# Patient Record
Sex: Female | Born: 1945
Health system: Southern US, Community
[De-identification: ages and names within clinical notes are randomized; demographics above are authoritative.]

## PROBLEM LIST (undated history)

## (undated) DIAGNOSIS — Z923 Personal history of irradiation: Secondary | ICD-10-CM

## (undated) DIAGNOSIS — C73 Malignant neoplasm of thyroid gland: Secondary | ICD-10-CM

## (undated) DIAGNOSIS — B379 Candidiasis, unspecified: Secondary | ICD-10-CM

## (undated) DIAGNOSIS — Z79899 Other long term (current) drug therapy: Secondary | ICD-10-CM

## (undated) DIAGNOSIS — Z7969 Long term (current) use of other immunomodulators and immunosuppressants: Secondary | ICD-10-CM

## (undated) DIAGNOSIS — K603 Anal fistula, unspecified: Secondary | ICD-10-CM

## (undated) DIAGNOSIS — Z87898 Personal history of other specified conditions: Secondary | ICD-10-CM

## (undated) DIAGNOSIS — C3490 Malignant neoplasm of unspecified part of unspecified bronchus or lung: Secondary | ICD-10-CM

## (undated) DIAGNOSIS — Z8639 Personal history of other endocrine, nutritional and metabolic disease: Secondary | ICD-10-CM

## (undated) DIAGNOSIS — M810 Age-related osteoporosis without current pathological fracture: Secondary | ICD-10-CM

## (undated) DIAGNOSIS — C7931 Secondary malignant neoplasm of brain: Secondary | ICD-10-CM

## (undated) DIAGNOSIS — D34 Benign neoplasm of thyroid gland: Secondary | ICD-10-CM

## (undated) DIAGNOSIS — C3411 Malignant neoplasm of upper lobe, right bronchus or lung: Secondary | ICD-10-CM

## (undated) DIAGNOSIS — R51 Headache: Secondary | ICD-10-CM

## (undated) DIAGNOSIS — Z8619 Personal history of other infectious and parasitic diseases: Secondary | ICD-10-CM

## (undated) DIAGNOSIS — Z9221 Personal history of antineoplastic chemotherapy: Secondary | ICD-10-CM

## (undated) DIAGNOSIS — C779 Secondary and unspecified malignant neoplasm of lymph node, unspecified: Secondary | ICD-10-CM

## (undated) DIAGNOSIS — N952 Postmenopausal atrophic vaginitis: Secondary | ICD-10-CM

## (undated) DIAGNOSIS — R011 Cardiac murmur, unspecified: Secondary | ICD-10-CM

## (undated) DIAGNOSIS — R0602 Shortness of breath: Secondary | ICD-10-CM

## (undated) DIAGNOSIS — M1990 Unspecified osteoarthritis, unspecified site: Secondary | ICD-10-CM

## (undated) DIAGNOSIS — Z8739 Personal history of other diseases of the musculoskeletal system and connective tissue: Secondary | ICD-10-CM

## (undated) DIAGNOSIS — R911 Solitary pulmonary nodule: Secondary | ICD-10-CM

## (undated) DIAGNOSIS — IMO0002 Reserved for concepts with insufficient information to code with codable children: Secondary | ICD-10-CM

## (undated) DIAGNOSIS — C7800 Secondary malignant neoplasm of unspecified lung: Secondary | ICD-10-CM

## (undated) DIAGNOSIS — F419 Anxiety disorder, unspecified: Secondary | ICD-10-CM

## (undated) DIAGNOSIS — F0390 Unspecified dementia without behavioral disturbance: Secondary | ICD-10-CM

## (undated) DIAGNOSIS — H269 Unspecified cataract: Secondary | ICD-10-CM

## (undated) DIAGNOSIS — S82899A Other fracture of unspecified lower leg, initial encounter for closed fracture: Secondary | ICD-10-CM

## (undated) DIAGNOSIS — I10 Essential (primary) hypertension: Secondary | ICD-10-CM

## (undated) HISTORY — DX: Anal fistula, unspecified: K60.30

## (undated) HISTORY — PX: LUNG LOBECTOMY: SHX167

## (undated) HISTORY — DX: Personal history of other diseases of the musculoskeletal system and connective tissue: Z87.39

## (undated) HISTORY — DX: Headache: R51

## (undated) HISTORY — DX: Anal fistula: K60.3

## (undated) HISTORY — DX: Candidiasis, unspecified: B37.9

## (undated) HISTORY — DX: Malignant neoplasm of upper lobe, right bronchus or lung: C34.11

## (undated) HISTORY — PX: NECK SURGERY: SHX720

## (undated) HISTORY — DX: Reserved for concepts with insufficient information to code with codable children: IMO0002

## (undated) HISTORY — DX: Personal history of irradiation: Z92.3

## (undated) HISTORY — PX: BRAIN SURGERY: SHX531

## (undated) HISTORY — DX: Personal history of other specified conditions: Z87.898

## (undated) HISTORY — DX: Postmenopausal atrophic vaginitis: N95.2

## (undated) HISTORY — DX: Personal history of other infectious and parasitic diseases: Z86.19

## (undated) HISTORY — DX: Unspecified dementia without behavioral disturbance: F03.90

## (undated) HISTORY — DX: Personal history of other endocrine, nutritional and metabolic disease: Z86.39

## (undated) HISTORY — DX: Essential (primary) hypertension: I10

## (undated) HISTORY — DX: Age-related osteoporosis without current pathological fracture: M81.0

## (undated) SURGERY — OPEN REDUCTION INTERNAL FIXATION (ORIF) ANKLE FRACTURE
Anesthesia: Choice | Laterality: Left

---

## 1998-02-22 ENCOUNTER — Ambulatory Visit (HOSPITAL_COMMUNITY): Admission: RE | Admit: 1998-02-22 | Discharge: 1998-02-22 | Payer: Self-pay | Admitting: Family Medicine

## 1998-03-09 ENCOUNTER — Ambulatory Visit (HOSPITAL_COMMUNITY): Admission: RE | Admit: 1998-03-09 | Discharge: 1998-03-09 | Payer: Self-pay | Admitting: Family Medicine

## 2000-09-23 ENCOUNTER — Encounter: Admission: RE | Admit: 2000-09-23 | Discharge: 2000-09-23 | Payer: Self-pay | Admitting: General Practice

## 2000-09-23 ENCOUNTER — Encounter: Payer: Self-pay | Admitting: General Practice

## 2001-02-11 ENCOUNTER — Other Ambulatory Visit: Admission: RE | Admit: 2001-02-11 | Discharge: 2001-02-11 | Payer: Self-pay | Admitting: *Deleted

## 2001-02-11 ENCOUNTER — Encounter (INDEPENDENT_AMBULATORY_CARE_PROVIDER_SITE_OTHER): Payer: Self-pay | Admitting: *Deleted

## 2001-02-18 ENCOUNTER — Ambulatory Visit (HOSPITAL_COMMUNITY): Admission: RE | Admit: 2001-02-18 | Discharge: 2001-02-18 | Payer: Self-pay | Admitting: *Deleted

## 2001-02-18 ENCOUNTER — Encounter: Payer: Self-pay | Admitting: *Deleted

## 2001-05-08 ENCOUNTER — Ambulatory Visit (HOSPITAL_COMMUNITY): Admission: RE | Admit: 2001-05-08 | Discharge: 2001-05-08 | Payer: Self-pay | Admitting: Internal Medicine

## 2001-05-08 ENCOUNTER — Encounter: Payer: Self-pay | Admitting: Internal Medicine

## 2001-05-14 ENCOUNTER — Encounter: Payer: Self-pay | Admitting: Internal Medicine

## 2001-05-14 ENCOUNTER — Ambulatory Visit (HOSPITAL_COMMUNITY): Admission: RE | Admit: 2001-05-14 | Discharge: 2001-05-14 | Payer: Self-pay | Admitting: Internal Medicine

## 2001-05-14 ENCOUNTER — Encounter (INDEPENDENT_AMBULATORY_CARE_PROVIDER_SITE_OTHER): Payer: Self-pay | Admitting: Specialist

## 2001-05-20 HISTORY — PX: OTHER SURGICAL HISTORY: SHX169

## 2001-05-27 ENCOUNTER — Encounter: Payer: Self-pay | Admitting: Thoracic Surgery

## 2001-05-29 ENCOUNTER — Encounter (INDEPENDENT_AMBULATORY_CARE_PROVIDER_SITE_OTHER): Payer: Self-pay | Admitting: Specialist

## 2001-05-29 ENCOUNTER — Encounter: Payer: Self-pay | Admitting: Thoracic Surgery

## 2001-05-29 ENCOUNTER — Inpatient Hospital Stay (HOSPITAL_COMMUNITY): Admission: RE | Admit: 2001-05-29 | Discharge: 2001-06-04 | Payer: Self-pay | Admitting: Thoracic Surgery

## 2001-05-30 ENCOUNTER — Encounter: Payer: Self-pay | Admitting: Thoracic Surgery

## 2001-05-31 ENCOUNTER — Encounter: Payer: Self-pay | Admitting: Thoracic Surgery

## 2001-06-01 ENCOUNTER — Encounter: Payer: Self-pay | Admitting: Thoracic Surgery

## 2001-06-02 ENCOUNTER — Encounter: Payer: Self-pay | Admitting: Thoracic Surgery

## 2001-06-03 ENCOUNTER — Encounter: Payer: Self-pay | Admitting: Thoracic Surgery

## 2001-06-04 ENCOUNTER — Encounter: Payer: Self-pay | Admitting: Thoracic Surgery

## 2001-06-05 ENCOUNTER — Ambulatory Visit: Admission: RE | Admit: 2001-06-05 | Discharge: 2001-09-03 | Payer: Self-pay | Admitting: *Deleted

## 2001-06-10 ENCOUNTER — Encounter: Payer: Self-pay | Admitting: Thoracic Surgery

## 2001-06-10 ENCOUNTER — Encounter: Admission: RE | Admit: 2001-06-10 | Discharge: 2001-06-10 | Payer: Self-pay | Admitting: Thoracic Surgery

## 2001-07-01 ENCOUNTER — Encounter: Payer: Self-pay | Admitting: Thoracic Surgery

## 2001-07-01 ENCOUNTER — Encounter: Admission: RE | Admit: 2001-07-01 | Discharge: 2001-07-01 | Payer: Self-pay | Admitting: Thoracic Surgery

## 2001-07-29 ENCOUNTER — Encounter: Admission: RE | Admit: 2001-07-29 | Discharge: 2001-07-29 | Payer: Self-pay | Admitting: Thoracic Surgery

## 2001-07-29 ENCOUNTER — Encounter: Payer: Self-pay | Admitting: Thoracic Surgery

## 2001-08-20 DIAGNOSIS — Z923 Personal history of irradiation: Secondary | ICD-10-CM

## 2001-08-20 DIAGNOSIS — Z9221 Personal history of antineoplastic chemotherapy: Secondary | ICD-10-CM

## 2001-08-20 HISTORY — DX: Personal history of irradiation: Z92.3

## 2001-08-20 HISTORY — DX: Personal history of antineoplastic chemotherapy: Z92.21

## 2001-11-27 ENCOUNTER — Encounter: Payer: Self-pay | Admitting: Oncology

## 2001-11-27 ENCOUNTER — Ambulatory Visit (HOSPITAL_COMMUNITY): Admission: RE | Admit: 2001-11-27 | Discharge: 2001-11-27 | Payer: Self-pay | Admitting: Oncology

## 2002-01-15 ENCOUNTER — Ambulatory Visit (HOSPITAL_COMMUNITY): Admission: RE | Admit: 2002-01-15 | Discharge: 2002-01-15 | Payer: Self-pay | Admitting: Oncology

## 2002-01-15 ENCOUNTER — Encounter: Payer: Self-pay | Admitting: Oncology

## 2002-03-26 ENCOUNTER — Encounter: Payer: Self-pay | Admitting: Thoracic Surgery

## 2002-03-26 ENCOUNTER — Encounter: Admission: RE | Admit: 2002-03-26 | Discharge: 2002-03-26 | Payer: Self-pay | Admitting: Thoracic Surgery

## 2002-07-10 ENCOUNTER — Encounter: Admission: RE | Admit: 2002-07-10 | Discharge: 2002-07-10 | Payer: Self-pay | Admitting: Internal Medicine

## 2002-07-10 ENCOUNTER — Encounter: Payer: Self-pay | Admitting: Internal Medicine

## 2002-09-02 ENCOUNTER — Encounter: Payer: Self-pay | Admitting: Obstetrics and Gynecology

## 2002-09-02 ENCOUNTER — Ambulatory Visit (HOSPITAL_COMMUNITY): Admission: RE | Admit: 2002-09-02 | Discharge: 2002-09-02 | Payer: Self-pay | Admitting: Obstetrics and Gynecology

## 2002-09-28 ENCOUNTER — Other Ambulatory Visit: Admission: RE | Admit: 2002-09-28 | Discharge: 2002-09-28 | Payer: Self-pay | Admitting: Obstetrics and Gynecology

## 2002-12-04 ENCOUNTER — Encounter: Payer: Self-pay | Admitting: Thoracic Surgery

## 2002-12-04 ENCOUNTER — Encounter: Admission: RE | Admit: 2002-12-04 | Discharge: 2002-12-04 | Payer: Self-pay | Admitting: General Practice

## 2002-12-04 ENCOUNTER — Encounter: Admission: RE | Admit: 2002-12-04 | Discharge: 2002-12-04 | Payer: Self-pay | Admitting: Thoracic Surgery

## 2002-12-04 ENCOUNTER — Encounter: Payer: Self-pay | Admitting: General Practice

## 2004-02-02 ENCOUNTER — Other Ambulatory Visit: Admission: RE | Admit: 2004-02-02 | Discharge: 2004-02-02 | Payer: Self-pay | Admitting: Obstetrics and Gynecology

## 2004-02-04 ENCOUNTER — Ambulatory Visit (HOSPITAL_COMMUNITY): Admission: RE | Admit: 2004-02-04 | Discharge: 2004-02-04 | Payer: Self-pay | Admitting: Obstetrics and Gynecology

## 2004-07-05 IMAGING — CT CT CHEST W/ CM
1 of 2 series · 14 of 30 positions shown, 18 images · IV contrast (OMNIPAQUE [ID])
Comparison: none

FINDINGS
CLINICAL DATA: LUNG CANCER STATUS POST THORACOTOMY, CHEMOTHERAPY, AND RADIATION THERAPY.  ABNORMAL
CHEST RADIOGRAPH.  CON - V14.8.
CT OF THE CHEST WITH CONTRAST
MULTIDETECTOR HELICAL CT IMAGING WAS PERFORMED DURING THE IV BOLUS INJECTION OF 100 CC OMNIPAQUE
300.  CORRELATION IS MADE WITH THE PRIOR STUDY DONE 03/26/02.
THORACOTOMY CHANGES ON THE LEFT APPEAR STABLE STATUS POST LEFT LOWER LOBE RESECTION.  THERE IS
STABLE VOLUME LOSS IN THE LEFT HEMITHORAX AND A STABLE LOCULATED PLEURAL EFFUSION POSTEROMEDIALLY.
THERE IS NO EVIDENCE OF HILAR OR MEDIASTINAL ADENOPATHY.  THERE IS NO RIGHT PLEURAL EFFUSION OR
PERICARDIAL EFFUSION.  ON THE LUNG WINDOWS, THERE IS STABLE RADIATION CHANGE IN THE MEDIAL LEFT
LUNG.  NO PULMONARY NODULES OR ENDOBRONCHIAL LESIONS ARE DEMONSTRATED. THERE ARE NO SUSPICIOUS
OSSEOUS LESIONS.  IMAGES THROUGH THE UPPER ABDOMEN DEMONSTRATE STABLE ELEVATION OF THE LEFT
HEMIDIAPHRAGM.  THERE IS NO ADRENAL MASS OR ABNORMALITY OF THE VISUALIZED LIVER. THERE IS A SMALL
HIATAL HERNIA.
IMPRESSION
STABLE CT OF THE CHEST STATUS POST LEFT THORACOTOMY AND RADIATION THERAPY.   THERE IS STABLE VOLUME
LOSS AND PLEURAL THICKENING/LOCULATED PLEURAL EFFUSION ON THE LEFT.  THESE FINDINGS ARE LIKELY DUE
TO PREVIOUS RADIATION THERAPY, AND THERE IS NO EVIDENCE OF TUMOR RECURRENCE.

[Series 2: — · axial · 0.66mm/px · z∈[-291,-11]mm · 14 of 66 slices shown, 18 images]
[im 5/66  mediastinal]
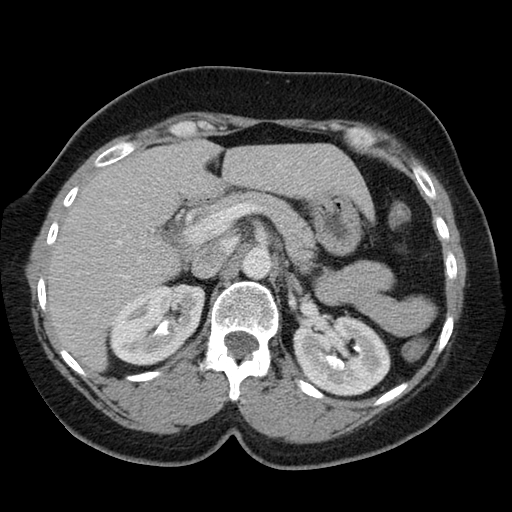
[im 5/66  lung]
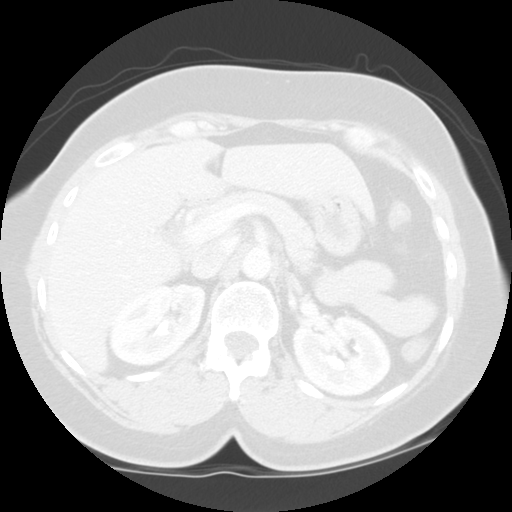
[im 10/66  lung]
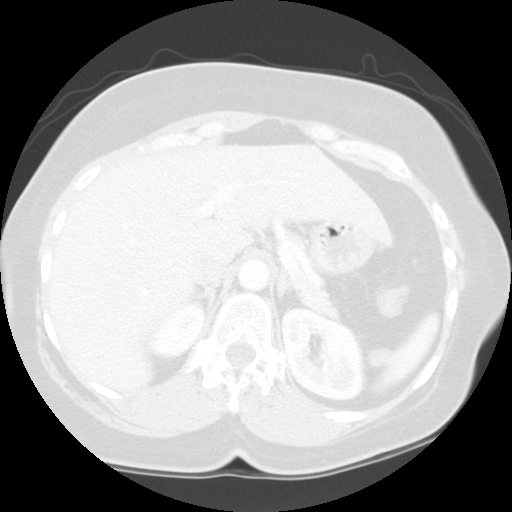
[im 14/66  lung]
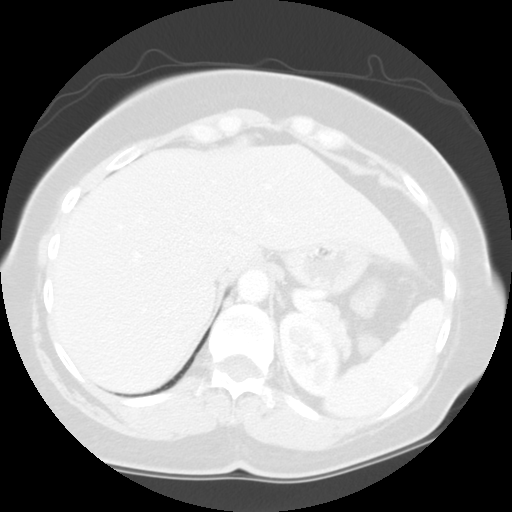
[im 19/66  lung]
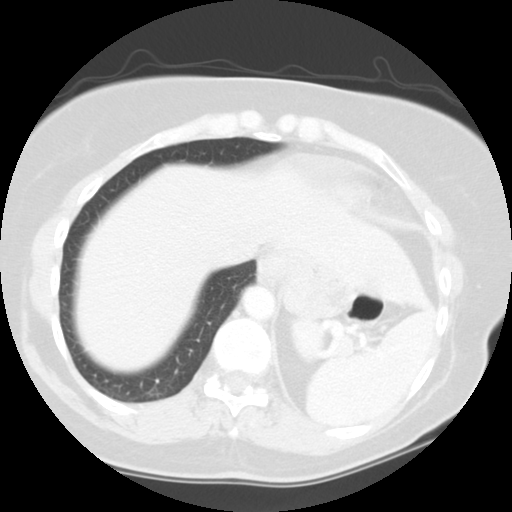
[im 24/66  mediastinal]
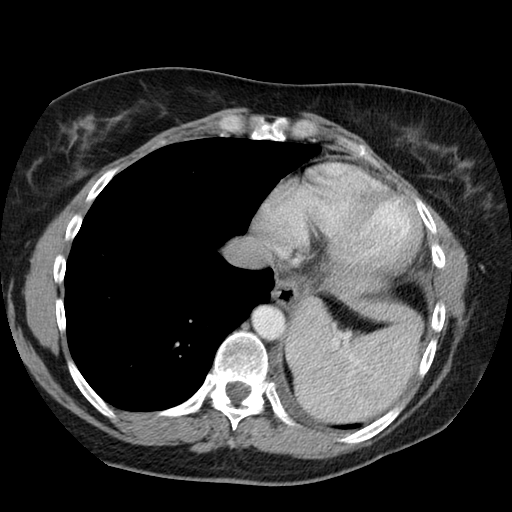
[im 24/66  lung]
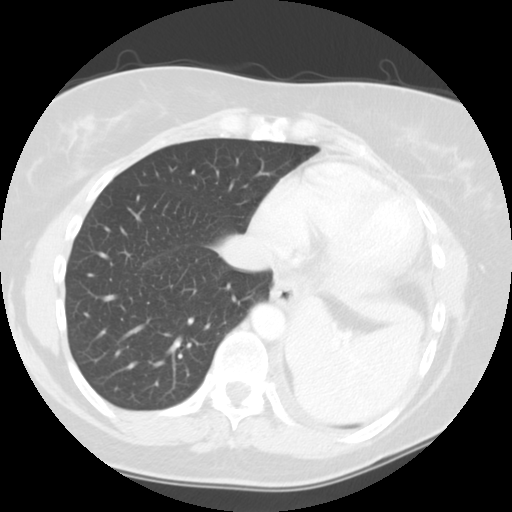
[im 28/66  lung]
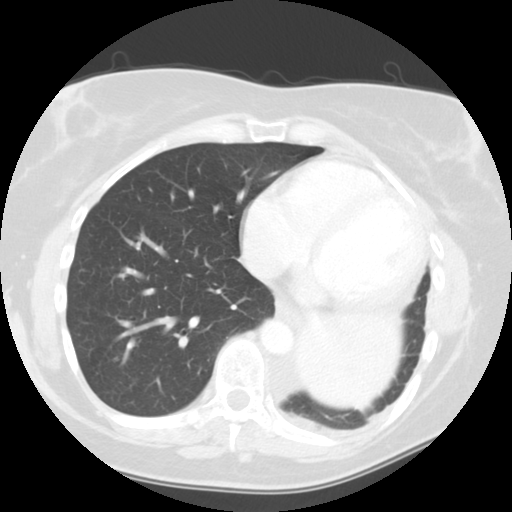
[im 32/66  lung]
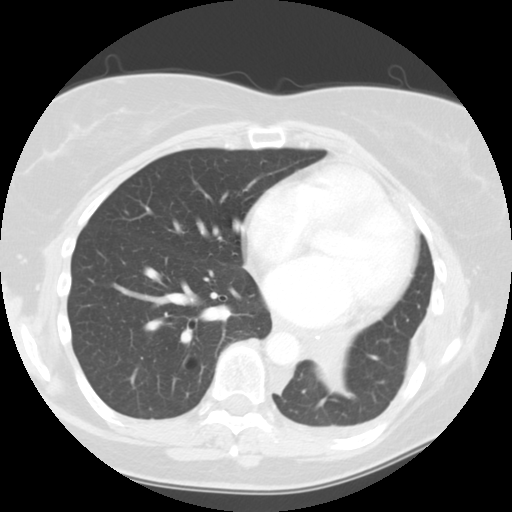
[im 33/66  lung]
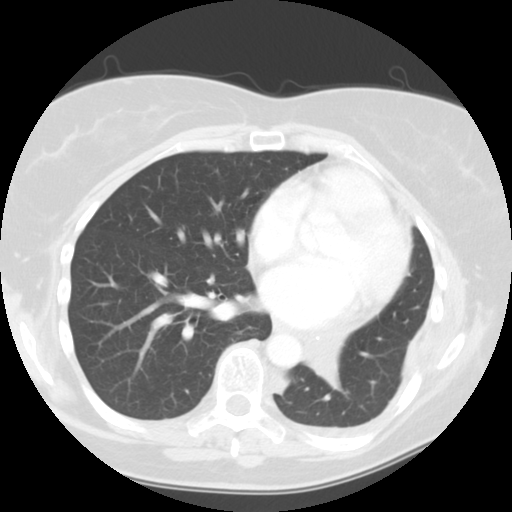
[im 38/66  mediastinal]
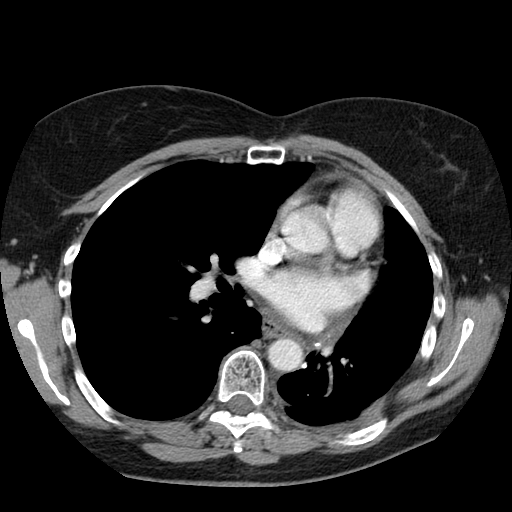
[im 38/66  lung]
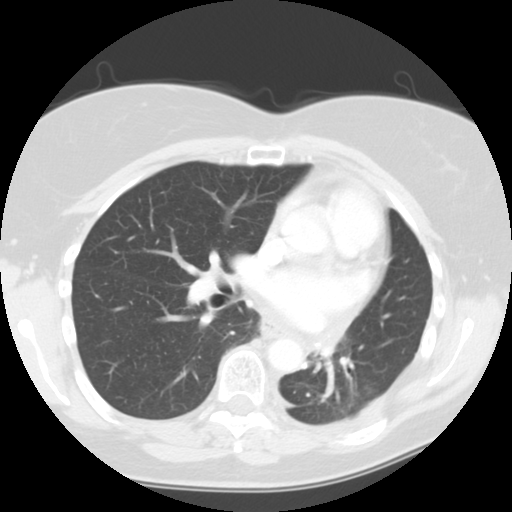
[im 42/66  lung]
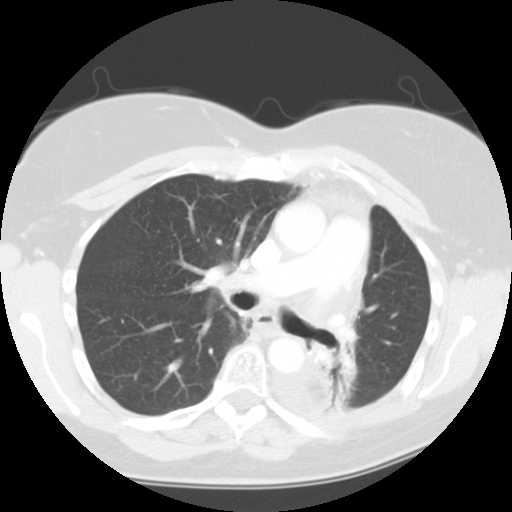
[im 47/66  lung]
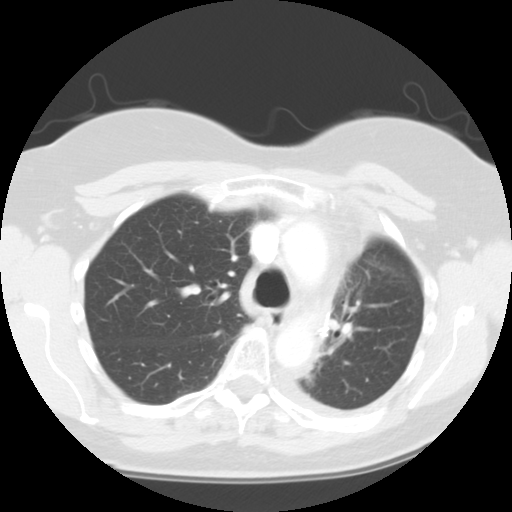
[im 52/66  lung]
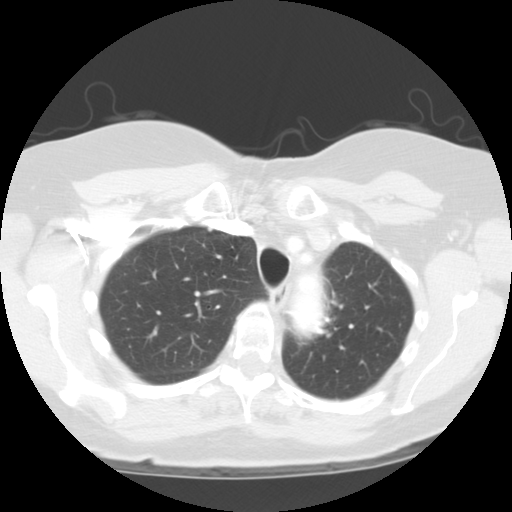
[im 56/66  mediastinal]
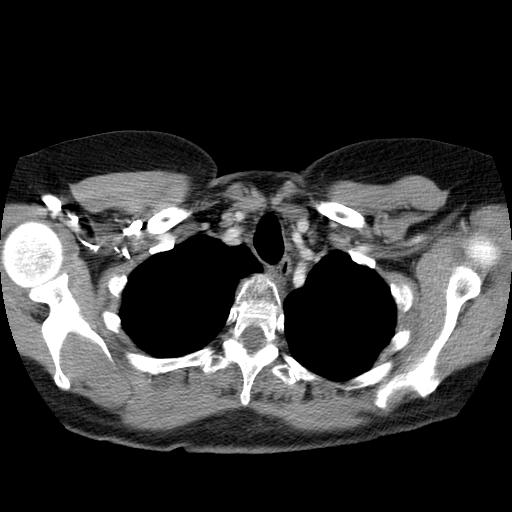
[im 56/66  lung]
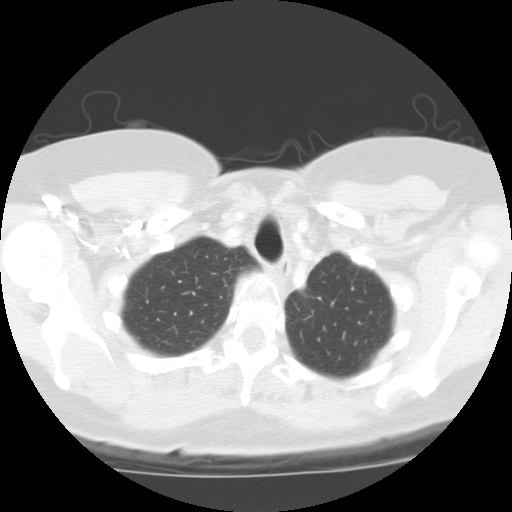
[im 61/66  lung]
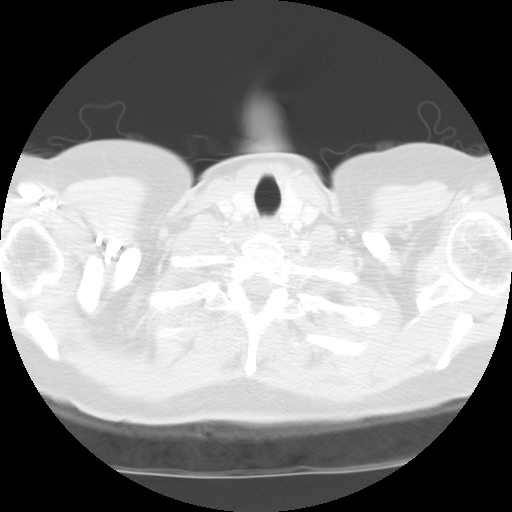

[14 of 30 positions shown; findings below may reference images not displayed]

## 2004-08-15 ENCOUNTER — Ambulatory Visit (HOSPITAL_BASED_OUTPATIENT_CLINIC_OR_DEPARTMENT_OTHER): Admission: RE | Admit: 2004-08-15 | Discharge: 2004-08-15 | Payer: Self-pay | Admitting: General Surgery

## 2004-08-15 ENCOUNTER — Encounter (INDEPENDENT_AMBULATORY_CARE_PROVIDER_SITE_OTHER): Payer: Self-pay | Admitting: *Deleted

## 2004-08-15 ENCOUNTER — Ambulatory Visit (HOSPITAL_COMMUNITY): Admission: RE | Admit: 2004-08-15 | Discharge: 2004-08-15 | Payer: Self-pay | Admitting: General Surgery

## 2004-08-20 DIAGNOSIS — IMO0002 Reserved for concepts with insufficient information to code with codable children: Secondary | ICD-10-CM

## 2004-08-20 DIAGNOSIS — R87619 Unspecified abnormal cytological findings in specimens from cervix uteri: Secondary | ICD-10-CM

## 2004-08-20 HISTORY — DX: Reserved for concepts with insufficient information to code with codable children: IMO0002

## 2004-08-20 HISTORY — DX: Unspecified abnormal cytological findings in specimens from cervix uteri: R87.619

## 2005-03-06 ENCOUNTER — Ambulatory Visit (HOSPITAL_COMMUNITY): Admission: RE | Admit: 2005-03-06 | Discharge: 2005-03-06 | Payer: Self-pay | Admitting: Obstetrics and Gynecology

## 2005-03-27 ENCOUNTER — Ambulatory Visit: Payer: Self-pay | Admitting: Internal Medicine

## 2005-04-03 ENCOUNTER — Ambulatory Visit: Payer: Self-pay | Admitting: Internal Medicine

## 2005-04-04 ENCOUNTER — Ambulatory Visit: Payer: Self-pay | Admitting: Gastroenterology

## 2005-04-25 ENCOUNTER — Ambulatory Visit: Payer: Self-pay | Admitting: Gastroenterology

## 2005-08-28 ENCOUNTER — Ambulatory Visit: Payer: Self-pay | Admitting: Internal Medicine

## 2005-08-30 ENCOUNTER — Other Ambulatory Visit: Admission: RE | Admit: 2005-08-30 | Discharge: 2005-08-30 | Payer: Self-pay | Admitting: Obstetrics and Gynecology

## 2005-09-17 ENCOUNTER — Ambulatory Visit (HOSPITAL_COMMUNITY): Admission: RE | Admit: 2005-09-17 | Discharge: 2005-09-17 | Payer: Self-pay | Admitting: Internal Medicine

## 2005-09-18 ENCOUNTER — Ambulatory Visit (HOSPITAL_COMMUNITY): Admission: RE | Admit: 2005-09-18 | Discharge: 2005-09-18 | Payer: Self-pay | Admitting: Internal Medicine

## 2005-09-21 ENCOUNTER — Encounter (INDEPENDENT_AMBULATORY_CARE_PROVIDER_SITE_OTHER): Payer: Self-pay | Admitting: Specialist

## 2005-09-21 ENCOUNTER — Inpatient Hospital Stay (HOSPITAL_COMMUNITY): Admission: RE | Admit: 2005-09-21 | Discharge: 2005-09-24 | Payer: Self-pay | Admitting: Neurosurgery

## 2005-09-21 HISTORY — PX: OTHER SURGICAL HISTORY: SHX169

## 2005-09-24 ENCOUNTER — Ambulatory Visit: Admission: RE | Admit: 2005-09-24 | Discharge: 2005-11-09 | Payer: Self-pay | Admitting: *Deleted

## 2005-10-16 ENCOUNTER — Ambulatory Visit: Payer: Self-pay | Admitting: Internal Medicine

## 2005-11-22 ENCOUNTER — Ambulatory Visit (HOSPITAL_COMMUNITY): Admission: RE | Admit: 2005-11-22 | Discharge: 2005-11-22 | Payer: Self-pay | Admitting: Internal Medicine

## 2005-11-29 ENCOUNTER — Ambulatory Visit: Payer: Self-pay | Admitting: Internal Medicine

## 2005-11-29 LAB — CBC WITH DIFFERENTIAL/PLATELET
BASO%: 0.3 % (ref 0.0–2.0)
Basophils Absolute: 0 10e3/uL (ref 0.0–0.1)
EOS%: 0 % (ref 0.0–7.0)
Eosinophils Absolute: 0 10e3/uL (ref 0.0–0.5)
HCT: 41.8 % (ref 34.8–46.6)
HGB: 14.2 g/dL (ref 11.6–15.9)
LYMPH%: 15.2 % (ref 14.0–48.0)
MCH: 30.4 pg (ref 26.0–34.0)
MCHC: 34 g/dL (ref 32.0–36.0)
MCV: 89.3 fL (ref 81.0–101.0)
MONO#: 0.4 10e3/uL (ref 0.1–0.9)
MONO%: 10.1 % (ref 0.0–13.0)
NEUT#: 3.3 10e3/uL (ref 1.5–6.5)
NEUT%: 74.4 % (ref 39.6–76.8)
Platelets: 340 10e3/uL (ref 145–400)
RBC: 4.68 10e6/uL (ref 3.70–5.32)
RDW: 14.1 % (ref 11.3–14.5)
WBC: 4.4 10e3/uL (ref 3.9–10.0)
lymph#: 0.7 10e3/uL — ABNORMAL LOW (ref 0.9–3.3)

## 2005-11-29 LAB — COMPREHENSIVE METABOLIC PANEL WITH GFR
ALT: 8 U/L (ref 0–40)
AST: 11 U/L (ref 0–37)
Albumin: 4.6 g/dL (ref 3.5–5.2)
Alkaline Phosphatase: 69 U/L (ref 39–117)
BUN: 8 mg/dL (ref 6–23)
CO2: 23 meq/L (ref 19–32)
Calcium: 9.8 mg/dL (ref 8.4–10.5)
Chloride: 100 meq/L (ref 96–112)
Creatinine, Ser: 0.8 mg/dL (ref 0.4–1.2)
Glucose, Bld: 121 mg/dL — ABNORMAL HIGH (ref 70–99)
Potassium: 3.5 meq/L (ref 3.5–5.3)
Sodium: 136 meq/L (ref 135–145)
Total Bilirubin: 0.8 mg/dL (ref 0.3–1.2)
Total Protein: 7.1 g/dL (ref 6.0–8.3)

## 2006-02-13 ENCOUNTER — Ambulatory Visit: Payer: Self-pay | Admitting: Internal Medicine

## 2006-02-22 ENCOUNTER — Ambulatory Visit (HOSPITAL_COMMUNITY): Admission: RE | Admit: 2006-02-22 | Discharge: 2006-02-22 | Payer: Self-pay | Admitting: General Surgery

## 2006-02-25 ENCOUNTER — Ambulatory Visit (HOSPITAL_COMMUNITY): Admission: RE | Admit: 2006-02-25 | Discharge: 2006-02-25 | Payer: Self-pay | Admitting: Internal Medicine

## 2006-02-28 LAB — COMPREHENSIVE METABOLIC PANEL
ALT: 8 U/L (ref 0–40)
AST: 12 U/L (ref 0–37)
Albumin: 4.4 g/dL (ref 3.5–5.2)
Alkaline Phosphatase: 103 U/L (ref 39–117)
BUN: 16 mg/dL (ref 6–23)
CO2: 26 mEq/L (ref 19–32)
Calcium: 9.4 mg/dL (ref 8.4–10.5)
Chloride: 103 mEq/L (ref 96–112)
Creatinine, Ser: 0.81 mg/dL (ref 0.40–1.20)
Glucose, Bld: 146 mg/dL — ABNORMAL HIGH (ref 70–99)
Potassium: 4.4 mEq/L (ref 3.5–5.3)
Sodium: 138 mEq/L (ref 135–145)
Total Bilirubin: 0.5 mg/dL (ref 0.3–1.2)
Total Protein: 6.6 g/dL (ref 6.0–8.3)

## 2006-02-28 LAB — CBC WITH DIFFERENTIAL/PLATELET
BASO%: 0.4 % (ref 0.0–2.0)
Basophils Absolute: 0 10*3/uL (ref 0.0–0.1)
EOS%: 0.7 % (ref 0.0–7.0)
Eosinophils Absolute: 0 10*3/uL (ref 0.0–0.5)
HCT: 38.2 % (ref 34.8–46.6)
HGB: 13 g/dL (ref 11.6–15.9)
LYMPH%: 12.7 % — ABNORMAL LOW (ref 14.0–48.0)
MCH: 30.4 pg (ref 26.0–34.0)
MCHC: 34.1 g/dL (ref 32.0–36.0)
MCV: 89.2 fL (ref 81.0–101.0)
MONO#: 0.4 10*3/uL (ref 0.1–0.9)
MONO%: 7.7 % (ref 0.0–13.0)
NEUT#: 4.5 10*3/uL (ref 1.5–6.5)
NEUT%: 78.5 % — ABNORMAL HIGH (ref 39.6–76.8)
Platelets: 253 10*3/uL (ref 145–400)
RBC: 4.28 10*6/uL (ref 3.70–5.32)
RDW: 13.8 % (ref 11.3–14.5)
WBC: 5.8 10*3/uL (ref 3.9–10.0)
lymph#: 0.7 10*3/uL — ABNORMAL LOW (ref 0.9–3.3)

## 2006-07-01 ENCOUNTER — Ambulatory Visit: Payer: Self-pay | Admitting: Internal Medicine

## 2006-07-22 LAB — CBC WITH DIFFERENTIAL/PLATELET
BASO%: 0.5 % (ref 0.0–2.0)
Basophils Absolute: 0 10*3/uL (ref 0.0–0.1)
EOS%: 1 % (ref 0.0–7.0)
Eosinophils Absolute: 0 10*3/uL (ref 0.0–0.5)
HCT: 39.3 % (ref 34.8–46.6)
HGB: 13.3 g/dL (ref 11.6–15.9)
LYMPH%: 21 % (ref 14.0–48.0)
MCH: 30 pg (ref 26.0–34.0)
MCHC: 33.9 g/dL (ref 32.0–36.0)
MCV: 88.7 fL (ref 81.0–101.0)
MONO#: 0.4 10*3/uL (ref 0.1–0.9)
MONO%: 8.6 % (ref 0.0–13.0)
NEUT#: 3.4 10*3/uL (ref 1.5–6.5)
NEUT%: 68.9 % (ref 39.6–76.8)
Platelets: 254 10*3/uL (ref 145–400)
RBC: 4.44 10*6/uL (ref 3.70–5.32)
RDW: 13.5 % (ref 11.3–14.5)
WBC: 4.9 10*3/uL (ref 3.9–10.0)
lymph#: 1 10*3/uL (ref 0.9–3.3)

## 2006-07-22 LAB — COMPREHENSIVE METABOLIC PANEL
ALT: 11 U/L (ref 0–35)
AST: 12 U/L (ref 0–37)
Albumin: 4.7 g/dL (ref 3.5–5.2)
Alkaline Phosphatase: 106 U/L (ref 39–117)
BUN: 14 mg/dL (ref 6–23)
CO2: 25 mEq/L (ref 19–32)
Calcium: 9.3 mg/dL (ref 8.4–10.5)
Chloride: 103 mEq/L (ref 96–112)
Creatinine, Ser: 0.76 mg/dL (ref 0.40–1.20)
Glucose, Bld: 88 mg/dL (ref 70–99)
Potassium: 3.9 mEq/L (ref 3.5–5.3)
Sodium: 139 mEq/L (ref 135–145)
Total Bilirubin: 0.6 mg/dL (ref 0.3–1.2)
Total Protein: 6.9 g/dL (ref 6.0–8.3)

## 2006-07-30 ENCOUNTER — Ambulatory Visit (HOSPITAL_COMMUNITY): Admission: RE | Admit: 2006-07-30 | Discharge: 2006-07-30 | Payer: Self-pay | Admitting: Internal Medicine

## 2006-08-20 DIAGNOSIS — N952 Postmenopausal atrophic vaginitis: Secondary | ICD-10-CM

## 2006-08-20 DIAGNOSIS — IMO0002 Reserved for concepts with insufficient information to code with codable children: Secondary | ICD-10-CM

## 2006-08-20 HISTORY — DX: Reserved for concepts with insufficient information to code with codable children: IMO0002

## 2006-08-20 HISTORY — DX: Postmenopausal atrophic vaginitis: N95.2

## 2006-10-24 ENCOUNTER — Ambulatory Visit: Payer: Self-pay | Admitting: Internal Medicine

## 2006-10-28 LAB — COMPREHENSIVE METABOLIC PANEL
ALT: 10 U/L (ref 0–35)
AST: 11 U/L (ref 0–37)
Albumin: 4.6 g/dL (ref 3.5–5.2)
Alkaline Phosphatase: 107 U/L (ref 39–117)
BUN: 16 mg/dL (ref 6–23)
CO2: 26 mEq/L (ref 19–32)
Calcium: 9.7 mg/dL (ref 8.4–10.5)
Chloride: 103 mEq/L (ref 96–112)
Creatinine, Ser: 0.85 mg/dL (ref 0.40–1.20)
Glucose, Bld: 127 mg/dL — ABNORMAL HIGH (ref 70–99)
Potassium: 4.2 mEq/L (ref 3.5–5.3)
Sodium: 139 mEq/L (ref 135–145)
Total Bilirubin: 0.4 mg/dL (ref 0.3–1.2)
Total Protein: 6.9 g/dL (ref 6.0–8.3)

## 2006-10-28 LAB — CBC WITH DIFFERENTIAL/PLATELET
BASO%: 0.3 % (ref 0.0–2.0)
Basophils Absolute: 0 10*3/uL (ref 0.0–0.1)
EOS%: 1.3 % (ref 0.0–7.0)
Eosinophils Absolute: 0.1 10*3/uL (ref 0.0–0.5)
HCT: 38.7 % (ref 34.8–46.6)
HGB: 13.4 g/dL (ref 11.6–15.9)
LYMPH%: 20.5 % (ref 14.0–48.0)
MCH: 30.3 pg (ref 26.0–34.0)
MCHC: 34.5 g/dL (ref 32.0–36.0)
MCV: 87.7 fL (ref 81.0–101.0)
MONO#: 0.3 10*3/uL (ref 0.1–0.9)
MONO%: 7.1 % (ref 0.0–13.0)
NEUT#: 3.2 10*3/uL (ref 1.5–6.5)
NEUT%: 70.8 % (ref 39.6–76.8)
Platelets: 230 10*3/uL (ref 145–400)
RBC: 4.42 10*6/uL (ref 3.70–5.32)
RDW: 13.4 % (ref 11.3–14.5)
WBC: 4.5 10*3/uL (ref 3.9–10.0)
lymph#: 0.9 10*3/uL (ref 0.9–3.3)

## 2006-10-30 ENCOUNTER — Ambulatory Visit (HOSPITAL_COMMUNITY): Admission: RE | Admit: 2006-10-30 | Discharge: 2006-10-30 | Payer: Self-pay | Admitting: Internal Medicine

## 2006-12-12 ENCOUNTER — Encounter: Admission: RE | Admit: 2006-12-12 | Discharge: 2006-12-12 | Payer: Self-pay | Admitting: Obstetrics and Gynecology

## 2007-02-20 ENCOUNTER — Ambulatory Visit: Payer: Self-pay | Admitting: Internal Medicine

## 2007-02-25 LAB — COMPREHENSIVE METABOLIC PANEL
ALT: 12 U/L (ref 0–35)
AST: 14 U/L (ref 0–37)
Albumin: 4.4 g/dL (ref 3.5–5.2)
Alkaline Phosphatase: 102 U/L (ref 39–117)
BUN: 12 mg/dL (ref 6–23)
CO2: 23 mEq/L (ref 19–32)
Calcium: 9.7 mg/dL (ref 8.4–10.5)
Chloride: 105 mEq/L (ref 96–112)
Creatinine, Ser: 0.83 mg/dL (ref 0.40–1.20)
Glucose, Bld: 105 mg/dL — ABNORMAL HIGH (ref 70–99)
Potassium: 4 mEq/L (ref 3.5–5.3)
Sodium: 140 mEq/L (ref 135–145)
Total Bilirubin: 0.5 mg/dL (ref 0.3–1.2)
Total Protein: 6.9 g/dL (ref 6.0–8.3)

## 2007-02-25 LAB — CBC WITH DIFFERENTIAL/PLATELET
BASO%: 0.2 % (ref 0.0–2.0)
Basophils Absolute: 0 10*3/uL (ref 0.0–0.1)
EOS%: 1.8 % (ref 0.0–7.0)
Eosinophils Absolute: 0.1 10*3/uL (ref 0.0–0.5)
HCT: 39.3 % (ref 34.8–46.6)
HGB: 13.7 g/dL (ref 11.6–15.9)
LYMPH%: 19.1 % (ref 14.0–48.0)
MCH: 30.7 pg (ref 26.0–34.0)
MCHC: 34.8 g/dL (ref 32.0–36.0)
MCV: 88.3 fL (ref 81.0–101.0)
MONO#: 0.3 10*3/uL (ref 0.1–0.9)
MONO%: 7 % (ref 0.0–13.0)
NEUT#: 3.4 10*3/uL (ref 1.5–6.5)
NEUT%: 71.9 % (ref 39.6–76.8)
Platelets: 216 10*3/uL (ref 145–400)
RBC: 4.45 10*6/uL (ref 3.70–5.32)
RDW: 13.3 % (ref 11.3–14.5)
WBC: 4.8 10*3/uL (ref 3.9–10.0)
lymph#: 0.9 10*3/uL (ref 0.9–3.3)

## 2007-02-26 ENCOUNTER — Encounter: Payer: Self-pay | Admitting: Internal Medicine

## 2007-02-27 ENCOUNTER — Ambulatory Visit (HOSPITAL_COMMUNITY): Admission: RE | Admit: 2007-02-27 | Discharge: 2007-02-27 | Payer: Self-pay | Admitting: Internal Medicine

## 2007-05-22 ENCOUNTER — Ambulatory Visit: Payer: Self-pay | Admitting: Internal Medicine

## 2007-05-26 ENCOUNTER — Ambulatory Visit (HOSPITAL_COMMUNITY): Admission: RE | Admit: 2007-05-26 | Discharge: 2007-05-26 | Payer: Self-pay | Admitting: Internal Medicine

## 2007-05-26 LAB — COMPREHENSIVE METABOLIC PANEL
ALT: 13 U/L (ref 0–35)
AST: 12 U/L (ref 0–37)
Albumin: 4.5 g/dL (ref 3.5–5.2)
Alkaline Phosphatase: 90 U/L (ref 39–117)
BUN: 15 mg/dL (ref 6–23)
CO2: 26 mEq/L (ref 19–32)
Calcium: 9.5 mg/dL (ref 8.4–10.5)
Chloride: 106 mEq/L (ref 96–112)
Creatinine, Ser: 0.91 mg/dL (ref 0.40–1.20)
Glucose, Bld: 125 mg/dL — ABNORMAL HIGH (ref 70–99)
Potassium: 4.3 mEq/L (ref 3.5–5.3)
Sodium: 141 mEq/L (ref 135–145)
Total Bilirubin: 0.6 mg/dL (ref 0.3–1.2)
Total Protein: 6.9 g/dL (ref 6.0–8.3)

## 2007-05-26 LAB — CBC WITH DIFFERENTIAL/PLATELET
BASO%: 0.4 % (ref 0.0–2.0)
Basophils Absolute: 0 10*3/uL (ref 0.0–0.1)
EOS%: 1 % (ref 0.0–7.0)
Eosinophils Absolute: 0.1 10*3/uL (ref 0.0–0.5)
HCT: 38.3 % (ref 34.8–46.6)
HGB: 13.3 g/dL (ref 11.6–15.9)
LYMPH%: 22 % (ref 14.0–48.0)
MCH: 30.7 pg (ref 26.0–34.0)
MCHC: 34.6 g/dL (ref 32.0–36.0)
MCV: 88.8 fL (ref 81.0–101.0)
MONO#: 0.5 10*3/uL (ref 0.1–0.9)
MONO%: 9.2 % (ref 0.0–13.0)
NEUT#: 3.6 10*3/uL (ref 1.5–6.5)
NEUT%: 67.4 % (ref 39.6–76.8)
Platelets: 228 10*3/uL (ref 145–400)
RBC: 4.32 10*6/uL (ref 3.70–5.32)
RDW: 13.6 % (ref 11.3–14.5)
WBC: 5.3 10*3/uL (ref 3.9–10.0)
lymph#: 1.2 10*3/uL (ref 0.9–3.3)

## 2007-06-06 ENCOUNTER — Encounter: Payer: Self-pay | Admitting: Internal Medicine

## 2007-06-12 LAB — COMPREHENSIVE METABOLIC PANEL
ALT: 12 U/L (ref 0–35)
AST: 12 U/L (ref 0–37)
Albumin: 4.6 g/dL (ref 3.5–5.2)
Alkaline Phosphatase: 91 U/L (ref 39–117)
BUN: 14 mg/dL (ref 6–23)
CO2: 23 mEq/L (ref 19–32)
Calcium: 9.9 mg/dL (ref 8.4–10.5)
Chloride: 103 mEq/L (ref 96–112)
Creatinine, Ser: 0.87 mg/dL (ref 0.40–1.20)
Glucose, Bld: 102 mg/dL — ABNORMAL HIGH (ref 70–99)
Potassium: 4 mEq/L (ref 3.5–5.3)
Sodium: 140 mEq/L (ref 135–145)
Total Bilirubin: 1.1 mg/dL (ref 0.3–1.2)
Total Protein: 6.9 g/dL (ref 6.0–8.3)

## 2007-06-12 LAB — CBC WITH DIFFERENTIAL/PLATELET
BASO%: 0.1 % (ref 0.0–2.0)
Basophils Absolute: 0 10*3/uL (ref 0.0–0.1)
EOS%: 0.9 % (ref 0.0–7.0)
Eosinophils Absolute: 0 10*3/uL (ref 0.0–0.5)
HCT: 40.6 % (ref 34.8–46.6)
HGB: 14 g/dL (ref 11.6–15.9)
LYMPH%: 15.9 % (ref 14.0–48.0)
MCH: 30.5 pg (ref 26.0–34.0)
MCHC: 34.5 g/dL (ref 32.0–36.0)
MCV: 88.3 fL (ref 81.0–101.0)
MONO#: 0.4 10*3/uL (ref 0.1–0.9)
MONO%: 7.5 % (ref 0.0–13.0)
NEUT#: 4.4 10*3/uL (ref 1.5–6.5)
NEUT%: 75.6 % (ref 39.6–76.8)
Platelets: 241 10*3/uL (ref 145–400)
RBC: 4.59 10*6/uL (ref 3.70–5.32)
RDW: 13.4 % (ref 11.3–14.5)
WBC: 5.8 10*3/uL (ref 3.9–10.0)
lymph#: 0.9 10*3/uL (ref 0.9–3.3)

## 2007-06-18 ENCOUNTER — Encounter: Admission: RE | Admit: 2007-06-18 | Discharge: 2007-06-18 | Payer: Self-pay | Admitting: General Surgery

## 2007-07-04 LAB — COMPREHENSIVE METABOLIC PANEL
ALT: 13 U/L (ref 0–35)
AST: 13 U/L (ref 0–37)
Albumin: 4.7 g/dL (ref 3.5–5.2)
Alkaline Phosphatase: 96 U/L (ref 39–117)
BUN: 11 mg/dL (ref 6–23)
CO2: 25 mEq/L (ref 19–32)
Calcium: 9.7 mg/dL (ref 8.4–10.5)
Chloride: 106 mEq/L (ref 96–112)
Creatinine, Ser: 0.88 mg/dL (ref 0.40–1.20)
Glucose, Bld: 101 mg/dL — ABNORMAL HIGH (ref 70–99)
Potassium: 4.6 mEq/L (ref 3.5–5.3)
Sodium: 142 mEq/L (ref 135–145)
Total Bilirubin: 1.3 mg/dL — ABNORMAL HIGH (ref 0.3–1.2)
Total Protein: 6.6 g/dL (ref 6.0–8.3)

## 2007-07-04 LAB — CBC WITH DIFFERENTIAL/PLATELET
BASO%: 0.3 % (ref 0.0–2.0)
Basophils Absolute: 0 10*3/uL (ref 0.0–0.1)
EOS%: 1.4 % (ref 0.0–7.0)
Eosinophils Absolute: 0.1 10*3/uL (ref 0.0–0.5)
HCT: 38.2 % (ref 34.8–46.6)
HGB: 13.1 g/dL (ref 11.6–15.9)
LYMPH%: 20.6 % (ref 14.0–48.0)
MCH: 30.5 pg (ref 26.0–34.0)
MCHC: 34.4 g/dL (ref 32.0–36.0)
MCV: 88.7 fL (ref 81.0–101.0)
MONO#: 0.3 10*3/uL (ref 0.1–0.9)
MONO%: 8.6 % (ref 0.0–13.0)
NEUT#: 2.5 10*3/uL (ref 1.5–6.5)
NEUT%: 69.1 % (ref 39.6–76.8)
Platelets: 220 10*3/uL (ref 145–400)
RBC: 4.31 10*6/uL (ref 3.70–5.32)
RDW: 13.7 % (ref 11.3–14.5)
WBC: 3.6 10*3/uL — ABNORMAL LOW (ref 3.9–10.0)
lymph#: 0.7 10*3/uL — ABNORMAL LOW (ref 0.9–3.3)

## 2007-07-31 ENCOUNTER — Ambulatory Visit: Payer: Self-pay | Admitting: Internal Medicine

## 2007-08-04 LAB — COMPREHENSIVE METABOLIC PANEL
ALT: 13 U/L (ref 0–35)
AST: 16 U/L (ref 0–37)
Albumin: 4.8 g/dL (ref 3.5–5.2)
Alkaline Phosphatase: 92 U/L (ref 39–117)
BUN: 10 mg/dL (ref 6–23)
CO2: 24 mEq/L (ref 19–32)
Calcium: 9.9 mg/dL (ref 8.4–10.5)
Chloride: 104 mEq/L (ref 96–112)
Creatinine, Ser: 0.82 mg/dL (ref 0.40–1.20)
Glucose, Bld: 96 mg/dL (ref 70–99)
Potassium: 3.8 mEq/L (ref 3.5–5.3)
Sodium: 141 mEq/L (ref 135–145)
Total Bilirubin: 1.1 mg/dL (ref 0.3–1.2)
Total Protein: 7.3 g/dL (ref 6.0–8.3)

## 2007-08-04 LAB — CBC WITH DIFFERENTIAL/PLATELET
BASO%: 0.6 % (ref 0.0–2.0)
Basophils Absolute: 0 10*3/uL (ref 0.0–0.1)
EOS%: 2.2 % (ref 0.0–7.0)
Eosinophils Absolute: 0.1 10*3/uL (ref 0.0–0.5)
HCT: 39.7 % (ref 34.8–46.6)
HGB: 13.8 g/dL (ref 11.6–15.9)
LYMPH%: 22.1 % (ref 14.0–48.0)
MCH: 30.9 pg (ref 26.0–34.0)
MCHC: 34.7 g/dL (ref 32.0–36.0)
MCV: 89.1 fL (ref 81.0–101.0)
MONO#: 0.3 10*3/uL (ref 0.1–0.9)
MONO%: 8.3 % (ref 0.0–13.0)
NEUT#: 2.5 10*3/uL (ref 1.5–6.5)
NEUT%: 66.8 % (ref 39.6–76.8)
Platelets: 243 10*3/uL (ref 145–400)
RBC: 4.45 10*6/uL (ref 3.70–5.32)
RDW: 13.1 % (ref 11.3–14.5)
WBC: 3.7 10*3/uL — ABNORMAL LOW (ref 3.9–10.0)
lymph#: 0.8 10*3/uL — ABNORMAL LOW (ref 0.9–3.3)

## 2007-08-21 DIAGNOSIS — F0390 Unspecified dementia without behavioral disturbance: Secondary | ICD-10-CM

## 2007-08-21 HISTORY — DX: Unspecified dementia, unspecified severity, without behavioral disturbance, psychotic disturbance, mood disturbance, and anxiety: F03.90

## 2007-08-29 ENCOUNTER — Ambulatory Visit (HOSPITAL_COMMUNITY): Admission: RE | Admit: 2007-08-29 | Discharge: 2007-08-29 | Payer: Self-pay | Admitting: Internal Medicine

## 2007-09-01 LAB — CANCER ANTIGEN 27.29: CA 27.29: 13 U/mL (ref 0–39)

## 2007-09-01 LAB — CBC WITH DIFFERENTIAL/PLATELET
BASO%: 0.5 % (ref 0.0–2.0)
Basophils Absolute: 0 10*3/uL (ref 0.0–0.1)
EOS%: 1.3 % (ref 0.0–7.0)
Eosinophils Absolute: 0.1 10*3/uL (ref 0.0–0.5)
HCT: 40.6 % (ref 34.8–46.6)
HGB: 13.8 g/dL (ref 11.6–15.9)
LYMPH%: 18.7 % (ref 14.0–48.0)
MCH: 30 pg (ref 26.0–34.0)
MCHC: 33.8 g/dL (ref 32.0–36.0)
MCV: 88.6 fL (ref 81.0–101.0)
MONO#: 0.2 10*3/uL (ref 0.1–0.9)
MONO%: 4.7 % (ref 0.0–13.0)
NEUT#: 3.5 10*3/uL (ref 1.5–6.5)
NEUT%: 74.8 % (ref 39.6–76.8)
Platelets: 250 10*3/uL (ref 145–400)
RBC: 4.58 10*6/uL (ref 3.70–5.32)
RDW: 13.2 % (ref 11.3–14.5)
WBC: 4.7 10*3/uL (ref 3.9–10.0)
lymph#: 0.9 10*3/uL (ref 0.9–3.3)

## 2007-09-01 LAB — COMPREHENSIVE METABOLIC PANEL
ALT: 17 U/L (ref 0–35)
AST: 14 U/L (ref 0–37)
Albumin: 4.6 g/dL (ref 3.5–5.2)
Alkaline Phosphatase: 87 U/L (ref 39–117)
BUN: 12 mg/dL (ref 6–23)
CO2: 23 mEq/L (ref 19–32)
Calcium: 10 mg/dL (ref 8.4–10.5)
Chloride: 106 mEq/L (ref 96–112)
Creatinine, Ser: 0.84 mg/dL (ref 0.40–1.20)
Glucose, Bld: 117 mg/dL — ABNORMAL HIGH (ref 70–99)
Potassium: 3.9 mEq/L (ref 3.5–5.3)
Sodium: 142 mEq/L (ref 135–145)
Total Bilirubin: 0.9 mg/dL (ref 0.3–1.2)
Total Protein: 7 g/dL (ref 6.0–8.3)

## 2007-09-01 LAB — LACTATE DEHYDROGENASE: LDH: 153 U/L (ref 94–250)

## 2007-09-13 IMAGING — CT CT HEAD WO/W CM
3 of 4 series · 15 of 30 positions shown, 18 images · IV contrast (omnipaque)
Comparison: None.
COMPARISON: 07/16/05 from [facility]

CLINICAL DATA: 59 year old female with previous left lung carcinoma, status post resection and left thoracotomy.  Recent worsening memory loss.  
HEAD CT WITHOUT AND WITH CONTRAST ? 09/17/05:
TECHNIQUE: Contiguous axial CT images were obtained from the base of the skull through the vertex according to standard protocol before and after administration of intravenous contrast.
Contrast:  100 cc Omnipaque 300 IV.
TECHNIQUE: Multidetector CT imaging of the chest was performed following the standard protocol during bolus administration of intravenous contrast.

[Series 2: head_seq 5.0 h45s · axial · 0.43mm/px · z∈[+1074,+1144]mm · 3 of 30 slices shown (1 of 2)]
[im 8/30  brain]
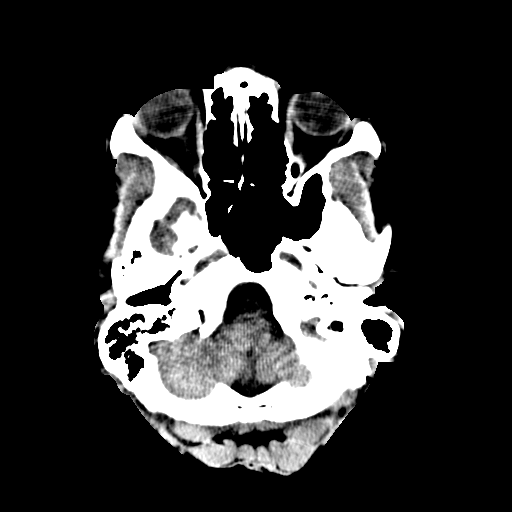
[im 15/30  brain]
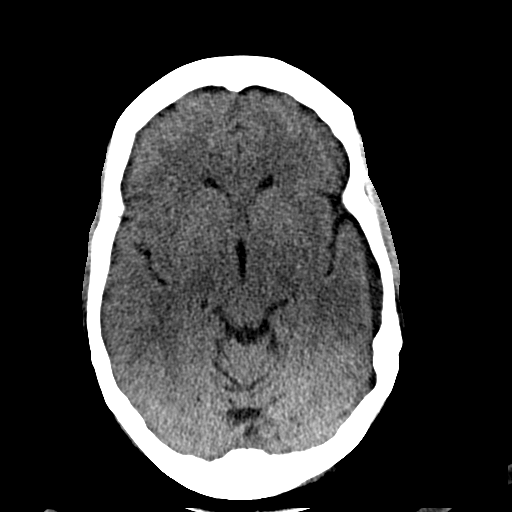
[im 22/30  brain]
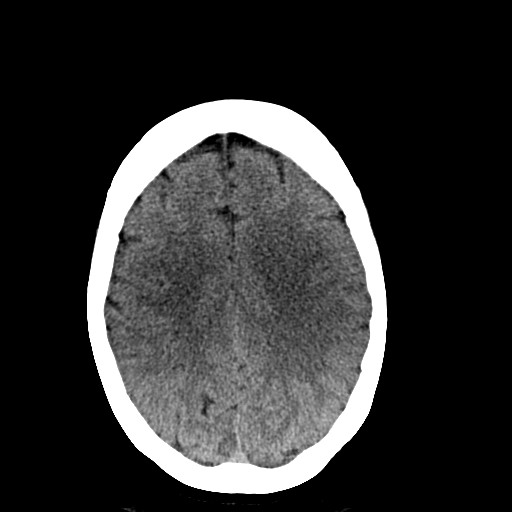

[Series 5: chest routine 5.0 b30f · axial · 0.60mm/px · z∈[+662,+928]mm · 9 of 67 slices shown, 12 images]
[im 7/67  brain]
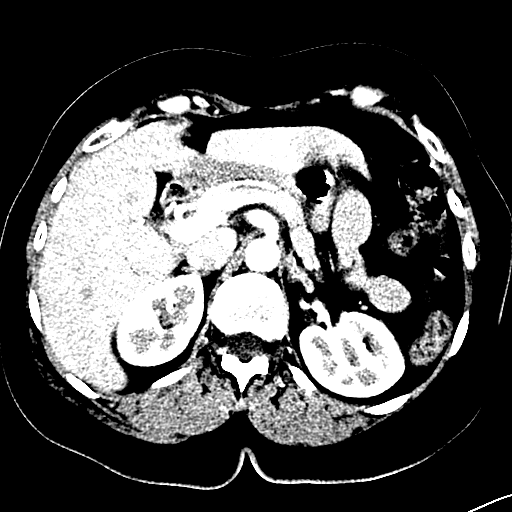
[im 7/67  bone]
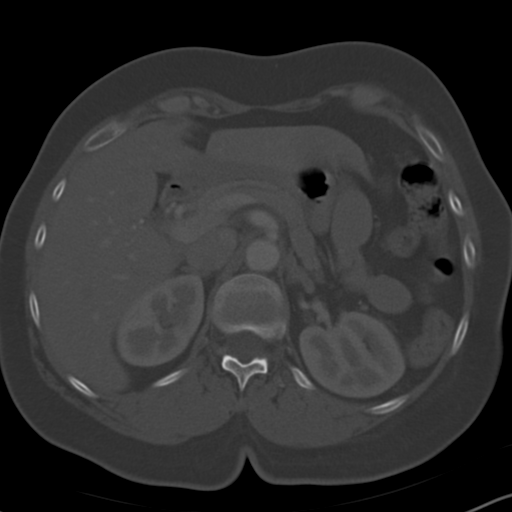
[im 14/67  brain]
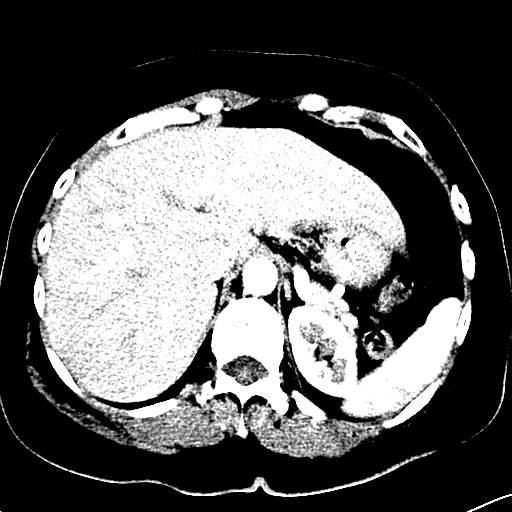
[im 20/67  brain]
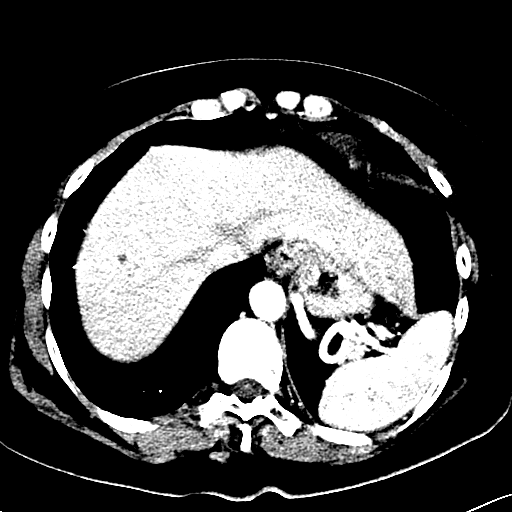
[im 27/67  brain]
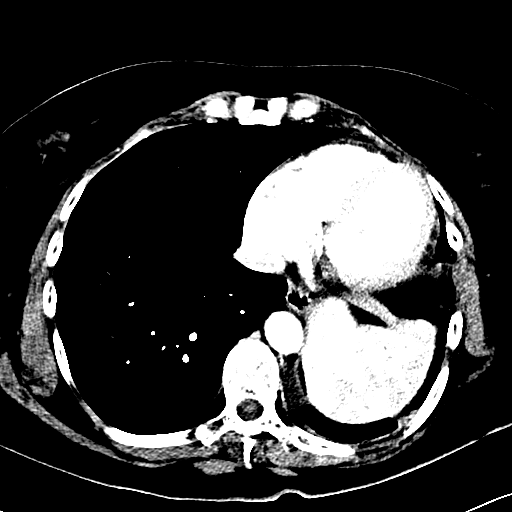
[im 34/67  brain]
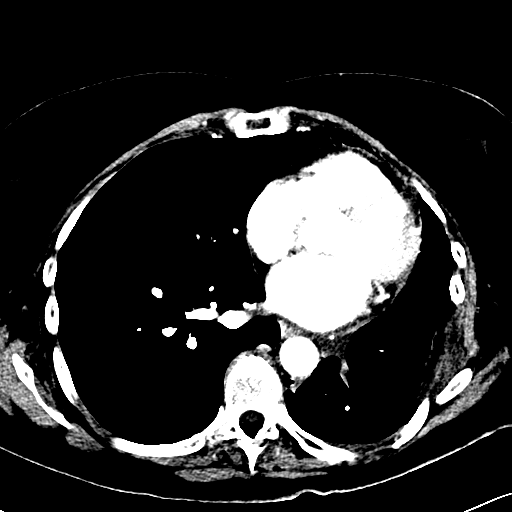
[im 34/67  bone]
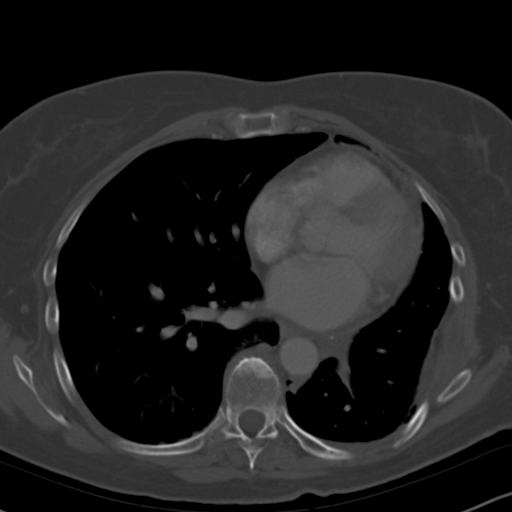
[im 40/67  brain]
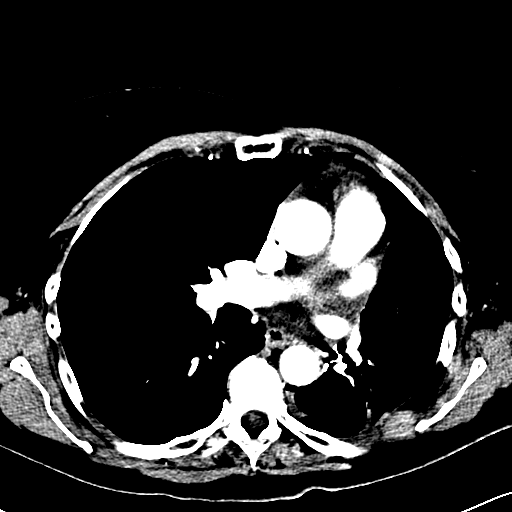
[im 47/67  brain]
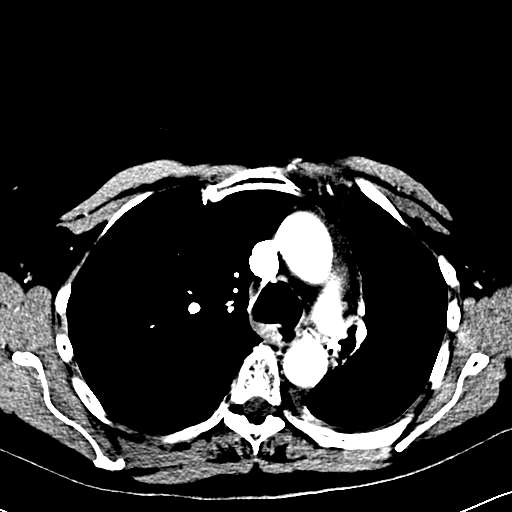
[im 53/67  brain]
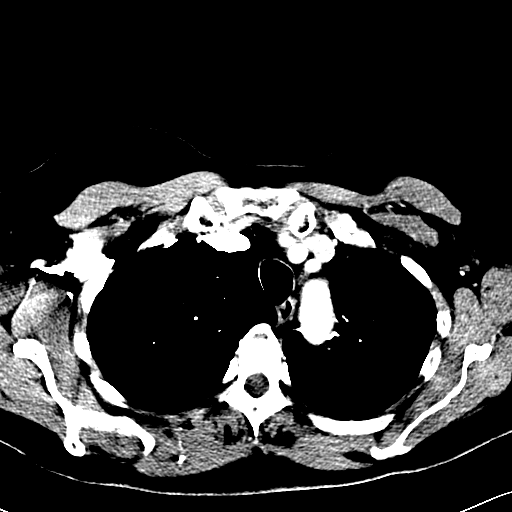
[im 60/67  brain]
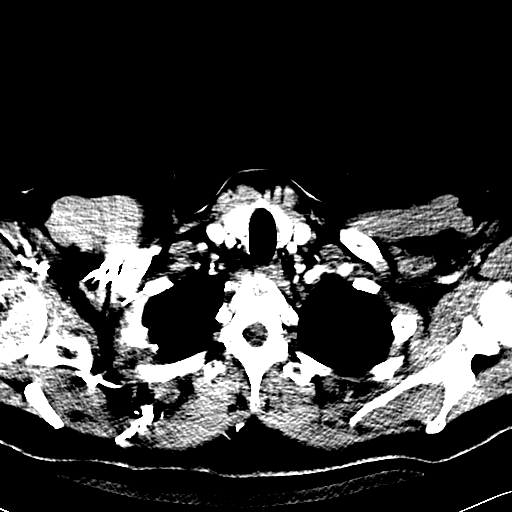
[im 60/67  bone]
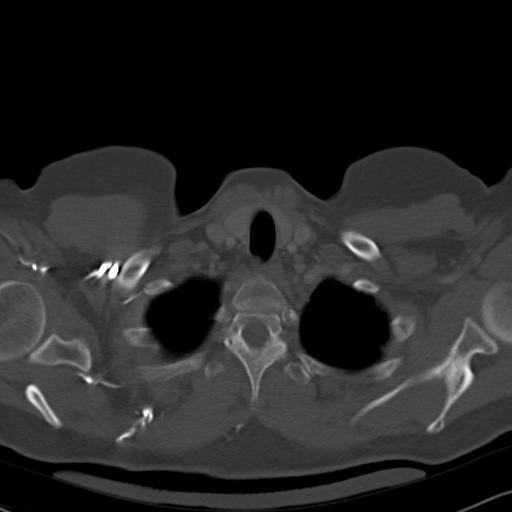

[Series 8: head_seq 5.0 h45s · axial · 0.43mm/px · z∈[+1081,+1151]mm · 3 of 30 slices shown (2 of 2)]
[im 8/30  brain]
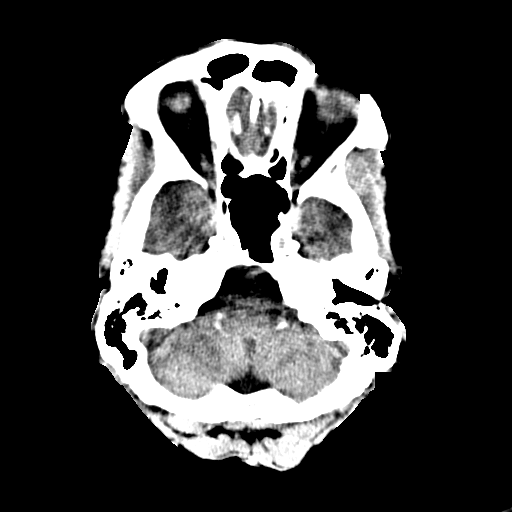
[im 15/30  brain]
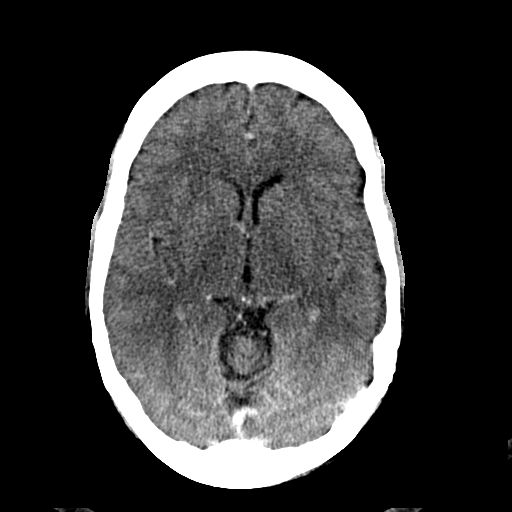
[im 22/30  brain]
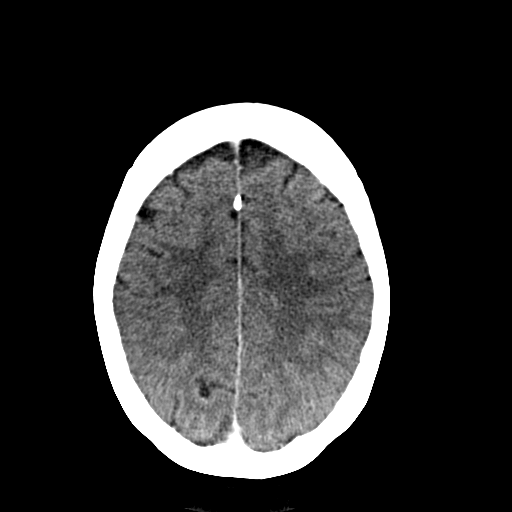

[15 of 30 positions shown; findings below may reference images not displayed]

FINDINGS: In the left parietal lobe, there is a 16 x 14 mm intra-axial hyperdense lesion positioned along the gray-white matter junction.  Postcontrast, there is enhancement.  There is no significant surrounding edema or mass effect.  The lesion is consistent with a metastasis.  No additional lesions are demonstrated.  There is normal vascular enhancement throughout the brain.  No hydrocephalus, midline shift, or extra-axial fluid collection.  The cisterns are patent.
IMPRESSION: Left posterior parietal intraaxial hyperdense and enhancing mass along the gray-white matter junction measuring 16 x 14 mm consistent with an intracranial metastasis given the history of lung carcinoma.  Recommend further evaluation with contrast enhanced MRI.
Findings were called to Dr. Nsc at the [REDACTED] on 09/17/05.
CHEST CT WITH CONTRAST ? 09/17/05:
FINDINGS: The patient is status post left thoracotomy and partial left lung resection.  There is volume loss in the left chest with shift of the mediastinum and heart to the left.  Postoperative clips are present in the left hilum.  Normal heart size.  No effusion.  The left hemidiaphragm remains elevated as before.  In the left hilum, there is stable mild soft tissue prominence about the left hilar vasculature and underlying radiation change in the left hilar lung parenchyma with pleural thickening.  
  The lung windows redemonstrate the postoperative change in the left lung.  There is a stable 6 mm nodule in the left midlung.  Also, there is a stable right upper lobe nodule at the level of the carina.  In the right lower lobe, superior segment, there is an area of focal subpleural nodularity related to scarring also stable.  Dependent bibasilar atelectasis is also evident.  There is also a stable partially calcified low density right thyroid lesion.
Imaging of the upper abdomen demonstrates a stable right hepatic dome low density lesion.
IMPRESSION: 1.  Stable subcentimeter noncalcified nodules in the left midlung and right upper lobe.  Recommend continued CT surveillance to document stability over a two year interval.  
2.  Status post left thoracotomy and left lung partial resection with chronic left hilar radiation fibrosis.  
3.  Stable low density right hepatic dome lesion too small to definitively characterize.  
4.  Stable partially calcified right thyroid lesion.

## 2007-09-16 IMAGING — CT CT HEAD W CONTRAST
1 series · 16 of 30 positions shown, 20 images · IV contrast (100 ML OMNI 300)
Comparison: 09/18/05 MRI and 09/17/05 CT head.

CLINICAL DATA: 59 year-old-female with prior history of lung carcinoma with a left parietooccipital intracranial metastasis.  
HEAD CT WITH CONTRAST:
TECHNIQUE: Contiguous axial images were obtained from the base of the skull through the vertex according to standard protocol following administration of intravenous contrast.
Contrast:  cc Omnipaque 300.

[Series 3: stealth · axial · 0.54mm/px · z∈[+8,+194]mm · 16 of 160 slices shown, 20 images]
[im 6/160  brain]
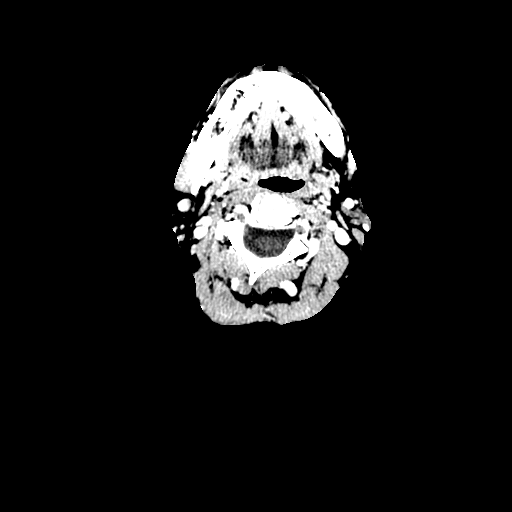
[im 6/160  bone]
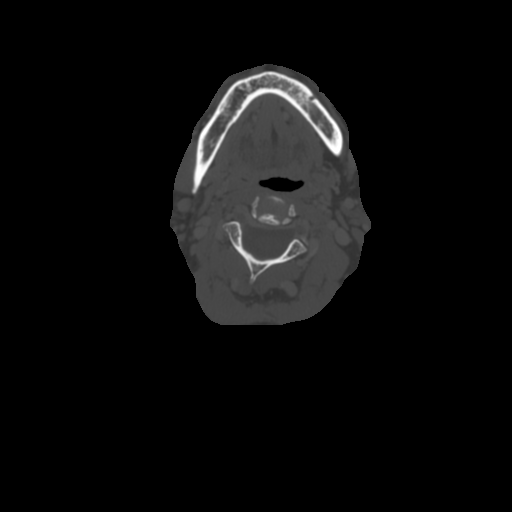
[im 17/160  brain]
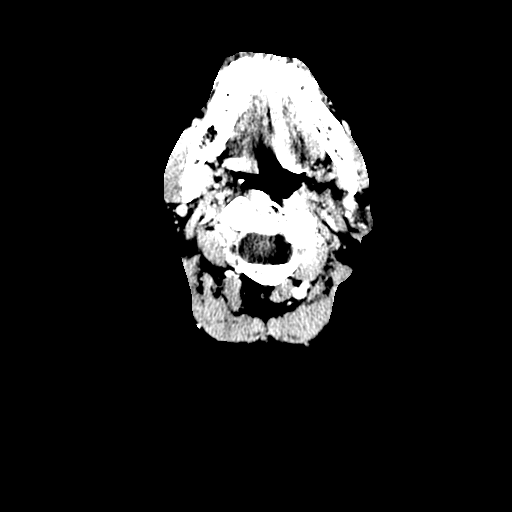
[im 28/160  brain]
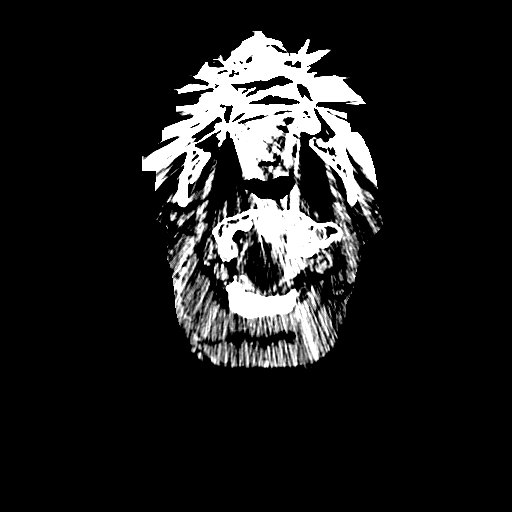
[im 39/160  brain]
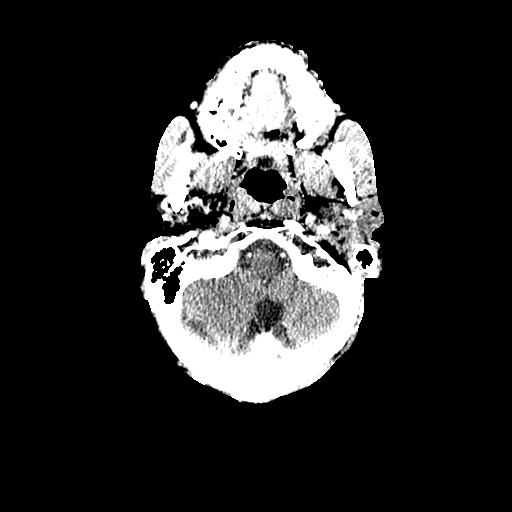
[im 44/160  brain]
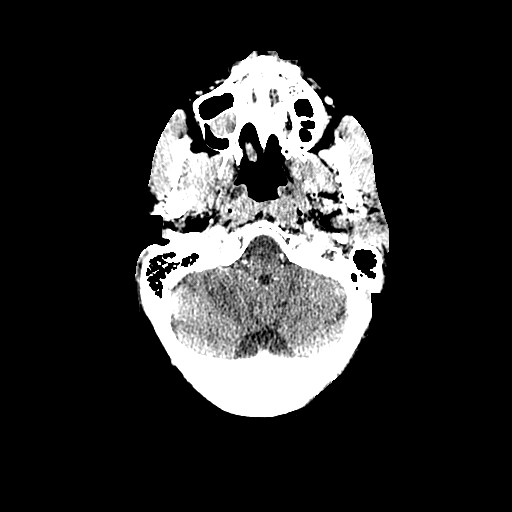
[im 44/160  bone]
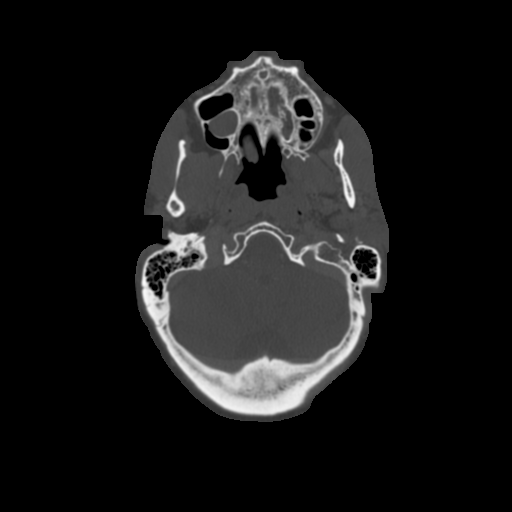
[im 55/160  brain]
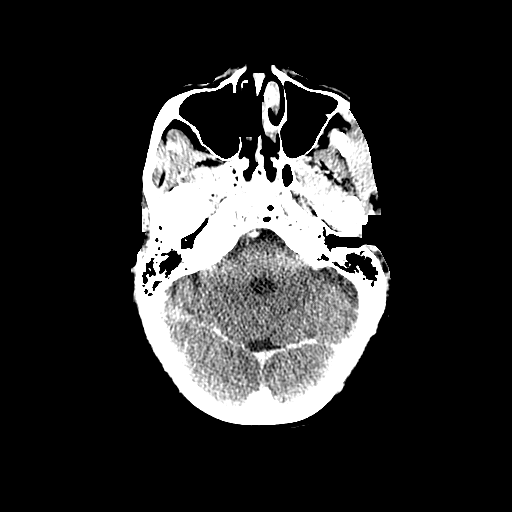
[im 66/160  brain]
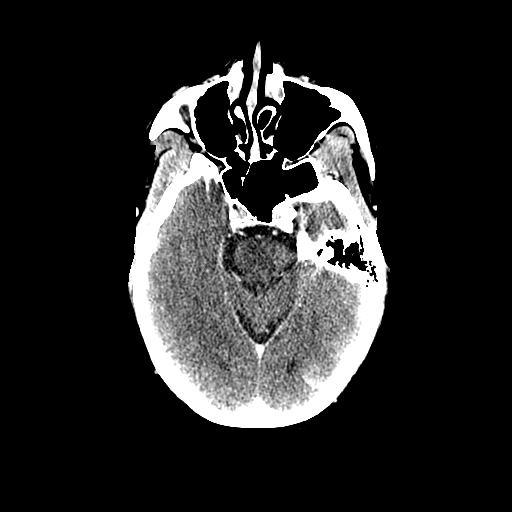
[im 77/160  brain]
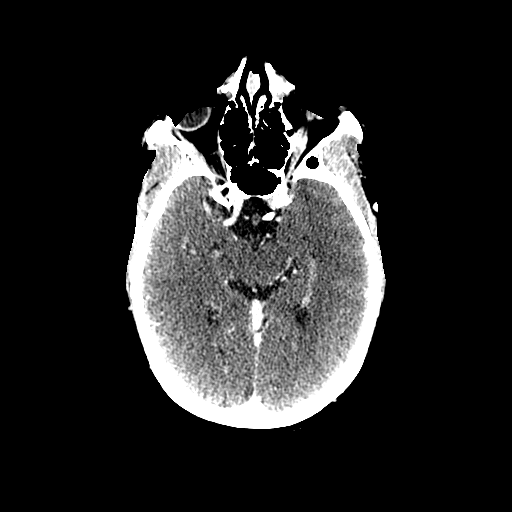
[im 83/160  brain]
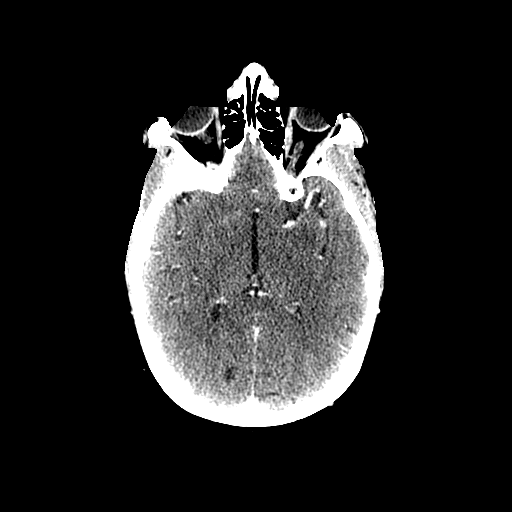
[im 83/160  bone]
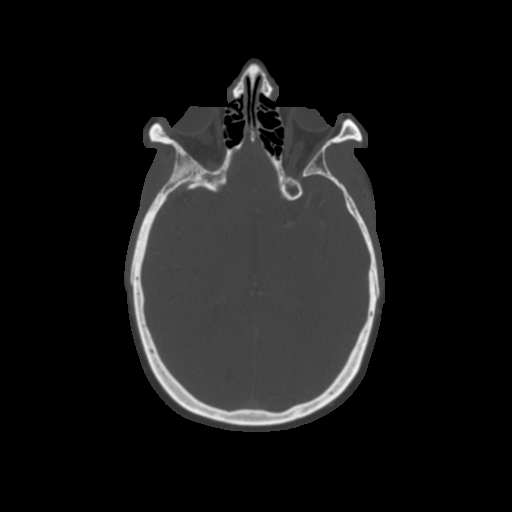
[im 94/160  brain]
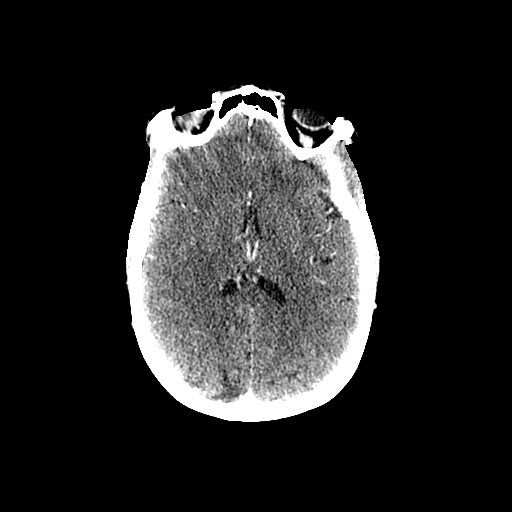
[im 105/160  brain]
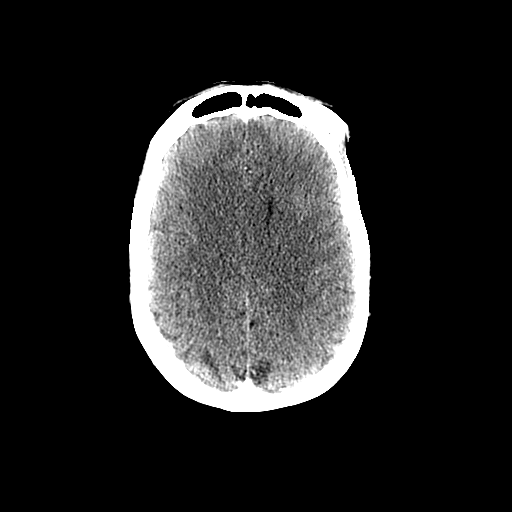
[im 116/160  brain]
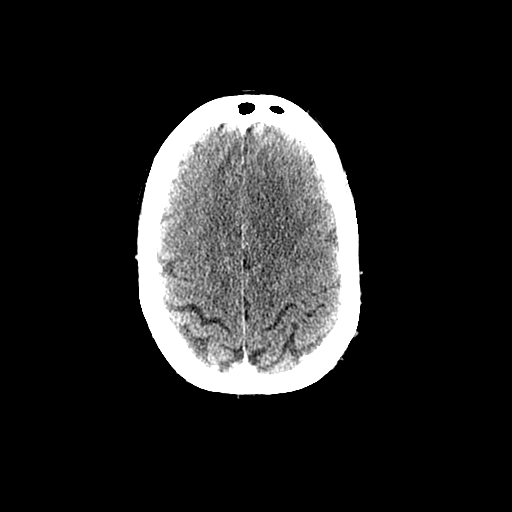
[im 121/160  brain]
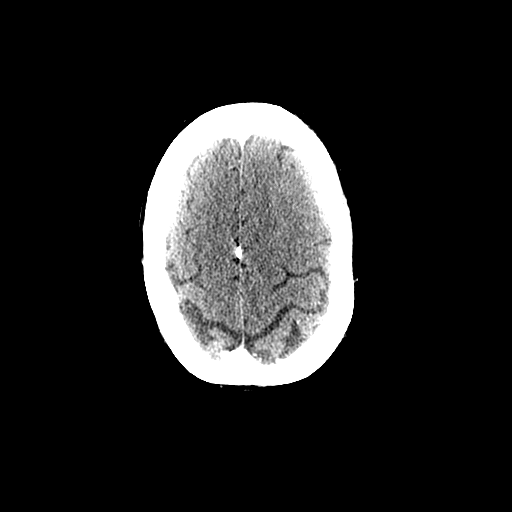
[im 121/160  bone]
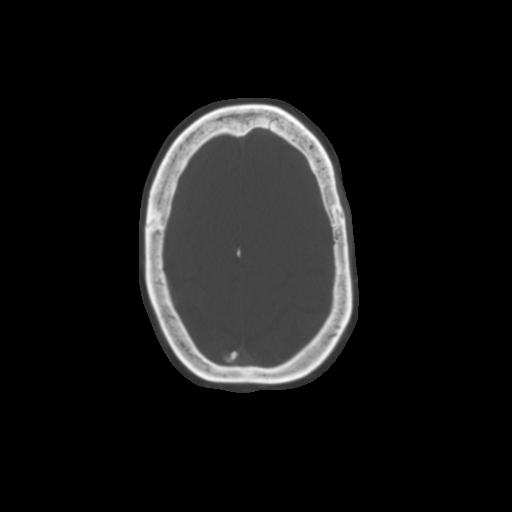
[im 132/160  brain]
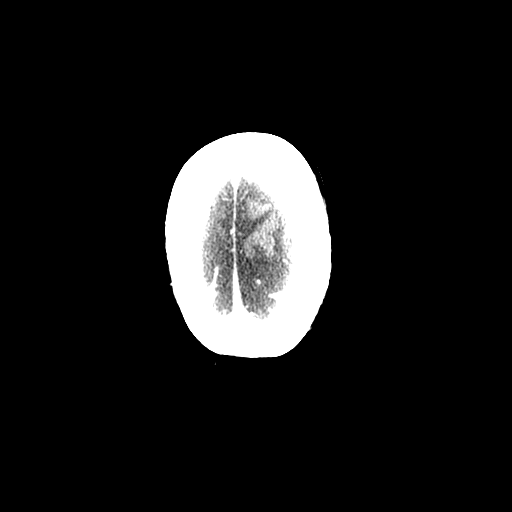
[im 143/160  brain]
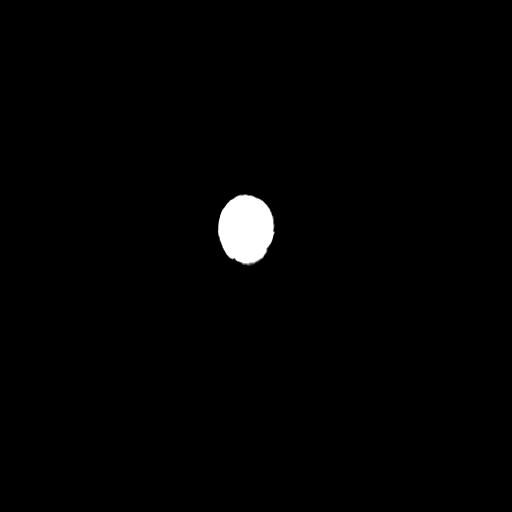
[im 154/160  brain]
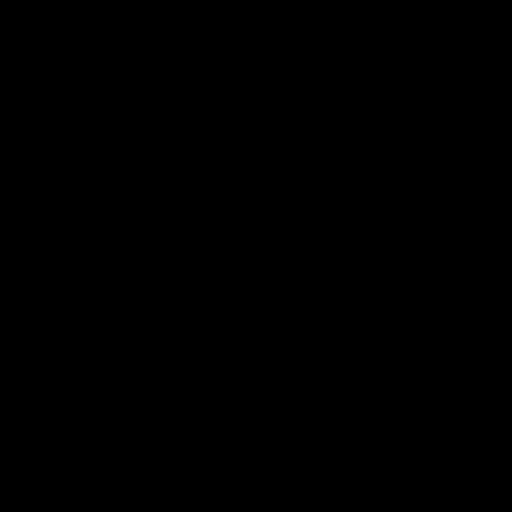

[16 of 30 positions shown; findings below may reference images not displayed]

FINDINGS: There is redemonstration of an intraaxial 17mm enhancing left parietooccipital mass posteriorly consistent with an intracranial metastasis.  No significant surrounding edema or hemorrhage.
IMPRESSION: Enhancing intraaxial left parietooccipital mass consistent with a solitary metastasis roughly measuring 17 mm.

## 2007-09-17 IMAGING — US US INTRAOPERATIVE - NO REPORT
1 series · 6 of 6 positions shown · non-contrast
Comparison: none

CLINICAL DATA: Brain tumor.

INTRAOPERATIVE ULTRASOUND  09/21/2005:
Intraoperative ultrasound was utilized by Dr. Wojtech and Dr. Sheka for the
purposes of brain tumor localization.

[Series 1: unknown · 0.19mm/px · 6 of 6 slices shown]
[im 1/6]
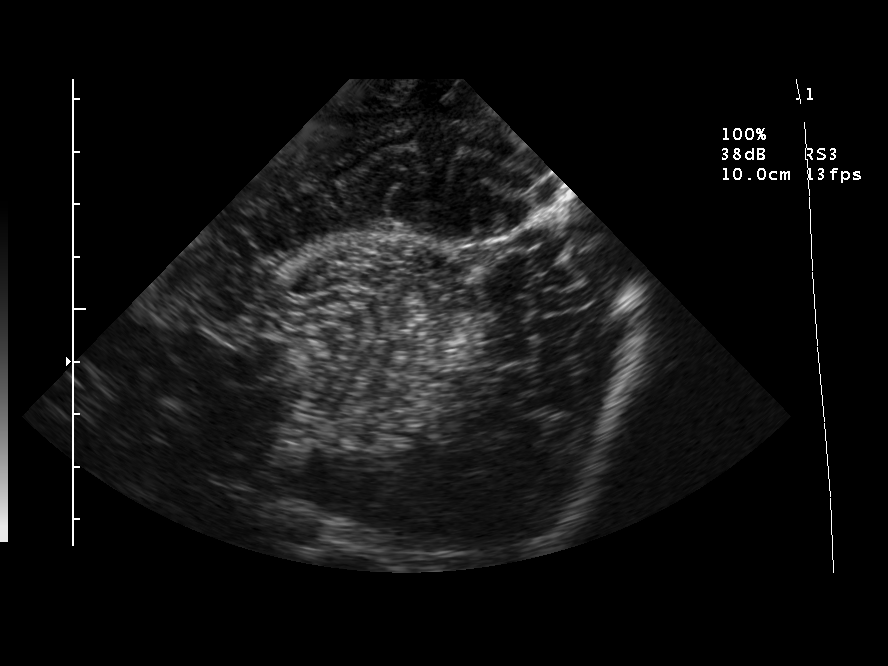
[im 2/6]
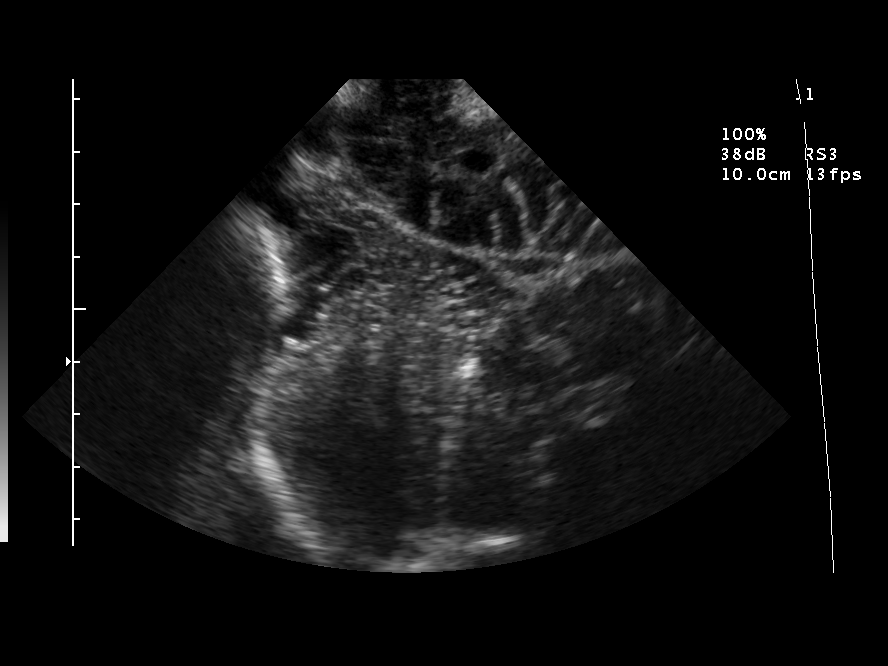
[im 3/6]
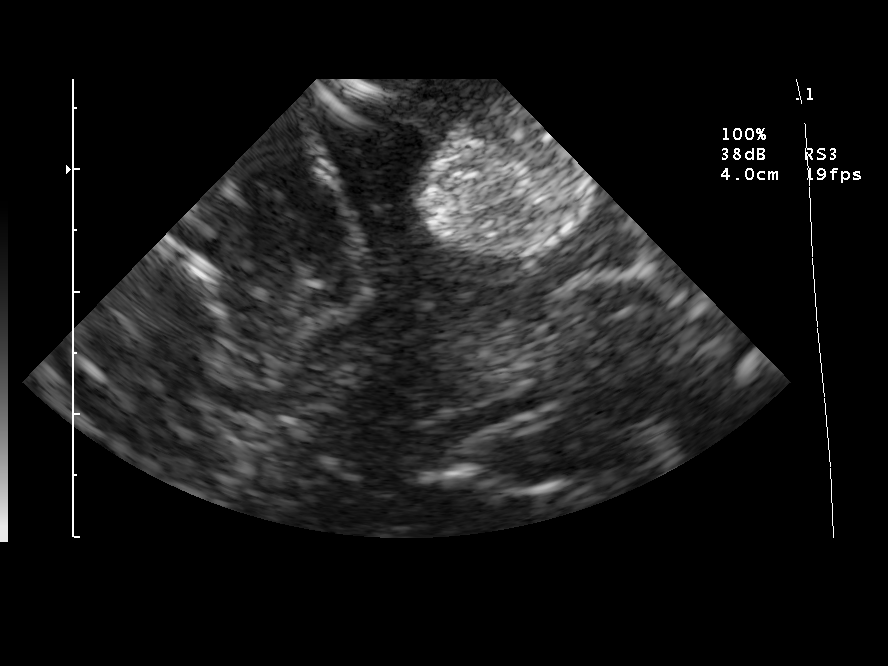
[im 4/6]
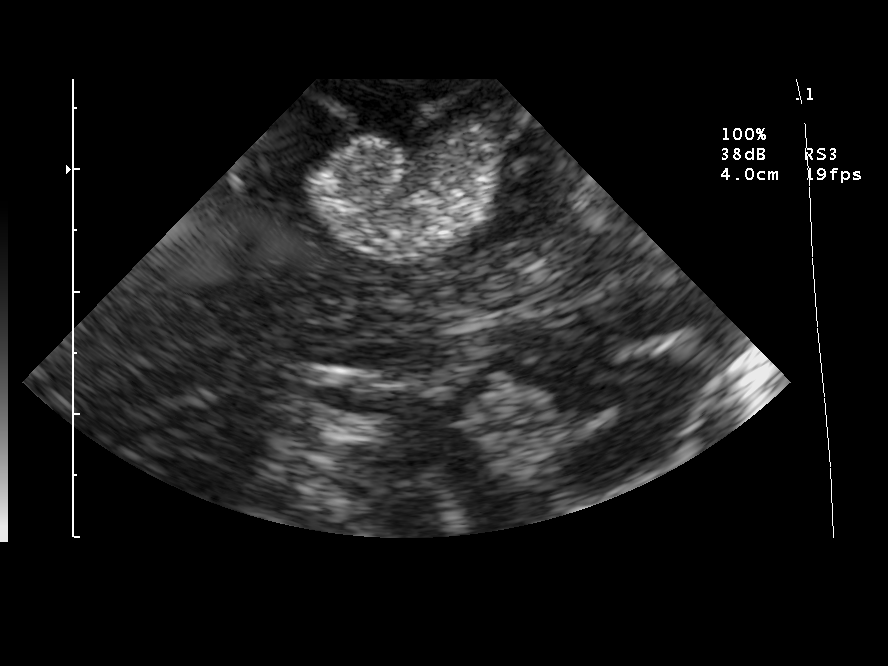
[im 5/6]
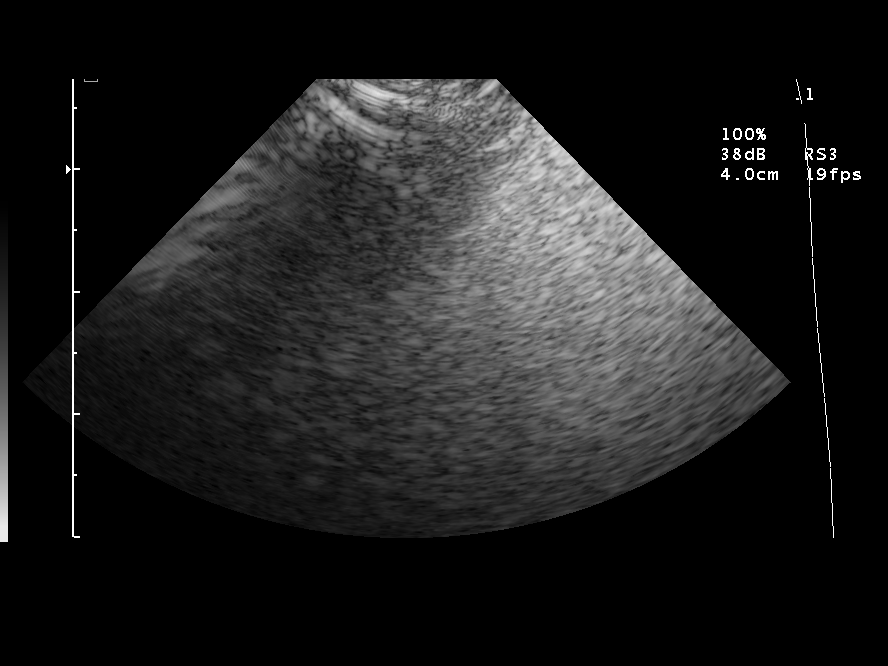
[im 6/6]
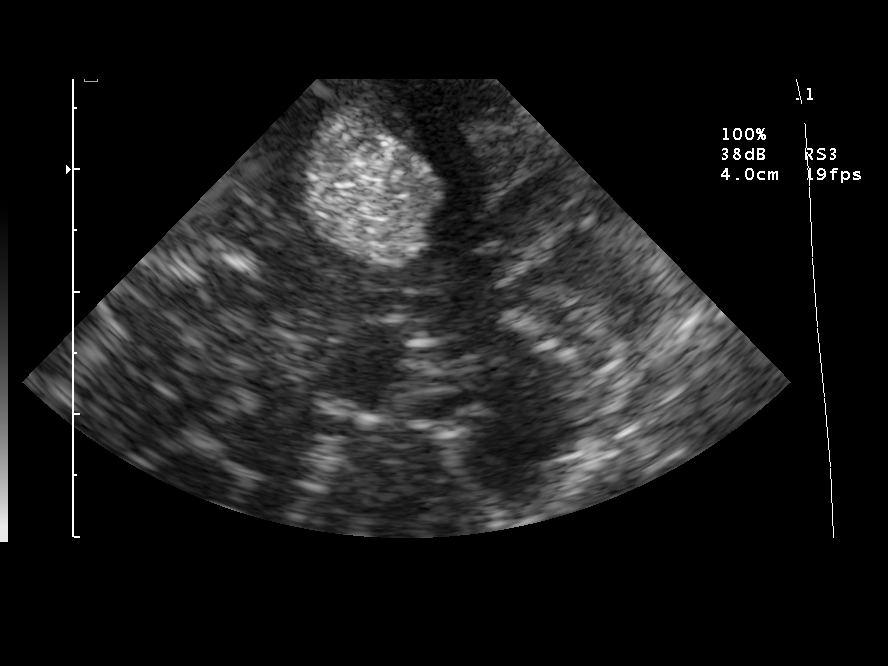

[6 of 6 positions shown; findings below may reference images not displayed]

IMPRESSION: Intraoperative ultrasound utilized by Dr. Wojtech and Dr. Sheka for brain tumor
localization.

## 2007-09-17 IMAGING — CR DG CHEST 2V
2 series · 2 of 2 positions shown · non-contrast
Comparison: Chest CT 09/17/05 and chest radiograph of 01/15/02.

CLINICAL DATA: Brain mass. History of lung cancer. Left lower lobectomy.
 CHEST- 2 VIEW:

[view not recorded (1 of 2)]
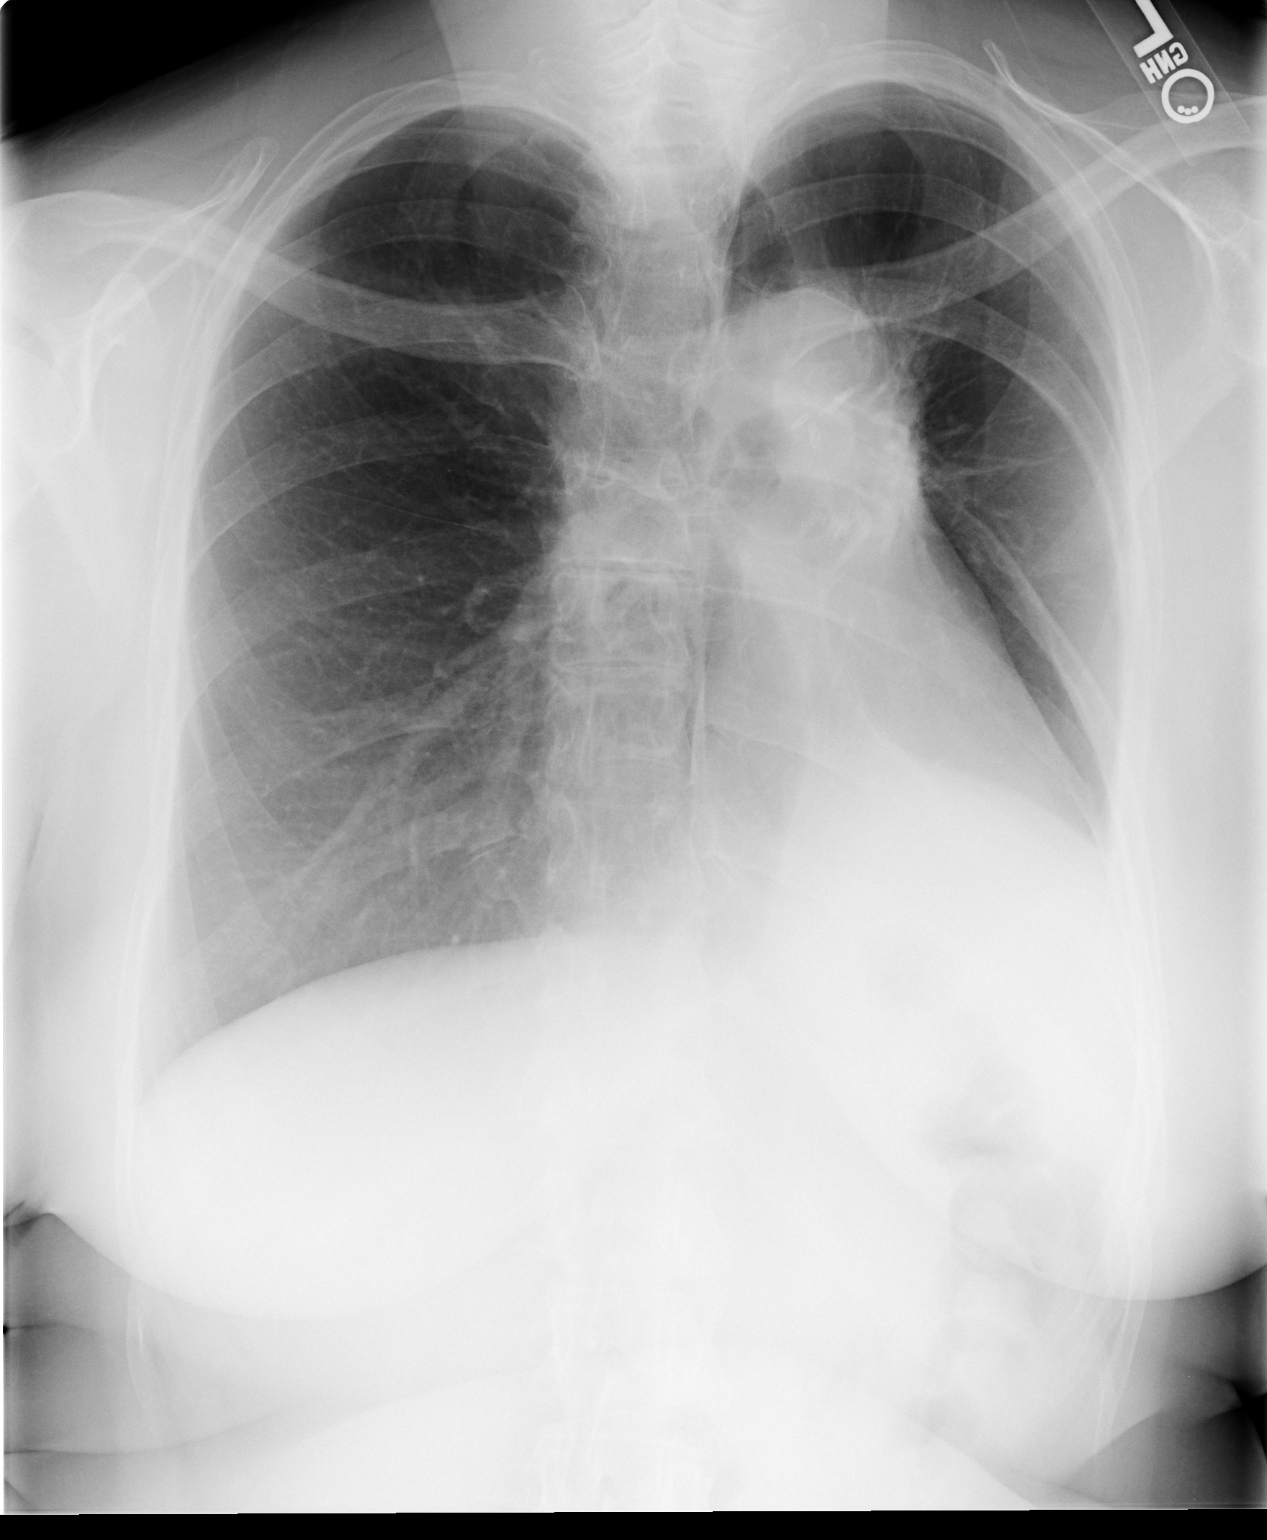

[view not recorded (2 of 2)]
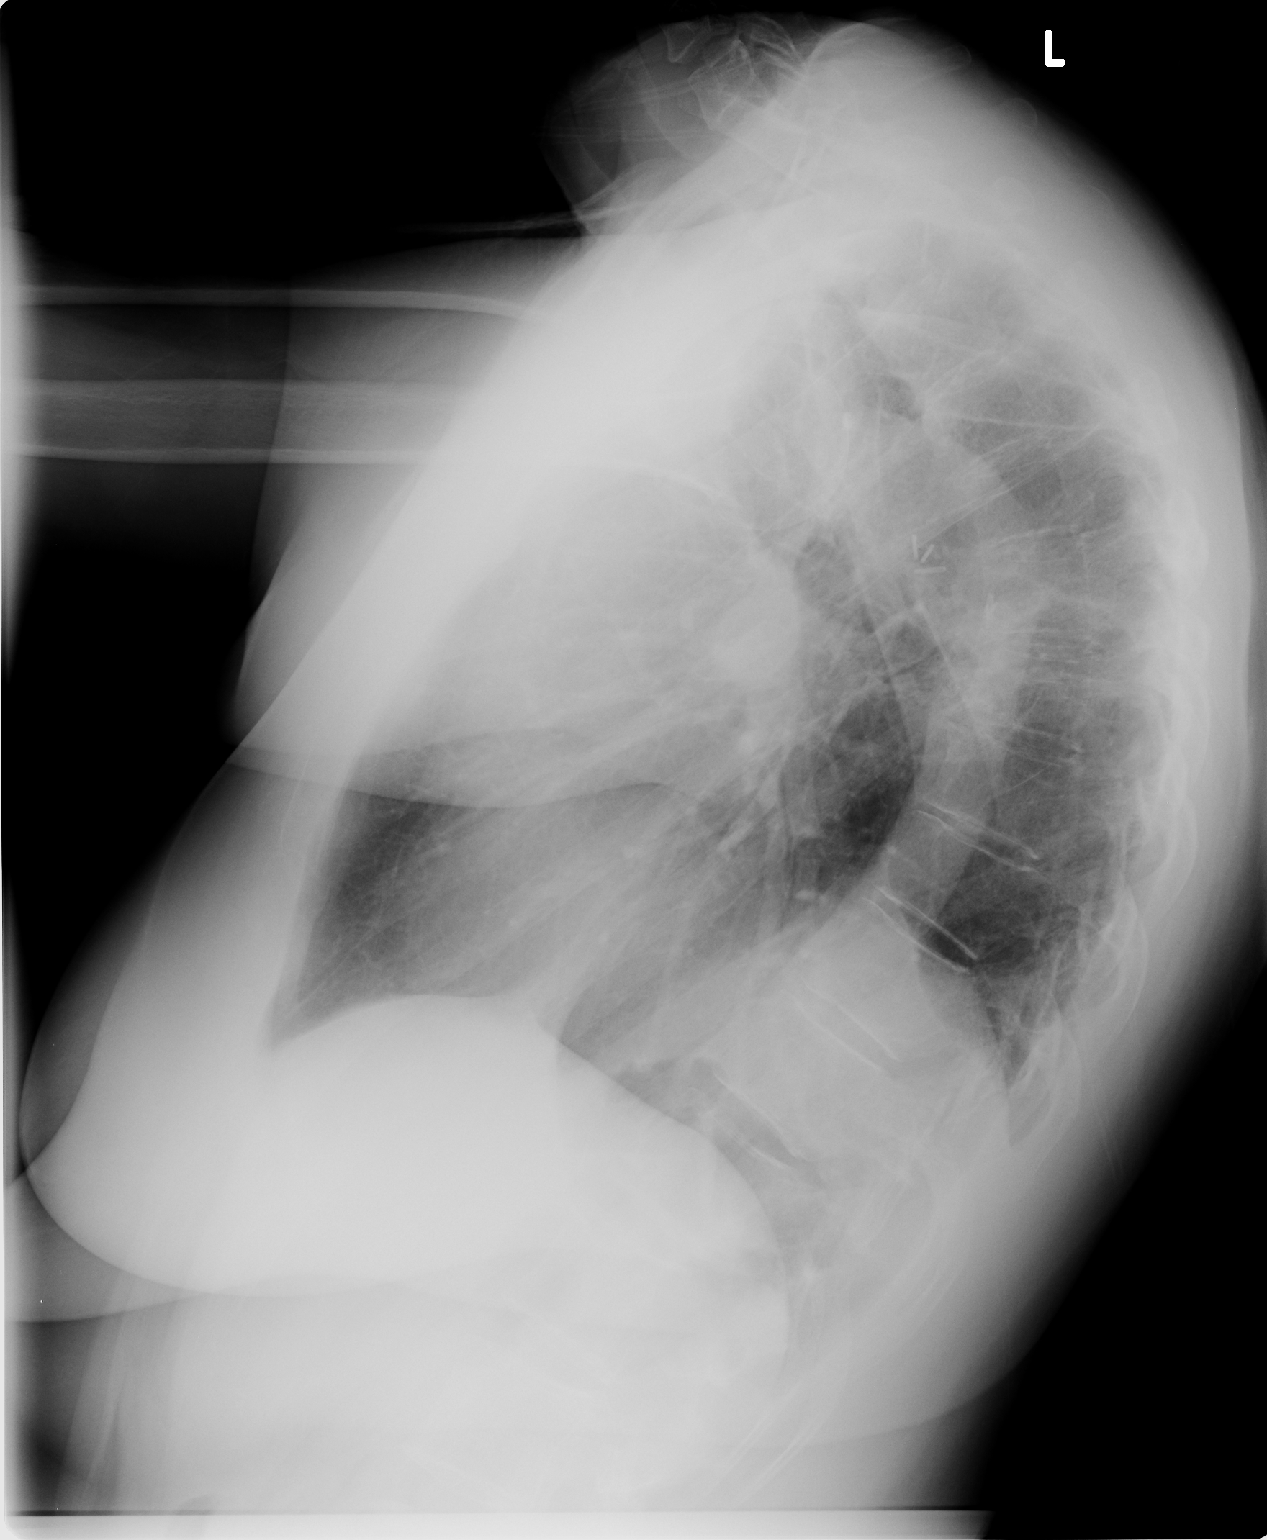

[2 of 2 positions shown; findings below may reference images not displayed]

FINDINGS: Left-sided volume loss with leftward shift of the heart and mediastinal structures noted.  This appears very similar to the prior exam from 1005.  The three tiny nodules shown on recent chest CT are not readily apparent on chest radiography.  There appears to be a band of scarring or atelectasis in the left retrocardiac region.
IMPRESSION: 1.  Left-sided volume loss from prior lobectomy.  No acute radiographic findings.
 2.  No scarring or atelectasis in the left retrocardiac region.

## 2007-09-22 ENCOUNTER — Encounter: Payer: Self-pay | Admitting: Internal Medicine

## 2007-09-29 ENCOUNTER — Ambulatory Visit: Payer: Self-pay | Admitting: Internal Medicine

## 2007-11-18 IMAGING — CT CT CHEST W/ CM
2 of 6 series · 13 of 36 positions shown, 16 images · IV contrast (omnipaque)
Comparison: CT chest 09/13/2005. No prior CT abdomen or pelvis.

CT CHEST

CLINICAL DATA: Lung cancer, metastatic to brain originally diagnosed in 6004 for
which the patient underwent left lower lobectomy, chemotherapy, and radiation
therapy. She currently presents with 20 pound weight loss in the past 3 months,
low-grade fevers, and lower abdominal pain.

CT OF THE CHEST, ABDOMEN, AND PELVIS WITH CONTRAST  11/22/2005:
TECHNIQUE: Multidetector CT imaging of the chest, abdomen, and pelvis was
performed during bolus administration of intravenous contrast. Oral contrast was
given. Delayed imaging through the kidneys and bladder was performed.
Contrast:  125 cc Omnipaque 300

[Series 2: cap 5.0 b40f st · axial · 0.68mm/px · z∈[-592,-32]mm · 10 of 132 slices shown, 13 images]
[im 10/132  mediastinal]
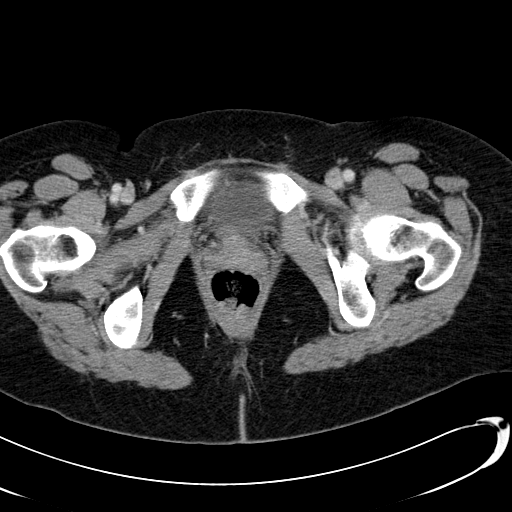
[im 10/132  lung]
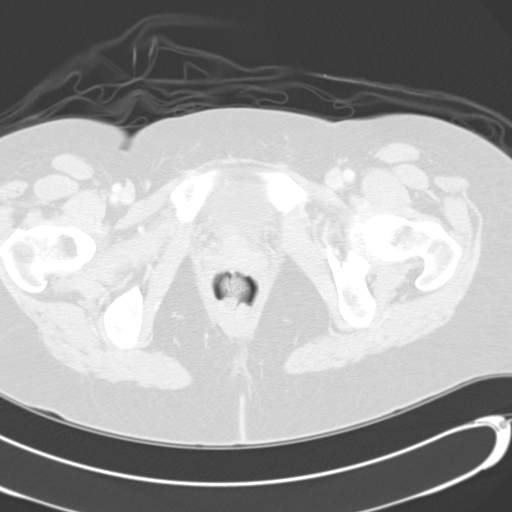
[im 19/132  lung]
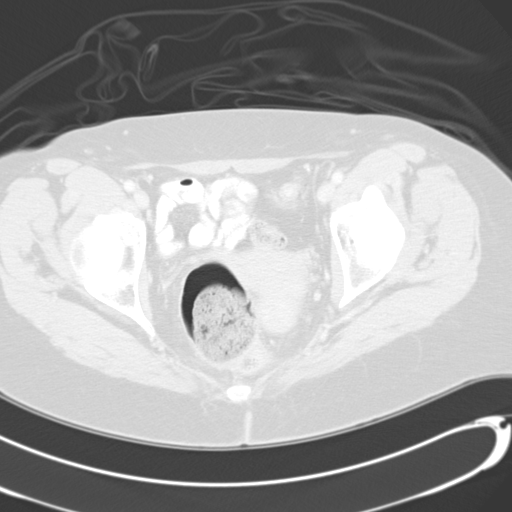
[im 38/132  lung]
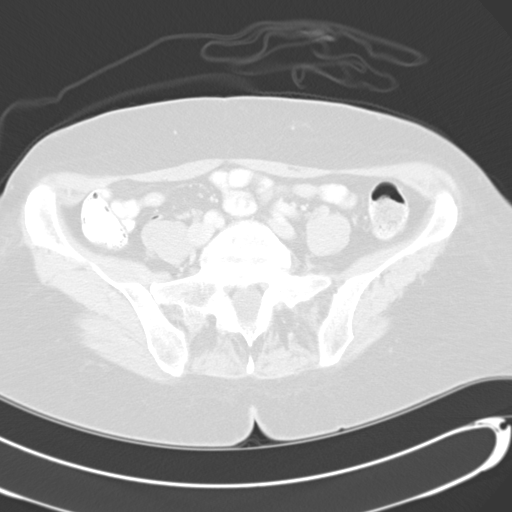
[im 47/132  lung]
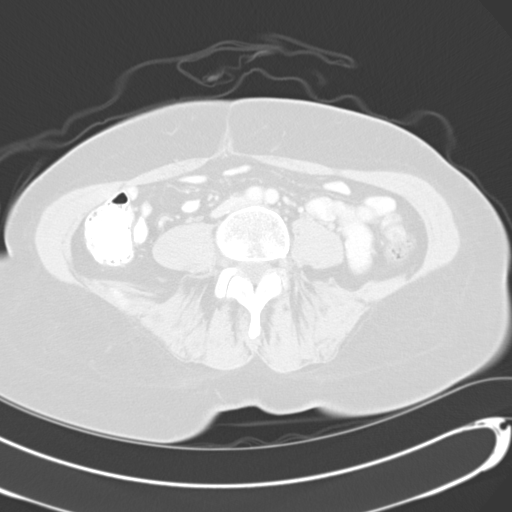
[im 57/132  mediastinal]
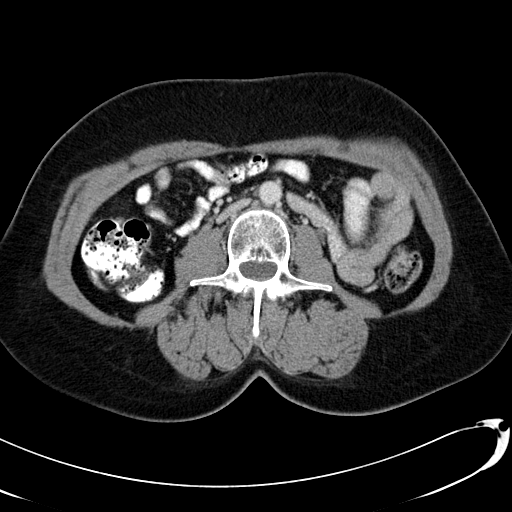
[im 57/132  lung]
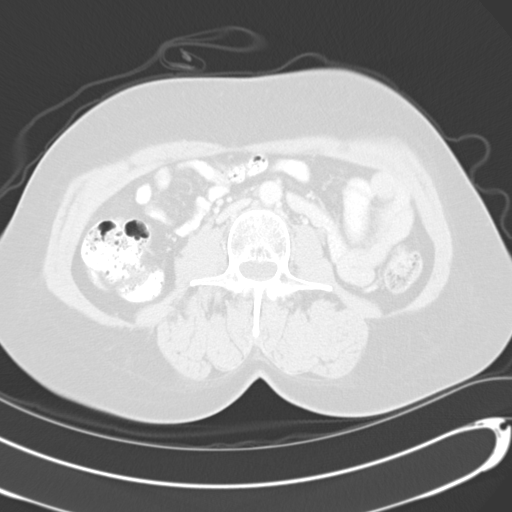
[im 75/132  lung]
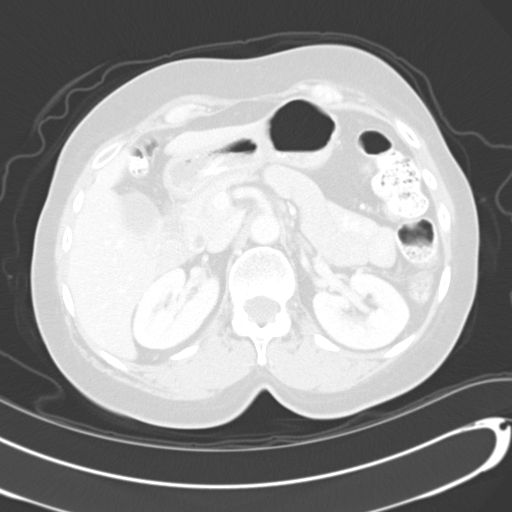
[im 85/132  lung]
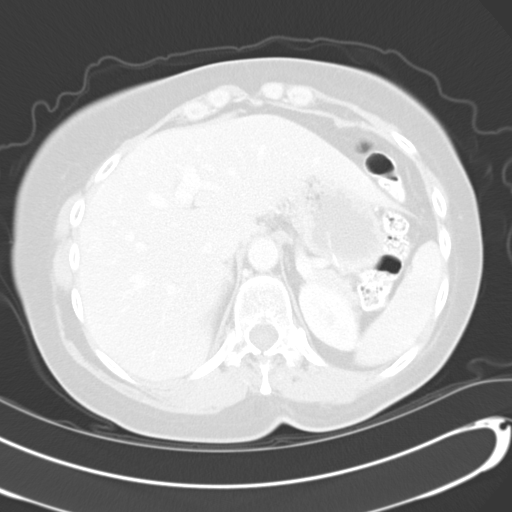
[im 94/132  lung]
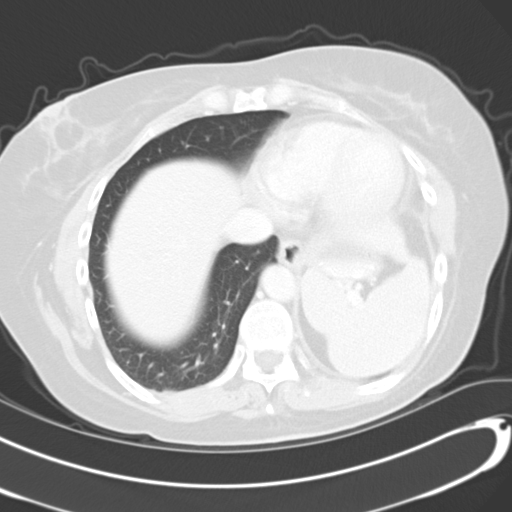
[im 113/132  mediastinal]
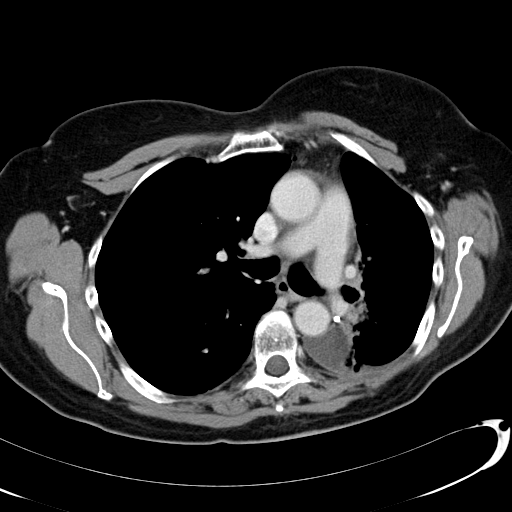
[im 113/132  lung]
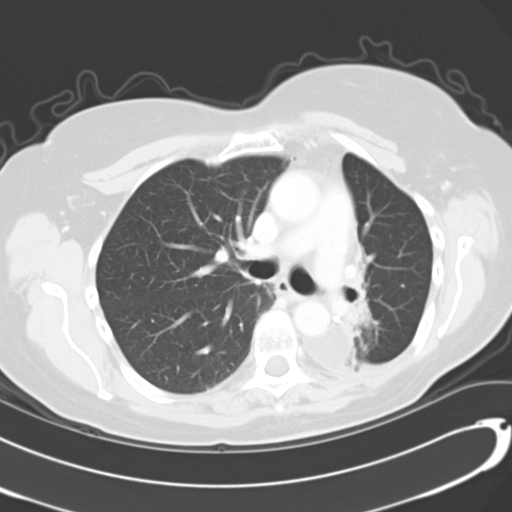
[im 122/132  lung]
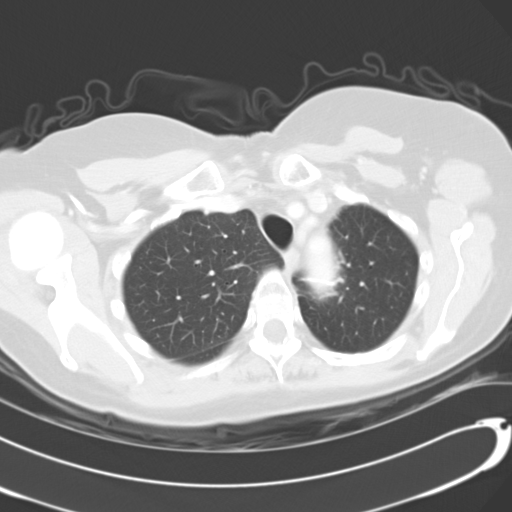

[Series 602: coronal · coronal · 1.32mm/px · 3 of 39 slices shown]
[im 8/39  lung]
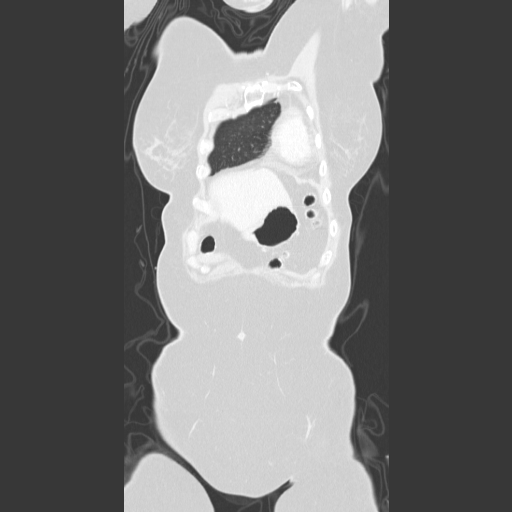
[im 16/39  lung]
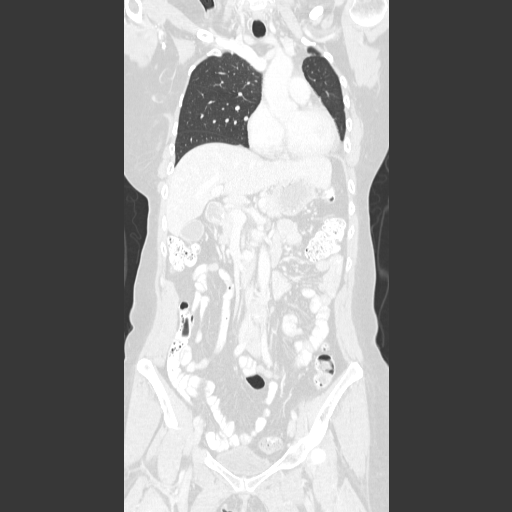
[im 23/39  lung]
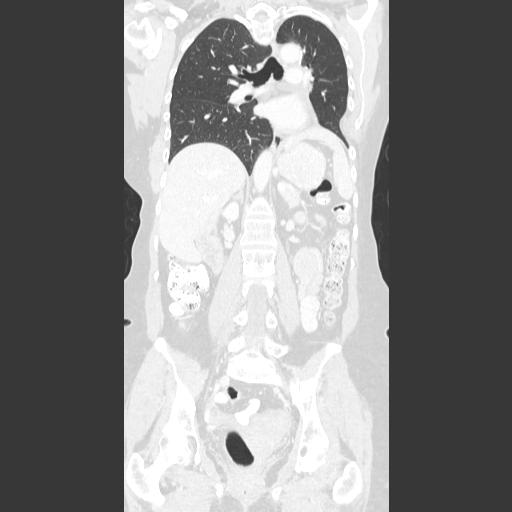

[13 of 36 positions shown; findings below may reference images not displayed]

FINDINGS: Post-surgical changes related to the left lower lobectomy and left
thoracotomy are again noted. There is loculated pleural fluid medially and
posteriorly in the left chest which is unchanged. There has been further
retraction of the post-radiation scarring medially in the remaining left upper
lobe, and there is associated bronchiectasis in this scarring. The nodule
anteriorly in the lingula identified on the previous examination has not
significantly changed, still measuring approximately 7 mm in size. Similarly,
the 4 mm nodule peripherally in the right upper lobe is unchanged. No new or
enlarging pulmonary nodules are identified. There are no focal areas of airspace
consolidation. There is no significant interstitial disease.

There are 2 adjacent low attenuation subcarinal lymph nodes, each on the order
of 1.5 cm, which do not appear significantly changed from the prior study. No
new or enlarging lymphadenopathy is identified in the chest. The heart size
remains normal. There is no pericardial effusion. There is no axillary
lymphadenopathy. Note is again made of the calcified nodule in the lower pole of
the right lobe of the thyroid gland which is unchanged. Review of the bone
window images demonstrate no definite osseous metastatic disease.
IMPRESSION: 1. Stable 7 mm lingular nodule and 4 mm right upper lobe nodule. No new or
enlarging pulmonary nodules are identified.
2. Stable 1.5 cm subcarinal lymph nodes. No new or enlarging lymphadenopathy.
3. Stable loculated pleural fluid posteromedially in the left chest.
4. Stable calcified nodule involving the lower pole of the right lobe of the
thyroid gland. Recent studies have shown that incidental thyroid nodules
identified on CT have a greater than 10% chance of being thyroid cancer. Should
further workup be indicated clinically, then ultrasound would be suggested.

CT ABDOMEN
FINDINGS: A small low attenuation focus in the anterior segment of the right
lobe of the liver is unchanged from the prior examination probably represents a
small cyst. A second tiny low attenuation focus in the medial segment left lobe
is also unchanged. No new hepatic lesions are identified to suggest metastasis.
The spleen and pancreas are unremarkable. The gallbladder is unremarkable by CT
and there is no biliary ductal dilation. Both adrenal glands are unremarkable.
Note is made of duplication of the left renal collecting system; no significant
abnormalities are identified involving either kidney. The stomach and the
visualized colon and small bowel are unremarkable in the upper abdomen. There is
no significant lymphadenopathy. There is no free fluid. The abdominal aorta is
normal in appearance. Review of the bone window images demonstrates
multifactorial spinal stenosis at the L3-L4 and L4-L5 levels but no definite
osseous metastatic disease.
IMPRESSION: 1. No evidence of metastatic disease in the abdomen.
2. No acute abnormalities in the abdomen and no significant change when compared
to the images of the upper abdomen from the prior CT chest 09/17/2005.
3. Duplication of the left renal collecting system.
4. Multifactorial spinal stenosis at L3-L4 and L4-L5.

CT PELVIS
FINDINGS: There is no significant lymphadenopathy in the pelvis. There is an
approximate. No adnexal masses or free fluid are identified. The colon and small
bowel are unremarkable. The appendix is identified in the right mid pelvis,
anterior to the right psoas muscle, and is normal. The urinary bladder is
decompressed and is unremarkable. Review of the bone window images demonstrates
no evidence of osseous metastatic disease.
IMPRESSION: 1. No evidence of metastatic disease in the pelvis.
2. Approximately 4 cm posterior fundal uterine fibroid.

## 2007-12-04 ENCOUNTER — Ambulatory Visit: Payer: Self-pay | Admitting: Internal Medicine

## 2007-12-16 LAB — COMPREHENSIVE METABOLIC PANEL
ALT: 15 U/L (ref 0–35)
AST: 13 U/L (ref 0–37)
Albumin: 4.6 g/dL (ref 3.5–5.2)
Alkaline Phosphatase: 87 U/L (ref 39–117)
BUN: 13 mg/dL (ref 6–23)
CO2: 23 mEq/L (ref 19–32)
Calcium: 9.7 mg/dL (ref 8.4–10.5)
Chloride: 106 mEq/L (ref 96–112)
Creatinine, Ser: 0.78 mg/dL (ref 0.40–1.20)
Glucose, Bld: 86 mg/dL (ref 70–99)
Potassium: 3.9 mEq/L (ref 3.5–5.3)
Sodium: 140 mEq/L (ref 135–145)
Total Bilirubin: 1 mg/dL (ref 0.3–1.2)
Total Protein: 6.8 g/dL (ref 6.0–8.3)

## 2007-12-16 LAB — CBC WITH DIFFERENTIAL/PLATELET
BASO%: 0.2 % (ref 0.0–2.0)
Basophils Absolute: 0 10*3/uL (ref 0.0–0.1)
EOS%: 1.9 % (ref 0.0–7.0)
Eosinophils Absolute: 0.1 10*3/uL (ref 0.0–0.5)
HCT: 38.7 % (ref 34.8–46.6)
HGB: 13.4 g/dL (ref 11.6–15.9)
LYMPH%: 26 % (ref 14.0–48.0)
MCH: 30.6 pg (ref 26.0–34.0)
MCHC: 34.6 g/dL (ref 32.0–36.0)
MCV: 88.4 fL (ref 81.0–101.0)
MONO#: 0.4 10*3/uL (ref 0.1–0.9)
MONO%: 9.4 % (ref 0.0–13.0)
NEUT#: 2.6 10*3/uL (ref 1.5–6.5)
NEUT%: 62.5 % (ref 39.6–76.8)
Platelets: 238 10*3/uL (ref 145–400)
RBC: 4.37 10*6/uL (ref 3.70–5.32)
RDW: 13.6 % (ref 11.3–14.5)
WBC: 4.2 10*3/uL (ref 3.9–10.0)
lymph#: 1.1 10*3/uL (ref 0.9–3.3)

## 2007-12-22 ENCOUNTER — Ambulatory Visit (HOSPITAL_COMMUNITY): Admission: RE | Admit: 2007-12-22 | Discharge: 2007-12-22 | Payer: Self-pay | Admitting: Internal Medicine

## 2008-01-26 ENCOUNTER — Encounter: Payer: Self-pay | Admitting: Internal Medicine

## 2008-01-26 ENCOUNTER — Ambulatory Visit: Payer: Self-pay | Admitting: Internal Medicine

## 2008-01-29 LAB — CBC WITH DIFFERENTIAL/PLATELET
BASO%: 0.8 % (ref 0.0–2.0)
Basophils Absolute: 0 10*3/uL (ref 0.0–0.1)
EOS%: 2.2 % (ref 0.0–7.0)
Eosinophils Absolute: 0.1 10*3/uL (ref 0.0–0.5)
HCT: 40.1 % (ref 34.8–46.6)
HGB: 13.9 g/dL (ref 11.6–15.9)
LYMPH%: 22.2 % (ref 14.0–48.0)
MCH: 30.8 pg (ref 26.0–34.0)
MCHC: 34.7 g/dL (ref 32.0–36.0)
MCV: 88.9 fL (ref 81.0–101.0)
MONO#: 0.4 10*3/uL (ref 0.1–0.9)
MONO%: 9.3 % (ref 0.0–13.0)
NEUT#: 2.9 10*3/uL (ref 1.5–6.5)
NEUT%: 65.5 % (ref 39.6–76.8)
Platelets: 216 10*3/uL (ref 145–400)
RBC: 4.51 10*6/uL (ref 3.70–5.32)
RDW: 12.2 % (ref 11.3–14.5)
WBC: 4.4 10*3/uL (ref 3.9–10.0)
lymph#: 1 10*3/uL (ref 0.9–3.3)

## 2008-01-29 LAB — COMPREHENSIVE METABOLIC PANEL
ALT: 15 U/L (ref 0–35)
AST: 13 U/L (ref 0–37)
Albumin: 4.5 g/dL (ref 3.5–5.2)
Alkaline Phosphatase: 84 U/L (ref 39–117)
BUN: 12 mg/dL (ref 6–23)
CO2: 23 mEq/L (ref 19–32)
Calcium: 9.6 mg/dL (ref 8.4–10.5)
Chloride: 107 mEq/L (ref 96–112)
Creatinine, Ser: 0.7 mg/dL (ref 0.40–1.20)
Glucose, Bld: 96 mg/dL (ref 70–99)
Potassium: 3.9 mEq/L (ref 3.5–5.3)
Sodium: 140 mEq/L (ref 135–145)
Total Bilirubin: 1 mg/dL (ref 0.3–1.2)
Total Protein: 6.8 g/dL (ref 6.0–8.3)

## 2008-02-21 IMAGING — CT CT PELVIS W/ CM
1 of 3 series · 13 of 32 positions shown, 18 images · IV contrast (omnipaque)
Comparison: 11/22/2005

CHEST CT WITH CONTRAST

CLINICAL DATA: Metastatic lung cancer
TECHNIQUE: Multidetector CT imaging of the chest, abdomen, and pelvis was
performed following the standard protocol during bolus administration of
intravenous contrast.

Contrast:  125 cc Omnipaque 300

[Series 2: cap 5.0 b30f · axial · 0.70mm/px · z∈[-585,+5]mm · 13 of 134 slices shown, 18 images]
[im 8/134  soft-tissue]
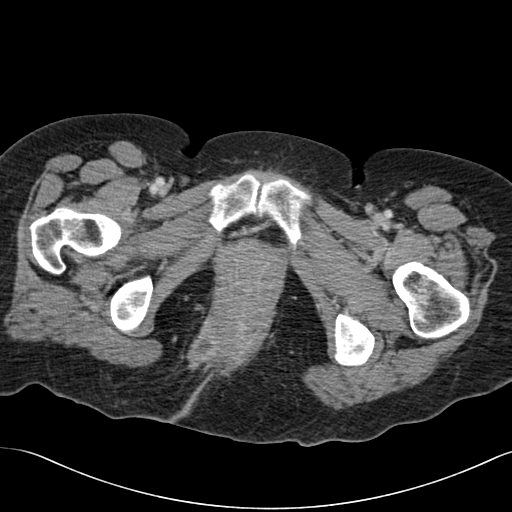
[im 8/134  bone]
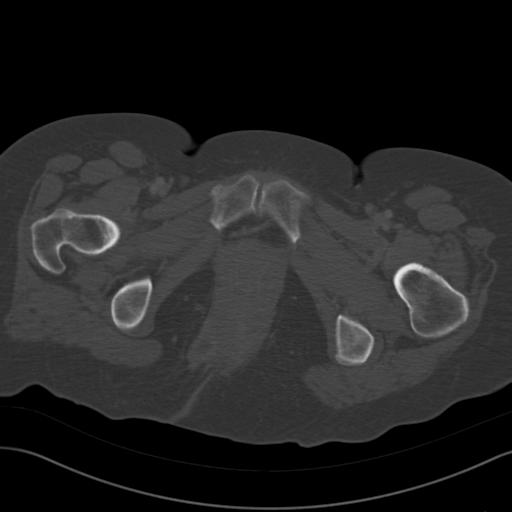
[im 23/134  soft-tissue]
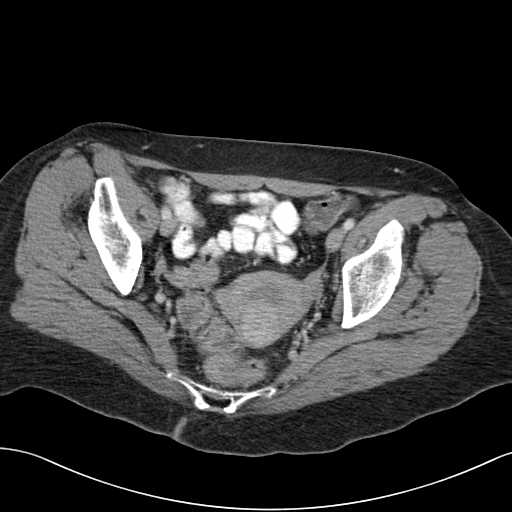
[im 30/134  soft-tissue]
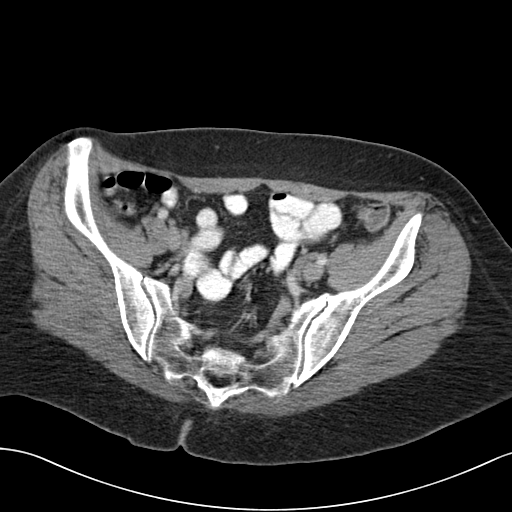
[im 37/134  soft-tissue]
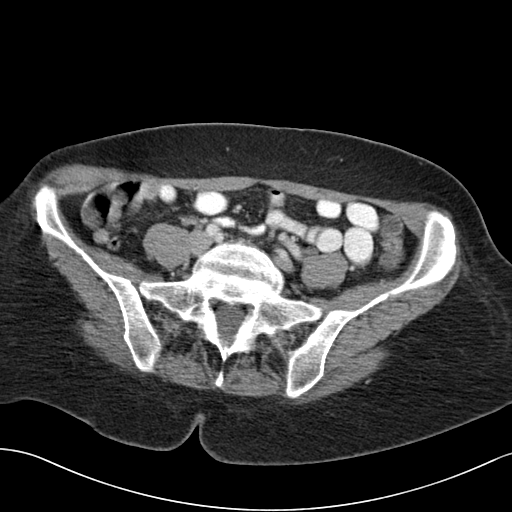
[im 52/134  soft-tissue]
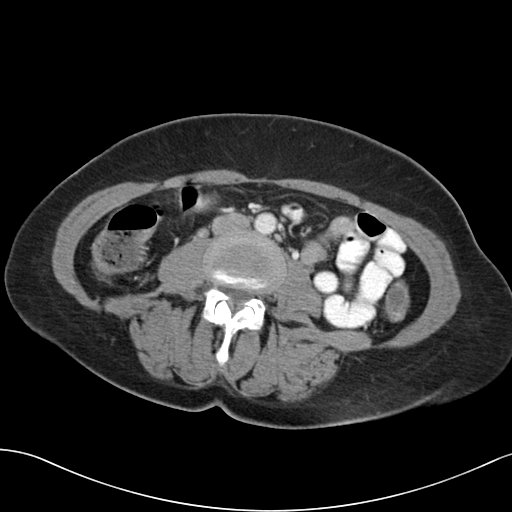
[im 60/134  soft-tissue]
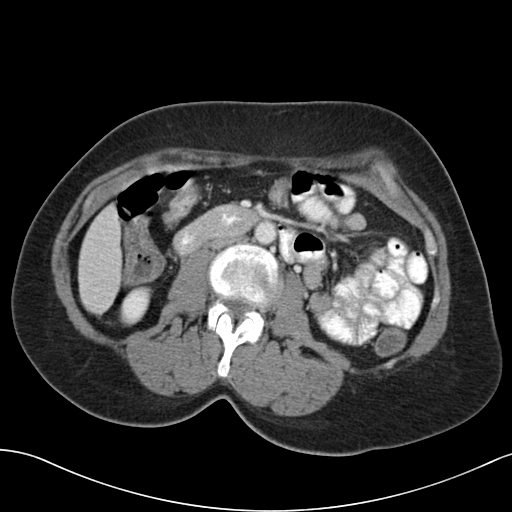
[im 74/134  soft-tissue]
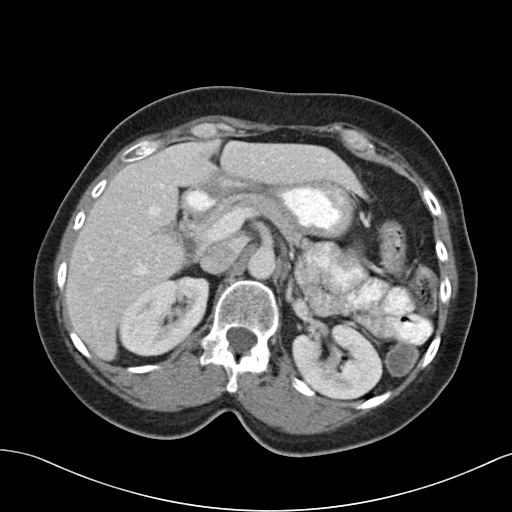
[im 82/134  soft-tissue]
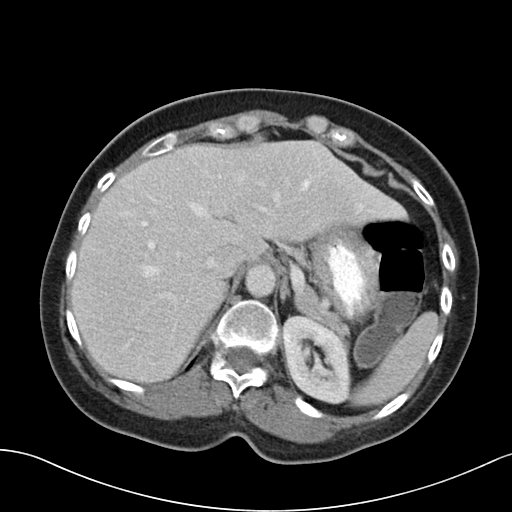
[im 97/134  soft-tissue]
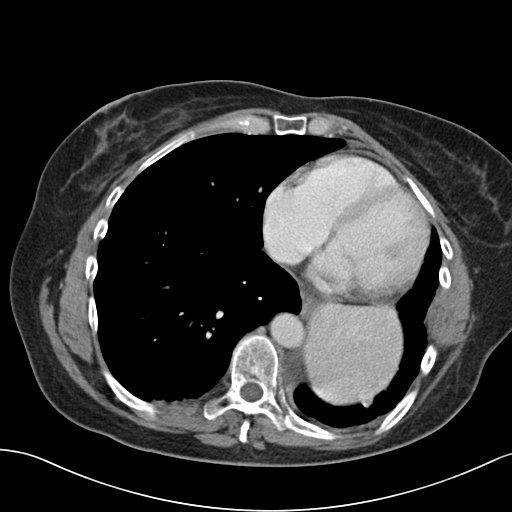
[im 97/134  bone]
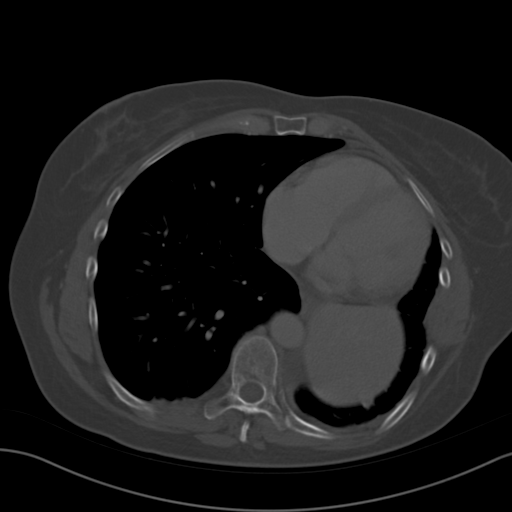
[im 104/134  soft-tissue]
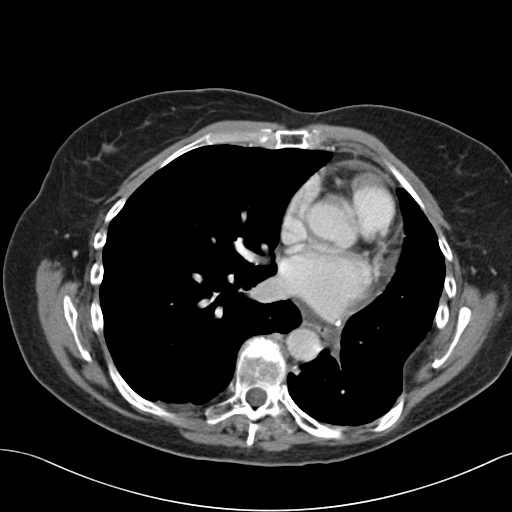
[im 104/134  lung]
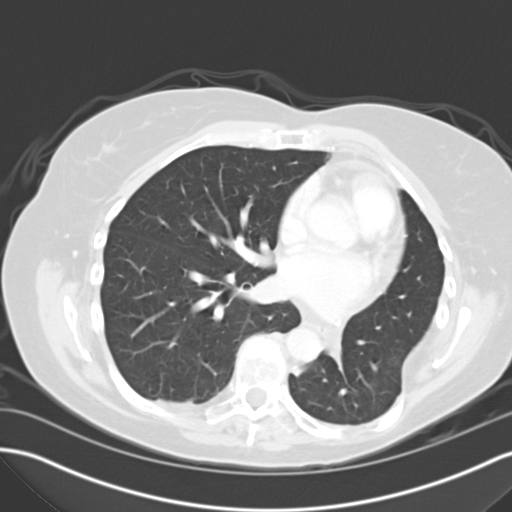
[im 111/134  soft-tissue]
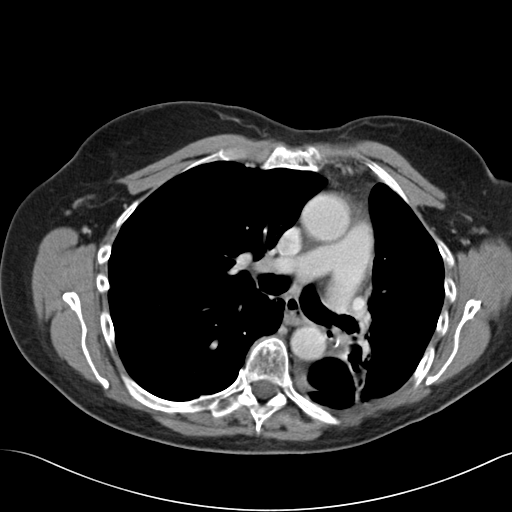
[im 111/134  lung]
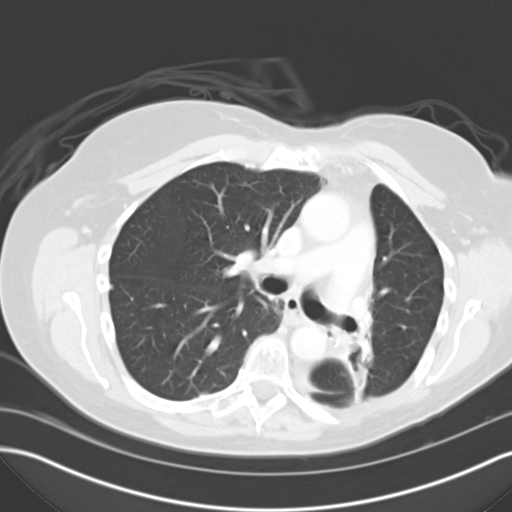
[im 119/134  lung]
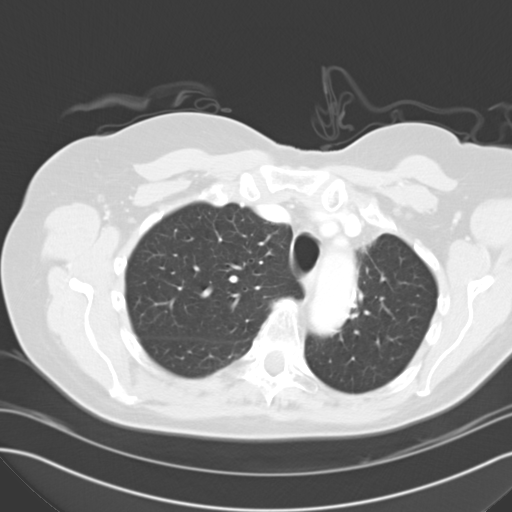
[im 126/134  soft-tissue]
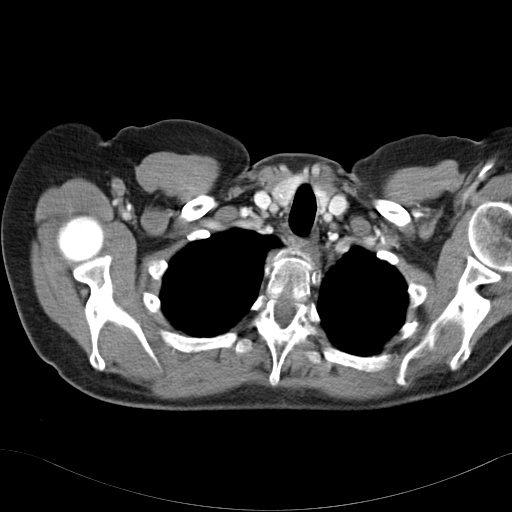
[im 126/134  lung]
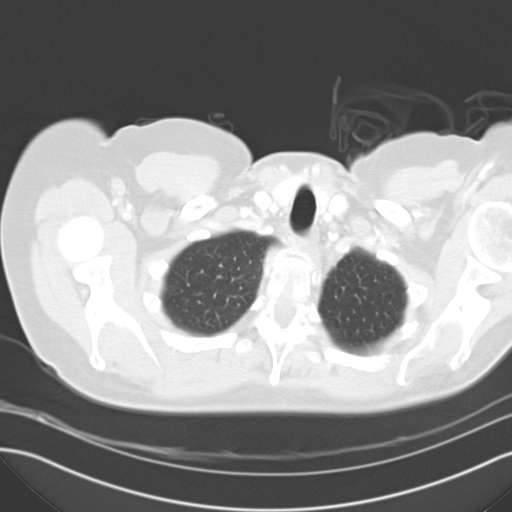

[13 of 32 positions shown; findings below may reference images not displayed]

FINDINGS: Postoperative changes noted within the left lung. There is a stable 7
mm nodule within the lingula of the left lung. Small subpleural nodule noted in
the right lower lobe on image 24, also stable. Nodule in the right upper lobe
also stable, measuring 4 mm on image 20.

Heart and great vessels are of normal size and anatomic configuration. No
mediastinal, hilar, or axillary adenopathy. There is a small loculated pleural
effusion on the left, unchanged. There is a small pericardial effusion, stable
since prior study.

Degenerative changes noted in the thoracic spine. No definite osseous metastatic
disease. Stable calcified nodule noted within the right thyroid lobe.

IMPRESSION

Stable small nodules within the lungs. No new or enlarging nodules. No
adenopathy.

Stable right thyroid nodule.

Postoperative changes on the left with volume loss.

ABDOMEN CT WITH CONTRAST
FINDINGS: Small low density lesion noted in the dome of the liver, less than 1
cm in size, unchanged. Spleen, pancreas, adrenals, kidneys unremarkable. No free
fluid, free air, or adenopathy. Gallbladder and bowel grossly unremarkable.

Review of bone windows shows no definite osseous metastatic disease.

IMPRESSION

No acute findings or evidence of metastatic disease.

PELVIS CT WITH CONTRAST
FINDINGS: Fibroids seen within the uterus. Adnexa unremarkable. Bowel grossly
unremarkable. No free fluid, free air, or adenopathy. No focal bony lesion to
suggest osseous metastatic disease.

Within the right perineum, there is a rim-enhancing soft tissue area with
central low density suggesting fluid. This measures 3.3 x 1.5 cm on image 131.
This was seen on prior study in retrospect and has not changed.

IMPRESSION

Uterine fibroids. 

3.3 cm fluid collection with rim enhancement within the right perineum. This is
unchanged since prior study. Recommend clinical correlation for possible prior
surgery in this area.

No evidence of metastatic disease.

## 2008-02-21 IMAGING — MR MR HEAD WO/W CM
6 of 10 series · 27 of 48 positions shown · IV contrast (magnevist)
Comparison: MRI 09/18/05.

CLINICAL DATA: Lung cancer.  History of craniotomy for resection of tumor. 
 MRI BRAIN WITHOUT AND WITH CONTRAST:
TECHNIQUE: Multiplanar and multiecho pulse sequences of the brain and surrounding structures were obtained according to standard protocol before and after administration of intravenous contrast.
 Contrast:  20 cc Magnevist.

[Series 2: DWI · axial · 5.0mm · 0.94mm/px · z∈[-36,+93]mm · 7 of 52 slices shown (1 of 2)]
[im 1/52]
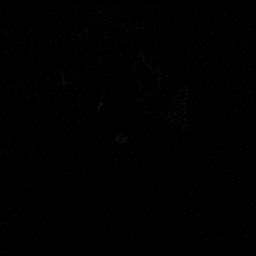
[im 9/52]
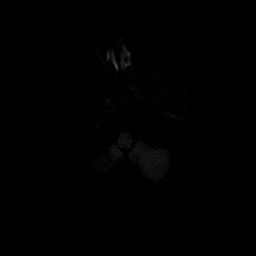
[im 18/52]
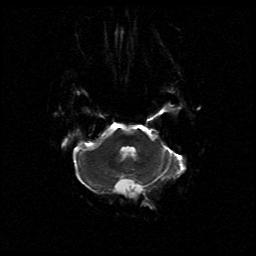
[im 26/52]
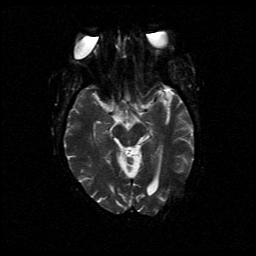
[im 35/52]
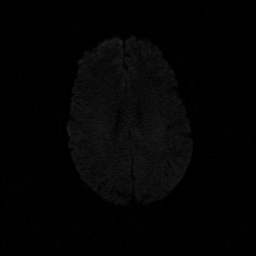
[im 43/52]
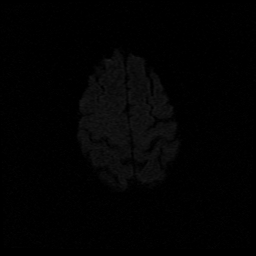
[im 52/52]
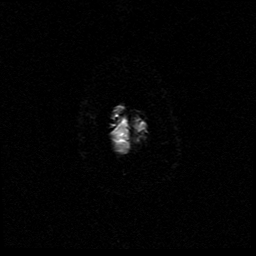

[Series 4: T2 · axial · 5.0mm · 0.47mm/px · z∈[-37,+89]mm · 4 of 24 slices shown (1 of 2)]
[im 1/24]
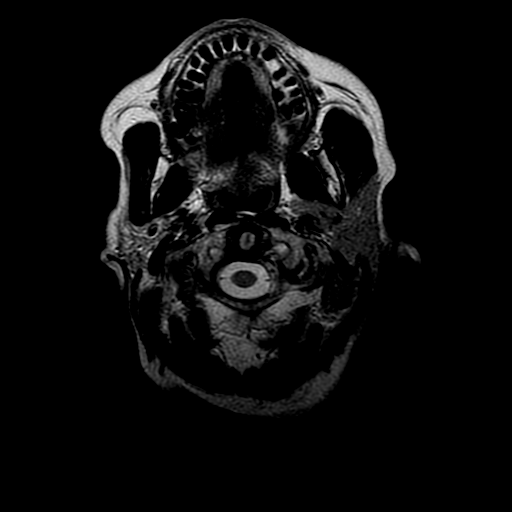
[im 8/24]
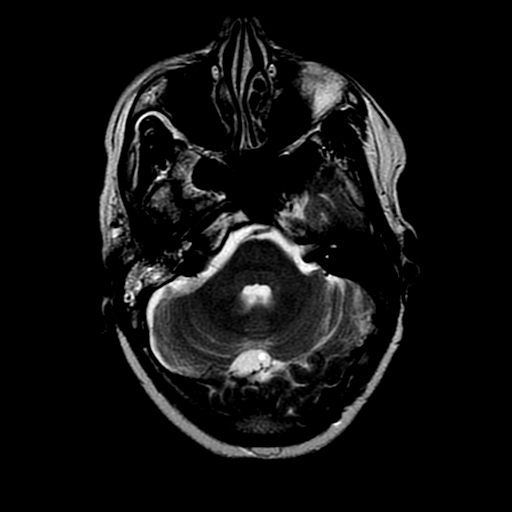
[im 16/24]
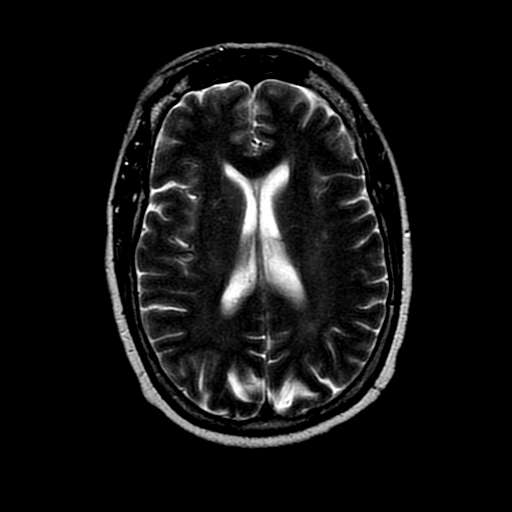
[im 24/24]
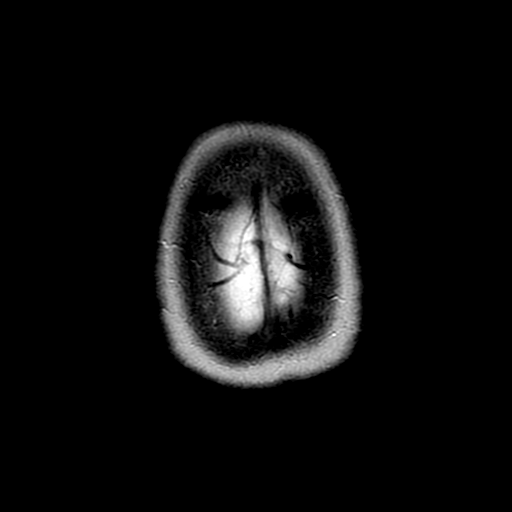

[Series 5: FLAIR · axial · 5.0mm · 0.45mm/px · z∈[-39,+87]mm · 4 of 24 slices shown]
[im 1/24]
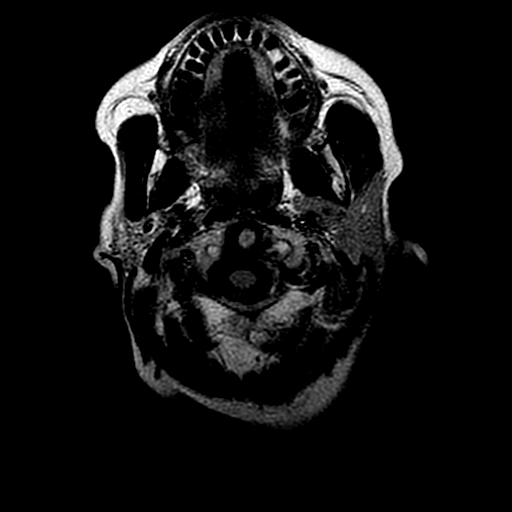
[im 8/24]
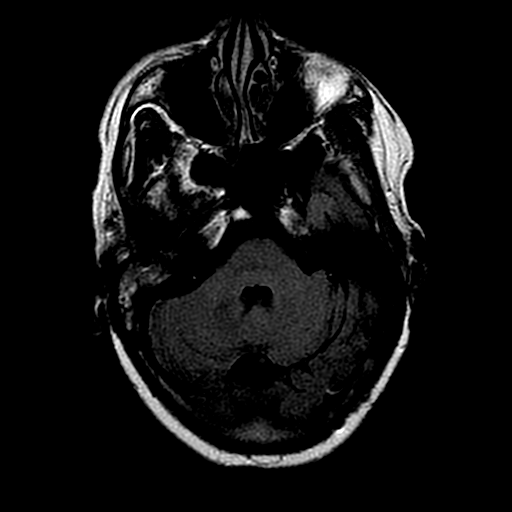
[im 16/24]
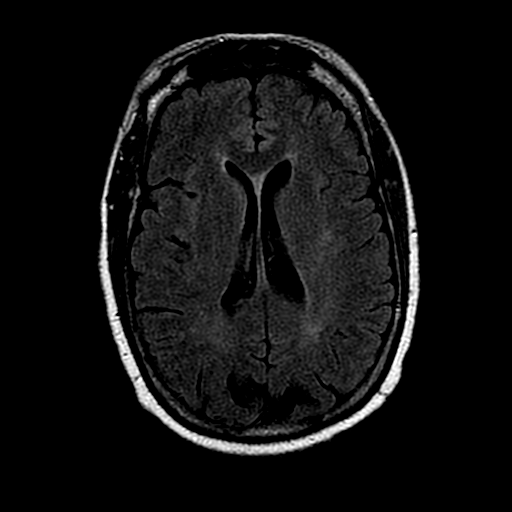
[im 24/24]
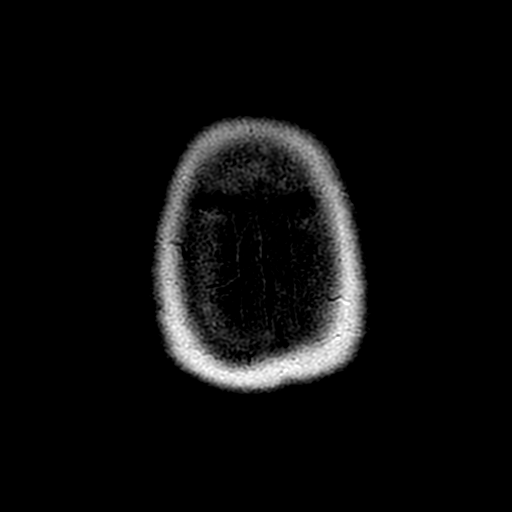

[Series 7: T2 · coronal · 5.0mm · 0.51mm/px · 4 of 24 slices shown (2 of 2)]
[im 1/24]
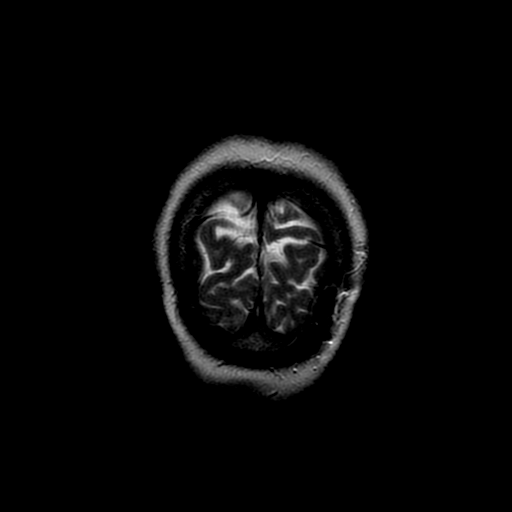
[im 8/24]
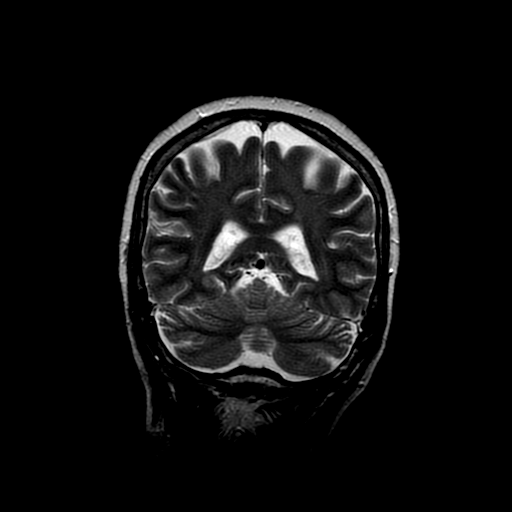
[im 16/24]
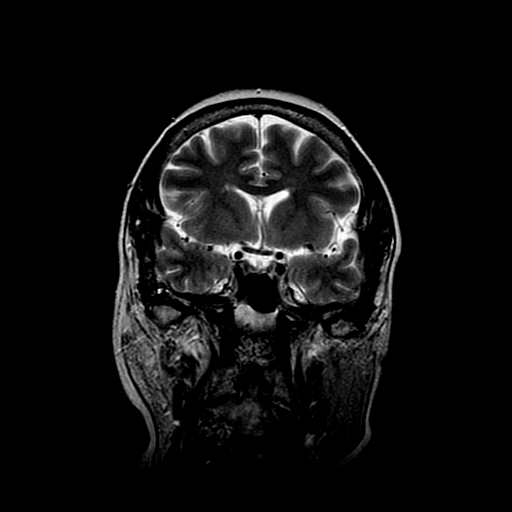
[im 24/24]
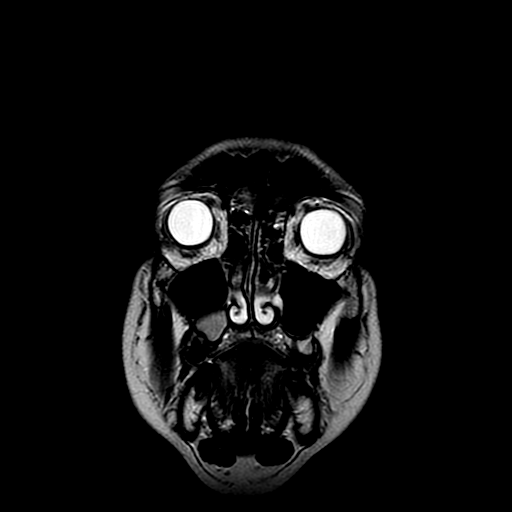

[Series 9: T1 post-contrast · coronal · 5.0mm · 0.43mm/px · 4 of 24 slices shown]
[im 1/24]
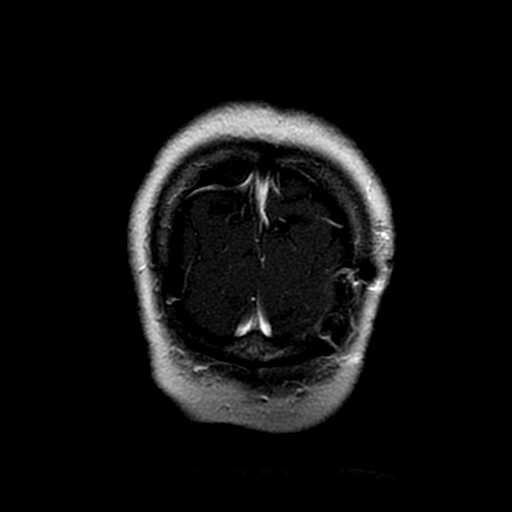
[im 8/24]
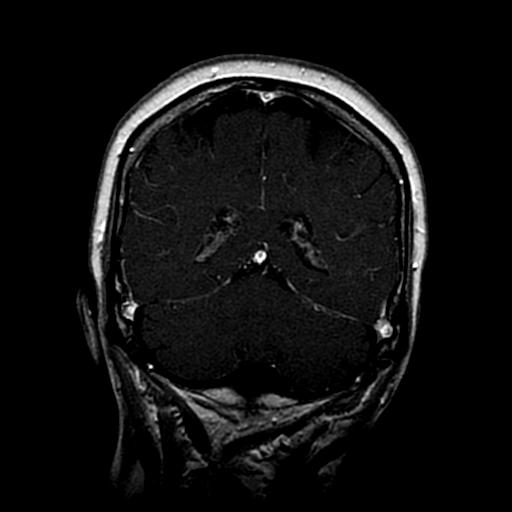
[im 16/24]
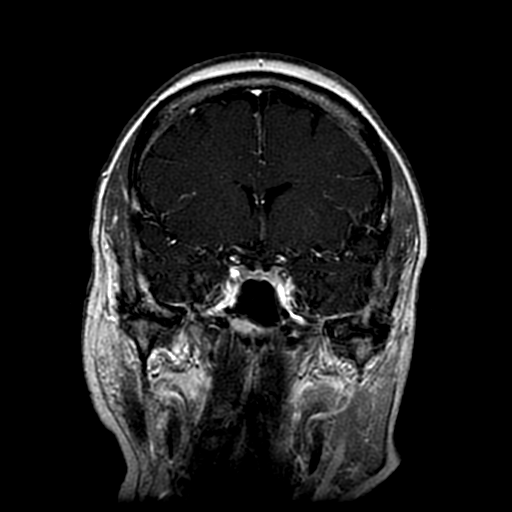
[im 24/24]
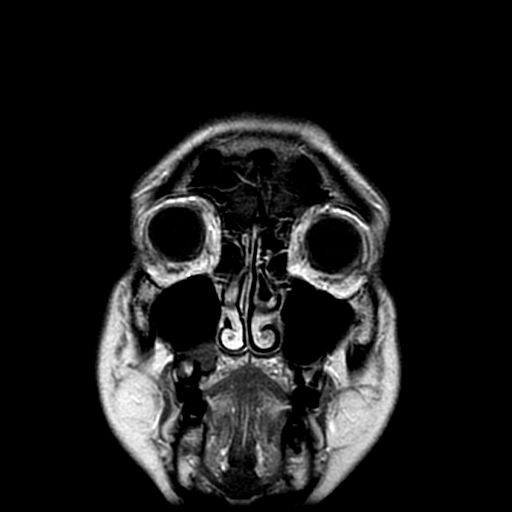

[Series 242: DWI · axial · 5.0mm · 0.94mm/px · z∈[-36,+93]mm · 4 of 25 slices shown (2 of 2)]
[im 1/25]
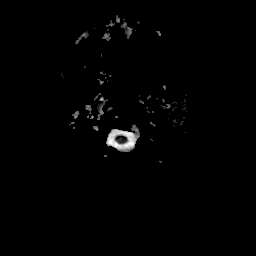
[im 9/25]
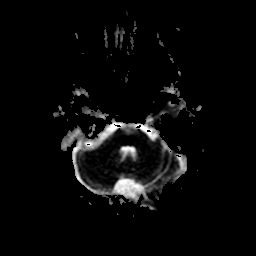
[im 17/25]
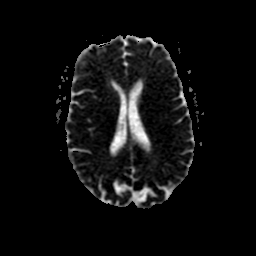
[im 25/25]
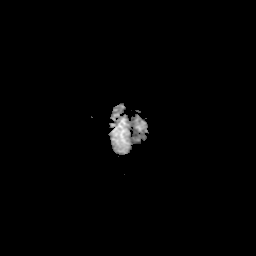

[27 of 48 positions shown; findings below may reference images not displayed]

FINDINGS: In the interval there has been left occipital craniotomy for resection of a mass.  On today?s study there is a 5 X 7 mm area of nodular enhancement in the craniotomy site, which is suspicious for some locally recurrently tumor.  It is possible this is postoperative enhancing granulation tissue.  No new metastatic deposits are identified in the brain.  
 The ventricles are not enlarged.  There is no edema or mass effect.  The patient had developed fluid throughout the right mastoid sinus.  Again noted is atrophy of the right parotid gland as noted previously.
IMPRESSION: 1.  5 X 7 mm area of nodular enhancement at the craniotomy site in the left occipital lobe, which is suspicious for some recurrent tumor.  No new lesions are identified elsewhere.  
 2.  Right mastoiditis, which is new and chronic atrophy of the right parotid gland.

## 2008-02-26 LAB — COMPREHENSIVE METABOLIC PANEL
ALT: 16 U/L (ref 0–35)
AST: 15 U/L (ref 0–37)
Albumin: 4.8 g/dL (ref 3.5–5.2)
Alkaline Phosphatase: 87 U/L (ref 39–117)
BUN: 15 mg/dL (ref 6–23)
CO2: 24 mEq/L (ref 19–32)
Calcium: 9.7 mg/dL (ref 8.4–10.5)
Chloride: 106 mEq/L (ref 96–112)
Creatinine, Ser: 0.87 mg/dL (ref 0.40–1.20)
Glucose, Bld: 101 mg/dL — ABNORMAL HIGH (ref 70–99)
Potassium: 3.8 mEq/L (ref 3.5–5.3)
Sodium: 140 mEq/L (ref 135–145)
Total Bilirubin: 1 mg/dL (ref 0.3–1.2)
Total Protein: 7 g/dL (ref 6.0–8.3)

## 2008-02-26 LAB — CBC WITH DIFFERENTIAL/PLATELET
BASO%: 0.5 % (ref 0.0–2.0)
Basophils Absolute: 0 10*3/uL (ref 0.0–0.1)
EOS%: 1.6 % (ref 0.0–7.0)
Eosinophils Absolute: 0.1 10*3/uL (ref 0.0–0.5)
HCT: 39.6 % (ref 34.8–46.6)
HGB: 13.8 g/dL (ref 11.6–15.9)
LYMPH%: 21.1 % (ref 14.0–48.0)
MCH: 31 pg (ref 26.0–34.0)
MCHC: 34.8 g/dL (ref 32.0–36.0)
MCV: 88.9 fL (ref 81.0–101.0)
MONO#: 0.2 10*3/uL (ref 0.1–0.9)
MONO%: 6 % (ref 0.0–13.0)
NEUT#: 2.9 10*3/uL (ref 1.5–6.5)
NEUT%: 70.8 % (ref 39.6–76.8)
Platelets: 211 10*3/uL (ref 145–400)
RBC: 4.46 10*6/uL (ref 3.70–5.32)
RDW: 13.3 % (ref 11.3–14.5)
WBC: 4.1 10*3/uL (ref 3.9–10.0)
lymph#: 0.9 10*3/uL (ref 0.9–3.3)

## 2008-03-02 ENCOUNTER — Ambulatory Visit (HOSPITAL_COMMUNITY): Admission: RE | Admit: 2008-03-02 | Discharge: 2008-03-02 | Payer: Self-pay | Admitting: Internal Medicine

## 2008-03-08 ENCOUNTER — Ambulatory Visit: Payer: Self-pay | Admitting: Internal Medicine

## 2008-04-08 LAB — CBC WITH DIFFERENTIAL/PLATELET
BASO%: 0.2 % (ref 0.0–2.0)
Basophils Absolute: 0 10*3/uL (ref 0.0–0.1)
EOS%: 2.4 % (ref 0.0–7.0)
Eosinophils Absolute: 0.1 10*3/uL (ref 0.0–0.5)
HCT: 39.3 % (ref 34.8–46.6)
HGB: 13.4 g/dL (ref 11.6–15.9)
LYMPH%: 22.2 % (ref 14.0–48.0)
MCH: 30.8 pg (ref 26.0–34.0)
MCHC: 34.1 g/dL (ref 32.0–36.0)
MCV: 90.5 fL (ref 81.0–101.0)
MONO#: 0.3 10*3/uL (ref 0.1–0.9)
MONO%: 7.9 % (ref 0.0–13.0)
NEUT#: 2.7 10*3/uL (ref 1.5–6.5)
NEUT%: 67.3 % (ref 39.6–76.8)
Platelets: 207 10*3/uL (ref 145–400)
RBC: 4.34 10*6/uL (ref 3.70–5.32)
RDW: 13.9 % (ref 11.3–14.5)
WBC: 4.1 10*3/uL (ref 3.9–10.0)
lymph#: 0.9 10*3/uL (ref 0.9–3.3)

## 2008-04-08 LAB — COMPREHENSIVE METABOLIC PANEL
ALT: 34 U/L (ref 0–35)
AST: 27 U/L (ref 0–37)
Albumin: 4.7 g/dL (ref 3.5–5.2)
Alkaline Phosphatase: 91 U/L (ref 39–117)
BUN: 16 mg/dL (ref 6–23)
CO2: 23 mEq/L (ref 19–32)
Calcium: 9.7 mg/dL (ref 8.4–10.5)
Chloride: 106 mEq/L (ref 96–112)
Creatinine, Ser: 1 mg/dL (ref 0.40–1.20)
Glucose, Bld: 95 mg/dL (ref 70–99)
Potassium: 4 mEq/L (ref 3.5–5.3)
Sodium: 139 mEq/L (ref 135–145)
Total Bilirubin: 0.9 mg/dL (ref 0.3–1.2)
Total Protein: 7 g/dL (ref 6.0–8.3)

## 2008-05-04 ENCOUNTER — Ambulatory Visit: Payer: Self-pay | Admitting: Internal Medicine

## 2008-05-06 LAB — COMPREHENSIVE METABOLIC PANEL
ALT: 28 U/L (ref 0–35)
AST: 18 U/L (ref 0–37)
Albumin: 4.6 g/dL (ref 3.5–5.2)
Alkaline Phosphatase: 83 U/L (ref 39–117)
BUN: 13 mg/dL (ref 6–23)
CO2: 22 mEq/L (ref 19–32)
Calcium: 9.7 mg/dL (ref 8.4–10.5)
Chloride: 105 mEq/L (ref 96–112)
Creatinine, Ser: 0.87 mg/dL (ref 0.40–1.20)
Glucose, Bld: 126 mg/dL — ABNORMAL HIGH (ref 70–99)
Potassium: 3.8 mEq/L (ref 3.5–5.3)
Sodium: 138 mEq/L (ref 135–145)
Total Bilirubin: 0.8 mg/dL (ref 0.3–1.2)
Total Protein: 6.8 g/dL (ref 6.0–8.3)

## 2008-05-06 LAB — CBC WITH DIFFERENTIAL/PLATELET
BASO%: 0.3 % (ref 0.0–2.0)
Basophils Absolute: 0 10*3/uL (ref 0.0–0.1)
EOS%: 1.2 % (ref 0.0–7.0)
Eosinophils Absolute: 0 10*3/uL (ref 0.0–0.5)
HCT: 39.1 % (ref 34.8–46.6)
HGB: 13.5 g/dL (ref 11.6–15.9)
LYMPH%: 23.5 % (ref 14.0–48.0)
MCH: 31.4 pg (ref 26.0–34.0)
MCHC: 34.5 g/dL (ref 32.0–36.0)
MCV: 90.9 fL (ref 81.0–101.0)
MONO#: 0.3 10*3/uL (ref 0.1–0.9)
MONO%: 8 % (ref 0.0–13.0)
NEUT#: 2.7 10*3/uL (ref 1.5–6.5)
NEUT%: 67 % (ref 39.6–76.8)
Platelets: 209 10*3/uL (ref 145–400)
RBC: 4.3 10*6/uL (ref 3.70–5.32)
RDW: 13.6 % (ref 11.3–14.5)
WBC: 4 10*3/uL (ref 3.9–10.0)
lymph#: 0.9 10*3/uL (ref 0.9–3.3)

## 2008-05-31 ENCOUNTER — Ambulatory Visit (HOSPITAL_COMMUNITY): Admission: RE | Admit: 2008-05-31 | Discharge: 2008-05-31 | Payer: Self-pay | Admitting: Internal Medicine

## 2008-06-03 LAB — CBC WITH DIFFERENTIAL/PLATELET
BASO%: 0.3 % (ref 0.0–2.0)
Basophils Absolute: 0 10*3/uL (ref 0.0–0.1)
EOS%: 1.2 % (ref 0.0–7.0)
Eosinophils Absolute: 0 10*3/uL (ref 0.0–0.5)
HCT: 42.3 % (ref 34.8–46.6)
HGB: 14.3 g/dL (ref 11.6–15.9)
LYMPH%: 16.8 % (ref 14.0–48.0)
MCH: 31 pg (ref 26.0–34.0)
MCHC: 33.9 g/dL (ref 32.0–36.0)
MCV: 91.4 fL (ref 81.0–101.0)
MONO#: 0.3 10*3/uL (ref 0.1–0.9)
MONO%: 6.7 % (ref 0.0–13.0)
NEUT#: 3 10*3/uL (ref 1.5–6.5)
NEUT%: 75 % (ref 39.6–76.8)
Platelets: 219 10*3/uL (ref 145–400)
RBC: 4.63 10*6/uL (ref 3.70–5.32)
RDW: 13.4 % (ref 11.3–14.5)
WBC: 4 10*3/uL (ref 3.9–10.0)
lymph#: 0.7 10*3/uL — ABNORMAL LOW (ref 0.9–3.3)

## 2008-06-03 LAB — COMPREHENSIVE METABOLIC PANEL
ALT: 27 U/L (ref 0–35)
AST: 19 U/L (ref 0–37)
Albumin: 4.8 g/dL (ref 3.5–5.2)
Alkaline Phosphatase: 93 U/L (ref 39–117)
BUN: 11 mg/dL (ref 6–23)
CO2: 26 mEq/L (ref 19–32)
Calcium: 9.8 mg/dL (ref 8.4–10.5)
Chloride: 107 mEq/L (ref 96–112)
Creatinine, Ser: 0.88 mg/dL (ref 0.40–1.20)
Glucose, Bld: 85 mg/dL (ref 70–99)
Potassium: 3.8 mEq/L (ref 3.5–5.3)
Sodium: 145 mEq/L (ref 135–145)
Total Bilirubin: 1.6 mg/dL — ABNORMAL HIGH (ref 0.3–1.2)
Total Protein: 7.1 g/dL (ref 6.0–8.3)

## 2008-06-28 ENCOUNTER — Ambulatory Visit: Payer: Self-pay | Admitting: Internal Medicine

## 2008-06-30 LAB — COMPREHENSIVE METABOLIC PANEL
ALT: 45 U/L — ABNORMAL HIGH (ref 0–35)
AST: 23 U/L (ref 0–37)
Albumin: 4.4 g/dL (ref 3.5–5.2)
Alkaline Phosphatase: 91 U/L (ref 39–117)
BUN: 15 mg/dL (ref 6–23)
CO2: 23 mEq/L (ref 19–32)
Calcium: 9.9 mg/dL (ref 8.4–10.5)
Chloride: 106 mEq/L (ref 96–112)
Creatinine, Ser: 0.86 mg/dL (ref 0.40–1.20)
Glucose, Bld: 99 mg/dL (ref 70–99)
Potassium: 3.6 mEq/L (ref 3.5–5.3)
Sodium: 140 mEq/L (ref 135–145)
Total Bilirubin: 0.9 mg/dL (ref 0.3–1.2)
Total Protein: 6.5 g/dL (ref 6.0–8.3)

## 2008-06-30 LAB — CBC WITH DIFFERENTIAL/PLATELET
BASO%: 0.2 % (ref 0.0–2.0)
Basophils Absolute: 0 10*3/uL (ref 0.0–0.1)
EOS%: 2.3 % (ref 0.0–7.0)
Eosinophils Absolute: 0.1 10*3/uL (ref 0.0–0.5)
HCT: 39.3 % (ref 34.8–46.6)
HGB: 13.1 g/dL (ref 11.6–15.9)
LYMPH%: 19 % (ref 14.0–48.0)
MCH: 31.1 pg (ref 26.0–34.0)
MCHC: 33.4 g/dL (ref 32.0–36.0)
MCV: 92.9 fL (ref 81.0–101.0)
MONO#: 0.4 10*3/uL (ref 0.1–0.9)
MONO%: 8.7 % (ref 0.0–13.0)
NEUT#: 3 10*3/uL (ref 1.5–6.5)
NEUT%: 69.8 % (ref 39.6–76.8)
Platelets: 208 10*3/uL (ref 145–400)
RBC: 4.23 10*6/uL (ref 3.70–5.32)
RDW: 13.4 % (ref 11.3–14.5)
WBC: 4.2 10*3/uL (ref 3.9–10.0)
lymph#: 0.8 10*3/uL — ABNORMAL LOW (ref 0.9–3.3)

## 2008-07-22 ENCOUNTER — Encounter: Payer: Self-pay | Admitting: Internal Medicine

## 2008-08-20 DIAGNOSIS — M810 Age-related osteoporosis without current pathological fracture: Secondary | ICD-10-CM

## 2008-08-20 HISTORY — DX: Age-related osteoporosis without current pathological fracture: M81.0

## 2008-10-26 ENCOUNTER — Ambulatory Visit: Payer: Self-pay | Admitting: Internal Medicine

## 2008-10-28 LAB — CBC WITH DIFFERENTIAL/PLATELET
BASO%: 0.4 % (ref 0.0–2.0)
Basophils Absolute: 0 10*3/uL (ref 0.0–0.1)
EOS%: 1.2 % (ref 0.0–7.0)
Eosinophils Absolute: 0 10*3/uL (ref 0.0–0.5)
HCT: 38.6 % (ref 34.8–46.6)
HGB: 13.1 g/dL (ref 11.6–15.9)
LYMPH%: 20.7 % (ref 14.0–49.7)
MCH: 31 pg (ref 25.1–34.0)
MCHC: 34 g/dL (ref 31.5–36.0)
MCV: 91 fL (ref 79.5–101.0)
MONO#: 0.3 10*3/uL (ref 0.1–0.9)
MONO%: 8.6 % (ref 0.0–14.0)
NEUT#: 2.8 10*3/uL (ref 1.5–6.5)
NEUT%: 69.1 % (ref 38.4–76.8)
Platelets: 221 10*3/uL (ref 145–400)
RBC: 4.25 10*6/uL (ref 3.70–5.45)
RDW: 13.5 % (ref 11.2–14.5)
WBC: 4.1 10*3/uL (ref 3.9–10.3)
lymph#: 0.8 10*3/uL — ABNORMAL LOW (ref 0.9–3.3)

## 2008-10-28 LAB — COMPREHENSIVE METABOLIC PANEL
ALT: 27 U/L (ref 0–35)
AST: 17 U/L (ref 0–37)
Albumin: 4.5 g/dL (ref 3.5–5.2)
Alkaline Phosphatase: 82 U/L (ref 39–117)
BUN: 12 mg/dL (ref 6–23)
CO2: 22 mEq/L (ref 19–32)
Calcium: 9.3 mg/dL (ref 8.4–10.5)
Chloride: 106 mEq/L (ref 96–112)
Creatinine, Ser: 0.89 mg/dL (ref 0.40–1.20)
Glucose, Bld: 91 mg/dL (ref 70–99)
Potassium: 4 mEq/L (ref 3.5–5.3)
Sodium: 141 mEq/L (ref 135–145)
Total Bilirubin: 1 mg/dL (ref 0.3–1.2)
Total Protein: 6.6 g/dL (ref 6.0–8.3)

## 2008-11-24 ENCOUNTER — Ambulatory Visit (HOSPITAL_COMMUNITY): Admission: RE | Admit: 2008-11-24 | Discharge: 2008-11-24 | Payer: Self-pay | Admitting: Internal Medicine

## 2008-12-27 ENCOUNTER — Ambulatory Visit: Payer: Self-pay | Admitting: Internal Medicine

## 2008-12-29 LAB — CBC WITH DIFFERENTIAL/PLATELET
BASO%: 0.5 % (ref 0.0–2.0)
Basophils Absolute: 0 10*3/uL (ref 0.0–0.1)
EOS%: 1.7 % (ref 0.0–7.0)
Eosinophils Absolute: 0.1 10*3/uL (ref 0.0–0.5)
HCT: 39.8 % (ref 34.8–46.6)
HGB: 13.3 g/dL (ref 11.6–15.9)
LYMPH%: 16.6 % (ref 14.0–49.7)
MCH: 30.6 pg (ref 25.1–34.0)
MCHC: 33.5 g/dL (ref 31.5–36.0)
MCV: 91.5 fL (ref 79.5–101.0)
MONO#: 0.3 10*3/uL (ref 0.1–0.9)
MONO%: 6.7 % (ref 0.0–14.0)
NEUT#: 3.4 10*3/uL (ref 1.5–6.5)
NEUT%: 74.5 % (ref 38.4–76.8)
Platelets: 211 10*3/uL (ref 145–400)
RBC: 4.35 10*6/uL (ref 3.70–5.45)
RDW: 13.4 % (ref 11.2–14.5)
WBC: 4.6 10*3/uL (ref 3.9–10.3)
lymph#: 0.8 10*3/uL — ABNORMAL LOW (ref 0.9–3.3)

## 2008-12-29 LAB — COMPREHENSIVE METABOLIC PANEL
ALT: 20 U/L (ref 0–35)
AST: 15 U/L (ref 0–37)
Albumin: 4.4 g/dL (ref 3.5–5.2)
Alkaline Phosphatase: 85 U/L (ref 39–117)
BUN: 13 mg/dL (ref 6–23)
CO2: 21 mEq/L (ref 19–32)
Calcium: 9.6 mg/dL (ref 8.4–10.5)
Chloride: 105 mEq/L (ref 96–112)
Creatinine, Ser: 0.71 mg/dL (ref 0.40–1.20)
Glucose, Bld: 95 mg/dL (ref 70–99)
Potassium: 3.9 mEq/L (ref 3.5–5.3)
Sodium: 137 mEq/L (ref 135–145)
Total Bilirubin: 1 mg/dL (ref 0.3–1.2)
Total Protein: 6.7 g/dL (ref 6.0–8.3)

## 2009-01-18 ENCOUNTER — Encounter: Admission: RE | Admit: 2009-01-18 | Discharge: 2009-01-18 | Payer: Self-pay | Admitting: Obstetrics and Gynecology

## 2009-02-22 ENCOUNTER — Ambulatory Visit: Payer: Self-pay | Admitting: Internal Medicine

## 2009-02-22 IMAGING — CT CT CHEST W/ CM
2 of 3 series · 15 of 36 positions shown, 18 images · IV contrast (omnipaque)
Comparison: Chest CTs 07/30/06 and 10/30/06.

CLINICAL DATA: Lung cancer diagnosed in 1775 post chemotherapy and radiation therapy.  
 CHEST CT WITH CONTRAST:
TECHNIQUE: Multidetector CT imaging of the chest was performed following the standard protocol during bolus administration of intravenous contrast.
 Contrast:  80 cc Omnipaque 300

[Series 2: chest routine 5.0 b40f · axial · 0.64mm/px · z∈[-83,+177]mm · 12 of 62 slices shown, 15 images]
[im 5/62  mediastinal]
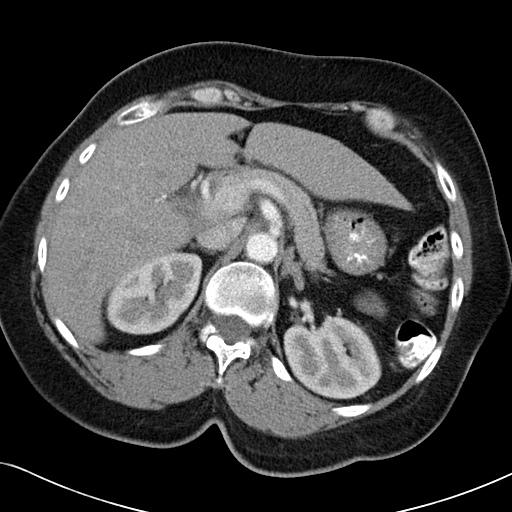
[im 5/62  lung]
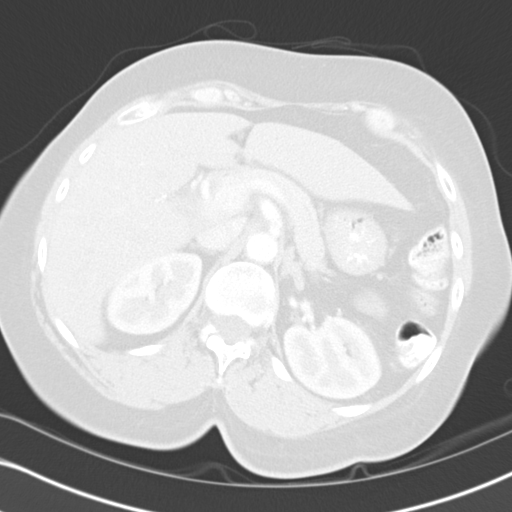
[im 10/62  lung]
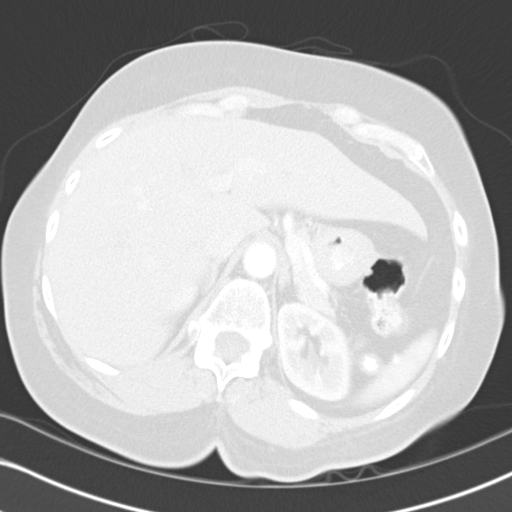
[im 14/62  lung]
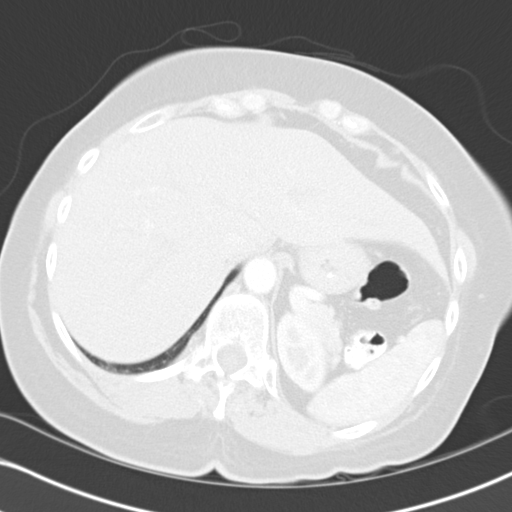
[im 19/62  lung]
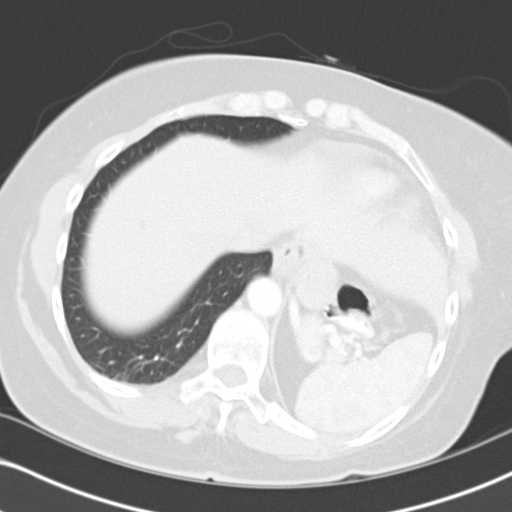
[im 23/62  mediastinal]
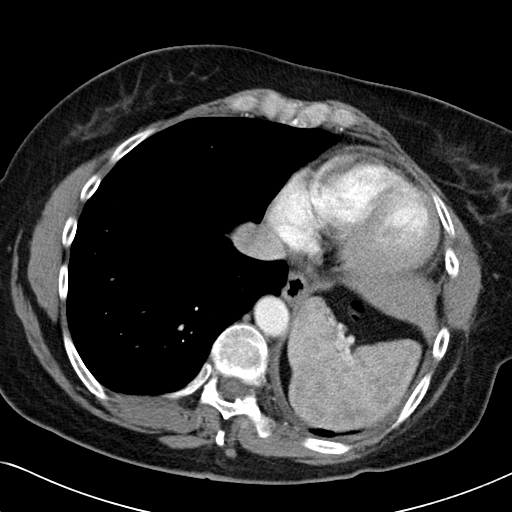
[im 23/62  lung]
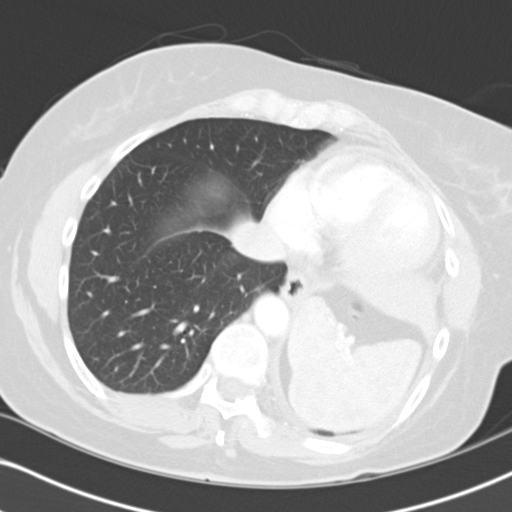
[im 28/62  lung]
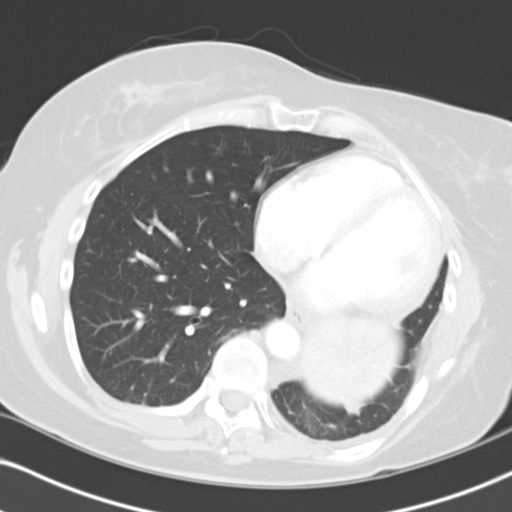
[im 34/62  lung]
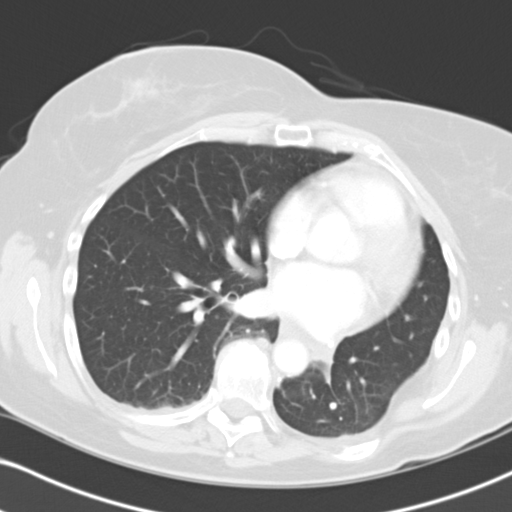
[im 39/62  lung]
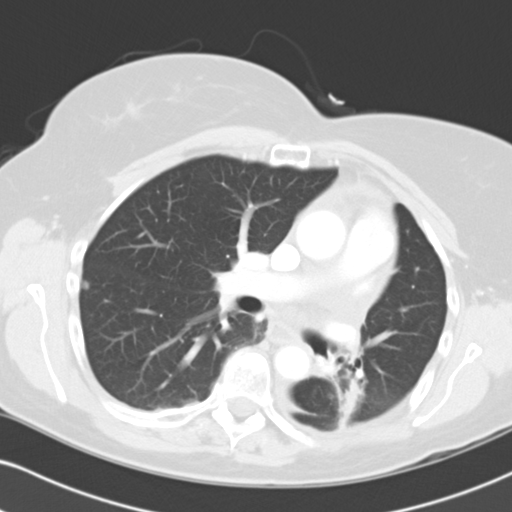
[im 43/62  mediastinal]
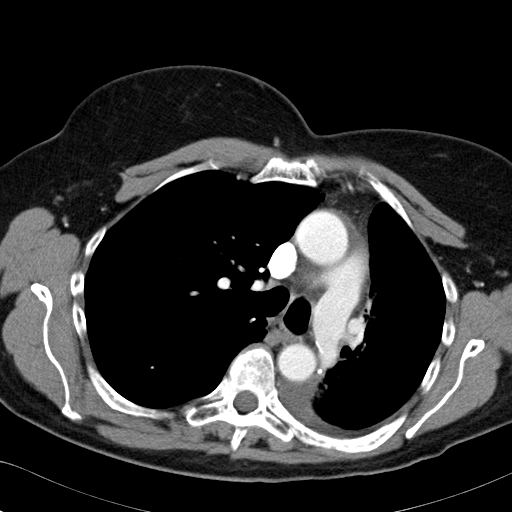
[im 43/62  lung]
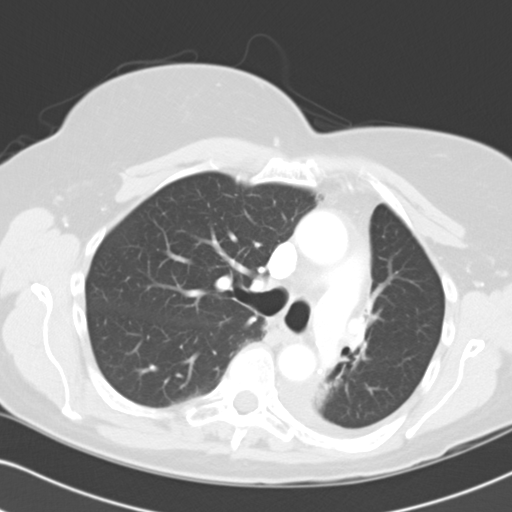
[im 48/62  lung]
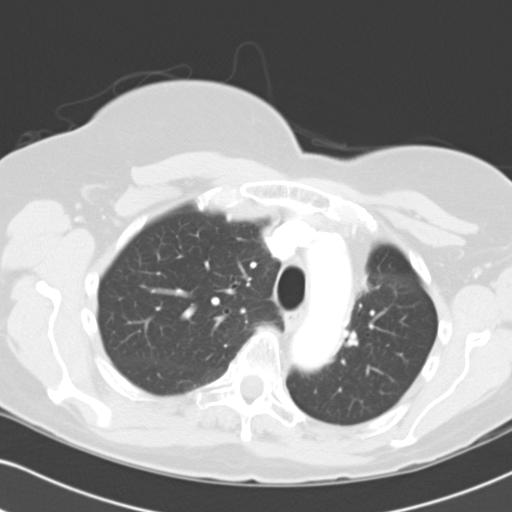
[im 52/62  lung]
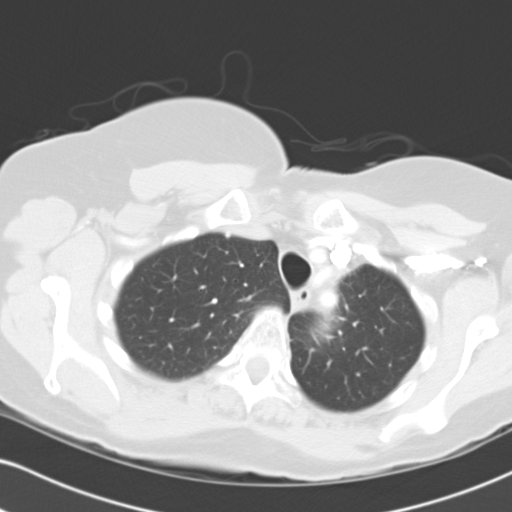
[im 57/62  lung]
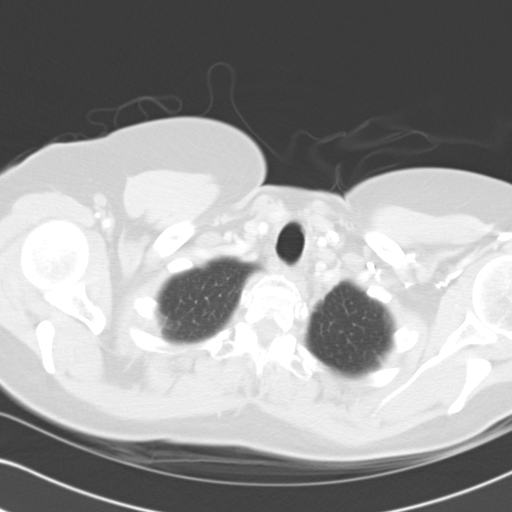

[Series 602: <mpr range> · coronal · 0.64mm/px · 3 of 113 slices shown]
[im 23/113  lung]
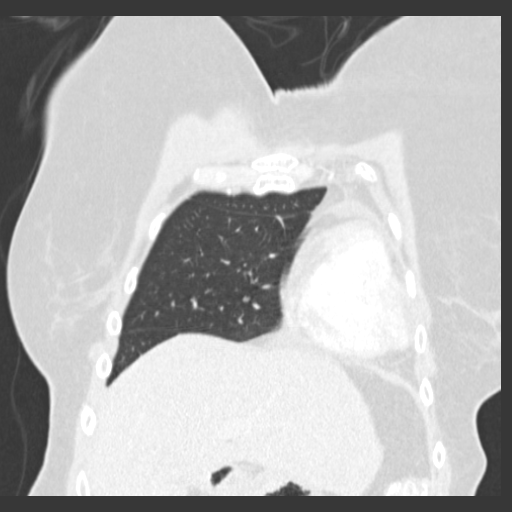
[im 45/113  lung]
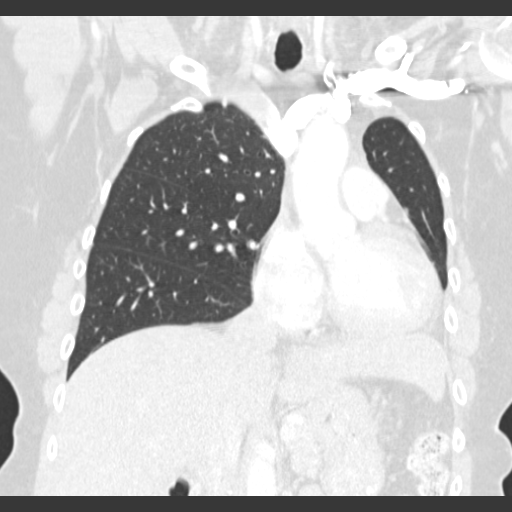
[im 68/113  lung]
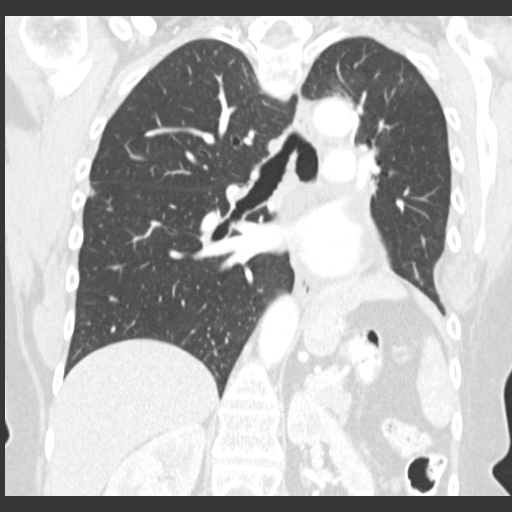

[15 of 36 positions shown; findings below may reference images not displayed]

FINDINGS: There is stable volume loss in the left hemithorax status post thoracotomy and lower lobe resection.  Areas of bronchiectasis and scarring posteromedially in the left lung appear stable.  There is no endobronchial lesion.  Adjacent loculated pleural fluid posteromedially in the left hemithorax appears stable.  There is a stable small amount of pericardial fluid.  No enlarged mediastinal or hilar lymph nodes are seen. 
 There is a stable, small partially calcified lesion inferiorly in the right lobe of the thyroid.  Several pulmonary nodules are again demonstrated bilaterally.  As correlated with the patient?s prior examinations, these appear to be slowly growing.  One medially in the right upper lobe measures 4 mm on image 14 compared with 2 mm on the most recent examination.  There is a 6 mm lesion more anteriorly in the right upper lobe on image 18 which measured 5 mm previously.  A subpleural nodule in the right lower lobe measures 6 mm on image 24 compared with 5 mm previously.  There is a 7 mm nodule in the left upper lobe on image 26.  This does not appear significantly changed from the most recent examinations, but does appear to be slowly growing over the last 18 months.  
 No suspicious osseous lesions are seen.  The imaged upper abdomen has a stable appearance with a stable 5 mm low density lesion in the dome of the right hepatic lobe on image 44.  There is no adrenal mass.
IMPRESSION: 1.  Slowly growing pulmonary nodules bilaterally most compatible with metastases. 
 2.  Otherwise, stable postoperative chest with stable loculated pleural fluid and scarring medially in the left hemithorax.
 3.  Stable low density lesion in the dome of the right hepatic lobe.

## 2009-02-24 ENCOUNTER — Ambulatory Visit (HOSPITAL_COMMUNITY): Admission: RE | Admit: 2009-02-24 | Discharge: 2009-02-24 | Payer: Self-pay | Admitting: Internal Medicine

## 2009-02-24 LAB — COMPREHENSIVE METABOLIC PANEL
ALT: 20 U/L (ref 0–35)
AST: 24 U/L (ref 0–37)
Albumin: 4 g/dL (ref 3.5–5.2)
Alkaline Phosphatase: 82 U/L (ref 39–117)
BUN: 10 mg/dL (ref 6–23)
CO2: 24 mEq/L (ref 19–32)
Calcium: 9.6 mg/dL (ref 8.4–10.5)
Chloride: 104 mEq/L (ref 96–112)
Creatinine, Ser: 0.92 mg/dL (ref 0.40–1.20)
Glucose, Bld: 118 mg/dL — ABNORMAL HIGH (ref 70–99)
Potassium: 3.3 mEq/L — ABNORMAL LOW (ref 3.5–5.3)
Sodium: 139 mEq/L (ref 135–145)
Total Bilirubin: 1.5 mg/dL — ABNORMAL HIGH (ref 0.3–1.2)
Total Protein: 6.5 g/dL (ref 6.0–8.3)

## 2009-02-24 LAB — CBC WITH DIFFERENTIAL/PLATELET
BASO%: 0.4 % (ref 0.0–2.0)
Basophils Absolute: 0 10*3/uL (ref 0.0–0.1)
EOS%: 8.1 % — ABNORMAL HIGH (ref 0.0–7.0)
Eosinophils Absolute: 0.3 10*3/uL (ref 0.0–0.5)
HCT: 39.9 % (ref 34.8–46.6)
HGB: 13.5 g/dL (ref 11.6–15.9)
LYMPH%: 18.4 % (ref 14.0–49.7)
MCH: 30.8 pg (ref 25.1–34.0)
MCHC: 33.9 g/dL (ref 31.5–36.0)
MCV: 90.9 fL (ref 79.5–101.0)
MONO#: 0.3 10*3/uL (ref 0.1–0.9)
MONO%: 7.2 % (ref 0.0–14.0)
NEUT#: 2.4 10*3/uL (ref 1.5–6.5)
NEUT%: 65.9 % (ref 38.4–76.8)
Platelets: 205 10*3/uL (ref 145–400)
RBC: 4.39 10*6/uL (ref 3.70–5.45)
RDW: 13.1 % (ref 11.2–14.5)
WBC: 3.6 10*3/uL — ABNORMAL LOW (ref 3.9–10.3)
lymph#: 0.7 10*3/uL — ABNORMAL LOW (ref 0.9–3.3)

## 2009-04-06 ENCOUNTER — Encounter: Admission: RE | Admit: 2009-04-06 | Discharge: 2009-04-06 | Payer: Self-pay | Admitting: Obstetrics and Gynecology

## 2009-04-13 ENCOUNTER — Telehealth: Payer: Self-pay | Admitting: Gastroenterology

## 2009-04-22 DIAGNOSIS — C3411 Malignant neoplasm of upper lobe, right bronchus or lung: Secondary | ICD-10-CM

## 2009-04-22 DIAGNOSIS — F0390 Unspecified dementia without behavioral disturbance: Secondary | ICD-10-CM | POA: Insufficient documentation

## 2009-04-22 DIAGNOSIS — C719 Malignant neoplasm of brain, unspecified: Secondary | ICD-10-CM | POA: Insufficient documentation

## 2009-04-22 DIAGNOSIS — E559 Vitamin D deficiency, unspecified: Secondary | ICD-10-CM | POA: Insufficient documentation

## 2009-04-22 DIAGNOSIS — M810 Age-related osteoporosis without current pathological fracture: Secondary | ICD-10-CM | POA: Insufficient documentation

## 2009-04-22 DIAGNOSIS — K603 Anal fistula: Secondary | ICD-10-CM | POA: Insufficient documentation

## 2009-04-22 HISTORY — DX: Malignant neoplasm of upper lobe, right bronchus or lung: C34.11

## 2009-04-26 ENCOUNTER — Ambulatory Visit: Payer: Self-pay | Admitting: Gastroenterology

## 2009-04-26 DIAGNOSIS — R197 Diarrhea, unspecified: Secondary | ICD-10-CM | POA: Insufficient documentation

## 2009-04-26 DIAGNOSIS — K6289 Other specified diseases of anus and rectum: Secondary | ICD-10-CM | POA: Insufficient documentation

## 2009-05-21 IMAGING — CT CT CHEST W/ CM
2 of 3 series · 15 of 36 positions shown, 18 images · non-contrast
Comparison: 02/27/2007

CLINICAL DATA: Lung cancer
TECHNIQUE: Multidetector CT imaging of the chest was performed following the
standard protocol during bolus administration of intravenous contrast.

[Series 2: chest routine 5.0 b40f · axial · 0.60mm/px · z∈[-294,-40]mm · 12 of 61 slices shown, 15 images]
[im 5/61  mediastinal]
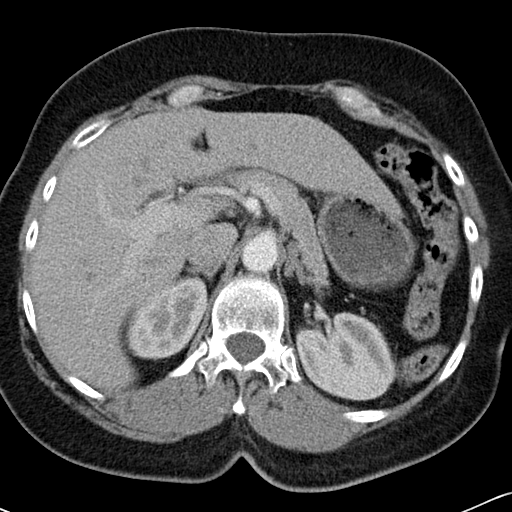
[im 5/61  lung]
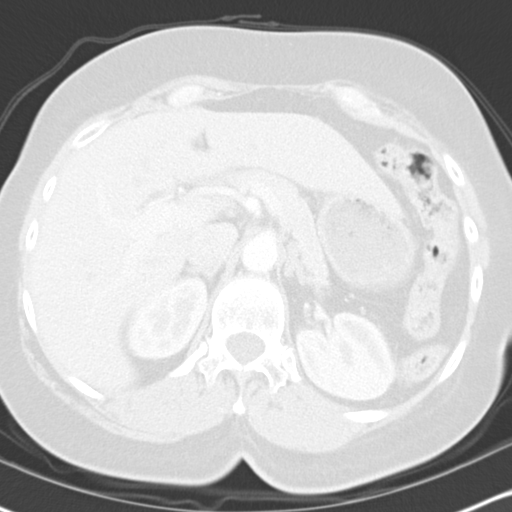
[im 9/61  lung]
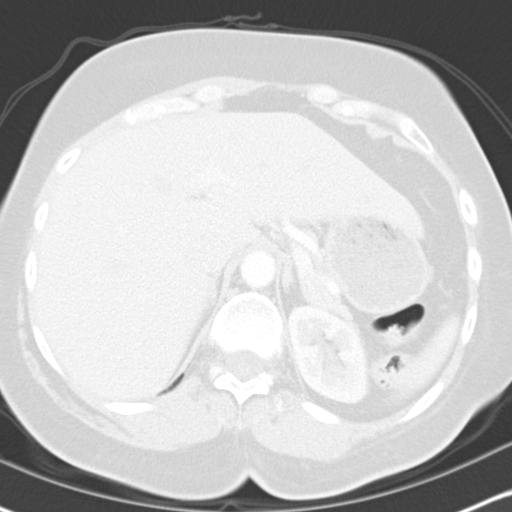
[im 14/61  lung]
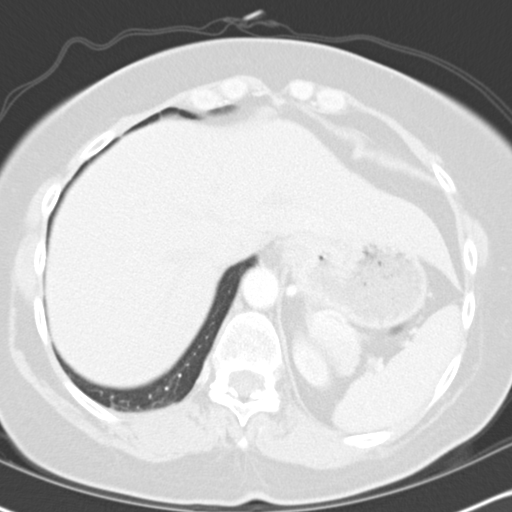
[im 18/61  lung]
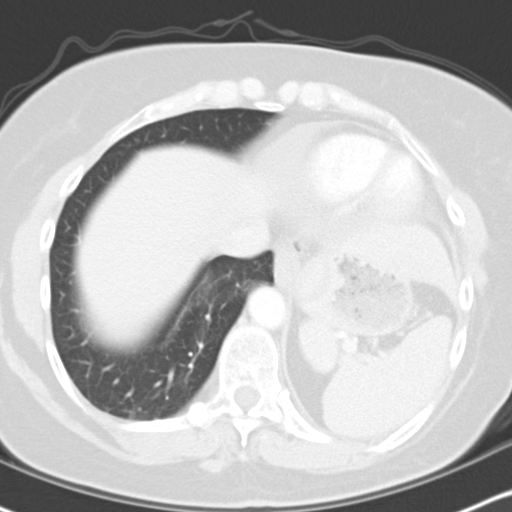
[im 23/61  mediastinal]
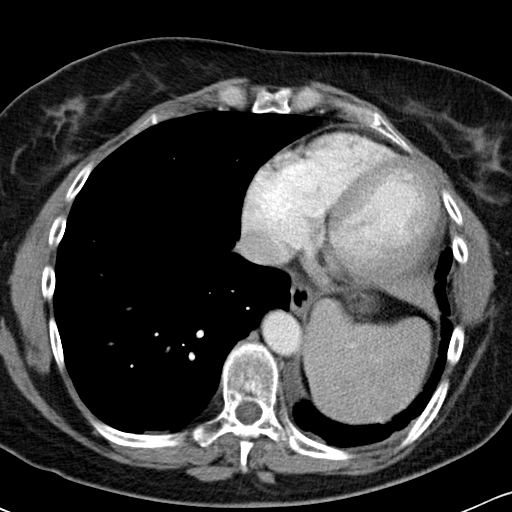
[im 23/61  lung]
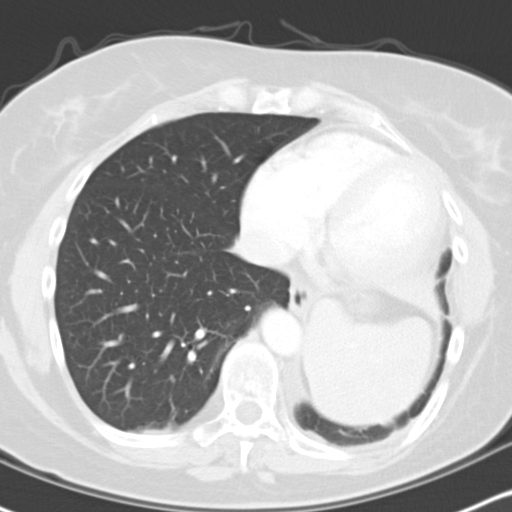
[im 27/61  lung]
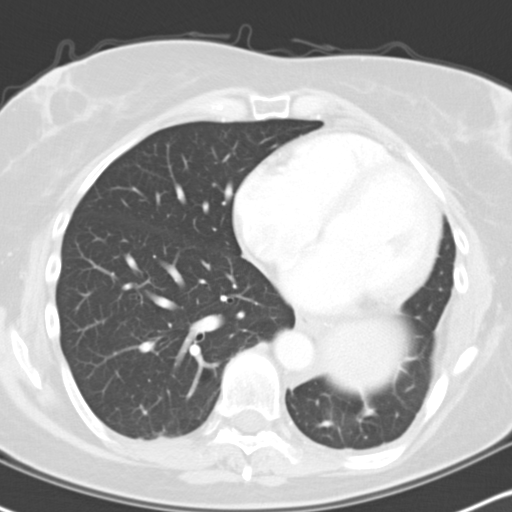
[im 34/61  lung]
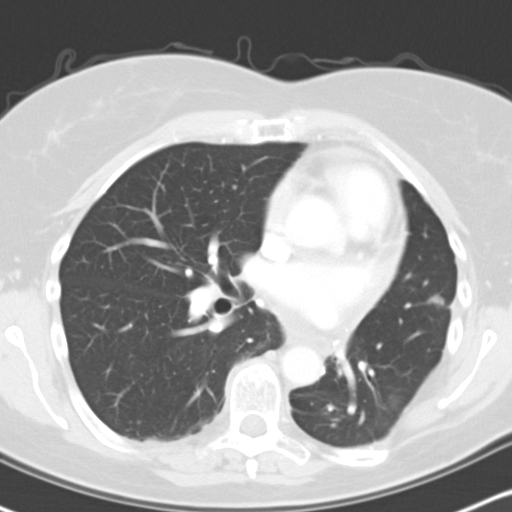
[im 38/61  lung]
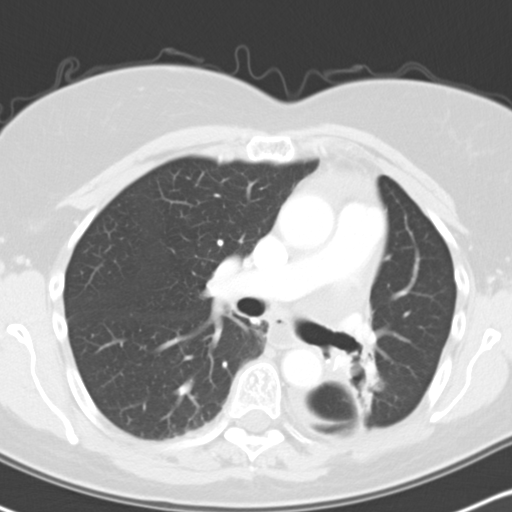
[im 43/61  mediastinal]
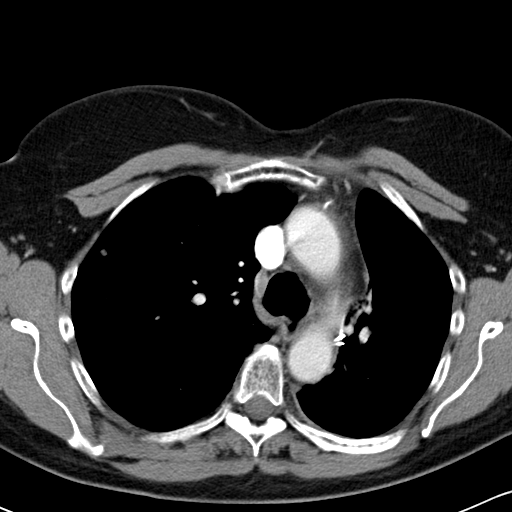
[im 43/61  lung]
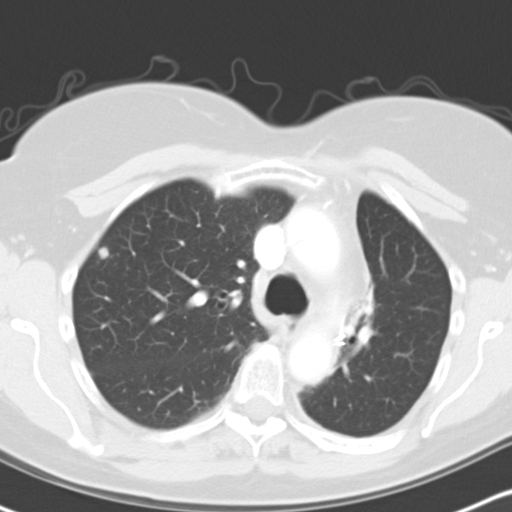
[im 47/61  lung]
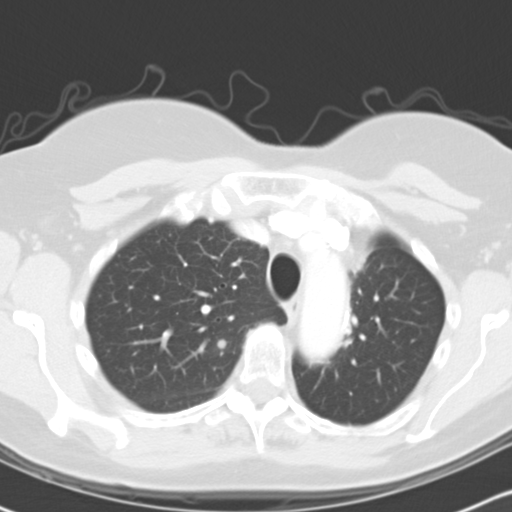
[im 52/61  lung]
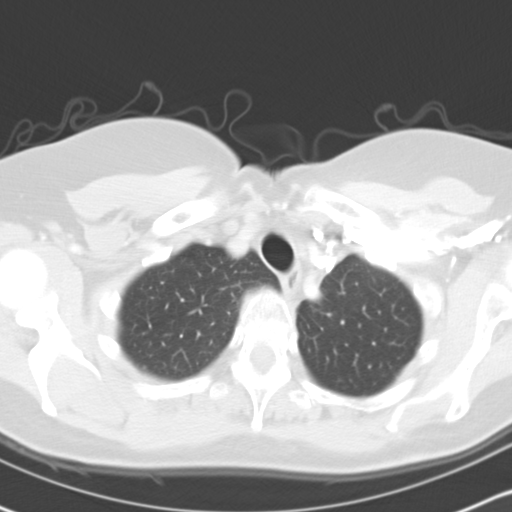
[im 56/61  lung]
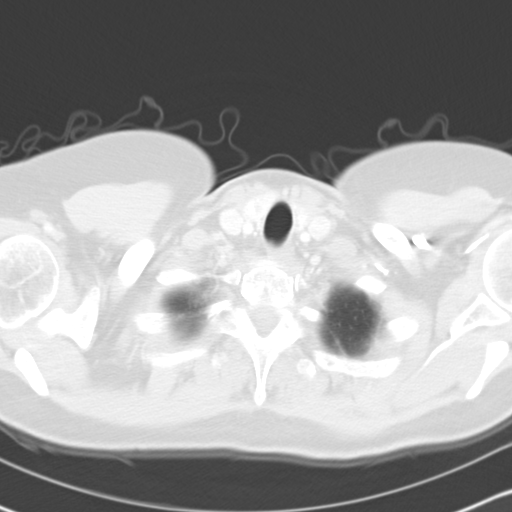

[Series 602: <mpr thick range> · coronal · 0.60mm/px · 3 of 110 slices shown]
[im 22/110  lung]
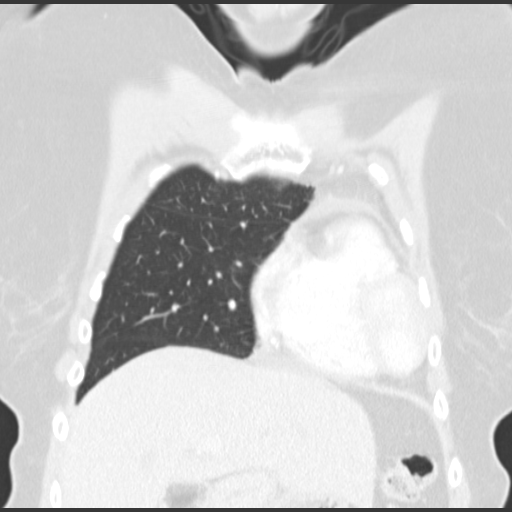
[im 44/110  lung]
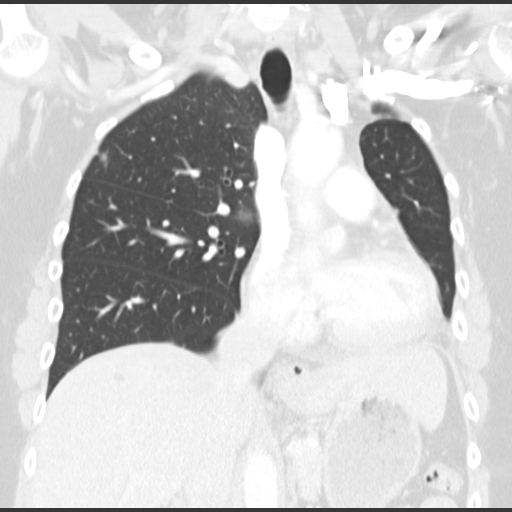
[im 66/110  lung]
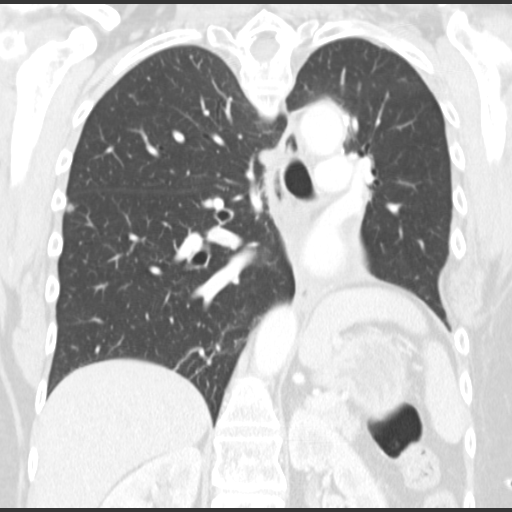

[15 of 36 positions shown; findings below may reference images not displayed]

Contrast:  80 cc Omnipaque 300

CHEST CT WITH CONTRAST:

There is no axillary adenopathy. Tiny left thyroid nodule is stable as is the
calcified right thyroid nodule. No right hilar adenopathy. There is no
pericardial effusion. No right pleural effusion. Mild left pleural thickening is
stable. Postsurgical change in the left hilum is again noted. Posttreatment
changes are seen in the medial left lung, stable. Fluid within the distal
esophagus suggests dysmotility. The scattered small pulmonary nodules have
continued to progress. An index nodule in the right upper lobe measured 6 mm
previously and measures 8 mm today. A left lung nodule on image 27 measures 9 mm
today compared to 7 mm previously. Other scattered smaller pulmonary nodules
seen bilaterally have also progressed.

Imaging through the upper abdomen reveals no change in the tiny hypodensity at
the liver dome. Diffuse thickening of the left adrenal gland is unchanged
without a discrete nodule.
IMPRESSION: Interval progression of small bilateral pulmonary nodules, compatible with
metastatic disease.

## 2009-05-26 ENCOUNTER — Ambulatory Visit: Payer: Self-pay | Admitting: Internal Medicine

## 2009-05-30 ENCOUNTER — Ambulatory Visit (HOSPITAL_COMMUNITY): Admission: RE | Admit: 2009-05-30 | Discharge: 2009-05-30 | Payer: Self-pay | Admitting: Internal Medicine

## 2009-05-30 LAB — CBC WITH DIFFERENTIAL/PLATELET
BASO%: 0.5 % (ref 0.0–2.0)
Basophils Absolute: 0 10*3/uL (ref 0.0–0.1)
EOS%: 7.4 % — ABNORMAL HIGH (ref 0.0–7.0)
Eosinophils Absolute: 0.3 10*3/uL (ref 0.0–0.5)
HCT: 38.6 % (ref 34.8–46.6)
HGB: 13 g/dL (ref 11.6–15.9)
LYMPH%: 19.6 % (ref 14.0–49.7)
MCH: 30.7 pg (ref 25.1–34.0)
MCHC: 33.7 g/dL (ref 31.5–36.0)
MCV: 91.2 fL (ref 79.5–101.0)
MONO#: 0.4 10*3/uL (ref 0.1–0.9)
MONO%: 9.4 % (ref 0.0–14.0)
NEUT#: 2.7 10*3/uL (ref 1.5–6.5)
NEUT%: 63.1 % (ref 38.4–76.8)
Platelets: 221 10*3/uL (ref 145–400)
RBC: 4.23 10*6/uL (ref 3.70–5.45)
RDW: 13.6 % (ref 11.2–14.5)
WBC: 4.2 10*3/uL (ref 3.9–10.3)
lymph#: 0.8 10*3/uL — ABNORMAL LOW (ref 0.9–3.3)

## 2009-05-30 LAB — COMPREHENSIVE METABOLIC PANEL
ALT: 25 U/L (ref 0–35)
AST: 19 U/L (ref 0–37)
Albumin: 3.9 g/dL (ref 3.5–5.2)
Alkaline Phosphatase: 67 U/L (ref 39–117)
BUN: 9 mg/dL (ref 6–23)
CO2: 27 mEq/L (ref 19–32)
Calcium: 9.5 mg/dL (ref 8.4–10.5)
Chloride: 105 mEq/L (ref 96–112)
Creatinine, Ser: 0.81 mg/dL (ref 0.40–1.20)
Glucose, Bld: 97 mg/dL (ref 70–99)
Potassium: 4 mEq/L (ref 3.5–5.3)
Sodium: 138 mEq/L (ref 135–145)
Total Bilirubin: 1.2 mg/dL (ref 0.3–1.2)
Total Protein: 6.5 g/dL (ref 6.0–8.3)

## 2009-08-18 ENCOUNTER — Ambulatory Visit: Payer: Self-pay | Admitting: Internal Medicine

## 2009-08-23 ENCOUNTER — Ambulatory Visit (HOSPITAL_COMMUNITY): Admission: RE | Admit: 2009-08-23 | Discharge: 2009-08-23 | Payer: Self-pay | Admitting: Internal Medicine

## 2009-08-24 IMAGING — CT CT ABDOMEN W/ CM
2 of 5 series · 16 of 46 positions shown, 18 images · IV contrast (omnipaque)
Comparison: 05/26/07 chest CT and 10/30/06 abdomen and pelvis CT.

CLINICAL DATA: 61-year-old with lung cancer.  Tarceva treatment in progress.  History of radiation therapy.  History of left lower lobectomy.
 CHEST CT WITH CONTRAST:
TECHNIQUE: Multidetector CT imaging of the chest was performed following the standard protocol during bolus administration of intravenous contrast.
 Contrast:  100 cc Omnipaque 300.
TECHNIQUE: Multidetector CT imaging of the abdomen was performed following the standard protocol during bolus administration of intravenous contrast.
TECHNIQUE: Multidetector CT imaging of the pelvis was performed following the standard protocol during bolus administration of intravenous contrast.

[Series 2: cap 5.0 b40f · axial · 0.68mm/px · z∈[-617,-52]mm · 13 of 129 slices shown, 15 images]
[im 8/129  soft-tissue]
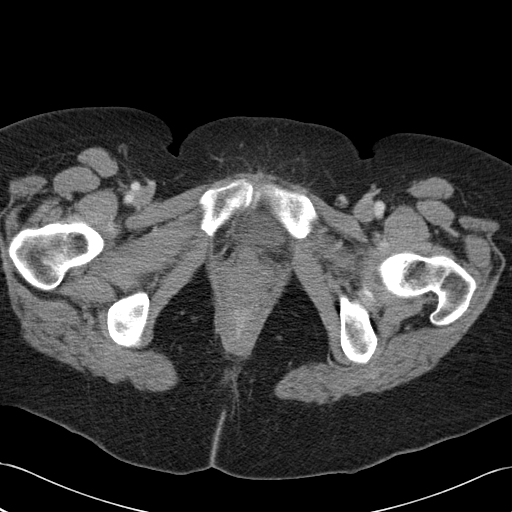
[im 8/129  bone]
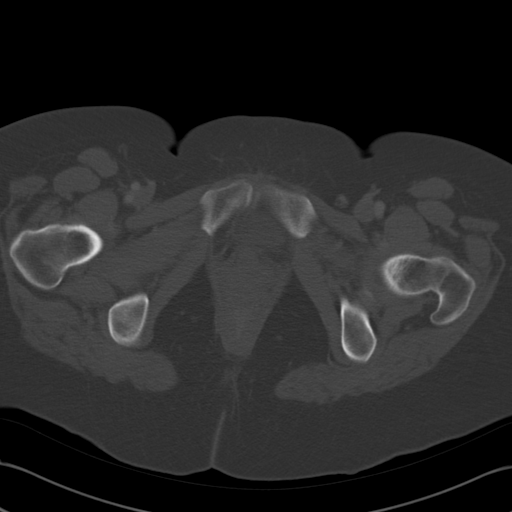
[im 15/129  soft-tissue]
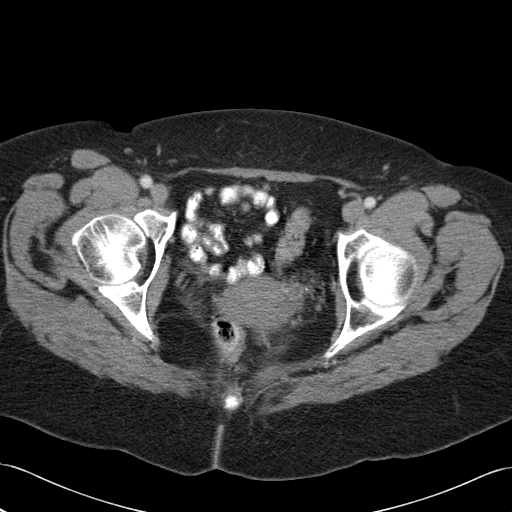
[im 29/129  soft-tissue]
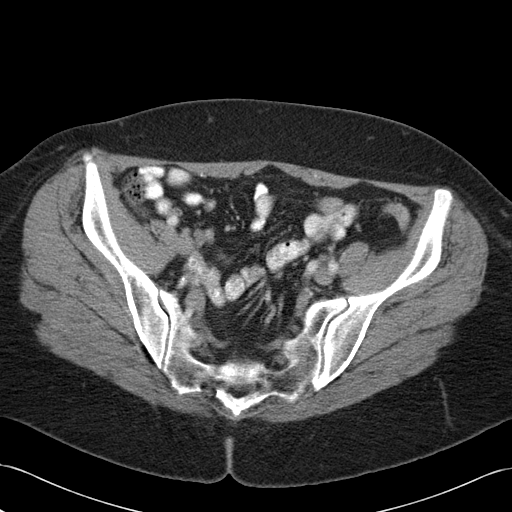
[im 36/129  soft-tissue]
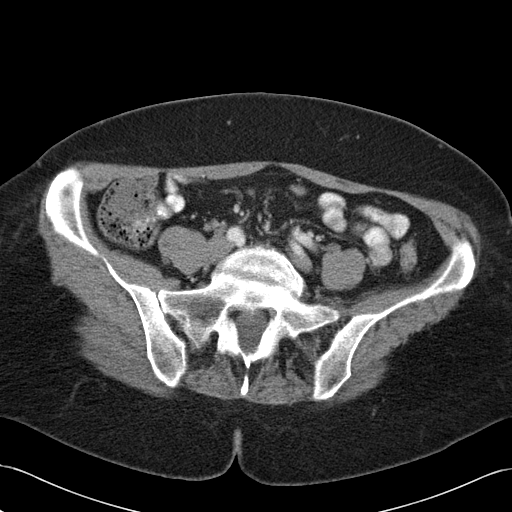
[im 43/129  soft-tissue]
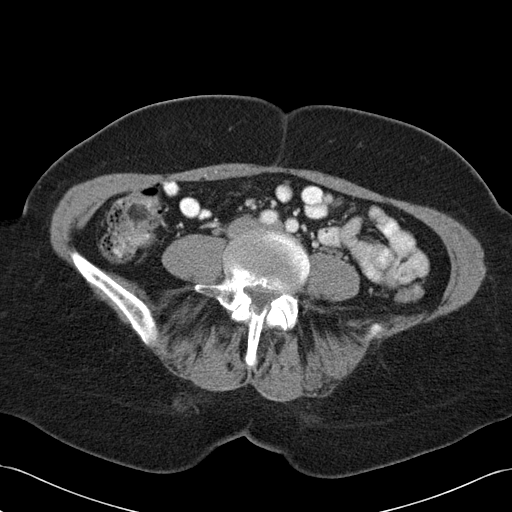
[im 57/129  soft-tissue]
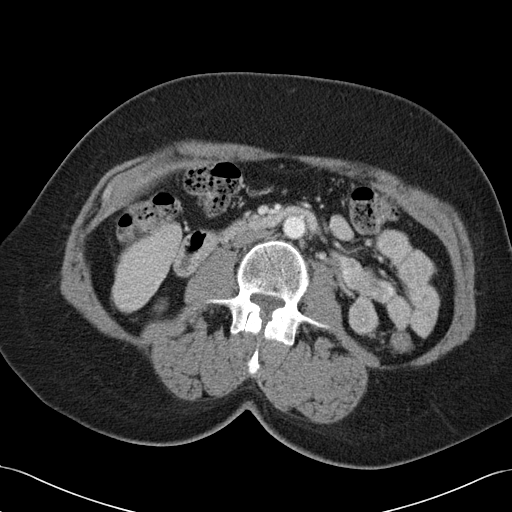
[im 65/129  soft-tissue]
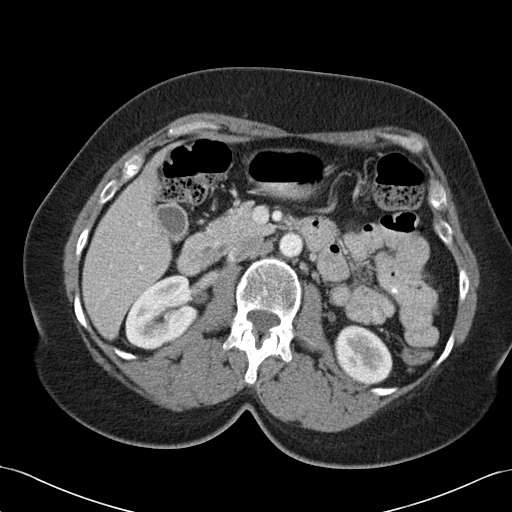
[im 72/129  soft-tissue]
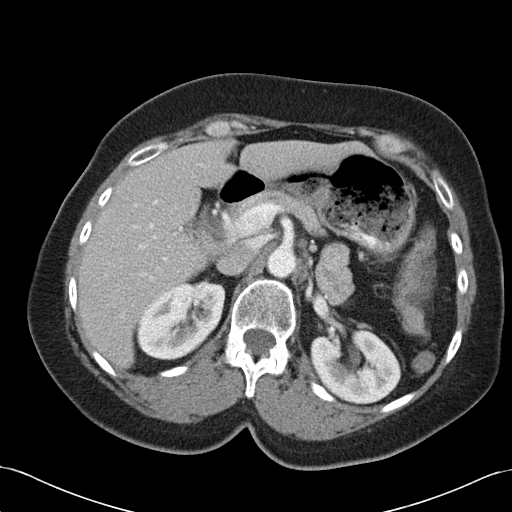
[im 86/129  soft-tissue]
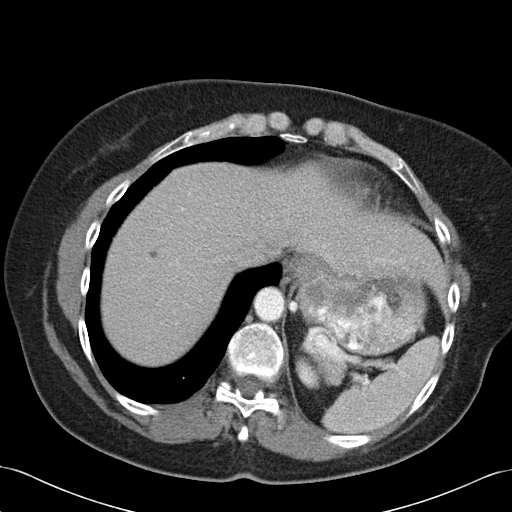
[im 86/129  bone]
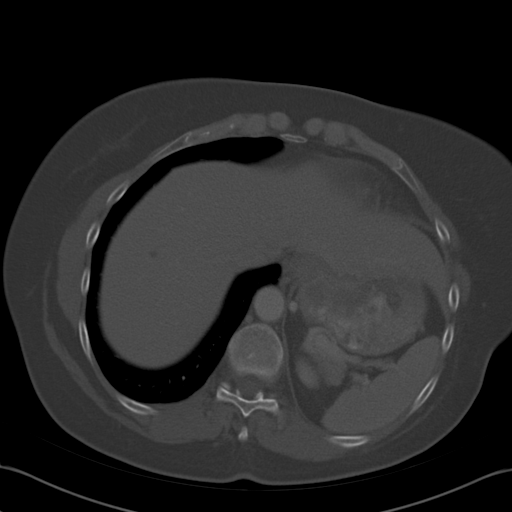
[im 93/129  soft-tissue]
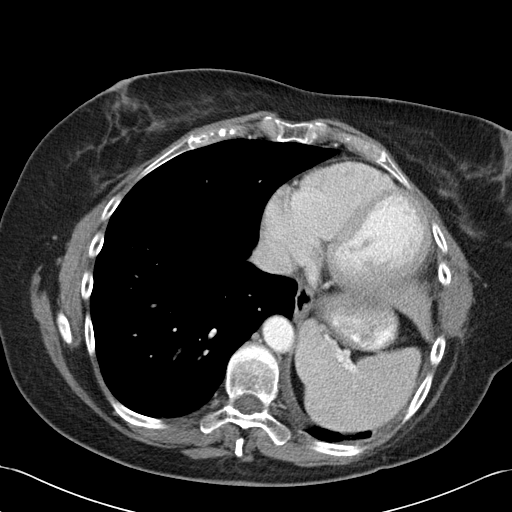
[im 100/129  soft-tissue]
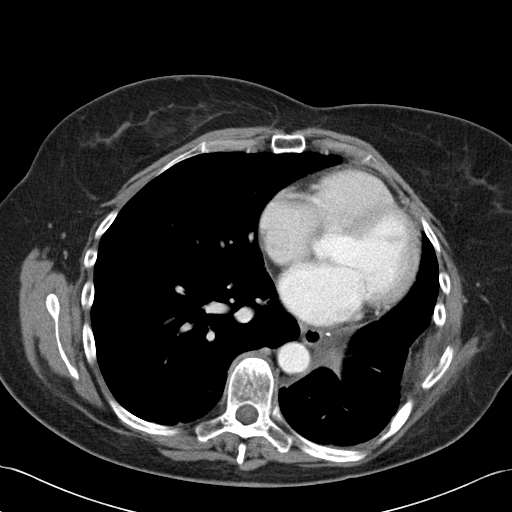
[im 114/129  soft-tissue]
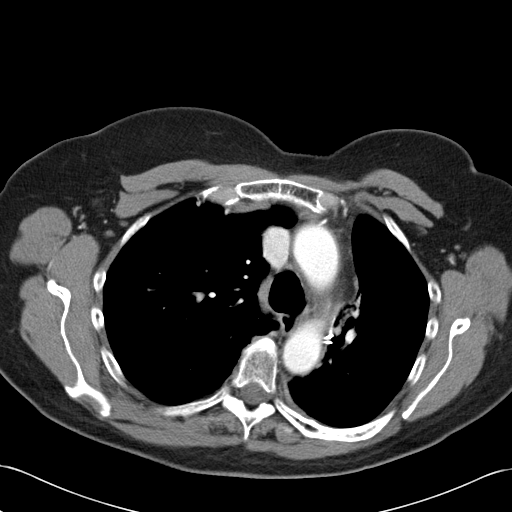
[im 121/129  soft-tissue]
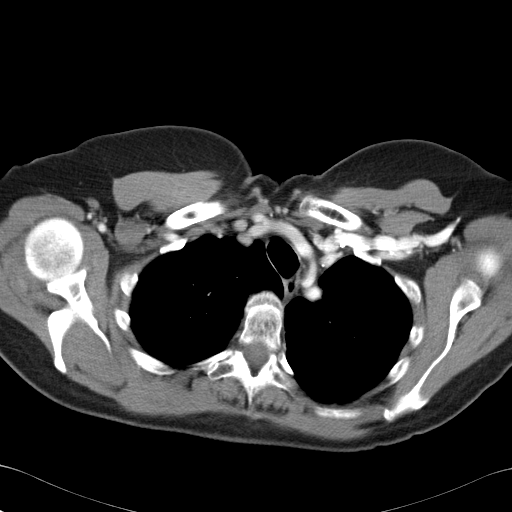

[Series 603: <mpr thick range(1)> · coronal · 1.26mm/px · 3 of 99 slices shown]
[im 33/99  soft-tissue]
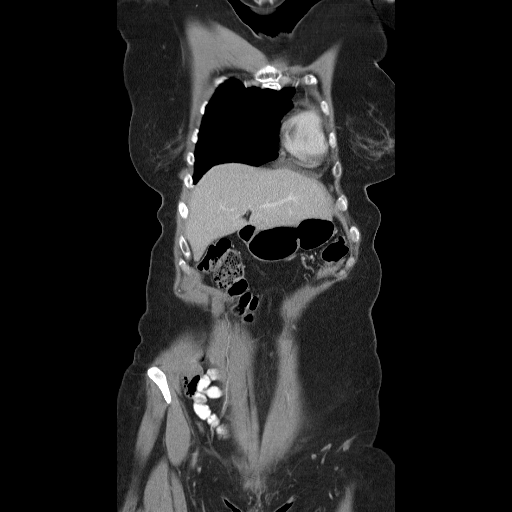
[im 44/99  soft-tissue]
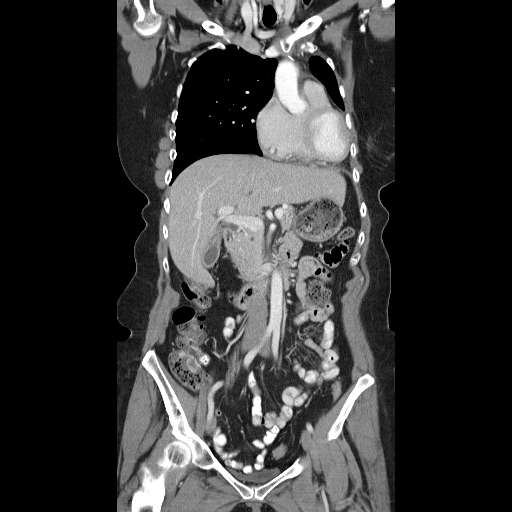
[im 55/99  soft-tissue]
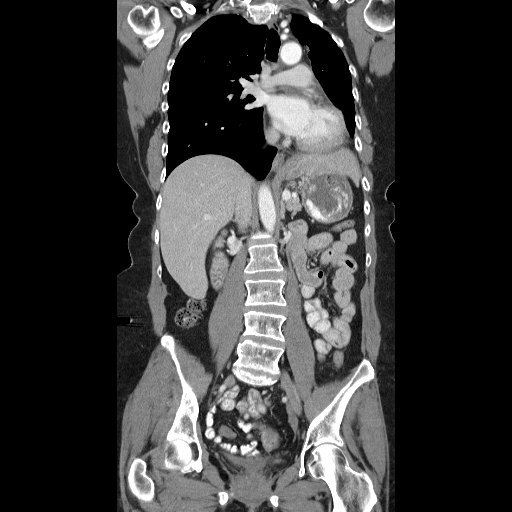

[16 of 46 positions shown; findings below may reference images not displayed]

FINDINGS: No supraclavicular or axillary adenopathy. Heart size is normal.  No pericardial effusion.  There is fluid in the pericardial recesses.  No mediastinal or hilar adenopathy.  The esophagus is grossly normal.  
 Surgical change related to a left lower lobectomy with a significant volume in the left lung.  Rib resections are noted.  I do not see any CT findings worrisome for residual or recurrent tumor, and the pulmonary nodules seen on the prior chest CT have decreased in size.  The 9 mm nodule in the left lung now measures a maximum of 6.2 mm.  The right upper lobe lesion measures a maximum of 7.5 mm.  It now measures a maximum of 4 mm.  The subpleural nodule in the superior segment of the right lower lobe is also smaller, as are other nodules.  No new nodules are seen.  No pleural effusions.  
 No destructive bony changes.
IMPRESSION: 1. Stable postoperative changes involving the left lung with loss of volume and residual pleural thickening, but I do not see any evidence for recurrent or residual tumor.
 2. Interval decrease in size of the bilateral pulmonary nodules suggesting a good response to therapy.
 ABDOMEN CT WITH CONTRAST:
FINDINGS: Stable tiny low attenuation lesion in the liver is likely a benign cyst.  
 No biliary dilatation.  The spleen is normal in size.  The pancreas is unremarkable.  There is mild wall thickening involving the gallbladder, which may be due to mild contraction.  The adrenal glands and kidneys demonstrate no significant findings.  The aorta is normal in caliber. No dissection.  
 The stomach, duodenum, small bowel and colon demonstrate no significant findings.  
 No destructive bony lesions are seen.
IMPRESSION: No acute abdominal findings, mass lesions or adenopathy.  I do not see any evidence for metastatic disease.  
 PELVIS CT WITH CONTRAST:
FINDINGS: The rectum, sigmoid colon and visualized bowel loops are unremarkable.  The bladder is decompressed.  Fibroid uterus is again noted.  The ovaries appear normal.  No pelvic masses or adenopathy.  No inguinal adenopathy.  The bony pelvis is intact.
IMPRESSION: 1. No acute pelvic findings, masses or adenopathy.  
 2. Stable fibroid uterus.

## 2009-09-02 ENCOUNTER — Ambulatory Visit (HOSPITAL_COMMUNITY): Admission: RE | Admit: 2009-09-02 | Discharge: 2009-09-02 | Payer: Self-pay | Admitting: Internal Medicine

## 2009-09-02 LAB — COMPREHENSIVE METABOLIC PANEL
ALT: 24 U/L (ref 0–35)
AST: 22 U/L (ref 0–37)
Albumin: 4.4 g/dL (ref 3.5–5.2)
Alkaline Phosphatase: 76 U/L (ref 39–117)
BUN: 12 mg/dL (ref 6–23)
CO2: 28 mEq/L (ref 19–32)
Calcium: 9.9 mg/dL (ref 8.4–10.5)
Chloride: 107 mEq/L (ref 96–112)
Creatinine, Ser: 0.89 mg/dL (ref 0.40–1.20)
Glucose, Bld: 93 mg/dL (ref 70–99)
Potassium: 3.5 mEq/L (ref 3.5–5.3)
Sodium: 141 mEq/L (ref 135–145)
Total Bilirubin: 1.6 mg/dL — ABNORMAL HIGH (ref 0.3–1.2)
Total Protein: 6.9 g/dL (ref 6.0–8.3)

## 2009-09-02 LAB — CBC WITH DIFFERENTIAL/PLATELET
BASO%: 0.5 % (ref 0.0–2.0)
Basophils Absolute: 0 10*3/uL (ref 0.0–0.1)
EOS%: 6.1 % (ref 0.0–7.0)
Eosinophils Absolute: 0.3 10*3/uL (ref 0.0–0.5)
HCT: 44.8 % (ref 34.8–46.6)
HGB: 14.5 g/dL (ref 11.6–15.9)
LYMPH%: 21.6 % (ref 14.0–49.7)
MCH: 30.2 pg (ref 25.1–34.0)
MCHC: 32.4 g/dL (ref 31.5–36.0)
MCV: 93.3 fL (ref 79.5–101.0)
MONO#: 0.4 10*3/uL (ref 0.1–0.9)
MONO%: 9.2 % (ref 0.0–14.0)
NEUT#: 2.7 10*3/uL (ref 1.5–6.5)
NEUT%: 62.6 % (ref 38.4–76.8)
Platelets: 209 10*3/uL (ref 145–400)
RBC: 4.8 10*6/uL (ref 3.70–5.45)
RDW: 13.4 % (ref 11.2–14.5)
WBC: 4.3 10*3/uL (ref 3.9–10.3)
lymph#: 0.9 10*3/uL (ref 0.9–3.3)

## 2009-11-30 ENCOUNTER — Ambulatory Visit (HOSPITAL_COMMUNITY): Admission: RE | Admit: 2009-11-30 | Discharge: 2009-11-30 | Payer: Self-pay | Admitting: Internal Medicine

## 2009-11-30 ENCOUNTER — Ambulatory Visit: Payer: Self-pay | Admitting: Internal Medicine

## 2009-11-30 LAB — CBC WITH DIFFERENTIAL/PLATELET
BASO%: 0.3 % (ref 0.0–2.0)
Basophils Absolute: 0 10*3/uL (ref 0.0–0.1)
EOS%: 6.1 % (ref 0.0–7.0)
Eosinophils Absolute: 0.2 10*3/uL (ref 0.0–0.5)
HCT: 42.2 % (ref 34.8–46.6)
HGB: 13.8 g/dL (ref 11.6–15.9)
LYMPH%: 26.5 % (ref 14.0–49.7)
MCH: 30.3 pg (ref 25.1–34.0)
MCHC: 32.7 g/dL (ref 31.5–36.0)
MCV: 92.5 fL (ref 79.5–101.0)
MONO#: 0.3 10*3/uL (ref 0.1–0.9)
MONO%: 8.6 % (ref 0.0–14.0)
NEUT#: 2.2 10*3/uL (ref 1.5–6.5)
NEUT%: 58.5 % (ref 38.4–76.8)
Platelets: 198 10*3/uL (ref 145–400)
RBC: 4.56 10*6/uL (ref 3.70–5.45)
RDW: 13.2 % (ref 11.2–14.5)
WBC: 3.7 10*3/uL — ABNORMAL LOW (ref 3.9–10.3)
lymph#: 1 10*3/uL (ref 0.9–3.3)

## 2009-11-30 LAB — COMPREHENSIVE METABOLIC PANEL
ALT: 21 U/L (ref 0–35)
AST: 18 U/L (ref 0–37)
Albumin: 4.2 g/dL (ref 3.5–5.2)
Alkaline Phosphatase: 76 U/L (ref 39–117)
BUN: 10 mg/dL (ref 6–23)
CO2: 26 mEq/L (ref 19–32)
Calcium: 9.3 mg/dL (ref 8.4–10.5)
Chloride: 106 mEq/L (ref 96–112)
Creatinine, Ser: 0.82 mg/dL (ref 0.40–1.20)
Glucose, Bld: 94 mg/dL (ref 70–99)
Potassium: 3.7 mEq/L (ref 3.5–5.3)
Sodium: 139 mEq/L (ref 135–145)
Total Bilirubin: 1.1 mg/dL (ref 0.3–1.2)
Total Protein: 6.9 g/dL (ref 6.0–8.3)

## 2009-12-17 IMAGING — CT CT ABDOMEN W/ CM
2 of 4 series · 16 of 46 positions shown, 18 images · IV contrast (agent unspecified)
Comparison: 08/29/2007

CT CHEST

CLINICAL DATA: Lung cancer.

CT CHEST, ABDOMEN AND PELVIS WITH CONTRAST
TECHNIQUE: Multidetector CT imaging of the chest, abdomen and
pelvis was performed following the standard protocol during bolus
administration of intravenous contrast.
Contrast: 100 ml omni 300

[Series 2: cap 5.0 b40s st · axial · 0.75mm/px · z∈[-635,-35]mm · 13 of 134 slices shown, 15 images]
[im 7/134  soft-tissue]
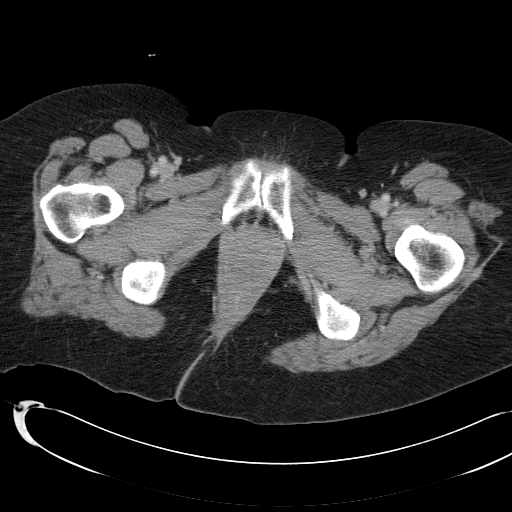
[im 7/134  bone]
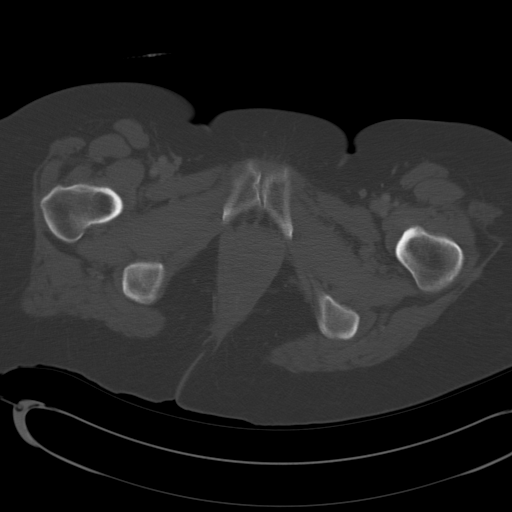
[im 19/134  soft-tissue]
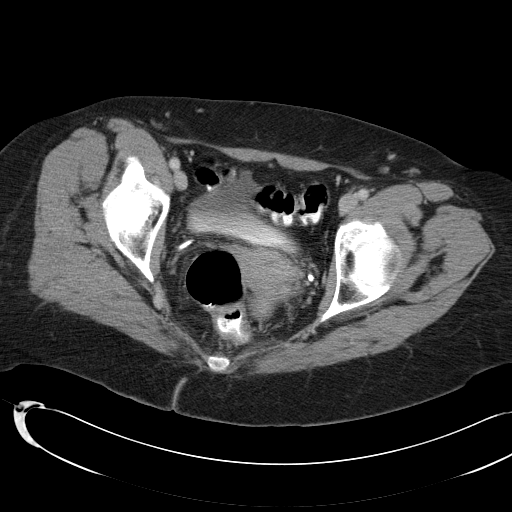
[im 31/134  soft-tissue]
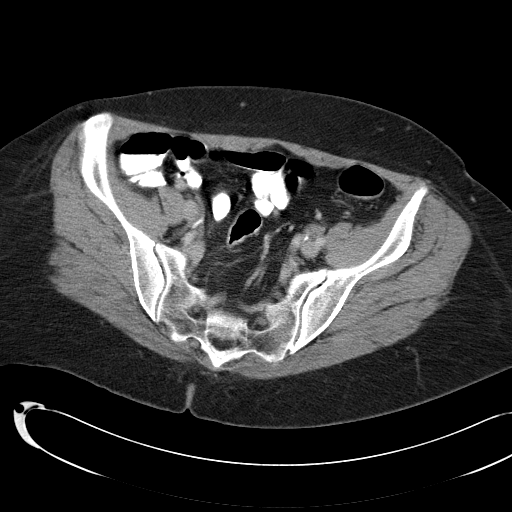
[im 37/134  soft-tissue]
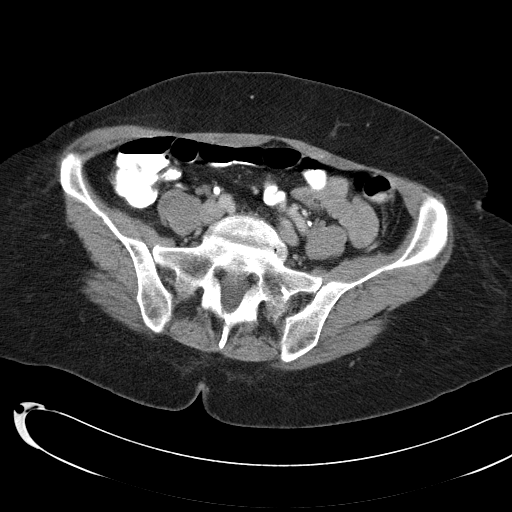
[im 49/134  soft-tissue]
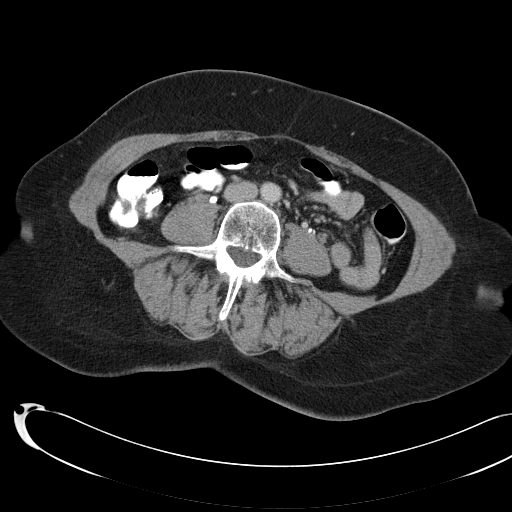
[im 55/134  soft-tissue]
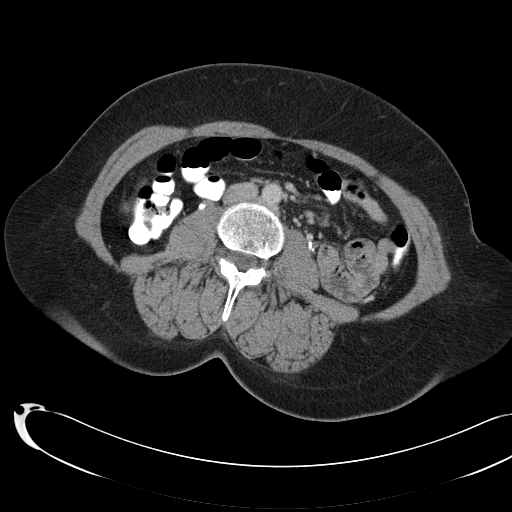
[im 67/134  soft-tissue]
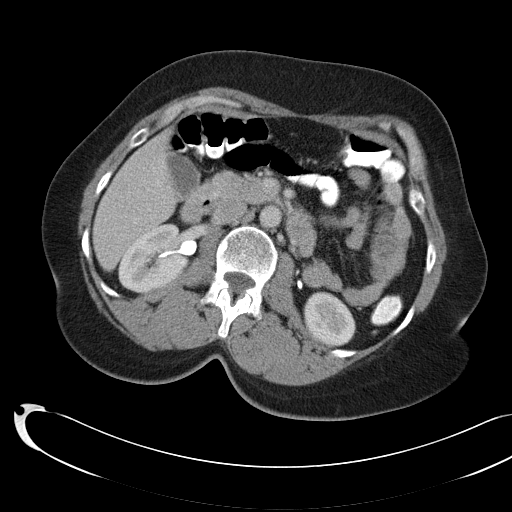
[im 79/134  soft-tissue]
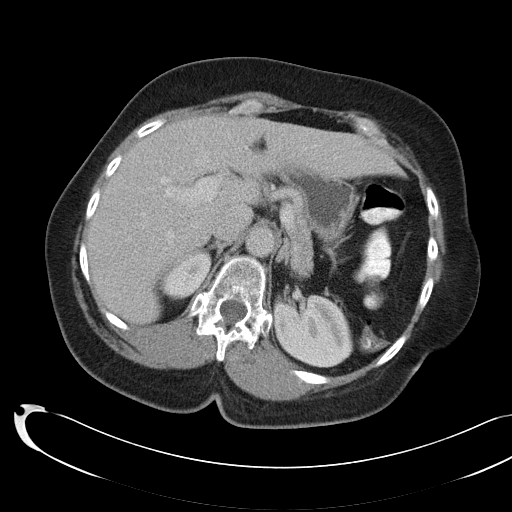
[im 85/134  soft-tissue]
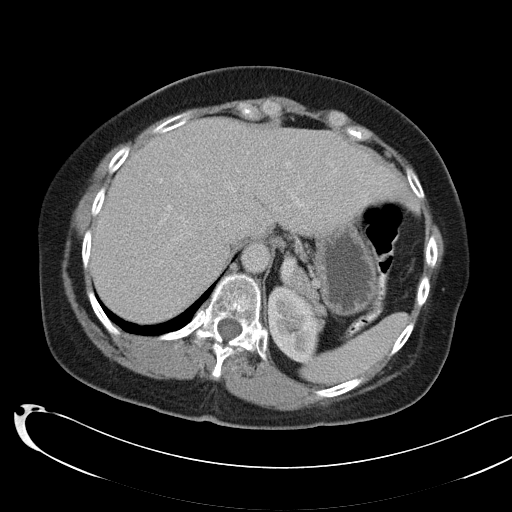
[im 85/134  bone]
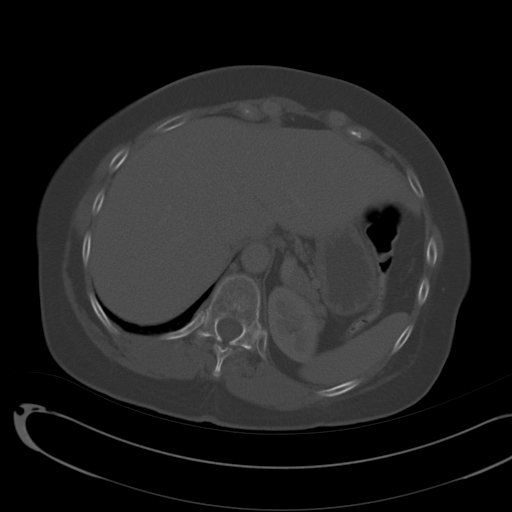
[im 97/134  soft-tissue]
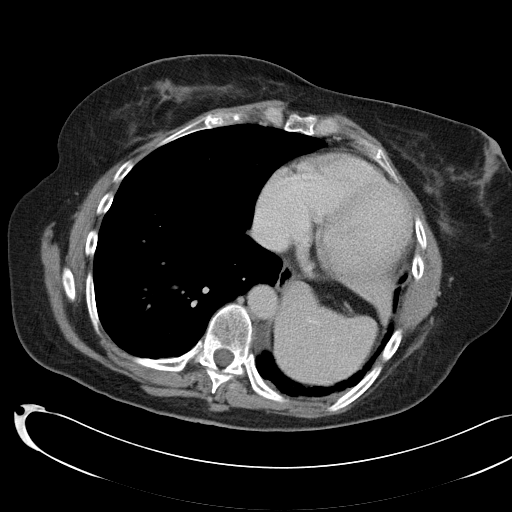
[im 103/134  soft-tissue]
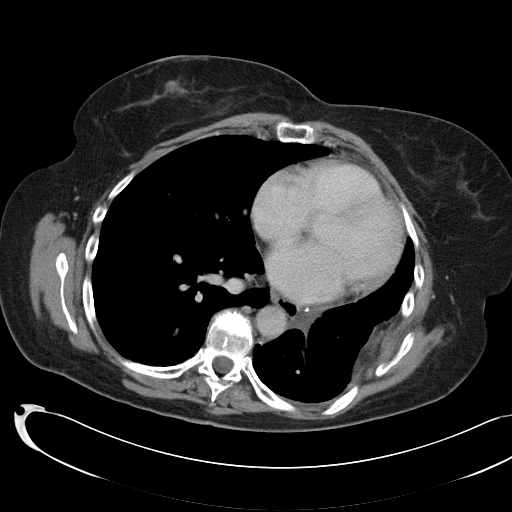
[im 115/134  soft-tissue]
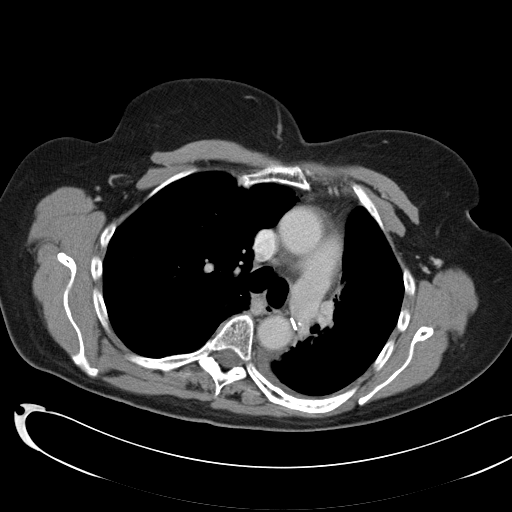
[im 127/134  soft-tissue]
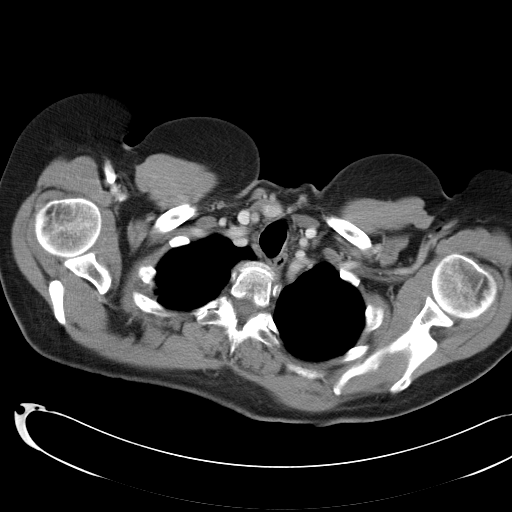

[Series 602: coronal images · coronal · 1.34mm/px · 3 of 95 slices shown]
[im 32/95  soft-tissue]
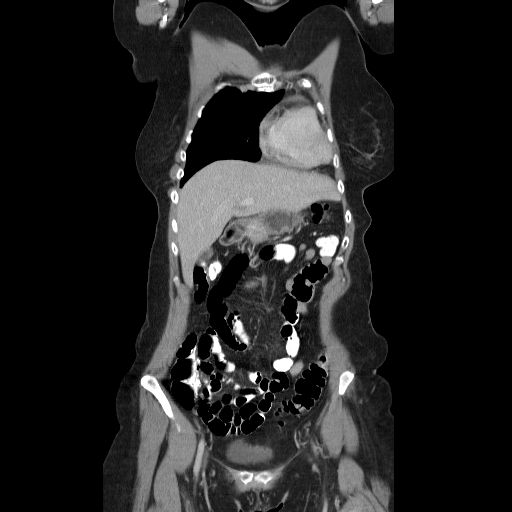
[im 42/95  soft-tissue]
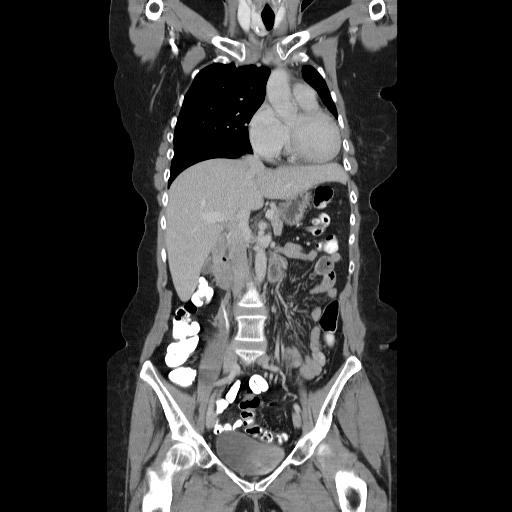
[im 53/95  soft-tissue]
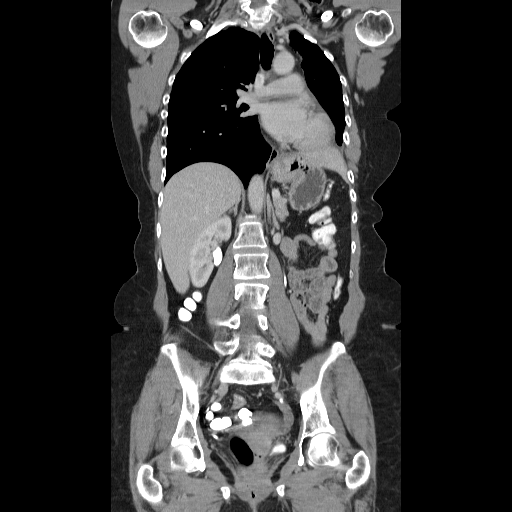

[16 of 46 positions shown; findings below may reference images not displayed]

FINDINGS: No enlarged axillary lymph nodes.

There is no enlarged supraclavicular lymph nodes.

Again noted are thyroid nodules.  These would be better evaluated
with thyroid sonography.

Volume loss involving the left lung is again noted.  There is shift
of the mediastinal structures left word.

There is no enlarged mediastinal, or hilar lymph nodes.

Paramediastinal radiation change within the left lung is similar to
the previous exam.

No specific features of residual or recurrent tumor within the left
hemithorax noted.

Nonspecific nodule within the right upper lobe measures 5.3 mm,
image 20.  This is compared with 4.8 mm previously.

Subpleural nodule within the superior segment of the right lower
lobe measures 3.8 mm, image 27.  This is unchanged from pre
previous exam.

No new or enlarging pulmonary nodules or masses noted.

Review of the visualized osseous structures shows no suspicious
lytic or sclerotic lesions.
IMPRESSION: 1.  Stable postoperative changes and prone or strain deviation
change within the left lung.  No specific features of residual or
current tumor noted.
2.  Stable nonspecific pulmonary nodules within the right lung.

CT ABDOMEN
FINDINGS: The spleen is normal.

The adrenal glands are normal.

Tiny hypodensity within the right hepatic lobe measures 4.5 mm,
image 46.  This is unchanged from previous exam.

The liver parenchyma is otherwise normal in attenuation and
morphology.

The gallbladder is negative.

The pancreas is negative.

Both kidneys are normal.

The spleen is normal.

No enlarged retroperitoneal, or small bowel mesenteric lymph nodes
identified.

There is no ascites.

The appendix is normal

Review of the visualized osseous structures is unremarkable.
IMPRESSION: 1.  No evidence for mass or adenopathy.
2.  Stable tiny hypodensity within right hepatic lobe.

CT PELVIS
FINDINGS: Urinary bladder is negative.

No enlarged lymph nodes are identified within the pelvis.

Fibroid uterus is again noted.

Pelvic bowel loops are unremarkable.

Review of the visualized osseous structures is unremarkable.
IMPRESSION: 1.  Negative for mass or adenopathy.

## 2010-02-02 ENCOUNTER — Ambulatory Visit: Payer: Self-pay | Admitting: Internal Medicine

## 2010-02-06 ENCOUNTER — Ambulatory Visit (HOSPITAL_COMMUNITY): Admission: RE | Admit: 2010-02-06 | Discharge: 2010-02-06 | Payer: Self-pay | Admitting: Internal Medicine

## 2010-02-06 LAB — COMPREHENSIVE METABOLIC PANEL
ALT: 18 U/L (ref 0–35)
AST: 17 U/L (ref 0–37)
Albumin: 4 g/dL (ref 3.5–5.2)
Alkaline Phosphatase: 63 U/L (ref 39–117)
BUN: 13 mg/dL (ref 6–23)
CO2: 28 mEq/L (ref 19–32)
Calcium: 9.2 mg/dL (ref 8.4–10.5)
Chloride: 107 mEq/L (ref 96–112)
Creatinine, Ser: 0.77 mg/dL (ref 0.40–1.20)
Glucose, Bld: 105 mg/dL — ABNORMAL HIGH (ref 70–99)
Potassium: 3.6 mEq/L (ref 3.5–5.3)
Sodium: 140 mEq/L (ref 135–145)
Total Bilirubin: 1.2 mg/dL (ref 0.3–1.2)
Total Protein: 6.5 g/dL (ref 6.0–8.3)

## 2010-02-06 LAB — CBC WITH DIFFERENTIAL/PLATELET
BASO%: 0.4 % (ref 0.0–2.0)
Basophils Absolute: 0 10*3/uL (ref 0.0–0.1)
EOS%: 1.8 % (ref 0.0–7.0)
Eosinophils Absolute: 0.1 10*3/uL (ref 0.0–0.5)
HCT: 38.6 % (ref 34.8–46.6)
HGB: 13.1 g/dL (ref 11.6–15.9)
LYMPH%: 19.2 % (ref 14.0–49.7)
MCH: 31 pg (ref 25.1–34.0)
MCHC: 34 g/dL (ref 31.5–36.0)
MCV: 91.4 fL (ref 79.5–101.0)
MONO#: 0.3 10*3/uL (ref 0.1–0.9)
MONO%: 9.2 % (ref 0.0–14.0)
NEUT#: 2.4 10*3/uL (ref 1.5–6.5)
NEUT%: 69.4 % (ref 38.4–76.8)
Platelets: 231 10*3/uL (ref 145–400)
RBC: 4.23 10*6/uL (ref 3.70–5.45)
RDW: 13.9 % (ref 11.2–14.5)
WBC: 3.5 10*3/uL — ABNORMAL LOW (ref 3.9–10.3)
lymph#: 0.7 10*3/uL — ABNORMAL LOW (ref 0.9–3.3)

## 2010-02-26 IMAGING — CT CT PELVIS W/ CM
1 of 3 series · 13 of 32 positions shown, 18 images · IV contrast (agent unspecified)
Comparison: CT scans 12/22/2007, 08/29/2007 and 05/26/2007.

CT CHEST

CLINICAL DATA: Lung cancer with known brain metastases.

CT CHEST, ABDOMEN AND PELVIS WITH CONTRAST
TECHNIQUE: Multidetector CT imaging of the chest, abdomen and
pelvis was performed following the standard protocol during bolus
administration of intravenous contrast.
Contrast: 100 ml Cmnipaque-L11

[Series 2: cap 5.0 b40f st · axial · 0.70mm/px · z∈[-606,-36]mm · 13 of 130 slices shown, 18 images]
[im 8/130  soft-tissue]
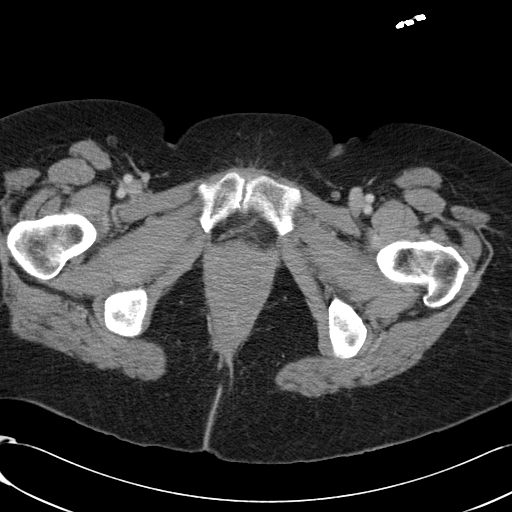
[im 8/130  bone]
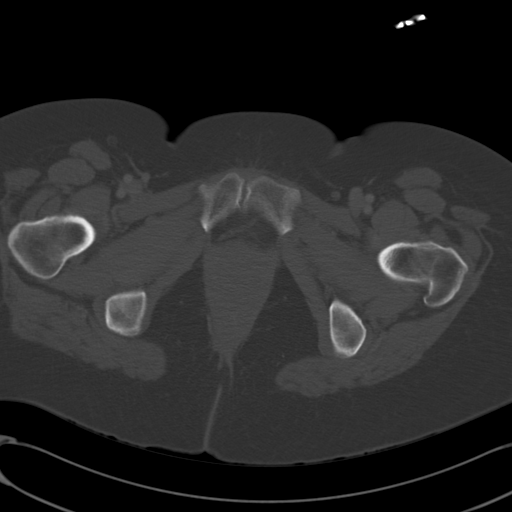
[im 22/130  soft-tissue]
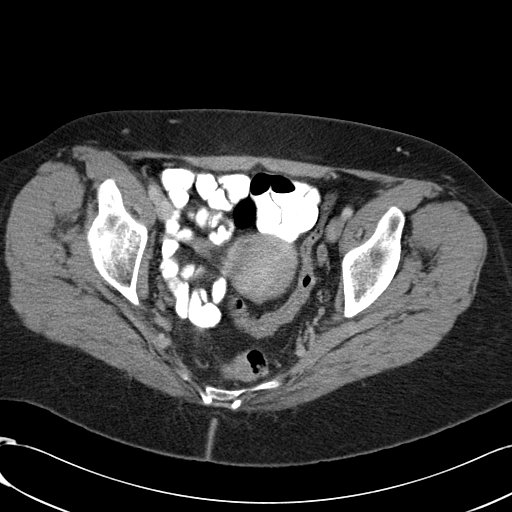
[im 29/130  soft-tissue]
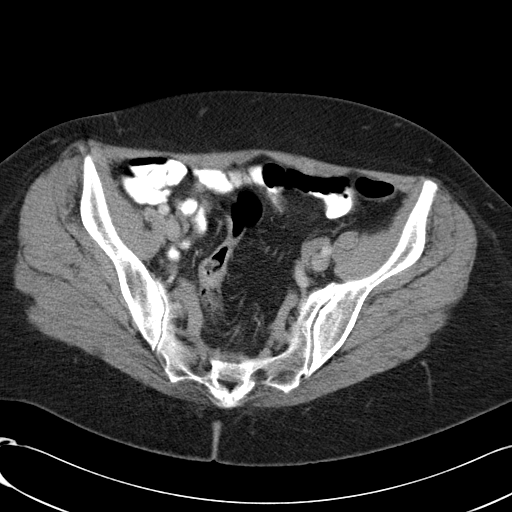
[im 36/130  soft-tissue]
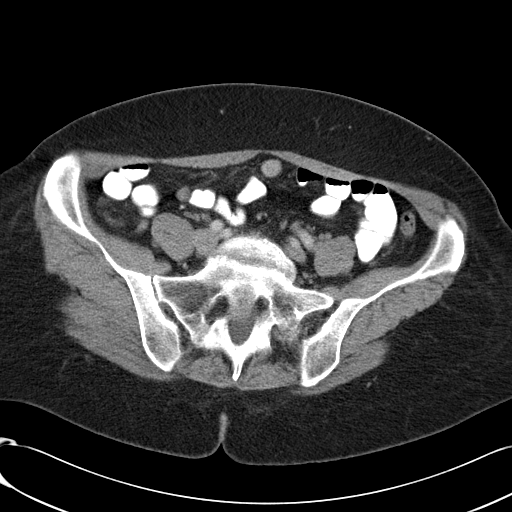
[im 51/130  soft-tissue]
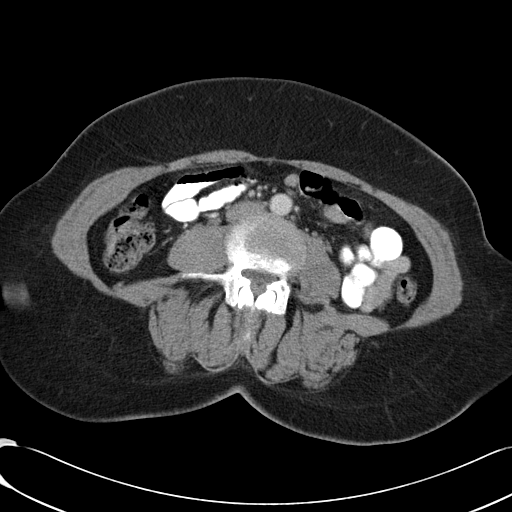
[im 58/130  soft-tissue]
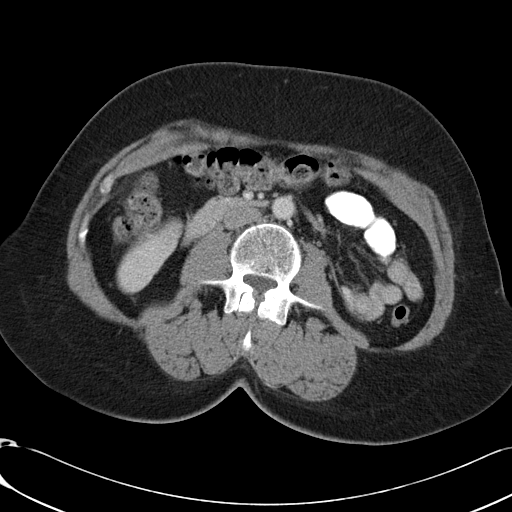
[im 72/130  soft-tissue]
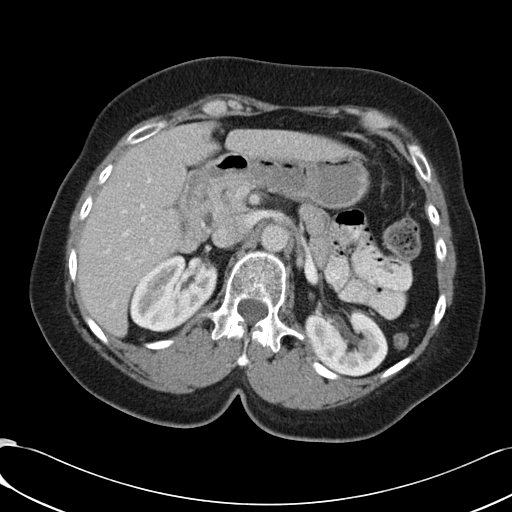
[im 79/130  soft-tissue]
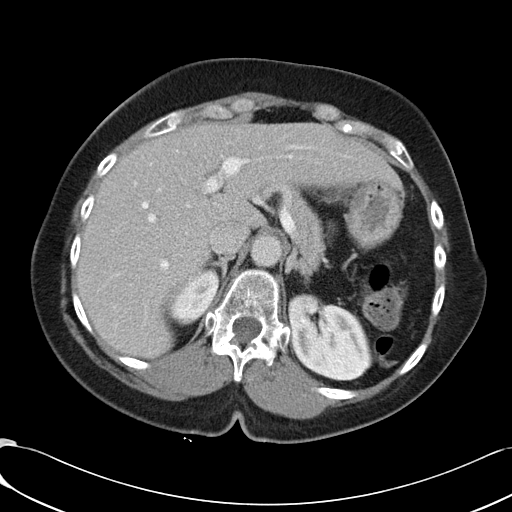
[im 94/130  soft-tissue]
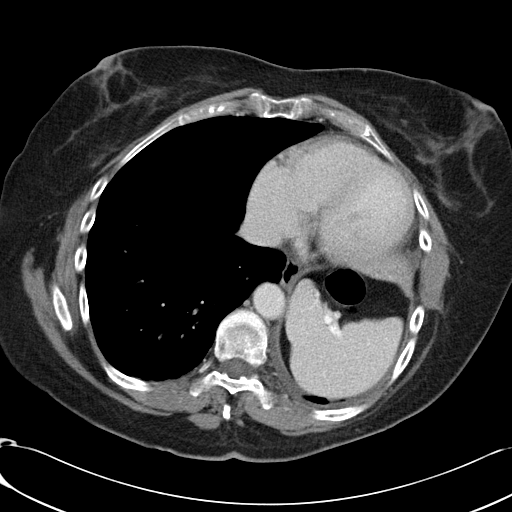
[im 94/130  bone]
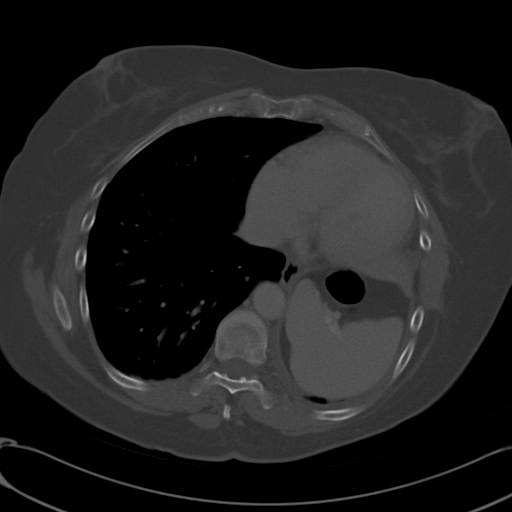
[im 101/130  soft-tissue]
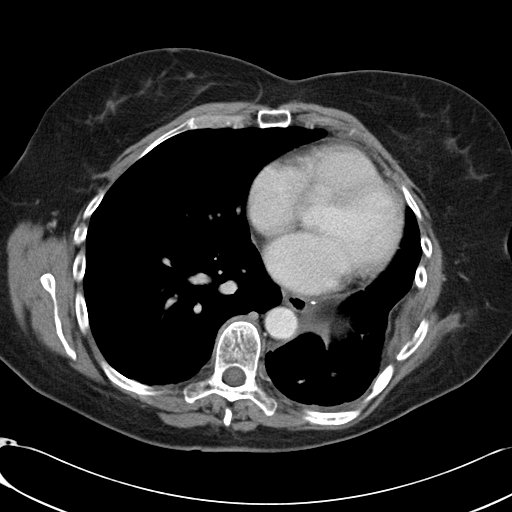
[im 101/130  lung]
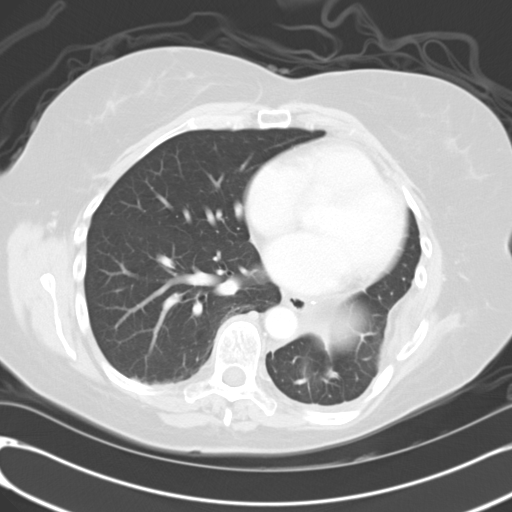
[im 108/130  soft-tissue]
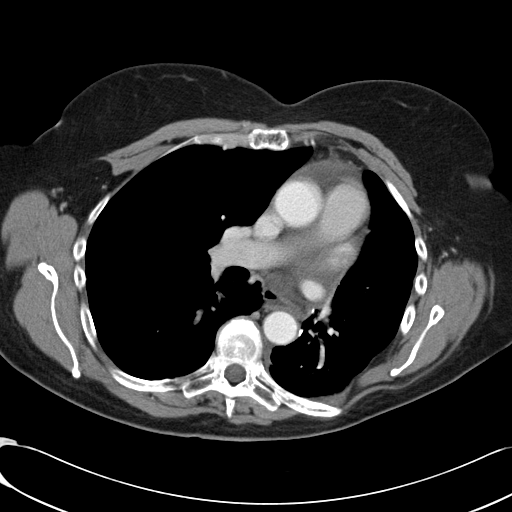
[im 108/130  lung]
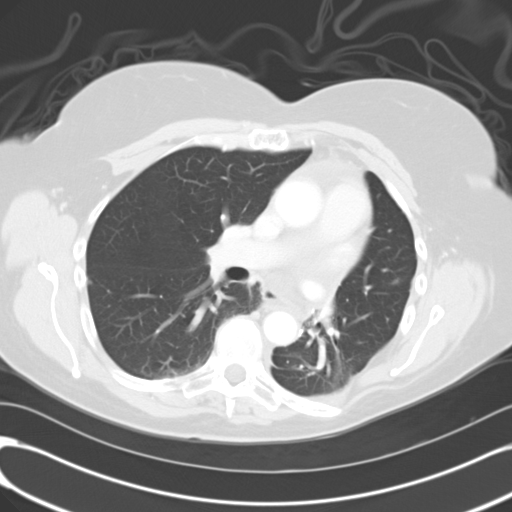
[im 115/130  lung]
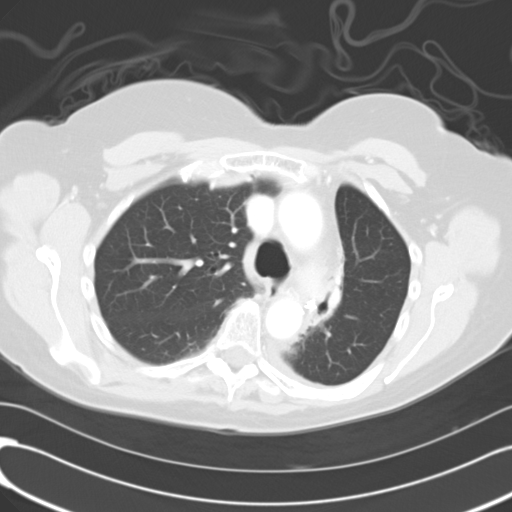
[im 122/130  soft-tissue]
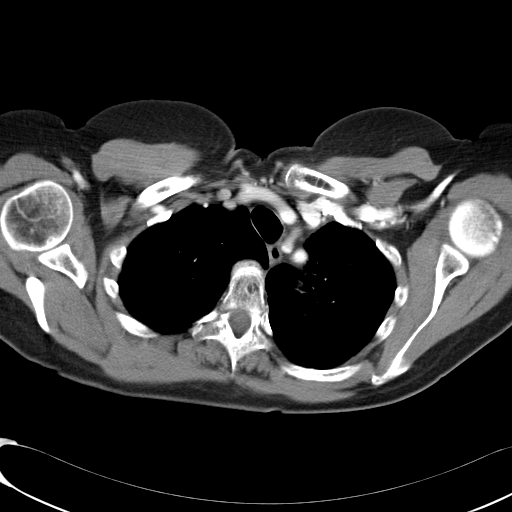
[im 122/130  lung]
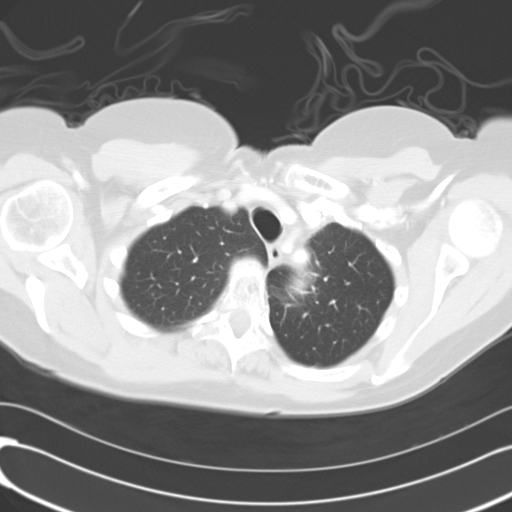

[13 of 32 positions shown; findings below may reference images not displayed]

FINDINGS: Again seen is volume loss in the left chest with clips
in the left hilum consistent with postoperative change of left
thoracotomy and lobectomy.  Small amount of left pleural fluid is
unchanged.  No axillary, mediastinal or hilar lymphadenopathy is
identified.  Heart size is normal.  Atherosclerotic vascular
disease again noted.

Lungs again demonstrate post-treatment change from radiation
therapy on the left, unchanged.  Right upper lobe nodule seen on
the prior study is unchanged at 0.5 cm on image 19.  Tiny
subpleural nodule in the right lower lobe described on the prior
study is again visualized on image 23 and is unchanged in size at
approximate 0.4 cm.  0.6 cm left lower lobe pulmonary nodule
described on the 08/29/2007 study is not visualized today.  No new
nodule or mass identified.  No evidence of bony metastatic disease.
IMPRESSION: 1.  Stable CT examination of the chest.  No evidence of residual or
recurrent disease.

CT ABDOMEN
FINDINGS: The liver, gallbladder, biliary tree, adrenal glands,
spleen, pancreas and kidneys all appear normal.  Previously seen
0.5 cm hypoattenuating focus in the right lobe the liver is not
visualized on today's examination possibly due to slice selection.
There is no abdominal lymphadenopathy or fluid collection.  Stomach
and small bowel appear normal.  No focal bony abnormality.
IMPRESSION: 1.  Negative for metastatic disease the abdomen.  No acute finding.

CT PELVIS
FINDINGS: There is no pelvic lymphadenopathy or fluid collection.
Fibroid uterus is again noted and unchanged appearance.  The adnexa
are unremarkable.  The colon and appendix appear normal.  There is
no focal bony abnormality.
IMPRESSION: Negative for metastatic disease in the pelvis.  Fibroid uterus
again noted.

## 2010-04-11 ENCOUNTER — Encounter: Admission: RE | Admit: 2010-04-11 | Discharge: 2010-04-11 | Payer: Self-pay | Admitting: Obstetrics and Gynecology

## 2010-05-26 ENCOUNTER — Ambulatory Visit: Payer: Self-pay | Admitting: Internal Medicine

## 2010-05-27 IMAGING — CT CT ABDOMEN W/ CM
2 of 5 series · 17 of 46 positions shown, 19 images · IV contrast (agent unspecified)
Comparison: 03/02/2008

CT CHEST

CLINICAL DATA: lung cancer

CT CHEST, ABDOMEN AND PELVIS WITH CONTRAST
TECHNIQUE: Multidetector CT imaging of the chest, abdomen and
pelvis was performed following the standard protocol during bolus
administration of intravenous contrast.
Contrast:
100 ml of omni 300

[Series 2: cap 5.0 b40f · axial · 0.69mm/px · z∈[-631,-71]mm · 14 of 130 slices shown, 16 images]
[im 9/130  soft-tissue]
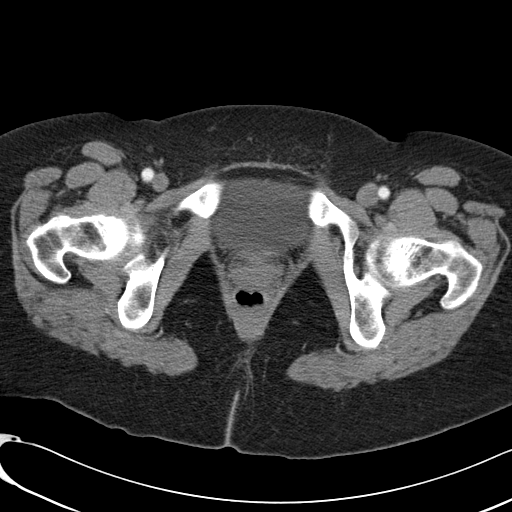
[im 9/130  bone]
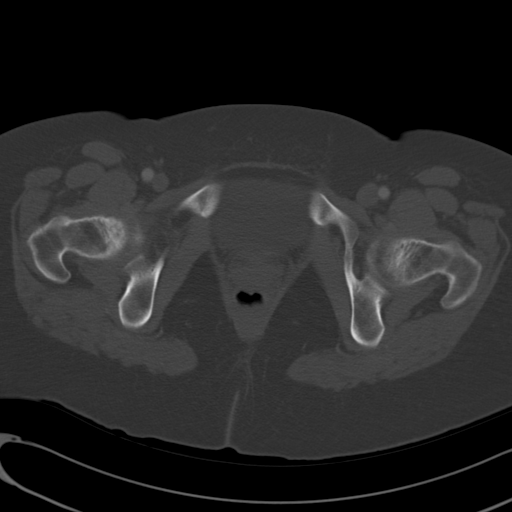
[im 17/130  soft-tissue]
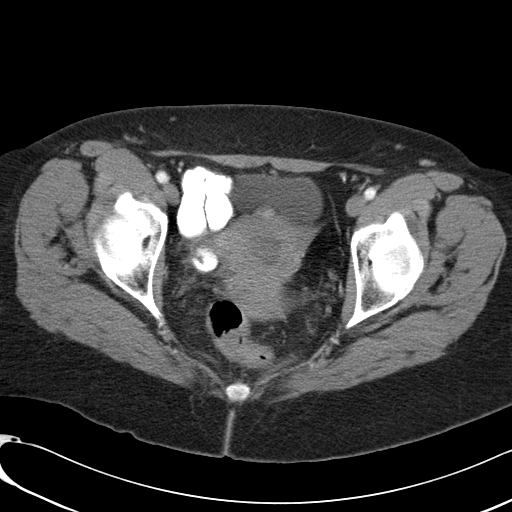
[im 25/130  soft-tissue]
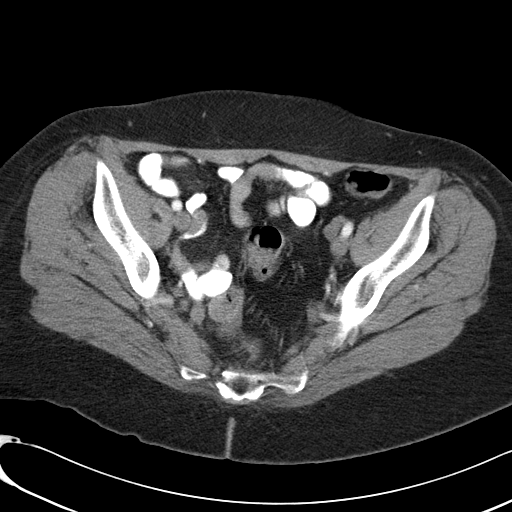
[im 33/130  soft-tissue]
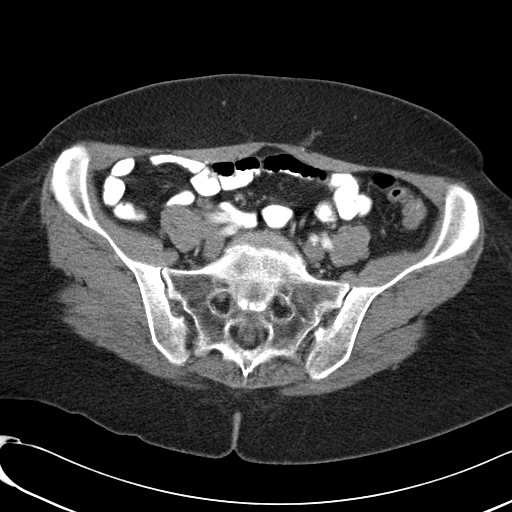
[im 41/130  soft-tissue]
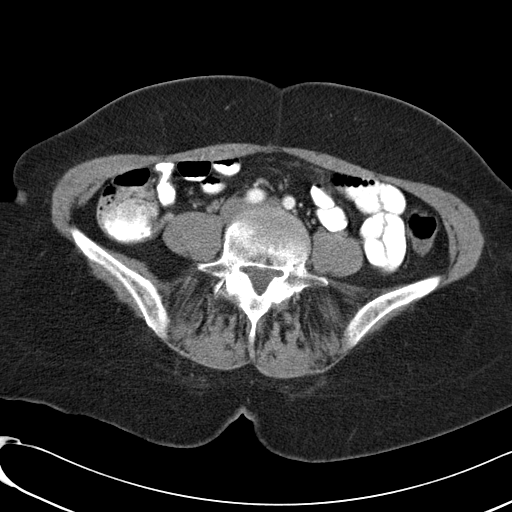
[im 49/130  soft-tissue]
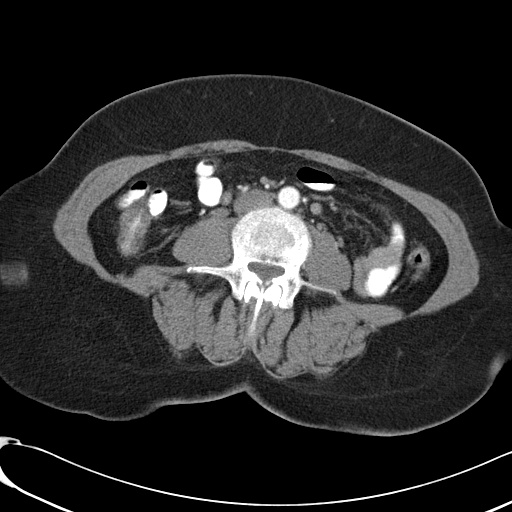
[im 57/130  soft-tissue]
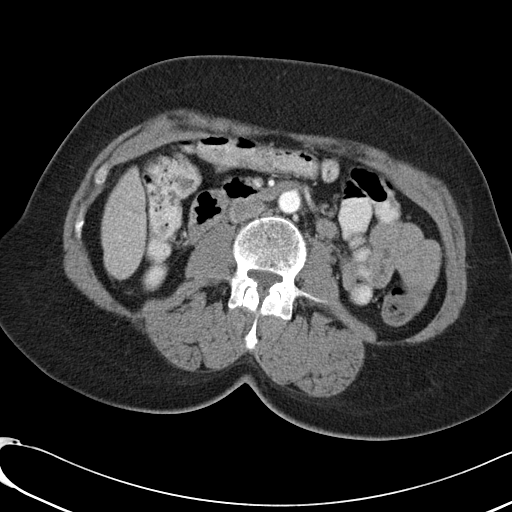
[im 73/130  soft-tissue]
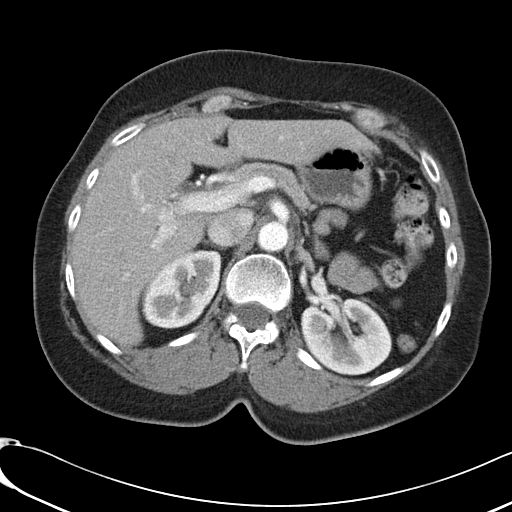
[im 81/130  soft-tissue]
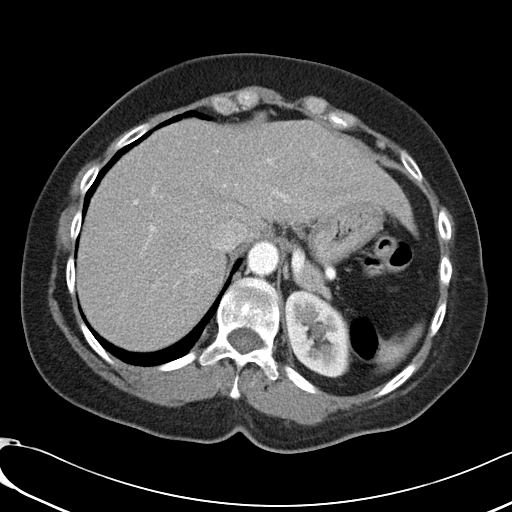
[im 81/130  bone]
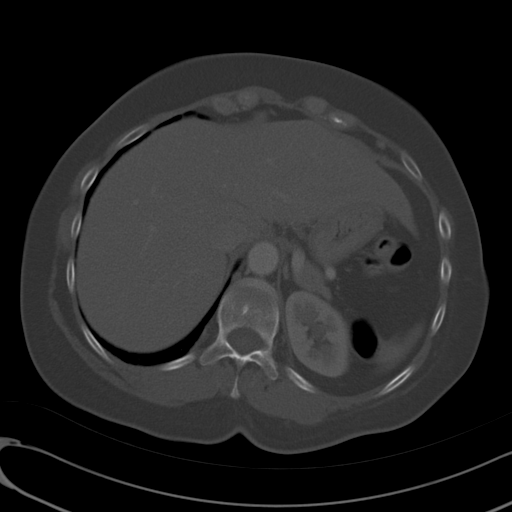
[im 89/130  soft-tissue]
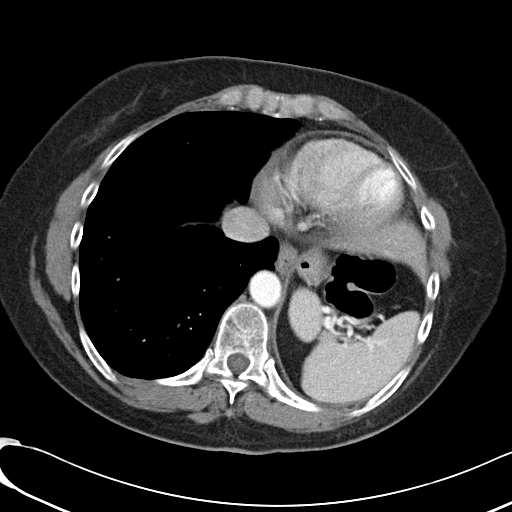
[im 97/130  soft-tissue]
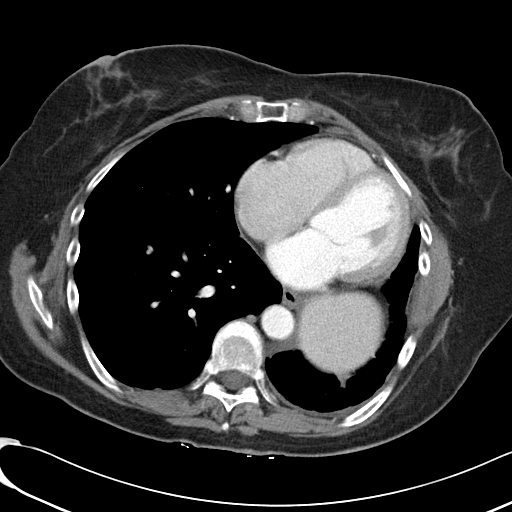
[im 105/130  soft-tissue]
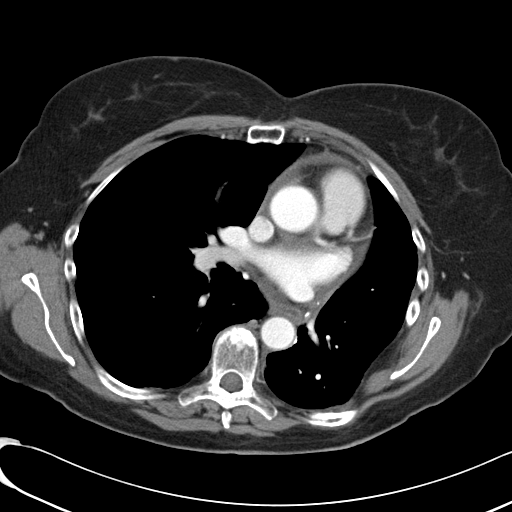
[im 113/130  soft-tissue]
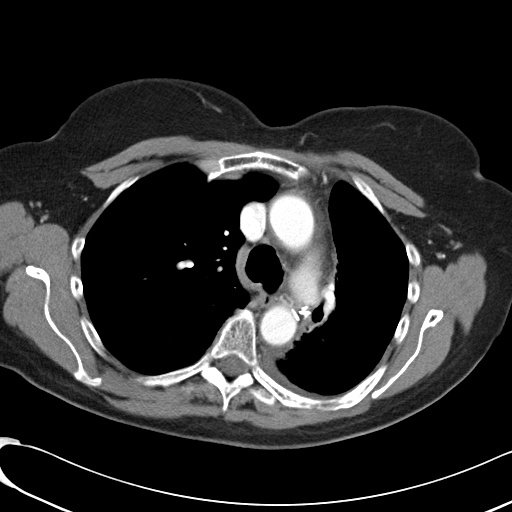
[im 121/130  soft-tissue]
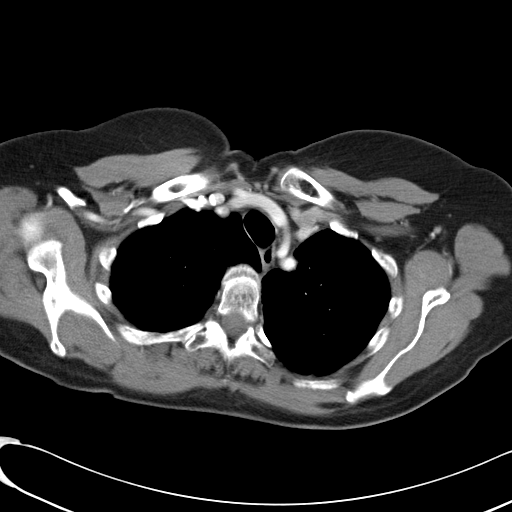

[Series 602: <mpr thick range> · coronal · 1.26mm/px · 3 of 91 slices shown]
[im 31/91  soft-tissue]
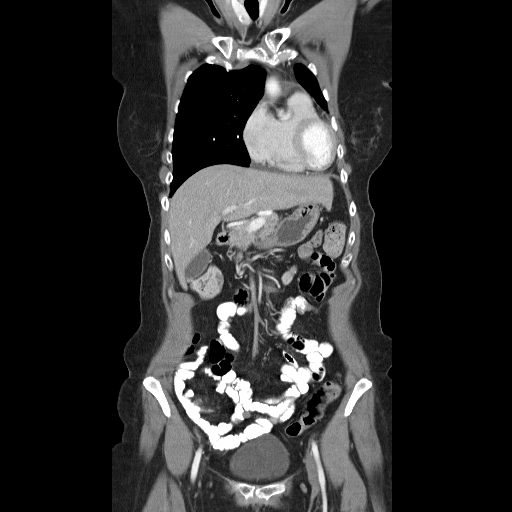
[im 41/91  soft-tissue]
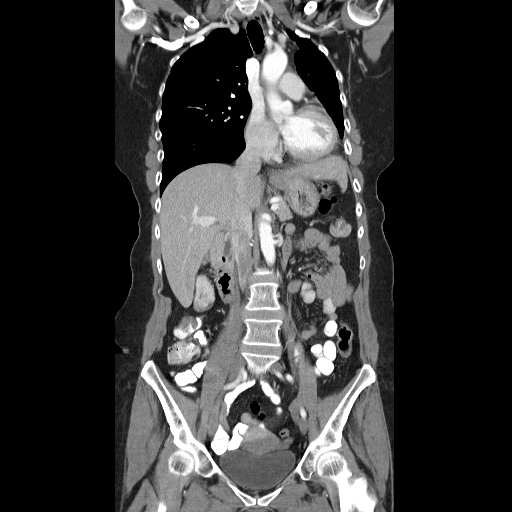
[im 51/91  soft-tissue]
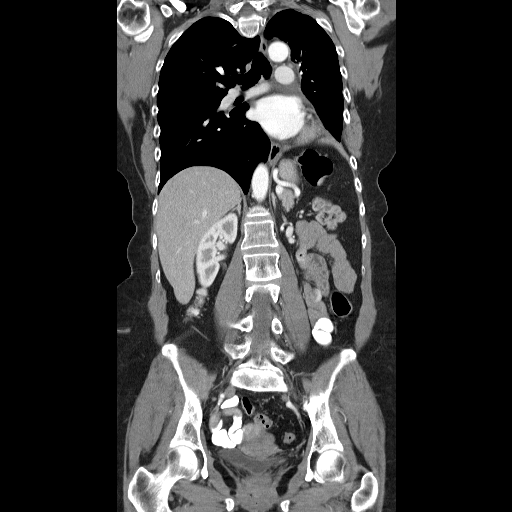

[17 of 46 positions shown; findings below may reference images not displayed]

FINDINGS: No enlarged axillary or supraclavicular lymph nodes.

There is no enlarged mediastinal or hilar lymph nodes.

A small left pleural effusion is present.

Pulmonary nodule within the right upper lobe measures 4.1 mm, image
20.  This is unchanged from the previous study.

3 mm nodule within the right lower lobe, image 26 is also stable.

No new or enlarging pulmonary nodules or masses noted.

Review of the visualized osseous structures shows no evidence for
metastatic disease.
IMPRESSION: 1.  Stable CT of the chest.  No evidence for residual or recurrent
tumor.

CT ABDOMEN
FINDINGS: The liver is of normal.

The spleen is normal.

The right kidney is normal.

The left kidney is normal.

Both of the adrenal glands are unremarkable.

The pancreas is normal.

Gallbladder is negative.

There are no enlarged retroperitoneal or small bowel mesenteric
lymph nodes.

The bowel loops of the upper abdomen are normal in their course and
caliber.

Review of the visualized osseous structures is unremarkable
IMPRESSION: 1.  Negative for mass or adenopathy.

CT PELVIS
FINDINGS: There is no free fluid.

No abnormal fluid collections are noted.

No enlarged lymph nodes are identified within the pelvis.

The urinary bladder appears normal.

There is a large fundal fibroid arising from the uterus which
measures 3.9 cm.
IMPRESSION: 1.  Stable CT of the pelvis without evidence for metastatic
disease.

## 2010-05-30 LAB — CBC WITH DIFFERENTIAL/PLATELET
BASO%: 0.3 % (ref 0.0–2.0)
Basophils Absolute: 0 10*3/uL (ref 0.0–0.1)
EOS%: 0.9 % (ref 0.0–7.0)
Eosinophils Absolute: 0 10*3/uL (ref 0.0–0.5)
HCT: 40.9 % (ref 34.8–46.6)
HGB: 13.9 g/dL (ref 11.6–15.9)
LYMPH%: 16.9 % (ref 14.0–49.7)
MCH: 31.2 pg (ref 25.1–34.0)
MCHC: 34.1 g/dL (ref 31.5–36.0)
MCV: 91.6 fL (ref 79.5–101.0)
MONO#: 0.3 10*3/uL (ref 0.1–0.9)
MONO%: 7.2 % (ref 0.0–14.0)
NEUT#: 3.3 10*3/uL (ref 1.5–6.5)
NEUT%: 74.7 % (ref 38.4–76.8)
Platelets: 228 10*3/uL (ref 145–400)
RBC: 4.46 10*6/uL (ref 3.70–5.45)
RDW: 13.5 % (ref 11.2–14.5)
WBC: 4.4 10*3/uL (ref 3.9–10.3)
lymph#: 0.7 10*3/uL — ABNORMAL LOW (ref 0.9–3.3)

## 2010-05-30 LAB — COMPREHENSIVE METABOLIC PANEL
ALT: 19 U/L (ref 0–35)
AST: 20 U/L (ref 0–37)
Albumin: 4.5 g/dL (ref 3.5–5.2)
Alkaline Phosphatase: 73 U/L (ref 39–117)
BUN: 11 mg/dL (ref 6–23)
CO2: 29 mEq/L (ref 19–32)
Calcium: 9.8 mg/dL (ref 8.4–10.5)
Chloride: 105 mEq/L (ref 96–112)
Creatinine, Ser: 0.87 mg/dL (ref 0.40–1.20)
Glucose, Bld: 107 mg/dL — ABNORMAL HIGH (ref 70–99)
Potassium: 3.8 mEq/L (ref 3.5–5.3)
Sodium: 141 mEq/L (ref 135–145)
Total Bilirubin: 1.6 mg/dL — ABNORMAL HIGH (ref 0.3–1.2)
Total Protein: 7.2 g/dL (ref 6.0–8.3)

## 2010-05-31 ENCOUNTER — Ambulatory Visit (HOSPITAL_COMMUNITY): Admission: RE | Admit: 2010-05-31 | Discharge: 2010-05-31 | Payer: Self-pay | Admitting: Internal Medicine

## 2010-06-13 ENCOUNTER — Ambulatory Visit (HOSPITAL_COMMUNITY): Admission: RE | Admit: 2010-06-13 | Discharge: 2010-06-13 | Payer: Self-pay | Admitting: Internal Medicine

## 2010-06-13 LAB — COMPREHENSIVE METABOLIC PANEL
ALT: 13 U/L (ref 0–35)
AST: 13 U/L (ref 0–37)
Albumin: 4.5 g/dL (ref 3.5–5.2)
Alkaline Phosphatase: 77 U/L (ref 39–117)
BUN: 11 mg/dL (ref 6–23)
CO2: 27 mEq/L (ref 19–32)
Calcium: 9.8 mg/dL (ref 8.4–10.5)
Chloride: 105 mEq/L (ref 96–112)
Creatinine, Ser: 0.81 mg/dL (ref 0.40–1.20)
Glucose, Bld: 70 mg/dL (ref 70–99)
Potassium: 3.5 mEq/L (ref 3.5–5.3)
Sodium: 141 mEq/L (ref 135–145)
Total Bilirubin: 1.3 mg/dL — ABNORMAL HIGH (ref 0.3–1.2)
Total Protein: 6.7 g/dL (ref 6.0–8.3)

## 2010-08-25 ENCOUNTER — Ambulatory Visit: Payer: Self-pay | Admitting: Internal Medicine

## 2010-09-06 LAB — CBC WITH DIFFERENTIAL/PLATELET
BASO%: 0.2 % (ref 0.0–2.0)
Basophils Absolute: 0 10*3/uL (ref 0.0–0.1)
EOS%: 2.3 % (ref 0.0–7.0)
Eosinophils Absolute: 0.1 10*3/uL (ref 0.0–0.5)
HCT: 41.2 % (ref 34.8–46.6)
HGB: 13.9 g/dL (ref 11.6–15.9)
LYMPH%: 20.9 % (ref 14.0–49.7)
MCH: 31 pg (ref 25.1–34.0)
MCHC: 33.8 g/dL (ref 31.5–36.0)
MCV: 91.7 fL (ref 79.5–101.0)
MONO#: 0.3 10*3/uL (ref 0.1–0.9)
MONO%: 8.5 % (ref 0.0–14.0)
NEUT#: 2.4 10*3/uL (ref 1.5–6.5)
NEUT%: 68.1 % (ref 38.4–76.8)
Platelets: 207 10*3/uL (ref 145–400)
RBC: 4.49 10*6/uL (ref 3.70–5.45)
RDW: 14 % (ref 11.2–14.5)
WBC: 3.5 10*3/uL — ABNORMAL LOW (ref 3.9–10.3)
lymph#: 0.7 10*3/uL — ABNORMAL LOW (ref 0.9–3.3)

## 2010-09-06 LAB — COMPREHENSIVE METABOLIC PANEL
ALT: 11 U/L (ref 0–35)
AST: 14 U/L (ref 0–37)
Albumin: 4.8 g/dL (ref 3.5–5.2)
Alkaline Phosphatase: 80 U/L (ref 39–117)
BUN: 11 mg/dL (ref 6–23)
CO2: 26 mEq/L (ref 19–32)
Calcium: 10.3 mg/dL (ref 8.4–10.5)
Chloride: 107 mEq/L (ref 96–112)
Creatinine, Ser: 1 mg/dL (ref 0.40–1.20)
Glucose, Bld: 82 mg/dL (ref 70–99)
Potassium: 3.4 mEq/L — ABNORMAL LOW (ref 3.5–5.3)
Sodium: 144 mEq/L (ref 135–145)
Total Bilirubin: 1.4 mg/dL — ABNORMAL HIGH (ref 0.3–1.2)
Total Protein: 7 g/dL (ref 6.0–8.3)

## 2010-09-08 ENCOUNTER — Other Ambulatory Visit: Payer: Self-pay | Admitting: Internal Medicine

## 2010-09-08 DIAGNOSIS — C3490 Malignant neoplasm of unspecified part of unspecified bronchus or lung: Secondary | ICD-10-CM

## 2010-09-09 ENCOUNTER — Encounter: Payer: Self-pay | Admitting: Internal Medicine

## 2010-09-10 ENCOUNTER — Encounter: Payer: Self-pay | Admitting: Internal Medicine

## 2010-09-19 NOTE — Assessment & Plan Note (Signed)
Summary: screening colon, brain and lung cancer cancer/lk   History of Present Illness Visit Type: Initial Consult Primary GI MD: Sheryn Bison MD FACP FAGA Primary Provider: Dierdre Forth, MD Requesting Provider: Dierdre Forth, MD Chief Complaint: screening colonoscopy pt. has brain and lung ca. History of Present Illness:   This 65 year old African American female is referred for possible repeat colonoscopy that was done in 2006 and was negative except for diverticulosis. 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 She is satisfied that her diarrhea is controlled with Imodium although she does have some fecal incontinency.She Denies abdominal pain, upper GI or hepatobiliary complaints. She denies any specific food intolerances or lactose intolerance. She is in remission in terms of her metastatic lung CA with recently negative CT scans of the abdomen and pelvis in July this year. She is followed by Dr. Marga Melnick, Dr. Dierdre Forth in neurosurgery. He's had previous repair of a fistula in ano several years ago by Dr. Leonie Man. She denies rectal pain or rectal bleeding at this time.   GI Review of Systems      Denies abdominal pain, acid reflux, belching, bloating, chest pain, dysphagia with liquids, dysphagia with solids, heartburn, loss of appetite, nausea, vomiting, vomiting blood, weight loss, and  weight gain.      Reports diarrhea and  fecal incontinence.     Denies anal fissure, black tarry stools, change in bowel habit, constipation, diverticulosis, heme positive stool, hemorrhoids, irritable bowel syndrome, jaundice, light color stool, liver problems, rectal bleeding, and  rectal pain.    Current Medications (verified): 1)  Tarceva 150 Mg Tabs (Erlotinib) .[name].. 1 Tablet By Mouth Once Daily  Allergies (verified): No Known Drug Allergies  Past  History:  Past medical, surgical, family and social histories (including risk factors) reviewed for relevance to current acute and chronic problems.  Past Medical History: Reviewed history from 04/22/2009 and no changes required. 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 repair of Anal fissure  Family History: Reviewed history and no changes required. No FH of Colon Cancer:  Social History: Reviewed history and no changes required. Married 2 daughters Retired Runner, broadcasting/film/video Patient has never smoked.  Alcohol Use - no Illicit Drug Use - no Daily Caffeine Use   1 per day Smoking Status:  never Drug Use:  no  Review of Systems       The patient complains of back pain.  The patient denies allergy/sinus, anemia, anxiety-new, arthritis/joint pain, blood in urine, breast changes/lumps, change in vision, confusion, cough, coughing up blood, depression-new, fainting, fatigue, fever, headaches-new, hearing problems, heart murmur, heart rhythm changes, itching, muscle pains/cramps, night sweats, nosebleeds, shortness of breath, skin rash, sleeping problems, sore throat, swelling of feet/legs, swollen lymph glands, thirst - excessive, urination - excessive, urination changes/pain, urine leakage, vision changes, and voice change.    Vital Signs:  Patient profile:   65 year old female Height:      69 inches Weight:      212.38 pounds BMI:     31.48 BSA:     2.12 Pulse rate:   100 / minute Pulse rhythm:   regular BP sitting:   112 / 68  (left arm)  Vitals Entered By: Milford Cage  NCMA (April 26, 2009 10:12 AM)  Physical Exam  General:  Well developed, well nourished, no acute distress.healthy appearing.   Head:  Normocephalic and atraumatic. Eyes:  PERRLA,  no icterus.exam deferred to patient's ophthalmologist.   Lungs:  Clear throughout to auscultation. Heart:  Regular rate and rhythm; no murmurs, rubs,  or bruits. Abdomen:  Soft, nontender and nondistended. No masses, hepatosplenomegaly or hernias noted. Normal bowel sounds. Rectal:  Normal exam.hemocult negative.  There is a cystic lesion on her right buttocks which is nontender, not erythematous, and I cannot appreciate any definite fistula. Extremities:  No clubbing, cyanosis, edema or deformities noted. Neurologic:  Alert and  oriented x4;  grossly normal neurologically. Psych:  Alert and cooperative. Normal mood and affect.   Impression & Recommendations:  Problem # 1:  DIARRHEA (ICD-787.91) Assessment Unchanged She Does not desire further workup of her diarrhea and colonoscopy for colon cancer screening is not indicated. As per above, her stool today is guaiac negative and has a soft pasty consistency. She should continue use Imodium as needed and I have offered her a trial of Questran 4 g a day with juice but she refused. I did review the PDR information on Tarceva and bad diarrhea is a recognized side effect. There certainly there is no need for colonoscopy except for diarrhea workup she does not want.  Problem # 2:  LESION, ANUS (ICD-569.49) Assessment: Improved  Problem # 3:  ADENOCARCINOMA, LUNG (ICD-162.9) Assessment: Improved Continue followup and medications per oncology Dr. Gwenyth Bouillon and she apparently received her radiation therapy at [facility].    Patient Instructions: 1)  Copy sent to : Dr. Marga Melnick Dr. Dierdre Forth and Dr. Shirline Frees in oncology 2)  Please continue current medications.

## 2010-09-19 NOTE — Procedures (Signed)
Summary: Colonoscopy   Colonoscopy  Procedure date:  04/25/2005  Findings:      Location:  [facility].    Colonoscopy  Procedure date:  04/25/2005  Findings:      Location:  [facility].   Patient Name: Valerie [name], Valerie [name] MRN:  Procedure Procedures: Colonoscopy CPT: [phone].  Personnel: Endoscopist: Vania Rea. Jarold Motto, MD.  Referred By: Titus Dubin. Alwyn Ren, MD.  Exam Location: Exam performed in Outpatient Clinic. Outpatient  Patient Consent: Procedure, Alternatives, Risks and Benefits discussed, consent obtained, from patient. Consent was obtained by the RN.  Indications  Average Risk Screening Routine.  Comments: Hx. of lung CA and chemoradiation and resection. History  Current Medications: Patient is not currently taking Coumadin.  Pre-Exam Physical: Performed Apr 25, 2005. Cardio-pulmonary exam, Rectal exam, Abdominal exam, Extremity exam, Mental status exam WNL.  Exam Exam: Extent of exam reached: Cecum, extent intended: Cecum.  The cecum was identified by appendiceal orifice and IC valve. Patient position: on left side. Duration of exam: 20 minutes. Colon retroflexion performed. Images taken. ASA Classification: II. Tolerance: excellent.  Monitoring: Pulse and BP monitoring, Oximetry used. Supplemental O2 given. at 2 Liters.  Colon Prep Used Golytely for colon prep. Prep results: excellent.  Sedation Meds: Patient assessed and found to be appropriate for moderate (conscious) sedation. Fentanyl 50 mcg. given IV. Versed 5 mg. given IV.  Instrument(s): CF 140L. Serial [id number].  Findings - DIVERTICULOSIS: Descending Colon to Sigmoid Colon. Not bleeding. ICD9: Diverticulosis, Colon: 562.10.  - NORMAL EXAM: Cecum to Rectum. Not Seen: Polyps. AVM's. Colitis. Tumors. Melanosis. Crohn's.  - OTHER FINDING: Rectum. Comments: Soft tissue cystic perirectal mass. .   Assessment  Diagnoses: 562.10: Diverticulosis, Colon.    Events  Unplanned Interventions: No intervention was required.  Plans Medication Plan: Referring provider to order medications.  Patient Education: Patient given standard instructions for: Diverticulosis.  Disposition: After procedure patient sent to recovery. After recovery patient sent home.  Scheduling/Referral: Follow-Up prn.   This report was created from the original endoscopy report, which was reviewed and signed by the above listed endoscopist.

## 2010-09-19 NOTE — Progress Notes (Signed)
Summary: ? re appt  Phone Note From Other Clinic Call back at [phone]   Caller: MELANIE FROM DR Dierdre Forth Call For: PATTERSON Reason for Call: Schedule Patient Appt Summary of Call: Melanie from Dr Mikey Bussing office wanted to schedule this patient for a direct colon to rule out col cancer but we need to check if she needs to have an office visit first since she has brain and lung cancer. Patient had a colon in 2006 with Dr Jarold Motto Initial call taken by: Tawni Levy,  April 13, 2009 2:48 PM  Follow-up for Phone Call        appt made for 04-26-2009 at 10 am. melanie will fax records Follow-up by: Harlow Mares CMA (AAMA),  April 14, 2009 10:11 AM

## 2010-09-19 NOTE — Consult Note (Signed)
Summary: [facility]  [facility]   Imported By: Lanelle Bal 03/18/2008 09:29:11  _____________________________________________________________________  External Attachment:    Type:   Image     Comment:   External Document

## 2010-09-19 NOTE — Letter (Signed)
Summary: [facility]  [facility]   Imported By: Freddy Jaksch 06/12/2007 12:11:42  _____________________________________________________________________  External Attachment:    Type:   Image     Comment:   External Document

## 2010-09-19 NOTE — Letter (Signed)
Summary: Valerie [name] Visit  Valerie [name] Visit   Imported By: Valerie [name] 10/09/2007 14:21:57  _____________________________________________________________________  External Attachment:    Type:   Image     Comment:   External Document

## 2010-09-19 NOTE — Letter (Signed)
Summary: External Correspondence--RETURN OFFICE VISIT  External Correspondence--RETURN OFFICE VISIT   Imported By: Freddy Jaksch 03/20/2007 15:16:57  _____________________________________________________________________  External Attachment:    Type:   Image     Comment:   External Document

## 2010-09-19 NOTE — Letter (Signed)
Summary: [facility]  [facility]   Imported By: Lanelle Bal 08/10/2008 11:11:53  _____________________________________________________________________  External Attachment:    Type:   Image     Comment:   External Document

## 2010-11-04 LAB — CREATININE, SERUM
Creatinine, Ser: 0.79 mg/dL (ref 0.4–1.2)
GFR calc Af Amer: >60 mL/min (ref >60)
GFR calc non Af Amer: >60 mL/min (ref >60)

## 2010-11-20 IMAGING — CT CT ABDOMEN W/ CM
2 of 5 series · 16 of 46 positions shown, 18 images · IV contrast (agent unspecified)
Comparison: 05/31/2008

CT CHEST

CLINICAL DATA: Lung cancer.

CT CHEST, ABDOMEN AND PELVIS WITH CONTRAST
TECHNIQUE: Multidetector CT imaging of the chest, abdomen and
pelvis was performed following the standard protocol during bolus
administration of intravenous contrast.
Contrast: 100 ml of Kmnipaque-LCC

[Series 2: cap with st · axial · 0.86mm/px · z∈[-648,-68]mm · 13 of 132 slices shown, 15 images]
[im 8/132  soft-tissue]
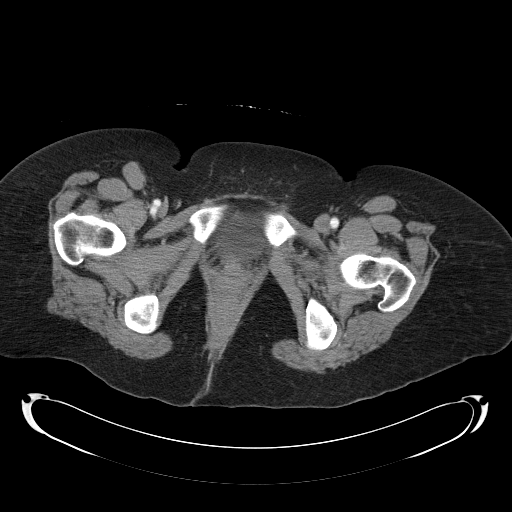
[im 8/132  bone]
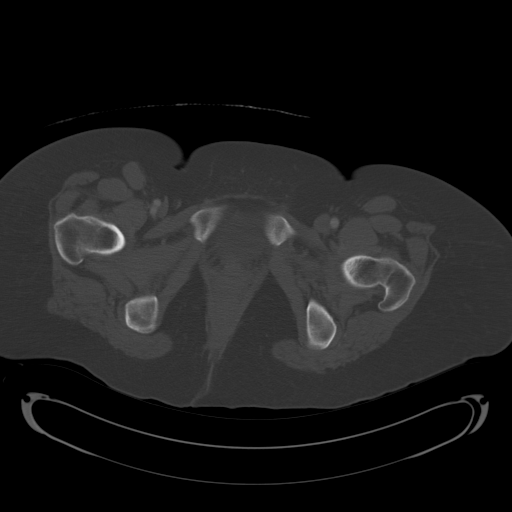
[im 15/132  soft-tissue]
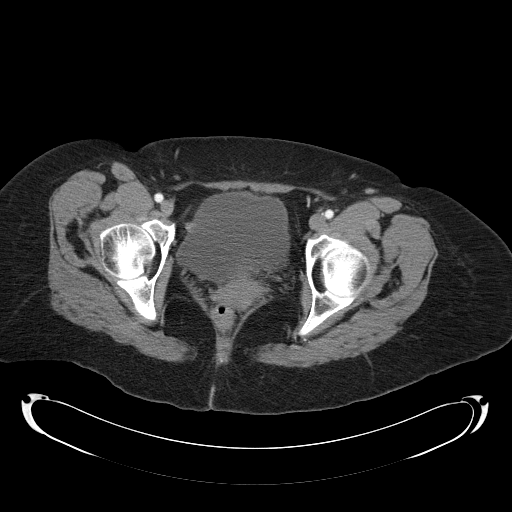
[im 30/132  soft-tissue]
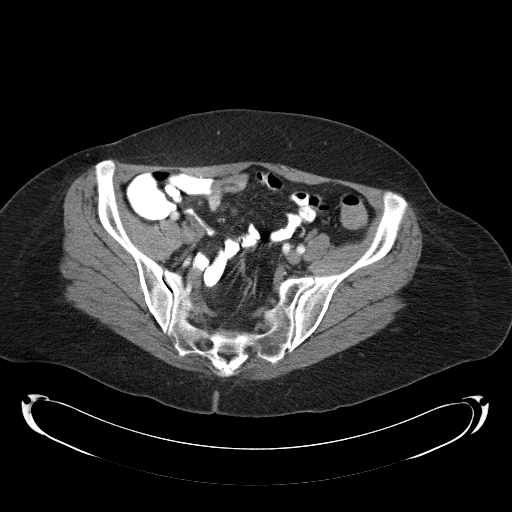
[im 37/132  soft-tissue]
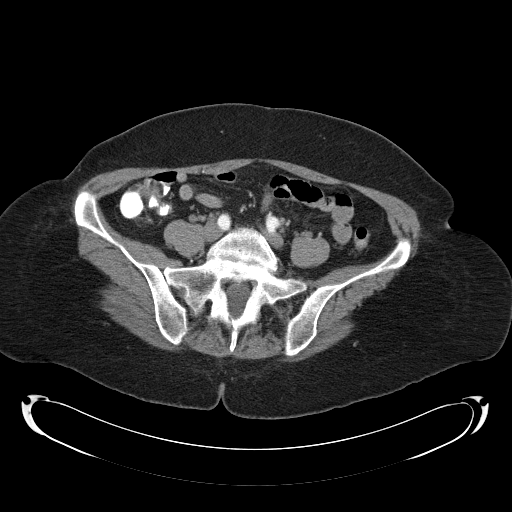
[im 44/132  soft-tissue]
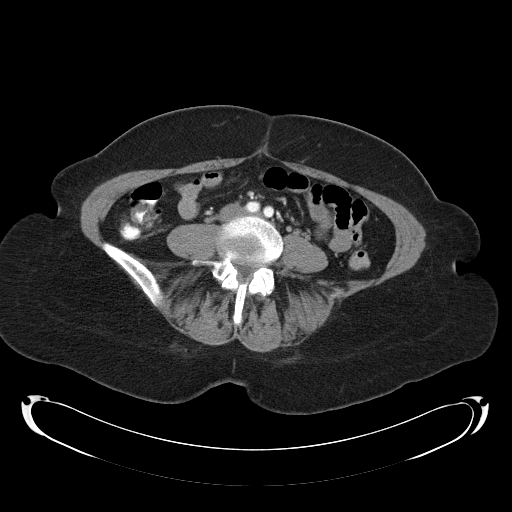
[im 59/132  soft-tissue]
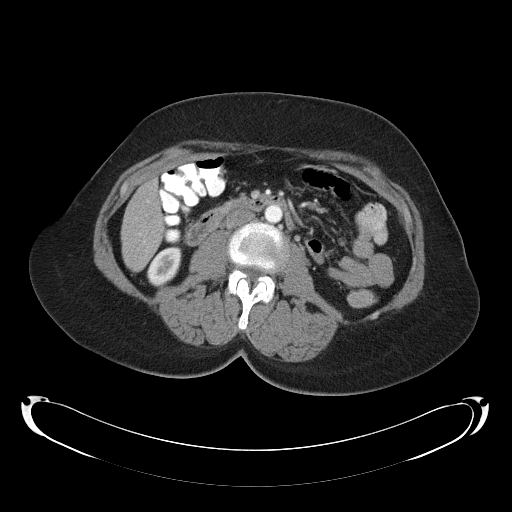
[im 66/132  soft-tissue]
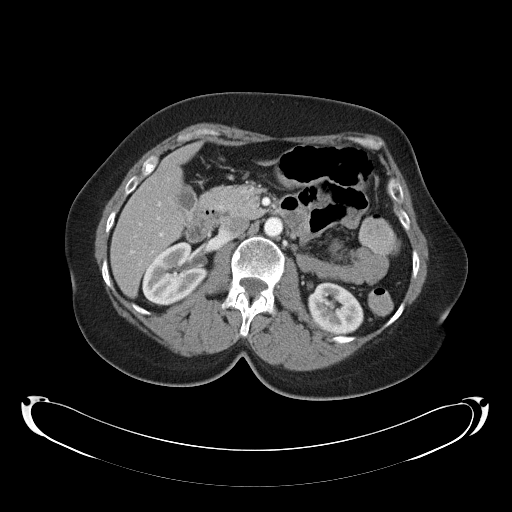
[im 73/132  soft-tissue]
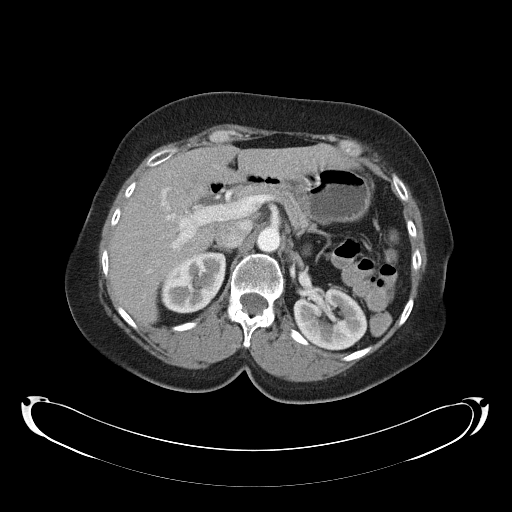
[im 88/132  soft-tissue]
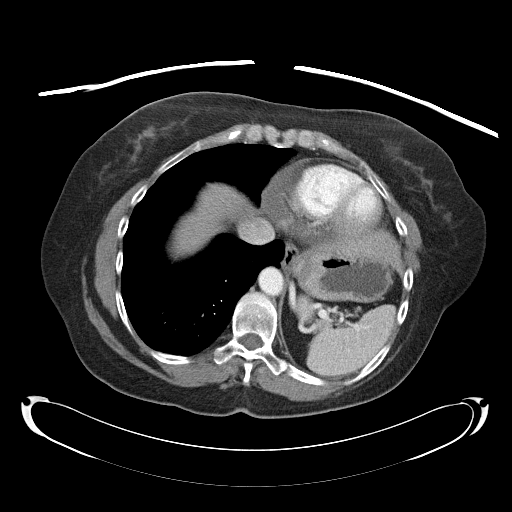
[im 88/132  bone]
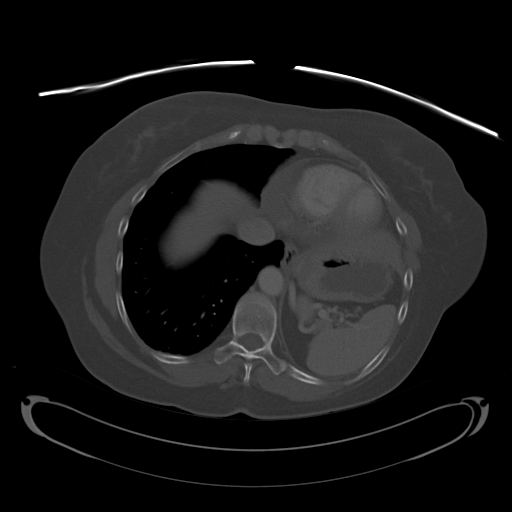
[im 95/132  soft-tissue]
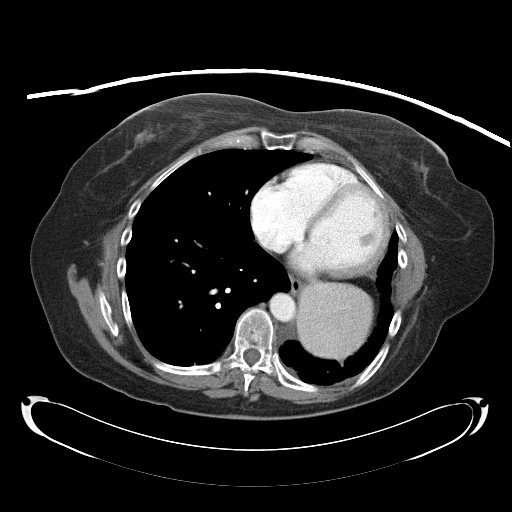
[im 102/132  soft-tissue]
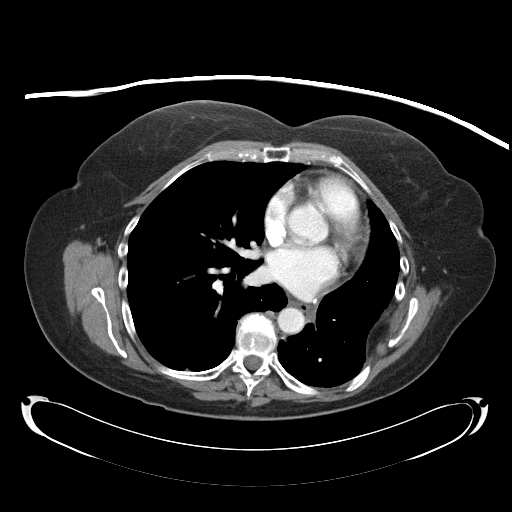
[im 117/132  soft-tissue]
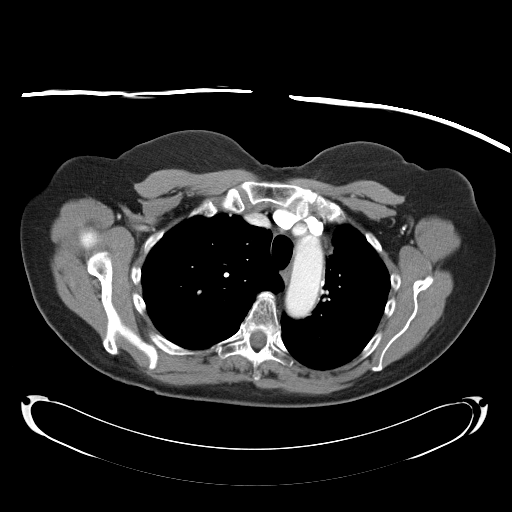
[im 124/132  soft-tissue]
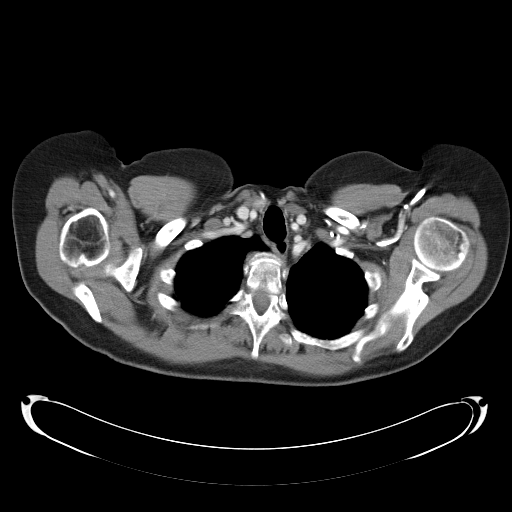

[Series 602: <mpr thick range> · coronal · 1.28mm/px · 3 of 92 slices shown]
[im 31/92  soft-tissue]
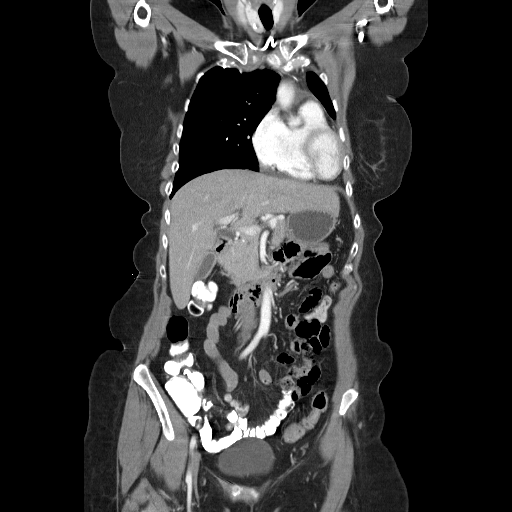
[im 41/92  soft-tissue]
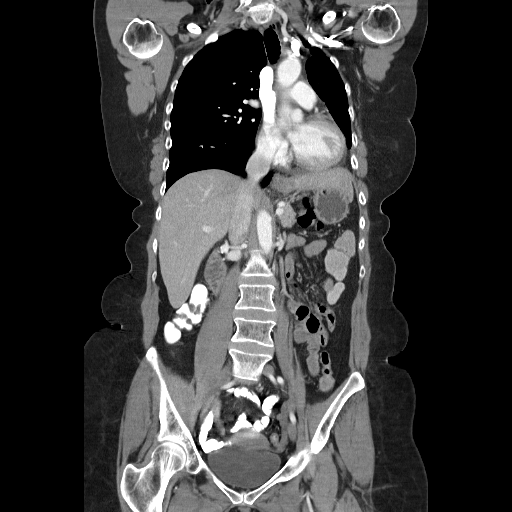
[im 51/92  soft-tissue]
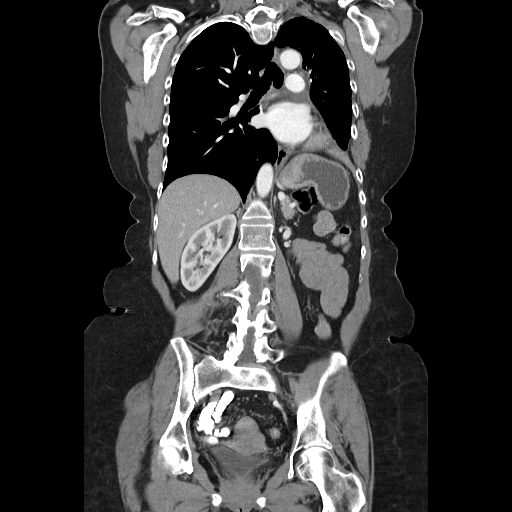

[16 of 46 positions shown; findings below may reference images not displayed]

FINDINGS: The chest wall is stable.  No breast masses,
supraclavicular or axillary adenopathy.  Small calcified right
thyroid nodule is again noted.  No significant bony findings.
Stable degenerative changes in the thoracic spine.

The heart is normal in size.  Persistent pericardial fluid.  No
overt effusion.  The aorta is normal in caliber.  No dissection.
The esophagus is grossly normal.  There is a small hiatal hernia.

Stable surgical changes from a left lower lobe lobectomy.  No
change and small, 4 mm nodule in the right upper lobe.  No new
pulmonary nodules.
IMPRESSION: 1.  Stable CT appearance of the chest.  No findings for recurrent
or residual disease status post left lower lobe lobectomy.
2.  Stable small, 4 mm right upper lobe pulmonary nodule.  No new
lesions are seen.
3.  Stable pericardial fluid.

CT ABDOMEN
FINDINGS: The liver, spleen, pancreas, adrenal glands and kidneys
are unremarkable and stable.  The gallbladder appears normal.  Mild
stable common bile duct dilatation.

The stomach, duodenum, small bowel and colon demonstrate no
significant abnormalities or interval change.  No mesenteric or
retroperitoneal masses or adenopathy.  The aorta is normal in
caliber.  No significant bony findings.
IMPRESSION: Unremarkable and stable CT appearance of the abdomen.  No findings
for metastatic disease.

CT PELVIS
FINDINGS: The rectum, sigmoid colon and visualized small bowel
loops are unremarkable.  The uterus and ovaries appear normal.  No
pelvic mass, adenopathy or free pelvic fluid collection.  The
bladder appears normal.  No inguinal mass or hernia.  The bony
pelvis is intact.
IMPRESSION: Unremarkable and stable CT appearance of the pelvis.  No findings
for metastatic disease.

## 2010-11-30 ENCOUNTER — Encounter (HOSPITAL_COMMUNITY): Payer: Self-pay

## 2010-11-30 ENCOUNTER — Encounter (HOSPITAL_BASED_OUTPATIENT_CLINIC_OR_DEPARTMENT_OTHER): Payer: Medicare Other | Admitting: Internal Medicine

## 2010-11-30 ENCOUNTER — Ambulatory Visit (HOSPITAL_COMMUNITY)
Admission: RE | Admit: 2010-11-30 | Discharge: 2010-11-30 | Disposition: A | Discharge status: Home / Self Care | Payer: Medicare Other | Source: Ambulatory Visit | Attending: Internal Medicine | Admitting: Internal Medicine

## 2010-11-30 ENCOUNTER — Other Ambulatory Visit: Payer: Self-pay | Admitting: Internal Medicine

## 2010-11-30 DIAGNOSIS — C3490 Malignant neoplasm of unspecified part of unspecified bronchus or lung: Secondary | ICD-10-CM | POA: Insufficient documentation

## 2010-11-30 DIAGNOSIS — C3430 Malignant neoplasm of lower lobe, unspecified bronchus or lung: Secondary | ICD-10-CM

## 2010-11-30 DIAGNOSIS — C7949 Secondary malignant neoplasm of other parts of nervous system: Secondary | ICD-10-CM | POA: Insufficient documentation

## 2010-11-30 DIAGNOSIS — C7931 Secondary malignant neoplasm of brain: Secondary | ICD-10-CM | POA: Insufficient documentation

## 2010-11-30 DIAGNOSIS — M899 Disorder of bone, unspecified: Secondary | ICD-10-CM | POA: Insufficient documentation

## 2010-11-30 DIAGNOSIS — Z902 Acquired absence of lung [part of]: Secondary | ICD-10-CM | POA: Insufficient documentation

## 2010-11-30 HISTORY — DX: Malignant neoplasm of unspecified part of unspecified bronchus or lung: C34.90

## 2010-11-30 LAB — CMP (CANCER CENTER ONLY)
ALT(SGPT): 20 U/L (ref 10–47)
AST: 18 U/L (ref 11–38)
Albumin: 3.5 g/dL (ref 3.3–5.5)
Alkaline Phosphatase: 66 U/L (ref 26–84)
BUN, Bld: 14 mg/dL (ref 7–22)
CO2: 29 mEq/L (ref 18–33)
Calcium: 9.4 mg/dL (ref 8.0–10.3)
Chloride: 106 mEq/L (ref 98–108)
Creat: 1.1 mg/dl (ref 0.6–1.2)
Glucose, Bld: 104 mg/dL (ref 73–118)
Potassium: 4.1 mEq/L (ref 3.3–4.7)
Sodium: 146 mEq/L — ABNORMAL HIGH (ref 128–145)
Total Bilirubin: 1.3 mg/dl (ref 0.20–1.60)
Total Protein: 6.6 g/dL (ref 6.4–8.1)

## 2010-11-30 LAB — CBC WITH DIFFERENTIAL/PLATELET
BASO%: 0.6 % (ref 0.0–2.0)
Basophils Absolute: 0 10*3/uL (ref 0.0–0.1)
EOS%: 5.5 % (ref 0.0–7.0)
Eosinophils Absolute: 0.2 10*3/uL (ref 0.0–0.5)
HCT: 39.7 % (ref 34.8–46.6)
HGB: 13.4 g/dL (ref 11.6–15.9)
LYMPH%: 20.7 % (ref 14.0–49.7)
MCH: 30.8 pg (ref 25.1–34.0)
MCHC: 33.8 g/dL (ref 31.5–36.0)
MCV: 91.3 fL (ref 79.5–101.0)
MONO#: 0.3 10*3/uL (ref 0.1–0.9)
MONO%: 9.1 % (ref 0.0–14.0)
NEUT#: 2.4 10*3/uL (ref 1.5–6.5)
NEUT%: 64.1 % (ref 38.4–76.8)
Platelets: 220 10*3/uL (ref 145–400)
RBC: 4.35 10*6/uL (ref 3.70–5.45)
RDW: 13.6 % (ref 11.2–14.5)
WBC: 3.8 10*3/uL — ABNORMAL LOW (ref 3.9–10.3)
lymph#: 0.8 10*3/uL — ABNORMAL LOW (ref 0.9–3.3)

## 2010-11-30 MED ORDER — IOHEXOL 300 MG/ML  SOLN
125.0000 mL | Freq: Once | INTRAMUSCULAR | Status: AC | PRN
  Administered 2010-11-30: 125 mL via INTRAVENOUS

## 2010-12-06 ENCOUNTER — Encounter (HOSPITAL_BASED_OUTPATIENT_CLINIC_OR_DEPARTMENT_OTHER): Payer: Medicare Other | Admitting: Internal Medicine

## 2010-12-06 DIAGNOSIS — C3430 Malignant neoplasm of lower lobe, unspecified bronchus or lung: Secondary | ICD-10-CM

## 2010-12-06 DIAGNOSIS — C7949 Secondary malignant neoplasm of other parts of nervous system: Secondary | ICD-10-CM

## 2010-12-06 DIAGNOSIS — C7931 Secondary malignant neoplasm of brain: Secondary | ICD-10-CM

## 2011-01-05 NOTE — Op Note (Signed)
Valerie [name], Valerie [name]           ACCOUNT NO.:  [id number]   MEDICAL RECORD NO.:  [id number]          PATIENT TYPE:  AMB   LOCATION:  SDS                          FACILITY:  [facility]   PHYSICIAN:  Leonie Man, M.D.   DATE OF BIRTH:  September 17, 1945   DATE OF PROCEDURE:  02/22/2006  DATE OF DISCHARGE:  02/22/2006                                 OPERATIVE REPORT   PREOPERATIVE DIAGNOSIS:  Fistula-in-ano.   POSTOPERATIVE DIAGNOSIS:  Fistula-in-ano.   PROCEDURE:  Anal fistulotomy.   SURGEON:  Dr. Leonie Man.   ASSISTANT:  OR tech.   ANESTHESIA:  General.   Ms. Purington is a 65 year old female with a recurrent and persistent anal  fistula who comes to the operating room now for fistulotomy. The patient has  undergone I&D of perianal abscesses in the past before.  Incidentally, the  patient is currently undergoing treatment for lung carcinoma.   PROCEDURE:  Following the induction of satisfactory general anesthesia with  the patient positioned in the lithotomy position, perianal tissues were  prepped and draped to be included in the sterile operative field. A blunt  needle is passed up into the fistula and hydrogen peroxide injected into the  fistula with an anoscope in place to ascertain the point of entrance into  the anus.  It is noted that the entrance at the region of the dentate line  midline posteriorly.  A probe is then introduced into the fistula and  brought out on the internal opening.  The sphincter muscle is palpated, this  lesion did not pass through the internal anal sphincter but did pass through  approximately one-third of the external sphincter.  Using electrocautery,  this fissure is dissected down upon until the fistula is opened up. The  fistulous granulation tissue curetted out and the fistula is then  marsupialized with a running suture of 2-0 chromic catgut.  This was then  packed with Xeroform gauze, sterile dressings applied to the wound, the  anesthetic  reversed and the patient removed from the operating room to the  recovery room in stable condition.  She tolerated the procedure well.      Leonie Man, M.D.  Electronically Signed     PB/MEDQ  D:  03/13/2006  T:  03/13/2006  Job:  [id number]

## 2011-01-05 NOTE — Op Note (Signed)
[place]. [facility]  Patient:    Valerie [name], Valerie [name] Visit Number: [id number] MRN: [id number]          Service Type: SUR Location: 2300 2303 01 Attending Physician:  Cameron Proud Dictated by:   D. Karle Plumber, M.D. Proc. Date: 05/29/01 Admit Date:  05/29/2001   CC:         Titus Dubin. Alwyn Ren, M.D. [facility]  [name], M.D.   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  OPERATION PERFORMED:  Left video-assisted thoracic surgery, thoracotomy, left lower lobe lobectomy.  SURGEON: D. Karle Plumber, M.D.  FIRST ASSISTANT:  Sherrie George, P.A.  DESCRIPTION OF PROCEDURE:  After percutaneous insertion of all monitoring lines, the patient was turned to the left lateral thoracotomy position.  She had somewhat depressed FVC; it was thought to be secondary to her sarcoid.  A dual-lumen tube was inserted and two trocar sites were made in the anterior and posterior axillary lines at the seventh intercostal space.  Two trocars were inserted.  Exploration was carried out and the cancer was seen in the superior segment of the left lower lobe.  For this reason, a vertical incision was made over the fifth intercostal space for approximately 6 to 7 cm.  The latissimus was reflected laterally, the serratus was split.  Two Tuffiers were placed at right angles.  Dissection was then started in the fissure.   First the inferior portion of the fissure was dissected down and there were some large nodes and these were dissected out off the distal portion of the pulmonary artery.  The fissure was divided with two applications of the Autosuture 60 stapler.  The inferior pulmonary ligament was taken down and the inferior pulmonary vein was dissected out and looped with a vascular tape. Attention  was then turned to the superior segment and this was dissected out and divided with two applications of the Autosuture 60 stapler.  Several more 11-L nodes were dissected free and 10-L nodes were dissected free.  There was some marked scarring, however, of some 10-L nodes posterior to the pulmonary artery and these were carefully dissected off and the superior segment to the lower lobe was doubly ligated with 2-0 silk, clipped and divided.  Then the basilar artery was carefully dissected out and stapled with an Autosuture stapler and the rest of the 11-L and 10-L nodes were dissected free.  The inferior pulmonary vein was divided with one application of the Autosuture 30 stapler and then the bronchus was dissected and stapled with the TL-30 and divided distally.  Frozen section revealed adenocarcinoma with negative margins.  The rest of the 10-L nodes were dissected free from around the bronchus.  There was no air leak seen.  FocalSeal was applied to the suture line using first the primer, then the sealant and finally the ultraviolet wand for ______ .  Two chest tubes were brought through the trocar sites and tied in place with 0 silk.  The chest was closed with two pericostals, #1 Vicryl in the muscle layer, 2-0 Vicryl in the subcutaneous tissue, 3-0 Vicryl as a subcuticular stitches and Steri-Strips were applied.  Patient returned to the recovery room in stable condition. Dictated by:   D. Karle Plumber, M.D. Attending Physician:  Cameron Proud DD:  05/30/01 TD:  05/30/01 Job: [id number]

## 2011-01-05 NOTE — Op Note (Signed)
NAMEBRIANNIE, GUTIERREZ           ACCOUNT NO.:  [id number]   MEDICAL RECORD NO.:  [id number]          PATIENT TYPE:  AMB   LOCATION:  DSC                          FACILITY:  [facility]   PHYSICIAN:  Leonie Man, M.D.   DATE OF BIRTH:  [birth year]   DATE OF PROCEDURE:  DATE OF DISCHARGE:                                 OPERATIVE REPORT   DATE OF OPERATION:  August 15, 2004.   PREOPERATIVE DIAGNOSES:  1.  Epidermoid cyst of left neck.  2.  Abscessed fistula en ano.  3.  Fibrolipoma of the left buttock.   POSTOPERATIVE DIAGNOSES:  1.  Epidermal cyst of left neck.  2.  Abscessed fistula en ano.  3.  Fibrolipoma of the left buttock.   NOTE:  Ms. Derenzo is a 65 year old female, who presented originally with  history of a left-sided neck mass and an enlarging mass over the left  buttock.  She has a history of adenocarcinoma of the lung in the past and is  concerned about these masses.  She is brought to the operating room after  the risks and potential benefits of surgery had been discussed and all  questions answered.  Her intention was to remove the epidermoid cyst of the  neck and to remove what I felt to be a fibrolipoma of the buttock.  However,  when she came to the operating room, she informed us that there was another  area near the buttock cheeks that appeared to be infected.  On inspection of  this area, there is a perianal abscess, which would need to be drained.  It  was on the left buttock cheek and very close to the area of the fibrolipoma.  It was therefore elected not to resect the fibrolipoma on this occasion in  that it would very likely get infected.  We did, however, go ahead and  planned to open the perianal abscess and drain it.   The patient understood this and gave her consent.   PROCEDURE:  Following the induction of satisfactory sedation with the  patient positioned supinely with the head and neck turned towards the right,  the left neck was prepped and  draped to be included in the sterile operative  field.  The area near the mass in the neck was then marked out so as to  effect an elliptical incision around the neck mass, which measured  approximately 2.3 cm.  This area was then infiltrated with 0.5% Marcaine  with epinephrine, and an elliptical incision carried down over the mass.  This was deepened down to the capsule of the mass, and the mass was  dissected free on all sides, removed, and forwarded for pathologic  evaluation.  This clinically appeared to be an epidermoid cyst.  Hemostasis  was obtained within the wound.  The wound was then closed after sponge  counts were corroborated with interrupted 3-0 Vicryl sutures in the deep  tissues and a 5-0 Monocryl suture on the skin.   Attention was then turned to the buttock lesion with the patient in the left  lateral recumbent position.  There was a darkened  perianal area just  adjacent to the area of the fibrolipoma.  Again, as noted above, it was  decided not to remove the fibrolipoma, but to leave that to another time  since it was in such close proximity to this abscess.  The area was then  prepped with Betadine and draped to be included  in the sterile operative  field.  I then infiltrated the region around the abscess with 0.5% Marcaine  with epinephrine.  I opened the abscess in a radial fashion extending from  just outside the sphincters out through the abscess and carried the  dissection down into the abscess cavity where it was cultured.  I probed the  abscess cavity where the fistula went directly towards the anus, however,  with a probe in place I could not directly find an anal or a rectal opening.  I used a curette to clean out the abscess cavity as far as the curette could  reach and then cleaned the wound in its entirety.  I left a one-inch gauze  pack within the depths of the wound to be removed later.  Sterile dressings  were placed on the wound, the anesthetic  reversed, and the patient removed  from the operating room to the recovery room in stable condition.  I did not  attempt to remove the fibrolipoma.      Patr   PB/MEDQ  D:  08/15/2004  T:  08/15/2004  Job:  [id number]   cc:   Erlinda Hong, Dr.

## 2011-01-05 NOTE — H&P (Signed)
[place]. [facility]  Patient:    Valerie [name], Valerie [name] Visit Number: [id number] MRN: [id number]          Service Type: Attending:  D. Karle Plumber, M.D. Dictated by:   [name], P.A.-C. Adm. Date:  05/29/01   CC:         Geraldo Pitter, M.D.  Charles A. Clearance Coots, M.D.   History and Physical  DATE OF BIRTH:  10-06-1945  CHIEF COMPLAINT:  Left lung lesion.  HISTORY OF PRESENT ILLNESS: Valerie [name] is a pleasant 65 year old black female referred by Dr. Clearance Coots for evaluation of left lower lung lesion.  She presented with increasing shortness of breath and night sweats.  A chest x-ray revealed a left lower lobe mass.  Follow-up CT showed this mass to be 2.6 x 3.4 cm in the superior segment of the left lower lobe.  No lymphadenopathy.  A CT guided biopsy demonstrated adenocarcinoma, bronchioalveolar type.  The PFT shows an FEV of 2.95, 80% predicted, and an FEV of 0.92, 34% predicted.  Of note, the patient traveled to [place] recently, although she took malaria pills while malaria there.  Dr. Edwyna Shell recommended to proceed with left lower lobectomy, scheduled for May 29, 2001, with bronchoscopy.  No cough, sputum production, fever, chills, or unexplained weight loss.  She experiences shortness of breath and dyspnea on exertion.  No PND.  No angina.  No arrhythmias.  No peripheral edema.  No hemoptysis, symptoms of TSH, CVA, amaurosis fugax, pneumonia, PE, or DVT.  No nausea, vomiting or diaphoresis.  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 Severe obstructive pulmonary disease.  PAST SURGICAL HISTORY:  Status post needle biopsy by Dr. Fredia Sorrow on May 14, 2001.  MEDICATIONS:  Multivitamin q.d.  ALLERGIES:  No known drug allergies.  REVIEW OF SYSTEMS:  See HPI and past medical history for significant positives.  No diabetes or kidney disease.  She complains of "nose stuffed up all of the  time."  FAMILY HISTORY:  Mother alive at 28 with a history of diabetes.  Otherwise, noncontributory.  SOCIAL HISTORY:  Married, two children.  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  She quit 20 years ago the use of cigarettes.  She smoked while in college.  She denies any alcohol intake.  PHYSICAL EXAMINATION: GENERAL:  Well-developed, well-nourished, 65 year old black female, alert and in no acute distress, alert and oriented x 3.  VITAL SIGNS:  Blood pressure 130/80, pulse 74, respirations 18.  HEENT:  Head normocephalic, atraumatic.  PERRLA, EOMI.  Funduscopic exam within normal limits.  NECK:  Supple; no JVD, no lymphadenopathy.  CHEST:  Symmetric on inspiration.  Lungs show no rhonchi, wheezes, rales or lymphadenopathy.  CARDIOVASCULAR:  Regular rate and rhythm with a short 1/6 systolic murmur and an S4 gallop.  No rubs.  ABDOMEN:  Soft and nontender.  Bowel sounds are normal.  No bruits.  GENITOURINARY:  Deferred.  EXTREMITIES:  No cyanosis, clubbing or edema.  No ulcerations.  Warm temperature.  Peripheral pulses, carotids, femoral, popliteal, dorsalis pedis, and posterior tibialis are 2+ bilaterally.  NEUROLOGICAL:  Nonfocal.  Gait is stable.  DTRs 2+ bilaterally.  Muscle strength is 5/5.  ASSESSMENT AND PLAN:  Left lower lobectomy with bronchoscopy at [facility] on May 29, 2001.  Dr. Edwyna Shell has seen and evaluated this patient prior to this admission and has explained the risks and benefits of the procedure, and the patient has  agreed to continue. Dictated by:   [name], P.A.-C. Attending:  D. Karle Plumber, M.D. DD:  05/27/01 TD:  05/27/01 Job: [id number]

## 2011-01-05 NOTE — Discharge Summary (Signed)
[place]. [facility]  Patient:    Valerie [name], Valerie [name] Visit Number: [id number] MRN: [id number]          Service Type: SUR Location: 2000 2016 01 Attending Physician:  Valerie [name] Dictated by:   Valerie [name], P.A.-C. Admit Date:  05/29/2001 Discharge Date: 06/04/2001   CC:         Valerie [name], M.D.  Valerie [name]. Valerie [name], M.D. 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  ADDITIONAL DIAGNOSES: 1. History of sarcoidosis. 2. Chronic obstructive pulmonary disease. 3. Fibrocystic breast disease.  PROCEDURES PERFORMED: 1. Left VATS. 2. Left thoracotomy with left lower lobectomy and lymph node dissection.  HISTORY:  The patient is a 65 year old female who presented with increasing shortness of breath and night sweats.  Chest x-ray revealed a left lower lobe mass which was confirmed by a followup CT scan.  A CT guided biopsy was performed which showed a bronchoalveolar adenocarcinoma.  She was referred by Dr. Coral [name] to Dr. Edwyna [name] for further evaluation and treatment and it was felt that she should proceed with left lower lobectomy at this time.  HOSPITAL COURSE:  She was admitted on May 29, 2001, and taken to the operating room where she underwent a left lower lobectomy with lymph node dissection.  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  She tolerated the procedure well and was transferred to the floor in stable condition.  Postoperative, she has done well.  Her chest tubes have been removed without difficulty.  Her followup chest x-rays have been within normal limits.  While in-house, an oncology consultation was obtained for the patient.  It was felt that she might be a candidate for adjuvant therapy.  A brain and bone scan were both performed and both revealed  no evidence of metastasis.  She was also seen by radiation oncology and it was recommended that she proceed with 6 to 6-1/2 weeks worth of radiation therapy.  At the time of discharge, she was still considering chemotherapy.  Followup appointments will be scheduled with both Dr. Truett [name] and radiation oncology to try to further delineate what adjuvant treatment will be arranged.  By postoperative day #6, she was maintaining O2 saturations of 96% on room air.  She was afebrile and all vital signs were stable.  Her chest x-ray revealed no pneumothorax or any acute process.  It was felt that she could be discharged home.  DISCHARGE INSTRUCTIONS:  She is to refrain from driving or heavy lifting.  She is asked to clean incisions daily with soap and water.  She should continue her same preoperative diet.  DISCHARGE MEDICATIONS: 1. She will resume multivitamin daily. 2. Given prescription for Darvocet-N 100 1-2 q.4h. p.r.n. pain.  DISCHARGE FOLLOWUP:  She will follow up with Dr. Edwyna [name] in the office in one week and will have a chest x-ray performed at the [facility] one hour prior to this appointment.  She also has followup appointments scheduled with Dr. Truett [name] and Dr. Dorna [name].  She should call our office if she experiences any problems in the interim. Dictated by:   Valerie [name], P.A.-C. Attending Physician:  Valerie [name] DD:  06/13/01 TD:  06/16/01 Job: [id number]

## 2011-01-05 NOTE — Discharge Summary (Signed)
NAMEMARYJANE, BENEDICT           ACCOUNT NO.:  [id number]   MEDICAL RECORD NO.:  [id number]          PATIENT TYPE:  INP   LOCATION:  3009                         FACILITY:  [facility]   PHYSICIAN:  Coletta Memos, M.D.     DATE OF BIRTH:  10-22-1945   DATE OF ADMISSION:  09/21/2005  DATE OF DISCHARGE:  09/24/2005                                 DISCHARGE SUMMARY   ADMITTING DIAGNOSES:  Left occipital tumor.   DISCHARGE DIAGNOSES:  Left occipital tumor.   PROCEDURES:  Left occipital craniectomy with self guidance for tumor  resection.   COMPLICATIONS:  None.   DISCHARGE STATUS:  Alive and well.   DISCHARGE MEDICATIONS:  Decadron taper.   DISCHARGE DESTINATION:  Home.   Wound cleaned and dried showing no signs of infection.  Neurological  examination at discharge is normal.  Full visual fields.  Full extraocular  movements.  No drifts.  Normal motor examination.  She will have return  appointment to see me in approximately one week for staple removal.           ______________________________  Coletta Memos, M.D.     KC/MEDQ  D:  09/24/2005  T:  09/24/2005  Job:  [id number]

## 2011-01-05 NOTE — Procedures (Signed)
[place]. [facility]  Patient:    Valerie [name], Valerie [name] Visit Number: [id number] MRN: [id number]          Service Type: SUR Location: 2300 2303 01 Attending Physician:  Cameron Proud Dictated by:   Cliffton Asters Ivin Booty, M.D. Proc. Date: 05/29/01 Admit Date:  05/29/2001   CC:         Anesthesia Department   Procedure Report  PROCEDURE:  Insertion of epidural catheter for postoperative analgesia.  DESCRIPTION OF PROCEDURE:  Ms. Calixte was brought to the operating room by Dr. Edwyna Shell today for a thoracoscopy procedure, which resulted in a lobectomy.  She had been spoken with by me prior to the operation concerning postoperative analgesia using an epidural route.  She understood the risks and benefits of this as explained to her and wished to have this performed if needed.  During the operation the thoracoscopy was converted to a thoracotomy, and it was felt that an epidural would be appropriate for her.  At conclusion of the operation she remained in the right lateral position. Her back was prepped in a sterile fashion over the thoracic area.  At the T10 interspace, a 17-gauge Tuohy needle was passed to a loss of resistance to 2 cc of normal saline.  There was a negative aspiration of blood or CSF.  An 18-gauge epidural catheter was passed through the needle to a depth of 4 cm below the end of the needle.  The needle was removed and the catheter was secured.  There was a negative aspiration of blood or CSF from the catheter, and a bolus dose of 14 cc of 0.5% lidocaine containing 100 mcg of fentanyl was then given through the catheter.  The patient was turned supine, suctioned, and extubated in the operating room uneventfully.  She was taken to the PACU in good condition.  She will be followed there while being run on a constant infusion of normal saline containing 5 mcg/cc of fentanyl. Dictated by:   Cliffton Asters Ivin Booty, M.D. Attending Physician:   Cameron Proud DD:  05/29/01 TD:  05/30/01 Job: [id number]

## 2011-01-05 NOTE — Op Note (Signed)
NAMEANGLES, TREVIZO           ACCOUNT NO.:  [id number]   MEDICAL RECORD NO.:  [id number]          PATIENT TYPE:  INP   LOCATION:  3110                         FACILITY:  [facility]   PHYSICIAN:  Coletta Memos, M.D.     DATE OF BIRTH:  1946-06-16   DATE OF PROCEDURE:  DATE OF DISCHARGE:                                 OPERATIVE REPORT   PREOPERATIVE DIAGNOSIS:  Left occipital brain tumor.   POSTOPERATIVE DIAGNOSIS:  Left occipital brain tumor.   PROCEDURE:  Left occipital craniotomy with stereotactic guidance for tumor  resection.   COMPLICATIONS:  None.   SURGEON:  Coletta Memos, M.D.   ASSISTANT:  Payton Doughty, M.D.   procedure was done with microdissection.   INDICATIONS:  Valerie [name] is a 65 year old with a known history of  lung cancer. She appears to have what is a metastatic lesion in the left  occipital region uniformly enhancing on both CT and MRI. I recommended; and  she agreed to undergo operative resection of the mass.   OPERATIVE NOTE:  [name] was brought to the operating room, intubated,  and placed under general anesthetic. Her head was prepped with a 10-minute  Hibiclens shampoo; and then alcohol rinse. She was then positioned in a  [place] with her head turned so that the lesion was accessible in the  surgical field. She had a Foley catheter and arterial line placed prior to  this, under sterile conditions. After the 310 head holder was placed,  stereotactic localization was then completed after points were put into the  stealth system and then localized to the patient. Once registration was  complete, the patient was then prepped and then draped. I infiltrated 10 mL  of 1/2% lidocaine, 1:200,000 strength epinephrine. I opened the skin with a  #15 blade; and use a cerebellar retractor to hold the scalp edges back. I  then turned a small craniotomy overlying where the tumor should be.   After taking out the bone flap, I then opened the dura basing it  inferiorly.  I then looked at what appeared to be normal brain. After using the stealth  and this showing that I had to localized it correctly; I then with Dr. Temple Pacini  assistance used an ultrasound probe. The ultrasound showed that I was over  the mass. It was such that the CT scan had initially given me the impression  that I would see an abnormality on the surface, but that was not to be the  case. The lesion was about a centimeter in from the surface. I was then able  remove that without difficulty with Dr. Temple Pacini assistance; then with the use  of micro instruments. It was grayish and firm in nature. After removing  that, and after removing what I considered abnormal tissue around it; I then  irrigated the wound. I then ensured hemostasis in the resection site.   I then placed a piece of Duragen over the craniotomy site. I placed the bone  flap back using both __________ flap and an ostium plate.  I replaced the  craniotomy flap. I then closed the wound  in layered fashion; using Vicryl  sutures in the subgaleal layer, staples for the skin; and I placed sterile  ointment on it and a piece of Telfa. The patient tolerated the procedure  well; awoke, moving all extremities, doing well.           ______________________________  Coletta Memos, M.D.     KC/MEDQ  D:  09/21/2005  T:  09/21/2005  Job:  [id number]

## 2011-01-05 NOTE — Op Note (Signed)
NAMECONSANDRA, Valerie [name]           ACCOUNT NO.:  [id number]   MEDICAL RECORD NO.:  [id number]          PATIENT TYPE:  AMB   LOCATION:  SDS                          FACILITY:  [facility]   PHYSICIAN:  Leonie Man, M.D.   DATE OF BIRTH:  Dec 19, 1945   DATE OF PROCEDURE:  02/22/2006  DATE OF DISCHARGE:                                 OPERATIVE REPORT   PREOPERATIVE DIAGNOSIS:  Anal fistula.   POSTOPERATIVE DIAGNOSIS:  Transsphincteric anal fistula.   PROCEDURE:  Anal fistulotomy with marsupialization   SURGEON:  Leonie Man, M.D.   ASSISTANT:  OR tech.   ANESTHESIA:  General.   [name] is a 65 year old female who underwent I&D of a perianal abscess  in the past.  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  She comes to the operating room  for a fistulotomy after the risks and potential benefits of surgery have  been discussed, including the risk of possible incontinence, possible need  for a seton suture.  She understands and gives her consent to same.   PROCEDURE:  Following induction of satisfactory general anesthesia, the  patient is in lithotomy position and the perianal tissues are prepped and  draped to be included in the sterile operative field.  The area of the  fistula was probed with a silver probe and then I injected peroxide into the  fistula to note the internal opening of the fistula with the anoscope in  place.  The probe was passed into the internal opening.  The probe about  passed approximately through the distal one-third of the sphincter muscle.  We then proceeded to make an incision down through the perineum down,  through the inferior one-third of this sphincter muscle into the fistulous  cavity.  The fistula was cleaned of all its debris and then hemostasis was  assured with electrocautery.  I marsupialized the fistula with a running  suture of 2-0 chromic catgut.  The tract was then packed with Xeroform  gauze, sponge and instrument counts verified, the anesthetic  reversed and  the patient removed from the operating room to the recovery room in stable  condition after sterile dressings had been placed over the wound.      Leonie Man, M.D.  Electronically Signed     PB/MEDQ  D:  02/22/2006  T:  02/22/2006  Job:  [id number]

## 2011-01-05 NOTE — Consult Note (Signed)
[place]  Patient:    Valerie [name], Valerie [name] Visit Number: [id number] MRN: [id number]          Service Type: SUR Location: 2300 [id number] Attending Physician:  Cameron Proud Dictated by:   Rosemarie Ax, N.P. Proc. Date: 05/29/01 Admit Date:  05/29/2001   CC:         Titus Dubin. Wyvonnia Lora, M.D.  [facility]  D. Karle Plumber, M.D.   Consultation Report  REFERRING PHYSICIAN:  D. Karle Plumber, M.D.  DATE OF BIRTH:  07-23-1946  REASON FOR CONSULTATION:  Left lung mass.  HISTORY OF PRESENT ILLNESS:  This is a 65 year old female with a history of sarcoidosis diagnosed 10 to 12 years ago.  Her last steroid use was in 1995. She presented with increased dyspnea over the past summer and was referred by Dr. Marga Melnick to Dr. Karle Plumber for bronchoscopy and VATs after a chest CT on May 08, 2001, revealed a 2.6 x 3.4 cm mass, superiorly in the left lower lobe.  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  On May 29, 2001, Valerie [name] underwent a bronchoscopy, VATS, and left lower lobectomy with node dissection.  Pathology report is still pending.  PAST MEDICAL HISTORY: 1. Sarcoidosis. 2. Fibrocystic breasts. 3. COPD.  PAST SURGICAL HISTORY: 1. Bronchoscopy 10 to 12 years ago with the diagnosis of sarcoidosis    being made. 2. Right breast biopsy. 3. Uterine biopsy June 2002, benign.  ALLERGIES:  No known drug allergies.  MEDICATIONS:  The patient has only been taking a multivitamin.  FAMILY HISTORY:  Mother died at 41 from diabetes mellitus and brain tumor. Father died at 58 of old age.  There is one sister alive and well.  SOCIAL HISTORY:  The patient lives in [place] and has been married to her husband, Everlean Alstrom, for 26 years.  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  She smoked a few years while in college but rarely more than a couple of cigarettes a day.  She denies any ethanol use.  REVIEW OF SYSTEMS:  Valerie [name] denies headaches.  She has had some facial changes which she attributes to just needing the glasses.  She has had increased dyspnea with exertion.  She denies any shortness of breath at rest. She denies any cough or hemoptysis.  She has noted increase in fatigue and weakness over the past few months.  She denies nausea, vomiting, or indigestion.   Valerie [name] does have some urinary frequency, but she denie urgency, dysuria, or incontinence.  She has had no edema of the extremities but has noted occasional night sweats.  PHYSICAL EXAMINATION:  GENERAL:  This is a 65 year old African-American female, somewhat drowsy when being interviewed in the recovery room.  HEENT:  Normocephalic.  PERRLA.   Sclerae anicteric.  Mucous membranes moist. Oropharynx without lesions or plaque.  LUNGS:  Left chest tube intact.  Breath sounds decreased both bases.  CARDIOVASCULAR:  Regular rate and rhythm.  No murmur, no gallop.  ABDOMEN:  Obese, nontender.  No hepatomegaly and no splenomegaly appreciated.  EXTREMITIES:  No clubbing, cyanosis, or edema.  NEUROLOGIC:  Alert and oriented x 3.  Cranial nerves II-XII grossly intact.  LABORATORY DATA:  WBC 4.4, hemoglobin 13.2, hematocrit 38.7, platelets 226,000.  PT 13.3, INR 1.0, PTT 30.  Sodium 136, potassium 3.9, chloride 108, CO2  23, glucose 109, BUN 9, creatinine 0.8.  Alkaline phosphatase 85, albumin 4.4, calcium 9.4.  ASSESSMENT/PLAN:  This is a 65 year old African-American female who is status post left lower lobectomy, pathology report pending.  Previous CT-guided biopsy on May 14, 2001, was positive for bronchoalveolar adenocarcinoma.  The patient was seen and examined by Dr. Rolm Baptise who had a brief discussion with the patient and the family and would prefer to  await final pathology to make treatment recommendations.  Thank you very much for the consultation, and Dr. Truett Perna will be glad to follow along with you. Dictated by:   Rosemarie Ax, N.P. Attending Physician:  Cameron Proud DD:  05/30/01 TD:  05/31/01 Job: [id number]

## 2011-01-14 IMAGING — CR DG THORACIC SPINE 3V
3 series · 3 of 3 positions shown · non-contrast
Comparison: [HOSPITAL] there are echoic abdominal pelvic
CT 11/24/2008.

CLINICAL DATA: Upper and lower back pain.  Osteoporosis.  Lung
cancer.  Assessment for possible compression fracture.

THORACIC SPINE - 2 VIEW + SWIMMERS

[t t-spine a.p.]
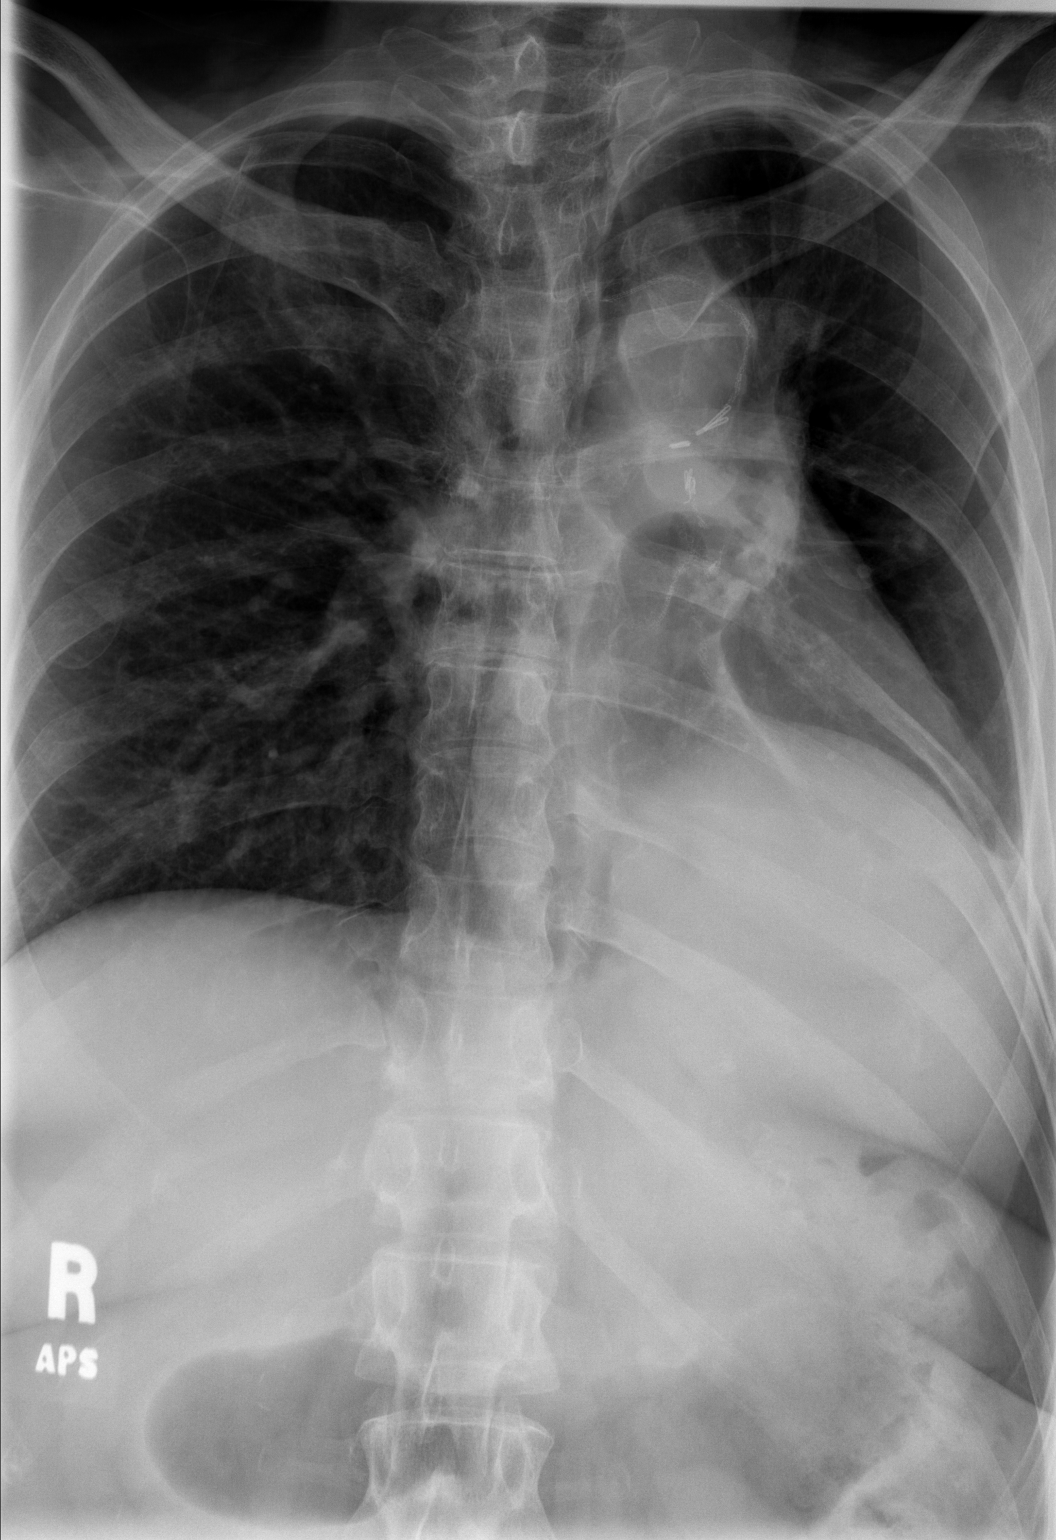

[t t-spine lat *]
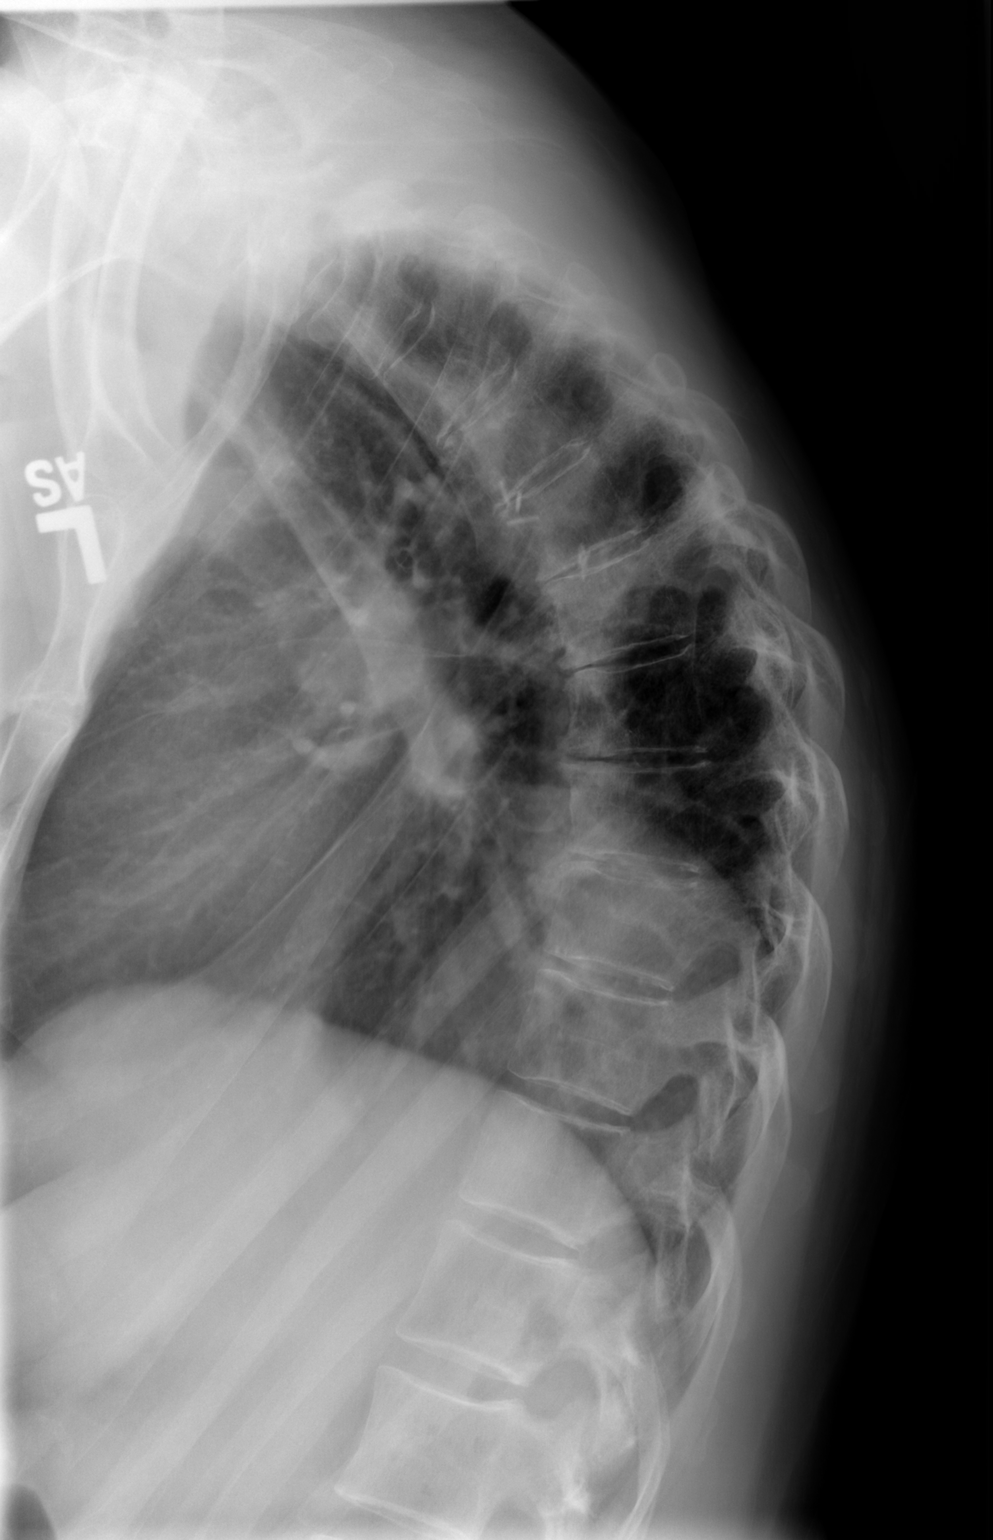

[t swimmers]
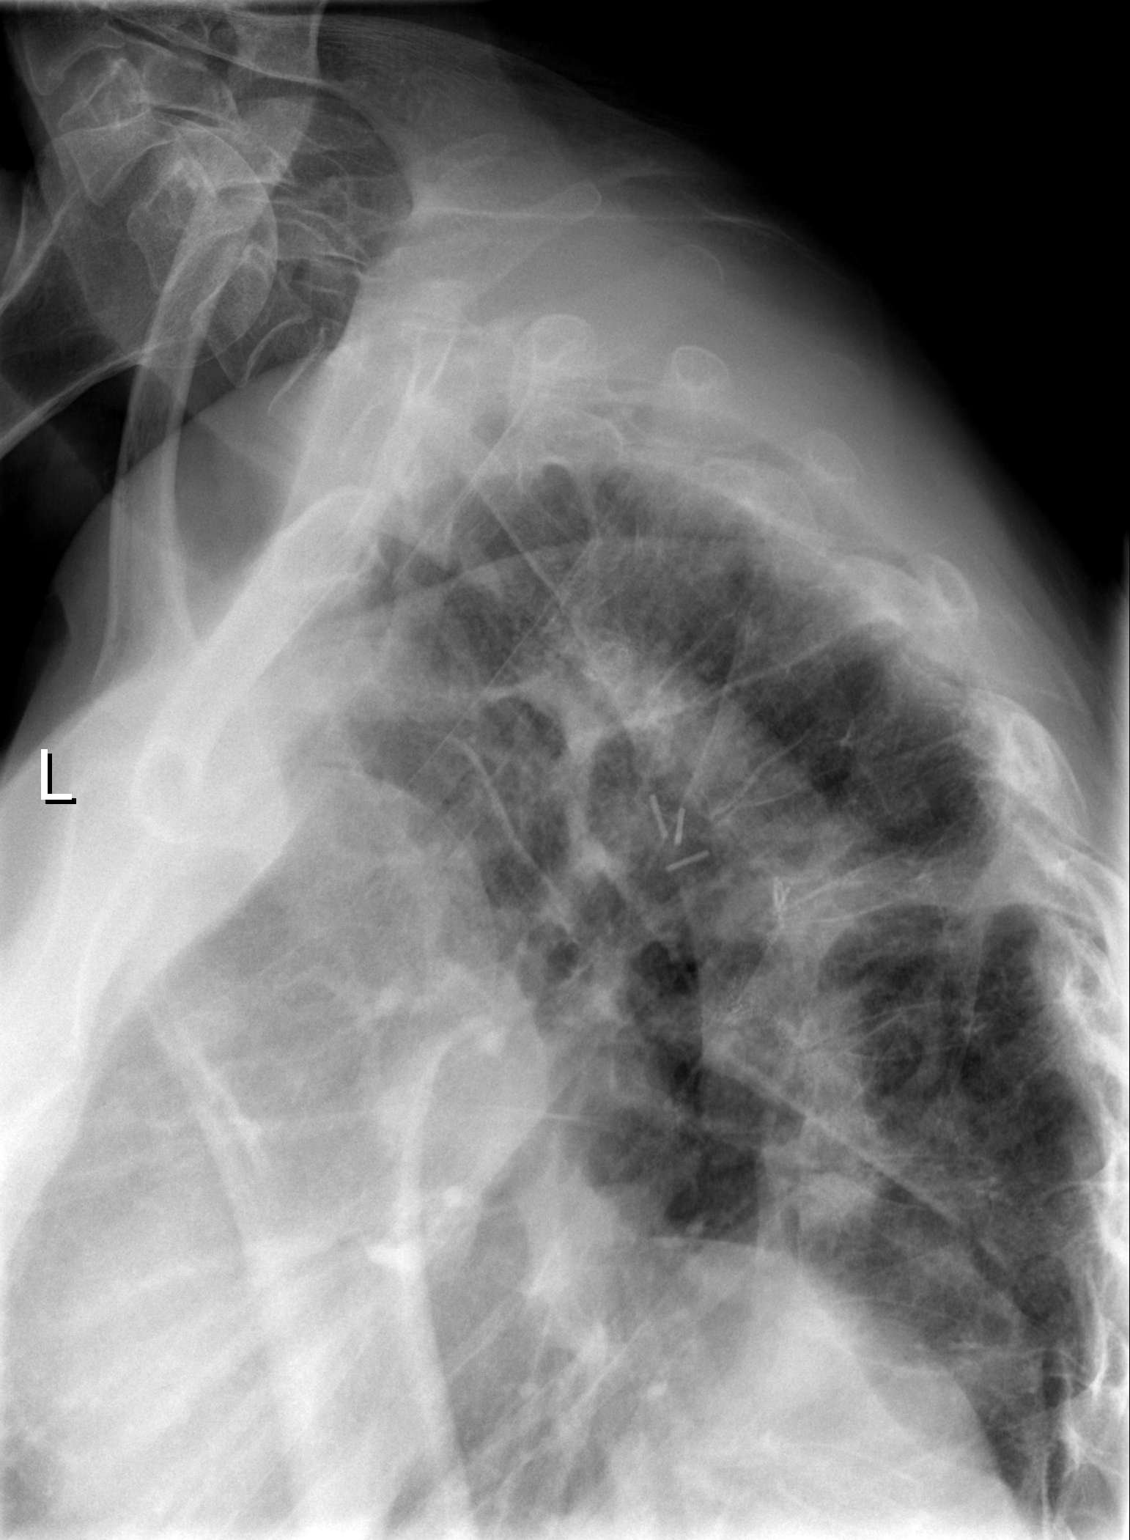

[3 of 3 positions shown; findings below may reference images not displayed]

FINDINGS: No change in mid dorsal degenerative disc disease and
minimal kyphosis.  No significant osseous lesion or compression
fractures seen with normal vertebral alignment.  Slight diffuse
osteopenia visualized.  Unchanged left thoracotomy postoperative
findings.
IMPRESSION: 1.  Diffuse osteopenia with slight mid dorsal kyphosis and
degenerative changes.
2.  No acute or metastatic disease visualized.

## 2011-01-14 IMAGING — CR DG LUMBAR SPINE 2-3V
3 series · 3 of 3 positions shown · non-contrast
Comparison: [HOSPITAL] the abdominal pelvic CT
11/24/2008.

CLINICAL DATA: Upper and low back pain with osteo perosis and lung
cancer.

LUMBAR SPINE - 2-3 VIEW

[t l-spine a.p.]
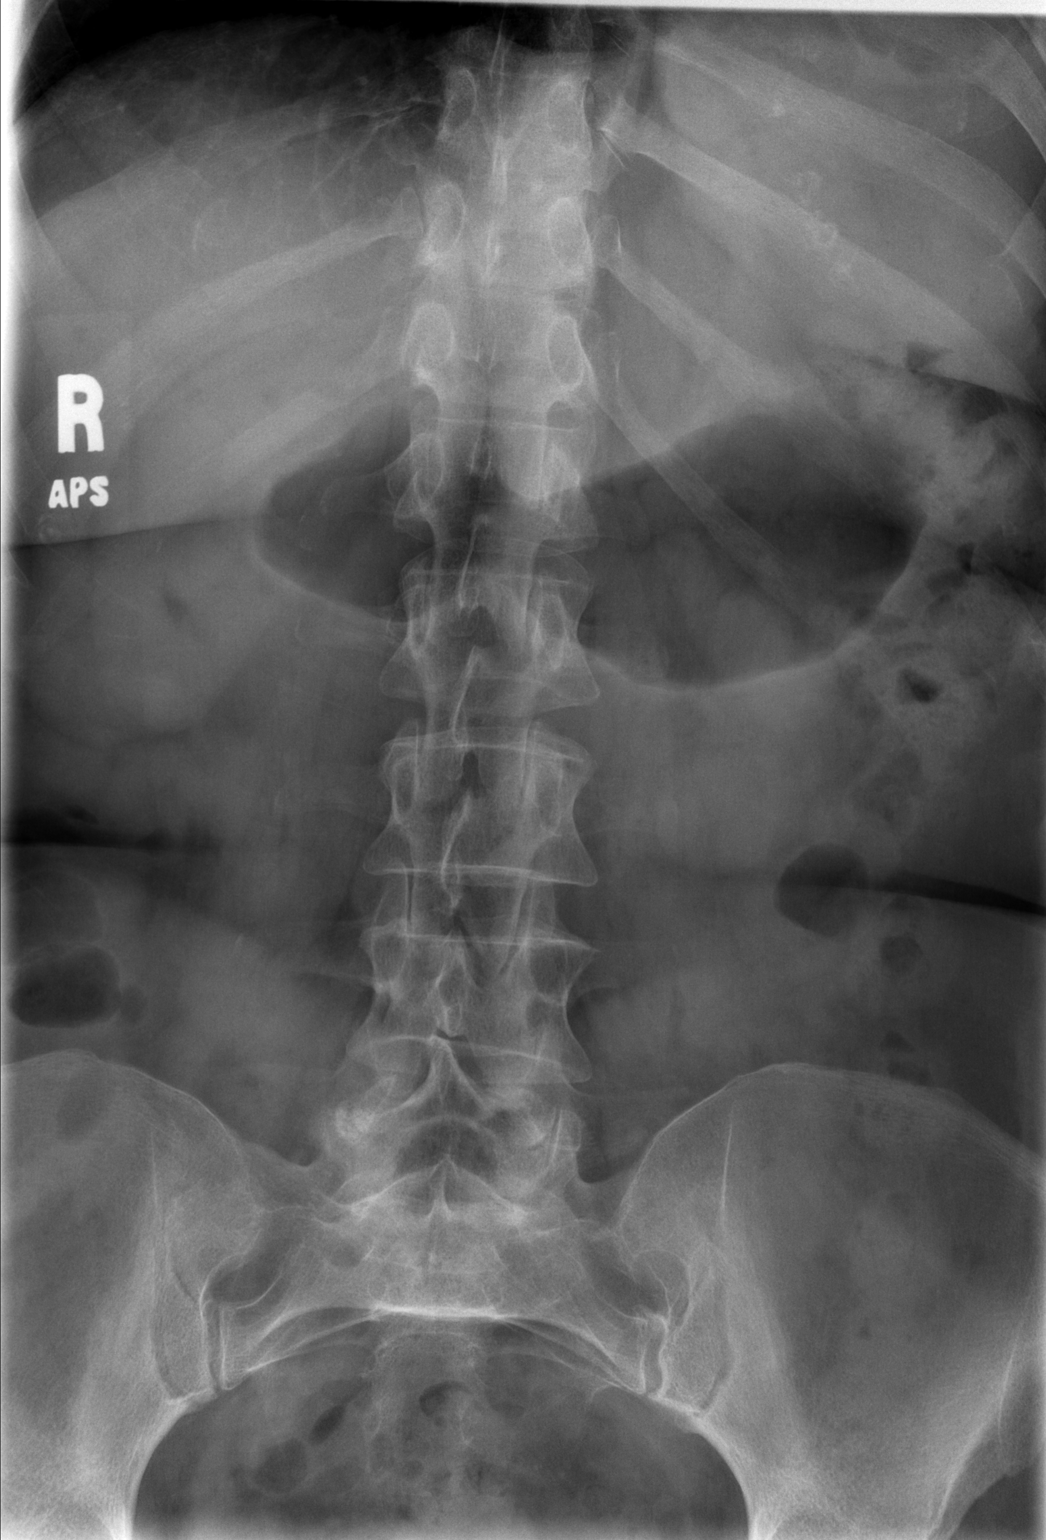

[t l-spine lat]
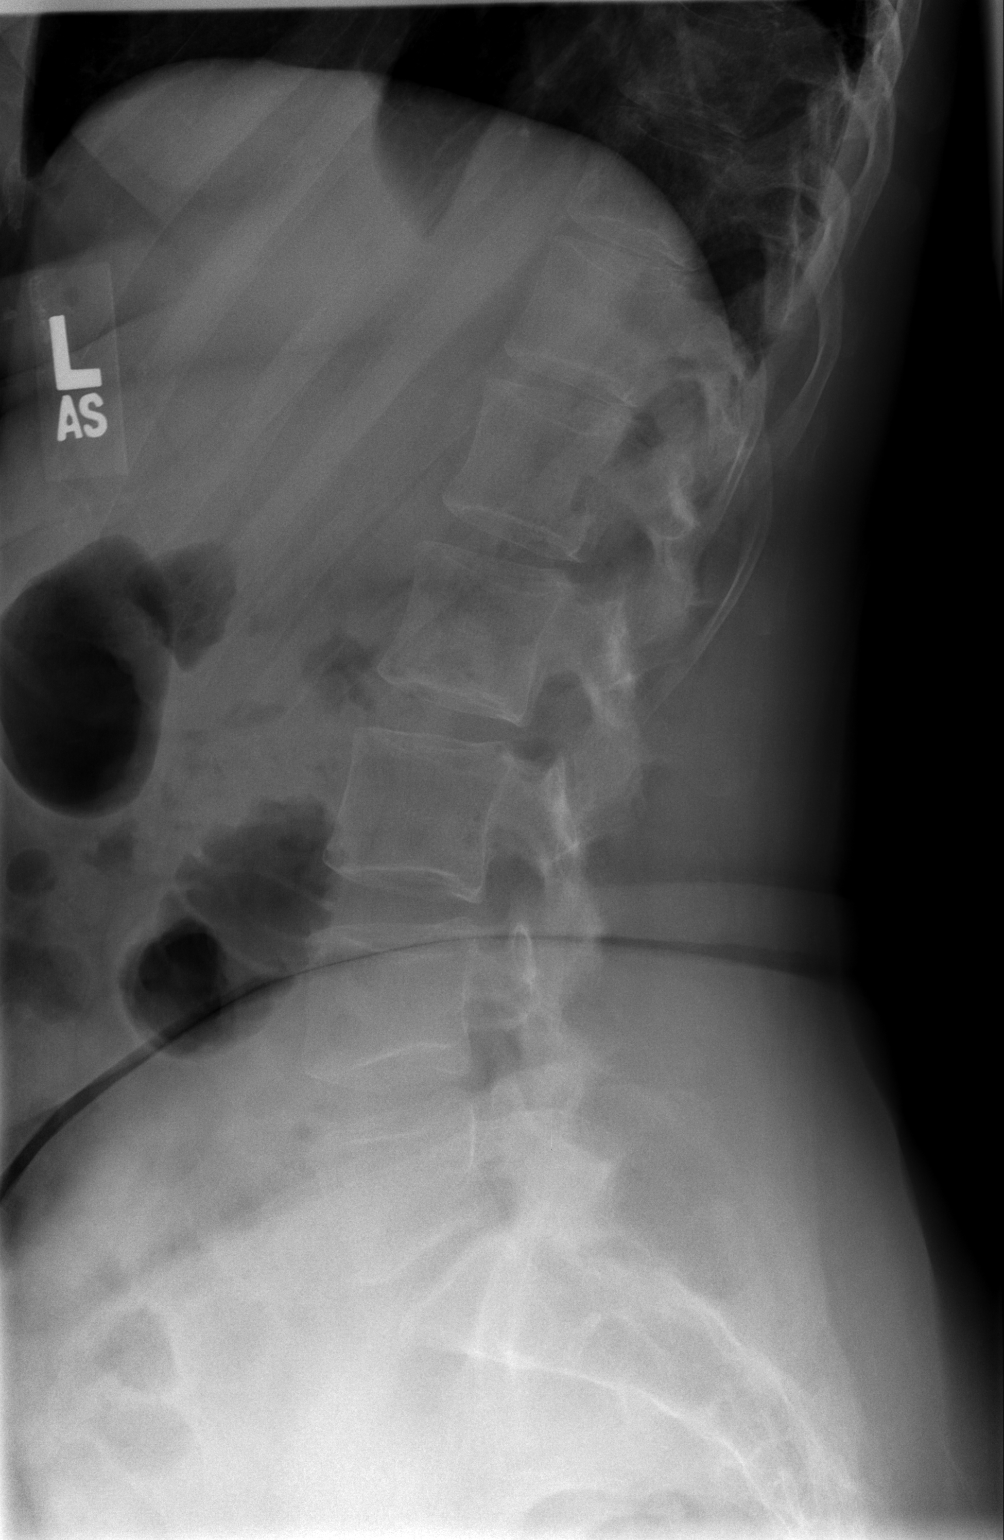

[t l-spine l5-s1 spot]
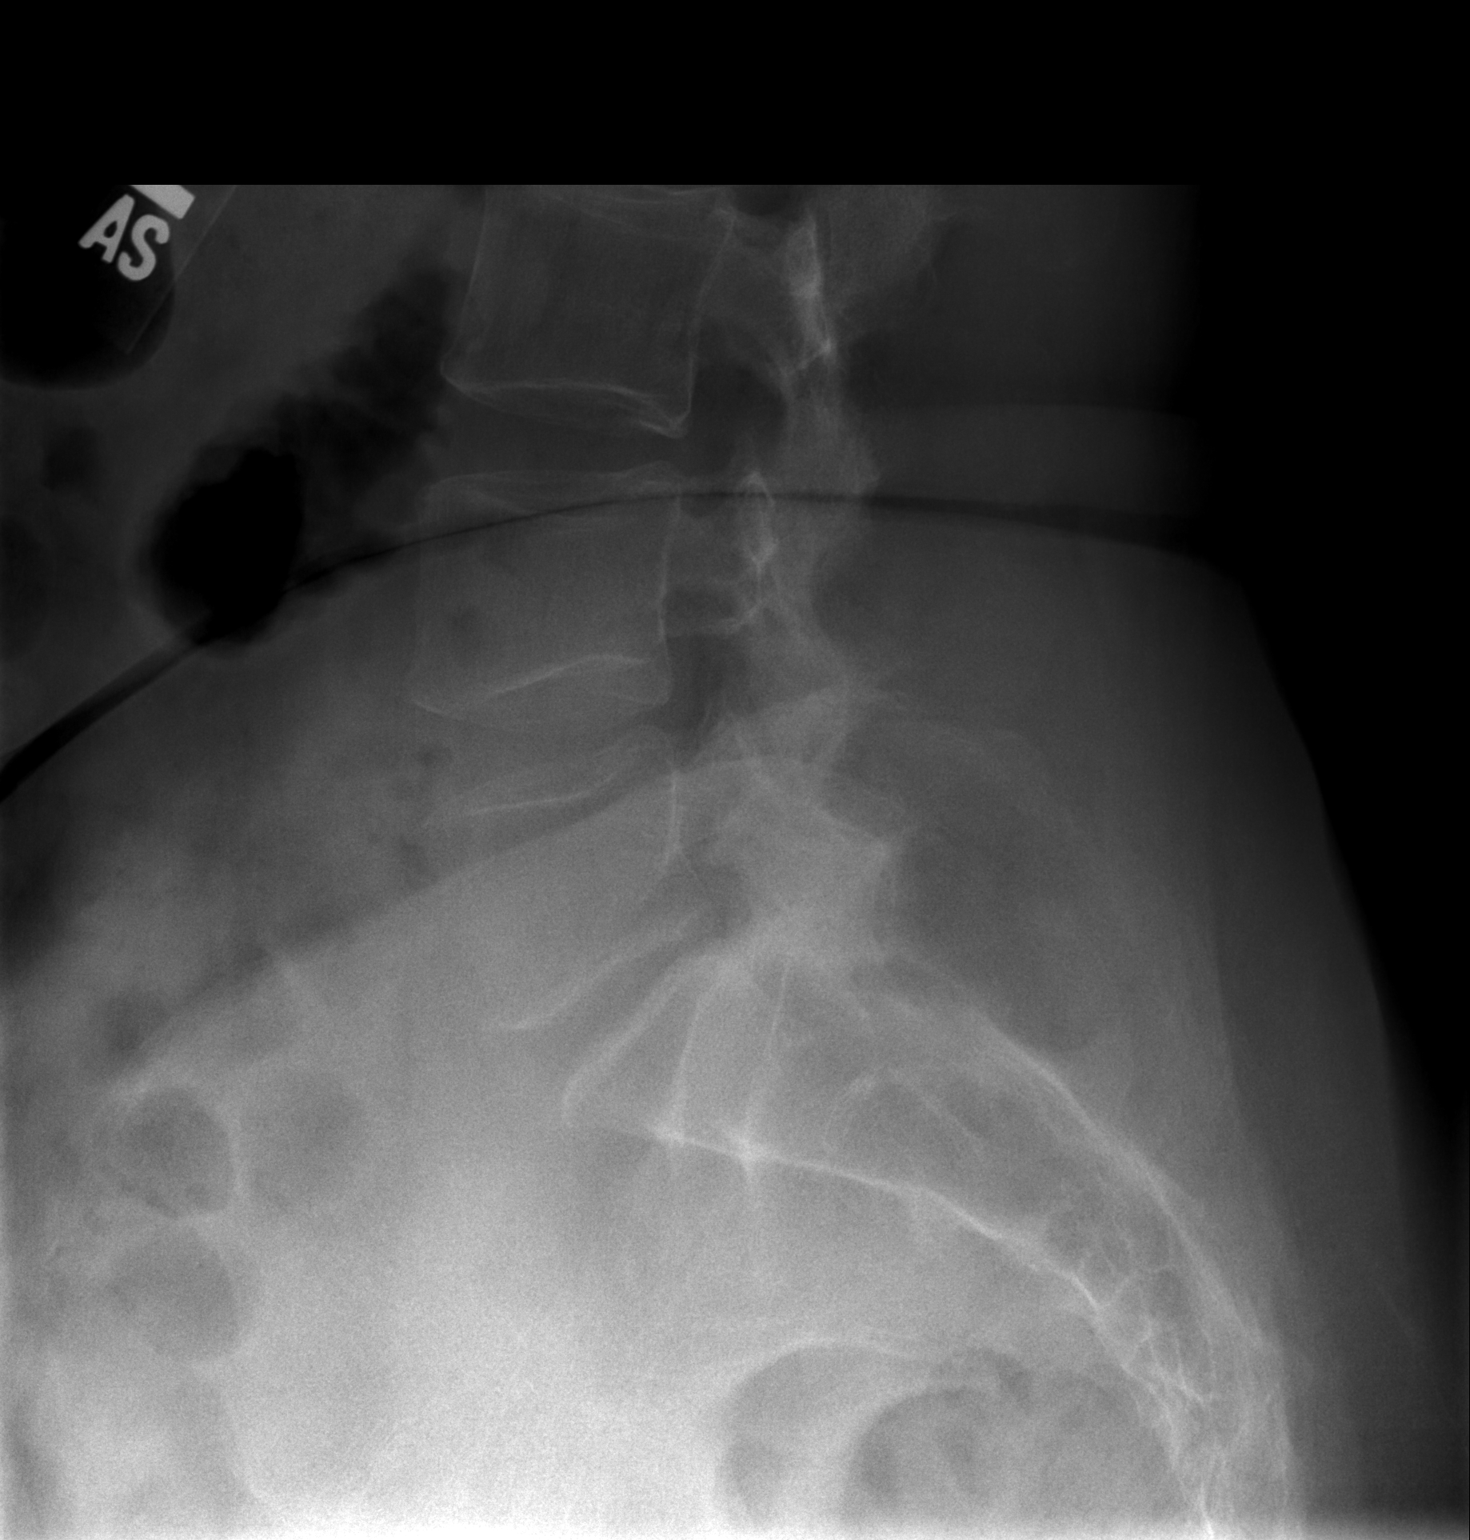

[3 of 3 positions shown; findings below may reference images not displayed]

FINDINGS: Stable slight degenerative disc space narrowing is seen
at L5-S1.  Remaining lumbar disc spaces are maintained with slight
diffuse osteopenia.  No osseous lesion or compression fractures
seen.  Remainder of study is unremarkable.
IMPRESSION: 1.  Slight degenerative disc disease L5-S1.
2.  No acute or metastatic disease.

## 2011-02-20 IMAGING — CT CT CHEST W/ CM
2 of 5 series · 17 of 46 positions shown, 19 images · IV contrast (agent unspecified)
Comparison: 11/24/2008 and 10/30/2006

CT CHEST

CLINICAL DATA: Follow-up lung cancer.  Chemotherapy in progress.
Patient with left lower lobectomy.

CT CHEST, ABDOMEN AND PELVIS WITH CONTRAST
TECHNIQUE: Multidetector CT imaging of the chest, abdomen and
pelvis was performed following the standard protocol during bolus
administration of intravenous contrast.
Contrast: 100 ml intravenous 2mnipaque-COO

[Series 2: cap with st · axial · 0.82mm/px · z∈[-604,-38]mm · 14 of 129 slices shown, 16 images]
[im 8/129  soft-tissue]
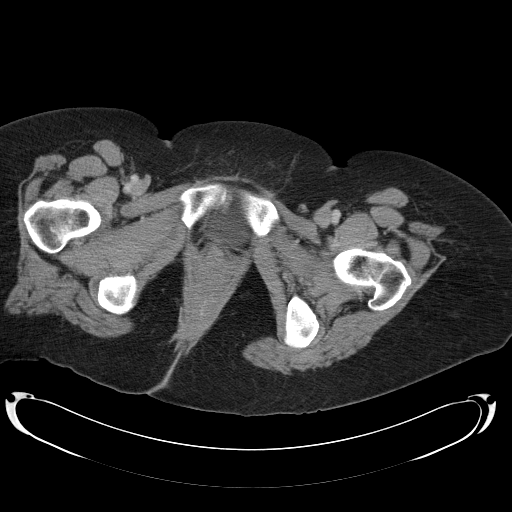
[im 8/129  bone]
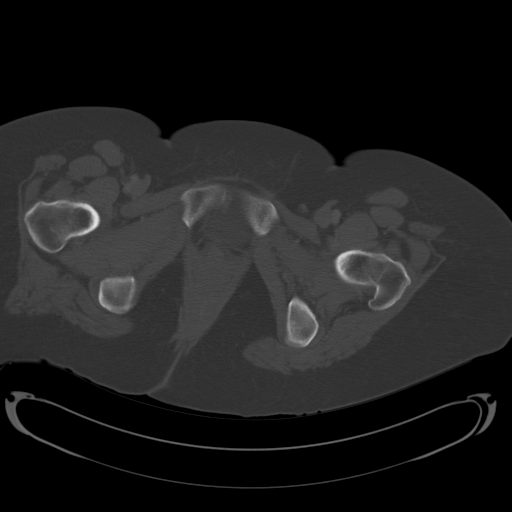
[im 15/129  soft-tissue]
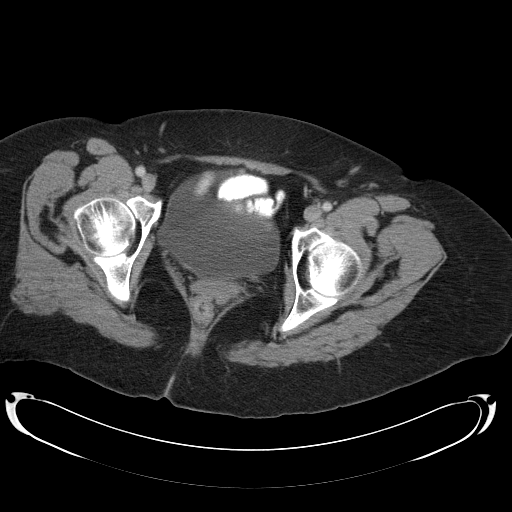
[im 29/129  soft-tissue]
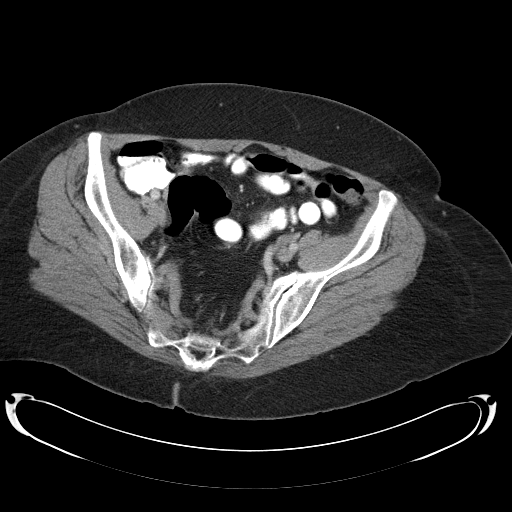
[im 36/129  soft-tissue]
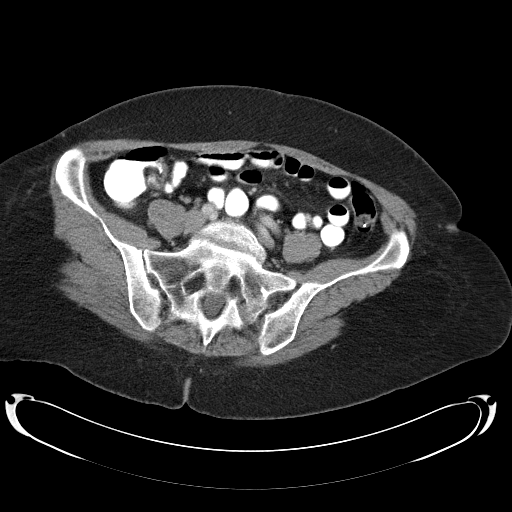
[im 43/129  soft-tissue]
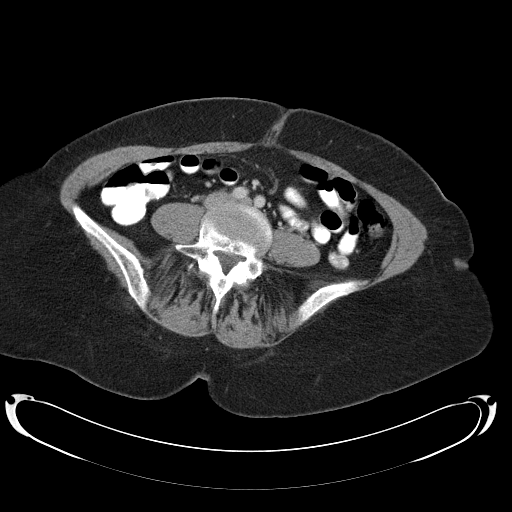
[im 50/129  soft-tissue]
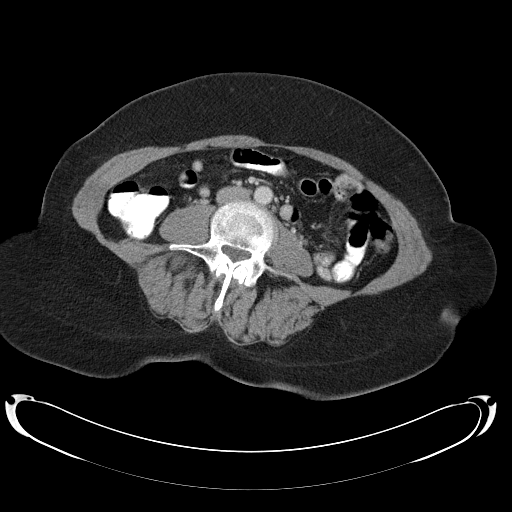
[im 57/129  soft-tissue]
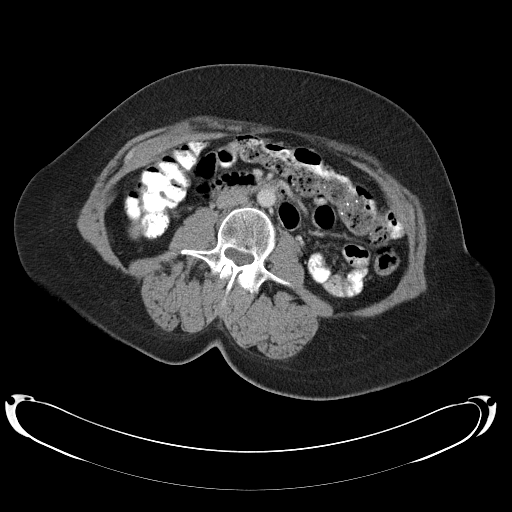
[im 72/129  soft-tissue]
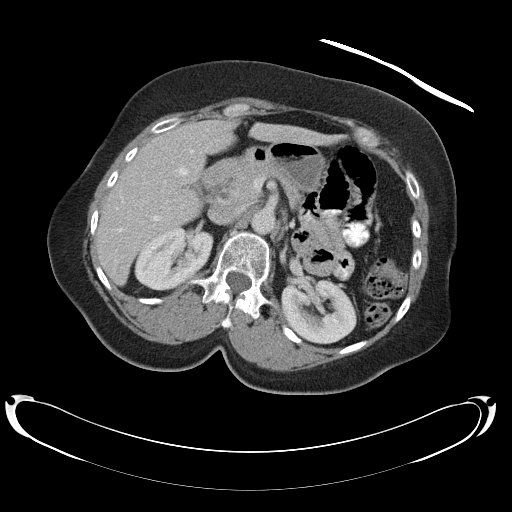
[im 79/129  soft-tissue]
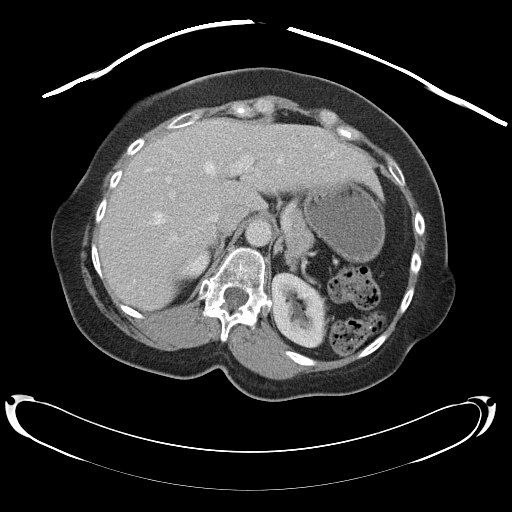
[im 79/129  bone]
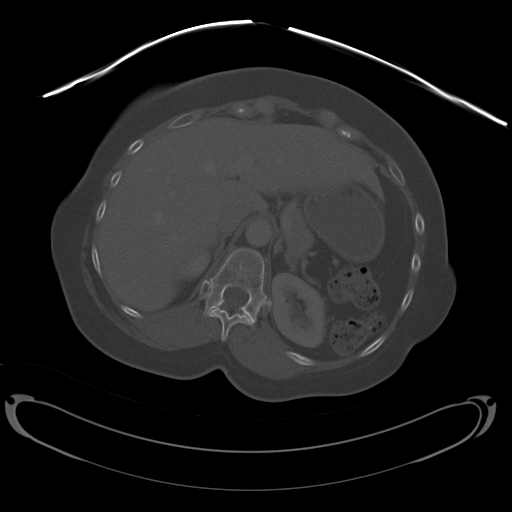
[im 86/129  soft-tissue]
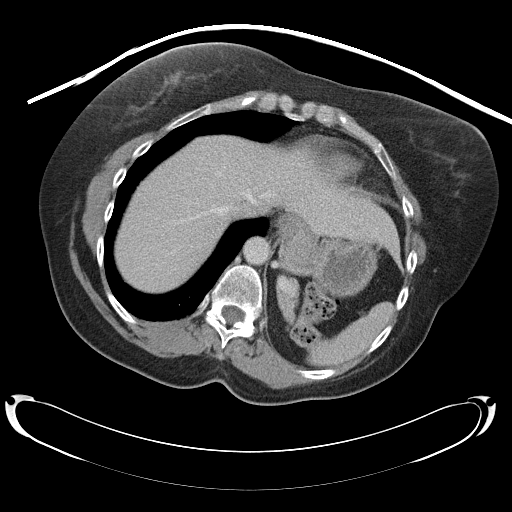
[im 93/129  soft-tissue]
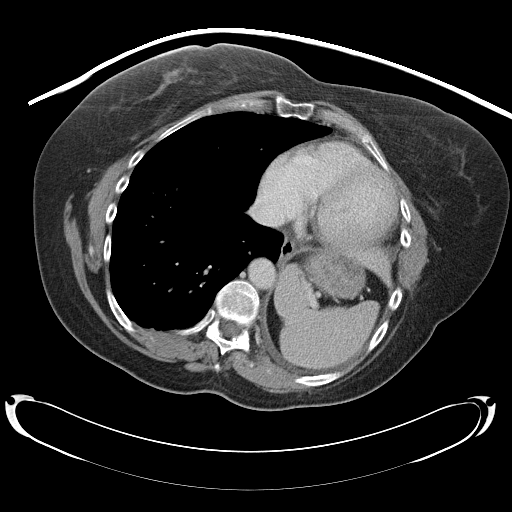
[im 100/129  soft-tissue]
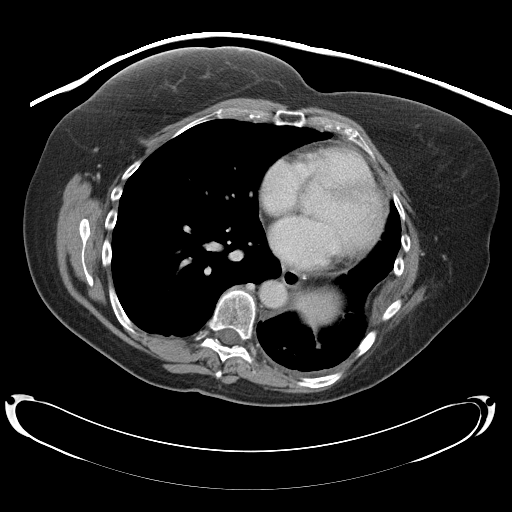
[im 114/129  soft-tissue]
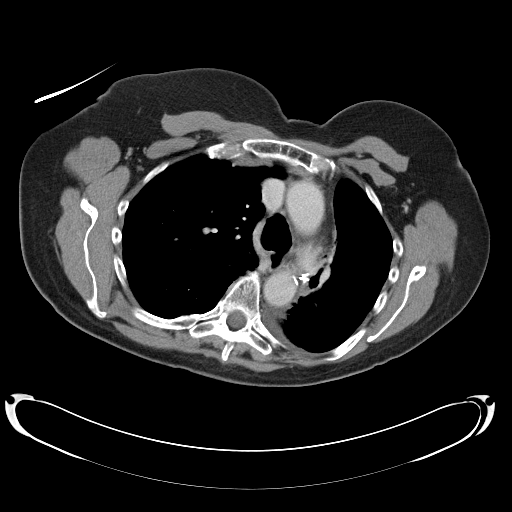
[im 121/129  soft-tissue]
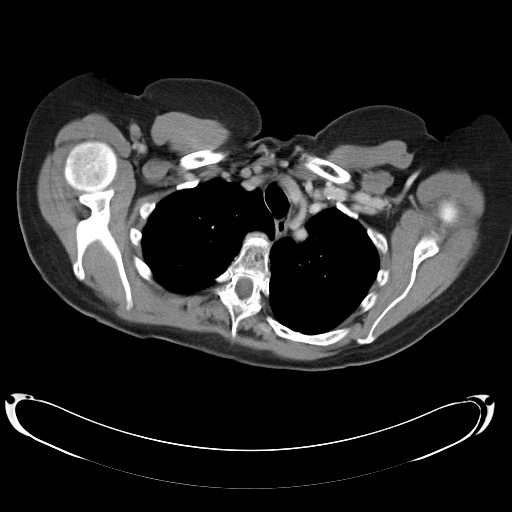

[Series 602: <mpr thick range> · coronal · 1.26mm/px · 3 of 78 slices shown]
[im 26/78  soft-tissue]
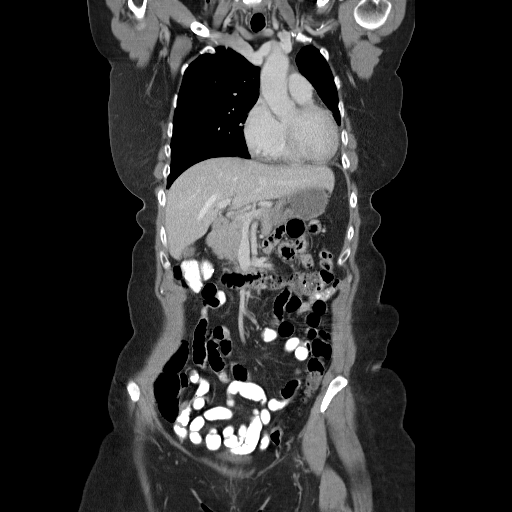
[im 35/78  soft-tissue]
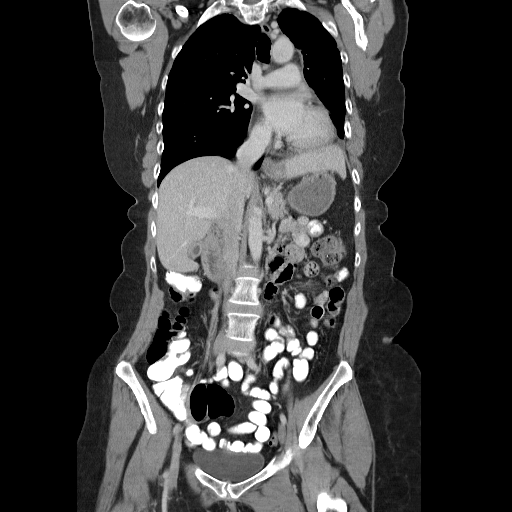
[im 43/78  soft-tissue]
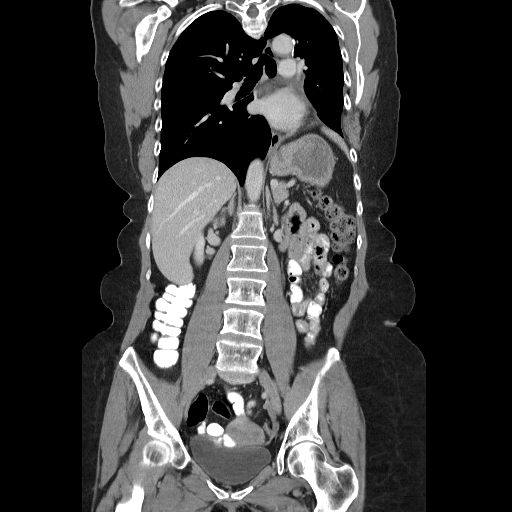

[17 of 46 positions shown; findings below may reference images not displayed]

FINDINGS: Left lower lobectomy again identified with postoperative
changes.
The heart and great vessels are within normal limits otherwise.
A tiny loculated medial left pleural effusion is unchanged.
There is no evidence of pericardial effusion or right pleural
effusion.
No enlarged lymph nodes are identified.

A stable 4 mm right upper lobe nodule (image 18) is unchanged from
7992 and benign.
No new pulmonary opacities are present.
Left lung atelectasis/scarring is stable.

No acute or suspicious bony lesions are identified. Degenerative
changes within the anterior mid thoracic spine again noted.
IMPRESSION: Stable chest CT without evidence of acute abnormality or metastatic
disease.

CT ABDOMEN
FINDINGS: The liver, gallbladder, spleen, adrenal glands,
pancreas, and kidneys are unremarkable.
No free fluid, enlarged lymph nodes, biliary dilation or abdominal
aortic aneurysm identified.
The visualized bowel is unremarkable.
No acute or suspicious bony abnormalities are identified.
IMPRESSION: Stable abdomen CT without evidence of acute abnormality or
metastatic disease.

CT PELVIS
FINDINGS: The bladder and bowel are unremarkable.
No evidence of free fluid, enlarged lymph nodes, or urinary calculi
noted within the pelvis.
No acute or suspicious bony abnormalities are identified.
IMPRESSION: Stable pelvic CT without evidence of acute abnormality or
metastatic disease.

## 2011-04-09 ENCOUNTER — Other Ambulatory Visit (HOSPITAL_COMMUNITY): Payer: Self-pay | Admitting: Obstetrics and Gynecology

## 2011-04-09 ENCOUNTER — Other Ambulatory Visit: Payer: Self-pay | Admitting: Obstetrics and Gynecology

## 2011-04-09 DIAGNOSIS — Z1231 Encounter for screening mammogram for malignant neoplasm of breast: Secondary | ICD-10-CM

## 2011-04-20 ENCOUNTER — Ambulatory Visit
Admission: RE | Admit: 2011-04-20 | Discharge: 2011-04-20 | Disposition: A | Discharge status: Home / Self Care | Payer: Medicare Other | Source: Ambulatory Visit | Attending: Obstetrics and Gynecology | Admitting: Obstetrics and Gynecology

## 2011-04-20 DIAGNOSIS — Z1231 Encounter for screening mammogram for malignant neoplasm of breast: Secondary | ICD-10-CM

## 2011-05-03 ENCOUNTER — Other Ambulatory Visit: Payer: Self-pay | Admitting: Internal Medicine

## 2011-05-03 DIAGNOSIS — C3490 Malignant neoplasm of unspecified part of unspecified bronchus or lung: Secondary | ICD-10-CM

## 2011-05-17 ENCOUNTER — Other Ambulatory Visit: Payer: Self-pay | Admitting: Internal Medicine

## 2011-05-17 ENCOUNTER — Encounter (HOSPITAL_COMMUNITY): Payer: Self-pay

## 2011-05-17 ENCOUNTER — Encounter (HOSPITAL_BASED_OUTPATIENT_CLINIC_OR_DEPARTMENT_OTHER): Payer: Medicare Other | Admitting: Internal Medicine

## 2011-05-17 ENCOUNTER — Ambulatory Visit (HOSPITAL_COMMUNITY)
Admission: RE | Admit: 2011-05-17 | Discharge: 2011-05-17 | Disposition: A | Discharge status: Home / Self Care | Payer: Medicare Other | Source: Ambulatory Visit | Attending: Internal Medicine | Admitting: Internal Medicine

## 2011-05-17 DIAGNOSIS — Z85118 Personal history of other malignant neoplasm of bronchus and lung: Secondary | ICD-10-CM | POA: Insufficient documentation

## 2011-05-17 DIAGNOSIS — C7949 Secondary malignant neoplasm of other parts of nervous system: Secondary | ICD-10-CM

## 2011-05-17 DIAGNOSIS — D259 Leiomyoma of uterus, unspecified: Secondary | ICD-10-CM | POA: Insufficient documentation

## 2011-05-17 DIAGNOSIS — C7931 Secondary malignant neoplasm of brain: Secondary | ICD-10-CM

## 2011-05-17 DIAGNOSIS — C3490 Malignant neoplasm of unspecified part of unspecified bronchus or lung: Secondary | ICD-10-CM

## 2011-05-17 DIAGNOSIS — Z09 Encounter for follow-up examination after completed treatment for conditions other than malignant neoplasm: Secondary | ICD-10-CM | POA: Insufficient documentation

## 2011-05-17 DIAGNOSIS — C3430 Malignant neoplasm of lower lobe, unspecified bronchus or lung: Secondary | ICD-10-CM

## 2011-05-17 DIAGNOSIS — Z85841 Personal history of malignant neoplasm of brain: Secondary | ICD-10-CM | POA: Insufficient documentation

## 2011-05-17 LAB — CBC WITH DIFFERENTIAL/PLATELET
BASO%: 0.5 % (ref 0.0–2.0)
Basophils Absolute: 0 10*3/uL (ref 0.0–0.1)
EOS%: 4.4 % (ref 0.0–7.0)
Eosinophils Absolute: 0.2 10*3/uL (ref 0.0–0.5)
HCT: 39.2 % (ref 34.8–46.6)
HGB: 13.3 g/dL (ref 11.6–15.9)
LYMPH%: 19.3 % (ref 14.0–49.7)
MCH: 31.2 pg (ref 25.1–34.0)
MCHC: 34 g/dL (ref 31.5–36.0)
MCV: 91.8 fL (ref 79.5–101.0)
MONO#: 0.4 10*3/uL (ref 0.1–0.9)
MONO%: 9.4 % (ref 0.0–14.0)
NEUT#: 3 10*3/uL (ref 1.5–6.5)
NEUT%: 66.4 % (ref 38.4–76.8)
Platelets: 221 10*3/uL (ref 145–400)
RBC: 4.27 10*6/uL (ref 3.70–5.45)
RDW: 13.9 % (ref 11.2–14.5)
WBC: 4.6 10*3/uL (ref 3.9–10.3)
lymph#: 0.9 10*3/uL (ref 0.9–3.3)

## 2011-05-17 LAB — CMP (CANCER CENTER ONLY)
ALT(SGPT): 28 U/L (ref 10–47)
AST: 25 U/L (ref 11–38)
Albumin: 3.8 g/dL (ref 3.3–5.5)
Alkaline Phosphatase: 64 U/L (ref 26–84)
BUN, Bld: 13 mg/dL (ref 7–22)
CO2: 26 mEq/L (ref 18–33)
Calcium: 9.1 mg/dL (ref 8.0–10.3)
Chloride: 101 mEq/L (ref 98–108)
Creat: 0.9 mg/dl (ref 0.6–1.2)
Glucose, Bld: 99 mg/dL (ref 73–118)
Potassium: 3.9 mEq/L (ref 3.3–4.7)
Sodium: 144 mEq/L (ref 128–145)
Total Bilirubin: 1.4 mg/dl (ref 0.20–1.60)
Total Protein: 7 g/dL (ref 6.4–8.1)

## 2011-05-17 MED ORDER — IOHEXOL 300 MG/ML  SOLN
125.0000 mL | Freq: Once | INTRAMUSCULAR | Status: AC | PRN
  Administered 2011-05-17: 125 mL via INTRAVENOUS

## 2011-05-21 ENCOUNTER — Encounter (HOSPITAL_BASED_OUTPATIENT_CLINIC_OR_DEPARTMENT_OTHER): Payer: Medicare Other | Admitting: Internal Medicine

## 2011-05-21 DIAGNOSIS — C7931 Secondary malignant neoplasm of brain: Secondary | ICD-10-CM

## 2011-05-21 DIAGNOSIS — C7949 Secondary malignant neoplasm of other parts of nervous system: Secondary | ICD-10-CM

## 2011-05-21 DIAGNOSIS — Z23 Encounter for immunization: Secondary | ICD-10-CM

## 2011-05-21 DIAGNOSIS — C3430 Malignant neoplasm of lower lobe, unspecified bronchus or lung: Secondary | ICD-10-CM

## 2011-05-26 IMAGING — CT CT PELVIS W/ CM
2 of 5 series · 16 of 46 positions shown, 18 images · IV contrast (agent unspecified)
Comparison: 02/24/2009 and earlier.

CT CHEST

CLINICAL DATA: 62-year-old female with history lung cancer status
post left lower lobectomy.  Chemotherapy ongoing. The patient
denies acute complaints.

CT CHEST, ABDOMEN AND PELVIS WITH CONTRAST
TECHNIQUE: Multidetector CT imaging of the chest, abdomen and
pelvis was performed following the standard protocol during bolus
administration of intravenous contrast.
Contrast: 100 ml 5mnipaque-7UU.

[Series 2: cap with st · axial · 0.86mm/px · z∈[-664,-39]mm · 13 of 141 slices shown, 15 images]
[im 8/141  soft-tissue]
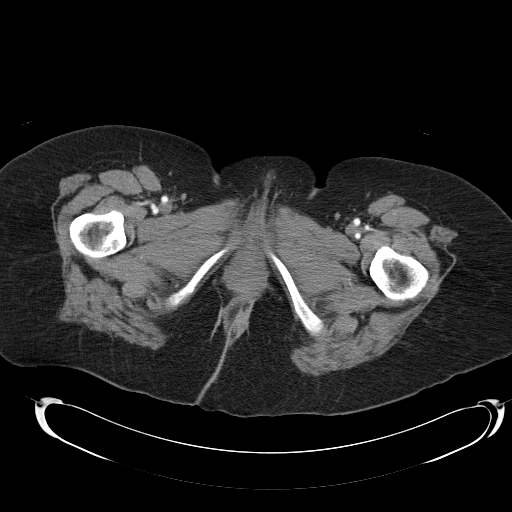
[im 8/141  bone]
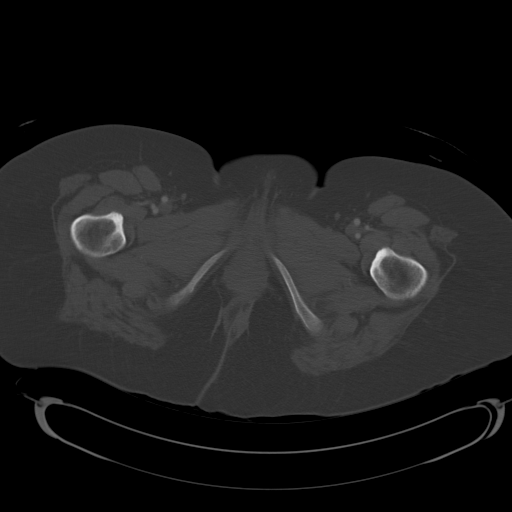
[im 16/141  soft-tissue]
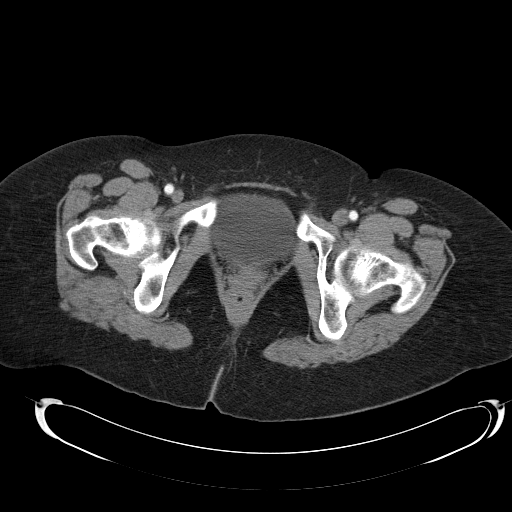
[im 32/141  soft-tissue]
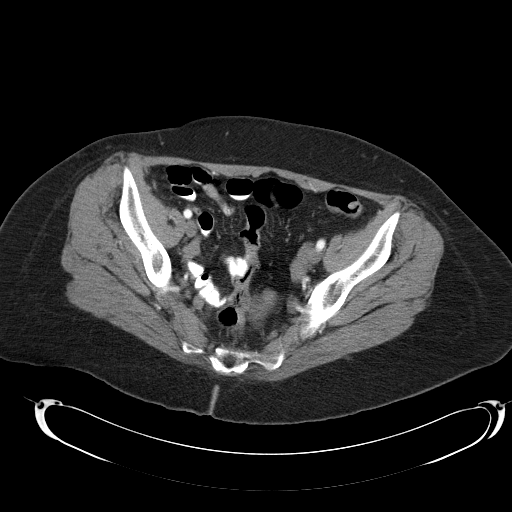
[im 39/141  soft-tissue]
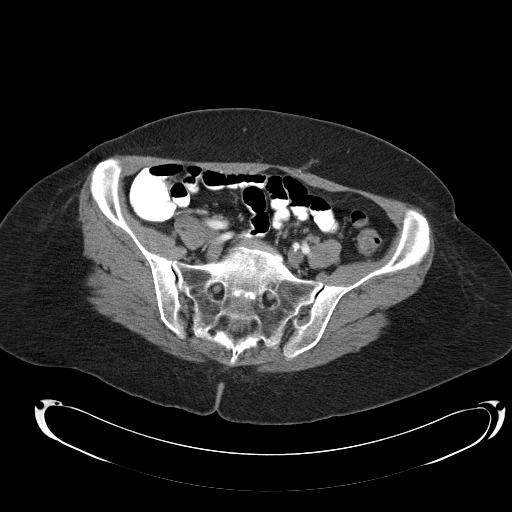
[im 47/141  soft-tissue]
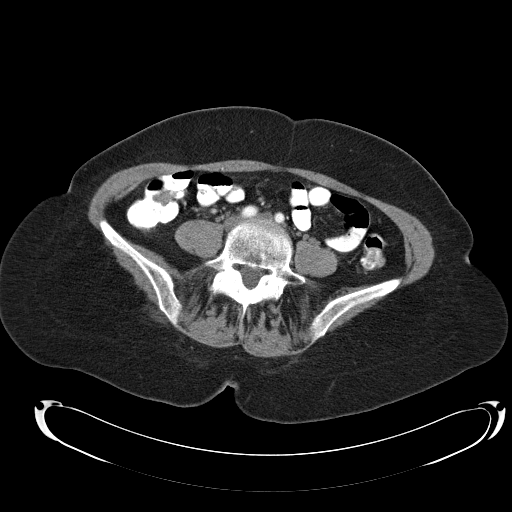
[im 63/141  soft-tissue]
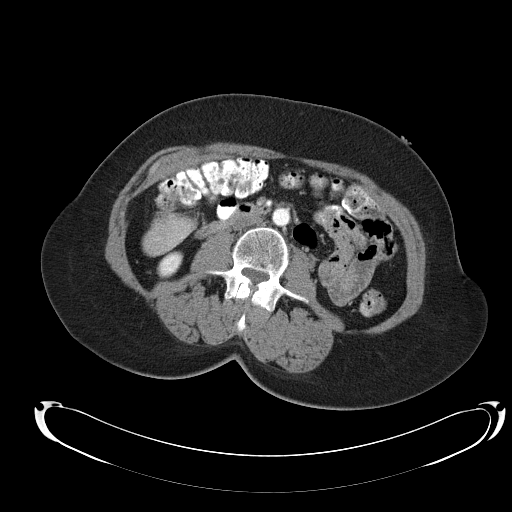
[im 71/141  soft-tissue]
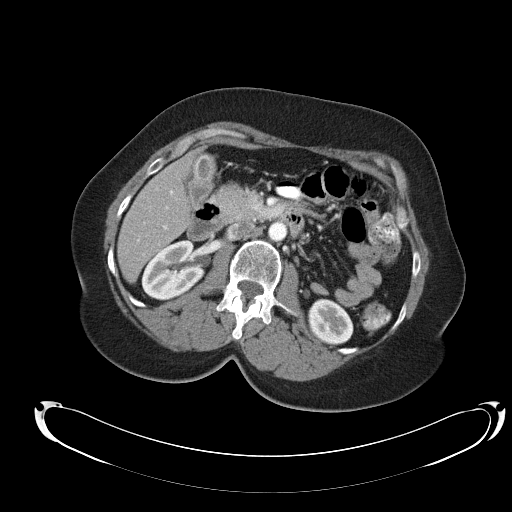
[im 78/141  soft-tissue]
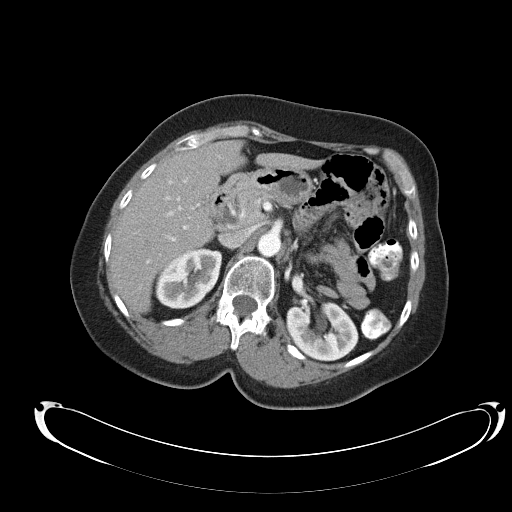
[im 94/141  soft-tissue]
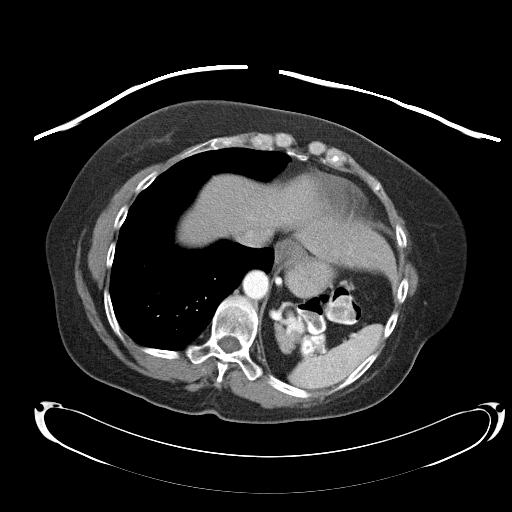
[im 94/141  bone]
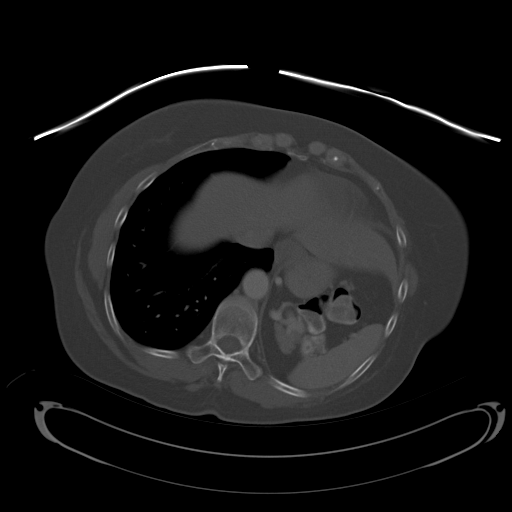
[im 102/141  soft-tissue]
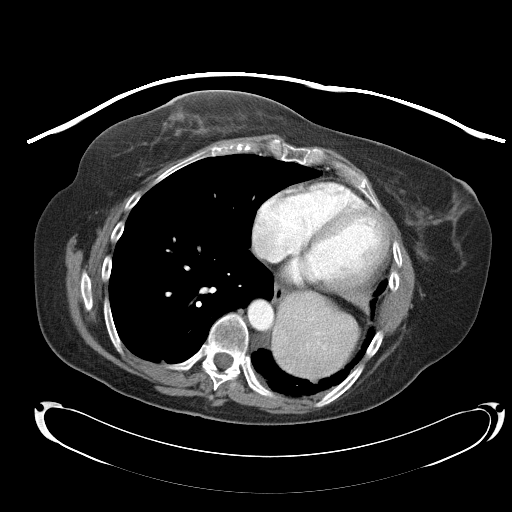
[im 109/141  soft-tissue]
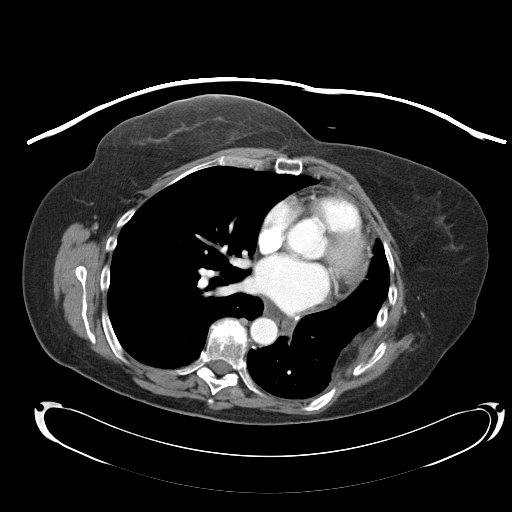
[im 125/141  soft-tissue]
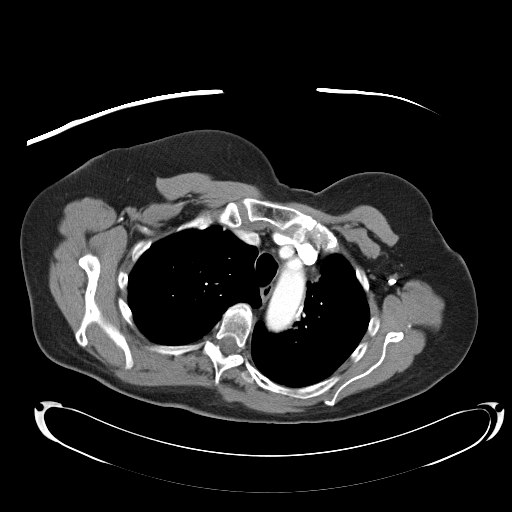
[im 133/141  soft-tissue]
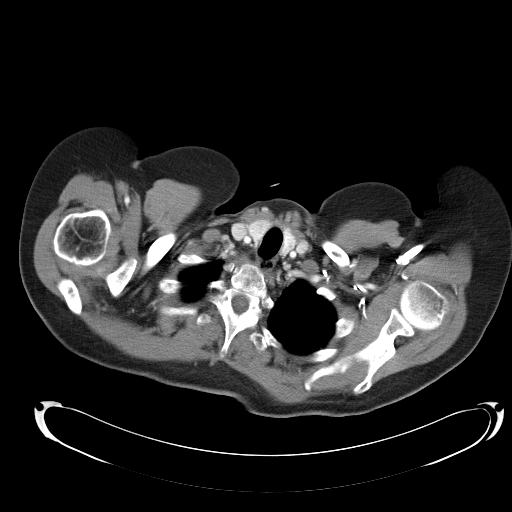

[Series 602: <mpr thick range> · coronal · 1.37mm/px · 3 of 104 slices shown]
[im 35/104  soft-tissue]
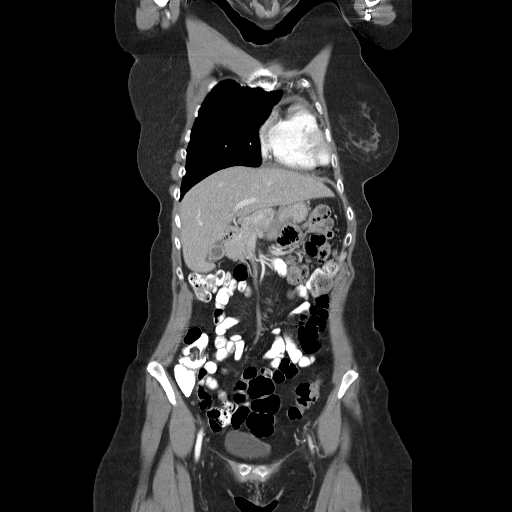
[im 46/104  soft-tissue]
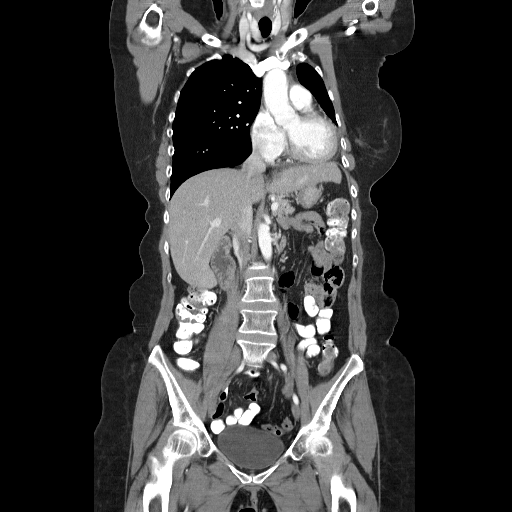
[im 58/104  soft-tissue]
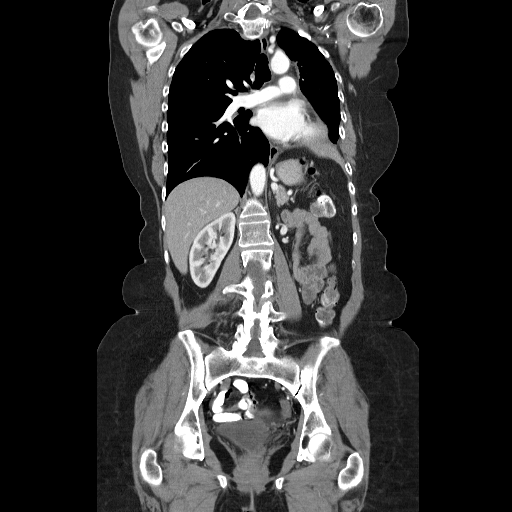

[16 of 46 positions shown; findings below may reference images not displayed]

FINDINGS: Stable volume reduction in the left hemithorax status
post  partial pneumonectomy.  Stable residual left lung parenchyma.
Mild changes of radiation pneumonitis are stable.  4 mm subpleural
nodule the right upper lobe is not significantly changed.  Small
areas of subpleural nodularity in the right lung are stable.  No
new or suspicious right lung mass. Stable visualized osseous
structures.  Stable trace left pleural effusion and trace left
pericardial leak that the stable trace left pleural effusion and
trace pericardial effusion.  No hilar or mediastinal
lymphadenopathy.  Partially calcified right thyroid lobe nodule is
stable since 5115 compatible with benign etiology. Stable
visualized osseous structures.
IMPRESSION: Stable postoperative appearance of the chest.  No acute or
metastatic findings.

CT ABDOMEN
FINDINGS: There is hyperenhancement of the gallbladder wall which
is new.  The gallbladder is slightly more decompressed, and there
is trace pericholecystic fluid suggested.  Liver parenchyma is
stable and within normal limits aside from steatosis.  The spleen,
pancreas, adrenal glands, and kidneys are within normal limits.
Superior mesenteric vein is not well opacified limiting its
evaluation.  Major abdominal arterial structures and other
components of the portal venous system appear within normal limits.
No free fluid or lymphadenopathy.  Stomach, visualized small bowel
loops, appendix, and colon are within normal limits.  Stable
visualized osseous structures.
IMPRESSION: 1.  Trace pericholecystic fluid and hyperenhancement of the
gallbladder wall. Perhaps this is the sequelae of chemotherapy
given the lack of patient abdominal complaints which makes acute
cholecystitis or other acute cholangiopathy unlikely. Clinical
correlation recommended. I discussed this with Dr. Anji at 8676
hours on 05/30/2009.
2.  Otherwise stable abdomen, without evidence of metastatic
disease.

CT PELVIS
FINDINGS: Major pelvic arterial structures are patent.  No pelvic
free fluid.  Bladder within normal limits.  Stable uterine fibroid
at the posterior fundus.  Adnexa within normal limits.  Visualized
distal large and small bowel loops are within normal limits aside
from redundancy of the sigmoid colon.  No lymphadenopathy. Stable
visualized osseous structures.
IMPRESSION: Stable pelvis, no acute or metastatic findings.

## 2011-06-26 NOTE — Progress Notes (Signed)
This encounter was created in error - please disregard.

## 2011-07-03 ENCOUNTER — Encounter: Payer: Self-pay | Admitting: *Deleted

## 2011-07-03 NOTE — Progress Notes (Signed)
RECEIVED A FAX FROM [facility] CONCERNING A CONFIRMATION OF PRESCRIPTION SHIPMENT. THIS NOTICE WAS GIVEN TO DR.MOHAMED'S NURSE, STEPHANIE JOHNSON,RN.

## 2011-08-03 ENCOUNTER — Encounter: Payer: Self-pay | Admitting: *Deleted

## 2011-08-03 NOTE — Progress Notes (Signed)
RECEIVED A FAX FROM [facility] CONCERNING A CONFIRMATION OF PRESCRIPTION SHIPMENT. 

## 2011-08-09 ENCOUNTER — Other Ambulatory Visit: Payer: Self-pay | Admitting: *Deleted

## 2011-08-09 DIAGNOSIS — C3490 Malignant neoplasm of unspecified part of unspecified bronchus or lung: Secondary | ICD-10-CM

## 2011-08-09 NOTE — Progress Notes (Signed)
Pt called stating that she has a new lump on the bottom on her neck that is tender to touch, she is unable to roll it around under her skin, and it is hard.  She wanted to see if she could be seen sooner to have it evaluated.  Per Dr Donnald Garre, okay to put pt on AJ's schedule next week to evaluate lump and will then determine if pt needs to have scans done early.  Onc tx schedule filled out, pt aware.  SLJ

## 2011-08-15 ENCOUNTER — Encounter: Payer: Self-pay | Admitting: Physician Assistant

## 2011-08-15 ENCOUNTER — Ambulatory Visit (HOSPITAL_BASED_OUTPATIENT_CLINIC_OR_DEPARTMENT_OTHER): Payer: Medicare Other | Admitting: Physician Assistant

## 2011-08-15 ENCOUNTER — Other Ambulatory Visit (HOSPITAL_BASED_OUTPATIENT_CLINIC_OR_DEPARTMENT_OTHER): Payer: Medicare Other | Admitting: Lab

## 2011-08-15 VITALS — BP 124/77 | HR 97 | Temp 96.6°F | Ht 69.0 in | Wt 210.9 lb

## 2011-08-15 DIAGNOSIS — C3490 Malignant neoplasm of unspecified part of unspecified bronchus or lung: Secondary | ICD-10-CM

## 2011-08-15 DIAGNOSIS — Z79899 Other long term (current) drug therapy: Secondary | ICD-10-CM

## 2011-08-15 DIAGNOSIS — R413 Other amnesia: Secondary | ICD-10-CM

## 2011-08-15 DIAGNOSIS — Z85841 Personal history of malignant neoplasm of brain: Secondary | ICD-10-CM

## 2011-08-15 LAB — CBC WITH DIFFERENTIAL/PLATELET
BASO%: 0.4 % (ref 0.0–2.0)
Basophils Absolute: 0 10*3/uL (ref 0.0–0.1)
EOS%: 2.6 % (ref 0.0–7.0)
Eosinophils Absolute: 0.1 10*3/uL (ref 0.0–0.5)
HCT: 38.3 % (ref 34.8–46.6)
HGB: 12.8 g/dL (ref 11.6–15.9)
LYMPH%: 16.6 % (ref 14.0–49.7)
MCH: 30.4 pg (ref 25.1–34.0)
MCHC: 33.5 g/dL (ref 31.5–36.0)
MCV: 90.9 fL (ref 79.5–101.0)
MONO#: 0.3 10*3/uL (ref 0.1–0.9)
MONO%: 7.9 % (ref 0.0–14.0)
NEUT#: 2.8 10*3/uL (ref 1.5–6.5)
NEUT%: 72.5 % (ref 38.4–76.8)
Platelets: 257 10*3/uL (ref 145–400)
RBC: 4.21 10*6/uL (ref 3.70–5.45)
RDW: 14.2 % (ref 11.2–14.5)
WBC: 3.9 10*3/uL (ref 3.9–10.3)
lymph#: 0.6 10*3/uL — ABNORMAL LOW (ref 0.9–3.3)

## 2011-08-15 LAB — COMPREHENSIVE METABOLIC PANEL
ALT: 18 U/L (ref 0–35)
AST: 17 U/L (ref 0–37)
Albumin: 4 g/dL (ref 3.5–5.2)
Alkaline Phosphatase: 61 U/L (ref 39–117)
BUN: 9 mg/dL (ref 6–23)
CO2: 25 mEq/L (ref 19–32)
Calcium: 9.5 mg/dL (ref 8.4–10.5)
Chloride: 107 mEq/L (ref 96–112)
Creatinine, Ser: 0.78 mg/dL (ref 0.50–1.10)
Glucose, Bld: 98 mg/dL (ref 70–99)
Potassium: 3.6 mEq/L (ref 3.5–5.3)
Sodium: 139 mEq/L (ref 135–145)
Total Bilirubin: 0.7 mg/dL (ref 0.3–1.2)
Total Protein: 6.3 g/dL (ref 6.0–8.3)

## 2011-08-15 NOTE — Progress Notes (Signed)
No images are attached to the encounter. No scans are attached to the encounter. No scans are attached to the encounter. [facility] OFFICE PROGRESS NOTE  Coletta Memos, M D. Candyce Churn. Allyne Gee, M.D.   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 Status post left lower lobectomy under the care of Dr Edwyna Shell in October 2002. 2. Status post course of concurrent chemoradiation with weekly carboplatin and paclitaxel under the care of Dr Truett Perna and Dr Dorna Bloom in early 2003. 3. Status post treatment with Celebrex and Iressa according to the clinical trial at [facility] for a total of 1 year. 4. Status post left occipital craniotomy for tumor resection on September 21, 2005, under the care of Dr Franky Macho. 5. Status post whole brain irradiation under the care of Dr Dorna Bloom, completed November 07, 2005. 6. Status post gamma knife treatment to the resected cavitary recurrence on June 02, 2008, at [facility].  CURRENT THERAPY: Tarceva 150 mg by mouth daily with therapy beginning 06/03/2007.  INTERVAL HISTORY:  Valerie [name] 65 y.o. female returns for a scheduled regular work in office visit for followup of a tender swollen area and the right side of her neck. She states the area "pop up" shortly after Thanksgiving. It was initially more tender than it is now although it remains slightly tender. She thinks that it has decreased slightly in size. She states that prior to Thanksgiving she had a "flu-like" illness not associated with any fever. She denies any other illnesses and any other masses.   MEDICAL HISTORY: Past Medical History  Diagnosis Date  . Lung cancer     lung ca dx 2002    ALLERGIES:   has no known allergies.  MEDICATIONS:  No current outpatient prescriptions on file.    SURGICAL HISTORY: History reviewed. No pertinent past surgical  history.  REVIEW OF SYSTEMS:  A comprehensive review of systems was negative except for: Ears, nose, mouth, throat, and face: positive for tender mass on the right side of the neck   PHYSICAL EXAMINATION: General appearance: alert, cooperative, appears stated age and no distress Head: Normocephalic, without obvious abnormality, atraumatic Neck: no carotid bruit, no JVD and supple, symmetrical, trachea midline Lymph nodes: Supraclavicular adenopathy: approximately 4-5 cm right supraclavicular lypmh node vs mass, slightly tender to touch, Resp: clear to auscultation bilaterally Cardio: regular rate and rhythm, S1, S2 normal, no murmur, click, rub or gallop GI: soft, non-tender; bowel sounds normal; no masses,  no organomegaly Extremities: extremities normal, atraumatic, no cyanosis or edema Neurologic: Alert and oriented X 3, normal strength and tone. Normal symmetric reflexes. Normal coordination and gait  ECOG PERFORMANCE STATUS: 1 - Symptomatic but completely ambulatory  Blood pressure 124/77, pulse 97, temperature 96.6 F (35.9 C), temperature source Oral, height 5\' 9"  (1.753 m), weight 210 lb 14.4 oz (95.664 kg).  LABORATORY DATA: Lab Results  Component Value Date   WBC 3.9 08/15/2011   HGB 12.8 08/15/2011   HCT 38.3 08/15/2011   MCV 90.9 08/15/2011   PLT 257 08/15/2011      Chemistry      Component Value Date/Time   NA 139 08/15/2011 1045   NA 144 05/17/2011 0728   K 3.6 08/15/2011 1045   K 3.9 05/17/2011 0728   CL 107 08/15/2011 1045   CL 101 05/17/2011 0728   CO2 25 08/15/2011 1045   CO2 26 05/17/2011 0728  BUN 9 08/15/2011 1045   BUN 13 05/17/2011 0728   CREATININE 0.78 08/15/2011 1045   CREATININE 0.9 05/17/2011 0728      Component Value Date/Time   CALCIUM 9.5 08/15/2011 1045   CALCIUM 9.1 05/17/2011 0728   ALKPHOS 61 08/15/2011 1045   ALKPHOS 64 05/17/2011 0728   AST 17 08/15/2011 1045   AST 25 05/17/2011 0728   ALT 18 08/15/2011 1045   BILITOT 0.7 08/15/2011  1045   BILITOT 1.40 05/17/2011 0728       RADIOGRAPHIC STUDIES:  No results found.   ASSESSMENT/PLAN: the patient is a very pleasant [age]-year-old African American female with metastatic non-small cell lung cancer adenocarcinoma with a history of brain metastasis. She has been on treatment with Tarceva at 150 mg by mouth daily since October of 2008. Patient was discussed with Dr. Arbutus Ped. We will arrange to have her completely rescan for restaging purposes, to include an MRI of the brain with and without contrast, CT of the neck, chest, abdomen, and pelvis with contrast to reevaluate her disease. The right supraclavicular lymph node/mass is concerning for metastatic disease. Depending on the outcome of the restaging scans a biopsy of this mass may be needed in the future. She'll followup with Dr. Arbutus Ped within a week to 10 days of her restaging scans to discuss the results and further treatment plans.     Conni Slipper, PA-C     All questions were answered. The patient knows to call the clinic with any problems, questions or concerns. We can certainly see the patient much sooner if necessary.

## 2011-08-19 IMAGING — MR MR HEAD WO/W CM
7 of 10 series · 27 of 48 positions shown · IV contrast (Yes)
Comparison: 02/25/2006

CLINICAL DATA: Carcinoma of the lung, history of brain mets.
History of recent visual changes.

MRI HEAD WITHOUT AND WITH CONTRAST
TECHNIQUE: Multiplanar, multiecho pulse sequences of the brain and
surrounding structures were obtained according to standard protocol
without and with intravenous contrast
Contrast: MultiHance 20 ml

[Series 3: T1 · sagittal · 5.0mm · 0.47mm/px · 1 of 24 slices shown]
[im 1/24]
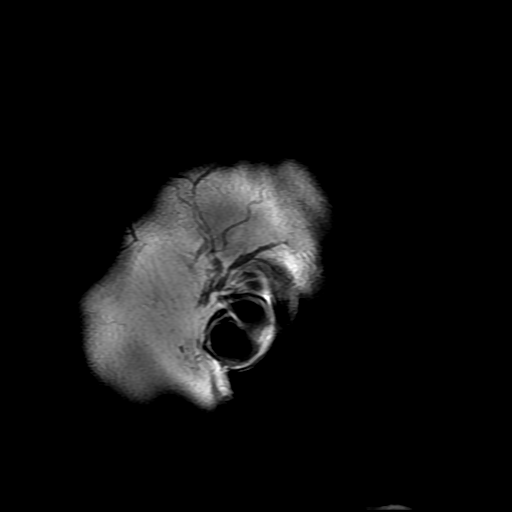

[Series 4: DWI · axial · 5.0mm · 1.09mm/px · z∈[-2,+134]mm · 8 of 60 slices shown (1 of 2)]
[im 1/60]
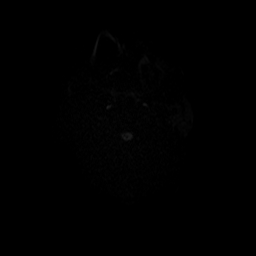
[im 9/60]
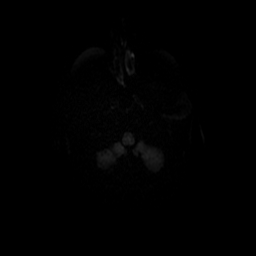
[im 17/60]
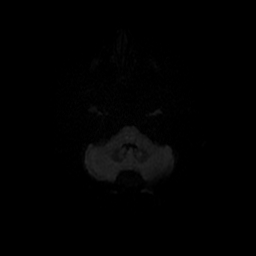
[im 26/60]
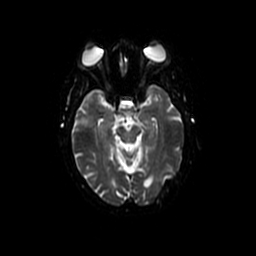
[im 34/60]
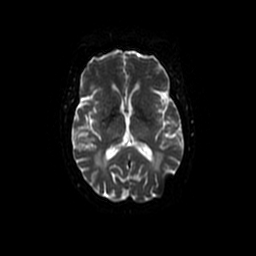
[im 43/60]
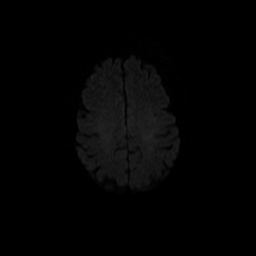
[im 51/60]
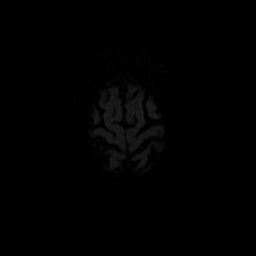
[im 60/60]
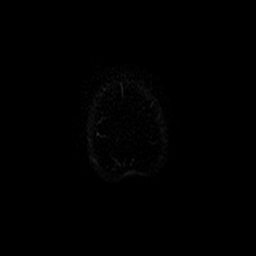

[Series 5: T2 · axial · 5.0mm · 0.43mm/px · z∈[-8,+121]mm · 3 of 22 slices shown]
[im 1/22]
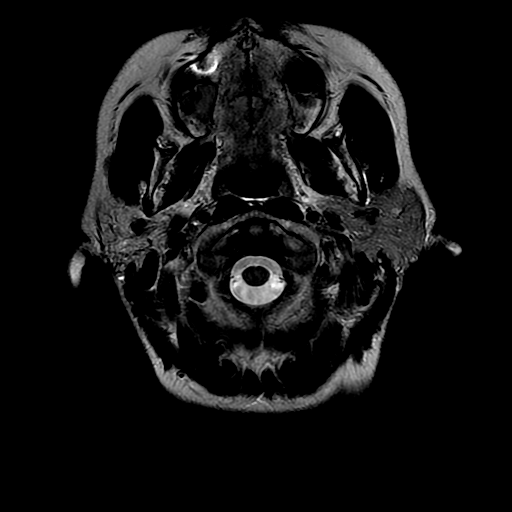
[im 11/22]
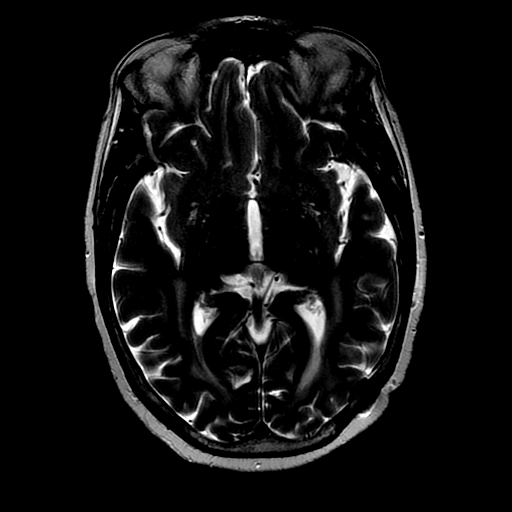
[im 22/22]
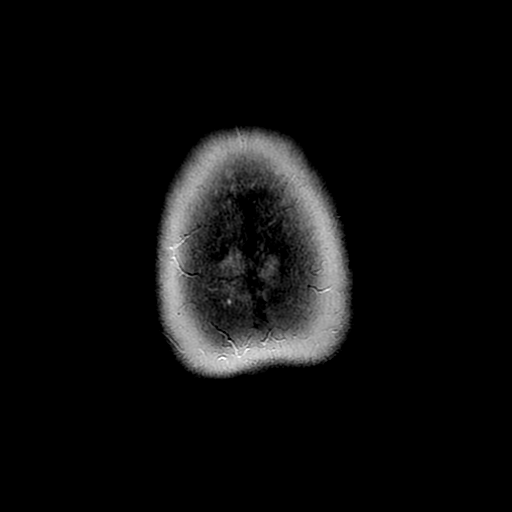

[Series 9: T2 post-contrast · coronal · 5.0mm · 0.45mm/px · 4 of 26 slices shown]
[im 1/26]
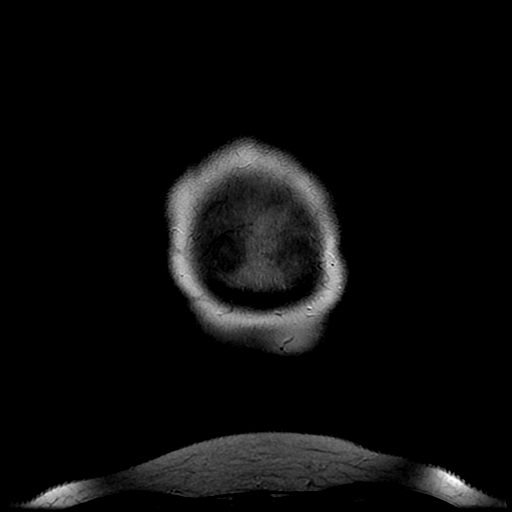
[im 9/26]
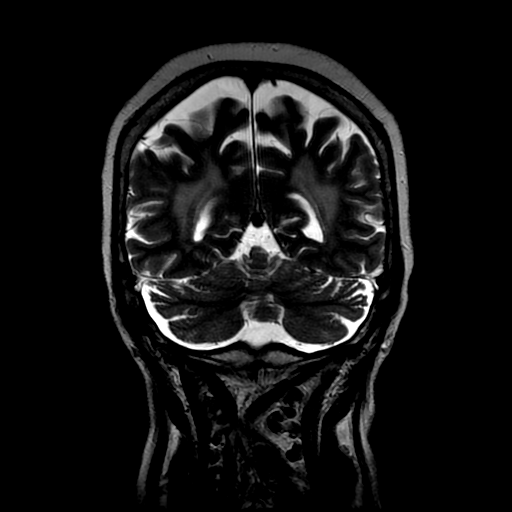
[im 17/26]
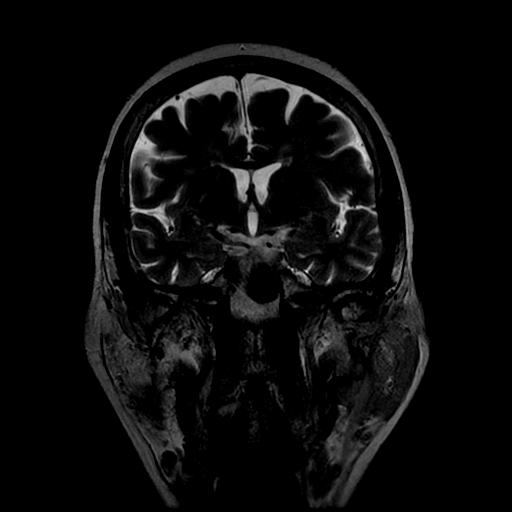
[im 26/26]
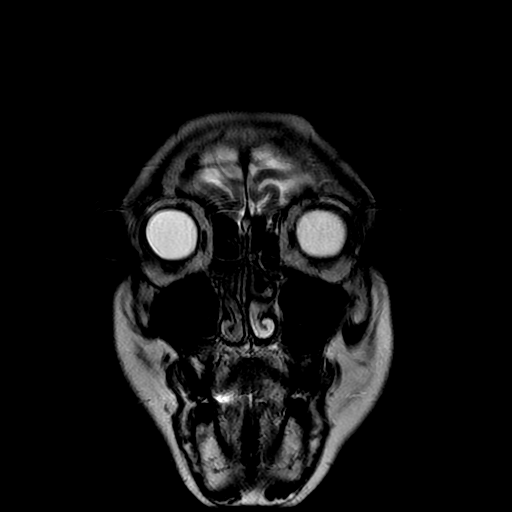

[Series 11: T1 post-contrast · coronal · 5.0mm · 0.45mm/px · 4 of 26 slices shown (1 of 2)]
[im 1/26]
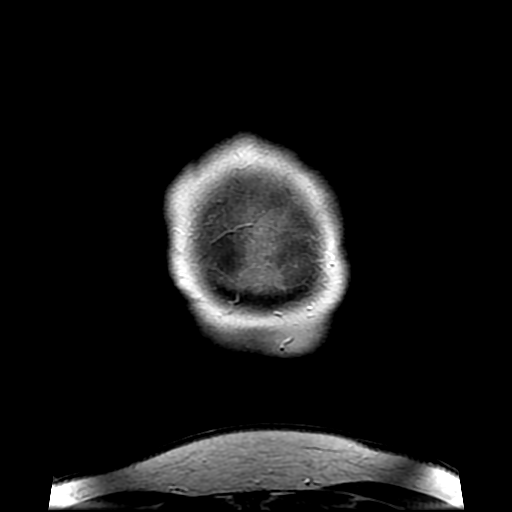
[im 9/26]
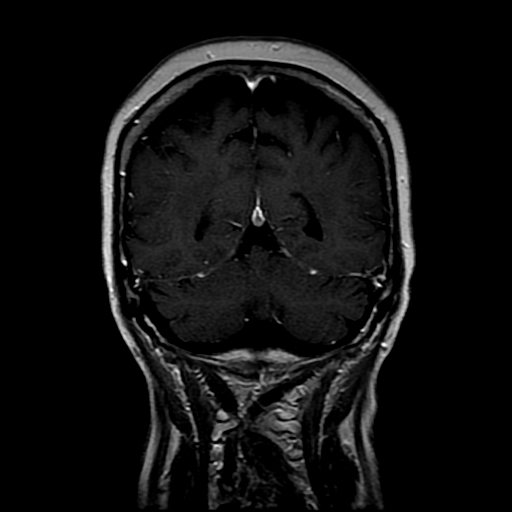
[im 17/26]
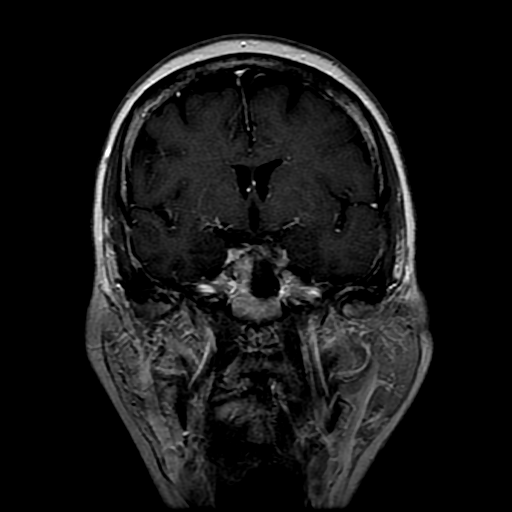
[im 26/26]
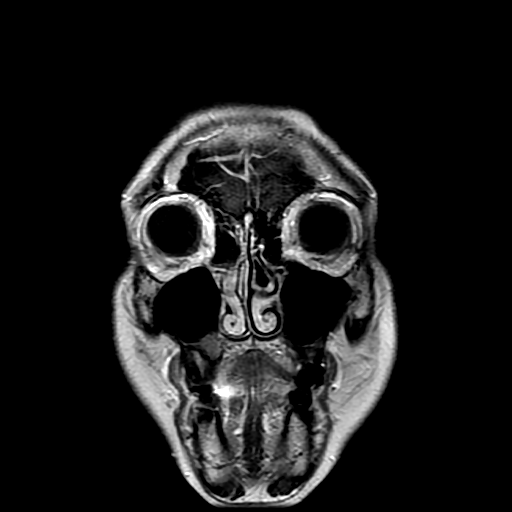

[Series 12: T1 post-contrast · sagittal · 5.0mm · 0.47mm/px · 3 of 24 slices shown (2 of 2)]
[im 1/24]
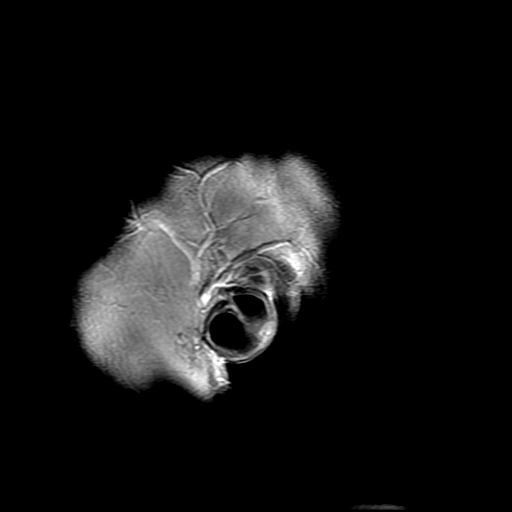
[im 12/24]
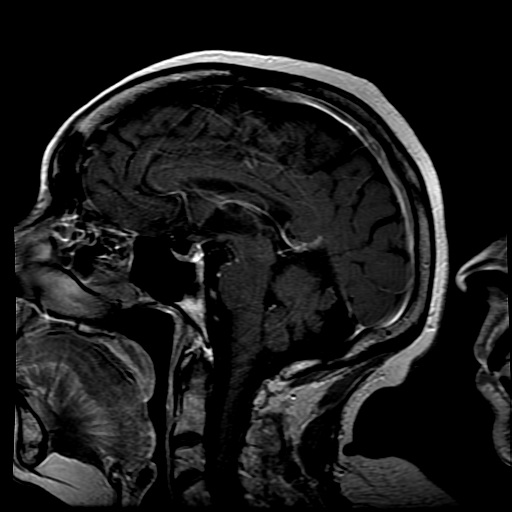
[im 24/24]
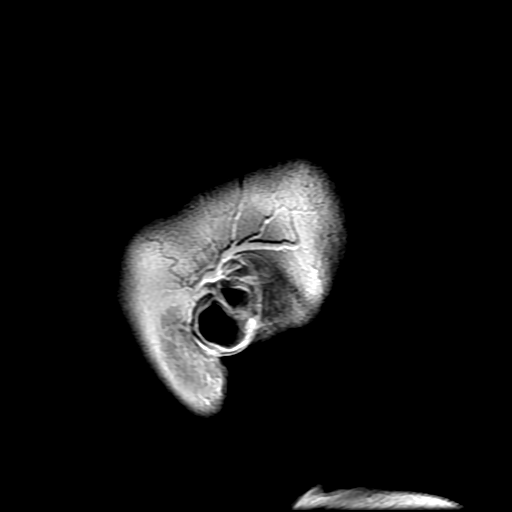

[Series 400: DWI · axial · 5.0mm · 1.09mm/px · z∈[-2,+134]mm · 4 of 30 slices shown (2 of 2)]
[im 1/30]
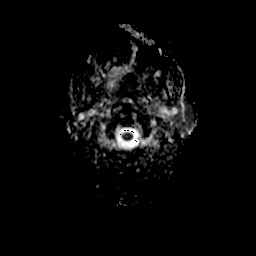
[im 10/30]
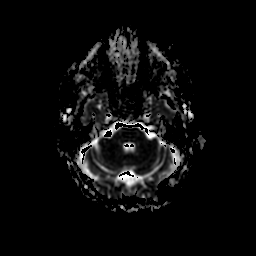
[im 20/30]
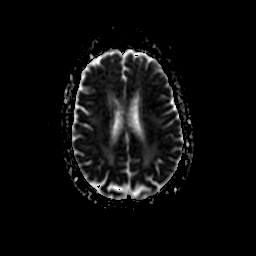
[im 30/30]
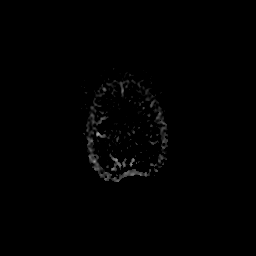

[27 of 48 positions shown; findings below may reference images not displayed]

FINDINGS: There is no evidence for acute infarction, intracranial
hemorrhage, mass lesion, hydrocephalus, or extra-axial fluid.
Moderate to severe atrophy is present.  Advanced chronic
microvascular ischemic change is present in the periventricular and
subcortical white matter.

Susceptibility artifact from previous craniotomy on the left is
noted.  The major intracranial vascular structures appear patent.

Post infusion, there is minimal nodular enhancement adjacent to the
previous surgical site.  This is actually diminished in comparison
to 3662, and likely represents scarring.Calvarium grossly intact.
Upper cervical canal unremarkable.
IMPRESSION: Atrophy and small vessel disease.  No acute intracranial findings.
Minimal enhancement adjacent to the previous surgical site is
diminishing compared with scan  of 3662 and likely represents
scarring.

## 2011-08-20 ENCOUNTER — Ambulatory Visit (HOSPITAL_COMMUNITY)
Admission: RE | Admit: 2011-08-20 | Discharge: 2011-08-20 | Disposition: A | Discharge status: Home / Self Care | Payer: Medicare Other | Source: Ambulatory Visit | Attending: Physician Assistant | Admitting: Physician Assistant

## 2011-08-20 DIAGNOSIS — R599 Enlarged lymph nodes, unspecified: Secondary | ICD-10-CM | POA: Insufficient documentation

## 2011-08-20 DIAGNOSIS — E0789 Other specified disorders of thyroid: Secondary | ICD-10-CM | POA: Insufficient documentation

## 2011-08-20 DIAGNOSIS — R220 Localized swelling, mass and lump, head: Secondary | ICD-10-CM | POA: Insufficient documentation

## 2011-08-20 DIAGNOSIS — I6529 Occlusion and stenosis of unspecified carotid artery: Secondary | ICD-10-CM | POA: Insufficient documentation

## 2011-08-20 DIAGNOSIS — C7931 Secondary malignant neoplasm of brain: Secondary | ICD-10-CM | POA: Insufficient documentation

## 2011-08-20 DIAGNOSIS — G319 Degenerative disease of nervous system, unspecified: Secondary | ICD-10-CM | POA: Insufficient documentation

## 2011-08-20 DIAGNOSIS — C3490 Malignant neoplasm of unspecified part of unspecified bronchus or lung: Secondary | ICD-10-CM

## 2011-08-20 DIAGNOSIS — C7949 Secondary malignant neoplasm of other parts of nervous system: Secondary | ICD-10-CM | POA: Insufficient documentation

## 2011-08-20 MED ORDER — GADOBENATE DIMEGLUMINE 529 MG/ML IV SOLN
20.0000 mL | Freq: Once | INTRAVENOUS | Status: AC | PRN
  Administered 2011-08-20: 20 mL via INTRAVENOUS

## 2011-08-20 MED ORDER — IOHEXOL 300 MG/ML  SOLN
100.0000 mL | Freq: Once | INTRAMUSCULAR | Status: AC | PRN
  Administered 2011-08-20: 100 mL via INTRAVENOUS

## 2011-08-23 ENCOUNTER — Other Ambulatory Visit: Payer: Self-pay | Admitting: Internal Medicine

## 2011-08-23 ENCOUNTER — Other Ambulatory Visit (HOSPITAL_BASED_OUTPATIENT_CLINIC_OR_DEPARTMENT_OTHER): Payer: Medicare Other | Admitting: Lab

## 2011-08-23 ENCOUNTER — Ambulatory Visit (HOSPITAL_BASED_OUTPATIENT_CLINIC_OR_DEPARTMENT_OTHER): Payer: Medicare Other | Admitting: Internal Medicine

## 2011-08-23 VITALS — BP 112/73 | HR 83 | Temp 97.0°F | Ht 69.0 in | Wt 221.5 lb

## 2011-08-23 DIAGNOSIS — C3430 Malignant neoplasm of lower lobe, unspecified bronchus or lung: Secondary | ICD-10-CM | POA: Diagnosis not present

## 2011-08-23 DIAGNOSIS — C7931 Secondary malignant neoplasm of brain: Secondary | ICD-10-CM

## 2011-08-23 DIAGNOSIS — C7949 Secondary malignant neoplasm of other parts of nervous system: Secondary | ICD-10-CM

## 2011-08-23 DIAGNOSIS — C3490 Malignant neoplasm of unspecified part of unspecified bronchus or lung: Secondary | ICD-10-CM

## 2011-08-23 LAB — CBC WITH DIFFERENTIAL/PLATELET
BASO%: 0.3 % (ref 0.0–2.0)
Basophils Absolute: 0 10*3/uL (ref 0.0–0.1)
EOS%: 2.8 % (ref 0.0–7.0)
Eosinophils Absolute: 0.1 10*3/uL (ref 0.0–0.5)
HCT: 41 % (ref 34.8–46.6)
HGB: 13.7 g/dL (ref 11.6–15.9)
LYMPH%: 16.5 % (ref 14.0–49.7)
MCH: 30.6 pg (ref 25.1–34.0)
MCHC: 33.5 g/dL (ref 31.5–36.0)
MCV: 91.3 fL (ref 79.5–101.0)
MONO#: 0.3 10*3/uL (ref 0.1–0.9)
MONO%: 6.5 % (ref 0.0–14.0)
NEUT#: 3.1 10*3/uL (ref 1.5–6.5)
NEUT%: 73.9 % (ref 38.4–76.8)
Platelets: 247 10*3/uL (ref 145–400)
RBC: 4.49 10*6/uL (ref 3.70–5.45)
RDW: 13.7 % (ref 11.2–14.5)
WBC: 4.2 10*3/uL (ref 3.9–10.3)
lymph#: 0.7 10*3/uL — ABNORMAL LOW (ref 0.9–3.3)

## 2011-08-23 LAB — COMPREHENSIVE METABOLIC PANEL
ALT: 17 U/L (ref 0–35)
AST: 17 U/L (ref 0–37)
Albumin: 4.6 g/dL (ref 3.5–5.2)
Alkaline Phosphatase: 63 U/L (ref 39–117)
BUN: 10 mg/dL (ref 6–23)
CO2: 27 mEq/L (ref 19–32)
Calcium: 9.7 mg/dL (ref 8.4–10.5)
Chloride: 104 mEq/L (ref 96–112)
Creatinine, Ser: 0.88 mg/dL (ref 0.50–1.10)
Glucose, Bld: 86 mg/dL (ref 70–99)
Potassium: 3.4 mEq/L — ABNORMAL LOW (ref 3.5–5.3)
Sodium: 140 mEq/L (ref 135–145)
Total Bilirubin: 1.2 mg/dL (ref 0.3–1.2)
Total Protein: 6.6 g/dL (ref 6.0–8.3)

## 2011-08-23 NOTE — Progress Notes (Signed)
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   PRIOR THERAPY:  1. Status post left lower lobectomy under the care of Dr Edwyna Shell in October 2002. 2. Status post course of concurrent chemoradiation with weekly carboplatin and paclitaxel under the care of Dr Truett Perna and Dr Dorna Bloom in early 2003. 3. Status post treatment with Celebrex and Iressa according to the clinical trial at [facility] for a total of 1 year. 4. Status post left occipital craniotomy for tumor resection on September 21, 2005, under the care of Dr Franky Macho. 5. Status post whole brain irradiation under the care of Dr Dorna Bloom, completed November 07, 2005. 6. Status post gamma knife treatment to the resected cavitary recurrence on June 02, 2008, at [facility].  CURRENT THERAPY: Tarceva 150 mg by mouth daily with therapy beginning 06/03/2007.   INTERVAL HISTORY: Valerie [name] 66 y.o. female returns to the clinic today for  followup visit. The patient was found recently to have swelling in the right cervical area of the neck. I ordered restaging scans including MRI of the brain with and without contrast in addition to a CT scan of the neck, chest, abdomen and pelvis. She is here today for evaluation and discussion of her scan results. She is otherwise feeling fine. She is tolerating her treatment with Tarceva fairly well except for a few episodes of diarrhea every now and then. No significant skin rash. No weight loss or night sweats. No chest pain or shortness of breath.  MEDICAL HISTORY: Past Medical History  Diagnosis Date  . Lung cancer     lung ca dx 2002    ALLERGIES:   has no known allergies.  MEDICATIONS:  Current Outpatient Prescriptions  Medication Sig Dispense Refill  . alendronate (FOSAMAX) 70 MG tablet Take 70 mg by mouth every 7 (seven) days. Take with a full  glass of water on an empty stomach.       [name] aspirin EC 81 MG tablet Take 81 mg by mouth daily.        . Cholecalciferol (VITAMIN D3) 2000 UNITS TABS Take 2,000 Int'l Units by mouth daily.        [name] donepezil (ARICEPT) 10 MG tablet Take 10 mg by mouth at bedtime as needed.        . erlotinib (TARCEVA) 150 MG tablet Take 150 mg by mouth daily. Take on an empty stomach 1 hour before meals or 2 hours after.       . Multiple Vitamin (MULTIVITAMIN) tablet Take 1 tablet by mouth daily.          REVIEW OF SYSTEMS:  A comprehensive review of systems was negative except for: Gastrointestinal: positive for diarrhea   PHYSICAL EXAMINATION: General appearance: alert, cooperative and no distress Head: Normocephalic, without obvious abnormality, atraumatic Neck: no adenopathy Lymph nodes: Cervical, supraclavicular, and axillary nodes normal. Resp: clear to auscultation bilaterally Cardio: regular rate and rhythm, S1, S2 normal, no murmur, click, rub or gallop GI: soft, non-tender; bowel sounds normal; no masses,  no organomegaly Extremities: extremities normal, atraumatic, no cyanosis or edema Neurologic: Alert and oriented X 3, normal strength and tone. Normal symmetric reflexes. Normal coordination and gait  ECOG PERFORMANCE STATUS: 1 - Symptomatic but completely ambulatory  Blood pressure 112/73, pulse 83, temperature 97 F (36.1 C), temperature source Oral, height 5\' 9"  (1.753 m), weight 221 lb 8 oz (100.472 kg).  LABORATORY DATA: Lab Results  Component Value Date   WBC 4.2 08/23/2011   HGB 13.7 08/23/2011   HCT 41.0 08/23/2011   MCV 91.3 08/23/2011   PLT 247 08/23/2011      Chemistry      Component Value Date/Time   NA 139 08/15/2011 1045   NA 144 05/17/2011 0728   K 3.6 08/15/2011 1045   K 3.9 05/17/2011 0728   CL 107 08/15/2011 1045   CL 101 05/17/2011 0728   CO2 25 08/15/2011 1045   CO2 26 05/17/2011 0728   BUN 9 08/15/2011 1045   BUN 13 05/17/2011 0728   CREATININE 0.78 08/15/2011 1045    CREATININE 0.9 05/17/2011 0728      Component Value Date/Time   CALCIUM 9.5 08/15/2011 1045   CALCIUM 9.1 05/17/2011 0728   ALKPHOS 61 08/15/2011 1045   ALKPHOS 64 05/17/2011 0728   AST 17 08/15/2011 1045   AST 25 05/17/2011 0728   ALT 18 08/15/2011 1045   BILITOT 0.7 08/15/2011 1045   BILITOT 1.40 05/17/2011 0728       RADIOGRAPHIC STUDIES:  Ct Soft Tissue Neck W Contrast  08/20/2011  *RADIOLOGY REPORT*  Clinical Data: Neck mass.  Lung cancer.   CT NECK WITH CONTRAST  Technique:  Multidetector CT imaging of the neck was performed with intravenous contrast.  Contrast: OMNIPAQUE IOHEXOL 300 MG/ML IV SOLN  Comparison: 05/17/2011 chest CT.  11/30/2010 chest CT.  06/13/2010 MR brain.  09/20/2005 neck CT.  Findings: Right supraclavicular 2.3 x 1.2 x 1.3 cm adenopathy suspicious for malignancy.  Asymmetry of the parotid glands unchanged.  Prominent dental artifact.  Mild asymmetry of the posterior-superior nasopharynx and lower pharynx without discrete mass identified.  Heterogeneous thyroid gland with calcified 1 cm right lobe of thyroid lesion unchanged.  Prior left occipital craniotomy.  At the resection site, there is a tiny nodular area of enhancement (series 5 image 16).  This was noted on the prior MR scan and may represent postoperative changes. MR imaging would be more sensitive for detection of intracranial metastatic disease.  Arising from the root of the posterior right upper molars is a nonspecific cystic lesion extending into the right maxillary sinus. This does not appear to be significantly different than the prior exams.  Carotid bifurcation calcifications without hemodynamically significant stenosis.  IMPRESSION: Right supraclavicular 2.3 x 1.2 x 1.3 cm adenopathy suspicious for malignancy.  Heterogeneous thyroid gland with calcified 1 cm right lobe of thyroid lesion unchanged.  Prior left occipital craniotomy.  At the resection site, there is a tiny nodular area of enhancement  (series 5 image 16).  This was noted on the prior MR scan and may represent postoperative changes. MR imaging would be more sensitive for detection of intracranial metastatic disease.  Original Report Authenticated By: Fuller Canada, M.D.   Ct Chest W Contrast  08/20/2011  *RADIOLOGY REPORT*  Clinical Data:  Lung cancer diagnosed 2002 status post left lower lobectomy, chemotherapy and XRT complete, tarceva in progress, right neck nodule  CT CHEST, ABDOMEN AND PELVIS WITH CONTRAST  Technique:  Multidetector CT imaging of the chest, abdomen and pelvis was performed following the standard protocol during bolus administration of intravenous contrast.  Contrast: OMNIPAQUE IOHEXOL 300 MG/ML IV SOLN  Comparison:  05/17/2011   CT CHEST  Findings:  Status post left lower lobectomy.  Associated volume loss with pleural thickening in the left hemithorax.  Left paramediastinal radiation changes.  No suspicious pulmonary nodules.  No pleural  effusion or pneumothorax.  Visualized thyroid is unremarkable.  The heart is normal in size.  No pericardial effusion.  No suspicious mediastinal, hilar, or axillary lymphadenopathy.  12 mm short axis right supraclavicular lymph node (series 3/image 8), previously 10 mm, possibly with central necrosis.  Degenerative changes of the thoracic spine.  IMPRESSION: Status post left lower lobectomy with left paramediastinal radiation changes.  12 mm right supraclavicular lymph node, mildly increased, nonspecific but worrisome for malignancy.  Consider biopsy or PET CT for further evaluation.  Otherwise, no evidence of recurrent/metastatic disease in the chest.   CT ABDOMEN AND PELVIS  Findings:  Liver, spleen, pancreas, and adrenal glands within normal limits.  Gallbladder is unremarkable.  No intrahepatic or extrahepatic ductal dilatation.  Kidneys within normal limits.  No hydronephrosis.  No evidence of bowel obstruction.  No evidence of abdominal aortic aneurysm.  No  abdominopelvic ascites.  No suspicious abdominopelvic lymphadenopathy.  Uterus is mildly heterogeneous, suggesting uterine fibroids.  No adnexal masses.  Bladder is unremarkable.  Mild degenerative changes of the lumbar spine.  IMPRESSION: No evidence of metastatic disease in the abdomen/pelvis.  Original Report Authenticated By: Charline Bills, M.D.   Mr Laqueta Jean Wo Contrast  08/20/2011  *RADIOLOGY REPORT*  Clinical Data: History of lung cancer with metastatic disease to the brain requiring prior surgery.  Presently no neurological symptoms.  MRI HEAD WITHOUT AND WITH CONTRAST  Technique:  Multiplanar, multiecho pulse sequences of the brain and surrounding structures were obtained according to standard protocol without and with intravenous contrast  Contrast: 20mL MULTIHANCE GADOBENATE DIMEGLUMINE 529 MG/ML IV SOLN  Comparison: 08/20/2011 neck CT.  06/13/2010 and 02/25/2006 brain MR.  Findings: Prior left posterior temporal - occipital craniotomy for resection of tumor. The area of nodular enhancement adjacent to the craniotomy site at the posterior left temporal - occipital lobe junction is unchanged more suggestive of postoperative changes than tumor.  No new area of enhancement or bony destruction to suggest progressive intracranial metastatic disease.  No acute infarct.  Partially empty sella incidentally noted.  Prominent white matter type changes may represent result of small vessel disease and / or treatment tumor.  Mild global atrophy without hydrocephalus.  Major intracranial vascular structures are patent.  Minimal partial opacification right mastoid air cells.  Polypoid lesion on the right maxillary sinus originates from the base of the upper molars and is unchanged.  Tiny area of blood breakdown products right opercular region may be related to episode of remote hemorrhagic ischemia otherwise no evidence of intracranial hemorrhage.  IMPRESSION: Prior left posterior temporal - occipital craniotomy  for resection of tumor. The area of nodular enhancement adjacent to the craniotomy site at the posterior left temporal - occipital lobe junction is unchanged more suggestive of postoperative changes than tumor.  No new area of enhancement or bony destruction to suggest progressive intracranial metastatic disease.  No acute infarct.  Prominent white matter type changes may represent result of small vessel disease and / or treatment tumor.  Original Report Authenticated By: Fuller Canada, M.D.     ASSESSMENT: This is a very pleasant 66 years old African American female was metastatic non-small cell lung cancer, adenocarcinoma. The patient has been on treatment with Tarceva for more than 4 years. She is tolerating it fairly well but now she has no suspicious evidence for disease progression especially in the right supraclavicular area. I discussed the scan results with the patient today.    PLAN: I recommended for her to have a  PET scan performed to evaluate the right cervical lymphadenopathy.If the PET scan is positive, I referred the patient to Dr. Edwyna Shell for consideration of a right supraclavicular lymph node biopsy deferred discussion of treatment options. She would come back for followup visit in December 2 weeks. The patient agreed to the current plan.  All questions were answered. The patient knows to call the clinic with any problems, questions or concerns. We can certainly see the patient much sooner if necessary.

## 2011-08-28 ENCOUNTER — Other Ambulatory Visit: Payer: Self-pay | Admitting: *Deleted

## 2011-08-28 NOTE — Telephone Encounter (Signed)
THIS REQUEST WAS GIVEN TO DR.MOHAMED'S NURSE, DIANE BELL,RN. 

## 2011-08-29 ENCOUNTER — Encounter (HOSPITAL_COMMUNITY)
Admission: RE | Admit: 2011-08-29 | Discharge: 2011-08-29 | Disposition: A | Discharge status: Home / Self Care | Payer: Medicare Other | Source: Ambulatory Visit | Attending: Internal Medicine | Admitting: Internal Medicine

## 2011-08-29 ENCOUNTER — Other Ambulatory Visit: Payer: Self-pay | Admitting: Internal Medicine

## 2011-08-29 DIAGNOSIS — C3490 Malignant neoplasm of unspecified part of unspecified bronchus or lung: Secondary | ICD-10-CM

## 2011-08-29 LAB — GLUCOSE, CAPILLARY: Glucose-Capillary: 99 mg/dL (ref 70–99)

## 2011-08-29 IMAGING — CT CT CHEST W/ CM
2 of 5 series · 17 of 46 positions shown, 19 images · IV contrast (agent unspecified)
Comparison: 05/30/2009

CT CHEST

CLINICAL DATA: Lung cancer with metastatic disease to the brain.

CT CHEST, ABDOMEN AND PELVIS WITH CONTRAST
TECHNIQUE: Multidetector CT imaging of the chest, abdomen and
pelvis was performed following the standard protocol during bolus
administration of intravenous contrast.
Contrast: 100 ml [id number]

[Series 2: cap with st · axial · 0.85mm/px · z∈[-662,-57]mm · 14 of 137 slices shown, 16 images]
[im 8/137  soft-tissue]
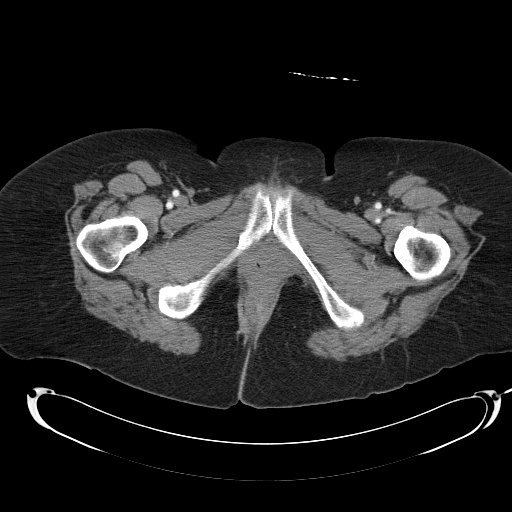
[im 8/137  bone]
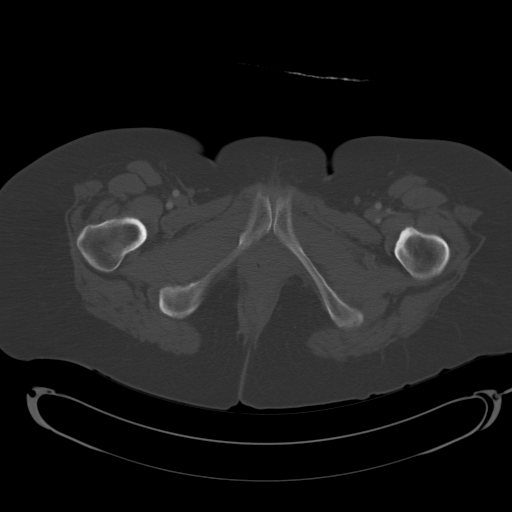
[im 15/137  soft-tissue]
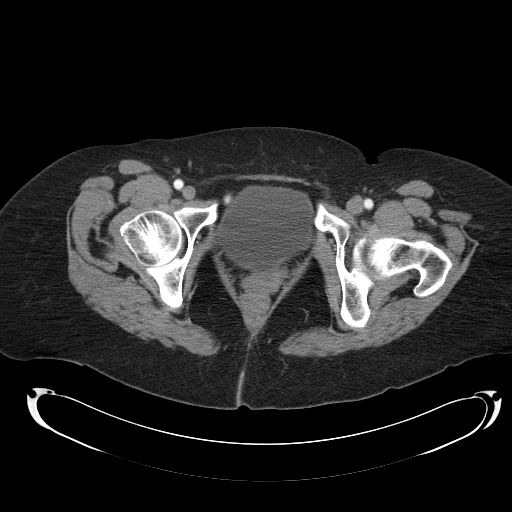
[im 29/137  soft-tissue]
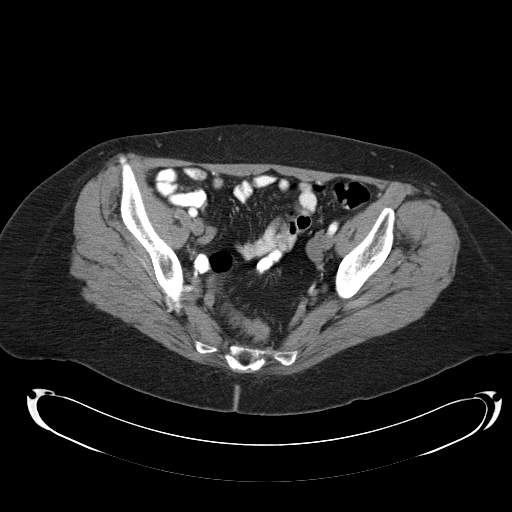
[im 36/137  soft-tissue]
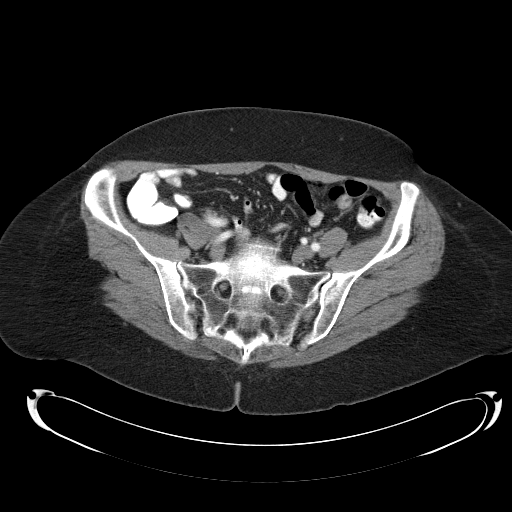
[im 43/137  soft-tissue]
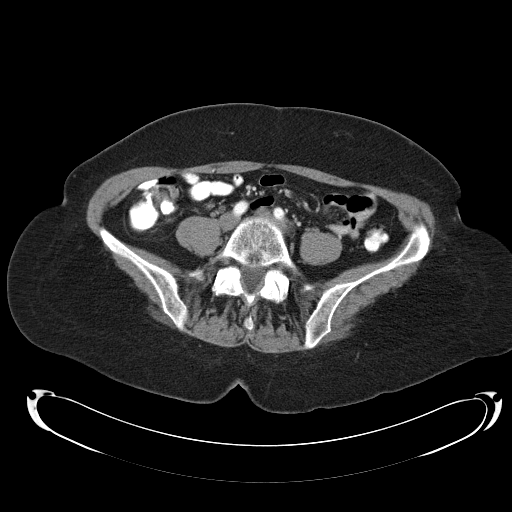
[im 58/137  soft-tissue]
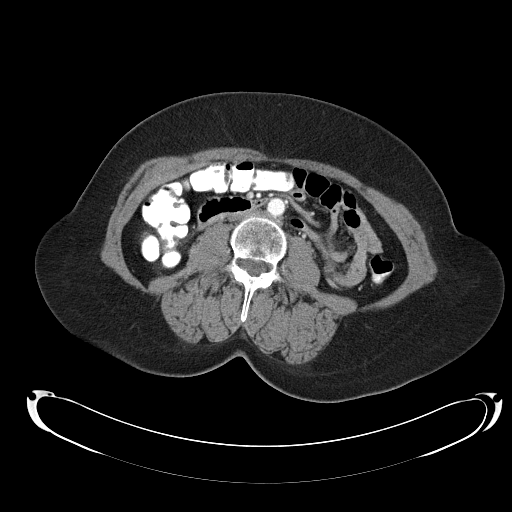
[im 65/137  soft-tissue]
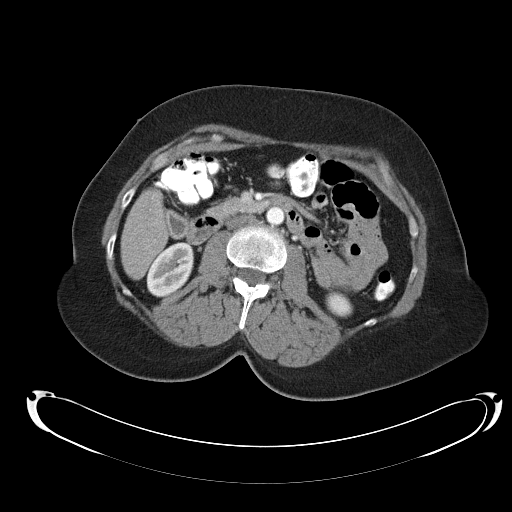
[im 72/137  soft-tissue]
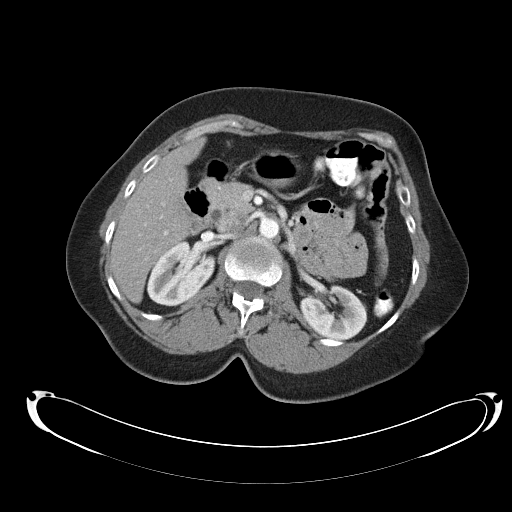
[im 79/137  soft-tissue]
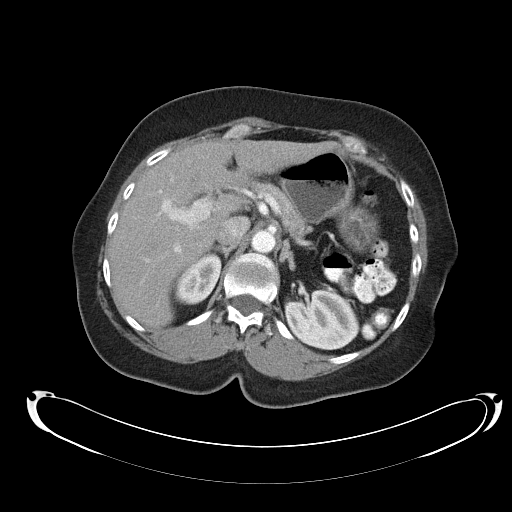
[im 79/137  bone]
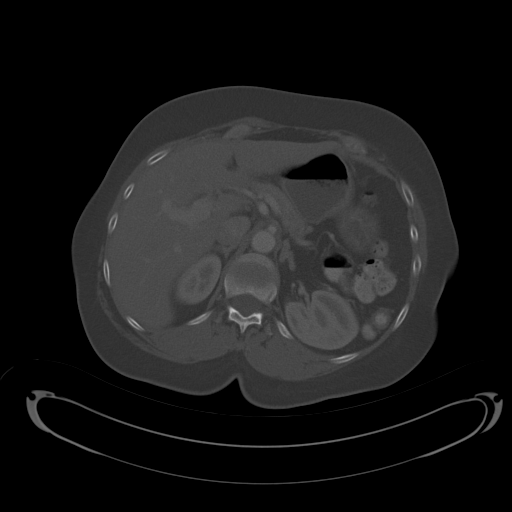
[im 94/137  soft-tissue]
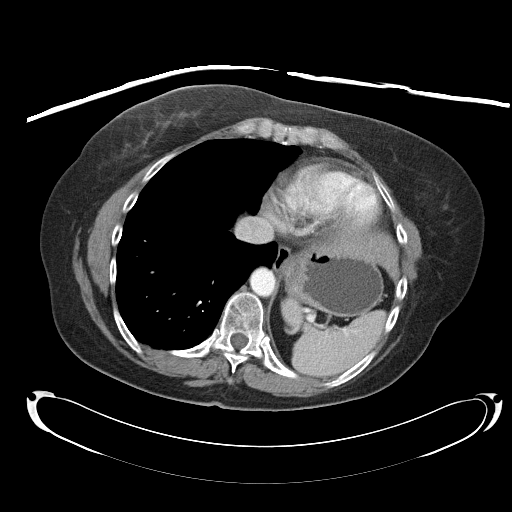
[im 101/137  soft-tissue]
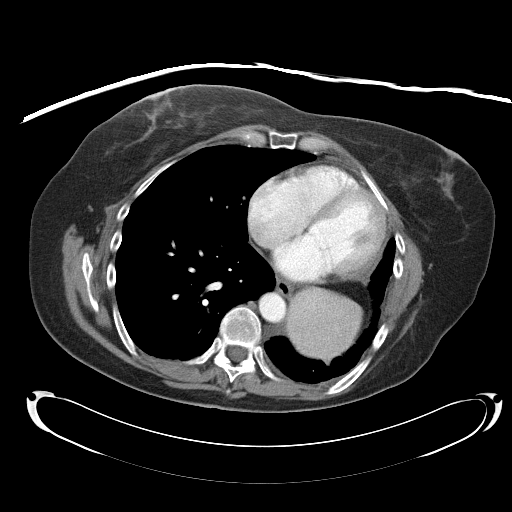
[im 108/137  soft-tissue]
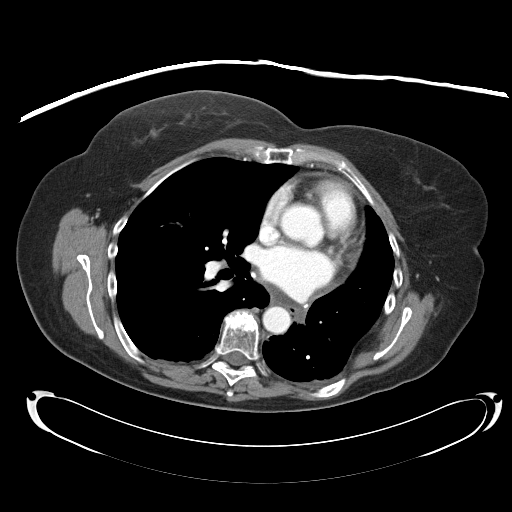
[im 122/137  soft-tissue]
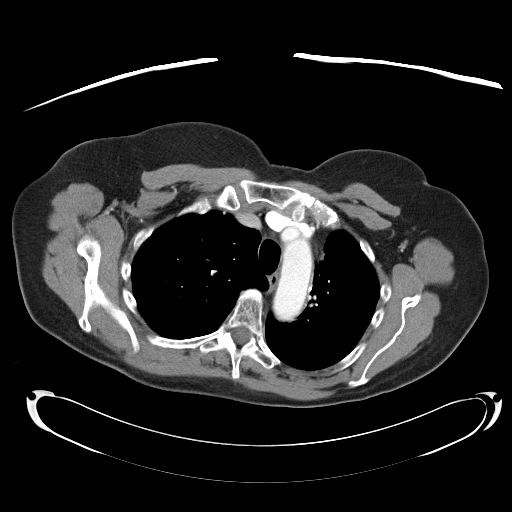
[im 129/137  soft-tissue]
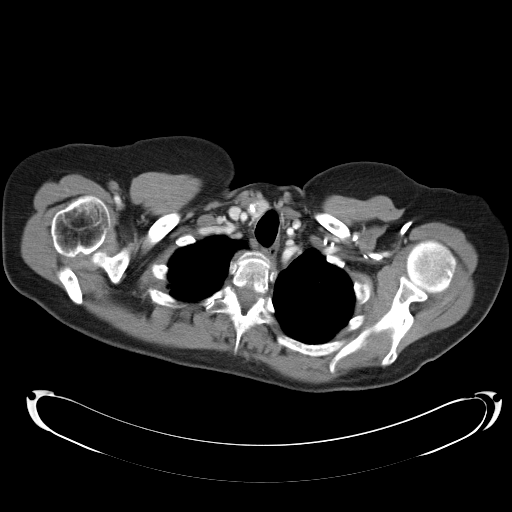

[Series 602: <mpr thick range> · coronal · 1.34mm/px · 3 of 81 slices shown]
[im 27/81  soft-tissue]
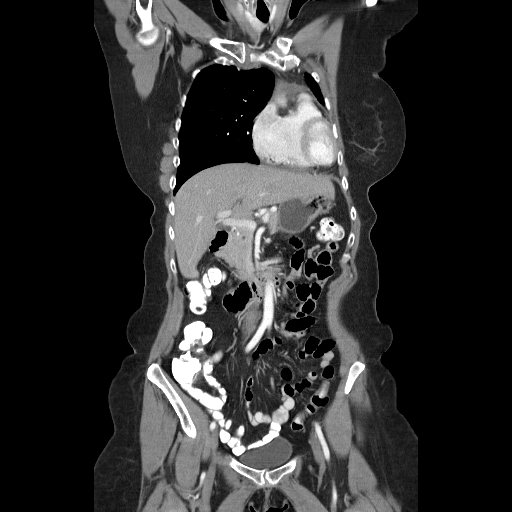
[im 36/81  soft-tissue]
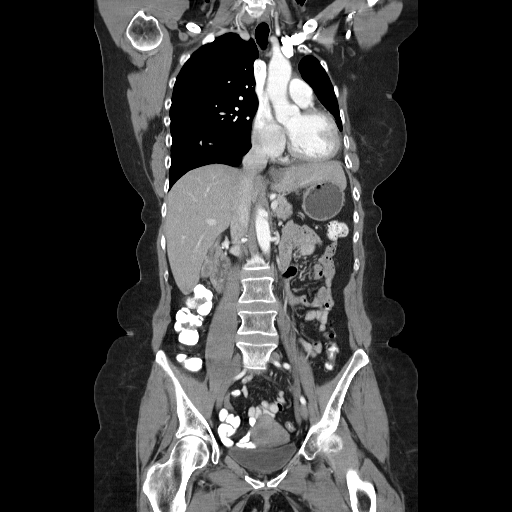
[im 45/81  soft-tissue]
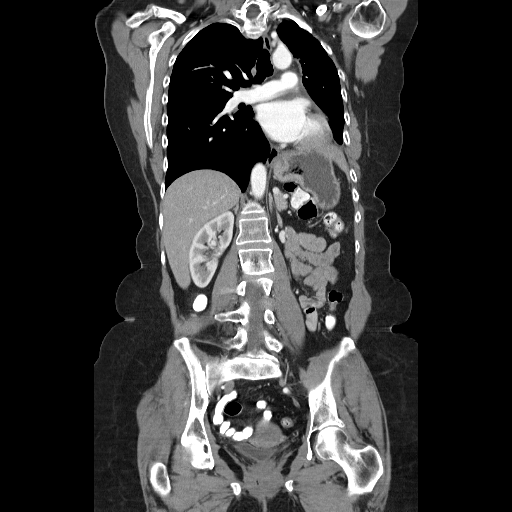

[17 of 46 positions shown; findings below may reference images not displayed]

FINDINGS: Stable calcified nodule inferiorly in the right thyroid
lobe noted.

Left hemithoracic postoperative findings noted.  There is a small
amount of loculated pleural fluid on the left, stable appearance.
Left-sided volume loss is present.  Several 3 mm subpleural nodules
in the right lung appear stable.

Thoracic kyphosis noted.
IMPRESSION: 1.  The small subpleural nodules in the right lung are stable at 3
mm.  No progressive findings.

CT ABDOMEN AND PELVIS
FINDINGS: The liver, spleen, pancreas, and adrenal glands appear
unremarkable.

Chronic wall thickening of the gallbladder noted; a component of
this may be from underdistension.

There is at least partial duplication of the left collecting
system.  Otherwise the kidneys appear unremarkable.

No pathologic retroperitoneal or porta hepatis adenopathy is
identified.

No dilated bowel or discrete bowel abnormality is identified.

The appendix appears normal.  The uterus and adnexa likewise appear
unremarkable.

Urinary bladder appears normal.  No free pelvic fluid identified.
IMPRESSION: 1.  No specific findings metastatic disease to the abdomen or
pelvis.

2.  There is at least partial duplication of the left renal
collecting system.
3.  Chronic wall thickening of the gallbladder.

## 2011-08-29 MED ORDER — FLUDEOXYGLUCOSE F - 18 (FDG) INJECTION
17.3000 | Freq: Once | INTRAVENOUS | Status: AC | PRN
  Administered 2011-08-29: 17.3 via INTRAVENOUS

## 2011-08-29 MED ORDER — ERLOTINIB HCL 150 MG PO TABS: 150.0000 mg | ORAL_TABLET | Freq: Every day | ORAL | Status: DC

## 2011-08-29 NOTE — Telephone Encounter (Signed)
tarceva refill faxed to [facility]

## 2011-09-05 ENCOUNTER — Encounter: Payer: Self-pay | Admitting: *Deleted

## 2011-09-05 NOTE — Progress Notes (Signed)
[facility] faxed confirmation of Tarceva prescription shipment.  Shipped 09-04-11 with next business day delivery.

## 2011-09-06 ENCOUNTER — Ambulatory Visit (HOSPITAL_BASED_OUTPATIENT_CLINIC_OR_DEPARTMENT_OTHER): Payer: Medicare Other | Admitting: Internal Medicine

## 2011-09-06 VITALS — BP 125/78 | HR 75 | Temp 97.0°F | Ht 69.0 in | Wt 213.5 lb

## 2011-09-06 DIAGNOSIS — Z923 Personal history of irradiation: Secondary | ICD-10-CM

## 2011-09-06 DIAGNOSIS — C3490 Malignant neoplasm of unspecified part of unspecified bronchus or lung: Secondary | ICD-10-CM | POA: Diagnosis not present

## 2011-09-06 NOTE — Progress Notes (Signed)
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   PRIOR THERAPY:  1. Status post left lower lobectomy under the care of Dr Edwyna Shell in October 2002. 2. Status post course of concurrent chemoradiation with weekly carboplatin and paclitaxel under the care of Dr Truett Perna and Dr Dorna Bloom in early 2003. 3. Status post treatment with Celebrex and Iressa according to the clinical trial at [facility] for a total of 1 year. 4. Status post left occipital craniotomy for tumor resection on September 21, 2005, under the care of Dr Franky Macho. 5. Status post whole brain irradiation under the care of Dr Dorna Bloom, completed November 07, 2005. 6. Status post gamma knife treatment to the resected cavitary recurrence on June 02, 2008, at [facility].  CURRENT THERAPY: Tarceva 150 mg by mouth daily with therapy beginning 06/03/2007.   INTERVAL HISTORY: Valerie [name] 66 y.o. female returns to the clinic today for followup visit. The patient has no complaints today except for the swelling in the right side of her neck. She denied having any swallowing problems or sore throat. She has no weight loss or night sweats. No cough or hemoptysis. She has no chest pain or shortness of breath. The patient is tolerating her treatment with Tarceva fairly well with no significant diarrhea or skin rash.  She had a PET scan performed recently and she is here today for evaluation and discussion of her scan results.  MEDICAL HISTORY: Past Medical History  Diagnosis Date  . Lung cancer     lung ca dx 2002    ALLERGIES:   has no known allergies.  MEDICATIONS:  Current Outpatient Prescriptions  Medication Sig Dispense Refill  . alendronate (FOSAMAX) 70 MG tablet Take 70 mg by mouth every 7 (seven) days. Take with a full glass of water on an empty stomach.       [name] aspirin EC 81 MG  tablet Take 81 mg by mouth daily.        . Cholecalciferol (VITAMIN D3) 2000 UNITS TABS Take 2,000 Int'l Units by mouth daily.        [name] donepezil (ARICEPT) 10 MG tablet Take 10 mg by mouth at bedtime as needed.        . erlotinib (TARCEVA) 150 MG tablet Take 1 tablet (150 mg total) by mouth daily. Take on an empty stomach 1 hour before meals or 2 hours after.  30 tablet  0  . Multiple Vitamin (MULTIVITAMIN) tablet Take 1 tablet by mouth daily.         REVIEW OF SYSTEMS:  A comprehensive review of systems was negative except for: Ears, nose, mouth, throat, and face: positive for no sore throat   PHYSICAL EXAMINATION:  Gen. awake alert no acute distress. Neck: Showed palpable lymphadenopathy in the right cervical area measuring around 2 CM in size. With a small right supraclavicular lymph node. Chest clear to auscultation bilaterally. Cardiovascular: Normal S1, S2, no murmur or gallop. Extremity: No edema.  ECOG PERFORMANCE STATUS: 0 - Asymptomatic  Blood pressure 125/78, pulse 75, temperature 97 F (36.1 C), temperature source Oral, height 5\' 9"  (1.753 m), weight 213 lb 8 oz (96.843 kg).  LABORATORY DATA: Lab Results  Component Value Date   WBC 4.2 08/23/2011   HGB 13.7 08/23/2011   HCT 41.0 08/23/2011   MCV 91.3 08/23/2011   PLT 247 08/23/2011      Chemistry  Component Value Date/Time   NA 140 08/23/2011 1016   NA 140 08/23/2011 1016   NA 144 05/17/2011 0728   K 3.4* 08/23/2011 1016   K 3.4* 08/23/2011 1016   K 3.9 05/17/2011 0728   CL 104 08/23/2011 1016   CL 104 08/23/2011 1016   CL 101 05/17/2011 0728   CO2 27 08/23/2011 1016   CO2 27 08/23/2011 1016   CO2 26 05/17/2011 0728   BUN 10 08/23/2011 1016   BUN 10 08/23/2011 1016   BUN 13 05/17/2011 0728   CREATININE 0.88 08/23/2011 1016   CREATININE 0.88 08/23/2011 1016   CREATININE 0.9 05/17/2011 0728      Component Value Date/Time   CALCIUM 9.7 08/23/2011 1016   CALCIUM 9.7 08/23/2011 1016   CALCIUM 9.1 05/17/2011 0728   ALKPHOS 63 08/23/2011 1016    ALKPHOS 63 08/23/2011 1016   ALKPHOS 64 05/17/2011 0728   AST 17 08/23/2011 1016   AST 17 08/23/2011 1016   AST 25 05/17/2011 0728   ALT 17 08/23/2011 1016   ALT 17 08/23/2011 1016   BILITOT 1.2 08/23/2011 1016   BILITOT 1.2 08/23/2011 1016   BILITOT 1.40 05/17/2011 0728       RADIOGRAPHIC STUDIES: Nm Pet Image Initial (pi) Skull Base To Thigh  08/29/2011  *RADIOLOGY REPORT*  Clinical Data:  Initial treatment strategy for lung cancer. Restaging.  NUCLEAR MEDICINE PET CT INITIAL (PI) SKULL BASE TO THIGH  Technique:  17.3 mCi F-18 FDG was injected intravenously via the right wrist.  Full-ring PET imaging was performed from the skull base through the mid-thighs 62  minutes after injection.  CT data was obtained and used for attenuation correction and anatomic localization only.  (This was not acquired as a diagnostic CT examination.)  Fasting Blood Glucose:  99  Patient Weight:  210 pounds.  Comparison:  CT neck chest abdomen pelvis 08/20/2011  Findings: Focal uptake is seen along the left base of tongue or glossotonsillar sulcus, with an S U V max of 7.7 (PET image 35). Similarly, focal hypermetabolism is seen in the right lateral oropharynx with an S U V max of 6.1. In retrospect, it is difficult to completely exclude subtle soft tissue thickening in these areas.  There is focal uptake posterior to the right submandibular gland, with an SUV max of 6.1 (image 39).  This may correspond to a 1.2 cm long axis lymph node on diagnostic examination performed 08/20/2011 (series 5, image 42).  Uptake is seen in the left parotid, which has been shown to be asymmetrically increased in attenuation on CT dating back to 09/20/2005.  Finally, a 9 mm right supraclavicular lymph node (CT image 65) has an S U V max of 3.8.  No additional areas of abnormal hypermetabolism in the neck, chest, abdomen or pelvis.  CT images were performed for attenuation correction. Interpretation of these images will be deferred to diagnostic examinations  of the neck, chest, abdomen and pelvis performed 08/20/2011.  No intervening pericardial or new pleural effusion.  IMPRESSION:  1.  Areas of hypermetabolism along the left base of tongue and right oropharynx, with hypermetabolic right level II and right supraclavicular lymph nodes.  A head/neck primary malignancy is considered. 2.  No areas of abnormal hypermetabolism in the chest, abdomen or pelvis. 3.  Uptake within the left parotid gland corresponds to asymmetric increased attenuation on CT, dating back to 09/20/2005, suggesting the possibility of chronic inflammation. 4.  Interpretation of CT images will be deferred to  diagnostic examination performed 08/20/2011.  Original Report Authenticated By: Reyes Ivan, M.D.    ASSESSMENT: This is a very pleasant 66 years old [place] American female with history of metastatic non-small cell lung cancer currently on treatment with oral Tarceva 150 mg by mouth daily. The patient is tolerating her treatment fairly well with no significant adverse effect and no significant evidence for lung cancer recurrence, but she presented with suspicious lesion in the left base of the tongue as well as hypermetabolic right cervical and supraclavicular lymph nodes suspicious for head and neck cancer. I discussed the scan results with the patient.  PLAN:  #1 I referred the patient to ENT for evaluation and biopsy of the suspicious lesion at the base of the tongue as well as the cervical lymphadenopathy.  #2 the patient will continue her treatment with Tarceva 150 mg by mouth daily. #3 I would see her back for followup visit in 3 weeks for reevaluation and discussion of her treatment options based on the biopsy results.  The patient was advised to call me immediately she has any concerning symptoms in the interval  All questions were answered. The patient knows to call the clinic with any problems, questions or concerns. We can certainly see the patient much sooner if  necessary.

## 2011-09-10 ENCOUNTER — Other Ambulatory Visit (HOSPITAL_COMMUNITY)
Admission: RE | Admit: 2011-09-10 | Discharge: 2011-09-10 | Disposition: A | Discharge status: Home / Self Care | Payer: Medicare Other | Source: Ambulatory Visit | Attending: Otolaryngology | Admitting: Otolaryngology

## 2011-09-10 ENCOUNTER — Other Ambulatory Visit: Payer: Self-pay | Admitting: Otolaryngology

## 2011-09-10 DIAGNOSIS — D497 Neoplasm of unspecified behavior of endocrine glands and other parts of nervous system: Secondary | ICD-10-CM | POA: Diagnosis not present

## 2011-09-10 DIAGNOSIS — C801 Malignant (primary) neoplasm, unspecified: Secondary | ICD-10-CM | POA: Insufficient documentation

## 2011-09-10 DIAGNOSIS — R221 Localized swelling, mass and lump, neck: Secondary | ICD-10-CM | POA: Diagnosis not present

## 2011-09-10 DIAGNOSIS — R220 Localized swelling, mass and lump, head: Secondary | ICD-10-CM | POA: Diagnosis not present

## 2011-09-10 DIAGNOSIS — C770 Secondary and unspecified malignant neoplasm of lymph nodes of head, face and neck: Secondary | ICD-10-CM | POA: Diagnosis not present

## 2011-09-10 DIAGNOSIS — R599 Enlarged lymph nodes, unspecified: Secondary | ICD-10-CM | POA: Diagnosis not present

## 2011-09-11 ENCOUNTER — Telehealth: Payer: Self-pay | Admitting: Internal Medicine

## 2011-09-11 NOTE — Telephone Encounter (Signed)
Pt asking if she has a brain lesion and how deep is it , what type of cancer and is it operable. [id number] told pt that Dr Donnald Garre sent her to an ENT for something on her tongue and lymph nodes in her neck -not the brain and that she misunderstood him. She saw Dr Pollyann Kennedy and he did  A biopsy.

## 2011-09-14 ENCOUNTER — Encounter (HOSPITAL_COMMUNITY): Payer: Self-pay | Admitting: Pharmacy Technician

## 2011-09-18 ENCOUNTER — Encounter (HOSPITAL_COMMUNITY): Payer: Self-pay | Admitting: *Deleted

## 2011-09-20 NOTE — H&P (Signed)
  Valerie [name] is an 65 y.o. female.   Chief Complaint: Neck mass HPI: History of lung adenocarcinoma. New neck mass adjacent to the right thyroid. Also abnormal findings in the pharynx on PET scan without any physical findings on exam.  Past Medical History  Diagnosis Date  . Lung cancer     lung ca dx 2002    No past surgical history on file.  No family history on file. Social History:  reports that she has never smoked. She does not have any smokeless tobacco history on file. Her alcohol and drug histories not on file.  Allergies: No Known Allergies  No current facility-administered medications on file as of .   Medications Prior to Admission  Medication Sig Dispense Refill  . alendronate (FOSAMAX) 70 MG tablet Take 70 mg by mouth every 7 (seven) days. Saturdays; Take with a full glass of water on an empty stomach.      . aspirin EC 81 MG tablet Take 81 mg by mouth daily.        . Cholecalciferol (VITAMIN D3) 2000 UNITS TABS Take 2,000 Int'l Units by mouth daily.        . donepezil (ARICEPT) 10 MG tablet Take 10 mg by mouth at bedtime.       . erlotinib (TARCEVA) 150 MG tablet Take 150 mg by mouth at bedtime. Take on an empty stomach 1 hour before meals or 2 hours after.      . Multiple Vitamin (MULTIVITAMIN) tablet Take 1 tablet by mouth daily.          No results found for this or any previous visit (from the past 48 hour(s)). No results found.  ROS: otherwise negative  There were no vitals taken for this visit.  PHYSICAL EXAM: Overall appearance:  Healthy appearing, in no distress Head:  Normocephalic, atraumatic. Ears: External auditory canals are clear; tympanic membranes are intact and the middle ears are free of any effusion. Nose: External nose is healthy in appearance. Internal nasal exam free of any lesions or obstruction. Oral Cavity:  There are no mucosal lesions or masses identified. Oral Pharynx/Hypopharynx/Larynx: no signs of any mucosal lesions or  masses identified. Vocal cords move normally. Neuro:  No identifiable neurologic deficits. Neck: right supraclavicular mass, also small mass in the right thyroid lobe.  Studies Reviewed: CT and PET scan    Assessment/Plan Recommend right lower neck dissection with possible thyroid lobectomy. Will also perform direct laryngoscopy to evaluate the abnormal signal on PET scan in the pharynx.  ROSEN,JEFRY H 09/20/2011, 8:01 PM    

## 2011-09-24 ENCOUNTER — Inpatient Hospital Stay (HOSPITAL_COMMUNITY): Admission: RE | Admit: 2011-09-24 | Payer: Medicare Other | Source: Ambulatory Visit

## 2011-09-26 ENCOUNTER — Ambulatory Visit: Payer: Medicare Other | Admitting: Internal Medicine

## 2011-09-26 ENCOUNTER — Other Ambulatory Visit: Payer: Medicare Other | Admitting: Lab

## 2011-09-27 ENCOUNTER — Encounter (HOSPITAL_COMMUNITY): Admission: RE | Payer: Self-pay | Source: Ambulatory Visit

## 2011-09-27 ENCOUNTER — Ambulatory Visit (HOSPITAL_COMMUNITY): Admission: RE | Admit: 2011-09-27 | Payer: Medicare Other | Source: Ambulatory Visit | Admitting: Otolaryngology

## 2011-09-27 SURGERY — DISSECTION, NECK, RADICAL
Anesthesia: General | Laterality: Right

## 2011-10-01 ENCOUNTER — Other Ambulatory Visit: Payer: Self-pay | Admitting: *Deleted

## 2011-10-01 DIAGNOSIS — C3430 Malignant neoplasm of lower lobe, unspecified bronchus or lung: Secondary | ICD-10-CM

## 2011-10-01 DIAGNOSIS — C714 Malignant neoplasm of occipital lobe: Secondary | ICD-10-CM

## 2011-10-01 NOTE — Telephone Encounter (Signed)
THIS REQUEST WAS GIVEN TO DR.MOHAMED'S NURSE, STEPHANIE JOHNSON,RN. 

## 2011-10-02 MED ORDER — ERLOTINIB HCL 150 MG PO TABS: 150.0000 mg | ORAL_TABLET | Freq: Every day | ORAL | Status: DC

## 2011-10-02 NOTE — Telephone Encounter (Signed)
Addended by: Arvilla Meres on: 10/02/2011 04:22 PM   Modules accepted: Orders

## 2011-10-04 ENCOUNTER — Encounter: Payer: Self-pay | Admitting: *Deleted

## 2011-10-04 NOTE — Progress Notes (Signed)
Received fax from [employer] that pt's rx was shipped 10/03/11.  SLJ

## 2011-10-08 ENCOUNTER — Encounter (HOSPITAL_COMMUNITY): Payer: Self-pay | Admitting: Pharmacy Technician

## 2011-10-08 ENCOUNTER — Other Ambulatory Visit: Payer: Self-pay | Admitting: *Deleted

## 2011-10-08 NOTE — Progress Notes (Signed)
Pt called stating that her biopsy is scheduled for 10/18/11.  Onc tx schedule filled out.  SLJ

## 2011-10-09 ENCOUNTER — Telehealth: Payer: Self-pay | Admitting: Internal Medicine

## 2011-10-09 NOTE — Telephone Encounter (Signed)
spoke with pt and she is aware of her 3/12 appt   aom

## 2011-10-16 ENCOUNTER — Encounter (HOSPITAL_COMMUNITY)
Admission: RE | Admit: 2011-10-16 | Discharge: 2011-10-16 | Disposition: A | Discharge status: Home / Self Care | Payer: Medicare Other | Source: Ambulatory Visit | Attending: Otolaryngology | Admitting: Otolaryngology

## 2011-10-16 ENCOUNTER — Encounter (HOSPITAL_COMMUNITY)
Admission: RE | Admit: 2011-10-16 | Discharge: 2011-10-16 | Disposition: A | Discharge status: Home / Self Care | Payer: Medicare Other | Source: Ambulatory Visit | Attending: Anesthesiology | Admitting: Anesthesiology

## 2011-10-16 ENCOUNTER — Encounter (HOSPITAL_COMMUNITY): Payer: Self-pay

## 2011-10-16 ENCOUNTER — Telehealth: Payer: Self-pay | Admitting: Internal Medicine

## 2011-10-16 DIAGNOSIS — Z01811 Encounter for preprocedural respiratory examination: Secondary | ICD-10-CM | POA: Diagnosis not present

## 2011-10-16 HISTORY — DX: Benign neoplasm of thyroid gland: D34

## 2011-10-16 HISTORY — DX: Shortness of breath: R06.02

## 2011-10-16 HISTORY — DX: Cardiac murmur, unspecified: R01.1

## 2011-10-16 HISTORY — DX: Unspecified osteoarthritis, unspecified site: M19.90

## 2011-10-16 LAB — SURGICAL PCR SCREEN
MRSA, PCR: NEGATIVE
Staphylococcus aureus: POSITIVE — AB

## 2011-10-16 LAB — CBC
HCT: 40.7 % (ref 36.0–46.0)
Hemoglobin: 13.7 g/dL (ref 12.0–15.0)
MCH: 30.8 pg (ref 26.0–34.0)
MCHC: 33.7 g/dL (ref 30.0–36.0)
MCV: 91.5 fL (ref 78.0–100.0)
Platelets: 206 10*3/uL (ref 150–400)
RBC: 4.45 MIL/uL (ref 3.87–5.11)
RDW: 13.9 % (ref 11.5–15.5)
WBC: 4.4 10*3/uL (ref 4.0–10.5)

## 2011-10-16 LAB — BASIC METABOLIC PANEL
BUN: 11 mg/dL (ref 6–23)
CO2: 27 mEq/L (ref 19–32)
Calcium: 9.9 mg/dL (ref 8.4–10.5)
Chloride: 104 mEq/L (ref 96–112)
Creatinine, Ser: 0.81 mg/dL (ref 0.50–1.10)
GFR calc Af Amer: 86 mL/min — ABNORMAL LOW (ref >90)
GFR calc non Af Amer: 74 mL/min — ABNORMAL LOW (ref >90)
Glucose, Bld: 89 mg/dL (ref 70–99)
Potassium: 3.4 mEq/L — ABNORMAL LOW (ref 3.5–5.1)
Sodium: 140 mEq/L (ref 135–145)

## 2011-10-16 NOTE — Telephone Encounter (Signed)
Wants to take her own Tarceva while in hospital due to cost. I told her to talk to admitting provider and let him/her know.Pt voices understanding.

## 2011-10-16 NOTE — Pre-Procedure Instructions (Addendum)
20 Valerie [name]  10/16/2011   Your procedure is scheduled on:  2.28.13*  Report to [facility] at730* AM.  Call this number if you have problems the morning of surgery: [phone]   Remember:   Do not eat food:After Midnight.  May have clear liquids: up to 4 Hours before arrival. 330 am  Clear liquids include soda, tea, black coffee, apple or grape juice, broth.  Take these medicines the morning of surgery with A SIP OF WATER Aricept. Tarceva, eye drops. STOP any aspirin, herbal meds, nsaids, blood thinners   Do not wear jewelry, make-up or nail polish.  Do not wear lotions, powders, or perfumes. You may wear deodorant.  Do not shave 48 hours prior to surgery.  Do not bring valuables to the hospital.  Contacts, dentures or bridgework may not be worn into surgery.  Leave suitcase in the car. After surgery it may be brought to your room.  For patients admitted to the hospital, checkout time is 11:00 AM the day of discharge.   Patients discharged the day of surgery will not be allowed to drive home.  Name and phone number of your driver: [phone]  Everlean Alstrom spouse  Special Instructions: CHG Shower Use Special Wash: 1/2 bottle night before surgery and 1/2 bottle morning of surgery.   Please read over the following fact sheets that you were given: Pain Booklet, Coughing and Deep Breathing, MRSA Information and Surgical Site Infection Prevention

## 2011-10-17 MED ORDER — CEFAZOLIN SODIUM 1-5 GM-% IV SOLN: 1.0000 g | INTRAVENOUS | Status: DC

## 2011-10-18 ENCOUNTER — Encounter (HOSPITAL_COMMUNITY): Payer: Self-pay | Admitting: *Deleted

## 2011-10-18 ENCOUNTER — Inpatient Hospital Stay (HOSPITAL_COMMUNITY)
Admission: RE | Admit: 2011-10-18 | Discharge: 2011-10-21 | DRG: 627 | Disposition: A | Discharge status: Home / Self Care | Payer: Medicare Other | Source: Ambulatory Visit | Attending: Otolaryngology | Admitting: Otolaryngology

## 2011-10-18 ENCOUNTER — Ambulatory Visit (HOSPITAL_COMMUNITY): Payer: Medicare Other | Admitting: Anesthesiology

## 2011-10-18 ENCOUNTER — Encounter (HOSPITAL_COMMUNITY)
Admission: RE | Disposition: A | Discharge status: Home / Self Care | Payer: Self-pay | Source: Ambulatory Visit | Attending: Otolaryngology

## 2011-10-18 ENCOUNTER — Encounter (HOSPITAL_COMMUNITY): Payer: Self-pay | Admitting: Anesthesiology

## 2011-10-18 DIAGNOSIS — E559 Vitamin D deficiency, unspecified: Secondary | ICD-10-CM | POA: Diagnosis not present

## 2011-10-18 DIAGNOSIS — R220 Localized swelling, mass and lump, head: Secondary | ICD-10-CM | POA: Diagnosis not present

## 2011-10-18 DIAGNOSIS — E041 Nontoxic single thyroid nodule: Secondary | ICD-10-CM | POA: Diagnosis not present

## 2011-10-18 DIAGNOSIS — M810 Age-related osteoporosis without current pathological fracture: Secondary | ICD-10-CM | POA: Diagnosis not present

## 2011-10-18 DIAGNOSIS — C770 Secondary and unspecified malignant neoplasm of lymph nodes of head, face and neck: Secondary | ICD-10-CM | POA: Diagnosis not present

## 2011-10-18 DIAGNOSIS — R062 Wheezing: Secondary | ICD-10-CM | POA: Diagnosis not present

## 2011-10-18 DIAGNOSIS — Z09 Encounter for follow-up examination after completed treatment for conditions other than malignant neoplasm: Secondary | ICD-10-CM | POA: Diagnosis not present

## 2011-10-18 DIAGNOSIS — Z7982 Long term (current) use of aspirin: Secondary | ICD-10-CM | POA: Diagnosis not present

## 2011-10-18 DIAGNOSIS — D497 Neoplasm of unspecified behavior of endocrine glands and other parts of nervous system: Secondary | ICD-10-CM | POA: Diagnosis not present

## 2011-10-18 DIAGNOSIS — F0390 Unspecified dementia without behavioral disturbance: Secondary | ICD-10-CM | POA: Diagnosis present

## 2011-10-18 DIAGNOSIS — Z85118 Personal history of other malignant neoplasm of bronchus and lung: Secondary | ICD-10-CM | POA: Diagnosis not present

## 2011-10-18 DIAGNOSIS — Z85841 Personal history of malignant neoplasm of brain: Secondary | ICD-10-CM | POA: Diagnosis not present

## 2011-10-18 DIAGNOSIS — R413 Other amnesia: Secondary | ICD-10-CM

## 2011-10-18 DIAGNOSIS — R0602 Shortness of breath: Secondary | ICD-10-CM | POA: Diagnosis not present

## 2011-10-18 DIAGNOSIS — C3490 Malignant neoplasm of unspecified part of unspecified bronchus or lung: Secondary | ICD-10-CM | POA: Diagnosis not present

## 2011-10-18 DIAGNOSIS — Z01812 Encounter for preprocedural laboratory examination: Secondary | ICD-10-CM

## 2011-10-18 DIAGNOSIS — C799 Secondary malignant neoplasm of unspecified site: Secondary | ICD-10-CM

## 2011-10-18 HISTORY — PX: RADICAL NECK DISSECTION: SHX2284

## 2011-10-18 HISTORY — PX: THYROIDECTOMY: SHX17

## 2011-10-18 SURGERY — DISSECTION, NECK, RADICAL
Anesthesia: General | Site: Neck | Laterality: Right | Wound class: Clean

## 2011-10-18 MED ORDER — LIDOCAINE HCL (CARDIAC) 20 MG/ML IV SOLN
INTRAVENOUS | Status: DC | PRN
  Administered 2011-10-18: 60 mg via INTRAVENOUS

## 2011-10-18 MED ORDER — ALENDRONATE SODIUM 70 MG PO TABS: 70.0000 mg | ORAL_TABLET | ORAL | Status: DC

## 2011-10-18 MED ORDER — BACITRACIN ZINC 500 UNIT/GM EX OINT
TOPICAL_OINTMENT | CUTANEOUS | Status: DC | PRN
  Administered 2011-10-18: 1 via TOPICAL

## 2011-10-18 MED ORDER — ONDANSETRON HCL 4 MG/2ML IJ SOLN
INTRAMUSCULAR | Status: DC | PRN
  Administered 2011-10-18: 4 mg via INTRAVENOUS

## 2011-10-18 MED ORDER — PROMETHAZINE HCL 25 MG RE SUPP: 25.0000 mg | Freq: Four times a day (QID) | RECTAL | Status: DC | PRN

## 2011-10-18 MED ORDER — ONDANSETRON HCL 4 MG/2ML IJ SOLN: 4.0000 mg | Freq: Once | INTRAMUSCULAR | Status: DC | PRN

## 2011-10-18 MED ORDER — FENTANYL CITRATE 0.05 MG/ML IJ SOLN
INTRAMUSCULAR | Status: DC | PRN
  Administered 2011-10-18 (×3): 50 ug via INTRAVENOUS
  Administered 2011-10-18: 100 ug via INTRAVENOUS

## 2011-10-18 MED ORDER — EPHEDRINE SULFATE 50 MG/ML IJ SOLN
INTRAMUSCULAR | Status: DC | PRN
  Administered 2011-10-18: 10 mg via INTRAVENOUS

## 2011-10-18 MED ORDER — DONEPEZIL HCL 10 MG PO TABS
10.0000 mg | ORAL_TABLET | Freq: Every day | ORAL | Status: DC
  Administered 2011-10-19 – 2011-10-20 (×2): 10 mg via ORAL
  Filled 2011-10-18 (×3): qty 1

## 2011-10-18 MED ORDER — ROCURONIUM BROMIDE 100 MG/10ML IV SOLN
INTRAVENOUS | Status: DC | PRN
  Administered 2011-10-18: 50 mg via INTRAVENOUS

## 2011-10-18 MED ORDER — LACTATED RINGERS IV SOLN
INTRAVENOUS | Status: DC | PRN
  Administered 2011-10-18 (×2): via INTRAVENOUS

## 2011-10-18 MED ORDER — ADULT MULTIVITAMIN W/MINERALS CH
1.0000 | ORAL_TABLET | Freq: Every day | ORAL | Status: DC
  Administered 2011-10-19 – 2011-10-20 (×2): 1 via ORAL
  Filled 2011-10-18 (×4): qty 1

## 2011-10-18 MED ORDER — ASPIRIN EC 81 MG PO TBEC
81.0000 mg | DELAYED_RELEASE_TABLET | ORAL | Status: DC
  Administered 2011-10-20: 81 mg via ORAL
  Filled 2011-10-18 (×2): qty 1

## 2011-10-18 MED ORDER — LACTATED RINGERS IV SOLN
INTRAVENOUS | Status: DC
  Administered 2011-10-18 – 2011-10-19 (×2): via INTRAVENOUS

## 2011-10-18 MED ORDER — SODIUM CHLORIDE 0.9 % IR SOLN
Status: DC | PRN
  Administered 2011-10-18: 1000 mL

## 2011-10-18 MED ORDER — NAPHAZOLINE-PHENIRAMINE 0.025-0.3 % OP SOLN
1.0000 [drp] | Freq: Four times a day (QID) | OPHTHALMIC | Status: DC | PRN
  Filled 2011-10-18: qty 15

## 2011-10-18 MED ORDER — NEOSTIGMINE METHYLSULFATE 1 MG/ML IJ SOLN
INTRAMUSCULAR | Status: DC | PRN
  Administered 2011-10-18: 4 mg via INTRAVENOUS

## 2011-10-18 MED ORDER — VITAMIN D3 25 MCG (1000 UNIT) PO TABS
2000.0000 [IU] | ORAL_TABLET | Freq: Every day | ORAL | Status: DC
  Administered 2011-10-19 – 2011-10-20 (×2): 2000 [IU] via ORAL
  Filled 2011-10-18 (×4): qty 2

## 2011-10-18 MED ORDER — CEFAZOLIN SODIUM-DEXTROSE 2-3 GM-% IV SOLR
INTRAVENOUS | Status: AC
  Filled 2011-10-18: qty 50

## 2011-10-18 MED ORDER — BACITRACIN ZINC 500 UNIT/GM EX OINT
1.0000 "application " | TOPICAL_OINTMENT | Freq: Three times a day (TID) | CUTANEOUS | Status: DC
  Administered 2011-10-18 – 2011-10-21 (×8): 1 via TOPICAL
  Filled 2011-10-18: qty 15

## 2011-10-18 MED ORDER — HYDROCODONE-ACETAMINOPHEN 5-325 MG PO TABS
1.0000 | ORAL_TABLET | ORAL | Status: DC | PRN
  Administered 2011-10-19 – 2011-10-20 (×2): 2 via ORAL
  Administered 2011-10-20 – 2011-10-21 (×2): 1 via ORAL
  Filled 2011-10-18: qty 1
  Filled 2011-10-18: qty 2
  Filled 2011-10-18: qty 1
  Filled 2011-10-18 (×3): qty 2

## 2011-10-18 MED ORDER — VECURONIUM BROMIDE 10 MG IV SOLR
INTRAVENOUS | Status: DC | PRN
  Administered 2011-10-18 (×2): 2 mg via INTRAVENOUS
  Administered 2011-10-18: 3 mg via INTRAVENOUS

## 2011-10-18 MED ORDER — VITAMIN C 500 MG PO TABS
500.0000 mg | ORAL_TABLET | Freq: Every day | ORAL | Status: DC
  Administered 2011-10-19 – 2011-10-20 (×2): 500 mg via ORAL
  Filled 2011-10-18 (×4): qty 1

## 2011-10-18 MED ORDER — CEFAZOLIN SODIUM-DEXTROSE 2-3 GM-% IV SOLR
2.0000 g | INTRAVENOUS | Status: AC
  Administered 2011-10-18: 2 g via INTRAVENOUS

## 2011-10-18 MED ORDER — IBUPROFEN 100 MG/5ML PO SUSP
400.0000 mg | Freq: Four times a day (QID) | ORAL | Status: DC | PRN
  Filled 2011-10-18: qty 20

## 2011-10-18 MED ORDER — PROMETHAZINE HCL 25 MG PO TABS
25.0000 mg | ORAL_TABLET | Freq: Four times a day (QID) | ORAL | Status: DC | PRN
  Administered 2011-10-20: 25 mg via ORAL
  Filled 2011-10-18: qty 1

## 2011-10-18 MED ORDER — ERLOTINIB HCL 150 MG PO TABS
150.0000 mg | ORAL_TABLET | Freq: Every day | ORAL | Status: DC
  Administered 2011-10-19 – 2011-10-20 (×2): 150 mg via ORAL
  Filled 2011-10-18 (×3): qty 1

## 2011-10-18 MED ORDER — LIDOCAINE HCL 4 % MT SOLN
OROMUCOSAL | Status: DC | PRN
  Administered 2011-10-18: 4 mL via TOPICAL

## 2011-10-18 MED ORDER — HYDROMORPHONE HCL PF 1 MG/ML IJ SOLN
0.2500 mg | INTRAMUSCULAR | Status: DC | PRN
  Administered 2011-10-18: 0.25 mg via INTRAVENOUS

## 2011-10-18 MED ORDER — VITAMIN D3 50 MCG (2000 UT) PO TABS: 2000.0000 [IU] | ORAL_TABLET | Freq: Every day | ORAL | Status: DC

## 2011-10-18 MED ORDER — GLYCOPYRROLATE 0.2 MG/ML IJ SOLN
INTRAMUSCULAR | Status: DC | PRN
  Administered 2011-10-18: .6 mg via INTRAVENOUS

## 2011-10-18 MED ORDER — PROPOFOL 10 MG/ML IV EMUL
INTRAVENOUS | Status: DC | PRN
  Administered 2011-10-18: 200 mg via INTRAVENOUS

## 2011-10-18 SURGICAL SUPPLY — 68 items
ADH SKN CLS APL DERMABOND .7 (GAUZE/BANDAGES/DRESSINGS)
APPLIER CLIP 9.375 SM OPEN (CLIP) ×2
APR CLP SM 9.3 20 MLT OPN (CLIP) ×1
ATTRACTOMAT 16X20 MAGNETIC DRP (DRAPES) ×1 IMPLANT
CANISTER SUCTION 2500CC (MISCELLANEOUS) ×2 IMPLANT
CLEANER TIP ELECTROSURG 2X2 (MISCELLANEOUS) ×2 IMPLANT
CLIP APPLIE 9.375 SM OPEN (CLIP) IMPLANT
CLOTH BEACON ORANGE TIMEOUT ST (SAFETY) ×1 IMPLANT
CONNECTOR UNIV Y (MISCELLANEOUS) IMPLANT
CONT SPEC 4OZ CLIKSEAL STRL BL (MISCELLANEOUS) ×2 IMPLANT
CORDS BIPOLAR (ELECTRODE) ×2 IMPLANT
COVER SURGICAL LIGHT HANDLE (MISCELLANEOUS) ×3 IMPLANT
DECANTER SPIKE VIAL GLASS SM (MISCELLANEOUS) ×2 IMPLANT
DERMABOND ADVANCED (GAUZE/BANDAGES/DRESSINGS)
DERMABOND ADVANCED .7 DNX12 (GAUZE/BANDAGES/DRESSINGS) IMPLANT
DRAIN CHANNEL 15F RND FF W/TCR (WOUND CARE) ×1 IMPLANT
DRAIN JACKSON RD 7FR 3/32 (WOUND CARE) IMPLANT
DRAIN SNY 10 ROU (WOUND CARE) IMPLANT
DRAPE INCISE 23X17 IOBAN STRL (DRAPES)
DRAPE INCISE 23X17 STRL (DRAPES) IMPLANT
DRAPE INCISE IOBAN 23X17 STRL (DRAPES) IMPLANT
ELECT COATED BLADE 2.86 ST (ELECTRODE) ×2 IMPLANT
ELECT REM PT RETURN 9FT ADLT (ELECTROSURGICAL) ×2
ELECTRODE REM PT RTRN 9FT ADLT (ELECTROSURGICAL) ×1 IMPLANT
EVACUATOR SILICONE 100CC (DRAIN) ×2 IMPLANT
GAUZE SPONGE 4X4 16PLY XRAY LF (GAUZE/BANDAGES/DRESSINGS) ×3 IMPLANT
GLOVE BIO SURGEON STRL SZ7 (GLOVE) ×1 IMPLANT
GLOVE BIOGEL PI IND STRL 6.5 (GLOVE) IMPLANT
GLOVE BIOGEL PI INDICATOR 6.5 (GLOVE) ×2
GLOVE ECLIPSE 7.5 STRL STRAW (GLOVE) ×2 IMPLANT
GLOVE ECLIPSE 8.0 STRL XLNG CF (GLOVE) ×1 IMPLANT
GLOVE SURG SS PI 7.5 STRL IVOR (GLOVE) ×2 IMPLANT
GOWN BRE IMP SLV AUR XL STRL (GOWN DISPOSABLE) ×1 IMPLANT
GOWN STRL NON-REIN LRG LVL3 (GOWN DISPOSABLE) ×6 IMPLANT
KIT BASIN OR (CUSTOM PROCEDURE TRAY) ×2 IMPLANT
KIT ROOM TURNOVER OR (KITS) ×2 IMPLANT
LOCATOR NERVE 3 VOLT (DISPOSABLE) IMPLANT
NDL HYPO 25GX1X1/2 BEV (NEEDLE) ×1 IMPLANT
NEEDLE 27GAX1X1/2 (NEEDLE) ×2 IMPLANT
NEEDLE HYPO 25GX1X1/2 BEV (NEEDLE) IMPLANT
NS IRRIG 1000ML POUR BTL (IV SOLUTION) ×2 IMPLANT
PAD ARMBOARD 7.5X6 YLW CONV (MISCELLANEOUS) ×4 IMPLANT
PENCIL FOOT CONTROL (ELECTRODE) ×2 IMPLANT
SPECIMEN JAR MEDIUM (MISCELLANEOUS) ×2 IMPLANT
SPONGE INTESTINAL PEANUT (DISPOSABLE) ×1 IMPLANT
SPONGE LAP 18X18 X RAY DECT (DISPOSABLE) ×1 IMPLANT
STAPLER VISISTAT 35W (STAPLE) ×3 IMPLANT
SUT CHROMIC 3 0 SH 27 (SUTURE) ×3 IMPLANT
SUT CHROMIC 4 0 PS 2 18 (SUTURE) ×4 IMPLANT
SUT CHROMIC 5 0 P 3 (SUTURE) IMPLANT
SUT ETHILON 3 0 PS 1 (SUTURE) ×1 IMPLANT
SUT ETHILON 5 0 P 3 18 (SUTURE) ×1
SUT ETHILON 5 0 PS 2 18 (SUTURE) IMPLANT
SUT NYLON ETHILON 5-0 P-3 1X18 (SUTURE) ×1 IMPLANT
SUT SILK 2 0 (SUTURE) ×2
SUT SILK 2 0 REEL (SUTURE) IMPLANT
SUT SILK 2-0 18XBRD TIE 12 (SUTURE) IMPLANT
SUT SILK 3 0 REEL (SUTURE) IMPLANT
SUT SILK 3 0 SH CR/8 (SUTURE) ×2 IMPLANT
SUT SILK 4 0 RB 1 (SUTURE) ×1 IMPLANT
SUT SILK 4 0 REEL (SUTURE) ×4 IMPLANT
SUT VIC AB 3-0 SH 18 (SUTURE) ×1 IMPLANT
TOWEL OR 17X24 6PK STRL BLUE (TOWEL DISPOSABLE) ×2 IMPLANT
TOWEL OR 17X26 10 PK STRL BLUE (TOWEL DISPOSABLE) ×2 IMPLANT
TRAY ENT MC OR (CUSTOM PROCEDURE TRAY) ×2 IMPLANT
TRAY FOLEY CATH 14FRSI W/METER (CATHETERS) ×1 IMPLANT
TUBE FEEDING 10FR FLEXIFLO (MISCELLANEOUS) IMPLANT
WATER STERILE IRR 1000ML POUR (IV SOLUTION) ×1 IMPLANT

## 2011-10-18 NOTE — Preoperative (Signed)
Beta Blockers   Reason not to administer Beta Blockers:Not Applicable 

## 2011-10-18 NOTE — Op Note (Signed)
OPERATIVE REPORT  DATE OF SURGERY: 10/18/2011  PATIENT:  Valerie [name],  66 y.o. female  PRE-OPERATIVE DIAGNOSIS:  neck mass thyroid nodgel  POST-OPERATIVE DIAGNOSIS:  neck mass thyroid nodule  PROCEDURE:  Procedure(s): RADICAL NECK DISSECTION THYROIDECTOMY  SURGEON:  Susy Frizzle, MD  ASSISTANTS: Dr. Lazarus Salines   ANESTHESIA:   general  EBL:  100 ml  DRAINS: (15 Fr) Jackson-Pratt drain(s) with closed bulb suction in the neck   LOCAL MEDICATIONS USED:  NONE  SPECIMEN:  Source of Specimen:  Right thyroid lobe, right neck dissection  COUNTS:  YES  PROCEDURE DETAILS: Patient was taken to the operating room and placed on the operating table in the supine position. Following induction of general endotracheal anesthesia the neck was prepped and draped in a standard fashion. A shoulder roll was placed beneath the shoulders to extend the neck. A transverse incision about 2 fingerbreadths above the clavicle was outlined with an extension up to the right side towards the mastoid tip. Electrocautery was used to incise the skin and subcutaneous tissue as well as through the platysma layer. A subplatysmal flap was elevated to the thyroid notch anteriorly and to the area about the submandibular gland laterally and inferiorly to the clavicle. Fascia and lymph node bearing tissue was then brought down from the level of the submandibular gland the inferior border down along the strap muscles and along the lateral border of the sternocleidomastoid muscle. Strap muscles were skeletonized anteriorly. The right thyroid lobe was exposed and the mass contained within this lobe was identified. The superior vasculature were separately identified ligated between clamps and divided. The middle thyroid vein was treated in a similar fashion. Inferior vasculature was also ligated and divided. A suspicious inferior parathyroid gland was identified and preserved. The recurrent nerve was not identified. Gland was  brought forward off of the trachea dissecting through the ligament of Berry. The isthmus was divided using electrocautery. The specimen was sent for frozen section analysis and was found to contain no evidence of papillary thyroid carcinoma or adenocarcinoma.  The neck dissection was then performed. The fascial contents were dissected down inferiorly along the carotid sheath. The internal jugular vein was ligated about the level of the carotid bifurcation and was brought inferiorly. The carotid and vagus were identified and preserved. The jugular stumps were ligated with 2-0 free ties and 3-0 silk suture  ligature. The fascial tissue medial to the sternocleidomastoid was then dissected off of the medial aspect of the muscle back to the level of the cutaneous branches of the cervical plexus. As this was brought forward the fascia overlying the phrenic was also identified. The phrenic nerve was it was preserved. Dissection was continued down inferiorly to expose the paracervical trunk. This was ligated between clamps and divided. Omohyoid was divided to facilitate exposure down low. The lymph node bearing tissue and fascial tissue around the clavicle was dissected off the clavicle and dissection continued down towards the lower part of the internal jugular vein. This was exposed as it entered into the subclavian vein. The jugular was ligated between clamps and divided. Several lymphatic appearing vessels were also ligated. There is no evidence of lymphatic leak. The innominate artery was identified a low but higher than the vein. The tumor mass was contained within the tissue in this area just above the subclavian vein. Once the vessels were ligated as described above the specimen was sent for pathologic evaluation. The wound was irrigated with saline. There is no evidence of bleeding  or chyle leak. The drain was placed through a separate stab incision and sutured in place with a suture. The platysma layer was  reapproximated with chromic suture and staples were used on the skin. Bacitracin was applied to the incision. The patient was then awakened, extubated and transferred to recovery in stable condition .   PATIENT DISPOSITION:  PACU - hemodynamically stable.

## 2011-10-18 NOTE — Progress Notes (Signed)
Report given to jan clance rn as caregiver 

## 2011-10-18 NOTE — Anesthesia Preprocedure Evaluation (Addendum)
Anesthesia Evaluation  Patient identified by MRN, date of birth, ID band Patient awake    Reviewed: Allergy & Precautions, H&P , NPO status , Patient's Chart, lab work & pertinent test results  Airway Mallampati: I TM Distance: >3 FB Neck ROM: full    Dental  (+) Teeth Intact   Pulmonary shortness of breath,          Cardiovascular + Valvular Problems/Murmurs regular Normal    Neuro/Psych PSYCHIATRIC DISORDERS    GI/Hepatic   Endo/Other    Renal/GU      Musculoskeletal   Abdominal   Peds  Hematology   Anesthesia Other Findings   Reproductive/Obstetrics                          Anesthesia Physical Anesthesia Plan  ASA: II  Anesthesia Plan: General ETT   Post-op Pain Management:    Induction: Intravenous  Airway Management Planned: Oral ETT  Additional Equipment:   Intra-op Plan:   Post-operative Plan: Extubation in OR  Informed Consent: I have reviewed the patients History and Physical, chart, labs and discussed the procedure including the risks, benefits and alternatives for the proposed anesthesia with the patient or authorized representative who has indicated his/her understanding and acceptance.     Plan Discussed with: Anesthesiologist, CRNA and Surgeon  Anesthesia Plan Comments:         Anesthesia Quick Evaluation

## 2011-10-18 NOTE — Anesthesia Postprocedure Evaluation (Signed)
  Anesthesia Post-op Note  Patient: Valerie [name]  Procedure(s) Performed: Procedure(s) (LRB): RADICAL NECK DISSECTION (Right) THYROIDECTOMY (Right)  Patient Location: PACU  Anesthesia Type: General  Level of Consciousness: awake, alert , sedated and patient cooperative  Airway and Oxygen Therapy: Patient Spontanous Breathing and Patient connected to nasal cannula oxygen  Post-op Pain: none  Post-op Assessment: Post-op Vital signs reviewed, Patient's Cardiovascular Status Stable, Respiratory Function Stable, Patent Airway, No signs of Nausea or vomiting and Pain level controlled  Post-op Vital Signs: stable  Complications: No apparent anesthesia complications

## 2011-10-18 NOTE — Progress Notes (Signed)
Day of Surgery  Subjective: Doing well after surgery.  No complaints.  Objective: Vital signs in last 24 hours: Temp:  [97.1 F (36.2 C)-98 F (36.7 C)] 97.4 F (36.3 C) (02/28 1724) Pulse Rate:  [58-122] 58  (02/28 1724) Resp:  [9-22] 17  (02/28 1724) BP: (114-165)/(62-90) 149/62 mmHg (02/28 1724) SpO2:  [95 %-100 %] 100 % (02/28 1724) Weight:  [95.7 kg (210 lb 15.7 oz)] 95.7 kg (210 lb 15.7 oz) (02/28 1442)    Intake/Output from previous day:   Intake/Output this shift: Total I/O In: 2215 [P.O.:240; I.V.:1975] Out: 175 [Urine:55; Drains:70; Blood:50]  General: no distress, cooperative, alert. Neck: Right neck incision clean and intact.  No fluid collection.  Drain functioning.  CNs VII and XI normal and symmetric.  Voice good.  Lab Results:   Basename 10/16/11 1045  WBC 4.4  HGB 13.7  HCT 40.7  PLT 206   BMET  Basename 10/16/11 1045  NA 140  K 3.4*  CL 104  CO2 27  GLUCOSE 89  BUN 11  CREATININE 0.81  CALCIUM 9.9   PT/INR No results found for this basename: LABPROT:2,INR:2 in the last 72 hours ABG No results found for this basename: PHART:2,PCO2:2,PO2:2,HCO3:2 in the last 72 hours  Studies/Results: No results found.  Anti-infectives: Anti-infectives     Start     Dose/Rate Route Frequency Ordered Stop   10/18/11 0736   ceFAZolin (ANCEF) 2-3 GM-% IVPB SOLR     Comments: Lahoma Rocker: cabinet override         10/18/11 0736 10/18/11 1944   10/18/11 0733   ceFAZolin (ANCEF) IVPB 2 g/50 mL premix     Comments: Per weight based protocol.      2 g 100 mL/hr over 30 Minutes Intravenous 60 min pre-op 10/18/11 0733 10/18/11 0935   10/17/11 1200   ceFAZolin (ANCEF) IVPB 1 g/50 mL premix  Status:  Discontinued        1 g 100 mL/hr over 30 Minutes Intravenous 60 min pre-op 10/17/11 1150 10/18/11 0733          Assessment/Plan: s/p Procedure(s) (LRB): RADICAL NECK DISSECTION (Right) THYROIDECTOMY (Right) Doing well.  Continue drain.  LOS: 0 days     Aliannah Holstrom 10/18/2011

## 2011-10-18 NOTE — Transfer of Care (Signed)
Immediate Anesthesia Transfer of Care Note  Patient: Valerie [name]  Procedure(s) Performed: Procedure(s) (LRB): RADICAL NECK DISSECTION (Right) THYROIDECTOMY (Right)  Patient Location: PACU  Anesthesia Type: General  Level of Consciousness: awake, alert  and oriented  Airway & Oxygen Therapy: Patient Spontanous Breathing and Patient connected to face mask oxygen  Post-op Assessment: Report given to PACU RN and Post -op Vital signs reviewed and stable  Post vital signs: Reviewed and stable  Complications: No apparent anesthesia complications

## 2011-10-18 NOTE — Interval H&P Note (Signed)
History and Physical Interval Note:  10/18/2011 8:42 AM  Valerie [name]  has presented today for surgery, with the diagnosis of neck mass thyroid nodgel  The various methods of treatment have been discussed with the patient and family. After consideration of risks, benefits and other options for treatment, the patient has consented to  Procedure(s) (LRB): RADICAL NECK DISSECTION (Right) THYROIDECTOMY (Right) as a surgical intervention .  The patients' history has been reviewed, patient examined, no change in status, stable for surgery.  I have reviewed the patients' chart and labs.  Questions were answered to the patient's satisfaction.     Valerie [name]

## 2011-10-18 NOTE — H&P (View-Only) (Signed)
  Valerie [name] is an 66 y.o. female.   Chief Complaint: Neck mass HPI: History of lung adenocarcinoma. New neck mass adjacent to the right thyroid. Also abnormal findings in the pharynx on PET scan without any physical findings on exam.  Past Medical History  Diagnosis Date  . Lung cancer     lung ca dx 2002    No past surgical history on file.  No family history on file. Social History:  reports that she has never smoked. She does not have any smokeless tobacco history on file. Her alcohol and drug histories not on file.  Allergies: No Known Allergies  No current facility-administered medications on file as of .   Medications Prior to Admission  Medication Sig Dispense Refill  . alendronate (FOSAMAX) 70 MG tablet Take 70 mg by mouth every 7 (seven) days. Saturdays; Take with a full glass of water on an empty stomach.      [name] aspirin EC 81 MG tablet Take 81 mg by mouth daily.        . Cholecalciferol (VITAMIN D3) 2000 UNITS TABS Take 2,000 Int'l Units by mouth daily.        [name] donepezil (ARICEPT) 10 MG tablet Take 10 mg by mouth at bedtime.       . erlotinib (TARCEVA) 150 MG tablet Take 150 mg by mouth at bedtime. Take on an empty stomach 1 hour before meals or 2 hours after.      . Multiple Vitamin (MULTIVITAMIN) tablet Take 1 tablet by mouth daily.          No results found for this or any previous visit (from the past 48 hour(s)). No results found.  ROS: otherwise negative  There were no vitals taken for this visit.  PHYSICAL EXAM: Overall appearance:  Healthy appearing, in no distress Head:  Normocephalic, atraumatic. Ears: External auditory canals are clear; tympanic membranes are intact and the middle ears are free of any effusion. Nose: External nose is healthy in appearance. Internal nasal exam free of any lesions or obstruction. Oral Cavity:  There are no mucosal lesions or masses identified. Oral Pharynx/Hypopharynx/Larynx: no signs of any mucosal lesions or  masses identified. Vocal cords move normally. Neuro:  No identifiable neurologic deficits. Neck: right supraclavicular mass, also small mass in the right thyroid lobe.  Studies Reviewed: CT and PET scan    Assessment/Plan Recommend right lower neck dissection with possible thyroid lobectomy. Will also perform direct laryngoscopy to evaluate the abnormal signal on PET scan in the pharynx.  Valerie [name] H 09/20/2011, 8:01 PM

## 2011-10-18 NOTE — Anesthesia Procedure Notes (Signed)
Procedure Name: Intubation Date/Time: 10/18/2011 9:33 AM Performed by: Caryn Bee Pre-anesthesia Checklist: Patient identified, Emergency Drugs available, Suction available, Patient being monitored and Timeout performed Patient Re-evaluated:Patient Re-evaluated prior to inductionOxygen Delivery Method: Circle system utilized Preoxygenation: Pre-oxygenation with 100% oxygen Intubation Type: IV induction Ventilation: Mask ventilation without difficulty Laryngoscope Size: Mac and 3 Grade View: Grade II Tube type: Oral Tube size: 7.5 mm Number of attempts: 1 Airway Equipment and Method: Stylet Placement Confirmation: positive ETCO2,  ETT inserted through vocal cords under direct vision and breath sounds checked- equal and bilateral Secured at: 22 cm Tube secured with: Tape Dental Injury: Teeth and Oropharynx as per pre-operative assessment

## 2011-10-19 NOTE — Progress Notes (Signed)
Patient ID: Valerie [name], female   DOB: Feb 26, 1946, 66 y.o.   MRN: [id number] Subjective: Doing well, minimal pain, swallowing well, just coughing a lot of mucous up.  Objective: Vital signs in last 24 hours: Temp:  [97.1 F (36.2 C)-99 F (37.2 C)] 98 F (36.7 C) (03/01 0956) Pulse Rate:  [58-122] 96  (03/01 0956) Resp:  [9-22] 18  (03/01 0956) BP: (117-165)/(61-90) 117/81 mmHg (03/01 0956) SpO2:  [96 %-100 %] 100 % (03/01 0956) Weight:  [95.7 kg (210 lb 15.7 oz)] 95.7 kg (210 lb 15.7 oz) (02/28 1442) Weight change:  Last BM Date: 10/17/11  Intake/Output from previous day: 02/28 0701 - 03/01 0700 In: 3433.8 [P.O.:240; I.V.:3193.8] Out: 1685 [Urine:1505; Drains:130; Blood:50] Intake/Output this shift:    PHYSICAL EXAM: Voice normal, cough strong. Neck looks excellent, JP in place with sero-sanguinous material.  Lab Results: No results found for this basename: WBC:2,HGB:2,HCT:2,PLT:2 in the last 72 hours BMET No results found for this basename: NA:2,K:2,CL:2,CO2:2,GLUCOSE:2,BUN:2,CREATININE:2,CALCIUM:2 in the last 72 hours  Studies/Results: No results found.  Medications: I have reviewed the patient's current medications.  Assessment/Plan: Stable post op. One more day with drain at least. Discharge when JP removed.  LOS: 1 day   ROSEN, JEFRY 10/19/2011, 10:55 AM

## 2011-10-20 ENCOUNTER — Inpatient Hospital Stay (HOSPITAL_COMMUNITY): Payer: Medicare Other

## 2011-10-20 ENCOUNTER — Encounter (HOSPITAL_COMMUNITY): Payer: Self-pay | Admitting: Otolaryngology

## 2011-10-20 DIAGNOSIS — Z09 Encounter for follow-up examination after completed treatment for conditions other than malignant neoplasm: Secondary | ICD-10-CM | POA: Diagnosis not present

## 2011-10-20 DIAGNOSIS — R062 Wheezing: Secondary | ICD-10-CM | POA: Diagnosis not present

## 2011-10-20 LAB — EXPECTORATED SPUTUM ASSESSMENT W GRAM STAIN, RFLX TO RESP C

## 2011-10-20 LAB — EXPECTORATED SPUTUM ASSESSMENT W REFEX TO RESP CULTURE

## 2011-10-20 NOTE — Progress Notes (Addendum)
Patient ID: Valerie [name], female   DOB: 1946/01/16, 66 y.o.   MRN: [id number] Subjective: Very weak, still coughing up large amounts of thick sputum.   Objective: Vital signs in last 24 hours: Temp:  [97.8 F (36.6 C)-99.3 F (37.4 C)] 97.8 F (36.6 C) (03/02 0600) Pulse Rate:  [73-110] 110  (03/02 0600) Resp:  [16-18] 16  (03/02 0600) BP: (115-135)/(67-82) 115/76 mmHg (03/02 0600) SpO2:  [96 %-100 %] 100 % (03/02 0600) Weight change:  Last BM Date: 10/17/11  Intake/Output from previous day: 03/01 0701 - 03/02 0700 In: 1360 [P.O.:960; I.V.:400] Out: 1650 [Urine:1600; Drains:50] Intake/Output this shift:    PHYSICAL EXAM: Voice is clear and strong. She is coughing up thick, slightly yellow sputum. Neck looks excellent. JP still with serous discharge, slowly decreasing.  Chest: Course rhonchi and mild wheezing bilaterally.  Lab Results: No results found for this basename: WBC:2,HGB:2,HCT:2,PLT:2 in the last 72 hours BMET No results found for this basename: NA:2,K:2,CL:2,CO2:2,GLUCOSE:2,BUN:2,CREATININE:2,CALCIUM:2 in the last 72 hours  Studies/Results: No results found.  Medications: I have reviewed the patient's current medications.  Assessment/Plan: Neck doing very well. Concerned about post op pneumonia. Will check a CXR today. Will keep drain in for another day. Chest PT today also.  LOS: 2 days   Nasif Bos 10/20/2011, 9:48 AM  CXR clear, no pneumonia or PTX. She is feeling much better. Will plan to reassess in the am and remove drain and  discharge home if doing better.

## 2011-10-21 MED ORDER — HYDROCODONE-ACETAMINOPHEN 5-325 MG PO TABS: 1.0000 | ORAL_TABLET | ORAL | Status: AC | PRN

## 2011-10-21 NOTE — Discharge Summary (Signed)
  Physician Discharge Summary  Patient ID: Valerie [name] MRN: [id number] DOB/AGE: [birth year] 66 y.o.  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 *    Discharged Condition: good  Hospital Course: Post op weakness and coughing up large amounts of mucous otherwise no complications.  Consults: None  Significant Diagnostic Studies: radiology: CXR: no changes   Treatments: surgery: neck dissection and thyroidectomy  Discharge Exam: Blood pressure 119/71, pulse 81, temperature 97.8 F (36.6 C), temperature source Oral, resp. rate 18, height 5\' 9"  (1.753 m), weight 95.7 kg (210 lb 15.7 oz), SpO2 96.00%. PHYSICAL EXAM: Incision intact without any swelling, voice normal, no trouble swallowing.  Disposition:   Discharge Orders    Future Appointments: Provider: Department: Dept Phone: Center:   10/30/2011 9:15 AM Krista Blue [facility] [phone] None   10/30/2011 9:45 AM Mohamed K. Arbutus Ped, MD [facility] [phone] None     Future Orders Please Complete By Expires   Diet - low sodium heart healthy      Increase activity slowly        Medication List  As of 10/21/2011  9:21 AM   TAKE these medications         alendronate 70 MG tablet   Commonly known as: FOSAMAX   Take 70 mg by mouth every 7 (seven) days. Saturdays; Take with a full glass of water on an empty stomach.      aspirin EC 81 MG tablet   Take 81 mg by mouth every other day.      donepezil 10 MG tablet   Commonly known as: ARICEPT   Take 10 mg by mouth at bedtime.      erlotinib 150 MG tablet   Commonly known as: TARCEVA   Take 150 mg by mouth at bedtime. Take on an empty stomach 1 hour before meals or 2 hours after.      HYDROcodone-acetaminophen 5-325 MG per tablet   Commonly known as: NORCO   Take 1-2 tablets by mouth every 4 (four) hours as needed.        mulitivitamin with minerals Tabs   Take 1 tablet by mouth daily.      naphazoline-pheniramine 0.025-0.3 % ophthalmic solution   Commonly known as: NAPHCON-A   Place 1 drop into both eyes 4 (four) times daily as needed. For dry eyes      VITAMIN C PO   Take 1 tablet by mouth daily.      Vitamin D3 2000 UNITS Tabs   Take 2,000 Int'l Units by mouth daily.           Follow-up Information    Follow up with Serena Colonel, MD. Call in 5 days.   Contact information:   [address], [address] [place] [address] [phone]          Signed: Serena Colonel 10/21/2011, 9:21 AM

## 2011-10-22 LAB — CULTURE, RESPIRATORY: Culture: NORMAL

## 2011-10-22 LAB — CULTURE, RESPIRATORY W GRAM STAIN

## 2011-10-30 ENCOUNTER — Ambulatory Visit: Payer: BC Managed Care – PPO | Admitting: Internal Medicine

## 2011-10-30 ENCOUNTER — Other Ambulatory Visit: Payer: BC Managed Care – PPO | Admitting: Lab

## 2011-11-01 ENCOUNTER — Telehealth: Payer: Self-pay | Admitting: Internal Medicine

## 2011-11-01 ENCOUNTER — Other Ambulatory Visit: Payer: Self-pay | Admitting: *Deleted

## 2011-11-01 NOTE — Telephone Encounter (Signed)
S/w pt re appt for 3/21 @ 11:45 am.

## 2011-11-07 ENCOUNTER — Other Ambulatory Visit: Payer: Self-pay | Admitting: *Deleted

## 2011-11-07 ENCOUNTER — Telehealth: Payer: Self-pay | Admitting: Internal Medicine

## 2011-11-07 DIAGNOSIS — C3490 Malignant neoplasm of unspecified part of unspecified bronchus or lung: Secondary | ICD-10-CM

## 2011-11-07 NOTE — Telephone Encounter (Signed)
called pt with lab appt for 3/21   aom

## 2011-11-08 ENCOUNTER — Ambulatory Visit (HOSPITAL_BASED_OUTPATIENT_CLINIC_OR_DEPARTMENT_OTHER): Payer: Medicare Other | Admitting: Internal Medicine

## 2011-11-08 ENCOUNTER — Encounter: Payer: Self-pay | Admitting: *Deleted

## 2011-11-08 ENCOUNTER — Other Ambulatory Visit (HOSPITAL_BASED_OUTPATIENT_CLINIC_OR_DEPARTMENT_OTHER): Payer: Medicare Other | Admitting: Lab

## 2011-11-08 ENCOUNTER — Telehealth: Payer: Self-pay | Admitting: Internal Medicine

## 2011-11-08 VITALS — BP 127/92 | HR 83 | Temp 97.0°F | Ht 69.0 in | Wt 207.2 lb

## 2011-11-08 DIAGNOSIS — C7949 Secondary malignant neoplasm of other parts of nervous system: Secondary | ICD-10-CM | POA: Diagnosis not present

## 2011-11-08 DIAGNOSIS — C7931 Secondary malignant neoplasm of brain: Secondary | ICD-10-CM | POA: Diagnosis not present

## 2011-11-08 DIAGNOSIS — C3490 Malignant neoplasm of unspecified part of unspecified bronchus or lung: Secondary | ICD-10-CM | POA: Diagnosis not present

## 2011-11-08 DIAGNOSIS — C3430 Malignant neoplasm of lower lobe, unspecified bronchus or lung: Secondary | ICD-10-CM | POA: Diagnosis not present

## 2011-11-08 DIAGNOSIS — C770 Secondary and unspecified malignant neoplasm of lymph nodes of head, face and neck: Secondary | ICD-10-CM

## 2011-11-08 LAB — CBC WITH DIFFERENTIAL/PLATELET
BASO%: 0.3 % (ref 0.0–2.0)
Basophils Absolute: 0 10*3/uL (ref 0.0–0.1)
EOS%: 2.5 % (ref 0.0–7.0)
Eosinophils Absolute: 0.1 10*3/uL (ref 0.0–0.5)
HCT: 41.8 % (ref 34.8–46.6)
HGB: 13.6 g/dL (ref 11.6–15.9)
LYMPH%: 24.3 % (ref 14.0–49.7)
MCH: 29.5 pg (ref 25.1–34.0)
MCHC: 32.5 g/dL (ref 31.5–36.0)
MCV: 90.7 fL (ref 79.5–101.0)
MONO#: 0.2 10*3/uL (ref 0.1–0.9)
MONO%: 6 % (ref 0.0–14.0)
NEUT#: 2.5 10*3/uL (ref 1.5–6.5)
NEUT%: 66.9 % (ref 38.4–76.8)
Platelets: 273 10*3/uL (ref 145–400)
RBC: 4.61 10*6/uL (ref 3.70–5.45)
RDW: 14 % (ref 11.2–14.5)
WBC: 3.7 10*3/uL — ABNORMAL LOW (ref 3.9–10.3)
lymph#: 0.9 10*3/uL (ref 0.9–3.3)
nRBC: 0 % (ref 0–0)

## 2011-11-08 LAB — COMPREHENSIVE METABOLIC PANEL
ALT: 14 U/L (ref 0–35)
AST: 16 U/L (ref 0–37)
Albumin: 4.6 g/dL (ref 3.5–5.2)
Alkaline Phosphatase: 76 U/L (ref 39–117)
BUN: 11 mg/dL (ref 6–23)
CO2: 26 mEq/L (ref 19–32)
Calcium: 9.9 mg/dL (ref 8.4–10.5)
Chloride: 103 mEq/L (ref 96–112)
Creatinine, Ser: 0.93 mg/dL (ref 0.50–1.10)
Glucose, Bld: 127 mg/dL — ABNORMAL HIGH (ref 70–99)
Potassium: 3.6 mEq/L (ref 3.5–5.3)
Sodium: 140 mEq/L (ref 135–145)
Total Bilirubin: 1.1 mg/dL (ref 0.3–1.2)
Total Protein: 6.8 g/dL (ref 6.0–8.3)

## 2011-11-08 NOTE — Progress Notes (Signed)
ON 11/02/11 RECEIVED A FAX FROM [facility] CONCERNING A CONFIRMATION OF PRESCRIPTION SHIPMENT FOR TARCEVA. 

## 2011-11-08 NOTE — Telephone Encounter (Signed)
appts made and printed for pt aom °

## 2011-11-10 NOTE — Progress Notes (Signed)
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   PRIOR THERAPY:  1. Status post left lower lobectomy under the care of Dr Edwyna Shell in October 2002. 2. Status post course of concurrent chemoradiation with weekly carboplatin and paclitaxel under the care of Dr Truett Perna and Dr Dorna Bloom in early 2003. 3. Status post treatment with Celebrex and Iressa according to the clinical trial at [facility] for a total of 1 year. 4. Status post left occipital craniotomy for tumor resection on September 21, 2005, under the care of Dr Franky Macho. 5. Status post whole brain irradiation under the care of Dr Dorna Bloom, completed November 07, 2005. 6. Status post gamma knife treatment to the resected cavitary recurrence on June 02, 2008, at [facility]. 7. Status post right thyroidectomy with radical neck dissection under the care of Dr. Pollyann Kennedy on 10/18/2011.  CURRENT THERAPY: Tarceva 150 mg by mouth daily with therapy beginning 06/03/2007.   INTERVAL HISTORY: Valerie [name] 66 y.o. female returns to the clinic today for followup visit. The patient underwent thyroidectomy with right radical neck dissection. 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 The right radical neck dissection showed one of 6 lymph nodes was positive for metastatic adenocarcinoma. The patient is recovering well from her surgery. She denied having any significant complaints today. She has no chest pain or shortness of breath. No significant weight loss or night sweats. She still on treatment with oral Tarceva and tolerating it fairly well with no significant skin rash or diarrhea.  MEDICAL HISTORY: Past Medical History  Diagnosis Date    . Heart murmur   . Shortness of breath     hx lung ca   . Thyroid adenoma     ?  . Lung cancer     lung ca dx 2002  . Arthritis     ALLERGIES:   has no known allergies.  MEDICATIONS:  Current Outpatient Prescriptions  Medication Sig Dispense Refill  . alendronate (FOSAMAX) 70 MG tablet Take 70 mg by mouth every 7 (seven) days. Saturdays; Take with a full glass of water on an empty stomach.      . Ascorbic Acid (VITAMIN C PO) Take 1 tablet by mouth daily.      [name] aspirin EC 81 MG tablet Take 81 mg by mouth every other day.       . Cholecalciferol (VITAMIN D3) 2000 UNITS TABS Take 2,000 Int'l Units by mouth daily.       [name] donepezil (ARICEPT) 10 MG tablet Take 10 mg by mouth at bedtime.       . erlotinib (TARCEVA) 150 MG tablet Take 150 mg by mouth at bedtime. Take on an empty stomach 1 hour before meals or 2 hours after.      . Multiple Vitamin (MULITIVITAMIN WITH MINERALS) TABS Take 1 tablet by mouth daily.      . naphazoline-pheniramine (NAPHCON-A) 0.025-0.3 % ophthalmic solution Place 1 drop into both eyes 4 (four) times daily as needed. For dry eyes        SURGICAL HISTORY:  Past Surgical History  Procedure Date  . Brain surgery     tumor  . Lung lobectomy     left ca  . Radical neck dissection 10/18/2011  Procedure: RADICAL NECK DISSECTION;  Surgeon: Serena Colonel, MD;  Location: [facility] OR;  Service: ENT;  Laterality: Right;  RIGHT MODIFIED NECK DISSECTION /POSSIBLE RIGHT THYROIDECTOM  . Thyroidectomy 10/18/2011    Procedure: THYROIDECTOMY;  Surgeon: Serena Colonel, MD;  Location: [facility] OR;  Service: ENT;  Laterality: Right;    REVIEW OF SYSTEMS:  A comprehensive review of systems was negative.   PHYSICAL EXAMINATION: General appearance: alert, cooperative and no distress Neck: no adenopathy Lymph nodes: Cervical, supraclavicular, and axillary nodes normal. Resp: clear to auscultation bilaterally Cardio: regular rate and rhythm, S1, S2 normal, no murmur, click, rub or gallop GI:  soft, non-tender; bowel sounds normal; no masses,  no organomegaly Extremities: extremities normal, atraumatic, no cyanosis or edema Neurologic: Alert and oriented X 3, normal strength and tone. Normal symmetric reflexes. Normal coordination and gait  ECOG PERFORMANCE STATUS: 0 - Asymptomatic  Blood pressure 127/92, pulse 83, temperature 97 F (36.1 C), temperature source Oral, height 5\' 9"  (1.753 m), weight 207 lb 3.2 oz (93.985 kg).  LABORATORY DATA: Lab Results  Component Value Date   WBC 3.7* 11/08/2011   HGB 13.6 11/08/2011   HCT 41.8 11/08/2011   MCV 90.7 11/08/2011   PLT 273 11/08/2011      Chemistry      Component Value Date/Time   NA 140 11/08/2011 1106   NA 144 05/17/2011 0728   K 3.6 11/08/2011 1106   K 3.9 05/17/2011 0728   CL 103 11/08/2011 1106   CL 101 05/17/2011 0728   CO2 26 11/08/2011 1106   CO2 26 05/17/2011 0728   BUN 11 11/08/2011 1106   BUN 13 05/17/2011 0728   CREATININE 0.93 11/08/2011 1106   CREATININE 0.9 05/17/2011 0728      Component Value Date/Time   CALCIUM 9.9 11/08/2011 1106   CALCIUM 9.1 05/17/2011 0728   ALKPHOS 76 11/08/2011 1106   ALKPHOS 64 05/17/2011 0728   AST 16 11/08/2011 1106   AST 25 05/17/2011 0728   ALT 14 11/08/2011 1106   BILITOT 1.1 11/08/2011 1106   BILITOT 1.40 05/17/2011 0728       RADIOGRAPHIC STUDIES: Dg Chest 2 View  10/20/2011  *RADIOLOGY REPORT*  Clinical Data: Wheezing, postop.  CHEST - 2 VIEW  Comparison: Chest radiograph 10/16/2011  Findings: There is tubing overlying the right hemithorax with tip projecting over the right lung apex.  Surgical skin staples project over the right lung apex.  No evidence of pneumothorax on the right. No evidence of pneumonia on the right.  There is postsurgical change in the left hemithorax which is unchanged.  IMPRESSION:  1.  No complication following right neck surgery. 2.  Stable postop change in the left hemithorax.  Original Report Authenticated By: Genevive Bi, M.D.   Dg Chest 2  View  10/16/2011  *RADIOLOGY REPORT*  Clinical Data: Preop  CHEST - 2 VIEW  Comparison: 08/20/2011  Findings: Cardiomediastinal silhouette is stable.  Again noted status post left lower lobectomy.  Stable surgical clips in the left hilum.  Stable left perihilar postradiation changes.  No acute infiltrate or pleural effusion.  No pulmonary edema. Mild degenerative changes mid thoracic spine.  IMPRESSION: No active disease.  Stable postsurgical and postradiation changes left lung.  Original Report Authenticated By: Natasha Mead, M.D.    ASSESSMENT: This is a very pleasant 66 years old African American female with recurrent non-small cell lung cancer recently with metastatic disease to the right cervical lymph nodes status post radical right lymph node dissection.  The patient is doing fine and recovering well from her surgery.   PLAN: The patient is currently asymptomatic. She will continue on her treatment with Tarceva at the current dose 150 mg by mouth daily. I would see her back for followup visit in one month with repeat CT scan of the neck, chest, abdomen and pelvis for restaging of her disease. The patient was advised to call me immediately if she has any concerning symptoms in the interval   All questions were answered. The patient knows to call the clinic with any problems, questions or concerns. We can certainly see the patient much sooner if necessary.  I spent 20 minutes counseling the patient face to face. The total time spent in the appointment was 30 minutes.

## 2011-11-26 IMAGING — CT CT ABD-PELV W/ CM
2 of 5 series · 16 of 46 positions shown, 18 images · IV contrast (agent unspecified)
Comparison: 09/02/2009.

CT CHEST

CLINICAL DATA: Lung cancer 4224 and 3664.  Chemotherapy and
radiation therapy completed.  History of left lower lobectomy.

CT CHEST, ABDOMEN AND PELVIS WITH CONTRAST
TECHNIQUE: Multidetector CT imaging of the chest, abdomen and
pelvis was performed following the standard protocol during bolus
administration of intravenous contrast.
Contrast: 100 ml Dmnipaque-HXX.

[Series 2: cap with st · axial · 0.73mm/px · z∈[-525,+70]mm · 13 of 135 slices shown, 15 images]
[im 8/135  soft-tissue]
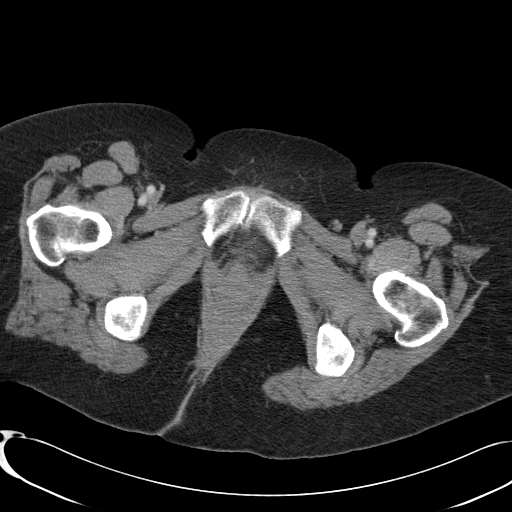
[im 8/135  bone]
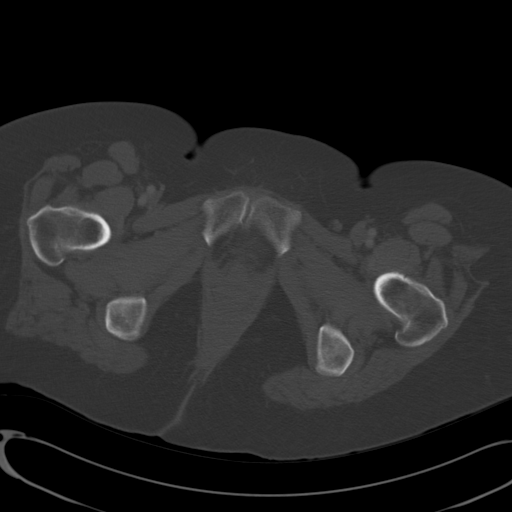
[im 15/135  soft-tissue]
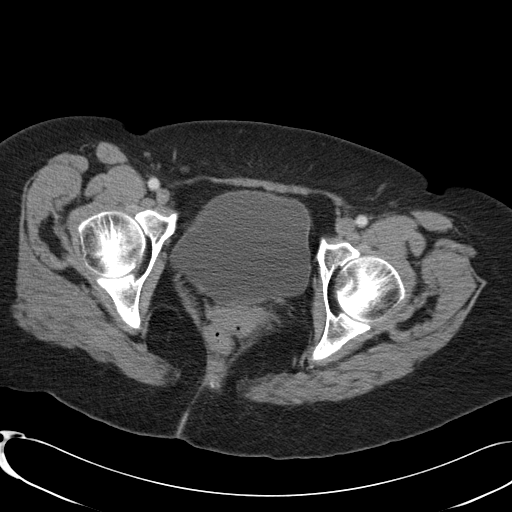
[im 30/135  soft-tissue]
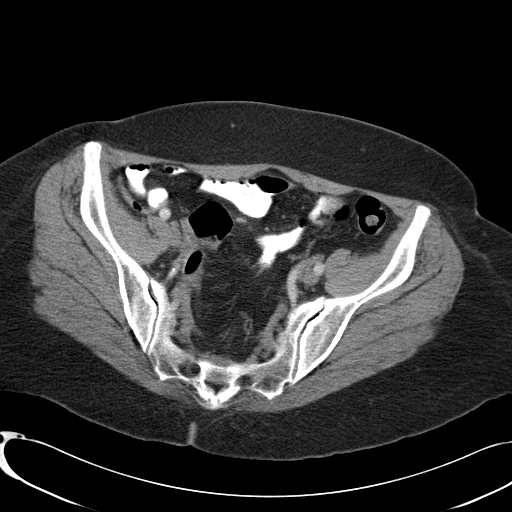
[im 38/135  soft-tissue]
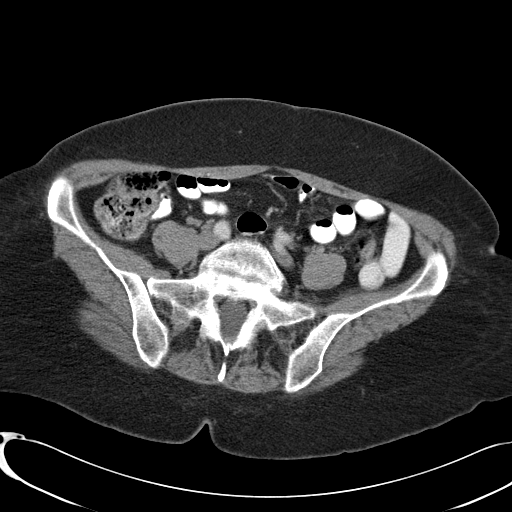
[im 45/135  soft-tissue]
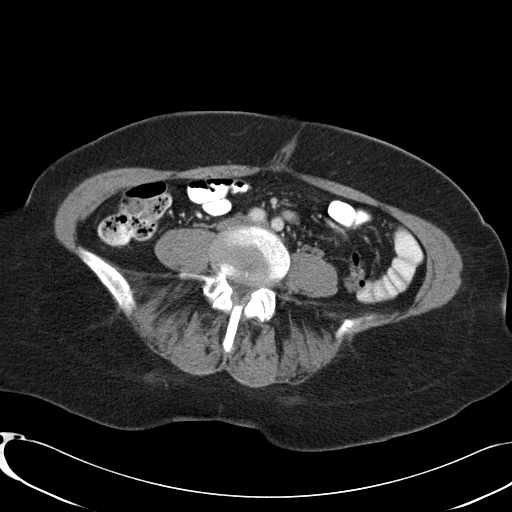
[im 60/135  soft-tissue]
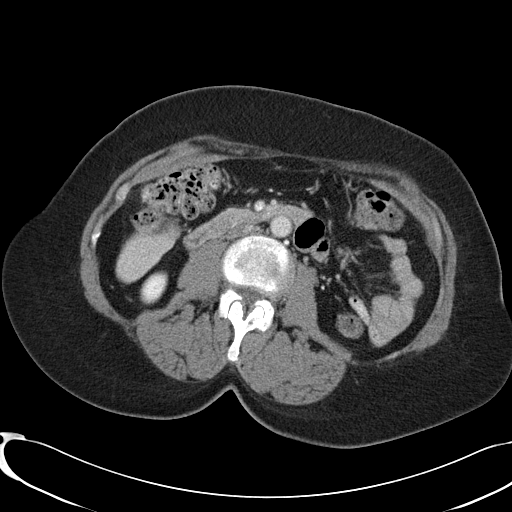
[im 68/135  soft-tissue]
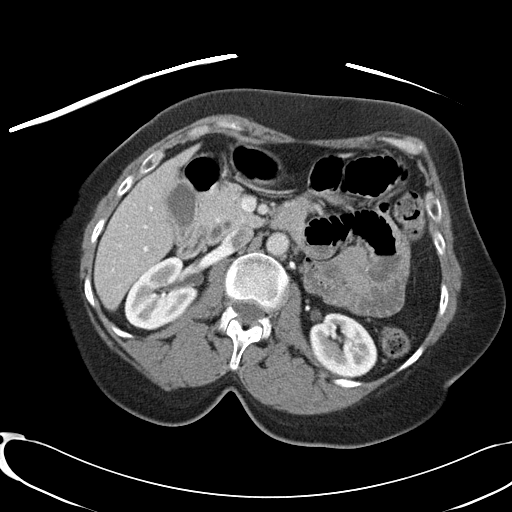
[im 75/135  soft-tissue]
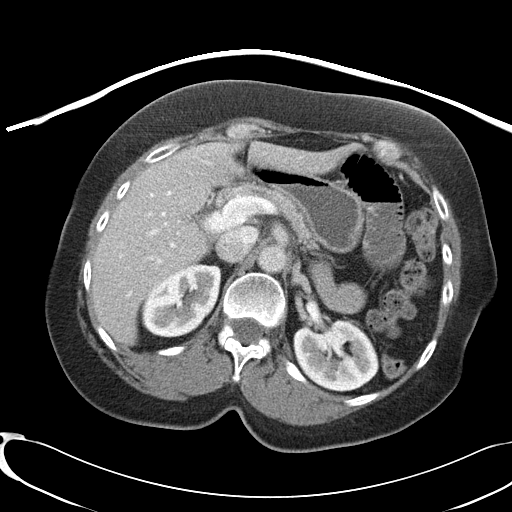
[im 90/135  soft-tissue]
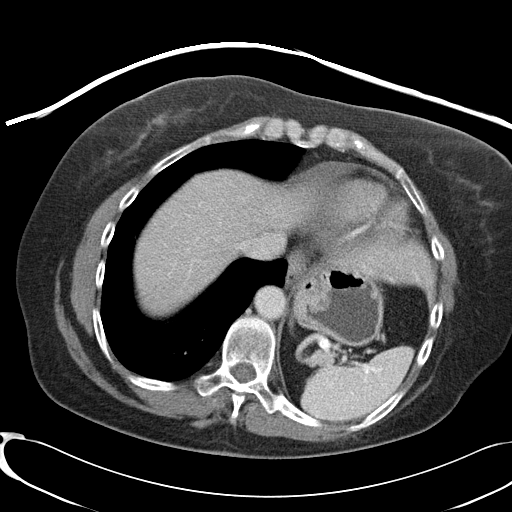
[im 90/135  bone]
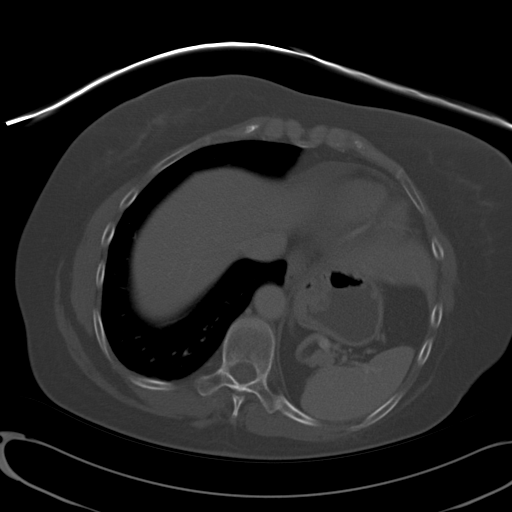
[im 97/135  soft-tissue]
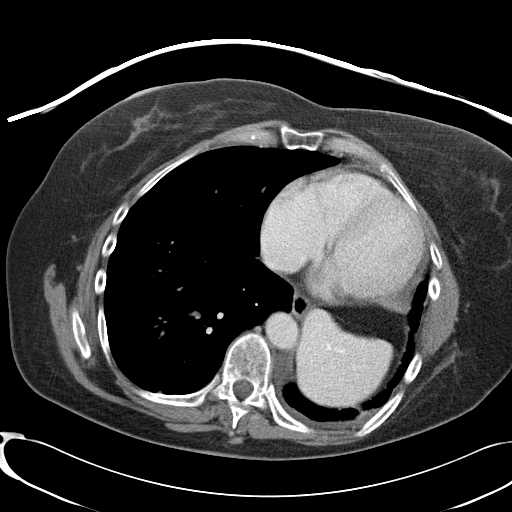
[im 105/135  soft-tissue]
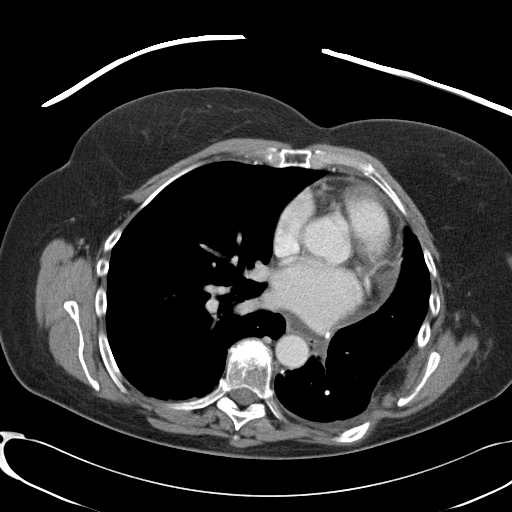
[im 120/135  soft-tissue]
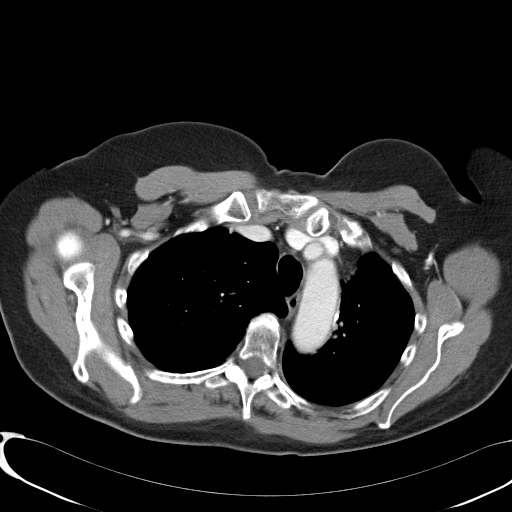
[im 127/135  soft-tissue]
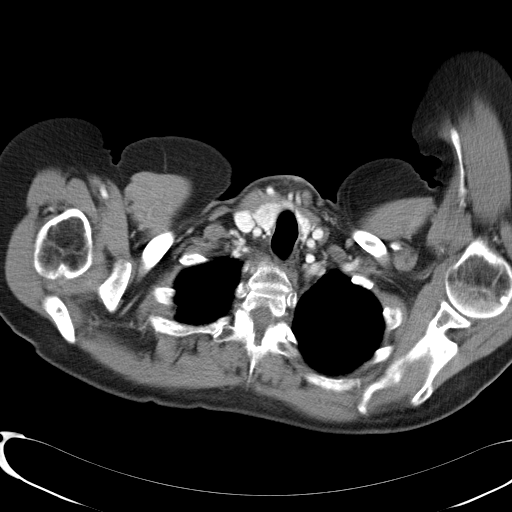

[Series 602: coronal images · coronal · 1.32mm/px · 3 of 87 slices shown]
[im 29/87  soft-tissue]
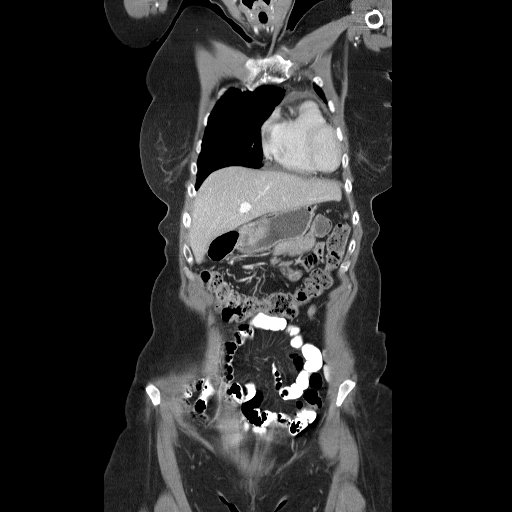
[im 39/87  soft-tissue]
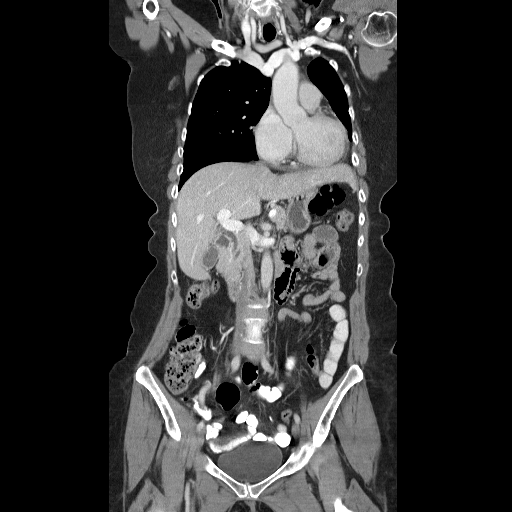
[im 48/87  soft-tissue]
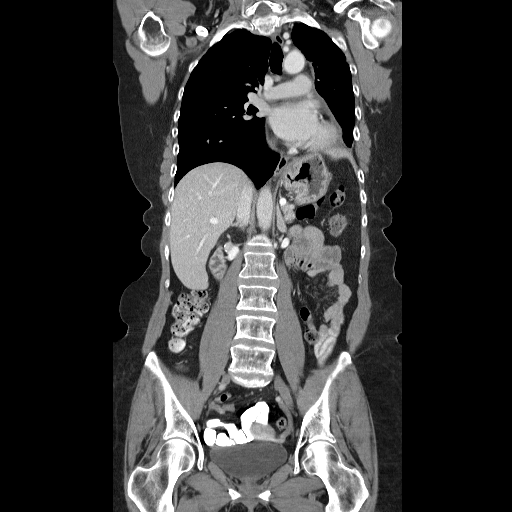

[16 of 46 positions shown; findings below may reference images not displayed]

FINDINGS: Heterogeneous appearance of thyroid gland with calcified
lesion right lobe unchanged.

Status post left lower lobectomy.  Mediastinal shift to the left.
Regions of postoperative scarring appear similar to that of prior
exam.

Left lobe 3.2 mm nodule (series 5 image 22) unchanged.  Peripheral
right lower lobe nodule measures 4.6 mm (series 5 image 28) versus
of prior 3.5 mm.   Pleural thickening posterior right lower lobe
appears slightly more prominent.  Attention to these areas on
follow-up.  No mediastinal, hilar or axillary adenopathy. Thoracic
kyphosis with degenerative changes.  No bony destructive lesion.
IMPRESSION: Right lower lobe peripheral based nodule appears minimally more
prominent as does right lower lobe posteriorly located pleural
thickening.  Attention to this region on follow-up.  Remainder of
findings without change.

CT ABDOMEN AND PELVIS
FINDINGS: 6.6 mm lesion within the anterior aspect left lobe liver
(series 2 image 56).  This was not seen on the 11/24/2008 exam and
is barely perceptible on the 09/02/2009 exam.  No focal renal,
splenic, pancreatic or adrenal lesion.

No calcified gallstone.  Mild prominent enhancement/slight
thickening gallbladder wall noted on several prior exams.

No abdominal aortic aneurysm.  No retroperitoneal or mesenteric
adenopathy.

Lobulated low density appearance of the uterine fundus suggestive
of fibroids.  Question slight prominence of the endometrial lining.
This can be evaluated with pelvic ultrasound.
IMPRESSION: 6.6 mm liver lesion.  This was not seen on the 11/24/2008 exam and
is barely perceptible in retrospect on the most recent CT and
therefore may represent a small metastatic focus.

Uterine fibroid and suggestion of slight prominence of the
endometrial lining.  Pelvic ultrasound can be obtained further
delineation.

## 2011-11-27 DIAGNOSIS — Z79899 Other long term (current) drug therapy: Secondary | ICD-10-CM | POA: Diagnosis not present

## 2011-11-27 DIAGNOSIS — K921 Melena: Secondary | ICD-10-CM | POA: Diagnosis not present

## 2011-11-27 DIAGNOSIS — Z85118 Personal history of other malignant neoplasm of bronchus and lung: Secondary | ICD-10-CM | POA: Diagnosis not present

## 2011-11-27 DIAGNOSIS — R413 Other amnesia: Secondary | ICD-10-CM | POA: Diagnosis not present

## 2011-11-30 ENCOUNTER — Other Ambulatory Visit: Payer: Self-pay | Admitting: *Deleted

## 2011-11-30 ENCOUNTER — Telehealth: Payer: Self-pay | Admitting: Medical Oncology

## 2011-11-30 DIAGNOSIS — C3430 Malignant neoplasm of lower lobe, unspecified bronchus or lung: Secondary | ICD-10-CM

## 2011-11-30 MED ORDER — ERLOTINIB HCL 150 MG PO TABS: 150.0000 mg | ORAL_TABLET | Freq: Every day | ORAL | Status: DC

## 2011-11-30 NOTE — Telephone Encounter (Signed)
Valerie [name] called pt to set up delivery and pt says she was going to have labs, ct and see dr Donnald Garre and he may change therapy. She is not sure if she should get another shipment . After our conversation she decided to go ahead and get more tarceva.SHe said she will call [facility] to set up delivery.

## 2011-11-30 NOTE — Telephone Encounter (Signed)
Addended by: Arvilla Meres on: 11/30/2011 09:53 AM   Modules accepted: Orders

## 2011-11-30 NOTE — Telephone Encounter (Signed)
THE REFILL REQUEST FOR TARCEVA WAS GIVEN TO DR.MOHAMED'S NURSE, DIANE BELL,RN.

## 2011-11-30 NOTE — Telephone Encounter (Signed)
Clarification of tarceva quantity which was written for #150. Insurance will only allow 30 day supply. I called back and clarified quantity  should be  #30 tablets. They received another rx with quantity of 30.

## 2011-12-03 NOTE — Telephone Encounter (Signed)
RECEIVED A FAX FROM [facility] CONCERNING A CONFIRMATION OF PRESCRIPTION SHIPMENT FOR TARCEVA. 

## 2011-12-10 ENCOUNTER — Encounter (HOSPITAL_COMMUNITY): Payer: Self-pay

## 2011-12-10 ENCOUNTER — Other Ambulatory Visit (HOSPITAL_COMMUNITY): Payer: BC Managed Care – PPO

## 2011-12-10 ENCOUNTER — Other Ambulatory Visit (HOSPITAL_BASED_OUTPATIENT_CLINIC_OR_DEPARTMENT_OTHER): Payer: Medicare Other | Admitting: Lab

## 2011-12-10 ENCOUNTER — Other Ambulatory Visit: Payer: Self-pay | Admitting: Internal Medicine

## 2011-12-10 ENCOUNTER — Ambulatory Visit (HOSPITAL_COMMUNITY)
Admission: RE | Admit: 2011-12-10 | Discharge: 2011-12-10 | Disposition: A | Discharge status: Home / Self Care | Payer: Medicare Other | Source: Ambulatory Visit | Attending: Internal Medicine | Admitting: Internal Medicine

## 2011-12-10 DIAGNOSIS — C3490 Malignant neoplasm of unspecified part of unspecified bronchus or lung: Secondary | ICD-10-CM | POA: Insufficient documentation

## 2011-12-10 DIAGNOSIS — R4701 Aphasia: Secondary | ICD-10-CM | POA: Insufficient documentation

## 2011-12-10 DIAGNOSIS — G319 Degenerative disease of nervous system, unspecified: Secondary | ICD-10-CM | POA: Diagnosis not present

## 2011-12-10 DIAGNOSIS — C7931 Secondary malignant neoplasm of brain: Secondary | ICD-10-CM | POA: Diagnosis not present

## 2011-12-10 DIAGNOSIS — Z79899 Other long term (current) drug therapy: Secondary | ICD-10-CM | POA: Insufficient documentation

## 2011-12-10 DIAGNOSIS — C770 Secondary and unspecified malignant neoplasm of lymph nodes of head, face and neck: Secondary | ICD-10-CM | POA: Diagnosis not present

## 2011-12-10 DIAGNOSIS — Z09 Encounter for follow-up examination after completed treatment for conditions other than malignant neoplasm: Secondary | ICD-10-CM | POA: Diagnosis not present

## 2011-12-10 DIAGNOSIS — Z902 Acquired absence of lung [part of]: Secondary | ICD-10-CM | POA: Diagnosis not present

## 2011-12-10 DIAGNOSIS — C7949 Secondary malignant neoplasm of other parts of nervous system: Secondary | ICD-10-CM | POA: Insufficient documentation

## 2011-12-10 DIAGNOSIS — Z923 Personal history of irradiation: Secondary | ICD-10-CM | POA: Insufficient documentation

## 2011-12-10 DIAGNOSIS — C3430 Malignant neoplasm of lower lobe, unspecified bronchus or lung: Secondary | ICD-10-CM | POA: Diagnosis not present

## 2011-12-10 DIAGNOSIS — D259 Leiomyoma of uterus, unspecified: Secondary | ICD-10-CM | POA: Insufficient documentation

## 2011-12-10 DIAGNOSIS — R911 Solitary pulmonary nodule: Secondary | ICD-10-CM | POA: Insufficient documentation

## 2011-12-10 HISTORY — DX: Secondary and unspecified malignant neoplasm of lymph node, unspecified: C77.9

## 2011-12-10 HISTORY — DX: Secondary malignant neoplasm of brain: C79.31

## 2011-12-10 LAB — CBC WITH DIFFERENTIAL/PLATELET
BASO%: 0.5 % (ref 0.0–2.0)
Basophils Absolute: 0 10*3/uL (ref 0.0–0.1)
EOS%: 3.7 % (ref 0.0–7.0)
Eosinophils Absolute: 0.1 10*3/uL (ref 0.0–0.5)
HCT: 40.7 % (ref 34.8–46.6)
HGB: 13.3 g/dL (ref 11.6–15.9)
LYMPH%: 19.1 % (ref 14.0–49.7)
MCH: 30.4 pg (ref 25.1–34.0)
MCHC: 32.6 g/dL (ref 31.5–36.0)
MCV: 93.4 fL (ref 79.5–101.0)
MONO#: 0.3 10*3/uL (ref 0.1–0.9)
MONO%: 8.4 % (ref 0.0–14.0)
NEUT#: 2.6 10*3/uL (ref 1.5–6.5)
NEUT%: 68.3 % (ref 38.4–76.8)
Platelets: 219 10*3/uL (ref 145–400)
RBC: 4.36 10*6/uL (ref 3.70–5.45)
RDW: 14.2 % (ref 11.2–14.5)
WBC: 3.8 10*3/uL — ABNORMAL LOW (ref 3.9–10.3)
lymph#: 0.7 10*3/uL — ABNORMAL LOW (ref 0.9–3.3)
nRBC: 0 % (ref 0–0)

## 2011-12-10 LAB — CMP (CANCER CENTER ONLY)
ALT(SGPT): 13 U/L (ref 10–47)
AST: 20 U/L (ref 11–38)
Albumin: 3.8 g/dL (ref 3.3–5.5)
Alkaline Phosphatase: 64 U/L (ref 26–84)
BUN, Bld: 10 mg/dL (ref 7–22)
CO2: 28 mEq/L (ref 18–33)
Calcium: 8.6 mg/dL (ref 8.0–10.3)
Chloride: 100 mEq/L (ref 98–108)
Creat: 1.1 mg/dl (ref 0.6–1.2)
Glucose, Bld: 99 mg/dL (ref 73–118)
Potassium: 3.7 mEq/L (ref 3.3–4.7)
Sodium: 140 mEq/L (ref 128–145)
Total Bilirubin: 1.2 mg/dl (ref 0.20–1.60)
Total Protein: 7.1 g/dL (ref 6.4–8.1)

## 2011-12-10 MED ORDER — IOHEXOL 300 MG/ML  SOLN
125.0000 mL | Freq: Once | INTRAMUSCULAR | Status: AC | PRN
  Administered 2011-12-10: 125 mL via INTRAVENOUS

## 2011-12-11 ENCOUNTER — Telehealth: Payer: Self-pay | Admitting: Obstetrics and Gynecology

## 2011-12-11 NOTE — Telephone Encounter (Signed)
Routed to chandra 

## 2011-12-11 NOTE — Telephone Encounter (Signed)
TC TO PT PER TELEPHONE CALL. TOLD PT COLONOSCOPY DONE 04/26/2009 BY DR.DAVID PATTERSON([facility]). PT AGREES.

## 2011-12-12 ENCOUNTER — Telehealth: Payer: Self-pay | Admitting: Internal Medicine

## 2011-12-12 ENCOUNTER — Ambulatory Visit (HOSPITAL_BASED_OUTPATIENT_CLINIC_OR_DEPARTMENT_OTHER): Payer: Medicare Other | Admitting: Internal Medicine

## 2011-12-12 VITALS — BP 115/80 | HR 95 | Temp 97.4°F | Ht 69.0 in | Wt 207.4 lb

## 2011-12-12 DIAGNOSIS — C3490 Malignant neoplasm of unspecified part of unspecified bronchus or lung: Secondary | ICD-10-CM

## 2011-12-12 NOTE — Telephone Encounter (Signed)
appts made and printed for pt aom °

## 2011-12-12 NOTE — Progress Notes (Signed)
[facility] Telephone:([phone]) [phone]   [phone]  OFFICE PROGRESS NOTE  [name], MD, MD 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   PRIOR THERAPY:  1. Status post left lower lobectomy under the care of Dr Edwyna Shell in October 2002. 2. Status post course of concurrent chemoradiation with weekly carboplatin and paclitaxel under the care of Dr Truett Perna and Dr Dorna Bloom in early 2003. 3. Status post treatment with Celebrex and Iressa according to the clinical trial at [facility] for a total of 1 year. 4. Status post left occipital craniotomy for tumor resection on September 21, 2005, under the care of Dr Franky Macho. 5. Status post whole brain irradiation under the care of Dr Dorna Bloom, completed November 07, 2005. 6. Status post gamma knife treatment to the resected cavitary recurrence on June 02, 2008, at [facility]. 7. Status post right thyroidectomy with radical neck dissection under the care of Dr. Pollyann Kennedy on 10/18/2011.  CURRENT THERAPY: Tarceva 150 mg by mouth daily with therapy beginning 06/03/2007.   INTERVAL HISTORY: Valerie [name] 66 y.o. female returns to the clinic today for followup visit accompanied by her husband. She is feeling fine today with no specific complaints except for a few episodes of diarrhea depending on the type of food she eats. She denied having any significant skin rash. The patient denied having any significant weight loss or night sweats. She has no chest pain or shortness of breath, no cough or hemoptysis. She is tolerating her treatment with Tarceva fairly well. She has repeat CT scan of the head, neck, chest, abdomen and pelvis performed recently and she is here today for evaluation and discussion of her scan results.  MEDICAL HISTORY: Past Medical History  Diagnosis Date    . Heart murmur   . Shortness of breath     hx lung ca   . Thyroid adenoma     ?  [name] Arthritis   . Lung cancer dx'd 2002  . Metastasis to brain dx'd 2008  . Metastasis to lymph nodes dx'd 09/2011    ALLERGIES:   has no known allergies.  MEDICATIONS:  Current Outpatient Prescriptions  Medication Sig Dispense Refill  . alendronate (FOSAMAX) 70 MG tablet Take 70 mg by mouth every 7 (seven) days. Saturdays; Take with a full glass of water on an empty stomach.      . Ascorbic Acid (VITAMIN C PO) Take 1 tablet by mouth daily.      [name] aspirin EC 81 MG tablet Take 81 mg by mouth every other day.       . Cholecalciferol (VITAMIN D3) 2000 UNITS TABS Take 2,000 Int'l Units by mouth daily.       [name] donepezil (ARICEPT) 10 MG tablet Take 10 mg by mouth at bedtime.       . erlotinib (TARCEVA) 150 MG tablet Take 1 tablet (150 mg total) by mouth at bedtime. Take on an empty stomach 1 hour before meals or 2 hours after.  150 tablet  0  . Multiple Vitamin (MULITIVITAMIN WITH MINERALS) TABS Take 1 tablet by mouth daily.      . naphazoline-pheniramine (NAPHCON-A) 0.025-0.3 % ophthalmic solution Place 1 drop into both eyes 4 (four) times daily as needed. For dry eyes        SURGICAL HISTORY:  Past Surgical History  Procedure Date  .  Brain surgery     tumor  . Lung lobectomy     left ca  . Radical neck dissection 10/18/2011    Procedure: RADICAL NECK DISSECTION;  Surgeon: Serena Colonel, MD;  Location: [facility];  Service: ENT;  Laterality: Right;  RIGHT MODIFIED NECK DISSECTION /POSSIBLE RIGHT THYROIDECTOM  . Thyroidectomy 10/18/2011    Procedure: THYROIDECTOMY;  Surgeon: Serena Colonel, MD;  Location: [facility] OR;  Service: ENT;  Laterality: Right;    REVIEW OF SYSTEMS:  A comprehensive review of systems was negative except for: Gastrointestinal: positive for diarrhea   PHYSICAL EXAMINATION: General appearance: alert, cooperative and no distress Head: Normocephalic, without obvious abnormality, atraumatic Neck: no  adenopathy Lymph nodes: Cervical, supraclavicular, and axillary nodes normal. Resp: clear to auscultation bilaterally Cardio: regular rate and rhythm, S1, S2 normal, no murmur, click, rub or gallop GI: soft, non-tender; bowel sounds normal; no masses,  no organomegaly Extremities: extremities normal, atraumatic, no cyanosis or edema Neurologic: Alert and oriented X 3, normal strength and tone. Normal symmetric reflexes. Normal coordination and gait  ECOG PERFORMANCE STATUS: 1 - Symptomatic but completely ambulatory  Blood pressure 115/80, pulse 95, temperature 97.4 F (36.3 C), temperature source Oral, height 5\' 9"  (1.753 m), weight 207 lb 6.4 oz (94.076 kg).  LABORATORY DATA: Lab Results  Component Value Date   WBC 3.8* 12/10/2011   HGB 13.3 12/10/2011   HCT 40.7 12/10/2011   MCV 93.4 12/10/2011   PLT 219 12/10/2011      Chemistry      Component Value Date/Time   NA 140 12/10/2011 0824   NA 140 11/08/2011 1106   K 3.7 12/10/2011 0824   K 3.6 11/08/2011 1106   CL 100 12/10/2011 0824   CL 103 11/08/2011 1106   CO2 28 12/10/2011 0824   CO2 26 11/08/2011 1106   BUN 10 12/10/2011 0824   BUN 11 11/08/2011 1106   CREATININE 1.1 12/10/2011 0824   CREATININE 0.93 11/08/2011 1106      Component Value Date/Time   CALCIUM 8.6 12/10/2011 0824   CALCIUM 9.9 11/08/2011 1106   ALKPHOS 64 12/10/2011 0824   ALKPHOS 76 11/08/2011 1106   AST 20 12/10/2011 0824   AST 16 11/08/2011 1106   ALT 14 11/08/2011 1106   BILITOT 1.20 12/10/2011 0824   BILITOT 1.1 11/08/2011 1106       RADIOGRAPHIC STUDIES: Ct Head W Wo Contrast  12/10/2011  *RADIOLOGY REPORT*  Clinical Data:  Metastatic lung cancer.  Ongoing chemotherapy. Radiation therapy complete.  Expressive aphasia. Recent right neck dissection and right thyroidectomy.  CT HEAD WITHOUT AND WITH CONTRAST CT NECK WITH CONTRAST  Technique:  Contiguous axial images were obtained from the base of the skull through the vertex without and with intravenous contrast.  Multidetector CT imaging of the neck was performed using the standard protocol following the bolus administration of intravenous contrast.  Contrast: OMNIPAQUE IOHEXOL 300 MG/ML  SOLN  Comparison:  08/29/2011 PET CT.  08/20/2011 and 06/13/2010 brain MR.  08/20/2011 neck CT.  05/31/2010 head CT.  CT HEAD  Findings:  Posterior left posterior temporal - occipital craniectomy.  Below the craniectomy site there is streak artifact from motion and metal.  At the resection site, small calcification and nodular enhancing area with surrounding the encephalomalacia appear similar to the prior exam (taking into account limitation by artifact).  Posterior left opercular region (series 11 and 17)  nodular enhancement which probably represents vessel rather metastatic disease.  Overall, no definitive findings of  new intracranial metastatic lesion is noted.  MR imaging would prove more sensitive for detection of intracranial metastatic disease.  Atrophy with prominent white matter type changes ( may represent result of treatment of tumor and / or small vessel disease).  No intracranial hemorrhage.   No CT evidence of large acute infarct.  IMPRESSION: Overall, no definitive findings of new intracranial metastatic lesion.  MR imaging would prove more sensitive for detection of intracranial metastatic disease.  Please see above.  CT NECK  Findings: Interval right neck dissection and right thyroidectomy. Postoperative changes are noted including loss of fat planes in the right supraclavicular region where a metastatic lymph node was resected.  The present exam will represent the patient's first post- therapy baseline exam to  which follow-up since can be compared. The largest residual lymph node presently noted is in the left supraclavicular region just lateral to the left internal jugular vein measuring 1.4 x 0.5 cm and without change.  Significant dental artifact.  Mild asymmetry of the palatine tonsil region without definitive  mass identified.  Asymmetry of the parotid glands unchanged.  Arising from the posterior upper right molars and extending into the inferior aspect of the right maxillary sinus is a bony expansion with opacification which may represent a radicular cyst. Radiopaque material within this structure has shifted position slightly otherwise stable.  Atherosclerotic type changes carotid bifurcation without hemodynamically significant stenosis.  IMPRESSION:  Interval right neck dissection and right thyroidectomy. Postoperative changes are noted including loss of fat planes in the right supraclavicular region where a metastatic lymph node was resected.  The present exam will represent the patient's first post- therapy baseline exam to  which follow-up since can be compared.  The largest residual lymph node presently noted is in the left supraclavicular region just lateral to the left internal jugular vein measuring 1.4 x 0.5 cm and without change.  Please see above.  Original Report Authenticated By: Fuller Canada, M.D.   Ct Soft Tissue Neck W Contrast  12/10/2011  *RADIOLOGY REPORT*  Clinical Data:  Metastatic lung cancer.  Ongoing chemotherapy. Radiation therapy complete.  Expressive aphasia. Recent right neck dissection and right thyroidectomy.  CT HEAD WITHOUT AND WITH CONTRAST CT NECK WITH CONTRAST  Technique:  Contiguous axial images were obtained from the base of the skull through the vertex without and with intravenous contrast. Multidetector CT imaging of the neck was performed using the standard protocol following the bolus administration of intravenous contrast.  Contrast: OMNIPAQUE IOHEXOL 300 MG/ML  SOLN  Comparison:  08/29/2011 PET CT.  08/20/2011 and 06/13/2010 brain MR.  08/20/2011 neck CT.  05/31/2010 head CT.  CT HEAD  Findings:  Posterior left posterior temporal - occipital craniectomy.  Below the craniectomy site there is streak artifact from motion and metal.  At the resection site, small  calcification and nodular enhancing area with surrounding the encephalomalacia appear similar to the prior exam (taking into account limitation by artifact).  Posterior left opercular region (series 11 and 17)  nodular enhancement which probably represents vessel rather metastatic disease.  Overall, no definitive findings of new intracranial metastatic lesion is noted.  MR imaging would prove more sensitive for detection of intracranial metastatic disease.  Atrophy with prominent white matter type changes ( may represent result of treatment of tumor and / or small vessel disease).  No intracranial hemorrhage.   No CT evidence of large acute infarct.  IMPRESSION: Overall, no definitive findings of new intracranial metastatic lesion.  MR imaging  would prove more sensitive for detection of intracranial metastatic disease.  Please see above.  CT NECK  Findings: Interval right neck dissection and right thyroidectomy. Postoperative changes are noted including loss of fat planes in the right supraclavicular region where a metastatic lymph node was resected.  The present exam will represent the patient's first post- therapy baseline exam to  which follow-up since can be compared. The largest residual lymph node presently noted is in the left supraclavicular region just lateral to the left internal jugular vein measuring 1.4 x 0.5 cm and without change.  Significant dental artifact.  Mild asymmetry of the palatine tonsil region without definitive mass identified.  Asymmetry of the parotid glands unchanged.  Arising from the posterior upper right molars and extending into the inferior aspect of the right maxillary sinus is a bony expansion with opacification which may represent a radicular cyst. Radiopaque material within this structure has shifted position slightly otherwise stable.  Atherosclerotic type changes carotid bifurcation without hemodynamically significant stenosis.  IMPRESSION:  Interval right neck dissection and  right thyroidectomy. Postoperative changes are noted including loss of fat planes in the right supraclavicular region where a metastatic lymph node was resected.  The present exam will represent the patient's first post- therapy baseline exam to  which follow-up since can be compared.  The largest residual lymph node presently noted is in the left supraclavicular region just lateral to the left internal jugular vein measuring 1.4 x 0.5 cm and without change.  Please see above.  Original Report Authenticated By: Fuller Canada, M.D.   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  Initial diagnosis of stage IIB in 2002.  Patient status post left lower lobectomy. Right supraclavicular nodal metastasis with recent surgery.  CT CHEST, ABDOMEN AND PELVIS WITH CONTRAST  Technique:  Multidetector CT imaging of the chest, abdomen and pelvis was performed following the standard protocol during bolus administration of intravenous contrast.  Contrast: OMNIPAQUE IOHEXOL 300 MG/ML  SOLN  Comparison:  PET CT scan 1/9 2013  CT CHEST  Findings:  No axillary lymphadenopathy.  Inflammation in the right supraclavicular location at site of interval surgery.  No supraclavicular adenopathy present.  No mediastinal lymphadenopathy.  Trace pericardial thickening is present. Esophagus is normal.  There is volume loss in the left hemithorax following left upper lobectomy.  There is no evidence of new nodularity.  There is a partially loculated pleural fluid on the left which is not changed. 5 mm right upper lobe pulmonary nodule (image 20) is unchanged from CT 05/17/2011.  No new pulmonary nodules.  IMPRESSION:  1.  No evidence of metastatic lymphadenopathy. 2.  Postsurgical change in the right neck and left hemithorax. 3.  Stable right upper lobe pulmonary nodule.  CT ABDOMEN AND PELVIS  Findings:  A tiny hypodensity in the left hepatic lobe which  is stable.  No new hepatic lesions.  The gallbladder, pancreas, spleen, adrenal glands are normal.  Kidneys are normal.  The stomach, small bowel, appendix, and cecum are normal.  Colon rectosigmoid colon are normal.  Abdominal aorta is normal caliber.  No retroperitoneal or periportal lymphadenopathy.  No peritoneal disease.  The uterus and bladder normal.  There is a fibroid within the uterine fundus.  No pelvic lymphadenopathy. Review of  bone windows demonstrates no aggressive osseous lesions.  IMPRESSION: No evidence of metastatic disease in the abdomen or pelvis.  Stable exam.  Original Report Authenticated By: Genevive Bi, M.D.   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  Initial diagnosis of stage IIB in 2002.  Patient status post left lower lobectomy. Right supraclavicular nodal metastasis with recent surgery.  CT CHEST, ABDOMEN AND PELVIS WITH CONTRAST  Technique:  Multidetector CT imaging of the chest, abdomen and pelvis was performed following the standard protocol during bolus administration of intravenous contrast.  Contrast: OMNIPAQUE IOHEXOL 300 MG/ML  SOLN  Comparison:  PET CT scan 1/9 2013  CT CHEST  Findings:  No axillary lymphadenopathy.  Inflammation in the right supraclavicular location at site of interval surgery.  No supraclavicular adenopathy present.  No mediastinal lymphadenopathy.  Trace pericardial thickening is present. Esophagus is normal.  There is volume loss in the left hemithorax following left upper lobectomy.  There is no evidence of new nodularity.  There is a partially loculated pleural fluid on the left which is not changed. 5 mm right upper lobe pulmonary nodule (image 20) is unchanged from CT 05/17/2011.  No new pulmonary nodules.  IMPRESSION:  1.  No evidence of metastatic lymphadenopathy. 2.  Postsurgical change in the right neck and left hemithorax. 3.  Stable  right upper lobe pulmonary nodule.  CT ABDOMEN AND PELVIS  Findings:  A tiny hypodensity in the left hepatic lobe which is stable.  No new hepatic lesions.  The gallbladder, pancreas, spleen, adrenal glands are normal.  Kidneys are normal.  The stomach, small bowel, appendix, and cecum are normal.  Colon rectosigmoid colon are normal.  Abdominal aorta is normal caliber.  No retroperitoneal or periportal lymphadenopathy.  No peritoneal disease.  The uterus and bladder normal.  There is a fibroid within the uterine fundus.  No pelvic lymphadenopathy. Review of  bone windows demonstrates no aggressive osseous lesions.  IMPRESSION: No evidence of metastatic disease in the abdomen or pelvis.  Stable exam.  Original Report Authenticated By: Genevive Bi, M.D.    ASSESSMENT: This is a very pleasant 66 years old African American female with metastatic non-small cell lung cancer currently on treatment with Tarceva 150 mg by mouth daily. The patient is tolerating her treatment fairly well and she has no significant evidence for disease recurrence. I discussed the scan results with the patient and her husband.  PLAN: I recommend for her to continue the same treatment was Tarceva 150 mg by mouth daily. I will continue to monitor the right supraclavicular lymph nodes closely on the upcoming scans. If there is any further enlargement in the future his scan I may consider referring the patient to radiation oncology for consideration of palliative radiotherapy to this area. She would come back for followup visit in 6 weeks for reevaluation. She would have a repeat CT scan of the neck, chest abdomen and pelvis in 3 months.  All questions were answered. The patient knows to call the clinic with any problems, questions or concerns. We can certainly see the patient much sooner if necessary.  I spent 20 minutes counseling the patient face to face. The total time spent in the appointment was 30 minutes.

## 2011-12-20 DIAGNOSIS — K625 Hemorrhage of anus and rectum: Secondary | ICD-10-CM | POA: Diagnosis not present

## 2011-12-20 DIAGNOSIS — Z1211 Encounter for screening for malignant neoplasm of colon: Secondary | ICD-10-CM | POA: Diagnosis not present

## 2011-12-20 DIAGNOSIS — Z8379 Family history of other diseases of the digestive system: Secondary | ICD-10-CM | POA: Diagnosis not present

## 2011-12-20 DIAGNOSIS — R198 Other specified symptoms and signs involving the digestive system and abdomen: Secondary | ICD-10-CM | POA: Diagnosis not present

## 2012-01-02 ENCOUNTER — Other Ambulatory Visit: Payer: Self-pay | Admitting: *Deleted

## 2012-01-02 DIAGNOSIS — C3430 Malignant neoplasm of lower lobe, unspecified bronchus or lung: Secondary | ICD-10-CM

## 2012-01-02 MED ORDER — ERLOTINIB HCL 150 MG PO TABS: 150.0000 mg | ORAL_TABLET | Freq: Every day | ORAL | Status: DC

## 2012-01-08 NOTE — Progress Notes (Signed)
Received fax confirmation from [employer] that pt's Tarceva has been shipped for delivery on 5/21.  dph

## 2012-01-23 ENCOUNTER — Other Ambulatory Visit (HOSPITAL_BASED_OUTPATIENT_CLINIC_OR_DEPARTMENT_OTHER): Payer: Medicare Other | Admitting: Lab

## 2012-01-23 ENCOUNTER — Telehealth: Payer: Self-pay | Admitting: Internal Medicine

## 2012-01-23 ENCOUNTER — Ambulatory Visit (HOSPITAL_BASED_OUTPATIENT_CLINIC_OR_DEPARTMENT_OTHER): Payer: Medicare Other | Admitting: Physician Assistant

## 2012-01-23 ENCOUNTER — Encounter: Payer: Self-pay | Admitting: Physician Assistant

## 2012-01-23 VITALS — BP 133/79 | HR 73 | Temp 97.1°F | Ht 69.0 in | Wt 208.0 lb

## 2012-01-23 DIAGNOSIS — C3430 Malignant neoplasm of lower lobe, unspecified bronchus or lung: Secondary | ICD-10-CM

## 2012-01-23 DIAGNOSIS — C3490 Malignant neoplasm of unspecified part of unspecified bronchus or lung: Secondary | ICD-10-CM

## 2012-01-23 DIAGNOSIS — C7931 Secondary malignant neoplasm of brain: Secondary | ICD-10-CM | POA: Diagnosis not present

## 2012-01-23 DIAGNOSIS — C7949 Secondary malignant neoplasm of other parts of nervous system: Secondary | ICD-10-CM | POA: Diagnosis not present

## 2012-01-23 LAB — COMPREHENSIVE METABOLIC PANEL
ALT: 17 U/L (ref 0–35)
AST: 17 U/L (ref 0–37)
Albumin: 4.4 g/dL (ref 3.5–5.2)
Alkaline Phosphatase: 72 U/L (ref 39–117)
BUN: 11 mg/dL (ref 6–23)
CO2: 26 mEq/L (ref 19–32)
Calcium: 9.5 mg/dL (ref 8.4–10.5)
Chloride: 105 mEq/L (ref 96–112)
Creatinine, Ser: 0.83 mg/dL (ref 0.50–1.10)
Glucose, Bld: 81 mg/dL (ref 70–99)
Potassium: 3.6 mEq/L (ref 3.5–5.3)
Sodium: 140 mEq/L (ref 135–145)
Total Bilirubin: 1.3 mg/dL — ABNORMAL HIGH (ref 0.3–1.2)
Total Protein: 6.4 g/dL (ref 6.0–8.3)

## 2012-01-23 LAB — CBC WITH DIFFERENTIAL/PLATELET
BASO%: 0.6 % (ref 0.0–2.0)
Basophils Absolute: 0 10*3/uL (ref 0.0–0.1)
EOS%: 4 % (ref 0.0–7.0)
Eosinophils Absolute: 0.1 10*3/uL (ref 0.0–0.5)
HCT: 40.7 % (ref 34.8–46.6)
HGB: 13.7 g/dL (ref 11.6–15.9)
LYMPH%: 19.6 % (ref 14.0–49.7)
MCH: 30.7 pg (ref 25.1–34.0)
MCHC: 33.6 g/dL (ref 31.5–36.0)
MCV: 91.4 fL (ref 79.5–101.0)
MONO#: 0.4 10*3/uL (ref 0.1–0.9)
MONO%: 9.8 % (ref 0.0–14.0)
NEUT#: 2.4 10*3/uL (ref 1.5–6.5)
NEUT%: 66 % (ref 38.4–76.8)
Platelets: 221 10*3/uL (ref 145–400)
RBC: 4.45 10*6/uL (ref 3.70–5.45)
RDW: 14 % (ref 11.2–14.5)
WBC: 3.7 10*3/uL — ABNORMAL LOW (ref 3.9–10.3)
lymph#: 0.7 10*3/uL — ABNORMAL LOW (ref 0.9–3.3)
nRBC: 0 % (ref 0–0)

## 2012-01-23 NOTE — Telephone Encounter (Signed)
appts made and printed for pt,pt to drink water based contrast and to arrive at 9:00

## 2012-01-25 NOTE — Progress Notes (Signed)
[facility] [phone]   [phone]  OFFICE PROGRESS NOTE  Valerie Aliment, MD, MD 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   PRIOR THERAPY:  1. Status post left lower lobectomy under the care of Dr Edwyna Shell in October 2002. 2. Status post course of concurrent chemoradiation with weekly carboplatin and paclitaxel under the care of Dr Truett Perna and Dr Dorna Bloom in early 2003. 3. Status post treatment with Celebrex and Iressa according to the clinical trial at [facility] for a total of 1 year. 4. Status post left occipital craniotomy for tumor resection on September 21, 2005, under the care of Dr Franky Macho. 5. Status post whole brain irradiation under the care of Dr Dorna Bloom, completed November 07, 2005. 6. Status post gamma knife treatment to the resected cavitary recurrence on June 02, 2008, at [facility]. 7. Status post right thyroidectomy with radical neck dissection under the care of Dr. Pollyann Kennedy on 10/18/2011.  CURRENT THERAPY: Tarceva 150 mg by mouth daily with therapy beginning 06/03/2007.   INTERVAL HISTORY: Valerie [name] 66 y.o. female returns to the clinic today for followup visit. Overall she's doing well. 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 Regarding the Tarceva she continues to have a dry itchy skin rash related to the Tarceva as well as some diarrhea that she manages well with Imodium. She reports that she is scheduled to have a colonoscopy to be done by Dr. Janora Norlander secondary to some recent blood in her stool. She voices no specific complaints today.  The patient denied having any significant weight loss or night sweats. She has no chest pain or shortness of breath, no cough or hemoptysis. She is tolerating her treatment with Tarceva fairly well.   MEDICAL  HISTORY: Past Medical History  Diagnosis Date  . Heart murmur   . Shortness of breath     hx lung ca   . Thyroid adenoma     ?  [name] Arthritis   . Lung cancer dx'd 2002  . Metastasis to brain dx'd 2008  . Metastasis to lymph nodes dx'd 09/2011    ALLERGIES:   has no known allergies.  MEDICATIONS:  Current Outpatient Prescriptions  Medication Sig Dispense Refill  . alendronate (FOSAMAX) 70 MG tablet Take 70 mg by mouth every 7 (seven) days. Saturdays; Take with a full glass of water on an empty stomach.      . Ascorbic Acid (VITAMIN C PO) Take 1 tablet by mouth daily.      [name] aspirin EC 81 MG tablet Take 81 mg by mouth every other day.       . Cholecalciferol (VITAMIN D3) 2000 UNITS TABS Take 2,000 Int'l Units by mouth daily.       [name] donepezil (ARICEPT) 10 MG tablet Take 10 mg by mouth at bedtime.       . erlotinib (TARCEVA) 150 MG tablet Take 1 tablet (150 mg total) by mouth at bedtime. Take on an empty stomach 1 hour before meals or 2 hours after.  30 tablet  1  . Multiple Vitamin (MULITIVITAMIN WITH MINERALS) TABS Take 1 tablet by mouth daily.      . naphazoline-pheniramine (NAPHCON-A) 0.025-0.3 % ophthalmic solution Place 1 drop into both eyes 4 (four) times daily as needed. For dry eyes  SURGICAL HISTORY:  Past Surgical History  Procedure Date  . Brain surgery     tumor  . Lung lobectomy     left ca  . Radical neck dissection 10/18/2011    Procedure: RADICAL NECK DISSECTION;  Surgeon: Serena Colonel, MD;  Location: [facility] OR;  Service: ENT;  Laterality: Right;  RIGHT MODIFIED NECK DISSECTION /POSSIBLE RIGHT THYROIDECTOM  . Thyroidectomy 10/18/2011    Procedure: THYROIDECTOMY;  Surgeon: Serena Colonel, MD;  Location: [facility] OR;  Service: ENT;  Laterality: Right;    REVIEW OF SYSTEMS:  A comprehensive review of systems was negative except for: Gastrointestinal: positive for diarrhea   PHYSICAL EXAMINATION: General appearance: alert, cooperative and no distress Head: Normocephalic,  without obvious abnormality, atraumatic Neck: no adenopathy Lymph nodes: Cervical, supraclavicular, and axillary nodes normal. Resp: clear to auscultation bilaterally Cardio: regular rate and rhythm, S1, S2 normal, no murmur, click, rub or gallop GI: soft, non-tender; bowel sounds normal; no masses,  no organomegaly Extremities: extremities normal, atraumatic, no cyanosis or edema Neurologic: Alert and oriented X 3, normal strength and tone. Normal symmetric reflexes. Normal coordination and gait  ECOG PERFORMANCE STATUS: 1 - Symptomatic but completely ambulatory  Blood pressure 133/79, pulse 73, temperature 97.1 F (36.2 C), temperature source Oral, height 5\' 9"  (1.753 m), weight 208 lb (94.348 kg).  LABORATORY DATA: Lab Results  Component Value Date   WBC 3.7* 01/23/2012   HGB 13.7 01/23/2012   HCT 40.7 01/23/2012   MCV 91.4 01/23/2012   PLT 221 01/23/2012      Chemistry      Component Value Date/Time   NA 140 01/23/2012 0941   NA 140 12/10/2011 0824   K 3.6 01/23/2012 0941   K 3.7 12/10/2011 0824   CL 105 01/23/2012 0941   CL 100 12/10/2011 0824   CO2 26 01/23/2012 0941   CO2 28 12/10/2011 0824   BUN 11 01/23/2012 0941   BUN 10 12/10/2011 0824   CREATININE 0.83 01/23/2012 0941   CREATININE 1.1 12/10/2011 0824      Component Value Date/Time   CALCIUM 9.5 01/23/2012 0941   CALCIUM 8.6 12/10/2011 0824   ALKPHOS 72 01/23/2012 0941   ALKPHOS 64 12/10/2011 0824   AST 17 01/23/2012 0941   AST 20 12/10/2011 0824   ALT 17 01/23/2012 0941   BILITOT 1.3* 01/23/2012 0941   BILITOT 1.20 12/10/2011 0824       RADIOGRAPHIC STUDIES: Ct Head W Wo Contrast  12/10/2011  *RADIOLOGY REPORT*  Clinical Data:  Metastatic lung cancer.  Ongoing chemotherapy. Radiation therapy complete.  Expressive aphasia. Recent right neck dissection and right thyroidectomy.  CT HEAD WITHOUT AND WITH CONTRAST CT NECK WITH CONTRAST  Technique:  Contiguous axial images were obtained from the base of the skull through the vertex without and  with intravenous contrast. Multidetector CT imaging of the neck was performed using the standard protocol following the bolus administration of intravenous contrast.  Contrast: OMNIPAQUE IOHEXOL 300 MG/ML  SOLN  Comparison:  08/29/2011 PET CT.  08/20/2011 and 06/13/2010 brain MR.  08/20/2011 neck CT.  05/31/2010 head CT.  CT HEAD  Findings:  Posterior left posterior temporal - occipital craniectomy.  Below the craniectomy site there is streak artifact from motion and metal.  At the resection site, small calcification and nodular enhancing area with surrounding the encephalomalacia appear similar to the prior exam (taking into account limitation by artifact).  Posterior left opercular region (series 11 and 17)  nodular enhancement which probably represents vessel  rather metastatic disease.  Overall, no definitive findings of new intracranial metastatic lesion is noted.  MR imaging would prove more sensitive for detection of intracranial metastatic disease.  Atrophy with prominent white matter type changes ( may represent result of treatment of tumor and / or small vessel disease).  No intracranial hemorrhage.   No CT evidence of large acute infarct.  IMPRESSION: Overall, no definitive findings of new intracranial metastatic lesion.  MR imaging would prove more sensitive for detection of intracranial metastatic disease.  Please see above.  CT NECK  Findings: Interval right neck dissection and right thyroidectomy. Postoperative changes are noted including loss of fat planes in the right supraclavicular region where a metastatic lymph node was resected.  The present exam will represent the patient's first post- therapy baseline exam to  which follow-up since can be compared. The largest residual lymph node presently noted is in the left supraclavicular region just lateral to the left internal jugular vein measuring 1.4 x 0.5 cm and without change.  Significant dental artifact.  Mild asymmetry of the palatine  tonsil region without definitive mass identified.  Asymmetry of the parotid glands unchanged.  Arising from the posterior upper right molars and extending into the inferior aspect of the right maxillary sinus is a bony expansion with opacification which may represent a radicular cyst. Radiopaque material within this structure has shifted position slightly otherwise stable.  Atherosclerotic type changes carotid bifurcation without hemodynamically significant stenosis.  IMPRESSION:  Interval right neck dissection and right thyroidectomy. Postoperative changes are noted including loss of fat planes in the right supraclavicular region where a metastatic lymph node was resected.  The present exam will represent the patient's first post- therapy baseline exam to  which follow-up since can be compared.  The largest residual lymph node presently noted is in the left supraclavicular region just lateral to the left internal jugular vein measuring 1.4 x 0.5 cm and without change.  Please see above.  Original Report Authenticated By: Fuller Canada, M.D.   Ct Soft Tissue Neck W Contrast  12/10/2011  *RADIOLOGY REPORT*  Clinical Data:  Metastatic lung cancer.  Ongoing chemotherapy. Radiation therapy complete.  Expressive aphasia. Recent right neck dissection and right thyroidectomy.  CT HEAD WITHOUT AND WITH CONTRAST CT NECK WITH CONTRAST  Technique:  Contiguous axial images were obtained from the base of the skull through the vertex without and with intravenous contrast. Multidetector CT imaging of the neck was performed using the standard protocol following the bolus administration of intravenous contrast.  Contrast: OMNIPAQUE IOHEXOL 300 MG/ML  SOLN  Comparison:  08/29/2011 PET CT.  08/20/2011 and 06/13/2010 brain MR.  08/20/2011 neck CT.  05/31/2010 head CT.  CT HEAD  Findings:  Posterior left posterior temporal - occipital craniectomy.  Below the craniectomy site there is streak artifact from motion and metal.  At  the resection site, small calcification and nodular enhancing area with surrounding the encephalomalacia appear similar to the prior exam (taking into account limitation by artifact).  Posterior left opercular region (series 11 and 17)  nodular enhancement which probably represents vessel rather metastatic disease.  Overall, no definitive findings of new intracranial metastatic lesion is noted.  MR imaging would prove more sensitive for detection of intracranial metastatic disease.  Atrophy with prominent white matter type changes ( may represent result of treatment of tumor and / or small vessel disease).  No intracranial hemorrhage.   No CT evidence of large acute infarct.  IMPRESSION: Overall, no definitive  findings of new intracranial metastatic lesion.  MR imaging would prove more sensitive for detection of intracranial metastatic disease.  Please see above.  CT NECK  Findings: Interval right neck dissection and right thyroidectomy. Postoperative changes are noted including loss of fat planes in the right supraclavicular region where a metastatic lymph node was resected.  The present exam will represent the patient's first post- therapy baseline exam to  which follow-up since can be compared. The largest residual lymph node presently noted is in the left supraclavicular region just lateral to the left internal jugular vein measuring 1.4 x 0.5 cm and without change.  Significant dental artifact.  Mild asymmetry of the palatine tonsil region without definitive mass identified.  Asymmetry of the parotid glands unchanged.  Arising from the posterior upper right molars and extending into the inferior aspect of the right maxillary sinus is a bony expansion with opacification which may represent a radicular cyst. Radiopaque material within this structure has shifted position slightly otherwise stable.  Atherosclerotic type changes carotid bifurcation without hemodynamically significant stenosis.  IMPRESSION:  Interval  right neck dissection and right thyroidectomy. Postoperative changes are noted including loss of fat planes in the right supraclavicular region where a metastatic lymph node was resected.  The present exam will represent the patient's first post- therapy baseline exam to  which follow-up since can be compared.  The largest residual lymph node presently noted is in the left supraclavicular region just lateral to the left internal jugular vein measuring 1.4 x 0.5 cm and without change.  Please see above.  Original Report Authenticated By: Fuller Canada, M.D.   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  Initial diagnosis of stage IIB in 2002.  Patient status post left lower lobectomy. Right supraclavicular nodal metastasis with recent surgery.  CT CHEST, ABDOMEN AND PELVIS WITH CONTRAST  Technique:  Multidetector CT imaging of the chest, abdomen and pelvis was performed following the standard protocol during bolus administration of intravenous contrast.  Contrast: OMNIPAQUE IOHEXOL 300 MG/ML  SOLN  Comparison:  PET CT scan 1/9 2013  CT CHEST  Findings:  No axillary lymphadenopathy.  Inflammation in the right supraclavicular location at site of interval surgery.  No supraclavicular adenopathy present.  No mediastinal lymphadenopathy.  Trace pericardial thickening is present. Esophagus is normal.  There is volume loss in the left hemithorax following left upper lobectomy.  There is no evidence of new nodularity.  There is a partially loculated pleural fluid on the left which is not changed. 5 mm right upper lobe pulmonary nodule (image 20) is unchanged from CT 05/17/2011.  No new pulmonary nodules.  IMPRESSION:  1.  No evidence of metastatic lymphadenopathy. 2.  Postsurgical change in the right neck and left hemithorax. 3.  Stable right upper lobe pulmonary nodule.  CT ABDOMEN AND PELVIS  Findings:  A tiny hypodensity in  the left hepatic lobe which is stable.  No new hepatic lesions.  The gallbladder, pancreas, spleen, adrenal glands are normal.  Kidneys are normal.  The stomach, small bowel, appendix, and cecum are normal.  Colon rectosigmoid colon are normal.  Abdominal aorta is normal caliber.  No retroperitoneal or periportal lymphadenopathy.  No peritoneal disease.  The uterus and bladder normal.  There is a fibroid within the uterine fundus.  No pelvic lymphadenopathy. Review of  bone windows demonstrates no aggressive osseous lesions.  IMPRESSION: No evidence of metastatic disease  in the abdomen or pelvis.  Stable exam.  Original Report Authenticated By: Genevive Bi, M.D.   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  Initial diagnosis of stage IIB in 2002.  Patient status post left lower lobectomy. Right supraclavicular nodal metastasis with recent surgery.  CT CHEST, ABDOMEN AND PELVIS WITH CONTRAST  Technique:  Multidetector CT imaging of the chest, abdomen and pelvis was performed following the standard protocol during bolus administration of intravenous contrast.  Contrast: OMNIPAQUE IOHEXOL 300 MG/ML  SOLN  Comparison:  PET CT scan 1/9 2013  CT CHEST  Findings:  No axillary lymphadenopathy.  Inflammation in the right supraclavicular location at site of interval surgery.  No supraclavicular adenopathy present.  No mediastinal lymphadenopathy.  Trace pericardial thickening is present. Esophagus is normal.  There is volume loss in the left hemithorax following left upper lobectomy.  There is no evidence of new nodularity.  There is a partially loculated pleural fluid on the left which is not changed. 5 mm right upper lobe pulmonary nodule (image 20) is unchanged from CT 05/17/2011.  No new pulmonary nodules.  IMPRESSION:  1.  No evidence of metastatic lymphadenopathy. 2.  Postsurgical change in the right neck and  left hemithorax. 3.  Stable right upper lobe pulmonary nodule.  CT ABDOMEN AND PELVIS  Findings:  A tiny hypodensity in the left hepatic lobe which is stable.  No new hepatic lesions.  The gallbladder, pancreas, spleen, adrenal glands are normal.  Kidneys are normal.  The stomach, small bowel, appendix, and cecum are normal.  Colon rectosigmoid colon are normal.  Abdominal aorta is normal caliber.  No retroperitoneal or periportal lymphadenopathy.  No peritoneal disease.  The uterus and bladder normal.  There is a fibroid within the uterine fundus.  No pelvic lymphadenopathy. Review of  bone windows demonstrates no aggressive osseous lesions.  IMPRESSION: No evidence of metastatic disease in the abdomen or pelvis.  Stable exam.  Original Report Authenticated By: Genevive Bi, M.D.    ASSESSMENT/PLAN: This is a very pleasant 66 years old African American female with metastatic non-small cell lung cancer currently on treatment with Tarceva 150 mg by mouth daily. The patient is tolerating her treatment fairly well and she has no significant evidence for disease recurrence on her last restaging CT scan. Patient was discussed with Dr. Arbutus Ped. She'll continue on Tarceva at 150 mg by mouth daily. She'll followup with Dr. Arbutus Ped in 6 weeks with repeat CT of the neck chest abdomen and pelvis with contrast to reevaluate her disease.  Laural Benes, Mizael Sagar E, PA-C   All questions were answered. The patient knows to call the clinic with any problems, questions or concerns. We can certainly see the patient much sooner if necessary.  I spent 20 minutes counseling the patient face to face. The total time spent in the appointment was 30 minutes.

## 2012-02-01 DIAGNOSIS — Z86010 Personal history of colonic polyps: Secondary | ICD-10-CM | POA: Diagnosis not present

## 2012-02-01 DIAGNOSIS — Z1211 Encounter for screening for malignant neoplasm of colon: Secondary | ICD-10-CM | POA: Diagnosis not present

## 2012-02-01 DIAGNOSIS — K625 Hemorrhage of anus and rectum: Secondary | ICD-10-CM | POA: Diagnosis not present

## 2012-02-01 DIAGNOSIS — R198 Other specified symptoms and signs involving the digestive system and abdomen: Secondary | ICD-10-CM | POA: Diagnosis not present

## 2012-02-02 IMAGING — CT CT CHEST W/ CM
2 of 4 series · 17 of 46 positions shown, 19 images · IV contrast (omniscan)
Comparison: 11/30/2009

CT CHEST

CLINICAL DATA: Lung cancer status post left lower lobectomy, brain
metastasis diagnosed 4994.  Completed radiation.  Currently on
Tarceva.

CT CHEST, ABDOMEN AND PELVIS WITH CONTRAST
TECHNIQUE: Multidetector CT imaging of the chest, abdomen and
pelvis was performed following the standard protocol during bolus
administration of intravenous contrast.
Contrast: 100 ml Omniscan 300 IV contrast

[Series 2: cap with st · axial · 0.73mm/px · z∈[-404,+201]mm · 14 of 133 slices shown, 16 images]
[im 6/133  soft-tissue]
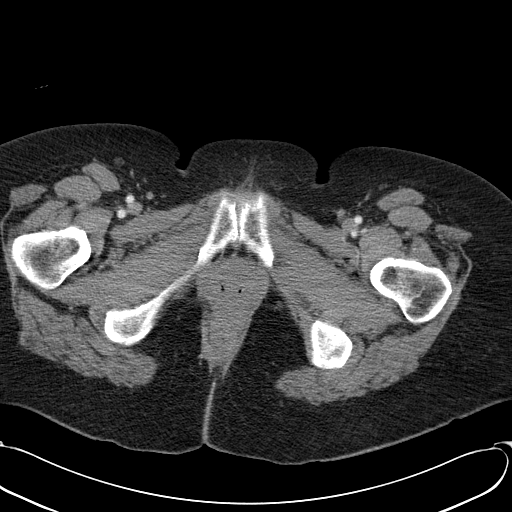
[im 6/133  bone]
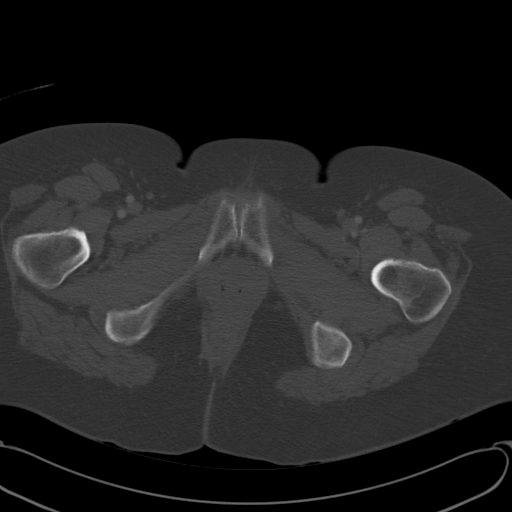
[im 16/133  soft-tissue]
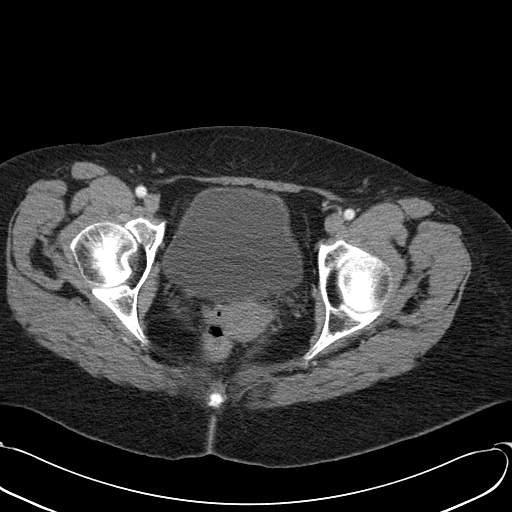
[im 27/133  soft-tissue]
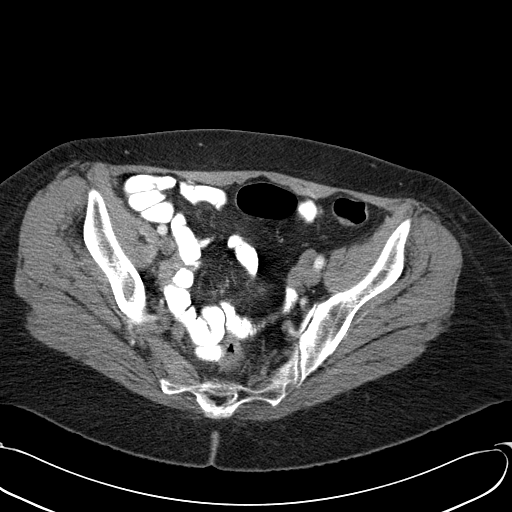
[im 37/133  soft-tissue]
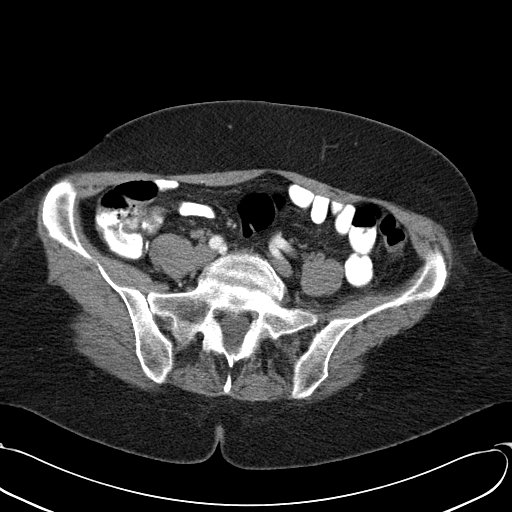
[im 43/133  soft-tissue]
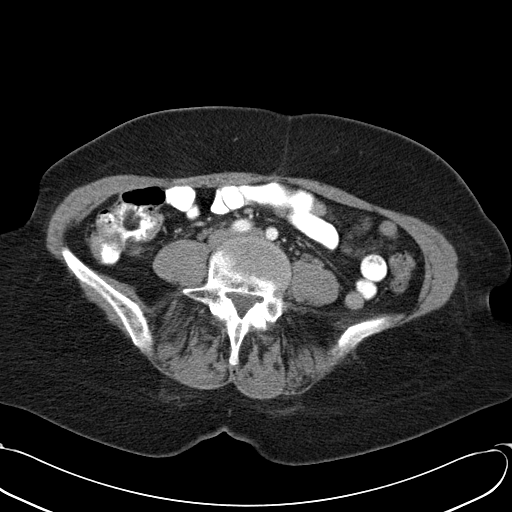
[im 53/133  soft-tissue]
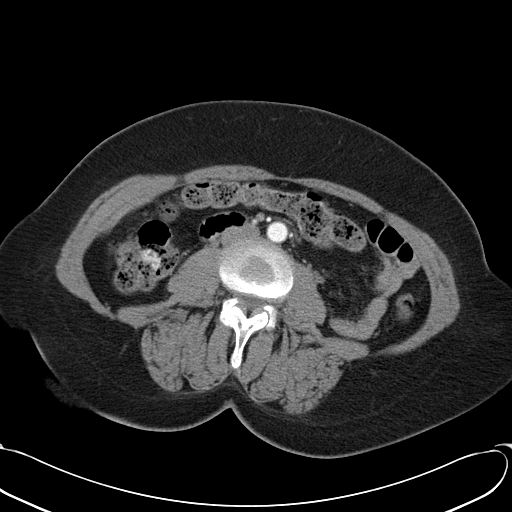
[im 64/133  soft-tissue]
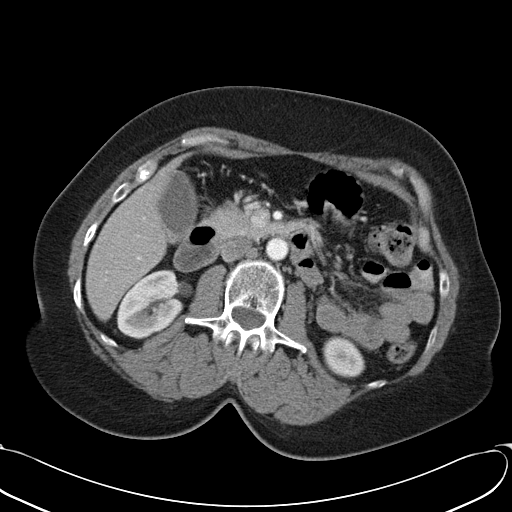
[im 69/133  soft-tissue]
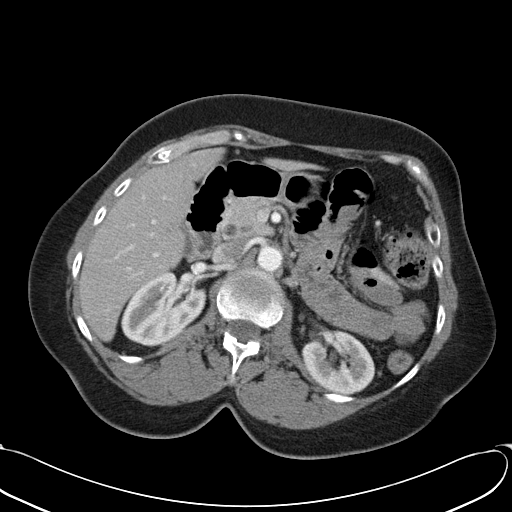
[im 80/133  soft-tissue]
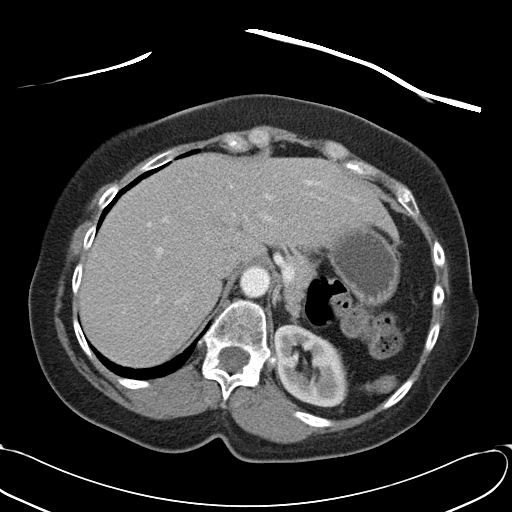
[im 80/133  bone]
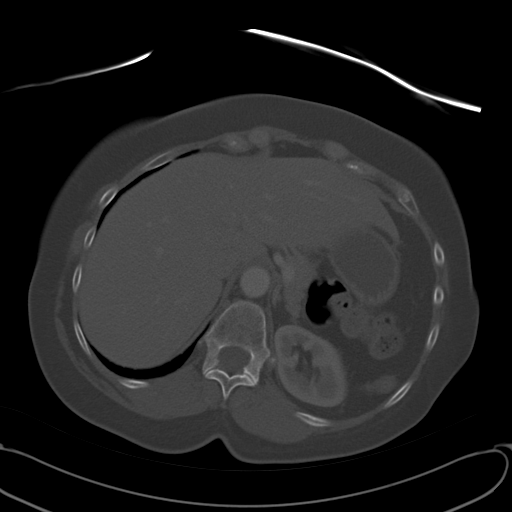
[im 90/133  soft-tissue]
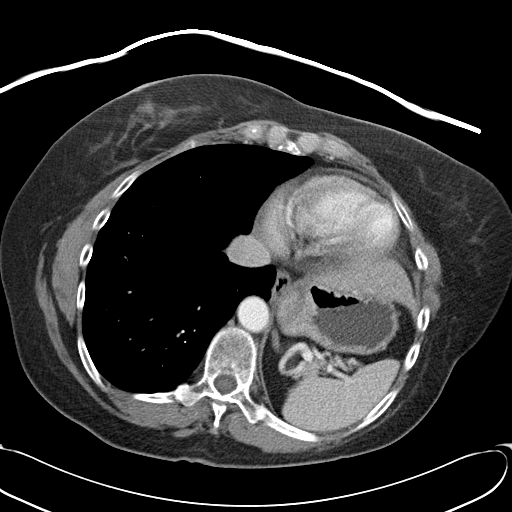
[im 101/133  soft-tissue]
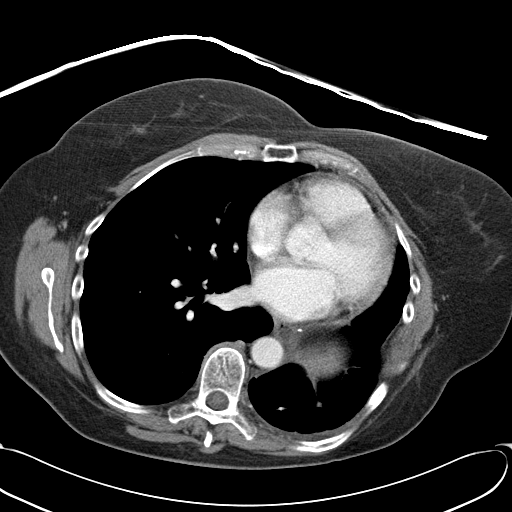
[im 106/133  soft-tissue]
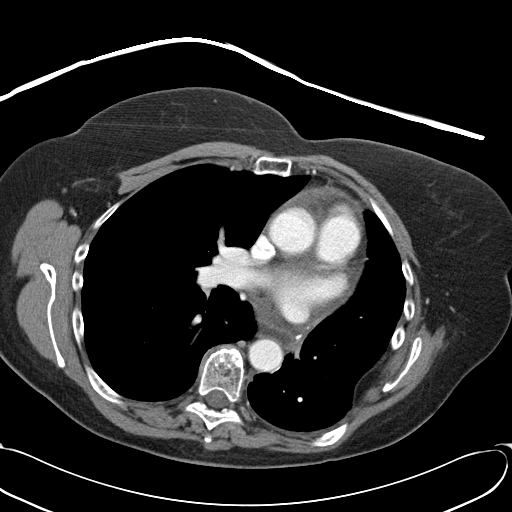
[im 117/133  soft-tissue]
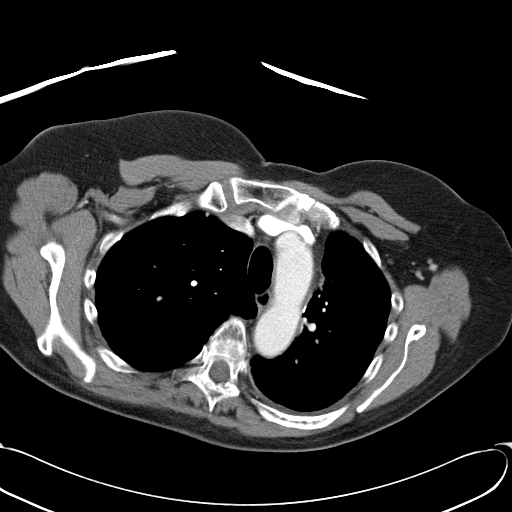
[im 127/133  soft-tissue]
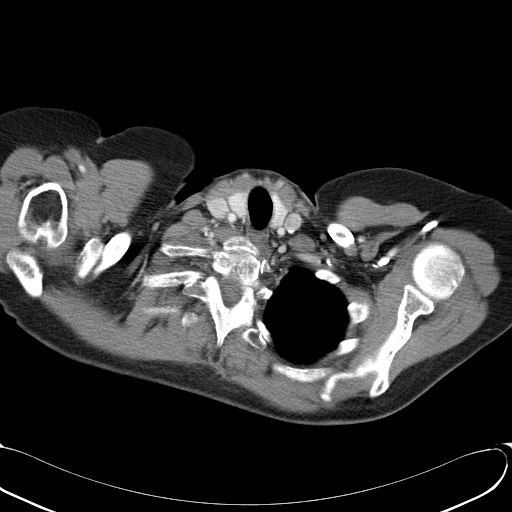

[Series 602: <mpr thick range> · coronal · 1.30mm/px · 3 of 102 slices shown]
[im 34/102  soft-tissue]
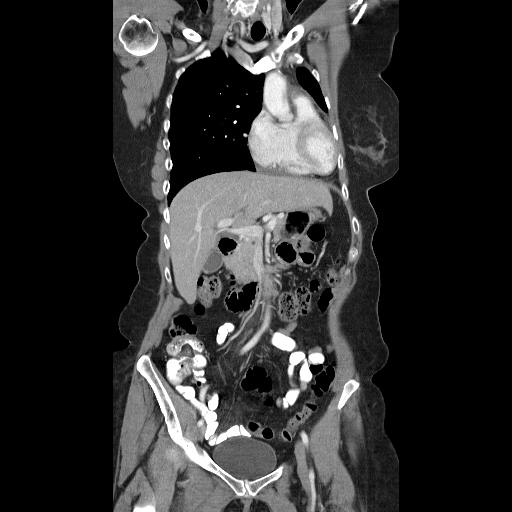
[im 45/102  soft-tissue]
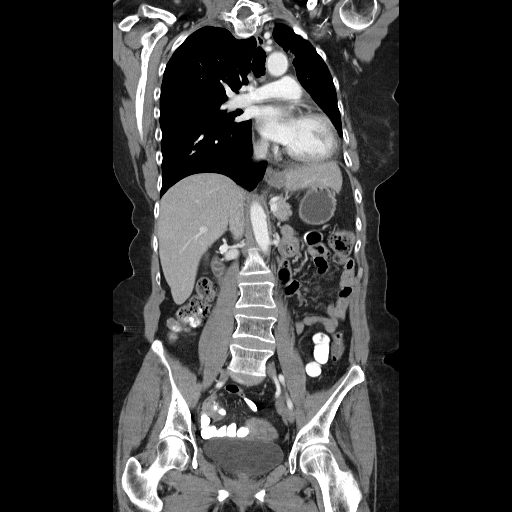
[im 57/102  soft-tissue]
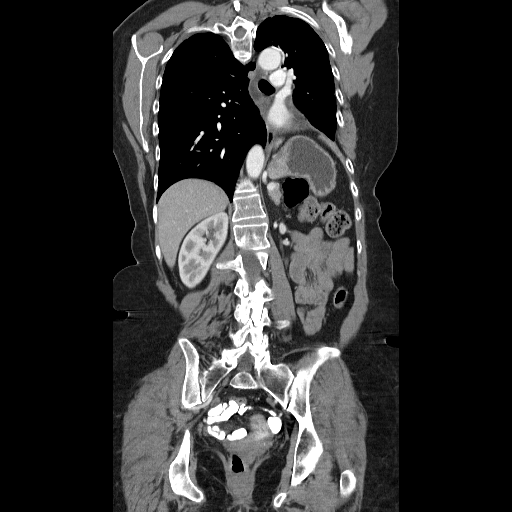

[17 of 46 positions shown; findings below may reference images not displayed]

FINDINGS: The coarse calcification in the right thyroid lobe is
stable.  Evidence of prior left lower lobectomy noted with leftward
deviation of the heart and mediastinum.  Trace fluid in the
superior pericardial recess noted.  Air fluid level of the
nondilated esophagus is present.  Evidence of left thoracotomy.  No
lymphadenopathy.  3 mm right upper lobe nodule on image 23 is
stable.  Left pleural thickening and left upper lobe linear
expected post radiation change stable. Right lower lobe pleural
parenchymal nodule on the prior exam is slightly smaller when
accounting for differences in technique.  (4 mm versus 7 mm
previously).
IMPRESSION: Stable chest with evidence of left lower lobectomy but no CT
evidence for recurrent/metastatic disease.

CT ABDOMEN AND PELVIS
FINDINGS: Left adrenal gland remains diffusely fall but without
focal mass.  Liver, right adrenal gland, kidneys, spleen,
gallbladder, and pancreas are unremarkable.  No lymphadenopathy or
free fluid.

The appendix, colon, and small bowel are unremarkable.  Uterus and
ovaries are normal.  No new lytic or sclerotic bony lesion to
suggest metastasis.
IMPRESSION: No acute intra-abdominal or pelvic pathology or evidence for
disease metastasis.

## 2012-02-08 ENCOUNTER — Encounter: Payer: Self-pay | Admitting: *Deleted

## 2012-02-08 NOTE — Progress Notes (Signed)
RECEIVED A FAX FROM [facility] CONCERNING A CONFIRMATION OF PRESCRIPTION SHIPMENT FOR TARCEVA. 

## 2012-02-28 ENCOUNTER — Ambulatory Visit (HOSPITAL_COMMUNITY)
Admission: RE | Admit: 2012-02-28 | Discharge: 2012-02-28 | Disposition: A | Discharge status: Home / Self Care | Payer: Medicare Other | Source: Ambulatory Visit | Attending: Physician Assistant | Admitting: Physician Assistant

## 2012-02-28 ENCOUNTER — Ambulatory Visit: Payer: Medicare Other | Admitting: Lab

## 2012-02-28 DIAGNOSIS — R141 Gas pain: Secondary | ICD-10-CM | POA: Diagnosis not present

## 2012-02-28 DIAGNOSIS — C3490 Malignant neoplasm of unspecified part of unspecified bronchus or lung: Secondary | ICD-10-CM | POA: Insufficient documentation

## 2012-02-28 DIAGNOSIS — E041 Nontoxic single thyroid nodule: Secondary | ICD-10-CM | POA: Insufficient documentation

## 2012-02-28 DIAGNOSIS — R918 Other nonspecific abnormal finding of lung field: Secondary | ICD-10-CM | POA: Diagnosis not present

## 2012-02-28 DIAGNOSIS — R599 Enlarged lymph nodes, unspecified: Secondary | ICD-10-CM | POA: Diagnosis not present

## 2012-02-28 DIAGNOSIS — J90 Pleural effusion, not elsewhere classified: Secondary | ICD-10-CM | POA: Diagnosis not present

## 2012-02-28 LAB — CBC WITH DIFFERENTIAL/PLATELET
BASO%: 0.5 % (ref 0.0–2.0)
Basophils Absolute: 0 10*3/uL (ref 0.0–0.1)
EOS%: 3.8 % (ref 0.0–7.0)
Eosinophils Absolute: 0.2 10*3/uL (ref 0.0–0.5)
HCT: 40.6 % (ref 34.8–46.6)
HGB: 13.5 g/dL (ref 11.6–15.9)
LYMPH%: 19.4 % (ref 14.0–49.7)
MCH: 30.4 pg (ref 25.1–34.0)
MCHC: 33.1 g/dL (ref 31.5–36.0)
MCV: 91.8 fL (ref 79.5–101.0)
MONO#: 0.4 10*3/uL (ref 0.1–0.9)
MONO%: 9.3 % (ref 0.0–14.0)
NEUT#: 2.6 10*3/uL (ref 1.5–6.5)
NEUT%: 67 % (ref 38.4–76.8)
Platelets: 224 10*3/uL (ref 145–400)
RBC: 4.42 10*6/uL (ref 3.70–5.45)
RDW: 13.7 % (ref 11.2–14.5)
WBC: 3.9 10*3/uL (ref 3.9–10.3)
lymph#: 0.8 10*3/uL — ABNORMAL LOW (ref 0.9–3.3)

## 2012-02-28 LAB — CMP (CANCER CENTER ONLY)
ALT(SGPT): 21 U/L (ref 10–47)
AST: 20 U/L (ref 11–38)
Albumin: 3.7 g/dL (ref 3.3–5.5)
Alkaline Phosphatase: 80 U/L (ref 26–84)
BUN, Bld: 12 mg/dL (ref 7–22)
CO2: 28 mEq/L (ref 18–33)
Calcium: 9.1 mg/dL (ref 8.0–10.3)
Chloride: 99 mEq/L (ref 98–108)
Creat: 1 mg/dl (ref 0.6–1.2)
Glucose, Bld: 104 mg/dL (ref 73–118)
Potassium: 4.6 mEq/L (ref 3.3–4.7)
Sodium: 138 mEq/L (ref 128–145)
Total Bilirubin: 1.3 mg/dl (ref 0.20–1.60)
Total Protein: 6.9 g/dL (ref 6.4–8.1)

## 2012-02-28 MED ORDER — IOHEXOL 300 MG/ML  SOLN
100.0000 mL | Freq: Once | INTRAMUSCULAR | Status: AC | PRN
  Administered 2012-02-28: 100 mL via INTRAVENOUS

## 2012-02-29 ENCOUNTER — Inpatient Hospital Stay (HOSPITAL_COMMUNITY)
Admission: RE | Admit: 2012-02-29 | Discharge: 2012-02-29 | Payer: Medicare Other | Source: Ambulatory Visit | Attending: Physician Assistant | Admitting: Physician Assistant

## 2012-02-29 ENCOUNTER — Other Ambulatory Visit (HOSPITAL_BASED_OUTPATIENT_CLINIC_OR_DEPARTMENT_OTHER): Payer: Medicare Other

## 2012-03-05 ENCOUNTER — Telehealth: Payer: Self-pay | Admitting: Internal Medicine

## 2012-03-05 ENCOUNTER — Ambulatory Visit (HOSPITAL_BASED_OUTPATIENT_CLINIC_OR_DEPARTMENT_OTHER): Payer: Medicare Other | Admitting: Internal Medicine

## 2012-03-05 VITALS — BP 123/80 | HR 86 | Temp 97.2°F | Ht 69.0 in | Wt 205.7 lb

## 2012-03-05 DIAGNOSIS — C3490 Malignant neoplasm of unspecified part of unspecified bronchus or lung: Secondary | ICD-10-CM | POA: Diagnosis not present

## 2012-03-05 NOTE — Progress Notes (Signed)
[facility] Telephone:([phone]) [phone]   [phone]  OFFICE PROGRESS NOTE  [name], MD 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   PRIOR THERAPY:  1. Status post left lower lobectomy under the care of Dr Edwyna Shell in October 2002. 2. Status post course of concurrent chemoradiation with weekly carboplatin and paclitaxel under the care of Dr Truett Perna and Dr Dorna Bloom in early 2003. 3. Status post treatment with Celebrex and Iressa according to the clinical trial at [facility] for a total of 1 year. 4. Status post left occipital craniotomy for tumor resection on September 21, 2005, under the care of Dr Franky Macho. 5. Status post whole brain irradiation under the care of Dr Dorna Bloom, completed November 07, 2005. 6. Status post gamma knife treatment to the resected cavitary recurrence on June 02, 2008, at [facility]. 7. Status post right thyroidectomy with radical neck dissection under the care of Dr. Pollyann Kennedy on 10/18/2011.  CURRENT THERAPY: Tarceva 150 mg by mouth daily with therapy beginning 06/03/2007.   INTERVAL HISTORY: Valerie [name] 66 y.o. female returns to the clinic today for followup visit accompanied by her husband. The patient is doing fine with no specific complaints today. She denied having any significant weight loss or night sweats. She denied having any significant chest pain, shortness breath, cough or hemoptysis.[name] she is tolerating her treatment with oral Tarceva fairly well with no significant adverse effects except for few episodes of diarrhea every now and then and she takes Imodium on as-needed basis. She has repeat CT scan of the neck, chest, abdomen and pelvis performed recently and she is here today for evaluation and discussion of her scan results.  MEDICAL HISTORY: Past Medical History    Diagnosis Date  . Heart murmur   . Shortness of breath     hx lung ca   . Thyroid adenoma     ?  [name] Arthritis   . Lung cancer dx'd 2002  . Metastasis to brain dx'd 2008  . Metastasis to lymph nodes dx'd 09/2011    ALLERGIES:   has no known allergies.  MEDICATIONS:  Current Outpatient Prescriptions  Medication Sig Dispense Refill  . alendronate (FOSAMAX) 70 MG tablet Take 70 mg by mouth every 7 (seven) days. Saturdays; Take with a full glass of water on an empty stomach.      . Ascorbic Acid (VITAMIN C PO) Take 1 tablet by mouth daily.      [name] aspirin EC 81 MG tablet Take 81 mg by mouth every other day.       . Cholecalciferol (VITAMIN D3) 2000 UNITS TABS Take 2,000 Int'l Units by mouth daily.       [name] donepezil (ARICEPT) 10 MG tablet Take 10 mg by mouth at bedtime.       . erlotinib (TARCEVA) 150 MG tablet Take 1 tablet (150 mg total) by mouth at bedtime. Take on an empty stomach 1 hour before meals or 2 hours after.  30 tablet  1  . Multiple Vitamin (MULITIVITAMIN WITH MINERALS) TABS Take 1 tablet by mouth daily.      . naphazoline-pheniramine (NAPHCON-A) 0.025-0.3 % ophthalmic solution Place 1 drop into both eyes 4 (four) times daily as needed. For dry eyes        SURGICAL HISTORY:  Past Surgical History  Procedure Date  . Brain  surgery     tumor  . Lung lobectomy     left ca  . Radical neck dissection 10/18/2011    Procedure: RADICAL NECK DISSECTION;  Surgeon: Serena Colonel, MD;  Location: [facility] OR;  Service: ENT;  Laterality: Right;  RIGHT MODIFIED NECK DISSECTION /POSSIBLE RIGHT THYROIDECTOM  . Thyroidectomy 10/18/2011    Procedure: THYROIDECTOMY;  Surgeon: Serena Colonel, MD;  Location: [facility] OR;  Service: ENT;  Laterality: Right;    REVIEW OF SYSTEMS:  A comprehensive review of systems was negative.   PHYSICAL EXAMINATION: General appearance: alert, cooperative and no distress Head: Normocephalic, without obvious abnormality, atraumatic Neck: no adenopathy Lymph nodes: Cervical,  supraclavicular, and axillary nodes normal. Resp: clear to auscultation bilaterally Cardio: regular rate and rhythm, S1, S2 normal, no murmur, click, rub or gallop GI: soft, non-tender; bowel sounds normal; no masses,  no organomegaly Extremities: extremities normal, atraumatic, no cyanosis or edema Neurologic: Alert and oriented X 3, normal strength and tone. Normal symmetric reflexes. Normal coordination and gait  ECOG PERFORMANCE STATUS: 0 - Asymptomatic  Blood pressure 123/80, pulse 86, temperature 97.2 F (36.2 C), temperature source Oral, height 5\' 9"  (1.753 m), weight 205 lb 11.2 oz (93.305 kg).  LABORATORY DATA: Lab Results  Component Value Date   WBC 3.9 02/28/2012   HGB 13.5 02/28/2012   HCT 40.6 02/28/2012   MCV 91.8 02/28/2012   PLT 224 02/28/2012      Chemistry      Component Value Date/Time   NA 138 02/28/2012 0939   NA 140 01/23/2012 0941   K 4.6 02/28/2012 0939   K 3.6 01/23/2012 0941   CL 99 02/28/2012 0939   CL 105 01/23/2012 0941   CO2 28 02/28/2012 0939   CO2 26 01/23/2012 0941   BUN 12 02/28/2012 0939   BUN 11 01/23/2012 0941   CREATININE 1.0 02/28/2012 0939   CREATININE 0.83 01/23/2012 0941      Component Value Date/Time   CALCIUM 9.1 02/28/2012 0939   CALCIUM 9.5 01/23/2012 0941   ALKPHOS 80 02/28/2012 0939   ALKPHOS 72 01/23/2012 0941   AST 20 02/28/2012 0939   AST 17 01/23/2012 0941   ALT 17 01/23/2012 0941   BILITOT 1.30 02/28/2012 0939   BILITOT 1.3* 01/23/2012 0941       RADIOGRAPHIC STUDIES: Ct Soft Tissue Neck W Contrast  02/28/2012  *RADIOLOGY REPORT*  Clinical Data: Metastatic lung cancer  CT NECK WITH CONTRAST  Technique:  Multidetector CT imaging of the neck was performed with intravenous contrast.  Contrast: OMNIPAQUE IOHEXOL 300 MG/ML  SOLN  Comparison: CT neck 12/10/2011  Findings: Prior surgical removal of a supraclavicular and lymph node on the right.  There is postoperative change in this area without recurrent mass.  The right thyroid has been removed.   Small nodules in the left thyroid lobe are present measuring under 5 mm. Nodules are  slightly larger than on the prior study.  Left supraclavicular lymph node lateral to the jugular vein is unchanged measuring 12 x 5 mm.  No new adenopathy is present in the neck.  Asymmetry of the parotid gland is unchanged.  There is fatty replacement of the parotid gland on the right which may be due to radiation change.  Submandibular glands are normal.  Larynx is symmetric and normal.  Oral cavity and tone are normal.  Para pharyngeal soft tissues are symmetric.  IMPRESSION: Postop changes in the right neck without recurrent adenopathy.  Stable left supraclavicular lymph node.  Original Report Authenticated By: Camelia Phenes, M.D.   Ct Chest W Contrast  02/28/2012  *RADIOLOGY REPORT*  Clinical Data:  Lung cancer diagnosed in 2002, 2008, and 2013. Radiation therapy complete.  Partial thyroidectomy.  Chemotherapy ongoing.  No current complaints.  CT CHEST, ABDOMEN AND PELVIS WITH CONTRAST  Technique: Contiguous axial images of the chest abdomen and pelvis were obtained after IV contrast administration.  Contrast: 100  ml Omnipaque-300  Comparison: 12/10/2011   CT CHEST  Findings: Lung windows demonstrate surgical changes within the left lower lobe.  Stable 4 mm right upper lobe lung nodule on image 21. A right lower lobe nodule is similar and subpleural in position, and 3 mm on image 29.  A left lung nodule anteriorly measures 3 mm on image 16. Minimal nodularity more inferiorly in the lateral left lung on image 25 is similar. A left lung base 5 mm nodule is similar on image 30.  A central right upper lobe nodule measures 7 mm on image 19 versus 6 mm on the prior.  Present 08/20/2011.  Similar configuration of left peri mediastinal volume loss and traction bronchiectasis, likely related to radiation fibrosis.  Soft tissue windows demonstrate status post right thyroidectomy. Tortuous descending thoracic aorta.  Mild  cardiomegaly, with trace pericardial fluid which is similar.  A left-sided pleural effusion is tiny and not significantly changed. No central pulmonary embolism, on this non-dedicated study.  No mediastinal or hilar adenopathy.  The esophagus is mildly dilated with a fluid level within.  Example image 24.  IMPRESSION:  1.  Surgical changes of left lower lobectomy.  Radiation changes throughout the left hemithorax, without evidence of locally recurrent disease. 2.  Nonspecific bilateral pulmonary nodules are primarily similar. A right upper lobe nodule measures similar to 1 mm larger.  This warrants followup attention. 3.  Similar small left-sided pleural effusion. 4. Esophageal air fluid level suggests dysmotility or gastroesophageal reflux.   CT ABDOMEN AND PELVIS  Findings:  A tiny left liver lobe lesion which is similar and likely a small cyst.  Normal spleen, stomach, pancreas, gallbladder, biliary tract, adrenal glands, kidneys.  The left renal collecting system is partially duplicated. No retroperitoneal or retrocrural adenopathy.  Normal colon, appendix, and terminal ileum.  Minimal motion degradation mid abdomen. Normal small bowel without abdominal ascites.    No pelvic adenopathy.  Normal urinary bladder, without adnexal masses significant free pelvic fluid.  Atypical contour to the uterine fundus is suspicious for a 3.3 cm fibroid.  Example sagittal image 55.  This is unchanged.  Mild osteopenia.  Left-sided rib surgical defects.  A disc bulge at L4-L5.  IMPRESSION:  1. No acute process or evidence of metastatic disease in the abdomen or pelvis. 2.  Suspect uterine fundal fibroid.  Original Report Authenticated By: Consuello Bossier, M.D.     ASSESSMENT: This is a very pleasant 66 years old [place] American female with metastatic non-small cell lung cancer currently on treatment with Tarceva 150 mg by mouth daily since October of 2008 with no evidence for disease progression.   PLAN: I discussed the  scan results with the patient and her husband. I recommended for her to continue treatment with oral Tarceva at the current dose.  She would have repeat CT scan of the neck, chest, abdomen and pelvis in 3 months. She would come back for followup visit at that time. The patient was advised to call me immediately if she has any concerning symptoms in the interval.  All  questions were answered. The patient knows to call the clinic with any problems, questions or concerns. We can certainly see the patient much sooner if necessary.  I spent 15 minutes counseling the patient face to face. The total time spent in the appointment was 25 minutes.

## 2012-03-05 NOTE — Telephone Encounter (Signed)
appts made and  Printed for pt    aom °

## 2012-03-07 ENCOUNTER — Other Ambulatory Visit: Payer: Self-pay | Admitting: *Deleted

## 2012-03-07 DIAGNOSIS — C3430 Malignant neoplasm of lower lobe, unspecified bronchus or lung: Secondary | ICD-10-CM

## 2012-03-07 MED ORDER — ERLOTINIB HCL 150 MG PO TABS: 150.0000 mg | ORAL_TABLET | Freq: Every day | ORAL | Status: DC

## 2012-03-07 NOTE — Telephone Encounter (Signed)
Fax request received from [facility] for tarceva.  Given to Kathlee Nations RN @ Dr. Lamonte Richer desk.

## 2012-03-11 ENCOUNTER — Telehealth: Payer: Self-pay | Admitting: Medical Oncology

## 2012-03-11 ENCOUNTER — Telehealth: Payer: Self-pay | Admitting: *Deleted

## 2012-03-11 MED ORDER — ERLOTINIB HCL 150 MG PO TABS: 150.0000 mg | ORAL_TABLET | Freq: Every day | ORAL | Status: DC

## 2012-03-11 NOTE — Telephone Encounter (Signed)
Faxed rx to [facility] 

## 2012-03-11 NOTE — Telephone Encounter (Signed)
Received confirmation of prescription shipment.  Tarceva was shipped 03-10-2012 with next business day delivery.

## 2012-03-31 ENCOUNTER — Other Ambulatory Visit: Payer: Self-pay | Admitting: Obstetrics and Gynecology

## 2012-03-31 DIAGNOSIS — Z1231 Encounter for screening mammogram for malignant neoplasm of breast: Secondary | ICD-10-CM

## 2012-04-02 ENCOUNTER — Ambulatory Visit (INDEPENDENT_AMBULATORY_CARE_PROVIDER_SITE_OTHER): Payer: Medicare Other | Admitting: Obstetrics and Gynecology

## 2012-04-02 ENCOUNTER — Encounter: Payer: Self-pay | Admitting: Obstetrics and Gynecology

## 2012-04-02 VITALS — BP 124/72 | Ht 70.0 in | Wt 205.0 lb

## 2012-04-02 DIAGNOSIS — Z01419 Encounter for gynecological examination (general) (routine) without abnormal findings: Secondary | ICD-10-CM

## 2012-04-02 DIAGNOSIS — Z124 Encounter for screening for malignant neoplasm of cervix: Secondary | ICD-10-CM

## 2012-04-02 DIAGNOSIS — Z1151 Encounter for screening for human papillomavirus (HPV): Secondary | ICD-10-CM | POA: Diagnosis not present

## 2012-04-02 NOTE — Progress Notes (Signed)
AEX  Last Pap: 12/2008 WNL: Yes Regular Periods:no Contraception: Post-Menopausal   Monthly Breast exam:no Tetanus<15yrs:no Nl.Bladder Function:yes Daily BMs:no diarrhea caused by medication  Healthy Diet:yes Calcium:yes Mammogram:yes Date of Mammogram: 03/2012 Exercise:yes Have often Exercise: occasional Seatbelt: yes Abuse at home: no Stressful work:no Sigmoid-colonoscopy: 2013 WNL Bone Density: Yes 03/07/2011 followed by Dr. Shirline Frees PCP: Allyne Gee Change in PMH: None Change in [facility]: None Subjective:    Valerie [name] is a 66 y.o. female G2P0 who presents for annual exam.  The patient has no complaints today. She continues with her oral therapy for her brain cancer and has had an interim procedure for removal of a super clavicular lymph node which was positive for cancer. She had a spot radiation for that in the spring of this year. She continues to have diarrhea with some loss of control of her bowels and wears depends on regular basis.  This is considered a side effect of Tarceva  The following portions of the patient's history were reviewed and updated as appropriate: allergies, current medications, past family history, past medical history, past social history, past surgical history and problem list.  Review of Systems Pertinent items are noted in HPI. Gastrointestinal:No change in bowel habits, no abdominal pain, no rectal bleeding Genitourinary:negative for dysuria, frequency, hematuria, nocturia and urinary incontinence    Objective:     BP 124/72  Ht 5\' 10"  (1.778 m)  Wt 205 lb (92.987 kg)  BMI 29.41 kg/m2  Weight:  Wt Readings from Last 1 Encounters:  04/02/12 205 lb (92.987 kg)     BMI: Body mass index is 29.41 kg/(m^2). General Appearance: Alert, appropriate appearance for age. No acute distress HEENT: Grossly normal Neck / Thyroid: Supple, no masses, nodes or enlargement Lungs: clear to auscultation bilaterally Back: No CVA tenderness Breast Exam:  No masses or nodes.No dimpling, nipple retraction or discharge. Cardiovascular: Regular rate and rhythm. S1, S2, no murmur Gastrointestinal: Soft, non-tender, no masses or organomegaly Pelvic Exam: External genitalia: fat pad loss Vaginal: atrophic mucosa Cervix: atrophic with exocervical contact bleeding Adnexa: non palpable Uterus: Does not feel enlarged Exam limited by body habitus Rectovaginal: normal rectal, no masses Lymphatic Exam: Non-palpable nodes in neck, clavicular, axillary, or inguinal regions Skin: no rash or abnormalities Neurologic: Normal gait and speech, no tremor  Psychiatric: Alert and oriented, appropriate affect.    Urinalysis:Not done      Assessment:    Menopause  Lung cancer Brain cancer as metastatic versus second primary Mild dementia   Plan:    Pap with high-risk HPV   Follow-up:  for annual exam

## 2012-04-04 LAB — PAP IG AND HPV HIGH-RISK: HPV DNA High Risk: NOT DETECTED

## 2012-04-09 ENCOUNTER — Telehealth: Payer: Self-pay

## 2012-04-09 ENCOUNTER — Other Ambulatory Visit: Payer: Self-pay

## 2012-04-09 DIAGNOSIS — M810 Age-related osteoporosis without current pathological fracture: Secondary | ICD-10-CM

## 2012-04-09 MED ORDER — ALENDRONATE SODIUM 70 MG PO TABS: 70.0000 mg | ORAL_TABLET | ORAL | Status: DC

## 2012-04-09 NOTE — Telephone Encounter (Signed)
[facility] @ [phone] to RF Alendronate 70 mg sig 1PO weekly #4 with 12 RF's per [facility].  Melody Comas A

## 2012-04-10 ENCOUNTER — Encounter: Payer: Self-pay | Admitting: *Deleted

## 2012-04-10 NOTE — Progress Notes (Signed)
RECEIVED A FAX FROM [facility] CONCERNING A CONFIRMATION OF PRESCRIPTION SHIPMENT FOR TARCEVA ON 04/09/12.

## 2012-04-22 ENCOUNTER — Ambulatory Visit: Payer: Medicare Other

## 2012-04-28 ENCOUNTER — Other Ambulatory Visit: Payer: Self-pay

## 2012-04-28 DIAGNOSIS — Z139 Encounter for screening, unspecified: Secondary | ICD-10-CM

## 2012-05-07 ENCOUNTER — Ambulatory Visit: Payer: Medicare Other

## 2012-05-08 ENCOUNTER — Ambulatory Visit
Admission: RE | Admit: 2012-05-08 | Discharge: 2012-05-08 | Disposition: A | Discharge status: Home / Self Care | Payer: Medicare Other | Source: Ambulatory Visit | Attending: Obstetrics and Gynecology | Admitting: Obstetrics and Gynecology

## 2012-05-08 ENCOUNTER — Other Ambulatory Visit: Payer: Self-pay | Admitting: *Deleted

## 2012-05-08 DIAGNOSIS — Z1231 Encounter for screening mammogram for malignant neoplasm of breast: Secondary | ICD-10-CM

## 2012-05-08 NOTE — Telephone Encounter (Signed)
Tarceva shipped 05/07/12

## 2012-05-26 IMAGING — CT CT CHEST W/ CM
2 of 5 series · 15 of 30 positions shown, 18 images · IV contrast (omnipaque)
Comparison: CT 02/06/2010

CT HEAD

CLINICAL DATA: Lung cancer.  Tarsi in progress.

CT HEAD WITHOUT AND WITH CONTRAST
CT CHEST, ABDOMEN AND PELVIS WITH CONTRAST
TECHNIQUE: Multidetector CT imaging of the head was performed
following the standard protocol before and after bolus injection of
intravenous contrast. Multidetector CT imaging of the chest,
abdomen and pelvis was performed following the standard protocol
during bolus administration of intravenous contrast.
Contrast: 100 ml Omnipaque 300

[Series 5: cap with st · axial · 0.79mm/px · z∈[+629,+1184]mm · 12 of 135 slices shown, 15 images]
[im 12/135  mediastinal]
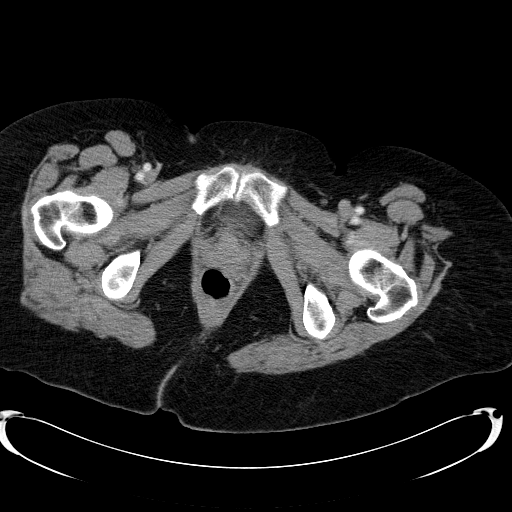
[im 12/135  lung]
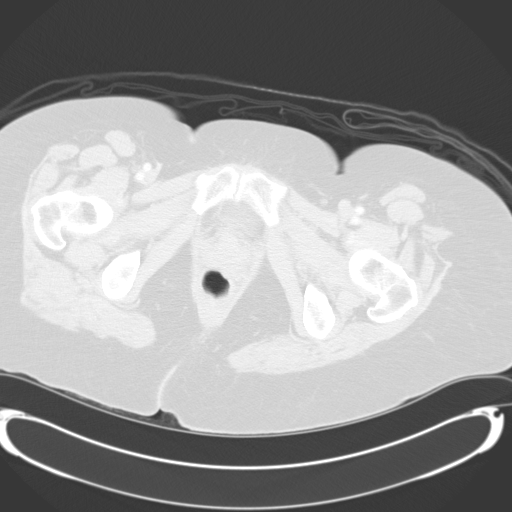
[im 23/135  lung]
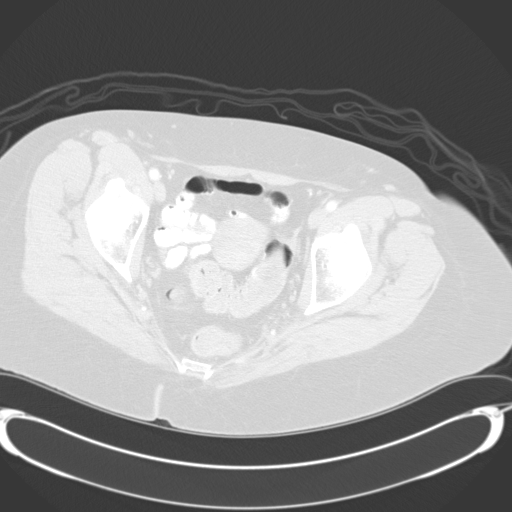
[im 34/135  lung]
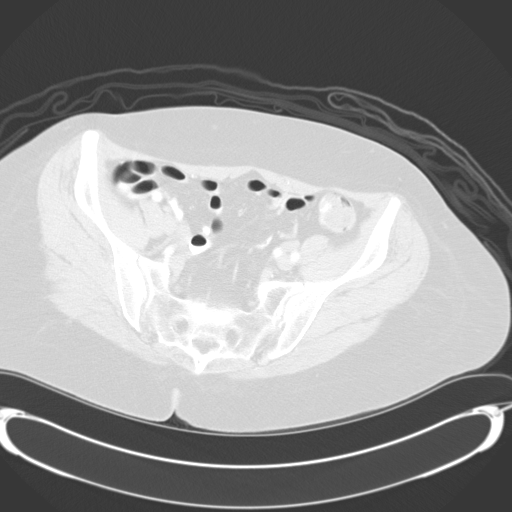
[im 45/135  lung]
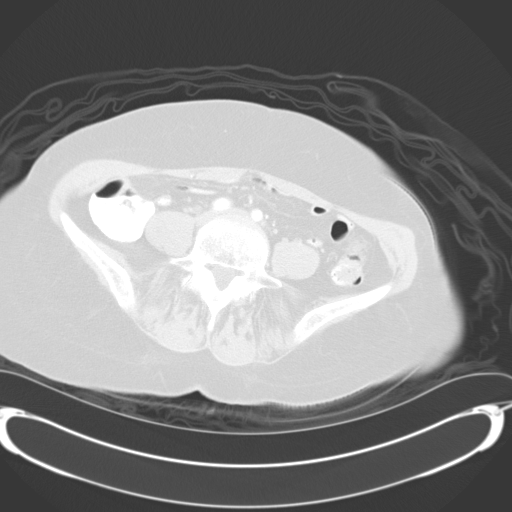
[im 56/135  mediastinal]
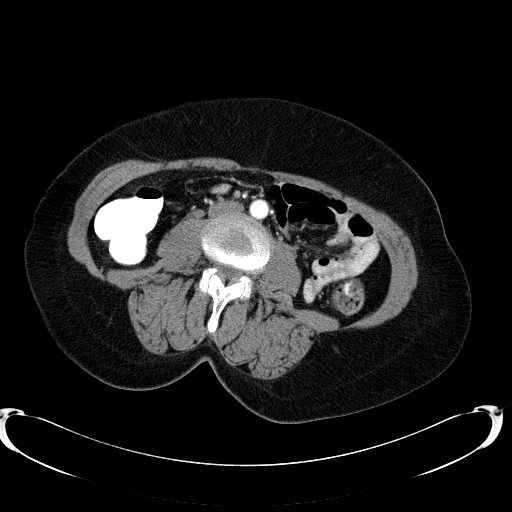
[im 56/135  lung]
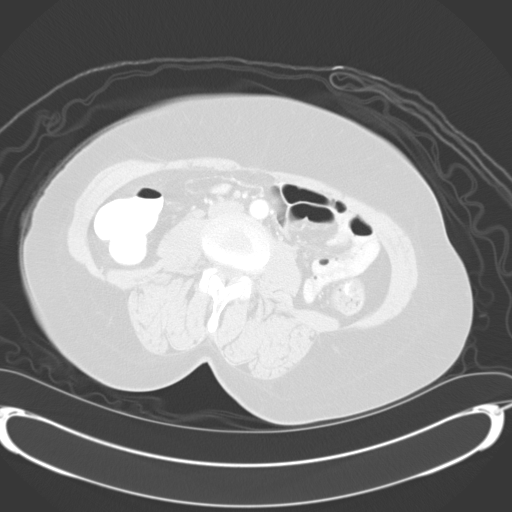
[im 68/135  lung]
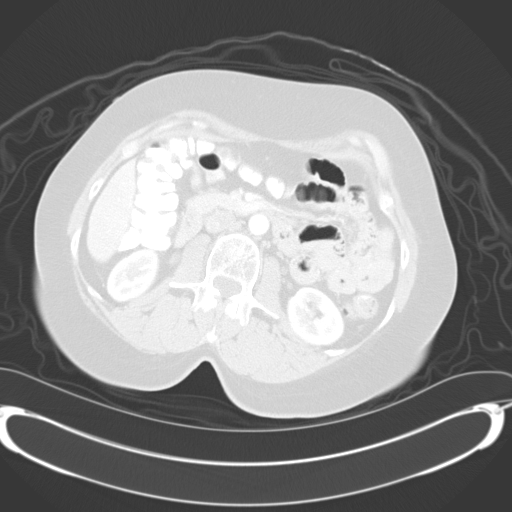
[im 79/135  lung]
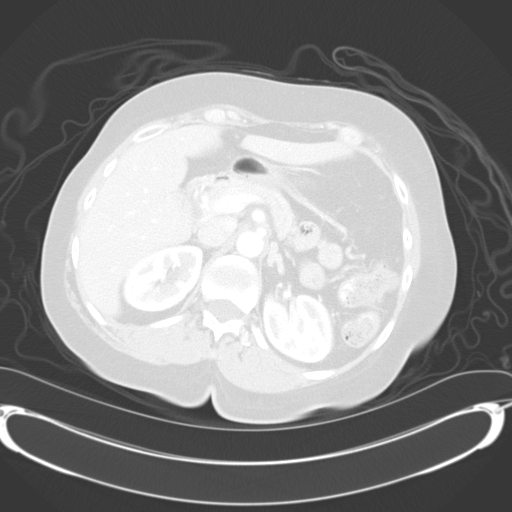
[im 90/135  lung]
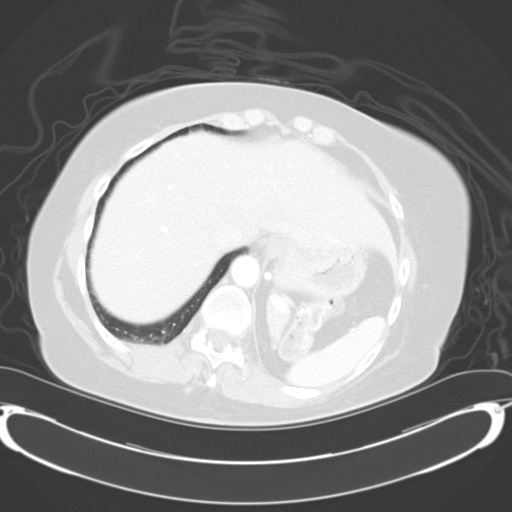
[im 93/135  mediastinal]
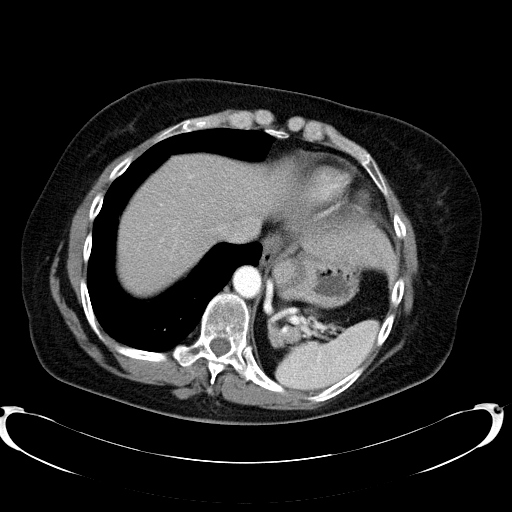
[im 93/135  lung]
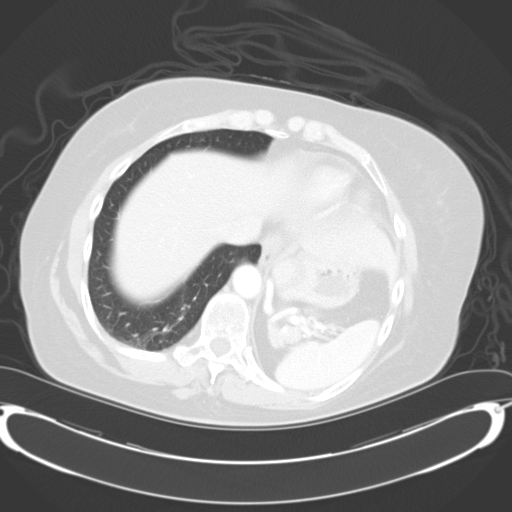
[im 101/135  lung]
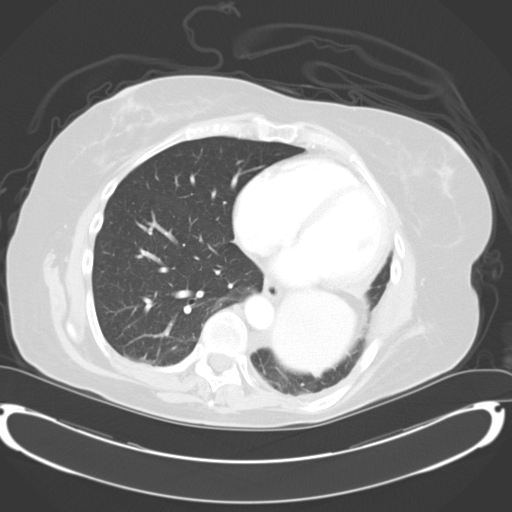
[im 112/135  lung]
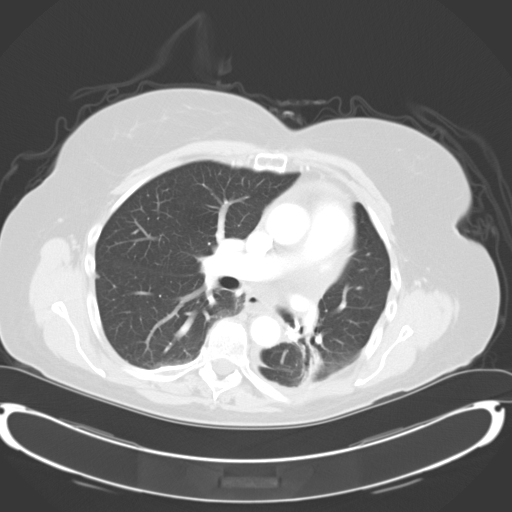
[im 123/135  lung]
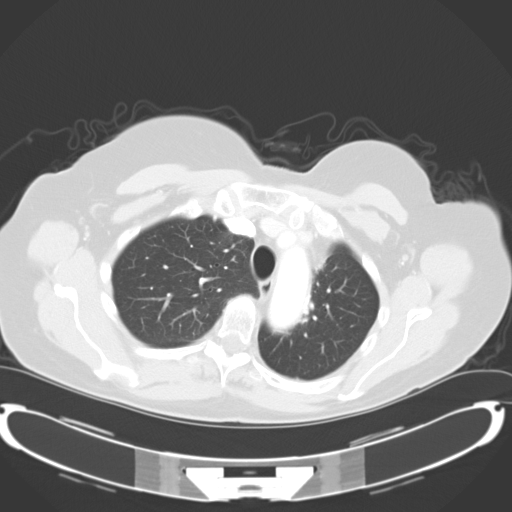

[Series 9: lung windows · axial · 0.79mm/px · z∈[+1034,+1109]mm · 3 of 54 slices shown]
[im 12/54  lung]
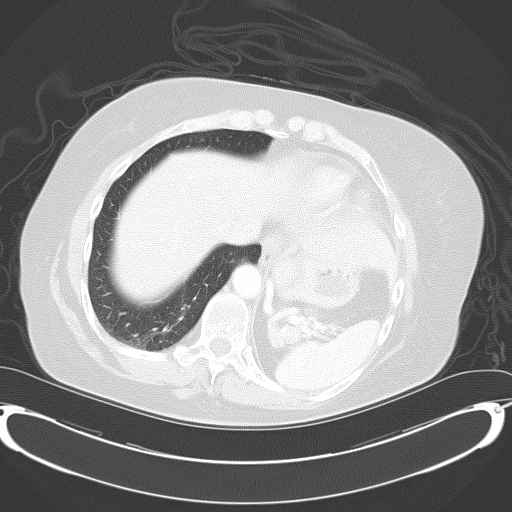
[im 14/54  lung]
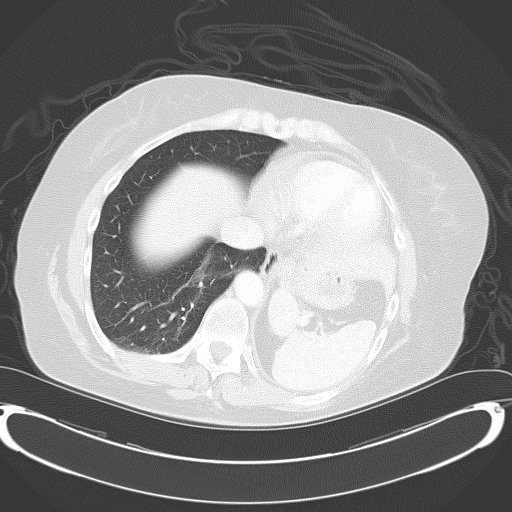
[im 27/54  lung]
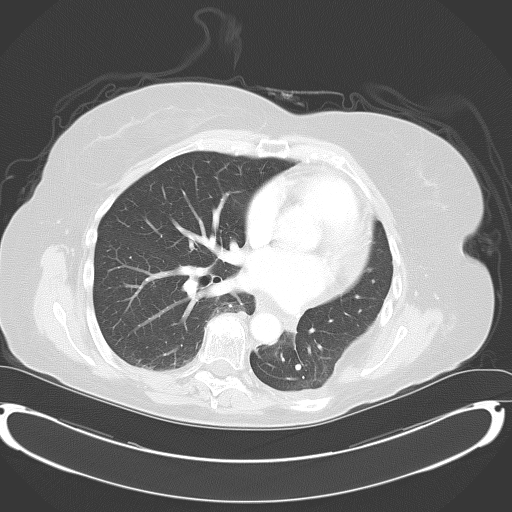

[15 of 30 positions shown; findings below may reference images not displayed]

FINDINGS: There is small focus of nodular dural enhancement
adjacent to the left parietal craniotomy site measuring 5 mm (image
17).  This corresponds to a small focus of enhancement on
comparison MRI from 08/23/2009.  There  may be slight enlargement
of this nodule   however difficult to compare between studies.  No
additional foci of enhancement within the brain parenchyma,
ventricles, or meningeal surfaces.

Review of the calvarium demonstrates no aggressive osseous lesions.
Orbits are normal.
IMPRESSION: Small enhancing nodule adjacent to the craniotomy site in the left
parietal lobe subjectively is slightly thicker compared to MRI.
Consider follow-up MRI with contrast to compared to MRI study of
08/23/2009.

CT CHEST
FINDINGS: No evidence of axillary or supraclavicular
lymphadenopathy.  No mediastinal or hilar lymphadenopathy.  There
is postsurgical change in the left hemithorax which is stable
compared to prior.  No evidence of nodularity along the surgical
clips in the left infrahilar location.  There is some linear
bronchiectasis and scarring likely relate to therapy.  There is
some pleural thickening in the left lower lobe which is also
unchanged.  This is adjacent to the thoracotomy site.  Within the
right upper lobe, small 4 mm pulmonary nodule (image 18) is
unchanged from prior.
IMPRESSION: 1.  No evidence of lung cancer recurrence within the thorax.
2.  Stable postoperative change in the left hemithorax.
3.  Stable small right pulmonary nodule.

CT ABDOMEN AND PELVIS
FINDINGS: No focal hepatic lesion.  The gallbladder, pancreas,
spleen, adrenal glands, and kidneys are normal.

The stomach, small bowel, and appendix, cecum are normal.  The
colon and rectosigmoid colon are normal.

Abdominal aorta is normal caliber.  No evidence retroperitoneal
periportal lymphadenopathy.

No free fluid the pelvis.  The bladder and uterus are normal.  No
evidence of pelvic lymphadenopathy. Review of  bone windows
demonstrates no aggressive osseous lesions.
IMPRESSION: 1.  No evidence of metastasis in the abdomen or  pelvis.
2.  No evidence skeletal metastasis.

## 2012-06-03 ENCOUNTER — Other Ambulatory Visit (HOSPITAL_BASED_OUTPATIENT_CLINIC_OR_DEPARTMENT_OTHER): Payer: Medicare Other | Admitting: Lab

## 2012-06-03 ENCOUNTER — Ambulatory Visit (HOSPITAL_COMMUNITY)
Admission: RE | Admit: 2012-06-03 | Discharge: 2012-06-03 | Disposition: A | Discharge status: Home / Self Care | Payer: Medicare Other | Source: Ambulatory Visit | Attending: Internal Medicine | Admitting: Internal Medicine

## 2012-06-03 DIAGNOSIS — R918 Other nonspecific abnormal finding of lung field: Secondary | ICD-10-CM | POA: Insufficient documentation

## 2012-06-03 DIAGNOSIS — Z923 Personal history of irradiation: Secondary | ICD-10-CM | POA: Insufficient documentation

## 2012-06-03 DIAGNOSIS — R911 Solitary pulmonary nodule: Secondary | ICD-10-CM

## 2012-06-03 DIAGNOSIS — Z79899 Other long term (current) drug therapy: Secondary | ICD-10-CM | POA: Insufficient documentation

## 2012-06-03 DIAGNOSIS — D259 Leiomyoma of uterus, unspecified: Secondary | ICD-10-CM | POA: Insufficient documentation

## 2012-06-03 DIAGNOSIS — C3490 Malignant neoplasm of unspecified part of unspecified bronchus or lung: Secondary | ICD-10-CM

## 2012-06-03 DIAGNOSIS — Z902 Acquired absence of lung [part of]: Secondary | ICD-10-CM | POA: Insufficient documentation

## 2012-06-03 DIAGNOSIS — K7689 Other specified diseases of liver: Secondary | ICD-10-CM | POA: Insufficient documentation

## 2012-06-03 DIAGNOSIS — C801 Malignant (primary) neoplasm, unspecified: Secondary | ICD-10-CM | POA: Diagnosis not present

## 2012-06-03 DIAGNOSIS — E041 Nontoxic single thyroid nodule: Secondary | ICD-10-CM | POA: Insufficient documentation

## 2012-06-03 HISTORY — DX: Solitary pulmonary nodule: R91.1

## 2012-06-03 LAB — CBC WITH DIFFERENTIAL/PLATELET
BASO%: 0.6 % (ref 0.0–2.0)
Basophils Absolute: 0 10*3/uL (ref 0.0–0.1)
EOS%: 3 % (ref 0.0–7.0)
Eosinophils Absolute: 0.1 10*3/uL (ref 0.0–0.5)
HCT: 38.6 % (ref 34.8–46.6)
HGB: 13.1 g/dL (ref 11.6–15.9)
LYMPH%: 17.8 % (ref 14.0–49.7)
MCH: 31.2 pg (ref 25.1–34.0)
MCHC: 33.9 g/dL (ref 31.5–36.0)
MCV: 92 fL (ref 79.5–101.0)
MONO#: 0.4 10*3/uL (ref 0.1–0.9)
MONO%: 9.6 % (ref 0.0–14.0)
NEUT#: 2.8 10*3/uL (ref 1.5–6.5)
NEUT%: 69 % (ref 38.4–76.8)
Platelets: 211 10*3/uL (ref 145–400)
RBC: 4.2 10*6/uL (ref 3.70–5.45)
RDW: 14 % (ref 11.2–14.5)
WBC: 4 10*3/uL (ref 3.9–10.3)
lymph#: 0.7 10*3/uL — ABNORMAL LOW (ref 0.9–3.3)

## 2012-06-03 LAB — COMPREHENSIVE METABOLIC PANEL (CC13)
ALT: 17 U/L (ref 0–55)
AST: 14 U/L (ref 5–34)
Albumin: 3.9 g/dL (ref 3.5–5.0)
Alkaline Phosphatase: 86 U/L (ref 40–150)
BUN: 10 mg/dL (ref 7.0–26.0)
CO2: 25 mEq/L (ref 22–29)
Calcium: 9.8 mg/dL (ref 8.4–10.4)
Chloride: 104 mEq/L (ref 98–107)
Creatinine: 0.9 mg/dL (ref 0.6–1.1)
Glucose: 97 mg/dl (ref 70–99)
Potassium: 3.5 mEq/L (ref 3.5–5.1)
Sodium: 139 mEq/L (ref 136–145)
Total Bilirubin: 1.6 mg/dL — ABNORMAL HIGH (ref 0.20–1.20)
Total Protein: 6.5 g/dL (ref 6.4–8.3)

## 2012-06-03 MED ORDER — IOHEXOL 300 MG/ML  SOLN
100.0000 mL | Freq: Once | INTRAMUSCULAR | Status: AC | PRN
  Administered 2012-06-03: 100 mL via INTRAVENOUS

## 2012-06-05 ENCOUNTER — Ambulatory Visit (HOSPITAL_BASED_OUTPATIENT_CLINIC_OR_DEPARTMENT_OTHER): Payer: Medicare Other | Admitting: Internal Medicine

## 2012-06-05 ENCOUNTER — Telehealth: Payer: Self-pay | Admitting: Internal Medicine

## 2012-06-05 ENCOUNTER — Other Ambulatory Visit: Payer: Self-pay | Admitting: *Deleted

## 2012-06-05 ENCOUNTER — Ambulatory Visit (HOSPITAL_BASED_OUTPATIENT_CLINIC_OR_DEPARTMENT_OTHER): Payer: Medicare Other

## 2012-06-05 VITALS — BP 128/77 | HR 89 | Temp 96.8°F | Resp 20 | Ht 70.0 in | Wt 203.4 lb

## 2012-06-05 DIAGNOSIS — C7931 Secondary malignant neoplasm of brain: Secondary | ICD-10-CM

## 2012-06-05 DIAGNOSIS — C3430 Malignant neoplasm of lower lobe, unspecified bronchus or lung: Secondary | ICD-10-CM | POA: Diagnosis not present

## 2012-06-05 DIAGNOSIS — C7949 Secondary malignant neoplasm of other parts of nervous system: Secondary | ICD-10-CM

## 2012-06-05 DIAGNOSIS — C719 Malignant neoplasm of brain, unspecified: Secondary | ICD-10-CM

## 2012-06-05 DIAGNOSIS — C3490 Malignant neoplasm of unspecified part of unspecified bronchus or lung: Secondary | ICD-10-CM

## 2012-06-05 DIAGNOSIS — Z23 Encounter for immunization: Secondary | ICD-10-CM

## 2012-06-05 MED ORDER — INFLUENZA VIRUS VACC SPLIT PF IM SUSP
0.5000 mL | Freq: Once | INTRAMUSCULAR | Status: AC
  Administered 2012-06-05: 0.5 mL via INTRAMUSCULAR
  Filled 2012-06-05: qty 0.5

## 2012-06-05 NOTE — Telephone Encounter (Signed)
THIS REFILL REQUEST FOR TARCEVA WAS GIVEN TO DR.MOHAMED. 

## 2012-06-05 NOTE — Telephone Encounter (Signed)
gv pt appt schedule for January 2014 including ct scan. Pt will arrive 2hrs early for ct to drink water based prep.

## 2012-06-05 NOTE — Progress Notes (Signed)
Valerie [name] [phone]   [phone]  OFFICE PROGRESS NOTE  [name], MD 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   PRIOR THERAPY:  1. Status post left lower lobectomy under the care of Dr Edwyna Shell in October 2002. 2. Status post course of concurrent chemoradiation with weekly carboplatin and paclitaxel under the care of Dr Truett Perna and Dr Dorna Bloom in early 2003. 3. Status post treatment with Celebrex and Iressa according to the clinical trial at [name] for a total of 1 year. 4. Status post left occipital craniotomy for tumor resection on September 21, 2005, under the care of Dr Franky Macho. 5. Status post whole brain irradiation under the care of Dr Dorna Bloom, completed November 07, 2005. 6. Status post gamma knife treatment to the resected cavitary recurrence on June 02, 2008, at [facility]. 7. Status post right thyroidectomy with radical neck dissection under the care of Dr. Pollyann Kennedy on 10/18/2011.  CURRENT THERAPY: Tarceva 150 mg by mouth daily with therapy beginning 06/03/2007.  INTERVAL HISTORY: Valerie [name] 66 y.o. female returns to the clinic today for three-month followup visit. The patient is tolerating her treatment with Tarceva fairly well with no significant adverse effects. She continues to have occasional episodes of diarrhea every now and then but not on daily basis. She denied having any significant skin rash. The patient denied having any significant weight loss or night sweats. She denied having any significant chest pain, shortness breath, cough or hemoptysis. She has repeat CT scan of the neck, chest, abdomen and pelvis performed recently and she is here for evaluation and discussion of her scan results.  MEDICAL HISTORY: Past Medical History  Diagnosis Date  . Heart  murmur   . Shortness of breath     hx lung ca   . Thyroid adenoma     ?  [name] Arthritis   . Lung cancer dx'd 2002  . Metastasis to brain dx'd 2008  . 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 Anal fistula   . H/O osteoporosis   . H/O vitamin D deficiency   . History of measles, mumps, or rubella   . H/O varicella   . Hypertension   . Yeast infection   . Abnormal Pap smear 2006  . Atrophic vaginitis 2008  . Dyspareunia 2008  . Dementia 2009  . Osteoporosis 2010    ALLERGIES:   has no known allergies.  MEDICATIONS:  Current Outpatient Prescriptions  Medication Sig Dispense Refill  . alendronate (FOSAMAX) 70 MG tablet Take 1 tablet (70 mg total) by mouth every 7 (seven) days. Saturdays; Take with a full glass of water on an empty stomach.  4 tablet  12  . alendronate (FOSAMAX) 70 MG tablet Take 1 tablet (70 mg total) by mouth every 7 (seven) days. Take with a full glass of water on an empty stomach.  4 tablet  12  . alendronate (FOSAMAX) 70 MG tablet Take 1 tablet (70 mg total) by mouth every 7 (seven) days. Saturdays; Take with a full glass of water on an empty stomach.  4 tablet  12  . Ascorbic Acid (VITAMIN C PO) Take 1 tablet by mouth daily.      [name] aspirin EC 81 MG tablet Take 81 mg by mouth every other day.       . Cholecalciferol (VITAMIN  D3) 2000 UNITS TABS Take 2,000 Int'l Units by mouth daily.       [name] donepezil (ARICEPT) 10 MG tablet Take 10 mg by mouth at bedtime.       . erlotinib (TARCEVA) 150 MG tablet Take 1 tablet (150 mg total) by mouth daily. Take on an empty stomach 1 hour before meals or 2 hours after.  30 tablet  2  . Multiple Vitamin (MULITIVITAMIN WITH MINERALS) TABS Take 1 tablet by mouth daily.      . naphazoline-pheniramine (NAPHCON-A) 0.025-0.3 % ophthalmic solution Place 1 drop into both eyes 4 (four) times daily as needed. For dry eyes        SURGICAL HISTORY:  Past Surgical History  Procedure Date  . Brain surgery     tumor  . Lung lobectomy      left ca  . Radical neck dissection 10/18/2011    Procedure: RADICAL NECK DISSECTION;  Surgeon: Serena Colonel, MD;  Location: [name] OR;  Service: ENT;  Laterality: Right;  RIGHT MODIFIED NECK DISSECTION /POSSIBLE RIGHT THYROIDECTOM  . Thyroidectomy 10/18/2011    Procedure: THYROIDECTOMY;  Surgeon: Serena Colonel, MD;  Location: [name];  Service: ENT;  Laterality: Right;    REVIEW OF SYSTEMS:  A comprehensive review of systems was negative.   PHYSICAL EXAMINATION: General appearance: alert, cooperative and no distress Head: Normocephalic, without obvious abnormality, atraumatic Neck: no adenopathy Lymph nodes: Cervical, supraclavicular, and axillary nodes normal. Resp: clear to auscultation bilaterally Cardio: regular rate and rhythm, S1, S2 normal, no murmur, click, rub or gallop GI: soft, non-tender; bowel sounds normal; no masses,  no organomegaly Extremities: extremities normal, atraumatic, no cyanosis or edema Neurologic: Alert and oriented X 3, normal strength and tone. Normal symmetric reflexes. Normal coordination and gait  ECOG PERFORMANCE STATUS: 0 - Asymptomatic  Blood pressure 128/77, pulse 89, temperature 96.8 F (36 C), temperature source Oral, resp. rate 20, height 5\' 10"  (1.778 m), weight 203 lb 6.4 oz (92.262 kg).  LABORATORY DATA: Lab Results  Component Value Date   WBC 4.0 06/03/2012   HGB 13.1 06/03/2012   HCT 38.6 06/03/2012   MCV 92.0 06/03/2012   PLT 211 06/03/2012      Chemistry      Component Value Date/Time   NA 139 06/03/2012 0836   NA 138 02/28/2012 0939   NA 140 01/23/2012 0941   K 3.5 06/03/2012 0836   K 4.6 02/28/2012 0939   K 3.6 01/23/2012 0941   CL 104 06/03/2012 0836   CL 99 02/28/2012 0939   CL 105 01/23/2012 0941   CO2 25 06/03/2012 0836   CO2 28 02/28/2012 0939   CO2 26 01/23/2012 0941   BUN 10.0 06/03/2012 0836   BUN 12 02/28/2012 0939   BUN 11 01/23/2012 0941   CREATININE 0.9 06/03/2012 0836   CREATININE 1.0 02/28/2012 0939   CREATININE 0.83 01/23/2012  0941      Component Value Date/Time   CALCIUM 9.8 06/03/2012 0836   CALCIUM 9.1 02/28/2012 0939   CALCIUM 9.5 01/23/2012 0941   ALKPHOS 86 06/03/2012 0836   ALKPHOS 80 02/28/2012 0939   ALKPHOS 72 01/23/2012 0941   AST 14 06/03/2012 0836   AST 20 02/28/2012 0939   AST 17 01/23/2012 0941   ALT 17 06/03/2012 0836   ALT 17 01/23/2012 0941   BILITOT 1.60* 06/03/2012 0836   BILITOT 1.30 02/28/2012 0939   BILITOT 1.3* 01/23/2012 0941       RADIOGRAPHIC STUDIES: Ct Soft Tissue Neck  W Contrast  06/03/2012  *RADIOLOGY REPORT*  Clinical Data: Metastatic lung cancer with right supraclavicular lymph node removal.  Previous right thyroidectomy.  CT NECK WITH CONTRAST  Technique:  Multidetector CT imaging of the neck was performed with intravenous contrast.  Contrast: OMNIPAQUE IOHEXOL 300 MG/ML  SOLN  Comparison: 02/28/2012.  12/10/2011.  Findings: Slight interval regression 11 x 5 mm left internal jugular noted (image 58 series 4). Loss of fat planes on the right related to right thyroidectomy and right supraclavicular node resection from metastatic lung cancer. Right thyroidectomy with stable appearing left thyroid gland containing a few tiny cysts. Airway midline.  Fatty replaced right parotid gland relates to previous XRT.  No pathologic adenopathy.  No neck masses or mucosal lesions. Slight asymmetry base of tongue  without definitive mass lesion.  Carotid arteries widely patent with only mild nonstenotic posterior wall calcific plaque bilaterally.  No worrisome osseous lesions.  Right maxillary sinus opacity likely radicular cyst related to right maxillary molar roots.  IMPRESSION: Stable CT neck.  No pathologic lymphadenopathy based on short axis measurements.   Original Report Authenticated By: Elsie Stain, M.D.    Ct Chest W Contrast  06/03/2012  *RADIOLOGY REPORT*  Clinical Data:  Metastatic lung cancer, tarceva ongoing, XRT complete  CT CHEST, ABDOMEN AND PELVIS WITH CONTRAST  Technique:   Multidetector CT imaging of the chest, abdomen and pelvis was performed following the standard protocol during bolus administration of intravenous contrast.  Contrast: OMNIPAQUE IOHEXOL 300 MG/ML  SOLN  Comparison:  02/28/2012  CT CHEST  Findings:  Status post left lower lobectomy.  Additional left paramediastinal radiation changes.  8 mm central right upper lobe pulmonary nodule (series 7/image 21), slowly growing across prior studies, suspicious.  Additional scattered bilateral pulmonary nodules measuring up to 5 mm (series 7/image 33), unchanged.  Trace left pleural effusion. No pneumothorax.  Status post right thyroidectomy.  Visualized left thyroid is mildly nodular.  The heart is top normal in size.  No pericardial effusion.  No suspicious mediastinal, hilar, or axillary lymphadenopathy.  Mild degenerative changes of the thoracic spine.  IMPRESSION: 8 mm central right upper lobe pulmonary nodule, slowly growing across prior studies, suspicious.  Continued attention on follow-up is suggested.  Status post left lower lobectomy with left paramediastinal radiation changes.  CT ABDOMEN AND PELVIS  Findings:  Tiny hypoenhancing lesion in the lateral segment left hepatic lobe (series 2/image 55), likely benign.  Spleen, pancreas, and adrenal glands are within normal limits.  Gallbladder is unremarkable.  No intrahepatic or extrahepatic ductal dilatation.  Kidneys are within normal limits.  No hydronephrosis.  No evidence of bowel obstruction.  Normal appendix.  Colon is decompressed.  No evidence of abdominal aortic aneurysm.  No abdominopelvic ascites.  No suspicious abdominopelvic lymphadenopathy.  Suspected posterior fundal uterine fibroid (series 2/image 115). Bilateral ovaries are unremarkable.  Bladder is within normal limits.  Visualized osseous structures are within normal limits.  IMPRESSION: No evidence of metastatic disease in the abdomen/pelvis.   Original Report Authenticated By: Charline Bills,  M.D.    Ct Abdomen Pelvis W Contrast  06/03/2012  *RADIOLOGY REPORT*  Clinical Data:  Metastatic lung cancer, tarceva ongoing, XRT complete  CT CHEST, ABDOMEN AND PELVIS WITH CONTRAST  Technique:  Multidetector CT imaging of the chest, abdomen and pelvis was performed following the standard protocol during bolus administration of intravenous contrast.  Contrast: OMNIPAQUE IOHEXOL 300 MG/ML  SOLN  Comparison:  02/28/2012  CT CHEST  Findings:  Status post left lower lobectomy.  Additional left paramediastinal radiation changes.  8 mm central right upper lobe pulmonary nodule (series 7/image 21), slowly growing across prior studies, suspicious.  Additional scattered bilateral pulmonary nodules measuring up to 5 mm (series 7/image 33), unchanged.  Trace left pleural effusion. No pneumothorax.  Status post right thyroidectomy.  Visualized left thyroid is mildly nodular.  The heart is top normal in size.  No pericardial effusion.  No suspicious mediastinal, hilar, or axillary lymphadenopathy.  Mild degenerative changes of the thoracic spine.  IMPRESSION: 8 mm central right upper lobe pulmonary nodule, slowly growing across prior studies, suspicious.  Continued attention on follow-up is suggested.  Status post left lower lobectomy with left paramediastinal radiation changes.  CT ABDOMEN AND PELVIS  Findings:  Tiny hypoenhancing lesion in the lateral segment left hepatic lobe (series 2/image 55), likely benign.  Spleen, pancreas, and adrenal glands are within normal limits.  Gallbladder is unremarkable.  No intrahepatic or extrahepatic ductal dilatation.  Kidneys are within normal limits.  No hydronephrosis.  No evidence of bowel obstruction.  Normal appendix.  Colon is decompressed.  No evidence of abdominal aortic aneurysm.  No abdominopelvic ascites.  No suspicious abdominopelvic lymphadenopathy.  Suspected posterior fundal uterine fibroid (series 2/image 115). Bilateral ovaries are unremarkable.  Bladder is  within normal limits.  Visualized osseous structures are within normal limits.  IMPRESSION: No evidence of metastatic disease in the abdomen/pelvis.   Original Report Authenticated By: Charline Bills, M.D.    Mm Digital Screening  05/09/2012  *RADIOLOGY REPORT*  Clinical Data: Screening.  DIGITAL BILATERAL SCREENING MAMMOGRAM WITH CAD  Comparison:  Previous exams.  Findings:  There are scattered fibroglandular densities. No suspicious masses, architectural distortion, or calcifications are present.  Images were processed with CAD.  IMPRESSION: No mammographic evidence of malignancy.  A result letter of this screening mammogram will be mailed directly to the patient.  RECOMMENDATION: Screening mammogram in one year. (Code:SM-B-01Y)  BI-RADS CATEGORY 1:  Negative.   Original Report Authenticated By: Otilio Carpen, M.D.     ASSESSMENT: This is a very pleasant 66 years old [place] American female with metastatic non-small cell lung cancer, adenocarcinoma. The patient is currently on treatment with Tarceva 150 mg by mouth daily.  PLAN: She is tolerating her treatment fairly well with no significant adverse effects. Her CT scan of the chest showed a slowly growing central right upper lobe lung nodule which is currently 8 mm compared to 7 mm on the previous scan. I discussed the scan results with the patient today. I recommended for her to continue on treatment with Tarceva with the same dose for now. I would see her back for followup visit in 3 months with repeat CT scan of the head, neck, chest abdomen and pelvis for restaging of her disease. The patient was advised to call me immediately if she has any concerning symptoms in the interval.  All questions were answered. The patient knows to call the clinic with any problems, questions or concerns. We can certainly see the patient much sooner if necessary.  I spent 15 minutes counseling the patient face to face. The total time spent in the appointment  was 25 minutes.

## 2012-06-08 NOTE — Patient Instructions (Signed)
Your CT scan of the chest, abdomen and pelvis as well as neck showed no significant evidence for disease progression. Continue treatment with Tarceva with the current dose. Followup in 3 months with repeat CT scan of the head, neck, chest abdomen and pelvis

## 2012-06-09 MED ORDER — ERLOTINIB HCL 150 MG PO TABS: 150.0000 mg | ORAL_TABLET | Freq: Every day | ORAL | Status: DC

## 2012-06-09 NOTE — Addendum Note (Signed)
Addended by: Arvilla Meres on: 06/09/2012 04:42 PM   Modules accepted: Orders

## 2012-06-12 NOTE — Telephone Encounter (Signed)
RECEIVED A FAX FROM [employer] CONCERNING A CONFIRMATION OF PRESCRIPTION SHIPMENT FOR TARCEVA ON 06/11/12. 

## 2012-06-16 ENCOUNTER — Telehealth: Payer: Self-pay | Admitting: Medical Oncology

## 2012-06-16 NOTE — Telephone Encounter (Signed)
Concerned that lung nodule has grown and she is apprehensive about waiting 3 months to do CT scan . Wanting to know if she should get radiation tx now. I told her i will notify Dr Donnald Garre of her concerns.

## 2012-06-17 ENCOUNTER — Telehealth: Payer: Self-pay | Admitting: Medical Oncology

## 2012-06-17 DIAGNOSIS — C3490 Malignant neoplasm of unspecified part of unspecified bronchus or lung: Secondary | ICD-10-CM

## 2012-06-17 NOTE — Telephone Encounter (Signed)
Spoke to pt re Dr Arbutus Ped recommendation to observe nodule for now or he will refer to radiation for a consult. Pt requests radiation referral. Referral made per dr Arbutus Ped

## 2012-06-19 ENCOUNTER — Telehealth: Payer: Self-pay | Admitting: *Deleted

## 2012-06-19 NOTE — Telephone Encounter (Signed)
RETURNED PATIENT'S PHONE CALL, LVM FOR A RETURN CALL 

## 2012-06-24 ENCOUNTER — Ambulatory Visit: Payer: Medicare Other

## 2012-06-24 ENCOUNTER — Encounter: Payer: Self-pay | Admitting: Radiation Oncology

## 2012-06-24 ENCOUNTER — Ambulatory Visit: Payer: Medicare Other | Admitting: Radiation Oncology

## 2012-06-27 ENCOUNTER — Encounter: Payer: Self-pay | Admitting: Radiation Oncology

## 2012-06-27 ENCOUNTER — Ambulatory Visit
Admission: RE | Admit: 2012-06-27 | Discharge: 2012-06-27 | Disposition: A | Discharge status: Home / Self Care | Payer: Medicare Other | Source: Ambulatory Visit | Attending: Radiation Oncology | Admitting: Radiation Oncology

## 2012-06-27 VITALS — BP 125/86 | HR 77 | Temp 97.9°F | Resp 20 | Ht 69.0 in | Wt 203.3 lb

## 2012-06-27 DIAGNOSIS — F411 Generalized anxiety disorder: Secondary | ICD-10-CM | POA: Diagnosis not present

## 2012-06-27 DIAGNOSIS — C3410 Malignant neoplasm of upper lobe, unspecified bronchus or lung: Secondary | ICD-10-CM | POA: Diagnosis not present

## 2012-06-27 DIAGNOSIS — C3490 Malignant neoplasm of unspecified part of unspecified bronchus or lung: Secondary | ICD-10-CM

## 2012-06-27 DIAGNOSIS — M810 Age-related osteoporosis without current pathological fracture: Secondary | ICD-10-CM | POA: Diagnosis not present

## 2012-06-27 DIAGNOSIS — C7931 Secondary malignant neoplasm of brain: Secondary | ICD-10-CM | POA: Insufficient documentation

## 2012-06-27 DIAGNOSIS — C7949 Secondary malignant neoplasm of other parts of nervous system: Secondary | ICD-10-CM | POA: Insufficient documentation

## 2012-06-27 DIAGNOSIS — Z79899 Other long term (current) drug therapy: Secondary | ICD-10-CM | POA: Insufficient documentation

## 2012-06-27 DIAGNOSIS — C779 Secondary and unspecified malignant neoplasm of lymph node, unspecified: Secondary | ICD-10-CM | POA: Insufficient documentation

## 2012-06-27 DIAGNOSIS — C50919 Malignant neoplasm of unspecified site of unspecified female breast: Secondary | ICD-10-CM | POA: Diagnosis not present

## 2012-06-27 DIAGNOSIS — C719 Malignant neoplasm of brain, unspecified: Secondary | ICD-10-CM

## 2012-06-27 HISTORY — DX: Malignant neoplasm of thyroid gland: C73

## 2012-06-27 HISTORY — DX: Anxiety disorder, unspecified: F41.9

## 2012-06-27 HISTORY — DX: Solitary pulmonary nodule: R91.1

## 2012-06-27 HISTORY — DX: Unspecified cataract: H26.9

## 2012-06-27 HISTORY — DX: Personal history of irradiation: Z92.3

## 2012-06-27 HISTORY — DX: Long term (current) use of other immunomodulators and immunosuppressants: Z79.69

## 2012-06-27 HISTORY — DX: Other long term (current) drug therapy: Z79.899

## 2012-06-27 HISTORY — DX: Personal history of antineoplastic chemotherapy: Z92.21

## 2012-06-27 NOTE — Progress Notes (Signed)
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 dx initally 2002,  Radiation therapy  4000cGy top the brain  10/08/2005-11/07/2005 with  Dr.Wu Radiation therapy 1left lower lung 06/26/01-08/15/01 Dr Dorna Bloom Now Right upper lobe lung 8mm increased from 7mm To discuss radiation therapy possible gamma knife, patient alert,oriented x3, no pain, no c/o shortness breath, does have had bloody nose past 2 days after blowing nose, on left nare, no clots seen, < teaspoon, patient taking Tarceva daily , appetite good, energy level up and down stated, has 14 steps to get in her house and she then gets short of breath and has to lay down and rest  Allergies: NKDA

## 2012-06-27 NOTE — Progress Notes (Signed)
Please see the Nurse Progress Note in the MD Initial Consult Encounter for this patient. 

## 2012-06-27 NOTE — Progress Notes (Signed)
Radiation Oncology         ([phone]) [phone] ________________________________  Initial outpatient Consultation  Name: Valerie [name] MRN: [id number]  Date: 06/27/2012  DOB: 07/31/1946  WU:[id number],NWGNF N, MD  Si Gaul, MD   REFERRING PHYSICIAN: Si Gaul, MD  DIAGNOSIS: Metastatic lung adenocarcinoma, right upper lobe nodule  HISTORY OF PRESENT ILLNESS::Valerie [name] is a 66 y.o. female who was diagnosed initially with stage  IIB adenocarcinoma in October 2002.  She underwent left lower lobectomy under the care of Dr. Edwyna Shell in October 2002. She then underwent concurrent chemoradiation with weekly carboplatin and paclitaxel under the care of Dr Truett Perna and Dr Dorna Bloom - she received a total dose of 61.2 gray to the chest .  She then received  treatment with Celebrex and Iressa according to the clinical trial at [facility] for a total of 1 year. Subsequently she underwent left occipital craniotomy for tumor resection on September 21, 2005, under the care of Dr Franky Macho. She received whole brain irradiation under the care of Dr Dorna Bloom, completed November 07, 2005, total dose 36 gray to the whole brain followed by 4 gray boost.  She is status post gamma knife treatment to a resected cavitary recurrence on June 02, 2008, at [facility] She underwent right thyroidectomy with radical neck dissection under the care of Dr. Pollyann Kennedy on 10/18/2011. The thyroid gland revealed an adenoma, follicular. One of 6 lymph nodes from the right neck dissection revealed metastatic adenocarcinoma consistent with lung primary. She is currently receiving Tarceva 150 mg by mouth daily with therapy beginning 06/03/2007.   She's been followed with serial CT scans. Most recently, she has been found to have a right upper lobe central lung nodule. This looks mildly progressive: It is currently 8 mm in greatest axial dimension. Her scan in July demonstrates the nodule to be about 7 mm in greatest  dimension.  Her scan from April demonstrates the same nodule at approximately 6 mm in greatest dimension. I went over the patient's scans with her, demonstrating the very slow interval growth.  She has at least 3 additional pulmonary nodules, none more than 5 mm in size, all of which appear essentially stable. She is asymptomatic from these nodules. She is active in [employer]. She does tire easily.  PREVIOUS RADIATION THERAPY: Yes - Whole Brain (36Gy with 4 Gy boost  In 10/2005); Gamma Knife to Brain; Left Lung (07/2001) to 61.2Gy  PAST MEDICAL HISTORY:  has a past medical history of Heart murmur; Shortness of breath; Arthritis; Lung cancer (dx'd 2002); Metastasis to brain (dx'd 2008); Metastasis to lymph nodes (dx'd 09/2011); Anal fistula; H/O osteoporosis; H/O vitamin D deficiency; History of measles, mumps, or rubella; H/O varicella; Hypertension; Yeast infection; Abnormal Pap smear (2006); Atrophic vaginitis (2008); Dyspareunia (2008); Dementia (2009); Osteoporosis (2010); Status post chemotherapy (2003); Status post radiation therapy (2003); Status post radiation therapy (11/07/2005); Status post radiation therapy (06/02/2008); On antineoplastic chemotherapy; Nodule of right lung (CT- 06/03/12); Thyroid adenoma; Thyroid cancer (10/18/11 bx); Anxiety; and Cataract.    PAST SURGICAL HISTORY: Past Surgical History  Procedure Date  . Left occipital craniotomy 09/21/2005    tumor  . Left lower lobectomy 05/2001  . Radical neck dissection 10/18/2011    Procedure: RADICAL NECK DISSECTION;  Surgeon: Serena Colonel, MD;  Location: [facility] OR;  Service: ENT;  Laterality: Right;  RIGHT MODIFIED NECK DISSECTION /POSSIBLE RIGHT THYROIDECTOM  . Thyroidectomy 10/18/2011    Procedure: THYROIDECTOMY WITH RADICAL NECK DISSECTION;  Surgeon: Enrigue Catena  Pollyann Kennedy, MD;  Location: [facility] OR;  Service: ENT;  Laterality: Right;    FAMILY HISTORY: family history includes Cancer in her father and mother.  SOCIAL HISTORY:   reports that she has never smoked. She has never used smokeless tobacco. She reports that she does not drink alcohol or use illicit drugs.  ALLERGIES: Review of patient's allergies indicates no known allergies.  MEDICATIONS:  Current Outpatient Prescriptions  Medication Sig Dispense Refill  . alendronate (FOSAMAX) 70 MG tablet Take 1 tablet (70 mg total) by mouth every 7 (seven) days. Saturdays; Take with a full glass of water on an empty stomach.  4 tablet  12  . Ascorbic Acid (VITAMIN C PO) Take 1 tablet by mouth daily. Takes twice a wek      . aspirin EC 81 MG tablet Take 81 mg by mouth every other day.       . Cholecalciferol (VITAMIN D3) 2000 UNITS TABS Take 2,000 Int'l Units by mouth every other day.       . donepezil (ARICEPT) 10 MG tablet Take 10 mg by mouth at bedtime.       . erlotinib (TARCEVA) 150 MG tablet Take 1 tablet (150 mg total) by mouth daily. Take on an empty stomach 1 hour before meals or 2 hours after.  30 tablet  2  . Multiple Vitamin (MULITIVITAMIN WITH MINERALS) TABS Take 1 tablet by mouth daily.      . naphazoline-pheniramine (NAPHCON-A) 0.025-0.3 % ophthalmic solution Place 1 drop into both eyes 4 (four) times daily as needed. For dry eyes        REVIEW OF SYSTEMS:  A complete review of systems was obtained. Notable for that above. Also notable for memory loss, easy fatiguing with exertion.  She denies any escalation of shortness of breath. She denies lower extremity edema. She denies headaches nausea visual changes or neurologic changes.  PHYSICAL EXAM:  height is 5\' 9"  (1.753 m) and weight is 203 lb 4.8 oz (92.216 kg). Her temperature is 97.9 F (36.6 C). Her blood pressure is 125/86 and her pulse is 77. Her respiration is 20 and oxygen saturation is 94%.   General: Alert and oriented, in no acute distress HEENT: Head is normocephalic. Pupils are equally round. Extraocular movements are intact. Oropharynx is clear. Neck: Neck is with postsurgical change, no  palpable cervical or supraclavicular lymphadenopathy. Heart: Regular in rate and rhythm with no murmurs, rubs, or gallops. Chest: Clear to auscultation bilaterally, with no rhonchi, wheezes, or rales. Abdomen: Soft, nontender, nondistended, with no rigidity or guarding. Extremities: No cyanosis or edema. Lymphatics: No concerning lymphadenopathy. Skin: No concerning lesions. Musculoskeletal: symmetric strength and muscle tone throughout. Neurologic: Cranial nerves II through XII are grossly intact. No obvious focalities. Speech is fluent. Coordination is intact. Psychiatric: Judgment and insight are intact. Affect is appropriate.    LABORATORY DATA:  Lab Results  Component Value Date   WBC 4.0 06/03/2012   HGB 13.1 06/03/2012   HCT 38.6 06/03/2012   MCV 92.0 06/03/2012   PLT 211 06/03/2012   CMP     Component Value Date/Time   NA 139 06/03/2012 0836   NA 138 02/28/2012 0939   NA 140 01/23/2012 0941   K 3.5 06/03/2012 0836   K 4.6 02/28/2012 0939   K 3.6 01/23/2012 0941   CL 104 06/03/2012 0836   CL 99 02/28/2012 0939   CL 105 01/23/2012 0941   CO2 25 06/03/2012 0836   CO2 28 02/28/2012 0939  CO2 26 01/23/2012 0941   GLUCOSE 97 06/03/2012 0836   GLUCOSE 104 02/28/2012 0939   GLUCOSE 81 01/23/2012 0941   BUN 10.0 06/03/2012 0836   BUN 12 02/28/2012 0939   BUN 11 01/23/2012 0941   CREATININE 0.9 06/03/2012 0836   CREATININE 1.0 02/28/2012 0939   CREATININE 0.83 01/23/2012 0941   CALCIUM 9.8 06/03/2012 0836   CALCIUM 9.1 02/28/2012 0939   CALCIUM 9.5 01/23/2012 0941   PROT 6.5 06/03/2012 0836   PROT 6.9 02/28/2012 0939   PROT 6.4 01/23/2012 0941   ALBUMIN 3.9 06/03/2012 0836   ALBUMIN 4.4 01/23/2012 0941   AST 14 06/03/2012 0836   AST 20 02/28/2012 0939   AST 17 01/23/2012 0941   ALT 17 06/03/2012 0836   ALT 17 01/23/2012 0941   ALKPHOS 86 06/03/2012 0836   ALKPHOS 80 02/28/2012 0939   ALKPHOS 72 01/23/2012 0941   BILITOT 1.60* 06/03/2012 0836   BILITOT 1.30 02/28/2012 0939   BILITOT 1.3*  01/23/2012 0941   GFRNONAA 74* 10/16/2011 1045   GFRAA 86* 10/16/2011 1045         RADIOGRAPHY: Ct Soft Tissue Neck W Contrast  06/03/2012  *RADIOLOGY REPORT*  Clinical Data: Metastatic lung cancer with right supraclavicular lymph node removal.  Previous right thyroidectomy.  CT NECK WITH CONTRAST  Technique:  Multidetector CT imaging of the neck was performed with intravenous contrast.  Contrast: OMNIPAQUE IOHEXOL 300 MG/ML  SOLN  Comparison: 02/28/2012.  12/10/2011.  Findings: Slight interval regression 11 x 5 mm left internal jugular noted (image 58 series 4). Loss of fat planes on the right related to right thyroidectomy and right supraclavicular node resection from metastatic lung cancer. Right thyroidectomy with stable appearing left thyroid gland containing a few tiny cysts. Airway midline.  Fatty replaced right parotid gland relates to previous XRT.  No pathologic adenopathy.  No neck masses or mucosal lesions. Slight asymmetry base of tongue  without definitive mass lesion.  Carotid arteries widely patent with only mild nonstenotic posterior wall calcific plaque bilaterally.  No worrisome osseous lesions.  Right maxillary sinus opacity likely radicular cyst related to right maxillary molar roots.  IMPRESSION: Stable CT neck.  No pathologic lymphadenopathy based on short axis measurements.   Original Report Authenticated By: Elsie Stain, M.D.    Ct Chest W Contrast  06/03/2012  *RADIOLOGY REPORT*  Clinical Data:  Metastatic lung cancer, tarceva ongoing, XRT complete  CT CHEST, ABDOMEN AND PELVIS WITH CONTRAST  Technique:  Multidetector CT imaging of the chest, abdomen and pelvis was performed following the standard protocol during bolus administration of intravenous contrast.  Contrast: OMNIPAQUE IOHEXOL 300 MG/ML  SOLN  Comparison:  02/28/2012  CT CHEST  Findings:  Status post left lower lobectomy.  Additional left paramediastinal radiation changes.  8 mm central right upper lobe  pulmonary nodule (series 7/image 21), slowly growing across prior studies, suspicious.  Additional scattered bilateral pulmonary nodules measuring up to 5 mm (series 7/image 33), unchanged.  Trace left pleural effusion. No pneumothorax.  Status post right thyroidectomy.  Visualized left thyroid is mildly nodular.  The heart is top normal in size.  No pericardial effusion.  No suspicious mediastinal, hilar, or axillary lymphadenopathy.  Mild degenerative changes of the thoracic spine.  IMPRESSION: 8 mm central right upper lobe pulmonary nodule, slowly growing across prior studies, suspicious.  Continued attention on follow-up is suggested.  Status post left lower lobectomy with left paramediastinal radiation changes.  CT ABDOMEN AND PELVIS  Findings:  Tiny hypoenhancing lesion in the lateral segment left hepatic lobe (series 2/image 55), likely benign.  Spleen, pancreas, and adrenal glands are within normal limits.  Gallbladder is unremarkable.  No intrahepatic or extrahepatic ductal dilatation.  Kidneys are within normal limits.  No hydronephrosis.  No evidence of bowel obstruction.  Normal appendix.  Colon is decompressed.  No evidence of abdominal aortic aneurysm.  No abdominopelvic ascites.  No suspicious abdominopelvic lymphadenopathy.  Suspected posterior fundal uterine fibroid (series 2/image 115). Bilateral ovaries are unremarkable.  Bladder is within normal limits.  Visualized osseous structures are within normal limits.  IMPRESSION: No evidence of metastatic disease in the abdomen/pelvis.   Original Report Authenticated By: Charline Bills, M.D.    Ct Abdomen Pelvis W Contrast  06/03/2012  *RADIOLOGY REPORT*  Clinical Data:  Metastatic lung cancer, tarceva ongoing, XRT complete  CT CHEST, ABDOMEN AND PELVIS WITH CONTRAST  Technique:  Multidetector CT imaging of the chest, abdomen and pelvis was performed following the standard protocol during bolus administration of intravenous contrast.  Contrast:  OMNIPAQUE IOHEXOL 300 MG/ML  SOLN  Comparison:  02/28/2012  CT CHEST  Findings:  Status post left lower lobectomy.  Additional left paramediastinal radiation changes.  8 mm central right upper lobe pulmonary nodule (series 7/image 21), slowly growing across prior studies, suspicious.  Additional scattered bilateral pulmonary nodules measuring up to 5 mm (series 7/image 33), unchanged.  Trace left pleural effusion. No pneumothorax.  Status post right thyroidectomy.  Visualized left thyroid is mildly nodular.  The heart is top normal in size.  No pericardial effusion.  No suspicious mediastinal, hilar, or axillary lymphadenopathy.  Mild degenerative changes of the thoracic spine.  IMPRESSION: 8 mm central right upper lobe pulmonary nodule, slowly growing across prior studies, suspicious.  Continued attention on follow-up is suggested.  Status post left lower lobectomy with left paramediastinal radiation changes.  CT ABDOMEN AND PELVIS  Findings:  Tiny hypoenhancing lesion in the lateral segment left hepatic lobe (series 2/image 55), likely benign.  Spleen, pancreas, and adrenal glands are within normal limits.  Gallbladder is unremarkable.  No intrahepatic or extrahepatic ductal dilatation.  Kidneys are within normal limits.  No hydronephrosis.  No evidence of bowel obstruction.  Normal appendix.  Colon is decompressed.  No evidence of abdominal aortic aneurysm.  No abdominopelvic ascites.  No suspicious abdominopelvic lymphadenopathy.  Suspected posterior fundal uterine fibroid (series 2/image 115). Bilateral ovaries are unremarkable.  Bladder is within normal limits.  Visualized osseous structures are within normal limits.  IMPRESSION: No evidence of metastatic disease in the abdomen/pelvis.   Original Report Authenticated By: Charline Bills, M.D.       IMPRESSION/PLAN: Very pleasant 66 year old woman with a mildly/slowly progressive subcentimeter right upper lobe lung nodule in the setting of metastatic  adenocarcinoma of the lung. She is asymptomatic. She has continued on Tarceva for the past 5 years.  I went over the patient's scans with her in detail. She is due for more CT imaging of her head neck chest abdomen and pelvis in January. I explained to her that while it is not unreasonable to eventually treat the nodule, there are some risks as this appears to be on the margin of her prior chest radiotherapy fields. Therefore, this would require some reirradiation of tissue. I think it is prudent for [place] to see if there is any further progression in her January CT scans. If this continues to progress, we can consider conformal, targeted radiotherapy to that site.  If  another nodule also shows significant progression, I may consider treating that, as well, simultaneously.  Will schedule followup in the second half of January, for her to review her scan results with me.  She is pleased with this plan.  Of note, due to easy fatiguing and history of partial thyroidectomy, she probably should undergo a TSH test. It appears this was ordered in March 2013 but I don't see any results. I'm not sure if the order is going to be executed in the near future. I will ask medical oncology about this. __________________________________________   Lonie Peak, MD

## 2012-07-10 ENCOUNTER — Encounter: Payer: Self-pay | Admitting: *Deleted

## 2012-07-10 NOTE — Progress Notes (Signed)
RECEIVED A FAX FROM [facility] CONCERNING A CONFIRMATION OF PRESCRIPTION SHIPMENT FOR TARCEVA ON 07/09/12.

## 2012-08-12 ENCOUNTER — Encounter: Payer: Self-pay | Admitting: *Deleted

## 2012-08-12 NOTE — Progress Notes (Signed)
RECEIVED A FAX FROM [facility] CONCERNING A CONFIRMATION OF PRESCRIPTION SHIPMENT FOR TARCEVA ON 08/08/12.

## 2012-09-04 ENCOUNTER — Ambulatory Visit (HOSPITAL_COMMUNITY): Payer: Medicare Other

## 2012-09-04 ENCOUNTER — Other Ambulatory Visit: Payer: Self-pay | Admitting: Internal Medicine

## 2012-09-04 ENCOUNTER — Encounter (HOSPITAL_COMMUNITY): Payer: Self-pay

## 2012-09-04 ENCOUNTER — Encounter: Payer: Self-pay | Admitting: Radiation Oncology

## 2012-09-04 ENCOUNTER — Ambulatory Visit (HOSPITAL_COMMUNITY)
Admission: RE | Admit: 2012-09-04 | Discharge: 2012-09-04 | Disposition: A | Discharge status: Home / Self Care | Payer: Medicare Other | Source: Ambulatory Visit | Attending: Internal Medicine | Admitting: Internal Medicine

## 2012-09-04 ENCOUNTER — Other Ambulatory Visit (HOSPITAL_BASED_OUTPATIENT_CLINIC_OR_DEPARTMENT_OTHER): Payer: Medicare Other | Admitting: Lab

## 2012-09-04 DIAGNOSIS — C3430 Malignant neoplasm of lower lobe, unspecified bronchus or lung: Secondary | ICD-10-CM | POA: Diagnosis not present

## 2012-09-04 DIAGNOSIS — C7931 Secondary malignant neoplasm of brain: Secondary | ICD-10-CM | POA: Diagnosis not present

## 2012-09-04 DIAGNOSIS — C3490 Malignant neoplasm of unspecified part of unspecified bronchus or lung: Secondary | ICD-10-CM

## 2012-09-04 DIAGNOSIS — C7949 Secondary malignant neoplasm of other parts of nervous system: Secondary | ICD-10-CM | POA: Insufficient documentation

## 2012-09-04 DIAGNOSIS — R911 Solitary pulmonary nodule: Secondary | ICD-10-CM | POA: Diagnosis not present

## 2012-09-04 LAB — COMPREHENSIVE METABOLIC PANEL (CC13)
ALT: 14 U/L (ref 0–55)
AST: 13 U/L (ref 5–34)
Albumin: 3.7 g/dL (ref 3.5–5.0)
Alkaline Phosphatase: 81 U/L (ref 40–150)
BUN: 11 mg/dL (ref 7.0–26.0)
CO2: 27 mEq/L (ref 22–29)
Calcium: 9.4 mg/dL (ref 8.4–10.4)
Chloride: 105 mEq/L (ref 98–107)
Creatinine: 0.9 mg/dL (ref 0.6–1.1)
Glucose: 94 mg/dl (ref 70–99)
Potassium: 3.8 mEq/L (ref 3.5–5.1)
Sodium: 141 mEq/L (ref 136–145)
Total Bilirubin: 1.16 mg/dL (ref 0.20–1.20)
Total Protein: 7 g/dL (ref 6.4–8.3)

## 2012-09-04 LAB — CBC WITH DIFFERENTIAL/PLATELET
BASO%: 0.5 % (ref 0.0–2.0)
Basophils Absolute: 0 10*3/uL (ref 0.0–0.1)
EOS%: 4 % (ref 0.0–7.0)
Eosinophils Absolute: 0.2 10*3/uL (ref 0.0–0.5)
HCT: 40.1 % (ref 34.8–46.6)
HGB: 13.3 g/dL (ref 11.6–15.9)
LYMPH%: 18 % (ref 14.0–49.7)
MCH: 30.2 pg (ref 25.1–34.0)
MCHC: 33.2 g/dL (ref 31.5–36.0)
MCV: 90.9 fL (ref 79.5–101.0)
MONO#: 0.3 10*3/uL (ref 0.1–0.9)
MONO%: 7 % (ref 0.0–14.0)
NEUT#: 2.9 10*3/uL (ref 1.5–6.5)
NEUT%: 70.5 % (ref 38.4–76.8)
Platelets: 204 10*3/uL (ref 145–400)
RBC: 4.41 10*6/uL (ref 3.70–5.45)
RDW: 13.9 % (ref 11.2–14.5)
WBC: 4.1 10*3/uL (ref 3.9–10.3)
lymph#: 0.7 10*3/uL — ABNORMAL LOW (ref 0.9–3.3)

## 2012-09-04 MED ORDER — IOHEXOL 300 MG/ML  SOLN
125.0000 mL | Freq: Once | INTRAMUSCULAR | Status: AC | PRN
  Administered 2012-09-04: 125 mL via INTRAVENOUS

## 2012-09-04 NOTE — Progress Notes (Signed)
Enlarging lung nodule right upper lobe as evidenced by her current CT of the Chest on 09/04/12.  To discuss treatment options with Dr. Basilio Cairo

## 2012-09-05 ENCOUNTER — Ambulatory Visit
Admission: RE | Admit: 2012-09-05 | Discharge: 2012-09-05 | Payer: Medicare Other | Source: Ambulatory Visit | Attending: Radiation Oncology | Admitting: Radiation Oncology

## 2012-09-08 ENCOUNTER — Ambulatory Visit (HOSPITAL_BASED_OUTPATIENT_CLINIC_OR_DEPARTMENT_OTHER): Payer: Medicare Other | Admitting: Internal Medicine

## 2012-09-08 ENCOUNTER — Telehealth: Payer: Self-pay | Admitting: Internal Medicine

## 2012-09-08 ENCOUNTER — Encounter: Payer: Self-pay | Admitting: Internal Medicine

## 2012-09-08 VITALS — BP 122/78 | HR 106 | Temp 98.3°F | Resp 18 | Ht 69.0 in | Wt 202.9 lb

## 2012-09-08 DIAGNOSIS — C3430 Malignant neoplasm of lower lobe, unspecified bronchus or lung: Secondary | ICD-10-CM | POA: Diagnosis not present

## 2012-09-08 DIAGNOSIS — C3490 Malignant neoplasm of unspecified part of unspecified bronchus or lung: Secondary | ICD-10-CM

## 2012-09-08 DIAGNOSIS — C7949 Secondary malignant neoplasm of other parts of nervous system: Secondary | ICD-10-CM

## 2012-09-08 DIAGNOSIS — C7931 Secondary malignant neoplasm of brain: Secondary | ICD-10-CM | POA: Diagnosis not present

## 2012-09-08 NOTE — Telephone Encounter (Signed)
Central scheduling called and needed to r/s lab.Marland KitchenMarland KitchenMarland KitchenDone...the patient has already been notified

## 2012-09-08 NOTE — Telephone Encounter (Signed)
gv and printed appt schedule for pt for April....the patient aware

## 2012-09-08 NOTE — Progress Notes (Signed)
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   PRIOR THERAPY:  1. Status post left lower lobectomy under the care of Dr Edwyna Shell in October 2002. 2. Status post course of concurrent chemoradiation with weekly carboplatin and paclitaxel under the care of Dr Truett Perna and Dr Dorna Bloom in early 2003. 3. Status post treatment with Celebrex and Iressa according to the clinical trial at [name] for a total of 1 year. 4. Status post left occipital craniotomy for tumor resection on September 21, 2005, under the care of Dr Franky Macho. 5. Status post whole brain irradiation under the care of Dr Dorna Bloom, completed November 07, 2005. 6. Status post gamma knife treatment to the resected cavitary recurrence on June 02, 2008, at [facility]. 7. Status post right thyroidectomy with radical neck dissection under the care of Dr. Pollyann Kennedy on 10/18/2011.  CURRENT THERAPY: Tarceva 150 mg by mouth daily with therapy beginning 06/03/2007.  INTERVAL HISTORY: Valerie [name] 67 y.o. female returns to the clinic today for routine three-month followup visit. The patient is feeling fine today with no specific complaints. She denied having any significant chest pain, shortness breath, cough or hemoptysis. She has no weight loss or night sweats. She continues to have mild pain in the right supraclavicular area secondary to her previous lymphadenopathy and radiotherapy to that area. The patient had repeat CT scan of the neck, chest, abdomen and pelvis performed recently and she is here for evaluation and discussion of her scan results.  MEDICAL HISTORY: Past Medical History  Diagnosis Date  . Heart murmur   . Shortness of breath     hx lung ca   .  Arthritis   . Lung cancer dx'd 2002  . Metastasis to brain dx'd 2008  . 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 Anal fistula   . H/O osteoporosis   . H/O vitamin D deficiency   . History of measles, mumps, or rubella   . H/O varicella   . Hypertension   . Yeast infection   . Abnormal Pap smear 2006  . Atrophic vaginitis 2008  . Dyspareunia 2008  . Dementia 2009  . Osteoporosis 2010  . Status post chemotherapy 2003    CARBOPLATIN/PACLITAXEL /STATUS POST CLINICAL TRAIL OF CELEBREX AND IRESSA AT [facility] FOR 1 YEAR  . Status post radiation therapy 2003    LEFT LUNG  . Status post radiation therapy 11/07/2005    WHOLE BRAIN: DR Jill Alexanders [facility]  . Status post radiation therapy 06/02/2008    GAMMA KNIFE OF RESECTED CAVITAY  . On antineoplastic chemotherapy     TARCEVA  . Nodule of right lung CT- 06/03/12    RIGHT UPPER LOBE  . Thyroid adenoma     ?  [name] Thyroid cancer 10/18/11 bx    adenoid nodules   . Anxiety   . Cataract   . Nodule of right lung 06/03/12    Upper Lobe    ALLERGIES:   has no known allergies.  MEDICATIONS:  Current Outpatient Prescriptions  Medication Sig Dispense Refill  . alendronate (FOSAMAX) 70 MG tablet Take 1 tablet (70 mg total) by mouth every 7 (seven) days. Saturdays; Take with a full glass of water on an empty stomach.  4 tablet  12  .  Ascorbic Acid (VITAMIN C PO) Take 1 tablet by mouth daily. Takes twice a wek      . aspirin EC 81 MG tablet Take 81 mg by mouth every other day.       . Cholecalciferol (VITAMIN D3) 2000 UNITS TABS Take 2,000 Int'l Units by mouth every other day.       . donepezil (ARICEPT) 10 MG tablet Take 10 mg by mouth at bedtime.       . erlotinib (TARCEVA) 150 MG tablet Take 1 tablet (150 mg total) by mouth daily. Take on an empty stomach 1 hour before meals or 2 hours after.  30 tablet  2  . Multiple Vitamin (MULITIVITAMIN WITH MINERALS) TABS Take 1 tablet by mouth daily.      . naphazoline-pheniramine (NAPHCON-A) 0.025-0.3 %  ophthalmic solution Place 1 drop into both eyes 4 (four) times daily as needed. For dry eyes        SURGICAL HISTORY:  Past Surgical History  Procedure Date  . Left occipital craniotomy 09/21/2005    tumor  . Left lower lobectomy 05/2001  . Radical neck dissection 10/18/2011    Procedure: RADICAL NECK DISSECTION;  Surgeon: Serena Colonel, MD;  Location: [name] OR;  Service: ENT;  Laterality: Right;  RIGHT MODIFIED NECK DISSECTION /POSSIBLE RIGHT THYROIDECTOM  . Thyroidectomy 10/18/2011    Procedure: THYROIDECTOMY WITH RADICAL NECK DISSECTION;  Surgeon: Serena Colonel, MD;  Location: [facility] OR;  Service: ENT;  Laterality: Right;    REVIEW OF SYSTEMS:  A comprehensive review of systems was negative.   PHYSICAL EXAMINATION: General appearance: alert, cooperative and no distress Head: Normocephalic, without obvious abnormality, atraumatic Neck: no adenopathy Lymph nodes: Cervical, supraclavicular, and axillary nodes normal. Resp: clear to auscultation bilaterally Cardio: regular rate and rhythm, S1, S2 normal, no murmur, click, rub or gallop GI: soft, non-tender; bowel sounds normal; no masses,  no organomegaly Extremities: extremities normal, atraumatic, no cyanosis or edema Neurologic: Alert and oriented X 3, normal strength and tone. Normal symmetric reflexes. Normal coordination and gait  ECOG PERFORMANCE STATUS: 1 - Symptomatic but completely ambulatory  Blood pressure 122/78, pulse 106, temperature 98.3 F (36.8 C), temperature source Oral, resp. rate 18, height 5\' 9"  (1.753 m), weight 202 lb 14.4 oz (92.035 kg).  LABORATORY DATA: Lab Results  Component Value Date   WBC 4.1 09/04/2012   HGB 13.3 09/04/2012   HCT 40.1 09/04/2012   MCV 90.9 09/04/2012   PLT 204 09/04/2012      Chemistry      Component Value Date/Time   NA 141 09/04/2012 0954   NA 138 02/28/2012 0939   NA 140 01/23/2012 0941   K 3.8 09/04/2012 0954   K 4.6 02/28/2012 0939   K 3.6 01/23/2012 0941   CL 105 09/04/2012 0954   CL 99  02/28/2012 0939   CL 105 01/23/2012 0941   CO2 27 09/04/2012 0954   CO2 28 02/28/2012 0939   CO2 26 01/23/2012 0941   BUN 11.0 09/04/2012 0954   BUN 12 02/28/2012 0939   BUN 11 01/23/2012 0941   CREATININE 0.9 09/04/2012 0954   CREATININE 1.0 02/28/2012 0939   CREATININE 0.83 01/23/2012 0941      Component Value Date/Time   CALCIUM 9.4 09/04/2012 0954   CALCIUM 9.1 02/28/2012 0939   CALCIUM 9.5 01/23/2012 0941   ALKPHOS 81 09/04/2012 0954   ALKPHOS 80 02/28/2012 0939   ALKPHOS 72 01/23/2012 0941   AST 13 09/04/2012 0954   AST 20 02/28/2012  0939   AST 17 01/23/2012 0941   ALT 14 09/04/2012 0954   ALT 17 01/23/2012 0941   BILITOT 1.16 09/04/2012 0954   BILITOT 1.30 02/28/2012 0939   BILITOT 1.3* 01/23/2012 0941       RADIOGRAPHIC STUDIES: Ct Head W Wo Contrast  09/04/2012  *RADIOLOGY REPORT*  Clinical Data: Lung cancer.  Gamma knife treatment.  No head complaints.  CT HEAD WITHOUT AND WITH CONTRAST  Technique:  Contiguous axial images were obtained from the base of the skull through the vertex without and with intravenous contrast.  Contrast: OMNIPAQUE IOHEXOL 300 MG/ML  SOLN  Comparison: Most recent 12/10/2011.  Findings: Redemonstrated is mild cerebral and cerebellar atrophy. Moderately extensive white matter hypodensity likely reflects post treatment effect.  No acute stroke or bleed. No hydrocephalus or extra-axial fluid.  Carotid atherosclerosis.  Remote left posterior temporal occipital craniotomy.  Underlying partial calcified lesion with slight encephalomalacia, along with minimal associated post contrast enhancement, has not increased over the 41-month interval (image 14, series 2 and series 11).  No other foci of abnormal intracranial enhancement or visible intracranial mass lesion.  No osseous destructive lesion.  Clear sinuses and mastoids.  IMPRESSION: Stable postsurgical and post radiotherapy intracranial changes. There is chronic calcification and slight enhancement associated with an old left  posterior temporo-occipital metastasis.   Original Report Authenticated By: Davonna Belling, M.D.    Ct Soft Tissue Neck W Contrast  09/04/2012  *RADIOLOGY REPORT*  Clinical Data: Lung cancer, staging  CT NECK WITH CONTRAST  Technique:  Multidetector CT imaging of the neck was performed with intravenous contrast.  Contrast: OMNIPAQUE IOHEXOL 300 MG/ML  SOLN  Comparison: Multiple priors, most recent 06/03/2012.  Findings: Prior right thyroidectomy.  Prior right neck dissection. Postoperative changes with loss of fat planes.  Left supraclavicular node continues to show regression, now 4 x 10 mm. Fatty replaced right parotid.  No mucosal lesion.  No pathologic adenopathy.  Vascular calcification.  No worrisome osseous lesions. No significant spondylosis. Radicular cyst suspected right maxillary sinus.  IMPRESSION: Stable exam.  No evidence for recurrent tumor in the neck or pathologic adenopathy.   Original Report Authenticated By: Davonna Belling, M.D.    Ct Chest W Contrast  09/04/2012  *RADIOLOGY REPORT*  Clinical Data:  67 year old female with initial stage IIb lung adenocarcinoma.  The patient developed the brain metastasis in 2007 and underwent tumor resection and radiation.  The patient subsequently developed metastatic adenopathy in the right neck and local recurrence in the right lung.  Potential enlarging right lung nodule.  CT CHEST, ABDOMEN AND PELVIS WITH CONTRAST  Technique:  Multidetector CT imaging of the chest, abdomen and pelvis was performed following the standard protocol during bolus administration of intravenous contrast.  Contrast: OMNIPAQUE IOHEXOL 300 MG/ML  SOLN  Comparison:  CT 10/15 1013, 02/27/2029  CT CHEST  Findings:  Within the right upper lobe, the central pulmonary nodule of concern measures 10 mm x 9 mm increased from 8 mm x 8 mm on most recent CT comparison from 06/03/2012 and from 7 mm x 7 mm on CT of 02/28/2012.  More peripheral small nodule in the right upper lobe is  stable measuring 5 mm (image 20) compared to 4 mm on prior.  There is postsurgical change in the left lung.  Lesion in the left upper lobe measures 3 mm compared to 3 mm on prior (image 17). Within the left lower lobe there is linear bronchiectasis and pleural thickening which is  stable.  There is no axillary or supraclavicular lymphadenopathy.  No mediastinal or hilar lymphadenopathy.  No pericardial fluid.  There is small amount of fluid in the esophagus which does not demonstrate wall thickening.  IMPRESSION:  1.  Slow interval growth of right upper lobe pulmonary nodule now measuring up to 10 mm is concerning for neoplasm. 2.  Stable smaller sub 5 mm pulmonary nodules. 3.  Stable postoperative change in the left lower lobe.   CT ABDOMEN AND PELVIS  Findings:  Tiny hypodensity in  the left hepatic lobe is unchanged. The gallbladder, pancreas, spleen, adrenal glands, and kidneys are normal.  The stomach, small bowel, cecum, appendix are normal.  The colon and rectosigmoid colon are normal.  Abdominal aorta is normal caliber.  No retroperitoneal periportal lymphadenopathy.  Within the pelvis, the uterus and bladder are normal.  No pelvic lymphadenopathy. Review of  bone windows demonstrates no aggressive osseous lesions.  IMPRESSION:  1.  No evidence of metastasis within the abdomen or pelvis.   Original Report Authenticated By: Genevive Bi, M.D.     ASSESSMENT: This is a very pleasant 67 years old African American female with metastatic non-small cell lung cancer currently on treatment with Tarceva since October of 2008 with no evidence for disease progression except for small nodule in the right upper lobe that increased in size from the previous scan.  PLAN: I discussed the scan results with the patient today and showed them the images. I recommended for her to continue treatment was Tarceva with the current dose for now. I would see her back for followup visit in 3 months with repeat CT scan of the  neck, chest, abdomen and pelvis for restaging of her disease. The patient was advised to call me immediately if she has any concerning symptoms in the interval.  All questions were answered. The patient knows to call the clinic with any problems, questions or concerns. We can certainly see the patient much sooner if necessary.  I spent 15 minutes counseling the patient face to face. The total time spent in the appointment was 25 minutes.

## 2012-09-08 NOTE — Patient Instructions (Signed)
No evidence for disease progression on his recent scan except for a slightly increased right upper lobe nodule. Continue treatment with Tarceva. Followup in 3 months with repeat CT scan of the chest, abdomen and pelvis as well as the neck

## 2012-09-11 ENCOUNTER — Other Ambulatory Visit: Payer: Self-pay | Admitting: Medical Oncology

## 2012-09-11 DIAGNOSIS — C3430 Malignant neoplasm of lower lobe, unspecified bronchus or lung: Secondary | ICD-10-CM

## 2012-09-11 MED ORDER — ERLOTINIB HCL 150 MG PO TABS: 150.0000 mg | ORAL_TABLET | Freq: Every day | ORAL | Status: DC

## 2012-09-11 NOTE — Telephone Encounter (Signed)
Faxed tarceva refill to [facility] 

## 2012-10-14 DIAGNOSIS — Z85118 Personal history of other malignant neoplasm of bronchus and lung: Secondary | ICD-10-CM | POA: Diagnosis not present

## 2012-11-25 IMAGING — CT CT CHEST W/ CM
2 of 5 series · 17 of 46 positions shown, 19 images · IV contrast (omnipaque)
Comparison: CT 02/06/2010

CT CHEST

CLINICAL DATA: Lung cancer, restaging.  Diagnosed 7007.
Chemotherapy in progress.  Prior brain metastasis with gamma knife
therapy.  Prior left lower lobectomy.

CT CHEST, ABDOMEN AND PELVIS WITH CONTRAST
TECHNIQUE: Multidetector CT imaging of the chest, abdomen and
pelvis was performed following the standard protocol during bolus
administration of intravenous contrast.
Contrast: 125 ml Omnipaque 300

[Series 2: cap with st · axial · 0.83mm/px · z∈[-643,-68]mm · 14 of 131 slices shown, 16 images]
[im 8/131  soft-tissue]
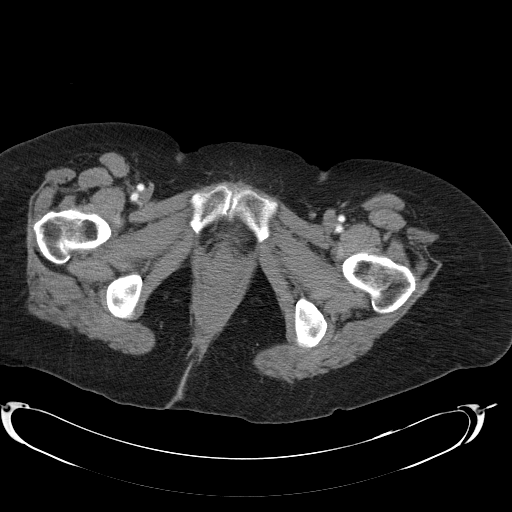
[im 8/131  bone]
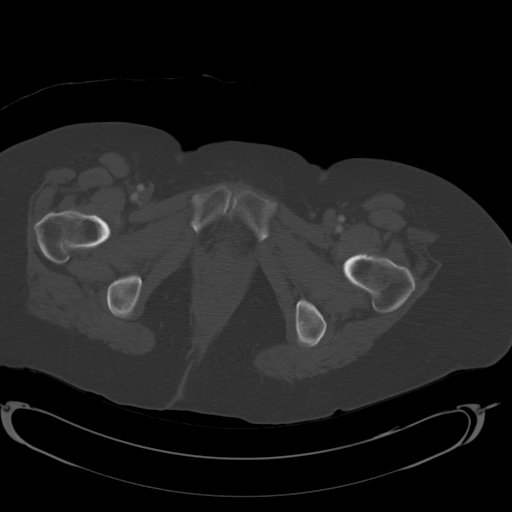
[im 15/131  soft-tissue]
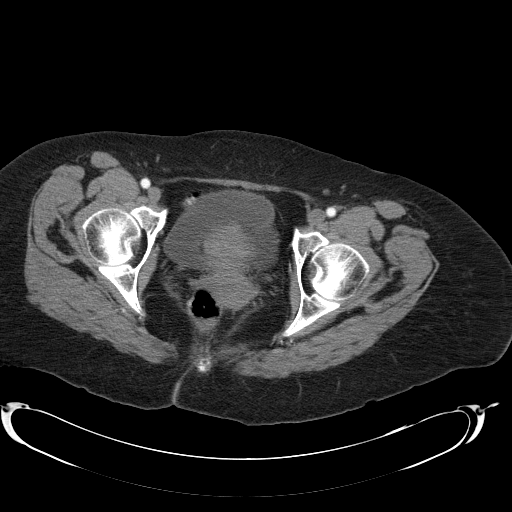
[im 29/131  soft-tissue]
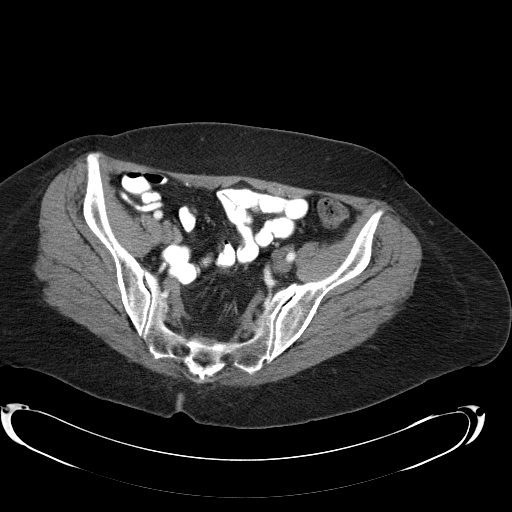
[im 37/131  soft-tissue]
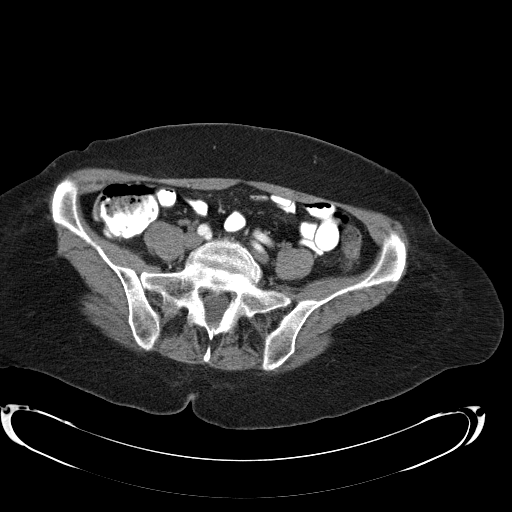
[im 44/131  soft-tissue]
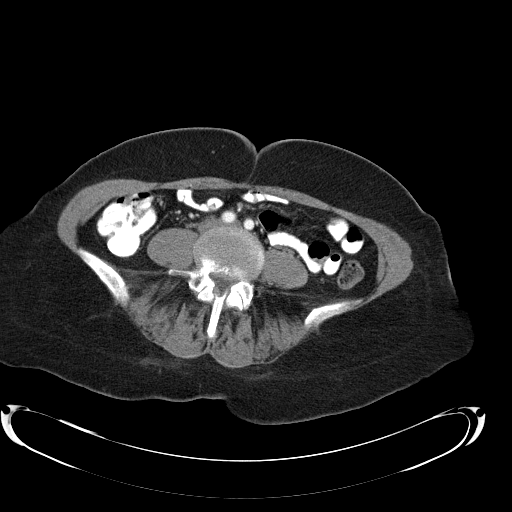
[im 51/131  soft-tissue]
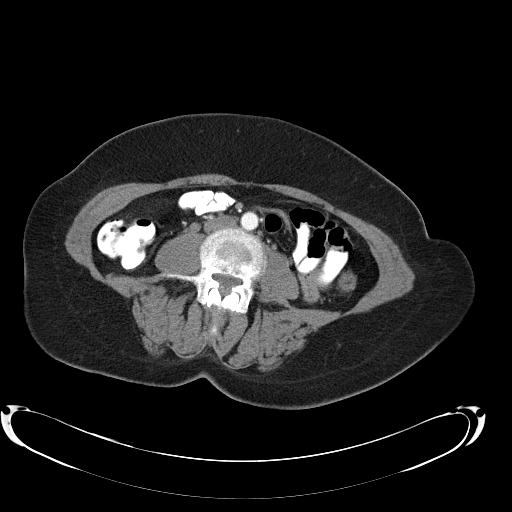
[im 58/131  soft-tissue]
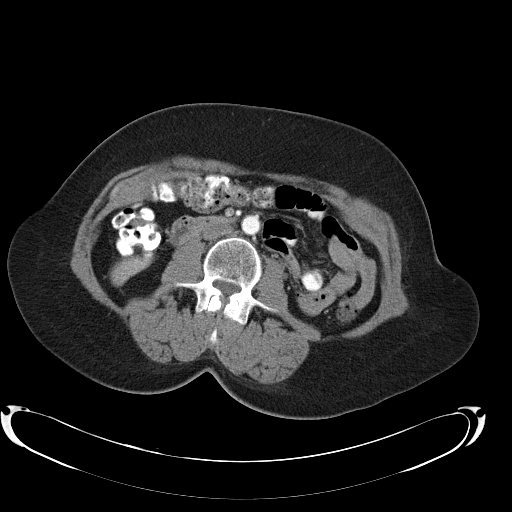
[im 73/131  soft-tissue]
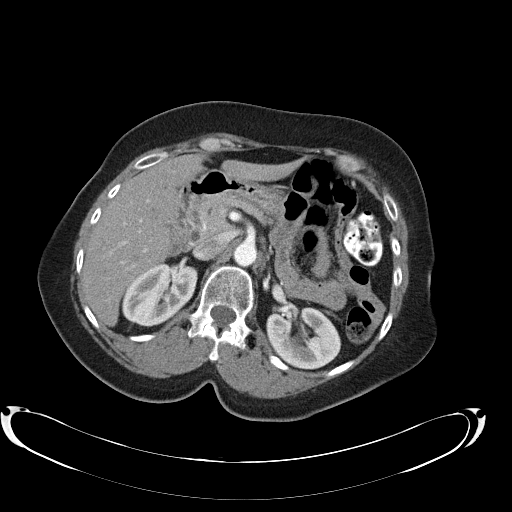
[im 80/131  soft-tissue]
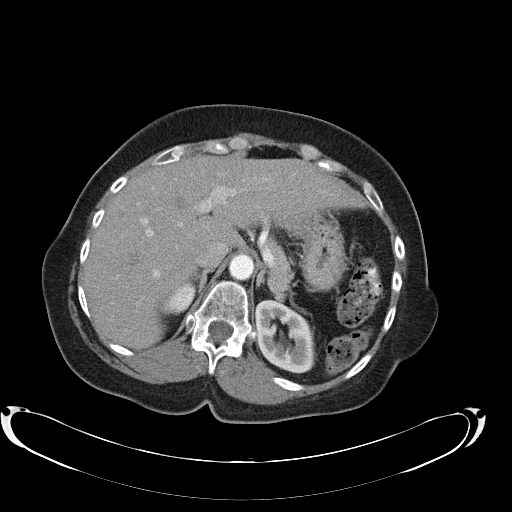
[im 80/131  bone]
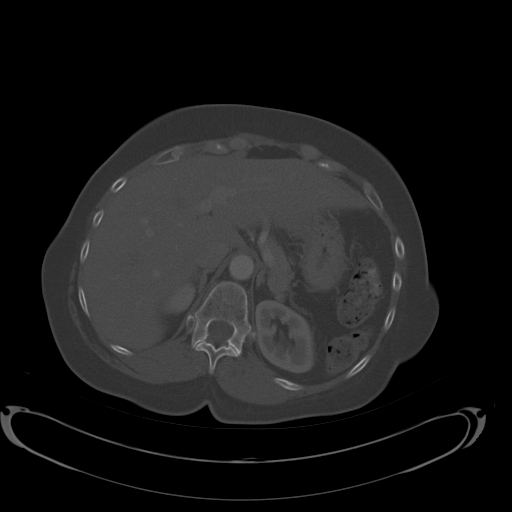
[im 87/131  soft-tissue]
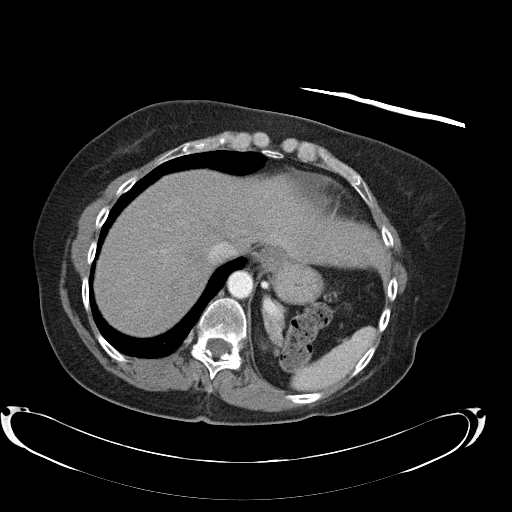
[im 94/131  soft-tissue]
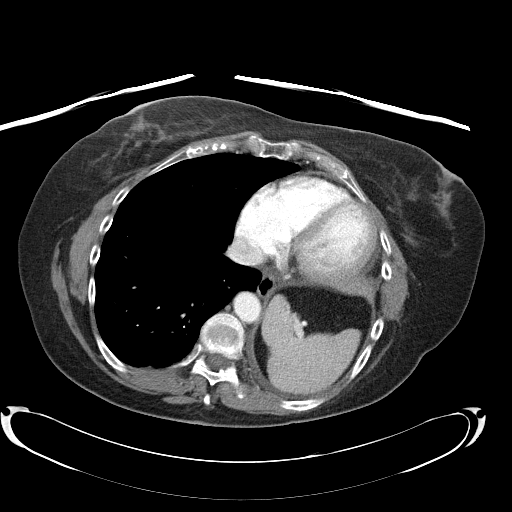
[im 102/131  soft-tissue]
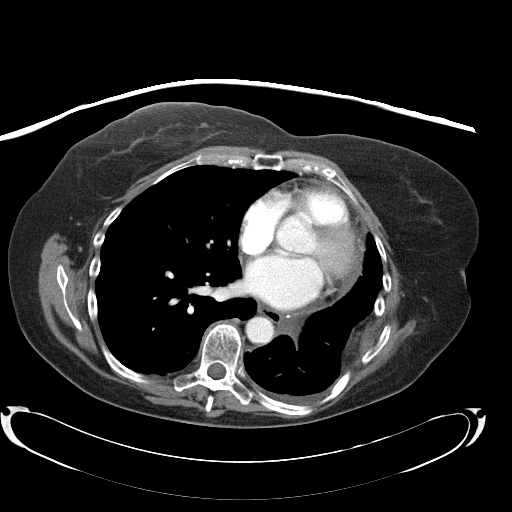
[im 116/131  soft-tissue]
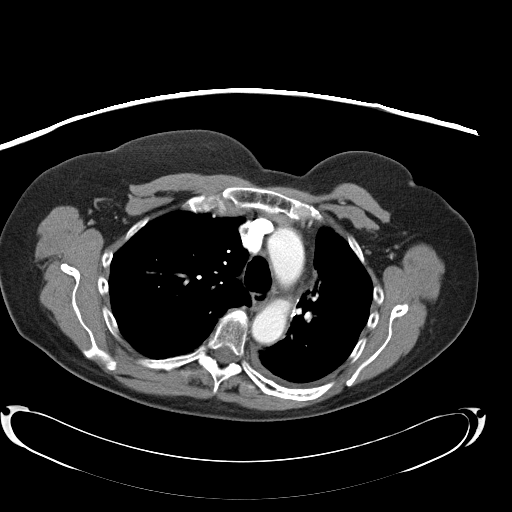
[im 123/131  soft-tissue]
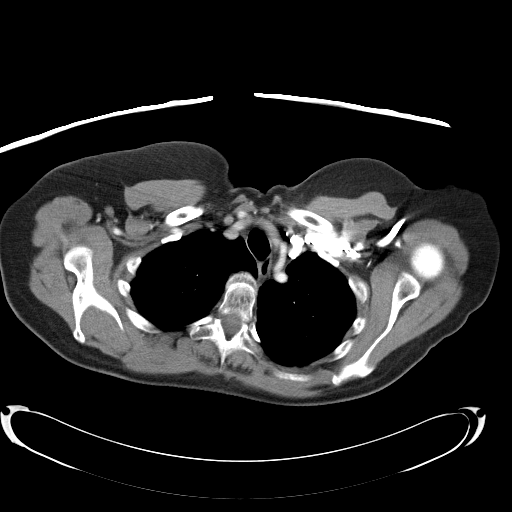

[Series 602: <mpr thick range> · coronal · 1.27mm/px · 3 of 95 slices shown]
[im 32/95  soft-tissue]
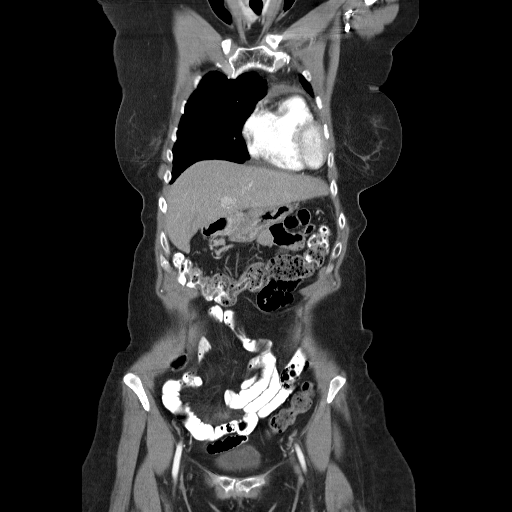
[im 42/95  soft-tissue]
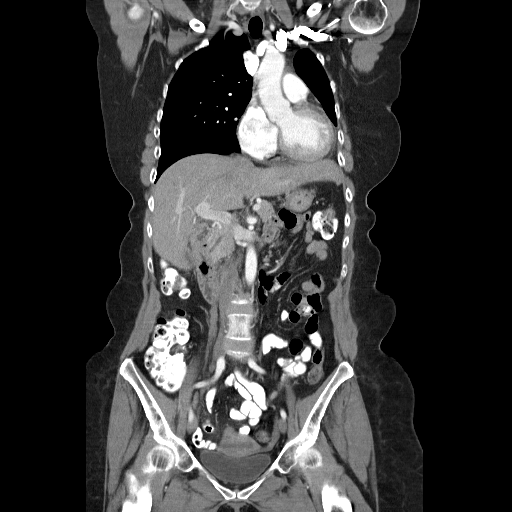
[im 53/95  soft-tissue]
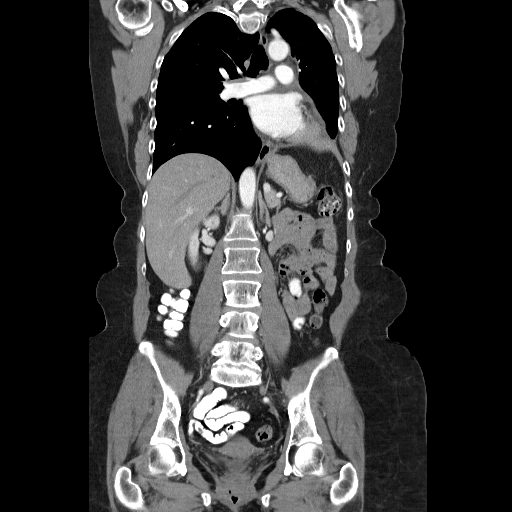

[17 of 46 positions shown; findings below may reference images not displayed]

FINDINGS: No evidence of axillary or supraclavicular
lymphadenopathy.  No mediastinal or hilar lymphadenopathy.  No
pericardial fluid.  There is postsurgical change in the left
hemithorax with volume loss and paramediastinal fibrosis and
bronchiectasis.  This is all stable compared to prior. Thoracotomy
defect on the lateral chest wall.  No  new or suspicious pulmonary
nodules.
IMPRESSION: 1.  Stable postoperative change in the left hemithorax.
2.  No evidence metastasis.

CT ABDOMEN AND PELVIS
FINDINGS: No focal hepatic lesion.  The gallbladder, spleen,
pancreas, adrenal glands, and kidneys are normal.

The stomach, small bowel, appendix, and cecum are normal.  Colon
and rectosigmoid colon are normal.

Abdominal aorta normal caliber.  No retroperitoneal
lymphadenopathy.

No free fluid the pelvis.  The bladder uterus and ovaries are
normal.  No pelvic lymphadenopathy. Review of  bone windows
demonstrates no aggressive osseous lesions. Stable  sclerotic
lesions within the anterior aspects of the T6 and T7 vertebral
bodies.
IMPRESSION: No evidence of metastasis within the abdomen or pelvis.

## 2012-12-03 ENCOUNTER — Encounter: Payer: Self-pay | Admitting: Neurology

## 2012-12-04 ENCOUNTER — Other Ambulatory Visit: Payer: Self-pay

## 2012-12-04 DIAGNOSIS — R413 Other amnesia: Secondary | ICD-10-CM

## 2012-12-04 MED ORDER — DONEPEZIL HCL 10 MG PO TABS: 10.0000 mg | ORAL_TABLET | Freq: Every day | ORAL | Status: DC

## 2012-12-04 NOTE — Telephone Encounter (Signed)
Patient requesting refill to last until appt.  She states [facility] has closed and been purchased by [facility] on [address].

## 2012-12-08 ENCOUNTER — Other Ambulatory Visit: Payer: Medicare Other | Admitting: Lab

## 2012-12-09 ENCOUNTER — Other Ambulatory Visit (HOSPITAL_COMMUNITY): Payer: Medicare Other

## 2012-12-09 ENCOUNTER — Other Ambulatory Visit (HOSPITAL_BASED_OUTPATIENT_CLINIC_OR_DEPARTMENT_OTHER): Payer: Medicare Other | Admitting: Lab

## 2012-12-09 ENCOUNTER — Ambulatory Visit (HOSPITAL_COMMUNITY)
Admission: RE | Admit: 2012-12-09 | Discharge: 2012-12-09 | Disposition: A | Discharge status: Home / Self Care | Payer: Medicare Other | Source: Ambulatory Visit | Attending: Internal Medicine | Admitting: Internal Medicine

## 2012-12-09 DIAGNOSIS — E0789 Other specified disorders of thyroid: Secondary | ICD-10-CM | POA: Diagnosis not present

## 2012-12-09 DIAGNOSIS — C3490 Malignant neoplasm of unspecified part of unspecified bronchus or lung: Secondary | ICD-10-CM | POA: Insufficient documentation

## 2012-12-09 DIAGNOSIS — Z923 Personal history of irradiation: Secondary | ICD-10-CM | POA: Diagnosis not present

## 2012-12-09 DIAGNOSIS — C7800 Secondary malignant neoplasm of unspecified lung: Secondary | ICD-10-CM | POA: Diagnosis not present

## 2012-12-09 DIAGNOSIS — Z902 Acquired absence of lung [part of]: Secondary | ICD-10-CM | POA: Diagnosis not present

## 2012-12-09 DIAGNOSIS — J90 Pleural effusion, not elsewhere classified: Secondary | ICD-10-CM | POA: Insufficient documentation

## 2012-12-09 DIAGNOSIS — E079 Disorder of thyroid, unspecified: Secondary | ICD-10-CM | POA: Diagnosis not present

## 2012-12-09 DIAGNOSIS — C3430 Malignant neoplasm of lower lobe, unspecified bronchus or lung: Secondary | ICD-10-CM | POA: Diagnosis not present

## 2012-12-09 DIAGNOSIS — R911 Solitary pulmonary nodule: Secondary | ICD-10-CM | POA: Diagnosis not present

## 2012-12-09 DIAGNOSIS — C801 Malignant (primary) neoplasm, unspecified: Secondary | ICD-10-CM | POA: Diagnosis not present

## 2012-12-09 LAB — CBC WITH DIFFERENTIAL/PLATELET
BASO%: 0.6 % (ref 0.0–2.0)
Basophils Absolute: 0 10*3/uL (ref 0.0–0.1)
EOS%: 5.6 % (ref 0.0–7.0)
Eosinophils Absolute: 0.2 10*3/uL (ref 0.0–0.5)
HCT: 40.2 % (ref 34.8–46.6)
HGB: 13.1 g/dL (ref 11.6–15.9)
LYMPH%: 20.1 % (ref 14.0–49.7)
MCH: 29.7 pg (ref 25.1–34.0)
MCHC: 32.6 g/dL (ref 31.5–36.0)
MCV: 91.1 fL (ref 79.5–101.0)
MONO#: 0.4 10*3/uL (ref 0.1–0.9)
MONO%: 8.7 % (ref 0.0–14.0)
NEUT#: 2.7 10*3/uL (ref 1.5–6.5)
NEUT%: 65 % (ref 38.4–76.8)
Platelets: 242 10*3/uL (ref 145–400)
RBC: 4.42 10*6/uL (ref 3.70–5.45)
RDW: 13.9 % (ref 11.2–14.5)
WBC: 4.2 10*3/uL (ref 3.9–10.3)
lymph#: 0.8 10*3/uL — ABNORMAL LOW (ref 0.9–3.3)

## 2012-12-09 LAB — COMPREHENSIVE METABOLIC PANEL (CC13)
ALT: 18 U/L (ref 0–55)
AST: 16 U/L (ref 5–34)
Albumin: 3.6 g/dL (ref 3.5–5.0)
Alkaline Phosphatase: 78 U/L (ref 40–150)
BUN: 11.1 mg/dL (ref 7.0–26.0)
CO2: 27 mEq/L (ref 22–29)
Calcium: 9.5 mg/dL (ref 8.4–10.4)
Chloride: 106 mEq/L (ref 98–107)
Creatinine: 1 mg/dL (ref 0.6–1.1)
Glucose: 106 mg/dl — ABNORMAL HIGH (ref 70–99)
Potassium: 3.9 mEq/L (ref 3.5–5.1)
Sodium: 142 mEq/L (ref 136–145)
Total Bilirubin: 0.95 mg/dL (ref 0.20–1.20)
Total Protein: 6.6 g/dL (ref 6.4–8.3)

## 2012-12-09 MED ORDER — IOHEXOL 300 MG/ML  SOLN
50.0000 mL | Freq: Once | INTRAMUSCULAR | Status: AC | PRN
  Administered 2012-12-09: 50 mL via ORAL

## 2012-12-09 MED ORDER — IOHEXOL 300 MG/ML  SOLN
100.0000 mL | Freq: Once | INTRAMUSCULAR | Status: AC | PRN
  Administered 2012-12-09: 100 mL via INTRAVENOUS

## 2012-12-10 ENCOUNTER — Telehealth: Payer: Self-pay | Admitting: Internal Medicine

## 2012-12-10 ENCOUNTER — Ambulatory Visit: Payer: Medicare Other | Admitting: Internal Medicine

## 2012-12-10 ENCOUNTER — Other Ambulatory Visit: Payer: Self-pay | Admitting: *Deleted

## 2012-12-10 NOTE — Telephone Encounter (Signed)
lvm for pt regarding to r/s missed appt to 5.7...also mailed pt appt sched and letter

## 2012-12-12 ENCOUNTER — Other Ambulatory Visit: Payer: Self-pay | Admitting: *Deleted

## 2012-12-12 MED ORDER — ERLOTINIB HCL 150 MG PO TABS: 150.0000 mg | ORAL_TABLET | Freq: Every day | ORAL | Status: DC

## 2012-12-24 ENCOUNTER — Other Ambulatory Visit: Payer: Self-pay | Admitting: *Deleted

## 2012-12-24 ENCOUNTER — Ambulatory Visit (HOSPITAL_BASED_OUTPATIENT_CLINIC_OR_DEPARTMENT_OTHER): Payer: Medicare Other | Admitting: Internal Medicine

## 2012-12-24 ENCOUNTER — Telehealth: Payer: Self-pay | Admitting: Internal Medicine

## 2012-12-24 ENCOUNTER — Encounter: Payer: Self-pay | Admitting: Internal Medicine

## 2012-12-24 VITALS — BP 125/71 | HR 94 | Temp 97.4°F | Resp 18 | Ht 69.0 in | Wt 203.7 lb

## 2012-12-24 DIAGNOSIS — R21 Rash and other nonspecific skin eruption: Secondary | ICD-10-CM

## 2012-12-24 DIAGNOSIS — C7949 Secondary malignant neoplasm of other parts of nervous system: Secondary | ICD-10-CM | POA: Diagnosis not present

## 2012-12-24 DIAGNOSIS — C3490 Malignant neoplasm of unspecified part of unspecified bronchus or lung: Secondary | ICD-10-CM

## 2012-12-24 DIAGNOSIS — C7931 Secondary malignant neoplasm of brain: Secondary | ICD-10-CM | POA: Diagnosis not present

## 2012-12-24 DIAGNOSIS — C3430 Malignant neoplasm of lower lobe, unspecified bronchus or lung: Secondary | ICD-10-CM | POA: Diagnosis not present

## 2012-12-24 DIAGNOSIS — R197 Diarrhea, unspecified: Secondary | ICD-10-CM

## 2012-12-24 MED ORDER — ERLOTINIB HCL 150 MG PO TABS: 150.0000 mg | ORAL_TABLET | Freq: Every day | ORAL | Status: DC

## 2012-12-24 NOTE — Telephone Encounter (Signed)
gv and printed appt sched and avs for pt...pt awrae cs will contact with d/t for ct.

## 2012-12-24 NOTE — Progress Notes (Signed)
[facility] [phone]   [phone]  OFFICE PROGRESS NOTE  [name], MD 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   PRIOR THERAPY:  1. Status post left lower lobectomy under the care of Dr Edwyna Shell in October 2002. 2. Status post course of concurrent chemoradiation with weekly carboplatin and paclitaxel under the care of Dr Truett Perna and Dr Dorna Bloom in early 2003. 3. Status post treatment with Celebrex and Iressa according to the clinical trial at [facility] for a total of 1 year. 4. Status post left occipital craniotomy for tumor resection on September 21, 2005, under the care of Dr Franky Macho. 5. Status post whole brain irradiation under the care of Dr Dorna Bloom, completed November 07, 2005. 6. Status post gamma knife treatment to the resected cavitary recurrence on June 02, 2008, at [facility]. 7. Status post right thyroidectomy with radical neck dissection under the care of Dr. Pollyann Kennedy on 10/18/2011.  CURRENT THERAPY: Tarceva 150 mg by mouth daily with therapy beginning 06/03/2007.   INTERVAL HISTORY: Valerie [name] 67 y.o. female returns to the clinic today for three-month followup visit. The patient is feeling fine today with no specific complaints except for mild skin rash mainly on the upper extremities in the sun exposed area as well as few episodes of diarrhea every now and then resolved with Imodium. The patient is tolerating her treatment was Tarceva fairly well except for the previous complaints. She has mild fatigue. She denied having any significant chest pain but continues to have shortness breath with exertion, no cough or hemoptysis. She denied having any significant weight loss or night sweats. She has repeat CT scan of the neck, chest, abdomen and pelvis performed recently and  she is here for evaluation and discussion of her scan results.  MEDICAL HISTORY: Past Medical History  Diagnosis Date  . Heart murmur   . Shortness of breath     hx lung ca   . Arthritis   . Lung cancer dx'd 2002  . Metastasis to brain dx'd 2008  . 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 Anal fistula   . H/O osteoporosis   . H/O vitamin D deficiency   . History of measles, mumps, or rubella   . H/O varicella   . Hypertension   . Yeast infection   . Abnormal Pap smear 2006  . Atrophic vaginitis 2008  . Dyspareunia 2008  . Dementia 2009  . Osteoporosis 2010  . Status post chemotherapy 2003    CARBOPLATIN/PACLITAXEL /STATUS POST CLINICAL TRAIL OF CELEBREX AND IRESSA AT [facility] FOR 1 YEAR  . Status post radiation therapy 2003    LEFT LUNG  . Status post radiation therapy 11/07/2005    WHOLE BRAIN: DR Jill Alexanders [facility]  . Status post radiation therapy 06/02/2008    GAMMA KNIFE OF RESECTED CAVITAY  . On antineoplastic chemotherapy     TARCEVA  . Nodule of right lung CT- 06/03/12    RIGHT UPPER LOBE  . Thyroid adenoma     ?  [name] Thyroid cancer 10/18/11 bx    adenoid nodules   . Anxiety   . Cataract   . Nodule of right lung 06/03/12    Upper Lobe    ALLERGIES:  has No Known Allergies.  MEDICATIONS:  Current Outpatient Prescriptions  Medication Sig Dispense Refill  .  alendronate (FOSAMAX) 70 MG tablet Take 1 tablet (70 mg total) by mouth every 7 (seven) days. Saturdays; Take with a full glass of water on an empty stomach.  4 tablet  12  . Ascorbic Acid (VITAMIN C PO) Take 1 tablet by mouth daily. Takes twice a wek      . aspirin EC 81 MG tablet Take 81 mg by mouth every other day.       . Cholecalciferol (VITAMIN D3) 2000 UNITS TABS Take 2,000 Int'l Units by mouth every other day.       . donepezil (ARICEPT) 10 MG tablet Take 1 tablet (10 mg total) by mouth daily.  90 tablet  0  . erlotinib (TARCEVA) 150 MG tablet Take 1 tablet (150 mg total) by mouth daily. Take on an empty  stomach 1 hour before meals or 2 hours after.  30 tablet  0  . Multiple Vitamin (MULITIVITAMIN WITH MINERALS) TABS Take 1 tablet by mouth daily.      . naphazoline-pheniramine (NAPHCON-A) 0.025-0.3 % ophthalmic solution Place 1 drop into both eyes 4 (four) times daily as needed. For dry eyes       No current facility-administered medications for this visit.    SURGICAL HISTORY:  Past Surgical History  Procedure Laterality Date  . Left occipital craniotomy  09/21/2005    tumor  . Left lower lobectomy  05/2001  . Radical neck dissection  10/18/2011    Procedure: RADICAL NECK DISSECTION;  Surgeon: Serena Colonel, MD;  Location: [facility] OR;  Service: ENT;  Laterality: Right;  RIGHT MODIFIED NECK DISSECTION /POSSIBLE RIGHT THYROIDECTOM  . Thyroidectomy  10/18/2011    Procedure: THYROIDECTOMY WITH RADICAL NECK DISSECTION;  Surgeon: Serena Colonel, MD;  Location: [facility] OR;  Service: ENT;  Laterality: Right;    REVIEW OF SYSTEMS:  A comprehensive review of systems was negative except for: Constitutional: positive for fatigue Respiratory: positive for dyspnea on exertion Gastrointestinal: positive for diarrhea   PHYSICAL EXAMINATION: General appearance: alert, cooperative and no distress Head: Normocephalic, without obvious abnormality, atraumatic Neck: no adenopathy Lymph nodes: Cervical, supraclavicular, and axillary nodes normal. Resp: clear to auscultation bilaterally Cardio: regular rate and rhythm, S1, S2 normal, no murmur, click, rub or gallop GI: soft, non-tender; bowel sounds normal; no masses,  no organomegaly Extremities: extremities normal, atraumatic, no cyanosis or edema Neurologic: Alert and oriented X 3, normal strength and tone. Normal symmetric reflexes. Normal coordination and gait  ECOG PERFORMANCE STATUS: 1 - Symptomatic but completely ambulatory  Blood pressure 125/71, pulse 94, temperature 97.4 F (36.3 C), temperature source Oral, resp. rate 18, height 5\' 9"  (1.753 m), weight 203 lb  11.2 oz (92.398 kg).  LABORATORY DATA: Lab Results  Component Value Date   WBC 4.2 12/09/2012   HGB 13.1 12/09/2012   HCT 40.2 12/09/2012   MCV 91.1 12/09/2012   PLT 242 12/09/2012      Chemistry      Component Value Date/Time   NA 142 12/09/2012 0806   NA 138 02/28/2012 0939   NA 140 01/23/2012 0941   K 3.9 12/09/2012 0806   K 4.6 02/28/2012 0939   K 3.6 01/23/2012 0941   CL 106 12/09/2012 0806   CL 99 02/28/2012 0939   CL 105 01/23/2012 0941   CO2 27 12/09/2012 0806   CO2 28 02/28/2012 0939   CO2 26 01/23/2012 0941   BUN 11.1 12/09/2012 0806   BUN 12 02/28/2012 0939   BUN 11 01/23/2012 0941   CREATININE 1.0 12/09/2012  0806   CREATININE 1.0 02/28/2012 0939   CREATININE 0.83 01/23/2012 0941      Component Value Date/Time   CALCIUM 9.5 12/09/2012 0806   CALCIUM 9.1 02/28/2012 0939   CALCIUM 9.5 01/23/2012 0941   ALKPHOS 78 12/09/2012 0806   ALKPHOS 80 02/28/2012 0939   ALKPHOS 72 01/23/2012 0941   AST 16 12/09/2012 0806   AST 20 02/28/2012 0939   AST 17 01/23/2012 0941   ALT 18 12/09/2012 0806   ALT 17 01/23/2012 0941   BILITOT 0.95 12/09/2012 0806   BILITOT 1.30 02/28/2012 0939   BILITOT 1.3* 01/23/2012 0941       RADIOGRAPHIC STUDIES: Ct Soft Tissue Neck W Contrast  12/09/2012  *RADIOLOGY REPORT*  Clinical Data: Metastatic lung cancer.  CT NECK WITH CONTRAST  Technique:  Multidetector CT imaging of the neck was performed with intravenous contrast.  Contrast: OMNIPAQUE IOHEXOL 300 MG/ML  SOLN  Comparison: CT neck 09/04/2012  Findings: Right thyroidectomy, unchanged from the prior study.  No mass lesion in the right thyroid bed.  Left thyroid is normal in size with a 6 mm hypodense lesion, unchanged from the  prior CT.  No pathologic adenopathy in the neck. Left supraclavicular lymph node is stable measuring 6 x 8 mm.  Fatty replacement of the right parotid gland is unchanged.  Left parotid is normal.  Submandibular gland is normal bilaterally.  No acute abnormality in the cervical spine.  Carotid  arteries  and vertebral arteries are patent bilaterally.  IMPRESSION: No change from the prior study.  No mass or adenopathy is present.   Original Report Authenticated By: Janeece Riggers, M.D.    Ct Chest W Contrast  12/09/2012  *RADIOLOGY REPORT*  Clinical Data:  Restaging.  Lung cancer.  CT CHEST, ABDOMEN AND PELVIS WITH CONTRAST  Technique:  Multidetector CT imaging of the chest, abdomen and pelvis was performed following the standard protocol during bolus administration of intravenous contrast.  Contrast: OMNIPAQUE IOHEXOL 300 MG/ML  SOLN  Comparison:  06/03/2012   CT CHEST  Findings:  Lungs/pleura: Small partially loculated left pleural effusion identified.  This is unchanged from previous exam.  Postsurgical changes from left lower lobectomy identified.  Nodule within the left apex measures 3 mm.  Unchanged from previous exam, image number 10/series 7.  4 mm left upper lobe nodule is identified, image 18/series 7.  This is compared with the same previously. Within the lingular portion of the left lung there is a 6 mm nodule, image 33/series 7.  This is compared with 5 mm previously. Right upper lobe nodule is identified measuring 4 mm, image 22/series 7.  This is compared with 5 mm previously. Centrally located right upper lobe nodule measures 11 mm, image 21/series 7. This is compared with 10 mm previously.  Heart/Mediastinum: The heart size is normal.  There is no pericardial effusion.  No enlarged or enlarging mediastinal or hilar lymph nodes identified.  Bones/Musculoskeletal:  No enlarged axillary or supraclavicular adenopathy.  Kyphosis deformity involves the upper thoracic spine. There are chronic left posterior lateral rib deformities.  No aggressive lytic or sclerotic bone lesions identified.  IMPRESSION:  1.  Right upper lobe nodule now measures 11 mm.  This is compared with 8 mm on 06/03/2012 and is concerning for neoplasm. 2.  Stable small nodules in the left lung.   CT ABDOMEN AND PELVIS   Findings:  Normal appearance of the liver.  The gallbladder is normal.  No biliary dilatation.  Normal appearance  of the pancreas. The spleen is normal.  The adrenal glands are both normal.  The right kidney is normal. The left kidney is normal.  Urinary bladder appears normal.  The uterus and the adnexal structures are unremarkable.  Normal caliber of the abdominal aorta.  No aneurysm.  There is no upper abdominal adenopathy noted.  No pelvic or inguinal adenopathy identified.  The stomach is normal.  The small bowel loops have a normal course and caliber.  The appendix is visualized and appears normal.  The colon is unremarkable.  No ascites or fluid collection identified within the upper abdomen.  Review of the visualized osseous structures is negative for aggressive lytic or sclerotic bone lesion.  IMPRESSION:  1.  No specific features to suggest metastatic disease to the abdomen or pelvis.   Original Report Authenticated By: Signa Kell, M.D.    ASSESSMENT: This is a very pleasant 67 years old African American female with metastatic non-small cell lung cancer currently on treatment with Tarceva 150 mg by mouth daily started more than 5 years ago. The patient is tolerating her treatment fairly well except for very mild skin rash and few episodes of diarrhea. She has no significant evidence for disease progression on his recent scan except for slightly enlarging right upper lobe nodule.   PLAN: I personally reviewed the scan and the images. I recommended for the patient to continue on Tarceva 150 mg by mouth daily. She was advised to continue on Imodium for the diarrhea. I would see her back for followup visit in 3 months with repeat CT scan of the neck and chest for reevaluation of her disease. She was advised to call me immediately if she has any concerning symptoms in the interval.  All questions were answered. The patient knows to call the clinic with any problems, questions or concerns. We can  certainly see the patient much sooner if necessary.  I spent 15 minutes counseling the patient face to face. The total time spent in the appointment was 25 minutes.

## 2012-12-24 NOTE — Patient Instructions (Signed)
No evidence for disease progression except for a slightly enlarging right upper lobe nodule. Continue Tarceva 150 mg by mouth daily. Followup visit in 3 months with repeat CT scan of the neck and chest.

## 2013-01-07 ENCOUNTER — Other Ambulatory Visit: Payer: Medicare Other | Admitting: Lab

## 2013-01-08 ENCOUNTER — Ambulatory Visit: Payer: Medicare Other | Admitting: Internal Medicine

## 2013-01-14 ENCOUNTER — Ambulatory Visit: Payer: Self-pay | Admitting: Neurology

## 2013-01-23 ENCOUNTER — Telehealth: Payer: Self-pay | Admitting: Medical Oncology

## 2013-01-23 NOTE — Telephone Encounter (Signed)
[employer] shipped Tarceva.

## 2013-03-03 DIAGNOSIS — H04129 Dry eye syndrome of unspecified lacrimal gland: Secondary | ICD-10-CM | POA: Diagnosis not present

## 2013-03-03 DIAGNOSIS — H2510 Age-related nuclear cataract, unspecified eye: Secondary | ICD-10-CM | POA: Diagnosis not present

## 2013-03-18 ENCOUNTER — Telehealth: Payer: Self-pay | Admitting: Internal Medicine

## 2013-03-18 NOTE — Telephone Encounter (Signed)
returned pt call and moved lab and est to day of ct.pt ok and aware

## 2013-03-19 ENCOUNTER — Other Ambulatory Visit: Payer: Self-pay | Admitting: Nurse Practitioner

## 2013-03-19 MED ORDER — ERLOTINIB HCL 150 MG PO TABS: 150.0000 mg | ORAL_TABLET | Freq: Every day | ORAL | Status: DC

## 2013-03-25 ENCOUNTER — Ambulatory Visit (HOSPITAL_COMMUNITY): Payer: Medicare Other

## 2013-03-25 ENCOUNTER — Other Ambulatory Visit: Payer: Medicare Other | Admitting: Lab

## 2013-03-26 ENCOUNTER — Ambulatory Visit: Payer: Medicare Other | Admitting: Internal Medicine

## 2013-03-30 ENCOUNTER — Other Ambulatory Visit: Payer: Self-pay

## 2013-03-30 DIAGNOSIS — Z1231 Encounter for screening mammogram for malignant neoplasm of breast: Secondary | ICD-10-CM

## 2013-04-01 ENCOUNTER — Ambulatory Visit (HOSPITAL_COMMUNITY)
Admission: RE | Admit: 2013-04-01 | Discharge: 2013-04-01 | Disposition: A | Discharge status: Home / Self Care | Payer: Medicare Other | Source: Ambulatory Visit | Attending: Internal Medicine | Admitting: Internal Medicine

## 2013-04-01 ENCOUNTER — Other Ambulatory Visit (HOSPITAL_BASED_OUTPATIENT_CLINIC_OR_DEPARTMENT_OTHER): Payer: Medicare Other

## 2013-04-01 ENCOUNTER — Encounter (HOSPITAL_COMMUNITY): Payer: Self-pay

## 2013-04-01 DIAGNOSIS — R911 Solitary pulmonary nodule: Secondary | ICD-10-CM | POA: Insufficient documentation

## 2013-04-01 DIAGNOSIS — Z79899 Other long term (current) drug therapy: Secondary | ICD-10-CM | POA: Diagnosis not present

## 2013-04-01 DIAGNOSIS — K110 Atrophy of salivary gland: Secondary | ICD-10-CM | POA: Diagnosis not present

## 2013-04-01 DIAGNOSIS — C73 Malignant neoplasm of thyroid gland: Secondary | ICD-10-CM | POA: Diagnosis not present

## 2013-04-01 DIAGNOSIS — C3490 Malignant neoplasm of unspecified part of unspecified bronchus or lung: Secondary | ICD-10-CM | POA: Insufficient documentation

## 2013-04-01 LAB — CBC WITH DIFFERENTIAL/PLATELET
BASO%: 0.7 % (ref 0.0–2.0)
Basophils Absolute: 0 10*3/uL (ref 0.0–0.1)
EOS%: 7.3 % — ABNORMAL HIGH (ref 0.0–7.0)
Eosinophils Absolute: 0.3 10*3/uL (ref 0.0–0.5)
HCT: 39.8 % (ref 34.8–46.6)
HGB: 13.4 g/dL (ref 11.6–15.9)
LYMPH%: 22 % (ref 14.0–49.7)
MCH: 30.7 pg (ref 25.1–34.0)
MCHC: 33.5 g/dL (ref 31.5–36.0)
MCV: 91.6 fL (ref 79.5–101.0)
MONO#: 0.4 10*3/uL (ref 0.1–0.9)
MONO%: 7.9 % (ref 0.0–14.0)
NEUT#: 2.8 10*3/uL (ref 1.5–6.5)
NEUT%: 62.1 % (ref 38.4–76.8)
Platelets: 221 10*3/uL (ref 145–400)
RBC: 4.35 10*6/uL (ref 3.70–5.45)
RDW: 14.4 % (ref 11.2–14.5)
WBC: 4.5 10*3/uL (ref 3.9–10.3)
lymph#: 1 10*3/uL (ref 0.9–3.3)

## 2013-04-01 LAB — COMPREHENSIVE METABOLIC PANEL (CC13)
ALT: 16 U/L (ref 0–55)
AST: 18 U/L (ref 5–34)
Albumin: 3.7 g/dL (ref 3.5–5.0)
Alkaline Phosphatase: 68 U/L (ref 40–150)
BUN: 8.4 mg/dL (ref 7.0–26.0)
CO2: 22 mEq/L (ref 22–29)
Calcium: 9.6 mg/dL (ref 8.4–10.4)
Chloride: 106 mEq/L (ref 98–109)
Creatinine: 0.9 mg/dL (ref 0.6–1.1)
Glucose: 97 mg/dl (ref 70–140)
Potassium: 4.1 mEq/L (ref 3.5–5.1)
Sodium: 142 mEq/L (ref 136–145)
Total Bilirubin: 1.21 mg/dL — ABNORMAL HIGH (ref 0.20–1.20)
Total Protein: 6.8 g/dL (ref 6.4–8.3)

## 2013-04-01 MED ORDER — IOHEXOL 300 MG/ML  SOLN: 50.0000 mL | Freq: Once | INTRAMUSCULAR | Status: DC | PRN

## 2013-04-01 MED ORDER — IOHEXOL 300 MG/ML  SOLN
100.0000 mL | Freq: Once | INTRAMUSCULAR | Status: AC | PRN
  Administered 2013-04-01: 100 mL via INTRAVENOUS

## 2013-04-02 ENCOUNTER — Ambulatory Visit (HOSPITAL_BASED_OUTPATIENT_CLINIC_OR_DEPARTMENT_OTHER): Payer: Medicare Other | Admitting: Internal Medicine

## 2013-04-02 ENCOUNTER — Encounter: Payer: Self-pay | Admitting: Internal Medicine

## 2013-04-02 ENCOUNTER — Telehealth: Payer: Self-pay | Admitting: Internal Medicine

## 2013-04-02 VITALS — BP 122/68 | HR 87 | Temp 98.7°F | Resp 18 | Ht 69.0 in | Wt 205.0 lb

## 2013-04-02 DIAGNOSIS — C7931 Secondary malignant neoplasm of brain: Secondary | ICD-10-CM | POA: Diagnosis not present

## 2013-04-02 DIAGNOSIS — C7949 Secondary malignant neoplasm of other parts of nervous system: Secondary | ICD-10-CM

## 2013-04-02 DIAGNOSIS — C3490 Malignant neoplasm of unspecified part of unspecified bronchus or lung: Secondary | ICD-10-CM

## 2013-04-02 DIAGNOSIS — C3430 Malignant neoplasm of lower lobe, unspecified bronchus or lung: Secondary | ICD-10-CM

## 2013-04-02 NOTE — Telephone Encounter (Signed)
Gave pt appt for lab and MD , pt will drink water based contrast for Ct on November 2014

## 2013-04-04 NOTE — Patient Instructions (Signed)
CURRENT THERAPY: Tarceva 150 mg by mouth daily with therapy beginning 06/03/2007.  CHEMOTHERAPY INTENT: palliative  CURRENT # OF CHEMOTHERAPY CYCLES: 82  CURRENT ANTIEMETICS: Compazine when necessary  CURRENT SMOKING STATUS: nonsmoker  ORAL CHEMOTHERAPY AND CONSENT: Tarceva  CURRENT BISPHOSPHONATES USE: None  PAIN MANAGEMENT: None  NARCOTICS INDUCED CONSTIPATION: None  LIVING WILL AND CODE STATUS: Full code

## 2013-04-04 NOTE — Progress Notes (Signed)
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   PRIOR THERAPY:  1. Status post left lower lobectomy under the care of Dr Edwyna Shell in October 2002. 2. Status post course of concurrent chemoradiation with weekly carboplatin and paclitaxel under the care of Dr Truett Perna and Dr Dorna Bloom in early 2003. 3. Status post treatment with Celebrex and Iressa according to the clinical trial at [facility] for a total of 1 year. 4. Status post left occipital craniotomy for tumor resection on September 21, 2005, under the care of Dr Franky Macho. 5. Status post whole brain irradiation under the care of Dr Dorna Bloom, completed November 07, 2005. 6. Status post gamma knife treatment to the resected cavitary recurrence on June 02, 2008, at [facility]. 7. Status post right thyroidectomy with radical neck dissection under the care of Dr. Pollyann Kennedy on 10/18/2011.  CURRENT THERAPY: Tarceva 150 mg by mouth daily with therapy beginning 06/03/2007.  CHEMOTHERAPY INTENT: palliative  CURRENT # OF CHEMOTHERAPY CYCLES: 82  CURRENT ANTIEMETICS: Compazine when necessary  CURRENT SMOKING STATUS: nonsmoker  ORAL CHEMOTHERAPY AND CONSENT: Tarceva  CURRENT BISPHOSPHONATES USE: None  PAIN MANAGEMENT: None  NARCOTICS INDUCED CONSTIPATION: None  LIVING WILL AND CODE STATUS: Full code   INTERVAL HISTORY: Valerie [name] 67 y.o. female returns to the clinic today for three-month follow up visit. The patient is feeling fine today with no specific complaints. She denied having any significant chest pain, shortness breath, cough or hemoptysis. The patient denied having any nausea or vomiting. She had no weight loss or night sweats. She is tolerating her  treatment was Tarceva fairly well with no significant skin rash but 1-2 episodes of diarrhea occasionally. The patient had repeat CT scan of the chest, abdomen and pelvis performed recently and she is here for evaluation and discussion of her scan results.  MEDICAL HISTORY: Past Medical History  Diagnosis Date  . Heart murmur   . Shortness of breath     hx lung ca   . Arthritis   . Lung cancer dx'd 2002  . Metastasis to brain dx'd 2008  . 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 Anal fistula   . H/O osteoporosis   . H/O vitamin D deficiency   . History of measles, mumps, or rubella   . H/O varicella   . Hypertension   . Yeast infection   . Abnormal Pap smear 2006  . Atrophic vaginitis 2008  . Dyspareunia 2008  . Dementia 2009  . Osteoporosis 2010  . Status post chemotherapy 2003    CARBOPLATIN/PACLITAXEL /STATUS POST CLINICAL TRAIL OF CELEBREX AND IRESSA AT [facility] FOR 1 YEAR  . Status post radiation therapy 2003    LEFT LUNG  . Status post radiation therapy 11/07/2005    WHOLE BRAIN: DR Jill Alexanders [facility]  . Status post radiation therapy 06/02/2008    GAMMA KNIFE OF RESECTED CAVITAY  . On antineoplastic chemotherapy     TARCEVA  . Nodule of right lung CT- 06/03/12    RIGHT UPPER LOBE  . Thyroid adenoma     ?  [name] Thyroid cancer 10/18/11 bx    adenoid nodules   . Anxiety   . Cataract   . Nodule of right lung 06/03/12    Upper Lobe  ALLERGIES:  has No Known Allergies.  MEDICATIONS:  Current Outpatient Prescriptions  Medication Sig Dispense Refill  . alendronate (FOSAMAX) 70 MG tablet Take 1 tablet (70 mg total) by mouth every 7 (seven) days. Saturdays; Take with a full glass of water on an empty stomach.  4 tablet  12  . Ascorbic Acid (VITAMIN C PO) Take 1 tablet by mouth daily. Takes twice a wek      . aspirin EC 81 MG tablet Take 81 mg by mouth every other day.       . Cholecalciferol (VITAMIN D3) 2000 UNITS TABS Take 2,000 Int'l Units by mouth every other day.       .  donepezil (ARICEPT) 10 MG tablet Take 1 tablet (10 mg total) by mouth daily.  90 tablet  0  . erlotinib (TARCEVA) 150 MG tablet Take 1 tablet (150 mg total) by mouth daily. Take on an empty stomach 1 hour before meals or 2 hours after.  30 tablet  2  . loperamide (IMODIUM) 2 MG capsule Take 2 mg by mouth 4 (four) times daily as needed for diarrhea or loose stools.      . Multiple Vitamin (MULITIVITAMIN WITH MINERALS) TABS Take 1 tablet by mouth daily.      . naphazoline-pheniramine (NAPHCON-A) 0.025-0.3 % ophthalmic solution Place 1 drop into both eyes 4 (four) times daily as needed. For dry eyes       No current facility-administered medications for this visit.    SURGICAL HISTORY:  Past Surgical History  Procedure Laterality Date  . Left occipital craniotomy  09/21/2005    tumor  . Left lower lobectomy  05/2001  . Radical neck dissection  10/18/2011    Procedure: RADICAL NECK DISSECTION;  Surgeon: Serena Colonel, MD;  Location: [facility] OR;  Service: ENT;  Laterality: Right;  RIGHT MODIFIED NECK DISSECTION /POSSIBLE RIGHT THYROIDECTOM  . Thyroidectomy  10/18/2011    Procedure: THYROIDECTOMY WITH RADICAL NECK DISSECTION;  Surgeon: Serena Colonel, MD;  Location: [facility] OR;  Service: ENT;  Laterality: Right;    REVIEW OF SYSTEMS:  A comprehensive review of systems was negative.   PHYSICAL EXAMINATION: General appearance: alert, cooperative, fatigued and no distress Head: Normocephalic, without obvious abnormality, atraumatic Neck: no adenopathy Lymph nodes: Cervical, supraclavicular, and axillary nodes normal. Resp: clear to auscultation bilaterally Cardio: regular rate and rhythm, S1, S2 normal, no murmur, click, rub or gallop GI: soft, non-tender; bowel sounds normal; no masses,  no organomegaly Extremities: extremities normal, atraumatic, no cyanosis or edema Neurologic: Alert and oriented X 3, normal strength and tone. Normal symmetric reflexes. Normal coordination and gait  ECOG PERFORMANCE STATUS:  1 - Symptomatic but completely ambulatory  Blood pressure 122/68, pulse 87, temperature 98.7 F (37.1 C), temperature source Oral, resp. rate 18, height 5\' 9"  (1.753 m), weight 205 lb (92.987 kg), SpO2 99.00%.  LABORATORY DATA: Lab Results  Component Value Date   WBC 4.5 04/01/2013   HGB 13.4 04/01/2013   HCT 39.8 04/01/2013   MCV 91.6 04/01/2013   PLT 221 04/01/2013      Chemistry      Component Value Date/Time   NA 142 04/01/2013 0848   NA 138 02/28/2012 0939   NA 140 01/23/2012 0941   K 4.1 04/01/2013 0848   K 4.6 02/28/2012 0939   K 3.6 01/23/2012 0941   CL 106 12/09/2012 0806   CL 99 02/28/2012 0939   CL 105 01/23/2012 0941   CO2 22 04/01/2013 0848   CO2 28  02/28/2012 0939   CO2 26 01/23/2012 0941   BUN 8.4 04/01/2013 0848   BUN 12 02/28/2012 0939   BUN 11 01/23/2012 0941   CREATININE 0.9 04/01/2013 0848   CREATININE 1.0 02/28/2012 0939   CREATININE 0.83 01/23/2012 0941      Component Value Date/Time   CALCIUM 9.6 04/01/2013 0848   CALCIUM 9.1 02/28/2012 0939   CALCIUM 9.5 01/23/2012 0941   ALKPHOS 68 04/01/2013 0848   ALKPHOS 80 02/28/2012 0939   ALKPHOS 72 01/23/2012 0941   AST 18 04/01/2013 0848   AST 20 02/28/2012 0939   AST 17 01/23/2012 0941   ALT 16 04/01/2013 0848   ALT 21 02/28/2012 0939   ALT 17 01/23/2012 0941   BILITOT 1.21* 04/01/2013 0848   BILITOT 1.30 02/28/2012 0939   BILITOT 1.3* 01/23/2012 0941       RADIOGRAPHIC STUDIES: Ct Soft Tissue Neck W Contrast  04/01/2013   *RADIOLOGY REPORT*  Clinical Data:  Lung cancer diagnosed 2002 and 2008, thyroid cancer diagnosed 2013, status post partial thyroidectomy, tarceva in progress  CT NECK AND CHEST WITH CONTRAST  Technique:  Multidetector CT imaging of the neck and chest was performed using the standard protocol after bolus administration of intravenous contrast.  Contrast: OMNIPAQUE IOHEXOL 300 MG/ML  SOLN  Comparison:  12/09/2012  CT NECK  Findings:  Status post right thyroidectomy.  Small right level Ib lymph nodes measuring up to  4 mm short axis (series 4/image 57), within normal limits.  No suspicious cervical lymphadenopathy.  Fatty atrophy of the right parotid gland.  Pharyngeal soft tissues are unremarkable.  The airway remains patent.  The visualized paranasal sinuses are essentially clear. The mastoid air cells are unopacified.  Visualized brain parenchyma is unremarkable.  Cervical spine is within normal limits.  IMPRESSION: Status post right thyroidectomy.  No evidence of recurrent or metastatic disease in the neck.  CT CHEST  Findings: Status post left lower lobectomy with associated volume loss in the left hemithorax.  Suspected wedge resection in the medial left lung apex (series 7/image 15).  Associated paramediastinal radiation changes.  Multiple bilateral pulmonary nodules, including: --3 mm nodule the left lung apex (series 7/image 11), unchanged --4 mm nodule in the anterior left upper lobe (series 7/image 20), unchanged --12 x 11 mm right upper lobe nodule (series 7/image 21), previously 12 x 10 mm --4 mm nodule in the lateral right upper lobe (series 7/image 22), unchanged --9 mm nodule in the central left lower lung (series 7/image 33), previously 8 mm --6 mm nodule in the left lower lung (series 7/image 34), unchanged  Trace left pleural effusion.  No pneumothorax.  The heart is normal in size.  Trace pericardial fluid.  No suspicious mediastinal, hilar, or axillary lymphadenopathy.  Visualized upper abdomen is unremarkable.  Degenerative changes of the thoracic spine.  IMPRESSION: Status post left lower lobectomy with radiation changes in the left hemithorax.  Multiple bilateral pulmonary nodules, as described above, suspicious for metastases but grossly unchanged.  Dominant right upper lobe nodule measures 12 x 11 mm, previously 12 x 10 mm.   Original Report Authenticated By: Charline Bills, M.D.   Ct Chest W Contrast  04/01/2013   *RADIOLOGY REPORT*  Clinical Data:  Lung cancer diagnosed 2002 and 2008, thyroid  cancer diagnosed 2013, status post partial thyroidectomy, tarceva in progress  CT NECK AND CHEST WITH CONTRAST  Technique:  Multidetector CT imaging of the neck and chest was performed using the standard protocol after bolus  administration of intravenous contrast.  Contrast: OMNIPAQUE IOHEXOL 300 MG/ML  SOLN  Comparison:  12/09/2012  CT NECK  Findings:  Status post right thyroidectomy.  Small right level Ib lymph nodes measuring up to 4 mm short axis (series 4/image 57), within normal limits.  No suspicious cervical lymphadenopathy.  Fatty atrophy of the right parotid gland.  Pharyngeal soft tissues are unremarkable.  The airway remains patent.  The visualized paranasal sinuses are essentially clear. The mastoid air cells are unopacified.  Visualized brain parenchyma is unremarkable.  Cervical spine is within normal limits.  IMPRESSION: Status post right thyroidectomy.  No evidence of recurrent or metastatic disease in the neck.  CT CHEST  Findings: Status post left lower lobectomy with associated volume loss in the left hemithorax.  Suspected wedge resection in the medial left lung apex (series 7/image 15).  Associated paramediastinal radiation changes.  Multiple bilateral pulmonary nodules, including: --3 mm nodule the left lung apex (series 7/image 11), unchanged --4 mm nodule in the anterior left upper lobe (series 7/image 20), unchanged --12 x 11 mm right upper lobe nodule (series 7/image 21), previously 12 x 10 mm --4 mm nodule in the lateral right upper lobe (series 7/image 22), unchanged --9 mm nodule in the central left lower lung (series 7/image 33), previously 8 mm --6 mm nodule in the left lower lung (series 7/image 34), unchanged  Trace left pleural effusion.  No pneumothorax.  The heart is normal in size.  Trace pericardial fluid.  No suspicious mediastinal, hilar, or axillary lymphadenopathy.  Visualized upper abdomen is unremarkable.  Degenerative changes of the thoracic spine.  IMPRESSION:  Status post left lower lobectomy with radiation changes in the left hemithorax.  Multiple bilateral pulmonary nodules, as described above, suspicious for metastases but grossly unchanged.  Dominant right upper lobe nodule measures 12 x 11 mm, previously 12 x 10 mm.   Original Report Authenticated By: Charline Bills, M.D.    ASSESSMENT AND PLAN: this is a very pleasant 67 years old African American female with metastatic non-small cell lung cancer adenocarcinoma and has been on treatment with oral Tarceva for almost 6 years with no evidence for disease progression. I discussed the scan results with the patient. I recommended for her to continue treatment with Tarceva with the same dose. The patient would come back for follow up visit in 3 months with repeat CT of the chest, abdomen and pelvis. She was advised to call immediately if she has any concerning symptoms in the interval.  The patient voices understanding of current disease status and treatment options and is in agreement with the current care plan.  All questions were answered. The patient knows to call the clinic with any problems, questions or concerns. We can certainly see the patient much sooner if necessary.  I spent 15 minutes counseling the patient face to face. The total time spent in the appointment was 25 minutes.

## 2013-04-09 DIAGNOSIS — C3490 Malignant neoplasm of unspecified part of unspecified bronchus or lung: Secondary | ICD-10-CM | POA: Diagnosis not present

## 2013-04-09 DIAGNOSIS — Z01419 Encounter for gynecological examination (general) (routine) without abnormal findings: Secondary | ICD-10-CM | POA: Diagnosis not present

## 2013-04-09 DIAGNOSIS — M810 Age-related osteoporosis without current pathological fracture: Secondary | ICD-10-CM | POA: Diagnosis not present

## 2013-04-23 ENCOUNTER — Other Ambulatory Visit: Payer: Self-pay | Admitting: Otolaryngology

## 2013-04-23 ENCOUNTER — Other Ambulatory Visit (HOSPITAL_COMMUNITY)
Admission: RE | Admit: 2013-04-23 | Discharge: 2013-04-23 | Disposition: A | Discharge status: Home / Self Care | Payer: Medicare Other | Source: Ambulatory Visit | Attending: Otolaryngology | Admitting: Otolaryngology

## 2013-04-23 DIAGNOSIS — R220 Localized swelling, mass and lump, head: Secondary | ICD-10-CM | POA: Diagnosis not present

## 2013-04-23 DIAGNOSIS — R599 Enlarged lymph nodes, unspecified: Secondary | ICD-10-CM | POA: Diagnosis not present

## 2013-04-23 DIAGNOSIS — C760 Malignant neoplasm of head, face and neck: Secondary | ICD-10-CM | POA: Diagnosis not present

## 2013-04-24 ENCOUNTER — Encounter: Payer: Self-pay | Admitting: *Deleted

## 2013-04-24 NOTE — Progress Notes (Signed)
RECEIVED A FAX FROM [facility] CONCERNING A CONFIRMATION FOR PRESCRIPTION SHIPMENT FOR TARCEVA ON 04/23/13.

## 2013-04-28 ENCOUNTER — Other Ambulatory Visit (HOSPITAL_COMMUNITY): Payer: Self-pay | Admitting: Otolaryngology

## 2013-04-28 DIAGNOSIS — C3490 Malignant neoplasm of unspecified part of unspecified bronchus or lung: Secondary | ICD-10-CM

## 2013-05-04 ENCOUNTER — Other Ambulatory Visit: Payer: Self-pay | Admitting: Medical Oncology

## 2013-05-04 DIAGNOSIS — C3490 Malignant neoplasm of unspecified part of unspecified bronchus or lung: Secondary | ICD-10-CM

## 2013-05-04 NOTE — Progress Notes (Signed)
Pt requests flu vaccine . She is on tarceva. I scheduled appt and sent note to Dr Arbutus Ped.

## 2013-05-05 ENCOUNTER — Ambulatory Visit (HOSPITAL_BASED_OUTPATIENT_CLINIC_OR_DEPARTMENT_OTHER): Payer: Medicare Other

## 2013-05-05 ENCOUNTER — Encounter (HOSPITAL_COMMUNITY): Payer: Self-pay

## 2013-05-05 ENCOUNTER — Encounter (HOSPITAL_COMMUNITY)
Admission: RE | Admit: 2013-05-05 | Discharge: 2013-05-05 | Disposition: A | Discharge status: Home / Self Care | Payer: Medicare Other | Source: Ambulatory Visit | Attending: Otolaryngology | Admitting: Otolaryngology

## 2013-05-05 VITALS — BP 140/74 | HR 76 | Temp 97.8°F

## 2013-05-05 DIAGNOSIS — Z23 Encounter for immunization: Secondary | ICD-10-CM

## 2013-05-05 DIAGNOSIS — C3490 Malignant neoplasm of unspecified part of unspecified bronchus or lung: Secondary | ICD-10-CM | POA: Insufficient documentation

## 2013-05-05 DIAGNOSIS — C801 Malignant (primary) neoplasm, unspecified: Secondary | ICD-10-CM | POA: Diagnosis not present

## 2013-05-05 LAB — GLUCOSE, CAPILLARY: Glucose-Capillary: 92 mg/dL (ref 70–99)

## 2013-05-05 MED ORDER — FLUDEOXYGLUCOSE F - 18 (FDG) INJECTION
18.3000 | Freq: Once | INTRAVENOUS | Status: AC | PRN
  Administered 2013-05-05: 18.3 via INTRAVENOUS

## 2013-05-05 MED ORDER — INFLUENZA VAC SPLIT QUAD 0.5 ML IM SUSP
0.5000 mL | Freq: Once | INTRAMUSCULAR | Status: AC
  Administered 2013-05-05: 0.5 mL via INTRAMUSCULAR
  Filled 2013-05-05: qty 0.5

## 2013-05-11 ENCOUNTER — Ambulatory Visit
Admission: RE | Admit: 2013-05-11 | Discharge: 2013-05-11 | Disposition: A | Discharge status: Home / Self Care | Payer: Medicare Other | Source: Ambulatory Visit

## 2013-05-11 DIAGNOSIS — Z1231 Encounter for screening mammogram for malignant neoplasm of breast: Secondary | ICD-10-CM

## 2013-05-12 IMAGING — CT CT CHEST W/ CM
1 of 3 series · 15 of 32 positions shown, 19 images · IV contrast ([ID] [id number])
Comparison: No CT 11/30/2010.

CT CHEST

CLINICAL DATA: Lung cancer with brain metastasis.  Chemotherapy
complete 4004.  Radiation gamma knife 1664.  Prior left lower
lobectomy.

CT CHEST, ABDOMEN AND PELVIS WITH CONTRAST
TECHNIQUE: Multidetector CT imaging of the chest, abdomen and
pelvis was performed following the standard protocol during bolus
administration of intravenous contrast.
Contrast: 125 ml Omnipaque 300

[Series 2: cap with st · axial · 0.84mm/px · z∈[-676,-76]mm · 15 of 138 slices shown, 19 images]
[im 9/138  mediastinal]
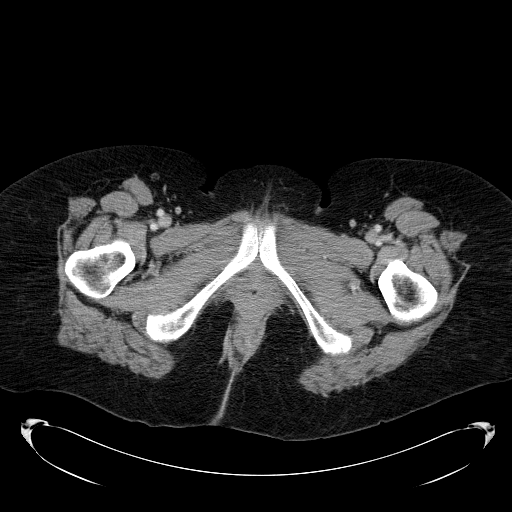
[im 9/138  lung]
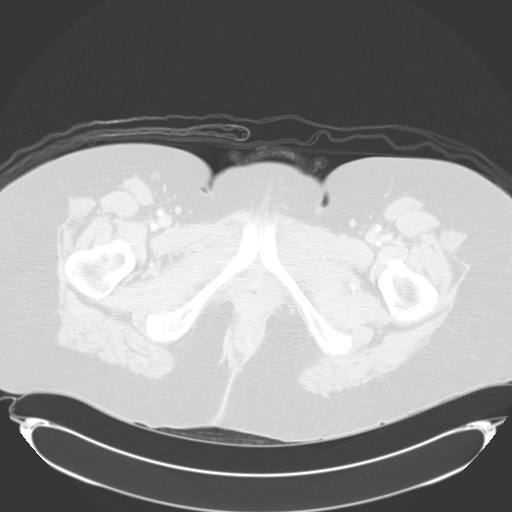
[im 18/138  lung]
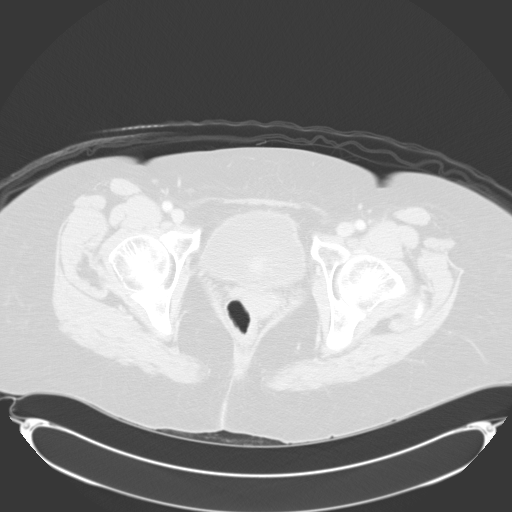
[im 35/138  lung]
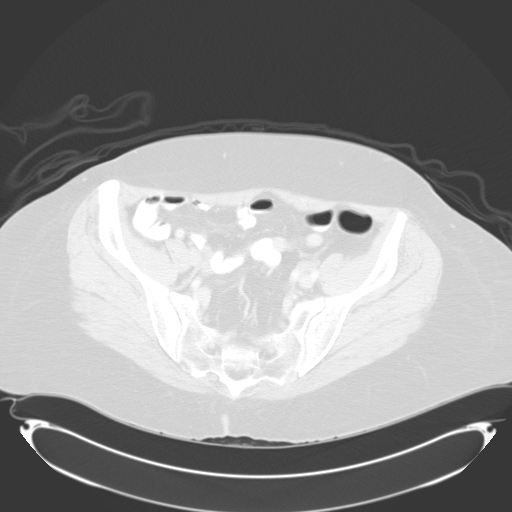
[im 43/138  lung]
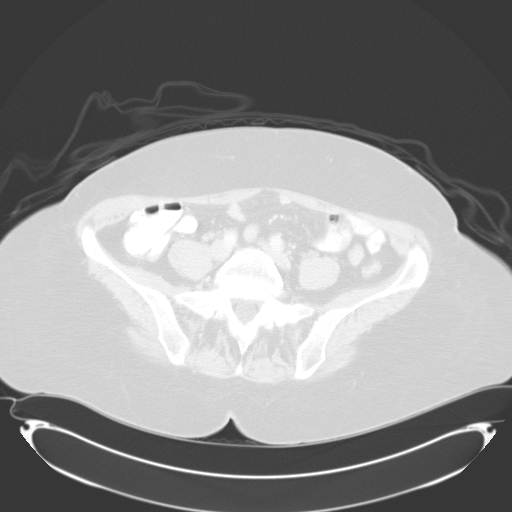
[im 46/138  mediastinal]
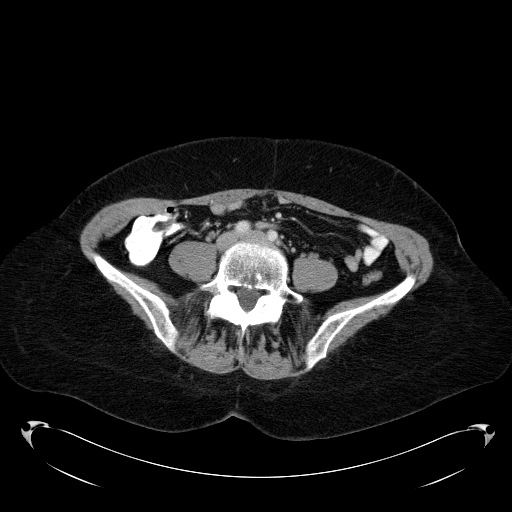
[im 46/138  lung]
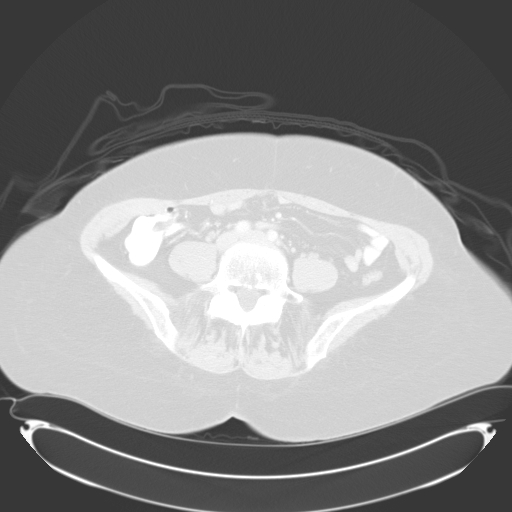
[im 52/138  lung]
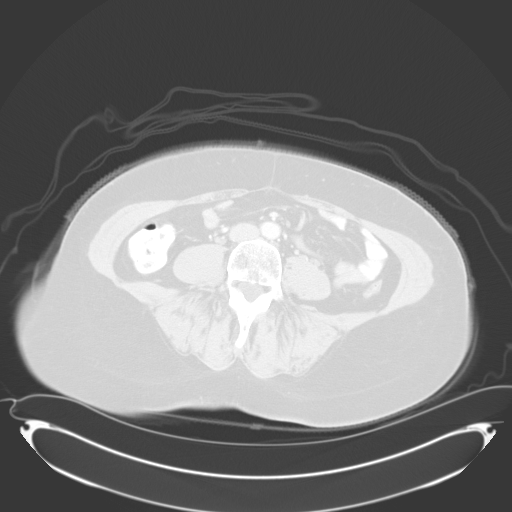
[im 60/138  lung]
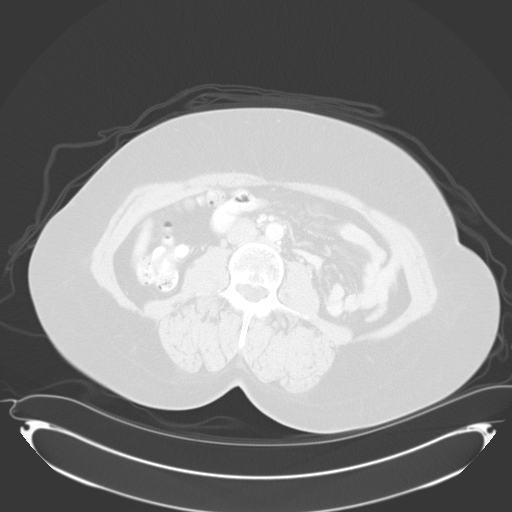
[im 69/138  lung]
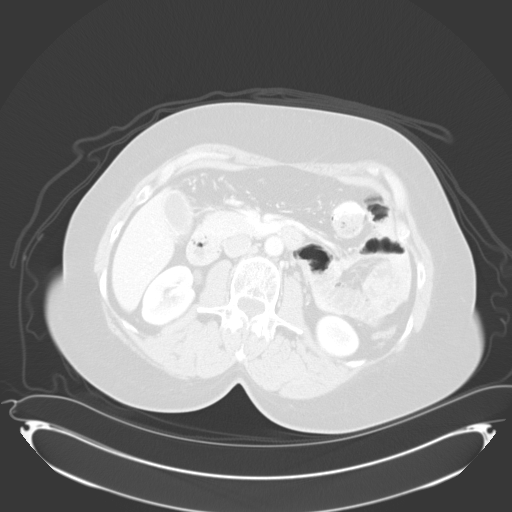
[im 78/138  mediastinal]
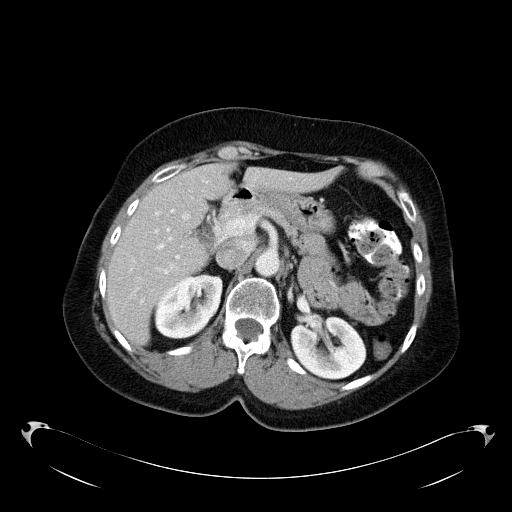
[im 78/138  lung]
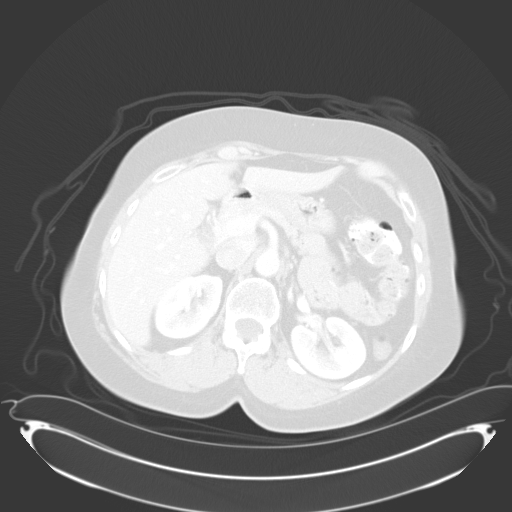
[im 86/138  lung]
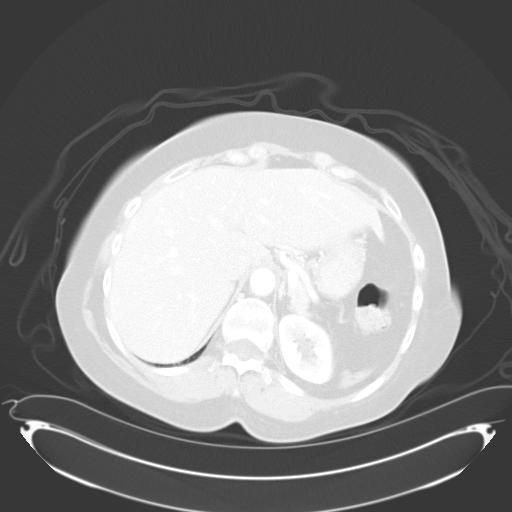
[im 92/138  lung]
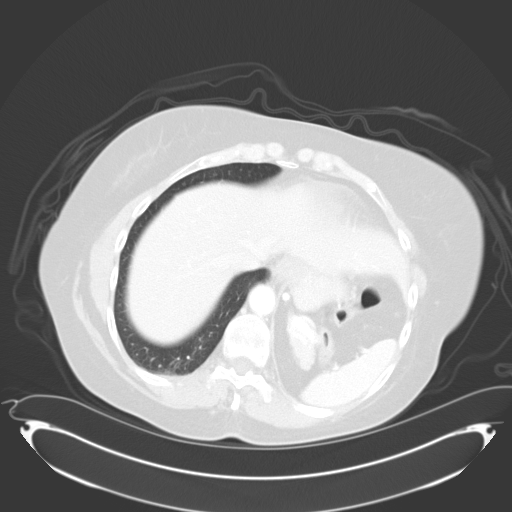
[im 95/138  lung]
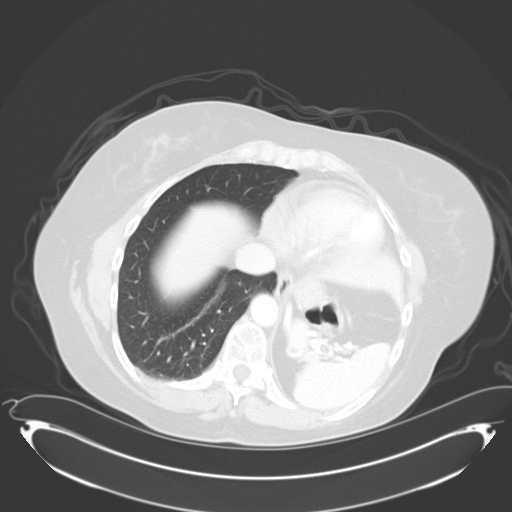
[im 112/138  mediastinal]
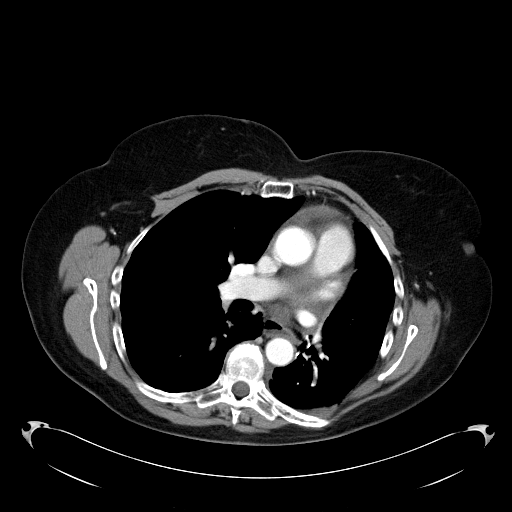
[im 112/138  lung]
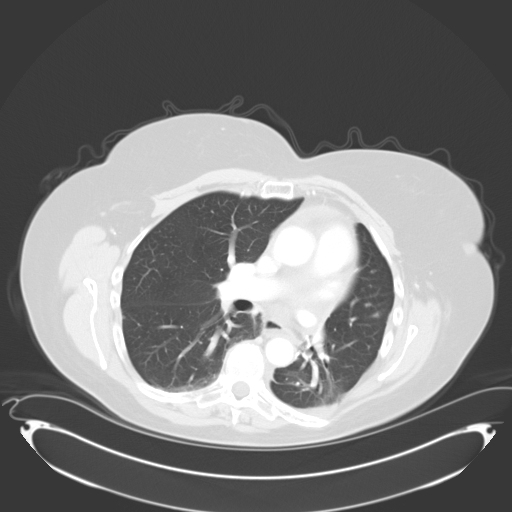
[im 120/138  lung]
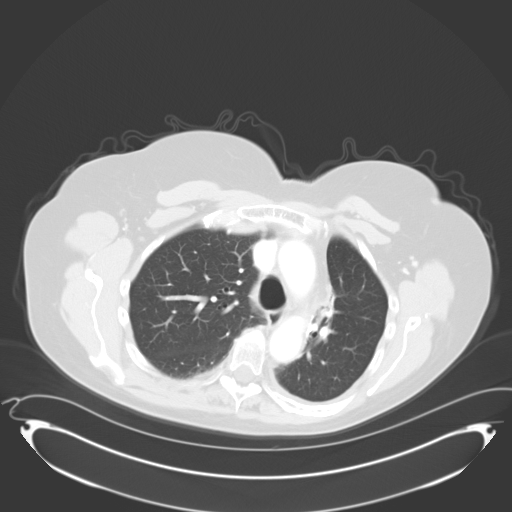
[im 129/138  lung]
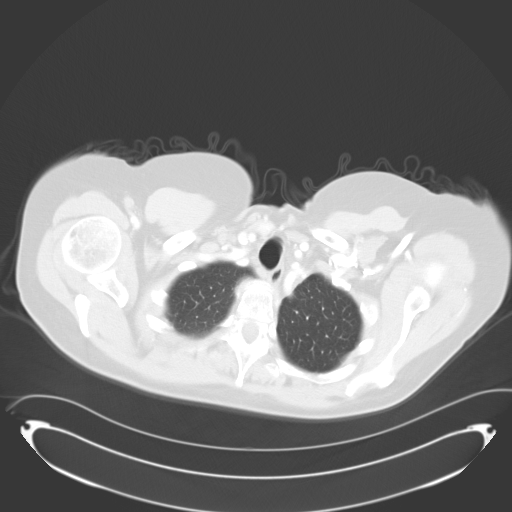

[15 of 32 positions shown; findings below may reference images not displayed]

FINDINGS: No axillary supraclavicular lymphadenopathy.  No
mediastinal or hilar lymphadenopathy.

There is postsurgical change in the left hemithorax with  left
upper lobectomy.  No evidence of new nodularity along the resection
site.  Thoracotomy defect in the left lateral chest wall.  No
suspicious nodules within the pulmonary parenchyma.
IMPRESSION: 1.  No evidence of lung cancer recurrence or metastasis.
2.  Stable postoperative change in the left hemithorax.

CT ABDOMEN AND PELVIS
FINDINGS: A tiny hypodensity in the left hepatic lobe is
unchanged.  No enhancing hepatic lesion.  The gallbladder,
pancreas, spleen, adrenal glands, and kidneys are normal.

The stomach, small bowel, and colon are normal.

Abdominal aorta normal caliber.  No retroperitoneal
lymphadenopathy.

No free fluid the pelvis.  The bladder is normal.  Leiomyoma within
the right uterine fundus is unchanged.  No pelvic lymphadenopathy.
Ovaries are small. Review of  bone windows demonstrates no
aggressive osseous lesions.
IMPRESSION: 1.  No evidence of metastasis in the abdomen or pelvis.
2.  Fibroid uterus.

## 2013-05-15 ENCOUNTER — Other Ambulatory Visit: Payer: Self-pay | Admitting: Otolaryngology

## 2013-05-15 DIAGNOSIS — C50919 Malignant neoplasm of unspecified site of unspecified female breast: Secondary | ICD-10-CM | POA: Diagnosis not present

## 2013-05-15 DIAGNOSIS — C770 Secondary and unspecified malignant neoplasm of lymph nodes of head, face and neck: Secondary | ICD-10-CM | POA: Diagnosis not present

## 2013-05-15 DIAGNOSIS — C3490 Malignant neoplasm of unspecified part of unspecified bronchus or lung: Secondary | ICD-10-CM | POA: Diagnosis not present

## 2013-05-25 ENCOUNTER — Encounter: Payer: Self-pay | Admitting: *Deleted

## 2013-05-25 NOTE — Progress Notes (Signed)
RECEIVED A FAX FROM [facility] CONCERNING A CONFIRMATION OF PRESCRIPTION SHIPMENT FOR TARCEVA ON 05/22/13.

## 2013-06-04 ENCOUNTER — Telehealth: Payer: Self-pay | Admitting: *Deleted

## 2013-06-04 NOTE — Telephone Encounter (Signed)
Pt called requesting a letter to be written for her to be excused from jury duty.  Letter written, signed by Dr Donnald Garre, and faxed to Attn: Jury Summons [phone], and a copy was also faxed to her husband per pt request [phone].  SLJ

## 2013-06-22 ENCOUNTER — Other Ambulatory Visit: Payer: Self-pay | Admitting: *Deleted

## 2013-06-22 ENCOUNTER — Telehealth: Payer: Self-pay | Admitting: *Deleted

## 2013-06-22 DIAGNOSIS — C3490 Malignant neoplasm of unspecified part of unspecified bronchus or lung: Secondary | ICD-10-CM

## 2013-06-22 NOTE — Telephone Encounter (Signed)
THIS REFILL REQUEST FOR TARCEVA WAS GIVEN TO DR.MOHAMED'S STEPHANIE JOHNSON,RN.

## 2013-06-22 NOTE — Telephone Encounter (Signed)
Pt called stating that she had a full body PET scan 9/16 and she wanted to know if she really needed a CT scan to be done this month for Dr Donnald Garre.  She is also requesting an MRI of the brain.  Per dr Donnald Garre, okay to cancel the CT scan and okay to schedule an MRI of the brain to be done before next f/u visit.  SLJ

## 2013-06-24 ENCOUNTER — Other Ambulatory Visit: Payer: Self-pay | Admitting: *Deleted

## 2013-06-24 ENCOUNTER — Telehealth: Payer: Self-pay | Admitting: *Deleted

## 2013-06-24 ENCOUNTER — Telehealth: Payer: Self-pay | Admitting: Internal Medicine

## 2013-06-24 NOTE — Telephone Encounter (Signed)
Lm informed the pt that on 07/01/13 her CT was cancel. gv appt for 11/12 @ 7:30pm for MRI. i also madwe the pt aware that her labs were moved to 07/02/13 @ 9:30am right before her ov....td

## 2013-06-24 NOTE — Telephone Encounter (Signed)
Received confirmation of receiving referral from [facility].

## 2013-06-24 NOTE — Telephone Encounter (Signed)
Received message from central scheduling that pt cx'd her ct scan appt but is still scheduled for mri. Message forwarded to MM ([facility]).

## 2013-06-30 NOTE — Telephone Encounter (Signed)
RECEIVED A FAX FROM [facility] CONCERNING A CONFIRMATION OF PRESCRIPTION SHIPMENT FOR TARCEVA ON 06/29/13.

## 2013-07-01 ENCOUNTER — Other Ambulatory Visit (HOSPITAL_COMMUNITY): Payer: Medicare Other

## 2013-07-01 ENCOUNTER — Other Ambulatory Visit: Payer: Medicare Other | Admitting: Lab

## 2013-07-01 ENCOUNTER — Ambulatory Visit (HOSPITAL_COMMUNITY): Payer: Medicare Other

## 2013-07-01 ENCOUNTER — Ambulatory Visit
Admission: RE | Admit: 2013-07-01 | Discharge: 2013-07-01 | Disposition: A | Discharge status: Home / Self Care | Payer: Medicare Other | Source: Ambulatory Visit | Attending: Internal Medicine | Admitting: Internal Medicine

## 2013-07-01 DIAGNOSIS — C3490 Malignant neoplasm of unspecified part of unspecified bronchus or lung: Secondary | ICD-10-CM

## 2013-07-01 DIAGNOSIS — G9389 Other specified disorders of brain: Secondary | ICD-10-CM | POA: Diagnosis not present

## 2013-07-01 MED ORDER — GADOBENATE DIMEGLUMINE 529 MG/ML IV SOLN
19.0000 mL | Freq: Once | INTRAVENOUS | Status: AC | PRN
  Administered 2013-07-01: 19 mL via INTRAVENOUS

## 2013-07-02 ENCOUNTER — Ambulatory Visit (HOSPITAL_BASED_OUTPATIENT_CLINIC_OR_DEPARTMENT_OTHER): Payer: Medicare Other | Admitting: Internal Medicine

## 2013-07-02 ENCOUNTER — Encounter: Payer: Self-pay | Admitting: *Deleted

## 2013-07-02 ENCOUNTER — Other Ambulatory Visit (HOSPITAL_BASED_OUTPATIENT_CLINIC_OR_DEPARTMENT_OTHER): Payer: Medicare Other | Admitting: Lab

## 2013-07-02 ENCOUNTER — Encounter: Payer: Self-pay | Admitting: Internal Medicine

## 2013-07-02 VITALS — BP 120/69 | HR 98 | Temp 97.2°F | Resp 18 | Ht 69.0 in | Wt 202.3 lb

## 2013-07-02 DIAGNOSIS — C3490 Malignant neoplasm of unspecified part of unspecified bronchus or lung: Secondary | ICD-10-CM

## 2013-07-02 DIAGNOSIS — C7931 Secondary malignant neoplasm of brain: Secondary | ICD-10-CM | POA: Diagnosis not present

## 2013-07-02 DIAGNOSIS — C3430 Malignant neoplasm of lower lobe, unspecified bronchus or lung: Secondary | ICD-10-CM | POA: Diagnosis not present

## 2013-07-02 DIAGNOSIS — R5381 Other malaise: Secondary | ICD-10-CM

## 2013-07-02 DIAGNOSIS — R918 Other nonspecific abnormal finding of lung field: Secondary | ICD-10-CM

## 2013-07-02 DIAGNOSIS — C7949 Secondary malignant neoplasm of other parts of nervous system: Secondary | ICD-10-CM

## 2013-07-02 LAB — CBC WITH DIFFERENTIAL/PLATELET
BASO%: 0.7 % (ref 0.0–2.0)
Basophils Absolute: 0 10*3/uL (ref 0.0–0.1)
EOS%: 5.6 % (ref 0.0–7.0)
Eosinophils Absolute: 0.3 10*3/uL (ref 0.0–0.5)
HCT: 41.2 % (ref 34.8–46.6)
HGB: 13.3 g/dL (ref 11.6–15.9)
LYMPH%: 14.3 % (ref 14.0–49.7)
MCH: 30.1 pg (ref 25.1–34.0)
MCHC: 32.4 g/dL (ref 31.5–36.0)
MCV: 93 fL (ref 79.5–101.0)
MONO#: 0.4 10*3/uL (ref 0.1–0.9)
MONO%: 7.2 % (ref 0.0–14.0)
NEUT#: 4 10*3/uL (ref 1.5–6.5)
NEUT%: 72.2 % (ref 38.4–76.8)
Platelets: 228 10*3/uL (ref 145–400)
RBC: 4.44 10*6/uL (ref 3.70–5.45)
RDW: 14.3 % (ref 11.2–14.5)
WBC: 5.6 10*3/uL (ref 3.9–10.3)
lymph#: 0.8 10*3/uL — ABNORMAL LOW (ref 0.9–3.3)

## 2013-07-02 LAB — COMPREHENSIVE METABOLIC PANEL (CC13)
ALT: 14 U/L (ref 0–55)
AST: 14 U/L (ref 5–34)
Albumin: 4 g/dL (ref 3.5–5.0)
Alkaline Phosphatase: 71 U/L (ref 40–150)
Anion Gap: 10 mEq/L (ref 3–11)
BUN: 11.3 mg/dL (ref 7.0–26.0)
CO2: 26 mEq/L (ref 22–29)
Calcium: 9.9 mg/dL (ref 8.4–10.4)
Chloride: 109 mEq/L (ref 98–109)
Creatinine: 0.8 mg/dL (ref 0.6–1.1)
Glucose: 66 mg/dl — ABNORMAL LOW (ref 70–140)
Potassium: 3.4 mEq/L — ABNORMAL LOW (ref 3.5–5.1)
Sodium: 146 mEq/L — ABNORMAL HIGH (ref 136–145)
Total Bilirubin: 1.17 mg/dL (ref 0.20–1.20)
Total Protein: 7 g/dL (ref 6.4–8.3)

## 2013-07-02 NOTE — Progress Notes (Signed)
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   PRIOR THERAPY:  1. Status post left lower lobectomy under the care of Dr Edwyna Shell in October 2002. 2. Status post course of concurrent chemoradiation with weekly carboplatin and paclitaxel under the care of Dr Truett Perna and Dr Dorna Bloom in early 2003. 3. Status post treatment with Celebrex and Iressa according to the clinical trial at [facility] for a total of 1 year. 4. Status post left occipital craniotomy for tumor resection on September 21, 2005, under the care of Dr Franky Macho. 5. Status post whole brain irradiation under the care of Dr Dorna Bloom, completed November 07, 2005. 6. Status post gamma knife treatment to the resected cavitary recurrence on June 02, 2008, at [facility]. 7. Status post right thyroidectomy with radical neck dissection under the care of Dr. Pollyann Kennedy on 10/18/2011. 8. Status post excisional biopsy of right cervical lymph node that was consistent with metastatic adenocarcinoma.  CURRENT THERAPY: Tarceva 150 mg by mouth daily with therapy beginning 06/03/2007.  CHEMOTHERAPY INTENT: palliative  CURRENT # OF CHEMOTHERAPY CYCLES: 73  CURRENT ANTIEMETICS: Compazine when necessary  CURRENT SMOKING STATUS: nonsmoker  ORAL CHEMOTHERAPY AND CONSENT: Tarceva  CURRENT BISPHOSPHONATES USE: None  PAIN MANAGEMENT: None  NARCOTICS INDUCED CONSTIPATION: None  LIVING WILL AND CODE STATUS: Full code  INTERVAL HISTORY: Valerie [name] 67 y.o. female returns to the clinic today for three-month follow up visit. The patient is feeling fine today with no specific complaints except for increasing fatigue. She denied having any significant chest pain, shortness of  breath, cough or hemoptysis. The patient denied having any nausea or vomiting. She had no weight loss or night sweats. She is tolerating her treatment with Tarceva fairly well with no significant skin rash but occasional episodes of diarrhea. She recently underwent excisional biopsy of right cervical lymph nodes under the care of Dr. Pollyann Kennedy and the final pathology was consistent with metastatic adenocarcinoma. The patient had repeat a PET scan performed and September of 2014 in addition to her recent MRI of the brain and she is here for evaluation and discussion of her imaging results.  MEDICAL HISTORY: Past Medical History  Diagnosis Date  . Heart murmur   . Shortness of breath     hx lung ca   . Arthritis   . Lung cancer dx'd 2002  . Metastasis to brain dx'd 2008  . 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 Anal fistula   . H/O osteoporosis   . H/O vitamin D deficiency   . History of measles, mumps, or rubella   . H/O varicella   . Hypertension   . Yeast infection   . Abnormal Pap smear 2006  . Atrophic vaginitis 2008  . Dyspareunia 2008  . Dementia 2009  . Osteoporosis 2010  . Status post chemotherapy 2003    CARBOPLATIN/PACLITAXEL /STATUS POST CLINICAL TRAIL OF CELEBREX AND IRESSA AT [facility] FOR 1 YEAR  . Status post radiation therapy 2003    LEFT LUNG  . Status post radiation therapy 11/07/2005    WHOLE BRAIN: DR Jill Alexanders [facility]  . Status post radiation therapy 06/02/2008    GAMMA KNIFE OF RESECTED CAVITAY  . On antineoplastic chemotherapy     TARCEVA  . Nodule of  right lung CT- 06/03/12    RIGHT UPPER LOBE  . Thyroid adenoma     ?  [name] Thyroid cancer 10/18/11 bx    adenoid nodules   . Anxiety   . Cataract   . Nodule of right lung 06/03/12    Upper Lobe    ALLERGIES:  has No Known Allergies.  MEDICATIONS:  Current Outpatient Prescriptions  Medication Sig Dispense Refill  . alendronate (FOSAMAX) 70 MG tablet Take 1 tablet (70 mg total) by mouth every 7 (seven) days.  Saturdays; Take with a full glass of water on an empty stomach.  4 tablet  12  . Ascorbic Acid (VITAMIN C PO) Take 1 tablet by mouth daily. Takes twice a wek      . aspirin EC 81 MG tablet Take 81 mg by mouth every other day.       . Cholecalciferol (VITAMIN D3) 2000 UNITS TABS Take 2,000 Int'l Units by mouth every other day.       . donepezil (ARICEPT) 10 MG tablet Take 1 tablet (10 mg total) by mouth daily.  90 tablet  0  . erlotinib (TARCEVA) 150 MG tablet Take 1 tablet (150 mg total) by mouth daily. Take on an empty stomach 1 hour before meals or 2 hours after.  30 tablet  2  . loperamide (IMODIUM) 2 MG capsule Take 2 mg by mouth 4 (four) times daily as needed for diarrhea or loose stools.      . Multiple Vitamin (MULITIVITAMIN WITH MINERALS) TABS Take 1 tablet by mouth daily.      . naphazoline-pheniramine (NAPHCON-A) 0.025-0.3 % ophthalmic solution Place 1 drop into both eyes 4 (four) times daily as needed. For dry eyes       No current facility-administered medications for this visit.    SURGICAL HISTORY:  Past Surgical History  Procedure Laterality Date  . Left occipital craniotomy  09/21/2005    tumor  . Left lower lobectomy  05/2001  . Radical neck dissection  10/18/2011    Procedure: RADICAL NECK DISSECTION;  Surgeon: Serena Colonel, MD;  Location: [facility] OR;  Service: ENT;  Laterality: Right;  RIGHT MODIFIED NECK DISSECTION /POSSIBLE RIGHT THYROIDECTOM  . Thyroidectomy  10/18/2011    Procedure: THYROIDECTOMY WITH RADICAL NECK DISSECTION;  Surgeon: Serena Colonel, MD;  Location: [facility] OR;  Service: ENT;  Laterality: Right;    REVIEW OF SYSTEMS:  Constitutional: negative Eyes: negative Ears, nose, mouth, throat, and face: negative Respiratory: negative Cardiovascular: negative Gastrointestinal: negative Genitourinary:negative Integument/breast: negative Hematologic/lymphatic: negative Musculoskeletal:negative Neurological: negative Behavioral/Psych: negative Endocrine:  negative Allergic/Immunologic: negative   PHYSICAL EXAMINATION: General appearance: alert, cooperative, fatigued and no distress Head: Normocephalic, without obvious abnormality, atraumatic Neck: no adenopathy Lymph nodes: Cervical, supraclavicular, and axillary nodes normal. Resp: clear to auscultation bilaterally Cardio: regular rate and rhythm, S1, S2 normal, no murmur, click, rub or gallop GI: soft, non-tender; bowel sounds normal; no masses,  no organomegaly Extremities: extremities normal, atraumatic, no cyanosis or edema Neurologic: Alert and oriented X 3, normal strength and tone. Normal symmetric reflexes. Normal coordination and gait  ECOG PERFORMANCE STATUS: 1 - Symptomatic but completely ambulatory  Blood pressure 120/69, pulse 98, temperature 97.2 F (36.2 C), temperature source Oral, resp. rate 18, height 5\' 9"  (1.753 m), weight 202 lb 4.8 oz (91.763 kg).  LABORATORY DATA: Lab Results  Component Value Date   WBC 5.6 07/02/2013   HGB 13.3 07/02/2013   HCT 41.2 07/02/2013   MCV 93.0 07/02/2013   PLT 228 07/02/2013  Chemistry      Component Value Date/Time   NA 146* 07/02/2013 0928   NA 138 02/28/2012 0939   NA 140 01/23/2012 0941   K 3.4* 07/02/2013 0928   K 4.6 02/28/2012 0939   K 3.6 01/23/2012 0941   CL 106 12/09/2012 0806   CL 99 02/28/2012 0939   CL 105 01/23/2012 0941   CO2 26 07/02/2013 0928   CO2 28 02/28/2012 0939   CO2 26 01/23/2012 0941   BUN 11.3 07/02/2013 0928   BUN 12 02/28/2012 0939   BUN 11 01/23/2012 0941   CREATININE 0.8 07/02/2013 0928   CREATININE 1.0 02/28/2012 0939   CREATININE 0.83 01/23/2012 0941      Component Value Date/Time   CALCIUM 9.9 07/02/2013 0928   CALCIUM 9.1 02/28/2012 0939   CALCIUM 9.5 01/23/2012 0941   ALKPHOS 71 07/02/2013 0928   ALKPHOS 80 02/28/2012 0939   ALKPHOS 72 01/23/2012 0941   AST 14 07/02/2013 0928   AST 20 02/28/2012 0939   AST 17 01/23/2012 0941   ALT 14 07/02/2013 0928   ALT 21 02/28/2012 0939   ALT 17 01/23/2012  0941   BILITOT 1.17 07/02/2013 0928   BILITOT 1.30 02/28/2012 0939   BILITOT 1.3* 01/23/2012 0941       RADIOGRAPHIC STUDIES: NUCLEAR MEDICINE PET SKULL BASE TO THIGH  FASTING BLOOD GLUCOSE: Value: 92 mg/dl  TECHNIQUE:  65.7 next healed mCi F-18 FDG was injected intravenously. CT data  was obtained and used for attenuation correction and anatomic  localization only. (This was not acquired as a diagnostic CT  examination.) Additional exam technical data entered on technologist  worksheet.  COMPARISON: CT thorax 04/01/2013, 12/09/2012 PET-CT scan 08/29/2011  FINDINGS:  NECK  Physiologic hypermetabolic activity associated with the  submandibular glands and left parotid gland. The right parotid gland  is been removed. Physiologic hypermetabolic activity associated the  glottis. There is a hypermetabolic activity associated with the left  lobe of thyroid gland following right hemithyroidectomy likely  related to thyroiditis.  There is a hypermetabolic lymph node within the right neck (level V)  superficial to the scalene muscle measuring 11 mm (image 52) with  SUV max = max 5.7.  CHEST  Hypermetabolic right upper lobe pulmonary nodule measuring 13 mm  (image 71) with SUV max = 4.3. Sub cm left upper lobe nodule  measures 4 mm (image g69). This is too small to characterize by PET.  9 mm nodule at the left lung base (image 89) does not have  associated metabolic activity. There are no hypermetabolic  mediastinal lymph nodes.  ABDOMEN/PELVIS  No abnormal hypermetabolic activity within the liver, pancreas,  adrenal glands, or spleen. No hypermetabolic lymph nodes in the  abdomen or pelvis.  SKELETON  No focal hypermetabolic activity to suggest skeletal metastasis.  IMPRESSION:  1. Hypermetabolic right upper lobe pulmonary nodule consistent with  pulmonary metastasis.  2. Smaller Left lung nodules are not hypermetabolic but remain  suspicious.  3. Hypermetabolic right level V lymph  node consistent with nodal  metastasis.  4. No evidence of distant metastasis.  5. Activity associated with the left lobe of thyroid gland is likely  physiologic or related to thyroiditis     Mr Laqueta Jean Wo Contrast  07/02/2013   CLINICAL DATA:  History of lung cancer and thyroid cancer, history of brain surgery for metastasis in 2007.  EXAM: MRI HEAD WITHOUT AND WITH CONTRAST  TECHNIQUE: Multiplanar, multiecho pulse sequences of the brain and surrounding  structures were obtained without and with intravenous contrast.  CONTRAST:  19mL MULTIHANCE GADOBENATE DIMEGLUMINE 529 MG/ML IV SOLN  COMPARISON:  CT of the head September 04, 2012 and MRI of the brain August 20, 2011  FINDINGS: Status post left occipital craniotomy as previously seen with mild underlying encephalomalacia within the left occipital lobe, mild ex vacuo dilatation of the left occipital horn, unchanged. 5 mm bilobed nodular enhancement along the posterior margin of the resection cavity is unchanged. Similar confluent supratentorial white matter T2 hyperintensities, without midline shift or mass effect. Subcentimeter patchy right cerebellar T2 hyperintensities are similar. The ventricles and sulci are normal for patient's age. No reduced diffusion to suggest acute ischemia. Tiny focus of susceptibility artifact in the right cerebellum now present. Similar right temporal focus of susceptibility artifact, new within the left posterior frontal lobe, with additional punctate foci of susceptibility artifact which are favored to reflect vessel flow voids. No new suspicious parenchymal enhancement, mass lesions nor mass effect.  No abnormal extra-axial fluid collections, leptomeningeal enhancement nor extra-axial masses. Normal major intracranial vascular flow voids observed at the skull base.  Minimal secretions layering in the nasopharynx. Mild mucosal thickening of ethmoid air cells without paranasal sinus air-fluid levels. Mastoid air cells appear  well-aerated. Severe temporomandibular osteoarthrosis. Ocular globes and orbital contents are nonsuspicious. No abnormal sellar expansion. Craniocervical junction maintained.  IMPRESSION: Remote left occipital craniotomy, with similar appearance of the subjacent resection cavity including 5 mm nodular enhancement favored to reflect postoperative change. No new evidence of disease progression/ new metastasis.  Similar severe white matter changes suggest post treatment related changes, in a background of small vessel ischemic disease.  A few scattered micro hemorrhages, increased from prior examination which may reflect sequelae of hypertension, and are unlikely to reflect treated metastasis.   Electronically Signed   By: Awilda Metro   On: 07/02/2013 02:42   ASSESSMENT AND PLAN: this is a very pleasant 67 years old African American female with metastatic non-small cell lung cancer adenocarcinoma and has been on treatment with oral Tarceva for more than 6 years with no evidence for disease progression. I discussed the scan results with the patient. Her recent PET scan showed evidence for increase in the size of the right upper lobe lung nodule in addition to the right cervical lymph node that was removed by Dr. Pollyann Kennedy. I recommended for the patient to see Dr. Mitzi Hansen for consideration of stereotactic radiotherapy to the right upper lobe lung nodule as the only site of disease progression in the chest. I recommended for her to continue treatment with Tarceva with the same dose. I will send the tissue from the right cervical lymph node biopsy to be tested for molecular biomarkers by [facility] if there is sufficient material for testing. She would come back for follow up visit in one month's for reevaluation and discussion of her treatment options based on the biomarkers results. She was advised to call immediately if she has any concerning symptoms in the interval.  The patient voices understanding of  current disease status and treatment options and is in agreement with the current care plan.  All questions were answered. The patient knows to call the clinic with any problems, questions or concerns. We can certainly see the patient much sooner if necessary.  I spent 15 minutes counseling the patient face to face. The total time spent in the appointment was 25 minutes.

## 2013-07-02 NOTE — Progress Notes (Signed)
[facility] request form sent per Dr. Arbutus Ped with confirmation fax received.

## 2013-07-03 ENCOUNTER — Telehealth: Payer: Self-pay | Admitting: Internal Medicine

## 2013-07-03 NOTE — Telephone Encounter (Signed)
S/w the pt and she is aware of her dec 2014 appts for lab and md@10 :15am.

## 2013-07-04 NOTE — Patient Instructions (Signed)
Referral to Dr. Mitzi Hansen for consideration of stereotactic radiotherapy to the enlarging right upper lobe lung nodule. Follow up visit in one month

## 2013-07-06 ENCOUNTER — Encounter: Payer: Self-pay | Admitting: *Deleted

## 2013-07-06 NOTE — Progress Notes (Signed)
Faxed request for [facility] to [facility] per Dr. Asa Lente request

## 2013-07-07 DIAGNOSIS — C3490 Malignant neoplasm of unspecified part of unspecified bronchus or lung: Secondary | ICD-10-CM | POA: Diagnosis not present

## 2013-07-08 ENCOUNTER — Encounter: Payer: Self-pay | Admitting: Radiation Oncology

## 2013-07-08 DIAGNOSIS — C7800 Secondary malignant neoplasm of unspecified lung: Secondary | ICD-10-CM | POA: Insufficient documentation

## 2013-07-08 NOTE — Progress Notes (Signed)
Thoracic Location of Tumor / Histology: RUL lung nodule per PET scan 05/05/13  Patient presented 1 months ago with symptoms of: RUL lung nodule per PET scan  Biopsies of right neck (if applicable) revealed:  05/15/13 Diagnosis Soft tissue mass, simple excision, right neck - POSITIVE FOR METASTATIC ADENOCARCINOMA CONSISTENT WITH LUNG PRIMARY SOURCE. - SEE COMMENT.  Tobacco/Marijuana/Snuff/ETOH use: smoked few years in college per note 06/19/2001  Past/Anticipated interventions by cardiothoracic surgery, if any: LLL lobectomy 05/2001  Past/Anticipated interventions by medical oncology, if any:  Status post course of concurrent chemoradiation with weekly carboplatin and paclitaxel under the care of Dr Truett Perna and Dr Dorna Bloom in early 2003. Status post treatment with Celebrex and Iressa according to the clinical trial at [facility] for a total of 1 year. ORAL CHEMOTHERAPY AND CONSENT: Tarceva   Signs/Symptoms  Weight changes, if any: none  Respiratory complaints, if any: SOB w/climbing stairs  Hemoptysis, if any: none  Pain issues, if any:  none  SAFETY ISSUES: Prior radiation? Yes, Status post course of concurrent chemoradiation with weekly carboplatin and paclitaxel under the care of Dr Truett Perna and Dr Dorna Bloom in early 2003. Status post left occipital craniotomy for tumor resection on September 21, 2005, under the care of Dr Franky Macho. Status post whole brain irradiation under the care of Dr Dorna Bloom, completed November 07, 2005. Status post gamma knife treatment to the resected cavitary recurrence on June 02, 2008, at [facility].    Pacemaker/ICD? no  Possible current pregnancy? no  Is the patient on methotrexate? no  Current Complaints / other details:  Retired Runner, broadcasting/film/video, 2 daughters, married. Pt denies pain, fatigue, loss of appetite, cough. SOB only w/climbing stairs. Seen by Dr Basilio Cairo 06/27/12 for metastatic lung cancer to RUL nodule. Pt was asymptomatic at that time.

## 2013-07-09 ENCOUNTER — Encounter: Payer: Self-pay | Admitting: *Deleted

## 2013-07-09 ENCOUNTER — Ambulatory Visit
Admission: RE | Admit: 2013-07-09 | Discharge: 2013-07-09 | Disposition: A | Discharge status: Home / Self Care | Payer: Medicare Other | Source: Ambulatory Visit | Attending: Radiation Oncology | Admitting: Radiation Oncology

## 2013-07-09 ENCOUNTER — Encounter: Payer: Self-pay | Admitting: Radiation Oncology

## 2013-07-09 VITALS — BP 113/64 | HR 98 | Temp 97.6°F | Resp 20 | Wt 200.0 lb

## 2013-07-09 DIAGNOSIS — C770 Secondary and unspecified malignant neoplasm of lymph nodes of head, face and neck: Secondary | ICD-10-CM | POA: Diagnosis not present

## 2013-07-09 DIAGNOSIS — C7801 Secondary malignant neoplasm of right lung: Secondary | ICD-10-CM

## 2013-07-09 DIAGNOSIS — C3490 Malignant neoplasm of unspecified part of unspecified bronchus or lung: Secondary | ICD-10-CM | POA: Insufficient documentation

## 2013-07-09 DIAGNOSIS — C7800 Secondary malignant neoplasm of unspecified lung: Secondary | ICD-10-CM | POA: Diagnosis not present

## 2013-07-09 HISTORY — DX: Secondary malignant neoplasm of unspecified lung: C78.00

## 2013-07-09 NOTE — Progress Notes (Signed)
Faxed received from [facility].  Expected results for pt on 07/26/13.  Placed fax on Dr. Asa Lente desk

## 2013-07-09 NOTE — Progress Notes (Signed)
[facility] Radiation Oncology Follow up Note  Name: Valerie [name]   Date:   07/09/2013 MRN:  [id number] DOB: [birth year]   CC:  [name], MD  Si Gaul, MD  DIAGNOSIS: Metastatic non-small cell carcinoma, solitary right lung metastasis    INTERVAL SINCE LAST RADIATION: 7 years and 8 months to the whole brain, and almost 12 years to the left chest/mediastinum. Also history of gamma knife radiosurgery at [facility] in October 2009    ALLERGIES: Review of patient's allergies indicates no known allergies.   MEDICATIONS:  Current Outpatient Prescriptions  Medication Sig Dispense Refill  . alendronate (FOSAMAX) 70 MG tablet Take 1 tablet (70 mg total) by mouth every 7 (seven) days. Saturdays; Take with a full glass of water on an empty stomach.  4 tablet  12  . Ascorbic Acid (VITAMIN C PO) Take 1 tablet by mouth daily. Takes twice a wek      . aspirin EC 81 MG tablet Take 81 mg by mouth every other day.       . Cholecalciferol (VITAMIN D3) 2000 UNITS TABS Take 2,000 Int'l Units by mouth every other day.       . donepezil (ARICEPT) 10 MG tablet Take 1 tablet (10 mg total) by mouth daily.  90 tablet  0  . erlotinib (TARCEVA) 150 MG tablet Take 1 tablet (150 mg total) by mouth daily. Take on an empty stomach 1 hour before meals or 2 hours after.  30 tablet  2  . loperamide (IMODIUM) 2 MG capsule Take 2 mg by mouth 4 (four) times daily as needed for diarrhea or loose stools.      . Multiple Vitamin (MULITIVITAMIN WITH MINERALS) TABS Take 1 tablet by mouth daily.      . naphazoline-pheniramine (NAPHCON-A) 0.025-0.3 % ophthalmic solution Place 1 drop into both eyes 4 (four) times daily as needed. For dry eyes       No current facility-administered medications for this encounter.     NARRATIVE: Ms. Valerie [name] is a most pleasant 67 year old female who is seen today to request Dr. Arbutus Ped for consideration of radiosurgery to her right lung in the  management of her metastatic non-small cell carcinoma of left lung with a solitary right lung metastasis. She was diagnosed initially with stage IIB adenocarcinoma in October 2002. She underwent left lower lobectomy under the care of Dr. Edwyna Shell in October 2002. She then underwent concurrent chemoradiation with weekly carboplatin and paclitaxel under the care of Dr Truett Perna and Dr Dorna Bloom - she received a total dose of 61.2 gray to the chest . She then received treatment with Celebrex and Iressa according to the clinical trial at [facility] for a total of 1 year. Subsequently she underwent left occipital craniotomy for tumor resection on September 21, 2005, under the care of Dr Franky Macho. She received whole brain irradiation under the care of Dr Dorna Bloom, completed November 07, 2005, total dose 36 gray to the whole brain followed by 4 gray boost. She is status post gamma knife treatment to a resected cavitary recurrence on June 02, 2008, at [facility]. She underwent right thyroidectomy with radical neck dissection under the care of Dr. Pollyann Kennedy on 10/18/2011. The thyroid gland revealed an adenoma, follicular. One of 6 lymph nodes from the right neck dissection revealed metastatic adenocarcinoma consistent with lung primary. She will onto receive Tarceva beginning in October of 2008. She was followed by serial CT scans was then had a right upper  lobe central nodule which is slowly progressed in size. Her most recent PET scan from 05/05/2013 showed a hypermetabolic 1.3 cm nodule within the right upper lobe consistent with a metastatic deposit. There were smaller left lower lobe nodules, not hypermetabolic but somewhat suspicious. She had a hypermetabolic right level V lymph node which was excised by Dr. Pollyann Kennedy and this was consistent with metastatic adenocarcinoma. Thus her only known active disease is a metastatic deposit to her right lung. She still doing well but she does have dyspnea on exertion.   PHYSICAL  EXAM:   weight is 200 lb (90.719 kg). Her oral temperature is 97.6 F (36.4 C). Her blood pressure is 113/64 and her pulse is 98. Her respiration is 20 and oxygen saturation is 100%.  Alert and oriented 67 year old [place] American female appearing younger than her stated age. Head and neck examination: Grossly unremarkable. Nodes: Without palpable cervical or supraclavicular lymphadenopathy. Chest: She is kyphotic. There is a thoracotomy scar along her left posterior lateral chest. Diminished breath sounds along left mid lower lung zone. Right lung is clear. Heart: Regular rate and rhythm. Abdomen: Without hepatomegaly. Extremities: Without edema.   LABORATORY DATA:  Lab Results  Component Value Date   WBC 5.6 07/02/2013   HGB 13.3 07/02/2013   HCT 41.2 07/02/2013   MCV 93.0 07/02/2013   PLT 228 07/02/2013   Lab Results  Component Value Date   NA 146* 07/02/2013   K 3.4* 07/02/2013   CL 106 12/09/2012   CO2 26 07/02/2013   Lab Results  Component Value Date   ALT 14 07/02/2013   AST 14 07/02/2013   ALKPHOS 71 07/02/2013   BILITOT 1.17 07/02/2013       IMPRESSION: Metastatic non-small cell carcinoma (adenocarcinoma) with a solitary metastasis to her right lung. This is her only site of active disease, even though she may have a few small metastases within the left lung. She has obviously done remarkably well since her diagnosis in October 2002. She is a candidate for radiosurgery to her right lung metastasis. In view of the proximal location she would require 5 treatments to deliver 5000 cGy. I discussed the potential acute and late toxicities of radiation therapy and she wishes to proceed as outlined. Consent is signed today.    PLAN: As discussed above. She will return next week for simulation/treatment planning.   I spent 45 minutes minutes face to face with the patient and more than 50% of that time was spent in counseling and/or coordination of care.

## 2013-07-09 NOTE — Progress Notes (Signed)
Please see the Nurse Progress Note in the MD Initial Consult Encounter for this patient. 

## 2013-07-14 ENCOUNTER — Ambulatory Visit: Payer: Medicare Other

## 2013-07-14 ENCOUNTER — Ambulatory Visit
Admission: RE | Admit: 2013-07-14 | Discharge: 2013-07-14 | Disposition: A | Discharge status: Home / Self Care | Payer: Medicare Other | Source: Ambulatory Visit | Attending: Radiation Oncology | Admitting: Radiation Oncology

## 2013-07-14 ENCOUNTER — Ambulatory Visit: Payer: Medicare Other | Admitting: Radiation Oncology

## 2013-07-14 DIAGNOSIS — C7801 Secondary malignant neoplasm of right lung: Secondary | ICD-10-CM

## 2013-07-14 DIAGNOSIS — C7931 Secondary malignant neoplasm of brain: Secondary | ICD-10-CM | POA: Diagnosis not present

## 2013-07-14 DIAGNOSIS — C3490 Malignant neoplasm of unspecified part of unspecified bronchus or lung: Secondary | ICD-10-CM | POA: Diagnosis not present

## 2013-07-14 DIAGNOSIS — C7800 Secondary malignant neoplasm of unspecified lung: Secondary | ICD-10-CM | POA: Diagnosis not present

## 2013-07-14 DIAGNOSIS — Z510 Encounter for antineoplastic radiation therapy: Secondary | ICD-10-CM | POA: Insufficient documentation

## 2013-07-14 DIAGNOSIS — C7949 Secondary malignant neoplasm of other parts of nervous system: Secondary | ICD-10-CM | POA: Insufficient documentation

## 2013-07-14 NOTE — Progress Notes (Addendum)
Complex simulation/treatment planning note: The patient was taken to the CT simulator. A body fix immobilization bag was constructed an abdominal compression was utilized. A 4D CT scan was performed. I contoured the ITV in the MIP image set. I then reviewed the contours in the 4D image set. The ITV was expanded uniformly by 0.5 cm to create PTV 50. I prescribing 5000 cGy in 5 sessions utilizing 6 MV photons. The patient now ready for treatment planning.

## 2013-07-20 ENCOUNTER — Encounter: Payer: Self-pay | Admitting: *Deleted

## 2013-07-20 NOTE — Progress Notes (Signed)
Checked [facility] secure website per Dr. Asa Lente request.  Pt's second gen results completed and printed.  Given to Dr. Arbutus Ped.

## 2013-07-22 ENCOUNTER — Encounter: Payer: Self-pay | Admitting: Radiation Oncology

## 2013-07-22 DIAGNOSIS — C3490 Malignant neoplasm of unspecified part of unspecified bronchus or lung: Secondary | ICD-10-CM | POA: Diagnosis not present

## 2013-07-22 DIAGNOSIS — C7931 Secondary malignant neoplasm of brain: Secondary | ICD-10-CM | POA: Diagnosis not present

## 2013-07-22 DIAGNOSIS — Z510 Encounter for antineoplastic radiation therapy: Secondary | ICD-10-CM | POA: Diagnosis not present

## 2013-07-22 DIAGNOSIS — C7800 Secondary malignant neoplasm of unspecified lung: Secondary | ICD-10-CM | POA: Diagnosis not present

## 2013-07-22 NOTE — Progress Notes (Signed)
  Radiation Oncology         [phone] ________________________________  Name: Valerie [name] MRN: [id number]  Date: 07/22/2013  DOB: [birth year]  RESPIRATORY MOTION MANAGEMENT SIMULATION  NARRATIVE:  In order to account for effect of respiratory motion on target structures and other organs in the planning and delivery of radiotherapy, this patient underwent respiratory motion management simulation.  To accomplish this, when the patient was brought to the CT simulation planning suite, 4D respiratoy motion management CT images were obtained.  The CT images were loaded into the planning software.  Then, using a variety of tools including Cine, MIP, and standard views, the target volume and planning target volumes (PTV) were delineated.  Avoidance structures were contoured.  Treatment planning then occurred.  Dose volume histograms were generated and reviewed for each of the requested structure.  The resulting plan was carefully reviewed and approved today.  3-D simulation note: The patient completed treatment planning for her scheduled SBRT to her right lung. He carefully reviewing her previous treatment from 2002, and the target GTV was clearly outside the previous radiation therapy field. Therefore received less than 50% of the prescribed dose. After contouring the tracheobronchial tree we noted that there would be overlap with the target PTV, and thus decided to proceed with 10 fractions of daily radiation therapy rather than 55 fractions of every other day as SBRT. We met our departmental goals, and I prescribing 5000 cGy in 10 sessions. I spoke with the patient this afternoon to explain to her that we are giving 10 fractions of hyperfractionated treatment instead of traditional SBRT. I explained her that there is some concern about the dose going to the proximal bronchi, and the risk for a bronchial complication is expected to be low but not negligible. There is probably some degree of repair of  sublethal damage over the past 12 years as well. She'll be treated with 2 modulated arcs, thus 2 complex treatment devices are employed. I requesting daily cone beam CT, setting up to her ITV and bronchial tree. I also explained to the patient that the alternative treatment would be to continue with her Tarceva. She understands and wishes to proceed with radiation therapy. We're planning to start early next week.

## 2013-07-22 NOTE — Progress Notes (Signed)
Special treatment procedure note: The patient required extensive planning including review her previous radiation therapy for her current SBRT planning. This was labor intensive and constitutes a special treatment procedure.

## 2013-07-27 ENCOUNTER — Ambulatory Visit: Payer: Medicare Other | Admitting: Radiation Oncology

## 2013-07-28 ENCOUNTER — Ambulatory Visit
Admission: RE | Admit: 2013-07-28 | Discharge: 2013-07-28 | Disposition: A | Discharge status: Home / Self Care | Payer: Medicare Other | Source: Ambulatory Visit | Attending: Radiation Oncology | Admitting: Radiation Oncology

## 2013-07-28 ENCOUNTER — Ambulatory Visit: Payer: Medicare Other | Admitting: Radiation Oncology

## 2013-07-28 DIAGNOSIS — C7800 Secondary malignant neoplasm of unspecified lung: Secondary | ICD-10-CM | POA: Diagnosis not present

## 2013-07-28 DIAGNOSIS — Z510 Encounter for antineoplastic radiation therapy: Secondary | ICD-10-CM | POA: Diagnosis not present

## 2013-07-28 DIAGNOSIS — C7931 Secondary malignant neoplasm of brain: Secondary | ICD-10-CM | POA: Diagnosis not present

## 2013-07-28 DIAGNOSIS — C3490 Malignant neoplasm of unspecified part of unspecified bronchus or lung: Secondary | ICD-10-CM | POA: Diagnosis not present

## 2013-07-29 ENCOUNTER — Encounter (HOSPITAL_COMMUNITY): Payer: Self-pay

## 2013-07-29 ENCOUNTER — Ambulatory Visit
Admission: RE | Admit: 2013-07-29 | Discharge: 2013-07-29 | Disposition: A | Discharge status: Home / Self Care | Payer: Medicare Other | Source: Ambulatory Visit | Attending: Radiation Oncology | Admitting: Radiation Oncology

## 2013-07-29 ENCOUNTER — Ambulatory Visit: Payer: Medicare Other | Admitting: Radiation Oncology

## 2013-07-29 DIAGNOSIS — C3490 Malignant neoplasm of unspecified part of unspecified bronchus or lung: Secondary | ICD-10-CM | POA: Diagnosis not present

## 2013-07-29 DIAGNOSIS — Z510 Encounter for antineoplastic radiation therapy: Secondary | ICD-10-CM | POA: Diagnosis not present

## 2013-07-29 DIAGNOSIS — C7931 Secondary malignant neoplasm of brain: Secondary | ICD-10-CM | POA: Diagnosis not present

## 2013-07-29 DIAGNOSIS — C7800 Secondary malignant neoplasm of unspecified lung: Secondary | ICD-10-CM | POA: Diagnosis not present

## 2013-07-30 ENCOUNTER — Ambulatory Visit: Payer: Medicare Other | Admitting: Radiation Oncology

## 2013-07-30 ENCOUNTER — Ambulatory Visit
Admission: RE | Admit: 2013-07-30 | Discharge: 2013-07-30 | Disposition: A | Discharge status: Home / Self Care | Payer: Medicare Other | Source: Ambulatory Visit | Attending: Radiation Oncology | Admitting: Radiation Oncology

## 2013-07-30 DIAGNOSIS — C7931 Secondary malignant neoplasm of brain: Secondary | ICD-10-CM | POA: Diagnosis not present

## 2013-07-30 DIAGNOSIS — C7800 Secondary malignant neoplasm of unspecified lung: Secondary | ICD-10-CM | POA: Diagnosis not present

## 2013-07-30 DIAGNOSIS — C3490 Malignant neoplasm of unspecified part of unspecified bronchus or lung: Secondary | ICD-10-CM | POA: Diagnosis not present

## 2013-07-30 DIAGNOSIS — Z510 Encounter for antineoplastic radiation therapy: Secondary | ICD-10-CM | POA: Diagnosis not present

## 2013-07-31 ENCOUNTER — Ambulatory Visit
Admission: RE | Admit: 2013-07-31 | Discharge: 2013-07-31 | Disposition: A | Discharge status: Home / Self Care | Payer: Medicare Other | Source: Ambulatory Visit | Attending: Radiation Oncology | Admitting: Radiation Oncology

## 2013-07-31 ENCOUNTER — Ambulatory Visit: Payer: Medicare Other | Admitting: Radiation Oncology

## 2013-07-31 DIAGNOSIS — C7800 Secondary malignant neoplasm of unspecified lung: Secondary | ICD-10-CM | POA: Diagnosis not present

## 2013-07-31 DIAGNOSIS — Z510 Encounter for antineoplastic radiation therapy: Secondary | ICD-10-CM | POA: Diagnosis not present

## 2013-07-31 DIAGNOSIS — C7931 Secondary malignant neoplasm of brain: Secondary | ICD-10-CM | POA: Diagnosis not present

## 2013-07-31 DIAGNOSIS — C3490 Malignant neoplasm of unspecified part of unspecified bronchus or lung: Secondary | ICD-10-CM | POA: Diagnosis not present

## 2013-08-03 ENCOUNTER — Ambulatory Visit: Payer: Medicare Other | Admitting: Internal Medicine

## 2013-08-03 ENCOUNTER — Ambulatory Visit
Admission: RE | Admit: 2013-08-03 | Discharge: 2013-08-03 | Disposition: A | Discharge status: Home / Self Care | Payer: Medicare Other | Source: Ambulatory Visit | Attending: Radiation Oncology | Admitting: Radiation Oncology

## 2013-08-03 DIAGNOSIS — Z510 Encounter for antineoplastic radiation therapy: Secondary | ICD-10-CM | POA: Diagnosis not present

## 2013-08-03 DIAGNOSIS — C7800 Secondary malignant neoplasm of unspecified lung: Secondary | ICD-10-CM | POA: Diagnosis not present

## 2013-08-03 DIAGNOSIS — C3490 Malignant neoplasm of unspecified part of unspecified bronchus or lung: Secondary | ICD-10-CM | POA: Diagnosis not present

## 2013-08-03 DIAGNOSIS — C7931 Secondary malignant neoplasm of brain: Secondary | ICD-10-CM | POA: Diagnosis not present

## 2013-08-03 NOTE — Progress Notes (Signed)
Weekly Management Note:  Site: Right lung Current Dose:  2500  cGy Projected Dose: 5000  cGy  Narrative: The patient is seen today for routine under treatment assessment. CBCT/MVCT images/port films were reviewed. The chart was reviewed.   She is without complaints today. Her setup is excellent.  Physical Examination: There were no vitals filed for this visit..  Weight:  . No change.  Impression: Tolerating radiation therapy well. She has one more week of radiation therapy.  Plan: Continue radiation therapy as planned.

## 2013-08-04 ENCOUNTER — Other Ambulatory Visit: Payer: Self-pay | Admitting: *Deleted

## 2013-08-04 ENCOUNTER — Ambulatory Visit: Payer: Medicare Other | Admitting: Radiation Oncology

## 2013-08-04 ENCOUNTER — Other Ambulatory Visit (HOSPITAL_BASED_OUTPATIENT_CLINIC_OR_DEPARTMENT_OTHER): Payer: Medicare Other

## 2013-08-04 ENCOUNTER — Encounter: Payer: Self-pay | Admitting: Internal Medicine

## 2013-08-04 ENCOUNTER — Ambulatory Visit
Admission: RE | Admit: 2013-08-04 | Discharge: 2013-08-04 | Disposition: A | Discharge status: Home / Self Care | Payer: Medicare Other | Source: Ambulatory Visit | Attending: Radiation Oncology | Admitting: Radiation Oncology

## 2013-08-04 ENCOUNTER — Ambulatory Visit (HOSPITAL_BASED_OUTPATIENT_CLINIC_OR_DEPARTMENT_OTHER): Payer: Medicare Other | Admitting: Internal Medicine

## 2013-08-04 VITALS — BP 124/79 | HR 93 | Temp 97.6°F | Resp 18 | Ht 69.0 in | Wt 200.4 lb

## 2013-08-04 DIAGNOSIS — C3490 Malignant neoplasm of unspecified part of unspecified bronchus or lung: Secondary | ICD-10-CM | POA: Diagnosis not present

## 2013-08-04 DIAGNOSIS — C3430 Malignant neoplasm of lower lobe, unspecified bronchus or lung: Secondary | ICD-10-CM | POA: Diagnosis not present

## 2013-08-04 DIAGNOSIS — M810 Age-related osteoporosis without current pathological fracture: Secondary | ICD-10-CM

## 2013-08-04 DIAGNOSIS — Z510 Encounter for antineoplastic radiation therapy: Secondary | ICD-10-CM | POA: Diagnosis not present

## 2013-08-04 DIAGNOSIS — C7949 Secondary malignant neoplasm of other parts of nervous system: Secondary | ICD-10-CM

## 2013-08-04 DIAGNOSIS — I10 Essential (primary) hypertension: Secondary | ICD-10-CM

## 2013-08-04 DIAGNOSIS — C7931 Secondary malignant neoplasm of brain: Secondary | ICD-10-CM | POA: Diagnosis not present

## 2013-08-04 DIAGNOSIS — C7800 Secondary malignant neoplasm of unspecified lung: Secondary | ICD-10-CM | POA: Diagnosis not present

## 2013-08-04 LAB — COMPREHENSIVE METABOLIC PANEL (CC13)
ALT: 15 U/L (ref 0–55)
AST: 14 U/L (ref 5–34)
Albumin: 4 g/dL (ref 3.5–5.0)
Alkaline Phosphatase: 74 U/L (ref 40–150)
Anion Gap: 9 mEq/L (ref 3–11)
BUN: 13 mg/dL (ref 7.0–26.0)
CO2: 27 mEq/L (ref 22–29)
Calcium: 10 mg/dL (ref 8.4–10.4)
Chloride: 108 mEq/L (ref 98–109)
Creatinine: 0.8 mg/dL (ref 0.6–1.1)
Glucose: 77 mg/dl (ref 70–140)
Potassium: 4 mEq/L (ref 3.5–5.1)
Sodium: 144 mEq/L (ref 136–145)
Total Bilirubin: 1.16 mg/dL (ref 0.20–1.20)
Total Protein: 7 g/dL (ref 6.4–8.3)

## 2013-08-04 LAB — CBC WITH DIFFERENTIAL/PLATELET
BASO%: 0.5 % (ref 0.0–2.0)
Basophils Absolute: 0 10*3/uL (ref 0.0–0.1)
EOS%: 5.3 % (ref 0.0–7.0)
Eosinophils Absolute: 0.2 10*3/uL (ref 0.0–0.5)
HCT: 42.4 % (ref 34.8–46.6)
HGB: 13.8 g/dL (ref 11.6–15.9)
LYMPH%: 14.3 % (ref 14.0–49.7)
MCH: 30.4 pg (ref 25.1–34.0)
MCHC: 32.6 g/dL (ref 31.5–36.0)
MCV: 93.3 fL (ref 79.5–101.0)
MONO#: 0.3 10*3/uL (ref 0.1–0.9)
MONO%: 7.7 % (ref 0.0–14.0)
NEUT#: 3.3 10*3/uL (ref 1.5–6.5)
NEUT%: 72.2 % (ref 38.4–76.8)
Platelets: 233 10*3/uL (ref 145–400)
RBC: 4.55 10*6/uL (ref 3.70–5.45)
RDW: 14.2 % (ref 11.2–14.5)
WBC: 4.5 10*3/uL (ref 3.9–10.3)
lymph#: 0.6 10*3/uL — ABNORMAL LOW (ref 0.9–3.3)

## 2013-08-04 MED ORDER — ERLOTINIB HCL 150 MG PO TABS: 150.0000 mg | ORAL_TABLET | Freq: Every day | ORAL | Status: DC

## 2013-08-04 NOTE — Progress Notes (Signed)
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  BIOMARKERS TESTING ([facility]): Positive for: EGFR E746-A750 del, T790M, CTNNB1 D32G, SMAD4 T139fs*10 Negative for: RET, ALK, BRAF, KRAS, ERBB2 and MET  PRIOR THERAPY:  1. Status post left lower lobectomy under the care of Dr Edwyna Shell in October 2002. 2. Status post course of concurrent chemoradiation with weekly carboplatin and paclitaxel under the care of Dr Truett Perna and Dr Dorna Bloom in early 2003. 3. Status post treatment with Celebrex and Iressa according to the clinical trial at [facility] for a total of 1 year. 4. Status post left occipital craniotomy for tumor resection on September 21, 2005, under the care of Dr Franky Macho. 5. Status post whole brain irradiation under the care of Dr Dorna Bloom, completed November 07, 2005. 6. Status post gamma knife treatment to the resected cavitary recurrence on June 02, 2008, at [facility]. 7. Status post right thyroidectomy with radical neck dissection under the care of Dr. Pollyann Kennedy on 10/18/2011. 8. Status post excisional biopsy of right cervical lymph node that was consistent with metastatic adenocarcinoma.  CURRENT THERAPY:  1) Tarceva 150 mg by mouth daily with therapy beginning 06/03/2007. 2) palliative radiotherapy to the enlarging right upper lobe lung nodule under the care of Dr. Dayton Scrape.  CHEMOTHERAPY INTENT: palliative  CURRENT # OF CHEMOTHERAPY CYCLES: 74  CURRENT ANTIEMETICS: Compazine when necessary  CURRENT SMOKING STATUS: nonsmoker  ORAL CHEMOTHERAPY AND CONSENT: Tarceva  CURRENT BISPHOSPHONATES USE: None  PAIN MANAGEMENT: None  NARCOTICS INDUCED CONSTIPATION: None  LIVING WILL AND CODE STATUS: Full  code  INTERVAL HISTORY: Valerie [name] 67 y.o. female returns to the clinic today for three-month follow up visit. The patient is feeling fine today with no specific complaints except for increasing fatigue. She is tolerating her current treatment with palliative radiotherapy to the right upper lobe lung nodule fairly well. She denied having any significant chest pain, shortness of breath, cough or hemoptysis. The patient denied having any nausea or vomiting. She had no weight loss or night sweats. She is tolerating her treatment with Tarceva fairly well with no significant skin rash but occasional episodes of diarrhea. The tissue block from the excisional biopsy of the right cervical lymph node was sent to [facility] for [facility] testing and it showed positive EGFR mutation in exon 19 in addition to resistant mutation T790M. the patient is here today for evaluation and discussion of her treatment options.   MEDICAL HISTORY: Past Medical History  Diagnosis Date  . Heart murmur   . Shortness of breath     hx lung ca   . Arthritis   . Lung cancer dx'd 2002  . Metastasis to brain dx'd 2008  . 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 Anal fistula   . H/O osteoporosis   . H/O vitamin D deficiency   . History of measles, mumps, or rubella   . H/O varicella   . Hypertension   . Yeast infection   . Abnormal Pap smear 2006  . Atrophic vaginitis 2008  . Dyspareunia 2008  . Dementia 2009  . Osteoporosis 2010  . Status post chemotherapy 2003    CARBOPLATIN/PACLITAXEL /STATUS POST CLINICAL TRAIL OF CELEBREX AND IRESSA AT [facility] FOR 1 YEAR  . Status post  radiation therapy 2003    LEFT LUNG  . Status post radiation therapy 11/07/2005    WHOLE BRAIN: DR Jill Alexanders [facility]  . Status post radiation therapy 06/02/2008    GAMMA KNIFE OF RESECTED CAVITAY  . On antineoplastic chemotherapy     TARCEVA  . Nodule of right lung CT- 06/03/12    RIGHT UPPER LOBE  . Thyroid adenoma     ?  [name] Thyroid  cancer 10/18/11 bx    adenoid nodules   . Anxiety   . Cataract   . Nodule of right lung 06/03/12    Upper Lobe  . Lung metastasis     PET scan 05/05/13, RUL lung nodule    ALLERGIES:  has No Known Allergies.  MEDICATIONS:  Current Outpatient Prescriptions  Medication Sig Dispense Refill  . alendronate (FOSAMAX) 70 MG tablet Take 1 tablet (70 mg total) by mouth every 7 (seven) days. Saturdays; Take with a full glass of water on an empty stomach.  4 tablet  12  . Ascorbic Acid (VITAMIN C PO) Take 1 tablet by mouth daily. Takes twice a wek      . aspirin EC 81 MG tablet Take 81 mg by mouth every other day.       . Cholecalciferol (VITAMIN D3) 2000 UNITS TABS Take 2,000 Int'l Units by mouth every other day.       . erlotinib (TARCEVA) 150 MG tablet Take 1 tablet (150 mg total) by mouth daily. Take on an empty stomach 1 hour before meals or 2 hours after.  30 tablet  2  . loperamide (IMODIUM) 2 MG capsule Take 2 mg by mouth 4 (four) times daily as needed for diarrhea or loose stools.      . Multiple Vitamin (MULITIVITAMIN WITH MINERALS) TABS Take 1 tablet by mouth daily.      . naphazoline-pheniramine (NAPHCON-A) 0.025-0.3 % ophthalmic solution Place 1 drop into both eyes 4 (four) times daily as needed. For dry eyes      . donepezil (ARICEPT) 10 MG tablet Take 1 tablet (10 mg total) by mouth daily.  90 tablet  0   No current facility-administered medications for this visit.    SURGICAL HISTORY:  Past Surgical History  Procedure Laterality Date  . Left occipital craniotomy  09/21/2005    tumor  . Left lower lobectomy  05/2001  . Radical neck dissection  10/18/2011    Procedure: RADICAL NECK DISSECTION;  Surgeon: Serena Colonel, MD;  Location: [facility] OR;  Service: ENT;  Laterality: Right;  RIGHT MODIFIED NECK DISSECTION /POSSIBLE RIGHT THYROIDECTOM  . Thyroidectomy  10/18/2011    Procedure: THYROIDECTOMY WITH RADICAL NECK DISSECTION;  Surgeon: Serena Colonel, MD;  Location: [facility] OR;  Service: ENT;   Laterality: Right;    REVIEW OF SYSTEMS:  Constitutional: negative Eyes: negative Ears, nose, mouth, throat, and face: negative Respiratory: negative Cardiovascular: negative Gastrointestinal: negative Genitourinary:negative Integument/breast: negative Hematologic/lymphatic: negative Musculoskeletal:negative Neurological: negative Behavioral/Psych: negative Endocrine: negative Allergic/Immunologic: negative   PHYSICAL EXAMINATION: General appearance: alert, cooperative, fatigued and no distress Head: Normocephalic, without obvious abnormality, atraumatic Neck: no adenopathy Lymph nodes: Cervical, supraclavicular, and axillary nodes normal. Resp: clear to auscultation bilaterally Cardio: regular rate and rhythm, S1, S2 normal, no murmur, click, rub or gallop GI: soft, non-tender; bowel sounds normal; no masses,  no organomegaly Extremities: extremities normal, atraumatic, no cyanosis or edema Neurologic: Alert and oriented X 3, normal strength and tone. Normal symmetric reflexes. Normal coordination and gait  ECOG PERFORMANCE STATUS: 1 - Symptomatic  but completely ambulatory  Blood pressure 124/79, pulse 93, temperature 97.6 F (36.4 C), temperature source Oral, resp. rate 18, height 5\' 9"  (1.753 m), weight 200 lb 6.4 oz (90.901 kg).  LABORATORY DATA: Lab Results  Component Value Date   WBC 4.5 08/04/2013   HGB 13.8 08/04/2013   HCT 42.4 08/04/2013   MCV 93.3 08/04/2013   PLT 233 08/04/2013      Chemistry      Component Value Date/Time   NA 144 08/04/2013 1004   NA 138 02/28/2012 0939   NA 140 01/23/2012 0941   K 4.0 08/04/2013 1004   K 4.6 02/28/2012 0939   K 3.6 01/23/2012 0941   CL 106 12/09/2012 0806   CL 99 02/28/2012 0939   CL 105 01/23/2012 0941   CO2 27 08/04/2013 1004   CO2 28 02/28/2012 0939   CO2 26 01/23/2012 0941   BUN 13.0 08/04/2013 1004   BUN 12 02/28/2012 0939   BUN 11 01/23/2012 0941   CREATININE 0.8 08/04/2013 1004   CREATININE 1.0 02/28/2012 0939    CREATININE 0.83 01/23/2012 0941      Component Value Date/Time   CALCIUM 10.0 08/04/2013 1004   CALCIUM 9.1 02/28/2012 0939   CALCIUM 9.5 01/23/2012 0941   ALKPHOS 74 08/04/2013 1004   ALKPHOS 80 02/28/2012 0939   ALKPHOS 72 01/23/2012 0941   AST 14 08/04/2013 1004   AST 20 02/28/2012 0939   AST 17 01/23/2012 0941   ALT 15 08/04/2013 1004   ALT 21 02/28/2012 0939   ALT 17 01/23/2012 0941   BILITOT 1.16 08/04/2013 1004   BILITOT 1.30 02/28/2012 0939   BILITOT 1.3* 01/23/2012 0941       RADIOGRAPHIC STUDIES:  ASSESSMENT AND PLAN: this is a very pleasant 67 years old African American female with metastatic non-small cell lung cancer adenocarcinoma, with positive EGFR mutation but also resistant mutation T790M. She has been on treatment with oral Tarceva for more than 6 years with no evidence for disease progression. She is currently undergoing palliative radiotherapy to the enlarging right upper lobe lung nodule which was the only site of disease progression on his recent scan. I recommended for her to continue treatment with Tarceva with the same dose, but if she has any other evidence for disease progression on the upcoming scan, I may refer the patient for a clinical trial verified with the intent his inhibitor that targets the resistant mutation T790M, likely [id number] or [id number]. She would come back for follow up visit in 6 weeks for reevaluation.  She was advised to call immediately if she has any concerning symptoms in the interval.  The patient voices understanding of current disease status and treatment options and is in agreement with the current care plan.  All questions were answered. The patient knows to call the clinic with any problems, questions or concerns. We can certainly see the patient much sooner if necessary.  I spent 15 minutes counseling the patient face to face. The total time spent in the appointment was 25 minutes.

## 2013-08-04 NOTE — Patient Instructions (Signed)
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  BIOMARKERS TESTING ([facility]): Positive for: EGFR E746-A750 del, T790M, CTNNB1 D32G, SMAD4 T153fs*10  Negative for: RET, ALK, BRAF, KRAS, ERBB2 and MET   CURRENT THERAPY:  1) Tarceva 150 mg by mouth daily with therapy beginning 06/03/2007.  2) palliative radiotherapy to the enlarging right upper lobe lung nodule under the care of Dr. Dayton Scrape.  CHEMOTHERAPY INTENT: palliative  CURRENT # OF CHEMOTHERAPY CYCLES: 74  CURRENT ANTIEMETICS: Compazine when necessary  CURRENT SMOKING STATUS: nonsmoker  ORAL CHEMOTHERAPY AND CONSENT: Tarceva  CURRENT BISPHOSPHONATES USE: None  PAIN MANAGEMENT: None  NARCOTICS INDUCED CONSTIPATION: None  LIVING WILL AND CODE STATUS: Full code

## 2013-08-04 NOTE — Telephone Encounter (Signed)
RECEIVED A FAX FROM [facility] CONCERNING A CONFIRMATION OF FACSIMILE RECEIPT FOR PT. REFERRAL. 

## 2013-08-05 ENCOUNTER — Ambulatory Visit
Admission: RE | Admit: 2013-08-05 | Discharge: 2013-08-05 | Disposition: A | Discharge status: Home / Self Care | Payer: Medicare Other | Source: Ambulatory Visit | Attending: Radiation Oncology | Admitting: Radiation Oncology

## 2013-08-05 DIAGNOSIS — C3490 Malignant neoplasm of unspecified part of unspecified bronchus or lung: Secondary | ICD-10-CM | POA: Diagnosis not present

## 2013-08-05 DIAGNOSIS — C7800 Secondary malignant neoplasm of unspecified lung: Secondary | ICD-10-CM | POA: Diagnosis not present

## 2013-08-05 DIAGNOSIS — C7931 Secondary malignant neoplasm of brain: Secondary | ICD-10-CM | POA: Diagnosis not present

## 2013-08-05 DIAGNOSIS — Z510 Encounter for antineoplastic radiation therapy: Secondary | ICD-10-CM | POA: Diagnosis not present

## 2013-08-06 ENCOUNTER — Telehealth: Payer: Self-pay | Admitting: Internal Medicine

## 2013-08-06 ENCOUNTER — Ambulatory Visit
Admission: RE | Admit: 2013-08-06 | Discharge: 2013-08-06 | Disposition: A | Discharge status: Home / Self Care | Payer: Medicare Other | Source: Ambulatory Visit | Attending: Radiation Oncology | Admitting: Radiation Oncology

## 2013-08-06 ENCOUNTER — Ambulatory Visit: Payer: Medicare Other | Admitting: Radiation Oncology

## 2013-08-06 DIAGNOSIS — C3490 Malignant neoplasm of unspecified part of unspecified bronchus or lung: Secondary | ICD-10-CM | POA: Diagnosis not present

## 2013-08-06 DIAGNOSIS — C7800 Secondary malignant neoplasm of unspecified lung: Secondary | ICD-10-CM | POA: Diagnosis not present

## 2013-08-06 DIAGNOSIS — Z510 Encounter for antineoplastic radiation therapy: Secondary | ICD-10-CM | POA: Diagnosis not present

## 2013-08-06 DIAGNOSIS — C7931 Secondary malignant neoplasm of brain: Secondary | ICD-10-CM | POA: Diagnosis not present

## 2013-08-06 NOTE — Telephone Encounter (Signed)
worked 12/17 POF sw pt adv of 09/17/13 appts  AVS and CAL mailed shh

## 2013-08-07 ENCOUNTER — Ambulatory Visit: Payer: Medicare Other | Admitting: Radiation Oncology

## 2013-08-07 ENCOUNTER — Ambulatory Visit
Admission: RE | Admit: 2013-08-07 | Discharge: 2013-08-07 | Disposition: A | Discharge status: Home / Self Care | Payer: Medicare Other | Source: Ambulatory Visit | Attending: Radiation Oncology | Admitting: Radiation Oncology

## 2013-08-07 DIAGNOSIS — Z510 Encounter for antineoplastic radiation therapy: Secondary | ICD-10-CM | POA: Diagnosis not present

## 2013-08-07 DIAGNOSIS — C7931 Secondary malignant neoplasm of brain: Secondary | ICD-10-CM | POA: Diagnosis not present

## 2013-08-07 DIAGNOSIS — C3490 Malignant neoplasm of unspecified part of unspecified bronchus or lung: Secondary | ICD-10-CM

## 2013-08-07 DIAGNOSIS — C7800 Secondary malignant neoplasm of unspecified lung: Secondary | ICD-10-CM | POA: Diagnosis not present

## 2013-08-07 NOTE — Progress Notes (Signed)
  Radiation Oncology         [phone] ________________________________  Name: Valerie [name] MRN: [id number]  Date: 08/07/2013  DOB: 1945/12/26   Simulation verification note  The patient underwent film verification for the patient's set-up in preparation for stereotactic body radiosurgery. The patient was placed on the treatment unit and a CT scan was performed. These images were then fused with the patient's planning CT scan. The fusion was carefully reviewed in terms of the patient's anatomy as it related to the planning CT scan. The target structures as well as the organs at risk were evaluated on the patient's treatment CT scan. The target and the normal structures were appropriately aligned for treatment. Therefore the patient proceeded with the fraction of stereotactic body radiosurgery.  Fraction: 9  Dose:  45 Gy   ________________________________  Radene Gunning, MD, PhD

## 2013-08-10 ENCOUNTER — Encounter: Payer: Self-pay | Admitting: Radiation Oncology

## 2013-08-10 ENCOUNTER — Ambulatory Visit
Admission: RE | Admit: 2013-08-10 | Discharge: 2013-08-10 | Disposition: A | Discharge status: Home / Self Care | Payer: Medicare Other | Source: Ambulatory Visit | Attending: Radiation Oncology | Admitting: Radiation Oncology

## 2013-08-10 VITALS — BP 112/76 | HR 99 | Temp 98.3°F | Resp 20 | Wt 201.8 lb

## 2013-08-10 DIAGNOSIS — C7801 Secondary malignant neoplasm of right lung: Secondary | ICD-10-CM

## 2013-08-10 DIAGNOSIS — C3490 Malignant neoplasm of unspecified part of unspecified bronchus or lung: Secondary | ICD-10-CM | POA: Diagnosis not present

## 2013-08-10 DIAGNOSIS — Z510 Encounter for antineoplastic radiation therapy: Secondary | ICD-10-CM | POA: Diagnosis not present

## 2013-08-10 DIAGNOSIS — C7800 Secondary malignant neoplasm of unspecified lung: Secondary | ICD-10-CM | POA: Diagnosis not present

## 2013-08-10 DIAGNOSIS — C7931 Secondary malignant neoplasm of brain: Secondary | ICD-10-CM | POA: Diagnosis not present

## 2013-08-10 NOTE — Progress Notes (Signed)
Pt states she was put back on Tarceva, denies diarrhea today. She has Imodium to use prn. Pt denies pain, cough, SOB, loss of appetite. She is fatigued. Pt completed treatment today, gave her 1 month FU card.

## 2013-08-10 NOTE — Progress Notes (Signed)
Please see dictated weekly management note from earlier today.

## 2013-08-10 NOTE — Progress Notes (Signed)
Weekly Management Note:  Site: Right lung Current Dose:  5000  cGy Projected Dose: 5000  cGy  Narrative: The patient is seen today for routine under treatment assessment. CBCT/MVCT images/port films were reviewed. The chart was reviewed.   She is without complaints today. No respiratory difficulties.  Physical Examination:  Filed Vitals:   08/10/13 1138  BP: 112/76  Pulse: 99  Temp: 98.3 F (36.8 C)  Resp: 20  .  Weight: 201 lb 12.8 oz (91.536 kg). No change. No significant skin changes.  Impression: Tolerating radiation therapy well. Radiation therapy is completed today. Plan: Continue radiation therapy as planned. Followup visit in one month. She will see Dr. Shirline Frees on December 29.

## 2013-08-10 NOTE — Progress Notes (Signed)
[facility] Radiation Oncology End of Treatment Note  Name:Valerie [name]  Date: 08/10/2013 ZOX:[id number] DOB:18-Nov-1945   Status:outpatient    CC: [name], MD  Dr. Si Gaul  REFERRING PHYSICIAN: Dr. Si Gaul     DIAGNOSIS: Metastatic non-small cell carcinoma to the right lung   INDICATION FOR TREATMENT: Curative   TREATMENT DATES: 07/28/2013 through 08/10/2013                          SITE/DOSE:     Solitary right lung metastasis 5000 cGy in 10 sessions                       BEAMS/ENERGY:  6 MV photons, dynamic conformal ARC therapy (radiosurgery). Daily cone beam CT for image guidance.               NARRATIVE: [name] tolerated her treatment well with no toxicity during her course of therapy. She underwent fractionated radiosurgery in view of her previous treatment which include the proximal right bronchi.                           PLAN: Routine followup in one month. Patient instructed to call if questions or worsening complaints in interim.

## 2013-08-15 IMAGING — CT CT CHEST W/ CM
3 of 9 series · 11 of 46 positions shown, 18 images · IV contrast ([ID] OMNI 300)
Comparison: 05/17/2011

CT CHEST

CLINICAL DATA: Lung cancer diagnosed 6446 status post left lower
lobectomy, chemotherapy and XRT complete, tarceva in progress,
right neck nodule

CT CHEST, ABDOMEN AND PELVIS WITH CONTRAST
TECHNIQUE: Multidetector CT imaging of the chest, abdomen and
pelvis was performed following the standard protocol during bolus
administration of intravenous contrast.
Contrast: 100mL OMNIPAQUE IOHEXOL 300 MG/ML IV SOLN

[Series 3: cap st · axial · 0.72mm/px · z∈[-624,-104]mm · 8 of 134 slices shown, 13 images]
[im 15/134  soft-tissue]
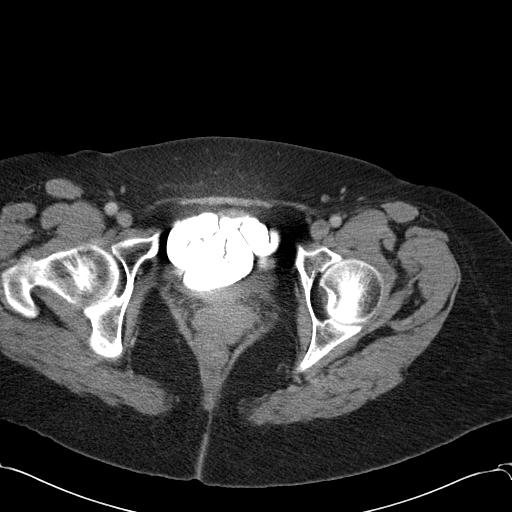
[im 15/134  bone]
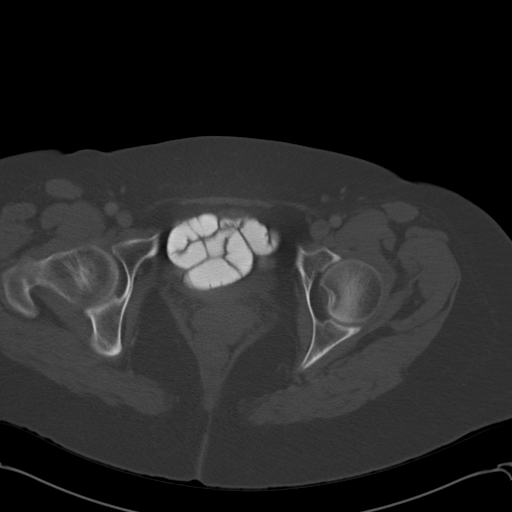
[im 30/134  soft-tissue]
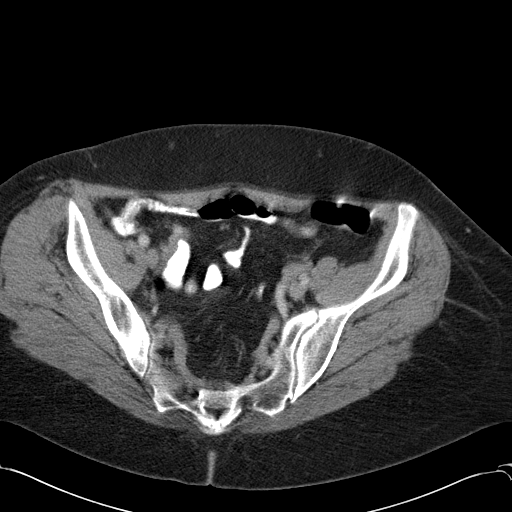
[im 45/134  soft-tissue]
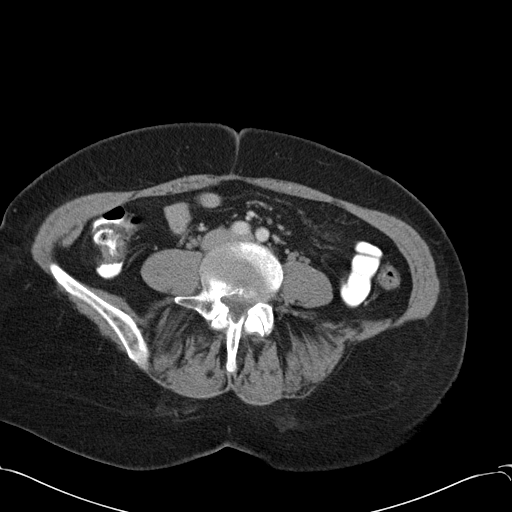
[im 60/134  soft-tissue]
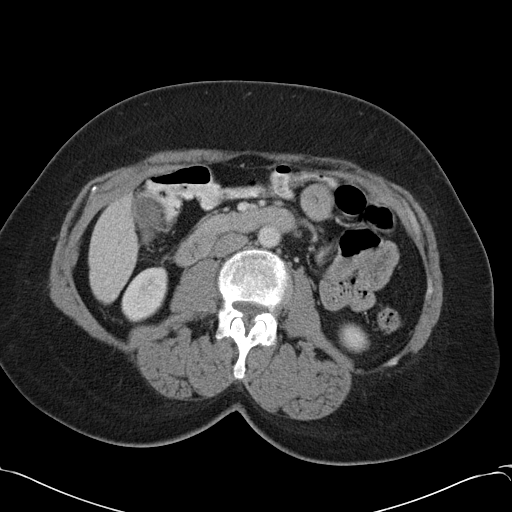
[im 74/134  soft-tissue]
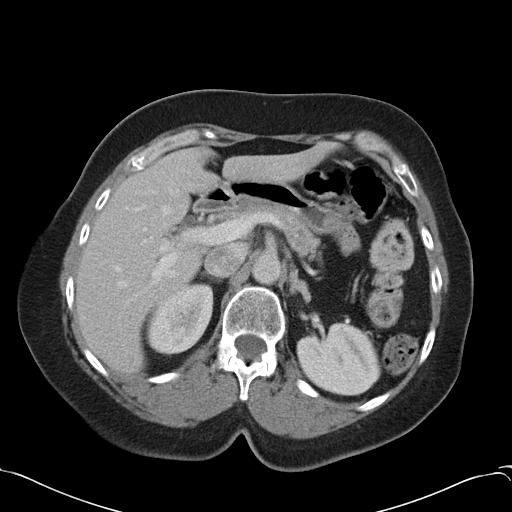
[im 74/134  lung]
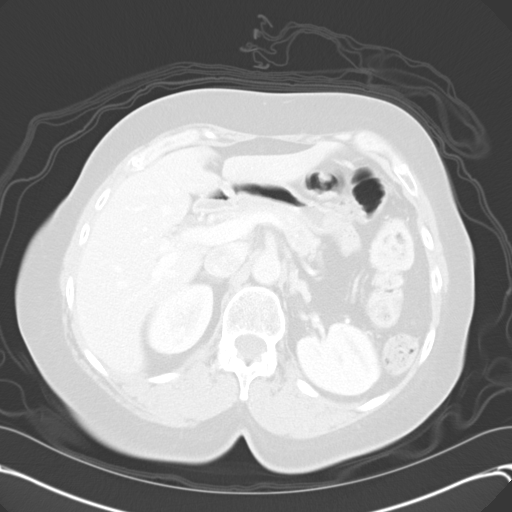
[im 89/134  soft-tissue]
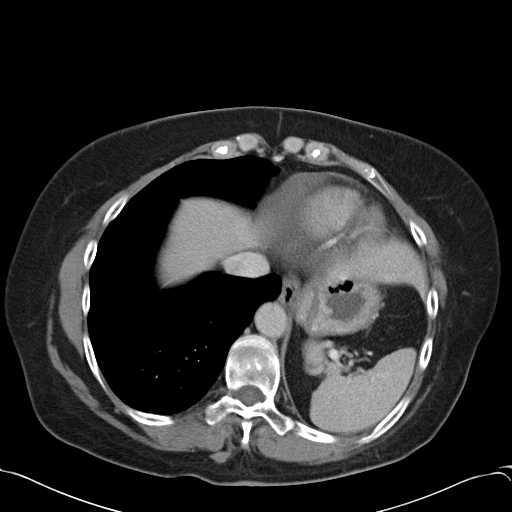
[im 89/134  lung]
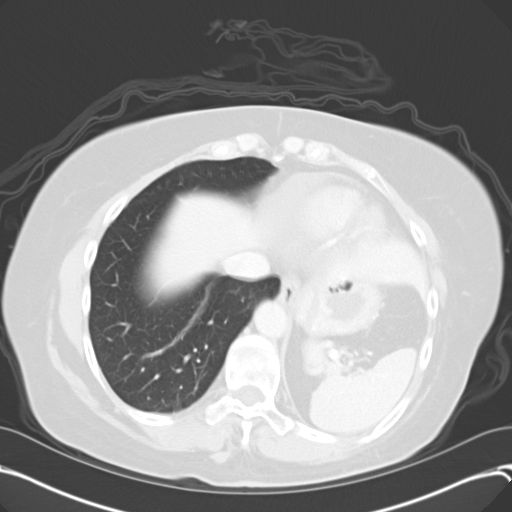
[im 104/134  soft-tissue]
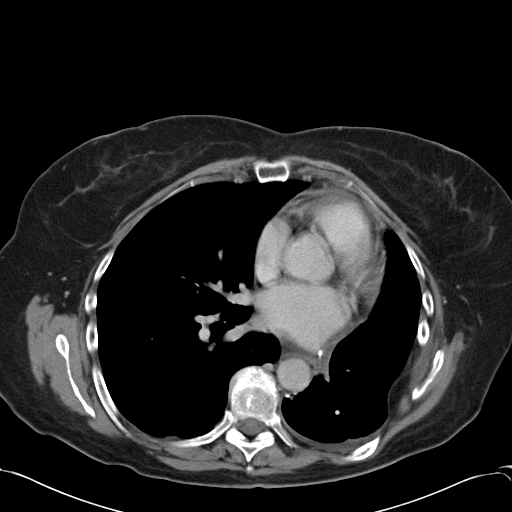
[im 104/134  lung]
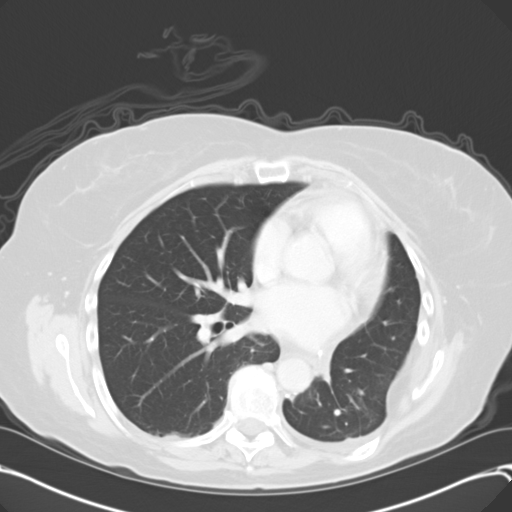
[im 119/134  soft-tissue]
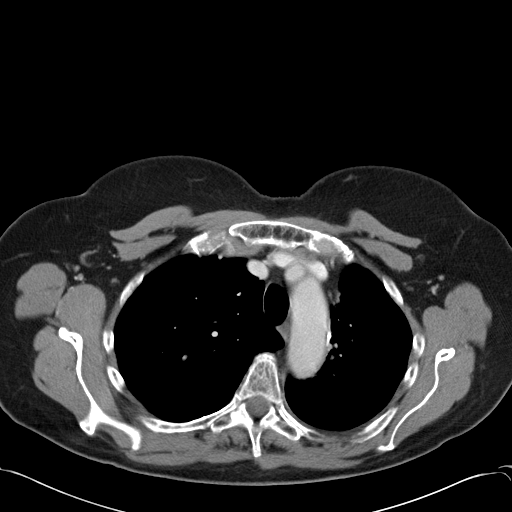
[im 119/134  lung]
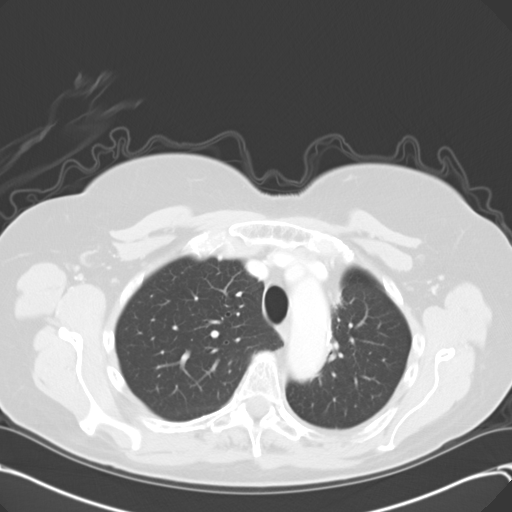

[Series 603: <mpr thick range(1)> · sagittal · 1.30mm/px · 1 of 120 slices shown, 2 images]
[im 60/120  soft-tissue]
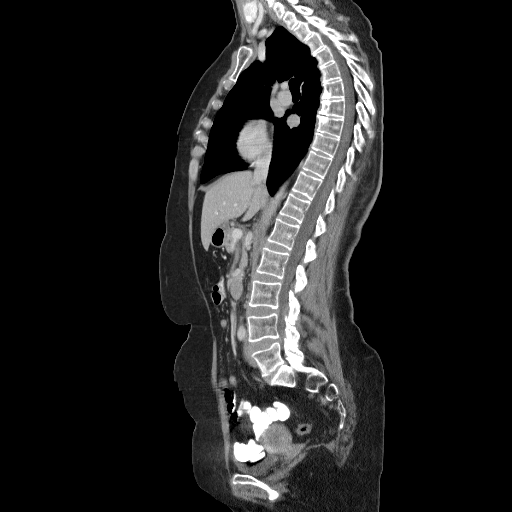
[im 60/120  bone]
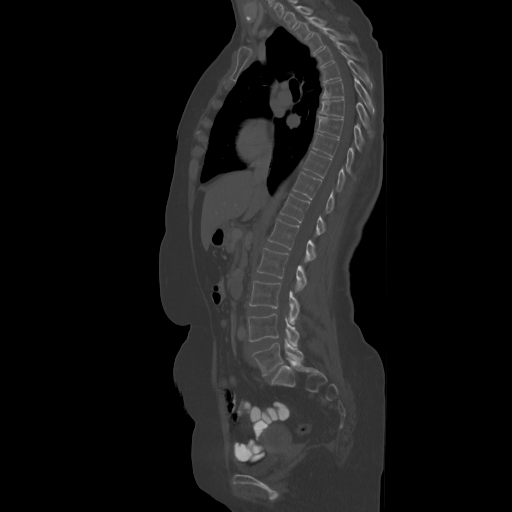

[Series 604: <mpr thick range(2)> · coronal · 0.50mm/px · 2 of 63 slices shown, 3 images]
[im 21/63  soft-tissue]
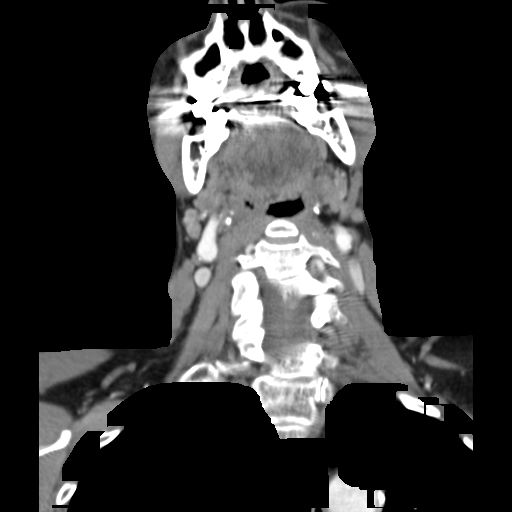
[im 21/63  bone]
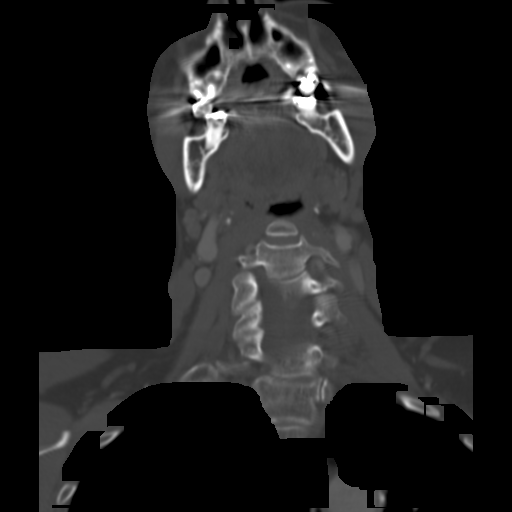
[im 42/63  soft-tissue]
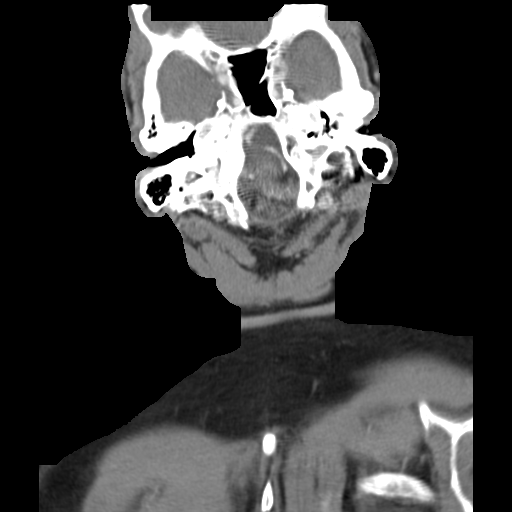

[11 of 46 positions shown; findings below may reference images not displayed]

FINDINGS: Status post left lower lobectomy.  Associated volume
loss with pleural thickening in the left hemithorax.  Left
paramediastinal radiation changes.  No suspicious pulmonary
nodules.  No pleural effusion or pneumothorax.

Visualized thyroid is unremarkable.

The heart is normal in size.  No pericardial effusion.

No suspicious mediastinal, hilar, or axillary lymphadenopathy.  12
mm short axis right supraclavicular lymph node (series 3/image 8),
previously 10 mm, possibly with central necrosis.

Degenerative changes of the thoracic spine.
IMPRESSION: Status post left lower lobectomy with left paramediastinal
radiation changes.

12 mm right supraclavicular lymph node, mildly increased,
nonspecific but worrisome for malignancy.  Consider biopsy or PET
CT for further evaluation.

Otherwise, no evidence of recurrent/metastatic disease in the
chest.

CT ABDOMEN AND PELVIS
FINDINGS: Liver, spleen, pancreas, and adrenal glands within
normal limits.

Gallbladder is unremarkable.  No intrahepatic or extrahepatic
ductal dilatation.

Kidneys within normal limits.  No hydronephrosis.

No evidence of bowel obstruction.

No evidence of abdominal aortic aneurysm.

No abdominopelvic ascites.

No suspicious abdominopelvic lymphadenopathy.

Uterus is mildly heterogeneous, suggesting uterine fibroids.  No
adnexal masses.

Bladder is unremarkable.

Mild degenerative changes of the lumbar spine.
IMPRESSION: No evidence of metastatic disease in the abdomen/pelvis.

## 2013-08-15 IMAGING — MR MR HEAD WO/W CM
7 of 12 series · 26 of 48 positions shown · IV contrast (multihance)
Comparison: 08/20/2011 neck CT.  06/13/2010 and 02/25/2006 brain
MR.

CLINICAL DATA: History of lung cancer with metastatic disease to
the brain requiring prior surgery.  Presently no neurological
symptoms.

MRI HEAD WITHOUT AND WITH CONTRAST
TECHNIQUE: Multiplanar, multiecho pulse sequences of the brain and
surrounding structures were obtained according to standard protocol
without and with intravenous contrast
Contrast: 20mL MULTIHANCE GADOBENATE DIMEGLUMINE 529 MG/ML IV SOLN

[Series 4: DWI · axial · 5.0mm · 1.09mm/px · z∈[-82,+62]mm · 7 of 60 slices shown (1 of 2)]
[im 1/60]
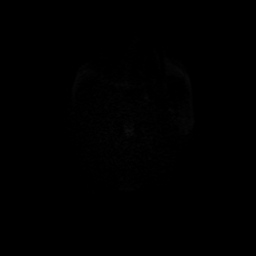
[im 10/60]
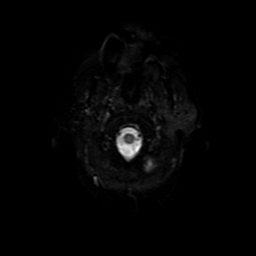
[im 20/60]
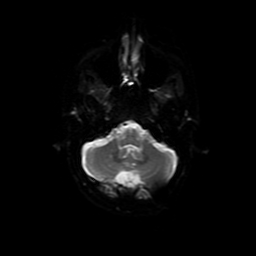
[im 30/60]
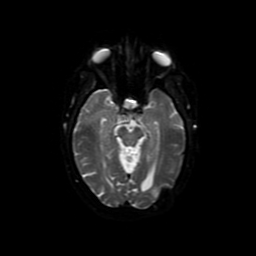
[im 40/60]
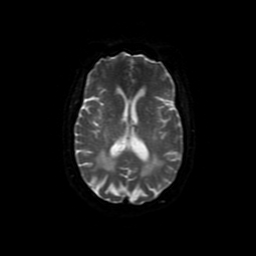
[im 50/60]
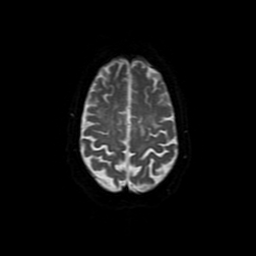
[im 60/60]
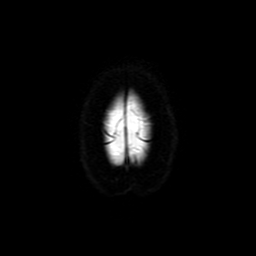

[Series 5: T2 · axial · 5.0mm · 0.43mm/px · z∈[-74,+72]mm · 3 of 24 slices shown]
[im 1/24]
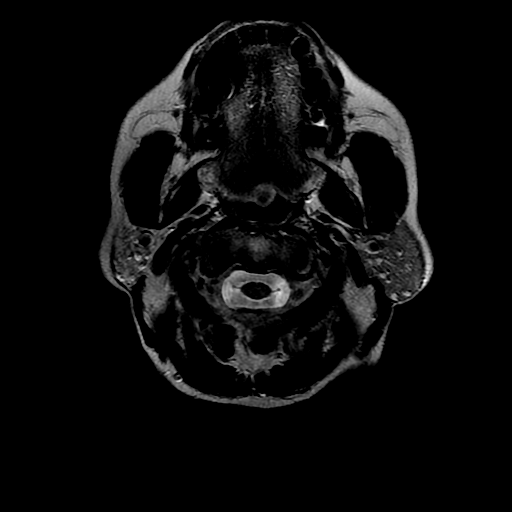
[im 12/24]
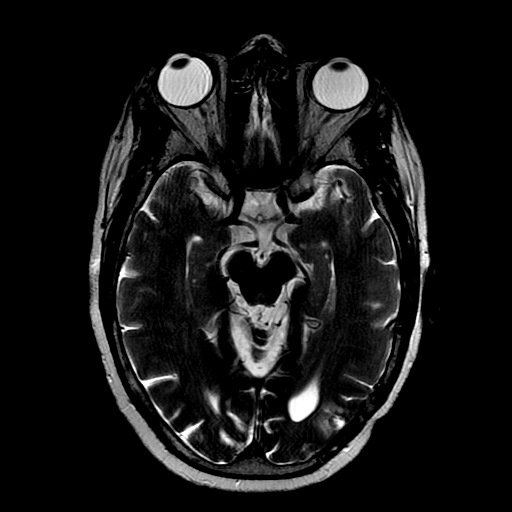
[im 24/24]
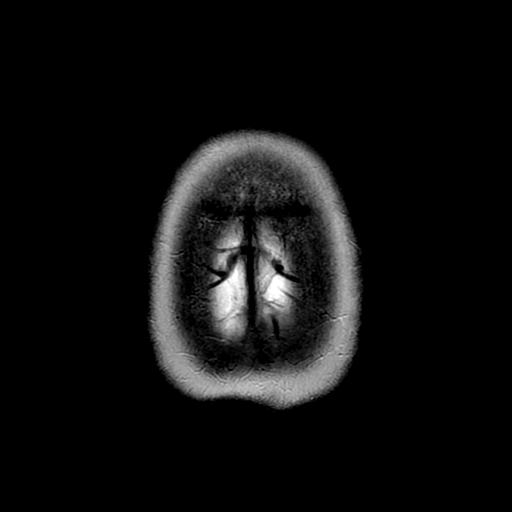

[Series 6: FLAIR · axial · 5.0mm · 0.43mm/px · z∈[-80,+78]mm · 3 of 24 slices shown (1 of 2)]
[im 1/24]
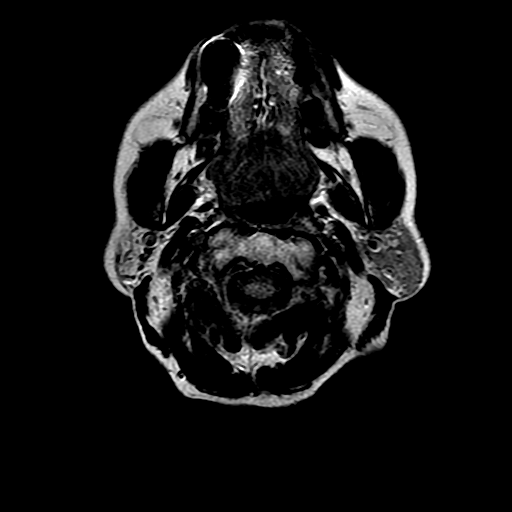
[im 12/24]
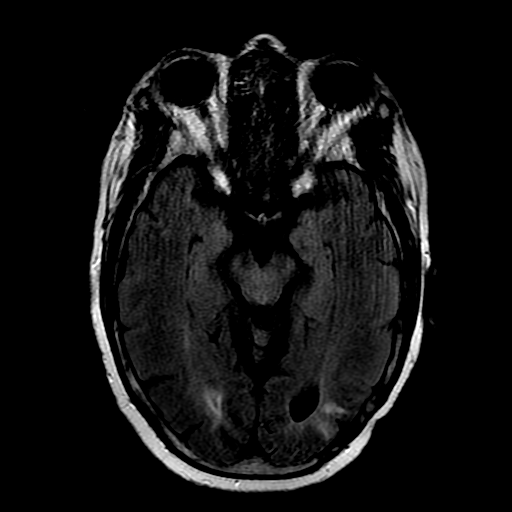
[im 24/24]
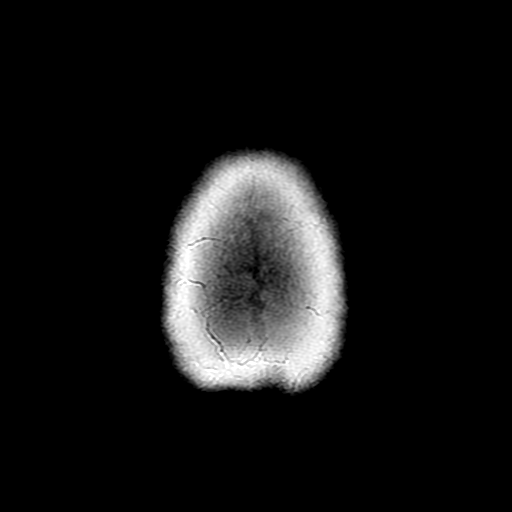

[Series 9: FLAIR · axial · 5.0mm · 0.43mm/px · z∈[-80,+78]mm · 3 of 28 slices shown (2 of 2)]
[im 1/28]
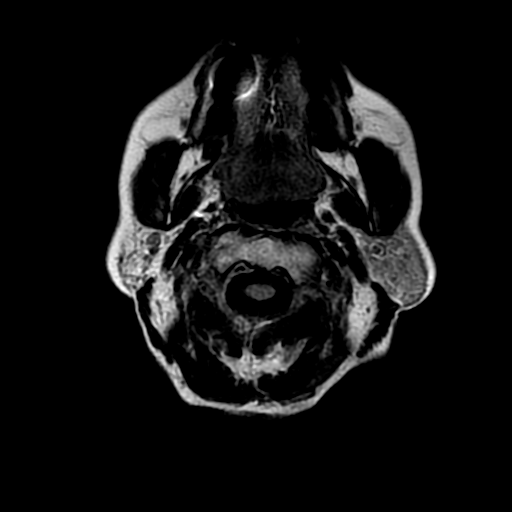
[im 14/28]
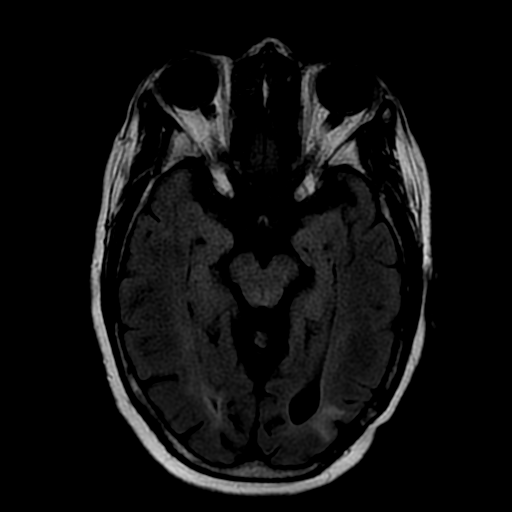
[im 28/28]
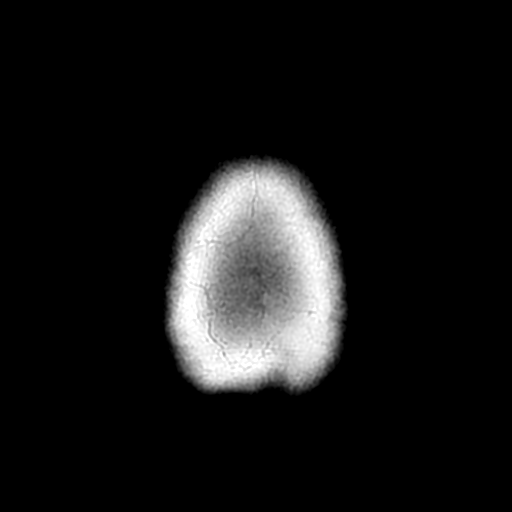

[Series 13: T1 post-contrast · coronal · 5.0mm · 0.45mm/px · 3 of 24 slices shown (1 of 2)]
[im 1/24]
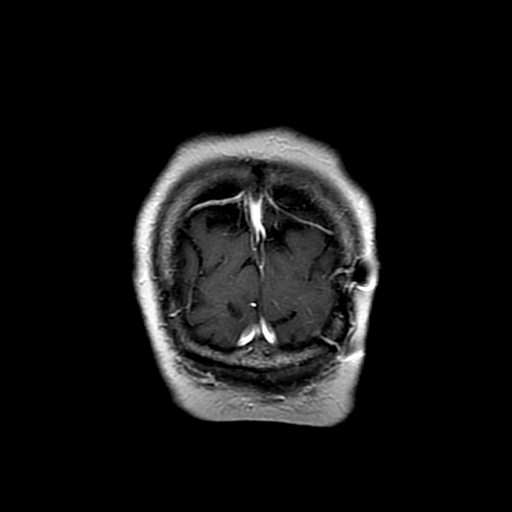
[im 12/24]
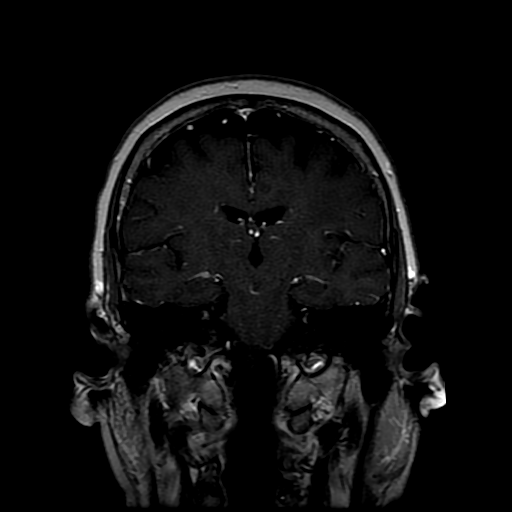
[im 24/24]
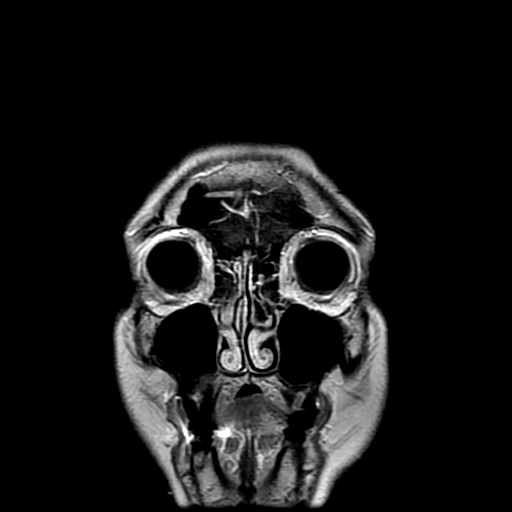

[Series 14: T1 post-contrast · sagittal · 5.0mm · 0.47mm/px · 3 of 24 slices shown (2 of 2)]
[im 1/24]
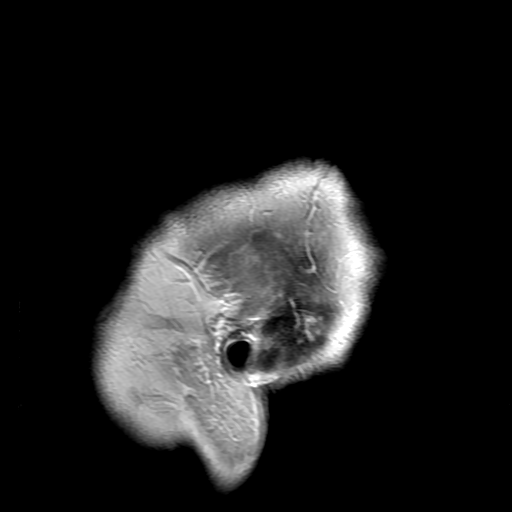
[im 12/24]
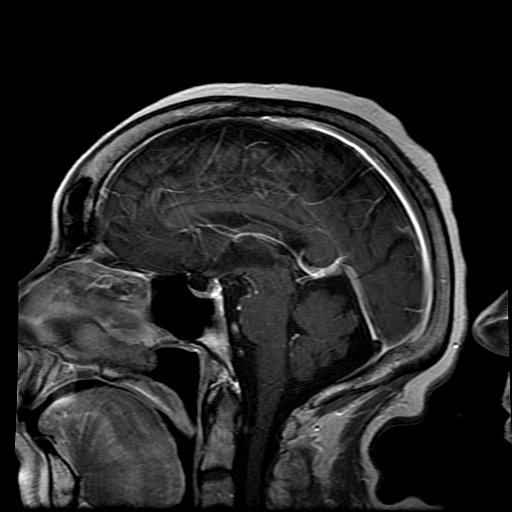
[im 24/24]
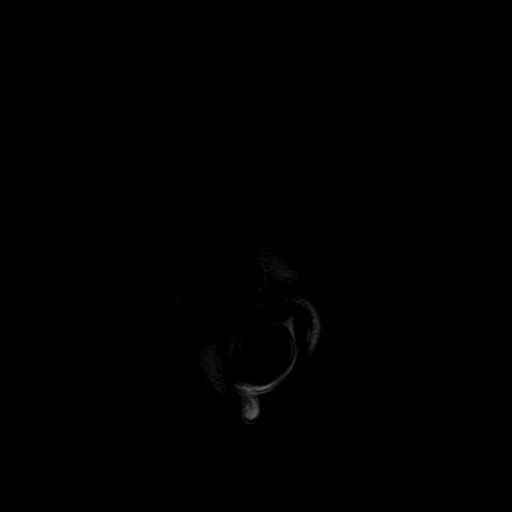

[Series 400: DWI · axial · 5.0mm · 1.09mm/px · z∈[-82,+62]mm · 4 of 30 slices shown (2 of 2)]
[im 1/30]
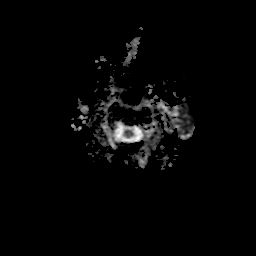
[im 10/30]
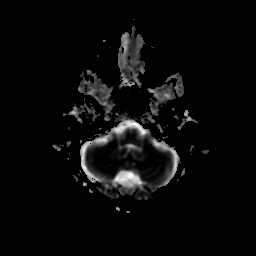
[im 20/30]
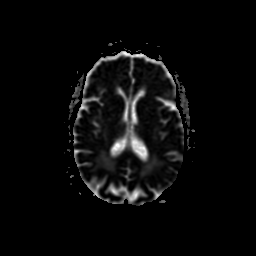
[im 30/30]
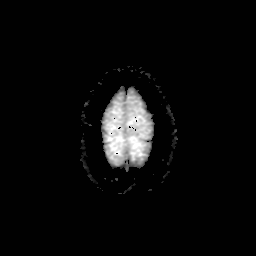

[26 of 48 positions shown; findings below may reference images not displayed]

FINDINGS: Prior left posterior temporal - occipital craniotomy for
resection of tumor. The area of nodular enhancement adjacent to the
craniotomy site at the posterior left temporal - occipital lobe
junction is unchanged more suggestive of postoperative changes than
tumor.

No new area of enhancement or bony destruction to suggest
progressive intracranial metastatic disease.

No acute infarct.

Partially empty sella incidentally noted.

Prominent white matter type changes may represent result of small
vessel disease and / or treatment tumor.

Mild global atrophy without hydrocephalus.

Major intracranial vascular structures are patent.

Minimal partial opacification right mastoid air cells.

Polypoid lesion on the right maxillary sinus originates from the
base of the upper molars and is unchanged.

Tiny area of blood breakdown products right opercular region may be
related to episode of remote hemorrhagic ischemia otherwise no
evidence of intracranial hemorrhage.
IMPRESSION: Prior left posterior temporal - occipital craniotomy for resection
of tumor. The area of nodular enhancement adjacent to the
craniotomy site at the posterior left temporal - occipital lobe
junction is unchanged more suggestive of postoperative changes than
tumor.

No new area of enhancement or bony destruction to suggest
progressive intracranial metastatic disease.

No acute infarct.

Prominent white matter type changes may represent result of small
vessel disease and / or treatment tumor.

## 2013-08-24 DIAGNOSIS — R197 Diarrhea, unspecified: Secondary | ICD-10-CM | POA: Diagnosis not present

## 2013-08-24 DIAGNOSIS — G3184 Mild cognitive impairment, so stated: Secondary | ICD-10-CM | POA: Diagnosis not present

## 2013-08-24 IMAGING — PT NM PET TUM IMG INITIAL (PI) SKULL BASE T - THIGH
6 series · 25 of 25 positions shown · non-contrast
Comparison: CT neck chest abdomen pelvis 08/20/2011
COMPARISON: CT neck chest abdomen pelvis 08/20/2011

<!--  IDXRADR:ADDEND:BEGIN -->Addendum Begins
<!--  IDXRADR:ADDEND:INNER_BEGIN -->***ADDENDUM*** CREATED: 09/28/2011 [DATE]

Initial dictation by Dr. Mompremier.  Addendum by Dr. Garman
There is significant misregistration in the head between the PET
images and the CT images due to patient changing head positions
between the two scans.  The hypermetabolic activity localized to
the left base of tongue and right level II  lymph node described
below are actually metabolic activity within the submandibular
glands.  Physiologic uptake noted within the vallecula,  glottis
and longus coli muscles.    There is no evidence of head and  neck
cancer primary neoplasm on the PET/CT scan.
The abnormal activity within the right supraclavicular node is
registered properly within the chest.
Revised findings discussed with Dr. Broderick.
***END ADDENDUM*** SIGNED BY: Abed Rahmen Reve, M.D.
CLINICAL DATA: Initial treatment strategy for lung cancer.
Restaging.
NUCLEAR MEDICINE PET CT INITIAL (PI) SKULL BASE TO THIGH
TECHNIQUE: 17.3 mCi F-18 FDG was injected intravenously via the
right wrist.  Full-ring PET imaging was performed from the skull
base through the mid-thighs 62  minutes after injection.  CT data
was obtained and used for attenuation correction and anatomic
localization only.  (This was not acquired as a diagnostic CT
examination.)
Fasting Blood Glucose:  99

[Series 1: pet ac · axial · 3.3mm · 4.69mm/px · z∈[-870,+0]mm · 5 of 267 slices shown]
[im 1/267]
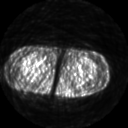
[im 67/267]
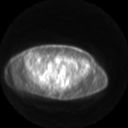
[im 134/267]
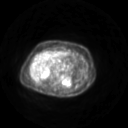
[im 200/267]
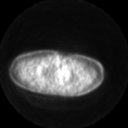
[im 267/267]
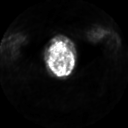

[Series 2: ct images · axial · 3.8mm · 0.98mm/px · z∈[-870,+0]mm · 5 of 267 slices shown]
[im 1/267]
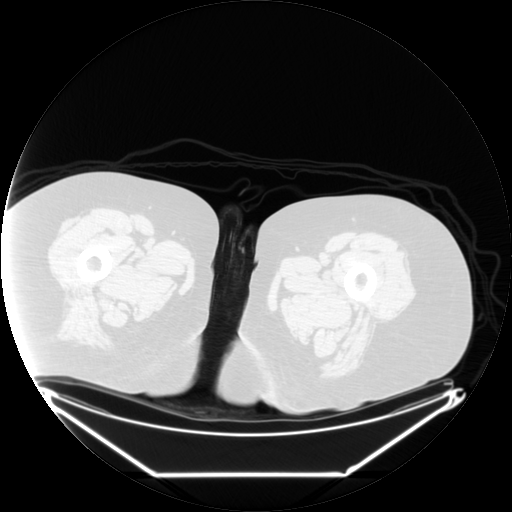
[im 67/267]
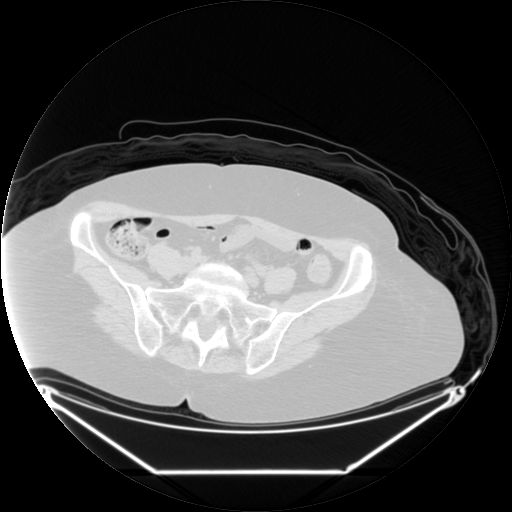
[im 134/267]
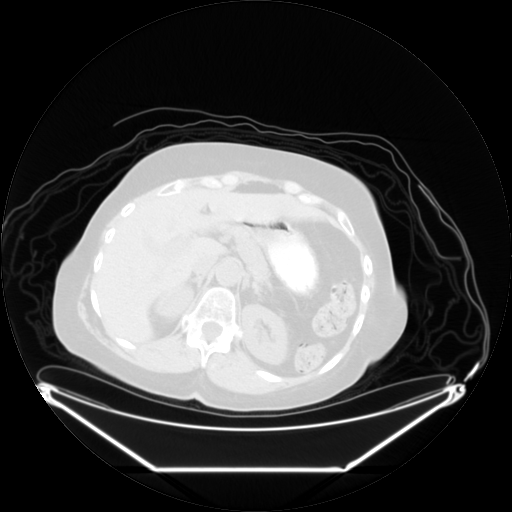
[im 200/267]
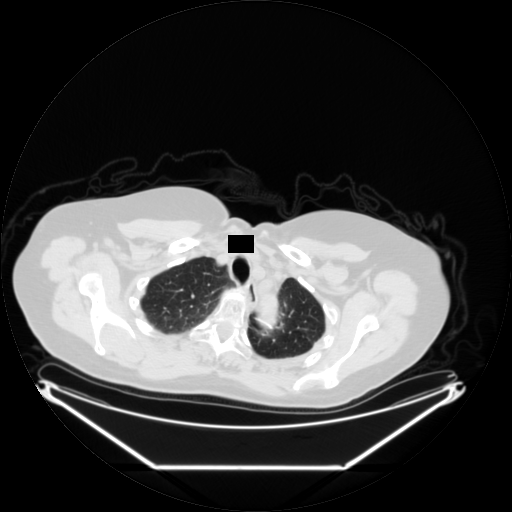
[im 267/267  brain]
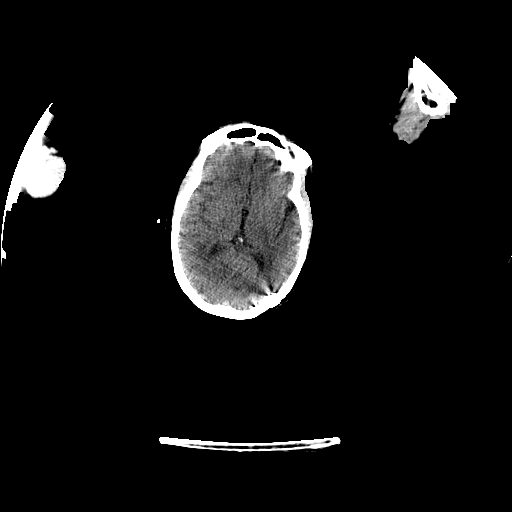

[Series 2: pet nac · axial · 3.3mm · 4.69mm/px · z∈[-870,+0]mm · 6 of 267 slices shown]
[im 1/267]
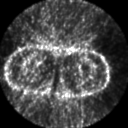
[im 54/267]
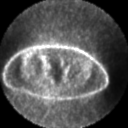
[im 107/267]
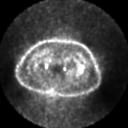
[im 160/267]
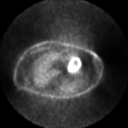
[im 213/267]
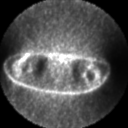
[im 267/267]
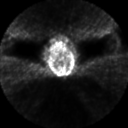

[Series 123: mip · coronal · 3.3mm · 4.69mm/px · 1 of 30 slices shown]
[im 1/30]
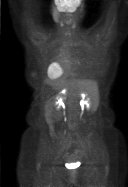

[Series 151: reformatted · axial · 3.3mm · 3.91mm/px · z∈[-870,+0]mm · 6 of 265 slices shown (1 of 2)]
[im 1/265]
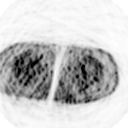
[im 53/265]
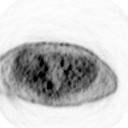
[im 106/265]
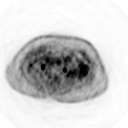
[im 159/265]
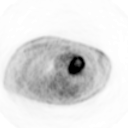
[im 212/265]
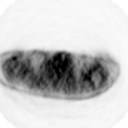
[im 265/265]
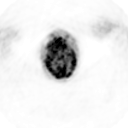

[Series 153: reformatted · coronal · 4.7mm · 6.98mm/px · 2 of 75 slices shown (2 of 2)]
[im 1/75]
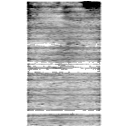
[im 75/75]
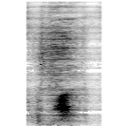

[25 of 25 positions shown; findings below may reference images not displayed]

FINDINGS: Focal uptake is seen along the left base of tongue or
glossotonsillar sulcus, with an S U V max of 7.7 (PET image 35).
Similarly, focal hypermetabolism is seen in the right lateral
oropharynx with an S U V max of 6.1. In retrospect, it is difficult
to completely exclude subtle soft tissue thickening in these areas.

There is focal uptake posterior to the right submandibular gland,
with an SUV max of 6.1 (image 39).  This may correspond to a 1.2 cm
long axis lymph node on diagnostic examination performed 08/20/2011
(series 5, image 42).  Uptake is seen in the left parotid, which
has been shown to be asymmetrically increased in attenuation on CT
dating back to 09/20/2005.

Finally, a 9 mm right supraclavicular lymph node (CT image 65) has
an S U V max of 3.8.  No additional areas of abnormal
hypermetabolism in the neck, chest, abdomen or pelvis.

CT images were performed for attenuation correction.
Interpretation of these images will be deferred to diagnostic
examinations of the neck, chest, abdomen and pelvis performed
08/20/2011.  No intervening pericardial or new pleural effusion.
IMPRESSION: 1.  Areas of hypermetabolism along the left base of tongue and
right oropharynx, with hypermetabolic right level II and right
supraclavicular lymph nodes.  A head/neck primary malignancy is
considered.
2.  No areas of abnormal hypermetabolism in the chest, abdomen or
pelvis.
3.  Uptake within the left parotid gland corresponds to asymmetric
increased attenuation on CT, dating back to 09/20/2005, suggesting
the possibility of chronic inflammation.
4.  Interpretation of CT images will be deferred to diagnostic
examination performed 08/20/2011.

<!--  IDXRADR:ADDEND:INNER_END -->Addendum Ends
<!--  IDXRADR:ADDEND:END -->*RADIOLOGY REPORT*
FINDINGS: Focal uptake is seen along the left base of tongue or
glossotonsillar sulcus, with an S U V max of 7.7 (PET image 35).
Similarly, focal hypermetabolism is seen in the right lateral
oropharynx with an S U V max of 6.1. In retrospect, it is difficult
to completely exclude subtle soft tissue thickening in these areas.

There is focal uptake posterior to the right submandibular gland,
with an SUV max of 6.1 (image 39).  This may correspond to a 1.2 cm
long axis lymph node on diagnostic examination performed 08/20/2011
(series 5, image 42).  Uptake is seen in the left parotid, which
has been shown to be asymmetrically increased in attenuation on CT
dating back to 09/20/2005.

Finally, a 9 mm right supraclavicular lymph node (CT image 65) has
an S U V max of 3.8.  No additional areas of abnormal
hypermetabolism in the neck, chest, abdomen or pelvis.

CT images were performed for attenuation correction.
Interpretation of these images will be deferred to diagnostic
examinations of the neck, chest, abdomen and pelvis performed
08/20/2011.  No intervening pericardial or new pleural effusion.
IMPRESSION: 1.  Areas of hypermetabolism along the left base of tongue and
right oropharynx, with hypermetabolic right level II and right
supraclavicular lymph nodes.  A head/neck primary malignancy is
considered.
2.  No areas of abnormal hypermetabolism in the chest, abdomen or
pelvis.
3.  Uptake within the left parotid gland corresponds to asymmetric
increased attenuation on CT, dating back to 09/20/2005, suggesting
the possibility of chronic inflammation.
4.  Interpretation of CT images will be deferred to diagnostic
examination performed 08/20/2011.

## 2013-09-09 ENCOUNTER — Encounter: Payer: Self-pay | Admitting: *Deleted

## 2013-09-15 ENCOUNTER — Ambulatory Visit
Admission: RE | Admit: 2013-09-15 | Discharge: 2013-09-15 | Disposition: A | Discharge status: Home / Self Care | Payer: Medicare Other | Source: Ambulatory Visit | Attending: Radiation Oncology | Admitting: Radiation Oncology

## 2013-09-15 ENCOUNTER — Encounter: Payer: Self-pay | Admitting: Radiation Oncology

## 2013-09-15 VITALS — BP 124/72 | HR 98 | Temp 97.7°F | Resp 20 | Wt 195.6 lb

## 2013-09-15 DIAGNOSIS — C7800 Secondary malignant neoplasm of unspecified lung: Secondary | ICD-10-CM

## 2013-09-15 NOTE — Progress Notes (Signed)
CC: Awilda Metro, PA  Followup note: Ms. Coletti visits today approximately 1 month following completion of hypo-fractionated SBRT the management of her metastatic non-small cell carcinoma involving the right lung. She was treated with hypofractionated treatment rather than classic 3-5 fraction SBRT because of the proximity of her metastasis to the proximal right bronchi. She is without complaints today. She denies respiratory difficulties. O2 sat on room air is 100%. She sees Awilda Metro, [place]  this Thursday.  Physical examination: Alert and oriented, in no respiratory distress. Filed Vitals:   09/15/13 1020  BP: 124/72  Pulse: 98  Temp: 97.7 F (36.5 C)  Resp: 20   Nodes: Without palpable cervical, or supraclavicular lymphadenopathy. Chest: Lungs clear.  Impression: Satisfactory progress. I told the patient that we typically obtained a three-month followup CT scan to mark her progress, and to also serve as a baseline study. I'm asking Awilda Metro PA  to schedule a chest CT in 2 months.  Plan: Followup visit here in 3 months, after a CT scan in 2 months.

## 2013-09-15 NOTE — Progress Notes (Signed)
Follow up lung rad txs: 12/9/122/14, room air sats =100%, no c/o sob excepts going up 14 steps, no c/o pain, nausea, has sniffles no coughing Appetite good energy level pretty good 10:24 AM

## 2013-09-17 ENCOUNTER — Encounter: Payer: Self-pay | Admitting: Physician Assistant

## 2013-09-17 ENCOUNTER — Other Ambulatory Visit (HOSPITAL_BASED_OUTPATIENT_CLINIC_OR_DEPARTMENT_OTHER): Payer: Medicare Other

## 2013-09-17 ENCOUNTER — Other Ambulatory Visit: Payer: Medicare Other

## 2013-09-17 ENCOUNTER — Ambulatory Visit (HOSPITAL_BASED_OUTPATIENT_CLINIC_OR_DEPARTMENT_OTHER): Payer: Medicare Other | Admitting: Physician Assistant

## 2013-09-17 ENCOUNTER — Ambulatory Visit: Payer: Medicare Other | Admitting: Physician Assistant

## 2013-09-17 ENCOUNTER — Telehealth: Payer: Self-pay | Admitting: *Deleted

## 2013-09-17 VITALS — BP 115/60 | HR 86 | Temp 97.4°F | Resp 20 | Ht 69.0 in | Wt 199.8 lb

## 2013-09-17 DIAGNOSIS — C3430 Malignant neoplasm of lower lobe, unspecified bronchus or lung: Secondary | ICD-10-CM | POA: Diagnosis not present

## 2013-09-17 DIAGNOSIS — C7931 Secondary malignant neoplasm of brain: Secondary | ICD-10-CM | POA: Diagnosis not present

## 2013-09-17 DIAGNOSIS — C7949 Secondary malignant neoplasm of other parts of nervous system: Secondary | ICD-10-CM

## 2013-09-17 DIAGNOSIS — C3490 Malignant neoplasm of unspecified part of unspecified bronchus or lung: Secondary | ICD-10-CM

## 2013-09-17 DIAGNOSIS — C770 Secondary and unspecified malignant neoplasm of lymph nodes of head, face and neck: Secondary | ICD-10-CM

## 2013-09-17 LAB — CBC WITH DIFFERENTIAL/PLATELET
BASO%: 0.5 % (ref 0.0–2.0)
Basophils Absolute: 0 10*3/uL (ref 0.0–0.1)
EOS%: 3.9 % (ref 0.0–7.0)
Eosinophils Absolute: 0.2 10*3/uL (ref 0.0–0.5)
HCT: 40.9 % (ref 34.8–46.6)
HGB: 13.5 g/dL (ref 11.6–15.9)
LYMPH%: 10.8 % — ABNORMAL LOW (ref 14.0–49.7)
MCH: 30.6 pg (ref 25.1–34.0)
MCHC: 33 g/dL (ref 31.5–36.0)
MCV: 92.6 fL (ref 79.5–101.0)
MONO#: 0.6 10*3/uL (ref 0.1–0.9)
MONO%: 9.3 % (ref 0.0–14.0)
NEUT#: 4.6 10*3/uL (ref 1.5–6.5)
NEUT%: 75.5 % (ref 38.4–76.8)
Platelets: 261 10*3/uL (ref 145–400)
RBC: 4.41 10*6/uL (ref 3.70–5.45)
RDW: 13.8 % (ref 11.2–14.5)
WBC: 6.1 10*3/uL (ref 3.9–10.3)
lymph#: 0.7 10*3/uL — ABNORMAL LOW (ref 0.9–3.3)

## 2013-09-17 LAB — COMPREHENSIVE METABOLIC PANEL (CC13)
ALT: 16 U/L (ref 0–55)
AST: 13 U/L (ref 5–34)
Albumin: 4 g/dL (ref 3.5–5.0)
Alkaline Phosphatase: 76 U/L (ref 40–150)
Anion Gap: 9 mEq/L (ref 3–11)
BUN: 15 mg/dL (ref 7.0–26.0)
CO2: 27 mEq/L (ref 22–29)
Calcium: 9.8 mg/dL (ref 8.4–10.4)
Chloride: 106 mEq/L (ref 98–109)
Creatinine: 0.9 mg/dL (ref 0.6–1.1)
Glucose: 79 mg/dl (ref 70–140)
Potassium: 3.6 mEq/L (ref 3.5–5.1)
Sodium: 143 mEq/L (ref 136–145)
Total Bilirubin: 1.11 mg/dL (ref 0.20–1.20)
Total Protein: 6.9 g/dL (ref 6.4–8.3)

## 2013-09-17 NOTE — Telephone Encounter (Signed)
appts made and printed. Pt is aware that cs will call w/ appts for CT abd/pelvis/chest...td

## 2013-09-17 NOTE — Progress Notes (Addendum)
Valerie [name] [phone]   [phone]  SHARED VISIT PROGRESS NOTE  [name], MD 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  BIOMARKERS TESTING ([facility]): Positive for: EGFR E746-A750 del, T790M, CTNNB1 D32G, SMAD4 T173fs*10 Negative for: RET, ALK, BRAF, KRAS, ERBB2 and MET  PRIOR THERAPY:  1. Status post left lower lobectomy under the care of Dr Arlyce Dice in October 2002. 2. Status post course of concurrent chemoradiation with weekly carboplatin and paclitaxel under the care of Dr Benay Spice and Dr Elba Barman in early 2003. 3. Status post treatment with Celebrex and Iressa according to the clinical trial at [facility] for a total of 1 year. 4. Status post left occipital craniotomy for tumor resection on September 21, 2005, under the care of Dr Christella Noa. 5. Status post whole brain irradiation under the care of Dr Elba Barman, completed November 07, 2005. 6. Status post gamma knife treatment to the resected cavitary recurrence on June 02, 2008, at [facility]. 7. Status post right thyroidectomy with radical neck dissection under the care of Dr. Constance Holster on 10/18/2011. 8. Status post excisional biopsy of right cervical lymph node that was consistent with metastatic adenocarcinoma.  9. Status post palliative radiotherapy to the enlarging right upper lobe lung nodule under the care of Dr. Valere Dross.   CURRENT THERAPY:  1) Tarceva 150 mg by mouth daily with therapy beginning 06/03/2007.  CHEMOTHERAPY INTENT: palliative  CURRENT # OF CHEMOTHERAPY CYCLES: 75  CURRENT ANTIEMETICS: Compazine when necessary  CURRENT SMOKING STATUS: nonsmoker  ORAL CHEMOTHERAPY AND CONSENT: Tarceva  CURRENT BISPHOSPHONATES USE: None  PAIN MANAGEMENT: None  NARCOTICS INDUCED CONSTIPATION: None  LIVING WILL AND CODE  STATUS: Full code  INTERVAL HISTORY: Valerie [name] 68 y.o. female returns to the clinic today for a follow up visit. The patient is feeling fine today with no specific complaints except forsome fatigue. She tolerated her treatment with palliative radiotherapy to the right upper lobe lung nodule fairly well. Dr. Valere Dross would like restaging CT scan of the chest in 2 months to reevaluate her after completion of palliative radiotherapy. She denied having any significant chest pain, shortness of breath, cough or hemoptysis. The patient denied having any nausea or vomiting. She denied weight loss or night sweats. She is tolerating her treatment with Tarceva fairly well with no significant skin rash but occasional episodes of diarrhea. The tissue block from the excisional biopsy of the right cervical lymph node was sent to [facility] for Biomarkers testing and it showed positive EGFR mutation in exon 19 in addition to resistant mutation T790M. [name]   MEDICAL HISTORY: Past Medical History  Diagnosis Date  . Heart murmur   . Shortness of breath     hx lung ca   . Arthritis   . Lung cancer dx'd 2002  . Metastasis to brain dx'd 2008  . 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 Anal fistula   . H/O osteoporosis   . H/O vitamin D deficiency   . History of measles, mumps, or rubella   . H/O varicella   . Hypertension   . Yeast infection   . Abnormal Pap smear 2006  . Atrophic vaginitis 2008  . Dyspareunia 2008  . Dementia 2009  . Osteoporosis 2010  . Status post chemotherapy 2003    CARBOPLATIN/PACLITAXEL /STATUS POST CLINICAL TRAIL OF CELEBREX AND  IRESSA AT [facility] FOR 1 YEAR  . Status post radiation therapy 2003    LEFT LUNG  . Status post radiation therapy 11/07/2005    WHOLE BRAIN: DR Jill Alexanders [facility]  . Status post radiation therapy 06/02/2008    GAMMA KNIFE OF RESECTED CAVITAY  . On antineoplastic chemotherapy     TARCEVA  . Nodule of right lung CT- 06/03/12    RIGHT UPPER LOBE  . Thyroid  adenoma     ?  [name] Thyroid cancer 10/18/11 bx    adenoid nodules   . Anxiety   . Cataract   . Nodule of right lung 06/03/12    Upper Lobe  . Lung metastasis     PET scan 05/05/13, RUL lung nodule  . History of radiation therapy 07/28/13- 08/10/13    right lung metastasis 5000 cGy 10 sessions    ALLERGIES:  has No Known Allergies.  MEDICATIONS:  Current Outpatient Prescriptions  Medication Sig Dispense Refill  . alendronate (FOSAMAX) 70 MG tablet Take 1 tablet (70 mg total) by mouth every 7 (seven) days. Saturdays; Take with a full glass of water on an empty stomach.  4 tablet  12  . Ascorbic Acid (VITAMIN C PO) Take 1 tablet by mouth daily. Takes twice a wek      . aspirin EC 81 MG tablet Take 81 mg by mouth every other day.       . Cholecalciferol (VITAMIN D3) 2000 UNITS TABS Take 2,000 Int'l Units by mouth every other day.       . donepezil (ARICEPT) 10 MG tablet Take 1 tablet (10 mg total) by mouth daily.  90 tablet  0  . erlotinib (TARCEVA) 150 MG tablet Take 1 tablet (150 mg total) by mouth daily. Take on an empty stomach 1 hour before meals or 2 hours after.  30 tablet  2  . loperamide (IMODIUM) 2 MG capsule Take 2 mg by mouth 4 (four) times daily as needed for diarrhea or loose stools.      . Multiple Vitamin (MULITIVITAMIN WITH MINERALS) TABS Take 1 tablet by mouth daily.      . naphazoline-pheniramine (NAPHCON-A) 0.025-0.3 % ophthalmic solution Place 1 drop into both eyes 4 (four) times daily as needed. For dry eyes      . penicillin v potassium (VEETID) 500 MG tablet Take 500 mg by mouth daily. Pt only taking daily      . [DISCONTINUED] erlotinib (TARCEVA) 150 MG tablet Take 1 tablet (150 mg total) by mouth daily. Take on an empty stomach 1 hour before meals or 2 hours after.  30 tablet  2   No current facility-administered medications for this visit.    SURGICAL HISTORY:  Past Surgical History  Procedure Laterality Date  . Left occipital craniotomy  09/21/2005    tumor  .  Left lower lobectomy  05/2001  . Radical neck dissection  10/18/2011    Procedure: RADICAL NECK DISSECTION;  Surgeon: Serena Colonel, MD;  Location: [facility] OR;  Service: ENT;  Laterality: Right;  RIGHT MODIFIED NECK DISSECTION /POSSIBLE RIGHT THYROIDECTOM  . Thyroidectomy  10/18/2011    Procedure: THYROIDECTOMY WITH RADICAL NECK DISSECTION;  Surgeon: Serena Colonel, MD;  Location: [facility] OR;  Service: ENT;  Laterality: Right;    REVIEW OF SYSTEMS:  Constitutional: positive for fatigue Eyes: negative Ears, nose, mouth, throat, and face: negative Respiratory: negative Cardiovascular: negative Gastrointestinal: negative Genitourinary:negative Integument/breast: negative Hematologic/lymphatic: negative Musculoskeletal:negative Neurological: negative Behavioral/Psych: negative Endocrine: negative Allergic/Immunologic: negative   PHYSICAL  EXAMINATION: General appearance: alert, cooperative, fatigued and no distress Head: Normocephalic, without obvious abnormality, atraumatic Neck: no adenopathy Lymph nodes: Cervical, supraclavicular, and axillary nodes normal. Resp: clear to auscultation bilaterally Cardio: regular rate and rhythm, S1, S2 normal, no murmur, click, rub or gallop GI: soft, non-tender; bowel sounds normal; no masses,  no organomegaly Extremities: extremities normal, atraumatic, no cyanosis or edema Neurologic: Alert and oriented X 3, normal strength and tone. Normal symmetric reflexes. Normal coordination and gait  ECOG PERFORMANCE STATUS: 1 - Symptomatic but completely ambulatory  Blood pressure 115/60, pulse 86, temperature 97.4 F (36.3 C), temperature source Oral, resp. rate 20, height $RemoveBe'5\' 9"'yYpuvgzNz$  (1.753 m), weight 199 lb 12.8 oz (90.629 kg).  LABORATORY DATA: Lab Results  Component Value Date   WBC 6.1 09/17/2013   HGB 13.5 09/17/2013   HCT 40.9 09/17/2013   MCV 92.6 09/17/2013   PLT 261 09/17/2013      Chemistry      Component Value Date/Time   NA 143 09/17/2013 1016   NA 138  02/28/2012 0939   NA 140 01/23/2012 0941   K 3.6 09/17/2013 1016   K 4.6 02/28/2012 0939   K 3.6 01/23/2012 0941   CL 106 12/09/2012 0806   CL 99 02/28/2012 0939   CL 105 01/23/2012 0941   CO2 27 09/17/2013 1016   CO2 28 02/28/2012 0939   CO2 26 01/23/2012 0941   BUN 15.0 09/17/2013 1016   BUN 12 02/28/2012 0939   BUN 11 01/23/2012 0941   CREATININE 0.9 09/17/2013 1016   CREATININE 1.0 02/28/2012 0939   CREATININE 0.83 01/23/2012 0941      Component Value Date/Time   CALCIUM 9.8 09/17/2013 1016   CALCIUM 9.1 02/28/2012 0939   CALCIUM 9.5 01/23/2012 0941   ALKPHOS 76 09/17/2013 1016   ALKPHOS 80 02/28/2012 0939   ALKPHOS 72 01/23/2012 0941   AST 13 09/17/2013 1016   AST 20 02/28/2012 0939   AST 17 01/23/2012 0941   ALT 16 09/17/2013 1016   ALT 21 02/28/2012 0939   ALT 17 01/23/2012 0941   BILITOT 1.11 09/17/2013 1016   BILITOT 1.30 02/28/2012 0939   BILITOT 1.3* 01/23/2012 0941       RADIOGRAPHIC STUDIES: No results found.   ASSESSMENT AND PLAN: this is a very pleasant 68 years old African American female with metastatic non-small cell lung cancer adenocarcinoma, with positive EGFR mutation but also resistant mutation T790M. She has been on treatment with oral Tarceva for more than 6 years with no evidence for disease progression. She is currently undergoing palliative radiotherapy to the enlarging right upper lobe lung nodule which was the only site of disease progression on her recent scan. Patient was discussed with him also seen by Dr. Julien Nordmann. As she does have this resistant mutation, we will plan to do complete restaging CT scans in one month to include the neck, chest, abdomen and pelvis with contrast to reevaluate her disease. If the CT scans reveal evidence of further disease progression Dr. Julien Nordmann may refer the patient for a clinical trial verified with the intent his inhibitor that targets the resistant mutation T790M, likely AZD [id number] or [id number]. She will continue on Tarceva at 150 mg by mouth daily and  return in one month with a restaging CT scans as well as a repeat CBC differential and C. met.   She was advised to call immediately if she has any concerning symptoms in the interval.  The patient voices understanding of current disease  status and treatment options and is in agreement with the current care plan.  All questions were answered. The patient knows to call the clinic with any problems, questions or concerns. We can certainly see the patient much sooner if necessary.  Carlton Adam PA-C  ADDENDUM:  Hematology/Oncology Attending:  I had face to face encounter with the patient. I recommended her care plan. This is a very pleasant 68 years old African American female with metastatic non-small cell lung cancer with positive EGFR mutation in exon 19 in addition to both of resistant medication T790M. She is currently on Tarceva 150 mg by mouth daily status post 75 months of treatment  And tolerating her treatment fairly well. The patient and recently underwent palliative radiotherapy to the enlarging right upper lobe lung nodule under the care of Dr. Valere Dross.  I recommended for her to continue her current treatment with Tarceva. I would see her back for follow up visit in one month with repeat CT scan of the neck, chest, abdomen and pelvis for restaging of her disease. If she develop any further disease progression, I would consider referring the patient for clinical trial with AZD [id number] or [id number].  The patient agreed to the current plan. She was advised to call immediately if she has any concerning symptoms in the interval.  Disclaimer: This note was dictated with voice recognition software. Similar sounding words can inadvertently be transcribed and may not be corrected upon review. Eilleen Kempf., MD 09/19/2013

## 2013-09-18 NOTE — Patient Instructions (Signed)
Continue taking Tarceva 150 mg by mouth daily Followup with Dr. Julien Nordmann in one month with a restaging CT scan of your neck, chest, abdomen and pelvis to reevaluate your disease

## 2013-10-11 IMAGING — CR DG CHEST 2V
2 series · 2 of 2 positions shown · non-contrast
Comparison: 08/20/2011

CLINICAL DATA: Preop

CHEST - 2 VIEW

[view not recorded (1 of 2)]
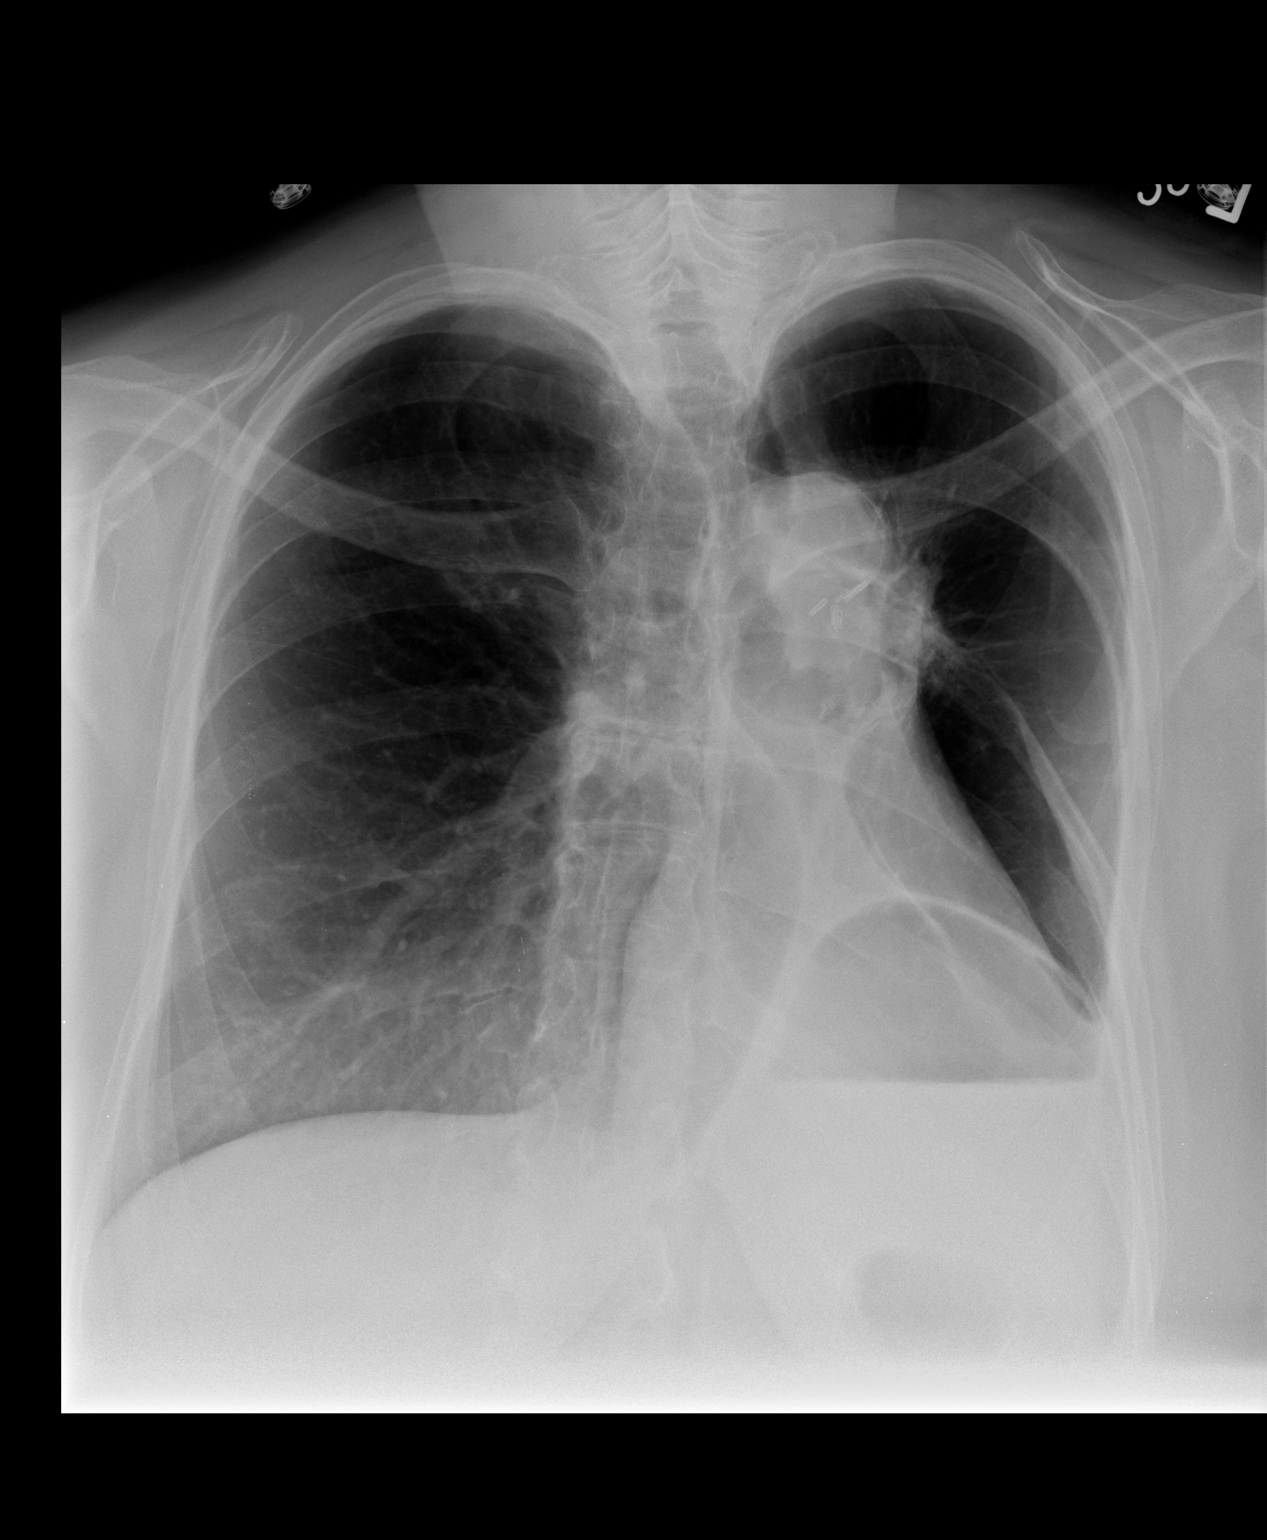

[view not recorded (2 of 2)]
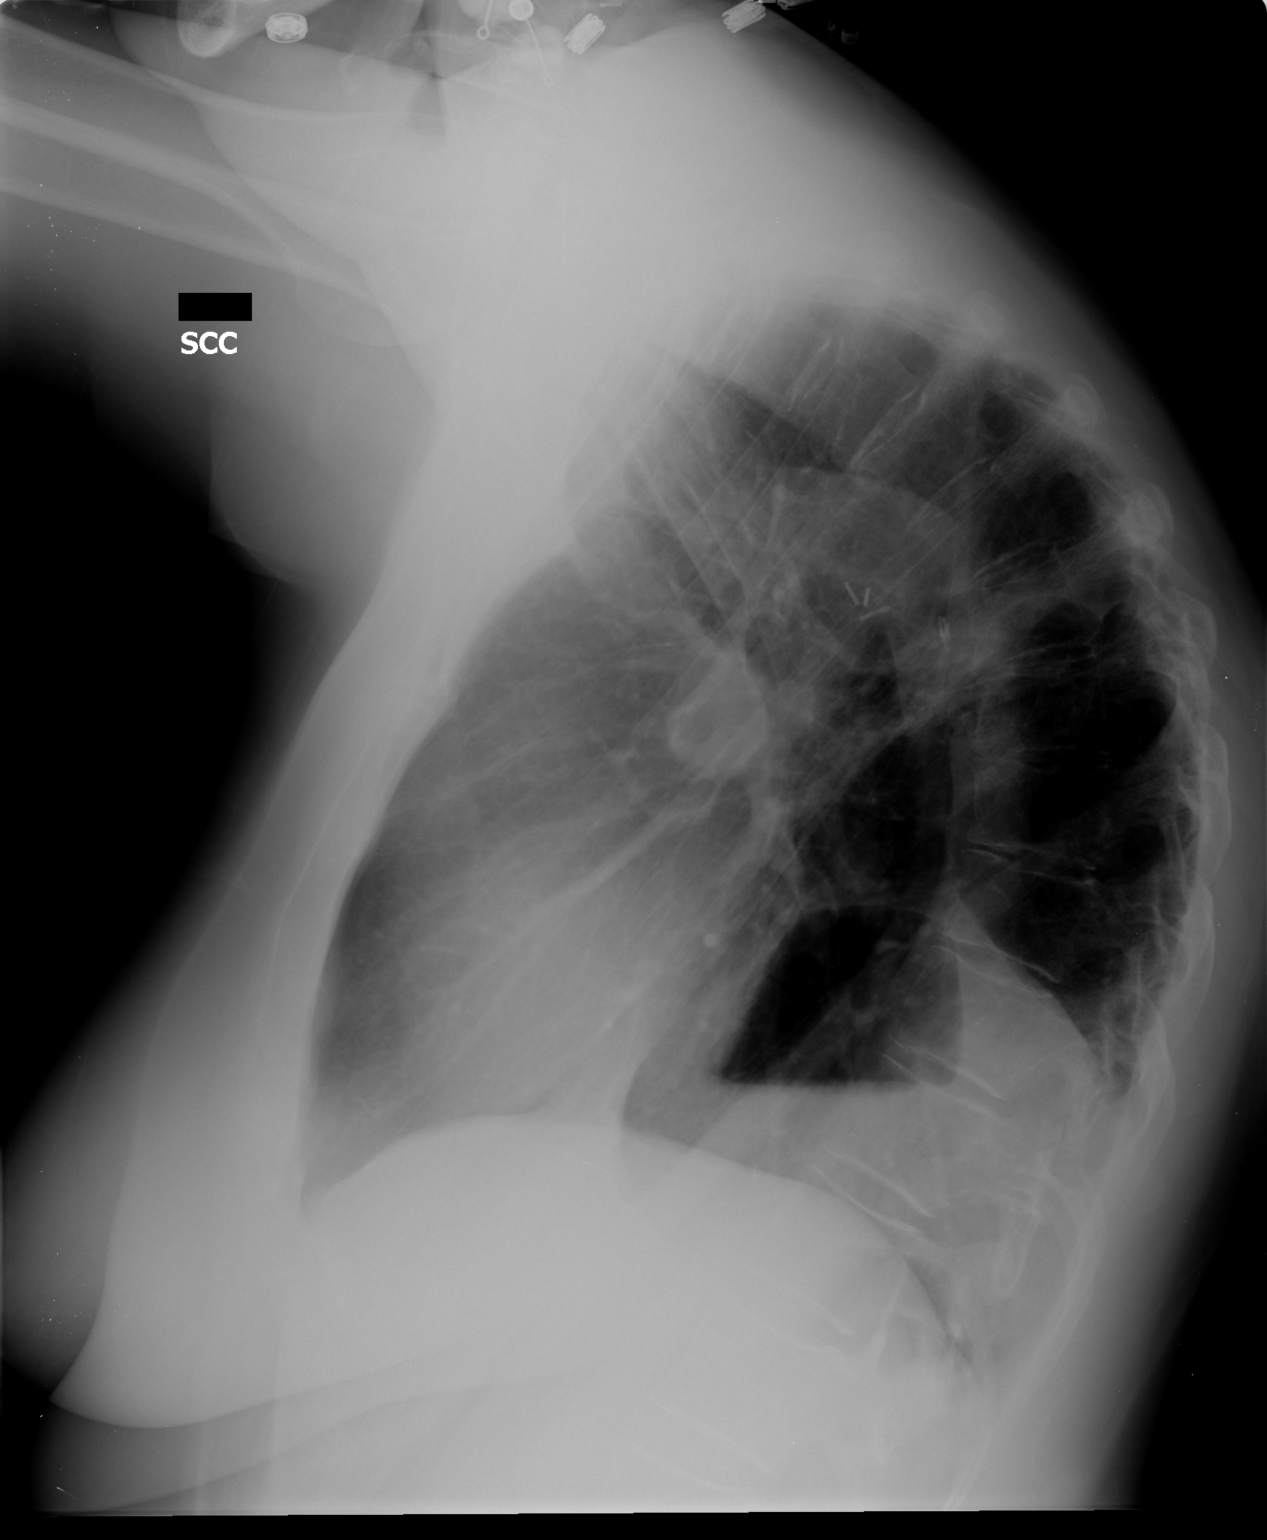

[2 of 2 positions shown; findings below may reference images not displayed]

FINDINGS: Cardiomediastinal silhouette is stable.  Again noted
status post left lower lobectomy.  Stable surgical clips in the
left hilum.  Stable left perihilar postradiation changes.  No acute
infiltrate or pleural effusion.  No pulmonary edema. Mild
degenerative changes mid thoracic spine.
IMPRESSION: No active disease.  Stable postsurgical and postradiation changes
left lung.

## 2013-10-15 IMAGING — CR DG CHEST 2V
2 series · 2 of 2 positions shown · non-contrast
Comparison: Chest radiograph 10/16/2011

CLINICAL DATA: Wheezing, postop.

CHEST - 2 VIEW

[w chest pa]
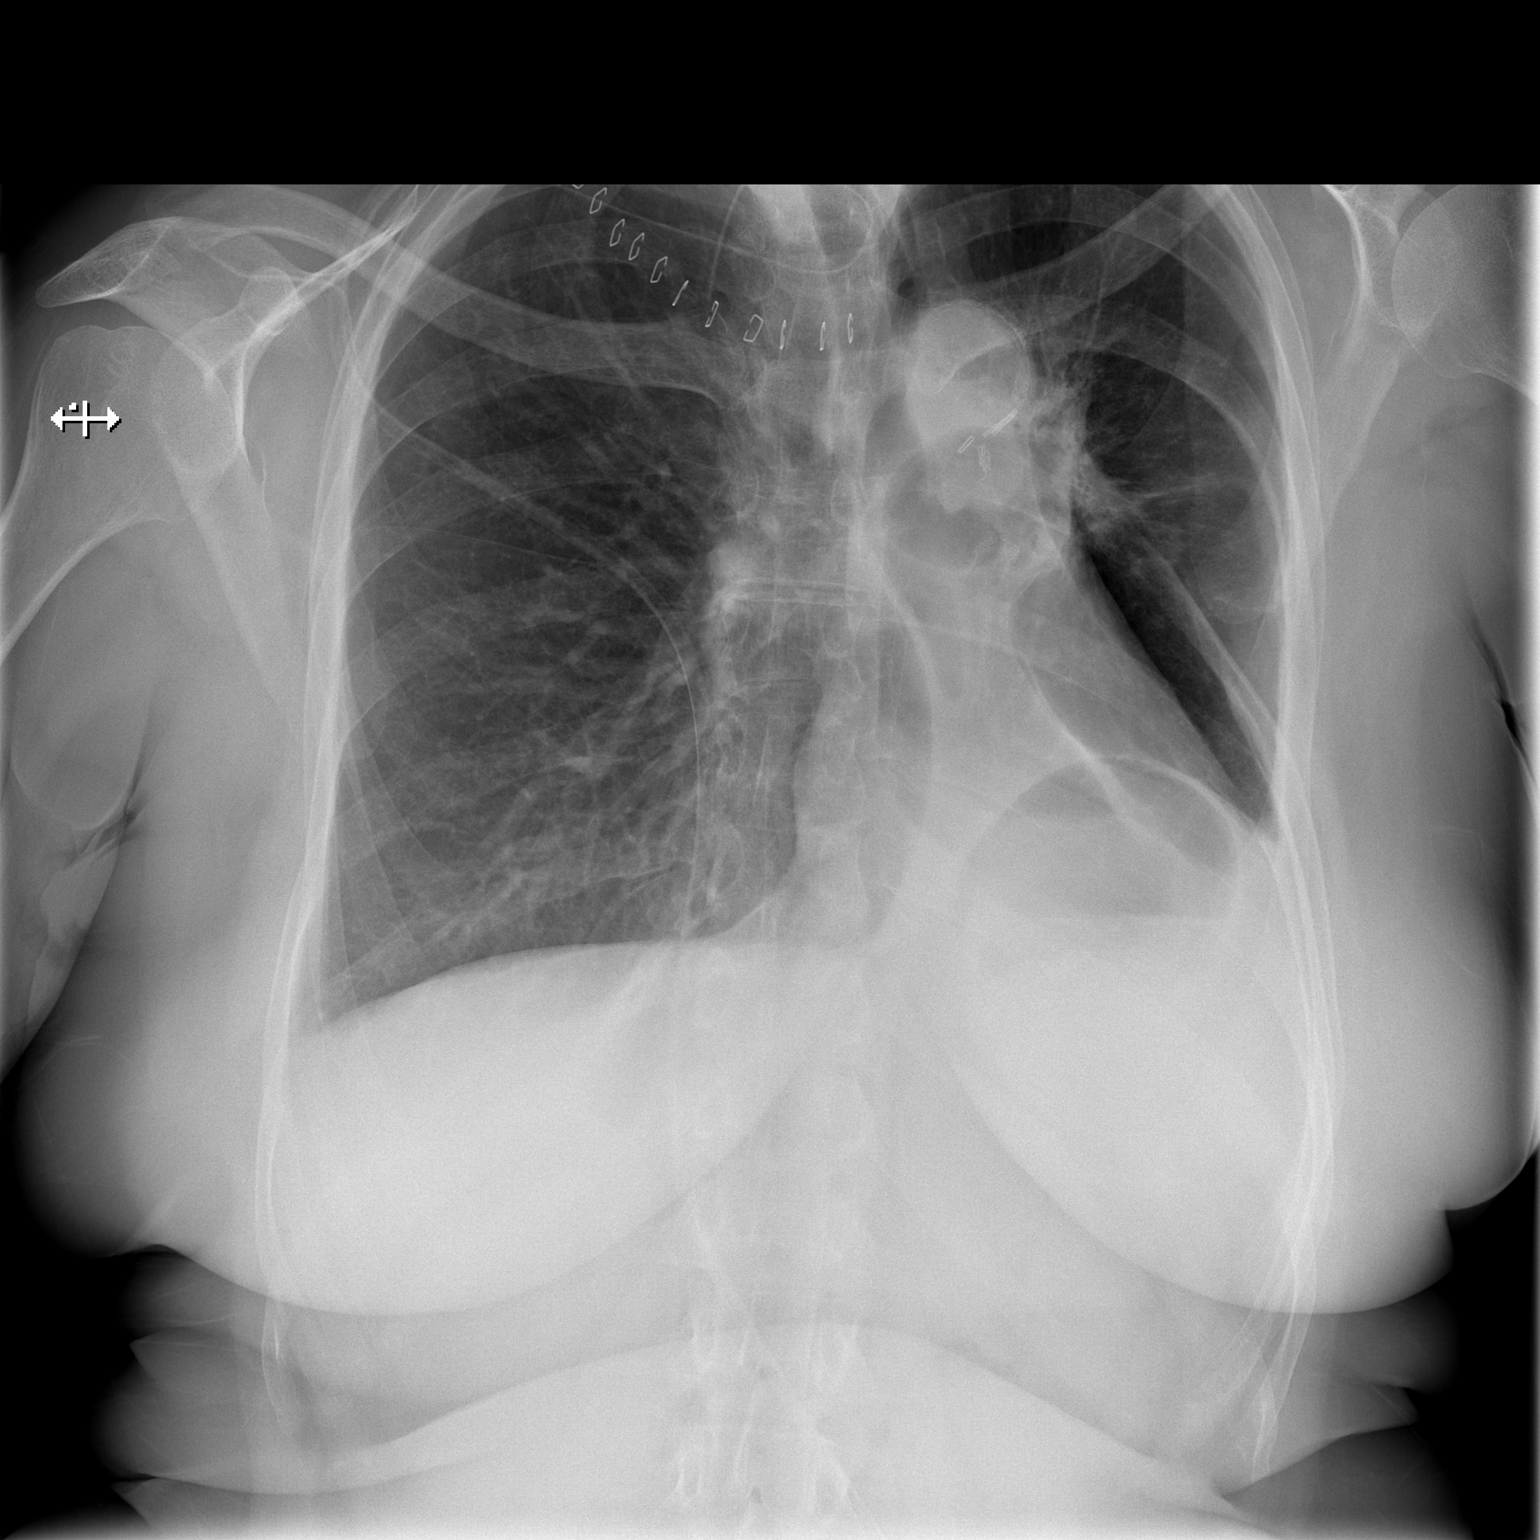

[w chest lat]
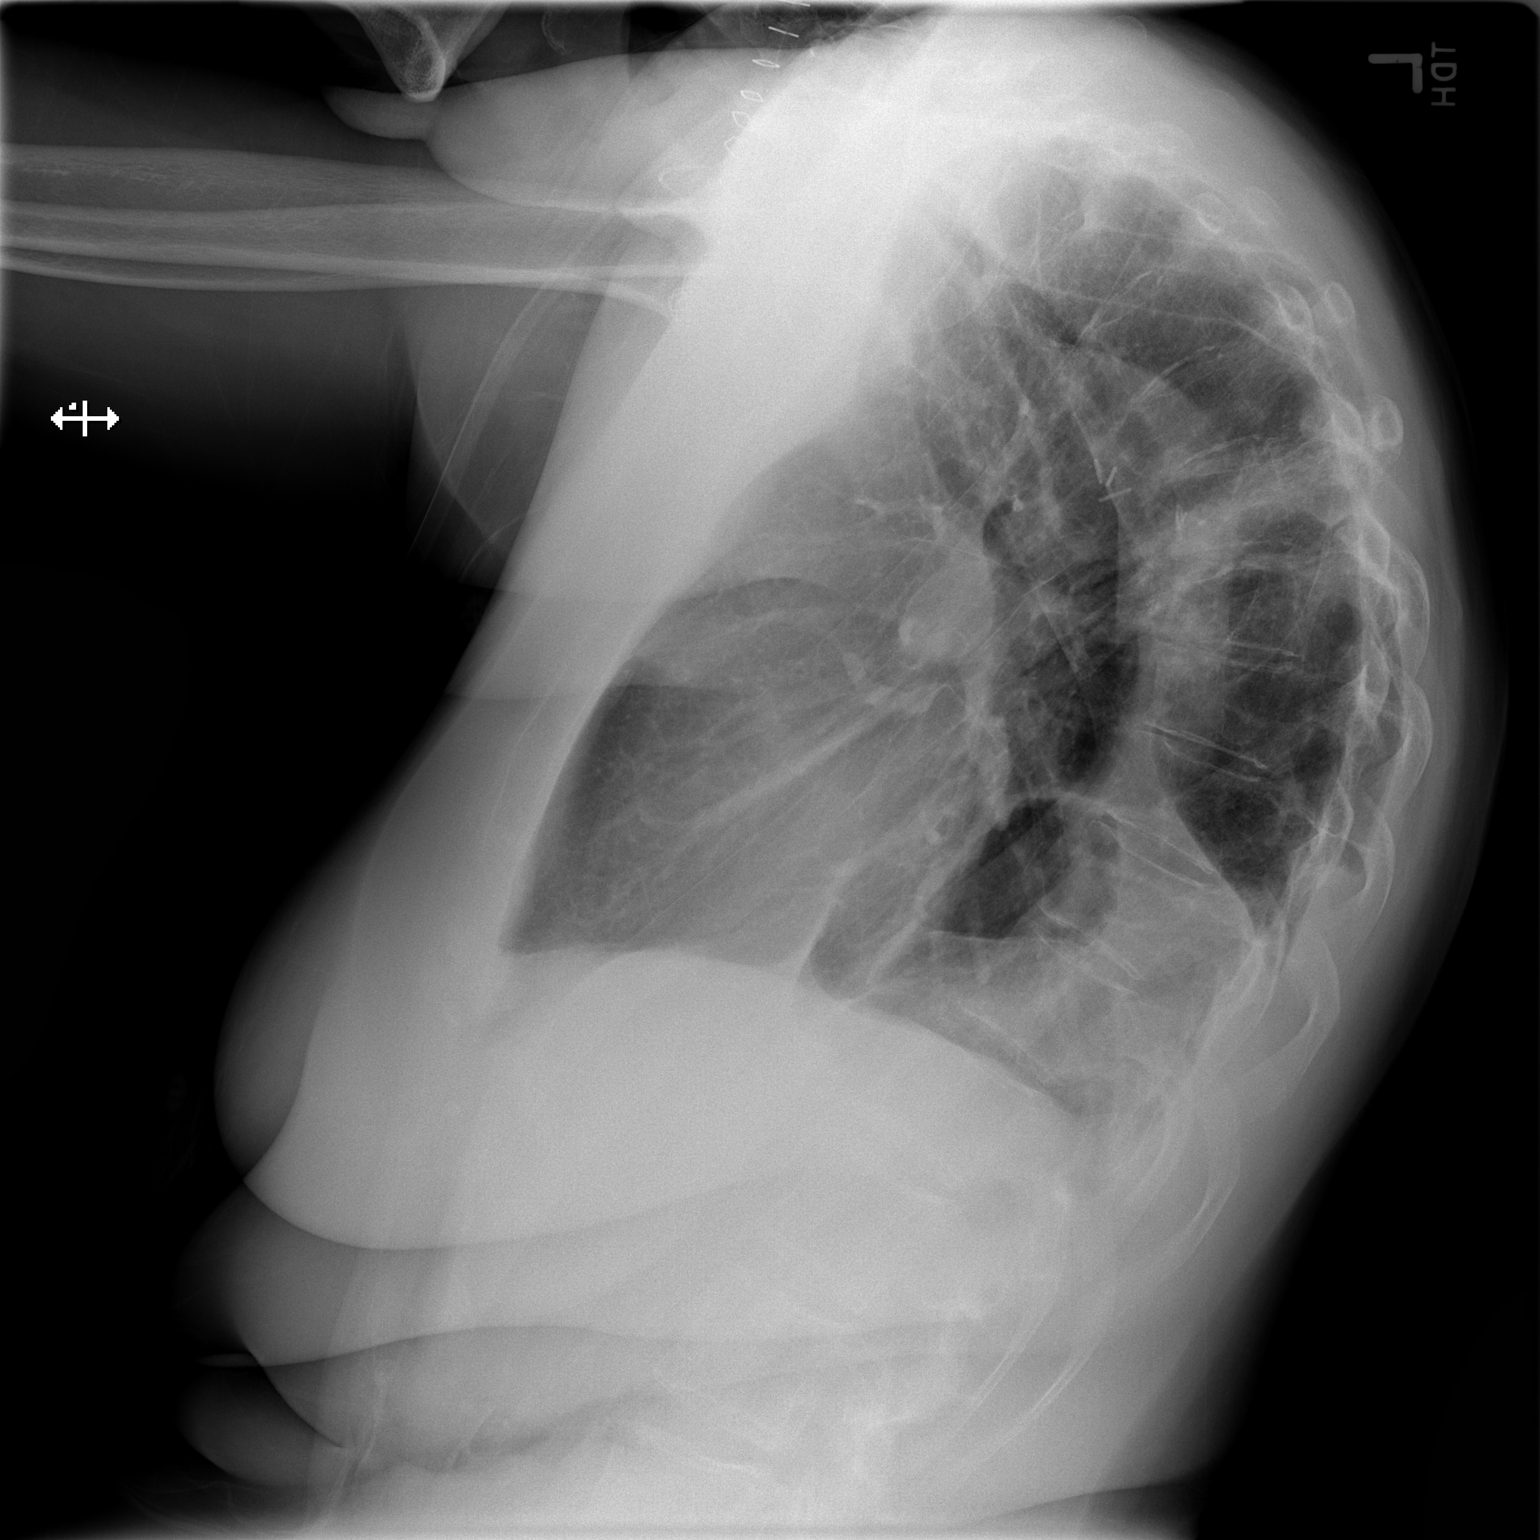

[2 of 2 positions shown; findings below may reference images not displayed]

FINDINGS: There is tubing overlying the right hemithorax with tip
projecting over the right lung apex.  Surgical skin staples project
over the right lung apex.  No evidence of pneumothorax on the
right. No evidence of pneumonia on the right.

There is postsurgical change in the left hemithorax which is
unchanged.
IMPRESSION: 1..  No complication following right neck surgery.
2.  Stable postop change in the left hemithorax.

## 2013-10-16 ENCOUNTER — Ambulatory Visit (HOSPITAL_COMMUNITY): Payer: Medicare Other

## 2013-10-16 ENCOUNTER — Telehealth: Payer: Self-pay | Admitting: Medical Oncology

## 2013-10-16 NOTE — Telephone Encounter (Addendum)
Pt r/s Ct to Monday . Thinks she needs to r/s f/u. i told her to keep all appts Monday  . I also told her CT results should be available for Dr Julien Nordmann at 1000.

## 2013-10-19 ENCOUNTER — Telehealth: Payer: Self-pay | Admitting: Internal Medicine

## 2013-10-19 ENCOUNTER — Ambulatory Visit (HOSPITAL_COMMUNITY)
Admission: RE | Admit: 2013-10-19 | Discharge: 2013-10-19 | Disposition: A | Discharge status: Home / Self Care | Payer: Medicare Other | Source: Ambulatory Visit | Attending: Physician Assistant | Admitting: Physician Assistant

## 2013-10-19 ENCOUNTER — Encounter: Payer: Self-pay | Admitting: Internal Medicine

## 2013-10-19 ENCOUNTER — Ambulatory Visit (HOSPITAL_BASED_OUTPATIENT_CLINIC_OR_DEPARTMENT_OTHER): Payer: Medicare Other | Admitting: Internal Medicine

## 2013-10-19 ENCOUNTER — Other Ambulatory Visit: Payer: Medicare Other

## 2013-10-19 ENCOUNTER — Other Ambulatory Visit (HOSPITAL_BASED_OUTPATIENT_CLINIC_OR_DEPARTMENT_OTHER): Payer: Medicare Other

## 2013-10-19 ENCOUNTER — Encounter (HOSPITAL_COMMUNITY): Payer: Self-pay

## 2013-10-19 VITALS — BP 129/71 | HR 94 | Temp 98.4°F | Resp 20 | Ht 69.0 in | Wt 199.2 lb

## 2013-10-19 DIAGNOSIS — C770 Secondary and unspecified malignant neoplasm of lymph nodes of head, face and neck: Secondary | ICD-10-CM

## 2013-10-19 DIAGNOSIS — C3430 Malignant neoplasm of lower lobe, unspecified bronchus or lung: Secondary | ICD-10-CM

## 2013-10-19 DIAGNOSIS — C7931 Secondary malignant neoplasm of brain: Secondary | ICD-10-CM

## 2013-10-19 DIAGNOSIS — C3490 Malignant neoplasm of unspecified part of unspecified bronchus or lung: Secondary | ICD-10-CM

## 2013-10-19 DIAGNOSIS — C7949 Secondary malignant neoplasm of other parts of nervous system: Secondary | ICD-10-CM | POA: Diagnosis not present

## 2013-10-19 DIAGNOSIS — C7951 Secondary malignant neoplasm of bone: Secondary | ICD-10-CM | POA: Diagnosis not present

## 2013-10-19 LAB — CBC WITH DIFFERENTIAL/PLATELET
BASO%: 0.7 % (ref 0.0–2.0)
Basophils Absolute: 0 10*3/uL (ref 0.0–0.1)
EOS%: 7.9 % — ABNORMAL HIGH (ref 0.0–7.0)
Eosinophils Absolute: 0.4 10*3/uL (ref 0.0–0.5)
HCT: 38.5 % (ref 34.8–46.6)
HGB: 12.6 g/dL (ref 11.6–15.9)
LYMPH%: 15.6 % (ref 14.0–49.7)
MCH: 30 pg (ref 25.1–34.0)
MCHC: 32.7 g/dL (ref 31.5–36.0)
MCV: 91.7 fL (ref 79.5–101.0)
MONO#: 0.5 10*3/uL (ref 0.1–0.9)
MONO%: 10 % (ref 0.0–14.0)
NEUT#: 3 10*3/uL (ref 1.5–6.5)
NEUT%: 65.8 % (ref 38.4–76.8)
Platelets: 234 10*3/uL (ref 145–400)
RBC: 4.2 10*6/uL (ref 3.70–5.45)
RDW: 14.4 % (ref 11.2–14.5)
WBC: 4.6 10*3/uL (ref 3.9–10.3)
lymph#: 0.7 10*3/uL — ABNORMAL LOW (ref 0.9–3.3)

## 2013-10-19 LAB — COMPREHENSIVE METABOLIC PANEL (CC13)
ALT: 16 U/L (ref 0–55)
AST: 17 U/L (ref 5–34)
Albumin: 3.8 g/dL (ref 3.5–5.0)
Alkaline Phosphatase: 66 U/L (ref 40–150)
Anion Gap: 8 mEq/L (ref 3–11)
BUN: 11.8 mg/dL (ref 7.0–26.0)
CO2: 27 mEq/L (ref 22–29)
Calcium: 9.4 mg/dL (ref 8.4–10.4)
Chloride: 104 mEq/L (ref 98–109)
Creatinine: 0.8 mg/dL (ref 0.6–1.1)
Glucose: 102 mg/dl (ref 70–140)
Potassium: 3.8 mEq/L (ref 3.5–5.1)
Sodium: 139 mEq/L (ref 136–145)
Total Bilirubin: 1.05 mg/dL (ref 0.20–1.20)
Total Protein: 6.7 g/dL (ref 6.4–8.3)

## 2013-10-19 MED ORDER — IOHEXOL 300 MG/ML  SOLN
50.0000 mL | Freq: Once | INTRAMUSCULAR | Status: AC | PRN
  Administered 2013-10-19: 50 mL via ORAL

## 2013-10-19 MED ORDER — IOHEXOL 300 MG/ML  SOLN
125.0000 mL | Freq: Once | INTRAMUSCULAR | Status: AC | PRN
  Administered 2013-10-19: 125 mL via INTRAVENOUS

## 2013-10-19 NOTE — Progress Notes (Signed)
[place] Telephone:([phone]) [phone]   Fax:(336) [phone]  OFFICE PROGRESS NOTE  [name], MD 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  BI[facility]ARK[facility] TESTING ([facility]): Positive for: EGFR E746-A750 del, T790M, CTNNB1 D32G, SMAD4 T147f*10 Negative for: RET, ALK, BRAF, KRAS, ERBB2 and MET  PRIOR THERAPY:  1. Status post left lower lobectomy under the care of Dr BArlyce Dicein October 2002. 2. Status post course of concurrent chemoradiation with weekly carboplatin and paclitaxel under the care of Dr SBenay Spiceand Dr WElba Barmanin early 2003. 3. Status post treatment [facility] 4. Status post left occipital craniotomy for tumor resection on September 21, 2005, under the care of Dr CChristella Noa 5. Status post whole brain irradiation under the care of Dr WElba Barman completed November 07, 2005. 6. Status post gamma knife treatment to the resected cavitary [facility] 7. Status post right thyroidectomy with radical neck dissection under the care of Dr. RConstance Holsteron 10/18/2011. 8. Status post excisional biopsy of right cervical lymph node that was consistent with metastatic adenocarcinoma.  CURRENT THERAPY:  1) Tarceva 150 mg by mouth daily with therapy beginning 06/03/2007. 2) palliative radiotherapy to the enlarging right upper lobe lung nodule under the care of Dr. MValere Dross  CHEMOTHERAPY INTENT: palliative  CURRENT # OF CHEMOTHERAPY CYCLES: 76  CURRENT ANTIEMETICS: Compazine when necessary  CURRENT SMOKING STATUS: nonsmoker  ORAL CHEMOTHERAPY AND CONSENT: Tarceva  CURRENT BISPHOSPHONATES USE: None  PAIN MANAGEMENT: None  NARCOTICS INDUCED CONSTIPATION: None  LIVING WILL AND CODE STATUS: Full  code  INTERVAL HISTORY: Valerie RUBERG89y.o. female returns to the clinic today for three-month follow up visit. The patient is feeling fine today with no specific complaints except for increasing fatigue. She denied having any significant chest pain, shortness of breath, cough or hemoptysis. The patient denied having any nausea or vomiting. She had no weight loss or night sweats. She is tolerating her treatment with Tarceva fairly well with no significant skin rash but occasional episodes of diarrhea. The patient is here today for evaluation and discussion of her treatment options. She had repeat CT scan of the neck, chest, abdomen and pelvis performed recently and she is here for evaluation and discussion of her scan results.   MEDICAL HISTORY: Past Medical History  Diagnosis Date  . Heart murmur   . Shortness of breath     hx lung ca   . Arthritis   . Lung cancer dx'd 2002  . Metastasis to brain dx'd 2008  . 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 Anal fistula   . H/O osteoporosis   . H/O vitamin D deficiency   . History of measles, mumps, or rubella   . H/O varicella   . Hypertension   . Yeast infection   . Abnormal Pap smear 2006  . Atrophic vaginitis 2008  . Dyspareunia 2008  . Dementia 2009  . Osteoporosis 2010  . Status post chemotherapy 2003    CARBOPLATIN/PACLITAXEL /STATUS POST CLINICAL TRAIL OF CELEBREX AND IRESSA AT [facility] FOR 1 YEAR  . Status post radiation therapy 2003    LEFT LUNG  . Status post radiation therapy 11/07/2005    WHOLE BRAIN: DR [facility]Larkin [facility]  . Status post radiation  therapy 06/02/2008    GAMMA KNIFE OF RESECTED CAVITAY  . On antineoplastic chemotherapy     TARCEVA  . Nodule of right lung CT- 06/03/12    RIGHT UPPER LOBE  . Thyroid adenoma     ?  [name] Thyroid cancer 10/18/11 bx    adenoid nodules   . Anxiety   . Cataract   . Nodule of right lung 06/03/12    Upper Lobe  . Lung metastasis     PET scan 05/05/13, RUL lung nodule  . History of  radiation therapy 07/28/13- 08/10/13    right lung metastasis 5000 cGy 10 sessions    ALLERGIES:  has No Known Allergies.  MEDICATIONS:  Current Outpatient Prescriptions  Medication Sig Dispense Refill  . alendronate (FOSAMAX) 70 MG tablet Take 1 tablet (70 mg total) by mouth every 7 (seven) days. Saturdays; Take with a full glass of water on an empty stomach.  4 tablet  12  . Ascorbic Acid (VITAMIN C PO) Take 1 tablet by mouth daily. Takes twice a wek      . aspirin EC 81 MG tablet Take 81 mg by mouth every other day.       . Cholecalciferol (VITAMIN D3) 2000 UNITS TABS Take 2,000 Int'l Units by mouth every other day.       . donepezil (ARICEPT) 10 MG tablet Take 1 tablet (10 mg total) by mouth daily.  90 tablet  0  . erlotinib (TARCEVA) 150 MG tablet Take 1 tablet (150 mg total) by mouth daily. Take on an empty stomach 1 hour before meals or 2 hours after.  30 tablet  2  . loperamide (IMODIUM) 2 MG capsule Take 2 mg by mouth 4 (four) times daily as needed for diarrhea or loose stools.      . Multiple Vitamin (MULITIVITAMIN WITH MINERALS) TABS Take 1 tablet by mouth daily.      . naphazoline-pheniramine (NAPHCON-A) 0.025-0.3 % ophthalmic solution Place 1 drop into both eyes 4 (four) times daily as needed. For dry eyes      . [DISCONTINUED] erlotinib (TARCEVA) 150 MG tablet Take 1 tablet (150 mg total) by mouth daily. Take on an empty stomach 1 hour before meals or 2 hours after.  30 tablet  2   No current facility-administered medications for this visit.    SURGICAL HISTORY:  Past Surgical History  Procedure Laterality Date  . Left occipital craniotomy  09/21/2005    tumor  . Left lower lobectomy  05/2001  . Radical neck dissection  10/18/2011    Procedure: RADICAL NECK DISSECTION;  Surgeon: Izora Gala, MD;  Location: [facility];  Service: ENT;  Laterality: Right;  RIGHT MODIFIED NECK DISSECTION /POSSIBLE RIGHT THYROIDECTOM  . Thyroidectomy  10/18/2011    Procedure: THYROIDECTOMY WITH RADICAL  NECK DISSECTION;  Surgeon: Izora Gala, MD;  Location: [facility];  Service: ENT;  Laterality: Right;    REVIEW OF SYSTEMS:  Constitutional: negative Eyes: negative Ears, nose, mouth, throat, and face: negative Respiratory: negative Cardiovascular: negative Gastrointestinal: negative Genitourinary:negative Integument/breast: negative Hematologic/lymphatic: negative Musculoskeletal:negative Neurological: negative Behavioral/Psych: negative Endocrine: negative Allergic/Immunologic: negative   PHYSICAL EXAMINATION: General appearance: alert, cooperative, fatigued and no distress Head: Normocephalic, without obvious abnormality, atraumatic Neck: no adenopathy Lymph nodes: Cervical, supraclavicular, and axillary nodes normal. Resp: clear to auscultation bilaterally Cardio: regular rate and rhythm, S1, S2 normal, no murmur, click, rub or gallop GI: soft, non-tender; bowel sounds normal; no masses,  no organomegaly Extremities: extremities normal, atraumatic, no cyanosis or  edema Neurologic: Alert and oriented X 3, normal strength and tone. Normal symmetric reflexes. Normal coordination and gait  ECOG PERFORMANCE STATUS: 1 - Symptomatic but completely ambulatory  Blood pressure 129/71, pulse 94, temperature 98.4 F (36.9 C), temperature source Oral, resp. rate 20, height _0  (1.753 m), weight 199 lb 3.2 oz (90.357 kg), SpO2 99.00%.  LABORATORY DATA: Lab Results  Component Value Date   WBC 4.6 10/19/2013   HGB 12.6 10/19/2013   HCT 38.5 10/19/2013   MCV 91.7 10/19/2013   PLT 234 10/19/2013      Chemistry      Component Value Date/Time   NA 139 10/19/2013 0759   NA 138 02/28/2012 0939   NA 140 01/23/2012 0941   K 3.8 10/19/2013 0759   K 4.6 02/28/2012 0939   K 3.6 01/23/2012 0941   CL 106 12/09/2012 0806   CL 99 02/28/2012 0939   CL 105 01/23/2012 0941   CO2 27 10/19/2013 0759   CO2 28 02/28/2012 0939   CO2 26 01/23/2012 0941   BUN 11.8 10/19/2013 0759   BUN 12 02/28/2012 0939   BUN 11 01/23/2012 0941     CREATININE 0.8 10/19/2013 0759   CREATININE 1.0 02/28/2012 0939   CREATININE 0.83 01/23/2012 0941      Component Value Date/Time   CALCIUM 9.4 10/19/2013 0759   CALCIUM 9.1 02/28/2012 0939   CALCIUM 9.5 01/23/2012 0941   ALKPHOS 66 10/19/2013 0759   ALKPHOS 80 02/28/2012 0939   ALKPHOS 72 01/23/2012 0941   AST 17 10/19/2013 0759   AST 20 02/28/2012 0939   AST 17 01/23/2012 0941   ALT 16 10/19/2013 0759   ALT 21 02/28/2012 0939   ALT 17 01/23/2012 0941   BILITOT 1.05 10/19/2013 0759   BILITOT 1.30 02/28/2012 0939   BILITOT 1.3* 01/23/2012 0941       RADIOGRAPHIC STUDIES: Ct Soft Tissue Neck W Contrast  10/19/2013   CLINICAL DATA:  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  CONTRAST:  174m OMNIPAQUE IOHEXOL 300 MG/ML  SOLN  COMPARISON:  PET-CT from 05/05/2013. Neck and chest CT from 04/01/2013. Neck, chest, abdomen and pelvis CT from 12/09/2012.  FINDINGS:   CT NECK FINDINGS  The patient had a hypermetabolic level V right cervical lymph node on the previous PET-CT. Given the differential arm positions between the previous 04/01/2013 exam, the subsequent PET-CT, and today's CT, this area projects in a different location of all 3 exams. On today's study there is no lymphadenopathy in the lower right neck. The previous lymph node of concern is not discretely identified, but is probably on image 102 of series 7 Pain confluent with venous and arterial anatomy. It is difficult to  discretely visualize secondary to bolus timing and incomplete opacification of the venous blood pool.  The right thyroid lobe is surgically absent. Scattered tiny lymph nodes are seen in the neck bilaterally, but are not pathologically enlarged.  Bone windows reveal no worrisome lytic or sclerotic osseous lesions. Craniotomy defect is identified in the left posterior skull.  CT CHEST FINDINGS  There is no axillary lymphadenopathy. No mediastinal lymphadenopathy. Abnormal a amorphous soft tissue attenuation in the left inferior hilar region is stable. There is a tiny left pleural effusion as before.  Pulmonary venous anatomy is compatible with previous left lower lobectomy. The a 4 mm right upper lobe nodule on image 21 is unchanged. Heart size is normal. There is trace pericardial fluid, stable.  The previous identified hypermetabolic right upper lobe pulmonary nodule has decreased in size, measuring 7 mm today compared to 13 mm on the PET-CT. Previously measured 4 mm left upper lobe nodule is seen on image 17 today of series 5 and is unchanged at 4 mm. A 7 mm nodule in the lower left lung on image 30 was 9 mm previously. There a 7 mm nodule at the left base on image 32 which was 6 mm previously.  Bone windows reveal no worrisome lytic or sclerotic osseous lesions.    CT ABDOMEN AND PELVIS FINDINGS  No focal abnormality is seen in the liver or spleen. The stomach, duodenum, pancreas, gallbladder, and right adrenal gland are unremarkable. Thickening of the left adrenal gland is unchanged without adrenal nodule evident. Right kidney is normal. 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  No abdominal aortic aneurysm. No free fluid or lymphadenopathy in the abdomen.  Imaging through the pelvis shows no free intraperitoneal fluid. There is no pelvic sidewall lymphadenopathy. Bladder is  decompressed. Uterus is unremarkable. No evidence for adnexal mass. The terminal ileum is normal. The appendix is normal.  Bone windows reveal no worrisome lytic or sclerotic osseous lesions.    IMPRESSION: 1. The right cervical level 5 lymph node is not discretely identified on today's study. I think it is blending in with a venous confluence in the lower right neck. This lymph node is likely not substantially changed in the interval. 2. The 13 mm right upper lobe hypermetabolic pulmonary nodule has decreased in size, now measuring 7 mm. 3. Scattered other tiny bilateral pulmonary parenchymal nodules are unchanged. 4. No new or progressive disease in the neck, chest, abdomen, or pelvis.   Electronically Signed   By: Misty Stanley M.D.   On: 10/19/2013 10:37    ASSESSMENT AND PLAN: this is a very pleasant 68 years old [place] American female with metastatic non-small cell lung cancer adenocarcinoma, with positive EGFR mutation but also resistant mutation T790M. She has been on treatment with oral Tarceva for more than 6 years with no evidence for disease progression.  Her recent scan showed no significant evidence for disease progression and right cervical level V lymph node was not discretely identified on the today's study. There was also decrease in the size of the right upper lobe pulmonary nodule. I discussed the scan results with the patient today. In the absence of any disease progression, I recommended for the patient to continue her current treatment with Tarceva 150 mg by mouth daily. It looks like the palliative radiotherapy did a positive effect on the progressive nodules with the EGFR resistant mutation. I will see the patient back for followup visit in 6 weeks with repeat CBC and comprehensive metabolic panel. She would have repeat CT scan again in 3 months for further evaluation of her disease.  Definitely if she has any other evidence for disease progression on the upcoming scan, I may  refer the patient for a clinical trial  verified with the intent his inhibitor that targets the resistant mutation T790M, likely [id number] or [id number].  She was advised to call immediately if she has any concerning symptoms in the interval.  The patient voices understanding of current disease status and treatment options and is in agreement with the current care plan.  All questions were answered. The patient knows to call the clinic with any problems, questions or concerns. We can certainly see the patient much sooner if necessary.  I spent 15 minutes counseling the patient face to face. The total time spent in the appointment was 25 minutes.  Disclaimer: This note was dictated with voice recognition software. Similar sounding words can inadvertently be transcribed and may not be corrected upon review.

## 2013-10-19 NOTE — Telephone Encounter (Signed)
, °

## 2013-10-22 DIAGNOSIS — G3184 Mild cognitive impairment, so stated: Secondary | ICD-10-CM | POA: Diagnosis not present

## 2013-11-06 ENCOUNTER — Other Ambulatory Visit: Payer: Self-pay | Admitting: Medical Oncology

## 2013-11-06 DIAGNOSIS — C3490 Malignant neoplasm of unspecified part of unspecified bronchus or lung: Secondary | ICD-10-CM

## 2013-11-06 MED ORDER — ERLOTINIB HCL 150 MG PO TABS: 150.0000 mg | ORAL_TABLET | Freq: Every day | ORAL | Status: DC

## 2013-11-06 NOTE — Telephone Encounter (Signed)
tarceva refilled faxed to [facility].

## 2013-11-06 NOTE — Telephone Encounter (Signed)
RECEIVED A FAX FROM [facility] CONCERNING A CONFIRMATION OF FACSIMILE RECEIPT FOR PT. REFERRAL. 

## 2013-11-12 DIAGNOSIS — K602 Anal fissure, unspecified: Secondary | ICD-10-CM | POA: Diagnosis not present

## 2013-11-12 DIAGNOSIS — K625 Hemorrhage of anus and rectum: Secondary | ICD-10-CM | POA: Diagnosis not present

## 2013-11-12 DIAGNOSIS — K5900 Constipation, unspecified: Secondary | ICD-10-CM | POA: Diagnosis not present

## 2013-11-12 DIAGNOSIS — K5730 Diverticulosis of large intestine without perforation or abscess without bleeding: Secondary | ICD-10-CM | POA: Diagnosis not present

## 2013-11-24 ENCOUNTER — Ambulatory Visit: Payer: Medicare Other | Admitting: Radiation Oncology

## 2013-12-01 ENCOUNTER — Encounter: Payer: Self-pay | Admitting: Radiation Oncology

## 2013-12-01 ENCOUNTER — Other Ambulatory Visit: Payer: Medicare Other

## 2013-12-01 ENCOUNTER — Ambulatory Visit: Payer: Medicare Other | Admitting: Internal Medicine

## 2013-12-01 ENCOUNTER — Telehealth: Payer: Self-pay | Admitting: Internal Medicine

## 2013-12-01 ENCOUNTER — Ambulatory Visit
Admission: RE | Admit: 2013-12-01 | Discharge: 2013-12-01 | Disposition: A | Discharge status: Home / Self Care | Payer: Medicare Other | Source: Ambulatory Visit | Attending: Radiation Oncology | Admitting: Radiation Oncology

## 2013-12-01 VITALS — BP 126/81 | HR 97 | Temp 97.5°F | Resp 20 | Wt 195.4 lb

## 2013-12-01 DIAGNOSIS — C3490 Malignant neoplasm of unspecified part of unspecified bronchus or lung: Secondary | ICD-10-CM

## 2013-12-01 NOTE — Progress Notes (Signed)
Followup note:  Valerie [name] visits today almost 4 months following completion of radiosurgery in the management of her metastatic non-small cell carcinoma to the right lung. She was treated with hypofractionated treatment rather than classic 3-5 fraction SBRT because of the proximity of her metastasis to the proximal right bronchi. She was seen by Dr. Julien Nordmann in early March at which time she had restaging CT scans. Her CT scan of the chest showed that her radiosurgery treated right upper lobe pulmonary nodule decreased in size from 1.3 cm back in September 2014 down to 0.7 cm (a volume reduction of 80%). All other nodules have regressed are unchanged. His plan is to continue with Tarceva as prescribed. She is without complaints today. She seen today with the [facility].  Physical examination: She looks well. She is in no respiratory distress. Filed Vitals:   12/01/13 1119  BP: 126/81  Pulse: 97  Temp: 97.5 F (36.4 C)  Resp: 20   Head and neck examination: Grossly unremarkable. Nodes: Without palpable cervical or supraclavicular lymphadenopathy. Chest: Lungs clear.  Laboratory data: Lab Results  Component Value Date   WBC 4.6 10/19/2013   HGB 12.6 10/19/2013   HCT 38.5 10/19/2013   MCV 91.7 10/19/2013   PLT 234 10/19/2013   Impression: Satisfactory progress with favorable tumor regression (80% by volume) of her right lung metastasis. I expect further regression over time.  Plan: Followup through Dr. Earlie Server. I asked that Dr. Earlie Server keep me posted on her progress.

## 2013-12-01 NOTE — Progress Notes (Signed)
Pt denies pain today, has occasional dry cough, SOB w/exertion. Pt denies fatigue, loss of appetite. Pt to review 10/19/13 ct chest results w/Dr Valere Dross today.

## 2013-12-01 NOTE — Telephone Encounter (Signed)
Pt came by and r/s app to monday 2015 MD and lab

## 2013-12-05 IMAGING — CT CT ABD-PELV W/ CM
3 of 11 series · 16 of 46 positions shown, 18 images · IV contrast ([ID] [id number])
Comparison: PET CT scan [DATE] 4827

CT CHEST

CLINICAL DATA: Metastatic non-small cell lung cancer with brain
metastasis.  Initial diagnosis of stage IIB in 7117.  Patient
status post left lower lobectomy. Right supraclavicular nodal
metastasis with recent surgery.

CT CHEST, ABDOMEN AND PELVIS WITH CONTRAST
TECHNIQUE: Multidetector CT imaging of the chest, abdomen and
pelvis was performed following the standard protocol during bolus
administration of intravenous contrast.
Contrast: 125mL OMNIPAQUE IOHEXOL 300 MG/ML  SOLN

[Series 5: cap st · axial · 0.78mm/px · z∈[-813,-308]mm · 7 of 135 slices shown, 9 images]
[im 17/135  soft-tissue]
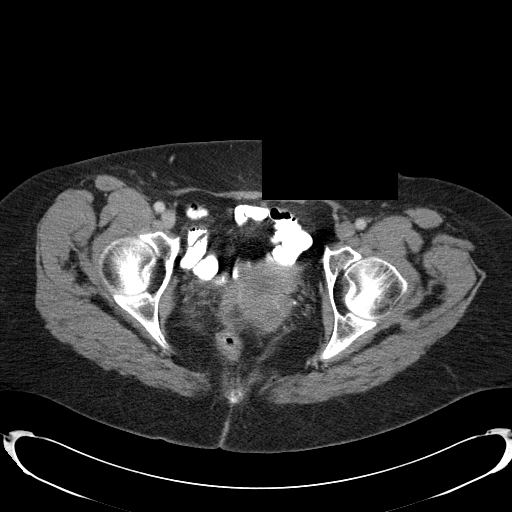
[im 17/135  bone]
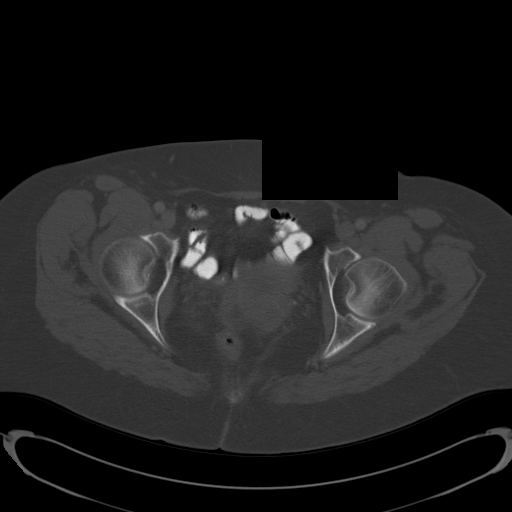
[im 34/135  bone]
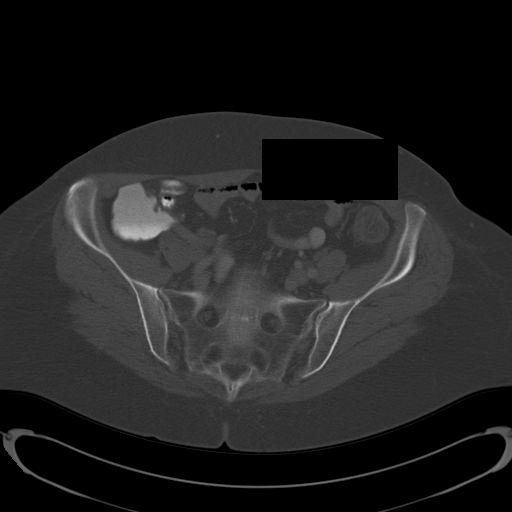
[im 51/135  bone]
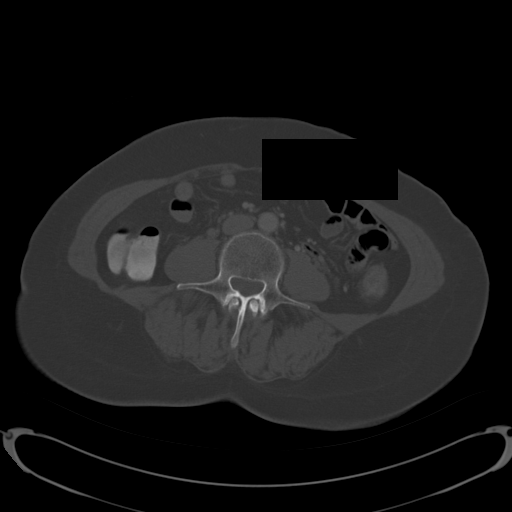
[im 68/135  bone]
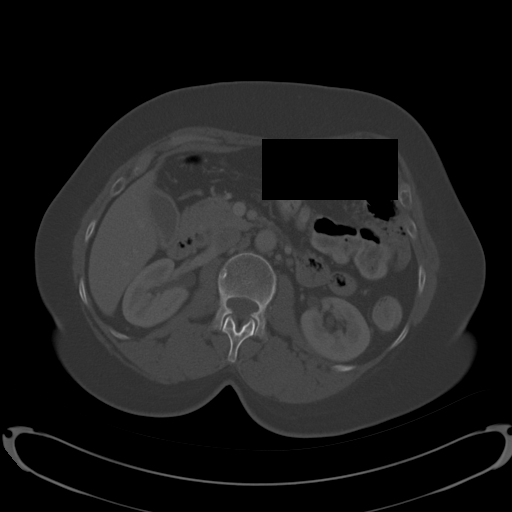
[im 84/135  soft-tissue]
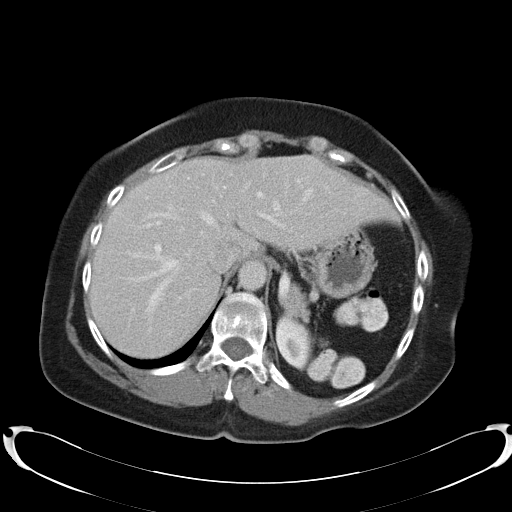
[im 84/135  bone]
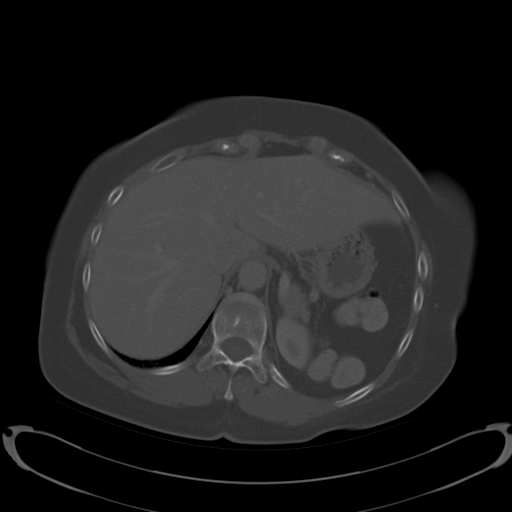
[im 101/135  bone]
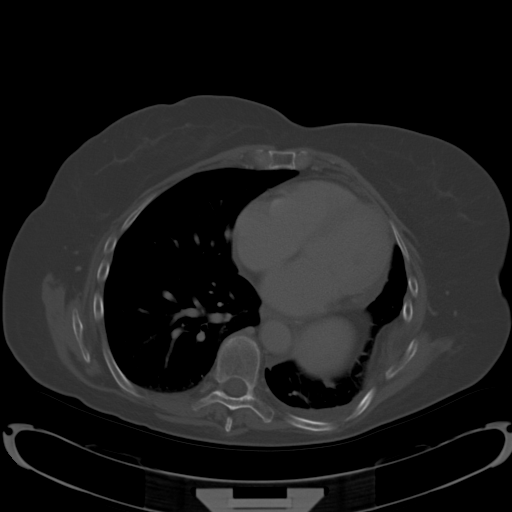
[im 118/135  bone]
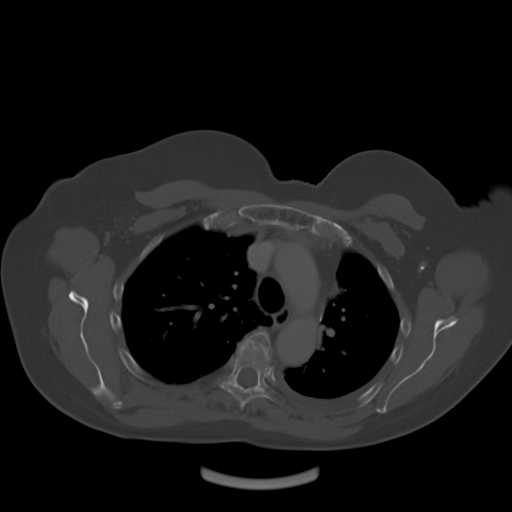

[Series 602: axial reformats · axial · 0.39mm/px · z∈[-350,-177]mm · 7 of 130 slices shown]
[im 17/130  bone]
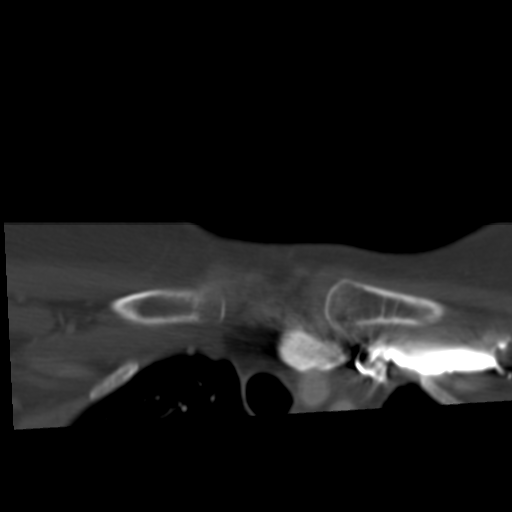
[im 33/130  bone]
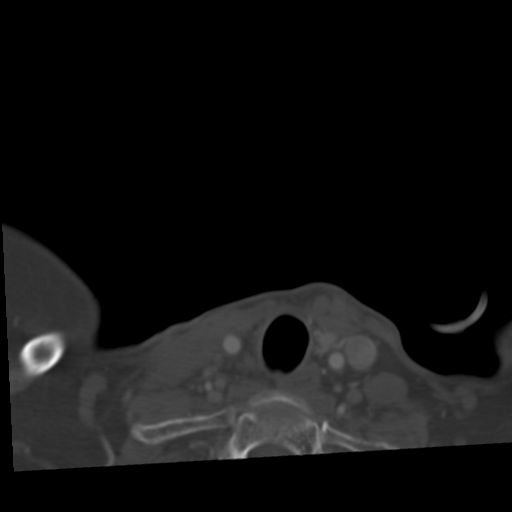
[im 49/130  bone]
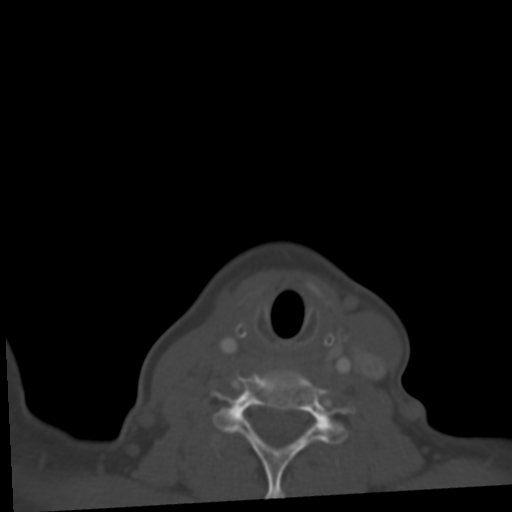
[im 65/130  bone]
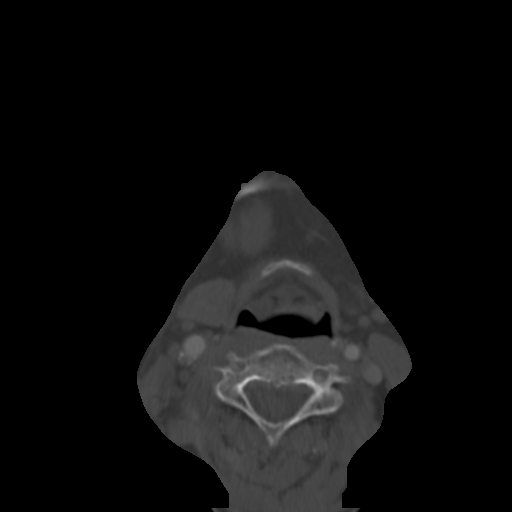
[im 81/130  bone]
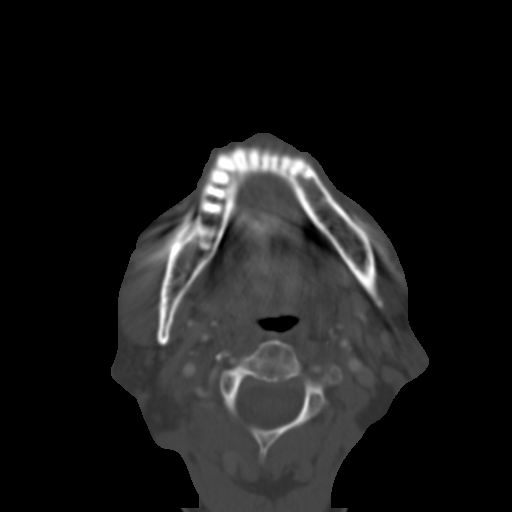
[im 97/130  bone]
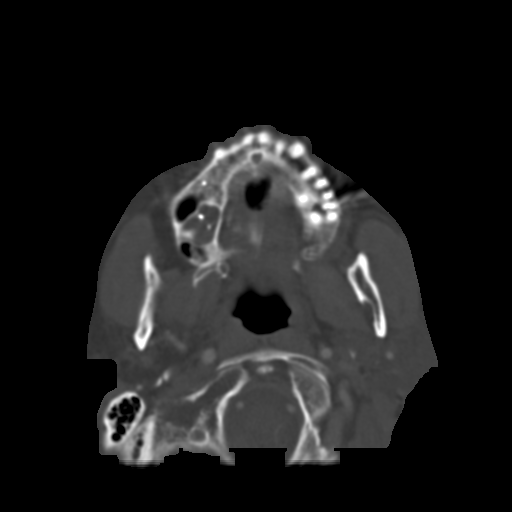
[im 113/130  bone]
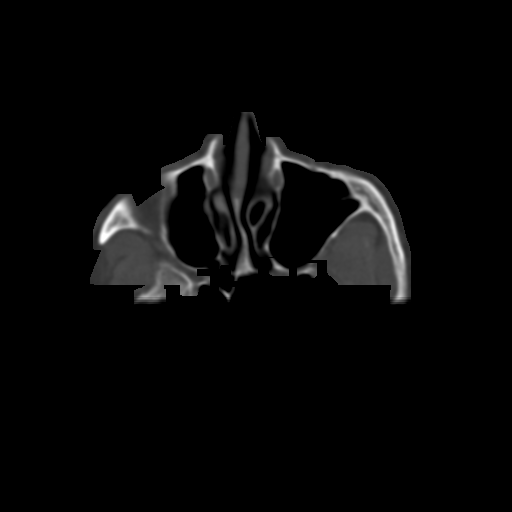

[Series 603: coronal images · coronal · 0.39mm/px · 2 of 84 slices shown]
[im 28/84  bone]
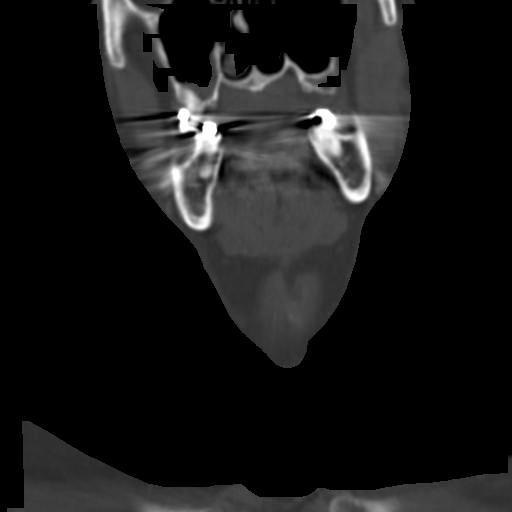
[im 56/84  bone]
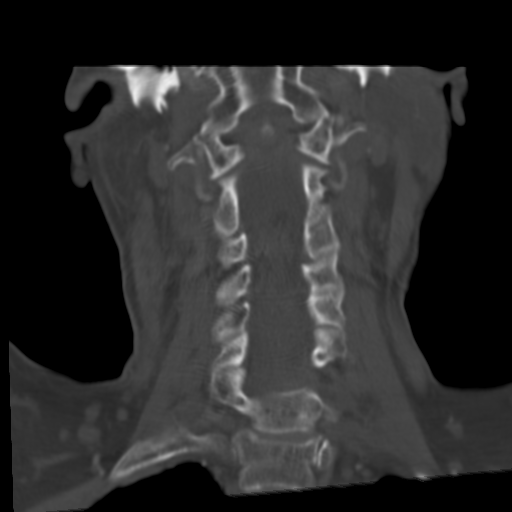

[16 of 46 positions shown; findings below may reference images not displayed]

FINDINGS: No axillary lymphadenopathy.  Inflammation in the right
supraclavicular location at site of interval surgery.  No
supraclavicular adenopathy present.  No mediastinal
lymphadenopathy.  Trace pericardial thickening is present.
Esophagus is normal.

There is volume loss in the left hemithorax following left upper
lobectomy.  There is no evidence of new nodularity.  There is a
partially loculated pleural fluid on the left which is not changed.
5 mm right upper lobe pulmonary nodule (image 20) is unchanged from
CT 05/17/2011.  No new pulmonary nodules.
IMPRESSION: 1..  No evidence of metastatic lymphadenopathy.
2.  Postsurgical change in the right neck and left hemithorax.
3.  Stable right upper lobe pulmonary nodule.

CT ABDOMEN AND PELVIS
FINDINGS: A tiny hypodensity in the left hepatic lobe which is
stable.  No new hepatic lesions.  The gallbladder, pancreas,
spleen, adrenal glands are normal.  Kidneys are normal.

The stomach, small bowel, appendix, and cecum are normal.  Colon
rectosigmoid colon are normal.

Abdominal aorta is normal caliber.  No retroperitoneal or
periportal lymphadenopathy.  No peritoneal disease.

The uterus and bladder normal.  There is a fibroid within the
uterine fundus.  No pelvic lymphadenopathy. Review of  bone windows
demonstrates no aggressive osseous lesions.
IMPRESSION: No evidence of metastatic disease in the abdomen or pelvis.  Stable
exam.

## 2013-12-07 ENCOUNTER — Telehealth: Payer: Self-pay | Admitting: Internal Medicine

## 2013-12-07 ENCOUNTER — Other Ambulatory Visit (HOSPITAL_BASED_OUTPATIENT_CLINIC_OR_DEPARTMENT_OTHER): Payer: Medicare Other

## 2013-12-07 ENCOUNTER — Ambulatory Visit (HOSPITAL_BASED_OUTPATIENT_CLINIC_OR_DEPARTMENT_OTHER): Payer: Medicare Other | Admitting: Internal Medicine

## 2013-12-07 ENCOUNTER — Encounter: Payer: Self-pay | Admitting: Internal Medicine

## 2013-12-07 VITALS — BP 119/67 | HR 87 | Temp 98.5°F | Resp 20 | Ht 69.0 in | Wt 196.1 lb

## 2013-12-07 DIAGNOSIS — C3410 Malignant neoplasm of upper lobe, unspecified bronchus or lung: Secondary | ICD-10-CM | POA: Diagnosis not present

## 2013-12-07 DIAGNOSIS — C7931 Secondary malignant neoplasm of brain: Secondary | ICD-10-CM

## 2013-12-07 DIAGNOSIS — C3490 Malignant neoplasm of unspecified part of unspecified bronchus or lung: Secondary | ICD-10-CM

## 2013-12-07 DIAGNOSIS — C7949 Secondary malignant neoplasm of other parts of nervous system: Secondary | ICD-10-CM

## 2013-12-07 DIAGNOSIS — R5383 Other fatigue: Secondary | ICD-10-CM

## 2013-12-07 DIAGNOSIS — C770 Secondary and unspecified malignant neoplasm of lymph nodes of head, face and neck: Secondary | ICD-10-CM

## 2013-12-07 DIAGNOSIS — R5381 Other malaise: Secondary | ICD-10-CM | POA: Diagnosis not present

## 2013-12-07 DIAGNOSIS — C3430 Malignant neoplasm of lower lobe, unspecified bronchus or lung: Secondary | ICD-10-CM | POA: Diagnosis not present

## 2013-12-07 LAB — CBC WITH DIFFERENTIAL/PLATELET
BASO%: 0.8 % (ref 0.0–2.0)
Basophils Absolute: 0 10*3/uL (ref 0.0–0.1)
EOS%: 5.5 % (ref 0.0–7.0)
Eosinophils Absolute: 0.2 10*3/uL (ref 0.0–0.5)
HCT: 39.3 % (ref 34.8–46.6)
HGB: 12.7 g/dL (ref 11.6–15.9)
LYMPH%: 14 % (ref 14.0–49.7)
MCH: 29.7 pg (ref 25.1–34.0)
MCHC: 32.4 g/dL (ref 31.5–36.0)
MCV: 91.6 fL (ref 79.5–101.0)
MONO#: 0.3 10*3/uL (ref 0.1–0.9)
MONO%: 8.1 % (ref 0.0–14.0)
NEUT#: 2.6 10*3/uL (ref 1.5–6.5)
NEUT%: 71.6 % (ref 38.4–76.8)
Platelets: 265 10*3/uL (ref 145–400)
RBC: 4.29 10*6/uL (ref 3.70–5.45)
RDW: 14.7 % — ABNORMAL HIGH (ref 11.2–14.5)
WBC: 3.7 10*3/uL — ABNORMAL LOW (ref 3.9–10.3)
lymph#: 0.5 10*3/uL — ABNORMAL LOW (ref 0.9–3.3)

## 2013-12-07 LAB — COMPREHENSIVE METABOLIC PANEL (CC13)
ALT: 13 U/L (ref 0–55)
AST: 16 U/L (ref 5–34)
Albumin: 3.8 g/dL (ref 3.5–5.0)
Alkaline Phosphatase: 70 U/L (ref 40–150)
Anion Gap: 9 mEq/L (ref 3–11)
BUN: 12.1 mg/dL (ref 7.0–26.0)
CO2: 27 mEq/L (ref 22–29)
Calcium: 9.7 mg/dL (ref 8.4–10.4)
Chloride: 110 mEq/L — ABNORMAL HIGH (ref 98–109)
Creatinine: 0.9 mg/dL (ref 0.6–1.1)
Glucose: 85 mg/dl (ref 70–140)
Potassium: 3.5 mEq/L (ref 3.5–5.1)
Sodium: 146 mEq/L — ABNORMAL HIGH (ref 136–145)
Total Bilirubin: 0.99 mg/dL (ref 0.20–1.20)
Total Protein: 6.7 g/dL (ref 6.4–8.3)

## 2013-12-07 NOTE — Progress Notes (Signed)
[place] [phone]   [phone]  OFFICE PROGRESS NOTE  [name], MD 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  BIOMARKERS TESTING ([facility]): Positive for: EGFR E746-A750 del, T790M, CTNNB1 D32G, SMAD4 T149fs*10 Negative for: RET, ALK, BRAF, KRAS, ERBB2 and MET  PRIOR THERAPY:  1. Status post left lower lobectomy under the care of Dr Arlyce Dice in October 2002. 2. Status post course of concurrent chemoradiation with weekly carboplatin and paclitaxel under the care of Dr Benay Spice and Dr Elba Barman in early 2003. 3. Status post treatment with Celebrex and Iressa according to the clinical trial at [facility] for a total of 1 year. 4. Status post left occipital craniotomy for tumor resection on September 21, 2005, under the care of Dr Christella Noa. 5. Status post whole brain irradiation under the care of Dr Elba Barman, completed November 07, 2005. 6. Status post gamma knife treatment to the resected cavitary recurrence on June 02, 2008, at [facility]. 7. Status post right thyroidectomy with radical neck dissection under the care of Dr. Constance Holster on 10/18/2011. 8. Status post excisional biopsy of right cervical lymph node that was consistent with metastatic adenocarcinoma.  CURRENT THERAPY:  1) Tarceva 150 mg by mouth daily with therapy beginning 06/03/2007. 2) palliative radiotherapy to the enlarging right upper lobe lung nodule under the care of Dr. Valere Dross.  CHEMOTHERAPY INTENT: palliative  CURRENT # OF CHEMOTHERAPY CYCLES: 78  CURRENT ANTIEMETICS: Compazine when necessary  CURRENT SMOKING STATUS: nonsmoker  ORAL CHEMOTHERAPY AND CONSENT: Tarceva  CURRENT BISPHOSPHONATES USE: None  PAIN MANAGEMENT: None  NARCOTICS INDUCED CONSTIPATION: None  LIVING WILL AND CODE STATUS: Full  code  INTERVAL HISTORY: Valerie [name] 68 y.o. female returns to the clinic today for three-month follow up visit. The patient is feeling fine today with no specific complaints except for increasing fatigue. She denied having any significant chest pain, shortness of breath, cough or hemoptysis. The patient denied having any nausea or vomiting. She had no weight loss or night sweats. She is tolerating her treatment with Tarceva fairly well with no significant skin rash but occasional episodes of diarrhea. The patient is here today for evaluation and discussion of her treatment options.    MEDICAL HISTORY: Past Medical History  Diagnosis Date  . Heart murmur   . Shortness of breath     hx lung ca   . Arthritis   . Lung cancer dx'd 2002  . Metastasis to brain dx'd 2008  . 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 Anal fistula   . H/O osteoporosis   . H/O vitamin D deficiency   . History of measles, mumps, or rubella   . H/O varicella   . Hypertension   . Yeast infection   . Abnormal Pap smear 2006  . Atrophic vaginitis 2008  . Dyspareunia 2008  . Dementia 2009  . Osteoporosis 2010  . Status post chemotherapy 2003    CARBOPLATIN/PACLITAXEL /STATUS POST CLINICAL TRAIL OF CELEBREX AND IRESSA AT [facility] FOR 1 YEAR  . Status post radiation therapy 2003    LEFT LUNG  . Status post radiation therapy 11/07/2005    WHOLE BRAIN: DR Larkin [facility]  . Status post radiation therapy 06/02/2008    GAMMA KNIFE OF RESECTED CAVITAY  . On antineoplastic chemotherapy     TARCEVA  . Nodule of right  lung CT- 06/03/12    RIGHT UPPER LOBE  . Thyroid adenoma     ?  [name] Thyroid cancer 10/18/11 bx    adenoid nodules   . Anxiety   . Cataract   . Nodule of right lung 06/03/12    Upper Lobe  . Lung metastasis     PET scan 05/05/13, RUL lung nodule  . History of radiation therapy 07/28/13- 08/10/13    right lung metastasis 5000 cGy 10 sessions    ALLERGIES:  has No Known Allergies.  MEDICATIONS:   Current Outpatient Prescriptions  Medication Sig Dispense Refill  . alendronate (FOSAMAX) 70 MG tablet Take 1 tablet (70 mg total) by mouth every 7 (seven) days. Saturdays; Take with a full glass of water on an empty stomach.  4 tablet  12  . Ascorbic Acid (VITAMIN C PO) Take 1 tablet by mouth daily. Takes twice a wek      . aspirin EC 81 MG tablet Take 81 mg by mouth every other day.       . Cholecalciferol (VITAMIN D3) 2000 UNITS TABS Take 2,000 Int'l Units by mouth every other day.       . donepezil (ARICEPT) 10 MG tablet Take 1 tablet (10 mg total) by mouth daily.  90 tablet  0  . erlotinib (TARCEVA) 150 MG tablet Take 1 tablet (150 mg total) by mouth daily. Take on an empty stomach 1 hour before meals or 2 hours after.  30 tablet  1  . loperamide (IMODIUM) 2 MG capsule Take 2 mg by mouth 4 (four) times daily as needed for diarrhea or loose stools.      . Multiple Vitamin (MULITIVITAMIN WITH MINERALS) TABS Take 1 tablet by mouth daily.      . naphazoline-pheniramine (NAPHCON-A) 0.025-0.3 % ophthalmic solution Place 1 drop into both eyes 4 (four) times daily as needed. For dry eyes      . [DISCONTINUED] erlotinib (TARCEVA) 150 MG tablet Take 1 tablet (150 mg total) by mouth daily. Take on an empty stomach 1 hour before meals or 2 hours after.  30 tablet  2   No current facility-administered medications for this visit.    SURGICAL HISTORY:  Past Surgical History  Procedure Laterality Date  . Left occipital craniotomy  09/21/2005    tumor  . Left lower lobectomy  05/2001  . Radical neck dissection  10/18/2011    Procedure: RADICAL NECK DISSECTION;  Surgeon: Izora Gala, MD;  Location: [place];  Service: ENT;  Laterality: Right;  RIGHT MODIFIED NECK DISSECTION /POSSIBLE RIGHT THYROIDECTOM  . Thyroidectomy  10/18/2011    Procedure: THYROIDECTOMY WITH RADICAL NECK DISSECTION;  Surgeon: Izora Gala, MD;  Location: [place];  Service: ENT;  Laterality: Right;    REVIEW OF SYSTEMS:  Constitutional:  negative Eyes: negative Ears, nose, mouth, throat, and face: negative Respiratory: negative Cardiovascular: negative Gastrointestinal: negative Genitourinary:negative Integument/breast: negative Hematologic/lymphatic: negative Musculoskeletal:negative Neurological: negative Behavioral/Psych: negative Endocrine: negative Allergic/Immunologic: negative   PHYSICAL EXAMINATION: General appearance: alert, cooperative, fatigued and no distress Head: Normocephalic, without obvious abnormality, atraumatic Neck: no adenopathy Lymph nodes: Cervical, supraclavicular, and axillary nodes normal. Resp: clear to auscultation bilaterally Cardio: regular rate and rhythm, S1, S2 normal, no murmur, click, rub or gallop GI: soft, non-tender; bowel sounds normal; no masses,  no organomegaly Extremities: extremities normal, atraumatic, no cyanosis or edema Neurologic: Alert and oriented X 3, normal strength and tone. Normal symmetric reflexes. Normal coordination and gait  ECOG PERFORMANCE STATUS: 1 - Symptomatic  but completely ambulatory  There were no vitals taken for this visit.  LABORATORY DATA: Lab Results  Component Value Date   WBC 4.6 10/19/2013   HGB 12.6 10/19/2013   HCT 38.5 10/19/2013   MCV 91.7 10/19/2013   PLT 234 10/19/2013      Chemistry      Component Value Date/Time   NA 139 10/19/2013 0759   NA 138 02/28/2012 0939   NA 140 01/23/2012 0941   K 3.8 10/19/2013 0759   K 4.6 02/28/2012 0939   K 3.6 01/23/2012 0941   CL 106 12/09/2012 0806   CL 99 02/28/2012 0939   CL 105 01/23/2012 0941   CO2 27 10/19/2013 0759   CO2 28 02/28/2012 0939   CO2 26 01/23/2012 0941   BUN 11.8 10/19/2013 0759   BUN 12 02/28/2012 0939   BUN 11 01/23/2012 0941   CREATININE 0.8 10/19/2013 0759   CREATININE 1.0 02/28/2012 0939   CREATININE 0.83 01/23/2012 0941      Component Value Date/Time   CALCIUM 9.4 10/19/2013 0759   CALCIUM 9.1 02/28/2012 0939   CALCIUM 9.5 01/23/2012 0941   ALKPHOS 66 10/19/2013 0759   ALKPHOS 80 02/28/2012  0939   ALKPHOS 72 01/23/2012 0941   AST 17 10/19/2013 0759   AST 20 02/28/2012 0939   AST 17 01/23/2012 0941   ALT 16 10/19/2013 0759   ALT 21 02/28/2012 0939   ALT 17 01/23/2012 0941   BILITOT 1.05 10/19/2013 0759   BILITOT 1.30 02/28/2012 0939   BILITOT 1.3* 01/23/2012 0941       RADIOGRAPHIC STUDIES:  ASSESSMENT AND PLAN: this is a very pleasant 68 years old African American female with metastatic non-small cell lung cancer adenocarcinoma, with positive EGFR mutation but also resistant mutation T790M. She has been on treatment with oral Tarceva for more than 6 years with no evidence for disease progression.  I recommended for the patient to continue her current treatment with Tarceva 150 mg by mouth daily. It looks like the palliative radiotherapy did a positive effect on the progressive nodules with the EGFR resistant mutation. I will see the patient back for followup visit in 6 weeks with repeat CBC and comprehensive metabolic panel, as well as CT scan of the neck, chest, abdomen and pelvis. Definitely if she has any other evidence for disease progression on the upcoming scan, I may refer the patient for a clinical trial verified with the intent his inhibitor that targets the resistant mutation T790M, likely [id number] or [id number].  She was advised to call immediately if she has any concerning symptoms in the interval.  The patient voices understanding of current disease status and treatment options and is in agreement with the current care plan.  All questions were answered. The patient knows to call the clinic with any problems, questions or concerns. We can certainly see the patient much sooner if necessary.  Disclaimer: This note was dictated with voice recognition software. Similar sounding words can inadvertently be transcribed and may not be corrected upon review.

## 2013-12-07 NOTE — Telephone Encounter (Signed)
gv adn printed appt sched and avs for ptf ro June....pt wants water based

## 2013-12-07 NOTE — Telephone Encounter (Signed)
cs did not have ct available on 6.2.14 changed to 6.3.15...mailed pt new appt sched/avs and letter

## 2013-12-09 ENCOUNTER — Telehealth: Payer: Self-pay | Admitting: Internal Medicine

## 2013-12-09 NOTE — Telephone Encounter (Signed)
Faxed pt medical records to [facility]

## 2014-01-08 ENCOUNTER — Other Ambulatory Visit: Payer: Self-pay | Admitting: *Deleted

## 2014-01-08 NOTE — Telephone Encounter (Signed)
THIS REFILL REQUEST FOR TARCEVA WAS PLACED ON DR.MOHAMED'S DESK.

## 2014-01-12 ENCOUNTER — Telehealth: Payer: Self-pay | Admitting: Medical Oncology

## 2014-01-12 DIAGNOSIS — C3490 Malignant neoplasm of unspecified part of unspecified bronchus or lung: Secondary | ICD-10-CM

## 2014-01-12 MED ORDER — ERLOTINIB HCL 150 MG PO TABS: 150.0000 mg | ORAL_TABLET | Freq: Every day | ORAL | Status: DC

## 2014-01-12 NOTE — Telephone Encounter (Signed)
tarceva refilled

## 2014-01-12 NOTE — Telephone Encounter (Signed)
I returned pts call about tarceva. I left message that Tarceva refill faxed today.

## 2014-01-19 ENCOUNTER — Other Ambulatory Visit: Payer: Medicare Other

## 2014-01-20 ENCOUNTER — Ambulatory Visit (HOSPITAL_COMMUNITY)
Admission: RE | Admit: 2014-01-20 | Discharge: 2014-01-20 | Disposition: A | Discharge status: Home / Self Care | Payer: Medicare Other | Source: Ambulatory Visit | Attending: Internal Medicine | Admitting: Internal Medicine

## 2014-01-20 ENCOUNTER — Encounter (HOSPITAL_COMMUNITY): Payer: Self-pay

## 2014-01-20 ENCOUNTER — Other Ambulatory Visit (HOSPITAL_BASED_OUTPATIENT_CLINIC_OR_DEPARTMENT_OTHER): Payer: Medicare Other

## 2014-01-20 DIAGNOSIS — C3430 Malignant neoplasm of lower lobe, unspecified bronchus or lung: Secondary | ICD-10-CM

## 2014-01-20 DIAGNOSIS — C779 Secondary and unspecified malignant neoplasm of lymph node, unspecified: Secondary | ICD-10-CM | POA: Diagnosis not present

## 2014-01-20 DIAGNOSIS — C7931 Secondary malignant neoplasm of brain: Secondary | ICD-10-CM

## 2014-01-20 DIAGNOSIS — E0789 Other specified disorders of thyroid: Secondary | ICD-10-CM | POA: Insufficient documentation

## 2014-01-20 DIAGNOSIS — J90 Pleural effusion, not elsewhere classified: Secondary | ICD-10-CM | POA: Insufficient documentation

## 2014-01-20 DIAGNOSIS — Z902 Acquired absence of lung [part of]: Secondary | ICD-10-CM | POA: Insufficient documentation

## 2014-01-20 DIAGNOSIS — C7949 Secondary malignant neoplasm of other parts of nervous system: Secondary | ICD-10-CM | POA: Insufficient documentation

## 2014-01-20 DIAGNOSIS — R599 Enlarged lymph nodes, unspecified: Secondary | ICD-10-CM | POA: Diagnosis not present

## 2014-01-20 DIAGNOSIS — C3490 Malignant neoplasm of unspecified part of unspecified bronchus or lung: Secondary | ICD-10-CM | POA: Insufficient documentation

## 2014-01-20 DIAGNOSIS — C770 Secondary and unspecified malignant neoplasm of lymph nodes of head, face and neck: Secondary | ICD-10-CM

## 2014-01-20 DIAGNOSIS — R911 Solitary pulmonary nodule: Secondary | ICD-10-CM | POA: Insufficient documentation

## 2014-01-20 LAB — CBC WITH DIFFERENTIAL/PLATELET
BASO%: 0.8 % (ref 0.0–2.0)
Basophils Absolute: 0 10*3/uL (ref 0.0–0.1)
EOS%: 6.7 % (ref 0.0–7.0)
Eosinophils Absolute: 0.3 10*3/uL (ref 0.0–0.5)
HCT: 38.9 % (ref 34.8–46.6)
HGB: 12.7 g/dL (ref 11.6–15.9)
LYMPH%: 16 % (ref 14.0–49.7)
MCH: 29.8 pg (ref 25.1–34.0)
MCHC: 32.5 g/dL (ref 31.5–36.0)
MCV: 91.5 fL (ref 79.5–101.0)
MONO#: 0.4 10*3/uL (ref 0.1–0.9)
MONO%: 9.4 % (ref 0.0–14.0)
NEUT#: 3.1 10*3/uL (ref 1.5–6.5)
NEUT%: 67.1 % (ref 38.4–76.8)
Platelets: 254 10*3/uL (ref 145–400)
RBC: 4.25 10*6/uL (ref 3.70–5.45)
RDW: 14.3 % (ref 11.2–14.5)
WBC: 4.6 10*3/uL (ref 3.9–10.3)
lymph#: 0.7 10*3/uL — ABNORMAL LOW (ref 0.9–3.3)

## 2014-01-20 LAB — COMPREHENSIVE METABOLIC PANEL (CC13)
ALT: 16 U/L (ref 0–55)
AST: 14 U/L (ref 5–34)
Albumin: 3.8 g/dL (ref 3.5–5.0)
Alkaline Phosphatase: 68 U/L (ref 40–150)
Anion Gap: 14 mEq/L — ABNORMAL HIGH (ref 3–11)
BUN: 14.8 mg/dL (ref 7.0–26.0)
CO2: 23 mEq/L (ref 22–29)
Calcium: 9.4 mg/dL (ref 8.4–10.4)
Chloride: 104 mEq/L (ref 98–109)
Creatinine: 0.8 mg/dL (ref 0.6–1.1)
Glucose: 97 mg/dl (ref 70–140)
Potassium: 3.7 mEq/L (ref 3.5–5.1)
Sodium: 141 mEq/L (ref 136–145)
Total Bilirubin: 0.94 mg/dL (ref 0.20–1.20)
Total Protein: 6.7 g/dL (ref 6.4–8.3)

## 2014-01-20 MED ORDER — IOHEXOL 300 MG/ML  SOLN
50.0000 mL | Freq: Once | INTRAMUSCULAR | Status: AC | PRN
  Administered 2014-01-20: 50 mL via ORAL

## 2014-01-20 MED ORDER — IOHEXOL 300 MG/ML  SOLN
125.0000 mL | Freq: Once | INTRAMUSCULAR | Status: AC | PRN
  Administered 2014-01-20: 125 mL via INTRAVENOUS

## 2014-01-26 ENCOUNTER — Telehealth: Payer: Self-pay | Admitting: Internal Medicine

## 2014-01-26 ENCOUNTER — Ambulatory Visit (HOSPITAL_BASED_OUTPATIENT_CLINIC_OR_DEPARTMENT_OTHER): Payer: Medicare Other | Admitting: Internal Medicine

## 2014-01-26 ENCOUNTER — Encounter: Payer: Self-pay | Admitting: Internal Medicine

## 2014-01-26 VITALS — BP 102/81 | HR 105 | Temp 97.4°F | Resp 18 | Ht 69.0 in | Wt 195.4 lb

## 2014-01-26 DIAGNOSIS — C3430 Malignant neoplasm of lower lobe, unspecified bronchus or lung: Secondary | ICD-10-CM | POA: Diagnosis not present

## 2014-01-26 DIAGNOSIS — R5383 Other fatigue: Secondary | ICD-10-CM

## 2014-01-26 DIAGNOSIS — C7931 Secondary malignant neoplasm of brain: Secondary | ICD-10-CM | POA: Diagnosis not present

## 2014-01-26 DIAGNOSIS — R5381 Other malaise: Secondary | ICD-10-CM | POA: Diagnosis not present

## 2014-01-26 DIAGNOSIS — C3490 Malignant neoplasm of unspecified part of unspecified bronchus or lung: Secondary | ICD-10-CM

## 2014-01-26 DIAGNOSIS — R599 Enlarged lymph nodes, unspecified: Secondary | ICD-10-CM

## 2014-01-26 DIAGNOSIS — C7949 Secondary malignant neoplasm of other parts of nervous system: Secondary | ICD-10-CM

## 2014-01-26 NOTE — Progress Notes (Signed)
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  BIOMARKERS TESTING ([facility]): Positive for: EGFR E746-A750 del, T790M, CTNNB1 D32G, SMAD4 T14f*10 Negative for: RET, ALK, BRAF, KRAS, ERBB2 and MET  PRIOR THERAPY:  1. Status post left lower lobectomy under the care of Dr BArlyce Dicein October 2002. 2. Status post course of concurrent chemoradiation with weekly carboplatin and paclitaxel under the care of Dr SBenay Spiceand Dr WElba Barmanin early 2003. 3. Status post [facility] 4. Status post left occipital craniotomy for tumor resection on September 21, 2005, under the care of Dr CChristella Noa 5. Status post whole brain irradiation under the care of Dr WElba Barman completed November 07, 2005. 6. Status post gamma knife [facility]. Status post right thyroidectomy with radical neck dissection under the care of Dr. RConstance Holsteron 10/18/2011. 8. Status post excisional biopsy of right cervical lymph node that was consistent with metastatic adenocarcinoma.  CURRENT THERAPY:  1) Tarceva 150 mg by mouth daily with therapy beginning 06/03/2007. 2) palliative radiotherapy to the enlarging right upper lobe lung nodule under the care of Dr. MValere Dross  CHEMOTHERAPY INTENT: palliative  CURRENT # OF CHEMOTHERAPY CYCLES: 78  CURRENT ANTIEMETICS: Compazine when necessary  CURRENT SMOKING STATUS: nonsmoker  ORAL CHEMOTHERAPY AND CONSENT: Tarceva  CURRENT BISPHOSPHONATES USE: None  PAIN MANAGEMENT: None  NARCOTICS INDUCED CONSTIPATION: None  LIVING WILL AND CODE STATUS: Full  code  INTERVAL HISTORY: Valerie RENDEROS68y.o. female returns to the clinic today for three-month follow up visit. The patient is feeling fine today with no specific complaints except for mild fatigue. She denied having any significant chest pain, shortness of breath, cough or hemoptysis. The patient denied having any nausea or vomiting. She had no weight loss or night sweats. She is tolerating her treatment with Tarceva fairly well with no significant skin rash but occasional episodes of diarrhea.  She has repeat CT scan of the chest, abdomen and pelvis performed recently and she is here for evaluation and discussion of her scan results.  MEDICAL HISTORY: Past Medical History  Diagnosis Date  . Heart murmur   . Shortness of breath     hx lung ca   . Arthritis   . Lung cancer dx'd 2002  . Metastasis to brain dx'd 2008  . 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 Anal fistula   . H/O osteoporosis   . H/O vitamin D deficiency   . History of measles, mumps, or rubella   . H/O varicella   . Hypertension   . Yeast infection   . Abnormal Pap smear 2006  . Atrophic vaginitis 2008  . Dyspareunia 2008  . Dementia 2009  . Osteoporosis 2010  . Status post chemotherapy 2003    CARBOPLATIN/PACLITAXEL /STATUS POST CLINICAL TRAIL OF CELEBREX AND IRESSA AT [facility] FOR 1 YEAR  . Status post radiation therapy 2003    LEFT LUNG  . Status post radiation therapy 11/07/2005    WHOLE BRAIN: DR JLarkin [facility]  . Status post radiation therapy 06/02/2008    GAMMA KNIFE OF RESECTED CAVITAY  . On antineoplastic  chemotherapy     TARCEVA  . Nodule of right lung CT- 06/03/12    RIGHT UPPER LOBE  . Thyroid adenoma     ?  [name] Thyroid cancer 10/18/11 bx    adenoid nodules   . Anxiety   . Cataract   . Nodule of right lung 06/03/12    Upper Lobe  . Lung metastasis     PET scan 05/05/13, RUL lung nodule  . History of radiation therapy 07/28/13- 08/10/13    right lung metastasis 5000 cGy 10 sessions     ALLERGIES:  has No Known Allergies.  MEDICATIONS:  Current Outpatient Prescriptions  Medication Sig Dispense Refill  . alendronate (FOSAMAX) 70 MG tablet Take 1 tablet (70 mg total) by mouth every 7 (seven) days. Saturdays; Take with a full glass of water on an empty stomach.  4 tablet  12  . Ascorbic Acid (VITAMIN C PO) Take 1 tablet by mouth daily. Takes twice a wek      . aspirin EC 81 MG tablet Take 81 mg by mouth every other day.       . Cholecalciferol (VITAMIN D3) 2000 UNITS TABS Take 2,000 Int'l Units by mouth every other day.       . donepezil (ARICEPT) 10 MG tablet Take 1 tablet (10 mg total) by mouth daily.  90 tablet  0  . erlotinib (TARCEVA) 150 MG tablet Take 1 tablet (150 mg total) by mouth daily. Take on an empty stomach 1 hour before meals or 2 hours after.  30 tablet  1  . loperamide (IMODIUM) 2 MG capsule Take 2 mg by mouth 4 (four) times daily as needed for diarrhea or loose stools.      . Multiple Vitamin (MULITIVITAMIN WITH MINERALS) TABS Take 1 tablet by mouth daily.      . naphazoline-pheniramine (NAPHCON-A) 0.025-0.3 % ophthalmic solution Place 1 drop into both eyes 4 (four) times daily as needed. For dry eyes      . [DISCONTINUED] erlotinib (TARCEVA) 150 MG tablet Take 1 tablet (150 mg total) by mouth daily. Take on an empty stomach 1 hour before meals or 2 hours after.  30 tablet  2   No current facility-administered medications for this visit.    SURGICAL HISTORY:  Past Surgical History  Procedure Laterality Date  . Left occipital craniotomy  09/21/2005    tumor  . Left lower lobectomy  05/2001  . Radical neck dissection  10/18/2011    Procedure: RADICAL NECK DISSECTION;  Surgeon: Izora Gala, MD;  Location: [place];  Service: ENT;  Laterality: Right;  RIGHT MODIFIED NECK DISSECTION /POSSIBLE RIGHT THYROIDECTOM  . Thyroidectomy  10/18/2011    Procedure: THYROIDECTOMY WITH RADICAL NECK DISSECTION;  Surgeon: Izora Gala, MD;  Location: [place];  Service: ENT;   Laterality: Right;    REVIEW OF SYSTEMS:  Constitutional: positive for fatigue Eyes: negative Ears, nose, mouth, throat, and face: negative Respiratory: negative Cardiovascular: negative Gastrointestinal: positive for diarrhea Genitourinary:negative Integument/breast: negative Hematologic/lymphatic: negative Musculoskeletal:negative Neurological: negative Behavioral/Psych: negative Endocrine: negative Allergic/Immunologic: negative   PHYSICAL EXAMINATION: General appearance: alert, cooperative, fatigued and no distress Head: Normocephalic, without obvious abnormality, atraumatic Neck: no adenopathy Lymph nodes: Cervical, supraclavicular, and axillary nodes normal. Resp: clear to auscultation bilaterally Cardio: regular rate and rhythm, S1, S2 normal, no murmur, click, rub or gallop GI: soft, non-tender; bowel sounds normal; no masses,  no organomegaly Extremities: extremities normal, atraumatic, no cyanosis or edema Neurologic: Alert and oriented X 3, normal strength and  tone. Normal symmetric reflexes. Normal coordination and gait  ECOG PERFORMANCE STATUS: 1 - Symptomatic but completely ambulatory  Blood pressure 102/81, pulse 105, temperature 97.4 F (36.3 C), temperature source Oral, resp. rate 18, height _0  (1.753 m), weight 195 lb 6.4 oz (88.633 kg), SpO2 99.00%.  LABORATORY DATA: Lab Results  Component Value Date   WBC 4.6 01/20/2014   HGB 12.7 01/20/2014   HCT 38.9 01/20/2014   MCV 91.5 01/20/2014   PLT 254 01/20/2014      Chemistry      Component Value Date/Time   NA 141 01/20/2014 0941   NA 138 02/28/2012 0939   NA 140 01/23/2012 0941   K 3.7 01/20/2014 0941   K 4.6 02/28/2012 0939   K 3.6 01/23/2012 0941   CL 106 12/09/2012 0806   CL 99 02/28/2012 0939   CL 105 01/23/2012 0941   CO2 23 01/20/2014 0941   CO2 28 02/28/2012 0939   CO2 26 01/23/2012 0941   BUN 14.8 01/20/2014 0941   BUN 12 02/28/2012 0939   BUN 11 01/23/2012 0941   CREATININE 0.8 01/20/2014 0941   CREATININE 1.0  02/28/2012 0939   CREATININE 0.83 01/23/2012 0941      Component Value Date/Time   CALCIUM 9.4 01/20/2014 0941   CALCIUM 9.1 02/28/2012 0939   CALCIUM 9.5 01/23/2012 0941   ALKPHOS 68 01/20/2014 0941   ALKPHOS 80 02/28/2012 0939   ALKPHOS 72 01/23/2012 0941   AST 14 01/20/2014 0941   AST 20 02/28/2012 0939   AST 17 01/23/2012 0941   ALT 16 01/20/2014 0941   ALT 21 02/28/2012 0939   ALT 17 01/23/2012 0941   BILITOT 0.94 01/20/2014 0941   BILITOT 1.30 02/28/2012 0939   BILITOT 1.3* 01/23/2012 0941       RADIOGRAPHIC STUDIES: Ct Soft Tissue Neck W Contrast  01/20/2014   CLINICAL DATA:  Non-small-cell lung cancer metastatic to the brain and lymph nodes.  EXAM: CT NECK WITH CONTRAST  TECHNIQUE: Multidetector CT imaging of the neck was performed using the standard protocol following the bolus administration of intravenous contrast.  CONTRAST:  176m OMNIPAQUE IOHEXOL 300 MG/ML  SOLN  COMPARISON:  10/19/2013  FINDINGS: Visualized intracranial contents are unremarkable except for a 4 mm focus of hyperdensity in the left temporal lobe underlying a previous left occipital craniotomy which is a chronic finding.  There is chronic fatty change of the right parotid. No evidence of parotid mass. Submandibular glands are normal. There has been previous right thyroidectomy. The left thyroid lobe appears unremarkable. There are no pathologically enlarged or changing lymph nodes in the neck. No mucosal or submucosal lesion. Both carotid arteries are patent. The right jugular vein is absent. Left jugular vein is patent. No osseous lesion.  IMPRESSION: No evidence of new or recurrent metastatic disease in the neck.   Electronically Signed   By: MNelson ChimesM.D.   On: 01/20/2014 11:43   Ct Chest W Contrast  01/20/2014   CLINICAL DATA:  Metastatic non-small-cell lung cancer with brain metastasis. Stage IIB adenocarcinoma initially diagnosed in 2002. Status post left lower lobectomy. Right-sided thyroidectomy in 2013.  EXAM: CT CHEST,  ABDOMEN, AND PELVIS WITH CONTRAST  TECHNIQUE: Multidetector CT imaging of the chest, abdomen and pelvis was performed following the standard protocol during bolus administration of intravenous contrast.  CONTRAST:  1220mOMNIPAQUE IOHEXOL 300 MG/ML  SOLN  COMPARISON:  10/19/2013.  FINDINGS:   CT CHEST FINDINGS  Lungs/Pleura: Surgical changes of left lower  lobectomy. Airspace and ground-glass opacities surrounds the previously described 7 mm right upper lobe lung nodule. No well-defined residual nodule identified.  3 mm more lateral right upper lobe lung nodule on image 19 which is unchanged.  2 mm right lower lobe lung nodule which is similar on image 34. Similar 3 mm right lower lobe lung nodule on image 27 and subpleural right lower lobe lung nodule also on image 27.  4 mm left apical lung nodule on image 12 is not significantly changed. More lateral 3 mm left upper lobe nodule on image 16 is similar.  A 4 mm right upper lobe nodule on image 21 is not significantly changed.  Left lower lobe 7 mm nodule on image 35, unchanged.  More central subpleural left lower lobe nodule measures 9 mm on image 32 and is similar (when remeasured).  Small left-sided pleural effusion with mild loculation medially. This is unchanged.  Heart/Mediastinum: Right-sided thyroidectomy. 8 mm left subpectoral node on image 17 which is enlarged from 6 mm on the prior.  7 mm left axillary node is new since the prior but not pathologic by size criteria.  Tortuous descending thoracic aorta. Normal heart size without pericardial effusion. No central pulmonary embolism, on this non-dedicated study. No mediastinal or hilar adenopathy. Fluid level in the thoracic esophagus on image 25.    CT ABDOMEN AND PELVIS FINDINGS  Abdomen/Pelvis: Similar too small to characterize lateral segment left liver lobe lesion. Normal spleen, stomach, pancreas, gallbladder, biliary tract, right adrenal gland. Mild left adrenal thickening is chronic. 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 Normal right kidney.  No retroperitoneal or retrocrural adenopathy. Normal colon, appendix, and terminal ileum. Normal small bowel without abdominal ascites. No pelvic adenopathy. Normal urinary bladder, without adnexal mass or significant free pelvic fluid in. Uterine fundal soft tissue fullness, suggesting an underlying 2.7 cm fibroid.  Moderate left hemidiaphragm elevation.  Bones/Musculoskeletal: Postsurgical left-sided rib defects.    IMPRESSION: CT CHEST IMPRESSION  1. New airspace and ground-glass opacity at the site of previously described right upper lobe pulmonary nodule. This is likely related to interval radiation therapy. If no such history, superimposed infection would be possible. 2. Other  bilateral pulmonary nodules which are similar. 3. Enlargement of left subpectoral and axillary lymph nodes. Suspicious for nodal metastasis. These could be re-evaluated at followup or further characterized with PET. 4. Similar small left pleural effusion with minimal loculation medially. 5. Esophageal air fluid level suggests dysmotility or gastroesophageal reflux.  CT ABDOMEN AND PELVIS IMPRESSION  1. No acute process or evidence of metastatic disease in the abdomen or pelvis. 2. Probable uterine fibroid.   Electronically Signed   By: Abigail Miyamoto M.D.   On: 01/20/2014 12:15   ASSESSMENT AND PLAN: this is a very pleasant 68 years old [place] American female with metastatic non-small cell lung cancer adenocarcinoma, with positive EGFR mutation but also resistant mutation T790M. She has been on treatment with oral Tarceva for more than 6 years.  Her recent scan showed enlargement of left subpectoral and axillary lymph node suspicious for nodal metastases. 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 I discussed the scan results with the patient today.  I recommended for the patient to continue her current treatment with Tarceva 150 mg by mouth daily for  now but I will look at the tertiary center in the area to see if there is any clinical trial for EGFR resistant mutation for referring her for this studies. I will see the  patient back for followup visit in 4 weeks with repeat CBC and comprehensive metabolic panel.  She was advised to call immediately if she has any concerning symptoms in the interval.  The patient voices understanding of current disease status and treatment options and is in agreement with the current care plan.  All questions were answered. The patient knows to call the clinic with any problems, questions or concerns. We can certainly see the patient much sooner if necessary.  Disclaimer: This note was dictated with voice recognition software. Similar sounding words can inadvertently be transcribed and may not be corrected upon review.

## 2014-01-26 NOTE — Telephone Encounter (Signed)
gv and printed appt sched and avs fo rpt for July °

## 2014-02-08 ENCOUNTER — Telehealth: Payer: Self-pay | Admitting: *Deleted

## 2014-02-08 NOTE — Telephone Encounter (Signed)
Pt called left a message stating she has been having some pain in her left chest that is new.  She wants to talk to Dr Vista Mink about what the next steps are in her treatment with a trial.  Per Dr Vista Mink, he is going to make a referral to Dr Stormy Card and she can come in to see AJ this week to assess her pain.  Called pt back and left her a message with the information.

## 2014-02-09 ENCOUNTER — Other Ambulatory Visit: Payer: Self-pay | Admitting: Internal Medicine

## 2014-02-09 DIAGNOSIS — C3490 Malignant neoplasm of unspecified part of unspecified bronchus or lung: Secondary | ICD-10-CM

## 2014-02-09 NOTE — Telephone Encounter (Signed)
Spoke with patient today, she states that her pain is better today and when she takes ibuprofen her pain completely resolves.  She thinks she may have pulled a muscle.  Per Dr Vista Mink, referral made to Dr Aniceto Boss at [facility] for the genetic mutation trial.  He also does not feel that the pain in her left side is r/t her disease but she is to call back if her pain gets worse or changes.  Pt verbalized understanding.  SLJ

## 2014-02-11 ENCOUNTER — Telehealth: Payer: Self-pay | Admitting: Internal Medicine

## 2014-02-11 NOTE — Telephone Encounter (Signed)
, °

## 2014-02-15 ENCOUNTER — Telehealth: Payer: Self-pay | Admitting: Internal Medicine

## 2014-02-15 NOTE — Telephone Encounter (Signed)
Pt appt. To see Dr. Aniceto Boss is 02/25/14@1 :58. Faxed medical records. Jocelyn Lamer from Dr. Vertell Limber office will request slides and scans to be fedex'ed. Pt is aware

## 2014-02-18 DIAGNOSIS — C3490 Malignant neoplasm of unspecified part of unspecified bronchus or lung: Secondary | ICD-10-CM | POA: Diagnosis not present

## 2014-02-23 ENCOUNTER — Ambulatory Visit (HOSPITAL_BASED_OUTPATIENT_CLINIC_OR_DEPARTMENT_OTHER): Payer: Medicare Other | Admitting: Internal Medicine

## 2014-02-23 ENCOUNTER — Encounter: Payer: Self-pay | Admitting: Internal Medicine

## 2014-02-23 ENCOUNTER — Other Ambulatory Visit (HOSPITAL_BASED_OUTPATIENT_CLINIC_OR_DEPARTMENT_OTHER): Payer: Medicare Other

## 2014-02-23 VITALS — BP 138/79 | HR 94 | Temp 98.6°F | Resp 18 | Ht 69.0 in | Wt 194.7 lb

## 2014-02-23 DIAGNOSIS — C7952 Secondary malignant neoplasm of bone marrow: Secondary | ICD-10-CM

## 2014-02-23 DIAGNOSIS — C3430 Malignant neoplasm of lower lobe, unspecified bronchus or lung: Secondary | ICD-10-CM

## 2014-02-23 DIAGNOSIS — C7951 Secondary malignant neoplasm of bone: Secondary | ICD-10-CM

## 2014-02-23 DIAGNOSIS — C3490 Malignant neoplasm of unspecified part of unspecified bronchus or lung: Secondary | ICD-10-CM

## 2014-02-23 DIAGNOSIS — R599 Enlarged lymph nodes, unspecified: Secondary | ICD-10-CM | POA: Diagnosis not present

## 2014-02-23 LAB — COMPREHENSIVE METABOLIC PANEL (CC13)
ALT: 19 U/L (ref 0–55)
AST: 17 U/L (ref 5–34)
Albumin: 4 g/dL (ref 3.5–5.0)
Alkaline Phosphatase: 71 U/L (ref 40–150)
Anion Gap: 9 mEq/L (ref 3–11)
BUN: 13.1 mg/dL (ref 7.0–26.0)
CO2: 27 mEq/L (ref 22–29)
Calcium: 10 mg/dL (ref 8.4–10.4)
Chloride: 107 mEq/L (ref 98–109)
Creatinine: 0.9 mg/dL (ref 0.6–1.1)
Glucose: 83 mg/dl (ref 70–140)
Potassium: 3.6 mEq/L (ref 3.5–5.1)
Sodium: 142 mEq/L (ref 136–145)
Total Bilirubin: 1.33 mg/dL — ABNORMAL HIGH (ref 0.20–1.20)
Total Protein: 7 g/dL (ref 6.4–8.3)

## 2014-02-23 LAB — CBC WITH DIFFERENTIAL/PLATELET
BASO%: 0.6 % (ref 0.0–2.0)
Basophils Absolute: 0 10*3/uL (ref 0.0–0.1)
EOS%: 5.6 % (ref 0.0–7.0)
Eosinophils Absolute: 0.2 10*3/uL (ref 0.0–0.5)
HCT: 42.8 % (ref 34.8–46.6)
HGB: 13.8 g/dL (ref 11.6–15.9)
LYMPH%: 14.4 % (ref 14.0–49.7)
MCH: 30 pg (ref 25.1–34.0)
MCHC: 32.3 g/dL (ref 31.5–36.0)
MCV: 92.9 fL (ref 79.5–101.0)
MONO#: 0.3 10*3/uL (ref 0.1–0.9)
MONO%: 6.8 % (ref 0.0–14.0)
NEUT#: 2.8 10*3/uL (ref 1.5–6.5)
NEUT%: 72.6 % (ref 38.4–76.8)
Platelets: 252 10*3/uL (ref 145–400)
RBC: 4.61 10*6/uL (ref 3.70–5.45)
RDW: 14.5 % (ref 11.2–14.5)
WBC: 3.8 10*3/uL — ABNORMAL LOW (ref 3.9–10.3)
lymph#: 0.6 10*3/uL — ABNORMAL LOW (ref 0.9–3.3)

## 2014-02-23 IMAGING — CT CT ABD-PELV W/ CM
4 of 9 series · 6 of 33 positions shown, 7 images · IV contrast (OMNIPAQUE)
Comparison: 12/10/2011

CT CHEST

CLINICAL DATA: Lung cancer diagnosed in 3223, 5333, and 9744.
Radiation therapy complete.  Partial thyroidectomy.  Chemotherapy
ongoing.  No current complaints.

CT CHEST, ABDOMEN AND PELVIS WITH CONTRAST
TECHNIQUE: Contiguous axial images of the chest abdomen and pelvis
were obtained after IV contrast administration.
Contrast: 100  ml [id number]

[Series 2: cap st · axial · 0.71mm/px · z∈[-511,-296]mm · 2 of 129 slices shown, 3 images]
[im 43/129  soft-tissue]
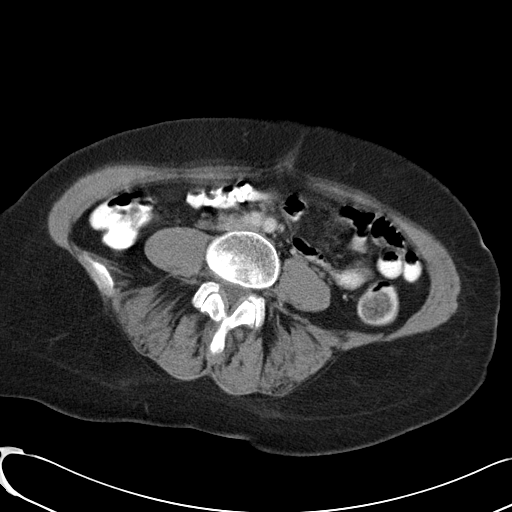
[im 43/129  bone]
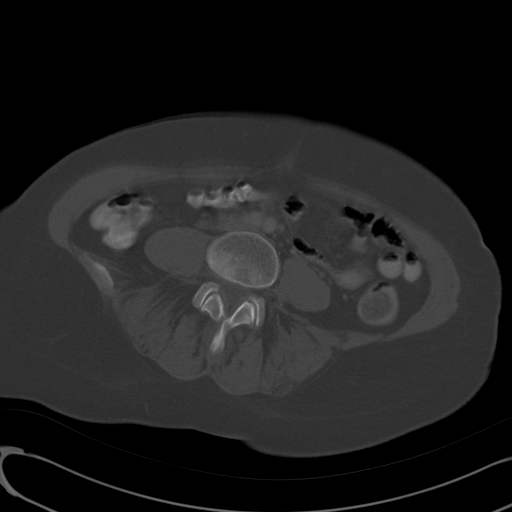
[im 86/129  soft-tissue]
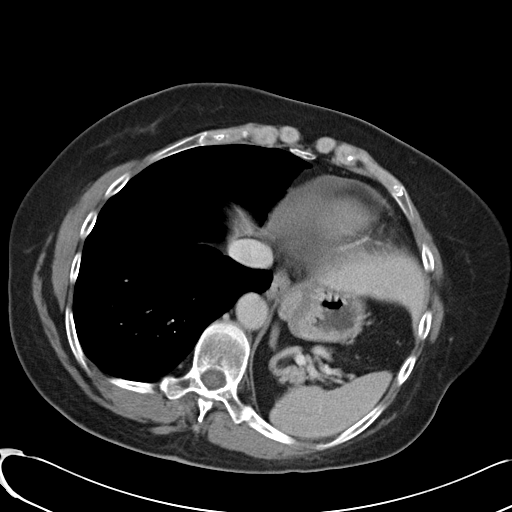

[Series 603: <mpr thick range(1)> · sagittal · 1.26mm/px · 1 of 114 slices shown]
[im 49/114  bone]
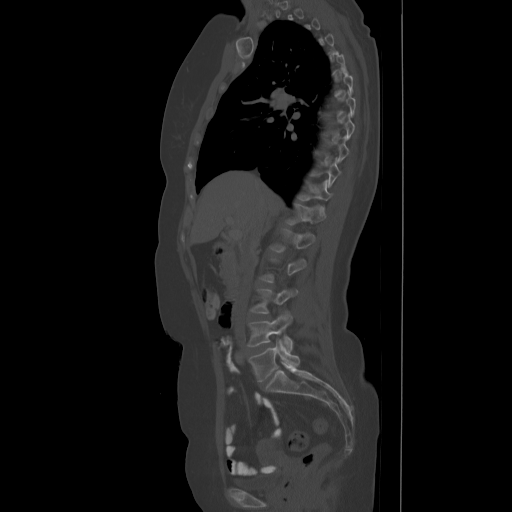

[Series 604: <mpr thick range(2)> · coronal · 0.51mm/px · 1 of 102 slices shown]
[im 51/102  bone]
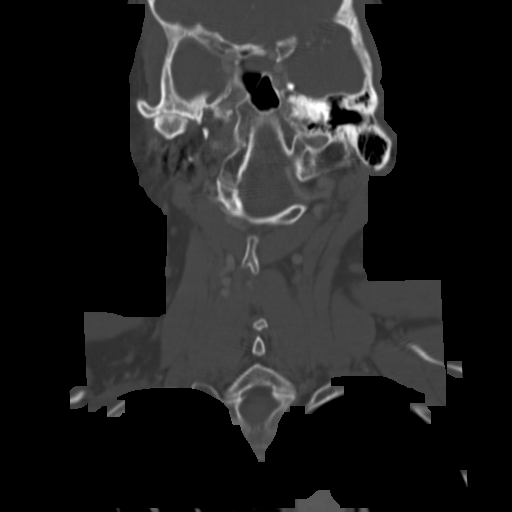

[Series 605: <mpr thick range(3)> · axial · 0.51mm/px · z∈[-83,+7]mm · 2 of 137 slices shown]
[im 46/137  soft-tissue]
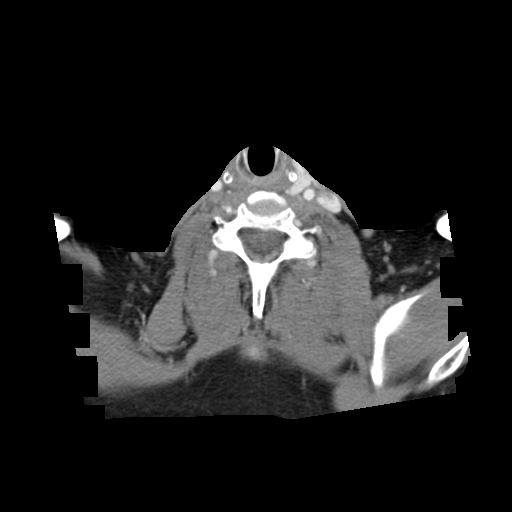
[im 91/137  soft-tissue]
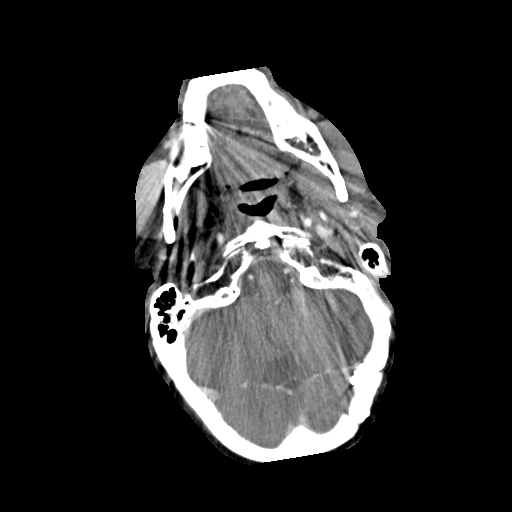

[6 of 33 positions shown; findings below may reference images not displayed]

FINDINGS: Lung windows demonstrate surgical changes within the left
lower lobe.  Stable 4 mm right upper lobe lung nodule on image 21.
A right lower lobe nodule is similar and subpleural in position,
and 3 mm on image 29.

A left lung nodule anteriorly measures 3 mm on image 16.
Minimal nodularity more inferiorly in the lateral left lung on
image 25 is similar.
A left lung base 5 mm nodule is similar on image 30.

A central right upper lobe nodule measures 7 mm on image 19 versus
6 mm on the prior.  Present 08/20/2011.

Similar configuration of left peri mediastinal volume loss and
traction bronchiectasis, likely related to radiation fibrosis.

Soft tissue windows demonstrate status post right thyroidectomy.
Tortuous descending thoracic aorta.  Mild cardiomegaly, with trace
pericardial fluid which is similar.  A left-sided pleural effusion
is tiny and not significantly changed.. No central pulmonary
embolism, on this non-dedicated study.

No mediastinal or hilar adenopathy.  The esophagus is mildly
dilated with a fluid level within.  Example image 24.
IMPRESSION: 1.  Surgical changes of left lower lobectomy.  Radiation changes
throughout the left hemithorax, without evidence of locally
recurrent disease.
2.  Nonspecific bilateral pulmonary nodules are primarily similar.
A right upper lobe nodule measures similar to 1 mm larger.  This
warrants followup attention.
3.  Similar small left-sided pleural effusion.
4. Esophageal air fluid level suggests dysmotility or
gastroesophageal reflux.

CT ABDOMEN AND PELVIS
FINDINGS: A tiny left liver lobe lesion which is similar and
likely a small cyst.  Normal spleen, stomach, pancreas,
gallbladder, biliary tract, adrenal glands, kidneys.  The left
renal collecting system is partially duplicated. No retroperitoneal
or retrocrural adenopathy.

Normal colon, appendix, and terminal ileum.  Minimal motion
degradation mid abdomen. Normal small bowel without abdominal
ascites.

  No pelvic adenopathy.  Normal urinary bladder, without adnexal
masses significant free pelvic fluid.  Atypical contour to the
uterine fundus is suspicious for a 3.3 cm fibroid.  Example
sagittal image 55.  This is unchanged.

Mild osteopenia.  Left-sided rib surgical defects.  A disc bulge at
L4-L5.
IMPRESSION: 1. No acute process or evidence of metastatic disease in the
abdomen or pelvis.
2.  Suspect uterine fundal fibroid.

## 2014-02-23 NOTE — Progress Notes (Signed)
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  BIOMARKERS TESTING ([facility]): Positive for: EGFR [id number] del, T790M, CTNNB1 D32G, SMAD4 T134f*10 Negative for: RET, ALK, BRAF, KRAS, ERBB2 and MET  PRIOR THERAPY:  1. Status post left lower lobectomy under the care of Dr BArlyce Dicein October 2002. 2. Status post course of concurrent chemoradiation with weekly carboplatin and paclitaxel under the care of Dr SBenay Spiceand Dr WElba Barmanin early 2003. 3. Status post [facility] 4. Status post left occipital craniotomy for tumor resection on September 21, 2005, under the care of Dr CChristella Noa 5. Status post whole brain irradiation under the care of Dr WElba Barman completed November 07, 2005. 6. Status post gamma knife [facility] 7. Status post right thyroidectomy with radical neck dissection under the care of Dr. RConstance Holsteron 10/18/2011. 8. Status post excisional biopsy of right cervical lymph node that was consistent with metastatic adenocarcinoma.  CURRENT THERAPY:  1) Tarceva 150 mg by mouth daily with therapy beginning 06/03/2007. 2) palliative radiotherapy to the enlarging right upper lobe lung nodule under the care of Dr. MValere Dross  CHEMOTHERAPY INTENT: palliative  CURRENT # OF CHEMOTHERAPY CYCLES: 79  CURRENT ANTIEMETICS: Compazine when necessary  CURRENT SMOKING STATUS: nonsmoker  ORAL CHEMOTHERAPY AND CONSENT: Tarceva  CURRENT BISPHOSPHONATES USE: None  PAIN MANAGEMENT: None  NARCOTICS INDUCED CONSTIPATION: None  LIVING WILL AND CODE STATUS: Full  code  INTERVAL HISTORY: Valerie STYRON68y.o. female returns to the clinic today for three-month follow up visit. She is still on Tarceva and tolerating it fairly well except for a few episodes of diarrhea. The patient is feeling fine today with no specific complaints except for mild fatigue. She denied having any significant chest pain, shortness of breath, cough or hemoptysis. The patient denied having any nausea or vomiting. She had no weight loss or night sweats. I [facility] in a clinical trial for the EGFR resistant mutation. She is expected to see him on 02/25/2014.  MEDICAL HISTORY: Past Medical History  Diagnosis Date  . Heart murmur   . Shortness of breath     hx lung ca   . Arthritis   . Lung cancer dx'd 2002  . Metastasis to brain dx'd 2008  . 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 Anal fistula   . H/O osteoporosis   . H/O vitamin D deficiency   . History of measles, mumps, or rubella   . H/O varicella   . Hypertension   . Yeast infection   . Abnormal Pap smear 2006  . Atrophic vaginitis 2008  . Dyspareunia 2008  . Dementia 2009  . Osteoporosis 2010  . Status post chemotherapy 2003    CARBOPLATIN/PACLITAXEL /STATUS POST CLINICAL TRAIL OF CELEBREX AND IRESSA AT [facility] FOR 1 YEAR  . Status post radiation therapy 2003    LEFT LUNG  . Status post radiation therapy 11/07/2005    WHOLE BRAIN: DR JLarkin [facility]  . Status post radiation therapy 06/02/2008    GAMMA  KNIFE OF RESECTED CAVITAY  . On antineoplastic chemotherapy     TARCEVA  . Nodule of right lung CT- 06/03/12    RIGHT UPPER LOBE  . Thyroid adenoma     ?  [name] Thyroid cancer 10/18/11 bx    adenoid nodules   . Anxiety   . Cataract   . Nodule of right lung 06/03/12    Upper Lobe  . Lung metastasis     PET scan 05/05/13, RUL lung nodule  . History of radiation therapy 07/28/13- 08/10/13    right lung  metastasis 5000 cGy 10 sessions    ALLERGIES:  has No Known Allergies.  MEDICATIONS:  Current Outpatient Prescriptions  Medication Sig Dispense Refill  . alendronate (FOSAMAX) 70 MG tablet Take 1 tablet (70 mg total) by mouth every 7 (seven) days. Saturdays; Take with a full glass of water on an empty stomach.  4 tablet  12  . Ascorbic Acid (VITAMIN C PO) Take 1 tablet by mouth daily. Takes twice a wek      . aspirin EC 81 MG tablet Take 81 mg by mouth every other day.       . Cholecalciferol (VITAMIN D3) 2000 UNITS TABS Take 2,000 Int'l Units by mouth every other day.       . donepezil (ARICEPT) 10 MG tablet Take 1 tablet (10 mg total) by mouth daily.  90 tablet  0  . erlotinib (TARCEVA) 150 MG tablet Take 1 tablet (150 mg total) by mouth daily. Take on an empty stomach 1 hour before meals or 2 hours after.  30 tablet  1  . loperamide (IMODIUM) 2 MG capsule Take 2 mg by mouth 4 (four) times daily as needed for diarrhea or loose stools.      . Multiple Vitamin (MULITIVITAMIN WITH MINERALS) TABS Take 1 tablet by mouth daily.      . naphazoline-pheniramine (NAPHCON-A) 0.025-0.3 % ophthalmic solution Place 1 drop into both eyes 4 (four) times daily as needed. For dry eyes      . [DISCONTINUED] erlotinib (TARCEVA) 150 MG tablet Take 1 tablet (150 mg total) by mouth daily. Take on an empty stomach 1 hour before meals or 2 hours after.  30 tablet  2   No current facility-administered medications for this visit.    SURGICAL HISTORY:  Past Surgical History  Procedure Laterality Date  . Left occipital craniotomy  09/21/2005    tumor  . Left lower lobectomy  05/2001  . Radical neck dissection  10/18/2011    Procedure: RADICAL NECK DISSECTION;  Surgeon: Izora Gala, MD;  Location: [place];  Service: ENT;  Laterality: Right;  RIGHT MODIFIED NECK DISSECTION /POSSIBLE RIGHT THYROIDECTOM  . Thyroidectomy  10/18/2011    Procedure: THYROIDECTOMY WITH RADICAL NECK DISSECTION;  Surgeon: Izora Gala, MD;   Location: [place];  Service: ENT;  Laterality: Right;    REVIEW OF SYSTEMS:  Constitutional: positive for fatigue Eyes: negative Ears, nose, mouth, throat, and face: negative Respiratory: negative Cardiovascular: negative Gastrointestinal: positive for diarrhea Genitourinary:negative Integument/breast: negative Hematologic/lymphatic: negative Musculoskeletal:negative Neurological: negative Behavioral/Psych: negative Endocrine: negative Allergic/Immunologic: negative   PHYSICAL EXAMINATION: General appearance: alert, cooperative, fatigued and no distress Head: Normocephalic, without obvious abnormality, atraumatic Neck: no adenopathy Lymph nodes: Cervical, supraclavicular, and axillary nodes normal. Resp: clear to auscultation bilaterally Cardio: regular rate and rhythm, S1, S2 normal, no murmur, click, rub or gallop GI: soft, non-tender; bowel sounds normal; no masses,  no organomegaly Extremities: extremities normal, atraumatic, no cyanosis or edema Neurologic:  Alert and oriented X 3, normal strength and tone. Normal symmetric reflexes. Normal coordination and gait  ECOG PERFORMANCE STATUS: 1 - Symptomatic but completely ambulatory  Blood pressure 138/79, pulse 94, temperature 98.6 F (37 C), temperature source Oral, resp. rate 18, height 5' 9" (1.753 m), weight 194 lb 11.2 oz (88.315 kg).  LABORATORY DATA: Lab Results  Component Value Date   WBC 3.8* 02/23/2014   HGB 13.8 02/23/2014   HCT 42.8 02/23/2014   MCV 92.9 02/23/2014   PLT 252 02/23/2014      Chemistry      Component Value Date/Time   NA 141 01/20/2014 0941   NA 138 02/28/2012 0939   NA 140 01/23/2012 0941   K 3.7 01/20/2014 0941   K 4.6 02/28/2012 0939   K 3.6 01/23/2012 0941   CL 106 12/09/2012 0806   CL 99 02/28/2012 0939   CL 105 01/23/2012 0941   CO2 23 01/20/2014 0941   CO2 28 02/28/2012 0939   CO2 26 01/23/2012 0941   BUN 14.8 01/20/2014 0941   BUN 12 02/28/2012 0939   BUN 11 01/23/2012 0941   CREATININE 0.8 01/20/2014 0941    CREATININE 1.0 02/28/2012 0939   CREATININE 0.83 01/23/2012 0941      Component Value Date/Time   CALCIUM 9.4 01/20/2014 0941   CALCIUM 9.1 02/28/2012 0939   CALCIUM 9.5 01/23/2012 0941   ALKPHOS 68 01/20/2014 0941   ALKPHOS 80 02/28/2012 0939   ALKPHOS 72 01/23/2012 0941   AST 14 01/20/2014 0941   AST 20 02/28/2012 0939   AST 17 01/23/2012 0941   ALT 16 01/20/2014 0941   ALT 21 02/28/2012 0939   ALT 17 01/23/2012 0941   BILITOT 0.94 01/20/2014 0941   BILITOT 1.30 02/28/2012 0939   BILITOT 1.3* 01/23/2012 0941       RADIOGRAPHIC STUDIES:  ASSESSMENT AND PLAN: this is a very pleasant 68 years old African American female with metastatic non-small cell lung cancer adenocarcinoma, with positive EGFR mutation but also resistant mutation T790M. She has been on treatment with oral Tarceva for more than 6 years.  Her recent scan showed enlargement of left subpectoral and axillary lymph node suspicious for nodal metastases. 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 She still on Tarceva until she gets enrolled on clinical trial for the resistant EGFR. She is expected to see Dr. Aniceto Boss in 2 days for evaluation of this option. I asked her to have a copy of her CT scan images available on CD from the [facility] radiology department before she goes to [facility]. I did not give him a followup appointment until after evaluation in [facility]. She was advised to call immediately if she has any concerning symptoms in the interval.  The patient voices understanding of current disease status and treatment options and is in agreement with the current care plan.  All questions were answered. The patient knows to call the clinic with any problems, questions or concerns. We can certainly see the patient much sooner if necessary.  Disclaimer: This note was dictated with voice recognition software. Similar sounding words can inadvertently be transcribed and may not be corrected upon review.

## 2014-02-25 DIAGNOSIS — C3490 Malignant neoplasm of unspecified part of unspecified bronchus or lung: Secondary | ICD-10-CM | POA: Diagnosis not present

## 2014-03-08 DIAGNOSIS — C3490 Malignant neoplasm of unspecified part of unspecified bronchus or lung: Secondary | ICD-10-CM | POA: Diagnosis not present

## 2014-03-08 DIAGNOSIS — R918 Other nonspecific abnormal finding of lung field: Secondary | ICD-10-CM | POA: Diagnosis not present

## 2014-03-15 DIAGNOSIS — C3490 Malignant neoplasm of unspecified part of unspecified bronchus or lung: Secondary | ICD-10-CM | POA: Diagnosis not present

## 2014-03-15 DIAGNOSIS — Z006 Encounter for examination for normal comparison and control in clinical research program: Secondary | ICD-10-CM | POA: Diagnosis not present

## 2014-03-25 DIAGNOSIS — C3490 Malignant neoplasm of unspecified part of unspecified bronchus or lung: Secondary | ICD-10-CM | POA: Diagnosis not present

## 2014-03-25 DIAGNOSIS — Z5111 Encounter for antineoplastic chemotherapy: Secondary | ICD-10-CM | POA: Diagnosis not present

## 2014-04-08 DIAGNOSIS — M810 Age-related osteoporosis without current pathological fracture: Secondary | ICD-10-CM | POA: Diagnosis not present

## 2014-04-08 DIAGNOSIS — Z800 Family history of malignant neoplasm of digestive organs: Secondary | ICD-10-CM | POA: Diagnosis not present

## 2014-04-08 DIAGNOSIS — C3490 Malignant neoplasm of unspecified part of unspecified bronchus or lung: Secondary | ICD-10-CM | POA: Diagnosis not present

## 2014-04-08 DIAGNOSIS — D869 Sarcoidosis, unspecified: Secondary | ICD-10-CM | POA: Diagnosis not present

## 2014-04-08 DIAGNOSIS — Z006 Encounter for examination for normal comparison and control in clinical research program: Secondary | ICD-10-CM | POA: Diagnosis not present

## 2014-04-08 DIAGNOSIS — E559 Vitamin D deficiency, unspecified: Secondary | ICD-10-CM | POA: Diagnosis not present

## 2014-04-08 DIAGNOSIS — Z8589 Personal history of malignant neoplasm of other organs and systems: Secondary | ICD-10-CM | POA: Diagnosis not present

## 2014-04-08 DIAGNOSIS — Z79899 Other long term (current) drug therapy: Secondary | ICD-10-CM | POA: Diagnosis not present

## 2014-04-08 DIAGNOSIS — F0390 Unspecified dementia without behavioral disturbance: Secondary | ICD-10-CM | POA: Diagnosis not present

## 2014-04-09 ENCOUNTER — Encounter (HOSPITAL_COMMUNITY): Payer: Self-pay

## 2014-04-13 DIAGNOSIS — W19XXXA Unspecified fall, initial encounter: Secondary | ICD-10-CM | POA: Diagnosis not present

## 2014-04-13 DIAGNOSIS — R488 Other symbolic dysfunctions: Secondary | ICD-10-CM | POA: Diagnosis not present

## 2014-04-13 DIAGNOSIS — M545 Low back pain, unspecified: Secondary | ICD-10-CM | POA: Diagnosis not present

## 2014-04-13 DIAGNOSIS — S8010XA Contusion of unspecified lower leg, initial encounter: Secondary | ICD-10-CM | POA: Diagnosis not present

## 2014-04-20 DIAGNOSIS — M545 Low back pain, unspecified: Secondary | ICD-10-CM | POA: Diagnosis not present

## 2014-04-20 DIAGNOSIS — Z859 Personal history of malignant neoplasm, unspecified: Secondary | ICD-10-CM | POA: Diagnosis not present

## 2014-04-20 DIAGNOSIS — IMO0001 Reserved for inherently not codable concepts without codable children: Secondary | ICD-10-CM | POA: Diagnosis not present

## 2014-04-20 DIAGNOSIS — Z9181 History of falling: Secondary | ICD-10-CM | POA: Diagnosis not present

## 2014-04-20 DIAGNOSIS — G3184 Mild cognitive impairment, so stated: Secondary | ICD-10-CM | POA: Diagnosis not present

## 2014-04-20 DIAGNOSIS — Z6830 Body mass index (BMI) 30.0-30.9, adult: Secondary | ICD-10-CM | POA: Diagnosis not present

## 2014-04-20 DIAGNOSIS — M6281 Muscle weakness (generalized): Secondary | ICD-10-CM | POA: Diagnosis not present

## 2014-04-23 DIAGNOSIS — Z859 Personal history of malignant neoplasm, unspecified: Secondary | ICD-10-CM | POA: Diagnosis not present

## 2014-04-23 DIAGNOSIS — G3184 Mild cognitive impairment, so stated: Secondary | ICD-10-CM | POA: Diagnosis not present

## 2014-04-23 DIAGNOSIS — M545 Low back pain, unspecified: Secondary | ICD-10-CM | POA: Diagnosis not present

## 2014-04-23 DIAGNOSIS — Z9181 History of falling: Secondary | ICD-10-CM | POA: Diagnosis not present

## 2014-04-23 DIAGNOSIS — IMO0001 Reserved for inherently not codable concepts without codable children: Secondary | ICD-10-CM | POA: Diagnosis not present

## 2014-04-23 DIAGNOSIS — M6281 Muscle weakness (generalized): Secondary | ICD-10-CM | POA: Diagnosis not present

## 2014-04-27 DIAGNOSIS — M6281 Muscle weakness (generalized): Secondary | ICD-10-CM | POA: Diagnosis not present

## 2014-04-27 DIAGNOSIS — Z859 Personal history of malignant neoplasm, unspecified: Secondary | ICD-10-CM | POA: Diagnosis not present

## 2014-04-27 DIAGNOSIS — G3184 Mild cognitive impairment, so stated: Secondary | ICD-10-CM | POA: Diagnosis not present

## 2014-04-27 DIAGNOSIS — IMO0001 Reserved for inherently not codable concepts without codable children: Secondary | ICD-10-CM | POA: Diagnosis not present

## 2014-04-27 DIAGNOSIS — M545 Low back pain, unspecified: Secondary | ICD-10-CM | POA: Diagnosis not present

## 2014-04-27 DIAGNOSIS — Z9181 History of falling: Secondary | ICD-10-CM | POA: Diagnosis not present

## 2014-04-28 DIAGNOSIS — Z859 Personal history of malignant neoplasm, unspecified: Secondary | ICD-10-CM | POA: Diagnosis not present

## 2014-04-28 DIAGNOSIS — Z9181 History of falling: Secondary | ICD-10-CM | POA: Diagnosis not present

## 2014-04-28 DIAGNOSIS — IMO0001 Reserved for inherently not codable concepts without codable children: Secondary | ICD-10-CM | POA: Diagnosis not present

## 2014-04-28 DIAGNOSIS — M545 Low back pain, unspecified: Secondary | ICD-10-CM | POA: Diagnosis not present

## 2014-04-28 DIAGNOSIS — G3184 Mild cognitive impairment, so stated: Secondary | ICD-10-CM | POA: Diagnosis not present

## 2014-04-28 DIAGNOSIS — M6281 Muscle weakness (generalized): Secondary | ICD-10-CM | POA: Diagnosis not present

## 2014-04-30 DIAGNOSIS — C7931 Secondary malignant neoplasm of brain: Secondary | ICD-10-CM | POA: Diagnosis not present

## 2014-04-30 DIAGNOSIS — C3490 Malignant neoplasm of unspecified part of unspecified bronchus or lung: Secondary | ICD-10-CM | POA: Diagnosis not present

## 2014-04-30 DIAGNOSIS — Z79899 Other long term (current) drug therapy: Secondary | ICD-10-CM | POA: Diagnosis not present

## 2014-04-30 DIAGNOSIS — C7949 Secondary malignant neoplasm of other parts of nervous system: Secondary | ICD-10-CM | POA: Diagnosis not present

## 2014-04-30 DIAGNOSIS — J90 Pleural effusion, not elsewhere classified: Secondary | ICD-10-CM | POA: Diagnosis not present

## 2014-04-30 DIAGNOSIS — Z006 Encounter for examination for normal comparison and control in clinical research program: Secondary | ICD-10-CM | POA: Diagnosis not present

## 2014-05-04 DIAGNOSIS — Z9181 History of falling: Secondary | ICD-10-CM | POA: Diagnosis not present

## 2014-05-04 DIAGNOSIS — Z859 Personal history of malignant neoplasm, unspecified: Secondary | ICD-10-CM | POA: Diagnosis not present

## 2014-05-04 DIAGNOSIS — M545 Low back pain, unspecified: Secondary | ICD-10-CM | POA: Diagnosis not present

## 2014-05-04 DIAGNOSIS — M6281 Muscle weakness (generalized): Secondary | ICD-10-CM | POA: Diagnosis not present

## 2014-05-04 DIAGNOSIS — G3184 Mild cognitive impairment, so stated: Secondary | ICD-10-CM | POA: Diagnosis not present

## 2014-05-04 DIAGNOSIS — IMO0001 Reserved for inherently not codable concepts without codable children: Secondary | ICD-10-CM | POA: Diagnosis not present

## 2014-05-07 DIAGNOSIS — M545 Low back pain, unspecified: Secondary | ICD-10-CM | POA: Diagnosis not present

## 2014-05-07 DIAGNOSIS — M6281 Muscle weakness (generalized): Secondary | ICD-10-CM | POA: Diagnosis not present

## 2014-05-07 DIAGNOSIS — IMO0001 Reserved for inherently not codable concepts without codable children: Secondary | ICD-10-CM | POA: Diagnosis not present

## 2014-05-07 DIAGNOSIS — Z859 Personal history of malignant neoplasm, unspecified: Secondary | ICD-10-CM | POA: Diagnosis not present

## 2014-05-07 DIAGNOSIS — G3184 Mild cognitive impairment, so stated: Secondary | ICD-10-CM | POA: Diagnosis not present

## 2014-05-07 DIAGNOSIS — Z9181 History of falling: Secondary | ICD-10-CM | POA: Diagnosis not present

## 2014-05-10 DIAGNOSIS — M545 Low back pain, unspecified: Secondary | ICD-10-CM | POA: Diagnosis not present

## 2014-05-10 DIAGNOSIS — Z9181 History of falling: Secondary | ICD-10-CM | POA: Diagnosis not present

## 2014-05-10 DIAGNOSIS — IMO0001 Reserved for inherently not codable concepts without codable children: Secondary | ICD-10-CM | POA: Diagnosis not present

## 2014-05-10 DIAGNOSIS — Z859 Personal history of malignant neoplasm, unspecified: Secondary | ICD-10-CM | POA: Diagnosis not present

## 2014-05-10 DIAGNOSIS — M6281 Muscle weakness (generalized): Secondary | ICD-10-CM | POA: Diagnosis not present

## 2014-05-10 DIAGNOSIS — G3184 Mild cognitive impairment, so stated: Secondary | ICD-10-CM | POA: Diagnosis not present

## 2014-05-20 DIAGNOSIS — Z23 Encounter for immunization: Secondary | ICD-10-CM | POA: Diagnosis not present

## 2014-05-20 DIAGNOSIS — C7931 Secondary malignant neoplasm of brain: Secondary | ICD-10-CM | POA: Diagnosis not present

## 2014-05-20 DIAGNOSIS — Z79899 Other long term (current) drug therapy: Secondary | ICD-10-CM | POA: Diagnosis not present

## 2014-05-20 DIAGNOSIS — F0390 Unspecified dementia without behavioral disturbance: Secondary | ICD-10-CM | POA: Diagnosis not present

## 2014-05-20 DIAGNOSIS — Z902 Acquired absence of lung [part of]: Secondary | ICD-10-CM | POA: Diagnosis not present

## 2014-05-20 DIAGNOSIS — R221 Localized swelling, mass and lump, neck: Secondary | ICD-10-CM | POA: Diagnosis not present

## 2014-05-20 DIAGNOSIS — Z7982 Long term (current) use of aspirin: Secondary | ICD-10-CM | POA: Diagnosis not present

## 2014-05-20 DIAGNOSIS — M810 Age-related osteoporosis without current pathological fracture: Secondary | ICD-10-CM | POA: Diagnosis not present

## 2014-05-20 DIAGNOSIS — C3490 Malignant neoplasm of unspecified part of unspecified bronchus or lung: Secondary | ICD-10-CM | POA: Diagnosis not present

## 2014-05-30 IMAGING — CT CT NECK W/ CM
4 of 9 series · 13 of 33 positions shown, 14 images · IV contrast (OMNIPAQUE)
Comparison: 02/28/2012.  12/10/2011.

CLINICAL DATA: Metastatic lung cancer with right supraclavicular
lymph node removal.  Previous right thyroidectomy.

CT NECK WITH CONTRAST
TECHNIQUE: Multidetector CT imaging of the neck was performed with
intravenous contrast.
Contrast: 100mL OMNIPAQUE IOHEXOL 300 MG/ML  SOLN

[Series 2: cap st · axial · 0.90mm/px · z∈[-630,-295]mm · 3 of 135 slices shown, 4 images]
[im 34/135  soft-tissue]
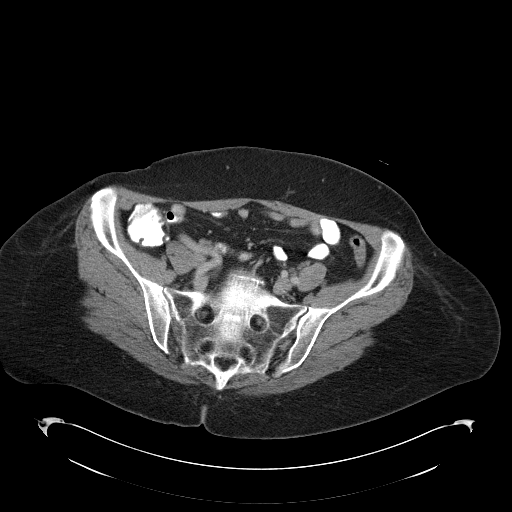
[im 34/135  bone]
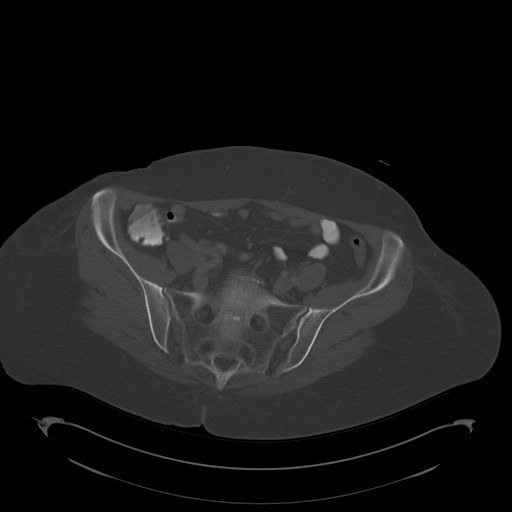
[im 68/135  bone]
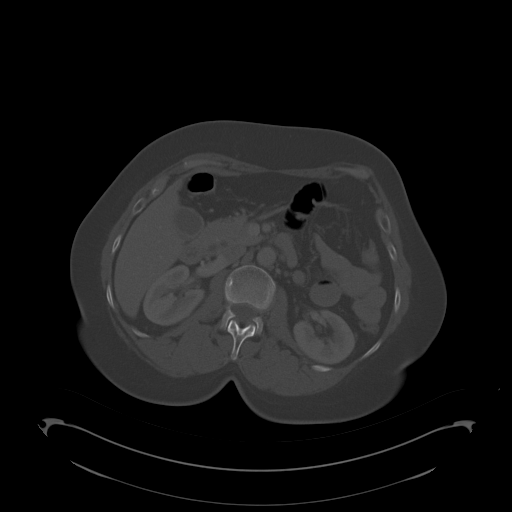
[im 101/135  bone]
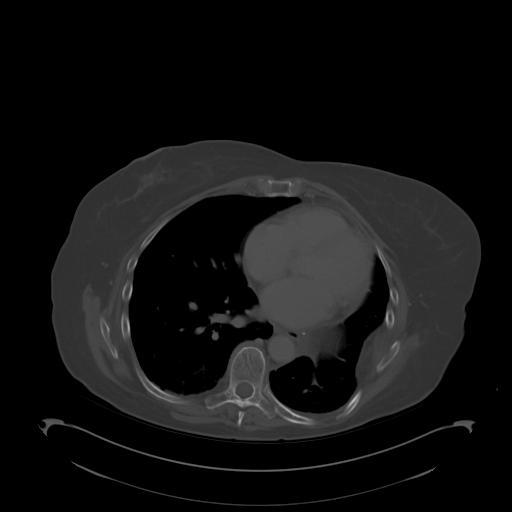

[Series 602: <mpr thick range> · axial · 0.40mm/px · z∈[-185,-104]mm · 2 of 129 slices shown]
[im 43/129  bone]
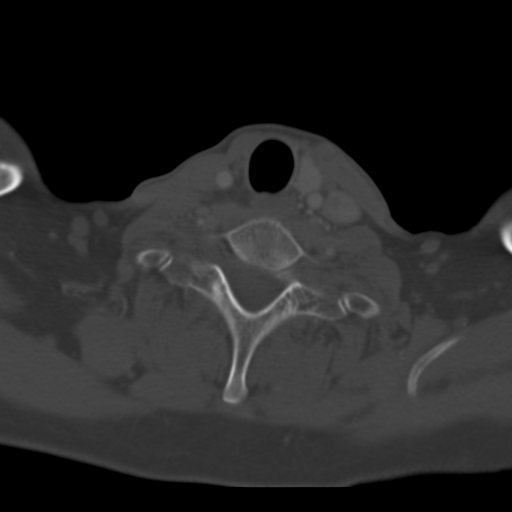
[im 86/129  bone]
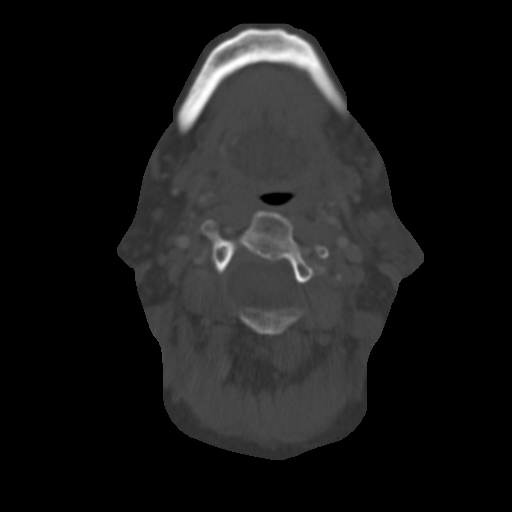

[Series 605: <mpr thick range(3)> · coronal · 1.32mm/px · 3 of 87 slices shown]
[im 22/87  bone]
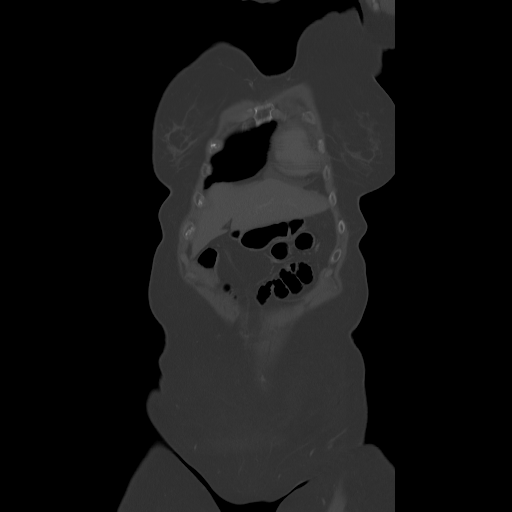
[im 44/87  bone]
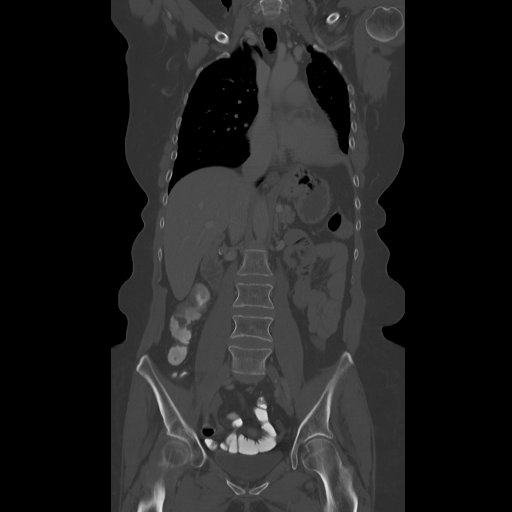
[im 65/87  bone]
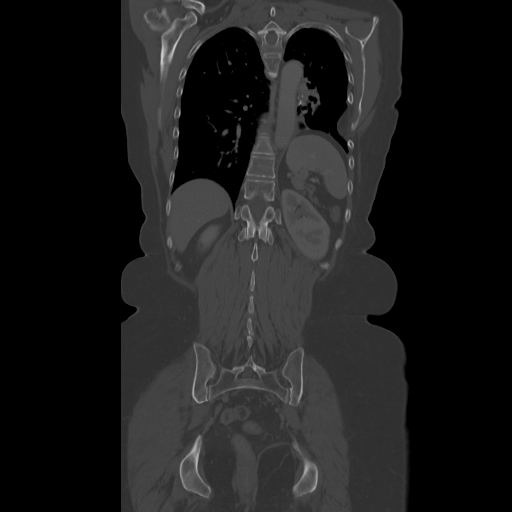

[Series 606: <mpr thick range(4)> · sagittal · 1.32mm/px · 5 of 113 slices shown]
[im 17/113  bone]
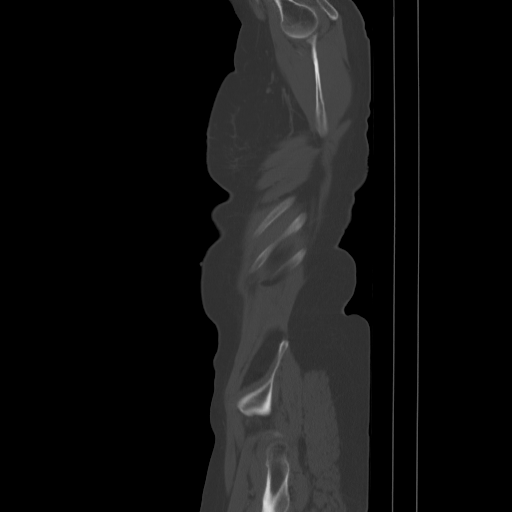
[im 33/113  bone]
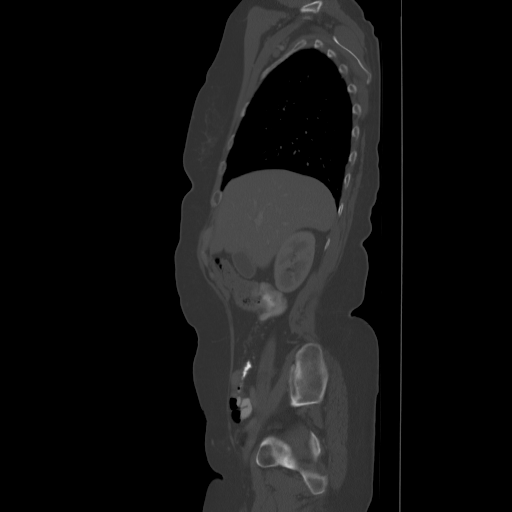
[im 49/113  bone]
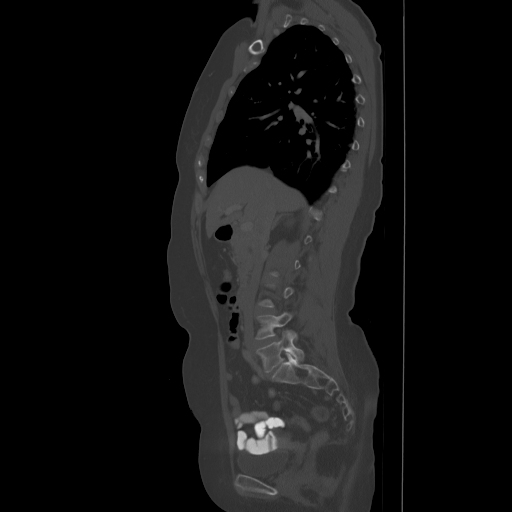
[im 65/113  bone]
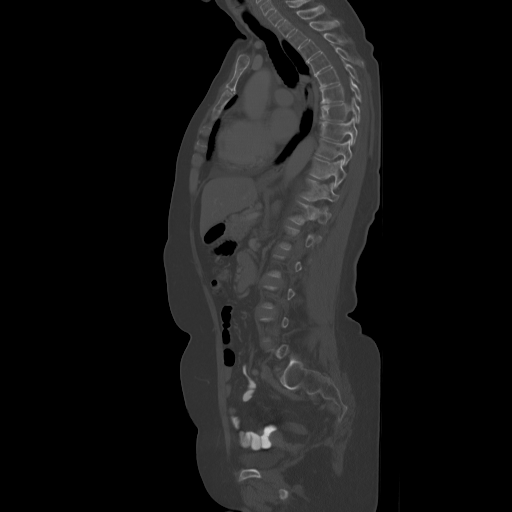
[im 81/113  bone]
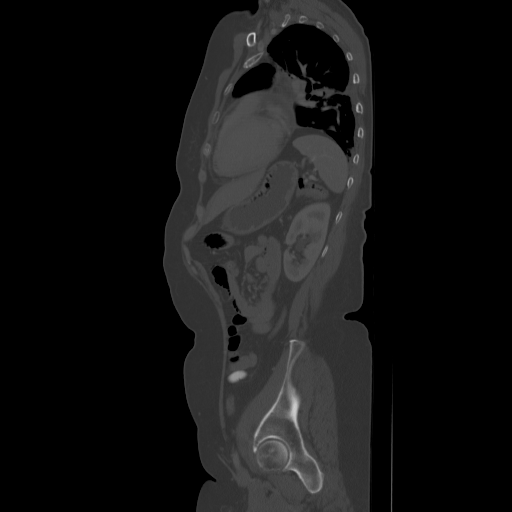

[13 of 33 positions shown; findings below may reference images not displayed]

FINDINGS: Slight interval regression 11 x 5 mm left internal
jugular noted (image 58 series 4). Loss of fat planes on the right
related to right thyroidectomy and right supraclavicular node
resection from metastatic lung cancer. Right thyroidectomy with
stable appearing left thyroid gland containing a few tiny cysts.
Airway midline.  Fatty replaced right parotid gland relates to
previous XRT.  No pathologic adenopathy.  No neck masses or mucosal
lesions. Slight asymmetry base of tongue  without definitive mass
lesion.  Carotid arteries widely patent with only mild nonstenotic
posterior wall calcific plaque bilaterally.  No worrisome osseous
lesions.  Right maxillary sinus opacity likely radicular cyst
related to right maxillary molar roots.
IMPRESSION: Stable CT neck.  No pathologic lymphadenopathy based on short axis
measurements.

## 2014-06-10 DIAGNOSIS — F0390 Unspecified dementia without behavioral disturbance: Secondary | ICD-10-CM | POA: Diagnosis not present

## 2014-06-10 DIAGNOSIS — Z006 Encounter for examination for normal comparison and control in clinical research program: Secondary | ICD-10-CM | POA: Diagnosis not present

## 2014-06-10 DIAGNOSIS — C3490 Malignant neoplasm of unspecified part of unspecified bronchus or lung: Secondary | ICD-10-CM | POA: Diagnosis not present

## 2014-06-10 DIAGNOSIS — J90 Pleural effusion, not elsewhere classified: Secondary | ICD-10-CM | POA: Diagnosis not present

## 2014-06-10 DIAGNOSIS — S2220XS Unspecified fracture of sternum, sequela: Secondary | ICD-10-CM | POA: Diagnosis not present

## 2014-06-10 DIAGNOSIS — R932 Abnormal findings on diagnostic imaging of liver and biliary tract: Secondary | ICD-10-CM | POA: Diagnosis not present

## 2014-06-10 DIAGNOSIS — C3400 Malignant neoplasm of unspecified main bronchus: Secondary | ICD-10-CM | POA: Diagnosis not present

## 2014-06-10 DIAGNOSIS — Z902 Acquired absence of lung [part of]: Secondary | ICD-10-CM | POA: Diagnosis not present

## 2014-06-10 DIAGNOSIS — M810 Age-related osteoporosis without current pathological fracture: Secondary | ICD-10-CM | POA: Diagnosis not present

## 2014-06-21 ENCOUNTER — Encounter: Payer: Self-pay | Admitting: Internal Medicine

## 2014-06-24 ENCOUNTER — Other Ambulatory Visit: Payer: Self-pay

## 2014-06-24 DIAGNOSIS — Z1231 Encounter for screening mammogram for malignant neoplasm of breast: Secondary | ICD-10-CM

## 2014-07-20 DIAGNOSIS — Z01419 Encounter for gynecological examination (general) (routine) without abnormal findings: Secondary | ICD-10-CM | POA: Diagnosis not present

## 2014-07-20 DIAGNOSIS — M818 Other osteoporosis without current pathological fracture: Secondary | ICD-10-CM | POA: Diagnosis not present

## 2014-07-27 ENCOUNTER — Ambulatory Visit
Admission: RE | Admit: 2014-07-27 | Discharge: 2014-07-27 | Disposition: A | Discharge status: Home / Self Care | Payer: Medicare Other | Source: Ambulatory Visit

## 2014-07-27 ENCOUNTER — Other Ambulatory Visit: Payer: Self-pay

## 2014-07-27 DIAGNOSIS — Z1231 Encounter for screening mammogram for malignant neoplasm of breast: Secondary | ICD-10-CM

## 2014-07-29 ENCOUNTER — Other Ambulatory Visit: Payer: Self-pay | Admitting: Obstetrics and Gynecology

## 2014-07-29 DIAGNOSIS — R928 Other abnormal and inconclusive findings on diagnostic imaging of breast: Secondary | ICD-10-CM

## 2014-08-16 ENCOUNTER — Ambulatory Visit
Admission: RE | Admit: 2014-08-16 | Discharge: 2014-08-16 | Disposition: A | Discharge status: Home / Self Care | Payer: Medicare Other | Source: Ambulatory Visit | Attending: Obstetrics and Gynecology | Admitting: Obstetrics and Gynecology

## 2014-08-16 DIAGNOSIS — R928 Other abnormal and inconclusive findings on diagnostic imaging of breast: Secondary | ICD-10-CM

## 2014-08-16 DIAGNOSIS — N6459 Other signs and symptoms in breast: Secondary | ICD-10-CM | POA: Diagnosis not present

## 2014-08-31 IMAGING — CT CT CHEST W/ CM
4 of 11 series · 15 of 36 positions shown, 17 images · IV contrast (OMNIPAQUE)
Comparison: CT [DATE] 6562, 02/27/2029

CT CHEST

CLINICAL DATA: 66-year-old female with initial stage IIb lung
adenocarcinoma.  The patient developed the brain metastasis in 9551
and underwent tumor resection and radiation.  The patient
subsequently developed metastatic adenopathy in the right neck and
local recurrence in the right lung.  Potential enlarging right lung
nodule.

CT CHEST, ABDOMEN AND PELVIS WITH CONTRAST
TECHNIQUE: Multidetector CT imaging of the chest, abdomen and
pelvis was performed following the standard protocol during bolus
administration of intravenous contrast.
Contrast: 125mL OMNIPAQUE IOHEXOL 300 MG/ML  SOLN

[Series 5: cap st · axial · 0.77mm/px · z∈[-808,-338]mm · 6 of 132 slices shown, 8 images]
[im 19/132  mediastinal]
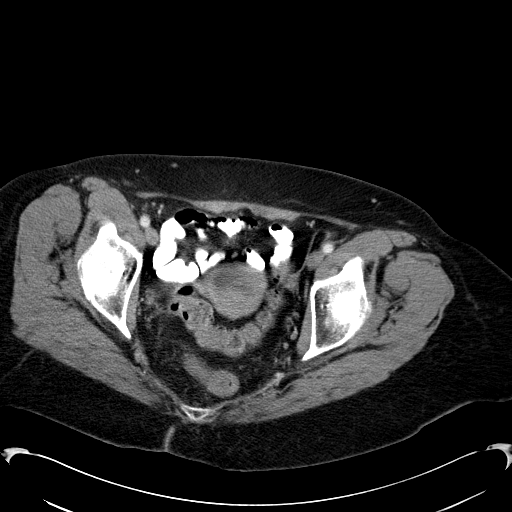
[im 19/132  lung]
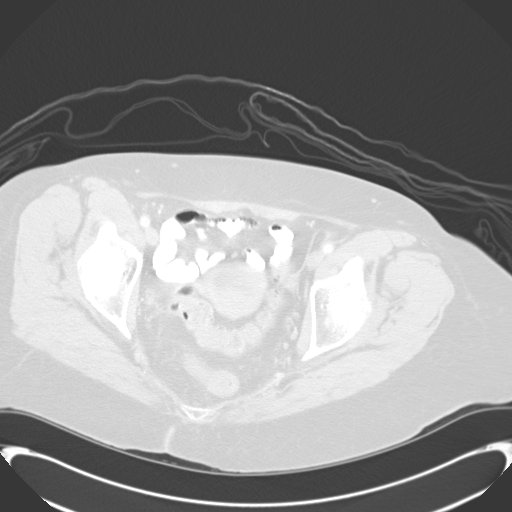
[im 38/132  lung]
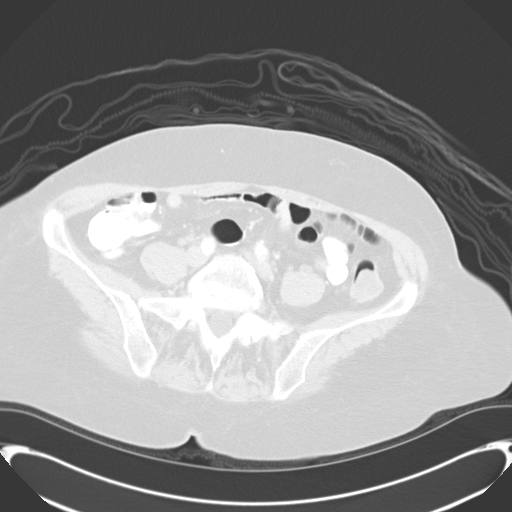
[im 57/132  lung]
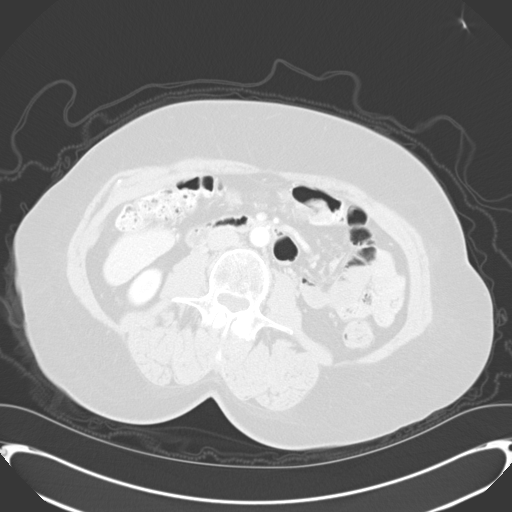
[im 75/132  lung]
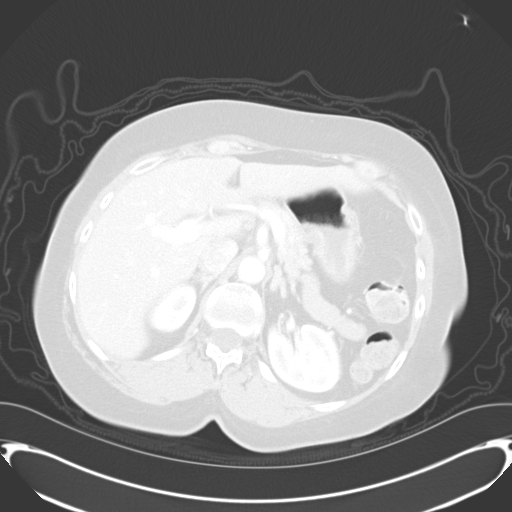
[im 94/132  mediastinal]
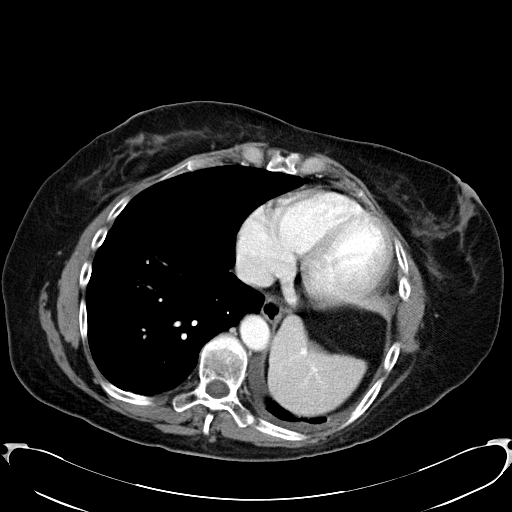
[im 94/132  lung]
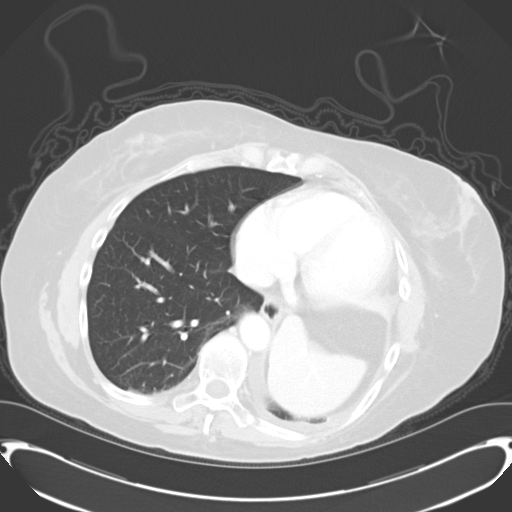
[im 113/132  lung]
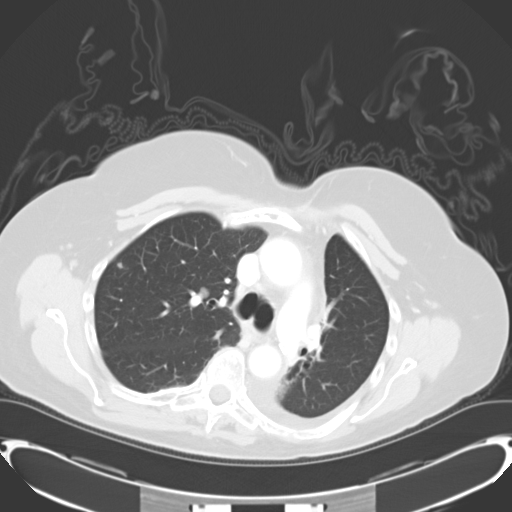

[Series 8: neck · axial · 0.46mm/px · z∈[-273,-153]mm · 3 of 80 slices shown]
[im 20/80  lung]
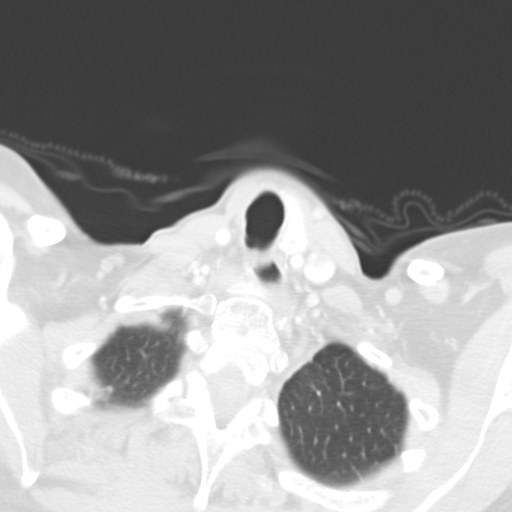
[im 40/80  lung]
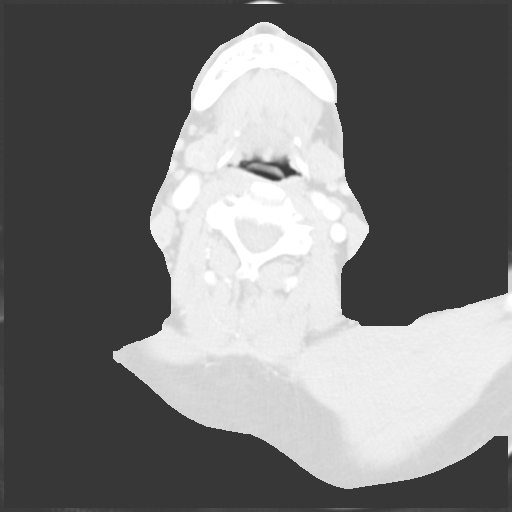
[im 60/80  lung]
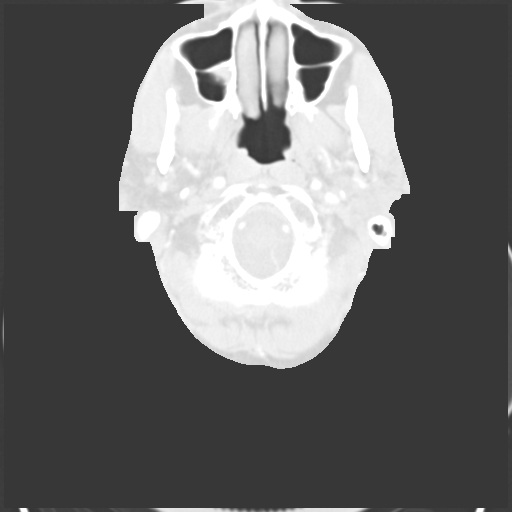

[Series 604: <mpr thick range(2)> · coronal · 0.47mm/px · 1 of 102 slices shown]
[im 51/102  lung]
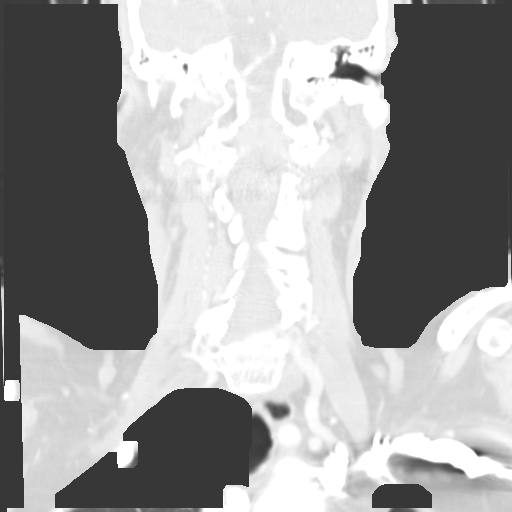

[Series 605: <mpr thick range(3)> · axial · 0.47mm/px · z∈[-304,-140]mm · 5 of 124 slices shown]
[im 21/124  lung]
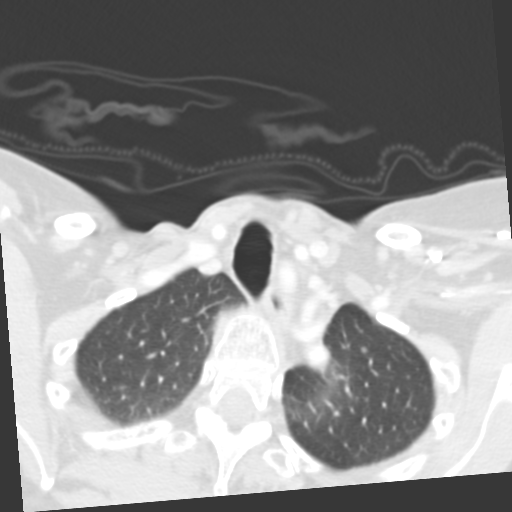
[im 42/124  lung]
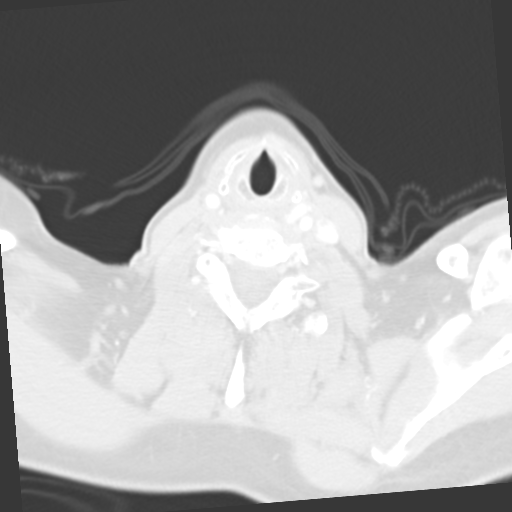
[im 62/124  lung]
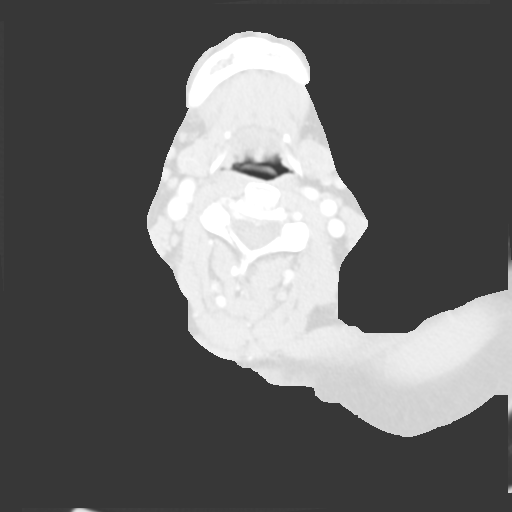
[im 83/124  lung]
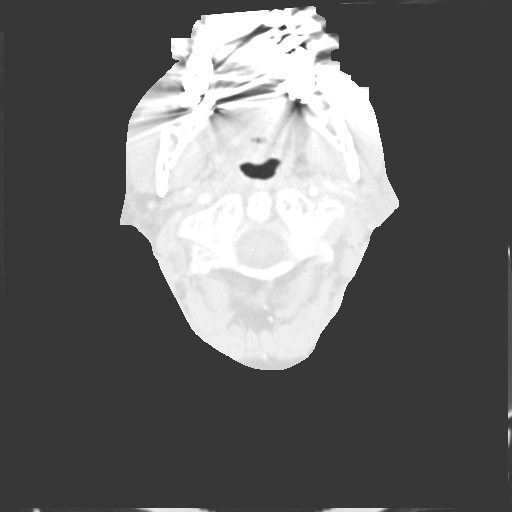
[im 103/124  lung]
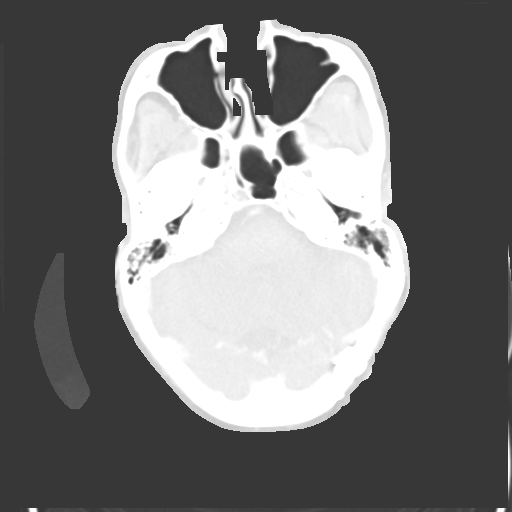

[15 of 36 positions shown; findings below may reference images not displayed]

FINDINGS: Within the right upper lobe, the central pulmonary
nodule of concern measures 10 mm x 9 mm increased from 8 mm x 8 mm
on most recent CT comparison from 06/03/2012 and from 7 mm x 7 mm
on CT of 02/28/2012.  More peripheral small nodule in the right
upper lobe is stable measuring 5 mm (image 20) compared to 4 mm on
prior.

There is postsurgical change in the left lung.  Lesion in the left
upper lobe measures 3 mm compared to 3 mm on prior (image 17).
Within the left lower lobe there is linear bronchiectasis and
pleural thickening which is stable.

There is no axillary or supraclavicular lymphadenopathy.  No
mediastinal or hilar lymphadenopathy.  No pericardial fluid.  There
is small amount of fluid in the esophagus which does not
demonstrate wall thickening.
IMPRESSION: 1..  Slow interval growth of right upper lobe pulmonary nodule now
measuring up to 10 mm is concerning for neoplasm.
2.  Stable smaller sub 5 mm pulmonary nodules.
3.  Stable postoperative change in the left lower lobe.

CT ABDOMEN AND PELVIS
FINDINGS: Tiny hypodensity in  the left hepatic lobe is unchanged.
The gallbladder, pancreas, spleen, adrenal glands, and kidneys are
normal.

The stomach, small bowel, cecum, appendix are normal.  The colon
and rectosigmoid colon are normal.

Abdominal aorta is normal caliber.  No retroperitoneal periportal
lymphadenopathy.

Within the pelvis, the uterus and bladder are normal.  No pelvic
lymphadenopathy. Review of  bone windows demonstrates no aggressive
osseous lesions.
IMPRESSION: 1..  No evidence of metastasis within the abdomen or pelvis.

## 2014-09-02 DIAGNOSIS — K868 Other specified diseases of pancreas: Secondary | ICD-10-CM | POA: Diagnosis not present

## 2014-09-02 DIAGNOSIS — K839 Disease of biliary tract, unspecified: Secondary | ICD-10-CM | POA: Diagnosis not present

## 2014-09-02 DIAGNOSIS — C3492 Malignant neoplasm of unspecified part of left bronchus or lung: Secondary | ICD-10-CM | POA: Diagnosis not present

## 2014-09-02 DIAGNOSIS — Z006 Encounter for examination for normal comparison and control in clinical research program: Secondary | ICD-10-CM | POA: Diagnosis not present

## 2014-09-02 DIAGNOSIS — M439 Deforming dorsopathy, unspecified: Secondary | ICD-10-CM | POA: Diagnosis not present

## 2014-09-02 DIAGNOSIS — Z79899 Other long term (current) drug therapy: Secondary | ICD-10-CM | POA: Diagnosis not present

## 2014-09-02 DIAGNOSIS — C3491 Malignant neoplasm of unspecified part of right bronchus or lung: Secondary | ICD-10-CM | POA: Diagnosis not present

## 2014-09-02 DIAGNOSIS — R911 Solitary pulmonary nodule: Secondary | ICD-10-CM | POA: Diagnosis not present

## 2014-09-02 DIAGNOSIS — C3400 Malignant neoplasm of unspecified main bronchus: Secondary | ICD-10-CM | POA: Diagnosis not present

## 2014-09-02 DIAGNOSIS — Z8781 Personal history of (healed) traumatic fracture: Secondary | ICD-10-CM | POA: Diagnosis not present

## 2014-09-02 DIAGNOSIS — I313 Pericardial effusion (noninflammatory): Secondary | ICD-10-CM | POA: Diagnosis not present

## 2014-09-23 DIAGNOSIS — C7989 Secondary malignant neoplasm of other specified sites: Secondary | ICD-10-CM | POA: Diagnosis not present

## 2014-09-23 DIAGNOSIS — C3400 Malignant neoplasm of unspecified main bronchus: Secondary | ICD-10-CM | POA: Diagnosis not present

## 2014-09-23 DIAGNOSIS — Z79899 Other long term (current) drug therapy: Secondary | ICD-10-CM | POA: Diagnosis not present

## 2014-09-23 DIAGNOSIS — F0390 Unspecified dementia without behavioral disturbance: Secondary | ICD-10-CM | POA: Diagnosis not present

## 2014-09-23 DIAGNOSIS — M810 Age-related osteoporosis without current pathological fracture: Secondary | ICD-10-CM | POA: Diagnosis not present

## 2014-09-23 DIAGNOSIS — C7931 Secondary malignant neoplasm of brain: Secondary | ICD-10-CM | POA: Diagnosis not present

## 2014-09-23 DIAGNOSIS — H269 Unspecified cataract: Secondary | ICD-10-CM | POA: Diagnosis not present

## 2014-09-27 DIAGNOSIS — C799 Secondary malignant neoplasm of unspecified site: Secondary | ICD-10-CM | POA: Diagnosis not present

## 2014-09-27 DIAGNOSIS — C3490 Malignant neoplasm of unspecified part of unspecified bronchus or lung: Secondary | ICD-10-CM | POA: Diagnosis not present

## 2014-09-27 DIAGNOSIS — G3184 Mild cognitive impairment, so stated: Secondary | ICD-10-CM | POA: Diagnosis not present

## 2014-09-27 DIAGNOSIS — E559 Vitamin D deficiency, unspecified: Secondary | ICD-10-CM | POA: Diagnosis not present

## 2014-09-27 DIAGNOSIS — Z79899 Other long term (current) drug therapy: Secondary | ICD-10-CM | POA: Diagnosis not present

## 2014-09-29 ENCOUNTER — Telehealth: Payer: Self-pay | Admitting: Internal Medicine

## 2014-09-29 NOTE — Telephone Encounter (Signed)
FAXED MEDICAL RECORDS TO [facility] ON 09/29/14 FAX NUMBER: [phone]

## 2014-09-30 ENCOUNTER — Emergency Department (HOSPITAL_COMMUNITY): Payer: Medicare Other

## 2014-09-30 ENCOUNTER — Emergency Department (HOSPITAL_COMMUNITY)
Admission: EM | Admit: 2014-09-30 | Discharge: 2014-09-30 | Disposition: A | Discharge status: Home / Self Care | Payer: Medicare Other | Source: Home / Self Care | Attending: Emergency Medicine | Admitting: Emergency Medicine

## 2014-09-30 DIAGNOSIS — F419 Anxiety disorder, unspecified: Secondary | ICD-10-CM

## 2014-09-30 DIAGNOSIS — M1990 Unspecified osteoarthritis, unspecified site: Secondary | ICD-10-CM

## 2014-09-30 DIAGNOSIS — Z7982 Long term (current) use of aspirin: Secondary | ICD-10-CM

## 2014-09-30 DIAGNOSIS — S99911A Unspecified injury of right ankle, initial encounter: Secondary | ICD-10-CM | POA: Diagnosis not present

## 2014-09-30 DIAGNOSIS — S82892A Other fracture of left lower leg, initial encounter for closed fracture: Principal | ICD-10-CM | POA: Diagnosis present

## 2014-09-30 DIAGNOSIS — Z85118 Personal history of other malignant neoplasm of bronchus and lung: Secondary | ICD-10-CM

## 2014-09-30 DIAGNOSIS — S99912A Unspecified injury of left ankle, initial encounter: Secondary | ICD-10-CM | POA: Diagnosis not present

## 2014-09-30 DIAGNOSIS — S82852A Displaced trimalleolar fracture of left lower leg, initial encounter for closed fracture: Secondary | ICD-10-CM | POA: Diagnosis not present

## 2014-09-30 DIAGNOSIS — Z79899 Other long term (current) drug therapy: Secondary | ICD-10-CM

## 2014-09-30 DIAGNOSIS — Z8589 Personal history of malignant neoplasm of other organs and systems: Secondary | ICD-10-CM

## 2014-09-30 DIAGNOSIS — C3490 Malignant neoplasm of unspecified part of unspecified bronchus or lung: Secondary | ICD-10-CM | POA: Diagnosis not present

## 2014-09-30 DIAGNOSIS — F0390 Unspecified dementia without behavioral disturbance: Secondary | ICD-10-CM

## 2014-09-30 DIAGNOSIS — S82831A Other fracture of upper and lower end of right fibula, initial encounter for closed fracture: Secondary | ICD-10-CM | POA: Diagnosis not present

## 2014-09-30 DIAGNOSIS — Z85841 Personal history of malignant neoplasm of brain: Secondary | ICD-10-CM

## 2014-09-30 DIAGNOSIS — R259 Unspecified abnormal involuntary movements: Secondary | ICD-10-CM | POA: Diagnosis not present

## 2014-09-30 DIAGNOSIS — N390 Urinary tract infection, site not specified: Secondary | ICD-10-CM

## 2014-09-30 DIAGNOSIS — R42 Dizziness and giddiness: Secondary | ICD-10-CM | POA: Diagnosis not present

## 2014-09-30 DIAGNOSIS — B9689 Other specified bacterial agents as the cause of diseases classified elsewhere: Secondary | ICD-10-CM | POA: Diagnosis not present

## 2014-09-30 DIAGNOSIS — Y92481 Parking lot as the place of occurrence of the external cause: Secondary | ICD-10-CM

## 2014-09-30 DIAGNOSIS — S99921A Unspecified injury of right foot, initial encounter: Secondary | ICD-10-CM | POA: Diagnosis not present

## 2014-09-30 DIAGNOSIS — S9305XA Dislocation of left ankle joint, initial encounter: Secondary | ICD-10-CM

## 2014-09-30 DIAGNOSIS — M79661 Pain in right lower leg: Secondary | ICD-10-CM | POA: Diagnosis not present

## 2014-09-30 DIAGNOSIS — Z923 Personal history of irradiation: Secondary | ICD-10-CM

## 2014-09-30 DIAGNOSIS — M810 Age-related osteoporosis without current pathological fracture: Secondary | ICD-10-CM

## 2014-09-30 DIAGNOSIS — I10 Essential (primary) hypertension: Secondary | ICD-10-CM | POA: Diagnosis present

## 2014-09-30 DIAGNOSIS — Z9221 Personal history of antineoplastic chemotherapy: Secondary | ICD-10-CM

## 2014-09-30 DIAGNOSIS — M25572 Pain in left ankle and joints of left foot: Secondary | ICD-10-CM | POA: Diagnosis not present

## 2014-09-30 DIAGNOSIS — W010XXA Fall on same level from slipping, tripping and stumbling without subsequent striking against object, initial encounter: Secondary | ICD-10-CM | POA: Diagnosis present

## 2014-09-30 DIAGNOSIS — R9431 Abnormal electrocardiogram [ECG] [EKG]: Secondary | ICD-10-CM | POA: Diagnosis not present

## 2014-09-30 DIAGNOSIS — S89291A Other physeal fracture of upper end of right fibula, initial encounter for closed fracture: Secondary | ICD-10-CM | POA: Diagnosis not present

## 2014-09-30 DIAGNOSIS — M79672 Pain in left foot: Secondary | ICD-10-CM | POA: Diagnosis not present

## 2014-09-30 LAB — URINALYSIS, ROUTINE W REFLEX MICROSCOPIC
Bilirubin Urine: NEGATIVE
Glucose, UA: NEGATIVE mg/dL
Ketones, ur: NEGATIVE mg/dL
Nitrite: POSITIVE — AB
Protein, ur: NEGATIVE mg/dL
Specific Gravity, Urine: 1.009 (ref 1.005–1.030)
Urobilinogen, UA: 0.2 mg/dL (ref 0.0–1.0)
pH: 5.5 (ref 5.0–8.0)

## 2014-09-30 LAB — CBC WITH DIFFERENTIAL/PLATELET
Basophils Absolute: 0 10*3/uL (ref 0.0–0.1)
Basophils Relative: 0 % (ref 0–1)
Eosinophils Absolute: 0 10*3/uL (ref 0.0–0.7)
Eosinophils Relative: 1 % (ref 0–5)
HCT: 40.4 % (ref 36.0–46.0)
Hemoglobin: 13.1 g/dL (ref 12.0–15.0)
Lymphocytes Relative: 9 % — ABNORMAL LOW (ref 12–46)
Lymphs Abs: 0.5 10*3/uL — ABNORMAL LOW (ref 0.7–4.0)
MCH: 29.6 pg (ref 26.0–34.0)
MCHC: 32.4 g/dL (ref 30.0–36.0)
MCV: 91.2 fL (ref 78.0–100.0)
Monocytes Absolute: 0.4 10*3/uL (ref 0.1–1.0)
Monocytes Relative: 8 % (ref 3–12)
Neutro Abs: 4.4 10*3/uL (ref 1.7–7.7)
Neutrophils Relative %: 82 % — ABNORMAL HIGH (ref 43–77)
Platelets: 155 10*3/uL (ref 150–400)
RBC: 4.43 MIL/uL (ref 3.87–5.11)
RDW: 13.8 % (ref 11.5–15.5)
WBC: 5.4 10*3/uL (ref 4.0–10.5)

## 2014-09-30 LAB — COMPREHENSIVE METABOLIC PANEL
ALT: 19 U/L (ref 0–35)
AST: 22 U/L (ref 0–37)
Albumin: 4 g/dL (ref 3.5–5.2)
Alkaline Phosphatase: 62 U/L (ref 39–117)
Anion gap: 9 (ref 5–15)
BUN: 11 mg/dL (ref 6–23)
CO2: 24 mmol/L (ref 19–32)
Calcium: 9.3 mg/dL (ref 8.4–10.5)
Chloride: 105 mmol/L (ref 96–112)
Creatinine, Ser: 0.83 mg/dL (ref 0.50–1.10)
GFR calc Af Amer: 82 mL/min — ABNORMAL LOW (ref >90)
GFR calc non Af Amer: 70 mL/min — ABNORMAL LOW (ref >90)
Glucose, Bld: 93 mg/dL (ref 70–99)
Potassium: 3.8 mmol/L (ref 3.5–5.1)
Sodium: 138 mmol/L (ref 135–145)
Total Bilirubin: 0.6 mg/dL (ref 0.3–1.2)
Total Protein: 6.6 g/dL (ref 6.0–8.3)

## 2014-09-30 LAB — URINE MICROSCOPIC-ADD ON

## 2014-09-30 MED ORDER — ONDANSETRON HCL 4 MG/2ML IJ SOLN
4.0000 mg | Freq: Once | INTRAMUSCULAR | Status: AC
  Administered 2014-09-30: 4 mg via INTRAVENOUS
  Filled 2014-09-30: qty 2

## 2014-09-30 MED ORDER — CEPHALEXIN 500 MG PO CAPS: 500.0000 mg | ORAL_CAPSULE | Freq: Four times a day (QID) | ORAL | Status: DC

## 2014-09-30 MED ORDER — HYDROMORPHONE HCL 1 MG/ML IJ SOLN
1.0000 mg | Freq: Once | INTRAMUSCULAR | Status: AC
Start: 2014-09-30 — End: 2014-09-30
  Administered 2014-09-30: 1 mg via INTRAVENOUS
  Filled 2014-09-30: qty 1

## 2014-09-30 MED ORDER — MIDAZOLAM HCL 2 MG/2ML IJ SOLN
2.0000 mg | Freq: Once | INTRAMUSCULAR | Status: AC
  Administered 2014-09-30: 2 mg via INTRAVENOUS
  Filled 2014-09-30: qty 2

## 2014-09-30 MED ORDER — CEPHALEXIN 500 MG PO CAPS
500.0000 mg | ORAL_CAPSULE | Freq: Once | ORAL | Status: AC
Start: 2014-09-30 — End: 2014-09-30
  Administered 2014-09-30: 500 mg via ORAL
  Filled 2014-09-30: qty 1

## 2014-09-30 MED ORDER — PROPOFOL 10 MG/ML IV BOLUS
INTRAVENOUS | Status: AC | PRN
  Administered 2014-09-30 (×2): 5 mg via INTRAVENOUS

## 2014-09-30 MED ORDER — PROPOFOL 10 MG/ML IV BOLUS
10.0000 mg | Freq: Once | INTRAVENOUS | Status: AC
  Administered 2014-09-30: 5 mg via INTRAVENOUS
  Filled 2014-09-30: qty 1

## 2014-09-30 MED ORDER — MORPHINE SULFATE 4 MG/ML IJ SOLN
4.0000 mg | Freq: Once | INTRAMUSCULAR | Status: AC
  Administered 2014-09-30: 4 mg via INTRAVENOUS
  Filled 2014-09-30: qty 1

## 2014-09-30 MED ORDER — SODIUM CHLORIDE 0.9 % IV SOLN
INTRAVENOUS | Status: DC
  Administered 2014-09-30: 14:00:00 via INTRAVENOUS

## 2014-09-30 MED ORDER — HYDROCODONE-ACETAMINOPHEN 5-325 MG PO TABS: 1.0000 | ORAL_TABLET | Freq: Four times a day (QID) | ORAL | Status: DC | PRN

## 2014-09-30 NOTE — ED Notes (Signed)
Per ems pt was picked from [facility], pt fell and injured left ankle. Per ems obvious ankle deformity. Pt  Denies loc of head injury. No anticoagulant intake. Hx lung Ca.

## 2014-09-30 NOTE — Discharge Instructions (Signed)
Follow-up with Dr. Mayer Camel from orthopedics, the number to call is [phone]. Keep the splint in place and dry keep leg elevated as much as possible. Did not put any weight on the left leg. Use the wheelchair and your walker at home as needed. Call orthopedic office for follow-up on Wednesday. They want to see on Wednesday. Take pain medicine as needed. Lab workup here today raised concerns for urinary tract infection take the Keflex as directed. Return for any new or worse symptoms.

## 2014-09-30 NOTE — ED Provider Notes (Addendum)
CSN: [id number]     Arrival date & time 09/30/14  1204 History   First MD Initiated Contact with Patient 09/30/14 1247     Chief Complaint  Patient presents with  . Ankle Injury     (Consider location/radiation/quality/duration/timing/severity/associated sxs/prior Treatment) Patient is a 69 y.o. female presenting with lower extremity injury. The history is provided by the patient, the EMS personnel and the spouse.  Ankle Injury Pertinent negatives include no chest pain, no abdominal pain, no headaches and no shortness of breath.   patient status post fall at [employer] parking lot. Injuring her left ankle. Brought in by EMS. Patient with obvious deformity to the left ankle. No other injuries did not pass out did not hit her head. Patient has some mild back discomfort but that preceded the fall.  Patient has a history of long-term lung cancer that's under special protocol treatment program from [facility].  Past Medical History  Diagnosis Date  . Heart murmur   . Shortness of breath     hx lung ca   . Arthritis   . Lung cancer dx'd 2002  . Metastasis to brain dx'd 2008  . 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 Anal fistula   . H/O osteoporosis   . H/O vitamin D deficiency   . History of measles, mumps, or rubella   . H/O varicella   . Hypertension   . Yeast infection   . Abnormal Pap smear 2006  . Atrophic vaginitis 2008  . Dyspareunia 2008  . Dementia 2009  . Osteoporosis 2010  . Status post chemotherapy 2003    CARBOPLATIN/PACLITAXEL /STATUS POST CLINICAL TRAIL OF CELEBREX AND IRESSA AT [facility] FOR 1 YEAR  . Status post radiation therapy 2003    LEFT LUNG  . Status post radiation therapy 11/07/2005    WHOLE BRAIN: DR Larkin [facility]  . Status post radiation therapy 06/02/2008    GAMMA KNIFE OF RESECTED CAVITAY  . On antineoplastic chemotherapy     TARCEVA  . Nodule of right lung CT- 06/03/12    RIGHT UPPER LOBE  . Thyroid adenoma     ?  [name] Thyroid cancer 10/18/11 bx    adenoid  nodules   . Anxiety   . Cataract   . Nodule of right lung 06/03/12    Upper Lobe  . Lung metastasis     PET scan 05/05/13, RUL lung nodule  . History of radiation therapy 07/28/13- 08/10/13    right lung metastasis 5000 cGy 10 sessions   Past Surgical History  Procedure Laterality Date  . Left occipital craniotomy  09/21/2005    tumor  . Left lower lobectomy  05/2001  . Radical neck dissection  10/18/2011    Procedure: RADICAL NECK DISSECTION;  Surgeon: Izora Gala, MD;  Location: [place];  Service: ENT;  Laterality: Right;  RIGHT MODIFIED NECK DISSECTION /POSSIBLE RIGHT THYROIDECTOM  . Thyroidectomy  10/18/2011    Procedure: THYROIDECTOMY WITH RADICAL NECK DISSECTION;  Surgeon: Izora Gala, MD;  Location: [facility] OR;  Service: ENT;  Laterality: Right;   Family History  Problem Relation Age of Onset  . Cancer Mother     Meningioma  . Gait disorder Mother   . Cancer Father     Pancreatic  . Cancer Maternal Aunt     menigeoma   History  Substance Use Topics  . Smoking status: Never Smoker   . Smokeless tobacco: Never Used     Comment: smoked few years in college  .  Alcohol Use: No   OB History    Gravida Para Term Preterm AB TAB SAB Ectopic Multiple Living   2         2     Review of Systems  Constitutional: Negative for fever.  HENT: Negative for congestion.   Eyes: Negative for visual disturbance.  Respiratory: Negative for shortness of breath.   Cardiovascular: Negative for chest pain.  Gastrointestinal: Negative for abdominal pain.  Genitourinary: Negative for dysuria.  Musculoskeletal: Positive for back pain. Negative for neck pain.  Skin: Positive for wound. Negative for rash.  Neurological: Negative for syncope and headaches.  Hematological: Does not bruise/bleed easily.  Psychiatric/Behavioral: Negative for confusion.      Allergies  Review of patient's allergies indicates no known allergies.  Home Medications   Prior to Admission medications   Medication Sig  Start Date End Date Taking? Authorizing Provider  alendronate (FOSAMAX) 70 MG tablet Take 1 tablet (70 mg total) by mouth every 7 (seven) days. Saturdays; Take with a full glass of water on an empty stomach. Patient taking differently: Take 70 mg by mouth at bedtime.  04/09/12  Yes Eldred Manges, MD  Ascorbic Acid (VITAMIN C PO) Take 1 tablet by mouth at bedtime.    Yes Historical Provider, MD  aspirin EC 81 MG tablet Take 81 mg by mouth at bedtime.    Yes Historical Provider, MD  Cholecalciferol (VITAMIN D3) 2000 UNITS TABS Take 2,000 Int'l Units by mouth at bedtime.    Yes Historical Provider, MD  donepezil (ARICEPT) 10 MG tablet Take 1 tablet (10 mg total) by mouth daily. 12/04/12  Yes Carmen Dohmeier, MD  ibuprofen (ADVIL,MOTRIN) 200 MG tablet Take 400 mg by mouth every 6 (six) hours as needed for moderate pain (pain).   Yes Historical Provider, MD  loperamide (IMODIUM) 2 MG capsule Take 2 mg by mouth 4 (four) times daily as needed for diarrhea or loose stools (diarrhea).    Yes Historical Provider, MD  Multiple Vitamin (MULITIVITAMIN WITH MINERALS) TABS Take 1 tablet by mouth at bedtime.    Yes Historical Provider, MD  STUDY MEDICATION Take 3 tablets by mouth at bedtime. Lung Cancer Medication. Take 3 Tablets (300 mg) By Mouth at Bedtime.   Yes Historical Provider, MD  cephALEXin (KEFLEX) 500 MG capsule Take 1 capsule (500 mg total) by mouth 4 (four) times daily. 09/30/14   Fredia Sorrow, MD  erlotinib (TARCEVA) 150 MG tablet Take 1 tablet (150 mg total) by mouth daily. Take on an empty stomach 1 hour before meals or 2 hours after. Patient not taking: Reported on 09/30/2014 01/12/14   Curt Bears, MD  HYDROcodone-acetaminophen (NORCO/VICODIN) 5-325 MG per tablet Take 1-2 tablets by mouth every 6 (six) hours as needed for moderate pain. 09/30/14   Fredia Sorrow, MD  naphazoline-pheniramine (NAPHCON-A) 0.025-0.3 % ophthalmic solution Place 1 drop into both eyes 4 (four) times daily as needed.  For dry eyes    Historical Provider, MD   BP 130/75 mmHg  Pulse 84  Temp(Src) 97.5 F (36.4 C) (Oral)  Resp 15  SpO2 100% Physical Exam  Constitutional: She is oriented to person, place, and time. She appears well-developed and well-nourished.  HENT:  Head: Normocephalic and atraumatic.  Eyes: Conjunctivae and EOM are normal. Pupils are equal, round, and reactive to light.  Neck: Normal range of motion.  Cardiovascular: Normal rate, regular rhythm and normal heart sounds.   Pulmonary/Chest: Effort normal and breath sounds normal. No respiratory distress.  Abdominal:  Bowel sounds are normal. There is no tenderness.  Musculoskeletal: Normal range of motion. She exhibits edema and tenderness.  Left ankle with obvious deformity abrasion on the medial malleolus but no evidence of an open fracture. Tenderness and swelling. Cap refill of the toes is 2 seconds. Sensation intact.  Neurological: She is alert and oriented to person, place, and time. No cranial nerve deficit. She exhibits normal muscle tone. Coordination normal.  Skin: Skin is warm. No rash noted.  Nursing note and vitals reviewed.   ED Course  Procedures (including critical care time) Labs Review Labs Reviewed  CBC WITH DIFFERENTIAL/PLATELET - Abnormal; Notable for the following:    Neutrophils Relative % 82 (*)    Lymphocytes Relative 9 (*)    Lymphs Abs 0.5 (*)    All other components within normal limits  COMPREHENSIVE METABOLIC PANEL - Abnormal; Notable for the following:    GFR calc non Af Amer 70 (*)    GFR calc Af Amer 82 (*)    All other components within normal limits  URINALYSIS, ROUTINE W REFLEX MICROSCOPIC - Abnormal; Notable for the following:    APPearance CLOUDY (*)    Hgb urine dipstick MODERATE (*)    Nitrite POSITIVE (*)    Leukocytes, UA MODERATE (*)    All other components within normal limits  URINE MICROSCOPIC-ADD ON - Abnormal; Notable for the following:    Bacteria, UA MANY (*)    All other  components within normal limits  URINE CULTURE    Imaging Review Dg Ankle 2 Views Left  09/30/2014   CLINICAL DATA:  Left ankle fracture dislocation. Followup getting into a car today. Postreduction images.  EXAM: LEFT ANKLE - 2 VIEW  COMPARISON:  09/30/2014  FINDINGS: Restored tibiotalar alignment. Posterior malleolar fracture with fractures involving the medial malleolus and segmental fracture of the distal fibular metadiaphysis. Mortise width uniform. Fiberglass splint in place and obscures bony detail.  IMPRESSION: 1. Restored tibiotalar alignment. Trimalleolar fracture. Mortise distance uniform currently.   Electronically Signed   By: Van Clines M.D.   On: 09/30/2014 15:44   Dg Ankle Complete Left  09/30/2014   CLINICAL DATA:  Fall with ankle deformity.  Initial encounter  EXAM: LEFT ANKLE COMPLETE - 3+ VIEW  COMPARISON:  None.  FINDINGS: Ankle fracture dislocation:  *There is a comminuted, segmental medially and anteriorly angulated distal fibular diaphysis fracture. *Posterior malleolus fracture involving 11 mm of the posterior articular surface, with wide distraction. *Comminuted medial malleolus fracturing which continues to the central plafond. *The ankle is dislocated posteriorly.  No hindfoot fracture or malalignment.  IMPRESSION: Ankle fracture/dislocation, as described above.   Electronically Signed   By: Monte Fantasia M.D.   On: 09/30/2014 13:56   Dg Chest Port 1 View  09/30/2014   CLINICAL DATA:  Recent ankle fracture dislocation with known history of lung carcinoma  EXAM: PORTABLE CHEST - 1 VIEW  COMPARISON:  01/20/2014  FINDINGS: Cardiac shadow is mildly enlarged but stable from the prior exam. Postsurgical changes are again seen in the left hilum. Central post radiation changes are again noted bilaterally. Elevation of left hemidiaphragm is again seen. No focal infiltrate or sizable effusion is noted.  IMPRESSION: Chronic changes related to the patient's known history of lung  carcinoma. No acute abnormality is seen.   Electronically Signed   By: Inez Catalina M.D.   On: 09/30/2014 15:43     EKG Interpretation   Date/Time:  Thursday September 30 2014 12:09:44 EST Ventricular  Rate:  90 PR Interval:  118 QRS Duration: 86 QT Interval:  383 QTC Calculation: 469 R Axis:   53 Text Interpretation:  Sinus rhythm Borderline short PR interval Minimal ST  depression, lateral leads Baseline wander in lead(s) V2 V6 Confirmed by  Rogene Houston  MD, SCOTT ([id number]) on 09/30/2014 2:15:46 PM      Procedural sedation Performed by: Fredia Sorrow Consent: Verbal consent obtained. Risks and benefits: risks, benefits and alternatives were discussed Required items: required blood products, implants, devices, and special equipment available Patient identity confirmed: arm band and provided demographic data Time out: Immediately prior to procedure a "time out" was called to verify the correct patient, procedure, equipment, support staff and site/side marked as required.  Sedation type: moderate (conscious) sedation NPO time confirmed and considedered  Sedatives: PROPOFOL  Physician Time at Bedside: 20  Vitals: Vital signs were monitored during sedation. Cardiac Monitor, pulse oximeter Patient tolerance: Patient tolerated the procedure well with no immediate complications. Comments: Pt with uneventful recovered. Returned to pre-procedural sedation baseline  in addition to the propofol patient was premedicated with 1 mg of hydromorphone. And 2 mg of Versed. Patient tolerated the procedure well. Blood pressure remained stable oxygen remained stable. Patient was placed on 2 L nasal cannula oxygen prior to the procedure. Timeout was performed.  Reduction of dislocation Date/Time: 4:09 PM Performed by: Fredia Sorrow Authorized by: Fredia Sorrow Consent: Verbal consent obtained. Risks and benefits: risks, benefits and alternatives were discussed Consent given by:  patient Required items: required blood products, implants, devices, and special equipment available Time out: Immediately prior to procedure a "time out" was called to verify the correct patient, procedure, equipment, support staff and site/side marked as required.  Patient sedated: With propofol  Vitals: Vital signs were monitored during sedation. Patient tolerance: Patient tolerated the procedure well with no immediate complications. Joint: Left ankle Reduction technique: Manual reduction     MDM   Final diagnoses:  Lung cancer  Ankle fracture, left, closed, initial encounter  Ankle dislocation, left, initial encounter  UTI (lower urinary tract infection)    Patient status post fall in parking lot at [employer]. Injuring left ankle. Brought in by EMS. Obvious closed dislocation fracture. No other injuries.  Patient underwent conscious sedation with propofol and the fracture dislocation was reduced and placed in a posterior stirrup splint. Assisted by me.  Dr. Mayer Camel from orthopedics contacted. Patient cleared to go home patient would prefer to go home. They will follow her up in the office on Wednesday. Patient will most likely require surgical repair. Patient has availability of a wheelchair and walker at home.  Patient's lab workup shows evidence of possible urinary tract infection urine culture has been sent due to the pending surgery patient started on the antibiotic Keflex.    Fredia Sorrow, MD 09/30/14 1608  Fredia Sorrow, MD 09/30/14 [phone]

## 2014-09-30 NOTE — ED Notes (Signed)
Bed: MH68 Expected date:  Expected time:  Means of arrival:  Comments: EMS FALL ANKLE INJURY

## 2014-10-01 ENCOUNTER — Inpatient Hospital Stay (HOSPITAL_COMMUNITY)
Admission: EM | Admit: 2014-10-01 | Discharge: 2014-10-01 | Disposition: A | Discharge status: Home / Self Care | Payer: Medicare Other | Source: Home / Self Care | Attending: Orthopedic Surgery | Admitting: Orthopedic Surgery

## 2014-10-01 ENCOUNTER — Emergency Department (HOSPITAL_COMMUNITY): Payer: Medicare Other

## 2014-10-01 ENCOUNTER — Encounter (HOSPITAL_COMMUNITY): Payer: Self-pay

## 2014-10-01 ENCOUNTER — Inpatient Hospital Stay (HOSPITAL_COMMUNITY)
Admission: EM | Admit: 2014-10-01 | Discharge: 2014-10-05 | DRG: 563 | Disposition: A | Discharge status: Home / Self Care | Payer: Medicare Other | Attending: Internal Medicine | Admitting: Internal Medicine

## 2014-10-01 ENCOUNTER — Encounter (HOSPITAL_COMMUNITY): Payer: Self-pay | Admitting: Internal Medicine

## 2014-10-01 DIAGNOSIS — M6281 Muscle weakness (generalized): Secondary | ICD-10-CM | POA: Diagnosis not present

## 2014-10-01 DIAGNOSIS — C3490 Malignant neoplasm of unspecified part of unspecified bronchus or lung: Secondary | ICD-10-CM | POA: Diagnosis present

## 2014-10-01 DIAGNOSIS — Z79899 Other long term (current) drug therapy: Secondary | ICD-10-CM

## 2014-10-01 DIAGNOSIS — C801 Malignant (primary) neoplasm, unspecified: Secondary | ICD-10-CM | POA: Diagnosis not present

## 2014-10-01 DIAGNOSIS — E876 Hypokalemia: Secondary | ICD-10-CM

## 2014-10-01 DIAGNOSIS — Z85850 Personal history of malignant neoplasm of thyroid: Secondary | ICD-10-CM

## 2014-10-01 DIAGNOSIS — S82892A Other fracture of left lower leg, initial encounter for closed fracture: Secondary | ICD-10-CM | POA: Diagnosis present

## 2014-10-01 DIAGNOSIS — S82852A Displaced trimalleolar fracture of left lower leg, initial encounter for closed fracture: Secondary | ICD-10-CM | POA: Diagnosis present

## 2014-10-01 DIAGNOSIS — W19XXXD Unspecified fall, subsequent encounter: Secondary | ICD-10-CM | POA: Diagnosis not present

## 2014-10-01 DIAGNOSIS — Z85118 Personal history of other malignant neoplasm of bronchus and lung: Secondary | ICD-10-CM | POA: Diagnosis not present

## 2014-10-01 DIAGNOSIS — Z7982 Long term (current) use of aspirin: Secondary | ICD-10-CM

## 2014-10-01 DIAGNOSIS — F419 Anxiety disorder, unspecified: Secondary | ICD-10-CM | POA: Diagnosis present

## 2014-10-01 DIAGNOSIS — G4700 Insomnia, unspecified: Secondary | ICD-10-CM | POA: Diagnosis not present

## 2014-10-01 DIAGNOSIS — W19XXXA Unspecified fall, initial encounter: Secondary | ICD-10-CM | POA: Diagnosis present

## 2014-10-01 DIAGNOSIS — S82892D Other fracture of left lower leg, subsequent encounter for closed fracture with routine healing: Secondary | ICD-10-CM

## 2014-10-01 DIAGNOSIS — F0390 Unspecified dementia without behavioral disturbance: Secondary | ICD-10-CM | POA: Diagnosis present

## 2014-10-01 DIAGNOSIS — M84572D Pathological fracture in neoplastic disease, left ankle, subsequent encounter for fracture with routine healing: Secondary | ICD-10-CM | POA: Diagnosis not present

## 2014-10-01 DIAGNOSIS — S82899A Other fracture of unspecified lower leg, initial encounter for closed fracture: Secondary | ICD-10-CM | POA: Diagnosis present

## 2014-10-01 DIAGNOSIS — S82401A Unspecified fracture of shaft of right fibula, initial encounter for closed fracture: Secondary | ICD-10-CM

## 2014-10-01 DIAGNOSIS — Z7983 Long term (current) use of bisphosphonates: Secondary | ICD-10-CM

## 2014-10-01 DIAGNOSIS — Z85841 Personal history of malignant neoplasm of brain: Secondary | ICD-10-CM | POA: Diagnosis not present

## 2014-10-01 DIAGNOSIS — R0602 Shortness of breath: Secondary | ICD-10-CM | POA: Diagnosis not present

## 2014-10-01 DIAGNOSIS — R0902 Hypoxemia: Secondary | ICD-10-CM

## 2014-10-01 DIAGNOSIS — C3411 Malignant neoplasm of upper lobe, right bronchus or lung: Secondary | ICD-10-CM | POA: Diagnosis present

## 2014-10-01 DIAGNOSIS — J90 Pleural effusion, not elsewhere classified: Secondary | ICD-10-CM | POA: Diagnosis not present

## 2014-10-01 DIAGNOSIS — S82409A Unspecified fracture of shaft of unspecified fibula, initial encounter for closed fracture: Secondary | ICD-10-CM | POA: Diagnosis present

## 2014-10-01 DIAGNOSIS — S82831A Other fracture of upper and lower end of right fibula, initial encounter for closed fracture: Secondary | ICD-10-CM | POA: Diagnosis present

## 2014-10-01 DIAGNOSIS — S82852B Displaced trimalleolar fracture of left lower leg, initial encounter for open fracture type I or II: Secondary | ICD-10-CM | POA: Diagnosis not present

## 2014-10-01 DIAGNOSIS — Z902 Acquired absence of lung [part of]: Secondary | ICD-10-CM | POA: Diagnosis present

## 2014-10-01 DIAGNOSIS — I10 Essential (primary) hypertension: Secondary | ICD-10-CM

## 2014-10-01 DIAGNOSIS — Z9221 Personal history of antineoplastic chemotherapy: Secondary | ICD-10-CM

## 2014-10-01 DIAGNOSIS — M25572 Pain in left ankle and joints of left foot: Secondary | ICD-10-CM | POA: Diagnosis not present

## 2014-10-01 DIAGNOSIS — C73 Malignant neoplasm of thyroid gland: Secondary | ICD-10-CM | POA: Diagnosis not present

## 2014-10-01 DIAGNOSIS — Z9181 History of falling: Secondary | ICD-10-CM | POA: Diagnosis not present

## 2014-10-01 DIAGNOSIS — N390 Urinary tract infection, site not specified: Secondary | ICD-10-CM | POA: Diagnosis present

## 2014-10-01 DIAGNOSIS — J4520 Mild intermittent asthma, uncomplicated: Secondary | ICD-10-CM | POA: Diagnosis not present

## 2014-10-01 DIAGNOSIS — T148 Other injury of unspecified body region: Secondary | ICD-10-CM | POA: Diagnosis not present

## 2014-10-01 DIAGNOSIS — M79661 Pain in right lower leg: Secondary | ICD-10-CM | POA: Diagnosis not present

## 2014-10-01 DIAGNOSIS — S99921A Unspecified injury of right foot, initial encounter: Secondary | ICD-10-CM | POA: Diagnosis not present

## 2014-10-01 DIAGNOSIS — M79672 Pain in left foot: Secondary | ICD-10-CM | POA: Diagnosis not present

## 2014-10-01 DIAGNOSIS — S99911A Unspecified injury of right ankle, initial encounter: Secondary | ICD-10-CM | POA: Diagnosis not present

## 2014-10-01 DIAGNOSIS — W010XXA Fall on same level from slipping, tripping and stumbling without subsequent striking against object, initial encounter: Secondary | ICD-10-CM | POA: Diagnosis present

## 2014-10-01 DIAGNOSIS — Z8589 Personal history of malignant neoplasm of other organs and systems: Secondary | ICD-10-CM | POA: Diagnosis not present

## 2014-10-01 DIAGNOSIS — J45909 Unspecified asthma, uncomplicated: Secondary | ICD-10-CM | POA: Diagnosis present

## 2014-10-01 DIAGNOSIS — Z809 Family history of malignant neoplasm, unspecified: Secondary | ICD-10-CM

## 2014-10-01 DIAGNOSIS — S82402A Unspecified fracture of shaft of left fibula, initial encounter for closed fracture: Secondary | ICD-10-CM | POA: Diagnosis not present

## 2014-10-01 DIAGNOSIS — R42 Dizziness and giddiness: Secondary | ICD-10-CM | POA: Diagnosis not present

## 2014-10-01 DIAGNOSIS — Y92481 Parking lot as the place of occurrence of the external cause: Secondary | ICD-10-CM | POA: Diagnosis not present

## 2014-10-01 DIAGNOSIS — M21272 Flexion deformity, left ankle and toes: Secondary | ICD-10-CM | POA: Diagnosis not present

## 2014-10-01 DIAGNOSIS — S82401D Unspecified fracture of shaft of right fibula, subsequent encounter for closed fracture with routine healing: Secondary | ICD-10-CM | POA: Diagnosis not present

## 2014-10-01 DIAGNOSIS — S99912A Unspecified injury of left ankle, initial encounter: Secondary | ICD-10-CM | POA: Diagnosis not present

## 2014-10-01 DIAGNOSIS — M810 Age-related osteoporosis without current pathological fracture: Secondary | ICD-10-CM | POA: Diagnosis present

## 2014-10-01 DIAGNOSIS — M1990 Unspecified osteoarthritis, unspecified site: Secondary | ICD-10-CM | POA: Diagnosis present

## 2014-10-01 DIAGNOSIS — Z923 Personal history of irradiation: Secondary | ICD-10-CM

## 2014-10-01 DIAGNOSIS — S89291A Other physeal fracture of upper end of right fibula, initial encounter for closed fracture: Secondary | ICD-10-CM | POA: Diagnosis not present

## 2014-10-01 HISTORY — DX: Other fracture of unspecified lower leg, initial encounter for closed fracture: S82.899A

## 2014-10-01 LAB — CBC WITH DIFFERENTIAL/PLATELET
Basophils Absolute: 0 10*3/uL (ref 0.0–0.1)
Basophils Relative: 0 % (ref 0–1)
Eosinophils Absolute: 0.1 10*3/uL (ref 0.0–0.7)
Eosinophils Relative: 1 % (ref 0–5)
HCT: 39.2 % (ref 36.0–46.0)
Hemoglobin: 12.5 g/dL (ref 12.0–15.0)
Lymphocytes Relative: 6 % — ABNORMAL LOW (ref 12–46)
Lymphs Abs: 0.5 10*3/uL — ABNORMAL LOW (ref 0.7–4.0)
MCH: 29.6 pg (ref 26.0–34.0)
MCHC: 31.9 g/dL (ref 30.0–36.0)
MCV: 92.7 fL (ref 78.0–100.0)
Monocytes Absolute: 0.6 10*3/uL (ref 0.1–1.0)
Monocytes Relative: 6 % (ref 3–12)
Neutro Abs: 8.1 10*3/uL — ABNORMAL HIGH (ref 1.7–7.7)
Neutrophils Relative %: 87 % — ABNORMAL HIGH (ref 43–77)
Platelets: 181 10*3/uL (ref 150–400)
RBC: 4.23 MIL/uL (ref 3.87–5.11)
RDW: 14 % (ref 11.5–15.5)
WBC: 9.3 10*3/uL (ref 4.0–10.5)

## 2014-10-01 LAB — BASIC METABOLIC PANEL
Anion gap: 10 (ref 5–15)
BUN: 11 mg/dL (ref 6–23)
CO2: 23 mmol/L (ref 19–32)
Calcium: 9.1 mg/dL (ref 8.4–10.5)
Chloride: 105 mmol/L (ref 96–112)
Creatinine, Ser: 0.89 mg/dL (ref 0.50–1.10)
GFR calc Af Amer: 75 mL/min — ABNORMAL LOW (ref >90)
GFR calc non Af Amer: 65 mL/min — ABNORMAL LOW (ref >90)
Glucose, Bld: 168 mg/dL — ABNORMAL HIGH (ref 70–99)
Potassium: 3.7 mmol/L (ref 3.5–5.1)
Sodium: 138 mmol/L (ref 135–145)

## 2014-10-01 MED ORDER — HYDROCODONE-ACETAMINOPHEN 10-325 MG PO TABS: 1.0000 | ORAL_TABLET | Freq: Once | ORAL | Status: DC

## 2014-10-01 MED ORDER — OXYCODONE-ACETAMINOPHEN 5-325 MG PO TABS
1.0000 | ORAL_TABLET | ORAL | Status: AC
  Administered 2014-10-01: 1 via ORAL
  Filled 2014-10-01: qty 1

## 2014-10-01 MED ORDER — HYDROCODONE-ACETAMINOPHEN 5-325 MG PO TABS
1.0000 | ORAL_TABLET | Freq: Once | ORAL | Status: AC
Start: 2014-10-01 — End: 2014-10-01
  Administered 2014-10-01: 1 via ORAL
  Filled 2014-10-01: qty 1

## 2014-10-01 MED ORDER — OXYCODONE HCL 5 MG PO TABS: 5.0000 mg | ORAL_TABLET | ORAL | Status: DC

## 2014-10-01 NOTE — ED Provider Notes (Signed)
CSN: [id number]     Arrival date & time 10/01/14  1636 History  This chart was scribed for non-physician practitioner,Josh Rondel Oh, PA-C working with Ephraim Hamburger, MD by Judithann Sauger, ED Scribe. The patient was seen in room WTR7/WTR7 and the patient's care was started at 6:33 PM      Chief Complaint  Patient presents with  . Ankle Pain   The history is provided by the patient. No language interpreter was used.   HPI Comments: Valerie [name] is a 69 y.o. female who presents to the Emergency Department status post fall that occurred yesterday. She was seen in the ED yesterday for her fall and had a left ankle fracture/dislocation that was reduced but is here today for a gradually worsening right ankle pain.The pt reports that she can move her toes on her right foot but she has not been able to ambulate since yesterday. She reports a bruise on the lateral aspect off her right foot and her mid thigh. She reports that she was prescribed Hydrocodone and has been compliant, having 3 doses in the last 24 hours with slight relief. The pt reports associated lightheadedness as if she was "seeing stars" about 2 hours after taking the medication and loss of appetite. She denies acute SOB and CP. She states that her lung cancer care is at [facility] and she is currently undergoing an experimental drug for about 6 months now with evaluation. Her husband states that she was referred to see Dr. Marcelino Scot for orthopedic trauma and was given an appointment on 10/06/14.  Past Medical History  Diagnosis Date  . Lung cancer   . Lung cancer   . Ankle fracture    Past Surgical History  Procedure Laterality Date  . Lung lobectomy    . Neck surgery    . Brain surgery     Family History  Problem Relation Age of Onset  . Cancer Mother   . Cancer Father   . Heart failure Sister    History  Substance Use Topics  . Smoking status: Never Smoker   . Smokeless tobacco: Never Used  . Alcohol Use: No   OB  History    No data available     Review of Systems  Constitutional: Positive for appetite change. Negative for fever and activity change.  HENT: Negative for rhinorrhea and sore throat.   Eyes: Negative for redness.  Respiratory: Negative for cough and shortness of breath.   Cardiovascular: Negative for chest pain.  Gastrointestinal: Negative for nausea, vomiting, abdominal pain and diarrhea.  Genitourinary: Negative for dysuria.  Musculoskeletal: Positive for myalgias, arthralgias (right ankle) and gait problem. Negative for back pain, joint swelling and neck pain.  Skin: Negative for rash and wound.  Neurological: Positive for light-headedness. Negative for weakness, numbness and headaches.      Allergies  Review of patient's allergies indicates not on file.  Home Medications   Prior to Admission medications   Not on File   BP 97/61 mmHg  Pulse 85  Temp(Src) 97.4 F (36.3 C) (Oral)  Resp 16  SpO2 96% Physical Exam  Constitutional: She is oriented to person, place, and time. She appears well-developed and well-nourished. No distress.  HENT:  Head: Normocephalic and atraumatic.  Eyes: Conjunctivae and EOM are normal. Pupils are equal, round, and reactive to light. Right eye exhibits no discharge. Left eye exhibits no discharge.  Neck: Normal range of motion. Neck supple. No tracheal deviation present.  Cardiovascular: Normal rate, regular rhythm  and normal heart sounds.  Exam reveals no decreased pulses.   Pulmonary/Chest: Effort normal and breath sounds normal. No respiratory distress.  Abdominal: Soft. There is no tenderness.  Musculoskeletal: She exhibits edema and tenderness.       Right hip: Normal.       Right knee: She exhibits effusion (mild). She exhibits normal range of motion. Tenderness (R proximal fibula) found. Lateral joint line tenderness noted.       Right ankle: She exhibits ecchymosis (medial). She exhibits normal range of motion and no swelling.  Tenderness. Medial malleolus and proximal fibula tenderness found. No lateral malleolus tenderness found. Achilles tendon normal.       Lumbar back: Normal.       Right upper leg: She exhibits tenderness. She exhibits no bony tenderness and no swelling (no ecchymosis seen laterally).       Right lower leg: Normal.       Right foot: There is tenderness. There is normal range of motion, no bony tenderness, no swelling and normal capillary refill.  L LE in short leg splint.   Neurological: She is alert and oriented to person, place, and time. No sensory deficit.  Motor, sensation, and vascular distal to the injury is fully intact.   Skin: Skin is warm and dry.  Psychiatric: She has a normal mood and affect. Her behavior is normal.  Nursing note and vitals reviewed.   ED Course  Procedures (including critical care time) DIAGNOSTIC STUDIES: Oxygen Saturation is 96% on RA, adequate by my interpretation.    COORDINATION OF CARE: 6:47 PM- Pt advised of plan for treatment and pt agrees.    Labs Review Labs Reviewed  CBC WITH DIFFERENTIAL/PLATELET - Abnormal; Notable for the following:    Neutrophils Relative % 87 (*)    Neutro Abs 8.1 (*)    Lymphocytes Relative 6 (*)    Lymphs Abs 0.5 (*)    All other components within normal limits  BASIC METABOLIC PANEL - Abnormal; Notable for the following:    Glucose, Bld 168 (*)    GFR calc non Af Amer 65 (*)    GFR calc Af Amer 75 (*)    All other components within normal limits  URINALYSIS, ROUTINE W REFLEX MICROSCOPIC    Imaging Review Dg Ankle Complete Right  10/01/2014   CLINICAL DATA:  69 year old female who fell yesterday ray with casted left ankle fracture. Right lower extremity pain. Initial encounter.  EXAM: RIGHT ANKLE - COMPLETE 3+ VIEW  COMPARISON:  None.  FINDINGS: Bone mineralization is within normal limits. In each calcaneus intact. Mortise joint alignment preserved. Talar dome intact. No definite joint effusion. No acute fracture  identified.  IMPRESSION: No acute fracture or dislocation identified about the right ankle.   Electronically Signed   By: Genevie Ann M.D.   On: 10/01/2014 19:27   Dg Knee Complete 4 Views Right  10/01/2014   CLINICAL DATA:  Fall yesterday. Right knee pain. Initial encounter.  EXAM: RIGHT KNEE - COMPLETE 4+ VIEW  COMPARISON:  None.  FINDINGS: Nondisplaced proximal fibular fracture is demonstrated. No other fractures are identified. No evidence of dislocation. No evidence of knee joint effusion. Mild degenerative spurring seen involving the patellofemoral compartment.  IMPRESSION: Nondisplaced proximal fibular fracture.   Electronically Signed   By: Earle Gell M.D.   On: 10/01/2014 19:28   Dg Foot Complete Right  10/01/2014   CLINICAL DATA:  Fall yesterday. Right lateral foot pain. Initial encounter.  EXAM: RIGHT FOOT  COMPLETE - 3+ VIEW  COMPARISON:  None.  FINDINGS: There is no evidence of fracture or dislocation. There is no evidence of arthropathy or other focal bone abnormality. Soft tissues are unremarkable.  IMPRESSION: Negative.   Electronically Signed   By: Earle Gell M.D.   On: 10/01/2014 19:27     EKG Interpretation   Date/Time:  Friday October 01 2014 19:58:07 EST Ventricular Rate:  91 PR Interval:  112 QRS Duration: 87 QT Interval:  393 QTC Calculation: 483 R Axis:   59 Text Interpretation:  Normal sinus rhythm Borderline short PR interval  Baseline wander in lead(s) I II III aVR aVF V1 V3 Otherwise normal ECG No  old tracing to compare Confirmed by GOLDSTON  MD, SCOTT ([id number]) on  10/01/2014 8:07:10 PM       Pt discussed with and seen by Dr. Regenia Skeeter.   Fracture identified as above.   Will need admit.   [facility] orth[facility] page pending.  8:31 PM Handoff to Dr. Regenia Skeeter who will admit.   MDM   Final diagnoses:  Fracture of right proximal fibula, closed, initial encounter  Fracture dislocation of left ankle joint, closed, with routine healing, subsequent encounter   Admit.    I personally performed the services described in this documentation, which was scribed in my presence. The recorded information has been reviewed and is accurate.    Carlisle Cater, PA-C 10/01/14 2031  Ephraim Hamburger, MD 10/04/14 [phone]

## 2014-10-01 NOTE — Progress Notes (Signed)
CSW attempted to speak with pt. However, nurse was present at bedside with curtain closed for privacy.  Willette Brace [phone] ED CSW 10/01/2014 11:50 PM

## 2014-10-01 NOTE — ED Notes (Signed)
Per EMS-fall yesterday with left ankle fracture, and tib fib dislocation-was seen and treated here yesterday-now here because right ankle discomfort and husband wants it evaluated

## 2014-10-01 NOTE — Consult Note (Signed)
Reason for Consult:status post left ankle fracture dislocation with inability to stand or walk at home. Referring Physician: hospitalist  Valerie [name] is an 68 y.o. female.  HPI: the patient is a 68-year-old female who fell suffering a fracture dislocation of her left ankle.  She underwent manipulative close reduction and compressive splinting and was discharged home.  She was having increased difficulty at home and I was consult by Dr. Michael handy to help out with treating the patient.  I told him I be happy to help out and the plan was devised was to have her come to my office today to be scheduled for surgery later today.  She unfortunately was unable to take care of herself at home and presented to the emergency room last night.  X-rays suggested that she had an opposite side proximal fibula fracture and she was admitted for inability to 2 take care of herself and for surgery on the left ankle.  Past Medical History  Diagnosis Date  . Lung cancer   . Lung cancer   . Ankle fracture     Past Surgical History  Procedure Laterality Date  . Lung lobectomy    . Neck surgery    . Brain surgery      Family History  Problem Relation Age of Onset  . Cancer Mother   . Cancer Father   . Heart failure Sister     Social History:  reports that she has never smoked. She has never used smokeless tobacco. She reports that she does not drink alcohol or use illicit drugs.  Allergies: No Known Allergies  Medications: I have reviewed the patient's current medications.  Results for orders placed or performed during the hospital encounter of 10/01/14 (from the past 48 hour(s))  CBC with Differential/Platelet     Status: Abnormal   Collection Time: 10/01/14  7:18 PM  Result Value Ref Range   WBC 9.3 4.0 - 10.5 K/uL   RBC 4.23 3.87 - 5.11 MIL/uL   Hemoglobin 12.5 12.0 - 15.0 g/dL   HCT 39.2 36.0 - 46.0 %   MCV 92.7 78.0 - 100.0 fL   MCH 29.6 26.0 - 34.0 pg   MCHC 31.9 30.0 - 36.0 g/dL   RDW 14.0 11.5 - 15.5 %   Platelets 181 150 - 400 K/uL   Neutrophils Relative % 87 (H) 43 - 77 %   Neutro Abs 8.1 (H) 1.7 - 7.7 K/uL   Lymphocytes Relative 6 (L) 12 - 46 %   Lymphs Abs 0.5 (L) 0.7 - 4.0 K/uL   Monocytes Relative 6 3 - 12 %   Monocytes Absolute 0.6 0.1 - 1.0 K/uL   Eosinophils Relative 1 0 - 5 %   Eosinophils Absolute 0.1 0.0 - 0.7 K/uL   Basophils Relative 0 0 - 1 %   Basophils Absolute 0.0 0.0 - 0.1 K/uL  Basic metabolic panel     Status: Abnormal   Collection Time: 10/01/14  7:18 PM  Result Value Ref Range   Sodium 138 135 - 145 mmol/L   Potassium 3.7 3.5 - 5.1 mmol/L   Chloride 105 96 - 112 mmol/L   CO2 23 19 - 32 mmol/L   Glucose, Bld 168 (H) 70 - 99 mg/dL   BUN 11 6 - 23 mg/dL   Creatinine, Ser 0.89 0.50 - 1.10 mg/dL   Calcium 9.1 8.4 - 10.5 mg/dL   GFR calc non Af Amer 65 (L) >90 mL/min   GFR calc Af Amer 75 (  L) >90 mL/min    Comment: (NOTE) The eGFR has been calculated using the CKD EPI equation. This calculation has not been validated in all clinical situations. eGFR's persistently <90 mL/min signify possible Chronic Kidney Disease.    Anion gap 10 5 - 15    Dg Ankle Complete Right  10/01/2014   CLINICAL DATA:  68-year-old female who fell yesterday ray with casted left ankle fracture. Right lower extremity pain. Initial encounter.  EXAM: RIGHT ANKLE - COMPLETE 3+ VIEW  COMPARISON:  None.  FINDINGS: Bone mineralization is within normal limits. In each calcaneus intact. Mortise joint alignment preserved. Talar dome intact. No definite joint effusion. No acute fracture identified.  IMPRESSION: No acute fracture or dislocation identified about the right ankle.   Electronically Signed   By: H  Hall M.D.   On: 10/01/2014 19:27   Dg Knee Complete 4 Views Right  10/01/2014   CLINICAL DATA:  Fall yesterday. Right knee pain. Initial encounter.  EXAM: RIGHT KNEE - COMPLETE 4+ VIEW  COMPARISON:  None.  FINDINGS: Nondisplaced proximal fibular fracture is demonstrated.  No other fractures are identified. No evidence of dislocation. No evidence of knee joint effusion. Mild degenerative spurring seen involving the patellofemoral compartment.  IMPRESSION: Nondisplaced proximal fibular fracture.   Electronically Signed   By: John  Stahl M.D.   On: 10/01/2014 19:28   Dg Foot Complete Right  10/01/2014   CLINICAL DATA:  Fall yesterday. Right lateral foot pain. Initial encounter.  EXAM: RIGHT FOOT COMPLETE - 3+ VIEW  COMPARISON:  None.  FINDINGS: There is no evidence of fracture or dislocation. There is no evidence of arthropathy or other focal bone abnormality. Soft tissues are unremarkable.  IMPRESSION: Negative.   Electronically Signed   By: John  Stahl M.D.   On: 10/01/2014 19:27    ROS  ROS: I have reviewed the patient's review of systems thoroughly and there are no positive responses as relates to the HPI. Exam: Blood pressure 97/61, pulse 85, temperature 97.4 F (36.3 C), temperature source Oral, resp. rate 16, SpO2 96 %. Physical Exam Well-developed well-nourished patient in no acute distress. Alert and oriented x3 HEENT:within normal limits Cardiac: Regular rate and rhythm Pulmonary: Lungs clear to auscultation Abdomen: Soft and nontender.  Normal active bowel sounds  Musculoskeletal: (left ankle in a compressive splint.  She is neurovascularly intact distally.  Right knee is tender palpation of the lateral aspect.  There is no instability.  There's mild pain with range of motion. Assessment/Plan: 68-year-old female with a displaced trimalleolar ankle fracture dislocation left side who is treated initially with manipulative close reduction compressive splinting.  She will need open reduction internal fixation of her left ankle fracture.  The patient also has a closed proximal fibula fracture on the right side.  This will need no surgical treatment and we will use a knee immobilizer for comfort.  She'll be taken operating room around 12:30 tomorrow for open  reduction internal fixation of her ankle fracture.  The patient should be n.p.o. After midnight.  She will need medical clearance. I have had a prolonged discussion with the patient regarding the risk and benefits of the surgical procedure.  The patient understands the risks include but are not limited to bleeding, infection and failure of the surgery to cure the problem and need for further surgery.  The patient understands there is a slight risk of death at the time of surgery.  The patient understands these risks along with the potential benefits and wishes   to proceed with surgical intervention.  The patient will be followed in the office in the postoperative period.  GRAVES,JOHN L 10/01/2014, 10:16 PM

## 2014-10-01 NOTE — ED Notes (Signed)
Patient reports that she was seen in the ED yesterday and had a left ankle fracture. Patient states she continues to have pain and swelling and was told she could not have surgery until resolved. Patient is coming into day due to pain in the right ankle which started after her fall yesterday.

## 2014-10-02 ENCOUNTER — Encounter (HOSPITAL_COMMUNITY): Payer: Self-pay | Admitting: Anesthesiology

## 2014-10-02 ENCOUNTER — Inpatient Hospital Stay: Admission: EM | Admit: 2014-10-02 | Payer: Self-pay | Admitting: Orthopedic Surgery

## 2014-10-02 ENCOUNTER — Encounter (HOSPITAL_COMMUNITY): Payer: Self-pay | Admitting: Internal Medicine

## 2014-10-02 ENCOUNTER — Inpatient Hospital Stay (HOSPITAL_COMMUNITY): Payer: Medicare Other

## 2014-10-02 ENCOUNTER — Inpatient Hospital Stay (HOSPITAL_COMMUNITY): Payer: Medicare Other | Admitting: Anesthesiology

## 2014-10-02 ENCOUNTER — Encounter (HOSPITAL_COMMUNITY)
Admission: EM | Disposition: A | Discharge status: Home / Self Care | Payer: Self-pay | Source: Home / Self Care | Attending: Orthopedic Surgery

## 2014-10-02 ENCOUNTER — Encounter (HOSPITAL_COMMUNITY)
Admission: EM | Disposition: A | Discharge status: Home / Self Care | Payer: Self-pay | Source: Home / Self Care | Attending: Internal Medicine

## 2014-10-02 ENCOUNTER — Encounter: Admission: EM | Payer: Self-pay

## 2014-10-02 DIAGNOSIS — F0390 Unspecified dementia without behavioral disturbance: Secondary | ICD-10-CM

## 2014-10-02 DIAGNOSIS — S82892D Other fracture of left lower leg, subsequent encounter for closed fracture with routine healing: Secondary | ICD-10-CM

## 2014-10-02 DIAGNOSIS — C3490 Malignant neoplasm of unspecified part of unspecified bronchus or lung: Secondary | ICD-10-CM

## 2014-10-02 DIAGNOSIS — S82401D Unspecified fracture of shaft of right fibula, subsequent encounter for closed fracture with routine healing: Secondary | ICD-10-CM

## 2014-10-02 DIAGNOSIS — J4520 Mild intermittent asthma, uncomplicated: Secondary | ICD-10-CM

## 2014-10-02 DIAGNOSIS — W19XXXA Unspecified fall, initial encounter: Secondary | ICD-10-CM | POA: Diagnosis present

## 2014-10-02 DIAGNOSIS — S82899A Other fracture of unspecified lower leg, initial encounter for closed fracture: Secondary | ICD-10-CM | POA: Diagnosis present

## 2014-10-02 DIAGNOSIS — W19XXXD Unspecified fall, subsequent encounter: Secondary | ICD-10-CM

## 2014-10-02 DIAGNOSIS — S82409A Unspecified fracture of shaft of unspecified fibula, initial encounter for closed fracture: Secondary | ICD-10-CM | POA: Diagnosis present

## 2014-10-02 DIAGNOSIS — J45909 Unspecified asthma, uncomplicated: Secondary | ICD-10-CM | POA: Diagnosis present

## 2014-10-02 DIAGNOSIS — N390 Urinary tract infection, site not specified: Secondary | ICD-10-CM | POA: Diagnosis present

## 2014-10-02 HISTORY — PX: ORIF ANKLE FRACTURE: SHX5408

## 2014-10-02 LAB — BASIC METABOLIC PANEL
Anion gap: 7 (ref 5–15)
BUN: 12 mg/dL (ref 6–23)
CO2: 27 mmol/L (ref 19–32)
Calcium: 8.8 mg/dL (ref 8.4–10.5)
Chloride: 105 mmol/L (ref 96–112)
Creatinine, Ser: 0.81 mg/dL (ref 0.50–1.10)
GFR calc Af Amer: 84 mL/min — ABNORMAL LOW (ref >90)
GFR calc non Af Amer: 72 mL/min — ABNORMAL LOW (ref >90)
Glucose, Bld: 98 mg/dL (ref 70–99)
Potassium: 3.3 mmol/L — ABNORMAL LOW (ref 3.5–5.1)
Sodium: 139 mmol/L (ref 135–145)

## 2014-10-02 LAB — CBC
HCT: 37 % (ref 36.0–46.0)
Hemoglobin: 11.7 g/dL — ABNORMAL LOW (ref 12.0–15.0)
MCH: 29.2 pg (ref 26.0–34.0)
MCHC: 31.6 g/dL (ref 30.0–36.0)
MCV: 92.3 fL (ref 78.0–100.0)
Platelets: 174 10*3/uL (ref 150–400)
RBC: 4.01 MIL/uL (ref 3.87–5.11)
RDW: 14 % (ref 11.5–15.5)
WBC: 5.4 10*3/uL (ref 4.0–10.5)

## 2014-10-02 LAB — URINALYSIS, ROUTINE W REFLEX MICROSCOPIC
Bilirubin Urine: NEGATIVE
Glucose, UA: NEGATIVE mg/dL
Ketones, ur: NEGATIVE mg/dL
Nitrite: POSITIVE — AB
Protein, ur: NEGATIVE mg/dL
Specific Gravity, Urine: 1.019 (ref 1.005–1.030)
Urobilinogen, UA: 0.2 mg/dL (ref 0.0–1.0)
pH: 6 (ref 5.0–8.0)

## 2014-10-02 LAB — TYPE AND SCREEN
ABO/RH(D): A NEG
Antibody Screen: NEGATIVE

## 2014-10-02 LAB — ABO/RH: ABO/RH(D): A NEG

## 2014-10-02 LAB — URINE MICROSCOPIC-ADD ON

## 2014-10-02 LAB — SURGICAL PCR SCREEN
MRSA, PCR: NEGATIVE
Staphylococcus aureus: POSITIVE — AB

## 2014-10-02 SURGERY — OPEN REDUCTION INTERNAL FIXATION (ORIF) ANKLE FRACTURE
Anesthesia: General

## 2014-10-02 SURGERY — OPEN REDUCTION INTERNAL FIXATION (ORIF) ANKLE FRACTURE
Anesthesia: General | Laterality: Left

## 2014-10-02 SURGERY — OPEN REDUCTION INTERNAL FIXATION (ORIF) ANKLE FRACTURE
Anesthesia: General | Site: Ankle | Laterality: Left

## 2014-10-02 MED ORDER — DOCUSATE SODIUM 100 MG PO CAPS
100.0000 mg | ORAL_CAPSULE | Freq: Two times a day (BID) | ORAL | Status: DC
  Administered 2014-10-02 – 2014-10-05 (×7): 100 mg via ORAL
  Filled 2014-10-02 (×9): qty 1

## 2014-10-02 MED ORDER — PHENYLEPHRINE 40 MCG/ML (10ML) SYRINGE FOR IV PUSH (FOR BLOOD PRESSURE SUPPORT)
PREFILLED_SYRINGE | INTRAVENOUS | Status: AC
  Filled 2014-10-02: qty 10

## 2014-10-02 MED ORDER — DEXTROSE 5 % IV SOLN
500.0000 mg | Freq: Four times a day (QID) | INTRAVENOUS | Status: DC | PRN
  Administered 2014-10-02: 500 mg via INTRAVENOUS
  Filled 2014-10-02 (×4): qty 5

## 2014-10-02 MED ORDER — FENTANYL CITRATE 0.05 MG/ML IJ SOLN
25.0000 ug | INTRAMUSCULAR | Status: DC | PRN
  Administered 2014-10-02 (×2): 50 ug via INTRAVENOUS

## 2014-10-02 MED ORDER — HYDROMORPHONE HCL 1 MG/ML IJ SOLN
INTRAMUSCULAR | Status: AC
  Filled 2014-10-02: qty 1

## 2014-10-02 MED ORDER — ONDANSETRON HCL 4 MG/2ML IJ SOLN
INTRAMUSCULAR | Status: AC
  Filled 2014-10-02: qty 2

## 2014-10-02 MED ORDER — DEXTROSE 5 % IV SOLN
INTRAVENOUS | Status: AC
  Filled 2014-10-02: qty 2

## 2014-10-02 MED ORDER — LACTATED RINGERS IV SOLN
INTRAVENOUS | Status: DC | PRN
  Administered 2014-10-02: 12:00:00 via INTRAVENOUS

## 2014-10-02 MED ORDER — HYDROCODONE-ACETAMINOPHEN 5-325 MG PO TABS
1.0000 | ORAL_TABLET | Freq: Four times a day (QID) | ORAL | Status: DC | PRN
  Administered 2014-10-02 – 2014-10-03 (×4): 2 via ORAL
  Administered 2014-10-04 (×2): 1 via ORAL
  Administered 2014-10-05 (×3): 2 via ORAL
  Filled 2014-10-02 (×4): qty 2
  Filled 2014-10-02: qty 1
  Filled 2014-10-02 (×2): qty 2
  Filled 2014-10-02: qty 1
  Filled 2014-10-02: qty 2

## 2014-10-02 MED ORDER — FENTANYL CITRATE 0.05 MG/ML IJ SOLN
INTRAMUSCULAR | Status: AC
  Filled 2014-10-02: qty 5

## 2014-10-02 MED ORDER — BUPIVACAINE HCL (PF) 0.5 % IJ SOLN
INTRAMUSCULAR | Status: DC | PRN
  Administered 2014-10-02: 20 mL

## 2014-10-02 MED ORDER — ALBUTEROL SULFATE (2.5 MG/3ML) 0.083% IN NEBU
2.5000 mg | INHALATION_SOLUTION | RESPIRATORY_TRACT | Status: DC | PRN
  Administered 2014-10-02: 2.5 mg via RESPIRATORY_TRACT
  Filled 2014-10-02: qty 3

## 2014-10-02 MED ORDER — GLYCOPYRROLATE 0.2 MG/ML IJ SOLN
INTRAMUSCULAR | Status: AC
  Filled 2014-10-02: qty 2

## 2014-10-02 MED ORDER — NEOSTIGMINE METHYLSULFATE 10 MG/10ML IV SOLN
INTRAVENOUS | Status: AC
  Filled 2014-10-02: qty 1

## 2014-10-02 MED ORDER — GLYCOPYRROLATE 0.2 MG/ML IJ SOLN
INTRAMUSCULAR | Status: DC | PRN
  Administered 2014-10-02: 0.1 mg via INTRAVENOUS

## 2014-10-02 MED ORDER — MORPHINE SULFATE 2 MG/ML IJ SOLN
0.5000 mg | INTRAMUSCULAR | Status: DC | PRN
  Administered 2014-10-02 – 2014-10-04 (×6): 0.5 mg via INTRAVENOUS
  Filled 2014-10-02 (×6): qty 1

## 2014-10-02 MED ORDER — CHLORHEXIDINE GLUCONATE CLOTH 2 % EX PADS
6.0000 | MEDICATED_PAD | Freq: Every day | CUTANEOUS | Status: DC
Start: 2014-10-02 — End: 2014-10-05
  Administered 2014-10-02 – 2014-10-05 (×4): 6 via TOPICAL

## 2014-10-02 MED ORDER — LIDOCAINE HCL (CARDIAC) 20 MG/ML IV SOLN
INTRAVENOUS | Status: DC | PRN
  Administered 2014-10-02: 50 mg via INTRAVENOUS

## 2014-10-02 MED ORDER — BUPIVACAINE HCL (PF) 0.5 % IJ SOLN
INTRAMUSCULAR | Status: AC
  Filled 2014-10-02: qty 30

## 2014-10-02 MED ORDER — FENTANYL CITRATE 0.05 MG/ML IJ SOLN
INTRAMUSCULAR | Status: DC | PRN
  Administered 2014-10-02: 50 ug via INTRAVENOUS
  Administered 2014-10-02 (×4): 25 ug via INTRAVENOUS

## 2014-10-02 MED ORDER — FENTANYL CITRATE 0.05 MG/ML IJ SOLN
INTRAMUSCULAR | Status: AC
  Filled 2014-10-02: qty 2

## 2014-10-02 MED ORDER — PROMETHAZINE HCL 25 MG/ML IJ SOLN: 6.2500 mg | Freq: Four times a day (QID) | INTRAMUSCULAR | Status: DC | PRN

## 2014-10-02 MED ORDER — METHOCARBAMOL 500 MG PO TABS
500.0000 mg | ORAL_TABLET | Freq: Four times a day (QID) | ORAL | Status: DC | PRN
  Administered 2014-10-02 – 2014-10-04 (×3): 500 mg via ORAL
  Filled 2014-10-02 (×4): qty 1

## 2014-10-02 MED ORDER — ENOXAPARIN SODIUM 40 MG/0.4ML ~~LOC~~ SOLN
40.0000 mg | Freq: Every day | SUBCUTANEOUS | Status: DC
  Administered 2014-10-02 – 2014-10-04 (×3): 40 mg via SUBCUTANEOUS
  Filled 2014-10-02 (×4): qty 0.4

## 2014-10-02 MED ORDER — LIP MEDEX EX OINT
TOPICAL_OINTMENT | CUTANEOUS | Status: AC
  Administered 2014-10-02: 15:00:00
  Filled 2014-10-02: qty 7

## 2014-10-02 MED ORDER — DEXTROSE-NACL 5-0.9 % IV SOLN
INTRAVENOUS | Status: DC
  Administered 2014-10-02 – 2014-10-04 (×6): via INTRAVENOUS

## 2014-10-02 MED ORDER — MUPIROCIN 2 % EX OINT
1.0000 | TOPICAL_OINTMENT | Freq: Two times a day (BID) | CUTANEOUS | Status: DC
Start: 2014-10-02 — End: 2014-10-05
  Administered 2014-10-02 – 2014-10-05 (×7): 1 via NASAL
  Filled 2014-10-02 (×3): qty 22

## 2014-10-02 MED ORDER — HYDROCODONE-ACETAMINOPHEN 5-325 MG PO TABS: 1.0000 | ORAL_TABLET | Freq: Four times a day (QID) | ORAL | Status: DC | PRN

## 2014-10-02 MED ORDER — CEFAZOLIN SODIUM 1-5 GM-% IV SOLN
1.0000 g | Freq: Three times a day (TID) | INTRAVENOUS | Status: AC
  Administered 2014-10-02 – 2014-10-03 (×3): 1 g via INTRAVENOUS
  Filled 2014-10-02 (×4): qty 50

## 2014-10-02 MED ORDER — 0.9 % SODIUM CHLORIDE (POUR BTL) OPTIME
TOPICAL | Status: DC | PRN
  Administered 2014-10-02: 1000 mL

## 2014-10-02 MED ORDER — DEXTROSE 5 % AND 0.9 % NACL IV BOLUS
1000.0000 mL | Freq: Once | INTRAVENOUS | Status: AC
  Administered 2014-10-02: 1000 mL via INTRAVENOUS

## 2014-10-02 MED ORDER — DEXTROSE 5 % IV SOLN
1.0000 g | INTRAVENOUS | Status: AC
  Administered 2014-10-02: 2 g via INTRAVENOUS
  Administered 2014-10-02 – 2014-10-05 (×4): 1 g via INTRAVENOUS
  Filled 2014-10-02 (×4): qty 10

## 2014-10-02 MED ORDER — PHENYLEPHRINE HCL 10 MG/ML IJ SOLN
INTRAMUSCULAR | Status: DC | PRN
  Administered 2014-10-02 (×4): 80 ug via INTRAVENOUS

## 2014-10-02 MED ORDER — ONDANSETRON HCL 4 MG/2ML IJ SOLN: 4.0000 mg | Freq: Once | INTRAMUSCULAR | Status: DC | PRN

## 2014-10-02 MED ORDER — ONDANSETRON HCL 4 MG/2ML IJ SOLN
INTRAMUSCULAR | Status: DC | PRN
  Administered 2014-10-02: 4 mg via INTRAVENOUS

## 2014-10-02 MED ORDER — PROPOFOL 10 MG/ML IV BOLUS
INTRAVENOUS | Status: DC | PRN
  Administered 2014-10-02: 130 mg via INTRAVENOUS

## 2014-10-02 MED ORDER — PROPOFOL 10 MG/ML IV BOLUS
INTRAVENOUS | Status: AC
  Filled 2014-10-02: qty 20

## 2014-10-02 MED ORDER — DONEPEZIL HCL 10 MG PO TABS
10.0000 mg | ORAL_TABLET | Freq: Every day | ORAL | Status: DC
  Administered 2014-10-02 – 2014-10-04 (×4): 10 mg via ORAL
  Filled 2014-10-02 (×5): qty 1

## 2014-10-02 SURGICAL SUPPLY — 49 items
BAG SPEC THK2 15X12 ZIP CLS (MISCELLANEOUS) ×1
BAG ZIPLOCK 12X15 (MISCELLANEOUS) ×2 IMPLANT
BANDAGE ELASTIC 4 VELCRO ST LF (GAUZE/BANDAGES/DRESSINGS) ×1 IMPLANT
BANDAGE ELASTIC 6 VELCRO ST LF (GAUZE/BANDAGES/DRESSINGS) ×1 IMPLANT
BIT DRILL 2.5X2.75 QC CALB (BIT) ×1 IMPLANT
COVER SURGICAL LIGHT HANDLE (MISCELLANEOUS) ×1 IMPLANT
CUFF TOURN SGL QUICK 34 (TOURNIQUET CUFF) ×2
CUFF TRNQT CYL 34X4X40X1 (TOURNIQUET CUFF) ×1 IMPLANT
DECANTER SPIKE VIAL GLASS SM (MISCELLANEOUS) ×2 IMPLANT
DRAPE C-ARM 42X120 X-RAY (DRAPES) ×2 IMPLANT
DRAPE U-SHAPE 47X51 STRL (DRAPES) ×2 IMPLANT
DRSG ADAPTIC 3X8 NADH LF (GAUZE/BANDAGES/DRESSINGS) ×2 IMPLANT
DRSG PAD ABDOMINAL 8X10 ST (GAUZE/BANDAGES/DRESSINGS) ×2 IMPLANT
ELECT REM PT RETURN 9FT ADLT (ELECTROSURGICAL) ×2
ELECTRODE REM PT RTRN 9FT ADLT (ELECTROSURGICAL) ×1 IMPLANT
GAUZE SPONGE 4X4 12PLY STRL (GAUZE/BANDAGES/DRESSINGS) ×2 IMPLANT
GAUZE XEROFORM 5X9 LF (GAUZE/BANDAGES/DRESSINGS) ×1 IMPLANT
GLOVE BIO SURGEON STRL SZ8 (GLOVE) ×2 IMPLANT
GLOVE ECLIPSE 7.5 STRL STRAW (GLOVE) ×2 IMPLANT
K-WIRE ACE 1.6X6 (WIRE) ×4
KWIRE ACE 1.6X6 (WIRE) IMPLANT
MANIFOLD NEPTUNE II (INSTRUMENTS) ×2 IMPLANT
NEEDLE HYPO 22GX1.5 SAFETY (NEEDLE) ×2 IMPLANT
PACK TOTAL JOINT (CUSTOM PROCEDURE TRAY) ×2 IMPLANT
PAD ABD 8X10 STRL (GAUZE/BANDAGES/DRESSINGS) ×1 IMPLANT
PAD CAST 4YDX4 CTTN HI CHSV (CAST SUPPLIES) ×2 IMPLANT
PADDING CAST COTTON 4X4 STRL (CAST SUPPLIES) ×4
PADDING CAST COTTON 6X4 STRL (CAST SUPPLIES) ×2 IMPLANT
PADDING CAST SYN 6 (CAST SUPPLIES) ×1
PADDING CAST SYNTHETIC 4 (CAST SUPPLIES) ×1
PADDING CAST SYNTHETIC 4X4 STR (CAST SUPPLIES) IMPLANT
PADDING CAST SYNTHETIC 6X4 NS (CAST SUPPLIES) IMPLANT
PLATE ACE 100DEG 8HOLE (Plate) ×1 IMPLANT
POSITIONER SURGICAL ARM (MISCELLANEOUS) ×2 IMPLANT
SCREW ACE CAN 4.0 40M (Screw) ×1 IMPLANT
SCREW ACE CAN 4.0 50M (Screw) ×1 IMPLANT
SCREW CORTICAL 3.5MM  12MM (Screw) ×3 IMPLANT
SCREW CORTICAL 3.5MM 12MM (Screw) IMPLANT
SCREW NLOCK CANC HEX 4X16 (Screw) ×1 IMPLANT
SCREW NLOCK CANC HEX 4X18 (Screw) ×4 IMPLANT
SCREW NLOCK CANC HEX 4X18 FIB (Screw) IMPLANT
SPLINT FIBERGLASS 4X30 (CAST SUPPLIES) ×2 IMPLANT
SUT ETHILON 3 0 PS 1 (SUTURE) ×1 IMPLANT
SUT ETHILON 4 0 PS 2 18 (SUTURE) ×4 IMPLANT
SUT FIBERWIRE 2-0 18 17.9 3/8 (SUTURE) ×2
SUT VIC AB 2-0 CT1 27 (SUTURE) ×2
SUT VIC AB 2-0 CT1 TAPERPNT 27 (SUTURE) ×1 IMPLANT
SUTURE FIBERWR 2-0 18 17.9 3/8 (SUTURE) IMPLANT
SYR CONTROL 10ML LL (SYRINGE) ×2 IMPLANT

## 2014-10-02 NOTE — Brief Op Note (Signed)
10/01/2014 - 10/02/2014  1:20 PM  PATIENT:  Jearld Adjutant  69 y.o. female  PRE-OPERATIVE DIAGNOSIS:  left ankle fracture  POST-OPERATIVE DIAGNOSIS:  left ankle fracture  PROCEDURE:  Procedure(s): OPEN REDUCTION INTERNAL FIXATION (ORIF) ANKLE FRACTURE (Left)  SURGEON:  Surgeon(s) and Role:    * Alta Corning, MD - Primary  PHYSICIAN ASSISTANT:   ASSISTANTS: bethune   ANESTHESIA:   general  EBL:  Total I/O In: 500 [I.V.:500] Out: 10 [Blood:10]  BLOOD ADMINISTERED:none  DRAINS: none   LOCAL MEDICATIONS USED:  MARCAINE     SPECIMEN:  No Specimen  DISPOSITION OF SPECIMEN:  N/A  COUNTS:  YES  TOURNIQUET:  * Missing tourniquet times found for documented tourniquets in log:  [id number] *  DICTATION: .Other Dictation: Dictation Number [id number]  PLAN OF CARE: Admit to inpatient   PATIENT DISPOSITION:  PACU - hemodynamically stable.   Delay start of Pharmacological VTE agent (>24hrs) due to surgical blood loss or risk of bleeding: no

## 2014-10-02 NOTE — Anesthesia Postprocedure Evaluation (Signed)
  Anesthesia Post-op Note  Patient: Valerie [name]  Procedure(s) Performed: Procedure(s) (LRB): OPEN REDUCTION INTERNAL FIXATION (ORIF) ANKLE FRACTURE (Left)  Patient Location: PACU  Anesthesia Type: General  Level of Consciousness: awake and alert   Airway and Oxygen Therapy: Patient Spontanous Breathing  Post-op Pain: mild  Post-op Assessment: Post-op Vital signs reviewed, Patient's Cardiovascular Status Stable, Respiratory Function Stable, Patent Airway and No signs of Nausea or vomiting  Last Vitals:  Filed Vitals:   10/02/14 1405  BP:   Pulse:   Temp: 36.6 C  Resp:     Post-op Vital Signs: stable   Complications: No apparent anesthesia complications

## 2014-10-02 NOTE — Anesthesia Preprocedure Evaluation (Addendum)
Anesthesia Evaluation  Patient identified by MRN, date of birth, ID band Patient awake  General Assessment Comment: Heart murmur  . Shortness of breath    hx lung ca  . Arthritis  . Lung cancer dx'd 2002 . Metastasis to brain dx'd 2008 . Metastasis to lymph nodes dx'd 09/2011 . Anal fistula  . H/O osteoporosis  . H/O vitamin D deficiency  . History of measles, mumps, or rubella  . H/O varicella  . Hypertension  . Yeast infection  . Abnormal Pap smear 2006 . Atrophic vaginitis 2008 . Dyspareunia 2008 . Dementia 2009 . Osteoporosis 2010 . Status post chemotherapy 2003   CARBOPLATIN/PACLITAXEL /STATUS POST CLINICAL TRAIL OF CELEBREX AND IRESSA AT [facility] FOR 1 YEAR . Status post radiation therapy 2003   LEFT LUNG . Status post radiation therapy 11/07/2005   WHOLE BRAIN: DR Larkin [facility] . Status post radiation therapy 06/02/2008   GAMMA KNIFE OF RESECTED CAVITAY . On antineoplastic chemotherapy    TARCEVA . Nodule of right lung CT- 06/03/12   RIGHT UPPER LOBE . Thyroid adenoma    ? [name] Thyroid cancer 10/18/11 bx   adenoid nodules  . Anxiety  . Cataract  . Nodule of right lung 06/03/12   Upper Lobe . Lung metastasis    PET scan 05/05/13, RUL lung nodule . History of radiation therapy 07/28/13- 08/10/13   right lung metastasis 5000 cGy 10 sessions      Reviewed: Allergy & Precautions, NPO status , Patient's Chart, lab work & pertinent test results  History of Anesthesia Complications Negative for: history of anesthetic complications  Airway Mallampati: III  TM Distance: >3 FB Neck ROM: Full   Comment: Recessed chin, hx of radical neck dissection, no history of radiation to neck Dental no notable dental hx. (+) Dental Advisory Given, Poor Dentition   Pulmonary  Hx of lung cancer s/p chemo/radiation/  lobectomy, metastasis to brain s/p craniotomy, thyroid cancer s/p radical neck dissection breath sounds clear to auscultation  Pulmonary exam normal       Cardiovascular hypertension, Rhythm:Regular Rate:Normal     Neuro/Psych dementia negative psych ROS   GI/Hepatic negative GI ROS, Neg liver ROS,   Endo/Other  negative endocrine ROS  Renal/GU negative Renal ROS   UTI, been on several days of antibiotics (rocephin), afebrile and white count 5.4 negative genitourinary   Musculoskeletal  (+) Arthritis -, Osteoarthritis,    Abdominal   Peds negative pediatric ROS (+)  Hematology negative hematology ROS (+)   Anesthesia Other Findings   Reproductive/Obstetrics negative OB ROS                            Anesthesia Physical Anesthesia Plan  ASA: III  Anesthesia Plan: General   Post-op Pain Management:    Induction: Intravenous  Airway Management Planned: LMA  Additional Equipment:   Intra-op Plan:   Post-operative Plan: Extubation in OR  Informed Consent: I have reviewed the patients History and Physical, chart, labs and discussed the procedure including the risks, benefits and alternatives for the proposed anesthesia with the patient or authorized representative who has indicated his/her understanding and acceptance.   Dental advisory given  Plan Discussed with: CRNA  Anesthesia Plan Comments: (Discussed and offered peripheral nerve block to patient but she refused)       Anesthesia Quick Evaluation

## 2014-10-02 NOTE — Clinical Social Work Psychosocial (Signed)
Clinical Social Work Department BRIEF PSYCHOSOCIAL ASSESSMENT 10/02/2014  Patient:  Valerie [name], Valerie [name]     Account Number:  [id number]     Admit date:  10/01/2014  Clinical Social Worker:  Dede Query, CLINICAL SOCIAL WORKER  Date/Time:  10/02/2014 03:10 PM  Referred by:  Physician  Date Referred:  10/02/2014 Referred for  SNF Placement   Other Referral:   Interview type:  Family Other interview type:   Pt was in surgery so CSW met with pt's husband at bedside    PSYCHOSOCIAL DATA Living Status:  HUSBAND Admitted from facility:   Level of care:   Primary support name:  [name] Primary support relationship to patient:  SPOUSE Degree of support available:   High    CURRENT CONCERNS  Other Concerns:    SOCIAL WORK ASSESSMENT / PLAN CSW met with pt's husband at bedside to discuss services. CSW explained role of CSW and provided information about SNF/rehab facilities.  CSW encouraged pt's husband to discuss history and need for services.  CSW provided pt's husband with list of facilities that may accommodate pt for her rehab needs. CSW provided active listening for pt's husband   Assessment/plan status:  Psychosocial Support/Ongoing Assessment of Needs Other assessment/ plan:   Information/referral to community resources:   Fax pt information out to SNF facilities    PATIENT'S/FAMILY'S RESPONSE TO PLAN OF CARE: Pt was in surgery and pt's husband was in her room at bedside.  Pt's husband stated that he thought pt was in recovery and he was waiting for her at her bedside.  Pt's husband showed CSW a picture of the rod that was going to be placed in pt's leg today.  Pt stated that it is only he and pt living in the home and he "definately needed a SNF bed for pt at discharge.  pt's husband stated that he wanted pt information sent out to SNF's in [facility] and that he "wanted a good one".  Pt's husband hopeful that pt will be able to get her strength back at a SNF and then  return back home with him.   Dede Query, LCSW [place] Worker - Weekend Coverage cell #: [phone]

## 2014-10-02 NOTE — Transfer of Care (Signed)
Immediate Anesthesia Transfer of Care Note  Patient: Valerie [name]  Procedure(s) Performed: Procedure(s): OPEN REDUCTION INTERNAL FIXATION (ORIF) ANKLE FRACTURE (Left)  Patient Location: PACU  Anesthesia Type:General  Level of Consciousness: awake, alert  and oriented  Airway & Oxygen Therapy: Patient Spontanous Breathing and Patient connected to face mask oxygen  Post-op Assessment: Report given to RN  Post vital signs: Reviewed and stable  Last Vitals:  Filed Vitals:   10/02/14 1124  BP: 116/75  Pulse: 74  Temp:   Resp:     Complications: No apparent anesthesia complications

## 2014-10-02 NOTE — Progress Notes (Signed)
Progress Note   Valerie [name] PYK:[id number] DOB: [birth year] DOA: 10/01/2014 PCP: [name], MD   Brief Narrative:   Valerie [name] is an 69 y.o. female with a PMH of lung cancer with brain metastasis, dementia, hypertension and thyroid cancer who was admitted 10/01/14 after suffering from a mechanical fall resulting in a left trimalleolar ankle fracture/dislocation as well as proximal right fibular fracture with inability to bear weight. Patient has been seen and evaluated by orthopedics with plan for an open reduction internal fixation of her left ankle fracture  Assessment/Plan:   Principal Problem:   Fall resulting in a left trimalleolar fracture and proximal right fibular fracture with inability to bear weight  ORIF of left ankle fracture scheduled for today. No surgical treatment recommended for right closed proximal fibular fracture. Continue knee immobilizer for comfort.  Will need physical therapy postoperatively as well as likely SNF placement.  Active Problems:   Lung cancer  Has been on an experimental study medication [phone] through [facility].  Admitting physician notified them of the patient's admission, and this medication is currently being administered by her family.    Dementia  Continue Aricept.    UTI (lower urinary tract infection)  Admission UA showed nitrites with moderate leukocytes. Microscopy with many bacteria.  Continue empiric Rocephin for now.  Follow-up urine culture.    Reactive airway disease  Albuterol ordered as needed.    DVT Prophylaxis  Start Lovenox.  Code Status: Full. Family Communication: Multiple family updated at bedside. Disposition Plan: Home versus SNF when stable.   IV Access:    Peripheral IV   Procedures and diagnostic studies:   Dg Ankle Complete Right 10/01/2014: No acute fracture or dislocation identified about the right ankle.     Dg Knee Complete 4 Views Right 10/01/2014:  Nondisplaced proximal fibular fracture.     Dg Foot Complete Right 10/01/2014: Negative.     Medical Consultants:    None.  Anti-Infectives:    Rocephin 10/01/14--->  Subjective:    [name] is post-op, feeling okay except for some chills (no fever).  No nausea.  Sore.    Objective:    Filed Vitals:   10/02/14 0323 10/02/14 0400 10/02/14 0649 10/02/14 0800  BP:   69/47   Pulse:   80   Temp:   97.5 F (36.4 C)   TempSrc:   Oral   Resp:  16 18 20   Height:      Weight:      SpO2: 97% 93% 99% 100%    Intake/Output Summary (Last 24 hours) at 10/02/14 0934 Last data filed at 10/02/14 0500  Gross per 24 hour  Intake      0 ml  Output      0 ml  Net      0 ml    Exam: Gen:  NAD Cardiovascular:  RRR, No M/R/G Respiratory:  Lungs CTAB Gastrointestinal:  Abdomen soft, NT/ND, + BS Extremities:  Has extensive LLE soft cast in place.   Data Reviewed:    Labs: Basic Metabolic Panel:  Recent Labs Lab 10/01/14 1918 10/02/14 0315  NA 138 139  K 3.7 3.3*  CL 105 105  CO2 23 27  GLUCOSE 168* 98  BUN 11 12  CREATININE 0.89 0.81  CALCIUM 9.1 8.8   GFR Estimated Creatinine Clearance: 78.3 mL/min (by C-G formula based on Cr of 0.81).  CBC:  Recent Labs Lab 10/01/14 1918 10/02/14 0315  WBC 9.3 5.4  NEUTROABS  8.1*  --   HGB 12.5 11.7*  HCT 39.2 37.0  MCV 92.7 92.3  PLT 181 174   Microbiology No results found for this or any previous visit (from the past 240 hour(s)).   Medications:   . cefTRIAXone (ROCEPHIN) IVPB 1 gram/50 mL D5W  1 g Intravenous Q24H  . docusate sodium  100 mg Oral BID  . donepezil  10 mg Oral QHS   Continuous Infusions: . dextrose 5 % and 0.9% NaCl 75 mL/hr at 10/02/14 8453    Time spent: 35 minutes with > 50% of time discussing current diagnostic test results, clinical impression and plan of care.    LOS: 0 days   RAMA,CHRISTINA  [facility] Pager [phone]. If unable to reach me by pager, please call my  cell phone at [phone].  *Please refer to [email or web address], password [email or web address] to get updated schedule on who will round on this patient, as hospitalists switch teams weekly. If 7PM-7AM, please contact night-coverage at [email or web address], password [email or web address] for any overnight needs.  10/02/2014, 9:34 AM

## 2014-10-02 NOTE — Clinical Social Work Placement (Signed)
Clinical Social Work Department CLINICAL SOCIAL WORK PLACEMENT NOTE 10/02/2014  Patient:  AQUILA, MENZIE  Account Number:  [id number] Admit date:  10/01/2014  Clinical Social Worker:  Dede Query, CLINICAL SOCIAL WORKER  Date/time:  10/02/2014 03:19 PM  Clinical Social Work is seeking post-discharge placement for this patient at the following level of care:   SKILLED NURSING   (*CSW will update this form in Epic as items are completed)   10/02/2014  Patient/family provided with [place] Department of Clinical Social Work's list of facilities offering this level of care within the geographic area requested by the patient (or if unable, by the patient's family).  10/02/2014  Patient/family informed of their freedom to choose among providers that offer the needed level of care, that participate in Medicare, Medicaid or managed care program needed by the patient, have an available bed and are willing to accept the patient.  10/02/2014  Patient/family informed of [facility]' ownership interest in [facility], as well as of the fact that they are under no obligation to receive care at this facility.  PASARR submitted to EDS on  PASARR number received on   FL2 transmitted to all facilities in geographic area requested by pt/family on  10/02/2014 FL2 transmitted to all facilities within larger geographic area on   Patient informed that his/her managed care company has contracts with or will negotiate with  certain facilities, including the following:     Patient/family informed of bed offers received:   Patient chooses bed at  Physician recommends and patient chooses bed at    Patient to be transferred to  on   Patient to be transferred to facility by  Patient and family notified of transfer on  Name of family member notified:    The following physician request were entered in Epic:    Additional Comments:   .Dede Query, [place] Social Worker - Weekend Coverage cell #: [phone]

## 2014-10-02 NOTE — H&P (Signed)
PCP:  [name], MD  Oncology [facility]  Chief Complaint:  Cannot walk  HPI: Valerie [name] is a 69 y.o. female   has a past medical history of Heart murmur; Shortness of breath; Arthritis; Lung cancer (dx'd 2002); Metastasis to brain (dx'd 2008); Metastasis to lymph nodes (dx'd 09/2011); Anal fistula; H/O osteoporosis; H/O vitamin D deficiency; History of measles, mumps, or rubella; H/O varicella; Hypertension; Yeast infection; Abnormal Pap smear (2006); Atrophic vaginitis (2008); Dyspareunia (2008); Dementia (2009); Osteoporosis (2010); Status post chemotherapy (2003); Status post radiation therapy (2003); Status post radiation therapy (11/07/2005); Status post radiation therapy (06/02/2008); On antineoplastic chemotherapy; Nodule of right lung (CT- 06/03/12); Thyroid adenoma; Thyroid cancer (10/18/11 bx); Anxiety; Cataract; Nodule of right lung (06/03/12); Lung metastasis; and History of radiation therapy (07/28/13- 08/10/13).   Presented with  On Thursday patient had a fall while trying to get up into the car states her legs just gave out because she had overexerted herself. She endorses worsening fatigue lately and feeling unwell. In emergency department UA showed evidence of UTI. Patient is afebrile given mild dementia unclear if symptomatic. Patient denies hitting her head.  Patient presented to [facility] ER and was found to have Right ankle fracture which was treated with a cast she continued to have pain on the left and was not able to tolerate any weight. She presented back to ER and was found to have right fibular fracture. Patient has known hx of Brain metastasis from lung CA followed by [facility] currently on experimental treatmet. ER spoke to orthopedics was scheduled to operate and the patient at 1 pm   Hospitalist was called for admission for left ankle fracture and right fibular fracture  Review of Systems:    Pertinent positives include:  Leg pain fatigue,   Constitutional:  No  weight loss, night sweats, Fevers, chills,weight loss  HEENT:  No headaches, Difficulty swallowing,Tooth/dental problems,Sore throat,  No sneezing, itching, ear ache, nasal congestion, post nasal drip,  Cardio-vascular:  No chest pain, Orthopnea, PND, anasarca, dizziness, palpitations.no Bilateral lower extremity swelling  GI:  No heartburn, indigestion, abdominal pain, nausea, vomiting, diarrhea, change in bowel habits, loss of appetite, melena, blood in stool, hematemesis Resp:  no shortness of breath at rest. No dyspnea on exertion, No excess mucus, no productive cough, No non-productive cough, No coughing up of blood.No change in color of mucus.No wheezing. Skin:  no rash or lesions. No jaundice GU:  no dysuria, change in color of urine, no urgency or frequency. No straining to urinate.  No flank pain.  Musculoskeletal:  No joint pain or no joint swelling. No decreased range of motion. No back pain.  Psych:  No change in mood or affect. No depression or anxiety. No memory loss.  Neuro: no localizing neurological complaints, no tingling, no weakness, no double vision, no gait abnormality, no slurred speech, no confusion  Otherwise ROS are negative except for above, 10 systems were reviewed  Past Medical History: Past Medical History  Diagnosis Date  . Heart murmur   . Shortness of breath     hx lung ca   . Arthritis   . Lung cancer dx'd 2002  . Metastasis to brain dx'd 2008  . 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 Anal fistula   . H/O osteoporosis   . H/O vitamin D deficiency   . History of measles, mumps, or rubella   . H/O varicella   . Hypertension   . Yeast infection   . Abnormal  Pap smear 2006  . Atrophic vaginitis 2008  . Dyspareunia 2008  . Dementia 2009  . Osteoporosis 2010  . Status post chemotherapy 2003    CARBOPLATIN/PACLITAXEL /STATUS POST CLINICAL TRAIL OF CELEBREX AND IRESSA AT [facility] FOR 1 YEAR  . Status post radiation therapy 2003    LEFT LUNG    . Status post radiation therapy 11/07/2005    WHOLE BRAIN: DR Larkin [facility]  . Status post radiation therapy 06/02/2008    GAMMA KNIFE OF RESECTED CAVITAY  . On antineoplastic chemotherapy     TARCEVA  . Nodule of right lung CT- 06/03/12    RIGHT UPPER LOBE  . Thyroid adenoma     ?  [name] Thyroid cancer 10/18/11 bx    adenoid nodules   . Anxiety   . Cataract   . Nodule of right lung 06/03/12    Upper Lobe  . Lung metastasis     PET scan 05/05/13, RUL lung nodule  . History of radiation therapy 07/28/13- 08/10/13    right lung metastasis 5000 cGy 10 sessions   Past Surgical History  Procedure Laterality Date  . Left occipital craniotomy  09/21/2005    tumor  . Left lower lobectomy  05/2001  . Radical neck dissection  10/18/2011    Procedure: RADICAL NECK DISSECTION;  Surgeon: Izora Gala, MD;  Location: [place];  Service: ENT;  Laterality: Right;  RIGHT MODIFIED NECK DISSECTION /POSSIBLE RIGHT THYROIDECTOM  . Thyroidectomy  10/18/2011    Procedure: THYROIDECTOMY WITH RADICAL NECK DISSECTION;  Surgeon: Izora Gala, MD;  Location: [place];  Service: ENT;  Laterality: Right;     Medications: Prior to Admission medications   Medication Sig Start Date End Date Taking? Authorizing Provider  alendronate (FOSAMAX) 70 MG tablet Take 1 tablet (70 mg total) by mouth every 7 (seven) days. Saturdays; Take with a full glass of water on an empty stomach. Patient taking differently: Take 70 mg by mouth at bedtime.  04/09/12   Eldred Manges, MD  Ascorbic Acid (VITAMIN C PO) Take 1 tablet by mouth at bedtime.     Historical Provider, MD  aspirin EC 81 MG tablet Take 81 mg by mouth at bedtime.     Historical Provider, MD  cephALEXin (KEFLEX) 500 MG capsule Take 1 capsule (500 mg total) by mouth 4 (four) times daily. 09/30/14   Fredia Sorrow, MD  Cholecalciferol (VITAMIN D3) 2000 UNITS TABS Take 2,000 Int'l Units by mouth at bedtime.     Historical Provider, MD  donepezil (ARICEPT) 10 MG tablet Take 1 tablet  (10 mg total) by mouth daily. 12/04/12   Asencion Partridge Dohmeier, MD  HYDROcodone-acetaminophen (NORCO/VICODIN) 5-325 MG per tablet Take 1-2 tablets by mouth every 6 (six) hours as needed for moderate pain. 09/30/14   Fredia Sorrow, MD  ibuprofen (ADVIL,MOTRIN) 200 MG tablet Take 400 mg by mouth every 6 (six) hours as needed for moderate pain (pain).    Historical Provider, MD  loperamide (IMODIUM) 2 MG capsule Take 2 mg by mouth 4 (four) times daily as needed for diarrhea or loose stools (diarrhea).     Historical Provider, MD  Multiple Vitamin (MULITIVITAMIN WITH MINERALS) TABS Take 1 tablet by mouth at bedtime.     Historical Provider, MD  naphazoline-pheniramine (NAPHCON-A) 0.025-0.3 % ophthalmic solution Place 1 drop into both eyes 4 (four) times daily as needed. For dry eyes    Historical Provider, MD  STUDY MEDICATION Take 3 tablets by mouth at bedtime. Lung Cancer Medication. Take  3 Tablets (300 mg) By Mouth at Bedtime.    Historical Provider, MD    Allergies:  No Known Allergies  Social History:  Ambulatory   Independently Lives at home With family     reports that she has never smoked. She has never used smokeless tobacco. She reports that she does not drink alcohol or use illicit drugs.    Family History: family history includes Cancer in her father, maternal aunt, and mother; Gait disorder in her mother.    Physical Exam: No data found.   1. General:  in No Acute distress 2. Psychological: Alert and  Oriented 3. Head/ENT:     Dry Mucous Membranes                          Head Non traumatic, neck supple                          Normal   Dentition 4. SKIN:   decreased Skin turgor,  Skin clean Dry and intact no rash 5. Heart: Regular rate and rhythm no Murmur, Rub or gallop 6. Lungs: occasional wheezes or crackles   7. Abdomen: Soft, non-tender, Non distended 8. Lower extremities: no clubbing, cyanosis, left ankle in a cast right leg in the knee splint 9. Neurologically Grossly  intact, moving all 4 extremities equally 10. MSK: Normal range of motion  body mass index is unknown because there is no weight on file.   Labs on Admission:   No results found for this or any previous visit (from the past 24 hour(s)).  UA not obtained  No results found for: HGBA1C  CrCl cannot be calculated (Unknown ideal weight.).  BNP (last 3 results) No results for input(s): PROBNP in the last 8760 hours.  Other results:  I have pearsonaly reviewed this: ECG REPORT  Rate: 90   Rhythm: NSR ST&T Change: no ischemic changes   There were no vitals filed for this visit.   Cultures:    Component Value Date/Time   SDES SPUTUM 10/20/2011 1315   SDES SPUTUM 10/20/2011 1315   SPECREQUEST NONE 10/20/2011 1315   SPECREQUEST NONE 10/20/2011 1315   CULT NORMAL OROPHARYNGEAL FLORA 10/20/2011 1315   REPTSTATUS 10/20/2011 FINAL 10/20/2011 1315   REPTSTATUS 10/22/2011 FINAL 10/20/2011 1315     Radiological Exams on Admission: Dg Ankle 2 Views Left  09/30/2014   CLINICAL DATA:  Left ankle fracture dislocation. Followup getting into a car today. Postreduction images.  EXAM: LEFT ANKLE - 2 VIEW  COMPARISON:  09/30/2014  FINDINGS: Restored tibiotalar alignment. Posterior malleolar fracture with fractures involving the medial malleolus and segmental fracture of the distal fibular metadiaphysis. Mortise width uniform. Fiberglass splint in place and obscures bony detail.  IMPRESSION: 1. Restored tibiotalar alignment. Trimalleolar fracture. Mortise distance uniform currently.   Electronically Signed   By: Van Clines M.D.   On: 09/30/2014 15:44   Dg Ankle Complete Left  09/30/2014   CLINICAL DATA:  Fall with ankle deformity.  Initial encounter  EXAM: LEFT ANKLE COMPLETE - 3+ VIEW  COMPARISON:  None.  FINDINGS: Ankle fracture dislocation:  *There is a comminuted, segmental medially and anteriorly angulated distal fibular diaphysis fracture. *Posterior malleolus fracture involving 11  mm of the posterior articular surface, with wide distraction. *Comminuted medial malleolus fracturing which continues to the central plafond. *The ankle is dislocated posteriorly.  No hindfoot fracture or malalignment.  IMPRESSION: Ankle fracture/dislocation, as described  above.   Electronically Signed   By: Monte Fantasia M.D.   On: 09/30/2014 13:56   Dg Chest Port 1 View  09/30/2014   CLINICAL DATA:  Recent ankle fracture dislocation with known history of lung carcinoma  EXAM: PORTABLE CHEST - 1 VIEW  COMPARISON:  01/20/2014  FINDINGS: Cardiac shadow is mildly enlarged but stable from the prior exam. Postsurgical changes are again seen in the left hilum. Central post radiation changes are again noted bilaterally. Elevation of left hemidiaphragm is again seen. No focal infiltrate or sizable effusion is noted.  IMPRESSION: Chronic changes related to the patient's known history of lung carcinoma. No acute abnormality is seen.   Electronically Signed   By: Inez Catalina M.D.   On: 09/30/2014 15:43    Chart has been reviewed  Assessment/Plan  69 year old female with history of lung cancer with past history of metastatic lung CA  spread to brain sp chemo, radiation and resection currently on experimental therapy by mouth at [facility]. Presents with a fall resulting in left ankle and right fibular fracture in the setting of UTI  Present on Admission:  . Lung cancer - patient is currently being followed by [facility] shipwright and left him know the patient is here she is on experimental drug unsure what that is.  . Dementia - stable we need to anticipate sundowning  . Closed left ankle fracture as per orthopedics, has known history of lung cancer followed by [facility] denies any coronary history denies any localized neurological complaints. Generalized fatigue leak explained to UTI  . Right fibular fracture - as per orthopedics.  UTI - presented with generalized fatigue await results of urine culture for now treat  with Rocephin if no evidence of growth can be discontinued Reactive airway disease component with some wheezing on lung exam. Patient states she had had this in the past. Will give albuterol as needed  Prophylaxis: SCD , Protonix  CODE STATUS:  FULL CODE   Other plan as per orders.  I have spent a total of 55 min on this admission  DOUTOVA,ANASTASSIA 10/01/2014, 10:34 PM  [facility]  Pager [phone]   after 2 AM please page floor coverage PA If 7AM-7PM, please contact the day team taking care of the patient  [email or web address]  Password [email or web address]

## 2014-10-03 LAB — URINE CULTURE: Colony Count: >=100000

## 2014-10-03 MED ORDER — POTASSIUM CHLORIDE CRYS ER 20 MEQ PO TBCR
20.0000 meq | EXTENDED_RELEASE_TABLET | Freq: Two times a day (BID) | ORAL | Status: AC
  Administered 2014-10-03 (×2): 20 meq via ORAL
  Filled 2014-10-03 (×2): qty 1

## 2014-10-03 NOTE — Progress Notes (Signed)
Subjective: 1 Day Post-Op Procedure(s) (LRB): OPEN REDUCTION INTERNAL FIXATION (ORIF) ANKLE FRACTURE (Left)  Patient seen at 2:45 PM Patient reports pain as moderate.no complaints.  Objective: Vital signs in last 24 hours: Temp:  [98.1 F (36.7 C)-98.8 F (37.1 C)] 98.1 F (36.7 C) (02/14 0643) Pulse Rate:  [84-98] 84 (02/14 0643) Resp:  [18-20] 18 (02/14 0830) BP: (98-115)/(58-72) 115/72 mmHg (02/14 0643) SpO2:  [97 %-100 %] 97 % (02/14 0830)  Intake/Output from previous day: 02/13 0701 - 02/14 0700 In: 3115 [I.V.:2765; IV Piggyback:350] Out: 975 [Urine:950; Blood:25] Intake/Output this shift: Total I/O In: 600 [I.V.:600] Out: -    Recent Labs  10/01/14 1918 10/02/14 0315  HGB 12.5 11.7*    Recent Labs  10/01/14 1918 10/02/14 0315  WBC 9.3 5.4  RBC 4.23 4.01  HCT 39.2 37.0  PLT 181 174    Recent Labs  10/01/14 1918 10/02/14 0315  NA 138 139  K 3.7 3.3*  CL 105 105  CO2 23 27  BUN 11 12  CREATININE 0.89 0.81  GLUCOSE 168* 98  CALCIUM 9.1 8.8   No results for input(s): LABPT, INR in the last 72 hours. Left lower extremity exam: Posterior/U-splint  Intact.  Moves toes actively.  Normal sensation in toes. Right knee exam: Tender over her proximal fibula.  No ligamentous instability.   Assessment/Plan: 1 Day Post-Op Procedure(s) (LRB): OPEN REDUCTION INTERNAL FIXATION (ORIF) ANKLE FRACTURE (Left)  2.  Right proxmal fibula fracture. Plan: Up with physical therapy nonweightbearing on left lower extremity.  May weight-bear as tolerated on right lowerity with knee immobilizer. Lovenox subcutaneous for DVT prophylaxis. Will need skilled nursing facility.  BETHUNE,JAMES G 10/03/2014, 3:56 PM

## 2014-10-03 NOTE — Progress Notes (Signed)
Progress Note   Valerie [name] HYQ:[id number] DOB: 1945/08/28 DOA: 10/01/2014 PCP: [name], MD   Brief Narrative:   Valerie [name] is an 69 y.o. female with a PMH of lung cancer with brain metastasis, dementia, hypertension and thyroid cancer who was admitted 10/01/14 after suffering from a mechanical fall resulting in a left trimalleolar ankle fracture/dislocation as well as proximal right fibular fracture with inability to bear weight. Patient has been seen and evaluated by orthopedics with plan for an open reduction internal fixation of her left ankle fracture  Assessment/Plan:   Principal Problem:   Fall resulting in a left trimalleolar fracture and proximal right fibular fracture with inability to bear weight  S/P ORIF of left ankle fracture 10/02/14. No surgical treatment recommended for right closed proximal fibular fracture. Continue knee immobilizer for comfort.  Will need physical therapy postoperatively as well as likely SNF placement.  Active Problems:   Hypokalemia  Replete.    Lung cancer  Has been on an experimental study medication [phone] through [facility].  Admitting physician notified them of the patient's admission, and this medication is currently being administered by her family.    Dementia  Continue Aricept.    UTI (lower urinary tract infection)  Admission UA showed nitrites with moderate leukocytes. Microscopy with many bacteria.  Continue empiric Rocephin for now.  Follow-up urine culture.    Reactive airway disease  Albuterol ordered as needed.    DVT Prophylaxis  Start Lovenox.  Code Status: Full. Family Communication: Multiple family updated at bedside 10/02/14. Disposition Plan: SNF when stable.   IV Access:    Peripheral IV   Procedures and diagnostic studies:   Dg Ankle Complete Right 10/01/2014: No acute fracture or dislocation identified about the right ankle.     Dg Knee Complete 4 Views Right  10/01/2014: Nondisplaced proximal fibular fracture.     Dg Foot Complete Right 10/01/2014: Negative.     Medical Consultants:    Dr. Dorna Leitz, Orthopedics.  Anti-Infectives:    Rocephin 10/01/14--->  Subjective:   Valerie [name] is having post-op soreness.  No nausea, vomiting, dyspnea or chest pain.    Objective:    Filed Vitals:   10/02/14 1730 10/02/14 2210 10/03/14 0212 10/03/14 0643  BP: 103/60 106/58 108/68 115/72  Pulse: 90 98 96 84  Temp:  98.8 F (37.1 C) 98.5 F (36.9 C) 98.1 F (36.7 C)  TempSrc:      Resp: 18 20 18 18   Height:      Weight:      SpO2: 100% 100% 100% 100%    Intake/Output Summary (Last 24 hours) at 10/03/14 0805 Last data filed at 10/03/14 0600  Gross per 24 hour  Intake   3115 ml  Output    975 ml  Net   2140 ml    Exam: Gen:  NAD Cardiovascular:  Tachy/reg, II/VI systolic murmur Respiratory:  Lungs CTAB Gastrointestinal:  Abdomen soft, NT/ND, + BS Extremities:  Has extensive LLE soft cast in place.   Data Reviewed:    Labs: Basic Metabolic Panel:  Recent Labs Lab 10/01/14 1918 10/02/14 0315  NA 138 139  K 3.7 3.3*  CL 105 105  CO2 23 27  GLUCOSE 168* 98  BUN 11 12  CREATININE 0.89 0.81  CALCIUM 9.1 8.8   GFR Estimated Creatinine Clearance: 78.3 mL/min (by C-G formula based on Cr of 0.81).  CBC:  Recent Labs Lab 10/01/14 1918 10/02/14 0315  WBC 9.3 5.4  NEUTROABS 8.1*  --   HGB 12.5 11.7*  HCT 39.2 37.0  MCV 92.7 92.3  PLT 181 174   Microbiology Recent Results (from the past 240 hour(s))  Surgical pcr screen     Status: Abnormal   Collection Time: 10/02/14  6:30 AM  Result Value Ref Range Status   MRSA, PCR NEGATIVE NEGATIVE Final   Staphylococcus aureus POSITIVE (A) NEGATIVE Final    Comment:        The Xpert SA Assay (FDA approved for NASAL specimens in patients over 83 years of age), is one component of a comprehensive surveillance program.  Test performance has been validated by  [facility] for patients greater than or equal to 52 year old. It is not intended to diagnose infection nor to guide or monitor treatment.      Medications:   .  ceFAZolin (ANCEF) IV  1 g Intravenous Q8H  . cefTRIAXone (ROCEPHIN) IVPB 1 gram/50 mL D5W  1 g Intravenous Q24H  . Chlorhexidine Gluconate Cloth  6 each Topical Daily  . docusate sodium  100 mg Oral BID  . donepezil  10 mg Oral QHS  . enoxaparin (LOVENOX) injection  40 mg Subcutaneous QHS  . mupirocin ointment  1 application Nasal BID   Continuous Infusions: . dextrose 5 % and 0.9% NaCl 75 mL/hr at 10/03/14 0146    Time spent: 25 minutes.    LOS: 1 day   RAMA,CHRISTINA  [facility] Pager [phone]. If unable to reach me by pager, please call my cell phone at [phone].  *Please refer to [email or web address], password [email or web address] to get updated schedule on who will round on this patient, as hospitalists switch teams weekly. If 7PM-7AM, please contact night-coverage at [email or web address], password [email or web address] for any overnight needs.  10/03/2014, 8:05 AM

## 2014-10-03 NOTE — Progress Notes (Signed)
INITIAL NUTRITION ASSESSMENT  DOCUMENTATION CODES Per approved criteria  -Not Applicable   INTERVENTION: Encouraged PO intake RD to continue to monitor  NUTRITION DIAGNOSIS: Increased nutrient needs related to healing of fractures as evidenced by estimated nutritional needs.   Goal: Pt to meet >/= 90% of their estimated nutrition needs   Monitor:  PO intake, weight, labs, I/O's  Reason for Assessment: Pt identified as at nutrition risk on the Malnutrition Screen Tool  Admitting Dx: Fall  ASSESSMENT: 69 y.o. female with a PMH of lung cancer with brain metastasis, dementia, hypertension and thyroid cancer who was admitted 10/01/14 after suffering from a mechanical fall resulting in a left trimalleolar ankle fracture/dislocation as well as proximal right fibular fracture with inability to bear weight.   Pt in room with husband at bedside. Pt received lunch tray during visit. Pt states she has little appetite but is making herself eat small frequent meals at home. Pt states that she would eat more but she does not like the food she has been provided.  Based on UBW, pt with minimal weight loss. Pt does not like ensure/Boost drinks. RD suggested supplements if she is not able to eat meals. Encouraged pt consume adequate protein to aid in healing of fracture and to maintain strength.  Nutrition focused physical exam shows no sign of depletion of muscle mass or body fat.  Labs reviewed: Low K  Height: Ht Readings from Last 1 Encounters:  10/02/14 5\' 10"  (1.778 m)    Weight: Wt Readings from Last 1 Encounters:  10/02/14 191 lb (86.637 kg)    Ideal Body Weight: 150 lb  % Ideal Body Weight: 127%  Wt Readings from Last 10 Encounters:  10/02/14 191 lb (86.637 kg)    Usual Body Weight: 193-194 lb -per pt  % Usual Body Weight: 99%  BMI:  Body mass index is 27.41 kg/(m^2).  Estimated Nutritional Needs: Kcal: 2000-2200 Protein: 110-120g Fluid: 2L/day  Skin: surgical  incision on leg  Diet Order: Diet regular  EDUCATION NEEDS: -No education needs identified at this time   Intake/Output Summary (Last 24 hours) at 10/03/14 1258 Last data filed at 10/03/14 0600  Gross per 24 hour  Intake   2615 ml  Output    975 ml  Net   1640 ml    Last BM: 2/13  Labs:   Recent Labs Lab 10/01/14 1918 10/02/14 0315  NA 138 139  K 3.7 3.3*  CL 105 105  CO2 23 27  BUN 11 12  CREATININE 0.89 0.81  CALCIUM 9.1 8.8  GLUCOSE 168* 98    CBG (last 3)  No results for input(s): GLUCAP in the last 72 hours.  Scheduled Meds: . cefTRIAXone (ROCEPHIN) IVPB 1 gram/50 mL D5W  1 g Intravenous Q24H  . Chlorhexidine Gluconate Cloth  6 each Topical Daily  . docusate sodium  100 mg Oral BID  . donepezil  10 mg Oral QHS  . enoxaparin (LOVENOX) injection  40 mg Subcutaneous QHS  . mupirocin ointment  1 application Nasal BID  . potassium chloride  20 mEq Oral BID    Continuous Infusions: . dextrose 5 % and 0.9% NaCl 75 mL/hr at 10/03/14 0146    Past Medical History  Diagnosis Date  . Lung cancer   . Lung cancer   . Ankle fracture     Past Surgical History  Procedure Laterality Date  . Lung lobectomy    . Neck surgery    . Brain surgery  Clayton Bibles, MS, RD, LDN Pager: [phone] After Hours Pager: [phone]

## 2014-10-03 NOTE — Evaluation (Signed)
Physical Therapy Evaluation Patient Details Name: Valerie [name] MRN: [id number] DOB: 1946/07/06 Today's Date: 10/03/2014   History of Present Illness  On 2/12/16y patient had a fall while trying to get up into the car. In emergency department UA showed evidence of UTI. [name] Patient presented to [facility] ER and was found to have Right trimalleolar ankle fracture which was treated with a cast.. She presented back to ER on 10/01/14 and was found to have right fibular fracture. Patient has known hx of Brain metastasis from lung CA .S/P ORIF ankle fx 10/02/14., R Fib fx treated with KI.  Clinical Impression  Patient tolerated   Mobility much better  Than expected to day after medication. Patient will benefit from PT to address problems listed in note below.    Follow Up Recommendations SNF;Supervision/Assistance - 24 hour    Equipment Recommendations    WC  Recommendations for Other Services   OT   Precautions / Restrictions Precautions Precautions: Fall Required Braces or Orthoses: Knee Immobilizer - Right Knee Immobilizer - Right: On when out of bed or walking Restrictions Weight Bearing Restrictions: Yes RLE Weight Bearing: Weight bearing as tolerated (with KI) LLE Weight Bearing: Non weight bearing      Mobility  Bed Mobility Overal bed mobility: Needs Assistance;+2 for physical assistance;+ 2 for safety/equipment Bed Mobility: Supine to Sit;Sit to Supine     Supine to sit: Total assist;+2 for physical assistance;+2 for safety/equipment;HOB elevated Sit to supine: Total assist;+2 for physical assistance;+2 for safety/equipment   General bed mobility comments: use of pillow under L ankle and  bed pad to slide   buttocks around to sitting and back into bed. both legs supported throughout.  Transfers                    Ambulation/Gait                Stairs            Wheelchair Mobility    Modified Rankin (Stroke Patients Only)       Balance Overall  balance assessment: Needs assistance;History of Falls Sitting-balance support: Bilateral upper extremity supported;Feet supported Sitting balance-Leahy Scale: Poor                                       Pertinent Vitals/Pain Pain Assessment: 0-10 Pain Score: 2  Pain Location: 2-R knee, 5 L ankle Pain Descriptors / Indicators: Aching Pain Intervention(s): Limited activity within patient's tolerance;Premedicated before session;Repositioned    Home Living Family/patient expects to be discharged to:: Skilled nursing facility Living Arrangements: Spouse/significant other                    Prior Function                 Hand Dominance        Extremity/Trunk Assessment   Upper Extremity Assessment: Generalized weakness           Lower Extremity Assessment: RLE deficits/detail;LLE deficits/detail RLE Deficits / Details: leg supported through ou in KI to lower to floor and back onto bed LLE Deficits / Details: Use of pillow tto support LLE  to edge and to floor and back onto bed.     Communication      Cognition Arousal/Alertness: Awake/alert Behavior During Therapy: WFL for tasks assessed/performed Overall Cognitive Status: Within Functional Limits for tasks assessed  General Comments      Exercises        Assessment/Plan    PT Assessment Patient needs continued PT services  PT Diagnosis Generalized weakness;Acute pain   PT Problem List Decreased strength;Decreased range of motion;Decreased activity tolerance;Decreased mobility;Decreased balance;Decreased knowledge of precautions;Decreased safety awareness;Decreased knowledge of use of DME;Pain  PT Treatment Interventions Functional mobility training;Therapeutic activities;Therapeutic exercise;Patient/family education   PT Goals (Current goals can be found in the Care Plan section) Acute Rehab PT Goals Patient Stated Goal: to  not be in pain, get back to  walking PT Goal Formulation: With patient/family Time For Goal Achievement: 10/17/14 Potential to Achieve Goals: Fair    Frequency Min 3X/week   Barriers to discharge Inaccessible home environment      Co-evaluation               End of Session   Activity Tolerance: Patient tolerated treatment well Patient left: in bed;with call bell/phone within reach;with family/visitor present Nurse Communication: Mobility status         Time: 9244-6286 PT Time Calculation (min) (ACUTE ONLY): 27 min   Charges:   PT Evaluation $Initial PT Evaluation Tier I: 1 Procedure PT Treatments $Therapeutic Activity: 8-22 mins   PT G Codes:        Claretha Cooper 10/03/2014, 4:40 PM Tresa Endo PT [phone]

## 2014-10-03 NOTE — Op Note (Signed)
Valerie [name], Valerie [name]           ACCOUNT NO.:  [id number]  MEDICAL RECORD NO.:  [id number]  LOCATION:  3329                         FACILITY:  [facility]  PHYSICIAN:  Alta Corning, M.D.   DATE OF BIRTH:  19-Aug-1946  DATE OF PROCEDURE:  10/02/2014 DATE OF DISCHARGE:                              OPERATIVE REPORT   PREOPERATIVE DIAGNOSES: 1. Trimalleolar ankle fracture dislocation. 2. Proximal fibula fracture, right.  POSTOPERATIVE DIAGNOSES: 1. Trimalleolar ankle fracture dislocation. 2. Proximal fibula fracture, right.  PROCEDURE: 1. Open reduction and internal fixation of trimalleolar ankle fracture     dislocation with fixation of the fibula as well as medial     malleolus. 2. Exam under anesthesia of right knee and closed treatment of r. prox fibula fracture.  SURGEON:  Alta Corning, M.D.  ASSISTANTS: 1. Gary Fleet, PA-C. 2. [place], OPA.  BRIEF HISTORY:  Valerie [name] is a 69 year old female with a history of significant complaints of left ankle pain after a fall.  X-ray showed a fracture  dislocation of the ankle.  She underwent a manipulative closed reduction and had reasonable result, so they put a compressive dressing on her and sent her out.  She was really struggling at home, and we made plans to see her in the office and take her to the operating room for fixation.  She on the night prior to meeting [place] in the morning was still struggling, and she came back to the emergency room.  At that time, she was noted to have a fracture in the opposite leg and after failure of conservative care, she was taken to the operating room for open reduction and fixation.  DESCRIPTION OF PROCEDURE:  The patient was taken to the operating room. After adequate anesthesia was obtained with general anesthetic, the patient was placed supine on the operating table.  The left leg was then prepped and draped in sterile fashion.  Following this, the leg was exsanguinated.  Blood pressure  and  tourniquet inflated 350 mmHg. Following this, an incision was made over the lateral ankle subcutaneous tissue down the level of the ankle and manipulative closed reduction was undertaken and length was stabilized and 8-hole plate was then used with 3 screws proximal and 3 screws distally, and we got a length control of the fracture at that time.  We then used 2-0 FiberWire, and we put a cerclage around the central portion of the fracture to help hold it in place.  Attention was turned into a radiographic imaging.  The medial side really looked like it was there and I stressed it to make sure it was not a syndesmotic injury.  There was a fracture on the medial side, but it was tough to see, and we could see on the original injury film. I felt the percutaneous fixation probably made the most sense here, and we did two 45 cannulated screws in the medial malleolus.  I took a look down and the posterior malleolus was anatomically reduced.  At this point, we irrigated the wounds, suctioned dry, and closed them in layers, and at that time turned to the right side, where we did a stress of the right knee under anesthesia, and noted  to not have any significant instability.  At this point, she was taken to recovery and she was noted to be in satisfactory condition.  Sterile compressive dressing was applied.  She was taken to recovery, and she was noted to be in satisfactory condition.  Estimated blood loss for the procedure was minimal.     Alta Corning, M.D.     Corliss Skains  D:  10/02/2014  T:  10/03/2014  Job:  [id number]

## 2014-10-04 ENCOUNTER — Inpatient Hospital Stay (HOSPITAL_COMMUNITY): Payer: Medicare Other

## 2014-10-04 LAB — CBC
HCT: 34.3 % — ABNORMAL LOW (ref 36.0–46.0)
Hemoglobin: 11 g/dL — ABNORMAL LOW (ref 12.0–15.0)
MCH: 29.4 pg (ref 26.0–34.0)
MCHC: 32.1 g/dL (ref 30.0–36.0)
MCV: 91.7 fL (ref 78.0–100.0)
Platelets: 166 10*3/uL (ref 150–400)
RBC: 3.74 MIL/uL — ABNORMAL LOW (ref 3.87–5.11)
RDW: 14 % (ref 11.5–15.5)
WBC: 7.8 10*3/uL (ref 4.0–10.5)

## 2014-10-04 LAB — BASIC METABOLIC PANEL
Anion gap: 5 (ref 5–15)
BUN: 10 mg/dL (ref 6–23)
CO2: 26 mmol/L (ref 19–32)
Calcium: 8.5 mg/dL (ref 8.4–10.5)
Chloride: 104 mmol/L (ref 96–112)
Creatinine, Ser: 0.63 mg/dL (ref 0.50–1.10)
GFR calc Af Amer: >90 mL/min (ref >90)
GFR calc non Af Amer: 89 mL/min — ABNORMAL LOW (ref >90)
Glucose, Bld: 114 mg/dL — ABNORMAL HIGH (ref 70–99)
Potassium: 4 mmol/L (ref 3.5–5.1)
Sodium: 135 mmol/L (ref 135–145)

## 2014-10-04 LAB — VITAMIN D 25 HYDROXY (VIT D DEFICIENCY, FRACTURES): Vit D, 25-Hydroxy: 48.1 ng/mL (ref 30.0–100.0)

## 2014-10-04 MED ORDER — NON FORMULARY: 300.0000 mg | Status: DC

## 2014-10-04 MED ORDER — PHENOL 1.4 % MT LIQD
1.0000 | OROMUCOSAL | Status: DC | PRN
  Administered 2014-10-04: 1 via OROMUCOSAL
  Filled 2014-10-04: qty 177

## 2014-10-04 MED ORDER — POLYETHYLENE GLYCOL 3350 17 G PO PACK
17.0000 g | PACK | Freq: Every day | ORAL | Status: DC | PRN
Start: 2014-10-04 — End: 2014-10-05

## 2014-10-04 MED ORDER — STUDY - INVESTIGATIONAL DRUG SIMPLE RECORD
300.0000 mg | Status: DC
  Administered 2014-10-04: 300 mg via ORAL

## 2014-10-04 MED ORDER — MENTHOL 3 MG MT LOZG
1.0000 | LOZENGE | OROMUCOSAL | Status: DC | PRN
  Administered 2014-10-04: 3 mg via ORAL
  Filled 2014-10-04: qty 9

## 2014-10-04 NOTE — Progress Notes (Signed)
Pt O2 sat on RA 89%. Pulse 110. Respirations unlabored and shallow. Both lower lobes diminished. IS encouraged. Pt not in distress. Notified MD. CXR ordered. Will continue to monitor.

## 2014-10-04 NOTE — Progress Notes (Signed)
PHARMACY NOTE:  INVESTIGATIONAL MEDICATION  Spoke with Dr. Lissa Morales, medical oncologist at [facility] and [place] [phone] trial.  Updated him with information on patient's status including the orthopedic surgery, the UTI, and the QTc of 494 msec on EKG this admission.   He indicated it is safe for the patient to continue on the investigational medication as taken prior to admission.  Clayburn Pert, PharmD, BCPS Pager: [phone] 10/04/2014  2:37 PM

## 2014-10-04 NOTE — Progress Notes (Signed)
Physical Therapy Treatment Patient Details Name: Zadaya Cuadra MRN: [id number] DOB: August 12, 1946 Today's Date: 10/04/2014    History of Present Illness On 2/12/16y patient had a fall while trying to get up into the car. In emergency department UA showed evidence of UTI. [name] Patient presented to [facility] ER and was found to have Right trimalleolar ankle fracture which was treated with a cast.. She presented back to ER on 10/01/14 and was found to have right fibular fracture. Patient has known hx of Brain metastasis from lung CA .S/P ORIF ankle fx 10/02/14., R Fib fx treated with KI.    PT Comments    Pt declined any attempt to get OOB but did agree to LE TE's.  L casted LE elevated.  Performed AAROM B LE's while supine in bed then assisted NT with rolling to apply bed pan.    Follow Up Recommendations  SNF     Equipment Recommendations       Recommendations for Other Services       Precautions / Restrictions Precautions Precautions: Fall Required Braces or Orthoses: Knee Immobilizer - Right Knee Immobilizer - Right: On when out of bed or walking Restrictions Weight Bearing Restrictions: Yes RLE Weight Bearing: Weight bearing as tolerated LLE Weight Bearing: Non weight bearing    Mobility  Bed Mobility Overal bed mobility: Needs Assistance Bed Mobility: Rolling Rolling: Max assist         General bed mobility comments: max assist rolling to L with NT to apply bed pan and assist with self care.    Transfers                 General transfer comment: pt declined any attempt to transfer  Ambulation/Gait                 Stairs            Wheelchair Mobility    Modified Rankin (Stroke Patients Only)       Balance                                    Cognition Arousal/Alertness: Awake/alert Behavior During Therapy: WFL for tasks assessed/performed Overall Cognitive Status: Within Functional Limits for tasks assessed                      Exercises  R LE AP, knee presses, SAQ's, ABD, SLR and heel slides 15 reps  L LE toes scrunches, knee presses, heel slides, SAQ's, SLR and ABD AAROM    General Comments        Pertinent Vitals/Pain Pain Assessment: No/denies pain    Home Living                      Prior Function            PT Goals (current goals can now be found in the care plan section) Progress towards PT goals: Progressing toward goals    Frequency  Min 3X/week    PT Plan      Co-evaluation             End of Session   Activity Tolerance: Patient tolerated treatment well Patient left: in bed;with call bell/phone within reach;with family/visitor present     Time: 1400-1420 PT Time Calculation (min) (ACUTE ONLY): 20 min  Charges:  $Therapeutic Exercise: 8-22 mins  G Codes:      Rica Koyanagi  PTA [facility]  Acute  Rehab Pager      [phone]

## 2014-10-04 NOTE — Progress Notes (Signed)
Attempted x 2 to contact [place] at [phone] to confirm that patient's current clinical status allows continued safe use of her study drug and to obtain information about the study drug for monitoring of potential ADEs.   Reached an answering machine both times.  Left a message requesting callback.  Clayburn Pert, PharmD, BCPS Pager: [phone] 10/04/2014  10:34 AM

## 2014-10-04 NOTE — Progress Notes (Signed)
Subjective: 2 Days Post-Op Procedure(s) (LRB): OPEN REDUCTION INTERNAL FIXATION (ORIF) ANKLE FRACTURE (Left) Patient reports pain as mild.    Objective: Vital signs in last 24 hours: Temp:  [97.4 F (36.3 C)-98.5 F (36.9 C)] 97.4 F (36.3 C) (02/15 0550) Pulse Rate:  [87-93] 87 (02/15 0550) Resp:  [16-20] 16 (02/15 0734) BP: (99-133)/(55-74) 99/55 mmHg (02/15 0550) SpO2:  [96 %-100 %] 96 % (02/15 0734)  Intake/Output from previous day: 02/14 0701 - 02/15 0700 In: 1850 [I.V.:1800; IV Piggyback:50] Out: 325 [Urine:325] Intake/Output this shift:     Recent Labs  10/01/14 1918 10/02/14 0315 10/04/14 0540  HGB 12.5 11.7* 11.0*    Recent Labs  10/02/14 0315 10/04/14 0540  WBC 5.4 7.8  RBC 4.01 3.74*  HCT 37.0 34.3*  PLT 174 166    Recent Labs  10/02/14 0315 10/04/14 0540  NA 139 135  K 3.3* 4.0  CL 105 104  CO2 27 26  BUN 12 10  CREATININE 0.81 0.63  GLUCOSE 98 114*  CALCIUM 8.8 8.5   No results for input(s): LABPT, INR in the last 72 hours.  Neurologically intact ABD soft Neurovascular intact Sensation intact distally Intact pulses distally No cellulitis present Compartment soft  Assessment/Plan: 2 Days Post-Op Procedure(s) (LRB): OPEN REDUCTION INTERNAL FIXATION (ORIF) ANKLE FRACTURE (Left) Advance diet Up with therapy Discharge to SNF  Pt may be WBAT r with knee immobilizer in place and non- wt bearing l side F/U [place] 2 weeks  GRAVES,JOHN L 10/04/2014, 8:43 AM

## 2014-10-04 NOTE — Progress Notes (Signed)
Progress Note   Valerie [name] IBB:[id number] DOB: 06-07-1946 DOA: 10/01/2014 PCP: [name], MD   Brief Narrative:   Valerie [name] is an 69 y.o. female with a PMH of lung cancer with brain metastasis, dementia, hypertension and thyroid cancer who was admitted 10/01/14 after suffering from a mechanical fall resulting in a left trimalleolar ankle fracture/dislocation as well as proximal right fibular fracture with inability to bear weight. Patient has been seen and evaluated by orthopedics with plan for an open reduction internal fixation of her left ankle fracture  Assessment/Plan:   Principal Problem:   Fall resulting in a left trimalleolar fracture and proximal right fibular fracture with inability to bear weight  S/P ORIF of left ankle fracture 10/02/14. No surgical treatment recommended for right closed proximal fibular fracture. Continue knee immobilizer for comfort.  Up with PT, non-weightbearing LLE, weight bear as tolerated RLE with knee immobilizer.  SNF placement.  Active Problems:   Hypokalemia  Repleted.    Lung cancer  Has been on an experimental study medication [id number] through [facility].  Admitting physician notified them of the patient's admission, and this medication is currently being administered by her family.    Dementia  Continue Aricept.    UTI (lower urinary tract infection)  Admission UA showed nitrites with moderate leukocytes. Microscopy with many bacteria.  D/C empiric Rocephin after 3 days of therapy.  Cultures not sent.    Reactive airway disease  Albuterol ordered as needed.    DVT Prophylaxis  Start Lovenox.  Code Status: Full. Family Communication: Multiple family updated at bedside 10/02/14. Disposition Plan: SNF 10/05/14.   IV Access:    Peripheral IV   Procedures and diagnostic studies:   Dg Ankle Complete Right 10/01/2014: No acute fracture or dislocation identified about the right ankle.      Dg Knee Complete 4 Views Right 10/01/2014: Nondisplaced proximal fibular fracture.     Dg Foot Complete Right 10/01/2014: Negative.     Medical Consultants:    Dr. Dorna Leitz, Orthopedics.  Anti-Infectives:    Rocephin 10/01/14--->10/04/14  Subjective:   Valerie [name] is having post-op soreness.  No nausea, vomiting, dyspnea or chest pain.  Last BM was yesterday.  Complains of a sore throat.  Objective:    Filed Vitals:   10/03/14 1600 10/03/14 2120 10/04/14 0550 10/04/14 0734  BP:  133/74 99/55   Pulse:  93 87   Temp:  98.5 F (36.9 C) 97.4 F (36.3 C)   TempSrc:  Oral Oral   Resp: 20 18 16 16   Height:      Weight:      SpO2: 98% 98% 96% 96%    Intake/Output Summary (Last 24 hours) at 10/04/14 0825 Last data filed at 10/04/14 0600  Gross per 24 hour  Intake   1850 ml  Output    325 ml  Net   1525 ml    Exam: Gen:  NAD Cardiovascular:  Tachy/reg, II/VI systolic murmur Respiratory:  Lungs CTAB Gastrointestinal:  Abdomen soft, NT/ND, + BS Extremities:  Has extensive LLE soft cast in place.   Data Reviewed:    Labs: Basic Metabolic Panel:  Recent Labs Lab 10/01/14 1918 10/02/14 0315 10/04/14 0540  NA 138 139 135  K 3.7 3.3* 4.0  CL 105 105 104  CO2 23 27 26   GLUCOSE 168* 98 114*  BUN 11 12 10   CREATININE 0.89 0.81 0.63  CALCIUM 9.1 8.8 8.5   GFR Estimated Creatinine Clearance: 79.3  mL/min (by C-G formula based on Cr of 0.63).  CBC:  Recent Labs Lab 10/01/14 1918 10/02/14 0315 10/04/14 0540  WBC 9.3 5.4 7.8  NEUTROABS 8.1*  --   --   HGB 12.5 11.7* 11.0*  HCT 39.2 37.0 34.3*  MCV 92.7 92.3 91.7  PLT 181 174 166   Microbiology Recent Results (from the past 240 hour(s))  Surgical pcr screen     Status: Abnormal   Collection Time: 10/02/14  6:30 AM  Result Value Ref Range Status   MRSA, PCR NEGATIVE NEGATIVE Final   Staphylococcus aureus POSITIVE (A) NEGATIVE Final    Comment:        The Xpert SA Assay (FDA approved for  NASAL specimens in patients over 48 years of age), is one component of a comprehensive surveillance program.  Test performance has been validated by [facility] for patients greater than or equal to 51 year old. It is not intended to diagnose infection nor to guide or monitor treatment.      Medications:   . cefTRIAXone (ROCEPHIN) IVPB 1 gram/50 mL D5W  1 g Intravenous Q24H  . Chlorhexidine Gluconate Cloth  6 each Topical Daily  . docusate sodium  100 mg Oral BID  . donepezil  10 mg Oral QHS  . enoxaparin (LOVENOX) injection  40 mg Subcutaneous QHS  . mupirocin ointment  1 application Nasal BID   Continuous Infusions: . dextrose 5 % and 0.9% NaCl 75 mL/hr at 10/03/14 1736    Time spent: 25 minutes.    LOS: 2 days   RAMA,CHRISTINA  [facility] Pager [phone]. If unable to reach me by pager, please call my cell phone at [phone].  *Please refer to [email or web address], password [email or web address] to get updated schedule on who will round on this patient, as hospitalists switch teams weekly. If 7PM-7AM, please contact night-coverage at [email or web address], password [email or web address] for any overnight needs.  10/04/2014, 8:25 AM

## 2014-10-05 ENCOUNTER — Telehealth (HOSPITAL_COMMUNITY): Payer: Self-pay

## 2014-10-05 ENCOUNTER — Encounter (HOSPITAL_COMMUNITY): Payer: Self-pay | Admitting: Orthopedic Surgery

## 2014-10-05 DIAGNOSIS — S82831A Other fracture of upper and lower end of right fibula, initial encounter for closed fracture: Secondary | ICD-10-CM | POA: Diagnosis not present

## 2014-10-05 DIAGNOSIS — J4520 Mild intermittent asthma, uncomplicated: Secondary | ICD-10-CM | POA: Diagnosis not present

## 2014-10-05 DIAGNOSIS — M84472D Pathological fracture, left ankle, subsequent encounter for fracture with routine healing: Secondary | ICD-10-CM | POA: Diagnosis not present

## 2014-10-05 DIAGNOSIS — W19XXXD Unspecified fall, subsequent encounter: Secondary | ICD-10-CM | POA: Diagnosis not present

## 2014-10-05 DIAGNOSIS — R112 Nausea with vomiting, unspecified: Secondary | ICD-10-CM | POA: Diagnosis present

## 2014-10-05 DIAGNOSIS — C3490 Malignant neoplasm of unspecified part of unspecified bronchus or lung: Secondary | ICD-10-CM | POA: Diagnosis not present

## 2014-10-05 DIAGNOSIS — Z9181 History of falling: Secondary | ICD-10-CM | POA: Diagnosis not present

## 2014-10-05 DIAGNOSIS — M84572D Pathological fracture in neoplastic disease, left ankle, subsequent encounter for fracture with routine healing: Secondary | ICD-10-CM | POA: Diagnosis not present

## 2014-10-05 DIAGNOSIS — K5900 Constipation, unspecified: Secondary | ICD-10-CM | POA: Diagnosis not present

## 2014-10-05 DIAGNOSIS — I82403 Acute embolism and thrombosis of unspecified deep veins of lower extremity, bilateral: Secondary | ICD-10-CM | POA: Diagnosis present

## 2014-10-05 DIAGNOSIS — R339 Retention of urine, unspecified: Secondary | ICD-10-CM | POA: Diagnosis present

## 2014-10-05 DIAGNOSIS — S82401D Unspecified fracture of shaft of right fibula, subsequent encounter for closed fracture with routine healing: Secondary | ICD-10-CM | POA: Diagnosis not present

## 2014-10-05 DIAGNOSIS — Z9221 Personal history of antineoplastic chemotherapy: Secondary | ICD-10-CM | POA: Diagnosis not present

## 2014-10-05 DIAGNOSIS — M21272 Flexion deformity, left ankle and toes: Secondary | ICD-10-CM | POA: Diagnosis not present

## 2014-10-05 DIAGNOSIS — M47816 Spondylosis without myelopathy or radiculopathy, lumbar region: Secondary | ICD-10-CM | POA: Diagnosis not present

## 2014-10-05 DIAGNOSIS — K828 Other specified diseases of gallbladder: Secondary | ICD-10-CM | POA: Diagnosis not present

## 2014-10-05 DIAGNOSIS — N1330 Unspecified hydronephrosis: Secondary | ICD-10-CM | POA: Diagnosis not present

## 2014-10-05 DIAGNOSIS — C73 Malignant neoplasm of thyroid gland: Secondary | ICD-10-CM | POA: Diagnosis not present

## 2014-10-05 DIAGNOSIS — R600 Localized edema: Secondary | ICD-10-CM | POA: Diagnosis not present

## 2014-10-05 DIAGNOSIS — R5381 Other malaise: Secondary | ICD-10-CM | POA: Diagnosis not present

## 2014-10-05 DIAGNOSIS — S82433S Displaced oblique fracture of shaft of unspecified fibula, sequela: Secondary | ICD-10-CM | POA: Diagnosis not present

## 2014-10-05 DIAGNOSIS — Z79899 Other long term (current) drug therapy: Secondary | ICD-10-CM | POA: Diagnosis not present

## 2014-10-05 DIAGNOSIS — Z9889 Other specified postprocedural states: Secondary | ICD-10-CM | POA: Diagnosis not present

## 2014-10-05 DIAGNOSIS — R109 Unspecified abdominal pain: Secondary | ICD-10-CM | POA: Diagnosis not present

## 2014-10-05 DIAGNOSIS — C779 Secondary and unspecified malignant neoplasm of lymph node, unspecified: Secondary | ICD-10-CM | POA: Diagnosis present

## 2014-10-05 DIAGNOSIS — S82892D Other fracture of left lower leg, subsequent encounter for closed fracture with routine healing: Secondary | ICD-10-CM | POA: Diagnosis not present

## 2014-10-05 DIAGNOSIS — K2970 Gastritis, unspecified, without bleeding: Secondary | ICD-10-CM | POA: Diagnosis not present

## 2014-10-05 DIAGNOSIS — I82413 Acute embolism and thrombosis of femoral vein, bilateral: Secondary | ICD-10-CM | POA: Diagnosis not present

## 2014-10-05 DIAGNOSIS — F0390 Unspecified dementia without behavioral disturbance: Secondary | ICD-10-CM | POA: Diagnosis not present

## 2014-10-05 DIAGNOSIS — L03115 Cellulitis of right lower limb: Secondary | ICD-10-CM | POA: Diagnosis not present

## 2014-10-05 DIAGNOSIS — S99912A Unspecified injury of left ankle, initial encounter: Secondary | ICD-10-CM | POA: Diagnosis not present

## 2014-10-05 DIAGNOSIS — I10 Essential (primary) hypertension: Secondary | ICD-10-CM | POA: Diagnosis not present

## 2014-10-05 DIAGNOSIS — R140 Abdominal distension (gaseous): Secondary | ICD-10-CM | POA: Diagnosis not present

## 2014-10-05 DIAGNOSIS — M6281 Muscle weakness (generalized): Secondary | ICD-10-CM | POA: Diagnosis not present

## 2014-10-05 DIAGNOSIS — Z7982 Long term (current) use of aspirin: Secondary | ICD-10-CM | POA: Diagnosis not present

## 2014-10-05 DIAGNOSIS — C801 Malignant (primary) neoplasm, unspecified: Secondary | ICD-10-CM | POA: Diagnosis not present

## 2014-10-05 DIAGNOSIS — Z923 Personal history of irradiation: Secondary | ICD-10-CM | POA: Diagnosis not present

## 2014-10-05 DIAGNOSIS — G4700 Insomnia, unspecified: Secondary | ICD-10-CM | POA: Diagnosis not present

## 2014-10-05 DIAGNOSIS — K5641 Fecal impaction: Secondary | ICD-10-CM | POA: Diagnosis present

## 2014-10-05 DIAGNOSIS — K567 Ileus, unspecified: Secondary | ICD-10-CM | POA: Diagnosis present

## 2014-10-05 DIAGNOSIS — R1900 Intra-abdominal and pelvic swelling, mass and lump, unspecified site: Secondary | ICD-10-CM | POA: Diagnosis not present

## 2014-10-05 DIAGNOSIS — R262 Difficulty in walking, not elsewhere classified: Secondary | ICD-10-CM | POA: Diagnosis not present

## 2014-10-05 DIAGNOSIS — I82433 Acute embolism and thrombosis of popliteal vein, bilateral: Secondary | ICD-10-CM | POA: Diagnosis not present

## 2014-10-05 DIAGNOSIS — N390 Urinary tract infection, site not specified: Secondary | ICD-10-CM | POA: Diagnosis not present

## 2014-10-05 MED ORDER — DOCUSATE SODIUM 100 MG PO CAPS: 100.0000 mg | ORAL_CAPSULE | Freq: Two times a day (BID) | ORAL | Status: DC

## 2014-10-05 MED ORDER — POLYETHYLENE GLYCOL 3350 17 G PO PACK: 17.0000 g | PACK | Freq: Every day | ORAL | Status: DC | PRN

## 2014-10-05 MED ORDER — STUDY - INVESTIGATIONAL DRUG SIMPLE RECORD: Status: DC

## 2014-10-05 MED ORDER — ASPIRIN EC 81 MG PO TBEC: 81.0000 mg | DELAYED_RELEASE_TABLET | Freq: Every day | ORAL | Status: DC

## 2014-10-05 NOTE — Progress Notes (Signed)
Pt / spouse are aware and in agreement with plan to d/c to [facility] today.[facility] transport required. Pt is aware out of pocket costs may be associated with [facility] transport. NSG reviewed d/c summary, scripts, avs. Scripts included in d/c packet. D/C summary sent to SNF for review prior to d/c.   Roselyn Reef haidinger LCSW [phone]

## 2014-10-05 NOTE — Discharge Summary (Signed)
Physician Discharge Summary  Valerie [name] QMG:[id number] DOB: 1946-03-19 DOA: 10/01/2014  PCP: [name], MD  Admit date: 10/01/2014 Discharge date: 10/05/2014   Recommendations for Outpatient Follow-Up:   1. The patient will need to continue her study medication.  She can take her own supply and self-administer it.   Discharge Diagnosis:   Principal Problem:    Fall resulting in a left trimalleolar ankle fracture and right fibular fracture Active Problems:    Ankle fracture    Lung cancer    Closed fibular fracture    Dementia    UTI (lower urinary tract infection)    Reactive airway disease   Discharge Condition: Improved.  Diet recommendation: Low sodium, heart healthy.     History of Present Illness:   Valerie [name] is an 69 y.o. female with a PMH of lung cancer with brain metastasis, dementia, hypertension and thyroid cancer who was admitted 10/01/14 after suffering from a mechanical fall resulting in a left trimalleolar ankle fracture/dislocation as well as proximal right fibular fracture with inability to bear weight. Patient has been seen and evaluated by orthopedics with plan for an open reduction internal fixation of her left ankle fracture.   Hospital Course by Problem:   Principal Problem:  Fall resulting in a left trimalleolar fracture and proximal right fibular fracture with inability to bear weight  S/P ORIF of left ankle fracture 10/02/14. No surgical treatment recommended for right closed proximal fibular fracture. Continue knee immobilizer for comfort.  Up with PT, non-weightbearing LLE, weight bear as tolerated RLE with knee immobilizer.  D/C to SNF for rehab.  Active Problems:  Hypokalemia  Repleted.   Lung cancer  Has been on an experimental study medication [phone] through [facility].  Admitting physician notified them of the patient's admission, and this medication is currently being administered by her  family.  Safe to continue.   Dementia  Continue Aricept.   UTI (lower urinary tract infection)  Admission UA showed nitrites with moderate leukocytes. Microscopy with many bacteria.  D/C'd empiric Rocephin after 3 days of therapy.  Cultures not sent.   Reactive airway disease  Albuterol ordered as needed.    Medical Consultants:    Dr. Dorna Leitz, Orthopedics.   Discharge Exam:   Filed Vitals:   10/05/14 0728  BP:   Pulse:   Temp:   Resp: 18   Filed Vitals:   10/05/14 0000 10/05/14 0400 10/05/14 0508 10/05/14 0728  BP:   107/66   Pulse:   89   Temp:   99 F (37.2 C)   TempSrc:   Oral   Resp: 18 16 18 18   Height:      Weight:      SpO2: 96% 99% 95% 95%    Gen: NAD Cardiovascular: Tachy/reg, II/VI systolic murmur Respiratory: Lungs CTAB Gastrointestinal: Abdomen soft, NT/ND, + BS Extremities: Has LLE soft cast in place.   The results of significant diagnostics from this hospitalization (including imaging, microbiology, ancillary and laboratory) are listed below for reference.     Procedures and Diagnostic Studies:   Dg Ankle Complete Right 10/01/2014: No acute fracture or dislocation identified about the right ankle.   Dg Knee Complete 4 Views Right 10/01/2014: Nondisplaced proximal fibular fracture.   Dg Foot Complete Right 10/01/2014: Negative.    Labs:   Basic Metabolic Panel:  Recent Labs Lab 10/01/14 1918 10/02/14 0315 10/04/14 0540  NA 138 139 135  K 3.7 3.3* 4.0  CL 105 105 104  CO2 23 27 26   GLUCOSE 168* 98 114*  BUN 11 12 10   CREATININE 0.89 0.81 0.63  CALCIUM 9.1 8.8 8.5   GFR Estimated Creatinine Clearance: 79.3 mL/min (by C-G formula based on Cr of 0.63).  CBC:  Recent Labs Lab 10/01/14 1918 10/02/14 0315 10/04/14 0540  WBC 9.3 5.4 7.8  NEUTROABS 8.1*  --   --   HGB 12.5 11.7* 11.0*  HCT 39.2 37.0 34.3*  MCV 92.7 92.3 91.7  PLT 181 174 166   Microbiology Recent Results (from the past 240  hour(s))  Surgical pcr screen     Status: Abnormal   Collection Time: 10/02/14  6:30 AM  Result Value Ref Range Status   MRSA, PCR NEGATIVE NEGATIVE Final   Staphylococcus aureus POSITIVE (A) NEGATIVE Final    Comment:        The Xpert SA Assay (FDA approved for NASAL specimens in patients over 81 years of age), is one component of a comprehensive surveillance program.  Test performance has been validated by [facility] for patients greater than or equal to 90 year old. It is not intended to diagnose infection nor to guide or monitor treatment.      Discharge Instructions:   Discharge Instructions    Call MD for:  redness, tenderness, or signs of infection (pain, swelling, redness, odor or green/yellow discharge around incision site)    Complete by:  As directed      Call MD for:  severe uncontrolled pain    Complete by:  As directed      Call MD for:  temperature >100.4    Complete by:  As directed      Diet - low sodium heart healthy    Complete by:  As directed      Discharge instructions    Complete by:  As directed   You were cared for by Dr. Jacquelynn Cree  (a hospitalist) during your hospital stay. If you have any questions about your discharge medications or the care you received while you were in the hospital after you are discharged, you can call the unit and ask to speak with the hospitalist on call if the hospitalist that took care of you is not available. Once you are discharged, your primary care physician will handle any further medical issues. Please note that NO REFILLS for any discharge medications will be authorized once you are discharged, as it is imperative that you return to your primary care physician (or establish a relationship with a primary care physician if you do not have one) for your aftercare needs so that they can reassess your need for medications and monitor your lab values.  Any outstanding tests can be reviewed by your PCP at your follow up  visit.  It is also important to review any medicine changes with your PCP.  Please bring these d/c instructions with you to your next visit so your physician can review these changes with you.  If you do not have a primary care physician, you can call [phone] for a physician referral.  It is highly recommended that you obtain a PCP for hospital follow up.     Increase activity slowly    Complete by:  As directed      Non weight bearing    Complete by:  As directed   Laterality:  left  Extremity:  Lower     Walk with assistance    Complete by:  As directed  Weight bearing as tolerated    Complete by:  As directed   Laterality:  right  Extremity:  Lower  With knee immobilizer            Medication List    TAKE these medications        aspirin EC 81 MG tablet  Take 1 tablet (81 mg total) by mouth daily.     cholecalciferol 1000 UNITS tablet  Commonly known as:  VITAMIN D  Take 1,000 Units by mouth at bedtime.     docusate sodium 100 MG capsule  Commonly known as:  COLACE  Take 1 capsule (100 mg total) by mouth 2 (two) times daily.     donepezil 10 MG tablet  Commonly known as:  ARICEPT  Take 10 mg by mouth at bedtime.     HYDROcodone-acetaminophen 5-325 MG per tablet  Commonly known as:  NORCO  Take 1-2 tablets by mouth every 6 (six) hours as needed for severe pain.     MULTIVITAMIN & MINERAL PO  Take 1 tablet by mouth at bedtime.     NON FORMULARY  Take 300 mg by mouth at bedtime. Take 3 Tablets (100 mg) By Mouth Every Evening. Study Medication for Lung Cancer called [id number].     polyethylene glycol packet  Commonly known as:  MIRALAX / GLYCOLAX  Take 17 g by mouth daily as needed for mild constipation.     research study medication  Take home supply.     vitamin C 500 MG tablet  Commonly known as:  ASCORBIC ACID  Take 500 mg by mouth at bedtime.           Follow-up Information    Follow up with GRAVES,JOHN L, MD. Schedule an appointment as soon as  possible for a visit in 2 weeks.   Specialty:  Orthopedic Surgery   Contact information:   [place] [address] [phone]       Follow up with [name], MD. Schedule an appointment as soon as possible for a visit in 4 weeks.   Specialty:  Internal Medicine   Why:  Hospital follow up.   Contact information:   [address] [place] [address] [phone]        Time coordinating discharge: 35 minutes.  Signed:  RAMA,CHRISTINA  Pager [phone] [facility] 10/05/2014, 11:43 AM

## 2014-10-05 NOTE — Progress Notes (Signed)
CSW assisting with d/c planning. Pt has a SNF bed at [facility] today if stable for d/c.  Werner Lean LCSW [phone]

## 2014-10-05 NOTE — Discharge Instructions (Signed)
Ankle Fracture  A fracture is a break in a bone. The ankle joint is made up of three bones. These include the lower (distal)sections of your lower leg bones, called the tibia and fibula, along with a bone in your foot, called the talus. Depending on how bad the break is and if more than one ankle joint bone is broken, a cast or splint is used to protect and keep your injured bone from moving while it heals. Sometimes, surgery is required to help the fracture heal properly.   There are two general types of fractures:   Stable fracture. This includes a single fracture line through one bone, with no injury to ankle ligaments. A fracture of the talus that does not have any displacement (movement of the bone on either side of the fracture line) is also stable.   Unstable fracture. This includes more than one fracture line through one or more bones in the ankle joint. It also includes fractures that have displacement of the bone on either side of the fracture line.  CAUSES   A direct blow to the ankle.    Quickly and severely twisting your ankle.   Trauma, such as a car accident or falling from a significant height.  RISK FACTORS  You may be at a higher risk of ankle fracture if:   You have certain medical conditions.   You are involved in high-impact sports.   You are involved in a high-impact car accident.  SIGNS AND SYMPTOMS    Tender and swollen ankle.   Bruising around the injured ankle.   Pain on movement of the ankle.   Difficulty walking or putting weight on the ankle.   A cold foot below the site of the ankle injury. This can occur if the blood vessels passing through your injured ankle were also damaged.   Numbness in the foot below the site of the ankle injury.  DIAGNOSIS   An ankle fracture is usually diagnosed with a physical exam and X-rays. A CT scan may also be required for complex fractures.  TREATMENT   Stable fractures are treated with a cast or splint and using crutches to avoid putting  weight on your injured ankle. This is followed by an ankle strengthening program. Some patients require a special type of cast, depending on other medical problems they may have. Unstable fractures require surgery to ensure the bones heal properly. Your health care provider will tell you what type of fracture you have and the best treatment for your condition.  HOME CARE INSTRUCTIONS    Review correct crutch use with your health care provider and use your crutches as directed. Safe use of crutches is extremely important. Misuse of crutches can cause you to fall or cause injury to nerves in your hands or armpits.   Do not put weight or pressure on the injured ankle until directed by your health care provider.   To lessen the swelling, keep the injured leg elevated while sitting or lying down.   Apply ice to the injured area:   Put ice in a plastic bag.   Place a towel between your cast and the bag.   Leave the ice on for 20 minutes, 2-3 times a day.   If you have a plaster or fiberglass cast:   Do not try to scratch the skin under the cast with any objects. This can increase your risk of skin infection.   Check the skin around the cast every day. You   may put lotion on any red or sore areas.   Keep your cast dry and clean.   If you have a plaster splint:   Wear the splint as directed.   You may loosen the elastic around the splint if your toes become numb, tingle, or turn cold or blue.   Do not put pressure on any part of your cast or splint; it may break. Rest your cast only on a pillow the first 24 hours until it is fully hardened.   Your cast or splint can be protected during bathing with a plastic bag sealed to your skin with medical tape. Do not lower the cast or splint into water.   Take medicines as directed by your health care provider. Only take over-the-counter or prescription medicines for pain, discomfort, or fever as directed by your health care provider.   Do not drive a vehicle until  your health care provider specifically tells you it is safe to do so.   If your health care provider has given you a follow-up appointment, it is very important to keep that appointment. Not keeping the appointment could result in a chronic or permanent injury, pain, and disability. If you have any problem keeping the appointment, call the facility for assistance.  SEEK MEDICAL CARE IF:  You develop increased swelling or discomfort.  SEEK IMMEDIATE MEDICAL CARE IF:    Your cast gets damaged or breaks.   You have continued severe pain.   You develop new pain or swelling after the cast was put on.   Your skin or toenails below the injury turn blue or gray.   Your skin or toenails below the injury feel cold, numb, or have loss of sensitivity to touch.   There is a bad smell or pus draining from under the cast.  MAKE SURE YOU:    Understand these instructions.   Will watch your condition.   Will get help right away if you are not doing well or get worse.  Document Released: 08/03/2000 Document Revised: 08/11/2013 Document Reviewed: 03/05/2013  ExitCare Patient Information 2015 [employer]. This information is not intended to replace advice given to you by your health care provider. Make sure you discuss any questions you have with your health care provider.

## 2014-10-05 NOTE — Telephone Encounter (Signed)
Post ED Visit - Positive Culture Follow-up  Culture report reviewed by antimicrobial stewardship pharmacist: []  Wes Dulaney, Pharm.D., BCPS []  Heide Guile, Pharm.D., BCPS [x]  Alycia Rossetti, Pharm.D., BCPS []  [place], Pharm.D., BCPS, AAHIVP []  Legrand Como, Pharm.D., BCPS, AAHIVP []  Isac Sarna, Pharm.D., BCPS  Positive URNC culture, >/= 100,000 colonies -> E Coli Treated with Cephalexin, organism sensitive to the same and no further patient follow-up is required at this time.  Dortha Kern 10/05/2014, 5:13 AM

## 2014-10-05 NOTE — Progress Notes (Signed)
Report called and given to nurse Colletta Maryland at [facility].  All questions answer, pt dressed, IV out, ready for transport.  Husband present at bedside.

## 2014-10-05 NOTE — Progress Notes (Signed)
Clinical Social Work Department 10/05/14 CLINICAL SOCIAL WORK PLACEMENT NOTEPatientINDYA, Valerie [name] Account Number: [id number] Admit date: 10/01/2014  Clinical Social Worker: Werner Lean CLINICAL SOCIAL WORKER Date/time: 10/05/14 15:24 Clinical Social Work is seeking post-discharge placement for this patient at the following level of care: SKILLED NURSING (*CSW will update this form in Epic as items are completed)   10/02/2014 Patient/family provided with [place] Department of Clinical Social Work's list of facilities offering this level of care within the geographic area requested by the patient (or if unable, by the patient's family).  10/02/2014 Patient/family informed of their freedom to choose among providers that offer the needed level of care, that participate in Medicare, Medicaid or managed care program needed by the patient, have an available bed and are willing to accept the patient.  10/02/2014 Patient/family informed of [facility]' ownership interest in [facility], as well as of the fact that they are under no obligation to receive care at this facility.  PASARR submitted to EDS on  PASARR number received on   FL2 transmitted to all facilities in geographic area requested by pt/family on 10/02/2014 FL2 transmitted to all facilities within larger geographic area on   Patient informed that his/her managed care company has contracts with or will negotiate with certain facilities, including the following:    Patient/family informed of bed offers received: 10/04/14 Patient chooses bed at [place] Physician recommends and patient chooses bed at   Patient to be transferred to [place] on  Patient to be transferred to facility by [facility] Patient and family notified of transfer on 10/05/14 Name of family member notified: Spouse  The following physician request were entered in Epic:    Additional Comments:

## 2014-10-06 ENCOUNTER — Telehealth (HOSPITAL_BASED_OUTPATIENT_CLINIC_OR_DEPARTMENT_OTHER): Payer: Self-pay | Admitting: Emergency Medicine

## 2014-10-06 NOTE — Telephone Encounter (Signed)
Post ED Visit - Positive Culture Follow-up  Culture report reviewed by antimicrobial stewardship pharmacist: []  Wes Dulaney, Pharm.D., BCPS []  Heide Guile, Pharm.D., BCPS []  Alycia Rossetti, Pharm.D., BCPS []  [place], Pharm.D., BCPS, AAHIVP []  Legrand Como, Pharm.D., BCPS, AAHIVP [x]  Isac Sarna, Pharm.D., BCPS  Positive urine culture E. coli Treated with cephalexin, organism sensitive to the same and no further patient follow-up is required at this time.  Hazle Nordmann 10/06/2014, 10:50 AM

## 2014-10-07 DIAGNOSIS — I10 Essential (primary) hypertension: Secondary | ICD-10-CM | POA: Diagnosis not present

## 2014-10-07 DIAGNOSIS — S82831A Other fracture of upper and lower end of right fibula, initial encounter for closed fracture: Secondary | ICD-10-CM | POA: Insufficient documentation

## 2014-10-07 DIAGNOSIS — M84472D Pathological fracture, left ankle, subsequent encounter for fracture with routine healing: Secondary | ICD-10-CM | POA: Diagnosis not present

## 2014-10-07 DIAGNOSIS — G4700 Insomnia, unspecified: Secondary | ICD-10-CM | POA: Diagnosis not present

## 2014-10-07 DIAGNOSIS — F0390 Unspecified dementia without behavioral disturbance: Secondary | ICD-10-CM | POA: Diagnosis not present

## 2014-10-08 ENCOUNTER — Encounter (HOSPITAL_COMMUNITY): Payer: Self-pay | Admitting: Internal Medicine

## 2014-10-12 DIAGNOSIS — R262 Difficulty in walking, not elsewhere classified: Secondary | ICD-10-CM | POA: Diagnosis not present

## 2014-10-12 DIAGNOSIS — R5381 Other malaise: Secondary | ICD-10-CM | POA: Diagnosis not present

## 2014-10-14 DIAGNOSIS — R5381 Other malaise: Secondary | ICD-10-CM | POA: Diagnosis not present

## 2014-10-14 DIAGNOSIS — R262 Difficulty in walking, not elsewhere classified: Secondary | ICD-10-CM | POA: Diagnosis not present

## 2014-10-15 DIAGNOSIS — K5900 Constipation, unspecified: Secondary | ICD-10-CM | POA: Diagnosis not present

## 2014-10-17 DIAGNOSIS — S82433S Displaced oblique fracture of shaft of unspecified fibula, sequela: Secondary | ICD-10-CM | POA: Diagnosis not present

## 2014-10-17 DIAGNOSIS — L03115 Cellulitis of right lower limb: Secondary | ICD-10-CM | POA: Diagnosis not present

## 2014-10-17 DIAGNOSIS — R600 Localized edema: Secondary | ICD-10-CM | POA: Diagnosis not present

## 2014-10-18 DIAGNOSIS — R5381 Other malaise: Secondary | ICD-10-CM | POA: Diagnosis not present

## 2014-10-18 DIAGNOSIS — R262 Difficulty in walking, not elsewhere classified: Secondary | ICD-10-CM | POA: Diagnosis not present

## 2014-10-19 DIAGNOSIS — I82403 Acute embolism and thrombosis of unspecified deep veins of lower extremity, bilateral: Secondary | ICD-10-CM | POA: Diagnosis present

## 2014-10-19 DIAGNOSIS — S82831A Other fracture of upper and lower end of right fibula, initial encounter for closed fracture: Secondary | ICD-10-CM | POA: Diagnosis not present

## 2014-10-19 DIAGNOSIS — R112 Nausea with vomiting, unspecified: Secondary | ICD-10-CM | POA: Diagnosis present

## 2014-10-19 DIAGNOSIS — R262 Difficulty in walking, not elsewhere classified: Secondary | ICD-10-CM | POA: Diagnosis not present

## 2014-10-19 DIAGNOSIS — N1330 Unspecified hydronephrosis: Secondary | ICD-10-CM | POA: Diagnosis not present

## 2014-10-19 DIAGNOSIS — Z9221 Personal history of antineoplastic chemotherapy: Secondary | ICD-10-CM | POA: Diagnosis not present

## 2014-10-19 DIAGNOSIS — C3490 Malignant neoplasm of unspecified part of unspecified bronchus or lung: Secondary | ICD-10-CM | POA: Diagnosis present

## 2014-10-19 DIAGNOSIS — M47816 Spondylosis without myelopathy or radiculopathy, lumbar region: Secondary | ICD-10-CM | POA: Diagnosis not present

## 2014-10-19 DIAGNOSIS — K828 Other specified diseases of gallbladder: Secondary | ICD-10-CM | POA: Diagnosis not present

## 2014-10-19 DIAGNOSIS — K567 Ileus, unspecified: Secondary | ICD-10-CM | POA: Diagnosis present

## 2014-10-19 DIAGNOSIS — M84572D Pathological fracture in neoplastic disease, left ankle, subsequent encounter for fracture with routine healing: Secondary | ICD-10-CM | POA: Diagnosis not present

## 2014-10-19 DIAGNOSIS — Z9889 Other specified postprocedural states: Secondary | ICD-10-CM | POA: Diagnosis not present

## 2014-10-19 DIAGNOSIS — N390 Urinary tract infection, site not specified: Secondary | ICD-10-CM | POA: Diagnosis not present

## 2014-10-19 DIAGNOSIS — R1900 Intra-abdominal and pelvic swelling, mass and lump, unspecified site: Secondary | ICD-10-CM | POA: Diagnosis not present

## 2014-10-19 DIAGNOSIS — R109 Unspecified abdominal pain: Secondary | ICD-10-CM | POA: Diagnosis not present

## 2014-10-19 DIAGNOSIS — C779 Secondary and unspecified malignant neoplasm of lymph node, unspecified: Secondary | ICD-10-CM | POA: Diagnosis present

## 2014-10-19 DIAGNOSIS — Z79899 Other long term (current) drug therapy: Secondary | ICD-10-CM | POA: Diagnosis not present

## 2014-10-19 DIAGNOSIS — G4700 Insomnia, unspecified: Secondary | ICD-10-CM | POA: Diagnosis not present

## 2014-10-19 DIAGNOSIS — M6281 Muscle weakness (generalized): Secondary | ICD-10-CM | POA: Diagnosis not present

## 2014-10-19 DIAGNOSIS — K2970 Gastritis, unspecified, without bleeding: Secondary | ICD-10-CM | POA: Diagnosis not present

## 2014-10-19 DIAGNOSIS — C73 Malignant neoplasm of thyroid gland: Secondary | ICD-10-CM | POA: Diagnosis not present

## 2014-10-19 DIAGNOSIS — I82433 Acute embolism and thrombosis of popliteal vein, bilateral: Secondary | ICD-10-CM | POA: Diagnosis not present

## 2014-10-19 DIAGNOSIS — F0390 Unspecified dementia without behavioral disturbance: Secondary | ICD-10-CM | POA: Diagnosis present

## 2014-10-19 DIAGNOSIS — R339 Retention of urine, unspecified: Secondary | ICD-10-CM | POA: Diagnosis present

## 2014-10-19 DIAGNOSIS — Z9181 History of falling: Secondary | ICD-10-CM | POA: Diagnosis not present

## 2014-10-19 DIAGNOSIS — I10 Essential (primary) hypertension: Secondary | ICD-10-CM | POA: Diagnosis present

## 2014-10-19 DIAGNOSIS — R140 Abdominal distension (gaseous): Secondary | ICD-10-CM | POA: Diagnosis not present

## 2014-10-19 DIAGNOSIS — L03115 Cellulitis of right lower limb: Secondary | ICD-10-CM | POA: Diagnosis present

## 2014-10-19 DIAGNOSIS — R5381 Other malaise: Secondary | ICD-10-CM | POA: Diagnosis not present

## 2014-10-19 DIAGNOSIS — C801 Malignant (primary) neoplasm, unspecified: Secondary | ICD-10-CM | POA: Diagnosis not present

## 2014-10-19 DIAGNOSIS — K5641 Fecal impaction: Secondary | ICD-10-CM | POA: Diagnosis present

## 2014-10-19 DIAGNOSIS — Z7982 Long term (current) use of aspirin: Secondary | ICD-10-CM | POA: Diagnosis not present

## 2014-10-19 DIAGNOSIS — Z923 Personal history of irradiation: Secondary | ICD-10-CM | POA: Diagnosis not present

## 2014-10-20 ENCOUNTER — Encounter (HOSPITAL_COMMUNITY): Payer: Self-pay | Admitting: Emergency Medicine

## 2014-10-20 ENCOUNTER — Emergency Department (HOSPITAL_COMMUNITY): Payer: Medicare Other

## 2014-10-20 ENCOUNTER — Inpatient Hospital Stay (HOSPITAL_COMMUNITY): Payer: Medicare Other

## 2014-10-20 ENCOUNTER — Inpatient Hospital Stay (HOSPITAL_COMMUNITY)
Admission: EM | Admit: 2014-10-20 | Discharge: 2014-10-22 | DRG: 389 | Disposition: A | Discharge status: Skilled Nursing Facility | Payer: Medicare Other | Attending: Family Medicine | Admitting: Family Medicine

## 2014-10-20 DIAGNOSIS — Z923 Personal history of irradiation: Secondary | ICD-10-CM | POA: Diagnosis not present

## 2014-10-20 DIAGNOSIS — R1084 Generalized abdominal pain: Secondary | ICD-10-CM

## 2014-10-20 DIAGNOSIS — R339 Retention of urine, unspecified: Secondary | ICD-10-CM | POA: Diagnosis not present

## 2014-10-20 DIAGNOSIS — M84572D Pathological fracture in neoplastic disease, left ankle, subsequent encounter for fracture with routine healing: Secondary | ICD-10-CM | POA: Diagnosis not present

## 2014-10-20 DIAGNOSIS — Z9221 Personal history of antineoplastic chemotherapy: Secondary | ICD-10-CM | POA: Diagnosis not present

## 2014-10-20 DIAGNOSIS — R109 Unspecified abdominal pain: Secondary | ICD-10-CM | POA: Diagnosis not present

## 2014-10-20 DIAGNOSIS — Z79899 Other long term (current) drug therapy: Secondary | ICD-10-CM

## 2014-10-20 DIAGNOSIS — C801 Malignant (primary) neoplasm, unspecified: Secondary | ICD-10-CM | POA: Diagnosis not present

## 2014-10-20 DIAGNOSIS — G4700 Insomnia, unspecified: Secondary | ICD-10-CM | POA: Diagnosis not present

## 2014-10-20 DIAGNOSIS — I82409 Acute embolism and thrombosis of unspecified deep veins of unspecified lower extremity: Secondary | ICD-10-CM | POA: Diagnosis present

## 2014-10-20 DIAGNOSIS — K5641 Fecal impaction: Secondary | ICD-10-CM | POA: Diagnosis present

## 2014-10-20 DIAGNOSIS — M6281 Muscle weakness (generalized): Secondary | ICD-10-CM | POA: Diagnosis not present

## 2014-10-20 DIAGNOSIS — C73 Malignant neoplasm of thyroid gland: Secondary | ICD-10-CM | POA: Diagnosis not present

## 2014-10-20 DIAGNOSIS — C3490 Malignant neoplasm of unspecified part of unspecified bronchus or lung: Secondary | ICD-10-CM | POA: Diagnosis present

## 2014-10-20 DIAGNOSIS — I82403 Acute embolism and thrombosis of unspecified deep veins of lower extremity, bilateral: Secondary | ICD-10-CM | POA: Diagnosis not present

## 2014-10-20 DIAGNOSIS — Z7982 Long term (current) use of aspirin: Secondary | ICD-10-CM | POA: Diagnosis not present

## 2014-10-20 DIAGNOSIS — R600 Localized edema: Secondary | ICD-10-CM | POA: Diagnosis not present

## 2014-10-20 DIAGNOSIS — R112 Nausea with vomiting, unspecified: Secondary | ICD-10-CM | POA: Diagnosis not present

## 2014-10-20 DIAGNOSIS — L0390 Cellulitis, unspecified: Secondary | ICD-10-CM

## 2014-10-20 DIAGNOSIS — L03115 Cellulitis of right lower limb: Secondary | ICD-10-CM | POA: Diagnosis present

## 2014-10-20 DIAGNOSIS — K828 Other specified diseases of gallbladder: Secondary | ICD-10-CM | POA: Diagnosis not present

## 2014-10-20 DIAGNOSIS — C779 Secondary and unspecified malignant neoplasm of lymph node, unspecified: Secondary | ICD-10-CM | POA: Diagnosis present

## 2014-10-20 DIAGNOSIS — K567 Ileus, unspecified: Principal | ICD-10-CM

## 2014-10-20 DIAGNOSIS — I82503 Chronic embolism and thrombosis of unspecified deep veins of lower extremity, bilateral: Secondary | ICD-10-CM | POA: Diagnosis not present

## 2014-10-20 DIAGNOSIS — K2970 Gastritis, unspecified, without bleeding: Secondary | ICD-10-CM | POA: Diagnosis not present

## 2014-10-20 DIAGNOSIS — R1900 Intra-abdominal and pelvic swelling, mass and lump, unspecified site: Secondary | ICD-10-CM | POA: Diagnosis not present

## 2014-10-20 DIAGNOSIS — I10 Essential (primary) hypertension: Secondary | ICD-10-CM | POA: Diagnosis present

## 2014-10-20 DIAGNOSIS — Z9181 History of falling: Secondary | ICD-10-CM | POA: Diagnosis not present

## 2014-10-20 DIAGNOSIS — R140 Abdominal distension (gaseous): Secondary | ICD-10-CM | POA: Diagnosis not present

## 2014-10-20 DIAGNOSIS — M7989 Other specified soft tissue disorders: Secondary | ICD-10-CM | POA: Diagnosis not present

## 2014-10-20 DIAGNOSIS — K5660 Unspecified intestinal obstruction: Secondary | ICD-10-CM | POA: Diagnosis not present

## 2014-10-20 DIAGNOSIS — W19XXXA Unspecified fall, initial encounter: Secondary | ICD-10-CM

## 2014-10-20 DIAGNOSIS — F0390 Unspecified dementia without behavioral disturbance: Secondary | ICD-10-CM | POA: Diagnosis present

## 2014-10-20 DIAGNOSIS — M47816 Spondylosis without myelopathy or radiculopathy, lumbar region: Secondary | ICD-10-CM | POA: Diagnosis not present

## 2014-10-20 DIAGNOSIS — K5900 Constipation, unspecified: Secondary | ICD-10-CM | POA: Diagnosis not present

## 2014-10-20 DIAGNOSIS — N1330 Unspecified hydronephrosis: Secondary | ICD-10-CM | POA: Diagnosis not present

## 2014-10-20 LAB — CBC WITH DIFFERENTIAL/PLATELET
Basophils Absolute: 0 10*3/uL (ref 0.0–0.1)
Basophils Relative: 0 % (ref 0–1)
Eosinophils Absolute: 0 10*3/uL (ref 0.0–0.7)
Eosinophils Relative: 0 % (ref 0–5)
HCT: 37.4 % (ref 36.0–46.0)
Hemoglobin: 12.2 g/dL (ref 12.0–15.0)
Lymphocytes Relative: 3 % — ABNORMAL LOW (ref 12–46)
Lymphs Abs: 0.5 10*3/uL — ABNORMAL LOW (ref 0.7–4.0)
MCH: 29 pg (ref 26.0–34.0)
MCHC: 32.6 g/dL (ref 30.0–36.0)
MCV: 89 fL (ref 78.0–100.0)
Monocytes Absolute: 0.9 10*3/uL (ref 0.1–1.0)
Monocytes Relative: 6 % (ref 3–12)
Neutro Abs: 14.2 10*3/uL — ABNORMAL HIGH (ref 1.7–7.7)
Neutrophils Relative %: 91 % — ABNORMAL HIGH (ref 43–77)
Platelets: 399 10*3/uL (ref 150–400)
RBC: 4.2 MIL/uL (ref 3.87–5.11)
RDW: 14.2 % (ref 11.5–15.5)
WBC: 15.6 10*3/uL — ABNORMAL HIGH (ref 4.0–10.5)

## 2014-10-20 LAB — URINALYSIS, ROUTINE W REFLEX MICROSCOPIC
Bilirubin Urine: NEGATIVE
Glucose, UA: NEGATIVE mg/dL
Ketones, ur: NEGATIVE mg/dL
Leukocytes, UA: NEGATIVE
Nitrite: NEGATIVE
Protein, ur: NEGATIVE mg/dL
Specific Gravity, Urine: 1.025 (ref 1.005–1.030)
Urobilinogen, UA: 0.2 mg/dL (ref 0.0–1.0)
pH: 5.5 (ref 5.0–8.0)

## 2014-10-20 LAB — URINE MICROSCOPIC-ADD ON

## 2014-10-20 LAB — BASIC METABOLIC PANEL
Anion gap: 10 (ref 5–15)
BUN: 26 mg/dL — ABNORMAL HIGH (ref 6–23)
CO2: 24 mmol/L (ref 19–32)
Calcium: 9.4 mg/dL (ref 8.4–10.5)
Chloride: 96 mmol/L (ref 96–112)
Creatinine, Ser: 0.71 mg/dL (ref 0.50–1.10)
GFR calc Af Amer: >90 mL/min (ref >90)
GFR calc non Af Amer: 86 mL/min — ABNORMAL LOW (ref >90)
Glucose, Bld: 137 mg/dL — ABNORMAL HIGH (ref 70–99)
Potassium: 4 mmol/L (ref 3.5–5.1)
Sodium: 130 mmol/L — ABNORMAL LOW (ref 135–145)

## 2014-10-20 MED ORDER — SODIUM CHLORIDE 0.9 % IV SOLN: INTRAVENOUS | Status: DC

## 2014-10-20 MED ORDER — VANCOMYCIN HCL 10 G IV SOLR
1250.0000 mg | Freq: Two times a day (BID) | INTRAVENOUS | Status: DC
  Administered 2014-10-20 – 2014-10-21 (×2): 1250 mg via INTRAVENOUS
  Filled 2014-10-20 (×2): qty 1250

## 2014-10-20 MED ORDER — ONDANSETRON HCL 4 MG/2ML IJ SOLN
4.0000 mg | Freq: Once | INTRAMUSCULAR | Status: AC
  Administered 2014-10-20: 4 mg via INTRAVENOUS
  Filled 2014-10-20: qty 2

## 2014-10-20 MED ORDER — BISACODYL 10 MG RE SUPP: 10.0000 mg | Freq: Every day | RECTAL | Status: DC | PRN

## 2014-10-20 MED ORDER — NALOXEGOL OXALATE 25 MG PO TABS
1.0000 | ORAL_TABLET | Freq: Every day | ORAL | Status: DC
  Administered 2014-10-21 – 2014-10-22 (×2): 25 mg via ORAL
  Filled 2014-10-20 (×2): qty 1

## 2014-10-20 MED ORDER — IOHEXOL 300 MG/ML  SOLN
50.0000 mL | Freq: Once | INTRAMUSCULAR | Status: AC | PRN
  Administered 2014-10-20: 50 mL via ORAL

## 2014-10-20 MED ORDER — POLYETHYLENE GLYCOL 3350 17 G PO PACK: 17.0000 g | PACK | Freq: Every day | ORAL | Status: DC | PRN

## 2014-10-20 MED ORDER — MIRTAZAPINE 15 MG PO TABS
15.0000 mg | ORAL_TABLET | Freq: Every day | ORAL | Status: DC
  Administered 2014-10-20 – 2014-10-21 (×2): 15 mg via ORAL
  Filled 2014-10-20 (×3): qty 1

## 2014-10-20 MED ORDER — SODIUM CHLORIDE 0.9 % IV BOLUS (SEPSIS)
1000.0000 mL | Freq: Once | INTRAVENOUS | Status: AC
Start: 2014-10-20 — End: 2014-10-20
  Administered 2014-10-20: 1000 mL via INTRAVENOUS

## 2014-10-20 MED ORDER — ACETAMINOPHEN 325 MG PO TABS: 650.0000 mg | ORAL_TABLET | Freq: Four times a day (QID) | ORAL | Status: DC | PRN

## 2014-10-20 MED ORDER — SENNOSIDES 8.8 MG/5ML PO SYRP
30.0000 mL | ORAL_SOLUTION | Freq: Every day | ORAL | Status: DC
Start: 2014-10-20 — End: 2014-10-22
  Filled 2014-10-20 (×2): qty 30

## 2014-10-20 MED ORDER — HYDROCODONE-ACETAMINOPHEN 5-325 MG PO TABS
1.0000 | ORAL_TABLET | Freq: Four times a day (QID) | ORAL | Status: DC | PRN
  Administered 2014-10-21: 1 via ORAL
  Administered 2014-10-21: 2 via ORAL
  Filled 2014-10-20: qty 2
  Filled 2014-10-20: qty 1

## 2014-10-20 MED ORDER — SODIUM CHLORIDE 0.9 % IV SOLN
INTRAVENOUS | Status: DC
Start: 2014-10-20 — End: 2014-10-21
  Administered 2014-10-20: 23:00:00 via INTRAVENOUS

## 2014-10-20 MED ORDER — SODIUM CHLORIDE 0.9 % IV SOLN: INTRAVENOUS | Status: AC

## 2014-10-20 MED ORDER — ASPIRIN EC 81 MG PO TBEC
81.0000 mg | DELAYED_RELEASE_TABLET | Freq: Every day | ORAL | Status: DC
  Administered 2014-10-21 – 2014-10-22 (×2): 81 mg via ORAL
  Filled 2014-10-20 (×2): qty 1

## 2014-10-20 MED ORDER — ONDANSETRON HCL 4 MG PO TABS: 4.0000 mg | ORAL_TABLET | Freq: Four times a day (QID) | ORAL | Status: DC | PRN

## 2014-10-20 MED ORDER — DONEPEZIL HCL 10 MG PO TABS
10.0000 mg | ORAL_TABLET | Freq: Every day | ORAL | Status: DC
  Administered 2014-10-20 – 2014-10-21 (×2): 10 mg via ORAL
  Filled 2014-10-20 (×3): qty 1

## 2014-10-20 MED ORDER — LACTULOSE 10 GM/15ML PO SOLN
20.0000 g | Freq: Every day | ORAL | Status: DC | PRN
  Filled 2014-10-20: qty 30

## 2014-10-20 MED ORDER — ONDANSETRON HCL 4 MG/2ML IJ SOLN: 4.0000 mg | Freq: Four times a day (QID) | INTRAMUSCULAR | Status: DC | PRN

## 2014-10-20 MED ORDER — SODIUM CHLORIDE 0.9 % IV SOLN
INTRAVENOUS | Status: DC
  Administered 2014-10-20: 17:00:00 via INTRAVENOUS

## 2014-10-20 MED ORDER — ACETAMINOPHEN 650 MG RE SUPP: 650.0000 mg | Freq: Four times a day (QID) | RECTAL | Status: DC | PRN

## 2014-10-20 MED ORDER — RIVAROXABAN 15 MG PO TABS
15.0000 mg | ORAL_TABLET | Freq: Two times a day (BID) | ORAL | Status: DC
  Administered 2014-10-21 – 2014-10-22 (×3): 15 mg via ORAL
  Filled 2014-10-20 (×5): qty 1

## 2014-10-20 MED ORDER — IOHEXOL 300 MG/ML  SOLN
100.0000 mL | Freq: Once | INTRAMUSCULAR | Status: AC | PRN
  Administered 2014-10-20: 100 mL via INTRAVENOUS

## 2014-10-20 NOTE — Progress Notes (Signed)
Pt's right dosalis pedis and popliteal pulse heard via doppler.

## 2014-10-20 NOTE — ED Notes (Signed)
Per ems pt from [facility] health care center ( rehab), per ems per staff pt co constipation x 1 week, abdominal xray done and found soft tissue mass in lower abdomen. Pt is in ED for further evaluation . Hx lung cancer, Bilateral DVT. Per ems per staff pt is not taking meds as prescribed, pt is taking more per staff. Pt is alert and not ambulatory. BP 142/100, HR 111, O2 97 % RA

## 2014-10-20 NOTE — ED Provider Notes (Signed)
CSN: [id number]     Arrival date & time 10/20/14  1321 History   First MD Initiated Contact with Patient 10/20/14 1347     Chief Complaint  Patient presents with  . constipation/ soft tissue mass lower abdomen      (Consider location/radiation/quality/duration/timing/severity/associated sxs/prior Treatment) HPI Comments: Patient here complaining of abdominal discomfort with emesis 2 today as well as constipation. She is currently taking hydrocodone for a left ankle fracture dislocation. No fever reported. At the nursing home they have tried enemas as well as stool softeners without relief. Patient has had some abdominal distention. Her emesis has been nonbilious. No blood noted in stools. Symptoms persistent and nothing makes them better.  The history is provided by the patient and the spouse.    Past Medical History  Diagnosis Date  . Heart murmur   . Shortness of breath     hx lung ca   . Arthritis   . Lung cancer dx'd 2002  . Metastasis to brain dx'd 2008  . 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 Anal fistula   . H/O osteoporosis   . H/O vitamin D deficiency   . History of measles, mumps, or rubella   . H/O varicella   . Hypertension   . Yeast infection   . Abnormal Pap smear 2006  . Atrophic vaginitis 2008  . Dyspareunia 2008  . Dementia 2009  . Osteoporosis 2010  . Status post chemotherapy 2003    CARBOPLATIN/PACLITAXEL /STATUS POST CLINICAL TRAIL OF CELEBREX AND IRESSA AT [facility] FOR 1 YEAR  . Status post radiation therapy 2003    LEFT LUNG  . Status post radiation therapy 11/07/2005    WHOLE BRAIN: DR Larkin [facility]  . Status post radiation therapy 06/02/2008    GAMMA KNIFE OF RESECTED CAVITAY  . On antineoplastic chemotherapy     TARCEVA  . Nodule of right lung CT- 06/03/12    RIGHT UPPER LOBE  . Thyroid adenoma     ?  [name] Thyroid cancer 10/18/11 bx    adenoid nodules   . Anxiety   . Cataract   . Nodule of right lung 06/03/12    Upper Lobe  . Lung metastasis      PET scan 05/05/13, RUL lung nodule  . History of radiation therapy 07/28/13- 08/10/13    right lung metastasis 5000 cGy 10 sessions  . Lung cancer   . Lung cancer   . Ankle fracture    Past Surgical History  Procedure Laterality Date  . Left occipital craniotomy  09/21/2005    tumor  . Left lower lobectomy  05/2001  . Radical neck dissection  10/18/2011    Procedure: RADICAL NECK DISSECTION;  Surgeon: Izora Gala, MD;  Location: [facility];  Service: ENT;  Laterality: Right;  RIGHT MODIFIED NECK DISSECTION /POSSIBLE RIGHT THYROIDECTOM  . Thyroidectomy  10/18/2011    Procedure: THYROIDECTOMY WITH RADICAL NECK DISSECTION;  Surgeon: Izora Gala, MD;  Location: [facility];  Service: ENT;  Laterality: Right;  . Lung lobectomy    . Neck surgery    . Brain surgery    . Orif ankle fracture Left 10/02/2014    Procedure: OPEN REDUCTION INTERNAL FIXATION (ORIF) ANKLE FRACTURE;  Surgeon: Alta Corning, MD;  Location: [facility];  Service: Orthopedics;  Laterality: Left;   Family History  Problem Relation Age of Onset  . Gait disorder Mother   . Cancer Maternal Aunt     menigeoma  . Cancer Mother  Meningioma  . Cancer Father     Pancreatic  . Heart failure Sister    History  Substance Use Topics  . Smoking status: Never Smoker   . Smokeless tobacco: Never Used     Comment: smoked few years in college  . Alcohol Use: No   OB History    Gravida Para Term Preterm AB TAB SAB Ectopic Multiple Living   2 0 0 0 0 0 0 0  2     Review of Systems  All other systems reviewed and are negative.     Allergies  Review of patient's allergies indicates no known allergies.  Home Medications   Prior to Admission medications   Medication Sig Start Date End Date Taking? Authorizing Provider  aspirin EC 81 MG tablet Take 1 tablet (81 mg total) by mouth daily. 10/05/14  Yes Venetia Maxon Rama, MD  cholecalciferol (VITAMIN D) 1000 UNITS tablet Take 1,000 Units by mouth at bedtime.   Yes Historical Provider, MD   donepezil (ARICEPT) 10 MG tablet Take 10 mg by mouth at bedtime.   Yes Historical Provider, MD  Lactulose SOLN Take 30 mg by mouth daily.   Yes Historical Provider, MD  mirtazapine (REMERON) 15 MG tablet Take 15 mg by mouth at bedtime.   Yes Historical Provider, MD  Multiple Vitamin (MULITIVITAMIN WITH MINERALS) TABS Take 1 tablet by mouth daily.    Yes Historical Provider, MD  Naloxegol Oxalate 25 MG TABS Take 1 tablet by mouth daily.   Yes Historical Provider, MD  sennosides (SENOKOT) 8.8 MG/5ML syrup Take 30 mLs by mouth at bedtime.   Yes Historical Provider, MD  vitamin C (ASCORBIC ACID) 500 MG tablet Take 500 mg by mouth daily.    Yes Historical Provider, MD  alendronate (FOSAMAX) 70 MG tablet Take 1 tablet (70 mg total) by mouth every 7 (seven) days. Saturdays; Take with a full glass of water on an empty stomach. Patient taking differently: Take 70 mg by mouth at bedtime.  04/09/12   Eldred Manges, MD  Ascorbic Acid (VITAMIN C PO) Take 1 tablet by mouth at bedtime.     Historical Provider, MD  aspirin EC 81 MG tablet Take 81 mg by mouth at bedtime.     Historical Provider, MD  cephALEXin (KEFLEX) 500 MG capsule Take 1 capsule (500 mg total) by mouth 4 (four) times daily. 09/30/14   Fredia Sorrow, MD  Cholecalciferol (VITAMIN D3) 2000 UNITS TABS Take 2,000 Int'l Units by mouth at bedtime.     Historical Provider, MD  docusate sodium (COLACE) 100 MG capsule Take 1 capsule (100 mg total) by mouth 2 (two) times daily. 10/05/14   Christina P Rama, MD  donepezil (ARICEPT) 10 MG tablet Take 1 tablet (10 mg total) by mouth daily. 12/04/12   Asencion Partridge Dohmeier, MD  HYDROcodone-acetaminophen (NORCO) 5-325 MG per tablet Take 1-2 tablets by mouth every 6 (six) hours as needed for severe pain. 10/02/14   Erlene Senters, PA-C  HYDROcodone-acetaminophen (NORCO/VICODIN) 5-325 MG per tablet Take 1-2 tablets by mouth every 6 (six) hours as needed for moderate pain. 09/30/14   Fredia Sorrow, MD  ibuprofen  (ADVIL,MOTRIN) 200 MG tablet Take 400 mg by mouth every 6 (six) hours as needed for moderate pain (pain).    Historical Provider, MD  loperamide (IMODIUM) 2 MG capsule Take 2 mg by mouth 4 (four) times daily as needed for diarrhea or loose stools (diarrhea).     Historical Provider, MD  Multiple Vitamins-Minerals (  MULTIVITAMIN & MINERAL PO) Take 1 tablet by mouth at bedtime.    Historical Provider, MD  naphazoline-pheniramine (NAPHCON-A) 0.025-0.3 % ophthalmic solution Place 1 drop into both eyes 4 (four) times daily as needed. For dry eyes    Historical Provider, MD  NON FORMULARY Take 300 mg by mouth at bedtime. Take 3 Tablets (100 mg) By Mouth Every Evening. Study Medication for Lung Cancer called [id number].    Historical Provider, MD  polyethylene glycol (MIRALAX / GLYCOLAX) packet Take 17 g by mouth daily as needed for mild constipation. 10/05/14   Venetia Maxon Rama, MD  research study medication Take home supply. 10/05/14   Venetia Maxon Rama, MD  STUDY MEDICATION Take 3 tablets by mouth at bedtime. Lung Cancer Medication. Take 3 Tablets (300 mg) By Mouth at Bedtime.    Historical Provider, MD   BP 118/68 mmHg  Pulse 117  Temp(Src) 98.2 F (36.8 C) (Oral)  Resp 20  SpO2 95% Physical Exam  Constitutional: She is oriented to person, place, and time. She appears well-developed and well-nourished.  Non-toxic appearance. No distress.  HENT:  Head: Normocephalic and atraumatic.  Eyes: Conjunctivae, EOM and lids are normal. Pupils are equal, round, and reactive to light.  Neck: Normal range of motion. Neck supple. No tracheal deviation present. No thyroid mass present.  Cardiovascular: Normal rate, regular rhythm and normal heart sounds.  Exam reveals no gallop.   No murmur heard. Pulmonary/Chest: Effort normal and breath sounds normal. No stridor. No respiratory distress. She has no decreased breath sounds. She has no wheezes. She has no rhonchi. She has no rales.  Abdominal: Soft. Normal appearance  and bowel sounds are normal. She exhibits no distension. There is no tenderness. There is no rebound and no CVA tenderness.  Genitourinary:  Hard and soft stool noted in rectal vault with fecal impaction.  Musculoskeletal: Normal range of motion. She exhibits no edema or tenderness.  Neurological: She is alert and oriented to person, place, and time. No cranial nerve deficit. GCS eye subscore is 4. GCS verbal subscore is 5. GCS motor subscore is 6.  Patient at neurological baseline according to husband  Skin: Skin is warm and dry. No abrasion and no rash noted.  Psychiatric: She has a normal mood and affect. Her speech is normal and behavior is normal.  Nursing note and vitals reviewed.   ED Course  Procedures (including critical care time) Labs Review Labs Reviewed  CBC WITH DIFFERENTIAL/PLATELET  BASIC METABOLIC PANEL    Imaging Review No results found.   EKG Interpretation None      MDM   Final diagnoses:  None    Patiently manually disimpacted with copious amounts of stool.  Patient given IV fluids here heart rate is improved. No bilious emesis here. Results of acute abdominal series noted and patient will have abdominal CT to rule out obstruction. Care signed out to Dr. Les Pou, MD 10/20/14 [phone]

## 2014-10-20 NOTE — ED Provider Notes (Signed)
  Physical Exam  BP 134/86 mmHg  Pulse 105  Temp(Src) 97.9 F (36.6 C) (Oral)  Resp 23  SpO2 96%  Physical Exam  ED Course  Procedures  MDM Care assumed at sign out. 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 She is on pain meds and recently diagnosed with R foot cellulitis on keflex. Dr. Zenia Resides disimpacted patient and she had bowel movement. Xray showed possible SBO. Sign out pending CT. CT showed ileus. Also has urinary retention and L3, L4 fracture. Bladder scan showed >1000 cc. Foley placed by staff. Bilateral leg weakness on exam, no obvious saddle anesthesia. Retention likely from stool impaction, however, given L3,L4 fracture, I am concerned for neurogenic bladder. Will admit for ileus. Ordered MRI lumbar and hospitalist will f/u results.   Wandra Arthurs, MD 10/20/14 [phone]

## 2014-10-20 NOTE — Progress Notes (Signed)
Clinical Social Work Department BRIEF PSYCHOSOCIAL ASSESSMENT 10/20/2014  Patient:  Valerie [name], Valerie [name]     Account Number:  [id number]     Admit date:  10/20/2014  Clinical Social Worker:  Tilda Burrow, CLINICAL SOCIAL WORKER  Date/Time:  10/20/2014 11:12 AM  Referred by:  CSW  Date Referred:  10/20/2014 Referred for  Other - See comment   Other Referral:   Interview type:  Family Other interview type:    PSYCHOSOCIAL DATA Living Status:  FACILITY Admitted from facility:  [facility] Level of care:  [place] Primary support name:  [name] Primary support relationship to patient:  CHILD, ADULT Degree of support available:   Daughter appears to be supportive. Also, daughter states that when pt is not at facility she lives at home with her husband.    CURRENT CONCERNS Current Concerns  Adjustment to Illness   Other Concerns:    SOCIAL WORK ASSESSMENT / PLAN CSW attempted to meet with pt at bedside. However, nurse informed CSW that pt was in the process of being admitted to another floor.      CSW met with daughter in [place] conference room. Daughter confirms that pt presents to ED due to constipation. Daughter informed CSW that pt had ankle surgery on February 13 and has not had a bowel movement since.However, pt has been able to urinate.       Also, daughter states that pt has not been able to walk since the surgery.      Daughter appears to be a great support for pt. Daughter states that when pt is not at facility she lives at home with her husband.      Daughter states receives PT x2 a day at [facility].      Daughter states that she does not have any questions at this time. Daughter states that the pt currently has a bed at a facility/[facility].      CSW asked daughter if she had any questions. However, daughter states that she does not have any questions.      Daughter/[name] Williams [phone]   Husband/Maurice [phone]      Willette Brace [phone] ED CSW 10/20/2014 9:58 PM                               Assessment/plan status:  No Further Intervention Required Other assessment/ plan:   Information/referral to community resources:   None. Daughter states that the pt is currently at a facility and is welcomed back. Daughter infomred CSW that pt's belongings are still currently at the facility.    PATIENT'S/FAMILY'S RESPONSE TO PLAN OF CARE: CSW was not able to speak with pt. Nurse states pt was in route to be admited onto a medical floor. However, CSW with daughter who states that the pt is currently at a facility for rehab.

## 2014-10-20 NOTE — ED Notes (Signed)
Bed: WA08 Expected date:  Expected time:  Means of arrival:  Comments: ems 

## 2014-10-20 NOTE — Progress Notes (Signed)
CSW attempted to meet with pt at bedside. However, nurse informed CSW that pt was in the process of being admitted to another floor.  CSW met with daughter in [place] conference room. Daughter confirms that pt presents to ED due to constipation. Daughter informed CSW that pt had ankle surgery on February 13 and has not had a bowel movement since.   Daughter appears to be a great support for pt. Daughter states that when pt is not at facility she lives at home with her husband.  Daughter states receives PT x2 a day at [facility].  Daughter states that she does not have any questions at this time.  Daughter/[name] Williams [phone] Husband/Maurice [phone]  Willette Brace [phone] ED CSW 10/20/2014 9:58 PM

## 2014-10-20 NOTE — ED Notes (Signed)
Patient still in CT.

## 2014-10-20 NOTE — H&P (Signed)
[facility] History and Physical  Valerie [name]:[id number] DOB: May 23, 1946 DOA: 10/20/2014  Referring physician: ER physician. PCP: [name], MD   Chief Complaint: Not moving bowels.  HPI: Valerie [name] is a 69 y.o. female with history of lung cancer who was recently admitted for fracture of the left ankle after a mechanical fall and had ORIF was brought from the rehabilitation after patient was having difficulty moving bowels over the last few days. Patient also had one episode of nausea vomiting. Abdominal x-ray showed which is concerning for ileus and CT abdomen was done which also shows features concerning for ileus. Patient had stool disimpaction done in the ER. Prior to arrival as per the patient's daughter patient had received an enema at the nursing home. In addition patient is found to have a right ankle erythema for which patient has been receiving oral antibiotics recently at the rehabilitation. Patient was just recently started on xarelto for bilateral lower extremity DVT 3 days ago. Patient has been admitted for further management.   Review of Systems: As presented in the history of presenting illness, rest negative.  Past Medical History  Diagnosis Date  . Heart murmur   . Shortness of breath     hx lung ca   . Arthritis   . Lung cancer dx'd 2002  . Metastasis to brain dx'd 2008  . 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 Anal fistula   . H/O osteoporosis   . H/O vitamin D deficiency   . History of measles, mumps, or rubella   . H/O varicella   . Hypertension   . Yeast infection   . Abnormal Pap smear 2006  . Atrophic vaginitis 2008  . Dyspareunia 2008  . Dementia 2009  . Osteoporosis 2010  . Status post chemotherapy 2003    CARBOPLATIN/PACLITAXEL /STATUS POST CLINICAL TRAIL OF CELEBREX AND IRESSA AT [facility] FOR 1 YEAR  . Status post radiation therapy 2003    LEFT LUNG  . Status post radiation therapy 11/07/2005    WHOLE BRAIN: DR  Larkin [facility]  . Status post radiation therapy 06/02/2008    GAMMA KNIFE OF RESECTED CAVITAY  . On antineoplastic chemotherapy     TARCEVA  . Nodule of right lung CT- 06/03/12    RIGHT UPPER LOBE  . Thyroid adenoma     ?  [name] Thyroid cancer 10/18/11 bx    adenoid nodules   . Anxiety   . Cataract   . Nodule of right lung 06/03/12    Upper Lobe  . Lung metastasis     PET scan 05/05/13, RUL lung nodule  . History of radiation therapy 07/28/13- 08/10/13    right lung metastasis 5000 cGy 10 sessions  . Lung cancer   . Lung cancer   . Ankle fracture    Past Surgical History  Procedure Laterality Date  . Left occipital craniotomy  09/21/2005    tumor  . Left lower lobectomy  05/2001  . Radical neck dissection  10/18/2011    Procedure: RADICAL NECK DISSECTION;  Surgeon: Izora Gala, MD;  Location: [place];  Service: ENT;  Laterality: Right;  RIGHT MODIFIED NECK DISSECTION /POSSIBLE RIGHT THYROIDECTOM  . Thyroidectomy  10/18/2011    Procedure: THYROIDECTOMY WITH RADICAL NECK DISSECTION;  Surgeon: Izora Gala, MD;  Location: [place];  Service: ENT;  Laterality: Right;  . Lung lobectomy    . Neck surgery    . Brain surgery    . Orif ankle fracture Left  10/02/2014    Procedure: OPEN REDUCTION INTERNAL FIXATION (ORIF) ANKLE FRACTURE;  Surgeon: Alta Corning, MD;  Location: [facility];  Service: Orthopedics;  Laterality: Left;   Social History:  reports that she has never smoked. She has never used smokeless tobacco. She reports that she does not drink alcohol or use illicit drugs. Where does patient live rehabilitation. Can patient participate in ADLs? No.  No Known Allergies  Family History:  Family History  Problem Relation Age of Onset  . Gait disorder Mother   . Cancer Maternal Aunt     menigeoma  . Cancer Mother     Meningioma  . Cancer Father     Pancreatic  . Heart failure Sister       Prior to Admission medications   Medication Sig Start Date End Date Taking? Authorizing Provider   aspirin EC 81 MG tablet Take 81 mg by mouth at bedtime.    Yes Historical Provider, MD  aspirin EC 81 MG tablet Take 1 tablet (81 mg total) by mouth daily. 10/05/14  Yes Christina P Rama, MD  bisacodyl (DULCOLAX) 10 MG suppository Place 10 mg rectally daily as needed for moderate constipation.   Yes Historical Provider, MD  cholecalciferol (VITAMIN D) 1000 UNITS tablet Take 1,000 Units by mouth at bedtime.   Yes Historical Provider, MD  docusate sodium (COLACE) 100 MG capsule Take 1 capsule (100 mg total) by mouth 2 (two) times daily. 10/05/14  Yes Christina P Rama, MD  donepezil (ARICEPT) 10 MG tablet Take 1 tablet (10 mg total) by mouth daily. Patient taking differently: Take 10 mg by mouth at bedtime.  12/04/12  Yes Carmen Dohmeier, MD  donepezil (ARICEPT) 10 MG tablet Take 10 mg by mouth at bedtime.   Yes Historical Provider, MD  HYDROcodone-acetaminophen (NORCO) 5-325 MG per tablet Take 1-2 tablets by mouth every 6 (six) hours as needed for severe pain. 10/02/14  Yes Erlene Senters, PA-C  indomethacin (INDOCIN) 50 MG capsule Take 50 mg by mouth every 8 (eight) hours as needed for mild pain or moderate pain.   Yes Historical Provider, MD  lactulose (CEPHULAC) 20 G packet Take 20 g by mouth daily as needed (constipation).   Yes Historical Provider, MD  Lactulose SOLN Take 30 mg by mouth daily.   Yes Historical Provider, MD  mirtazapine (REMERON) 15 MG tablet Take 15 mg by mouth at bedtime.   Yes Historical Provider, MD  Multiple Vitamin (MULITIVITAMIN WITH MINERALS) TABS Take 1 tablet by mouth daily.    Yes Historical Provider, MD  Naloxegol Oxalate 25 MG TABS Take 1 tablet by mouth daily.   Yes Historical Provider, MD  Promethazine HCl (PHENERGAN IJ) Inject 12.5 mg into the muscle every 6 (six) hours as needed (nausea and vomiting). 10/20/14 10/20/14 Yes Historical Provider, MD  Rivaroxaban (XARELTO) 15 MG TABS tablet Take 15 mg by mouth 2 (two) times daily with a meal.   Yes Historical Provider, MD   sennosides (SENOKOT) 8.8 MG/5ML syrup Take 30 mLs by mouth at bedtime.   Yes Historical Provider, MD  vitamin C (ASCORBIC ACID) 500 MG tablet Take 500 mg by mouth daily.    Yes Historical Provider, MD  alendronate (FOSAMAX) 70 MG tablet Take 1 tablet (70 mg total) by mouth every 7 (seven) days. Saturdays; Take with a full glass of water on an empty stomach. Patient not taking: Reported on 10/20/2014 04/09/12   Eldred Manges, MD  cephALEXin (KEFLEX) 500 MG capsule Take 1 capsule (  500 mg total) by mouth 4 (four) times daily. Patient not taking: Reported on 10/20/2014 09/30/14   Fredia Sorrow, MD  cephALEXin (KEFLEX) 500 MG capsule Take 500 mg by mouth 3 (three) times daily.    Historical Provider, MD  HYDROcodone-acetaminophen (NORCO/VICODIN) 5-325 MG per tablet Take 1-2 tablets by mouth every 6 (six) hours as needed for moderate pain. Patient not taking: Reported on 10/20/2014 09/30/14   Fredia Sorrow, MD  polyethylene glycol [facility] / Floria Raveling) packet Take 17 g by mouth daily as needed for mild constipation. 10/05/14   Venetia Maxon Rama, MD  research study medication Take home supply. Patient not taking: Reported on 10/20/2014 10/05/14   Venetia Maxon Rama, MD    Physical Exam: Filed Vitals:   10/20/14 1555 10/20/14 1930 10/20/14 2020 10/20/14 2120  BP: 134/86 129/86 128/66 122/63  Pulse: 105  98 104  Temp: 97.9 F (36.6 C)   98 F (36.7 C)  TempSrc: Oral   Oral  Resp: 23 24 18 20   Height:    5\' 8"  (1.727 m)  Weight:    96.934 kg (213 lb 11.2 oz)  SpO2: 96%  98% 99%     General:  Moderately built and nourished.  Eyes: Anicteric no pallor.  ENT: No discharge from the ears eyes nose or mouth.  Neck: No mass felt.  Cardiovascular: S1-S2 heard.  Respiratory: No rhonchi or crepitations.  Abdomen: Soft nontender bowel sounds present.  Skin: Erythema in the right ankle area.  Musculoskeletal: Edema in the right lower extremity. Left lower extremity has dressing.  Psychiatric:  Appears normal.  Neurologic: Alert awake oriented to name and place. Moves all extremities.  Labs on Admission:  Basic Metabolic Panel:  Recent Labs Lab 10/20/14 1434  NA 130*  K 4.0  CL 96  CO2 24  GLUCOSE 137*  BUN 26*  CREATININE 0.71  CALCIUM 9.4   Liver Function Tests: No results for input(s): AST, ALT, ALKPHOS, BILITOT, PROT, ALBUMIN in the last 168 hours. No results for input(s): LIPASE, AMYLASE in the last 168 hours. No results for input(s): AMMONIA in the last 168 hours. CBC:  Recent Labs Lab 10/20/14 1434  WBC 15.6*  NEUTROABS 14.2*  HGB 12.2  HCT 37.4  MCV 89.0  PLT 399   Cardiac Enzymes: No results for input(s): CKTOTAL, CKMB, CKMBINDEX, TROPONINI in the last 168 hours.  BNP (last 3 results) No results for input(s): BNP in the last 8760 hours.  ProBNP (last 3 results) No results for input(s): PROBNP in the last 8760 hours.  CBG: No results for input(s): GLUCAP in the last 168 hours.  Radiological Exams on Admission: Mr Lumbar Spine Wo Contrast  10/20/2014   CLINICAL DATA:  [address] in [employer] parking lot October 08, 2014 injuring lower back. History of L3 and L4 fracture. History of metastatic lung cancer. Recent bowel impaction, urinary bladder retention, lower extremity weakness, and clinical concern for neurogenic bladder.  EXAM: MRI LUMBAR SPINE WITHOUT CONTRAST  TECHNIQUE: Multiplanar, multisequence MR imaging of the lumbar spine was performed. No intravenous contrast was administered.  COMPARISON:  CT of the abdomen and pelvis October 20, 2014 at 1826 hours and CT of the abdomen and pelvis January 20, 2014  FINDINGS: Using the reference level of the last well-formed intervertebral disc as L5-S1, moderate L4 vertebral body compression fracture with approximately 50% height loss, mild to moderate L5 compression fracture with approximately 20% height loss. These are new from June 2015 though, no STIR signal abnormality to  suggest acute fracture. No  malalignment. Remaining lumbar vertebral bodies intact. Moderate to severe L5-S1 disc height loss, remaining disc morphology is maintained, decreased T2 signal within the L2-3 through L5-S1 disc consistent with mild desiccation. Mild chronic discogenic endplate changes O6-7 and L4-5, mild subacute to chronic discogenic endplate change at T2-W5.  Conus medullaris terminates at T12-L1 and appears normal in morphology and signal characteristics, partially imaged. Cauda equina is unremarkable. Moderate to severe paraspinal muscle atrophy, with bright interstitial STIR signal suggesting a component denervation. Large amount of stool in the large bowel partially imaged, with mild hydronephrosis bilaterally, incompletely assessed.  Level by level evaluation:  T12-L1, L1-2: No disc bulge, canal stenosis nor neural foraminal narrowing.  L2-3: Small LEFT extra foraminal disc protrusion encroaches upon the exited LEFT L2 nerve. Mild facet arthropathy and ligamentum flavum redundancy. Mild canal stenosis. Mild LEFT neural foraminal narrowing. Tiny LEFT facet synovial cyst extending into the paraspinal soft tissues.  L3-4: 3 mm broad-based disc bulge, moderate facet arthropathy and ligamentum flavum redundancy with trace facet effusions which are likely reactive. Moderate to severe canal stenosis and trefoil configuration with partial effacement lateral recesses which may affect the traversing L4 nerve. Mild LEFT neural foraminal narrowing. Bilateral LEFT greater than RIGHT subcentimeter synovial facet cysts extend into the paraspinal soft tissues.  L4-5: 3 mm broad-based disc bulge, central annular tear. Mild facet arthropathy and ligamentum flavum redundancy. Mild canal stenosis. Mild bilateral neural foraminal narrowing.  L5-S1: 2 mm annular bulging, mild facet arthropathy and ligamentum flavum redundancy without canal stenosis. Mild RIGHT, mild to moderate LEFT neural foraminal narrowing.  IMPRESSION: Remote moderate L4 and  mild to moderate L5 compression fractures. No acute fracture or malalignment.  Degenerative change of the lumbar spine resulting in moderate to severe canal stenosis at L3-4, mild at L2-3 and L3-4.  Neural foraminal narrowing L2-3 through L5-S1: Mild to moderate on the LEFT at L5-S1.  Moderate to severe paraspinal muscle atrophy with a component of suspected denervation.   Electronically Signed   By: Elon Alas   On: 10/20/2014 22:16   Ct Abdomen Pelvis W Contrast  10/20/2014   CLINICAL DATA:  Abdominal pain and distention L1-2 weeks. History of lung cancer.  EXAM: CT ABDOMEN AND PELVIS WITH CONTRAST  TECHNIQUE: Multidetector CT imaging of the abdomen and pelvis was performed using the standard protocol following bolus administration of intravenous contrast.  CONTRAST:  100 mL OMNIPAQUE IOHEXOL 300 MG/ML  SOLN  COMPARISON:  CT chest, abdomen and pelvis 01/20/2014.  FINDINGS: The patient has small bilateral pleural effusions, larger on the left. Small focus of airspace opacity is seen in the left lung base. 0.9 cm nodule in the left lower lobe seen on the comparison examination is not visible on this study and may be obscured. No pleural effusion is identified.  There is massive distention of the urinary bladder and moderate bilateral hydronephrosis. These findings are new since the comparison CT scan. Question bladder outlet obstruction or neurogenic bladder. Cause for this abnormality is not identified.  The ascending and transverse colon are markedly distended. Liquid stool is seen in the descending colon. In the sigmoid colon, the wall appears somewhat thickened although this is likely due to fluid surrounding stool rather than true wall thickening.  The gallbladder, liver, spleen, adrenal glands and pancreas appear normal. Small bowel loops are normal in appearance. Subcutaneous edema is present about the pelvis and upper legs bilaterally. No lymphadenopathy is identified.  Since the prior CT scan, the  patient has suffered L4 and L5 superior endplate compression fractures. No lytic or sclerotic lesion association with the fractures is identified. Defect in left lower ribs consistent with postoperative change is noted.  IMPRESSION: Massive distention of the urinary bladder and associated moderate bilateral hydronephrosis. Cause for this abnormality is not identified. Question bladder outlet obstruction or neurogenic bladder. This finding is new since the comparison CT scan.  Dilatation of the colon with liquid stool in the descending and sigmoid. No obstruction is identified and dilatation is likely due to colonic ileus.  Small bilateral pleural effusions, larger on the left. Incomplete visualization of airspace opacity in left lung base may reflect atelectasis. Left lower lobe pulmonary nodule seen on the prior chest CT is not visible on this exam and may be obscured.  L4 and L5 superior endplate compression fractures are new since the prior CT. No lytic or sclerotic lesion associated show with the fractures is identified.   Electronically Signed   By: Inge Rise M.D.   On: 10/20/2014 18:59   Dg Abd Acute W/chest  10/20/2014   CLINICAL DATA:  Soft tissue mass in the lower abdomen on physical exam. Initial encounter.  EXAM: ACUTE ABDOMEN SERIES (ABDOMEN 2 VIEW & CHEST 1 VIEW)  COMPARISON:  CT abdomen 01/20/2014.  FINDINGS: There is no out acute cardiopulmonary disease. Marked gaseous distension of bowel is present with elevation of the LEFT hemidiaphragm. Although elevation of the LEFT hemidiaphragm has been a chronic finding, this is more pronounced on today's exam. Lung volumes are also lower than on previous studies. Monitoring leads project over the chest. Postoperative changes are present in the LEFT upper lobe and some of this accounts for volume loss and elevation of the LEFT hemidiaphragm.  There is no free air under the hemidiaphragms on semi upright imaging. There is gaseous distension of: , with  multiple air-fluid levels. Decubitus images were obtained and there is no intra-abdominal free air. Stool is present in the rectosigmoid. Mild dilation of small bowel loops measuring up to 3.3 cm. The cecum measures 10.7 cm and is distended with gas. Cecal position appears within normal limits.  IMPRESSION: 1. Postsurgical changes in the LEFT upper lobe with elevation of the LEFT hemidiaphragm. 2. Small and large bowel dilation with marked gaseous distension and air-fluid levels. The appearance is nonspecific and with both large in small bowel dilation, the findings suggest ileus. Colonic obstruction is considered less likely. 3. No intra-abdominal free air.   Electronically Signed   By: Dereck Ligas M.D.   On: 10/20/2014 15:37     Assessment/Plan Principal Problem:   Ileus Active Problems:   Urinary retention   Cellulitis of leg, right   DVT, lower extremity   1. Ileus - may be related to patient's pain medications. Closely follow metabolic panel. In the ER patient has stool disimpaction for which patient has had a bowel movement. Patient also did receive an enema in the rehabilitation. Closely observe. 2. Right ankle erythema suspecting cellulitis - patient be placed on vancomycin at this time. Check sedimentation rate and also MRI of the ankle to rule out osteomyelitis. 3. Bilateral lower extremity DVT recently diagnosed - on xarelto. 4. Urinary retention - patient was found to have urinary retention in the ER and was placed a Foley catheter following which patient has had good urine output. Follow metabolic panel. 5. Lung cancer on trial drug - continue present medication. 6. History of dementia - continue present medications.  Patient's CT scan of the  abdomen or showing L4-L5 endplate fracture for which an MRI has been ordered and is pending.   DVT Prophylaxis on xarelto.  Code Status: Full code.  Family Communication: Patient's daughter at the bedside.  Disposition Plan: Admit to  inpatient.    KAKRAKANDY,ARSHAD N. [facility] Pager [phone].  If 7PM-7AM, please contact night-coverage [email or web address] Password [email or web address] 10/20/2014, 10:40 PM

## 2014-10-21 ENCOUNTER — Inpatient Hospital Stay (HOSPITAL_COMMUNITY): Payer: Medicare Other

## 2014-10-21 LAB — CBC WITH DIFFERENTIAL/PLATELET
Basophils Absolute: 0 10*3/uL (ref 0.0–0.1)
Basophils Relative: 0 % (ref 0–1)
Eosinophils Absolute: 0.2 10*3/uL (ref 0.0–0.7)
Eosinophils Relative: 2 % (ref 0–5)
HCT: 30.3 % — ABNORMAL LOW (ref 36.0–46.0)
Hemoglobin: 9.7 g/dL — ABNORMAL LOW (ref 12.0–15.0)
Lymphocytes Relative: 11 % — ABNORMAL LOW (ref 12–46)
Lymphs Abs: 1 10*3/uL (ref 0.7–4.0)
MCH: 28.8 pg (ref 26.0–34.0)
MCHC: 32 g/dL (ref 30.0–36.0)
MCV: 89.9 fL (ref 78.0–100.0)
Monocytes Absolute: 0.8 10*3/uL (ref 0.1–1.0)
Monocytes Relative: 9 % (ref 3–12)
Neutro Abs: 7 10*3/uL (ref 1.7–7.7)
Neutrophils Relative %: 79 % — ABNORMAL HIGH (ref 43–77)
Platelets: 275 10*3/uL (ref 150–400)
RBC: 3.37 MIL/uL — ABNORMAL LOW (ref 3.87–5.11)
RDW: 14 % (ref 11.5–15.5)
WBC: 8.9 10*3/uL (ref 4.0–10.5)

## 2014-10-21 LAB — COMPREHENSIVE METABOLIC PANEL
ALT: 24 U/L (ref 0–35)
AST: 22 U/L (ref 0–37)
Albumin: 2.5 g/dL — ABNORMAL LOW (ref 3.5–5.2)
Alkaline Phosphatase: 72 U/L (ref 39–117)
Anion gap: 5 (ref 5–15)
BUN: 18 mg/dL (ref 6–23)
CO2: 28 mmol/L (ref 19–32)
Calcium: 8.7 mg/dL (ref 8.4–10.5)
Chloride: 102 mmol/L (ref 96–112)
Creatinine, Ser: 0.55 mg/dL (ref 0.50–1.10)
GFR calc Af Amer: >90 mL/min (ref >90)
GFR calc non Af Amer: >90 mL/min (ref >90)
Glucose, Bld: 92 mg/dL (ref 70–99)
Potassium: 3.3 mmol/L — ABNORMAL LOW (ref 3.5–5.1)
Sodium: 135 mmol/L (ref 135–145)
Total Bilirubin: 0.5 mg/dL (ref 0.3–1.2)
Total Protein: 5.2 g/dL — ABNORMAL LOW (ref 6.0–8.3)

## 2014-10-21 LAB — SEDIMENTATION RATE: Sed Rate: 20 mm/hr (ref 0–22)

## 2014-10-21 LAB — URIC ACID: Uric Acid, Serum: 4.3 mg/dL (ref 2.4–7.0)

## 2014-10-21 MED ORDER — DOXYCYCLINE HYCLATE 100 MG PO TABS
100.0000 mg | ORAL_TABLET | Freq: Two times a day (BID) | ORAL | Status: DC
  Administered 2014-10-21 – 2014-10-22 (×3): 100 mg via ORAL
  Filled 2014-10-21 (×4): qty 1

## 2014-10-21 MED ORDER — STUDY - INVESTIGATIONAL MEDICATION
3.0000 | Freq: Every day | Status: DC
  Administered 2014-10-21: 3 via ORAL

## 2014-10-21 NOTE — Progress Notes (Signed)
Valerie [name] JGG:[id number] DOB: June 21, 1946 DOA: 10/20/2014 PCP: [name], MD  Brief narrative: 69 y/o ? h/o Met NSCLC + Brain Lung Ca  05/2001 [s/p lobectomy] with progression while onTarceva [EGFR + T790M mutations] on Exp [id number] via [facility] Dr. Aniceto Boss, recent Fall c Trimalleolar fracture status post ORIF 10/02/58, dementia?, Reactive airway disease admitted from home with significant obstipation constipation-disimpaction occurred in the emergency room with regular stools being passed subsequently  Past medical history-As per Problem list Chart reviewed as below- Reviewed  Consultants:  None  Procedures:  None  Antibiotics:  Vancomycin 3/2  Doxycycline 3/3--   Subjective  Feels fair  passing regular stools No nausea no vomiting Tolerating diet No significant pain in lower extremities have right lower extremity still red his nonweightbearing on left side   Objective    Interim History:   Telemetry: None telemetry    Objective: Filed Vitals:   10/20/14 1555 10/20/14 1930 10/20/14 2020 10/20/14 2120  BP: 134/86 129/86 128/66 122/63  Pulse: 105  98 104  Temp: 97.9 F (36.6 C)   98 F (36.7 C)  TempSrc: Oral   Oral  Resp: $Remo'23 24 18 20  'ltTKF$ Height:    '5\' 8"'$  (1.727 m)  Weight:    96.934 kg (213 lb 11.2 oz)  SpO2: 96%  98% 99%    Intake/Output Summary (Last 24 hours) at 10/21/14 1323 Last data filed at 10/21/14 0600  Gross per 24 hour  Intake  362.5 ml  Output   5550 ml  Net -5187.5 ml    Exam:  General: Third pleasant oriented no apparent distress  Cardiovascular: S1-S2 no murmur rub or gallop  Respiratory: Clinically clear no added sound  Abdomen: Soft non-tender nondistended  Skinno lower extremity edema on the left however right side appears to erythema over the lateral malleolus which has decreased  Neurointact  Data Reviewed: Basic Metabolic Panel:  Recent Labs Lab 10/20/14 1434 10/21/14 0610  NA 130* 135  K 4.0 3.3*  CL  96 102  CO2 24 28  GLUCOSE 137* 92  BUN 26* 18  CREATININE 0.71 0.55  CALCIUM 9.4 8.7   Liver Function Tests:  Recent Labs Lab 10/21/14 0610  AST 22  ALT 24  ALKPHOS 72  BILITOT 0.5  PROT 5.2*  ALBUMIN 2.5*   No results for input(s): LIPASE, AMYLASE in the last 168 hours. No results for input(s): AMMONIA in the last 168 hours. CBC:  Recent Labs Lab 10/20/14 1434 10/21/14 0610  WBC 15.6* 8.9  NEUTROABS 14.2* 7.0  HGB 12.2 9.7*  HCT 37.4 30.3*  MCV 89.0 89.9  PLT 399 275   Cardiac Enzymes: No results for input(s): CKTOTAL, CKMB, CKMBINDEX, TROPONINI in the last 168 hours. BNP: Invalid input(s): POCBNP CBG: No results for input(s): GLUCAP in the last 168 hours.  No results found for this or any previous visit (from the past 240 hour(s)).   Studies:              All Imaging reviewed and is as per above notation   Scheduled Meds: . aspirin EC  81 mg Oral Daily  . donepezil  10 mg Oral QHS  . doxycycline  100 mg Oral Q12H  . mirtazapine  15 mg Oral QHS  . Naloxegol Oxalate  1 tablet Oral Daily  . Rivaroxaban  15 mg Oral BID WC  . sennosides  30 mL Oral QHS  . STUDY MEDICATION -  [place] [id number]  3 capsule Oral QHS  Continuous Infusions: . sodium chloride 50 mL/hr at 10/20/14 2245     Assessment/Plan: 1. Ileus/obstipation-resolved after enema. Continue scheduled Dulcolax if needed, continue lactulose 20 grams when necessary if needed 2. Metastatic non-small cell lung cancer status post lobectomy + progression now on study medication by [facility]-we will alert Dr. Earlie Server to patient's presence here.  Patient is to continue the study medication while here 3. Recent trimalleolar fracture 09/2014-PT OT to assess patient with recommendations 4. Bilateral DVT diagnosis 10/18/14-started on Zaroxolyn-needs to be tapered to regular dosing 20 mg in about 15-20 days. 5. Cellulitis RLE- Vanc-->doxycycline 100 bid.  Clinically revealed in the morning  Code Status: Full   Family Communication: Discussed with husband who is a physician and updated fully  Disposition Plan: Likely discharge a.m. 0  Verneita Griffes, MD  [facility] Pager [phone] 10/21/2014, 1:23 PM    LOS: 1 day

## 2014-10-21 NOTE — Clinical Documentation Improvement (Signed)
Sodium as below.  0.9% solium chloride 1000cc bolus ordered. Please identify any clinical conditions associated with the abnormal sodium level and document in your progress note and discharge summary.    Component Sodium  Latest Ref Rng 135 - 145 mmol/L  10/20/2014 130 (L)  10/21/2014 135   Possible Clinical Conditions: -Hyponatremia -Other condition (please specify) -Unable to determine  Thank you, Mateo Flow, RN [phone] Clinical Documentation Specialist

## 2014-10-21 NOTE — Progress Notes (Signed)
CSW met with Pt to discuss family support and placement at [facility]. Pt confirms that she is a resident there and that her family is involved and would be visiting this afternoon. Pt declines current family contact.  CSW spoke with [facility] who confirmed that Pt is a resident there and can return upon discharge.   CSW text paged MD to request PT/OT orders for insurance authorization.  Peri Maris, LCSWA 10/21/2014 1:25 PM

## 2014-10-21 NOTE — Progress Notes (Signed)
ANTIBIOTIC CONSULT NOTE - INITIAL  Pharmacy Consult for Vancomycin Indication: Cellulitis  No Known Allergies  Patient Measurements: Height: 5\' 8"  (172.7 cm) Weight: 213 lb 11.2 oz (96.934 kg) IBW/kg (Calculated) : 63.9 Adjusted Body Weight:   Vital Signs: Temp: 98 F (36.7 C) (03/02 2120) Temp Source: Oral (03/02 2120) BP: 122/63 mmHg (03/02 2120) Pulse Rate: 104 (03/02 2120) Intake/Output from previous day: 03/02 0701 - 03/03 0700 In: 162.5 [I.V.:162.5] Out: 4100 [Urine:4100] Intake/Output from this shift: Total I/O In: 162.5 [I.V.:162.5] Out: 3400 [Urine:3400]  Labs:  Recent Labs  10/20/14 1434  WBC 15.6*  HGB 12.2  PLT 399  CREATININE 0.71   Estimated Creatinine Clearance: 80.8 mL/min (by C-G formula based on Cr of 0.71). No results for input(s): VANCOTROUGH, VANCOPEAK, VANCORANDOM, GENTTROUGH, GENTPEAK, GENTRANDOM, TOBRATROUGH, TOBRAPEAK, TOBRARND, AMIKACINPEAK, AMIKACINTROU, AMIKACIN in the last 72 hours.   Microbiology: Recent Results (from the past 720 hour(s))  Urine culture     Status: None   Collection Time: 09/30/14  4:12 PM  Result Value Ref Range Status   Specimen Description URINE, CLEAN CATCH  Final   Special Requests NONE  Final   Colony Count   Final    >=100,000 COLONIES/ML Performed at [facility]    Culture   Final    ESCHERICHIA COLI Performed at [facility]    Report Status 10/03/2014 FINAL  Final   Organism ID, Bacteria ESCHERICHIA COLI  Final      Susceptibility   Escherichia coli - MIC*    AMPICILLIN 8 SENSITIVE Sensitive     CEFAZOLIN <=4 SENSITIVE Sensitive     CEFTRIAXONE <=1 SENSITIVE Sensitive     CIPROFLOXACIN <=0.25 SENSITIVE Sensitive     GENTAMICIN <=1 SENSITIVE Sensitive     LEVOFLOXACIN <=0.12 SENSITIVE Sensitive     NITROFURANTOIN <=16 SENSITIVE Sensitive     TOBRAMYCIN <=1 SENSITIVE Sensitive     TRIMETH/SULFA <=20 SENSITIVE Sensitive     PIP/TAZO <=4 SENSITIVE Sensitive     * ESCHERICHIA  COLI  Surgical pcr screen     Status: Abnormal   Collection Time: 10/02/14  6:30 AM  Result Value Ref Range Status   MRSA, PCR NEGATIVE NEGATIVE Final   Staphylococcus aureus POSITIVE (A) NEGATIVE Final    Comment:        The Xpert SA Assay (FDA approved for NASAL specimens in patients over 5 years of age), is one component of a comprehensive surveillance program.  Test performance has been validated by [facility] for patients greater than or equal to 38 year old. It is not intended to diagnose infection nor to guide or monitor treatment.     Medical History: Past Medical History  Diagnosis Date  . Heart murmur   . Shortness of breath     hx lung ca   . Arthritis   . Lung cancer dx'd 2002  . Metastasis to brain dx'd 2008  . 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 Anal fistula   . H/O osteoporosis   . H/O vitamin D deficiency   . History of measles, mumps, or rubella   . H/O varicella   . Hypertension   . Yeast infection   . Abnormal Pap smear 2006  . Atrophic vaginitis 2008  . Dyspareunia 2008  . Dementia 2009  . Osteoporosis 2010  . Status post chemotherapy 2003    CARBOPLATIN/PACLITAXEL /STATUS POST CLINICAL TRAIL OF CELEBREX AND IRESSA AT [facility] FOR 1 YEAR  . Status post radiation therapy  2003    LEFT LUNG  . Status post radiation therapy 11/07/2005    WHOLE BRAIN: DR Larkin [facility]  . Status post radiation therapy 06/02/2008    GAMMA KNIFE OF RESECTED CAVITAY  . On antineoplastic chemotherapy     TARCEVA  . Nodule of right lung CT- 06/03/12    RIGHT UPPER LOBE  . Thyroid adenoma     ?  [name] Thyroid cancer 10/18/11 bx    adenoid nodules   . Anxiety   . Cataract   . Nodule of right lung 06/03/12    Upper Lobe  . Lung metastasis     PET scan 05/05/13, RUL lung nodule  . History of radiation therapy 07/28/13- 08/10/13    right lung metastasis 5000 cGy 10 sessions  . Lung cancer   . Lung cancer   . Ankle fracture     Medications:  Anti-infectives     Start     Dose/Rate Route Frequency Ordered Stop   10/20/14 2300  vancomycin (VANCOCIN) 1,250 mg in sodium chloride 0.9 % 250 mL IVPB     1,250 mg 166.7 mL/hr over 90 Minutes Intravenous 2 times daily 10/20/14 2246       Assessment: Patient admit for ileus and MD wishes pharmacy to dose vancomycin for cellulitis/ r/o osteomyelitis.    Goal of Therapy:  Vancomycin trough level 10-15 mcg/ml   Plan:  Measure antibiotic drug levels at steady state Follow up culture results Vancomycin 1250mg  iv q12hr  Nani Skillern Crowford 10/21/2014,5:44 AM

## 2014-10-22 DIAGNOSIS — R338 Other retention of urine: Secondary | ICD-10-CM | POA: Diagnosis not present

## 2014-10-22 DIAGNOSIS — R319 Hematuria, unspecified: Secondary | ICD-10-CM | POA: Diagnosis not present

## 2014-10-22 DIAGNOSIS — M79604 Pain in right leg: Secondary | ICD-10-CM | POA: Diagnosis not present

## 2014-10-22 DIAGNOSIS — M810 Age-related osteoporosis without current pathological fracture: Secondary | ICD-10-CM | POA: Diagnosis not present

## 2014-10-22 DIAGNOSIS — R339 Retention of urine, unspecified: Secondary | ICD-10-CM | POA: Diagnosis not present

## 2014-10-22 DIAGNOSIS — C3490 Malignant neoplasm of unspecified part of unspecified bronchus or lung: Secondary | ICD-10-CM | POA: Diagnosis not present

## 2014-10-22 DIAGNOSIS — L03115 Cellulitis of right lower limb: Secondary | ICD-10-CM | POA: Diagnosis not present

## 2014-10-22 DIAGNOSIS — N390 Urinary tract infection, site not specified: Secondary | ICD-10-CM | POA: Diagnosis not present

## 2014-10-22 DIAGNOSIS — C7931 Secondary malignant neoplasm of brain: Secondary | ICD-10-CM | POA: Diagnosis not present

## 2014-10-22 DIAGNOSIS — R262 Difficulty in walking, not elsewhere classified: Secondary | ICD-10-CM | POA: Diagnosis not present

## 2014-10-22 DIAGNOSIS — C7989 Secondary malignant neoplasm of other specified sites: Secondary | ICD-10-CM | POA: Diagnosis not present

## 2014-10-22 DIAGNOSIS — F0390 Unspecified dementia without behavioral disturbance: Secondary | ICD-10-CM | POA: Diagnosis not present

## 2014-10-22 DIAGNOSIS — N319 Neuromuscular dysfunction of bladder, unspecified: Secondary | ICD-10-CM | POA: Diagnosis not present

## 2014-10-22 DIAGNOSIS — I82503 Chronic embolism and thrombosis of unspecified deep veins of lower extremity, bilateral: Secondary | ICD-10-CM | POA: Diagnosis not present

## 2014-10-22 DIAGNOSIS — Z7901 Long term (current) use of anticoagulants: Secondary | ICD-10-CM | POA: Diagnosis not present

## 2014-10-22 DIAGNOSIS — M25571 Pain in right ankle and joints of right foot: Secondary | ICD-10-CM | POA: Diagnosis not present

## 2014-10-22 DIAGNOSIS — M6281 Muscle weakness (generalized): Secondary | ICD-10-CM | POA: Diagnosis not present

## 2014-10-22 DIAGNOSIS — Z9181 History of falling: Secondary | ICD-10-CM | POA: Diagnosis not present

## 2014-10-22 DIAGNOSIS — K5660 Unspecified intestinal obstruction: Secondary | ICD-10-CM | POA: Diagnosis not present

## 2014-10-22 DIAGNOSIS — G4700 Insomnia, unspecified: Secondary | ICD-10-CM | POA: Diagnosis not present

## 2014-10-22 DIAGNOSIS — E559 Vitamin D deficiency, unspecified: Secondary | ICD-10-CM | POA: Diagnosis not present

## 2014-10-22 DIAGNOSIS — K5900 Constipation, unspecified: Secondary | ICD-10-CM | POA: Diagnosis not present

## 2014-10-22 DIAGNOSIS — C801 Malignant (primary) neoplasm, unspecified: Secondary | ICD-10-CM | POA: Diagnosis not present

## 2014-10-22 DIAGNOSIS — K567 Ileus, unspecified: Secondary | ICD-10-CM | POA: Diagnosis not present

## 2014-10-22 DIAGNOSIS — I10 Essential (primary) hypertension: Secondary | ICD-10-CM | POA: Diagnosis not present

## 2014-10-22 DIAGNOSIS — C3492 Malignant neoplasm of unspecified part of left bronchus or lung: Secondary | ICD-10-CM | POA: Diagnosis not present

## 2014-10-22 DIAGNOSIS — M84572D Pathological fracture in neoplastic disease, left ankle, subsequent encounter for fracture with routine healing: Secondary | ICD-10-CM | POA: Diagnosis not present

## 2014-10-22 DIAGNOSIS — C3491 Malignant neoplasm of unspecified part of right bronchus or lung: Secondary | ICD-10-CM | POA: Diagnosis not present

## 2014-10-22 DIAGNOSIS — Z9889 Other specified postprocedural states: Secondary | ICD-10-CM | POA: Diagnosis not present

## 2014-10-22 DIAGNOSIS — C73 Malignant neoplasm of thyroid gland: Secondary | ICD-10-CM | POA: Diagnosis not present

## 2014-10-22 DIAGNOSIS — R918 Other nonspecific abnormal finding of lung field: Secondary | ICD-10-CM | POA: Diagnosis not present

## 2014-10-22 DIAGNOSIS — R5381 Other malaise: Secondary | ICD-10-CM | POA: Diagnosis not present

## 2014-10-22 LAB — BASIC METABOLIC PANEL
Anion gap: 6 (ref 5–15)
BUN: 17 mg/dL (ref 6–23)
CO2: 27 mmol/L (ref 19–32)
Calcium: 8.1 mg/dL — ABNORMAL LOW (ref 8.4–10.5)
Chloride: 102 mmol/L (ref 96–112)
Creatinine, Ser: 0.64 mg/dL (ref 0.50–1.10)
GFR calc Af Amer: >90 mL/min (ref >90)
GFR calc non Af Amer: 89 mL/min — ABNORMAL LOW (ref >90)
Glucose, Bld: 94 mg/dL (ref 70–99)
Potassium: 3.5 mmol/L (ref 3.5–5.1)
Sodium: 135 mmol/L (ref 135–145)

## 2014-10-22 MED ORDER — HYDROCODONE-ACETAMINOPHEN 5-325 MG PO TABS: 1.0000 | ORAL_TABLET | Freq: Three times a day (TID) | ORAL | Status: DC | PRN

## 2014-10-22 MED ORDER — DOXYCYCLINE HYCLATE 100 MG PO TABS: 100.0000 mg | ORAL_TABLET | Freq: Two times a day (BID) | ORAL | Status: DC

## 2014-10-22 NOTE — Discharge Summary (Signed)
Physician Discharge Summary  Valerie [name] JOA:[id number] DOB: [birth year] DOA: 10/20/2014  PCP: [name], MD  Admit date: 10/20/2014 Discharge date: 10/22/2014  Time spent: 25 minutes  Recommendations for Outpatient Follow-up:  1. Recommend basic metabolic panel + CBC 1 week 2. Recommend outpatient follow-up oncologist at [facility] 3. Recommend downward titration of opiate medications as below 4. Recommend outpatient follow-up with orthopedics 5. Medication changes as below  Doxycycline 3/3-->10/27/14 for completion of 7 days of oral antibiotics for right lower extremity cellulitis which was improved  Changed dosing of Percocet from every 6 to every 8 hours ER and, changed 1-2 tablets-->1 tablet   She should continue her study medication  Please ensure that patient's Xarelto is transitioned to the 20 mg dose after a 21 day period of time  Discharge Diagnoses:  Principal Problem:   Ileus Active Problems:   Urinary retention   Cellulitis of leg, right   DVT, lower extremity   Discharge Condition: Good  Diet recommendation: Regular  Filed Weights   10/20/14 2120  Weight: 96.934 kg (213 lb 11.2 oz)   69 y/o ? h/o Met NSCLC + Brain Lung Ca  05/2001 [s/p lobectomy] with progression while onTarceva [EGFR + T790M mutations] on Exp [id number] via [facility] Dr. Aniceto Boss, recent Fall c Trimalleolar fracture status post ORIF 10/02/58, dementia?, Reactive airway disease admitted from home with significant obstipation constipation-disimpaction occurred in the emergency room with regular stools being passed subsequently    Ileus/obstipation-resolved after enema. Continue scheduled Dulcolax if needed, continue lactulose 20 grams when necessary if needed--going forward she will need a proper bowel regimen on discharge  Metastatic non-small cell lung cancer status post lobectomy + progression now on study medication by [facility]-Patient is to continue the study medication while here  Recent  trimalleolar fracture 09/2014-PT OT to assess patient with recommendations--patient will need close outpatient follow-up with orthopedics on discharge  Bilateral DVT diagnosis 10/18/14-started on Zaroxolyn-needs to be tapered to regular dosing 20 mg in about 15-20 days.  Cellulitis RLE- Vanc-->doxycycline 100 bid.  afebrile on discharge and decreased swelling   Consultants:  None  Procedures:  None  Antibiotics:  Vancomycin 3/2  Doxycycline 3/3--10/27/14  Discharge Exam: Filed Vitals:   10/22/14 0549  BP: 96/55  Pulse: 86  Temp: 97.9 F (36.6 C)  Resp: 18   alert pleasant oriented no apparent distress  General: EOMI NCAT Cardiovascular: S1-S2 no murmur rub or gallop Respiratory: Clinically clear Right lower extremity is soft nontender with decreased swelling and erythema  Discharge Instructions   Discharge Instructions    Diet - low sodium heart healthy    Complete by:  As directed      Increase activity slowly    Complete by:  As directed           Current Discharge Medication List    START taking these medications   Details  doxycycline (VIBRA-TABS) 100 MG tablet Take 1 tablet (100 mg total) by mouth every 12 (twelve) hours. Qty: 12 tablet, Refills: 0      CONTINUE these medications which have CHANGED   Details  HYDROcodone-acetaminophen (NORCO) 5-325 MG per tablet Take 1 tablet by mouth every 8 (eight) hours as needed for severe pain. Qty: 40 tablet, Refills: 0      CONTINUE these medications which have NOT CHANGED   Details  aspirin EC 81 MG tablet Take 81 mg by mouth at bedtime.    Associated Diagnoses: Memory impairment    bisacodyl (DULCOLAX) 10 MG suppository Place  10 mg rectally daily as needed for moderate constipation.    cholecalciferol (VITAMIN D) 1000 UNITS tablet Take 1,000 Units by mouth at bedtime.    docusate sodium (COLACE) 100 MG capsule Take 1 capsule (100 mg total) by mouth 2 (two) times daily. Qty: 10 capsule, Refills: 0      donepezil (ARICEPT) 10 MG tablet Take 1 tablet (10 mg total) by mouth daily. Qty: 90 tablet, Refills: 0   Associated Diagnoses: Memory impairment    indomethacin (INDOCIN) 50 MG capsule Take 50 mg by mouth every 8 (eight) hours as needed for mild pain or moderate pain.    lactulose (CEPHULAC) 20 G packet Take 20 g by mouth daily as needed (constipation).    mirtazapine (REMERON) 15 MG tablet Take 15 mg by mouth at bedtime.    Naloxegol Oxalate 25 MG TABS Take 1 tablet by mouth daily.    research study medication Take home supply.    Rivaroxaban (XARELTO) 15 MG TABS tablet Take 15 mg by mouth 2 (two) times daily with a meal.    sennosides (SENOKOT) 8.8 MG/5ML syrup Take 30 mLs by mouth at bedtime.    vitamin C (ASCORBIC ACID) 500 MG tablet Take 500 mg by mouth daily.     alendronate (FOSAMAX) 70 MG tablet Take 1 tablet (70 mg total) by mouth every 7 (seven) days. Saturdays; Take with a full glass of water on an empty stomach. Qty: 4 tablet, Refills: 12   Associated Diagnoses: Osteoporosis      STOP taking these medications     Lactulose SOLN      Multiple Vitamin (MULITIVITAMIN WITH MINERALS) TABS      Promethazine HCl (PHENERGAN IJ)      cephALEXin (KEFLEX) 500 MG capsule      cephALEXin (KEFLEX) 500 MG capsule      polyethylene glycol (MIRALAX / GLYCOLAX) packet        No Known Allergies    The results of significant diagnostics from this hospitalization (including imaging, microbiology, ancillary and laboratory) are listed below for reference.    Significant Diagnostic Studies: Dg Ankle 2 Views Left  09/30/2014   CLINICAL DATA:  Left ankle fracture dislocation. Followup getting into a car today. Postreduction images.  EXAM: LEFT ANKLE - 2 VIEW  COMPARISON:  09/30/2014  FINDINGS: Restored tibiotalar alignment. Posterior malleolar fracture with fractures involving the medial malleolus and segmental fracture of the distal fibular metadiaphysis. Mortise width uniform.  Fiberglass splint in place and obscures bony detail.  IMPRESSION: 1. Restored tibiotalar alignment. Trimalleolar fracture. Mortise distance uniform currently.   Electronically Signed   By: Van Clines M.D.   On: 09/30/2014 15:44   Dg Ankle Complete Left  10/02/2014   CLINICAL DATA:  open reduction internal fixation for left ankle fracture  EXAM: DG C-ARM 1-60 MIN - [facility]; LEFT ANKLE COMPLETE - 3+ VIEW  COMPARISON:  None.  FLUOROSCOPY TIME:  Fluoroscopy Time:  0 minutes 33 seconds  Number of Acquired Images:  3  FINDINGS: Frontal, oblique, and lateral views were obtained. There is screw and plate affixed station in the distal fibula with alignment in this area essentially anatomic. There are 2 screws transfixing the medial malleolus. Alignment in this area is anatomic. Ankle mortise appears intact.  IMPRESSION: Screw fixation to the medial malleolus. Screw and plate fixation distal fibula. Alignment essentially anatomic in these areas. Ankle mortise intact.   Electronically Signed   By: Lowella Grip III M.D.   On: 10/02/2014 14:15  Dg Ankle Complete Left  09/30/2014   CLINICAL DATA:  Fall with ankle deformity.  Initial encounter  EXAM: LEFT ANKLE COMPLETE - 3+ VIEW  COMPARISON:  None.  FINDINGS: Ankle fracture dislocation:  *There is a comminuted, segmental medially and anteriorly angulated distal fibular diaphysis fracture. *Posterior malleolus fracture involving 11 mm of the posterior articular surface, with wide distraction. *Comminuted medial malleolus fracturing which continues to the central plafond. *The ankle is dislocated posteriorly.  No hindfoot fracture or malalignment.  IMPRESSION: Ankle fracture/dislocation, as described above.   Electronically Signed   By: Monte Fantasia M.D.   On: 09/30/2014 13:56   Dg Ankle Complete Right  10/01/2014   CLINICAL DATA:  69 year old female who fell yesterday ray with casted left ankle fracture. Right lower extremity pain. Initial encounter.   EXAM: RIGHT ANKLE - COMPLETE 3+ VIEW  COMPARISON:  None.  FINDINGS: Bone mineralization is within normal limits. In each calcaneus intact. Mortise joint alignment preserved. Talar dome intact. No definite joint effusion. No acute fracture identified.  IMPRESSION: No acute fracture or dislocation identified about the right ankle.   Electronically Signed   By: Genevie Ann M.D.   On: 10/01/2014 19:27   Mr Lumbar Spine Wo Contrast  10/20/2014   CLINICAL DATA:  [place] [address] in [employer] parking lot October 08, 2014 injuring lower back. History of L3 and L4 fracture. History of metastatic lung cancer. Recent bowel impaction, urinary bladder retention, lower extremity weakness, and clinical concern for neurogenic bladder.  EXAM: MRI LUMBAR SPINE WITHOUT CONTRAST  TECHNIQUE: Multiplanar, multisequence MR imaging of the lumbar spine was performed. No intravenous contrast was administered.  COMPARISON:  CT of the abdomen and pelvis October 20, 2014 at 1826 hours and CT of the abdomen and pelvis January 20, 2014  FINDINGS: Using the reference level of the last well-formed intervertebral disc as L5-S1, moderate L4 vertebral body compression fracture with approximately 50% height loss, mild to moderate L5 compression fracture with approximately 20% height loss. These are new from June 2015 though, no STIR signal abnormality to suggest acute fracture. No malalignment. Remaining lumbar vertebral bodies intact. Moderate to severe L5-S1 disc height loss, remaining disc morphology is maintained, decreased T2 signal within the L2-3 through L5-S1 disc consistent with mild desiccation. Mild chronic discogenic endplate changes N5-6 and L4-5, mild subacute to chronic discogenic endplate change at O1-H0.  Conus medullaris terminates at T12-L1 and appears normal in morphology and signal characteristics, partially imaged. Cauda equina is unremarkable. Moderate to severe paraspinal muscle atrophy, with bright interstitial STIR signal suggesting a component  denervation. Large amount of stool in the large bowel partially imaged, with mild hydronephrosis bilaterally, incompletely assessed.  Level by level evaluation:  T12-L1, L1-2: No disc bulge, canal stenosis nor neural foraminal narrowing.  L2-3: Small LEFT extra foraminal disc protrusion encroaches upon the exited LEFT L2 nerve. Mild facet arthropathy and ligamentum flavum redundancy. Mild canal stenosis. Mild LEFT neural foraminal narrowing. Tiny LEFT facet synovial cyst extending into the paraspinal soft tissues.  L3-4: 3 mm broad-based disc bulge, moderate facet arthropathy and ligamentum flavum redundancy with trace facet effusions which are likely reactive. Moderate to severe canal stenosis and trefoil configuration with partial effacement lateral recesses which may affect the traversing L4 nerve. Mild LEFT neural foraminal narrowing. Bilateral LEFT greater than RIGHT subcentimeter synovial facet cysts extend into the paraspinal soft tissues.  L4-5: 3 mm broad-based disc bulge, central annular tear. Mild facet arthropathy and ligamentum flavum redundancy. Mild canal stenosis. Mild bilateral  neural foraminal narrowing.  L5-S1: 2 mm annular bulging, mild facet arthropathy and ligamentum flavum redundancy without canal stenosis. Mild RIGHT, mild to moderate LEFT neural foraminal narrowing.  IMPRESSION: Remote moderate L4 and mild to moderate L5 compression fractures. No acute fracture or malalignment.  Degenerative change of the lumbar spine resulting in moderate to severe canal stenosis at L3-4, mild at L2-3 and L3-4.  Neural foraminal narrowing L2-3 through L5-S1: Mild to moderate on the LEFT at L5-S1.  Moderate to severe paraspinal muscle atrophy with a component of suspected denervation.   Electronically Signed   By: Elon Alas   On: 10/20/2014 22:16   Ct Abdomen Pelvis W Contrast  10/20/2014   CLINICAL DATA:  Abdominal pain and distention L1-2 weeks. History of lung cancer.  EXAM: CT ABDOMEN AND  PELVIS WITH CONTRAST  TECHNIQUE: Multidetector CT imaging of the abdomen and pelvis was performed using the standard protocol following bolus administration of intravenous contrast.  CONTRAST:  100 mL OMNIPAQUE IOHEXOL 300 MG/ML  SOLN  COMPARISON:  CT chest, abdomen and pelvis 01/20/2014.  FINDINGS: The patient has small bilateral pleural effusions, larger on the left. Small focus of airspace opacity is seen in the left lung base. 0.9 cm nodule in the left lower lobe seen on the comparison examination is not visible on this study and may be obscured. No pleural effusion is identified.  There is massive distention of the urinary bladder and moderate bilateral hydronephrosis. These findings are new since the comparison CT scan. Question bladder outlet obstruction or neurogenic bladder. Cause for this abnormality is not identified.  The ascending and transverse colon are markedly distended. Liquid stool is seen in the descending colon. In the sigmoid colon, the wall appears somewhat thickened although this is likely due to fluid surrounding stool rather than true wall thickening.  The gallbladder, liver, spleen, adrenal glands and pancreas appear normal. Small bowel loops are normal in appearance. Subcutaneous edema is present about the pelvis and upper legs bilaterally. No lymphadenopathy is identified.  Since the prior CT scan, the patient has suffered L4 and L5 superior endplate compression fractures. No lytic or sclerotic lesion association with the fractures is identified. Defect in left lower ribs consistent with postoperative change is noted.  IMPRESSION: Massive distention of the urinary bladder and associated moderate bilateral hydronephrosis. Cause for this abnormality is not identified. Question bladder outlet obstruction or neurogenic bladder. This finding is new since the comparison CT scan.  Dilatation of the colon with liquid stool in the descending and sigmoid. No obstruction is identified and  dilatation is likely due to colonic ileus.  Small bilateral pleural effusions, larger on the left. Incomplete visualization of airspace opacity in left lung base may reflect atelectasis. Left lower lobe pulmonary nodule seen on the prior chest CT is not visible on this exam and may be obscured.  L4 and L5 superior endplate compression fractures are new since the prior CT. No lytic or sclerotic lesion associated show with the fractures is identified.   Electronically Signed   By: Inge Rise M.D.   On: 10/20/2014 18:59   Mr Ankle Right  Wo Contrast  10/21/2014   CLINICAL DATA:  History of fall 09/30/2014. Right ankle swelling with a scabbed area on the medial aspect of the ankle. Recent diagnosis of cellulitis.  EXAM: MRI OF THE RIGHT ANKLE WITHOUT CONTRAST  TECHNIQUE: Multiplanar, multisequence MR imaging of the ankle was performed. No intravenous contrast was administered.  COMPARISON:  None.  FINDINGS: Extensive  subcutaneous edema is seen about the ankle without focal fluid collection. Edema appears diffuse.  TENDONS  Peroneal: Intact.  Posteromedial: Intact.  Anterior: Intact.  Achilles: Intact.  Plantar Fascia: Unremarkable.  LIGAMENTS  Lateral: Intact.  Medial: Intact.  CARTILAGE  Ankle Joint: Unremarkable.  Subtalar Joints/Sinus Tarsi: Unremarkable.  Bones: There is marrow edema in the tibia eccentric on the lateral side and posteriorly. A small focus of milder marrow edema is seen in the anterior aspect of the lateral, distal tibia. The appearance is most suggestive of contusion rather than osteomyelitis.  IMPRESSION: Extensive subcutaneous edema about the ankle without focal fluid collection is consistent with cellulitis.  Marrow edema in the distal tibia eccentric on the lateral side and posterior is likely secondary to bone contusion given history of fall and reported scab on the medial aspect of the ankle. Although thought much less likely, osteomyelitis cannot be completely excluded.    Electronically Signed   By: Inge Rise M.D.   On: 10/21/2014 08:42   Dg Chest Port 1 View  10/04/2014   CLINICAL DATA:  Shortness of breath, recent lower extremity fracture. The patient has a history of lung cancer with prior lobectomy but further details are not available and there is no prior imaging.  EXAM: PORTABLE CHEST - 1 VIEW  COMPARISON:  None.  FINDINGS: Lungs are markedly hypoaerated. The device overlies the right lung base. Lucency over the heart likely represents hiatal hernia or elevated left hemidiaphragm. Moderate enlargement of the cardiomediastinal silhouette is noted. There is any irregular masslike opacity in the left suprahilar region with left upper hilar retraction. Moderate pleural effusions are noted obscuring the lung bases. Curvilinear atelectasis or scarring noted over the right midlung zone versus fluid tracking in the minor fissure. No acute osseous finding.  IMPRESSION: Moderate pleural effusions obscuring the lung bases.  Left upward hilar retraction with masslike opacity in the left suprahilar region. Although this may represent fibrosis, amass could have a similar appearance. Posttreatment change after left upper lobectomy could appear similar. Comparison with presumed outside previous imaging would be helpful for comparison. If symptoms persist, consider PA and lateral chest radiographs obtained at full inspiration when the patient is clinically able.   Electronically Signed   By: Conchita Paris M.D.   On: 10/04/2014 17:18   Dg Chest Port 1 View  09/30/2014   CLINICAL DATA:  Recent ankle fracture dislocation with known history of lung carcinoma  EXAM: PORTABLE CHEST - 1 VIEW  COMPARISON:  01/20/2014  FINDINGS: Cardiac shadow is mildly enlarged but stable from the prior exam. Postsurgical changes are again seen in the left hilum. Central post radiation changes are again noted bilaterally. Elevation of left hemidiaphragm is again seen. No focal infiltrate or sizable  effusion is noted.  IMPRESSION: Chronic changes related to the patient's known history of lung carcinoma. No acute abnormality is seen.   Electronically Signed   By: Inez Catalina M.D.   On: 09/30/2014 15:43   Dg Knee Complete 4 Views Right  10/01/2014   CLINICAL DATA:  Fall yesterday. Right knee pain. Initial encounter.  EXAM: RIGHT KNEE - COMPLETE 4+ VIEW  COMPARISON:  None.  FINDINGS: Nondisplaced proximal fibular fracture is demonstrated. No other fractures are identified. No evidence of dislocation. No evidence of knee joint effusion. Mild degenerative spurring seen involving the patellofemoral compartment.  IMPRESSION: Nondisplaced proximal fibular fracture.   Electronically Signed   By: Earle Gell M.D.   On: 10/01/2014 19:28   Dg Abd  Acute W/chest  10/20/2014   CLINICAL DATA:  Soft tissue mass in the lower abdomen on physical exam. Initial encounter.  EXAM: ACUTE ABDOMEN SERIES (ABDOMEN 2 VIEW & CHEST 1 VIEW)  COMPARISON:  CT abdomen 01/20/2014.  FINDINGS: There is no out acute cardiopulmonary disease. Marked gaseous distension of bowel is present with elevation of the LEFT hemidiaphragm. Although elevation of the LEFT hemidiaphragm has been a chronic finding, this is more pronounced on today's exam. Lung volumes are also lower than on previous studies. Monitoring leads project over the chest. Postoperative changes are present in the LEFT upper lobe and some of this accounts for volume loss and elevation of the LEFT hemidiaphragm.  There is no free air under the hemidiaphragms on semi upright imaging. There is gaseous distension of: , with multiple air-fluid levels. Decubitus images were obtained and there is no intra-abdominal free air. Stool is present in the rectosigmoid. Mild dilation of small bowel loops measuring up to 3.3 cm. The cecum measures 10.7 cm and is distended with gas. Cecal position appears within normal limits.  IMPRESSION: 1. Postsurgical changes in the LEFT upper lobe with elevation  of the LEFT hemidiaphragm. 2. Small and large bowel dilation with marked gaseous distension and air-fluid levels. The appearance is nonspecific and with both large in small bowel dilation, the findings suggest ileus. Colonic obstruction is considered less likely. 3. No intra-abdominal free air.   Electronically Signed   By: Dereck Ligas M.D.   On: 10/20/2014 15:37   Dg Foot Complete Right  10/01/2014   CLINICAL DATA:  Fall yesterday. Right lateral foot pain. Initial encounter.  EXAM: RIGHT FOOT COMPLETE - 3+ VIEW  COMPARISON:  None.  FINDINGS: There is no evidence of fracture or dislocation. There is no evidence of arthropathy or other focal bone abnormality. Soft tissues are unremarkable.  IMPRESSION: Negative.   Electronically Signed   By: Earle Gell M.D.   On: 10/01/2014 19:27   Dg C-arm 1-60 Min-no Report  10/02/2014   CLINICAL DATA:  open reduction internal fixation for left ankle fracture  EXAM: DG C-ARM 1-60 MIN - [facility]; LEFT ANKLE COMPLETE - 3+ VIEW  COMPARISON:  None.  FLUOROSCOPY TIME:  Fluoroscopy Time:  0 minutes 33 seconds  Number of Acquired Images:  3  FINDINGS: Frontal, oblique, and lateral views were obtained. There is screw and plate affixed station in the distal fibula with alignment in this area essentially anatomic. There are 2 screws transfixing the medial malleolus. Alignment in this area is anatomic. Ankle mortise appears intact.  IMPRESSION: Screw fixation to the medial malleolus. Screw and plate fixation distal fibula. Alignment essentially anatomic in these areas. Ankle mortise intact.   Electronically Signed   By: Lowella Grip III M.D.   On: 10/02/2014 14:15    Microbiology: No results found for this or any previous visit (from the past 240 hour(s)).   Labs: Basic Metabolic Panel:  Recent Labs Lab 10/20/14 1434 10/21/14 0610 10/22/14 0527  NA 130* 135 135  K 4.0 3.3* 3.5  CL 96 102 102  CO2 $Re'24 28 27  'bBr$ GLUCOSE 137* 92 94  BUN 26* 18 17  CREATININE 0.71  0.55 0.64  CALCIUM 9.4 8.7 8.1*   Liver Function Tests:  Recent Labs Lab 10/21/14 0610  AST 22  ALT 24  ALKPHOS 72  BILITOT 0.5  PROT 5.2*  ALBUMIN 2.5*   No results for input(s): LIPASE, AMYLASE in the last 168 hours. No results for input(s): AMMONIA  in the last 168 hours. CBC:  Recent Labs Lab 10/20/14 1434 10/21/14 0610  WBC 15.6* 8.9  NEUTROABS 14.2* 7.0  HGB 12.2 9.7*  HCT 37.4 30.3*  MCV 89.0 89.9  PLT 399 275   Cardiac Enzymes: No results for input(s): CKTOTAL, CKMB, CKMBINDEX, TROPONINI in the last 168 hours. BNP: BNP (last 3 results) No results for input(s): BNP in the last 8760 hours.  ProBNP (last 3 results) No results for input(s): PROBNP in the last 8760 hours.  CBG: No results for input(s): GLUCAP in the last 168 hours.     SignedNita Sells  [facility] 10/22/2014, 10:04 AM

## 2014-10-22 NOTE — Progress Notes (Signed)
OT Cancellation Note  Patient Details Name: Valerie [name] MRN: [id number] DOB: [birth year]   Cancelled Treatment:    Reason Eval/Treat Not Completed: Other (comment).  Noted plan is for SNF today; will defer to SNF unless pt remains beyond today.  Then, we will check back.   SPENCER,MARYELLEN 10/22/2014, 11:54 AM  Lesle Chris, OTR/L [phone] 10/22/2014

## 2014-10-22 NOTE — Progress Notes (Signed)
PT Cancellation Note  Patient Details Name: Valerie [name] MRN: [id number] DOB: 04-21-1946   Cancelled Treatment:    Reason Eval/Treat Not Completed: Other (comment) (pt declined PT at this time, stated she wants to eat and wash up, then go back to SNF. )   [name] 10/22/2014, 12:22 PM  [phone]

## 2014-10-22 NOTE — Progress Notes (Signed)
CSW met with Pt to inform her of discharge today. Pt reported that she was aware and requested to know when transportation was scheduled so she could inform her husband.   CSW scheduled [facility] transportation for 1:00pm.  Discharge packet is completed and placed in chart. RN will call report to [facility]  CSW signing off but available if needed.  Peri Maris, LCSWA 10/22/2014 10:59 AM

## 2014-10-26 DIAGNOSIS — R262 Difficulty in walking, not elsewhere classified: Secondary | ICD-10-CM | POA: Diagnosis not present

## 2014-10-26 DIAGNOSIS — R5381 Other malaise: Secondary | ICD-10-CM | POA: Diagnosis not present

## 2014-10-27 DIAGNOSIS — I10 Essential (primary) hypertension: Secondary | ICD-10-CM | POA: Diagnosis not present

## 2014-10-27 DIAGNOSIS — M84572D Pathological fracture in neoplastic disease, left ankle, subsequent encounter for fracture with routine healing: Secondary | ICD-10-CM | POA: Diagnosis not present

## 2014-10-27 DIAGNOSIS — I82503 Chronic embolism and thrombosis of unspecified deep veins of lower extremity, bilateral: Secondary | ICD-10-CM | POA: Diagnosis not present

## 2014-10-27 DIAGNOSIS — R5381 Other malaise: Secondary | ICD-10-CM | POA: Diagnosis not present

## 2014-10-27 DIAGNOSIS — L03115 Cellulitis of right lower limb: Secondary | ICD-10-CM | POA: Diagnosis not present

## 2014-10-27 DIAGNOSIS — R262 Difficulty in walking, not elsewhere classified: Secondary | ICD-10-CM | POA: Diagnosis not present

## 2014-10-28 DIAGNOSIS — Z7901 Long term (current) use of anticoagulants: Secondary | ICD-10-CM | POA: Diagnosis not present

## 2014-10-28 DIAGNOSIS — M79604 Pain in right leg: Secondary | ICD-10-CM | POA: Diagnosis not present

## 2014-10-28 DIAGNOSIS — C3490 Malignant neoplasm of unspecified part of unspecified bronchus or lung: Secondary | ICD-10-CM | POA: Diagnosis not present

## 2014-10-28 DIAGNOSIS — I10 Essential (primary) hypertension: Secondary | ICD-10-CM | POA: Diagnosis not present

## 2014-11-01 DIAGNOSIS — M84572D Pathological fracture in neoplastic disease, left ankle, subsequent encounter for fracture with routine healing: Secondary | ICD-10-CM | POA: Diagnosis not present

## 2014-11-01 DIAGNOSIS — L03115 Cellulitis of right lower limb: Secondary | ICD-10-CM | POA: Diagnosis not present

## 2014-11-01 DIAGNOSIS — C801 Malignant (primary) neoplasm, unspecified: Secondary | ICD-10-CM | POA: Diagnosis not present

## 2014-11-01 DIAGNOSIS — C73 Malignant neoplasm of thyroid gland: Secondary | ICD-10-CM | POA: Diagnosis not present

## 2014-11-02 DIAGNOSIS — R262 Difficulty in walking, not elsewhere classified: Secondary | ICD-10-CM | POA: Diagnosis not present

## 2014-11-02 DIAGNOSIS — R5381 Other malaise: Secondary | ICD-10-CM | POA: Diagnosis not present

## 2014-11-03 ENCOUNTER — Other Ambulatory Visit: Payer: Self-pay | Admitting: Medical Oncology

## 2014-11-03 DIAGNOSIS — C3490 Malignant neoplasm of unspecified part of unspecified bronchus or lung: Secondary | ICD-10-CM

## 2014-11-04 ENCOUNTER — Other Ambulatory Visit: Payer: Self-pay | Admitting: Medical Oncology

## 2014-11-04 ENCOUNTER — Telehealth: Payer: Self-pay | Admitting: *Deleted

## 2014-11-04 ENCOUNTER — Telehealth: Payer: Self-pay | Admitting: Internal Medicine

## 2014-11-04 DIAGNOSIS — C3490 Malignant neoplasm of unspecified part of unspecified bronchus or lung: Secondary | ICD-10-CM

## 2014-11-04 DIAGNOSIS — M25571 Pain in right ankle and joints of right foot: Secondary | ICD-10-CM | POA: Diagnosis not present

## 2014-11-04 DIAGNOSIS — Z9889 Other specified postprocedural states: Secondary | ICD-10-CM | POA: Diagnosis not present

## 2014-11-04 NOTE — Telephone Encounter (Signed)
s.w. pt husband to give 4.1 appt....pt has another appt and wants different day and he requested 10am time.Marland KitchenMarland KitchenMarland KitchenI gv him first available

## 2014-11-04 NOTE — Telephone Encounter (Signed)
PT. IS UNABLE TO WALK. SHE STATES DR.STINCHCOMBE AT [facility] NEEDS A CT SCAN ON HER SO SHE CAN STAY ON  THE CLINICAL TRIAL. THIS NOTE ROUTED TO DR.MOHAMED AND DIANE BELL,RN.

## 2014-11-05 NOTE — Addendum Note (Signed)
Addended by: Ardeen Garland on: 11/05/2014 04:10 PM   Modules accepted: Orders

## 2014-11-05 NOTE — Telephone Encounter (Signed)
Onc tx request sent for Ct chest.

## 2014-11-09 ENCOUNTER — Telehealth: Payer: Self-pay | Admitting: *Deleted

## 2014-11-09 NOTE — Telephone Encounter (Signed)
Patient called because she might be removed from the clinical trial in which she is participating through [facility].  She said the study doctor is supposed to call her tonight.  Will let Dr. Julien Nordmann know.

## 2014-11-10 ENCOUNTER — Other Ambulatory Visit: Payer: Self-pay | Admitting: Medical Oncology

## 2014-11-10 ENCOUNTER — Telehealth: Payer: Self-pay | Admitting: Internal Medicine

## 2014-11-10 NOTE — Telephone Encounter (Signed)
Onc tx request sent to move up appt

## 2014-11-10 NOTE — Telephone Encounter (Signed)
Spoke with patient and she is aware of her appt on /28,ok per diane/verbal to use md pnly spot     annep

## 2014-11-15 ENCOUNTER — Encounter: Payer: Self-pay | Admitting: Medical Oncology

## 2014-11-15 ENCOUNTER — Encounter (HOSPITAL_COMMUNITY): Payer: Self-pay

## 2014-11-15 ENCOUNTER — Ambulatory Visit (HOSPITAL_BASED_OUTPATIENT_CLINIC_OR_DEPARTMENT_OTHER): Payer: Medicare Other | Admitting: Internal Medicine

## 2014-11-15 ENCOUNTER — Encounter: Payer: Self-pay | Admitting: Internal Medicine

## 2014-11-15 ENCOUNTER — Ambulatory Visit (HOSPITAL_COMMUNITY)
Admission: RE | Admit: 2014-11-15 | Discharge: 2014-11-15 | Disposition: A | Discharge status: Home / Self Care | Payer: Medicare Other | Source: Ambulatory Visit | Attending: Internal Medicine | Admitting: Internal Medicine

## 2014-11-15 ENCOUNTER — Telehealth: Payer: Self-pay | Admitting: Medical Oncology

## 2014-11-15 VITALS — BP 102/57 | HR 101 | Temp 98.6°F | Resp 18 | Ht 68.0 in | Wt 193.0 lb

## 2014-11-15 DIAGNOSIS — C7931 Secondary malignant neoplasm of brain: Secondary | ICD-10-CM

## 2014-11-15 DIAGNOSIS — C3490 Malignant neoplasm of unspecified part of unspecified bronchus or lung: Secondary | ICD-10-CM

## 2014-11-15 DIAGNOSIS — C3491 Malignant neoplasm of unspecified part of right bronchus or lung: Secondary | ICD-10-CM | POA: Diagnosis not present

## 2014-11-15 DIAGNOSIS — M810 Age-related osteoporosis without current pathological fracture: Secondary | ICD-10-CM

## 2014-11-15 DIAGNOSIS — C3492 Malignant neoplasm of unspecified part of left bronchus or lung: Secondary | ICD-10-CM | POA: Diagnosis not present

## 2014-11-15 DIAGNOSIS — R918 Other nonspecific abnormal finding of lung field: Secondary | ICD-10-CM | POA: Diagnosis not present

## 2014-11-15 DIAGNOSIS — C7989 Secondary malignant neoplasm of other specified sites: Secondary | ICD-10-CM | POA: Insufficient documentation

## 2014-11-15 MED ORDER — IOHEXOL 300 MG/ML  SOLN
80.0000 mL | Freq: Once | INTRAMUSCULAR | Status: AC | PRN
  Administered 2014-11-15: 80 mL via INTRAVENOUS

## 2014-11-15 MED ORDER — OSIMERTINIB MESYLATE 80 MG PO TABS: 80.0000 mg | ORAL_TABLET | Freq: Every day | ORAL | Status: DC

## 2014-11-15 NOTE — Progress Notes (Signed)
Unable to reach pts nurse at [facility] to reconcile meds.

## 2014-11-15 NOTE — Progress Notes (Signed)
[place] [phone]   [phone]  OFFICE PROGRESS NOTE  Valerie Greenland, MD [address] [place] [address]  DIAGNOSIS: Metastatic non-small cell lung cancer with brain metastasis, diagnosed initially a stage IIB adenocarcinoma in October 2002.  BIOMARKERS TESTING ([facility]): Positive for: EGFR [id number] del, T790M, CTNNB1 D32G, SMAD4 T187fs*10 Negative for: RET, ALK, BRAF, KRAS, ERBB2 and MET  PRIOR THERAPY:  1. Status post left lower lobectomy under the care of Dr Arlyce Dice in October 2002. 2. Status post course of concurrent chemoradiation with weekly carboplatin and paclitaxel under the care of Dr Benay Spice and Dr Elba Barman in early 2003. 3. Status post treatment with Celebrex and Iressa according to the clinical trial at [facility] for a total of 1 year. 4. Status post left occipital craniotomy for tumor resection on September 21, 2005, under the care of Dr Christella Noa. 5. Status post whole brain irradiation under the care of Dr Elba Barman, completed November 07, 2005. 6. Status post gamma knife treatment to the resected cavitary recurrence on June 02, 2008, at [facility]. 7. Status post right thyroidectomy with radical neck dissection under the care of Dr. Constance Holster on 10/18/2011. 8. Status post excisional biopsy of right cervical lymph node that was consistent with metastatic adenocarcinoma. 9. Tarceva 150 mg by mouth daily with therapy beginning 06/03/2007, discontinued in July 2015 secondary to disease progression. Status post 79 cycles. 10. palliative radiotherapy to the enlarging right upper lobe lung nodule under the care of Dr. Valere Dross. 11. Treatment at [facility] on STUDY [id number]  300 mg by mouth daily for 21 days discontinued secondary to study deviation and inability for the patient to keep her appointment at [facility] secondary to recent hip fractures. 12. Open reduction and internal  fixation of trimalleolar ankle fracture dislocation with fixation of the fibula as well as medial          malleolus.   CURRENT THERAPY:  1) Tagrisso 80 mg by mouth daily, expected to start in the next few days.  CHEMOTHERAPY INTENT: palliative  CURRENT # OF CHEMOTHERAPY CYCLES: 1  CURRENT ANTIEMETICS: Compazine when necessary  CURRENT SMOKING STATUS: nonsmoker  ORAL CHEMOTHERAPY AND CONSENT: Tarceva  CURRENT BISPHOSPHONATES USE: None  PAIN MANAGEMENT: None  NARCOTICS INDUCED CONSTIPATION: None  LIVING WILL AND CODE STATUS: Full code  INTERVAL HISTORY: Valerie [name] 69 y.o. female returns to the clinic today for three-month follow up visit accompanied by her husband. The patient was last seen in July 2015. She was referred to Dr. Aniceto Boss at [facility] for enrollment in a clinical trial with [id number] for the last 8 months. The patient was tolerating this treatment well. Recently she had fracture of the left ankle status post surgical fixation. The patient was unable to keep her appointment at [facility] because of immobility and this created a lot of deviation on the study. Dr. Aniceto Boss referred the patient back to me for evaluation and consideration of treatment with third generation EGFR tyrosine kinase inhibitor. The patient is here today for evaluation and discussion of her treatment options. She is feeling fine with no specific complaints except for inability to bear weight on her left foot. She denied having any significant chest pain, shortness breath, cough or hemoptysis. She denied having any significant nausea or vomiting. She had repeat CT scan of the chest performed earlier today for restaging of her disease.  MEDICAL HISTORY: Past Medical  History  Diagnosis Date  . Heart murmur   . Shortness of breath     hx lung ca   . Arthritis   . Lung cancer dx'd 2002  . Metastasis to brain dx'd 2008  . 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 Anal  fistula   . H/O osteoporosis   . H/O vitamin D deficiency   . History of measles, mumps, or rubella   . H/O varicella   . Hypertension   . Yeast infection   . Abnormal Pap smear 2006  . Atrophic vaginitis 2008  . Dyspareunia 2008  . Dementia 2009  . Osteoporosis 2010  . Status post chemotherapy 2003    CARBOPLATIN/PACLITAXEL /STATUS POST CLINICAL TRAIL OF CELEBREX AND IRESSA AT [facility] FOR 1 YEAR  . Status post radiation therapy 2003    LEFT LUNG  . Status post radiation therapy 11/07/2005    WHOLE BRAIN: DR Larkin [facility]  . Status post radiation therapy 06/02/2008    GAMMA KNIFE OF RESECTED CAVITAY  . On antineoplastic chemotherapy     TARCEVA  . Nodule of right lung CT- 06/03/12    RIGHT UPPER LOBE  . Thyroid adenoma     ?  [name] Thyroid cancer 10/18/11 bx    adenoid nodules   . Anxiety   . Cataract   . Nodule of right lung 06/03/12    Upper Lobe  . Lung metastasis     PET scan 05/05/13, RUL lung nodule  . History of radiation therapy 07/28/13- 08/10/13    right lung metastasis 5000 cGy 10 sessions  . Lung cancer   . Lung cancer   . Ankle fracture     ALLERGIES:  has No Known Allergies.  MEDICATIONS:  Current Outpatient Prescriptions  Medication Sig Dispense Refill  . alendronate (FOSAMAX) 70 MG tablet Take 1 tablet (70 mg total) by mouth every 7 (seven) days. Saturdays; Take with a full glass of water on an empty stomach. (Patient not taking: Reported on 10/20/2014) 4 tablet 12  . aspirin EC 81 MG tablet Take 81 mg by mouth at bedtime.     . bisacodyl (DULCOLAX) 10 MG suppository Place 10 mg rectally daily as needed for moderate constipation.    . cholecalciferol (VITAMIN D) 1000 UNITS tablet Take 1,000 Units by mouth at bedtime.    . docusate sodium (COLACE) 100 MG capsule Take 1 capsule (100 mg total) by mouth 2 (two) times daily. 10 capsule 0  . donepezil (ARICEPT) 10 MG tablet Take 1 tablet (10 mg total) by mouth daily. (Patient taking differently: Take 10 mg by mouth at  bedtime. ) 90 tablet 0  . doxycycline (VIBRA-TABS) 100 MG tablet Take 1 tablet (100 mg total) by mouth every 12 (twelve) hours. 12 tablet 0  . HYDROcodone-acetaminophen (NORCO) 5-325 MG per tablet Take 1 tablet by mouth every 8 (eight) hours as needed for severe pain. 30 tablet 0  . indomethacin (INDOCIN) 50 MG capsule Take 50 mg by mouth every 8 (eight) hours as needed for mild pain or moderate pain.    [name] lactulose (CEPHULAC) 20 G packet Take 20 g by mouth daily as needed (constipation).    . mirtazapine (REMERON) 15 MG tablet Take 15 mg by mouth at bedtime.    . Naloxegol Oxalate 25 MG TABS Take 1 tablet by mouth daily.    . research study medication Take home supply.    . Rivaroxaban (XARELTO) 15 MG TABS tablet Take 15 mg by mouth  2 (two) times daily with a meal.    . sennosides (SENOKOT) 8.8 MG/5ML syrup Take 30 mLs by mouth at bedtime.    . vitamin C (ASCORBIC ACID) 500 MG tablet Take 500 mg by mouth daily.      No current facility-administered medications for this visit.    SURGICAL HISTORY:  Past Surgical History  Procedure Laterality Date  . Left occipital craniotomy  09/21/2005    tumor  . Left lower lobectomy  05/2001  . Radical neck dissection  10/18/2011    Procedure: RADICAL NECK DISSECTION;  Surgeon: Izora Gala, MD;  Location: [place];  Service: ENT;  Laterality: Right;  RIGHT MODIFIED NECK DISSECTION /POSSIBLE RIGHT THYROIDECTOM  . Thyroidectomy  10/18/2011    Procedure: THYROIDECTOMY WITH RADICAL NECK DISSECTION;  Surgeon: Izora Gala, MD;  Location: [place];  Service: ENT;  Laterality: Right;  . Lung lobectomy    . Neck surgery    . Brain surgery    . Orif ankle fracture Left 10/02/2014    Procedure: OPEN REDUCTION INTERNAL FIXATION (ORIF) ANKLE FRACTURE;  Surgeon: Alta Corning, MD;  Location: [facility];  Service: Orthopedics;  Laterality: Left;    REVIEW OF SYSTEMS:  Constitutional: positive for fatigue Eyes: negative Ears, nose, mouth, throat, and face:  negative Respiratory: negative Cardiovascular: negative Gastrointestinal: negative Genitourinary:negative Integument/breast: negative Hematologic/lymphatic: negative Musculoskeletal:positive for muscle weakness Neurological: negative Behavioral/Psych: negative Endocrine: negative Allergic/Immunologic: negative   PHYSICAL EXAMINATION: General appearance: alert, cooperative, fatigued and no distress Head: Normocephalic, without obvious abnormality, atraumatic Neck: no adenopathy Lymph nodes: Cervical, supraclavicular, and axillary nodes normal. Resp: clear to auscultation bilaterally Cardio: regular rate and rhythm, S1, S2 normal, no murmur, click, rub or gallop GI: soft, non-tender; bowel sounds normal; no masses,  no organomegaly Extremities: extremities normal, atraumatic, no cyanosis or edema Neurologic: Alert and oriented X 3, normal strength and tone. Normal symmetric reflexes. Normal coordination and gait  ECOG PERFORMANCE STATUS: 1 - Symptomatic but completely ambulatory  Blood pressure 102/57, pulse 101, temperature 98.6 F (37 C), temperature source Oral, resp. rate 18, height $RemoveBe'5\' 8"'LmZdkrbRZ$  (1.727 m), weight 193 lb (87.544 kg), SpO2 99 %.  LABORATORY DATA: Lab Results  Component Value Date   WBC 8.9 10/21/2014   HGB 9.7* 10/21/2014   HCT 30.3* 10/21/2014   MCV 89.9 10/21/2014   PLT 275 10/21/2014      Chemistry      Component Value Date/Time   NA 135 10/22/2014 0527   NA 142 02/23/2014 1010   NA 138 02/28/2012 0939   K 3.5 10/22/2014 0527   K 3.6 02/23/2014 1010   K 4.6 02/28/2012 0939   CL 102 10/22/2014 0527   CL 106 12/09/2012 0806   CL 99 02/28/2012 0939   CO2 27 10/22/2014 0527   CO2 27 02/23/2014 1010   CO2 28 02/28/2012 0939   BUN 17 10/22/2014 0527   BUN 13.1 02/23/2014 1010   BUN 12 02/28/2012 0939   CREATININE 0.64 10/22/2014 0527   CREATININE 0.9 02/23/2014 1010   CREATININE 1.0 02/28/2012 0939      Component Value Date/Time   CALCIUM 8.1*  10/22/2014 0527   CALCIUM 10.0 02/23/2014 1010   CALCIUM 9.1 02/28/2012 0939   ALKPHOS 72 10/21/2014 0610   ALKPHOS 71 02/23/2014 1010   ALKPHOS 80 02/28/2012 0939   AST 22 10/21/2014 0610   AST 17 02/23/2014 1010   AST 20 02/28/2012 0939   ALT 24 10/21/2014 0610   ALT 19 02/23/2014 1010  ALT 21 02/28/2012 0939   BILITOT 0.5 10/21/2014 0610   BILITOT 1.33* 02/23/2014 1010   BILITOT 1.30 02/28/2012 0939       RADIOGRAPHIC STUDIES: Ct Chest W Contrast  11/15/2014   CLINICAL DATA:  Metastatic small cell lung cancer to the brain diagnosed multiple times, including 9/14. Chemotherapy in progress. Gamma knife therapy complete in 2008. Radiation therapy complete in 09/2013. Thyroid cancer diagnosed 2/13. No current complaints.  EXAM: CT CHEST WITH CONTRAST  TECHNIQUE: Multidetector CT imaging of the chest was performed during intravenous contrast administration.  CONTRAST:  56mL OMNIPAQUE IOHEXOL 300 MG/ML  SOLN  COMPARISON:  Acute abdomen series of 10/20/2014. Most recent chest CT of 02/16/2014. Abdominal pelvic CT of 10/20/2014 also reviewed.  FINDINGS: Mediastinum/Nodes: No supraclavicular adenopathy. Resolution of previously described enlarging left subpectoral and axillary nodes. Tortuous descending thoracic aorta. Mild cardiomegaly with small pericardial effusion. Likely physiologic. No central pulmonary embolism, on this non-dedicated study. No mediastinal or hilar adenopathy. Esophageal air-fluid level, including on image 24.  Lungs/Pleura: Small left pleural effusion which is similar to on 10/20/2014.  Surgical changes of left lower lobectomy.  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 Similar on the left and slightly improved on the right.  5 mm right upper lobe pulmonary nodule on image 22 is similar.  New dependent atelectasis in the right lower lobe.  A subpleural 3 mm superior segment right lower lobe nodule on image 27 is unchanged.  3 mm left apical pulmonary  nodule on image 9 is similar to slightly decreased in size.  Left upper lobe 5 mm nodule on image 17 measured 6 mm on the prior exam.  Decreased left lower lobe lung volume. Areas of secondary atelectasis identified. The previously described left lower lobe pulmonary nodules are not readily apparent.  No new or enlarging nodules.  Upper abdomen: Normal imaged portions of the liver, spleen, stomach, pancreas, right adrenal gland. Mild left adrenal thickening is unchanged. An incompletely imaged left-sided upper pole caliectasis is similar to on 10/20/2014. Image 56.  Musculoskeletal: Left-sided thoracotomy changes. Displaced sternal body fracture which is new on sagittal image 54. Suspicion of underlying lucent lesion on sagittal image 53. No metastasis in this region on the prior exam.  Sclerosis within the sternal manubrium is similar and favored to be related to a bone island.  Accentuation of expected thoracic kyphosis.  IMPRESSION: 1. Resolution of previously described enlarging left axillary and subpectoral nodes. 2. Similar to decreased size of pulmonary nodules. No new or progressive disease identified within the extraosseous chest. 3. A small left pleural effusion is unchanged. 4. Left lower lobectomy with similar to slight improvement in upper lobe predominant radiation changes. 5. Left upper pole caliectasis is similar to on the 10/20/2014 abdominal study. Incompletely imaged. 6. Since the 01/20/2014, sternal body displaced fracture. Correlate with trauma in this area. Cannot exclude underlying metastasis with pathologic fracture. 7. Esophageal air fluid level suggests dysmotility or gastroesophageal reflux.   Electronically Signed   By: Jeronimo Greaves M.D.   On: 11/15/2014 09:29   Mr Lumbar Spine Wo Contrast  10/20/2014   CLINICAL DATA:  Larey Seat in [employer] parking lot October 08, 2014 injuring lower back. History of L3 and L4 fracture. History of metastatic lung cancer. Recent bowel impaction, urinary  bladder retention, lower extremity weakness, and clinical concern for neurogenic bladder.  EXAM: MRI LUMBAR SPINE WITHOUT CONTRAST  TECHNIQUE: Multiplanar, multisequence MR imaging of the lumbar spine was performed. No intravenous contrast was administered.  COMPARISON:  CT of the abdomen and pelvis October 20, 2014 at 1826 hours and CT of the abdomen and pelvis January 20, 2014  FINDINGS: Using the reference level of the last well-formed intervertebral disc as L5-S1, moderate L4 vertebral body compression fracture with approximately 50% height loss, mild to moderate L5 compression fracture with approximately 20% height loss. These are new from June 2015 though, no STIR signal abnormality to suggest acute fracture. No malalignment. Remaining lumbar vertebral bodies intact. Moderate to severe L5-S1 disc height loss, remaining disc morphology is maintained, decreased T2 signal within the L2-3 through L5-S1 disc consistent with mild desiccation. Mild chronic discogenic endplate changes H8-2 and L4-5, mild subacute to chronic discogenic endplate change at X9-B7.  Conus medullaris terminates at T12-L1 and appears normal in morphology and signal characteristics, partially imaged. Cauda equina is unremarkable. Moderate to severe paraspinal muscle atrophy, with bright interstitial STIR signal suggesting a component denervation. Large amount of stool in the large bowel partially imaged, with mild hydronephrosis bilaterally, incompletely assessed.  Level by level evaluation:  T12-L1, L1-2: No disc bulge, canal stenosis nor neural foraminal narrowing.  L2-3: Small LEFT extra foraminal disc protrusion encroaches upon the exited LEFT L2 nerve. Mild facet arthropathy and ligamentum flavum redundancy. Mild canal stenosis. Mild LEFT neural foraminal narrowing. Tiny LEFT facet synovial cyst extending into the paraspinal soft tissues.  L3-4: 3 mm broad-based disc bulge, moderate facet arthropathy and ligamentum flavum redundancy with trace  facet effusions which are likely reactive. Moderate to severe canal stenosis and trefoil configuration with partial effacement lateral recesses which may affect the traversing L4 nerve. Mild LEFT neural foraminal narrowing. Bilateral LEFT greater than RIGHT subcentimeter synovial facet cysts extend into the paraspinal soft tissues.  L4-5: 3 mm broad-based disc bulge, central annular tear. Mild facet arthropathy and ligamentum flavum redundancy. Mild canal stenosis. Mild bilateral neural foraminal narrowing.  L5-S1: 2 mm annular bulging, mild facet arthropathy and ligamentum flavum redundancy without canal stenosis. Mild RIGHT, mild to moderate LEFT neural foraminal narrowing.  IMPRESSION: Remote moderate L4 and mild to moderate L5 compression fractures. No acute fracture or malalignment.  Degenerative change of the lumbar spine resulting in moderate to severe canal stenosis at L3-4, mild at L2-3 and L3-4.  Neural foraminal narrowing L2-3 through L5-S1: Mild to moderate on the LEFT at L5-S1.  Moderate to severe paraspinal muscle atrophy with a component of suspected denervation.   Electronically Signed   By: Elon Alas   On: 10/20/2014 22:16   Ct Abdomen Pelvis W Contrast  10/20/2014   CLINICAL DATA:  Abdominal pain and distention L1-2 weeks. History of lung cancer.  EXAM: CT ABDOMEN AND PELVIS WITH CONTRAST  TECHNIQUE: Multidetector CT imaging of the abdomen and pelvis was performed using the standard protocol following bolus administration of intravenous contrast.  CONTRAST:  100 mL OMNIPAQUE IOHEXOL 300 MG/ML  SOLN  COMPARISON:  CT chest, abdomen and pelvis 01/20/2014.  FINDINGS: The patient has small bilateral pleural effusions, larger on the left. Small focus of airspace opacity is seen in the left lung base. 0.9 cm nodule in the left lower lobe seen on the comparison examination is not visible on this study and may be obscured. No pleural effusion is identified.  There is massive distention of the  urinary bladder and moderate bilateral hydronephrosis. These findings are new since the comparison CT scan. Question bladder outlet obstruction or neurogenic bladder. Cause for this abnormality is not identified.  The ascending and transverse colon are markedly distended.  Liquid stool is seen in the descending colon. In the sigmoid colon, the wall appears somewhat thickened although this is likely due to fluid surrounding stool rather than true wall thickening.  The gallbladder, liver, spleen, adrenal glands and pancreas appear normal. Small bowel loops are normal in appearance. Subcutaneous edema is present about the pelvis and upper legs bilaterally. No lymphadenopathy is identified.  Since the prior CT scan, the patient has suffered L4 and L5 superior endplate compression fractures. No lytic or sclerotic lesion association with the fractures is identified. Defect in left lower ribs consistent with postoperative change is noted.  IMPRESSION: Massive distention of the urinary bladder and associated moderate bilateral hydronephrosis. Cause for this abnormality is not identified. Question bladder outlet obstruction or neurogenic bladder. This finding is new since the comparison CT scan.  Dilatation of the colon with liquid stool in the descending and sigmoid. No obstruction is identified and dilatation is likely due to colonic ileus.  Small bilateral pleural effusions, larger on the left. Incomplete visualization of airspace opacity in left lung base may reflect atelectasis. Left lower lobe pulmonary nodule seen on the prior chest CT is not visible on this exam and may be obscured.  L4 and L5 superior endplate compression fractures are new since the prior CT. No lytic or sclerotic lesion associated show with the fractures is identified.   Electronically Signed   By: Inge Rise M.D.   On: 10/20/2014 18:59   Mr Ankle Right  Wo Contrast  10/21/2014   CLINICAL DATA:  History of fall 09/30/2014. Right ankle  swelling with a scabbed area on the medial aspect of the ankle. Recent diagnosis of cellulitis.  EXAM: MRI OF THE RIGHT ANKLE WITHOUT CONTRAST  TECHNIQUE: Multiplanar, multisequence MR imaging of the ankle was performed. No intravenous contrast was administered.  COMPARISON:  None.  FINDINGS: Extensive subcutaneous edema is seen about the ankle without focal fluid collection. Edema appears diffuse.  TENDONS  Peroneal: Intact.  Posteromedial: Intact.  Anterior: Intact.  Achilles: Intact.  Plantar Fascia: Unremarkable.  LIGAMENTS  Lateral: Intact.  Medial: Intact.  CARTILAGE  Ankle Joint: Unremarkable.  Subtalar Joints/Sinus Tarsi: Unremarkable.  Bones: There is marrow edema in the tibia eccentric on the lateral side and posteriorly. A small focus of milder marrow edema is seen in the anterior aspect of the lateral, distal tibia. The appearance is most suggestive of contusion rather than osteomyelitis.  IMPRESSION: Extensive subcutaneous edema about the ankle without focal fluid collection is consistent with cellulitis.  Marrow edema in the distal tibia eccentric on the lateral side and posterior is likely secondary to bone contusion given history of fall and reported scab on the medial aspect of the ankle. Although thought much less likely, osteomyelitis cannot be completely excluded.   Electronically Signed   By: Inge Rise M.D.   On: 10/21/2014 08:42   Dg Abd Acute W/chest  10/20/2014   CLINICAL DATA:  Soft tissue mass in the lower abdomen on physical exam. Initial encounter.  EXAM: ACUTE ABDOMEN SERIES (ABDOMEN 2 VIEW & CHEST 1 VIEW)  COMPARISON:  CT abdomen 01/20/2014.  FINDINGS: There is no out acute cardiopulmonary disease. Marked gaseous distension of bowel is present with elevation of the LEFT hemidiaphragm. Although elevation of the LEFT hemidiaphragm has been a chronic finding, this is more pronounced on today's exam. Lung volumes are also lower than on previous studies. Monitoring leads project  over the chest. Postoperative changes are present in the LEFT upper lobe and some of  this accounts for volume loss and elevation of the LEFT hemidiaphragm.  There is no free air under the hemidiaphragms on semi upright imaging. There is gaseous distension of: , with multiple air-fluid levels. Decubitus images were obtained and there is no intra-abdominal free air. Stool is present in the rectosigmoid. Mild dilation of small bowel loops measuring up to 3.3 cm. The cecum measures 10.7 cm and is distended with gas. Cecal position appears within normal limits.  IMPRESSION: 1. Postsurgical changes in the LEFT upper lobe with elevation of the LEFT hemidiaphragm. 2. Small and large bowel dilation with marked gaseous distension and air-fluid levels. The appearance is nonspecific and with both large in small bowel dilation, the findings suggest ileus. Colonic obstruction is considered less likely. 3. No intra-abdominal free air.   Electronically Signed   By: Dereck Ligas M.D.   On: 10/20/2014 15:37   ASSESSMENT AND PLAN: this is a very pleasant 69 years old [place] American female with metastatic non-small cell lung cancer adenocarcinoma, with positive EGFR mutation but also resistant mutation T790M. She has been on treatment with oral Tarceva for more than 6 years.  The patient has EGFR resistant mutation T790M and was treated for the last 8 months with [id number] on a clinical trial at [facility] but this was discontinued secondary to inability to follow-up with the protocol requirement. Her recent CT scan of the chest, abdomen and pelvis showed no significant evidence for disease progression. I recommended for the patient treatment with Tagrisso 80 mg by mouth daily. I discussed with the patient and her husband the adverse effect of this treatment including but not limited to skin rash, diarrhea, interstitial lung disease, liver or renal dysfunction. She is expected to start this treatment in the next few days.  I sent her a prescription to [facility] outpatient pharmacy. I will see her back for follow-up visit in 2 weeks for reevaluation and management of any adverse effect of her treatment. She was advised to call immediately if she has any concerning symptoms in the interval.  The patient voices understanding of current disease status and treatment options and is in agreement with the current care plan.  All questions were answered. The patient knows to call the clinic with any problems, questions or concerns. We can certainly see the patient much sooner if necessary.  Disclaimer: This note was dictated with voice recognition software. Similar sounding words can inadvertently be transcribed and may not be corrected upon review.

## 2014-11-15 NOTE — Progress Notes (Signed)
I faxed prior auth form to [employer] for Tagrisso 80 mg tablet

## 2014-11-15 NOTE — Progress Notes (Unsigned)
tagrisso Prior auth put in [employer].

## 2014-11-15 NOTE — Telephone Encounter (Signed)
I was unable to contact RN at [facility] to go over her med list.

## 2014-11-15 NOTE — Progress Notes (Signed)
I placed form for scrip for Tagrisso on desk of nurse for Dr. Julien Nordmann. It came back needing signature.

## 2014-11-16 ENCOUNTER — Telehealth: Payer: Self-pay | Admitting: Medical Oncology

## 2014-11-16 DIAGNOSIS — I10 Essential (primary) hypertension: Secondary | ICD-10-CM | POA: Diagnosis not present

## 2014-11-16 DIAGNOSIS — M6281 Muscle weakness (generalized): Secondary | ICD-10-CM | POA: Diagnosis not present

## 2014-11-16 DIAGNOSIS — G4700 Insomnia, unspecified: Secondary | ICD-10-CM | POA: Diagnosis not present

## 2014-11-16 DIAGNOSIS — E559 Vitamin D deficiency, unspecified: Secondary | ICD-10-CM | POA: Diagnosis not present

## 2014-11-16 NOTE — Telephone Encounter (Signed)
Fax receipt confirmed.

## 2014-11-17 ENCOUNTER — Encounter: Payer: Self-pay | Admitting: Internal Medicine

## 2014-11-17 ENCOUNTER — Telehealth: Payer: Self-pay | Admitting: Medical Oncology

## 2014-11-17 DIAGNOSIS — R5381 Other malaise: Secondary | ICD-10-CM | POA: Diagnosis not present

## 2014-11-17 DIAGNOSIS — R262 Difficulty in walking, not elsewhere classified: Secondary | ICD-10-CM | POA: Diagnosis not present

## 2014-11-17 NOTE — Progress Notes (Signed)
Placing form for dr. Julien Nordmann on nurse's desk again. prio auth req for [employer].

## 2014-11-17 NOTE — Telephone Encounter (Signed)
Done

## 2014-11-18 ENCOUNTER — Telehealth: Payer: Self-pay | Admitting: Medical Oncology

## 2014-11-18 ENCOUNTER — Encounter: Payer: Self-pay | Admitting: Internal Medicine

## 2014-11-18 NOTE — Progress Notes (Signed)
I placed form from [employer] for Tagrisso on desk of nurse for Dr. Julien Nordmann

## 2014-11-18 NOTE — Telephone Encounter (Signed)
I returned pt call and told her Dr Julien Nordmann had to sign for prior auth and it was faxed back to [facility] yesterday.

## 2014-11-18 NOTE — Telephone Encounter (Signed)
Had to refax [employer] auth and did with prev therapy.

## 2014-11-23 ENCOUNTER — Telehealth: Payer: Self-pay | Admitting: *Deleted

## 2014-11-23 NOTE — Telephone Encounter (Signed)
Patient about Tagrisso pending authorization. Message sent to RN.

## 2014-11-24 ENCOUNTER — Telehealth: Payer: Self-pay | Admitting: *Deleted

## 2014-11-24 DIAGNOSIS — N319 Neuromuscular dysfunction of bladder, unspecified: Secondary | ICD-10-CM | POA: Diagnosis not present

## 2014-11-24 DIAGNOSIS — R338 Other retention of urine: Secondary | ICD-10-CM | POA: Diagnosis not present

## 2014-11-24 NOTE — Telephone Encounter (Signed)
Spoke with representative at [facility]  Who advised Tagrisso 80mg  tablet has been authorized from 10/25/14-11/23/17 Authorization # [id number]  Call placed to [facility] with above information. Called pt advised pharmacy is working on filling medication. Gave pt [facility] #, pt advised she had a recent fall and is in a nursing home.

## 2014-11-25 ENCOUNTER — Encounter: Payer: Self-pay | Admitting: Internal Medicine

## 2014-11-25 NOTE — Progress Notes (Signed)
Per [facility] [place] Nip was approved 10/25/14-11/23/17  CASE# [id number]. I will send to medical records.

## 2014-12-02 ENCOUNTER — Telehealth: Payer: Self-pay | Admitting: Internal Medicine

## 2014-12-02 ENCOUNTER — Ambulatory Visit (HOSPITAL_BASED_OUTPATIENT_CLINIC_OR_DEPARTMENT_OTHER): Payer: Medicare Other | Admitting: Internal Medicine

## 2014-12-02 ENCOUNTER — Encounter: Payer: Self-pay | Admitting: Internal Medicine

## 2014-12-02 ENCOUNTER — Other Ambulatory Visit (HOSPITAL_BASED_OUTPATIENT_CLINIC_OR_DEPARTMENT_OTHER): Payer: Medicare Other

## 2014-12-02 VITALS — BP 119/73 | HR 87 | Temp 98.2°F | Resp 18 | Ht 68.0 in

## 2014-12-02 DIAGNOSIS — C3491 Malignant neoplasm of unspecified part of right bronchus or lung: Secondary | ICD-10-CM | POA: Diagnosis not present

## 2014-12-02 DIAGNOSIS — C7931 Secondary malignant neoplasm of brain: Secondary | ICD-10-CM | POA: Diagnosis not present

## 2014-12-02 LAB — CBC WITH DIFFERENTIAL/PLATELET
BASO%: 0.9 % (ref 0.0–2.0)
Basophils Absolute: 0 10*3/uL (ref 0.0–0.1)
EOS%: 4.6 % (ref 0.0–7.0)
Eosinophils Absolute: 0.2 10*3/uL (ref 0.0–0.5)
HCT: 34.3 % — ABNORMAL LOW (ref 34.8–46.6)
HGB: 10.5 g/dL — ABNORMAL LOW (ref 11.6–15.9)
LYMPH%: 22.4 % (ref 14.0–49.7)
MCH: 26.2 pg (ref 25.1–34.0)
MCHC: 30.6 g/dL — ABNORMAL LOW (ref 31.5–36.0)
MCV: 85.6 fL (ref 79.5–101.0)
MONO#: 0.4 10*3/uL (ref 0.1–0.9)
MONO%: 8.7 % (ref 0.0–14.0)
NEUT#: 3 10*3/uL (ref 1.5–6.5)
NEUT%: 63.4 % (ref 38.4–76.8)
Platelets: 282 10*3/uL (ref 145–400)
RBC: 4 10*6/uL (ref 3.70–5.45)
RDW: 17.7 % — ABNORMAL HIGH (ref 11.2–14.5)
WBC: 4.7 10*3/uL (ref 3.9–10.3)
lymph#: 1.1 10*3/uL (ref 0.9–3.3)

## 2014-12-02 LAB — COMPREHENSIVE METABOLIC PANEL (CC13)
ALT: 16 U/L (ref 0–55)
AST: 17 U/L (ref 5–34)
Albumin: 3.2 g/dL — ABNORMAL LOW (ref 3.5–5.0)
Alkaline Phosphatase: 80 U/L (ref 40–150)
Anion Gap: 8 mEq/L (ref 3–11)
BUN: 10.8 mg/dL (ref 7.0–26.0)
CO2: 28 mEq/L (ref 22–29)
Calcium: 8.7 mg/dL (ref 8.4–10.4)
Chloride: 107 mEq/L (ref 98–109)
Creatinine: 0.7 mg/dL (ref 0.6–1.1)
EGFR: >90 mL/min/{1.73_m2} (ref >90)
Glucose: 81 mg/dl (ref 70–140)
Potassium: 3.9 mEq/L (ref 3.5–5.1)
Sodium: 142 mEq/L (ref 136–145)
Total Bilirubin: 0.27 mg/dL (ref 0.20–1.20)
Total Protein: 6.1 g/dL — ABNORMAL LOW (ref 6.4–8.3)

## 2014-12-02 NOTE — Progress Notes (Signed)
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  BIOMARKERS TESTING ([facility]): Positive for: EGFR E746-A750 del, T790M, CTNNB1 D32G, SMAD4 T158f*10 Negative for: RET, ALK, BRAF, KRAS, ERBB2 and MET  PRIOR THERAPY:  1. Status post left lower lobectomy under the care of Dr BArlyce Dicein October 2002. 2. Status post course of concurrent chemoradiation with weekly carboplatin and paclitaxel under the care of Dr SBenay Spiceand Dr WElba Barmanin early 2003. 3. Status post treatment with Celebrex and [facility] 4. Status post left occipital craniotomy for tumor resection on September 21, 2005, under the care of Dr CChristella Noa 5. Status post whole brain irradiation under the care of Dr WElba Barman completed November 07, 2005. 6. Status post gamma knife [facility] the [facility]cted [facility] 7. Status post right thyroidectomy with radical neck dissection under the care of Dr. RConstance Holsteron 10/18/2011. 8. Status post excisional biopsy of right cervical lymph node that was consistent with metastatic adenocarcinoma. 9. Tarceva 150 mg by mouth daily with therapy beginning 06/03/2007, discontinued in July 2015 secondary to disease progression. Status post 79 cycles. 10. palliative radiotherapy to the enlarging right upper lobe lung nodule under the care of Dr. MValere Dross 11. [id number] 300 mg by mouth daily for 21 days discontinued secondary to study deviation and [facility]. 12. Open reduction and internal  fixation of trimalleolar ankle fracture dislocation with fixation of the fibula as well as medial          malleolus.   CURRENT THERAPY:  1) Tagrisso 80 mg by mouth daily, status post 1 week of treatment.  CHEMOTHERAPY INTENT: palliative  CURRENT # OF CHEMOTHERAPY CYCLES: 1  CURRENT ANTIEMETICS: Compazine when necessary  CURRENT SMOKING STATUS: nonsmoker  ORAL CHEMOTHERAPY AND CONSENT: Tarceva  CURRENT BISPHOSPHONATES USE: None  PAIN MANAGEMENT: None  NARCOTICS INDUCED CONSTIPATION: None  LIVING WILL AND CODE STATUS: Full code  INTERVAL HISTORY: Valerie MEINERS89y.o. female returns to the clinic today for three-month follow up visit accompanied by her husband. The patient is started treatment with Tagrisso 80 mg by mouth daily 1 week ago and she is tolerating her treatment fairly well with no significant adverse effects. She is still a resident of a skilled nursing facility for rehabilitation after her foot fractures. She is feeling fine with no specific complaints except for inability to bear weight on her left foot. She denied having any significant chest pain, shortness of breath, cough or hemoptysis. She denied having any significant nausea or vomiting. She is here today for evaluation and repeat blood work.  MEDICAL HISTORY: Past Medical History  Diagnosis Date  . Heart murmur   . Shortness of breath     hx lung ca   . Arthritis   . Lung cancer dx'd 2002  . Metastasis to brain dx'd 2008  . 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 Anal fistula   . H/O osteoporosis   . H/O vitamin D deficiency   . History of measles, mumps, or rubella   .  H/O varicella   . Hypertension   . Yeast infection   . Abnormal Pap smear 2006  . Atrophic vaginitis 2008  . Dyspareunia 2008  . Dementia 2009  . Osteoporosis 2010  . Status post chemotherapy 2003    CARBOPLATIN/PACLITAXEL /STATUS POST CLINICAL TRAIL OF CELEBREX AND IRESSA AT [facility] FOR 1 YEAR  . Status post radiation  therapy 2003    LEFT LUNG  . Status post radiation therapy 11/07/2005    WHOLE BRAIN: DR Larkin [facility]  . Status post radiation therapy 06/02/2008    GAMMA KNIFE OF RESECTED CAVITAY  . On antineoplastic chemotherapy     TARCEVA  . Nodule of right lung CT- 06/03/12    RIGHT UPPER LOBE  . Thyroid adenoma     ?  [name] Thyroid cancer 10/18/11 bx    adenoid nodules   . Anxiety   . Cataract   . Nodule of right lung 06/03/12    Upper Lobe  . Lung metastasis     PET scan 05/05/13, RUL lung nodule  . History of radiation therapy 07/28/13- 08/10/13    right lung metastasis 5000 cGy 10 sessions  . Lung cancer   . Lung cancer   . Ankle fracture     ALLERGIES:  has No Known Allergies.  MEDICATIONS:  Current Outpatient Prescriptions  Medication Sig Dispense Refill  . alendronate (FOSAMAX) 70 MG tablet Take 1 tablet (70 mg total) by mouth every 7 (seven) days. Saturdays; Take with a full glass of water on an empty stomach. (Patient not taking: Reported on 10/20/2014) 4 tablet 12  . aspirin EC 81 MG tablet Take 81 mg by mouth at bedtime.     . bisacodyl (DULCOLAX) 10 MG suppository Place 10 mg rectally daily as needed for moderate constipation.    . cholecalciferol (VITAMIN D) 1000 UNITS tablet Take 1,000 Units by mouth at bedtime.    . docusate sodium (COLACE) 100 MG capsule Take 1 capsule (100 mg total) by mouth 2 (two) times daily. 10 capsule 0  . donepezil (ARICEPT) 10 MG tablet Take 1 tablet (10 mg total) by mouth daily. (Patient taking differently: Take 10 mg by mouth at bedtime. ) 90 tablet 0  . doxycycline (VIBRA-TABS) 100 MG tablet Take 1 tablet (100 mg total) by mouth every 12 (twelve) hours. 12 tablet 0  . HYDROcodone-acetaminophen (NORCO) 5-325 MG per tablet Take 1 tablet by mouth every 8 (eight) hours as needed for severe pain. 30 tablet 0  . indomethacin (INDOCIN) 50 MG capsule Take 50 mg by mouth every 8 (eight) hours as needed for mild pain or moderate pain.    [name] lactulose (CEPHULAC) 20 G  packet Take 20 g by mouth daily as needed (constipation).    [name] loperamide (IMODIUM) 2 MG capsule Take 2 mg by mouth.    . mirtazapine (REMERON) 15 MG tablet Take 15 mg by mouth at bedtime.    . Multiple Vitamin (MULTI-VITAMINS) TABS Take by mouth.    . Naloxegol Oxalate 25 MG TABS Take 1 tablet by mouth daily.    . naphazoline-pheniramine (NAPHCON-A) 0.025-0.3 % ophthalmic solution Administer 1 drop to both eyes continuous as needed.    [name] osimertinib mesylate (TAGRISSO) 80 MG tablet Take 1 tablet (80 mg total) by mouth daily. 30 tablet 2  . research study medication Take home supply.    . Rivaroxaban (XARELTO) 15 MG TABS tablet Take 15 mg by mouth 2 (two) times daily with a meal.    .  sennosides (SENOKOT) 8.8 MG/5ML syrup Take 30 mLs by mouth at bedtime.    . vitamin B-12 (CYANOCOBALAMIN) 500 MCG tablet Take by mouth.    . vitamin C (ASCORBIC ACID) 500 MG tablet Take 500 mg by mouth daily.     . vitamin E 400 UNIT capsule Take by mouth.     No current facility-administered medications for this visit.    SURGICAL HISTORY:  Past Surgical History  Procedure Laterality Date  . Left occipital craniotomy  09/21/2005    tumor  . Left lower lobectomy  05/2001  . Radical neck dissection  10/18/2011    Procedure: RADICAL NECK DISSECTION;  Surgeon: Izora Gala, MD;  Location: [facility];  Service: ENT;  Laterality: Right;  RIGHT MODIFIED NECK DISSECTION /POSSIBLE RIGHT THYROIDECTOM  . Thyroidectomy  10/18/2011    Procedure: THYROIDECTOMY WITH RADICAL NECK DISSECTION;  Surgeon: Izora Gala, MD;  Location: [facility] [place];  Service: ENT;  Laterality: Right;  . Lung lobectomy    . Neck surgery    . Brain surgery    . Orif ankle fracture Left 10/02/2014    Procedure: OPEN REDUCTION INTERNAL FIXATION (ORIF) ANKLE FRACTURE;  Surgeon: Alta Corning, MD;  Location: [facility];  Service: Orthopedics;  Laterality: Left;    REVIEW OF SYSTEMS:  Constitutional: positive for fatigue Eyes: negative Ears, nose, mouth, throat, and  face: negative Respiratory: negative Cardiovascular: negative Gastrointestinal: negative Genitourinary:negative Integument/breast: negative Hematologic/lymphatic: negative Musculoskeletal:positive for muscle weakness Neurological: negative Behavioral/Psych: negative Endocrine: negative Allergic/Immunologic: negative   PHYSICAL EXAMINATION: General appearance: alert, cooperative, fatigued and no distress Head: Normocephalic, without obvious abnormality, atraumatic Neck: no adenopathy Lymph nodes: Cervical, supraclavicular, and axillary nodes normal. Resp: clear to auscultation bilaterally Cardio: regular rate and rhythm, S1, S2 normal, no murmur, click, rub or gallop GI: soft, non-tender; bowel sounds normal; no masses,  no organomegaly Extremities: extremities normal, atraumatic, no cyanosis or edema Neurologic: Alert and oriented X 3, normal strength and tone. Normal symmetric reflexes. Normal coordination and gait  ECOG PERFORMANCE STATUS: 1 - Symptomatic but completely ambulatory  There were no vitals taken for this visit.  LABORATORY DATA: Lab Results  Component Value Date   WBC 4.7 12/02/2014   HGB 10.5* 12/02/2014   HCT 34.3* 12/02/2014   MCV 85.6 12/02/2014   PLT 282 12/02/2014      Chemistry      Component Value Date/Time   NA 135 10/22/2014 0527   NA 142 02/23/2014 1010   NA 138 02/28/2012 0939   K 3.5 10/22/2014 0527   K 3.6 02/23/2014 1010   K 4.6 02/28/2012 0939   CL 102 10/22/2014 0527   CL 106 12/09/2012 0806   CL 99 02/28/2012 0939   CO2 27 10/22/2014 0527   CO2 27 02/23/2014 1010   CO2 28 02/28/2012 0939   BUN 17 10/22/2014 0527   BUN 13.1 02/23/2014 1010   BUN 12 02/28/2012 0939   CREATININE 0.64 10/22/2014 0527   CREATININE 0.9 02/23/2014 1010   CREATININE 1.0 02/28/2012 0939      Component Value Date/Time   CALCIUM 8.1* 10/22/2014 0527   CALCIUM 10.0 02/23/2014 1010   CALCIUM 9.1 02/28/2012 0939   ALKPHOS 72 10/21/2014 0610   ALKPHOS  71 02/23/2014 1010   ALKPHOS 80 02/28/2012 0939   AST 22 10/21/2014 0610   AST 17 02/23/2014 1010   AST 20 02/28/2012 0939   ALT 24 10/21/2014 0610   ALT 19 02/23/2014 1010   ALT 21 02/28/2012 0939  BILITOT 0.5 10/21/2014 0610   BILITOT 1.33* 02/23/2014 1010   BILITOT 1.30 02/28/2012 0939       RADIOGRAPHIC STUDIES: Ct Chest W Contrast  11/15/2014   CLINICAL DATA:  Metastatic small cell lung cancer to the brain diagnosed multiple times, including 9/14. Chemotherapy in progress. Gamma knife therapy complete in 2008. Radiation therapy complete in 09/2013. Thyroid cancer diagnosed 2/13. No current complaints.  EXAM: CT CHEST WITH CONTRAST  TECHNIQUE: Multidetector CT imaging of the chest was performed during intravenous contrast administration.  CONTRAST:  6m OMNIPAQUE IOHEXOL 300 MG/ML  SOLN  COMPARISON:  Acute abdomen series of 10/20/2014. Most recent chest CT of 02/16/2014. Abdominal pelvic CT of 10/20/2014 also reviewed.  FINDINGS: Mediastinum/Nodes: No supraclavicular adenopathy. Resolution of previously described enlarging left subpectoral and axillary nodes. Tortuous descending thoracic aorta. Mild cardiomegaly with small pericardial effusion. Likely physiologic. No central pulmonary embolism, on this non-dedicated study. No mediastinal or hilar adenopathy. Esophageal air-fluid level, including on image 24.  Lungs/Pleura: Small left pleural effusion which is similar to on 10/20/2014.  Surgical changes of left lower lobectomy.  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 Similar on the left and slightly improved on the right.  5 mm right upper lobe pulmonary nodule on image 22 is similar.  New dependent atelectasis in the right lower lobe.  A subpleural 3 mm superior segment right lower lobe nodule on image 27 is unchanged.  3 mm left apical pulmonary nodule on image 9 is similar to slightly decreased in size.  Left upper lobe 5 mm nodule on image 17 measured 6 mm on  the prior exam.  Decreased left lower lobe lung volume. Areas of secondary atelectasis identified. The previously described left lower lobe pulmonary nodules are not readily apparent.  No new or enlarging nodules.  Upper abdomen: Normal imaged portions of the liver, spleen, stomach, pancreas, right adrenal gland. Mild left adrenal thickening is unchanged. An incompletely imaged left-sided upper pole caliectasis is similar to on 10/20/2014. Image 56.  Musculoskeletal: Left-sided thoracotomy changes. Displaced sternal body fracture which is new on sagittal image 54. Suspicion of underlying lucent lesion on sagittal image 53. No metastasis in this region on the prior exam.  Sclerosis within the sternal manubrium is similar and favored to be related to a bone island.  Accentuation of expected thoracic kyphosis.  IMPRESSION: 1. Resolution of previously described enlarging left axillary and subpectoral nodes. 2. Similar to decreased size of pulmonary nodules. No new or progressive disease identified within the extraosseous chest. 3. A small left pleural effusion is unchanged. 4. Left lower lobectomy with similar to slight improvement in upper lobe predominant radiation changes. 5. Left upper pole caliectasis is similar to on the 10/20/2014 abdominal study. Incompletely imaged. 6. Since the 01/20/2014, sternal body displaced fracture. Correlate with trauma in this area. Cannot exclude underlying metastasis with pathologic fracture. 7. Esophageal air fluid level suggests dysmotility or gastroesophageal reflux.   Electronically Signed   By: KAbigail MiyamotoM.D.   On: 11/15/2014 09:29   ASSESSMENT AND PLAN: this is a very pleasant 69years old [place] female with metastatic non-small cell lung cancer adenocarcinoma, with positive EGFR mutation but also resistant mutation T790M. She has been on treatment with oral Tarceva for more than 6 years.  The patient has EGFR resistant mutation T790M and was treated for the  last 8 months [id number] secondary to inability to follow-up with the protocol  requirement. Her recent CT scan of the chest, abdomen and pelvis showed no significant evidence for disease progression. The patient is currently on Tagrisso 80 mg by mouth daily and tolerating it fairly well with no significant adverse effects. I recommended for her to continue her current treatment with the same dose. I will see her back for follow-up visit in 3 weeks for reevaluation and management of any adverse effects of her treatment. She was advised to call immediately if she has any concerning symptoms in the interval.  The patient voices understanding of current disease status and treatment options and is in agreement with the current care plan.  All questions were answered. The patient knows to call the clinic with any problems, questions or concerns. We can certainly see the patient much sooner if necessary.  Disclaimer: This note was dictated with voice recognition software. Similar sounding words can inadvertently be transcribed and may not be corrected upon review.

## 2014-12-02 NOTE — Telephone Encounter (Signed)
Appointments made and avs printed for patient °

## 2014-12-06 ENCOUNTER — Telehealth: Payer: Self-pay | Admitting: Internal Medicine

## 2014-12-06 NOTE — Telephone Encounter (Signed)
returned call and s.w. pt and confirmed all appts on same day

## 2014-12-07 DIAGNOSIS — R262 Difficulty in walking, not elsewhere classified: Secondary | ICD-10-CM | POA: Diagnosis not present

## 2014-12-07 DIAGNOSIS — Z9889 Other specified postprocedural states: Secondary | ICD-10-CM | POA: Diagnosis not present

## 2014-12-07 DIAGNOSIS — R5381 Other malaise: Secondary | ICD-10-CM | POA: Diagnosis not present

## 2014-12-08 DIAGNOSIS — I10 Essential (primary) hypertension: Secondary | ICD-10-CM | POA: Diagnosis not present

## 2014-12-08 DIAGNOSIS — E559 Vitamin D deficiency, unspecified: Secondary | ICD-10-CM | POA: Diagnosis not present

## 2014-12-08 DIAGNOSIS — I82503 Chronic embolism and thrombosis of unspecified deep veins of lower extremity, bilateral: Secondary | ICD-10-CM | POA: Diagnosis not present

## 2014-12-08 DIAGNOSIS — M84572D Pathological fracture in neoplastic disease, left ankle, subsequent encounter for fracture with routine healing: Secondary | ICD-10-CM | POA: Diagnosis not present

## 2014-12-10 DIAGNOSIS — R319 Hematuria, unspecified: Secondary | ICD-10-CM | POA: Diagnosis not present

## 2014-12-15 DIAGNOSIS — R262 Difficulty in walking, not elsewhere classified: Secondary | ICD-10-CM | POA: Diagnosis not present

## 2014-12-15 DIAGNOSIS — R5381 Other malaise: Secondary | ICD-10-CM | POA: Diagnosis not present

## 2014-12-28 DIAGNOSIS — M84572D Pathological fracture in neoplastic disease, left ankle, subsequent encounter for fracture with routine healing: Secondary | ICD-10-CM | POA: Diagnosis not present

## 2014-12-28 DIAGNOSIS — G4700 Insomnia, unspecified: Secondary | ICD-10-CM | POA: Diagnosis not present

## 2014-12-28 DIAGNOSIS — I10 Essential (primary) hypertension: Secondary | ICD-10-CM | POA: Diagnosis not present

## 2014-12-28 DIAGNOSIS — I82503 Chronic embolism and thrombosis of unspecified deep veins of lower extremity, bilateral: Secondary | ICD-10-CM | POA: Diagnosis not present

## 2015-01-03 ENCOUNTER — Encounter: Payer: Self-pay | Admitting: Internal Medicine

## 2015-01-03 ENCOUNTER — Other Ambulatory Visit (HOSPITAL_BASED_OUTPATIENT_CLINIC_OR_DEPARTMENT_OTHER): Payer: Medicare Other

## 2015-01-03 ENCOUNTER — Telehealth: Payer: Self-pay | Admitting: Internal Medicine

## 2015-01-03 ENCOUNTER — Ambulatory Visit (HOSPITAL_BASED_OUTPATIENT_CLINIC_OR_DEPARTMENT_OTHER): Payer: Medicare Other | Admitting: Internal Medicine

## 2015-01-03 VITALS — BP 135/75 | HR 104 | Temp 97.7°F | Resp 18 | Ht 68.0 in | Wt 197.7 lb

## 2015-01-03 DIAGNOSIS — S82402D Unspecified fracture of shaft of left fibula, subsequent encounter for closed fracture with routine healing: Secondary | ICD-10-CM | POA: Diagnosis not present

## 2015-01-03 DIAGNOSIS — C3432 Malignant neoplasm of lower lobe, left bronchus or lung: Secondary | ICD-10-CM

## 2015-01-03 DIAGNOSIS — C7931 Secondary malignant neoplasm of brain: Secondary | ICD-10-CM | POA: Diagnosis not present

## 2015-01-03 DIAGNOSIS — C3492 Malignant neoplasm of unspecified part of left bronchus or lung: Secondary | ICD-10-CM | POA: Diagnosis not present

## 2015-01-03 DIAGNOSIS — S82852D Displaced trimalleolar fracture of left lower leg, subsequent encounter for closed fracture with routine healing: Secondary | ICD-10-CM | POA: Diagnosis not present

## 2015-01-03 DIAGNOSIS — S82102D Unspecified fracture of upper end of left tibia, subsequent encounter for closed fracture with routine healing: Secondary | ICD-10-CM | POA: Diagnosis not present

## 2015-01-03 DIAGNOSIS — C3491 Malignant neoplasm of unspecified part of right bronchus or lung: Secondary | ICD-10-CM

## 2015-01-03 DIAGNOSIS — C7989 Secondary malignant neoplasm of other specified sites: Secondary | ICD-10-CM | POA: Diagnosis not present

## 2015-01-03 LAB — CBC WITH DIFFERENTIAL/PLATELET
BASO%: 0.3 % (ref 0.0–2.0)
Basophils Absolute: 0 10*3/uL (ref 0.0–0.1)
EOS%: 5.4 % (ref 0.0–7.0)
Eosinophils Absolute: 0.3 10*3/uL (ref 0.0–0.5)
HCT: 37.8 % (ref 34.8–46.6)
HGB: 11.6 g/dL (ref 11.6–15.9)
LYMPH%: 16.3 % (ref 14.0–49.7)
MCH: 26.7 pg (ref 25.1–34.0)
MCHC: 30.7 g/dL — ABNORMAL LOW (ref 31.5–36.0)
MCV: 86.9 fL (ref 79.5–101.0)
MONO#: 0.5 10*3/uL (ref 0.1–0.9)
MONO%: 9.1 % (ref 0.0–14.0)
NEUT#: 4.1 10*3/uL (ref 1.5–6.5)
NEUT%: 68.9 % (ref 38.4–76.8)
Platelets: 207 10*3/uL (ref 145–400)
RBC: 4.35 10*6/uL (ref 3.70–5.45)
RDW: 16.6 % — ABNORMAL HIGH (ref 11.2–14.5)
WBC: 5.9 10*3/uL (ref 3.9–10.3)
lymph#: 1 10*3/uL (ref 0.9–3.3)

## 2015-01-03 LAB — COMPREHENSIVE METABOLIC PANEL (CC13)
ALT: 12 U/L (ref 0–55)
AST: 16 U/L (ref 5–34)
Albumin: 3.6 g/dL (ref 3.5–5.0)
Alkaline Phosphatase: 88 U/L (ref 40–150)
Anion Gap: 11 mEq/L (ref 3–11)
BUN: 8.1 mg/dL (ref 7.0–26.0)
CO2: 25 mEq/L (ref 22–29)
Calcium: 9.6 mg/dL (ref 8.4–10.4)
Chloride: 107 mEq/L (ref 98–109)
Creatinine: 0.9 mg/dL (ref 0.6–1.1)
EGFR: 81 mL/min/{1.73_m2} — ABNORMAL LOW (ref >90)
Glucose: 93 mg/dl (ref 70–140)
Potassium: 3.9 mEq/L (ref 3.5–5.1)
Sodium: 143 mEq/L (ref 136–145)
Total Bilirubin: 0.4 mg/dL (ref 0.20–1.20)
Total Protein: 6.7 g/dL (ref 6.4–8.3)

## 2015-01-03 MED ORDER — XARELTO 20 MG PO TABS: 20.0000 mg | ORAL_TABLET | Freq: Every day | ORAL | Status: DC

## 2015-01-03 NOTE — Progress Notes (Signed)
[place] [phone]   [phone]  OFFICE PROGRESS NOTE  [name], MD [address] [place] [address]  DIAGNOSIS: Metastatic non-small cell lung cancer with brain metastasis, diagnosed initially a stage IIB adenocarcinoma in October 2002.  BIOMARKERS TESTING (Foundation One): Positive for: EGFR [id number] del, T790M, CTNNB1 D32G, SMAD4 T140fs*10 Negative for: RET, ALK, BRAF, KRAS, ERBB2 and MET  PRIOR THERAPY:  1. Status post left lower lobectomy under the care of Dr Arlyce Dice in October 2002. 2. Status post course of concurrent chemoradiation with weekly carboplatin and paclitaxel under the care of Dr Benay Spice and Dr Elba Barman in early 2003. 3. Status post treatment with Celebrex and Iressa according to the clinical trial at [facility] for a total of 1 year. 4. Status post left occipital craniotomy for tumor resection on September 21, 2005, under the care of Dr Christella Noa. 5. Status post whole brain irradiation under the care of Dr Elba Barman, completed November 07, 2005. 6. Status post gamma knife treatment to the resected cavitary recurrence on June 02, 2008, at [facility]. 7. Status post right thyroidectomy with radical neck dissection under the care of Dr. Constance Holster on 10/18/2011. 8. Status post excisional biopsy of right cervical lymph node that was consistent with metastatic adenocarcinoma. 9. Tarceva 150 mg by mouth daily with therapy beginning 06/03/2007, discontinued in July 2015 secondary to disease progression. Status post 79 cycles. 10. palliative radiotherapy to the enlarging right upper lobe lung nodule under the care of Dr. Valere Dross. 11. Treatment at [facility] on STUDY [id number]  300 mg by mouth daily for 21 days discontinued secondary to study deviation and inability for the patient to keep her appointment at [facility] secondary to recent hip fractures. 12. Open reduction and internal  fixation of trimalleolar ankle fracture dislocation with fixation of the fibula as well as medial          malleolus.   CURRENT THERAPY:  1) Tagrisso 80 mg by mouth daily, started 11/25/2014, status post 1 month of treatment.  CHEMOTHERAPY INTENT: palliative  CURRENT # OF CHEMOTHERAPY CYCLES: 2  CURRENT ANTIEMETICS: Compazine when necessary  CURRENT SMOKING STATUS: nonsmoker  ORAL CHEMOTHERAPY AND CONSENT: Tarceva  CURRENT BISPHOSPHONATES USE: None  PAIN MANAGEMENT: None  NARCOTICS INDUCED CONSTIPATION: None  LIVING WILL AND CODE STATUS: Full code  INTERVAL HISTORY: Valerie [name] 69 y.o. female returns to the clinic today for follow up visit accompanied by her husband. The patient is started treatment with Tagrisso 80 mg by mouth daily 5 weeks ago and she is tolerating her treatment fairly well with no significant adverse effects. She is now home and recovering slowly but improving every day. She uses a walker to help with the stability. She is feeling fine with no specific complaints except for inability to bear weight on her left foot. She denied having any significant chest pain, shortness of breath, cough or hemoptysis. She denied having any significant nausea or vomiting. She is here today for evaluation and repeat blood work.  MEDICAL HISTORY: Past Medical History  Diagnosis Date  . Heart murmur   . Shortness of breath     hx lung ca   . Arthritis   . Lung cancer dx'd 2002  . Metastasis to brain dx'd 2008  . 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 Anal fistula   . H/O osteoporosis   . H/O vitamin D deficiency   . History  of measles, mumps, or rubella   . H/O varicella   . Hypertension   . Yeast infection   . Abnormal Pap smear 2006  . Atrophic vaginitis 2008  . Dyspareunia 2008  . Dementia 2009  . Osteoporosis 2010  . Status post chemotherapy 2003    CARBOPLATIN/PACLITAXEL /STATUS POST CLINICAL TRAIL OF CELEBREX AND IRESSA AT [facility] FOR 1 YEAR  .  Status post radiation therapy 2003    LEFT LUNG  . Status post radiation therapy 11/07/2005    WHOLE BRAIN: DR Larkin [facility]  . Status post radiation therapy 06/02/2008    GAMMA KNIFE OF RESECTED CAVITAY  . On antineoplastic chemotherapy     TARCEVA  . Nodule of right lung CT- 06/03/12    RIGHT UPPER LOBE  . Thyroid adenoma     ?  [name] Thyroid cancer 10/18/11 bx    adenoid nodules   . Anxiety   . Cataract   . Nodule of right lung 06/03/12    Upper Lobe  . Lung metastasis     PET scan 05/05/13, RUL lung nodule  . History of radiation therapy 07/28/13- 08/10/13    right lung metastasis 5000 cGy 10 sessions  . Lung cancer   . Lung cancer   . Ankle fracture     ALLERGIES:  has No Known Allergies.  MEDICATIONS:  Current Outpatient Prescriptions  Medication Sig Dispense Refill  . alendronate (FOSAMAX) 70 MG tablet Take 1 tablet (70 mg total) by mouth every 7 (seven) days. Saturdays; Take with a full glass of water on an empty stomach. 4 tablet 12  . aspirin EC 81 MG tablet Take 81 mg by mouth at bedtime.     . bisacodyl (DULCOLAX) 10 MG suppository Place 10 mg rectally daily as needed for moderate constipation.    . cholecalciferol (VITAMIN D) 1000 UNITS tablet Take 1,000 Units by mouth at bedtime.    . docusate sodium (COLACE) 100 MG capsule Take 1 capsule (100 mg total) by mouth 2 (two) times daily. 10 capsule 0  . loperamide (IMODIUM) 2 MG capsule Take 2 mg by mouth.    . mirtazapine (REMERON) 15 MG tablet Take 15 mg by mouth at bedtime.    . Multiple Vitamin (MULTI-VITAMINS) TABS Take by mouth.    . Naloxegol Oxalate 25 MG TABS Take 1 tablet by mouth daily.    . naphazoline-pheniramine (NAPHCON-A) 0.025-0.3 % ophthalmic solution Administer 1 drop to both eyes continuous as needed.    [name] osimertinib mesylate (TAGRISSO) 80 MG tablet Take 1 tablet (80 mg total) by mouth daily. 30 tablet 2  . research study medication Take home supply.    . Rivaroxaban (XARELTO) 15 MG TABS tablet Take 15 mg  by mouth 2 (two) times daily with a meal.    . sennosides (SENOKOT) 8.8 MG/5ML syrup Take 30 mLs by mouth at bedtime.    . tamsulosin (FLOMAX) 0.4 MG CAPS capsule Take 0.4 mg by mouth.    . vitamin B-12 (CYANOCOBALAMIN) 500 MCG tablet Take by mouth.    . vitamin C (ASCORBIC ACID) 500 MG tablet Take 500 mg by mouth daily.     . vitamin E 400 UNIT capsule Take by mouth.    [name] 20 MG TABS tablet Take 20 mg by mouth daily.  0   No current facility-administered medications for this visit.    SURGICAL HISTORY:  Past Surgical History  Procedure Laterality Date  . Left occipital craniotomy  09/21/2005  tumor  . Left lower lobectomy  05/2001  . Radical neck dissection  10/18/2011    Procedure: RADICAL NECK DISSECTION;  Surgeon: Izora Gala, MD;  Location: [place];  Service: ENT;  Laterality: Right;  RIGHT MODIFIED NECK DISSECTION /POSSIBLE RIGHT THYROIDECTOM  . Thyroidectomy  10/18/2011    Procedure: THYROIDECTOMY WITH RADICAL NECK DISSECTION;  Surgeon: Izora Gala, MD;  Location: [place];  Service: ENT;  Laterality: Right;  . Lung lobectomy    . Neck surgery    . Brain surgery    . Orif ankle fracture Left 10/02/2014    Procedure: OPEN REDUCTION INTERNAL FIXATION (ORIF) ANKLE FRACTURE;  Surgeon: Alta [place], MD;  Location: [facility];  Service: Orthopedics;  Laterality: Left;    REVIEW OF SYSTEMS:  Constitutional: positive for fatigue Eyes: negative Ears, nose, mouth, throat, and face: negative Respiratory: negative Cardiovascular: negative Gastrointestinal: negative Genitourinary:negative Integument/breast: negative Hematologic/lymphatic: negative Musculoskeletal:positive for muscle weakness Neurological: negative Behavioral/Psych: negative Endocrine: negative Allergic/Immunologic: negative   PHYSICAL EXAMINATION: General appearance: alert, cooperative, fatigued and no distress Head: Normocephalic, without obvious abnormality, atraumatic Neck: no adenopathy Lymph nodes: Cervical,  supraclavicular, and axillary nodes normal. Resp: clear to auscultation bilaterally Cardio: regular rate and rhythm, S1, S2 normal, no murmur, click, rub or gallop GI: soft, non-tender; bowel sounds normal; no masses,  no organomegaly Extremities: extremities normal, atraumatic, no cyanosis or edema Neurologic: Alert and oriented X 3, normal strength and tone. Normal symmetric reflexes. Normal coordination and gait  ECOG PERFORMANCE STATUS: 1 - Symptomatic but completely ambulatory  Blood pressure 135/75, pulse 104, temperature 97.7 F (36.5 C), temperature source Axillary, resp. rate 18, height $RemoveBe'5\' 8"'xtiCnEevD$  (1.727 m), weight 197 lb 11.2 oz (89.676 kg), SpO2 100 %.  LABORATORY DATA: Lab Results  Component Value Date   WBC 5.9 01/03/2015   HGB 11.6 01/03/2015   HCT 37.8 01/03/2015   MCV 86.9 01/03/2015   PLT 207 01/03/2015      Chemistry      Component Value Date/Time   NA 142 12/02/2014 0951   NA 135 10/22/2014 0527   NA 138 02/28/2012 0939   K 3.9 12/02/2014 0951   K 3.5 10/22/2014 0527   K 4.6 02/28/2012 0939   CL 102 10/22/2014 0527   CL 106 12/09/2012 0806   CL 99 02/28/2012 0939   CO2 28 12/02/2014 0951   CO2 27 10/22/2014 0527   CO2 28 02/28/2012 0939   BUN 10.8 12/02/2014 0951   BUN 17 10/22/2014 0527   BUN 12 02/28/2012 0939   CREATININE 0.7 12/02/2014 0951   CREATININE 0.64 10/22/2014 0527   CREATININE 1.0 02/28/2012 0939      Component Value Date/Time   CALCIUM 8.7 12/02/2014 0951   CALCIUM 8.1* 10/22/2014 0527   CALCIUM 9.1 02/28/2012 0939   ALKPHOS 80 12/02/2014 0951   ALKPHOS 72 10/21/2014 0610   ALKPHOS 80 02/28/2012 0939   AST 17 12/02/2014 0951   AST 22 10/21/2014 0610   AST 20 02/28/2012 0939   ALT 16 12/02/2014 0951   ALT 24 10/21/2014 0610   ALT 21 02/28/2012 0939   BILITOT 0.27 12/02/2014 0951   BILITOT 0.5 10/21/2014 0610   BILITOT 1.30 02/28/2012 0939       RADIOGRAPHIC STUDIES: No results found. ASSESSMENT AND PLAN: this is a very  pleasant 69 years old African American female with metastatic non-small cell lung cancer adenocarcinoma, with positive EGFR mutation but also resistant mutation T790M. She has been on treatment with oral Tarceva  for more than 6 years.  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 but this was discontinued secondary to inability to follow-up with the protocol requirement. Her recent CT scan of the chest, abdomen and pelvis showed no significant evidence for disease progression. The patient is currently on Tagrisso 80 mg by mouth daily and tolerating it fairly well with no significant adverse effects. I recommended for her to continue her current treatment with the same dose. She requested a refill of Xarelto. I will see her back for follow-up visit in 4 weeks for reevaluation and management of any adverse effects of her treatment. She was advised to call immediately if she has any concerning symptoms in the interval.  The patient voices understanding of current disease status and treatment options and is in agreement with the current care plan.  All questions were answered. The patient knows to call the clinic with any problems, questions or concerns. We can certainly see the patient much sooner if necessary.  Disclaimer: This note was dictated with voice recognition software. Similar sounding words can inadvertently be transcribed and may not be corrected upon review.

## 2015-01-03 NOTE — Telephone Encounter (Signed)
Gave and printed appt sched and avs for pt for JUNE °

## 2015-01-04 DIAGNOSIS — S82852D Displaced trimalleolar fracture of left lower leg, subsequent encounter for closed fracture with routine healing: Secondary | ICD-10-CM | POA: Diagnosis not present

## 2015-01-04 DIAGNOSIS — C3492 Malignant neoplasm of unspecified part of left bronchus or lung: Secondary | ICD-10-CM | POA: Diagnosis not present

## 2015-01-04 DIAGNOSIS — C7989 Secondary malignant neoplasm of other specified sites: Secondary | ICD-10-CM | POA: Diagnosis not present

## 2015-01-04 DIAGNOSIS — M79671 Pain in right foot: Secondary | ICD-10-CM | POA: Diagnosis not present

## 2015-01-04 DIAGNOSIS — S82402D Unspecified fracture of shaft of left fibula, subsequent encounter for closed fracture with routine healing: Secondary | ICD-10-CM | POA: Diagnosis not present

## 2015-01-04 DIAGNOSIS — C7931 Secondary malignant neoplasm of brain: Secondary | ICD-10-CM | POA: Diagnosis not present

## 2015-01-04 DIAGNOSIS — M25572 Pain in left ankle and joints of left foot: Secondary | ICD-10-CM | POA: Diagnosis not present

## 2015-01-04 DIAGNOSIS — Z9889 Other specified postprocedural states: Secondary | ICD-10-CM | POA: Diagnosis not present

## 2015-01-04 DIAGNOSIS — M25571 Pain in right ankle and joints of right foot: Secondary | ICD-10-CM | POA: Diagnosis not present

## 2015-01-04 DIAGNOSIS — S82102D Unspecified fracture of upper end of left tibia, subsequent encounter for closed fracture with routine healing: Secondary | ICD-10-CM | POA: Diagnosis not present

## 2015-01-07 DIAGNOSIS — C3492 Malignant neoplasm of unspecified part of left bronchus or lung: Secondary | ICD-10-CM | POA: Diagnosis not present

## 2015-01-07 DIAGNOSIS — C7989 Secondary malignant neoplasm of other specified sites: Secondary | ICD-10-CM | POA: Diagnosis not present

## 2015-01-07 DIAGNOSIS — S82102D Unspecified fracture of upper end of left tibia, subsequent encounter for closed fracture with routine healing: Secondary | ICD-10-CM | POA: Diagnosis not present

## 2015-01-07 DIAGNOSIS — S82852D Displaced trimalleolar fracture of left lower leg, subsequent encounter for closed fracture with routine healing: Secondary | ICD-10-CM | POA: Diagnosis not present

## 2015-01-07 DIAGNOSIS — S82402D Unspecified fracture of shaft of left fibula, subsequent encounter for closed fracture with routine healing: Secondary | ICD-10-CM | POA: Diagnosis not present

## 2015-01-07 DIAGNOSIS — C7931 Secondary malignant neoplasm of brain: Secondary | ICD-10-CM | POA: Diagnosis not present

## 2015-01-10 DIAGNOSIS — S82402D Unspecified fracture of shaft of left fibula, subsequent encounter for closed fracture with routine healing: Secondary | ICD-10-CM | POA: Diagnosis not present

## 2015-01-10 DIAGNOSIS — C7989 Secondary malignant neoplasm of other specified sites: Secondary | ICD-10-CM | POA: Diagnosis not present

## 2015-01-10 DIAGNOSIS — C7931 Secondary malignant neoplasm of brain: Secondary | ICD-10-CM | POA: Diagnosis not present

## 2015-01-10 DIAGNOSIS — C3492 Malignant neoplasm of unspecified part of left bronchus or lung: Secondary | ICD-10-CM | POA: Diagnosis not present

## 2015-01-10 DIAGNOSIS — S82852D Displaced trimalleolar fracture of left lower leg, subsequent encounter for closed fracture with routine healing: Secondary | ICD-10-CM | POA: Diagnosis not present

## 2015-01-10 DIAGNOSIS — S82102D Unspecified fracture of upper end of left tibia, subsequent encounter for closed fracture with routine healing: Secondary | ICD-10-CM | POA: Diagnosis not present

## 2015-01-11 DIAGNOSIS — C7931 Secondary malignant neoplasm of brain: Secondary | ICD-10-CM | POA: Diagnosis not present

## 2015-01-11 DIAGNOSIS — N390 Urinary tract infection, site not specified: Secondary | ICD-10-CM | POA: Diagnosis not present

## 2015-01-11 DIAGNOSIS — S82852D Displaced trimalleolar fracture of left lower leg, subsequent encounter for closed fracture with routine healing: Secondary | ICD-10-CM | POA: Diagnosis not present

## 2015-01-11 DIAGNOSIS — S82102D Unspecified fracture of upper end of left tibia, subsequent encounter for closed fracture with routine healing: Secondary | ICD-10-CM | POA: Diagnosis not present

## 2015-01-11 DIAGNOSIS — C7989 Secondary malignant neoplasm of other specified sites: Secondary | ICD-10-CM | POA: Diagnosis not present

## 2015-01-11 DIAGNOSIS — C3492 Malignant neoplasm of unspecified part of left bronchus or lung: Secondary | ICD-10-CM | POA: Diagnosis not present

## 2015-01-11 DIAGNOSIS — S82402D Unspecified fracture of shaft of left fibula, subsequent encounter for closed fracture with routine healing: Secondary | ICD-10-CM | POA: Diagnosis not present

## 2015-01-12 DIAGNOSIS — S82102D Unspecified fracture of upper end of left tibia, subsequent encounter for closed fracture with routine healing: Secondary | ICD-10-CM | POA: Diagnosis not present

## 2015-01-12 DIAGNOSIS — S82402D Unspecified fracture of shaft of left fibula, subsequent encounter for closed fracture with routine healing: Secondary | ICD-10-CM | POA: Diagnosis not present

## 2015-01-12 DIAGNOSIS — C7931 Secondary malignant neoplasm of brain: Secondary | ICD-10-CM | POA: Diagnosis not present

## 2015-01-12 DIAGNOSIS — C3492 Malignant neoplasm of unspecified part of left bronchus or lung: Secondary | ICD-10-CM | POA: Diagnosis not present

## 2015-01-12 DIAGNOSIS — C7989 Secondary malignant neoplasm of other specified sites: Secondary | ICD-10-CM | POA: Diagnosis not present

## 2015-01-12 DIAGNOSIS — S82852D Displaced trimalleolar fracture of left lower leg, subsequent encounter for closed fracture with routine healing: Secondary | ICD-10-CM | POA: Diagnosis not present

## 2015-01-13 DIAGNOSIS — C7989 Secondary malignant neoplasm of other specified sites: Secondary | ICD-10-CM | POA: Diagnosis not present

## 2015-01-13 DIAGNOSIS — S82402D Unspecified fracture of shaft of left fibula, subsequent encounter for closed fracture with routine healing: Secondary | ICD-10-CM | POA: Diagnosis not present

## 2015-01-13 DIAGNOSIS — C7931 Secondary malignant neoplasm of brain: Secondary | ICD-10-CM | POA: Diagnosis not present

## 2015-01-13 DIAGNOSIS — S82102D Unspecified fracture of upper end of left tibia, subsequent encounter for closed fracture with routine healing: Secondary | ICD-10-CM | POA: Diagnosis not present

## 2015-01-13 DIAGNOSIS — S82852D Displaced trimalleolar fracture of left lower leg, subsequent encounter for closed fracture with routine healing: Secondary | ICD-10-CM | POA: Diagnosis not present

## 2015-01-13 DIAGNOSIS — C3492 Malignant neoplasm of unspecified part of left bronchus or lung: Secondary | ICD-10-CM | POA: Diagnosis not present

## 2015-01-14 DIAGNOSIS — S82102D Unspecified fracture of upper end of left tibia, subsequent encounter for closed fracture with routine healing: Secondary | ICD-10-CM | POA: Diagnosis not present

## 2015-01-14 DIAGNOSIS — C7931 Secondary malignant neoplasm of brain: Secondary | ICD-10-CM | POA: Diagnosis not present

## 2015-01-14 DIAGNOSIS — C7989 Secondary malignant neoplasm of other specified sites: Secondary | ICD-10-CM | POA: Diagnosis not present

## 2015-01-14 DIAGNOSIS — C3492 Malignant neoplasm of unspecified part of left bronchus or lung: Secondary | ICD-10-CM | POA: Diagnosis not present

## 2015-01-14 DIAGNOSIS — S82852D Displaced trimalleolar fracture of left lower leg, subsequent encounter for closed fracture with routine healing: Secondary | ICD-10-CM | POA: Diagnosis not present

## 2015-01-14 DIAGNOSIS — S82402D Unspecified fracture of shaft of left fibula, subsequent encounter for closed fracture with routine healing: Secondary | ICD-10-CM | POA: Diagnosis not present

## 2015-01-18 DIAGNOSIS — S82852D Displaced trimalleolar fracture of left lower leg, subsequent encounter for closed fracture with routine healing: Secondary | ICD-10-CM | POA: Diagnosis not present

## 2015-01-18 DIAGNOSIS — S82102D Unspecified fracture of upper end of left tibia, subsequent encounter for closed fracture with routine healing: Secondary | ICD-10-CM | POA: Diagnosis not present

## 2015-01-18 DIAGNOSIS — S82402D Unspecified fracture of shaft of left fibula, subsequent encounter for closed fracture with routine healing: Secondary | ICD-10-CM | POA: Diagnosis not present

## 2015-01-18 DIAGNOSIS — C7931 Secondary malignant neoplasm of brain: Secondary | ICD-10-CM | POA: Diagnosis not present

## 2015-01-18 DIAGNOSIS — C7989 Secondary malignant neoplasm of other specified sites: Secondary | ICD-10-CM | POA: Diagnosis not present

## 2015-01-18 DIAGNOSIS — C3492 Malignant neoplasm of unspecified part of left bronchus or lung: Secondary | ICD-10-CM | POA: Diagnosis not present

## 2015-01-19 DIAGNOSIS — S82402D Unspecified fracture of shaft of left fibula, subsequent encounter for closed fracture with routine healing: Secondary | ICD-10-CM | POA: Diagnosis not present

## 2015-01-19 DIAGNOSIS — S82852D Displaced trimalleolar fracture of left lower leg, subsequent encounter for closed fracture with routine healing: Secondary | ICD-10-CM | POA: Diagnosis not present

## 2015-01-19 DIAGNOSIS — S82102D Unspecified fracture of upper end of left tibia, subsequent encounter for closed fracture with routine healing: Secondary | ICD-10-CM | POA: Diagnosis not present

## 2015-01-19 DIAGNOSIS — C7931 Secondary malignant neoplasm of brain: Secondary | ICD-10-CM | POA: Diagnosis not present

## 2015-01-19 DIAGNOSIS — C3492 Malignant neoplasm of unspecified part of left bronchus or lung: Secondary | ICD-10-CM | POA: Diagnosis not present

## 2015-01-19 DIAGNOSIS — C7989 Secondary malignant neoplasm of other specified sites: Secondary | ICD-10-CM | POA: Diagnosis not present

## 2015-01-20 DIAGNOSIS — S82402D Unspecified fracture of shaft of left fibula, subsequent encounter for closed fracture with routine healing: Secondary | ICD-10-CM | POA: Diagnosis not present

## 2015-01-20 DIAGNOSIS — S82852D Displaced trimalleolar fracture of left lower leg, subsequent encounter for closed fracture with routine healing: Secondary | ICD-10-CM | POA: Diagnosis not present

## 2015-01-20 DIAGNOSIS — C7931 Secondary malignant neoplasm of brain: Secondary | ICD-10-CM | POA: Diagnosis not present

## 2015-01-20 DIAGNOSIS — N390 Urinary tract infection, site not specified: Secondary | ICD-10-CM | POA: Diagnosis not present

## 2015-01-20 DIAGNOSIS — C7989 Secondary malignant neoplasm of other specified sites: Secondary | ICD-10-CM | POA: Diagnosis not present

## 2015-01-20 DIAGNOSIS — C3492 Malignant neoplasm of unspecified part of left bronchus or lung: Secondary | ICD-10-CM | POA: Diagnosis not present

## 2015-01-20 DIAGNOSIS — S82102D Unspecified fracture of upper end of left tibia, subsequent encounter for closed fracture with routine healing: Secondary | ICD-10-CM | POA: Diagnosis not present

## 2015-01-21 DIAGNOSIS — S82102D Unspecified fracture of upper end of left tibia, subsequent encounter for closed fracture with routine healing: Secondary | ICD-10-CM | POA: Diagnosis not present

## 2015-01-21 DIAGNOSIS — C3492 Malignant neoplasm of unspecified part of left bronchus or lung: Secondary | ICD-10-CM | POA: Diagnosis not present

## 2015-01-21 DIAGNOSIS — S82402D Unspecified fracture of shaft of left fibula, subsequent encounter for closed fracture with routine healing: Secondary | ICD-10-CM | POA: Diagnosis not present

## 2015-01-21 DIAGNOSIS — C7989 Secondary malignant neoplasm of other specified sites: Secondary | ICD-10-CM | POA: Diagnosis not present

## 2015-01-21 DIAGNOSIS — S82852D Displaced trimalleolar fracture of left lower leg, subsequent encounter for closed fracture with routine healing: Secondary | ICD-10-CM | POA: Diagnosis not present

## 2015-01-21 DIAGNOSIS — C7931 Secondary malignant neoplasm of brain: Secondary | ICD-10-CM | POA: Diagnosis not present

## 2015-01-24 DIAGNOSIS — S82402D Unspecified fracture of shaft of left fibula, subsequent encounter for closed fracture with routine healing: Secondary | ICD-10-CM | POA: Diagnosis not present

## 2015-01-24 DIAGNOSIS — C3492 Malignant neoplasm of unspecified part of left bronchus or lung: Secondary | ICD-10-CM | POA: Diagnosis not present

## 2015-01-24 DIAGNOSIS — S82102D Unspecified fracture of upper end of left tibia, subsequent encounter for closed fracture with routine healing: Secondary | ICD-10-CM | POA: Diagnosis not present

## 2015-01-24 DIAGNOSIS — S82852D Displaced trimalleolar fracture of left lower leg, subsequent encounter for closed fracture with routine healing: Secondary | ICD-10-CM | POA: Diagnosis not present

## 2015-01-24 DIAGNOSIS — C7931 Secondary malignant neoplasm of brain: Secondary | ICD-10-CM | POA: Diagnosis not present

## 2015-01-24 DIAGNOSIS — C7989 Secondary malignant neoplasm of other specified sites: Secondary | ICD-10-CM | POA: Diagnosis not present

## 2015-01-25 DIAGNOSIS — C7989 Secondary malignant neoplasm of other specified sites: Secondary | ICD-10-CM | POA: Diagnosis not present

## 2015-01-25 DIAGNOSIS — S82402D Unspecified fracture of shaft of left fibula, subsequent encounter for closed fracture with routine healing: Secondary | ICD-10-CM | POA: Diagnosis not present

## 2015-01-25 DIAGNOSIS — C3492 Malignant neoplasm of unspecified part of left bronchus or lung: Secondary | ICD-10-CM | POA: Diagnosis not present

## 2015-01-25 DIAGNOSIS — C7931 Secondary malignant neoplasm of brain: Secondary | ICD-10-CM | POA: Diagnosis not present

## 2015-01-25 DIAGNOSIS — S82852D Displaced trimalleolar fracture of left lower leg, subsequent encounter for closed fracture with routine healing: Secondary | ICD-10-CM | POA: Diagnosis not present

## 2015-01-25 DIAGNOSIS — S82102D Unspecified fracture of upper end of left tibia, subsequent encounter for closed fracture with routine healing: Secondary | ICD-10-CM | POA: Diagnosis not present

## 2015-01-26 DIAGNOSIS — R197 Diarrhea, unspecified: Secondary | ICD-10-CM | POA: Diagnosis not present

## 2015-01-26 DIAGNOSIS — S82102D Unspecified fracture of upper end of left tibia, subsequent encounter for closed fracture with routine healing: Secondary | ICD-10-CM | POA: Diagnosis not present

## 2015-01-26 DIAGNOSIS — Z79899 Other long term (current) drug therapy: Secondary | ICD-10-CM | POA: Diagnosis not present

## 2015-01-26 DIAGNOSIS — C7989 Secondary malignant neoplasm of other specified sites: Secondary | ICD-10-CM | POA: Diagnosis not present

## 2015-01-26 DIAGNOSIS — S82402D Unspecified fracture of shaft of left fibula, subsequent encounter for closed fracture with routine healing: Secondary | ICD-10-CM | POA: Diagnosis not present

## 2015-01-26 DIAGNOSIS — R350 Frequency of micturition: Secondary | ICD-10-CM | POA: Diagnosis not present

## 2015-01-26 DIAGNOSIS — C7931 Secondary malignant neoplasm of brain: Secondary | ICD-10-CM | POA: Diagnosis not present

## 2015-01-26 DIAGNOSIS — C3492 Malignant neoplasm of unspecified part of left bronchus or lung: Secondary | ICD-10-CM | POA: Diagnosis not present

## 2015-01-26 DIAGNOSIS — S82852D Displaced trimalleolar fracture of left lower leg, subsequent encounter for closed fracture with routine healing: Secondary | ICD-10-CM | POA: Diagnosis not present

## 2015-01-27 DIAGNOSIS — R197 Diarrhea, unspecified: Secondary | ICD-10-CM | POA: Diagnosis not present

## 2015-01-27 DIAGNOSIS — K625 Hemorrhage of anus and rectum: Secondary | ICD-10-CM | POA: Diagnosis not present

## 2015-01-27 DIAGNOSIS — Z1211 Encounter for screening for malignant neoplasm of colon: Secondary | ICD-10-CM | POA: Diagnosis not present

## 2015-01-28 DIAGNOSIS — S82402D Unspecified fracture of shaft of left fibula, subsequent encounter for closed fracture with routine healing: Secondary | ICD-10-CM | POA: Diagnosis not present

## 2015-01-28 DIAGNOSIS — C7989 Secondary malignant neoplasm of other specified sites: Secondary | ICD-10-CM | POA: Diagnosis not present

## 2015-01-28 DIAGNOSIS — C3492 Malignant neoplasm of unspecified part of left bronchus or lung: Secondary | ICD-10-CM | POA: Diagnosis not present

## 2015-01-28 DIAGNOSIS — S82852D Displaced trimalleolar fracture of left lower leg, subsequent encounter for closed fracture with routine healing: Secondary | ICD-10-CM | POA: Diagnosis not present

## 2015-01-28 DIAGNOSIS — C7931 Secondary malignant neoplasm of brain: Secondary | ICD-10-CM | POA: Diagnosis not present

## 2015-01-28 DIAGNOSIS — S82102D Unspecified fracture of upper end of left tibia, subsequent encounter for closed fracture with routine healing: Secondary | ICD-10-CM | POA: Diagnosis not present

## 2015-01-31 ENCOUNTER — Telehealth: Payer: Self-pay | Admitting: Internal Medicine

## 2015-01-31 ENCOUNTER — Encounter: Payer: Self-pay | Admitting: Internal Medicine

## 2015-01-31 ENCOUNTER — Other Ambulatory Visit (HOSPITAL_BASED_OUTPATIENT_CLINIC_OR_DEPARTMENT_OTHER): Payer: Medicare Other

## 2015-01-31 ENCOUNTER — Ambulatory Visit (HOSPITAL_BASED_OUTPATIENT_CLINIC_OR_DEPARTMENT_OTHER): Payer: Medicare Other | Admitting: Internal Medicine

## 2015-01-31 VITALS — BP 136/80 | HR 89 | Temp 97.5°F | Resp 18 | Ht 68.0 in | Wt 192.1 lb

## 2015-01-31 DIAGNOSIS — C7931 Secondary malignant neoplasm of brain: Secondary | ICD-10-CM | POA: Diagnosis not present

## 2015-01-31 DIAGNOSIS — C3492 Malignant neoplasm of unspecified part of left bronchus or lung: Secondary | ICD-10-CM | POA: Diagnosis not present

## 2015-01-31 DIAGNOSIS — S82852D Displaced trimalleolar fracture of left lower leg, subsequent encounter for closed fracture with routine healing: Secondary | ICD-10-CM | POA: Diagnosis not present

## 2015-01-31 DIAGNOSIS — C3491 Malignant neoplasm of unspecified part of right bronchus or lung: Secondary | ICD-10-CM

## 2015-01-31 DIAGNOSIS — C7989 Secondary malignant neoplasm of other specified sites: Secondary | ICD-10-CM | POA: Diagnosis not present

## 2015-01-31 DIAGNOSIS — S82402D Unspecified fracture of shaft of left fibula, subsequent encounter for closed fracture with routine healing: Secondary | ICD-10-CM | POA: Diagnosis not present

## 2015-01-31 DIAGNOSIS — S82102D Unspecified fracture of upper end of left tibia, subsequent encounter for closed fracture with routine healing: Secondary | ICD-10-CM | POA: Diagnosis not present

## 2015-01-31 DIAGNOSIS — C3432 Malignant neoplasm of lower lobe, left bronchus or lung: Secondary | ICD-10-CM

## 2015-01-31 DIAGNOSIS — Z5111 Encounter for antineoplastic chemotherapy: Secondary | ICD-10-CM

## 2015-01-31 DIAGNOSIS — C3490 Malignant neoplasm of unspecified part of unspecified bronchus or lung: Secondary | ICD-10-CM

## 2015-01-31 LAB — CBC WITH DIFFERENTIAL/PLATELET
BASO%: 0.4 % (ref 0.0–2.0)
Basophils Absolute: 0 10*3/uL (ref 0.0–0.1)
EOS%: 6.8 % (ref 0.0–7.0)
Eosinophils Absolute: 0.4 10*3/uL (ref 0.0–0.5)
HCT: 38.9 % (ref 34.8–46.6)
HGB: 12 g/dL (ref 11.6–15.9)
LYMPH%: 17.4 % (ref 14.0–49.7)
MCH: 26.5 pg (ref 25.1–34.0)
MCHC: 30.8 g/dL — ABNORMAL LOW (ref 31.5–36.0)
MCV: 86.1 fL (ref 79.5–101.0)
MONO#: 0.5 10*3/uL (ref 0.1–0.9)
MONO%: 8.3 % (ref 0.0–14.0)
NEUT#: 3.7 10*3/uL (ref 1.5–6.5)
NEUT%: 67.1 % (ref 38.4–76.8)
Platelets: 226 10*3/uL (ref 145–400)
RBC: 4.52 10*6/uL (ref 3.70–5.45)
RDW: 16.5 % — ABNORMAL HIGH (ref 11.2–14.5)
WBC: 5.5 10*3/uL (ref 3.9–10.3)
lymph#: 1 10*3/uL (ref 0.9–3.3)

## 2015-01-31 LAB — COMPREHENSIVE METABOLIC PANEL (CC13)
ALT: 10 U/L (ref 0–55)
AST: 15 U/L (ref 5–34)
Albumin: 3.8 g/dL (ref 3.5–5.0)
Alkaline Phosphatase: 69 U/L (ref 40–150)
Anion Gap: 9 mEq/L (ref 3–11)
BUN: 8.3 mg/dL (ref 7.0–26.0)
CO2: 23 mEq/L (ref 22–29)
Calcium: 9.6 mg/dL (ref 8.4–10.4)
Chloride: 106 mEq/L (ref 98–109)
Creatinine: 0.9 mg/dL (ref 0.6–1.1)
EGFR: 75 mL/min/{1.73_m2} — ABNORMAL LOW (ref >90)
Glucose: 103 mg/dl (ref 70–140)
Potassium: 4.1 mEq/L (ref 3.5–5.1)
Sodium: 139 mEq/L (ref 136–145)
Total Bilirubin: 0.49 mg/dL (ref 0.20–1.20)
Total Protein: 6.8 g/dL (ref 6.4–8.3)

## 2015-01-31 NOTE — Progress Notes (Signed)
[facility] [phone]   Fax:(336) [phone]  OFFICE PROGRESS NOTE  [name], MD [address] [place] [address]  DIAGNOSIS: Metastatic non-small cell lung cancer with brain metastasis, diagnosed initially a stage IIB adenocarcinoma in October 2002.  BIOMARKERS TESTING (Foundation One): Positive for: EGFR E746-A750 del, T790M, CTNNB1 D32G, SMAD4 T125fs*10 Negative for: RET, ALK, BRAF, KRAS, ERBB2 and MET  PRIOR THERAPY:  1. Status post left lower lobectomy under the care of Dr Edwyna Shell in October 2002. 2. Status post course of concurrent chemoradiation with weekly carboplatin and paclitaxel under the care of Dr Truett Perna and Dr Dorna Bloom in early 2003. 3. Status post treatment with Celebrex and Iressa according to the clinical trial at [facility] for a total of 1 year. 4. Status post left occipital craniotomy for tumor resection on September 21, 2005, under the care of Dr Franky Macho. 5. Status post whole brain irradiation under the care of Dr Dorna Bloom, completed November 07, 2005. 6. Status post gamma knife treatment to the resected cavitary recurrence on June 02, 2008, at [facility]. 7. Status post right thyroidectomy with radical neck dissection under the care of Dr. Pollyann Kennedy on 10/18/2011. 8. Status post excisional biopsy of right cervical lymph node that was consistent with metastatic adenocarcinoma. 9. Tarceva 150 mg by mouth daily with therapy beginning 06/03/2007, discontinued in July 2015 secondary to disease progression. Status post 79 cycles. 10. palliative radiotherapy to the enlarging right upper lobe lung nodule under the care of Dr. Dayton Scrape. 11. Treatment at [facility] on STUDY [id number]  300 mg by mouth daily for 21 days discontinued secondary to study deviation and inability for the patient to keep her appointment at [facility] secondary to recent hip fractures. 12. Open reduction and internal  fixation of trimalleolar ankle fracture dislocation with fixation of the fibula as well as medial          malleolus.   CURRENT THERAPY:  1) Tagrisso 80 mg by mouth daily, started 11/25/2014, status post 2 months of treatment.  CHEMOTHERAPY INTENT: palliative  CURRENT # OF CHEMOTHERAPY CYCLES: 3  CURRENT ANTIEMETICS: Compazine when necessary  CURRENT SMOKING STATUS: nonsmoker  ORAL CHEMOTHERAPY AND CONSENT: Tarceva  CURRENT BISPHOSPHONATES USE: None  PAIN MANAGEMENT: None  NARCOTICS INDUCED CONSTIPATION: None  LIVING WILL AND CODE STATUS: Full code  INTERVAL HISTORY: Valerie [name] 69 y.o. female returns to the clinic today for follow up visit accompanied by her daughter. The patient is started treatment with Tagrisso 80 mg by mouth daily 2 month ago and she is tolerating her treatment fairly well with no significant adverse effects. She denied having any significant skin rash or diarrhea. She is not using the wheelchair anymore and she came walking to the clinic. She is feeling fine with no specific complaints except for inability to bear weight on her left foot. She denied having any significant chest pain, shortness of breath, cough or hemoptysis. She denied having any significant nausea or vomiting. She is here today for evaluation and repeat blood work.  MEDICAL HISTORY: Past Medical History  Diagnosis Date  . Heart murmur   . Shortness of breath     hx lung ca   . Arthritis   . Lung cancer dx'd 2002  . Metastasis to brain dx'd 2008  . 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 Anal fistula   . H/O osteoporosis   . H/O vitamin D deficiency   .  History of measles, mumps, or rubella   . H/O varicella   . Hypertension   . Yeast infection   . Abnormal Pap smear 2006  . Atrophic vaginitis 2008  . Dyspareunia 2008  . Dementia 2009  . Osteoporosis 2010  . Status post chemotherapy 2003    CARBOPLATIN/PACLITAXEL /STATUS POST CLINICAL TRAIL OF CELEBREX AND IRESSA  AT [facility] FOR 1 YEAR  . Status post radiation therapy 2003    LEFT LUNG  . Status post radiation therapy 11/07/2005    WHOLE BRAIN: DR Larkin [facility]  . Status post radiation therapy 06/02/2008    GAMMA KNIFE OF RESECTED CAVITAY  . On antineoplastic chemotherapy     TARCEVA  . Nodule of right lung CT- 06/03/12    RIGHT UPPER LOBE  . Thyroid adenoma     ?  [name] Thyroid cancer 10/18/11 bx    adenoid nodules   . Anxiety   . Cataract   . Nodule of right lung 06/03/12    Upper Lobe  . Lung metastasis     PET scan 05/05/13, RUL lung nodule  . History of radiation therapy 07/28/13- 08/10/13    right lung metastasis 5000 cGy 10 sessions  . Lung cancer   . Lung cancer   . Ankle fracture     ALLERGIES:  has No Known Allergies.  MEDICATIONS:  Current Outpatient Prescriptions  Medication Sig Dispense Refill  . alendronate (FOSAMAX) 70 MG tablet Take 1 tablet (70 mg total) by mouth every 7 (seven) days. Saturdays; Take with a full glass of water on an empty stomach. 4 tablet 12  . aspirin EC 81 MG tablet Take 81 mg by mouth at bedtime.     . bisacodyl (DULCOLAX) 10 MG suppository Place 10 mg rectally daily as needed for moderate constipation.    . cholecalciferol (VITAMIN D) 1000 UNITS tablet Take 1,000 Units by mouth at bedtime.    . docusate sodium (COLACE) 100 MG capsule Take 1 capsule (100 mg total) by mouth 2 (two) times daily. 10 capsule 0  . donepezil (ARICEPT) 10 MG tablet Take 10 mg by mouth daily.  0  . loperamide (IMODIUM) 2 MG capsule Take 2 mg by mouth.    . Multiple Vitamin (MULTI-VITAMINS) TABS Take by mouth.    . naphazoline-pheniramine (NAPHCON-A) 0.025-0.3 % ophthalmic solution Administer 1 drop to both eyes continuous as needed.    [name] osimertinib mesylate (TAGRISSO) 80 MG tablet Take 1 tablet (80 mg total) by mouth daily. 30 tablet 2  . research study medication Take home supply.    . tamsulosin (FLOMAX) 0.4 MG CAPS capsule Take 0.4 mg by mouth.    [name] 20 MG TABS tablet  Take 1 tablet (20 mg total) by mouth daily. 30 tablet 0   No current facility-administered medications for this visit.    SURGICAL HISTORY:  Past Surgical History  Procedure Laterality Date  . Left occipital craniotomy  09/21/2005    tumor  . Left lower lobectomy  05/2001  . Radical neck dissection  10/18/2011    Procedure: RADICAL NECK DISSECTION;  Surgeon: Izora Gala, MD;  Location: [place];  Service: ENT;  Laterality: Right;  RIGHT MODIFIED NECK DISSECTION /POSSIBLE RIGHT THYROIDECTOM  . Thyroidectomy  10/18/2011    Procedure: THYROIDECTOMY WITH RADICAL NECK DISSECTION;  Surgeon: Izora Gala, MD;  Location: [place];  Service: ENT;  Laterality: Right;  . Lung lobectomy    . Neck surgery    . Brain surgery    .  Orif ankle fracture Left 10/02/2014    Procedure: OPEN REDUCTION INTERNAL FIXATION (ORIF) ANKLE FRACTURE;  Surgeon: Alta [place], MD;  Location: [facility];  Service: Orthopedics;  Laterality: Left;    REVIEW OF SYSTEMS:  Constitutional: positive for fatigue Eyes: negative Ears, nose, mouth, throat, and face: negative Respiratory: negative Cardiovascular: negative Gastrointestinal: negative Genitourinary:negative Integument/breast: negative Hematologic/lymphatic: negative Musculoskeletal:positive for muscle weakness Neurological: negative Behavioral/Psych: negative Endocrine: negative Allergic/Immunologic: negative   PHYSICAL EXAMINATION: General appearance: alert, cooperative, fatigued and no distress Head: Normocephalic, without obvious abnormality, atraumatic Neck: no adenopathy Lymph nodes: Cervical, supraclavicular, and axillary nodes normal. Resp: clear to auscultation bilaterally Cardio: regular rate and rhythm, S1, S2 normal, no murmur, click, rub or gallop GI: soft, non-tender; bowel sounds normal; no masses,  no organomegaly Extremities: extremities normal, atraumatic, no cyanosis or edema Neurologic: Alert and oriented X 3, normal strength and tone. Normal  symmetric reflexes. Normal coordination and gait  ECOG PERFORMANCE STATUS: 1 - Symptomatic but completely ambulatory  Blood pressure 136/80, pulse 89, temperature 97.5 F (36.4 C), temperature source Oral, resp. rate 18, height $RemoveBe'5\' 8"'WQwyroyLq$  (1.727 m), weight 192 lb 1.6 oz (87.136 kg), SpO2 99 %.  LABORATORY DATA: Lab Results  Component Value Date   WBC 5.5 01/31/2015   HGB 12.0 01/31/2015   HCT 38.9 01/31/2015   MCV 86.1 01/31/2015   PLT 226 01/31/2015      Chemistry      Component Value Date/Time   NA 139 01/31/2015 1345   NA 135 10/22/2014 0527   NA 138 02/28/2012 0939   K 4.1 01/31/2015 1345   K 3.5 10/22/2014 0527   K 4.6 02/28/2012 0939   CL 102 10/22/2014 0527   CL 106 12/09/2012 0806   CL 99 02/28/2012 0939   CO2 23 01/31/2015 1345   CO2 27 10/22/2014 0527   CO2 28 02/28/2012 0939   BUN 8.3 01/31/2015 1345   BUN 17 10/22/2014 0527   BUN 12 02/28/2012 0939   CREATININE 0.9 01/31/2015 1345   CREATININE 0.64 10/22/2014 0527   CREATININE 1.0 02/28/2012 0939      Component Value Date/Time   CALCIUM 9.6 01/03/2015 0944   CALCIUM 8.1* 10/22/2014 0527   CALCIUM 9.1 02/28/2012 0939   ALKPHOS 88 01/03/2015 0944   ALKPHOS 72 10/21/2014 0610   ALKPHOS 80 02/28/2012 0939   AST 16 01/03/2015 0944   AST 22 10/21/2014 0610   AST 20 02/28/2012 0939   ALT 12 01/03/2015 0944   ALT 24 10/21/2014 0610   ALT 21 02/28/2012 0939   BILITOT 0.40 01/03/2015 0944   BILITOT 0.5 10/21/2014 0610   BILITOT 1.30 02/28/2012 0939       RADIOGRAPHIC STUDIES: No results found. ASSESSMENT AND PLAN: this is a very pleasant 69 years old African American female with metastatic non-small cell lung cancer adenocarcinoma, with positive EGFR mutation but also resistant mutation T790M. She has been on treatment with oral Tarceva for more than 6 years.  The patient has EGFR resistant mutation T790M and was treated for the last 8 months with [id number] on a clinical trial at [facility] but this was  discontinued secondary to inability to follow-up with the protocol requirement. She had no evidence for disease progression on that treatment. The patient is currently on Tagrisso 80 mg by mouth daily status post 2 months and tolerating it fairly well with no significant adverse effects. I recommended for her to continue her current treatment with the same dose. I will  see her back for follow-up visit in 4 weeks for reevaluation after repeating CT scan of the chest, abdomen and pelvis for restaging of her disease. She was advised to call immediately if she has any concerning symptoms in the interval.  The patient voices understanding of current disease status and treatment options and is in agreement with the current care plan.  All questions were answered. The patient knows to call the clinic with any problems, questions or concerns. We can certainly see the patient much sooner if necessary.  Disclaimer: This note was dictated with voice recognition software. Similar sounding words can inadvertently be transcribed and may not be corrected upon review.

## 2015-01-31 NOTE — Telephone Encounter (Signed)
per pof to sch pt appt-gave pt copy of avs-pt req to come 2 weeks b4 pof req due to leaving on cruise

## 2015-02-01 DIAGNOSIS — C7989 Secondary malignant neoplasm of other specified sites: Secondary | ICD-10-CM | POA: Diagnosis not present

## 2015-02-01 DIAGNOSIS — S82402D Unspecified fracture of shaft of left fibula, subsequent encounter for closed fracture with routine healing: Secondary | ICD-10-CM | POA: Diagnosis not present

## 2015-02-01 DIAGNOSIS — C7931 Secondary malignant neoplasm of brain: Secondary | ICD-10-CM | POA: Diagnosis not present

## 2015-02-01 DIAGNOSIS — C3492 Malignant neoplasm of unspecified part of left bronchus or lung: Secondary | ICD-10-CM | POA: Diagnosis not present

## 2015-02-01 DIAGNOSIS — S82852D Displaced trimalleolar fracture of left lower leg, subsequent encounter for closed fracture with routine healing: Secondary | ICD-10-CM | POA: Diagnosis not present

## 2015-02-01 DIAGNOSIS — S82102D Unspecified fracture of upper end of left tibia, subsequent encounter for closed fracture with routine healing: Secondary | ICD-10-CM | POA: Diagnosis not present

## 2015-02-02 DIAGNOSIS — C7989 Secondary malignant neoplasm of other specified sites: Secondary | ICD-10-CM | POA: Diagnosis not present

## 2015-02-02 DIAGNOSIS — S82402D Unspecified fracture of shaft of left fibula, subsequent encounter for closed fracture with routine healing: Secondary | ICD-10-CM | POA: Diagnosis not present

## 2015-02-02 DIAGNOSIS — S82852D Displaced trimalleolar fracture of left lower leg, subsequent encounter for closed fracture with routine healing: Secondary | ICD-10-CM | POA: Diagnosis not present

## 2015-02-02 DIAGNOSIS — C3492 Malignant neoplasm of unspecified part of left bronchus or lung: Secondary | ICD-10-CM | POA: Diagnosis not present

## 2015-02-02 DIAGNOSIS — C7931 Secondary malignant neoplasm of brain: Secondary | ICD-10-CM | POA: Diagnosis not present

## 2015-02-02 DIAGNOSIS — S82102D Unspecified fracture of upper end of left tibia, subsequent encounter for closed fracture with routine healing: Secondary | ICD-10-CM | POA: Diagnosis not present

## 2015-02-04 DIAGNOSIS — C3492 Malignant neoplasm of unspecified part of left bronchus or lung: Secondary | ICD-10-CM | POA: Diagnosis not present

## 2015-02-04 DIAGNOSIS — S82402D Unspecified fracture of shaft of left fibula, subsequent encounter for closed fracture with routine healing: Secondary | ICD-10-CM | POA: Diagnosis not present

## 2015-02-04 DIAGNOSIS — C7931 Secondary malignant neoplasm of brain: Secondary | ICD-10-CM | POA: Diagnosis not present

## 2015-02-04 DIAGNOSIS — C7989 Secondary malignant neoplasm of other specified sites: Secondary | ICD-10-CM | POA: Diagnosis not present

## 2015-02-04 DIAGNOSIS — S82102D Unspecified fracture of upper end of left tibia, subsequent encounter for closed fracture with routine healing: Secondary | ICD-10-CM | POA: Diagnosis not present

## 2015-02-04 DIAGNOSIS — S82852D Displaced trimalleolar fracture of left lower leg, subsequent encounter for closed fracture with routine healing: Secondary | ICD-10-CM | POA: Diagnosis not present

## 2015-02-07 ENCOUNTER — Telehealth: Payer: Self-pay | Admitting: *Deleted

## 2015-02-07 NOTE — Telephone Encounter (Signed)
TC from [facility] clarifying date of expected CT scans of chest, abd, pelvis. Per Dr. Worthy Flank not-he wants scan prior to her next appt with him which is scheduled for 03/02/15. She has lab appt on 02/15/15. Vickii Chafe stated she will work on scheduling the scans.

## 2015-02-08 DIAGNOSIS — S82402D Unspecified fracture of shaft of left fibula, subsequent encounter for closed fracture with routine healing: Secondary | ICD-10-CM | POA: Diagnosis not present

## 2015-02-08 DIAGNOSIS — S82852D Displaced trimalleolar fracture of left lower leg, subsequent encounter for closed fracture with routine healing: Secondary | ICD-10-CM | POA: Diagnosis not present

## 2015-02-08 DIAGNOSIS — C7989 Secondary malignant neoplasm of other specified sites: Secondary | ICD-10-CM | POA: Diagnosis not present

## 2015-02-08 DIAGNOSIS — S82102D Unspecified fracture of upper end of left tibia, subsequent encounter for closed fracture with routine healing: Secondary | ICD-10-CM | POA: Diagnosis not present

## 2015-02-08 DIAGNOSIS — C3492 Malignant neoplasm of unspecified part of left bronchus or lung: Secondary | ICD-10-CM | POA: Diagnosis not present

## 2015-02-08 DIAGNOSIS — C7931 Secondary malignant neoplasm of brain: Secondary | ICD-10-CM | POA: Diagnosis not present

## 2015-02-10 DIAGNOSIS — C7931 Secondary malignant neoplasm of brain: Secondary | ICD-10-CM | POA: Diagnosis not present

## 2015-02-10 DIAGNOSIS — S82402D Unspecified fracture of shaft of left fibula, subsequent encounter for closed fracture with routine healing: Secondary | ICD-10-CM | POA: Diagnosis not present

## 2015-02-10 DIAGNOSIS — C3492 Malignant neoplasm of unspecified part of left bronchus or lung: Secondary | ICD-10-CM | POA: Diagnosis not present

## 2015-02-10 DIAGNOSIS — S82852D Displaced trimalleolar fracture of left lower leg, subsequent encounter for closed fracture with routine healing: Secondary | ICD-10-CM | POA: Diagnosis not present

## 2015-02-10 DIAGNOSIS — S82102D Unspecified fracture of upper end of left tibia, subsequent encounter for closed fracture with routine healing: Secondary | ICD-10-CM | POA: Diagnosis not present

## 2015-02-10 DIAGNOSIS — C7989 Secondary malignant neoplasm of other specified sites: Secondary | ICD-10-CM | POA: Diagnosis not present

## 2015-02-11 ENCOUNTER — Telehealth: Payer: Self-pay | Admitting: *Deleted

## 2015-02-11 ENCOUNTER — Other Ambulatory Visit: Payer: Self-pay | Admitting: Internal Medicine

## 2015-02-11 DIAGNOSIS — S82402D Unspecified fracture of shaft of left fibula, subsequent encounter for closed fracture with routine healing: Secondary | ICD-10-CM | POA: Diagnosis not present

## 2015-02-11 DIAGNOSIS — C7931 Secondary malignant neoplasm of brain: Secondary | ICD-10-CM | POA: Diagnosis not present

## 2015-02-11 DIAGNOSIS — S82102D Unspecified fracture of upper end of left tibia, subsequent encounter for closed fracture with routine healing: Secondary | ICD-10-CM | POA: Diagnosis not present

## 2015-02-11 DIAGNOSIS — C3492 Malignant neoplasm of unspecified part of left bronchus or lung: Secondary | ICD-10-CM | POA: Diagnosis not present

## 2015-02-11 DIAGNOSIS — S82852D Displaced trimalleolar fracture of left lower leg, subsequent encounter for closed fracture with routine healing: Secondary | ICD-10-CM | POA: Diagnosis not present

## 2015-02-11 DIAGNOSIS — C7989 Secondary malignant neoplasm of other specified sites: Secondary | ICD-10-CM | POA: Diagnosis not present

## 2015-02-11 NOTE — Telephone Encounter (Signed)
TC from patient requesting refill on Tagrisso from [facility]. She intends to pick up on 02/15/15 when she is here for scan/labs

## 2015-02-12 ENCOUNTER — Other Ambulatory Visit: Payer: Self-pay | Admitting: Internal Medicine

## 2015-02-12 DIAGNOSIS — C3491 Malignant neoplasm of unspecified part of right bronchus or lung: Secondary | ICD-10-CM

## 2015-02-12 MED ORDER — OSIMERTINIB MESYLATE 80 MG PO TABS
80.0000 mg | ORAL_TABLET | Freq: Every day | ORAL | Status: DC
Start: 2015-02-12 — End: 2015-05-30

## 2015-02-14 DIAGNOSIS — C3492 Malignant neoplasm of unspecified part of left bronchus or lung: Secondary | ICD-10-CM | POA: Diagnosis not present

## 2015-02-14 DIAGNOSIS — S82102D Unspecified fracture of upper end of left tibia, subsequent encounter for closed fracture with routine healing: Secondary | ICD-10-CM | POA: Diagnosis not present

## 2015-02-14 DIAGNOSIS — S82402D Unspecified fracture of shaft of left fibula, subsequent encounter for closed fracture with routine healing: Secondary | ICD-10-CM | POA: Diagnosis not present

## 2015-02-14 DIAGNOSIS — C7989 Secondary malignant neoplasm of other specified sites: Secondary | ICD-10-CM | POA: Diagnosis not present

## 2015-02-14 DIAGNOSIS — S82852D Displaced trimalleolar fracture of left lower leg, subsequent encounter for closed fracture with routine healing: Secondary | ICD-10-CM | POA: Diagnosis not present

## 2015-02-14 DIAGNOSIS — C7931 Secondary malignant neoplasm of brain: Secondary | ICD-10-CM | POA: Diagnosis not present

## 2015-02-15 ENCOUNTER — Other Ambulatory Visit (HOSPITAL_BASED_OUTPATIENT_CLINIC_OR_DEPARTMENT_OTHER): Payer: Medicare Other

## 2015-02-15 ENCOUNTER — Ambulatory Visit (HOSPITAL_COMMUNITY)
Admission: RE | Admit: 2015-02-15 | Discharge: 2015-02-15 | Disposition: A | Discharge status: Home / Self Care | Payer: Medicare Other | Source: Ambulatory Visit | Attending: Internal Medicine | Admitting: Internal Medicine

## 2015-02-15 ENCOUNTER — Encounter (HOSPITAL_COMMUNITY): Payer: Self-pay

## 2015-02-15 DIAGNOSIS — Z923 Personal history of irradiation: Secondary | ICD-10-CM | POA: Diagnosis not present

## 2015-02-15 DIAGNOSIS — Z85850 Personal history of malignant neoplasm of thyroid: Secondary | ICD-10-CM | POA: Insufficient documentation

## 2015-02-15 DIAGNOSIS — C3432 Malignant neoplasm of lower lobe, left bronchus or lung: Secondary | ICD-10-CM | POA: Diagnosis present

## 2015-02-15 DIAGNOSIS — C3491 Malignant neoplasm of unspecified part of right bronchus or lung: Secondary | ICD-10-CM

## 2015-02-15 DIAGNOSIS — C3490 Malignant neoplasm of unspecified part of unspecified bronchus or lung: Secondary | ICD-10-CM | POA: Diagnosis not present

## 2015-02-15 DIAGNOSIS — J90 Pleural effusion, not elsewhere classified: Secondary | ICD-10-CM | POA: Diagnosis not present

## 2015-02-15 DIAGNOSIS — I722 Aneurysm of renal artery: Secondary | ICD-10-CM | POA: Diagnosis not present

## 2015-02-15 LAB — CBC WITH DIFFERENTIAL/PLATELET
BASO%: 0.7 % (ref 0.0–2.0)
Basophils Absolute: 0 10*3/uL (ref 0.0–0.1)
EOS%: 4 % (ref 0.0–7.0)
Eosinophils Absolute: 0.2 10*3/uL (ref 0.0–0.5)
HCT: 40.1 % (ref 34.8–46.6)
HGB: 12.4 g/dL (ref 11.6–15.9)
LYMPH%: 16.2 % (ref 14.0–49.7)
MCH: 26 pg (ref 25.1–34.0)
MCHC: 31 g/dL — ABNORMAL LOW (ref 31.5–36.0)
MCV: 83.8 fL (ref 79.5–101.0)
MONO#: 0.5 10*3/uL (ref 0.1–0.9)
MONO%: 9.4 % (ref 0.0–14.0)
NEUT#: 3.9 10*3/uL (ref 1.5–6.5)
NEUT%: 69.7 % (ref 38.4–76.8)
Platelets: 232 10*3/uL (ref 145–400)
RBC: 4.78 10*6/uL (ref 3.70–5.45)
RDW: 17.5 % — ABNORMAL HIGH (ref 11.2–14.5)
WBC: 5.5 10*3/uL (ref 3.9–10.3)
lymph#: 0.9 10*3/uL (ref 0.9–3.3)

## 2015-02-15 LAB — COMPREHENSIVE METABOLIC PANEL (CC13)
ALT: 10 U/L (ref 0–55)
AST: 14 U/L (ref 5–34)
Albumin: 4 g/dL (ref 3.5–5.0)
Alkaline Phosphatase: 66 U/L (ref 40–150)
Anion Gap: 7 mEq/L (ref 3–11)
BUN: 13.1 mg/dL (ref 7.0–26.0)
CO2: 27 mEq/L (ref 22–29)
Calcium: 9.3 mg/dL (ref 8.4–10.4)
Chloride: 104 mEq/L (ref 98–109)
Creatinine: 0.8 mg/dL (ref 0.6–1.1)
EGFR: 85 mL/min/{1.73_m2} — ABNORMAL LOW (ref >90)
Glucose: 80 mg/dl (ref 70–140)
Potassium: 3.9 mEq/L (ref 3.5–5.1)
Sodium: 138 mEq/L (ref 136–145)
Total Bilirubin: 0.53 mg/dL (ref 0.20–1.20)
Total Protein: 6.9 g/dL (ref 6.4–8.3)

## 2015-02-15 MED ORDER — IOHEXOL 300 MG/ML  SOLN
50.0000 mL | Freq: Once | INTRAMUSCULAR | Status: AC | PRN
  Administered 2015-02-15: 50 mL via ORAL

## 2015-02-15 MED ORDER — IOHEXOL 300 MG/ML  SOLN
100.0000 mL | Freq: Once | INTRAMUSCULAR | Status: AC | PRN
  Administered 2015-02-15: 100 mL via INTRAVENOUS

## 2015-02-16 ENCOUNTER — Other Ambulatory Visit: Payer: Self-pay

## 2015-03-02 ENCOUNTER — Ambulatory Visit (HOSPITAL_BASED_OUTPATIENT_CLINIC_OR_DEPARTMENT_OTHER): Payer: Medicare Other | Admitting: Internal Medicine

## 2015-03-02 ENCOUNTER — Encounter: Payer: Self-pay | Admitting: Internal Medicine

## 2015-03-02 ENCOUNTER — Telehealth: Payer: Self-pay | Admitting: Internal Medicine

## 2015-03-02 VITALS — BP 128/79 | HR 81 | Temp 98.0°F | Resp 18 | Ht 68.0 in | Wt 193.9 lb

## 2015-03-02 DIAGNOSIS — C3491 Malignant neoplasm of unspecified part of right bronchus or lung: Secondary | ICD-10-CM

## 2015-03-02 DIAGNOSIS — C3432 Malignant neoplasm of lower lobe, left bronchus or lung: Secondary | ICD-10-CM | POA: Diagnosis not present

## 2015-03-02 DIAGNOSIS — C7931 Secondary malignant neoplasm of brain: Secondary | ICD-10-CM

## 2015-03-02 NOTE — Telephone Encounter (Signed)
Gave adn printed appt sched and avs for pt for Aug

## 2015-03-02 NOTE — Progress Notes (Signed)
[place] [phone]   [phone]  OFFICE PROGRESS NOTE  [name], MD [address] [address]  DIAGNOSIS: Metastatic non-small cell lung cancer with brain metastasis, diagnosed initially a stage IIB adenocarcinoma in October 2002.  BIOMARKERS TESTING ([facility]): Positive for: EGFR [id number] del, T790M, CTNNB1 D32G, SMAD4 T153fs*10 Negative for: RET, ALK, BRAF, KRAS, ERBB2 and MET  PRIOR THERAPY:  1. Status post left lower lobectomy under the care of Dr Arlyce Dice in October 2002. 2. Status post course of concurrent chemoradiation with weekly carboplatin and paclitaxel under the care of Dr Benay Spice and Dr Elba Barman in early 2003. 3. Status post treatment with Celebrex and Iressa according to the clinical trial at [facility] for a total of 1 year. 4. Status post left occipital craniotomy for tumor resection on September 21, 2005, under the care of Dr Christella Noa. 5. Status post whole brain irradiation under the care of Dr Elba Barman, completed November 07, 2005. 6. Status post gamma knife treatment to the resected cavitary recurrence on June 02, 2008, at [facility]. 7. Status post right thyroidectomy with radical neck dissection under the care of Dr. Constance Holster on 10/18/2011. 8. Status post excisional biopsy of right cervical lymph node that was consistent with metastatic adenocarcinoma. 9. Tarceva 150 mg by mouth daily with therapy beginning 06/03/2007, discontinued in July 2015 secondary to disease progression. Status post 79 cycles. 10. palliative radiotherapy to the enlarging right upper lobe lung nodule under the care of Dr. Valere Dross. 11. Treatment at [facility] [address] on STUDY [id number]  300 mg by mouth daily for 21 days discontinued secondary to study deviation and inability for the patient to keep her appointment at [facility] secondary to recent hip fractures. 12. Open reduction and internal  fixation of trimalleolar ankle fracture dislocation with fixation of the fibula as well as medial          malleolus.   CURRENT THERAPY:  1) Tagrisso 80 mg by mouth daily, started 11/25/2014, status post 3 months of treatment.  CHEMOTHERAPY INTENT: palliative  CURRENT # OF CHEMOTHERAPY CYCLES: 4  CURRENT ANTIEMETICS: Compazine when necessary  CURRENT SMOKING STATUS: nonsmoker  ORAL CHEMOTHERAPY AND CONSENT: Tarceva  CURRENT BISPHOSPHONATES USE: None  PAIN MANAGEMENT: None  NARCOTICS INDUCED CONSTIPATION: None  LIVING WILL AND CODE STATUS: Full code  INTERVAL HISTORY: Valerie [name] 69 y.o. female returns to the clinic today for follow up visit. The patient is currently on treatment with Tagrisso 80 mg by mouth daily for the last 3 months and she is tolerating her treatment fairly well with no significant adverse effects. She denied having any significant skin rash or diarrhea. She is feeling fine with no specific complaints. She denied having any significant chest pain, shortness of breath, cough or hemoptysis. She denied having any significant nausea or vomiting. She had repeat CT scan of the chest, abdomen and pelvis performed recently and she is here for evaluation and discussion of her scan results.  MEDICAL HISTORY: Past Medical History  Diagnosis Date  . Heart murmur   . Shortness of breath     hx lung ca   . Arthritis   . Anal fistula   . H/O osteoporosis   . H/O vitamin D deficiency   . History of measles, mumps, or rubella   . H/O varicella   . Hypertension   . Yeast infection   . Abnormal Pap smear 2006  .  Atrophic vaginitis 2008  . Dyspareunia 2008  . Dementia 2009  . Osteoporosis 2010  . Status post chemotherapy 2003    CARBOPLATIN/PACLITAXEL /STATUS POST CLINICAL TRAIL OF CELEBREX AND IRESSA AT [facility] FOR 1 YEAR  . Status post radiation therapy 2003    LEFT LUNG  . Status post radiation therapy 11/07/2005    WHOLE BRAIN: DR Larkin [facility]  . Status  post radiation therapy 06/02/2008    GAMMA KNIFE OF RESECTED CAVITAY  . On antineoplastic chemotherapy     TARCEVA  . Nodule of right lung CT- 06/03/12    RIGHT UPPER LOBE  . Thyroid adenoma     ?  [name] Anxiety   . Cataract   . Nodule of right lung 06/03/12    Upper Lobe  . History of radiation therapy 07/28/13- 08/10/13    right lung metastasis 5000 cGy 10 sessions  . Ankle fracture   . Lung cancer dx'd 2002  . Metastasis to brain dx'd 2008  . 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 Thyroid cancer 10/18/11 bx    adenoid nodules   . Lung metastasis     PET scan 05/05/13, RUL lung nodule  . Lung cancer   . Lung cancer     ALLERGIES:  has No Known Allergies.  MEDICATIONS:  Current Outpatient Prescriptions  Medication Sig Dispense Refill  . alendronate (FOSAMAX) 70 MG tablet Take 1 tablet (70 mg total) by mouth every 7 (seven) days. Saturdays; Take with a full glass of water on an empty stomach. 4 tablet 12  . cholecalciferol (VITAMIN D) 1000 UNITS tablet Take 1,000 Units by mouth at bedtime.    . donepezil (ARICEPT) 10 MG tablet Take 10 mg by mouth daily.  0  . Multiple Vitamin (MULTI-VITAMINS) TABS Take by mouth.    . naphazoline-pheniramine (NAPHCON-A) 0.025-0.3 % ophthalmic solution Administer 1 drop to both eyes continuous as needed.    [name] osimertinib mesylate (TAGRISSO) 80 MG tablet Take 1 tablet (80 mg total) by mouth daily. 30 tablet 2  . VESICARE 5 MG tablet Take 5 mg by mouth daily.    [name] 20 MG TABS tablet Take 1 tablet (20 mg total) by mouth daily. 30 tablet 0  . loperamide (IMODIUM) 2 MG capsule Take 2 mg by mouth.    . research study medication Take home supply.     No current facility-administered medications for this visit.    SURGICAL HISTORY:  Past Surgical History  Procedure Laterality Date  . Left occipital craniotomy  09/21/2005    tumor  . Left lower lobectomy  05/2001  . Radical neck dissection  10/18/2011    Procedure: RADICAL NECK DISSECTION;   Surgeon: Izora Gala, MD;  Location: [place];  Service: ENT;  Laterality: Right;  RIGHT MODIFIED NECK DISSECTION /POSSIBLE RIGHT THYROIDECTOM  . Thyroidectomy  10/18/2011    Procedure: THYROIDECTOMY WITH RADICAL NECK DISSECTION;  Surgeon: Izora Gala, MD;  Location: [place];  Service: ENT;  Laterality: Right;  . Lung lobectomy    . Neck surgery    . Brain surgery    . Orif ankle fracture Left 10/02/2014    Procedure: OPEN REDUCTION INTERNAL FIXATION (ORIF) ANKLE FRACTURE;  Surgeon: Alta Corning, MD;  Location: [facility];  Service: Orthopedics;  Laterality: Left;    REVIEW OF SYSTEMS:  Constitutional: positive for fatigue Eyes: negative Ears, nose, mouth, throat, and face: negative Respiratory: negative Cardiovascular: negative Gastrointestinal: negative Genitourinary:negative Integument/breast: negative Hematologic/lymphatic: negative Musculoskeletal:negative Neurological: negative  Behavioral/Psych: negative Endocrine: negative Allergic/Immunologic: negative   PHYSICAL EXAMINATION: General appearance: alert, cooperative, fatigued and no distress Head: Normocephalic, without obvious abnormality, atraumatic Neck: no adenopathy Lymph nodes: Cervical, supraclavicular, and axillary nodes normal. Resp: clear to auscultation bilaterally Cardio: regular rate and rhythm, S1, S2 normal, no murmur, click, rub or gallop GI: soft, non-tender; bowel sounds normal; no masses,  no organomegaly Extremities: extremities normal, atraumatic, no cyanosis or edema Neurologic: Alert and oriented X 3, normal strength and tone. Normal symmetric reflexes. Normal coordination and gait  ECOG PERFORMANCE STATUS: 1 - Symptomatic but completely ambulatory  Blood pressure 128/79, pulse 81, temperature 98 F (36.7 C), temperature source Oral, resp. rate 18, height $RemoveBe'5\' 8"'gVPmfCHGz$  (1.727 m), weight 193 lb 14.4 oz (87.952 kg), SpO2 99 %.  LABORATORY DATA: Lab Results  Component Value Date   WBC 5.5 02/15/2015   HGB 12.4  02/15/2015   HCT 40.1 02/15/2015   MCV 83.8 02/15/2015   PLT 232 02/15/2015      Chemistry      Component Value Date/Time   NA 138 02/15/2015 1223   NA 135 10/22/2014 0527   NA 138 02/28/2012 0939   K 3.9 02/15/2015 1223   K 3.5 10/22/2014 0527   K 4.6 02/28/2012 0939   CL 102 10/22/2014 0527   CL 106 12/09/2012 0806   CL 99 02/28/2012 0939   CO2 27 02/15/2015 1223   CO2 27 10/22/2014 0527   CO2 28 02/28/2012 0939   BUN 13.1 02/15/2015 1223   BUN 17 10/22/2014 0527   BUN 12 02/28/2012 0939   CREATININE 0.8 02/15/2015 1223   CREATININE 0.64 10/22/2014 0527   CREATININE 1.0 02/28/2012 0939      Component Value Date/Time   CALCIUM 9.3 02/15/2015 1223   CALCIUM 8.1* 10/22/2014 0527   CALCIUM 9.1 02/28/2012 0939   ALKPHOS 66 02/15/2015 1223   ALKPHOS 72 10/21/2014 0610   ALKPHOS 80 02/28/2012 0939   AST 14 02/15/2015 1223   AST 22 10/21/2014 0610   AST 20 02/28/2012 0939   ALT 10 02/15/2015 1223   ALT 24 10/21/2014 0610   ALT 21 02/28/2012 0939   BILITOT 0.53 02/15/2015 1223   BILITOT 0.5 10/21/2014 0610   BILITOT 1.30 02/28/2012 0939       RADIOGRAPHIC STUDIES: Ct Chest W Contrast  02/15/2015   CLINICAL DATA:  Metastatic non-small-cell lung cancer diagnosed most recently in 2014. Radiation therapy 2/15. Thyroid cancer in 2013. No current complaints. Right lung primary.  EXAM: CT CHEST, ABDOMEN, AND PELVIS WITH CONTRAST  TECHNIQUE: Multidetector CT imaging of the chest, abdomen and pelvis was performed following the standard protocol during bolus administration of intravenous contrast.  CONTRAST:  39mL OMNIPAQUE IOHEXOL 300 MG/ML SOLN, 149mL OMNIPAQUE IOHEXOL 300 MG/ML SOLN  COMPARISON:  Chest CT 11/15/2014.  Abdominal pelvic CT 10/20/2014.  FINDINGS: CT CHEST FINDINGS  Mediastinum/Nodes: No supraclavicular adenopathy. No axillary adenopathy. Left subpectoral nodes measure up to 5 mm on image 16 and are similar. Not pathologic by size criteria. Tortuous descending  thoracic aorta. Mild cardiomegaly with trace pericardial fluid or thickening, likely physiologic. No central pulmonary embolism, on this non-dedicated study. No mediastinal or hilar adenopathy.  Esophageal fluid level again identified.  Lungs/Pleura: Similar left-sided pleural fluid with minimal loculation superiorly. Surgical changes of left lower lobectomy.  Right upper lobe peribronchovascular presumed radiation change is similar with no evidence of locally recurrent disease. There is also stone left upper lobe radiation fibrosis.  4 mm right upper lobe  pulmonary nodule on image 22 is unchanged.  Subpleural right lower lobe pulmonary nodule on image 27 is similar.  3 mm left apical pulmonary nodule is similar on image 10.  Vague left anterior 5 mm lung nodule is similar.  Musculoskeletal: No acute osseous abnormality.  CT ABDOMEN AND PELVIS FINDINGS  Hepatobiliary: Too small to characterize lateral segment left liver lobe low-density lesion is unchanged. No suspicious liver lesion. Normal gallbladder, without biliary ductal dilatation.  Pancreas: Normal, without mass or ductal dilatation.  Spleen: Normal  Adrenals/Urinary Tract: Normal adrenal glands. Partially duplicated left renal collecting system. Improvement in now minimal left-sided lower pole caliectasis. No obstructive cause identified. Resolved bladder distension.  Stomach/Bowel: Normal stomach, without wall thickening. Normal colon, appendix, and terminal ileum. Normal small bowel.  Vascular/Lymphatic: Normal caliber of the aorta. 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 No abdominopelvic adenopathy.  Reproductive: Suspect a uterine fundal fibroid, with vague soft tissue fullness in hypoattenuation on image 111. This has been present back to 01/20/2014. No adnexal mass.  Other: No significant free fluid. No evidence of omental or peritoneal disease.  Musculoskeletal: Superior endplate compression deformity at  L4 is eccentric right and not significantly changed.  IMPRESSION: CT CHEST IMPRESSION  1. Surgical changes of left lower lobectomy. Radiation changes in the right upper lobe. No locally recurrent or progressive disease. 2. Similar small left pleural effusion with mild loculation. 3. Similar scattered pulmonary nodules. 4. Esophageal air fluid level suggests dysmotility or gastroesophageal reflux.  CT ABDOMEN AND PELVIS IMPRESSION  1. No acute process or evidence of metastatic disease in the abdomen or pelvis. 2. 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 3. Uterine soft tissue fullness which is favored to be related to an underlying fibroid. If there are symptoms of postmenopausal bleeding, consider ultrasound. 4. 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   Electronically Signed   By: Abigail Miyamoto M.D.   On: 02/15/2015 16:16   Ct Abdomen Pelvis W Contrast  02/15/2015   CLINICAL DATA:  Metastatic non-small-cell lung cancer diagnosed most recently in 2014. Radiation therapy 2/15. Thyroid cancer in 2013. No current complaints. Right lung primary.  EXAM: CT CHEST, ABDOMEN, AND PELVIS WITH CONTRAST  TECHNIQUE: Multidetector CT imaging of the chest, abdomen and pelvis was performed following the standard protocol during bolus administration of intravenous contrast.  CONTRAST:  50mL OMNIPAQUE IOHEXOL 300 MG/ML SOLN, 121mL OMNIPAQUE IOHEXOL 300 MG/ML SOLN  COMPARISON:  Chest CT 11/15/2014.  Abdominal pelvic CT 10/20/2014.  FINDINGS: CT CHEST FINDINGS  Mediastinum/Nodes: No supraclavicular adenopathy. No axillary adenopathy. Left subpectoral nodes measure up to 5 mm on image 16 and are similar. Not pathologic by size criteria. Tortuous descending thoracic aorta. Mild cardiomegaly with trace pericardial fluid or thickening, likely physiologic. No central pulmonary embolism, on this non-dedicated study. No mediastinal  or hilar adenopathy.  Esophageal fluid level again identified.  Lungs/Pleura: Similar left-sided pleural fluid with minimal loculation superiorly. Surgical changes of left lower lobectomy.  Right upper lobe peribronchovascular presumed radiation change is similar with no evidence of locally recurrent disease. There is also stone left upper lobe radiation fibrosis.  4 mm right upper lobe pulmonary nodule on image 22 is unchanged.  Subpleural right lower lobe pulmonary nodule on image 27 is similar.  3 mm left apical pulmonary nodule is similar on image 10.  Vague left anterior 5 mm lung nodule is  similar.  Musculoskeletal: No acute osseous abnormality.  CT ABDOMEN AND PELVIS FINDINGS  Hepatobiliary: Too small to characterize lateral segment left liver lobe low-density lesion is unchanged. No suspicious liver lesion. Normal gallbladder, without biliary ductal dilatation.  Pancreas: Normal, without mass or ductal dilatation.  Spleen: Normal  Adrenals/Urinary Tract: Normal adrenal glands. Partially duplicated left renal collecting system. Improvement in now minimal left-sided lower pole caliectasis. No obstructive cause identified. Resolved bladder distension.  Stomach/Bowel: Normal stomach, without wall thickening. Normal colon, appendix, and terminal ileum. Normal small bowel.  Vascular/Lymphatic: Normal caliber of the aorta. 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 No abdominopelvic adenopathy.  Reproductive: Suspect a uterine fundal fibroid, with vague soft tissue fullness in hypoattenuation on image 111. This has been present back to 01/20/2014. No adnexal mass.  Other: No significant free fluid. No evidence of omental or peritoneal disease.  Musculoskeletal: Superior endplate compression deformity at L4 is eccentric right and not significantly changed.  IMPRESSION: CT CHEST IMPRESSION  1. Surgical changes of left lower lobectomy. Radiation changes in the right upper  lobe. No locally recurrent or progressive disease. 2. Similar small left pleural effusion with mild loculation. 3. Similar scattered pulmonary nodules. 4. Esophageal air fluid level suggests dysmotility or gastroesophageal reflux.  CT ABDOMEN AND PELVIS IMPRESSION  1. No acute process or evidence of metastatic disease in the abdomen or pelvis. 2. 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 3. Uterine soft tissue fullness which is favored to be related to an underlying fibroid. If there are symptoms of postmenopausal bleeding, consider ultrasound. 4. 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   Electronically Signed   By: Abigail Miyamoto M.D.   On: 02/15/2015 16:16   ASSESSMENT AND PLAN: this is a very pleasant 69 years old [place] American female with metastatic non-small cell lung cancer adenocarcinoma, with positive EGFR mutation but also resistant mutation T790M. She has been on treatment with oral Tarceva for more than 6 years.  The patient has EGFR resistant mutation T790M and was treated for the last 8 months with [id number] on a clinical trial at [facility] but this was discontinued secondary to inability to follow-up with the protocol requirement. She had no evidence for disease progression on that treatment. The patient is currently on Tagrisso 80 mg by mouth daily status post 3 months and tolerating it fairly well with no significant adverse effects.  The recent CT scan of the chest, abdomen and pelvis showed no evidence for disease progression. I discussed the scan results with the patient today. I recommended for her to continue treatment with Tagrisso 80 mg by mouth daily. I will see her back for follow-up visit in one month for reevaluation after repeating CBC and comprehensive metabolic panel. She was advised to call immediately if she has any concerning symptoms in the interval.  The  patient voices understanding of current disease status and treatment options and is in agreement with the current care plan.  All questions were answered. The patient knows to call the clinic with any problems, questions or concerns. We can certainly see the patient much sooner if necessary.  Disclaimer: This note was dictated with voice recognition software. Similar sounding words can inadvertently be transcribed and may not be corrected upon review.

## 2015-03-10 DIAGNOSIS — M25572 Pain in left ankle and joints of left foot: Secondary | ICD-10-CM | POA: Diagnosis not present

## 2015-03-28 IMAGING — CT CT CHEST W/ CM
4 of 7 series · 17 of 36 positions shown, 18 images · IV contrast (OMNIPAQUE)
Comparison: 12/09/2012

CT NECK

CLINICAL DATA: Lung cancer diagnosed 7777 and 7886, thyroid cancer
diagnosed 4987, status post partial thyroidectomy, tarceva in
progress

CT NECK AND CHEST WITH CONTRAST
TECHNIQUE: Multidetector CT imaging of the neck and chest was
performed using the standard protocol after bolus administration of
intravenous contrast.
Contrast: 100mL OMNIPAQUE IOHEXOL 300 MG/ML  SOLN

[Series 2: chest st · axial · 0.73mm/px · z∈[-393,-213]mm · 4 of 61 slices shown, 5 images]
[im 13/61  mediastinal]
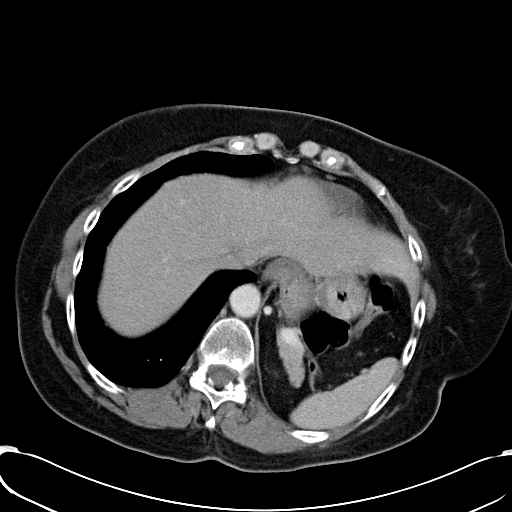
[im 13/61  lung]
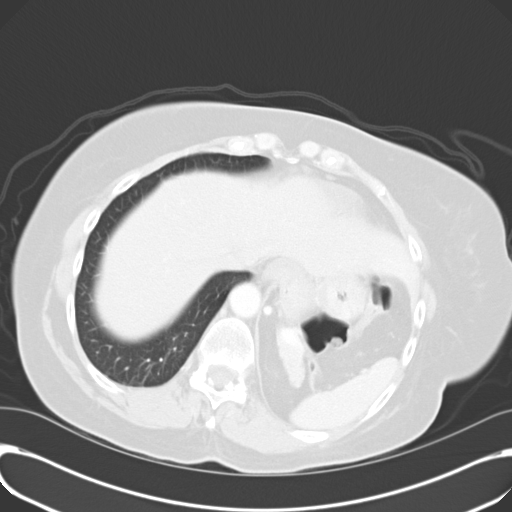
[im 25/61  lung]
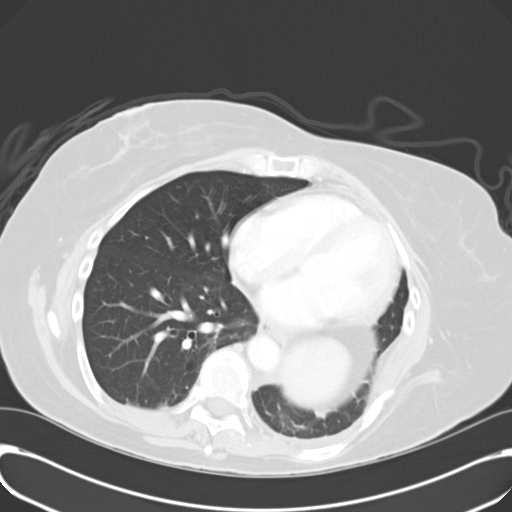
[im 37/61  lung]
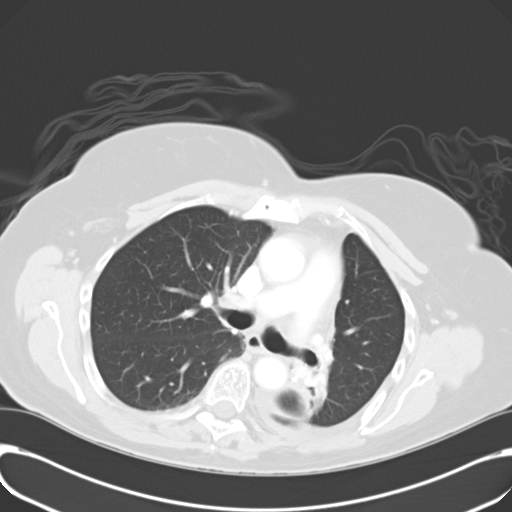
[im 49/61  lung]
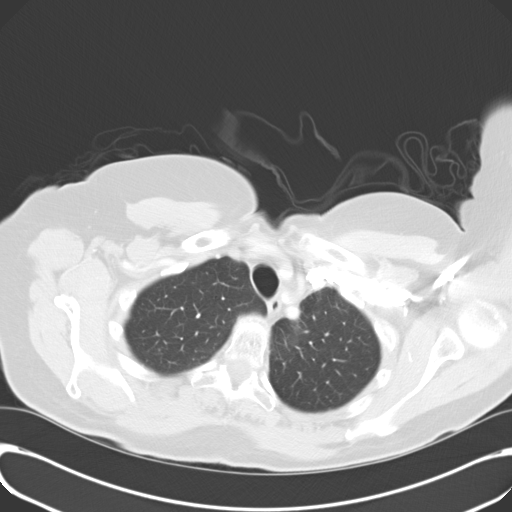

[Series 4: neck st · axial · 0.46mm/px · z∈[-212,-32]mm · 8 of 114 slices shown]
[im 12/114  lung]
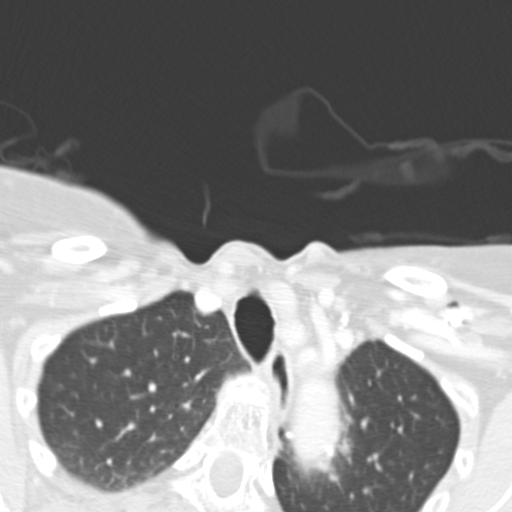
[im 23/114  lung]
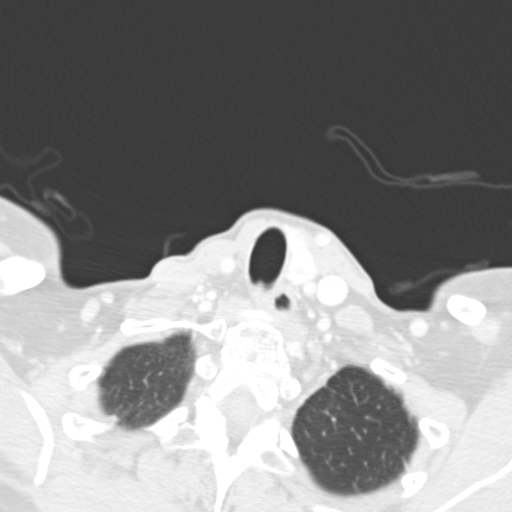
[im 34/114  lung]
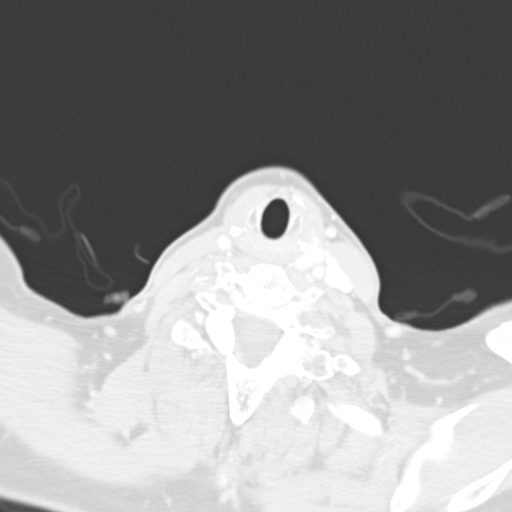
[im 46/114  lung]
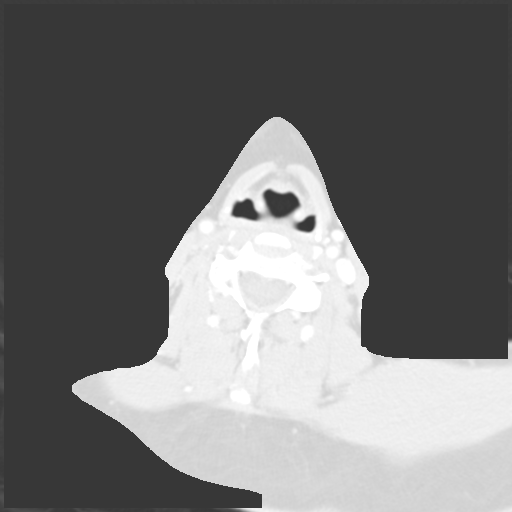
[im 68/114  lung]
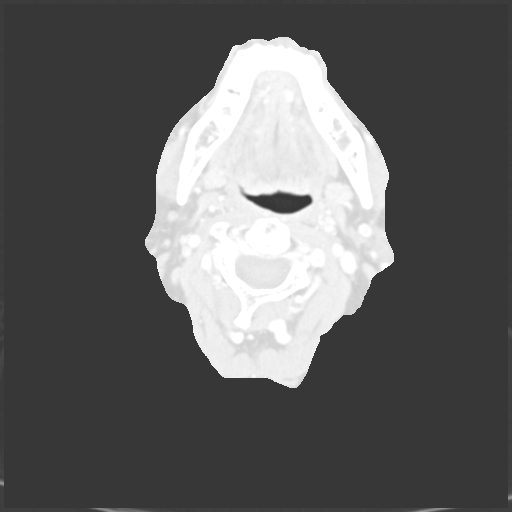
[im 80/114  lung]
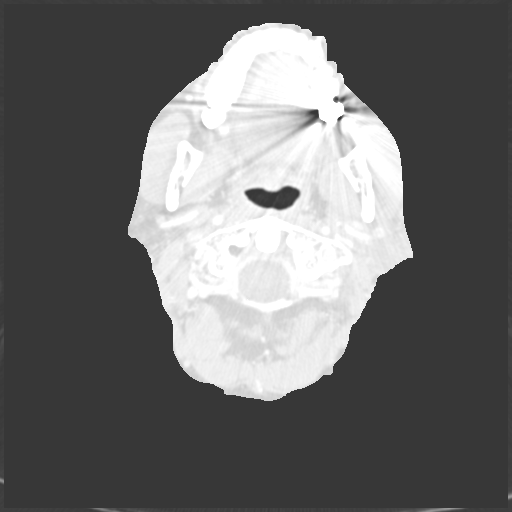
[im 91/114  lung]
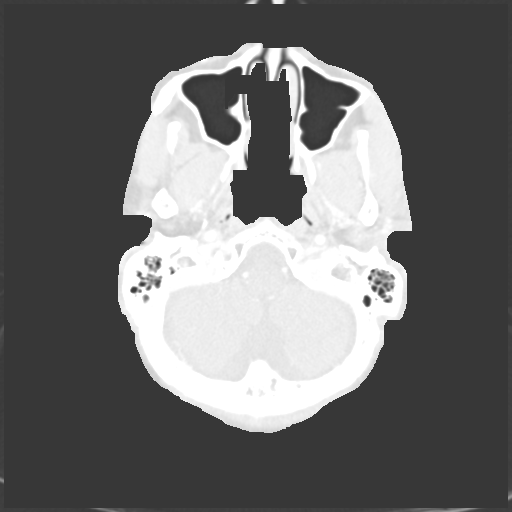
[im 102/114  lung]
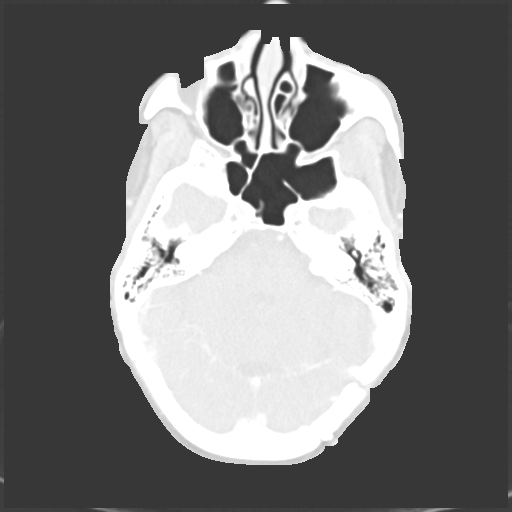

[Series 602: <mpr thick range> · coronal · 0.46mm/px · 1 of 110 slices shown]
[im 55/110  lung]
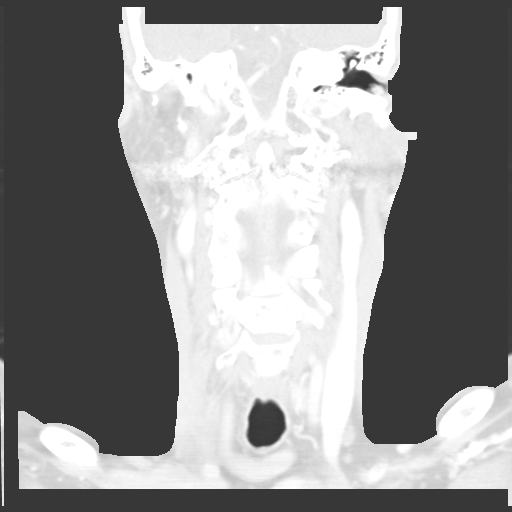

[Series 603: <mpr thick range(1)> · axial · 0.46mm/px · z∈[-222,-152]mm · 4 of 116 slices shown]
[im 12/116  lung]
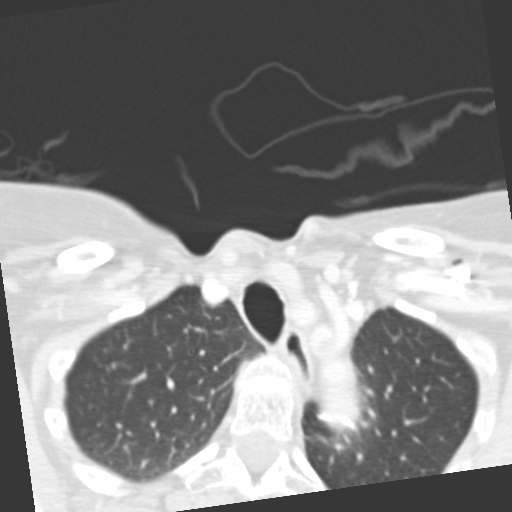
[im 24/116  lung]
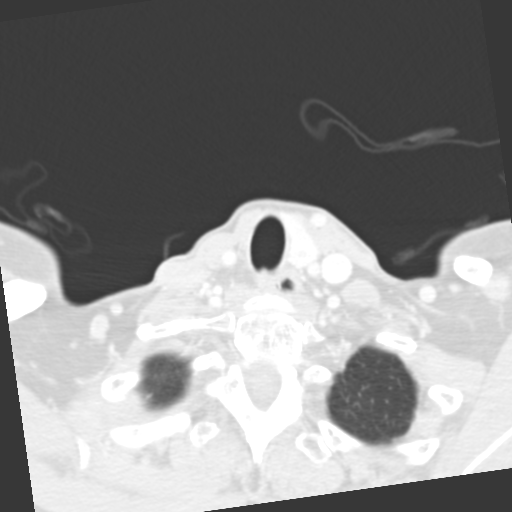
[im 35/116  lung]
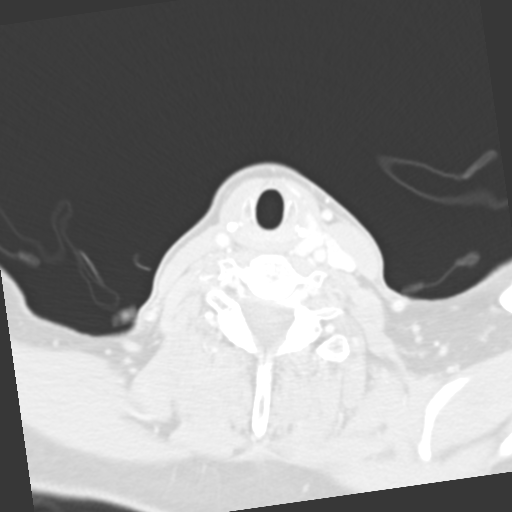
[im 47/116  lung]
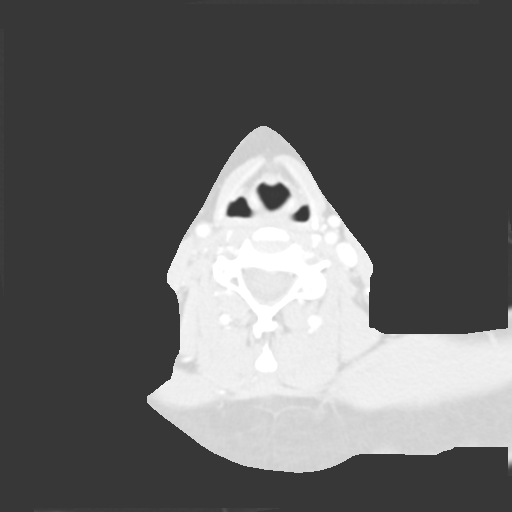

[17 of 36 positions shown; findings below may reference images not displayed]

FINDINGS: Status post right thyroidectomy.

Small right level Ib lymph nodes measuring up to 4 mm short axis
(series 4/image 57), within normal limits.  No suspicious cervical
lymphadenopathy.

Fatty atrophy of the right parotid gland.

Pharyngeal soft tissues are unremarkable.  The airway remains
patent.

The visualized paranasal sinuses are essentially clear. The mastoid
air cells are unopacified.

Visualized brain parenchyma is unremarkable.

Cervical spine is within normal limits.
IMPRESSION: Status post right thyroidectomy.

No evidence of recurrent or metastatic disease in the neck.

CT CHEST
FINDINGS: Status post left lower lobectomy with associated volume
loss in the left hemithorax.  Suspected wedge resection in the
medial left lung apex (series 7/image 15).  Associated
paramediastinal radiation changes.

Multiple bilateral pulmonary nodules, including:
--3 mm nodule the left lung apex (series 7/image 11), unchanged
--4 mm nodule in the anterior left upper lobe (series 7/image 20),
unchanged
--12 x 11 mm right upper lobe nodule (series 7/image 21),
previously 12 x 10 mm
--4 mm nodule in the lateral right upper lobe (series 7/image 22),
unchanged
--9 mm nodule in the central left lower lung (series 7/image 33),
previously 8 mm
--6 mm nodule in the left lower lung (series 7/image 34), unchanged

Trace left pleural effusion.  No pneumothorax.

The heart is normal in size.  Trace pericardial fluid.

No suspicious mediastinal, hilar, or axillary lymphadenopathy.

Visualized upper abdomen is unremarkable.

Degenerative changes of the thoracic spine.
IMPRESSION: Status post left lower lobectomy with radiation changes in the left
hemithorax.

Multiple bilateral pulmonary nodules, as described above,
suspicious for metastases but grossly unchanged.

Dominant right upper lobe nodule measures 12 x 11 mm, previously 12
x 10 mm.

## 2015-03-30 ENCOUNTER — Telehealth: Payer: Self-pay | Admitting: Internal Medicine

## 2015-03-30 ENCOUNTER — Other Ambulatory Visit (HOSPITAL_BASED_OUTPATIENT_CLINIC_OR_DEPARTMENT_OTHER): Payer: Medicare Other

## 2015-03-30 ENCOUNTER — Ambulatory Visit (HOSPITAL_BASED_OUTPATIENT_CLINIC_OR_DEPARTMENT_OTHER): Payer: Medicare Other | Admitting: Internal Medicine

## 2015-03-30 ENCOUNTER — Encounter: Payer: Self-pay | Admitting: Internal Medicine

## 2015-03-30 VITALS — BP 121/67 | HR 90 | Temp 97.9°F | Resp 17 | Ht 68.0 in | Wt 189.0 lb

## 2015-03-30 DIAGNOSIS — C3491 Malignant neoplasm of unspecified part of right bronchus or lung: Secondary | ICD-10-CM

## 2015-03-30 DIAGNOSIS — C3432 Malignant neoplasm of lower lobe, left bronchus or lung: Secondary | ICD-10-CM

## 2015-03-30 DIAGNOSIS — C7931 Secondary malignant neoplasm of brain: Secondary | ICD-10-CM

## 2015-03-30 LAB — COMPREHENSIVE METABOLIC PANEL (CC13)
ALT: 16 U/L (ref 0–55)
AST: 14 U/L (ref 5–34)
Albumin: 3.9 g/dL (ref 3.5–5.0)
Alkaline Phosphatase: 65 U/L (ref 40–150)
Anion Gap: 7 mEq/L (ref 3–11)
BUN: 13.1 mg/dL (ref 7.0–26.0)
CO2: 22 mEq/L (ref 22–29)
Calcium: 9.6 mg/dL (ref 8.4–10.4)
Chloride: 109 mEq/L (ref 98–109)
Creatinine: 0.8 mg/dL (ref 0.6–1.1)
EGFR: 83 mL/min/{1.73_m2} — ABNORMAL LOW (ref >90)
Glucose: 118 mg/dl (ref 70–140)
Potassium: 3.8 mEq/L (ref 3.5–5.1)
Sodium: 138 mEq/L (ref 136–145)
Total Bilirubin: 0.43 mg/dL (ref 0.20–1.20)
Total Protein: 6.7 g/dL (ref 6.4–8.3)

## 2015-03-30 LAB — CBC WITH DIFFERENTIAL/PLATELET
BASO%: 0.4 % (ref 0.0–2.0)
Basophils Absolute: 0 10*3/uL (ref 0.0–0.1)
EOS%: 2.3 % (ref 0.0–7.0)
Eosinophils Absolute: 0.1 10*3/uL (ref 0.0–0.5)
HCT: 39.8 % (ref 34.8–46.6)
HGB: 12.7 g/dL (ref 11.6–15.9)
LYMPH%: 14.8 % (ref 14.0–49.7)
MCH: 27.2 pg (ref 25.1–34.0)
MCHC: 31.9 g/dL (ref 31.5–36.0)
MCV: 85.4 fL (ref 79.5–101.0)
MONO#: 0.4 10*3/uL (ref 0.1–0.9)
MONO%: 8.6 % (ref 0.0–14.0)
NEUT#: 3.3 10*3/uL (ref 1.5–6.5)
NEUT%: 73.9 % (ref 38.4–76.8)
Platelets: 221 10*3/uL (ref 145–400)
RBC: 4.66 10*6/uL (ref 3.70–5.45)
RDW: 16.8 % — ABNORMAL HIGH (ref 11.2–14.5)
WBC: 4.5 10*3/uL (ref 3.9–10.3)
lymph#: 0.7 10*3/uL — ABNORMAL LOW (ref 0.9–3.3)

## 2015-03-30 NOTE — Telephone Encounter (Signed)
per pof to sch pt-gave pt copy of avs

## 2015-03-30 NOTE — Progress Notes (Signed)
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  BIOMARKERS TESTING ([facility]): Positive for: EGFR [id number] del, T790M, CTNNB1 D32G, SMAD4 T169f*10 Negative for: RET, ALK, BRAF, KRAS, ERBB2 and MET  PRIOR THERAPY:  1. Status post left lower lobectomy under the care of Dr BArlyce Dicein October 2002. 2. Status post course of concurrent chemoradiation with weekly carboplatin and paclitaxel under the care of Dr SBenay Spiceand Dr WElba Barmanin early 2003. 3. Status post [facility] 4. Status post left occipital craniotomy for tumor resection on September 21, 2005, under the care of Dr CChristella Noa 5. Status post whole brain irradiation under the care of Dr WElba Barman completed November 07, 2005. 6. Status post gamma knife [facility]. Status post right thyroidectomy with radical neck dissection under the care of Dr. RConstance Holsteron 10/18/2011. 8. Status post excisional biopsy of right cervical lymph node that was consistent with metastatic adenocarcinoma. 9. Tarceva 150 mg by mouth daily with therapy beginning 06/03/2007, discontinued in July 2015 secondary to disease progression. Status post 79 cycles. 10. palliative radiotherapy to the enlarging right upper lobe lung nodule under the care of Dr. MValere Dross [facility]. [id number] 300 mg by mouth daily for 21 days discontinued secondary to study deviation [facility]. 12. Open reduction and internal  fixation of trimalleolar ankle fracture dislocation with fixation of the fibula as well as medial          malleolus.   CURRENT THERAPY:  1) Tagrisso 80 mg by mouth daily, started 11/25/2014, status post 4 months of treatment.  INTERVAL HISTORY: JCALLAWAY HARDIGREE69y.o. female returns to the clinic today for follow up visit. The patient is currently on treatment with Tagrisso 80 mg by mouth daily for the last 4 months and she is tolerating her treatment fairly well with no significant adverse effects. She denied having any significant skin rash but has intermittent diarrhea. She is feeling fine with no specific complaints. She denied having any significant chest pain, shortness of breath, cough or hemoptysis. She denied having any significant nausea or vomiting. She had repeat CBC and comprehensive metabolic panel performed earlier today and she is here for evaluation and discussion of her lab results.  MEDICAL HISTORY: Past Medical History  Diagnosis Date  . Heart murmur   . Shortness of breath     hx lung ca   . Arthritis   . Anal fistula   . H/O osteoporosis   . H/O vitamin D deficiency   . History of measles, mumps, or rubella   . H/O varicella   . Hypertension   . Yeast infection   . Abnormal Pap smear 2006  . Atrophic vaginitis 2008  . Dyspareunia 2008  . Dementia 2009  . Osteoporosis 2010  . Status post chemotherapy 2003    CARBOPLATIN/PACLITAXEL /STATUS POST CLINICAL TRAIL OF CELEBREX AND IRESSA AT [facility] FOR 1 YEAR  . Status post radiation therapy 2003    LEFT LUNG  .  Status post radiation therapy 11/07/2005    WHOLE BRAIN: DR [facility]  . Status post radiation therapy 06/02/2008    GAMMA KNIFE OF RESECTED CAVITAY  . On antineoplastic chemotherapy     TARCEVA  . Nodule of right lung CT- 06/03/12    RIGHT UPPER LOBE  . Thyroid adenoma     ?  . Anxiety   . Cataract   . Nodule of right lung 06/03/12    Upper Lobe  . History of radiation therapy 07/28/13-  08/10/13    right lung metastasis 5000 cGy 10 sessions  . Ankle fracture   . Lung cancer dx'd 2002  . Metastasis to brain dx'd 2008  . 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 Thyroid cancer 10/18/11 bx    adenoid nodules   . Lung metastasis     PET scan 05/05/13, RUL lung nodule  . Lung cancer   . Lung cancer     ALLERGIES:  has No Known Allergies.  MEDICATIONS:  Current Outpatient Prescriptions  Medication Sig Dispense Refill  . alendronate (FOSAMAX) 70 MG tablet Take 1 tablet (70 mg total) by mouth every 7 (seven) days. Saturdays; Take with a full glass of water on an empty stomach. 4 tablet 12  . cholecalciferol (VITAMIN D) 1000 UNITS tablet Take 1,000 Units by mouth at bedtime.    . donepezil (ARICEPT) 10 MG tablet Take 10 mg by mouth daily.  0  . loperamide (IMODIUM) 2 MG capsule Take 2 mg by mouth.    . Multiple Vitamin (MULTI-VITAMINS) TABS Take by mouth.    . osimertinib mesylate (TAGRISSO) 80 MG tablet Take 1 tablet (80 mg total) by mouth daily. 30 tablet 2  . research study medication Take home supply.    . XARELTO 20 MG TABS tablet Take 1 tablet (20 mg total) by mouth daily. 30 tablet 0   No current facility-administered medications for this visit.    SURGICAL HISTORY:  Past Surgical History  Procedure Laterality Date  . Left occipital craniotomy  09/21/2005    tumor  . Left lower lobectomy  05/2001  . Radical neck dissection  10/18/2011    Procedure: RADICAL NECK DISSECTION;  Surgeon: Jefry Rosen, MD;  Location: [facility] OR;  Service: ENT;  Laterality: Right;  RIGHT MODIFIED NECK DISSECTION /POSSIBLE RIGHT THYROIDECTOM  . Thyroidectomy  10/18/2011    Procedure: THYROIDECTOMY WITH RADICAL NECK DISSECTION;  Surgeon: Jefry Rosen, MD;  Location: [facility] OR;  Service: ENT;  Laterality: Right;  . Lung lobectomy    . Neck surgery    . Brain surgery    . Orif ankle fracture Left 10/02/2014    Procedure: OPEN REDUCTION INTERNAL FIXATION (ORIF) ANKLE FRACTURE;  Surgeon: John L Graves,  MD;  Location: [facility];  Service: Orthopedics;  Laterality: Left;    REVIEW OF SYSTEMS:  A comprehensive review of systems was negative except for: Constitutional: positive for fatigue Gastrointestinal: positive for diarrhea   PHYSICAL EXAMINATION: General appearance: alert, cooperative, fatigued and no distress Head: Normocephalic, without obvious abnormality, atraumatic Neck: no adenopathy Lymph nodes: Cervical, supraclavicular, and axillary nodes normal. Resp: clear to auscultation bilaterally Cardio: regular rate and rhythm, S1, S2 normal, no murmur, click, rub or gallop GI: soft, non-tender; bowel sounds normal; no masses,  no organomegaly Extremities: extremities normal, atraumatic, no cyanosis or edema Neurologic: Alert and oriented X 3, normal strength and tone. Normal symmetric reflexes. Normal coordination and gait  ECOG PERFORMANCE STATUS: 1 - Symptomatic but completely ambulatory    Blood pressure 121/67, pulse 90, temperature 97.9 F (36.6 C), temperature source Oral, resp. rate 17, height 5' 8" (1.727 m), weight 189 lb (85.73 kg), SpO2 99 %.  LABORATORY DATA: Lab Results  Component Value Date   WBC 4.5 03/30/2015   HGB 12.7 03/30/2015   HCT 39.8 03/30/2015   MCV 85.4 03/30/2015   PLT 221 03/30/2015      Chemistry      Component Value Date/Time   NA 138 02/15/2015 1223   NA 135 10/22/2014 0527   NA 138 02/28/2012 0939   K 3.9 02/15/2015 1223   K 3.5 10/22/2014 0527   K 4.6 02/28/2012 0939   CL 102 10/22/2014 0527   CL 106 12/09/2012 0806   CL 99 02/28/2012 0939   CO2 27 02/15/2015 1223   CO2 27 10/22/2014 0527   CO2 28 02/28/2012 0939   BUN 13.1 02/15/2015 1223   BUN 17 10/22/2014 0527   BUN 12 02/28/2012 0939   CREATININE 0.8 02/15/2015 1223   CREATININE 0.64 10/22/2014 0527   CREATININE 1.0 02/28/2012 0939      Component Value Date/Time   CALCIUM 9.3 02/15/2015 1223   CALCIUM 8.1* 10/22/2014 0527   CALCIUM 9.1 02/28/2012 0939   ALKPHOS 66  02/15/2015 1223   ALKPHOS 72 10/21/2014 0610   ALKPHOS 80 02/28/2012 0939   AST 14 02/15/2015 1223   AST 22 10/21/2014 0610   AST 20 02/28/2012 0939   ALT 10 02/15/2015 1223   ALT 24 10/21/2014 0610   ALT 21 02/28/2012 0939   BILITOT 0.53 02/15/2015 1223   BILITOT 0.5 10/21/2014 0610   BILITOT 1.30 02/28/2012 0939       RADIOGRAPHIC STUDIES: No results found. ASSESSMENT AND PLAN: this is a very pleasant 69 years old African American female with metastatic non-small cell lung cancer adenocarcinoma, with positive EGFR mutation but also resistant mutation T790M. She has been on treatment with oral Tarceva for more than 6 years.  The patient has EGFR resistant mutation T790M and was treated for the last 8 months with [id number] on a clinical trial at [facility] but this was discontinued secondary to inability to follow-up with the protocol requirement. She had no evidence for disease progression on that treatment. The patient is currently on Tagrisso 80 mg by mouth daily status post 4 months and tolerating it fairly well with no significant adverse effects except for occasional diarrhea.  I recommended for her to continue treatment with Tagrisso 80 mg by mouth daily. I will see her back for follow-up visit in one month for reevaluation after repeating CBC and comprehensive metabolic panel. She was advised to call immediately if she has any concerning symptoms in the interval.  The patient voices understanding of current disease status and treatment options and is in agreement with the current care plan.  All questions were answered. The patient knows to call the clinic with any problems, questions or concerns. We can certainly see the patient much sooner if necessary.  Disclaimer: This note was dictated with voice recognition software. Similar sounding words can inadvertently be transcribed and may not be corrected upon review.

## 2015-03-31 DIAGNOSIS — Z1212 Encounter for screening for malignant neoplasm of rectum: Secondary | ICD-10-CM | POA: Diagnosis not present

## 2015-03-31 DIAGNOSIS — Z1211 Encounter for screening for malignant neoplasm of colon: Secondary | ICD-10-CM | POA: Diagnosis not present

## 2015-04-11 DIAGNOSIS — Z86718 Personal history of other venous thrombosis and embolism: Secondary | ICD-10-CM | POA: Diagnosis not present

## 2015-04-11 DIAGNOSIS — G3184 Mild cognitive impairment, so stated: Secondary | ICD-10-CM | POA: Diagnosis not present

## 2015-04-11 DIAGNOSIS — C3490 Malignant neoplasm of unspecified part of unspecified bronchus or lung: Secondary | ICD-10-CM | POA: Diagnosis not present

## 2015-04-12 ENCOUNTER — Telehealth: Payer: Self-pay | Admitting: Internal Medicine

## 2015-04-12 NOTE — Telephone Encounter (Signed)
Faxed pt medical records to [place]

## 2015-04-27 ENCOUNTER — Telehealth: Payer: Self-pay | Admitting: Internal Medicine

## 2015-04-27 ENCOUNTER — Encounter: Payer: Self-pay | Admitting: Internal Medicine

## 2015-04-27 ENCOUNTER — Other Ambulatory Visit (HOSPITAL_BASED_OUTPATIENT_CLINIC_OR_DEPARTMENT_OTHER): Payer: Medicare Other

## 2015-04-27 ENCOUNTER — Ambulatory Visit (HOSPITAL_BASED_OUTPATIENT_CLINIC_OR_DEPARTMENT_OTHER): Payer: Medicare Other | Admitting: Internal Medicine

## 2015-04-27 DIAGNOSIS — C3432 Malignant neoplasm of lower lobe, left bronchus or lung: Secondary | ICD-10-CM | POA: Diagnosis not present

## 2015-04-27 DIAGNOSIS — C7931 Secondary malignant neoplasm of brain: Secondary | ICD-10-CM

## 2015-04-27 DIAGNOSIS — Z5111 Encounter for antineoplastic chemotherapy: Secondary | ICD-10-CM

## 2015-04-27 DIAGNOSIS — C3411 Malignant neoplasm of upper lobe, right bronchus or lung: Secondary | ICD-10-CM

## 2015-04-27 DIAGNOSIS — M810 Age-related osteoporosis without current pathological fracture: Secondary | ICD-10-CM

## 2015-04-27 DIAGNOSIS — C3491 Malignant neoplasm of unspecified part of right bronchus or lung: Secondary | ICD-10-CM

## 2015-04-27 DIAGNOSIS — C7801 Secondary malignant neoplasm of right lung: Secondary | ICD-10-CM

## 2015-04-27 LAB — CBC WITH DIFFERENTIAL/PLATELET
BASO%: 0.7 % (ref 0.0–2.0)
Basophils Absolute: 0 10*3/uL (ref 0.0–0.1)
EOS%: 4.3 % (ref 0.0–7.0)
Eosinophils Absolute: 0.1 10*3/uL (ref 0.0–0.5)
HCT: 42.3 % (ref 34.8–46.6)
HGB: 13.7 g/dL (ref 11.6–15.9)
LYMPH%: 18.3 % (ref 14.0–49.7)
MCH: 28.2 pg (ref 25.1–34.0)
MCHC: 32.3 g/dL (ref 31.5–36.0)
MCV: 87.3 fL (ref 79.5–101.0)
MONO#: 0.3 10*3/uL (ref 0.1–0.9)
MONO%: 9.9 % (ref 0.0–14.0)
NEUT#: 2.1 10*3/uL (ref 1.5–6.5)
NEUT%: 66.8 % (ref 38.4–76.8)
Platelets: 202 10*3/uL (ref 145–400)
RBC: 4.84 10*6/uL (ref 3.70–5.45)
RDW: 15.9 % — ABNORMAL HIGH (ref 11.2–14.5)
WBC: 3.2 10*3/uL — ABNORMAL LOW (ref 3.9–10.3)
lymph#: 0.6 10*3/uL — ABNORMAL LOW (ref 0.9–3.3)

## 2015-04-27 LAB — COMPREHENSIVE METABOLIC PANEL (CC13)
ALT: 15 U/L (ref 0–55)
AST: 17 U/L (ref 5–34)
Albumin: 4.1 g/dL (ref 3.5–5.0)
Alkaline Phosphatase: 64 U/L (ref 40–150)
Anion Gap: 9 mEq/L (ref 3–11)
BUN: 10.2 mg/dL (ref 7.0–26.0)
CO2: 26 mEq/L (ref 22–29)
Calcium: 10.1 mg/dL (ref 8.4–10.4)
Chloride: 107 mEq/L (ref 98–109)
Creatinine: 1 mg/dL (ref 0.6–1.1)
EGFR: 70 mL/min/{1.73_m2} — ABNORMAL LOW (ref >90)
Glucose: 99 mg/dl (ref 70–140)
Potassium: 3.9 mEq/L (ref 3.5–5.1)
Sodium: 142 mEq/L (ref 136–145)
Total Bilirubin: 0.62 mg/dL (ref 0.20–1.20)
Total Protein: 7.1 g/dL (ref 6.4–8.3)

## 2015-04-27 NOTE — Progress Notes (Signed)
[place] [phone]) [phone]   Fax:([phone]  OFFICE PROGRESS NOTE  [name], MD [address] [place] [address]  DIAGNOSIS: Metastatic non-small cell lung cancer with brain metastasis, diagnosed initially a stage IIB adenocarcinoma in October 2002.  BIOMARKERS TESTING ([facility] One): Positive for: EGFR E746-A750 del, T790M, CTNNB1 D32G, SMAD4 T116fs*10 Negative for: RET, ALK, BRAF, KRAS, ERBB2 and MET  PRIOR THERAPY:  1. Status post left lower lobectomy under the care of Dr Arlyce Dice in October 2002. 2. Status post course of concurrent chemoradiation with weekly carboplatin and paclitaxel under the care of Dr Benay Spice and Dr Elba Barman in early 2003. 3. Status post treatment with Celebrex and Iressa according to the clinical trial at [facility] for a total of 1 year. 4. Status post left occipital craniotomy for tumor resection on September 21, 2005, under the care of Dr Christella Noa. 5. Status post whole brain irradiation under the care of Dr Elba Barman, completed November 07, 2005. 6. Status post gamma knife treatment to the resected cavitary recurrence on June 02, 2008, at [facility]. 7. Status post right thyroidectomy with radical neck dissection under the care of Dr. Constance Holster on 10/18/2011. 8. Status post excisional biopsy of right cervical lymph node that was consistent with metastatic adenocarcinoma. 9. Tarceva 150 mg by mouth daily with therapy beginning 06/03/2007, discontinued in July 2015 secondary to disease progression. Status post 79 cycles. 10. palliative radiotherapy to the enlarging right upper lobe lung nodule under the care of Dr. Valere Dross. 11. Treatment at [facility] STUDY [id number]  300 mg by mouth daily for 21 days discontinued secondary to study deviation and inability for the patient to keep her appointment at [facility] secondary to recent hip fractures. 12. Open reduction and internal  fixation of trimalleolar ankle fracture dislocation with fixation of the fibula as well as medial          malleolus.   CURRENT THERAPY:  1) Tagrisso 80 mg by mouth daily, started 11/25/2014, status post 5 months of treatment.  INTERVAL HISTORY: Valerie [name] 69 y.o. female returns to the clinic today for follow up visit. The patient is currently on treatment with Tagrisso 80 mg by mouth daily for the last 5 months and she is tolerating her treatment fairly well with no significant adverse effects. She denied having any significant skin rash but has intermittent diarrhea and she takes Imodium on as-needed basis. She is feeling fine with no specific complaints. She denied having any significant chest pain, shortness of breath, cough or hemoptysis. She denied having any significant nausea or vomiting. She had repeat CBC and comprehensive metabolic panel performed earlier today and she is here for evaluation and discussion of her lab results.  MEDICAL HISTORY: Past Medical History  Diagnosis Date  . Heart murmur   . Shortness of breath     hx lung ca   . Arthritis   . Anal fistula   . H/O osteoporosis   . H/O vitamin D deficiency   . History of measles, mumps, or rubella   . H/O varicella   . Hypertension   . Yeast infection   . Abnormal Pap smear 2006  . Atrophic vaginitis 2008  . Dyspareunia 2008  . Dementia 2009  . Osteoporosis 2010  . Status post chemotherapy 2003    CARBOPLATIN/PACLITAXEL /STATUS POST CLINICAL TRAIL OF CELEBREX AND IRESSA AT [facility] FOR 1 YEAR  . Status post radiation  therapy 2003    LEFT LUNG  . Status post radiation therapy 11/07/2005    WHOLE BRAIN: DR Larkin [facility]  . Status post radiation therapy 06/02/2008    GAMMA KNIFE OF RESECTED CAVITAY  . On antineoplastic chemotherapy     TARCEVA  . Nodule of right lung CT- 06/03/12    RIGHT UPPER LOBE  . Thyroid adenoma     ?  [name] Anxiety   . Cataract   . Nodule of right lung 06/03/12    Upper Lobe  .  History of radiation therapy 07/28/13- 08/10/13    right lung metastasis 5000 cGy 10 sessions  . Ankle fracture   . Lung cancer dx'd 2002  . Metastasis to brain dx'd 2008  . 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 Thyroid cancer 10/18/11 bx    adenoid nodules   . Lung metastasis     PET scan 05/05/13, RUL lung nodule  . Lung cancer   . Lung cancer     ALLERGIES:  has No Known Allergies.  MEDICATIONS:  Current Outpatient Prescriptions  Medication Sig Dispense Refill  . alendronate (FOSAMAX) 70 MG tablet Take 1 tablet (70 mg total) by mouth every 7 (seven) days. Saturdays; Take with a full glass of water on an empty stomach. 4 tablet 12  . cholecalciferol (VITAMIN D) 1000 UNITS tablet Take 1,000 Units by mouth at bedtime.    . donepezil (ARICEPT) 10 MG tablet Take 10 mg by mouth daily.  0  . loperamide (IMODIUM) 2 MG capsule Take 2 mg by mouth.    . Multiple Vitamin (MULTI-VITAMINS) TABS Take by mouth.    [name] osimertinib mesylate (TAGRISSO) 80 MG tablet Take 1 tablet (80 mg total) by mouth daily. 30 tablet 2  . research study medication Take home supply.    [name] 20 MG TABS tablet Take 1 tablet (20 mg total) by mouth daily. 30 tablet 0   No current facility-administered medications for this visit.    SURGICAL HISTORY:  Past Surgical History  Procedure Laterality Date  . Left occipital craniotomy  09/21/2005    tumor  . Left lower lobectomy  05/2001  . Radical neck dissection  10/18/2011    Procedure: RADICAL NECK DISSECTION;  Surgeon: Izora Gala, MD;  Location: [place];  Service: ENT;  Laterality: Right;  RIGHT MODIFIED NECK DISSECTION /POSSIBLE RIGHT THYROIDECTOM  . Thyroidectomy  10/18/2011    Procedure: THYROIDECTOMY WITH RADICAL NECK DISSECTION;  Surgeon: Izora Gala, MD;  Location: [place];  Service: ENT;  Laterality: Right;  . Lung lobectomy    . Neck surgery    . Brain surgery    . Orif ankle fracture Left 10/02/2014    Procedure: OPEN REDUCTION INTERNAL FIXATION (ORIF)  ANKLE FRACTURE;  Surgeon: Alta Corning, MD;  Location: [facility];  Service: Orthopedics;  Laterality: Left;    REVIEW OF SYSTEMS:  A comprehensive review of systems was negative except for: Constitutional: positive for fatigue Gastrointestinal: positive for diarrhea   PHYSICAL EXAMINATION: General appearance: alert, cooperative, fatigued and no distress Head: Normocephalic, without obvious abnormality, atraumatic Neck: no adenopathy Lymph nodes: Cervical, supraclavicular, and axillary nodes normal. Resp: clear to auscultation bilaterally Cardio: regular rate and rhythm, S1, S2 normal, no murmur, click, rub or gallop GI: soft, non-tender; bowel sounds normal; no masses,  no organomegaly Extremities: extremities normal, atraumatic, no cyanosis or edema Neurologic: Alert and oriented X 3, normal strength and tone. Normal symmetric reflexes. Normal coordination and gait  ECOG  PERFORMANCE STATUS: 1 - Symptomatic but completely ambulatory  There were no vitals taken for this visit.  LABORATORY DATA: Lab Results  Component Value Date   WBC 3.2* 04/27/2015   HGB 13.7 04/27/2015   HCT 42.3 04/27/2015   MCV 87.3 04/27/2015   PLT 202 04/27/2015      Chemistry      Component Value Date/Time   NA 138 03/30/2015 1519   NA 135 10/22/2014 0527   NA 138 02/28/2012 0939   K 3.8 03/30/2015 1519   K 3.5 10/22/2014 0527   K 4.6 02/28/2012 0939   CL 102 10/22/2014 0527   CL 106 12/09/2012 0806   CL 99 02/28/2012 0939   CO2 22 03/30/2015 1519   CO2 27 10/22/2014 0527   CO2 28 02/28/2012 0939   BUN 13.1 03/30/2015 1519   BUN 17 10/22/2014 0527   BUN 12 02/28/2012 0939   CREATININE 0.8 03/30/2015 1519   CREATININE 0.64 10/22/2014 0527   CREATININE 1.0 02/28/2012 0939      Component Value Date/Time   CALCIUM 9.6 03/30/2015 1519   CALCIUM 8.1* 10/22/2014 0527   CALCIUM 9.1 02/28/2012 0939   ALKPHOS 65 03/30/2015 1519   ALKPHOS 72 10/21/2014 0610   ALKPHOS 80 02/28/2012 0939   AST 14  03/30/2015 1519   AST 22 10/21/2014 0610   AST 20 02/28/2012 0939   ALT 16 03/30/2015 1519   ALT 24 10/21/2014 0610   ALT 21 02/28/2012 0939   BILITOT 0.43 03/30/2015 1519   BILITOT 0.5 10/21/2014 0610   BILITOT 1.30 02/28/2012 0939       RADIOGRAPHIC STUDIES: No results found. ASSESSMENT AND PLAN: this is a very pleasant 69 years old African American female with metastatic non-small cell lung cancer adenocarcinoma, with positive EGFR mutation but also resistant mutation T790M. She has been on treatment with oral Tarceva for more than 6 years.  The patient has EGFR resistant mutation T790M and was treated for the last 8 months with [id number] on a clinical trial at [facility] but this was discontinued secondary to inability to follow-up with the protocol requirement. She had no evidence for disease progression on that treatment. The patient is currently on Tagrisso 80 mg by mouth daily status post 5 months and tolerating it fairly well with no significant adverse effects except for occasional diarrhea.  I recommended for her to continue treatment with Tagrisso 80 mg by mouth daily. I will see her back for follow-up visit in one month for reevaluation after repeating CBC and comprehensive metabolic panel in addition to CT scan of the neck, chest, abdomen and pelvis for restaging of her disease. She was advised to call immediately if she has any concerning symptoms in the interval.  The patient voices understanding of current disease status and treatment options and is in agreement with the current care plan.  All questions were answered. The patient knows to call the clinic with any problems, questions or concerns. We can certainly see the patient much sooner if necessary.  Disclaimer: This note was dictated with voice recognition software. Similar sounding words can inadvertently be transcribed and may not be corrected upon review.

## 2015-04-27 NOTE — Telephone Encounter (Signed)
Gave adn printed appt sched and avs fo rpt for OCT...the patient preferred water based

## 2015-05-01 IMAGING — PT NM PET TUM IMG RESTAG (PS) SKULL BASE T - THIGH
1 of 5 series · 1 of 25 positions shown · non-contrast
Comparison: CT thorax 04/01/2013, 12/09/2012 PET-CT scan 08/29/2011

CLINICAL DATA: Initial treatment strategy for lung cancer.
Metastatic lung cancer. Personal history of brain metastasis.. Right
hemi thyroidectomy in 8229.

EXAM:
NUCLEAR MEDICINE PET SKULL BASE TO THIGH
FASTING BLOOD GLUCOSE:  Value:  92 mg/dl
TECHNIQUE: 18.3 next healed mCi F-18 FDG was injected intravenously. CT data
was obtained and used for attenuation correction and anatomic
localization only. (This was not acquired as a diagnostic CT
examination.) Additional exam technical data entered on technologist
worksheet.

[Series 2: ct images · axial · 3.8mm · 0.98mm/px · 1 of 267 slices shown]
[im 267/267  brain]
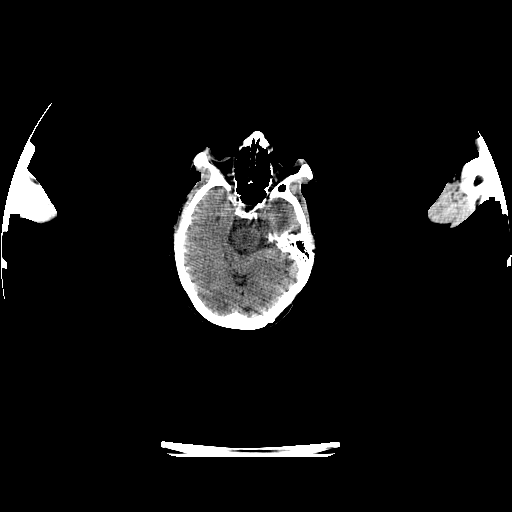

[1 of 25 positions shown; findings below may reference images not displayed]

FINDINGS: NECK

Physiologic hypermetabolic activity associated with the
submandibular glands and left parotid gland. The right parotid gland
is been removed. Physiologic hypermetabolic activity associated the
glottis. There is a hypermetabolic activity associated with the left
lobe of thyroid gland following right hemithyroidectomy likely
related to thyroiditis.

There is a hypermetabolic lymph node within the right neck (level V)
superficial to the scalene muscle measuring 11 mm (image 52) with
SUV max = max 5.7.

CHEST

Hypermetabolic right upper lobe pulmonary nodule measuring 13 mm
(image 71) with SUV max = 4.3. Sub cm left upper lobe nodule
measures 4 mm (image [id number]). This is too small to characterize by PET.
9 mm nodule at the left lung base (image 89) does not have
associated metabolic activity. There are no hypermetabolic
mediastinal lymph nodes.

ABDOMEN/PELVIS

No abnormal hypermetabolic activity within the liver, pancreas,
adrenal glands, or spleen. No hypermetabolic lymph nodes in the
abdomen or pelvis.

SKELETON

No focal hypermetabolic activity to suggest skeletal metastasis.
IMPRESSION: 1. Hypermetabolic right upper lobe pulmonary nodule consistent with
pulmonary metastasis.

2. Smaller Left lung nodules are not hypermetabolic but remain
suspicious.

3. Hypermetabolic right level V lymph node consistent with nodal
metastasis.

4.  No evidence of distant metastasis.

5. Activity associated with the left lobe of thyroid gland is likely
physiologic or related to thyroiditis

## 2015-05-23 ENCOUNTER — Ambulatory Visit (HOSPITAL_COMMUNITY): Payer: Medicare Other

## 2015-05-25 ENCOUNTER — Encounter (HOSPITAL_COMMUNITY): Payer: Self-pay

## 2015-05-25 ENCOUNTER — Other Ambulatory Visit (HOSPITAL_BASED_OUTPATIENT_CLINIC_OR_DEPARTMENT_OTHER): Payer: Medicare Other

## 2015-05-25 ENCOUNTER — Ambulatory Visit (HOSPITAL_COMMUNITY)
Admission: RE | Admit: 2015-05-25 | Discharge: 2015-05-25 | Disposition: A | Discharge status: Home / Self Care | Payer: Medicare Other | Source: Ambulatory Visit | Attending: Internal Medicine | Admitting: Internal Medicine

## 2015-05-25 DIAGNOSIS — G9389 Other specified disorders of brain: Secondary | ICD-10-CM | POA: Diagnosis not present

## 2015-05-25 DIAGNOSIS — C7801 Secondary malignant neoplasm of right lung: Secondary | ICD-10-CM | POA: Diagnosis not present

## 2015-05-25 DIAGNOSIS — C3432 Malignant neoplasm of lower lobe, left bronchus or lung: Secondary | ICD-10-CM

## 2015-05-25 DIAGNOSIS — I722 Aneurysm of renal artery: Secondary | ICD-10-CM | POA: Insufficient documentation

## 2015-05-25 DIAGNOSIS — D259 Leiomyoma of uterus, unspecified: Secondary | ICD-10-CM | POA: Insufficient documentation

## 2015-05-25 DIAGNOSIS — J90 Pleural effusion, not elsewhere classified: Secondary | ICD-10-CM | POA: Diagnosis not present

## 2015-05-25 DIAGNOSIS — R918 Other nonspecific abnormal finding of lung field: Secondary | ICD-10-CM | POA: Insufficient documentation

## 2015-05-25 DIAGNOSIS — C3411 Malignant neoplasm of upper lobe, right bronchus or lung: Secondary | ICD-10-CM | POA: Insufficient documentation

## 2015-05-25 DIAGNOSIS — C7931 Secondary malignant neoplasm of brain: Secondary | ICD-10-CM | POA: Diagnosis not present

## 2015-05-25 DIAGNOSIS — Q630 Accessory kidney: Secondary | ICD-10-CM | POA: Insufficient documentation

## 2015-05-25 DIAGNOSIS — I7090 Unspecified atherosclerosis: Secondary | ICD-10-CM | POA: Diagnosis not present

## 2015-05-25 DIAGNOSIS — Z5111 Encounter for antineoplastic chemotherapy: Secondary | ICD-10-CM

## 2015-05-25 DIAGNOSIS — Z85850 Personal history of malignant neoplasm of thyroid: Secondary | ICD-10-CM | POA: Insufficient documentation

## 2015-05-25 DIAGNOSIS — C719 Malignant neoplasm of brain, unspecified: Secondary | ICD-10-CM | POA: Diagnosis not present

## 2015-05-25 DIAGNOSIS — M40204 Unspecified kyphosis, thoracic region: Secondary | ICD-10-CM | POA: Diagnosis not present

## 2015-05-25 DIAGNOSIS — C3491 Malignant neoplasm of unspecified part of right bronchus or lung: Secondary | ICD-10-CM

## 2015-05-25 DIAGNOSIS — C3490 Malignant neoplasm of unspecified part of unspecified bronchus or lung: Secondary | ICD-10-CM | POA: Diagnosis not present

## 2015-05-25 LAB — CBC WITH DIFFERENTIAL/PLATELET
BASO%: 0.6 % (ref 0.0–2.0)
Basophils Absolute: 0 10*3/uL (ref 0.0–0.1)
EOS%: 7.8 % — ABNORMAL HIGH (ref 0.0–7.0)
Eosinophils Absolute: 0.3 10*3/uL (ref 0.0–0.5)
HCT: 41.2 % (ref 34.8–46.6)
HGB: 13.1 g/dL (ref 11.6–15.9)
LYMPH%: 21.8 % (ref 14.0–49.7)
MCH: 29.2 pg (ref 25.1–34.0)
MCHC: 31.8 g/dL (ref 31.5–36.0)
MCV: 91.8 fL (ref 79.5–101.0)
MONO#: 0.5 10*3/uL (ref 0.1–0.9)
MONO%: 12.9 % (ref 0.0–14.0)
NEUT#: 2 10*3/uL (ref 1.5–6.5)
NEUT%: 56.9 % (ref 38.4–76.8)
Platelets: 188 10*3/uL (ref 145–400)
RBC: 4.49 10*6/uL (ref 3.70–5.45)
RDW: 14.8 % — ABNORMAL HIGH (ref 11.2–14.5)
WBC: 3.6 10*3/uL — ABNORMAL LOW (ref 3.9–10.3)
lymph#: 0.8 10*3/uL — ABNORMAL LOW (ref 0.9–3.3)

## 2015-05-25 LAB — COMPREHENSIVE METABOLIC PANEL (CC13)
ALT: 13 U/L (ref 0–55)
AST: 13 U/L (ref 5–34)
Albumin: 4.1 g/dL (ref 3.5–5.0)
Alkaline Phosphatase: 62 U/L (ref 40–150)
Anion Gap: 8 mEq/L (ref 3–11)
BUN: 12.5 mg/dL (ref 7.0–26.0)
CO2: 26 mEq/L (ref 22–29)
Calcium: 9.6 mg/dL (ref 8.4–10.4)
Chloride: 105 mEq/L (ref 98–109)
Creatinine: 0.9 mg/dL (ref 0.6–1.1)
EGFR: 79 mL/min/{1.73_m2} — ABNORMAL LOW (ref >90)
Glucose: 87 mg/dl (ref 70–140)
Potassium: 3.8 mEq/L (ref 3.5–5.1)
Sodium: 139 mEq/L (ref 136–145)
Total Bilirubin: 0.62 mg/dL (ref 0.20–1.20)
Total Protein: 7 g/dL (ref 6.4–8.3)

## 2015-05-25 MED ORDER — IOHEXOL 300 MG/ML  SOLN
100.0000 mL | Freq: Once | INTRAMUSCULAR | Status: AC | PRN
  Administered 2015-05-25: 100 mL via INTRAVENOUS

## 2015-05-25 MED ORDER — IOHEXOL 300 MG/ML  SOLN
50.0000 mL | Freq: Once | INTRAMUSCULAR | Status: DC | PRN
  Administered 2015-05-25: 50 mL via ORAL
  Filled 2015-05-25: qty 50

## 2015-05-30 ENCOUNTER — Other Ambulatory Visit: Payer: Self-pay | Admitting: Internal Medicine

## 2015-05-31 ENCOUNTER — Ambulatory Visit (HOSPITAL_BASED_OUTPATIENT_CLINIC_OR_DEPARTMENT_OTHER): Payer: Medicare Other | Admitting: Internal Medicine

## 2015-05-31 ENCOUNTER — Ambulatory Visit (HOSPITAL_BASED_OUTPATIENT_CLINIC_OR_DEPARTMENT_OTHER): Payer: Medicare Other

## 2015-05-31 ENCOUNTER — Encounter: Payer: Self-pay | Admitting: Internal Medicine

## 2015-05-31 ENCOUNTER — Telehealth: Payer: Self-pay | Admitting: Internal Medicine

## 2015-05-31 ENCOUNTER — Other Ambulatory Visit: Payer: Self-pay | Admitting: Pharmacist

## 2015-05-31 VITALS — BP 128/60 | HR 97 | Temp 97.8°F | Resp 18 | Ht 68.0 in | Wt 194.6 lb

## 2015-05-31 DIAGNOSIS — C3432 Malignant neoplasm of lower lobe, left bronchus or lung: Secondary | ICD-10-CM

## 2015-05-31 DIAGNOSIS — C3411 Malignant neoplasm of upper lobe, right bronchus or lung: Secondary | ICD-10-CM

## 2015-05-31 DIAGNOSIS — C7931 Secondary malignant neoplasm of brain: Secondary | ICD-10-CM | POA: Diagnosis not present

## 2015-05-31 DIAGNOSIS — R42 Dizziness and giddiness: Secondary | ICD-10-CM | POA: Diagnosis not present

## 2015-05-31 DIAGNOSIS — Z23 Encounter for immunization: Secondary | ICD-10-CM | POA: Diagnosis present

## 2015-05-31 DIAGNOSIS — Z5111 Encounter for antineoplastic chemotherapy: Secondary | ICD-10-CM

## 2015-05-31 DIAGNOSIS — C7800 Secondary malignant neoplasm of unspecified lung: Secondary | ICD-10-CM

## 2015-05-31 MED ORDER — INFLUENZA VAC SPLIT QUAD 0.5 ML IM SUSY
0.5000 mL | PREFILLED_SYRINGE | Freq: Once | INTRAMUSCULAR | Status: AC
  Administered 2015-05-31: 0.5 mL via INTRAMUSCULAR
  Filled 2015-05-31: qty 0.5

## 2015-05-31 MED ORDER — PNEUMOCOCCAL 13-VAL CONJ VACC IM SUSP
0.5000 mL | Freq: Once | INTRAMUSCULAR | Status: AC
  Administered 2015-05-31: 0.5 mL via INTRAMUSCULAR
  Filled 2015-05-31: qty 0.5

## 2015-05-31 NOTE — Progress Notes (Signed)
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  BIOMARKERS TESTING ([facility]): Positive for: EGFR [id number] del, T790M, CTNNB1 D32G, SMAD4 T141f*10 Negative for: RET, ALK, BRAF, KRAS, ERBB2 and MET  PRIOR THERAPY:  1. Status post left lower lobectomy under the care of Dr BArlyce Dicein October 2002. 2. Status post course of concurrent chemoradiation with weekly carboplatin and paclitaxel under the care of Dr SBenay Spiceand Dr WElba Barmanin early 2003. 3. Status post treatment [facility] 4. Status post left occipital craniotomy for tumor resection on September 21, 2005, under the care of Dr CChristella Noa 5. Status post whole brain irradiation under the care of Dr WElba Barman completed November 07, 2005. 6. Status post gamma knife [facility] the [facility]. Status post right thyroidectomy with radical neck dissection under the care of Dr. RConstance Holsteron 10/18/2011. 8. Status post excisional biopsy of right cervical lymph node that was consistent with metastatic adenocarcinoma. 9. Tarceva 150 mg by mouth daily with therapy beginning 06/03/2007, discontinued in July 2015 secondary to disease progression. Status post 79 cycles. 10. palliative radiotherapy to the enlarging right upper lobe lung nodule under the care of Dr. MValere Dross [facility]. [id number] 300 mg by mouth daily for 21 days discontinued secondary to study deviation and [facility]. 12. Open reduction and internal  fixation of trimalleolar ankle fracture dislocation with fixation of the fibula as well as medial          malleolus.   CURRENT THERAPY:  1) Tagrisso 80 mg by mouth daily, started 11/25/2014, status post 6 months of treatment.  INTERVAL HISTORY: JDAYLAN BOGGESS69y.[id number]. female returns to the clinic today for follow up visit. The patient is currently on treatment with Tagrisso 80 mg by mouth daily for the last 6 months and she is tolerating her treatment fairly well with no significant adverse effects. She denied having any significant skin rash or diarrhea. She is feeling fine with no specific complaints except for occasional dizzy spells and some visual changes recently. She denied having any significant chest pain, shortness of breath, cough or hemoptysis. She denied having any significant nausea or vomiting. The patient has repeat CT scan of the chest, abdomen and pelvis performed recently and she is here for evaluation and discussion of her scan results.  MEDICAL HISTORY: Past Medical History  Diagnosis Date  . Heart murmur   . Shortness of breath     hx lung ca   . Arthritis   . Anal fistula   . H/O osteoporosis   . H/O vitamin D deficiency   . History of measles, mumps, or rubella   . H/O varicella   . Hypertension   . Yeast infection   . Abnormal Pap smear 2006  . Atrophic vaginitis 2008  . Dyspareunia 2008  . Dementia 2009  . Osteoporosis 2010  . Status post chemotherapy 2003    CARBOPLATIN/PACLITAXEL /STATUS POST CLINICAL TRAIL OF CELEBREX AND IRESSA AT [facility] FOR 1 YEAR  .  Status post radiation therapy 2003    LEFT LUNG  . Status post radiation therapy 11/07/2005    WHOLE BRAIN: DR Larkin [facility]  . Status post radiation therapy 06/02/2008    GAMMA KNIFE OF RESECTED CAVITAY  . On antineoplastic chemotherapy     TARCEVA  . Nodule of right lung CT- 06/03/12    RIGHT UPPER LOBE  . Thyroid adenoma     ?  [name] Anxiety   . Cataract   . Nodule of right lung 06/03/12     Upper Lobe  . History of radiation therapy 07/28/13- 08/10/13    right lung metastasis 5000 cGy 10 sessions  . Ankle fracture   . Lung cancer ([place]) dx'd 2002  . Metastasis to brain [facility]) dx'd 2008  . 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 Thyroid cancer ([place]) 10/18/11 bx    adenoid nodules   . Lung metastasis ([place])     PET scan 05/05/13, RUL lung nodule  . Lung cancer ([place])   . Lung cancer ([place])     ALLERGIES:  has No Known Allergies.  MEDICATIONS:  Current Outpatient Prescriptions  Medication Sig Dispense Refill  . alendronate (FOSAMAX) 70 MG tablet Take 1 tablet (70 mg total) by mouth every 7 (seven) days. Saturdays; Take with a full glass of water on an empty stomach. 4 tablet 12  . cholecalciferol (VITAMIN D) 1000 UNITS tablet Take 1,000 Units by mouth at bedtime.    . donepezil (ARICEPT) 10 MG tablet Take 10 mg by mouth daily.  0  . loperamide (IMODIUM) 2 MG capsule Take 2 mg by mouth.    . Multiple Vitamin (MULTI-VITAMINS) TABS Take by mouth.    . research study medication Take home supply.    [name] TAGRISSO 80 MG tablet TAKE 1 TABLET BY MOUTH ONCE DAILY 30 tablet 2  . XARELTO 20 MG TABS tablet Take 1 tablet (20 mg total) by mouth daily. 30 tablet 0   No current facility-administered medications for this visit.    SURGICAL HISTORY:  Past Surgical History  Procedure Laterality Date  . Left occipital craniotomy  09/21/2005    tumor  . Left lower lobectomy  05/2001  . Radical neck dissection  10/18/2011    Procedure: RADICAL NECK DISSECTION;  Surgeon: Izora Gala, MD;  Location: [place];  Service: ENT;  Laterality: Right;  RIGHT MODIFIED NECK DISSECTION /POSSIBLE RIGHT THYROIDECTOM  . Thyroidectomy  10/18/2011    Procedure: THYROIDECTOMY WITH RADICAL NECK DISSECTION;  Surgeon: Izora Gala, MD;  Location: [place];  Service: ENT;  Laterality: Right;  . Lung lobectomy    . Neck surgery    . Brain surgery    . Orif ankle fracture Left 10/02/2014    Procedure: OPEN REDUCTION  INTERNAL FIXATION (ORIF) ANKLE FRACTURE;  Surgeon: Alta Corning, MD;  Location: [facility];  Service: Orthopedics;  Laterality: Left;    REVIEW OF SYSTEMS:  Constitutional: positive for fatigue Eyes: positive for visual disturbance Ears, nose, mouth, throat, and face: negative Respiratory: negative Cardiovascular: negative Gastrointestinal: negative Genitourinary:negative Integument/breast: negative Hematologic/lymphatic: negative Musculoskeletal:negative Neurological: positive for dizziness Behavioral/Psych: negative Endocrine: negative Allergic/Immunologic: negative   PHYSICAL EXAMINATION: General appearance: alert, cooperative, fatigued and no distress Head: Normocephalic, without obvious abnormality, atraumatic Neck: no adenopathy Lymph nodes: Cervical, supraclavicular, and axillary nodes normal. Resp: clear to auscultation bilaterally Cardio: regular rate and rhythm, S1, S2 normal, no murmur, click, rub or gallop GI: soft, non-tender; bowel sounds normal; no masses,  no organomegaly  Extremities: extremities normal, atraumatic, no cyanosis or edema Neurologic: Alert and oriented X 3, normal strength and tone. Normal symmetric reflexes. Normal coordination and gait  ECOG PERFORMANCE STATUS: 1 - Symptomatic but completely ambulatory  Blood pressure 128/60, pulse 97, temperature 97.8 F (36.6 C), temperature source Oral, resp. rate 18, height $RemoveBe'5\' 8"'vVHaKlUrd$  (1.727 m), weight 194 lb 9.6 oz (88.27 kg), SpO2 99 %.  LABORATORY DATA: Lab Results  Component Value Date   WBC 3.6* 05/25/2015   HGB 13.1 05/25/2015   HCT 41.2 05/25/2015   MCV 91.8 05/25/2015   PLT 188 05/25/2015      Chemistry      Component Value Date/Time   NA 139 05/25/2015 0905   NA 135 10/22/2014 0527   NA 138 02/28/2012 0939   K 3.8 05/25/2015 0905   K 3.5 10/22/2014 0527   K 4.6 02/28/2012 0939   CL 102 10/22/2014 0527   CL 106 12/09/2012 0806   CL 99 02/28/2012 0939   CO2 26 05/25/2015 0905   CO2 27  10/22/2014 0527   CO2 28 02/28/2012 0939   BUN 12.5 05/25/2015 0905   BUN 17 10/22/2014 0527   BUN 12 02/28/2012 0939   CREATININE 0.9 05/25/2015 0905   CREATININE 0.64 10/22/2014 0527   CREATININE 1.0 02/28/2012 0939      Component Value Date/Time   CALCIUM 9.6 05/25/2015 0905   CALCIUM 8.1* 10/22/2014 0527   CALCIUM 9.1 02/28/2012 0939   ALKPHOS 62 05/25/2015 0905   ALKPHOS 72 10/21/2014 0610   ALKPHOS 80 02/28/2012 0939   AST 13 05/25/2015 0905   AST 22 10/21/2014 0610   AST 20 02/28/2012 0939   ALT 13 05/25/2015 0905   ALT 24 10/21/2014 0610   ALT 21 02/28/2012 0939   BILITOT 0.62 05/25/2015 0905   BILITOT 0.5 10/21/2014 0610   BILITOT 1.30 02/28/2012 0939       RADIOGRAPHIC STUDIES: Ct Soft Tissue Neck W Contrast  05/25/2015   CLINICAL DATA:  Metastatic non-small-cell lung cancer to brain and lymph nodes history of thyroid cancer. Subsequent encounter.  EXAM: CT NECK WITH CONTRAST  TECHNIQUE: Multidetector CT imaging of the neck was performed using the standard protocol following the bolus administration of intravenous contrast.  CONTRAST:  153mL OMNIPAQUE IOHEXOL 300 MG/ML  SOLN  COMPARISON:  01/20/2014.  FINDINGS: Pharynx and larynx: No mucosal lesion or focal cord abnormality.  Salivary glands: There is atrophy and fatty replacement of the RIGHT parotid gland. The LEFT parotid appears normal. Unremarkable submandibular glands.  Thyroid: RIGHT hemithyroidectomy.  No LEFT-sided abnormalities.  Lymph nodes: No pathologic enlargement.  Vascular: Patency of the carotid arteries is established. There is moderate atheromatous change at the carotid bifurcations, worse on the RIGHT. Absent RIGHT internal jugular vein.  Limited intracranial: Small calcifications LEFT occipital lobe is stable. Prior LEFT suboccipital craniotomy.  Visualized orbits: Not included.  Mastoids and visualized paranasal sinuses: No air-fluid level.  Skeleton: No worrisome osseous lesions. Intervertebral disc  spaces are preserved.  IMPRESSION: No evidence for recurrent non-small-cell lung cancer to the neck.   Electronically Signed   By: Staci Righter M.D.   On: 05/25/2015 10:36   Ct Chest W Contrast  05/25/2015   CLINICAL DATA:  Non-small-cell lung cancer metastatic to the brain. First diagnosed in 2002. Thyroid cancer diagnosed in 2013. Oral chemotherapy in progress. Subsequent encounter.  EXAM: CT CHEST, ABDOMEN, AND PELVIS WITH CONTRAST  TECHNIQUE: Multidetector CT imaging of the chest, abdomen and pelvis was performed following  the standard protocol during bolus administration of intravenous contrast.  CONTRAST:  170mL OMNIPAQUE IOHEXOL 300 MG/ML  SOLN  COMPARISON:  CT 02/15/2015 and 11/15/2014.  FINDINGS: CT CHEST FINDINGS  Mediastinum/Nodes: There are no enlarged mediastinal, hilar or axillary lymph nodes. The right thyroid lobe appears to be surgically absent. The left thyroid lobe and esophagus demonstrate no significant findings. The heart size is normal. There is no pericardial effusion. There are no significant vascular findings.  Lungs/Pleura: Small dependent left pleural effusion appears unchanged.There is stable volume loss and paramediastinal radiation change in the left hemithorax status post left lower lobe resection. Paramediastinal radiation changes in the right upper lobe are also stable with hilar distortion and narrowing of the bronchus. There are stable scattered tiny pulmonary nodules bilaterally. No new or enlarging nodules identified.  Musculoskeletal/Chest wall: No chest wall mass or suspicious osseous findings. Thoracic kyphosis and degenerative changes are noted. There is a stable deformity of the mid sternum from previous fracture.  CT ABDOMEN AND PELVIS FINDINGS  Hepatobiliary: No suspicious hepatic findings. There is stable tiny low-density lesion in the left lobe on image 53. No evidence of gallstones, gallbladder wall thickening or biliary dilatation.  Pancreas: Unremarkable. No  pancreatic ductal dilatation or surrounding inflammatory changes.  Spleen: Normal in size without focal abnormality.  Adrenals/Urinary Tract: Both adrenal glands appear normal. There is no evidence of renal mass, hydronephrosis or urinary tract calculus. 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 The bladder appears unchanged.  Stomach/Bowel: No evidence of bowel wall thickening, distention or surrounding inflammatory change. The appendix appears normal.  Vascular/Lymphatic: There are no enlarged abdominal or pelvic lymph nodes. 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 No other significant vascular findings.  Reproductive: Stable posterior uterine fibroid, best seen on the sagittal images. No adnexal mass.  Other: No evidence of abdominal wall mass or hernia.  Musculoskeletal: No acute or significant osseous findings. Stable chronic superior endplate compression deformity at L4.  IMPRESSION: 1. Stable postsurgical and post radiation changes in the chest. No evidence of local recurrence or metastatic disease. 2. Stable small left pleural effusion and scattered small pulmonary nodules. 3. No acute or suspicious findings in the abdomen or pelvis. 4. Resolved left-sided caliectasis. 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 5. Stable incidental findings including atherosclerosis, uterine fibroid and chronic fractures.   Electronically Signed   By: Richardean Sale M.D.   On: 05/25/2015 12:02   Ct Abdomen Pelvis W Contrast  05/25/2015   CLINICAL DATA:  Non-small-cell lung cancer metastatic to the brain. First diagnosed in 2002. Thyroid cancer diagnosed in 2013. Oral chemotherapy in progress. Subsequent encounter.  EXAM: CT CHEST, ABDOMEN, AND PELVIS WITH CONTRAST  TECHNIQUE: Multidetector CT imaging of the chest, abdomen and pelvis was performed following the standard protocol during bolus administration of intravenous contrast.  CONTRAST:  133mL OMNIPAQUE  IOHEXOL 300 MG/ML  SOLN  COMPARISON:  CT 02/15/2015 and 11/15/2014.  FINDINGS: CT CHEST FINDINGS  Mediastinum/Nodes: There are no enlarged mediastinal, hilar or axillary lymph nodes. The right thyroid lobe appears to be surgically absent. The left thyroid lobe and esophagus demonstrate no significant findings. The heart size is normal. There is no pericardial effusion. There are no significant vascular findings.  Lungs/Pleura: Small dependent left pleural effusion appears unchanged.There is stable volume loss and paramediastinal radiation change in the left hemithorax status post left lower lobe resection. Paramediastinal radiation changes in the right upper lobe are also stable with hilar  distortion and narrowing of the bronchus. There are stable scattered tiny pulmonary nodules bilaterally. No new or enlarging nodules identified.  Musculoskeletal/Chest wall: No chest wall mass or suspicious osseous findings. Thoracic kyphosis and degenerative changes are noted. There is a stable deformity of the mid sternum from previous fracture.  CT ABDOMEN AND PELVIS FINDINGS  Hepatobiliary: No suspicious hepatic findings. There is stable tiny low-density lesion in the left lobe on image 53. No evidence of gallstones, gallbladder wall thickening or biliary dilatation.  Pancreas: Unremarkable. No pancreatic ductal dilatation or surrounding inflammatory changes.  Spleen: Normal in size without focal abnormality.  Adrenals/Urinary Tract: Both adrenal glands appear normal. There is no evidence of renal mass, hydronephrosis or urinary tract calculus. 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 The bladder appears unchanged.  Stomach/Bowel: No evidence of bowel wall thickening, distention or surrounding inflammatory change. The appendix appears normal.  Vascular/Lymphatic: There are no enlarged abdominal or pelvic lymph nodes. 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 No other significant vascular findings.   Reproductive: Stable posterior uterine fibroid, best seen on the sagittal images. No adnexal mass.  Other: No evidence of abdominal wall mass or hernia.  Musculoskeletal: No acute or significant osseous findings. Stable chronic superior endplate compression deformity at L4.  IMPRESSION: 1. Stable postsurgical and post radiation changes in the chest. No evidence of local recurrence or metastatic disease. 2. Stable small left pleural effusion and scattered small pulmonary nodules. 3. No acute or suspicious findings in the abdomen or pelvis. 4. Resolved left-sided caliectasis. 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 5. Stable incidental findings including atherosclerosis, uterine fibroid and chronic fractures.   Electronically Signed   By: Richardean Sale M.D.   On: 05/25/2015 12:02   ASSESSMENT AND PLAN: this is a very pleasant 69 years old [place] American female with metastatic non-small cell lung cancer adenocarcinoma, with positive EGFR mutation but also resistant mutation T790M. She has been on treatment with oral Tarceva for more than 6 years.  The patient has EGFR resistant mutation T790M and was treated for the last 8 months with [id number] on a clinical trial at [facility] but this was discontinued secondary to inability to follow-up with the protocol requirement. She had no evidence for disease progression on that treatment. The patient is currently on Tagrisso 80 mg by mouth daily status post 6 months and tolerating it fairly well with no significant adverse effects.  The recent CT scan of the chest, abdomen and pelvis showed no evidence for disease progression. I discussed the scan results with the patient today and give her a copy of her reports. I recommended for her to continue treatment with Tagrisso 80 mg by mouth daily. For the visual changes and dizziness, I will order MRI of the brain to rule out recurrent brain metastasis. The patient requested flu shot  and pneumonia vaccine today. These would be given to her in the clinic today. I will see her back for follow-up visit in one month for reevaluation after repeating CBC and comprehensive metabolic panel. She was advised to call immediately if she has any concerning symptoms in the interval.  The patient voices understanding of current disease status and treatment options and is in agreement with the current care plan.  All questions were answered. The patient knows to call the clinic with any problems, questions or concerns. We can certainly see the patient much sooner if necessary.  Disclaimer: This note was dictated  with voice recognition software. Similar sounding words can inadvertently be transcribed and may not be corrected upon review.

## 2015-05-31 NOTE — Patient Instructions (Signed)
Pneumococcal Vaccine, Polyvalent suspension for injection  What is this medicine?  PNEUMOCOCCAL VACCINE (NEU mo KOK al vak SEEN) is a vaccine used to prevent pneumococcus bacterial infections. These bacteria can cause serious infections like pneumonia, meningitis, and blood infections. This vaccine will lower your chance of getting pneumonia. If you do get pneumonia, it can make your symptoms milder and your illness shorter. This vaccine will not treat an infection and will not cause infection. This vaccine is recommended for infants and young children, adults with certain medical conditions, and adults 65 years or older.  This medicine may be used for other purposes; ask your health care provider or pharmacist if you have questions.  What should I tell my health care provider before I take this medicine?  They need to know if you have any of these conditions:  -bleeding problems  -fever  -immune system problems  -an unusual or allergic reaction to pneumococcal vaccine, diphtheria toxoid, other vaccines, latex, other medicines, foods, dyes, or preservatives  -pregnant or trying to get pregnant  -breast-feeding  How should I use this medicine?  This vaccine is for injection into a muscle. It is given by a health care professional.  A copy of Vaccine Information Statements will be given before each vaccination. Read this sheet carefully each time. The sheet may change frequently.  Talk to your pediatrician regarding the use of this medicine in children. While this drug may be prescribed for children as young as 6 weeks old for selected conditions, precautions do apply.  Overdosage: If you think you have taken too much of this medicine contact a poison control center or emergency room at once.  NOTE: This medicine is only for you. Do not share this medicine with others.  What if I miss a dose?  It is important not to miss your dose. Call your doctor or health care professional if you are unable to keep an  appointment.  What may interact with this medicine?  -medicines for cancer chemotherapy  -medicines that suppress your immune function  -steroid medicines like prednisone or cortisone  This list may not describe all possible interactions. Give your health care provider a list of all the medicines, herbs, non-prescription drugs, or dietary supplements you use. Also tell them if you smoke, drink alcohol, or use illegal drugs. Some items may interact with your medicine.  What should I watch for while using this medicine?  Mild fever and pain should go away in 3 days or less. Report any unusual symptoms to your doctor or health care professional.  What side effects may I notice from receiving this medicine?  Side effects that you should report to your doctor or health care professional as soon as possible:  -allergic reactions like skin rash, itching or hives, swelling of the face, lips, or tongue  -breathing problems  -confused  -fast or irregular heartbeat  -fever over 102 degrees F  -seizures  -unusual bleeding or bruising  -unusual muscle weakness  Side effects that usually do not require medical attention (report to your doctor or health care professional if they continue or are bothersome):  -aches and pains  -diarrhea  -fever of 102 degrees F or less  -headache  -irritable  -loss of appetite  -pain, tender at site where injected  -trouble sleeping  This list may not describe all possible side effects. Call your doctor for medical advice about side effects. You may report side effects to FDA at 1-800-FDA-1088.  Where should I keep   my medicine?  This does not apply. This vaccine is given in a clinic, pharmacy, doctor's office, or other health care setting and will not be stored at home.  NOTE: This sheet is a summary. It may not cover all possible information. If you have questions about this medicine, talk to your doctor, pharmacist, or health care provider.     © 2016, [employer]. (2014-05-13  10:27:27)

## 2015-05-31 NOTE — Addendum Note (Signed)
Addended by: Carolynne Edouard B on: 05/31/2015 10:39 AM   Modules accepted: Orders

## 2015-05-31 NOTE — Telephone Encounter (Signed)
Gave patient avs report and appointments for November. [facility] will call with mri appointment - patient.

## 2015-06-08 ENCOUNTER — Ambulatory Visit (HOSPITAL_COMMUNITY)
Admission: RE | Admit: 2015-06-08 | Discharge: 2015-06-08 | Disposition: A | Discharge status: Home / Self Care | Payer: Medicare Other | Source: Ambulatory Visit | Attending: Internal Medicine | Admitting: Internal Medicine

## 2015-06-08 DIAGNOSIS — C3411 Malignant neoplasm of upper lobe, right bronchus or lung: Secondary | ICD-10-CM | POA: Diagnosis not present

## 2015-06-08 DIAGNOSIS — G9389 Other specified disorders of brain: Secondary | ICD-10-CM | POA: Diagnosis not present

## 2015-06-08 DIAGNOSIS — C3490 Malignant neoplasm of unspecified part of unspecified bronchus or lung: Secondary | ICD-10-CM | POA: Diagnosis not present

## 2015-06-08 DIAGNOSIS — Z9221 Personal history of antineoplastic chemotherapy: Secondary | ICD-10-CM | POA: Insufficient documentation

## 2015-06-08 DIAGNOSIS — Z5111 Encounter for antineoplastic chemotherapy: Secondary | ICD-10-CM

## 2015-06-08 DIAGNOSIS — R42 Dizziness and giddiness: Secondary | ICD-10-CM | POA: Diagnosis present

## 2015-06-08 DIAGNOSIS — C7931 Secondary malignant neoplasm of brain: Secondary | ICD-10-CM | POA: Diagnosis not present

## 2015-06-08 DIAGNOSIS — H539 Unspecified visual disturbance: Secondary | ICD-10-CM | POA: Diagnosis present

## 2015-06-08 MED ORDER — GADOBENATE DIMEGLUMINE 529 MG/ML IV SOLN
20.0000 mL | Freq: Once | INTRAVENOUS | Status: AC | PRN
Start: 2015-06-08 — End: 2015-06-08
  Administered 2015-06-08: 18 mL via INTRAVENOUS

## 2015-06-27 IMAGING — MR MR HEAD WO/W CM
9 of 11 series · 34 of 48 positions shown · IV contrast (19ml Multihance)
Comparison: CT of the head September 04, 2012 and MRI of the brain
August 20, 2011

CLINICAL DATA: History of lung cancer and thyroid cancer, history
of brain surgery for metastasis in 4003.

EXAM:
MRI HEAD WITHOUT AND WITH CONTRAST
TECHNIQUE: Multiplanar, multiecho pulse sequences of the brain and surrounding
structures were obtained without and with intravenous contrast.
CONTRAST:  19mL MULTIHANCE GADOBENATE DIMEGLUMINE 529 MG/ML IV SOLN

[Series 2: t1_se_sag · sagittal · 5.0mm · 0.45mm/px · 3 of 21 slices shown]
[im 1/21]
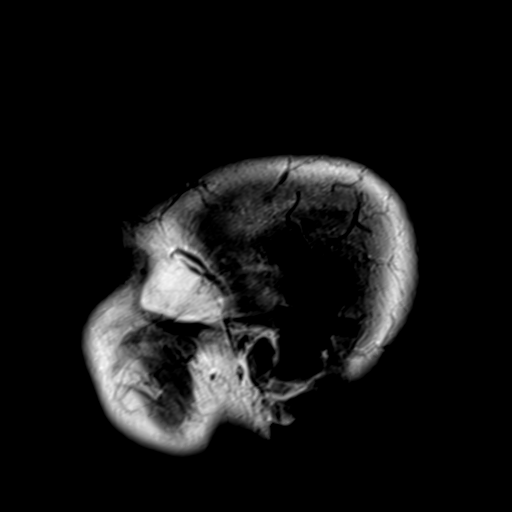
[im 11/21]
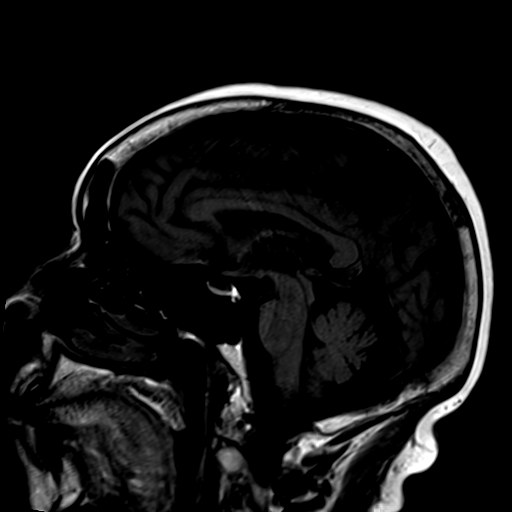
[im 21/21]
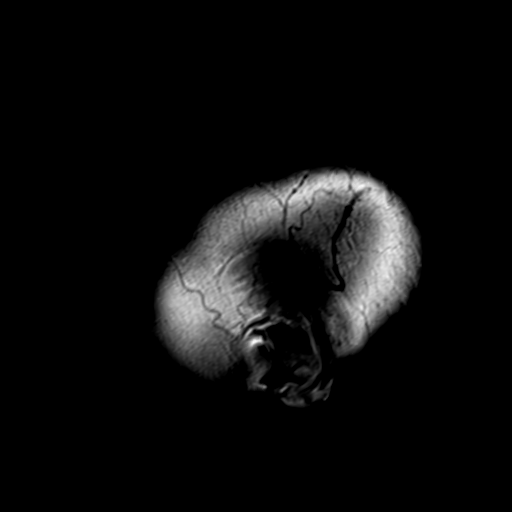

[Series 3: ep2d_diff_(id)_trace · axial · 5.0mm · 1.80mm/px · z∈[-90,+51]mm · 6 of 43 slices shown]
[im 1/43]
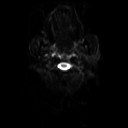
[im 9/43]
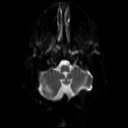
[im 17/43]
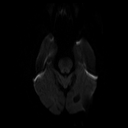
[im 26/43]
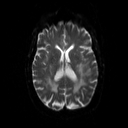
[im 34/43]
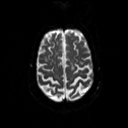
[im 43/43]
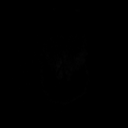

[Series 4: ep2d_diff_(id)_trace_adc · axial · 5.0mm · 1.80mm/px · z∈[-90,+51]mm · 2 of 22 slices shown]
[im 1/22]
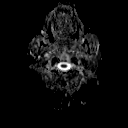
[im 22/22]
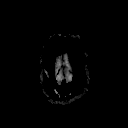

[Series 6: swi_images · axial · 2.0mm · 0.90mm/px · z∈[-98,+59]mm · 8 of 80 slices shown]
[im 1/80]
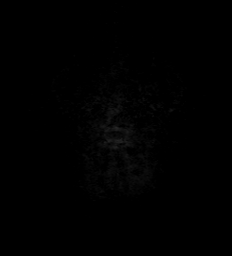
[im 10/80]
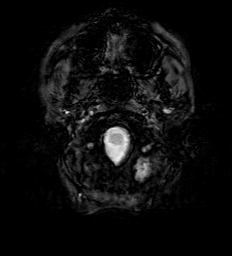
[im 20/80]
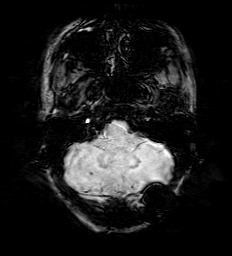
[im 30/80]
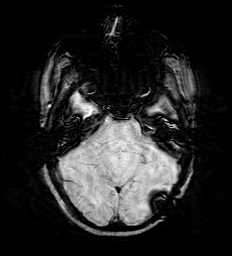
[im 50/80]
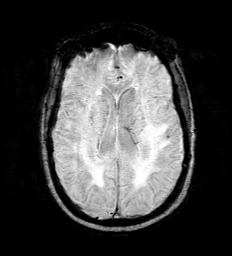
[im 60/80]
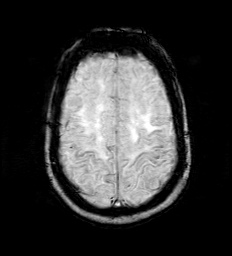
[im 70/80]
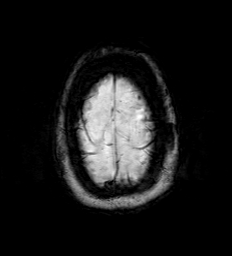
[im 80/80]
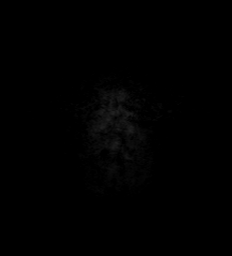

[Series 7: FLAIR · axial · 5.0mm · 0.45mm/px · z∈[-90,+51]mm · 2 of 22 slices shown]
[im 1/22]
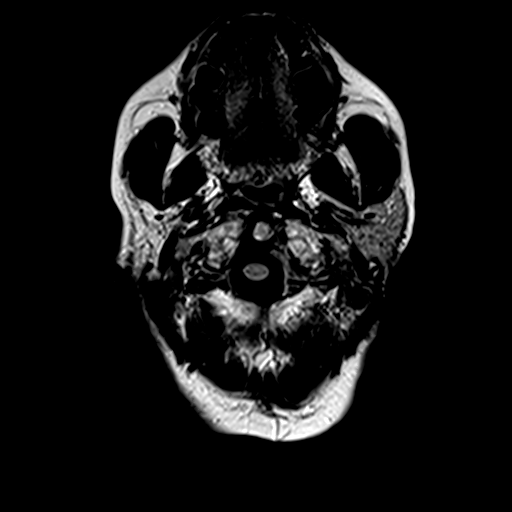
[im 22/22]
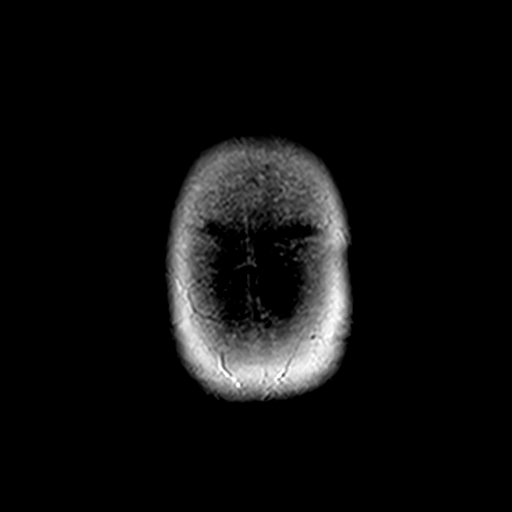

[Series 8: t2_tse_tra_512 · axial · 5.0mm · 0.60mm/px · z∈[-90,+51]mm · 2 of 22 slices shown]
[im 1/22]
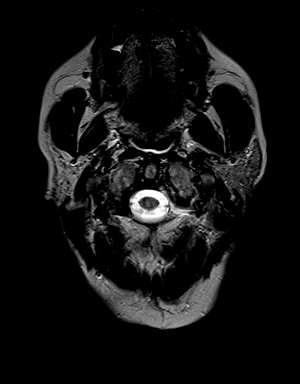
[im 22/22]
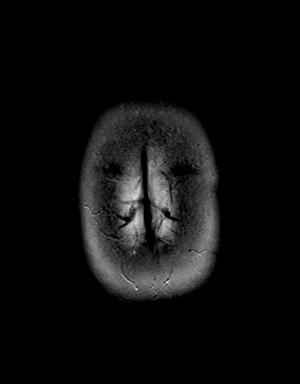

[Series 9: t1_mpr_tra · axial · 2.0mm · 0.45mm/px · z∈[-98,+59]mm · 8 of 80 slices shown]
[im 1/80]
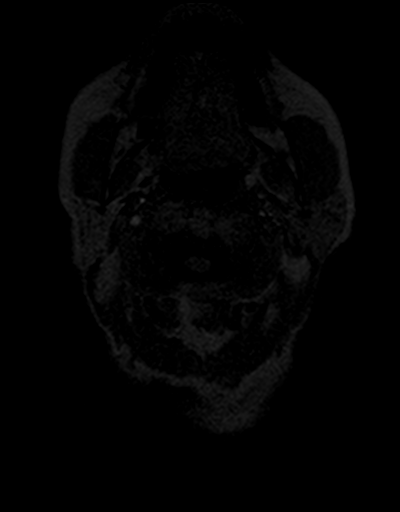
[im 10/80]
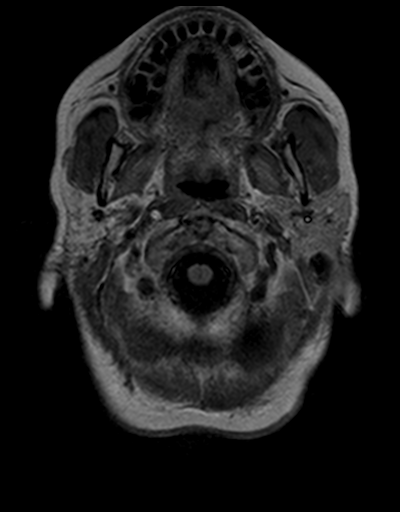
[im 20/80]
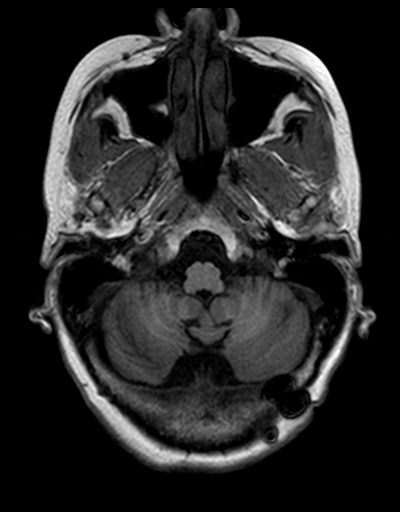
[im 30/80]
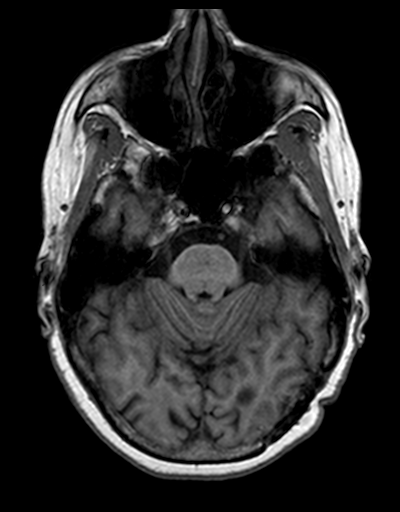
[im 50/80]
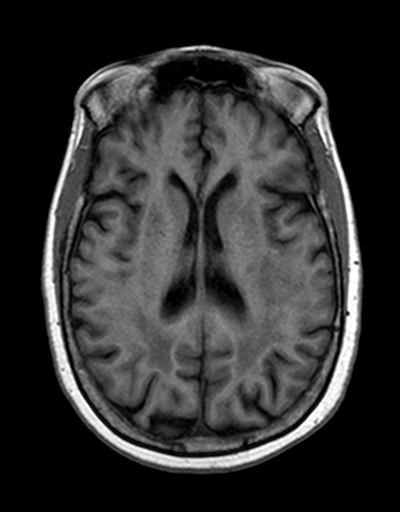
[im 60/80]
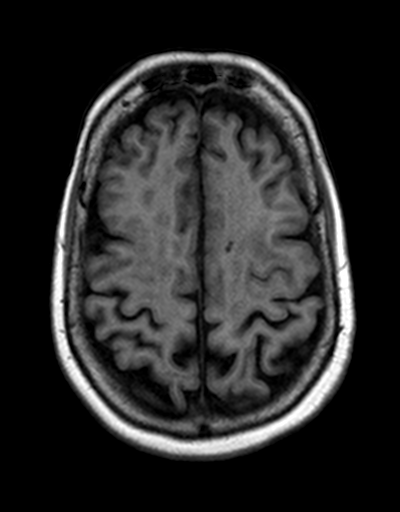
[im 70/80]
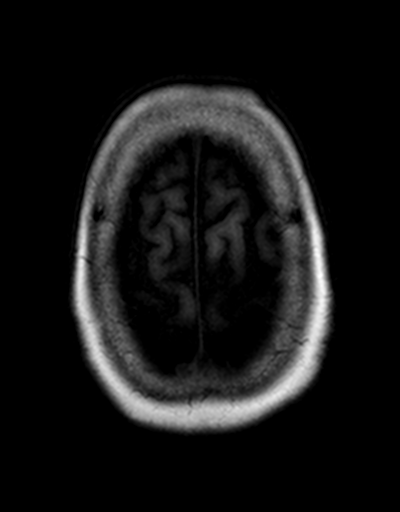
[im 80/80]
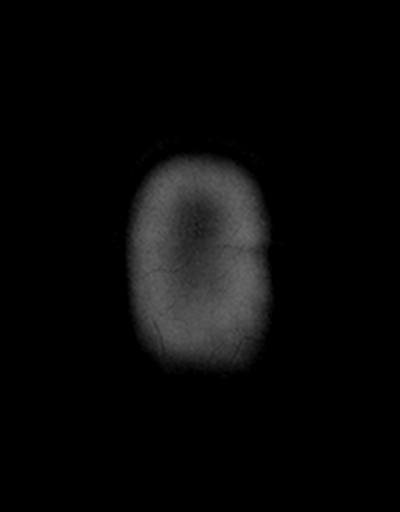

[Series 10: T2 · coronal · 5.0mm · 0.45mm/px · 2 of 22 slices shown]
[im 1/22]
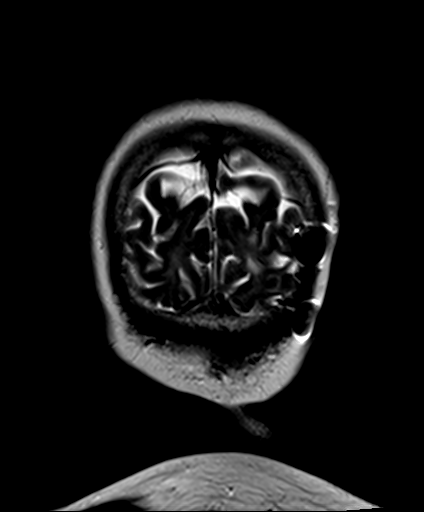
[im 22/22]
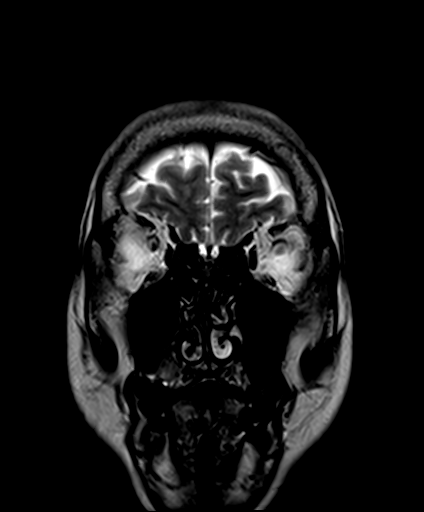

[Series 11: post cor · coronal · 5.0mm · 0.45mm/px · 1 of 22 slices shown]
[im 1/22]
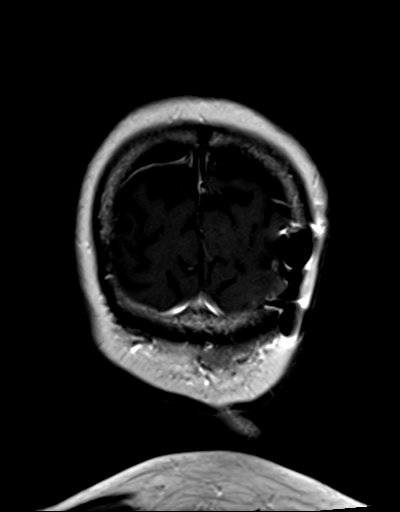

[34 of 48 positions shown; findings below may reference images not displayed]

FINDINGS: Status post left occipital craniotomy as previously seen with mild
underlying encephalomalacia within the left occipital lobe, mild ex
vacuo dilatation of the left occipital horn, unchanged. 5 mm bilobed
nodular enhancement along the posterior margin of the resection
cavity is unchanged. Similar confluent supratentorial white matter
T2 hyperintensities, without midline shift or mass effect.
Subcentimeter patchy right cerebellar T2 hyperintensities are
similar. The ventricles and sulci are normal for patient's age. No
reduced diffusion to suggest acute ischemia. Tiny focus of
susceptibility artifact in the right cerebellum now present. Similar
right temporal focus of susceptibility artifact, new within the left
posterior frontal lobe, with additional punctate foci of
susceptibility artifact which are favored to reflect vessel flow
voids. No new suspicious parenchymal enhancement, mass lesions nor
mass effect.

No abnormal extra-axial fluid collections, leptomeningeal
enhancement nor extra-axial masses. Normal major intracranial
vascular flow voids observed at the skull base.

Minimal secretions layering in the nasopharynx. Mild mucosal
thickening of ethmoid air cells without paranasal sinus air-fluid
levels. Mastoid air cells appear well-aerated. Severe
temporomandibular osteoarthrosis. Ocular globes and orbital contents
are nonsuspicious. No abnormal sellar expansion. Craniocervical
junction maintained.
IMPRESSION: Remote left occipital craniotomy, with similar appearance of the
subjacent resection cavity including 5 mm nodular enhancement
favored to reflect postoperative change. No new evidence of disease
progression/ new metastasis.

Similar severe white matter changes suggest post treatment related
changes, in a background of small vessel ischemic disease.

A few scattered micro hemorrhages, increased from prior examination
which may reflect sequelae of hypertension, and are unlikely to
reflect treated metastasis.

  By: Zeinab Tiger

## 2015-06-30 ENCOUNTER — Encounter: Payer: Self-pay | Admitting: Internal Medicine

## 2015-06-30 ENCOUNTER — Other Ambulatory Visit (HOSPITAL_BASED_OUTPATIENT_CLINIC_OR_DEPARTMENT_OTHER): Payer: Medicare Other

## 2015-06-30 ENCOUNTER — Ambulatory Visit (HOSPITAL_BASED_OUTPATIENT_CLINIC_OR_DEPARTMENT_OTHER): Payer: Medicare Other | Admitting: Internal Medicine

## 2015-06-30 VITALS — BP 118/58 | HR 74 | Temp 97.9°F | Resp 18 | Ht 67.0 in | Wt 192.6 lb

## 2015-06-30 DIAGNOSIS — C3432 Malignant neoplasm of lower lobe, left bronchus or lung: Secondary | ICD-10-CM

## 2015-06-30 DIAGNOSIS — C7931 Secondary malignant neoplasm of brain: Secondary | ICD-10-CM

## 2015-06-30 DIAGNOSIS — Z5111 Encounter for antineoplastic chemotherapy: Secondary | ICD-10-CM

## 2015-06-30 DIAGNOSIS — C3411 Malignant neoplasm of upper lobe, right bronchus or lung: Secondary | ICD-10-CM

## 2015-06-30 LAB — CBC WITH DIFFERENTIAL/PLATELET
BASO%: 0.2 % (ref 0.0–2.0)
Basophils Absolute: 0 10*3/uL (ref 0.0–0.1)
EOS%: 3.7 % (ref 0.0–7.0)
Eosinophils Absolute: 0.2 10*3/uL (ref 0.0–0.5)
HCT: 40.6 % (ref 34.8–46.6)
HGB: 13.1 g/dL (ref 11.6–15.9)
LYMPH%: 13 % — ABNORMAL LOW (ref 14.0–49.7)
MCH: 29.8 pg (ref 25.1–34.0)
MCHC: 32.3 g/dL (ref 31.5–36.0)
MCV: 92.5 fL (ref 79.5–101.0)
MONO#: 0.4 10*3/uL (ref 0.1–0.9)
MONO%: 8.5 % (ref 0.0–14.0)
NEUT#: 3.6 10*3/uL (ref 1.5–6.5)
NEUT%: 74.6 % (ref 38.4–76.8)
Platelets: 183 10*3/uL (ref 145–400)
RBC: 4.39 10*6/uL (ref 3.70–5.45)
RDW: 14.3 % (ref 11.2–14.5)
WBC: 4.8 10*3/uL (ref 3.9–10.3)
lymph#: 0.6 10*3/uL — ABNORMAL LOW (ref 0.9–3.3)

## 2015-06-30 LAB — COMPREHENSIVE METABOLIC PANEL (CC13)
ALT: 11 U/L (ref 0–55)
AST: 12 U/L (ref 5–34)
Albumin: 3.9 g/dL (ref 3.5–5.0)
Alkaline Phosphatase: 68 U/L (ref 40–150)
Anion Gap: 9 mEq/L (ref 3–11)
BUN: 13.6 mg/dL (ref 7.0–26.0)
CO2: 24 mEq/L (ref 22–29)
Calcium: 9.8 mg/dL (ref 8.4–10.4)
Chloride: 108 mEq/L (ref 98–109)
Creatinine: 0.9 mg/dL (ref 0.6–1.1)
EGFR: 80 mL/min/{1.73_m2} — ABNORMAL LOW (ref >90)
Glucose: 83 mg/dl (ref 70–140)
Potassium: 3.8 mEq/L (ref 3.5–5.1)
Sodium: 142 mEq/L (ref 136–145)
Total Bilirubin: 0.58 mg/dL (ref 0.20–1.20)
Total Protein: 6.6 g/dL (ref 6.4–8.3)

## 2015-06-30 NOTE — Progress Notes (Signed)
[place] [phone]   [phone]  OFFICE PROGRESS NOTE  [name], MD 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  BIOMARKERS TESTING ([facility]): Positive for: EGFR [id number] del, T790M, CTNNB1 D32G, SMAD4 T148f*10 Negative for: RET, ALK, BRAF, KRAS, ERBB2 and MET  PRIOR THERAPY:  1. Status post left lower lobectomy under the care of Dr BArlyce Dicein October 2002. 2. Status post course of concurrent chemoradiation with weekly carboplatin and paclitaxel under the care of Dr SBenay Spiceand Dr WElba Barmanin early 2003. 3. Status post [facility] 4. Status post left occipital craniotomy for tumor resection on September 21, 2005, under the care of Dr CChristella Noa 5. Status post whole brain irradiation under the care of Dr WElba Barman completed November 07, 2005. 6. Status post gamma knife [facility] the [facility] 7. Status post right thyroidectomy with radical neck dissection under the care of Dr. RConstance Holsteron 10/18/2011. 8. Status post excisional biopsy of right cervical lymph node that was consistent with metastatic adenocarcinoma. 9. Tarceva 150 mg by mouth daily with therapy beginning 06/03/2007, discontinued in July 2015 secondary to disease progression. Status post 79 cycles. 10. palliative radiotherapy to the enlarging right upper lobe lung nodule under the care of Dr. MValere Dross [facility]. [id number] 300 mg by mouth daily for 21 days discontinued secondary to study deviation and [facility] recent [facility]. 12. Open reduction and internal  fixation of trimalleolar ankle fracture dislocation with fixation of the fibula as well as medial          malleolus.   CURRENT THERAPY:  1) Tagrisso 80 mg by mouth daily, started 11/25/2014, status post 7 months of treatment.  INTERVAL HISTORY: Valerie [id number].[id number]. female returns to the clinic today for follow up visit. The patient is currently on treatment with Tagrisso 80 mg by mouth daily for the last 7 months and she is tolerating her treatment fairly well with no significant adverse effects. She denied having any significant skin rash or diarrhea. She is feeling fine with no specific complaints except for occasional dizzy spells. An MRI of the brain performed recently showed no evidence for progressive disease in the brain. She denied having any significant chest pain, shortness of breath, cough or hemoptysis. She denied having any significant nausea or vomiting. She had repeat CBC and complaints metabolic panel performed recently and she is here for evaluation and discussion of her lab and MRI results.  MEDICAL HISTORY: Past Medical History  Diagnosis Date  . Heart murmur   . Shortness of breath     hx lung ca   . Arthritis   . Anal fistula   . H/O osteoporosis   . H/O vitamin D deficiency   . History of measles, mumps, or rubella   . H/O varicella   . Hypertension   . Yeast infection   . Abnormal Pap smear 2006  . Atrophic vaginitis 2008  . Dyspareunia 2008  . Dementia 2009  . Osteoporosis 2010  . Status post chemotherapy 2003    CARBOPLATIN/PACLITAXEL /STATUS POST CLINICAL TRAIL OF  CELEBREX AND IRESSA AT [facility] FOR 1 YEAR  . Status post radiation therapy 2003    LEFT LUNG  . Status post radiation therapy 11/07/2005    WHOLE BRAIN: DR Larkin [facility]  . Status post radiation therapy 06/02/2008    GAMMA KNIFE OF RESECTED CAVITAY  . On antineoplastic chemotherapy     TARCEVA  . Nodule of right lung CT- 06/03/12    RIGHT UPPER LOBE  . Thyroid adenoma     ?  [name]  Anxiety   . Cataract   . Nodule of right lung 06/03/12    Upper Lobe  . History of radiation therapy 07/28/13- 08/10/13    right lung metastasis 5000 cGy 10 sessions  . Ankle fracture   . Lung cancer ([place]) dx'd 2002  . Metastasis to brain [facility]) dx'd 2008  . 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 Thyroid cancer ([place]) 10/18/11 bx    adenoid nodules   . Lung metastasis ([facility])     PET scan 05/05/13, RUL lung nodule  . Lung cancer ([place])   . Lung cancer ([place])     ALLERGIES:  has No Known Allergies.  MEDICATIONS:  Current Outpatient Prescriptions  Medication Sig Dispense Refill  . alendronate (FOSAMAX) 70 MG tablet Take 1 tablet (70 mg total) by mouth every 7 (seven) days. Saturdays; Take with a full glass of water on an empty stomach. 4 tablet 12  . cholecalciferol (VITAMIN D) 1000 UNITS tablet Take 1,000 Units by mouth at bedtime.    . donepezil (ARICEPT) 10 MG tablet Take 10 mg by mouth daily.  0  . loperamide (IMODIUM) 2 MG capsule Take 2 mg by mouth.    . Multiple Vitamin (MULTI-VITAMINS) TABS Take by mouth.    . research study medication Take home supply.    [name] TAGRISSO 80 MG tablet TAKE 1 TABLET BY MOUTH ONCE DAILY 30 tablet 2  . XARELTO 20 MG TABS tablet Take 1 tablet (20 mg total) by mouth daily. 30 tablet 0   No current facility-administered medications for this visit.    SURGICAL HISTORY:  Past Surgical History  Procedure Laterality Date  . Left occipital craniotomy  09/21/2005    tumor  . Left lower lobectomy  05/2001  . Radical neck dissection  10/18/2011    Procedure: RADICAL NECK DISSECTION;  Surgeon: Izora Gala, MD;  Location: [facility];  Service: ENT;  Laterality: Right;  RIGHT MODIFIED NECK DISSECTION /POSSIBLE RIGHT THYROIDECTOM  . Thyroidectomy  10/18/2011    Procedure: THYROIDECTOMY WITH RADICAL NECK DISSECTION;  Surgeon: Izora Gala, MD;  Location: [facility];  Service: ENT;  Laterality: Right;  . Lung lobectomy    . Neck surgery    . Brain surgery    . Orif  ankle fracture Left 10/02/2014    Procedure: OPEN REDUCTION INTERNAL FIXATION (ORIF) ANKLE FRACTURE;  Surgeon: Alta Corning, MD;  Location: [facility];  Service: Orthopedics;  Laterality: Left;    REVIEW OF SYSTEMS:  A comprehensive review of systems was negative except for: Constitutional: positive for fatigue Neurological: positive for dizziness   PHYSICAL EXAMINATION: General appearance: alert, cooperative, fatigued and no distress Head: Normocephalic, without obvious abnormality, atraumatic Neck: no adenopathy Lymph nodes: Cervical, supraclavicular, and axillary nodes normal. Resp: clear to auscultation bilaterally Cardio: regular rate and rhythm, S1, S2 normal, no murmur, click, rub or gallop GI: soft, non-tender; bowel sounds normal; no masses,  no organomegaly Extremities: extremities normal, atraumatic, no cyanosis or edema Neurologic: Alert and  oriented X 3, normal strength and tone. Normal symmetric reflexes. Normal coordination and gait  ECOG PERFORMANCE STATUS: 1 - Symptomatic but completely ambulatory  Blood pressure 118/58, pulse 74, temperature 97.9 F (36.6 C), temperature source Oral, resp. rate 18, height _0  (1.702 m), weight 192 lb 9.6 oz (87.363 kg), SpO2 99 %.  LABORATORY DATA: Lab Results  Component Value Date   WBC 4.8 06/30/2015   HGB 13.1 06/30/2015   HCT 40.6 06/30/2015   MCV 92.5 06/30/2015   PLT 183 06/30/2015      Chemistry      Component Value Date/Time   NA 142 06/30/2015 1120   NA 135 10/22/2014 0527   NA 138 02/28/2012 0939   K 3.8 06/30/2015 1120   K 3.5 10/22/2014 0527   K 4.6 02/28/2012 0939   CL 102 10/22/2014 0527   CL 106 12/09/2012 0806   CL 99 02/28/2012 0939   CO2 24 06/30/2015 1120   CO2 27 10/22/2014 0527   CO2 28 02/28/2012 0939   BUN 13.6 06/30/2015 1120   BUN 17 10/22/2014 0527   BUN 12 02/28/2012 0939   CREATININE 0.9 06/30/2015 1120   CREATININE 0.64 10/22/2014 0527   CREATININE 1.0 02/28/2012 0939      Component  Value Date/Time   CALCIUM 9.8 06/30/2015 1120   CALCIUM 8.1* 10/22/2014 0527   CALCIUM 9.1 02/28/2012 0939   ALKPHOS 68 06/30/2015 1120   ALKPHOS 72 10/21/2014 0610   ALKPHOS 80 02/28/2012 0939   AST 12 06/30/2015 1120   AST 22 10/21/2014 0610   AST 20 02/28/2012 0939   ALT 11 06/30/2015 1120   ALT 24 10/21/2014 0610   ALT 21 02/28/2012 0939   BILITOT 0.58 06/30/2015 1120   BILITOT 0.5 10/21/2014 0610   BILITOT 1.30 02/28/2012 0939       RADIOGRAPHIC STUDIES: Mr Jeri Cos Wo Contrast  2015/07/03  CLINICAL DATA:  Metastatic non-small cell lung cancer with personal history of brain metastases. EXAM: MRI HEAD WITHOUT AND WITH CONTRAST TECHNIQUE: Multiplanar, multiecho pulse sequences of the brain and surrounding structures were obtained without and with intravenous contrast. CONTRAST:  68m MULTIHANCE GADOBENATE DIMEGLUMINE 529 MG/ML IV SOLN COMPARISON:  MRI brain 07/01/2013. FINDINGS: A left occipital craniotomy in is again noted. A 6 mm focus of nodular enhancement subjacent to the craniotomy site is stable. No new focal enhancing lesions are evident to suggest metastatic disease of the brain or meninges. No acute infarct, hemorrhage, or mass lesion is present. The ventricles are of normal size. Diffuse periventricular and subcortical T2 changes have progressed since prior exam. No significant extra-axial fluid collection is present. The internal auditory canals are within normal limits. The brainstem is unremarkable. Flow is present in the major intracranial arteries. The globes and orbits are intact. The paranasal sinuses are clear. There is minimal fluid posteriorly in the right maxillary sinuses, a chronic finding. No obstructing nasopharyngeal lesion is present. There is some fluid in the nasopharynx. IMPRESSION: 1. Stable 6 mm enhancing nodule subjacent to the left occipital craniotomy site. 2. No new enhancing lesions to suggest metastatic disease to the brain or meninges. 3. Progressive  diffuse periventricular and subcortical T2 changes, likely reflecting the sequela of prior therapy. Electronically Signed   By: CSan MorelleM.D.   On: 12016/06/1320:14   ASSESSMENT AND PLAN: this is a very pleasant 69years old [place] female with metastatic non-small cell lung cancer adenocarcinoma, with positive EGFR mutation but also resistant mutation T790M.  She has been on treatment with oral Tarceva for more than 6 years.  The patient has EGFR resistant mutation T790M and was treated for the last 8 months with [id number] on a clinical trial at [facility] but this was discontinued secondary to inability to follow-up with the protocol requirement. She had no evidence for disease progression on that treatment. The patient is currently on Tagrisso 80 mg by mouth daily status post 7 months and tolerating it fairly well with no significant adverse effects.  The recent MRI of the brain showed no evidence for progression of the brain metastasis. I recommended for the patient to continue her current treatment with Tagrisso. I will see her back for follow-up visit in one month for reevaluation after repeating CBC and comprehensive metabolic panel. She was advised to call immediately if she has any concerning symptoms in the interval. The patient voices understanding of current disease status and treatment options and is in agreement with the current care plan.  All questions were answered. The patient knows to call the clinic with any problems, questions or concerns. We can certainly see the patient much sooner if necessary.  Disclaimer: This note was dictated with voice recognition software. Similar sounding words can inadvertently be transcribed and may not be corrected upon review.

## 2015-07-01 ENCOUNTER — Telehealth: Payer: Self-pay | Admitting: Internal Medicine

## 2015-07-01 NOTE — Telephone Encounter (Signed)
Spoke with relative re appointments for December.

## 2015-07-22 DIAGNOSIS — Z6828 Body mass index (BMI) 28.0-28.9, adult: Secondary | ICD-10-CM | POA: Diagnosis not present

## 2015-07-22 DIAGNOSIS — Z1231 Encounter for screening mammogram for malignant neoplasm of breast: Secondary | ICD-10-CM | POA: Diagnosis not present

## 2015-07-22 DIAGNOSIS — Z01419 Encounter for gynecological examination (general) (routine) without abnormal findings: Secondary | ICD-10-CM | POA: Diagnosis not present

## 2015-07-22 DIAGNOSIS — R350 Frequency of micturition: Secondary | ICD-10-CM | POA: Diagnosis not present

## 2015-07-22 DIAGNOSIS — C73 Malignant neoplasm of thyroid gland: Secondary | ICD-10-CM | POA: Diagnosis not present

## 2015-07-22 DIAGNOSIS — C3490 Malignant neoplasm of unspecified part of unspecified bronchus or lung: Secondary | ICD-10-CM | POA: Diagnosis not present

## 2015-07-22 DIAGNOSIS — M818 Other osteoporosis without current pathological fracture: Secondary | ICD-10-CM | POA: Diagnosis not present

## 2015-07-22 DIAGNOSIS — N390 Urinary tract infection, site not specified: Secondary | ICD-10-CM | POA: Diagnosis not present

## 2015-08-09 ENCOUNTER — Other Ambulatory Visit (HOSPITAL_BASED_OUTPATIENT_CLINIC_OR_DEPARTMENT_OTHER): Payer: Medicare Other

## 2015-08-09 ENCOUNTER — Telehealth: Payer: Self-pay | Admitting: Internal Medicine

## 2015-08-09 ENCOUNTER — Encounter: Payer: Self-pay | Admitting: Internal Medicine

## 2015-08-09 ENCOUNTER — Ambulatory Visit (HOSPITAL_BASED_OUTPATIENT_CLINIC_OR_DEPARTMENT_OTHER): Payer: Medicare Other | Admitting: Internal Medicine

## 2015-08-09 VITALS — BP 118/59 | HR 74 | Temp 97.8°F | Resp 17 | Ht 67.0 in | Wt 187.5 lb

## 2015-08-09 DIAGNOSIS — C3411 Malignant neoplasm of upper lobe, right bronchus or lung: Secondary | ICD-10-CM

## 2015-08-09 DIAGNOSIS — C7931 Secondary malignant neoplasm of brain: Secondary | ICD-10-CM | POA: Diagnosis not present

## 2015-08-09 DIAGNOSIS — Z5111 Encounter for antineoplastic chemotherapy: Secondary | ICD-10-CM

## 2015-08-09 LAB — CBC WITH DIFFERENTIAL/PLATELET
BASO%: 0.5 % (ref 0.0–2.0)
Basophils Absolute: 0 10*3/uL (ref 0.0–0.1)
EOS%: 2.6 % (ref 0.0–7.0)
Eosinophils Absolute: 0.1 10*3/uL (ref 0.0–0.5)
HCT: 41.3 % (ref 34.8–46.6)
HGB: 13.2 g/dL (ref 11.6–15.9)
LYMPH%: 16.9 % (ref 14.0–49.7)
MCH: 29.5 pg (ref 25.1–34.0)
MCHC: 31.9 g/dL (ref 31.5–36.0)
MCV: 92.4 fL (ref 79.5–101.0)
MONO#: 0.4 10*3/uL (ref 0.1–0.9)
MONO%: 9.5 % (ref 0.0–14.0)
NEUT#: 2.9 10*3/uL (ref 1.5–6.5)
NEUT%: 70.5 % (ref 38.4–76.8)
Platelets: 196 10*3/uL (ref 145–400)
RBC: 4.47 10*6/uL (ref 3.70–5.45)
RDW: 14.8 % — ABNORMAL HIGH (ref 11.2–14.5)
WBC: 4.1 10*3/uL (ref 3.9–10.3)
lymph#: 0.7 10*3/uL — ABNORMAL LOW (ref 0.9–3.3)

## 2015-08-09 LAB — COMPREHENSIVE METABOLIC PANEL
ALT: 14 U/L (ref 0–55)
AST: 14 U/L (ref 5–34)
Albumin: 4.2 g/dL (ref 3.5–5.0)
Alkaline Phosphatase: 59 U/L (ref 40–150)
Anion Gap: 8 mEq/L (ref 3–11)
BUN: 14.6 mg/dL (ref 7.0–26.0)
CO2: 24 mEq/L (ref 22–29)
Calcium: 9.6 mg/dL (ref 8.4–10.4)
Chloride: 107 mEq/L (ref 98–109)
Creatinine: 1.1 mg/dL (ref 0.6–1.1)
EGFR: 61 mL/min/{1.73_m2} — ABNORMAL LOW (ref >90)
Glucose: 85 mg/dl (ref 70–140)
Potassium: 4.2 mEq/L (ref 3.5–5.1)
Sodium: 138 mEq/L (ref 136–145)
Total Bilirubin: 0.66 mg/dL (ref 0.20–1.20)
Total Protein: 7.2 g/dL (ref 6.4–8.3)

## 2015-08-09 MED ORDER — UNABLE TO FIND: Status: DC

## 2015-08-09 NOTE — Telephone Encounter (Signed)
Scheduled appt with patient while he was here in person at [place] office.       [facility].

## 2015-08-09 NOTE — Addendum Note (Signed)
Addended by: Lucile Crater on: 08/09/2015 11:18 AM   Modules accepted: Orders

## 2015-08-09 NOTE — Progress Notes (Signed)
[place] [phone]   [phone]  OFFICE PROGRESS NOTE  [name], MD 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  BIOMARKERS TESTING ([facility]): Positive for: EGFR [id number] del, T790M, CTNNB1 D32G, SMAD4 T165f*10 Negative for: RET, ALK, BRAF, KRAS, ERBB2 and MET  PRIOR THERAPY:  1. Status post left lower lobectomy under the care of Dr BArlyce Dicein October 2002. 2. Status post course of concurrent chemoradiation with weekly carboplatin and paclitaxel under the care of Dr SBenay Spiceand Dr WElba Barmanin early 2003. 3. Status post [facility] 4. Status post left occipital craniotomy for tumor resection on September 21, 2005, under the care of Dr CChristella Noa 5. Status post whole brain irradiation under the care of Dr WElba Barman completed November 07, 2005. 6. Status post gamma knife treatment to the resected [facility]. Status post right thyroidectomy with radical neck dissection under the care of Dr. RConstance Holsteron 10/18/2011. 8. Status post excisional biopsy of right cervical lymph node that was consistent with metastatic adenocarcinoma. 9. Tarceva 150 mg by mouth daily with therapy beginning 06/03/2007, discontinued in July 2015 secondary to disease progression. Status post 79 cycles. 10. palliative radiotherapy to the enlarging right upper lobe lung nodule under the care of Dr. MValere Dross [facility]. [id number] 300 mg by mouth daily for 21 days discontinued secondary to study deviation and [facility]. 12. Open reduction and internal  fixation of trimalleolar ankle fracture dislocation with fixation of the fibula as well as medial          malleolus.   CURRENT THERAPY:  1) Tagrisso 80 mg by mouth daily, started 11/25/2014, status post 8 months of treatment.  INTERVAL HISTORY: JPATCHES MCDONNELL69y.o. female returns to the clinic today for follow up visit. The patient is currently on treatment with Tagrisso 80 mg by mouth daily for the last 8 months and she is tolerating her treatment fairly well with no significant adverse effects except for few episodes of diarrhea that has been going on before starting the treatment. She takes Imodium as needed. She denied having any significant skin rash. She is feeling fine with no specific complaints except for occasional dizzy spells. She denied having any significant chest pain, shortness of breath, cough or hemoptysis. She denied having any significant nausea or vomiting. She had repeat CBC and comprehensive metabolic panel performed earlier today and she is here for evaluation and discussion of her lab results.  MEDICAL HISTORY: Past Medical History  Diagnosis Date  . Heart murmur   . Shortness of breath     hx lung ca   . Arthritis   . Anal fistula   . H/O osteoporosis   . H/O vitamin D deficiency   . History of measles, mumps, or rubella   . H/O varicella   . Hypertension   . Yeast infection   . Abnormal Pap smear 2006  . Atrophic vaginitis 2008  . Dyspareunia 2008  . Dementia 2009  . Osteoporosis 2010  . Status post chemotherapy 2003    CARBOPLATIN/PACLITAXEL /STATUS POST CLINICAL TRAIL  OF CELEBREX AND IRESSA AT [facility] FOR 1 YEAR  . Status post radiation therapy 2003    LEFT LUNG  . Status post radiation therapy 11/07/2005    WHOLE BRAIN: DR Larkin [facility]  . Status post radiation therapy 06/02/2008    GAMMA KNIFE OF RESECTED CAVITAY  . On antineoplastic chemotherapy     TARCEVA  . Nodule of right lung CT- 06/03/12    RIGHT UPPER LOBE  . Thyroid adenoma     ?   [name] Anxiety   . Cataract   . Nodule of right lung 06/03/12    Upper Lobe  . History of radiation therapy 07/28/13- 08/10/13    right lung metastasis 5000 cGy 10 sessions  . Ankle fracture   . Lung cancer ([place]) dx'd 2002  . Metastasis to brain [facility]) dx'd 2008  . 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 Thyroid cancer ([place]) 10/18/11 bx    adenoid nodules   . Lung metastasis ([place])     PET scan 05/05/13, RUL lung nodule  . Lung cancer ([place])   . Lung cancer ([place])     ALLERGIES:  has No Known Allergies.  MEDICATIONS:  Current Outpatient Prescriptions  Medication Sig Dispense Refill  . alendronate (FOSAMAX) 70 MG tablet Take 1 tablet (70 mg total) by mouth every 7 (seven) days. Saturdays; Take with a full glass of water on an empty stomach. 4 tablet 12  . cholecalciferol (VITAMIN D) 1000 UNITS tablet Take 1,000 Units by mouth at bedtime.    . donepezil (ARICEPT) 10 MG tablet Take 10 mg by mouth daily.  0  . loperamide (IMODIUM) 2 MG capsule Take 2 mg by mouth.    . Multiple Vitamin (MULTI-VITAMINS) TABS Take by mouth.    . research study medication Take home supply.    . sulfamethoxazole-trimethoprim (BACTRIM DS,SEPTRA DS) 800-160 MG tablet     . TAGRISSO 80 MG tablet   2  . XARELTO 20 MG TABS tablet Take 1 tablet (20 mg total) by mouth daily. 30 tablet 0   No current facility-administered medications for this visit.    SURGICAL HISTORY:  Past Surgical History  Procedure Laterality Date  . Left occipital craniotomy  09/21/2005    tumor  . Left lower lobectomy  05/2001  . Radical neck dissection  10/18/2011    Procedure: RADICAL NECK DISSECTION;  Surgeon: Izora Gala, MD;  Location: [facility];  Service: ENT;  Laterality: Right;  RIGHT MODIFIED NECK DISSECTION /POSSIBLE RIGHT THYROIDECTOM  . Thyroidectomy  10/18/2011    Procedure: THYROIDECTOMY WITH RADICAL NECK DISSECTION;  Surgeon: Izora Gala, MD;  Location: [facility];  Service: ENT;  Laterality: Right;  . Lung lobectomy    . Neck  surgery    . Brain surgery    . Orif ankle fracture Left 10/02/2014    Procedure: OPEN REDUCTION INTERNAL FIXATION (ORIF) ANKLE FRACTURE;  Surgeon: Alta Corning, MD;  Location: [facility];  Service: Orthopedics;  Laterality: Left;    REVIEW OF SYSTEMS:  A comprehensive review of systems was negative except for: Constitutional: positive for fatigue Gastrointestinal: positive for diarrhea   PHYSICAL EXAMINATION: General appearance: alert, cooperative, fatigued and no distress Head: Normocephalic, without obvious abnormality, atraumatic Neck: no adenopathy Lymph nodes: Cervical, supraclavicular, and axillary nodes normal. Resp: clear to auscultation bilaterally Cardio: regular rate and rhythm, S1, S2 normal, no murmur, click, rub or gallop GI: soft, non-tender; bowel sounds normal; no masses,  no organomegaly Extremities: extremities normal, atraumatic, no  cyanosis or edema Neurologic: Alert and oriented X 3, normal strength and tone. Normal symmetric reflexes. Normal coordination and gait  ECOG PERFORMANCE STATUS: 1 - Symptomatic but completely ambulatory  Blood pressure 118/59, pulse 74, temperature 97.8 F (36.6 C), temperature source Oral, resp. rate 17, height 5' 7" (1.702 m), weight 187 lb 8 oz (85.049 kg), SpO2 100 %.  LABORATORY DATA: Lab Results  Component Value Date   WBC 4.1 08/09/2015   HGB 13.2 08/09/2015   HCT 41.3 08/09/2015   MCV 92.4 08/09/2015   PLT 196 08/09/2015      Chemistry      Component Value Date/Time   NA 138 08/09/2015 1010   NA 135 10/22/2014 0527   NA 138 02/28/2012 0939   K 4.2 08/09/2015 1010   K 3.5 10/22/2014 0527   K 4.6 02/28/2012 0939   CL 102 10/22/2014 0527   CL 106 12/09/2012 0806   CL 99 02/28/2012 0939   CO2 24 08/09/2015 1010   CO2 27 10/22/2014 0527   CO2 28 02/28/2012 0939   BUN 14.6 08/09/2015 1010   BUN 17 10/22/2014 0527   BUN 12 02/28/2012 0939   CREATININE 1.1 08/09/2015 1010   CREATININE 0.64 10/22/2014 0527    CREATININE 1.0 02/28/2012 0939      Component Value Date/Time   CALCIUM 9.6 08/09/2015 1010   CALCIUM 8.1* 10/22/2014 0527   CALCIUM 9.1 02/28/2012 0939   ALKPHOS 59 08/09/2015 1010   ALKPHOS 72 10/21/2014 0610   ALKPHOS 80 02/28/2012 0939   AST 14 08/09/2015 1010   AST 22 10/21/2014 0610   AST 20 02/28/2012 0939   ALT 14 08/09/2015 1010   ALT 24 10/21/2014 0610   ALT 21 02/28/2012 0939   BILITOT 0.66 08/09/2015 1010   BILITOT 0.5 10/21/2014 0610   BILITOT 1.30 02/28/2012 0939       RADIOGRAPHIC STUDIES: No results found. ASSESSMENT AND PLAN: this is a very pleasant 69 years old African American female with metastatic non-small cell lung cancer adenocarcinoma, with positive EGFR mutation but also resistant mutation T790M. She has been on treatment with oral Tarceva for more than 6 years.  The patient has EGFR resistant mutation T790M and was treated for the last 8 months with [id number] on a clinical trial at [facility] but this was discontinued secondary to inability to follow-up with the protocol requirement. She had no evidence for disease progression on that treatment. The patient is currently on Tagrisso 80 mg by mouth daily status post 8 months and tolerating it fairly well with no significant adverse effects.  I recommended for the patient to continue her current treatment with Tagrisso. I will see her back for follow-up visit in one month for reevaluation after repeating CBC and comprehensive metabolic panel as well as repeat CT scan of the chest, abdomen and pelvis for restaging of her disease. The patient was given prescription for cranial prosthesis today. She was advised to call immediately if she has any concerning symptoms in the interval. The patient voices understanding of current disease status and treatment options and is in agreement with the current care plan.  All questions were answered. The patient knows to call the clinic with any problems, questions or  concerns. We can certainly see the patient much sooner if necessary.  Disclaimer: This note was dictated with voice recognition software. Similar sounding words can inadvertently be transcribed and may not be corrected upon review.

## 2015-08-17 DIAGNOSIS — N390 Urinary tract infection, site not specified: Secondary | ICD-10-CM | POA: Diagnosis not present

## 2015-08-19 ENCOUNTER — Telehealth: Payer: Self-pay | Admitting: Internal Medicine

## 2015-08-19 NOTE — Telephone Encounter (Signed)
January appointments completed by AR. Due to ct scheduled for 1/16 lab moved from 1/17 to 1/16 - f/u remains 1/24. Spoke with patient she is aware. New schedule mailed.

## 2015-08-24 ENCOUNTER — Emergency Department (HOSPITAL_COMMUNITY): Payer: Medicare Other

## 2015-08-24 ENCOUNTER — Inpatient Hospital Stay (HOSPITAL_COMMUNITY)
Admission: EM | Admit: 2015-08-24 | Discharge: 2015-08-30 | DRG: 871 | Disposition: A | Discharge status: Home Health | Payer: Medicare Other | Attending: Internal Medicine | Admitting: Internal Medicine

## 2015-08-24 ENCOUNTER — Encounter (HOSPITAL_COMMUNITY): Payer: Self-pay | Admitting: Nurse Practitioner

## 2015-08-24 DIAGNOSIS — R0902 Hypoxemia: Secondary | ICD-10-CM | POA: Diagnosis present

## 2015-08-24 DIAGNOSIS — C779 Secondary and unspecified malignant neoplasm of lymph node, unspecified: Secondary | ICD-10-CM | POA: Diagnosis present

## 2015-08-24 DIAGNOSIS — R0602 Shortness of breath: Secondary | ICD-10-CM | POA: Diagnosis not present

## 2015-08-24 DIAGNOSIS — F0390 Unspecified dementia without behavioral disturbance: Secondary | ICD-10-CM | POA: Diagnosis not present

## 2015-08-24 DIAGNOSIS — R6510 Systemic inflammatory response syndrome (SIRS) of non-infectious origin without acute organ dysfunction: Secondary | ICD-10-CM | POA: Diagnosis present

## 2015-08-24 DIAGNOSIS — Z8249 Family history of ischemic heart disease and other diseases of the circulatory system: Secondary | ICD-10-CM

## 2015-08-24 DIAGNOSIS — Z923 Personal history of irradiation: Secondary | ICD-10-CM

## 2015-08-24 DIAGNOSIS — K521 Toxic gastroenteritis and colitis: Secondary | ICD-10-CM | POA: Diagnosis present

## 2015-08-24 DIAGNOSIS — I10 Essential (primary) hypertension: Secondary | ICD-10-CM | POA: Diagnosis present

## 2015-08-24 DIAGNOSIS — Z8619 Personal history of other infectious and parasitic diseases: Secondary | ICD-10-CM

## 2015-08-24 DIAGNOSIS — C3490 Malignant neoplasm of unspecified part of unspecified bronchus or lung: Secondary | ICD-10-CM | POA: Diagnosis not present

## 2015-08-24 DIAGNOSIS — Z808 Family history of malignant neoplasm of other organs or systems: Secondary | ICD-10-CM | POA: Diagnosis not present

## 2015-08-24 DIAGNOSIS — Z7901 Long term (current) use of anticoagulants: Secondary | ICD-10-CM | POA: Diagnosis not present

## 2015-08-24 DIAGNOSIS — C7931 Secondary malignant neoplasm of brain: Secondary | ICD-10-CM | POA: Diagnosis present

## 2015-08-24 DIAGNOSIS — R05 Cough: Secondary | ICD-10-CM | POA: Diagnosis not present

## 2015-08-24 DIAGNOSIS — Z800 Family history of malignant neoplasm of digestive organs: Secondary | ICD-10-CM

## 2015-08-24 DIAGNOSIS — J189 Pneumonia, unspecified organism: Secondary | ICD-10-CM | POA: Diagnosis present

## 2015-08-24 DIAGNOSIS — M810 Age-related osteoporosis without current pathological fracture: Secondary | ICD-10-CM | POA: Diagnosis present

## 2015-08-24 DIAGNOSIS — C3492 Malignant neoplasm of unspecified part of left bronchus or lung: Secondary | ICD-10-CM | POA: Diagnosis present

## 2015-08-24 DIAGNOSIS — R509 Fever, unspecified: Secondary | ICD-10-CM

## 2015-08-24 DIAGNOSIS — R059 Cough, unspecified: Secondary | ICD-10-CM | POA: Diagnosis present

## 2015-08-24 DIAGNOSIS — R069 Unspecified abnormalities of breathing: Secondary | ICD-10-CM | POA: Diagnosis not present

## 2015-08-24 DIAGNOSIS — A419 Sepsis, unspecified organism: Principal | ICD-10-CM | POA: Diagnosis present

## 2015-08-24 DIAGNOSIS — M1990 Unspecified osteoarthritis, unspecified site: Secondary | ICD-10-CM | POA: Diagnosis present

## 2015-08-24 DIAGNOSIS — J101 Influenza due to other identified influenza virus with other respiratory manifestations: Secondary | ICD-10-CM | POA: Diagnosis present

## 2015-08-24 DIAGNOSIS — T451X5A Adverse effect of antineoplastic and immunosuppressive drugs, initial encounter: Secondary | ICD-10-CM | POA: Diagnosis present

## 2015-08-24 DIAGNOSIS — C719 Malignant neoplasm of brain, unspecified: Secondary | ICD-10-CM

## 2015-08-24 DIAGNOSIS — J09X1 Influenza due to identified novel influenza A virus with pneumonia: Secondary | ICD-10-CM | POA: Diagnosis present

## 2015-08-24 DIAGNOSIS — K529 Noninfective gastroenteritis and colitis, unspecified: Secondary | ICD-10-CM | POA: Diagnosis present

## 2015-08-24 DIAGNOSIS — Y95 Nosocomial condition: Secondary | ICD-10-CM | POA: Diagnosis present

## 2015-08-24 DIAGNOSIS — Z9221 Personal history of antineoplastic chemotherapy: Secondary | ICD-10-CM | POA: Diagnosis not present

## 2015-08-24 DIAGNOSIS — Z85850 Personal history of malignant neoplasm of thyroid: Secondary | ICD-10-CM

## 2015-08-24 DIAGNOSIS — J90 Pleural effusion, not elsewhere classified: Secondary | ICD-10-CM | POA: Diagnosis not present

## 2015-08-24 DIAGNOSIS — Z79899 Other long term (current) drug therapy: Secondary | ICD-10-CM | POA: Diagnosis not present

## 2015-08-24 DIAGNOSIS — Z902 Acquired absence of lung [part of]: Secondary | ICD-10-CM

## 2015-08-24 LAB — BRAIN NATRIURETIC PEPTIDE: B Natriuretic Peptide: 254.3 pg/mL — ABNORMAL HIGH (ref 0.0–100.0)

## 2015-08-24 LAB — CBC WITH DIFFERENTIAL/PLATELET
Basophils Absolute: 0 10*3/uL (ref 0.0–0.1)
Basophils Relative: 0 %
Eosinophils Absolute: 0 10*3/uL (ref 0.0–0.7)
Eosinophils Relative: 0 %
HCT: 38.6 % (ref 36.0–46.0)
Hemoglobin: 12.2 g/dL (ref 12.0–15.0)
Lymphocytes Relative: 3 %
Lymphs Abs: 0.2 10*3/uL — ABNORMAL LOW (ref 0.7–4.0)
MCH: 29.3 pg (ref 26.0–34.0)
MCHC: 31.6 g/dL (ref 30.0–36.0)
MCV: 92.8 fL (ref 78.0–100.0)
Monocytes Absolute: 0.4 10*3/uL (ref 0.1–1.0)
Monocytes Relative: 5 %
Neutro Abs: 6.9 10*3/uL (ref 1.7–7.7)
Neutrophils Relative %: 92 %
Platelets: 171 10*3/uL (ref 150–400)
RBC: 4.16 MIL/uL (ref 3.87–5.11)
RDW: 13.6 % (ref 11.5–15.5)
WBC: 7.5 10*3/uL (ref 4.0–10.5)

## 2015-08-24 LAB — COMPREHENSIVE METABOLIC PANEL
ALT: 16 U/L (ref 14–54)
AST: 19 U/L (ref 15–41)
Albumin: 4.5 g/dL (ref 3.5–5.0)
Alkaline Phosphatase: 58 U/L (ref 38–126)
Anion gap: 10 (ref 5–15)
BUN: 15 mg/dL (ref 6–20)
CO2: 26 mmol/L (ref 22–32)
Calcium: 9.5 mg/dL (ref 8.9–10.3)
Chloride: 103 mmol/L (ref 101–111)
Creatinine, Ser: 0.8 mg/dL (ref 0.44–1.00)
GFR calc Af Amer: >60 mL/min (ref >60)
GFR calc non Af Amer: >60 mL/min (ref >60)
Glucose, Bld: 148 mg/dL — ABNORMAL HIGH (ref 65–99)
Potassium: 3.9 mmol/L (ref 3.5–5.1)
Sodium: 139 mmol/L (ref 135–145)
Total Bilirubin: 0.7 mg/dL (ref 0.3–1.2)
Total Protein: 7.5 g/dL (ref 6.5–8.1)

## 2015-08-24 LAB — I-STAT CG4 LACTIC ACID, ED: Lactic Acid, Venous: 1.54 mmol/L (ref 0.5–2.0)

## 2015-08-24 LAB — PROTIME-INR
INR: 1.26 (ref 0.00–1.49)
Prothrombin Time: 16 seconds — ABNORMAL HIGH (ref 11.6–15.2)

## 2015-08-24 LAB — APTT: aPTT: 35 seconds (ref 24–37)

## 2015-08-24 LAB — LACTIC ACID, PLASMA
Lactic Acid, Venous: 1.3 mmol/L (ref 0.5–2.0)
Lactic Acid, Venous: 1.3 mmol/L (ref 0.5–2.0)

## 2015-08-24 LAB — TROPONIN I: Troponin I: <0.03 ng/mL (ref <0.031)

## 2015-08-24 LAB — PROCALCITONIN: Procalcitonin: 0.42 ng/mL

## 2015-08-24 MED ORDER — IPRATROPIUM-ALBUTEROL 0.5-2.5 (3) MG/3ML IN SOLN
3.0000 mL | Freq: Once | RESPIRATORY_TRACT | Status: AC
  Administered 2015-08-24: 3 mL via RESPIRATORY_TRACT
  Filled 2015-08-24: qty 3

## 2015-08-24 MED ORDER — SODIUM CHLORIDE 0.9 % IV SOLN
INTRAVENOUS | Status: DC
  Administered 2015-08-24: 20:00:00 via INTRAVENOUS

## 2015-08-24 MED ORDER — VANCOMYCIN HCL IN DEXTROSE 1-5 GM/200ML-% IV SOLN
1000.0000 mg | Freq: Once | INTRAVENOUS | Status: AC
  Administered 2015-08-24: 1000 mg via INTRAVENOUS
  Filled 2015-08-24: qty 200

## 2015-08-24 MED ORDER — PIPERACILLIN-TAZOBACTAM 3.375 G IVPB 30 MIN
3.3750 g | Freq: Once | INTRAVENOUS | Status: AC
  Administered 2015-08-24: 3.375 g via INTRAVENOUS
  Filled 2015-08-24: qty 50

## 2015-08-24 MED ORDER — IOHEXOL 300 MG/ML  SOLN
100.0000 mL | Freq: Once | INTRAMUSCULAR | Status: AC | PRN
  Administered 2015-08-24: 100 mL via INTRAVENOUS

## 2015-08-24 MED ORDER — ACETAMINOPHEN 500 MG PO TABS
1000.0000 mg | ORAL_TABLET | Freq: Once | ORAL | Status: AC
  Administered 2015-08-24: 1000 mg via ORAL
  Filled 2015-08-24: qty 2

## 2015-08-24 MED ORDER — VANCOMYCIN HCL IN DEXTROSE 1-5 GM/200ML-% IV SOLN
1000.0000 mg | Freq: Two times a day (BID) | INTRAVENOUS | Status: DC
  Administered 2015-08-25: 1000 mg via INTRAVENOUS
  Filled 2015-08-24: qty 200

## 2015-08-24 MED ORDER — IOHEXOL 300 MG/ML  SOLN
25.0000 mL | Freq: Once | INTRAMUSCULAR | Status: AC | PRN
  Administered 2015-08-24: 25 mL via ORAL

## 2015-08-24 MED ORDER — PIPERACILLIN-TAZOBACTAM 3.375 G IVPB
3.3750 g | Freq: Three times a day (TID) | INTRAVENOUS | Status: DC
  Administered 2015-08-25 – 2015-08-27 (×8): 3.375 g via INTRAVENOUS
  Filled 2015-08-24 (×8): qty 50

## 2015-08-24 NOTE — ED Notes (Signed)
Nurse drawing labs. 

## 2015-08-24 NOTE — H&P (Addendum)
[facility] Admission History and Physical       [name]:[id number] DOB: [birth year] DOA: 08/24/2015  Referring physician: EDP PCP: [name], MD  Specialists:   Chief Complaint: SOB and Cough and Fever  HPI: Valerie [name] is a 70 y.o. female with a history of Brain Cancer dx in 2007 S/P Radiation Rx and on Experimental Chemo RX who presents to the ED with complaints of Increased SOB and Cough and chest congestion x 3 days.  She had a fever to 102 today.   When EMS arrived to her home she was hypoxic and required 3 liters of NCO2.   She reports that her symptoms were getting worse over the past 24 hours.     Review of Systems:  Constitutional: No Weight Loss, No Weight Gain, Night Sweats, +Fevers, +Chills, Dizziness, Light Headedness, Fatigue, +Generalized Weakness HEENT: No Headaches, Difficulty Swallowing,Tooth/Dental Problems,Sore Throat,  No Sneezing, Rhinitis, Ear Ache, Nasal Congestion, or Post Nasal Drip,  Cardio-vascular:  No Chest pain, Orthopnea, PND, Edema in Lower Extremities, Anasarca, Dizziness, Palpitations  Resp: +Dyspnea, No DOE, No +Productive Cough, No Non-Productive Cough, No Hemoptysis, No Wheezing.    GI: No Heartburn, Indigestion, Abdominal Pain, Nausea, Vomiting, Diarrhea, Constipation, Hematemesis, Hematochezia, Melena, Change in Bowel Habits,  +Loss of Appetite  GU: No Dysuria, No Change in Color of Urine, No Urgency or Urinary Frequency, No Flank pain.  Musculoskeletal: No Joint Pain or Swelling, No Decreased Range of Motion, No Back Pain.  Neurologic: No Syncope, No Seizures, Muscle Weakness, Paresthesia, Vision Disturbance or Loss, No Diplopia, No Vertigo, No Difficulty Walking,  Skin: No Rash or Lesions. Psych: No Change in Mood or Affect, No Depression or Anxiety, No Memory loss, No Confusion, or Hallucinations   Past Medical History  Diagnosis Date  . Heart murmur   . Shortness of breath     hx lung ca   . Arthritis    . Anal fistula   . H/O osteoporosis   . H/O vitamin D deficiency   . History of measles, mumps, or rubella   . H/O varicella   . Hypertension   . Yeast infection   . Abnormal Pap smear 2006  . Atrophic vaginitis 2008  . Dyspareunia 2008  . Dementia 2009  . Osteoporosis 2010  . Status post chemotherapy 2003    CARBOPLATIN/PACLITAXEL /STATUS POST CLINICAL TRAIL OF CELEBREX AND IRESSA AT [facility] FOR 1 YEAR  . Status post radiation therapy 2003    LEFT LUNG  . Status post radiation therapy 11/07/2005    WHOLE BRAIN: DR Larkin [facility]  . Status post radiation therapy 06/02/2008    GAMMA KNIFE OF RESECTED CAVITAY  . On antineoplastic chemotherapy     TARCEVA  . Nodule of right lung CT- 06/03/12    RIGHT UPPER LOBE  . Thyroid adenoma     ?  [name] Anxiety   . Cataract   . Nodule of right lung 06/03/12    Upper Lobe  . History of radiation therapy 07/28/13- 08/10/13    right lung metastasis 5000 cGy 10 sessions  . Ankle fracture   . Lung cancer ([place]) dx'd 2002  . Metastasis to brain [facility]) dx'd 2008  . 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 Thyroid cancer ([place]) 10/18/11 bx    adenoid nodules   . Lung metastasis ([place])     PET scan 05/05/13, RUL lung nodule  . Lung cancer ([place])   . Lung cancer ([place])   .  Primary cancer of right upper lobe of lung ([place]) 04/22/2009    Qualifier: Diagnosis of  By: Nils Pyle CMA Deborra Medina), Mearl Latin       Past Surgical History  Procedure Laterality Date  . Left occipital craniotomy  09/21/2005    tumor  . Left lower lobectomy  05/2001  . Radical neck dissection  10/18/2011    Procedure: RADICAL NECK DISSECTION;  Surgeon: Izora Gala, MD;  Location: [facility];  Service: ENT;  Laterality: Right;  RIGHT MODIFIED NECK DISSECTION /POSSIBLE RIGHT THYROIDECTOM  . Thyroidectomy  10/18/2011    Procedure: THYROIDECTOMY WITH RADICAL NECK DISSECTION;  Surgeon: Izora Gala, MD;  Location: [facility];  Service: ENT;  Laterality: Right;  . Lung lobectomy    . Neck surgery    . Brain  surgery    . Orif ankle fracture Left 10/02/2014    Procedure: OPEN REDUCTION INTERNAL FIXATION (ORIF) ANKLE FRACTURE;  Surgeon: Alta Corning, MD;  Location: [facility];  Service: Orthopedics;  Laterality: Left;      Prior to Admission medications   Medication Sig Start Date End Date Taking? Authorizing Provider  alendronate (FOSAMAX) 70 MG tablet Take 1 tablet (70 mg total) by mouth every 7 (seven) days. Saturdays; Take with a full glass of water on an empty stomach. 04/09/12  Yes Eldred Manges, MD  Ascorbic Acid (VITAMIN C) 100 MG tablet Take 100 mg by mouth daily.   Yes Historical Provider, MD  cholecalciferol (VITAMIN D) 1000 UNITS tablet Take 1,000 Units by mouth at bedtime.   Yes Historical Provider, MD  donepezil (ARICEPT) 10 MG tablet Take 10 mg by mouth daily. 12/31/14  Yes Historical Provider, MD  loperamide (IMODIUM) 2 MG capsule Take 2 mg by mouth as needed for diarrhea or loose stools.    Yes Historical Provider, MD  Multiple Vitamin (MULTI-VITAMINS) TABS Take 1 tablet by mouth daily.    Yes Historical Provider, MD  naproxen sodium (ANAPROX) 220 MG tablet Take 220 mg by mouth 2 (two) times daily as needed (pain).   Yes Historical Provider, MD  research study medication Take home supply. Patient taking differently: Take 80 mg by mouth at bedtime. Take home supply. 10/05/14  Yes Christina P Rama, MD  XARELTO 20 MG TABS tablet Take 1 tablet (20 mg total) by mouth daily. 01/03/15  Yes Curt Bears, MD  UNABLE TO FIND Med Name: Wig Dx primary Cancer of right upper lobe of lung Patient not taking: Reported on 08/24/2015 08/09/15   Curt Bears, MD     No Known Allergies  Social History:  reports that she has never smoked. She has never used smokeless tobacco. She reports that she does not drink alcohol or use illicit drugs.    Family History  Problem Relation Age of Onset  . Gait disorder Mother   . Cancer Maternal Aunt     menigeoma  . Cancer Mother     Meningioma  . Cancer  Father     Pancreatic  . Heart failure Sister        Physical Exam:  GEN:  Pleasant Elderly  70 y.o. African American female examined and in no acute distress; cooperative with exam Filed Vitals:   08/24/15 1524 08/24/15 1910  BP: 130/60   Pulse: 97   Temp: 98.6 F (37 C) 102.5 F (39.2 C)  TempSrc: Oral Rectal  Resp: 18   SpO2: 92%    Blood pressure 130/60, pulse 97, temperature 102.5 F (39.2 C), temperature source Rectal, resp.  rate 18, SpO2 92 %. PSYCH: She is alert and oriented x4; does not appear anxious does not appear depressed; affect is normal HEENT: Normocephalic and Atraumatic, Mucous membranes pink; PERRLA; EOM intact; Fundi:  Benign;  No scleral icterus, Nares: Patent, Oropharynx: Clear,  Fair Dentition,    Neck:  FROM, No Cervical Lymphadenopathy nor Thyromegaly or Carotid Bruit; No JVD; Breasts:: Not examined CHEST WALL: No tenderness CHEST: Normal respiration, clear to auscultation bilaterally HEART: Regular rate and rhythm; no murmurs rubs or gallops BACK: No kyphosis or scoliosis; No CVA tenderness ABDOMEN: Positive Bowel Sounds,  Obese, Soft Non-Tender, No Rebound or Guarding; No Masses, No Organomegaly. Rectal Exam: Not done EXTREMITIES: No Cyanosis, Clubbing, or Edema; No Ulcerations. Genitalia: not examined PULSES: 2+ and symmetric SKIN: Normal hydration no rash or ulceration CNS:  Alert and Oriented x 4, No Focal Deficits Vascular: pulses palpable throughout    Labs on Admission:  Basic Metabolic Panel:  Recent Labs Lab 08/24/15 1641  NA 139  K 3.9  CL 103  CO2 26  GLUCOSE 148*  BUN 15  CREATININE 0.80  CALCIUM 9.5   Liver Function Tests:  Recent Labs Lab 08/24/15 1641  AST 19  ALT 16  ALKPHOS 58  BILITOT 0.7  PROT 7.5  ALBUMIN 4.5   No results for input(s): LIPASE, AMYLASE in the last 168 hours. No results for input(s): AMMONIA in the last 168 hours. CBC:  Recent Labs Lab 08/24/15 1641  WBC 7.5  NEUTROABS 6.9  HGB  12.2  HCT 38.6  MCV 92.8  PLT 171   Cardiac Enzymes:  Recent Labs Lab 08/24/15 1641  TROPONINI <0.03    BNP (last 3 results) No results for input(s): BNP in the last 8760 hours.  ProBNP (last 3 results) No results for input(s): PROBNP in the last 8760 hours.  CBG: No results for input(s): GLUCAP in the last 168 hours.  Radiological Exams on Admission: Dg Chest 2 View  08/24/2015  CLINICAL DATA:  Shortness of breath, productive cough. EXAM: CHEST  2 VIEW COMPARISON:  September 30, 2014. FINDINGS: Stable cardiomegaly is noted. Postsurgical changes are again identified in the left hilum. No pneumothorax is noted. There is again noted elevated left hemidiaphragm posteriorly. Patient is rotated to the left. No acute pulmonary disease is noted. No definite pleural effusion is noted. IMPRESSION: Stable postsurgical changes as described above. No acute abnormality seen. Electronically Signed   By: Marijo Conception, M.D.   On: 08/24/2015 17:00   Ct Chest W Contrast  08/24/2015  CLINICAL DATA:  Shortness of breath for 2 days with productive cough. Lung cancer patient currently on chemotherapy. EXAM: CT CHEST, ABDOMEN, AND PELVIS WITH CONTRAST TECHNIQUE: Multidetector CT imaging of the chest, abdomen and pelvis was performed following the standard protocol during bolus administration of intravenous contrast. CONTRAST:  68m OMNIPAQUE IOHEXOL 300 MG/ML SOLN, 1068mOMNIPAQUE IOHEXOL 300 MG/ML SOLN COMPARISON:  Chest radiographs earlier this day. Chest abdomen pelvis CT 05/25/2015 FINDINGS: CT CHEST Volume loss in the left hemithorax post left upper lobe resection. Paramediastinal radiation changes again seen. New progressive ill-defined left lower lobe perihilar opacity with adjacent ground-glass opacities. Question of intraluminal debris in the left mainstem bronchus, new. Increased dependent atelectasis in the left lower lobe. Small left pleural effusion with equivocal increase in thickness.  Paramediastinal radiation change in the right upper lobe is stable. Scattered small pulmonary nodules in both lungs again seen, unchanged. Heart is unchanged in size. Tortuous thoracic aorta without aneurysm. No pericardial  effusion. No mediastinal or hilar adenopathy. 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 Minimal fluid in the esophagus without definite wall thickening. There are no acute or suspicious osseous abnormalities. Exaggerated thoracic kyphosis and degenerative change in the thoracic spine and sternal deformity are unchanged. CT ABDOMEN AND PELVIS Tiny hypodensity in the left lobe of the liver, image 56, unchanged from prior allowing for differences in technique. No new hepatic abnormality. Gallbladder physiologically distended without calcified stone. No biliary dilatation. No adrenal nodule. No pancreatic ductal dilatation or surrounding inflammation. Spleen is normal. The stomach is decompressed. There are no dilated or thickened bowel loops. No bowel obstruction, oral contrast throughout the colon. Minimal diverticulosis of the distal colon without diverticulitis. Kidneys demonstrate symmetric enhancement and excretion. 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 No hydronephrosis. No retroperitoneal adenopathy. Abdominal aorta is normal in caliber. Right renal artery aneurysm, small, unchanged. No free air, free fluid, or intra-abdominal fluid collection. Unchanged appearance of the uterus with uterine fibroid. Bladder is physiologically distended. No adnexal mass. There are no acute or suspicious osseous abnormalities. Compression deformity of L4 is unchanged. IMPRESSION: 1. Post left upper lobectomy with postsurgical and post treatment related change in the left lung. Adjacent to the left hilum is new ill-defined and ground-glass opacities. There is questionable intraluminal debris in the left bronchus. Aspiration is considered versus infectious pneumonia. Recurent disease is  not entirely excluded. 2. Small left pleural effusion with minimal increase from prior. 3. No acute abnormality in the abdomen/pelvis. Stable chronic findings as described. Electronically Signed   By: Jeb Levering M.D.   On: 08/24/2015 21:26   Ct Abdomen Pelvis W Contrast  08/24/2015  CLINICAL DATA:  Shortness of breath for 2 days with productive cough. Lung cancer patient currently on chemotherapy. EXAM: CT CHEST, ABDOMEN, AND PELVIS WITH CONTRAST TECHNIQUE: Multidetector CT imaging of the chest, abdomen and pelvis was performed following the standard protocol during bolus administration of intravenous contrast. CONTRAST:  27m OMNIPAQUE IOHEXOL 300 MG/ML SOLN, 1046mOMNIPAQUE IOHEXOL 300 MG/ML SOLN COMPARISON:  Chest radiographs earlier this day. Chest abdomen pelvis CT 05/25/2015 FINDINGS: CT CHEST Volume loss in the left hemithorax post left upper lobe resection. Paramediastinal radiation changes again seen. New progressive ill-defined left lower lobe perihilar opacity with adjacent ground-glass opacities. Question of intraluminal debris in the left mainstem bronchus, new. Increased dependent atelectasis in the left lower lobe. Small left pleural effusion with equivocal increase in thickness. Paramediastinal radiation change in the right upper lobe is stable. Scattered small pulmonary nodules in both lungs again seen, unchanged. Heart is unchanged in size. Tortuous thoracic aorta without aneurysm. No pericardial effusion. No mediastinal or hilar adenopathy. 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 Minimal fluid in the esophagus without definite wall thickening. There are no acute or suspicious osseous abnormalities. Exaggerated thoracic kyphosis and degenerative change in the thoracic spine and sternal deformity are unchanged. CT ABDOMEN AND PELVIS Tiny hypodensity in the left lobe of the liver, image 56, unchanged from prior allowing for differences in technique. No new hepatic abnormality.  Gallbladder physiologically distended without calcified stone. No biliary dilatation. No adrenal nodule. No pancreatic ductal dilatation or surrounding inflammation. Spleen is normal. The stomach is decompressed. There are no dilated or thickened bowel loops. No bowel obstruction, oral contrast throughout the colon. Minimal diverticulosis of the distal colon without diverticulitis. Kidneys demonstrate symmetric enhancement and excretion. 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 No hydronephrosis. No retroperitoneal adenopathy. Abdominal aorta is normal in  caliber. Right renal artery aneurysm, small, unchanged. No free air, free fluid, or intra-abdominal fluid collection. Unchanged appearance of the uterus with uterine fibroid. Bladder is physiologically distended. No adnexal mass. There are no acute or suspicious osseous abnormalities. Compression deformity of L4 is unchanged. IMPRESSION: 1. Post left upper lobectomy with postsurgical and post treatment related change in the left lung. Adjacent to the left hilum is new ill-defined and ground-glass opacities. There is questionable intraluminal debris in the left bronchus. Aspiration is considered versus infectious pneumonia. Recurent disease is not entirely excluded. 2. Small left pleural effusion with minimal increase from prior. 3. No acute abnormality in the abdomen/pelvis. Stable chronic findings as described. Electronically Signed   By: Jeb Levering M.D.   On: 08/24/2015 21:26     EKG: Independently reviewed.    Assessment/Plan:   70 y.o. female with  Principal Problem:   1.      SIRS (systemic inflammatory response syndrome) (HCC)   Sepsis Protocol    IV Vancomyicn and Zosyn   Active Problems:   2.     Malignant neoplasm of brain ([place])   On Experimental Oral Chemo Rx daily   Notify Oncology in AM        3.    HCAP (healthcare-associated pneumonia)/Cough   IV Vancomycin and Zosyn   IVFs   O2 PRN     4.    Chronic  diarrhea   Due to her Oral Chemo Rx     5.   Dementia   Continue Aricept Rx        6.    DVT Prophylaxis   Loveniox   Code Status:     FULL CODE      Family Communication:   Husband and 2 Daughters at  Bedside, Her Husband is Dr. Amadeo Garnet ( a Physician in [place]) Disposition Plan:    Inpatient Status        Time spent:  [place] [facility] Pager [phone]   If [place] Please Contact the Day Rounding Team MD for [facility]  If 7PM-7AM, Please Contact Night-Floor Coverage  [email or web address] Password [email or web address] 08/24/2015, 9:52 PM     ADDENDUM:   Patient was seen and examined on 08/24/2015

## 2015-08-24 NOTE — ED Notes (Signed)
Patient has complained of shortness of breath for 2 days with a productive cough. She is a lung cancer patient taking daily chemo. 100% on 3L with EMS.

## 2015-08-24 NOTE — ED Notes (Signed)
Patient transported to X-ray 

## 2015-08-24 NOTE — Progress Notes (Signed)
ANTIBIOTIC CONSULT NOTE - INITIAL  Pharmacy Consult for Vancomycin, Zosyn Indication: rule out sepsis  No Known Allergies  Patient Measurements:   Last documented weight 80kg (08/09/15)  Vital Signs: Temp: 102.5 F (39.2 C) (01/04 1910) Temp Source: Rectal (01/04 1910) BP: 130/60 mmHg (01/04 1524) Pulse Rate: 97 (01/04 1524) Intake/Output from previous day:   Intake/Output from this shift:    Labs:  Recent Labs  08/24/15 1641  WBC 7.5  HGB 12.2  PLT 171  CREATININE 0.80   CrCl cannot be calculated (Unknown ideal weight.). No results for input(s): VANCOTROUGH, VANCOPEAK, VANCORANDOM, GENTTROUGH, GENTPEAK, GENTRANDOM, TOBRATROUGH, TOBRAPEAK, TOBRARND, AMIKACINPEAK, AMIKACINTROU, AMIKACIN in the last 72 hours.   Microbiology: No results found for this or any previous visit (from the past 720 hour(s)).  Medical History: Past Medical History  Diagnosis Date  . Heart murmur   . Shortness of breath     hx lung ca   . Arthritis   . Anal fistula   . H/O osteoporosis   . H/O vitamin D deficiency   . History of measles, mumps, or rubella   . H/O varicella   . Hypertension   . Yeast infection   . Abnormal Pap smear 2006  . Atrophic vaginitis 2008  . Dyspareunia 2008  . Dementia 2009  . Osteoporosis 2010  . Status post chemotherapy 2003    CARBOPLATIN/PACLITAXEL /STATUS POST CLINICAL TRAIL OF CELEBREX AND IRESSA AT [facility] FOR 1 YEAR  . Status post radiation therapy 2003    LEFT LUNG  . Status post radiation therapy 11/07/2005    WHOLE BRAIN: DR Larkin [facility]  . Status post radiation therapy 06/02/2008    GAMMA KNIFE OF RESECTED CAVITAY  . On antineoplastic chemotherapy     TARCEVA  . Nodule of right lung CT- 06/03/12    RIGHT UPPER LOBE  . Thyroid adenoma     ?  [name] Anxiety   . Cataract   . Nodule of right lung 06/03/12    Upper Lobe  . History of radiation therapy 07/28/13- 08/10/13    right lung metastasis 5000 cGy 10 sessions  . Ankle fracture   . Lung  cancer ([place]) dx'd 2002  . Metastasis to brain [facility]) dx'd 2008  . 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 Thyroid cancer ([place]) 10/18/11 bx    adenoid nodules   . Lung metastasis ([place])     PET scan 05/05/13, RUL lung nodule  . Lung cancer ([place])   . Lung cancer ([place])   . Primary cancer of right upper lobe of lung ([place]) 04/22/2009    Qualifier: Diagnosis of  By: Nils Pyle CMA (AAMA), Mearl Latin      Medications:  Anti-infectives    Start     Dose/Rate Route Frequency Ordered Stop   08/24/15 1915  piperacillin-tazobactam (ZOSYN) IVPB 3.375 g     3.375 g 100 mL/hr over 30 Minutes Intravenous  Once 08/24/15 1905 08/24/15 2019   08/24/15 1915  vancomycin (VANCOCIN) IVPB 1000 mg/200 mL premix     1,000 mg 200 mL/hr over 60 Minutes Intravenous  Once 08/24/15 1905 08/24/15 2140     Assessment: 58 yoF presented to ED on 1/4 with SOB, productive cough, and fatigue.  PMH significant for metastatic non-small cell lung cancer with brain metastasis.  Pharmacy is consulted to dose vancomycin and Zosyn for Sepsis.  Today, 08/24/2015: Tmax 102.5 WBC 7.5 SCr 0.8 with CrCl ~ 70 ml/min (using rounded SCr 1 for age)  Antimicrobials this  admission: 1/4 >> Vanc >> 1/4 >> Zosyn >>  Levels/dose changes this admission: None  Microbiology Results: 1/4 BCx: ordered   Goal of Therapy:  Vancomycin trough level 15-20 mcg/ml Appropriate abx dosing, eradication of infection.   Plan:   Zosyn 3.375g IV Q8H infused over 4hrs.   Vancomycin 1g IV q12h.  Measure Vanc trough at steady state.  Follow up renal fxn, culture results, and clinical course.  Gretta Arab PharmD, BCPS Pager [phone] 08/24/2015 10:02 PM

## 2015-08-24 NOTE — ED Provider Notes (Signed)
CSN: [id number]     Arrival date & time 08/24/15  1516 History   First MD Initiated Contact with Patient 08/24/15 1523     Chief Complaint  Patient presents with  . Shortness of Breath  . Fatigue     (Consider location/radiation/quality/duration/timing/severity/associated sxs/prior Treatment) HPI Patient has medical history of lung cancer on chemotherapy. Patient developed cold symptoms over the weekend. She had nasal drainage, discharge and congestion. She did not have a documented fever. She does however report chills. She reports yesterday she started getting a lot of chest congestion and very wet cough. Today she reports the cough continues and she is feeling short of breath. Patient also feels very fatigued. She has history of lower extremity DVT and a prescription and is taking Xarelto regularly. She denies any new or change swelling or pain in her legs. Patient reports that she hasn't had much of an appetite and hasn't had much to eat or drink over the past 2 days. She endorses nausea but denies vomiting or diarrhea. Past Medical History  Diagnosis Date  . Heart murmur   . Shortness of breath     hx lung ca   . Arthritis   . Anal fistula   . H/O osteoporosis   . H/O vitamin D deficiency   . History of measles, mumps, or rubella   . H/O varicella   . Hypertension   . Yeast infection   . Abnormal Pap smear 2006  . Atrophic vaginitis 2008  . Dyspareunia 2008  . Dementia 2009  . Osteoporosis 2010  . Status post chemotherapy 2003    CARBOPLATIN/PACLITAXEL /STATUS POST CLINICAL TRAIL OF CELEBREX AND IRESSA AT [facility] FOR 1 YEAR  . Status post radiation therapy 2003    LEFT LUNG  . Status post radiation therapy 11/07/2005    WHOLE BRAIN: DR Larkin [facility]  . Status post radiation therapy 06/02/2008    GAMMA KNIFE OF RESECTED CAVITAY  . On antineoplastic chemotherapy     TARCEVA  . Nodule of right lung CT- 06/03/12    RIGHT UPPER LOBE  . Thyroid adenoma     ?  [name] Anxiety   .  Cataract   . Nodule of right lung 06/03/12    Upper Lobe  . History of radiation therapy 07/28/13- 08/10/13    right lung metastasis 5000 cGy 10 sessions  . Ankle fracture   . Lung cancer ([place]) dx'd 2002  . 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 Thyroid cancer ([place]) 10/18/11 bx    adenoid nodules   . Lung metastasis ([place])     PET scan 05/05/13, RUL lung nodule  . Lung cancer ([place])   . Lung cancer ([place])   . Primary cancer of right upper lobe of lung ([place]) 04/22/2009    Qualifier: Diagnosis of  By: Nils Pyle CMA Deborra Medina), Mearl Latin     Past Surgical History  Procedure Laterality Date  . Left occipital craniotomy  09/21/2005    tumor  . Left lower lobectomy  05/2001  . Radical neck dissection  10/18/2011    Procedure: RADICAL NECK DISSECTION;  Surgeon: Izora Gala, MD;  Location: [place];  Service: ENT;  Laterality: Right;  RIGHT MODIFIED NECK DISSECTION /POSSIBLE RIGHT THYROIDECTOM  . Thyroidectomy  10/18/2011    Procedure: THYROIDECTOMY WITH RADICAL NECK DISSECTION;  Surgeon: Izora Gala, MD;  Location: [place];  Service: ENT;  Laterality: Right;  . Lung lobectomy    .  Neck surgery    . Brain surgery    . Orif ankle fracture Left 10/02/2014    Procedure: OPEN REDUCTION INTERNAL FIXATION (ORIF) ANKLE FRACTURE;  Surgeon: Alta Corning, MD;  Location: [facility];  Service: Orthopedics;  Laterality: Left;   Family History  Problem Relation Age of Onset  . Gait disorder Mother   . Cancer Maternal Aunt     menigeoma  . Cancer Mother     Meningioma  . Cancer Father     Pancreatic  . Heart failure Sister    Social History  Substance Use Topics  . Smoking status: Never Smoker   . Smokeless tobacco: Never Used     Comment: smoked few years in college  . Alcohol Use: No   OB History    Gravida Para Term Preterm AB TAB SAB Ectopic Multiple Living   2 0 0 0 0 0 0 0  2     Review of Systems 10 Systems reviewed and are negative for acute change except as  noted in the HPI.    Allergies  Review of patient's allergies indicates no known allergies.  Home Medications   Prior to Admission medications   Medication Sig Start Date End Date Taking? Authorizing Provider  alendronate (FOSAMAX) 70 MG tablet Take 1 tablet (70 mg total) by mouth every 7 (seven) days. Saturdays; Take with a full glass of water on an empty stomach. 04/09/12  Yes Eldred Manges, MD  Ascorbic Acid (VITAMIN C) 100 MG tablet Take 100 mg by mouth daily.   Yes Historical Provider, MD  cholecalciferol (VITAMIN D) 1000 UNITS tablet Take 1,000 Units by mouth at bedtime.   Yes Historical Provider, MD  donepezil (ARICEPT) 10 MG tablet Take 10 mg by mouth daily. 12/31/14  Yes Historical Provider, MD  loperamide (IMODIUM) 2 MG capsule Take 2 mg by mouth as needed for diarrhea or loose stools.    Yes Historical Provider, MD  Multiple Vitamin (MULTI-VITAMINS) TABS Take 1 tablet by mouth daily.    Yes Historical Provider, MD  naproxen sodium (ANAPROX) 220 MG tablet Take 220 mg by mouth 2 (two) times daily as needed (pain).   Yes Historical Provider, MD  research study medication Take home supply. Patient taking differently: Take 80 mg by mouth at bedtime. Take home supply. 10/05/14  Yes Christina P Rama, MD  XARELTO 20 MG TABS tablet Take 1 tablet (20 mg total) by mouth daily. 01/03/15  Yes Curt Bears, MD  UNABLE TO FIND Med Name: Rayburn Felt Dx primary Cancer of right upper lobe of lung Patient not taking: Reported on 08/24/2015 08/09/15   Curt Bears, MD   BP 130/60 mmHg  Pulse 97  Temp(Src) 102.5 F (39.2 C) (Rectal)  Resp 18  SpO2 92% Physical Exam  Constitutional: She is oriented to person, place, and time.  Patient is deconditioned in appearance. She is alert and nontoxic. No acute respiratory distress at rest.  HENT:  Head: Normocephalic and atraumatic.  Nose: Nose normal.  Mouth/Throat: Oropharynx is clear and moist.  Eyes: EOM are normal. Pupils are equal, round, and  reactive to light.  Neck: Neck supple.  Cardiovascular: Normal rate, regular rhythm, normal heart sounds and intact distal pulses.   Pulmonary/Chest: Effort normal.  Patient has occasional rhonchi and very wet cough. Coarse breath sounds mid right lung field.  Abdominal: Soft. Bowel sounds are normal. She exhibits no distension. There is no tenderness.  Musculoskeletal: Normal range of motion. She exhibits no edema or  tenderness.  Neurological: She is alert and oriented to person, place, and time. She has normal strength. Coordination normal. GCS eye subscore is 4. GCS verbal subscore is 5. GCS motor subscore is 6.  Skin: Skin is warm, dry and intact.  Psychiatric: She has a normal mood and affect.    ED Course  Procedures (including critical care time) Labs Review Labs Reviewed  COMPREHENSIVE METABOLIC PANEL - Abnormal; Notable for the following:    Glucose, Bld 148 (*)    All other components within normal limits  CBC WITH DIFFERENTIAL/PLATELET - Abnormal; Notable for the following:    Lymphs Abs 0.2 (*)    All other components within normal limits  CULTURE, BLOOD (ROUTINE X 2)  CULTURE, BLOOD (ROUTINE X 2)  TROPONIN I  BRAIN NATRIURETIC PEPTIDE  URINALYSIS, ROUTINE W REFLEX MICROSCOPIC (NOT AT [facility])  INFLUENZA PANEL BY PCR (TYPE A & B, H1N1)  I-STAT CG4 LACTIC ACID, ED    Imaging Review Dg Chest 2 View  08/24/2015  CLINICAL DATA:  Shortness of breath, productive cough. EXAM: CHEST  2 VIEW COMPARISON:  September 30, 2014. FINDINGS: Stable cardiomegaly is noted. Postsurgical changes are again identified in the left hilum. No pneumothorax is noted. There is again noted elevated left hemidiaphragm posteriorly. Patient is rotated to the left. No acute pulmonary disease is noted. No definite pleural effusion is noted. IMPRESSION: Stable postsurgical changes as described above. No acute abnormality seen. Electronically Signed   By: Marijo Conception, M.D.   On: 08/24/2015 17:00   I have  personally reviewed and evaluated these images and lab results as part of my medical decision-making.   EKG Interpretation None     Consult: Patient's case was reviewed with oncology covering for Dr. Earlie Server. Plan will be for empiric treatment with Zosyn and vancomycin with cultures pending. Surveillance CT chest and abdomen had been planned by Dr. Earlie Server within the next 2 weeks. We will Obtain those as well. Consult: Patient's case was reviewed with Dr. Arnoldo Morale hospitalist for admission. MDM   Final diagnoses:  Malignant neoplasm of left lung, unspecified part of lung ([place])  Other specified fever  Cough   Patient has known metastatic lung cancer. She developed URI symptoms approximately 3 days ago. In the past 2 days she has developed a productive cough and shortness of breath. Patient is febrile to 102. At this point she will be empirically started on broad-spectrum antibiotics and cultures obtained. cHest x-ray does not show focal pneumonia, CT scan and abdominal CT are pending at this time. Patient will be admitted for ongoing treatment. At this time her mental status is clear and she is not in acute respiratory distress.    Charlesetta Shanks, MD 08/24/15 2109

## 2015-08-24 NOTE — ED Notes (Signed)
Bed: WA06 Expected date:  Expected time:  Means of arrival:  Comments: ems

## 2015-08-24 NOTE — ED Notes (Signed)
Pt needed to use restroom, however, didn't get to her in time and pt soiled pants. Changed pt and placed pt in depends.

## 2015-08-25 DIAGNOSIS — J101 Influenza due to other identified influenza virus with other respiratory manifestations: Secondary | ICD-10-CM | POA: Diagnosis present

## 2015-08-25 DIAGNOSIS — R6510 Systemic inflammatory response syndrome (SIRS) of non-infectious origin without acute organ dysfunction: Secondary | ICD-10-CM

## 2015-08-25 LAB — BASIC METABOLIC PANEL
Anion gap: 8 (ref 5–15)
BUN: 14 mg/dL (ref 6–20)
CO2: 25 mmol/L (ref 22–32)
Calcium: 8.9 mg/dL (ref 8.9–10.3)
Chloride: 104 mmol/L (ref 101–111)
Creatinine, Ser: 0.69 mg/dL (ref 0.44–1.00)
GFR calc Af Amer: >60 mL/min (ref >60)
GFR calc non Af Amer: >60 mL/min (ref >60)
Glucose, Bld: 101 mg/dL — ABNORMAL HIGH (ref 65–99)
Potassium: 3.7 mmol/L (ref 3.5–5.1)
Sodium: 137 mmol/L (ref 135–145)

## 2015-08-25 LAB — INFLUENZA PANEL BY PCR (TYPE A & B)
H1N1 flu by pcr: NOT DETECTED
Influenza A By PCR: POSITIVE — AB
Influenza B By PCR: NEGATIVE

## 2015-08-25 LAB — URINALYSIS, ROUTINE W REFLEX MICROSCOPIC
Bilirubin Urine: NEGATIVE
Glucose, UA: NEGATIVE mg/dL
Ketones, ur: NEGATIVE mg/dL
Leukocytes, UA: NEGATIVE
Nitrite: NEGATIVE
Protein, ur: NEGATIVE mg/dL
Specific Gravity, Urine: >1.046 — ABNORMAL HIGH (ref 1.005–1.030)
pH: 5 (ref 5.0–8.0)

## 2015-08-25 LAB — CBC
HCT: 36.6 % (ref 36.0–46.0)
Hemoglobin: 11.8 g/dL — ABNORMAL LOW (ref 12.0–15.0)
MCH: 30.1 pg (ref 26.0–34.0)
MCHC: 32.2 g/dL (ref 30.0–36.0)
MCV: 93.4 fL (ref 78.0–100.0)
Platelets: 135 10*3/uL — ABNORMAL LOW (ref 150–400)
RBC: 3.92 MIL/uL (ref 3.87–5.11)
RDW: 13.9 % (ref 11.5–15.5)
WBC: 6.2 10*3/uL (ref 4.0–10.5)

## 2015-08-25 LAB — URINE MICROSCOPIC-ADD ON

## 2015-08-25 MED ORDER — CHLORHEXIDINE GLUCONATE 0.12 % MT SOLN
15.0000 mL | Freq: Two times a day (BID) | OROMUCOSAL | Status: DC
  Administered 2015-08-25 – 2015-08-30 (×10): 15 mL via OROMUCOSAL
  Filled 2015-08-25 (×10): qty 15

## 2015-08-25 MED ORDER — DM-GUAIFENESIN ER 30-600 MG PO TB12
2.0000 | ORAL_TABLET | Freq: Two times a day (BID) | ORAL | Status: DC
  Administered 2015-08-25 – 2015-08-27 (×4): 2 via ORAL
  Filled 2015-08-25 (×4): qty 2

## 2015-08-25 MED ORDER — SODIUM CHLORIDE 0.9 % IV SOLN
INTRAVENOUS | Status: DC
  Administered 2015-08-25 – 2015-08-27 (×4): via INTRAVENOUS

## 2015-08-25 MED ORDER — ACETAMINOPHEN 325 MG PO TABS
650.0000 mg | ORAL_TABLET | Freq: Four times a day (QID) | ORAL | Status: DC | PRN
Start: 2015-08-25 — End: 2015-08-30

## 2015-08-25 MED ORDER — CETYLPYRIDINIUM CHLORIDE 0.05 % MT LIQD
7.0000 mL | Freq: Two times a day (BID) | OROMUCOSAL | Status: DC
  Administered 2015-08-25 – 2015-08-29 (×3): 7 mL via OROMUCOSAL

## 2015-08-25 MED ORDER — ONDANSETRON HCL 4 MG/2ML IJ SOLN
4.0000 mg | Freq: Four times a day (QID) | INTRAMUSCULAR | Status: DC | PRN
  Administered 2015-08-25 – 2015-08-29 (×2): 4 mg via INTRAVENOUS
  Filled 2015-08-25 (×2): qty 2

## 2015-08-25 MED ORDER — ONDANSETRON HCL 4 MG PO TABS: 4.0000 mg | ORAL_TABLET | Freq: Four times a day (QID) | ORAL | Status: DC | PRN

## 2015-08-25 MED ORDER — ACETAMINOPHEN 650 MG RE SUPP: 650.0000 mg | Freq: Four times a day (QID) | RECTAL | Status: DC | PRN

## 2015-08-25 MED ORDER — HYDROMORPHONE HCL 1 MG/ML IJ SOLN
0.5000 mg | INTRAMUSCULAR | Status: DC | PRN
  Administered 2015-08-25: 1 mg via INTRAVENOUS
  Filled 2015-08-25: qty 1

## 2015-08-25 MED ORDER — ADULT MULTIVITAMIN W/MINERALS CH
1.0000 | ORAL_TABLET | Freq: Every day | ORAL | Status: DC
  Administered 2015-08-25 – 2015-08-30 (×6): 1 via ORAL
  Filled 2015-08-25 (×6): qty 1

## 2015-08-25 MED ORDER — DONEPEZIL HCL 5 MG PO TABS
10.0000 mg | ORAL_TABLET | Freq: Every day | ORAL | Status: DC
  Administered 2015-08-25 – 2015-08-30 (×6): 10 mg via ORAL
  Filled 2015-08-25 (×6): qty 2

## 2015-08-25 MED ORDER — OXYCODONE HCL 5 MG PO TABS
5.0000 mg | ORAL_TABLET | ORAL | Status: DC | PRN
  Filled 2015-08-25: qty 1

## 2015-08-25 MED ORDER — VITAMIN D 1000 UNITS PO TABS
1000.0000 [IU] | ORAL_TABLET | Freq: Every day | ORAL | Status: DC
  Administered 2015-08-25 – 2015-08-29 (×6): 1000 [IU] via ORAL
  Filled 2015-08-25 (×6): qty 1

## 2015-08-25 MED ORDER — ALENDRONATE SODIUM 70 MG PO TABS: 70.0000 mg | ORAL_TABLET | ORAL | Status: DC

## 2015-08-25 MED ORDER — PANTOPRAZOLE SODIUM 40 MG PO TBEC
40.0000 mg | DELAYED_RELEASE_TABLET | Freq: Every day | ORAL | Status: DC
  Administered 2015-08-25 – 2015-08-29 (×5): 40 mg via ORAL
  Filled 2015-08-25 (×5): qty 1

## 2015-08-25 MED ORDER — SODIUM CHLORIDE 0.9 % IJ SOLN
3.0000 mL | Freq: Two times a day (BID) | INTRAMUSCULAR | Status: DC
  Administered 2015-08-25 – 2015-08-30 (×8): 3 mL via INTRAVENOUS

## 2015-08-25 MED ORDER — OSELTAMIVIR PHOSPHATE 75 MG PO CAPS
75.0000 mg | ORAL_CAPSULE | Freq: Two times a day (BID) | ORAL | Status: AC
  Administered 2015-08-25 – 2015-08-29 (×10): 75 mg via ORAL
  Filled 2015-08-25 (×11): qty 1

## 2015-08-25 MED ORDER — ALUM & MAG HYDROXIDE-SIMETH 200-200-20 MG/5ML PO SUSP: 30.0000 mL | Freq: Four times a day (QID) | ORAL | Status: DC | PRN

## 2015-08-25 MED ORDER — RIVAROXABAN 20 MG PO TABS
20.0000 mg | ORAL_TABLET | Freq: Every day | ORAL | Status: DC
  Administered 2015-08-25 – 2015-08-29 (×5): 20 mg via ORAL
  Filled 2015-08-25 (×6): qty 1

## 2015-08-25 MED ORDER — SODIUM CHLORIDE 0.9 % IV SOLN: INTRAVENOUS | Status: AC

## 2015-08-25 NOTE — ED Notes (Signed)
Pt had blood cultures x 2 ordered at 1545, this RN called down to lab to verify that culture had been drawn by previous nurse. RN told only one set of cultures had been drawn, second set was not down there. Pt had already received 2 antibiotics by this time.

## 2015-08-25 NOTE — Progress Notes (Signed)
[facility] PROGRESS NOTE  Valerie [name] XNT:[id number] DOB: 06-21-1946 DOA: 08/24/2015 PCP: [name], MD  Summary 08/25/15: I have seen and examined Valerie [name] at bedside in the presence of her husband, reviewed her chart and discussed with Dr. Earlie Server over the phone. Valerie [name] is a pleasant 70 y.o. female with a history of Brain Cancer dx in 2007 S/P Radiation Rx and on Experimental Chemo RX who presented to the ED with complaints of Increased SOB and Cough and chest congestion x for 3 days associated with fever and she was found to have Influenza Type A and possibly healthcare associated pneumonia/aspiration pneumonitis with CT chest abdomen and pelvis showing "1. Post left upper lobectomy with postsurgical and post treatment related change in the left lung. Adjacent to the left hilum is new ill-defined and ground-glass opacities. There is questionable intraluminal debris in the left bronchus. Aspiration is considered versus infectious pneumonia. Recurent disease is not entirely excluded. 2. Small left pleural effusion with minimal increase from prior. 3. No acute abnormality in the abdomen/pelvis. Stable chronic findings as described". Blood culture shows no growth to date. Will therefore continue Zosyn, discontinue vancomycin, give Mucinex and Tamiflu. Will hold chemotherapy for now until patient gets discharged. Plan SIRS (systemic inflammatory response syndrome) (HCC)/HCAP (healthcare-associated pneumonia)/Influenza A/Cough  Follow blood cultures  Day 2 Zosyn  Discontinue vancomycin  Tamiflu  Mucinex  Consult physical therapy to ambulate Malignant neoplasm of brain (HCC)/Dementia/Chronic diarrhea  Normal acute changes  Hold chemotherapy in-house  Code Status: Full Code Family Communication: Discussed with husband at bedside Disposition Plan: Home Eventually   Consultants:  Dr. Earlie Server over the  phone  Procedures:  None  Antibiotics:  Vancomycin 08/24/2015>08/25/2015  Zosyn 08/24/2015>  Tamiflu 08/25/2015>  HPI/Subjective: Complains of cough.  Objective: Filed Vitals:   08/25/15 0947 08/25/15 1415  BP: 134/64 118/56  Pulse: 88 85  Temp: 98.7 F (37.1 C) 98.2 F (36.8 C)  Resp: 16 20    Intake/Output Summary (Last 24 hours) at 08/25/15 1729 Last data filed at 08/25/15 1455  Gross per 24 hour  Intake 1905.42 ml  Output    175 ml  Net 1730.42 ml   Filed Weights   08/24/15 2206 08/25/15 0051  Weight: 84.823 kg (187 lb) 84.5 kg (186 lb 4.6 oz)    Exam:   General:  Lethargic, coughing.  Cardiovascular: S1-S2 normal. No murmurs. Pulse regular.  Respiratory: Wet cough.  Abdomen: Soft and nontender. Normal bowel sounds. No organomegaly.  Musculoskeletal: No pedal edema   Neurological: Intact  Data Reviewed: Basic Metabolic Panel:  Recent Labs Lab 08/24/15 1641 08/25/15 0428  NA 139 137  K 3.9 3.7  CL 103 104  CO2 26 25  GLUCOSE 148* 101*  BUN 15 14  CREATININE 0.80 0.69  CALCIUM 9.5 8.9   Liver Function Tests:  Recent Labs Lab 08/24/15 1641  AST 19  ALT 16  ALKPHOS 58  BILITOT 0.7  PROT 7.5  ALBUMIN 4.5   No results for input(s): LIPASE, AMYLASE in the last 168 hours. No results for input(s): AMMONIA in the last 168 hours. CBC:  Recent Labs Lab 08/24/15 1641 08/25/15 0428  WBC 7.5 6.2  NEUTROABS 6.9  --   HGB 12.2 11.8*  HCT 38.6 36.6  MCV 92.8 93.4  PLT 171 135*   Cardiac Enzymes:  Recent Labs Lab 08/24/15 1641  TROPONINI <0.03   BNP (last 3 results)  Recent Labs  08/24/15 1641  BNP 254.3*    ProBNP (  last 3 results) No results for input(s): PROBNP in the last 8760 hours.  CBG: No results for input(s): GLUCAP in the last 168 hours.  Recent Results (from the past 240 hour(s))  Culture, blood (routine x 2)     Status: None (Preliminary result)   Collection Time: 08/24/15  4:41 PM  Result Value Ref  Range Status   Specimen Description BLOOD RIGHT ANTECUBITAL  Final   Special Requests BOTTLES DRAWN AEROBIC AND ANAEROBIC 5CC EACH  Final   Culture   Final    NO GROWTH < 24 HOURS Performed at [facility]    Report Status PENDING  Incomplete     Studies: Dg Chest 2 View  08/24/2015  CLINICAL DATA:  Shortness of breath, productive cough. EXAM: CHEST  2 VIEW COMPARISON:  September 30, 2014. FINDINGS: Stable cardiomegaly is noted. Postsurgical changes are again identified in the left hilum. No pneumothorax is noted. There is again noted elevated left hemidiaphragm posteriorly. Patient is rotated to the left. No acute pulmonary disease is noted. No definite pleural effusion is noted. IMPRESSION: Stable postsurgical changes as described above. No acute abnormality seen. Electronically Signed   By: Marijo Conception, M.D.   On: 08/24/2015 17:00   Ct Chest W Contrast  08/24/2015  CLINICAL DATA:  Shortness of breath for 2 days with productive cough. Lung cancer patient currently on chemotherapy. EXAM: CT CHEST, ABDOMEN, AND PELVIS WITH CONTRAST TECHNIQUE: Multidetector CT imaging of the chest, abdomen and pelvis was performed following the standard protocol during bolus administration of intravenous contrast. CONTRAST:  77m OMNIPAQUE IOHEXOL 300 MG/ML SOLN, 1041mOMNIPAQUE IOHEXOL 300 MG/ML SOLN COMPARISON:  Chest radiographs earlier this day. Chest abdomen pelvis CT 05/25/2015 FINDINGS: CT CHEST Volume loss in the left hemithorax post left upper lobe resection. Paramediastinal radiation changes again seen. New progressive ill-defined left lower lobe perihilar opacity with adjacent ground-glass opacities. Question of intraluminal debris in the left mainstem bronchus, new. Increased dependent atelectasis in the left lower lobe. Small left pleural effusion with equivocal increase in thickness. Paramediastinal radiation change in the right upper lobe is stable. Scattered small pulmonary nodules in both lungs  again seen, unchanged. Heart is unchanged in size. Tortuous thoracic aorta without aneurysm. No pericardial effusion. No mediastinal or hilar adenopathy. 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 Minimal fluid in the esophagus without definite wall thickening. There are no acute or suspicious osseous abnormalities. Exaggerated thoracic kyphosis and degenerative change in the thoracic spine and sternal deformity are unchanged. CT ABDOMEN AND PELVIS Tiny hypodensity in the left lobe of the liver, image 56, unchanged from prior allowing for differences in technique. No new hepatic abnormality. Gallbladder physiologically distended without calcified stone. No biliary dilatation. No adrenal nodule. No pancreatic ductal dilatation or surrounding inflammation. Spleen is normal. The stomach is decompressed. There are no dilated or thickened bowel loops. No bowel obstruction, oral contrast throughout the colon. Minimal diverticulosis of the distal colon without diverticulitis. Kidneys demonstrate symmetric enhancement and excretion. 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 No hydronephrosis. No retroperitoneal adenopathy. Abdominal aorta is normal in caliber. Right renal artery aneurysm, small, unchanged. No free air, free fluid, or intra-abdominal fluid collection. Unchanged appearance of the uterus with uterine fibroid. Bladder is physiologically distended. No adnexal mass. There are no acute or suspicious osseous abnormalities. Compression deformity of L4 is unchanged. IMPRESSION: 1. Post left upper lobectomy with postsurgical and post treatment related change in the left lung. Adjacent to the left hilum is  new ill-defined and ground-glass opacities. There is questionable intraluminal debris in the left bronchus. Aspiration is considered versus infectious pneumonia. Recurent disease is not entirely excluded. 2. Small left pleural effusion with minimal increase from prior. 3. No acute abnormality in the  abdomen/pelvis. Stable chronic findings as described. Electronically Signed   By: Jeb Levering M.D.   On: 08/24/2015 21:26   Ct Abdomen Pelvis W Contrast  08/24/2015  CLINICAL DATA:  Shortness of breath for 2 days with productive cough. Lung cancer patient currently on chemotherapy. EXAM: CT CHEST, ABDOMEN, AND PELVIS WITH CONTRAST TECHNIQUE: Multidetector CT imaging of the chest, abdomen and pelvis was performed following the standard protocol during bolus administration of intravenous contrast. CONTRAST:  39m OMNIPAQUE IOHEXOL 300 MG/ML SOLN, 1083mOMNIPAQUE IOHEXOL 300 MG/ML SOLN COMPARISON:  Chest radiographs earlier this day. Chest abdomen pelvis CT 05/25/2015 FINDINGS: CT CHEST Volume loss in the left hemithorax post left upper lobe resection. Paramediastinal radiation changes again seen. New progressive ill-defined left lower lobe perihilar opacity with adjacent ground-glass opacities. Question of intraluminal debris in the left mainstem bronchus, new. Increased dependent atelectasis in the left lower lobe. Small left pleural effusion with equivocal increase in thickness. Paramediastinal radiation change in the right upper lobe is stable. Scattered small pulmonary nodules in both lungs again seen, unchanged. Heart is unchanged in size. Tortuous thoracic aorta without aneurysm. No pericardial effusion. No mediastinal or hilar adenopathy. 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 Minimal fluid in the esophagus without definite wall thickening. There are no acute or suspicious osseous abnormalities. Exaggerated thoracic kyphosis and degenerative change in the thoracic spine and sternal deformity are unchanged. CT ABDOMEN AND PELVIS Tiny hypodensity in the left lobe of the liver, image 56, unchanged from prior allowing for differences in technique. No new hepatic abnormality. Gallbladder physiologically distended without calcified stone. No biliary dilatation. No adrenal nodule. No pancreatic  ductal dilatation or surrounding inflammation. Spleen is normal. The stomach is decompressed. There are no dilated or thickened bowel loops. No bowel obstruction, oral contrast throughout the colon. Minimal diverticulosis of the distal colon without diverticulitis. Kidneys demonstrate symmetric enhancement and excretion. 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 No hydronephrosis. No retroperitoneal adenopathy. Abdominal aorta is normal in caliber. Right renal artery aneurysm, small, unchanged. No free air, free fluid, or intra-abdominal fluid collection. Unchanged appearance of the uterus with uterine fibroid. Bladder is physiologically distended. No adnexal mass. There are no acute or suspicious osseous abnormalities. Compression deformity of L4 is unchanged. IMPRESSION: 1. Post left upper lobectomy with postsurgical and post treatment related change in the left lung. Adjacent to the left hilum is new ill-defined and ground-glass opacities. There is questionable intraluminal debris in the left bronchus. Aspiration is considered versus infectious pneumonia. Recurent disease is not entirely excluded. 2. Small left pleural effusion with minimal increase from prior. 3. No acute abnormality in the abdomen/pelvis. Stable chronic findings as described. Electronically Signed   By: MeJeb Levering.D.   On: 08/24/2015 21:26    Scheduled Meds: . sodium chloride   Intravenous STAT  . antiseptic oral rinse  7 mL Mouth Rinse q12n4p  . chlorhexidine  15 mL Mouth Rinse BID  . cholecalciferol  1,000 Units Oral QHS  . dextromethorphan-guaiFENesin  2 tablet Oral BID  . donepezil  10 mg Oral Daily  . multivitamin with minerals  1 tablet Oral Daily  . oseltamivir  75 mg Oral BID  . pantoprazole  40 mg Oral Q1200  . piperacillin-tazobactam (  ZOSYN)  IV  3.375 g Intravenous Q8H  . rivaroxaban  20 mg Oral QHS  . sodium chloride  3 mL Intravenous Q12H   Continuous Infusions: . sodium chloride 125 mL/hr at  08/24/15 1949  . sodium chloride 75 mL/hr at 08/25/15 0222     Time spent: 25 minutes    RANGA,[email or web address]  [facility] Pager [phone]. If 7PM-7AM, please contact night-coverage at [email or web address], password [facility] 08/25/2015, 5:29 PM  LOS: 1 day

## 2015-08-26 LAB — C DIFFICILE QUICK SCREEN W PCR REFLEX
C Diff antigen: NEGATIVE
C Diff interpretation: NEGATIVE
C Diff toxin: NEGATIVE

## 2015-08-26 LAB — BASIC METABOLIC PANEL
Anion gap: 9 (ref 5–15)
BUN: 14 mg/dL (ref 6–20)
CO2: 26 mmol/L (ref 22–32)
Calcium: 8.4 mg/dL — ABNORMAL LOW (ref 8.9–10.3)
Chloride: 103 mmol/L (ref 101–111)
Creatinine, Ser: 0.65 mg/dL (ref 0.44–1.00)
GFR calc Af Amer: >60 mL/min (ref >60)
GFR calc non Af Amer: >60 mL/min (ref >60)
Glucose, Bld: 114 mg/dL — ABNORMAL HIGH (ref 65–99)
Potassium: 3.7 mmol/L (ref 3.5–5.1)
Sodium: 138 mmol/L (ref 135–145)

## 2015-08-26 LAB — CBC
HCT: 35.1 % — ABNORMAL LOW (ref 36.0–46.0)
Hemoglobin: 11.1 g/dL — ABNORMAL LOW (ref 12.0–15.0)
MCH: 30.2 pg (ref 26.0–34.0)
MCHC: 31.6 g/dL (ref 30.0–36.0)
MCV: 95.6 fL (ref 78.0–100.0)
Platelets: 146 10*3/uL — ABNORMAL LOW (ref 150–400)
RBC: 3.67 MIL/uL — ABNORMAL LOW (ref 3.87–5.11)
RDW: 14.1 % (ref 11.5–15.5)
WBC: 4.6 10*3/uL (ref 4.0–10.5)

## 2015-08-26 NOTE — Progress Notes (Signed)
Pharmacy Antibiotic Follow-up Note  Valerie [name] is a 70 y.o. year-old female admitted on 08/24/2015.  The patient is currently on day 3 of Zosyn 3.375gm IV Q8h to be infused over 4hrs for PNA (aspiration vs HCA).  PMH significant for Brain mets and she is currently on oral chemo ([place]).    Assessment/Plan: This patient's current antibiotics will be continued without adjustments.  No further renal dose adjustments anticipated.  Pharmacy will sign off.   Duration per MD- change to oral therapy/ de-escalate once clinically warranted.  Temp (24hrs), Avg:98.1 F (36.7 C), Min:97.9 F (36.6 C), Max:98.5 F (36.9 C)   Recent Labs Lab 08/24/15 1641 08/25/15 0428 08/26/15 0405  WBC 7.5 6.2 4.6    Recent Labs Lab 08/24/15 1641 08/25/15 0428 08/26/15 0405  CREATININE 0.80 0.69 0.65   Estimated Creatinine Clearance: 73.1 mL/min (by C-G formula based on Cr of 0.65).    No Known Allergies  Antimicrobials this admission: 1/4 >> Vanc >>1/5 1/4 >> Zosyn >> 1/5>>Tamiflu>>  Levels/dose changes this admission: None  Microbiology results: 1/4 BCx: NGTD 1/5 Influenza panel +Flu A  Thank you for allowing pharmacy to be a part of this patient's care.  Biagio Borg PharmD 08/26/2015 1:28 PM

## 2015-08-26 NOTE — Progress Notes (Signed)
[facility] PROGRESS NOTE  Valerie [name] TTS:[id number] DOB: 03-30-1946 DOA: 08/24/2015 PCP: [name], MD  Summary 08/25/15: I have seen and examined Valerie [name] at bedside in the presence of her husband, reviewed her chart and discussed with Dr. Earlie Server over the phone. Valerie [name] is a pleasant 70 y.o. female with a history of Brain Cancer dx in 2007 S/P Radiation Rx and on Experimental Chemo RX who presented to the ED with complaints of Increased SOB and Cough and chest congestion x for 3 days associated with fever and she was found to have Influenza Type A and possibly healthcare associated pneumonia/aspiration pneumonitis with CT chest abdomen and pelvis showing "1. Post left upper lobectomy with postsurgical and post treatment related change in the left lung. Adjacent to the left hilum is new ill-defined and ground-glass opacities. There is questionable intraluminal debris in the left bronchus. Aspiration is considered versus infectious pneumonia. Recurent disease is not entirely excluded. 2. Small left pleural effusion with minimal increase from prior. 3. No acute abnormality in the abdomen/pelvis. Stable chronic findings as described". Blood culture shows no growth to date. Will therefore continue Zosyn, discontinue vancomycin, give Mucinex and Tamiflu. Will hold chemotherapy for now until patient gets discharged. 08/26/15: Blood cultures remain negative. Had diarrhea today? Antibiotic induced-?C diff. Will check C diff pcr and continue current management, give antispasmodics if C.diff negative and diarrhea persists. Plan SIRS (systemic inflammatory response syndrome) (HCC)/HCAP (healthcare-associated pneumonia)/Influenza A/Cough  Follow blood cultures  Day 3 Zosyn  Day 2 Tamiflu  Continue Mucinex Malignant neoplasm of brain (HCC)/Dementia/Chronic diarrhea  No acute changes  Hold chemotherapy in-house  Code Status: Full Code Family Communication: Discussed with  husband at bedside on 08/25/15 Disposition Plan: Home Eventually   Consultants:  Dr. Earlie Server over the phone  Procedures:  None  Antibiotics:  Vancomycin 08/24/2015>08/25/2015  Zosyn 08/24/2015>  Tamiflu 08/25/2015>  HPI/Subjective: Diarrhea since last night, less cough.  Objective: Filed Vitals:   08/26/15 1330 08/26/15 2026  BP: 134/61 131/62  Pulse: 80 79  Temp: 98.4 F (36.9 C) 99.2 F (37.3 C)  Resp: 18 18    Intake/Output Summary (Last 24 hours) at 08/26/15 2315 Last data filed at 08/26/15 1855  Gross per 24 hour  Intake   2205 ml  Output    125 ml  Net   2080 ml   Filed Weights   08/24/15 2206 08/25/15 0051  Weight: 84.823 kg (187 lb) 84.5 kg (186 lb 4.6 oz)    Exam:   General:  Comfortable at rest.  Cardiovascular: S1-S2 normal. No murmurs. Pulse regular.  Respiratory: Good air entry bilaterally. No rhonchi or rales.  Abdomen: Soft and nontender. Normal bowel sounds. No organomegaly.  Musculoskeletal: No pedal edema   Neurological: Intact  Data Reviewed: Basic Metabolic Panel:  Recent Labs Lab 08/24/15 1641 08/25/15 0428 08/26/15 0405  NA 139 137 138  K 3.9 3.7 3.7  CL 103 104 103  CO2 '26 25 26  '$ GLUCOSE 148* 101* 114*  BUN '15 14 14  '$ CREATININE 0.80 0.69 0.65  CALCIUM 9.5 8.9 8.4*   Liver Function Tests:  Recent Labs Lab 08/24/15 1641  AST 19  ALT 16  ALKPHOS 58  BILITOT 0.7  PROT 7.5  ALBUMIN 4.5   No results for input(s): LIPASE, AMYLASE in the last 168 hours. No results for input(s): AMMONIA in the last 168 hours. CBC:  Recent Labs Lab 08/24/15 1641 08/25/15 0428 08/26/15 0405  WBC 7.5 6.2 4.6  NEUTROABS 6.9  --   --  HGB 12.2 11.8* 11.1*  HCT 38.6 36.6 35.1*  MCV 92.8 93.4 95.6  PLT 171 135* 146*   Cardiac Enzymes:  Recent Labs Lab 08/24/15 1641  TROPONINI <0.03   BNP (last 3 results)  Recent Labs  08/24/15 1641  BNP 254.3*    ProBNP (last 3 results) No results for input(s): PROBNP in  the last 8760 hours.  CBG: No results for input(s): GLUCAP in the last 168 hours.  Recent Results (from the past 240 hour(s))  Culture, blood (routine x 2)     Status: None (Preliminary result)   Collection Time: 08/24/15  4:41 PM  Result Value Ref Range Status   Specimen Description BLOOD RIGHT ANTECUBITAL  Final   Special Requests BOTTLES DRAWN AEROBIC AND ANAEROBIC 5CC EACH  Final   Culture   Final    NO GROWTH 2 DAYS Performed at [facility]    Report Status PENDING  Incomplete  Culture, blood (routine x 2)     Status: None (Preliminary result)   Collection Time: 08/25/15  4:28 AM  Result Value Ref Range Status   Specimen Description BLOOD LEFT ARM  Final   Special Requests IN PEDIATRIC BOTTLE [place]  Final   Culture   Final    NO GROWTH 1 DAY Performed at [facility]    Report Status PENDING  Incomplete     Studies: No results found.  Scheduled Meds: . antiseptic oral rinse  7 mL Mouth Rinse q12n4p  . chlorhexidine  15 mL Mouth Rinse BID  . cholecalciferol  1,000 Units Oral QHS  . dextromethorphan-guaiFENesin  2 tablet Oral BID  . donepezil  10 mg Oral Daily  . multivitamin with minerals  1 tablet Oral Daily  . oseltamivir  75 mg Oral BID  . pantoprazole  40 mg Oral Q1200  . piperacillin-tazobactam (ZOSYN)  IV  3.375 g Intravenous Q8H  . rivaroxaban  20 mg Oral QHS  . sodium chloride  3 mL Intravenous Q12H   Continuous Infusions: . sodium chloride 125 mL/hr at 08/24/15 1949  . sodium chloride 75 mL/hr at 08/26/15 0826     Time spent: 25 minutes    RANGA,SIMBISO  [facility] Pager [phone]. If 7PM-7AM, please contact night-coverage at [email or web address], password [facility] 08/26/2015, 11:15 PM  LOS: 2 days

## 2015-08-26 NOTE — Care Management Note (Signed)
Case Management Note  Patient Details  Name: ADELI FROST MRN: [id number] Date of Birth: 11-13-1945  Subjective/Objective:   Admitted with Flu/PNA                Action/Plan: plan to dc home with no needs   Expected Discharge Date:   (unknown)               Expected Discharge Plan:  Home/Self Care  In-House Referral:     Discharge planning Services  CM Consult  Post Acute Care Choice:    Choice offered to:     DME Arranged:    DME Agency:     HH Arranged:    [place] Agency:     Status of Service:     Medicare Important Message Given:    Date Medicare IM Given:    Medicare IM give by:    Date Additional Medicare IM Given:    Additional Medicare Important Message give by:     If discussed at [place] of Stay Meetings, dates discussed:    Additional CommentsPurcell Mouton, RN 08/26/2015, 3:34 PM

## 2015-08-27 LAB — CBC WITH DIFFERENTIAL/PLATELET
Basophils Absolute: 0 10*3/uL (ref 0.0–0.1)
Basophils Relative: 0 %
Eosinophils Absolute: 0 10*3/uL (ref 0.0–0.7)
Eosinophils Relative: 0 %
HCT: 34.5 % — ABNORMAL LOW (ref 36.0–46.0)
Hemoglobin: 10.9 g/dL — ABNORMAL LOW (ref 12.0–15.0)
Lymphocytes Relative: 11 %
Lymphs Abs: 0.4 10*3/uL — ABNORMAL LOW (ref 0.7–4.0)
MCH: 30 pg (ref 26.0–34.0)
MCHC: 31.6 g/dL (ref 30.0–36.0)
MCV: 95 fL (ref 78.0–100.0)
Monocytes Absolute: 0.5 10*3/uL (ref 0.1–1.0)
Monocytes Relative: 12 %
Neutro Abs: 3 10*3/uL (ref 1.7–7.7)
Neutrophils Relative %: 77 %
Platelets: 153 10*3/uL (ref 150–400)
RBC: 3.63 MIL/uL — ABNORMAL LOW (ref 3.87–5.11)
RDW: 13.8 % (ref 11.5–15.5)
WBC: 3.9 10*3/uL — ABNORMAL LOW (ref 4.0–10.5)

## 2015-08-27 LAB — COMPREHENSIVE METABOLIC PANEL
ALT: 21 U/L (ref 14–54)
AST: 23 U/L (ref 15–41)
Albumin: 2.7 g/dL — ABNORMAL LOW (ref 3.5–5.0)
Alkaline Phosphatase: 50 U/L (ref 38–126)
Anion gap: 9 (ref 5–15)
BUN: 11 mg/dL (ref 6–20)
CO2: 26 mmol/L (ref 22–32)
Calcium: 8.2 mg/dL — ABNORMAL LOW (ref 8.9–10.3)
Chloride: 102 mmol/L (ref 101–111)
Creatinine, Ser: 0.6 mg/dL (ref 0.44–1.00)
GFR calc Af Amer: >60 mL/min (ref >60)
GFR calc non Af Amer: >60 mL/min (ref >60)
Glucose, Bld: 99 mg/dL (ref 65–99)
Potassium: 3.7 mmol/L (ref 3.5–5.1)
Sodium: 137 mmol/L (ref 135–145)
Total Bilirubin: 1.1 mg/dL (ref 0.3–1.2)
Total Protein: 5.6 g/dL — ABNORMAL LOW (ref 6.5–8.1)

## 2015-08-27 LAB — GLUCOSE, CAPILLARY: Glucose-Capillary: 102 mg/dL — ABNORMAL HIGH (ref 65–99)

## 2015-08-27 MED ORDER — BENZONATATE 100 MG PO CAPS: 200.0000 mg | ORAL_CAPSULE | Freq: Three times a day (TID) | ORAL | Status: DC | PRN

## 2015-08-27 MED ORDER — LOPERAMIDE HCL 2 MG PO CAPS
2.0000 mg | ORAL_CAPSULE | ORAL | Status: DC | PRN
  Administered 2015-08-28: 2 mg via ORAL
  Filled 2015-08-27: qty 1

## 2015-08-27 MED ORDER — LOPERAMIDE HCL 2 MG PO CAPS
4.0000 mg | ORAL_CAPSULE | Freq: Once | ORAL | Status: AC
  Administered 2015-08-27: 4 mg via ORAL
  Filled 2015-08-27: qty 2

## 2015-08-27 MED ORDER — HYDROCOD POLST-CPM POLST ER 10-8 MG/5ML PO SUER: 5.0000 mL | Freq: Two times a day (BID) | ORAL | Status: DC | PRN

## 2015-08-27 NOTE — Progress Notes (Signed)
[facility] PROGRESS NOTE  GAL SMOLINSKI VOZ:[id number] DOB: March 23, 1946 DOA: 08/24/2015 PCP: [name], MD  Summary 08/25/15: I have seen and examined Ms Valerie [name] at bedside in the presence of her husband, reviewed her chart and discussed with Dr. Earlie Server over the phone. Valerie [name] is a pleasant 70 y.o. female with a history of Brain Cancer dx in 2007 S/P Radiation Rx and on Experimental Chemo RX who presented to the ED with complaints of Increased SOB and Cough and chest congestion x for 3 days associated with fever and she was found to have Influenza Type A and possibly healthcare associated pneumonia/aspiration pneumonitis with CT chest abdomen and pelvis showing "1. Post left upper lobectomy with postsurgical and post treatment related change in the left lung. Adjacent to the left hilum is new ill-defined and ground-glass opacities. There is questionable intraluminal debris in the left bronchus. Aspiration is considered versus infectious pneumonia. Recurent disease is not entirely excluded. 2. Small left pleural effusion with minimal increase from prior. 3. No acute abnormality in the abdomen/pelvis. Stable chronic findings as described". Blood culture shows no growth to date. Will therefore continue Zosyn, discontinue vancomycin, give Mucinex and Tamiflu. Will hold chemotherapy for now until patient gets discharged. 08/26/15: Blood cultures remain negative. Had diarrhea today? Antibiotic induced-?C diff. Will check C diff pcr and continue current management, give antispasmodics if C.diff negative and diarrhea persists. 08/27/15: C. difficile PCR negative. Still coughing. Doubt that patient has sepsis. Her symptoms may all be related to influenza. We'll therefore discontinue Zosyn and continue Tamiflu to complete course. Meanwhile, will add Tussionex/Imodium. Plan SIRS (systemic inflammatory response syndrome) (HCC)/HCAP (healthcare-associated pneumonia)/Influenza  A/Cough  Discontinue Zosyn  Day 3 Tamiflu  Tussionex/Tessalon Perles Malignant neoplasm of brain (HCC)/Dementia/Chronic diarrhea  Imodium  Resume chemotherapy after discharge  Code Status: Full Code Family Communication: Discussed with husband at bedside on 08/25/15 Disposition Plan: Home Eventually   Consultants:  Dr. Earlie Server over the phone  Procedures:  None  Antibiotics:  Vancomycin 08/24/2015>08/25/2015  Zosyn 08/24/2015>  Tamiflu 08/25/2015>  HPI/Subjective: Feels about the same.  Objective: Filed Vitals:   08/27/15 0440 08/27/15 0948  BP:  117/60  Pulse:  78  Temp: 98.4 F (36.9 C) 98.4 F (36.9 C)  Resp: 18 20    Intake/Output Summary (Last 24 hours) at 08/27/15 1623 Last data filed at 08/27/15 1500  Gross per 24 hour  Intake   2630 ml  Output      0 ml  Net   2630 ml   Filed Weights   08/24/15 2206 08/25/15 0051  Weight: 84.823 kg (187 lb) 84.5 kg (186 lb 4.6 oz)    Exam:   General:  Coughing.  Cardiovascular: S1-S2 normal. No murmurs. Pulse regular.  Respiratory: Good air entry bilaterally. No rhonchi or rales.  Abdomen: Soft and nontender. Normal bowel sounds. No organomegaly.  Musculoskeletal: No pedal edema   Neurological: Intact  Data Reviewed: Basic Metabolic Panel:  Recent Labs Lab 08/24/15 1641 08/25/15 0428 08/26/15 0405 08/27/15 0553  NA 139 137 138 137  K 3.9 3.7 3.7 3.7  CL 103 104 103 102  CO2 '26 25 26 26  '$ GLUCOSE 148* 101* 114* 99  BUN '15 14 14 11  '$ CREATININE 0.80 0.69 0.65 0.60  CALCIUM 9.5 8.9 8.4* 8.2*   Liver Function Tests:  Recent Labs Lab 08/24/15 1641 08/27/15 0553  AST 19 23  ALT 16 21  ALKPHOS 58 50  BILITOT 0.7 1.1  PROT 7.5 5.6*  ALBUMIN  4.5 2.7*   No results for input(s): LIPASE, AMYLASE in the last 168 hours. No results for input(s): AMMONIA in the last 168 hours. CBC:  Recent Labs Lab 08/24/15 1641 08/25/15 0428 08/26/15 0405 08/27/15 0553  WBC 7.5 6.2 4.6 3.9*   NEUTROABS 6.9  --   --  3.0  HGB 12.2 11.8* 11.1* 10.9*  HCT 38.6 36.6 35.1* 34.5*  MCV 92.8 93.4 95.6 95.0  PLT 171 135* 146* 153   Cardiac Enzymes:  Recent Labs Lab 08/24/15 1641  TROPONINI <0.03   BNP (last 3 results)  Recent Labs  08/24/15 1641  BNP 254.3*    ProBNP (last 3 results) No results for input(s): PROBNP in the last 8760 hours.  CBG:  Recent Labs Lab 08/26/15 2140  GLUCAP 102*    Recent Results (from the past 240 hour(s))  Culture, blood (routine x 2)     Status: None (Preliminary result)   Collection Time: 08/24/15  4:41 PM  Result Value Ref Range Status   Specimen Description BLOOD RIGHT ANTECUBITAL  Final   Special Requests BOTTLES DRAWN AEROBIC AND ANAEROBIC 5CC EACH  Final   Culture   Final    NO GROWTH 3 DAYS Performed at [facility]    Report Status PENDING  Incomplete  Culture, blood (routine x 2)     Status: None (Preliminary result)   Collection Time: 08/25/15  4:28 AM  Result Value Ref Range Status   Specimen Description BLOOD LEFT ARM  Final   Special Requests IN PEDIATRIC BOTTLE [place]  Final   Culture   Final    NO GROWTH 2 DAYS Performed at [facility]    Report Status PENDING  Incomplete  C difficile quick scan w PCR reflex     Status: None   Collection Time: 08/26/15 10:37 PM  Result Value Ref Range Status   C Diff antigen NEGATIVE NEGATIVE Final   C Diff toxin NEGATIVE NEGATIVE Final   C Diff interpretation Negative for toxigenic C. difficile  Final     Studies: No results found.  Scheduled Meds: . antiseptic oral rinse  7 mL Mouth Rinse q12n4p  . chlorhexidine  15 mL Mouth Rinse BID  . cholecalciferol  1,000 Units Oral QHS  . donepezil  10 mg Oral Daily  . multivitamin with minerals  1 tablet Oral Daily  . oseltamivir  75 mg Oral BID  . pantoprazole  40 mg Oral Q1200  . piperacillin-tazobactam (ZOSYN)  IV  3.375 g Intravenous Q8H  . rivaroxaban  20 mg Oral QHS  . sodium chloride  3 mL Intravenous  Q12H   Continuous Infusions: . sodium chloride 125 mL/hr at 08/24/15 1949  . sodium chloride 75 mL/hr at 08/27/15 1333     Time spent: 15 minutes    RANGA,SIMBISO  [facility] Pager [phone]. If 7PM-7AM, please contact night-coverage at [email or web address], password [facility] 08/27/2015, 4:23 PM  LOS: 3 days

## 2015-08-28 LAB — BASIC METABOLIC PANEL
Anion gap: 11 (ref 5–15)
BUN: 8 mg/dL (ref 6–20)
CO2: 25 mmol/L (ref 22–32)
Calcium: 8.6 mg/dL — ABNORMAL LOW (ref 8.9–10.3)
Chloride: 103 mmol/L (ref 101–111)
Creatinine, Ser: 0.55 mg/dL (ref 0.44–1.00)
GFR calc Af Amer: >60 mL/min (ref >60)
GFR calc non Af Amer: >60 mL/min (ref >60)
Glucose, Bld: 100 mg/dL — ABNORMAL HIGH (ref 65–99)
Potassium: 3.1 mmol/L — ABNORMAL LOW (ref 3.5–5.1)
Sodium: 139 mmol/L (ref 135–145)

## 2015-08-28 LAB — CBC
HCT: 34.5 % — ABNORMAL LOW (ref 36.0–46.0)
Hemoglobin: 10.9 g/dL — ABNORMAL LOW (ref 12.0–15.0)
MCH: 29.5 pg (ref 26.0–34.0)
MCHC: 31.6 g/dL (ref 30.0–36.0)
MCV: 93.5 fL (ref 78.0–100.0)
Platelets: 170 10*3/uL (ref 150–400)
RBC: 3.69 MIL/uL — ABNORMAL LOW (ref 3.87–5.11)
RDW: 13.9 % (ref 11.5–15.5)
WBC: 3.7 10*3/uL — ABNORMAL LOW (ref 4.0–10.5)

## 2015-08-28 MED ORDER — POTASSIUM CHLORIDE 20 MEQ/15ML (10%) PO SOLN
40.0000 meq | ORAL | Status: AC
  Administered 2015-08-28 (×2): 40 meq via ORAL
  Filled 2015-08-28 (×2): qty 30

## 2015-08-28 NOTE — Evaluation (Signed)
Physical Therapy Evaluation Patient Details Name: Valerie [name] MRN: [id number] DOB: 17-Oct-1945 Today's Date: 08/28/2015   History of Present Illness  70 yo female admitted with SIRS, Pna, + flu. hx of brain cancer, lung cancer, DVT, dementia?, fall-R proximal fibula fx, L5 compression fx, ankle fx (L), osteoporosis, HTN.   Clinical Impression  On eval, pt required Mod assist for bed mobility, Min assist to stand, ambulate ~10 feet with RW. Pt is weak and at risk for falls. Will require RW use. Highly recommend 24 hour supervision/assist. Discussed d/c plan-pt states her husband can provide care at home.     Follow Up Recommendations Home health PT;Supervision/Assistance - 24 hour    Equipment Recommendations  None recommended by PT    Recommendations for Other Services       Precautions / Restrictions Precautions Precautions: Fall Restrictions Weight Bearing Restrictions: No      Mobility  Bed Mobility Overal bed mobility: Needs Assistance Bed Mobility: Supine to Sit     Supine to sit: Mod assist;HOB elevated     General bed mobility comments: Assist for trunk to upright. Increased time. [place] on bedrail  Transfers Overall transfer level: Needs assistance Equipment used: Rolling walker (2 wheeled) Transfers: Sit to/from [facility] Sit to Stand: Min assist;From elevated surface Stand pivot transfers: Min assist       General transfer comment: Assist to rise, stabilize, control descent. VCs safety, hand placement. Stand pivot, bed to recliner, with RW. Increased time.   Ambulation/Gait Ambulation/Gait assistance: Min assist Ambulation Distance (Feet): 10 Feet Assistive device: Rolling walker (2 wheeled) Gait Pattern/deviations: Step-through pattern;Trunk flexed;Decreased stride length     General Gait Details: Assist to stabilize and maneuver with RW. Pt fatigues quickly/easily.   Stairs            Wheelchair Mobility     Modified Rankin (Stroke Patients Only)       Balance Overall balance assessment: Needs assistance         Standing balance support: Bilateral upper extremity supported;During functional activity Standing balance-Leahy Scale: Poor Standing balance comment: requiring RW                             Pertinent Vitals/Pain Pain Assessment: No/denies pain    Home Living Family/patient expects to be discharged to:: Private residence Living Arrangements: Spouse/significant other Available Help at Discharge: Family Type of Home: House       Home Layout: Two level;Able to live on main level with bedroom/bathroom Home Equipment: Walker - 4 wheels;Toilet riser      Prior Function                 Hand Dominance        Extremity/Trunk Assessment   Upper Extremity Assessment: Generalized weakness           Lower Extremity Assessment: Generalized weakness      Cervical / Trunk Assessment: Kyphotic  Communication   Communication: No difficulties  Cognition Arousal/Alertness: Awake/alert Behavior During Therapy: WFL for tasks assessed/performed Overall Cognitive Status: Within Functional Limits for tasks assessed                      General Comments      Exercises        Assessment/Plan    PT Assessment Patient needs continued PT services  PT Diagnosis Difficulty walking;Generalized weakness   PT Problem List Decreased strength;Decreased activity tolerance;Decreased  balance;Decreased mobility;Decreased knowledge of use of DME  PT Treatment Interventions DME instruction;Functional mobility training;Therapeutic activities;Patient/family education;Gait training;Therapeutic exercise;Balance training   PT Goals (Current goals can be found in the Care Plan section) Acute Rehab PT Goals Patient Stated Goal: to get home PT Goal Formulation: With patient Time For Goal Achievement: 09/11/15 Potential to Achieve Goals: Good    Frequency  Min 3X/week   Barriers to discharge        Co-evaluation               End of Session   Activity Tolerance: Patient limited by fatigue Patient left: in chair;with call bell/phone within reach;with chair alarm set           Time: 1458-1511 PT Time Calculation (min) (ACUTE ONLY): 13 min   Charges:   PT Evaluation $PT Eval Moderate Complexity: 1 Procedure     PT G Codes:        Weston Anna, MPT Pager: [phone]

## 2015-08-28 NOTE — Progress Notes (Signed)
[facility] PROGRESS NOTE  Valerie [name] WOE:[id number] DOB: [birth year] DOA: 08/24/2015 PCP: [name], MD  Summary 08/25/15: I have seen and examined Valerie [name] at bedside in the presence of her husband, reviewed her chart and discussed with Dr. Earlie Server over the phone. Valerie [name] is a pleasant 70 y.o. female with a history of Brain Cancer dx in 2007 S/P Radiation Rx and on Experimental Chemo RX who presented to the ED with complaints of Increased SOB and Cough and chest congestion x for 3 days associated with fever and she was found to have Influenza Type A and possibly healthcare associated pneumonia/aspiration pneumonitis with CT chest abdomen and pelvis showing "1. Post left upper lobectomy with postsurgical and post treatment related change in the left lung. Adjacent to the left hilum is new ill-defined and ground-glass opacities. There is questionable intraluminal debris in the left bronchus. Aspiration is considered versus infectious pneumonia. Recurent disease is not entirely excluded. 2. Small left pleural effusion with minimal increase from prior. 3. No acute abnormality in the abdomen/pelvis. Stable chronic findings as described". Blood culture shows no growth to date. Will therefore continue Zosyn, discontinue vancomycin, give Mucinex and Tamiflu. Will hold chemotherapy for now until patient gets discharged. 08/26/15: Blood cultures remain negative. Had diarrhea today? Antibiotic induced-?C diff. Will check C diff pcr and continue current management, give antispasmodics if C.diff negative and diarrhea persists. 08/27/15: C. difficile PCR negative. Still coughing. Doubt that patient has sepsis. Her symptoms may all be related to influenza. We'll therefore discontinue Zosyn and continue Tamiflu to complete course. Meanwhile, will add Tussionex/Imodium. 08/28/15: Diarrhea and cough improved. Worked with physical therapy. Appreciate physical therapy help. Continue current  management. Plan SIRS (systemic inflammatory response syndrome) (HCC)/HCAP (healthcare-associated pneumonia)/Influenza A/Cough  Day 4 Tamiflu  Tussionex/Tessalon Perles Malignant neoplasm of brain (HCC)/Dementia/Chronic diarrhea  Imodium  Resume chemotherapy after discharge  Code Status: Full Code Family Communication: Discussed with Dr. at bedside Disposition Plan: Home Eventually   Consultants:  Dr. Earlie Server over the phone  Procedures:  None  Antibiotics:  Vancomycin 08/24/2015>08/25/2015  Zosyn 08/24/2015> 08/27/2015  Tamiflu 08/25/2015>  HPI/Subjective: Feels better. Wants to ambulate more.  Objective: Filed Vitals:   08/28/15 0342 08/28/15 1344  BP: 137/58 131/57  Pulse: 83 79  Temp: 98.5 F (36.9 C) 98.7 F (37.1 C)  Resp: 20 20    Intake/Output Summary (Last 24 hours) at 08/28/15 2019 Last data filed at 08/28/15 0900  Gross per 24 hour  Intake    240 ml  Output      0 ml  Net    240 ml   Filed Weights   08/24/15 2206 08/25/15 0051  Weight: 84.823 kg (187 lb) 84.5 kg (186 lb 4.6 oz)    Exam:   General:  Comfortable at rest.  Cardiovascular: S1-S2 normal. No murmurs. Pulse regular.  Respiratory: Good air entry bilaterally. No rhonchi or rales.  Abdomen: Soft and nontender. Normal bowel sounds. No organomegaly.  Musculoskeletal: No pedal edema   Neurological: Intact  Data Reviewed: Basic Metabolic Panel:  Recent Labs Lab 08/24/15 1641 08/25/15 0428 08/26/15 0405 08/27/15 0553 08/28/15 0518  NA 139 137 138 137 139  K 3.9 3.7 3.7 3.7 3.1*  CL 103 104 103 102 103  CO2 '26 25 26 26 25  '$ GLUCOSE 148* 101* 114* 99 100*  BUN '15 14 14 11 8  '$ CREATININE 0.80 0.69 0.65 0.60 0.55  CALCIUM 9.5 8.9 8.4* 8.2* 8.6*   Liver Function Tests:  Recent  Labs Lab 08/24/15 1641 08/27/15 0553  AST 19 23  ALT 16 21  ALKPHOS 58 50  BILITOT 0.7 1.1  PROT 7.5 5.6*  ALBUMIN 4.5 2.7*   No results for input(s): LIPASE, AMYLASE in the last  168 hours. No results for input(s): AMMONIA in the last 168 hours. CBC:  Recent Labs Lab 08/24/15 1641 08/25/15 0428 08/26/15 0405 08/27/15 0553 08/28/15 0518  WBC 7.5 6.2 4.6 3.9* 3.7*  NEUTROABS 6.9  --   --  3.0  --   HGB 12.2 11.8* 11.1* 10.9* 10.9*  HCT 38.6 36.6 35.1* 34.5* 34.5*  MCV 92.8 93.4 95.6 95.0 93.5  PLT 171 135* 146* 153 170   Cardiac Enzymes:  Recent Labs Lab 08/24/15 1641  TROPONINI <0.03   BNP (last 3 results)  Recent Labs  08/24/15 1641  BNP 254.3*    ProBNP (last 3 results) No results for input(s): PROBNP in the last 8760 hours.  CBG:  Recent Labs Lab 08/26/15 2140  GLUCAP 102*    Recent Results (from the past 240 hour(s))  Culture, blood (routine x 2)     Status: None (Preliminary result)   Collection Time: 08/24/15  4:41 PM  Result Value Ref Range Status   Specimen Description BLOOD RIGHT ANTECUBITAL  Final   Special Requests BOTTLES DRAWN AEROBIC AND ANAEROBIC 5CC EACH  Final   Culture   Final    NO GROWTH 4 DAYS Performed at [facility]    Report Status PENDING  Incomplete  Culture, blood (routine x 2)     Status: None (Preliminary result)   Collection Time: 08/25/15  4:28 AM  Result Value Ref Range Status   Specimen Description BLOOD LEFT ARM  Final   Special Requests IN PEDIATRIC BOTTLE 1CC  Final   Culture   Final    NO GROWTH 3 DAYS Performed at [facility]    Report Status PENDING  Incomplete  C difficile quick scan w PCR reflex     Status: None   Collection Time: 08/26/15 10:37 PM  Result Value Ref Range Status   C Diff antigen NEGATIVE NEGATIVE Final   C Diff toxin NEGATIVE NEGATIVE Final   C Diff interpretation Negative for toxigenic C. difficile  Final     Studies: No results found.  Scheduled Meds: . antiseptic oral rinse  7 mL Mouth Rinse q12n4p  . chlorhexidine  15 mL Mouth Rinse BID  . cholecalciferol  1,000 Units Oral QHS  . donepezil  10 mg Oral Daily  . multivitamin with minerals   1 tablet Oral Daily  . oseltamivir  75 mg Oral BID  . pantoprazole  40 mg Oral Q1200  . potassium chloride  40 mEq Oral Q4H  . rivaroxaban  20 mg Oral QHS  . sodium chloride  3 mL Intravenous Q12H   Continuous Infusions:    Time spent: 15 minutes    RANGA,SIMBISO  [facility] Pager [phone]. If 7PM-7AM, please contact night-coverage at [email or web address], password [facility] 08/28/2015, 8:19 PM  LOS: 4 days

## 2015-08-29 LAB — BASIC METABOLIC PANEL
Anion gap: 7 (ref 5–15)
BUN: 9 mg/dL (ref 6–20)
CO2: 28 mmol/L (ref 22–32)
Calcium: 8.8 mg/dL — ABNORMAL LOW (ref 8.9–10.3)
Chloride: 102 mmol/L (ref 101–111)
Creatinine, Ser: 0.49 mg/dL (ref 0.44–1.00)
GFR calc Af Amer: >60 mL/min (ref >60)
GFR calc non Af Amer: >60 mL/min (ref >60)
Glucose, Bld: 112 mg/dL — ABNORMAL HIGH (ref 65–99)
Potassium: 3.6 mmol/L (ref 3.5–5.1)
Sodium: 137 mmol/L (ref 135–145)

## 2015-08-29 LAB — CBC
HCT: 34 % — ABNORMAL LOW (ref 36.0–46.0)
Hemoglobin: 11.1 g/dL — ABNORMAL LOW (ref 12.0–15.0)
MCH: 30.3 pg (ref 26.0–34.0)
MCHC: 32.6 g/dL (ref 30.0–36.0)
MCV: 92.9 fL (ref 78.0–100.0)
Platelets: 183 10*3/uL (ref 150–400)
RBC: 3.66 MIL/uL — ABNORMAL LOW (ref 3.87–5.11)
RDW: 14.1 % (ref 11.5–15.5)
WBC: 4.2 10*3/uL (ref 4.0–10.5)

## 2015-08-29 LAB — MAGNESIUM: Magnesium: 2.1 mg/dL (ref 1.7–2.4)

## 2015-08-29 LAB — CULTURE, BLOOD (ROUTINE X 2): Culture: NO GROWTH

## 2015-08-29 NOTE — Progress Notes (Signed)
[facility] PROGRESS NOTE  Valerie [name] HCW:[id number] DOB: [birth year] DOA: 08/24/2015 PCP: [name], MD  Summary 08/25/15: I have seen and examined Valerie [name] at bedside in the presence of her husband, reviewed her chart and discussed with Dr. Earlie Server over the phone. Valerie [name] is a pleasant 70 y.o. female with a history of Brain Cancer dx in 2007 S/P Radiation Rx and on Experimental Chemo RX who presented to the ED with complaints of Increased SOB and Cough and chest congestion x for 3 days associated with fever and she was found to have Influenza Type A and possibly healthcare associated pneumonia/aspiration pneumonitis with CT chest abdomen and pelvis showing "1. Post left upper lobectomy with postsurgical and post treatment related change in the left lung. Adjacent to the left hilum is new ill-defined and ground-glass opacities. There is questionable intraluminal debris in the left bronchus. Aspiration is considered versus infectious pneumonia. Recurent disease is not entirely excluded. 2. Small left pleural effusion with minimal increase from prior. 3. No acute abnormality in the abdomen/pelvis. Stable chronic findings as described". Blood culture shows no growth to date. Will therefore continue Zosyn, discontinue vancomycin, give Mucinex and Tamiflu. Will hold chemotherapy for now until patient gets discharged. 08/26/15: Blood cultures remain negative. Had diarrhea today? Antibiotic induced-?C diff. Will check C diff pcr and continue current management, give antispasmodics if C.diff negative and diarrhea persists. 08/27/15: C. difficile PCR negative. Still coughing. Doubt that patient has sepsis. Her symptoms may all be related to influenza. We'll therefore discontinue Zosyn and continue Tamiflu to complete course. Meanwhile, will add Tussionex/Imodium. 08/28/15: Diarrhea and cough improved. Worked with physical therapy. Appreciate physical therapy help. Continue current  management. 08/29/15: More mobile, less cough, diarrhea gone. Likely d/c in am. Plan SIRS (systemic inflammatory response syndrome) (HCC)/HCAP (healthcare-associated pneumonia)/Influenza A/Cough  Day 5 Tamiflu  Tussionex/Tessalon Perles Malignant neoplasm of brain (HCC)/Dementia/Chronic diarrhea  Imodium  Resume chemotherapy after discharge  Code Status: Full Code Family Communication: Discussed with Dr. at bedside Disposition Plan: Home Eventually   Consultants:  Dr. Earlie Server over the phone  Procedures:  None  Antibiotics:  Vancomycin 08/24/2015>08/25/2015  Zosyn 08/24/2015> 08/27/2015  Tamiflu 08/25/2015>  HPI/Subjective: Feels great. Ambulated to bathroom without difficulty.  Objective: Filed Vitals:   08/29/15 1434 08/29/15 2119  BP: 125/73 110/59  Pulse: 77 91  Temp: 98 F (36.7 C) 98.8 F (37.1 C)  Resp: 18 18    Intake/Output Summary (Last 24 hours) at 08/29/15 2353 Last data filed at 08/29/15 1938  Gross per 24 hour  Intake    840 ml  Output      0 ml  Net    840 ml   Filed Weights   08/24/15 2206 08/25/15 0051  Weight: 84.823 kg (187 lb) 84.5 kg (186 lb 4.6 oz)    Exam:   General:  Comfortable at rest.  Cardiovascular: S1-S2 normal. No murmurs. Pulse regular.  Respiratory: Good air entry bilaterally. No rhonchi or rales.  Abdomen: Soft and nontender. Normal bowel sounds. No organomegaly.  Musculoskeletal: No pedal edema   Neurological: Intact  Data Reviewed: Basic Metabolic Panel:  Recent Labs Lab 08/25/15 0428 08/26/15 0405 08/27/15 0553 08/28/15 0518 08/29/15 0505  NA 137 138 137 139 137  K 3.7 3.7 3.7 3.1* 3.6  CL 104 103 102 103 102  CO2 '25 26 26 25 28  '$ GLUCOSE 101* 114* 99 100* 112*  BUN '14 14 11 8 9  '$ CREATININE 0.69 0.65 0.60 0.55 0.49  CALCIUM  8.9 8.4* 8.2* 8.6* 8.8*  MG  --   --   --   --  2.1   Liver Function Tests:  Recent Labs Lab 08/24/15 1641 08/27/15 0553  AST 19 23  ALT 16 21  ALKPHOS 58 50   BILITOT 0.7 1.1  PROT 7.5 5.6*  ALBUMIN 4.5 2.7*   No results for input(s): LIPASE, AMYLASE in the last 168 hours. No results for input(s): AMMONIA in the last 168 hours. CBC:  Recent Labs Lab 08/24/15 1641 08/25/15 0428 08/26/15 0405 08/27/15 0553 08/28/15 0518 08/29/15 0505  WBC 7.5 6.2 4.6 3.9* 3.7* 4.2  NEUTROABS 6.9  --   --  3.0  --   --   HGB 12.2 11.8* 11.1* 10.9* 10.9* 11.1*  HCT 38.6 36.6 35.1* 34.5* 34.5* 34.0*  MCV 92.8 93.4 95.6 95.0 93.5 92.9  PLT 171 135* 146* 153 170 183   Cardiac Enzymes:  Recent Labs Lab 08/24/15 1641  TROPONINI <0.03   BNP (last 3 results)  Recent Labs  08/24/15 1641  BNP 254.3*    ProBNP (last 3 results) No results for input(s): PROBNP in the last 8760 hours.  CBG:  Recent Labs Lab 08/26/15 2140  GLUCAP 102*    Recent Results (from the past 240 hour(s))  Culture, blood (routine x 2)     Status: None   Collection Time: 08/24/15  4:41 PM  Result Value Ref Range Status   Specimen Description BLOOD RIGHT ANTECUBITAL  Final   Special Requests BOTTLES DRAWN AEROBIC AND ANAEROBIC 5CC EACH  Final   Culture   Final    NO GROWTH 5 DAYS Performed at [facility]    Report Status 08/29/2015 FINAL  Final  Culture, blood (routine x 2)     Status: None (Preliminary result)   Collection Time: 08/25/15  4:28 AM  Result Value Ref Range Status   Specimen Description BLOOD LEFT ARM  Final   Special Requests IN PEDIATRIC BOTTLE [place]  Final   Culture   Final    NO GROWTH 4 DAYS Performed at [facility]    Report Status PENDING  Incomplete  C difficile quick scan w PCR reflex     Status: None   Collection Time: 08/26/15 10:37 PM  Result Value Ref Range Status   C Diff antigen NEGATIVE NEGATIVE Final   C Diff toxin NEGATIVE NEGATIVE Final   C Diff interpretation Negative for toxigenic C. difficile  Final     Studies: No results found.  Scheduled Meds: . antiseptic oral rinse  7 mL Mouth Rinse q12n4p  .  chlorhexidine  15 mL Mouth Rinse BID  . cholecalciferol  1,000 Units Oral QHS  . donepezil  10 mg Oral Daily  . multivitamin with minerals  1 tablet Oral Daily  . pantoprazole  40 mg Oral Q1200  . rivaroxaban  20 mg Oral QHS  . sodium chloride  3 mL Intravenous Q12H   Continuous Infusions:    Time spent: 10 minutes    RANGA,SIMBISO  [facility] Pager [phone]. If 7PM-7AM, please contact night-coverage at [email or web address], password [facility] 08/29/2015, 11:53 PM  LOS: 5 days

## 2015-08-30 LAB — CULTURE, BLOOD (ROUTINE X 2): Culture: NO GROWTH

## 2015-08-30 MED ORDER — PANTOPRAZOLE SODIUM 40 MG PO TBEC: 40.0000 mg | DELAYED_RELEASE_TABLET | Freq: Every day | ORAL | Status: DC

## 2015-08-30 MED ORDER — HYDROCOD POLST-CPM POLST ER 10-8 MG/5ML PO SUER: 5.0000 mL | Freq: Two times a day (BID) | ORAL | Status: DC | PRN

## 2015-08-30 NOTE — Progress Notes (Signed)
PT Cancellation Note  Patient Details Name: Valerie [name] MRN: [id number] DOB: June 22, 1946   Cancelled Treatment:    Reason Eval/Treat Not Completed: Other (comment) Pt to d/c home today.  Pt declines any PT needs at this time.   LEMYRE,KATHrine E 08/30/2015, 10:52 AM Carmelia Bake, PT, DPT 08/30/2015 Pager: [phone]

## 2015-08-30 NOTE — Care Management Important Message (Signed)
Important Message  Patient Details IM Letter given to Cookie/Case Manager to present to Patient Name: Valerie [name] MRN: [id number] Date of Birth: 05/09/1946   Medicare Important Message Given:  Yes    Camillo Flaming 08/30/2015, 10:54 AM

## 2015-08-30 NOTE — Discharge Summary (Signed)
Valerie [name], is a 70 y.o. female  DOB [birth year]  MRN [id number].  Admission date:  08/24/2015  Admitting Physician  Theressa Millard, MD  Discharge Date:  08/30/2015   Primary MD  [name], MD  Recommendations for primary care physician for things to follow:  Follow complete resolution of influenza, ?empiric antibiotics if respiratory symptoms persistent.   Admission Diagnosis   Cough [R05] Sepsis ([place]) [A41.9] Other specified fever [R50.9] Malignant neoplasm of left lung, unspecified part of lung ([place]) [C34.92]   Discharge Diagnosis  Cough [R05] Sepsis ([place]) [A41.9] Other specified fever [R50.9] Malignant neoplasm of left lung, unspecified part of lung ([place]) [C34.92]   Active Problems:   Malignant neoplasm of brain ([place])   Dementia   Chronic diarrhea  Summary Valerie [name] is a pleasant 70 year old female admitted with increasing shortness of breath and cough and she was found to have influenza type A with concern for healthcare associated pneumonia. Her blood cultures were normal. She was initially placed on broad-spectrum antibiotics which were discontinued after influenza swab results. She has completed 5 day course of Tamiflu and feels close to her baseline and will therefore discharge home to follow with her primary care providers, including oncology for brain cancer management. She should resume her home medications and follow with oncology as scheduled. Please refer to daily progress notes below for details of patient's hospital stay, including studies.     Hospital Course  08/25/15: I have seen and examined Valerie [name] at bedside in the presence of her husband, reviewed her chart and discussed with Dr. Earlie Server over the phone. Valerie [name] is a pleasant 70 y.o. female with a  history of Brain Cancer dx in 2007 S/P Radiation Rx and on Experimental Chemo RX who presented to the ED with complaints of Increased SOB and Cough and chest congestion x for 3 days associated with fever and she was found to have Influenza Type A and possibly healthcare associated pneumonia/aspiration pneumonitis with CT chest abdomen and pelvis showing "1. Post left upper lobectomy with postsurgical and post treatment related change in the left lung. Adjacent to the left hilum is new ill-defined and ground-glass opacities. There is questionable intraluminal debris in the left bronchus. Aspiration is considered versus infectious pneumonia. Recurent disease is not entirely excluded. 2. Small left pleural effusion with minimal increase from prior. 3. No acute abnormality in the abdomen/pelvis. Stable chronic findings as described". Blood culture shows no growth to date. Will therefore continue Zosyn, discontinue vancomycin, give Mucinex and Tamiflu. Will hold chemotherapy for now until patient gets discharged. 08/26/15: Blood cultures remain negative. Had diarrhea today? Antibiotic induced-?C diff. Will check C diff pcr and continue current management, give antispasmodics if C.diff negative and diarrhea persists. 08/27/15: C. difficile PCR negative. Still coughing. Doubt that patient has sepsis. Her symptoms may all be related to influenza. We'll therefore discontinue Zosyn and continue Tamiflu to complete course. Meanwhile, will add Tussionex/Imodium. 08/28/15: Diarrhea and cough improved. Worked with physical therapy. Appreciate physical therapy help. Continue  current management. 08/29/15: More mobile, less cough, diarrhea gone. Likely d/c in am. 08/30/15: Feels great. No complaints.   Discharge Condition Stable.  Consults obtained  Dr. Earlie Server over the phone  Follow [facility] On[facility]   Discharge Instructions  and  Discharge Medications  Discharge Instructions    Diet - low sodium heart healthy    Complete by:   As directed      Increase activity slowly    Complete by:  As directed             Medication List    TAKE these medications        alendronate 70 MG tablet  Commonly known as:  FOSAMAX  Take 1 tablet (70 mg total) by mouth every 7 (seven) days. Saturdays; Take with a full glass of water on an empty stomach.     chlorpheniramine-HYDROcodone 10-8 MG/5ML [place]  Commonly known as:  TUSSIONEX  Take 5 mLs by mouth every 12 (twelve) hours as needed for cough.     cholecalciferol 1000 units tablet  Commonly known as:  VITAMIN D  Take 1,000 Units by mouth at bedtime.     donepezil 10 MG tablet  Commonly known as:  ARICEPT  Take 10 mg by mouth daily.     loperamide 2 MG capsule  Commonly known as:  IMODIUM  Take 2 mg by mouth as needed for diarrhea or loose stools.     MULTI-VITAMINS Tabs  Take 1 tablet by mouth daily.     naproxen sodium 220 MG tablet  Commonly known as:  ANAPROX  Take 220 mg by mouth 2 (two) times daily as needed (pain).     pantoprazole 40 MG tablet  Commonly known as:  PROTONIX  Take 1 tablet (40 mg total) by mouth daily at 12 noon.     research study medication  Take home supply.     UNABLE TO FIND  Med Name: Wig Dx primary Cancer of right upper lobe of lung     vitamin C 100 MG tablet  Take 100 mg by mouth daily.     XARELTO 20 MG Tabs tablet  Generic drug:  rivaroxaban  Take 1 tablet (20 mg total) by mouth daily.        Diet and Activity recommendation: See Discharge Instructions above  Major procedures and Radiology Reports - PLEASE review detailed and final reports for all details, in brief -    Dg Chest 2 View  08/24/2015  CLINICAL DATA:  Shortness of breath, productive cough. EXAM: CHEST  2 VIEW COMPARISON:  September 30, 2014. FINDINGS: Stable cardiomegaly is noted. Postsurgical changes are again identified in the left hilum. No pneumothorax is noted. There is again noted elevated left hemidiaphragm posteriorly. Patient is rotated to  the left. No acute pulmonary disease is noted. No definite pleural effusion is noted. IMPRESSION: Stable postsurgical changes as described above. No acute abnormality seen. Electronically Signed   By: Marijo Conception, M.D.   On: 08/24/2015 17:00   Ct Chest W Contrast  08/24/2015  CLINICAL DATA:  Shortness of breath for 2 days with productive cough. Lung cancer patient currently on chemotherapy. EXAM: CT CHEST, ABDOMEN, AND PELVIS WITH CONTRAST TECHNIQUE: Multidetector CT imaging of the chest, abdomen and pelvis was performed following the standard protocol during bolus administration of intravenous contrast. CONTRAST:  37m OMNIPAQUE IOHEXOL 300 MG/ML SOLN, 1053mOMNIPAQUE IOHEXOL 300 MG/ML SOLN COMPARISON:  Chest radiographs earlier this day. Chest abdomen pelvis CT 05/25/2015 FINDINGS: CT  CHEST Volume loss in the left hemithorax post left upper lobe resection. Paramediastinal radiation changes again seen. New progressive ill-defined left lower lobe perihilar opacity with adjacent ground-glass opacities. Question of intraluminal debris in the left mainstem bronchus, new. Increased dependent atelectasis in the left lower lobe. Small left pleural effusion with equivocal increase in thickness. Paramediastinal radiation change in the right upper lobe is stable. Scattered small pulmonary nodules in both lungs again seen, unchanged. Heart is unchanged in size. Tortuous thoracic aorta without aneurysm. No pericardial effusion. No mediastinal or hilar adenopathy. 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 Minimal fluid in the esophagus without definite wall thickening. There are no acute or suspicious osseous abnormalities. Exaggerated thoracic kyphosis and degenerative change in the thoracic spine and sternal deformity are unchanged. CT ABDOMEN AND PELVIS Tiny hypodensity in the left lobe of the liver, image 56, unchanged from prior allowing for differences in technique. No new hepatic abnormality. Gallbladder  physiologically distended without calcified stone. No biliary dilatation. No adrenal nodule. No pancreatic ductal dilatation or surrounding inflammation. Spleen is normal. The stomach is decompressed. There are no dilated or thickened bowel loops. No bowel obstruction, oral contrast throughout the colon. Minimal diverticulosis of the distal colon without diverticulitis. Kidneys demonstrate symmetric enhancement and excretion. 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 No hydronephrosis. No retroperitoneal adenopathy. Abdominal aorta is normal in caliber. Right renal artery aneurysm, small, unchanged. No free air, free fluid, or intra-abdominal fluid collection. Unchanged appearance of the uterus with uterine fibroid. Bladder is physiologically distended. No adnexal mass. There are no acute or suspicious osseous abnormalities. Compression deformity of L4 is unchanged. IMPRESSION: 1. Post left upper lobectomy with postsurgical and post treatment related change in the left lung. Adjacent to the left hilum is new ill-defined and ground-glass opacities. There is questionable intraluminal debris in the left bronchus. Aspiration is considered versus infectious pneumonia. Recurent disease is not entirely excluded. 2. Small left pleural effusion with minimal increase from prior. 3. No acute abnormality in the abdomen/pelvis. Stable chronic findings as described. Electronically Signed   By: Jeb Levering M.D.   On: 08/24/2015 21:26   Ct Abdomen Pelvis W Contrast  08/24/2015  CLINICAL DATA:  Shortness of breath for 2 days with productive cough. Lung cancer patient currently on chemotherapy. EXAM: CT CHEST, ABDOMEN, AND PELVIS WITH CONTRAST TECHNIQUE: Multidetector CT imaging of the chest, abdomen and pelvis was performed following the standard protocol during bolus administration of intravenous contrast. CONTRAST:  74m OMNIPAQUE IOHEXOL 300 MG/ML SOLN, 103mOMNIPAQUE IOHEXOL 300 MG/ML SOLN COMPARISON:  Chest  radiographs earlier this day. Chest abdomen pelvis CT 05/25/2015 FINDINGS: CT CHEST Volume loss in the left hemithorax post left upper lobe resection. Paramediastinal radiation changes again seen. New progressive ill-defined left lower lobe perihilar opacity with adjacent ground-glass opacities. Question of intraluminal debris in the left mainstem bronchus, new. Increased dependent atelectasis in the left lower lobe. Small left pleural effusion with equivocal increase in thickness. Paramediastinal radiation change in the right upper lobe is stable. Scattered small pulmonary nodules in both lungs again seen, unchanged. Heart is unchanged in size. Tortuous thoracic aorta without aneurysm. No pericardial effusion. No mediastinal or hilar adenopathy. 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 Minimal fluid in the esophagus without definite wall thickening. There are no acute or suspicious osseous abnormalities. Exaggerated thoracic kyphosis and degenerative change in the thoracic spine and sternal deformity are unchanged. CT ABDOMEN AND PELVIS Tiny hypodensity in the left lobe of the  liver, image 56, unchanged from prior allowing for differences in technique. No new hepatic abnormality. Gallbladder physiologically distended without calcified stone. No biliary dilatation. No adrenal nodule. No pancreatic ductal dilatation or surrounding inflammation. Spleen is normal. The stomach is decompressed. There are no dilated or thickened bowel loops. No bowel obstruction, oral contrast throughout the colon. Minimal diverticulosis of the distal colon without diverticulitis. Kidneys demonstrate symmetric enhancement and excretion. 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 No hydronephrosis. No retroperitoneal adenopathy. Abdominal aorta is normal in caliber. Right renal artery aneurysm, small, unchanged. No free air, free fluid, or intra-abdominal fluid collection. Unchanged appearance of the uterus with uterine  fibroid. Bladder is physiologically distended. No adnexal mass. There are no acute or suspicious osseous abnormalities. Compression deformity of L4 is unchanged. IMPRESSION: 1. Post left upper lobectomy with postsurgical and post treatment related change in the left lung. Adjacent to the left hilum is new ill-defined and ground-glass opacities. There is questionable intraluminal debris in the left bronchus. Aspiration is considered versus infectious pneumonia. Recurent disease is not entirely excluded. 2. Small left pleural effusion with minimal increase from prior. 3. No acute abnormality in the abdomen/pelvis. Stable chronic findings as described. Electronically Signed   By: Jeb Levering M.D.   On: 08/24/2015 21:26    Micro Results   Recent Results (from the past 240 hour(s))  Culture, blood (routine x 2)     Status: None   Collection Time: 08/24/15  4:41 PM  Result Value Ref Range Status   Specimen Description BLOOD RIGHT ANTECUBITAL  Final   Special Requests BOTTLES DRAWN AEROBIC AND ANAEROBIC 5CC EACH  Final   Culture   Final    NO GROWTH 5 DAYS Performed at [facility]    Report Status 08/29/2015 FINAL  Final  Culture, blood (routine x 2)     Status: None (Preliminary result)   Collection Time: 08/25/15  4:28 AM  Result Value Ref Range Status   Specimen Description BLOOD LEFT ARM  Final   Special Requests IN PEDIATRIC BOTTLE [place]  Final   Culture   Final    NO GROWTH 4 DAYS Performed at [facility]    Report Status PENDING  Incomplete  C difficile quick scan w PCR reflex     Status: None   Collection Time: 08/26/15 10:37 PM  Result Value Ref Range Status   C Diff antigen NEGATIVE NEGATIVE Final   C Diff toxin NEGATIVE NEGATIVE Final   C Diff interpretation Negative for toxigenic C. difficile  Final       Today   Subjective:   Valerie [name] today has no headache,no chest abdominal pain,no new weakness tingling or numbness, feels much better wants  to go home today.   Objective:   Blood pressure 111/53, pulse 70, temperature 97.8 F (36.6 C), temperature source Oral, resp. rate 18, height '5\' 7"'$  (1.702 m), weight 84.5 kg (186 lb 4.6 oz), SpO2 93 %.   Intake/Output Summary (Last 24 hours) at 08/30/15 1117 Last data filed at 08/30/15 0839  Gross per 24 hour  Intake    960 ml  Output      0 ml  Net    960 ml    Exam Awake Alert, Oriented x 3, No new F.N deficits, Normal affect [place].AT,PERRAL Supple Neck,No JVD, No cervical lymphadenopathy appriciated.  Symmetrical Chest wall movement, Good air movement bilaterally, CTAB RRR,No Gallops,Rubs or new Murmurs, No Parasternal Heave +ve B.Sounds, Abd Soft, Non tender, No organomegaly  appriciated, No rebound -guarding or rigidity. No Cyanosis, Clubbing or edema, No new Rash or bruise  Data Review   CBC w Diff: Lab Results  Component Value Date   WBC 4.2 08/29/2015   WBC 4.1 08/09/2015   HGB 11.1* 08/29/2015   HGB 13.2 08/09/2015   HCT 34.0* 08/29/2015   HCT 41.3 08/09/2015   PLT 183 08/29/2015   PLT 196 08/09/2015   LYMPHOPCT 11 08/27/2015   LYMPHOPCT 16.9 08/09/2015   MONOPCT 12 08/27/2015   MONOPCT 9.5 08/09/2015   EOSPCT 0 08/27/2015   EOSPCT 2.6 08/09/2015   BASOPCT 0 08/27/2015   BASOPCT 0.5 08/09/2015    CMP: Lab Results  Component Value Date   NA 137 08/29/2015   NA 138 08/09/2015   NA 138 02/28/2012   K 3.6 08/29/2015   K 4.2 08/09/2015   K 4.6 02/28/2012   CL 102 08/29/2015   CL 106 12/09/2012   CL 99 02/28/2012   CO2 28 08/29/2015   CO2 24 08/09/2015   CO2 28 02/28/2012   BUN 9 08/29/2015   BUN 14.6 08/09/2015   BUN 12 02/28/2012   CREATININE 0.49 08/29/2015   CREATININE 1.1 08/09/2015   CREATININE 1.0 02/28/2012   PROT 5.6* 08/27/2015   PROT 7.2 08/09/2015   PROT 6.9 02/28/2012   ALBUMIN 2.7* 08/27/2015   ALBUMIN 4.2 08/09/2015   ALBUMIN 3.7 02/28/2012   BILITOT 1.1 08/27/2015   BILITOT 0.66 08/09/2015   BILITOT 1.30 02/28/2012   ALKPHOS  50 08/27/2015   ALKPHOS 59 08/09/2015   ALKPHOS 80 02/28/2012   AST 23 08/27/2015   AST 14 08/09/2015   AST 20 02/28/2012   ALT 21 08/27/2015   ALT 14 08/09/2015   ALT 21 02/28/2012  .   Total Time in preparing paper work, data evaluation and todays exam - 35 minutes  RANGA,SIMBISO M.D on 08/30/2015 at 11:17 AM  [place]  [phone]

## 2015-09-05 ENCOUNTER — Other Ambulatory Visit (HOSPITAL_BASED_OUTPATIENT_CLINIC_OR_DEPARTMENT_OTHER): Payer: Medicare Other

## 2015-09-05 ENCOUNTER — Ambulatory Visit (HOSPITAL_COMMUNITY): Admission: RE | Admit: 2015-09-05 | Payer: Medicare Other | Source: Ambulatory Visit

## 2015-09-05 DIAGNOSIS — C3411 Malignant neoplasm of upper lobe, right bronchus or lung: Secondary | ICD-10-CM | POA: Diagnosis not present

## 2015-09-05 DIAGNOSIS — C7801 Secondary malignant neoplasm of right lung: Secondary | ICD-10-CM

## 2015-09-05 DIAGNOSIS — Z5111 Encounter for antineoplastic chemotherapy: Secondary | ICD-10-CM

## 2015-09-05 LAB — CBC WITH DIFFERENTIAL/PLATELET
BASO%: 0.2 % (ref 0.0–2.0)
Basophils Absolute: 0 10*3/uL (ref 0.0–0.1)
EOS%: 1.7 % (ref 0.0–7.0)
Eosinophils Absolute: 0.1 10*3/uL (ref 0.0–0.5)
HCT: 40.4 % (ref 34.8–46.6)
HGB: 13 g/dL (ref 11.6–15.9)
LYMPH%: 13.6 % — ABNORMAL LOW (ref 14.0–49.7)
MCH: 29.7 pg (ref 25.1–34.0)
MCHC: 32.2 g/dL (ref 31.5–36.0)
MCV: 92.4 fL (ref 79.5–101.0)
MONO#: 0.5 10*3/uL (ref 0.1–0.9)
MONO%: 8 % (ref 0.0–14.0)
NEUT#: 4.5 10*3/uL (ref 1.5–6.5)
NEUT%: 76.5 % (ref 38.4–76.8)
Platelets: 341 10*3/uL (ref 145–400)
RBC: 4.37 10*6/uL (ref 3.70–5.45)
RDW: 14.5 % (ref 11.2–14.5)
WBC: 5.9 10*3/uL (ref 3.9–10.3)
lymph#: 0.8 10*3/uL — ABNORMAL LOW (ref 0.9–3.3)
nRBC: 0 % (ref 0–0)

## 2015-09-05 LAB — COMPREHENSIVE METABOLIC PANEL
ALT: 15 U/L (ref 0–55)
AST: 12 U/L (ref 5–34)
Albumin: 3.7 g/dL (ref 3.5–5.0)
Alkaline Phosphatase: 58 U/L (ref 40–150)
Anion Gap: 12 mEq/L — ABNORMAL HIGH (ref 3–11)
BUN: 17.4 mg/dL (ref 7.0–26.0)
CO2: 24 mEq/L (ref 22–29)
Calcium: 10 mg/dL (ref 8.4–10.4)
Chloride: 104 mEq/L (ref 98–109)
Creatinine: 0.9 mg/dL (ref 0.6–1.1)
EGFR: 77 mL/min/{1.73_m2} — ABNORMAL LOW (ref >90)
Glucose: 96 mg/dl (ref 70–140)
Potassium: 4.2 mEq/L (ref 3.5–5.1)
Sodium: 140 mEq/L (ref 136–145)
Total Bilirubin: 0.57 mg/dL (ref 0.20–1.20)
Total Protein: 7.4 g/dL (ref 6.4–8.3)

## 2015-09-06 ENCOUNTER — Other Ambulatory Visit: Payer: Self-pay

## 2015-09-08 ENCOUNTER — Other Ambulatory Visit: Payer: Self-pay | Admitting: Medical Oncology

## 2015-09-08 ENCOUNTER — Telehealth: Payer: Self-pay | Admitting: Medical Oncology

## 2015-09-08 NOTE — Telephone Encounter (Signed)
Had scan 08/24/15-does [facility] still want her to have scan. I called Tedra Coupe to cancel CT

## 2015-09-09 ENCOUNTER — Ambulatory Visit (HOSPITAL_COMMUNITY): Payer: Medicare Other

## 2015-09-12 DIAGNOSIS — H43813 Vitreous degeneration, bilateral: Secondary | ICD-10-CM | POA: Diagnosis not present

## 2015-09-12 DIAGNOSIS — H2513 Age-related nuclear cataract, bilateral: Secondary | ICD-10-CM | POA: Diagnosis not present

## 2015-09-13 ENCOUNTER — Other Ambulatory Visit: Payer: Self-pay | Admitting: Medical Oncology

## 2015-09-13 ENCOUNTER — Encounter: Payer: Self-pay | Admitting: Internal Medicine

## 2015-09-13 ENCOUNTER — Telehealth: Payer: Self-pay | Admitting: Internal Medicine

## 2015-09-13 ENCOUNTER — Ambulatory Visit (HOSPITAL_BASED_OUTPATIENT_CLINIC_OR_DEPARTMENT_OTHER): Payer: Medicare Other | Admitting: Internal Medicine

## 2015-09-13 VITALS — BP 120/71 | HR 111 | Temp 97.8°F | Resp 17 | Ht 67.0 in | Wt 187.8 lb

## 2015-09-13 DIAGNOSIS — C7931 Secondary malignant neoplasm of brain: Secondary | ICD-10-CM | POA: Diagnosis not present

## 2015-09-13 DIAGNOSIS — C3411 Malignant neoplasm of upper lobe, right bronchus or lung: Secondary | ICD-10-CM

## 2015-09-13 NOTE — Progress Notes (Signed)
[place] Telephone:([phone]) [phone]   Fax:(336) [phone]  OFFICE PROGRESS NOTE  [name], MD [address] [place] [address]  DIAGNOSIS: Metastatic non-small cell lung cancer with brain metastasis, diagnosed initially a stage IIB adenocarcinoma in October 2002.  BIOMARKERS TESTING ([facility]): Positive for: EGFR [id number] del, T790M, CTNNB1 D32G, SMAD4 T164f*10 Negative for: RET, ALK, BRAF, KRAS, ERBB2 and MET  PRIOR THERAPY:  1. Status post left lower lobectomy under the care of Dr BArlyce Dicein October 2002. 2. Status post course of concurrent chemoradiation with weekly carboplatin and paclitaxel under the care of Dr SBenay Spiceand Dr WElba Barmanin early 2003. 3. Status post treatment with Celebrex and [facility] 4. Status post left occipital craniotomy for tumor resection on September 21, 2005, under the care of Dr CChristella Noa 5. Status post whole brain irradiation under the care of Dr WElba Barman completed November 07, 2005. 6. Status post gamma knife treatment to the resected [facility] 7. Status post right thyroidectomy with radical neck dissection under the care of Dr. RConstance Holsteron 10/18/2011. 8. Status post excisional biopsy of right cervical lymph node that was consistent with metastatic adenocarcinoma. 9. Tarceva 150 mg by mouth daily with therapy beginning 06/03/2007, discontinued in July 2015 secondary to disease progression. Status post 79 cycles. 10. palliative radiotherapy to the enlarging right upper lobe lung nodule under the care of Dr. MValere Dross [facility]. [id number] 300 mg by mouth daily for 21 days discontinued secondary to study deviation and [facility]. 12. Open reduction and internal  fixation of trimalleolar ankle fracture dislocation with fixation of the fibula as well as medial          malleolus.   CURRENT THERAPY:  1) Tagrisso 80 mg by mouth daily, started 11/25/2014, status post 9 months of treatment.  INTERVAL HISTORY: Valerie BEASTON70y.o. female returns to the clinic today for follow up visit. The patient is feeling fine today was no specific complaints. [facility] with fever or chills as well as productive cough and she was diagnosed with pneumonia. CT scan of the chest, abdomen and pelvis at that time showed no evidence for disease progression. The patient is currently on treatment with Tagrisso 80 mg by mouth daily for the last 9 months and she is tolerating her treatment fairly well with no significant adverse effects. She denied having any significant skin rash. She is feeling fine with no specific complaints except for occasional dizzy spells. She denied having any significant chest pain, shortness of breath, cough or hemoptysis. She denied having any significant nausea or vomiting. She had repeat CBC and comprehensive metabolic panel performed earlier today and she is here for evaluation and discussion of her lab results.  MEDICAL HISTORY: Past Medical History  Diagnosis Date  . Heart murmur   . Shortness of breath     hx lung ca   . Arthritis   . Anal fistula   . H/O osteoporosis   . H/O vitamin D deficiency   . History of measles, mumps, or rubella   . H/O varicella   . Hypertension   . Yeast infection   . Abnormal Pap smear 2006  .  Atrophic vaginitis 2008  . Dyspareunia 2008  . Dementia 2009  . Osteoporosis 2010  . Status post chemotherapy 2003    CARBOPLATIN/PACLITAXEL /STATUS POST CLINICAL TRAIL OF CELEBREX AND IRESSA AT [facility] FOR 1 YEAR  . Status post radiation therapy 2003    LEFT LUNG  . Status post radiation therapy 11/07/2005    WHOLE BRAIN: DR Larkin In[facility]  . Status post radiation  therapy 06/02/2008    GAMMA KNIFE OF RESECTED CAVITAY  . On antineoplastic chemotherapy     TARCEVA  . Nodule of right lung CT- 06/03/12    RIGHT UPPER LOBE  . Thyroid adenoma     ?  [name] Anxiety   . Cataract   . Nodule of right lung 06/03/12    Upper Lobe  . History of radiation therapy 07/28/13- 08/10/13    right lung metastasis 5000 cGy 10 sessions  . Ankle fracture   . Lung cancer ([place]) dx'd 2002  . Metastasis to brain [facility]) dx'd 2008  . 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 Thyroid cancer ([place]) 10/18/11 bx    adenoid nodules   . Lung metastasis ([place])     PET scan 05/05/13, RUL lung nodule  . Lung cancer ([place])   . Lung cancer ([place])   . Primary cancer of right upper lobe of lung ([place]) 04/22/2009    Qualifier: Diagnosis of  By: Nils Pyle CMA (AAMA), Mearl Latin      ALLERGIES:  has No Known Allergies.  MEDICATIONS:  Current Outpatient Prescriptions  Medication Sig Dispense Refill  . alendronate (FOSAMAX) 70 MG tablet Take 1 tablet (70 mg total) by mouth every 7 (seven) days. Saturdays; Take with a full glass of water on an empty stomach. 4 tablet 12  . Ascorbic Acid (VITAMIN C) 100 MG tablet Take 100 mg by mouth daily.    . chlorpheniramine-HYDROcodone (TUSSIONEX) 10-8 MG/5ML SUER Take 5 mLs by mouth every 12 (twelve) hours as needed for cough. 140 mL 0  . cholecalciferol (VITAMIN D) 1000 UNITS tablet Take 1,000 Units by mouth at bedtime.    . donepezil (ARICEPT) 10 MG tablet Take 10 mg by mouth daily.  0  . loperamide (IMODIUM) 2 MG capsule Take 2 mg by mouth as needed for diarrhea or loose stools.     . Multiple Vitamin (MULTI-VITAMINS) TABS Take 1 tablet by mouth daily.     . naproxen sodium (ANAPROX) 220 MG tablet Take 220 mg by mouth 2 (two) times daily as needed (pain).    . pantoprazole (PROTONIX) 40 MG tablet Take 1 tablet (40 mg total) by mouth daily at 12 noon. 10 tablet 0  . research study medication Take home supply. (Patient taking differently: Take 80 mg by mouth at  bedtime. Take home supply.)    . UNABLE TO FIND Med Name: Wig Dx primary Cancer of right upper lobe of lung (Patient not taking: Reported on 08/24/2015) 1 each 0  . XARELTO 20 MG TABS tablet Take 1 tablet (20 mg total) by mouth daily. 30 tablet 0   No current facility-administered medications for this visit.    SURGICAL HISTORY:  Past Surgical History  Procedure Laterality Date  . Left occipital craniotomy  09/21/2005    tumor  . Left lower lobectomy  05/2001  . Radical neck dissection  10/18/2011    Procedure: RADICAL NECK DISSECTION;  Surgeon: Izora Gala, MD;  Location: [place];  Service: ENT;  Laterality: Right;  RIGHT MODIFIED NECK DISSECTION /POSSIBLE RIGHT THYROIDECTOM  .  Thyroidectomy  10/18/2011    Procedure: THYROIDECTOMY WITH RADICAL NECK DISSECTION;  Surgeon: Izora Gala, MD;  Location: [place];  Service: ENT;  Laterality: Right;  . Lung lobectomy    . Neck surgery    . Brain surgery    . Orif ankle fracture Left 10/02/2014    Procedure: OPEN REDUCTION INTERNAL FIXATION (ORIF) ANKLE FRACTURE;  Surgeon: Alta Corning, MD;  Location: [facility];  Service: Orthopedics;  Laterality: Left;    REVIEW OF SYSTEMS:  Constitutional: positive for fatigue Eyes: negative Ears, nose, mouth, throat, and face: negative Respiratory: negative Cardiovascular: negative Gastrointestinal: negative Genitourinary:negative Integument/breast: negative Hematologic/lymphatic: negative Musculoskeletal:positive for muscle weakness Neurological: negative Behavioral/Psych: negative Endocrine: negative Allergic/Immunologic: negative   PHYSICAL EXAMINATION: General appearance: alert, cooperative, fatigued and no distress Head: Normocephalic, without obvious abnormality, atraumatic Neck: no adenopathy Lymph nodes: Cervical, supraclavicular, and axillary nodes normal. Resp: clear to auscultation bilaterally Cardio: regular rate and rhythm, S1, S2 normal, no murmur, click, rub or gallop GI: soft, non-tender;  bowel sounds normal; no masses,  no organomegaly Extremities: extremities normal, atraumatic, no cyanosis or edema Neurologic: Alert and oriented X 3, normal strength and tone. Normal symmetric reflexes. Normal coordination and gait  ECOG PERFORMANCE STATUS: 1 - Symptomatic but completely ambulatory  Blood pressure 120/71, pulse 111, temperature 97.8 F (36.6 C), temperature source Oral, resp. rate 17, height '5\' 7"'$  (1.702 m), weight 187 lb 12.8 oz (85.186 kg), SpO2 98 %.  LABORATORY DATA: Lab Results  Component Value Date   WBC 5.9 09/05/2015   HGB 13.0 09/05/2015   HCT 40.4 09/05/2015   MCV 92.4 09/05/2015   PLT 341 09/05/2015      Chemistry      Component Value Date/Time   NA 140 09/05/2015 1109   NA 137 08/29/2015 0505   NA 138 02/28/2012 0939   K 4.2 09/05/2015 1109   K 3.6 08/29/2015 0505   K 4.6 02/28/2012 0939   CL 102 08/29/2015 0505   CL 106 12/09/2012 0806   CL 99 02/28/2012 0939   CO2 24 09/05/2015 1109   CO2 28 08/29/2015 0505   CO2 28 02/28/2012 0939   BUN 17.4 09/05/2015 1109   BUN 9 08/29/2015 0505   BUN 12 02/28/2012 0939   CREATININE 0.9 09/05/2015 1109   CREATININE 0.49 08/29/2015 0505   CREATININE 1.0 02/28/2012 0939      Component Value Date/Time   CALCIUM 10.0 09/05/2015 1109   CALCIUM 8.8* 08/29/2015 0505   CALCIUM 9.1 02/28/2012 0939   ALKPHOS 58 09/05/2015 1109   ALKPHOS 50 08/27/2015 0553   ALKPHOS 80 02/28/2012 0939   AST 12 09/05/2015 1109   AST 23 08/27/2015 0553   AST 20 02/28/2012 0939   ALT 15 09/05/2015 1109   ALT 21 08/27/2015 0553   ALT 21 02/28/2012 0939   BILITOT 0.57 09/05/2015 1109   BILITOT 1.1 08/27/2015 0553   BILITOT 1.30 02/28/2012 0939       RADIOGRAPHIC STUDIES: Dg Chest 2 View  08/24/2015  CLINICAL DATA:  Shortness of breath, productive cough. EXAM: CHEST  2 VIEW COMPARISON:  September 30, 2014. FINDINGS: Stable cardiomegaly is noted. Postsurgical changes are again identified in the left hilum. No pneumothorax  is noted. There is again noted elevated left hemidiaphragm posteriorly. Patient is rotated to the left. No acute pulmonary disease is noted. No definite pleural effusion is noted. IMPRESSION: Stable postsurgical changes as described above. No acute abnormality seen. Electronically Signed   By: Sabino Dick  Valerie [name], M.D.   On: 08/24/2015 17:00   Ct Chest W Contrast  08/24/2015  CLINICAL DATA:  Shortness of breath for 2 days with productive cough. Lung cancer patient currently on chemotherapy. EXAM: CT CHEST, ABDOMEN, AND PELVIS WITH CONTRAST TECHNIQUE: Multidetector CT imaging of the chest, abdomen and pelvis was performed following the standard protocol during bolus administration of intravenous contrast. CONTRAST:  47m OMNIPAQUE IOHEXOL 300 MG/ML SOLN, 1073mOMNIPAQUE IOHEXOL 300 MG/ML SOLN COMPARISON:  Chest radiographs earlier this day. Chest abdomen pelvis CT 05/25/2015 FINDINGS: CT CHEST Volume loss in the left hemithorax post left upper lobe resection. Paramediastinal radiation changes again seen. New progressive ill-defined left lower lobe perihilar opacity with adjacent ground-glass opacities. Question of intraluminal debris in the left mainstem bronchus, new. Increased dependent atelectasis in the left lower lobe. Small left pleural effusion with equivocal increase in thickness. Paramediastinal radiation change in the right upper lobe is stable. Scattered small pulmonary nodules in both lungs again seen, unchanged. Heart is unchanged in size. Tortuous thoracic aorta without aneurysm. No pericardial effusion. No mediastinal or hilar adenopathy. 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 Minimal fluid in the esophagus without definite wall thickening. There are no acute or suspicious osseous abnormalities. Exaggerated thoracic kyphosis and degenerative change in the thoracic spine and sternal deformity are unchanged. CT ABDOMEN AND PELVIS Tiny hypodensity in the left lobe of the liver, image 56,  unchanged from prior allowing for differences in technique. No new hepatic abnormality. Gallbladder physiologically distended without calcified stone. No biliary dilatation. No adrenal nodule. No pancreatic ductal dilatation or surrounding inflammation. Spleen is normal. The stomach is decompressed. There are no dilated or thickened bowel loops. No bowel obstruction, oral contrast throughout the colon. Minimal diverticulosis of the distal colon without diverticulitis. Kidneys demonstrate symmetric enhancement and excretion. 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 No hydronephrosis. No retroperitoneal adenopathy. Abdominal aorta is normal in caliber. Right renal artery aneurysm, small, unchanged. No free air, free fluid, or intra-abdominal fluid collection. Unchanged appearance of the uterus with uterine fibroid. Bladder is physiologically distended. No adnexal mass. There are no acute or suspicious osseous abnormalities. Compression deformity of L4 is unchanged. IMPRESSION: 1. Post left upper lobectomy with postsurgical and post treatment related change in the left lung. Adjacent to the left hilum is new ill-defined and ground-glass opacities. There is questionable intraluminal debris in the left bronchus. Aspiration is considered versus infectious pneumonia. Recurent disease is not entirely excluded. 2. Small left pleural effusion with minimal increase from prior. 3. No acute abnormality in the abdomen/pelvis. Stable chronic findings as described. Electronically Signed   By: Valerie Levering.D.   On: 08/24/2015 21:26   Ct Abdomen Pelvis W Contrast  08/24/2015  CLINICAL DATA:  Shortness of breath for 2 days with productive cough. Lung cancer patient currently on chemotherapy. EXAM: CT CHEST, ABDOMEN, AND PELVIS WITH CONTRAST TECHNIQUE: Multidetector CT imaging of the chest, abdomen and pelvis was performed following the standard protocol during bolus administration of intravenous contrast. CONTRAST:   2558mMNIPAQUE IOHEXOL 300 MG/ML SOLN, 100m25mNIPAQUE IOHEXOL 300 MG/ML SOLN COMPARISON:  Chest radiographs earlier this day. Chest abdomen pelvis CT 05/25/2015 FINDINGS: CT CHEST Volume loss in the left hemithorax post left upper lobe resection. Paramediastinal radiation changes again seen. New progressive ill-defined left lower lobe perihilar opacity with adjacent ground-glass opacities. Question of intraluminal debris in the left mainstem bronchus, new. Increased dependent atelectasis in the left lower lobe. Small left pleural effusion with equivocal increase in  thickness. Paramediastinal radiation change in the right upper lobe is stable. Scattered small pulmonary nodules in both lungs again seen, unchanged. Heart is unchanged in size. Tortuous thoracic aorta without aneurysm. No pericardial effusion. No mediastinal or hilar adenopathy. 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 Minimal fluid in the esophagus without definite wall thickening. There are no acute or suspicious osseous abnormalities. Exaggerated thoracic kyphosis and degenerative change in the thoracic spine and sternal deformity are unchanged. CT ABDOMEN AND PELVIS Tiny hypodensity in the left lobe of the liver, image 56, unchanged from prior allowing for differences in technique. No new hepatic abnormality. Gallbladder physiologically distended without calcified stone. No biliary dilatation. No adrenal nodule. No pancreatic ductal dilatation or surrounding inflammation. Spleen is normal. The stomach is decompressed. There are no dilated or thickened bowel loops. No bowel obstruction, oral contrast throughout the colon. Minimal diverticulosis of the distal colon without diverticulitis. Kidneys demonstrate symmetric enhancement and excretion. 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 No hydronephrosis. No retroperitoneal adenopathy. Abdominal aorta is normal in caliber. Right renal artery aneurysm, small, unchanged. No free air,  free fluid, or intra-abdominal fluid collection. Unchanged appearance of the uterus with uterine fibroid. Bladder is physiologically distended. No adnexal mass. There are no acute or suspicious osseous abnormalities. Compression deformity of L4 is unchanged. IMPRESSION: 1. Post left upper lobectomy with postsurgical and post treatment related change in the left lung. Adjacent to the left hilum is new ill-defined and ground-glass opacities. There is questionable intraluminal debris in the left bronchus. Aspiration is considered versus infectious pneumonia. Recurent disease is not entirely excluded. 2. Small left pleural effusion with minimal increase from prior. 3. No acute abnormality in the abdomen/pelvis. Stable chronic findings as described. Electronically Signed   By: Valerie Levering M.D.   On: 08/24/2015 21:26   ASSESSMENT AND PLAN: this is a very pleasant 70 years old [place] American female with metastatic non-small cell lung cancer adenocarcinoma, with positive EGFR mutation but also resistant mutation T790M. She has been on treatment with oral Tarceva for more than 6 years.  The patient has EGFR resistant mutation T790M and was treated for the last 8 months with [id number] on a clinical trial at [facility] but this was discontinued secondary to inability to follow-up with the protocol requirement. She had no evidence for disease progression on that treatment. The patient is currently on Tagrisso 80 mg by mouth daily status post 9 months and tolerating it fairly well with no significant adverse effects.  The recent CT scan of the chest, abdomen and pelvis showed no evidence for disease progression. She was treated for the questionable pneumonia on the previous scan. I discussed the scan results with the patient today. I recommended for the patient to continue her current treatment with Tagrisso. I will see her back for follow-up visit in 6 weeks for reevaluation after repeating CBC and  comprehensive metabolic panel. The patient was given prescription for cranial prosthesis today. She was advised to call immediately if she has any concerning symptoms in the interval. The patient voices understanding of current disease status and treatment options and is in agreement with the current care plan.  All questions were answered. The patient knows to call the clinic with any problems, questions or concerns. We can certainly see the patient much sooner if necessary.  Disclaimer: This note was dictated with voice recognition software. Similar sounding words can inadvertently be transcribed and may not be corrected upon review.

## 2015-09-13 NOTE — Telephone Encounter (Signed)
Pt confirmed labs/ov per 01/24 POF, gave pt AVS and Calendar... KJ

## 2015-09-27 ENCOUNTER — Other Ambulatory Visit: Payer: Self-pay | Admitting: Internal Medicine

## 2015-09-27 MED FILL — *TAGRISSO 80MG TABLET: 80 | 30 days supply | Qty: 30 | Fill #0

## 2015-10-15 IMAGING — CT CT NECK W/ CM
3 of 9 series · 11 of 33 positions shown, 12 images · IV contrast (OMNIPAQUE)
Comparison: PET-CT from 05/05/2013.

CLINICAL DATA: Metastatic non-small cell lung cancer with brain
metastasis, diagnosed initially a stage IIB adenocarcinoma in
May 2001

EXAM:
CT ABDOMEN AND PELVIS WITH CONTRAST; CT CHEST WITH CONTRAST; CT NECK
WITH CONTRAST
TECHNIQUE: Multidetector CT imaging of the neck was performed with intravenous
contrast.; Multidetector CT imaging of the abdomen and pelvis was
performed following the standard protocol during bolus
administration of intravenous contrast.; Multidetector CT imaging of
the chest was performed following the standard protocol during bolus
administration of intravenous contrast.
CONTRAST:  125mL OMNIPAQUE IOHEXOL 300 MG/ML  SOLN

[Series 2: cap st · axial · 0.84mm/px · z∈[+746,+1076]mm · 3 of 133 slices shown, 4 images]
[im 34/133  soft-tissue]
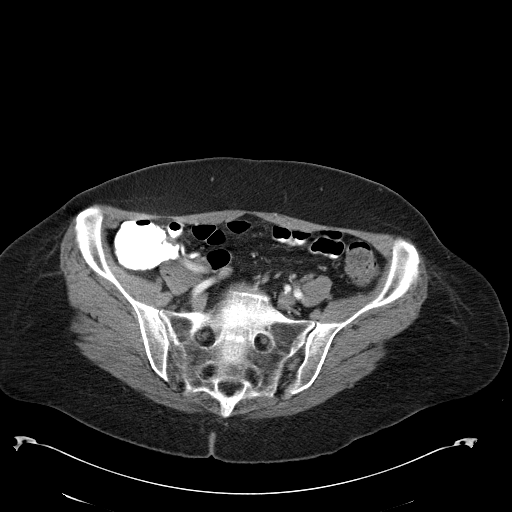
[im 34/133  bone]
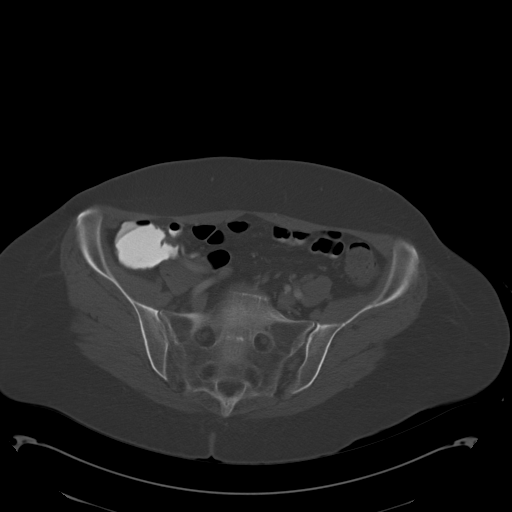
[im 67/133  bone]
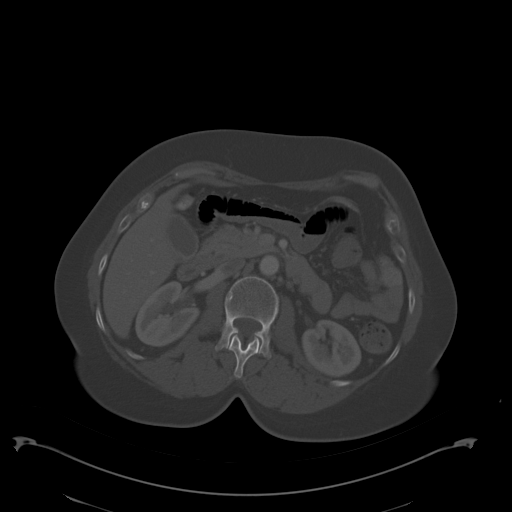
[im 100/133  bone]
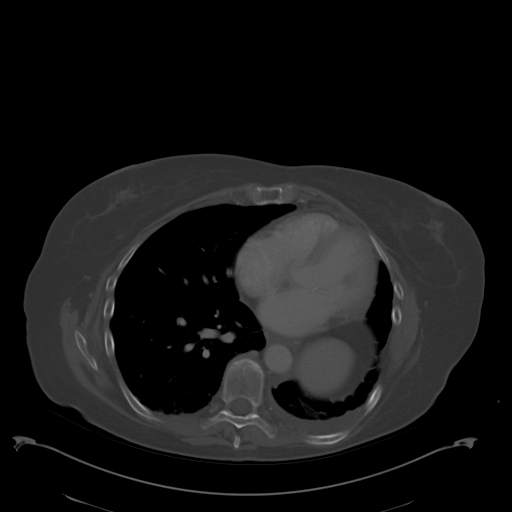

[Series 602: <mpr thick range> · coronal · 1.29mm/px · 3 of 83 slices shown]
[im 21/83  bone]
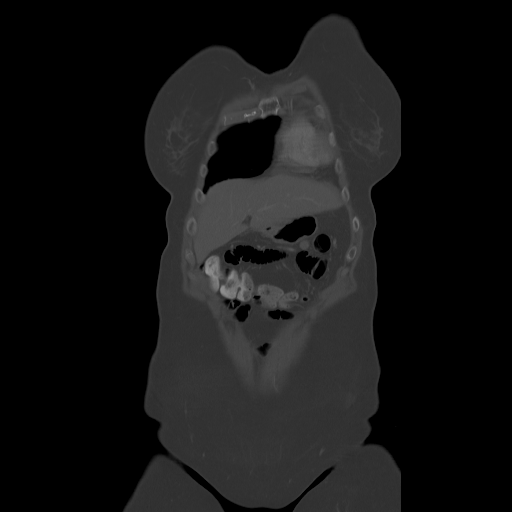
[im 42/83  bone]
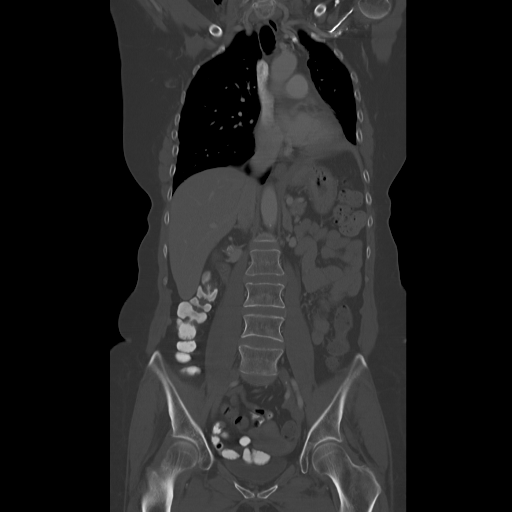
[im 62/83  bone]
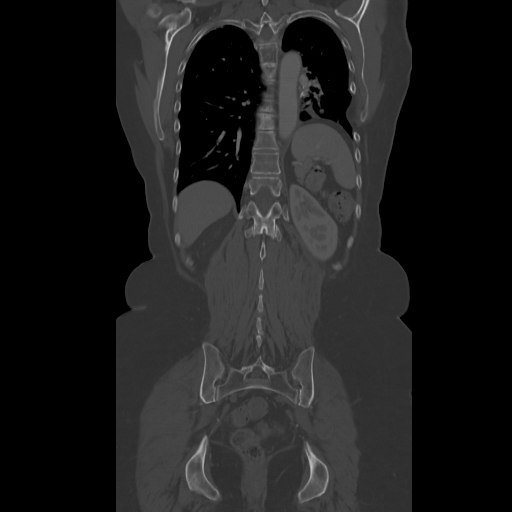

[Series 603: <mpr thick range(1)> · sagittal · 1.29mm/px · 5 of 113 slices shown]
[im 19/113  bone]
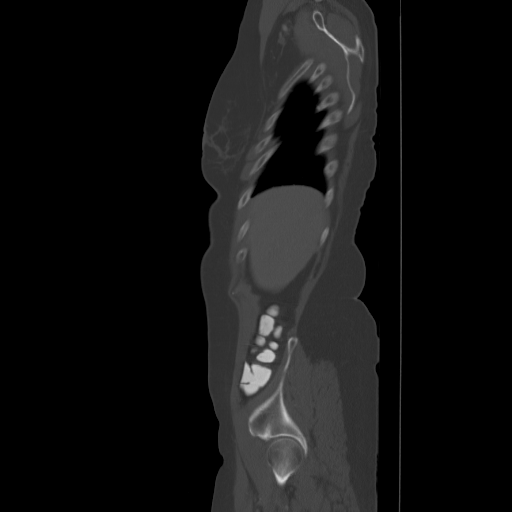
[im 38/113  bone]
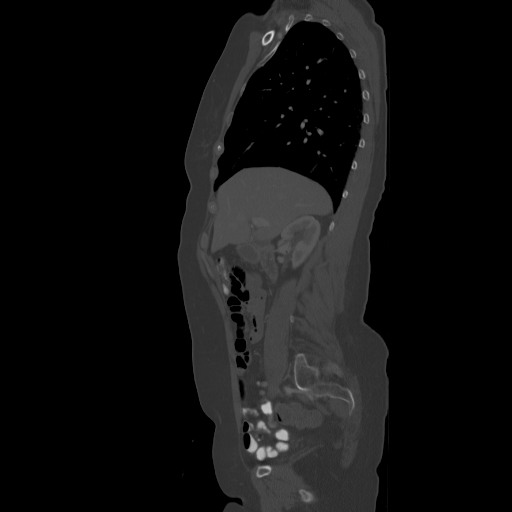
[im 57/113  bone]
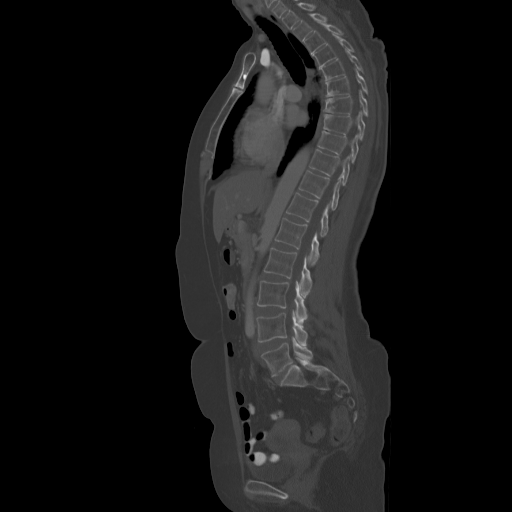
[im 75/113  bone]
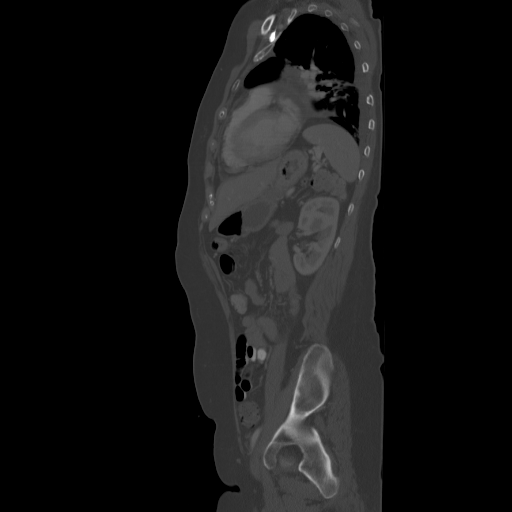
[im 94/113  bone]
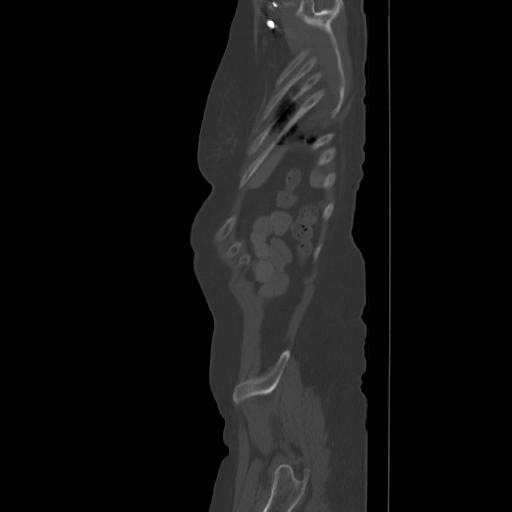

[11 of 33 positions shown; findings below may reference images not displayed]

Neck and chest CT from
04/01/2013. Neck, chest, abdomen and pelvis CT from 12/09/2012.
FINDINGS: CT NECK FINDINGS

The patient had a hypermetabolic level V right cervical lymph node
on the previous PET-CT. Given the differential arm positions between
the previous 04/01/2013 exam, the subsequent PET-CT, and today's CT,
this area projects in a different location of all 3 exams. On
today's study there is no lymphadenopathy in the lower right neck.
The previous lymph node of concern is not discretely identified, but
is probably on image 102 of series 7 Pain confluent with venous and
arterial anatomy. It is difficult to discretely visualize secondary
to bolus timing and incomplete opacification of the venous blood
pool.

The right thyroid lobe is surgically absent. Scattered tiny lymph
nodes are seen in the neck bilaterally, but are not pathologically
enlarged.

Bone windows reveal no worrisome lytic or sclerotic osseous lesions.
Craniotomy defect is identified in the left posterior skull.

CT CHEST FINDINGS

There is no axillary lymphadenopathy. No mediastinal
lymphadenopathy. Abnormal a amorphous soft tissue attenuation in the
left inferior hilar region is stable. There is a tiny left pleural
effusion as before.

Pulmonary venous anatomy is compatible with previous left lower
lobectomy. The a 4 mm right upper lobe nodule on image 21 is
unchanged. Heart size is normal. There is trace pericardial fluid,
stable.

The previous identified hypermetabolic right upper lobe pulmonary
nodule has decreased in size, measuring 7 mm today compared to 13 mm
on the PET-CT. Previously measured 4 mm left upper lobe nodule is
seen on image 17 today of series 5 and is unchanged at 4 mm. A 7 mm
nodule in the lower left lung on image 30 was 9 mm previously. There
a 7 mm nodule at the left base on image 32 which was 6 mm
previously.

Bone windows reveal no worrisome lytic or sclerotic osseous lesions.

CT ABDOMEN AND PELVIS FINDINGS

No focal abnormality is seen in the liver or spleen. The stomach,
duodenum, pancreas, gallbladder, and right adrenal gland are
unremarkable. Thickening of the left adrenal gland is unchanged
without adrenal nodule evident. Right kidney is normal. 9 mm
saccular aneurysm noted in the right renal artery. There is
duplicated left intrarenal collecting system with at least partial
duplication of the left ureter.

No abdominal aortic aneurysm. No free fluid or lymphadenopathy in
the abdomen.

Imaging through the pelvis shows no free intraperitoneal fluid.
There is no pelvic sidewall lymphadenopathy. Bladder is
decompressed. Uterus is unremarkable. No evidence for adnexal mass.
The terminal ileum is normal. The appendix is normal.

Bone windows reveal no worrisome lytic or sclerotic osseous lesions.
IMPRESSION: 1. The right cervical level 5 lymph node is not discretely
identified on today's study. I think it is blending in with a venous
confluence in the lower right neck. This lymph node is likely not
substantially changed in the interval.
2. The 13 mm right upper lobe hypermetabolic pulmonary nodule has
decreased in size, now measuring 7 mm.
3. Scattered other tiny bilateral pulmonary parenchymal nodules are
unchanged.
4. No new or progressive disease in the neck, chest, abdomen, or
pelvis.

## 2015-10-25 ENCOUNTER — Ambulatory Visit (HOSPITAL_BASED_OUTPATIENT_CLINIC_OR_DEPARTMENT_OTHER): Payer: Medicare Other | Admitting: Internal Medicine

## 2015-10-25 ENCOUNTER — Telehealth: Payer: Self-pay | Admitting: Internal Medicine

## 2015-10-25 ENCOUNTER — Encounter: Payer: Self-pay | Admitting: Internal Medicine

## 2015-10-25 ENCOUNTER — Other Ambulatory Visit (HOSPITAL_BASED_OUTPATIENT_CLINIC_OR_DEPARTMENT_OTHER): Payer: Medicare Other

## 2015-10-25 VITALS — BP 117/64 | HR 101 | Temp 97.5°F | Resp 17 | Ht 67.0 in | Wt 188.6 lb

## 2015-10-25 DIAGNOSIS — C7801 Secondary malignant neoplasm of right lung: Secondary | ICD-10-CM | POA: Diagnosis not present

## 2015-10-25 DIAGNOSIS — Z5111 Encounter for antineoplastic chemotherapy: Secondary | ICD-10-CM

## 2015-10-25 DIAGNOSIS — C3411 Malignant neoplasm of upper lobe, right bronchus or lung: Secondary | ICD-10-CM | POA: Diagnosis not present

## 2015-10-25 DIAGNOSIS — C7931 Secondary malignant neoplasm of brain: Secondary | ICD-10-CM

## 2015-10-25 LAB — COMPREHENSIVE METABOLIC PANEL
ALT: 11 U/L (ref 0–55)
AST: 12 U/L (ref 5–34)
Albumin: 4.1 g/dL (ref 3.5–5.0)
Alkaline Phosphatase: 58 U/L (ref 40–150)
Anion Gap: 9 mEq/L (ref 3–11)
BUN: 11.7 mg/dL (ref 7.0–26.0)
CO2: 24 mEq/L (ref 22–29)
Calcium: 9.3 mg/dL (ref 8.4–10.4)
Chloride: 106 mEq/L (ref 98–109)
Creatinine: 0.9 mg/dL (ref 0.6–1.1)
EGFR: 76 mL/min/{1.73_m2} — ABNORMAL LOW (ref >90)
Glucose: 92 mg/dl (ref 70–140)
Potassium: 3.9 mEq/L (ref 3.5–5.1)
Sodium: 139 mEq/L (ref 136–145)
Total Bilirubin: 0.59 mg/dL (ref 0.20–1.20)
Total Protein: 7 g/dL (ref 6.4–8.3)

## 2015-10-25 LAB — CBC WITH DIFFERENTIAL/PLATELET
BASO%: 0.5 % (ref 0.0–2.0)
Basophils Absolute: 0 10*3/uL (ref 0.0–0.1)
EOS%: 1.5 % (ref 0.0–7.0)
Eosinophils Absolute: 0.1 10*3/uL (ref 0.0–0.5)
HCT: 40 % (ref 34.8–46.6)
HGB: 13 g/dL (ref 11.6–15.9)
LYMPH%: 15.7 % (ref 14.0–49.7)
MCH: 29.6 pg (ref 25.1–34.0)
MCHC: 32.4 g/dL (ref 31.5–36.0)
MCV: 91.4 fL (ref 79.5–101.0)
MONO#: 0.4 10*3/uL (ref 0.1–0.9)
MONO%: 9 % (ref 0.0–14.0)
NEUT#: 3 10*3/uL (ref 1.5–6.5)
NEUT%: 73.3 % (ref 38.4–76.8)
Platelets: 201 10*3/uL (ref 145–400)
RBC: 4.38 10*6/uL (ref 3.70–5.45)
RDW: 14.3 % (ref 11.2–14.5)
WBC: 4.1 10*3/uL (ref 3.9–10.3)
lymph#: 0.6 10*3/uL — ABNORMAL LOW (ref 0.9–3.3)

## 2015-10-25 NOTE — Telephone Encounter (Signed)
Gave patient avs report and appointments for April  °

## 2015-10-25 NOTE — Progress Notes (Signed)
[place] [phone]   Fax:(336) [phone]  OFFICE PROGRESS NOTE  [name], MD [address] [place] [address]  DIAGNOSIS: Metastatic non-small cell lung cancer with brain metastasis, diagnosed initially a stage IIB adenocarcinoma in October 2002.  BIOMARKERS TESTING ([facility]): Positive for: EGFR E746-A750 del, T790M, CTNNB1 D32G, SMAD4 T14f*10 Negative for: RET, ALK, BRAF, KRAS, ERBB2 and MET  PRIOR THERAPY:  1. Status post left lower lobectomy under the care of Dr BArlyce Dicein October 2002. 2. Status post course of concurrent chemoradiation with weekly carboplatin and paclitaxel under the care of Dr SBenay Spiceand Dr WElba Barmanin early 2003. 3. Status post [facility] 4. Status post left occipital craniotomy for tumor resection on September 21, 2005, under the care of Dr CChristella Noa 5. Status post whole brain irradiation under the care of Dr WElba Barman completed November 07, 2005. 6. Status post gamma knife [facility]. Status post right thyroidectomy with radical neck dissection under the care of Dr. RConstance Holsteron 10/18/2011. 8. Status post excisional biopsy of right cervical lymph node that was consistent with metastatic adenocarcinoma. 9. Tarceva 150 mg by mouth daily with therapy beginning 06/03/2007, discontinued in July 2015 secondary to disease progression. Status post 79 cycles. 10. palliative radiotherapy to the enlarging right upper lobe lung nodule under the care of Dr. MValere Dross [facility]. [id number] 300 mg by mouth daily for 21 days discontinued secondary to study deviation and [facility]. 12. Open reduction and internal  fixation of trimalleolar ankle fracture dislocation with fixation of the fibula as well as medial          malleolus.   CURRENT THERAPY:  1) Tagrisso 80 mg by mouth daily, started 11/25/2014, status post 10 months of treatment.  INTERVAL HISTORY: Valerie FRAY70y.o. female returns to the clinic today for follow up visit. The patient is feeling fine today was no specific complaints. She is currently on treatment with Tagrisso 80 mg by mouth daily for the last 10 months and she is tolerating her treatment fairly well with no significant adverse effects except for intermittent episodes of diarrhea depending on her meals. She denied having any significant skin rash. She denied having any significant chest pain, shortness of breath, cough or hemoptysis. She denied having any significant nausea or vomiting. She had repeat CBC and comprehensive metabolic panel performed earlier today and she is here for evaluation and discussion of her lab results.  MEDICAL HISTORY: Past Medical History  Diagnosis Date  . Heart murmur   . Shortness of breath     hx lung ca   . Arthritis   . Anal fistula   . H/O osteoporosis   . H/O vitamin D deficiency   . History of measles, mumps, or rubella   . H/O varicella   . Hypertension   . Yeast infection   . Abnormal Pap smear 2006  . Atrophic vaginitis 2008  . Dyspareunia 2008  . Dementia 2009  . Osteoporosis 2010  . Status post chemotherapy 2003    CARBOPLATIN/PACLITAXEL /STATUS POST CLINICAL TRAIL OF CELEBREX AND IRESSA AT [facility] FOR 1 YEAR  . Status post radiation  therapy 2003    LEFT LUNG  . Status post radiation therapy 11/07/2005    WHOLE BRAIN: DR Larkin [facility]  . Status post radiation therapy 06/02/2008    GAMMA KNIFE OF RESECTED CAVITAY  . On antineoplastic chemotherapy     TARCEVA  . Nodule of right lung CT- 06/03/12    RIGHT UPPER LOBE  . Thyroid adenoma     ?  [name] Anxiety   . Cataract   . Nodule of right lung 06/03/12    Upper Lobe    . History of radiation therapy 07/28/13- 08/10/13    right lung metastasis 5000 cGy 10 sessions  . Ankle fracture   . Lung cancer ([place]) dx'd 2002  . Metastasis to brain [facility]) dx'd 2008  . 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 Thyroid cancer ([place]) 10/18/11 bx    adenoid nodules   . Lung metastasis ([place])     PET scan 05/05/13, RUL lung nodule  . Lung cancer ([place])   . Lung cancer ([place])   . Primary cancer of right upper lobe of lung ([place]) 04/22/2009    Qualifier: Diagnosis of  By: Nils Pyle CMA (AAMA), Mearl Latin      ALLERGIES:  has No Known Allergies.  MEDICATIONS:  Current Outpatient Prescriptions  Medication Sig Dispense Refill  . alendronate (FOSAMAX) 70 MG tablet Take 1 tablet (70 mg total) by mouth every 7 (seven) days. Saturdays; Take with a full glass of water on an empty stomach. 4 tablet 12  . Ascorbic Acid (VITAMIN C) 100 MG tablet Take 100 mg by mouth daily.    . chlorpheniramine-HYDROcodone (TUSSIONEX) 10-8 MG/5ML SUER Take 5 mLs by mouth every 12 (twelve) hours as needed for cough. 140 mL 0  . cholecalciferol (VITAMIN D) 1000 UNITS tablet Take 1,000 Units by mouth at bedtime.    . donepezil (ARICEPT) 10 MG tablet Take 10 mg by mouth daily.  0  . loperamide (IMODIUM) 2 MG capsule Take 2 mg by mouth as needed for diarrhea or loose stools.     . Multiple Vitamin (MULTI-VITAMINS) TABS Take 1 tablet by mouth daily.     . naproxen sodium (ANAPROX) 220 MG tablet Take 220 mg by mouth 2 (two) times daily as needed (pain). Reported on 09/13/2015    . Osimertinib Mesylate (TAGRISSO PO)   2  . osimertinib mesylate (TAGRISSO) 80 MG tablet Take 80 mg by mouth daily.    . pantoprazole (PROTONIX) 40 MG tablet Take 1 tablet (40 mg total) by mouth daily at 12 noon. (Patient not taking: Reported on 09/13/2015) 10 tablet 0  . TAGRISSO 80 MG tablet TAKE 1 TABLET BY MOUTH ONCE DAILY 30 tablet 2  . XARELTO 20 MG TABS tablet Take 1 tablet (20 mg total) by mouth daily. 30 tablet 0   No current  facility-administered medications for this visit.    SURGICAL HISTORY:  Past Surgical History  Procedure Laterality Date  . Left occipital craniotomy  09/21/2005    tumor  . Left lower lobectomy  05/2001  . Radical neck dissection  10/18/2011    Procedure: RADICAL NECK DISSECTION;  Surgeon: Izora Gala, MD;  Location: [place];  Service: ENT;  Laterality: Right;  RIGHT MODIFIED NECK DISSECTION /POSSIBLE RIGHT THYROIDECTOM  . Thyroidectomy  10/18/2011    Procedure: THYROIDECTOMY WITH RADICAL NECK DISSECTION;  Surgeon: Izora Gala, MD;  Location: [place];  Service: ENT;  Laterality: Right;  . Lung lobectomy    . Neck surgery    .  Brain surgery    . Orif ankle fracture Left 10/02/2014    Procedure: OPEN REDUCTION INTERNAL FIXATION (ORIF) ANKLE FRACTURE;  Surgeon: Alta Corning, MD;  [facility];  Service: Orthopedics;  Laterality: Left;    REVIEW OF SYSTEMS:  A comprehensive review of systems was negative except for: Constitutional: positive for fatigue Gastrointestinal: positive for diarrhea   PHYSICAL EXAMINATION: General appearance: alert, cooperative, fatigued and no distress Head: Normocephalic, without obvious abnormality, atraumatic Neck: no adenopathy Lymph nodes: Cervical, supraclavicular, and axillary nodes normal. Resp: clear to auscultation bilaterally Cardio: regular rate and rhythm, S1, S2 normal, no murmur, click, rub or gallop GI: soft, non-tender; bowel sounds normal; no masses,  no organomegaly Extremities: extremities normal, atraumatic, no cyanosis or edema Neurologic: Alert and oriented X 3, normal strength and tone. Normal symmetric reflexes. Normal coordination and gait  ECOG PERFORMANCE STATUS: 1 - Symptomatic but completely ambulatory  Blood pressure 117/64, pulse 101, temperature 97.5 F (36.4 C), temperature source Oral, resp. rate 17, height '5\' 7"'$  (1.702 m), weight 188 lb 9.6 oz (85.548 kg), SpO2 99 %.  LABORATORY DATA: Lab Results  Component Value Date    WBC 4.1 10/25/2015   HGB 13.0 10/25/2015   HCT 40.0 10/25/2015   MCV 91.4 10/25/2015   PLT 201 10/25/2015      Chemistry      Component Value Date/Time   NA 140 09/05/2015 1109   NA 137 08/29/2015 0505   NA 138 02/28/2012 0939   K 4.2 09/05/2015 1109   K 3.6 08/29/2015 0505   K 4.6 02/28/2012 0939   CL 102 08/29/2015 0505   CL 106 12/09/2012 0806   CL 99 02/28/2012 0939   CO2 24 09/05/2015 1109   CO2 28 08/29/2015 0505   CO2 28 02/28/2012 0939   BUN 17.4 09/05/2015 1109   BUN 9 08/29/2015 0505   BUN 12 02/28/2012 0939   CREATININE 0.9 09/05/2015 1109   CREATININE 0.49 08/29/2015 0505   CREATININE 1.0 02/28/2012 0939      Component Value Date/Time   CALCIUM 10.0 09/05/2015 1109   CALCIUM 8.8* 08/29/2015 0505   CALCIUM 9.1 02/28/2012 0939   ALKPHOS 58 09/05/2015 1109   ALKPHOS 50 08/27/2015 0553   ALKPHOS 80 02/28/2012 0939   AST 12 09/05/2015 1109   AST 23 08/27/2015 0553   AST 20 02/28/2012 0939   ALT 15 09/05/2015 1109   ALT 21 08/27/2015 0553   ALT 21 02/28/2012 0939   BILITOT 0.57 09/05/2015 1109   BILITOT 1.1 08/27/2015 0553   BILITOT 1.30 02/28/2012 0939       RADIOGRAPHIC STUDIES: No results found. ASSESSMENT AND PLAN: this is a very pleasant 70 years old African American female with metastatic non-small cell lung cancer adenocarcinoma, with positive EGFR mutation but also resistant mutation T790M. She has been on treatment with oral Tarceva for more than 6 years.  The patient has EGFR resistant mutation T790M and was treated for the last 8 months with [id number] on a clinical trial at [facility] but this was discontinued secondary to inability to follow-up with the protocol requirement. She had no evidence for disease progression on that treatment. The patient is currently on Tagrisso 80 mg by mouth daily status post 10 months and tolerating it fairly well with no significant adverse effects.  Her recent CBC showed no concerning findings. Comprehensive  metabolic panel is still pending. I recommended for the patient to continue her current treatment with Tagrisso. I will  see her back for follow-up visit in 4 weeks for reevaluation after repeating CBC and comprehensive metabolic panel. She was advised to call immediately if she has any concerning symptoms in the interval. The patient voices understanding of current disease status and treatment options and is in agreement with the current care plan.  All questions were answered. The patient knows to call the clinic with any problems, questions or concerns. We can certainly see the patient much sooner if necessary.  Disclaimer: This note was dictated with voice recognition software. Similar sounding words can inadvertently be transcribed and may not be corrected upon review.

## 2015-11-03 DIAGNOSIS — E559 Vitamin D deficiency, unspecified: Secondary | ICD-10-CM | POA: Diagnosis not present

## 2015-11-03 DIAGNOSIS — G3184 Mild cognitive impairment, so stated: Secondary | ICD-10-CM | POA: Diagnosis not present

## 2015-11-03 DIAGNOSIS — R7309 Other abnormal glucose: Secondary | ICD-10-CM | POA: Diagnosis not present

## 2015-11-03 DIAGNOSIS — Z79899 Other long term (current) drug therapy: Secondary | ICD-10-CM | POA: Diagnosis not present

## 2015-11-07 MED FILL — *TAGRISSO 80MG TABLET: 80 | 30 days supply | Qty: 30 | Fill #1

## 2015-11-16 DIAGNOSIS — Z0000 Encounter for general adult medical examination without abnormal findings: Secondary | ICD-10-CM | POA: Diagnosis not present

## 2015-11-16 DIAGNOSIS — N390 Urinary tract infection, site not specified: Secondary | ICD-10-CM | POA: Diagnosis not present

## 2015-11-16 DIAGNOSIS — B961 Klebsiella pneumoniae [K. pneumoniae] as the cause of diseases classified elsewhere: Secondary | ICD-10-CM | POA: Diagnosis not present

## 2015-11-16 DIAGNOSIS — R350 Frequency of micturition: Secondary | ICD-10-CM | POA: Diagnosis not present

## 2015-11-17 ENCOUNTER — Telehealth: Payer: Self-pay

## 2015-11-17 NOTE — Telephone Encounter (Signed)
Patient called today regarding going off her xarelto.  She has called her PCP who has not returned her call.  Patient would like to know what Dr. Julien Nordmann thinks and would appreciate a call back today from his nurse today.

## 2015-11-18 ENCOUNTER — Telehealth: Payer: Self-pay | Admitting: Medical Oncology

## 2015-11-18 NOTE — Telephone Encounter (Signed)
-----   Message from Curt Bears, MD sent at 11/17/2015  5:39 PM EDT ----- Regarding: RE: xarelto Continue Xarelto. High risk for DVT and PE. ----- Message -----    From: Ardeen Garland, RN    Sent: 11/17/2015   2:24 PM      To: Curt Bears, MD Subject: xarelto                                        Patient called today regarding going off her xarelto. She has called her PCP who has not returned her call. Patient would like to know what Dr. Julien Nordmann thinks and would appreciate a call back today from his nurse today.  i am not sure what she is on it for. Can she stop it?

## 2015-11-18 NOTE — Telephone Encounter (Signed)
I left a message for pt to stay on xarelto per Dr. Julien Nordmann.

## 2015-11-22 ENCOUNTER — Ambulatory Visit (HOSPITAL_BASED_OUTPATIENT_CLINIC_OR_DEPARTMENT_OTHER): Payer: Medicare Other | Admitting: Internal Medicine

## 2015-11-22 ENCOUNTER — Telehealth: Payer: Self-pay | Admitting: Internal Medicine

## 2015-11-22 ENCOUNTER — Other Ambulatory Visit (HOSPITAL_BASED_OUTPATIENT_CLINIC_OR_DEPARTMENT_OTHER): Payer: Medicare Other

## 2015-11-22 ENCOUNTER — Encounter: Payer: Self-pay | Admitting: Internal Medicine

## 2015-11-22 VITALS — BP 132/65 | HR 83 | Temp 98.6°F | Resp 18 | Ht 67.0 in | Wt 183.9 lb

## 2015-11-22 DIAGNOSIS — C7931 Secondary malignant neoplasm of brain: Secondary | ICD-10-CM | POA: Diagnosis not present

## 2015-11-22 DIAGNOSIS — C3411 Malignant neoplasm of upper lobe, right bronchus or lung: Secondary | ICD-10-CM

## 2015-11-22 DIAGNOSIS — C7801 Secondary malignant neoplasm of right lung: Secondary | ICD-10-CM

## 2015-11-22 DIAGNOSIS — N390 Urinary tract infection, site not specified: Secondary | ICD-10-CM

## 2015-11-22 DIAGNOSIS — Z5111 Encounter for antineoplastic chemotherapy: Secondary | ICD-10-CM

## 2015-11-22 LAB — CBC WITH DIFFERENTIAL/PLATELET
BASO%: 0.6 % (ref 0.0–2.0)
Basophils Absolute: 0 10*3/uL (ref 0.0–0.1)
EOS%: 1.4 % (ref 0.0–7.0)
Eosinophils Absolute: 0.1 10*3/uL (ref 0.0–0.5)
HCT: 41.5 % (ref 34.8–46.6)
HGB: 13.5 g/dL (ref 11.6–15.9)
LYMPH%: 14.6 % (ref 14.0–49.7)
MCH: 29.4 pg (ref 25.1–34.0)
MCHC: 32.5 g/dL (ref 31.5–36.0)
MCV: 90.4 fL (ref 79.5–101.0)
MONO#: 0.3 10*3/uL (ref 0.1–0.9)
MONO%: 8.3 % (ref 0.0–14.0)
NEUT#: 3.1 10*3/uL (ref 1.5–6.5)
NEUT%: 75.1 % (ref 38.4–76.8)
Platelets: 207 10*3/uL (ref 145–400)
RBC: 4.59 10*6/uL (ref 3.70–5.45)
RDW: 13.9 % (ref 11.2–14.5)
WBC: 4.2 10*3/uL (ref 3.9–10.3)
lymph#: 0.6 10*3/uL — ABNORMAL LOW (ref 0.9–3.3)

## 2015-11-22 LAB — COMPREHENSIVE METABOLIC PANEL
ALT: <9 U/L (ref 0–55)
AST: 12 U/L (ref 5–34)
Albumin: 4 g/dL (ref 3.5–5.0)
Alkaline Phosphatase: 60 U/L (ref 40–150)
Anion Gap: 9 mEq/L (ref 3–11)
BUN: 11.8 mg/dL (ref 7.0–26.0)
CO2: 24 mEq/L (ref 22–29)
Calcium: 9.6 mg/dL (ref 8.4–10.4)
Chloride: 108 mEq/L (ref 98–109)
Creatinine: 0.9 mg/dL (ref 0.6–1.1)
EGFR: 71 mL/min/{1.73_m2} — ABNORMAL LOW (ref >90)
Glucose: 84 mg/dl (ref 70–140)
Potassium: 4.2 mEq/L (ref 3.5–5.1)
Sodium: 140 mEq/L (ref 136–145)
Total Bilirubin: 0.61 mg/dL (ref 0.20–1.20)
Total Protein: 7.1 g/dL (ref 6.4–8.3)

## 2015-11-22 NOTE — Telephone Encounter (Signed)
Gave and printed appt sched and avs for pt for April and May °

## 2015-11-22 NOTE — Progress Notes (Signed)
[place] Telephone:([phone]   Fax:(336) [phone]  OFFICE PROGRESS NOTE  [name], MD [address] [place] [address]  DIAGNOSIS: Metastatic non-small cell lung cancer with brain metastasis, diagnosed initially a stage IIB adenocarcinoma in October 2002.  BIOMARKERS TESTING ([facility] One): Positive for: EGFR E746-A750 del, T790M, CTNNB1 D32G, SMAD4 T117f*10 Negative for: RET, ALK, BRAF, KRAS, ERBB2 and MET  PRIOR THERAPY:  1. Status post left lower lobectomy under the care of Dr BArlyce Dicein October 2002. 2. Status post course of concurrent chemoradiation with weekly carboplatin and paclitaxel under the care of Dr SBenay Spiceand Dr WElba Barmanin early 2003. 3. Status post [facility] 4. Status post left occipital craniotomy for tumor resection on September 21, 2005, under the care of Dr CChristella Noa 5. Status post whole brain irradiation under the care of Dr WElba Barman completed November 07, 2005. 6. Status post gamma knife treatment to the rese[facility]. Status post right thyroidectomy with radical neck dissection under the care of Dr. RConstance Holsteron 10/18/2011. 8. Status post excisional biopsy of right cervical lymph node that was consistent with metastatic adenocarcinoma. 9. Tarceva 150 mg by mouth daily with therapy beginning 06/03/2007, discontinued in July 2015 secondary to disease progression. Status post 79 cycles. 10. palliative radiotherapy to the enlarging right upper lobe lung nodule under the care of Dr. MValere Dross 11. [id number] 300 mg by mouth daily for 21 days discontinued secondary to study deviation [facility]. 12. Open reduction and internal  fixation of trimalleolar ankle fracture dislocation with fixation of the fibula as well as medial          malleolus.   CURRENT THERAPY:  1) Tagrisso 80 mg by mouth daily, started 11/25/2014, status post 11 months of treatment.  INTERVAL HISTORY: Valerie LEONHART70y.o. female returns to the clinic today for follow up visit. The patient is feeling fine today with no specific complaints. She is currently on treatment with Tagrisso 80 mg by mouth daily for the last 71monthand she is tolerating her treatment fairly well with no significant adverse effects. She was seen by urology recently and treated for urinary tract infection. She denied having any significant skin rash. She denied having any significant chest pain, shortness of breath, cough or hemoptysis. She denied having any significant nausea or vomiting. She had repeat CBC and comprehensive metabolic panel performed earlier today and she is here for evaluation and discussion of her lab results.  MEDICAL HISTORY: Past Medical History  Diagnosis Date  . Heart murmur   . Shortness of breath     hx lung ca   . Arthritis   . Anal fistula   . H/O osteoporosis   . H/O vitamin D deficiency   . History of measles, mumps, or rubella   . H/O varicella   . Hypertension   . Yeast infection   . Abnormal Pap smear 2006  . Atrophic vaginitis 2008  . Dyspareunia 2008  . Dementia 2009  . Osteoporosis 2010  . Status post chemotherapy 2003    CARBOPLATIN/PACLITAXEL /STATUS POST CLINICAL TRAIL OF CELEBREX AND IRESSA AT [facility] FOR 1 YEAR  . Status post  radiation therapy 2003    LEFT LUNG  . Status post radiation therapy 11/07/2005    WHOLE BRAIN: DR Larkin [facility]  . Status post radiation therapy 06/02/2008    GAMMA KNIFE OF RESECTED CAVITAY  . On antineoplastic chemotherapy     TARCEVA  . Nodule of right lung CT- 06/03/12    RIGHT UPPER LOBE  . Thyroid adenoma     ?  [name] Anxiety   . Cataract   . Nodule of right lung 06/03/12    Upper  Lobe  . History of radiation therapy 07/28/13- 08/10/13    right lung metastasis 5000 cGy 10 sessions  . Ankle fracture   . Lung cancer ([place]) dx'd 2002  . Metastasis to brain [facility]) dx'd 2008  . 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 Thyroid cancer ([place]) 10/18/11 bx    adenoid nodules   . Lung metastasis ([place])     PET scan 05/05/13, RUL lung nodule  . Lung cancer ([place])   . Lung cancer ([place])   . Primary cancer of right upper lobe of lung ([place]) 04/22/2009    Qualifier: Diagnosis of  By: Nils Pyle CMA (AAMA), Mearl Latin      ALLERGIES:  has No Known Allergies.  MEDICATIONS:  Current Outpatient Prescriptions  Medication Sig Dispense Refill  . alendronate (FOSAMAX) 70 MG tablet Take 1 tablet (70 mg total) by mouth every 7 (seven) days. Saturdays; Take with a full glass of water on an empty stomach. 4 tablet 12  . Ascorbic Acid (VITAMIN C) 100 MG tablet Take 100 mg by mouth daily.    . cephALEXin (KEFLEX) 500 MG capsule     . cholecalciferol (VITAMIN D) 1000 UNITS tablet Take 1,000 Units by mouth at bedtime.    . donepezil (ARICEPT) 10 MG tablet Take 10 mg by mouth daily.  0  . loperamide (IMODIUM) 2 MG capsule Take 2 mg by mouth as needed for diarrhea or loose stools.     . Multiple Vitamin (MULTI-VITAMINS) TABS Take 1 tablet by mouth daily.     . Osimertinib Mesylate (TAGRISSO PO)   2  . osimertinib mesylate (TAGRISSO) 80 MG tablet Take 80 mg by mouth daily.    . naproxen sodium (ANAPROX) 220 MG tablet Take 220 mg by mouth 2 (two) times daily as needed (pain). Reported on 11/22/2015     No current facility-administered medications for this visit.    SURGICAL HISTORY:  Past Surgical History  Procedure Laterality Date  . Left occipital craniotomy  09/21/2005    tumor  . Left lower lobectomy  05/2001  . Radical neck dissection  10/18/2011    Procedure: RADICAL NECK DISSECTION;  Surgeon: Izora Gala, MD;  [facility];  Service: ENT;  Laterality: Right;  RIGHT MODIFIED NECK DISSECTION  /POSSIBLE RIGHT THYROIDECTOM  . Thyroidectomy  10/18/2011    Procedure: THYROIDECTOMY WITH RADICAL NECK DISSECTION;  Surgeon: Izora Gala, MD;  Location: [place];  Service: ENT;  Laterality: Right;  . Lung lobectomy    . Neck surgery    . Brain surgery    . Orif ankle fracture Left 10/02/2014    Procedure: OPEN REDUCTION INTERNAL FIXATION (ORIF) ANKLE FRACTURE;  Surgeon: Alta Corning, MD;  [facility];  Service: Orthopedics;  Laterality: Left;    REVIEW OF SYSTEMS:  A comprehensive review of systems was negative except for: Constitutional: positive for fatigue   PHYSICAL EXAMINATION: General appearance: alert, cooperative, fatigued and no distress Head: Normocephalic, without obvious  abnormality, atraumatic Neck: no adenopathy Lymph nodes: Cervical, supraclavicular, and axillary nodes normal. Resp: clear to auscultation bilaterally Cardio: regular rate and rhythm, S1, S2 normal, no murmur, click, rub or gallop GI: soft, non-tender; bowel sounds normal; no masses,  no organomegaly Extremities: extremities normal, atraumatic, no cyanosis or edema Neurologic: Alert and oriented X 3, normal strength and tone. Normal symmetric reflexes. Normal coordination and gait  ECOG PERFORMANCE STATUS: 1 - Symptomatic but completely ambulatory  Blood pressure 132/65, pulse 83, temperature 98.6 F (37 C), temperature source Oral, resp. rate 18, height '5\' 7"'$  (1.702 m), weight 183 lb 14.4 oz (83.416 kg), SpO2 99 %.  LABORATORY DATA: Lab Results  Component Value Date   WBC 4.2 11/22/2015   HGB 13.5 11/22/2015   HCT 41.5 11/22/2015   MCV 90.4 11/22/2015   PLT 207 11/22/2015      Chemistry      Component Value Date/Time   NA 139 10/25/2015 0958   NA 137 08/29/2015 0505   NA 138 02/28/2012 0939   K 3.9 10/25/2015 0958   K 3.6 08/29/2015 0505   K 4.6 02/28/2012 0939   CL 102 08/29/2015 0505   CL 106 12/09/2012 0806   CL 99 02/28/2012 0939   CO2 24 10/25/2015 0958   CO2 28 08/29/2015 0505     CO2 28 02/28/2012 0939   BUN 11.7 10/25/2015 0958   BUN 9 08/29/2015 0505   BUN 12 02/28/2012 0939   CREATININE 0.9 10/25/2015 0958   CREATININE 0.49 08/29/2015 0505   CREATININE 1.0 02/28/2012 0939      Component Value Date/Time   CALCIUM 9.3 10/25/2015 0958   CALCIUM 8.8* 08/29/2015 0505   CALCIUM 9.1 02/28/2012 0939   ALKPHOS 58 10/25/2015 0958   ALKPHOS 50 08/27/2015 0553   ALKPHOS 80 02/28/2012 0939   AST 12 10/25/2015 0958   AST 23 08/27/2015 0553   AST 20 02/28/2012 0939   ALT 11 10/25/2015 0958   ALT 21 08/27/2015 0553   ALT 21 02/28/2012 0939   BILITOT 0.59 10/25/2015 0958   BILITOT 1.1 08/27/2015 0553   BILITOT 1.30 02/28/2012 0939       RADIOGRAPHIC STUDIES: No results found. ASSESSMENT AND PLAN: this is a very pleasant 70 years old African American female with metastatic non-small cell lung cancer adenocarcinoma, with positive EGFR mutation but also resistant mutation T790M. She has been on treatment with oral Tarceva for more than 6 years.  The patient has EGFR resistant mutation T790M and was treated for the last 8 months with [id number] on a clinical trial at [facility] but this was discontinued secondary to inability to follow-up with the protocol requirement. She had no evidence for disease progression on that treatment. The patient is currently on Tagrisso 80 mg by mouth daily status post 11 months and tolerating it fairly well with no significant adverse effects.  Her recent CBC showed no concerning findings. Comprehensive metabolic panel is still pending. I recommended for the patient to continue her current treatment with Tagrisso. I will see her back for follow-up visit in 4 weeks for reevaluation after repeating CBC and comprehensive metabolic panel as well as CT scan of the chest, abdomen and pelvis for restaging of her disease. For the urinary tract infection, the patient will continue her treatment as prescribed by radiology. She was advised to call  immediately if she has any concerning symptoms in the interval. The patient voices understanding of current disease status and treatment options and is  in agreement with the current care plan.  All questions were answered. The patient knows to call the clinic with any problems, questions or concerns. We can certainly see the patient much sooner if necessary.  Disclaimer: This note was dictated with voice recognition software. Similar sounding words can inadvertently be transcribed and may not be corrected upon review.

## 2015-11-23 ENCOUNTER — Other Ambulatory Visit: Payer: Self-pay | Admitting: Medical Oncology

## 2015-11-23 DIAGNOSIS — C3411 Malignant neoplasm of upper lobe, right bronchus or lung: Secondary | ICD-10-CM

## 2015-11-23 DIAGNOSIS — C7801 Secondary malignant neoplasm of right lung: Secondary | ICD-10-CM

## 2015-11-24 ENCOUNTER — Telehealth: Payer: Self-pay | Admitting: Internal Medicine

## 2015-11-24 NOTE — Telephone Encounter (Signed)
returned call and confirmed 4.25 lab appt cx and moved to 4.28

## 2015-11-25 ENCOUNTER — Telehealth: Payer: Self-pay | Admitting: Internal Medicine

## 2015-11-25 NOTE — Telephone Encounter (Signed)
returned call no answer

## 2015-12-06 DIAGNOSIS — Z0000 Encounter for general adult medical examination without abnormal findings: Secondary | ICD-10-CM | POA: Diagnosis not present

## 2015-12-06 DIAGNOSIS — B9620 Unspecified Escherichia coli [E. coli] as the cause of diseases classified elsewhere: Secondary | ICD-10-CM | POA: Diagnosis not present

## 2015-12-06 DIAGNOSIS — R350 Frequency of micturition: Secondary | ICD-10-CM | POA: Diagnosis not present

## 2015-12-06 DIAGNOSIS — N390 Urinary tract infection, site not specified: Secondary | ICD-10-CM | POA: Diagnosis not present

## 2015-12-13 ENCOUNTER — Other Ambulatory Visit: Payer: Self-pay

## 2015-12-16 ENCOUNTER — Other Ambulatory Visit (HOSPITAL_BASED_OUTPATIENT_CLINIC_OR_DEPARTMENT_OTHER): Payer: Medicare Other

## 2015-12-16 ENCOUNTER — Ambulatory Visit (HOSPITAL_COMMUNITY)
Admission: RE | Admit: 2015-12-16 | Discharge: 2015-12-16 | Disposition: A | Discharge status: Home / Self Care | Payer: Medicare Other | Source: Ambulatory Visit | Attending: Internal Medicine | Admitting: Internal Medicine

## 2015-12-16 ENCOUNTER — Encounter (HOSPITAL_COMMUNITY): Payer: Self-pay

## 2015-12-16 DIAGNOSIS — R911 Solitary pulmonary nodule: Secondary | ICD-10-CM | POA: Insufficient documentation

## 2015-12-16 DIAGNOSIS — C7801 Secondary malignant neoplasm of right lung: Secondary | ICD-10-CM | POA: Insufficient documentation

## 2015-12-16 DIAGNOSIS — N390 Urinary tract infection, site not specified: Secondary | ICD-10-CM | POA: Diagnosis not present

## 2015-12-16 DIAGNOSIS — E890 Postprocedural hypothyroidism: Secondary | ICD-10-CM | POA: Diagnosis not present

## 2015-12-16 DIAGNOSIS — Z902 Acquired absence of lung [part of]: Secondary | ICD-10-CM | POA: Diagnosis not present

## 2015-12-16 DIAGNOSIS — C3411 Malignant neoplasm of upper lobe, right bronchus or lung: Secondary | ICD-10-CM | POA: Diagnosis not present

## 2015-12-16 DIAGNOSIS — Z5111 Encounter for antineoplastic chemotherapy: Secondary | ICD-10-CM

## 2015-12-16 DIAGNOSIS — J90 Pleural effusion, not elsewhere classified: Secondary | ICD-10-CM | POA: Insufficient documentation

## 2015-12-16 DIAGNOSIS — Z9221 Personal history of antineoplastic chemotherapy: Secondary | ICD-10-CM | POA: Diagnosis not present

## 2015-12-16 DIAGNOSIS — C3492 Malignant neoplasm of unspecified part of left bronchus or lung: Secondary | ICD-10-CM | POA: Diagnosis not present

## 2015-12-16 DIAGNOSIS — C3432 Malignant neoplasm of lower lobe, left bronchus or lung: Secondary | ICD-10-CM | POA: Diagnosis not present

## 2015-12-16 DIAGNOSIS — J910 Malignant pleural effusion: Secondary | ICD-10-CM | POA: Diagnosis not present

## 2015-12-16 DIAGNOSIS — K7689 Other specified diseases of liver: Secondary | ICD-10-CM | POA: Diagnosis not present

## 2015-12-16 LAB — CBC WITH DIFFERENTIAL/PLATELET
BASO%: 0.3 % (ref 0.0–2.0)
Basophils Absolute: 0 10*3/uL (ref 0.0–0.1)
EOS%: 2.2 % (ref 0.0–7.0)
Eosinophils Absolute: 0.1 10*3/uL (ref 0.0–0.5)
HCT: 37.9 % (ref 34.8–46.6)
HGB: 12.3 g/dL (ref 11.6–15.9)
LYMPH%: 20.1 % (ref 14.0–49.7)
MCH: 29.4 pg (ref 25.1–34.0)
MCHC: 32.5 g/dL (ref 31.5–36.0)
MCV: 90.7 fL (ref 79.5–101.0)
MONO#: 0.5 10*3/uL (ref 0.1–0.9)
MONO%: 13.4 % (ref 0.0–14.0)
NEUT#: 2.3 10*3/uL (ref 1.5–6.5)
NEUT%: 64 % (ref 38.4–76.8)
Platelets: 195 10*3/uL (ref 145–400)
RBC: 4.18 10*6/uL (ref 3.70–5.45)
RDW: 13.6 % (ref 11.2–14.5)
WBC: 3.6 10*3/uL — ABNORMAL LOW (ref 3.9–10.3)
lymph#: 0.7 10*3/uL — ABNORMAL LOW (ref 0.9–3.3)

## 2015-12-16 LAB — COMPREHENSIVE METABOLIC PANEL
ALT: 12 U/L (ref 0–55)
AST: 12 U/L (ref 5–34)
Albumin: 3.8 g/dL (ref 3.5–5.0)
Alkaline Phosphatase: 61 U/L (ref 40–150)
Anion Gap: 9 mEq/L (ref 3–11)
BUN: 17.1 mg/dL (ref 7.0–26.0)
CO2: 23 mEq/L (ref 22–29)
Calcium: 9.7 mg/dL (ref 8.4–10.4)
Chloride: 106 mEq/L (ref 98–109)
Creatinine: 1 mg/dL (ref 0.6–1.1)
EGFR: 65 mL/min/{1.73_m2} — ABNORMAL LOW (ref >90)
Glucose: 92 mg/dl (ref 70–140)
Potassium: 3.9 mEq/L (ref 3.5–5.1)
Sodium: 138 mEq/L (ref 136–145)
Total Bilirubin: 0.65 mg/dL (ref 0.20–1.20)
Total Protein: 6.8 g/dL (ref 6.4–8.3)

## 2015-12-16 MED ORDER — DIATRIZOATE MEGLUMINE & SODIUM 66-10 % PO SOLN
30.0000 mL | Freq: Once | ORAL | Status: AC
  Administered 2015-12-16: 30 mL via ORAL

## 2015-12-16 MED ORDER — IOPAMIDOL (ISOVUE-300) INJECTION 61%
100.0000 mL | Freq: Once | INTRAVENOUS | Status: AC | PRN
  Administered 2015-12-16: 100 mL via INTRAVENOUS

## 2015-12-16 MED FILL — *TAGRISSO 80MG TABLET: 80 | 30 days supply | Qty: 30 | Fill #2

## 2015-12-20 ENCOUNTER — Telehealth: Payer: Self-pay | Admitting: Internal Medicine

## 2015-12-20 ENCOUNTER — Encounter: Payer: Self-pay | Admitting: Internal Medicine

## 2015-12-20 ENCOUNTER — Ambulatory Visit (HOSPITAL_BASED_OUTPATIENT_CLINIC_OR_DEPARTMENT_OTHER): Payer: Medicare Other | Admitting: Internal Medicine

## 2015-12-20 VITALS — BP 141/70 | HR 95 | Temp 97.6°F | Resp 18 | Ht 67.0 in | Wt 182.4 lb

## 2015-12-20 DIAGNOSIS — C7931 Secondary malignant neoplasm of brain: Secondary | ICD-10-CM

## 2015-12-20 DIAGNOSIS — C3411 Malignant neoplasm of upper lobe, right bronchus or lung: Secondary | ICD-10-CM

## 2015-12-20 DIAGNOSIS — Z5111 Encounter for antineoplastic chemotherapy: Secondary | ICD-10-CM

## 2015-12-20 DIAGNOSIS — N390 Urinary tract infection, site not specified: Secondary | ICD-10-CM | POA: Diagnosis not present

## 2015-12-20 NOTE — Telephone Encounter (Signed)
Gave and printed appt sched and avs for pt for July  °

## 2015-12-20 NOTE — Progress Notes (Signed)
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  BIOMARKERS TESTING ([facility]): Positive for: EGFR [id number] del, T790M, CTNNB1 D32G, SMAD4 T119f*10 Negative for: RET, ALK, BRAF, KRAS, ERBB2 and MET  PRIOR THERAPY:  1. Status post left lower lobectomy under the care of Dr BArlyce Dicein October 2002. 2. Status post course of concurrent chemoradiation with weekly carboplatin and paclitaxel under the care of Dr SBenay Spiceand Dr WElba Barmanin early 2003. 3. Status post treatment with Celebrex and [facility]. 4. Status post left occipital craniotomy for tumor resection on September 21, 2005, under the care of Dr CChristella Noa 5. Status post whole brain irradiation under the care of Dr WElba Barman completed November 07, 2005. 6. Status post gamma knife treatment [facility] the [facility]cted [facility]. Status post right thyroidectomy with radical neck dissection under the care of Dr. RConstance Holsteron 10/18/2011. 8. Status post excisional biopsy of right cervical lymph node that was consistent with metastatic adenocarcinoma. 9. Tarceva 150 mg by mouth daily with therapy beginning 06/03/2007, discontinued in July 2015 secondary to disease progression. Status post 79 cycles. 10. palliative radiotherapy to the enlarging right upper lobe lung nodule under the care of Dr. MValere Dross [facility]. [id number] 300 mg by mouth daily for 21 days discontinued secondary to study deviation and [facility]. 12. Open reduction and internal  fixation of trimalleolar ankle fracture dislocation with fixation of the fibula as well as medial          malleolus.   CURRENT THERAPY:  1) Tagrisso 80 mg by mouth daily, started 11/25/2014, status post 12 months of treatment.  INTERVAL HISTORY: Valerie ROLLAND70y.o. female returns to the clinic today for follow up visit. The patient is feeling fine today with no specific complaints. She is currently under treatment for urinary tract infection by Dr. TGaynelle Arabianand treated with Bactrim. She has some nausea and diarrhea with the recent Bactrim treatment. She is currently on treatment with Tagrisso 80 mg by mouth daily for the last 71monthand she is tolerating her treatment fairly well with no significant adverse effects. She denied having any significant skin rash. She denied having any significant chest pain, shortness of breath, cough or hemoptysis. She denied having any significant nausea or vomiting. She had repeat CBC and comprehensive metabolic panel as well as CT scan of the neck, chest, abdomen and pelvis performed recently and she is here for evaluation and discussion of her scan results.  MEDICAL HISTORY: Past Medical History  Diagnosis Date  . Heart murmur   . Shortness of breath     hx lung ca   . Arthritis   . Anal fistula   . H/O osteoporosis   . H/O vitamin D deficiency   . History of measles, mumps, or rubella   . H/O varicella   . Hypertension   . Yeast infection   . Abnormal Pap smear 2006  . Atrophic vaginitis 2008  . Dyspareunia 2008  . Dementia 2009  . Osteoporosis 2010  .  Status post chemotherapy 2003    CARBOPLATIN/PACLITAXEL /STATUS POST CLINICAL TRAIL OF CELEBREX AND IRESSA AT [facility] FOR 1 YEAR  . Status post radiation therapy 2003    LEFT LUNG  . Status post radiation therapy 11/07/2005    WHOLE BRAIN: DR Larkin In[facility]  . Status post radiation therapy 06/02/2008    GAMMA KNIFE OF RESECTED CAVITAY  . On antineoplastic chemotherapy     TARCEVA  .  Nodule of right lung CT- 06/03/12    RIGHT UPPER LOBE  . Thyroid adenoma     ?  [name] Anxiety   . Cataract   . Nodule of right lung 06/03/12    Upper Lobe  . History of radiation therapy 07/28/13- 08/10/13    right lung metastasis 5000 cGy 10 sessions  . Ankle fracture   . Lung cancer ([place]) dx'd 2002  . Metastasis to brain [facility]) dx'd 2008  . 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 Thyroid cancer ([place]) 10/18/11 bx    adenoid nodules   . Lung metastasis ([place])     PET scan 05/05/13, RUL lung nodule  . Lung cancer ([place])   . Lung cancer ([place])   . Primary cancer of right upper lobe of lung ([place]) 04/22/2009    Qualifier: Diagnosis of  By: Nils Pyle CMA (AAMA), Mearl Latin      ALLERGIES:  has No Known Allergies.  MEDICATIONS:  Current Outpatient Prescriptions  Medication Sig Dispense Refill  . alendronate (FOSAMAX) 70 MG tablet Take 1 tablet (70 mg total) by mouth every 7 (seven) days. Saturdays; Take with a full glass of water on an empty stomach. 4 tablet 12  . Ascorbic Acid (VITAMIN C) 100 MG tablet Take 100 mg by mouth daily.    . cholecalciferol (VITAMIN D) 1000 UNITS tablet Take 1,000 Units by mouth at bedtime.    . donepezil (ARICEPT) 10 MG tablet Take 10 mg by mouth daily.  0  . loperamide (IMODIUM) 2 MG capsule Take 2 mg by mouth as needed for diarrhea or loose stools.     . Multiple Vitamin (MULTI-VITAMINS) TABS Take 1 tablet by mouth daily.     . naproxen sodium (ANAPROX) 220 MG tablet Take 220 mg by mouth 2 (two) times daily as needed (pain). Reported on 11/22/2015    . Osimertinib Mesylate (TAGRISSO PO)   2  . osimertinib mesylate (TAGRISSO) 80 MG tablet Take 80 mg by mouth daily.    [name] sulfamethoxazole-trimethoprim (BACTRIM DS,SEPTRA DS) 800-160 MG tablet     . trimethoprim (TRIMPEX) 100 MG tablet     . XARELTO 20 MG TABS tablet     . cephALEXin (KEFLEX) 500 MG capsule      No current facility-administered medications for this visit.    SURGICAL HISTORY:  Past Surgical  History  Procedure Laterality Date  . Left occipital craniotomy  09/21/2005    tumor  . Left lower lobectomy  05/2001  . Radical neck dissection  10/18/2011    Procedure: RADICAL NECK DISSECTION;  Surgeon: Izora Gala, MD;  Location: [place];  Service: ENT;  Laterality: Right;  RIGHT MODIFIED NECK DISSECTION /POSSIBLE RIGHT THYROIDECTOM  . Thyroidectomy  10/18/2011    Procedure: THYROIDECTOMY WITH RADICAL NECK DISSECTION;  Surgeon: Izora Gala, MD;  Location: [facility];  Service: ENT;  Laterality: Right;  . Lung lobectomy    . Neck surgery    . Brain surgery    . Orif ankle fracture Left 10/02/2014    Procedure: OPEN REDUCTION  INTERNAL FIXATION (ORIF) ANKLE FRACTURE;  Surgeon: Alta Corning, MD;  Location: [facility];  Service: Orthopedics;  Laterality: Left;    REVIEW OF SYSTEMS:  Constitutional: positive for fatigue Eyes: negative Ears, nose, mouth, throat, and face: negative Respiratory: negative Cardiovascular: negative Gastrointestinal: positive for diarrhea and nausea Genitourinary:positive for dysuria Integument/breast: negative Hematologic/lymphatic: negative Musculoskeletal:negative Neurological: negative Behavioral/Psych: negative Endocrine: negative Allergic/Immunologic: negative   PHYSICAL EXAMINATION: General appearance: alert, cooperative, fatigued and no distress Head: Normocephalic, without obvious abnormality, atraumatic Neck: no adenopathy Lymph nodes: Cervical, supraclavicular, and axillary nodes normal. Resp: clear to auscultation bilaterally Back: symmetric, no curvature. ROM normal. No CVA tenderness. Cardio: regular rate and rhythm, S1, S2 normal, no murmur, click, rub or gallop GI: soft, non-tender; bowel sounds normal; no masses,  no organomegaly Extremities: extremities normal, atraumatic, no cyanosis or edema Neurologic: Alert and oriented X 3, normal strength and tone. Normal symmetric reflexes. Normal coordination and gait  ECOG PERFORMANCE STATUS: 1 -  Symptomatic but completely ambulatory  There were no vitals taken for this visit.  LABORATORY DATA: Lab Results  Component Value Date   WBC 3.6* 12/16/2015   HGB 12.3 12/16/2015   HCT 37.9 12/16/2015   MCV 90.7 12/16/2015   PLT 195 12/16/2015      Chemistry      Component Value Date/Time   NA 138 12/16/2015 0906   NA 137 08/29/2015 0505   NA 138 02/28/2012 0939   K 3.9 12/16/2015 0906   K 3.6 08/29/2015 0505   K 4.6 02/28/2012 0939   CL 102 08/29/2015 0505   CL 106 12/09/2012 0806   CL 99 02/28/2012 0939   CO2 23 12/16/2015 0906   CO2 28 08/29/2015 0505   CO2 28 02/28/2012 0939   BUN 17.1 12/16/2015 0906   BUN 9 08/29/2015 0505   BUN 12 02/28/2012 0939   CREATININE 1.0 12/16/2015 0906   CREATININE 0.49 08/29/2015 0505   CREATININE 1.0 02/28/2012 0939      Component Value Date/Time   CALCIUM 9.7 12/16/2015 0906   CALCIUM 8.8* 08/29/2015 0505   CALCIUM 9.1 02/28/2012 0939   ALKPHOS 61 12/16/2015 0906   ALKPHOS 50 08/27/2015 0553   ALKPHOS 80 02/28/2012 0939   AST 12 12/16/2015 0906   AST 23 08/27/2015 0553   AST 20 02/28/2012 0939   ALT 12 12/16/2015 0906   ALT 21 08/27/2015 0553   ALT 21 02/28/2012 0939   BILITOT 0.65 12/16/2015 0906   BILITOT 1.1 08/27/2015 0553   BILITOT 1.30 02/28/2012 0939       RADIOGRAPHIC STUDIES: Ct Soft Tissue Neck W Contrast  12/16/2015  CLINICAL DATA:  Metastatic non-small-cell lung cancer. Prior radiation therapy. Ongoing medical therapy. History of thyroid cancer status post surgery. EXAM: CT NECK WITH CONTRAST TECHNIQUE: Multidetector CT imaging of the neck was performed using the standard protocol following the bolus administration of intravenous contrast. CONTRAST:  139m ISOVUE-300 IOPAMIDOL (ISOVUE-300) INJECTION 61% COMPARISON:  05/25/2015 FINDINGS: Pharynx and larynx: No pharyngeal lesion identified within limitations of metallic dental streak artifact. Larynx is unremarkable. Salivary glands: Unremarkable appearance of the  submandibular glands and left parotid gland. Right parotid fatty atrophy is unchanged. Thyroid: Prior right hemithyroidectomy again noted. Sub 5 mm hypodense nodules in the left lobe are grossly unchanged. Lymph nodes: No enlarged lymph nodes are identified in the neck. Vascular: Patent carotid artery wrists with mild non stenotic plaque. Limited venous assessment due to contrast timing. Right internal jugular vein is absent. Limited intracranial: Prior left  occipital craniotomy. Visualized orbits: Not imaged. Mastoids and visualized paranasal sinuses: No acute abnormality. Skeleton: No suspicious lytic or blastic osseous lesion identified. Upper chest: Evaluated on concurrent dedicated chest CT. IMPRESSION: No evidence of metastatic disease in the neck. Electronically Signed   By: Logan Bores M.D.   On: 12/16/2015 13:10   Ct Chest W Contrast  12/16/2015  CLINICAL DATA:  Metastatic non-small-cell lung cancer, Tarceva ongoing, status post gamma knife in 2008 and XRT in 2015. Thyroid cancer in 2013, status post surgery. Pneumonia 1 month ago, diarrhea, overactive bladder. EXAM: CT CHEST, ABDOMEN, AND PELVIS WITH CONTRAST TECHNIQUE: Multidetector CT imaging of the chest, abdomen and pelvis was performed following the standard protocol during bolus administration of intravenous contrast. CONTRAST:  143m ISOVUE-300 IOPAMIDOL (ISOVUE-300) INJECTION 61% COMPARISON:  08/24/2015 FINDINGS: CT CHEST FINDINGS Mediastinum/Nodes: The heart is top-normal in size. No pericardial effusion. No evidence of thoracic aortic aneurysm. No suspicious mediastinal lymphadenopathy. Status post right thyroidectomy. Lungs/Pleura: Status post left lower lobectomy with associated volume loss. Radiation changes in the left paramediastinal region. Small left pleural effusion. Focal radiation changes/opacity along the medial right upper lobe (series 9/ image 54). While this is mildly more conspicuous on axial imaging when compared to the recent  prior, on coronal imaging (series 605/ image 85) this is entirely unchanged. 4 mm triangular subpleural nodule in the lateral right upper lobe (series 9/image 53), grossly unchanged. 5 mm nodule in the left lower lobe (series 9/ image 39), obscured by prior patchy left lower lobe opacity, possibly post infectious. Underlying mild emphysematous changes. No pneumothorax. Musculoskeletal: Mild degenerative changes of the mid thoracic spine. Stable sternal deformity with buckling (series 606/ image 103). CT ABDOMEN PELVIS FINDINGS Hepatobiliary: Liver is notable for a 4 mm cyst in the left hepatic lobe (series 2/ image 57). No suspicious/enhancing hepatic lesions. Gallbladder is grossly unremarkable, noting motion degradation. Pancreas: Grossly unremarkable. Spleen: Within normal limits Adrenals/Urinary Tract: Mild thickening of the left adrenal gland, without discrete mass. Right adrenal gland is within normal limits. Kidneys are within normal limits, noting motion degradation. No hydronephrosis. Bladder is mildly thick-walled although underdistended. Stomach/Bowel: Stomach is grossly unremarkable. Visualized bowel is within normal limits. Normal appendix (series 2/ image 105). Vascular/Lymphatic: No evidence of abdominal aortic aneurysm. No suspicious abdominopelvic lymphadenopathy. Reproductive: Stable heterogeneous appearance of the retroverted uterus (series 606/ image 89), suggesting uterine fibroids. Bilateral ovaries are within normal limits. Other: No abdominopelvic ascites. Musculoskeletal: Mild to moderate superior endplate compression fracture deformity at L2. Mild degenerative changes of the lower lumbar spine. IMPRESSION: Status post left lower lobectomy. Stable postradiation changes in the left paramediastinal region. Small left pleural effusion. Prior patchy left lower lobe opacity has resolved, with a residual 5 mm left lower lobe nodule, attention on follow-up suggested. Stable focal radiation changes  in the right upper lobe. No findings suspicious for metastatic disease. Status post right thyroidectomy. Additional stable ancillary findings as above. Electronically Signed   By: SJulian HyM.D.   On: 12/16/2015 12:11   Ct Abdomen Pelvis W Contrast  12/16/2015  CLINICAL DATA:  Metastatic non-small-cell lung cancer, Tarceva ongoing, status post gamma knife in 2008 and XRT in 2015. Thyroid cancer in 2013, status post surgery. Pneumonia 1 month ago, diarrhea, overactive bladder. EXAM: CT CHEST, ABDOMEN, AND PELVIS WITH CONTRAST TECHNIQUE: Multidetector CT imaging of the chest, abdomen and pelvis was performed following the standard protocol during bolus administration of intravenous contrast. CONTRAST:  1038mISOVUE-300 IOPAMIDOL (ISOVUE-300) INJECTION 61% COMPARISON:  08/24/2015 FINDINGS:  CT CHEST FINDINGS Mediastinum/Nodes: The heart is top-normal in size. No pericardial effusion. No evidence of thoracic aortic aneurysm. No suspicious mediastinal lymphadenopathy. Status post right thyroidectomy. Lungs/Pleura: Status post left lower lobectomy with associated volume loss. Radiation changes in the left paramediastinal region. Small left pleural effusion. Focal radiation changes/opacity along the medial right upper lobe (series 9/ image 54). While this is mildly more conspicuous on axial imaging when compared to the recent prior, on coronal imaging (series 605/ image 85) this is entirely unchanged. 4 mm triangular subpleural nodule in the lateral right upper lobe (series 9/image 53), grossly unchanged. 5 mm nodule in the left lower lobe (series 9/ image 39), obscured by prior patchy left lower lobe opacity, possibly post infectious. Underlying mild emphysematous changes. No pneumothorax. Musculoskeletal: Mild degenerative changes of the mid thoracic spine. Stable sternal deformity with buckling (series 606/ image 103). CT ABDOMEN PELVIS FINDINGS Hepatobiliary: Liver is notable for a 4 mm cyst in the left  hepatic lobe (series 2/ image 57). No suspicious/enhancing hepatic lesions. Gallbladder is grossly unremarkable, noting motion degradation. Pancreas: Grossly unremarkable. Spleen: Within normal limits Adrenals/Urinary Tract: Mild thickening of the left adrenal gland, without discrete mass. Right adrenal gland is within normal limits. Kidneys are within normal limits, noting motion degradation. No hydronephrosis. Bladder is mildly thick-walled although underdistended. Stomach/Bowel: Stomach is grossly unremarkable. Visualized bowel is within normal limits. Normal appendix (series 2/ image 105). Vascular/Lymphatic: No evidence of abdominal aortic aneurysm. No suspicious abdominopelvic lymphadenopathy. Reproductive: Stable heterogeneous appearance of the retroverted uterus (series 606/ image 89), suggesting uterine fibroids. Bilateral ovaries are within normal limits. Other: No abdominopelvic ascites. Musculoskeletal: Mild to moderate superior endplate compression fracture deformity at L2. Mild degenerative changes of the lower lumbar spine. IMPRESSION: Status post left lower lobectomy. Stable postradiation changes in the left paramediastinal region. Small left pleural effusion. Prior patchy left lower lobe opacity has resolved, with a residual 5 mm left lower lobe nodule, attention on follow-up suggested. Stable focal radiation changes in the right upper lobe. No findings suspicious for metastatic disease. Status post right thyroidectomy. Additional stable ancillary findings as above. Electronically Signed   By: Julian Hy M.D.   On: 12/16/2015 12:11   ASSESSMENT AND PLAN: this is a very pleasant 70 years old [place] American female with metastatic non-small cell lung cancer adenocarcinoma, with positive EGFR mutation but also resistant mutation T790M. She has been on treatment with oral Tarceva for more than 6 years.  The patient has EGFR resistant mutation T790M and was treated for the last 8 months with  [id number] on a clinical trial at [facility] but this was discontinued secondary to inability to follow-up with the protocol requirement. She had no evidence for disease progression on that treatment. The patient is currently on Tagrisso 80 mg by mouth daily status post 12 months and tolerating it fairly well with no significant adverse effects.  The recent CT scan of the neck, chest, abdomen and pelvis showed no evidence for disease progression. I discussed the scan results with the patient today. I recommended for her to continue her treatment with Tagrisso with the same dose. I will see her back for follow-up visit in 2 months for reevaluation after repeating CBC and comprehensive metabolic panel. For the urinary tract infection, the patient will continue her treatment with Bactrim DS as prescribed by Dr. Gaynelle Arabian. She was advised to call immediately if she has any concerning symptoms in the interval. The patient voices understanding of current disease status and  treatment options and is in agreement with the current care plan.  All questions were answered. The patient knows to call the clinic with any problems, questions or concerns. We can certainly see the patient much sooner if necessary.  Disclaimer: This note was dictated with voice recognition software. Similar sounding words can inadvertently be transcribed and may not be corrected upon review.

## 2016-01-04 DIAGNOSIS — Z0000 Encounter for general adult medical examination without abnormal findings: Secondary | ICD-10-CM | POA: Diagnosis not present

## 2016-01-04 DIAGNOSIS — R350 Frequency of micturition: Secondary | ICD-10-CM | POA: Diagnosis not present

## 2016-01-04 DIAGNOSIS — R351 Nocturia: Secondary | ICD-10-CM | POA: Diagnosis not present

## 2016-01-16 IMAGING — CT CT CHEST W/ CM
3 of 9 series · 15 of 46 positions shown, 17 images · IV contrast (OMNIPAQUE)
Comparison: 10/19/2013.

CLINICAL DATA: Metastatic non-small-cell lung cancer with brain
metastasis. Stage IIB adenocarcinoma initially diagnosed in 6776.
Status post left lower lobectomy. Right-sided thyroidectomy in 0994.

EXAM:
CT CHEST, ABDOMEN, AND PELVIS WITH CONTRAST
TECHNIQUE: Multidetector CT imaging of the chest, abdomen and pelvis was
performed following the standard protocol during bolus
administration of intravenous contrast.
CONTRAST:  125mL OMNIPAQUE IOHEXOL 300 MG/ML  SOLN

[Series 2: cap st · axial · 0.89mm/px · z∈[-759,-259]mm · 7 of 134 slices shown, 9 images]
[im 17/134  soft-tissue]
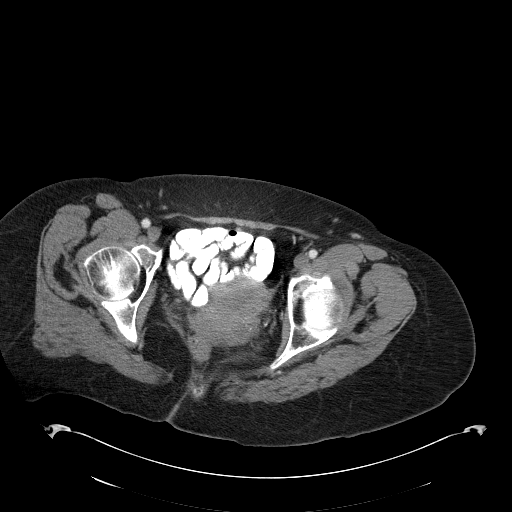
[im 17/134  lung]
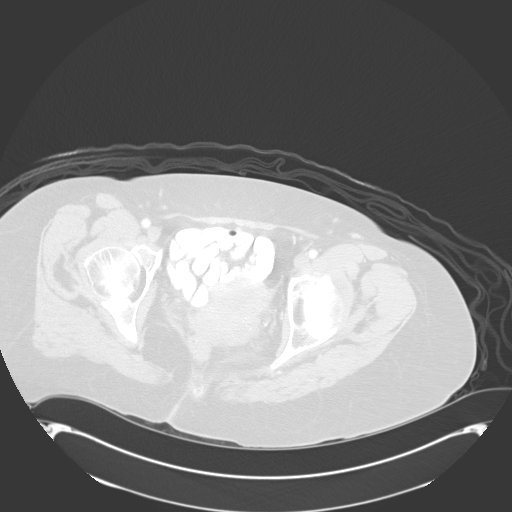
[im 34/134  lung]
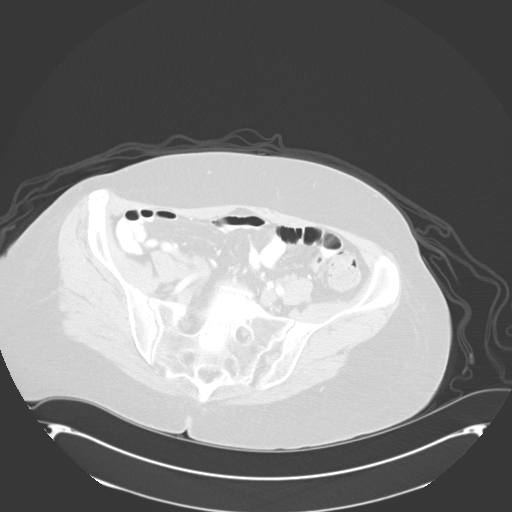
[im 50/134  lung]
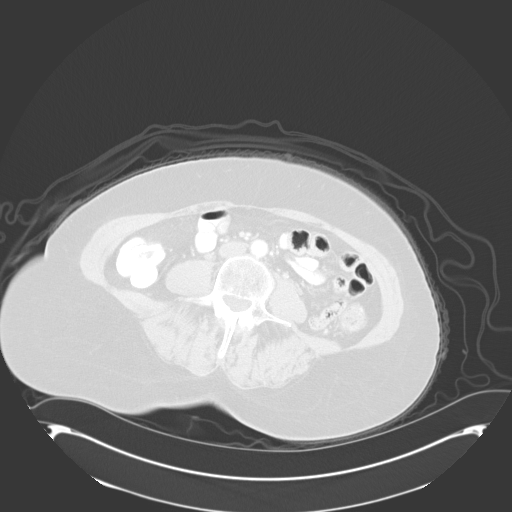
[im 67/134  lung]
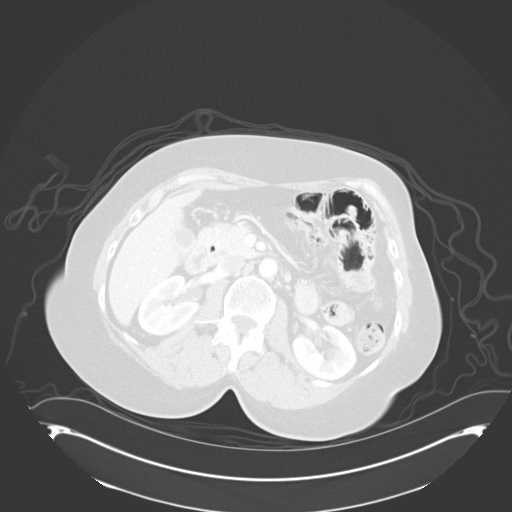
[im 84/134  soft-tissue]
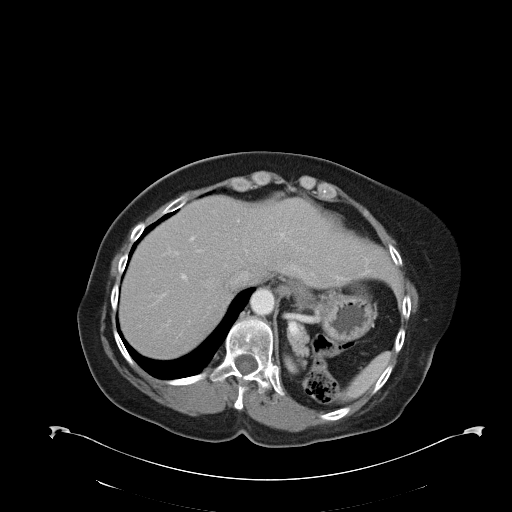
[im 84/134  lung]
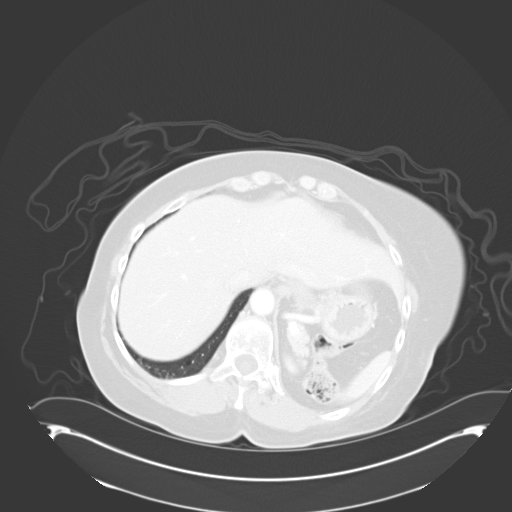
[im 100/134  lung]
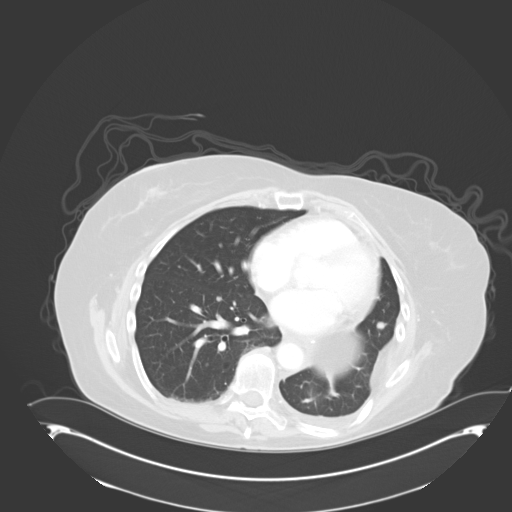
[im 117/134  lung]
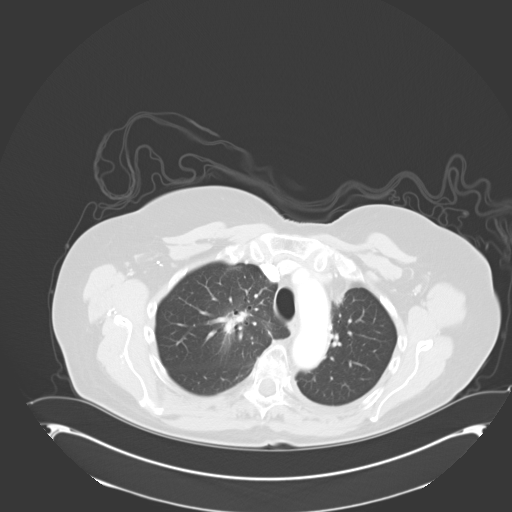

[Series 5: neck st · axial · 0.42mm/px · z∈[-231,-111]mm · 5 of 106 slices shown]
[im 16/106  lung]
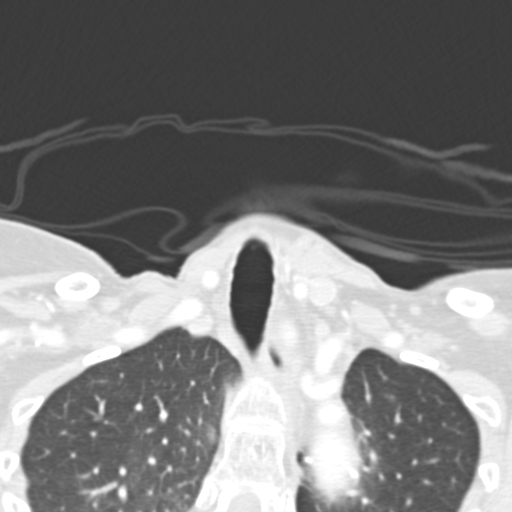
[im 31/106  lung]
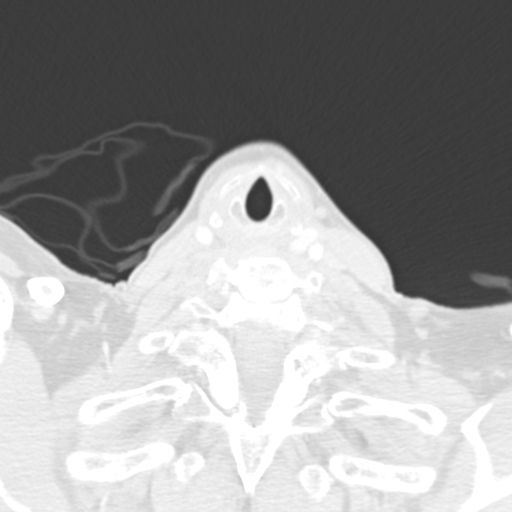
[im 46/106  lung]
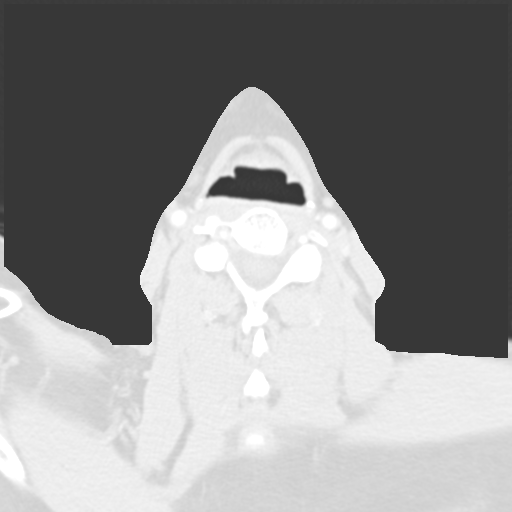
[im 61/106  lung]
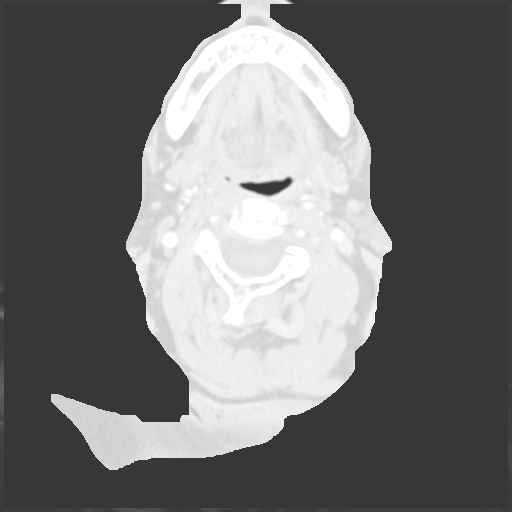
[im 76/106  lung]
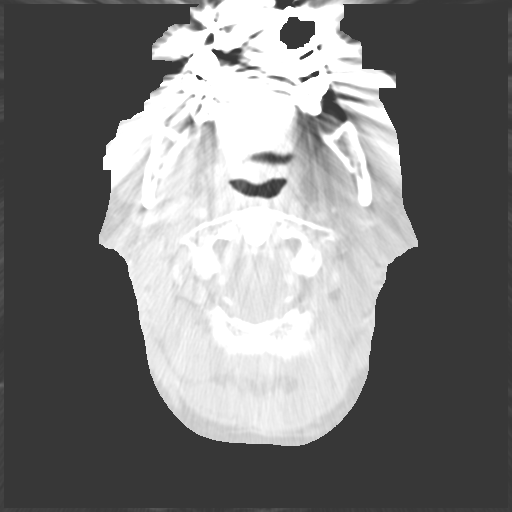

[Series 7: lung windows · axial · 0.89mm/px · z∈[-424,-259]mm · 3 of 67 slices shown]
[im 17/67  lung]
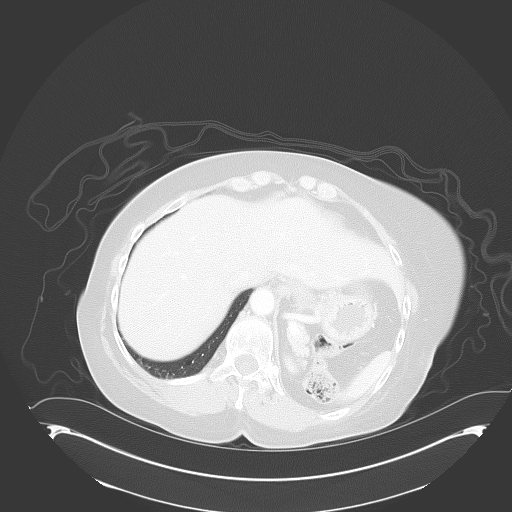
[im 34/67  lung]
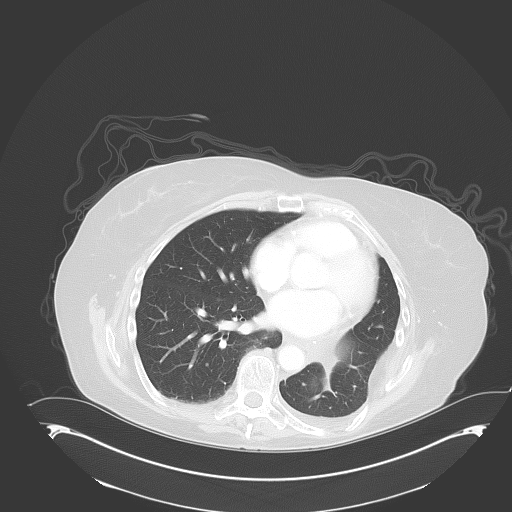
[im 50/67  lung]
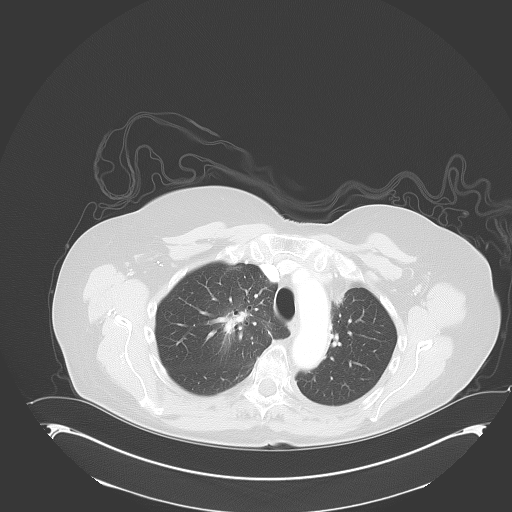

[15 of 46 positions shown; findings below may reference images not displayed]

FINDINGS: CT CHEST FINDINGS

Lungs/Pleura: Surgical changes of left lower lobectomy. Airspace and
ground-glass opacities surrounds the previously described 7 mm right
upper lobe lung nodule. No well-defined residual nodule identified.

3 mm more lateral right upper lobe lung nodule on image 19 which is
unchanged.

2 mm right lower lobe lung nodule which is similar on image 34.
Similar 3 mm right lower lobe lung nodule on image 27 and subpleural
right lower lobe lung nodule also on image 27.

4 mm left apical lung nodule on image 12 is not significantly
changed. More lateral 3 mm left upper lobe nodule on image 16 is
similar.

A 4 mm right upper lobe nodule on image 21 is not significantly
changed.

Left lower lobe 7 mm nodule on image 35, unchanged.

More central subpleural left lower lobe nodule measures 9 mm on
image 32 and is similar (when remeasured).

Small left-sided pleural effusion with mild loculation medially.
This is unchanged.

Heart/Mediastinum: Right-sided thyroidectomy. 8 mm left subpectoral
node on image 17 which is enlarged from 6 mm on the prior.

7 mm left axillary node is new since the prior but not pathologic by
size criteria.

Tortuous descending thoracic aorta. Normal heart size without
pericardial effusion. No central pulmonary embolism, on this
non-dedicated study. No mediastinal or hilar adenopathy. Fluid level
in the thoracic esophagus on image 25.

CT ABDOMEN AND PELVIS FINDINGS

Abdomen/Pelvis: Similar too small to characterize lateral segment
left liver lobe lesion. Normal spleen, stomach, pancreas,
gallbladder, biliary tract, right adrenal gland. Mild left adrenal
thickening is chronic. At least partially duplicated left renal
collecting system. Normal right kidney.

No retroperitoneal or retrocrural adenopathy. Normal colon,
appendix, and terminal ileum. Normal small bowel without abdominal
ascites. No pelvic adenopathy. Normal urinary bladder, without
adnexal mass or significant free pelvic fluid in. Uterine fundal
soft tissue fullness, suggesting an underlying 2.7 cm fibroid.

Moderate left hemidiaphragm elevation.

Bones/Musculoskeletal: Postsurgical left-sided rib defects.
IMPRESSION: CT CHEST IMPRESSION

1. New airspace and ground-glass opacity at the site of previously
described right upper lobe pulmonary nodule. This is likely related
to interval radiation therapy. If no such history, superimposed
infection would be possible.
2. Other  bilateral pulmonary nodules which are similar.
3. Enlargement of left subpectoral and axillary lymph nodes.
Suspicious for nodal metastasis. These could be re-evaluated at
followup or further characterized with PET.
4. Similar small left pleural effusion with minimal loculation
medially.
5. Esophageal air fluid level suggests dysmotility or
gastroesophageal reflux.

CT ABDOMEN AND PELVIS IMPRESSION

1. No acute process or evidence of metastatic disease in the abdomen
or pelvis.
2. Probable uterine fibroid.

## 2016-01-23 ENCOUNTER — Other Ambulatory Visit: Payer: Self-pay | Admitting: Medical Oncology

## 2016-01-23 ENCOUNTER — Telehealth: Payer: Self-pay | Admitting: *Deleted

## 2016-01-23 ENCOUNTER — Other Ambulatory Visit: Payer: Self-pay | Admitting: Internal Medicine

## 2016-01-23 DIAGNOSIS — C3411 Malignant neoplasm of upper lobe, right bronchus or lung: Secondary | ICD-10-CM

## 2016-01-23 MED ORDER — OSIMERTINIB MESYLATE 80 MG PO TABS: 80.0000 mg | ORAL_TABLET | Freq: Every day | ORAL | Status: DC

## 2016-01-23 MED FILL — *TAGRISSO 80MG TABLET: 80 | 30 days supply | Qty: 30 | Fill #0

## 2016-01-23 NOTE — Telephone Encounter (Signed)
"  I need a refill on the Tagrisso Medication through [facility].  I called them but want to make sure you all know.  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

## 2016-02-07 DIAGNOSIS — R3121 Asymptomatic microscopic hematuria: Secondary | ICD-10-CM | POA: Diagnosis not present

## 2016-02-07 DIAGNOSIS — R350 Frequency of micturition: Secondary | ICD-10-CM | POA: Diagnosis not present

## 2016-02-07 DIAGNOSIS — N3020 Other chronic cystitis without hematuria: Secondary | ICD-10-CM | POA: Diagnosis not present

## 2016-03-06 ENCOUNTER — Other Ambulatory Visit (HOSPITAL_BASED_OUTPATIENT_CLINIC_OR_DEPARTMENT_OTHER): Payer: Medicare Other

## 2016-03-06 ENCOUNTER — Ambulatory Visit (HOSPITAL_BASED_OUTPATIENT_CLINIC_OR_DEPARTMENT_OTHER): Payer: Medicare Other | Admitting: Internal Medicine

## 2016-03-06 ENCOUNTER — Telehealth: Payer: Self-pay | Admitting: Internal Medicine

## 2016-03-06 ENCOUNTER — Encounter: Payer: Self-pay | Admitting: Internal Medicine

## 2016-03-06 VITALS — BP 140/79 | HR 80 | Temp 98.3°F | Resp 18 | Ht 67.0 in | Wt 180.6 lb

## 2016-03-06 DIAGNOSIS — Z5111 Encounter for antineoplastic chemotherapy: Secondary | ICD-10-CM

## 2016-03-06 DIAGNOSIS — C7931 Secondary malignant neoplasm of brain: Secondary | ICD-10-CM

## 2016-03-06 DIAGNOSIS — C3411 Malignant neoplasm of upper lobe, right bronchus or lung: Secondary | ICD-10-CM | POA: Diagnosis not present

## 2016-03-06 DIAGNOSIS — N390 Urinary tract infection, site not specified: Secondary | ICD-10-CM

## 2016-03-06 LAB — CBC WITH DIFFERENTIAL/PLATELET
BASO%: 0.3 % (ref 0.0–2.0)
Basophils Absolute: 0 10*3/uL (ref 0.0–0.1)
EOS%: 2.3 % (ref 0.0–7.0)
Eosinophils Absolute: 0.1 10*3/uL (ref 0.0–0.5)
HCT: 38 % (ref 34.8–46.6)
HGB: 12.4 g/dL (ref 11.6–15.9)
LYMPH%: 19.6 % (ref 14.0–49.7)
MCH: 29.7 pg (ref 25.1–34.0)
MCHC: 32.6 g/dL (ref 31.5–36.0)
MCV: 91.1 fL (ref 79.5–101.0)
MONO#: 0.3 10*3/uL (ref 0.1–0.9)
MONO%: 9.5 % (ref 0.0–14.0)
NEUT#: 2.4 10*3/uL (ref 1.5–6.5)
NEUT%: 68.3 % (ref 38.4–76.8)
Platelets: 157 10*3/uL (ref 145–400)
RBC: 4.17 10*6/uL (ref 3.70–5.45)
RDW: 13.9 % (ref 11.2–14.5)
WBC: 3.5 10*3/uL — ABNORMAL LOW (ref 3.9–10.3)
lymph#: 0.7 10*3/uL — ABNORMAL LOW (ref 0.9–3.3)

## 2016-03-06 LAB — COMPREHENSIVE METABOLIC PANEL
ALT: 10 U/L (ref 0–55)
AST: 12 U/L (ref 5–34)
Albumin: 3.9 g/dL (ref 3.5–5.0)
Alkaline Phosphatase: 62 U/L (ref 40–150)
Anion Gap: 8 mEq/L (ref 3–11)
BUN: 12.2 mg/dL (ref 7.0–26.0)
CO2: 26 mEq/L (ref 22–29)
Calcium: 9.4 mg/dL (ref 8.4–10.4)
Chloride: 106 mEq/L (ref 98–109)
Creatinine: 0.9 mg/dL (ref 0.6–1.1)
EGFR: 77 mL/min/{1.73_m2} — ABNORMAL LOW (ref >90)
Glucose: 82 mg/dl (ref 70–140)
Potassium: 3.8 mEq/L (ref 3.5–5.1)
Sodium: 140 mEq/L (ref 136–145)
Total Bilirubin: 0.57 mg/dL (ref 0.20–1.20)
Total Protein: 6.7 g/dL (ref 6.4–8.3)

## 2016-03-06 MED FILL — *TAGRISSO 80MG TABLET: 80 | 30 days supply | Qty: 30 | Fill #1

## 2016-03-06 NOTE — Progress Notes (Signed)
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  BIOMARKERS TESTING ([facility]): Positive for: EGFR [id number] del, T790M, CTNNB1 D32G, SMAD4 T120f*10 Negative for: RET, ALK, BRAF, KRAS, ERBB2 and MET  PRIOR THERAPY:  1. Status post left lower lobectomy under the care of Dr BArlyce Dicein October 2002. 2. Status post course of concurrent chemoradiation with weekly carboplatin and paclitaxel under the care of Dr SBenay Spiceand Dr WElba Barmanin early 2003. 3. Status post [facility] 4. Status post left occipital craniotomy for tumor resection on September 21, 2005, under the care of Dr CChristella Noa 5. Status post whole brain irradiation under the care of Dr WElba Barman completed November 07, 2005. 6. Status post gamma knife [facility]. Status post right thyroidectomy with radical neck dissection under the care of Dr. RConstance Holsteron 10/18/2011. 8. Status post excisional biopsy of right cervical lymph node that was consistent with metastatic adenocarcinoma. 9. Tarceva 150 mg by mouth daily with therapy beginning 06/03/2007, discontinued in July 2015 secondary to disease progression. Status post 79 cycles. 10. palliative radiotherapy to the enlarging right upper lobe lung nodule under the care of Dr. MValere Dross [facility]. [id number] 300 mg by mouth daily for 21 days discontinued secondary to study deviation and [facility]. 12. Open reduction and internal  fixation of trimalleolar ankle fracture dislocation with fixation of the fibula as well as medial          malleolus.   CURRENT THERAPY:  1) Tagrisso 80 mg by mouth daily, started 11/25/2014, status post 14 months of treatment.  INTERVAL HISTORY: Valerie LOOKINGBILL70y.o. female returns to the clinic today for follow up visit. The patient is feeling fine today with no specific complaints. She is currently on treatment with Tagrisso 80 mg by mouth daily for the last 14 months and she is tolerating her treatment fairly well with no significant adverse effects. She denied having any significant skin rash or diarrhea. She denied having any significant chest pain, shortness of breath, cough or hemoptysis. She denied having any significant nausea or vomiting. She had repeat CBC and comprehensive metabolic panel and she is here today for evaluation and discussion of her lab results.  MEDICAL HISTORY: Past Medical History  Diagnosis Date  . Heart murmur   . Shortness of breath     hx lung ca   . Arthritis   . Anal fistula   . H/O osteoporosis   . H/O vitamin D deficiency   . History of measles, mumps, or rubella   . H/O varicella   . Hypertension   . Yeast infection   . Abnormal Pap smear 2006  . Atrophic vaginitis 2008  . Dyspareunia 2008  . Dementia 2009  . Osteoporosis 2010  . Status post chemotherapy 2003    CARBOPLATIN/PACLITAXEL /STATUS POST CLINICAL TRAIL OF CELEBREX AND IRESSA AT [facility] FOR 1 YEAR  . Status post radiation therapy 2003    LEFT LUNG  . Status  post radiation therapy 11/07/2005    WHOLE BRAIN: DR Larkin [facility]  . Status post radiation therapy 06/02/2008    GAMMA KNIFE OF RESECTED CAVITAY  . On antineoplastic chemotherapy     TARCEVA  . Nodule of right lung CT- 06/03/12    RIGHT UPPER LOBE  . Thyroid adenoma     ?  [name] Anxiety   . Cataract   . Nodule of right lung 06/03/12    Upper Lobe  . History of radiation therapy 07/28/13- 08/10/13    right lung  metastasis 5000 cGy 10 sessions  . Ankle fracture   . Lung cancer ([place]) dx'd 2002  . Metastasis to brain [facility]) dx'd 2008  . 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 Thyroid cancer ([place]) 10/18/11 bx    adenoid nodules   . Lung metastasis ([place])     PET scan 05/05/13, RUL lung nodule  . Lung cancer ([place])   . Lung cancer ([place])   . Primary cancer of right upper lobe of lung ([place]) 04/22/2009    Qualifier: Diagnosis of  By: Nils Pyle CMA (AAMA), Mearl Latin      ALLERGIES:  has No Known Allergies.  MEDICATIONS:  Current Outpatient Prescriptions  Medication Sig Dispense Refill  . alendronate (FOSAMAX) 70 MG tablet Take 1 tablet (70 mg total) by mouth every 7 (seven) days. Saturdays; Take with a full glass of water on an empty stomach. 4 tablet 12  . Ascorbic Acid (VITAMIN C) 100 MG tablet Take 100 mg by mouth daily.    . cephALEXin (KEFLEX) 500 MG capsule     . cholecalciferol (VITAMIN D) 1000 UNITS tablet Take 1,000 Units by mouth at bedtime.    . donepezil (ARICEPT) 10 MG tablet Take 10 mg by mouth daily.  0  . loperamide (IMODIUM) 2 MG capsule Take 2 mg by mouth as needed for diarrhea or loose stools.     . Multiple Vitamin (MULTI-VITAMINS) TABS Take 1 tablet by mouth daily.     . naproxen sodium (ANAPROX) 220 MG tablet Take 220 mg by mouth 2 (two) times daily as needed (pain). Reported on 11/22/2015    . Osimertinib Mesylate (TAGRISSO PO)   2  . osimertinib mesylate (TAGRISSO) 80 MG tablet Take 1 tablet (80 mg total) by mouth daily. 30 tablet 2  . sulfamethoxazole-trimethoprim (BACTRIM DS,SEPTRA DS) 800-160 MG tablet     . trimethoprim (TRIMPEX) 100 MG tablet     . XARELTO 20 MG TABS tablet      No current facility-administered medications for this visit.    SURGICAL HISTORY:  Past Surgical History  Procedure Laterality Date  . Left occipital craniotomy  09/21/2005    tumor  . Left lower lobectomy  05/2001  . Radical neck dissection  10/18/2011    Procedure: RADICAL NECK DISSECTION;   Surgeon: Izora Gala, MD;  Location: [place];  Service: ENT;  Laterality: Right;  RIGHT MODIFIED NECK DISSECTION /POSSIBLE RIGHT THYROIDECTOM  . Thyroidectomy  10/18/2011    Procedure: THYROIDECTOMY WITH RADICAL NECK DISSECTION;  Surgeon: Izora Gala, MD;  Location: [place];  Service: ENT;  Laterality: Right;  . Lung lobectomy    . Neck surgery    . Brain surgery    . Orif ankle fracture Left 10/02/2014    Procedure: OPEN REDUCTION INTERNAL FIXATION (ORIF) ANKLE FRACTURE;  Surgeon: Alta Corning, MD;  Location: [facility];  Service: Orthopedics;  Laterality: Left;    REVIEW OF SYSTEMS:  A comprehensive review of  systems was negative except for: Constitutional: positive for fatigue   PHYSICAL EXAMINATION: General appearance: alert, cooperative, fatigued and no distress Head: Normocephalic, without obvious abnormality, atraumatic Neck: no adenopathy Lymph nodes: Cervical, supraclavicular, and axillary nodes normal. Resp: clear to auscultation bilaterally Back: symmetric, no curvature. ROM normal. No CVA tenderness. Cardio: regular rate and rhythm, S1, S2 normal, no murmur, click, rub or gallop GI: soft, non-tender; bowel sounds normal; no masses,  no organomegaly Extremities: extremities normal, atraumatic, no cyanosis or edema Neurologic: Alert and oriented X 3, normal strength and tone. Normal symmetric reflexes. Normal coordination and gait  ECOG PERFORMANCE STATUS: 1 - Symptomatic but completely ambulatory  Blood pressure 140/79, pulse 80, temperature 98.3 F (36.8 C), temperature source Oral, resp. rate 18, height _0  (1.702 m), weight 180 lb 9.6 oz (81.92 kg), SpO2 99 %.  LABORATORY DATA: Lab Results  Component Value Date   WBC 3.5* 03/06/2016   HGB 12.4 03/06/2016   HCT 38.0 03/06/2016   MCV 91.1 03/06/2016   PLT 157 03/06/2016      Chemistry      Component Value Date/Time   NA 140 03/06/2016 0941   NA 137 08/29/2015 0505   NA 138 02/28/2012 0939   K 3.8 03/06/2016 0941   K  3.6 08/29/2015 0505   K 4.6 02/28/2012 0939   CL 102 08/29/2015 0505   CL 106 12/09/2012 0806   CL 99 02/28/2012 0939   CO2 26 03/06/2016 0941   CO2 28 08/29/2015 0505   CO2 28 02/28/2012 0939   BUN 12.2 03/06/2016 0941   BUN 9 08/29/2015 0505   BUN 12 02/28/2012 0939   CREATININE 0.9 03/06/2016 0941   CREATININE 0.49 08/29/2015 0505   CREATININE 1.0 02/28/2012 0939      Component Value Date/Time   CALCIUM 9.4 03/06/2016 0941   CALCIUM 8.8* 08/29/2015 0505   CALCIUM 9.1 02/28/2012 0939   ALKPHOS 62 03/06/2016 0941   ALKPHOS 50 08/27/2015 0553   ALKPHOS 80 02/28/2012 0939   AST 12 03/06/2016 0941   AST 23 08/27/2015 0553   AST 20 02/28/2012 0939   ALT 10 03/06/2016 0941   ALT 21 08/27/2015 0553   ALT 21 02/28/2012 0939   BILITOT 0.57 03/06/2016 0941   BILITOT 1.1 08/27/2015 0553   BILITOT 1.30 02/28/2012 0939       RADIOGRAPHIC STUDIES: No results found. ASSESSMENT AND PLAN: this is a very pleasant 70 years old African American female with metastatic non-small cell lung cancer adenocarcinoma, with positive EGFR mutation but also resistant mutation T790M. She has been on treatment with oral Tarceva for more than 6 years.  The patient has EGFR resistant mutation T790M and was treated for the last 8 months with [id number] on a clinical trial at [facility] but this was discontinued secondary to inability to follow-up with the protocol requirement. She had no evidence for disease progression on that treatment. The patient is currently on Tagrisso 80 mg by mouth daily status post 14 months and tolerating it fairly well with no significant adverse effects. She is doing fine today with no specific complaints. I recommended for her to continue her treatment with Tagrisso with the same dose. I will see her back for follow-up visit in one month for reevaluation with repeat blood work as well as CT scan of the neck, chest, abdomen and pelvis for restaging of her disease.  She was  advised to call immediately if she has any concerning symptoms in the  interval. The patient voices understanding of current disease status and treatment options and is in agreement with the current care plan.  All questions were answered. The patient knows to call the clinic with any problems, questions or concerns. We can certainly see the patient much sooner if necessary.  Disclaimer: This note was dictated with voice recognition software. Similar sounding words can inadvertently be transcribed and may not be corrected upon review.

## 2016-03-06 NOTE — Telephone Encounter (Signed)
Gave patient avs report and appointments for October. Radiology scheduling will call patient re ct - patient aware.

## 2016-03-06 NOTE — Telephone Encounter (Signed)
Correction - appointment information given for August

## 2016-03-22 ENCOUNTER — Telehealth: Payer: Self-pay | Admitting: Internal Medicine

## 2016-03-23 NOTE — Telephone Encounter (Signed)
Error

## 2016-03-26 ENCOUNTER — Telehealth: Payer: Self-pay | Admitting: *Deleted

## 2016-03-26 ENCOUNTER — Telehealth: Payer: Self-pay | Admitting: Internal Medicine

## 2016-03-26 NOTE — Telephone Encounter (Signed)
FYI Patient called expressing "not happy with scheduling for scans.  Has been with [facility] for [facility] years and recalls receiving all scheduled information for lab, MD, scans, treatments before leaving the center.  I love Dr. Julien Nordmann is why I stay."  Now, she has to call different places.  Listened to her verbalize explaining central scheduling began in 2007 and [facility] schedulers have reorganized our mode of operation.  Apologized for any inconviences but assured her we're working hard to ensure appointments ar scheduled correctly.

## 2016-03-26 NOTE — Telephone Encounter (Signed)
per email from [facility] to move labs to 8/10'@11'$ :30

## 2016-03-29 ENCOUNTER — Other Ambulatory Visit (HOSPITAL_BASED_OUTPATIENT_CLINIC_OR_DEPARTMENT_OTHER): Payer: Medicare Other

## 2016-03-29 ENCOUNTER — Encounter (HOSPITAL_COMMUNITY): Payer: Self-pay

## 2016-03-29 ENCOUNTER — Ambulatory Visit (HOSPITAL_COMMUNITY)
Admission: RE | Admit: 2016-03-29 | Discharge: 2016-03-29 | Disposition: A | Discharge status: Home / Self Care | Payer: Medicare Other | Source: Ambulatory Visit | Attending: Internal Medicine | Admitting: Internal Medicine

## 2016-03-29 DIAGNOSIS — Z9089 Acquired absence of other organs: Secondary | ICD-10-CM | POA: Insufficient documentation

## 2016-03-29 DIAGNOSIS — Z5111 Encounter for antineoplastic chemotherapy: Secondary | ICD-10-CM

## 2016-03-29 DIAGNOSIS — Z85850 Personal history of malignant neoplasm of thyroid: Secondary | ICD-10-CM | POA: Diagnosis not present

## 2016-03-29 DIAGNOSIS — C3411 Malignant neoplasm of upper lobe, right bronchus or lung: Secondary | ICD-10-CM | POA: Insufficient documentation

## 2016-03-29 DIAGNOSIS — C3490 Malignant neoplasm of unspecified part of unspecified bronchus or lung: Secondary | ICD-10-CM | POA: Diagnosis not present

## 2016-03-29 LAB — CBC WITH DIFFERENTIAL/PLATELET
BASO%: 0.4 % (ref 0.0–2.0)
Basophils Absolute: 0 10*3/uL (ref 0.0–0.1)
EOS%: 1.1 % (ref 0.0–7.0)
Eosinophils Absolute: 0.1 10*3/uL (ref 0.0–0.5)
HCT: 41 % (ref 34.8–46.6)
HGB: 13 g/dL (ref 11.6–15.9)
LYMPH%: 11.5 % — ABNORMAL LOW (ref 14.0–49.7)
MCH: 29.2 pg (ref 25.1–34.0)
MCHC: 31.8 g/dL (ref 31.5–36.0)
MCV: 91.9 fL (ref 79.5–101.0)
MONO#: 0.4 10*3/uL (ref 0.1–0.9)
MONO%: 7.3 % (ref 0.0–14.0)
NEUT#: 4 10*3/uL (ref 1.5–6.5)
NEUT%: 79.7 % — ABNORMAL HIGH (ref 38.4–76.8)
Platelets: 187 10*3/uL (ref 145–400)
RBC: 4.46 10*6/uL (ref 3.70–5.45)
RDW: 14.5 % (ref 11.2–14.5)
WBC: 5 10*3/uL (ref 3.9–10.3)
lymph#: 0.6 10*3/uL — ABNORMAL LOW (ref 0.9–3.3)

## 2016-03-29 LAB — COMPREHENSIVE METABOLIC PANEL
ALT: 12 U/L (ref 0–55)
AST: 13 U/L (ref 5–34)
Albumin: 4.1 g/dL (ref 3.5–5.0)
Alkaline Phosphatase: 55 U/L (ref 40–150)
Anion Gap: 8 mEq/L (ref 3–11)
BUN: 19.1 mg/dL (ref 7.0–26.0)
CO2: 25 mEq/L (ref 22–29)
Calcium: 9.8 mg/dL (ref 8.4–10.4)
Chloride: 108 mEq/L (ref 98–109)
Creatinine: 1 mg/dL (ref 0.6–1.1)
EGFR: 64 mL/min/{1.73_m2} — ABNORMAL LOW (ref >90)
Glucose: 95 mg/dl (ref 70–140)
Potassium: 4.2 mEq/L (ref 3.5–5.1)
Sodium: 141 mEq/L (ref 136–145)
Total Bilirubin: 0.8 mg/dL (ref 0.20–1.20)
Total Protein: 7 g/dL (ref 6.4–8.3)

## 2016-03-29 MED ORDER — DIATRIZOATE MEGLUMINE & SODIUM 66-10 % PO SOLN
30.0000 mL | Freq: Once | ORAL | Status: AC
  Administered 2016-03-29: 30 mL via ORAL

## 2016-03-29 MED ORDER — IOPAMIDOL (ISOVUE-300) INJECTION 61%
100.0000 mL | Freq: Once | INTRAVENOUS | Status: AC | PRN
  Administered 2016-03-29: 100 mL via INTRAVENOUS

## 2016-03-30 ENCOUNTER — Other Ambulatory Visit: Payer: Self-pay

## 2016-04-02 ENCOUNTER — Telehealth: Payer: Self-pay | Admitting: Internal Medicine

## 2016-04-02 ENCOUNTER — Encounter: Payer: Self-pay | Admitting: Internal Medicine

## 2016-04-02 ENCOUNTER — Ambulatory Visit (HOSPITAL_BASED_OUTPATIENT_CLINIC_OR_DEPARTMENT_OTHER): Payer: Medicare Other | Admitting: Internal Medicine

## 2016-04-02 VITALS — BP 142/66 | HR 90 | Temp 97.8°F | Resp 18 | Ht 67.0 in | Wt 180.1 lb

## 2016-04-02 DIAGNOSIS — C719 Malignant neoplasm of brain, unspecified: Secondary | ICD-10-CM

## 2016-04-02 DIAGNOSIS — C3411 Malignant neoplasm of upper lobe, right bronchus or lung: Secondary | ICD-10-CM | POA: Diagnosis not present

## 2016-04-02 DIAGNOSIS — C7931 Secondary malignant neoplasm of brain: Secondary | ICD-10-CM

## 2016-04-02 DIAGNOSIS — Z5111 Encounter for antineoplastic chemotherapy: Secondary | ICD-10-CM

## 2016-04-02 NOTE — Telephone Encounter (Signed)
GAVE PATIENT AVS REPORT AND APPOINTMENTS FOR OCTOBER.

## 2016-04-02 NOTE — Progress Notes (Signed)
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  BIOMARKERS TESTING ([facility]): Positive for: EGFR [id number] del, T790M, CTNNB1 D32G, SMAD4 T174f*10 Negative for: RET, ALK, BRAF, KRAS, ERBB2 and MET  PRIOR THERAPY:  1. Status post left lower lobectomy under the care of Dr BArlyce Dicein October 2002. 2. Status post course of concurrent chemoradiation with weekly carboplatin and paclitaxel under the care of Dr SBenay Spiceand Dr WElba Barmanin early 2003. 3. Status post [facility] 4. Status post left occipital craniotomy for tumor resection on September 21, 2005, under the care of Dr CChristella Noa 5. Status post whole brain irradiation under the care of Dr WElba Barman completed November 07, 2005. 6. Status post gamma knife [facility]. Status post right thyroidectomy with radical neck dissection under the care of Dr. RConstance Holsteron 10/18/2011. 8. Status post excisional biopsy of right cervical lymph node that was consistent with metastatic adenocarcinoma. 9. Tarceva 150 mg by mouth daily with therapy beginning 06/03/2007, discontinued in July 2015 secondary to disease progression. Status post 79 cycles. 10. palliative radiotherapy to the enlarging right upper lobe lung nodule under the care of Dr. MValere Dross [facility]. [id number] 300 mg by mouth daily for 21 days discontinued secondary to study deviation and [facility]. 12. Open reduction and internal  fixation of trimalleolar ankle fracture dislocation with fixation of the fibula as well as medial          malleolus.   CURRENT THERAPY:  1) Tagrisso 80 mg by mouth daily, started 11/25/2014, status post 15 months of treatment.  INTERVAL HISTORY: Valerie SEABERG70y.o. female returns to the clinic today for follow up visit. The patient is feeling fine today with no specific complaints. She is currently on treatment with Tagrisso 80 mg by mouth daily status post 15 months and she is tolerating her treatment fairly well with no significant adverse effects. She denied having any significant skin rash or diarrhea. She denied having any significant chest pain, shortness of breath, cough or hemoptysis. She denied having any significant nausea, vomiting, abdominal pain, diarrhea or constipation. She had repeat CBC and comprehensive metabolic panel as well as CT scan of the neck, chest, abdomen and pelvis performed recently and she is here today for evaluation and discussion of her lab and his scan results.  MEDICAL HISTORY: Past Medical History:  Diagnosis Date  . Abnormal Pap smear 2006  . Anal fistula   . Ankle fracture   . Anxiety   . Arthritis   . Atrophic vaginitis 2008  . Cataract   . Dementia 2009  . Dyspareunia 2008  . H/O osteoporosis   . H/O varicella   . H/O vitamin D deficiency   . Heart murmur   . History of measles, mumps, or rubella   . History of radiation therapy 07/28/13- 08/10/13   right lung metastasis 5000 cGy 10 sessions  . Hypertension   . [place]  . Lung  cancer ([place])   . Lung cancer ([place])   . Lung metastasis ([place])    PET scan 05/05/13, RUL lung nodule  . Metastasis to brain [facility]) dx'd 2008  . 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 Nodule of right lung CT- 06/03/12   RIGHT UPPER LOBE  . Nodule of right lung 06/03/12   Upper Lobe  . On antineoplastic chemotherapy    TARCEVA  . Osteoporosis 2010  . Primary cancer of right  upper lobe of lung ([place]) 04/22/2009   Qualifier: Diagnosis of  By: Nils Pyle CMA ([place]), Mearl Latin    . Shortness of breath    hx lung ca   . Status post chemotherapy 2003   CARBOPLATIN/PACLITAXEL /STATUS POST CLINICAL TRAIL OF CELEBREX AND IRESSA AT [facility] FOR 1 YEAR  . Status post radiation therapy 2003   LEFT LUNG  . Status post radiation therapy 11/07/2005   WHOLE BRAIN: DR Larkin In[facility]  . Status post radiation therapy 06/02/2008   GAMMA KNIFE OF RESECTED CAVITAY  . Thyroid adenoma    ?  [name] Thyroid cancer ([place]) 10/18/11 bx   adenoid nodules   . Yeast infection     ALLERGIES:  has No Known Allergies.  MEDICATIONS:  Current Outpatient Prescriptions  Medication Sig Dispense Refill  . alendronate (FOSAMAX) 70 MG tablet Take 1 tablet (70 mg total) by mouth every 7 (seven) days. Saturdays; Take with a full glass of water on an empty stomach. 4 tablet 12  . Ascorbic Acid (VITAMIN C) 100 MG tablet Take 100 mg by mouth daily.    . cephALEXin (KEFLEX) 500 MG capsule     . cholecalciferol (VITAMIN D) 1000 UNITS tablet Take 1,000 Units by mouth at bedtime.    . donepezil (ARICEPT) 10 MG tablet Take 10 mg by mouth daily.  0  . loperamide (IMODIUM) 2 MG capsule Take 2 mg by mouth as needed for diarrhea or loose stools.     . Multiple Vitamin (MULTI-VITAMINS) TABS Take 1 tablet by mouth daily.     . naproxen sodium (ANAPROX) 220 MG tablet Take 220 mg by mouth 2 (two) times daily as needed (pain). Reported on 11/22/2015    . Osimertinib Mesylate (TAGRISSO PO)   2  . osimertinib mesylate (TAGRISSO) 80 MG tablet Take 1 tablet (80 mg total) by mouth daily. 30 tablet 2  . sulfamethoxazole-trimethoprim (BACTRIM DS,SEPTRA DS) 800-160 MG tablet     . trimethoprim (TRIMPEX) 100 MG tablet     . XARELTO 20 MG TABS tablet      No current facility-administered medications for this visit.     SURGICAL HISTORY:  Past Surgical History:  Procedure Laterality Date  . BRAIN SURGERY    . LEFT LOWER LOBECTOMY  05/2001   . LEFT OCCIPITAL CRANIOTOMY  09/21/2005   tumor  . LUNG LOBECTOMY    . NECK SURGERY    . ORIF ANKLE FRACTURE Left 10/02/2014   Procedure: OPEN REDUCTION INTERNAL FIXATION (ORIF) ANKLE FRACTURE;  Surgeon: Alta Corning, MD;  Location: [facility];  Service: Orthopedics;  Laterality: Left;  . RADICAL NECK DISSECTION  10/18/2011   Procedure: RADICAL NECK DISSECTION;  Surgeon: Izora Gala, MD;  Location: [place];  Service: ENT;  Laterality: Right;  RIGHT MODIFIED NECK DISSECTION /POSSIBLE RIGHT THYROIDECTOM  . THYROIDECTOMY  10/18/2011   Procedure: THYROIDECTOMY WITH RADICAL NECK DISSECTION;  Surgeon: Izora Gala, MD;  Location: [place];  Service: ENT;  Laterality: Right;    REVIEW OF SYSTEMS:  Constitutional: positive for fatigue Eyes: negative Ears, nose, mouth, throat, and face: negative Respiratory: negative Cardiovascular: negative Gastrointestinal: negative Genitourinary:negative Integument/breast: negative Hematologic/lymphatic: negative Musculoskeletal:negative Neurological: negative Behavioral/Psych: negative Endocrine: negative Allergic/Immunologic: negative   PHYSICAL EXAMINATION: General appearance: alert, cooperative, fatigued and no distress Head: Normocephalic, without obvious abnormality, atraumatic Neck: no adenopathy Lymph nodes: Cervical, supraclavicular, and axillary nodes normal. Resp: clear to auscultation bilaterally Back: symmetric, no curvature. ROM normal. No CVA tenderness. Cardio: regular rate and rhythm, S1, S2 normal, no murmur, click, rub or gallop GI: soft, non-tender; bowel sounds normal; no masses,  no organomegaly Extremities: extremities normal, atraumatic, no cyanosis or edema Neurologic: Alert and oriented X 3, normal strength and tone. Normal symmetric reflexes. Normal coordination and gait  ECOG PERFORMANCE STATUS: 1 - Symptomatic but completely ambulatory  Blood pressure (!) 142/66, pulse 90, temperature 97.8 F (36.6 C), temperature source Oral,  resp. rate 18, height _0  (1.702 m), weight 180 lb 1.6 oz (81.7 kg), SpO2 100 %.  LABORATORY DATA: Lab Results  Component Value Date   WBC 5.0 03/29/2016   HGB 13.0 03/29/2016   HCT 41.0 03/29/2016   MCV 91.9 03/29/2016   PLT 187 03/29/2016      Chemistry      Component Value Date/Time   NA 141 03/29/2016 1146   K 4.2 03/29/2016 1146   CL 102 08/29/2015 0505   CL 106 12/09/2012 0806   CO2 25 03/29/2016 1146   BUN 19.1 03/29/2016 1146   CREATININE 1.0 03/29/2016 1146      Component Value Date/Time   CALCIUM 9.8 03/29/2016 1146   ALKPHOS 55 03/29/2016 1146   AST 13 03/29/2016 1146   ALT 12 03/29/2016 1146   BILITOT 0.80 03/29/2016 1146       RADIOGRAPHIC STUDIES: Ct Soft Tissue Neck W Contrast  Result Date: 03/29/2016 CLINICAL DATA:  Metastatic non-small-cell lung cancer diagnosed in 2002 status post radiotherapy. Multiple recurrences. Thyroid cancer February 2013. EXAM: CT NECK WITH CONTRAST TECHNIQUE: Multidetector CT imaging of the neck was performed using the standard protocol following the bolus administration of intravenous contrast. CONTRAST:  161m ISOVUE-300 IOPAMIDOL (ISOVUE-300) INJECTION 61% COMPARISON:  11/26/2015 FINDINGS: Pharynx and larynx: Streak artifact obscures much of the oral cavity. No suspicious asymmetry or enhancement. Salivary glands: Fatty atrophy of the right parotid. Negative for mass or acute inflammation. Thyroid: Right hemithyroidectomy. No suspicious findings in the thyroid bed. 2 left-sided nodules are stable from prior and measure up to 5 mm. Lymph nodes: None enlarged or abnormal density. Vascular: Major cervical vessels are patent. Mild atherosclerotic calcification at the cervical carotid bifurcations. Limited intracranial: Negative Visualized orbits: Negative Mastoids and visualized paranasal sinuses: Clear Skeleton: No acute or aggressive process Upper chest: Reported separately IMPRESSION: Partial thyroidectomy for cancer; no evidence of  recurrence. No evidence of lung cancer metastasis to the neck. Electronically Signed   By: JMonte FantasiaM.D.   On: 03/29/2016 14:56   Ct Chest W Contrast  Result Date: 03/29/2016 CLINICAL DATA:  Metastatic non-small-cell lung cancer diagnosed in 2002 with multiple recurrences. Restaging post radiation therapy. History of thyroid cancer in 2013. EXAM: CT CHEST, ABDOMEN, AND PELVIS WITH CONTRAST TECHNIQUE: Multidetector CT imaging of the chest, abdomen and pelvis was performed following the standard protocol during bolus administration of intravenous contrast. CONTRAST:  1054mISOVUE-300 IOPAMIDOL (ISOVUE-300) INJECTION 61% COMPARISON:  Prior CTs dated 08/24/2015 and 12/16/2015) FINDINGS: CT CHEST FINDINGS The neck CT findings have been dictated separately. Cardiovascular: There are no significant vascular findings. Stable  ductus bump. The heart size is normal. There is no pericardial effusion. Mediastinum/Nodes: There are no enlarged mediastinal, hilar, axillary or internal mammary lymph nodes. There is stable distortion of the left hilum. The right thyroid lobe is surgically absent. The left lobe appears unremarkable. Small hiatal hernia. Lungs/Pleura: Stable volume loss in the left hemithorax with a small amount of loculated pleural fluid posteromedially status post left lower lobectomy. 4 mm left upper lobe nodule on image 43 is unchanged. There are stable radiation changes medially in the left hemithorax. Focal radiation changes are also present in the right suprahilar region, stable. 4 mm right upper lobe nodule on image 44 is unchanged. No new or enlarging nodules are seen. Musculoskeletal/Chest wall: No chest wall mass or suspicious osseous findings. Stable thoracotomy defects on the left and chronic sternal deformity. CT ABDOMEN AND PELVIS FINDINGS Hepatobiliary: Stable tiny cyst in the left hepatic lobe (image 53). No suspicious hepatic findings. The gallbladder is better distended without wall  thickening or stones. There is mild extrahepatic biliary prominence with common bile duct measuring up to 7 mm in diameter. There is an apparent periampullary duodenal diverticulum containing air. Pancreas: No evidence of pancreatic mass, surrounding inflammation or fluid collection. There is mild prominence of the pancreatic duct which measures up to 6 mm in diameter. This appears new. Spleen: Normal in size without focal abnormality. Adrenals/Urinary Tract: Both adrenal glands appear stable without suspicious findings. The kidneys appear normal without evidence of urinary tract calculus, suspicious lesion or hydronephrosis. No bladder abnormalities are seen. Stomach/Bowel: No evidence of bowel wall thickening, distention or surrounding inflammatory change. Vascular/Lymphatic: There are no enlarged abdominal or pelvic lymph nodes. No significant vascular findings are present. Reproductive: Stable lobularity of the uterus, probably secondary to fibroids. There is potentially some fluid within the endometrial canal, although the appearance is unchanged. No evidence of adnexal mass. Other: No ascites or peritoneal nodularity. Musculoskeletal: No acute or significant osseous findings. Stable chronic compression deformities at L4 and L5. IMPRESSION: 1. Stable chest CT with postsurgical and post radiation changes bilaterally. Small pulmonary nodules bilaterally are unchanged. 2. No evidence of metastatic disease within the abdomen or pelvis. 3. New mild dilatation of common bile duct and pancreatic duct, potentially related to adjacent duodenal diverticulum. No pancreatic or ampullary mass identified. Correlation with liver function studies recommended. If abnormal, further evaluation with endoscopy should be considered. Electronically Signed   By: Richardean Sale M.D.   On: 03/29/2016 16:49   Ct Abdomen Pelvis W Contrast  Result Date: 03/29/2016 CLINICAL DATA:  Metastatic non-small-cell lung cancer diagnosed in 2002  with multiple recurrences. Restaging post radiation therapy. History of thyroid cancer in 2013. EXAM: CT CHEST, ABDOMEN, AND PELVIS WITH CONTRAST TECHNIQUE: Multidetector CT imaging of the chest, abdomen and pelvis was performed following the standard protocol during bolus administration of intravenous contrast. CONTRAST:  129m ISOVUE-300 IOPAMIDOL (ISOVUE-300) INJECTION 61% COMPARISON:  Prior CTs dated 08/24/2015 and 12/16/2015) FINDINGS: CT CHEST FINDINGS The neck CT findings have been dictated separately. Cardiovascular: There are no significant vascular findings. Stable ductus bump. The heart size is normal. There is no pericardial effusion. Mediastinum/Nodes: There are no enlarged mediastinal, hilar, axillary or internal mammary lymph nodes. There is stable distortion of the left hilum. The right thyroid lobe is surgically absent. The left lobe appears unremarkable. Small hiatal hernia. Lungs/Pleura: Stable volume loss in the left hemithorax with a small amount of loculated pleural fluid posteromedially status post left lower lobectomy. 4 mm left upper lobe nodule  on image 43 is unchanged. There are stable radiation changes medially in the left hemithorax. Focal radiation changes are also present in the right suprahilar region, stable. 4 mm right upper lobe nodule on image 44 is unchanged. No new or enlarging nodules are seen. Musculoskeletal/Chest wall: No chest wall mass or suspicious osseous findings. Stable thoracotomy defects on the left and chronic sternal deformity. CT ABDOMEN AND PELVIS FINDINGS Hepatobiliary: Stable tiny cyst in the left hepatic lobe (image 53). No suspicious hepatic findings. The gallbladder is better distended without wall thickening or stones. There is mild extrahepatic biliary prominence with common bile duct measuring up to 7 mm in diameter. There is an apparent periampullary duodenal diverticulum containing air. Pancreas: No evidence of pancreatic mass, surrounding inflammation  or fluid collection. There is mild prominence of the pancreatic duct which measures up to 6 mm in diameter. This appears new. Spleen: Normal in size without focal abnormality. Adrenals/Urinary Tract: Both adrenal glands appear stable without suspicious findings. The kidneys appear normal without evidence of urinary tract calculus, suspicious lesion or hydronephrosis. No bladder abnormalities are seen. Stomach/Bowel: No evidence of bowel wall thickening, distention or surrounding inflammatory change. Vascular/Lymphatic: There are no enlarged abdominal or pelvic lymph nodes. No significant vascular findings are present. Reproductive: Stable lobularity of the uterus, probably secondary to fibroids. There is potentially some fluid within the endometrial canal, although the appearance is unchanged. No evidence of adnexal mass. Other: No ascites or peritoneal nodularity. Musculoskeletal: No acute or significant osseous findings. Stable chronic compression deformities at L4 and L5. IMPRESSION: 1. Stable chest CT with postsurgical and post radiation changes bilaterally. Small pulmonary nodules bilaterally are unchanged. 2. No evidence of metastatic disease within the abdomen or pelvis. 3. New mild dilatation of common bile duct and pancreatic duct, potentially related to adjacent duodenal diverticulum. No pancreatic or ampullary mass identified. Correlation with liver function studies recommended. If abnormal, further evaluation with endoscopy should be considered. Electronically Signed   By: Richardean Sale M.D.   On: 03/29/2016 16:49   ASSESSMENT AND PLAN: this is a very pleasant 70 years old [place] American female with metastatic non-small cell lung cancer adenocarcinoma, with positive EGFR mutation but also resistant mutation T790M. She has been on treatment with oral Tarceva for more than 6 years.  The patient has EGFR resistant mutation T790M and was treated for the last 8 months with [id number] on a clinical trial  at [facility] but this was discontinued secondary to inability to follow-up with the protocol requirement. She had no evidence for disease progression on that treatment. The patient is currently on Tagrisso 80 mg by mouth daily status post 15 months and tolerating it fairly well with no significant adverse effects. She is doing fine today with no specific complaints. The recent CT scan of the neck, chest, abdomen and pelvis showed no evidence for disease progression. The scan also showed dilation of the common bile duct but the patient is asymptomatic and denied having any abdominal issues. I recommended for her to continue her treatment with Tagrisso with the same dose. I will see her back for follow-up visit in 2 months for reevaluation with repeat blood work.  She was advised to call immediately if she has any concerning symptoms in the interval. The patient voices understanding of current disease status and treatment options and is in agreement with the current care plan.  All questions were answered. The patient knows to call the clinic with any problems, questions or concerns. We  can certainly see the patient much sooner if necessary.  Disclaimer: This note was dictated with voice recognition software. Similar sounding words can inadvertently be transcribed and may not be corrected upon review.

## 2016-04-03 ENCOUNTER — Ambulatory Visit: Payer: Self-pay | Admitting: Internal Medicine

## 2016-04-10 MED FILL — *TAGRISSO 80MG TABLET: 80 | 30 days supply | Qty: 30 | Fill #2

## 2016-05-15 ENCOUNTER — Other Ambulatory Visit: Payer: Self-pay | Admitting: Internal Medicine

## 2016-05-15 DIAGNOSIS — C3411 Malignant neoplasm of upper lobe, right bronchus or lung: Secondary | ICD-10-CM

## 2016-05-15 MED FILL — *TAGRISSO 80MG TABLET: 80 | 30 days supply | Qty: 30 | Fill #0

## 2016-05-28 ENCOUNTER — Telehealth: Payer: Self-pay | Admitting: Internal Medicine

## 2016-05-28 ENCOUNTER — Other Ambulatory Visit (HOSPITAL_BASED_OUTPATIENT_CLINIC_OR_DEPARTMENT_OTHER): Payer: Medicare Other

## 2016-05-28 ENCOUNTER — Ambulatory Visit (HOSPITAL_BASED_OUTPATIENT_CLINIC_OR_DEPARTMENT_OTHER): Payer: Medicare Other | Admitting: Internal Medicine

## 2016-05-28 ENCOUNTER — Encounter: Payer: Self-pay | Admitting: Internal Medicine

## 2016-05-28 VITALS — BP 142/79 | HR 89 | Temp 97.8°F | Resp 18 | Ht 67.0 in | Wt 179.2 lb

## 2016-05-28 DIAGNOSIS — G4700 Insomnia, unspecified: Secondary | ICD-10-CM

## 2016-05-28 DIAGNOSIS — R5383 Other fatigue: Secondary | ICD-10-CM

## 2016-05-28 DIAGNOSIS — C719 Malignant neoplasm of brain, unspecified: Secondary | ICD-10-CM

## 2016-05-28 DIAGNOSIS — C7931 Secondary malignant neoplasm of brain: Secondary | ICD-10-CM | POA: Diagnosis not present

## 2016-05-28 DIAGNOSIS — Z5111 Encounter for antineoplastic chemotherapy: Secondary | ICD-10-CM

## 2016-05-28 DIAGNOSIS — C3411 Malignant neoplasm of upper lobe, right bronchus or lung: Secondary | ICD-10-CM

## 2016-05-28 LAB — COMPREHENSIVE METABOLIC PANEL
ALT: 13 U/L (ref 0–55)
AST: 14 U/L (ref 5–34)
Albumin: 4.2 g/dL (ref 3.5–5.0)
Alkaline Phosphatase: 67 U/L (ref 40–150)
Anion Gap: 10 mEq/L (ref 3–11)
BUN: 13.3 mg/dL (ref 7.0–26.0)
CO2: 25 mEq/L (ref 22–29)
Calcium: 10.1 mg/dL (ref 8.4–10.4)
Chloride: 105 mEq/L (ref 98–109)
Creatinine: 1 mg/dL (ref 0.6–1.1)
EGFR: 66 mL/min/{1.73_m2} — ABNORMAL LOW (ref >90)
Glucose: 88 mg/dl (ref 70–140)
Potassium: 4.2 mEq/L (ref 3.5–5.1)
Sodium: 140 mEq/L (ref 136–145)
Total Bilirubin: 0.61 mg/dL (ref 0.20–1.20)
Total Protein: 7.2 g/dL (ref 6.4–8.3)

## 2016-05-28 LAB — CBC WITH DIFFERENTIAL/PLATELET
BASO%: 0.5 % (ref 0.0–2.0)
Basophils Absolute: 0 10*3/uL (ref 0.0–0.1)
EOS%: 1.2 % (ref 0.0–7.0)
Eosinophils Absolute: 0.1 10*3/uL (ref 0.0–0.5)
HCT: 40.8 % (ref 34.8–46.6)
HGB: 13.3 g/dL (ref 11.6–15.9)
LYMPH%: 9.6 % — ABNORMAL LOW (ref 14.0–49.7)
MCH: 29.9 pg (ref 25.1–34.0)
MCHC: 32.7 g/dL (ref 31.5–36.0)
MCV: 91.3 fL (ref 79.5–101.0)
MONO#: 0.4 10*3/uL (ref 0.1–0.9)
MONO%: 6.8 % (ref 0.0–14.0)
NEUT#: 4.8 10*3/uL (ref 1.5–6.5)
NEUT%: 81.9 % — ABNORMAL HIGH (ref 38.4–76.8)
Platelets: 192 10*3/uL (ref 145–400)
RBC: 4.47 10*6/uL (ref 3.70–5.45)
RDW: 14.4 % (ref 11.2–14.5)
WBC: 5.8 10*3/uL (ref 3.9–10.3)
lymph#: 0.6 10*3/uL — ABNORMAL LOW (ref 0.9–3.3)

## 2016-05-28 NOTE — Progress Notes (Signed)
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  BIOMARKERS TESTING ([facility]): Positive for: EGFR [id number] del, T790M, CTNNB1 D32G, SMAD4 T159f*10 Negative for: RET, ALK, BRAF, KRAS, ERBB2 and MET  PRIOR THERAPY:  1. Status post left lower lobectomy under the care of Dr BArlyce Dicein October 2002. 2. Status post course of concurrent chemoradiation with weekly carboplatin and paclitaxel under the care of Dr SBenay Spiceand Dr WElba Barmanin early 2003. 3. Status post [facility] 4. Status post left occipital craniotomy for tumor resection on September 21, 2005, under the care of Dr CChristella Noa 5. Status post whole brain irradiation under the care of Dr WElba Barman completed November 07, 2005. 6. Status post gamma knife treatment to the resected cavitary [facility] 7. Status post right thyroidectomy with radical neck dissection under the care of Dr. RConstance Holsteron 10/18/2011. 8. Status post excisional biopsy of right cervical lymph node that was consistent with metastatic adenocarcinoma. 9. Tarceva 150 mg by mouth daily with therapy beginning 06/03/2007, discontinued in July 2015 secondary to disease progression. Status post 79 cycles. 10. palliative radiotherapy to the enlarging right upper lobe lung nodule under the care of Dr. MValere Dross [facility]. [id number] 300 mg by mouth daily for 21 days discontinued secondary to study deviation and [facility]. 12. Open reduction and internal  fixation of trimalleolar ankle fracture dislocation with fixation of the fibula as well as medial          malleolus.   CURRENT THERAPY:  1) Tagrisso 80 mg by mouth daily, started 11/25/2014, status post 17 months of treatment.  INTERVAL HISTORY: Valerie SCULLEY70y.o. female returns to the clinic today for follow up visit. The patient is feeling fine today with no specific complaints. She is currently on treatment with Tagrisso 80 mg by mouth daily status post 17 months and she is tolerating her treatment fairly well with no significant adverse effects except for mild fatigue and insomnia. She denied having any significant skin rash or diarrhea. She denied having any significant chest pain, shortness of breath, cough or hemoptysis. She denied having any significant nausea, vomiting, abdominal pain, diarrhea or constipation. She is here today for evaluation with repeat blood work.  MEDICAL HISTORY: Past Medical History:  Diagnosis Date  . Abnormal Pap smear 2006  . Anal fistula   . Ankle fracture   . Anxiety   . Arthritis   . Atrophic vaginitis 2008  . Cataract   . Dementia 2009  . Dyspareunia 2008  . H/O osteoporosis   . H/O varicella   . H/O vitamin D deficiency   . Heart murmur   . History of measles, mumps, or rubella   . History of radiation therapy 07/28/13- 08/10/13   right lung metastasis 5000 cGy 10 sessions  . Hypertension   . [place]  . [place]   . [place]   . Lung metastasis ([facility]    PET scan 05/05/13, RUL lung  nodule  . Metastasis to brain [facility]) dx'd 2008  . 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 Nodule of right lung CT- 06/03/12   RIGHT UPPER LOBE  . Nodule of right lung 06/03/12   Upper Lobe  . On antineoplastic chemotherapy    TARCEVA  . Osteoporosis 2010  . Primary cancer of right upper lobe of lung ([place]) 04/22/2009   Qualifier: Diagnosis of  By: Nils Pyle CMA ([place]), Mearl Latin    . Shortness of breath    hx  lung ca   . Status post chemotherapy 2003   CARBOPLATIN/PACLITAXEL /STATUS POST CLINICAL TRAIL OF CELEBREX AND IRESSA AT [facility] FOR 1 YEAR  . Status post radiation therapy 2003   LEFT LUNG  . Status post radiation therapy 11/07/2005   WHOLE BRAIN: DR Larkin In[facility]  . Status post radiation therapy 06/02/2008   GAMMA KNIFE OF RESECTED CAVITAY  . Thyroid adenoma    ?  [name] Thyroid cancer ([place]) 10/18/11 bx   adenoid nodules   . Yeast infection     ALLERGIES:  has No Known Allergies.  MEDICATIONS:  Current Outpatient Prescriptions  Medication Sig Dispense Refill  . alendronate (FOSAMAX) 70 MG tablet Take 1 tablet (70 mg total) by mouth every 7 (seven) days. Saturdays; Take with a full glass of water on an empty stomach. 4 tablet 12  . Ascorbic Acid (VITAMIN C) 100 MG tablet Take 100 mg by mouth daily.    . cephALEXin (KEFLEX) 500 MG capsule     . cholecalciferol (VITAMIN D) 1000 UNITS tablet Take 1,000 Units by mouth at bedtime.    . donepezil (ARICEPT) 10 MG tablet Take 10 mg by mouth daily.  0  . loperamide (IMODIUM) 2 MG capsule Take 2 mg by mouth as needed for diarrhea or loose stools.     . Multiple Vitamin (MULTI-VITAMINS) TABS Take 1 tablet by mouth daily.     . naproxen sodium (ANAPROX) 220 MG tablet Take 220 mg by mouth 2 (two) times daily as needed (pain). Reported on 11/22/2015    . Osimertinib Mesylate (TAGRISSO PO)   2  . sulfamethoxazole-trimethoprim (BACTRIM DS,SEPTRA DS) 800-160 MG tablet     . TAGRISSO 80 MG tablet TAKE 1 TABLET BY MOUTH ONCE DAILY 30 tablet 2  . trimethoprim (TRIMPEX) 100 MG tablet     . XARELTO 20 MG TABS tablet      No current facility-administered medications for this visit.     SURGICAL HISTORY:  Past Surgical History:  Procedure Laterality Date  . BRAIN SURGERY    . LEFT LOWER LOBECTOMY  05/2001  . LEFT OCCIPITAL CRANIOTOMY  09/21/2005   tumor  . LUNG LOBECTOMY    . NECK SURGERY    . ORIF ANKLE FRACTURE Left 10/02/2014   Procedure: OPEN REDUCTION  INTERNAL FIXATION (ORIF) ANKLE FRACTURE;  Surgeon: Alta Corning, MD;  Location: [facility];  Service: Orthopedics;  Laterality: Left;  . RADICAL NECK DISSECTION  10/18/2011   Procedure: RADICAL NECK DISSECTION;  Surgeon: Izora Gala, MD;  Location: [place];  Service: ENT;  Laterality: Right;  RIGHT MODIFIED NECK DISSECTION /POSSIBLE RIGHT THYROIDECTOM  . THYROIDECTOMY  10/18/2011   Procedure: THYROIDECTOMY WITH RADICAL NECK DISSECTION;  Surgeon: Izora Gala, MD;  Location: [place];  Service: ENT;  Laterality: Right;    REVIEW OF SYSTEMS:  A comprehensive review of systems was negative except for: Constitutional: positive for fatigue Musculoskeletal: positive for muscle weakness Behavioral/Psych: positive for sleep disturbance   PHYSICAL  EXAMINATION: General appearance: alert, cooperative, fatigued and no distress Head: Normocephalic, without obvious abnormality, atraumatic Neck: no adenopathy Lymph nodes: Cervical, supraclavicular, and axillary nodes normal. Resp: clear to auscultation bilaterally Back: symmetric, no curvature. ROM normal. No CVA tenderness. Cardio: regular rate and rhythm, S1, S2 normal, no murmur, click, rub or gallop GI: soft, non-tender; bowel sounds normal; no masses,  no organomegaly Extremities: extremities normal, atraumatic, no cyanosis or edema Neurologic: Alert and oriented X 3, normal strength and tone. Normal symmetric reflexes. Normal coordination and gait  ECOG PERFORMANCE STATUS: 1 - Symptomatic but completely ambulatory  Blood pressure (!) 142/79, pulse 89, temperature 97.8 F (36.6 C), temperature source Oral, resp. rate 18, height 5' 7" (1.702 m), weight 179 lb 3.2 oz (81.3 kg), SpO2 100 %.  LABORATORY DATA: Lab Results  Component Value Date   WBC 5.8 05/28/2016   HGB 13.3 05/28/2016   HCT 40.8 05/28/2016   MCV 91.3 05/28/2016   PLT 192 05/28/2016      Chemistry      Component Value Date/Time   NA 141 03/29/2016 1146   K 4.2 03/29/2016 1146   CL  102 08/29/2015 0505   CL 106 12/09/2012 0806   CO2 25 03/29/2016 1146   BUN 19.1 03/29/2016 1146   CREATININE 1.0 03/29/2016 1146      Component Value Date/Time   CALCIUM 9.8 03/29/2016 1146   ALKPHOS 55 03/29/2016 1146   AST 13 03/29/2016 1146   ALT 12 03/29/2016 1146   BILITOT 0.80 03/29/2016 1146       RADIOGRAPHIC STUDIES: No results found. ASSESSMENT AND PLAN: this is a very pleasant 70 years old African American female with metastatic non-small cell lung cancer adenocarcinoma, with positive EGFR mutation but also resistant mutation T790M. She has been on treatment with oral Tarceva for more than 6 years.  The patient has EGFR resistant mutation T790M and was treated for the last 8 months with [id number] on a clinical trial at [facility] but this was discontinued secondary to inability to follow-up with the protocol requirement. She had no evidence for disease progression on that treatment. The patient is currently on Tagrisso 80 mg by mouth daily status post 17 months and tolerating it fairly well with no significant adverse effects. She is doing fine today with no specific complaints except for fatigue. I recommended for her to continue her treatment with Tagrisso with the same dose. I will see her back for follow-up visit in 2 months for reevaluation with repeat blood work as well as CT scan of the chest, abdomen and pelvis for restaging of her disease.  She was advised to call immediately if she has any concerning symptoms in the interval. The patient voices understanding of current disease status and treatment options and is in agreement with the current care plan.  All questions were answered. The patient knows to call the clinic with any problems, questions or concerns. We can certainly see the patient much sooner if necessary.  Disclaimer: This note was dictated with voice recognition software. Similar sounding words can inadvertently be transcribed and may not be corrected  upon review.

## 2016-05-28 NOTE — Telephone Encounter (Signed)
Patient refused contrast, stated that she will get the water based contrast on the day of her CT appointment. Avs report and appointment schedule given to patient, per 05/28/16 los.

## 2016-06-19 MED FILL — TAGRISSO 80 MG TABLET: 80 | 30 days supply | Qty: 30 | Fill #1

## 2016-07-09 ENCOUNTER — Telehealth: Payer: Self-pay | Admitting: *Deleted

## 2016-07-09 NOTE — Telephone Encounter (Signed)
I need to be connected to Dr. Worthy Flank nurse Diane.  I have not heard if the scans have been authorized and they are not yet scheduled."  Provided Radiology Centralized Scheduling Team number.

## 2016-07-19 MED FILL — TAGRISSO 80 MG TABLET: 80 | 30 days supply | Qty: 30 | Fill #2

## 2016-07-20 ENCOUNTER — Other Ambulatory Visit (HOSPITAL_BASED_OUTPATIENT_CLINIC_OR_DEPARTMENT_OTHER): Payer: Medicare Other

## 2016-07-20 ENCOUNTER — Ambulatory Visit (HOSPITAL_COMMUNITY)
Admission: RE | Admit: 2016-07-20 | Discharge: 2016-07-20 | Disposition: A | Discharge status: Home / Self Care | Payer: Medicare Other | Source: Ambulatory Visit | Attending: Internal Medicine | Admitting: Internal Medicine

## 2016-07-20 DIAGNOSIS — Z902 Acquired absence of lung [part of]: Secondary | ICD-10-CM | POA: Insufficient documentation

## 2016-07-20 DIAGNOSIS — M47896 Other spondylosis, lumbar region: Secondary | ICD-10-CM | POA: Insufficient documentation

## 2016-07-20 DIAGNOSIS — I517 Cardiomegaly: Secondary | ICD-10-CM | POA: Insufficient documentation

## 2016-07-20 DIAGNOSIS — D259 Leiomyoma of uterus, unspecified: Secondary | ICD-10-CM | POA: Insufficient documentation

## 2016-07-20 DIAGNOSIS — J90 Pleural effusion, not elsewhere classified: Secondary | ICD-10-CM | POA: Diagnosis not present

## 2016-07-20 DIAGNOSIS — I722 Aneurysm of renal artery: Secondary | ICD-10-CM | POA: Diagnosis not present

## 2016-07-20 DIAGNOSIS — C3411 Malignant neoplasm of upper lobe, right bronchus or lung: Secondary | ICD-10-CM | POA: Diagnosis not present

## 2016-07-20 DIAGNOSIS — C3490 Malignant neoplasm of unspecified part of unspecified bronchus or lung: Secondary | ICD-10-CM | POA: Diagnosis not present

## 2016-07-20 DIAGNOSIS — R918 Other nonspecific abnormal finding of lung field: Secondary | ICD-10-CM | POA: Diagnosis not present

## 2016-07-20 DIAGNOSIS — I700 Atherosclerosis of aorta: Secondary | ICD-10-CM | POA: Insufficient documentation

## 2016-07-20 DIAGNOSIS — Z5111 Encounter for antineoplastic chemotherapy: Secondary | ICD-10-CM

## 2016-07-20 DIAGNOSIS — Z85850 Personal history of malignant neoplasm of thyroid: Secondary | ICD-10-CM | POA: Diagnosis not present

## 2016-07-20 DIAGNOSIS — M4856XA Collapsed vertebra, not elsewhere classified, lumbar region, initial encounter for fracture: Secondary | ICD-10-CM | POA: Insufficient documentation

## 2016-07-20 DIAGNOSIS — M5136 Other intervertebral disc degeneration, lumbar region: Secondary | ICD-10-CM | POA: Insufficient documentation

## 2016-07-20 LAB — CBC WITH DIFFERENTIAL/PLATELET
BASO%: 0.4 % (ref 0.0–2.0)
Basophils Absolute: 0 10*3/uL (ref 0.0–0.1)
EOS%: 2.9 % (ref 0.0–7.0)
Eosinophils Absolute: 0.1 10*3/uL (ref 0.0–0.5)
HCT: 40.8 % (ref 34.8–46.6)
HGB: 13.1 g/dL (ref 11.6–15.9)
LYMPH%: 12.2 % — ABNORMAL LOW (ref 14.0–49.7)
MCH: 29.3 pg (ref 25.1–34.0)
MCHC: 32.1 g/dL (ref 31.5–36.0)
MCV: 91.3 fL (ref 79.5–101.0)
MONO#: 0.4 10*3/uL (ref 0.1–0.9)
MONO%: 9 % (ref 0.0–14.0)
NEUT#: 3.6 10*3/uL (ref 1.5–6.5)
NEUT%: 75.5 % (ref 38.4–76.8)
Platelets: 190 10*3/uL (ref 145–400)
RBC: 4.47 10*6/uL (ref 3.70–5.45)
RDW: 14.8 % — ABNORMAL HIGH (ref 11.2–14.5)
WBC: 4.8 10*3/uL (ref 3.9–10.3)
lymph#: 0.6 10*3/uL — ABNORMAL LOW (ref 0.9–3.3)

## 2016-07-20 LAB — COMPREHENSIVE METABOLIC PANEL
ALT: 12 U/L (ref 0–55)
AST: 12 U/L (ref 5–34)
Albumin: 4 g/dL (ref 3.5–5.0)
Alkaline Phosphatase: 63 U/L (ref 40–150)
Anion Gap: 10 mEq/L (ref 3–11)
BUN: 14.3 mg/dL (ref 7.0–26.0)
CO2: 23 mEq/L (ref 22–29)
Calcium: 9.9 mg/dL (ref 8.4–10.4)
Chloride: 105 mEq/L (ref 98–109)
Creatinine: 1 mg/dL (ref 0.6–1.1)
EGFR: 64 mL/min/{1.73_m2} — ABNORMAL LOW (ref >90)
Glucose: 91 mg/dl (ref 70–140)
Potassium: 4 mEq/L (ref 3.5–5.1)
Sodium: 139 mEq/L (ref 136–145)
Total Bilirubin: 0.67 mg/dL (ref 0.20–1.20)
Total Protein: 7.1 g/dL (ref 6.4–8.3)

## 2016-07-20 MED ORDER — SODIUM CHLORIDE 0.9 % IJ SOLN
INTRAMUSCULAR | Status: AC
  Filled 2016-07-20: qty 50

## 2016-07-20 MED ORDER — IOPAMIDOL (ISOVUE-300) INJECTION 61%
INTRAVENOUS | Status: AC
  Filled 2016-07-20: qty 100

## 2016-07-20 MED ORDER — IOPAMIDOL (ISOVUE-300) INJECTION 61%
30.0000 mL | Freq: Once | INTRAVENOUS | Status: AC | PRN
  Administered 2016-07-20: 30 mL via ORAL

## 2016-07-20 MED ORDER — IOPAMIDOL (ISOVUE-300) INJECTION 61%
INTRAVENOUS | Status: AC
  Administered 2016-07-20: 30 mL via ORAL
  Filled 2016-07-20: qty 30

## 2016-07-20 MED ORDER — IOPAMIDOL (ISOVUE-300) INJECTION 61%
100.0000 mL | Freq: Once | INTRAVENOUS | Status: AC | PRN
  Administered 2016-07-20: 100 mL via INTRAVENOUS

## 2016-07-23 ENCOUNTER — Ambulatory Visit: Payer: Self-pay | Admitting: Internal Medicine

## 2016-07-24 ENCOUNTER — Telehealth: Payer: Self-pay | Admitting: Internal Medicine

## 2016-07-24 ENCOUNTER — Ambulatory Visit (HOSPITAL_BASED_OUTPATIENT_CLINIC_OR_DEPARTMENT_OTHER): Payer: Medicare Other | Admitting: Internal Medicine

## 2016-07-24 ENCOUNTER — Encounter: Payer: Self-pay | Admitting: Internal Medicine

## 2016-07-24 VITALS — BP 132/68 | HR 97 | Temp 97.7°F | Resp 18 | Ht 67.0 in | Wt 178.2 lb

## 2016-07-24 DIAGNOSIS — Z5111 Encounter for antineoplastic chemotherapy: Secondary | ICD-10-CM

## 2016-07-24 DIAGNOSIS — C7931 Secondary malignant neoplasm of brain: Secondary | ICD-10-CM | POA: Diagnosis not present

## 2016-07-24 DIAGNOSIS — R519 Headache, unspecified: Secondary | ICD-10-CM

## 2016-07-24 DIAGNOSIS — R51 Headache: Secondary | ICD-10-CM | POA: Diagnosis not present

## 2016-07-24 DIAGNOSIS — C3411 Malignant neoplasm of upper lobe, right bronchus or lung: Secondary | ICD-10-CM | POA: Diagnosis not present

## 2016-07-24 DIAGNOSIS — R531 Weakness: Secondary | ICD-10-CM

## 2016-07-24 DIAGNOSIS — C719 Malignant neoplasm of brain, unspecified: Secondary | ICD-10-CM

## 2016-07-24 HISTORY — DX: Headache, unspecified: R51.9

## 2016-07-24 NOTE — Progress Notes (Signed)
[facility] [phone]   [phone]  OFFICE PROGRESS NOTE  [name], MD [address] [place] [address]  PRINCIPAL DIAGNOSIS:  metastatic non-small cell lung cancer, adenocarcinoma with brain metastases initially diagnosed as a stage IIb in October 2002.   BIOMARKERS TESTING ([facility]): Positive for: EGFR [id number] del, T790M, CTNNB1 D32G, SMAD4 T128f*10 Negative for: RET, ALK, BRAF, KRAS, ERBB2 and MET  PRIOR THERAPY:  1. Status post left lower lobectomy under the care of Dr BArlyce Dicein October 2002. 2. Status post course of concurrent chemoradiation with weekly carboplatin and paclitaxel under the care of Dr SBenay Spiceand Dr WElba Barmanin early 2003. 3. Status post treatment [facility] 4. Status post left occipital craniotomy for tumor resection on September 21, 2005, under the care of Dr CChristella Noa 5. Status post whole brain irradiation under the care of Dr WElba Barman completed November 07, 2005. 6. Status post gamma knife treatment to the resected [facility] 7. Status post right thyroidectomy with radical neck dissection under the care of Dr. RConstance Holsteron 10/18/2011. 8. Status post excisional biopsy of right cervical lymph node that was consistent with metastatic adenocarcinoma. 9. Tarceva 150 mg by mouth daily with therapy beginning 06/03/2007, discontinued in July 2015 secondary to disease progression. Status post 79 cycles. 10. palliative radiotherapy to the enlarging right upper lobe lung nodule under the care of Dr. MValere Dross [facility]. [id number] 300 mg by mouth daily for 21 days discontinued secondary to study deviation [facility]. 12. Open reduction and  internal fixation of trimalleolar ankle fracture dislocation with fixation of the fibula as well as medial          malleolus.   CURRENT THERAPY: Tagrisso 80 mg by mouth daily started 11/25/2014, status post 19 months of treatment.   INTERVAL HISTORY: Valerie [id number]. female returns to the clinic today for two-month follow-up visit. The patient complains today of new intermittent headache on the right side of her head. She also has a wobbly gait today. She is feeling fine except for generalized fatigue and weakness. She denied having any chest pain, shortness of breath, cough or hemoptysis. She has no nausea or vomiting. She denied having any significant skin rash or diarrhea. She is tolerating her treatment with Tagrisso fairly well. She has repeat CT scan of the chest, abdomen and pelvis performed recently and she is here for evaluation and discussion of her scan results.  MEDICAL HISTORY: Past Medical History:  Diagnosis Date  . Abnormal Pap smear 2006  . Anal fistula   . Ankle fracture   . Anxiety   . Arthritis   . Atrophic vaginitis 2008  . Cataract   . Dementia 2009  . Dyspareunia 2008  . H/O osteoporosis   . H/O varicella   . H/O vitamin D deficiency   . Heart murmur   . History of measles, mumps, or rubella   . History of radiation therapy 07/28/13- 08/10/13   right lung metastasis 5000 cGy 10 sessions  . Hypertension   . [place] [place]  . [place]   . [place]   . [place] [place]  PET scan 05/05/13, RUL lung nodule  . Metastasis to brain [facility]) dx'd 2008  . 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 Nodule of right lung CT- 06/03/12   RIGHT UPPER LOBE  . Nodule of right lung 06/03/12   Upper Lobe  . On antineoplastic chemotherapy    TARCEVA  . Osteoporosis 2010  . Primary cancer of right upper lobe of lung ([place]) 04/22/2009   Qualifier: Diagnosis of  By: Nils Pyle CMA ([place]), Mearl Latin    . Shortness of breath     hx lung ca   . Status post chemotherapy 2003   CARBOPLATIN/PACLITAXEL /STATUS POST CLINICAL TRAIL OF CELEBREX AND IRESSA AT [facility] FOR 1 YEAR  . Status post radiation therapy 2003   LEFT LUNG  . Status post radiation therapy 11/07/2005   WHOLE BRAIN: DR Larkin [facility]  . Status post radiation therapy 06/02/2008   GAMMA KNIFE OF RESECTED CAVITAY  . Thyroid adenoma    ?  [name] Thyroid cancer ([place]) 10/18/11 bx   adenoid nodules   . Yeast infection     ALLERGIES:  has No Known Allergies.  MEDICATIONS:  Current Outpatient Prescriptions  Medication Sig Dispense Refill  . alendronate (FOSAMAX) 70 MG tablet Take 1 tablet (70 mg total) by mouth every 7 (seven) days. Saturdays; Take with a full glass of water on an empty stomach. 4 tablet 12  . Ascorbic Acid (VITAMIN C) 100 MG tablet Take 100 mg by mouth daily.    . cephALEXin (KEFLEX) 500 MG capsule     . cholecalciferol (VITAMIN D) 1000 UNITS tablet Take 1,000 Units by mouth at bedtime.    . donepezil (ARICEPT) 10 MG tablet Take 10 mg by mouth daily.  0  . loperamide (IMODIUM) 2 MG capsule Take 2 mg by mouth as needed for diarrhea or loose stools.     . Multiple Vitamin (MULTI-VITAMINS) TABS Take 1 tablet by mouth daily.     . naproxen sodium (ANAPROX) 220 MG tablet Take 220 mg by mouth 2 (two) times daily as needed (pain). Reported on 11/22/2015    . Osimertinib Mesylate (TAGRISSO PO)   2  . sulfamethoxazole-trimethoprim (BACTRIM DS,SEPTRA DS) 800-160 MG tablet     . TAGRISSO 80 MG tablet TAKE 1 TABLET BY MOUTH ONCE DAILY 30 tablet 2  . trimethoprim (TRIMPEX) 100 MG tablet     . XARELTO 20 MG TABS tablet      No current facility-administered medications for this visit.     SURGICAL HISTORY:  Past Surgical History:  Procedure Laterality Date  . BRAIN SURGERY    . LEFT LOWER LOBECTOMY  05/2001  . LEFT OCCIPITAL CRANIOTOMY  09/21/2005   tumor  . LUNG LOBECTOMY    . NECK SURGERY    . ORIF ANKLE FRACTURE Left 10/02/2014   Procedure: OPEN  REDUCTION INTERNAL FIXATION (ORIF) ANKLE FRACTURE;  Surgeon: Alta Corning, MD;  Location: [facility];  Service: Orthopedics;  Laterality: Left;  . RADICAL NECK DISSECTION  10/18/2011   Procedure: RADICAL NECK DISSECTION;  Surgeon: Izora Gala, MD;  Location: [facility];  Service: ENT;  Laterality: Right;  RIGHT MODIFIED NECK DISSECTION /POSSIBLE RIGHT THYROIDECTOM  . THYROIDECTOMY  10/18/2011   Procedure: THYROIDECTOMY WITH RADICAL NECK DISSECTION;  Surgeon: Izora Gala, MD;  Location: [facility];  Service: ENT;  Laterality: Right;    REVIEW OF SYSTEMS:  Constitutional: positive for fatigue Eyes: negative Ears, nose, mouth, throat, and face: negative Respiratory: negative Cardiovascular: negative Gastrointestinal: negative Genitourinary:negative Integument/breast:  negative Hematologic/lymphatic: negative Musculoskeletal:positive for muscle weakness Neurological: positive for headaches Behavioral/Psych: negative Endocrine: negative Allergic/Immunologic: negative   PHYSICAL EXAMINATION: General appearance: alert, cooperative, fatigued and no distress Head: Normocephalic, without obvious abnormality, atraumatic Neck: no adenopathy, supple, symmetrical, trachea midline and thyroid not enlarged, symmetric, no tenderness/mass/nodules Lymph nodes: Cervical, supraclavicular, and axillary nodes normal. Resp: clear to auscultation bilaterally Back: symmetric, no curvature. ROM normal. No CVA tenderness. Cardio: regular rate and rhythm, S1, S2 normal, no murmur, click, rub or gallop GI: soft, non-tender; bowel sounds normal; no masses,  no organomegaly Extremities: extremities normal, atraumatic, no cyanosis or edema Neurologic: Alert and oriented X 3, normal strength and tone. Normal symmetric reflexes. Normal coordination and gait  ECOG PERFORMANCE STATUS: 1 - Symptomatic but completely ambulatory  Blood pressure 132/68, pulse 97, temperature 97.7 F (36.5 C), temperature source Oral, resp. rate 18,  height _0  (1.702 m), weight 178 lb 3.2 oz (80.8 kg), SpO2 100 %.  LABORATORY DATA: Lab Results  Component Value Date   WBC 4.8 07/20/2016   HGB 13.1 07/20/2016   HCT 40.8 07/20/2016   MCV 91.3 07/20/2016   PLT 190 07/20/2016      Chemistry      Component Value Date/Time   NA 139 07/20/2016 1046   K 4.0 07/20/2016 1046   CL 102 08/29/2015 0505   CL 106 12/09/2012 0806   CO2 23 07/20/2016 1046   BUN 14.3 07/20/2016 1046   CREATININE 1.0 07/20/2016 1046      Component Value Date/Time   CALCIUM 9.9 07/20/2016 1046   ALKPHOS 63 07/20/2016 1046   AST 12 07/20/2016 1046   ALT 12 07/20/2016 1046   BILITOT 0.67 07/20/2016 1046       RADIOGRAPHIC STUDIES: Ct Chest W Contrast  Result Date: 07/20/2016 CLINICAL DATA:  Metastatic non-small cell lung cancer with multiple recurrences. Restaging assessment. Thyroid cancer in 2013. EXAM: CT CHEST, ABDOMEN, AND PELVIS WITH CONTRAST TECHNIQUE: Multidetector CT imaging of the chest, abdomen and pelvis was performed following the standard protocol during bolus administration of intravenous contrast. CONTRAST:  14m ISOVUE-300 IOPAMIDOL (ISOVUE-300) INJECTION 61% COMPARISON:  Multiple exams, including 03/29/2016 FINDINGS: CT CHEST FINDINGS Cardiovascular: Coronary, aortic arch, and branch vessel atherosclerotic vascular disease. Mild cardiomegaly. Mediastinum/Nodes: No pathologic adenopathy in the chest. Fluid in the esophagus without esophageal dilatation or definite esophageal wall thickening. Lungs/Pleura: Small to moderate left pleural effusion. Left lower lobectomy. Mild biapical pleuroparenchymal scarring. 5 by 4 mm right upper lobe pulmonary nodule, image 44/3, stable back through 10/19/2013. 3 mm right lower lobe subpleural nodule, image 60/3, no change from 2015, and stable from the prior exam. 4 mm left upper lobe pulmonary nodule on image 43/3, stable from 10/19/2013. Volume loss and radiation therapy related findings posteriorly in the  left mid hemithorax, image 58/3. Right suprahilar density and volume loss compatible with radiation therapy, fairly similar back through 12/16/2015. Musculoskeletal: Widened left 6th intercostal space as before. Thoracic kyphosis. Stable sclerosis and deformity of the upper sternal body. CT ABDOMEN PELVIS FINDINGS Hepatobiliary: Common bile duct 6 mm in diameter. Gallbladder unremarkable. No distinct hepatic abnormality. Pancreas: Dorsal pancreatic duct in the pancreatic head is 6 mm in diameter, mildly dilated but unchanged from prior. I do not see a definite pancreatic parenchymal lesion. Spleen: Unremarkable Adrenals/Urinary Tract: Slightly thickened left adrenal gland without mass. Small amount of gas in the urinary bladder possibly from recent catheterization. Partial duplication of the left collecting system. Stomach/Bowel: Appendix normal. 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 Not appreciably changed from prior. No pathologic adenopathy. Reproductive: Several myometrial masses, the largest 2.6 cm posteriorly in the uterus on image 1 and 12/2, likely fibroids. No significant adnexal abnormality. Other: No supplemental non-categorized findings. Musculoskeletal: Stable appearance of the superior endplate compression fracture at the L4 level. Subtle superior endplate compression fracture at L5 is chronic. Lumbar spondylosis and mild degenerative disc disease. IMPRESSION: 1. Stable small pulmonary nodules back through at least 10/19/2013. Densely stable radiation therapy related findings in the right suprahilar region and left mid lung. Prior left lower lobectomy. 2. Overall no compelling findings of currently active malignancy in the chest, abdomen, or pelvis. 3. Other imaging findings of potential clinical significance: Coronary, aortic arch, and branch vessel atherosclerotic vascular disease. Mild cardiomegaly. Small to moderate left pleural effusion. Stable  dilation of the dorsal pancreatic duct in the pancreatic head. 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 Uterine fibroids. Chronic endplate compression and L4. Electronically Signed   By: Van Clines M.D.   On: 07/20/2016 16:47   Ct Abdomen Pelvis W Contrast  Result Date: 07/20/2016 CLINICAL DATA:  Metastatic non-small cell lung cancer with multiple recurrences. Restaging assessment. Thyroid cancer in 2013. EXAM: CT CHEST, ABDOMEN, AND PELVIS WITH CONTRAST TECHNIQUE: Multidetector CT imaging of the chest, abdomen and pelvis was performed following the standard protocol during bolus administration of intravenous contrast. CONTRAST:  194m ISOVUE-300 IOPAMIDOL (ISOVUE-300) INJECTION 61% COMPARISON:  Multiple exams, including 03/29/2016 FINDINGS: CT CHEST FINDINGS Cardiovascular: Coronary, aortic arch, and branch vessel atherosclerotic vascular disease. Mild cardiomegaly. Mediastinum/Nodes: No pathologic adenopathy in the chest. Fluid in the esophagus without esophageal dilatation or definite esophageal wall thickening. Lungs/Pleura: Small to moderate left pleural effusion. Left lower lobectomy. Mild biapical pleuroparenchymal scarring. 5 by 4 mm right upper lobe pulmonary nodule, image 44/3, stable back through 10/19/2013. 3 mm right lower lobe subpleural nodule, image 60/3, no change from 2015, and stable from the prior exam. 4 mm left upper lobe pulmonary nodule on image 43/3, stable from 10/19/2013. Volume loss and radiation therapy related findings posteriorly in the left mid hemithorax, image 58/3. Right suprahilar density and volume loss compatible with radiation therapy, fairly similar back through 12/16/2015. Musculoskeletal: Widened left 6th intercostal space as before. Thoracic kyphosis. Stable sclerosis and deformity of the upper sternal body. CT ABDOMEN PELVIS FINDINGS Hepatobiliary: Common bile duct 6 mm in diameter. Gallbladder unremarkable. No distinct hepatic abnormality.  Pancreas: Dorsal pancreatic duct in the pancreatic head is 6 mm in diameter, mildly dilated but unchanged from prior. I do not see a definite pancreatic parenchymal lesion. Spleen: Unremarkable Adrenals/Urinary Tract: Slightly thickened left adrenal gland without mass. Small amount of gas in the urinary bladder possibly from recent catheterization. Partial duplication of the left collecting system. Stomach/Bowel: Appendix normal. 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 Not appreciably changed from prior. No pathologic adenopathy. Reproductive: Several myometrial masses, the largest 2.6 cm posteriorly in the uterus on image 1 and 12/2, likely fibroids. No significant adnexal abnormality. Other: No supplemental non-categorized findings. Musculoskeletal: Stable appearance of the superior endplate compression fracture at the L4 level. Subtle superior endplate compression fracture at L5 is chronic. Lumbar spondylosis and mild degenerative disc disease. IMPRESSION: 1. Stable small pulmonary nodules back through at least 10/19/2013. Densely stable radiation therapy related findings in the right suprahilar region and left mid lung. Prior left lower lobectomy. 2. Overall no compelling findings of currently active malignancy in the chest, abdomen,  or pelvis. 3. Other imaging findings of potential clinical significance: Coronary, aortic arch, and branch vessel atherosclerotic vascular disease. Mild cardiomegaly. Small to moderate left pleural effusion. Stable dilation of the dorsal pancreatic duct in the pancreatic head. 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 Uterine fibroids. Chronic endplate compression and L4. Electronically Signed   By: Van Clines M.D.   On: 07/20/2016 16:47    ASSESSMENT AND PLAN: This is a very pleasant 70 years old African-American female with metastatic non-small cell lung cancer, adenocarcinoma with positive EGFR mutation status  post several years of treatment with Tarceva and the patient developed positive resistant mutation, T790M. She was started on treatment with Tagrisso status post 19 months. She is tolerating her treatment fairly well. She had repeat CT scan of the chest, abdomen and pelvis performed recently. I personally and independently reviewed the scan. It showed no evidence for disease progression. I discussed the scan and lab results with the patient today. I recommended for her to continue her current treatment with Tagrisso. I will see her back for follow-up visit in 2 months for evaluation with repeat blood work. For the new headache and weakness, I will order MRI of the brain to rule out brain metastasis. She was advised to call immediately if she has any concerning symptoms in the interval.  The patient voices understanding of current disease status and treatment options and is in agreement with the current care plan.  All questions were answered. The patient knows to call the clinic with any problems, questions or concerns. We can certainly see the patient much sooner if necessary.  I spent 15 minutes counseling the patient face to face. The total time spent in the appointment was 25 minutes.  Disclaimer: This note was dictated with voice recognition software. Similar sounding words can inadvertently be transcribed and may not be corrected upon review.

## 2016-07-24 NOTE — Telephone Encounter (Signed)
Appointments scheduled per 12/5 LOS. Patient given AVS report and calendars with future scheduled appointments. Patient aware of MRI scan appointment to be scheduled.

## 2016-08-06 ENCOUNTER — Ambulatory Visit (HOSPITAL_COMMUNITY)
Admission: RE | Admit: 2016-08-06 | Discharge: 2016-08-06 | Disposition: A | Discharge status: Home / Self Care | Payer: Medicare Other | Source: Ambulatory Visit | Attending: Internal Medicine | Admitting: Internal Medicine

## 2016-08-06 DIAGNOSIS — Z85841 Personal history of malignant neoplasm of brain: Secondary | ICD-10-CM | POA: Insufficient documentation

## 2016-08-06 DIAGNOSIS — C3411 Malignant neoplasm of upper lobe, right bronchus or lung: Secondary | ICD-10-CM

## 2016-08-06 DIAGNOSIS — C719 Malignant neoplasm of brain, unspecified: Secondary | ICD-10-CM

## 2016-08-06 DIAGNOSIS — Z85118 Personal history of other malignant neoplasm of bronchus and lung: Secondary | ICD-10-CM | POA: Insufficient documentation

## 2016-08-06 DIAGNOSIS — R519 Headache, unspecified: Secondary | ICD-10-CM

## 2016-08-06 DIAGNOSIS — R51 Headache: Secondary | ICD-10-CM | POA: Insufficient documentation

## 2016-08-06 DIAGNOSIS — Z5111 Encounter for antineoplastic chemotherapy: Secondary | ICD-10-CM

## 2016-08-06 MED ORDER — GADOBENATE DIMEGLUMINE 529 MG/ML IV SOLN
20.0000 mL | Freq: Once | INTRAVENOUS | Status: AC | PRN
  Administered 2016-08-06: 17 mL via INTRAVENOUS

## 2016-08-22 ENCOUNTER — Telehealth: Payer: Self-pay | Admitting: Medical Oncology

## 2016-08-22 NOTE — Telephone Encounter (Signed)
MRI is OK.

## 2016-08-22 NOTE — Telephone Encounter (Signed)
requests results-appt in feb

## 2016-08-23 NOTE — Telephone Encounter (Signed)
Husband notified  

## 2016-08-28 ENCOUNTER — Other Ambulatory Visit: Payer: Self-pay | Admitting: Internal Medicine

## 2016-08-28 ENCOUNTER — Other Ambulatory Visit: Payer: Self-pay | Admitting: Medical Oncology

## 2016-08-28 DIAGNOSIS — C7801 Secondary malignant neoplasm of right lung: Secondary | ICD-10-CM

## 2016-08-28 DIAGNOSIS — C3411 Malignant neoplasm of upper lobe, right bronchus or lung: Secondary | ICD-10-CM

## 2016-08-28 DIAGNOSIS — Z5111 Encounter for antineoplastic chemotherapy: Secondary | ICD-10-CM

## 2016-08-28 MED ORDER — OSIMERTINIB MESYLATE 80 MG PO TABS: 80.0000 mg | ORAL_TABLET | Freq: Every day | ORAL | 2 refills | Status: DC

## 2016-08-28 MED FILL — TAGRISSO 80 MG TABLET: 80 | 30 days supply | Qty: 30 | Fill #0

## 2016-09-19 ENCOUNTER — Telehealth: Payer: Self-pay | Admitting: Medical Oncology

## 2016-09-19 NOTE — Telephone Encounter (Signed)
Pt said to cancel her appt . I cancelled appt per pt .scheudle request sent.

## 2016-09-24 MED FILL — TAGRISSO 80 MG TABLET: 80 | 30 days supply | Qty: 30 | Fill #1

## 2016-09-25 ENCOUNTER — Ambulatory Visit: Payer: Self-pay | Admitting: Internal Medicine

## 2016-09-25 ENCOUNTER — Other Ambulatory Visit: Payer: Self-pay

## 2016-09-25 IMAGING — CR DG ANKLE COMPLETE 3+V*L*
3 series · 3 of 3 positions shown · non-contrast
Comparison: None.

CLINICAL DATA: Fall with ankle deformity.  Initial encounter

EXAM:
LEFT ANKLE COMPLETE - 3+ VIEW

[x ankle ap left]
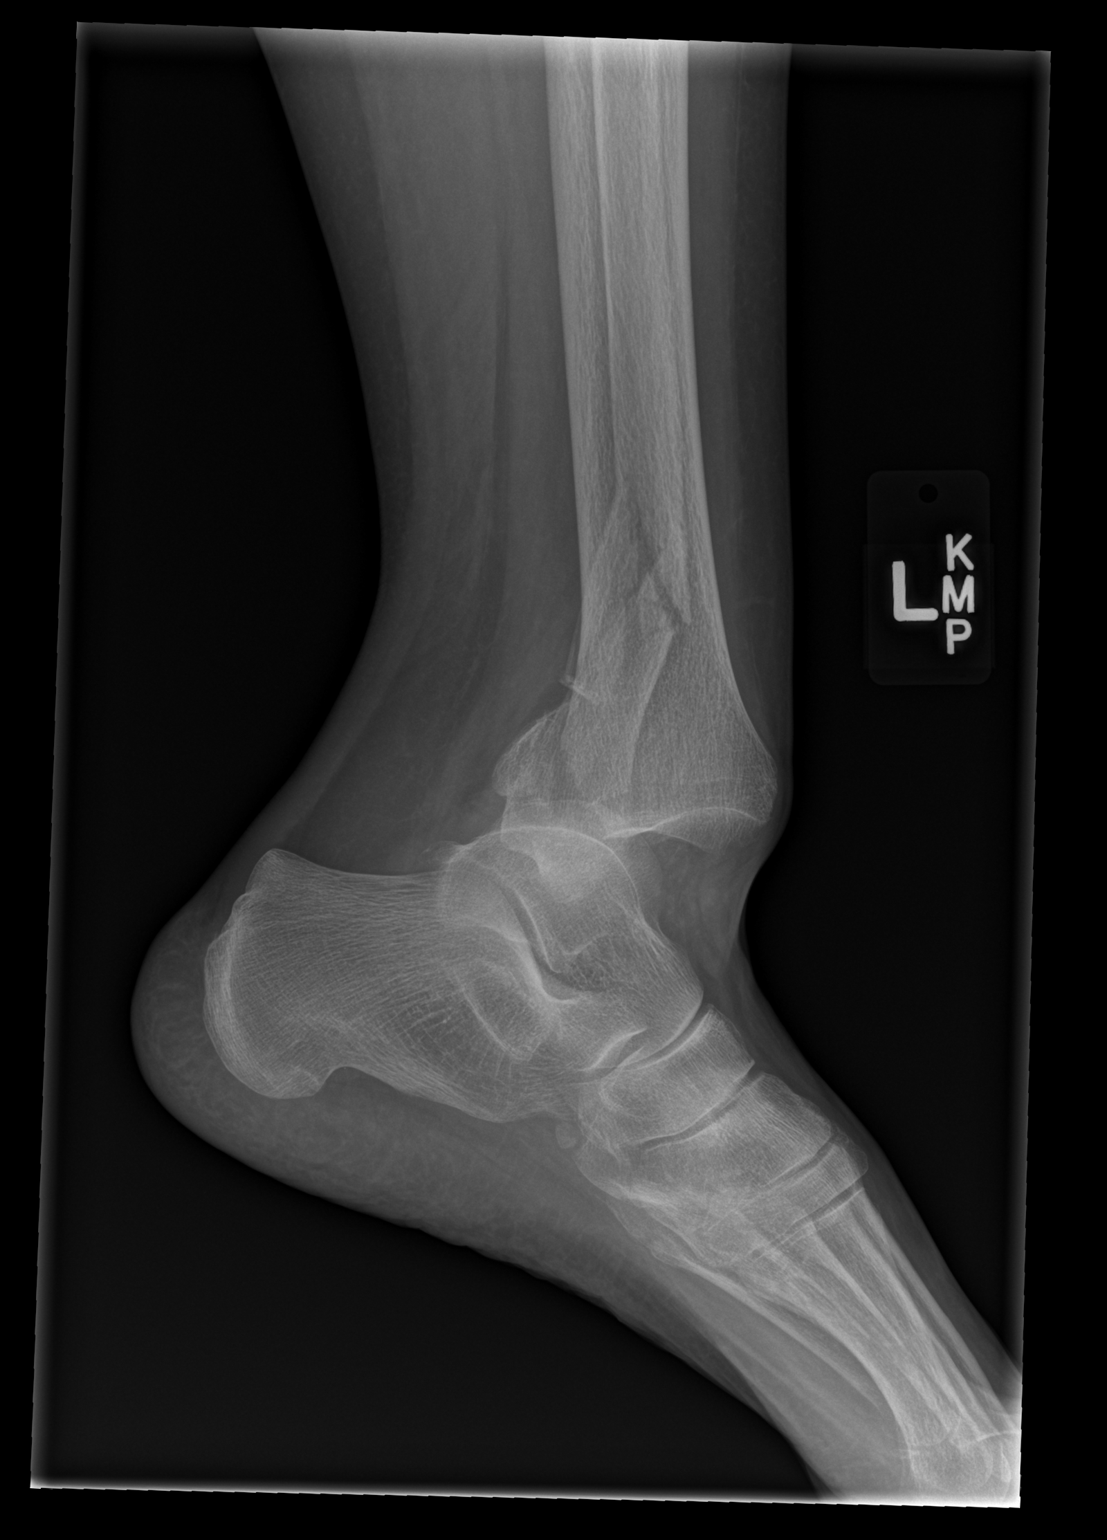

[x ankle obl left]
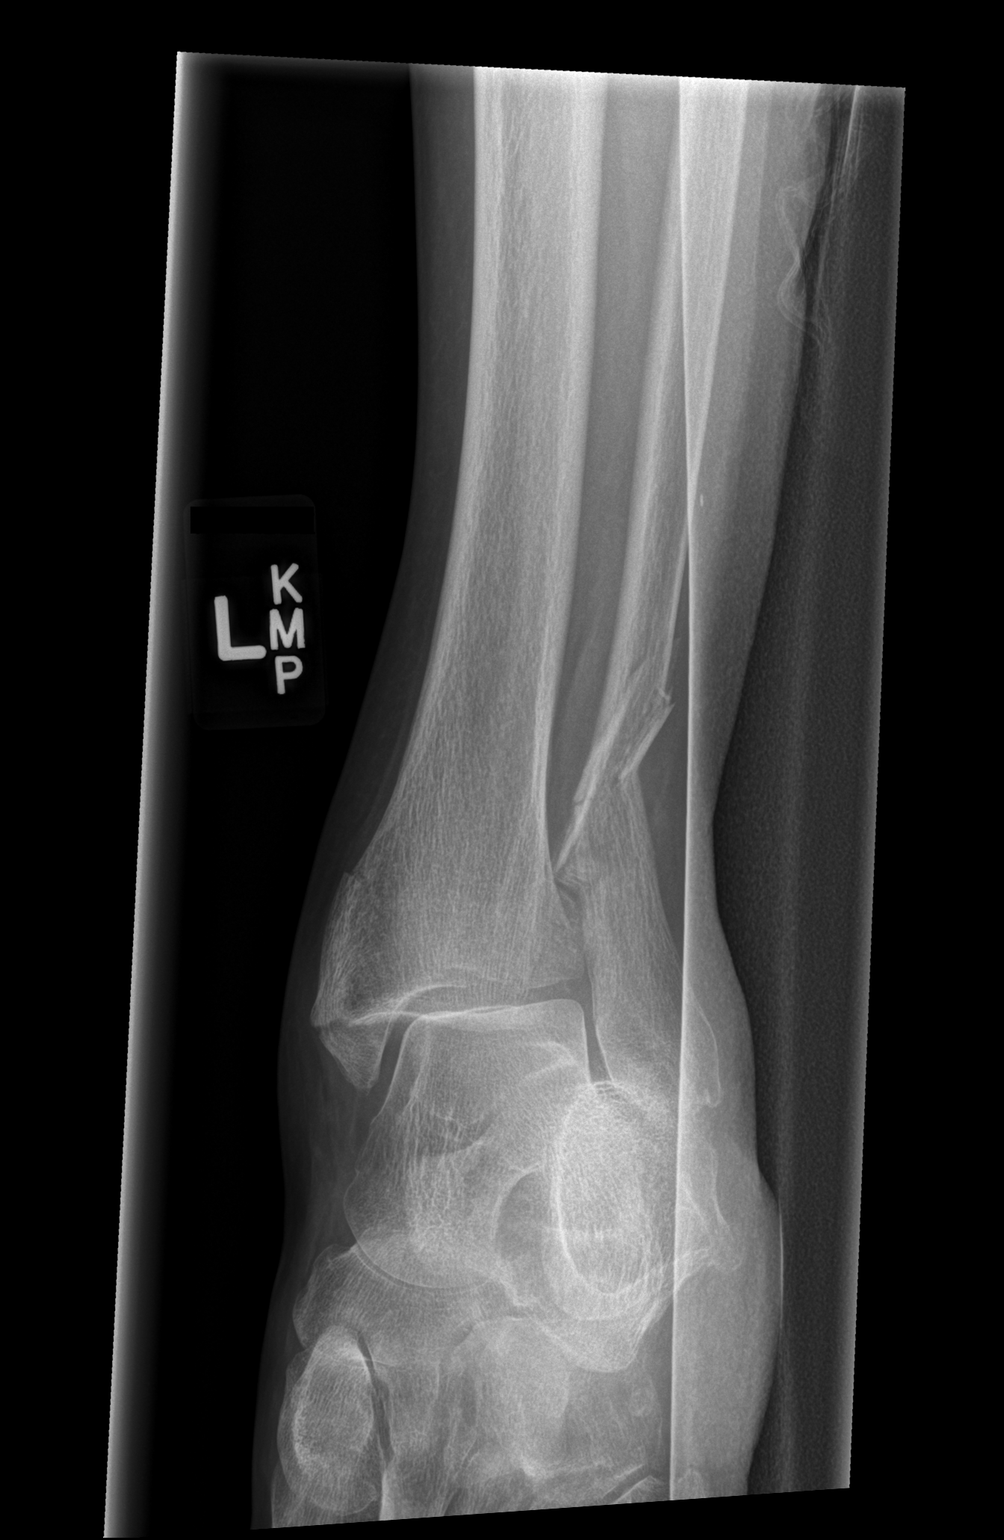

[x ankle lat left]
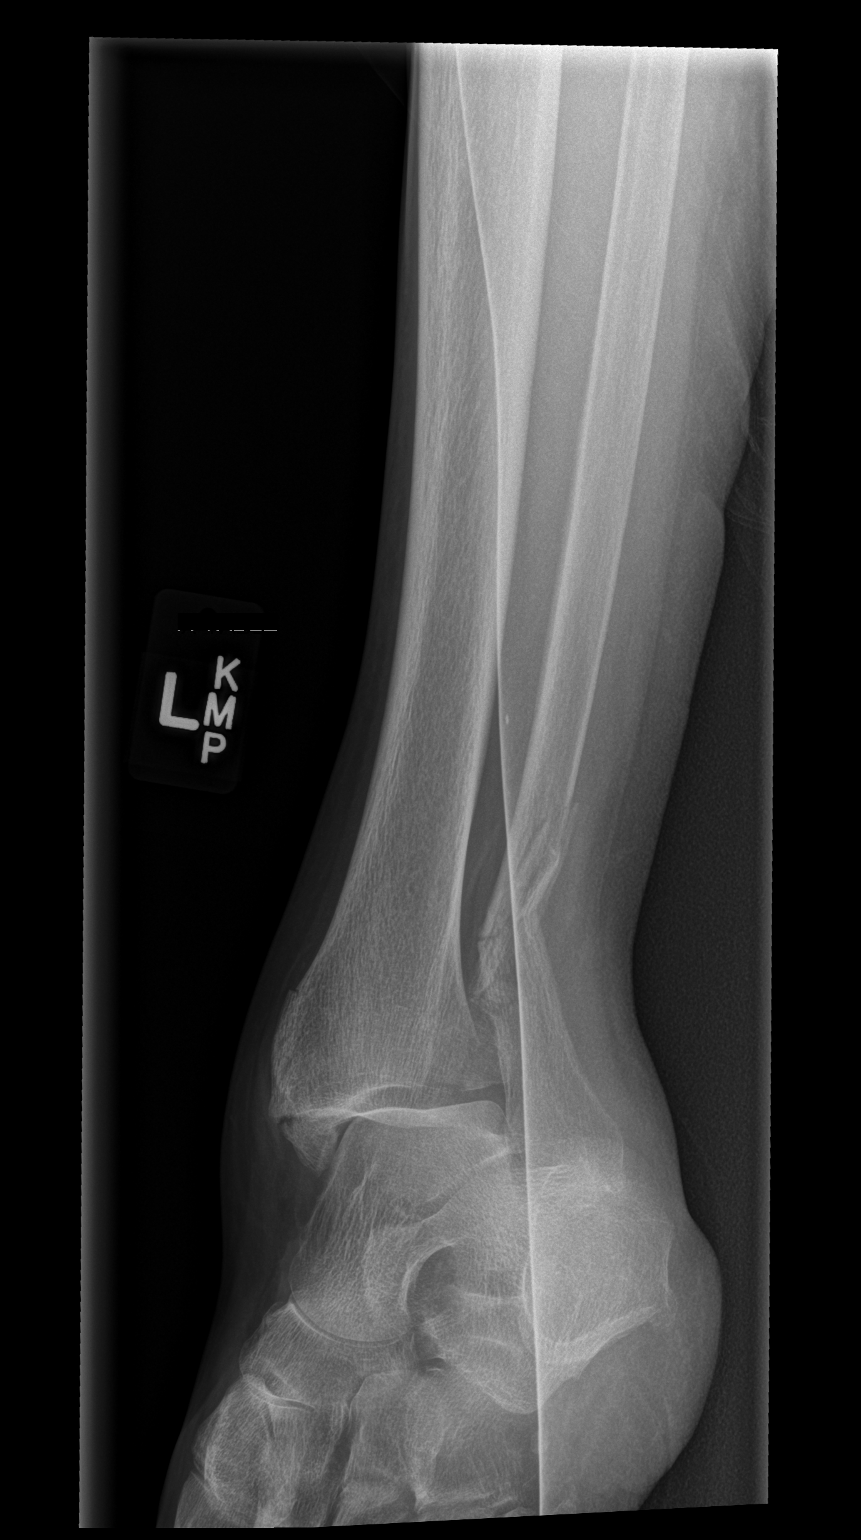

[3 of 3 positions shown; findings below may reference images not displayed]

FINDINGS: Ankle fracture dislocation:

*There is a comminuted, segmental medially and anteriorly angulated
distal fibular diaphysis fracture.
*Posterior malleolus fracture involving 11 mm of the posterior
articular surface, with wide distraction.
*Comminuted medial malleolus fracturing which continues to the
central plafond.
*The ankle is dislocated posteriorly.

No hindfoot fracture or malalignment.
IMPRESSION: Ankle fracture/dislocation, as described above.

## 2016-09-25 IMAGING — CR DG ANKLE 2V *L*
1 series · 2 of 2 positions shown · non-contrast
Comparison: 09/30/2014

CLINICAL DATA: Left ankle fracture dislocation. Followup getting
into a car today. Postreduction images.

EXAM:
LEFT ANKLE - 2 VIEW

[Series 1: AP · left · 2 of 2 slices shown]
[im 1/2]
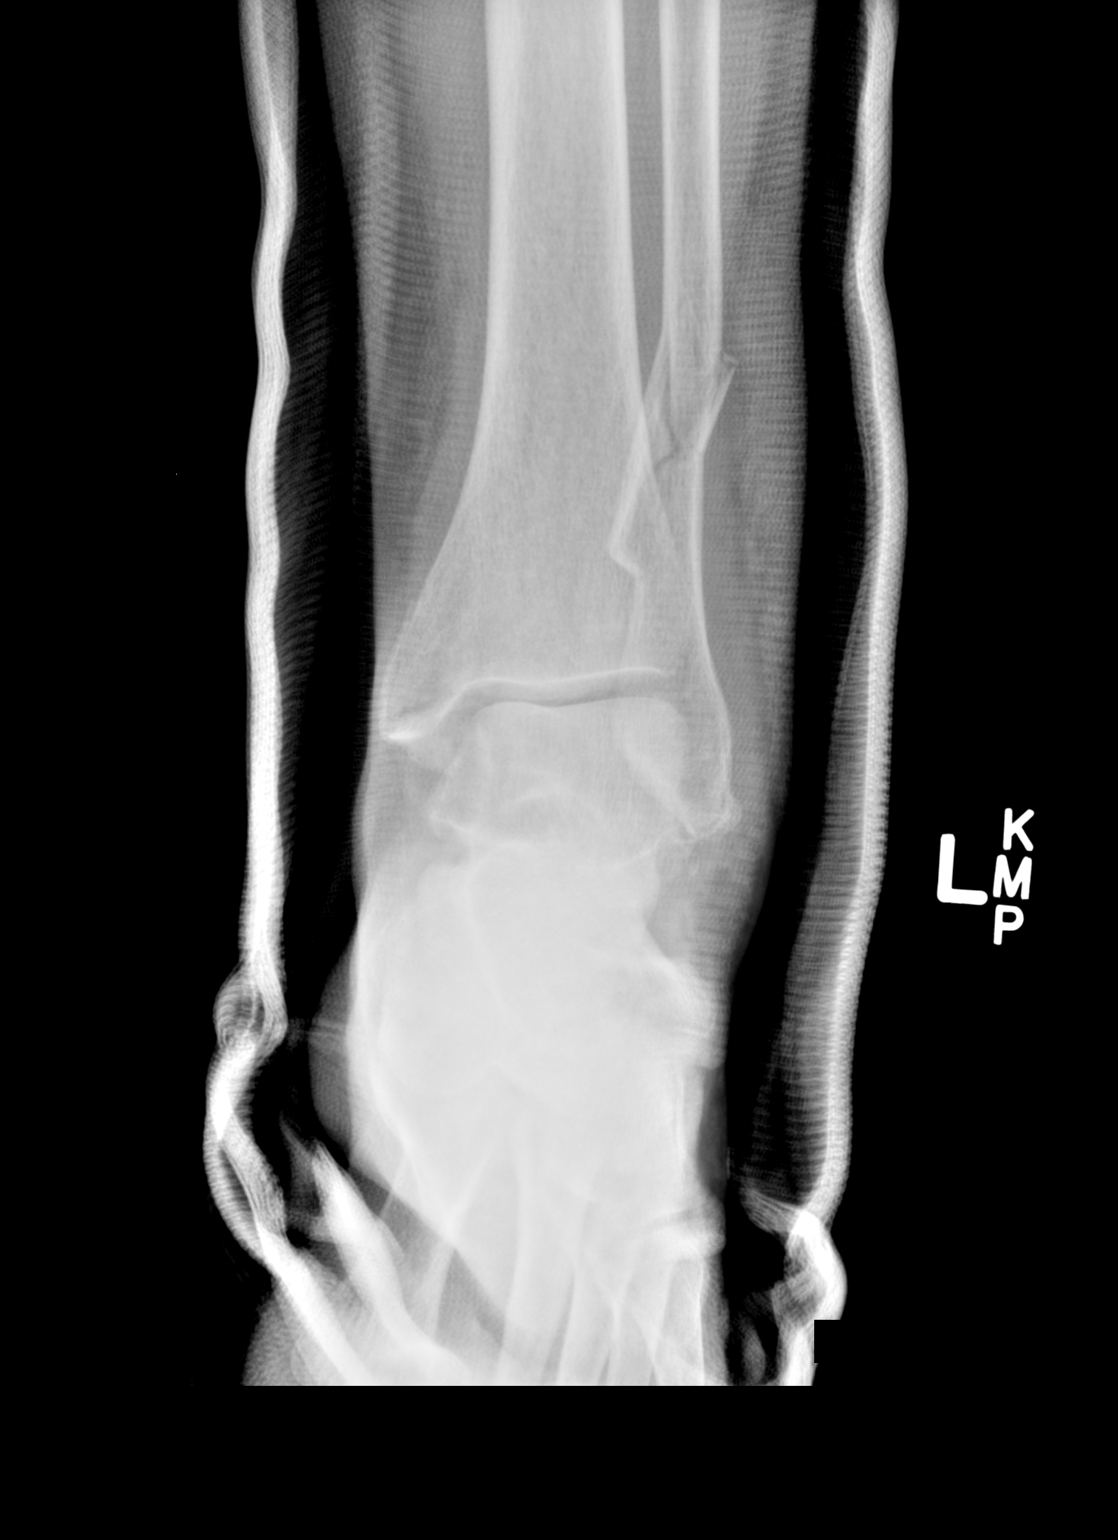
[im 2/2]
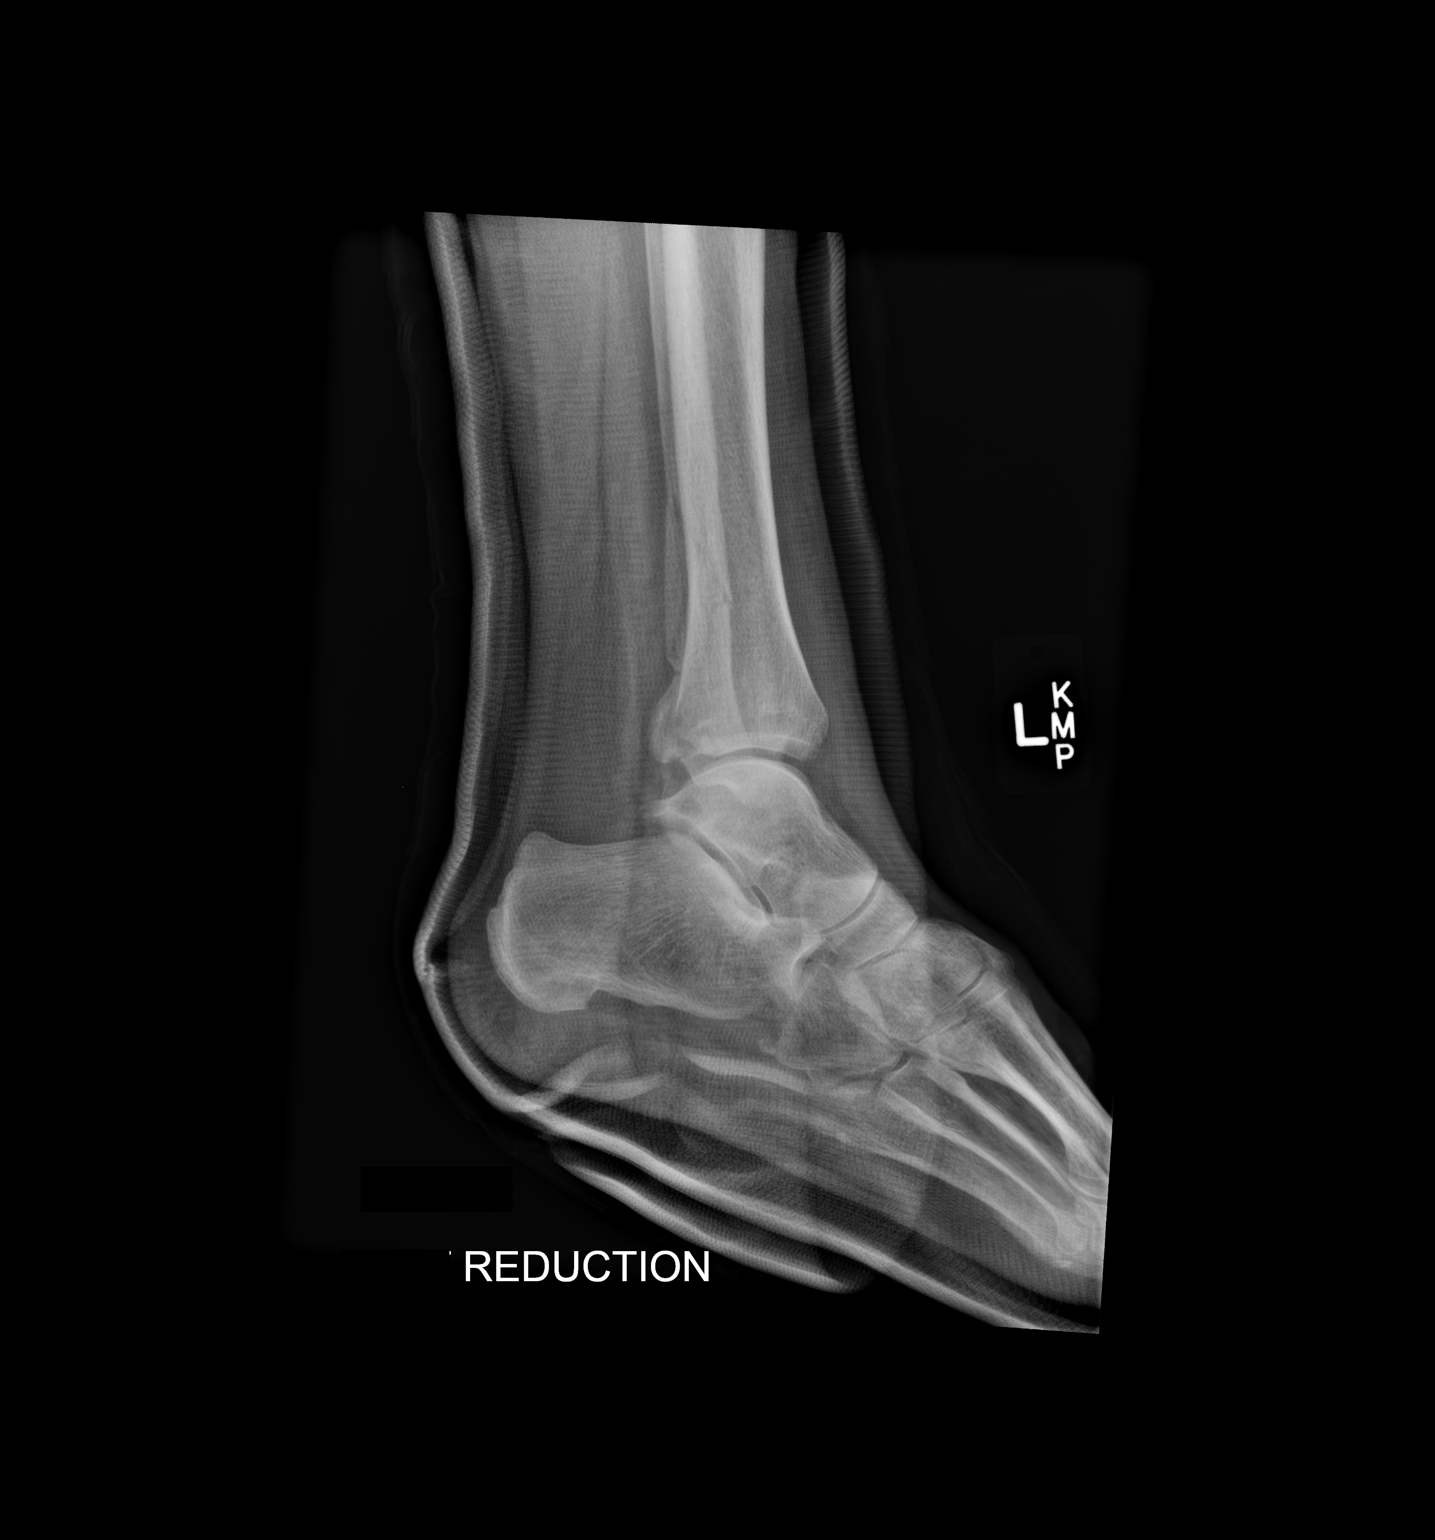

[2 of 2 positions shown; findings below may reference images not displayed]

FINDINGS: Restored tibiotalar alignment. Posterior malleolar fracture with
fractures involving the medial malleolus and segmental fracture of
the distal fibular metadiaphysis. Mortise width uniform. Fiberglass
splint in place and obscures bony detail.
IMPRESSION: 1. Restored tibiotalar alignment. Trimalleolar fracture. Mortise
distance uniform currently.

## 2016-09-25 IMAGING — CR DG CHEST 1V PORT
1 series · 1 of 1 positions shown · non-contrast
Comparison: 01/20/2014

CLINICAL DATA: Recent ankle fracture dislocation with known history
of lung carcinoma

EXAM:
PORTABLE CHEST - 1 VIEW

[AP]
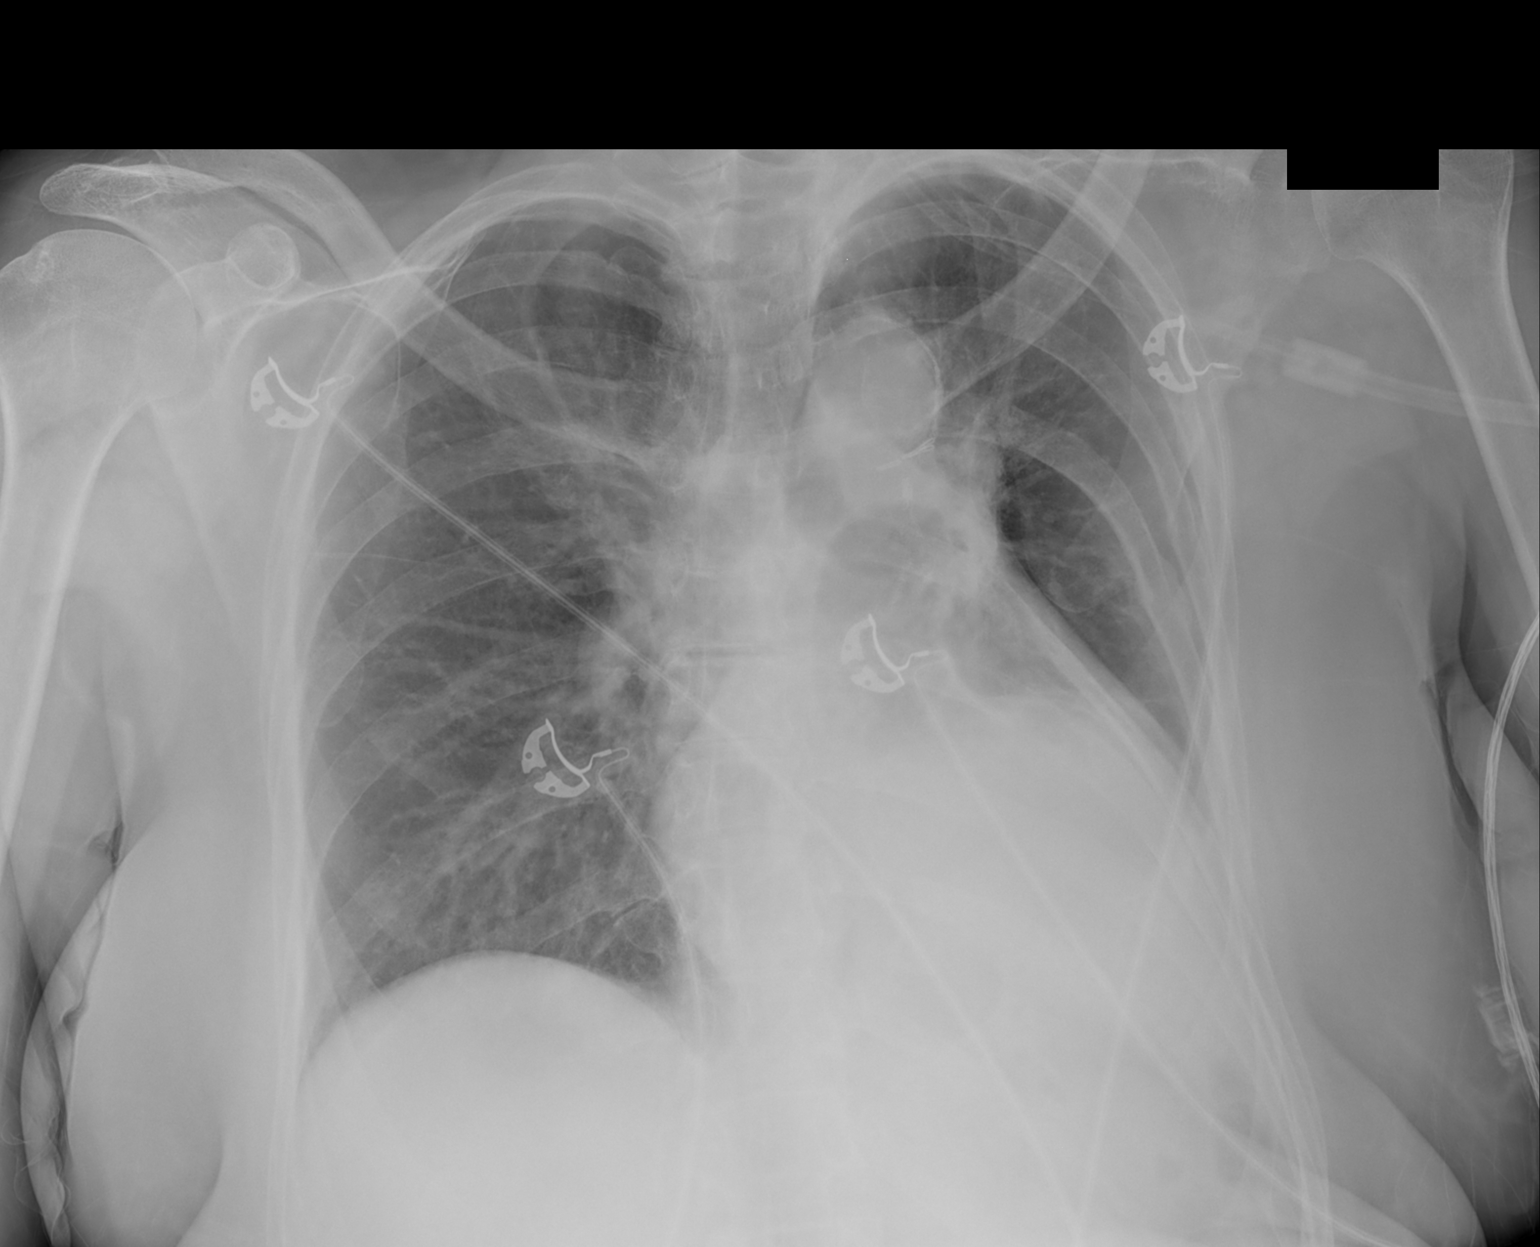

[1 of 1 positions shown; findings below may reference images not displayed]

FINDINGS: Cardiac shadow is mildly enlarged but stable from the prior exam.
Postsurgical changes are again seen in the left hilum. Central post
radiation changes are again noted bilaterally. Elevation of left
hemidiaphragm is again seen. No focal infiltrate or sizable effusion
is noted.
IMPRESSION: Chronic changes related to the patient's known history of lung
carcinoma. No acute abnormality is seen.

## 2016-09-26 IMAGING — CR DG ANKLE COMPLETE 3+V*R*
4 series · 4 of 4 positions shown · non-contrast
Comparison: None.

CLINICAL DATA: 68-year-old female who fell yesterday ray with
casted left ankle fracture. Right lower extremity pain. Initial
encounter.

EXAM:
RIGHT ANKLE - COMPLETE 3+ VIEW

[x ankle ap right]
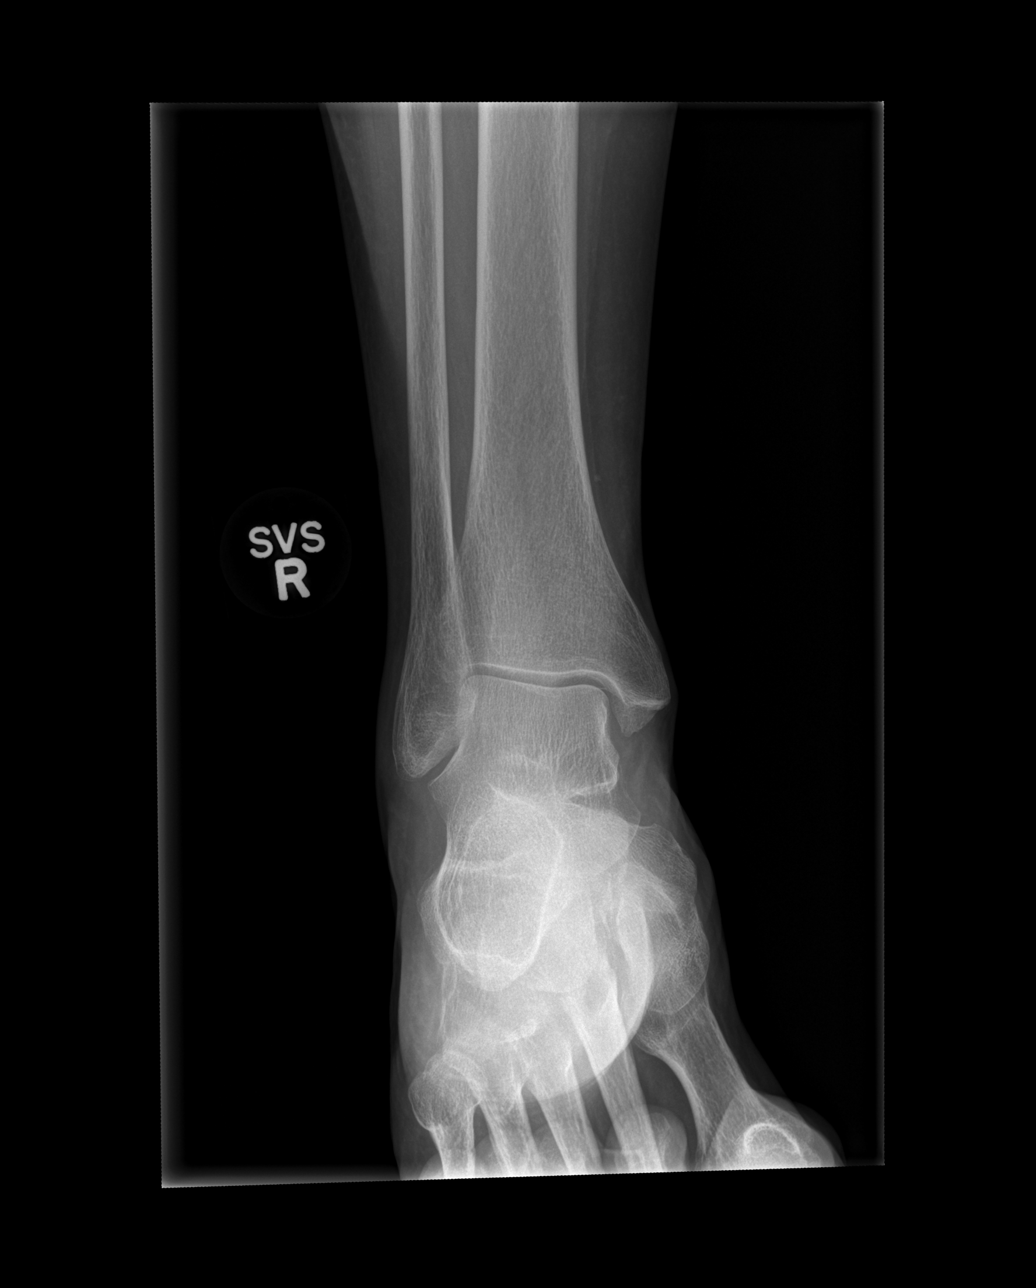

[x ankle obl right]
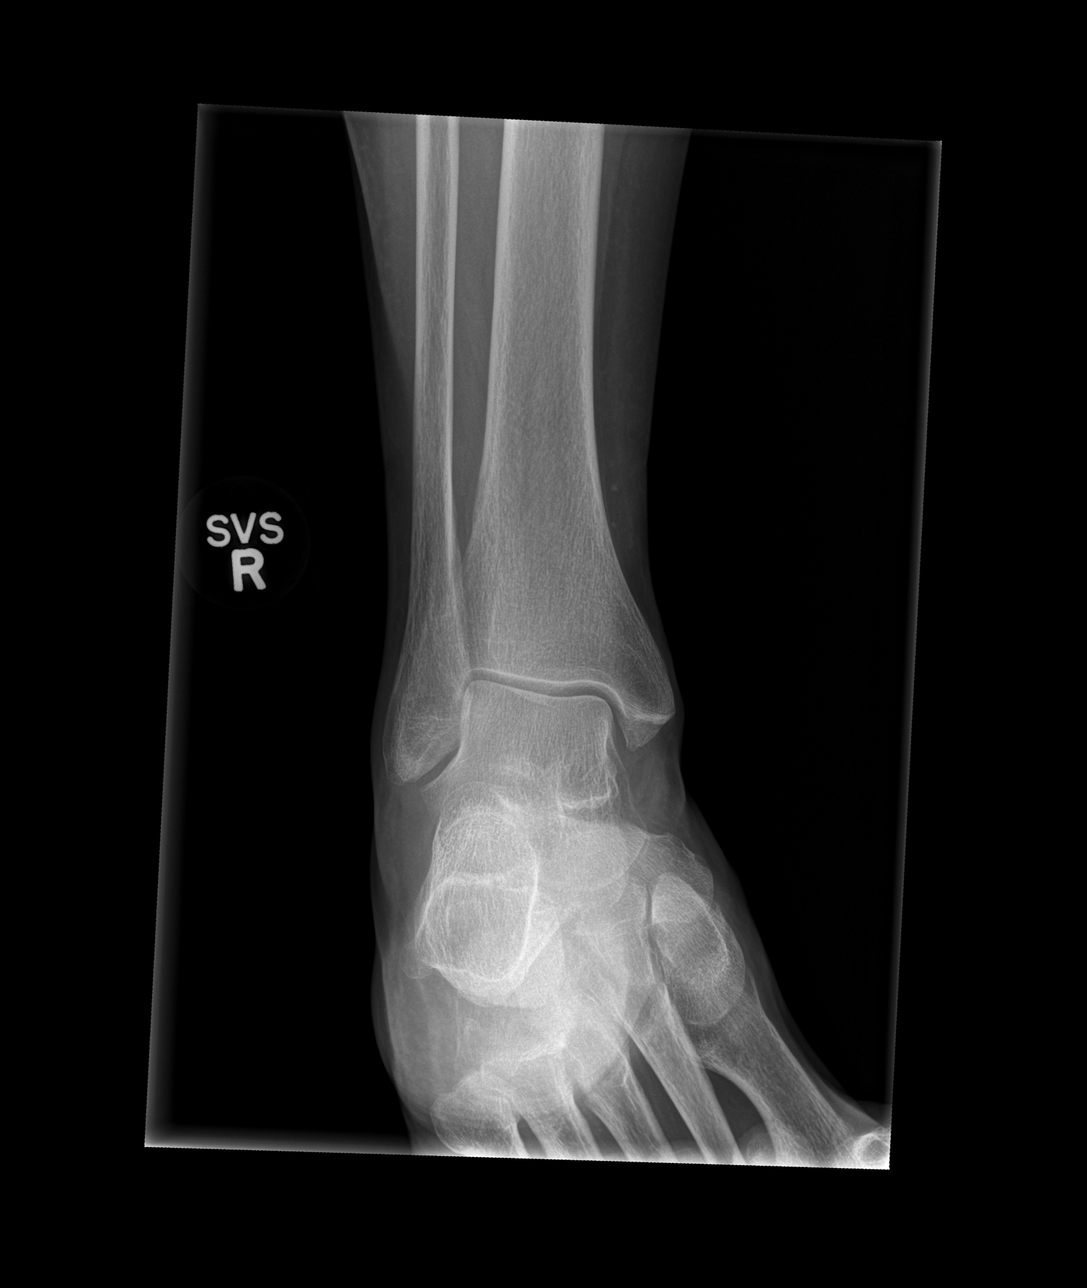

[x ankle lat right (1 of 2)]
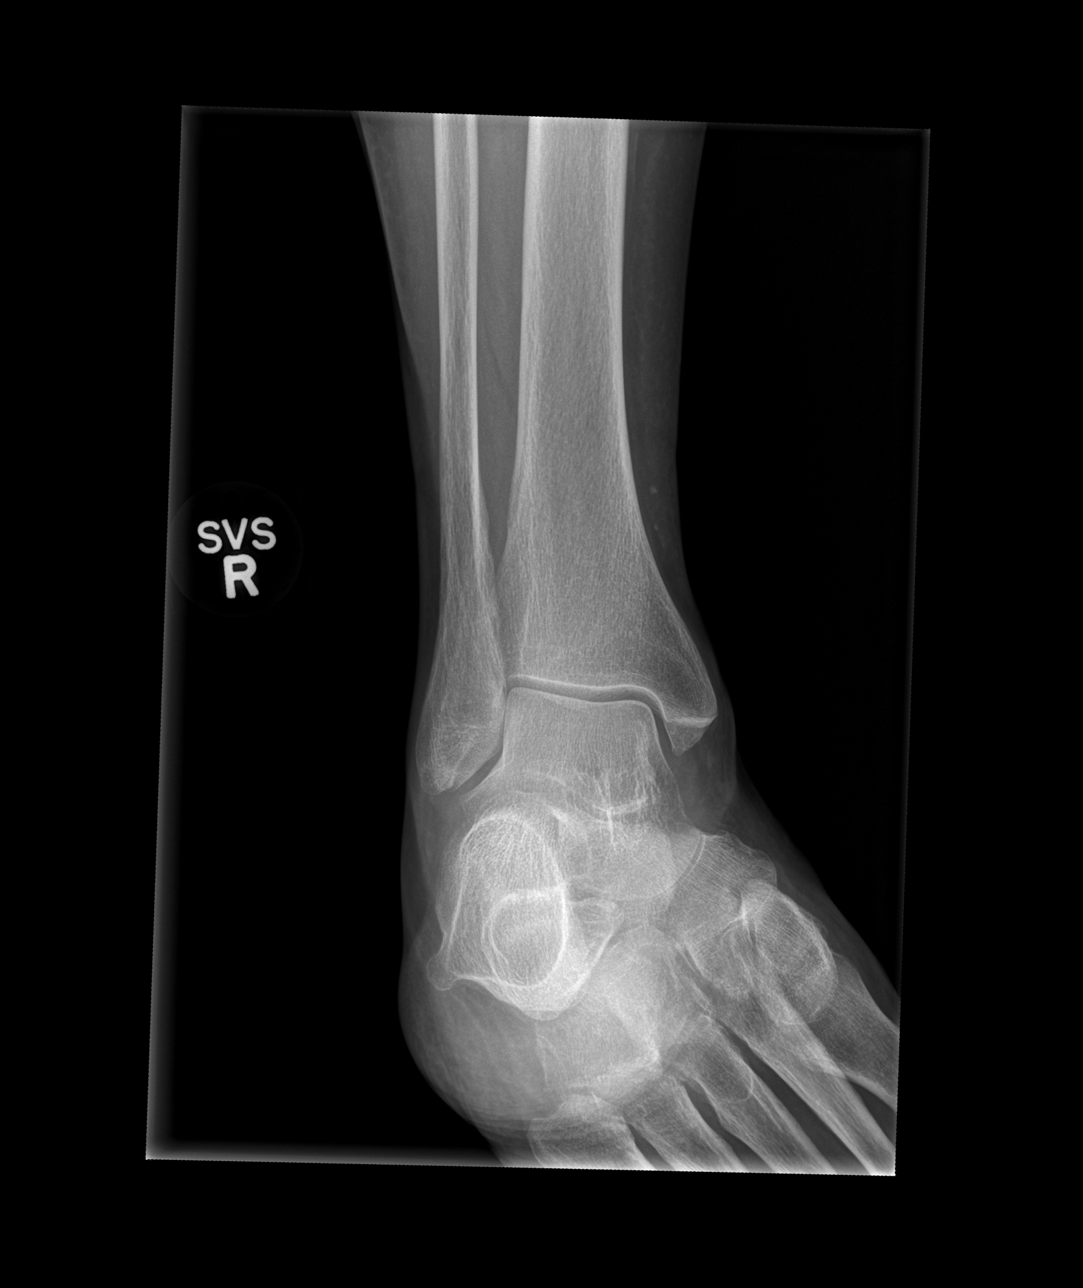

[x ankle lat right (2 of 2)]
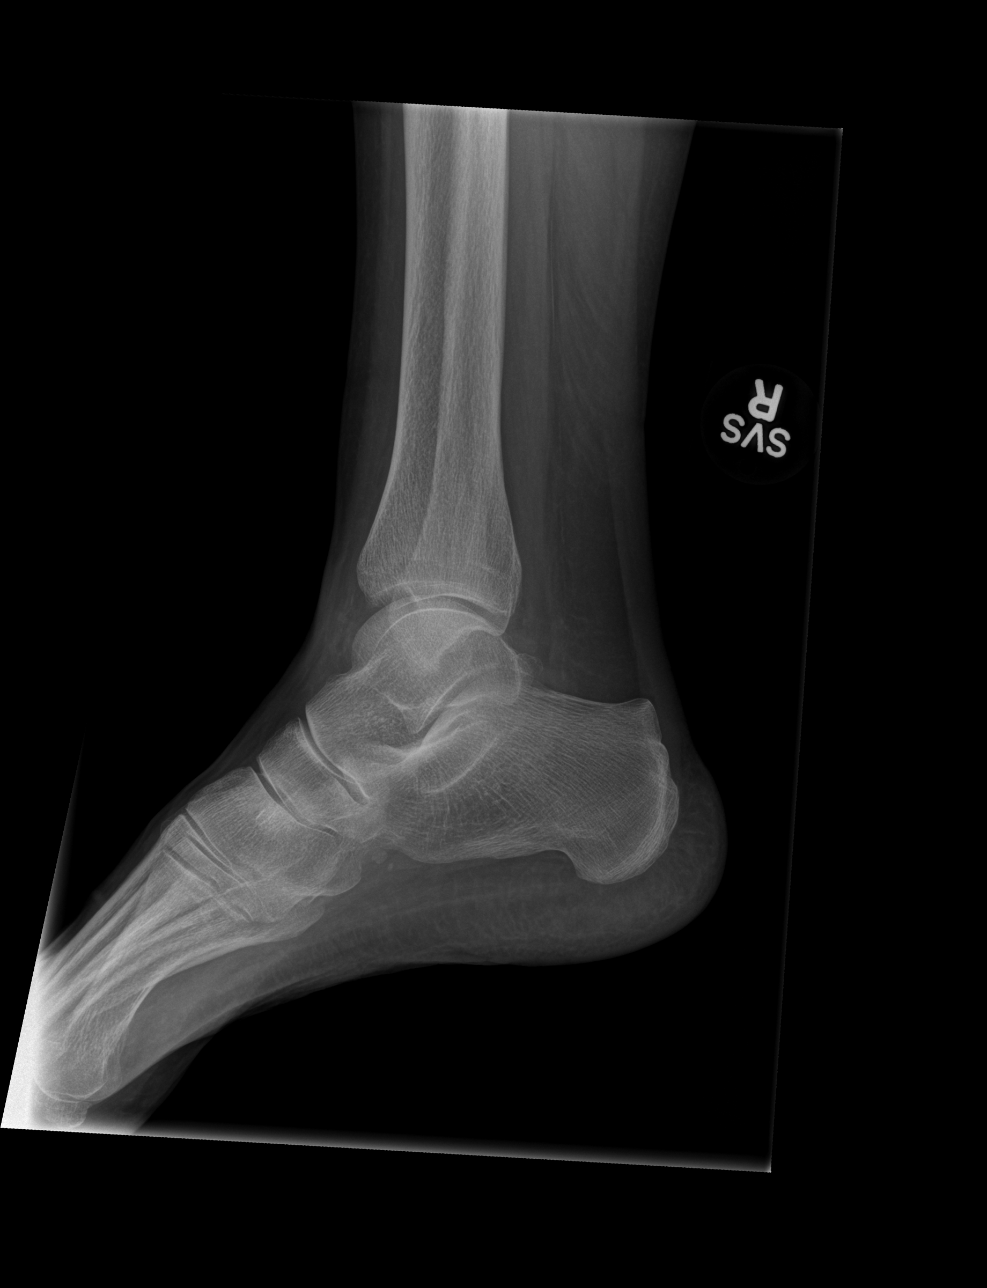

[4 of 4 positions shown; findings below may reference images not displayed]

FINDINGS: Bone mineralization is within normal limits. In each calcaneus
intact. Mortise joint alignment preserved. Talar dome intact. No
definite joint effusion. No acute fracture identified.
IMPRESSION: No acute fracture or dislocation identified about the right ankle.

## 2016-09-26 IMAGING — CR DG FOOT COMPLETE 3+V*R*
3 series · 3 of 3 positions shown · non-contrast
Comparison: None.

CLINICAL DATA: Fall yesterday. Right lateral foot pain. Initial
encounter.

EXAM:
RIGHT FOOT COMPLETE - 3+ VIEW

[x foot ap right]
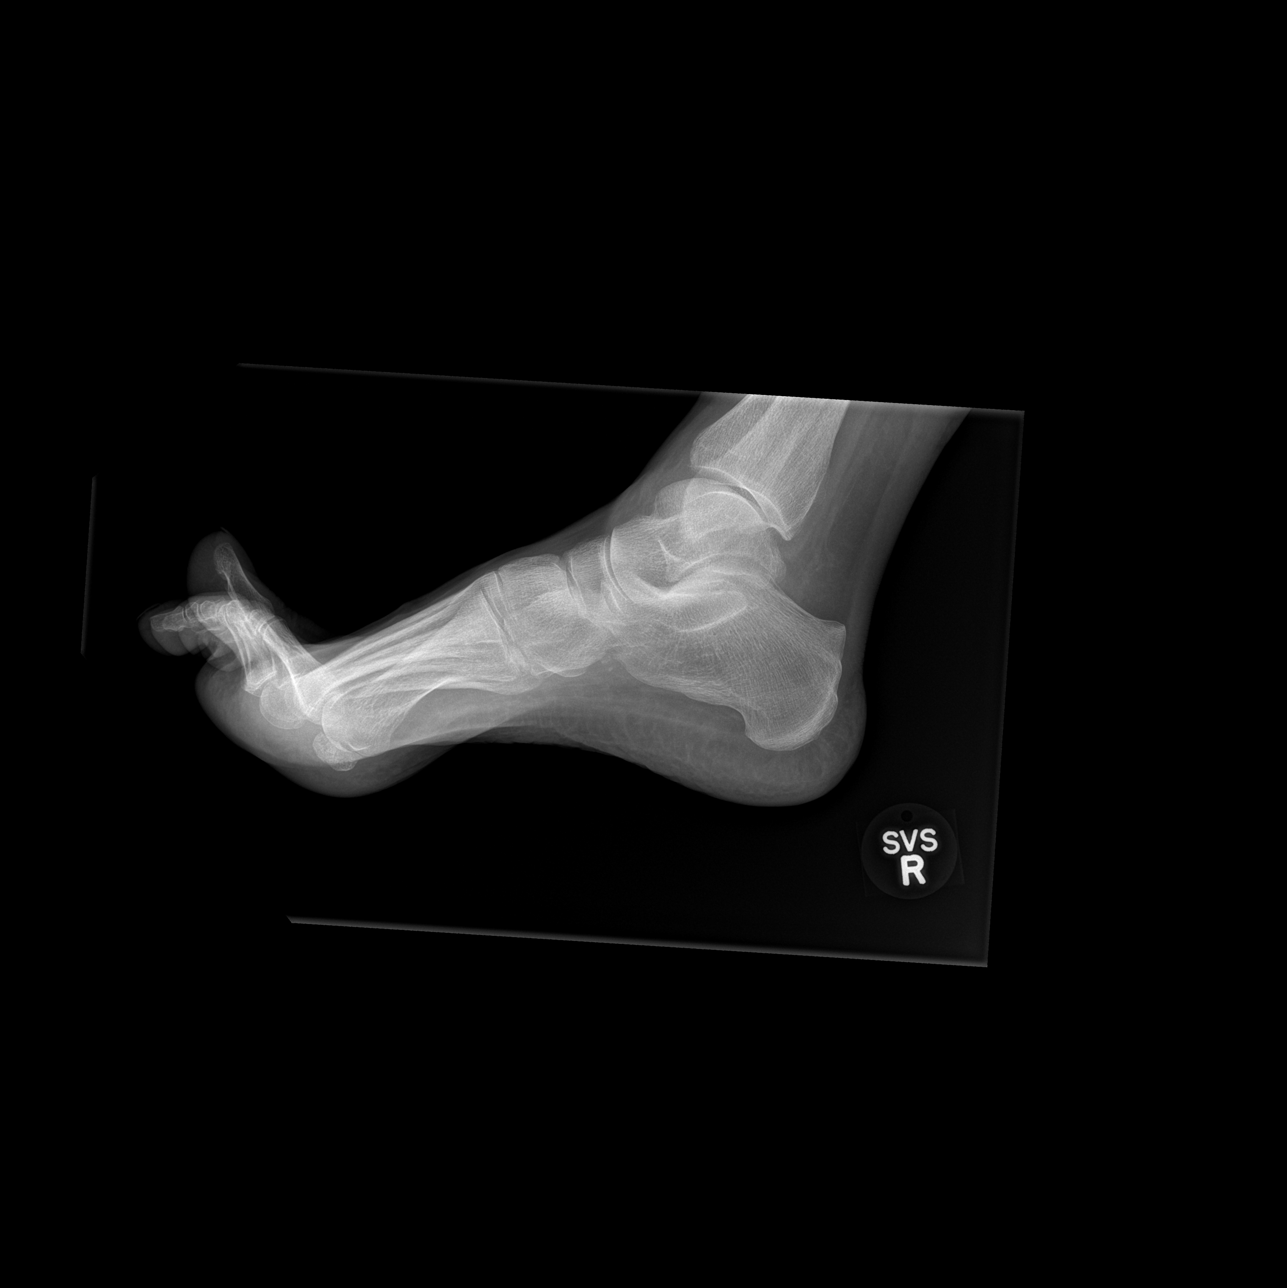

[x foot obl right]
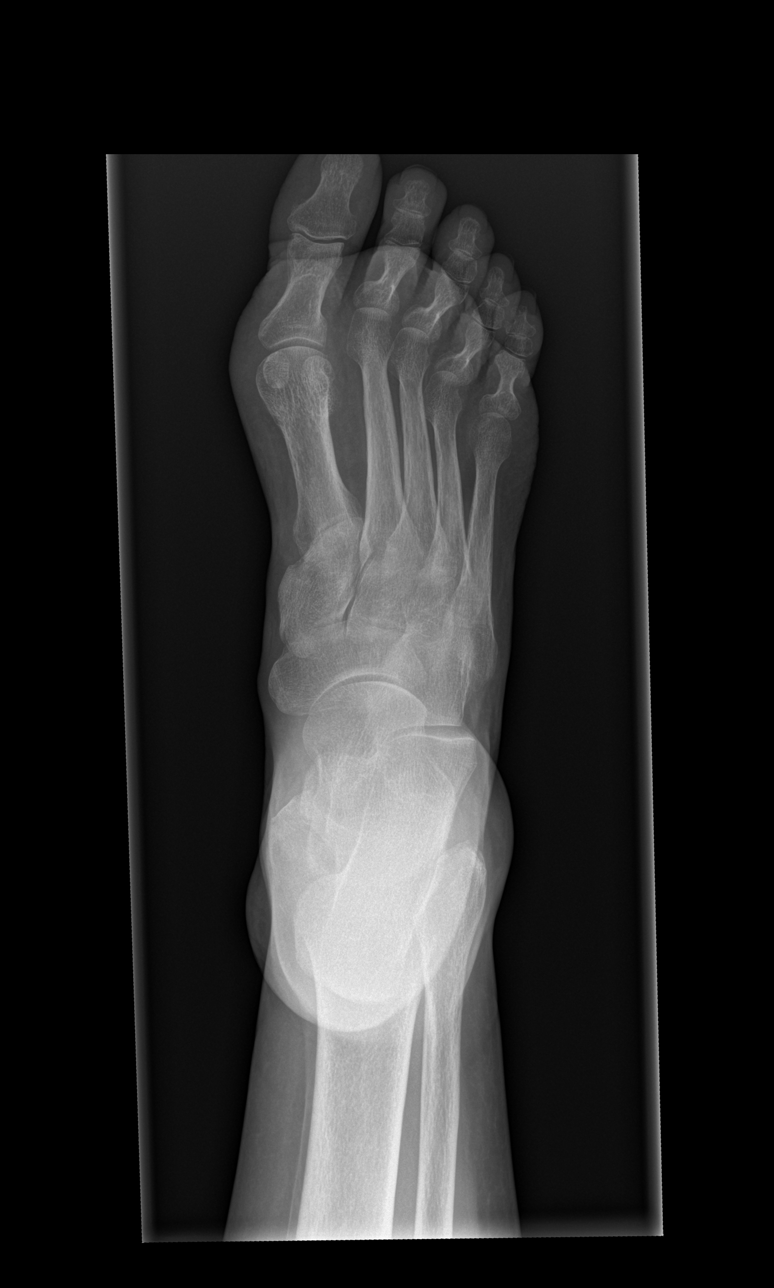

[x foot lat right]
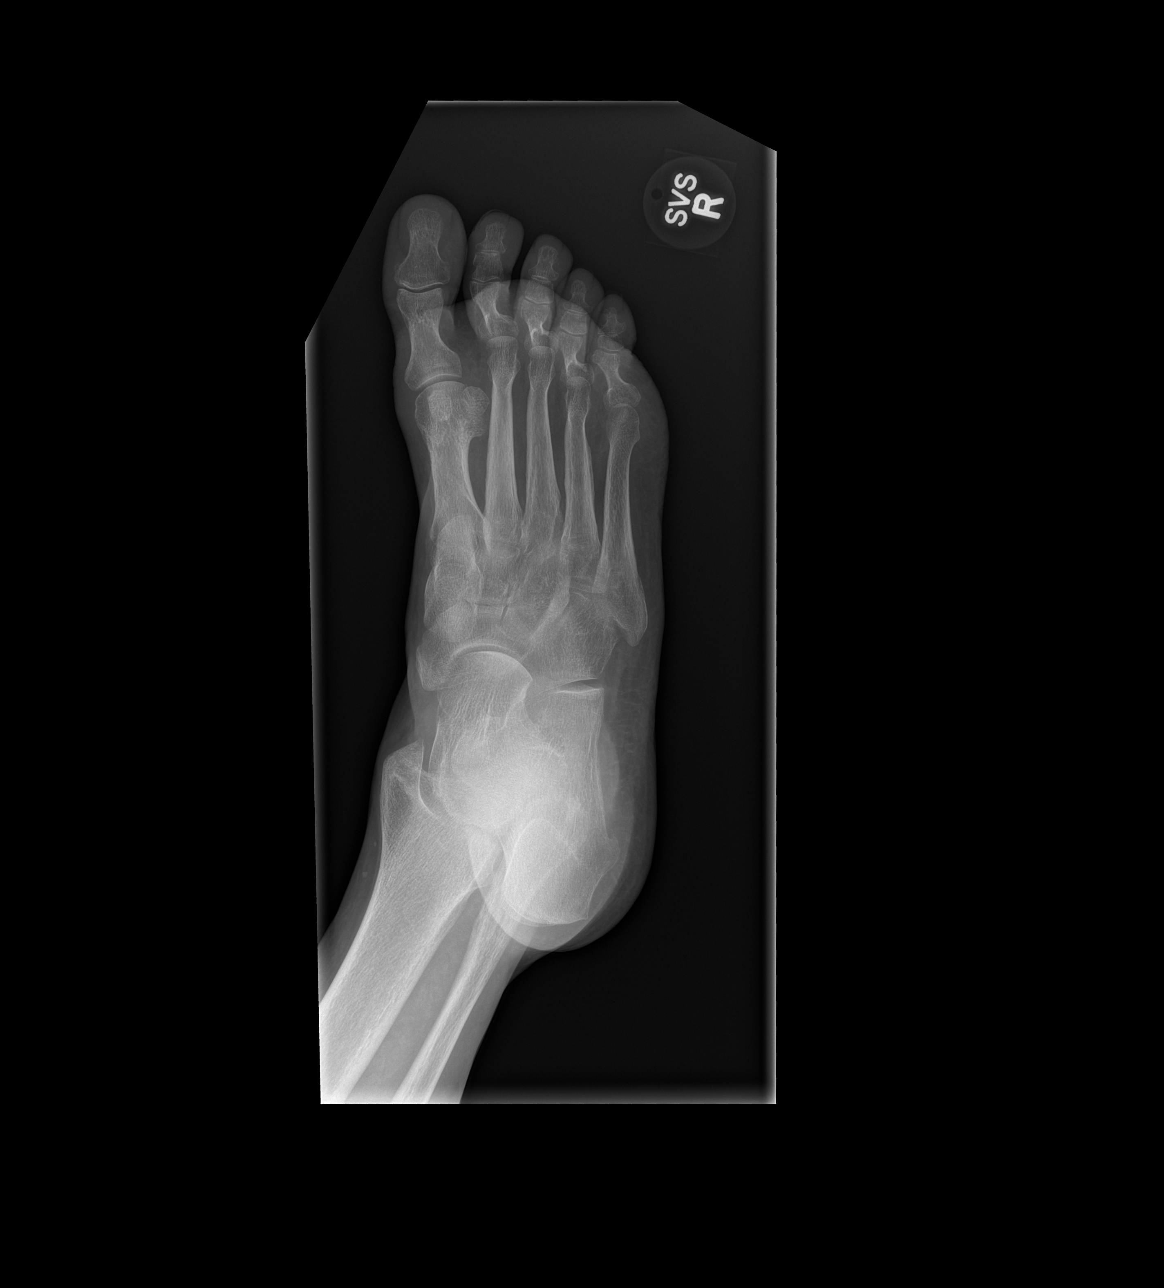

[3 of 3 positions shown; findings below may reference images not displayed]

FINDINGS: There is no evidence of fracture or dislocation. There is no
evidence of arthropathy or other focal bone abnormality. Soft
tissues are unremarkable.
IMPRESSION: Negative.

## 2016-09-26 IMAGING — CR DG KNEE COMPLETE 4+V*R*
4 series · 4 of 4 positions shown · non-contrast
Comparison: None.

CLINICAL DATA: Fall yesterday. Right knee pain. Initial encounter.

EXAM:
RIGHT KNEE - COMPLETE 4+ VIEW

[x knee ap right (1 of 3)]
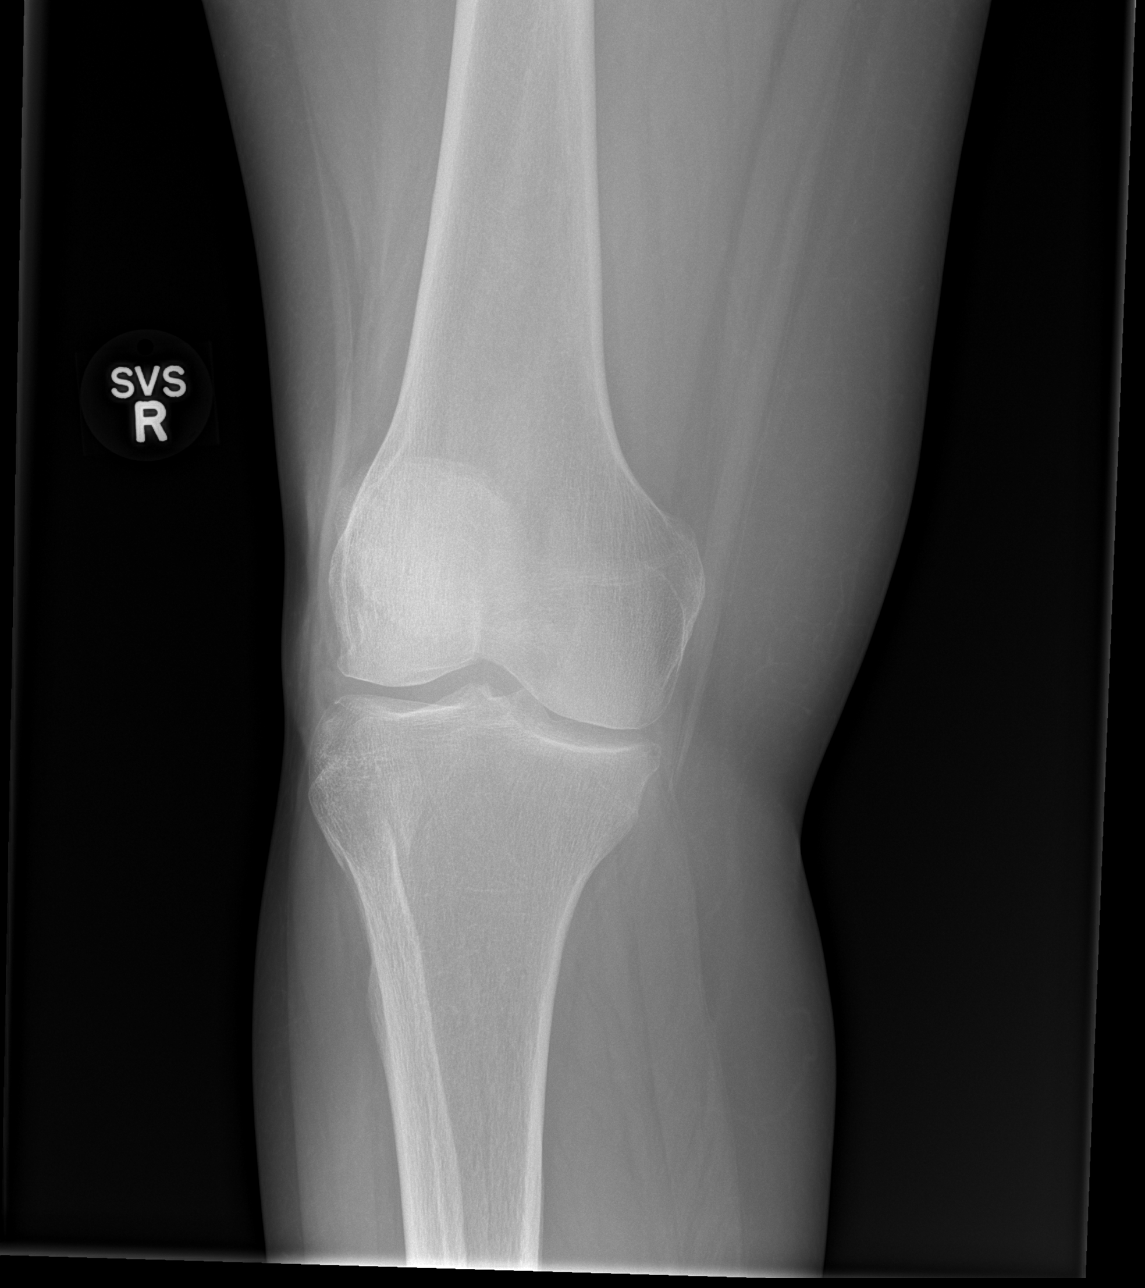

[x knee ap right (2 of 3)]
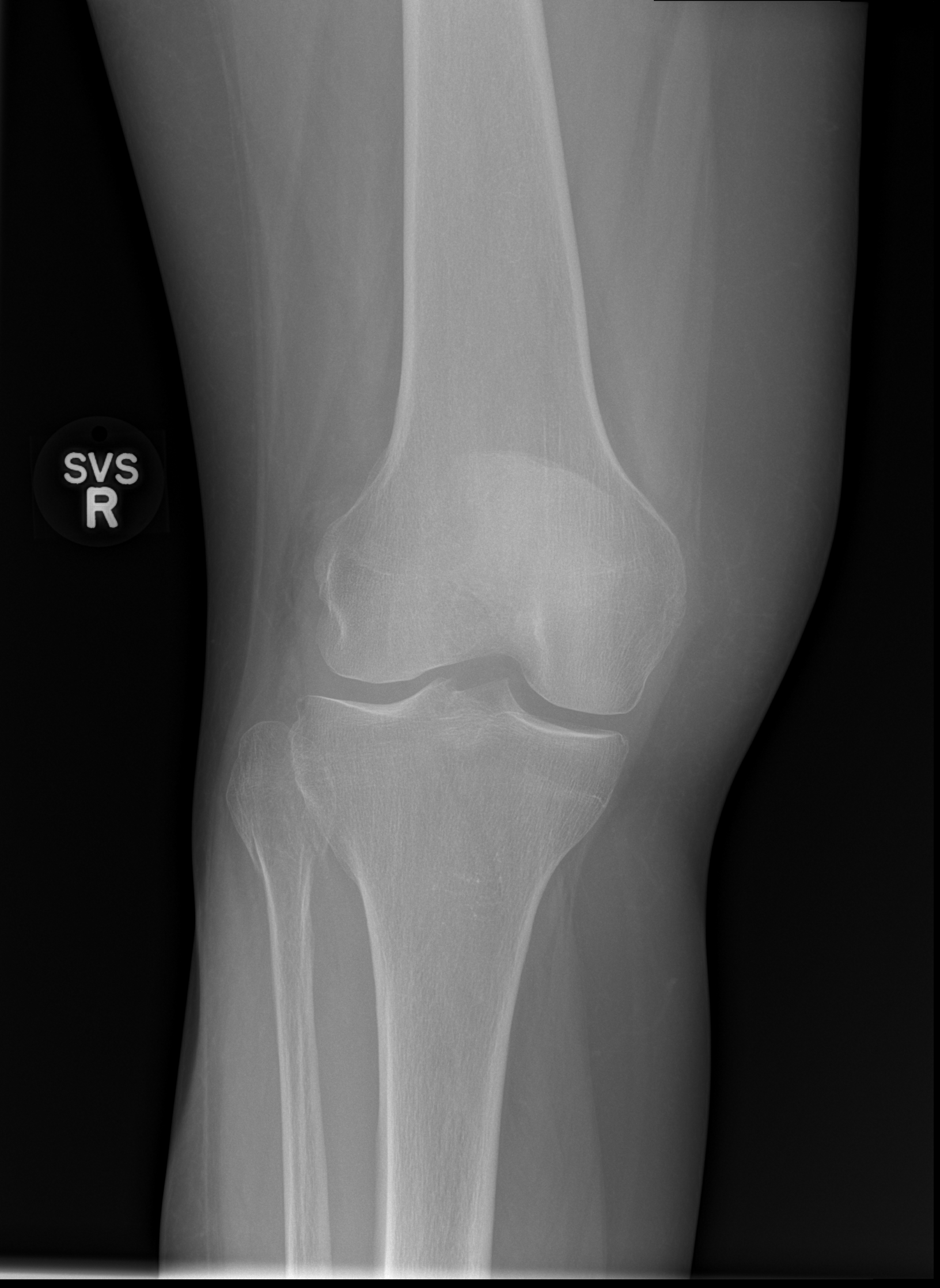

[x knee ap right (3 of 3)]
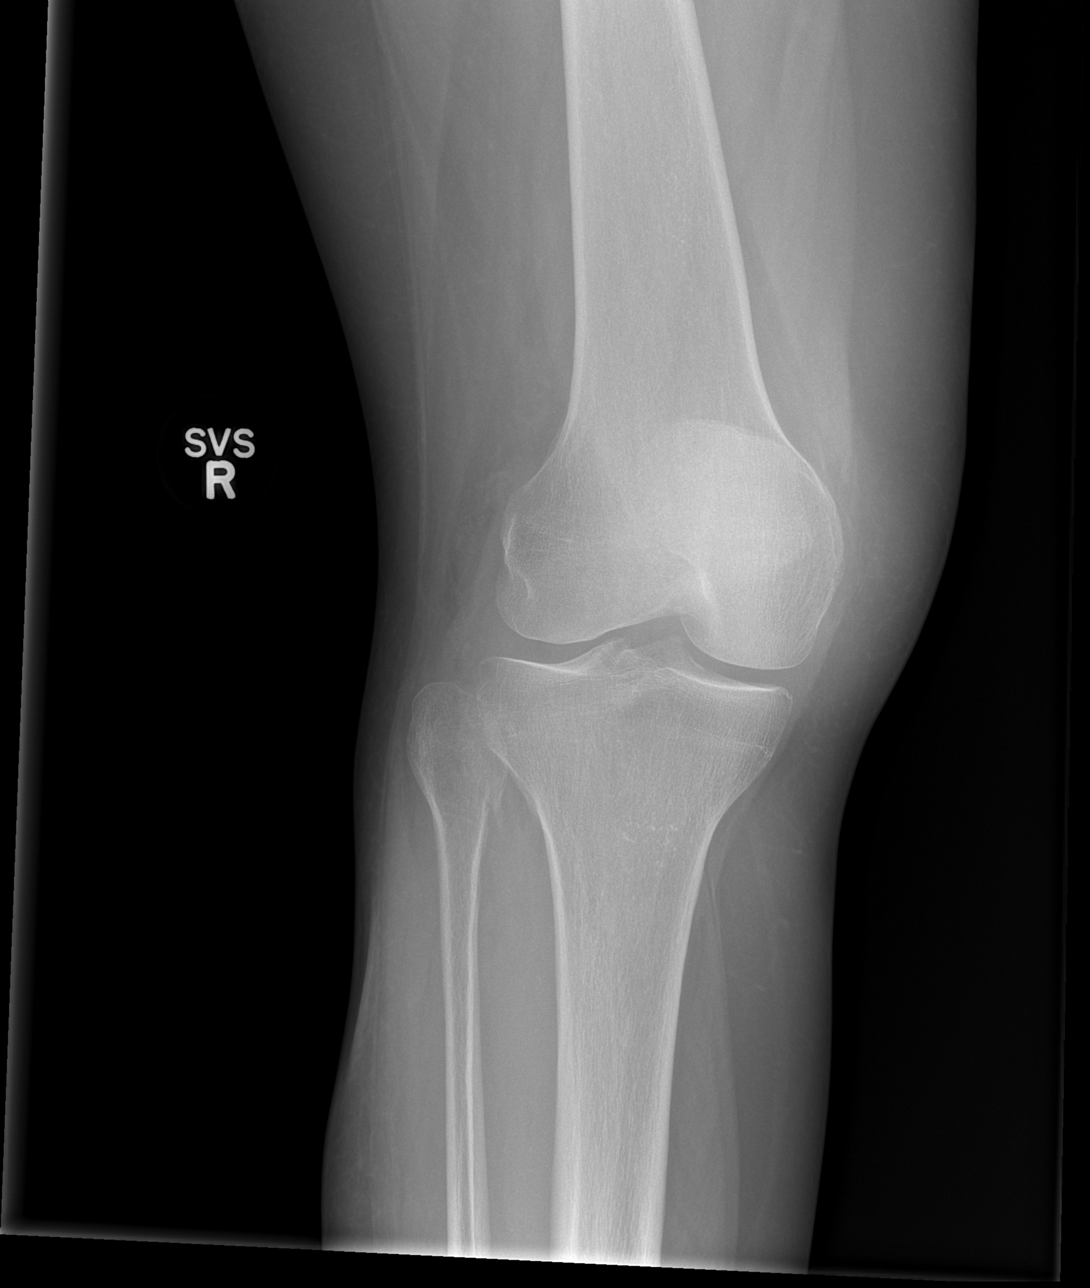

[x knee lat right]
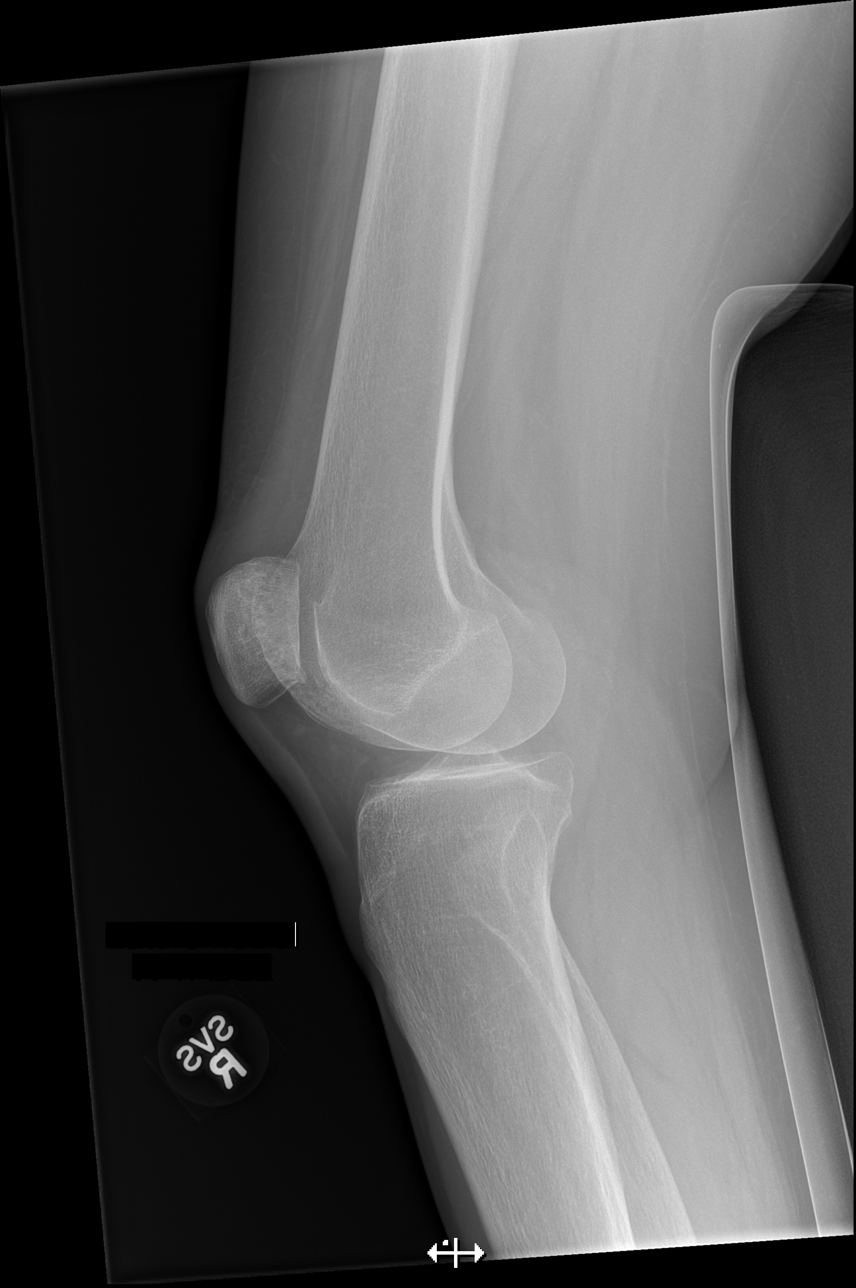

[4 of 4 positions shown; findings below may reference images not displayed]

FINDINGS: Nondisplaced proximal fibular fracture is demonstrated. No other
fractures are identified. No evidence of dislocation. No evidence of
knee joint effusion. Mild degenerative spurring seen involving the
patellofemoral compartment.
IMPRESSION: Nondisplaced proximal fibular fracture.

## 2016-09-27 IMAGING — RF DG ANKLE COMPLETE 3+V*L*
1 series · 3 of 3 positions shown · non-contrast
Comparison: None.

CLINICAL DATA: open reduction internal fixation for left ankle
fracture

EXAM:
DG C-ARM 1-60 MIN - NRPT [facility]; LEFT ANKLE COMPLETE - 3+ VIEW

[Series 1: run · 3 of 3 slices shown]
[im 1/3]
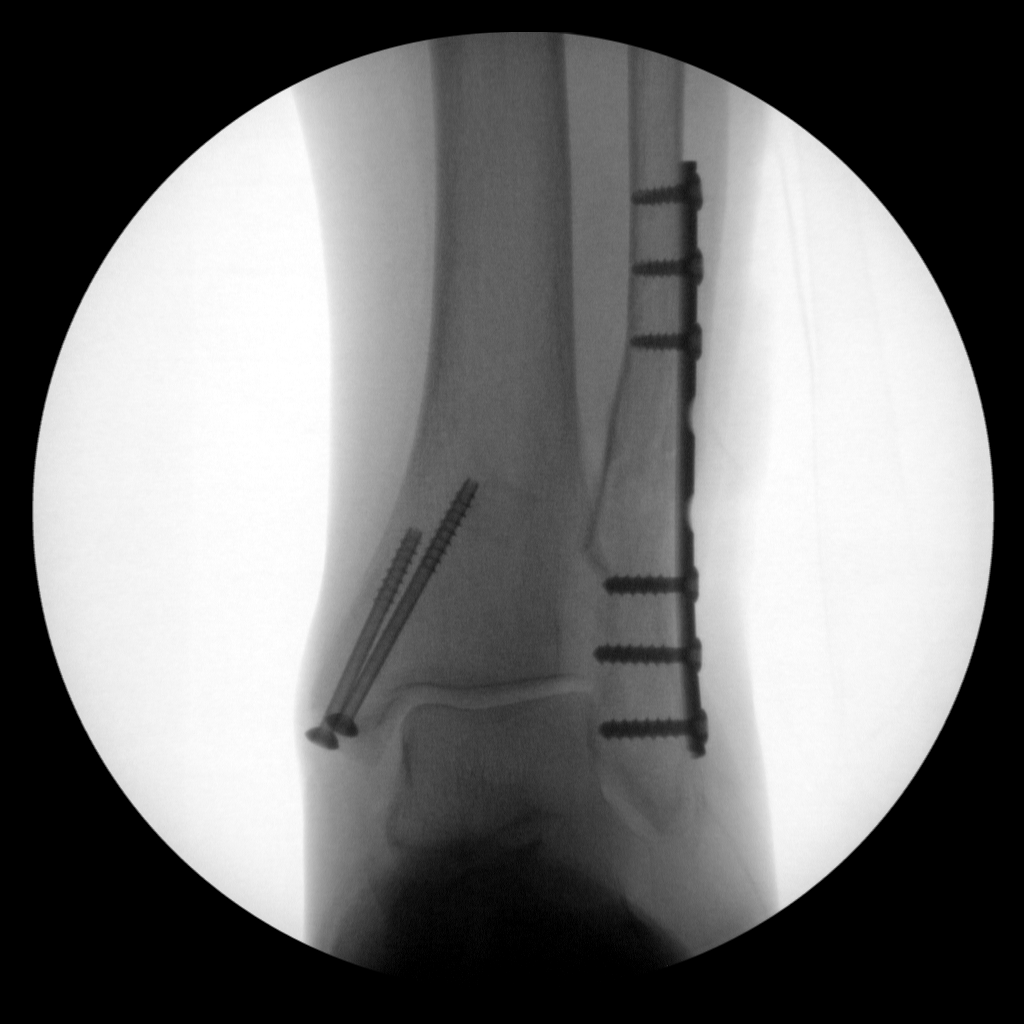
[im 2/3]
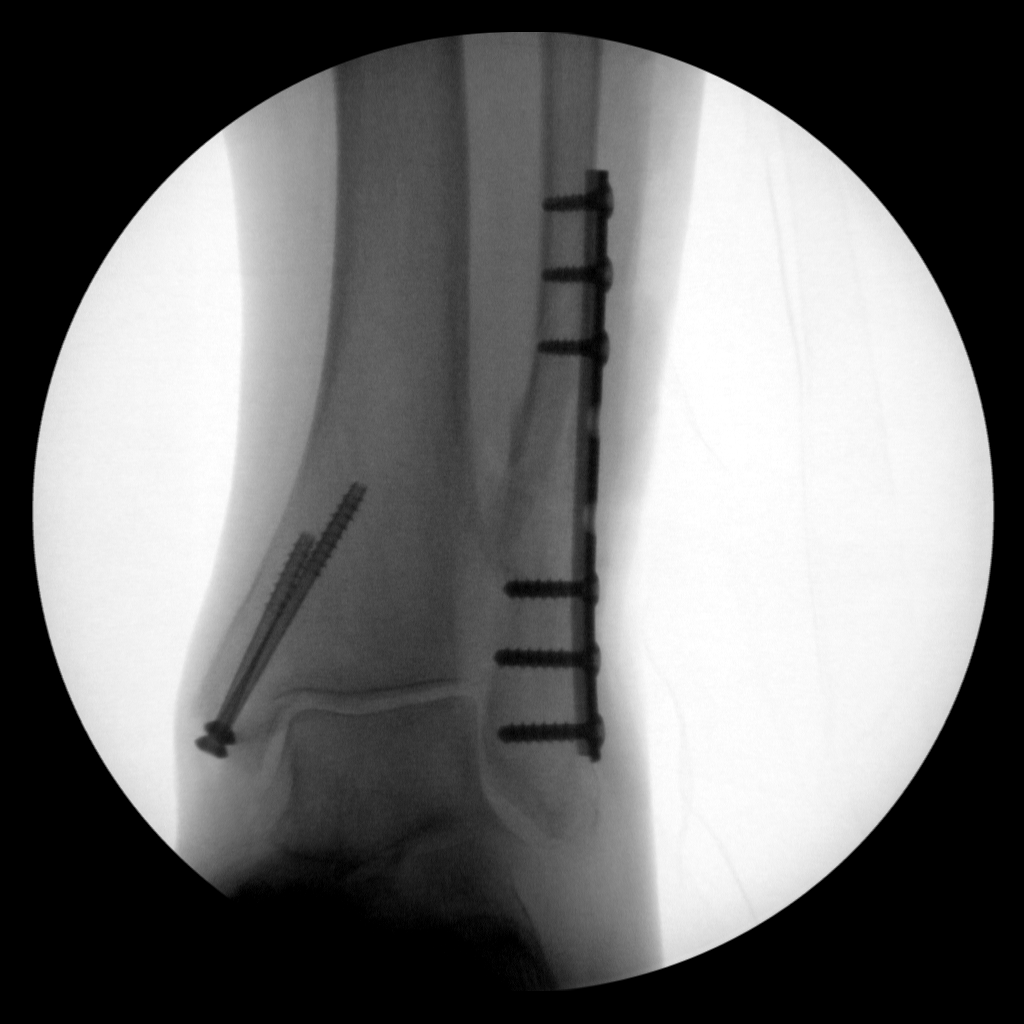
[im 3/3]
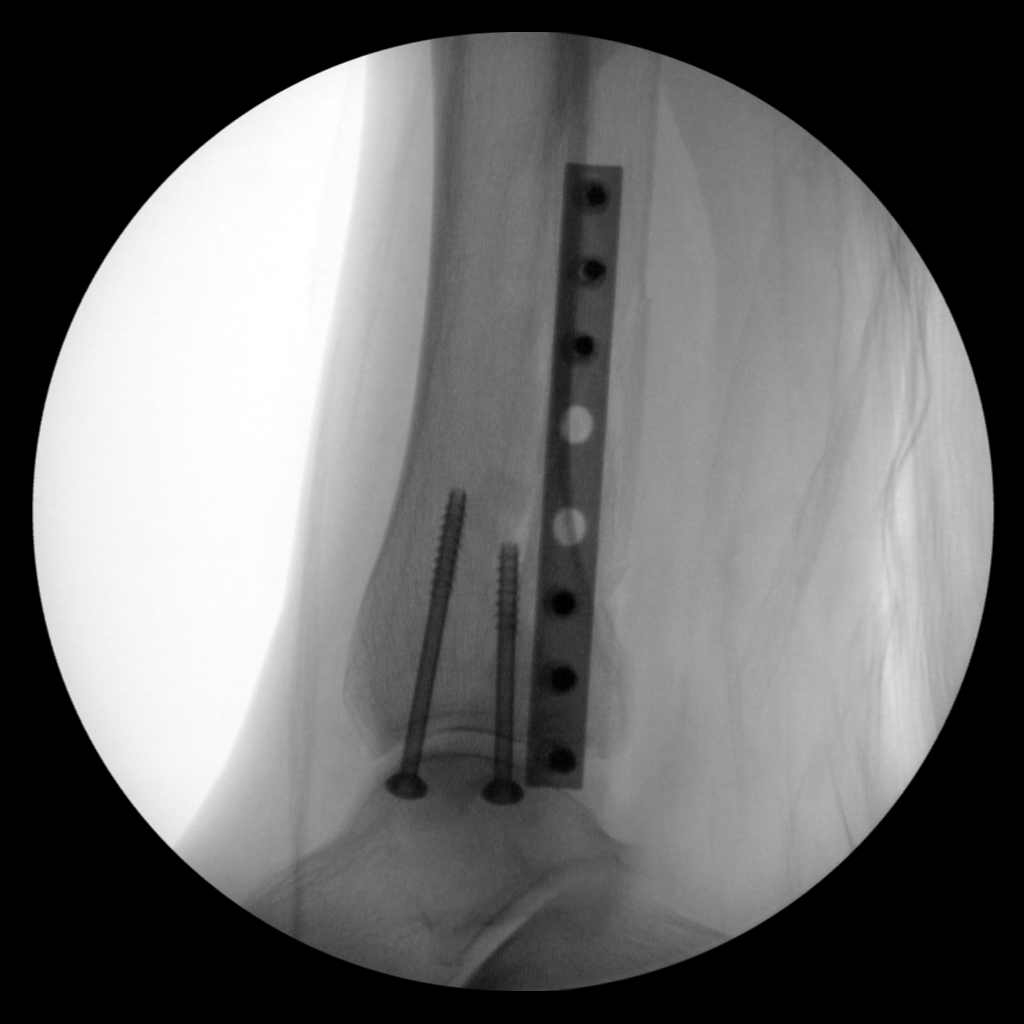

[3 of 3 positions shown; findings below may reference images not displayed]

FLUOROSCOPY TIME:  Fluoroscopy Time:  0 minutes 33 seconds

Number of Acquired Images:  3
FINDINGS: Frontal, oblique, and lateral views were obtained. There is screw
and plate affixed station in the distal fibula with alignment in
this area essentially anatomic. There are 2 screws transfixing the
medial malleolus. Alignment in this area is anatomic. Ankle mortise
appears intact.
IMPRESSION: Screw fixation to the medial malleolus. Screw and plate fixation
distal fibula. Alignment essentially anatomic in these areas. Ankle
mortise intact.

## 2016-09-29 IMAGING — CR DG CHEST 1V PORT
1 series · 1 of 1 positions shown · non-contrast
Comparison: None.

CLINICAL DATA: Shortness of breath, recent lower extremity
fracture. The patient has a history of lung cancer with prior
lobectomy but further details are not available and there is no
prior imaging.

EXAM:
PORTABLE CHEST - 1 VIEW

[AP]
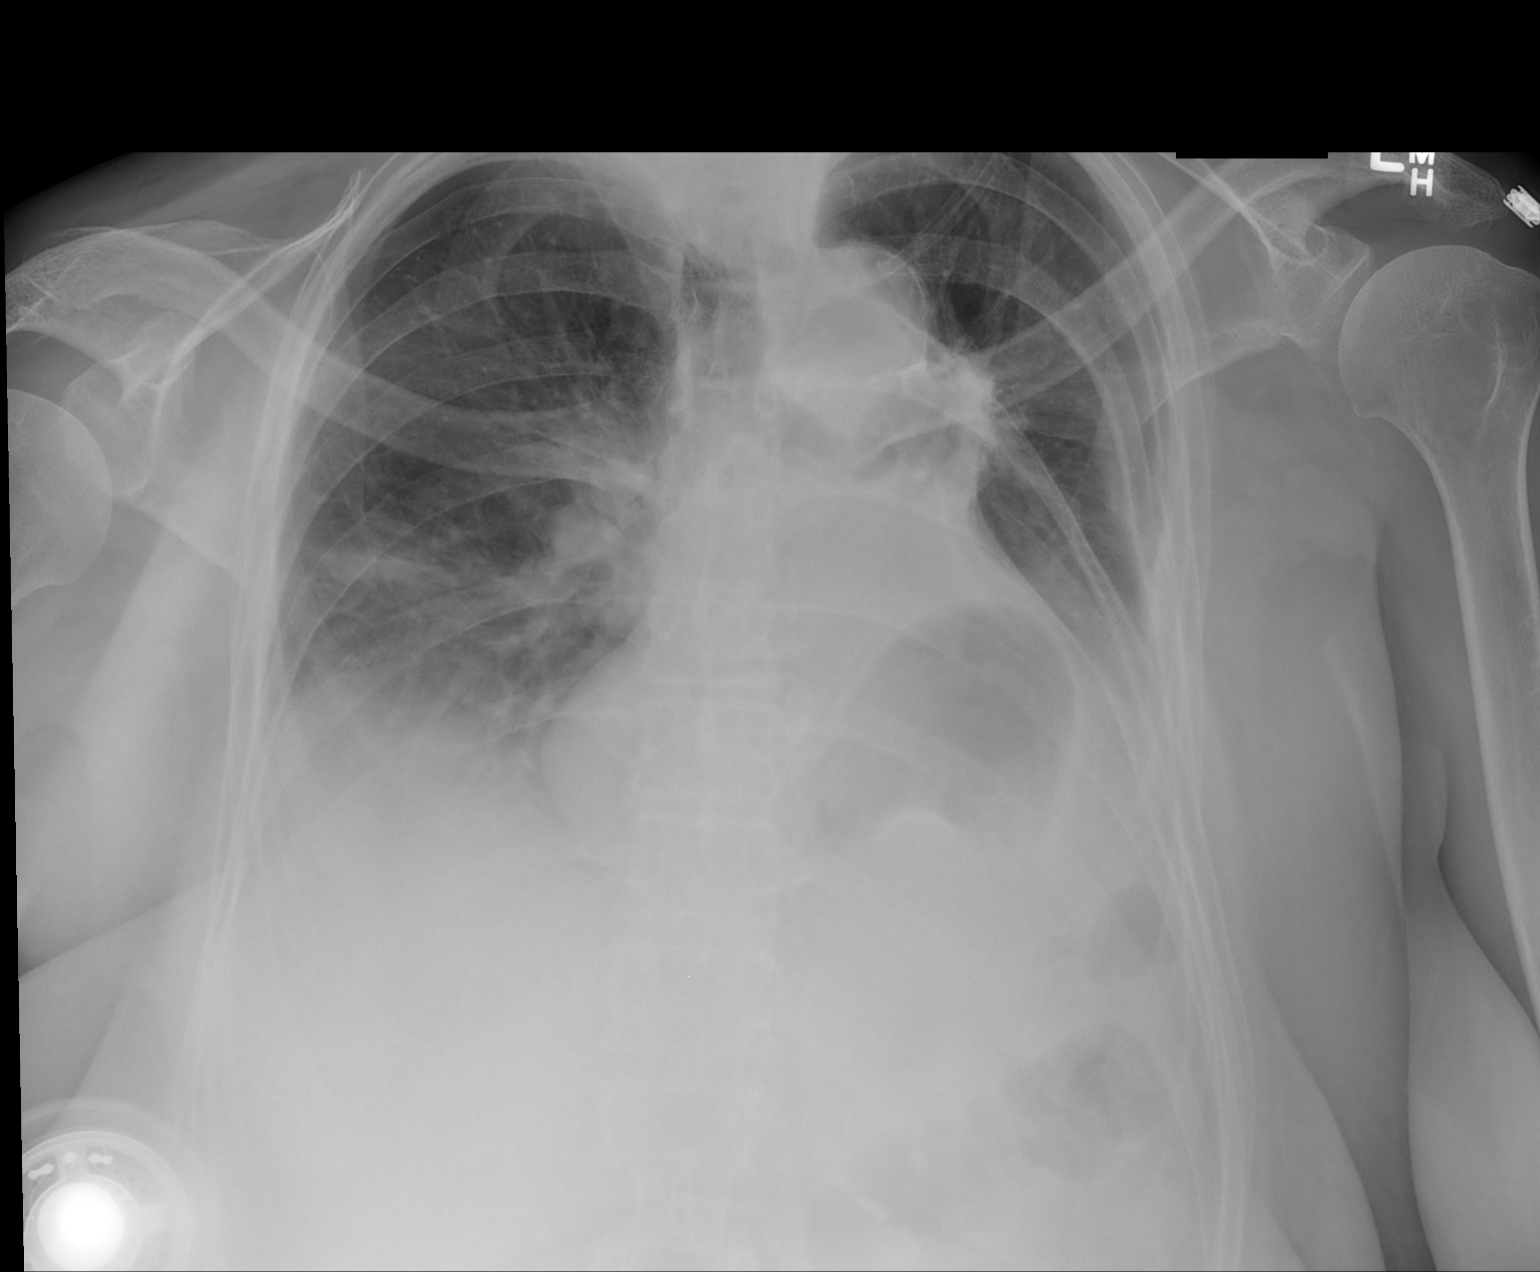

[1 of 1 positions shown; findings below may reference images not displayed]

FINDINGS: Lungs are markedly hypoaerated. The device overlies the right lung
base. Lucency over the heart likely represents hiatal hernia or
elevated left hemidiaphragm. Moderate enlargement of the
cardiomediastinal silhouette is noted. There is any irregular
masslike opacity in the left suprahilar region with left upper hilar
retraction. Moderate pleural effusions are noted obscuring the lung
bases. Curvilinear atelectasis or scarring noted over the right
midlung zone versus fluid tracking in the minor fissure. No acute
osseous finding.
IMPRESSION: Moderate pleural effusions obscuring the lung bases.

Left upward hilar retraction with masslike opacity in the left
suprahilar region. Although this may represent fibrosis, amass could
have a similar appearance. Posttreatment change after left upper
lobectomy could appear similar. Comparison with presumed outside
previous imaging would be helpful for comparison. If symptoms
persist, consider PA and lateral chest radiographs obtained at full
inspiration when the patient is clinically able.

## 2016-10-15 IMAGING — MR MR LUMBAR SPINE W/O CM
4 of 5 series · 17 of 48 positions shown · non-contrast
Comparison: CT of the abdomen and pelvis October 20, 2014 at 7430
hours and CT of the abdomen and pelvis January 20, 2014

CLINICAL DATA: Fell in [employer] parking [place] October 08, 2014
injuring lower back. History of L3 and L4 fracture. History of
metastatic lung cancer. Recent bowel impaction, urinary bladder
retention, lower extremity weakness, and clinical concern for
neurogenic bladder.

EXAM:
MRI LUMBAR SPINE WITHOUT CONTRAST
TECHNIQUE: Multiplanar, multisequence MR imaging of the lumbar spine was
performed. No intravenous contrast was administered.

[Series 4: T2 post-contrast · sagittal · 5.0mm · 0.55mm/px · 6 of 14 slices shown]
[im 1/14]
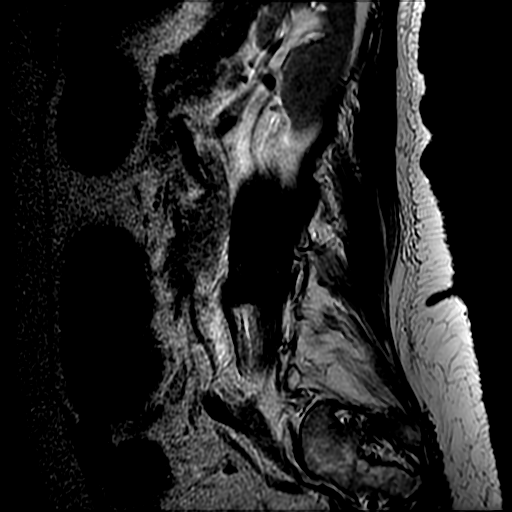
[im 3/14]
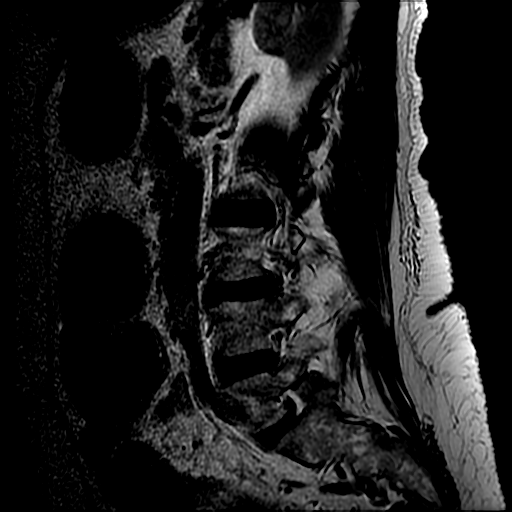
[im 6/14]
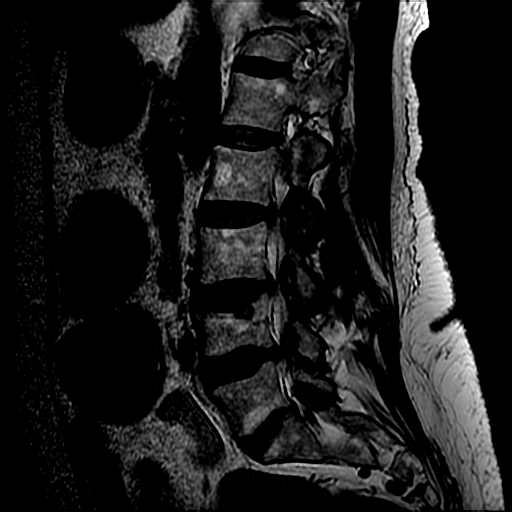
[im 8/14]
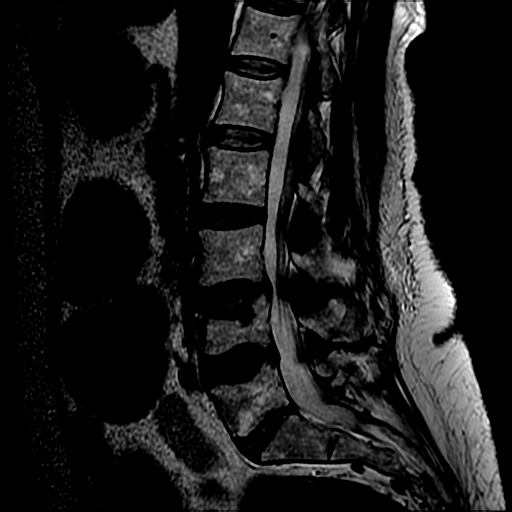
[im 11/14]
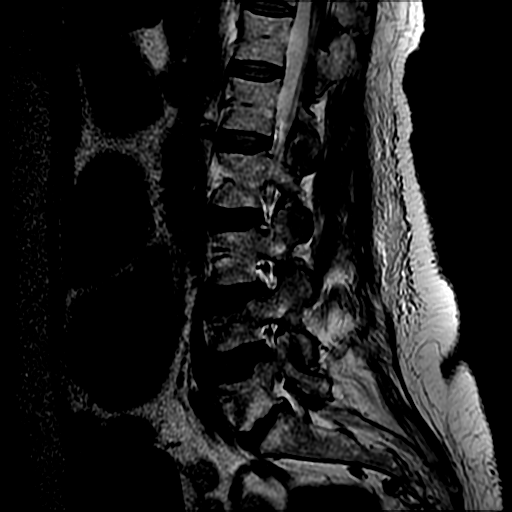
[im 14/14]
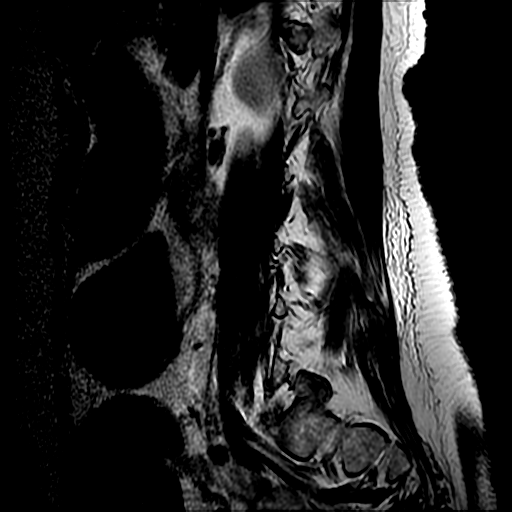

[Series 5: T1 · sagittal · 5.0mm · 0.55mm/px · 3 of 14 slices shown (1 of 2)]
[im 1/14]
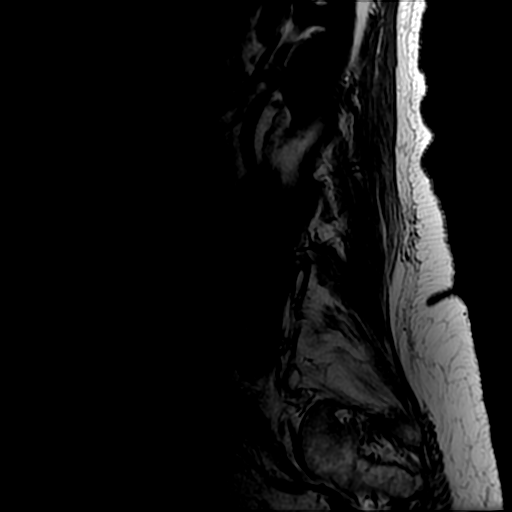
[im 7/14]
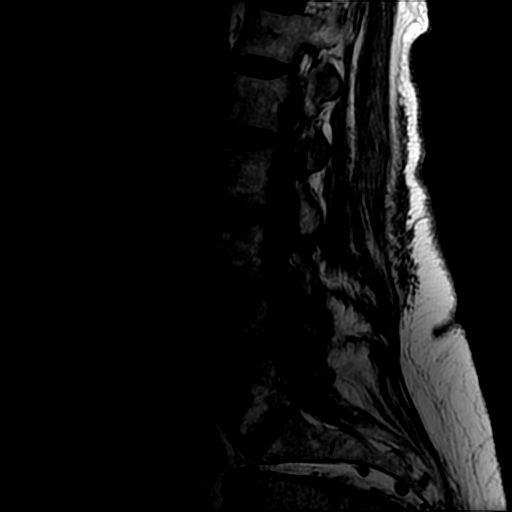
[im 14/14]
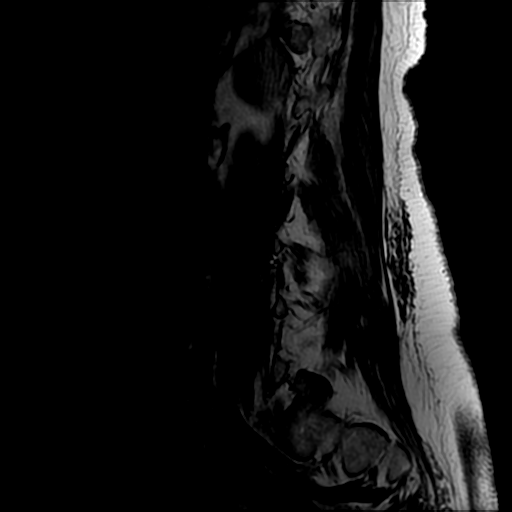

[Series 7: T2 · axial · 5.0mm · 0.39mm/px · z∈[-32,+174]mm · 5 of 42 slices shown]
[im 3/42]
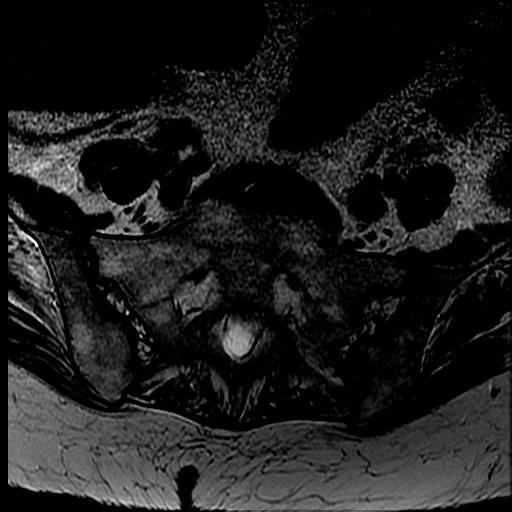
[im 6/42]
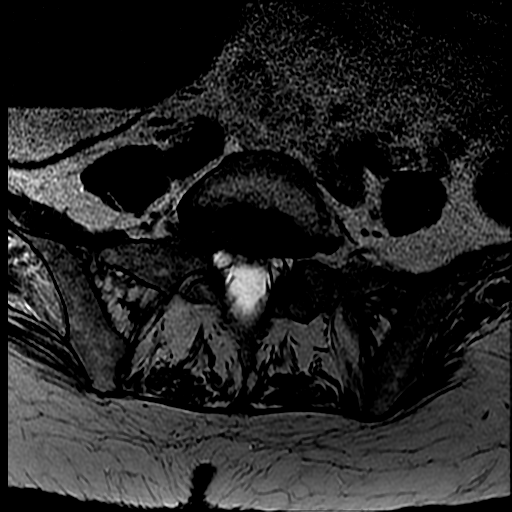
[im 9/42]
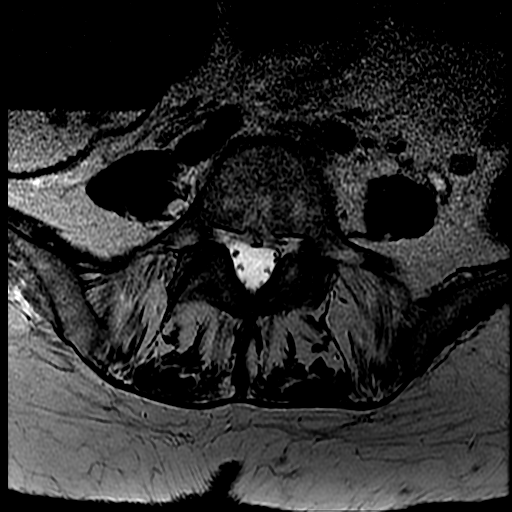
[im 22/42]
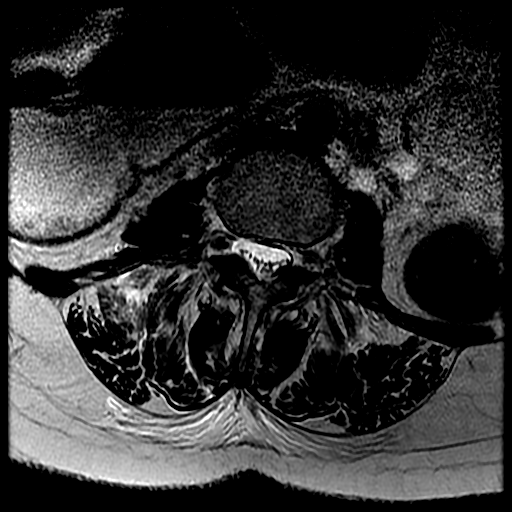
[im 36/42]
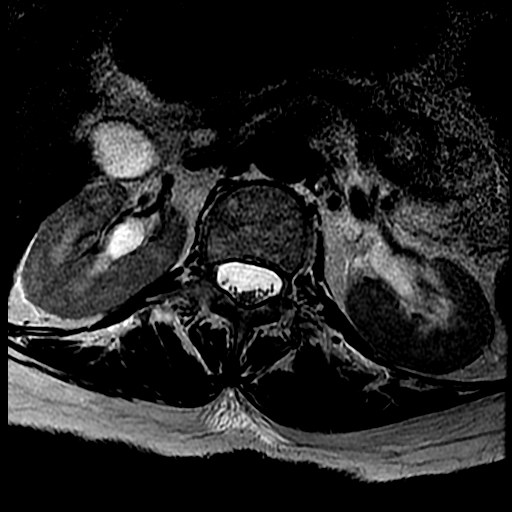

[Series 8: T1 · axial · 5.0mm · 0.39mm/px · z∈[-18,+174]mm · 3 of 42 slices shown (2 of 2)]
[im 6/42]
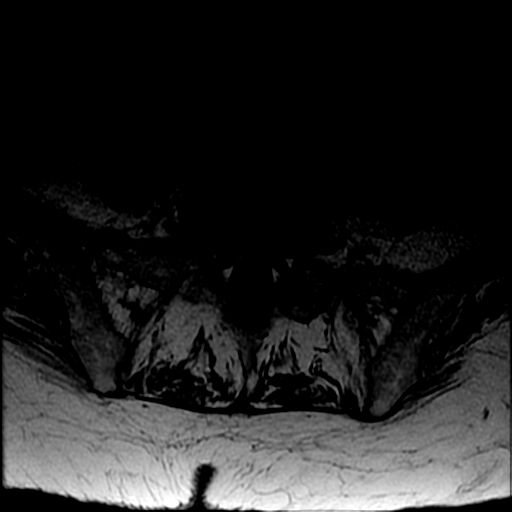
[im 22/42]
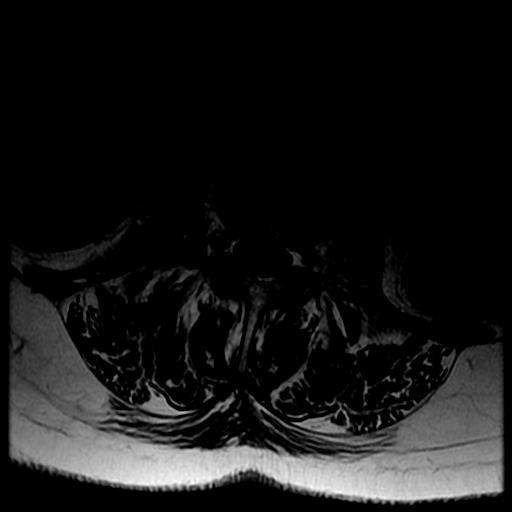
[im 36/42]
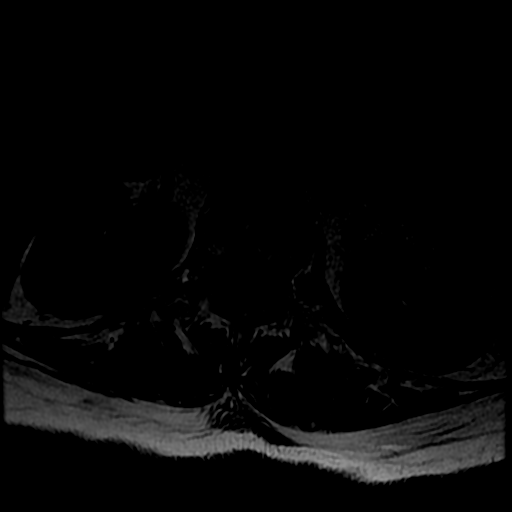

[17 of 48 positions shown; findings below may reference images not displayed]

FINDINGS: Using the reference level of the last well-formed intervertebral
disc as L5-S1, moderate L4 vertebral body compression fracture with
approximately 50% height loss, mild to moderate L5 compression
fracture with approximately 20% height loss. These are new from January 2014 though, no STIR signal abnormality to suggest acute fracture.
No malalignment. Remaining lumbar vertebral bodies intact. Moderate
to severe L5-S1 disc height loss, remaining disc morphology is
maintained, decreased T2 signal within the L2-3 through L5-S1 disc
consistent with mild desiccation. Mild chronic discogenic endplate
changes L3-4 and L4-5, mild subacute to chronic discogenic endplate
change at L5-S1.

Conus medullaris terminates at T12-L1 and appears normal in
morphology and signal characteristics, partially imaged. Cauda
equina is unremarkable. Moderate to severe paraspinal muscle
atrophy, with bright interstitial STIR signal suggesting a component
denervation. Large amount of stool in the large bowel partially
imaged, with mild hydronephrosis bilaterally, incompletely assessed.

Level by level evaluation:

T12-L1, L1-2: No disc bulge, canal stenosis nor neural foraminal
narrowing.

L2-3: Small LEFT extra foraminal disc protrusion encroaches upon the
exited LEFT L2 nerve. Mild facet arthropathy and ligamentum flavum
redundancy. Mild canal stenosis. Mild LEFT neural foraminal
narrowing. Tiny LEFT facet synovial cyst extending into the
paraspinal soft tissues.

L3-4: 3 mm broad-based disc bulge, moderate facet arthropathy and
ligamentum flavum redundancy with trace facet effusions which are
likely reactive. Moderate to severe canal stenosis and trefoil
configuration with partial effacement lateral recesses which may
affect the traversing L4 nerve. Mild LEFT neural foraminal
narrowing. Bilateral LEFT greater than RIGHT subcentimeter synovial
facet cysts extend into the paraspinal soft tissues.

L4-5: 3 mm broad-based disc bulge, central annular tear. Mild facet
arthropathy and ligamentum flavum redundancy. Mild canal stenosis.
Mild bilateral neural foraminal narrowing.

L5-S1: 2 mm annular bulging, mild facet arthropathy and ligamentum
flavum redundancy without canal stenosis. Mild RIGHT, mild to
moderate LEFT neural foraminal narrowing.
IMPRESSION: Remote moderate L4 and mild to moderate L5 compression fractures. No
acute fracture or malalignment.

Degenerative change of the lumbar spine resulting in moderate to
severe canal stenosis at L3-4, mild at L2-3 and L3-4.

Neural foraminal narrowing L2-3 through L5-S1: Mild to moderate on
the LEFT at L5-S1.

Moderate to severe paraspinal muscle atrophy with a component of
suspected denervation.

  By: Vaheed Tiger

## 2016-10-15 IMAGING — CR DG ABDOMEN ACUTE W/ 1V CHEST
6 series · 6 of 6 positions shown · non-contrast
Comparison: CT abdomen 01/20/2014.

CLINICAL DATA: Soft tissue mass in the lower abdomen on physical
exam. Initial encounter.

EXAM:
ACUTE ABDOMEN SERIES (ABDOMEN 2 VIEW & CHEST 1 VIEW)

[w abdomen decub (1 of 2)]
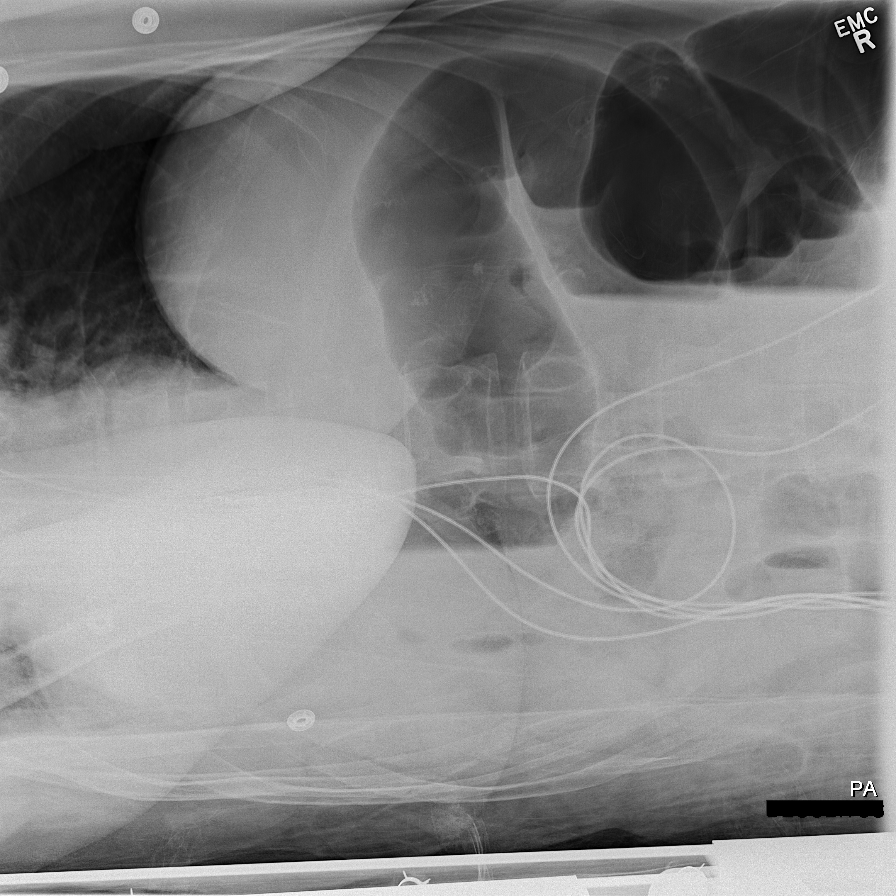

[w abdomen decub (2 of 2)]
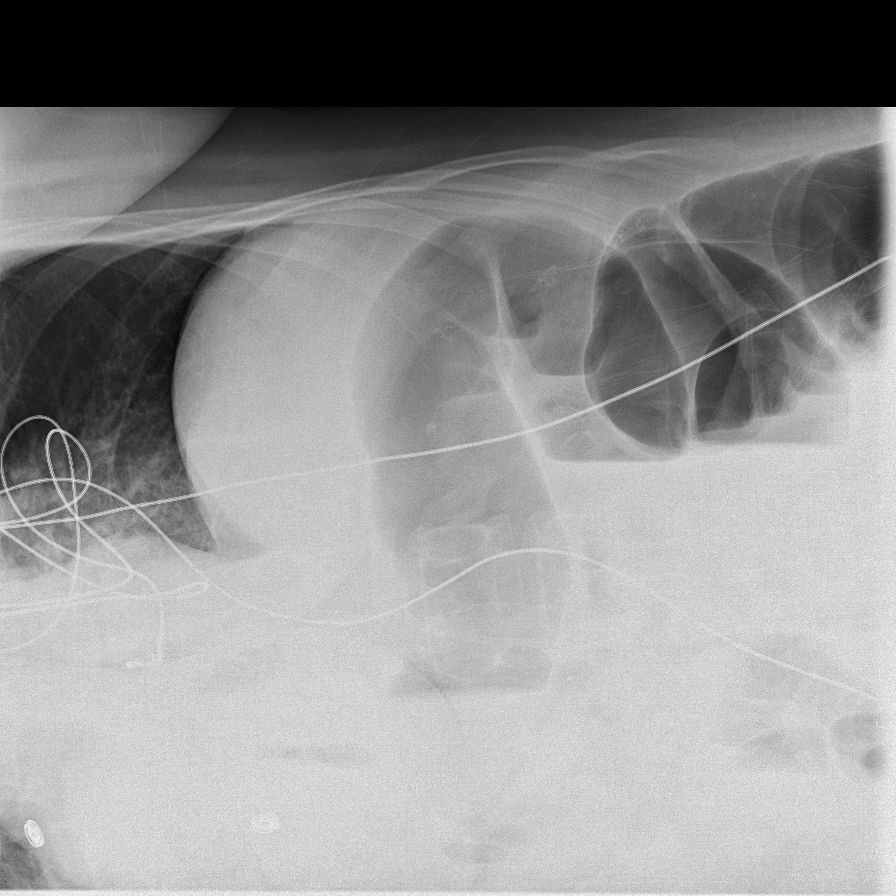

[x abdomen supine (1 of 2)]
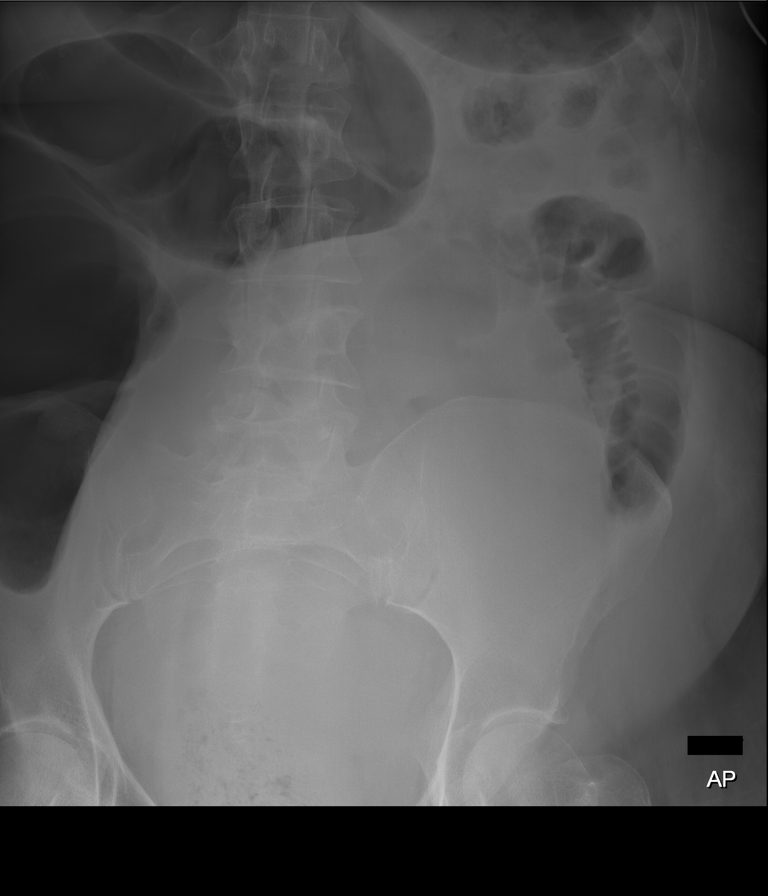

[x abdomen supine (2 of 2)]
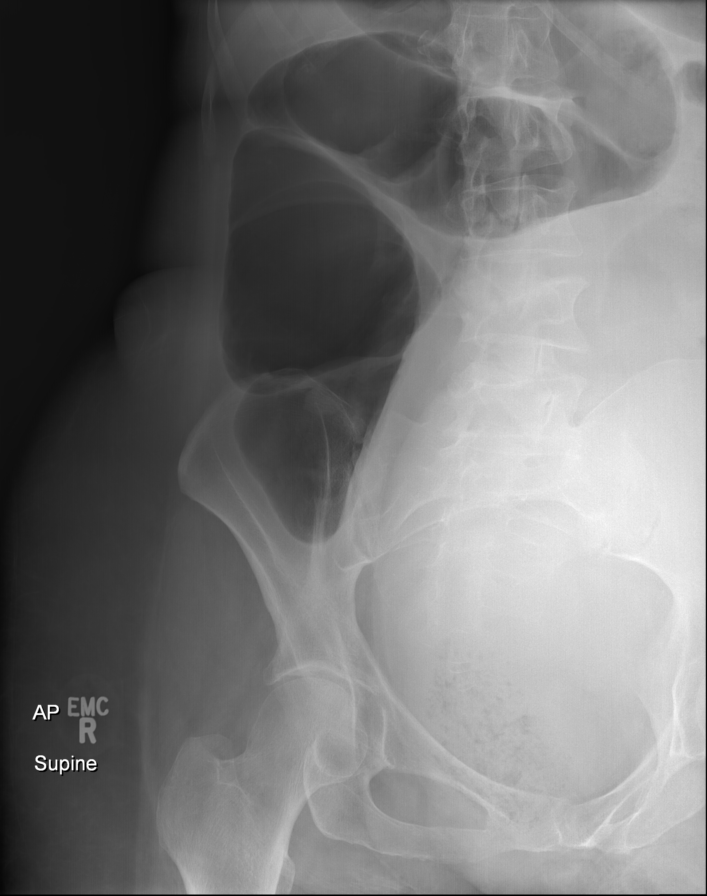

[x chest ap (1 of 2)]
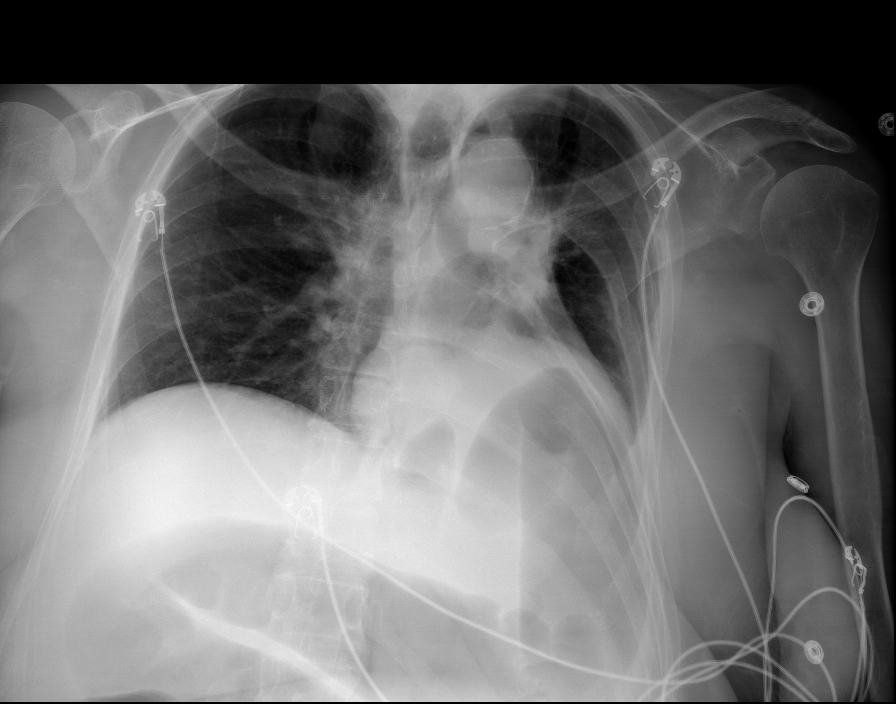

[x chest ap (2 of 2)]
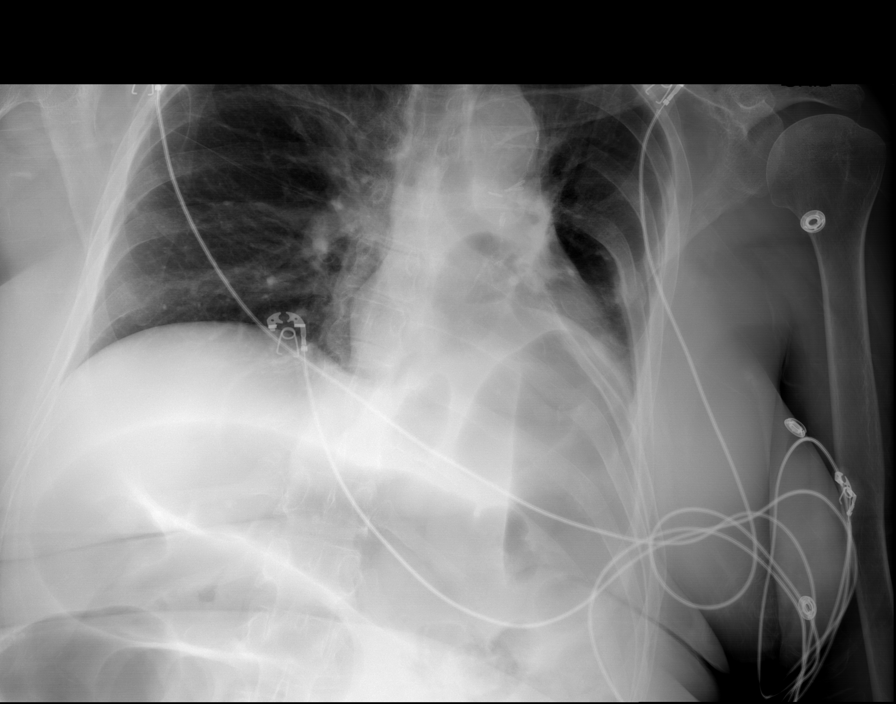

[6 of 6 positions shown; findings below may reference images not displayed]

FINDINGS: There is no out acute cardiopulmonary disease. Marked gaseous
distension of bowel is present with elevation of the LEFT
hemidiaphragm. Although elevation of the LEFT hemidiaphragm has been
a chronic finding, this is more pronounced on today's exam. Lung
volumes are also lower than on previous studies. Monitoring leads
project over the chest. Postoperative changes are present in the
LEFT upper lobe and some of this accounts for volume loss and
elevation of the LEFT hemidiaphragm.

There is no free air under the hemidiaphragms on semi upright
imaging. There is gaseous distension of: , with multiple air-fluid
levels. Decubitus images were obtained and there is no
intra-abdominal free air. Stool is present in the rectosigmoid. Mild
dilation of small bowel loops measuring up to 3.3 cm. The cecum
measures 10.7 cm and is distended with gas. Cecal position appears
within normal limits.
IMPRESSION: 1. Postsurgical changes in the LEFT upper lobe with elevation of the
LEFT hemidiaphragm.
2. Small and large bowel dilation with marked gaseous distension and
air-fluid levels. The appearance is nonspecific and with both large
in small bowel dilation, the findings suggest ileus. Colonic
obstruction is considered less likely.
3. No intra-abdominal free air.

## 2016-10-16 IMAGING — MR MR ANKLE*R* W/O CM
4 of 5 series · 19 of 40 positions shown · non-contrast
Comparison: None.

CLINICAL DATA: History of fall 09/30/2014. Right ankle swelling
with a scabbed area on the medial aspect of the ankle. Recent
diagnosis of cellulitis.

EXAM:
MRI OF THE RIGHT ANKLE WITHOUT CONTRAST
TECHNIQUE: Multiplanar, multisequence MR imaging of the ankle was performed. No
intravenous contrast was administered.

[Series 2: T1 · axial · 4.0mm · 0.29mm/px · z∈[-45,+139]mm · 8 of 38 slices shown (1 of 2)]
[im 1/38]
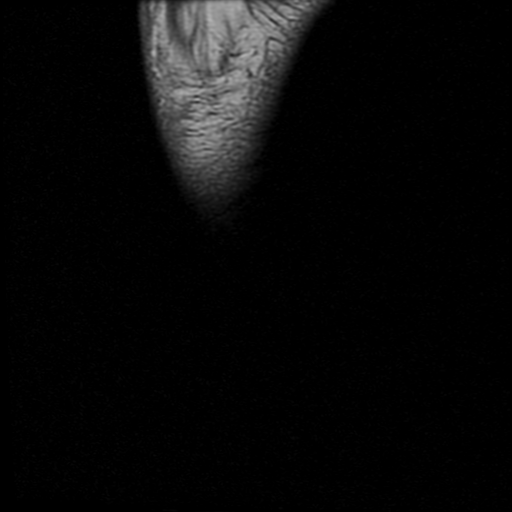
[im 5/38]
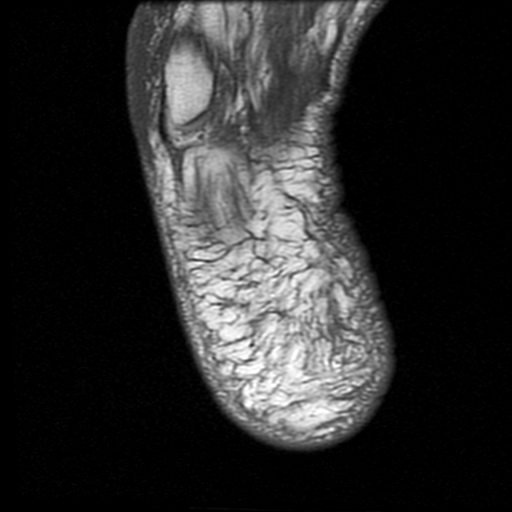
[im 13/38]
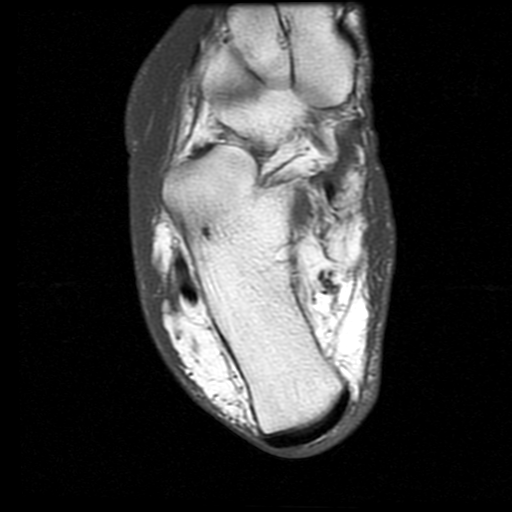
[im 17/38]
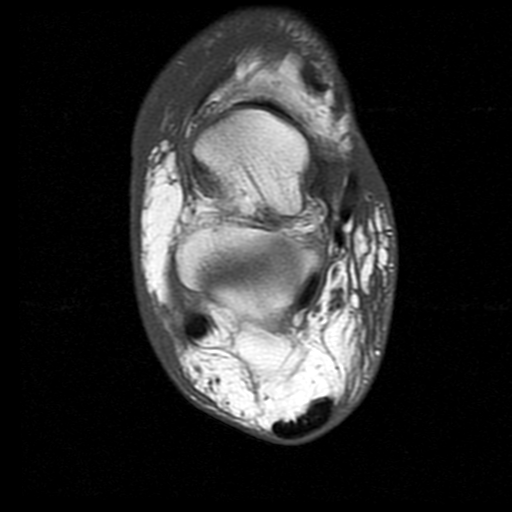
[im 21/38]
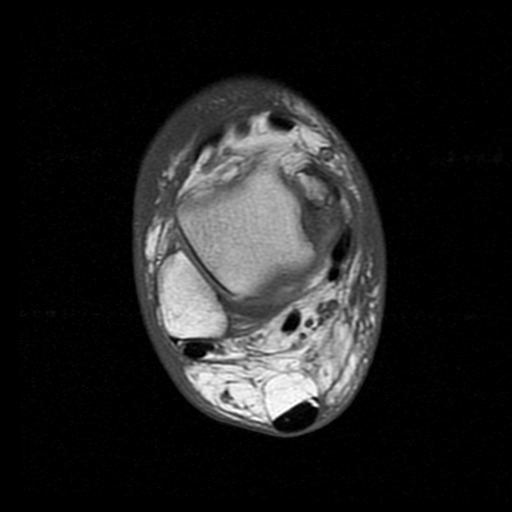
[im 25/38]
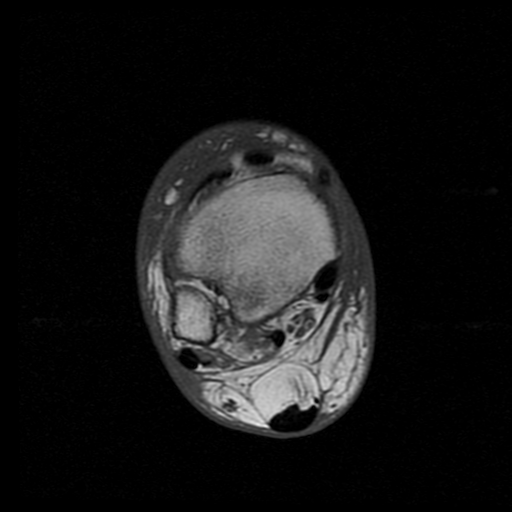
[im 33/38]
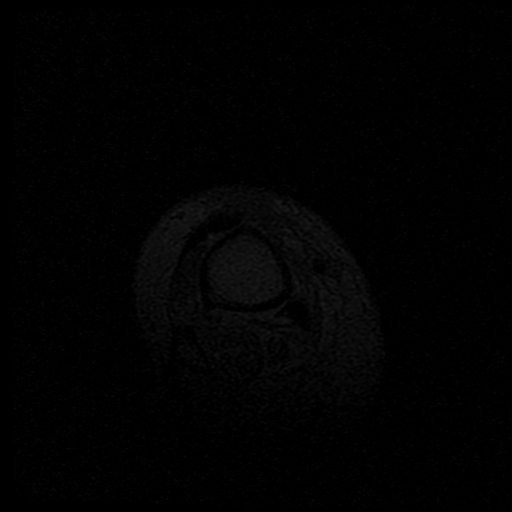
[im 38/38]
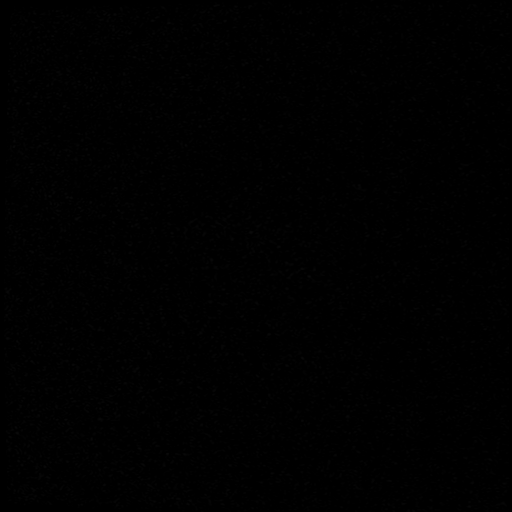

[Series 3: T2 · axial · 4.0mm · 0.29mm/px · z∈[-25,+114]mm · 3 of 38 slices shown]
[im 5/38]
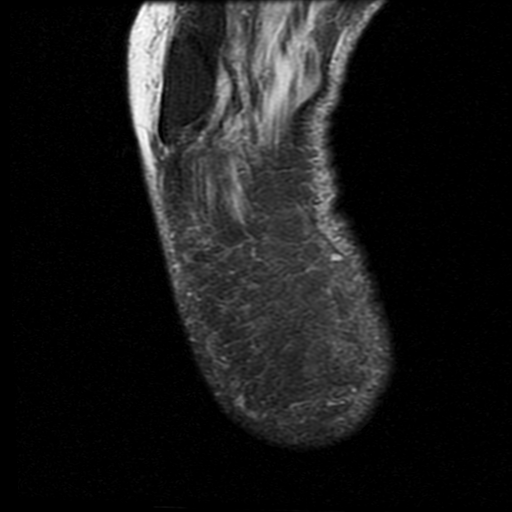
[im 21/38]
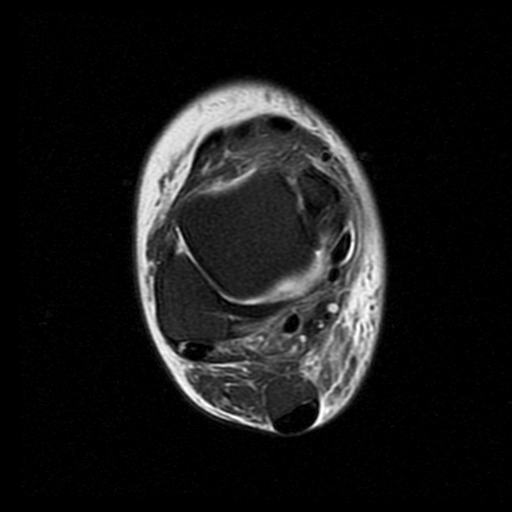
[im 33/38]
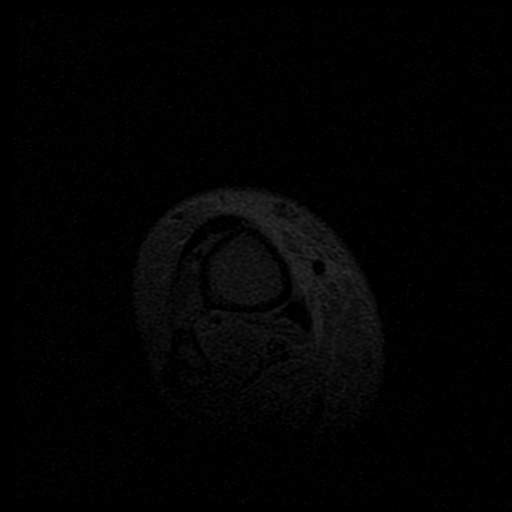

[Series 4: T1 · coronal · 4.0mm · 0.31mm/px · 5 of 24 slices shown (2 of 2)]
[im 1/24]
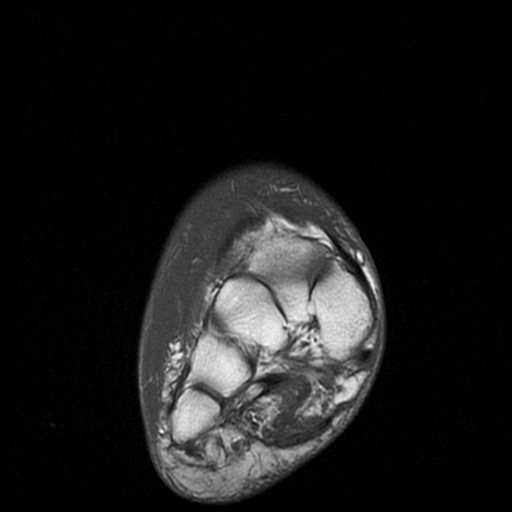
[im 4/24]
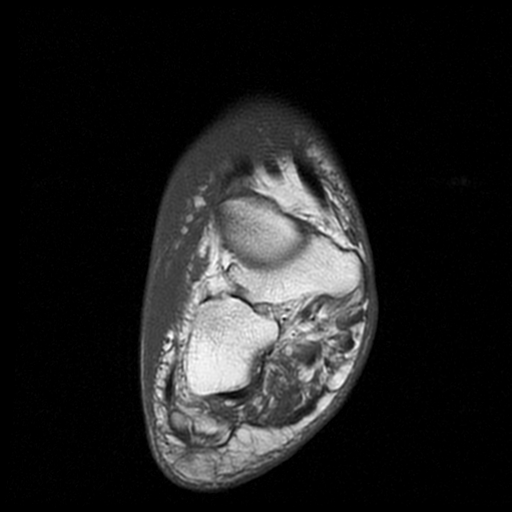
[im 8/24]
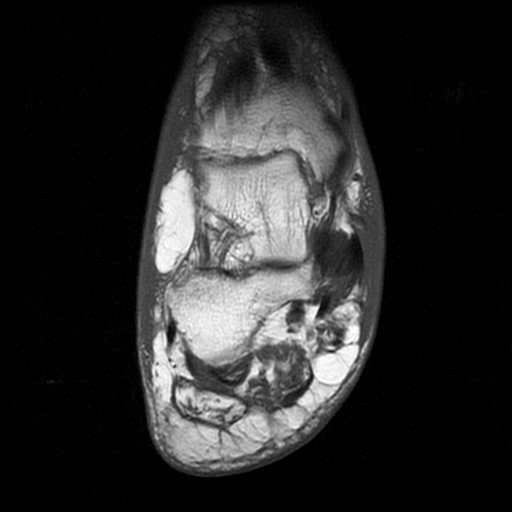
[im 12/24]
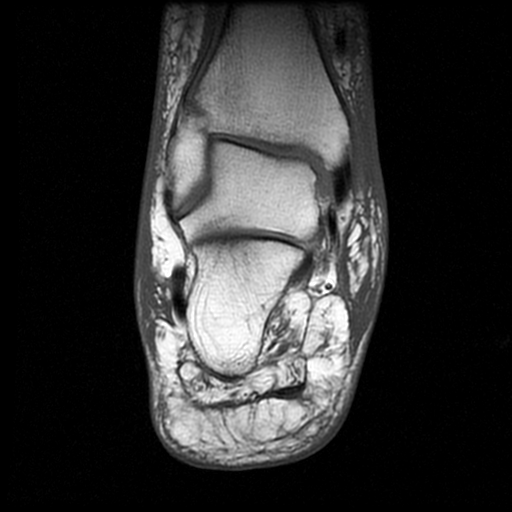
[im 20/24]
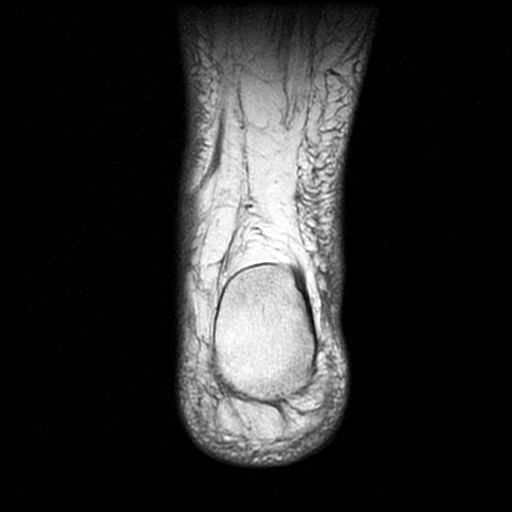

[Series 5: STIR · coronal · 4.0mm · 0.31mm/px · 3 of 24 slices shown]
[im 4/24]
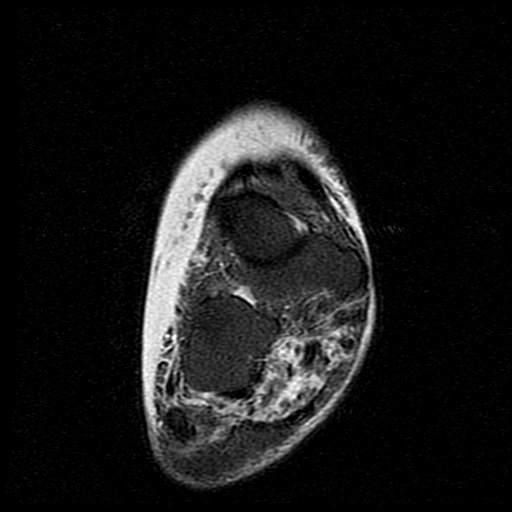
[im 12/24]
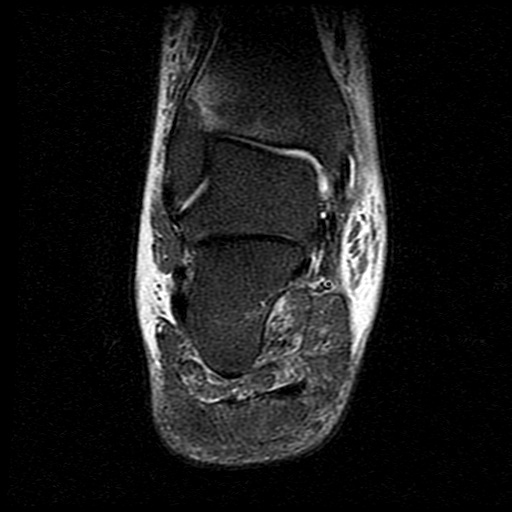
[im 20/24]
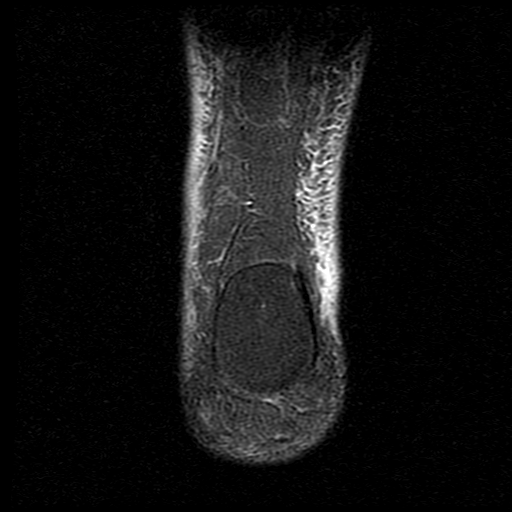

[19 of 40 positions shown; findings below may reference images not displayed]

FINDINGS: Extensive subcutaneous edema is seen about the ankle without focal
fluid collection. Edema appears diffuse.

TENDONS

Peroneal: Intact.

Posteromedial: Intact.

Anterior: Intact.

Achilles: Intact.

Plantar Fascia: Unremarkable.

LIGAMENTS

Lateral: Intact.

Medial: Intact.

CARTILAGE

Ankle Joint: Unremarkable.

Subtalar Joints/Sinus Tarsi: Unremarkable.

Bones: There is marrow edema in the tibia eccentric on the lateral
side and posteriorly. A small focus of milder marrow edema is seen
in the anterior aspect of the lateral, distal tibia. The appearance
is most suggestive of contusion rather than osteomyelitis.
IMPRESSION: Extensive subcutaneous edema about the ankle without focal fluid
collection is consistent with cellulitis.

Marrow edema in the distal tibia eccentric on the lateral side and
posterior is likely secondary to bone contusion given history of
fall and reported scab on the medial aspect of the ankle. Although
thought much less likely, osteomyelitis cannot be completely
excluded.

## 2016-11-01 MED FILL — TAGRISSO 80 MG TABLET: 80 | 30 days supply | Qty: 30 | Fill #2

## 2016-11-10 IMAGING — CT CT CHEST W/ CM
2 of 3 series · 15 of 36 positions shown, 18 images · IV contrast (omnipaque)
Comparison: Acute abdomen series of 10/20/2014.

CLINICAL DATA: Metastatic small cell lung cancer to the brain
diagnosed multiple times, including [DATE]. Chemotherapy in progress.
Gamma knife therapy complete in 8338. Radiation therapy complete in
[DATE]. Thyroid cancer diagnosed [DATE]. No current complaints.

EXAM:
CT CHEST WITH CONTRAST
TECHNIQUE: Multidetector CT imaging of the chest was performed during
intravenous contrast administration.
CONTRAST:  80mL OMNIPAQUE IOHEXOL 300 MG/ML  SOLN

[Series 2: chest with st · axial · 0.69mm/px · z∈[-248,-4]mm · 12 of 59 slices shown, 15 images]
[im 5/59  mediastinal]
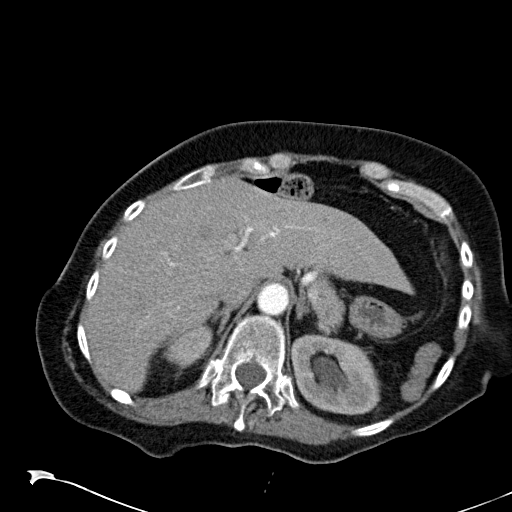
[im 5/59  lung]
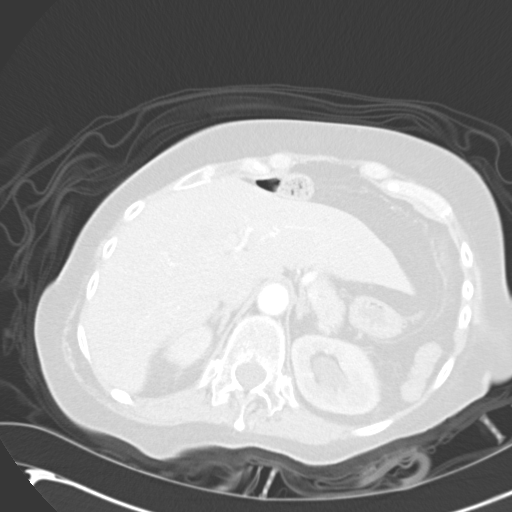
[im 9/59  lung]
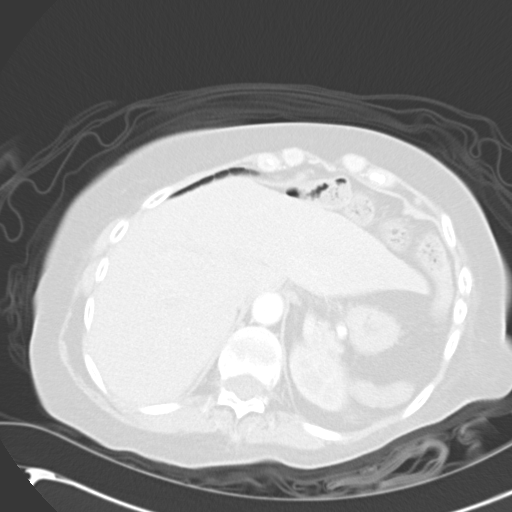
[im 13/59  lung]
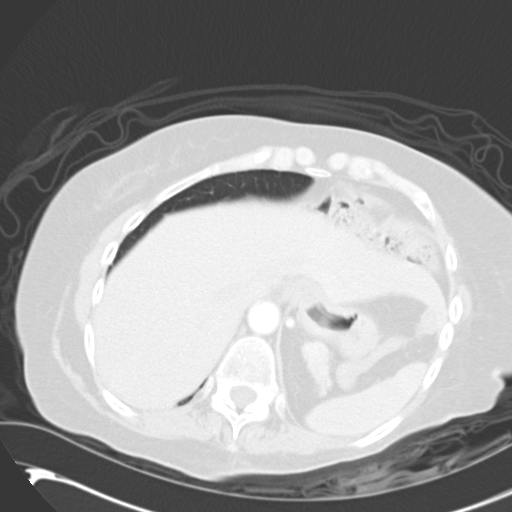
[im 18/59  lung]
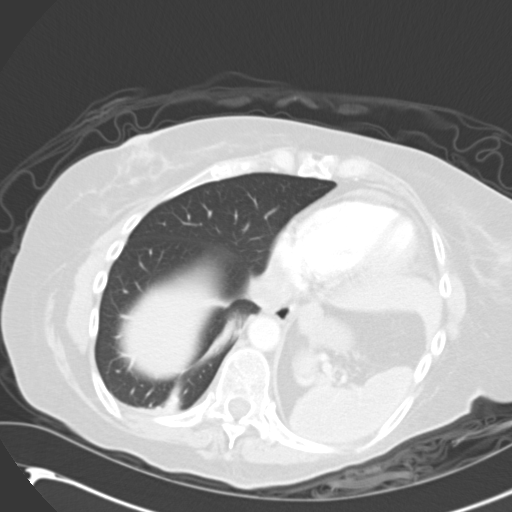
[im 22/59  mediastinal]
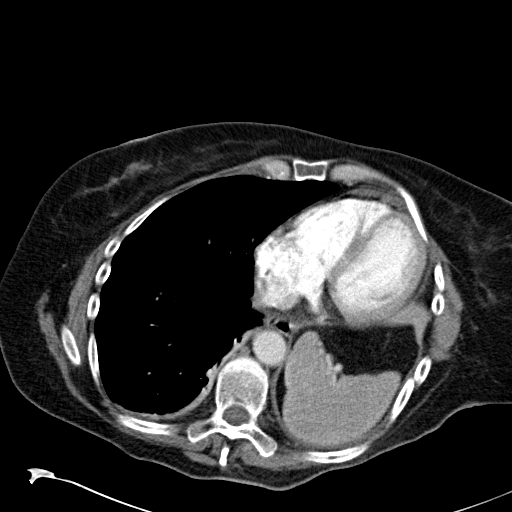
[im 22/59  lung]
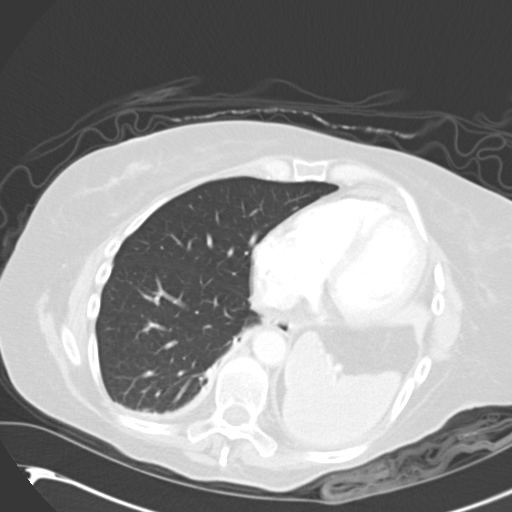
[im 26/59  lung]
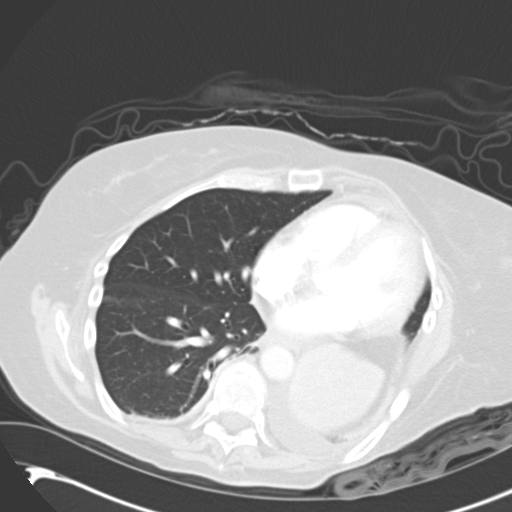
[im 33/59  lung]
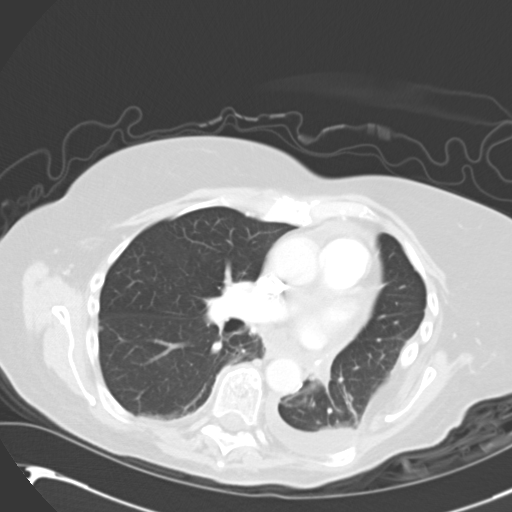
[im 37/59  lung]
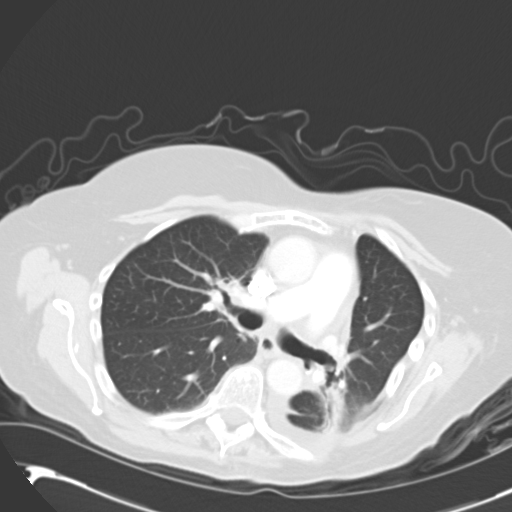
[im 41/59  mediastinal]
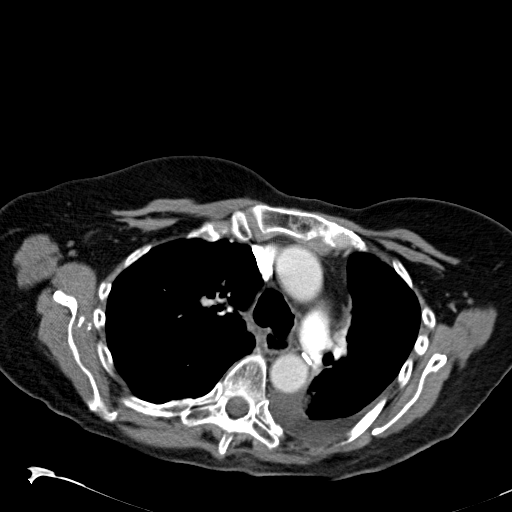
[im 41/59  lung]
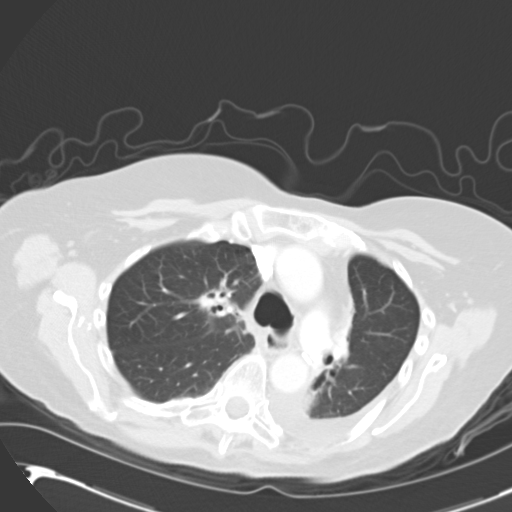
[im 46/59  lung]
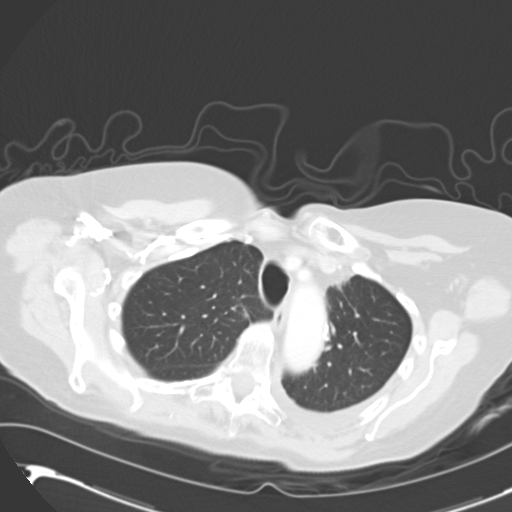
[im 50/59  lung]
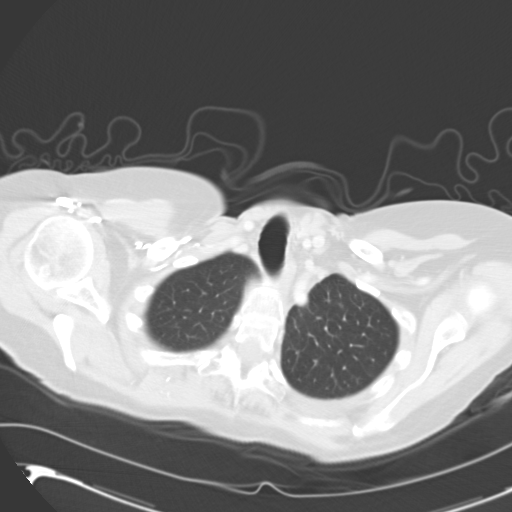
[im 54/59  lung]
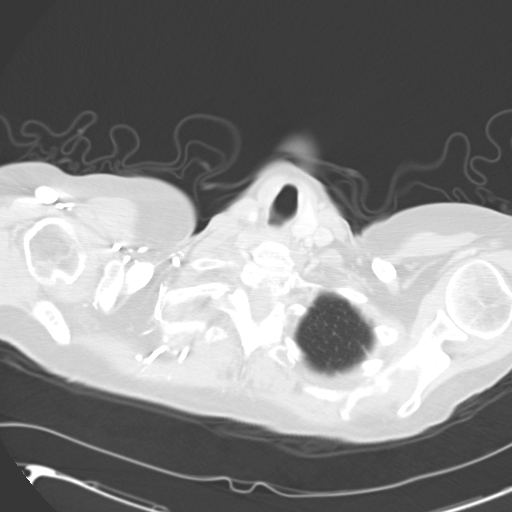

[Series 602: <mpr thick range> · coronal · 0.69mm/px · 3 of 78 slices shown]
[im 16/78  lung]
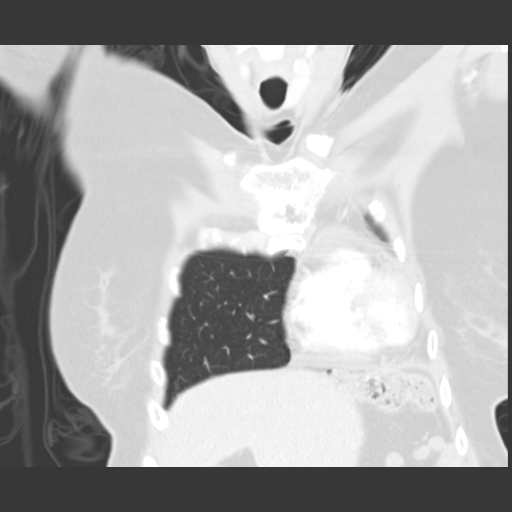
[im 31/78  lung]
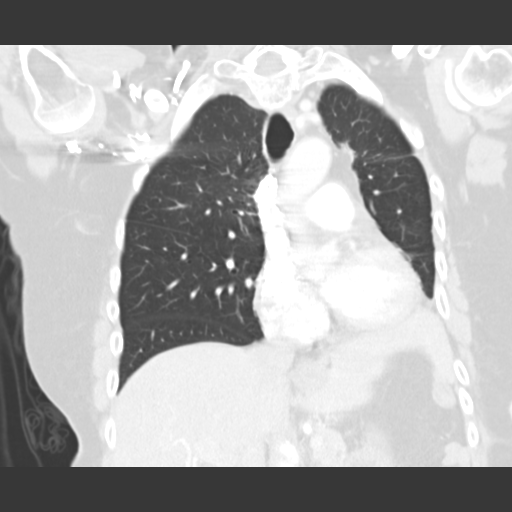
[im 47/78  lung]
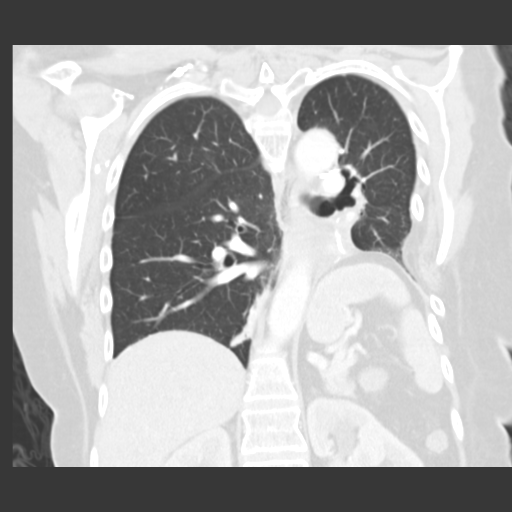

[15 of 36 positions shown; findings below may reference images not displayed]

Most recent chest
CT of 02/16/2014. Abdominal pelvic CT of 10/20/2014 also reviewed.
FINDINGS: Mediastinum/Nodes: No supraclavicular adenopathy. Resolution of
previously described enlarging left subpectoral and axillary nodes.
Tortuous descending thoracic aorta. Mild cardiomegaly with small
pericardial effusion. Likely physiologic. No central pulmonary
embolism, on this non-dedicated study. No mediastinal or hilar
adenopathy. Esophageal air-fluid level, including on image 24.

Lungs/Pleura: Small left pleural effusion which is similar to on
10/20/2014.

Surgical changes of left lower lobectomy.

Bilateral upper lobe predominant paramediastinal radiation fibrosis.
Similar on the left and slightly improved on the right.

5 mm right upper lobe pulmonary nodule on image 22 is similar.

New dependent atelectasis in the right lower lobe.

A subpleural 3 mm superior segment right lower lobe nodule on image
27 is unchanged.

3 mm left apical pulmonary nodule on image 9 is similar to slightly
decreased in size.

Left upper lobe 5 mm nodule on image 17 measured 6 mm on the prior
exam.

Decreased left lower lobe lung volume. Areas of secondary
atelectasis identified. The previously described left lower lobe
pulmonary nodules are not readily apparent.

No new or enlarging nodules.

Upper abdomen: Normal imaged portions of the liver, spleen, stomach,
pancreas, right adrenal gland. Mild left adrenal thickening is
unchanged. An incompletely imaged left-sided upper pole caliectasis
is similar to on 10/20/2014. Image 56.

Musculoskeletal: Left-sided thoracotomy changes. Displaced sternal
body fracture which is new on sagittal image 54. Suspicion of
underlying lucent lesion on sagittal image 53. No metastasis in this
region on the prior exam.

Sclerosis within the sternal manubrium is similar and favored to be
related to a bone island.

Accentuation of expected thoracic kyphosis.
IMPRESSION: 1. Resolution of previously described enlarging left axillary and
subpectoral nodes.
2. Similar to decreased size of pulmonary nodules. No new or
progressive disease identified within the extraosseous chest.
3. A small left pleural effusion is unchanged.
4. Left lower lobectomy with similar to slight improvement in upper
lobe predominant radiation changes.
5. Left upper pole caliectasis is similar to on the 10/20/2014
abdominal study. Incompletely imaged.
6. Since the 01/20/2014, sternal body displaced fracture. Correlate
with trauma in this area. Cannot exclude underlying metastasis with
pathologic fracture.
7. Esophageal air fluid level suggests dysmotility or
gastroesophageal reflux.

## 2016-12-04 ENCOUNTER — Other Ambulatory Visit: Payer: Self-pay | Admitting: Internal Medicine

## 2016-12-04 DIAGNOSIS — C3411 Malignant neoplasm of upper lobe, right bronchus or lung: Secondary | ICD-10-CM

## 2016-12-05 MED FILL — TAGRISSO 80 MG TABLET: 80 | 30 days supply | Qty: 30 | Fill #0

## 2017-01-02 MED FILL — TAGRISSO 80 MG TABLET: 80 | 30 days supply | Qty: 30 | Fill #1

## 2017-01-28 MED FILL — TAGRISSO 80 MG TABLET: 80 | 30 days supply | Qty: 30 | Fill #2

## 2017-02-10 IMAGING — CT CT CHEST W/ CM
2 of 5 series · 15 of 46 positions shown, 17 images · IV contrast (OMNIPAQUE)
Comparison: Chest CT 11/15/2014.  Abdominal pelvic CT 10/20/2014.

CLINICAL DATA: Metastatic non-small-cell lung cancer diagnosed most
current complaints. Right lung primary.

EXAM:
CT CHEST, ABDOMEN, AND PELVIS WITH CONTRAST
TECHNIQUE: Multidetector CT imaging of the chest, abdomen and pelvis was
performed following the standard protocol during bolus
administration of intravenous contrast.
CONTRAST:  50mL OMNIPAQUE IOHEXOL 300 MG/ML SOLN, 100mL OMNIPAQUE
IOHEXOL 300 MG/ML SOLN

[Series 2: cap with st · axial · 0.77mm/px · z∈[-625,-40]mm · 12 of 133 slices shown, 14 images]
[im 8/133  soft-tissue]
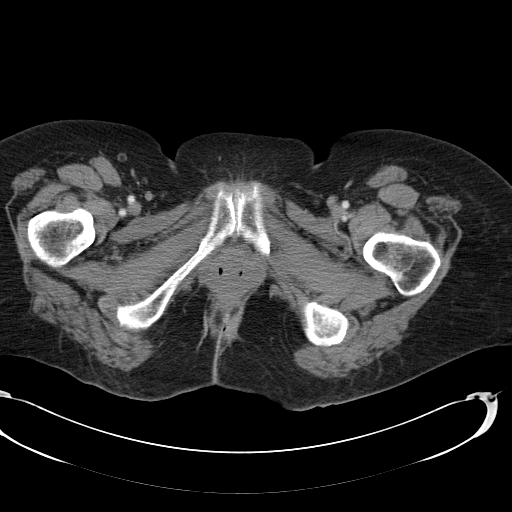
[im 8/133  bone]
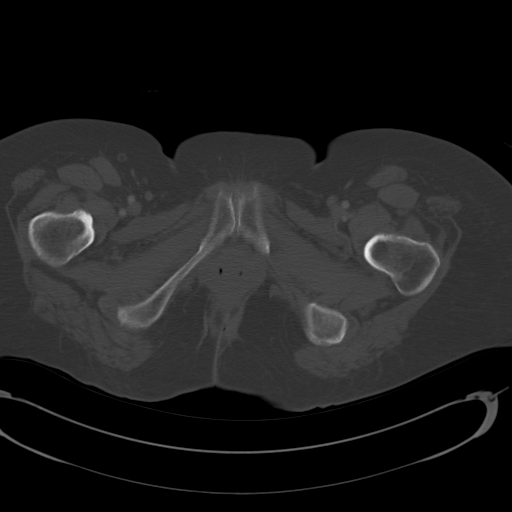
[im 23/133  soft-tissue]
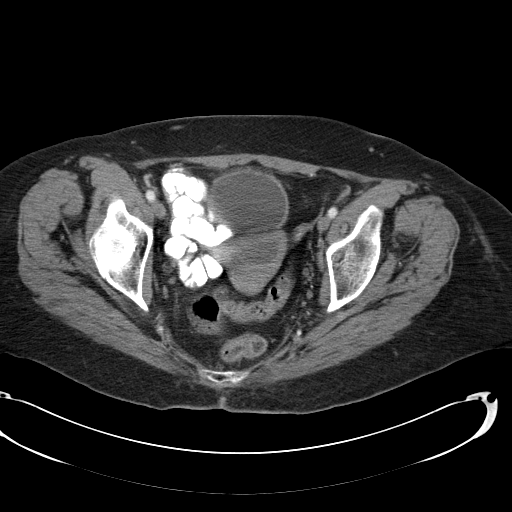
[im 30/133  soft-tissue]
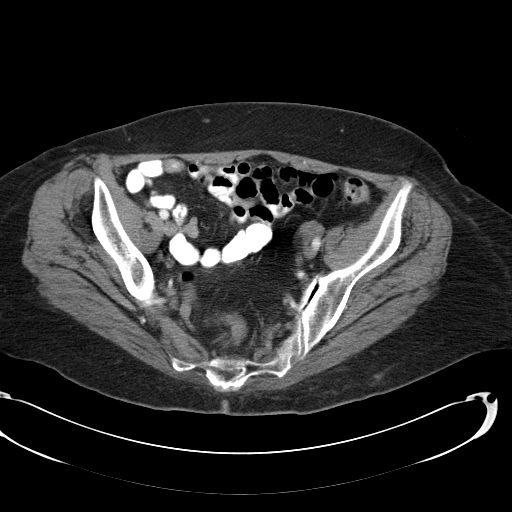
[im 37/133  soft-tissue]
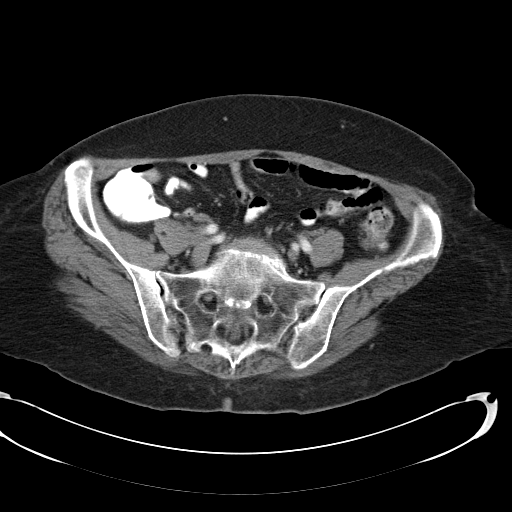
[im 52/133  soft-tissue]
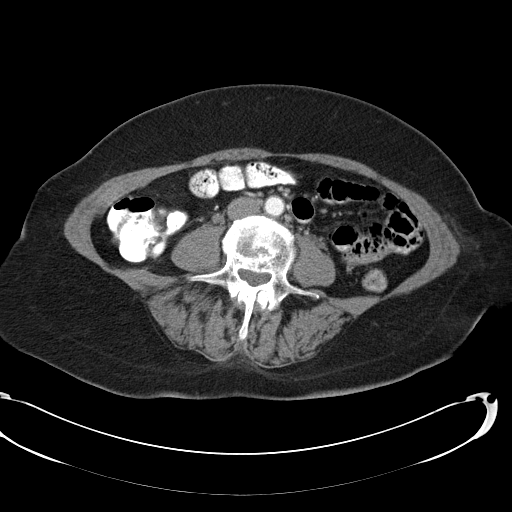
[im 59/133  soft-tissue]
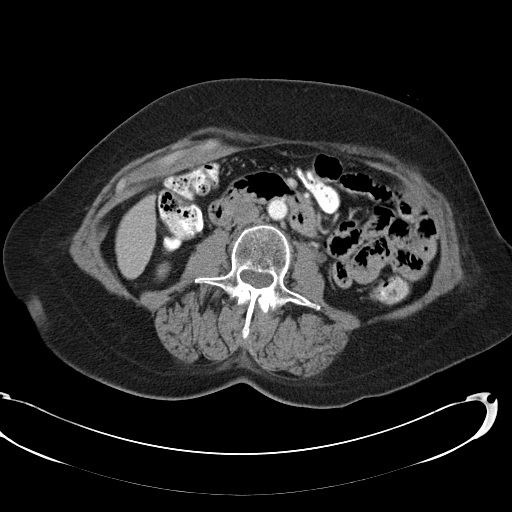
[im 74/133  soft-tissue]
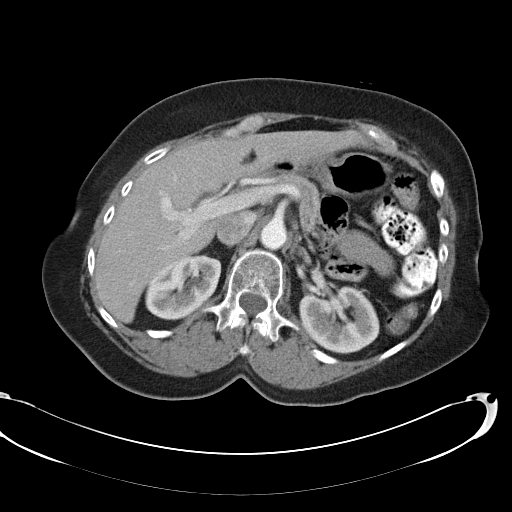
[im 81/133  soft-tissue]
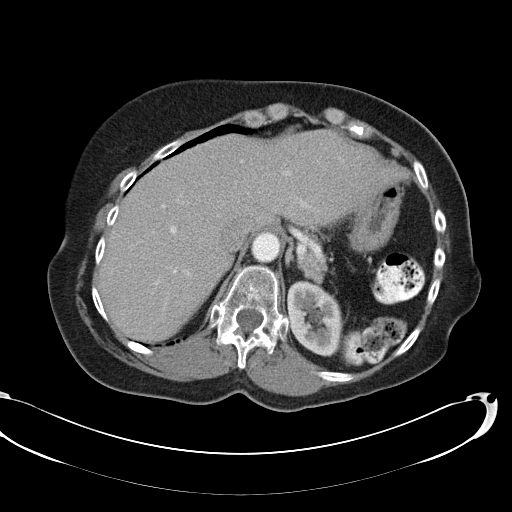
[im 96/133  soft-tissue]
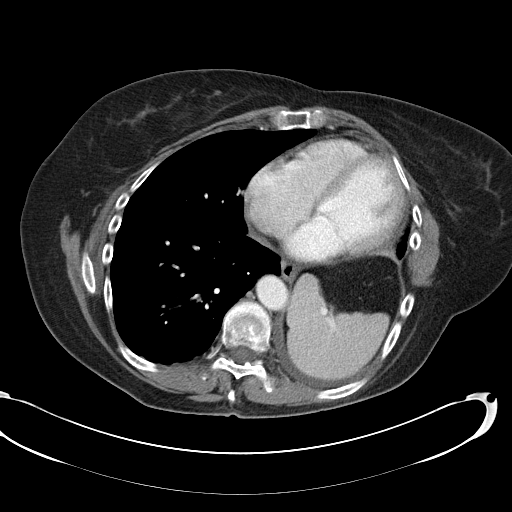
[im 96/133  bone]
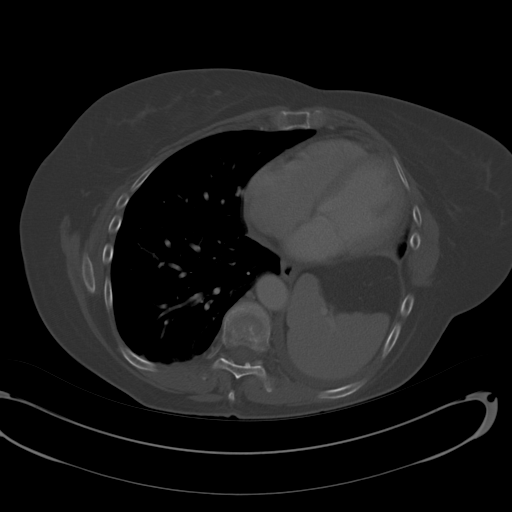
[im 103/133  soft-tissue]
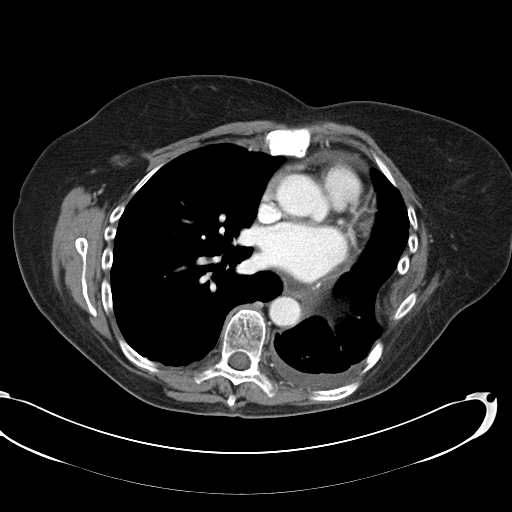
[im 111/133  soft-tissue]
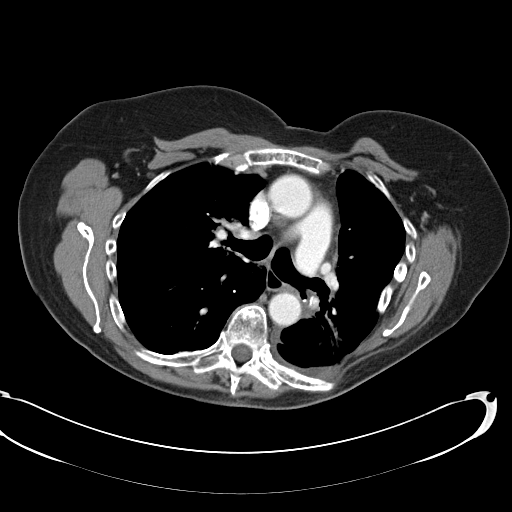
[im 125/133  soft-tissue]
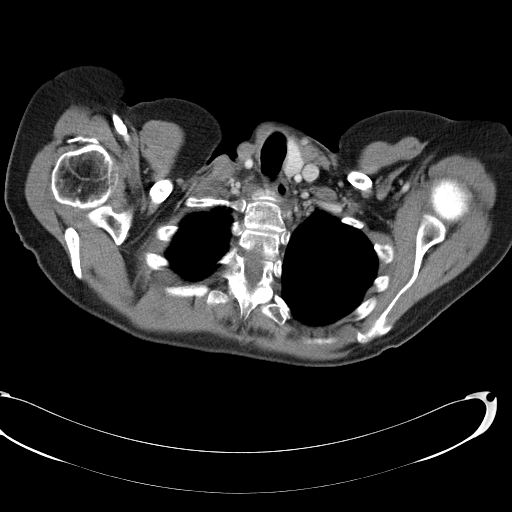

[Series 602: <mpr thick range> · coronal · 1.29mm/px · 3 of 89 slices shown]
[im 30/89  soft-tissue]
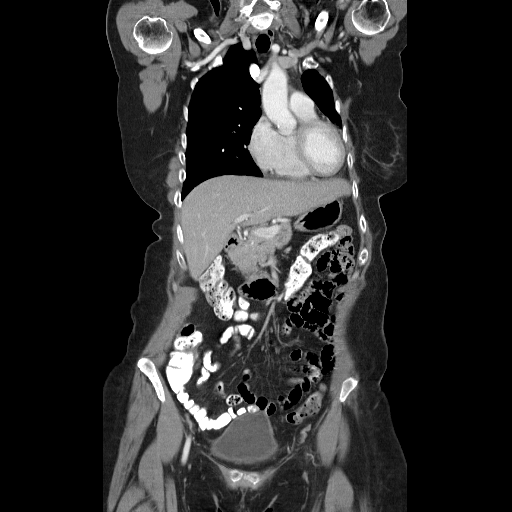
[im 40/89  soft-tissue]
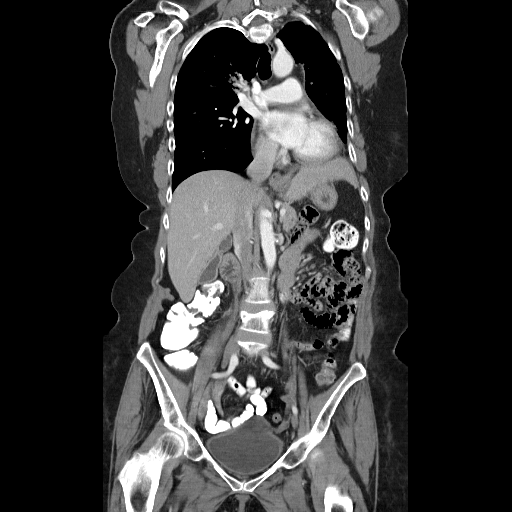
[im 49/89  soft-tissue]
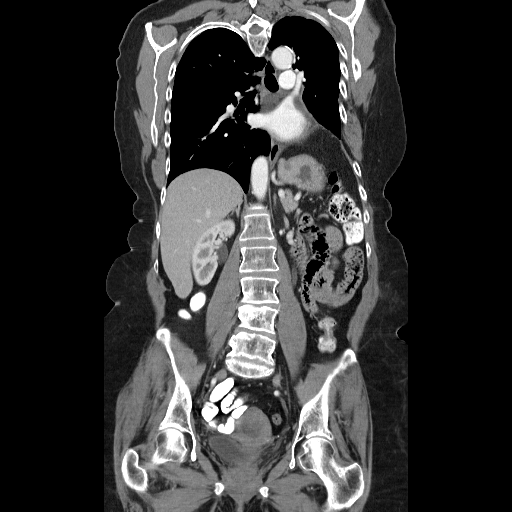

[15 of 46 positions shown; findings below may reference images not displayed]

FINDINGS: CT CHEST FINDINGS

Mediastinum/Nodes: No supraclavicular adenopathy. No axillary
adenopathy. Left subpectoral nodes measure up to 5 mm on image 16
and are similar. Not pathologic by size criteria. Tortuous
descending thoracic aorta. Mild cardiomegaly with trace pericardial
fluid or thickening, likely physiologic. No central pulmonary
embolism, on this non-dedicated study. No mediastinal or hilar
adenopathy.

Esophageal fluid level again identified.

Lungs/Pleura: Similar left-sided pleural fluid with minimal
loculation superiorly. Surgical changes of left lower lobectomy.

Right upper lobe peribronchovascular presumed radiation change is
similar with no evidence of locally recurrent disease. There is also
stone left upper lobe radiation fibrosis.

4 mm right upper lobe pulmonary nodule on image 22 is unchanged.

Subpleural right lower lobe pulmonary nodule on image 27 is similar.

3 mm left apical pulmonary nodule is similar on image 10.

Vague left anterior 5 mm lung nodule is similar.

Musculoskeletal: No acute osseous abnormality.

CT ABDOMEN AND PELVIS FINDINGS

Hepatobiliary: Too small to characterize lateral segment left liver
lobe low-density lesion is unchanged. No suspicious liver lesion.
Normal gallbladder, without biliary ductal dilatation.

Pancreas: Normal, without mass or ductal dilatation.

Spleen: Normal

Adrenals/Urinary Tract: Normal adrenal glands. Partially duplicated
left renal collecting system. Improvement in now minimal left-sided
lower pole caliectasis. No obstructive cause identified. Resolved
bladder distension.

Stomach/Bowel: Normal stomach, without wall thickening. Normal
colon, appendix, and terminal ileum. Normal small bowel.

Vascular/Lymphatic: Normal caliber of the aorta. Right renal artery
aneurysm measures 1.0 cm on image 64 and is similar to 10/20/2014.
No abdominopelvic adenopathy.

Reproductive: Suspect a uterine fundal fibroid, with vague soft
tissue fullness in hypoattenuation on image 111. This has been
present back to 01/20/2014. No adnexal mass.

Other: No significant free fluid. No evidence of omental or
peritoneal disease.

Musculoskeletal: Superior endplate compression deformity at L4 is
eccentric right and not significantly changed.
IMPRESSION: CT CHEST IMPRESSION

1. Surgical changes of left lower lobectomy. Radiation changes in
the right upper lobe. No locally recurrent or progressive disease.
2. Similar small left pleural effusion with mild loculation.
3. Similar scattered pulmonary nodules.
4. Esophageal air fluid level suggests dysmotility or
gastroesophageal reflux.

CT ABDOMEN AND PELVIS IMPRESSION

1. No acute process or evidence of metastatic disease in the abdomen
or pelvis.
2. Similar 1.0 cm right renal artery aneurysm.
3. Uterine soft tissue fullness which is favored to be related to an
underlying fibroid. If there are symptoms of postmenopausal
bleeding, consider ultrasound.
4. Resolution of bladder distension and improvement in lower pole
left-sided caliectasis; at least partially duplicated left renal
collecting system.

## 2017-02-18 ENCOUNTER — Other Ambulatory Visit: Payer: Self-pay | Admitting: Internal Medicine

## 2017-02-18 DIAGNOSIS — C3411 Malignant neoplasm of upper lobe, right bronchus or lung: Secondary | ICD-10-CM

## 2017-02-21 ENCOUNTER — Telehealth: Payer: Self-pay | Admitting: Internal Medicine

## 2017-02-21 NOTE — Telephone Encounter (Signed)
Scheduled appt per sch  Message from [facility] 7/3 - patient is aware of appt time and date.

## 2017-02-22 ENCOUNTER — Other Ambulatory Visit: Payer: Self-pay | Admitting: Internal Medicine

## 2017-02-22 DIAGNOSIS — C3411 Malignant neoplasm of upper lobe, right bronchus or lung: Secondary | ICD-10-CM

## 2017-02-22 MED FILL — TAGRISSO 80 MG TABLET: 80 | 30 days supply | Qty: 30 | Fill #0

## 2017-02-22 NOTE — Telephone Encounter (Signed)
The patient was not seen for a while and she does not have a follow-up appointment. She will need follow-up appointment for reevaluation and repeating blood work for her to continue on treatment with Tagrisso. I will not order any further refills until she comes for evaluation.

## 2017-03-18 MED FILL — TAGRISSO 80 MG TABLET: 80 | 30 days supply | Qty: 30 | Fill #1

## 2017-03-26 ENCOUNTER — Encounter: Payer: Self-pay | Admitting: Internal Medicine

## 2017-03-26 ENCOUNTER — Ambulatory Visit (HOSPITAL_BASED_OUTPATIENT_CLINIC_OR_DEPARTMENT_OTHER): Payer: Medicare Other | Admitting: Internal Medicine

## 2017-03-26 ENCOUNTER — Other Ambulatory Visit (HOSPITAL_BASED_OUTPATIENT_CLINIC_OR_DEPARTMENT_OTHER): Payer: Medicare Other

## 2017-03-26 ENCOUNTER — Telehealth: Payer: Self-pay | Admitting: Internal Medicine

## 2017-03-26 VITALS — BP 116/70 | HR 94 | Temp 97.5°F | Resp 19 | Ht 67.0 in | Wt 177.0 lb

## 2017-03-26 DIAGNOSIS — C719 Malignant neoplasm of brain, unspecified: Secondary | ICD-10-CM

## 2017-03-26 DIAGNOSIS — Z5111 Encounter for antineoplastic chemotherapy: Secondary | ICD-10-CM

## 2017-03-26 DIAGNOSIS — C7931 Secondary malignant neoplasm of brain: Secondary | ICD-10-CM | POA: Diagnosis not present

## 2017-03-26 DIAGNOSIS — C3411 Malignant neoplasm of upper lobe, right bronchus or lung: Secondary | ICD-10-CM | POA: Diagnosis not present

## 2017-03-26 DIAGNOSIS — R519 Headache, unspecified: Secondary | ICD-10-CM

## 2017-03-26 DIAGNOSIS — R51 Headache: Secondary | ICD-10-CM

## 2017-03-26 LAB — COMPREHENSIVE METABOLIC PANEL
ALT: 11 U/L (ref 0–55)
AST: 13 U/L (ref 5–34)
Albumin: 4.1 g/dL (ref 3.5–5.0)
Alkaline Phosphatase: 61 U/L (ref 40–150)
Anion Gap: 9 mEq/L (ref 3–11)
BUN: 15.6 mg/dL (ref 7.0–26.0)
CO2: 26 mEq/L (ref 22–29)
Calcium: 10 mg/dL (ref 8.4–10.4)
Chloride: 106 mEq/L (ref 98–109)
Creatinine: 1 mg/dL (ref 0.6–1.1)
EGFR: 63 mL/min/{1.73_m2} — ABNORMAL LOW (ref >90)
Glucose: 94 mg/dl (ref 70–140)
Potassium: 3.8 mEq/L (ref 3.5–5.1)
Sodium: 141 mEq/L (ref 136–145)
Total Bilirubin: 0.63 mg/dL (ref 0.20–1.20)
Total Protein: 6.9 g/dL (ref 6.4–8.3)

## 2017-03-26 LAB — CBC WITH DIFFERENTIAL/PLATELET
BASO%: 0.3 % (ref 0.0–2.0)
Basophils Absolute: 0 10*3/uL (ref 0.0–0.1)
EOS%: 1 % (ref 0.0–7.0)
Eosinophils Absolute: 0.1 10*3/uL (ref 0.0–0.5)
HCT: 41 % (ref 34.8–46.6)
HGB: 13.5 g/dL (ref 11.6–15.9)
LYMPH%: 12 % — ABNORMAL LOW (ref 14.0–49.7)
MCH: 30.4 pg (ref 25.1–34.0)
MCHC: 33 g/dL (ref 31.5–36.0)
MCV: 92.2 fL (ref 79.5–101.0)
MONO#: 0.5 10*3/uL (ref 0.1–0.9)
MONO%: 8.6 % (ref 0.0–14.0)
NEUT#: 4.8 10*3/uL (ref 1.5–6.5)
NEUT%: 78.1 % — ABNORMAL HIGH (ref 38.4–76.8)
Platelets: 183 10*3/uL (ref 145–400)
RBC: 4.45 10*6/uL (ref 3.70–5.45)
RDW: 14.1 % (ref 11.2–14.5)
WBC: 6.1 10*3/uL (ref 3.9–10.3)
lymph#: 0.7 10*3/uL — ABNORMAL LOW (ref 0.9–3.3)

## 2017-03-26 NOTE — Progress Notes (Signed)
[place] Telephone:([phone]) [phone]   Fax:([phone]) [phone]  OFFICE PROGRESS NOTE  [name], [address] [place] [address]  PRINCIPAL DIAGNOSIS:  metastatic non-small cell lung cancer, adenocarcinoma with brain metastases initially diagnosed as a stage IIb in October 2002.   BIOMARKERS TESTING ([facility]): Positive for: EGFR [id number] del, T790M, CTNNB1 D32G, SMAD4 T135f*10 Negative for: RET, ALK, BRAF, KRAS, ERBB2 and MET  PRIOR THERAPY:  1. Status post left lower lobectomy under the care of Dr BArlyce Dicein October 2002. 2. Status post course of concurrent chemoradiation with weekly carboplatin and paclitaxel under the care of Dr SBenay Spiceand Dr WElba Barmanin early 2003. 3. Status post treatment [facility] Celebrex [facility] 4. Status post left occipital craniotomy for tumor resection on September 21, 2005, under the care of Dr CChristella Noa 5. Status post whole brain irradiation under the care of Dr WElba Barman completed November 07, 2005. 6. Status post gamma knife [facility] 7. Status post right thyroidectomy with radical neck dissection under the care of Dr. RConstance Holsteron 10/18/2011. 8. Status post excisional biopsy of right cervical lymph node that was consistent with metastatic adenocarcinoma. 9. Tarceva 150 mg by mouth daily with therapy beginning 06/03/2007, discontinued in July 2015 secondary to disease progression. Status post 79 cycles. 10. palliative radiotherapy to the enlarging right upper lobe lung nodule under the care of Dr. MValere Dross [facility]. [id number] 300 mg by mouth daily for 21 days discontinued secondary to study deviation and [facility] the [facility] recent [facility]. 12. Open reduction and  internal fixation of trimalleolar ankle fracture dislocation with fixation of the fibula as well as medial          malleolus.   CURRENT THERAPY: Tagrisso 80 mg by mouth daily started 11/25/2014, status post 27 months of treatment.   INTERVAL HISTORY: Valerie NEWHALL71y.o. female returns to the clinic today for follow-up visit. The patient was lost to follow-up since December 2017. She missed and cancel several of her appointment. She does not remember that she canceled her appointment. She denied having any current complaints. She denied having any chest pain, shortness of breath, cough or hemoptysis. She has no significant weight loss or night sweats. She has no nausea, vomiting, diarrhea or constipation. She has been tolerating her treatment with Tagrisso fairly well. She has no significant skin rash or diarrhea. She is here today for evaluation and repeat blood work.   MEDICAL HISTORY: Past Medical History:  Diagnosis Date  . Abnormal Pap smear 2006  . Anal fistula   . Ankle fracture   . Anxiety   . Arthritis   . Atrophic vaginitis 2008  . Cataract   . Dementia 2009  . Dyspareunia 2008  . H/O osteoporosis   . H/O varicella   . H/O vitamin D deficiency   . Headache 07/24/2016  . Heart murmur   . History of measles, mumps, or rubella   . History of radiation therapy 07/28/13- 08/10/13   right lung metastasis 5000 cGy 10 sessions  . Hypertension   . [facility] [place] [place]  . [facility] [place]   . [facility]   . [place] ([facility]  PET scan 05/05/13, RUL lung nodule  . 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 Nodule of right lung CT- 06/03/12   RIGHT UPPER LOBE  . Nodule of right lung 06/03/12   Upper Lobe  . On antineoplastic chemotherapy    TARCEVA  . Osteoporosis 2010  . Primary cancer of right upper lobe of lung ([place]) 04/22/2009   Qualifier: Diagnosis of  By: Nils Pyle CMA ([place]), Mearl Latin    .  Shortness of breath    hx lung ca   . Status post chemotherapy 2003   CARBOPLATIN/PACLITAXEL /STATUS POST CLINICAL TRAIL OF CELEBREX AND IRESSA AT [facility] FOR 1 YEAR  . Status post radiation therapy 2003   LEFT LUNG  . Status post radiation therapy 11/07/2005   WHOLE BRAIN: DR Larkin [facility]  . Status post radiation therapy 06/02/2008   GAMMA KNIFE OF RESECTED CAVITAY  . Thyroid adenoma    ?  [name] Thyroid cancer ([place]) 10/18/11 bx   adenoid nodules   . Yeast infection     ALLERGIES:  has No Known Allergies.  MEDICATIONS:  Current Outpatient Prescriptions  Medication Sig Dispense Refill  . alendronate (FOSAMAX) 70 MG tablet Take 1 tablet (70 mg total) by mouth every 7 (seven) days. Saturdays; Take with a full glass of water on an empty stomach. 4 tablet 12  . Ascorbic Acid (VITAMIN C) 100 MG tablet Take 100 mg by mouth daily.    . cephALEXin (KEFLEX) 500 MG capsule     . cholecalciferol (VITAMIN D) 1000 UNITS tablet Take 1,000 Units by mouth at bedtime.    . donepezil (ARICEPT) 10 MG tablet Take 10 mg by mouth daily.  0  . loperamide (IMODIUM) 2 MG capsule Take 2 mg by mouth as needed for diarrhea or loose stools.     . Multiple Vitamin (MULTI-VITAMINS) TABS Take 1 tablet by mouth daily.     . naproxen sodium (ANAPROX) 220 MG tablet Take 220 mg by mouth 2 (two) times daily as needed (pain). Reported on 11/22/2015    . Osimertinib Mesylate (TAGRISSO PO)   2  . sulfamethoxazole-trimethoprim (BACTRIM DS,SEPTRA DS) 800-160 MG tablet     . TAGRISSO 80 MG tablet TAKE 1 TABLET BY MOUTH DAILY. 30 tablet 2  . trimethoprim (TRIMPEX) 100 MG tablet     . XARELTO 20 MG TABS tablet      No current facility-administered medications for this visit.     SURGICAL HISTORY:  Past Surgical History:  Procedure Laterality Date  . BRAIN SURGERY    . LEFT LOWER LOBECTOMY  05/2001  . LEFT OCCIPITAL CRANIOTOMY  09/21/2005   tumor  . LUNG LOBECTOMY    . NECK SURGERY    . ORIF ANKLE FRACTURE Left 10/02/2014    Procedure: OPEN REDUCTION INTERNAL FIXATION (ORIF) ANKLE FRACTURE;  Surgeon: Alta Corning, MD;  Location: [facility];  Service: Orthopedics;  Laterality: Left;  . RADICAL NECK DISSECTION  10/18/2011   Procedure: RADICAL NECK DISSECTION;  Surgeon: Izora Gala, MD;  Location: [facility];  Service: ENT;  Laterality: Right;  RIGHT MODIFIED NECK DISSECTION /POSSIBLE RIGHT THYROIDECTOM  . THYROIDECTOMY  10/18/2011   Procedure: THYROIDECTOMY WITH RADICAL NECK DISSECTION;  Surgeon: Izora Gala, MD;  Location: [facility];  Service: ENT;  Laterality: Right;    REVIEW OF SYSTEMS:  A comprehensive review of systems was negative except for: Constitutional: positive for fatigue Musculoskeletal: positive for muscle weakness   PHYSICAL EXAMINATION:  General appearance: alert, cooperative, fatigued and no distress Head: Normocephalic, without obvious abnormality, atraumatic Neck: no adenopathy, supple, symmetrical, trachea midline and thyroid not enlarged, symmetric, no tenderness/mass/nodules Lymph nodes: Cervical, supraclavicular, and axillary nodes normal. Resp: clear to auscultation bilaterally Back: symmetric, no curvature. ROM normal. No CVA tenderness. Cardio: regular rate and rhythm, S1, S2 normal, no murmur, click, rub or gallop GI: soft, non-tender; bowel sounds normal; no masses,  no organomegaly Extremities: extremities normal, atraumatic, no cyanosis or edema  ECOG PERFORMANCE STATUS: 1 - Symptomatic but completely ambulatory  Blood pressure 116/70, pulse 94, temperature (!) 97.5 F (36.4 C), temperature source Oral, resp. rate 19, height _0  (1.702 m), weight 177 lb (80.3 kg), SpO2 100 %.  LABORATORY DATA: Lab Results  Component Value Date   WBC 6.1 03/26/2017   HGB 13.5 03/26/2017   HCT 41.0 03/26/2017   MCV 92.2 03/26/2017   PLT 183 03/26/2017      Chemistry      Component Value Date/Time   NA 139 07/20/2016 1046   K 4.0 07/20/2016 1046   CL 102 08/29/2015 0505   CL 106 12/09/2012 0806    CO2 23 07/20/2016 1046   BUN 14.3 07/20/2016 1046   CREATININE 1.0 07/20/2016 1046      Component Value Date/Time   CALCIUM 9.9 07/20/2016 1046   ALKPHOS 63 07/20/2016 1046   AST 12 07/20/2016 1046   ALT 12 07/20/2016 1046   BILITOT 0.67 07/20/2016 1046       RADIOGRAPHIC STUDIES: No results found.  ASSESSMENT AND PLAN: This is a very pleasant 71 years old African-American female with metastatic non-small cell lung cancer, adenocarcinoma with positive EGFR mutation status post several years of treatment with Tarceva and the patient developed positive resistant mutation, T790M. The patient is currently on treatment with Tagrisso status post 27 months. She has been tolerating the treatment well with no significant adverse effects. I recommended for her to have repeat CT scan of the neck, chest, abdomen and pelvis performed next week and I would see the patient back for follow-up visit at that time for discussion of her scan results and recommendation regarding further management of her condition. She was strongly advised to keep her appointment and to call if she has any concerning complains in the interval. The patient voices understanding of current disease status and treatment options and is in agreement with the current care plan. All questions were answered. The patient knows to call the clinic with any problems, questions or concerns. We can certainly see the patient much sooner if necessary. I spent 10 minutes counseling the patient face to face. The total time spent in the appointment was 15 minutes. Disclaimer: This note was dictated with voice recognition software. Similar sounding words can inadvertently be transcribed and may not be corrected upon review.

## 2017-03-26 NOTE — Telephone Encounter (Signed)
Scheduled appt per 8/7 los - Gave patient AVS and calender per los. - Ct to be scheduled by central radiology .

## 2017-03-28 ENCOUNTER — Encounter: Payer: Self-pay | Admitting: *Deleted

## 2017-03-28 NOTE — Progress Notes (Signed)
Oncology Nurse Navigator Documentation  Oncology Nurse Navigator Flowsheets 03/28/2017  Navigator Location [facility]  Navigator Encounter Type Other/I followed up on Valerie [name]'s scan authorization.  They have not been authorized yet.  I contacted authorization coordinator to help expedite.   Treatment Phase Treatment  Barriers/Navigation Needs Coordination of Care  Interventions Coordination of Care  Coordination of Care Other  Acuity Level 1  Time Spent with Patient 30

## 2017-04-02 ENCOUNTER — Ambulatory Visit (HOSPITAL_COMMUNITY)
Admission: RE | Admit: 2017-04-02 | Discharge: 2017-04-02 | Disposition: A | Discharge status: Home / Self Care | Payer: Medicare Other | Source: Ambulatory Visit | Attending: Internal Medicine | Admitting: Internal Medicine

## 2017-04-02 DIAGNOSIS — Z85850 Personal history of malignant neoplasm of thyroid: Secondary | ICD-10-CM | POA: Insufficient documentation

## 2017-04-02 DIAGNOSIS — Z5111 Encounter for antineoplastic chemotherapy: Secondary | ICD-10-CM | POA: Insufficient documentation

## 2017-04-02 DIAGNOSIS — C3411 Malignant neoplasm of upper lobe, right bronchus or lung: Secondary | ICD-10-CM | POA: Diagnosis present

## 2017-04-02 DIAGNOSIS — J90 Pleural effusion, not elsewhere classified: Secondary | ICD-10-CM | POA: Diagnosis not present

## 2017-04-02 MED ORDER — IOPAMIDOL (ISOVUE-300) INJECTION 61%
30.0000 mL | Freq: Once | INTRAVENOUS | Status: DC | PRN
  Administered 2017-04-02: 30 mL via ORAL
  Filled 2017-04-02: qty 30

## 2017-04-02 MED ORDER — IOPAMIDOL (ISOVUE-300) INJECTION 61%
INTRAVENOUS | Status: AC
  Filled 2017-04-02: qty 30

## 2017-04-02 MED ORDER — IOPAMIDOL (ISOVUE-300) INJECTION 61%
INTRAVENOUS | Status: AC
  Administered 2017-04-02: 100 mL
  Filled 2017-04-02: qty 100

## 2017-04-03 ENCOUNTER — Telehealth: Payer: Self-pay

## 2017-04-03 ENCOUNTER — Encounter: Payer: Self-pay | Admitting: Internal Medicine

## 2017-04-03 ENCOUNTER — Ambulatory Visit (HOSPITAL_BASED_OUTPATIENT_CLINIC_OR_DEPARTMENT_OTHER): Payer: Medicare Other | Admitting: Internal Medicine

## 2017-04-03 VITALS — BP 125/65 | HR 94 | Temp 98.6°F | Resp 18 | Ht 67.0 in | Wt 176.8 lb

## 2017-04-03 DIAGNOSIS — C7931 Secondary malignant neoplasm of brain: Secondary | ICD-10-CM | POA: Diagnosis not present

## 2017-04-03 DIAGNOSIS — C3411 Malignant neoplasm of upper lobe, right bronchus or lung: Secondary | ICD-10-CM | POA: Diagnosis not present

## 2017-04-03 DIAGNOSIS — Z5111 Encounter for antineoplastic chemotherapy: Secondary | ICD-10-CM

## 2017-04-03 DIAGNOSIS — K529 Noninfective gastroenteritis and colitis, unspecified: Secondary | ICD-10-CM | POA: Diagnosis not present

## 2017-04-03 DIAGNOSIS — C719 Malignant neoplasm of brain, unspecified: Secondary | ICD-10-CM

## 2017-04-03 NOTE — Telephone Encounter (Signed)
appts made and avs printed for patient per 8/15 los  ann

## 2017-04-03 NOTE — Progress Notes (Signed)
[place] [phone]   [phone]  OFFICE PROGRESS NOTE  Valerie [name], [address] [place] [address]  PRINCIPAL DIAGNOSIS:  metastatic non-small cell lung cancer, adenocarcinoma with brain metastases initially diagnosed as a stage IIb in October 2002.   BIOMARKERS TESTING ([facility]): Positive for: EGFR [id number] del, T790M, CTNNB1 D32G, SMAD4 T117f*10 Negative for: RET, ALK, BRAF, KRAS, ERBB2 and MET  PRIOR THERAPY:  1. Status post left lower lobectomy under the care of Dr BArlyce Dicein October 2002. 2. Status post course of concurrent chemoradiation with weekly carboplatin and paclitaxel under the care of Dr SBenay Spiceand Dr WElba Barmanin early 2003. 3. Status post treatment [facility] 4. Status post left occipital craniotomy for tumor resection on September 21, 2005, under the care of Dr CChristella Noa 5. Status post whole brain irradiation under the care of Dr WElba Barman completed November 07, 2005. 6. Status post gamma knife [facility] the [facility]. Status post right thyroidectomy with radical neck dissection under the care of Dr. RConstance Holsteron 10/18/2011. 8. Status post excisional biopsy of right cervical lymph node that was consistent with metastatic adenocarcinoma. 9. Tarceva 150 mg by mouth daily with therapy beginning 06/03/2007, discontinued in July 2015 secondary to disease progression. Status post 79 cycles. 10. palliative radiotherapy to the enlarging right upper lobe lung nodule under the care of Dr. MValere Dross [facility]. [id number] 300 mg by mouth daily for 21 days discontinued secondary to study deviation and [facility]. 12. Open reduction and  internal fixation of trimalleolar ankle fracture dislocation with fixation of the fibula as well as medial          malleolus.   CURRENT THERAPY: Tagrisso 80 mg by mouth daily started 11/25/2014, status post 27 months of treatment.  INTERVAL HISTORY: [name]71y.o. female returns to the clinic today for follow-up visit accompanied by her husband. The patient is feeling fine today with no specific complaints except for diarrhea after drinking the oral contrast for the CT scan today. She denied having any chest pain, shortness of breath, cough or hemoptysis. She denied having any recent weight loss or night sweats. She has no skin rash. She denied having any fever or chills. She has no nausea or vomiting. The patient had repeat CT scan of the neck, chest, abdomen and pelvis performed recently and she is here for evaluation and discussion of her scan results.  MEDICAL HISTORY: Past Medical History:  Diagnosis Date  . Abnormal Pap smear 2006  . Anal fistula   . Ankle fracture   . Anxiety   . Arthritis   . Atrophic vaginitis 2008  . Cataract   . Dementia 2009  . Dyspareunia 2008  . H/O osteoporosis   . H/O varicella   . H/O vitamin D deficiency   . Headache 07/24/2016  . Heart murmur   . History of measles, mumps, or rubella   . History of radiation therapy 07/28/13- 08/10/13   right lung metastasis 5000 cGy 10 sessions  . Hypertension   . [place] [place]  . [place]   . [place]   . Lung  metastasis ([place])    PET scan 05/05/13, RUL lung nodule  . Metastasis to brain [facility]) dx'd 2008  . 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 Nodule of right lung CT- 06/03/12   RIGHT UPPER LOBE  . Nodule of right lung 06/03/12   Upper Lobe  . On antineoplastic chemotherapy    TARCEVA  . Osteoporosis 2010  . Primary cancer of right upper lobe of lung ([place]) 04/22/2009   Qualifier: Diagnosis of  By: Nils Pyle CMA ([place]), Mearl Latin    . Shortness of breath     hx lung ca   . Status post chemotherapy 2003   CARBOPLATIN/PACLITAXEL /STATUS POST CLINICAL TRAIL OF CELEBREX AND IRESSA AT [facility] FOR 1 YEAR  . Status post radiation therapy 2003   LEFT LUNG  . Status post radiation therapy 11/07/2005   WHOLE BRAIN: DR Larkin In[facility]  . Status post radiation therapy 06/02/2008   GAMMA KNIFE OF RESECTED CAVITAY  . Thyroid adenoma    ?  [name] Thyroid cancer ([place]) 10/18/11 bx   adenoid nodules   . Yeast infection     ALLERGIES:  has No Known Allergies.  MEDICATIONS:  Current Outpatient Prescriptions  Medication Sig Dispense Refill  . alendronate (FOSAMAX) 70 MG tablet Take 1 tablet (70 mg total) by mouth every 7 (seven) days. Saturdays; Take with a full glass of water on an empty stomach. (Patient not taking: Reported on 03/26/2017) 4 tablet 12  . Ascorbic Acid (VITAMIN C) 100 MG tablet Take 100 mg by mouth daily.    . cholecalciferol (VITAMIN D) 1000 UNITS tablet Take 1,000 Units by mouth at bedtime.    . donepezil (ARICEPT) 10 MG tablet Take 10 mg by mouth daily.  0  . loperamide (IMODIUM) 2 MG capsule Take 2 mg by mouth as needed for diarrhea or loose stools.     . Multiple Vitamin (MULTI-VITAMINS) TABS Take 1 tablet by mouth daily.     . Osimertinib Mesylate (TAGRISSO PO)   2  . TAGRISSO 80 MG tablet TAKE 1 TABLET BY MOUTH DAILY. 30 tablet 2  . XARELTO 20 MG TABS tablet      No current facility-administered medications for this visit.     SURGICAL HISTORY:  Past Surgical History:  Procedure Laterality Date  . BRAIN SURGERY    . LEFT LOWER LOBECTOMY  05/2001  . LEFT OCCIPITAL CRANIOTOMY  09/21/2005   tumor  . LUNG LOBECTOMY    . NECK SURGERY    . ORIF ANKLE FRACTURE Left 10/02/2014   Procedure: OPEN REDUCTION INTERNAL FIXATION (ORIF) ANKLE FRACTURE;  Surgeon: Alta Corning, MD;  Location: [facility];  Service: Orthopedics;  Laterality: Left;  . RADICAL NECK DISSECTION  10/18/2011   Procedure: RADICAL NECK DISSECTION;  Surgeon: Izora Gala, MD;   Location: [place];  Service: ENT;  Laterality: Right;  RIGHT MODIFIED NECK DISSECTION /POSSIBLE RIGHT THYROIDECTOM  . THYROIDECTOMY  10/18/2011   Procedure: THYROIDECTOMY WITH RADICAL NECK DISSECTION;  Surgeon: Izora Gala, MD;  Location: [place];  Service: ENT;  Laterality: Right;    REVIEW OF SYSTEMS:  Constitutional: positive for fatigue Eyes: negative Ears, nose, mouth, throat, and face: negative Respiratory: negative Cardiovascular: negative Gastrointestinal: positive for diarrhea Genitourinary:negative Integument/breast: negative Hematologic/lymphatic: negative Musculoskeletal:negative Neurological: negative Behavioral/Psych: negative Endocrine: negative Allergic/Immunologic: negative   PHYSICAL EXAMINATION: General appearance: alert, cooperative, fatigued and no distress Head: Normocephalic, without obvious abnormality, atraumatic Neck: no adenopathy, supple, symmetrical, trachea midline and thyroid not enlarged, symmetric, no tenderness/mass/nodules Lymph  nodes: Cervical, supraclavicular, and axillary nodes normal. Resp: clear to auscultation bilaterally Back: symmetric, no curvature. ROM normal. No CVA tenderness. Cardio: regular rate and rhythm, S1, S2 normal, no murmur, click, rub or gallop GI: soft, non-tender; bowel sounds normal; no masses,  no organomegaly Extremities: extremities normal, atraumatic, no cyanosis or edema Neurologic: Alert and oriented X 3, normal strength and tone. Normal symmetric reflexes. Normal coordination and gait  ECOG PERFORMANCE STATUS: 1 - Symptomatic but completely ambulatory  Blood pressure 125/65, pulse 94, temperature 98.6 F (37 C), temperature source Axillary, resp. rate 18, height '5\' 7"'$  (1.702 m), weight 176 lb 12.8 oz (80.2 kg), SpO2 100 %.  LABORATORY DATA: Lab Results  Component Value Date   WBC 6.1 03/26/2017   HGB 13.5 03/26/2017   HCT 41.0 03/26/2017   MCV 92.2 03/26/2017   PLT 183 03/26/2017      Chemistry        Component Value Date/Time   NA 141 03/26/2017 1013   K 3.8 03/26/2017 1013   CL 102 08/29/2015 0505   CL 106 12/09/2012 0806   CO2 26 03/26/2017 1013   BUN 15.6 03/26/2017 1013   CREATININE 1.0 03/26/2017 1013      Component Value Date/Time   CALCIUM 10.0 03/26/2017 1013   ALKPHOS 61 03/26/2017 1013   AST 13 03/26/2017 1013   ALT 11 03/26/2017 1013   BILITOT 0.63 03/26/2017 1013       RADIOGRAPHIC STUDIES: Ct Soft Tissue Neck W Contrast  Result Date: 04/03/2017 CLINICAL DATA:  Primary cancer right upper lobe lung. History of thyroid cancer, brain cancer EXAM: CT NECK WITH CONTRAST TECHNIQUE: Multidetector CT imaging of the neck was performed using the standard protocol following the bolus administration of intravenous contrast. CONTRAST:  112m ISOVUE-300 IOPAMIDOL (ISOVUE-300) INJECTION 61% COMPARISON:  CT neck 03/29/2016 FINDINGS: Pharynx and larynx: Normal. No mass or swelling. Salivary glands: Atrophic changes right parotid gland similar to the prior study. Left parotid gland normal. Submandibular glands normal Thyroid: Right hemithyroidectomy. Small 5 mm nodules left thyroid unchanged. Lymph nodes: No enlarged or pathologic lymph nodes in the neck. Vascular: Mild atherosclerotic calcification carotid bifurcation bilaterally. Limited intracranial: Left occipital craniotomy. Negative for mass lesion. Visualized orbits: Not imaged Mastoids and visualized paranasal sinuses: Mucosal edema base of right maxillary sinus. Remaining sinuses clear Skeleton: Negative Upper chest: CT chest findings reported separately from today Other: None IMPRESSION: Negative for metastatic disease to the neck. Status post right hemithyroidectomy for thyroid cancer. Status post left occipital craniotomy. Electronically Signed   By: CFranchot GalloM.D.   On: 04/03/2017 07:19   Ct Chest W Contrast  Result Date: 04/03/2017 CLINICAL DATA:  Right lung cancer in 2002 with brain cancer in 2007. Thyroid cancer in  2007. Chemotherapy and radiation therapy completed. Thyroidectomy. Asymptomatic. Right upper lobe lung primary. Stage IV non-small-cell lung cancer. EXAM: CT CHEST, ABDOMEN, AND PELVIS WITH CONTRAST TECHNIQUE: Multidetector CT imaging of the chest, abdomen and pelvis was performed following the standard protocol during bolus administration of intravenous contrast. CONTRAST:  1032mISOVUE-300 IOPAMIDOL (ISOVUE-300) INJECTION 61% COMPARISON:  07/20/2016. FINDINGS: CT CHEST FINDINGS Cardiovascular: Aortic atherosclerosis. Tortuous thoracic aorta. Mild cardiomegaly, without pericardial effusion. No central pulmonary embolism, on this non-dedicated study. Mediastinum/Nodes: Right-sided thyroidectomy. No mediastinal or hilar adenopathy. Dilated esophagus with fluid level within. Lungs/Pleura: Small left pleural effusion is not significantly changed. Status post left lower lobectomy. Peribronchovascular interstitial thickening in the central right upper lobe is similar, including on image 46/series 5 4 mm  right upper lobe pulmonary nodule is unchanged on image 43/ series 5. A 3 mm right lower lobe pulmonary nodule on image 91/series 5 is likely similar to on the prior. Central left upper lobe 5 mm nodule on image 69/series 5 is likely present on the prior. Left paramediastinal radiation fibrosis is again identified 6 mm left upper lobe pulmonary nodule on image 40/ series 5 is unchanged. Left hemidiaphragm elevation. Musculoskeletal: Left-sided thoracotomy defect. Irregularity involving the superior aspect of the sternal body is favored to be posttraumatic. This is unchanged. CT ABDOMEN PELVIS FINDINGS Hepatobiliary: Normal liver. Normal gallbladder, without biliary ductal dilatation. Pancreas: Mild pancreatic atrophy with borderline duct dilatation within the head. This is similar and no cause is identified. Spleen: Normal in size, without focal abnormality. Adrenals/Urinary Tract: Normal adrenal glands. Mild renal  cortical thinning bilaterally. 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 No hydronephrosis. Trace air in the nondependent bladder. No bladder wall thickening. Stomach/Bowel: Normal stomach, without wall thickening. Scattered colonic diverticula. Normal terminal ileum and appendix. Normal small bowel. Vascular/Lymphatic: Abdominal aortic atherosclerosis. No abdominopelvic adenopathy. Reproductive: Heterogeneous appearance of the uterus is similar. No adnexal mass. Other: No significant free fluid. No evidence of omental or peritoneal disease. Musculoskeletal: Mild osteopenia. Similar mild to moderate L4 compression deformity. IMPRESSION: 1. Surgical changes of left lower lobectomy. 2. Similar bilateral pulmonary nodules with radiation fibrosis. No findings to suggest Progressive disease. 3. Air within the urinary bladder could be iatrogenic. Correlate with instrumentation. 4. Heterogeneous uterus, likely related to a small underlying fibroids. 5. Similar small left pleural effusion. 6. Esophageal air fluid level suggests dysmotility or gastroesophageal reflux. Electronically Signed   By: Abigail Miyamoto M.D.   On: 04/03/2017 09:26   Ct Abdomen Pelvis W Contrast  Result Date: 04/03/2017 CLINICAL DATA:  Right lung cancer in 2002 with brain cancer in 2007. Thyroid cancer in 2007. Chemotherapy and radiation therapy completed. Thyroidectomy. Asymptomatic. Right upper lobe lung primary. Stage IV non-small-cell lung cancer. EXAM: CT CHEST, ABDOMEN, AND PELVIS WITH CONTRAST TECHNIQUE: Multidetector CT imaging of the chest, abdomen and pelvis was performed following the standard protocol during bolus administration of intravenous contrast. CONTRAST:  17m ISOVUE-300 IOPAMIDOL (ISOVUE-300) INJECTION 61% COMPARISON:  07/20/2016. FINDINGS: CT CHEST FINDINGS Cardiovascular: Aortic atherosclerosis. Tortuous thoracic aorta. Mild cardiomegaly, without pericardial effusion. No central pulmonary embolism, on this  non-dedicated study. Mediastinum/Nodes: Right-sided thyroidectomy. No mediastinal or hilar adenopathy. Dilated esophagus with fluid level within. Lungs/Pleura: Small left pleural effusion is not significantly changed. Status post left lower lobectomy. Peribronchovascular interstitial thickening in the central right upper lobe is similar, including on image 46/series 5 4 mm right upper lobe pulmonary nodule is unchanged on image 43/ series 5. A 3 mm right lower lobe pulmonary nodule on image 91/series 5 is likely similar to on the prior. Central left upper lobe 5 mm nodule on image 69/series 5 is likely present on the prior. Left paramediastinal radiation fibrosis is again identified 6 mm left upper lobe pulmonary nodule on image 40/ series 5 is unchanged. Left hemidiaphragm elevation. Musculoskeletal: Left-sided thoracotomy defect. Irregularity involving the superior aspect of the sternal body is favored to be posttraumatic. This is unchanged. CT ABDOMEN PELVIS FINDINGS Hepatobiliary: Normal liver. Normal gallbladder, without biliary ductal dilatation. Pancreas: Mild pancreatic atrophy with borderline duct dilatation within the head. This is similar and no cause is identified. Spleen: Normal in size, without focal abnormality. Adrenals/Urinary Tract: Normal adrenal glands. Mild renal cortical thinning bilaterally. 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  No hydronephrosis. Trace air in the nondependent bladder. No bladder wall thickening. Stomach/Bowel: Normal stomach, without wall thickening. Scattered colonic diverticula. Normal terminal ileum and appendix. Normal small bowel. Vascular/Lymphatic: Abdominal aortic atherosclerosis. No abdominopelvic adenopathy. Reproductive: Heterogeneous appearance of the uterus is similar. No adnexal mass. Other: No significant free fluid. No evidence of omental or peritoneal disease. Musculoskeletal: Mild osteopenia. Similar mild to moderate L4 compression  deformity. IMPRESSION: 1. Surgical changes of left lower lobectomy. 2. Similar bilateral pulmonary nodules with radiation fibrosis. No findings to suggest Progressive disease. 3. Air within the urinary bladder could be iatrogenic. Correlate with instrumentation. 4. Heterogeneous uterus, likely related to a small underlying fibroids. 5. Similar small left pleural effusion. 6. Esophageal air fluid level suggests dysmotility or gastroesophageal reflux. Electronically Signed   By: Abigail Miyamoto M.D.   On: 04/03/2017 09:26    ASSESSMENT AND PLAN: This is a very pleasant 71 years old African-American female with metastatic non-small cell lung cancer, adenocarcinoma with positive EGFR mutation status post several years of treatment with Tarceva and the patient developed positive resistant mutation, T790M. The patient is currently on treatment with Tagrisso status post 27 months. She has been tolerating the treatment well with no significant adverse effects. The patient had repeat CT scan of the neck, chest, abdomen and pelvis performed earlier today. I personally and independently reviewed the scans and discussing results with the patient and her husband. Fortunately her scan showed no evidence for disease progression. I recommended for her to continue on her current treatment with Tagrisso 80 mg by mouth daily. I will see her back for follow-up visit in 2 months for reevaluation and repeat blood work. For the diarrhea, I recommended for the patient to continue on Imodium an as-needed basis. She was advised to call immediately if she has any concerning symptoms in the interval. The patient voices understanding of current disease status and treatment options and is in agreement with the current care plan. All questions were answered. The patient knows to call the clinic with any problems, questions or concerns. We can certainly see the patient much sooner if necessary.  Disclaimer: This note was dictated with  voice recognition software. Similar sounding words can inadvertently be transcribed and may not be corrected upon review.

## 2017-04-10 MED FILL — TAGRISSO 80 MG TABLET: 80 | 30 days supply | Qty: 30 | Fill #2

## 2017-05-01 ENCOUNTER — Other Ambulatory Visit: Payer: Self-pay | Admitting: Internal Medicine

## 2017-05-01 DIAGNOSIS — C3411 Malignant neoplasm of upper lobe, right bronchus or lung: Secondary | ICD-10-CM

## 2017-05-02 ENCOUNTER — Other Ambulatory Visit: Payer: Self-pay | Admitting: Internal Medicine

## 2017-05-02 DIAGNOSIS — C3411 Malignant neoplasm of upper lobe, right bronchus or lung: Secondary | ICD-10-CM

## 2017-05-03 ENCOUNTER — Other Ambulatory Visit: Payer: Self-pay | Admitting: Internal Medicine

## 2017-05-03 DIAGNOSIS — C3411 Malignant neoplasm of upper lobe, right bronchus or lung: Secondary | ICD-10-CM

## 2017-05-03 MED FILL — TAGRISSO 80 MG TABLET: 80 | 30 days supply | Qty: 30 | Fill #0

## 2017-05-15 ENCOUNTER — Ambulatory Visit
Admission: RE | Admit: 2017-05-15 | Discharge: 2017-05-15 | Disposition: A | Discharge status: Home / Self Care | Payer: Medicare Other | Source: Ambulatory Visit | Attending: Nurse Practitioner | Admitting: Nurse Practitioner

## 2017-05-15 ENCOUNTER — Other Ambulatory Visit: Payer: Self-pay | Admitting: Nurse Practitioner

## 2017-05-15 DIAGNOSIS — M545 Low back pain, unspecified: Secondary | ICD-10-CM

## 2017-05-20 IMAGING — CT CT CHEST W/ CM
3 of 10 series · 10 of 46 positions shown, 17 images · IV contrast (omnipaque)
Comparison: CT 02/15/2015 and 11/15/2014.

CLINICAL DATA: Non-small-cell lung cancer metastatic to the brain.
First diagnosed in 4334. Thyroid cancer diagnosed in 7327. Oral
chemotherapy in progress. Subsequent encounter.

EXAM:
CT CHEST, ABDOMEN, AND PELVIS WITH CONTRAST
TECHNIQUE: Multidetector CT imaging of the chest, abdomen and pelvis was
performed following the standard protocol during bolus
administration of intravenous contrast.
CONTRAST:  100mL OMNIPAQUE IOHEXOL 300 MG/ML  SOLN

[Series 2: cap with st · axial · 0.79mm/px · z∈[-761,-286]mm · 6 of 133 slices shown, 11 images]
[im 19/133  soft-tissue]
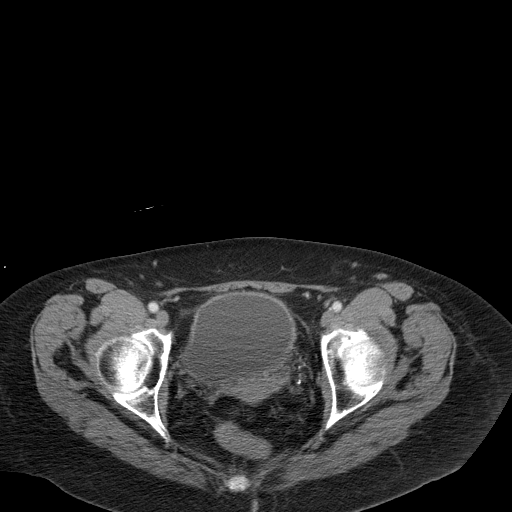
[im 19/133  bone]
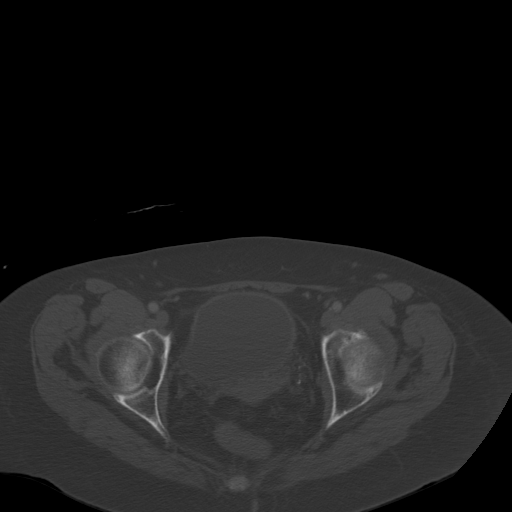
[im 38/133  soft-tissue]
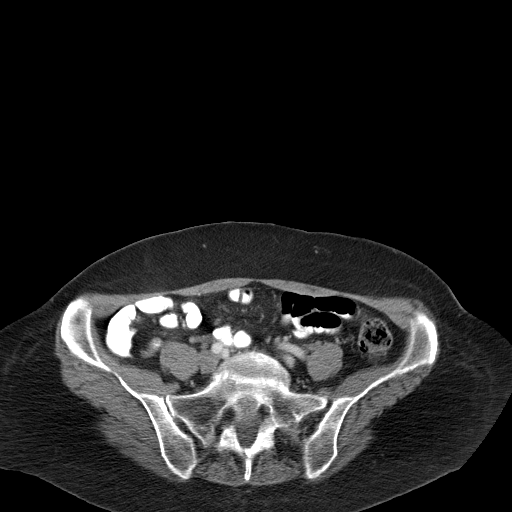
[im 57/133  soft-tissue]
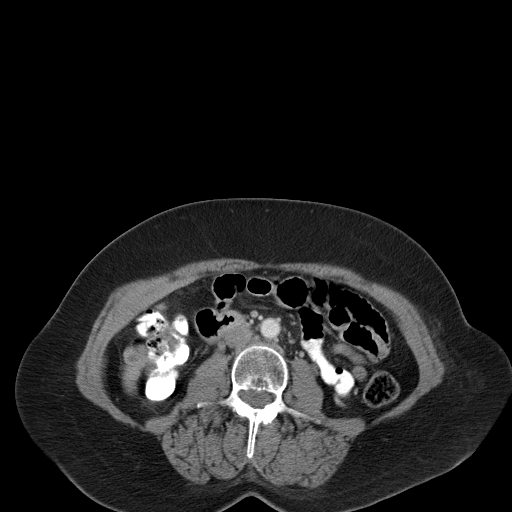
[im 57/133  lung]
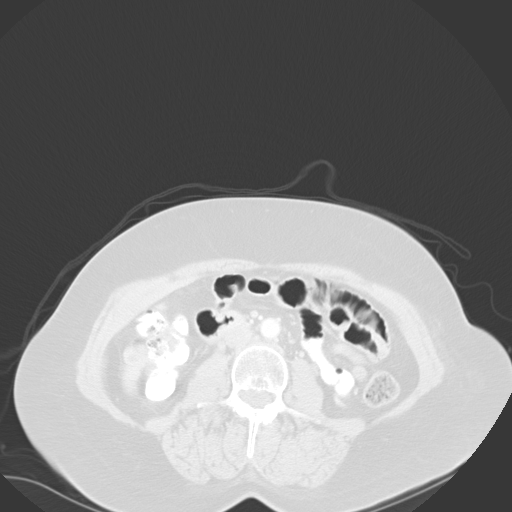
[im 76/133  soft-tissue]
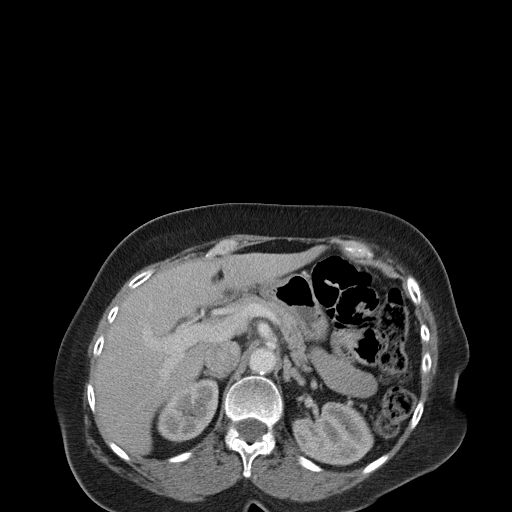
[im 76/133  lung]
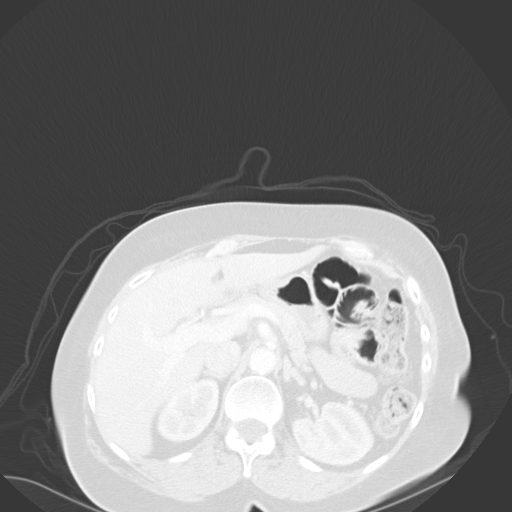
[im 95/133  soft-tissue]
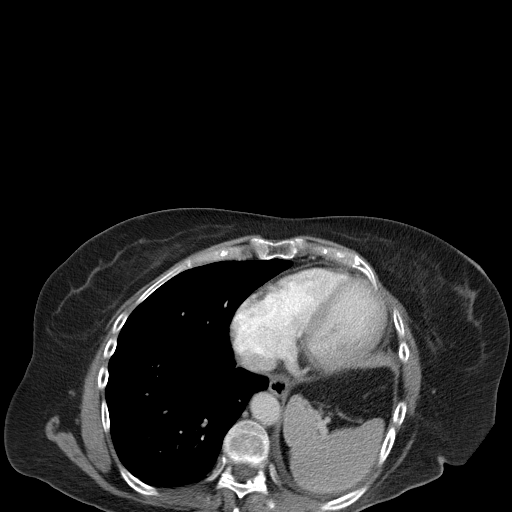
[im 95/133  lung]
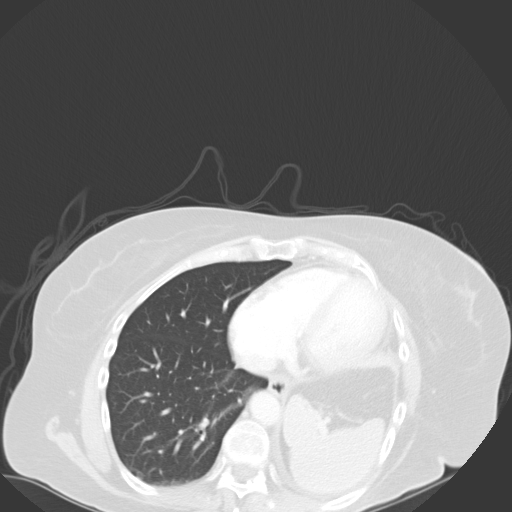
[im 114/133  soft-tissue]
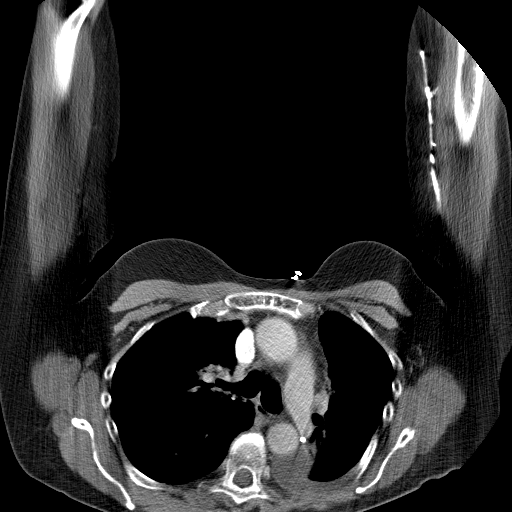
[im 114/133  lung]
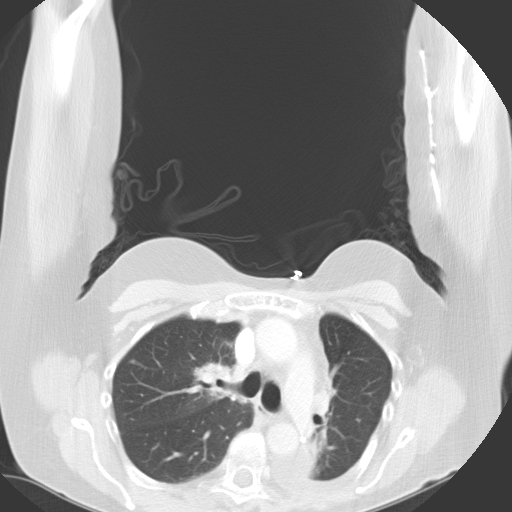

[Series 603: cap sag · sagittal · 1.29mm/px · 1 of 110 slices shown, 2 images]
[im 55/110  soft-tissue]
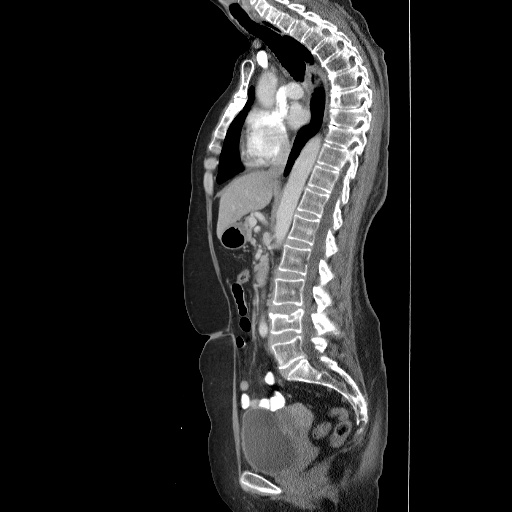
[im 55/110  bone]
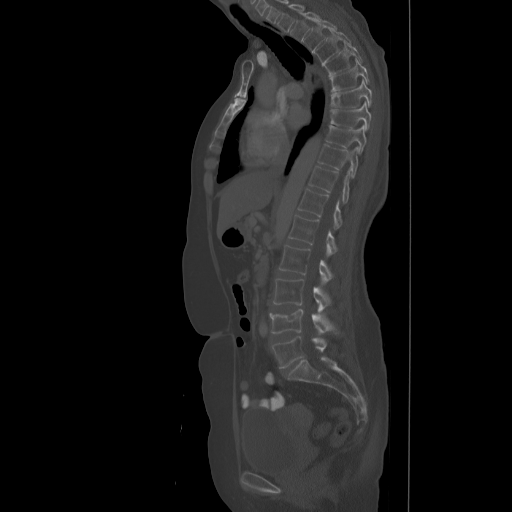

[Series 605: cor · coronal · 0.45mm/px · 3 of 72 slices shown, 4 images]
[im 18/72  soft-tissue]
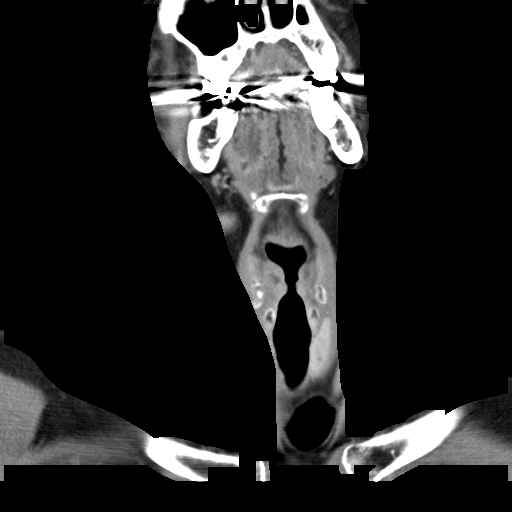
[im 36/72  soft-tissue]
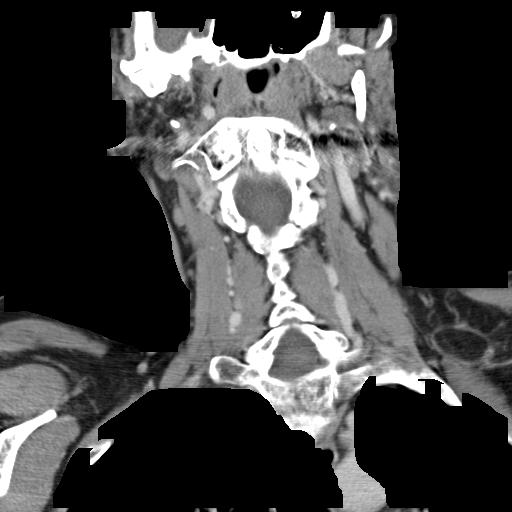
[im 36/72  bone]
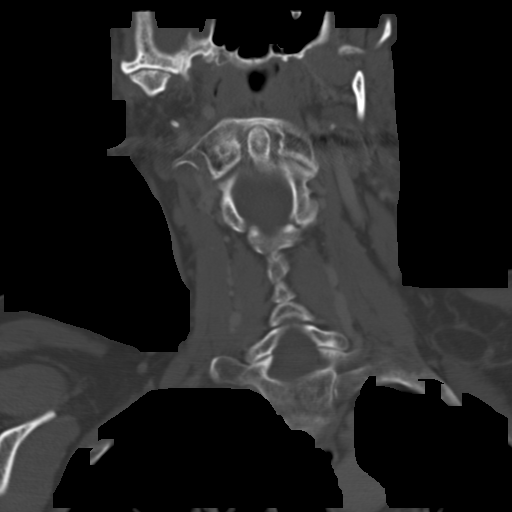
[im 54/72  soft-tissue]
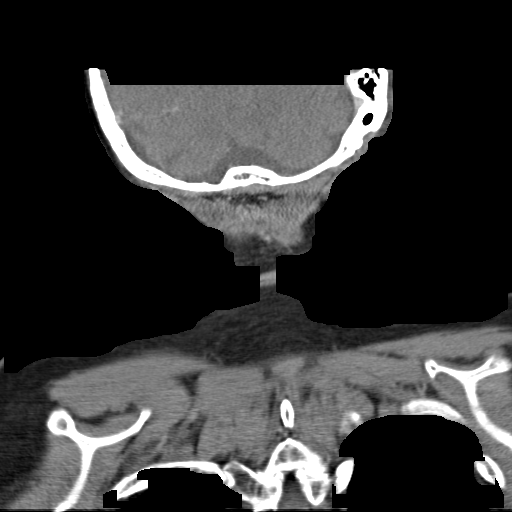

[10 of 46 positions shown; findings below may reference images not displayed]

FINDINGS: CT CHEST FINDINGS

Mediastinum/Nodes: There are no enlarged mediastinal, hilar or
axillary lymph nodes. The right thyroid lobe appears to be
surgically absent. The left thyroid lobe and esophagus demonstrate
no significant findings. The heart size is normal. There is no
pericardial effusion. There are no significant vascular findings.

Lungs/Pleura: Small dependent left pleural effusion appears
unchanged.There is stable volume loss and paramediastinal radiation
change in the left hemithorax status post left lower lobe resection.
Paramediastinal radiation changes in the right upper lobe are also
stable with hilar distortion and narrowing of the bronchus. There
are stable scattered tiny pulmonary nodules bilaterally. No new or
enlarging nodules identified.

Musculoskeletal/Chest wall: No chest wall mass or suspicious osseous
findings. Thoracic kyphosis and degenerative changes are noted.
There is a stable deformity of the mid sternum from previous
fracture.

CT ABDOMEN AND PELVIS FINDINGS

Hepatobiliary: No suspicious hepatic findings. There is stable tiny
low-density lesion in the left lobe on image 53. No evidence of
gallstones, gallbladder wall thickening or biliary dilatation.

Pancreas: Unremarkable. No pancreatic ductal dilatation or
surrounding inflammatory changes.

Spleen: Normal in size without focal abnormality.

Adrenals/Urinary Tract: Both adrenal glands appear normal. There is
no evidence of renal mass, hydronephrosis or urinary tract calculus.
Duplication of the left collecting system is again noted. The
bladder appears unchanged.

Stomach/Bowel: No evidence of bowel wall thickening, distention or
surrounding inflammatory change. The appendix appears normal.

Vascular/Lymphatic: There are no enlarged abdominal or pelvic lymph
nodes. There is a stable small right renal artery aneurysm. No other
significant vascular findings.

Reproductive: Stable posterior uterine fibroid, best seen on the
sagittal images. No adnexal mass.

Other: No evidence of abdominal wall mass or hernia.

Musculoskeletal: No acute or significant osseous findings. Stable
chronic superior endplate compression deformity at L4.
IMPRESSION: 1. Stable postsurgical and post radiation changes in the chest. No
evidence of local recurrence or metastatic disease.
2. Stable small left pleural effusion and scattered small pulmonary
nodules.
3. No acute or suspicious findings in the abdomen or pelvis.
4. Resolved left-sided caliectasis. Underlying duplication of the
left renal collecting system again noted.
5. Stable incidental findings including atherosclerosis, uterine
fibroid and chronic fractures.

## 2017-06-04 ENCOUNTER — Telehealth: Payer: Self-pay | Admitting: Internal Medicine

## 2017-06-04 ENCOUNTER — Other Ambulatory Visit (HOSPITAL_BASED_OUTPATIENT_CLINIC_OR_DEPARTMENT_OTHER): Payer: Medicare Other

## 2017-06-04 ENCOUNTER — Ambulatory Visit (HOSPITAL_BASED_OUTPATIENT_CLINIC_OR_DEPARTMENT_OTHER): Payer: Medicare Other | Admitting: Internal Medicine

## 2017-06-04 ENCOUNTER — Encounter: Payer: Self-pay | Admitting: Internal Medicine

## 2017-06-04 VITALS — BP 121/70 | HR 80 | Temp 98.0°F | Resp 18 | Ht 67.0 in | Wt 178.5 lb

## 2017-06-04 DIAGNOSIS — C3411 Malignant neoplasm of upper lobe, right bronchus or lung: Secondary | ICD-10-CM | POA: Diagnosis not present

## 2017-06-04 DIAGNOSIS — C7931 Secondary malignant neoplasm of brain: Secondary | ICD-10-CM | POA: Diagnosis not present

## 2017-06-04 DIAGNOSIS — R5383 Other fatigue: Secondary | ICD-10-CM

## 2017-06-04 DIAGNOSIS — K529 Noninfective gastroenteritis and colitis, unspecified: Secondary | ICD-10-CM

## 2017-06-04 DIAGNOSIS — Z5111 Encounter for antineoplastic chemotherapy: Secondary | ICD-10-CM

## 2017-06-04 DIAGNOSIS — C719 Malignant neoplasm of brain, unspecified: Secondary | ICD-10-CM

## 2017-06-04 LAB — CBC WITH DIFFERENTIAL/PLATELET
BASO%: 0.2 % (ref 0.0–2.0)
Basophils Absolute: 0 10*3/uL (ref 0.0–0.1)
EOS%: 1.1 % (ref 0.0–7.0)
Eosinophils Absolute: 0.1 10*3/uL (ref 0.0–0.5)
HCT: 38.8 % (ref 34.8–46.6)
HGB: 12.2 g/dL (ref 11.6–15.9)
LYMPH%: 11.6 % — ABNORMAL LOW (ref 14.0–49.7)
MCH: 29.5 pg (ref 25.1–34.0)
MCHC: 31.4 g/dL — ABNORMAL LOW (ref 31.5–36.0)
MCV: 93.7 fL (ref 79.5–101.0)
MONO#: 0.5 10*3/uL (ref 0.1–0.9)
MONO%: 10.1 % (ref 0.0–14.0)
NEUT#: 3.5 10*3/uL (ref 1.5–6.5)
NEUT%: 77 % — ABNORMAL HIGH (ref 38.4–76.8)
Platelets: 201 10*3/uL (ref 145–400)
RBC: 4.14 10*6/uL (ref 3.70–5.45)
RDW: 13.8 % (ref 11.2–14.5)
WBC: 4.6 10*3/uL (ref 3.9–10.3)
lymph#: 0.5 10*3/uL — ABNORMAL LOW (ref 0.9–3.3)

## 2017-06-04 LAB — COMPREHENSIVE METABOLIC PANEL
ALT: 11 U/L (ref 0–55)
AST: 12 U/L (ref 5–34)
Albumin: 3.9 g/dL (ref 3.5–5.0)
Alkaline Phosphatase: 107 U/L (ref 40–150)
Anion Gap: 9 mEq/L (ref 3–11)
BUN: 14.9 mg/dL (ref 7.0–26.0)
CO2: 25 mEq/L (ref 22–29)
Calcium: 9.8 mg/dL (ref 8.4–10.4)
Chloride: 107 mEq/L (ref 98–109)
Creatinine: 1 mg/dL (ref 0.6–1.1)
EGFR: >60 mL/min/{1.73_m2} (ref >60)
Glucose: 96 mg/dl (ref 70–140)
Potassium: 4.3 mEq/L (ref 3.5–5.1)
Sodium: 141 mEq/L (ref 136–145)
Total Bilirubin: 0.51 mg/dL (ref 0.20–1.20)
Total Protein: 6.7 g/dL (ref 6.4–8.3)

## 2017-06-04 NOTE — Telephone Encounter (Signed)
Scheduled appt per 10/16 los - Gave patient AVS and calender per los.  

## 2017-06-04 NOTE — Progress Notes (Signed)
[place] [phone]   Fax:(336) [phone]  OFFICE PROGRESS NOTE  [name], [address] [place] [address]  PRINCIPAL DIAGNOSIS:  metastatic non-small cell lung cancer, adenocarcinoma with brain metastases initially diagnosed as a stage IIb in October 2002.   BIOMARKERS TESTING ([facility] One): Positive for: EGFR E746-A750 del, T790M, CTNNB1 D32G, SMAD4 T135f*10 Negative for: RET, ALK, BRAF, KRAS, ERBB2 and MET  PRIOR THERAPY:  1. Status post left lower lobectomy under the care of Dr BArlyce Dicein October 2002. 2. Status post course of concurrent chemoradiation with weekly carboplatin and paclitaxel under the care of Dr SBenay Spiceand Dr WElba Barmanin early 2003. 3. Status post [facility] 4. Status post left occipital craniotomy for tumor resection on September 21, 2005, under the care of Dr CChristella Noa 5. Status post whole brain irradiation under the care of Dr WElba Barman completed November 07, 2005. 6. Status post gamma knife treatment [facility] the [facility]. Status post right thyroidectomy with radical neck dissection under the care of Dr. RConstance Holsteron 10/18/2011. 8. Status post excisional biopsy of right cervical lymph node that was consistent with metastatic adenocarcinoma. 9. Tarceva 150 mg by mouth daily with therapy beginning 06/03/2007, discontinued in July 2015 secondary to disease progression. Status post 79 cycles. 10. palliative radiotherapy to the enlarging right upper lobe lung nodule under the care of Dr. MValere Dross [facility]. [id number] 300 mg by mouth daily for 21 days discontinued secondary to study deviation and [facility]. 12. Open reduction and  internal fixation of trimalleolar ankle fracture dislocation with fixation of the fibula as well as medial          malleolus.   CURRENT THERAPY: Tagrisso 80 mg by mouth daily started 11/25/2014, status post 30 months of treatment.  INTERVAL HISTORY: Valerie LEVANDOSKI71y.o. female returns to the clinic today for follow-up visit accompanied by her husband. The patient has no complaints today except for fatigue. She has a recent fall when she was trying to pick up Cat food. She had x-ray of the lumbar spines that showed compression fracture of L1 and L4 of uncertain age. The patient denied having any current chest pain, shortness breath, cough or hemoptysis. She denied having any fever or chills. She has no nausea, vomiting, diarrhea or constipation. She has no significant skin rash. She is here today for evaluation and repeat blood work.   MEDICAL HISTORY: Past Medical History:  Diagnosis Date  . Abnormal Pap smear 2006  . Anal fistula   . Ankle fracture   . Anxiety   . Arthritis   . Atrophic vaginitis 2008  . Cataract   . Dementia 2009  . Dyspareunia 2008  . H/O osteoporosis   . H/O varicella   . H/O vitamin D deficiency   . Headache 07/24/2016  . Heart murmur   . History of measles, mumps, or rubella   . History of radiation therapy 07/28/13- 08/10/13   right lung metastasis 5000 cGy 10 sessions  . Hypertension   . [place]  . [place]   . [place] [place]   . [place] [place]  PET scan 05/05/13, RUL lung nodule  . Metastasis to brain [facility]) dx'd 2008  . Metastasis to lymph nodes (HCC) dx'd 09/2011  . Nodule of right lung CT- 06/03/12   RIGHT UPPER LOBE  . Nodule of right lung 06/03/12   Upper Lobe  . On antineoplastic chemotherapy    TARCEVA  . Osteoporosis 2010  . Primary cancer of right upper lobe of lung (HCC) 04/22/2009   Qualifier: Diagnosis of  By: Koleen Distance CMA (AAMA), Hulan Saas    . Shortness of breath    hx lung ca   . Status  post chemotherapy 2003   CARBOPLATIN/PACLITAXEL /STATUS POST CLINICAL TRAIL OF CELEBREX AND IRESSA AT [facility] FOR 1 YEAR  . Status post radiation therapy 2003   LEFT LUNG  . Status post radiation therapy 11/07/2005   WHOLE BRAIN: DR [facility]  . Status post radiation therapy 06/02/2008   GAMMA KNIFE OF RESECTED CAVITAY  . Thyroid adenoma    ?  [name] Thyroid cancer (HCC) 10/18/11 bx   adenoid nodules   . Yeast infection     ALLERGIES:  has No Known Allergies.  MEDICATIONS:  Current Outpatient Prescriptions  Medication Sig Dispense Refill  . acetaminophen-codeine (TYLENOL #3) 300-30 MG tablet     . alendronate (FOSAMAX) 70 MG tablet Take 1 tablet (70 mg total) by mouth every 7 (seven) days. Saturdays; Take with a full glass of water on an empty stomach. 4 tablet 12  . Ascorbic Acid (VITAMIN C) 100 MG tablet Take 100 mg by mouth daily.    . cholecalciferol (VITAMIN D) 1000 UNITS tablet Take 1,000 Units by mouth at bedtime.    . donepezil (ARICEPT) 10 MG tablet Take 10 mg by mouth daily.  0  . lidocaine (XYLOCAINE) 2 % jelly     . loperamide (IMODIUM) 2 MG capsule Take 2 mg by mouth as needed for diarrhea or loose stools.     . Multiple Vitamin (MULTI-VITAMINS) TABS Take 1 tablet by mouth daily.     [name] TAGRISSO 80 MG tablet TAKE 1 TABLET BY MOUTH DAILY. 30 tablet 2  . XARELTO 20 MG TABS tablet      No current facility-administered medications for this visit.     SURGICAL HISTORY:  Past Surgical History:  Procedure Laterality Date  . BRAIN SURGERY    . LEFT LOWER LOBECTOMY  05/2001  . LEFT OCCIPITAL CRANIOTOMY  09/21/2005   tumor  . LUNG LOBECTOMY    . NECK SURGERY    . ORIF ANKLE FRACTURE Left 10/02/2014   Procedure: OPEN REDUCTION INTERNAL FIXATION (ORIF) ANKLE FRACTURE;  Surgeon: Harvie Junior, MD;  Location: [facility];  Service: Orthopedics;  Laterality: Left;  . RADICAL NECK DISSECTION  10/18/2011   Procedure: RADICAL NECK DISSECTION;  Surgeon: Serena Colonel, MD;  Location[facility];   Service: ENT;  Laterality: Right;  RIGHT MODIFIED NECK DISSECTION /POSSIBLE RIGHT THYROIDECTOM  . THYROIDECTOMY  10/18/2011   Procedure: THYROIDECTOMY WITH RADICAL NECK DISSECTION;  Surgeon: Serena Colonel, MD;  Location: [facility] OR;  Service: ENT;  Laterality: Right;    REVIEW OF SYSTEMS:  A comprehensive review of systems was negative except for: Constitutional: positive for fatigue   PHYSICAL EXAMINATION: General appearance: alert, cooperative, fatigued and no distress Head: Normocephalic, without obvious abnormality, atraumatic Neck: no adenopathy, supple, symmetrical, trachea midline and thyroid not enlarged, symmetric, no tenderness/mass/nodules Lymph nodes: Cervical, supraclavicular, and axillary nodes normal. Resp: clear to auscultation bilaterally Back: symmetric, no curvature. ROM normal. No CVA tenderness.  Cardio: regular rate and rhythm, S1, S2 normal, no murmur, click, rub or gallop GI: soft, non-tender; bowel sounds normal; no masses,  no organomegaly Extremities: extremities normal, atraumatic, no cyanosis or edema  ECOG PERFORMANCE STATUS: 1 - Symptomatic but completely ambulatory  Blood pressure 121/70, pulse 80, temperature 98 F (36.7 C), resp. rate 18, height '5\' 7"'$  (1.702 m), weight 178 lb 8 oz (81 kg), SpO2 100 %.  LABORATORY DATA: Lab Results  Component Value Date   WBC 4.6 06/04/2017   HGB 12.2 06/04/2017   HCT 38.8 06/04/2017   MCV 93.7 06/04/2017   PLT 201 06/04/2017      Chemistry      Component Value Date/Time   NA 141 06/04/2017 1123   K 4.3 06/04/2017 1123   CL 102 08/29/2015 0505   CL 106 12/09/2012 0806   CO2 25 06/04/2017 1123   BUN 14.9 06/04/2017 1123   CREATININE 1.0 06/04/2017 1123      Component Value Date/Time   CALCIUM 9.8 06/04/2017 1123   ALKPHOS 107 06/04/2017 1123   AST 12 06/04/2017 1123   ALT 11 06/04/2017 1123   BILITOT 0.51 06/04/2017 1123       RADIOGRAPHIC STUDIES: Dg Lumbar Spine Complete  Result Date:  05/15/2017 CLINICAL DATA:  Status post fall 10 days ago with acute onset of back pain since then without radicular symptoms. History of lung malignancy. EXAM: LUMBAR SPINE - COMPLETE 4+ VIEW COMPARISON:  None in PACs FINDINGS: There is compression of the body of L1 amounting to approximately 20% anteriorly and 10% posteriorly. There is 10% anterior compression of L4 with prominent superior and inferior endplate depression. There is no spondylolisthesis. The disc space heights are well maintained. There is degenerative facet joint change at L4-5 and L5-S1. There is no spondylolisthesis. There is facet joint hypertrophy at L4-5 and L5-S1. IMPRESSION: Compression fractures of L1 and L4 that are of uncertain age given the lack of any previous studies. These could certainly be responsible for the patient's symptoms. Electronically Signed   By: David  Martinique M.D.   On: 05/15/2017 16:50    ASSESSMENT AND PLAN: This is a very pleasant 71 years old African-American female with metastatic non-small cell lung cancer, adenocarcinoma with positive EGFR mutation status post several years of treatment with Tarceva and the patient developed positive resistant mutation, T790M. The patient is currently on treatment with Tagrisso status post 30 months. She has been tolerating the treatment well with no significant adverse effects. She continues to have generalized fatigue. Her lab work today is unremarkable. I recommended for the patient to continue her current treatment with Tagrisso 80 mg by mouth daily. I would see her back for follow-up visit in 2 months for evaluation and repeat CBC and comprehensive metabolic panel. The patient was advised to call immediately if she has any concerning symptoms in the interval. The patient voices understanding of current disease status and treatment options and is in agreement with the current care plan. All questions were answered. The patient knows to call the clinic with any  problems, questions or concerns. We can certainly see the patient much sooner if necessary.  Disclaimer: This note was dictated with voice recognition software. Similar sounding words can inadvertently be transcribed and may not be corrected upon review.

## 2017-07-09 MED FILL — TAGRISSO 80 MG TABLET: 80 | 30 days supply | Qty: 30 | Fill #1

## 2017-08-06 ENCOUNTER — Encounter: Payer: Self-pay | Admitting: Internal Medicine

## 2017-08-06 ENCOUNTER — Ambulatory Visit: Payer: Medicare Other

## 2017-08-06 ENCOUNTER — Other Ambulatory Visit (HOSPITAL_BASED_OUTPATIENT_CLINIC_OR_DEPARTMENT_OTHER): Payer: Medicare Other

## 2017-08-06 ENCOUNTER — Telehealth: Payer: Self-pay | Admitting: Internal Medicine

## 2017-08-06 ENCOUNTER — Ambulatory Visit (HOSPITAL_BASED_OUTPATIENT_CLINIC_OR_DEPARTMENT_OTHER): Payer: Medicare Other | Admitting: Internal Medicine

## 2017-08-06 DIAGNOSIS — C7931 Secondary malignant neoplasm of brain: Secondary | ICD-10-CM | POA: Diagnosis not present

## 2017-08-06 DIAGNOSIS — C3411 Malignant neoplasm of upper lobe, right bronchus or lung: Secondary | ICD-10-CM

## 2017-08-06 DIAGNOSIS — Z23 Encounter for immunization: Secondary | ICD-10-CM

## 2017-08-06 DIAGNOSIS — C3490 Malignant neoplasm of unspecified part of unspecified bronchus or lung: Secondary | ICD-10-CM

## 2017-08-06 DIAGNOSIS — Z5111 Encounter for antineoplastic chemotherapy: Secondary | ICD-10-CM

## 2017-08-06 LAB — CBC WITH DIFFERENTIAL/PLATELET
BASO%: 0.6 % (ref 0.0–2.0)
Basophils Absolute: 0 10*3/uL (ref 0.0–0.1)
EOS%: 1.4 % (ref 0.0–7.0)
Eosinophils Absolute: 0.1 10*3/uL (ref 0.0–0.5)
HCT: 40.9 % (ref 34.8–46.6)
HGB: 13.1 g/dL (ref 11.6–15.9)
LYMPH%: 16.3 % (ref 14.0–49.7)
MCH: 29.3 pg (ref 25.1–34.0)
MCHC: 31.9 g/dL (ref 31.5–36.0)
MCV: 91.9 fL (ref 79.5–101.0)
MONO#: 0.3 10*3/uL (ref 0.1–0.9)
MONO%: 7.6 % (ref 0.0–14.0)
NEUT#: 3.1 10*3/uL (ref 1.5–6.5)
NEUT%: 74.1 % (ref 38.4–76.8)
Platelets: 207 10*3/uL (ref 145–400)
RBC: 4.45 10*6/uL (ref 3.70–5.45)
RDW: 14.7 % — ABNORMAL HIGH (ref 11.2–14.5)
WBC: 4.2 10*3/uL (ref 3.9–10.3)
lymph#: 0.7 10*3/uL — ABNORMAL LOW (ref 0.9–3.3)

## 2017-08-06 LAB — COMPREHENSIVE METABOLIC PANEL
ALT: 9 U/L (ref 0–55)
AST: 11 U/L (ref 5–34)
Albumin: 4.3 g/dL (ref 3.5–5.0)
Alkaline Phosphatase: 65 U/L (ref 40–150)
Anion Gap: 10 mEq/L (ref 3–11)
BUN: 15.4 mg/dL (ref 7.0–26.0)
CO2: 24 mEq/L (ref 22–29)
Calcium: 9.8 mg/dL (ref 8.4–10.4)
Chloride: 107 mEq/L (ref 98–109)
Creatinine: 1 mg/dL (ref 0.6–1.1)
EGFR: >60 mL/min/{1.73_m2} (ref >60)
Glucose: 99 mg/dl (ref 70–140)
Potassium: 3.9 mEq/L (ref 3.5–5.1)
Sodium: 141 mEq/L (ref 136–145)
Total Bilirubin: 0.66 mg/dL (ref 0.20–1.20)
Total Protein: 7.1 g/dL (ref 6.4–8.3)

## 2017-08-06 MED ORDER — INFLUENZA VAC SPLIT QUAD 0.5 ML IM SUSY
0.5000 mL | PREFILLED_SYRINGE | Freq: Once | INTRAMUSCULAR | Status: AC
  Administered 2017-08-06: 0.5 mL via INTRAMUSCULAR
  Filled 2017-08-06: qty 0.5

## 2017-08-06 NOTE — Progress Notes (Signed)
[place] Telephone:(336) [phone]   Fax:(336) [phone]  OFFICE PROGRESS NOTE  [name], [address] [place] [address]  PRINCIPAL DIAGNOSIS:  metastatic non-small cell lung cancer, adenocarcinoma with brain metastases initially diagnosed as a stage IIb in October 2002.   BIOMARKERS TESTING ([facility] One): Positive for: EGFR E746-A750 del, T790M, CTNNB1 D32G, SMAD4 T135f*10 Negative for: RET, ALK, BRAF, KRAS, ERBB2 and MET  PRIOR THERAPY:  1. Status post left lower lobectomy under the care of Dr BArlyce Dicein October 2002. 2. Status post course of concurrent chemoradiation with weekly carboplatin and paclitaxel under the care of Dr SBenay Spiceand Dr WElba Barmanin early 2003. 3. Status post treatment with Celebrex [facility] 4. Status post left occipital craniotomy for tumor resection on September 21, 2005, under the care of Dr CChristella Noa 5. Status post whole brain irradiation under the care of Dr WElba Barman completed November 07, 2005. 6. Status post gamma knife treatment to the [facility] 7. Status post right thyroidectomy with radical neck dissection under the care of Dr. RConstance Holsteron 10/18/2011. 8. Status post excisional biopsy of right cervical lymph node that was consistent with metastatic adenocarcinoma. 9. Tarceva 150 mg by mouth daily with therapy beginning 06/03/2007, discontinued in July 2015 secondary to disease progression. Status post 79 cycles. 10. palliative radiotherapy to the enlarging right upper lobe lung nodule under the care of Dr. MValere Dross [facility]. [id number] 300 mg by mouth daily for 21 days discontinued secondary to study deviation and [facility]. 12. Open reduction and  internal fixation of trimalleolar ankle fracture dislocation with fixation of the fibula as well as medial          malleolus.   CURRENT THERAPY: Tagrisso 80 mg by mouth daily started 11/25/2014, status post 32 months of treatment.  INTERVAL HISTORY: Valerie PODOLL71y.o. female returns to the clinic today for follow-up visit accompanied by her husband.  The patient is feeling fine today with no specific complaints except for mild fatigue and low back pain.  She denied having any chest pain, shortness of breath, cough or hemoptysis.  She denied having any fever or chills.  She has no nausea, vomiting, diarrhea or constipation.  She continues to tolerate her treatment with Tagrisso fairly well except for a few episodes of diarrhea improved with Imodium.  The patient is here today for evaluation and repeat blood work.   MEDICAL HISTORY: Past Medical History:  Diagnosis Date  . Abnormal Pap smear 2006  . Anal fistula   . Ankle fracture   . Anxiety   . Arthritis   . Atrophic vaginitis 2008  . Cataract   . Dementia 2009  . Dyspareunia 2008  . H/O osteoporosis   . H/O varicella   . H/O vitamin D deficiency   . Headache 07/24/2016  . Heart murmur   . History of measles, mumps, or rubella   . History of radiation therapy 07/28/13- 08/10/13   right lung metastasis 5000 cGy 10 sessions  . Hypertension   . Lung cancer ([facility]  . [place] ([place]   . [place] cancer ([facility] [place]   . Lung metastasis ([facility]    PET scan  05/05/13, RUL lung nodule  . Metastasis to brain [facility]) dx'd 2008  . Metastasis to lymph nodes ([place]) dx'd 09/2011  . Nodule of right lung CT- 06/03/12   RIGHT UPPER LOBE  . Nodule of right lung 06/03/12   Upper Lobe  . On antineoplastic chemotherapy    TARCEVA  . Osteoporosis 2010  . Primary cancer of right upper lobe of lung ([place]) 04/22/2009   Qualifier: Diagnosis of  By: Nils Pyle CMA ([place]), Mearl Latin    . Shortness of breath    hx lung ca   . Status post chemotherapy  2003   CARBOPLATIN/PACLITAXEL /STATUS POST CLINICAL TRAIL OF CELEBREX AND IRESSA AT [facility] FOR 1 YEAR  . Status post radiation therapy 2003   LEFT LUNG  . Status post radiation therapy 11/07/2005   WHOLE BRAIN: DR Larkin In[facility]  . Status post radiation therapy 06/02/2008   GAMMA KNIFE OF RESECTED CAVITAY  . Thyroid adenoma    ?  [name] Thyroid cancer ([place]) 10/18/11 bx   adenoid nodules   . Yeast infection     ALLERGIES:  has No Known Allergies.  MEDICATIONS:  Current Outpatient Medications  Medication Sig Dispense Refill  . acetaminophen-codeine (TYLENOL #3) 300-30 MG tablet     . alendronate (FOSAMAX) 70 MG tablet Take 1 tablet (70 mg total) by mouth every 7 (seven) days. Saturdays; Take with a full glass of water on an empty stomach. 4 tablet 12  . Ascorbic Acid (VITAMIN C) 100 MG tablet Take 100 mg by mouth daily.    . cholecalciferol (VITAMIN D) 1000 UNITS tablet Take 1,000 Units by mouth at bedtime.    . donepezil (ARICEPT) 10 MG tablet Take 10 mg by mouth daily.  0  . lidocaine (XYLOCAINE) 2 % jelly     . loperamide (IMODIUM) 2 MG capsule Take 2 mg by mouth as needed for diarrhea or loose stools.     . Multiple Vitamin (MULTI-VITAMINS) TABS Take 1 tablet by mouth daily.     [name] TAGRISSO 80 MG tablet TAKE 1 TABLET BY MOUTH DAILY. 30 tablet 2  . XARELTO 20 MG TABS tablet      No current facility-administered medications for this visit.     SURGICAL HISTORY:  Past Surgical History:  Procedure Laterality Date  . BRAIN SURGERY    . LEFT LOWER LOBECTOMY  05/2001  . LEFT OCCIPITAL CRANIOTOMY  09/21/2005   tumor  . LUNG LOBECTOMY    . NECK SURGERY    . ORIF ANKLE FRACTURE Left 10/02/2014   Procedure: OPEN REDUCTION INTERNAL FIXATION (ORIF) ANKLE FRACTURE;  Surgeon: Alta Corning, MD;  Location: [facility];  Service: Orthopedics;  Laterality: Left;  . RADICAL NECK DISSECTION  10/18/2011   Procedure: RADICAL NECK DISSECTION;  Surgeon: Izora Gala, MD;  Location: [place];  Service: ENT;   Laterality: Right;  RIGHT MODIFIED NECK DISSECTION /POSSIBLE RIGHT THYROIDECTOM  . THYROIDECTOMY  10/18/2011   Procedure: THYROIDECTOMY WITH RADICAL NECK DISSECTION;  Surgeon: Izora Gala, MD;  Location: [place];  Service: ENT;  Laterality: Right;    REVIEW OF SYSTEMS:  A comprehensive review of systems was negative except for: Constitutional: positive for fatigue   PHYSICAL EXAMINATION: General appearance: alert, cooperative, fatigued and no distress Head: Normocephalic, without obvious abnormality, atraumatic Neck: no adenopathy, supple, symmetrical, trachea midline and thyroid not enlarged, symmetric, no tenderness/mass/nodules Lymph nodes: Cervical, supraclavicular, and axillary nodes normal. Resp: clear to auscultation bilaterally Back: symmetric, no curvature. ROM normal. No CVA tenderness. Cardio: regular  rate and rhythm, S1, S2 normal, no murmur, click, rub or gallop GI: soft, non-tender; bowel sounds normal; no masses,  no organomegaly Extremities: extremities normal, atraumatic, no cyanosis or edema  ECOG PERFORMANCE STATUS: 1 - Symptomatic but completely ambulatory  Blood pressure 116/71, pulse 96, temperature 97.7 F (36.5 C), temperature source Oral, resp. rate (!) 24, height _0  (1.702 m), weight 180 lb 4.8 oz (81.8 kg), SpO2 100 %.  LABORATORY DATA: Lab Results  Component Value Date   WBC 4.2 08/06/2017   HGB 13.1 08/06/2017   HCT 40.9 08/06/2017   MCV 91.9 08/06/2017   PLT 207 08/06/2017      Chemistry      Component Value Date/Time   NA 141 06/04/2017 1123   K 4.3 06/04/2017 1123   CL 102 08/29/2015 0505   CL 106 12/09/2012 0806   CO2 25 06/04/2017 1123   BUN 14.9 06/04/2017 1123   CREATININE 1.0 06/04/2017 1123      Component Value Date/Time   CALCIUM 9.8 06/04/2017 1123   ALKPHOS 107 06/04/2017 1123   AST 12 06/04/2017 1123   ALT 11 06/04/2017 1123   BILITOT 0.51 06/04/2017 1123       RADIOGRAPHIC STUDIES: No results found.  ASSESSMENT AND  PLAN: This is a very pleasant 71 years old African-American female with metastatic non-small cell lung cancer, adenocarcinoma with positive EGFR mutation status post several years of treatment with Tarceva and the patient developed positive resistant mutation, T790M. The patient is currently on treatment with Tagrisso status post 32 months.  She continues to tolerate Tagrisso fairly well with no significant adverse effect except for mild diarrhea. CBC today is unremarkable.  I recommended for the patient to continue her current treatment with Tagrisso with the same dose. I would see the patient back for follow-up visit in 2 months for evaluation after repeating CT scan of the neck, chest, abdomen and pelvis as well as MRI of the brain for restaging of her disease. She was advised to call immediately if she has any concerning symptoms in the interval. The patient voices understanding of current disease status and treatment options and is in agreement with the current care plan. All questions were answered. The patient knows to call the clinic with any problems, questions or concerns. We can certainly see the patient much sooner if necessary.  Disclaimer: This note was dictated with voice recognition software. Similar sounding words can inadvertently be transcribed and may not be corrected upon review.

## 2017-08-06 NOTE — Telephone Encounter (Signed)
Gave avs and calendar for February 2019. Patient declined contrast

## 2017-08-19 IMAGING — CT CT CHEST W/ CM
2 of 5 series · 12 of 36 positions shown, 15 images · IV contrast (omnipaque)
Comparison: Chest radiographs earlier this day. Chest abdomen
pelvis CT 05/25/2015

CLINICAL DATA: Shortness of breath for 2 days with productive
cough. Lung cancer patient currently on chemotherapy.

EXAM:
CT CHEST, ABDOMEN, AND PELVIS WITH CONTRAST
TECHNIQUE: Multidetector CT imaging of the chest, abdomen and pelvis was
performed following the standard protocol during bolus
administration of intravenous contrast.
CONTRAST:  25mL OMNIPAQUE IOHEXOL 300 MG/ML SOLN, 100mL OMNIPAQUE
IOHEXOL 300 MG/ML SOLN

[Series 2: c/a/p with · axial · 0.75mm/px · z∈[+1016,+1620]mm · 9 of 139 slices shown, 12 images]
[im 9/139  mediastinal]
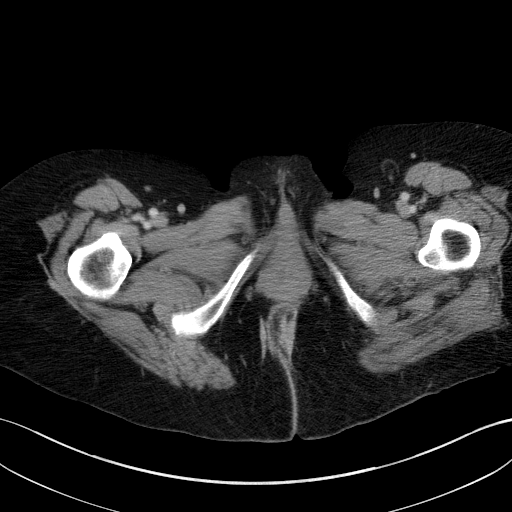
[im 9/139  lung]
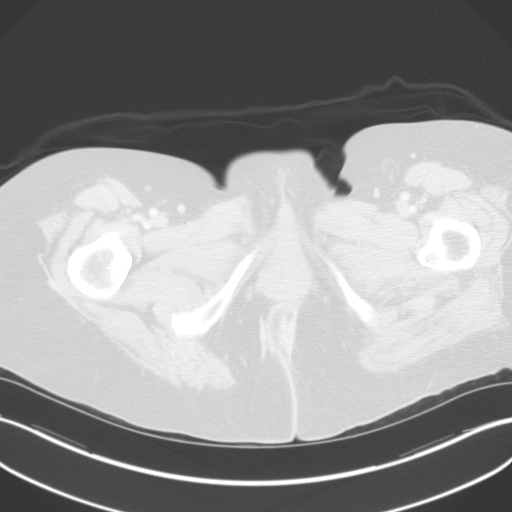
[im 26/139  lung]
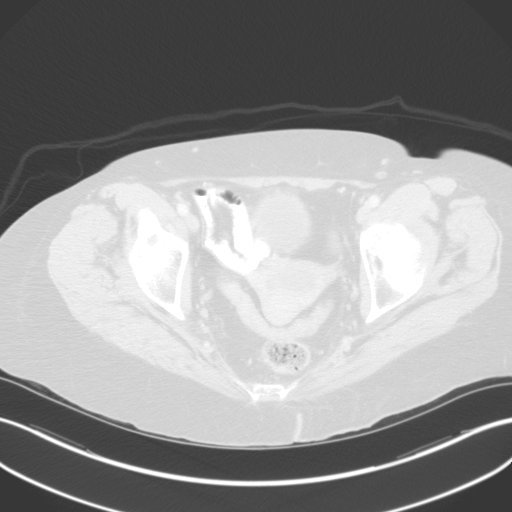
[im 44/139  lung]
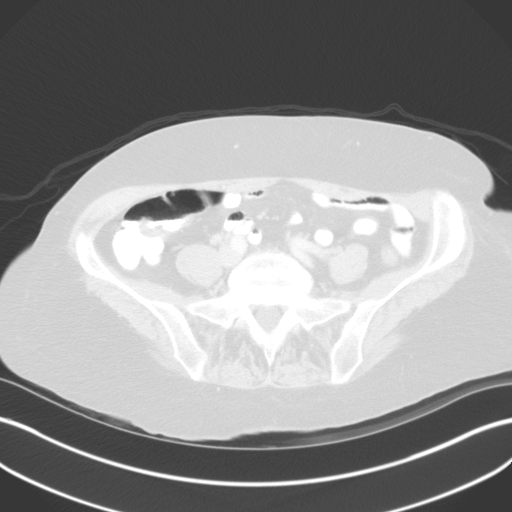
[im 52/139  lung]
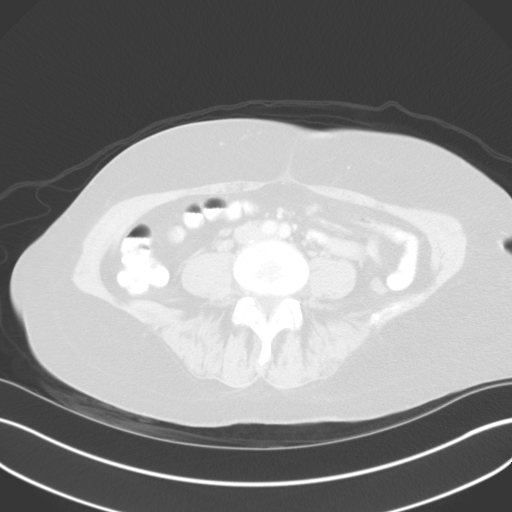
[im 70/139  mediastinal]
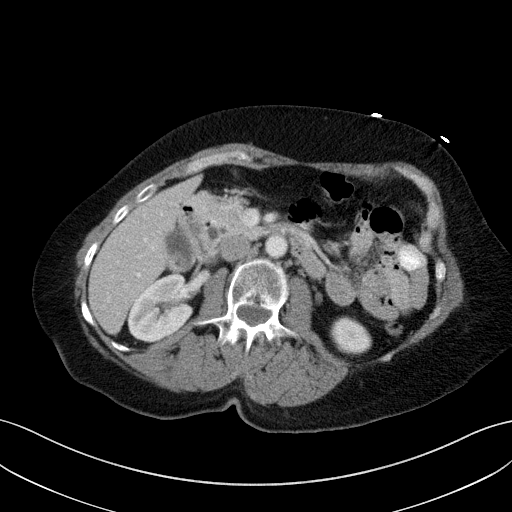
[im 70/139  lung]
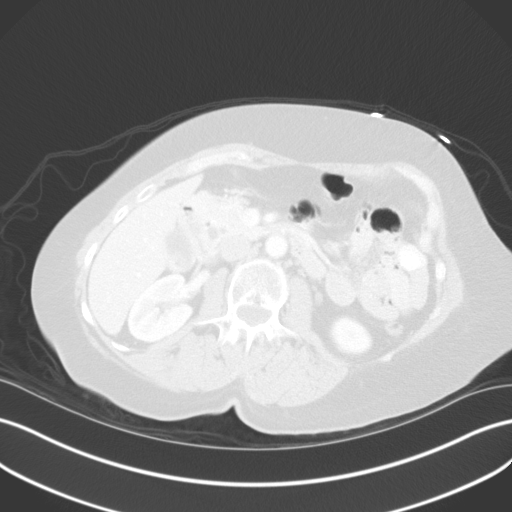
[im 87/139  lung]
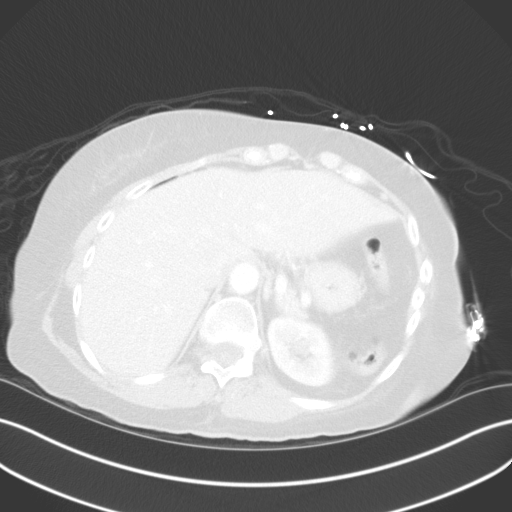
[im 95/139  lung]
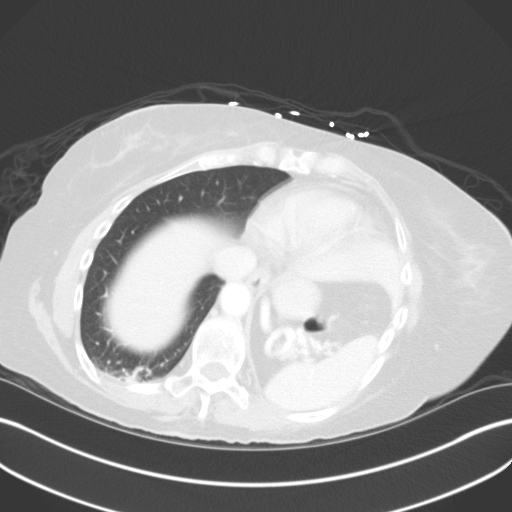
[im 113/139  lung]
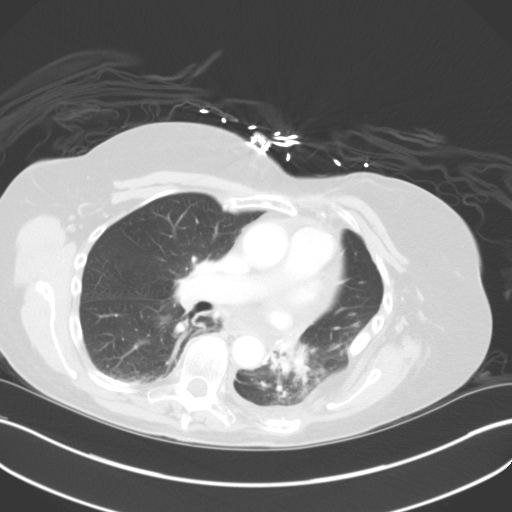
[im 130/139  mediastinal]
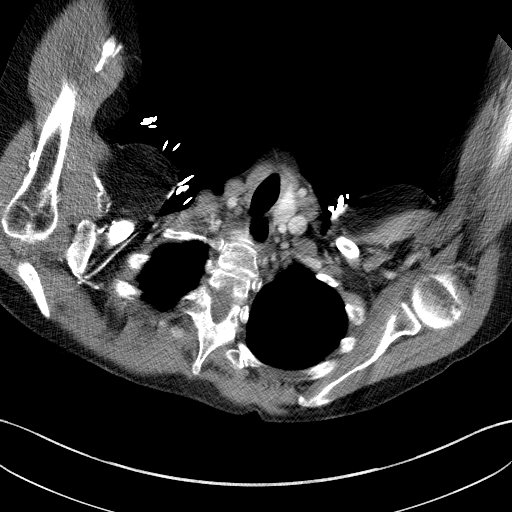
[im 130/139  lung]
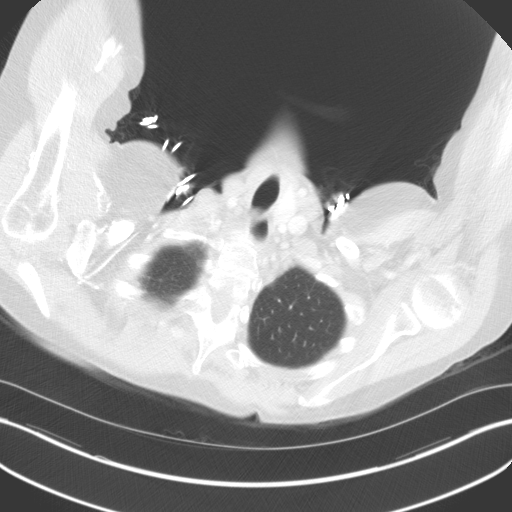

[Series 6: coronal · coronal · 0.76mm/px · 3 of 87 slices shown]
[im 18/87  lung]
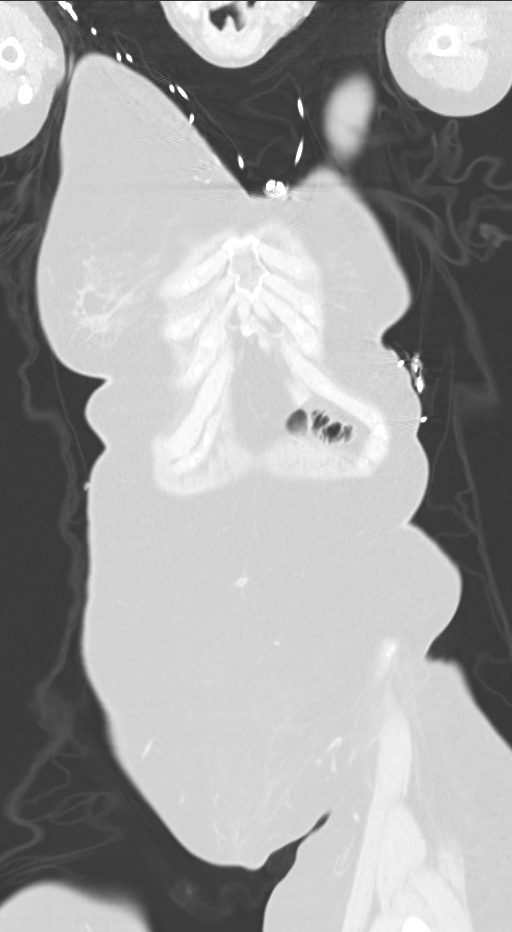
[im 35/87  lung]
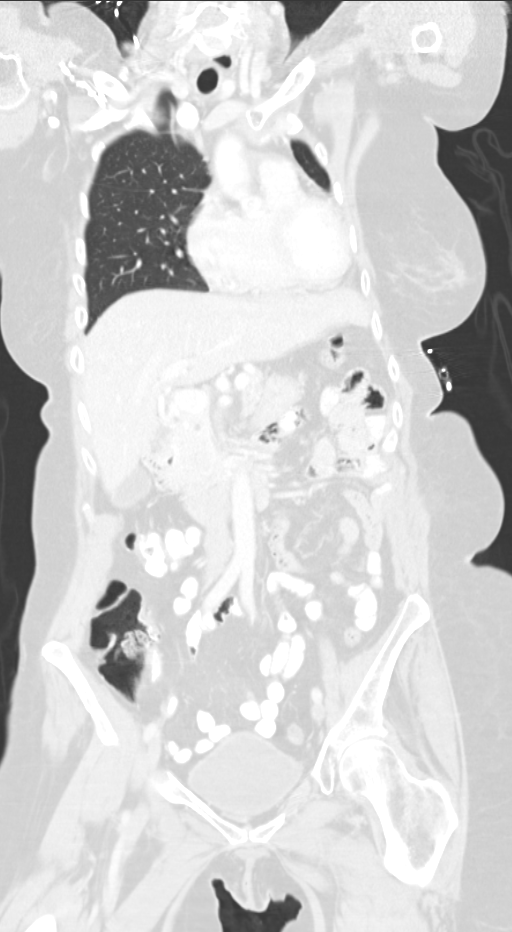
[im 52/87  lung]
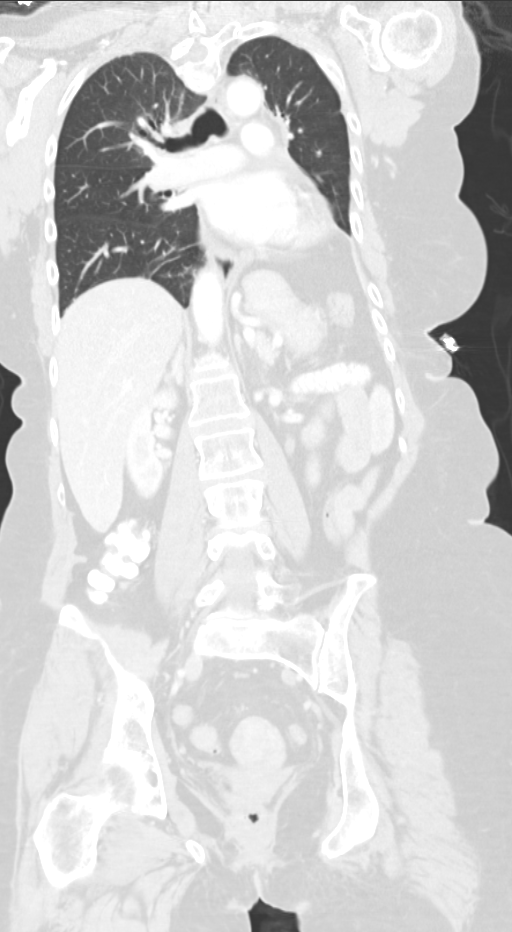

[12 of 36 positions shown; findings below may reference images not displayed]

FINDINGS: CT CHEST

Volume loss in the left hemithorax post left upper lobe resection.
Paramediastinal radiation changes again seen. New progressive
ill-defined left lower lobe perihilar opacity with adjacent
ground-glass opacities. Question of intraluminal debris in the left
mainstem bronchus, new. Increased dependent atelectasis in the left
lower lobe. Small left pleural effusion with equivocal increase in
thickness. Paramediastinal radiation change in the right upper lobe
is stable. Scattered small pulmonary nodules in both lungs again
seen, unchanged.

Heart is unchanged in size. Tortuous thoracic aorta without
aneurysm. No pericardial effusion. No mediastinal or hilar
adenopathy. Absent right lobe of the thyroid gland again seen.
Minimal fluid in the esophagus without definite wall thickening.

There are no acute or suspicious osseous abnormalities. Exaggerated
thoracic kyphosis and degenerative change in the thoracic spine and
sternal deformity are unchanged.

CT ABDOMEN AND PELVIS

Tiny hypodensity in the left lobe of the liver, image 56, unchanged
from prior allowing for differences in technique. No new hepatic
abnormality. Gallbladder physiologically distended without calcified
stone. No biliary dilatation. No adrenal nodule. No pancreatic
ductal dilatation or surrounding inflammation. Spleen is normal.

The stomach is decompressed. There are no dilated or thickened bowel
loops. No bowel obstruction, oral contrast throughout the colon.
Minimal diverticulosis of the distal colon without diverticulitis.

Kidneys demonstrate symmetric enhancement and excretion. Duplicated
left renal collecting system again seen. No hydronephrosis.

No retroperitoneal adenopathy. Abdominal aorta is normal in caliber.
Right renal artery aneurysm, small, unchanged.

No free air, free fluid, or intra-abdominal fluid collection.

Unchanged appearance of the uterus with uterine fibroid. Bladder is
physiologically distended. No adnexal mass.

There are no acute or suspicious osseous abnormalities. Compression
deformity of L4 is unchanged.
IMPRESSION: 1. Post left upper lobectomy with postsurgical and post treatment
related change in the left lung. Adjacent to the left hilum is new
ill-defined and ground-glass opacities. There is questionable
intraluminal debris in the left bronchus. Aspiration is considered
versus infectious pneumonia. Recurent disease is not entirely
excluded.
2. Small left pleural effusion with minimal increase from prior.
3. No acute abnormality in the abdomen/pelvis. Stable chronic
findings as described.

## 2017-08-19 IMAGING — CR DG CHEST 2V
2 series · 2 of 2 positions shown · non-contrast
Comparison: September 30, 2014.

CLINICAL DATA: Shortness of breath, productive cough.

EXAM:
CHEST  2 VIEW

[w chest lat]
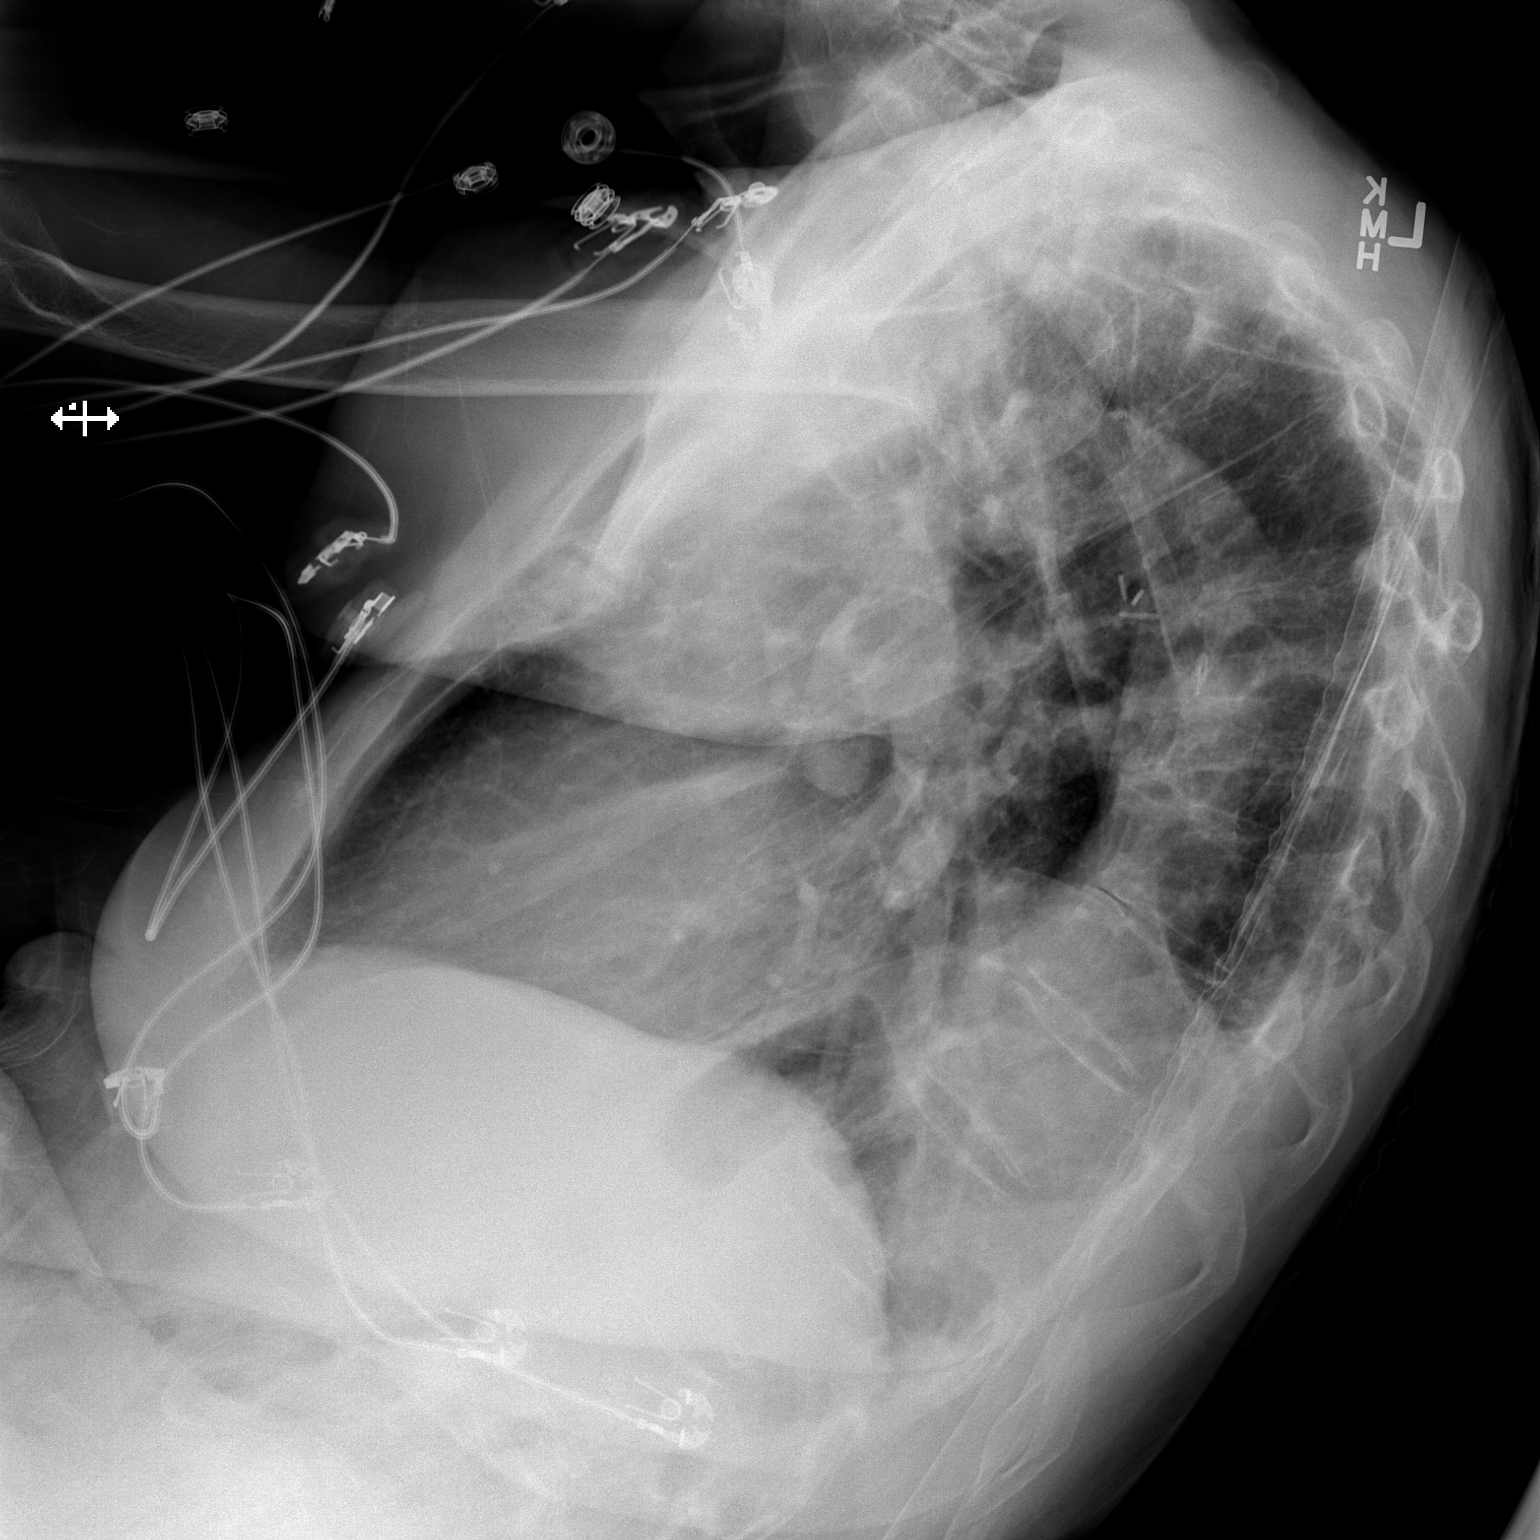

[x chest ap]
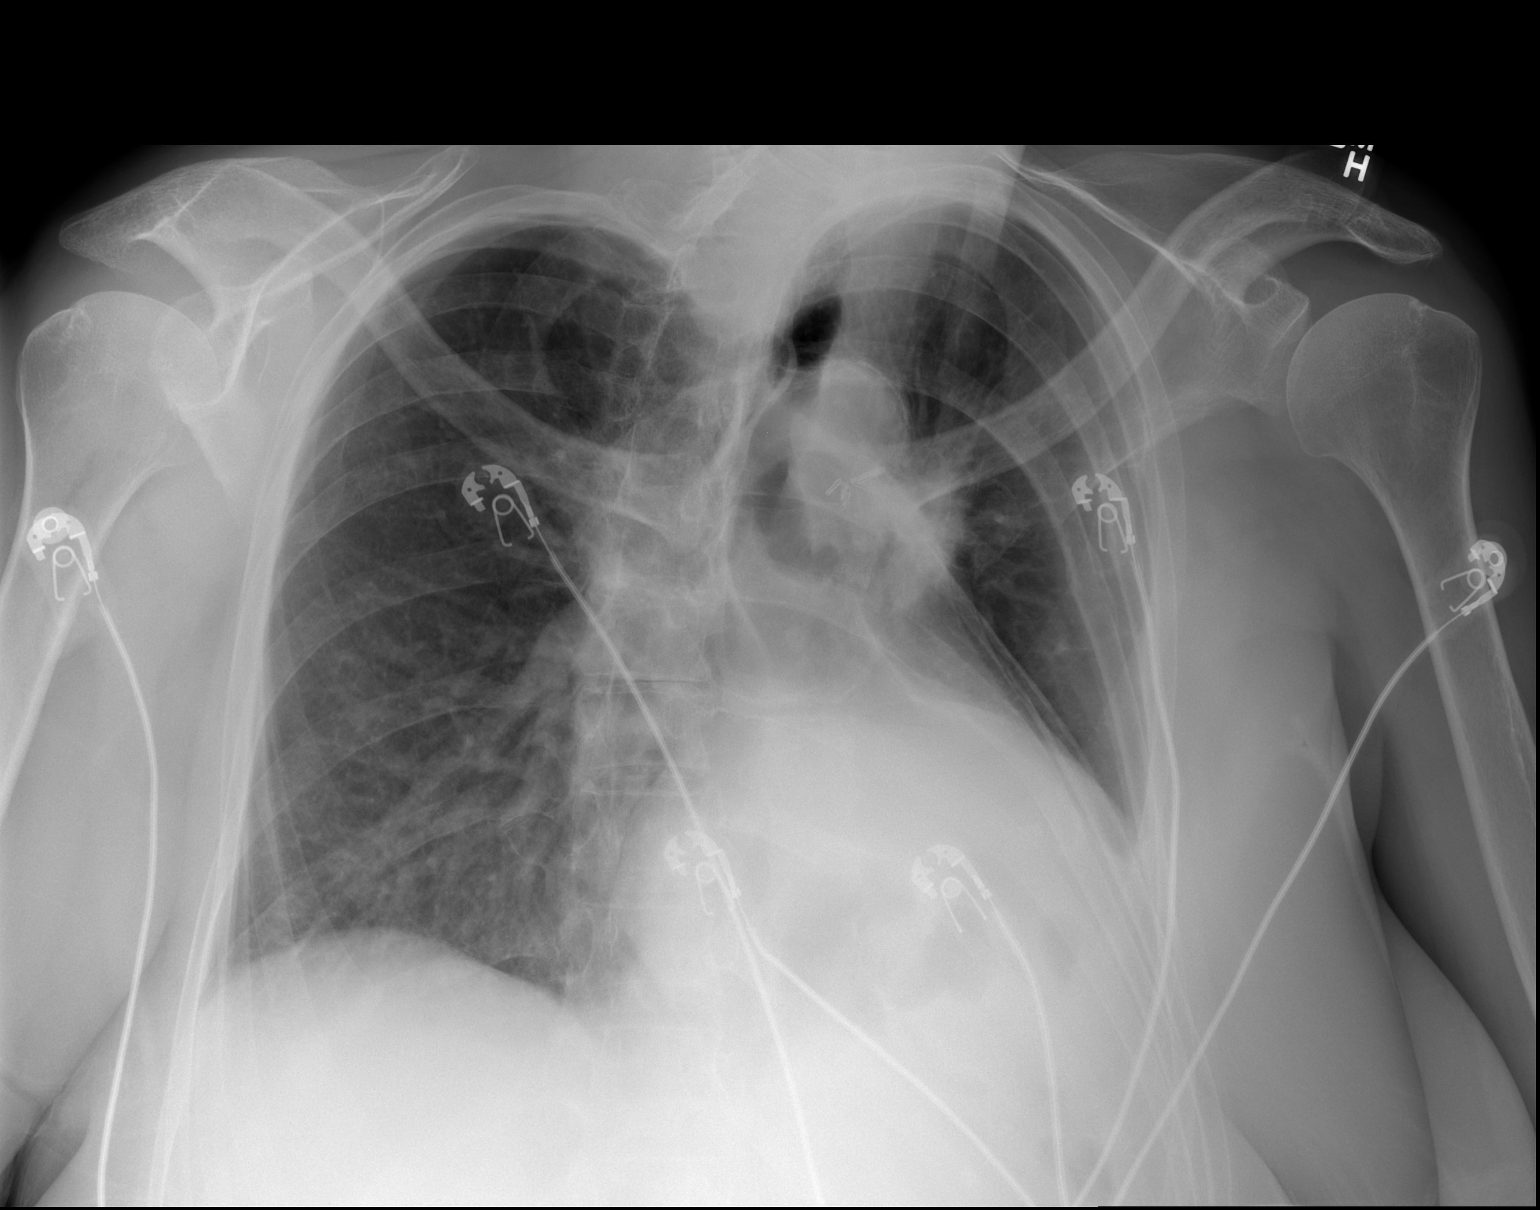

[2 of 2 positions shown; findings below may reference images not displayed]

FINDINGS: Stable cardiomegaly is noted. Postsurgical changes are again
identified in the left hilum. No pneumothorax is noted. There is
again noted elevated left hemidiaphragm posteriorly. Patient is
rotated to the left. No acute pulmonary disease is noted. No
definite pleural effusion is noted.
IMPRESSION: Stable postsurgical changes as described above. No acute abnormality
seen.

## 2017-08-30 MED FILL — TAGRISSO 80 MG TABLET: 80 | 30 days supply | Qty: 30 | Fill #2

## 2017-09-27 ENCOUNTER — Other Ambulatory Visit: Payer: Self-pay | Admitting: Internal Medicine

## 2017-09-27 DIAGNOSIS — C3411 Malignant neoplasm of upper lobe, right bronchus or lung: Secondary | ICD-10-CM

## 2017-10-01 ENCOUNTER — Ambulatory Visit (HOSPITAL_COMMUNITY)
Admission: RE | Admit: 2017-10-01 | Discharge: 2017-10-01 | Disposition: A | Discharge status: Home / Self Care | Payer: Medicare Other | Source: Ambulatory Visit | Attending: Internal Medicine | Admitting: Internal Medicine

## 2017-10-01 DIAGNOSIS — C3490 Malignant neoplasm of unspecified part of unspecified bronchus or lung: Secondary | ICD-10-CM

## 2017-10-01 DIAGNOSIS — G319 Degenerative disease of nervous system, unspecified: Secondary | ICD-10-CM | POA: Diagnosis not present

## 2017-10-01 MED ORDER — GADOBENATE DIMEGLUMINE 529 MG/ML IV SOLN
20.0000 mL | Freq: Once | INTRAVENOUS | Status: AC | PRN
  Administered 2017-10-01: 17 mL via INTRAVENOUS

## 2017-10-02 ENCOUNTER — Telehealth: Payer: Self-pay | Admitting: Internal Medicine

## 2017-10-02 MED FILL — TAGRISSO 80 MG TABLET: 80 | 30 days supply | Qty: 30 | Fill #0

## 2017-10-02 NOTE — Telephone Encounter (Signed)
Scheduled appt per 2/13 sch message - unable to reach patient or leave vmail.

## 2017-10-04 ENCOUNTER — Ambulatory Visit (HOSPITAL_COMMUNITY)
Admission: RE | Admit: 2017-10-04 | Discharge: 2017-10-04 | Disposition: A | Discharge status: Home / Self Care | Payer: Medicare Other | Source: Ambulatory Visit | Attending: Internal Medicine | Admitting: Internal Medicine

## 2017-10-04 ENCOUNTER — Ambulatory Visit (HOSPITAL_COMMUNITY): Admission: RE | Admit: 2017-10-04 | Payer: Medicare Other | Source: Ambulatory Visit

## 2017-10-04 ENCOUNTER — Encounter (HOSPITAL_COMMUNITY): Payer: Self-pay

## 2017-10-04 ENCOUNTER — Inpatient Hospital Stay: Payer: Medicare Other | Attending: Internal Medicine

## 2017-10-04 DIAGNOSIS — C7931 Secondary malignant neoplasm of brain: Secondary | ICD-10-CM | POA: Diagnosis not present

## 2017-10-04 DIAGNOSIS — J90 Pleural effusion, not elsewhere classified: Secondary | ICD-10-CM | POA: Insufficient documentation

## 2017-10-04 DIAGNOSIS — C3490 Malignant neoplasm of unspecified part of unspecified bronchus or lung: Secondary | ICD-10-CM | POA: Insufficient documentation

## 2017-10-04 DIAGNOSIS — Z85850 Personal history of malignant neoplasm of thyroid: Secondary | ICD-10-CM | POA: Insufficient documentation

## 2017-10-04 DIAGNOSIS — R197 Diarrhea, unspecified: Secondary | ICD-10-CM | POA: Insufficient documentation

## 2017-10-04 DIAGNOSIS — M818 Other osteoporosis without current pathological fracture: Secondary | ICD-10-CM | POA: Insufficient documentation

## 2017-10-04 DIAGNOSIS — M4856XA Collapsed vertebra, not elsewhere classified, lumbar region, initial encounter for fracture: Secondary | ICD-10-CM | POA: Diagnosis not present

## 2017-10-04 LAB — CBC WITH DIFFERENTIAL/PLATELET
Basophils Absolute: 0 10*3/uL (ref 0.0–0.1)
Basophils Relative: 0 %
Eosinophils Absolute: 0 10*3/uL (ref 0.0–0.5)
Eosinophils Relative: 1 %
HCT: 38.4 % (ref 34.8–46.6)
Hemoglobin: 12.4 g/dL (ref 11.6–15.9)
Lymphocytes Relative: 19 %
Lymphs Abs: 0.7 10*3/uL — ABNORMAL LOW (ref 0.9–3.3)
MCH: 29.3 pg (ref 25.1–34.0)
MCHC: 32.3 g/dL (ref 31.5–36.0)
MCV: 90.6 fL (ref 79.5–101.0)
Monocytes Absolute: 0.3 10*3/uL (ref 0.1–0.9)
Monocytes Relative: 8 %
Neutro Abs: 2.8 10*3/uL (ref 1.5–6.5)
Neutrophils Relative %: 72 %
Platelets: 192 10*3/uL (ref 145–400)
RBC: 4.23 MIL/uL (ref 3.70–5.45)
RDW: 14.6 % — ABNORMAL HIGH (ref 11.2–14.5)
WBC: 3.8 10*3/uL — ABNORMAL LOW (ref 3.9–10.3)

## 2017-10-04 LAB — COMPREHENSIVE METABOLIC PANEL
ALT: 8 U/L (ref 0–55)
AST: 12 U/L (ref 5–34)
Albumin: 4.4 g/dL (ref 3.5–5.0)
Alkaline Phosphatase: 62 U/L (ref 40–150)
Anion gap: 10 (ref 3–11)
BUN: 13 mg/dL (ref 7–26)
CO2: 26 mmol/L (ref 22–29)
Calcium: 10 mg/dL (ref 8.4–10.4)
Chloride: 103 mmol/L (ref 98–109)
Creatinine, Ser: 0.92 mg/dL (ref 0.60–1.10)
GFR calc Af Amer: >60 mL/min (ref >60)
GFR calc non Af Amer: >60 mL/min (ref >60)
Glucose, Bld: 96 mg/dL (ref 70–140)
Potassium: 3.6 mmol/L (ref 3.5–5.1)
Sodium: 139 mmol/L (ref 136–145)
Total Bilirubin: 0.8 mg/dL (ref 0.2–1.2)
Total Protein: 7.1 g/dL (ref 6.4–8.3)

## 2017-10-04 MED ORDER — IOPAMIDOL (ISOVUE-300) INJECTION 61%
INTRAVENOUS | Status: AC
  Administered 2017-10-04: 100 mL via INTRAVENOUS
  Filled 2017-10-04: qty 100

## 2017-10-04 MED ORDER — IOPAMIDOL (ISOVUE-300) INJECTION 61%
30.0000 mL | Freq: Once | INTRAVENOUS | Status: AC | PRN
  Administered 2017-10-04: 30 mL via ORAL

## 2017-10-04 MED ORDER — IOPAMIDOL (ISOVUE-300) INJECTION 61%
INTRAVENOUS | Status: AC
  Filled 2017-10-04: qty 30

## 2017-10-04 MED ORDER — IOPAMIDOL (ISOVUE-300) INJECTION 61%
100.0000 mL | Freq: Once | INTRAVENOUS | Status: AC | PRN
  Administered 2017-10-04: 100 mL via INTRAVENOUS

## 2017-10-08 ENCOUNTER — Other Ambulatory Visit: Payer: Medicare Other

## 2017-10-15 ENCOUNTER — Encounter: Payer: Self-pay | Admitting: *Deleted

## 2017-10-15 ENCOUNTER — Inpatient Hospital Stay (HOSPITAL_BASED_OUTPATIENT_CLINIC_OR_DEPARTMENT_OTHER): Payer: Medicare Other | Admitting: Internal Medicine

## 2017-10-15 ENCOUNTER — Encounter: Payer: Self-pay | Admitting: Internal Medicine

## 2017-10-15 ENCOUNTER — Telehealth: Payer: Self-pay

## 2017-10-15 VITALS — BP 139/75 | HR 87 | Temp 97.7°F | Resp 16 | Ht 67.0 in | Wt 175.7 lb

## 2017-10-15 DIAGNOSIS — C3411 Malignant neoplasm of upper lobe, right bronchus or lung: Secondary | ICD-10-CM

## 2017-10-15 DIAGNOSIS — Z79899 Other long term (current) drug therapy: Secondary | ICD-10-CM

## 2017-10-15 DIAGNOSIS — R197 Diarrhea, unspecified: Secondary | ICD-10-CM | POA: Diagnosis not present

## 2017-10-15 DIAGNOSIS — C3490 Malignant neoplasm of unspecified part of unspecified bronchus or lung: Secondary | ICD-10-CM | POA: Diagnosis not present

## 2017-10-15 NOTE — Progress Notes (Signed)
Oncology Nurse Navigator Documentation  Oncology Nurse Navigator Flowsheets 10/15/2017  Navigator Location [facility]  Navigator Encounter Type Clinic/MDC/spoke with patient and husband today at clinic. She is doing well.  No barriers identified at this time.   Patient Visit Type MedOnc  Treatment Phase Treatment  Barriers/Navigation Needs No barriers at this time  Acuity Level 1  Time Spent with Patient 15

## 2017-10-15 NOTE — Progress Notes (Signed)
[place] [phone]   Fax:([phone]  OFFICE PROGRESS NOTE  [name], [address] [place] [address]  PRINCIPAL DIAGNOSIS:  metastatic non-small cell lung cancer, adenocarcinoma with brain metastases initially diagnosed as a stage IIb in October 2002.   BIOMARKERS TESTING ([facility]): Positive for: EGFR [id number] del, T790M, CTNNB1 D32G, SMAD4 T163f*10 Negative for: RET, ALK, BRAF, KRAS, ERBB2 and MET  PRIOR THERAPY:  1. Status post left lower lobectomy under the care of Dr BArlyce Dicein October 2002. 2. Status post course of concurrent chemoradiation with weekly carboplatin and paclitaxel under the care of Dr SBenay Spiceand Dr WElba Barmanin early 2003. 3. Status post treatment [facility] 4. Status post left occipital craniotomy for tumor resection on September 21, 2005, under the care of Dr CChristella Noa 5. Status post whole brain irradiation under the care of Dr WElba Barman completed November 07, 2005. 6. Status post gamma knife [facility]. Status post right thyroidectomy with radical neck dissection under the care of Dr. RConstance Holsteron 10/18/2011. 8. Status post excisional biopsy of right cervical lymph node that was consistent with metastatic adenocarcinoma. 9. Tarceva 150 mg by mouth daily with therapy beginning 06/03/2007, discontinued in July 2015 secondary to disease progression. Status post 79 cycles. 10. palliative radiotherapy to the enlarging right upper lobe lung nodule under the care of Dr. MValere Dross [facility]. [id number] 300 mg by mouth daily for 21 days discontinued secondary to study deviation and [facility]. 12. Open reduction and  internal fixation of trimalleolar ankle fracture dislocation with fixation of the fibula as well as medial          malleolus.   CURRENT THERAPY: Tagrisso 80 mg by mouth daily started 11/25/2014, status post 34 months of treatment.  INTERVAL HISTORY: JKLAIR LEISING72y.o. female returns to the clinic today for follow-up visit accompanied by her husband.  The patient is feeling fine today with no specific complaints except for the generalized fatigue.  She denied having any chest pain, shortness breath, cough or hemoptysis.  She denied having any nausea or vomiting but continues to have a few episodes of diarrhea and no constipation.  She denied having any recent weight loss or night sweats.  She has no headache or visual changes.  The patient continues to tolerate her treatment with Tagrisso fairly well.  The patient had repeat CT scan of the neck, chest, abdomen and pelvis as well as MRI of the brain performed recently and she is here for evaluation and discussion of her imaging studies.  MEDICAL HISTORY: Past Medical History:  Diagnosis Date  . Abnormal Pap smear 2006  . Anal fistula   . Ankle fracture   . Anxiety   . Arthritis   . Atrophic vaginitis 2008  . Cataract   . Dementia 2009  . Dyspareunia 2008  . H/O osteoporosis   . H/O varicella   . H/O vitamin D deficiency   . Headache 07/24/2016  . Heart murmur   . History of measles, mumps, or rubella   . History of radiation therapy 07/28/13- 08/10/13   right lung metastasis 5000 cGy 10 sessions  .  Hypertension   . Lung cancer ([place]) dx'd 2002  . Lung cancer ([place])   . Lung cancer ([place])   . Lung metastasis ([place])    PET scan 05/05/13, RUL lung nodule  . Metastasis to brain [facility]) dx'd 2008  . 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 Nodule of right lung CT- 06/03/12   RIGHT UPPER LOBE  . Nodule of right lung 06/03/12   Upper Lobe  . On antineoplastic chemotherapy    TARCEVA  . Osteoporosis 2010  . Primary  cancer of right upper lobe of lung ([place]) 04/22/2009   Qualifier: Diagnosis of  By: Nils Pyle CMA ([place]), Mearl Latin    . Shortness of breath    hx lung ca   . Status post chemotherapy 2003   CARBOPLATIN/PACLITAXEL /STATUS POST CLINICAL TRAIL OF CELEBREX AND IRESSA AT [facility] FOR 1 YEAR  . Status post radiation therapy 2003   LEFT LUNG  . Status post radiation therapy 11/07/2005   WHOLE BRAIN: DR Larkin [facility]  . Status post radiation therapy 06/02/2008   GAMMA KNIFE OF RESECTED CAVITAY  . Thyroid adenoma    ?  [name] Thyroid cancer ([place]) 10/18/11 bx   adenoid nodules   . Yeast infection     ALLERGIES:  has No Known Allergies.  MEDICATIONS:  Current Outpatient Medications  Medication Sig Dispense Refill  . Ascorbic Acid (VITAMIN C) 100 MG tablet Take 100 mg by mouth daily.    . cholecalciferol (VITAMIN D) 1000 UNITS tablet Take 1,000 Units by mouth at bedtime.    . donepezil (ARICEPT) 10 MG tablet Take 10 mg by mouth daily.  0  . loperamide (IMODIUM) 2 MG capsule Take 2 mg by mouth as needed for diarrhea or loose stools.     . Multiple Vitamin (MULTI-VITAMINS) TABS Take 1 tablet by mouth daily.     . nitrofurantoin, macrocrystal-monohydrate, (MACROBID) 100 MG capsule Take 1 capsule by mouth 2 (two) times daily.    [name] TAGRISSO 80 MG tablet TAKE 1 TABLET BY MOUTH DAILY. 30 tablet 2  . XARELTO 20 MG TABS tablet      No current facility-administered medications for this visit.     SURGICAL HISTORY:  Past Surgical History:  Procedure Laterality Date  . BRAIN SURGERY    . LEFT LOWER LOBECTOMY  05/2001  . LEFT OCCIPITAL CRANIOTOMY  09/21/2005   tumor  . LUNG LOBECTOMY    . NECK SURGERY    . ORIF ANKLE FRACTURE Left 10/02/2014   Procedure: OPEN REDUCTION INTERNAL FIXATION (ORIF) ANKLE FRACTURE;  Surgeon: Alta Corning, MD;  Location: [facility];  Service: Orthopedics;  Laterality: Left;  . RADICAL NECK DISSECTION  10/18/2011   Procedure: RADICAL NECK DISSECTION;  Surgeon: Izora Gala, MD;  Location: [facility];   Service: ENT;  Laterality: Right;  RIGHT MODIFIED NECK DISSECTION /POSSIBLE RIGHT THYROIDECTOM  . THYROIDECTOMY  10/18/2011   Procedure: THYROIDECTOMY WITH RADICAL NECK DISSECTION;  Surgeon: Izora Gala, MD;  Location: [place] [facility];  Service: ENT;  Laterality: Right;    REVIEW OF SYSTEMS:  Constitutional: positive for fatigue Eyes: negative Ears, nose, mouth, throat, and face: negative Respiratory: negative Cardiovascular: negative Gastrointestinal: positive for diarrhea Genitourinary:negative Integument/breast: negative Hematologic/lymphatic: negative Musculoskeletal:negative Neurological: negative Behavioral/Psych: negative Endocrine: negative Allergic/Immunologic: negative   PHYSICAL EXAMINATION: General appearance: alert, cooperative, fatigued and no distress Head: Normocephalic, without obvious abnormality, atraumatic Neck: no adenopathy, supple, symmetrical, trachea midline and thyroid not enlarged, symmetric, no tenderness/mass/nodules Lymph nodes: Cervical, supraclavicular, and axillary nodes  normal. Resp: clear to auscultation bilaterally Back: symmetric, no curvature. ROM normal. No CVA tenderness. Cardio: regular rate and rhythm, S1, S2 normal, no murmur, click, rub or gallop GI: soft, non-tender; bowel sounds normal; no masses,  no organomegaly Extremities: extremities normal, atraumatic, no cyanosis or edema Neurologic: Alert and oriented X 3, normal strength and tone. Normal symmetric reflexes. Normal coordination and gait  ECOG PERFORMANCE STATUS: 1 - Symptomatic but completely ambulatory  Blood pressure 139/75, pulse 87, temperature 97.7 F (36.5 C), temperature source Oral, resp. rate 16, height _0  (1.702 m), weight 175 lb 11.2 oz (79.7 kg), SpO2 99 %.  LABORATORY DATA: Lab Results  Component Value Date   WBC 3.8 (L) 10/04/2017   HGB 12.4 10/04/2017   HCT 38.4 10/04/2017   MCV 90.6 10/04/2017   PLT 192 10/04/2017      Chemistry      Component Value  Date/Time   NA 139 10/04/2017 1342   NA 141 08/06/2017 1047   K 3.6 10/04/2017 1342   K 3.9 08/06/2017 1047   CL 103 10/04/2017 1342   CL 106 12/09/2012 0806   CO2 26 10/04/2017 1342   CO2 24 08/06/2017 1047   BUN 13 10/04/2017 1342   BUN 15.4 08/06/2017 1047   CREATININE 0.92 10/04/2017 1342   CREATININE 1.0 08/06/2017 1047      Component Value Date/Time   CALCIUM 10.0 10/04/2017 1342   CALCIUM 9.8 08/06/2017 1047   ALKPHOS 62 10/04/2017 1342   ALKPHOS 65 08/06/2017 1047   AST 12 10/04/2017 1342   AST 11 08/06/2017 1047   ALT 8 10/04/2017 1342   ALT 9 08/06/2017 1047   BILITOT 0.8 10/04/2017 1342   BILITOT 0.66 08/06/2017 1047       RADIOGRAPHIC STUDIES: Ct Soft Tissue Neck W Contrast  Result Date: 10/04/2017 CLINICAL DATA:  Malignant neoplasm of unspecified part of unspecified bronchus or lung. Non-small cell lung cancer. Adenocarcinoma with brain metastases. EXAM: CT NECK WITH CONTRAST TECHNIQUE: Multidetector CT imaging of the neck was performed using the standard protocol following the bolus administration of intravenous contrast. CONTRAST:  100 mL Isovue 300 COMPARISON:  CT neck with contrast 04/02/2017. FINDINGS: Pharynx and larynx: No focal mucosal or submucosal lesions are present. The nasopharynx and oropharynx are clear. Palatine and lingual tonsils are within normal limits. The epiglottis is normal. The hypopharynx is normal. Vocal cords are midline and symmetric. The visualized trachea is within normal limits. Salivary glands: The submandibular and parotid glands are normal bilaterally. Thyroid: Right thyroidectomy is present. Multiple small nodules are present in the left thyroid. No dominant lesion is present. The findings are stable. Lymph nodes: No significant cervical adenopathy is present. Vascular: Minimal atherosclerotic changes are present at the carotid bifurcations without significant stenosis. Limited intracranial: Within normal limits. Visualized orbits:  Unremarkable. Mastoids and visualized paranasal sinuses: Clear Skeleton: Vertebral body heights alignment are maintained. Exaggerated cervical lordosis compensates for thoracic kyphosis. Upper chest: The lung apices are clear. Please see dedicated CT of the chest from the same day. IMPRESSION: 1. No evidence for metastatic disease to the neck. 2. Right thyroidectomy. 3. Exaggerated cervical lordosis without focal bone disease. Electronically Signed   By: San Morelle M.D.   On: 10/04/2017 16:16   Ct Chest W Contrast  Result Date: 10/04/2017 CLINICAL DATA:  Metastatic lung cancer with brain metastases EXAM: CT CHEST, ABDOMEN, AND PELVIS WITH CONTRAST TECHNIQUE: Multidetector CT imaging of the chest, abdomen and pelvis was performed following the standard protocol  during bolus administration of intravenous contrast. CONTRAST:  100 cc Isovue 300 COMPARISON:  04/02/2017. FINDINGS: CT CHEST FINDINGS Cardiovascular: Heart is enlarged. Coronary artery calcification is evident. Atherosclerotic calcification is noted in the wall of the thoracic aorta. Mediastinum/Nodes: No mediastinal lymphadenopathy. Patulous esophagus again noted. There is no hilar lymphadenopathy. Surgical changes evident in the left hilum. There is no axillary lymphadenopathy. Lungs/Pleura: Soft tissue attenuation in the right suprahilar region with airway impaction is similar to prior. Peripheral right upper lobe nodule on image 46 is stable. 3 mm right lower lobe nodule (image 90) is unchanged. Stable 5 mm left lung nodule on image 69 anterior left lung nodule seen on image 44 measures about 6 mm, stable. Scarring and radiation change noted left parahilar lung, as before tiny chronic left pleural effusions similar to prior. Musculoskeletal: Bone windows reveal no worrisome lytic or sclerotic osseous lesions. CT ABDOMEN PELVIS FINDINGS Hepatobiliary: No focal abnormality within the liver parenchyma. There is no evidence for gallstones,  gallbladder wall thickening, or pericholecystic fluid. No intrahepatic or extrahepatic biliary dilation. Pancreas: No focal mass lesion. No dilatation of the main duct. No intraparenchymal cyst. No peripancreatic edema. Spleen: No splenomegaly. No focal mass lesion. Adrenals/Urinary Tract: No adrenal nodule or mass. Kidneys unremarkable. 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 The urinary bladder appears normal for the degree of distention. Stomach/Bowel: Stomach is nondistended. No gastric wall thickening. No evidence of outlet obstruction. Duodenum is normally positioned as is the ligament of Treitz. No small bowel wall thickening. No small bowel dilatation. The terminal ileum is normal. The appendix is normal. No gross colonic mass. No colonic wall thickening. No substantial diverticular change. Vascular/Lymphatic: No abdominal aortic aneurysm. There is no gastrohepatic or hepatoduodenal ligament lymphadenopathy. No intraperitoneal or retroperitoneal lymphadenopathy. No pelvic sidewall lymphadenopathy. Reproductive: The globular uterine configuration with heterogeneous attenuation. Endometrial fluid not excluded. There is no adnexal mass. Other: No intraperitoneal free fluid. Musculoskeletal: Bone windows reveal no worrisome lytic or sclerotic osseous lesions. Stable appearance mild superior endplate compression deformity at L4. New L1 compression fracture evident. IMPRESSION: 1. Stable exam of the chest. Postsurgical/post treatment changes in the left lung, stable. Small chronic left pleural effusion similar to prior. 2. Bilateral pulmonary nodules are similar to prior. No new or progressive pulmonary nodularity. 3. No evidence for metastatic disease in the abdomen or pelvis. 4. Interval development of L1 compression fracture. Stable mild compression deformity at L4. 5. Similar appearance of globular uterine configuration with heterogeneous  attenuation. Fluid in the endometrial canal not excluded. Electronically Signed   By: Misty Stanley M.D.   On: 10/04/2017 19:49   Mr Jeri Cos PY Contrast  Result Date: 10/01/2017 CLINICAL DATA:  Small cell lung cancer, staging. EXAM: MRI HEAD WITHOUT AND WITH CONTRAST TECHNIQUE: Multiplanar, multiecho pulse sequences of the brain and surrounding structures were obtained without and with intravenous contrast. CONTRAST:  32m MULTIHANCE GADOBENATE DIMEGLUMINE 529 MG/ML IV SOLN COMPARISON:  08/06/2016. 06/08/2015. FINDINGS: Study was performed on a 1.5 tesla scanner. Brain: No evidence for acute infarction, hemorrhage, mass lesion, hydrocephalus, or extra-axial fluid. Generalized cerebral and cerebellar atrophy. Extensive T2 and FLAIR hyperintensities throughout the white matter, likely combination of small vessel disease and post treatment effect. Status post LEFT occipital craniotomy for brain tumor removal. Previous enhancing nodule at the surgical site is redemonstrated, 4 mm in longest dimension, decreased from priors. No new areas of concern. Dural enhancement, expected postoperative finding. Vascular: Normal flow voids.  Skull and upper cervical spine: Normal marrow signal. Sinuses/Orbits: Negative. Other: Trace mastoid effusions. IMPRESSION: Postsurgical changes, LEFT occipital lobe. Enhancing nodular density measures 4 mm, involuted from most recent priors, consistent with granulation tissue/scarring. No new areas of concern to suggest interval metastatic disease. Advanced atrophy with extensive white matter signal abnormality likely combination of small vessel disease and post treatment effect. Electronically Signed   By: Staci Righter M.D.   On: 10/01/2017 11:33   Ct Abdomen Pelvis W Contrast  Result Date: 10/04/2017 CLINICAL DATA:  Metastatic lung cancer with brain metastases EXAM: CT CHEST, ABDOMEN, AND PELVIS WITH CONTRAST TECHNIQUE: Multidetector CT imaging of the chest, abdomen and pelvis was  performed following the standard protocol during bolus administration of intravenous contrast. CONTRAST:  100 cc Isovue 300 COMPARISON:  04/02/2017. FINDINGS: CT CHEST FINDINGS Cardiovascular: Heart is enlarged. Coronary artery calcification is evident. Atherosclerotic calcification is noted in the wall of the thoracic aorta. Mediastinum/Nodes: No mediastinal lymphadenopathy. Patulous esophagus again noted. There is no hilar lymphadenopathy. Surgical changes evident in the left hilum. There is no axillary lymphadenopathy. Lungs/Pleura: Soft tissue attenuation in the right suprahilar region with airway impaction is similar to prior. Peripheral right upper lobe nodule on image 46 is stable. 3 mm right lower lobe nodule (image 90) is unchanged. Stable 5 mm left lung nodule on image 69 anterior left lung nodule seen on image 44 measures about 6 mm, stable. Scarring and radiation change noted left parahilar lung, as before tiny chronic left pleural effusions similar to prior. Musculoskeletal: Bone windows reveal no worrisome lytic or sclerotic osseous lesions. CT ABDOMEN PELVIS FINDINGS Hepatobiliary: No focal abnormality within the liver parenchyma. There is no evidence for gallstones, gallbladder wall thickening, or pericholecystic fluid. No intrahepatic or extrahepatic biliary dilation. Pancreas: No focal mass lesion. No dilatation of the main duct. No intraparenchymal cyst. No peripancreatic edema. Spleen: No splenomegaly. No focal mass lesion. Adrenals/Urinary Tract: No adrenal nodule or mass. Kidneys unremarkable. 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 The urinary bladder appears normal for the degree of distention. Stomach/Bowel: Stomach is nondistended. No gastric wall thickening. No evidence of outlet obstruction. Duodenum is normally positioned as is the ligament of Treitz. No small bowel wall thickening. No small bowel dilatation. The  terminal ileum is normal. The appendix is normal. No gross colonic mass. No colonic wall thickening. No substantial diverticular change. Vascular/Lymphatic: No abdominal aortic aneurysm. There is no gastrohepatic or hepatoduodenal ligament lymphadenopathy. No intraperitoneal or retroperitoneal lymphadenopathy. No pelvic sidewall lymphadenopathy. Reproductive: The globular uterine configuration with heterogeneous attenuation. Endometrial fluid not excluded. There is no adnexal mass. Other: No intraperitoneal free fluid. Musculoskeletal: Bone windows reveal no worrisome lytic or sclerotic osseous lesions. Stable appearance mild superior endplate compression deformity at L4. New L1 compression fracture evident. IMPRESSION: 1. Stable exam of the chest. Postsurgical/post treatment changes in the left lung, stable. Small chronic left pleural effusion similar to prior. 2. Bilateral pulmonary nodules are similar to prior. No new or progressive pulmonary nodularity. 3. No evidence for metastatic disease in the abdomen or pelvis. 4. Interval development of L1 compression fracture. Stable mild compression deformity at L4. 5. Similar appearance of globular uterine configuration with heterogeneous attenuation. Fluid in the endometrial canal not excluded. Electronically Signed   By: Misty Stanley M.D.   On: 10/04/2017 19:49    ASSESSMENT AND PLAN: This is a very pleasant 72 years old African-American female with metastatic non-small cell lung cancer, adenocarcinoma  with positive EGFR mutation status post several years of treatment with Tarceva and the patient developed positive resistant mutation, T790M. The patient is currently on treatment with Tagrisso status post 34 months.  The patient continues to tolerate this treatment fairly well except for a few episodes of diarrhea managed with Imodium. She had repeat CT scan of the neck, chest, abdomen and pelvis as well as MRI of the brain performed recently.  I personally and  independently reviewed the scans and discussed the results with the patient and her husband. Her scan showed no evidence for disease progression. I recommended for the patient to continue her current treatment with Tagrisso with the same dose. I will see her back for follow-up visit in 2 months for evaluation with repeat CBC and comprehensive metabolic panel. The patient was advised to call immediately if she has any concerning symptoms in the interval. The patient voices understanding of current disease status and treatment options and is in agreement with the current care plan. All questions were answered. The patient knows to call the clinic with any problems, questions or concerns. We can certainly see the patient much sooner if necessary.  Disclaimer: This note was dictated with voice recognition software. Similar sounding words can inadvertently be transcribed and may not be corrected upon review.

## 2017-10-15 NOTE — Telephone Encounter (Signed)
Printed avs and calender of upcoming appointment. Per 2/26 los

## 2017-11-01 MED FILL — TAGRISSO 80 MG TABLET: 80 | 30 days supply | Qty: 30 | Fill #1

## 2017-11-21 ENCOUNTER — Telehealth: Payer: Self-pay | Admitting: Internal Medicine

## 2017-11-21 NOTE — Telephone Encounter (Signed)
MM call day switch. Moved 4/23 f/u to 5/2 and added lab (see order). Not able to reach patient by phone or leave message. Updated scheduled mailed.

## 2017-11-28 ENCOUNTER — Telehealth: Payer: Self-pay | Admitting: Internal Medicine

## 2017-11-28 MED FILL — TAGRISSO 80 MG TABLET: 80 | 30 days supply | Qty: 30 | Fill #2

## 2017-11-28 NOTE — Telephone Encounter (Signed)
Tried to call patient regarding voicemail

## 2017-12-10 ENCOUNTER — Ambulatory Visit: Payer: Medicare Other | Admitting: Internal Medicine

## 2017-12-11 IMAGING — CT CT CHEST W/ CM
4 of 9 series · 10 of 46 positions shown, 17 images · IV contrast (ISOVUE)
Comparison: 08/24/2015

CLINICAL DATA: Metastatic non-small-cell lung cancer, Tarceva
ongoing, status post gamma knife in 4889 and XRT in 4515. Thyroid
cancer in 6204, status post surgery. Pneumonia 1 month ago,
diarrhea, overactive bladder.

EXAM:
CT CHEST, ABDOMEN, AND PELVIS WITH CONTRAST
TECHNIQUE: Multidetector CT imaging of the chest, abdomen and pelvis was
performed following the standard protocol during bolus
administration of intravenous contrast.
CONTRAST:  100mL [id number] IOPAMIDOL ([id number]) INJECTION 61%

[Series 2: cap st · axial · 0.75mm/px · z∈[-722,-262]mm · 5 of 138 slices shown, 10 images]
[im 23/138  soft-tissue]
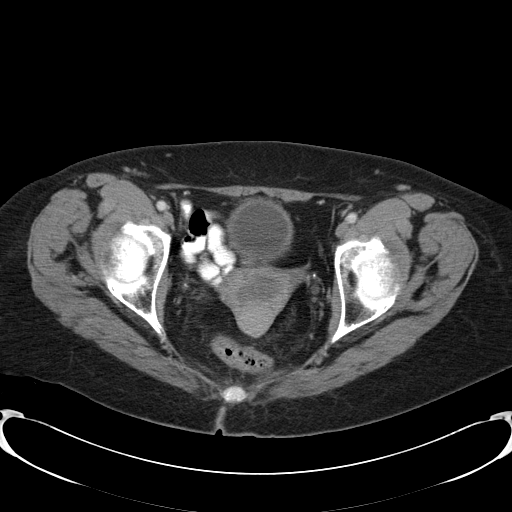
[im 23/138  bone]
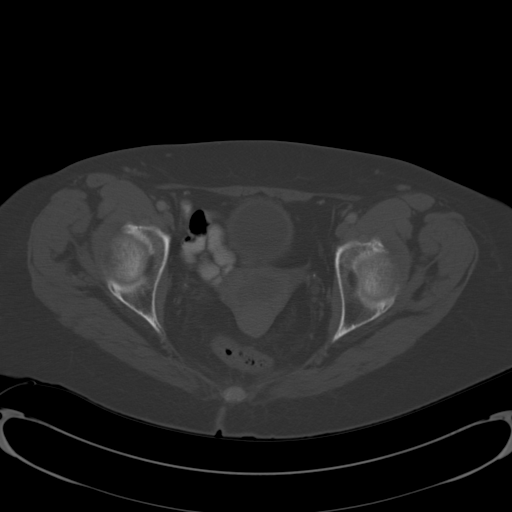
[im 46/138  soft-tissue]
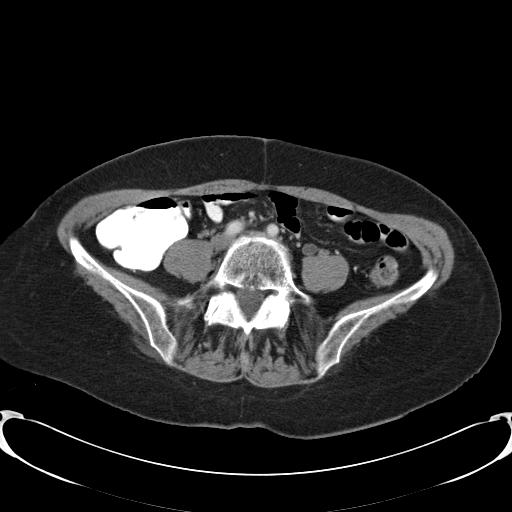
[im 46/138  lung]
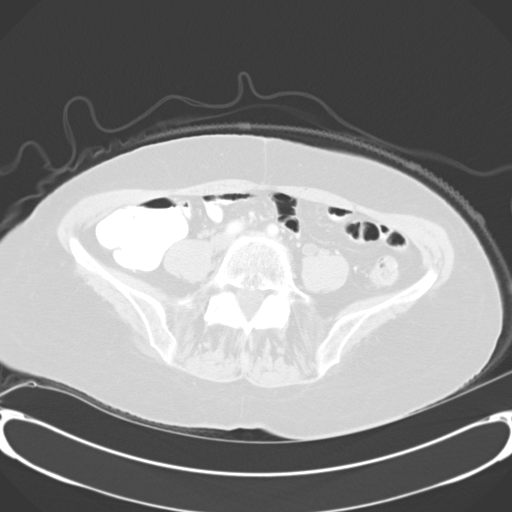
[im 69/138  soft-tissue]
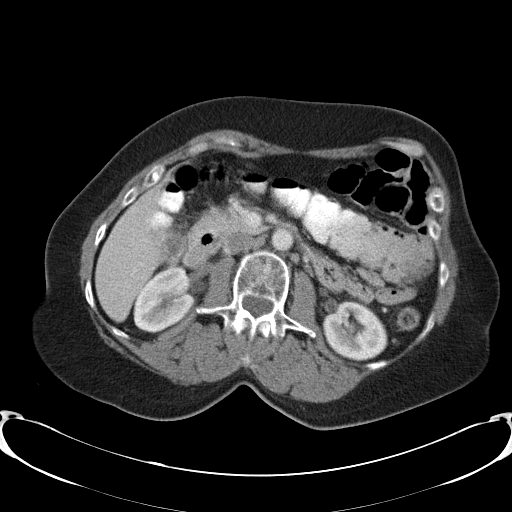
[im 69/138  lung]
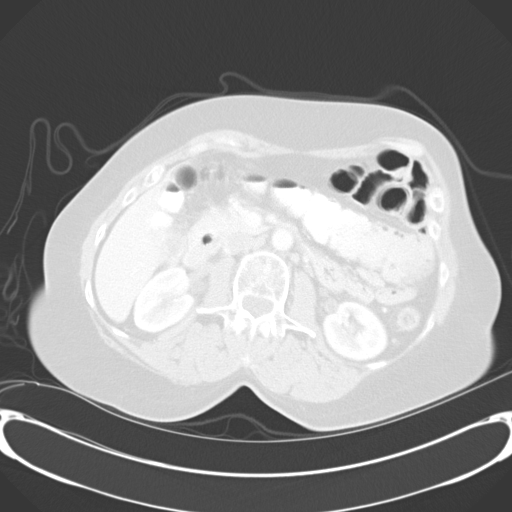
[im 92/138  soft-tissue]
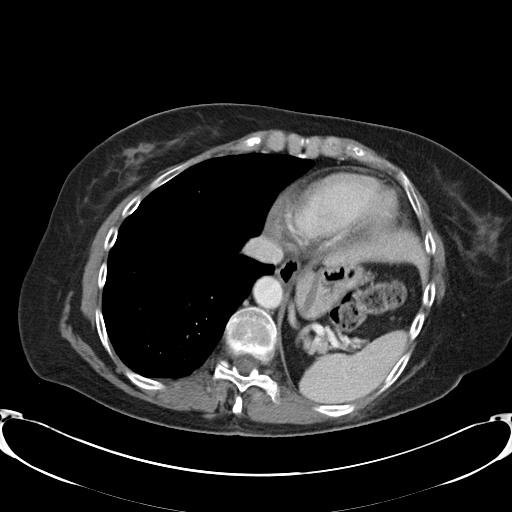
[im 92/138  lung]
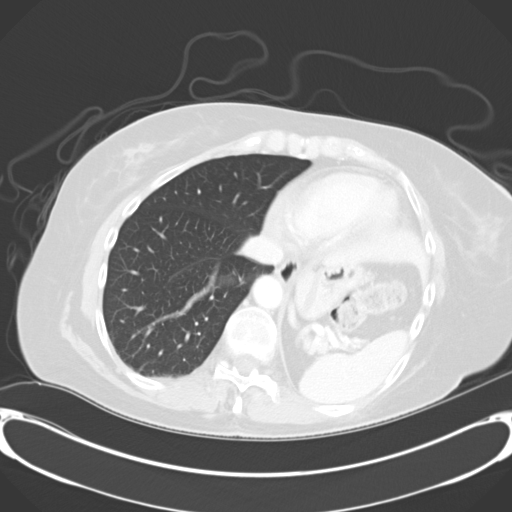
[im 115/138  soft-tissue]
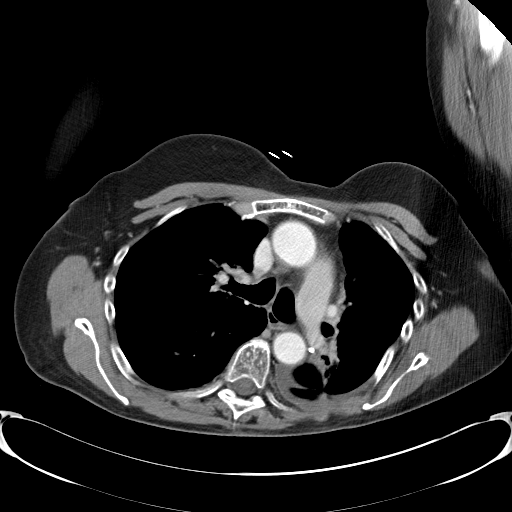
[im 115/138  lung]
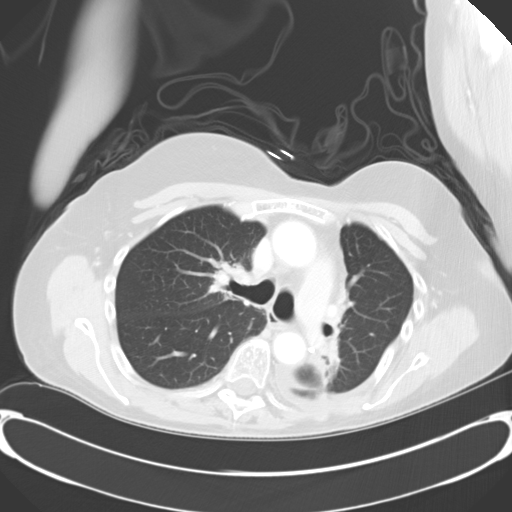

[Series 603: <mpr thick range(1)> · coronal · 0.53mm/px · 2 of 98 slices shown, 3 images]
[im 33/98  soft-tissue]
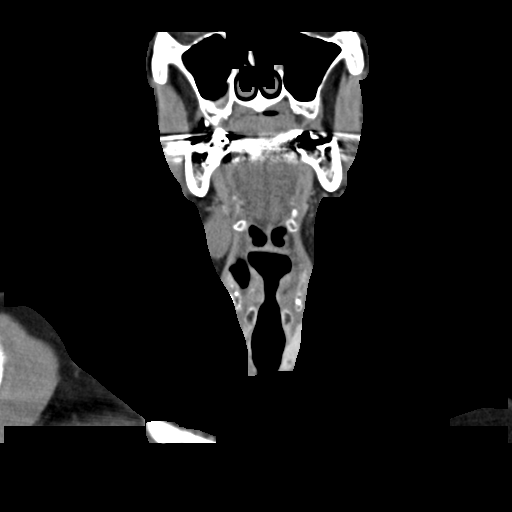
[im 33/98  bone]
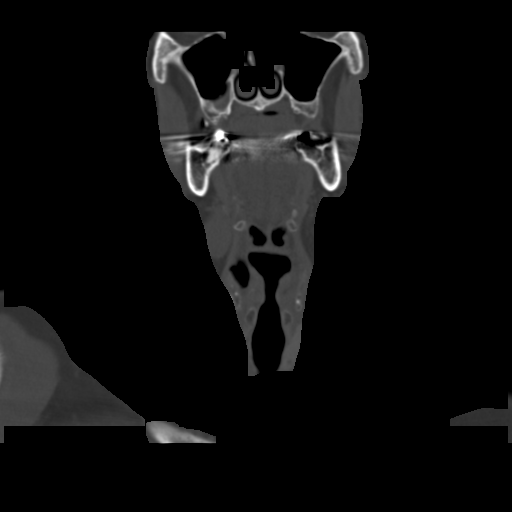
[im 65/98  soft-tissue]
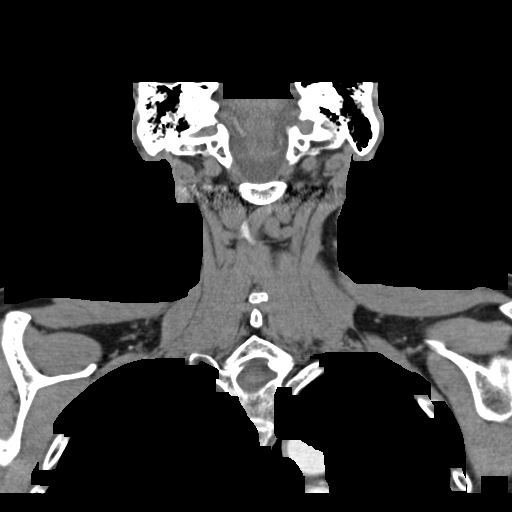

[Series 604: <mpr thick range(2)> · axial · 0.53mm/px · z∈[-264,-228]mm · 2 of 134 slices shown]
[im 20/134  soft-tissue]
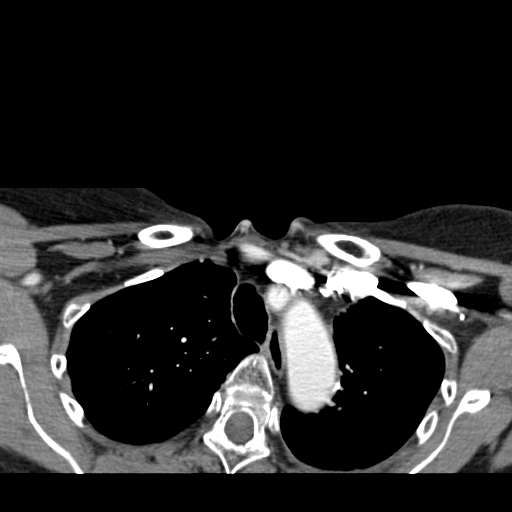
[im 39/134  soft-tissue]
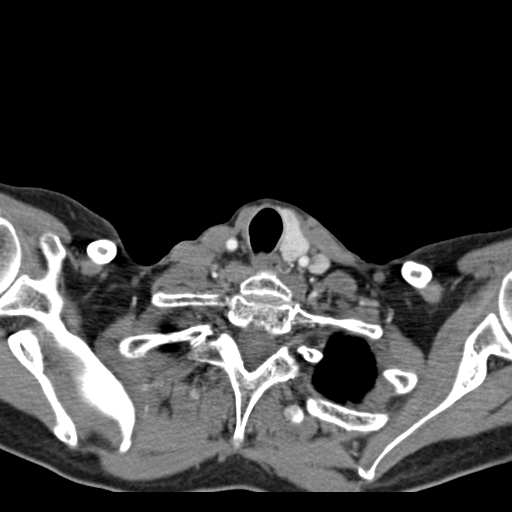

[Series 606: <mpr thick range(4)> · sagittal · 1.35mm/px · 1 of 183 slices shown, 2 images]
[im 92/183  soft-tissue]
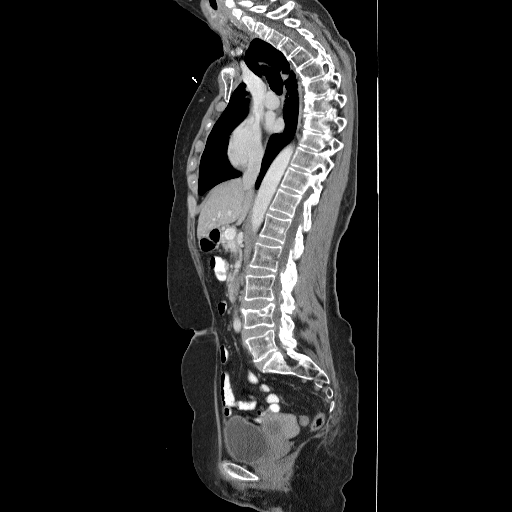
[im 92/183  bone]
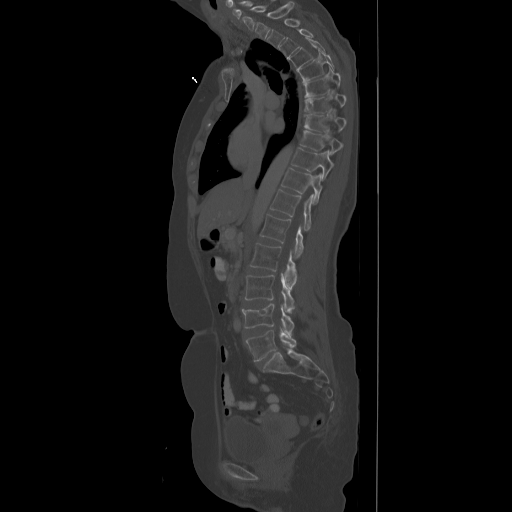

[10 of 46 positions shown; findings below may reference images not displayed]

FINDINGS: CT CHEST FINDINGS

Mediastinum/Nodes: The heart is top-normal in size. No pericardial
effusion.

No evidence of thoracic aortic aneurysm.

No suspicious mediastinal lymphadenopathy.

Status post right thyroidectomy.

Lungs/Pleura: Status post left lower lobectomy with associated
volume loss. Radiation changes in the left paramediastinal region.
Small left pleural effusion.

Focal radiation changes/opacity along the medial right upper lobe
(series 9/ image 54). While this is mildly more conspicuous on axial
imaging when compared to the recent prior, on coronal imaging
(series 605/ image 85) this is entirely unchanged.

4 mm triangular subpleural nodule in the lateral right upper lobe
(series 9/image 53), grossly unchanged. 5 mm nodule in the left
lower lobe (series 9/ image 39), obscured by prior patchy left lower
lobe opacity, possibly post infectious.

Underlying mild emphysematous changes.

No pneumothorax.

Musculoskeletal: Mild degenerative changes of the mid thoracic
spine.

Stable sternal deformity with buckling (series 606/ image 103).

CT ABDOMEN PELVIS FINDINGS

Hepatobiliary: Liver is notable for a 4 mm cyst in the left hepatic
lobe (series 2/ image 57). No suspicious/enhancing hepatic lesions.

Gallbladder is grossly unremarkable, noting motion degradation.

Pancreas: Grossly unremarkable.

Spleen: Within normal limits

Adrenals/Urinary Tract: Mild thickening of the left adrenal gland,
without discrete mass.

Right adrenal gland is within normal limits.

Kidneys are within normal limits, noting motion degradation. No
hydronephrosis.

Bladder is mildly thick-walled although underdistended.

Stomach/Bowel: Stomach is grossly unremarkable.

Visualized bowel is within normal limits.

Normal appendix (series 2/ image 105).

Vascular/Lymphatic: No evidence of abdominal aortic aneurysm.

No suspicious abdominopelvic lymphadenopathy.

Reproductive: Stable heterogeneous appearance of the retroverted
uterus (series 606/ image 89), suggesting uterine fibroids.

Bilateral ovaries are within normal limits.

Other: No abdominopelvic ascites.

Musculoskeletal: Mild to moderate superior endplate compression
fracture deformity at L2.

Mild degenerative changes of the lower lumbar spine.
IMPRESSION: Status post left lower lobectomy. Stable postradiation changes in
the left paramediastinal region. Small left pleural effusion.

Prior patchy left lower lobe opacity has resolved, with a residual 5
mm left lower lobe nodule, attention on follow-up suggested.

Stable focal radiation changes in the right upper lobe.

No findings suspicious for metastatic disease.

Status post right thyroidectomy.

Additional stable ancillary findings as above.

## 2017-12-19 ENCOUNTER — Inpatient Hospital Stay: Payer: Medicare Other

## 2017-12-19 ENCOUNTER — Inpatient Hospital Stay: Payer: Medicare Other | Attending: Internal Medicine | Admitting: Internal Medicine

## 2017-12-19 ENCOUNTER — Encounter: Payer: Self-pay | Admitting: Internal Medicine

## 2017-12-19 ENCOUNTER — Telehealth: Payer: Self-pay | Admitting: Internal Medicine

## 2017-12-19 VITALS — BP 126/67 | HR 76 | Temp 98.6°F | Resp 17 | Ht 67.0 in | Wt 171.6 lb

## 2017-12-19 DIAGNOSIS — C3411 Malignant neoplasm of upper lobe, right bronchus or lung: Secondary | ICD-10-CM

## 2017-12-19 DIAGNOSIS — Z7901 Long term (current) use of anticoagulants: Secondary | ICD-10-CM | POA: Diagnosis not present

## 2017-12-19 DIAGNOSIS — Z79899 Other long term (current) drug therapy: Secondary | ICD-10-CM | POA: Diagnosis not present

## 2017-12-19 DIAGNOSIS — R531 Weakness: Secondary | ICD-10-CM

## 2017-12-19 DIAGNOSIS — C7931 Secondary malignant neoplasm of brain: Secondary | ICD-10-CM | POA: Insufficient documentation

## 2017-12-19 LAB — CMP (CANCER CENTER ONLY)
ALT: 10 U/L (ref 0–55)
AST: 12 U/L (ref 5–34)
Albumin: 4.5 g/dL (ref 3.5–5.0)
Alkaline Phosphatase: 66 U/L (ref 40–150)
Anion gap: 10 (ref 3–11)
BUN: 16 mg/dL (ref 7–26)
CO2: 24 mmol/L (ref 22–29)
Calcium: 10.3 mg/dL (ref 8.4–10.4)
Chloride: 106 mmol/L (ref 98–109)
Creatinine: 1 mg/dL (ref 0.60–1.10)
GFR, Est AFR Am: >60 mL/min (ref >60)
GFR, Estimated: 55 mL/min — ABNORMAL LOW (ref >60)
Glucose, Bld: 92 mg/dL (ref 70–140)
Potassium: 3.9 mmol/L (ref 3.5–5.1)
Sodium: 140 mmol/L (ref 136–145)
Total Bilirubin: 0.5 mg/dL (ref 0.2–1.2)
Total Protein: 7.1 g/dL (ref 6.4–8.3)

## 2017-12-19 LAB — CBC WITH DIFFERENTIAL (CANCER CENTER ONLY)
Basophils Absolute: 0 10*3/uL (ref 0.0–0.1)
Basophils Relative: 1 %
Eosinophils Absolute: 0 10*3/uL (ref 0.0–0.5)
Eosinophils Relative: 1 %
HCT: 40 % (ref 34.8–46.6)
Hemoglobin: 13.2 g/dL (ref 11.6–15.9)
Lymphocytes Relative: 16 %
Lymphs Abs: 0.7 10*3/uL — ABNORMAL LOW (ref 0.9–3.3)
MCH: 30 pg (ref 25.1–34.0)
MCHC: 33 g/dL (ref 31.5–36.0)
MCV: 90.9 fL (ref 79.5–101.0)
Monocytes Absolute: 0.4 10*3/uL (ref 0.1–0.9)
Monocytes Relative: 8 %
Neutro Abs: 3.5 10*3/uL (ref 1.5–6.5)
Neutrophils Relative %: 74 %
Platelet Count: 206 10*3/uL (ref 145–400)
RBC: 4.4 MIL/uL (ref 3.70–5.45)
RDW: 14.4 % (ref 11.2–14.5)
WBC Count: 4.7 10*3/uL (ref 3.9–10.3)

## 2017-12-19 NOTE — Telephone Encounter (Signed)
Appt scheduled AVS/Calendar printed per 5/2 los

## 2017-12-19 NOTE — Progress Notes (Signed)
[facility] Telephone:(336) [phone]   Fax:([phone]  OFFICE PROGRESS NOTE  [name], [address] [place] [address]  PRINCIPAL DIAGNOSIS:  metastatic non-small cell lung cancer, adenocarcinoma with brain metastases initially diagnosed as a stage IIb in October 2002.   BIOMARKERS TESTING ([facility] One): Positive for: EGFR E746-A750 del, T790M, CTNNB1 D32G, SMAD4 T15f*10 Negative for: RET, ALK, BRAF, KRAS, ERBB2 and MET  PRIOR THERAPY:  1. Status post left lower lobectomy under the care of Dr BArlyce Dicein October 2002. 2. Status post course of concurrent chemoradiation with weekly carboplatin and paclitaxel under the care of Dr SBenay Spiceand Dr WElba Barmanin early 2003. 3. Status post treatment with Celebrex and Iressa [facility] 4. Status post left occipital craniotomy for tumor resection on September 21, 2005, under the care of Dr CChristella Noa 5. Status post whole brain irradiation under the care of Dr WElba Barman completed November 07, 2005. 6. Status post gamma knife [facility]. Status post right thyroidectomy with radical neck dissection under the care of Dr. RConstance Holsteron 10/18/2011. 8. Status post excisional biopsy of right cervical lymph node that was consistent with metastatic adenocarcinoma. 9. Tarceva 150 mg by mouth daily with therapy beginning 06/03/2007, discontinued in July 2015 secondary to disease progression. Status post 79 cycles. 10. palliative radiotherapy to the enlarging right upper lobe lung nodule under the care of Dr. MValere Dross [facility]. [id number] 300 mg by mouth daily for 21 days discontinued secondary to study deviation and [facility]. 12. Open reduction and  internal fixation of trimalleolar ankle fracture dislocation with fixation of the fibula as well as medial malleolus.   CURRENT THERAPY: Tagrisso 80 mg by mouth daily started 11/25/2014, status post 36 months of treatment.  INTERVAL HISTORY: Valerie FELBER89y.o. female returns to the clinic today for follow-up visit accompanied by her husband.  The patient is feeling fine today with no concerning complaints except for the generalized weakness.  She denied having any chest pain, shortness of breath, cough or hemoptysis.  She denied having any fever or chills.  She has no nausea, vomiting, diarrhea or constipation.  She continues to tolerate her treatment with Tagrisso fairly well.  She is here today for evaluation and repeat blood work.   MEDICAL HISTORY: Past Medical History:  Diagnosis Date  . Abnormal Pap smear 2006  . Anal fistula   . Ankle fracture   . Anxiety   . Arthritis   . Atrophic vaginitis 2008  . Cataract   . Dementia 2009  . Dyspareunia 2008  . H/O osteoporosis   . H/O varicella   . H/O vitamin D deficiency   . Headache 07/24/2016  . Heart murmur   . History of measles, mumps, or rubella   . History of radiation therapy 07/28/13- 08/10/13   right lung metastasis 5000 cGy 10 sessions  . Hypertension   . [place]  . Lung cancer ([facility]   . Lung cancer ([facility]   . Lung metastasis ([facility]    PET scan 05/05/13, RUL lung nodule  . [facility]astasis [facility] dx'd 2008  . Metastasis [place] lymph nodes ([place] dx'd 09/2011  .  Nodule of right lung CT- 06/03/12   RIGHT UPPER LOBE  . Nodule of right lung 06/03/12   Upper Lobe  . On antineoplastic chemotherapy    TARCEVA  . Osteoporosis 2010  . Primary cancer of right upper lobe of lung ([place]) 04/22/2009   Qualifier: Diagnosis of  By: Nils Pyle CMA ([place]), Mearl Latin    . Shortness of breath    hx lung ca   . Status post chemotherapy 2003   CARBOPLATIN/PACLITAXEL /STATUS POST CLINICAL TRAIL OF CELEBREX AND IRESSA AT  [facility] FOR [facility] YEAR  . Status post radiation therapy 2003   LEFT LUNG  . Status post radiation therapy 11/07/2005   WHOLE BRAIN: DR Larkin In[facility]  . Status post radiation therapy 06/02/2008   GAMMA KNIFE OF RESECTED CAVITAY  . Thyroid adenoma    ?  [name] Thyroid cancer ([place]) 10/18/11 bx   adenoid nodules   . Yeast infection     ALLERGIES:  has No Known Allergies.  MEDICATIONS:  Current Outpatient Medications  Medication Sig Dispense Refill  . Ascorbic Acid (VITAMIN C) 100 MG tablet Take 100 mg by mouth daily.    . cholecalciferol (VITAMIN D) 1000 UNITS tablet Take 1,000 Units by mouth at bedtime.    . donepezil (ARICEPT) 10 MG tablet Take 10 mg by mouth daily.  0  . loperamide (IMODIUM) 2 MG capsule Take 2 mg by mouth as needed for diarrhea or loose stools.     . Multiple Vitamin (MULTI-VITAMINS) TABS Take 1 tablet by mouth daily.     . nitrofurantoin, macrocrystal-monohydrate, (MACROBID) 100 MG capsule Take 1 capsule by mouth 2 (two) times daily.    [name] TAGRISSO 80 MG tablet TAKE 1 TABLET BY MOUTH DAILY. 30 tablet 2  . XARELTO 20 MG TABS tablet      No current facility-administered medications for this visit.     SURGICAL HISTORY:  Past Surgical History:  Procedure Laterality Date  . BRAIN SURGERY    . LEFT LOWER LOBECTOMY  05/2001  . LEFT OCCIPITAL CRANIOTOMY  09/21/2005   tumor  . LUNG LOBECTOMY    . NECK SURGERY    . ORIF ANKLE FRACTURE Left 10/02/2014   Procedure: OPEN REDUCTION INTERNAL FIXATION (ORIF) ANKLE FRACTURE;  Surgeon: Alta Corning, MD;  Location[facility];  Service: Orthopedics;  Laterality: Left;  . RADICAL NECK DISSECTION  10/18/2011   Procedure: RADICAL NECK DISSECTION;  Surgeon: Izora Gala, MD;  Location: [facility];  Service: ENT;  Laterality: Right;  RIGHT MODIFIED NECK DISSECTION /POSSIBLE RIGHT THYROIDECTOM  . THYROIDECTOMY  10/18/2011   Procedure: THYROIDECTOMY WITH RADICAL NECK DISSECTION;  Surgeon: Izora Gala, MD;  Location: [facility];  Service: ENT;  Laterality: Right;      REVIEW OF SYSTEMS:  A comprehensive review of systems was negative except for: Constitutional: positive for fatigue   PHYSICAL EXAMINATION: General appearance: alert, cooperative, fatigued and no distress Head: Normocephalic, without obvious abnormality, atraumatic Neck: no adenopathy, supple, symmetrical, trachea midline and thyroid not enlarged, symmetric, no tenderness/mass/nodules Lymph nodes: Cervical, supraclavicular, and axillary nodes normal. Resp: clear to auscultation bilaterally Back: symmetric, no curvature. ROM normal. No CVA tenderness. Cardio: regular rate and rhythm, S1, S2 normal, no murmur, click, rub or gallop GI: soft, non-tender; bowel sounds normal; no masses,  no organomegaly Extremities: extremities normal, atraumatic, no cyanosis or edema  ECOG PERFORMANCE STATUS: 1 - Symptomatic but completely ambulatory  Blood pressure 126/67, pulse 76, temperature 98.6 F (37 C), temperature source Oral, resp. rate 17,  height _0  (1.702 m), weight 171 lb 9.6 oz (77.8 kg), SpO2 100 %.  LABORATORY DATA: Lab Results  Component Value Date   WBC 4.7 12/19/2017   HGB 13.2 12/19/2017   HCT 40.0 12/19/2017   MCV 90.9 12/19/2017   PLT 206 12/19/2017      Chemistry      Component Value Date/Time   NA 139 10/04/2017 1342   NA 141 08/06/2017 1047   K 3.6 10/04/2017 1342   K 3.9 08/06/2017 1047   CL 103 10/04/2017 1342   CL 106 12/09/2012 0806   CO2 26 10/04/2017 1342   CO2 24 08/06/2017 1047   BUN 13 10/04/2017 1342   BUN 15.4 08/06/2017 1047   CREATININE 0.92 10/04/2017 1342   CREATININE 1.0 08/06/2017 1047      Component Value Date/Time   CALCIUM 10.0 10/04/2017 1342   CALCIUM 9.8 08/06/2017 1047   ALKPHOS 62 10/04/2017 1342   ALKPHOS 65 08/06/2017 1047   AST 12 10/04/2017 1342   AST 11 08/06/2017 1047   ALT 8 10/04/2017 1342   ALT 9 08/06/2017 1047   BILITOT 0.8 10/04/2017 1342   BILITOT 0.66 08/06/2017 1047       RADIOGRAPHIC STUDIES: No results  found.  ASSESSMENT AND PLAN: This is a very pleasant 72 years old African-American female with metastatic non-small cell lung cancer, adenocarcinoma with positive EGFR mutation status post several years of treatment with Tarceva and the patient developed positive resistant mutation, T790M. The patient is currently on treatment with Tagrisso status post 36 months.  She has been tolerating this treatment well with no concerning complaints. I recommended for her to continue her current treatment with Tagrisso with the same dose 80 mg p.o. Daily. I will see her back for follow-up visit in 2 months for evaluation with repeat blood work.  We will continue to do her imaging studies on every 6 months basis. She was advised to call immediately if she has any concerning symptoms in the interval. The patient voices understanding of current disease status and treatment options and is in agreement with the current care plan. All questions were answered. The patient knows to call the clinic with any problems, questions or concerns. We can certainly see the patient much sooner if necessary.  Disclaimer: This note was dictated with voice recognition software. Similar sounding words can inadvertently be transcribed and may not be corrected upon review.

## 2017-12-23 ENCOUNTER — Other Ambulatory Visit: Payer: Self-pay | Admitting: Internal Medicine

## 2017-12-23 DIAGNOSIS — C3411 Malignant neoplasm of upper lobe, right bronchus or lung: Secondary | ICD-10-CM

## 2017-12-26 MED FILL — TAGRISSO 80 MG TABLET: 80 | 30 days supply | Qty: 30 | Fill #0

## 2018-01-29 MED FILL — TAGRISSO 80 MG TABLET: 80 | 30 days supply | Qty: 30 | Fill #1

## 2018-02-24 ENCOUNTER — Encounter: Payer: Self-pay | Admitting: Internal Medicine

## 2018-02-24 ENCOUNTER — Inpatient Hospital Stay (HOSPITAL_BASED_OUTPATIENT_CLINIC_OR_DEPARTMENT_OTHER): Payer: Medicare Other | Admitting: Internal Medicine

## 2018-02-24 ENCOUNTER — Inpatient Hospital Stay: Payer: Medicare Other | Attending: Internal Medicine

## 2018-02-24 ENCOUNTER — Telehealth: Payer: Self-pay

## 2018-02-24 VITALS — BP 118/69 | HR 89 | Temp 98.4°F | Resp 18 | Ht 67.0 in | Wt 173.8 lb

## 2018-02-24 DIAGNOSIS — C3490 Malignant neoplasm of unspecified part of unspecified bronchus or lung: Secondary | ICD-10-CM

## 2018-02-24 DIAGNOSIS — K529 Noninfective gastroenteritis and colitis, unspecified: Secondary | ICD-10-CM | POA: Diagnosis not present

## 2018-02-24 DIAGNOSIS — C3411 Malignant neoplasm of upper lobe, right bronchus or lung: Secondary | ICD-10-CM | POA: Insufficient documentation

## 2018-02-24 DIAGNOSIS — C7931 Secondary malignant neoplasm of brain: Secondary | ICD-10-CM | POA: Diagnosis not present

## 2018-02-24 DIAGNOSIS — M818 Other osteoporosis without current pathological fracture: Secondary | ICD-10-CM

## 2018-02-24 DIAGNOSIS — Z5111 Encounter for antineoplastic chemotherapy: Secondary | ICD-10-CM

## 2018-02-24 DIAGNOSIS — C719 Malignant neoplasm of brain, unspecified: Secondary | ICD-10-CM

## 2018-02-24 LAB — CMP (CANCER CENTER ONLY)
ALT: 12 U/L (ref 0–44)
AST: 13 U/L — ABNORMAL LOW (ref 15–41)
Albumin: 4.3 g/dL (ref 3.5–5.0)
Alkaline Phosphatase: 63 U/L (ref 38–126)
Anion gap: 7 (ref 5–15)
BUN: 16 mg/dL (ref 8–23)
CO2: 29 mmol/L (ref 22–32)
Calcium: 10.1 mg/dL (ref 8.9–10.3)
Chloride: 107 mmol/L (ref 98–111)
Creatinine: 1.12 mg/dL — ABNORMAL HIGH (ref 0.44–1.00)
GFR, Est AFR Am: 55 mL/min — ABNORMAL LOW (ref >60)
GFR, Estimated: 48 mL/min — ABNORMAL LOW (ref >60)
Glucose, Bld: 104 mg/dL — ABNORMAL HIGH (ref 70–99)
Potassium: 3.8 mmol/L (ref 3.5–5.1)
Sodium: 143 mmol/L (ref 135–145)
Total Bilirubin: 0.4 mg/dL (ref 0.3–1.2)
Total Protein: 6.8 g/dL (ref 6.5–8.1)

## 2018-02-24 LAB — CBC WITH DIFFERENTIAL (CANCER CENTER ONLY)
Basophils Absolute: 0 10*3/uL (ref 0.0–0.1)
Basophils Relative: 0 %
Eosinophils Absolute: 0.1 10*3/uL (ref 0.0–0.5)
Eosinophils Relative: 2 %
HCT: 39.4 % (ref 34.8–46.6)
Hemoglobin: 12.7 g/dL (ref 11.6–15.9)
Lymphocytes Relative: 18 %
Lymphs Abs: 0.8 10*3/uL — ABNORMAL LOW (ref 0.9–3.3)
MCH: 30 pg (ref 25.1–34.0)
MCHC: 32.2 g/dL (ref 31.5–36.0)
MCV: 92.9 fL (ref 79.5–101.0)
Monocytes Absolute: 0.5 10*3/uL (ref 0.1–0.9)
Monocytes Relative: 11 %
Neutro Abs: 3.1 10*3/uL (ref 1.5–6.5)
Neutrophils Relative %: 69 %
Platelet Count: 197 10*3/uL (ref 145–400)
RBC: 4.24 MIL/uL (ref 3.70–5.45)
RDW: 14.1 % (ref 11.2–14.5)
WBC Count: 4.5 10*3/uL (ref 3.9–10.3)

## 2018-02-24 NOTE — Telephone Encounter (Signed)
Printed avs and calender of upcoming appointment. Per 7/8 los. CT is already scheduled and patient will do water based contrast provider by Radiology Dept.

## 2018-02-24 NOTE — Progress Notes (Signed)
[facility] Telephone:(336) [phone]   Fax:(336) [phone]  OFFICE PROGRESS NOTE  [name], [address] [place] [address]  PRINCIPAL DIAGNOSIS:  metastatic non-small cell lung cancer, adenocarcinoma with brain metastases initially diagnosed as a stage IIb in October 2002.   BIOMARKERS TESTING ([facility] One): Positive for: EGFR E746-A750 del, T790M, CTNNB1 D32G, SMAD4 T159f*10 Negative for: RET, ALK, BRAF, KRAS, ERBB2 and MET  PRIOR THERAPY:  1. Status post left lower lobectomy under the care of Dr BArlyce Dicein October 2002. 2. Status post course of concurrent chemoradiation with weekly carboplatin and paclitaxel under the care of Dr SBenay Spiceand Dr WElba Barmanin early 2003. 3. Status post treatment with Celebrex and [facility]a [facility] 4. Status post left occipital craniotomy for tumor resection on September 21, 2005, under the care of Dr CChristella Noa 5. Status post whole brain irradiation under the care of Dr WElba Barman completed November 07, 2005. 6. Status post gamma knife treatment [facility] the [facility]. Status post right thyroidectomy with radical neck dissection under the care of Dr. RConstance Holsteron 10/18/2011. 8. Status post excisional biopsy of right cervical lymph node that was consistent with metastatic adenocarcinoma. 9. Tarceva 150 mg by mouth daily with therapy beginning 06/03/2007, discontinued in July 2015 secondary to disease progression. Status post 79 cycles. 10. palliative radiotherapy to the enlarging right upper lobe lung nodule under the care of Dr. MValere Dross 11. [id number] 300 mg by mouth daily for 21 days discontinued secondary to study deviation and [facility]. 12. Open reduction and  internal fixation of trimalleolar ankle fracture dislocation with fixation of the fibula as well as medial malleolus.   CURRENT THERAPY: Tagrisso 80 mg by mouth daily started 11/25/2014, status post 38 months of treatment.  INTERVAL HISTORY: Valerie QUACKENBUSH72y.o. female returns to the clinic today for 2 months follow-up visit accompanied by her husband.  The patient is feeling fine today with no concerning complaints.  She continues to have few episodes of diarrhea with her current treatment with Tagrisso.  She denied having any skin rash.  She has no chest pain, shortness of breath, cough or hemoptysis.  She has no nausea or vomiting.  She is currently undergoing physical therapy for the weakness of her lower extremities.  The patient is here today for evaluation with repeat blood work.   MEDICAL HISTORY: Past Medical History:  Diagnosis Date  . Abnormal Pap smear 2006  . Anal fistula   . Ankle fracture   . Anxiety   . Arthritis   . Atrophic vaginitis 2008  . Cataract   . Dementia 2009  . Dyspareunia 2008  . H/O osteoporosis   . H/O varicella   . H/O vitamin D deficiency   . Headache 07/24/2016  . Heart murmur   . History of measles, mumps, or rubella   . History of radiation therapy 07/28/13- 08/10/13   right lung metastasis 5000 cGy 10 sessions  . Hypertension   . [place] ([facility] [place]  . Lung cancer ([facility]   . Lung cancer ([facility]   . Lung metastasis ([facility]    PET scan 05/05/13, RUL lung nodule  . Metastasis [facility] ([facility] dx'd  2008  . 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 Nodule of right lung CT- 06/03/12   RIGHT UPPER LOBE  . Nodule of right lung 06/03/12   Upper Lobe  . On antineoplastic chemotherapy    TARCEVA  . Osteoporosis 2010  . Primary cancer of right upper lobe of lung ([place]) 04/22/2009   Qualifier: Diagnosis of  By: Nils Pyle CMA ([place]), Mearl Latin    . Shortness of breath    hx lung ca   . Status post chemotherapy 2003   CARBOPLATIN/PACLITAXEL  /STATUS POST CLINICAL TRAIL OF CELEBREX AND IRESSA AT [facility] FOR 1 YEAR  . Status post radiation therapy 2003   LEFT LUNG  . Status post radiation therapy 11/07/2005   WHOLE BRAIN: DR Larkin In[facility]  . Status post radiation therapy 06/02/2008   GAMMA KNIFE OF RESECTED CAVITAY  . Thyroid adenoma    ?  [name] Thyroid cancer ([place]) 10/18/11 bx   adenoid nodules   . Yeast infection     ALLERGIES:  has No Known Allergies.  MEDICATIONS:  Current Outpatient Medications  Medication Sig Dispense Refill  . Ascorbic Acid (VITAMIN C) 100 MG tablet Take 100 mg by mouth daily.    . cholecalciferol (VITAMIN D) 1000 UNITS tablet Take 1,000 Units by mouth at bedtime.    . donepezil (ARICEPT) 10 MG tablet Take 10 mg by mouth daily.  0  . loperamide (IMODIUM) 2 MG capsule Take 2 mg by mouth as needed for diarrhea or loose stools.     . Multiple Vitamin (MULTI-VITAMINS) TABS Take 1 tablet by mouth daily.     . nitrofurantoin, macrocrystal-monohydrate, (MACROBID) 100 MG capsule Take 1 capsule by mouth 2 (two) times daily.    [name] TAGRISSO 80 MG tablet TAKE 1 TABLET BY MOUTH DAILY. 30 tablet 2  . XARELTO 20 MG TABS tablet      No current facility-administered medications for this visit.     SURGICAL HISTORY:  Past Surgical History:  Procedure Laterality Date  . BRAIN SURGERY    . LEFT LOWER LOBECTOMY  05/2001  . LEFT OCCIPITAL CRANIOTOMY  09/21/2005   tumor  . LUNG LOBECTOMY    . NECK SURGERY    . ORIF ANKLE FRACTURE Left 10/02/2014   Procedure: OPEN REDUCTION INTERNAL FIXATION (ORIF) ANKLE FRACTURE;  Surgeon: Alta Corning, MD;  [facility];  Service: Orthopedics;  Laterality: Left;  . RADICAL NECK DISSECTION  10/18/2011   Procedure: RADICAL NECK DISSECTION;  Surgeon: Izora Gala, MD;  Location: [place];  Service: ENT;  Laterality: Right;  RIGHT MODIFIED NECK DISSECTION /POSSIBLE RIGHT THYROIDECTOM  . THYROIDECTOMY  10/18/2011   Procedure: THYROIDECTOMY WITH RADICAL NECK DISSECTION;  Surgeon: Izora Gala, MD;   Location: [place];  Service: ENT;  Laterality: Right;    REVIEW OF SYSTEMS:  A comprehensive review of systems was negative except for: Gastrointestinal: positive for diarrhea Musculoskeletal: positive for muscle weakness   PHYSICAL EXAMINATION: General appearance: alert, cooperative and no distress Head: Normocephalic, without obvious abnormality, atraumatic Neck: no adenopathy, supple, symmetrical, trachea midline and thyroid not enlarged, symmetric, no tenderness/mass/nodules Lymph nodes: Cervical, supraclavicular, and axillary nodes normal. Resp: clear to auscultation bilaterally Back: symmetric, no curvature. ROM normal. No CVA tenderness. Cardio: regular rate and rhythm, S1, S2 normal, no murmur, click, rub or gallop GI: soft, non-tender; bowel sounds normal; no masses,  no organomegaly Extremities: extremities normal, atraumatic, no cyanosis or edema  ECOG PERFORMANCE STATUS: 1 - Symptomatic but completely ambulatory  Blood  pressure 118/69, pulse 89, temperature 98.4 F (36.9 C), temperature source Oral, resp. rate 18, height _0  (1.702 m), weight 173 lb 12.8 oz (78.8 kg), SpO2 100 %.  LABORATORY DATA: Lab Results  Component Value Date   WBC 4.5 02/24/2018   HGB 12.7 02/24/2018   HCT 39.4 02/24/2018   MCV 92.9 02/24/2018   PLT 197 02/24/2018      Chemistry      Component Value Date/Time   NA 140 12/19/2017 1103   NA 141 08/06/2017 1047   K 3.9 12/19/2017 1103   K 3.9 08/06/2017 1047   CL 106 12/19/2017 1103   CL 106 12/09/2012 0806   CO2 24 12/19/2017 1103   CO2 24 08/06/2017 1047   BUN 16 12/19/2017 1103   BUN 15.4 08/06/2017 1047   CREATININE 1.00 12/19/2017 1103   CREATININE 1.0 08/06/2017 1047      Component Value Date/Time   CALCIUM 10.3 12/19/2017 1103   CALCIUM 9.8 08/06/2017 1047   ALKPHOS 66 12/19/2017 1103   ALKPHOS 65 08/06/2017 1047   AST 12 12/19/2017 1103   AST 11 08/06/2017 1047   ALT 10 12/19/2017 1103   ALT 9 08/06/2017 1047   BILITOT  0.5 12/19/2017 1103   BILITOT 0.66 08/06/2017 1047       RADIOGRAPHIC STUDIES: No results found.  ASSESSMENT AND PLAN: This is a very pleasant 72 years old African-American female with metastatic non-small cell lung cancer, adenocarcinoma with positive EGFR mutation status post several years of treatment with Tarceva and the patient developed positive resistant mutation, T790M. The patient is currently on treatment with Tagrisso status post 38 months.  The patient has been doing very well with this medication with no concerning complaints except for few episodes of diarrhea.  This improved with Imodium. I recommended for her to continue her current treatment with Tagrisso 80 mg p.o. daily. I will see her back for follow-up visit in 2 months for evaluation with repeat CT scan of the chest, abdomen and pelvis for restaging of her disease. She was advised to call immediately if she has any concerning symptoms in the interval. The patient voices understanding of current disease status and treatment options and is in agreement with the current care plan. All questions were answered. The patient knows to call the clinic with any problems, questions or concerns. We can certainly see the patient much sooner if necessary.  Disclaimer: This note was dictated with voice recognition software. Similar sounding words can inadvertently be transcribed and may not be corrected upon review.

## 2018-03-20 MED FILL — TAGRISSO 80 MG TABLET: 80 | 30 days supply | Qty: 30 | Fill #2

## 2018-03-25 IMAGING — CT CT CHEST W/ CM
3 of 8 series · 10 of 46 positions shown, 17 images · IV contrast (iopamidol)
Comparison: Prior CTs dated 08/24/2015 and 12/16/2015)

CLINICAL DATA: Metastatic non-small-cell lung cancer diagnosed in
4004 with multiple recurrences. Restaging post radiation therapy.
History of thyroid cancer in 3118.

EXAM:
CT CHEST, ABDOMEN, AND PELVIS WITH CONTRAST
TECHNIQUE: Multidetector CT imaging of the chest, abdomen and pelvis was
performed following the standard protocol during bolus
administration of intravenous contrast.
CONTRAST:  100mL CV13GG-LVV IOPAMIDOL (CV13GG-LVV) INJECTION 61%

[Series 2: cap st · axial · 0.92mm/px · z∈[-701,-236]mm · 6 of 131 slices shown, 11 images]
[im 19/131  soft-tissue]
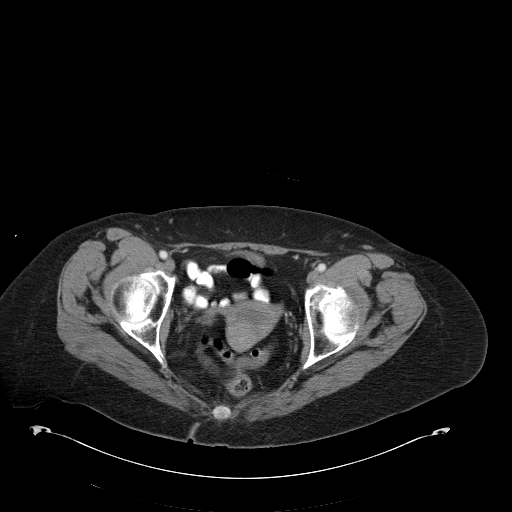
[im 19/131  bone]
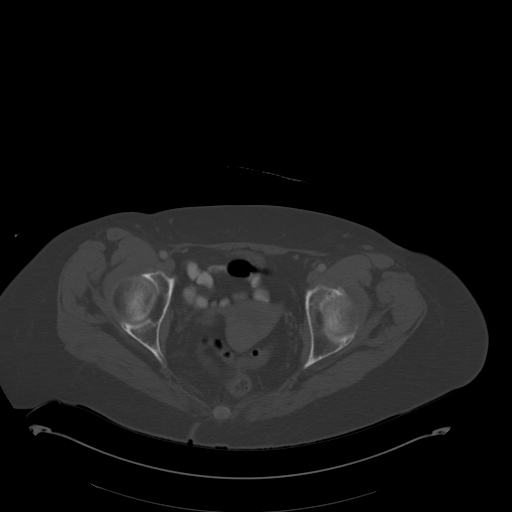
[im 38/131  soft-tissue]
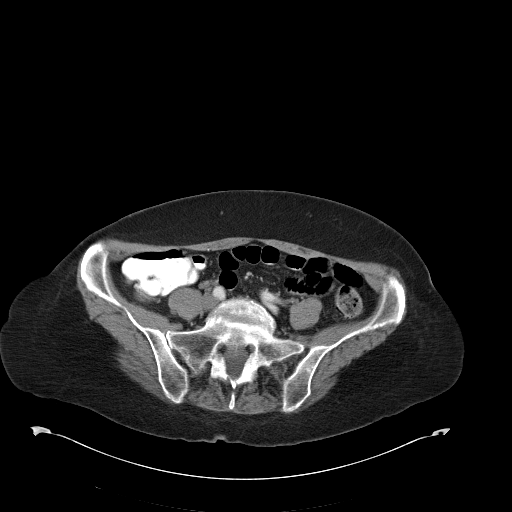
[im 56/131  soft-tissue]
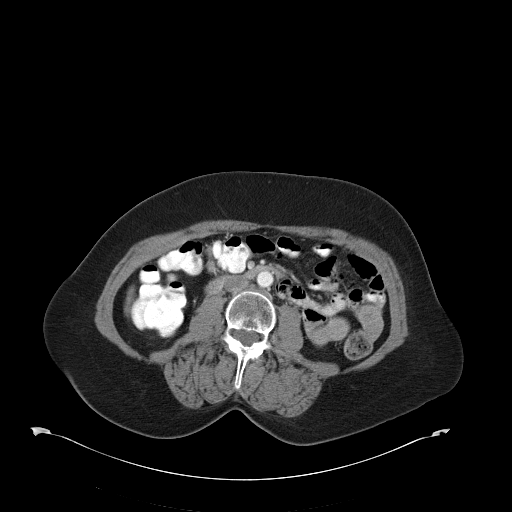
[im 56/131  lung]
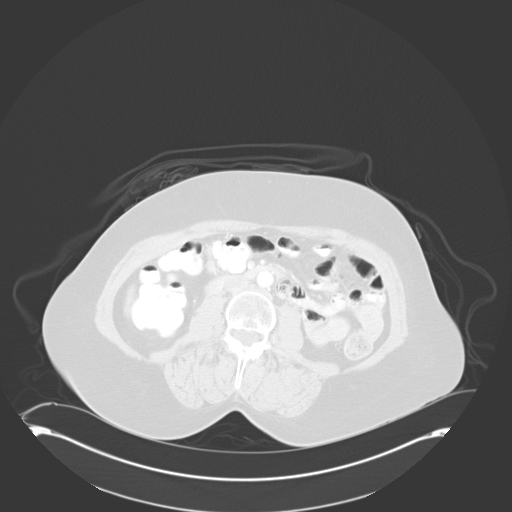
[im 75/131  soft-tissue]
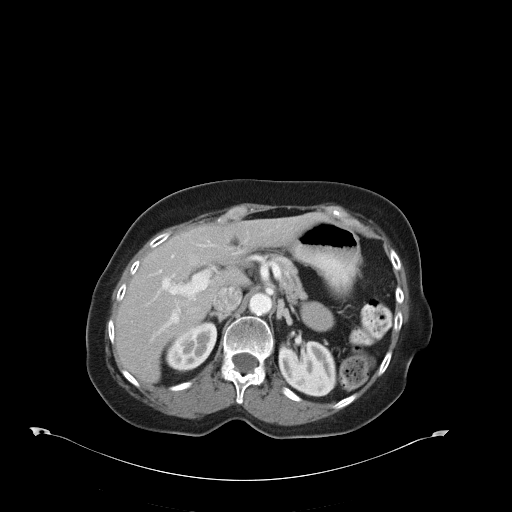
[im 75/131  lung]
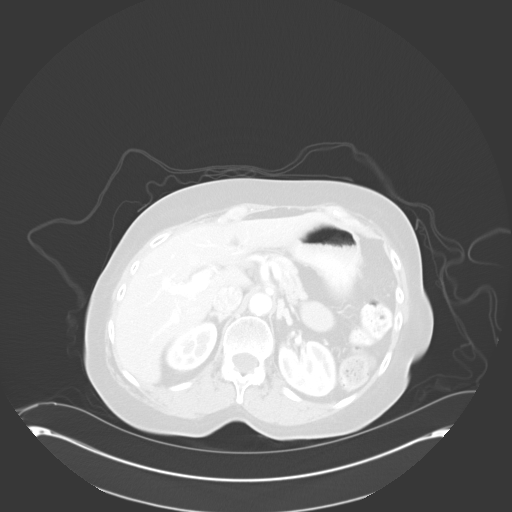
[im 93/131  soft-tissue]
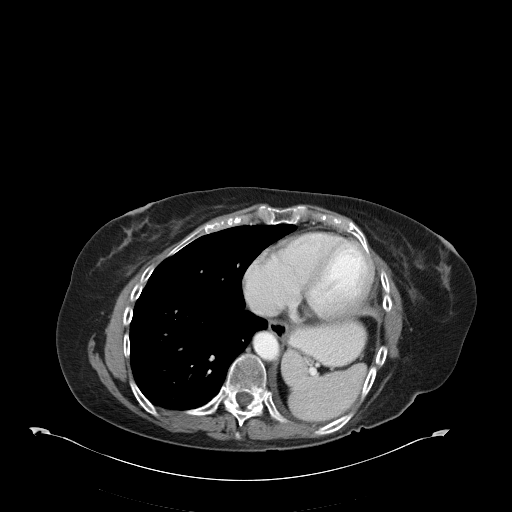
[im 93/131  lung]
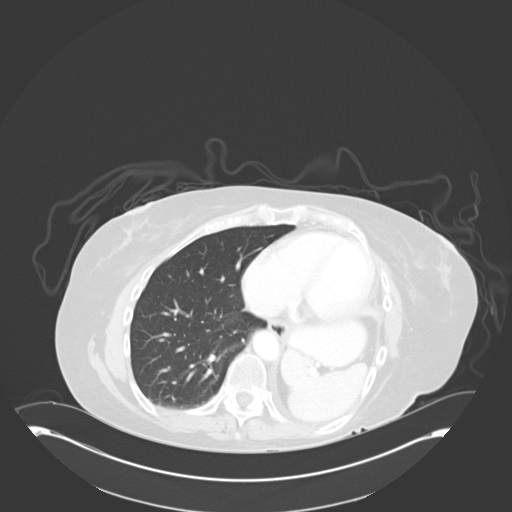
[im 112/131  soft-tissue]
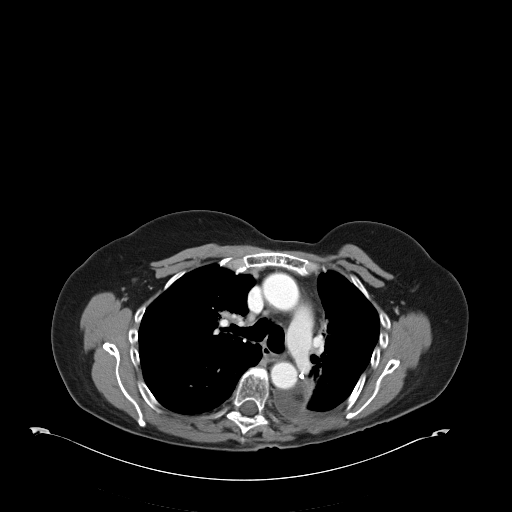
[im 112/131  lung]
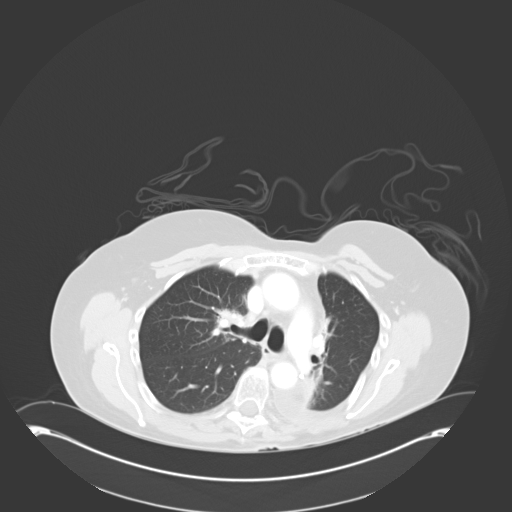

[Series 602: <mpr thick range> · coronal · 1.27mm/px · 3 of 128 slices shown, 4 images]
[im 43/128  soft-tissue]
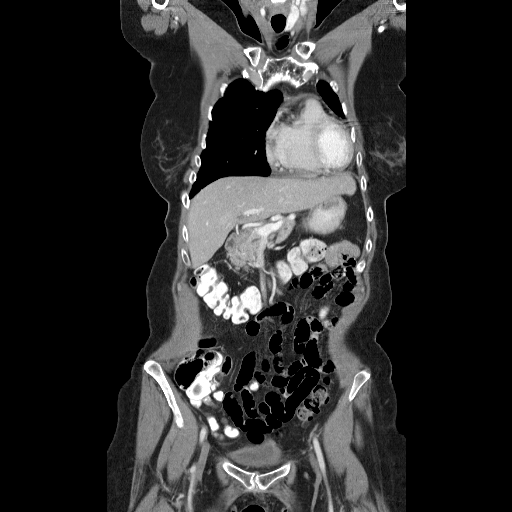
[im 57/128  soft-tissue]
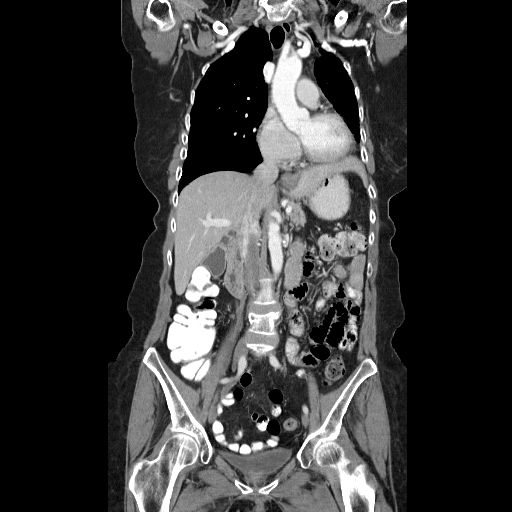
[im 57/128  bone]
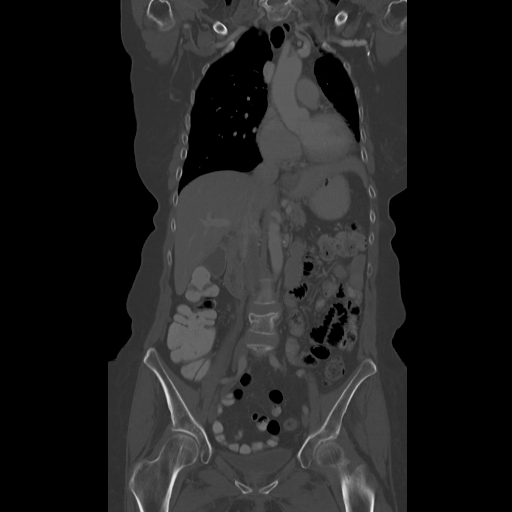
[im 71/128  soft-tissue]
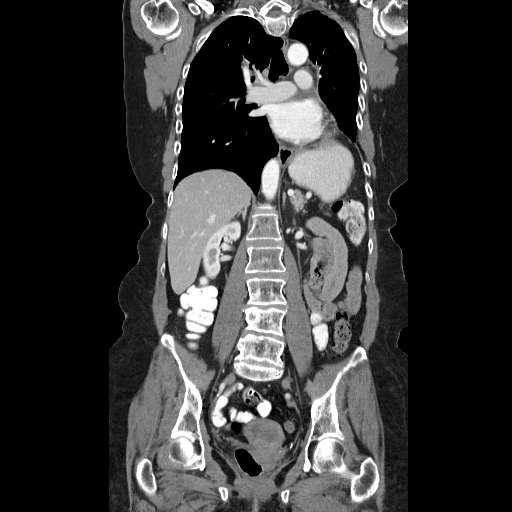

[Series 603: <mpr thick range(1)> · sagittal · 1.27mm/px · 1 of 172 slices shown, 2 images]
[im 86/172  soft-tissue]
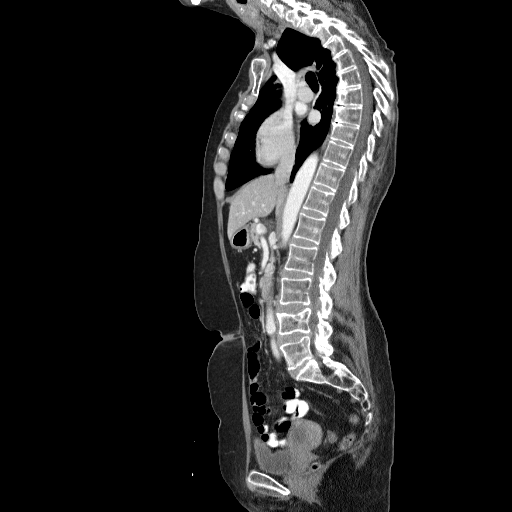
[im 86/172  bone]
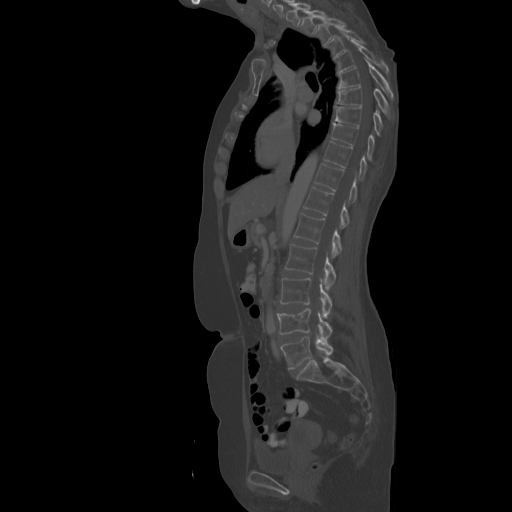

[10 of 46 positions shown; findings below may reference images not displayed]

FINDINGS: CT CHEST FINDINGS

The neck CT findings have been dictated separately.

Cardiovascular: There are no significant vascular findings. Stable
ductus bump. The heart size is normal. There is no pericardial
effusion.

Mediastinum/Nodes: There are no enlarged mediastinal, hilar,
axillary or internal mammary lymph nodes. There is stable distortion
of the left hilum. The right thyroid lobe is surgically absent. The
left lobe appears unremarkable. Small hiatal hernia.

Lungs/Pleura: Stable volume loss in the left hemithorax with a small
amount of loculated pleural fluid posteromedially status post left
lower lobectomy. 4 mm left upper lobe nodule on image 43 is
unchanged. There are stable radiation changes medially in the left
hemithorax. Focal radiation changes are also present in the right
suprahilar region, stable. 4 mm right upper lobe nodule on image 44
is unchanged. No new or enlarging nodules are seen.

Musculoskeletal/Chest wall: No chest wall mass or suspicious osseous
findings. Stable thoracotomy defects on the left and chronic sternal
deformity.

CT ABDOMEN AND PELVIS FINDINGS

Hepatobiliary: Stable tiny cyst in the left hepatic lobe (image 53).
No suspicious hepatic findings. The gallbladder is better distended
without wall thickening or stones. There is mild extrahepatic
biliary prominence with common bile duct measuring up to 7 mm in
diameter. There is an apparent periampullary duodenal diverticulum
containing air.

Pancreas: No evidence of pancreatic mass, surrounding inflammation
or fluid collection. There is mild prominence of the pancreatic duct
which measures up to 6 mm in diameter. This appears new.

Spleen: Normal in size without focal abnormality.

Adrenals/Urinary Tract: Both adrenal glands appear stable without
suspicious findings. The kidneys appear normal without evidence of
urinary tract calculus, suspicious lesion or hydronephrosis. No
bladder abnormalities are seen.

Stomach/Bowel: No evidence of bowel wall thickening, distention or
surrounding inflammatory change.

Vascular/Lymphatic: There are no enlarged abdominal or pelvic lymph
nodes. No significant vascular findings are present.

Reproductive: Stable lobularity of the uterus, probably secondary to
fibroids. There is potentially some fluid within the endometrial
canal, although the appearance is unchanged. No evidence of adnexal
mass.

Other: No ascites or peritoneal nodularity.

Musculoskeletal: No acute or significant osseous findings. Stable
chronic compression deformities at L4 and L5.
IMPRESSION: 1. Stable chest CT with postsurgical and post radiation changes
bilaterally. Small pulmonary nodules bilaterally are unchanged.
2. No evidence of metastatic disease within the abdomen or pelvis.
3. New mild dilatation of common bile duct and pancreatic duct,
potentially related to adjacent duodenal diverticulum. No pancreatic
or ampullary mass identified. Correlation with liver function
studies recommended. If abnormal, further evaluation with endoscopy
should be considered.

## 2018-04-15 ENCOUNTER — Telehealth: Payer: Self-pay | Admitting: Medical Oncology

## 2018-04-15 NOTE — Telephone Encounter (Signed)
Wants flu vaccine and pneumonia vaccine. I tried to call back -no answer.She needs to see PCP for these vaccines.

## 2018-04-17 ENCOUNTER — Other Ambulatory Visit: Payer: Self-pay | Admitting: Internal Medicine

## 2018-04-17 DIAGNOSIS — C3411 Malignant neoplasm of upper lobe, right bronchus or lung: Secondary | ICD-10-CM

## 2018-04-22 ENCOUNTER — Inpatient Hospital Stay: Payer: Medicare Other | Attending: Internal Medicine

## 2018-04-22 ENCOUNTER — Encounter (HOSPITAL_COMMUNITY): Payer: Self-pay

## 2018-04-22 ENCOUNTER — Ambulatory Visit (HOSPITAL_COMMUNITY)
Admission: RE | Admit: 2018-04-22 | Discharge: 2018-04-22 | Disposition: A | Discharge status: Home / Self Care | Payer: Medicare Other | Source: Ambulatory Visit | Attending: Internal Medicine | Admitting: Internal Medicine

## 2018-04-22 ENCOUNTER — Telehealth: Payer: Self-pay | Admitting: Medical Oncology

## 2018-04-22 DIAGNOSIS — C7931 Secondary malignant neoplasm of brain: Secondary | ICD-10-CM | POA: Diagnosis not present

## 2018-04-22 DIAGNOSIS — F419 Anxiety disorder, unspecified: Secondary | ICD-10-CM | POA: Diagnosis not present

## 2018-04-22 DIAGNOSIS — C3490 Malignant neoplasm of unspecified part of unspecified bronchus or lung: Secondary | ICD-10-CM

## 2018-04-22 DIAGNOSIS — Z7901 Long term (current) use of anticoagulants: Secondary | ICD-10-CM | POA: Diagnosis not present

## 2018-04-22 DIAGNOSIS — I517 Cardiomegaly: Secondary | ICD-10-CM | POA: Diagnosis not present

## 2018-04-22 DIAGNOSIS — C3411 Malignant neoplasm of upper lobe, right bronchus or lung: Secondary | ICD-10-CM | POA: Insufficient documentation

## 2018-04-22 DIAGNOSIS — D259 Leiomyoma of uterus, unspecified: Secondary | ICD-10-CM | POA: Diagnosis not present

## 2018-04-22 DIAGNOSIS — K228 Other specified diseases of esophagus: Secondary | ICD-10-CM | POA: Insufficient documentation

## 2018-04-22 DIAGNOSIS — J90 Pleural effusion, not elsewhere classified: Secondary | ICD-10-CM | POA: Insufficient documentation

## 2018-04-22 DIAGNOSIS — I700 Atherosclerosis of aorta: Secondary | ICD-10-CM | POA: Insufficient documentation

## 2018-04-22 DIAGNOSIS — C779 Secondary and unspecified malignant neoplasm of lymph node, unspecified: Secondary | ICD-10-CM | POA: Insufficient documentation

## 2018-04-22 DIAGNOSIS — M818 Other osteoporosis without current pathological fracture: Secondary | ICD-10-CM | POA: Insufficient documentation

## 2018-04-22 DIAGNOSIS — R197 Diarrhea, unspecified: Secondary | ICD-10-CM | POA: Diagnosis not present

## 2018-04-22 DIAGNOSIS — M4856XA Collapsed vertebra, not elsewhere classified, lumbar region, initial encounter for fracture: Secondary | ICD-10-CM | POA: Insufficient documentation

## 2018-04-22 LAB — CMP (CANCER CENTER ONLY)
ALT: 7 U/L (ref 0–44)
AST: 11 U/L — ABNORMAL LOW (ref 15–41)
Albumin: 4.4 g/dL (ref 3.5–5.0)
Alkaline Phosphatase: 66 U/L (ref 38–126)
Anion gap: 9 (ref 5–15)
BUN: 13 mg/dL (ref 8–23)
CO2: 27 mmol/L (ref 22–32)
Calcium: 10 mg/dL (ref 8.9–10.3)
Chloride: 102 mmol/L (ref 98–111)
Creatinine: 0.92 mg/dL (ref 0.44–1.00)
GFR, Est AFR Am: >60 mL/min (ref >60)
GFR, Estimated: >60 mL/min (ref >60)
Glucose, Bld: 97 mg/dL (ref 70–99)
Potassium: 4.1 mmol/L (ref 3.5–5.1)
Sodium: 138 mmol/L (ref 135–145)
Total Bilirubin: 0.7 mg/dL (ref 0.3–1.2)
Total Protein: 7.1 g/dL (ref 6.5–8.1)

## 2018-04-22 LAB — CBC WITH DIFFERENTIAL (CANCER CENTER ONLY)
Basophils Absolute: 0 10*3/uL (ref 0.0–0.1)
Basophils Relative: 0 %
Eosinophils Absolute: 0 10*3/uL (ref 0.0–0.5)
Eosinophils Relative: 1 %
HCT: 40.4 % (ref 34.8–46.6)
Hemoglobin: 12.8 g/dL (ref 11.6–15.9)
Lymphocytes Relative: 16 %
Lymphs Abs: 0.7 10*3/uL — ABNORMAL LOW (ref 0.9–3.3)
MCH: 29.5 pg (ref 25.1–34.0)
MCHC: 31.7 g/dL (ref 31.5–36.0)
MCV: 93.1 fL (ref 79.5–101.0)
Monocytes Absolute: 0.4 10*3/uL (ref 0.1–0.9)
Monocytes Relative: 8 %
Neutro Abs: 3.4 10*3/uL (ref 1.5–6.5)
Neutrophils Relative %: 75 %
Platelet Count: 190 10*3/uL (ref 145–400)
RBC: 4.34 MIL/uL (ref 3.70–5.45)
RDW: 13.9 % (ref 11.2–14.5)
WBC Count: 4.6 10*3/uL (ref 3.9–10.3)

## 2018-04-22 MED ORDER — IOHEXOL 300 MG/ML  SOLN
100.0000 mL | Freq: Once | INTRAMUSCULAR | Status: AC | PRN
  Administered 2018-04-22: 100 mL via INTRAVENOUS

## 2018-04-22 MED ORDER — IOPAMIDOL (ISOVUE-300) INJECTION 61%
30.0000 mL | Freq: Once | INTRAVENOUS | Status: AC | PRN
  Administered 2018-04-22: 30 mL via ORAL

## 2018-04-22 MED ORDER — IOPAMIDOL (ISOVUE-300) INJECTION 61%: 30.0000 mL | Freq: Once | INTRAVENOUS | Status: DC | PRN

## 2018-04-22 MED ORDER — IOPAMIDOL (ISOVUE-300) INJECTION 61%
INTRAVENOUS | Status: AC
  Filled 2018-04-22: qty 30

## 2018-04-22 MED FILL — TAGRISSO 80 MG TABLET: 80 | 30 days supply | Qty: 30 | Fill #0

## 2018-04-22 NOTE — Telephone Encounter (Signed)
Requests flu shot at next visit and when was last pneumonia vaccine? I left message to contact PCP for both -we do not have flu vaccine and PCP should have Pneumovax record.

## 2018-04-24 ENCOUNTER — Encounter: Payer: Self-pay | Admitting: Internal Medicine

## 2018-04-24 ENCOUNTER — Telehealth: Payer: Self-pay

## 2018-04-24 ENCOUNTER — Inpatient Hospital Stay (HOSPITAL_BASED_OUTPATIENT_CLINIC_OR_DEPARTMENT_OTHER): Payer: Medicare Other | Admitting: Internal Medicine

## 2018-04-24 VITALS — BP 130/66 | HR 79 | Temp 98.2°F | Resp 18 | Ht 67.0 in | Wt 171.8 lb

## 2018-04-24 DIAGNOSIS — M818 Other osteoporosis without current pathological fracture: Secondary | ICD-10-CM

## 2018-04-24 DIAGNOSIS — C779 Secondary and unspecified malignant neoplasm of lymph node, unspecified: Secondary | ICD-10-CM

## 2018-04-24 DIAGNOSIS — R197 Diarrhea, unspecified: Secondary | ICD-10-CM

## 2018-04-24 DIAGNOSIS — C3411 Malignant neoplasm of upper lobe, right bronchus or lung: Secondary | ICD-10-CM

## 2018-04-24 DIAGNOSIS — C7931 Secondary malignant neoplasm of brain: Secondary | ICD-10-CM

## 2018-04-24 NOTE — Telephone Encounter (Signed)
Printed avs and calender of upcoming appointment. Per 9/5 los

## 2018-04-24 NOTE — Progress Notes (Signed)
[facility] [phone]   [phone]  OFFICE PROGRESS NOTE  [name], [address] [place] [address]  PRINCIPAL DIAGNOSIS:  metastatic non-small cell lung cancer, adenocarcinoma with brain metastases initially diagnosed as a stage IIb in October 2002.   BIOMARKERS TESTING ([facility]): Positive for: EGFR E746-A750 del, T790M, CTNNB1 D32G, SMAD4 T142f*10 Negative for: RET, ALK, BRAF, KRAS, ERBB2 and MET  PRIOR THERAPY:  1. Status post left lower lobectomy under the care of Dr BArlyce Dicein October 2002. 2. Status post course of concurrent chemoradiation with weekly carboplatin and paclitaxel under the care of Dr SBenay Spiceand Dr WElba Barmanin early 2003. 3. Status post treatment [facility] 4. Status post left occipital craniotomy for tumor resection on September 21, 2005, under the care of Dr CChristella Noa 5. Status post whole brain irradiation under the care of Dr WElba Barman completed November 07, 2005. 6. Status post gamma knife treatment [facility] the resected [facility] 7. Status post right thyroidectomy with radical neck dissection under the care of Dr. RConstance Holsteron 10/18/2011. 8. Status post excisional biopsy of right cervical lymph node that was consistent with metastatic adenocarcinoma. 9. Tarceva 150 mg by mouth daily with therapy beginning 06/03/2007, discontinued in July 2015 secondary to disease progression. Status post 79 cycles. 10. palliative radiotherapy to the enlarging right upper lobe lung nodule under the care of Dr. MValere Dross 11. [id number] 300 mg by mouth daily for 21 days discontinued secondary to study deviation and [facility]. 12. Open reduction and  internal fixation of trimalleolar ankle fracture dislocation with fixation of the fibula as well as medial malleolus.   CURRENT THERAPY: Tagrisso 80 mg by mouth daily started 11/25/2014, status post 40 months of treatment.  INTERVAL HISTORY: Valerie KERIN89y.o. female returns to the clinic today for follow-up visit accompanied by her husband.  The patient is feeling fine today with no concerning complaints except for few episodes of diarrhea earlier today.  The patient denied having any nausea, vomiting or constipation.  She denied having any chest pain, shortness of breath, cough or hemoptysis.  She continues to have low back pain from compression fractures and osteoporosis.  She has no recent weight loss or night sweats.  She has been tolerating her treatment with Tagrisso fairly well.  The patient had repeat CT scan of the chest, abdomen and pelvis performed recently and she is here for evaluation and discussion of her risk her results.  MEDICAL HISTORY: Past Medical History:  Diagnosis Date  . Abnormal Pap smear 2006  . Anal fistula   . Ankle fracture   . Anxiety   . Arthritis   . Atrophic vaginitis 2008  . Cataract   . Dementia 2009  . Dyspareunia 2008  . H/O osteoporosis   . H/O varicella   . H/O vitamin D deficiency   . Headache 07/24/2016  . Heart murmur   . History of measles, mumps, or rubella   . History of radiation therapy 07/28/13- 08/10/13   right lung metastasis 5000 cGy 10 sessions  . Hypertension   . [place]  . [place]   . Lung cancer (  [place])   . Lung metastasis ([place])    PET scan 05/05/13, RUL lung nodule  . Metastasis to brain [facility]) dx'd 2008  . 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 Nodule of right lung CT- 06/03/12   RIGHT UPPER LOBE  . Nodule of right lung 06/03/12   Upper Lobe  . On antineoplastic chemotherapy    TARCEVA  . Osteoporosis 2010  . Primary cancer of right upper lobe of lung ([place]) 04/22/2009    Qualifier: Diagnosis of  By: Nils Pyle CMA ([place]), Mearl Latin    . Shortness of breath    hx lung ca   . Status post chemotherapy 2003   CARBOPLATIN/PACLITAXEL /STATUS POST CLINICAL TRAIL OF CELEBREX AND IRESSA AT [facility] FOR 1 YEAR  . Status post radiation therapy 2003   LEFT LUNG  . Status post radiation therapy 11/07/2005   WHOLE BRAIN: DR Larkin [facility]  . Status post radiation therapy 06/02/2008   GAMMA KNIFE OF RESECTED CAVITAY  . Thyroid adenoma    ?  [name] Thyroid cancer ([place]) 10/18/11 bx   adenoid nodules   . Yeast infection     ALLERGIES:  has No Known Allergies.  MEDICATIONS:  Current Outpatient Medications  Medication Sig Dispense Refill  . Ascorbic Acid (VITAMIN C) 100 MG tablet Take 100 mg by mouth daily.    . cholecalciferol (VITAMIN D) 1000 UNITS tablet Take 1,000 Units by mouth at bedtime.    . donepezil (ARICEPT) 10 MG tablet Take 10 mg by mouth daily.  0  . loperamide (IMODIUM) 2 MG capsule Take 2 mg by mouth as needed for diarrhea or loose stools.     . Multiple Vitamin (MULTI-VITAMINS) TABS Take 1 tablet by mouth daily.     . nitrofurantoin, macrocrystal-monohydrate, (MACROBID) 100 MG capsule Take 1 capsule by mouth 2 (two) times daily.    [name] TAGRISSO 80 MG tablet TAKE 1 TABLET (80MG) BY MOUTH DAILY. 30 tablet 2  . XARELTO 20 MG TABS tablet      No current facility-administered medications for this visit.     SURGICAL HISTORY:  Past Surgical History:  Procedure Laterality Date  . BRAIN SURGERY    . LEFT LOWER LOBECTOMY  05/2001  . LEFT OCCIPITAL CRANIOTOMY  09/21/2005   tumor  . LUNG LOBECTOMY    . NECK SURGERY    . ORIF ANKLE FRACTURE Left 10/02/2014   Procedure: OPEN REDUCTION INTERNAL FIXATION (ORIF) ANKLE FRACTURE;  Surgeon: Alta Corning, MD;  Location: [facility];  Service: Orthopedics;  Laterality: Left;  . RADICAL NECK DISSECTION  10/18/2011   Procedure: RADICAL NECK DISSECTION;  Surgeon: Izora Gala, MD;  Location: [facility];  Service: ENT;  Laterality: Right;  RIGHT  MODIFIED NECK DISSECTION /POSSIBLE RIGHT THYROIDECTOM  . THYROIDECTOMY  10/18/2011   Procedure: THYROIDECTOMY WITH RADICAL NECK DISSECTION;  Surgeon: Izora Gala, MD;  Location: [facility];  Service: ENT;  Laterality: Right;    REVIEW OF SYSTEMS:  Constitutional: positive for fatigue Eyes: negative Ears, nose, mouth, throat, and face: negative Respiratory: negative Cardiovascular: negative Gastrointestinal: positive for diarrhea Genitourinary:negative Integument/breast: negative Hematologic/lymphatic: negative Musculoskeletal:negative Neurological: negative Behavioral/Psych: negative Endocrine: negative Allergic/Immunologic: negative   PHYSICAL EXAMINATION: General appearance: alert, cooperative, fatigued and no distress Head: Normocephalic, without obvious abnormality, atraumatic Neck: no adenopathy, supple, symmetrical, trachea midline and thyroid not enlarged, symmetric, no tenderness/mass/nodules Lymph nodes: Cervical, supraclavicular, and axillary nodes normal. Resp: clear to auscultation bilaterally Back: symmetric, no curvature. ROM normal. No CVA tenderness. Cardio: regular rate  and rhythm, S1, S2 normal, no murmur, click, rub or gallop GI: soft, non-tender; bowel sounds normal; no masses,  no organomegaly Extremities: extremities normal, atraumatic, no cyanosis or edema Neurologic: Alert and oriented X 3, normal strength and tone. Normal symmetric reflexes. Normal coordination and gait  ECOG PERFORMANCE STATUS: 1 - Symptomatic but completely ambulatory  Blood pressure 130/66, pulse 79, temperature 98.2 F (36.8 C), temperature source Oral, resp. rate 18, height _0  (1.702 m), weight 171 lb 12.8 oz (77.9 kg), SpO2 100 %.  LABORATORY DATA: Lab Results  Component Value Date   WBC 4.6 04/22/2018   HGB 12.8 04/22/2018   HCT 40.4 04/22/2018   MCV 93.1 04/22/2018   PLT 190 04/22/2018      Chemistry      Component Value Date/Time   NA 138 04/22/2018 1319   NA 141  08/06/2017 1047   K 4.1 04/22/2018 1319   K 3.9 08/06/2017 1047   CL 102 04/22/2018 1319   CL 106 12/09/2012 0806   CO2 27 04/22/2018 1319   CO2 24 08/06/2017 1047   BUN 13 04/22/2018 1319   BUN 15.4 08/06/2017 1047   CREATININE 0.92 04/22/2018 1319   CREATININE 1.0 08/06/2017 1047      Component Value Date/Time   CALCIUM 10.0 04/22/2018 1319   CALCIUM 9.8 08/06/2017 1047   ALKPHOS 66 04/22/2018 1319   ALKPHOS 65 08/06/2017 1047   AST 11 (L) 04/22/2018 1319   AST 11 08/06/2017 1047   ALT 7 04/22/2018 1319   ALT 9 08/06/2017 1047   BILITOT 0.7 04/22/2018 1319   BILITOT 0.66 08/06/2017 1047       RADIOGRAPHIC STUDIES: Ct Chest W Contrast  Result Date: 04/23/2018 CLINICAL DATA:  Metastatic non-small cell lung cancer restaging EXAM: CT CHEST, ABDOMEN, AND PELVIS WITH CONTRAST TECHNIQUE: Multidetector CT imaging of the chest, abdomen and pelvis was performed following the standard protocol during bolus administration of intravenous contrast. CONTRAST:  138m OMNIPAQUE IOHEXOL 300 MG/ML SOLN, 386mISOVUE-300 IOPAMIDOL (ISOVUE-300) INJECTION 61% COMPARISON:  Multiple exams, including 10/04/2017 and 04/02/2017 FINDINGS: CT CHEST FINDINGS Cardiovascular: Atherosclerotic calcification of the aortic arch. Mild calcification of the mitral valve. Mild cardiomegaly. Trace pericardial effusion. Mediastinum/Nodes: Asymmetric parotid glands as on prior CT of the neck. Absent right lobe of the thyroid. Mildly dilated esophagus. No pathologic adenopathy observed. Lungs/Pleura: Left lower lobectomy with bandlike scarring posteriorly in the left upper lobe and associated left hemithoracic volume loss. Right upper lobe suprahilar volume loss and peribronchovascular density measuring about 3.0 by 1.7 cm on image 41/7, roughly stable from 10/04/2017. Stable peripheral 6 by 3 mm right upper lobe nodule on image 38/7. Small left pleural effusion. Musculoskeletal: Stable separation between the left sixth and  seventh ribs, with part of the serratus anterior and inferior scapula bulging into the resulting thoracic defect. Prominent thoracic kyphosis. Chronic deformity of the sternum compatible with an old fracture. Thoracic spondylosis. CT ABDOMEN PELVIS FINDINGS Hepatobiliary: A small hypodensity along the dome of the liver near the diaphragm along segment 7 of the liver, images 50-51 of series 2, is noted. This is stable and may represent a small subcapsular cyst, measuring approximately 1.1 by 0.5 by 1.4 cm. Mild wall thickening of the gallbladder with slight irregularity which may be from hyperplastic cholecystosis. Pancreas: Mild chronic prominence of the dorsal pancreatic duct in the pancreatic head. No discrete pancreatic parenchymal lesion is observed. Spleen: Unremarkable Adrenals/Urinary Tract: There is at least partial duplication of the left collecting system. No  scarring or hydronephrosis. Adrenal glands normal. Stomach/Bowel: Unremarkable Vascular/Lymphatic: Aortoiliac atherosclerotic vascular disease. Reproductive: Posterior uterine body fibroid 3.4 by 2.9 by 2.6 cm. Adnexa unremarkable. Other: No supplemental non-categorized findings. Musculoskeletal: Bony demineralization. Stable compression fractures at L1 and L4. IMPRESSION: 1. Stable appearance of the chest, abdomen, and pelvis compared to 10/04/2017, without findings of recurrence or metastatic disease. 2. Small pulmonary nodules are unchanged from prior. 3. Mild cardiomegaly, with trace pericardial effusion trace left pleural effusion. 4.  Aortic Atherosclerosis (ICD10-I70.0). 5. Mild chronic prominence of the dorsal pancreatic duct in the pancreatic head, without a discrete mass. 6. 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 7. Stable posterior uterine body fibroid. 8. Bony demineralization with chronic compression fractures at L1 and L4. 9. Mild gallbladder wall thickening with slight irregularity which  may be from hyperplastic cholecystosis. 10. Small hypodense lesion posteriorly along segment 7 of the liver, most likely benign and not changed. 11. Mildly dilated esophagus, query dysmotility. Electronically Signed   By: Van Clines M.D.   On: 04/23/2018 07:22   Ct Abdomen Pelvis W Contrast  Result Date: 04/23/2018 CLINICAL DATA:  Metastatic non-small cell lung cancer restaging EXAM: CT CHEST, ABDOMEN, AND PELVIS WITH CONTRAST TECHNIQUE: Multidetector CT imaging of the chest, abdomen and pelvis was performed following the standard protocol during bolus administration of intravenous contrast. CONTRAST:  192m OMNIPAQUE IOHEXOL 300 MG/ML SOLN, 31mISOVUE-300 IOPAMIDOL (ISOVUE-300) INJECTION 61% COMPARISON:  Multiple exams, including 10/04/2017 and 04/02/2017 FINDINGS: CT CHEST FINDINGS Cardiovascular: Atherosclerotic calcification of the aortic arch. Mild calcification of the mitral valve. Mild cardiomegaly. Trace pericardial effusion. Mediastinum/Nodes: Asymmetric parotid glands as on prior CT of the neck. Absent right lobe of the thyroid. Mildly dilated esophagus. No pathologic adenopathy observed. Lungs/Pleura: Left lower lobectomy with bandlike scarring posteriorly in the left upper lobe and associated left hemithoracic volume loss. Right upper lobe suprahilar volume loss and peribronchovascular density measuring about 3.0 by 1.7 cm on image 41/7, roughly stable from 10/04/2017. Stable peripheral 6 by 3 mm right upper lobe nodule on image 38/7. Small left pleural effusion. Musculoskeletal: Stable separation between the left sixth and seventh ribs, with part of the serratus anterior and inferior scapula bulging into the resulting thoracic defect. Prominent thoracic kyphosis. Chronic deformity of the sternum compatible with an old fracture. Thoracic spondylosis. CT ABDOMEN PELVIS FINDINGS Hepatobiliary: A small hypodensity along the dome of the liver near the diaphragm along segment 7 of the liver,  images 50-51 of series 2, is noted. This is stable and may represent a small subcapsular cyst, measuring approximately 1.1 by 0.5 by 1.4 cm. Mild wall thickening of the gallbladder with slight irregularity which may be from hyperplastic cholecystosis. Pancreas: Mild chronic prominence of the dorsal pancreatic duct in the pancreatic head. No discrete pancreatic parenchymal lesion is observed. Spleen: Unremarkable Adrenals/Urinary Tract: There is at least partial duplication of the left collecting system. No scarring or hydronephrosis. Adrenal glands normal. Stomach/Bowel: Unremarkable Vascular/Lymphatic: Aortoiliac atherosclerotic vascular disease. Reproductive: Posterior uterine body fibroid 3.4 by 2.9 by 2.6 cm. Adnexa unremarkable. Other: No supplemental non-categorized findings. Musculoskeletal: Bony demineralization. Stable compression fractures at L1 and L4. IMPRESSION: 1. Stable appearance of the chest, abdomen, and pelvis compared to 10/04/2017, without findings of recurrence or metastatic disease. 2. Small pulmonary nodules are unchanged from prior. 3. Mild cardiomegaly, with trace pericardial effusion trace left pleural effusion. 4.  Aortic Atherosclerosis (ICD10-I70.0). 5. Mild chronic prominence of the dorsal pancreatic duct in the pancreatic head, without a discrete  mass. 6. 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 7. Stable posterior uterine body fibroid. 8. Bony demineralization with chronic compression fractures at L1 and L4. 9. Mild gallbladder wall thickening with slight irregularity which may be from hyperplastic cholecystosis. 10. Small hypodense lesion posteriorly along segment 7 of the liver, most likely benign and not changed. 11. Mildly dilated esophagus, query dysmotility. Electronically Signed   By: Van Clines M.D.   On: 04/23/2018 07:22    ASSESSMENT AND PLAN: This is a very pleasant 72 years old African-American female with metastatic  non-small cell lung cancer, adenocarcinoma with positive EGFR mutation status post several years of treatment with Tarceva and the patient developed positive resistant mutation, T790M. The patient is currently on treatment with Tagrisso status post 40 months.  She is tolerating her treatment with Tagrisso fairly well. She had repeat CT scan of the chest, abdomen and pelvis performed recently.  I personally and independently reviewed the scans and discussed the results with the patient and her husband.  Her scan showed no concerning findings for disease progression. I recommended for the patient to continue her current treatment with Tagrisso with the same dose. For the diarrhea, she will continue on Imodium on as-needed basis. I will see her back for follow-up visit in 2 months for evaluation after repeating CBC and comprehensive metabolic panel. She was advised to call immediately if she has any concerning symptoms in the interval. The patient voices understanding of current disease status and treatment options and is in agreement with the current care plan. All questions were answered. The patient knows to call the clinic with any problems, questions or concerns. We can certainly see the patient much sooner if necessary.  Disclaimer: This note was dictated with voice recognition software. Similar sounding words can inadvertently be transcribed and may not be corrected upon review.

## 2018-05-19 MED FILL — TAGRISSO 80 MG TABLET: 80 | 30 days supply | Qty: 30 | Fill #1

## 2018-06-11 ENCOUNTER — Encounter: Payer: Self-pay | Admitting: Internal Medicine

## 2018-06-11 ENCOUNTER — Ambulatory Visit: Payer: Medicare Other | Admitting: Internal Medicine

## 2018-06-11 VITALS — BP 112/70 | HR 98 | Temp 98.0°F | Ht 67.0 in | Wt 174.8 lb

## 2018-06-11 DIAGNOSIS — E039 Hypothyroidism, unspecified: Secondary | ICD-10-CM

## 2018-06-11 DIAGNOSIS — C73 Malignant neoplasm of thyroid gland: Secondary | ICD-10-CM | POA: Diagnosis not present

## 2018-06-11 NOTE — Progress Notes (Signed)
Subjective:     Patient ID: Valerie [name] , female    DOB: 1945-11-06 , 72 y.o.   MRN: [id number]   Pt is here for hypothyroid FU. Has been feeling more fatigued than usual and could be her cancer med, but wants to make sure her thyroid function is OK.     Past Medical History:  Diagnosis Date  . Abnormal Pap smear 2006  . Anal fistula   . Ankle fracture   . Anxiety   . Arthritis   . Atrophic vaginitis 2008  . Cataract   . Dementia ([place]) 2009  . Dyspareunia 2008  . H/O osteoporosis   . H/O varicella   . H/O vitamin D deficiency   . Headache 07/24/2016  . Heart murmur   . History of measles, mumps, or rubella   . History of radiation therapy 07/28/13- 08/10/13   right lung metastasis 5000 cGy 10 sessions  . Hypertension   . Lung cancer ([place]) dx'd 2002  . Lung cancer ([place])   . Lung cancer ([place])   . Lung metastasis ([place])    PET scan 05/05/13, RUL lung nodule  . 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 Nodule of right lung CT- 06/03/12   RIGHT UPPER LOBE  . Nodule of right lung 06/03/12   Upper Lobe  . On antineoplastic chemotherapy    TARCEVA  . Osteoporosis 2010  . Primary cancer of right upper lobe of lung ([place]) 04/22/2009   Qualifier: Diagnosis of  By: Nils Pyle CMA ([place]), Mearl Latin    . Shortness of breath    hx lung ca   . Status post chemotherapy 2003   CARBOPLATIN/PACLITAXEL /STATUS POST CLINICAL TRAIL OF CELEBREX AND IRESSA AT [facility] FOR 1 YEAR  . Status post radiation therapy 2003   LEFT LUNG  . Status post radiation therapy 11/07/2005   WHOLE BRAIN: DR Larkin [facility]  . Status post radiation therapy 06/02/2008   GAMMA KNIFE OF RESECTED CAVITAY  . Thyroid adenoma    ?  [name] Thyroid cancer ([place]) 10/18/11 bx   adenoid nodules   . Yeast infection       Current Outpatient Medications:  .  Ascorbic Acid (VITAMIN C) 100 MG tablet, Take 100 mg by mouth daily., Disp: , Rfl:  .  cholecalciferol (VITAMIN D) 1000 UNITS  tablet, Take 1,000 Units by mouth at bedtime., Disp: , Rfl:  .  donepezil (ARICEPT) 10 MG tablet, Take 10 mg by mouth daily., Disp: , Rfl: 0 .  loperamide (IMODIUM) 2 MG capsule, Take 2 mg by mouth as needed for diarrhea or loose stools. , Disp: , Rfl:  .  Multiple Vitamin (MULTI-VITAMINS) TABS, Take 1 tablet by mouth daily. , Disp: , Rfl:  .  TAGRISSO 80 MG tablet, TAKE 1 TABLET (80MG ) BY MOUTH DAILY., Disp: 30 tablet, Rfl: 2 .  XARELTO 20 MG TABS tablet, , Disp: , Rfl:    No Known Allergies   Review of Systems  Constitutional: Positive for appetite change and fatigue. Negative for chills, diaphoresis, fever and unexpected weight change.  HENT: Negative.   Respiratory: Negative for cough, chest tightness and shortness of breath.   Cardiovascular: Negative for chest pain, palpitations and leg swelling.  Gastrointestinal: Positive for diarrhea. Negative for vomiting.       2-3 per day sometimes and is side effect if the cancer meicaiton  Musculoskeletal: Positive for arthralgias, gait problem and neck pain. Negative for  neck stiffness.       Due to cancer  Skin: Negative for rash.       Hyperpigmentation on chin, has dry skin     Today's Vitals   06/11/18 1135  BP: 112/70  Pulse: 98  Temp: 98 F (36.7 C)  TempSrc: Oral  SpO2: 96%  Weight: 174 lb 12.8 oz (79.3 kg)  Height: 5\' 7"  (1.702 m)   Body mass index is 27.38 kg/m.   Objective:  Physical Exam   Constitutional: She is oriented to person, place, and time. She appears well-developed and well-nourished. No distress. She is sitting on a wheelchair. Her husband is with her.  HENT:  Head: Normocephalic and atraumatic.  Right Ear: External ear normal.  Left Ear: External ear normal.  Nose: Nose normal.  Eyes: Conjunctivae are normal. Right eye exhibits no discharge. Left eye exhibits no discharge. No scleral icterus.  Neck: Neck supple. No thyromegaly present.  No carotid bruits bilaterally  Cardiovascular: Normal rate and  regular rhythm.  No murmur heard. Pulmonary/Chest: Effort normal and breath sounds normal. No respiratory distress.  Musculoskeletal: Normal range of motion. She exhibits no edema.  Lymphadenopathy:    She has no cervical adenopathy.  Neurological: She is alert and oriented to person, place, and time.  Skin: Skin is warm and dry. Capillary refill takes less than 2 seconds. Has hyperpigmented dry skin on her chin. She is not diaphoretic.  Psychiatric: She has a normal mood and affect. Her behavior is normal. Judgment and thought content normal.  Nursing note reviewed.    Assessment And Plan:     1. Hypothyroidism, unspecified type- chronic. Thyroid studies ordered. We will inform her when the results are back. Will stay on current dose for now   [facility], PA-C

## 2018-06-12 ENCOUNTER — Encounter: Payer: Self-pay | Admitting: Internal Medicine

## 2018-06-12 LAB — TSH: TSH: 2.6 u[IU]/mL (ref 0.450–4.500)

## 2018-06-12 LAB — T4, FREE: Free T4: 1.27 ng/dL (ref 0.82–1.77)

## 2018-06-12 LAB — T3, FREE: T3, Free: 2.7 pg/mL (ref 2.0–4.4)

## 2018-06-19 MED FILL — TAGRISSO 80 MG TABLET: 80 | 30 days supply | Qty: 30 | Fill #2

## 2018-06-24 ENCOUNTER — Encounter: Payer: Self-pay | Admitting: Internal Medicine

## 2018-06-24 ENCOUNTER — Inpatient Hospital Stay: Payer: Medicare Other | Attending: Internal Medicine

## 2018-06-24 ENCOUNTER — Inpatient Hospital Stay (HOSPITAL_BASED_OUTPATIENT_CLINIC_OR_DEPARTMENT_OTHER): Payer: Medicare Other | Admitting: Internal Medicine

## 2018-06-24 ENCOUNTER — Telehealth: Payer: Self-pay | Admitting: Internal Medicine

## 2018-06-24 VITALS — BP 103/54 | HR 85 | Temp 98.2°F | Resp 14 | Ht 67.0 in | Wt 175.2 lb

## 2018-06-24 DIAGNOSIS — C3411 Malignant neoplasm of upper lobe, right bronchus or lung: Secondary | ICD-10-CM | POA: Diagnosis present

## 2018-06-24 DIAGNOSIS — K529 Noninfective gastroenteritis and colitis, unspecified: Secondary | ICD-10-CM

## 2018-06-24 DIAGNOSIS — Z79899 Other long term (current) drug therapy: Secondary | ICD-10-CM

## 2018-06-24 DIAGNOSIS — I10 Essential (primary) hypertension: Secondary | ICD-10-CM

## 2018-06-24 DIAGNOSIS — Z923 Personal history of irradiation: Secondary | ICD-10-CM | POA: Insufficient documentation

## 2018-06-24 DIAGNOSIS — Z9221 Personal history of antineoplastic chemotherapy: Secondary | ICD-10-CM | POA: Diagnosis not present

## 2018-06-24 DIAGNOSIS — C7931 Secondary malignant neoplasm of brain: Secondary | ICD-10-CM | POA: Insufficient documentation

## 2018-06-24 DIAGNOSIS — C719 Malignant neoplasm of brain, unspecified: Secondary | ICD-10-CM

## 2018-06-24 LAB — CBC WITH DIFFERENTIAL (CANCER CENTER ONLY)
Abs Immature Granulocytes: 0.01 10*3/uL (ref 0.00–0.07)
Basophils Absolute: 0 10*3/uL (ref 0.0–0.1)
Basophils Relative: 0 %
Eosinophils Absolute: 0.1 10*3/uL (ref 0.0–0.5)
Eosinophils Relative: 2 %
HCT: 39.8 % (ref 36.0–46.0)
Hemoglobin: 12.4 g/dL (ref 12.0–15.0)
Immature Granulocytes: 0 %
Lymphocytes Relative: 17 %
Lymphs Abs: 0.7 10*3/uL (ref 0.7–4.0)
MCH: 29.5 pg (ref 26.0–34.0)
MCHC: 31.2 g/dL (ref 30.0–36.0)
MCV: 94.5 fL (ref 80.0–100.0)
Monocytes Absolute: 0.4 10*3/uL (ref 0.1–1.0)
Monocytes Relative: 10 %
Neutro Abs: 3 10*3/uL (ref 1.7–7.7)
Neutrophils Relative %: 71 %
Platelet Count: 200 10*3/uL (ref 150–400)
RBC: 4.21 MIL/uL (ref 3.87–5.11)
RDW: 13.6 % (ref 11.5–15.5)
WBC Count: 4.2 10*3/uL (ref 4.0–10.5)
nRBC: 0 % (ref 0.0–0.2)

## 2018-06-24 LAB — CMP (CANCER CENTER ONLY)
ALT: 7 U/L (ref 0–44)
AST: 10 U/L — ABNORMAL LOW (ref 15–41)
Albumin: 4.2 g/dL (ref 3.5–5.0)
Alkaline Phosphatase: 70 U/L (ref 38–126)
Anion gap: 9 (ref 5–15)
BUN: 18 mg/dL (ref 8–23)
CO2: 23 mmol/L (ref 22–32)
Calcium: 9.8 mg/dL (ref 8.9–10.3)
Chloride: 109 mmol/L (ref 98–111)
Creatinine: 0.97 mg/dL (ref 0.44–1.00)
GFR, Est AFR Am: >60 mL/min (ref >60)
GFR, Estimated: 57 mL/min — ABNORMAL LOW (ref >60)
Glucose, Bld: 84 mg/dL (ref 70–99)
Potassium: 3.8 mmol/L (ref 3.5–5.1)
Sodium: 141 mmol/L (ref 135–145)
Total Bilirubin: 0.5 mg/dL (ref 0.3–1.2)
Total Protein: 7.1 g/dL (ref 6.5–8.1)

## 2018-06-24 NOTE — Telephone Encounter (Signed)
Appts scheduled avs/calendar printed per 11/5 los

## 2018-06-24 NOTE — Progress Notes (Signed)
[place] [phone]   [phone]  OFFICE PROGRESS NOTE  [name], [address] [place] [address]  PRINCIPAL DIAGNOSIS:  metastatic non-small cell lung cancer, adenocarcinoma with brain metastases initially diagnosed as a stage IIb in October 2002.   BIOMARKERS TESTING ([facility]): Positive for: EGFR E746-A750 del, T790M, CTNNB1 D32G, SMAD4 T145f*10 Negative for: RET, ALK, BRAF, KRAS, ERBB2 and MET  PRIOR THERAPY:  1. Status post left lower lobectomy under the care of Dr BArlyce Dicein October 2002. 2. Status post course of concurrent chemoradiation with weekly carboplatin and paclitaxel under the care of Dr SBenay Spiceand Dr WElba Barmanin early 2003. 3. Status post treatment [facility] 4. Status post left occipital craniotomy for tumor resection on September 21, 2005, under the care of Dr CChristella Noa 5. Status post whole brain irradiation under the care of Dr WElba Barman completed November 07, 2005. 6. Status post gamma knife [facility] the [facility] 7. Status post right thyroidectomy with radical neck dissection under the care of Dr. RConstance Holsteron 10/18/2011. 8. Status post excisional biopsy of right cervical lymph node that was consistent with metastatic adenocarcinoma. 9. Tarceva 150 mg by mouth daily with therapy beginning 06/03/2007, discontinued in July 2015 secondary to disease progression. Status post 79 cycles. 10. palliative radiotherapy to the enlarging right upper lobe lung nodule under the care of Dr. MValere Dross 11. [id number] 300 mg by mouth daily for 21 days discontinued secondary to study deviation and [facility]. 12. Open reduction and  internal fixation of trimalleolar ankle fracture dislocation with fixation of the fibula as well as medial malleolus.   CURRENT THERAPY: Tagrisso 80 mg by mouth daily started 11/25/2014, status post 42 months of treatment.  INTERVAL HISTORY: Valerie SCHWAKE72y.o. female returns to the clinic today for follow-up visit accompanied by her husband.  The patient is feeling fine today with no concerning complaints except for fatigue.  She denied having any current chest pain, shortness of breath, cough or hemoptysis.  She denied having any fever or chills.  She has no nausea, vomiting or constipation but has few episodes of diarrhea improved with Imodium.  She has been tolerating her treatment with Tagrisso fairly well.  She is here today for evaluation and repeat blood work.  MEDICAL HISTORY: Past Medical History:  Diagnosis Date  . Abnormal Pap smear 2006  . Anal fistula   . Ankle fracture   . Anxiety   . Arthritis   . Atrophic vaginitis 2008  . Cataract   . Dementia ([place] 2009  . Dyspareunia 2008  . H/O osteoporosis   . H/O varicella   . H/O vitamin D deficiency   . Headache 07/24/2016  . Heart murmur   . History of measles, mumps, or rubella   . History of radiation therapy 07/28/13- 08/10/13   right lung metastasis 5000 cGy 10 sessions  . Hypertension   . Lung cancer [place] dx'd 2002  . Lung cancer ([place]   . Lung cancer ([place]   . Lung metastasis ([place]    PET scan 05/05/13, RUL lung nodule  . Metastasis to brain ([facility] dx'd 2008  . Metastasis to  lymph nodes ([place]) dx'd 09/2011  . Nodule of right lung CT- 06/03/12   RIGHT UPPER LOBE  . Nodule of right lung 06/03/12   Upper Lobe  . On antineoplastic chemotherapy    TARCEVA  . Osteoporosis 2010  . Primary cancer of right upper lobe of lung ([place]) 04/22/2009   Qualifier: Diagnosis of  By: Nils Pyle CMA ([place]), Mearl Latin    . Shortness of breath    hx lung ca   . Status post chemotherapy 2003   CARBOPLATIN/PACLITAXEL /STATUS POST  CLINICAL TRAIL OF CELEBREX AND IRESSA AT [facility] FOR 1 YEAR  . Status post radiation therapy 2003   LEFT LUNG  . Status post radiation therapy 11/07/2005   WHOLE BRAIN: DR Larkin [facility]  . Status post radiation therapy 06/02/2008   GAMMA KNIFE OF RESECTED CAVITAY  . Thyroid adenoma    ?  [name] Thyroid cancer ([place]) 10/18/11 bx   adenoid nodules   . Yeast infection     ALLERGIES:  has No Known Allergies.  MEDICATIONS:  Current Outpatient Medications  Medication Sig Dispense Refill  . Ascorbic Acid (VITAMIN C) 100 MG tablet Take 100 mg by mouth daily.    . cholecalciferol (VITAMIN D) 1000 UNITS tablet Take 1,000 Units by mouth at bedtime.    . donepezil (ARICEPT) 10 MG tablet Take 10 mg by mouth daily.  0  . FLUAD 0.5 ML SUSY ADM 0.5ML IM UTD  0  . loperamide (IMODIUM) 2 MG capsule Take 2 mg by mouth as needed for diarrhea or loose stools.     . Multiple Vitamin (MULTI-VITAMINS) TABS Take 1 tablet by mouth daily.     [name] TAGRISSO 80 MG tablet TAKE 1 TABLET ('80MG'$ ) BY MOUTH DAILY. 30 tablet 2  . XARELTO 20 MG TABS tablet      No current facility-administered medications for this visit.     SURGICAL HISTORY:  Past Surgical History:  Procedure Laterality Date  . BRAIN SURGERY    . LEFT LOWER LOBECTOMY  05/2001  . LEFT OCCIPITAL CRANIOTOMY  09/21/2005   tumor  . LUNG LOBECTOMY    . NECK SURGERY    . ORIF ANKLE FRACTURE Left 10/02/2014   Procedure: OPEN REDUCTION INTERNAL FIXATION (ORIF) ANKLE FRACTURE;  Surgeon: Alta Corning, MD;  Location: [facility];  Service: Orthopedics;  Laterality: Left;  . RADICAL NECK DISSECTION  10/18/2011   Procedure: RADICAL NECK DISSECTION;  Surgeon: Izora Gala, MD;  Location: [facility];  Service: ENT;  Laterality: Right;  RIGHT MODIFIED NECK DISSECTION /POSSIBLE RIGHT THYROIDECTOM  . THYROIDECTOMY  10/18/2011   Procedure: THYROIDECTOMY WITH RADICAL NECK DISSECTION;  Surgeon: Izora Gala, MD;  Location: [facility];  Service: ENT;  Laterality: Right;    REVIEW OF SYSTEMS:  A  comprehensive review of systems was negative except for: Constitutional: positive for fatigue Gastrointestinal: positive for diarrhea   PHYSICAL EXAMINATION: General appearance: alert, cooperative, fatigued and no distress Head: Normocephalic, without obvious abnormality, atraumatic Neck: no adenopathy, supple, symmetrical, trachea midline and thyroid not enlarged, symmetric, no tenderness/mass/nodules Lymph nodes: Cervical, supraclavicular, and axillary nodes normal. Resp: clear to auscultation bilaterally Back: symmetric, no curvature. ROM normal. No CVA tenderness. Cardio: regular rate and rhythm, S1, S2 normal, no murmur, click, rub or gallop GI: soft, non-tender; bowel sounds normal; no masses,  no organomegaly Extremities: extremities normal, atraumatic, no cyanosis or edema  ECOG PERFORMANCE STATUS: 1 - Symptomatic but completely ambulatory  Blood pressure (!) 103/54, pulse 85, temperature 98.2 F (36.8 C), temperature  source Oral, resp. rate 14, height '5\' 7"'$  (1.702 m), weight 175 lb 3.2 oz (79.5 kg), SpO2 100 %.  LABORATORY DATA: Lab Results  Component Value Date   WBC 4.2 06/24/2018   HGB 12.4 06/24/2018   HCT 39.8 06/24/2018   MCV 94.5 06/24/2018   PLT 200 06/24/2018      Chemistry      Component Value Date/Time   NA 138 04/22/2018 1319   NA 141 08/06/2017 1047   K 4.1 04/22/2018 1319   K 3.9 08/06/2017 1047   CL 102 04/22/2018 1319   CL 106 12/09/2012 0806   CO2 27 04/22/2018 1319   CO2 24 08/06/2017 1047   BUN 13 04/22/2018 1319   BUN 15.4 08/06/2017 1047   CREATININE 0.92 04/22/2018 1319   CREATININE 1.0 08/06/2017 1047      Component Value Date/Time   CALCIUM 10.0 04/22/2018 1319   CALCIUM 9.8 08/06/2017 1047   ALKPHOS 66 04/22/2018 1319   ALKPHOS 65 08/06/2017 1047   AST 11 (L) 04/22/2018 1319   AST 11 08/06/2017 1047   ALT 7 04/22/2018 1319   ALT 9 08/06/2017 1047   BILITOT 0.7 04/22/2018 1319   BILITOT 0.66 08/06/2017 1047       RADIOGRAPHIC  STUDIES: No results found.  ASSESSMENT AND PLAN: This is a very pleasant 72 years old African-American female with metastatic non-small cell lung cancer, adenocarcinoma with positive EGFR mutation status post several years of treatment with Tarceva and the patient developed positive resistant mutation, T790M. The patient is currently on treatment with Tagrisso status post 42 months.  She has been tolerating this treatment fairly well with no concerning complaints except for few episodes of diarrhea. I recommended for the patient to continue her current treatment with Tagrisso with the same dose. I will see her back for follow-up visit in 2 months for evaluation and repeat blood work. She was advised to call immediately if she has any concerning symptoms in the interval. The patient voices understanding of current disease status and treatment options and is in agreement with the current care plan. All questions were answered. The patient knows to call the clinic with any problems, questions or concerns. We can certainly see the patient much sooner if necessary.  Disclaimer: This note was dictated with voice recognition software. Similar sounding words can inadvertently be transcribed and may not be corrected upon review.

## 2018-06-25 ENCOUNTER — Encounter: Payer: Self-pay | Admitting: Internal Medicine

## 2018-07-15 ENCOUNTER — Telehealth: Payer: Self-pay | Admitting: Pharmacist

## 2018-07-15 ENCOUNTER — Other Ambulatory Visit: Payer: Self-pay | Admitting: Internal Medicine

## 2018-07-15 DIAGNOSIS — C3411 Malignant neoplasm of upper lobe, right bronchus or lung: Secondary | ICD-10-CM

## 2018-07-15 NOTE — Telephone Encounter (Signed)
Oral Chemotherapy Pharmacist Encounter   Attempted to reach patient for follow up on oral mediation: Tagrisso. No answer. Left VM for patient to call back with any questions or issues.   Thank you,  Leron Croak, PharmD PGY1 Pharmacy Resident [facility] [phone] 07/15/2018   1:02 PM

## 2018-07-16 IMAGING — CT CT CHEST W/ CM
2 of 5 series · 13 of 46 positions shown, 15 images · IV contrast (ISOVUE)
Comparison: Multiple exams, including 03/29/2016

CLINICAL DATA: Metastatic non-small cell lung cancer with multiple
recurrences. Restaging assessment. Thyroid cancer in 7691.

EXAM:
CT CHEST, ABDOMEN, AND PELVIS WITH CONTRAST
TECHNIQUE: Multidetector CT imaging of the chest, abdomen and pelvis was
performed following the standard protocol during bolus
administration of intravenous contrast.
CONTRAST:  100mL MG1AYN-ZYY IOPAMIDOL (MG1AYN-ZYY) INJECTION 61%

[Series 2: abd/pel with · axial · 0.87mm/px · z∈[-616,-56]mm · 10 of 134 slices shown, 12 images]
[im 11/134  soft-tissue]
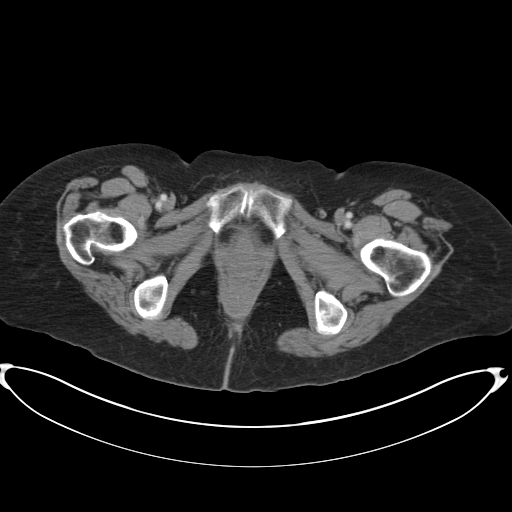
[im 11/134  bone]
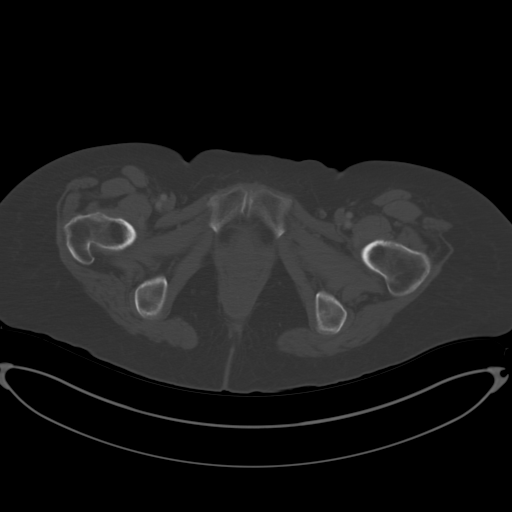
[im 21/134  soft-tissue]
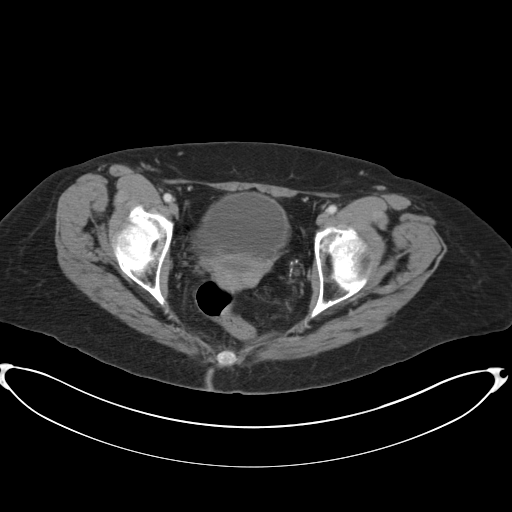
[im 41/134  soft-tissue]
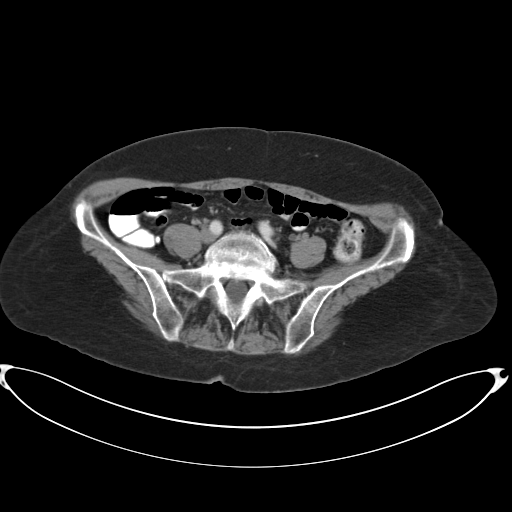
[im 52/134  soft-tissue]
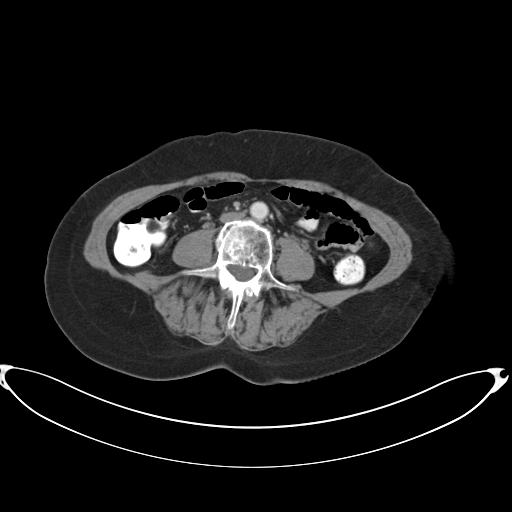
[im 62/134  soft-tissue]
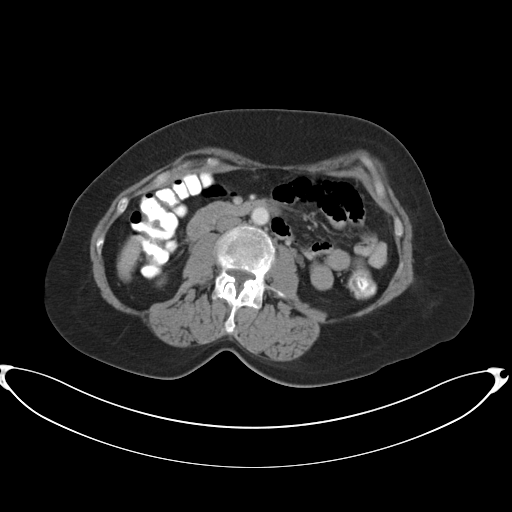
[im 72/134  soft-tissue]
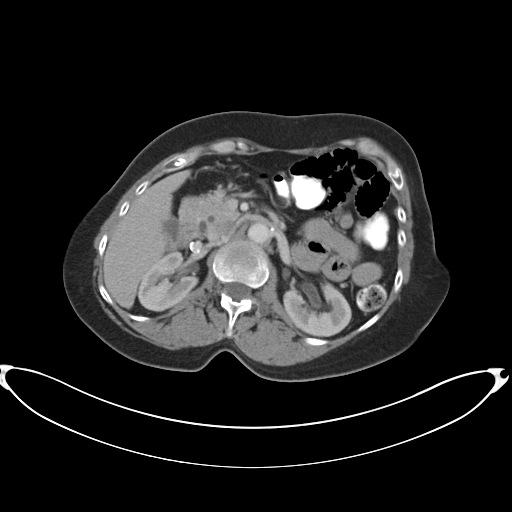
[im 82/134  soft-tissue]
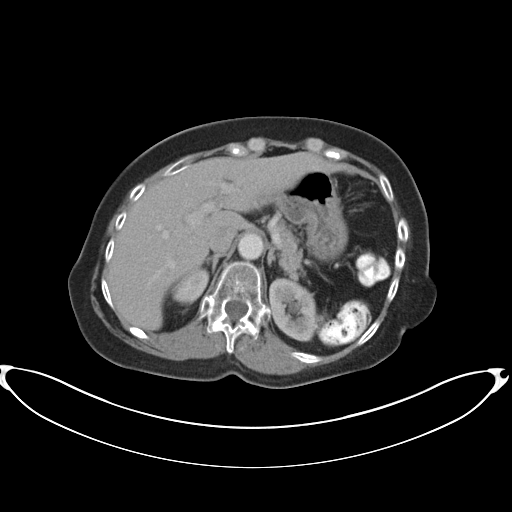
[im 103/134  soft-tissue]
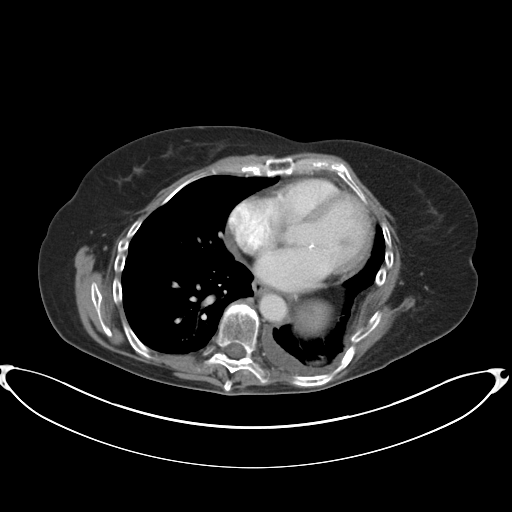
[im 113/134  soft-tissue]
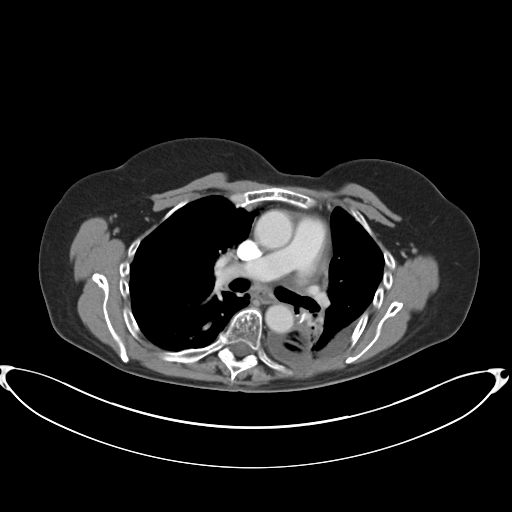
[im 113/134  bone]
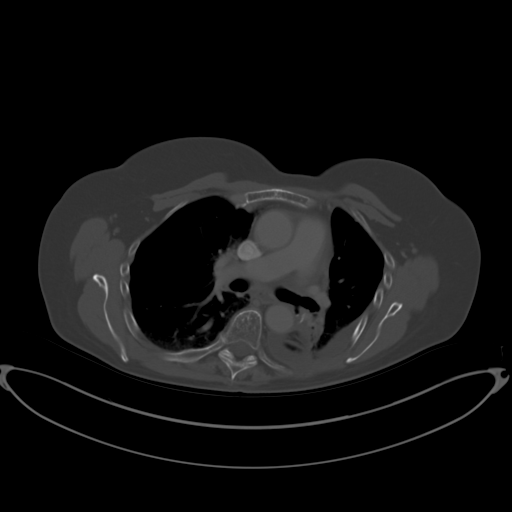
[im 123/134  soft-tissue]
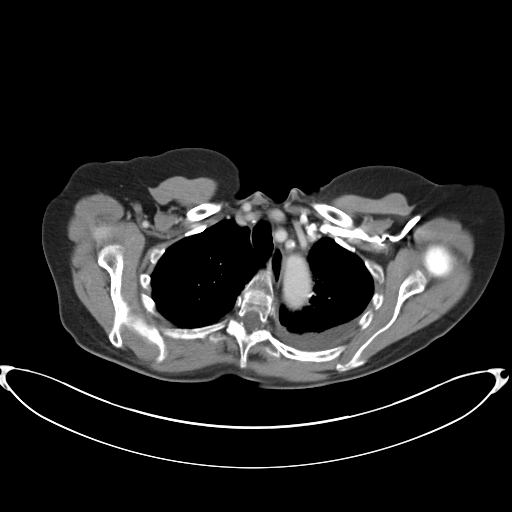

[Series 4: coronal a/|p · coronal · 0.76mm/px · 3 of 132 slices shown]
[im 44/132  soft-tissue]
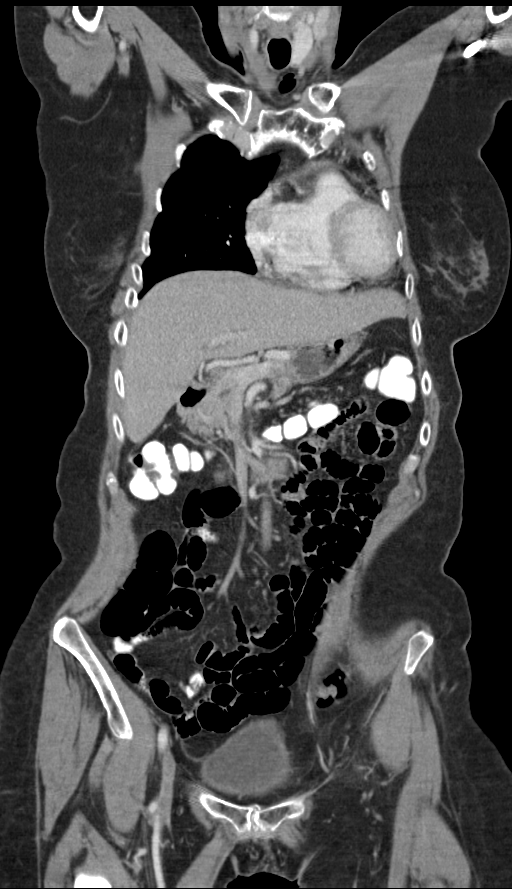
[im 59/132  soft-tissue]
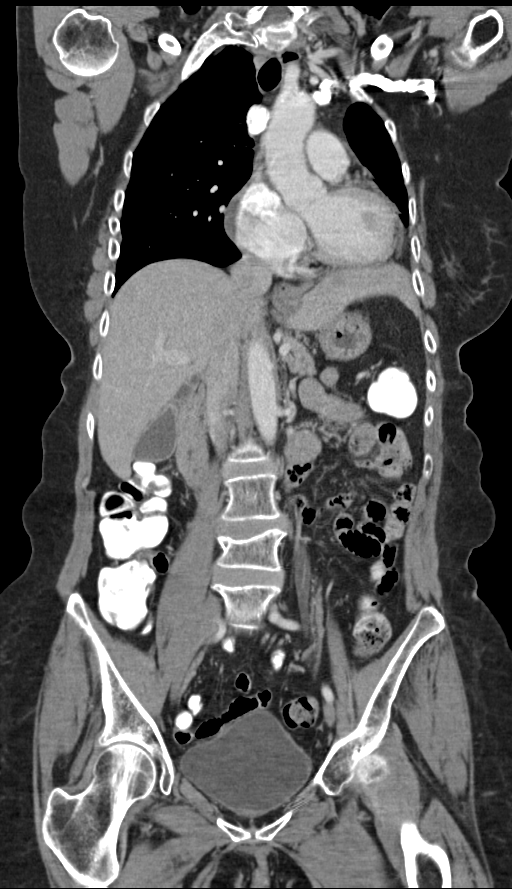
[im 73/132  soft-tissue]
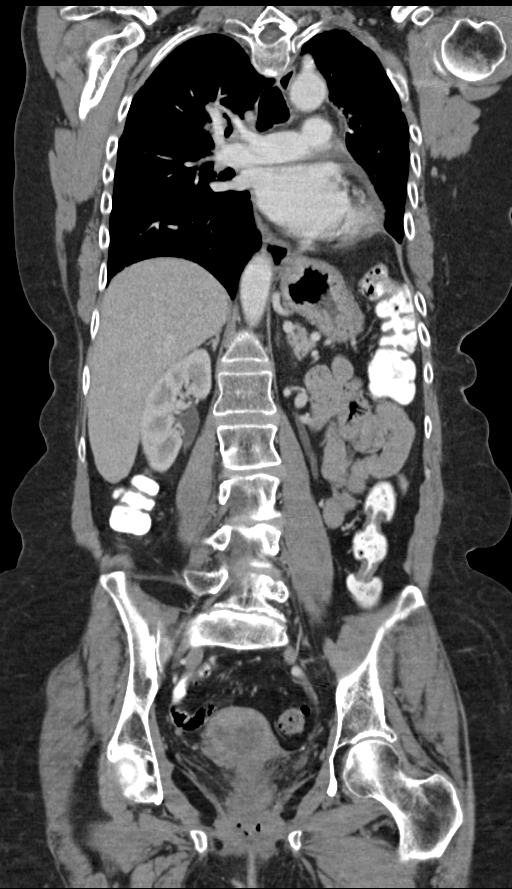

[13 of 46 positions shown; findings below may reference images not displayed]

FINDINGS: CT CHEST FINDINGS

Cardiovascular: Coronary, aortic arch, and branch vessel
atherosclerotic vascular disease. Mild cardiomegaly.

Mediastinum/Nodes: No pathologic adenopathy in the chest. Fluid in
the esophagus without esophageal dilatation or definite esophageal
wall thickening.

Lungs/Pleura: Small to moderate left pleural effusion. Left lower
lobectomy. Mild biapical pleuroparenchymal scarring.

5 by 4 mm right upper lobe pulmonary nodule, image 44/3, stable back
through 10/19/2013.

3 mm right lower lobe subpleural nodule, image 60/3, no change from
5133, and stable from the prior exam.

4 mm left upper lobe pulmonary nodule on image 43/3, stable from
10/19/2013.

Volume loss and radiation therapy related findings posteriorly in
the left mid hemithorax, image 58/3. Right suprahilar density and
volume loss compatible with radiation therapy, fairly similar back
through 12/16/2015.

Musculoskeletal: Widened left 6th intercostal space as before.
Thoracic kyphosis. Stable sclerosis and deformity of the upper
sternal body.

CT ABDOMEN PELVIS FINDINGS

Hepatobiliary: Common bile duct 6 mm in diameter. Gallbladder
unremarkable. No distinct hepatic abnormality.

Pancreas: Dorsal pancreatic duct in the pancreatic head is 6 mm in
diameter, mildly dilated but unchanged from prior. I do not see a
definite pancreatic parenchymal lesion.

Spleen: Unremarkable

Adrenals/Urinary Tract: Slightly thickened left adrenal gland
without mass. Small amount of gas in the urinary bladder possibly
from recent catheterization. Partial duplication of the left
collecting system.

Stomach/Bowel: Appendix normal.

Vascular/Lymphatic: Rim calcified 1.1 cm right renal artery aneurysm
on image 63/2. Not appreciably changed from prior. No pathologic
adenopathy.

Reproductive: Several myometrial masses, the largest 2.6 cm
posteriorly in the uterus on image [REDACTED], likely fibroids. No
significant adnexal abnormality.

Other: No supplemental non-categorized findings.

Musculoskeletal: Stable appearance of the superior endplate
compression fracture at the L4 level. Subtle superior endplate
compression fracture at L5 is chronic. Lumbar spondylosis and mild
degenerative disc disease.
IMPRESSION: 1. Stable small pulmonary nodules back through at least [DATE].
Densely stable radiation therapy related findings in the right
suprahilar region and left mid lung. Prior left lower lobectomy.
2. Overall no compelling findings of currently active malignancy in
the chest, abdomen, or pelvis.
3. Other imaging findings of potential clinical significance:
Coronary, aortic arch, and branch vessel atherosclerotic vascular
disease. Mild cardiomegaly. Small to moderate left pleural effusion.
Stable dilation of the dorsal pancreatic duct in the pancreatic
head. Rim calcified 1.1 cm right renal artery aneurysm, stable.
Uterine fibroids. Chronic endplate compression and L4.

## 2018-07-23 MED FILL — TAGRISSO 80 MG TABLET: 80 | 30 days supply | Qty: 30 | Fill #0

## 2018-08-02 IMAGING — MR MR HEAD WO/W CM
10 of 15 series · 33 of 48 positions shown · IV contrast (multihance)
Comparison: Several prior exams, most recent 06/08/2015, 07/01/2013
and 06/13/2010.

CLINICAL DATA: 70-year-old hypertensive female with history of lung
cancer 5001 and intracranial metastatic disease. Post left occipital
craniotomy for resection of intracranial metastatic disease.
Headaches off and on for 2 months. Subsequent encounter.

EXAM:
MRI HEAD WITHOUT AND WITH CONTRAST
TECHNIQUE: Multiplanar, multiecho pulse sequences of the brain and surrounding
structures were obtained without and with intravenous contrast.
CONTRAST:  17mL MULTIHANCE GADOBENATE DIMEGLUMINE 529 MG/ML IV SOLN

[Series 4: DWI · axial · 3.0mm · 1.09mm/px · z∈[-48,+93]mm · 8 of 98 slices shown (1 of 4)]
[im 1/98]
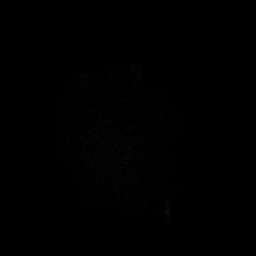
[im 14/98]
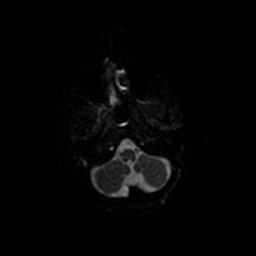
[im 28/98]
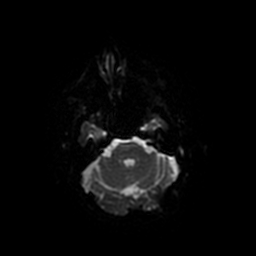
[im 42/98]
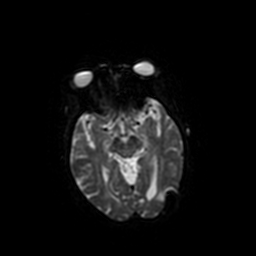
[im 56/98]
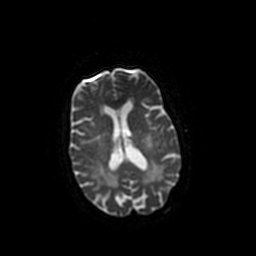
[im 70/98]
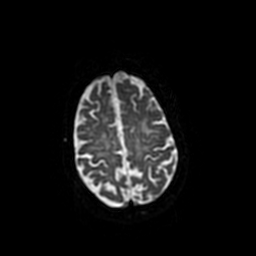
[im 84/98]
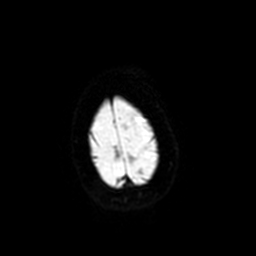
[im 98/98]
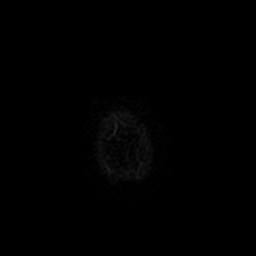

[Series 5: DWI · coronal · 5.0mm · 1.09mm/px · 6 of 70 slices shown (2 of 4)]
[im 1/70]
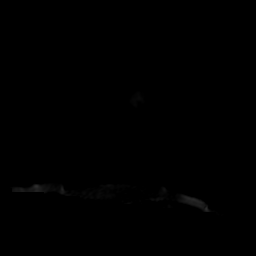
[im 14/70]
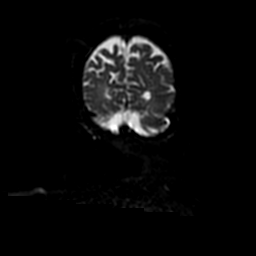
[im 28/70]
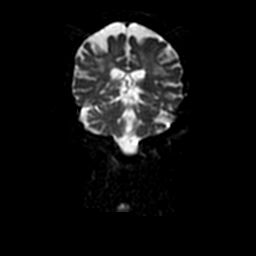
[im 42/70]
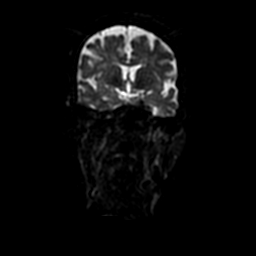
[im 56/70]
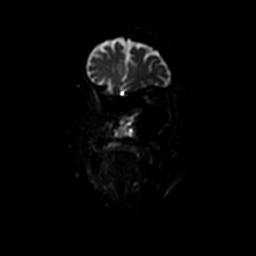
[im 70/70]
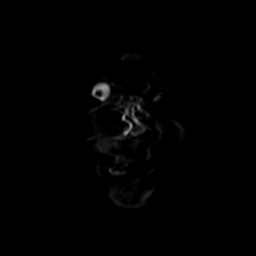

[Series 6: T2 · axial · 5.0mm · 0.43mm/px · z∈[-47,+95]mm · 2 of 23 slices shown (1 of 2)]
[im 1/23]
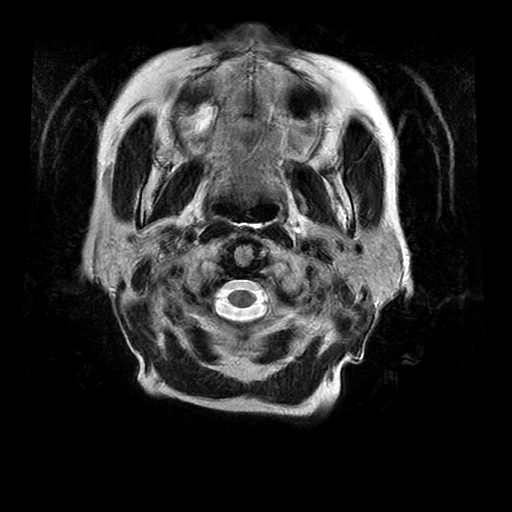
[im 23/23]
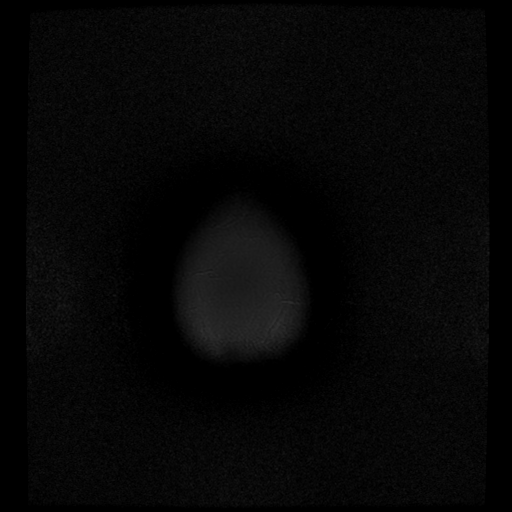

[Series 7: FLAIR · axial · 5.0mm · 0.43mm/px · z∈[-52,+101]mm · 2 of 24 slices shown (1 of 2)]
[im 1/24]
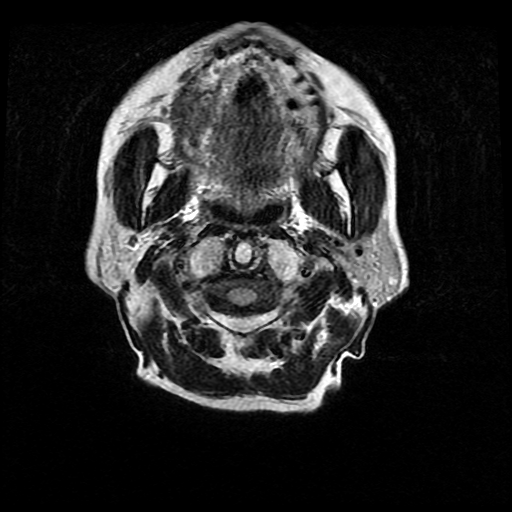
[im 24/24]
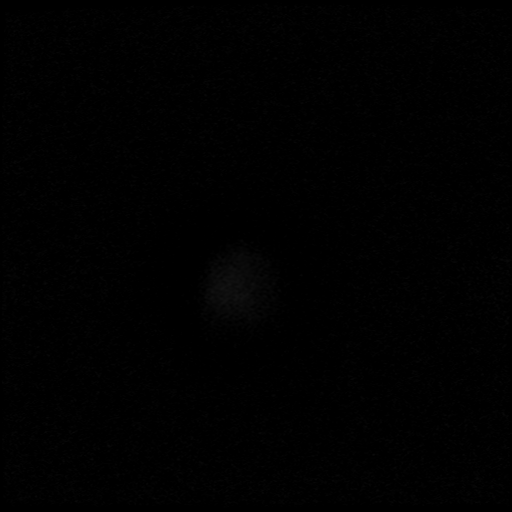

[Series 9: T2 · axial · 5.0mm · 0.43mm/px · z∈[-47,+96]mm · 2 of 26 slices shown (2 of 2)]
[im 1/26]
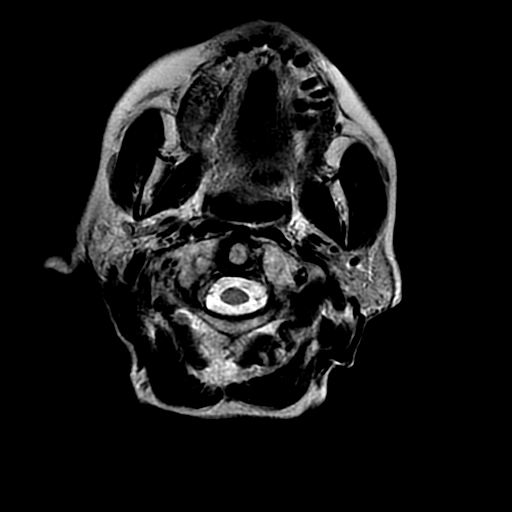
[im 26/26]
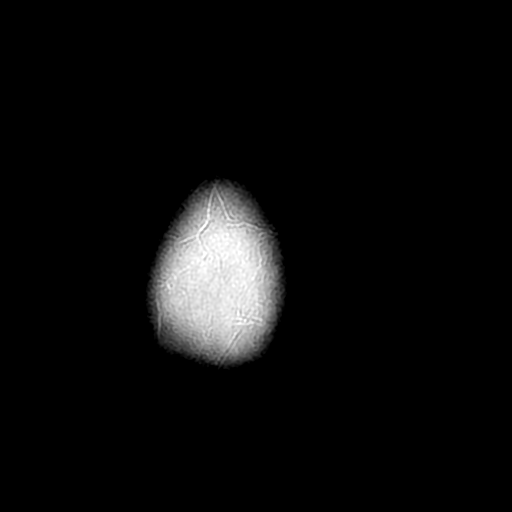

[Series 10: FLAIR · axial · 5.0mm · 0.43mm/px · z∈[-47,+96]mm · 2 of 26 slices shown (2 of 2)]
[im 1/26]
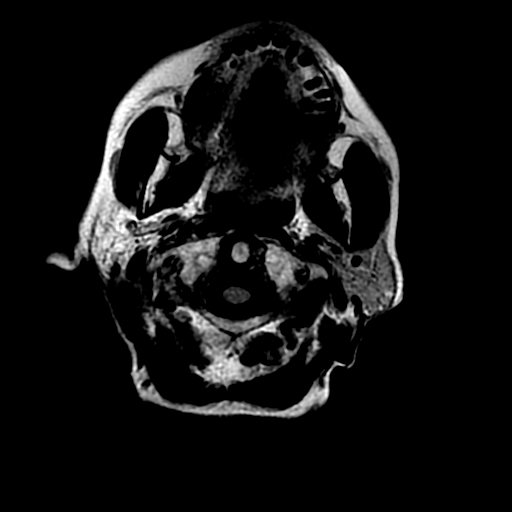
[im 26/26]
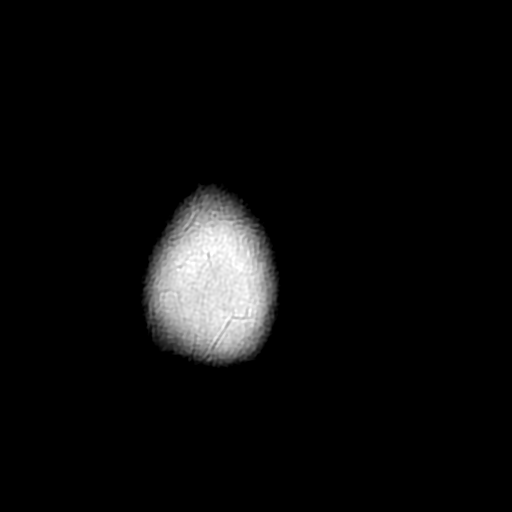

[Series 14: T1 post-contrast · coronal · 5.0mm · 0.45mm/px · 2 of 28 slices shown (1 of 2)]
[im 1/28]
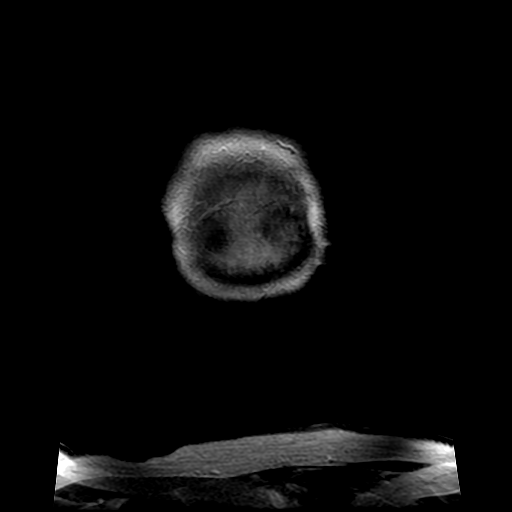
[im 28/28]
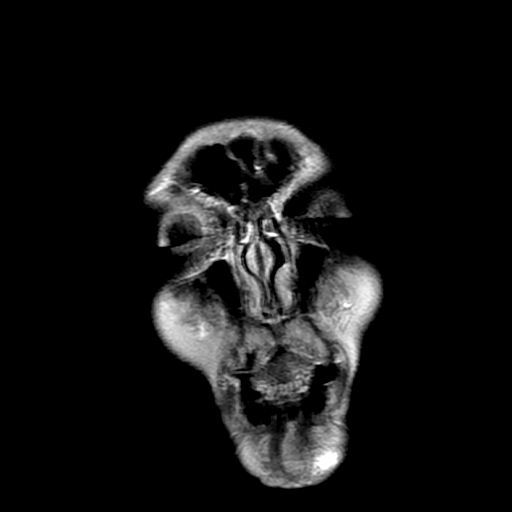

[Series 15: T1 post-contrast · sagittal · 5.0mm · 0.47mm/px · 2 of 24 slices shown (2 of 2)]
[im 1/24]
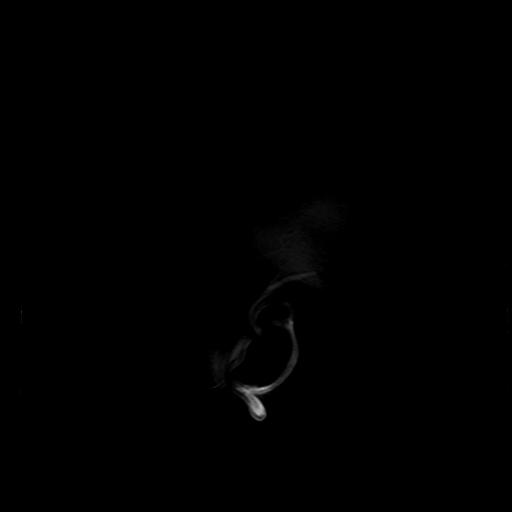
[im 24/24]
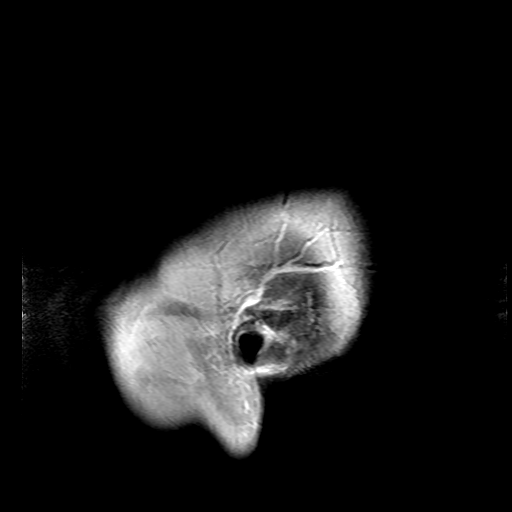

[Series 400: DWI · axial · 3.0mm · 1.09mm/px · z∈[-48,+93]mm · 4 of 44 slices shown (3 of 4)]
[im 1/44]
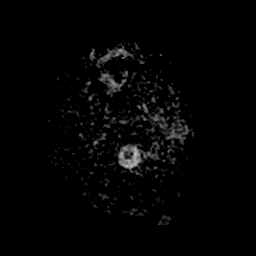
[im 15/44]
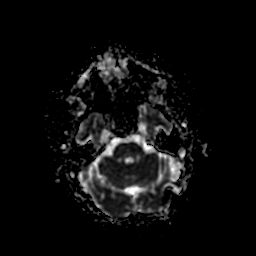
[im 29/44]
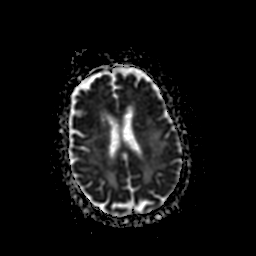
[im 44/44]
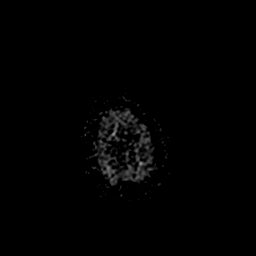

[Series 500: DWI · coronal · 5.0mm · 1.09mm/px · 3 of 35 slices shown (4 of 4)]
[im 1/35]
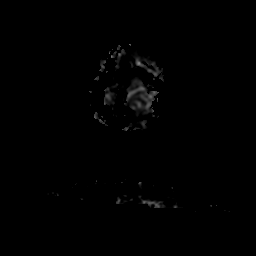
[im 18/35]
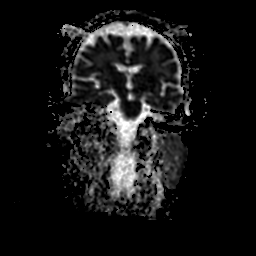
[im 35/35]
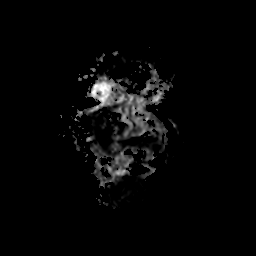

[33 of 48 positions shown; findings below may reference images not displayed]

FINDINGS: Exam is motion degraded.

Brain: Post left occipital craniotomy with minimal nodular
enhancement along the resection cavity similar to prior exams
suggestive of postoperative granulation tissue.

No new enhancing lesion to suggest intracranial metastatic disease.

Marked confluent white matter changes supratentorial and
infratentorial region may represent result of treatment of tumor.

No acute infarct.

Tiny blood breakdown products right temporal lobe pain right
cerebellum unchanged probably related to prior episode hemorrhagic
ischemia/treatment tumor.

Moderate global atrophy without hydrocephalus.

Vascular: Major intracranial vascular structures are patent.

Skull and upper cervical spine: No destructive lesion.

Sinuses/Orbits: No acute orbital abnormality.

Opacification anterior left ethmoid sinus air cell.

Other: Negative.
IMPRESSION: Exam is motion degraded.

Post left occipital craniotomy with minimal nodular enhancement
along the resection cavity similar to prior exams suggestive of
postoperative granulation tissue.

No new enhancing lesion to suggest intracranial metastatic disease.

Marked confluent white matter changes supratentorial and
infratentorial region may represent result of treatment of tumor.

No acute infarct.

## 2018-08-21 MED FILL — TAGRISSO 80 MG TABLET: 80 | 30 days supply | Qty: 30 | Fill #1

## 2018-08-26 ENCOUNTER — Inpatient Hospital Stay: Payer: Medicare Other | Admitting: Internal Medicine

## 2018-08-26 ENCOUNTER — Inpatient Hospital Stay: Payer: Medicare Other | Attending: Internal Medicine

## 2018-08-27 ENCOUNTER — Telehealth: Payer: Self-pay | Admitting: *Deleted

## 2018-08-27 NOTE — Telephone Encounter (Signed)
Received TC from patient inquiring about her appts. She was not sure when her next appt was. Informed pt that her appt was scheduled for yesterday.  Informed pt that I would send message to scheduling dept. to reschedule her lab and MD appts. Pt voiced understanding.

## 2018-08-28 ENCOUNTER — Telehealth: Payer: Self-pay | Admitting: Internal Medicine

## 2018-08-28 NOTE — Telephone Encounter (Signed)
called patient per 1/8 sch message - called both numbers - no answer - left message on mobile for patient to call back and r/s

## 2018-09-13 ENCOUNTER — Emergency Department (HOSPITAL_COMMUNITY)
Admission: EM | Admit: 2018-09-13 | Discharge: 2018-09-13 | Disposition: A | Discharge status: Home / Self Care | Payer: Medicare Other | Attending: Emergency Medicine | Admitting: Emergency Medicine

## 2018-09-13 ENCOUNTER — Emergency Department (HOSPITAL_COMMUNITY): Payer: Medicare Other

## 2018-09-13 ENCOUNTER — Other Ambulatory Visit: Payer: Self-pay

## 2018-09-13 ENCOUNTER — Encounter (HOSPITAL_COMMUNITY): Payer: Self-pay | Admitting: Emergency Medicine

## 2018-09-13 DIAGNOSIS — Z85841 Personal history of malignant neoplasm of brain: Secondary | ICD-10-CM | POA: Diagnosis not present

## 2018-09-13 DIAGNOSIS — S9002XA Contusion of left ankle, initial encounter: Secondary | ICD-10-CM | POA: Insufficient documentation

## 2018-09-13 DIAGNOSIS — I10 Essential (primary) hypertension: Secondary | ICD-10-CM | POA: Insufficient documentation

## 2018-09-13 DIAGNOSIS — F419 Anxiety disorder, unspecified: Secondary | ICD-10-CM | POA: Insufficient documentation

## 2018-09-13 DIAGNOSIS — J069 Acute upper respiratory infection, unspecified: Secondary | ICD-10-CM | POA: Diagnosis not present

## 2018-09-13 DIAGNOSIS — Z8579 Personal history of other malignant neoplasms of lymphoid, hematopoietic and related tissues: Secondary | ICD-10-CM | POA: Diagnosis not present

## 2018-09-13 DIAGNOSIS — F0390 Unspecified dementia without behavioral disturbance: Secondary | ICD-10-CM | POA: Insufficient documentation

## 2018-09-13 DIAGNOSIS — Y998 Other external cause status: Secondary | ICD-10-CM | POA: Diagnosis not present

## 2018-09-13 DIAGNOSIS — Z79899 Other long term (current) drug therapy: Secondary | ICD-10-CM | POA: Insufficient documentation

## 2018-09-13 DIAGNOSIS — Z85118 Personal history of other malignant neoplasm of bronchus and lung: Secondary | ICD-10-CM | POA: Diagnosis not present

## 2018-09-13 DIAGNOSIS — S99912A Unspecified injury of left ankle, initial encounter: Secondary | ICD-10-CM | POA: Diagnosis present

## 2018-09-13 DIAGNOSIS — M25552 Pain in left hip: Secondary | ICD-10-CM | POA: Insufficient documentation

## 2018-09-13 DIAGNOSIS — Y9389 Activity, other specified: Secondary | ICD-10-CM | POA: Insufficient documentation

## 2018-09-13 DIAGNOSIS — Z7901 Long term (current) use of anticoagulants: Secondary | ICD-10-CM | POA: Diagnosis not present

## 2018-09-13 DIAGNOSIS — Z85850 Personal history of malignant neoplasm of thyroid: Secondary | ICD-10-CM | POA: Diagnosis not present

## 2018-09-13 DIAGNOSIS — M25512 Pain in left shoulder: Secondary | ICD-10-CM | POA: Diagnosis not present

## 2018-09-13 DIAGNOSIS — Y92003 Bedroom of unspecified non-institutional (private) residence as the place of occurrence of the external cause: Secondary | ICD-10-CM | POA: Insufficient documentation

## 2018-09-13 DIAGNOSIS — E860 Dehydration: Secondary | ICD-10-CM | POA: Diagnosis not present

## 2018-09-13 DIAGNOSIS — N3000 Acute cystitis without hematuria: Secondary | ICD-10-CM | POA: Diagnosis not present

## 2018-09-13 DIAGNOSIS — W19XXXA Unspecified fall, initial encounter: Secondary | ICD-10-CM

## 2018-09-13 DIAGNOSIS — W06XXXA Fall from bed, initial encounter: Secondary | ICD-10-CM | POA: Insufficient documentation

## 2018-09-13 LAB — COMPREHENSIVE METABOLIC PANEL
ALT: 14 U/L (ref 0–44)
AST: 23 U/L (ref 15–41)
Albumin: 4.4 g/dL (ref 3.5–5.0)
Alkaline Phosphatase: 57 U/L (ref 38–126)
Anion gap: 9 (ref 5–15)
BUN: 19 mg/dL (ref 8–23)
CO2: 25 mmol/L (ref 22–32)
Calcium: 9.5 mg/dL (ref 8.9–10.3)
Chloride: 104 mmol/L (ref 98–111)
Creatinine, Ser: 0.9 mg/dL (ref 0.44–1.00)
GFR calc Af Amer: >60 mL/min (ref >60)
GFR calc non Af Amer: >60 mL/min (ref >60)
Glucose, Bld: 132 mg/dL — ABNORMAL HIGH (ref 70–99)
Potassium: 3.9 mmol/L (ref 3.5–5.1)
Sodium: 138 mmol/L (ref 135–145)
Total Bilirubin: 0.7 mg/dL (ref 0.3–1.2)
Total Protein: 7 g/dL (ref 6.5–8.1)

## 2018-09-13 LAB — CBC WITH DIFFERENTIAL/PLATELET
Abs Immature Granulocytes: 0.11 10*3/uL — ABNORMAL HIGH (ref 0.00–0.07)
Basophils Absolute: 0 10*3/uL (ref 0.0–0.1)
Basophils Relative: 0 %
Eosinophils Absolute: 0 10*3/uL (ref 0.0–0.5)
Eosinophils Relative: 0 %
HCT: 39.6 % (ref 36.0–46.0)
Hemoglobin: 12.6 g/dL (ref 12.0–15.0)
Immature Granulocytes: 1 %
Lymphocytes Relative: 3 %
Lymphs Abs: 0.4 10*3/uL — ABNORMAL LOW (ref 0.7–4.0)
MCH: 29.8 pg (ref 26.0–34.0)
MCHC: 31.8 g/dL (ref 30.0–36.0)
MCV: 93.6 fL (ref 80.0–100.0)
Monocytes Absolute: 0.9 10*3/uL (ref 0.1–1.0)
Monocytes Relative: 9 %
Neutro Abs: 9.5 10*3/uL — ABNORMAL HIGH (ref 1.7–7.7)
Neutrophils Relative %: 87 %
Platelets: 145 10*3/uL — ABNORMAL LOW (ref 150–400)
RBC: 4.23 MIL/uL (ref 3.87–5.11)
RDW: 13.4 % (ref 11.5–15.5)
WBC: 10.9 10*3/uL — ABNORMAL HIGH (ref 4.0–10.5)
nRBC: 0 % (ref 0.0–0.2)

## 2018-09-13 LAB — URINALYSIS, ROUTINE W REFLEX MICROSCOPIC
Bilirubin Urine: NEGATIVE
Glucose, UA: NEGATIVE mg/dL
Ketones, ur: 20 mg/dL — AB
Leukocytes, UA: NEGATIVE
Nitrite: NEGATIVE
Protein, ur: 30 mg/dL — AB
Specific Gravity, Urine: 1.026 (ref 1.005–1.030)
pH: 5 (ref 5.0–8.0)

## 2018-09-13 LAB — TROPONIN I: Troponin I: <0.03 ng/mL (ref <0.03)

## 2018-09-13 MED ORDER — CEPHALEXIN 500 MG PO CAPS: 500.0000 mg | ORAL_CAPSULE | Freq: Three times a day (TID) | ORAL | 0 refills | Status: DC

## 2018-09-13 MED ORDER — SODIUM CHLORIDE 0.9 % IV SOLN
2.0000 g | Freq: Once | INTRAVENOUS | Status: AC
  Administered 2018-09-13: 2 g via INTRAVENOUS
  Filled 2018-09-13: qty 20

## 2018-09-13 MED ORDER — SODIUM CHLORIDE 0.9 % IV BOLUS
1000.0000 mL | Freq: Once | INTRAVENOUS | Status: AC
  Administered 2018-09-13: 1000 mL via INTRAVENOUS

## 2018-09-13 MED ORDER — DOXYCYCLINE HYCLATE 100 MG PO CAPS: 100.0000 mg | ORAL_CAPSULE | Freq: Two times a day (BID) | ORAL | 0 refills | Status: AC

## 2018-09-13 NOTE — ED Notes (Signed)
Patient stated that she will only stand if she has her walker. She refused help ambulating because, "I have fallen too many times." She is attempting to call her family member to bring her walker to the ED.

## 2018-09-13 NOTE — ED Notes (Signed)
Called main lab to make them aware that new blood specimens for re-collect were sent on patient. Also informed lab that printer in patient's room not working and IT made aware, so lab specimens were sent with white patient labels.

## 2018-09-13 NOTE — ED Notes (Signed)
Bed: LS93 Expected date:  Expected time:  Means of arrival:  Comments: 73 yo fall, cough, gen pain, Lung CA

## 2018-09-13 NOTE — ED Provider Notes (Signed)
  Physical Exam  BP 117/66   Pulse 81   Temp 98 F (36.7 C) (Oral)   Resp 19   SpO2 95%   Physical Exam  ED Course/Procedures     Procedures  MDM  Received patient in signout.  Has walker and will try at home.  Able to stand here.  Has UTI and dehydration is gotten fluids and IV antibiotics.  Culture sent on the urine.  Discharge home.       Davonna Belling, MD 09/13/18 1755

## 2018-09-13 NOTE — ED Notes (Signed)
Pt hard stick with no veins due to years of cancer treatments. [name] RN aware to do an ultrasound for blood work Teacher, English as a foreign language.

## 2018-09-13 NOTE — ED Notes (Signed)
Lab reports they need recollect on labs. Primary RN made aware

## 2018-09-13 NOTE — ED Notes (Signed)
ED Provider at bedside. 

## 2018-09-13 NOTE — ED Provider Notes (Signed)
[facility] DEPT Provider Note   CSN: [id number] Arrival date & time: 09/13/18  1132     History   Chief Complaint Chief Complaint  Patient presents with  . Fall  . Foot Pain    HPI Valerie [name] is a 73 y.o. female.  HPI very pleasant 73 year old female with history of metastatic lung cancer here with multiple complaints.  The patient states that over the last week, she has been coughing more than usual.  She is producing copious amounts of yellow-green sputum.  She has not had any fevers.  Denies any recent changes in her medications.  Over the last 24 hours, she has felt generally weak, although she has not had any chest pain or other pain.  This morning, she felt generally weak.  She was wearing a slick dress and reportedly slid out of her bed while trying to get out of it.  She landed on her left side.  She has associated aching, throbbing, left shoulder and left hip pain.  She has not been able to ambulate since the fall.  She was down for approximately 1 hour.  No abdominal pain.  Her shortness of breath is not worsened after the fall.  She is adamant she did not hit her head or lose consciousness.  No neck pain.  Past Medical History:  Diagnosis Date  . Abnormal Pap smear 2006  . Anal fistula   . Ankle fracture   . Anxiety   . Arthritis   . Atrophic vaginitis 2008  . Cataract   . Dementia ([place]) 2009  . Dyspareunia 2008  . H/O osteoporosis   . H/O varicella   . H/O vitamin D deficiency   . Headache 07/24/2016  . Heart murmur   . History of measles, mumps, or rubella   . History of radiation therapy 07/28/13- 08/10/13   right lung metastasis 5000 cGy 10 sessions  . Hypertension   . Lung cancer ([place]) dx'd 2002  . Lung cancer ([place])   . Lung cancer ([place])   . Lung metastasis ([place])    PET scan 05/05/13, RUL lung nodule  . 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 Nodule of right lung CT-  06/03/12   RIGHT UPPER LOBE  . Nodule of right lung 06/03/12   Upper Lobe  . On antineoplastic chemotherapy    TARCEVA  . Osteoporosis 2010  . Primary cancer of right upper lobe of lung ([place]) 04/22/2009   Qualifier: Diagnosis of  By: Nils Pyle CMA ([place]), Mearl Latin    . Shortness of breath    hx lung ca   . Status post chemotherapy 2003   CARBOPLATIN/PACLITAXEL /STATUS POST CLINICAL TRAIL OF CELEBREX AND IRESSA AT [facility] FOR 1 YEAR  . Status post radiation therapy 2003   LEFT LUNG  . Status post radiation therapy 11/07/2005   WHOLE BRAIN: DR Larkin [facility]  . Status post radiation therapy 06/02/2008   GAMMA KNIFE OF RESECTED CAVITAY  . Thyroid adenoma    ?  [name] Thyroid cancer ([place]) 10/18/11 bx   adenoid nodules   . Yeast infection     Patient Active Problem List   Diagnosis Date Noted  . Headache 07/24/2016  . Chronic diarrhea 08/24/2015  . Sepsis ([place])   . Encounter for antineoplastic chemotherapy 01/31/2015  . Urinary retention 10/20/2014  . Ileus ([place]) 10/20/2014  . Cellulitis of leg, right 10/20/2014  . DVT, lower extremity ([place])  10/20/2014  . Fracture of right proximal fibula   . Ankle fracture 10/02/2014  . Ankle fracture 10/02/2014  . Lung cancer ([place]) 10/02/2014  . Closed fibular fracture 10/02/2014  . Dementia ([place]) 10/02/2014  . UTI (lower urinary tract infection) 10/02/2014  . Reactive airway disease 10/02/2014  . Fall 10/02/2014  . Closed left ankle fracture 10/01/2014  . Right fibular fracture 10/01/2014  . Lung metastasis ([place])   . LESION, ANUS 04/26/2009  . DIARRHEA 04/26/2009  . Primary cancer of right upper lobe of lung ([place]) 04/22/2009  . Malignant neoplasm of brain ([place]) 04/22/2009  . VITAMIN D DEFICIENCY 04/22/2009  . Dementia ([place]) 04/22/2009  . ANAL FISTULA 04/22/2009  . OSTEOPOROSIS 04/22/2009    Past Surgical History:  Procedure Laterality Date  . BRAIN SURGERY    . LEFT LOWER LOBECTOMY  05/2001  . LEFT OCCIPITAL CRANIOTOMY  09/21/2005   tumor  .  LUNG LOBECTOMY    . NECK SURGERY    . ORIF ANKLE FRACTURE Left 10/02/2014   Procedure: OPEN REDUCTION INTERNAL FIXATION (ORIF) ANKLE FRACTURE;  Surgeon: Alta Corning, MD;  Location: [facility];  Service: Orthopedics;  Laterality: Left;  . RADICAL NECK DISSECTION  10/18/2011   Procedure: RADICAL NECK DISSECTION;  Surgeon: Izora Gala, MD;  Location: [facility];  Service: ENT;  Laterality: Right;  RIGHT MODIFIED NECK DISSECTION /POSSIBLE RIGHT THYROIDECTOM  . THYROIDECTOMY  10/18/2011   Procedure: THYROIDECTOMY WITH RADICAL NECK DISSECTION;  Surgeon: Izora Gala, MD;  Location: [facility];  Service: ENT;  Laterality: Right;     OB History    Gravida  2   Para  0   Term  0   Preterm  0   AB  0   Living  2     SAB  0   TAB  0   Ectopic  0   Multiple      Live Births  2            Home Medications    Prior to Admission medications   Medication Sig Start Date End Date Taking? Authorizing Provider  Ascorbic Acid (VITAMIN C) 100 MG tablet Take 100 mg by mouth every evening.    Yes [provider]  cholecalciferol (VITAMIN D) 1000 UNITS tablet Take 1,000 Units by mouth every evening.    Yes [provider]  donepezil (ARICEPT) 10 MG tablet Take 10 mg by mouth every evening.  12/31/14  Yes [provider]  loperamide (IMODIUM) 2 MG capsule Take 2 mg by mouth as needed for diarrhea or loose stools.    Yes [provider]  Multiple Vitamin (MULTI-VITAMINS) TABS Take 1 tablet by mouth every evening.    Yes [provider]  TAGRISSO 80 MG tablet TAKE 1 TABLET (80MG ) BY MOUTH DAILY. Patient taking differently: Take 80 mg by mouth every evening.  07/15/18  Yes Curt Bears, MD  vitamin B-12 (CYANOCOBALAMIN) 1000 MCG tablet Take 1,000 mcg by mouth every evening.   Yes [provider]  XARELTO 20 MG TABS tablet Take 20 mg by mouth every evening.  11/29/15  Yes [provider]  cephALEXin (KEFLEX) 500 MG capsule Take 1 capsule (500 mg  total) by mouth 3 (three) times daily for 7 days. 09/13/18 09/20/18  Duffy Bruce, MD  doxycycline (VIBRAMYCIN) 100 MG capsule Take 1 capsule (100 mg total) by mouth 2 (two) times daily for 7 days. 09/13/18 09/20/18  Duffy Bruce, MD    Family History Family History  Problem Relation Age of Onset  . Gait disorder Mother   . Cancer Mother        Meningioma  . Cancer Father        Pancreatic  . Heart failure Sister   . Cancer Maternal Aunt        menigeoma    Social History Social History   Tobacco Use  . Smoking status: Never Smoker  . Smokeless tobacco: Never Used  . Tobacco comment: smoked few years in college  Substance Use Topics  . Alcohol use: No  . Drug use: No     Allergies   Patient has no known allergies.   Review of Systems Review of Systems  Constitutional: Positive for fatigue. Negative for chills and fever.  HENT: Negative for congestion and rhinorrhea.   Eyes: Negative for visual disturbance.  Respiratory: Positive for cough and shortness of breath. Negative for wheezing.   Cardiovascular: Negative for chest pain and leg swelling.  Gastrointestinal: Negative for abdominal pain, diarrhea, nausea and vomiting.  Genitourinary: Negative for dysuria and flank pain.  Musculoskeletal: Positive for arthralgias and back pain. Negative for neck pain and neck stiffness.  Skin: Negative for rash and wound.  Allergic/Immunologic: Negative for immunocompromised state.  Neurological: Positive for weakness. Negative for syncope and headaches.  All other systems reviewed and are negative.    Physical Exam Updated Vital Signs BP 130/86 (BP Location: Left Arm)   Pulse 86   Temp 98 F (36.7 C) (Oral)   Resp 14   SpO2 100%   Physical Exam Vitals signs and nursing note reviewed.  Constitutional:      General: She is not in acute distress.    Appearance: She is well-developed.  HENT:     Head: Normocephalic and atraumatic.  Eyes:     Conjunctiva/sclera:  Conjunctivae normal.  Neck:     Musculoskeletal: Neck supple.  Cardiovascular:     Rate and Rhythm: Normal rate and regular rhythm.     Heart sounds: Normal heart sounds. No murmur. No friction rub.  Pulmonary:     Effort: Pulmonary effort is normal. No respiratory distress.     Breath sounds: Examination of the left-lower field reveals decreased breath sounds. Decreased breath sounds and rales present. No wheezing.  Abdominal:     General: There is no distension.     Palpations: Abdomen is soft.     Tenderness: There is no abdominal tenderness.  Skin:    General: Skin is warm.     Capillary Refill: Capillary refill takes less than 2 seconds.  Neurological:     Mental Status: She is alert and oriented to person, place, and time.     Motor: No abnormal muscle tone.      ED Treatments / Results  Labs (all labs ordered are listed, but only abnormal results are displayed) Labs Reviewed  CBC WITH DIFFERENTIAL/PLATELET - Abnormal; Notable for the following components:      Result Value   WBC 10.9 (*)    Platelets 145 (*)    Neutro Abs 9.5 (*)    Lymphs Abs 0.4 (*)    Abs Immature Granulocytes 0.11 (*)    All other components within normal limits  URINALYSIS, ROUTINE W REFLEX MICROSCOPIC - Abnormal; Notable for the following components:   APPearance HAZY (*)    Hgb urine dipstick LARGE (*)    Ketones, ur 20 (*)    Protein, ur 30 (*)    Bacteria, UA FEW (*)  All other components within normal limits  COMPREHENSIVE METABOLIC PANEL - Abnormal; Notable for the following components:   Glucose, Bld 132 (*)    All other components within normal limits  URINE CULTURE  TROPONIN I    EKG EKG Interpretation  Date/Time:  Saturday September 13 2018 13:11:01 EST Ventricular Rate:  83 PR Interval:    QRS Duration: 91 QT Interval:  425 QTC Calculation: 500 R Axis:   50 Text Interpretation:  Sinus rhythm Borderline short PR interval Borderline prolonged QT interval No significant  change since last tracing Confirmed by Duffy Bruce [phone]) on 09/13/2018 3:20:03 PM   Radiology Dg Chest 2 View  Result Date: 09/13/2018 CLINICAL DATA:  Fall in bedroom today. Lung carcinoma. EXAM: CHEST - 2 VIEW COMPARISON:  08/24/2015 FINDINGS: Heart size is stable. Marked elevation of left hemidiaphragm is again seen. Stable postsurgical changes in the left hilar region with superior retraction. No evidence of acute infiltrate or pleural effusion. Is IMPRESSION: Stable postsurgical changes in left hemithorax.  No acute findings. Electronically Signed   By: Earle Gell M.D.   On: 09/13/2018 13:15   Dg Tibia/fibula Left  Result Date: 09/13/2018 CLINICAL DATA:  Pain post fall. EXAM: LEFT TIBIA AND FIBULA - 2 VIEW COMPARISON:  None. FINDINGS: There is no evidence of fracture or other focal bone lesions. Prior bimalleolar fixation without evidence of hardware failure. Soft tissues are unremarkable. IMPRESSION: No acute fracture or dislocation identified about the left tibia and fibula. Electronically Signed   By: Fidela Salisbury M.D.   On: 09/13/2018 16:10   Dg Ankle Complete Left  Result Date: 09/13/2018 CLINICAL DATA:  Pain and bruising to the lateral left ankle post fall. EXAM: LEFT ANKLE COMPLETE - 3+ VIEW COMPARISON:  None. FINDINGS: There is no evidence of fracture, dislocation, or joint effusion. Intact sideplate and screw fixation of distal fibula and 2 cancellous screw fixation of the medial malleolus. Mild soft tissues swelling about the medial malleolus. IMPRESSION: No acute fracture or dislocation identified about the left ankle. Electronically Signed   By: Fidela Salisbury M.D.   On: 09/13/2018 16:08   Ct Head Wo Contrast  Result Date: 09/13/2018 CLINICAL DATA:  Generalized weakness. Fell today. Found on the ground. EXAM: CT HEAD WITHOUT CONTRAST TECHNIQUE: Contiguous axial images were obtained from the base of the skull through the vertex without intravenous contrast.  COMPARISON:  MRI 10/01/2017 and multiple previous neck CT studies and brain MR 06/08/2015 FINDINGS: Brain: Generalized atrophy. Chronic small-vessel ischemic changes throughout the cerebral hemispheric white matter. There is mineralization along the gyri of the occipital lobes more extensive on the left than the right, consistent with post radiation mineralization. There is no sign of acute infarction, mass lesion, hemorrhage, hydrocephalus or extra-axial collection. Vascular: There is atherosclerotic calcification of the major vessels at the base of the brain. Skull: Previous left occipital craniotomy. Sinuses/Orbits: Clear/normal Other: None IMPRESSION: No acute finding by CT. Extensive chronic atrophy and chronic small-vessel ischemic changes throughout the cerebral hemispheric white matter. Old left occipital craniotomy. Mineralization of the occipital lobe gyri, more extensive on the left than the right, likely secondary to previous radiation. Electronically Signed   By: Nelson Chimes M.D.   On: 09/13/2018 13:23   Dg Shoulder Left  Result Date: 09/13/2018 CLINICAL DATA:  Is fall in bedroom today. Left shoulder pain. Initial encounter. EXAM: LEFT SHOULDER - 2+ VIEW COMPARISON:  None. FINDINGS: There is no evidence of fracture or dislocation. There is no evidence of arthropathy  or other focal bone abnormality. Generalized osteopenia noted. Multiple old left rib fracture deformities noted. IMPRESSION: No acute findings. Electronically Signed   By: Earle Gell M.D.   On: 09/13/2018 13:16   Dg Hip Unilat W Or Wo Pelvis 2-3 Views Left  Result Date: 09/13/2018 CLINICAL DATA:  Fall in bedroom today. Left hip pain. Initial encounter. EXAM: DG HIP (WITH OR WITHOUT PELVIS) 2-3V LEFT COMPARISON:  None. FINDINGS: There is no evidence of hip fracture or dislocation. Mild degenerative spurring of left acetabulum seen without joint space narrowing. IMPRESSION: No acute findings. Electronically Signed   By: Earle Gell  M.D.   On: 09/13/2018 13:19    Procedures Procedures (including critical care time)  Medications Ordered in ED Medications  sodium chloride 0.9 % bolus 1,000 mL (0 mLs Intravenous Stopped 09/13/18 1730)  cefTRIAXone (ROCEPHIN) 2 g in sodium chloride 0.9 % 100 mL IVPB (0 g Intravenous Stopped 09/13/18 1700)     Initial Impression / Assessment and Plan / ED Course  I have reviewed the triage vital signs and the nursing notes.  Pertinent labs & imaging results that were available during my care of the patient were reviewed by me and considered in my medical decision making (see chart for details).     73 yo F here with fall. No signs of traumatic injury from the fall. She does have a mild leukocytosis and h/o recurrent UTIs, with UA concerning for UTI. Will give rocephin, fluids. She is otherwise well appearing and in NAD. She reports she's been fighting "a cold" for several days, which I suspect is contributing to some mild fatigue contributing to her fall. She has no HA, vision changes, or focal numbness or weakness to suggest CVA. CXR is clear and breath sounds are symmetric, with normal WOB and no evidence of PNA. Her abdomen is soft, NT, ND. No flank pain or sx or pyelo. She does have a h/o lung CA but is not neutropenic, or febrile.  Will plan to treat for mild dehydration, possible UTI vs bronchitis contributing to generalized weakness. Plan to ambulate after fluids and reassess. Family is very reasonable and would like to take pt home (husband is retired MD), which I think is reasonable.    Final Clinical Impressions(s) / ED Diagnoses   Final diagnoses:  Acute cystitis without hematuria  Dehydration  Fall, initial encounter  Upper respiratory tract infection, unspecified type    ED Discharge Orders         Ordered    cephALEXin (KEFLEX) 500 MG capsule  3 times daily     09/13/18 1621    doxycycline (VIBRAMYCIN) 100 MG capsule  2 times daily     09/13/18 1625             Duffy Bruce, MD 09/13/18 1952

## 2018-09-13 NOTE — ED Triage Notes (Signed)
Pt reports she went back to bed from bathroom and her slick gown slid on bed sheets and patient fell on floor. Reports her husband was at the gym so was on the floor for over hour. Pt c/o left foot pain.

## 2018-09-13 NOTE — ED Triage Notes (Signed)
Patient has had general weakness for a while now and fell today after laying down for 2 hours. She has had pain and a cough for 2 days now and believes she may have pneumonia. She has a history of lung and brain cancer.  EMS vitals: 110/62 BP 100 HR 92% O2 sats on room air 16 Resp rate

## 2018-09-13 NOTE — ED Notes (Signed)
Pt in imaging at the moment, unable to obtain ekg

## 2018-09-16 LAB — URINE CULTURE
Culture: 80000 — AB
Special Requests: NORMAL

## 2018-09-17 ENCOUNTER — Telehealth: Payer: Self-pay | Admitting: *Deleted

## 2018-09-17 NOTE — Telephone Encounter (Signed)
Post ED Visit - Positive Culture Follow-up  Culture report reviewed by antimicrobial stewardship pharmacist:  []  Elenor Quinones, Pharm.D. []  Heide Guile, Pharm.D., BCPS AQ-ID []  Parks Neptune, Pharm.D., BCPS []  Alycia Rossetti, Pharm.D., BCPS []  [place], Pharm.D., BCPS, AAHIVP []  Legrand Como, Pharm.D., BCPS, AAHIVP [x]  Salome Arnt, PharmD, BCPS []  Johnnette Gourd, PharmD, BCPS []  Hughes Better, PharmD, BCPS []  Leeroy Cha, PharmD  Positive urine culture Treated with Cephalexin, organism sensitive to the same and no further patient follow-up is required at this time.  Harlon Flor [facility] 09/17/2018, 10:56 AM

## 2018-10-01 ENCOUNTER — Telehealth: Payer: Self-pay | Admitting: Internal Medicine

## 2018-10-01 NOTE — Telephone Encounter (Signed)
MM PAL 2/25 - moved lab/fu to 3/9. Not able to reach patient at home phone (not in service). Left message on cell. Schedule mailed.

## 2018-10-02 MED FILL — TAGRISSO 80 MG TABLET: 80 | 30 days supply | Qty: 30 | Fill #2

## 2018-10-10 ENCOUNTER — Telehealth: Payer: Self-pay | Admitting: Internal Medicine

## 2018-10-10 NOTE — Telephone Encounter (Signed)
Called patient to reschedule appt.  Patient did not answer and was not able to leave a VM.

## 2018-10-14 ENCOUNTER — Telehealth: Payer: Self-pay | Admitting: Internal Medicine

## 2018-10-14 ENCOUNTER — Ambulatory Visit: Payer: Medicare Other | Admitting: Internal Medicine

## 2018-10-14 ENCOUNTER — Other Ambulatory Visit: Payer: Medicare Other

## 2018-10-14 ENCOUNTER — Telehealth: Payer: Self-pay | Admitting: Medical Oncology

## 2018-10-14 NOTE — Telephone Encounter (Signed)
Scheduled appt per 2/25 sch message - left message for patient on mobile phone with appt date and time

## 2018-10-14 NOTE — Telephone Encounter (Signed)
Requests to move appt to April . Message sent to scheduler.

## 2018-10-16 ENCOUNTER — Encounter: Payer: Self-pay | Admitting: Internal Medicine

## 2018-10-16 ENCOUNTER — Ambulatory Visit: Payer: Medicare Other

## 2018-10-16 ENCOUNTER — Ambulatory Visit (INDEPENDENT_AMBULATORY_CARE_PROVIDER_SITE_OTHER): Payer: Medicare Other | Admitting: Internal Medicine

## 2018-10-16 VITALS — BP 110/78 | HR 89 | Temp 97.4°F | Ht 67.0 in | Wt 173.6 lb

## 2018-10-16 DIAGNOSIS — Z0000 Encounter for general adult medical examination without abnormal findings: Secondary | ICD-10-CM

## 2018-10-16 DIAGNOSIS — E039 Hypothyroidism, unspecified: Secondary | ICD-10-CM

## 2018-10-16 DIAGNOSIS — C73 Malignant neoplasm of thyroid gland: Secondary | ICD-10-CM

## 2018-10-16 DIAGNOSIS — R3129 Other microscopic hematuria: Secondary | ICD-10-CM

## 2018-10-16 DIAGNOSIS — R7309 Other abnormal glucose: Secondary | ICD-10-CM | POA: Diagnosis not present

## 2018-10-16 LAB — POCT URINALYSIS DIPSTICK
Bilirubin, UA: NEGATIVE
Glucose, UA: NEGATIVE
Ketones, UA: NEGATIVE
Leukocytes, UA: NEGATIVE
Nitrite, UA: NEGATIVE
Protein, UA: NEGATIVE
Spec Grav, UA: >=1.03 — AB (ref 1.010–1.025)
Urobilinogen, UA: 0.2 E.U./dL
pH, UA: 5.5 (ref 5.0–8.0)

## 2018-10-16 MED ORDER — CIPROFLOXACIN HCL 250 MG PO TABS: 250.0000 mg | ORAL_TABLET | Freq: Two times a day (BID) | ORAL | 0 refills | Status: AC

## 2018-10-16 NOTE — Progress Notes (Signed)
Subjective:     Patient ID: Valerie [name] , female    DOB: March 20, 1946 , 73 y.o.   MRN: [id number]   Chief Complaint  Patient presents with  . Annual Exam    HPI Pt is here for Medicare wellness done by LPN, and was scheduled for a physical but her husband states she cent get up and undress and she just wants her urine checked. She ate a salad yesterday pm, and this am had one episode of diarrhea. She is prone to UTI's so she would like to have her UA checked. She denies dysuria or blood in urine. Always has frequency and incontinence.  Does not want a physical. Just a check up. She had diarrhea this am and since she wears depends is prone to getting UTI's. There has been times her urine shows blood only and her cultures have grown Ecolili and once Klebsiella   Past Medical History:  Diagnosis Date  . Abnormal Pap smear 2006  . Anal fistula   . Ankle fracture   . Anxiety   . Arthritis   . Atrophic vaginitis 2008  . Cataract   . Dementia ([place]) 2009  . Dyspareunia 2008  . H/O osteoporosis   . H/O varicella   . H/O vitamin D deficiency   . Headache 07/24/2016  . Heart murmur   . History of measles, mumps, or rubella   . History of radiation therapy 07/28/13- 08/10/13   right lung metastasis 5000 cGy 10 sessions  . Hypertension   . Lung cancer ([place]) dx'd 2002  . Lung cancer ([place])   . Lung cancer ([place])   . Lung metastasis ([place])    PET scan 05/05/13, RUL lung nodule  . 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 Nodule of right lung CT- 06/03/12   RIGHT UPPER LOBE  . Nodule of right lung 06/03/12   Upper Lobe  . On antineoplastic chemotherapy    TARCEVA  . Osteoporosis 2010  . Primary cancer of right upper lobe of lung ([place]) 04/22/2009   Qualifier: Diagnosis of  By: Nils Pyle CMA ([place]), Mearl Latin    . Shortness of breath    hx lung ca   . Status post chemotherapy 2003   CARBOPLATIN/PACLITAXEL /STATUS POST CLINICAL TRAIL OF CELEBREX  AND IRESSA AT [facility] FOR 1 YEAR  . Status post radiation therapy 2003   LEFT LUNG  . Status post radiation therapy 11/07/2005   WHOLE BRAIN: DR Larkin [facility]  . Status post radiation therapy 06/02/2008   GAMMA KNIFE OF RESECTED CAVITAY  . Thyroid adenoma    ?  [name] Thyroid cancer ([place]) 10/18/11 bx   adenoid nodules   . Yeast infection      Family History  Problem Relation Age of Onset  . Gait disorder Mother   . Cancer Mother        Meningioma  . Cancer Father        Pancreatic  . Heart failure Sister   . Cancer Maternal Aunt        menigeoma     Current Outpatient Medications:  .  Ascorbic Acid (VITAMIN C) 100 MG tablet, Take 100 mg by mouth every evening. , Disp: , Rfl:  .  cholecalciferol (VITAMIN D) 1000 UNITS tablet, Take 1,000 Units by mouth every evening. , Disp: , Rfl:  .  donepezil (ARICEPT) 10 MG tablet, Take 10 mg by mouth every evening. , Disp: , Rfl:  0 .  loperamide (IMODIUM) 2 MG capsule, Take 2 mg by mouth as needed for diarrhea or loose stools. , Disp: , Rfl:  .  Multiple Vitamin (MULTI-VITAMINS) TABS, Take 1 tablet by mouth every evening. , Disp: , Rfl:  .  TAGRISSO 80 MG tablet, TAKE 1 TABLET (80MG ) BY MOUTH DAILY. (Patient taking differently: Take 80 mg by mouth every evening. ), Disp: 30 tablet, Rfl: 2 .  vitamin B-12 (CYANOCOBALAMIN) 1000 MCG tablet, Take 1,000 mcg by mouth every evening., Disp: , Rfl:  .  XARELTO 20 MG TABS tablet, Take 20 mg by mouth every evening. , Disp: , Rfl:    No Known Allergies   Review of Systems  Constitutional: Positive for fatigue. Negative for fever.  HENT: Negative for congestion.   Respiratory: Negative for cough.   Gastrointestinal: Negative for nausea.  Genitourinary: Positive for frequency. Negative for difficulty urinating, dysuria and urgency.  Musculoskeletal: Positive for gait problem.  Skin: Negative for rash.    Today's Vitals   10/16/18 1147  BP: 110/78  Pulse: 89  Temp: (!) 97.4 F (36.3 C)  TempSrc: Oral   SpO2: 97%  Weight: 173 lb 9.6 oz (78.7 kg)  Height: 5\' 7"  (1.702 m)   Body mass index is 27.19 kg/m.   Objective:  Physical Exam Vitals signs and nursing note reviewed.  HENT:     Right Ear: External ear normal.     Left Ear: External ear normal.     Nose: Nose normal.  Eyes:     General: No scleral icterus.    Conjunctiva/sclera: Conjunctivae normal.  Neck:     Musculoskeletal: Neck supple. No neck rigidity.  Cardiovascular:     Rate and Rhythm: Normal rate and regular rhythm.     Pulses: Normal pulses.     Heart sounds: No murmur.  Pulmonary:     Effort: Pulmonary effort is normal.     Breath sounds: Normal breath sounds.  Abdominal:     General: Bowel sounds are normal. There is no distension.     Palpations: Abdomen is soft. There is no mass.     Tenderness: There is no abdominal tenderness. There is no guarding or rebound.     Hernia: No hernia is present.  Musculoskeletal: Normal range of motion.        General: No deformity.     Right lower leg: No edema.     Left lower leg: No edema.  Lymphadenopathy:     Cervical: No cervical adenopathy.  Skin:    General: Skin is warm and dry.     Findings: No erythema or rash.  Neurological:     Mental Status: She is alert and oriented to person, place, and time.     Comments: Uses a walker to ambulate  Psychiatric:        Mood and Affect: Mood normal.        Behavior: Behavior normal.        Thought Content: Thought content normal.        Judgment: Judgment normal.    UA- shows neg leuks and large blood.  Assessment And Plan:   1. Hypothyroidism, unspecified type- chronic - TSH - T3, free - T4, Free - CMP14 + Anion Gap - CBC no Diff - Lipid Profile - POCT Urinalysis Dipstick (81002)  2. Other microscopic hematuria- I offered to wither wait for the culture before treatment or if she wanted to go ahead and be treated and she opted to  start treatment, so I placed her on cipro 250 mg bid x 7 days.  - Culture,  Urine  3- Abnormal glucose- I ordered a HGBA1c.  FUf with provider in 3 months.  SYLVIA RODRIGUEZ-SOUTHWORTH, PA-C

## 2018-10-16 NOTE — Patient Instructions (Signed)
Valerie [name] , Thank you for taking time to come for your Medicare Wellness Visit. I appreciate your ongoing commitment to your health goals. Please review the following plan we discussed and let me know if I can assist you in the future.   Screening recommendations/referrals: Colonoscopy: 08/2013 Mammogram: GYN orders Bone Density: 08/2017 Recommended yearly ophthalmology/optometry visit for glaucoma screening and checkup Recommended yearly dental visit for hygiene and checkup  Vaccinations: Influenza vaccine: 07/2018 Pneumococcal vaccine: 05/2015 Tdap vaccine: 10/2013 Shingles vaccine:  discussed    Advanced directives: Please bring a copy of your POA (Power of [place]) and/or Living Will to your next appointment.    Conditions/risks identified: Overweight  Next appointment: 12/18/2018 at 10:00   Preventive Care 17 Years and Older, Female Preventive care refers to lifestyle choices and visits with your health care provider that can promote health and wellness. What does preventive care include?  A yearly physical exam. This is also called an annual well check.  Dental exams once or twice a year.  Routine eye exams. Ask your health care provider how often you should have your eyes checked.  Personal lifestyle choices, including:  Daily care of your teeth and gums.  Regular physical activity.  Eating a healthy diet.  Avoiding tobacco and drug use.  Limiting alcohol use.  Practicing safe sex.  Taking low-dose aspirin every day.  Taking vitamin and mineral supplements as recommended by your health care provider. What happens during an annual well check? The services and screenings done by your health care provider during your annual well check will depend on your age, overall health, lifestyle risk factors, and family history of disease. Counseling  Your health care provider may ask you questions about your:  Alcohol use.  Tobacco use.  Drug use.  Emotional  well-being.  Home and relationship well-being.  Sexual activity.  Eating habits.  History of falls.  Memory and ability to understand (cognition).  Work and work Statistician.  Reproductive health. Screening  You may have the following tests or measurements:  Height, weight, and BMI.  Blood pressure.  Lipid and cholesterol levels. These may be checked every 5 years, or more frequently if you are over 64 years old.  Skin check.  Lung cancer screening. You may have this screening every year starting at age 51 if you have a 30-pack-year history of smoking and currently smoke or have quit within the past 15 years.  Fecal occult blood test (FOBT) of the stool. You may have this test every year starting at age 28.  Flexible sigmoidoscopy or colonoscopy. You may have a sigmoidoscopy every 5 years or a colonoscopy every 10 years starting at age 15.  Hepatitis C blood test.  Hepatitis B blood test.  Sexually transmitted disease (STD) testing.  Diabetes screening. This is done by checking your blood sugar (glucose) after you have not eaten for a while (fasting). You may have this done every 1-3 years.  Bone density scan. This is done to screen for osteoporosis. You may have this done starting at age 12.  Mammogram. This may be done every 1-2 years. Talk to your health care provider about how often you should have regular mammograms. Talk with your health care provider about your test results, treatment options, and if necessary, the need for more tests. Vaccines  Your health care provider may recommend certain vaccines, such as:  Influenza vaccine. This is recommended every year.  Tetanus, diphtheria, and acellular pertussis (Tdap, Td) vaccine. You may need a  Td booster every 10 years.  Zoster vaccine. You may need this after age 75.  Pneumococcal 13-valent conjugate (PCV13) vaccine. One dose is recommended after age 65.  Pneumococcal polysaccharide (PPSV23) vaccine. One  dose is recommended after age 47. Talk to your health care provider about which screenings and vaccines you need and how often you need them. This information is not intended to replace advice given to you by your health care provider. Make sure you discuss any questions you have with your health care provider. Document Released: 09/02/2015 Document Revised: 04/25/2016 Document Reviewed: 06/07/2015 Elsevier Interactive Patient Education  2017 [place] Prevention in the Home Falls can cause injuries. They can happen to people of all ages. There are many things you can do to make your home safe and to help prevent falls. What can I do on the outside of my home?  Regularly fix the edges of walkways and driveways and fix any cracks.  Remove anything that might make you trip as you walk through a door, such as a raised step or threshold.  Trim any bushes or trees on the path to your home.  Use bright outdoor lighting.  Clear any walking paths of anything that might make someone trip, such as rocks or tools.  Regularly check to see if handrails are loose or broken. Make sure that both sides of any steps have handrails.  Any raised decks and porches should have guardrails on the edges.  Have any leaves, snow, or ice cleared regularly.  Use sand or salt on walking paths during winter.  Clean up any spills in your garage right away. This includes oil or grease spills. What can I do in the bathroom?  Use night lights.  Install grab bars by the toilet and in the tub and shower. Do not use towel bars as grab bars.  Use non-skid mats or decals in the tub or shower.  If you need to sit down in the shower, use a plastic, non-slip stool.  Keep the floor dry. Clean up any water that spills on the floor as soon as it happens.  Remove soap buildup in the tub or shower regularly.  Attach bath mats securely with double-sided non-slip rug tape.  Do not have throw rugs and other  things on the floor that can make you trip. What can I do in the bedroom?  Use night lights.  Make sure that you have a light by your bed that is easy to reach.  Do not use any sheets or blankets that are too big for your bed. They should not hang down onto the floor.  Have a firm chair that has side arms. You can use this for support while you get dressed.  Do not have throw rugs and other things on the floor that can make you trip. What can I do in the kitchen?  Clean up any spills right away.  Avoid walking on wet floors.  Keep items that you use a lot in easy-to-reach places.  If you need to reach something above you, use a strong step stool that has a grab bar.  Keep electrical cords out of the way.  Do not use floor polish or wax that makes floors slippery. If you must use wax, use non-skid floor wax.  Do not have throw rugs and other things on the floor that can make you trip. What can I do with my stairs?  Do not leave any items on the stairs.  Make sure that there are handrails on both sides of the stairs and use them. Fix handrails that are broken or loose. Make sure that handrails are as long as the stairways.  Check any carpeting to make sure that it is firmly attached to the stairs. Fix any carpet that is loose or worn.  Avoid having throw rugs at the top or bottom of the stairs. If you do have throw rugs, attach them to the floor with carpet tape.  Make sure that you have a light switch at the top of the stairs and the bottom of the stairs. If you do not have them, ask someone to add them for you. What else can I do to help prevent falls?  Wear shoes that:  Do not have high heels.  Have rubber bottoms.  Are comfortable and fit you well.  Are closed at the toe. Do not wear sandals.  If you use a stepladder:  Make sure that it is fully opened. Do not climb a closed stepladder.  Make sure that both sides of the stepladder are locked into place.  Ask  someone to hold it for you, if possible.  Clearly mark and make sure that you can see:  Any grab bars or handrails.  First and last steps.  Where the edge of each step is.  Use tools that help you move around (mobility aids) if they are needed. These include:  Canes.  Walkers.  Scooters.  Crutches.  Turn on the lights when you go into a dark area. Replace any light bulbs as soon as they burn out.  Set up your furniture so you have a clear path. Avoid moving your furniture around.  If any of your floors are uneven, fix them.  If there are any pets around you, be aware of where they are.  Review your medicines with your doctor. Some medicines can make you feel dizzy. This can increase your chance of falling. Ask your doctor what other things that you can do to help prevent falls. This information is not intended to replace advice given to you by your health care provider. Make sure you discuss any questions you have with your health care provider. Document Released: 06/02/2009 Document Revised: 01/12/2016 Document Reviewed: 09/10/2014 Elsevier Interactive Patient Education  2017 [employer].

## 2018-10-16 NOTE — Progress Notes (Signed)
Subjective:   Valerie [name] is a 73 y.o. female who presents for Medicare Annual (Subsequent) preventive examination.  Review of Systems:  N/a  Cardiac Risk Factors include: advanced age (>22men, >45 women);dyslipidemia     Objective:     Vitals: BP 110/78 (BP Location: Left Arm, Patient Position: Sitting)   Pulse 89   Temp (!) 97.4 F (36.3 C) (Oral)   Ht 5\' 7"  (1.702 m)   Wt 173 lb 9.6 oz (78.7 kg)   SpO2 97%   BMI 27.19 kg/m   Body mass index is 27.19 kg/m.  Advanced Directives 10/16/2018 06/24/2018 04/24/2018 10/15/2017 06/04/2017 06/04/2017 03/26/2017  Does Patient Have a Medical Advance Directive? Yes No No No No No Yes  Type of Paramedic of [place];Living will - - - - - -  Does patient want to make changes to medical advance directive? No - Patient declined - - - - - -  Copy of [place] in Chart? No - copy requested - - - - - -  Would patient like information on creating a medical advance directive? - No - Patient declined - - - - -  Pre-existing out of facility DNR order (yellow form or pink MOST form) - - - - - - -    Tobacco Social History   Tobacco Use  Smoking Status Never Smoker  Smokeless Tobacco Never Used  Tobacco Comment   smoked few years in college     Counseling given: Not Answered Comment: smoked few years in college   Clinical Intake:  Pre-visit preparation completed: Yes  Pain : No/denies pain Pain Score: 0-No pain     Nutritional Status: BMI 25 -29 Overweight Nutritional Risks: Nausea/ vomitting/ diarrhea(side effect of medication) Diabetes: No  How often do you need to have someone help you when you read instructions, pamphlets, or other written materials from your doctor or pharmacy?: 1 - Never What is the last grade level you completed in school?: masters degree  Interpreter Needed?: No  Information entered by :: NAllen LPN  Past Medical History:  Diagnosis Date  . Abnormal  Pap smear 2006  . Anal fistula   . Ankle fracture   . Anxiety   . Arthritis   . Atrophic vaginitis 2008  . Cataract   . Dementia ([place]) 2009  . Dyspareunia 2008  . H/O osteoporosis   . H/O varicella   . H/O vitamin D deficiency   . Headache 07/24/2016  . Heart murmur   . History of measles, mumps, or rubella   . History of radiation therapy 07/28/13- 08/10/13   right lung metastasis 5000 cGy 10 sessions  . Hypertension   . Lung cancer ([place]) dx'd 2002  . Lung cancer ([place])   . Lung cancer ([place])   . Lung metastasis ([place])    PET scan 05/05/13, RUL lung nodule  . 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 Nodule of right lung CT- 06/03/12   RIGHT UPPER LOBE  . Nodule of right lung 06/03/12   Upper Lobe  . On antineoplastic chemotherapy    TARCEVA  . Osteoporosis 2010  . Primary cancer of right upper lobe of lung ([place]) 04/22/2009   Qualifier: Diagnosis of  By: Nils Pyle CMA ([place]), Mearl Latin    . Shortness of breath    hx lung ca   . Status post chemotherapy 2003   CARBOPLATIN/PACLITAXEL /STATUS POST CLINICAL TRAIL OF  CELEBREX AND IRESSA AT [facility] FOR 1 YEAR  . Status post radiation therapy 2003   LEFT LUNG  . Status post radiation therapy 11/07/2005   WHOLE BRAIN: DR Larkin [facility]  . Status post radiation therapy 06/02/2008   GAMMA KNIFE OF RESECTED CAVITAY  . Thyroid adenoma    ?  [name] Thyroid cancer ([place]) 10/18/11 bx   adenoid nodules   . Yeast infection    Past Surgical History:  Procedure Laterality Date  . BRAIN SURGERY    . LEFT LOWER LOBECTOMY  05/2001  . LEFT OCCIPITAL CRANIOTOMY  09/21/2005   tumor  . LUNG LOBECTOMY    . NECK SURGERY    . ORIF ANKLE FRACTURE Left 10/02/2014   Procedure: OPEN REDUCTION INTERNAL FIXATION (ORIF) ANKLE FRACTURE;  Surgeon: Alta Corning, MD;  Location: [facility];  Service: Orthopedics;  Laterality: Left;  . RADICAL NECK DISSECTION  10/18/2011   Procedure: RADICAL NECK DISSECTION;  Surgeon: Izora Gala, MD;   Location: [place];  Service: ENT;  Laterality: Right;  RIGHT MODIFIED NECK DISSECTION /POSSIBLE RIGHT THYROIDECTOM  . THYROIDECTOMY  10/18/2011   Procedure: THYROIDECTOMY WITH RADICAL NECK DISSECTION;  Surgeon: Izora Gala, MD;  Location: [facility] OR;  Service: ENT;  Laterality: Right;   Family History  Problem Relation Age of Onset  . Gait disorder Mother   . Cancer Mother        Meningioma  . Cancer Father        Pancreatic  . Heart failure Sister   . Cancer Maternal Aunt        menigeoma   Social History   Socioeconomic History  . Marital status: Married    Spouse name: Not on file  . Number of children: 2  . Years of education: Not on file  . Highest education level: Not on file  Occupational History  . Occupation: Pharmacist, hospital    Comment: retired after [employer]  . Financial resource strain: Not hard at all  . Food insecurity:    Worry: Never true    Inability: Never true  . Transportation needs:    Medical: No    Non-medical: No  Tobacco Use  . Smoking status: Never Smoker  . Smokeless tobacco: Never Used  . Tobacco comment: smoked few years in college  Substance and Sexual Activity  . Alcohol use: No  . Drug use: No  . Sexual activity: Yes    Birth control/protection: Post-menopausal  Lifestyle  . Physical activity:    Days per week: 7 days    Minutes per session: 10 min  . Stress: Not at all  Relationships  . Social connections:    Talks on phone: Not on file    Gets together: Not on file    Attends religious service: Not on file    Active member of club or organization: Not on file    Attends meetings of clubs or organizations: Not on file    Relationship status: Not on file  Other Topics Concern  . Not on file  Social History Narrative   ** Merged History Encounter **        Outpatient Encounter Medications as of 10/16/2018  Medication Sig  . Ascorbic Acid (VITAMIN C) 100 MG tablet Take 100 mg by mouth every evening.   .  cholecalciferol (VITAMIN D) 1000 UNITS tablet Take 1,000 Units by mouth every evening.   . donepezil (ARICEPT) 10 MG tablet Take 10 mg by mouth every evening.   [name]  loperamide (IMODIUM) 2 MG capsule Take 2 mg by mouth as needed for diarrhea or loose stools.   . Multiple Vitamin (MULTI-VITAMINS) TABS Take 1 tablet by mouth every evening.   [name] TAGRISSO 80 MG tablet TAKE 1 TABLET (80MG ) BY MOUTH DAILY. (Patient taking differently: Take 80 mg by mouth every evening. )  . vitamin B-12 (CYANOCOBALAMIN) 1000 MCG tablet Take 1,000 mcg by mouth every evening.  [name] 20 MG TABS tablet Take 20 mg by mouth every evening.    No facility-administered encounter medications on file as of 10/16/2018.     Activities of Daily Living In your present state of health, do you have any difficulty performing the following activities: 10/16/2018  Hearing? N  Vision? N  Difficulty concentrating or making decisions? N  Walking or climbing stairs? Y  Comment goes slow  Dressing or bathing? N  Doing errands, shopping? Y  Comment always has someone with her  Preparing Food and eating ? N  Using the Toilet? N  In the past six months, have you accidently leaked urine? N  Do you have problems with loss of bowel control? Y  Comment on occasion  Managing your Medications? N  Managing your Finances? N  Housekeeping or managing your Housekeeping? N  Some recent data might be hidden    Patient Care Team: [name], MD as PCP - General (Internal Medicine) [name], MD (Internal Medicine) Marylynn Pearson, MD as Consulting Physician (Ophthalmology)    Assessment:   This is a routine wellness examination for [place].  Exercise Activities and Dietary recommendations Current Exercise Habits: Home exercise routine, Type of exercise: walking;stretching, Time (Minutes): 10, Frequency (Times/Week): 7, Weekly Exercise (Minutes/Week): 70, Intensity: Mild, Exercise limited by: None identified  Goals    . Patient  Stated (pt-stated)     Wants to walk without walker       Fall Risk Fall Risk  10/16/2018 06/11/2018 01/26/2014  Falls in the past year? 1 No No  Number falls in past yr: 0 - -  Comment slid off the bed - -  Injury with Fall? 0 - -  Risk for fall due to : Impaired balance/gait;Impaired mobility;History of fall(s) - -  Follow up Education provided;Falls prevention discussed - -   Is the patient's home free of loose throw rugs in walkways, pet beds, electrical cords, etc?   yes      Grab bars in the bathroom? yes      Handrails on the stairs?   yes      Adequate lighting?   yes  Timed Get Up and Go performed: n/a  Depression Screen PHQ 2/9 Scores 10/16/2018 06/11/2018  PHQ - 2 Score 0 0  PHQ- 9 Score 6 -     Cognitive Function     6CIT Screen 10/16/2018  What Year? 0 points  What time? 3 points  Count back from 20 0 points  Months in reverse 4 points  Repeat phrase 2 points    Immunization History  Administered Date(s) Administered  . Influenza Split 06/05/2012  . Influenza,inj,Quad PF,6+ Mos 05/05/2013, 05/31/2015, 08/06/2017  . Influenza-Unspecified 05/21/2018  . Pneumococcal Conjugate-13 05/31/2015    Qualifies for Shingles Vaccine? yes  Screening Tests Health Maintenance  Topic Date Due  . Hepatitis C Screening  Jul 06, 1946  . MAMMOGRAM  08/16/2016  . COLONOSCOPY  01/31/2022  . TETANUS/TDAP  10/23/2023  . INFLUENZA VACCINE  Completed  . DEXA SCAN  Completed  . PNA vac Low Risk Adult  Completed    Cancer Screenings: Lung: Low Dose CT Chest recommended if Age 62-80 years, 30 pack-year currently smoking OR have quit w/in 15years. Patient does not qualify. Breast:  Up to date on Mammogram? No   Up to date of Bone Density/Dexa? Yes Colorectal: up to date  Additional Screenings: : Hepatitis C Screening: due     Plan:    States Gyn orders mammogram. States had cologuard last year with Dr. Collene Mares.   I have personally reviewed and noted the following in the  patient's chart:   . Medical and social history . Use of alcohol, tobacco or illicit drugs  . Current medications and supplements . Functional ability and status . Nutritional status . Physical activity . Advanced directives . List of other physicians . Hospitalizations, surgeries, and ER visits in previous 12 months . Vitals . Screenings to include cognitive, depression, and falls . Referrals and appointments  In addition, I have reviewed and discussed with patient certain preventive protocols, quality metrics, and best practice recommendations. A written personalized care plan for preventive services as well as general preventive health recommendations were provided to patient.     Kellie Simmering, LPN  [id number]

## 2018-10-17 LAB — CMP14 + ANION GAP
ALT: 10 IU/L (ref 0–32)
AST: 15 IU/L (ref 0–40)
Albumin/Globulin Ratio: 2.2 (ref 1.2–2.2)
Albumin: 5 g/dL — ABNORMAL HIGH (ref 3.7–4.7)
Alkaline Phosphatase: 73 IU/L (ref 39–117)
Anion Gap: 24 mmol/L — ABNORMAL HIGH (ref 10.0–18.0)
BUN/Creatinine Ratio: 16 (ref 12–28)
BUN: 16 mg/dL (ref 8–27)
Bilirubin Total: 0.3 mg/dL (ref 0.0–1.2)
CO2: 16 mmol/L — ABNORMAL LOW (ref 20–29)
Calcium: 10.2 mg/dL (ref 8.7–10.3)
Chloride: 108 mmol/L — ABNORMAL HIGH (ref 96–106)
Creatinine, Ser: 0.97 mg/dL (ref 0.57–1.00)
GFR calc Af Amer: 67 mL/min/{1.73_m2} (ref >59)
GFR calc non Af Amer: 58 mL/min/{1.73_m2} — ABNORMAL LOW (ref >59)
Globulin, Total: 2.3 g/dL (ref 1.5–4.5)
Glucose: 90 mg/dL (ref 65–99)
Potassium: 4.2 mmol/L (ref 3.5–5.2)
Sodium: 148 mmol/L — ABNORMAL HIGH (ref 134–144)
Total Protein: 7.3 g/dL (ref 6.0–8.5)

## 2018-10-17 LAB — LIPID PANEL
Chol/HDL Ratio: 2.9 ratio (ref 0.0–4.4)
Cholesterol, Total: 263 mg/dL — ABNORMAL HIGH (ref 100–199)
HDL: 90 mg/dL (ref >39)
LDL Calculated: 143 mg/dL — ABNORMAL HIGH (ref 0–99)
Triglycerides: 149 mg/dL (ref 0–149)
VLDL Cholesterol Cal: 30 mg/dL (ref 5–40)

## 2018-10-17 LAB — CBC
Hematocrit: 38.4 % (ref 34.0–46.6)
Hemoglobin: 12.9 g/dL (ref 11.1–15.9)
MCH: 29.7 pg (ref 26.6–33.0)
MCHC: 33.6 g/dL (ref 31.5–35.7)
MCV: 88 fL (ref 79–97)
Platelets: 227 10*3/uL (ref 150–450)
RBC: 4.35 x10E6/uL (ref 3.77–5.28)
RDW: 13.2 % (ref 11.7–15.4)
WBC: 5.2 10*3/uL (ref 3.4–10.8)

## 2018-10-17 LAB — T4, FREE: Free T4: 1.33 ng/dL (ref 0.82–1.77)

## 2018-10-17 LAB — TSH: TSH: 2.84 u[IU]/mL (ref 0.450–4.500)

## 2018-10-17 LAB — T3, FREE: T3, Free: 2.9 pg/mL (ref 2.0–4.4)

## 2018-10-18 LAB — URINE CULTURE

## 2018-10-23 LAB — SPECIMEN STATUS REPORT

## 2018-10-23 LAB — HEMOGLOBIN A1C
Est. average glucose Bld gHb Est-mCnc: 100 mg/dL
Hgb A1c MFr Bld: 5.1 % (ref 4.8–5.6)

## 2018-10-24 ENCOUNTER — Telehealth: Payer: Self-pay

## 2018-10-24 ENCOUNTER — Other Ambulatory Visit: Payer: Self-pay | Admitting: Internal Medicine

## 2018-10-24 DIAGNOSIS — C3411 Malignant neoplasm of upper lobe, right bronchus or lung: Secondary | ICD-10-CM

## 2018-10-24 NOTE — Telephone Encounter (Signed)
-----   Message from Shelby Mattocks, [place] sent at 10/23/2018  2:23 PM EST ----- Please inform pt that her urine culture was positive and the cipro she was placed on was sensitive to that particular bacteria. Also tell her her cholesterol levels, but please also look back at [facility] and tell her what they were before. Liver and kidney function are normal. Her sodium is a little high and needs to cut down on salting and processed foods and canned foods. Her DM and thyroid tests are  normal.

## 2018-10-24 NOTE — Telephone Encounter (Signed)
Attempt to give lab results

## 2018-10-27 ENCOUNTER — Ambulatory Visit: Payer: Medicare Other | Admitting: Internal Medicine

## 2018-10-27 ENCOUNTER — Other Ambulatory Visit: Payer: Medicare Other

## 2018-10-29 ENCOUNTER — Telehealth: Payer: Self-pay

## 2018-10-29 NOTE — Telephone Encounter (Signed)
Attempted to give lab results

## 2018-10-30 MED FILL — TAGRISSO 80 MG TABLET: 80 | 30 days supply | Qty: 30 | Fill #0

## 2018-11-20 ENCOUNTER — Telehealth: Payer: Self-pay | Admitting: Medical Oncology

## 2018-11-20 NOTE — Telephone Encounter (Signed)
Appt next week -office or virtual?  Labs?

## 2018-11-20 NOTE — Telephone Encounter (Signed)
She can do her lab in outside facility and I will be happy to do a telephone or Webex visit for her.

## 2018-11-21 ENCOUNTER — Telehealth: Payer: Self-pay | Admitting: Medical Oncology

## 2018-11-21 NOTE — Telephone Encounter (Signed)
LVM to get labs done at [facility] lab Monday and virtual visit on wed.

## 2018-11-25 MED FILL — TAGRISSO 80 MG TABLET: 80 | 30 days supply | Qty: 30 | Fill #1

## 2018-11-26 ENCOUNTER — Inpatient Hospital Stay: Payer: Medicare Other | Admitting: Internal Medicine

## 2018-11-26 ENCOUNTER — Inpatient Hospital Stay: Payer: Medicare Other | Attending: Internal Medicine

## 2018-12-02 ENCOUNTER — Other Ambulatory Visit: Payer: Self-pay | Admitting: Medical Oncology

## 2018-12-03 ENCOUNTER — Telehealth: Payer: Self-pay | Admitting: Internal Medicine

## 2018-12-03 NOTE — Telephone Encounter (Signed)
Scheduled appt per sch msg. Called and spoke with patient. Confirmed date and time °

## 2018-12-09 ENCOUNTER — Other Ambulatory Visit: Payer: Self-pay | Admitting: Internal Medicine

## 2018-12-09 ENCOUNTER — Telehealth: Payer: Self-pay | Admitting: Medical Oncology

## 2018-12-09 DIAGNOSIS — C3490 Malignant neoplasm of unspecified part of unspecified bronchus or lung: Secondary | ICD-10-CM

## 2018-12-09 NOTE — Telephone Encounter (Signed)
I will order CT of the neck but if she needs further evaluation of her blurry vision, she may need to see her ophthalmologist and if no concerning findings by him, I will order MRI of her brain.

## 2018-12-09 NOTE — Telephone Encounter (Addendum)
Received on call note-pt having blurred vision "and pain on her neck". Asking if she can get scan of head, neck to abdomen. I called back and no answer. She was very vague. Her  appointment  Is 5/19 with [facility].

## 2018-12-10 ENCOUNTER — Telehealth: Payer: Self-pay | Admitting: Medical Oncology

## 2018-12-10 ENCOUNTER — Telehealth: Payer: Self-pay | Admitting: Internal Medicine

## 2018-12-10 NOTE — Telephone Encounter (Signed)
Per Dr Michail Sermon will order CT of the neck but if she needs further evaluation of her blurry vision, she may need to see her ophthalmologist and if no concerning findings by him, I will order MRI of her brain." Husband notified of Dr Ellan Lambert instructions.

## 2018-12-10 NOTE — Telephone Encounter (Signed)
Scheduled appt per 4/21 sch message - left message and sent reminder letter in the mail.

## 2018-12-12 ENCOUNTER — Other Ambulatory Visit: Payer: Self-pay | Admitting: *Deleted

## 2018-12-15 ENCOUNTER — Other Ambulatory Visit: Payer: Self-pay

## 2018-12-15 DIAGNOSIS — C3491 Malignant neoplasm of unspecified part of right bronchus or lung: Secondary | ICD-10-CM

## 2018-12-15 MED ORDER — DONEPEZIL HCL 10 MG PO TABS: 10.0000 mg | ORAL_TABLET | Freq: Every evening | ORAL | 0 refills | Status: DC

## 2018-12-18 ENCOUNTER — Other Ambulatory Visit: Payer: Self-pay

## 2018-12-18 ENCOUNTER — Encounter: Payer: Self-pay | Admitting: Internal Medicine

## 2018-12-18 ENCOUNTER — Ambulatory Visit: Payer: Medicare Other | Admitting: Internal Medicine

## 2018-12-18 ENCOUNTER — Ambulatory Visit (INDEPENDENT_AMBULATORY_CARE_PROVIDER_SITE_OTHER): Payer: Medicare Other | Admitting: Internal Medicine

## 2018-12-18 DIAGNOSIS — C3491 Malignant neoplasm of unspecified part of right bronchus or lung: Secondary | ICD-10-CM

## 2018-12-18 DIAGNOSIS — C7931 Secondary malignant neoplasm of brain: Secondary | ICD-10-CM | POA: Diagnosis not present

## 2018-12-18 DIAGNOSIS — F0390 Unspecified dementia without behavioral disturbance: Secondary | ICD-10-CM | POA: Diagnosis not present

## 2018-12-18 DIAGNOSIS — Z86718 Personal history of other venous thrombosis and embolism: Secondary | ICD-10-CM

## 2018-12-18 DIAGNOSIS — R350 Frequency of micturition: Secondary | ICD-10-CM

## 2018-12-18 DIAGNOSIS — C3411 Malignant neoplasm of upper lobe, right bronchus or lung: Secondary | ICD-10-CM | POA: Diagnosis not present

## 2018-12-18 MED ORDER — DONEPEZIL HCL 10 MG PO TABS: 10.0000 mg | ORAL_TABLET | Freq: Every evening | ORAL | 2 refills | Status: DC

## 2018-12-18 MED ORDER — XARELTO 20 MG PO TABS: 20.0000 mg | ORAL_TABLET | Freq: Every evening | ORAL | 1 refills | Status: DC

## 2018-12-18 NOTE — Patient Instructions (Signed)
Urinary Frequency, Adult  Urinary frequency means urinating more often than usual. You may urinate every 1-2 hours even though you drink a normal amount of fluid and do not have a bladder infection or condition. Although you urinate more often than normal, the total amount of urine produced in a day is normal.  With urinary frequency, you may have an urgent need to urinate often. The stress and anxiety of needing to find a bathroom quickly can make this urge worse. This condition may go away on its own or you may need treatment at home. Home treatment may include bladder training, exercises, taking medicines, or making changes to your diet.  Follow these instructions at home:  Bladder health     Keep a bladder diary if told by your health care provider. Keep track of:  ? What you eat and drink.  ? How often you urinate.  ? How much you urinate.   Follow a bladder training program if told by your health care provider. This may include:  ? Learning to delay going to the bathroom.  ? Double urinating (voiding). This helps if you are not completely emptying your bladder.  ? Scheduled voiding.   Do Kegel exercises as told by your health care provider. Kegel exercises strengthen the muscles that help control urination, which may help the condition.  Eating and drinking   If told by your health care provider, make diet changes, such as:  ? Avoiding caffeine.  ? Drinking fewer fluids, especially alcohol.  ? Not drinking in the evening.  ? Avoiding foods or drinks that may irritate the bladder. These include coffee, tea, soda, artificial sweeteners, citrus, tomato-based foods, and chocolate.  ? Eating foods that help prevent or ease constipation. Constipation can make this condition worse. Your health care provider may recommend that you:   Drink enough fluid to keep your urine pale yellow.   Take over-the-counter or prescription medicines.   Eat foods that are high in fiber, such as beans, whole grains, and fresh  fruits and vegetables.   Limit foods that are high in fat and processed sugars, such as fried or sweet foods.  General instructions   Take over-the-counter and prescription medicines only as told by your health care provider.   Keep all follow-up visits as told by your health care provider. This is important.  Contact a health care provider if:   You start urinating more often.   You feel pain or irritation when you urinate.   You notice blood in your urine.   Your urine looks cloudy.   You develop a fever.   You begin vomiting.  Get help right away if:   You are unable to urinate.  Summary   Urinary frequency means urinating more often than usual. With urinary frequency, you may urinate every 1-2 hours even though you drink a normal amount of fluid and do not have a bladder infection or other bladder condition.   Your health care provider may recommend that you keep a bladder diary, follow a bladder training program, or make dietary changes.   If told by your health care provider, do Kegel exercises to strengthen the muscles that help control urination.   Take over-the-counter and prescription medicines only as told by your health care provider.   Contact a health care provider if your symptoms do not improve or get worse.  This information is not intended to replace advice given to you by your health care provider. Make sure you   discuss any questions you have with your health care provider.  Document Released: 06/02/2009 Document Revised: 02/13/2018 Document Reviewed: 02/13/2018  Elsevier Interactive Patient Education  2019 [employer]

## 2018-12-18 NOTE — Progress Notes (Signed)
Virtual Visit via Phone   This visit type was conducted due to national recommendations for restrictions regarding the COVID-19 Pandemic (e.g. social distancing) in an effort to limit this patient's exposure and mitigate transmission in our community.  Due to her co-morbid illnesses, this patient is at least at moderate risk for complications without adequate follow up.  This format is felt to be most appropriate for this patient at this time.  All issues noted in this document were discussed and addressed.  A limited physical exam was performed with this format.    This visit type was conducted due to national recommendations for restrictions regarding the COVID-19 Pandemic (e.g. social distancing) in an effort to limit this patient's exposure and mitigate transmission in our community.  Patients identity confirmed using two different identifiers.  This format is felt to be most appropriate for this patient at this time.  All issues noted in this document were discussed and addressed.  No physical exam was performed (except for noted visual exam findings with Video Visits).    Date:  12/30/2018   ID:  Valerie, [name] 1945/08/21, MRN [id number]  Patient Location:  Home, her husband is also present  Provider location:   Office   Chief Complaint:  Dementia f/u  History of Present Illness:    Valerie [name] is a 73 y.o. female who presents via phone conferencing for a telehealth visit today.    The patient does not have symptoms concerning for COVID-19 infection (fever, chills, cough, or new shortness of breath).   She presents today for virtual visit. She prefers this method of contact due to COVID-19 pandemic. She needs refill on her meds. She has been taking donepezil without any issues. Her husband has accompanied her for this visit. He does not have any concerns.     Past Medical History:  Diagnosis Date  . Abnormal Pap smear 2006  . Anal fistula   . Ankle fracture    . Anxiety   . Arthritis   . Atrophic vaginitis 2008  . Cataract   . Dementia ([place]) 2009  . Dyspareunia 2008  . H/O osteoporosis   . H/O varicella   . H/O vitamin D deficiency   . Headache 07/24/2016  . Heart murmur   . History of measles, mumps, or rubella   . History of radiation therapy 07/28/13- 08/10/13   right lung metastasis 5000 cGy 10 sessions  . Hypertension   . Lung cancer ([place]) dx'd 2002  . Lung cancer ([place])   . Lung cancer ([place])   . Lung metastasis ([place])    PET scan 05/05/13, RUL lung nodule  . 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 Nodule of right lung CT- 06/03/12   RIGHT UPPER LOBE  . Nodule of right lung 06/03/12   Upper Lobe  . On antineoplastic chemotherapy    TARCEVA  . Osteoporosis 2010  . Primary cancer of right upper lobe of lung ([place]) 04/22/2009   Qualifier: Diagnosis of  By: Nils Pyle CMA ([place]), Mearl Latin    . Shortness of breath    hx lung ca   . Status post chemotherapy 2003   CARBOPLATIN/PACLITAXEL /STATUS POST CLINICAL TRAIL OF CELEBREX AND IRESSA AT [facility] FOR 1 YEAR  . Status post radiation therapy 2003   LEFT LUNG  . Status post radiation therapy 11/07/2005   WHOLE BRAIN: DR Larkin [facility]  . Status post radiation therapy 06/02/2008  GAMMA KNIFE OF RESECTED CAVITAY  . Thyroid adenoma    ?  [name] Thyroid cancer ([place]) 10/18/11 bx   adenoid nodules   . Yeast infection    Past Surgical History:  Procedure Laterality Date  . BRAIN SURGERY    . LEFT LOWER LOBECTOMY  05/2001  . LEFT OCCIPITAL CRANIOTOMY  09/21/2005   tumor  . LUNG LOBECTOMY    . NECK SURGERY    . ORIF ANKLE FRACTURE Left 10/02/2014   Procedure: OPEN REDUCTION INTERNAL FIXATION (ORIF) ANKLE FRACTURE;  Surgeon: Alta Corning, MD;  Location: [facility];  Service: Orthopedics;  Laterality: Left;  . RADICAL NECK DISSECTION  10/18/2011   Procedure: RADICAL NECK DISSECTION;  Surgeon: Izora Gala, MD;  Location: [place];  Service: ENT;  Laterality: Right;   RIGHT MODIFIED NECK DISSECTION /POSSIBLE RIGHT THYROIDECTOM  . THYROIDECTOMY  10/18/2011   Procedure: THYROIDECTOMY WITH RADICAL NECK DISSECTION;  Surgeon: Izora Gala, MD;  Location: [place];  Service: ENT;  Laterality: Right;     Current Meds  Medication Sig  . Ascorbic Acid (VITAMIN C) 100 MG tablet Take 100 mg by mouth every evening.   . cholecalciferol (VITAMIN D) 1000 UNITS tablet Take 1,000 Units by mouth every evening.   . donepezil (ARICEPT) 10 MG tablet Take 1 tablet (10 mg total) by mouth every evening.  . loperamide (IMODIUM) 2 MG capsule Take 2 mg by mouth as needed for diarrhea or loose stools.   . Multiple Vitamin (MULTI-VITAMINS) TABS Take 1 tablet by mouth every evening.   [name] TAGRISSO 80 MG tablet TAKE 1 TABLET (80MG ) BY MOUTH DAILY.  . vitamin B-12 (CYANOCOBALAMIN) 1000 MCG tablet Take 1,000 mcg by mouth every evening.  [name] 20 MG TABS tablet Take 1 tablet (20 mg total) by mouth every evening.  . [DISCONTINUED] donepezil (ARICEPT) 10 MG tablet Take 1 tablet (10 mg total) by mouth every evening.  . [DISCONTINUED] XARELTO 20 MG TABS tablet Take 20 mg by mouth every evening.      Allergies:   Patient has no known allergies.   Social History   Tobacco Use  . Smoking status: Never Smoker  . Smokeless tobacco: Never Used  . Tobacco comment: smoked few years in college  Substance Use Topics  . Alcohol use: No  . Drug use: No     Family Hx: The patient's family history includes Cancer in her father, maternal aunt, and mother; Gait disorder in her mother; Heart failure in her sister.  ROS:   Please see the history of present illness.    Review of Systems  Constitutional: Negative.   Respiratory: Negative.   Cardiovascular: Negative.   Gastrointestinal: Negative.   Genitourinary: Positive for frequency.  Neurological: Negative.   Psychiatric/Behavioral: Negative.     All other systems reviewed and are negative.   Labs/Other Tests and Data Reviewed:     Recent Labs: 10/16/2018: TSH 2.840 12/23/2018: ALT 9; BUN 12; Creatinine 0.98; Hemoglobin 12.8; Platelet Count 210; Potassium 4.0; Sodium 142   Recent Lipid Panel Lab Results  Component Value Date/Time   CHOL 263 (H) 10/16/2018 01:00 PM   TRIG 149 10/16/2018 01:00 PM   HDL 90 10/16/2018 01:00 PM   CHOLHDL 2.9 10/16/2018 01:00 PM   LDLCALC 143 (H) 10/16/2018 01:00 PM    Wt Readings from Last 3 Encounters:  10/16/18 173 lb 9.6 oz (78.7 kg)  10/16/18 173 lb 9.6 oz (78.7 kg)  06/24/18 175 lb 3.2 oz (79.5 kg)  Exam:    Vital Signs:  There were no vitals taken for this visit.   PE not performed.    ASSESSMENT & PLAN:     1. Dementia without behavioral disturbance, unspecified dementia type (HCC)  Chronic, yet stable. She will continue with current meds. She was given a refill on donepezil. She will rto in August 2020 for her next AWV/physical examination.   - donepezil (ARICEPT) 10 MG tablet; Take 1 tablet (10 mg total) by mouth every evening.  Dispense: 90 tablet; Refill: 2  2. Urinary frequency  Her husband agrees to come to office to pick up sterile cup. He will bring clean specimen into the office tomorrow. I will check urinalysis at that time.   3. Primary cancer of right upper lobe of lung (HCC)  Chronic. Also followed by Oncology.   4. 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   5. History of DVT in adulthood She was given refill of Xarelto. She is encouraged to take medication as directed.   - XARELTO 20 MG TABS tablet; Take 1 tablet (20 mg total) by mouth every evening.  Dispense: 90 tablet; Refill: 1    COVID-19 Education: The signs and symptoms of COVID-19 were discussed with the patient and how to seek care for testing (follow up with PCP or arrange E-visit).  The importance of social distancing was discussed today.  Patient Risk:   After full review of this patients clinical status, I feel that they are at least moderate risk at this time.   Time:   Today, I have spent 11 minutes/ 30 seconds with the patient with telehealth technology discussing above diagnoses.     Medication Adjustments/Labs and Tests Ordered: Current medicines are reviewed at length with the patient today.  Concerns regarding medicines are outlined above.   Tests Ordered: No orders of the defined types were placed in this encounter.   Medication Changes: Meds ordered this encounter  Medications  . XARELTO 20 MG TABS tablet    Sig: Take 1 tablet (20 mg total) by mouth every evening.    Dispense:  90 tablet    Refill:  1  . donepezil (ARICEPT) 10 MG tablet    Sig: Take 1 tablet (10 mg total) by mouth every evening.    Dispense:  90 tablet    Refill:  2    Disposition:  Follow up in 4 month(s)  Signed, [name], MD

## 2018-12-23 ENCOUNTER — Ambulatory Visit (HOSPITAL_COMMUNITY)
Admission: RE | Admit: 2018-12-23 | Discharge: 2018-12-23 | Disposition: A | Discharge status: Home / Self Care | Payer: Medicare Other | Source: Ambulatory Visit | Attending: Internal Medicine | Admitting: Internal Medicine

## 2018-12-23 ENCOUNTER — Inpatient Hospital Stay: Payer: Medicare Other | Attending: Internal Medicine

## 2018-12-23 ENCOUNTER — Other Ambulatory Visit: Payer: Self-pay

## 2018-12-23 ENCOUNTER — Encounter (HOSPITAL_COMMUNITY): Payer: Self-pay

## 2018-12-23 DIAGNOSIS — C3411 Malignant neoplasm of upper lobe, right bronchus or lung: Secondary | ICD-10-CM | POA: Insufficient documentation

## 2018-12-23 DIAGNOSIS — C3490 Malignant neoplasm of unspecified part of unspecified bronchus or lung: Secondary | ICD-10-CM | POA: Insufficient documentation

## 2018-12-23 LAB — CMP (CANCER CENTER ONLY)
ALT: 9 U/L (ref 0–44)
AST: 12 U/L — ABNORMAL LOW (ref 15–41)
Albumin: 4.3 g/dL (ref 3.5–5.0)
Alkaline Phosphatase: 75 U/L (ref 38–126)
Anion gap: 10 (ref 5–15)
BUN: 12 mg/dL (ref 8–23)
CO2: 24 mmol/L (ref 22–32)
Calcium: 9.6 mg/dL (ref 8.9–10.3)
Chloride: 108 mmol/L (ref 98–111)
Creatinine: 0.98 mg/dL (ref 0.44–1.00)
GFR, Est AFR Am: >60 mL/min (ref >60)
GFR, Estimated: 57 mL/min — ABNORMAL LOW (ref >60)
Glucose, Bld: 97 mg/dL (ref 70–99)
Potassium: 4 mmol/L (ref 3.5–5.1)
Sodium: 142 mmol/L (ref 135–145)
Total Bilirubin: 0.6 mg/dL (ref 0.3–1.2)
Total Protein: 7.1 g/dL (ref 6.5–8.1)

## 2018-12-23 LAB — CBC WITH DIFFERENTIAL (CANCER CENTER ONLY)
Abs Immature Granulocytes: 0.01 10*3/uL (ref 0.00–0.07)
Basophils Absolute: 0 10*3/uL (ref 0.0–0.1)
Basophils Relative: 1 %
Eosinophils Absolute: 0.1 10*3/uL (ref 0.0–0.5)
Eosinophils Relative: 1 %
HCT: 41.1 % (ref 36.0–46.0)
Hemoglobin: 12.8 g/dL (ref 12.0–15.0)
Immature Granulocytes: 0 %
Lymphocytes Relative: 16 %
Lymphs Abs: 0.7 10*3/uL (ref 0.7–4.0)
MCH: 29.3 pg (ref 26.0–34.0)
MCHC: 31.1 g/dL (ref 30.0–36.0)
MCV: 94.1 fL (ref 80.0–100.0)
Monocytes Absolute: 0.4 10*3/uL (ref 0.1–1.0)
Monocytes Relative: 8 %
Neutro Abs: 3.2 10*3/uL (ref 1.7–7.7)
Neutrophils Relative %: 74 %
Platelet Count: 210 10*3/uL (ref 150–400)
RBC: 4.37 MIL/uL (ref 3.87–5.11)
RDW: 13.6 % (ref 11.5–15.5)
WBC Count: 4.4 10*3/uL (ref 4.0–10.5)
nRBC: 0 % (ref 0.0–0.2)

## 2018-12-23 MED ORDER — IOHEXOL 300 MG/ML  SOLN
100.0000 mL | Freq: Once | INTRAMUSCULAR | Status: AC | PRN
  Administered 2018-12-23: 15:00:00 100 mL via INTRAVENOUS

## 2018-12-23 MED ORDER — IOHEXOL 300 MG/ML  SOLN
30.0000 mL | Freq: Once | INTRAMUSCULAR | Status: AC | PRN
  Administered 2018-12-23: 30 mL via ORAL

## 2018-12-23 MED ORDER — SODIUM CHLORIDE (PF) 0.9 % IJ SOLN
INTRAMUSCULAR | Status: AC
  Filled 2018-12-23: qty 50

## 2018-12-25 MED FILL — TAGRISSO 80 MG TABLET: 80 | 30 days supply | Qty: 30 | Fill #2

## 2018-12-26 ENCOUNTER — Telehealth: Payer: Self-pay | Admitting: Physician Assistant

## 2018-12-26 NOTE — Telephone Encounter (Signed)
Changed 5/19 to phone visit per sch  Msg. Called patients husband, confirmed change to appt

## 2019-01-01 ENCOUNTER — Other Ambulatory Visit (INDEPENDENT_AMBULATORY_CARE_PROVIDER_SITE_OTHER): Payer: Medicare Other

## 2019-01-01 ENCOUNTER — Other Ambulatory Visit: Payer: Self-pay | Admitting: Internal Medicine

## 2019-01-01 ENCOUNTER — Telehealth: Payer: Self-pay

## 2019-01-01 DIAGNOSIS — N390 Urinary tract infection, site not specified: Secondary | ICD-10-CM

## 2019-01-01 LAB — POCT URINALYSIS DIPSTICK
Bilirubin, UA: NEGATIVE
Glucose, UA: NEGATIVE
Ketones, UA: NEGATIVE
Nitrite, UA: POSITIVE
Protein, UA: NEGATIVE
Spec Grav, UA: >=1.03 — AB (ref 1.010–1.025)
Urobilinogen, UA: NEGATIVE E.U./dL — AB
pH, UA: 5 (ref 5.0–8.0)

## 2019-01-01 MED ORDER — CEPHALEXIN 500 MG PO CAPS: 500.0000 mg | ORAL_CAPSULE | Freq: Three times a day (TID) | ORAL | 0 refills | Status: AC

## 2019-01-01 NOTE — Telephone Encounter (Signed)
The pt was notified that Dr. Baird Cancer sent a prescription to the pharmacy for the pt's UTI.

## 2019-01-03 LAB — URINE CULTURE

## 2019-01-06 ENCOUNTER — Inpatient Hospital Stay (HOSPITAL_BASED_OUTPATIENT_CLINIC_OR_DEPARTMENT_OTHER): Payer: Medicare Other | Admitting: Physician Assistant

## 2019-01-06 ENCOUNTER — Other Ambulatory Visit: Payer: Medicare Other

## 2019-01-06 ENCOUNTER — Encounter: Payer: Self-pay | Admitting: Physician Assistant

## 2019-01-06 DIAGNOSIS — H538 Other visual disturbances: Secondary | ICD-10-CM | POA: Diagnosis not present

## 2019-01-06 DIAGNOSIS — Z79899 Other long term (current) drug therapy: Secondary | ICD-10-CM | POA: Diagnosis not present

## 2019-01-06 DIAGNOSIS — Z5111 Encounter for antineoplastic chemotherapy: Secondary | ICD-10-CM | POA: Diagnosis not present

## 2019-01-06 DIAGNOSIS — C3411 Malignant neoplasm of upper lobe, right bronchus or lung: Secondary | ICD-10-CM

## 2019-01-06 NOTE — Progress Notes (Signed)
[facility] HEMATOLOGY-ONCOLOGY TeleHEALTH VISIT PROGRESS NOTE   Dr. Julien Nordmann and I connected with Valerie [name] on 01/06/19 at  2:30 PM EDT by telephone and verified that I am speaking with the correct person using two identifiers.  I discussed the limitations, risks, security and privacy concerns of performing an evaluation and management service by telemedicine and the availability of in-person appointments. I also discussed with the patient that there may be a patient responsible charge related to this service. The patient expressed understanding and agreed to proceed.  Other persons participating in the visit and their role in the encounter: The patient's husband Patient's location: Home Provider's location: [facility], [place], [place] [address]  DIAGNOSIS: metastatic non-small cell lung cancer, adenocarcinoma with brain metastases initially diagnosed as a stage IIb in October 2002.   BIOMARKERS TESTING ([facility]): Positive for: EGFR [id number] del, T790M, CTNNB1 D32G, SMAD4 T173f*10 Negative for: RET, ALK, BRAF, KRAS, ERBB2 and MET  PRIOR THERAPY:  1. Status post left lower lobectomy under the care of Dr BArlyce Dicein October 2002. 2. Status post course of concurrent chemoradiation with weekly carboplatin and paclitaxel under the care of Dr SBenay Spiceand Dr WElba Barmanin early 2003. 3. Status post [facility] 4. Status post left occipital craniotomy for tumor resection on September 21, 2005, under the care of Dr CChristella Noa 5. Status post whole brain irradiation under the care of Dr WElba Barman completed November 07, 2005. 6. Status post gamma knife [facility] the [facility] 7. Status post right thyroidectomy with radical neck dissection under the care of Dr. RConstance Holsteron  10/18/2011. 8. Status post excisional biopsy of right cervical lymph node that was consistent with metastatic adenocarcinoma. 9. Tarceva 150 mg by mouth daily with therapy beginning 06/03/2007, discontinued in July 2015 secondary to disease progression. Status post 79 cycles. 10. palliative radiotherapy to the enlarging right upper lobe lung nodule under the care of Dr. MValere Dross [facility]. [id number] mg by mouth daily for 21 days discontinued secondary to study deviation and [facility]. 12. Open reduction and internal fixation of trimalleolar ankle fracture dislocation with fixation of the fibula as well as medial malleolus.  CURRENT THERAPY: Tagrisso 80 mg by mouth daily started 11/25/2014, status post 49 months of treatment.  INTERVAL HISTORY: Valerie SPADACCINI73y.o. female connected with Dr. MJulien Nordmannand I via a telephone visit today. The patient is feeling fairly well today without any concerning complaints except for some visual blurring. The patient has a history of cataracts and is unsure if this is similar. She has been unable to see her ophthalmologist due to the COVID-19 pandemic. She also notes an occasional left temporal headache. The pain is described as "sharp", lasting a few minutes before resolving on its own. The patient does not take any medication to alleviate her symptoms. She states the pain is aggravated by moving her neck to look over her shoulder. She denies any other associated symptoms such as extremity weakness, seizures, or other visual changes. She states she has had two falls in the last few months. One of which she slipped after mopping the floor and the other losing her balance while  transferring out of a chair.   Otherwise, she is feeling fairly well today. She recently saw her PCP for a UTI. Per chart review, it appears she was sent  a prescription for antibiotics. She denies any recent fevers, chills, night sweats, or unexpected weight loss. She denies any chest pain, shortness of breath, cough, or hemoptysis. She denies any nausea, vomiting, diarrhea, or constipation. She recently had a restaging CT scan. Her telephone visit today is for evaluation and to discuss her scan results.   MEDICAL HISTORY: Past Medical History:  Diagnosis Date  . Abnormal Pap smear 2006  . Anal fistula   . Ankle fracture   . Anxiety   . Arthritis   . Atrophic vaginitis 2008  . Cataract   . Dementia ([place]) 2009  . Dyspareunia 2008  . H/O osteoporosis   . H/O varicella   . H/O vitamin D deficiency   . Headache 07/24/2016  . Heart murmur   . History of measles, mumps, or rubella   . History of radiation therapy 07/28/13- 08/10/13   right lung metastasis 5000 cGy 10 sessions  . Hypertension   . Lung cancer ([place]) dx'd 2002  . Lung cancer ([place])   . Lung cancer ([place])   . Lung metastasis ([place])    PET scan 05/05/13, RUL lung nodule  . Metastasis to brain [facility]) dx'd 2008  . 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 Nodule of right lung CT- 06/03/12   RIGHT UPPER LOBE  . Nodule of right lung 06/03/12   Upper Lobe  . On antineoplastic chemotherapy    TARCEVA  . Osteoporosis 2010  . Primary cancer of right upper lobe of lung ([place]) 04/22/2009   Qualifier: Diagnosis of  By: Nils Pyle CMA ([place]), Mearl Latin    . Shortness of breath    hx lung ca   . Status post chemotherapy 2003   CARBOPLATIN/PACLITAXEL /STATUS POST CLINICAL TRAIL OF CELEBREX AND IRESSA AT [facility] FOR 1 YEAR  . Status post radiation therapy 2003   LEFT LUNG  . Status post radiation therapy 11/07/2005   WHOLE BRAIN: DR Larkin [facility]  . Status post radiation therapy 06/02/2008   GAMMA KNIFE OF RESECTED CAVITAY  . Thyroid adenoma    ?  [name] Thyroid cancer ([place]) 10/18/11 bx   adenoid nodules   . Yeast infection     ALLERGIES:  has No Known Allergies.  MEDICATIONS:  Current  Outpatient Medications  Medication Sig Dispense Refill  . Ascorbic Acid (VITAMIN C) 100 MG tablet Take 100 mg by mouth every evening.     . cephALEXin (KEFLEX) 500 MG capsule Take 1 capsule (500 mg total) by mouth 3 (three) times daily for 7 days. 21 capsule 0  . cholecalciferol (VITAMIN D) 1000 UNITS tablet Take 1,000 Units by mouth every evening.     . donepezil (ARICEPT) 10 MG tablet Take 1 tablet (10 mg total) by mouth every evening. 90 tablet 2  . loperamide (IMODIUM) 2 MG capsule Take 2 mg by mouth as needed for diarrhea or loose stools.     . Multiple Vitamin (MULTI-VITAMINS) TABS Take 1 tablet by mouth every evening.     [name] TAGRISSO 80 MG tablet TAKE 1 TABLET ('80MG'$ ) BY MOUTH DAILY. 30 tablet 2  . vitamin B-12 (CYANOCOBALAMIN) 1000 MCG tablet Take 1,000 mcg by mouth every evening.    [name] 20 MG TABS tablet Take 1 tablet (20 mg total) by mouth every evening. 90 tablet 1  No current facility-administered medications for this visit.     SURGICAL HISTORY:  Past Surgical History:  Procedure Laterality Date  . BRAIN SURGERY    . LEFT LOWER LOBECTOMY  05/2001  . LEFT OCCIPITAL CRANIOTOMY  09/21/2005   tumor  . LUNG LOBECTOMY    . NECK SURGERY    . ORIF ANKLE FRACTURE Left 10/02/2014   Procedure: OPEN REDUCTION INTERNAL FIXATION (ORIF) ANKLE FRACTURE;  Surgeon: Alta Corning, MD;  Location: [facility];  Service: Orthopedics;  Laterality: Left;  . RADICAL NECK DISSECTION  10/18/2011   Procedure: RADICAL NECK DISSECTION;  Surgeon: Izora Gala, MD;  Location: [facility];  Service: ENT;  Laterality: Right;  RIGHT MODIFIED NECK DISSECTION /POSSIBLE RIGHT THYROIDECTOM  . THYROIDECTOMY  10/18/2011   Procedure: THYROIDECTOMY WITH RADICAL NECK DISSECTION;  Surgeon: Izora Gala, MD;  Location: [facility];  Service: ENT;  Laterality: Right;    REVIEW OF SYSTEMS:   Review of Systems  Constitutional: Positive for fatigue. Negative for appetite change, chills, fever and unexpected weight change.  HENT:    Negative for mouth sores, nosebleeds, sore throat and trouble swallowing.   Eyes: Positive for visual blurring. Negative for icterus, double vision, photophobia, or pain with EOM.  Respiratory: Negative for cough, hemoptysis, shortness of breath and wheezing.   Cardiovascular: Negative for chest pain and leg swelling.  Gastrointestinal: Negative for abdominal pain, constipation, diarrhea, nausea and vomiting.  Genitourinary: Negative for bladder incontinence, difficulty urinating, dysuria, frequency and hematuria.   Musculoskeletal: Negative for back pain, neck pain and neck stiffness.  Skin: Negative for itching and rash.  Neurological: Positive for occasional headache. Negative for dizziness, extremity weakness, gait problem, light-headedness and seizures.  Hematological: Negative for adenopathy. Does not bruise/bleed easily.  Psychiatric/Behavioral: Negative for confusion, depression and sleep disturbance. The patient is not nervous/anxious.     PHYSICAL EXAMINATION:  There were no vitals taken for this visit.  LABORATORY DATA: Lab Results  Component Value Date   WBC 4.4 12/23/2018   HGB 12.8 12/23/2018   HCT 41.1 12/23/2018   MCV 94.1 12/23/2018   PLT 210 12/23/2018      Chemistry      Component Value Date/Time   NA 142 12/23/2018 1143   NA 148 (H) 10/16/2018 1300   NA 141 08/06/2017 1047   K 4.0 12/23/2018 1143   K 3.9 08/06/2017 1047   CL 108 12/23/2018 1143   CL 106 12/09/2012 0806   CO2 24 12/23/2018 1143   CO2 24 08/06/2017 1047   BUN 12 12/23/2018 1143   BUN 16 10/16/2018 1300   BUN 15.4 08/06/2017 1047   CREATININE 0.98 12/23/2018 1143   CREATININE 1.0 08/06/2017 1047      Component Value Date/Time   CALCIUM 9.6 12/23/2018 1143   CALCIUM 9.8 08/06/2017 1047   ALKPHOS 75 12/23/2018 1143   ALKPHOS 65 08/06/2017 1047   AST 12 (L) 12/23/2018 1143   AST 11 08/06/2017 1047   ALT 9 12/23/2018 1143   ALT 9 08/06/2017 1047   BILITOT 0.6 12/23/2018 1143    BILITOT 0.66 08/06/2017 1047       RADIOGRAPHIC STUDIES:  Ct Soft Tissue Neck W Contrast  Result Date: 12/23/2018 CLINICAL DATA:  Metastatic lung cancer, neck pain, fatigue, and diarrhea. EXAM: CT NECK WITH CONTRAST TECHNIQUE: Multidetector CT imaging of the neck was performed using the standard protocol following the bolus administration of intravenous contrast. CONTRAST:  154m OMNIPAQUE IOHEXOL 300 MG/ML  SOLN COMPARISON:  CT neck 10/04/2017.  FINDINGS: Pharynx and larynx: Normal. No mass or swelling. Salivary glands: No inflammation, mass, or stone. Thyroid: RIGHT hemithyroidectomy. Lymph nodes: None enlarged or abnormal density. Vascular: Negative. Limited intracranial: Negative. Visualized orbits: Negative. Mastoids and visualized paranasal sinuses: No fluid. Skeleton: Scoliotic deformity. No aggressive lesions. Upper chest: Reported separately. Other: None. IMPRESSION: No evidence of metastatic disease in the neck. Similar favorable appearance when compared with previous exam from 2019. Electronically Signed   By: Staci Righter M.D.   On: 12/23/2018 16:44   Ct Chest W Contrast  Result Date: 12/23/2018 CLINICAL DATA:  Lung cancer staging EXAM: CT CHEST, ABDOMEN, AND PELVIS WITH CONTRAST TECHNIQUE: Multidetector CT imaging of the chest, abdomen and pelvis was performed following the standard protocol during bolus administration of intravenous contrast. CONTRAST:  142m OMNIPAQUE IOHEXOL 300 MG/ML SOLN additional oral enteric contrast COMPARISON:  04/22/2018, 10/04/2017, 05/25/2015 FINDINGS: CT CHEST FINDINGS Cardiovascular: Scattered coronary artery calcifications. Normal heart size. No pericardial effusion. Mediastinum/Nodes: No enlarged mediastinal, hilar, or axillary lymph nodes. Status post right lobe thyroidectomy. Trachea, and esophagus demonstrate no significant findings. Lungs/Pleura: Redemonstrated postoperative findings of left lower lobectomy with small left pleural effusion and post  treatment scarring of the left lower lobe. Unchanged spiculated soft tissue of the suprahilar right upper lobe (series 4, image 52). Stable small pulmonary nodules bilaterally, for example a 4 mm nodule of the right upper lobe (series 4, image 53). Musculoskeletal: No chest wall mass or suspicious bone lesions identified. CT ABDOMEN PELVIS FINDINGS Hepatobiliary: No focal liver abnormality is seen. No gallstones, gallbladder wall thickening, or biliary dilatation. Pancreas: Unremarkable. No pancreatic ductal dilatation or surrounding inflammatory changes. Spleen: Normal in size without focal abnormality. Adrenals/Urinary Tract: Adrenal glands are unremarkable. Kidneys are normal, without renal calculi, focal lesion, or hydronephrosis. Bladder is unremarkable. Stomach/Bowel: Stomach is within normal limits. Appendix appears normal. No evidence of bowel wall thickening, distention, or inflammatory changes. Occasional sigmoid diverticula. Vascular/Lymphatic: No significant vascular findings are present. No enlarged abdominal or pelvic lymph nodes. Reproductive: Uterine fibroids. Probable fluid in the endometrial cavity, abnormal in the postmenopausal setting although unchanged in comparison to multiple prior examinations. Other: No abdominal wall hernia or abnormality. No abdominopelvic ascites. Musculoskeletal: Unchanged vertebral body height loss deformities of L1 and L4. Osteopenia. IMPRESSION: 1. Unchanged post treatment appearance of the chest. Redemonstrated postoperative findings of left lower lobectomy with small left pleural effusion and post treatment scarring of the left lower lobe. Unchanged spiculated soft tissue of the suprahilar right upper lobe (series 4, image 52). 2. Stable small pulmonary nodules bilaterally, likely benign sequelae of infection or inflammation. Attention on follow-up. 3.  No evidence of metastatic disease in the abdomen or pelvis. 4.  Chronic and incidental findings as detailed  above. Electronically Signed   By: AEddie CandleM.D.   On: 12/23/2018 16:58   Ct Abdomen Pelvis W Contrast  Result Date: 12/23/2018 CLINICAL DATA:  Lung cancer staging EXAM: CT CHEST, ABDOMEN, AND PELVIS WITH CONTRAST TECHNIQUE: Multidetector CT imaging of the chest, abdomen and pelvis was performed following the standard protocol during bolus administration of intravenous contrast. CONTRAST:  1042mOMNIPAQUE IOHEXOL 300 MG/ML SOLN additional oral enteric contrast COMPARISON:  04/22/2018, 10/04/2017, 05/25/2015 FINDINGS: CT CHEST FINDINGS Cardiovascular: Scattered coronary artery calcifications. Normal heart size. No pericardial effusion. Mediastinum/Nodes: No enlarged mediastinal, hilar, or axillary lymph nodes. Status post right lobe thyroidectomy. Trachea, and esophagus demonstrate no significant findings. Lungs/Pleura: Redemonstrated postoperative findings of left lower lobectomy with small left pleural effusion and post treatment scarring  of the left lower lobe. Unchanged spiculated soft tissue of the suprahilar right upper lobe (series 4, image 52). Stable small pulmonary nodules bilaterally, for example a 4 mm nodule of the right upper lobe (series 4, image 53). Musculoskeletal: No chest wall mass or suspicious bone lesions identified. CT ABDOMEN PELVIS FINDINGS Hepatobiliary: No focal liver abnormality is seen. No gallstones, gallbladder wall thickening, or biliary dilatation. Pancreas: Unremarkable. No pancreatic ductal dilatation or surrounding inflammatory changes. Spleen: Normal in size without focal abnormality. Adrenals/Urinary Tract: Adrenal glands are unremarkable. Kidneys are normal, without renal calculi, focal lesion, or hydronephrosis. Bladder is unremarkable. Stomach/Bowel: Stomach is within normal limits. Appendix appears normal. No evidence of bowel wall thickening, distention, or inflammatory changes. Occasional sigmoid diverticula. Vascular/Lymphatic: No significant vascular findings are  present. No enlarged abdominal or pelvic lymph nodes. Reproductive: Uterine fibroids. Probable fluid in the endometrial cavity, abnormal in the postmenopausal setting although unchanged in comparison to multiple prior examinations. Other: No abdominal wall hernia or abnormality. No abdominopelvic ascites. Musculoskeletal: Unchanged vertebral body height loss deformities of L1 and L4. Osteopenia. IMPRESSION: 1. Unchanged post treatment appearance of the chest. Redemonstrated postoperative findings of left lower lobectomy with small left pleural effusion and post treatment scarring of the left lower lobe. Unchanged spiculated soft tissue of the suprahilar right upper lobe (series 4, image 52). 2. Stable small pulmonary nodules bilaterally, likely benign sequelae of infection or inflammation. Attention on follow-up. 3.  No evidence of metastatic disease in the abdomen or pelvis. 4.  Chronic and incidental findings as detailed above. Electronically Signed   By: Eddie Candle M.D.   On: 12/23/2018 16:58     ASSESSMENT/PLAN:  This is a very pleasant 73 year old African-American female with metastatic non-small cell lung cancer, adenocarcinoma.  She was initially diagnosed in 2002.  She has a positive EGFR mutation.  She is status post several years of treatment with Tarceva.  The patient developed a resistant mutation in T790M.   The patient is currently on treatment with Tagrisso.  She is status post 49 months of treatment.  She has been tolerating her treatment well without any adverse effects.  The patiently recently had a restaging CT scan performed. Dr. Julien Nordmann personally and independently reviewed the scan and discussed the results with the patient and her husband today.  The scan showed no evidence of disease progression.  Dr. Julien Nordmann recommends that she continue on her current treatment with Tagrisso with the same dose.  We will see the patient back for follow-up visit in 2 months for evaluation and  routine lab work.  For the headache and visual changes, we will arrange for a brain MRI to be performed to assess for any evidence of new metastatic disease responsible for the headaches and visual changes.   The patient was advised to call immediately if she has any concerning symptoms in the interval. The patient voices understanding of current disease status and treatment options and is in agreement with the current care plan. All questions were answered. The patient knows to call the clinic with any problems, questions or concerns. We can certainly see the patient much sooner if necessary  I provided 15 minutes of non face-to-face telephone visit time during this encounter, and > 50% was spent counseling as documented under my assessment & plan.  Cassandra L Heilingoetter, PA-C 01/06/2019 4:15 PM  Orders Placed This Encounter  Procedures  . MR Brain W Wo Contrast    Standing Status:   Future    Standing  Expiration Date:   01/06/2020    Order Specific Question:   ** REASON FOR EXAM (FREE TEXT)    Answer:   Lung Cancer with new onset visual changes    Order Specific Question:   If indicated for the ordered procedure, I authorize the administration of contrast media per Radiology protocol    Answer:   Yes    Order Specific Question:   What is the patient's sedation requirement?    Answer:   No Sedation    Order Specific Question:   Does the patient have a pacemaker or implanted devices?    Answer:   No    Order Specific Question:   Use SRS Protocol?    Answer:   No    Order Specific Question:   Radiology Contrast Protocol - do NOT remove file path    Answer:   [email or web address]    Order Specific Question:   Preferred imaging location?    Answer:   [facility] (table limit-350 lbs)  . CMP ([place] only)    Standing Status:   Future    Standing Expiration Date:   01/06/2020  . CBC with Differential (Cancer Center Only)    Standing Status:    Future    Standing Expiration Date:   01/06/2020     Tobe Sos Heilingoetter, PA-C 01/06/19  ADDENDUM: Hematology/Oncology Attending: I contributed to the telephone virtual visit today.  I recommended the care plan for this patient.  She is a very pleasant 73 years old African-American female with metastatic non-small cell lung cancer, adenocarcinoma with positive EGFR mutation diagnosed more than 10 years ago and she has been on several targeted therapy and currently on Tagrisso 80 mg p.o. daily.  The patient has been tolerating this treatment well with no concerning adverse effects.  She is complaining of some visual disturbance recently. She had repeat CT scan of the neck, chest, abdomen pelvis performed recently.  I personally and independently reviewed the scans and discussed the results with the patient and her husband today. HIDA scan showed no concerning findings for disease progression. I recommended for the patient to continue her current treatment with Tagrisso with the same dose. For the visual disturbance, will arrange for the patient to have MRI of the brain to rule out any brain metastasis. We will see the patient back for follow-up visit in 2 months for reevaluation and repeat blood work. She was advised to call immediately if she has any concerning symptoms in the interval.  Disclaimer: This note was dictated with voice recognition software. Similar sounding words can inadvertently be transcribed and may be missed upon review. Eilleen Kempf, MD 01/06/19

## 2019-01-07 ENCOUNTER — Telehealth: Payer: Self-pay | Admitting: Physician Assistant

## 2019-01-07 NOTE — Telephone Encounter (Signed)
Scheduled appt per 5/19 los - no answer - mailed letter with appt date and time

## 2019-01-13 ENCOUNTER — Other Ambulatory Visit: Payer: Self-pay

## 2019-01-13 DIAGNOSIS — F0390 Unspecified dementia without behavioral disturbance: Secondary | ICD-10-CM

## 2019-01-13 MED ORDER — DONEPEZIL HCL 10 MG PO TABS: 10.0000 mg | ORAL_TABLET | Freq: Every evening | ORAL | 2 refills | Status: DC

## 2019-01-15 ENCOUNTER — Other Ambulatory Visit: Payer: Self-pay | Admitting: Internal Medicine

## 2019-01-15 DIAGNOSIS — C3411 Malignant neoplasm of upper lobe, right bronchus or lung: Secondary | ICD-10-CM

## 2019-01-22 MED FILL — TAGRISSO 80 MG TABLET: 80 | 30 days supply | Qty: 30 | Fill #0

## 2019-01-27 ENCOUNTER — Ambulatory Visit (HOSPITAL_COMMUNITY)
Admission: RE | Admit: 2019-01-27 | Discharge: 2019-01-27 | Disposition: A | Discharge status: Home / Self Care | Payer: Medicare Other | Source: Ambulatory Visit | Attending: Physician Assistant | Admitting: Physician Assistant

## 2019-01-27 ENCOUNTER — Other Ambulatory Visit: Payer: Self-pay

## 2019-01-27 DIAGNOSIS — C3411 Malignant neoplasm of upper lobe, right bronchus or lung: Secondary | ICD-10-CM | POA: Insufficient documentation

## 2019-01-27 MED ORDER — GADOBUTROL 1 MMOL/ML IV SOLN
8.0000 mL | Freq: Once | INTRAVENOUS | Status: AC | PRN
  Administered 2019-01-27: 7 mL via INTRAVENOUS

## 2019-02-17 MED FILL — TAGRISSO 80 MG TABLET: 80 | 30 days supply | Qty: 30 | Fill #1

## 2019-03-10 ENCOUNTER — Encounter: Payer: Self-pay | Admitting: Internal Medicine

## 2019-03-10 ENCOUNTER — Telehealth: Payer: Self-pay | Admitting: Internal Medicine

## 2019-03-10 ENCOUNTER — Inpatient Hospital Stay: Payer: Medicare Other | Attending: Internal Medicine | Admitting: Internal Medicine

## 2019-03-10 ENCOUNTER — Other Ambulatory Visit: Payer: Self-pay

## 2019-03-10 ENCOUNTER — Inpatient Hospital Stay: Payer: Medicare Other

## 2019-03-10 VITALS — BP 128/76 | HR 98 | Temp 99.1°F | Resp 18 | Ht 67.0 in | Wt 170.3 lb

## 2019-03-10 DIAGNOSIS — C7931 Secondary malignant neoplasm of brain: Secondary | ICD-10-CM | POA: Diagnosis not present

## 2019-03-10 DIAGNOSIS — C3411 Malignant neoplasm of upper lobe, right bronchus or lung: Secondary | ICD-10-CM | POA: Insufficient documentation

## 2019-03-10 DIAGNOSIS — R197 Diarrhea, unspecified: Secondary | ICD-10-CM

## 2019-03-10 DIAGNOSIS — Z7901 Long term (current) use of anticoagulants: Secondary | ICD-10-CM | POA: Diagnosis not present

## 2019-03-10 DIAGNOSIS — I10 Essential (primary) hypertension: Secondary | ICD-10-CM | POA: Diagnosis not present

## 2019-03-10 DIAGNOSIS — Z5111 Encounter for antineoplastic chemotherapy: Secondary | ICD-10-CM

## 2019-03-10 LAB — CBC WITH DIFFERENTIAL (CANCER CENTER ONLY)
Abs Immature Granulocytes: 0.01 10*3/uL (ref 0.00–0.07)
Basophils Absolute: 0 10*3/uL (ref 0.0–0.1)
Basophils Relative: 0 %
Eosinophils Absolute: 0.1 10*3/uL (ref 0.0–0.5)
Eosinophils Relative: 1 %
HCT: 41.8 % (ref 36.0–46.0)
Hemoglobin: 13.1 g/dL (ref 12.0–15.0)
Immature Granulocytes: 0 %
Lymphocytes Relative: 15 %
Lymphs Abs: 0.8 10*3/uL (ref 0.7–4.0)
MCH: 29.5 pg (ref 26.0–34.0)
MCHC: 31.3 g/dL (ref 30.0–36.0)
MCV: 94.1 fL (ref 80.0–100.0)
Monocytes Absolute: 0.5 10*3/uL (ref 0.1–1.0)
Monocytes Relative: 9 %
Neutro Abs: 3.9 10*3/uL (ref 1.7–7.7)
Neutrophils Relative %: 75 %
Platelet Count: 214 10*3/uL (ref 150–400)
RBC: 4.44 MIL/uL (ref 3.87–5.11)
RDW: 13.6 % (ref 11.5–15.5)
WBC Count: 5.2 10*3/uL (ref 4.0–10.5)
nRBC: 0 % (ref 0.0–0.2)

## 2019-03-10 LAB — CMP (CANCER CENTER ONLY)
ALT: 8 U/L (ref 0–44)
AST: 12 U/L — ABNORMAL LOW (ref 15–41)
Albumin: 4.3 g/dL (ref 3.5–5.0)
Alkaline Phosphatase: 70 U/L (ref 38–126)
Anion gap: 13 (ref 5–15)
BUN: 14 mg/dL (ref 8–23)
CO2: 23 mmol/L (ref 22–32)
Calcium: 9.9 mg/dL (ref 8.9–10.3)
Chloride: 106 mmol/L (ref 98–111)
Creatinine: 1.06 mg/dL — ABNORMAL HIGH (ref 0.44–1.00)
GFR, Est AFR Am: >60 mL/min (ref >60)
GFR, Estimated: 52 mL/min — ABNORMAL LOW (ref >60)
Glucose, Bld: 100 mg/dL — ABNORMAL HIGH (ref 70–99)
Potassium: 3.7 mmol/L (ref 3.5–5.1)
Sodium: 142 mmol/L (ref 135–145)
Total Bilirubin: 0.7 mg/dL (ref 0.3–1.2)
Total Protein: 7.3 g/dL (ref 6.5–8.1)

## 2019-03-10 NOTE — Telephone Encounter (Signed)
Scheduled appt per 7/21 LOS - pt aware of appt date and time

## 2019-03-10 NOTE — Progress Notes (Signed)
[place] [phone]   [phone]  OFFICE PROGRESS NOTE  [name], [address] [place] [address]  PRINCIPAL DIAGNOSIS:  metastatic non-small cell lung cancer, adenocarcinoma with brain metastases initially diagnosed as a stage IIb in October 2002.   BIOMARKERS TESTING ([facility]): Positive for: EGFR [id number] del, T790M, CTNNB1 D32G, SMAD4 T120f*10 Negative for: RET, ALK, BRAF, KRAS, ERBB2 and MET  PRIOR THERAPY:  1. Status post left lower lobectomy under the care of Dr BArlyce Dicein October 2002. 2. Status post course of concurrent chemoradiation with weekly carboplatin and paclitaxel under the care of Dr SBenay Spiceand Dr WElba Barmanin early 2003. 3. Status post treatment [facility]bre[facility] 4. Status post left occipital craniotomy for tumor resection on September 21, 2005, under the care of Dr CChristella Noa 5. Status post whole brain irradiation under the care of Dr WElba Barman completed November 07, 2005. 6. Status post gamma knife [facility] the [facility]. Status post right thyroidectomy with radical neck dissection under the care of Dr. RConstance Holsteron 10/18/2011. 8. Status post excisional biopsy of right cervical lymph node that was consistent with metastatic adenocarcinoma. 9. Tarceva 150 mg by mouth daily with therapy beginning 06/03/2007, discontinued in July 2015 secondary to disease progression. Status post 79 cycles. 10. palliative radiotherapy to the enlarging right upper lobe lung nodule under the care of Dr. MValere Dross [facility]. [id number] 300 mg by mouth daily for 21 days discontinued secondary to study deviation and [facility]. 12. Open reduction and  internal fixation of trimalleolar ankle fracture dislocation with fixation of the fibula as well as medial malleolus.   CURRENT THERAPY: Tagrisso 80 mg by mouth daily started 11/25/2014, status post 44 months of treatment.  INTERVAL HISTORY: Valerie NESSEL89y.o. female returns to the clinic today for follow-up visit.  The patient is feeling fine today with no concerning complaints except for the generalized fatigue and few episodes of diarrhea.  She takes Imodium twice daily.  She denied having any skin rash.  She denied having any chest pain, shortness of breath, cough or hemoptysis.  She has no nausea, vomiting, or constipation.  The patient denied having any headache or visual changes.  She had MRI of the brain performed last month that was unremarkable for any disease progression in the brain.  She is here today for evaluation and repeat blood work.   MEDICAL HISTORY: Past Medical History:  Diagnosis Date  . Abnormal Pap smear 2006  . Anal fistula   . Ankle fracture   . Anxiety   . Arthritis   . Atrophic vaginitis 2008  . Cataract   . Dementia ([facility] 2009  . Dyspareunia 2008  . H/O osteoporosis   . H/O varicella   . H/O vitamin D deficiency   . Headache 07/24/2016  . Heart murmur   . History of measles, mumps, or rubella   . History of radiation therapy 07/28/13- 08/10/13   right lung metastasis 5000 cGy 10 sessions  . Hypertension   . [place] [place]  . [facility] cancer ([facility]   . [place] cancer ([facility]   . [place] [place]  PET scan 05/05/13, RUL lung nodule  . Metastasis to brain [facility]) dx'd 2008  . 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 Nodule of right lung CT- 06/03/12   RIGHT UPPER LOBE  . Nodule of right lung 06/03/12   Upper Lobe  . On antineoplastic chemotherapy    TARCEVA  . Osteoporosis 2010  . Primary cancer of right upper lobe of lung ([place]) 04/22/2009   Qualifier: Diagnosis of  By: Nils Pyle CMA ([place]), Mearl Latin    . Shortness of breath     hx lung ca   . Status post chemotherapy 2003   CARBOPLATIN/PACLITAXEL /STATUS POST CLINICAL TRAIL OF CELEBREX AND IRESSA AT [facility] FOR 1 YEAR  . Status post radiation therapy 2003   LEFT LUNG  . Status post radiation therapy 11/07/2005   WHOLE BRAIN: [facility]  . Status post radiation therapy 06/02/2008   GAMMA KNIFE OF RESECTED CAVITAY  . Thyroid adenoma    ?  [name] Thyroid cancer ([place]) 10/18/11 bx   adenoid nodules   . Yeast infection     ALLERGIES:  has No Known Allergies.  MEDICATIONS:  Current Outpatient Medications  Medication Sig Dispense Refill  . Ascorbic Acid (VITAMIN C) 100 MG tablet Take 100 mg by mouth every evening.     . cholecalciferol (VITAMIN D) 1000 UNITS tablet Take 1,000 Units by mouth every evening.     . donepezil (ARICEPT) 10 MG tablet Take 1 tablet (10 mg total) by mouth every evening. 90 tablet 2  . loperamide (IMODIUM) 2 MG capsule Take 2 mg by mouth as needed for diarrhea or loose stools.     . Multiple Vitamin (MULTI-VITAMINS) TABS Take 1 tablet by mouth every evening.     [name] TAGRISSO 80 MG tablet TAKE 1 TABLET ('80MG'$ ) BY MOUTH DAILY. 30 tablet 2  . vitamin B-12 (CYANOCOBALAMIN) 1000 MCG tablet Take 1,000 mcg by mouth every evening.    [name] 20 MG TABS tablet Take 1 tablet (20 mg total) by mouth every evening. 90 tablet 1   No current facility-administered medications for this visit.     SURGICAL HISTORY:  Past Surgical History:  Procedure Laterality Date  . BRAIN SURGERY    . LEFT LOWER LOBECTOMY  05/2001  . LEFT OCCIPITAL CRANIOTOMY  09/21/2005   tumor  . LUNG LOBECTOMY    . NECK SURGERY    . ORIF ANKLE FRACTURE Left 10/02/2014   Procedure: OPEN REDUCTION INTERNAL FIXATION (ORIF) ANKLE FRACTURE;  Surgeon: Alta Corning, MD;  Location[facility];  Service: Orthopedics;  Laterality: Left;  . RADICAL NECK DISSECTION  10/18/2011   Procedure: RADICAL NECK DISSECTION;  Surgeon: Izora Gala, MD;  Location: [place];  Service: ENT;  Laterality: Right;  RIGHT  MODIFIED NECK DISSECTION /POSSIBLE RIGHT THYROIDECTOM  . THYROIDECTOMY  10/18/2011   Procedure: THYROIDECTOMY WITH RADICAL NECK DISSECTION;  Surgeon: Izora Gala, MD;  Location: [place];  Service: ENT;  Laterality: Right;    REVIEW OF SYSTEMS:  A comprehensive review of systems was negative except for: Constitutional: positive for fatigue Gastrointestinal: positive for diarrhea   PHYSICAL EXAMINATION: General appearance: alert, cooperative, fatigued and no distress Head: Normocephalic, without obvious abnormality, atraumatic Neck: no adenopathy, supple, symmetrical, trachea midline and thyroid not enlarged, symmetric, no tenderness/mass/nodules Lymph nodes: Cervical, supraclavicular, and axillary nodes normal. Resp: clear to auscultation bilaterally Back: symmetric, no curvature. ROM normal. No CVA tenderness. Cardio: regular rate and rhythm, S1, S2 normal, no murmur, click, rub or gallop  GI: soft, non-tender; bowel sounds normal; no masses,  no organomegaly Extremities: extremities normal, atraumatic, no cyanosis or edema  ECOG PERFORMANCE STATUS: 1 - Symptomatic but completely ambulatory  Blood pressure 128/76, pulse 98, temperature 99.1 F (37.3 C), temperature source Oral, resp. rate 18, height '5\' 7"'$  (1.702 m), weight 170 lb 4.8 oz (77.2 kg), SpO2 100 %.  LABORATORY DATA: Lab Results  Component Value Date   WBC 5.2 03/10/2019   HGB 13.1 03/10/2019   HCT 41.8 03/10/2019   MCV 94.1 03/10/2019   PLT 214 03/10/2019      Chemistry      Component Value Date/Time   NA 142 12/23/2018 1143   NA 148 (H) 10/16/2018 1300   NA 141 08/06/2017 1047   K 4.0 12/23/2018 1143   K 3.9 08/06/2017 1047   CL 108 12/23/2018 1143   CL 106 12/09/2012 0806   CO2 24 12/23/2018 1143   CO2 24 08/06/2017 1047   BUN 12 12/23/2018 1143   BUN 16 10/16/2018 1300   BUN 15.4 08/06/2017 1047   CREATININE 0.98 12/23/2018 1143   CREATININE 1.0 08/06/2017 1047      Component Value Date/Time   CALCIUM 9.6  12/23/2018 1143   CALCIUM 9.8 08/06/2017 1047   ALKPHOS 75 12/23/2018 1143   ALKPHOS 65 08/06/2017 1047   AST 12 (L) 12/23/2018 1143   AST 11 08/06/2017 1047   ALT 9 12/23/2018 1143   ALT 9 08/06/2017 1047   BILITOT 0.6 12/23/2018 1143   BILITOT 0.66 08/06/2017 1047       RADIOGRAPHIC STUDIES: No results found.  ASSESSMENT AND PLAN: This is a very pleasant 73 years old African-American female with metastatic non-small cell lung cancer, adenocarcinoma with positive EGFR mutation status post several years of treatment with Tarceva and the patient developed positive resistant mutation, T790M. The patient is currently on treatment with Tagrisso status post 44 months.  The patient has been tolerating this treatment well with no concerning adverse effect except for occasional diarrhea improved with Imodium. Her recent MRI of the brain showed no concerning findings for brain metastasis. I recommended for the patient to continue her treatment with Tagrisso with the same dose. We will see her back for follow-up visit in 2 months for evaluation and repeat blood work. She was advised to call immediately if she has any concerning symptoms in the interval. The patient voices understanding of current disease status and treatment options and is in agreement with the current care plan. All questions were answered. The patient knows to call the clinic with any problems, questions or concerns. We can certainly see the patient much sooner if necessary.  Disclaimer: This note was dictated with voice recognition software. Similar sounding words can inadvertently be transcribed and may not be corrected upon review.

## 2019-03-11 ENCOUNTER — Other Ambulatory Visit: Payer: Self-pay | Admitting: Pharmacist

## 2019-03-16 ENCOUNTER — Telehealth: Payer: Self-pay

## 2019-03-16 NOTE — Telephone Encounter (Signed)
The pt agreed to have a virtual appointment for evaluation of UTI, her husband Dr. Amadeo Garnet will be picking up the urine cup and wipe int he morning and dropping the specimen off to be ran in the lab.

## 2019-03-17 ENCOUNTER — Ambulatory Visit (INDEPENDENT_AMBULATORY_CARE_PROVIDER_SITE_OTHER): Payer: Medicare Other | Admitting: Internal Medicine

## 2019-03-17 ENCOUNTER — Other Ambulatory Visit: Payer: Self-pay

## 2019-03-17 ENCOUNTER — Encounter: Payer: Self-pay | Admitting: Internal Medicine

## 2019-03-17 DIAGNOSIS — R197 Diarrhea, unspecified: Secondary | ICD-10-CM | POA: Diagnosis not present

## 2019-03-17 DIAGNOSIS — R350 Frequency of micturition: Secondary | ICD-10-CM

## 2019-03-17 LAB — POCT URINALYSIS DIPSTICK
Bilirubin, UA: NEGATIVE
Glucose, UA: NEGATIVE
Ketones, UA: NEGATIVE
Nitrite, UA: POSITIVE
Protein, UA: NEGATIVE
Spec Grav, UA: >=1.03 — AB (ref 1.010–1.025)
Urobilinogen, UA: 0.2 E.U./dL
pH, UA: 5 (ref 5.0–8.0)

## 2019-03-17 MED ORDER — CEPHALEXIN 500 MG PO CAPS: 500.0000 mg | ORAL_CAPSULE | Freq: Two times a day (BID) | ORAL | 0 refills | Status: AC

## 2019-03-17 NOTE — Progress Notes (Signed)
Virtual Visit via Phone   This visit type was conducted due to national recommendations for restrictions regarding the COVID-19 Pandemic (e.g. social distancing) in an effort to limit this patient's exposure and mitigate transmission in our community.  Due to her co-morbid illnesses, this patient is at least at moderate risk for complications without adequate follow up.  This format is felt to be most appropriate for this patient at this time.  All issues noted in this document were discussed and addressed.  A limited physical exam was performed with this format.    This visit type was conducted due to national recommendations for restrictions regarding the COVID-19 Pandemic (e.g. social distancing) in an effort to limit this patient's exposure and mitigate transmission in our community.  Patients identity confirmed using two different identifiers.  This format is felt to be most appropriate for this patient at this time.  All issues noted in this document were discussed and addressed.  No physical exam was performed (except for noted visual exam findings with Video Visits).    Date:  03/17/2019   ID:  Valerie [name], Valerie [name] 1946/01/16, MRN [id number]  Patient Location:  Home  Provider location:   Office    Chief Complaint:  "I think I have a UTI"  History of Present Illness:    Valerie [name] is a 73 y.o. female who presents via video conferencing for a telehealth visit today.    The patient does not have symptoms concerning for COVID-19 infection (fever, chills, cough, or new shortness of breath).   She presents today for virtual visit. She prefers this method of contact due to COVID-19 pandemic.  She reports having urinary frequency and urgency. Symptoms started about 5 days ago. She has also been experiencing diarrhea. She denies fever/chills. Reports it is not unusual for her to have diarrhea due to meds from oncology. She reports the frequency and urgency have worsened over  the past 3-4 days.     Past Medical History:  Diagnosis Date  . Abnormal Pap smear 2006  . Anal fistula   . Ankle fracture   . Anxiety   . Arthritis   . Atrophic vaginitis 2008  . Cataract   . Dementia ([place]) 2009  . Dyspareunia 2008  . H/O osteoporosis   . H/O varicella   . H/O vitamin D deficiency   . Headache 07/24/2016  . Heart murmur   . History of measles, mumps, or rubella   . History of radiation therapy 07/28/13- 08/10/13   right lung metastasis 5000 cGy 10 sessions  . Hypertension   . Lung cancer ([place]) dx'd 2002  . Lung cancer ([place])   . Lung cancer ([place])   . Lung metastasis ([place])    PET scan 05/05/13, RUL lung nodule  . Metastasis to brain [facility]) dx'd 2008  . 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 Nodule of right lung CT- 06/03/12   RIGHT UPPER LOBE  . Nodule of right lung 06/03/12   Upper Lobe  . On antineoplastic chemotherapy    TARCEVA  . Osteoporosis 2010  . Primary cancer of right upper lobe of lung ([place]) 04/22/2009   Qualifier: Diagnosis of  By: Nils Pyle CMA ([place]), Mearl Latin    . Shortness of breath    hx lung ca   . Status post chemotherapy 2003   CARBOPLATIN/PACLITAXEL /STATUS POST CLINICAL TRAIL OF CELEBREX AND IRESSA AT [facility] FOR 1 YEAR  . Status post radiation therapy 2003   LEFT  LUNG  . Status post radiation therapy 11/07/2005   WHOLE BRAIN: DR [facility]arkin [facility]  . Status post radiation therapy 06/02/2008   GAMMA KNIFE OF RESECTED CAVITAY  . Thyroid adenoma    ?  [name] Thyroid cancer ([place]) 10/18/11 bx   adenoid nodules   . Yeast infection    Past Surgical History:  Procedure Laterality Date  . BRAIN SURGERY    . LEFT LOWER LOBECTOMY  05/2001  . LEFT OCCIPITAL CRANIOTOMY  09/21/2005   tumor  . LUNG LOBECTOMY    . NECK SURGERY    . ORIF ANKLE FRACTURE Left 10/02/2014   Procedure: OPEN REDUCTION INTERNAL FIXATION (ORIF) ANKLE FRACTURE;  Surgeon: Alta Corning, MD;  [facility];  Service: Orthopedics;  Laterality: Left;  . RADICAL NECK DISSECTION   10/18/2011   Procedure: RADICAL NECK DISSECTION;  Surgeon: Izora Gala, MD;  Location: [place];  Service: ENT;  Laterality: Right;  RIGHT MODIFIED NECK DISSECTION /POSSIBLE RIGHT THYROIDECTOM  . THYROIDECTOMY  10/18/2011   Procedure: THYROIDECTOMY WITH RADICAL NECK DISSECTION;  Surgeon: Izora Gala, MD;  Location: [place];  Service: ENT;  Laterality: Right;     Current Meds  Medication Sig  . Ascorbic Acid (VITAMIN C) 100 MG tablet Take 100 mg by mouth every evening.   . cholecalciferol (VITAMIN D) 1000 UNITS tablet Take 1,000 Units by mouth every evening.   . donepezil (ARICEPT) 10 MG tablet Take 1 tablet (10 mg total) by mouth every evening.  . loperamide (IMODIUM) 2 MG capsule Take 2 mg by mouth as needed for diarrhea or loose stools.   . Multiple Vitamin (MULTI-VITAMINS) TABS Take 1 tablet by mouth every evening.   [name] TAGRISSO 80 MG tablet TAKE 1 TABLET (80MG ) BY MOUTH DAILY.  . vitamin B-12 (CYANOCOBALAMIN) 1000 MCG tablet Take 1,000 mcg by mouth every evening.  [name] 20 MG TABS tablet Take 1 tablet (20 mg total) by mouth every evening.     Allergies:   Patient has no known allergies.   Social History   Tobacco Use  . Smoking status: Never Smoker  . Smokeless tobacco: Never Used  . Tobacco comment: smoked few years in college  Substance Use Topics  . Alcohol use: No  . Drug use: No     Family Hx: The patient's family history includes Cancer in her father, maternal aunt, and mother; Gait disorder in her mother; Heart failure in her sister.  ROS:   Please see the history of present illness.    Review of Systems  Constitutional: Negative.   Respiratory: Negative.   Cardiovascular: Negative.   Gastrointestinal: Positive for diarrhea.  Genitourinary: Positive for frequency and urgency.  Neurological: Negative.   Psychiatric/Behavioral: Negative.     All other systems reviewed and are negative.   Labs/Other Tests and Data Reviewed:    Recent Labs: 10/16/2018: TSH 2.840  03/10/2019: ALT 8; BUN 14; Creatinine 1.06; Hemoglobin 13.1; Platelet Count 214; Potassium 3.7; Sodium 142   Recent Lipid Panel Lab Results  Component Value Date/Time   CHOL 263 (H) 10/16/2018 01:00 PM   TRIG 149 10/16/2018 01:00 PM   HDL 90 10/16/2018 01:00 PM   CHOLHDL 2.9 10/16/2018 01:00 PM   LDLCALC 143 (H) 10/16/2018 01:00 PM    Wt Readings from Last 3 Encounters:  03/10/19 170 lb 4.8 oz (77.2 kg)  10/16/18 173 lb 9.6 oz (78.7 kg)  10/16/18 173 lb 9.6 oz (78.7 kg)     Exam:    Vital Signs:  There were no vitals taken for this visit.    Physical Exam PE not performed, this is a phone visit.  ASSESSMENT & PLAN:    1. Urinary frequency  Her husband will drop off a urine sample for her. She has frequent UTIs, therefore, I will send off a culture. Rx sent for cephalexin 500mg  twice daily and she is encouraged to complete full abx course.   2. Diarrhea, unspecified type  Intermittent. Unfortunately, this is a side effect from her oral chemotherapy. She is encouraged to limit her intake of fruits/veggies which seem to aggravate her sx. Also advised to eat baked potatoes during flares. Also stressed importance of increasing fluids during flares to ensure that she does not become dehydrated. This is the likely cause of her recent flare of UTI.    COVID-19 Education: The signs and symptoms of COVID-19 were discussed with the patient and how to seek care for testing (follow up with PCP or arrange E-visit).  The importance of social distancing was discussed today.  Patient Risk:   After full review of this patients clinical status, I feel that they are at least moderate risk at this time.  Time:   Today, I have spent 10 minutes with the patient with telehealth technology discussing above diagnoses.     Medication Adjustments/Labs and Tests Ordered: Current medicines are reviewed at length with the patient today.  Concerns regarding medicines are outlined above.   Tests  Ordered: No orders of the defined types were placed in this encounter.   Medication Changes: No orders of the defined types were placed in this encounter.   Disposition:  Follow up prn  Signed, [name], MD

## 2019-03-17 NOTE — Patient Instructions (Signed)
Urinary Tract Infection, Adult A urinary tract infection (UTI) is an infection of any part of the urinary tract. The urinary tract includes:  The kidneys.  The ureters.  The bladder.  The urethra. These organs make, store, and get rid of pee (urine) in the body. What are the causes? This is caused by germs (bacteria) in your genital area. These germs grow and cause swelling (inflammation) of your urinary tract. What increases the risk? You are more likely to develop this condition if:  You have a small, thin tube (catheter) to drain pee.  You cannot control when you pee or poop (incontinence).  You are female, and: ? You use these methods to prevent pregnancy: ? A medicine that kills sperm (spermicide). ? A device that blocks sperm (diaphragm). ? You have low levels of a female hormone (estrogen). ? You are pregnant.  You have genes that add to your risk.  You are sexually active.  You take antibiotic medicines.  You have trouble peeing because of: ? A prostate that is bigger than normal, if you are female. ? A blockage in the part of your body that drains pee from the bladder (urethra). ? A kidney stone. ? A nerve condition that affects your bladder (neurogenic bladder). ? Not getting enough to drink. ? Not peeing often enough.  You have other conditions, such as: ? Diabetes. ? A weak disease-fighting system (immune system). ? Sickle cell disease. ? Gout. ? Injury of the spine. What are the signs or symptoms? Symptoms of this condition include:  Needing to pee right away (urgently).  Peeing often.  Peeing small amounts often.  Pain or burning when peeing.  Blood in the pee.  Pee that smells bad or not like normal.  Trouble peeing.  Pee that is cloudy.  Fluid coming from the vagina, if you are female.  Pain in the belly or lower back. Other symptoms include:  Throwing up (vomiting).  No urge to eat.  Feeling mixed up (confused).  Being tired  and grouchy (irritable).  A fever.  Watery poop (diarrhea). How is this treated? This condition may be treated with:  Antibiotic medicine.  Other medicines.  Drinking enough water. Follow these instructions at home:  Medicines  Take over-the-counter and prescription medicines only as told by your doctor.  If you were prescribed an antibiotic medicine, take it as told by your doctor. Do not stop taking it even if you start to feel better. General instructions  Make sure you: ? Pee until your bladder is empty. ? Do not hold pee for a long time. ? Empty your bladder after sex. ? Wipe from front to back after pooping if you are a female. Use each tissue one time when you wipe.  Drink enough fluid to keep your pee pale yellow.  Keep all follow-up visits as told by your doctor. This is important. Contact a doctor if:  You do not get better after 1-2 days.  Your symptoms go away and then come back. Get help right away if:  You have very bad back pain.  You have very bad pain in your lower belly.  You have a fever.  You are sick to your stomach (nauseous).  You are throwing up. Summary  A urinary tract infection (UTI) is an infection of any part of the urinary tract.  This condition is caused by germs in your genital area.  There are many risk factors for a UTI. These include having a small, thin   tube to drain pee and not being able to control when you pee or poop.  Treatment includes antibiotic medicines for germs.  Drink enough fluid to keep your pee pale yellow. This information is not intended to replace advice given to you by your health care provider. Make sure you discuss any questions you have with your health care provider. Document Released: 01/23/2008 Document Revised: 07/24/2018 Document Reviewed: 02/13/2018 Elsevier Patient Education  2020 [employer]  

## 2019-03-19 MED FILL — TAGRISSO 80 MG TABLET: 80 | 30 days supply | Qty: 30 | Fill #2

## 2019-03-20 LAB — URINE CULTURE

## 2019-03-23 ENCOUNTER — Other Ambulatory Visit: Payer: Self-pay | Admitting: Internal Medicine

## 2019-03-23 MED ORDER — DOXYCYCLINE HYCLATE 100 MG PO CAPS: 100.0000 mg | ORAL_CAPSULE | Freq: Two times a day (BID) | ORAL | 0 refills | Status: DC

## 2019-03-29 IMAGING — CT CT CHEST W/ CM
3 of 9 series · 13 of 36 positions shown, 15 images · IV contrast (iopamidol)
Comparison: 07/20/2016.

CLINICAL DATA: Right lung cancer in 8228 with brain cancer in 6559.
Thyroid cancer in 6559. Chemotherapy and radiation therapy
completed. Thyroidectomy. Asymptomatic. Right upper lobe lung
primary. Stage IV non-small-cell lung cancer.

EXAM:
CT CHEST, ABDOMEN, AND PELVIS WITH CONTRAST
TECHNIQUE: Multidetector CT imaging of the chest, abdomen and pelvis was
performed following the standard protocol during bolus
administration of intravenous contrast.
CONTRAST:  100mL RCOIB8-O88 IOPAMIDOL (RCOIB8-O88) INJECTION 61%

[Series 2: cap with · axial · 0.90mm/px · z∈[-145,+275]mm · 5 of 128 slices shown, 7 images]
[im 22/128  mediastinal]
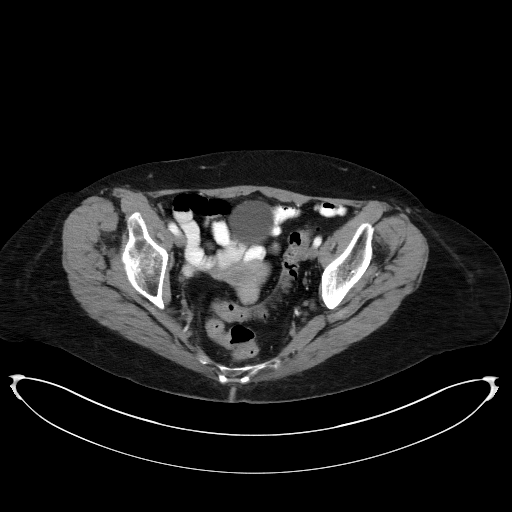
[im 22/128  lung]
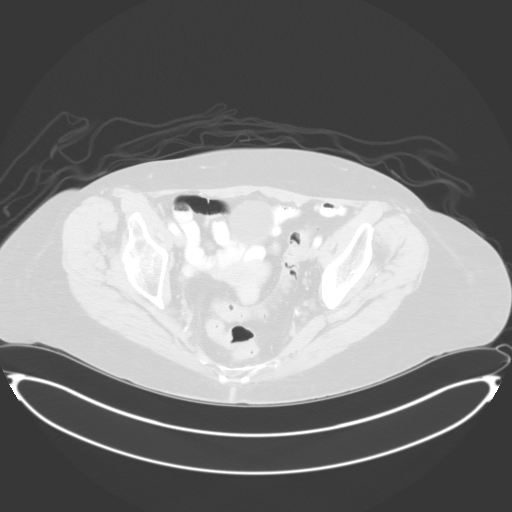
[im 43/128  lung]
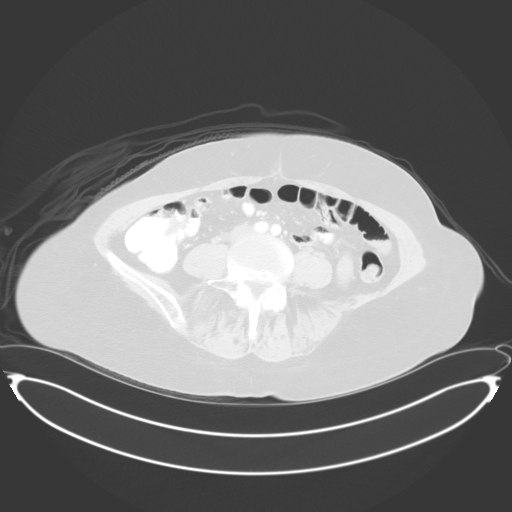
[im 64/128  lung]
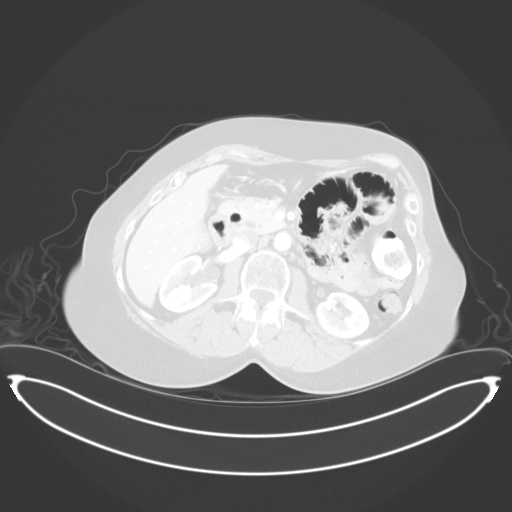
[im 85/128  lung]
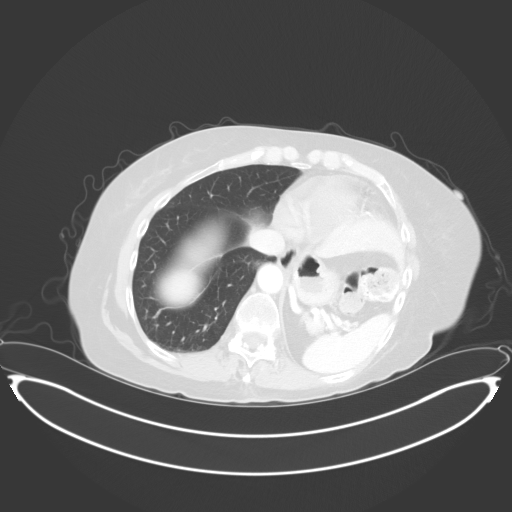
[im 106/128  mediastinal]
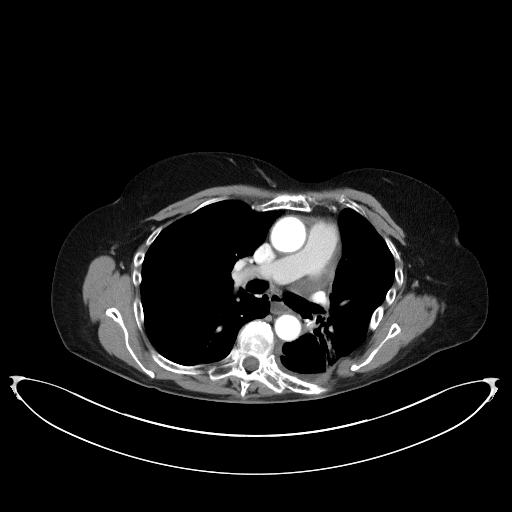
[im 106/128  lung]
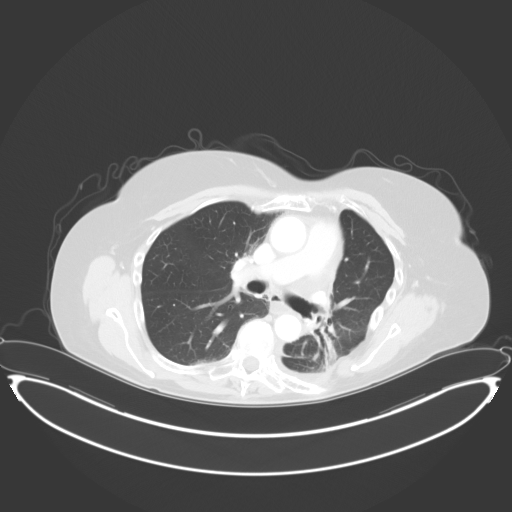

[Series 5: lung · axial · 0.90mm/px · z∈[+155,+339]mm · 5 of 138 slices shown]
[im 23/138  lung]
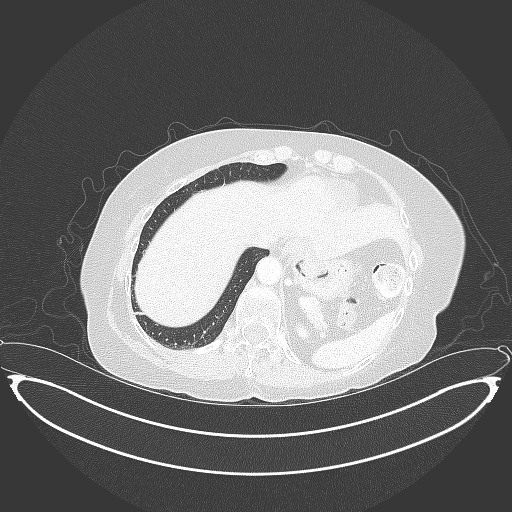
[im 46/138  lung]
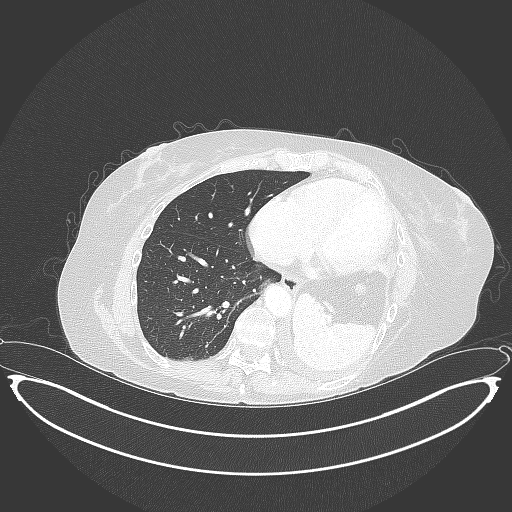
[im 69/138  lung]
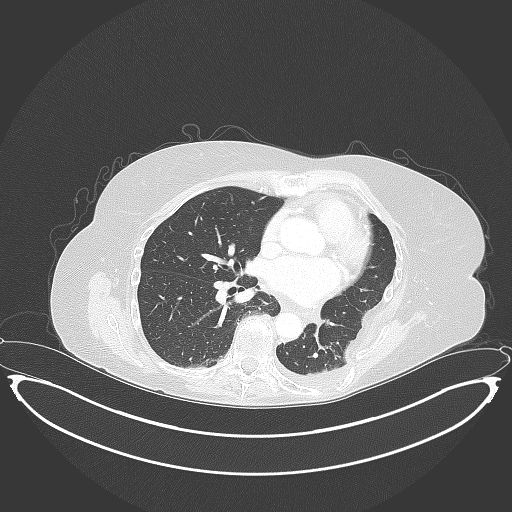
[im 92/138  lung]
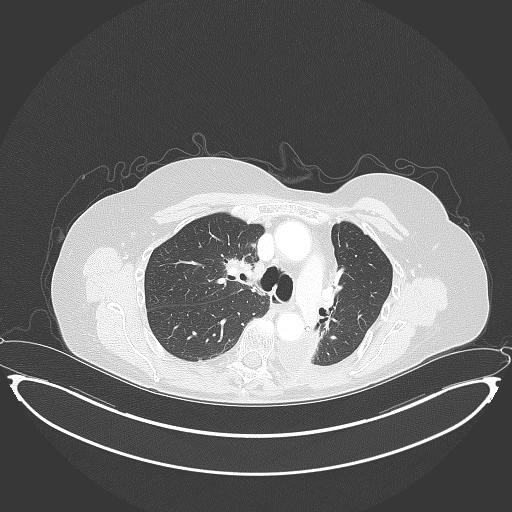
[im 115/138  lung]
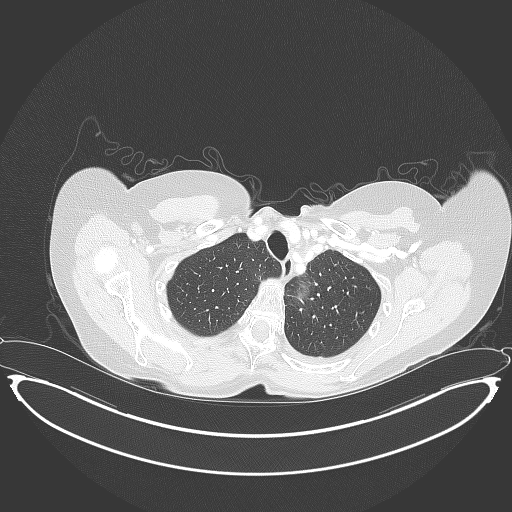

[Series 6: coronals · coronal · 0.77mm/px · 3 of 413 slices shown]
[im 104/413  lung]
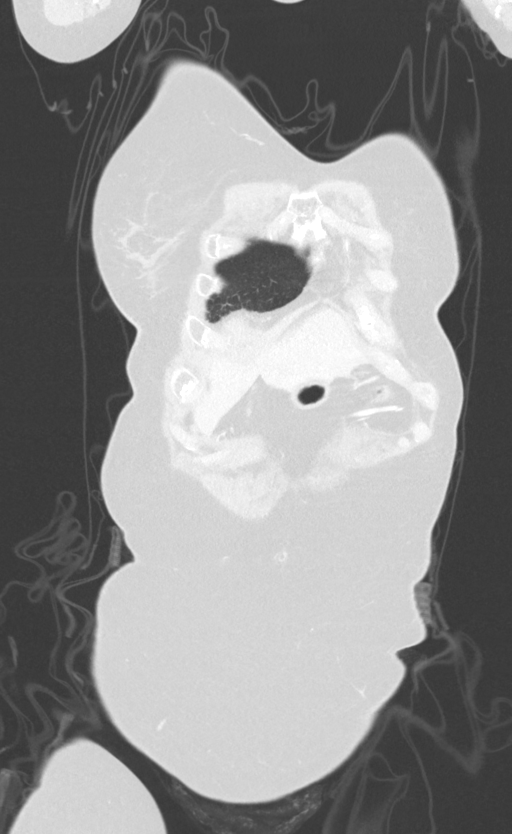
[im 207/413  lung]
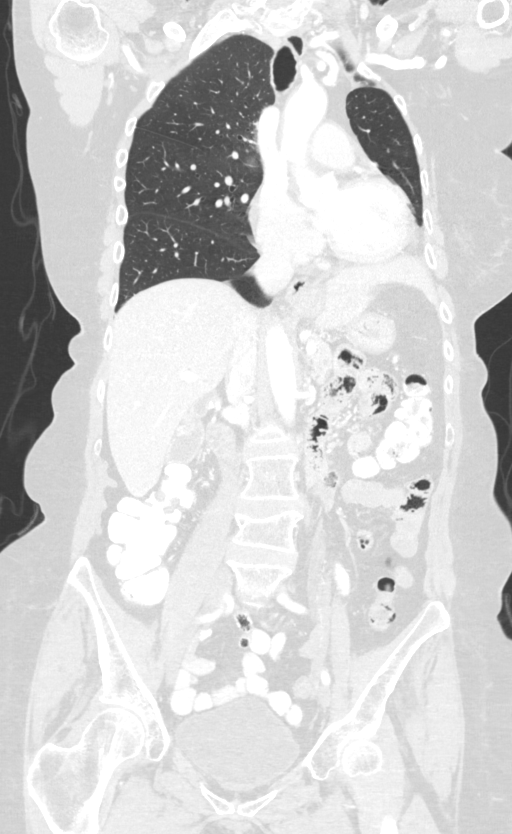
[im 310/413  lung]
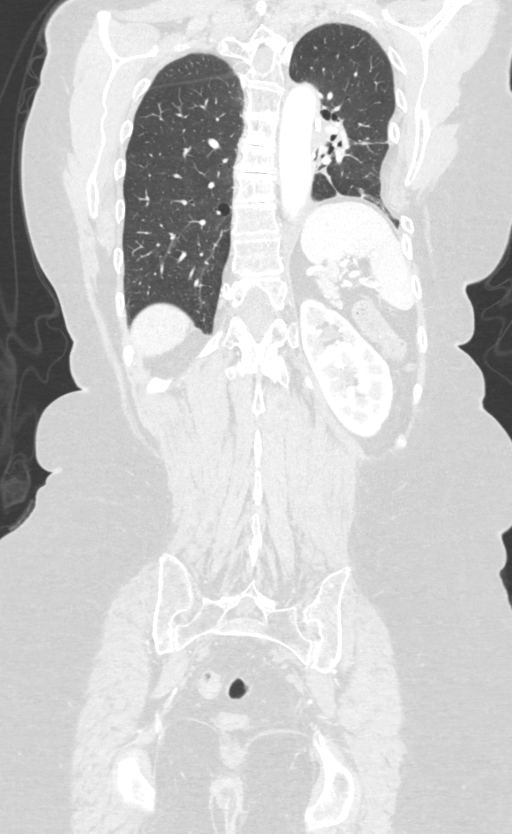

[13 of 36 positions shown; findings below may reference images not displayed]

FINDINGS: CT CHEST FINDINGS

Cardiovascular: Aortic atherosclerosis. Tortuous thoracic aorta.
Mild cardiomegaly, without pericardial effusion. No central
pulmonary embolism, on this non-dedicated study.

Mediastinum/Nodes: Right-sided thyroidectomy. No mediastinal or
hilar adenopathy. Dilated esophagus with fluid level within.

Lungs/Pleura: Small left pleural effusion is not significantly
changed. Status post left lower lobectomy. Peribronchovascular
interstitial thickening in the central right upper lobe is similar,
including on image 46/series 5

4 mm right upper lobe pulmonary nodule is unchanged on image 43/
series 5.

A 3 mm right lower lobe pulmonary nodule on image 91/series 5 is
likely similar to on the prior.

Central left upper lobe 5 mm nodule on image 69/series 5 is likely
present on the prior.

Left paramediastinal radiation fibrosis is again identified

6 mm left upper lobe pulmonary nodule on image 40/ series 5 is
unchanged. Left hemidiaphragm elevation.

Musculoskeletal: Left-sided thoracotomy defect. Irregularity
involving the superior aspect of the sternal body is favored to be
posttraumatic. This is unchanged.

CT ABDOMEN PELVIS FINDINGS

Hepatobiliary: Normal liver. Normal gallbladder, without biliary
ductal dilatation.

Pancreas: Mild pancreatic atrophy with borderline duct dilatation
within the head. This is similar and no cause is identified.

Spleen: Normal in size, without focal abnormality.

Adrenals/Urinary Tract: Normal adrenal glands. Mild renal cortical
thinning bilaterally. At least partially duplicated left renal
collecting system. No hydronephrosis. Trace air in the nondependent
bladder. No bladder wall thickening.

Stomach/Bowel: Normal stomach, without wall thickening. Scattered
colonic diverticula. Normal terminal ileum and appendix. Normal
small bowel.

Vascular/Lymphatic: Abdominal aortic atherosclerosis. No
abdominopelvic adenopathy.

Reproductive: Heterogeneous appearance of the uterus is similar. No
adnexal mass.

Other: No significant free fluid. No evidence of omental or
peritoneal disease.

Musculoskeletal: Mild osteopenia. Similar mild to moderate L4
compression deformity.
IMPRESSION: 1. Surgical changes of left lower lobectomy.
2. Similar bilateral pulmonary nodules with radiation fibrosis. No
findings to suggest Progressive disease.
3. Air within the urinary bladder could be iatrogenic. Correlate
with instrumentation.
4. Heterogeneous uterus, likely related to a small underlying
fibroids.
5. Similar small left pleural effusion.
6. Esophageal air fluid level suggests dysmotility or
gastroesophageal reflux.

## 2019-04-16 ENCOUNTER — Ambulatory Visit: Payer: Medicare Other | Admitting: Internal Medicine

## 2019-04-30 ENCOUNTER — Other Ambulatory Visit: Payer: Self-pay | Admitting: Internal Medicine

## 2019-04-30 DIAGNOSIS — C3411 Malignant neoplasm of upper lobe, right bronchus or lung: Secondary | ICD-10-CM

## 2019-05-04 MED FILL — TAGRISSO 80 MG TABLET: 80 | 30 days supply | Qty: 30 | Fill #0

## 2019-05-07 ENCOUNTER — Other Ambulatory Visit: Payer: Self-pay

## 2019-05-07 ENCOUNTER — Telehealth (INDEPENDENT_AMBULATORY_CARE_PROVIDER_SITE_OTHER): Payer: Medicare Other | Admitting: Internal Medicine

## 2019-05-07 ENCOUNTER — Encounter: Payer: Self-pay | Admitting: Internal Medicine

## 2019-05-07 DIAGNOSIS — F0390 Unspecified dementia without behavioral disturbance: Secondary | ICD-10-CM

## 2019-05-07 DIAGNOSIS — R269 Unspecified abnormalities of gait and mobility: Secondary | ICD-10-CM

## 2019-05-07 DIAGNOSIS — N390 Urinary tract infection, site not specified: Secondary | ICD-10-CM | POA: Diagnosis not present

## 2019-05-07 NOTE — Patient Instructions (Signed)
Deconditioning Deconditioning refers to the changes in your body that occur during a period of inactivity. The changes happen in your heart, lungs, and muscles. They decrease your ability to be active, and they make you feel tired and weak. There are three stages of deconditioning:  Mild deconditioning. At this stage, you will notice a change in your ability to do your usual exercise activities, such as running, biking, or swimming.  Moderate deconditioning. At this stage, you will notice a change in your ability to do normal everyday activities, such as walking, grocery shopping, and doing chores.  Severe deconditioning. At this stage, you will notice a change in your ability to do minimal activity or normal self-care. Deconditioning can occur after only a few days of inactivity. The longer the period of inactivity, the more severe the deconditioning will be, and the longer it will take to return to your previous level of functioning. What are the causes? Deconditioning is often caused by inactivity due to:  Illnesses, such as cancer, stroke, heart attack, fibromyalgia, and chronic fatigue syndrome.  Injuries, especially back injuries, broken bones, and ligament and tendon injuries.  A long stay in the hospital.  Pregnancy, especially if long periods of bed rest are needed. What increases the risk? This condition is more likely to develop in:  People who are hospitalized.  People on bed rest.  People who are obese.  People with poor nutrition.  Elderly adults.  People with injuries or illnesses that interfere with movement and activity. What are the signs or symptoms? Symptoms of deconditioning include:  Weakness.  Tiredness.  Shortness of breath with minor exertion.  A faster-than-normal heartbeat. You may not notice this without taking your pulse.  Pain or discomfort with activity.  Decreased strength.  Decreased sense of balance.  Decreased endurance.   Difficulty doing your usual forms of exercise.  Difficulty doing activities of daily living, such as grocery shopping or chores.  Difficulty walking around the house and doing basic self-care, such as getting to the bathroom, preparing meals, or doing laundry. How is this diagnosed? Deconditioning is diagnosed based on your medical history and a physical exam. During the physical exam, your health care provider will check for signs of deconditioning, such as:  Decreased size of muscles.  Decreased strength.  Trouble with balance.  Shortness of breath or abnormally increased heart rate after minor exertion. How is this treated? Treatment for deconditioning usually involves following a structured exercise program in which activity is increased gradually. Your health care provider will determine which exercises are right for you. The exercise program will likely include aerobic exercise and strength training:  Aerobic exercise helps improve the functioning of the heart and lungs as well as the muscles.  Strength training helps improve muscle size and strength. Both of these types of exercise will improve your endurance. You may be referred to a physical therapist who can create a safe strengthening program for you to follow. Follow these instructions at home:  Follow the exercise program that is recommended by your health care provider or physical therapist.  Do not increase your exercise any faster than directed.  Eat a healthy diet.  Do not use any products that contain nicotine or tobacco, such as cigarettes and e-cigarettes. If you need help quitting, ask your health care provider.  Take over-the-counter and prescription medicines only as told by your health care provider.  Keep all follow-up visits as told by your health care provider. This is important. Contact a  health care provider if:  You are not able to carry out the prescribed exercise program.  You are becoming more  and more fatigued and weak.  You become light-headed when rising to a sitting or standing position.  Your level of endurance decreases after it has improved. Get help right away if:  You have chest pain.  You are very short of breath.  You have any episodes of passing out. This information is not intended to replace advice given to you by your health care provider. Make sure you discuss any questions you have with your health care provider. Document Released: 12/21/2013 Document Revised: 07/19/2017 Document Reviewed: 11/05/2015 Elsevier Patient Education  2020 [employer].

## 2019-05-11 IMAGING — CR DG LUMBAR SPINE COMPLETE 4+V
5 series · 5 of 5 positions shown · non-contrast
Comparison: None in PACs

CLINICAL DATA: Status post fall 10 days ago with acute onset of
back pain since then without radicular symptoms. History of lung
malignancy.

EXAM:
LUMBAR SPINE - COMPLETE 4+ VIEW

[w lumbar spine ap]
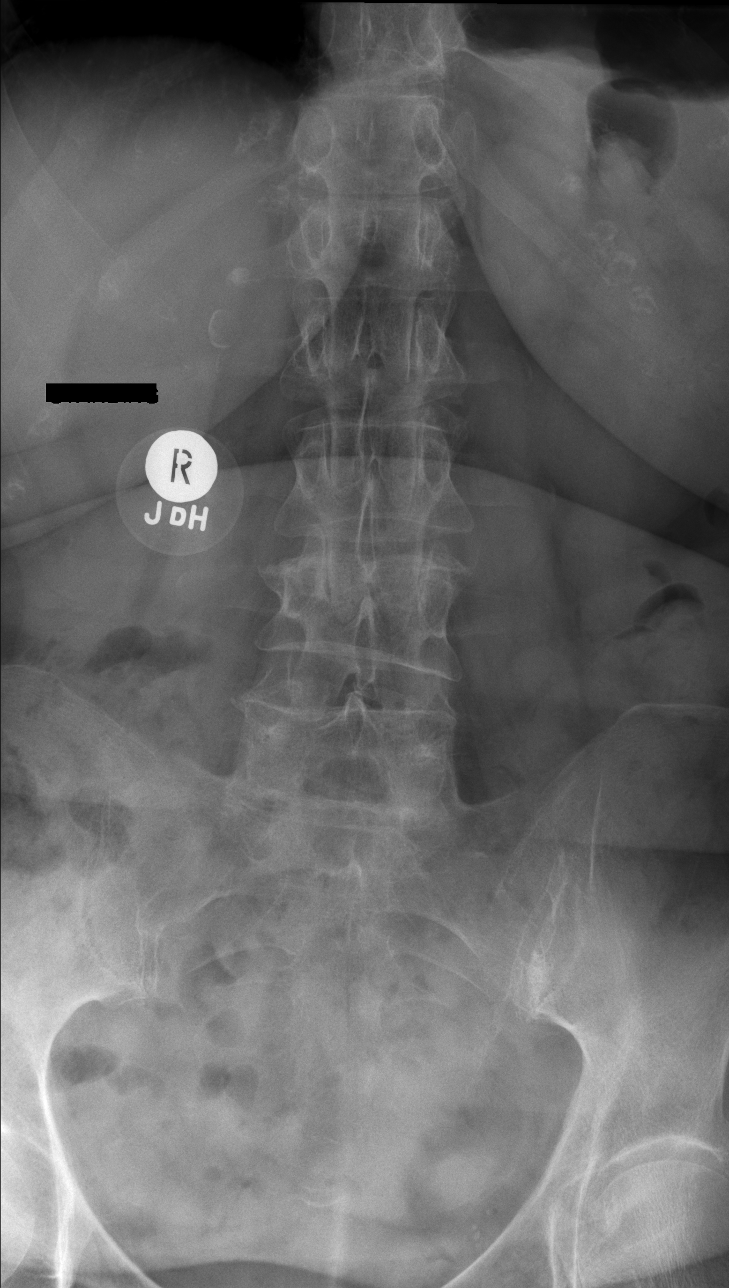

[w lumbar spine obl (1 of 2)]
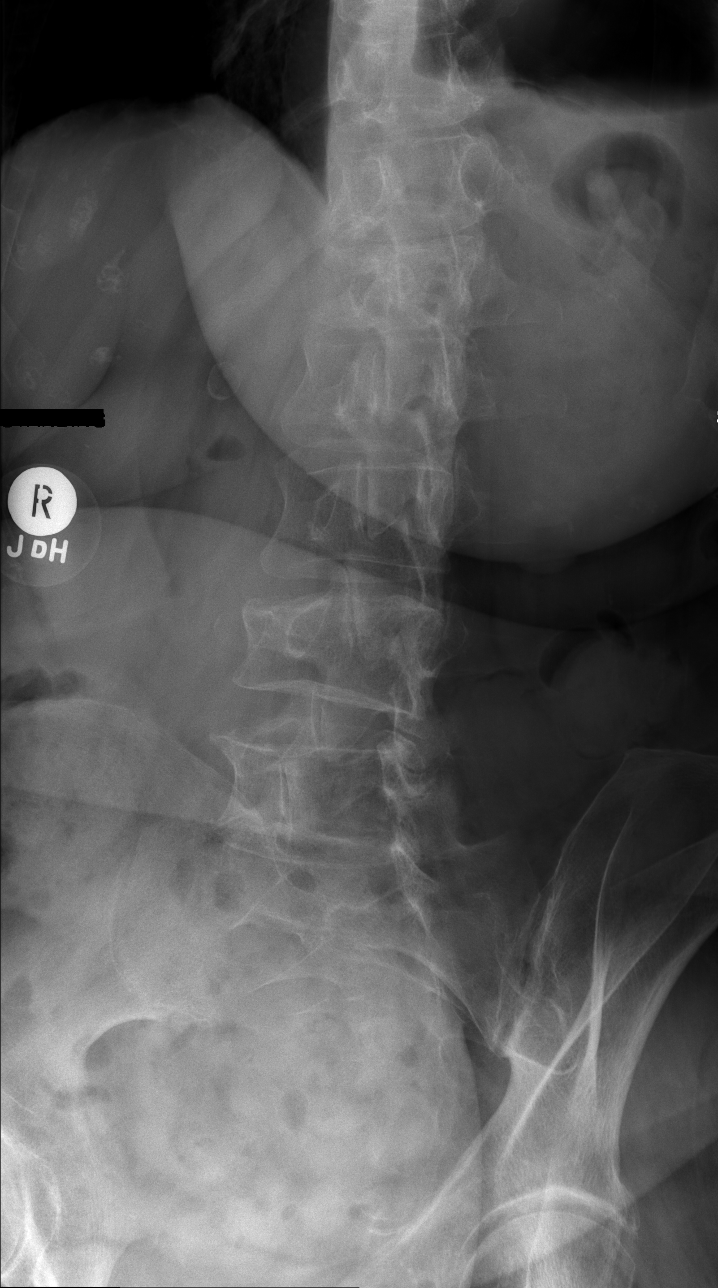

[w lumbar spine obl (2 of 2)]
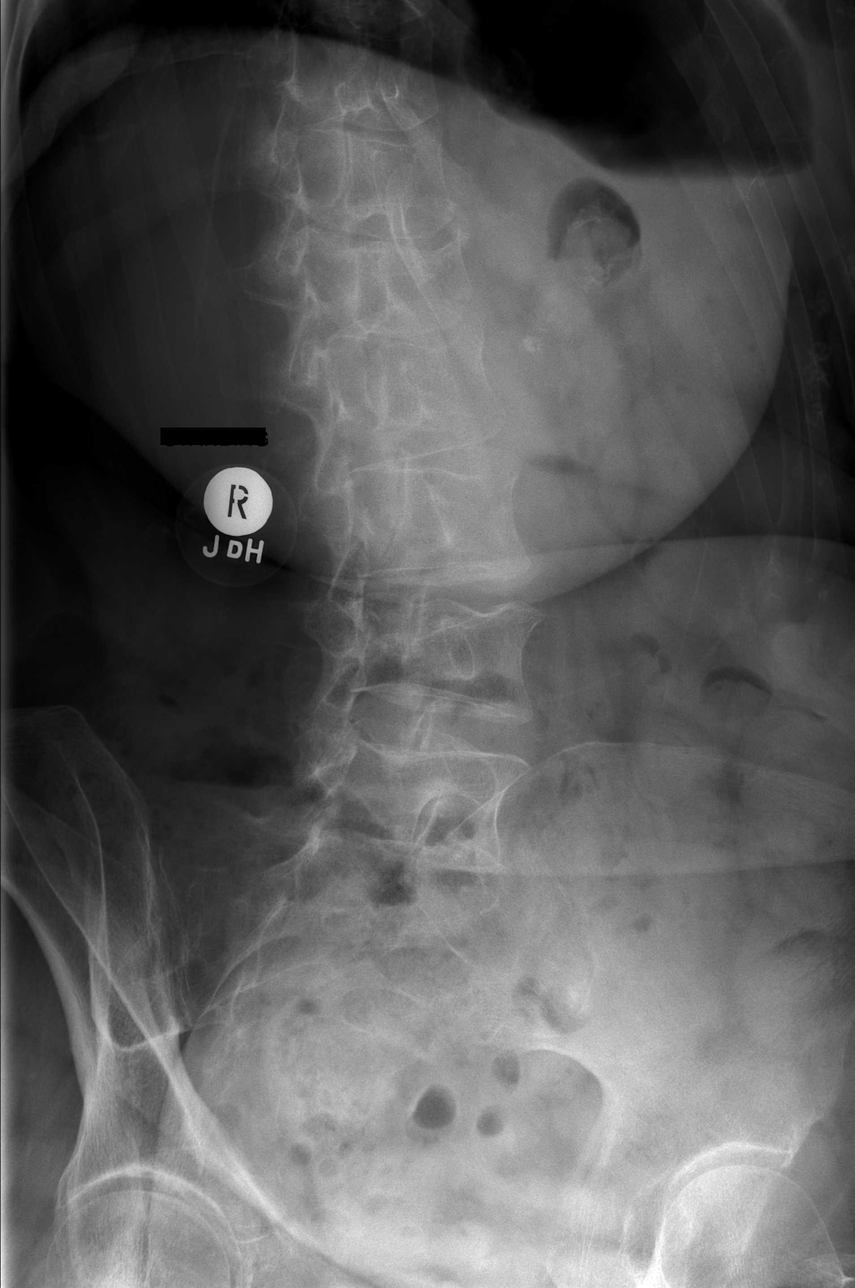

[w lumbar spine lat]
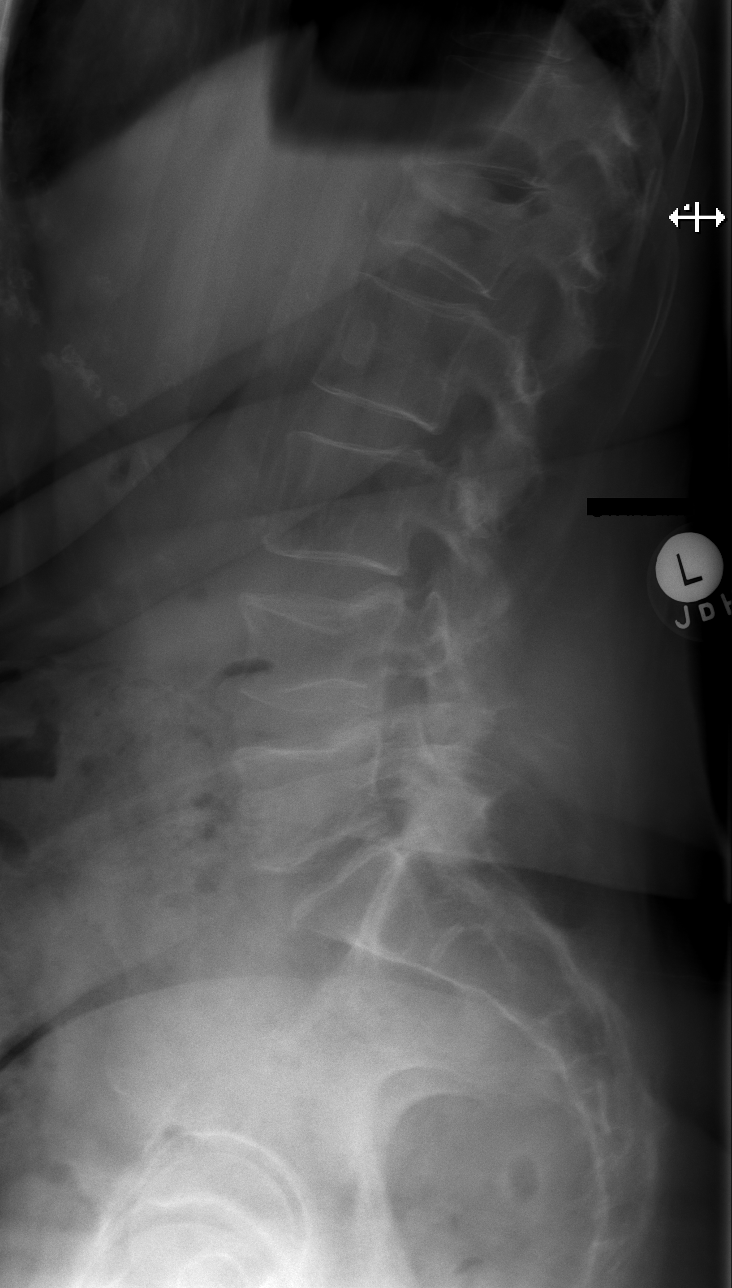

[w lumbar l-5 s-1 spot]
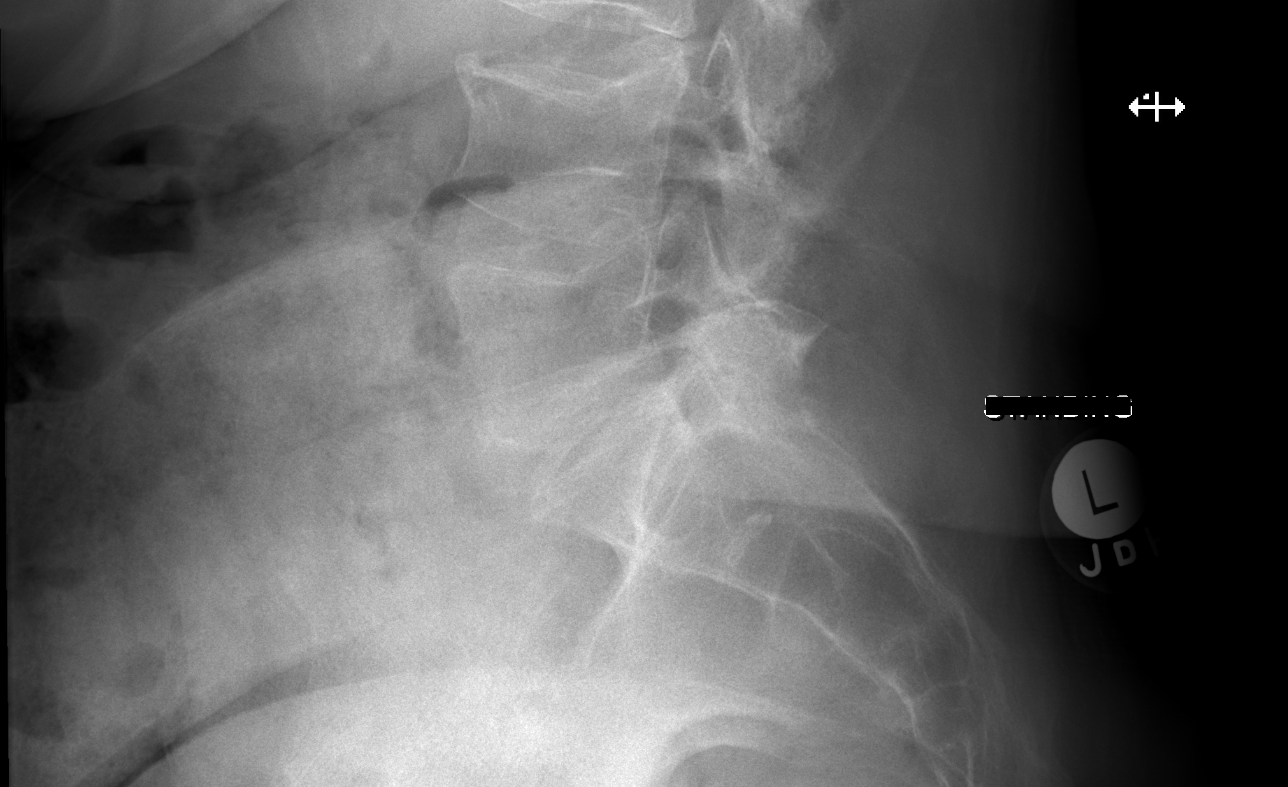

[5 of 5 positions shown; findings below may reference images not displayed]

FINDINGS: There is compression of the body of L1 amounting to approximately
20% anteriorly and 10% posteriorly. There is 10% anterior
compression of L4 with prominent superior and inferior endplate
depression. There is no spondylolisthesis. The disc space heights
are well maintained. There is degenerative facet joint change at
L4-5 and L5-S1. There is no spondylolisthesis. There is facet joint
hypertrophy at L4-5 and L5-S1.
IMPRESSION: Compression fractures of L1 and L4 that are of uncertain age given
the lack of any previous studies. These could certainly be
responsible for the patient's symptoms.

## 2019-05-12 ENCOUNTER — Other Ambulatory Visit: Payer: Medicare Other

## 2019-05-12 ENCOUNTER — Inpatient Hospital Stay: Payer: Medicare Other | Attending: Internal Medicine

## 2019-05-12 ENCOUNTER — Telehealth: Payer: Self-pay | Admitting: Internal Medicine

## 2019-05-12 ENCOUNTER — Encounter: Payer: Self-pay | Admitting: Internal Medicine

## 2019-05-12 ENCOUNTER — Inpatient Hospital Stay (HOSPITAL_BASED_OUTPATIENT_CLINIC_OR_DEPARTMENT_OTHER): Payer: Medicare Other | Admitting: Internal Medicine

## 2019-05-12 ENCOUNTER — Ambulatory Visit: Payer: Medicare Other | Admitting: Internal Medicine

## 2019-05-12 ENCOUNTER — Other Ambulatory Visit: Payer: Self-pay

## 2019-05-12 VITALS — BP 117/74 | HR 122 | Temp 98.7°F | Resp 18 | Ht 67.0 in | Wt 171.8 lb

## 2019-05-12 DIAGNOSIS — C3411 Malignant neoplasm of upper lobe, right bronchus or lung: Secondary | ICD-10-CM

## 2019-05-12 DIAGNOSIS — Z5111 Encounter for antineoplastic chemotherapy: Secondary | ICD-10-CM | POA: Diagnosis not present

## 2019-05-12 DIAGNOSIS — Z85850 Personal history of malignant neoplasm of thyroid: Secondary | ICD-10-CM | POA: Insufficient documentation

## 2019-05-12 DIAGNOSIS — C719 Malignant neoplasm of brain, unspecified: Secondary | ICD-10-CM | POA: Diagnosis not present

## 2019-05-12 DIAGNOSIS — Z7901 Long term (current) use of anticoagulants: Secondary | ICD-10-CM | POA: Diagnosis not present

## 2019-05-12 DIAGNOSIS — C7931 Secondary malignant neoplasm of brain: Secondary | ICD-10-CM | POA: Insufficient documentation

## 2019-05-12 DIAGNOSIS — I10 Essential (primary) hypertension: Secondary | ICD-10-CM | POA: Diagnosis not present

## 2019-05-12 DIAGNOSIS — C3490 Malignant neoplasm of unspecified part of unspecified bronchus or lung: Secondary | ICD-10-CM | POA: Diagnosis not present

## 2019-05-12 DIAGNOSIS — R000 Tachycardia, unspecified: Secondary | ICD-10-CM | POA: Diagnosis not present

## 2019-05-12 LAB — CMP (CANCER CENTER ONLY)
ALT: 7 U/L (ref 0–44)
AST: 10 U/L — ABNORMAL LOW (ref 15–41)
Albumin: 4.5 g/dL (ref 3.5–5.0)
Alkaline Phosphatase: 76 U/L (ref 38–126)
Anion gap: 11 (ref 5–15)
BUN: 14 mg/dL (ref 8–23)
CO2: 27 mmol/L (ref 22–32)
Calcium: 9.8 mg/dL (ref 8.9–10.3)
Chloride: 105 mmol/L (ref 98–111)
Creatinine: 1.07 mg/dL — ABNORMAL HIGH (ref 0.44–1.00)
GFR, Est AFR Am: 60 mL/min — ABNORMAL LOW (ref >60)
GFR, Estimated: 51 mL/min — ABNORMAL LOW (ref >60)
Glucose, Bld: 96 mg/dL (ref 70–99)
Potassium: 3.9 mmol/L (ref 3.5–5.1)
Sodium: 143 mmol/L (ref 135–145)
Total Bilirubin: 0.7 mg/dL (ref 0.3–1.2)
Total Protein: 7.1 g/dL (ref 6.5–8.1)

## 2019-05-12 LAB — CBC WITH DIFFERENTIAL (CANCER CENTER ONLY)
Abs Immature Granulocytes: 0.01 10*3/uL (ref 0.00–0.07)
Basophils Absolute: 0 10*3/uL (ref 0.0–0.1)
Basophils Relative: 1 %
Eosinophils Absolute: 0.1 10*3/uL (ref 0.0–0.5)
Eosinophils Relative: 2 %
HCT: 42.1 % (ref 36.0–46.0)
Hemoglobin: 13.3 g/dL (ref 12.0–15.0)
Immature Granulocytes: 0 %
Lymphocytes Relative: 18 %
Lymphs Abs: 1.1 10*3/uL (ref 0.7–4.0)
MCH: 30 pg (ref 26.0–34.0)
MCHC: 31.6 g/dL (ref 30.0–36.0)
MCV: 94.8 fL (ref 80.0–100.0)
Monocytes Absolute: 0.5 10*3/uL (ref 0.1–1.0)
Monocytes Relative: 8 %
Neutro Abs: 4.3 10*3/uL (ref 1.7–7.7)
Neutrophils Relative %: 71 %
Platelet Count: 221 10*3/uL (ref 150–400)
RBC: 4.44 MIL/uL (ref 3.87–5.11)
RDW: 13.4 % (ref 11.5–15.5)
WBC Count: 6 10*3/uL (ref 4.0–10.5)
nRBC: 0 % (ref 0.0–0.2)

## 2019-05-12 NOTE — Telephone Encounter (Signed)
Scheduled per 09/22 los, patient received after visit summary and calender.

## 2019-05-12 NOTE — Progress Notes (Signed)
[place] [phone]   Fax:([phone]  OFFICE PROGRESS NOTE  [name], [address] [place] [address]  PRINCIPAL DIAGNOSIS:  metastatic non-small cell lung cancer, adenocarcinoma with brain metastases initially diagnosed as a stage IIb in October 2002.   BIOMARKERS TESTING ([facility]): Positive for: EGFR [id number] del, T790M, CTNNB1 D32G, SMAD4 T164f*10 Negative for: RET, ALK, BRAF, KRAS, ERBB2 and MET  PRIOR THERAPY:  1. Status post left lower lobectomy under the care of Dr BArlyce Dicein October 2002. 2. Status post course of concurrent chemoradiation with weekly carboplatin and paclitaxel under the care of Dr SBenay Spiceand Dr WElba Barmanin early 2003. 3. Status post treatment with Celebrex and [facility] 4. Status post left occipital craniotomy for tumor resection on September 21, 2005, under the care of Dr CChristella Noa 5. Status post whole brain irradiation under the care of Dr WElba Barman completed November 07, 2005. 6. Status post gamma knife treatment [facility] the resected [facility]. Status post right thyroidectomy with radical neck dissection under the care of Dr. RConstance Holsteron 10/18/2011. 8. Status post excisional biopsy of right cervical lymph node that was consistent with metastatic adenocarcinoma. 9. Tarceva 150 mg by mouth daily with therapy beginning 06/03/2007, discontinued in July 2015 secondary to disease progression. Status post 79 cycles. 10. palliative radiotherapy to the enlarging right upper lobe lung nodule under the care of Dr. MValere Dross [facility]. [id number] 300 mg by mouth daily for 21 days discontinued secondary to study deviation and [facility]. 12. Open reduction and  internal fixation of trimalleolar ankle fracture dislocation with fixation of the fibula as well as medial malleolus.   CURRENT THERAPY: Tagrisso 80 mg by mouth daily started 11/25/2014, status post 53 months of treatment.  INTERVAL HISTORY: Valerie MARSTON73y.o. female returns to the clinic today for follow-up visit.  The patient is feeling fine today with no concerning complaints except for tachycardia.  She denied having any current chest pain, shortness of breath, cough or hemoptysis.  She denied having any fever or chills.  She has no nausea, vomiting, diarrhea or constipation.  She has no headache or visual changes.  She had MRI of the brain few months ago that showed no concerning findings for brain metastasis.  The patient continues to tolerate her treatment with Tagrisso fairly well.  She is here for evaluation and repeat blood work.   MEDICAL HISTORY: Past Medical History:  Diagnosis Date  . Abnormal Pap smear 2006  . Anal fistula   . Ankle fracture   . Anxiety   . Arthritis   . Atrophic vaginitis 2008  . Cataract   . Dementia ([facility] 2009  . Dyspareunia 2008  . H/O osteoporosis   . H/O varicella   . H/O vitamin D deficiency   . Headache 07/24/2016  . Heart murmur   . History of measles, mumps, or rubella   . History of radiation therapy 07/28/13- 08/10/13   right lung metastasis 5000 cGy 10 sessions  . Hypertension   . [facility] [place] [place]  . [place] ([facility]   . [place] cancer ([facility]   . Lung metastasis ([facility] [place]    PET scan 05/05/13,  RUL lung nodule  . Metastasis to brain [facility]) dx'd 2008  . 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 Nodule of right lung CT- 06/03/12   RIGHT UPPER LOBE  . Nodule of right lung 06/03/12   Upper Lobe  . On antineoplastic chemotherapy    TARCEVA  . Osteoporosis 2010  . Primary cancer of right upper lobe of lung ([place]) 04/22/2009   Qualifier: Diagnosis of  By: Nils Pyle CMA ([place]), Mearl Latin    . Shortness of breath    hx lung ca    . Status post chemotherapy 2003   CARBOPLATIN/PACLITAXEL /STATUS POST CLINICAL TRAIL OF CELEBREX AND IRESSA AT [facility] FOR 1 YEAR  . Status post radiation therapy 2003   LEFT LUNG  . Status post radiation therapy 11/07/2005   WHOLE BRAIN: DR Larkin [facility]  . Status post radiation therapy 06/02/2008   GAMMA KNIFE OF RESECTED CAVITAY  . Thyroid adenoma    ?  [name] Thyroid cancer ([place]) 10/18/11 bx   adenoid nodules   . Yeast infection     ALLERGIES:  has No Known Allergies.  MEDICATIONS:  Current Outpatient Medications  Medication Sig Dispense Refill  . Ascorbic Acid (VITAMIN C) 100 MG tablet Take 100 mg by mouth every evening.     . cholecalciferol (VITAMIN D) 1000 UNITS tablet Take 1,000 Units by mouth every evening.     . donepezil (ARICEPT) 10 MG tablet Take 1 tablet (10 mg total) by mouth every evening. 90 tablet 2  . doxycycline (VIBRAMYCIN) 100 MG capsule Take 1 capsule (100 mg total) by mouth 2 (two) times daily. 14 capsule 0  . loperamide (IMODIUM) 2 MG capsule Take 2 mg by mouth as needed for diarrhea or loose stools.     . Multiple Vitamin (MULTI-VITAMINS) TABS Take 1 tablet by mouth every evening.     [name] TAGRISSO 80 MG tablet TAKE 1 TABLET ('80MG'$ ) BY MOUTH DAILY. 30 tablet 2  . vitamin B-12 (CYANOCOBALAMIN) 1000 MCG tablet Take 1,000 mcg by mouth every evening.    [name] 20 MG TABS tablet Take 1 tablet (20 mg total) by mouth every evening. 90 tablet 1   No current facility-administered medications for this visit.     SURGICAL HISTORY:  Past Surgical History:  Procedure Laterality Date  . BRAIN SURGERY    . LEFT LOWER LOBECTOMY  05/2001  . LEFT OCCIPITAL CRANIOTOMY  09/21/2005   tumor  . LUNG LOBECTOMY    . NECK SURGERY    . ORIF ANKLE FRACTURE Left 10/02/2014   Procedure: OPEN REDUCTION INTERNAL FIXATION (ORIF) ANKLE FRACTURE;  Surgeon: Alta Corning, MD;  Location: [facility];  Service: Orthopedics;  Laterality: Left;  . RADICAL NECK DISSECTION  10/18/2011   Procedure: RADICAL  NECK DISSECTION;  Surgeon: Izora Gala, MD;  Location: [facility];  Service: ENT;  Laterality: Right;  RIGHT MODIFIED NECK DISSECTION /POSSIBLE RIGHT THYROIDECTOM  . THYROIDECTOMY  10/18/2011   Procedure: THYROIDECTOMY WITH RADICAL NECK DISSECTION;  Surgeon: Izora Gala, MD;  Location: [facility];  Service: ENT;  Laterality: Right;    REVIEW OF SYSTEMS:  A comprehensive review of systems was negative except for: Constitutional: positive for fatigue Cardiovascular: positive for Tachycardia   PHYSICAL EXAMINATION: General appearance: alert, cooperative, fatigued and no distress Head: Normocephalic, without obvious abnormality, atraumatic Neck: no adenopathy, supple, symmetrical, trachea midline and thyroid not enlarged, symmetric, no tenderness/mass/nodules Lymph nodes: Cervical, supraclavicular, and axillary nodes normal. Resp: clear to auscultation bilaterally Back: symmetric, no curvature.  ROM normal. No CVA tenderness. Cardio: Tachycardia GI: soft, non-tender; bowel sounds normal; no masses,  no organomegaly Extremities: extremities normal, atraumatic, no cyanosis or edema  ECOG PERFORMANCE STATUS: 1 - Symptomatic but completely ambulatory  Blood pressure 117/74, pulse (!) 122, temperature 98.7 F (37.1 C), temperature source Temporal, resp. rate 18, height '5\' 7"'$  (1.702 m), weight 171 lb 12.8 oz (77.9 kg), SpO2 99 %.  LABORATORY DATA: Lab Results  Component Value Date   WBC 6.0 05/12/2019   HGB 13.3 05/12/2019   HCT 42.1 05/12/2019   MCV 94.8 05/12/2019   PLT 221 05/12/2019      Chemistry      Component Value Date/Time   NA 143 05/12/2019 1051   NA 148 (H) 10/16/2018 1300   NA 141 08/06/2017 1047   K 3.9 05/12/2019 1051   K 3.9 08/06/2017 1047   CL 105 05/12/2019 1051   CL 106 12/09/2012 0806   CO2 27 05/12/2019 1051   CO2 24 08/06/2017 1047   BUN 14 05/12/2019 1051   BUN 16 10/16/2018 1300   BUN 15.4 08/06/2017 1047   CREATININE 1.07 (H) 05/12/2019 1051   CREATININE 1.0  08/06/2017 1047      Component Value Date/Time   CALCIUM 9.8 05/12/2019 1051   CALCIUM 9.8 08/06/2017 1047   ALKPHOS 76 05/12/2019 1051   ALKPHOS 65 08/06/2017 1047   AST 10 (L) 05/12/2019 1051   AST 11 08/06/2017 1047   ALT 7 05/12/2019 1051   ALT 9 08/06/2017 1047   BILITOT 0.7 05/12/2019 1051   BILITOT 0.66 08/06/2017 1047       RADIOGRAPHIC STUDIES: No results found.  ASSESSMENT AND PLAN: This is a very pleasant 73 years old African-American female with metastatic non-small cell lung cancer, adenocarcinoma with positive EGFR mutation status post several years of treatment with Tarceva and the patient developed positive resistant mutation, T790M. The patient is currently on treatment with Tagrisso status post 53 months.  The patient continues to tolerate this treatment well with no concerning adverse effects. I recommended for her to continue her current treatment with Tagrisso with the same dose. I will see her back for follow-up visit in 6 weeks with repeat CT scan of the neck, chest, abdomen and pelvis for restaging of her disease. Her recent MRI of the brain showed no concerning findings for brain metastasis. For the tachycardia, we arranged for the patient to have EKG today that showed sinus tachycardia with heart rate of 108.  She was advised to increase her oral intake. The patient was advised to call immediately if she has any other concerning symptoms in the interval. The patient voices understanding of current disease status and treatment options and is in agreement with the current care plan. All questions were answered. The patient knows to call the clinic with any problems, questions or concerns. We can certainly see the patient much sooner if necessary. I spent 20 minutes counseling the patient face to face. The total time spent in the appointment was 25 minutes. Disclaimer: This note was dictated with voice recognition software. Similar sounding words can  inadvertently be transcribed and may not be corrected upon review.

## 2019-05-15 ENCOUNTER — Telehealth: Payer: Self-pay

## 2019-05-15 NOTE — Telephone Encounter (Signed)
Called pt to inform her of dr note.  pleae contact pt - I read over her oncology note- I understand she has tachycardia. she can consider magnesium 125mg -250mg  at night. This can help with palpitations. Also good for bp and bones.

## 2019-05-17 NOTE — Progress Notes (Signed)
Virtual Visit via Phone   This visit type was conducted due to national recommendations for restrictions regarding the COVID-19 Pandemic (e.g. social distancing) in an effort to limit this patient's exposure and mitigate transmission in our community.  Due to her co-morbid illnesses, this patient is at least at moderate risk for complications without adequate follow up.  This format is felt to be most appropriate for this patient at this time.  All issues noted in this document were discussed and addressed.  A limited physical exam was performed with this format.    This visit type was conducted due to national recommendations for restrictions regarding the COVID-19 Pandemic (e.g. social distancing) in an effort to limit this patient's exposure and mitigate transmission in our community.  Patients identity confirmed using two different identifiers.  This format is felt to be most appropriate for this patient at this time.  All issues noted in this document were discussed and addressed.  No physical exam was performed (except for noted visual exam findings with Video Visits).    Date:  05/17/2019   ID:  Valerie [name], Valerie [name] 1946/01/11, MRN [id number]  Patient Location:  Home, accompanied by her husband  Provider location:   Office    Chief Complaint:  "I need f/u. I do not want to come into the office"  History of Present Illness:    Valerie [name] is a 73 y.o. female who presents via video conferencing for a telehealth visit today.    The patient does not have symptoms concerning for COVID-19 infection (fever, chills, cough, or new shortness of breath).   She presents today for virtual visit. She prefers this method of contact due to COVID-19 pandemic.  She is a cancer survivor and currently on oral chemo, she does not wish to expose herself to other patients. She has been taking donepezil for cognitive decline. She feels well on meds. Her husband adds that he has no concerns.      Past Medical History:  Diagnosis Date  . Abnormal Pap smear 2006  . Anal fistula   . Ankle fracture   . Anxiety   . Arthritis   . Atrophic vaginitis 2008  . Cataract   . Dementia ([place]) 2009  . Dyspareunia 2008  . H/O osteoporosis   . H/O varicella   . H/O vitamin D deficiency   . Headache 07/24/2016  . Heart murmur   . History of measles, mumps, or rubella   . History of radiation therapy 07/28/13- 08/10/13   right lung metastasis 5000 cGy 10 sessions  . Hypertension   . Lung cancer ([place]) dx'd 2002  . Lung cancer ([place])   . Lung cancer ([place])   . Lung metastasis ([place])    PET scan 05/05/13, RUL lung nodule  . 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 Nodule of right lung CT- 06/03/12   RIGHT UPPER LOBE  . Nodule of right lung 06/03/12   Upper Lobe  . On antineoplastic chemotherapy    TARCEVA  . Osteoporosis 2010  . Primary cancer of right upper lobe of lung ([place]) 04/22/2009   Qualifier: Diagnosis of  By: Nils Pyle CMA ([place]), Mearl Latin    . Shortness of breath    hx lung ca   . Status post chemotherapy 2003   CARBOPLATIN/PACLITAXEL /STATUS POST CLINICAL TRAIL OF CELEBREX AND IRESSA AT [facility] FOR 1 YEAR  . Status post radiation therapy 2003  LEFT LUNG  . Status post radiation therapy 11/07/2005   WHOLE BRAIN: [facility]  . Status post radiation therapy 06/02/2008   GAMMA KNIFE OF RESECTED CAVITAY  . Thyroid adenoma    ?  [name] Thyroid cancer ([place]) 10/18/11 bx   adenoid nodules   . Yeast infection    Past Surgical History:  Procedure Laterality Date  . BRAIN SURGERY    . LEFT LOWER LOBECTOMY  05/2001  . LEFT OCCIPITAL CRANIOTOMY  09/21/2005   tumor  . LUNG LOBECTOMY    . NECK SURGERY    . ORIF ANKLE FRACTURE Left 10/02/2014   Procedure: OPEN REDUCTION INTERNAL FIXATION (ORIF) ANKLE FRACTURE;  Surgeon: Alta Corning, MD;  Location: [facility];  Service: Orthopedics;  Laterality: Left;  . RADICAL NECK DISSECTION  10/18/2011    Procedure: RADICAL NECK DISSECTION;  Surgeon: Izora Gala, MD;  Location: [place];  Service: ENT;  Laterality: Right;  RIGHT MODIFIED NECK DISSECTION /POSSIBLE RIGHT THYROIDECTOM  . THYROIDECTOMY  10/18/2011   Procedure: THYROIDECTOMY WITH RADICAL NECK DISSECTION;  Surgeon: Izora Gala, MD;  Location: [place];  Service: ENT;  Laterality: Right;     Current Meds  Medication Sig  . Ascorbic Acid (VITAMIN C) 100 MG tablet Take 100 mg by mouth every evening.   . cholecalciferol (VITAMIN D) 1000 UNITS tablet Take 1,000 Units by mouth every evening.   . donepezil (ARICEPT) 10 MG tablet Take 1 tablet (10 mg total) by mouth every evening.  [name] doxycycline (VIBRAMYCIN) 100 MG capsule Take 1 capsule (100 mg total) by mouth 2 (two) times daily.  [name] loperamide (IMODIUM) 2 MG capsule Take 2 mg by mouth as needed for diarrhea or loose stools.   . Multiple Vitamin (MULTI-VITAMINS) TABS Take 1 tablet by mouth every evening.   [name] TAGRISSO 80 MG tablet TAKE 1 TABLET (80MG ) BY MOUTH DAILY.  . vitamin B-12 (CYANOCOBALAMIN) 1000 MCG tablet Take 1,000 mcg by mouth every evening.  [name] 20 MG TABS tablet Take 1 tablet (20 mg total) by mouth every evening.     Allergies:   Patient has no known allergies.   Social History   Tobacco Use  . Smoking status: Never Smoker  . Smokeless tobacco: Never Used  . Tobacco comment: smoked few years in college  Substance Use Topics  . Alcohol use: No  . Drug use: No     Family Hx: The patient's family history includes Cancer in her father, maternal aunt, and mother; Gait disorder in her mother; Heart failure in her sister.  ROS:   Please see the history of present illness.    Review of Systems  Constitutional: Negative.   Respiratory: Negative.   Cardiovascular: Negative.   Gastrointestinal: Negative.   Musculoskeletal:       Feels weak with ambulation. Does not feel steady.   Neurological: Positive for weakness.  Psychiatric/Behavioral: Negative.     All other  systems reviewed and are negative.   Labs/Other Tests and Data Reviewed:    Recent Labs: 10/16/2018: TSH 2.840 05/12/2019: ALT 7; BUN 14; Creatinine 1.07; Hemoglobin 13.3; Platelet Count 221; Potassium 3.9; Sodium 143   Recent Lipid Panel Lab Results  Component Value Date/Time   CHOL 263 (H) 10/16/2018 01:00 PM   TRIG 149 10/16/2018 01:00 PM   HDL 90 10/16/2018 01:00 PM   CHOLHDL 2.9 10/16/2018 01:00 PM   LDLCALC 143 (H) 10/16/2018 01:00 PM    Wt Readings from Last 3 Encounters:  05/12/19 171 lb  12.8 oz (77.9 kg)  03/10/19 170 lb 4.8 oz (77.2 kg)  10/16/18 173 lb 9.6 oz (78.7 kg)     Exam:    Vital Signs:  There were no vitals taken for this visit.    Physical Exam PE not performed, this is a phone visit. She is able to talk in full sentences without labored breathing.   ASSESSMENT & PLAN:     1. Dementia without behavioral disturbance, unspecified dementia type (HCC)  Chronic, yet stable. She will continue with current meds. She will rto in six months for re-evaluation.   2. Gait abnormality  She agrees to home health evaluation. I will also request HSE.   - Ambulatory referral to [facility]  3. Recurrent UTI  Her husband plans to come drop off a urine sample. She reports having urinary frequency at this time.   - POCT Urinalysis Dipstick (74163)       COVID-19 Education: The signs and symptoms of COVID-19 were discussed with the patient and how to seek care for testing (follow up with PCP or arrange E-visit).  The importance of social distancing was discussed today.  Patient Risk:   After full review of this patients clinical status, I feel that they are at least moderate risk at this time.  Time:   Today, I have spent 14 minutes/ seconds with the patient with telehealth technology discussing above diagnoses.     Medication Adjustments/Labs and Tests Ordered: Current medicines are reviewed at length with the patient today.  Concerns regarding  medicines are outlined above.   Tests Ordered: Orders Placed This Encounter  Procedures  . Ambulatory referral to Home Health  . POCT Urinalysis Dipstick (81002)    Medication Changes: No orders of the defined types were placed in this encounter.   Disposition:  Follow up prn  Signed, [name], MD

## 2019-05-19 ENCOUNTER — Telehealth: Payer: Self-pay

## 2019-05-19 NOTE — Telephone Encounter (Signed)
Left vm for pt to return call  

## 2019-05-25 ENCOUNTER — Other Ambulatory Visit (INDEPENDENT_AMBULATORY_CARE_PROVIDER_SITE_OTHER): Payer: Medicare Other

## 2019-05-25 ENCOUNTER — Telehealth: Payer: Self-pay

## 2019-05-25 ENCOUNTER — Other Ambulatory Visit: Payer: Self-pay | Admitting: Internal Medicine

## 2019-05-25 ENCOUNTER — Other Ambulatory Visit: Payer: Self-pay

## 2019-05-25 DIAGNOSIS — R269 Unspecified abnormalities of gait and mobility: Secondary | ICD-10-CM | POA: Diagnosis not present

## 2019-05-25 DIAGNOSIS — R350 Frequency of micturition: Secondary | ICD-10-CM | POA: Diagnosis not present

## 2019-05-25 DIAGNOSIS — M810 Age-related osteoporosis without current pathological fracture: Secondary | ICD-10-CM | POA: Diagnosis not present

## 2019-05-25 DIAGNOSIS — K529 Noninfective gastroenteritis and colitis, unspecified: Secondary | ICD-10-CM

## 2019-05-25 DIAGNOSIS — I10 Essential (primary) hypertension: Secondary | ICD-10-CM

## 2019-05-25 DIAGNOSIS — M6281 Muscle weakness (generalized): Secondary | ICD-10-CM

## 2019-05-25 DIAGNOSIS — F0390 Unspecified dementia without behavioral disturbance: Secondary | ICD-10-CM | POA: Diagnosis not present

## 2019-05-25 LAB — POCT URINALYSIS DIPSTICK
Bilirubin, UA: NEGATIVE
Glucose, UA: NEGATIVE
Ketones, UA: NEGATIVE
Nitrite, UA: NEGATIVE
Protein, UA: NEGATIVE
Spec Grav, UA: >=1.03 — AB (ref 1.010–1.025)
Urobilinogen, UA: 0.2 E.U./dL
pH, UA: 5 (ref 5.0–8.0)

## 2019-05-25 LAB — POCT UA - MICROALBUMIN
Albumin/Creatinine Ratio, Urine, POC: 30
Creatinine, POC: 300 mg/dL
Microalbumin Ur, POC: 30 mg/L

## 2019-05-25 NOTE — Progress Notes (Signed)
Error

## 2019-05-25 NOTE — Telephone Encounter (Signed)
Patient's husband came by the office asking if she is due for a penumonia shot. Do you mind checking to see if she is due? YRL,RMA

## 2019-05-28 LAB — URINE CULTURE

## 2019-05-28 MED FILL — TAGRISSO 80 MG TABLET: 80 | 30 days supply | Qty: 30 | Fill #1

## 2019-06-04 ENCOUNTER — Other Ambulatory Visit: Payer: Self-pay

## 2019-06-04 MED ORDER — CEPHALEXIN 500 MG PO CAPS: 500.0000 mg | ORAL_CAPSULE | Freq: Three times a day (TID) | ORAL | 0 refills | Status: DC

## 2019-06-09 NOTE — Telephone Encounter (Signed)
pls check [employer] for pneumonia vaccine history.

## 2019-06-10 ENCOUNTER — Emergency Department (HOSPITAL_COMMUNITY): Payer: Medicare Other

## 2019-06-10 ENCOUNTER — Inpatient Hospital Stay (HOSPITAL_COMMUNITY)
Admission: EM | Admit: 2019-06-10 | Discharge: 2019-06-18 | DRG: 481 | Disposition: A | Discharge status: Skilled Nursing Facility | Payer: Medicare Other | Attending: Internal Medicine | Admitting: Internal Medicine

## 2019-06-10 ENCOUNTER — Encounter (HOSPITAL_COMMUNITY): Payer: Self-pay

## 2019-06-10 ENCOUNTER — Other Ambulatory Visit: Payer: Self-pay

## 2019-06-10 DIAGNOSIS — C3411 Malignant neoplasm of upper lobe, right bronchus or lung: Secondary | ICD-10-CM | POA: Diagnosis present

## 2019-06-10 DIAGNOSIS — E559 Vitamin D deficiency, unspecified: Secondary | ICD-10-CM | POA: Diagnosis present

## 2019-06-10 DIAGNOSIS — E890 Postprocedural hypothyroidism: Secondary | ICD-10-CM | POA: Diagnosis present

## 2019-06-10 DIAGNOSIS — Z419 Encounter for procedure for purposes other than remedying health state, unspecified: Secondary | ICD-10-CM

## 2019-06-10 DIAGNOSIS — F0390 Unspecified dementia without behavioral disturbance: Secondary | ICD-10-CM | POA: Diagnosis present

## 2019-06-10 DIAGNOSIS — Z9221 Personal history of antineoplastic chemotherapy: Secondary | ICD-10-CM

## 2019-06-10 DIAGNOSIS — Z85118 Personal history of other malignant neoplasm of bronchus and lung: Secondary | ICD-10-CM

## 2019-06-10 DIAGNOSIS — Z923 Personal history of irradiation: Secondary | ICD-10-CM

## 2019-06-10 DIAGNOSIS — Y93E1 Activity, personal bathing and showering: Secondary | ICD-10-CM

## 2019-06-10 DIAGNOSIS — R059 Cough, unspecified: Secondary | ICD-10-CM

## 2019-06-10 DIAGNOSIS — W19XXXA Unspecified fall, initial encounter: Secondary | ICD-10-CM

## 2019-06-10 DIAGNOSIS — Y92002 Bathroom of unspecified non-institutional (private) residence single-family (private) house as the place of occurrence of the external cause: Secondary | ICD-10-CM

## 2019-06-10 DIAGNOSIS — K529 Noninfective gastroenteritis and colitis, unspecified: Secondary | ICD-10-CM | POA: Diagnosis present

## 2019-06-10 DIAGNOSIS — W182XXA Fall in (into) shower or empty bathtub, initial encounter: Secondary | ICD-10-CM | POA: Diagnosis present

## 2019-06-10 DIAGNOSIS — B952 Enterococcus as the cause of diseases classified elsewhere: Secondary | ICD-10-CM | POA: Diagnosis present

## 2019-06-10 DIAGNOSIS — S82121A Displaced fracture of lateral condyle of right tibia, initial encounter for closed fracture: Secondary | ICD-10-CM | POA: Diagnosis present

## 2019-06-10 DIAGNOSIS — M810 Age-related osteoporosis without current pathological fracture: Secondary | ICD-10-CM | POA: Diagnosis present

## 2019-06-10 DIAGNOSIS — N390 Urinary tract infection, site not specified: Secondary | ICD-10-CM | POA: Diagnosis present

## 2019-06-10 DIAGNOSIS — S72461A Displaced supracondylar fracture with intracondylar extension of lower end of right femur, initial encounter for closed fracture: Principal | ICD-10-CM | POA: Diagnosis present

## 2019-06-10 DIAGNOSIS — R05 Cough: Secondary | ICD-10-CM

## 2019-06-10 DIAGNOSIS — Z87440 Personal history of urinary (tract) infections: Secondary | ICD-10-CM

## 2019-06-10 DIAGNOSIS — Z902 Acquired absence of lung [part of]: Secondary | ICD-10-CM

## 2019-06-10 DIAGNOSIS — Z20828 Contact with and (suspected) exposure to other viral communicable diseases: Secondary | ICD-10-CM | POA: Diagnosis present

## 2019-06-10 DIAGNOSIS — B9620 Unspecified Escherichia coli [E. coli] as the cause of diseases classified elsewhere: Secondary | ICD-10-CM | POA: Diagnosis present

## 2019-06-10 DIAGNOSIS — Z86718 Personal history of other venous thrombosis and embolism: Secondary | ICD-10-CM

## 2019-06-10 DIAGNOSIS — S72414A Nondisplaced unspecified condyle fracture of lower end of right femur, initial encounter for closed fracture: Secondary | ICD-10-CM

## 2019-06-10 DIAGNOSIS — Z8249 Family history of ischemic heart disease and other diseases of the circulatory system: Secondary | ICD-10-CM

## 2019-06-10 DIAGNOSIS — S72401A Unspecified fracture of lower end of right femur, initial encounter for closed fracture: Secondary | ICD-10-CM | POA: Diagnosis present

## 2019-06-10 DIAGNOSIS — Z01818 Encounter for other preprocedural examination: Secondary | ICD-10-CM

## 2019-06-10 DIAGNOSIS — Z7901 Long term (current) use of anticoagulants: Secondary | ICD-10-CM

## 2019-06-10 DIAGNOSIS — D62 Acute posthemorrhagic anemia: Secondary | ICD-10-CM

## 2019-06-10 DIAGNOSIS — S82141A Displaced bicondylar fracture of right tibia, initial encounter for closed fracture: Secondary | ICD-10-CM

## 2019-06-10 DIAGNOSIS — M1990 Unspecified osteoarthritis, unspecified site: Secondary | ICD-10-CM | POA: Diagnosis present

## 2019-06-10 DIAGNOSIS — C7931 Secondary malignant neoplasm of brain: Secondary | ICD-10-CM | POA: Diagnosis present

## 2019-06-10 DIAGNOSIS — C779 Secondary and unspecified malignant neoplasm of lymph node, unspecified: Secondary | ICD-10-CM | POA: Diagnosis present

## 2019-06-10 NOTE — ED Provider Notes (Signed)
[facility] DEPT Provider Note   CSN: [id number] Arrival date & time: 06/10/19  2256     History   Chief Complaint Chief Complaint  Patient presents with  . Fall    HPI Valerie [name] is a 73 y.o. female.     The history is provided by the patient.  Fall This is a new problem. The current episode started less than 1 hour ago. The problem occurs constantly. The problem has not changed since onset.Pertinent negatives include no chest pain, no abdominal pain, no headaches and no shortness of breath. Nothing aggravates the symptoms. Nothing relieves the symptoms. She has tried nothing for the symptoms. The treatment provided no relief.    Past Medical History:  Diagnosis Date  . Abnormal Pap smear 2006  . Anal fistula   . Ankle fracture   . Anxiety   . Arthritis   . Atrophic vaginitis 2008  . Cataract   . Dementia ([place]) 2009  . Dyspareunia 2008  . H/O osteoporosis   . H/O varicella   . H/O vitamin D deficiency   . Headache 07/24/2016  . Heart murmur   . History of measles, mumps, or rubella   . History of radiation therapy 07/28/13- 08/10/13   right lung metastasis 5000 cGy 10 sessions  . Hypertension   . Lung cancer ([place]) dx'd 2002  . Lung cancer ([place])   . Lung cancer ([place])   . Lung metastasis ([place])    PET scan 05/05/13, RUL lung nodule  . 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 Nodule of right lung CT- 06/03/12   RIGHT UPPER LOBE  . Nodule of right lung 06/03/12   Upper Lobe  . On antineoplastic chemotherapy    TARCEVA  . Osteoporosis 2010  . Primary cancer of right upper lobe of lung ([place]) 04/22/2009   Qualifier: Diagnosis of  By: Nils Pyle CMA ([place]), Mearl Latin    . Shortness of breath    hx lung ca   . Status post chemotherapy 2003   CARBOPLATIN/PACLITAXEL /STATUS POST CLINICAL TRAIL OF CELEBREX AND IRESSA AT [facility] FOR 1 YEAR  . Status post radiation therapy 2003   LEFT LUNG  .  Status post radiation therapy 11/07/2005   WHOLE BRAIN: [facility]  . Status post radiation therapy 06/02/2008   GAMMA KNIFE OF RESECTED CAVITAY  . Thyroid adenoma    ?  [name] Thyroid cancer ([place]) 10/18/11 bx   adenoid nodules   . Yeast infection     Patient Active Problem List   Diagnosis Date Noted  . Blurry vision 01/06/2019  . Headache 07/24/2016  . Chronic diarrhea 08/24/2015  . Sepsis ([place])   . Encounter for antineoplastic chemotherapy 01/31/2015  . Urinary retention 10/20/2014  . Ileus ([place]) 10/20/2014  . Cellulitis of leg, right 10/20/2014  . Fracture of right proximal fibula   . Ankle fracture 10/02/2014  . Ankle fracture 10/02/2014  . Lung cancer ([place]) 10/02/2014  . Closed fibular fracture 10/02/2014  . Dementia ([place]) 10/02/2014  . UTI (lower urinary tract infection) 10/02/2014  . Reactive airway disease 10/02/2014  . Fall 10/02/2014  . Closed left ankle fracture 10/01/2014  . Right fibular fracture 10/01/2014  . Lung metastasis ([place])   . LESION, ANUS 04/26/2009  . DIARRHEA 04/26/2009  . Primary cancer of right upper lobe of lung ([place]) 04/22/2009  . Malignant neoplasm of brain ([place]) 04/22/2009  . VITAMIN D DEFICIENCY  04/22/2009  . Dementia ([place]) 04/22/2009  . ANAL FISTULA 04/22/2009  . OSTEOPOROSIS 04/22/2009    Past Surgical History:  Procedure Laterality Date  . BRAIN SURGERY    . LEFT LOWER LOBECTOMY  05/2001  . LEFT OCCIPITAL CRANIOTOMY  09/21/2005   tumor  . LUNG LOBECTOMY    . NECK SURGERY    . ORIF ANKLE FRACTURE Left 10/02/2014   Procedure: OPEN REDUCTION INTERNAL FIXATION (ORIF) ANKLE FRACTURE;  Surgeon: Alta Corning, MD;  [facility];  Service: Orthopedics;  Laterality: Left;  . RADICAL NECK DISSECTION  10/18/2011   Procedure: RADICAL NECK DISSECTION;  Surgeon: Izora Gala, MD;  Location: [facility];  Service: ENT;  Laterality: Right;  RIGHT MODIFIED NECK DISSECTION /POSSIBLE RIGHT THYROIDECTOM  . THYROIDECTOMY  10/18/2011   Procedure: THYROIDECTOMY  WITH RADICAL NECK DISSECTION;  Surgeon: Izora Gala, MD;  Location[facility];  Service: ENT;  Laterality: Right;     OB History    Gravida  2   Para  0   Term  0   Preterm  0   AB  0   Living  2     SAB  0   TAB  0   Ectopic  0   Multiple      Live Births  2            Home Medications    Prior to Admission medications   Medication Sig Start Date End Date Taking? Authorizing Provider  Ascorbic Acid (VITAMIN C) 100 MG tablet Take 100 mg by mouth every evening.     [provider]  cephALEXin (KEFLEX) 500 MG capsule Take 1 capsule (500 mg total) by mouth 3 (three) times daily. 06/04/19   [name], MD  cholecalciferol (VITAMIN D) 1000 UNITS tablet Take 1,000 Units by mouth every evening.     [provider]  donepezil (ARICEPT) 10 MG tablet Take 1 tablet (10 mg total) by mouth every evening. 01/13/19   [name], MD  doxycycline (VIBRAMYCIN) 100 MG capsule Take 1 capsule (100 mg total) by mouth 2 (two) times daily. 03/23/19   [name], MD  loperamide (IMODIUM) 2 MG capsule Take 2 mg by mouth as needed for diarrhea or loose stools.     [provider]  Multiple Vitamin (MULTI-VITAMINS) TABS Take 1 tablet by mouth every evening.     [provider]  TAGRISSO 80 MG tablet TAKE 1 TABLET (80MG ) BY MOUTH DAILY. 04/30/19   Curt Bears, MD  vitamin B-12 (CYANOCOBALAMIN) 1000 MCG tablet Take 1,000 mcg by mouth every evening.    [provider]  XARELTO 20 MG TABS tablet Take 1 tablet (20 mg total) by mouth every evening. 12/18/18   [name], MD    Family History Family History  Problem Relation Age of Onset  . Gait disorder Mother   . Cancer Mother        Meningioma  . Cancer Father        Pancreatic  . Heart failure Sister   . Cancer Maternal Aunt        menigeoma    Social History Social History   Tobacco Use  . Smoking status: Never Smoker  . Smokeless tobacco: Never Used  . Tobacco comment:  smoked few years in college  Substance Use Topics  . Alcohol use: No  . Drug use: No     Allergies   Patient has no known allergies.   Review of Systems Review of  Systems  Constitutional: Negative for fever.  HENT: Negative for congestion.   Eyes: Negative for visual disturbance.  Respiratory: Negative for shortness of breath.   Cardiovascular: Negative for chest pain.  Gastrointestinal: Negative for abdominal pain.  Genitourinary: Negative for difficulty urinating.  Musculoskeletal: Positive for arthralgias.  Skin: Negative for wound.  Neurological: Negative for headaches.  Psychiatric/Behavioral: Negative for agitation.  All other systems reviewed and are negative.    Physical Exam Updated Vital Signs BP 126/65 (BP Location: Right Arm)   Pulse 78   Temp 98.3 F (36.8 C) (Oral)   Resp 19   Ht 5\' 8"  (1.727 m)   Wt 78.5 kg   SpO2 97%   BMI 26.30 kg/m   Physical Exam Vitals signs and nursing note reviewed.  Constitutional:      General: She is not in acute distress.    Appearance: She is normal weight.  HENT:     Head: Normocephalic and atraumatic.     Nose: Nose normal.  Eyes:     Conjunctiva/sclera: Conjunctivae normal.     Pupils: Pupils are equal, round, and reactive to light.  Neck:     Musculoskeletal: Normal range of motion.  Cardiovascular:     Rate and Rhythm: Normal rate and regular rhythm.     Pulses: Normal pulses.     Heart sounds: Normal heart sounds.  Pulmonary:     Effort: Pulmonary effort is normal.  Abdominal:     General: Abdomen is flat. Bowel sounds are normal.     Tenderness: There is no abdominal tenderness. There is no guarding or rebound.  Musculoskeletal:     Right wrist: Normal.     Right knee: Normal.     Right ankle: Normal. Achilles tendon normal.     Cervical back: Normal.     Thoracic back: Normal.     Lumbar back: Normal.     Right hand: Normal. She exhibits normal capillary refill. Normal sensation noted. Normal  strength noted.     Right foot: Normal.  Skin:    General: Skin is warm and dry.     Capillary Refill: Capillary refill takes less than 2 seconds.  Neurological:     General: No focal deficit present.     Mental Status: She is alert.  Psychiatric:        Mood and Affect: Mood normal.        Behavior: Behavior normal.      ED Treatments / Results  Labs (all labs ordered are listed, but only abnormal results are displayed) Results for orders placed or performed during the hospital encounter of 06/10/19  CBC with Differential/Platelet  Result Value Ref Range   WBC 7.2 4.0 - 10.5 K/uL   RBC 3.82 (L) 3.87 - 5.11 MIL/uL   Hemoglobin 11.3 (L) 12.0 - 15.0 g/dL   HCT 37.1 36.0 - 46.0 %   MCV 97.1 80.0 - 100.0 fL   MCH 29.6 26.0 - 34.0 pg   MCHC 30.5 30.0 - 36.0 g/dL   RDW 13.5 11.5 - 15.5 %   Platelets 193 150 - 400 K/uL   nRBC 0.0 0.0 - 0.2 %   Neutrophils Relative % 83 %   Neutro Abs 5.9 1.7 - 7.7 K/uL   Lymphocytes Relative 8 %   Lymphs Abs 0.6 (L) 0.7 - 4.0 K/uL   Monocytes Relative 8 %   Monocytes Absolute 0.6 0.1 - 1.0 K/uL   Eosinophils Relative 1 %   Eosinophils Absolute 0.1  0.0 - 0.5 K/uL   Basophils Relative 0 %   Basophils Absolute 0.0 0.0 - 0.1 K/uL   Immature Granulocytes 0 %   Abs Immature Granulocytes 0.03 0.00 - 0.07 K/uL  I-stat chem 8, ED (not at [facility] or [facility])  Result Value Ref Range   Sodium 141 135 - 145 mmol/L   Potassium 3.6 3.5 - 5.1 mmol/L   Chloride 107 98 - 111 mmol/L   BUN 20 8 - 23 mg/dL   Creatinine, Ser 1.00 0.44 - 1.00 mg/dL   Glucose, Bld 127 (H) 70 - 99 mg/dL   Calcium, Ion 1.29 1.15 - 1.40 mmol/L   TCO2 26 22 - 32 mmol/L   Hemoglobin 11.9 (L) 12.0 - 15.0 g/dL   HCT 35.0 (L) 36.0 - 46.0 %   Dg Tibia/fibula Right  Result Date: 06/11/2019 CLINICAL DATA:  Pain after fall EXAM: RIGHT TIBIA AND FIBULA - 2 VIEW COMPARISON:  None. FINDINGS: Comminuted impacted intra-articular fracture of the intracondylar notch and medial femoral condyle.  There is also comminuted mildly impacted intra-articular fracture of the lateral tibial plateau. There is diffuse osteopenia. Prepatellar subcutaneous edema. IMPRESSION: Comminuted fractures of the distal femur and lateral tibial plateau. Prepatellar subcutaneous edema. Diffuse osteopenia. Electronically Signed   By: Prudencio Pair M.D.   On: 06/11/2019 00:18   Dg Knee Complete 4 Views Right  Result Date: 06/11/2019 CLINICAL DATA:  Knee pain EXAM: RIGHT KNEE - COMPLETE 4+ VIEW COMPARISON:  None. FINDINGS: There is comminuted slightly impacted fracture seen through the intracondylar notch extending through the medial femoral condyle and medial metadiaphysis. There is also intra-articular mildly comminuted impacted fracture seen through the lateral tibial plateau. A small lipohemarthrosis is seen. Prepatellar subcutaneous edema is noted. IMPRESSION: 1. Comminuted impacted intra-articular fracture through the intracondylar notch extending through the medial femoral condyle and femoral shaft. 2. Comminuted mildly impacted intra-articular fracture of the lateral tibial plateau. 3. Small lipohemarthrosis Electronically Signed   By: Prudencio Pair M.D.   On: 06/11/2019 00:13   Dg Hip Unilat W Or Wo Pelvis 2-3 Views Right  Result Date: 06/11/2019 CLINICAL DATA:  Hip pain after fall EXAM: DG HIP (WITH OR WITHOUT PELVIS) 2-3V RIGHT COMPARISON:  None. FINDINGS: There is no evidence of hip fracture or dislocation. 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 IMPRESSION: No acute osseous abnormality. Electronically Signed   By: Prudencio Pair M.D.   On: 06/11/2019 00:12    Procedures Procedures (including critical care time)  Medications Ordered in ED Medications  fentaNYL (SUBLIMAZE) injection 100 mcg (100 mcg Intravenous Given 06/11/19 0047)    Per Dr. Percell Miller, admit at [facility].  Please keep NPO and get CT knee  Final Clinical Impressions(s) / ED Diagnoses   NPO  admit to medicine with orthopedics consult.     Palumbo, April, MD 06/11/19 0120

## 2019-06-10 NOTE — ED Triage Notes (Addendum)
Per ems: Pt coming from home c/o right knee pain with slight deformity after falling tonight. No LOC. Pt is on blood thinner and requested this facility due to hx of cancer. No neck, head or back pain.   12mcg fent given by ems   20 L wrist   126/80 90 hr 18 rr 97 % room air

## 2019-06-10 NOTE — ED Notes (Signed)
Patient transported to X-ray 

## 2019-06-11 ENCOUNTER — Encounter (HOSPITAL_COMMUNITY): Payer: Self-pay | Admitting: Family Medicine

## 2019-06-11 ENCOUNTER — Inpatient Hospital Stay (HOSPITAL_COMMUNITY): Payer: Medicare Other

## 2019-06-11 ENCOUNTER — Encounter (HOSPITAL_COMMUNITY)
Admission: EM | Disposition: A | Discharge status: Skilled Nursing Facility | Payer: Self-pay | Source: Home / Self Care | Attending: Internal Medicine

## 2019-06-11 ENCOUNTER — Inpatient Hospital Stay (HOSPITAL_COMMUNITY): Payer: Medicare Other | Admitting: Certified Registered"

## 2019-06-11 DIAGNOSIS — M1990 Unspecified osteoarthritis, unspecified site: Secondary | ICD-10-CM | POA: Diagnosis present

## 2019-06-11 DIAGNOSIS — S82121A Displaced fracture of lateral condyle of right tibia, initial encounter for closed fracture: Secondary | ICD-10-CM | POA: Diagnosis not present

## 2019-06-11 DIAGNOSIS — S72461A Displaced supracondylar fracture with intracondylar extension of lower end of right femur, initial encounter for closed fracture: Secondary | ICD-10-CM | POA: Diagnosis present

## 2019-06-11 DIAGNOSIS — C7931 Secondary malignant neoplasm of brain: Secondary | ICD-10-CM | POA: Diagnosis present

## 2019-06-11 DIAGNOSIS — D62 Acute posthemorrhagic anemia: Secondary | ICD-10-CM | POA: Diagnosis not present

## 2019-06-11 DIAGNOSIS — M810 Age-related osteoporosis without current pathological fracture: Secondary | ICD-10-CM | POA: Diagnosis present

## 2019-06-11 DIAGNOSIS — Z8249 Family history of ischemic heart disease and other diseases of the circulatory system: Secondary | ICD-10-CM | POA: Diagnosis not present

## 2019-06-11 DIAGNOSIS — Z86718 Personal history of other venous thrombosis and embolism: Secondary | ICD-10-CM | POA: Diagnosis not present

## 2019-06-11 DIAGNOSIS — Z87440 Personal history of urinary (tract) infections: Secondary | ICD-10-CM | POA: Diagnosis not present

## 2019-06-11 DIAGNOSIS — Z7901 Long term (current) use of anticoagulants: Secondary | ICD-10-CM | POA: Diagnosis not present

## 2019-06-11 DIAGNOSIS — B9620 Unspecified Escherichia coli [E. coli] as the cause of diseases classified elsewhere: Secondary | ICD-10-CM | POA: Diagnosis present

## 2019-06-11 DIAGNOSIS — C3411 Malignant neoplasm of upper lobe, right bronchus or lung: Secondary | ICD-10-CM | POA: Diagnosis not present

## 2019-06-11 DIAGNOSIS — C779 Secondary and unspecified malignant neoplasm of lymph node, unspecified: Secondary | ICD-10-CM | POA: Diagnosis present

## 2019-06-11 DIAGNOSIS — F0390 Unspecified dementia without behavioral disturbance: Secondary | ICD-10-CM

## 2019-06-11 DIAGNOSIS — Z20828 Contact with and (suspected) exposure to other viral communicable diseases: Secondary | ICD-10-CM | POA: Diagnosis present

## 2019-06-11 DIAGNOSIS — S82141A Displaced bicondylar fracture of right tibia, initial encounter for closed fracture: Secondary | ICD-10-CM | POA: Diagnosis present

## 2019-06-11 DIAGNOSIS — Z85118 Personal history of other malignant neoplasm of bronchus and lung: Secondary | ICD-10-CM | POA: Diagnosis not present

## 2019-06-11 DIAGNOSIS — W182XXA Fall in (into) shower or empty bathtub, initial encounter: Secondary | ICD-10-CM | POA: Diagnosis present

## 2019-06-11 DIAGNOSIS — Y93E1 Activity, personal bathing and showering: Secondary | ICD-10-CM | POA: Diagnosis not present

## 2019-06-11 DIAGNOSIS — S72401A Unspecified fracture of lower end of right femur, initial encounter for closed fracture: Secondary | ICD-10-CM | POA: Diagnosis not present

## 2019-06-11 DIAGNOSIS — Z902 Acquired absence of lung [part of]: Secondary | ICD-10-CM | POA: Diagnosis not present

## 2019-06-11 DIAGNOSIS — E890 Postprocedural hypothyroidism: Secondary | ICD-10-CM | POA: Diagnosis present

## 2019-06-11 DIAGNOSIS — B952 Enterococcus as the cause of diseases classified elsewhere: Secondary | ICD-10-CM | POA: Diagnosis present

## 2019-06-11 DIAGNOSIS — S82121D Displaced fracture of lateral condyle of right tibia, subsequent encounter for closed fracture with routine healing: Secondary | ICD-10-CM | POA: Diagnosis not present

## 2019-06-11 DIAGNOSIS — N390 Urinary tract infection, site not specified: Secondary | ICD-10-CM | POA: Diagnosis present

## 2019-06-11 DIAGNOSIS — R05 Cough: Secondary | ICD-10-CM | POA: Diagnosis not present

## 2019-06-11 DIAGNOSIS — Y92002 Bathroom of unspecified non-institutional (private) residence single-family (private) house as the place of occurrence of the external cause: Secondary | ICD-10-CM | POA: Diagnosis not present

## 2019-06-11 DIAGNOSIS — Z9221 Personal history of antineoplastic chemotherapy: Secondary | ICD-10-CM | POA: Diagnosis not present

## 2019-06-11 DIAGNOSIS — Z923 Personal history of irradiation: Secondary | ICD-10-CM | POA: Diagnosis not present

## 2019-06-11 DIAGNOSIS — K529 Noninfective gastroenteritis and colitis, unspecified: Secondary | ICD-10-CM | POA: Diagnosis present

## 2019-06-11 DIAGNOSIS — E559 Vitamin D deficiency, unspecified: Secondary | ICD-10-CM | POA: Diagnosis present

## 2019-06-11 HISTORY — PX: ORIF TIBIA PLATEAU: SHX2132

## 2019-06-11 HISTORY — PX: ORIF FEMUR FRACTURE: SHX2119

## 2019-06-11 LAB — TYPE AND SCREEN
ABO/RH(D): A NEG
ABO/RH(D): A NEG
Antibody Screen: NEGATIVE
Antibody Screen: NEGATIVE

## 2019-06-11 LAB — CBC WITH DIFFERENTIAL/PLATELET
Abs Immature Granulocytes: 0.03 10*3/uL (ref 0.00–0.07)
Basophils Absolute: 0 10*3/uL (ref 0.0–0.1)
Basophils Relative: 0 %
Eosinophils Absolute: 0.1 10*3/uL (ref 0.0–0.5)
Eosinophils Relative: 1 %
HCT: 37.1 % (ref 36.0–46.0)
Hemoglobin: 11.3 g/dL — ABNORMAL LOW (ref 12.0–15.0)
Immature Granulocytes: 0 %
Lymphocytes Relative: 8 %
Lymphs Abs: 0.6 10*3/uL — ABNORMAL LOW (ref 0.7–4.0)
MCH: 29.6 pg (ref 26.0–34.0)
MCHC: 30.5 g/dL (ref 30.0–36.0)
MCV: 97.1 fL (ref 80.0–100.0)
Monocytes Absolute: 0.6 10*3/uL (ref 0.1–1.0)
Monocytes Relative: 8 %
Neutro Abs: 5.9 10*3/uL (ref 1.7–7.7)
Neutrophils Relative %: 83 %
Platelets: 193 10*3/uL (ref 150–400)
RBC: 3.82 MIL/uL — ABNORMAL LOW (ref 3.87–5.11)
RDW: 13.5 % (ref 11.5–15.5)
WBC: 7.2 10*3/uL (ref 4.0–10.5)
nRBC: 0 % (ref 0.0–0.2)

## 2019-06-11 LAB — CBC
HCT: 33.1 % — ABNORMAL LOW (ref 36.0–46.0)
Hemoglobin: 10.4 g/dL — ABNORMAL LOW (ref 12.0–15.0)
MCH: 30.5 pg (ref 26.0–34.0)
MCHC: 31.4 g/dL (ref 30.0–36.0)
MCV: 97.1 fL (ref 80.0–100.0)
Platelets: 163 10*3/uL (ref 150–400)
RBC: 3.41 MIL/uL — ABNORMAL LOW (ref 3.87–5.11)
RDW: 13.6 % (ref 11.5–15.5)
WBC: 8.3 10*3/uL (ref 4.0–10.5)
nRBC: 0 % (ref 0.0–0.2)

## 2019-06-11 LAB — I-STAT CHEM 8, ED
BUN: 20 mg/dL (ref 8–23)
Calcium, Ion: 1.29 mmol/L (ref 1.15–1.40)
Chloride: 107 mmol/L (ref 98–111)
Creatinine, Ser: 1 mg/dL (ref 0.44–1.00)
Glucose, Bld: 127 mg/dL — ABNORMAL HIGH (ref 70–99)
HCT: 35 % — ABNORMAL LOW (ref 36.0–46.0)
Hemoglobin: 11.9 g/dL — ABNORMAL LOW (ref 12.0–15.0)
Potassium: 3.6 mmol/L (ref 3.5–5.1)
Sodium: 141 mmol/L (ref 135–145)
TCO2: 26 mmol/L (ref 22–32)

## 2019-06-11 LAB — BASIC METABOLIC PANEL
Anion gap: 8 (ref 5–15)
BUN: 19 mg/dL (ref 8–23)
CO2: 24 mmol/L (ref 22–32)
Calcium: 9.4 mg/dL (ref 8.9–10.3)
Chloride: 107 mmol/L (ref 98–111)
Creatinine, Ser: 0.96 mg/dL (ref 0.44–1.00)
GFR calc Af Amer: >60 mL/min (ref >60)
GFR calc non Af Amer: 59 mL/min — ABNORMAL LOW (ref >60)
Glucose, Bld: 137 mg/dL — ABNORMAL HIGH (ref 70–99)
Potassium: 4.2 mmol/L (ref 3.5–5.1)
Sodium: 139 mmol/L (ref 135–145)

## 2019-06-11 LAB — ABO/RH: ABO/RH(D): A NEG

## 2019-06-11 LAB — SARS CORONAVIRUS 2 BY RT PCR (HOSPITAL ORDER, PERFORMED IN ~~LOC~~ HOSPITAL LAB): SARS Coronavirus 2: NEGATIVE

## 2019-06-11 SURGERY — OPEN REDUCTION INTERNAL FIXATION (ORIF) DISTAL FEMUR FRACTURE
Anesthesia: General | Site: Leg Upper | Laterality: Right

## 2019-06-11 MED ORDER — POLYETHYLENE GLYCOL 3350 17 G PO PACK: 17.0000 g | PACK | Freq: Every day | ORAL | Status: DC | PRN

## 2019-06-11 MED ORDER — CEFAZOLIN SODIUM-DEXTROSE 2-4 GM/100ML-% IV SOLN
2.0000 g | INTRAVENOUS | Status: AC
  Administered 2019-06-11: 11:00:00 2 g via INTRAVENOUS

## 2019-06-11 MED ORDER — PHENYLEPHRINE 40 MCG/ML (10ML) SYRINGE FOR IV PUSH (FOR BLOOD PRESSURE SUPPORT)
PREFILLED_SYRINGE | INTRAVENOUS | Status: DC | PRN
  Administered 2019-06-11 (×5): 80 ug via INTRAVENOUS

## 2019-06-11 MED ORDER — SUGAMMADEX SODIUM 200 MG/2ML IV SOLN
INTRAVENOUS | Status: DC | PRN
  Administered 2019-06-11: 160 mg via INTRAVENOUS

## 2019-06-11 MED ORDER — FENTANYL CITRATE (PF) 250 MCG/5ML IJ SOLN
INTRAMUSCULAR | Status: AC
  Filled 2019-06-11: qty 5

## 2019-06-11 MED ORDER — SODIUM CHLORIDE 0.9 % IR SOLN
Status: DC | PRN
  Administered 2019-06-11 (×2): 1000 mL

## 2019-06-11 MED ORDER — METHOCARBAMOL 500 MG PO TABS
500.0000 mg | ORAL_TABLET | Freq: Four times a day (QID) | ORAL | Status: DC | PRN
  Administered 2019-06-16 (×2): 500 mg via ORAL
  Filled 2019-06-11 (×2): qty 1

## 2019-06-11 MED ORDER — ONDANSETRON HCL 4 MG/2ML IJ SOLN
4.0000 mg | Freq: Once | INTRAMUSCULAR | Status: AC | PRN
  Administered 2019-06-11: 15:00:00 4 mg via INTRAVENOUS

## 2019-06-11 MED ORDER — SODIUM CHLORIDE 0.9 % IV SOLN
INTRAVENOUS | Status: AC
  Administered 2019-06-11: 07:00:00 via INTRAVENOUS

## 2019-06-11 MED ORDER — FENTANYL CITRATE (PF) 100 MCG/2ML IJ SOLN: 25.0000 ug | INTRAMUSCULAR | Status: DC | PRN

## 2019-06-11 MED ORDER — ONDANSETRON HCL 4 MG/2ML IJ SOLN
INTRAMUSCULAR | Status: AC
  Administered 2019-06-11: 4 mg via INTRAVENOUS
  Filled 2019-06-11: qty 2

## 2019-06-11 MED ORDER — OSIMERTINIB MESYLATE 80 MG PO TABS: 80.0000 mg | ORAL_TABLET | Freq: Every day | ORAL | Status: DC

## 2019-06-11 MED ORDER — FENTANYL CITRATE (PF) 100 MCG/2ML IJ SOLN
25.0000 ug | INTRAMUSCULAR | Status: DC | PRN
  Administered 2019-06-11 (×2): 25 ug via INTRAVENOUS

## 2019-06-11 MED ORDER — DONEPEZIL HCL 10 MG PO TABS
10.0000 mg | ORAL_TABLET | Freq: Every evening | ORAL | Status: DC
  Administered 2019-06-11 – 2019-06-17 (×7): 10 mg via ORAL
  Filled 2019-06-11 (×7): qty 1

## 2019-06-11 MED ORDER — ONDANSETRON HCL 4 MG PO TABS
4.0000 mg | ORAL_TABLET | Freq: Four times a day (QID) | ORAL | Status: DC | PRN
  Administered 2019-06-14: 4 mg via ORAL
  Filled 2019-06-11: qty 1

## 2019-06-11 MED ORDER — ROCURONIUM BROMIDE 10 MG/ML (PF) SYRINGE
PREFILLED_SYRINGE | INTRAVENOUS | Status: AC
  Filled 2019-06-11: qty 10

## 2019-06-11 MED ORDER — METHOCARBAMOL 1000 MG/10ML IJ SOLN
500.0000 mg | Freq: Four times a day (QID) | INTRAVENOUS | Status: DC | PRN
  Filled 2019-06-11: qty 5

## 2019-06-11 MED ORDER — FENTANYL CITRATE (PF) 100 MCG/2ML IJ SOLN
100.0000 ug | Freq: Once | INTRAMUSCULAR | Status: AC
  Administered 2019-06-11: 100 ug via INTRAVENOUS
  Filled 2019-06-11: qty 2

## 2019-06-11 MED ORDER — ONDANSETRON HCL 4 MG/2ML IJ SOLN
4.0000 mg | Freq: Four times a day (QID) | INTRAMUSCULAR | Status: DC | PRN
  Administered 2019-06-12: 4 mg via INTRAVENOUS
  Filled 2019-06-11 (×2): qty 2

## 2019-06-11 MED ORDER — OXYCODONE HCL 5 MG PO TABS: 5.0000 mg | ORAL_TABLET | Freq: Once | ORAL | Status: DC | PRN

## 2019-06-11 MED ORDER — LACTATED RINGERS IV SOLN
INTRAVENOUS | Status: DC
  Administered 2019-06-11 (×2): via INTRAVENOUS

## 2019-06-11 MED ORDER — MORPHINE SULFATE (PF) 2 MG/ML IV SOLN: 0.5000 mg | INTRAVENOUS | Status: DC | PRN

## 2019-06-11 MED ORDER — SENNOSIDES-DOCUSATE SODIUM 8.6-50 MG PO TABS: 1.0000 | ORAL_TABLET | Freq: Every evening | ORAL | Status: DC | PRN

## 2019-06-11 MED ORDER — TRANEXAMIC ACID-NACL 1000-0.7 MG/100ML-% IV SOLN
INTRAVENOUS | Status: AC
  Filled 2019-06-11: qty 100

## 2019-06-11 MED ORDER — LIDOCAINE 2% (20 MG/ML) 5 ML SYRINGE
INTRAMUSCULAR | Status: AC
  Filled 2019-06-11: qty 5

## 2019-06-11 MED ORDER — HYDROCODONE-ACETAMINOPHEN 5-325 MG PO TABS: 1.0000 | ORAL_TABLET | Freq: Four times a day (QID) | ORAL | Status: DC | PRN

## 2019-06-11 MED ORDER — ONDANSETRON HCL 4 MG/2ML IJ SOLN
INTRAMUSCULAR | Status: AC
  Filled 2019-06-11: qty 2

## 2019-06-11 MED ORDER — METOCLOPRAMIDE HCL 5 MG/ML IJ SOLN
5.0000 mg | Freq: Three times a day (TID) | INTRAMUSCULAR | Status: DC | PRN
  Administered 2019-06-12: 10 mg via INTRAVENOUS
  Filled 2019-06-11: qty 2

## 2019-06-11 MED ORDER — ACETAMINOPHEN 325 MG PO TABS
325.0000 mg | ORAL_TABLET | Freq: Four times a day (QID) | ORAL | Status: DC | PRN
  Administered 2019-06-12 – 2019-06-14 (×2): 650 mg via ORAL
  Filled 2019-06-11 (×2): qty 2

## 2019-06-11 MED ORDER — FENTANYL CITRATE (PF) 250 MCG/5ML IJ SOLN
INTRAMUSCULAR | Status: DC | PRN
  Administered 2019-06-11 (×7): 50 ug via INTRAVENOUS

## 2019-06-11 MED ORDER — LIDOCAINE 2% (20 MG/ML) 5 ML SYRINGE
INTRAMUSCULAR | Status: DC | PRN
  Administered 2019-06-11: 60 mg via INTRAVENOUS

## 2019-06-11 MED ORDER — OSIMERTINIB MESYLATE 80 MG PO TABS
80.0000 mg | ORAL_TABLET | Freq: Every day | ORAL | Status: DC
  Administered 2019-06-11 – 2019-06-18 (×8): 80 mg via ORAL
  Filled 2019-06-11 (×8): qty 1

## 2019-06-11 MED ORDER — ONDANSETRON HCL 4 MG/2ML IJ SOLN
INTRAMUSCULAR | Status: DC | PRN
  Administered 2019-06-11: 4 mg via INTRAVENOUS

## 2019-06-11 MED ORDER — BISACODYL 10 MG RE SUPP: 10.0000 mg | Freq: Every day | RECTAL | Status: DC | PRN

## 2019-06-11 MED ORDER — POVIDONE-IODINE 10 % EX SWAB: 2.0000 "application " | Freq: Once | CUTANEOUS | Status: DC

## 2019-06-11 MED ORDER — PROPOFOL 10 MG/ML IV BOLUS
INTRAVENOUS | Status: DC | PRN
  Administered 2019-06-11: 90 mg via INTRAVENOUS

## 2019-06-11 MED ORDER — SODIUM CHLORIDE 0.9 % IV SOLN
INTRAVENOUS | Status: DC | PRN
  Administered 2019-06-11: 40 ug/min via INTRAVENOUS

## 2019-06-11 MED ORDER — RIVAROXABAN 20 MG PO TABS
20.0000 mg | ORAL_TABLET | Freq: Every evening | ORAL | Status: DC
  Administered 2019-06-12 – 2019-06-17 (×6): 20 mg via ORAL
  Filled 2019-06-11 (×6): qty 1

## 2019-06-11 MED ORDER — CEPHALEXIN 500 MG PO CAPS
500.0000 mg | ORAL_CAPSULE | Freq: Three times a day (TID) | ORAL | Status: DC
  Administered 2019-06-11 – 2019-06-16 (×15): 500 mg via ORAL
  Filled 2019-06-11 (×15): qty 1

## 2019-06-11 MED ORDER — MEPERIDINE HCL 25 MG/ML IJ SOLN
6.2500 mg | INTRAMUSCULAR | Status: DC | PRN
  Administered 2019-06-11: 6.25 mg via INTRAVENOUS

## 2019-06-11 MED ORDER — METOCLOPRAMIDE HCL 5 MG PO TABS: 5.0000 mg | ORAL_TABLET | Freq: Three times a day (TID) | ORAL | Status: DC | PRN

## 2019-06-11 MED ORDER — LACTATED RINGERS IV SOLN
INTRAVENOUS | Status: DC | PRN
  Administered 2019-06-11: 11:00:00 via INTRAVENOUS

## 2019-06-11 MED ORDER — MEPERIDINE HCL 25 MG/ML IJ SOLN
INTRAMUSCULAR | Status: AC
  Filled 2019-06-11: qty 1

## 2019-06-11 MED ORDER — DOCUSATE SODIUM 100 MG PO CAPS
100.0000 mg | ORAL_CAPSULE | Freq: Two times a day (BID) | ORAL | Status: DC
  Administered 2019-06-11 – 2019-06-15 (×5): 100 mg via ORAL
  Filled 2019-06-11 (×7): qty 1

## 2019-06-11 MED ORDER — MORPHINE SULFATE (PF) 2 MG/ML IV SOLN: 1.0000 mg | INTRAVENOUS | Status: DC | PRN

## 2019-06-11 MED ORDER — TRANEXAMIC ACID 1000 MG/10ML IV SOLN
INTRAVENOUS | Status: DC | PRN
  Administered 2019-06-11: 1000 mg via INTRAVENOUS

## 2019-06-11 MED ORDER — OXYCODONE HCL 5 MG/5ML PO SOLN: 5.0000 mg | Freq: Once | ORAL | Status: DC | PRN

## 2019-06-11 MED ORDER — ACETAMINOPHEN 160 MG/5ML PO SOLN: 325.0000 mg | ORAL | Status: DC | PRN

## 2019-06-11 MED ORDER — HYDROCODONE-ACETAMINOPHEN 5-325 MG PO TABS: 1.0000 | ORAL_TABLET | Freq: Four times a day (QID) | ORAL | 0 refills | Status: AC | PRN

## 2019-06-11 MED ORDER — SUCCINYLCHOLINE CHLORIDE 20 MG/ML IJ SOLN
INTRAMUSCULAR | Status: DC | PRN
  Administered 2019-06-11: 120 mg via INTRAVENOUS

## 2019-06-11 MED ORDER — ROCURONIUM BROMIDE 10 MG/ML (PF) SYRINGE
PREFILLED_SYRINGE | INTRAVENOUS | Status: DC | PRN
  Administered 2019-06-11: 45 mg via INTRAVENOUS
  Administered 2019-06-11: 20 mg via INTRAVENOUS
  Administered 2019-06-11: 5 mg via INTRAVENOUS

## 2019-06-11 MED ORDER — FLEET ENEMA 7-19 GM/118ML RE ENEM: 1.0000 | ENEMA | Freq: Once | RECTAL | Status: DC | PRN

## 2019-06-11 MED ORDER — HOME MED STORE IN PYXIS: 1.0000 | Freq: Every day | Status: DC

## 2019-06-11 MED ORDER — BUPIVACAINE HCL (PF) 0.25 % IJ SOLN
INTRAMUSCULAR | Status: AC
  Filled 2019-06-11: qty 30

## 2019-06-11 MED ORDER — ACETAMINOPHEN 325 MG PO TABS: 325.0000 mg | ORAL_TABLET | ORAL | Status: DC | PRN

## 2019-06-11 MED ORDER — CEFAZOLIN SODIUM-DEXTROSE 2-4 GM/100ML-% IV SOLN
INTRAVENOUS | Status: AC
  Filled 2019-06-11: qty 100

## 2019-06-11 MED ORDER — BUPIVACAINE HCL (PF) 0.25 % IJ SOLN
INTRAMUSCULAR | Status: DC | PRN
  Administered 2019-06-11: 30 mL

## 2019-06-11 MED ORDER — ACETAMINOPHEN 500 MG PO TABS
500.0000 mg | ORAL_TABLET | Freq: Four times a day (QID) | ORAL | Status: AC
  Administered 2019-06-11 – 2019-06-12 (×2): 500 mg via ORAL
  Filled 2019-06-11 (×3): qty 1

## 2019-06-11 MED ORDER — CHLORHEXIDINE GLUCONATE 4 % EX LIQD: 60.0000 mL | Freq: Once | CUTANEOUS | Status: DC

## 2019-06-11 MED ORDER — FENTANYL CITRATE (PF) 100 MCG/2ML IJ SOLN
INTRAMUSCULAR | Status: AC
  Administered 2019-06-11: 25 ug via INTRAVENOUS
  Filled 2019-06-11: qty 2

## 2019-06-11 SURGICAL SUPPLY — 93 items
BIT DRILL 2.5 (BIT) ×3
BIT DRILL 3.1 (BIT) ×1 IMPLANT
BIT DRILL CANN 2.7 (BIT) ×3
BIT DRILL LOCK 4.3 (BIT) ×1 IMPLANT
BIT DRILL NLOCK SHRT 3.2X216 (BIT) ×1 IMPLANT
BIT DRILL SHRT 216X2.5XNS LF (BIT) IMPLANT
BIT DRILL SRG 2.7XCANN AO CPLG (BIT) IMPLANT
BIT DRL SHRT 216X2.5XNS LF (BIT) ×2
BIT DRL SRG 2.7XCANN AO CPLNG (BIT) ×2
BLADE CLIPPER SURG (BLADE) IMPLANT
BNDG CMPR MED 10X6 ELC LF (GAUZE/BANDAGES/DRESSINGS) ×2
BNDG ELASTIC 4X5.8 VLCR STR LF (GAUZE/BANDAGES/DRESSINGS) ×3 IMPLANT
BNDG ELASTIC 6X10 VLCR STRL LF (GAUZE/BANDAGES/DRESSINGS) ×1 IMPLANT
BNDG ELASTIC 6X5.8 VLCR STR LF (GAUZE/BANDAGES/DRESSINGS) ×3 IMPLANT
BNDG GAUZE ELAST 4 BULKY (GAUZE/BANDAGES/DRESSINGS) ×3 IMPLANT
COVER WAND RF STERILE (DRAPES) ×3 IMPLANT
CUFF TOURN SGL QUICK 42 (TOURNIQUET CUFF) ×1 IMPLANT
DRAPE C-ARM 42X72 X-RAY (DRAPES) ×3 IMPLANT
DRAPE C-ARMOR (DRAPES) ×3 IMPLANT
DRAPE IMP U-DRAPE 54X76 (DRAPES) ×3 IMPLANT
DRAPE ORTHO SPLIT 77X108 STRL (DRAPES) ×6
DRAPE SURG ORHT 6 SPLT 77X108 (DRAPES) ×4 IMPLANT
DRAPE U-SHAPE 47X51 STRL (DRAPES) ×3 IMPLANT
DRSG ADAPTIC 3X8 NADH LF (GAUZE/BANDAGES/DRESSINGS) ×4 IMPLANT
DRSG PAD ABDOMINAL 8X10 ST (GAUZE/BANDAGES/DRESSINGS) ×12 IMPLANT
ELECT REM PT RETURN 9FT ADLT (ELECTROSURGICAL) ×3
ELECTRODE REM PT RTRN 9FT ADLT (ELECTROSURGICAL) ×2 IMPLANT
GAUZE SPONGE 4X4 12PLY STRL (GAUZE/BANDAGES/DRESSINGS) ×3 IMPLANT
GAUZE SPONGE 4X4 12PLY STRL LF (GAUZE/BANDAGES/DRESSINGS) ×1 IMPLANT
GLOVE BIO SURGEON STRL SZ7.5 (GLOVE) ×6 IMPLANT
GLOVE BIOGEL PI IND STRL 8 (GLOVE) ×4 IMPLANT
GLOVE BIOGEL PI INDICATOR 8 (GLOVE) ×2
GOWN STRL REUS W/ TWL LRG LVL3 (GOWN DISPOSABLE) ×4 IMPLANT
GOWN STRL REUS W/ TWL XL LVL3 (GOWN DISPOSABLE) ×2 IMPLANT
GOWN STRL REUS W/TWL LRG LVL3 (GOWN DISPOSABLE) ×6
GOWN STRL REUS W/TWL XL LVL3 (GOWN DISPOSABLE) ×3
GUIDE WIRE UNTHRD 1.4X150 (Wire) ×3 IMPLANT
GUIDEWIRE UNTHRD 1.4X150 (Wire) IMPLANT
K-WIRE 2.0 (WIRE) ×15
K-WIRE FX234X2XDRL TIP NS (WIRE) ×10
KIT BASIN OR (CUSTOM PROCEDURE TRAY) ×3 IMPLANT
KIT TURNOVER KIT B (KITS) ×3 IMPLANT
KWIRE FX234X2XDRL TIP NS (WIRE) IMPLANT
MANIFOLD NEPTUNE II (INSTRUMENTS) ×3 IMPLANT
NS IRRIG 1000ML POUR BTL (IV SOLUTION) ×3 IMPLANT
PACK TOTAL JOINT (CUSTOM PROCEDURE TRAY) ×3 IMPLANT
PACK UNIVERSAL I (CUSTOM PROCEDURE TRAY) ×3 IMPLANT
PAD ABD 8X10 STRL (GAUZE/BANDAGES/DRESSINGS) ×3 IMPLANT
PAD ARMBOARD 7.5X6 YLW CONV (MISCELLANEOUS) ×6 IMPLANT
PAD CAST 4YDX4 CTTN HI CHSV (CAST SUPPLIES) ×2 IMPLANT
PADDING CAST COTTON 4X4 STRL (CAST SUPPLIES) ×6
PADDING CAST COTTON 6X4 STRL (CAST SUPPLIES) ×4 IMPLANT
PLATE FEM DL 238 10H RT (Plate) ×1 IMPLANT
PLATE PROXIMAL TIB RT 2H (Plate) ×1 IMPLANT
SCREW 70X4.0MM (Screw) ×1 IMPLANT
SCREW CANC 4.0X70MM (Screw) ×1 IMPLANT
SCREW CANC FT TI 4X36 (Screw) ×1 IMPLANT
SCREW CANCELLOUS 4.0X50MM FT (Screw) ×1 IMPLANT
SCREW CORT 34X3.5XST LCK NS (Screw) IMPLANT
SCREW CORT ST TI 4.5X50 (Screw) ×1 IMPLANT
SCREW CORTICAL 3.5X34MM (Screw) ×3 IMPLANT
SCREW CORTICAL 4.5X40MM (Screw) ×1 IMPLANT
SCREW CORTICAL 4.5X42MM (Screw) ×1 IMPLANT
SCREW CORTICAL 4.5X44MM (Screw) ×1 IMPLANT
SCREW CORTICAL 4.5X55 TI (Screw) ×1 IMPLANT
SCREW LOCK 85X5XSTNS TI (Screw) IMPLANT
SCREW LOCKING 38MMX5MM (Screw) ×1 IMPLANT
SCREW LOCKING 4.0X70MM (Screw) ×1 IMPLANT
SCREW LOCKING 40X5.0MM (Screw) ×2 IMPLANT
SCREW LOCKING 5.0MMX34MM (Screw) ×1 IMPLANT
SCREW LOCKING 5.0X85MM (Screw) ×9 IMPLANT
SCREW LOCKING 75X4.0MM (Screw) ×2 IMPLANT
SCREW PARTIAL THREAD 4X70MM (Screw) ×1 IMPLANT
STAPLER VISISTAT 35W (STAPLE) IMPLANT
STRIP CLOSURE SKIN 1/2X4 (GAUZE/BANDAGES/DRESSINGS) ×1 IMPLANT
SUT ETHILON 3 0 FSL (SUTURE) ×3 IMPLANT
SUT FIBERWIRE 2-0 18 17.9 3/8 (SUTURE) ×6
SUT MNCRL AB 4-0 PS2 18 (SUTURE) ×3 IMPLANT
SUT MON AB 2-0 CT1 27 (SUTURE) ×3 IMPLANT
SUT VIC AB 0 CT1 27 (SUTURE) ×9
SUT VIC AB 0 CT1 27XBRD ANBCTR (SUTURE) ×4 IMPLANT
SUT VIC AB 1 CT1 27 (SUTURE) ×3
SUT VIC AB 1 CT1 27XBRD ANBCTR (SUTURE) IMPLANT
SUT VIC AB 2-0 CT1 (SUTURE) ×1 IMPLANT
SUT VIC AB 2-0 CT1 27 (SUTURE) ×3
SUT VIC AB 2-0 CT1 TAPERPNT 27 (SUTURE) IMPLANT
SUTURE FIBERWR 2-0 18 17.9 3/8 (SUTURE) IMPLANT
TOWEL GREEN STERILE (TOWEL DISPOSABLE) ×6 IMPLANT
TOWEL GREEN STERILE FF (TOWEL DISPOSABLE) ×3 IMPLANT
TOWEL OR NON WOVEN STRL DISP B (DISPOSABLE) ×3 IMPLANT
TRAY FOLEY MTR SLVR 16FR STAT (SET/KITS/TRAYS/PACK) IMPLANT
WASHER ASNIS III 4.0 SCR (Washer) ×2 IMPLANT
WATER STERILE IRR 1000ML POUR (IV SOLUTION) ×6 IMPLANT

## 2019-06-11 NOTE — ED Notes (Signed)
Called Ortho Tech for long leg splint@[phone] 

## 2019-06-11 NOTE — Anesthesia Preprocedure Evaluation (Signed)
Anesthesia Evaluation  Patient identified by MRN, date of birth, ID band Patient awake  General Assessment Comment: . Shortness of breath    hx lung ca  . Arthritis  . Lung cancer dx'd 2002 . Metastasis to brain dx'd 2008 . 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 H/O osteoporosis  . H/O vitamin D deficiency   . Hypertension   . Dementia 2008   . Status post radiation therapy 2003   LEFT LUNG . Status post radiation therapy 11/07/2005   WHOLE BRAIN: DR [facility] . Status post radiation therapy 06/02/2008   GAMMA KNIFE OF RESECTED CAVITAY . On antineoplastic chemotherapy    TARCEVA . Nodule of right lung CT- 06/03/12   RIGHT UPPER LOBE . Thyroid adenoma  . Thyroid cancer 10/18/11 bx   adenoid nodules  . Anxiety   . Nodule of right lung 06/03/12   Upper Lobe . Lung metastasis    PET scan 05/05/13, RUL lung nodule . History of radiation therapy 07/28/13- 08/10/13   right lung metastasis 5000 cGy 10 sessions      Reviewed: Allergy & Precautions, NPO status , Patient's Chart, lab work & pertinent test results  History of Anesthesia Complications Negative for: history of anesthetic complications  Airway Mallampati: III  TM Distance: >3 FB Neck ROM: Full   Comment: Recessed chin, hx of radical neck dissection, no history of radiation to neck Dental no notable dental hx. (+) Dental Advisory Given, Poor Dentition   Pulmonary  Hx of lung cancer s/p chemo/radiation/ lobectomy, metastasis to brain s/p craniotomy, thyroid cancer s/p radical neck dissection   Pulmonary exam normal breath sounds clear to auscultation       Cardiovascular hypertension, Normal cardiovascular exam Rhythm:Regular Rate:Normal     Neuro/Psych dementia negative psych ROS   GI/Hepatic negative GI ROS, Neg liver ROS,   Endo/Other  negative endocrine  ROS  Renal/GU negative Renal ROS   UTI, been on several days of antibiotics (rocephin), afebrile and white count 5.4 negative genitourinary   Musculoskeletal  (+) Arthritis , Osteoarthritis,    Abdominal   Peds negative pediatric ROS (+)  Hematology negative hematology ROS (+)   Anesthesia Other Findings   Reproductive/Obstetrics negative OB ROS                             Anesthesia Physical  Anesthesia Plan  ASA: IV  Anesthesia Plan: General   Post-op Pain Management:    Induction: Intravenous  PONV Risk Score and Plan: 3 and Ondansetron and Treatment may vary due to age or medical condition  Airway Management Planned: LMA and Oral ETT  Additional Equipment:   Intra-op Plan:   Post-operative Plan: Extubation in OR  Informed Consent: I have reviewed the patients History and Physical, chart, labs and discussed the procedure including the risks, benefits and alternatives for the proposed anesthesia with the patient or authorized representative who has indicated his/her understanding and acceptance.     Dental advisory given  Plan Discussed with: CRNA, Anesthesiologist and Surgeon  Anesthesia Plan Comments:         Anesthesia Quick Evaluation

## 2019-06-11 NOTE — ED Notes (Signed)
Due to high census, pt holding in the ED. Pt placed on hospital bed for comfort. Bed in locked and lowest position and call bell within reach.

## 2019-06-11 NOTE — Anesthesia Procedure Notes (Signed)
Procedure Name: Intubation Date/Time: 06/11/2019 10:42 AM Performed by: Amadeo Garnet, CRNA Pre-anesthesia Checklist: Patient identified, Emergency Drugs available, Suction available, Patient being monitored and Timeout performed Patient Re-evaluated:Patient Re-evaluated prior to induction Oxygen Delivery Method: Circle system utilized Preoxygenation: Pre-oxygenation with 100% oxygen Induction Type: IV induction and Rapid sequence Laryngoscope Size: Mac and 3 Grade View: Grade I Tube type: Oral Tube size: 7.0 mm Number of attempts: 1 Airway Equipment and Method: Stylet Placement Confirmation: ETT inserted through vocal cords under direct vision,  positive ETCO2 and breath sounds checked- equal and bilateral Secured at: 22 cm Tube secured with: Tape Dental Injury: Teeth and Oropharynx as per pre-operative assessment

## 2019-06-11 NOTE — ED Notes (Signed)
Admitting MD at bedside.

## 2019-06-11 NOTE — H&P (View-Only) (Signed)
Reason for Consult:Right distal femur fx Referring Physician: A Hongalgi  Valerie [name] is an 74 y.o. female.  HPI: Valerie [name] was getting out of the shower when she slipped and fell. She had immediate right knee pain and could not get up. She activated her LifeAlert and was brought to the ED. X-rays showed a distal femur fx and tibia plateau fx and orthopedic surgery was consulted. She lives at home with her husband and usually ambulates with a RW.  Past Medical History:  Diagnosis Date  . Abnormal Pap smear 2006  . Anal fistula   . Ankle fracture   . Anxiety   . Arthritis   . Atrophic vaginitis 2008  . Cataract   . Dementia ([place]) 2009  . Dyspareunia 2008  . H/O osteoporosis   . H/O varicella   . H/O vitamin D deficiency   . Headache 07/24/2016  . Heart murmur   . History of measles, mumps, or rubella   . History of radiation therapy 07/28/13- 08/10/13   right lung metastasis 5000 cGy 10 sessions  . Hypertension   . Lung cancer ([place]) dx'd 2002  . Lung cancer ([place])   . Lung cancer ([place])   . Lung metastasis ([place])    PET scan 05/05/13, RUL lung nodule  . 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 Nodule of right lung CT- 06/03/12   RIGHT UPPER LOBE  . Nodule of right lung 06/03/12   Upper Lobe  . On antineoplastic chemotherapy    TARCEVA  . Osteoporosis 2010  . Primary cancer of right upper lobe of lung ([place]) 04/22/2009   Qualifier: Diagnosis of  By: Nils Pyle CMA ([place]), Mearl Latin    . Shortness of breath    hx lung ca   . Status post chemotherapy 2003   CARBOPLATIN/PACLITAXEL /STATUS POST CLINICAL TRAIL OF CELEBREX AND IRESSA AT [facility] FOR 1 YEAR  . Status post radiation therapy 2003   LEFT LUNG  . Status post radiation therapy 11/07/2005   WHOLE BRAIN: DR Larkin [facility]  . Status post radiation therapy 06/02/2008   GAMMA KNIFE OF RESECTED CAVITAY  . Thyroid adenoma    ?  [name] Thyroid cancer ([place]) 10/18/11 bx   adenoid nodules   .  Yeast infection     Past Surgical History:  Procedure Laterality Date  . BRAIN SURGERY    . LEFT LOWER LOBECTOMY  05/2001  . LEFT OCCIPITAL CRANIOTOMY  09/21/2005   tumor  . LUNG LOBECTOMY    . NECK SURGERY    . ORIF ANKLE FRACTURE Left 10/02/2014   Procedure: OPEN REDUCTION INTERNAL FIXATION (ORIF) ANKLE FRACTURE;  Surgeon: Alta [place], MD;  Location: [facility];  Service: Orthopedics;  Laterality: Left;  . RADICAL NECK DISSECTION  10/18/2011   Procedure: RADICAL NECK DISSECTION;  Surgeon: Izora Gala, MD;  Location: [place];  Service: ENT;  Laterality: Right;  RIGHT MODIFIED NECK DISSECTION /POSSIBLE RIGHT THYROIDECTOM  . THYROIDECTOMY  10/18/2011   Procedure: THYROIDECTOMY WITH RADICAL NECK DISSECTION;  Surgeon: Izora Gala, MD;  Location: [facility] OR;  Service: ENT;  Laterality: Right;    Family History  Problem Relation Age of Onset  . Gait disorder Mother   . Cancer Mother        Meningioma  . Cancer Father        Pancreatic  . Heart failure Sister   . Cancer Maternal Aunt        menigeoma  Social History:  reports that she has never smoked. She has never used smokeless tobacco. She reports that she does not drink alcohol or use drugs.  Allergies: No Known Allergies  Medications: I have reviewed the patient's current medications.  Results for orders placed or performed during the hospital encounter of 06/10/19 (from the past 48 hour(s))  CBC with Differential/Platelet     Status: Abnormal   Collection Time: 06/10/19 11:17 PM  Result Value Ref Range   WBC 7.2 4.0 - 10.5 K/uL   RBC 3.82 (L) 3.87 - 5.11 MIL/uL   Hemoglobin 11.3 (L) 12.0 - 15.0 g/dL   HCT 37.1 36.0 - 46.0 %   MCV 97.1 80.0 - 100.0 fL   MCH 29.6 26.0 - 34.0 pg   MCHC 30.5 30.0 - 36.0 g/dL   RDW 13.5 11.5 - 15.5 %   Platelets 193 150 - 400 K/uL   nRBC 0.0 0.0 - 0.2 %   Neutrophils Relative % 83 %   Neutro Abs 5.9 1.7 - 7.7 K/uL   Lymphocytes Relative 8 %   Lymphs Abs 0.6 (L) 0.7 - 4.0 K/uL   Monocytes  Relative 8 %   Monocytes Absolute 0.6 0.1 - 1.0 K/uL   Eosinophils Relative 1 %   Eosinophils Absolute 0.1 0.0 - 0.5 K/uL   Basophils Relative 0 %   Basophils Absolute 0.0 0.0 - 0.1 K/uL   Immature Granulocytes 0 %   Abs Immature Granulocytes 0.03 0.00 - 0.07 K/uL    Comment: Performed at [facility], [place] [address] [place], [place] [address]  I-stat chem 8, ED (not at [facility] or [facility])     Status: Abnormal   Collection Time: 06/11/19 12:16 AM  Result Value Ref Range   Sodium 141 135 - 145 mmol/L   Potassium 3.6 3.5 - 5.1 mmol/L   Chloride 107 98 - 111 mmol/L   BUN 20 8 - 23 mg/dL   Creatinine, Ser 1.00 0.44 - 1.00 mg/dL   Glucose, Bld 127 (H) 70 - 99 mg/dL   Calcium, Ion 1.29 1.15 - 1.40 mmol/L   TCO2 26 22 - 32 mmol/L   Hemoglobin 11.9 (L) 12.0 - 15.0 g/dL   HCT 35.0 (L) 36.0 - 46.0 %  SARS Coronavirus 2 by RT PCR (hospital order, performed in [place] hospital lab) Nasopharyngeal Nasopharyngeal Swab     Status: None   Collection Time: 06/11/19 12:31 AM   Specimen: Nasopharyngeal Swab  Result Value Ref Range   SARS Coronavirus 2 NEGATIVE NEGATIVE    Comment: (NOTE) If result is NEGATIVE SARS-CoV-2 target nucleic acids are NOT DETECTED. The SARS-CoV-2 RNA is generally detectable in upper and lower  respiratory specimens during the acute phase of infection. The lowest  concentration of SARS-CoV-2 viral copies this assay can detect is 250  copies / mL. A negative result does not preclude SARS-CoV-2 infection  and should not be used as the sole basis for treatment or other  patient management decisions.  A negative result may occur with  improper specimen collection / handling, submission of specimen other  than nasopharyngeal swab, presence of viral mutation(s) within the  areas targeted by this assay, and inadequate number of viral copies  (<250 copies / mL). A negative result must be combined with clinical  observations, patient history, and epidemiological  information. If result is POSITIVE SARS-CoV-2 target nucleic acids are DETECTED. The SARS-CoV-2 RNA is generally detectable in upper and lower  respiratory specimens dur ing the  acute phase of infection.  Positive  results are indicative of active infection with SARS-CoV-2.  Clinical  correlation with patient history and other diagnostic information is  necessary to determine patient infection status.  Positive results do  not rule out bacterial infection or co-infection with other viruses. If result is PRESUMPTIVE POSTIVE SARS-CoV-2 nucleic acids MAY BE PRESENT.   A presumptive positive result was obtained on the submitted specimen  and confirmed on repeat testing.  While 2019 novel coronavirus  (SARS-CoV-2) nucleic acids may be present in the submitted sample  additional confirmatory testing may be necessary for epidemiological  and / or clinical management purposes  to differentiate between  SARS-CoV-2 and other Sarbecovirus currently known to infect humans.  If clinically indicated additional testing with an alternate test  methodology [phone]) is advised. The SARS-CoV-2 RNA is generally  detectable in upper and lower respiratory sp ecimens during the acute  phase of infection. The expected result is Negative. Fact Sheet for Patients:  StrictlyIdeas.no Fact Sheet for Healthcare Providers: BankingDealers.co.za This test is not yet approved or cleared by the [place] FDA and has been authorized for detection and/or diagnosis of SARS-CoV-2 by FDA under an Emergency Use Authorization (EUA).  This EUA will remain in effect (meaning this test can be used) for the duration of the COVID-19 declaration under Section 564(b)(1) of the Act, 21 U.S.C. section 360bbb-3(b)(1), unless the authorization is terminated or revoked sooner. Performed at [facility], [place] [address] [place], [place] [address]   CBC     Status:  Abnormal   Collection Time: 06/11/19  5:42 AM  Result Value Ref Range   WBC 8.3 4.0 - 10.5 K/uL   RBC 3.41 (L) 3.87 - 5.11 MIL/uL   Hemoglobin 10.4 (L) 12.0 - 15.0 g/dL   HCT 33.1 (L) 36.0 - 46.0 %   MCV 97.1 80.0 - 100.0 fL   MCH 30.5 26.0 - 34.0 pg   MCHC 31.4 30.0 - 36.0 g/dL   RDW 13.6 11.5 - 15.5 %   Platelets 163 150 - 400 K/uL   nRBC 0.0 0.0 - 0.2 %    Comment: Performed at [facility], [place] [address], [place], [place] [address]  Basic metabolic panel     Status: Abnormal   Collection Time: 06/11/19  5:42 AM  Result Value Ref Range   Sodium 139 135 - 145 mmol/L   Potassium 4.2 3.5 - 5.1 mmol/L   Chloride 107 98 - 111 mmol/L   CO2 24 22 - 32 mmol/L   Glucose, Bld 137 (H) 70 - 99 mg/dL   BUN 19 8 - 23 mg/dL   Creatinine, Ser 0.96 0.44 - 1.00 mg/dL   Calcium 9.4 8.9 - 10.3 mg/dL   GFR calc non Af Amer 59 (L) >60 mL/min   GFR calc Af Amer >60 >60 mL/min   Anion gap 8 5 - 15    Comment: Performed at [facility], [place] [address], [place], [place] [address]  Type and screen [place]     Status: None   Collection Time: 06/11/19  5:42 AM  Result Value Ref Range   ABO/RH(D) A NEG    Antibody Screen NEG    Sample Expiration      06/14/2019,2359 Performed at [facility], [place] [address] [place], [place] [address]   ABO/Rh     Status: None   Collection Time: 06/11/19  5:44 AM  Result Value Ref  Range   ABO/RH(D)      A NEG Performed at [place] [address] [place], [place] [address]     Dg Tibia/fibula Right  Result Date: 06/11/2019 CLINICAL DATA:  Pain after fall EXAM: RIGHT TIBIA AND FIBULA - 2 VIEW COMPARISON:  None. FINDINGS: Comminuted impacted intra-articular fracture of the intracondylar notch and medial femoral condyle. There is also comminuted mildly impacted intra-articular fracture of the lateral tibial plateau. There is diffuse osteopenia. Prepatellar  subcutaneous edema. IMPRESSION: Comminuted fractures of the distal femur and lateral tibial plateau. Prepatellar subcutaneous edema. Diffuse osteopenia. Electronically Signed   By: Prudencio Pair M.D.   On: 06/11/2019 00:18   Ct Head Wo Contrast  Result Date: 06/11/2019 CLINICAL DATA:  Fall, on blood thinner EXAM: CT HEAD WITHOUT CONTRAST TECHNIQUE: Contiguous axial images were obtained from the base of the skull through the vertex without intravenous contrast. COMPARISON:  September 13, 2018 FINDINGS: The study is limited due to patient motion. Brain: No evidence of acute territorial infarction, hemorrhage, hydrocephalus,extra-axial collection or mass lesion/mass effect. There is dilatation the ventricles and sulci consistent with age-related atrophy. Low-attenuation changes in the deep white matter consistent with small vessel ischemia. Vascular: No hyperdense vessel or unexpected calcification. Skull: Postsurgical changes from a prior left occipital craniotomy are again noted. Sinuses/Orbits: The visualized paranasal sinuses and mastoid air cells are clear. The orbits and globes intact. Other: None Cervical spine: Alignment: Physiologic Skull base and vertebrae: Visualized skull base is intact. No atlanto-occipital dissociation. The vertebral body heights are well maintained. No fracture or pathologic osseous lesion seen. Soft tissues and spinal canal: The visualized paraspinal soft tissues are unremarkable. No prevertebral soft tissue swelling is seen. The spinal canal is grossly unremarkable, no large epidural collection or significant canal narrowing. Disc levels: Mild disc height loss with uncovertebral osteophytes and disc osteophyte complex is seen most notable at C4-C5. Upper chest: The lung apices are clear. Thoracic inlet is within normal limits. Other: None IMPRESSION: No acute intracranial abnormality, somewhat limited due to patient motion. Findings consistent with age related atrophy and chronic  small vessel ischemia No acute fracture or malalignment of the spine. Electronically Signed   By: Prudencio Pair M.D.   On: 06/11/2019 00:38   Ct Cervical Spine Wo Contrast  Result Date: 06/11/2019 CLINICAL DATA:  Fall, on blood thinner EXAM: CT HEAD WITHOUT CONTRAST TECHNIQUE: Contiguous axial images were obtained from the base of the skull through the vertex without intravenous contrast. COMPARISON:  September 13, 2018 FINDINGS: The study is limited due to patient motion. Brain: No evidence of acute territorial infarction, hemorrhage, hydrocephalus,extra-axial collection or mass lesion/mass effect. There is dilatation the ventricles and sulci consistent with age-related atrophy. Low-attenuation changes in the deep white matter consistent with small vessel ischemia. Vascular: No hyperdense vessel or unexpected calcification. Skull: Postsurgical changes from a prior left occipital craniotomy are again noted. Sinuses/Orbits: The visualized paranasal sinuses and mastoid air cells are clear. The orbits and globes intact. Other: None Cervical spine: Alignment: Physiologic Skull base and vertebrae: Visualized skull base is intact. No atlanto-occipital dissociation. The vertebral body heights are well maintained. No fracture or pathologic osseous lesion seen. Soft tissues and spinal canal: The visualized paraspinal soft tissues are unremarkable. No prevertebral soft tissue swelling is seen. The spinal canal is grossly unremarkable, no large epidural collection or significant canal narrowing. Disc levels: Mild disc height loss with uncovertebral osteophytes and disc osteophyte complex is seen most notable at C4-C5. Upper chest: The  lung apices are clear. Thoracic inlet is within normal limits. Other: None IMPRESSION: No acute intracranial abnormality, somewhat limited due to patient motion. Findings consistent with age related atrophy and chronic small vessel ischemia No acute fracture or malalignment of the spine.  Electronically Signed   By: Prudencio Pair M.D.   On: 06/11/2019 00:38   Ct Knee Right Wo Contrast  Result Date: 06/11/2019 CLINICAL DATA:  Complex knee fractures. EXAM: CT OF THE right KNEE WITHOUT CONTRAST TECHNIQUE: Multidetector CT imaging of the right knee was performed according to the standard protocol. Multiplanar CT image reconstructions were also generated. COMPARISON:  Right knee radiographs 06/10/2019 FINDINGS: Oblique coursing mildly displaced fracture extending from the medial supracondylar region through the condylar notch. Maximum displacement along the articular surface is approximately 6 mm. Associated die punch type fracture involving the lateral tibial plateau with approximately 5 mm of depression and mild comminution. The fracture extends out through the lateral cortex of the tibia near the metadiaphyseal junction. Associated moderate to large lipohemarthrosis is noted. Fairly significant underlying osteoporosis. The patella and fibula are intact. Grossly by CT the cruciate and collateral ligaments appear intact. The quadriceps and patellar tendons are intact. IMPRESSION: 1. Oblique coursing intra-articular medial femoral condylar fracture as detailed above. 2. Mildly depressed die punch type fracture involving the lateral tibial plateau. Electronically Signed   By: Marijo Sanes M.D.   On: 06/11/2019 06:55   Dg Chest Port 1 View  Result Date: 06/11/2019 CLINICAL DATA:  Preop, lung cancer EXAM: PORTABLE CHEST 1 VIEW COMPARISON:  September 13, 2018 FINDINGS: The cardiomediastinal silhouette is unchanged from prior exam. Aortic knob calcifications. Again noted is left hilar surgical clips and postsurgical changes. Elevation of the left hemidiaphragm. The right lung is clear. IMPRESSION: Stable postsurgical changes.  No acute cardiopulmonary process. Electronically Signed   By: Prudencio Pair M.D.   On: 06/11/2019 02:13   Dg Knee Complete 4 Views Right  Result Date: 06/11/2019 CLINICAL  DATA:  Knee pain EXAM: RIGHT KNEE - COMPLETE 4+ VIEW COMPARISON:  None. FINDINGS: There is comminuted slightly impacted fracture seen through the intracondylar notch extending through the medial femoral condyle and medial metadiaphysis. There is also intra-articular mildly comminuted impacted fracture seen through the lateral tibial plateau. A small lipohemarthrosis is seen. Prepatellar subcutaneous edema is noted. IMPRESSION: 1. Comminuted impacted intra-articular fracture through the intracondylar notch extending through the medial femoral condyle and femoral shaft. 2. Comminuted mildly impacted intra-articular fracture of the lateral tibial plateau. 3. Small lipohemarthrosis Electronically Signed   By: Prudencio Pair M.D.   On: 06/11/2019 00:13   Dg Hip Unilat W Or Wo Pelvis 2-3 Views Right  Result Date: 06/11/2019 CLINICAL DATA:  Hip pain after fall EXAM: DG HIP (WITH OR WITHOUT PELVIS) 2-3V RIGHT COMPARISON:  None. FINDINGS: There is no evidence of hip fracture or dislocation. 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 IMPRESSION: No acute osseous abnormality. Electronically Signed   By: Prudencio Pair M.D.   On: 06/11/2019 00:12    Review of Systems  Constitutional: Negative for weight loss.  HENT: Negative for ear discharge, ear pain, hearing loss and tinnitus.   Eyes: Negative for blurred vision, double vision, photophobia and pain.  Respiratory: Negative for cough, sputum production and shortness of breath.   Cardiovascular: Negative for chest pain.  Gastrointestinal: Negative for abdominal pain, nausea and vomiting.  Genitourinary: Negative for dysuria, flank pain, frequency and urgency.  Musculoskeletal: Positive for joint pain (  Right knee). Negative for back pain, falls, myalgias and neck pain.  Neurological: Negative for dizziness, tingling, sensory change, focal weakness, loss of consciousness and headaches.  Endo/Heme/Allergies: Does not  bruise/bleed easily.  Psychiatric/Behavioral: Negative for depression, memory loss and substance abuse. The patient is not nervous/anxious.    Blood pressure (!) 108/54, pulse 69, temperature 98.3 F (36.8 C), temperature source Oral, resp. rate 20, height 5\' 8"  (1.727 m), weight 78.5 kg, SpO2 98 %. Physical Exam  Constitutional: She appears well-developed and well-nourished. No distress.  HENT:  Head: Normocephalic and atraumatic.  Eyes: Conjunctivae are normal. Right eye exhibits no discharge. Left eye exhibits no discharge. No scleral icterus.  Neck: Normal range of motion.  Cardiovascular: Normal rate and regular rhythm.  Respiratory: Effort normal. No respiratory distress.  Musculoskeletal:     Comments: RLE No traumatic wounds, ecchymosis, or rash  KI in place  No ankle effusion  Sens DPN, SPN, TN intact  Motor EHL, ext, flex, evers 5/5  DP 1+, PT 0, No significant edema  Neurological: She is alert.  Skin: Skin is warm and dry. She is not diaphoretic.  Psychiatric: She has a normal mood and affect. Her behavior is normal.    Assessment/Plan: Right distal femur fx -- Plan ORIF today by Dr. Percell Miller. NPO until then. Right tibia plateau fx -- Plan non-operative management Multiple medical problems including metastatic non-small cell lung cancer status post lobectomy, resection of brain tumor, history of DVT on Xarelto, dementia, and recurrent UTIs -- per primary service    Lisette Abu, PA-C Orthopedic Surgery [phone] 06/11/2019, 10:12 AM

## 2019-06-11 NOTE — Progress Notes (Signed)
Orthopedic Tech Progress Note Patient Details:  Valerie [name] 10-22-1945 [id number]  Ortho Devices Type of Ortho Device: Long leg splint Ortho Device/Splint Interventions: Application, Ordered   Post Interventions Patient Tolerated: Well Instructions Provided: Care of device, Adjustment of device   Melony Overly T 06/11/2019, 12:48 AM

## 2019-06-11 NOTE — Progress Notes (Signed)
Patient seen on 5N post-operatively from ORIF of distal right femur with Dr. Percell Miller.  Tibial plateau fracture non-operative.  She is stable and appears in no distress.  Complains of some ongoing pain and being cold.    H&P and progress note of earlier today reviewed in detail and I agree with the plan as outlined.  Husband at bedside, updated.  - will follow up with Ortho regarding when okay to resume Xarelto - order placed for patient to take her home chemo medication, Tagrisso - pain management - bowel regimen - PT evaluation - weight bearing per ortho  Dispo: expect to SNF/rehab once cleared by ortho for d/c

## 2019-06-11 NOTE — ED Notes (Signed)
Report given to [facility]. [facility] present to transport patient to [facility] Short Stay.

## 2019-06-11 NOTE — Transfer of Care (Signed)
Immediate Anesthesia Transfer of Care Note  Patient: Valerie [name]  Procedure(s) Performed: OPEN REDUCTION INTERNAL FIXATION (ORIF) DISTAL FEMUR FRACTURE (Right Leg Upper) Open Reduction Internal Fixation (Orif) Tibial Plateau (Right )  Patient Location: PACU  Anesthesia Type:General  Level of Consciousness: awake, alert  and oriented  Airway & Oxygen Therapy: Patient Spontanous Breathing and Patient connected to nasal cannula oxygen  Post-op Assessment: Report given to RN, Post -op Vital signs reviewed and stable and Patient moving all extremities  Post vital signs: Reviewed and stable  Last Vitals:  Vitals Value Taken Time  BP 131/61 06/11/19 1339  Temp    Pulse 80 06/11/19 1341  Resp 13 06/11/19 1341  SpO2 100 % 06/11/19 1341  Vitals shown include unvalidated device data.  Last Pain:  Vitals:   06/11/19 0310  TempSrc:   PainSc: Asleep         Complications: No apparent anesthesia complications

## 2019-06-11 NOTE — ED Notes (Signed)
XR at bedside

## 2019-06-11 NOTE — Discharge Instructions (Signed)
° ° °  Right Leg Femur and Tibia Fractures: Keep knee immobilizer on when ambulating. Non weight bearing right leg.  It is ok to bend your knee as tolerated. Elevate to reduce pain / swelling. Ice as needed. Keep dressings on and dry until follow up in 10-14 days in the office with Dr. Percell Miller. Resume your Xarelto for DVT prophylaxis.

## 2019-06-11 NOTE — Progress Notes (Signed)
PROGRESS NOTE   Valerie [name]  SWF:[id number]    DOB: December 05, 1945    DOA: 06/10/2019  PCP: [name], MD   I have briefly reviewed patients previous medical records in [facility].  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 coli UTI, presented initially to the [facility] ED due to acute onset of right knee pain after she slipped at home while getting out of the shower and fell without hitting her head.  She was admitted for right distal femur fracture and right tibial plateau fracture.  Orthopedics was consulted and advised transferring to [facility] for surgery.  Last dose of Xarelto per patient was on 10/21 at approximately 8 PM.   Assessment & Plan:   Principal Problem:   Closed fracture of distal end of right femur, initial encounter [facility]) Active Problems:   Primary cancer of right upper lobe of lung ([place])   Dementia ([place])   Closed fracture of lateral portion of right tibial plateau   History of DVT of lower extremity   Right distal femur fracture and right tibial plateau fracture  Sustained status post mechanical fall at home.  Orthopedics consulted, advised transferring to [facility] for surgery.  Patient is moderate risk for perioperative CV complications but may proceed with surgery with close perioperative monitoring.  Last dose of Xarelto was 10/21 at approximately 8 PM.  Updated orthopedics and defer decision regarding timing of surgery to orthopedics.  Postop may require SNF for STR.  CT head and C-spine without acute abnormalities.  Acute blood loss anemia  Hemoglobin in September was 13.3, presented with hemoglobin of 11.3 which is gradually dropped to 10.4.  Likely to drop further perioperatively.  Follow CBC daily  and transfuse for hemoglobin 7 g or less.  Metastatic non-small cell lung cancer/adenocarcinoma  S/p lobectomy, resection of brain tumor and chemoradiation.  Currently managed with Michaela Corner with Dr. Curt Bears, oncology whom she last saw 05/12/2019 with plans to follow-up in 6 weeks with repeat imaging for restaging of her disease.  Her recent MRI showed no concerning findings for brain metastasis  Chest x-ray shows stable postsurgical changes and no acute cardiopulmonary process.  History of DVT  No evidence of acute VTE  Holding Xarelto for surgery.  Resume Xarelto postop when cleared by orthopedics.  Dementia  Suspect mild.  Continue Aricept.  E. coli/Enterococcus faecalis UTI  Appears to have improved with no systemic signs or symptoms of infection.  Keflex would cover E. coli but not Enterococcus, resistant to ampicillin.  If has recurrent symptoms, may need to consider coverage for Enterococcus.   DVT prophylaxis: SCDs.  Postop resume Xarelto when okay with orthopedics. Code Status: Full Family Communication: None at bedside Disposition: Seen this morning at [facility] ED.  Plans at that time were to transfer to [facility] for surgery and then admission to [facility].   Consultants:  Ortho  Procedures:  Right lower extremity immobilizing splint  Antimicrobials:  Keflex   Subjective: Patient seen this morning in ED.  No significant pain if lying still but severe pain on moving right lower extremity.  Denies dysuria, urinary frequency, fever or chills.  No chest pain, dyspnea or palpitations.  Denies any other complaints.  As per RN, no acute issues  noted  Objective:  Vitals:   06/11/19 0730 06/11/19 0800 06/11/19 0830 06/11/19 0900  BP: 114/63 99/68 (!) 108/57 (!) 108/54  Pulse:  86 79 69  Resp: (!) 21 18 (!) 24 20  Temp:      TempSrc:      SpO2:  98% 96% 98%  Weight:      Height:        Examination:  General exam: Pleasant elderly  female, moderately built and nourished lying comfortably propped up in bed without distress.  Oral mucosa moist. Respiratory system: Clear to auscultation. Respiratory effort normal. Cardiovascular system: S1 & S2 heard, RRR. No JVD, murmurs, rubs, gallops or clicks. No pedal edema. Gastrointestinal system: Abdomen is nondistended, soft and nontender. No organomegaly or masses felt. Normal bowel sounds heard. Central nervous system: Alert and oriented. No focal neurological deficits. Extremities: Symmetric 5 x 5 power.  Right lower extremity/knee in immobilizing splint.  Trace right ankle edema.  Able to wiggle right toes well.  Neurovascular bundle intact. Skin: No rashes, lesions or ulcers Psychiatry: Judgement and insight appear normal. Mood & affect appropriate.     Data Reviewed: I have personally reviewed following labs and imaging studies  CBC: Recent Labs  Lab 06/10/19 2317 06/11/19 0016 06/11/19 0542  WBC 7.2  --  8.3  NEUTROABS 5.9  --   --   HGB 11.3* 11.9* 10.4*  HCT 37.1 35.0* 33.1*  MCV 97.1  --  97.1  PLT 193  --  546   Basic Metabolic Panel: Recent Labs  Lab 06/11/19 0016 06/11/19 0542  NA 141 139  K 3.6 4.2  CL 107 107  CO2  --  24  GLUCOSE 127* 137*  BUN 20 19  CREATININE 1.00 0.96  CALCIUM  --  9.4   Liver Function Tests: No results for input(s): AST, ALT, ALKPHOS, BILITOT, PROT, ALBUMIN in the last 168 hours.  Cardiac Enzymes: No results for input(s): CKTOTAL, CKMB, CKMBINDEX, TROPONINI in the last 168 hours.  CBG: No results for input(s): GLUCAP in the last 168 hours.  Recent Results (from the past 240 hour(s))  SARS Coronavirus 2 by RT PCR (hospital order, performed in [facility] hospital lab) Nasopharyngeal Nasopharyngeal Swab     Status: None   Collection Time: 06/11/19 12:31 AM   Specimen: Nasopharyngeal Swab  Result Value Ref Range Status   SARS Coronavirus 2 NEGATIVE NEGATIVE Final    Comment: (NOTE) If result is NEGATIVE SARS-CoV-2  target nucleic acids are NOT DETECTED. The SARS-CoV-2 RNA is generally detectable in upper and lower  respiratory specimens during the acute phase of infection. The lowest  concentration of SARS-CoV-2 viral copies this assay can detect is 250  copies / mL. A negative result does not preclude SARS-CoV-2 infection  and should not be used as the sole basis for treatment or other  patient management decisions.  A negative result may occur with  improper specimen collection / handling, submission of specimen other  than nasopharyngeal swab, presence of viral mutation(s) within the  areas targeted by this assay, and inadequate number of viral copies  (<250 copies / mL). A negative result must be combined with clinical  observations, patient history, and epidemiological information. If result is POSITIVE SARS-CoV-2 target nucleic acids are DETECTED. The SARS-CoV-2 RNA is generally detectable in upper and lower  respiratory specimens dur ing the acute phase of infection.  Positive  results are indicative of active infection with SARS-CoV-2.  Clinical  correlation with patient history  and other diagnostic information is  necessary to determine patient infection status.  Positive results do  not rule out bacterial infection or co-infection with other viruses. If result is PRESUMPTIVE POSTIVE SARS-CoV-2 nucleic acids MAY BE PRESENT.   A presumptive positive result was obtained on the submitted specimen  and confirmed on repeat testing.  While 2019 novel coronavirus  (SARS-CoV-2) nucleic acids may be present in the submitted sample  additional confirmatory testing may be necessary for epidemiological  and / or clinical management purposes  to differentiate between  SARS-CoV-2 and other Sarbecovirus currently known to infect humans.  If clinically indicated additional testing with an alternate test  methodology [id number]) is advised. The SARS-CoV-2 RNA is generally  detectable in upper and lower  respiratory sp ecimens during the acute  phase of infection. The expected result is Negative. Fact Sheet for Patients:  StrictlyIdeas.no Fact Sheet for Healthcare Providers: BankingDealers.co.za This test is not yet approved or cleared by the [place] FDA and has been authorized for detection and/or diagnosis of SARS-CoV-2 by FDA under an Emergency Use Authorization (EUA).  This EUA will remain in effect (meaning this test can be used) for the duration of the COVID-19 declaration under Section 564(b)(1) of the Act, 21 U.S.C. section 360bbb-3(b)(1), unless the authorization is terminated or revoked sooner. Performed at [facility], [place] [address] [place], [place] [address]          Radiology Studies: Dg Tibia/fibula Right  Result Date: 06/11/2019 CLINICAL DATA:  Pain after fall EXAM: RIGHT TIBIA AND FIBULA - 2 VIEW COMPARISON:  None. FINDINGS: Comminuted impacted intra-articular fracture of the intracondylar notch and medial femoral condyle. There is also comminuted mildly impacted intra-articular fracture of the lateral tibial plateau. There is diffuse osteopenia. Prepatellar subcutaneous edema. IMPRESSION: Comminuted fractures of the distal femur and lateral tibial plateau. Prepatellar subcutaneous edema. Diffuse osteopenia. Electronically Signed   By: Prudencio Pair M.D.   On: 06/11/2019 00:18   Ct Head Wo Contrast  Result Date: 06/11/2019 CLINICAL DATA:  Fall, on blood thinner EXAM: CT HEAD WITHOUT CONTRAST TECHNIQUE: Contiguous axial images were obtained from the base of the skull through the vertex without intravenous contrast. COMPARISON:  September 13, 2018 FINDINGS: The study is limited due to patient motion. Brain: No evidence of acute territorial infarction, hemorrhage, hydrocephalus,extra-axial collection or mass lesion/mass effect. There is dilatation the ventricles and sulci consistent with age-related  atrophy. Low-attenuation changes in the deep white matter consistent with small vessel ischemia. Vascular: No hyperdense vessel or unexpected calcification. Skull: Postsurgical changes from a prior left occipital craniotomy are again noted. Sinuses/Orbits: The visualized paranasal sinuses and mastoid air cells are clear. The orbits and globes intact. Other: None Cervical spine: Alignment: Physiologic Skull base and vertebrae: Visualized skull base is intact. No atlanto-occipital dissociation. The vertebral body heights are well maintained. No fracture or pathologic osseous lesion seen. Soft tissues and spinal canal: The visualized paraspinal soft tissues are unremarkable. No prevertebral soft tissue swelling is seen. The spinal canal is grossly unremarkable, no large epidural collection or significant canal narrowing. Disc levels: Mild disc height loss with uncovertebral osteophytes and disc osteophyte complex is seen most notable at C4-C5. Upper chest: The lung apices are clear. Thoracic inlet is within normal limits. Other: None IMPRESSION: No acute intracranial abnormality, somewhat limited due to patient motion. Findings consistent with age related atrophy and chronic small vessel ischemia No acute fracture or malalignment of the spine. Electronically Signed   By: Prudencio Pair  M.D.   On: 06/11/2019 00:38   Ct Cervical Spine Wo Contrast  Result Date: 06/11/2019 CLINICAL DATA:  Fall, on blood thinner EXAM: CT HEAD WITHOUT CONTRAST TECHNIQUE: Contiguous axial images were obtained from the base of the skull through the vertex without intravenous contrast. COMPARISON:  September 13, 2018 FINDINGS: The study is limited due to patient motion. Brain: No evidence of acute territorial infarction, hemorrhage, hydrocephalus,extra-axial collection or mass lesion/mass effect. There is dilatation the ventricles and sulci consistent with age-related atrophy. Low-attenuation changes in the deep white matter consistent with  small vessel ischemia. Vascular: No hyperdense vessel or unexpected calcification. Skull: Postsurgical changes from a prior left occipital craniotomy are again noted. Sinuses/Orbits: The visualized paranasal sinuses and mastoid air cells are clear. The orbits and globes intact. Other: None Cervical spine: Alignment: Physiologic Skull base and vertebrae: Visualized skull base is intact. No atlanto-occipital dissociation. The vertebral body heights are well maintained. No fracture or pathologic osseous lesion seen. Soft tissues and spinal canal: The visualized paraspinal soft tissues are unremarkable. No prevertebral soft tissue swelling is seen. The spinal canal is grossly unremarkable, no large epidural collection or significant canal narrowing. Disc levels: Mild disc height loss with uncovertebral osteophytes and disc osteophyte complex is seen most notable at C4-C5. Upper chest: The lung apices are clear. Thoracic inlet is within normal limits. Other: None IMPRESSION: No acute intracranial abnormality, somewhat limited due to patient motion. Findings consistent with age related atrophy and chronic small vessel ischemia No acute fracture or malalignment of the spine. Electronically Signed   By: Prudencio Pair M.D.   On: 06/11/2019 00:38   Ct Knee Right Wo Contrast  Result Date: 06/11/2019 CLINICAL DATA:  Complex knee fractures. EXAM: CT OF THE right KNEE WITHOUT CONTRAST TECHNIQUE: Multidetector CT imaging of the right knee was performed according to the standard protocol. Multiplanar CT image reconstructions were also generated. COMPARISON:  Right knee radiographs 06/10/2019 FINDINGS: Oblique coursing mildly displaced fracture extending from the medial supracondylar region through the condylar notch. Maximum displacement along the articular surface is approximately 6 mm. Associated die punch type fracture involving the lateral tibial plateau with approximately 5 mm of depression and mild comminution. The  fracture extends out through the lateral cortex of the tibia near the metadiaphyseal junction. Associated moderate to large lipohemarthrosis is noted. Fairly significant underlying osteoporosis. The patella and fibula are intact. Grossly by CT the cruciate and collateral ligaments appear intact. The quadriceps and patellar tendons are intact. IMPRESSION: 1. Oblique coursing intra-articular medial femoral condylar fracture as detailed above. 2. Mildly depressed die punch type fracture involving the lateral tibial plateau. Electronically Signed   By: Marijo Sanes M.D.   On: 06/11/2019 06:55   Dg Chest Port 1 View  Result Date: 06/11/2019 CLINICAL DATA:  Preop, lung cancer EXAM: PORTABLE CHEST 1 VIEW COMPARISON:  September 13, 2018 FINDINGS: The cardiomediastinal silhouette is unchanged from prior exam. Aortic knob calcifications. Again noted is left hilar surgical clips and postsurgical changes. Elevation of the left hemidiaphragm. The right lung is clear. IMPRESSION: Stable postsurgical changes.  No acute cardiopulmonary process. Electronically Signed   By: Prudencio Pair M.D.   On: 06/11/2019 02:13   Dg Knee Complete 4 Views Right  Result Date: 06/11/2019 CLINICAL DATA:  Knee pain EXAM: RIGHT KNEE - COMPLETE 4+ VIEW COMPARISON:  None. FINDINGS: There is comminuted slightly impacted fracture seen through the intracondylar notch extending through the medial femoral condyle and medial metadiaphysis. There is also intra-articular mildly comminuted impacted  fracture seen through the lateral tibial plateau. A small lipohemarthrosis is seen. Prepatellar subcutaneous edema is noted. IMPRESSION: 1. Comminuted impacted intra-articular fracture through the intracondylar notch extending through the medial femoral condyle and femoral shaft. 2. Comminuted mildly impacted intra-articular fracture of the lateral tibial plateau. 3. Small lipohemarthrosis Electronically Signed   By: Prudencio Pair M.D.   On: 06/11/2019 00:13    Dg Hip Unilat W Or Wo Pelvis 2-3 Views Right  Result Date: 06/11/2019 CLINICAL DATA:  Hip pain after fall EXAM: DG HIP (WITH OR WITHOUT PELVIS) 2-3V RIGHT COMPARISON:  None. FINDINGS: There is no evidence of hip fracture or dislocation. 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 IMPRESSION: No acute osseous abnormality. Electronically Signed   By: Prudencio Pair M.D.   On: 06/11/2019 00:12        Scheduled Meds:  [MAR Hold] cephALEXin  500 mg Oral TID   chlorhexidine  60 mL Topical Once   [MAR Hold] donepezil  10 mg Oral QPM   [MAR Hold] osimertinib mesylate  80 mg Oral Daily   povidone-iodine  2 application Topical Once   Continuous Infusions:  sodium chloride 85 mL/hr at 06/11/19 0635   lactated ringers 10 mL/hr at 06/11/19 1028   [MAR Hold] methocarbamol (ROBAXIN) IV     tranexamic acid       LOS: 0 days     Vernell Leep, MD, FACP, [facility]. [facility]  To contact the attending provider between 7A-7P or the covering provider during after hours 7P-7A, please log into the web site [email or web address] and access using universal [place] password for that web site. If you do not have the password, please call the hospital operator.  06/11/2019, 11:58 AM

## 2019-06-11 NOTE — H&P (Signed)
History and Physical    Valerie [name] RCB:[id number] DOB: 25-Feb-1946 DOA: 06/10/2019  PCP: [name], MD   Patient coming from: Home   Chief Complaint: Fall with right knee pain   HPI: Valerie [name] is a 73 y.o. female with medical history significant for metastatic non-small cell lung cancer status post lobectomy, resection of brain tumor, history of DVT on Xarelto, dementia, and recurrent UTIs, now presenting to the emergency department with right knee pain after fall at home. She was having urinary urgency and frequency recently, was found to have UTI secondary to E. coli, and is completing a course of Keflex.  She has not had any fevers with this, feels that symptoms are improving, and had otherwise been in her usual state of health when she slipped while getting out of the shower this evening, fell without hitting her head, and experienced immediate and severe pain at the right knee.  She cannot bear weight.  She was treated with fentanyl by EMS and brought into the ED.  At her baseline, patient uses a walker to ambulate, is able to ascend 17 steps with some shortness of breath, but quickly recovers and never experiences chest pain.  ED Course: Upon arrival to the ED, patient is found to be afebrile, saturating well on room air, and with stable blood pressure.  Chemistry panel and CBC are unremarkable.  Noncontrast head CT is negative for acute intracranial abnormality and cervical spine CT is negative for acute fracture or malalignment.  Radiographs of the right leg demonstrate extra-articular distal femur fracture and lateral tibial plateau fracture.  Patient was treated with fentanyl in the ED.  COVID-19 testing is in process.  Orthopedic surgery was consulted by the ED physician and recommended medical admission to [facility] with the patient to be n.p.o.  Review of Systems:  All other systems reviewed and apart from HPI, are negative.  Past Medical History:    Diagnosis Date   Abnormal Pap smear 2006   Anal fistula    Ankle fracture    Anxiety    Arthritis    Atrophic vaginitis 2008   Cataract    Dementia ([place]) 2009   Dyspareunia 2008   H/O osteoporosis    H/O varicella    H/O vitamin D deficiency    Headache 07/24/2016   Heart murmur    History of measles, mumps, or rubella    History of radiation therapy 07/28/13- 08/10/13   right lung metastasis 5000 cGy 10 sessions   Hypertension    Lung cancer ([place]) dx'd 2002   Lung cancer ([place])    Lung cancer ([place])    Lung metastasis ([place])    PET scan 05/05/13, RUL lung nodule   Metastasis to brain [facility]) dx'd 2008   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   Nodule of right lung CT- 06/03/12   RIGHT UPPER LOBE   Nodule of right lung 06/03/12   Upper Lobe   On antineoplastic chemotherapy    TARCEVA   Osteoporosis 2010   Primary cancer of right upper lobe of lung ([place]) 04/22/2009   Qualifier: Diagnosis of  By: Nils Pyle CMA (AAMA), Leisha     Shortness of breath    hx lung ca    Status post chemotherapy 2003   CARBOPLATIN/PACLITAXEL /STATUS POST CLINICAL TRAIL OF CELEBREX AND IRESSA AT [facility] FOR 1 YEAR   Status post radiation therapy 2003   LEFT LUNG   Status post radiation therapy 11/07/2005  WHOLE BRAIN: DR Larkin In[facility]   Status post radiation therapy 06/02/2008   GAMMA KNIFE OF RESECTED CAVITAY   Thyroid adenoma    ?   Thyroid cancer ([place]) 10/18/11 bx   adenoid nodules    Yeast infection     Past Surgical History:  Procedure Laterality Date   BRAIN SURGERY     LEFT LOWER LOBECTOMY  05/2001   LEFT OCCIPITAL CRANIOTOMY  09/21/2005   tumor   LUNG LOBECTOMY     NECK SURGERY     ORIF ANKLE FRACTURE Left 10/02/2014   Procedure: OPEN REDUCTION INTERNAL FIXATION (ORIF) ANKLE FRACTURE;  Surgeon: Alta Corning, MD;  Location: [facility];  Service: Orthopedics;  Laterality: Left;   RADICAL NECK DISSECTION  10/18/2011   Procedure: RADICAL NECK  DISSECTION;  Surgeon: Izora Gala, MD;  Location: [place];  Service: ENT;  Laterality: Right;  RIGHT MODIFIED NECK DISSECTION /POSSIBLE RIGHT THYROIDECTOM   THYROIDECTOMY  10/18/2011   Procedure: THYROIDECTOMY WITH RADICAL NECK DISSECTION;  Surgeon: Izora Gala, MD;  Location: [place];  Service: ENT;  Laterality: Right;     reports that she has never smoked. She has never used smokeless tobacco. She reports that she does not drink alcohol or use drugs.  No Known Allergies  Family History  Problem Relation Age of Onset   Gait disorder Mother    Cancer Mother        Meningioma   Cancer Father        Pancreatic   Heart failure Sister    Cancer Maternal Aunt        menigeoma     Prior to Admission medications   Medication Sig Start Date End Date Taking? Authorizing Provider  Ascorbic Acid (VITAMIN C) 100 MG tablet Take 100 mg by mouth every evening.    Yes [provider]  cephALEXin (KEFLEX) 500 MG capsule Take 1 capsule (500 mg total) by mouth 3 (three) times daily. 06/04/19  Yes [name], MD  cholecalciferol (VITAMIN D) 1000 UNITS tablet Take 1,000 Units by mouth every evening.    Yes [provider]  donepezil (ARICEPT) 10 MG tablet Take 1 tablet (10 mg total) by mouth every evening. 01/13/19  Yes [name], MD  ibuprofen (ADVIL) 200 MG tablet Take 400 mg by mouth every 6 (six) hours as needed for moderate pain.   Yes [provider]  loperamide (IMODIUM) 2 MG capsule Take 2 mg by mouth as needed for diarrhea or loose stools.    Yes [provider]  MAGNESIUM PO Take 1 tablet by mouth daily.   Yes [provider]  Multiple Vitamin (MULTI-VITAMINS) TABS Take 1 tablet by mouth every evening.    Yes [provider]  TAGRISSO 80 MG tablet TAKE 1 TABLET (80MG ) BY MOUTH DAILY. Patient taking differently: Take 80 mg by mouth daily.  04/30/19  Yes Curt Bears, MD  vitamin B-12 (CYANOCOBALAMIN) 1000 MCG tablet Take 1,000  mcg by mouth every evening.   Yes [provider]  XARELTO 20 MG TABS tablet Take 1 tablet (20 mg total) by mouth every evening. 12/18/18  Yes [name], MD    Physical Exam: Vitals:   06/10/19 2309 06/10/19 2311 06/11/19 0007 06/11/19 0100  BP:  126/65 118/65 132/73  Pulse:  78 79 82  Resp:  19 19 15   Temp:  98.3 F (36.8 C)    TempSrc:  Oral    SpO2: 97% 97% 98% 98%  Weight:  78.5 kg  Height:  5\' 8"  (1.727 m)       Constitutional: NAD, calm  Eyes: PERTLA, lids and conjunctivae normal ENMT: Mucous membranes are moist. Posterior pharynx clear of any exudate or lesions.   Neck: normal, supple, no masses, no thyromegaly Respiratory: no wheezing, no crackles. Normal respiratory effort. No accessory muscle use.  Cardiovascular: S1 & S2 heard, regular rate and rhythm. No extremity edema.   Abdomen: No distension, no tenderness, soft. Bowel sounds active.  Musculoskeletal: no clubbing / cyanosis. No joint deformity upper and lower extremities.    Skin: no significant rashes, lesions, ulcers. Warm, dry, well-perfused. Neurologic: no gross facial asymmetry. Sensation intact. Moving all extremities.  Psychiatric: Alert and oriented to person, place, and situation. Pleasant, cooperative.    Labs on Admission: I have personally reviewed following labs and imaging studies  CBC: Recent Labs  Lab 06/10/19 2317 06/11/19 0016  WBC 7.2  --   NEUTROABS 5.9  --   HGB 11.3* 11.9*  HCT 37.1 35.0*  MCV 97.1  --   PLT 193  --    Basic Metabolic Panel: Recent Labs  Lab 06/11/19 0016  NA 141  K 3.6  CL 107  GLUCOSE 127*  BUN 20  CREATININE 1.00   GFR: Estimated Creatinine Clearance: 55.1 mL/min (by C-G formula based on SCr of 1 mg/dL). Liver Function Tests: No results for input(s): AST, ALT, ALKPHOS, BILITOT, PROT, ALBUMIN in the last 168 hours. No results for input(s): LIPASE, AMYLASE in the last 168 hours. No results for input(s): AMMONIA in the last 168  hours. Coagulation Profile: No results for input(s): INR, PROTIME in the last 168 hours. Cardiac Enzymes: No results for input(s): CKTOTAL, CKMB, CKMBINDEX, TROPONINI in the last 168 hours. BNP (last 3 results) No results for input(s): PROBNP in the last 8760 hours. HbA1C: No results for input(s): HGBA1C in the last 72 hours. CBG: No results for input(s): GLUCAP in the last 168 hours. Lipid Profile: No results for input(s): CHOL, HDL, LDLCALC, TRIG, CHOLHDL, LDLDIRECT in the last 72 hours. Thyroid Function Tests: No results for input(s): TSH, T4TOTAL, FREET4, T3FREE, THYROIDAB in the last 72 hours. Anemia Panel: No results for input(s): VITAMINB12, FOLATE, FERRITIN, TIBC, IRON, RETICCTPCT in the last 72 hours. Urine analysis:    Component Value Date/Time   COLORURINE YELLOW 09/13/2018 1503   APPEARANCEUR HAZY (A) 09/13/2018 1503   LABSPEC 1.026 09/13/2018 1503   PHURINE 5.0 09/13/2018 1503   GLUCOSEU NEGATIVE 09/13/2018 1503   HGBUR LARGE (A) 09/13/2018 1503   BILIRUBINUR negative 05/25/2019 1653   KETONESUR 20 (A) 09/13/2018 1503   PROTEINUR Negative 05/25/2019 1653   PROTEINUR 30 (A) 09/13/2018 1503   UROBILINOGEN 0.2 05/25/2019 1653   UROBILINOGEN 0.2 10/20/2014 1636   NITRITE negative 05/25/2019 1653   NITRITE NEGATIVE 09/13/2018 1503   LEUKOCYTESUR Trace (A) 05/25/2019 1653   Sepsis Labs: @LABRCNTIP (procalcitonin:4,lacticidven:4) )No results found for this or any previous visit (from the past 240 hour(s)).   Radiological Exams on Admission: Dg Tibia/fibula Right  Result Date: 06/11/2019 CLINICAL DATA:  Pain after fall EXAM: RIGHT TIBIA AND FIBULA - 2 VIEW COMPARISON:  None. FINDINGS: Comminuted impacted intra-articular fracture of the intracondylar notch and medial femoral condyle. There is also comminuted mildly impacted intra-articular fracture of the lateral tibial plateau. There is diffuse osteopenia. Prepatellar subcutaneous edema. IMPRESSION: Comminuted  fractures of the distal femur and lateral tibial plateau. Prepatellar subcutaneous edema. Diffuse osteopenia. Electronically Signed   By: Prudencio Pair M.D.   On: 06/11/2019  00:18   Ct Head Wo Contrast  Result Date: 06/11/2019 CLINICAL DATA:  Fall, on blood thinner EXAM: CT HEAD WITHOUT CONTRAST TECHNIQUE: Contiguous axial images were obtained from the base of the skull through the vertex without intravenous contrast. COMPARISON:  September 13, 2018 FINDINGS: The study is limited due to patient motion. Brain: No evidence of acute territorial infarction, hemorrhage, hydrocephalus,extra-axial collection or mass lesion/mass effect. There is dilatation the ventricles and sulci consistent with age-related atrophy. Low-attenuation changes in the deep white matter consistent with small vessel ischemia. Vascular: No hyperdense vessel or unexpected calcification. Skull: Postsurgical changes from a prior left occipital craniotomy are again noted. Sinuses/Orbits: The visualized paranasal sinuses and mastoid air cells are clear. The orbits and globes intact. Other: None Cervical spine: Alignment: Physiologic Skull base and vertebrae: Visualized skull base is intact. No atlanto-occipital dissociation. The vertebral body heights are well maintained. No fracture or pathologic osseous lesion seen. Soft tissues and spinal canal: The visualized paraspinal soft tissues are unremarkable. No prevertebral soft tissue swelling is seen. The spinal canal is grossly unremarkable, no large epidural collection or significant canal narrowing. Disc levels: Mild disc height loss with uncovertebral osteophytes and disc osteophyte complex is seen most notable at C4-C5. Upper chest: The lung apices are clear. Thoracic inlet is within normal limits. Other: None IMPRESSION: No acute intracranial abnormality, somewhat limited due to patient motion. Findings consistent with age related atrophy and chronic small vessel ischemia No acute fracture or  malalignment of the spine. Electronically Signed   By: Prudencio Pair M.D.   On: 06/11/2019 00:38   Ct Cervical Spine Wo Contrast  Result Date: 06/11/2019 CLINICAL DATA:  Fall, on blood thinner EXAM: CT HEAD WITHOUT CONTRAST TECHNIQUE: Contiguous axial images were obtained from the base of the skull through the vertex without intravenous contrast. COMPARISON:  September 13, 2018 FINDINGS: The study is limited due to patient motion. Brain: No evidence of acute territorial infarction, hemorrhage, hydrocephalus,extra-axial collection or mass lesion/mass effect. There is dilatation the ventricles and sulci consistent with age-related atrophy. Low-attenuation changes in the deep white matter consistent with small vessel ischemia. Vascular: No hyperdense vessel or unexpected calcification. Skull: Postsurgical changes from a prior left occipital craniotomy are again noted. Sinuses/Orbits: The visualized paranasal sinuses and mastoid air cells are clear. The orbits and globes intact. Other: None Cervical spine: Alignment: Physiologic Skull base and vertebrae: Visualized skull base is intact. No atlanto-occipital dissociation. The vertebral body heights are well maintained. No fracture or pathologic osseous lesion seen. Soft tissues and spinal canal: The visualized paraspinal soft tissues are unremarkable. No prevertebral soft tissue swelling is seen. The spinal canal is grossly unremarkable, no large epidural collection or significant canal narrowing. Disc levels: Mild disc height loss with uncovertebral osteophytes and disc osteophyte complex is seen most notable at C4-C5. Upper chest: The lung apices are clear. Thoracic inlet is within normal limits. Other: None IMPRESSION: No acute intracranial abnormality, somewhat limited due to patient motion. Findings consistent with age related atrophy and chronic small vessel ischemia No acute fracture or malalignment of the spine. Electronically Signed   By: Prudencio Pair M.D.    On: 06/11/2019 00:38   Dg Knee Complete 4 Views Right  Result Date: 06/11/2019 CLINICAL DATA:  Knee pain EXAM: RIGHT KNEE - COMPLETE 4+ VIEW COMPARISON:  None. FINDINGS: There is comminuted slightly impacted fracture seen through the intracondylar notch extending through the medial femoral condyle and medial metadiaphysis. There is also intra-articular mildly comminuted impacted fracture seen through  the lateral tibial plateau. A small lipohemarthrosis is seen. Prepatellar subcutaneous edema is noted. IMPRESSION: 1. Comminuted impacted intra-articular fracture through the intracondylar notch extending through the medial femoral condyle and femoral shaft. 2. Comminuted mildly impacted intra-articular fracture of the lateral tibial plateau. 3. Small lipohemarthrosis Electronically Signed   By: Prudencio Pair M.D.   On: 06/11/2019 00:13   Dg Hip Unilat W Or Wo Pelvis 2-3 Views Right  Result Date: 06/11/2019 CLINICAL DATA:  Hip pain after fall EXAM: DG HIP (WITH OR WITHOUT PELVIS) 2-3V RIGHT COMPARISON:  None. FINDINGS: There is no evidence of hip fracture or dislocation. 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 IMPRESSION: No acute osseous abnormality. Electronically Signed   By: Prudencio Pair M.D.   On: 06/11/2019 00:12    EKG: Independently reviewed. Sinus rhythm, non-specific inferolateral ST-abnormality appears similar to priors.   Assessment/Plan   1. Right distal femur and lateral tibial plateau fractures  - Presents with severe right knee pain after slipping and falling at home and is found to have fractures involving distal right femur and lateral tibial plateau  - Orthopedic surgery is consulting and much appreciated  - Based on the available data, Mrs. Bauer presents an estimated 1.4% risk of perioperative MI or cardiac arrest  - Ideally, she would skip 2 doses of Xarelto prior to surgery; alternatively, could consider treatment with  andexanet alpha  - Hold Xarelto, continue pain-control, CMS checks, NPO, gentle IVF hydration    2. Metastatic lung cancer  - Status-post lobectomy, resection of brain tumor, chemoradiation  - Currently managed with Tagrisso  - She will continue oncology follow-up   3. History of DVT  - No evidence for acute VTE  - Hold Xarelto, last dose was evening of 10/21   4. Dementia  - Continue Aricept    5. UTI secondary to E coli  - No systemic s/s of infection  - Complete course of Keflex    PPE: Mask, face shield  DVT prophylaxis: Xarelto pta, SCD's  Code Status: Full  Family Communication: Patient's husband, a physician, was updated at bedside   Consults called: Orthopedic surgery  Admission status: Inpatient     Vianne Bulls, MD [facility] Pager [phone]  If 7PM-7AM, please contact night-coverage [email or web address] Password [email or web address]  06/11/2019, 1:24 AM

## 2019-06-11 NOTE — Telephone Encounter (Signed)
No there isn't a pneumonia vaccination in [place]

## 2019-06-11 NOTE — ED Notes (Signed)
Ortho tech at bedside 

## 2019-06-11 NOTE — Consult Note (Signed)
Reason for Consult:Right distal femur fx Referring Physician: A Hongalgi  Valerie [name] is an 73 y.o. female.  HPI: Valerie [name] was getting out of the shower when she slipped and fell. She had immediate right knee pain and could not get up. She activated her LifeAlert and was brought to the ED. X-rays showed a distal femur fx and tibia plateau fx and orthopedic surgery was consulted. She lives at home with her husband and usually ambulates with a RW.  Past Medical History:  Diagnosis Date  . Abnormal Pap smear 2006  . Anal fistula   . Ankle fracture   . Anxiety   . Arthritis   . Atrophic vaginitis 2008  . Cataract   . Dementia ([place]) 2009  . Dyspareunia 2008  . H/O osteoporosis   . H/O varicella   . H/O vitamin D deficiency   . Headache 07/24/2016  . Heart murmur   . History of measles, mumps, or rubella   . History of radiation therapy 07/28/13- 08/10/13   right lung metastasis 5000 cGy 10 sessions  . Hypertension   . Lung cancer ([place]) dx'd 2002  . Lung cancer ([place])   . Lung cancer ([place])   . Lung metastasis ([place])    PET scan 05/05/13, RUL lung nodule  . 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 Nodule of right lung CT- 06/03/12   RIGHT UPPER LOBE  . Nodule of right lung 06/03/12   Upper Lobe  . On antineoplastic chemotherapy    TARCEVA  . Osteoporosis 2010  . Primary cancer of right upper lobe of lung ([place]) 04/22/2009   Qualifier: Diagnosis of  By: Nils Pyle CMA ([place]), Mearl Latin    . Shortness of breath    hx lung ca   . Status post chemotherapy 2003   CARBOPLATIN/PACLITAXEL /STATUS POST CLINICAL TRAIL OF CELEBREX AND IRESSA AT [facility] FOR 1 YEAR  . Status post radiation therapy 2003   LEFT LUNG  . Status post radiation therapy 11/07/2005   WHOLE BRAIN: DR Larkin [facility]  . Status post radiation therapy 06/02/2008   GAMMA KNIFE OF RESECTED CAVITAY  . Thyroid adenoma    ?  [name] Thyroid cancer ([place]) 10/18/11 bx   adenoid nodules   .  Yeast infection     Past Surgical History:  Procedure Laterality Date  . BRAIN SURGERY    . LEFT LOWER LOBECTOMY  05/2001  . LEFT OCCIPITAL CRANIOTOMY  09/21/2005   tumor  . LUNG LOBECTOMY    . NECK SURGERY    . ORIF ANKLE FRACTURE Left 10/02/2014   Procedure: OPEN REDUCTION INTERNAL FIXATION (ORIF) ANKLE FRACTURE;  Surgeon: Alta Corning, MD;  Location: [facility];  Service: Orthopedics;  Laterality: Left;  . RADICAL NECK DISSECTION  10/18/2011   Procedure: RADICAL NECK DISSECTION;  Surgeon: Izora Gala, MD;  Location: [facility];  Service: ENT;  Laterality: Right;  RIGHT MODIFIED NECK DISSECTION /POSSIBLE RIGHT THYROIDECTOM  . THYROIDECTOMY  10/18/2011   Procedure: THYROIDECTOMY WITH RADICAL NECK DISSECTION;  Surgeon: Izora Gala, MD;  Location[facility];  Service: ENT;  Laterality: Right;    Family History  Problem Relation Age of Onset  . Gait disorder Mother   . Cancer Mother        Meningioma  . Cancer Father        Pancreatic  . Heart failure Sister   . Cancer Maternal Aunt        menigeoma  Social History:  reports that she has never smoked. She has never used smokeless tobacco. She reports that she does not drink alcohol or use drugs.  Allergies: No Known Allergies  Medications: I have reviewed the patient's current medications.  Results for orders placed or performed during the hospital encounter of 06/10/19 (from the past 48 hour(s))  CBC with Differential/Platelet     Status: Abnormal   Collection Time: 06/10/19 11:17 PM  Result Value Ref Range   WBC 7.2 4.0 - 10.5 K/uL   RBC 3.82 (L) 3.87 - 5.11 MIL/uL   Hemoglobin 11.3 (L) 12.0 - 15.0 g/dL   HCT 37.1 36.0 - 46.0 %   MCV 97.1 80.0 - 100.0 fL   MCH 29.6 26.0 - 34.0 pg   MCHC 30.5 30.0 - 36.0 g/dL   RDW 13.5 11.5 - 15.5 %   Platelets 193 150 - 400 K/uL   nRBC 0.0 0.0 - 0.2 %   Neutrophils Relative % 83 %   Neutro Abs 5.9 1.7 - 7.7 K/uL   Lymphocytes Relative 8 %   Lymphs Abs 0.6 (L) 0.7 - 4.0 K/uL   Monocytes  Relative 8 %   Monocytes Absolute 0.6 0.1 - 1.0 K/uL   Eosinophils Relative 1 %   Eosinophils Absolute 0.1 0.0 - 0.5 K/uL   Basophils Relative 0 %   Basophils Absolute 0.0 0.0 - 0.1 K/uL   Immature Granulocytes 0 %   Abs Immature Granulocytes 0.03 0.00 - 0.07 K/uL    Comment: Performed at [facility], [place] [address] [place], [place] [address]  I-stat chem 8, ED (not at [facility] or [facility])     Status: Abnormal   Collection Time: 06/11/19 12:16 AM  Result Value Ref Range   Sodium 141 135 - 145 mmol/L   Potassium 3.6 3.5 - 5.1 mmol/L   Chloride 107 98 - 111 mmol/L   BUN 20 8 - 23 mg/dL   Creatinine, Ser 1.00 0.44 - 1.00 mg/dL   Glucose, Bld 127 (H) 70 - 99 mg/dL   Calcium, Ion 1.29 1.15 - 1.40 mmol/L   TCO2 26 22 - 32 mmol/L   Hemoglobin 11.9 (L) 12.0 - 15.0 g/dL   HCT 35.0 (L) 36.0 - 46.0 %  SARS Coronavirus 2 by RT PCR (hospital order, performed in [place] hospital lab) Nasopharyngeal Nasopharyngeal Swab     Status: None   Collection Time: 06/11/19 12:31 AM   Specimen: Nasopharyngeal Swab  Result Value Ref Range   SARS Coronavirus 2 NEGATIVE NEGATIVE    Comment: (NOTE) If result is NEGATIVE SARS-CoV-2 target nucleic acids are NOT DETECTED. The SARS-CoV-2 RNA is generally detectable in upper and lower  respiratory specimens during the acute phase of infection. The lowest  concentration of SARS-CoV-2 viral copies this assay can detect is 250  copies / mL. A negative result does not preclude SARS-CoV-2 infection  and should not be used as the sole basis for treatment or other  patient management decisions.  A negative result may occur with  improper specimen collection / handling, submission of specimen other  than nasopharyngeal swab, presence of viral mutation(s) within the  areas targeted by this assay, and inadequate number of viral copies  (<250 copies / mL). A negative result must be combined with clinical  observations, patient history, and epidemiological  information. If result is POSITIVE SARS-CoV-2 target nucleic acids are DETECTED. The SARS-CoV-2 RNA is generally detectable in upper and lower  respiratory specimens dur ing the  acute phase of infection.  Positive  results are indicative of active infection with SARS-CoV-2.  Clinical  correlation with patient history and other diagnostic information is  necessary to determine patient infection status.  Positive results do  not rule out bacterial infection or co-infection with other viruses. If result is PRESUMPTIVE POSTIVE SARS-CoV-2 nucleic acids MAY BE PRESENT.   A presumptive positive result was obtained on the submitted specimen  and confirmed on repeat testing.  While 2019 novel coronavirus  (SARS-CoV-2) nucleic acids may be present in the submitted sample  additional confirmatory testing may be necessary for epidemiological  and / or clinical management purposes  to differentiate between  SARS-CoV-2 and other Sarbecovirus currently known to infect humans.  If clinically indicated additional testing with an alternate test  methodology [phone]) is advised. The SARS-CoV-2 RNA is generally  detectable in upper and lower respiratory sp ecimens during the acute  phase of infection. The expected result is Negative. Fact Sheet for Patients:  StrictlyIdeas.no Fact Sheet for Healthcare Providers: BankingDealers.co.za This test is not yet approved or cleared by the [place] FDA and has been authorized for detection and/or diagnosis of SARS-CoV-2 by FDA under an Emergency Use Authorization (EUA).  This EUA will remain in effect (meaning this test can be used) for the duration of the COVID-19 declaration under Section 564(b)(1) of the Act, 21 U.S.C. section 360bbb-3(b)(1), unless the authorization is terminated or revoked sooner. Performed at [facility], [place] [address], [place], [place] [address]   CBC     Status:  Abnormal   Collection Time: 06/11/19  5:42 AM  Result Value Ref Range   WBC 8.3 4.0 - 10.5 K/uL   RBC 3.41 (L) 3.87 - 5.11 MIL/uL   Hemoglobin 10.4 (L) 12.0 - 15.0 g/dL   HCT 33.1 (L) 36.0 - 46.0 %   MCV 97.1 80.0 - 100.0 fL   MCH 30.5 26.0 - 34.0 pg   MCHC 31.4 30.0 - 36.0 g/dL   RDW 13.6 11.5 - 15.5 %   Platelets 163 150 - 400 K/uL   nRBC 0.0 0.0 - 0.2 %    Comment: Performed at [facility], [place] [address], [place], [place] [address]  Basic metabolic panel     Status: Abnormal   Collection Time: 06/11/19  5:42 AM  Result Value Ref Range   Sodium 139 135 - 145 mmol/L   Potassium 4.2 3.5 - 5.1 mmol/L   Chloride 107 98 - 111 mmol/L   CO2 24 22 - 32 mmol/L   Glucose, Bld 137 (H) 70 - 99 mg/dL   BUN 19 8 - 23 mg/dL   Creatinine, Ser 0.96 0.44 - 1.00 mg/dL   Calcium 9.4 8.9 - 10.3 mg/dL   GFR calc non Af Amer 59 (L) >60 mL/min   GFR calc Af Amer >60 >60 mL/min   Anion gap 8 5 - 15    Comment: Performed at [facility], [place] [address], [place], [place] [address]  Type and screen [place]     Status: None   Collection Time: 06/11/19  5:42 AM  Result Value Ref Range   ABO/RH(D) A NEG    Antibody Screen NEG    Sample Expiration      06/14/2019,2359 Performed at [facility], [place] [address] [place], [place] [address]   ABO/Rh     Status: None   Collection Time: 06/11/19  5:44 AM  Result Value Ref  Range   ABO/RH(D)      A NEG Performed at [place] [address], [place], [place] [address]     Dg Tibia/fibula Right  Result Date: 06/11/2019 CLINICAL DATA:  Pain after fall EXAM: RIGHT TIBIA AND FIBULA - 2 VIEW COMPARISON:  None. FINDINGS: Comminuted impacted intra-articular fracture of the intracondylar notch and medial femoral condyle. There is also comminuted mildly impacted intra-articular fracture of the lateral tibial plateau. There is diffuse osteopenia. Prepatellar  subcutaneous edema. IMPRESSION: Comminuted fractures of the distal femur and lateral tibial plateau. Prepatellar subcutaneous edema. Diffuse osteopenia. Electronically Signed   By: Prudencio Pair M.D.   On: 06/11/2019 00:18   Ct Head Wo Contrast  Result Date: 06/11/2019 CLINICAL DATA:  Fall, on blood thinner EXAM: CT HEAD WITHOUT CONTRAST TECHNIQUE: Contiguous axial images were obtained from the base of the skull through the vertex without intravenous contrast. COMPARISON:  September 13, 2018 FINDINGS: The study is limited due to patient motion. Brain: No evidence of acute territorial infarction, hemorrhage, hydrocephalus,extra-axial collection or mass lesion/mass effect. There is dilatation the ventricles and sulci consistent with age-related atrophy. Low-attenuation changes in the deep white matter consistent with small vessel ischemia. Vascular: No hyperdense vessel or unexpected calcification. Skull: Postsurgical changes from a prior left occipital craniotomy are again noted. Sinuses/Orbits: The visualized paranasal sinuses and mastoid air cells are clear. The orbits and globes intact. Other: None Cervical spine: Alignment: Physiologic Skull base and vertebrae: Visualized skull base is intact. No atlanto-occipital dissociation. The vertebral body heights are well maintained. No fracture or pathologic osseous lesion seen. Soft tissues and spinal canal: The visualized paraspinal soft tissues are unremarkable. No prevertebral soft tissue swelling is seen. The spinal canal is grossly unremarkable, no large epidural collection or significant canal narrowing. Disc levels: Mild disc height loss with uncovertebral osteophytes and disc osteophyte complex is seen most notable at C4-C5. Upper chest: The lung apices are clear. Thoracic inlet is within normal limits. Other: None IMPRESSION: No acute intracranial abnormality, somewhat limited due to patient motion. Findings consistent with age related atrophy and chronic  small vessel ischemia No acute fracture or malalignment of the spine. Electronically Signed   By: Prudencio Pair M.D.   On: 06/11/2019 00:38   Ct Cervical Spine Wo Contrast  Result Date: 06/11/2019 CLINICAL DATA:  Fall, on blood thinner EXAM: CT HEAD WITHOUT CONTRAST TECHNIQUE: Contiguous axial images were obtained from the base of the skull through the vertex without intravenous contrast. COMPARISON:  September 13, 2018 FINDINGS: The study is limited due to patient motion. Brain: No evidence of acute territorial infarction, hemorrhage, hydrocephalus,extra-axial collection or mass lesion/mass effect. There is dilatation the ventricles and sulci consistent with age-related atrophy. Low-attenuation changes in the deep white matter consistent with small vessel ischemia. Vascular: No hyperdense vessel or unexpected calcification. Skull: Postsurgical changes from a prior left occipital craniotomy are again noted. Sinuses/Orbits: The visualized paranasal sinuses and mastoid air cells are clear. The orbits and globes intact. Other: None Cervical spine: Alignment: Physiologic Skull base and vertebrae: Visualized skull base is intact. No atlanto-occipital dissociation. The vertebral body heights are well maintained. No fracture or pathologic osseous lesion seen. Soft tissues and spinal canal: The visualized paraspinal soft tissues are unremarkable. No prevertebral soft tissue swelling is seen. The spinal canal is grossly unremarkable, no large epidural collection or significant canal narrowing. Disc levels: Mild disc height loss with uncovertebral osteophytes and disc osteophyte complex is seen most notable at C4-C5. Upper chest: The  lung apices are clear. Thoracic inlet is within normal limits. Other: None IMPRESSION: No acute intracranial abnormality, somewhat limited due to patient motion. Findings consistent with age related atrophy and chronic small vessel ischemia No acute fracture or malalignment of the spine.  Electronically Signed   By: Prudencio Pair M.D.   On: 06/11/2019 00:38   Ct Knee Right Wo Contrast  Result Date: 06/11/2019 CLINICAL DATA:  Complex knee fractures. EXAM: CT OF THE right KNEE WITHOUT CONTRAST TECHNIQUE: Multidetector CT imaging of the right knee was performed according to the standard protocol. Multiplanar CT image reconstructions were also generated. COMPARISON:  Right knee radiographs 06/10/2019 FINDINGS: Oblique coursing mildly displaced fracture extending from the medial supracondylar region through the condylar notch. Maximum displacement along the articular surface is approximately 6 mm. Associated die punch type fracture involving the lateral tibial plateau with approximately 5 mm of depression and mild comminution. The fracture extends out through the lateral cortex of the tibia near the metadiaphyseal junction. Associated moderate to large lipohemarthrosis is noted. Fairly significant underlying osteoporosis. The patella and fibula are intact. Grossly by CT the cruciate and collateral ligaments appear intact. The quadriceps and patellar tendons are intact. IMPRESSION: 1. Oblique coursing intra-articular medial femoral condylar fracture as detailed above. 2. Mildly depressed die punch type fracture involving the lateral tibial plateau. Electronically Signed   By: Marijo Sanes M.D.   On: 06/11/2019 06:55   Dg Chest Port 1 View  Result Date: 06/11/2019 CLINICAL DATA:  Preop, lung cancer EXAM: PORTABLE CHEST 1 VIEW COMPARISON:  September 13, 2018 FINDINGS: The cardiomediastinal silhouette is unchanged from prior exam. Aortic knob calcifications. Again noted is left hilar surgical clips and postsurgical changes. Elevation of the left hemidiaphragm. The right lung is clear. IMPRESSION: Stable postsurgical changes.  No acute cardiopulmonary process. Electronically Signed   By: Prudencio Pair M.D.   On: 06/11/2019 02:13   Dg Knee Complete 4 Views Right  Result Date: 06/11/2019 CLINICAL  DATA:  Knee pain EXAM: RIGHT KNEE - COMPLETE 4+ VIEW COMPARISON:  None. FINDINGS: There is comminuted slightly impacted fracture seen through the intracondylar notch extending through the medial femoral condyle and medial metadiaphysis. There is also intra-articular mildly comminuted impacted fracture seen through the lateral tibial plateau. A small lipohemarthrosis is seen. Prepatellar subcutaneous edema is noted. IMPRESSION: 1. Comminuted impacted intra-articular fracture through the intracondylar notch extending through the medial femoral condyle and femoral shaft. 2. Comminuted mildly impacted intra-articular fracture of the lateral tibial plateau. 3. Small lipohemarthrosis Electronically Signed   By: Prudencio Pair M.D.   On: 06/11/2019 00:13   Dg Hip Unilat W Or Wo Pelvis 2-3 Views Right  Result Date: 06/11/2019 CLINICAL DATA:  Hip pain after fall EXAM: DG HIP (WITH OR WITHOUT PELVIS) 2-3V RIGHT COMPARISON:  None. FINDINGS: There is no evidence of hip fracture or dislocation. 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 IMPRESSION: No acute osseous abnormality. Electronically Signed   By: Prudencio Pair M.D.   On: 06/11/2019 00:12    Review of Systems  Constitutional: Negative for weight loss.  HENT: Negative for ear discharge, ear pain, hearing loss and tinnitus.   Eyes: Negative for blurred vision, double vision, photophobia and pain.  Respiratory: Negative for cough, sputum production and shortness of breath.   Cardiovascular: Negative for chest pain.  Gastrointestinal: Negative for abdominal pain, nausea and vomiting.  Genitourinary: Negative for dysuria, flank pain, frequency and urgency.  Musculoskeletal: Positive for joint pain (  Right knee). Negative for back pain, falls, myalgias and neck pain.  Neurological: Negative for dizziness, tingling, sensory change, focal weakness, loss of consciousness and headaches.  Endo/Heme/Allergies: Does not  bruise/bleed easily.  Psychiatric/Behavioral: Negative for depression, memory loss and substance abuse. The patient is not nervous/anxious.    Blood pressure (!) 108/54, pulse 69, temperature 98.3 F (36.8 C), temperature source Oral, resp. rate 20, height 5\' 8"  (1.727 m), weight 78.5 kg, SpO2 98 %. Physical Exam  Constitutional: She appears well-developed and well-nourished. No distress.  HENT:  Head: Normocephalic and atraumatic.  Eyes: Conjunctivae are normal. Right eye exhibits no discharge. Left eye exhibits no discharge. No scleral icterus.  Neck: Normal range of motion.  Cardiovascular: Normal rate and regular rhythm.  Respiratory: Effort normal. No respiratory distress.  Musculoskeletal:     Comments: RLE No traumatic wounds, ecchymosis, or rash  KI in place  No ankle effusion  Sens DPN, SPN, TN intact  Motor EHL, ext, flex, evers 5/5  DP 1+, PT 0, No significant edema  Neurological: She is alert.  Skin: Skin is warm and dry. She is not diaphoretic.  Psychiatric: She has a normal mood and affect. Her behavior is normal.    Assessment/Plan: Right distal femur fx -- Plan ORIF today by Dr. Percell Miller. NPO until then. Right tibia plateau fx -- Plan non-operative management Multiple medical problems including metastatic non-small cell lung cancer status post lobectomy, resection of brain tumor, history of DVT on Xarelto, dementia, and recurrent UTIs -- per primary service    Lisette Abu, PA-C Orthopedic Surgery [phone] 06/11/2019, 10:12 AM

## 2019-06-11 NOTE — ED Notes (Signed)
Pt transported to CT ?

## 2019-06-11 NOTE — Interval H&P Note (Signed)
I participated in the care of this patient and agree with the above history, physical and evaluation. I performed a review of the history and a physical exam as detailed   Timothy Daniel Murphy MD  

## 2019-06-12 DIAGNOSIS — D62 Acute posthemorrhagic anemia: Secondary | ICD-10-CM

## 2019-06-12 DIAGNOSIS — S82121D Displaced fracture of lateral condyle of right tibia, subsequent encounter for closed fracture with routine healing: Secondary | ICD-10-CM

## 2019-06-12 DIAGNOSIS — N390 Urinary tract infection, site not specified: Secondary | ICD-10-CM

## 2019-06-12 LAB — CBC
HCT: 30.2 % — ABNORMAL LOW (ref 36.0–46.0)
Hemoglobin: 9.2 g/dL — ABNORMAL LOW (ref 12.0–15.0)
MCH: 29.8 pg (ref 26.0–34.0)
MCHC: 30.5 g/dL (ref 30.0–36.0)
MCV: 97.7 fL (ref 80.0–100.0)
Platelets: 166 10*3/uL (ref 150–400)
RBC: 3.09 MIL/uL — ABNORMAL LOW (ref 3.87–5.11)
RDW: 13.8 % (ref 11.5–15.5)
WBC: 7.5 10*3/uL (ref 4.0–10.5)
nRBC: 0 % (ref 0.0–0.2)

## 2019-06-12 LAB — BASIC METABOLIC PANEL
Anion gap: 8 (ref 5–15)
BUN: 10 mg/dL (ref 8–23)
CO2: 23 mmol/L (ref 22–32)
Calcium: 8.6 mg/dL — ABNORMAL LOW (ref 8.9–10.3)
Chloride: 106 mmol/L (ref 98–111)
Creatinine, Ser: 0.71 mg/dL (ref 0.44–1.00)
GFR calc Af Amer: >60 mL/min (ref >60)
GFR calc non Af Amer: >60 mL/min (ref >60)
Glucose, Bld: 144 mg/dL — ABNORMAL HIGH (ref 70–99)
Potassium: 3.8 mmol/L (ref 3.5–5.1)
Sodium: 137 mmol/L (ref 135–145)

## 2019-06-12 MED ORDER — ADULT MULTIVITAMIN W/MINERALS CH
1.0000 | ORAL_TABLET | Freq: Every day | ORAL | Status: DC
  Administered 2019-06-12 – 2019-06-17 (×6): 1 via ORAL
  Filled 2019-06-12 (×7): qty 1

## 2019-06-12 MED ORDER — ENSURE ENLIVE PO LIQD
237.0000 mL | Freq: Two times a day (BID) | ORAL | Status: DC
  Administered 2019-06-12 – 2019-06-16 (×9): 237 mL via ORAL

## 2019-06-12 NOTE — Progress Notes (Signed)
Occupational Therapy Evaluation Patient Details Name: Valerie [name] MRN: [id number] DOB: 1946/04/19 Today's Date: 06/12/2019    History of Present Illness Pt is a 73 y/o female admitted after falling at home in which she sustained a R distal femur fx and R tibial plateau fx. Pt is now s/p ORIF R LE and NWB R LE. PMh including but not limited to dementia, metastatic NSCL cancer and hx of resection of a brain tumor.   Clinical Impression   Pt with decline in function and safety with ADLs and ADL mobility with impaired strength, balance, endurance and cognition. Pt reports that PTA, she lived at home with her husband and was independent with ADLs/selfcare and used a RW for mobility. Pt currently requires max - total A +2 for bed mobility and for sit to stand to RW and 2 person HHA, total A with LB ADLs, mod A with UB ADLs and had Poor sitting balance at EOB requiring mod A for balance/support. Pt would benefit from acute OT services to address impairments to maximize level of function and safety    Follow Up Recommendations  SNF    Equipment Recommendations  Other (comment)(TBD at next venue of care)    Recommendations for Other Services       Precautions / Restrictions Precautions Precautions: Fall Restrictions Weight Bearing Restrictions: Yes RLE Weight Bearing: Non weight bearing      Mobility Bed Mobility Overal bed mobility: Needs Assistance Bed Mobility: Supine to Sit;Sit to Supine     Supine to sit: Max assist;+2 for physical assistance;HOB elevated Sit to supine: Total assist;+2 for physical assistance   General bed mobility comments: increased time and effort, pt able to use R UE on bed rail and initiate movement of L LE; heavy physical assistance of two needed with use of bed pads to position pt's hips at EOB  Transfers Overall transfer level: Needs assistance Equipment used: Rolling walker (2 wheeled);2 person hand held assist Transfers: Sit to/from  Stand Sit to Stand: Max assist;Total assist;+2 physical assistance;From elevated surface         General transfer comment: initially attempted to stand from EOB with RW and needed total A x2 with minimal clearance of buttocks from bed; second attempt [facility] was utilized with max A x2 and with a bit more clearance of buttocks from bed; however, unable to achieve full upright standing position    Balance Overall balance assessment: Needs assistance;History of Falls Sitting-balance support: Feet supported;Bilateral upper extremity supported Sitting balance-Leahy Scale: Poor Sitting balance - Comments: min-max A needed to maintain upright sitting position at EOB Postural control: Posterior lean Standing balance support: Bilateral upper extremity supported Standing balance-Leahy Scale: Zero Standing balance comment: total A                           ADL either performed or assessed with clinical judgement   ADL Overall ADL's : Needs assistance/impaired Eating/Feeding: Independent;Sitting   Grooming: Wash/dry hands;Wash/dry face;Sitting;Minimal assistance Grooming Details (indicate cue type and reason): Poor sitting balance Upper Body Bathing: Moderate assistance   Lower Body Bathing: Total assistance   Upper Body Dressing : Moderate assistance   Lower Body Dressing: Total assistance     Toilet Transfer Details (indicate cue type and reason): attempted sit - stand x 2 from bed for [facility] transfer, however pt unable Toileting- Clothing Manipulation and Hygiene: Total assistance;Bed level       Functional mobility during ADLs: Maximal assistance;Total assistance;+2  for physical assistance General ADL Comments: max - total A +2 to sit EOB and for sit - stand     Vision Baseline Vision/History: Wears glasses Wears Glasses: At all times Patient Visual Report: No change from baseline       Perception     Praxis      Pertinent Vitals/Pain Pain Assessment: 0-10 Pain  Score: 8  Pain Location: R LE Pain Descriptors / Indicators: Guarding;Grimacing Pain Intervention(s): Limited activity within patient's tolerance;Monitored during session;Repositioned     Hand Dominance Right   Extremity/Trunk Assessment Upper Extremity Assessment Upper Extremity Assessment: Generalized weakness   Lower Extremity Assessment Lower Extremity Assessment: Defer to PT evaluation RLE Deficits / Details: pt with decreased strength and ROM limitations secondary to post-op pain and weakness. RLE: Unable to fully assess due to pain   Cervical / Trunk Assessment Cervical / Trunk Assessment: Kyphotic   Communication Communication Communication: No difficulties   Cognition Arousal/Alertness: Awake/alert Behavior During Therapy: Flat affect Overall Cognitive Status: Impaired/Different from baseline Area of Impairment: Memory;Following commands;Safety/judgement;Problem solving                     Memory: Decreased recall of precautions Following Commands: Follows one step commands with increased time;Follows one step commands inconsistently Safety/Judgement: Decreased awareness of deficits;Decreased awareness of safety   Problem Solving: Slow processing;Decreased initiation;Difficulty sequencing;Requires verbal cues;Requires tactile cues     General Comments       Exercises     Shoulder Instructions      Home Living Family/patient expects to be discharged to:: Private residence Living Arrangements: Spouse/significant other Available Help at Discharge: Family Type of Home: House Home Access: Stairs to enter [employer] of Steps: 3   Home Layout: Two level;Able to live on main level with bedroom/bathroom     Bathroom Shower/Tub: Occupational psychologist: Standard     Home Equipment: Walker - 4 wheels;Grab bars - tub/shower;Bedside commode          Prior Functioning/Environment Level of Independence: Independent with assistive  device(s)        Comments: ambulated with use of rollator when outside of her home; no AD needed to ambulate in the home, independent with ADLs/selfcare        OT Problem List: Decreased strength;Impaired balance (sitting and/or standing);Decreased cognition;Pain;Decreased activity tolerance;Decreased knowledge of use of DME or AE      OT Treatment/Interventions: Self-care/ADL training;DME and/or AE instruction;Therapeutic activities;Patient/family education    OT Goals(Current goals can be found in the care plan section) Acute Rehab OT Goals Patient Stated Goal: decrease pain OT Goal Formulation: With patient Time For Goal Achievement: 06/26/19 Potential to Achieve Goals: Good ADL Goals Pt Will Perform Grooming: with min guard assist;with supervision;with set-up;sitting Pt Will Perform Upper Body Bathing: with min assist;sitting Pt Will Perform Lower Body Bathing: with max assist;with mod assist;sitting/lateral leans Pt Will Perform Upper Body Dressing: with min assist;sitting Pt Will Transfer to Toilet: with max assist;with +2 assist;stand pivot transfer;bedside commode Additional ADL Goal #1: pt will complete bed mobility with max A to sit EOB for ADLs/selfcare with min A for balance/support  OT Frequency: Min 2X/week   Barriers to D/C: Decreased caregiver support          Co-evaluation PT/OT/SLP Co-Evaluation/Treatment: Yes Reason for Co-Treatment: For patient/therapist safety;To address functional/ADL transfers PT goals addressed during session: Mobility/safety with mobility;Balance;Proper use of DME;Strengthening/ROM        AM-PAC OT "6 Clicks" Daily Activity  Outcome Measure Help from another person eating meals?: None Help from another person taking care of personal grooming?: A Little Help from another person toileting, which includes using toliet, bedpan, or urinal?: Total Help from another person bathing (including washing, rinsing, drying)?: Total Help  from another person to put on and taking off regular upper body clothing?: A Lot Help from another person to put on and taking off regular lower body clothing?: Total 6 Click Score: 12   End of Session Equipment Utilized During Treatment: Gait belt;Rolling walker  Activity Tolerance: No increased pain;Patient limited by fatigue Patient left: in bed;with call bell/phone within reach;with nursing/sitter in room  OT Visit Diagnosis: Unsteadiness on feet (R26.81);Other abnormalities of gait and mobility (R26.89);Muscle weakness (generalized) (M62.81);History of falling (Z91.81);Other symptoms and signs involving cognitive function;Pain Pain - Right/Left: Right Pain - part of body: Hip;Leg                Time: 1025-1050 OT Time Calculation (min): 25 min Charges:  OT General Charges $OT Visit: 1 Visit OT Evaluation $OT Eval Moderate Complexity: 1 Mod    Britt Bottom 06/12/2019, 12:07 PM

## 2019-06-12 NOTE — Care Management (Signed)
CM consult acknowledged to assist with any HH/DME needs. Awaiting PT/OT eval for DCP recommendations and will continue to follow.  Natalie Gay RN, BSN, NCM-BC, ACM-RN [phone] 

## 2019-06-12 NOTE — Plan of Care (Signed)
  Problem: Pain Managment: Goal: General experience of comfort will improve Outcome: Progressing   

## 2019-06-12 NOTE — Op Note (Signed)
06/11/2019  9:33 AM  PATIENT:  Valerie [name]    PRE-OPERATIVE DIAGNOSIS:  right distal femur fracture  POST-OPERATIVE DIAGNOSIS:  Same  PROCEDURE:  OPEN REDUCTION INTERNAL FIXATION (ORIF) DISTAL FEMUR FRACTURE, Open Reduction Internal Fixation (Orif) Tibial Plateau  SURGEON:  Renette Butters, MD  ASSISTANT: Roxan Hockey, PA-C, he was present and scrubbed throughout the case, critical for completion in a timely fashion, and for retraction, instrumentation, and closure.   ANESTHESIA:   gen  PREOPERATIVE INDICATIONS:  Valerie [name] is a  73 y.o. female with a diagnosis of right distal femur fracture who failed conservative measures and elected for surgical management.    The risks benefits and alternatives were discussed with the patient preoperatively including but not limited to the risks of infection, bleeding, nerve injury, cardiopulmonary complications, the need for revision surgery, among others, and the patient was willing to proceed.  OPERATIVE IMPLANTS: [employer] distal femur locking plate  OPERATIVE FINDINGS: supracondylar fracture  BLOOD LOSS: 989  COMPLICATIONS: none  TOURNIQUET TIME: 153min  OPERATIVE PROCEDURE:  Patient was identified in the preoperative holding area and site was marked by me She was transported to the operating theater and placed on the table in supine position taking care to pad all bony prominences. After a preincinduction time out anesthesia was induced. The right lower extremity was prepped and draped in normal sterile fashion and a pre-incision timeout was performed. She received ancef for preoperative antibiotics.   Made a lateral incision in a curvilinear fashion over her distal femur and proximal tibia I dissected down to her IT band I incised this longitudinally and then elevated the anterior aspect of the tibia and performed an anterior compartment fasciotomy.  Next I performed a submeniscal arthrotomy she had capsular avulsion  of her lateral meniscus I placed a stitch across this repairing that.  Next I used a Cobb to reduce her fracture fragment and pinned this in the place  Next I placed a lateral locking plate and placed multiple screws across this fracture securing it is very happy with final x-rays of this.  Next I turned my attention to to her medial femoral condyle fracture I made a medial incision and dissected down to her medial femoral condyle I was able to reduce this and pinned it into place and then placed 2 cannulated screws with washers securing this I was able to examine the articular reduction through her lateral incision.   X-rays I examined and identified that she did have an extension this was not just transcondylar but supracondylar fracture as well I elected to place a lateral locking plate at this point.  I carried the lateral incision more proximally and continued the split of the IPT band proximally. I then used a Cobb to elevate the muscle off of the lateral femur and the fracture was identified immediately.  I then debrided the fracture site and a performed a reduction maneuver I selected the appropriate length locking plate for the distal femur and pinned this into place with a K wire holding the fracture reduced as well.  I then placed a combination of locking and nonlocking screws to help with fracture reduction and stabilization in the proximal aspect of the plate as well as the distal aspect of the plate. I uses many distal locking screws as possible. Care was taken to not penetrate the distal end of the bone.   Had good bites of my locking screws proximal bicortical screws had good bites the bone was  soft distally.  I then took multiple fluoroscopic views and was happy with the reduction and placement of all hardware. I then thoroughly irrigated the wound and closed the IT band followed by the skin in layers with absorbable stitch.  POST OPERATIVE PLAN: Knee immobilizer full-time,  nonweightbearing. dvt px: scd's/TED's, and mobilization and chemical px as indicated

## 2019-06-12 NOTE — Plan of Care (Addendum)
Pt's [place] was counted with both pt and pt's husband then taken to pharmacy.   Pt's husband stated their daughters would like to visit pt while here. Visitor policy explained. Husband stated one daughter would come tomorrow & that he was told visitor policy was same visitor per day. Explained that if daughter came tomorrow she would have to stay the designated visitor for remainder of pt's stay.   Problem: Education: Goal: Knowledge of General Education information will improve Description: Including pain rating scale, medication(s)/side effects and non-pharmacologic comfort measures Outcome: Progressing   Problem: Coping: Goal: Level of anxiety will decrease Outcome: Progressing   Problem: Pain Managment: Goal: General experience of comfort will improve Outcome: Progressing   Problem: Safety: Goal: Ability to remain free from injury will improve Outcome: Progressing

## 2019-06-12 NOTE — Progress Notes (Addendum)
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 coli UTI.  She initially presented to [facility] ED due to acute onset of right knee pain after she slipped at home while getting out of the shower and fell without hitting her head.  She was admitted for right distal femur fracture and right tibial plateau fracture.  Orthopedics was consulted and advised transferring to [facility] for surgery. Last dose of Xarelto per patient was on 10/21 at approximately 8 PM.  Xarelto held on admission.  Patient underwent surgical repair of the right distal femur fracture afternoon of 10/22.  Tibial plateau fracture nonoperative.  Postoperatively has had significant pain.  Patient is to remain nonweightbearing with brace on at all times.  At baseline patient ambulates with a rolling walker as needed and is independent in ADLs.  PT/OT evaluated patient today (10/23) and recommend SNF on discharge as patient is max assist x2 for bed mobility and transfers.  Social work spoke with patient she declined SNF and wishes to be discharged home due to [place].  Will have needs for [facility] lift, hospital bed, wheelchair.  Patient advised may require more assistance than husband can handle.  Case management working on home health services.  Follow-up with Dr. Percell Miller in clinic in 10 to 14 days.  Of note, in the ED reported she had been having urinary urgency and frequency recently, was found to have UTI secondary to E. coli, and is completing a course of Keflex.  On chart review most recent UA and culture from 10/5, showing pansensitive E. coli as well as Enterococcus.  Patient currently denies symptoms, but will cover for  Enterococcus if symptoms recur.  Subjective: Patient seen and examined, asleep sitting up in bed, awoke easily.  She reports 8 out of 10 pain in her left leg.  States Tylenol not very helpful.  Slept intermittently due to pain.  Denies fevers or chills, chest pain, shortness of breath, nausea vomiting or diarrhea.  Assessment & Plan:   Principal Problem:   Closed fracture of distal end of right femur, initial encounter [facility]) Active Problems:   Closed fracture of lateral portion of right tibial plateau   Primary cancer of right upper lobe of lung ([place])   Lower urinary tract infectious disease   History of DVT of lower extremity   Acute blood loss anemia   Dementia (HCC)   Right distal femur fracture  Right tibial plateau fracture due to Mechanical fall - status post ORIF distal femur fracture on 10/22 by Dr. Percell Miller.  CTs of head and C-spine without acute abnormalities.  Evaluated by PT 10/23, noted to be max assist x2 with all bed mobility and transfers.  SNF recommended on discharge, however patient declines and wants to return home with home health services.   -Pain management:    Tylenol as needed for mild pain   Norco as needed for moderate pain   Morphine 10 mg IV as needed for severe pain -Bowel regimen -Discharge planning ongoing  Acute blood loss anemia -hemoglobin 13.3 last month, 11.3 on presentation, and 9.2 this morning postop.  No sign of active ongoing bleeding at this time. -Follow-up CBC in the morning -We will transfuse  if hemoglobin less than 7  E. coli/Enterococcus faecalis UTI -improved, patient asymptomatic at this time.  Continue Keflex for E. coli coverage.  If symptoms recur, would cover for Enterococcus  Metastatic non-small cell lung cancer/adenocarcinoma -status post lobectomy, resection of brain met and chemoradiation.  Currently on oral chemotherapy, with Tagrisso.  Followed by oncologist, Dr. Earlie Server.  Recent MRI showed no new brain mets.  Chest x-ray  this encounter showed postsurgical changes that are stable and no other acute findings. -Continue home Tagrisso  History of DVT -on Xarelto.  Last dose 10/21 8 PM.  Xarelto held perioperatively and resumed.  No evidence to suggest acute DVT  Dementia -appears mild and stable, without behavioral disturbance -Continue home Aricept   DVT prophylaxis: On Xarelto Code Status:   Code Status: Full Code Family Communication: None at bedside Disposition Plan: Patient declined SNF, to be determined if home with home health and proper equipment is feasible.  Medically stable for discharge.   Consultants:   Orthopedics  Procedures:   ORIF of right distal femur fracture, 10/22  Antimicrobials:   Keflex, start 10/22, stop 10/26   Objective: Vitals:   06/11/19 2350 06/12/19 0510 06/12/19 0816 06/12/19 1326  BP:  139/71 (!) 134/57 138/62  Pulse:  97 89 85  Resp:  _0 Temp:  98.2 F (36.8 C) 99.5 F (37.5 C) 99 F (37.2 C)  TempSrc:  Oral Oral Oral  SpO2: 96% 92% 94% 95%  Weight:      Height:        Intake/Output Summary (Last 24 hours) at 06/12/2019 1654 Last data filed at 06/12/2019 0900 Gross per 24 hour  Intake 460 ml  Output 2550 ml  Net -2090 ml   Filed Weights   06/10/19 2311  Weight: 78.5 kg    Examination:  General exam: Sleeping, awoke easily, no acute distress, appears uncomfortable, frail and chronically ill-appearing HEENT: clear conjunctiva, dry mucus membranes, hearing grossly normal  Respiratory system: Decreased breath sounds but clear anteriorly, no wheezes, rales or rhonchi, normal respiratory effort. Cardiovascular system: normal S1/S2, RRR, no JVD, murmurs, rubs, gallops, heartbeat palpable at left sternal border Gastrointestinal system: soft, non-tender, non-distended abdomen, normal bowel sounds. Central nervous system: no gross focal neurologic deficits, normal speech Extremities: Right lower extremity in brace  Skin: dry, intact, normal  temperature    Data Reviewed: I have personally reviewed following labs and imaging studies  CBC: Recent Labs  Lab 06/10/19 2317 06/11/19 0016 06/11/19 0542 06/12/19 0259  WBC 7.2  --  8.3 7.5  NEUTROABS 5.9  --   --   --   HGB 11.3* 11.9* 10.4* 9.2*  HCT 37.1 35.0* 33.1* 30.2*  MCV 97.1  --  97.1 97.7  PLT 193  --  163 290   Basic Metabolic Panel: Recent Labs  Lab 06/11/19 0016 06/11/19 0542 06/12/19 0259  NA 141 139 137  K 3.6 4.2 3.8  CL 107 107 106  CO2  --  24 23  GLUCOSE 127* 137* 144*  BUN _1 CREATININE 1.00 0.96 0.71  CALCIUM  --  9.4 8.6*   GFR: Estimated Creatinine Clearance: 68.9 mL/min (by C-G formula based on SCr of 0.71 mg/dL). Liver Function Tests: No results for input(s): AST, ALT, ALKPHOS, BILITOT, PROT, ALBUMIN in the last 168 hours. No results for input(s): LIPASE, AMYLASE in the last 168 hours. No results for input(s): AMMONIA in the last 168 hours. Coagulation Profile: No results for input(s): INR,  PROTIME in the last 168 hours. Cardiac Enzymes: No results for input(s): CKTOTAL, CKMB, CKMBINDEX, TROPONINI in the last 168 hours. BNP (last 3 results) No results for input(s): PROBNP in the last 8760 hours. HbA1C: No results for input(s): HGBA1C in the last 72 hours. CBG: No results for input(s): GLUCAP in the last 168 hours. Lipid Profile: No results for input(s): CHOL, HDL, LDLCALC, TRIG, CHOLHDL, LDLDIRECT in the last 72 hours. Thyroid Function Tests: No results for input(s): TSH, T4TOTAL, FREET4, T3FREE, THYROIDAB in the last 72 hours. Anemia Panel: No results for input(s): VITAMINB12, FOLATE, FERRITIN, TIBC, IRON, RETICCTPCT in the last 72 hours. Sepsis Labs: No results for input(s): PROCALCITON, LATICACIDVEN in the last 168 hours.  Recent Results (from the past 240 hour(s))  SARS Coronavirus 2 by RT PCR (hospital order, performed in [facility] hospital lab) Nasopharyngeal Nasopharyngeal Swab     Status: None   Collection  Time: 06/11/19 12:31 AM   Specimen: Nasopharyngeal Swab  Result Value Ref Range Status   SARS Coronavirus 2 NEGATIVE NEGATIVE Final    Comment: (NOTE) If result is NEGATIVE SARS-CoV-2 target nucleic acids are NOT DETECTED. The SARS-CoV-2 RNA is generally detectable in upper and lower  respiratory specimens during the acute phase of infection. The lowest  concentration of SARS-CoV-2 viral copies this assay can detect is 250  copies / mL. A negative result does not preclude SARS-CoV-2 infection  and should not be used as the sole basis for treatment or other  patient management decisions.  A negative result may occur with  improper specimen collection / handling, submission of specimen other  than nasopharyngeal swab, presence of viral mutation(s) within the  areas targeted by this assay, and inadequate number of viral copies  (<250 copies / mL). A negative result must be combined with clinical  observations, patient history, and epidemiological information. If result is POSITIVE SARS-CoV-2 target nucleic acids are DETECTED. The SARS-CoV-2 RNA is generally detectable in upper and lower  respiratory specimens dur ing the acute phase of infection.  Positive  results are indicative of active infection with SARS-CoV-2.  Clinical  correlation with patient history and other diagnostic information is  necessary to determine patient infection status.  Positive results do  not rule out bacterial infection or co-infection with other viruses. If result is PRESUMPTIVE POSTIVE SARS-CoV-2 nucleic acids MAY BE PRESENT.   A presumptive positive result was obtained on the submitted specimen  and confirmed on repeat testing.  While 2019 novel coronavirus  (SARS-CoV-2) nucleic acids may be present in the submitted sample  additional confirmatory testing may be necessary for epidemiological  and / or clinical management purposes  to differentiate between  SARS-CoV-2 and other Sarbecovirus currently known  to infect humans.  If clinically indicated additional testing with an alternate test  methodology [phone]) is advised. The SARS-CoV-2 RNA is generally  detectable in upper and lower respiratory sp ecimens during the acute  phase of infection. The expected result is Negative. Fact Sheet for Patients:  StrictlyIdeas.no Fact Sheet for Healthcare Providers: BankingDealers.co.za This test is not yet approved or cleared by the [place] FDA and has been authorized for detection and/or diagnosis of SARS-CoV-2 by FDA under an Emergency Use Authorization (EUA).  This EUA will remain in effect (meaning this test can be used) for the duration of the COVID-19 declaration under Section 564(b)(1) of the Act, 21 U.S.C. section 360bbb-3(b)(1), unless the authorization is terminated or revoked sooner. Performed at [facility], [address] [place], [place] [address]          Radiology Studies: Dg Knee 1-2 Views Right  Result Date: 06/11/2019 CLINICAL DATA:  Distal femoral and proximal tibial fractures EXAM: RIGHT KNEE - 1-2 VIEW; DG C-ARM 1-60 MIN COMPARISON:  None. FLUOROSCOPY TIME:  Fluoroscopy Time:  1 minutes 31 seconds Radiation Exposure Index (if provided by the fluoroscopic device): Not available Number of Acquired Spot Images: 6 FINDINGS: Distal femoral fracture is again identified. Fixation sideplate is subsequently placed with multiple fixation screws along the lateral aspect. Fixation sideplate along the lateral aspect of the tibia is noted as well. Fracture fragments are in near anatomic alignment. IMPRESSION: ORIF of distal femoral and proximal tibial fractures. Electronically Signed   By: Inez Catalina M.D.   On: 06/11/2019 12:50   Dg Tibia/fibula Right  Result Date: 06/11/2019 CLINICAL DATA:  Pain after fall EXAM: RIGHT TIBIA AND FIBULA - 2 VIEW COMPARISON:  None. FINDINGS: Comminuted impacted intra-articular  fracture of the intracondylar notch and medial femoral condyle. There is also comminuted mildly impacted intra-articular fracture of the lateral tibial plateau. There is diffuse osteopenia. Prepatellar subcutaneous edema. IMPRESSION: Comminuted fractures of the distal femur and lateral tibial plateau. Prepatellar subcutaneous edema. Diffuse osteopenia. Electronically Signed   By: Prudencio Pair M.D.   On: 06/11/2019 00:18   Ct Head Wo Contrast  Result Date: 06/11/2019 CLINICAL DATA:  Fall, on blood thinner EXAM: CT HEAD WITHOUT CONTRAST TECHNIQUE: Contiguous axial images were obtained from the base of the skull through the vertex without intravenous contrast. COMPARISON:  September 13, 2018 FINDINGS: The study is limited due to patient motion. Brain: No evidence of acute territorial infarction, hemorrhage, hydrocephalus,extra-axial collection or mass lesion/mass effect. There is dilatation the ventricles and sulci consistent with age-related atrophy. Low-attenuation changes in the deep white matter consistent with small vessel ischemia. Vascular: No hyperdense vessel or unexpected calcification. Skull: Postsurgical changes from a prior left occipital craniotomy are again noted. Sinuses/Orbits: The visualized paranasal sinuses and mastoid air cells are clear. The orbits and globes intact. Other: None Cervical spine: Alignment: Physiologic Skull base and vertebrae: Visualized skull base is intact. No atlanto-occipital dissociation. The vertebral body heights are well maintained. No fracture or pathologic osseous lesion seen. Soft tissues and spinal canal: The visualized paraspinal soft tissues are unremarkable. No prevertebral soft tissue swelling is seen. The spinal canal is grossly unremarkable, no large epidural collection or significant canal narrowing. Disc levels: Mild disc height loss with uncovertebral osteophytes and disc osteophyte complex is seen most notable at C4-C5. Upper chest: The lung apices are  clear. Thoracic inlet is within normal limits. Other: None IMPRESSION: No acute intracranial abnormality, somewhat limited due to patient motion. Findings consistent with age related atrophy and chronic small vessel ischemia No acute fracture or malalignment of the spine. Electronically Signed   By: Prudencio Pair M.D.   On: 06/11/2019 00:38   Ct Cervical Spine Wo Contrast  Result Date: 06/11/2019 CLINICAL DATA:  Fall, on blood thinner EXAM: CT HEAD WITHOUT CONTRAST TECHNIQUE: Contiguous axial images were obtained from the base of the skull through the vertex without intravenous contrast. COMPARISON:  September 13, 2018 FINDINGS: The study is limited due to patient motion. Brain: No evidence of acute territorial infarction, hemorrhage, hydrocephalus,extra-axial collection or mass lesion/mass effect. There is dilatation the ventricles and sulci consistent with age-related atrophy. Low-attenuation changes in the deep white matter consistent with small vessel ischemia. Vascular: No hyperdense vessel or unexpected calcification. Skull: Postsurgical changes from a  prior left occipital craniotomy are again noted. Sinuses/Orbits: The visualized paranasal sinuses and mastoid air cells are clear. The orbits and globes intact. Other: None Cervical spine: Alignment: Physiologic Skull base and vertebrae: Visualized skull base is intact. No atlanto-occipital dissociation. The vertebral body heights are well maintained. No fracture or pathologic osseous lesion seen. Soft tissues and spinal canal: The visualized paraspinal soft tissues are unremarkable. No prevertebral soft tissue swelling is seen. The spinal canal is grossly unremarkable, no large epidural collection or significant canal narrowing. Disc levels: Mild disc height loss with uncovertebral osteophytes and disc osteophyte complex is seen most notable at C4-C5. Upper chest: The lung apices are clear. Thoracic inlet is within normal limits. Other: None IMPRESSION: No  acute intracranial abnormality, somewhat limited due to patient motion. Findings consistent with age related atrophy and chronic small vessel ischemia No acute fracture or malalignment of the spine. Electronically Signed   By: Prudencio Pair M.D.   On: 06/11/2019 00:38   Ct Knee Right Wo Contrast  Result Date: 06/11/2019 CLINICAL DATA:  Complex knee fractures. EXAM: CT OF THE right KNEE WITHOUT CONTRAST TECHNIQUE: Multidetector CT imaging of the right knee was performed according to the standard protocol. Multiplanar CT image reconstructions were also generated. COMPARISON:  Right knee radiographs 06/10/2019 FINDINGS: Oblique coursing mildly displaced fracture extending from the medial supracondylar region through the condylar notch. Maximum displacement along the articular surface is approximately 6 mm. Associated die punch type fracture involving the lateral tibial plateau with approximately 5 mm of depression and mild comminution. The fracture extends out through the lateral cortex of the tibia near the metadiaphyseal junction. Associated moderate to large lipohemarthrosis is noted. Fairly significant underlying osteoporosis. The patella and fibula are intact. Grossly by CT the cruciate and collateral ligaments appear intact. The quadriceps and patellar tendons are intact. IMPRESSION: 1. Oblique coursing intra-articular medial femoral condylar fracture as detailed above. 2. Mildly depressed die punch type fracture involving the lateral tibial plateau. Electronically Signed   By: Marijo Sanes M.D.   On: 06/11/2019 06:55   Dg Chest Port 1 View  Result Date: 06/11/2019 CLINICAL DATA:  Preop, lung cancer EXAM: PORTABLE CHEST 1 VIEW COMPARISON:  September 13, 2018 FINDINGS: The cardiomediastinal silhouette is unchanged from prior exam. Aortic knob calcifications. Again noted is left hilar surgical clips and postsurgical changes. Elevation of the left hemidiaphragm. The right lung is clear. IMPRESSION: Stable  postsurgical changes.  No acute cardiopulmonary process. Electronically Signed   By: Prudencio Pair M.D.   On: 06/11/2019 02:13   Dg Knee Complete 4 Views Right  Result Date: 06/11/2019 CLINICAL DATA:  Knee pain EXAM: RIGHT KNEE - COMPLETE 4+ VIEW COMPARISON:  None. FINDINGS: There is comminuted slightly impacted fracture seen through the intracondylar notch extending through the medial femoral condyle and medial metadiaphysis. There is also intra-articular mildly comminuted impacted fracture seen through the lateral tibial plateau. A small lipohemarthrosis is seen. Prepatellar subcutaneous edema is noted. IMPRESSION: 1. Comminuted impacted intra-articular fracture through the intracondylar notch extending through the medial femoral condyle and femoral shaft. 2. Comminuted mildly impacted intra-articular fracture of the lateral tibial plateau. 3. Small lipohemarthrosis Electronically Signed   By: Prudencio Pair M.D.   On: 06/11/2019 00:13   Dg Knee Right Port  Result Date: 06/11/2019 CLINICAL DATA:  Status post ORIF of distal femoral and proximal tibial fractures EXAM: PORTABLE RIGHT KNEE - 1-2 VIEW COMPARISON:  Intraop films from earlier in the same day. FINDINGS: Lateral fixation side plates are  noted along the distal femur and proximal tibia with multiple fixation screws. Fracture fragments are in near anatomic alignment. IMPRESSION: ORIF of distal femoral and proximal tibial fractures. Electronically Signed   By: Inez Catalina M.D.   On: 06/11/2019 14:49   Dg C-arm 1-60 Min  Result Date: 06/11/2019 CLINICAL DATA:  Distal femoral and proximal tibial fractures EXAM: RIGHT KNEE - 1-2 VIEW; DG C-ARM 1-60 MIN COMPARISON:  None. FLUOROSCOPY TIME:  Fluoroscopy Time:  1 minutes 31 seconds Radiation Exposure Index (if provided by the fluoroscopic device): Not available Number of Acquired Spot Images: 6 FINDINGS: Distal femoral fracture is again identified. Fixation sideplate is subsequently placed with multiple  fixation screws along the lateral aspect. Fixation sideplate along the lateral aspect of the tibia is noted as well. Fracture fragments are in near anatomic alignment. IMPRESSION: ORIF of distal femoral and proximal tibial fractures. Electronically Signed   By: Inez Catalina M.D.   On: 06/11/2019 12:50   Dg Hip Unilat W Or Wo Pelvis 2-3 Views Right  Result Date: 06/11/2019 CLINICAL DATA:  Hip pain after fall EXAM: DG HIP (WITH OR WITHOUT PELVIS) 2-3V RIGHT COMPARISON:  None. FINDINGS: There is no evidence of hip fracture or dislocation. 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 IMPRESSION: No acute osseous abnormality. Electronically Signed   By: Prudencio Pair M.D.   On: 06/11/2019 00:12   Dg Femur Port, [place] 2 Views Right  Result Date: 06/11/2019 CLINICAL DATA:  Status post ORIF of right femoral fracture EXAM: RIGHT FEMUR PORTABLE 2 VIEW COMPARISON:  None. FINDINGS: Lateral fixation sideplate is noted with multiple fixation screws. Fracture fragments are in near anatomic alignment. No other focal abnormality is noted. IMPRESSION: Status post ORIF of right femoral fracture. Electronically Signed   By: Inez Catalina M.D.   On: 06/11/2019 14:50        Scheduled Meds:  acetaminophen  500 mg Oral Q6H   cephALEXin  500 mg Oral TID   docusate sodium  100 mg Oral BID   donepezil  10 mg Oral QPM   feeding supplement (ENSURE ENLIVE)  237 mL Oral BID BM   multivitamin with minerals  1 tablet Oral Daily   osimertinib mesylate  80 mg Oral Daily   rivaroxaban  20 mg Oral QPM   Continuous Infusions:  lactated ringers 10 mL/hr at 06/11/19 1028   methocarbamol (ROBAXIN) IV       LOS: 1 day    Time spent: 30 to 35 minutes    Ezekiel Slocumb, DO [facility] Pager: [phone]  If 7PM-7AM, please contact night-coverage [email or web address] Password [facility] 06/12/2019, 4:54 PM

## 2019-06-12 NOTE — Evaluation (Signed)
Physical Therapy Evaluation Patient Details Name: Valerie [name] MRN: [id number] DOB: 06-13-1946 Today's Date: 06/12/2019   History of Present Illness  Pt is a 73 y/o female admitted after falling at home in which she sustained a R distal femur fx and R tibial plateau fx. Pt is now s/p ORIF R LE and NWB R LE. PMh including but not limited to dementia, metastatic NSCL cancer and hx of resection of a brain tumor.    Clinical Impression  Pt presented supine in bed with HOB elevated, awake and willing to participate in therapy session. Prior to admission, pt reported that she ambulates with use of a rollator PRN and was independent with ADLs. Pt lives with her husband in a two level home with a few steps to enter. At the time of evaluation, pt greatly limited secondary to pain and weakness. She required max-total A x2 for bed mobility and transfers. Pt would continue to benefit from skilled physical therapy services at this time while admitted and after d/c to address the below listed limitations in order to improve overall safety and independence with functional mobility.     Follow Up Recommendations SNF    Equipment Recommendations  None recommended by PT    Recommendations for Other Services       Precautions / Restrictions Precautions Precautions: Fall Restrictions Weight Bearing Restrictions: Yes RLE Weight Bearing: Non weight bearing      Mobility  Bed Mobility Overal bed mobility: Needs Assistance Bed Mobility: Supine to Sit;Sit to Supine     Supine to sit: Max assist;+2 for physical assistance;HOB elevated Sit to supine: Total assist;+2 for physical assistance   General bed mobility comments: increased time and effort, pt able to use R UE on bed rail and initiate movement of L LE; heavy physical assistance of two needed with use of bed pads to position pt's hips at EOB  Transfers Overall transfer level: Needs assistance Equipment used: Rolling walker (2  wheeled);2 person hand held assist Transfers: Sit to/from Stand Sit to Stand: Max assist;Total assist;+2 physical assistance;From elevated surface         General transfer comment: initially attempted to stand from EOB with RW and needed total A x2 with minimal clearance of buttocks from bed; second attempt [facility] was utilized with max A x2 and with a bit more clearance of buttocks from bed; however, unable to achieve full upright standing position  Ambulation/Gait                Stairs            Wheelchair Mobility    Modified Rankin (Stroke Patients Only)       Balance Overall balance assessment: Needs assistance;History of Falls Sitting-balance support: Feet supported;Bilateral upper extremity supported Sitting balance-Leahy Scale: Poor Sitting balance - Comments: min-max A needed to maintain upright sitting position at EOB Postural control: Posterior lean Standing balance support: Bilateral upper extremity supported Standing balance-Leahy Scale: Zero Standing balance comment: total A                             Pertinent Vitals/Pain Pain Assessment: 0-10 Pain Score: 8  Pain Location: R LE Pain Descriptors / Indicators: Guarding;Grimacing Pain Intervention(s): Monitored during session;Repositioned    Home Living Family/patient expects to be discharged to:: Private residence Living Arrangements: Spouse/significant other Available Help at Discharge: Family Type of Home: House Home Access: Stairs to enter   [employer] of Steps:  3 Home Layout: Two level;Able to live on main level with bedroom/bathroom Home Equipment: Walker - 4 wheels;Grab bars - tub/shower      Prior Function Level of Independence: Independent with assistive device(s)         Comments: ambulated with use of rollator when outside of her home; no AD needed to ambulate in the home     Hand Dominance   Dominant Hand: Right    Extremity/Trunk Assessment    Upper Extremity Assessment Upper Extremity Assessment: Defer to OT evaluation;Generalized weakness    Lower Extremity Assessment Lower Extremity Assessment: RLE deficits/detail RLE Deficits / Details: pt with decreased strength and ROM limitations secondary to post-op pain and weakness. RLE: Unable to fully assess due to pain    Cervical / Trunk Assessment Cervical / Trunk Assessment: Kyphotic  Communication   Communication: No difficulties  Cognition Arousal/Alertness: Awake/alert Behavior During Therapy: Flat affect Overall Cognitive Status: Impaired/Different from baseline Area of Impairment: Memory;Following commands;Safety/judgement;Problem solving                     Memory: Decreased recall of precautions Following Commands: Follows one step commands with increased time;Follows one step commands inconsistently Safety/Judgement: Decreased awareness of deficits;Decreased awareness of safety   Problem Solving: Slow processing;Decreased initiation;Difficulty sequencing;Requires verbal cues;Requires tactile cues        General Comments      Exercises     Assessment/Plan    PT Assessment Patient needs continued PT services  PT Problem List Decreased strength;Decreased range of motion;Decreased activity tolerance;Decreased balance;Decreased coordination;Decreased mobility;Decreased cognition;Decreased knowledge of use of DME;Decreased safety awareness;Decreased knowledge of precautions;Pain       PT Treatment Interventions DME instruction;Gait training;Functional mobility training;Therapeutic activities;Therapeutic exercise;Balance training;Neuromuscular re-education;Cognitive remediation;Patient/family education    PT Goals (Current goals can be found in the Care Plan section)  Acute Rehab PT Goals Patient Stated Goal: decrease pain PT Goal Formulation: With patient Time For Goal Achievement: 06/26/19 Potential to Achieve Goals: Fair    Frequency Min  2X/week   Barriers to discharge        Co-evaluation PT/OT/SLP Co-Evaluation/Treatment: Yes Reason for Co-Treatment: For patient/therapist safety;To address functional/ADL transfers PT goals addressed during session: Mobility/safety with mobility;Balance;Proper use of DME;Strengthening/ROM         AM-PAC PT "6 Clicks" Mobility  Outcome Measure Help needed turning from your back to your side while in a flat bed without using bedrails?: Total Help needed moving from lying on your back to sitting on the side of a flat bed without using bedrails?: Total Help needed moving to and from a bed to a chair (including a wheelchair)?: Total Help needed standing up from a chair using your arms (e.g., wheelchair or bedside chair)?: Total Help needed to walk in hospital room?: Total Help needed climbing 3-5 steps with a railing? : Total 6 Click Score: 6    End of Session Equipment Utilized During Treatment: Gait belt Activity Tolerance: Patient limited by pain Patient left: in bed;with call bell/phone within reach;Other (comment)(nurse tech present to reapply purewick) Nurse Communication: Mobility status;Need for lift equipment PT Visit Diagnosis: Other abnormalities of gait and mobility (R26.89);Pain Pain - Right/Left: Right Pain - part of body: Leg    Time: 6301-6010 PT Time Calculation (min) (ACUTE ONLY): 25 min   Charges:   PT Evaluation $PT Eval Moderate Complexity: 1 Mod          Sherie Don, PT, DPT  Acute Rehabilitation Services Pager [phone] Office [phone]  [place] 06/12/2019, 11:44 AM

## 2019-06-12 NOTE — Anesthesia Postprocedure Evaluation (Signed)
Anesthesia Post Note  Patient: Valerie [name]  Procedure(s) Performed: OPEN REDUCTION INTERNAL FIXATION (ORIF) DISTAL FEMUR FRACTURE (Right Leg Upper) Open Reduction Internal Fixation (Orif) Tibial Plateau (Right )     Patient location during evaluation: PACU Anesthesia Type: General Level of consciousness: awake and alert Pain management: pain level controlled Vital Signs Assessment: post-procedure vital signs reviewed and stable Respiratory status: spontaneous breathing, nonlabored ventilation, respiratory function stable and patient connected to nasal cannula oxygen Cardiovascular status: blood pressure returned to baseline and stable Postop Assessment: no apparent nausea or vomiting Anesthetic complications: no    Last Vitals:  Vitals:   06/11/19 2350 06/12/19 0510  BP:  139/71  Pulse:  97  Resp:  18  Temp:  36.8 C  SpO2: 96% 92%    Last Pain:  Vitals:   06/12/19 0510  TempSrc: Oral  PainSc:                  Valerie [name],Valerie [name]

## 2019-06-12 NOTE — Progress Notes (Signed)
Patient ID: Valerie [name], female   DOB: [birth year], 73 y.o.   MRN: [id number]   LOS: 1 day   Subjective: Fairly sore this morning, not feeling great.   Objective: Vital signs in last 24 hours: Temp:  [97.2 F (36.2 C)-99.5 F (37.5 C)] 99.5 F (37.5 C) (10/23 0816) Pulse Rate:  [74-98] 89 (10/23 0816) Resp:  [12-27] 16 (10/23 0816) BP: (114-151)/(54-78) 134/57 (10/23 0816) SpO2:  [77 %-100 %] 94 % (10/23 0816) Last BM Date: 06/11/19   Laboratory  CBC Recent Labs    06/11/19 0542 06/12/19 0259  WBC 8.3 7.5  HGB 10.4* 9.2*  HCT 33.1* 30.2*  PLT 163 166   BMET Recent Labs    06/11/19 0542 06/12/19 0259  NA 139 137  K 4.2 3.8  CL 107 106  CO2 24 23  GLUCOSE 137* 144*  BUN 19 10  CREATININE 0.96 0.71  CALCIUM 9.4 8.6*     Physical Exam General appearance: alert and no distress  RLE: DP 2+, EHL intact, KI in place   Assessment/Plan: POD#1 s/p ORIF distal femur -- KI to stay on at all times. NWB. F/u with Dr. Percell Miller in office in 10-14d.    Lisette Abu, PA-C Orthopedic Surgery [phone] 06/12/2019

## 2019-06-12 NOTE — TOC Initial Note (Addendum)
Transition of Care [facility]) - Initial/Assessment Note    Patient Details  Name: Valerie [name] MRN: [id number] Date of Birth: 22-Aug-1945  [facility] CM/SW Contact:    Midge Minium RN, BSN, NCM-BC, ACM-RN [phone] Phone Number: 06/12/2019, 4:25 PM  Clinical Narrative:                 CM following for dispositional needs. CM spoke to the patient via phone to discuss the POC; patient is a 73 y/o female admitted after falling at home in which she sustained a R distal femur fx and R tibial plateau fx. Pt is now s/p ORIF R LE and NWB R LE. PT/OT eval completed with SNF recommended; discussed the recommendations with the patient, with the patient opting to transition home and not to a SNF d/t COVID. CM discussed the need for 24/7 assistance, with the patient stating her spouse is retired and will be available. CM discussed the request with Sherie Don, PT; Patient is currently Max assistance and will need a hoyer lift, hospital bed, wheelchair and may be too much to handle for the patients spouse. Awaiting OT recommendations for home. CMS HH compare and DME list provided with no preference. CM team will continue to follow to coordinate DCP needs.   Expected Discharge Plan: [place] Barriers to Discharge: Continued Medical Work up   Patient Goals and CMS Choice Patient states their goals for this hospitalization and ongoing recovery are:: "to return home" CMS Medicare.gov Compare Post Acute Care list provided to:: Patient Choice offered to / list presented to : Patient  Expected Discharge Plan and Services Expected Discharge Plan: [place] In-house Referral: NA Discharge Planning Services: CM Consult Post Acute Care Choice: Durable Medical Equipment, Home Health Living arrangements for the past 2 months: Single Family Home                  Prior Living Arrangements/Services Living arrangements for the past 2 months: Single Family  Home Lives with:: Self, Spouse Patient language and need for interpreter reviewed:: Yes Do you feel safe going back to the place where you live?: Yes      Need for Family Participation in Patient Care: Yes (Comment) Care giver support system in place?: Yes (comment) Current home services: DME Criminal Activity/Legal Involvement Pertinent to Current Situation/Hospitalization: No - Comment as needed  Activities of Daily Living Home Assistive Devices/Equipment: Wheelchair, Environmental consultant (specify type), Eyeglasses, Dentures (specify type)(upper partial plate. 4 wheeled walker with seat, handicapped toilet seats) ADL Screening (condition at time of admission) Patient's cognitive ability adequate to safely complete daily activities?: Yes Is the patient deaf or have difficulty hearing?: No Does the patient have difficulty seeing, even when wearing glasses/contacts?: No Does the patient have difficulty concentrating, remembering, or making decisions?: Yes Patient able to express need for assistance with ADLs?: Yes Does the patient have difficulty dressing or bathing?: Yes Independently performs ADLs?: No Communication: Independent Dressing (OT): Needs assistance Is this a change from baseline?: Pre-admission baseline Grooming: Needs assistance Is this a change from baseline?: Pre-admission baseline Feeding: Needs assistance Is this a change from baseline?: Pre-admission baseline Bathing: Needs assistance Is this a change from baseline?: Pre-admission baseline Toileting: Dependent Is this a change from baseline?: Change from baseline, expected to last >3days In/Out Bed: Dependent Is this a change from baseline?: Change from baseline, expected to last >3 days Walks in Home: Dependent Is this a change from baseline?: Change from baseline,  expected to last >3 days Does the patient have difficulty walking or climbing stairs?: Yes Weakness of Legs: Right Weakness of Arms/Hands: None  Permission  Sought/Granted Permission sought to share information with : Case Manager, Chartered certified accountant granted to share information with : Yes, Verbal Permission Granted     Permission granted to share info w AGENCY: HH/DME agencies        Emotional Assessment       Orientation: : Oriented to Self, Oriented to Place, Oriented to  Time, Oriented to Situation Alcohol / Substance Use: Not Applicable Psych Involvement: No (comment)  Admission diagnosis:  Preoperative testing [Z01.818] Fall, initial encounter [W19.XXXA] Closed nondisplaced fracture of condyle of right femur, initial encounter ([place]) [S72.414A] Closed fracture of right tibial plateau, initial encounter [S82.141A] Closed fracture of distal end of right femur, initial encounter [facility]) [S72.401A] Patient Active Problem List   Diagnosis Date Noted  . Closed fracture of distal end of right femur, initial encounter ([place]) 06/11/2019  . Closed fracture of lateral portion of right tibial plateau 06/11/2019  . History of DVT of lower extremity 06/11/2019  . Blurry vision 01/06/2019  . Headache 07/24/2016  . Chronic diarrhea 08/24/2015  . Sepsis ([place])   . Encounter for antineoplastic chemotherapy 01/31/2015  . Urinary retention 10/20/2014  . Ileus ([place]) 10/20/2014  . Cellulitis of leg, right 10/20/2014  . Fracture of right proximal fibula   . Ankle fracture 10/02/2014  . Ankle fracture 10/02/2014  . Lung cancer ([place]) 10/02/2014  . Closed fibular fracture 10/02/2014  . Dementia ([place]) 10/02/2014  . UTI (lower urinary tract infection) 10/02/2014  . Reactive airway disease 10/02/2014  . Fall 10/02/2014  . Closed left ankle fracture 10/01/2014  . Right fibular fracture 10/01/2014  . Lung metastasis ([place])   . LESION, ANUS 04/26/2009  . DIARRHEA 04/26/2009  . Primary cancer of right upper lobe of lung ([place]) 04/22/2009  . Malignant neoplasm of brain ([place]) 04/22/2009  . VITAMIN D DEFICIENCY 04/22/2009  .  Dementia ([place]) 04/22/2009  . ANAL FISTULA 04/22/2009  . OSTEOPOROSIS 04/22/2009   PCP:  [name], MD Pharmacy:   [place], [place] - [address] [place] [address] Phone: [phone] Fax: [phone]  [place], [place] - [address] [place] [address] Phone: [phone] Fax: [phone]     Social Determinants of Health (SDOH) Interventions    Readmission Risk Interventions No flowsheet data found.

## 2019-06-12 NOTE — Progress Notes (Signed)
Initial Nutrition Assessment  DOCUMENTATION CODES:   Not applicable  INTERVENTION:   -Downgrade diet to dysphagia 3 (advanced mechanical soft) for ease of intake -MVI with minerals daily -Ensure Enlive po BID, each supplement provides 350 kcal and 20 grams of protein -Magic cup TID with meals, each supplement provides 290 kcal and 9 grams of protein  NUTRITION DIAGNOSIS:   Increased nutrient needs related to post-op healing as evidenced by estimated needs.  GOAL:   Patient will meet greater than or equal to 90% of their needs  MONITOR:   PO intake, Supplement acceptance, Labs, Weight trends, Skin, I & O's  REASON FOR ASSESSMENT:   Consult Assessment of nutrition requirement/status  ASSESSMENT:   Valerie [name] was getting out of the shower when she slipped and fell. She had immediate right knee pain and could not get up. She activated her LifeAlert and was brought to the ED. X-rays showed a distal femur fx and tibia plateau fx and orthopedic surgery was consulted. She lives at home with her husband and usually ambulates with a RW.  Pt admitted with rt distal femur fx and rt tibia plateau fx.  10/23- s/p PROCEDURE:  OPEN REDUCTION INTERNAL FIXATION (ORIF) DISTAL FEMUR FRACTURE, Open Reduction Internal Fixation (Orif) Tibial Plateau  Reviewed I/O's: -1.7 L x 24 hours   UOP: 2.6 L x 24 hours  Per orthopedics notes, plan non-operative management for rt tibial plateau fx.   Pt very drowsy at time of visit. She reports feeling well today, but did not wake up long enough to have conversation with this RD. No family present to provide further nutrition-related history at this time.   Per meal completion records, PO 50%. Pt with increased nutritional needs due to post-operative healing and would benefit from addition of nutritional supplements. Noted pt is on a soft diet with no pertinent GI history; suspect that pt would benefit more form a mechanical soft diet. Soft diet is a GI  soft diet (low fiber, low residue) that is designed for pts suffering from GI disease/exacerbations and GI surgeries.   Reviewed wt hx; wt has been stable over the past year.   Medications reviewed and include lactated ringers infusion @ 10 ml/hr @ 10 ml/hr.   Labs reviewed.   NUTRITION - FOCUSED PHYSICAL EXAM:    Most Recent Value  Orbital Region  Mild depletion  Upper Arm Region  No depletion  Thoracic and Lumbar Region  No depletion  Buccal Region  No depletion  Temple Region  Moderate depletion  Clavicle Bone Region  No depletion  Clavicle and Acromion Bone Region  No depletion  Scapular Bone Region  No depletion  Dorsal Hand  No depletion  Patellar Region  No depletion  Anterior Thigh Region  No depletion  Posterior Calf Region  No depletion  Edema (RD Assessment)  Mild  Hair  Reviewed  Eyes  Reviewed  Mouth  Reviewed  Skin  Reviewed  Nails  Reviewed       Diet Order:   Diet Order            DIET SOFT Room service appropriate? Yes; Fluid consistency: Thin  Diet effective now              EDUCATION NEEDS:   No education needs have been identified at this time  Skin:  Skin Assessment: Skin Integrity Issues: Skin Integrity Issues:: Incisions Incisions: rt leg, bilateral neck  Last BM:  06/12/19  Height:   Ht Readings from Last 1  Encounters:  06/10/19 5\' 8"  (1.727 m)    Weight:   Wt Readings from Last 1 Encounters:  06/10/19 78.5 kg    Ideal Body Weight:  63.6 kg  BMI:  Body mass index is 26.3 kg/m.  Estimated Nutritional Needs:   Kcal:  1700-1900  Protein:  90-105 grams  Fluid:  > 1.7 L    Jenifer A. Jimmye Norman, RD, LDN, [place] Registered Dietitian II Certified Diabetes Care and Education Specialist Pager: [phone] After hours Pager: [phone]

## 2019-06-13 ENCOUNTER — Other Ambulatory Visit: Payer: Self-pay

## 2019-06-13 LAB — CBC WITH DIFFERENTIAL/PLATELET
Abs Immature Granulocytes: 0.02 10*3/uL (ref 0.00–0.07)
Basophils Absolute: 0 10*3/uL (ref 0.0–0.1)
Basophils Relative: 0 %
Eosinophils Absolute: 0 10*3/uL (ref 0.0–0.5)
Eosinophils Relative: 0 %
HCT: 28.6 % — ABNORMAL LOW (ref 36.0–46.0)
Hemoglobin: 9.1 g/dL — ABNORMAL LOW (ref 12.0–15.0)
Immature Granulocytes: 0 %
Lymphocytes Relative: 6 %
Lymphs Abs: 0.4 10*3/uL — ABNORMAL LOW (ref 0.7–4.0)
MCH: 29.5 pg (ref 26.0–34.0)
MCHC: 31.8 g/dL (ref 30.0–36.0)
MCV: 92.9 fL (ref 80.0–100.0)
Monocytes Absolute: 0.7 10*3/uL (ref 0.1–1.0)
Monocytes Relative: 10 %
Neutro Abs: 6.1 10*3/uL (ref 1.7–7.7)
Neutrophils Relative %: 84 %
Platelets: 165 10*3/uL (ref 150–400)
RBC: 3.08 MIL/uL — ABNORMAL LOW (ref 3.87–5.11)
RDW: 13.6 % (ref 11.5–15.5)
WBC: 7.3 10*3/uL (ref 4.0–10.5)
nRBC: 0 % (ref 0.0–0.2)

## 2019-06-13 LAB — BASIC METABOLIC PANEL
Anion gap: 10 (ref 5–15)
BUN: 12 mg/dL (ref 8–23)
CO2: 24 mmol/L (ref 22–32)
Calcium: 8.8 mg/dL — ABNORMAL LOW (ref 8.9–10.3)
Chloride: 103 mmol/L (ref 98–111)
Creatinine, Ser: 0.81 mg/dL (ref 0.44–1.00)
GFR calc Af Amer: >60 mL/min (ref >60)
GFR calc non Af Amer: >60 mL/min (ref >60)
Glucose, Bld: 137 mg/dL — ABNORMAL HIGH (ref 70–99)
Potassium: 3.6 mmol/L (ref 3.5–5.1)
Sodium: 137 mmol/L (ref 135–145)

## 2019-06-13 LAB — MAGNESIUM: Magnesium: 2.1 mg/dL (ref 1.7–2.4)

## 2019-06-13 NOTE — Plan of Care (Signed)
  Problem: Nutrition: Goal: Adequate nutrition will be maintained Outcome: Progressing   

## 2019-06-13 NOTE — Progress Notes (Signed)
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 coli UTI.  She initially presented to [facility] ED due to acute onset of right knee pain after she slipped at home while getting out of the shower and fell without hitting her head. She was admitted for right distal femur fracture and right tibial plateau fracture. Orthopedics was consulted and advised transferring to [facility] for surgery. Last dose of Xarelto per patient was on 10/21at approximately 8 PM.  Xarelto held on admission.  Patient underwent surgical repair of the right distal femur fracture afternoon of 10/22.  Tibial plateau fracture nonoperative.  Postoperatively has had significant pain.  Patient is to remain nonweightbearing with brace on at all times.  At baseline patient ambulates with a rolling walker as needed and is independent in ADLs.  PT/OT evaluated patient today (10/23) and recommend SNF on discharge as patient is max assist x2 for bed mobility and transfers.  Social work spoke with patient she declined SNF and wishes to be discharged home due to [place] [facility].  Will have needs for [facility] lift, hospital bed, wheelchair.  Patient advised may require more assistance than husband can handle.  Case management working on home health services.  Follow-up with Dr. Percell Miller in clinic in 10 to 14 days.  Of note, in the ED reported she had been having urinary urgency and frequency recently, was found to have UTI secondary to E. coli, and is completing a course of Keflex.  On chart review most recent UA and culture from 10/5, showing pansensitive E. coli as well as Enterococcus.  Patient currently denies symptoms, but  will cover for Enterococcus if symptoms recur.  Subjective '@TODAY'$ @: Patient seen this morning upright but asleep in bed.  Awoke easily and reported 8 out of 10 pain in her right leg.  Denies any other acute complaints including fevers, chills, abdominal pain, chest pain or shortness of breath.  Assessment & Plan:   Principal Problem:   Closed fracture of distal end of right femur, initial encounter [facility]) Active Problems:   Closed fracture of lateral portion of right tibial plateau   Primary cancer of right upper lobe of lung ([place])   Lower urinary tract infectious disease   History of DVT of lower extremity   Acute blood loss anemia   Dementia (HCC)  Right distal femur fracture  Right tibial plateau fracture due to Mechanical fall - status post ORIF distal femur fracture on 10/22 by Dr. Percell Miller.  CTs of head and C-spine without acute abnormalities.  Evaluated by PT 10/23, noted to be max assist x2 with all bed mobility and transfers.  SNF recommended on discharge, however patient declines and wants to return home with home health services.   -Pain management:    Tylenol as needed for mild pain   Norco as needed for moderate pain   Morphine 10 mg IV as needed for severe pain -Bowel regimen -Discharge planning ongoing  Acute blood loss anemia -hemoglobin 13.3 last month, 11.3 on presentation, and 9.2 this morning postop.  No sign of active ongoing bleeding at this time. -Follow-up CBC in the morning -We will transfuse if hemoglobin less than 7  E. coli/Enterococcus faecalis UTI -improved, patient asymptomatic at this time.  Continue Keflex for E. coli coverage.  If symptoms recur, would cover for Enterococcus  Metastatic non-small cell lung cancer/adenocarcinoma -status post lobectomy, resection of brain met and chemoradiation.  Currently on oral chemotherapy, with Tagrisso.  Followed by oncologist, Dr. Earlie Server.  Recent MRI showed no new brain mets.  Chest x-ray this encounter showed  postsurgical changes that are stable and no other acute findings. -Continue home Tagrisso  History of DVT -on Xarelto.  Last dose 10/21 8 PM.  Xarelto held perioperatively and resumed.  No evidence to suggest acute DVT  Dementia -appears mild and stable, without behavioral disturbance -Continue home Aricept   DVT prophylaxis: On Xarelto Code Status:   Full Code Family Communication: None at bedside Disposition Plan: Patient declined SNF, to be determined if home with home health and proper equipment is feasible.  Medically stable for discharge.   Consultants:   Orthopedics  Procedures:   ORIF of right distal femur fracture, 10/22  Antimicrobials:   Keflex, start 10/22, stop 10/26   Objective: Vitals:   06/13/19 0345 06/13/19 0400 06/13/19 0836 06/13/19 1256  BP: 135/69  137/68 133/68  Pulse: (!) 111 86 96 96  Resp:   18 18  Temp: 98.7 F (37.1 C)  99 F (37.2 C) 99.2 F (37.3 C)  TempSrc: Oral  Oral Oral  SpO2: 96%  95% 100%  Weight:      Height:        Intake/Output Summary (Last 24 hours) at 06/13/2019 1626 Last data filed at 06/13/2019 1359 Gross per 24 hour  Intake 720 ml  Output 700 ml  Net 20 ml   Filed Weights   06/10/19 2311  Weight: 78.5 kg    Examination:  General exam:  Appears uncomfortable, frail and chronically ill-appearing, no apparent acute distress Respiratory system: Decreased breath sounds but clear anteriorly, normal respiratory effort. Cardiovascular system: normal S1/S2, RRR, bounding palpable heartbeat at left sternal border Gastrointestinal system: soft, non-tender, non-distended abdomen Central nervous system: no gross focal neurologic deficits, normal speech Extremities: Right lower extremity in brace, distal sensation intact  Skin: dry, intact, normal temperature   Data Reviewed: I have personally reviewed following labs and imaging studies  CBC: Recent Labs  Lab 06/10/19 2317 06/11/19 0016 06/11/19 0542  06/12/19 0259 06/13/19 0632  WBC 7.2  --  8.3 7.5 7.3  NEUTROABS 5.9  --   --   --  6.1  HGB 11.3* 11.9* 10.4* 9.2* 9.1*  HCT 37.1 35.0* 33.1* 30.2* 28.6*  MCV 97.1  --  97.1 97.7 92.9  PLT 193  --  163 166 858   Basic Metabolic Panel: Recent Labs  Lab 06/11/19 0016 06/11/19 0542 06/12/19 0259 06/13/19 0632  NA 141 139 137 137  K 3.6 4.2 3.8 3.6  CL 107 107 106 103  CO2  --  '24 23 24  '$ GLUCOSE 127* 137* 144* 137*  BUN '20 19 10 12  '$ CREATININE 1.00 0.96 0.71 0.81  CALCIUM  --  9.4 8.6* 8.8*  MG  --   --   --  2.1   GFR: Estimated Creatinine Clearance: 68.1 mL/min (by C-G formula based on SCr of 0.81 mg/dL). Liver Function Tests: No results for input(s): AST, ALT, ALKPHOS, BILITOT, PROT, ALBUMIN in the last 168 hours. No results for input(s): LIPASE, AMYLASE in the last 168 hours. No results for input(s): AMMONIA in the last 168 hours. Coagulation Profile: No results for input(s):  INR, PROTIME in the last 168 hours. Cardiac Enzymes: No results for input(s): CKTOTAL, CKMB, CKMBINDEX, TROPONINI in the last 168 hours. BNP (last 3 results) No results for input(s): PROBNP in the last 8760 hours. HbA1C: No results for input(s): HGBA1C in the last 72 hours. CBG: No results for input(s): GLUCAP in the last 168 hours. Lipid Profile: No results for input(s): CHOL, HDL, LDLCALC, TRIG, CHOLHDL, LDLDIRECT in the last 72 hours. Thyroid Function Tests: No results for input(s): TSH, T4TOTAL, FREET4, T3FREE, THYROIDAB in the last 72 hours. Anemia Panel: No results for input(s): VITAMINB12, FOLATE, FERRITIN, TIBC, IRON, RETICCTPCT in the last 72 hours. Sepsis Labs: No results for input(s): PROCALCITON, LATICACIDVEN in the last 168 hours.  Recent Results (from the past 240 hour(s))  SARS Coronavirus 2 by RT PCR (hospital order, performed in [facility] hospital lab) Nasopharyngeal Nasopharyngeal Swab     Status: None   Collection Time: 06/11/19 12:31 AM   Specimen: Nasopharyngeal Swab   Result Value Ref Range Status   SARS Coronavirus 2 NEGATIVE NEGATIVE Final    Comment: (NOTE) If result is NEGATIVE SARS-CoV-2 target nucleic acids are NOT DETECTED. The SARS-CoV-2 RNA is generally detectable in upper and lower  respiratory specimens during the acute phase of infection. The lowest  concentration of SARS-CoV-2 viral copies this assay can detect is 250  copies / mL. A negative result does not preclude SARS-CoV-2 infection  and should not be used as the sole basis for treatment or other  patient management decisions.  A negative result may occur with  improper specimen collection / handling, submission of specimen other  than nasopharyngeal swab, presence of viral mutation(s) within the  areas targeted by this assay, and inadequate number of viral copies  (<250 copies / mL). A negative result must be combined with clinical  observations, patient history, and epidemiological information. If result is POSITIVE SARS-CoV-2 target nucleic acids are DETECTED. The SARS-CoV-2 RNA is generally detectable in upper and lower  respiratory specimens dur ing the acute phase of infection.  Positive  results are indicative of active infection with SARS-CoV-2.  Clinical  correlation with patient history and other diagnostic information is  necessary to determine patient infection status.  Positive results do  not rule out bacterial infection or co-infection with other viruses. If result is PRESUMPTIVE POSTIVE SARS-CoV-2 nucleic acids MAY BE PRESENT.   A presumptive positive result was obtained on the submitted specimen  and confirmed on repeat testing.  While 2019 novel coronavirus  (SARS-CoV-2) nucleic acids may be present in the submitted sample  additional confirmatory testing may be necessary for epidemiological  and / or clinical management purposes  to differentiate between  SARS-CoV-2 and other Sarbecovirus currently known to infect humans.  If clinically indicated additional  testing with an alternate test  methodology [phone]) is advised. The SARS-CoV-2 RNA is generally  detectable in upper and lower respiratory sp ecimens during the acute  phase of infection. The expected result is Negative. Fact Sheet for Patients:  StrictlyIdeas.no Fact Sheet for Healthcare Providers: BankingDealers.co.za This test is not yet approved or cleared by the [place] FDA and has been authorized for detection and/or diagnosis of SARS-CoV-2 by FDA under an Emergency Use Authorization (EUA).  This EUA will remain in effect (meaning this test can be used) for the duration of the COVID-19 declaration under Section 564(b)(1) of the Act, 21 U.S.C. section 360bbb-3(b)(1), unless the authorization is terminated or revoked sooner. Performed at [facility] [place]  [address] [place], [place] [address]          Radiology Studies: No results found.      Scheduled Meds:  cephALEXin  500 mg Oral TID   docusate sodium  100 mg Oral BID   donepezil  10 mg Oral QPM   feeding supplement (ENSURE ENLIVE)  237 mL Oral BID BM   multivitamin with minerals  1 tablet Oral Daily   osimertinib mesylate  80 mg Oral Daily   rivaroxaban  20 mg Oral QPM   Continuous Infusions:  lactated ringers 10 mL/hr at 06/11/19 1028   methocarbamol (ROBAXIN) IV       LOS: 2 days    Time spent: 25-30 min    Ezekiel Slocumb, DO [facility] Pager: [phone]  If 7PM-7AM, please contact night-coverage [email or web address] Password [email or web address] 06/13/2019, 4:26 PM

## 2019-06-13 NOTE — Progress Notes (Signed)
[facility] Progress Note weekend coverage  Patient ID: Valerie [name] MRN: [id number] DOB/AGE: Mar 05, 1946 73 y.o.  Subjective:  Doing okay Minimal appetite Slightly nauseous Pain in right leg is okay Did not work with therapy today but did work with therapy yesterday after surgery  Patient does not want a nursing facility.  Her functional level postoperatively is still a little unclear at this time.  She was a +2 assist yesterday with therapy   ROS + nausea   Objective:   VITALS:   Vitals:   06/13/19 0345 06/13/19 0400 06/13/19 0836 06/13/19 1256  BP: 135/69  137/68 133/68  Pulse: (!) 111 86 96 96  Resp:   18 18  Temp: 98.7 F (37.1 C)  99 F (37.2 C) 99.2 F (37.3 C)  TempSrc: Oral  Oral Oral  SpO2: 96%  95% 100%  Weight:      Height:        Estimated body mass index is 26.3 kg/m as calculated from the following:   Height as of this encounter: 5\' 8"  (1.727 m).   Weight as of this encounter: 78.5 kg.   Intake/Output      10/23 0701 - 10/24 0700 10/24 0701 - 10/25 0700   P.O. 480 480   I.V. (mL/kg)     Total Intake(mL/kg) 480 (6.1) 480 (6.1)   Urine (mL/kg/hr) 200 (0.1) 500 (0.6)   Emesis/NG output     Stool 0 0   Blood     Total Output 200 500   Net +280 -20        Urine Occurrence 4 x 2 x   Stool Occurrence 3 x 2 x     LABS  Results for orders placed or performed during the hospital encounter of 06/10/19 (from the past 24 hour(s))  CBC with Differential/Platelet     Status: Abnormal   Collection Time: 06/13/19  6:32 AM  Result Value Ref Range   WBC 7.3 4.0 - 10.5 K/uL   RBC 3.08 (L) 3.87 - 5.11 MIL/uL   Hemoglobin 9.1 (L) 12.0 - 15.0 g/dL   HCT 28.6 (L) 36.0 - 46.0 %   MCV 92.9 80.0 - 100.0 fL   MCH 29.5 26.0 - 34.0 pg   MCHC 31.8 30.0 - 36.0 g/dL   RDW 13.6 11.5 - 15.5 %   Platelets 165 150 - 400 K/uL   nRBC 0.0 0.0 - 0.2 %   Neutrophils  Relative % 84 %   Neutro Abs 6.1 1.7 - 7.7 K/uL   Lymphocytes Relative 6 %   Lymphs Abs 0.4 (L) 0.7 - 4.0 K/uL   Monocytes Relative 10 %   Monocytes Absolute 0.7 0.1 - 1.0 K/uL   Eosinophils Relative 0 %   Eosinophils Absolute 0.0 0.0 - 0.5 K/uL   Basophils Relative 0 %   Basophils Absolute 0.0 0.0 - 0.1 K/uL   Immature Granulocytes 0 %   Abs Immature Granulocytes 0.02 0.00 - 0.07 K/uL  Basic metabolic panel     Status: Abnormal   Collection Time: 06/13/19  6:32 AM  Result Value Ref Range   Sodium 137 135 - 145 mmol/L   Potassium 3.6 3.5 - 5.1 mmol/L   Chloride 103 98 - 111 mmol/L   CO2 24 22 - 32 mmol/L   Glucose,  Bld 137 (H) 70 - 99 mg/dL   BUN 12 8 - 23 mg/dL   Creatinine, Ser 0.81 0.44 - 1.00 mg/dL   Calcium 8.8 (L) 8.9 - 10.3 mg/dL   GFR calc non Af Amer >60 >60 mL/min   GFR calc Af Amer >60 >60 mL/min   Anion gap 10 5 - 15  Magnesium     Status: None   Collection Time: 06/13/19  6:32 AM  Result Value Ref Range   Magnesium 2.1 1.7 - 2.4 mg/dL     PHYSICAL EXAM:   Gen: sitting up in bed, NAD  Ext:       Right lower extremity  Knee immobilizer is in place  Dressing is clean, dry, intact  Swelling is minimal distally  Extremity is warm  No pain with passive stretching  Compartments are soft and nontender  EHL, FHL, lesser toe motor functions are intact.  Ankle extension, flexion, inversion and eversion are intact.  No additional acute findings noted to the right leg  Assessment/Plan: 2 Days Post-Op   Principal Problem:   Closed fracture of distal end of right femur, initial encounter [facility]) Active Problems:   Primary cancer of right upper lobe of lung ([place])   Dementia ([place])   Lower urinary tract infectious disease   Closed fracture of lateral portion of right tibial plateau   History of DVT of lower extremity   Acute blood loss anemia   Anti-infectives (From admission, onward)   Start     Dose/Rate Route Frequency Ordered Stop   06/11/19 1030  ceFAZolin  (ANCEF) IVPB 2g/100 mL premix     2 g 200 mL/hr over 30 Minutes Intravenous On call to O.R. 06/11/19 1018 06/11/19 1117   06/11/19 1027  ceFAZolin (ANCEF) 2-4 GM/100ML-% IVPB    Note to Pharmacy: Lisette Grinder   : cabinet override      06/11/19 1027 06/11/19 1047   06/11/19 1000  cephALEXin (KEFLEX) capsule 500 mg     500 mg Oral 3 times daily 06/11/19 0353      .  POD/HD#: 43  73 year old female with complex medical history s/p fall with right distal femur fracture and right tibial plateau fracture  - Fall  -Right distal femur fracture and right tibial plateau fracture status post ORIF  TDWB right lower extremity  Unrestricted range of motion of knee and hip  Will order hinged knee brace  PT/OT  Not sure patient will be safe enough to discharge to home but will see how she does with therapy over the next several days  Ice prn    - Pain management:  Appears the patient is not use any pain medicine today  Will schedule some Tylenol  Norco for breakthrough pain   - ABL anemia/Hemodynamics  Currently stable  Continue to monitor  - Medical issues   Per primary team  - DVT/PE prophylaxis:  Xarelto  - ID:   Perioperative antibiotics  - FEN/GI prophylaxis/Foley/Lines:  Dys 3 diet   - Dispo:  Continue with inpatient care  Continue work with therapies  Follow-up with Dr. Percell Miller 7 to 10 days after discharge from hospital   Jari Pigg, PA-C [phone] (C) 06/13/2019, 4:51 PM  [facility] [place] [address] [phone] Domingo Sep (F)

## 2019-06-13 NOTE — Plan of Care (Signed)

## 2019-06-14 LAB — CBC WITH DIFFERENTIAL/PLATELET
Abs Immature Granulocytes: 0.02 10*3/uL (ref 0.00–0.07)
Basophils Absolute: 0 10*3/uL (ref 0.0–0.1)
Basophils Relative: 0 %
Eosinophils Absolute: 0 10*3/uL (ref 0.0–0.5)
Eosinophils Relative: 1 %
HCT: 27.1 % — ABNORMAL LOW (ref 36.0–46.0)
Hemoglobin: 8.8 g/dL — ABNORMAL LOW (ref 12.0–15.0)
Immature Granulocytes: 0 %
Lymphocytes Relative: 11 %
Lymphs Abs: 0.7 10*3/uL (ref 0.7–4.0)
MCH: 30.1 pg (ref 26.0–34.0)
MCHC: 32.5 g/dL (ref 30.0–36.0)
MCV: 92.8 fL (ref 80.0–100.0)
Monocytes Absolute: 0.7 10*3/uL (ref 0.1–1.0)
Monocytes Relative: 11 %
Neutro Abs: 4.6 10*3/uL (ref 1.7–7.7)
Neutrophils Relative %: 77 %
Platelets: 177 10*3/uL (ref 150–400)
RBC: 2.92 MIL/uL — ABNORMAL LOW (ref 3.87–5.11)
RDW: 13.5 % (ref 11.5–15.5)
WBC: 6 10*3/uL (ref 4.0–10.5)
nRBC: 0 % (ref 0.0–0.2)

## 2019-06-14 LAB — BASIC METABOLIC PANEL
Anion gap: 8 (ref 5–15)
BUN: 16 mg/dL (ref 8–23)
CO2: 26 mmol/L (ref 22–32)
Calcium: 8.6 mg/dL — ABNORMAL LOW (ref 8.9–10.3)
Chloride: 103 mmol/L (ref 98–111)
Creatinine, Ser: 0.76 mg/dL (ref 0.44–1.00)
GFR calc Af Amer: >60 mL/min (ref >60)
GFR calc non Af Amer: >60 mL/min (ref >60)
Glucose, Bld: 109 mg/dL — ABNORMAL HIGH (ref 70–99)
Potassium: 3.6 mmol/L (ref 3.5–5.1)
Sodium: 137 mmol/L (ref 135–145)

## 2019-06-14 MED ORDER — ACETAMINOPHEN 325 MG PO TABS
650.0000 mg | ORAL_TABLET | Freq: Three times a day (TID) | ORAL | Status: DC
  Administered 2019-06-14 – 2019-06-18 (×10): 650 mg via ORAL
  Filled 2019-06-14 (×11): qty 2

## 2019-06-14 MED ORDER — POTASSIUM CHLORIDE CRYS ER 20 MEQ PO TBCR
40.0000 meq | EXTENDED_RELEASE_TABLET | Freq: Once | ORAL | Status: AC
  Administered 2019-06-14: 40 meq via ORAL
  Filled 2019-06-14: qty 2

## 2019-06-14 NOTE — Plan of Care (Signed)

## 2019-06-14 NOTE — Progress Notes (Signed)
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 coli UTI.She initially presented [facility] ED due to acute onset of right knee pain after she slipped at home while getting out of the shower and fell without hitting her head. She was admitted for right distal femur fracture and right tibial plateau fracture. Orthopedics was consulted and advised transferring to [facility] for surgery. Last dose of Xarelto per patient was on 10/21at approximately 8 PM.Xarelto held on admission. Patient underwent surgical repair of the right distal femur fracture afternoon of 10/22. Tibial plateau fracture nonoperative. Postoperatively has had significant pain. Patient is to remain nonweightbearing with brace on at all times. At baseline patient ambulates with a rolling walker as needed and is independent in ADLs. PT/OT evaluated patient today (10/23) and recommend SNF on discharge as patient is max assist x2 for bed mobility and transfers. Social work spoke with patient she declined SNF and wishes to be discharged home due to [place]. Will have needs for [facility] lift, hospital bed, wheelchair. Patient advised may require more assistance than husband can handle. Case management working on home health services. Follow-up with Dr. Percell Miller in clinic in 10 to 14 days.  Of note, in the ED reported she had been havingurinary urgency and frequency recently, was found to have UTI secondary to E. coli, and is completing a course of Keflex. On chart review most recent UA and culture from 10/5, showing pansensitive E. coli as well as Enterococcus. Patient currently denies symptoms, but  will cover for Enterococcus if symptoms recur.  Subjective _0 @: Patient seen and examined, husband at bedside.  Patient sitting up in bed no acute distress.  Reports pain better today.  Slept well.  Still has no appetite.  Denies fevers or chills, chest pain, shortness of breath, nausea, vomiting, diarrhea.  Husband, retired ED physician, requested to see postop x-rays which we reviewed together.  No acute events reported overnight.  Assessment & Plan:   Principal Problem:   Closed fracture of distal end of right femur, initial encounter [facility]) Active Problems:   Closed fracture of lateral portion of right tibial plateau   Primary cancer of right upper lobe of lung ([place])   Lower urinary tract infectious disease   History of DVT of lower extremity   Acute blood loss anemia   Dementia (HCC)   Right distal femur fracture Right tibial plateau fracturedue to Mechanical fall-status post ORIF on 10/22 by Dr. Percell Miller. CTs of head and C-spine without acute abnormalities. Evaluated by PT 10/23, noted to be max assist x2 with all bed mobility and transfers. SNF recommended on discharge, however patient declines and wants to return home with home health services.  -Pain management:  Tylenol as needed for mild pain Norco as needed for moderate pain Morphine 10 mg IV as needed for severe pain -Bowel regimen -Discharge planning ongoing  Acute blood loss anemia-hemoglobin 13.3 last month, 11.3 on presentation, and 9.2 this morning postop. No sign of active ongoing bleeding at this time. -Follow-up CBC in the morning -We will transfuse if hemoglobin less than 7  E. coli/Enterococcus faecalis UTI-improved, patient asymptomatic at this time. Continue Keflex for E. coli coverage. 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 Currently on oral chemotherapy,  with Tagrisso. Followed by oncologist, Dr. Earlie Server.Recent MRI showed no new brain mets. Chest x-ray this encounter showed postsurgical changes that are stable and no other acute findings. -Continue home Tagrisso  History of DVT-on Xarelto. Last dose 10/21 8 PM. Xarelto held perioperatively and resumed. No evidence to suggest acute DVT  Dementia-appears mild and stable, without behavioral disturbance -Continue home Aricept   DVT prophylaxis:On Xarelto Code Status: Full Code Family Communication:None at bedside Disposition Plan:Patient declined SNF, to be determined if home with home health and proper equipment is feasible. Medically stable for discharge.   Consultants:  Orthopedics  Procedures:  ORIF of right distal femur fracture, 10/22  Antimicrobials:  Keflex, start 10/22, stop 10/26   Objective: Vitals:   06/13/19 2149 06/14/19 0300 06/14/19 0315 06/14/19 0755  BP:  132/68  112/62  Pulse:  (!) 104 94 97  Resp: _0 Temp:  98.5 F (36.9 C)  99.4 F (37.4 C)  TempSrc:  Oral  Oral  SpO2: 92% 93%  92%  Weight:      Height:        Intake/Output Summary (Last 24 hours) at 06/14/2019 1651 Last data filed at 06/14/2019 1500 Gross per 24 hour  Intake 720 ml  Output -  Net 720 ml   Filed Weights   06/10/19 2311  Weight: 78.5 kg    Examination:  General exam: awake, alert, no acute distress, chronically ill-appearing HEENT: moist mucus membranes, hearing grossly normal  Respiratory system: clear to auscultation anteriorly, no wheezes, rales or rhonchi, normal respiratory effort. Cardiovascular system: normal S1/S2, RRR, no JVD, murmurs, rubs, gallops, no pedal edema.   Gastrointestinal system: soft, non-tender, non-distended abdomen, normal bowel sounds. Central nervous system: alert and oriented x4. no gross focal neurologic deficits, normal speech Extremities: Right lower extremity in brace and Ace wrap, distal sensation  intact. Skin: dry, intact, normal temperature Psychiatry: normal mood, congruent affect, judgement and insight appear normal    Data Reviewed: I have personally reviewed following labs and imaging studies  CBC: Recent Labs  Lab 06/10/19 2317 06/11/19 0016 06/11/19 0542 06/12/19 0259 06/13/19 0632 06/14/19 0553  WBC 7.2  --  8.3 7.5 7.3 6.0  NEUTROABS 5.9  --   --   --  6.1 4.6  HGB 11.3* 11.9* 10.4* 9.2* 9.1* 8.8*  HCT 37.1 35.0* 33.1* 30.2* 28.6* 27.1*  MCV 97.1  --  97.1 97.7 92.9 92.8  PLT 193  --  163 166 165 818   Basic Metabolic Panel: Recent Labs  Lab 06/11/19 0016 06/11/19 0542 06/12/19 0259 06/13/19 0632 06/14/19 0553  NA 141 139 137 137 137  K 3.6 4.2 3.8 3.6 3.6  CL 107 107 106 103 103  CO2  --  _1 GLUCOSE 127* 137* 144* 137* 109*  BUN _2 CREATININE 1.00 0.96 0.71 0.81 0.76  CALCIUM  --  9.4 8.6* 8.8* 8.6*  MG  --   --   --  2.1  --    GFR: Estimated Creatinine Clearance: 68.9 mL/min (by C-G formula based on SCr of 0.76 mg/dL). Liver Function Tests: No results for input(s): AST, ALT, ALKPHOS, BILITOT, PROT, ALBUMIN in the last 168 hours. No results for input(s): LIPASE, AMYLASE in the last 168 hours. No results for input(s): AMMONIA in the last  168 hours. Coagulation Profile: No results for input(s): INR, PROTIME in the last 168 hours. Cardiac Enzymes: No results for input(s): CKTOTAL, CKMB, CKMBINDEX, TROPONINI in the last 168 hours. BNP (last 3 results) No results for input(s): PROBNP in the last 8760 hours. HbA1C: No results for input(s): HGBA1C in the last 72 hours. CBG: No results for input(s): GLUCAP in the last 168 hours. Lipid Profile: No results for input(s): CHOL, HDL, LDLCALC, TRIG, CHOLHDL, LDLDIRECT in the last 72 hours. Thyroid Function Tests: No results for input(s): TSH, T4TOTAL, FREET4, T3FREE, THYROIDAB in the last 72 hours. Anemia Panel: No results for input(s): VITAMINB12, FOLATE, FERRITIN, TIBC,  IRON, RETICCTPCT in the last 72 hours. Sepsis Labs: No results for input(s): PROCALCITON, LATICACIDVEN in the last 168 hours.  Recent Results (from the past 240 hour(s))  SARS Coronavirus 2 by RT PCR (hospital order, performed in [facility] hospital lab) Nasopharyngeal Nasopharyngeal Swab     Status: None   Collection Time: 06/11/19 12:31 AM   Specimen: Nasopharyngeal Swab  Result Value Ref Range Status   SARS Coronavirus 2 NEGATIVE NEGATIVE Final    Comment: (NOTE) If result is NEGATIVE SARS-CoV-2 target nucleic acids are NOT DETECTED. The SARS-CoV-2 RNA is generally detectable in upper and lower  respiratory specimens during the acute phase of infection. The lowest  concentration of SARS-CoV-2 viral copies this assay can detect is 250  copies / mL. A negative result does not preclude SARS-CoV-2 infection  and should not be used as the sole basis for treatment or other  patient management decisions.  A negative result may occur with  improper specimen collection / handling, submission of specimen other  than nasopharyngeal swab, presence of viral mutation(s) within the  areas targeted by this assay, and inadequate number of viral copies  (<250 copies / mL). A negative result must be combined with clinical  observations, patient history, and epidemiological information. If result is POSITIVE SARS-CoV-2 target nucleic acids are DETECTED. The SARS-CoV-2 RNA is generally detectable in upper and lower  respiratory specimens dur ing the acute phase of infection.  Positive  results are indicative of active infection with SARS-CoV-2.  Clinical  correlation with patient history and other diagnostic information is  necessary to determine patient infection status.  Positive results do  not rule out bacterial infection or co-infection with other viruses. If result is PRESUMPTIVE POSTIVE SARS-CoV-2 nucleic acids MAY BE PRESENT.   A presumptive positive result was obtained on the submitted  specimen  and confirmed on repeat testing.  While 2019 novel coronavirus  (SARS-CoV-2) nucleic acids may be present in the submitted sample  additional confirmatory testing may be necessary for epidemiological  and / or clinical management purposes  to differentiate between  SARS-CoV-2 and other Sarbecovirus currently known to infect humans.  If clinically indicated additional testing with an alternate test  methodology [phone]) is advised. The SARS-CoV-2 RNA is generally  detectable in upper and lower respiratory sp ecimens during the acute  phase of infection. The expected result is Negative. Fact Sheet for Patients:  StrictlyIdeas.no Fact Sheet for Healthcare Providers: BankingDealers.co.za This test is not yet approved or cleared by the [place] FDA and has been authorized for detection and/or diagnosis of SARS-CoV-2 by FDA under an Emergency Use Authorization (EUA).  This EUA will remain in effect (meaning this test can be used) for the duration of the COVID-19 declaration under Section 564(b)(1) of the Act, 21 U.S.C. section 360bbb-3(b)(1), unless the authorization is terminated or revoked sooner.  Performed at [facility], [place] [address], [place], [place] [address]          Radiology Studies: No results found.      Scheduled Meds: . acetaminophen  650 mg Oral Q8H  . cephALEXin  500 mg Oral TID  . docusate sodium  100 mg Oral BID  . donepezil  10 mg Oral QPM  . feeding supplement (ENSURE ENLIVE)  237 mL Oral BID BM  . multivitamin with minerals  1 tablet Oral Daily  . osimertinib mesylate  80 mg Oral Daily  . rivaroxaban  20 mg Oral QPM   Continuous Infusions: . lactated ringers 10 mL/hr at 06/11/19 1028  . methocarbamol (ROBAXIN) IV       LOS: 3 days    Time spent: 25-30 min    Ezekiel Slocumb, DO [facility] Pager: [phone]  If 7PM-7AM, please contact  night-coverage [email or web address] Password [place] 06/14/2019, 4:51 PM

## 2019-06-14 NOTE — Progress Notes (Signed)
[facility] Progress Note Weekend coverage   Patient ID: Valerie [name] MRN: [id number] DOB/AGE: [birth year] 73 y.o.  Subjective:  Pain is better Less nausea Tolerating diet today  No therapies yesterday, not sure why  ROS As above Objective:   VITALS:   Vitals:   06/13/19 2149 06/14/19 0300 06/14/19 0315 06/14/19 0755  BP:  132/68  112/62  Pulse:  (!) 104 94 97  Resp: 16 16  18   Temp:  98.5 F (36.9 C)  99.4 F (37.4 C)  TempSrc:  Oral  Oral  SpO2: 92% 93%  92%  Weight:      Height:        Estimated body mass index is 26.3 kg/m as calculated from the following:   Height as of this encounter: 5\' 8"  (1.727 m).   Weight as of this encounter: 78.5 kg.   Intake/Output      10/24 0701 - 10/25 0700 10/25 0701 - 10/26 0700   P.O. 480 360   Total Intake(mL/kg) 480 (6.1) 360 (4.6)   Urine (mL/kg/hr) 500 (0.3)    Stool 0    Total Output 500    Net -20 +360        Urine Occurrence 2 x    Stool Occurrence 2 x      LABS  Results for orders placed or performed during the hospital encounter of 06/10/19 (from the past 24 hour(s))  CBC with Differential/Platelet     Status: Abnormal   Collection Time: 06/14/19  5:53 AM  Result Value Ref Range   WBC 6.0 4.0 - 10.5 K/uL   RBC 2.92 (L) 3.87 - 5.11 MIL/uL   Hemoglobin 8.8 (L) 12.0 - 15.0 g/dL   HCT 27.1 (L) 36.0 - 46.0 %   MCV 92.8 80.0 - 100.0 fL   MCH 30.1 26.0 - 34.0 pg   MCHC 32.5 30.0 - 36.0 g/dL   RDW 13.5 11.5 - 15.5 %   Platelets 177 150 - 400 K/uL   nRBC 0.0 0.0 - 0.2 %   Neutrophils Relative % 77 %   Neutro Abs 4.6 1.7 - 7.7 K/uL   Lymphocytes Relative 11 %   Lymphs Abs 0.7 0.7 - 4.0 K/uL   Monocytes Relative 11 %   Monocytes Absolute 0.7 0.1 - 1.0 K/uL   Eosinophils Relative 1 %   Eosinophils Absolute 0.0 0.0 - 0.5 K/uL   Basophils Relative 0 %   Basophils Absolute 0.0 0.0 - 0.1 K/uL   Immature  Granulocytes 0 %   Abs Immature Granulocytes 0.02 0.00 - 0.07 K/uL  Basic metabolic panel     Status: Abnormal   Collection Time: 06/14/19  5:53 AM  Result Value Ref Range   Sodium 137 135 - 145 mmol/L   Potassium 3.6 3.5 - 5.1 mmol/L   Chloride 103 98 - 111 mmol/L   CO2 26 22 - 32 mmol/L   Glucose, Bld 109 (H) 70 - 99 mg/dL   BUN 16 8 - 23 mg/dL   Creatinine, Ser 0.76 0.44 - 1.00 mg/dL   Calcium 8.6 (L) 8.9 - 10.3 mg/dL   GFR calc non Af Amer >60 >60 mL/min   GFR calc Af Amer >60 >60 mL/min   Anion gap 8 5 - 15     PHYSICAL EXAM:  Gen: sitting up in bed, NAD  Ext:       Right lower extremity             Knee immobilizer is in place             Dressing is clean, dry, intact             Swelling is minimal distally             Extremity is warm             No pain with passive stretching             Compartments are soft and nontender             EHL, FHL, lesser toe motor functions are intact.  Ankle extension, flexion, inversion and eversion are intact.             No additional acute findings noted to the right leg   Assessment/Plan: 3 Days Post-Op   Principal Problem:   Closed fracture of distal end of right femur, initial encounter [facility]) Active Problems:   Primary cancer of right upper lobe of lung ([place])   Dementia ([place])   Lower urinary tract infectious disease   Closed fracture of lateral portion of right tibial plateau   History of DVT of lower extremity   Acute blood loss anemia   Anti-infectives (From admission, onward)   Start     Dose/Rate Route Frequency Ordered Stop   06/11/19 1030  ceFAZolin (ANCEF) IVPB 2g/100 mL premix     2 g 200 mL/hr over 30 Minutes Intravenous On call to O.R. 06/11/19 1018 06/11/19 1117   06/11/19 1027  ceFAZolin (ANCEF) 2-4 GM/100ML-% IVPB    Note to Pharmacy: Lisette Grinder   : cabinet override      06/11/19 1027 06/11/19 1047   06/11/19 1000  cephALEXin (KEFLEX) capsule 500 mg     500 mg Oral 3 times daily 06/11/19  0353      .  POD/HD#: 74  73 year old female with complex medical history s/p fall with right distal femur fracture and right tibial plateau fracture   - Fall   -Right distal femur fracture and right tibial plateau fracture status post ORIF             TDWB right lower extremity             Unrestricted range of motion of knee and hip             Will order hinged knee brace             PT/OT             Not sure patient will be safe enough to discharge to home but will see how she does with therapy over the next several days             Ice prn      - Pain management:            Schedule Tylenol  Norco for breakthrough pain              - ABL anemia/Hemodynamics             Currently stable             Continue to monitor   - Medical issues              Per primary team   - DVT/PE  prophylaxis:             Xarelto   - ID:              Perioperative antibiotics completed   - FEN/GI prophylaxis/Foley/Lines:             Dys 3 diet    - Dispo:             Continue with inpatient care             Continue work with therapies  Discharge venue still unclear             Follow-up with Dr. Percell Miller 7 to 10 days after discharge from hospital     Jari Pigg, PA-C [phone] (C) 06/14/2019, 11:17 AM  [facility] [place]  [phone] Domingo Sep (F)

## 2019-06-14 NOTE — Plan of Care (Signed)
  Problem: Activity: Goal: Risk for activity intolerance will decrease Outcome: Progressing   Problem: Nutrition: Goal: Adequate nutrition will be maintained Outcome: Progressing   Problem: Pain Managment: Goal: General experience of comfort will improve Outcome: Progressing   Problem: Safety: Goal: Ability to remain free from injury will improve Outcome: Progressing   Problem: Skin Integrity: Goal: Risk for impaired skin integrity will decrease Outcome: Progressing   

## 2019-06-15 ENCOUNTER — Encounter (HOSPITAL_COMMUNITY): Payer: Self-pay | Admitting: Orthopedic Surgery

## 2019-06-15 LAB — MAGNESIUM: Magnesium: 2.4 mg/dL (ref 1.7–2.4)

## 2019-06-15 LAB — CBC
HCT: 27.1 % — ABNORMAL LOW (ref 36.0–46.0)
HCT: 27.8 % — ABNORMAL LOW (ref 36.0–46.0)
Hemoglobin: 8.7 g/dL — ABNORMAL LOW (ref 12.0–15.0)
Hemoglobin: 8.8 g/dL — ABNORMAL LOW (ref 12.0–15.0)
MCH: 29.5 pg (ref 26.0–34.0)
MCH: 29.5 pg (ref 26.0–34.0)
MCHC: 32.1 g/dL (ref 30.0–36.0)
MCHC: 31.7 g/dL (ref 30.0–36.0)
MCV: 91.9 fL (ref 80.0–100.0)
MCV: 93.3 fL (ref 80.0–100.0)
Platelets: 219 10*3/uL (ref 150–400)
Platelets: 224 10*3/uL (ref 150–400)
RBC: 2.95 MIL/uL — ABNORMAL LOW (ref 3.87–5.11)
RBC: 2.98 MIL/uL — ABNORMAL LOW (ref 3.87–5.11)
RDW: 13.6 % (ref 11.5–15.5)
RDW: 13.8 % (ref 11.5–15.5)
WBC: 6.2 10*3/uL (ref 4.0–10.5)
WBC: 6.3 10*3/uL (ref 4.0–10.5)
nRBC: 0 % (ref 0.0–0.2)
nRBC: 0 % (ref 0.0–0.2)

## 2019-06-15 LAB — BASIC METABOLIC PANEL
Anion gap: 10 (ref 5–15)
BUN: 23 mg/dL (ref 8–23)
CO2: 25 mmol/L (ref 22–32)
Calcium: 8.9 mg/dL (ref 8.9–10.3)
Chloride: 103 mmol/L (ref 98–111)
Creatinine, Ser: 0.76 mg/dL (ref 0.44–1.00)
GFR calc Af Amer: >60 mL/min (ref >60)
GFR calc non Af Amer: >60 mL/min (ref >60)
Glucose, Bld: 113 mg/dL — ABNORMAL HIGH (ref 70–99)
Potassium: 4.2 mmol/L (ref 3.5–5.1)
Sodium: 138 mmol/L (ref 135–145)

## 2019-06-15 NOTE — Progress Notes (Signed)
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 coli UTI.She initially presented [facility] ED due to acute onset of right knee pain after she slipped at home while getting out of the shower and fell without hitting her head. She was admitted for right distal femur fracture and right tibial plateau fracture. Orthopedics was consulted and advised transferring to [facility] for surgery.  Xarelto was held perioperatively. Patient underwent surgical repair of the right distal femur fracture afternoon of 10/22. Postop, patient is to remain nonweightbearing with brace on at all times. At baseline patient ambulates with a rolling walker as needed and is independent in ADLs. PT/OT evaluated patient 10/23 and recommend SNF on discharge as patient is max assist x2 for bed mobility and transfers. Patient and her family prefer not for patient to go due to Covid and restricted visitation. Case management working on home health services. Follow-up with Dr. Percell Miller in clinic in 10 to 14 days.  Of note, in the ED reported she had been havingurinary urgency and frequency recently, was found to have UTI secondary to E. coli, and is completing a course of Keflex. On chart review most recent UA and culture from 10/5, showing pansensitive E. coli as well as Enterococcus. Patient currently denies symptoms, but will cover for Enterococcus if symptoms recur.  Subjective 10/26: Patient seen and examined, laying awake in bed.  Reports increased pain today currently 8 of 10.  Reports poor and interrupted sleep due to frequent bowel movements.  States she does not want any more stool  softener and laxatives.  Reports ongoing poor appetite, but drinking okay.  Denies fevers or chills.  Reports intermittent nausea but no vomiting.  Assessment & Plan:   Principal Problem:   Closed fracture of distal end of right femur, initial encounter [facility]) Active Problems:   Closed fracture of lateral portion of right tibial plateau   Primary cancer of right upper lobe of lung ([place])   Lower urinary tract infectious disease   History of DVT of lower extremity   Acute blood loss anemia   Dementia (HCC)   Right distal femur fracture Right tibial plateau fracturedue to Mechanical fall-status post ORIF on 10/22 by Dr. Percell Miller. CTs of head and C-spine without acute abnormalities. Evaluated by PT 10/23, noted to be max assist x2 with all bed mobility and transfers. SNF recommended on discharge, however patient declines and wants to return home with home health services.  -Pain management:  Tylenol as needed for mild pain Norco as needed for moderate pain Morphine 10 mg IV as needed for severe pain -Bowel regimen -Discharge planning ongoing  Acute blood loss anemia-hemoglobin 13.3 last month, 11.3 on presentation, and 8.7 this morning. No sign of active ongoing bleeding at this time. -Follow-up CBC in the morning -transfuse if hemoglobin less than 7  E. coli/Enterococcus faecalis UTI-improved, patient asymptomatic at this time. Continue Keflex for E. coli coverage through 10/26. 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 Currently on oral chemotherapy, with Tagrisso. Followed by oncologist, Dr. Earlie Server.Recent MRI showed no new brain mets. Chest x-ray this encounter showed  postsurgical changes that are stable and no other acute findings. -Continue home Tagrisso  History of DVT-on Xarelto. Last dose 10/21 8 PM. Xarelto held  perioperatively and resumed. No evidence to suggest acute DVT.   Dementia-appears mild and stable, without behavioral disturbance -Continue home Aricept   DVT prophylaxis:On Xarelto Code Status:Full Code Family Communication:None at bedside Disposition Plan:Patient declined SNF, to be determined if home with home health and proper equipment is feasible. Medically stable for discharge.   Consultants:  Orthopedics  Procedures:  ORIF of right distal femur fracture, 10/22  Antimicrobials:  Keflex, start 10/22, stop 10/26    Objective: Vitals:   06/14/19 0755 06/14/19 1736 06/14/19 1900 06/15/19 0302  BP: 112/62 110/61 (!) 106/44 116/63  Pulse: 97 95 96 82  Resp: _0 Temp: 99.4 F (37.4 C) 98.7 F (37.1 C) 98.9 F (37.2 C) 98.4 F (36.9 C)  TempSrc: Oral Oral Oral Oral  SpO2: 92% 93% 93% 93%  Weight:      Height:        Intake/Output Summary (Last 24 hours) at 06/15/2019 0725 Last data filed at 06/14/2019 1500 Gross per 24 hour  Intake 720 ml  Output --  Net 720 ml   Filed Weights   06/10/19 2311  Weight: 78.5 kg    Examination:  General exam: awake, alert, no acute distress, frail, chronically ill-appearing  Respiratory system: clear to auscultation anteriorly (patient unable to sit forward for posterior auscultation), no wheezes, rales or rhonchi, normal respiratory effort. Cardiovascular system: normal S1/S2, RRR, no JVD, murmurs, rubs, gallops.   Gastrointestinal system: soft, non-tender, non-distended abdomen, normal bowel sounds. Central nervous system: alert and oriented x4. no gross focal neurologic deficits, normal speech Extremities: moves all, normal tone Skin: dry, intact, normal temperature Psychiatry: normal mood, congruent affect, judgement and insight appear normal    Data Reviewed: I have personally reviewed following labs and imaging studies  CBC: Recent Labs  Lab 06/10/19 2317  06/11/19 0542  06/12/19 0259 06/13/19 1761 06/14/19 0553 06/15/19 0445  WBC 7.2  --  8.3 7.5 7.3 6.0 6.2  NEUTROABS 5.9  --   --   --  6.1 4.6  --   HGB 11.3*   < > 10.4* 9.2* 9.1* 8.8* 8.7*  HCT 37.1   < > 33.1* 30.2* 28.6* 27.1* 27.1*  MCV 97.1  --  97.1 97.7 92.9 92.8 91.9  PLT 193  --  163 166 165 177 219   < > = values in this interval not displayed.   Basic Metabolic Panel: Recent Labs  Lab 06/11/19 0542 06/12/19 0259 06/13/19 0632 06/14/19 0553 06/15/19 0445  NA 139 137 137 137 138  K 4.2 3.8 3.6 3.6 4.2  CL 107 106 103 103 103  CO2 _1 GLUCOSE 137* 144* 137* 109* 113*  BUN _2 CREATININE 0.96 0.71 0.81 0.76 0.76  CALCIUM 9.4 8.6* 8.8* 8.6* 8.9  MG  --   --  2.1  --  2.4   GFR: Estimated Creatinine Clearance: 68.9 mL/min (by C-G formula based on SCr of 0.76 mg/dL). Liver Function Tests: No results for input(s): AST, ALT, ALKPHOS, BILITOT, PROT, ALBUMIN in the last 168 hours. No results for input(s): LIPASE, AMYLASE in the last 168 hours. No results for input(s): AMMONIA in the last 168 hours. Coagulation Profile: No results for input(s): INR, PROTIME in the last 168 hours. Cardiac Enzymes: No results for input(s): CKTOTAL, CKMB,  CKMBINDEX, TROPONINI in the last 168 hours. BNP (last 3 results) No results for input(s): PROBNP in the last 8760 hours. HbA1C: No results for input(s): HGBA1C in the last 72 hours. CBG: No results for input(s): GLUCAP in the last 168 hours. Lipid Profile: No results for input(s): CHOL, HDL, LDLCALC, TRIG, CHOLHDL, LDLDIRECT in the last 72 hours. Thyroid Function Tests: No results for input(s): TSH, T4TOTAL, FREET4, T3FREE, THYROIDAB in the last 72 hours. Anemia Panel: No results for input(s): VITAMINB12, FOLATE, FERRITIN, TIBC, IRON, RETICCTPCT in the last 72 hours. Sepsis Labs: No results for input(s): PROCALCITON, LATICACIDVEN in the last 168 hours.  Recent Results (from the past 240 hour(s))  SARS Coronavirus 2 by  RT PCR (hospital order, performed in [facility] hospital lab) Nasopharyngeal Nasopharyngeal Swab     Status: None   Collection Time: 06/11/19 12:31 AM   Specimen: Nasopharyngeal Swab  Result Value Ref Range Status   SARS Coronavirus 2 NEGATIVE NEGATIVE Final    Comment: (NOTE) If result is NEGATIVE SARS-CoV-2 target nucleic acids are NOT DETECTED. The SARS-CoV-2 RNA is generally detectable in upper and lower  respiratory specimens during the acute phase of infection. The lowest  concentration of SARS-CoV-2 viral copies this assay can detect is 250  copies / mL. A negative result does not preclude SARS-CoV-2 infection  and should not be used as the sole basis for treatment or other  patient management decisions.  A negative result may occur with  improper specimen collection / handling, submission of specimen other  than nasopharyngeal swab, presence of viral mutation(s) within the  areas targeted by this assay, and inadequate number of viral copies  (<250 copies / mL). A negative result must be combined with clinical  observations, patient history, and epidemiological information. If result is POSITIVE SARS-CoV-2 target nucleic acids are DETECTED. The SARS-CoV-2 RNA is generally detectable in upper and lower  respiratory specimens dur ing the acute phase of infection.  Positive  results are indicative of active infection with SARS-CoV-2.  Clinical  correlation with patient history and other diagnostic information is  necessary to determine patient infection status.  Positive results do  not rule out bacterial infection or co-infection with other viruses. If result is PRESUMPTIVE POSTIVE SARS-CoV-2 nucleic acids MAY BE PRESENT.   A presumptive positive result was obtained on the submitted specimen  and confirmed on repeat testing.  While 2019 novel coronavirus  (SARS-CoV-2) nucleic acids may be present in the submitted sample  additional confirmatory testing may be necessary for  epidemiological  and / or clinical management purposes  to differentiate between  SARS-CoV-2 and other Sarbecovirus currently known to infect humans.  If clinically indicated additional testing with an alternate test  methodology [phone]) is advised. The SARS-CoV-2 RNA is generally  detectable in upper and lower respiratory sp ecimens during the acute  phase of infection. The expected result is Negative. Fact Sheet for Patients:  StrictlyIdeas.no Fact Sheet for Healthcare Providers: BankingDealers.co.za This test is not yet approved or cleared by the [place] FDA and has been authorized for detection and/or diagnosis of SARS-CoV-2 by FDA under an Emergency Use Authorization (EUA).  This EUA will remain in effect (meaning this test can be used) for the duration of the COVID-19 declaration under Section 564(b)(1) of the Act, 21 U.S.C. section 360bbb-3(b)(1), unless the authorization is terminated or revoked sooner. Performed at [facility] [place] [address], [place], [place] [address]          Radiology  Studies: No results found.      Scheduled Meds:  acetaminophen  650 mg Oral Q8H   cephALEXin  500 mg Oral TID   docusate sodium  100 mg Oral BID   donepezil  10 mg Oral QPM   feeding supplement (ENSURE ENLIVE)  237 mL Oral BID BM   multivitamin with minerals  1 tablet Oral Daily   osimertinib mesylate  80 mg Oral Daily   rivaroxaban  20 mg Oral QPM   Continuous Infusions:  lactated ringers 10 mL/hr at 06/11/19 1028   methocarbamol (ROBAXIN) IV       LOS: 4 days    Time spent: 25-30 min    Ezekiel Slocumb, DO [facility] Pager: [phone]  If 7PM-7AM, please contact night-coverage [email or web address] Password [email or web address] 06/15/2019, 7:25 AM

## 2019-06-15 NOTE — Plan of Care (Signed)
  Problem: Elimination: Goal: Will not experience complications related to bowel motility Outcome: Progressing Goal: Will not experience complications related to urinary retention Outcome: Progressing   

## 2019-06-15 NOTE — Care Management Important Message (Signed)
Important Message  Patient Details  Name: Valerie [name] MRN: [id number] Date of Birth: Jun 30, 1946   Medicare Important Message Given:  Yes     Orbie Pyo 06/15/2019, 3:39 PM

## 2019-06-16 ENCOUNTER — Inpatient Hospital Stay (HOSPITAL_COMMUNITY): Payer: Medicare Other

## 2019-06-16 DIAGNOSIS — K529 Noninfective gastroenteritis and colitis, unspecified: Secondary | ICD-10-CM

## 2019-06-16 DIAGNOSIS — R05 Cough: Secondary | ICD-10-CM

## 2019-06-16 LAB — BASIC METABOLIC PANEL
Anion gap: 8 (ref 5–15)
BUN: 20 mg/dL (ref 8–23)
CO2: 25 mmol/L (ref 22–32)
Calcium: 8.9 mg/dL (ref 8.9–10.3)
Chloride: 103 mmol/L (ref 98–111)
Creatinine, Ser: 0.75 mg/dL (ref 0.44–1.00)
GFR calc Af Amer: >60 mL/min (ref >60)
GFR calc non Af Amer: >60 mL/min (ref >60)
Glucose, Bld: 131 mg/dL — ABNORMAL HIGH (ref 70–99)
Potassium: 4.1 mmol/L (ref 3.5–5.1)
Sodium: 136 mmol/L (ref 135–145)

## 2019-06-16 MED ORDER — DOCUSATE SODIUM 100 MG PO CAPS
100.0000 mg | ORAL_CAPSULE | Freq: Every day | ORAL | Status: DC | PRN
  Administered 2019-06-16: 100 mg via ORAL
  Filled 2019-06-16: qty 1

## 2019-06-16 MED ORDER — DM-GUAIFENESIN ER 30-600 MG PO TB12
1.0000 | ORAL_TABLET | Freq: Two times a day (BID) | ORAL | Status: DC
  Administered 2019-06-16 – 2019-06-17 (×3): 1 via ORAL
  Filled 2019-06-16 (×4): qty 1

## 2019-06-16 MED ORDER — BISACODYL 10 MG RE SUPP: 10.0000 mg | Freq: Every day | RECTAL | Status: DC | PRN

## 2019-06-16 NOTE — TOC Progression Note (Addendum)
Transition of Care [facility]) - Progression Note    Patient Details  Name: Valerie [name] MRN: [id number] Date of Birth: Jun 25, 1946  Transition of Care [facility]) CM/SW [place] RN, BSN, NCM-BC, ACM-RN [phone] (working remotely) Phone Number: 06/16/2019, 1:16 PM  Clinical Narrative:    CM spoke to the patient/spouse [name]) to discuss the POC. The patient/spouse are now considering ST SNF for rehab; patient is currently a Max assist x 2, with the patients spouse indicating his concerns of being able to assist with the patients needs post-discharge. Patient/spouse agreeable to SNF options in [facility]; FL2/PASRR completed and faxed to [facility] as requested. Insurance auth initiated with [employer]. CM will discuss bed offers with the patient/spouse once obtained. CM team will continue to follow.    ADDENDUM: 06/16/19 @ 1510-Natalie Gay RNCM-Patients daughter, Tyrone Schimke, is requesting to discuss the POC with the CM; verbal permission to discuss. Patient is now requesting to transition home, with HH/DME, with care to be provided by the patients family; the family is also contacting agencies for additional support. CMS HH Compare and DME list provided with no preference. DME referral given to [place] ([place] liaison); [place] referral given to Sharyn Lull RN ([facility] liaison); AVS updated  Expected Discharge Plan: [place] Barriers to Discharge: No Barriers Identified  Expected Discharge Plan and Services Expected Discharge Plan: [place] In-house Referral: NA Discharge Planning Services: CM Consult Post Acute Care Choice: [place] arrangements for the past 2 months: Single Family Home    Social Determinants of Health (SDOH) Interventions    Readmission Risk Interventions No flowsheet data found.

## 2019-06-16 NOTE — Plan of Care (Signed)

## 2019-06-16 NOTE — NC FL2 (Signed)
[place] LEVEL OF CARE SCREENING TOOL     IDENTIFICATION  Patient Name: Valerie [name] Birthdate: 1945/12/02 Sex: female Admission Date (Current Location): 06/10/2019  [facility] and [place] Number:  Herbalist and Address:  [facility] [place], [place] [address], [place], [place] [address]      Provider Number: [id number]  Attending Physician Name and Address:  Ezekiel Slocumb, DO  Relative Name and Phone Number:  Shelita Steptoe [phone]    Current Level of Care: Hospital Recommended Level of Care: [place] Prior Approval Number:    Date Approved/Denied:   PASRR Number: [id number]  Discharge Plan: SNF    Current Diagnoses: Patient Active Problem List   Diagnosis Date Noted  . Acute blood loss anemia 06/12/2019  . Closed fracture of distal end of right femur, initial encounter ([place]) 06/11/2019  . Closed fracture of lateral portion of right tibial plateau 06/11/2019  . History of DVT of lower extremity 06/11/2019  . Blurry vision 01/06/2019  . Headache 07/24/2016  . Chronic diarrhea 08/24/2015  . Sepsis ([place])   . Encounter for antineoplastic chemotherapy 01/31/2015  . Urinary retention 10/20/2014  . Ileus ([place]) 10/20/2014  . Cellulitis of leg, right 10/20/2014  . Fracture of right proximal fibula   . Ankle fracture 10/02/2014  . Ankle fracture 10/02/2014  . Lung cancer ([place]) 10/02/2014  . Closed fibular fracture 10/02/2014  . Dementia ([place]) 10/02/2014  . Lower urinary tract infectious disease 10/02/2014  . Reactive airway disease 10/02/2014  . Fall 10/02/2014  . Closed left ankle fracture 10/01/2014  . Right fibular fracture 10/01/2014  . Lung metastasis ([place])   . LESION, ANUS 04/26/2009  . DIARRHEA 04/26/2009  . Primary cancer of right upper lobe of lung ([place]) 04/22/2009  . Malignant neoplasm of brain ([place]) 04/22/2009  . VITAMIN D DEFICIENCY 04/22/2009  . Dementia ([place]) 04/22/2009  . ANAL  FISTULA 04/22/2009  . OSTEOPOROSIS 04/22/2009    Orientation RESPIRATION BLADDER Height & Weight     Self, Time, Situation, Place  Normal Continent, External catheter Weight: 78.5 kg Height:  5\' 8"  (172.7 cm)  BEHAVIORAL SYMPTOMS/MOOD NEUROLOGICAL BOWEL NUTRITION STATUS  Other (Comment)(N/A) (N/A) Continent Diet(DYS 3)  AMBULATORY STATUS COMMUNICATION OF NEEDS Skin   Extensive Assist Verbally Surgical wounds(Right leg incision)                       Personal Care Assistance Level of Assistance  Bathing, Feeding, Dressing Bathing Assistance: Maximum assistance Feeding assistance: Limited assistance Dressing Assistance: Maximum assistance     Functional Limitations Info  Sight, Hearing, Speech Sight Info: Adequate Hearing Info: Adequate Speech Info: Adequate    SPECIAL CARE FACTORS FREQUENCY  PT (By licensed PT), OT (By licensed OT)     PT Frequency: Min 2X/week OT Frequency: Min 2X/week            Contractures Contractures Info: Not present    Additional Factors Info  Code Status, Allergies Code Status Info: Full Code Allergies Info: NKDA           Current Medications (06/16/2019):  This is the current hospital active medication list Current Facility-Administered Medications  Medication Dose Route Frequency Provider Last Rate Last Dose  . acetaminophen (TYLENOL) tablet 650 mg  650 mg Oral Q8H Ainsley Spinner, PA-C   650 mg at 06/16/19 1214  . bisacodyl (DULCOLAX) suppository 10 mg  10 mg Rectal Daily PRN Nicole Kindred A, DO      .  cephALEXin (KEFLEX) capsule 500 mg  500 mg Oral TID Prudencio Burly III, PA-C   500 mg at 06/16/19 5465  . docusate sodium (COLACE) capsule 100 mg  100 mg Oral Daily PRN Nicole Kindred A, DO   100 mg at 06/16/19 0934  . donepezil (ARICEPT) tablet 10 mg  10 mg Oral QPM Prudencio Burly III, PA-C   10 mg at 06/15/19 1707  . feeding supplement (ENSURE ENLIVE) (ENSURE ENLIVE) liquid 237 mL  237 mL Oral BID BM Nicole Kindred A, DO   237 mL at 06/16/19 0934  . HYDROcodone-acetaminophen (NORCO/VICODIN) 5-325 MG per tablet 1-2 tablet  1-2 tablet Oral Q6H PRN Prudencio Burly III, PA-C      . lactated ringers infusion   Intravenous Continuous Prudencio Burly III, PA-C 10 mL/hr at 06/11/19 1028    . methocarbamol (ROBAXIN) tablet 500 mg  500 mg Oral Q6H PRN Prudencio Burly III, PA-C   500 mg at 06/16/19 1214   Or  . methocarbamol (ROBAXIN) 500 mg in dextrose 5 % 50 mL IVPB  500 mg Intravenous Q6H PRN Prudencio Burly III, PA-C      . metoCLOPramide (REGLAN) tablet 5-10 mg  5-10 mg Oral Q8H PRN Prudencio Burly III, PA-C       Or  . metoCLOPramide (REGLAN) injection 5-10 mg  5-10 mg Intravenous Q8H PRN Prudencio Burly III, PA-C   10 mg at 06/12/19 0548  . morphine 2 MG/ML injection 0.5-1 mg  0.5-1 mg Intravenous Q2H PRN Prudencio Burly III, PA-C      . multivitamin with minerals tablet 1 tablet  1 tablet Oral Daily Nicole Kindred A, DO   1 tablet at 06/16/19 0934  . ondansetron (ZOFRAN) tablet 4 mg  4 mg Oral Q6H PRN Prudencio Burly III, PA-C   4 mg at 06/14/19 1215   Or  . ondansetron (ZOFRAN) injection 4 mg  4 mg Intravenous Q6H PRN Prudencio Burly III, PA-C   4 mg at 06/12/19 0015  . osimertinib mesylate (TAGRISSO) tablet 80 mg  80 mg Oral Daily Nicole Kindred A, DO   80 mg at 06/16/19 0953  . polyethylene glycol (MIRALAX / GLYCOLAX) packet 17 g  17 g Oral Daily PRN Nicole Kindred A, DO      . rivaroxaban (XARELTO) tablet 20 mg  20 mg Oral QPM Prudencio Burly III, PA-C   20 mg at 06/15/19 1705     Discharge Medications: Please see discharge summary for a list of discharge medications.  Relevant Imaging Results:  Relevant Lab Results:   Additional Information SSN: [id number]  [place], BSN, NCM-BC, ACM-RN [phone]

## 2019-06-16 NOTE — Care Management (Deleted)
    Durable Medical Equipment  (From admission, onward)         Start     Ordered   06/16/19 1543  For home use only DME Hospital bed  Once    Question Answer Comment  Length of Need 12 Months   Patient has (list medical condition): R distal femur fx and R tibial plateau fx   The above medical condition requires: Patient requires the ability to reposition frequently   Head must be elevated greater than: 45 degrees   Bed type Semi-electric   Hoyer Lift Yes   Support Surface: Gel Overlay      06/16/19 1542   06/12/19 1637  For home use only DME Other see comment  Once    Comments: Hoyer lift  Question:  Length of Need  Answer:  12 Months   06/12/19 1643   06/12/19 1636  For home use only DME lightweight manual wheelchair with seat cushion  Once    Comments: Patient suffers from a R distal femur fx and R tibial plateau fx which impairs their ability to perform daily activities like toileting, feeding, dressing, grooming, bathing in the home.  A cane, walker, crutch will not resolve  issue with performing activities of daily living. A wheelchair will allow patient to safely perform daily activities. Patient is not able to propel themselves in the home using a standard weight wheelchair due to arm weakness, general weakness, endurance. Patient can self propel in the lightweight wheelchair.  Length of need: Lifetime  Accessories: elevating leg rests (ELRs), wheel locks, extensions and anti-tippers.   06/12/19 1643

## 2019-06-16 NOTE — Progress Notes (Signed)
   06/16/19 1240  SNF Authorization Status  SNF Authorization Type Transition of Care CM/SW Authorization Request  SNF Auth Started 06/16/19  SNF Auth Start Time 35  SNF Authorization Status Started   Insurance auth initiated for ST SNF placement; requested documentation faxed to [place].   Midge Minium RN, BSN, NCM-BC, ACM-RN [phone]

## 2019-06-16 NOTE — Progress Notes (Signed)
Physical Therapy Treatment Patient Details Name: Valerie [name] MRN: [id number] DOB: 1945-10-09 Today's Date: 06/16/2019    History of Present Illness Pt is a 73 y/o female admitted 06/10/19 after falling at home in which she sustained a R distal femur fx and R tibial plateau fx. Pt is now s/p ORIF R LE and NWB R LE. PMH includes dementia, metastatic NSCL cancer and hx of resection of a brain tumor.   PT Comments    Pt slowly progressing with mobility. Able to sit EOB, requiring max-totalA for bed mobility and sitting balance; further mobility limited by fatigue. Supine BLE therex, including R hip/knee PROM; pt with difficulty activating RLE movement. Recommend OOB tranfers with maximove lift. Continue to recommend SNF-level therapies to maximize functional mobility and independence. If pt to return home, will require max-total assist for mobility/ADL tasks and DME listed below.   Follow Up Recommendations  SNF;Supervision for mobility/OOB(if not agreeable, will require max HH services)     Equipment Recommendations  Wheelchair (measurements PT);Wheelchair cushion (measurements PT);Hospital bed;Other (comment)(hoyer lift)    Recommendations for Other Services       Precautions / Restrictions Precautions Precautions: Fall Restrictions Weight Bearing Restrictions: Yes RLE Weight Bearing: Touchdown weight bearing Other Position/Activity Restrictions: Rknee hinged brace, unrestricted knee ROM    Mobility  Bed Mobility Overal bed mobility: Needs Assistance Bed Mobility: Supine to Sit;Sit to Supine     Supine to sit: Max assist;HOB elevated Sit to supine: Max assist;HOB elevated   General bed mobility comments: Significant increased time and effort, cues for all aspects of sequencing, maxA to navigate RLE and assist trunk; maxA for return to supine; when scooting up in bed, pt able to assist with BUEs pulling on top bed rail in trendelenberg position  Transfers                 General transfer comment: (Unable without +2 assist, too fatigued requiring return to supine and limiting transfer with lift; recommend use of maximove lift for OOB transfers with nursing staff)  Ambulation/Gait                 Stairs             Wheelchair Mobility    Modified Rankin (Stroke Patients Only)       Balance Overall balance assessment: Needs assistance;History of Falls Sitting-balance support: Feet supported;Bilateral upper extremity supported Sitting balance-Leahy Scale: Poor Sitting balance - Comments: Poor static trunk control, easily fatigued sitting EOB with forward flexed posture, minA quickly progressing to maxA for sitting balance                                    Cognition Arousal/Alertness: Awake/alert Behavior During Therapy: Flat affect Overall Cognitive Status: History of cognitive impairments - at baseline Area of Impairment: Attention;Following commands;Safety/judgement;Awareness;Problem solving                   Current Attention Level: Sustained;Selective Memory: Decreased recall of precautions Following Commands: Follows one step commands with increased time Safety/Judgement: Decreased awareness of safety;Decreased awareness of deficits Awareness: Emergent Problem Solving: Slow processing;Decreased initiation;Difficulty sequencing;Requires verbal cues;Requires tactile cues        Exercises Other Exercises Other Exercises: AROM LLE heel slides with knee ext against resistance, L hip abd/add (noted LLE weakness and encouraged pt perform these on her own); passive ROM R hip flex, R knee flex/ext (pt tolerated  well recently received pain meds; no active RLE movement noted) Other Exercises: R knee bledsoe brace adjusted; pt left with BLEs positioned to encourage hip IR/ADD, knee ext    General Comments General comments (skin integrity, edema, etc.): Discussed d/c plans as noted patient and family want  her to return home with [facility] services; continue to recommend SNF-level therapies and pt now in agreement stating, "I know I need it... I'm going to be realistic..."      Pertinent Vitals/Pain Pain Assessment: Faces Faces Pain Scale: Hurts little more Pain Location: RLE with mobility/ROM Pain Descriptors / Indicators: Guarding;Grimacing Pain Intervention(s): Monitored during session;Repositioned;Premedicated before session    Home Living                      Prior Function            PT Goals (current goals can now be found in the care plan section) Progress towards PT goals: Progressing toward goals(Limited by fatigue)    Frequency    Min 3X/week      PT Plan Current plan remains appropriate;Frequency needs to be updated    Co-evaluation              AM-PAC PT "6 Clicks" Mobility   Outcome Measure  Help needed turning from your back to your side while in a flat bed without using bedrails?: A Lot Help needed moving from lying on your back to sitting on the side of a flat bed without using bedrails?: A Lot Help needed moving to and from a bed to a chair (including a wheelchair)?: Total Help needed standing up from a chair using your arms (e.g., wheelchair or bedside chair)?: Total Help needed to walk in hospital room?: Total Help needed climbing 3-5 steps with a railing? : Total 6 Click Score: 8    End of Session   Activity Tolerance: Patient limited by fatigue Patient left: in bed;with call bell/phone within reach;with bed alarm set Nurse Communication: Mobility status;Need for lift equipment PT Visit Diagnosis: Other abnormalities of gait and mobility (R26.89);Pain Pain - Right/Left: Right Pain - part of body: Leg     Time: 1445-1525 PT Time Calculation (min) (ACUTE ONLY): 40 min  Charges:  $Therapeutic Exercise: 8-22 mins $Therapeutic Activity: 23-37 mins                    Mabeline Caras, PT, DPT Acute Rehabilitation Services  Pager  [phone] Office [place] 06/16/2019, 3:43 PM

## 2019-06-16 NOTE — Progress Notes (Signed)
PROGRESS NOTE    Valerie [name]  UXL:[id number] DOB: June 15, 1946 DOA: 06/10/2019  PCP: [name], MD    LOS - 5   Brief Narrative: 72 year old female patient with PMH of metastatic non-small cell lung cancer s/p lobectomy(on Tagrisso oral chemotherapy), resection of brain tumor, DVT on Xarelto, dementia and recurrent UTIs, recently on Keflex for E. coli UTI.She presented [facility] ED due to acute onset of right knee pain after she slipped at home while getting out of the shower and fell without hitting her head. She was admitted for right distal femur and tibial plateau fracture. Orthopedics advised transfer to [facility] for surgery.  Xarelto was held perioperatively. Patient underwent surgical repair of the surgery afternoon of 10/22. Postop, patient is to remain nonweightbearing with brace on at all times. At baseline patient ambulates with a rolling walker as needed and is independent in ADLs. PT/OT evaluated patient 10/23 and recommend SNF on discharge as patient is max assist x2 for bed mobility and transfers. Patient and her family prefer not for patient to go due to Covid and restricted visitation. Case management working on home health services. Follow-up with Dr. Percell Miller in clinic in 10 to 14 days.  Of note, in the ED reported she had been havingurinary urgency and frequency recently, was found to have UTI secondary to E. coli, and is completing a course of Keflex. On chart review most recent UA and culture from 10/5, showing pansensitive E. coli as well as Enterococcus. Patient currently denies symptoms, but will cover for Enterococcus if symptoms recur.  Subjective 10/27: Patient seen and examined, husband at bedside.  She reports no sleep overnight due to aching back and leg pain and unable to get comfortable.  Reports nausea but no vomiting today.  Still having she thinks watery diarrhea.  Question whether this is due to stool softeners or  not.  No fevers or chills.  States she started coughing last night, sounds productive.  No fevers or chills, chest pain or shortness of breath.  Assessment & Plan:   Principal Problem:   Closed fracture of distal end of right femur, initial encounter ([place]) Active Problems:   Closed fracture of lateral portion of right tibial plateau   Primary cancer of right upper lobe of lung ([place])   Lower urinary tract infectious disease   History of DVT of lower extremity   Acute blood loss anemia   Dementia (HCC)   Cough   Chronic diarrhea   Cough -patient reports onset of productive cough overnight, no fevers or chills.  Chest x-ray obtained today and negative.  Mucinex added.  Continue to monitor.  Diarrhea -per chart review, patient has chronic diarrhea.  Has received stool softeners postoperatively despite reports of watery diarrhea for the past few days.  Suspect this is the reason for her diarrhea, but is frequently on Keflex for recurrent UTIs so will check C. Difficile. D/c'd bowel regimen.  Pending.  Right distal femur fracture Right tibial plateau fracturedue to Mechanical fall-status post ORIF on 10/22 with Dr. Percell Miller. Evaluated by PT 10/23, noted to be max assist x2 with all bed mobility and transfers. SNF recommended on discharge, however patient and family want to return home with home health services.    -Pain management:  Tylenol as needed for mild pain Norco as needed for moderate pain Morphine 10 mg IV as needed for severe pain -Bowel regimen -Discharge planning ongoing, as of this afternoon plans are home with home health, hospital  bed, and all needed equipment with full assistance of family.  Acute blood loss anemia- stable. Hbg 13.3 last month, 11.3 on presentation, now stable 8.7 to 9 past few days. No sign of bleeding at this time. -Follow-up CBC -transfuse if hemoglobin less than 7  E. coli/Enterococcus faecalis UTI-improved, patient asymptomatic at this  time. Continue Keflex for E. coli coverage through 10/26. If symptoms recur, would cover for Enterococcus.  Of note, has recurrent UTI's, on Keflex "as needed" at home.  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 Currently on oral chemotherapy, with Tagrisso. Followed by oncologist, Dr. Earlie Server.Recent MRI showed no new brain mets. Chest x-ray this encounter showed postsurgical changes that are stable and no other acute findings. -Continue home Tagrisso  History of DVT-on Xarelto. Last dose 10/21 8 PM. Xarelto held perioperatively and resumed. No evidence to suggest acute DVT.   Dementia-appears mild and stable, without behavioral disturbance -Continue home Aricept   DVT prophylaxis:On Xarelto Code Status:Full Code Family Communication:Husband at bedside updated today Disposition Plan:Home with home health and family support when hospital bed, etc. Ready.  Ordered today. Medically stable for discharge if C diff negative.   Consultants:  Orthopedics  Procedures:  ORIF of right distal femur fracture, 10/22  Antimicrobials:  Keflex, start 10/22, stop 10/26    Objective: Vitals:   06/16/19 0358 06/16/19 0432 06/16/19 0755 06/16/19 1500  BP: 126/60  126/79 128/82  Pulse: (!) 114 90 96 95  Resp: '16  14 16  '$ Temp: 98.6 F (37 C)  98.9 F (37.2 C) 98.5 F (36.9 C)  TempSrc:   Oral Oral  SpO2: 98%  100% 100%  Weight:      Height:        Intake/Output Summary (Last 24 hours) at 06/16/2019 1800 Last data filed at 06/16/2019 1500 Gross per 24 hour  Intake 360 ml  Output 800 ml  Net -440 ml   Filed Weights   06/10/19 2311  Weight: 78.5 kg    Examination:  General exam: awake, alert, no acute distress, chronically ill appearing HEENT: moist mucus membranes, hearing grossly normal  Respiratory system: clear to auscultation anteriorly, no wheezes, rales or rhonchi,  normal respiratory effort Cardiovascular system: normal S1/S2, RRR, palpable heart beat at LUSB, no JVD, murmurs, rubs, gallops Gastrointestinal system: soft, non-tender, non-distended abdomen, normal bowel sounds Central nervous system: alert and oriented x4. no gross focal neurologic deficits, normal speech Extremities: right lower extremity in brace, distal sensation and perfusion intact.  Psychiatry: depressed mood, congruent affect, judgement and insight appear normal    Data Reviewed: I have personally reviewed following labs and imaging studies  CBC: Recent Labs  Lab 06/10/19 2317  06/12/19 0259 06/13/19 8119 06/14/19 0553 06/15/19 0445 06/15/19 1643  WBC 7.2   < > 7.5 7.3 6.0 6.2 6.3  NEUTROABS 5.9  --   --  6.1 4.6  --   --   HGB 11.3*   < > 9.2* 9.1* 8.8* 8.7* 8.8*  HCT 37.1   < > 30.2* 28.6* 27.1* 27.1* 27.8*  MCV 97.1   < > 97.7 92.9 92.8 91.9 93.3  PLT 193   < > 166 165 177 219 224   < > = values in this interval not displayed.   Basic Metabolic Panel: Recent Labs  Lab 06/12/19 0259 06/13/19 0632 06/14/19 0553 06/15/19 0445 06/16/19 0359  NA 137 137 137 138 136  K 3.8 3.6 3.6 4.2 4.1  CL 106  103 103 103 103  CO2 '23 24 26 25 25  '$ GLUCOSE 144* 137* 109* 113* 131*  BUN '10 12 16 23 20  '$ CREATININE 0.71 0.81 0.76 0.76 0.75  CALCIUM 8.6* 8.8* 8.6* 8.9 8.9  MG  --  2.1  --  2.4  --    GFR: Estimated Creatinine Clearance: 68.9 mL/min (by C-G formula based on SCr of 0.75 mg/dL). Liver Function Tests: No results for input(s): AST, ALT, ALKPHOS, BILITOT, PROT, ALBUMIN in the last 168 hours. No results for input(s): LIPASE, AMYLASE in the last 168 hours. No results for input(s): AMMONIA in the last 168 hours. Coagulation Profile: No results for input(s): INR, PROTIME in the last 168 hours. Cardiac Enzymes: No results for input(s): CKTOTAL, CKMB, CKMBINDEX, TROPONINI in the last 168 hours. BNP (last 3 results) No results for input(s): PROBNP in the last 8760  hours. HbA1C: No results for input(s): HGBA1C in the last 72 hours. CBG: No results for input(s): GLUCAP in the last 168 hours. Lipid Profile: No results for input(s): CHOL, HDL, LDLCALC, TRIG, CHOLHDL, LDLDIRECT in the last 72 hours. Thyroid Function Tests: No results for input(s): TSH, T4TOTAL, FREET4, T3FREE, THYROIDAB in the last 72 hours. Anemia Panel: No results for input(s): VITAMINB12, FOLATE, FERRITIN, TIBC, IRON, RETICCTPCT in the last 72 hours. Sepsis Labs: No results for input(s): PROCALCITON, LATICACIDVEN in the last 168 hours.  Recent Results (from the past 240 hour(s))  SARS Coronavirus 2 by RT PCR (hospital order, performed in [facility] hospital lab) Nasopharyngeal Nasopharyngeal Swab     Status: None   Collection Time: 06/11/19 12:31 AM   Specimen: Nasopharyngeal Swab  Result Value Ref Range Status   SARS Coronavirus 2 NEGATIVE NEGATIVE Final    Comment: (NOTE) If result is NEGATIVE SARS-CoV-2 target nucleic acids are NOT DETECTED. The SARS-CoV-2 RNA is generally detectable in upper and lower  respiratory specimens during the acute phase of infection. The lowest  concentration of SARS-CoV-2 viral copies this assay can detect is 250  copies / mL. A negative result does not preclude SARS-CoV-2 infection  and should not be used as the sole basis for treatment or other  patient management decisions.  A negative result may occur with  improper specimen collection / handling, submission of specimen other  than nasopharyngeal swab, presence of viral mutation(s) within the  areas targeted by this assay, and inadequate number of viral copies  (<250 copies / mL). A negative result must be combined with clinical  observations, patient history, and epidemiological information. If result is POSITIVE SARS-CoV-2 target nucleic acids are DETECTED. The SARS-CoV-2 RNA is generally detectable in upper and lower  respiratory specimens dur ing the acute phase of infection.   Positive  results are indicative of active infection with SARS-CoV-2.  Clinical  correlation with patient history and other diagnostic information is  necessary to determine patient infection status.  Positive results do  not rule out bacterial infection or co-infection with other viruses. If result is PRESUMPTIVE POSTIVE SARS-CoV-2 nucleic acids MAY BE PRESENT.   A presumptive positive result was obtained on the submitted specimen  and confirmed on repeat testing.  While 2019 novel coronavirus  (SARS-CoV-2) nucleic acids may be present in the submitted sample  additional confirmatory testing may be necessary for epidemiological  and / or clinical management purposes  to differentiate between  SARS-CoV-2 and other Sarbecovirus currently known to infect humans.  If clinically indicated additional testing with an alternate test  methodology [phone]) is advised. The  SARS-CoV-2 RNA is generally  detectable in upper and lower respiratory sp ecimens during the acute  phase of infection. The expected result is Negative. Fact Sheet for Patients:  StrictlyIdeas.no Fact Sheet for Healthcare Providers: BankingDealers.co.za This test is not yet approved or cleared by the [place] FDA and has been authorized for detection and/or diagnosis of SARS-CoV-2 by FDA under an Emergency Use Authorization (EUA).  This EUA will remain in effect (meaning this test can be used) for the duration of the COVID-19 declaration under Section 564(b)(1) of the Act, 21 U.S.C. section 360bbb-3(b)(1), unless the authorization is terminated or revoked sooner. Performed at [facility], [place] [address], [place], [place] [address]          Radiology Studies: Dg Chest Port 1 View  Result Date: 06/16/2019 CLINICAL DATA:  New onset cough EXAM: PORTABLE CHEST 1 VIEW COMPARISON:  06/11/2019 FINDINGS: Elevation of the left hemidiaphragm with left base  atelectasis. Right lung clear. Heart is borderline in size. No visible effusions or acute bony abnormality. IMPRESSION: Stable elevation of the left hemidiaphragm with left base atelectasis. Electronically Signed   By: Rolm Baptise M.D.   On: 06/16/2019 13:51        Scheduled Meds: . acetaminophen  650 mg Oral Q8H  . cephALEXin  500 mg Oral TID  . donepezil  10 mg Oral QPM  . feeding supplement (ENSURE ENLIVE)  237 mL Oral BID BM  . multivitamin with minerals  1 tablet Oral Daily  . osimertinib mesylate  80 mg Oral Daily  . rivaroxaban  20 mg Oral QPM   Continuous Infusions: . lactated ringers 10 mL/hr at 06/11/19 1028  . methocarbamol (ROBAXIN) IV       LOS: 5 days    Time spent: 30-35 min    Ezekiel Slocumb, DO [facility] Pager: [phone]  If 7PM-7AM, please contact night-coverage [email or web address] Password [email or web address] 06/16/2019, 6:00 PM

## 2019-06-16 NOTE — Care Management (Signed)
    Durable Medical Equipment  (From admission, onward)         Start     Ordered   06/16/19 1543  For home use only DME Hospital bed  Once    Question Answer Comment  Length of Need 12 Months   Patient has (list medical condition): R distal femur fx and R tibial plateau fx   The above medical condition requires: Patient requires the ability to reposition frequently   Head must be elevated greater than: 45 degrees   Bed type Semi-electric   Hoyer Lift Yes   Support Surface: Gel Overlay      06/16/19 1542   06/12/19 1637  For home use only DME Other see comment  Once    Comments: Hoyer lift  Question:  Length of Need  Answer:  12 Months   06/12/19 1643   06/12/19 1636  For home use only DME lightweight manual wheelchair with seat cushion  Once    Comments: Patient suffers from a R distal femur fx and R tibial plateau fx which impairs their ability to perform daily activities like toileting, feeding, dressing, grooming, bathing in the home.  A cane, walker, crutch will not resolve  issue with performing activities of daily living. A wheelchair will allow patient to safely perform daily activities. Patient is not able to propel themselves in the home using a standard weight wheelchair due to arm weakness, general weakness, endurance. Patient can self propel in the lightweight wheelchair.  Length of need: Lifetime  Accessories: elevating leg rests (ELRs), wheel locks, extensions and anti-tippers.   06/12/19 1643

## 2019-06-17 LAB — CBC WITH DIFFERENTIAL/PLATELET
Abs Immature Granulocytes: 0.04 10*3/uL (ref 0.00–0.07)
Basophils Absolute: 0 10*3/uL (ref 0.0–0.1)
Basophils Relative: 0 %
Eosinophils Absolute: 0.1 10*3/uL (ref 0.0–0.5)
Eosinophils Relative: 1 %
HCT: 26.5 % — ABNORMAL LOW (ref 36.0–46.0)
Hemoglobin: 8.7 g/dL — ABNORMAL LOW (ref 12.0–15.0)
Immature Granulocytes: 0 %
Lymphocytes Relative: 5 %
Lymphs Abs: 0.5 10*3/uL — ABNORMAL LOW (ref 0.7–4.0)
MCH: 29.7 pg (ref 26.0–34.0)
MCHC: 32.8 g/dL (ref 30.0–36.0)
MCV: 90.4 fL (ref 80.0–100.0)
Monocytes Absolute: 0.9 10*3/uL (ref 0.1–1.0)
Monocytes Relative: 9 %
Neutro Abs: 8.6 10*3/uL — ABNORMAL HIGH (ref 1.7–7.7)
Neutrophils Relative %: 85 %
Platelets: 246 10*3/uL (ref 150–400)
RBC: 2.93 MIL/uL — ABNORMAL LOW (ref 3.87–5.11)
RDW: 13.8 % (ref 11.5–15.5)
WBC: 10.1 10*3/uL (ref 4.0–10.5)
nRBC: 0 % (ref 0.0–0.2)

## 2019-06-17 MED ORDER — FUROSEMIDE 10 MG/ML IJ SOLN: 40.0000 mg | Freq: Two times a day (BID) | INTRAMUSCULAR | Status: DC

## 2019-06-17 NOTE — TOC Progression Note (Signed)
Transition of Care [facility]) - Progression Note    Patient Details  Name: Valerie [name] MRN: [id number] Date of Birth: March 10, 1946  Transition of Care [facility]) CM/SW [place] RN, BSN, NCM-BC, [place] [phone] Phone Number: 06/17/2019, 3:48 PM  Clinical Narrative:    CM spoke to the patient/spouse via phone to discuss the POC and the patient has now decided to pursue ST SNF again. The hospital bed, hoyer lift and wheelchair had been delivered earlier today by [employer] with [place] arranged, but the patient/spouse are agreeable to ST SNF to assist with ADL needs (spouse does not feel he can accommodate the required needs post discharge). CM explained to the patient to avoid a delay in her discharge, confirmed DCP is needed so insurance auth for the facility can be obtained, with the patient verbalizing understanding. The patient/spouse has chosen [facility]; CM spoke to [place] [facility]) to inform of bed choice; contacedt [employer] and spoke to [facility], with facility information and therapy notes provided to complete the insurance auth. Patient will need a recent COVID test; PTAR will be arranged. CM team will continue to follow.    Expected Discharge Plan: Skilled Nursing Facility Barriers to Discharge: Ship broker, Continued Medical Work up  Expected Discharge Plan and Services Expected Discharge Plan: [place] In-house Referral: NA Discharge Planning Services: CM Consult Post Acute Care Choice: [place] Living arrangements for the past 2 months: [place]                 DME Arranged: Hospital bed, Wheelchair manual, Other see comment(hoyer lift) DME Agency: [employer] Date DME Agency Contacted: 06/16/19 Time DME Agency Contacted: [phone] Representative spoke with at DME Agency: [place]: OT, PT, Nurse's Aide, Refused SNF, Social Work [employer] Agency: [facility] Date [place]:  06/16/19 Time [place]: 1526 Representative spoke with at [place]: [place] (SDOH) Interventions    Readmission Risk Interventions No flowsheet data found.

## 2019-06-17 NOTE — Progress Notes (Signed)
PROGRESS NOTE    GRACEN RINGWALD   BJS:[id number]  DOB: 09-Oct-1945  DOA: 06/10/2019 PCP: [name], MD   Brief Narrative:  [place] 73 year old female patient with PMH of metastatic non-small cell lung cancer s/p lobectomy(on Tagrisso oral chemotherapy), resection of brain tumor, DVT on Xarelto, dementia and recurrent UTIs, recently on Keflex for E. coli UTI.She initially presented [facility] ED due to acute onset of right knee pain after she slipped at home while getting out of the shower and fell without hitting her head. She was admitted for right distal femur fracture and right tibial plateau fracture. Orthopedics was consulted and advised transferring to [facility] for surgery.  Xarelto was held perioperatively. Patient underwent surgical repair of the right distal femur fracture afternoon of 10/22. Postop, patient is to remain nonweightbearing with brace on at all times. At baseline patient ambulates with a rolling walker as needed and is independent in ADLs. PT/OT evaluated patient 10/23 and recommend SNF on discharge as patient is max assist x2 for bed mobility and transfers. Of note, in the ED reported she had been havingurinary urgency and frequency recently, was found to have UTI secondary to E. coli, and is completing a course of Keflex. On chart review most recent UA and culture from 10/5, showing pansensitive E. coli as well as Enterococcus. Patient currently denies symptoms, but will cover for Enterococcus if symptoms recur.  Subjective: She has no complaints today. Eating, drinking well, no vomiting or constipation. No pain.     Assessment & Plan:   Principal Problem:   Closed fracture of distal end of right femur, initial encounter - status post ORIF on 10/22 by Dr. Percell Miller - plan for SNF- SNF search being started today  Active Problems:   Acute blood loss anemia- asymptomatic - baseline Hb is 12-13 - it has dropped to  8.7 and is stable    Primary cancer of right upper lobe of lung - metastatic non small cell  - s/p lobectomy, resection of brain meds and chemoradiation - Currently on oral chemotherapy, with Tagrisso.  - Followed by , Dr. Earlie Server.  H/o DVT - Xarelto resumed after surgery    Dementia  - mild- cont Aricept    Lower urinary tract infectious disease- e coli and e faecalis - completed a 3 day course of Keflex   Time spent in minutes: 35 DVT prophylaxis:  Xarelto Code Status: Full code Family Communication:  Disposition Plan: SNF  Consultants:   ortho Procedures:   ORIF of right distal femur fracture, 10/22 Antimicrobials:  Anti-infectives (From admission, onward)   Start     Dose/Rate Route Frequency Ordered Stop   06/11/19 1030  ceFAZolin (ANCEF) IVPB 2g/100 mL premix     2 g 200 mL/hr over 30 Minutes Intravenous On call to O.R. 06/11/19 1018 06/11/19 1117   06/11/19 1027  ceFAZolin (ANCEF) 2-4 GM/100ML-% IVPB    Note to Pharmacy: Lisette Grinder   : cabinet override      06/11/19 1027 06/11/19 1047   06/11/19 1000  cephALEXin (KEFLEX) capsule 500 mg  Status:  Discontinued     500 mg Oral 3 times daily 06/11/19 0353 06/16/19 1801       Objective: Vitals:   06/16/19 0755 06/16/19 1500 06/16/19 2039 06/17/19 0500  BP: 126/79 128/82 128/70 116/73  Pulse: 96 95 (!) 118 98  Resp: 14 16 16 18   Temp: 98.9 F (37.2 C) 98.5 F (36.9 C) 97.6 F (36.4 C)  97.6 F (36.4 C)  TempSrc: Oral Oral Oral Oral  SpO2: 100% 100% 97% 99%  Weight:      Height:        Intake/Output Summary (Last 24 hours) at 06/17/2019 1553 Last data filed at 06/16/2019 2020 Gross per 24 hour  Intake 120 ml  Output 200 ml  Net -80 ml   Filed Weights   06/10/19 2311  Weight: 78.5 kg    Examination: General exam: Appears comfortable  HEENT: PERRLA, oral mucosa moist, no sclera icterus or thrush Respiratory system: Clear to auscultation. Respiratory effort normal. Cardiovascular system:  S1 & S2 heard, RRR.   Gastrointestinal system: Abdomen soft, non-tender, nondistended. Normal bowel sounds. Central nervous system: Alert and oriented. No focal neurological deficits. Extremities: No cyanosis, clubbing or edema- right leg in immobilizer  Skin: No rashes or ulcers Psychiatry:  Mood & affect appropriate.     Data Reviewed: I have personally reviewed following labs and imaging studies  CBC: Recent Labs  Lab 06/10/19 2317  06/13/19 0632 06/14/19 0553 06/15/19 0445 06/15/19 1643 06/17/19 0225  WBC 7.2   < > 7.3 6.0 6.2 6.3 10.1  NEUTROABS 5.9  --  6.1 4.6  --   --  8.6*  HGB 11.3*   < > 9.1* 8.8* 8.7* 8.8* 8.7*  HCT 37.1   < > 28.6* 27.1* 27.1* 27.8* 26.5*  MCV 97.1   < > 92.9 92.8 91.9 93.3 90.4  PLT 193   < > 165 177 219 224 246   < > = values in this interval not displayed.   Basic Metabolic Panel: Recent Labs  Lab 06/12/19 0259 06/13/19 0632 06/14/19 0553 06/15/19 0445 06/16/19 0359  NA 137 137 137 138 136  K 3.8 3.6 3.6 4.2 4.1  CL 106 103 103 103 103  CO2 23 24 26 25 25   GLUCOSE 144* 137* 109* 113* 131*  BUN 10 12 16 23 20   CREATININE 0.71 0.81 0.76 0.76 0.75  CALCIUM 8.6* 8.8* 8.6* 8.9 8.9  MG  --  2.1  --  2.4  --    GFR: Estimated Creatinine Clearance: 68.9 mL/min (by C-G formula based on SCr of 0.75 mg/dL). Liver Function Tests: No results for input(s): AST, ALT, ALKPHOS, BILITOT, PROT, ALBUMIN in the last 168 hours. No results for input(s): LIPASE, AMYLASE in the last 168 hours. No results for input(s): AMMONIA in the last 168 hours. Coagulation Profile: No results for input(s): INR, PROTIME in the last 168 hours. Cardiac Enzymes: No results for input(s): CKTOTAL, CKMB, CKMBINDEX, TROPONINI in the last 168 hours. BNP (last 3 results) No results for input(s): PROBNP in the last 8760 hours. HbA1C: No results for input(s): HGBA1C in the last 72 hours. CBG: No results for input(s): GLUCAP in the last 168 hours. Lipid Profile: No  results for input(s): CHOL, HDL, LDLCALC, TRIG, CHOLHDL, LDLDIRECT in the last 72 hours. Thyroid Function Tests: No results for input(s): TSH, T4TOTAL, FREET4, T3FREE, THYROIDAB in the last 72 hours. Anemia Panel: No results for input(s): VITAMINB12, FOLATE, FERRITIN, TIBC, IRON, RETICCTPCT in the last 72 hours. Urine analysis:    Component Value Date/Time   COLORURINE YELLOW 09/13/2018 1503   APPEARANCEUR HAZY (A) 09/13/2018 1503   LABSPEC 1.026 09/13/2018 1503   PHURINE 5.0 09/13/2018 1503   GLUCOSEU NEGATIVE 09/13/2018 1503   HGBUR LARGE (A) 09/13/2018 1503   BILIRUBINUR negative 05/25/2019 1653   KETONESUR 20 (A) 09/13/2018 1503   PROTEINUR Negative 05/25/2019 1653   PROTEINUR 30 (  A) 09/13/2018 1503   UROBILINOGEN 0.2 05/25/2019 1653   UROBILINOGEN 0.2 10/20/2014 1636   NITRITE negative 05/25/2019 1653   NITRITE NEGATIVE 09/13/2018 1503   LEUKOCYTESUR Trace (A) 05/25/2019 1653   Sepsis Labs: @LABRCNTIP (procalcitonin:4,lacticidven:4) ) Recent Results (from the past 240 hour(s))  SARS Coronavirus 2 by RT PCR (hospital order, performed in [facility] hospital lab) Nasopharyngeal Nasopharyngeal Swab     Status: None   Collection Time: 06/11/19 12:31 AM   Specimen: Nasopharyngeal Swab  Result Value Ref Range Status   SARS Coronavirus 2 NEGATIVE NEGATIVE Final    Comment: (NOTE) If result is NEGATIVE SARS-CoV-2 target nucleic acids are NOT DETECTED. The SARS-CoV-2 RNA is generally detectable in upper and lower  respiratory specimens during the acute phase of infection. The lowest  concentration of SARS-CoV-2 viral copies this assay can detect is 250  copies / mL. A negative result does not preclude SARS-CoV-2 infection  and should not be used as the sole basis for treatment or other  patient management decisions.  A negative result may occur with  improper specimen collection / handling, submission of specimen other  than nasopharyngeal swab, presence of viral mutation(s)  within the  areas targeted by this assay, and inadequate number of viral copies  (<250 copies / mL). A negative result must be combined with clinical  observations, patient history, and epidemiological information. If result is POSITIVE SARS-CoV-2 target nucleic acids are DETECTED. The SARS-CoV-2 RNA is generally detectable in upper and lower  respiratory specimens dur ing the acute phase of infection.  Positive  results are indicative of active infection with SARS-CoV-2.  Clinical  correlation with patient history and other diagnostic information is  necessary to determine patient infection status.  Positive results do  not rule out bacterial infection or co-infection with other viruses. If result is PRESUMPTIVE POSTIVE SARS-CoV-2 nucleic acids MAY BE PRESENT.   A presumptive positive result was obtained on the submitted specimen  and confirmed on repeat testing.  While 2019 novel coronavirus  (SARS-CoV-2) nucleic acids may be present in the submitted sample  additional confirmatory testing may be necessary for epidemiological  and / or clinical management purposes  to differentiate between  SARS-CoV-2 and other Sarbecovirus currently known to infect humans.  If clinically indicated additional testing with an alternate test  methodology [phone]) is advised. The SARS-CoV-2 RNA is generally  detectable in upper and lower respiratory sp ecimens during the acute  phase of infection. The expected result is Negative. Fact Sheet for Patients:  StrictlyIdeas.no Fact Sheet for Healthcare Providers: BankingDealers.co.za This test is not yet approved or cleared by the [place] FDA and has been authorized for detection and/or diagnosis of SARS-CoV-2 by FDA under an Emergency Use Authorization (EUA).  This EUA will remain in effect (meaning this test can be used) for the duration of the COVID-19 declaration under Section 564(b)(1) of the Act,  21 U.S.C. section 360bbb-3(b)(1), unless the authorization is terminated or revoked sooner. Performed at [facility], [place] [address] [place], [place] [address]          Radiology Studies: Dg Chest Port 1 View  Result Date: 06/16/2019 CLINICAL DATA:  New onset cough EXAM: PORTABLE CHEST 1 VIEW COMPARISON:  06/11/2019 FINDINGS: Elevation of the left hemidiaphragm with left base atelectasis. Right lung clear. Heart is borderline in size. No visible effusions or acute bony abnormality. IMPRESSION: Stable elevation of the left hemidiaphragm with left base atelectasis. Electronically Signed   By: Rolm Baptise  M.D.   On: 06/16/2019 13:51      Scheduled Meds: . acetaminophen  650 mg Oral Q8H  . dextromethorphan-guaiFENesin  1 tablet Oral BID  . donepezil  10 mg Oral QPM  . multivitamin with minerals  1 tablet Oral Daily  . osimertinib mesylate  80 mg Oral Daily  . rivaroxaban  20 mg Oral QPM   Continuous Infusions: . lactated ringers 10 mL/hr at 06/11/19 1028  . methocarbamol (ROBAXIN) IV       LOS: 6 days      Debbe Odea, MD [facility] Pager: [email or web address] Password [facility] 06/17/2019, 3:53 PM

## 2019-06-17 NOTE — Care Management (Signed)
Family is requesting transportation be arranged at discharge. CM will arrange PTAR once stable for discharge. Hospital bed, wheelchair and hoyer lift will be delivered to the home by [employer] today; [place] arranged with [facility] for HHPT/OT/SW/aide. CM team will continue to follow.  Midge Minium RN, BSN, NCM-BC, ACM-RN [phone]

## 2019-06-17 NOTE — Progress Notes (Signed)
Physical Therapy Treatment Patient Details Name: Valerie [name] MRN: [id number] DOB: 06-Nov-1945 Today's Date: 06/17/2019    History of Present Illness Pt is a 73 y/o female admitted 06/10/19 after falling at home in which she sustained a R distal femur fx and R tibial plateau fx. Pt is now s/p ORIF R LE and NWB R LE. PMH includes dementia, metastatic NSCL cancer and hx of resection of a brain tumor.    PT Comments    Pt in bed and agreeable to PT upon PT arrival. Pt completed multiple trials of B rolling in bed to complete pericare and maximove pad placement to complete transfer to chair. Pt was then able to participate in chair-level exercises for LE strengthening of LLE. Pt was able to demosntrate good technique and educated on importance of continued exercises while stationary. Pt was agreeable to edu, recommend continued skilled PT to address functional transfers, possibly with use of WC or slide board to improve pt independence.     Follow Up Recommendations  SNF;Supervision for mobility/OOB(if not agreeable to SNF, max HH services)     Equipment Recommendations  Wheelchair (measurements PT);Wheelchair cushion (measurements PT);Hospital bed;Other (comment)    Recommendations for Other Services       Precautions / Restrictions Precautions Precautions: Fall Restrictions Weight Bearing Restrictions: Yes RLE Weight Bearing: Touchdown weight bearing Other Position/Activity Restrictions: Rknee hinged brace, unrestricted knee ROM    Mobility  Bed Mobility Overal bed mobility: Needs Assistance Bed Mobility: Rolling Rolling: Max assist         General bed mobility comments: Pt able to participate in rolling with mod/maxA of 1 to position pad for maximove. Pt able to assist with movements and reaching but needs assist to complete movement.  Transfers Overall transfer level: Needs assistance     Sit to Stand: Max assist;Total assist;From elevated surface;+2 physical  assistance         General transfer comment: Pt used maximove to transfer to chair to complete seated exercises.  Ambulation/Gait                 Stairs             Wheelchair Mobility    Modified Rankin (Stroke Patients Only)       Balance Overall balance assessment: Needs assistance;History of Falls Sitting-balance support: Feet supported;Bilateral upper extremity supported Sitting balance-Leahy Scale: Poor Sitting balance - Comments: Pt unable to reposition in chair or lean forward , poor trunk control/strength even with support. Postural control: Posterior lean                                  Cognition Arousal/Alertness: Awake/alert Behavior During Therapy: Flat affect Overall Cognitive Status: History of cognitive impairments - at baseline Area of Impairment: Attention;Following commands;Safety/judgement;Awareness;Problem solving                   Current Attention Level: Sustained;Selective Memory: Decreased recall of precautions Following Commands: Follows one step commands with increased time Safety/Judgement: Decreased awareness of safety;Decreased awareness of deficits Awareness: Emergent Problem Solving: Slow processing;Decreased initiation;Difficulty sequencing;Requires verbal cues;Requires tactile cues        Exercises General Exercises - Lower Extremity Ankle Circles/Pumps: 10 reps;Seated;Both;AROM Quad Sets: AROM;Left;10 reps;Seated Heel Slides: AROM;5 reps;Left(2 x 5 reps)    General Comments        Pertinent Vitals/Pain Pain Assessment: Faces Faces Pain Scale: Hurts little more Pain Location: RLE  with mobility/ROM Pain Descriptors / Indicators: Guarding;Grimacing Pain Intervention(s): Repositioned;Monitored during session;Limited activity within patient's tolerance    Home Living                      Prior Function            PT Goals (current goals can now be found in the care plan  section) Acute Rehab PT Goals Patient Stated Goal: decrease pain PT Goal Formulation: With patient Time For Goal Achievement: 06/26/19 Potential to Achieve Goals: Fair Progress towards PT goals: Progressing toward goals    Frequency    Min 3X/week      PT Plan Current plan remains appropriate    Co-evaluation              AM-PAC PT "6 Clicks" Mobility   Outcome Measure  Help needed turning from your back to your side while in a flat bed without using bedrails?: A Lot Help needed moving from lying on your back to sitting on the side of a flat bed without using bedrails?: A Lot Help needed moving to and from a bed to a chair (including a wheelchair)?: Total Help needed standing up from a chair using your arms (e.g., wheelchair or bedside chair)?: Total Help needed to walk in hospital room?: Total Help needed climbing 3-5 steps with a railing? : Total 6 Click Score: 8    End of Session Equipment Utilized During Treatment: Other (comment)(maximove) Activity Tolerance: Patient limited by fatigue Patient left: in chair;with call bell/phone within reach Nurse Communication: Mobility status;Need for lift equipment PT Visit Diagnosis: Other abnormalities of gait and mobility (R26.89);Pain Pain - Right/Left: Right Pain - part of body: Leg     Time: 1336-1410 PT Time Calculation (min) (ACUTE ONLY): 34 min  Charges:  $Therapeutic Exercise: 8-22 mins $Therapeutic Activity: 8-22 mins                     Mickey Farber, PT, DPT   Acute Rehabilitation Department [phone]   Otho Bellows 06/17/2019, 3:08 PM

## 2019-06-18 LAB — SARS CORONAVIRUS 2 (TAT 6-24 HRS): SARS Coronavirus 2: NEGATIVE

## 2019-06-18 MED ORDER — IBUPROFEN 200 MG PO TABS: 400.0000 mg | ORAL_TABLET | Freq: Four times a day (QID) | ORAL | 0 refills | Status: DC | PRN

## 2019-06-18 MED ORDER — ACETAMINOPHEN 325 MG PO TABS: 650.0000 mg | ORAL_TABLET | ORAL | Status: DC | PRN

## 2019-06-18 MED ORDER — METHOCARBAMOL 500 MG PO TABS: 500.0000 mg | ORAL_TABLET | Freq: Four times a day (QID) | ORAL | Status: DC | PRN

## 2019-06-18 MED ORDER — DIPHENOXYLATE-ATROPINE 2.5-0.025 MG PO TABS
1.0000 | ORAL_TABLET | Freq: Once | ORAL | Status: AC
  Administered 2019-06-18: 1 via ORAL
  Filled 2019-06-18: qty 1

## 2019-06-18 NOTE — Progress Notes (Signed)
Pt report given to receiving [place] RN Jasmine;Pt in stable condition;not in respiratory distress; PTAR to transport pt; home med [facility] Nip is currently with the pt now from pharmacy.

## 2019-06-18 NOTE — Progress Notes (Signed)
   06/18/19 0806  SNF Authorization Status  SNF Authorization Complete 06/17/19  SNF Auth Complete Time 2297  SNF Authorization Status Complete   Insurance auth received for [facility]; Navi reference # [id number]  Midge Minium RN, BSN, NCM-BC, ACM-RN [phone]

## 2019-06-18 NOTE — Discharge Summary (Signed)
Physician Discharge Summary  Valerie [name]:[id number] DOB: Dec 14, 1945 DOA: 06/10/2019  PCP: [name], MD  Admit date: 06/10/2019 Discharge date: 06/18/2019  Admitted From: home  Disposition:  SNF   Recommendations for Outpatient Follow-up:  1. Please check Hb in 1 month to follow for improvement   Discharge Condition:  stable   CODE STATUS:  Full code   Diet recommendation:  regular Consultations:  Orthopedic surgery    Discharge Diagnoses:  Principal Problem:   Closed fracture of distal end of right femur, initial encounter ([place]) Active Problems:   Acute blood loss anemia   Primary cancer of right upper lobe of lung (HCC)   Dementia ([place])   History of DVT of lower extremity   UTI     Brief Summary: Valerie [name] 73 year old female patient with PMH of metastatic non-small cell lung cancer s/p lobectomy(on Tagrisso oral chemotherapy), resection of brain tumor, DVT on Xarelto, dementia and recurrent UTIs, recently on Keflex for E. coli UTI.She initially presented [facility] ED due to acute onset of right knee pain after she slipped at home while getting out of the shower and fell without hitting her head. She was admitted for right distal femur fracture and right tibial plateau fracture. Orthopedics was consulted and advised transferring to [facility] for surgery.Xarelto was held perioperatively. Patient underwent surgical repair of the right distal femur fracture afternoon of 10/22. Postop, patient is to remain nonweightbearing with brace on at all times. At baseline patient ambulates with a rolling walker as needed and is independent in ADLs. PT/OT evaluated patient 10/23 and recommend SNF on discharge as patient is max assist x2 for bed mobility and transfers.    Hospital Course:  Principal Problem:   Closed fracture of distal end of right femur, initial encounter - status post ORIF on 10/22 by Dr. Percell Miller - plan for  SNF- Xarelto resumed for DVT prophylaxis - f/u with Dr Percell Miller in 2 wks.  Active Problems:   Acute blood loss anemia- asymptomatic - baseline Hb is 12-13 - it has dropped to about 8.7 and has remained stable at this level- no transfusion given    Primary cancer of right upper lobe of lung - metastatic non small cell  - s/p lobectomy, resection of brain meds and chemoradiation - Currently on oral chemotherapy, with Tagrisso.  - Followed by , Dr. Earlie Server.  H/o DVT - Xarelto resumed after surgery    Dementia  - mild- cont Aricept    UTI - e coli and e faecalis - completed course of Keflex that was started as outpt     Discharge Exam: Vitals:   06/18/19 0211 06/18/19 0843  BP: 120/72 125/72  Pulse: 92 100  Resp: 20 18  Temp: 98.3 F (36.8 C) 98.4 F (36.9 C)  SpO2: 97% 96%   Vitals:   06/17/19 1948 06/18/19 0209 06/18/19 0211 06/18/19 0843  BP: 118/63  120/72 125/72  Pulse: (!) 107 95 92 100  Resp: 20 20 20 18   Temp: 98.8 F (37.1 C)  98.3 F (36.8 C) 98.4 F (36.9 C)  TempSrc: Oral  Oral Oral  SpO2: 95% 96% 97% 96%  Weight:      Height:        General: Pt is alert, awake, not in acute distress Cardiovascular: RRR, S1/S2 +, no rubs, no gallops Respiratory: CTA bilaterally, no wheezing, no rhonchi Abdominal: Soft, NT, ND, bowel sounds + Extremities: no edema, no cyanosis   Discharge Instructions  Discharge Instructions    Increase activity slowly   Complete by: As directed      Allergies as of 06/18/2019   No Known Allergies     Medication List    STOP taking these medications   cephALEXin 500 MG capsule Commonly known as: KEFLEX     TAKE these medications   acetaminophen 325 MG tablet Commonly known as: TYLENOL Take 2 tablets (650 mg total) by mouth every 4 (four) hours as needed for mild pain, fever or headache.   cholecalciferol 1000 units tablet Commonly known as: VITAMIN D Take 1,000 Units by mouth every evening.   donepezil  10 MG tablet Commonly known as: ARICEPT Take 1 tablet (10 mg total) by mouth every evening.   ibuprofen 200 MG tablet Commonly known as: ADVIL Take 2 tablets (400 mg total) by mouth every 6 (six) hours as needed for fever, headache or mild pain. What changed: reasons to take this   loperamide 2 MG capsule Commonly known as: IMODIUM Take 2 mg by mouth as needed for diarrhea or loose stools.   MAGNESIUM PO Take 1 tablet by mouth daily.   methocarbamol 500 MG tablet Commonly known as: ROBAXIN Take 1 tablet (500 mg total) by mouth every 6 (six) hours as needed for muscle spasms.   Multi-Vitamins Tabs Take 1 tablet by mouth every evening.   Tagrisso 80 MG tablet Generic drug: osimertinib mesylate TAKE 1 TABLET (80MG ) BY MOUTH DAILY. What changed: See the new instructions.   vitamin B-12 1000 MCG tablet Commonly known as: CYANOCOBALAMIN Take 1,000 mcg by mouth every evening.   vitamin C 100 MG tablet Take 100 mg by mouth every evening.   Xarelto 20 MG Tabs tablet Generic drug: rivaroxaban Take 1 tablet (20 mg total) by mouth every evening.     ASK your doctor about these medications   HYDROcodone-acetaminophen 5-325 MG tablet Commonly known as: Norco Take 1-2 tablets by mouth every 6 (six) hours as needed for up to 5 days for severe pain (Use Tylenol or Ibuprofen for mild and moderate pain.). Ask about: Should I take this medication?            Durable Medical Equipment  (From admission, onward)         Start     Ordered   06/16/19 1543  For home use only DME Hospital bed  Once    Question Answer Comment  Length of Need 12 Months   Patient has (list medical condition): R distal femur fx and R tibial plateau fx   The above medical condition requires: Patient requires the ability to reposition frequently   Head must be elevated greater than: 45 degrees   Bed type Semi-electric   Hoyer Lift Yes   Support Surface: Gel Overlay      06/16/19 1542   06/12/19  1637  For home use only DME Other see comment  Once    Comments: Hoyer lift  Question:  Length of Need  Answer:  12 Months   06/12/19 1643   06/12/19 1636  For home use only DME lightweight manual wheelchair with seat cushion  Once    Comments: Patient suffers from a R distal femur fx and R tibial plateau fx which impairs their ability to perform daily activities like toileting, feeding, dressing, grooming, bathing in the home.  A cane, walker, crutch will not resolve  issue with performing activities of daily living. A wheelchair will allow patient to safely perform daily activities. Patient  is not able to propel themselves in the home using a standard weight wheelchair due to arm weakness, general weakness, endurance. Patient can self propel in the lightweight wheelchair.  Length of need: Lifetime  Accessories: elevating leg rests (ELRs), wheel locks, extensions and anti-tippers.   06/12/19 1643         Follow-up Information    Renette Butters, MD In 2 weeks.   Specialty: Orthopedic Surgery Contact information: [address] [place] [address] [phone]        [facility] Preferred SNF Follow up.   Specialty: [place] Why: rehab Contact information: [place] [phone]         No Known Allergies   Procedures/Studies:    Dg Knee 1-2 Views Right  Result Date: 06/11/2019 CLINICAL DATA:  Distal femoral and proximal tibial fractures EXAM: RIGHT KNEE - 1-2 VIEW; DG C-ARM 1-60 MIN COMPARISON:  None. FLUOROSCOPY TIME:  Fluoroscopy Time:  1 minutes 31 seconds Radiation Exposure Index (if provided by the fluoroscopic device): Not available Number of Acquired Spot Images: 6 FINDINGS: Distal femoral fracture is again identified. Fixation sideplate is subsequently placed with multiple fixation screws along the lateral aspect. Fixation sideplate along the lateral aspect of the tibia is noted  as well. Fracture fragments are in near anatomic alignment. IMPRESSION: ORIF of distal femoral and proximal tibial fractures. Electronically Signed   By: Inez Catalina M.D.   On: 06/11/2019 12:50   Dg Tibia/fibula Right  Result Date: 06/11/2019 CLINICAL DATA:  Pain after fall EXAM: RIGHT TIBIA AND FIBULA - 2 VIEW COMPARISON:  None. FINDINGS: Comminuted impacted intra-articular fracture of the intracondylar notch and medial femoral condyle. There is also comminuted mildly impacted intra-articular fracture of the lateral tibial plateau. There is diffuse osteopenia. Prepatellar subcutaneous edema. IMPRESSION: Comminuted fractures of the distal femur and lateral tibial plateau. Prepatellar subcutaneous edema. Diffuse osteopenia. Electronically Signed   By: Prudencio Pair M.D.   On: 06/11/2019 00:18   Ct Head Wo Contrast  Result Date: 06/11/2019 CLINICAL DATA:  Fall, on blood thinner EXAM: CT HEAD WITHOUT CONTRAST TECHNIQUE: Contiguous axial images were obtained from the base of the skull through the vertex without intravenous contrast. COMPARISON:  September 13, 2018 FINDINGS: The study is limited due to patient motion. Brain: No evidence of acute territorial infarction, hemorrhage, hydrocephalus,extra-axial collection or mass lesion/mass effect. There is dilatation the ventricles and sulci consistent with age-related atrophy. Low-attenuation changes in the deep white matter consistent with small vessel ischemia. Vascular: No hyperdense vessel or unexpected calcification. Skull: Postsurgical changes from a prior left occipital craniotomy are again noted. Sinuses/Orbits: The visualized paranasal sinuses and mastoid air cells are clear. The orbits and globes intact. Other: None Cervical spine: Alignment: Physiologic Skull base and vertebrae: Visualized skull base is intact. No atlanto-occipital dissociation. The vertebral body heights are well maintained. No fracture or pathologic osseous lesion seen. Soft tissues  and spinal canal: The visualized paraspinal soft tissues are unremarkable. No prevertebral soft tissue swelling is seen. The spinal canal is grossly unremarkable, no large epidural collection or significant canal narrowing. Disc levels: Mild disc height loss with uncovertebral osteophytes and disc osteophyte complex is seen most notable at C4-C5. Upper chest: The lung apices are clear. Thoracic inlet is within normal limits. Other: None IMPRESSION: No acute intracranial abnormality, somewhat limited due to patient motion. Findings consistent with age related atrophy and chronic small vessel ischemia No acute fracture or malalignment of the  spine. Electronically Signed   By: Prudencio Pair M.D.   On: 06/11/2019 00:38   Ct Cervical Spine Wo Contrast  Result Date: 06/11/2019 CLINICAL DATA:  Fall, on blood thinner EXAM: CT HEAD WITHOUT CONTRAST TECHNIQUE: Contiguous axial images were obtained from the base of the skull through the vertex without intravenous contrast. COMPARISON:  September 13, 2018 FINDINGS: The study is limited due to patient motion. Brain: No evidence of acute territorial infarction, hemorrhage, hydrocephalus,extra-axial collection or mass lesion/mass effect. There is dilatation the ventricles and sulci consistent with age-related atrophy. Low-attenuation changes in the deep white matter consistent with small vessel ischemia. Vascular: No hyperdense vessel or unexpected calcification. Skull: Postsurgical changes from a prior left occipital craniotomy are again noted. Sinuses/Orbits: The visualized paranasal sinuses and mastoid air cells are clear. The orbits and globes intact. Other: None Cervical spine: Alignment: Physiologic Skull base and vertebrae: Visualized skull base is intact. No atlanto-occipital dissociation. The vertebral body heights are well maintained. No fracture or pathologic osseous lesion seen. Soft tissues and spinal canal: The visualized paraspinal soft tissues are unremarkable.  No prevertebral soft tissue swelling is seen. The spinal canal is grossly unremarkable, no large epidural collection or significant canal narrowing. Disc levels: Mild disc height loss with uncovertebral osteophytes and disc osteophyte complex is seen most notable at C4-C5. Upper chest: The lung apices are clear. Thoracic inlet is within normal limits. Other: None IMPRESSION: No acute intracranial abnormality, somewhat limited due to patient motion. Findings consistent with age related atrophy and chronic small vessel ischemia No acute fracture or malalignment of the spine. Electronically Signed   By: Prudencio Pair M.D.   On: 06/11/2019 00:38   Ct Knee Right Wo Contrast  Result Date: 06/11/2019 CLINICAL DATA:  Complex knee fractures. EXAM: CT OF THE right KNEE WITHOUT CONTRAST TECHNIQUE: Multidetector CT imaging of the right knee was performed according to the standard protocol. Multiplanar CT image reconstructions were also generated. COMPARISON:  Right knee radiographs 06/10/2019 FINDINGS: Oblique coursing mildly displaced fracture extending from the medial supracondylar region through the condylar notch. Maximum displacement along the articular surface is approximately 6 mm. Associated die punch type fracture involving the lateral tibial plateau with approximately 5 mm of depression and mild comminution. The fracture extends out through the lateral cortex of the tibia near the metadiaphyseal junction. Associated moderate to large lipohemarthrosis is noted. Fairly significant underlying osteoporosis. The patella and fibula are intact. Grossly by CT the cruciate and collateral ligaments appear intact. The quadriceps and patellar tendons are intact. IMPRESSION: 1. Oblique coursing intra-articular medial femoral condylar fracture as detailed above. 2. Mildly depressed die punch type fracture involving the lateral tibial plateau. Electronically Signed   By: Marijo Sanes M.D.   On: 06/11/2019 06:55   Dg Chest  Port 1 View  Result Date: 06/16/2019 CLINICAL DATA:  New onset cough EXAM: PORTABLE CHEST 1 VIEW COMPARISON:  06/11/2019 FINDINGS: Elevation of the left hemidiaphragm with left base atelectasis. Right lung clear. Heart is borderline in size. No visible effusions or acute bony abnormality. IMPRESSION: Stable elevation of the left hemidiaphragm with left base atelectasis. Electronically Signed   By: Rolm Baptise M.D.   On: 06/16/2019 13:51   Dg Chest Port 1 View  Result Date: 06/11/2019 CLINICAL DATA:  Preop, lung cancer EXAM: PORTABLE CHEST 1 VIEW COMPARISON:  September 13, 2018 FINDINGS: The cardiomediastinal silhouette is unchanged from prior exam. Aortic knob calcifications. Again noted is left hilar surgical clips and postsurgical changes. Elevation of the left  hemidiaphragm. The right lung is clear. IMPRESSION: Stable postsurgical changes.  No acute cardiopulmonary process. Electronically Signed   By: Prudencio Pair M.D.   On: 06/11/2019 02:13   Dg Knee Complete 4 Views Right  Result Date: 06/11/2019 CLINICAL DATA:  Knee pain EXAM: RIGHT KNEE - COMPLETE 4+ VIEW COMPARISON:  None. FINDINGS: There is comminuted slightly impacted fracture seen through the intracondylar notch extending through the medial femoral condyle and medial metadiaphysis. There is also intra-articular mildly comminuted impacted fracture seen through the lateral tibial plateau. A small lipohemarthrosis is seen. Prepatellar subcutaneous edema is noted. IMPRESSION: 1. Comminuted impacted intra-articular fracture through the intracondylar notch extending through the medial femoral condyle and femoral shaft. 2. Comminuted mildly impacted intra-articular fracture of the lateral tibial plateau. 3. Small lipohemarthrosis Electronically Signed   By: Prudencio Pair M.D.   On: 06/11/2019 00:13   Dg Knee Right Port  Result Date: 06/11/2019 CLINICAL DATA:  Status post ORIF of distal femoral and proximal tibial fractures EXAM: PORTABLE RIGHT  KNEE - 1-2 VIEW COMPARISON:  Intraop films from earlier in the same day. FINDINGS: Lateral fixation side plates are noted along the distal femur and proximal tibia with multiple fixation screws. Fracture fragments are in near anatomic alignment. IMPRESSION: ORIF of distal femoral and proximal tibial fractures. Electronically Signed   By: Inez Catalina M.D.   On: 06/11/2019 14:49   Dg C-arm 1-60 Min  Result Date: 06/11/2019 CLINICAL DATA:  Distal femoral and proximal tibial fractures EXAM: RIGHT KNEE - 1-2 VIEW; DG C-ARM 1-60 MIN COMPARISON:  None. FLUOROSCOPY TIME:  Fluoroscopy Time:  1 minutes 31 seconds Radiation Exposure Index (if provided by the fluoroscopic device): Not available Number of Acquired Spot Images: 6 FINDINGS: Distal femoral fracture is again identified. Fixation sideplate is subsequently placed with multiple fixation screws along the lateral aspect. Fixation sideplate along the lateral aspect of the tibia is noted as well. Fracture fragments are in near anatomic alignment. IMPRESSION: ORIF of distal femoral and proximal tibial fractures. Electronically Signed   By: Inez Catalina M.D.   On: 06/11/2019 12:50   Dg Hip Unilat W Or Wo Pelvis 2-3 Views Right  Result Date: 06/11/2019 CLINICAL DATA:  Hip pain after fall EXAM: DG HIP (WITH OR WITHOUT PELVIS) 2-3V RIGHT COMPARISON:  None. FINDINGS: There is no evidence of hip fracture or dislocation. 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 IMPRESSION: No acute osseous abnormality. Electronically Signed   By: Prudencio Pair M.D.   On: 06/11/2019 00:12   Dg Femur Port, [place] 2 Views Right  Result Date: 06/11/2019 CLINICAL DATA:  Status post ORIF of right femoral fracture EXAM: RIGHT FEMUR PORTABLE 2 VIEW COMPARISON:  None. FINDINGS: Lateral fixation sideplate is noted with multiple fixation screws. Fracture fragments are in near anatomic alignment. No other focal abnormality is noted. IMPRESSION:  Status post ORIF of right femoral fracture. Electronically Signed   By: Inez Catalina M.D.   On: 06/11/2019 14:50     The results of significant diagnostics from this hospitalization (including imaging, microbiology, ancillary and laboratory) are listed below for reference.     Microbiology: Recent Results (from the past 240 hour(s))  SARS Coronavirus 2 by RT PCR (hospital order, performed in [facility] hospital lab) Nasopharyngeal Nasopharyngeal Swab     Status: None   Collection Time: 06/11/19 12:31 AM   Specimen: Nasopharyngeal Swab  Result Value Ref Range Status   SARS Coronavirus 2 NEGATIVE NEGATIVE Final  Comment: (NOTE) If result is NEGATIVE SARS-CoV-2 target nucleic acids are NOT DETECTED. The SARS-CoV-2 RNA is generally detectable in upper and lower  respiratory specimens during the acute phase of infection. The lowest  concentration of SARS-CoV-2 viral copies this assay can detect is 250  copies / mL. A negative result does not preclude SARS-CoV-2 infection  and should not be used as the sole basis for treatment or other  patient management decisions.  A negative result may occur with  improper specimen collection / handling, submission of specimen other  than nasopharyngeal swab, presence of viral mutation(s) within the  areas targeted by this assay, and inadequate number of viral copies  (<250 copies / mL). A negative result must be combined with clinical  observations, patient history, and epidemiological information. If result is POSITIVE SARS-CoV-2 target nucleic acids are DETECTED. The SARS-CoV-2 RNA is generally detectable in upper and lower  respiratory specimens dur ing the acute phase of infection.  Positive  results are indicative of active infection with SARS-CoV-2.  Clinical  correlation with patient history and other diagnostic information is  necessary to determine patient infection status.  Positive results do  not rule out bacterial infection or  co-infection with other viruses. If result is PRESUMPTIVE POSTIVE SARS-CoV-2 nucleic acids MAY BE PRESENT.   A presumptive positive result was obtained on the submitted specimen  and confirmed on repeat testing.  While 2019 novel coronavirus  (SARS-CoV-2) nucleic acids may be present in the submitted sample  additional confirmatory testing may be necessary for epidemiological  and / or clinical management purposes  to differentiate between  SARS-CoV-2 and other Sarbecovirus currently known to infect humans.  If clinically indicated additional testing with an alternate test  methodology [phone]) is advised. The SARS-CoV-2 RNA is generally  detectable in upper and lower respiratory sp ecimens during the acute  phase of infection. The expected result is Negative. Fact Sheet for Patients:  StrictlyIdeas.no Fact Sheet for Healthcare Providers: BankingDealers.co.za This test is not yet approved or cleared by the [place] FDA and has been authorized for detection and/or diagnosis of SARS-CoV-2 by FDA under an Emergency Use Authorization (EUA).  This EUA will remain in effect (meaning this test can be used) for the duration of the COVID-19 declaration under Section 564(b)(1) of the Act, 21 U.S.C. section 360bbb-3(b)(1), unless the authorization is terminated or revoked sooner. Performed at [facility], [place] [address] [place], [place] [address]   SARS CORONAVIRUS 2 (TAT 6-24 HRS) Nasopharyngeal Nasopharyngeal Swab     Status: None   Collection Time: 06/17/19  3:53 PM   Specimen: Nasopharyngeal Swab  Result Value Ref Range Status   SARS Coronavirus 2 NEGATIVE NEGATIVE Final    Comment: (NOTE) SARS-CoV-2 target nucleic acids are NOT DETECTED. The SARS-CoV-2 RNA is generally detectable in upper and lower respiratory specimens during the acute phase of infection. Negative results do not preclude SARS-CoV-2 infection, do  not rule out co-infections with other pathogens, and should not be used as the sole basis for treatment or other patient management decisions. Negative results must be combined with clinical observations, patient history, and epidemiological information. The expected result is Negative. Fact Sheet for Patients: SugarRoll.be Fact Sheet for Healthcare Providers: https://www.woods-mathews.com/ This test is not yet approved or cleared by the [place] FDA and  has been authorized for detection and/or diagnosis of SARS-CoV-2 by FDA under an Emergency Use Authorization (EUA). This EUA will remain  in effect (meaning this test can be  used) for the duration of the COVID-19 declaration under Section 56 4(b)(1) of the Act, 21 U.S.C. section 360bbb-3(b)(1), unless the authorization is terminated or revoked sooner. Performed at [facility], [place] [address], [place], [place] [address]      Labs: BNP (last 3 results) No results for input(s): BNP in the last 8760 hours. Basic Metabolic Panel: Recent Labs  Lab 06/12/19 0259 06/13/19 0632 06/14/19 0553 06/15/19 0445 06/16/19 0359  NA 137 137 137 138 136  K 3.8 3.6 3.6 4.2 4.1  CL 106 103 103 103 103  CO2 23 24 26 25 25   GLUCOSE 144* 137* 109* 113* 131*  BUN 10 12 16 23 20   CREATININE 0.71 0.81 0.76 0.76 0.75  CALCIUM 8.6* 8.8* 8.6* 8.9 8.9  MG  --  2.1  --  2.4  --    Liver Function Tests: No results for input(s): AST, ALT, ALKPHOS, BILITOT, PROT, ALBUMIN in the last 168 hours. No results for input(s): LIPASE, AMYLASE in the last 168 hours. No results for input(s): AMMONIA in the last 168 hours. CBC: Recent Labs  Lab 06/13/19 0632 06/14/19 0553 06/15/19 0445 06/15/19 1643 06/17/19 0225  WBC 7.3 6.0 6.2 6.3 10.1  NEUTROABS 6.1 4.6  --   --  8.6*  HGB 9.1* 8.8* 8.7* 8.8* 8.7*  HCT 28.6* 27.1* 27.1* 27.8* 26.5*  MCV 92.9 92.8 91.9 93.3 90.4  PLT 165 177 219 224 246   Cardiac  Enzymes: No results for input(s): CKTOTAL, CKMB, CKMBINDEX, TROPONINI in the last 168 hours. BNP: Invalid input(s): POCBNP CBG: No results for input(s): GLUCAP in the last 168 hours. D-Dimer No results for input(s): DDIMER in the last 72 hours. Hgb A1c No results for input(s): HGBA1C in the last 72 hours. Lipid Profile No results for input(s): CHOL, HDL, LDLCALC, TRIG, CHOLHDL, LDLDIRECT in the last 72 hours. Thyroid function studies No results for input(s): TSH, T4TOTAL, T3FREE, THYROIDAB in the last 72 hours.  Invalid input(s): FREET3 Anemia work up No results for input(s): VITAMINB12, FOLATE, FERRITIN, TIBC, IRON, RETICCTPCT in the last 72 hours. Urinalysis    Component Value Date/Time   COLORURINE YELLOW 09/13/2018 1503   APPEARANCEUR HAZY (A) 09/13/2018 1503   LABSPEC 1.026 09/13/2018 1503   PHURINE 5.0 09/13/2018 1503   GLUCOSEU NEGATIVE 09/13/2018 1503   HGBUR LARGE (A) 09/13/2018 1503   BILIRUBINUR negative 05/25/2019 1653   KETONESUR 20 (A) 09/13/2018 1503   PROTEINUR Negative 05/25/2019 1653   PROTEINUR 30 (A) 09/13/2018 1503   UROBILINOGEN 0.2 05/25/2019 1653   UROBILINOGEN 0.2 10/20/2014 1636   NITRITE negative 05/25/2019 1653   NITRITE NEGATIVE 09/13/2018 1503   LEUKOCYTESUR Trace (A) 05/25/2019 1653   Sepsis Labs Invalid input(s): PROCALCITONIN,  WBC,  LACTICIDVEN Microbiology Recent Results (from the past 240 hour(s))  SARS Coronavirus 2 by RT PCR (hospital order, performed in [place] hospital lab) Nasopharyngeal Nasopharyngeal Swab     Status: None   Collection Time: 06/11/19 12:31 AM   Specimen: Nasopharyngeal Swab  Result Value Ref Range Status   SARS Coronavirus 2 NEGATIVE NEGATIVE Final    Comment: (NOTE) If result is NEGATIVE SARS-CoV-2 target nucleic acids are NOT DETECTED. The SARS-CoV-2 RNA is generally detectable in upper and lower  respiratory specimens during the acute phase of infection. The lowest  concentration of SARS-CoV-2 viral  copies this assay can detect is 250  copies / mL. A negative result does not preclude SARS-CoV-2 infection  and should not be used as the  sole basis for treatment or other  patient management decisions.  A negative result may occur with  improper specimen collection / handling, submission of specimen other  than nasopharyngeal swab, presence of viral mutation(s) within the  areas targeted by this assay, and inadequate number of viral copies  (<250 copies / mL). A negative result must be combined with clinical  observations, patient history, and epidemiological information. If result is POSITIVE SARS-CoV-2 target nucleic acids are DETECTED. The SARS-CoV-2 RNA is generally detectable in upper and lower  respiratory specimens dur ing the acute phase of infection.  Positive  results are indicative of active infection with SARS-CoV-2.  Clinical  correlation with patient history and other diagnostic information is  necessary to determine patient infection status.  Positive results do  not rule out bacterial infection or co-infection with other viruses. If result is PRESUMPTIVE POSTIVE SARS-CoV-2 nucleic acids MAY BE PRESENT.   A presumptive positive result was obtained on the submitted specimen  and confirmed on repeat testing.  While 2019 novel coronavirus  (SARS-CoV-2) nucleic acids may be present in the submitted sample  additional confirmatory testing may be necessary for epidemiological  and / or clinical management purposes  to differentiate between  SARS-CoV-2 and other Sarbecovirus currently known to infect humans.  If clinically indicated additional testing with an alternate test  methodology [phone]) is advised. The SARS-CoV-2 RNA is generally  detectable in upper and lower respiratory sp ecimens during the acute  phase of infection. The expected result is Negative. Fact Sheet for Patients:  StrictlyIdeas.no Fact Sheet for Healthcare  Providers: BankingDealers.co.za This test is not yet approved or cleared by the [place] FDA and has been authorized for detection and/or diagnosis of SARS-CoV-2 by FDA under an Emergency Use Authorization (EUA).  This EUA will remain in effect (meaning this test can be used) for the duration of the COVID-19 declaration under Section 564(b)(1) of the Act, 21 U.S.C. section 360bbb-3(b)(1), unless the authorization is terminated or revoked sooner. Performed at [facility], [place] [address], [place], [place] [address]   SARS CORONAVIRUS 2 (TAT 6-24 HRS) Nasopharyngeal Nasopharyngeal Swab     Status: None   Collection Time: 06/17/19  3:53 PM   Specimen: Nasopharyngeal Swab  Result Value Ref Range Status   SARS Coronavirus 2 NEGATIVE NEGATIVE Final    Comment: (NOTE) SARS-CoV-2 target nucleic acids are NOT DETECTED. The SARS-CoV-2 RNA is generally detectable in upper and lower respiratory specimens during the acute phase of infection. Negative results do not preclude SARS-CoV-2 infection, do not rule out co-infections with other pathogens, and should not be used as the sole basis for treatment or other patient management decisions. Negative results must be combined with clinical observations, patient history, and epidemiological information. The expected result is Negative. Fact Sheet for Patients: SugarRoll.be Fact Sheet for Healthcare Providers: https://www.woods-mathews.com/ This test is not yet approved or cleared by the [place] FDA and  has been authorized for detection and/or diagnosis of SARS-CoV-2 by FDA under an Emergency Use Authorization (EUA). This EUA will remain  in effect (meaning this test can be used) for the duration of the COVID-19 declaration under Section 56 4(b)(1) of the Act, 21 U.S.C. section 360bbb-3(b)(1), unless the authorization is terminated or revoked  sooner. Performed at [facility], [place] [address], [place], [place] [address]      Time coordinating discharge in minutes: 65  SIGNED:   Debbe Odea, MD  [facility] 06/18/2019, 9:54 AM Pager  If 7PM-7AM, please contact night-coverage [email or web address] Password [email or web address]

## 2019-06-18 NOTE — Care Management Important Message (Signed)
Important Message  Patient Details  Name: Valerie [name] MRN: [id number] Date of Birth: 05/17/1946   Medicare Important Message Given:  Yes     Memory Argue 06/18/2019, 2:26 PM    IM GIVEN TO  PATIENT  BEFORE  DISCHARGE

## 2019-06-18 NOTE — TOC Transition Note (Signed)
Transition of Care [facility]) - CM/SW Discharge Note   Patient Details  Name: Valerie [name] MRN: [id number] Date of Birth: 1946-08-07  [facility]) CM/SW Contact:  Midge Minium RN, BSN, NCM-BC, ACM-RN [phone] (work remotely) Phone Number: 06/18/2019, 10:37 AM   Clinical Narrative:    Patient is medically stable to transition to [facility]. CM informed the patient/spouse that PTAR will be arranged. PTAR will be scheduled for 1130.  Please call report to: [phone]; room number 701P   Final next level of care: Skilled Nursing Facility Barriers to Discharge: No Barriers Identified   Patient Goals and CMS Choice Patient states their goals for this hospitalization and ongoing recovery are:: "to return home" CMS Medicare.gov Compare Post Acute Care list provided to:: Patient Choice offered to / list presented to : Patient  Discharge Placement              Patient chooses bed at: [facility] Patient to be transferred to facility by: [place] Name of family member notified: patient/Maurice Patient and family notified of of transfer: 06/18/19  Discharge Plan and Services In-house Referral: NA Discharge Planning Services: CM Consult Post Acute Care Choice: [place]          DME Arranged: N/A DME Agency: NA HH Arranged: NA HH Agency: NA  Social Determinants of Health (SDOH) Interventions     Readmission Risk Interventions No flowsheet data found.

## 2019-06-19 ENCOUNTER — Ambulatory Visit (HOSPITAL_COMMUNITY): Admission: RE | Admit: 2019-06-19 | Payer: Medicare Other | Source: Ambulatory Visit

## 2019-06-24 ENCOUNTER — Inpatient Hospital Stay: Payer: Medicare Other | Attending: Internal Medicine

## 2019-06-25 MED FILL — TAGRISSO 80 MG TABLET: 80 | 30 days supply | Qty: 30 | Fill #2

## 2019-06-30 ENCOUNTER — Inpatient Hospital Stay: Payer: Medicare Other | Admitting: Internal Medicine

## 2019-07-07 NOTE — Telephone Encounter (Signed)
Please call pt to let her know she needs pneumonia vaccine. Ask her husband how she is doing.

## 2019-07-08 NOTE — Telephone Encounter (Signed)
When is the last day?

## 2019-07-08 NOTE — Telephone Encounter (Signed)
Dr. Amadeo Garnet said that the Valerie [name] is at [facility] and she can't do anything for her self, she can't get out of the bed and that she needs around the clock care.  The Valerie [name]'s last day is supposed to be at [facility] and that he has appealed the  discission with the insurance company and that [employer] might contact the office because of the appeal.

## 2019-07-21 ENCOUNTER — Telehealth: Payer: Self-pay

## 2019-07-21 NOTE — Telephone Encounter (Signed)
Per Dr. Baird Cancer call and ask pt husband when pt was discharged?  Per pt husband she was discharged 07/12/2019 she is doing ok

## 2019-07-23 ENCOUNTER — Telehealth: Payer: Self-pay

## 2019-07-23 NOTE — Telephone Encounter (Signed)
Called Occupational therapist to let him know Dr. Baird Cancer gave a verbal consent for Occupational therapy and bedside commode

## 2019-07-28 ENCOUNTER — Telehealth: Payer: Self-pay

## 2019-07-28 NOTE — Telephone Encounter (Signed)
I left a message for the pt to call the office back.  The pt called and left a message that she fell and broker her leg, that she is pretty sure that she has a uti and that she is unable to give a urine sample.  Dr. Baird Cancer wants the pt to know that her urine will need to be checked because the pt didn't have a uti the last time she said she had one and that Dr sanders wanted me to speak to the pt's husband Dr. Amadeo Garnet to see if he is able to get a urine sample.

## 2019-07-29 ENCOUNTER — Telehealth: Payer: Self-pay

## 2019-07-29 NOTE — Telephone Encounter (Signed)
Please let him kn ow I would not treat with Mybretriq or Vesicare while she has an active UTI. I will send abx to the pharmacy

## 2019-07-29 NOTE — Telephone Encounter (Signed)
Dr. Amadeo Garnet called and said that he is unable to get a urine specimen form the pt unless she gets a catheter and that the pt has frequent urination with pain and that today she has blood in her urine because it's in the diaper that she has to wear, he has to constantly change her diaper because she is urinating so much and that he gave her some azo for the pain.  He wants to know if the pt can have a medication for a uti and an rx of myrbetriq or vesicare.

## 2019-07-31 ENCOUNTER — Telehealth: Payer: Self-pay

## 2019-07-31 NOTE — Telephone Encounter (Signed)
The pt and her husband  Dr. Amadeo Garnet was notified that Dr. Baird Cancer said that she wouldn't treat the pt with Vesicare or Myrbetriq while the pt has an active UTI and that Dr. Baird Cancer sent an antibiotic to the pharmacy for the pt.

## 2019-08-05 ENCOUNTER — Other Ambulatory Visit: Payer: Self-pay | Admitting: Internal Medicine

## 2019-08-05 DIAGNOSIS — C3411 Malignant neoplasm of upper lobe, right bronchus or lung: Secondary | ICD-10-CM

## 2019-08-10 MED FILL — TAGRISSO 80 MG TABLET: 80 | 30 days supply | Qty: 30 | Fill #0

## 2019-08-17 ENCOUNTER — Other Ambulatory Visit: Payer: Self-pay | Admitting: Internal Medicine

## 2019-08-17 DIAGNOSIS — Z86718 Personal history of other venous thrombosis and embolism: Secondary | ICD-10-CM

## 2019-08-31 ENCOUNTER — Telehealth: Payer: Self-pay

## 2019-08-31 DIAGNOSIS — Z7901 Long term (current) use of anticoagulants: Secondary | ICD-10-CM | POA: Diagnosis not present

## 2019-08-31 DIAGNOSIS — F0390 Unspecified dementia without behavioral disturbance: Secondary | ICD-10-CM | POA: Diagnosis not present

## 2019-08-31 DIAGNOSIS — S82141D Displaced bicondylar fracture of right tibia, subsequent encounter for closed fracture with routine healing: Secondary | ICD-10-CM | POA: Diagnosis not present

## 2019-08-31 DIAGNOSIS — C3411 Malignant neoplasm of upper lobe, right bronchus or lung: Secondary | ICD-10-CM | POA: Diagnosis not present

## 2019-08-31 DIAGNOSIS — M810 Age-related osteoporosis without current pathological fracture: Secondary | ICD-10-CM | POA: Diagnosis not present

## 2019-08-31 DIAGNOSIS — S90521D Blister (nonthermal), right ankle, subsequent encounter: Secondary | ICD-10-CM | POA: Diagnosis not present

## 2019-08-31 DIAGNOSIS — S72351D Displaced comminuted fracture of shaft of right femur, subsequent encounter for closed fracture with routine healing: Secondary | ICD-10-CM | POA: Diagnosis not present

## 2019-08-31 DIAGNOSIS — C7931 Secondary malignant neoplasm of brain: Secondary | ICD-10-CM | POA: Diagnosis not present

## 2019-08-31 DIAGNOSIS — Z79899 Other long term (current) drug therapy: Secondary | ICD-10-CM | POA: Diagnosis not present

## 2019-08-31 NOTE — Telephone Encounter (Signed)
I returned a call to Ephraim a physical therapist with [facility] and gave the ok from Dr. Baird Cancer for a verbal order to add nursing for assessment for a wound on the pt's right ankle.

## 2019-09-01 DIAGNOSIS — C3411 Malignant neoplasm of upper lobe, right bronchus or lung: Secondary | ICD-10-CM | POA: Diagnosis not present

## 2019-09-01 DIAGNOSIS — F0390 Unspecified dementia without behavioral disturbance: Secondary | ICD-10-CM | POA: Diagnosis not present

## 2019-09-01 DIAGNOSIS — S90521D Blister (nonthermal), right ankle, subsequent encounter: Secondary | ICD-10-CM | POA: Diagnosis not present

## 2019-09-01 DIAGNOSIS — S82141D Displaced bicondylar fracture of right tibia, subsequent encounter for closed fracture with routine healing: Secondary | ICD-10-CM | POA: Diagnosis not present

## 2019-09-01 DIAGNOSIS — Z79899 Other long term (current) drug therapy: Secondary | ICD-10-CM | POA: Diagnosis not present

## 2019-09-01 DIAGNOSIS — S72351D Displaced comminuted fracture of shaft of right femur, subsequent encounter for closed fracture with routine healing: Secondary | ICD-10-CM | POA: Diagnosis not present

## 2019-09-01 DIAGNOSIS — C7931 Secondary malignant neoplasm of brain: Secondary | ICD-10-CM | POA: Diagnosis not present

## 2019-09-01 DIAGNOSIS — Z7901 Long term (current) use of anticoagulants: Secondary | ICD-10-CM | POA: Diagnosis not present

## 2019-09-01 DIAGNOSIS — M810 Age-related osteoporosis without current pathological fracture: Secondary | ICD-10-CM | POA: Diagnosis not present

## 2019-09-04 DIAGNOSIS — S72351D Displaced comminuted fracture of shaft of right femur, subsequent encounter for closed fracture with routine healing: Secondary | ICD-10-CM | POA: Diagnosis not present

## 2019-09-04 DIAGNOSIS — M810 Age-related osteoporosis without current pathological fracture: Secondary | ICD-10-CM | POA: Diagnosis not present

## 2019-09-04 DIAGNOSIS — C7931 Secondary malignant neoplasm of brain: Secondary | ICD-10-CM | POA: Diagnosis not present

## 2019-09-04 DIAGNOSIS — S82141D Displaced bicondylar fracture of right tibia, subsequent encounter for closed fracture with routine healing: Secondary | ICD-10-CM | POA: Diagnosis not present

## 2019-09-04 DIAGNOSIS — F0390 Unspecified dementia without behavioral disturbance: Secondary | ICD-10-CM | POA: Diagnosis not present

## 2019-09-04 DIAGNOSIS — Z79899 Other long term (current) drug therapy: Secondary | ICD-10-CM | POA: Diagnosis not present

## 2019-09-04 DIAGNOSIS — Z7901 Long term (current) use of anticoagulants: Secondary | ICD-10-CM | POA: Diagnosis not present

## 2019-09-04 DIAGNOSIS — S90521D Blister (nonthermal), right ankle, subsequent encounter: Secondary | ICD-10-CM | POA: Diagnosis not present

## 2019-09-04 DIAGNOSIS — C3411 Malignant neoplasm of upper lobe, right bronchus or lung: Secondary | ICD-10-CM | POA: Diagnosis not present

## 2019-09-08 DIAGNOSIS — F0390 Unspecified dementia without behavioral disturbance: Secondary | ICD-10-CM | POA: Diagnosis not present

## 2019-09-08 DIAGNOSIS — S72351D Displaced comminuted fracture of shaft of right femur, subsequent encounter for closed fracture with routine healing: Secondary | ICD-10-CM | POA: Diagnosis not present

## 2019-09-08 DIAGNOSIS — S82141D Displaced bicondylar fracture of right tibia, subsequent encounter for closed fracture with routine healing: Secondary | ICD-10-CM | POA: Diagnosis not present

## 2019-09-08 DIAGNOSIS — C7931 Secondary malignant neoplasm of brain: Secondary | ICD-10-CM | POA: Diagnosis not present

## 2019-09-08 DIAGNOSIS — C3411 Malignant neoplasm of upper lobe, right bronchus or lung: Secondary | ICD-10-CM | POA: Diagnosis not present

## 2019-09-08 DIAGNOSIS — M810 Age-related osteoporosis without current pathological fracture: Secondary | ICD-10-CM | POA: Diagnosis not present

## 2019-09-08 DIAGNOSIS — Z7901 Long term (current) use of anticoagulants: Secondary | ICD-10-CM | POA: Diagnosis not present

## 2019-09-08 DIAGNOSIS — S90521D Blister (nonthermal), right ankle, subsequent encounter: Secondary | ICD-10-CM | POA: Diagnosis not present

## 2019-09-08 DIAGNOSIS — Z79899 Other long term (current) drug therapy: Secondary | ICD-10-CM | POA: Diagnosis not present

## 2019-09-10 ENCOUNTER — Inpatient Hospital Stay (HOSPITAL_COMMUNITY)
Admission: EM | Admit: 2019-09-10 | Discharge: 2019-09-18 | DRG: 690 | Disposition: A | Discharge status: Skilled Nursing Facility | Payer: Medicare PPO | Attending: Internal Medicine | Admitting: Internal Medicine

## 2019-09-10 ENCOUNTER — Encounter (HOSPITAL_COMMUNITY): Payer: Self-pay

## 2019-09-10 ENCOUNTER — Other Ambulatory Visit: Payer: Self-pay

## 2019-09-10 ENCOUNTER — Emergency Department (HOSPITAL_COMMUNITY): Payer: Medicare PPO

## 2019-09-10 DIAGNOSIS — R109 Unspecified abdominal pain: Secondary | ICD-10-CM | POA: Diagnosis not present

## 2019-09-10 DIAGNOSIS — Z9221 Personal history of antineoplastic chemotherapy: Secondary | ICD-10-CM

## 2019-09-10 DIAGNOSIS — N12 Tubulo-interstitial nephritis, not specified as acute or chronic: Secondary | ICD-10-CM | POA: Diagnosis present

## 2019-09-10 DIAGNOSIS — Z7401 Bed confinement status: Secondary | ICD-10-CM | POA: Diagnosis not present

## 2019-09-10 DIAGNOSIS — Z808 Family history of malignant neoplasm of other organs or systems: Secondary | ICD-10-CM

## 2019-09-10 DIAGNOSIS — N10 Acute tubulo-interstitial nephritis: Secondary | ICD-10-CM | POA: Diagnosis not present

## 2019-09-10 DIAGNOSIS — B9620 Unspecified Escherichia coli [E. coli] as the cause of diseases classified elsewhere: Secondary | ICD-10-CM | POA: Diagnosis not present

## 2019-09-10 DIAGNOSIS — Z9181 History of falling: Secondary | ICD-10-CM | POA: Diagnosis not present

## 2019-09-10 DIAGNOSIS — R5381 Other malaise: Secondary | ICD-10-CM | POA: Diagnosis not present

## 2019-09-10 DIAGNOSIS — Z8619 Personal history of other infectious and parasitic diseases: Secondary | ICD-10-CM

## 2019-09-10 DIAGNOSIS — F419 Anxiety disorder, unspecified: Secondary | ICD-10-CM | POA: Diagnosis present

## 2019-09-10 DIAGNOSIS — R339 Retention of urine, unspecified: Secondary | ICD-10-CM | POA: Diagnosis not present

## 2019-09-10 DIAGNOSIS — C3411 Malignant neoplasm of upper lobe, right bronchus or lung: Secondary | ICD-10-CM | POA: Diagnosis not present

## 2019-09-10 DIAGNOSIS — E860 Dehydration: Secondary | ICD-10-CM | POA: Diagnosis present

## 2019-09-10 DIAGNOSIS — Z1612 Extended spectrum beta lactamase (ESBL) resistance: Secondary | ICD-10-CM | POA: Diagnosis present

## 2019-09-10 DIAGNOSIS — Z7901 Long term (current) use of anticoagulants: Secondary | ICD-10-CM

## 2019-09-10 DIAGNOSIS — Z923 Personal history of irradiation: Secondary | ICD-10-CM

## 2019-09-10 DIAGNOSIS — K5900 Constipation, unspecified: Secondary | ICD-10-CM

## 2019-09-10 DIAGNOSIS — Z87440 Personal history of urinary (tract) infections: Secondary | ICD-10-CM | POA: Diagnosis not present

## 2019-09-10 DIAGNOSIS — Z791 Long term (current) use of non-steroidal anti-inflammatories (NSAID): Secondary | ICD-10-CM | POA: Diagnosis not present

## 2019-09-10 DIAGNOSIS — E876 Hypokalemia: Secondary | ICD-10-CM | POA: Diagnosis present

## 2019-09-10 DIAGNOSIS — Z86718 Personal history of other venous thrombosis and embolism: Secondary | ICD-10-CM

## 2019-09-10 DIAGNOSIS — Z85850 Personal history of malignant neoplasm of thyroid: Secondary | ICD-10-CM | POA: Diagnosis not present

## 2019-09-10 DIAGNOSIS — Z03818 Encounter for observation for suspected exposure to other biological agents ruled out: Secondary | ICD-10-CM | POA: Diagnosis not present

## 2019-09-10 DIAGNOSIS — M810 Age-related osteoporosis without current pathological fracture: Secondary | ICD-10-CM | POA: Diagnosis present

## 2019-09-10 DIAGNOSIS — I10 Essential (primary) hypertension: Secondary | ICD-10-CM | POA: Diagnosis not present

## 2019-09-10 DIAGNOSIS — K5641 Fecal impaction: Secondary | ICD-10-CM | POA: Diagnosis present

## 2019-09-10 DIAGNOSIS — Z902 Acquired absence of lung [part of]: Secondary | ICD-10-CM | POA: Diagnosis not present

## 2019-09-10 DIAGNOSIS — Z79899 Other long term (current) drug therapy: Secondary | ICD-10-CM

## 2019-09-10 DIAGNOSIS — Z8249 Family history of ischemic heart disease and other diseases of the circulatory system: Secondary | ICD-10-CM | POA: Diagnosis not present

## 2019-09-10 DIAGNOSIS — I469 Cardiac arrest, cause unspecified: Secondary | ICD-10-CM | POA: Diagnosis not present

## 2019-09-10 DIAGNOSIS — N390 Urinary tract infection, site not specified: Secondary | ICD-10-CM | POA: Diagnosis not present

## 2019-09-10 DIAGNOSIS — C7931 Secondary malignant neoplasm of brain: Secondary | ICD-10-CM | POA: Diagnosis present

## 2019-09-10 DIAGNOSIS — Z5111 Encounter for antineoplastic chemotherapy: Secondary | ICD-10-CM | POA: Diagnosis not present

## 2019-09-10 DIAGNOSIS — N200 Calculus of kidney: Secondary | ICD-10-CM | POA: Diagnosis not present

## 2019-09-10 DIAGNOSIS — N179 Acute kidney failure, unspecified: Secondary | ICD-10-CM | POA: Diagnosis not present

## 2019-09-10 DIAGNOSIS — Z20822 Contact with and (suspected) exposure to covid-19: Secondary | ICD-10-CM | POA: Diagnosis present

## 2019-09-10 DIAGNOSIS — R54 Age-related physical debility: Secondary | ICD-10-CM | POA: Diagnosis not present

## 2019-09-10 DIAGNOSIS — D630 Anemia in neoplastic disease: Secondary | ICD-10-CM | POA: Diagnosis present

## 2019-09-10 DIAGNOSIS — M2550 Pain in unspecified joint: Secondary | ICD-10-CM | POA: Diagnosis not present

## 2019-09-10 DIAGNOSIS — C3491 Malignant neoplasm of unspecified part of right bronchus or lung: Secondary | ICD-10-CM | POA: Diagnosis not present

## 2019-09-10 DIAGNOSIS — F0390 Unspecified dementia without behavioral disturbance: Secondary | ICD-10-CM | POA: Diagnosis not present

## 2019-09-10 DIAGNOSIS — D649 Anemia, unspecified: Secondary | ICD-10-CM | POA: Diagnosis not present

## 2019-09-10 LAB — URINALYSIS, ROUTINE W REFLEX MICROSCOPIC
Bilirubin Urine: NEGATIVE
Glucose, UA: NEGATIVE mg/dL
Ketones, ur: NEGATIVE mg/dL
Nitrite: NEGATIVE
Protein, ur: >=300 mg/dL — AB
RBC / HPF: >50 RBC/hpf — ABNORMAL HIGH (ref 0–5)
Specific Gravity, Urine: 1.011 (ref 1.005–1.030)
WBC, UA: >50 WBC/hpf — ABNORMAL HIGH (ref 0–5)
pH: 6 (ref 5.0–8.0)

## 2019-09-10 LAB — COMPREHENSIVE METABOLIC PANEL
ALT: 8 U/L (ref 0–44)
AST: 10 U/L — ABNORMAL LOW (ref 15–41)
Albumin: 3.4 g/dL — ABNORMAL LOW (ref 3.5–5.0)
Alkaline Phosphatase: 65 U/L (ref 38–126)
Anion gap: 11 (ref 5–15)
BUN: 41 mg/dL — ABNORMAL HIGH (ref 8–23)
CO2: 27 mmol/L (ref 22–32)
Calcium: 9.9 mg/dL (ref 8.9–10.3)
Chloride: 104 mmol/L (ref 98–111)
Creatinine, Ser: 1.84 mg/dL — ABNORMAL HIGH (ref 0.44–1.00)
GFR calc Af Amer: 31 mL/min — ABNORMAL LOW (ref >60)
GFR calc non Af Amer: 27 mL/min — ABNORMAL LOW (ref >60)
Glucose, Bld: 110 mg/dL — ABNORMAL HIGH (ref 70–99)
Potassium: 3.3 mmol/L — ABNORMAL LOW (ref 3.5–5.1)
Sodium: 142 mmol/L (ref 135–145)
Total Bilirubin: 0.6 mg/dL (ref 0.3–1.2)
Total Protein: 6.8 g/dL (ref 6.5–8.1)

## 2019-09-10 LAB — CBC WITH DIFFERENTIAL/PLATELET
Abs Immature Granulocytes: 0.04 10*3/uL (ref 0.00–0.07)
Basophils Absolute: 0 10*3/uL (ref 0.0–0.1)
Basophils Relative: 1 %
Eosinophils Absolute: 0.1 10*3/uL (ref 0.0–0.5)
Eosinophils Relative: 2 %
HCT: 34.7 % — ABNORMAL LOW (ref 36.0–46.0)
Hemoglobin: 10.3 g/dL — ABNORMAL LOW (ref 12.0–15.0)
Immature Granulocytes: 1 %
Lymphocytes Relative: 13 %
Lymphs Abs: 0.7 10*3/uL (ref 0.7–4.0)
MCH: 27.5 pg (ref 26.0–34.0)
MCHC: 29.7 g/dL — ABNORMAL LOW (ref 30.0–36.0)
MCV: 92.8 fL (ref 80.0–100.0)
Monocytes Absolute: 0.6 10*3/uL (ref 0.1–1.0)
Monocytes Relative: 12 %
Neutro Abs: 3.8 10*3/uL (ref 1.7–7.7)
Neutrophils Relative %: 71 %
Platelets: 284 10*3/uL (ref 150–400)
RBC: 3.74 MIL/uL — ABNORMAL LOW (ref 3.87–5.11)
RDW: 14.6 % (ref 11.5–15.5)
WBC: 5.3 10*3/uL (ref 4.0–10.5)
nRBC: 0 % (ref 0.0–0.2)

## 2019-09-10 LAB — SARS CORONAVIRUS 2 (TAT 6-24 HRS): SARS Coronavirus 2: NEGATIVE

## 2019-09-10 MED ORDER — ASCORBIC ACID 500 MG PO TABS
250.0000 mg | ORAL_TABLET | Freq: Every day | ORAL | Status: DC
  Administered 2019-09-10 – 2019-09-17 (×8): 250 mg via ORAL
  Filled 2019-09-10 (×8): qty 1

## 2019-09-10 MED ORDER — SODIUM CHLORIDE 0.9 % IV SOLN: 1.0000 g | Freq: Once | INTRAVENOUS | Status: DC

## 2019-09-10 MED ORDER — ACETAMINOPHEN 325 MG PO TABS: 650.0000 mg | ORAL_TABLET | Freq: Four times a day (QID) | ORAL | Status: DC | PRN

## 2019-09-10 MED ORDER — SODIUM CHLORIDE 0.9 % IV BOLUS
1000.0000 mL | Freq: Once | INTRAVENOUS | Status: AC
  Administered 2019-09-10: 1000 mL via INTRAVENOUS

## 2019-09-10 MED ORDER — OSIMERTINIB MESYLATE 80 MG PO TABS
80.0000 mg | ORAL_TABLET | Freq: Every day | ORAL | Status: DC
  Administered 2019-09-10: 80 mg via ORAL

## 2019-09-10 MED ORDER — SODIUM CHLORIDE 0.9 % IV SOLN
1.0000 g | INTRAVENOUS | Status: DC
  Administered 2019-09-10 – 2019-09-12 (×3): 1 g via INTRAVENOUS
  Filled 2019-09-10: qty 10
  Filled 2019-09-10: qty 1
  Filled 2019-09-10: qty 10
  Filled 2019-09-10 (×2): qty 1

## 2019-09-10 MED ORDER — ADULT MULTIVITAMIN W/MINERALS CH
1.0000 | ORAL_TABLET | Freq: Every day | ORAL | Status: DC
  Administered 2019-09-10 – 2019-09-17 (×8): 1 via ORAL
  Filled 2019-09-10 (×9): qty 1

## 2019-09-10 MED ORDER — RIVAROXABAN 20 MG PO TABS
20.0000 mg | ORAL_TABLET | Freq: Every evening | ORAL | Status: DC
  Administered 2019-09-10 – 2019-09-17 (×8): 20 mg via ORAL
  Filled 2019-09-10 (×10): qty 1

## 2019-09-10 MED ORDER — TAMSULOSIN HCL 0.4 MG PO CAPS
0.4000 mg | ORAL_CAPSULE | Freq: Every day | ORAL | Status: DC
  Administered 2019-09-10 – 2019-09-17 (×8): 0.4 mg via ORAL
  Filled 2019-09-10 (×8): qty 1

## 2019-09-10 MED ORDER — VITAMIN D 25 MCG (1000 UNIT) PO TABS
1000.0000 [IU] | ORAL_TABLET | Freq: Every evening | ORAL | Status: DC
  Administered 2019-09-10 – 2019-09-17 (×8): 1000 [IU] via ORAL
  Filled 2019-09-10 (×9): qty 1

## 2019-09-10 MED ORDER — ADULT MULTIVITAMIN W/MINERALS CH
1.0000 | ORAL_TABLET | Freq: Every evening | ORAL | Status: DC
  Filled 2019-09-10: qty 1

## 2019-09-10 MED ORDER — VITAMIN C 100 MG PO TABS: 100.0000 mg | ORAL_TABLET | Freq: Every evening | ORAL | Status: DC

## 2019-09-10 MED ORDER — DONEPEZIL HCL 10 MG PO TABS
10.0000 mg | ORAL_TABLET | Freq: Every evening | ORAL | Status: DC
  Administered 2019-09-10 – 2019-09-17 (×8): 10 mg via ORAL
  Filled 2019-09-10 (×8): qty 1

## 2019-09-10 MED ORDER — POTASSIUM CHLORIDE IN NACL 40-0.9 MEQ/L-% IV SOLN
INTRAVENOUS | Status: DC
  Administered 2019-09-10 – 2019-09-11 (×3): 125 mL/h via INTRAVENOUS
  Filled 2019-09-10 (×4): qty 1000

## 2019-09-10 MED ORDER — FLEET ENEMA 7-19 GM/118ML RE ENEM
1.0000 | ENEMA | Freq: Once | RECTAL | Status: AC
  Administered 2019-09-10: 1 via RECTAL
  Filled 2019-09-10: qty 1

## 2019-09-10 MED ORDER — HYDROCODONE-ACETAMINOPHEN 5-325 MG PO TABS
1.0000 | ORAL_TABLET | Freq: Four times a day (QID) | ORAL | Status: DC | PRN
  Administered 2019-09-10: 2 via ORAL
  Administered 2019-09-11 (×2): 1 via ORAL
  Administered 2019-09-11: 2 via ORAL
  Administered 2019-09-12 – 2019-09-18 (×4): 1 via ORAL
  Filled 2019-09-10 (×6): qty 1
  Filled 2019-09-10 (×2): qty 2
  Filled 2019-09-10: qty 1

## 2019-09-10 NOTE — ED Provider Notes (Signed)
[facility] DEPT Provider Note   CSN: [id number] Arrival date & time: 09/10/19  1104     History Chief Complaint  Patient presents with  . Rectal Pain    Valerie [name] is a 74 y.o. female.  Presents to the emergency department with chief complaint of pain in her bottom, states she has been having occasional loose stools, pain is worse whenever she has a bowel movement, no generalized abdominal pain.  Has had some difficulty in urinating, no pain in urination.  No noted blood in urination.  No fevers or chills, no vomiting or nausea.  HPI     Past Medical History:  Diagnosis Date  . Abnormal Pap smear 2006  . Anal fistula   . Ankle fracture   . Anxiety   . Arthritis   . Atrophic vaginitis 2008  . Cataract   . Dementia ([place]) 2009  . Dyspareunia 2008  . H/O osteoporosis   . H/O varicella   . H/O vitamin D deficiency   . Headache 07/24/2016  . Heart murmur   . History of measles, mumps, or rubella   . History of radiation therapy 07/28/13- 08/10/13   right lung metastasis 5000 cGy 10 sessions  . Hypertension   . Lung cancer ([place]) dx'd 2002  . Lung cancer ([place])   . Lung cancer ([place])   . Lung metastasis ([place])    PET scan 05/05/13, RUL lung nodule  . 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 Nodule of right lung CT- 06/03/12   RIGHT UPPER LOBE  . Nodule of right lung 06/03/12   Upper Lobe  . On antineoplastic chemotherapy    TARCEVA  . Osteoporosis 2010  . Primary cancer of right upper lobe of lung ([place]) 04/22/2009   Qualifier: Diagnosis of  By: Nils Pyle CMA ([place]), Mearl Latin    . Shortness of breath    hx lung ca   . Status post chemotherapy 2003   CARBOPLATIN/PACLITAXEL /STATUS POST CLINICAL TRAIL OF CELEBREX AND IRESSA AT [facility] FOR 1 YEAR  . Status post radiation therapy 2003   LEFT LUNG  . Status post radiation therapy 11/07/2005   WHOLE BRAIN: DR Larkin [facility]  . Status post radiation  therapy 06/02/2008   GAMMA KNIFE OF RESECTED CAVITAY  . Thyroid adenoma    ?  [name] Thyroid cancer ([place]) 10/18/11 bx   adenoid nodules   . Yeast infection     Patient Active Problem List   Diagnosis Date Noted  . Acute blood loss anemia 06/12/2019  . Closed fracture of distal end of right femur, initial encounter ([place]) 06/11/2019  . Closed fracture of lateral portion of right tibial plateau 06/11/2019  . History of DVT of lower extremity 06/11/2019  . Blurry vision 01/06/2019  . Headache 07/24/2016  . Cough 08/24/2015  . Chronic diarrhea 08/24/2015  . Sepsis ([place])   . Encounter for antineoplastic chemotherapy 01/31/2015  . Urinary retention 10/20/2014  . Ileus ([place]) 10/20/2014  . Cellulitis of leg, right 10/20/2014  . Fracture of right proximal fibula   . Ankle fracture 10/02/2014  . Ankle fracture 10/02/2014  . Lung cancer ([place]) 10/02/2014  . Closed fibular fracture 10/02/2014  . Dementia ([place]) 10/02/2014  . Lower urinary tract infectious disease 10/02/2014  . Reactive airway disease 10/02/2014  . Fall 10/02/2014  . Closed left ankle fracture 10/01/2014  . Right fibular fracture 10/01/2014  . Lung metastasis ([place])   .  LESION, ANUS 04/26/2009  . DIARRHEA 04/26/2009  . Primary cancer of right upper lobe of lung ([place]) 04/22/2009  . Malignant neoplasm of brain ([place]) 04/22/2009  . VITAMIN D DEFICIENCY 04/22/2009  . Dementia ([place]) 04/22/2009  . ANAL FISTULA 04/22/2009  . OSTEOPOROSIS 04/22/2009    Past Surgical History:  Procedure Laterality Date  . BRAIN SURGERY    . LEFT LOWER LOBECTOMY  05/2001  . LEFT OCCIPITAL CRANIOTOMY  09/21/2005   tumor  . LUNG LOBECTOMY    . NECK SURGERY    . ORIF ANKLE FRACTURE Left 10/02/2014   Procedure: OPEN REDUCTION INTERNAL FIXATION (ORIF) ANKLE FRACTURE;  Surgeon: Alta Corning, MD;  Location: [facility];  Service: Orthopedics;  Laterality: Left;  . ORIF FEMUR FRACTURE Right 06/11/2019   Procedure: OPEN REDUCTION INTERNAL FIXATION (ORIF)  DISTAL FEMUR FRACTURE;  Surgeon: Renette Butters, MD;  Location: [facility];  Service: Orthopedics;  Laterality: Right;  . ORIF TIBIA PLATEAU Right 06/11/2019   Procedure: Open Reduction Internal Fixation (Orif) Tibial Plateau;  Surgeon: Renette Butters, MD;  Location: [facility];  Service: Orthopedics;  Laterality: Right;  . RADICAL NECK DISSECTION  10/18/2011   Procedure: RADICAL NECK DISSECTION;  Surgeon: Izora Gala, MD;  Location: [facility];  Service: ENT;  Laterality: Right;  RIGHT MODIFIED NECK DISSECTION /POSSIBLE RIGHT THYROIDECTOM  . THYROIDECTOMY  10/18/2011   Procedure: THYROIDECTOMY WITH RADICAL NECK DISSECTION;  Surgeon: Izora Gala, MD;  Location: [facility];  Service: ENT;  Laterality: Right;     OB History    Gravida  2   Para  0   Term  0   Preterm  0   AB  0   Living  2     SAB  0   TAB  0   Ectopic  0   Multiple      Live Births  2           Family History  Problem Relation Age of Onset  . Gait disorder Mother   . Cancer Mother        Meningioma  . Cancer Father        Pancreatic  . Heart failure Sister   . Cancer Maternal Aunt        menigeoma    Social History   Tobacco Use  . Smoking status: Never Smoker  . Smokeless tobacco: Never Used  . Tobacco comment: smoked few years in college  Substance Use Topics  . Alcohol use: No  . Drug use: No    Home Medications Prior to Admission medications   Medication Sig Start Date End Date Taking? Authorizing Provider  acetaminophen (TYLENOL) 325 MG tablet Take 2 tablets (650 mg total) by mouth every 4 (four) hours as needed for mild pain, fever or headache. 06/18/19  Yes Debbe Odea, MD  Ascorbic Acid (VITAMIN C) 100 MG tablet Take 100 mg by mouth every evening. Gummie   Yes [provider]  cholecalciferol (VITAMIN D) 1000 UNITS tablet Take 1,000 Units by mouth every evening.    Yes [provider]  donepezil (ARICEPT) 10 MG tablet Take 1 tablet (10 mg total) by mouth every evening.  01/13/19  Yes [name], MD  ibuprofen (ADVIL) 200 MG tablet Take 2 tablets (400 mg total) by mouth every 6 (six) hours as needed for fever, headache or mild pain. 06/18/19  Yes Debbe Odea, MD  loperamide (IMODIUM) 2 MG capsule Take 2 mg by mouth as needed for diarrhea or  loose stools.    Yes [provider]  MAGNESIUM PO Take 1 tablet by mouth daily.   Yes [provider]  methocarbamol (ROBAXIN) 500 MG tablet Take 1 tablet (500 mg total) by mouth every 6 (six) hours as needed for muscle spasms. 06/18/19  Yes Debbe Odea, MD  Multiple Vitamin (MULTI-VITAMINS) TABS Take 1 tablet by mouth every evening.    Yes [provider]  TAGRISSO 80 MG tablet TAKE 1 TABLET (80MG ) BY MOUTH DAILY. Patient taking differently: TAKE 1 TABLET (80MG ) BY MOUTH DAILY. 08/05/19  Yes Curt Bears, MD  vitamin B-12 (CYANOCOBALAMIN) 1000 MCG tablet Take 1,000 mcg by mouth every evening.   Yes [provider]  XARELTO 20 MG TABS tablet TAKE 1 TABLET BY MOUTH ONCE DAILY IN THE EVENING Patient taking differently: Take 20 mg by mouth daily with supper.  08/17/19  Yes [name], MD    Allergies    Patient has no known allergies.  Review of Systems   Review of Systems  Constitutional: Negative for chills and fever.  HENT: Negative for ear pain and sore throat.   Eyes: Negative for pain and visual disturbance.  Respiratory: Negative for cough and shortness of breath.   Cardiovascular: Negative for chest pain and palpitations.  Gastrointestinal: Positive for abdominal pain. Negative for vomiting.  Genitourinary: Negative for dysuria and hematuria.  Musculoskeletal: Negative for arthralgias and back pain.  Skin: Negative for color change and rash.  Neurological: Negative for seizures and syncope.  All other systems reviewed and are negative.   Physical Exam Updated Vital Signs BP (!) 125/92   Pulse 87   Temp 98.1 F (36.7 C) (Oral)   Resp 17   SpO2 100%    Physical Exam Vitals and nursing note reviewed.  Constitutional:      General: She is not in acute distress.    Appearance: She is well-developed.  HENT:     Head: Normocephalic and atraumatic.  Eyes:     Conjunctiva/sclera: Conjunctivae normal.  Cardiovascular:     Rate and Rhythm: Normal rate and regular rhythm.     Heart sounds: No murmur.  Pulmonary:     Effort: Pulmonary effort is normal. No respiratory distress.     Breath sounds: Normal breath sounds.  Abdominal:     Palpations: Abdomen is soft.     Tenderness: There is no abdominal tenderness.  Musculoskeletal:     Cervical back: Neck supple.  Skin:    General: Skin is warm and dry.  Neurological:     General: No focal deficit present.     Mental Status: She is alert and oriented to person, place, and time.  Psychiatric:        Mood and Affect: Mood normal.     ED Results / Procedures / Treatments   Labs (all labs ordered are listed, but only abnormal results are displayed) Labs Reviewed  CBC WITH DIFFERENTIAL/PLATELET - Abnormal; Notable for the following components:      Result Value   RBC 3.74 (*)    Hemoglobin 10.3 (*)    HCT 34.7 (*)    MCHC 29.7 (*)    All other components within normal limits  COMPREHENSIVE METABOLIC PANEL - Abnormal; Notable for the following components:   Potassium 3.3 (*)    Glucose, Bld 110 (*)    BUN 41 (*)    Creatinine, Ser 1.84 (*)    Albumin 3.4 (*)    AST 10 (*)    GFR  calc non Af Amer 27 (*)    GFR calc Af Amer 31 (*)    All other components within normal limits  SARS CORONAVIRUS 2 (TAT 6-24 HRS)  URINALYSIS, ROUTINE W REFLEX MICROSCOPIC    EKG None  Radiology CT ABDOMEN PELVIS WO CONTRAST  Result Date: 09/10/2019 CLINICAL DATA:  Abdominal pain and diarrhea. History non-small cell lung cancer. EXAM: CT ABDOMEN AND PELVIS WITHOUT CONTRAST TECHNIQUE: Multidetector CT imaging of the abdomen and pelvis was performed following the standard protocol without IV  contrast. COMPARISON:  10/04/2017 FINDINGS: Lower chest: Remote postoperative changes involving the lower left hemithorax with chronic pleural thickening and left basilar atelectasis. Small persistent loculated pleural fluid. The right lung base is clear. Moderate eventration of both hemidiaphragms. The heart is normal in size. Stable vascular calcifications. Hepatobiliary: No focal hepatic lesions or intrahepatic biliary dilatation. The gallbladder is grossly normal. No common bile duct dilatation. Pancreas: No mass, inflammation or ductal dilatation. Spleen: Normal size. No focal lesions. Adrenals/Urinary Tract: The adrenal glands are unremarkable. Small left renal calculi are noted. There appears to be mild mucosal thickening of the collecting system in the left renal pelvis. A few areas of possible ureteral wall thickening or also suspected. Could not exclude Pyo ureteral nephrosis. Recommend correlation with urinalysis. No obstructing ureteral calculi identified. No worrisome renal lesions. 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 No bladder mass, calculi or asymmetric bladder wall thickening. Mild bladder distention. Stomach/Bowel: The stomach, duodenum, small bowel and colon are grossly normal without oral contrast. No acute inflammatory changes, mass lesions or obstructive findings. The terminal ileum and appendix are normal. Suspect small appendicoliths. Moderate stool is noted in the rectum with mild lower rectal wall thickening. Findings suggest fecal impaction. Vascular/Lymphatic: The aorta is normal in caliber. Minimal atheroscerlotic calcifications. No mesenteric of retroperitoneal mass or adenopathy. Small scattered lymph nodes are noted. Reproductive: Posterior uterine fibroid again noted. The ovaries appear normal. Other: No pelvic mass or adenopathy. No free pelvic fluid collections. No inguinal mass or adenopathy. No abdominal wall hernia or subcutaneous lesions.  Musculoskeletal: No significant bony findings. There are remote compression fractures of L1 and L4. There is a new compression fracture of T10 since February 2019. IMPRESSION: 1. Small left renal calculi but no obstructing ureteral calculi or bladder calculi. 2. Mild mucosal thickening of the left renal collecting system and possible areas of ureteral wall thickening. Recommend correlation with urinalysis. Possible pyonephrosis. 3. Moderate stool in the rectum and mild lower rectal wall thickening suggesting fecal impaction. 4. 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 5. New T10 compression fracture since February 2019. Remote compression fractures of L1 and L4. Electronically Signed   By: Marijo Sanes M.D.   On: 09/10/2019 14:39    Procedures Procedures (including critical care time)  Medications Ordered in ED Medications  sodium chloride 0.9 % bolus 1,000 mL (1,000 mLs Intravenous New Bag/Given 09/10/19 1423)  sodium phosphate (FLEET) 7-19 GM/118ML enema 1 enema (1 enema Rectal Given 09/10/19 1449)    ED Course  I have reviewed the triage vital signs and the nursing notes.  Pertinent labs & imaging results that were available during my care of the patient were reviewed by me and considered in my medical decision making (see chart for details).    MDM Rules/Calculators/A&P                      74 year old lady presents to ER with  discomfort around the anus.  Labs concerning for AKI.  CT scan was concerning for large volume and bladder, large stool burden.  Suspect her symptoms are related to large stool burden, constipation.  Suspect her AKI is related to urinary retention.  Foley catheter was placed, urinalysis was sent.  Provided fluids through IV.  Believe she benefit from inpatient mission at this time.   Will consult hospitalist for admission.  Final Clinical Impression(s) / ED Diagnoses Final diagnoses:  Constipation, unspecified constipation type  Urinary retention    AKI (acute kidney injury) [facility])    Rx / DC Orders ED Discharge Orders    None       Lucrezia Starch, MD 09/10/19 1659

## 2019-09-10 NOTE — ED Notes (Signed)
Pt had small BM in brief. Brief was changed and peri care performed.

## 2019-09-10 NOTE — ED Notes (Signed)
Pt transitioned to bed from home wheelchair with 2 person assistance.  Pt weight bearing.  Pt labels placed on pts wheelchair and hoyer lift pad.

## 2019-09-10 NOTE — H&P (Signed)
History and Physical    Valerie [name]  RKY:[id number]  DOB: 1945/12/27  DOA: 09/10/2019 PCP: [name], MD   Patient coming from: home  Chief Complaint: painful urination  HPI: Valerie [name] is a 74 y.o. female with medical history of of metastatic non-small cell lung cancer s/p lobectomy(on Tagrisso oral chemotherapy), resection of brain tumor, DVT on Xarelto, dementia and recurrent UTIs presents for painful urination and difficulty getting her urine out. She has not been eating/ drinking well either. She feels she is dehydrated. He has not had any fever or chills, nausea or vomiting. She has never been to a urologist.    ED Course: Foley placed- urine is blood tinged, Ceftriaxone ordered, Cr is 1.84  Review of Systems:  All other systems reviewed and apart from HPI, are negative.  Past Medical History:  Diagnosis Date  . Abnormal Pap smear 2006  . Anal fistula   . Ankle fracture   . Anxiety   . Arthritis   . Atrophic vaginitis 2008  . Cataract   . Dementia ([place]) 2009  . Dyspareunia 2008  . H/O osteoporosis   . H/O varicella   . H/O vitamin D deficiency   . Headache 07/24/2016  . Heart murmur   . History of measles, mumps, or rubella   . History of radiation therapy 07/28/13- 08/10/13   right lung metastasis 5000 cGy 10 sessions  . Hypertension   . Lung cancer ([place]) dx'd 2002  . Lung cancer ([place])   . Lung cancer ([place])   . Lung metastasis ([place])    PET scan 05/05/13, RUL lung nodule  . 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 Nodule of right lung CT- 06/03/12   RIGHT UPPER LOBE  . Nodule of right lung 06/03/12   Upper Lobe  . On antineoplastic chemotherapy    TARCEVA  . Osteoporosis 2010  . Primary cancer of right upper lobe of lung ([place]) 04/22/2009   Qualifier: Diagnosis of  By: Nils Pyle CMA ([place]), Mearl Latin    . Shortness of breath    hx lung ca   . Status post chemotherapy 2003   CARBOPLATIN/PACLITAXEL  /STATUS POST CLINICAL TRAIL OF CELEBREX AND IRESSA AT [facility] FOR 1 YEAR  . Status post radiation therapy 2003   LEFT LUNG  . Status post radiation therapy 11/07/2005   WHOLE BRAIN: DR Larkin [facility]  . Status post radiation therapy 06/02/2008   GAMMA KNIFE OF RESECTED CAVITAY  . Thyroid adenoma    ?  [name] Thyroid cancer ([place]) 10/18/11 bx   adenoid nodules   . Yeast infection     Past Surgical History:  Procedure Laterality Date  . BRAIN SURGERY    . LEFT LOWER LOBECTOMY  05/2001  . LEFT OCCIPITAL CRANIOTOMY  09/21/2005   tumor  . LUNG LOBECTOMY    . NECK SURGERY    . ORIF ANKLE FRACTURE Left 10/02/2014   Procedure: OPEN REDUCTION INTERNAL FIXATION (ORIF) ANKLE FRACTURE;  Surgeon: Alta Corning, MD;  Location: [facility];  Service: Orthopedics;  Laterality: Left;  . ORIF FEMUR FRACTURE Right 06/11/2019   Procedure: OPEN REDUCTION INTERNAL FIXATION (ORIF) DISTAL FEMUR FRACTURE;  Surgeon: Renette Butters, MD;  Location: [facility];  Service: Orthopedics;  Laterality: Right;  . ORIF TIBIA PLATEAU Right 06/11/2019   Procedure: Open Reduction Internal Fixation (Orif) Tibial Plateau;  Surgeon: Renette Butters, MD;  Location: [facility];  Service: Orthopedics;  Laterality:  Right;  [name] RADICAL NECK DISSECTION  10/18/2011   Procedure: RADICAL NECK DISSECTION;  Surgeon: Izora Gala, MD;  Location: [facility];  Service: ENT;  Laterality: Right;  RIGHT MODIFIED NECK DISSECTION /POSSIBLE RIGHT THYROIDECTOM  . THYROIDECTOMY  10/18/2011   Procedure: THYROIDECTOMY WITH RADICAL NECK DISSECTION;  Surgeon: Izora Gala, MD;  Location: [facility];  Service: ENT;  Laterality: Right;    Social History:   reports that she has never smoked. She has never used smokeless tobacco. She reports that she does not drink alcohol or use drugs.  No Known Allergies  Family History  Problem Relation Age of Onset  . Gait disorder Mother   . Cancer Mother        Meningioma  . Cancer Father        Pancreatic  . Heart failure Sister   . Cancer  Maternal Aunt        menigeoma     Prior to Admission medications   Medication Sig Start Date End Date Taking? Authorizing Provider  acetaminophen (TYLENOL) 325 MG tablet Take 2 tablets (650 mg total) by mouth every 4 (four) hours as needed for mild pain, fever or headache. 06/18/19  Yes Debbe Odea, MD  Ascorbic Acid (VITAMIN C) 100 MG tablet Take 100 mg by mouth every evening. Gummie   Yes [provider]  cholecalciferol (VITAMIN D) 1000 UNITS tablet Take 1,000 Units by mouth every evening.    Yes [provider]  donepezil (ARICEPT) 10 MG tablet Take 1 tablet (10 mg total) by mouth every evening. 01/13/19  Yes [name], MD  ibuprofen (ADVIL) 200 MG tablet Take 2 tablets (400 mg total) by mouth every 6 (six) hours as needed for fever, headache or mild pain. 06/18/19  Yes Debbe Odea, MD  loperamide (IMODIUM) 2 MG capsule Take 2 mg by mouth as needed for diarrhea or loose stools.    Yes [provider]  MAGNESIUM PO Take 1 tablet by mouth daily.   Yes [provider]  methocarbamol (ROBAXIN) 500 MG tablet Take 1 tablet (500 mg total) by mouth every 6 (six) hours as needed for muscle spasms. 06/18/19  Yes Debbe Odea, MD  Multiple Vitamin (MULTI-VITAMINS) TABS Take 1 tablet by mouth every evening.    Yes [provider]  TAGRISSO 80 MG tablet TAKE 1 TABLET (80MG ) BY MOUTH DAILY. Patient taking differently: TAKE 1 TABLET (80MG ) BY MOUTH DAILY. 08/05/19  Yes Curt Bears, MD  vitamin B-12 (CYANOCOBALAMIN) 1000 MCG tablet Take 1,000 mcg by mouth every evening.   Yes [provider]  XARELTO 20 MG TABS tablet TAKE 1 TABLET BY MOUTH ONCE DAILY IN THE EVENING Patient taking differently: Take 20 mg by mouth daily with supper.  08/17/19  Yes [name], MD    Physical Exam: Wt Readings from Last 3 Encounters:  06/10/19 78.5 kg  05/12/19 77.9 kg  03/10/19 77.2 kg   Vitals:   09/10/19 1330 09/10/19 1400 09/10/19 1500  09/10/19 1600  BP: 122/62  136/85 (!) 125/92  Pulse:  74 92 87  Resp:    17  Temp:      TempSrc:      SpO2:  100% 100% 100%      Constitutional:  Calm & comfortable Eyes: PERRLA, lids and conjunctivae normal ENT:  Mucous membranes are moist.  Pharynx clear of exudate   Normal dentition.  Neck: Supple, no masses  Respiratory:  Clear to auscultation bilaterally  Normal respiratory effort.  Cardiovascular:  S1 & S2 heard, regular rate and rhythm No Murmurs Abdomen:  Non distended No tenderness, No masses Bowel sounds normal Extremities:  No clubbing / cyanosis No pedal edema No joint deformity    Skin:  No rashes, lesions or ulcers Neurologic:  AAO x 3 CN 2-12 grossly intact Sensation intact Strength 5/5 in all 4 extremities Psychiatric:  Normal Mood and affect    Labs on Admission: I have personally reviewed following labs and imaging studies  CBC: Recent Labs  Lab 09/10/19 1155  WBC 5.3  NEUTROABS 3.8  HGB 10.3*  HCT 34.7*  MCV 92.8  PLT 564   Basic Metabolic Panel: Recent Labs  Lab 09/10/19 1155  NA 142  K 3.3*  CL 104  CO2 27  GLUCOSE 110*  BUN 41*  CREATININE 1.84*  CALCIUM 9.9   GFR: CrCl cannot be calculated (Unknown ideal weight.). Liver Function Tests: Recent Labs  Lab 09/10/19 1155  AST 10*  ALT 8  ALKPHOS 65  BILITOT 0.6  PROT 6.8  ALBUMIN 3.4*   No results for input(s): LIPASE, AMYLASE in the last 168 hours. No results for input(s): AMMONIA in the last 168 hours. Coagulation Profile: No results for input(s): INR, PROTIME in the last 168 hours. Cardiac Enzymes: No results for input(s): CKTOTAL, CKMB, CKMBINDEX, TROPONINI in the last 168 hours. BNP (last 3 results) No results for input(s): PROBNP in the last 8760 hours. HbA1C: No results for input(s): HGBA1C in the last 72 hours. CBG: No results for input(s): GLUCAP in the last 168 hours. Lipid Profile: No results for input(s): CHOL, HDL, LDLCALC, TRIG, CHOLHDL,  LDLDIRECT in the last 72 hours. Thyroid Function Tests: No results for input(s): TSH, T4TOTAL, FREET4, T3FREE, THYROIDAB in the last 72 hours. Anemia Panel: No results for input(s): VITAMINB12, FOLATE, FERRITIN, TIBC, IRON, RETICCTPCT in the last 72 hours. Urine analysis:    Component Value Date/Time   COLORURINE YELLOW 09/10/2019 1513   APPEARANCEUR TURBID (A) 09/10/2019 1513   LABSPEC 1.011 09/10/2019 1513   PHURINE 6.0 09/10/2019 1513   GLUCOSEU NEGATIVE 09/10/2019 1513   HGBUR LARGE (A) 09/10/2019 1513   BILIRUBINUR NEGATIVE 09/10/2019 1513   BILIRUBINUR negative 05/25/2019 [place] 09/10/2019 1513   PROTEINUR >=300 (A) 09/10/2019 1513   UROBILINOGEN 0.2 05/25/2019 1653   UROBILINOGEN 0.2 10/20/2014 1636   NITRITE NEGATIVE 09/10/2019 1513   LEUKOCYTESUR LARGE (A) 09/10/2019 1513   Sepsis Labs: @LABRCNTIP (procalcitonin:4,lacticidven:4) )No results found for this or any previous visit (from the past 240 hour(s)).   Radiological Exams on Admission: CT ABDOMEN PELVIS WO CONTRAST  Result Date: 09/10/2019 CLINICAL DATA:  Abdominal pain and diarrhea. History non-small cell lung cancer. EXAM: CT ABDOMEN AND PELVIS WITHOUT CONTRAST TECHNIQUE: Multidetector CT imaging of the abdomen and pelvis was performed following the standard protocol without IV contrast. COMPARISON:  10/04/2017 FINDINGS: Lower chest: Remote postoperative changes involving the lower left hemithorax with chronic pleural thickening and left basilar atelectasis. Small persistent loculated pleural fluid. The right lung base is clear. Moderate eventration of both hemidiaphragms. The heart is normal in size. Stable vascular calcifications. Hepatobiliary: No focal hepatic lesions or intrahepatic biliary dilatation. The gallbladder is grossly normal. No common bile duct dilatation. Pancreas: No mass, inflammation or ductal dilatation. Spleen: Normal size. No focal lesions. Adrenals/Urinary Tract: The adrenal  glands are unremarkable. Small left renal calculi are noted. There appears to be mild mucosal thickening of the collecting system in the left renal pelvis. A few areas of possible ureteral  wall thickening or also suspected. Could not exclude Pyo ureteral nephrosis. Recommend correlation with urinalysis. No obstructing ureteral calculi identified. No worrisome renal lesions. 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 No bladder mass, calculi or asymmetric bladder wall thickening. Mild bladder distention. Stomach/Bowel: The stomach, duodenum, small bowel and colon are grossly normal without oral contrast. No acute inflammatory changes, mass lesions or obstructive findings. The terminal ileum and appendix are normal. Suspect small appendicoliths. Moderate stool is noted in the rectum with mild lower rectal wall thickening. Findings suggest fecal impaction. Vascular/Lymphatic: The aorta is normal in caliber. Minimal atheroscerlotic calcifications. No mesenteric of retroperitoneal mass or adenopathy. Small scattered lymph nodes are noted. Reproductive: Posterior uterine fibroid again noted. The ovaries appear normal. Other: No pelvic mass or adenopathy. No free pelvic fluid collections. No inguinal mass or adenopathy. No abdominal wall hernia or subcutaneous lesions. Musculoskeletal: No significant bony findings. There are remote compression fractures of L1 and L4. There is a new compression fracture of T10 since February 2019. IMPRESSION: 1. Small left renal calculi but no obstructing ureteral calculi or bladder calculi. 2. Mild mucosal thickening of the left renal collecting system and possible areas of ureteral wall thickening. Recommend correlation with urinalysis. Possible pyonephrosis. 3. Moderate stool in the rectum and mild lower rectal wall thickening suggesting fecal impaction. 4. 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 5. New T10 compression fracture since February 2019.  Remote compression fractures of L1 and L4. Electronically Signed   By: Marijo Sanes M.D.   On: 09/10/2019 14:39      Assessment/Plan Principal Problem:   Pyelonephritis with urinary retention - h/o  frequent UTIs- bloody urine noted in foley bag - CT suggestive of pyonephritis  - cont Ceftriaxone, await culture results  - cont Foley and start Flomax- will need f/u with urology next week  Active Problems: Dehydration with AKI - Cr 1.84 - baseline Cr ~ 0.76 - started on IVF - she has had poor oral intake likely due to above infection  Hypokalemia - replace with IVF and recheck in AM    Primary cancer of right upper lobe of lung - cont Tagrisso  H/o DVT - cont Xarelto  Dementia? - very oriented in hospital- cont Aricept  Anemia - likely AOCD - follow  Bed bound - ever since admission for fracture of hip in Oct, she has been bed bound and needs a hoyer lift at home- she lives with her husband and has a caretaker as well   DVT prophylaxis: on Xarelto  Code Status: Full code  Family Communication:   Disposition Plan: return home to husband and caretaker in 2-3 days Consults called: none  Admission status: inpatient    Debbe Odea MD [facility] Pager: [email or web address] Password [email or web address] 7PM-7AM, please contact night-coverage   09/10/2019, 6:05 PM

## 2019-09-10 NOTE — ED Triage Notes (Signed)
Pt states that she is having pain in her anus. Pt states that she has been having diarrhea, and now it is painful

## 2019-09-10 NOTE — Progress Notes (Signed)
Oral Chemo Hold Policy  Med: Tagrisso  A/P: patient admitted with possible UTI placed on Rocephin per Md. Per policy, will hold Tagrisso for signs of infection  Adrian Saran, PharmD, BCPS 09/10/2019 7:02 PM

## 2019-09-11 LAB — BASIC METABOLIC PANEL
Anion gap: 9 (ref 5–15)
BUN: 30 mg/dL — ABNORMAL HIGH (ref 8–23)
CO2: 23 mmol/L (ref 22–32)
Calcium: 8.8 mg/dL — ABNORMAL LOW (ref 8.9–10.3)
Chloride: 110 mmol/L (ref 98–111)
Creatinine, Ser: 1.1 mg/dL — ABNORMAL HIGH (ref 0.44–1.00)
GFR calc Af Amer: 57 mL/min — ABNORMAL LOW (ref >60)
GFR calc non Af Amer: 49 mL/min — ABNORMAL LOW (ref >60)
Glucose, Bld: 127 mg/dL — ABNORMAL HIGH (ref 70–99)
Potassium: 3.5 mmol/L (ref 3.5–5.1)
Sodium: 142 mmol/L (ref 135–145)

## 2019-09-11 LAB — CBC
HCT: 28.6 % — ABNORMAL LOW (ref 36.0–46.0)
Hemoglobin: 8.7 g/dL — ABNORMAL LOW (ref 12.0–15.0)
MCH: 28 pg (ref 26.0–34.0)
MCHC: 30.4 g/dL (ref 30.0–36.0)
MCV: 92 fL (ref 80.0–100.0)
Platelets: 218 10*3/uL (ref 150–400)
RBC: 3.11 MIL/uL — ABNORMAL LOW (ref 3.87–5.11)
RDW: 14.8 % (ref 11.5–15.5)
WBC: 3.6 10*3/uL — ABNORMAL LOW (ref 4.0–10.5)
nRBC: 0 % (ref 0.0–0.2)

## 2019-09-11 MED ORDER — SODIUM CHLORIDE 0.9 % IV SOLN: INTRAVENOUS | Status: AC

## 2019-09-11 MED ORDER — POLYETHYLENE GLYCOL 3350 17 G PO PACK
17.0000 g | PACK | Freq: Every day | ORAL | Status: DC
  Administered 2019-09-11 – 2019-09-12 (×2): 17 g via ORAL
  Filled 2019-09-11 (×5): qty 1

## 2019-09-11 MED ORDER — CHLORHEXIDINE GLUCONATE CLOTH 2 % EX PADS
6.0000 | MEDICATED_PAD | Freq: Every day | CUTANEOUS | Status: DC
  Administered 2019-09-11 – 2019-09-17 (×7): 6 via TOPICAL

## 2019-09-11 NOTE — Evaluation (Signed)
Physical Therapy Evaluation Patient Details Name: Valerie [name] MRN: [id number] DOB: 06/13/1946 Today's Date: 09/11/2019   History of Present Illness  Pt is a 74 y/o female admitted with c/o rectal pain.  Pt with hx of falling at home 06/10/19 in which she sustained a R distal femur fx and R tibial plateau fx. Pt is now s/p ORIF R LE and per pt has been advanced to WBAT R LE. PMH includes dementia, metastatic NSCL cancer and hx of resection of a brain tumor.  Clinical Impression  Pt admitted as above and presenting with functional mobility limitations 2* generalized weakness (R LE most affected), balance deficits and ltd endurance.  Pt has been non-ambulatory since R LE fx 06/10/19 but demonstrates sufficient strength to support weight and has potential to advance mobility to become more IND (currently relies on mechanical lift to move bed to chair).  Pt states her goal is to return home to previous living arrangement.    Follow Up Recommendations Home health PT    Equipment Recommendations  None recommended by PT    Recommendations for Other Services OT consult     Precautions / Restrictions Precautions Precautions: Fall Restrictions Weight Bearing Restrictions: No      Mobility  Bed Mobility Overal bed mobility: Needs Assistance Bed Mobility: Supine to Sit;Sit to Supine     Supine to sit: Min assist Sit to supine: Min assist;+2 for physical assistance;+2 for safety/equipment   General bed mobility comments: increased time with assist for R LE and to control trunk  Transfers Overall transfer level: Needs assistance Equipment used: Rolling walker (2 wheeled) Transfers: Sit to/from Stand Sit to Stand: Mod assist;+2 physical assistance;+2 safety/equipment;From elevated surface         General transfer comment: Assist to bring wt up and fwd and to bring wt over feet in standing.  Ambulation/Gait         Gait velocity: decr   General Gait Details: Pt  requiring significant assist to balance with RW - stood x 2.  Unable to safely initiate step with either foot  Stairs            Wheelchair Mobility    Modified Rankin (Stroke Patients Only)       Balance Overall balance assessment: Needs assistance Sitting-balance support: No upper extremity supported;Feet supported Sitting balance-Leahy Scale: Fair     Standing balance support: Bilateral upper extremity supported Standing balance-Leahy Scale: Zero Standing balance comment: posterior lean                             Pertinent Vitals/Pain Pain Assessment: No/denies pain    Home Living Family/patient expects to be discharged to:: Private residence Living Arrangements: Spouse/significant other Available Help at Discharge: Family Type of Home: House Home Access: Ramped entrance     Home Layout: Able to live on main level with bedroom/bathroom Home Equipment: Walker - 4 wheels;Grab bars - tub/shower;Bedside commode;Walker - 2 wheels;Wheelchair - manual(mechanical lift)      Prior Function Level of Independence: Needs assistance   Gait / Transfers Assistance Needed: Pt stood at sink only - nonambulatory   ADL's / Homemaking Assistance Needed: Spouse and aide  Comments: Pt used mechanical lift to transfer bed to [facility] and stood at sink only.       Hand Dominance   Dominant Hand: Right    Extremity/Trunk Assessment   Upper Extremity Assessment Upper Extremity Assessment: Generalized weakness  Lower Extremity Assessment Lower Extremity Assessment: Generalized weakness;RLE deficits/detail RLE Deficits / Details: AAROM DF -10; knee 75, hip 75; strength 3-/5 with pt able to SLR     Cervical / Trunk Assessment Cervical / Trunk Assessment: Kyphotic  Communication   Communication: No difficulties  Cognition Arousal/Alertness: Awake/alert Behavior During Therapy: WFL for tasks assessed/performed Overall Cognitive Status: Within Functional Limits for  tasks assessed                                        General Comments      Exercises     Assessment/Plan    PT Assessment Patient needs continued PT services  PT Problem List Decreased strength;Decreased range of motion;Decreased activity tolerance;Decreased mobility;Decreased balance;Decreased knowledge of use of DME;Pain       PT Treatment Interventions DME instruction;Gait training;Functional mobility training;Therapeutic activities;Therapeutic exercise;Balance training;Patient/family education    PT Goals (Current goals can be found in the Care Plan section)  Acute Rehab PT Goals Patient Stated Goal: Regain IND PT Goal Formulation: With patient Time For Goal Achievement: 09/25/19 Potential to Achieve Goals: Fair    Frequency Min 3X/week   Barriers to discharge        Co-evaluation               AM-PAC PT "6 Clicks" Mobility  Outcome Measure Help needed turning from your back to your side while in a flat bed without using bedrails?: A Little Help needed moving from lying on your back to sitting on the side of a flat bed without using bedrails?: A Little Help needed moving to and from a bed to a chair (including a wheelchair)?: Total Help needed standing up from a chair using your arms (e.g., wheelchair or bedside chair)?: A Lot Help needed to walk in hospital room?: Total Help needed climbing 3-5 steps with a railing? : Total 6 Click Score: 11    End of Session Equipment Utilized During Treatment: Gait belt Activity Tolerance: Patient tolerated treatment well;Patient limited by fatigue Patient left: in bed;with call bell/phone within reach;with bed alarm set Nurse Communication: Mobility status PT Visit Diagnosis: Difficulty in walking, not elsewhere classified (R26.2);Muscle weakness (generalized) (M62.81)    Time: 4580-9983 PT Time Calculation (min) (ACUTE ONLY): 32 min   Charges:   PT Evaluation $PT Eval Moderate Complexity: 1  Mod PT Treatments $Therapeutic Activity: 8-22 mins        Debe Coder PT Acute Rehabilitation Services Pager [phone] Office [phone]   Marlene Pfluger 09/11/2019, 5:40 PM

## 2019-09-11 NOTE — Progress Notes (Addendum)
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  No report of fever or chills on presentation.  On presentation she was found to have acute kidney injury, urinary retention.  Foley was placed in the emergency department.  Started on antibiotic for urinary tract infection.  Admitted for the management of urinary tract infection, dehydration, AKI.   Assessment & Plan:   Principal Problem:   Pyelonephritis Active Problems:   Primary cancer of right upper lobe of lung (HCC)   Urinary retention   Suspected pyonephritis/urinary tract infection: Presented with dysuria.  Has history of frequent UTI.  UA was grossly suggestive of urinary tract infection. CT abdomen/pelvis showed small left renal calculi but no obstructing ureteral calculi or bladder calculi,mild mucosal thickening of the left renal collecting system and possible areas of ureteral wall.Suspected  Pyonephrosis. Continue ceftriaxone for now.  Urine culture, blood culture should be followed.  Currently she is hemodynamically stable, afebrile.  No leukocytosis  Urinary retention: Foley was placed in the emergency department.  The bladder was not significantly distended as per CT scan so we will give her a voiding trial.  Start on tamsulosin.  Might need to reinsert the Foley if she retains.  She has history of urinary retention in the past  AKI: Baseline creatinine normal.  Kidney function has almost returned to baseline.  Continue gentle IV fluids for today.  Consider stopping fluids tomorrow.  Debility/deconditioning:  She is mostly bedbound.  She had a fracture of hip in October last year.  History of frequent falls .CT imaging showed new T10 compression fracture since February 2019,remote compression fractures of L1 and L4.  She does not complain of severe back pain so kyphoplasty is not necessary at this point.  PT/OT requested.  Normocytic anemia: Most likely associated  with anemia of chronic disease.  Continue to monitor CBC  Non-small cell right lung cancer: Currently in remission.  Status post lobectomy.  Follows with Dr. Julien Nordmann.  On oral chemotherapy with Tagrisso.  History of brain metastasis, with resection.  History of dementia: Currently alert and oriented.  On Aricept from home.  History of DVT: Continue Xarelto.  Constipation: Continue bowel regimen      DVT prophylaxis: Xarelto Code Status: Full Family Communication: Husband on phone today.Her husband is a retired Photographer. Disposition Plan: Patient is from home.  She lives with her husband.She is waiting for PT/OT evaluation.  She is clinically not stable for discharge yet.   Consultants: None  Procedures: None  Antimicrobials:  Anti-infectives (From admission, onward)   Start     Dose/Rate Route Frequency Ordered Stop   09/10/19 2000  cefTRIAXone (ROCEPHIN) 1 g in sodium chloride 0.9 % 100 mL IVPB     1 g 200 mL/hr over 30 Minutes Intravenous Every 24 hours 09/10/19 1901     09/10/19 1745  cefTRIAXone (ROCEPHIN) 1 g in sodium chloride 0.9 % 100 mL IVPB  Status:  Discontinued     1 g 200 mL/hr over 30 Minutes Intravenous  Once 09/10/19 1732 09/10/19 1814      Subjective: Patient seen and examined at the  bedside this morning.  Hemodynamically stable.  She looks much better today.  She says she feels better but still weak.  Denies any chest pain or shortness of breath or abdominal pain.  Voiding by urinary catheter.  No dysuria at present.  Objective: Vitals:   09/10/19 1835 09/10/19 2137 09/11/19 0521 09/11/19  0935  BP:  113/70 96/60 (!) 125/53  Pulse:  85 76 76  Resp:  16 16 16   Temp:  98.3 F (36.8 C) (!) 97.4 F (36.3 C) 98.2 F (36.8 C)  TempSrc:  Oral Oral Oral  SpO2:  98% 97% 98%  Weight: 81.6 kg     Height: 5\' 9"  (1.753 m)       Intake/Output Summary (Last 24 hours) at 09/11/2019 1309 Last data filed at 09/11/2019 1250 Gross per 24 hour  Intake 2937.25 ml  Output 1602 ml  Net 1335.25 ml   Filed Weights   09/10/19 1835  Weight: 81.6 kg    Examination:  General exam: Debilitated, deconditioned,pleasant elderly female, appears chronically ill, weak Respiratory system: Bilateral equal air entry, normal vesicular breath sounds, no wheezes or crackles  Cardiovascular system: S1 & S2 heard, RRR. No JVD, murmurs, rubs, gallops or clicks. No pedal edema. Gastrointestinal system: Abdomen is nondistended, soft and nontender. Normal bowel sounds heard. Central nervous system: Alert and oriented. No focal neurological deficits. Extremities: No edema, no clubbing ,no cyanosis, distal peripheral pulses palpable. Skin: No rashes, lesions or ulcers,no icterus ,no pallor GU: Foley    Data Reviewed: I have personally reviewed following labs and imaging studies  CBC: Recent Labs  Lab 09/10/19 1155 09/11/19 0406  WBC 5.3 3.6*  NEUTROABS 3.8  --   HGB 10.3* 8.7*  HCT 34.7* 28.6*  MCV 92.8 92.0  PLT 284 361   Basic Metabolic Panel: Recent Labs  Lab 09/10/19 1155 09/11/19 0406  NA 142 142  K 3.3* 3.5  CL 104 110  CO2 27 23  GLUCOSE 110* 127*  BUN 41* 30*  CREATININE 1.84* 1.10*  CALCIUM 9.9 8.8*   GFR: Estimated Creatinine Clearance: 51.3 mL/min (A) (by C-G formula based on SCr of 1.1 mg/dL (H)). Liver Function Tests: Recent Labs  Lab 09/10/19 1155  AST 10*  ALT 8  ALKPHOS 65  BILITOT 0.6  PROT 6.8  ALBUMIN 3.4*   No results for input(s): LIPASE, AMYLASE in the last 168 hours. No results for input(s): AMMONIA in the last 168 hours. Coagulation Profile: No  results for input(s): INR, PROTIME in the last 168 hours. Cardiac Enzymes: No results for input(s): CKTOTAL, CKMB, CKMBINDEX, TROPONINI in the last 168 hours. BNP (last 3 results) No results for input(s): PROBNP in the last 8760 hours. HbA1C: No results for input(s): HGBA1C in the last 72 hours. CBG: No results for input(s): GLUCAP in the last 168 hours. Lipid Profile: No results for input(s): CHOL, HDL, LDLCALC, TRIG, CHOLHDL, LDLDIRECT in the last 72 hours. Thyroid Function Tests: No results for input(s): TSH, T4TOTAL, FREET4, T3FREE, THYROIDAB in the last 72 hours. Anemia Panel: No results for input(s): VITAMINB12, FOLATE, FERRITIN, TIBC, IRON, RETICCTPCT in the last 72 hours. Sepsis Labs: No results for input(s): PROCALCITON, LATICACIDVEN in the last 168 hours.  Recent Results (from the past 240 hour(s))  SARS CORONAVIRUS 2 (TAT 6-24 HRS) Nasopharyngeal Nasopharyngeal Swab     Status: None   Collection Time: 09/10/19  3:13 PM   Specimen: Nasopharyngeal Swab  Result Value Ref Range Status   SARS Coronavirus 2 NEGATIVE NEGATIVE Final  Comment: (NOTE) SARS-CoV-2 target nucleic acids are NOT DETECTED. The SARS-CoV-2 RNA is generally detectable in upper and lower respiratory specimens during the acute phase of infection. Negative results do not preclude SARS-CoV-2 infection, do not rule out co-infections with other pathogens, and should not be used as the sole basis for treatment or other patient management decisions. Negative results must be combined with clinical observations, patient history, and epidemiological information. The expected result is Negative. Fact Sheet for Patients: SugarRoll.be Fact Sheet for Healthcare Providers: https://www.woods-mathews.com/ This test is not yet approved or cleared by the [place] FDA and  has been authorized for detection and/or diagnosis of SARS-CoV-2 by FDA under an Emergency Use  Authorization (EUA). This EUA will remain  in effect (meaning this test can be used) for the duration of the COVID-19 declaration under Section 56 4(b)(1) of the Act, 21 U.S.C. section 360bbb-3(b)(1), unless the authorization is terminated or revoked sooner. Performed at [facility], [place] [address], [place], [place] [address]          Radiology Studies: CT ABDOMEN PELVIS WO CONTRAST  Result Date: 09/10/2019 CLINICAL DATA:  Abdominal pain and diarrhea. History non-small cell lung cancer. EXAM: CT ABDOMEN AND PELVIS WITHOUT CONTRAST TECHNIQUE: Multidetector CT imaging of the abdomen and pelvis was performed following the standard protocol without IV contrast. COMPARISON:  10/04/2017 FINDINGS: Lower chest: Remote postoperative changes involving the lower left hemithorax with chronic pleural thickening and left basilar atelectasis. Small persistent loculated pleural fluid. The right lung base is clear. Moderate eventration of both hemidiaphragms. The heart is normal in size. Stable vascular calcifications. Hepatobiliary: No focal hepatic lesions or intrahepatic biliary dilatation. The gallbladder is grossly normal. No common bile duct dilatation. Pancreas: No mass, inflammation or ductal dilatation. Spleen: Normal size. No focal lesions. Adrenals/Urinary Tract: The adrenal glands are unremarkable. Small left renal calculi are noted. There appears to be mild mucosal thickening of the collecting system in the left renal pelvis. A few areas of possible ureteral wall thickening or also suspected. Could not exclude Pyo ureteral nephrosis. Recommend correlation with urinalysis. No obstructing ureteral calculi identified. No worrisome renal lesions. 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 No bladder mass, calculi or asymmetric bladder wall thickening. Mild bladder distention. Stomach/Bowel: The stomach, duodenum, small bowel and colon are grossly normal without oral contrast.  No acute inflammatory changes, mass lesions or obstructive findings. The terminal ileum and appendix are normal. Suspect small appendicoliths. Moderate stool is noted in the rectum with mild lower rectal wall thickening. Findings suggest fecal impaction. Vascular/Lymphatic: The aorta is normal in caliber. Minimal atheroscerlotic calcifications. No mesenteric of retroperitoneal mass or adenopathy. Small scattered lymph nodes are noted. Reproductive: Posterior uterine fibroid again noted. The ovaries appear normal. Other: No pelvic mass or adenopathy. No free pelvic fluid collections. No inguinal mass or adenopathy. No abdominal wall hernia or subcutaneous lesions. Musculoskeletal: No significant bony findings. There are remote compression fractures of L1 and L4. There is a new compression fracture of T10 since February 2019. IMPRESSION: 1. Small left renal calculi but no obstructing ureteral calculi or bladder calculi. 2. Mild mucosal thickening of the left renal collecting system and possible areas of ureteral wall thickening. Recommend correlation with urinalysis. Possible pyonephrosis. 3. Moderate stool in the rectum and mild lower rectal wall thickening suggesting fecal impaction. 4. 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 5. New T10 compression fracture since February 2019. Remote compression fractures of L1 and L4.  Electronically Signed   By: Marijo Sanes M.D.   On: 09/10/2019 14:39        Scheduled Meds: . vitamin C  250 mg Oral q1800  . Chlorhexidine Gluconate Cloth  6 each Topical Daily  . cholecalciferol  1,000 Units Oral QPM  . donepezil  10 mg Oral QPM  . multivitamin with minerals  1 tablet Oral q1800  . polyethylene glycol  17 g Oral Daily  . rivaroxaban  20 mg Oral QPM  . tamsulosin  0.4 mg Oral QPC supper   Continuous Infusions: . sodium chloride    . 0.9 % NaCl with KCl 40 mEq / L 125 mL/hr (09/11/19 1145)  . cefTRIAXone (ROCEPHIN)  IV 1 g (09/10/19 2046)      LOS: 1 day    Time spent: 35 mins.More than 50% of that time was spent in counseling and/or coordination of care.      Shelly Coss, MD [facility] Pager [phone]  If 7PM-7AM, please contact night-coverage [email or web address] Password [facility] 09/11/2019, 1:09 PM

## 2019-09-11 NOTE — Plan of Care (Signed)

## 2019-09-12 ENCOUNTER — Inpatient Hospital Stay (HOSPITAL_COMMUNITY): Payer: Medicare PPO

## 2019-09-12 LAB — CBC WITH DIFFERENTIAL/PLATELET
Abs Immature Granulocytes: 0.11 10*3/uL — ABNORMAL HIGH (ref 0.00–0.07)
Basophils Absolute: 0 10*3/uL (ref 0.0–0.1)
Basophils Relative: 1 %
Eosinophils Absolute: 0.4 10*3/uL (ref 0.0–0.5)
Eosinophils Relative: 7 %
HCT: 29.7 % — ABNORMAL LOW (ref 36.0–46.0)
Hemoglobin: 8.8 g/dL — ABNORMAL LOW (ref 12.0–15.0)
Immature Granulocytes: 2 %
Lymphocytes Relative: 20 %
Lymphs Abs: 1 10*3/uL (ref 0.7–4.0)
MCH: 27.9 pg (ref 26.0–34.0)
MCHC: 29.6 g/dL — ABNORMAL LOW (ref 30.0–36.0)
MCV: 94.3 fL (ref 80.0–100.0)
Monocytes Absolute: 0.5 10*3/uL (ref 0.1–1.0)
Monocytes Relative: 10 %
Neutro Abs: 3 10*3/uL (ref 1.7–7.7)
Neutrophils Relative %: 60 %
Platelets: 222 10*3/uL (ref 150–400)
RBC: 3.15 MIL/uL — ABNORMAL LOW (ref 3.87–5.11)
RDW: 14.9 % (ref 11.5–15.5)
WBC: 4.9 10*3/uL (ref 4.0–10.5)
nRBC: 0 % (ref 0.0–0.2)

## 2019-09-12 LAB — BASIC METABOLIC PANEL
Anion gap: 7 (ref 5–15)
BUN: 15 mg/dL (ref 8–23)
CO2: 25 mmol/L (ref 22–32)
Calcium: 9 mg/dL (ref 8.9–10.3)
Chloride: 107 mmol/L (ref 98–111)
Creatinine, Ser: 0.79 mg/dL (ref 0.44–1.00)
GFR calc Af Amer: >60 mL/min (ref >60)
GFR calc non Af Amer: >60 mL/min (ref >60)
Glucose, Bld: 95 mg/dL (ref 70–99)
Potassium: 4.2 mmol/L (ref 3.5–5.1)
Sodium: 139 mmol/L (ref 135–145)

## 2019-09-12 NOTE — Progress Notes (Signed)
Rehab Admissions Coordinator Note:  Per PT/OT recommendation, patient was screened by Michel Santee for appropriateness for an Inpatient Acute Rehab Consult.  At this time, pt does not appear to have medical necessity to warrant and inpatient rehab admit.  Recommend f/u with a lower level of care.   Michel Santee 09/12/2019, 3:57 PM  I can be reached at [phone].

## 2019-09-12 NOTE — Evaluation (Signed)
Occupational Therapy Evaluation Patient Details Name: Valerie [name] MRN: [id number] DOB: January 31, 1946 Today's Date: 09/12/2019    History of Present Illness Pt is a 74 y/o female admitted with c/o rectal pain.  Pt with hx of falling at home 06/10/19 in which she sustained a R distal femur fx and R tibial plateau fx. Pt is now s/p ORIF R LE and per pt has been advanced to WBAT R LE. PMH includes dementia, metastatic NSCL cancer and hx of resection of a brain tumor.   Clinical Impression   PTA Pt was at home, since October has been using lift equipment with [place] therapies, functioning from a wc level and been working towards standing at sink for grooming/bathing with assist from an aide. When she was last hospitalized back in Oct she was max to total A +2 for bed mobility, and lift only. Today she is min A for bed mobility, and mod A +2 for multiple sit<>stand with STEDY. Pt is currently max A for LB ADL and set up to min A for UB ADL. Pt really wants to be able to get away from using the lift and return to PLOF before her fall in October (she was independent and using a Rollator). She has shown progress since her discharge, and is motivated to participate at the CIR level to maximize safety and independence in ADL and transfers as well as work on her strength/balance to re-gain her PLOF. As she starts the CIR process, she would like her husband present so they can make a decision together. OT will continue to follow acutely.   Of note: Pt wears pull ups at home, make sure you have on diaper/pull ups for standing. Husband is supposed to be bringing shoes and hopefully pull ups.     Follow Up Recommendations  CIR    Equipment Recommendations  None recommended by OT(Pt has appropriate DME at home)    Recommendations for Other Services Rehab consult     Precautions / Restrictions Precautions Precautions: Fall Precaution Comments: incontinence of bladder in standing (wears pull-ups at  home) Restrictions Weight Bearing Restrictions: No      Mobility Bed Mobility Overal bed mobility: Needs Assistance Bed Mobility: Supine to Sit     Supine to sit: Min assist     General bed mobility comments: increased time with assist for R LE and to control trunk  Transfers Overall transfer level: Needs assistance Equipment used: Ambulation equipment used Transfers: Sit to/from Stand Sit to Stand: Mod assist;+2 physical assistance;+2 safety/equipment;From elevated surface         General transfer comment: vc for sequencing, improved ability to bring weight forward this session, mod A +2 for boost from elevated bed; Pt fearful of falling    Balance Overall balance assessment: Needs assistance Sitting-balance support: No upper extremity supported;Feet supported Sitting balance-Leahy Scale: Fair Sitting balance - Comments: fatigues EOB without back support   Standing balance support: Bilateral upper extremity supported Standing balance-Leahy Scale: Poor                             ADL either performed or assessed with clinical judgement   ADL Overall ADL's : Needs assistance/impaired Eating/Feeding: Set up;Sitting   Grooming: Wash/dry face;Set up;Sitting;Oral care Grooming Details (indicate cue type and reason): in recliner Upper Body Bathing: Minimal assistance Upper Body Bathing Details (indicate cue type and reason): for back Lower Body Bathing: Maximal assistance;Sit to/from stand   Upper  Body Dressing : Sitting;Minimal assistance Upper Body Dressing Details (indicate cue type and reason): to don gown like robe Lower Body Dressing: Maximal assistance;+2 for physical assistance;+2 for safety/equipment;Sit to/from stand Lower Body Dressing Details (indicate cue type and reason): to don diaper, and socks Toilet Transfer: Moderate assistance;+2 for physical assistance;+2 for safety/equipment(use of STEDY for pivot)   Toileting- Clothing Manipulation  and Hygiene: Maximal assistance;Sitting/lateral lean;Minimal assistance Toileting - Clothing Manipulation Details (indicate cue type and reason): initially rolling for rear peri care in the bed, then able to clean front with wash cloth with lateral leans sitting EOB     Functional mobility during ADLs: (use of STEDY)       Vision Baseline Vision/History: Wears glasses Wears Glasses: At all times Patient Visual Report: No change from baseline       Perception     Praxis      Pertinent Vitals/Pain Pain Assessment: No/denies pain     Hand Dominance Right   Extremity/Trunk Assessment Upper Extremity Assessment Upper Extremity Assessment: Generalized weakness   Lower Extremity Assessment Lower Extremity Assessment: Defer to PT evaluation RLE Deficits / Details: AAROM DF -10; knee 75, hip 75; strength 3-/5 with pt able to SLR    Cervical / Trunk Assessment Cervical / Trunk Assessment: Kyphotic   Communication Communication Communication: No difficulties   Cognition Arousal/Alertness: Awake/alert Behavior During Therapy: WFL for tasks assessed/performed Overall Cognitive Status: Within Functional Limits for tasks assessed                                     General Comments  sits very quickly    Exercises     Shoulder Instructions      Home Living Family/patient expects to be discharged to:: Private residence Living Arrangements: Spouse/significant other Available Help at Discharge: Family Type of Home: House Home Access: Ramped entrance     Home Layout: Able to live on main level with bedroom/bathroom     Bathroom Shower/Tub: Occupational psychologist: Standard     Home Equipment: Environmental consultant - 4 wheels;Grab bars - tub/shower;Bedside commode;Walker - 2 wheels;Wheelchair - manual(mechanical lift)          Prior Functioning/Environment Level of Independence: Needs assistance  Gait / Transfers Assistance Needed: Pt stood at sink only -  nonambulatory  ADL's / Homemaking Assistance Needed: Spouse and aide   Comments: Pt used mechanical lift to transfer bed to [facility] and stood at sink only.          OT Problem List: Decreased activity tolerance;Decreased strength;Decreased range of motion;Impaired balance (sitting and/or standing);Obesity      OT Treatment/Interventions: Self-care/ADL training;Therapeutic exercise;DME and/or AE instruction;Energy conservation;Therapeutic activities;Patient/family education;Balance training    OT Goals(Current goals can be found in the care plan section) Acute Rehab OT Goals Patient Stated Goal: Regain IND OT Goal Formulation: With patient Time For Goal Achievement: 09/26/19 Potential to Achieve Goals: Good  OT Frequency: Min 2X/week   Barriers to D/C:            Co-evaluation PT/OT/SLP Co-Evaluation/Treatment: Yes Reason for Co-Treatment: For patient/therapist safety;To address functional/ADL transfers PT goals addressed during session: Mobility/safety with mobility;Balance;Strengthening/ROM OT goals addressed during session: ADL's and self-care;Proper use of Adaptive equipment and DME;Strengthening/ROM      AM-PAC OT "6 Clicks" Daily Activity     Outcome Measure Help from another person eating meals?: A Little Help from another person taking care  of personal grooming?: A Little Help from another person toileting, which includes using toliet, bedpan, or urinal?: A Lot Help from another person bathing (including washing, rinsing, drying)?: A Lot Help from another person to put on and taking off regular upper body clothing?: A Little Help from another person to put on and taking off regular lower body clothing?: A Lot 6 Click Score: 15   End of Session Equipment Utilized During Treatment: Gait belt Nurse Communication: Mobility status;Need for lift equipment(Stedy - or- lift pad in place)  Activity Tolerance: Patient tolerated treatment well Patient left: in chair;with call  bell/phone within reach  OT Visit Diagnosis: Unsteadiness on feet (R26.81);Other abnormalities of gait and mobility (R26.89);Muscle weakness (generalized) (M62.81);History of falling (Z91.81)                Time: 1040-1120 OT Time Calculation (min): 40 min Charges:  OT General Charges $OT Visit: 1 Visit OT Evaluation $OT Eval Moderate Complexity: 1 Mod OT Treatments $Self Care/Home Management : 8-22 mins  Jesse Sans OTR/L Acute Rehabilitation Services Pager: [phone] Office: [place] 09/12/2019, 12:25 PM

## 2019-09-12 NOTE — Plan of Care (Signed)

## 2019-09-12 NOTE — Progress Notes (Addendum)
Physical Therapy Treatment Patient Details Name: Valerie [name] MRN: [id number] DOB: 1946-01-26 Today's Date: 09/12/2019    History of Present Illness Pt is a 74 y/o female admitted with c/o rectal pain.  Pt with hx of falling at home 06/10/19 in which she sustained a R distal femur fx and R tibial plateau fx. Pt is now s/p ORIF R LE and per pt has been advanced to WBAT R LE. PMH includes dementia, metastatic NSCL cancer and hx of resection of a brain tumor.    PT Comments    Pt cooperative but requiring increased time and encouragement to attempt new tasks - pt fearful of falling.  This date, pt focusing on bed mobility, sitting balance and sit<>stand with use of Stedy.  Pt up to chair utilizing Stedy.  Pt has been non-ambulatory at home since tib/fib fx 10/20 and relying on mechanical lift to transfer bed to chair.  Pt is now WB as tolerated and demonstrating potential to regain significant function with focussed skilled PT/OT intervention.  Follow Up Recommendations  CIR     Equipment Recommendations  None recommended by PT    Recommendations for Other Services OT consult     Precautions / Restrictions Precautions Precautions: Fall Precaution Comments: incontinence of bladder in standing (wears pull-ups at home) Restrictions Weight Bearing Restrictions: No    Mobility  Bed Mobility Overal bed mobility: Needs Assistance Bed Mobility: Supine to Sit     Supine to sit: Min assist     General bed mobility comments: increased time with assist for R LE and to control trunk  Transfers Overall transfer level: Needs assistance Equipment used: Ambulation equipment used Transfers: Sit to/from Stand Sit to Stand: Mod assist;+2 physical assistance;+2 safety/equipment;From elevated surface         General transfer comment: vc for sequencing, improved ability to bring weight forward this session, mod A +2 for boost from elevated bed; Pt fearful of  falling  Ambulation/Gait             General Gait Details: Pt stood with stedy only and utilized same to transfer bed to chair   Stairs             Wheelchair Mobility    Modified Rankin (Stroke Patients Only)       Balance Overall balance assessment: Needs assistance Sitting-balance support: No upper extremity supported;Feet supported Sitting balance-Leahy Scale: Fair Sitting balance - Comments: fatigues EOB without back support   Standing balance support: Bilateral upper extremity supported Standing balance-Leahy Scale: Poor Standing balance comment: posterior lean                            Cognition Arousal/Alertness: Awake/alert Behavior During Therapy: WFL for tasks assessed/performed Overall Cognitive Status: Within Functional Limits for tasks assessed                                        Exercises      General Comments General comments (skin integrity, edema, etc.): sits very quickly      Pertinent Vitals/Pain Pain Assessment: No/denies pain    Home Living Family/patient expects to be discharged to:: Private residence Living Arrangements: Spouse/significant other Available Help at Discharge: Family Type of Home: House Home Access: Ramped entrance   Home Layout: Able to live on main level with bedroom/bathroom Home Equipment: Walker - 4 wheels;Grab  bars - tub/shower;Bedside commode;Walker - 2 wheels;Wheelchair - manual(mechanical lift)      Prior Function Level of Independence: Needs assistance  Gait / Transfers Assistance Needed: Pt stood at sink only - nonambulatory  ADL's / Homemaking Assistance Needed: Spouse and aide Comments: Pt used mechanical lift to transfer bed to [facility] and stood at sink only.     PT Goals (current goals can now be found in the care plan section) Acute Rehab PT Goals Patient Stated Goal: Regain IND PT Goal Formulation: With patient Time For Goal Achievement: 09/25/19 Potential to  Achieve Goals: Fair Progress towards PT goals: Progressing toward goals    Frequency    Min 3X/week      PT Plan Discharge plan needs to be updated    Co-evaluation PT/OT/SLP Co-Evaluation/Treatment: Yes Reason for Co-Treatment: For patient/therapist safety PT goals addressed during session: Mobility/safety with mobility OT goals addressed during session: ADL's and self-care      AM-PAC PT "6 Clicks" Mobility   Outcome Measure  Help needed turning from your back to your side while in a flat bed without using bedrails?: A Little Help needed moving from lying on your back to sitting on the side of a flat bed without using bedrails?: A Little Help needed moving to and from a bed to a chair (including a wheelchair)?: Total Help needed standing up from a chair using your arms (e.g., wheelchair or bedside chair)?: A Lot Help needed to walk in hospital room?: Total Help needed climbing 3-5 steps with a railing? : Total 6 Click Score: 11    End of Session Equipment Utilized During Treatment: Gait belt Activity Tolerance: Patient tolerated treatment well;Patient limited by fatigue Patient left: in chair;with call bell/phone within reach Nurse Communication: Mobility status;Need for lift equipment PT Visit Diagnosis: Difficulty in walking, not elsewhere classified (R26.2);Muscle weakness (generalized) (M62.81)     Time: 2122-4825 PT Time Calculation (min) (ACUTE ONLY): 43 min  Charges:  $Therapeutic Activity: 23-37 mins                     Debe Coder PT Acute Rehabilitation Services Pager [phone] Office [phone]    [facility] 09/12/2019, 12:36 PM

## 2019-09-12 NOTE — Progress Notes (Signed)
PROGRESS NOTE    Valerie [name]  GNF:[id number] DOB: [birth year] DOA: 09/10/2019 PCP: [name], MD   Brief Narrative: Patient is a 74 year-old female with history of metastatic nonsmall cell lung cancer status post lobectomy,  currently in remission and on chemo pill, resection of brain metastatic lesion, DVT on Xarelto, dementia, recurrent urinary tract infection who presented from home with complaints of dysuria, poor oral intake, dehydration.  No report of fever or chills on presentation.  On presentation she was found to have acute kidney injury, urinary retention.  Foley was placed in the emergency department.  Started on antibiotic for urinary tract infection.  Admitted for the management of urinary tract infection, dehydration, AKI.  09/12/2019: Acute kidney injury has resolved.  Urine culture is growing gram-negative rods.  Final cultures are pending.  We will continue antibiotics for now.  Patient also had fecal impaction.  Patient has had an enema as per collateral information.  Repeat abdominal KUB reveals normal gas pattern.  Physical therapy input is appreciated, CIR is recommended.  Will consult rehab medicine team.  Updated patient's husband extensively.  Patient's husband is a retired Engineer, drilling.  All questions were answered.  Assessment & Plan:   Principal Problem:   Pyelonephritis Active Problems:   Primary cancer of right upper lobe of lung (HCC)   Urinary retention   Suspected pyonephritis/urinary tract infection: Presented with dysuria.  Has history of frequent UTI.  UA was grossly suggestive of urinary tract infection. CT abdomen/pelvis showed small left renal calculi but no obstructing ureteral calculi or bladder calculi,mild mucosal thickening of the left renal collecting system and possible areas of ureteral wall.Suspected  Pyonephrosis. Continue ceftriaxone for now.  Urine culture, blood culture should be followed.  Currently she is hemodynamically stable,  afebrile.  No leukocytosis 09/12/2019: Follow final cultures.  Continue antibiotics for now.  Urinary retention: Foley was placed in the emergency department.  The bladder was not significantly distended as per CT scan so we will give her a voiding trial.  Start on tamsulosin.  Might need to reinsert the Foley if she retains.  She has history of urinary retention in the past  09/12/2019: Follow with urology on discharge.  Patient is known to the urology team.  Complete treatment for UTI.  AKI: Baseline creatinine normal.  Kidney function has almost returned to baseline.  Continue gentle IV fluids for today.  Consider stopping fluids tomorrow. 09/12/2019: Resolved.  Debility/deconditioning: She is mostly bedbound.  She had a fracture of hip in October last year.  History of frequent falls .CT imaging showed new T10 compression fracture since February 2019,remote compression fractures of L1 and L4.  She does not complain of severe back pain so kyphoplasty is not necessary at this point.  PT/OT requested. 09/12/2019: For rehab.  Normocytic anemia: Most likely associated  with anemia of chronic disease.  Continue to monitor CBC  Non-small cell right lung cancer: Currently in remission.  Status post lobectomy.  Follows with Dr. Julien Nordmann.  On oral chemotherapy with Tagrisso.  History of brain metastasis, with resection.  History of dementia: Currently alert and oriented.  On Aricept from home.  History of DVT: Continue Xarelto.  Constipation:  Continue bowel regimen 09/12/2019: Repeat abdominal KUB is nonrevealing.  DVT prophylaxis: Xarelto Code Status: Full Family Communication: Husband. Disposition Plan: Rehab.  Consultants: Rehab team consulted.  Procedures: None  Antimicrobials:  Anti-infectives (From admission, onward)   Start     Dose/Rate Route Frequency Ordered Stop  09/10/19 2000  cefTRIAXone (ROCEPHIN) 1 g in sodium chloride 0.9 % 100 mL IVPB     1 g 200 mL/hr over 30 Minutes  Intravenous Every 24 hours 09/10/19 1901     09/10/19 1745  cefTRIAXone (ROCEPHIN) 1 g in sodium chloride 0.9 % 100 mL IVPB  Status:  Discontinued     1 g 200 mL/hr over 30 Minutes Intravenous  Once 09/10/19 1732 09/10/19 1814      Subjective: Patient seen alongside patient's husband. No new complaints. No fever or chills. No chest pain. No shortness of breath.  Objective: Vitals:   09/11/19 0935 09/11/19 1328 09/11/19 2124 09/12/19 0526  BP: (!) 125/53 119/82 136/70 137/63  Pulse: 76 84 80 76  Resp: 16 16 16 16   Temp: 98.2 F (36.8 C) (!) 97.5 F (36.4 C) 98.1 F (36.7 C) 98.1 F (36.7 C)  TempSrc: Oral Oral Oral Oral  SpO2: 98% 99% 100% 98%  Weight:      Height:        Intake/Output Summary (Last 24 hours) at 09/12/2019 1334 Last data filed at 09/12/2019 1030 Gross per 24 hour  Intake 1639.73 ml  Output 1761 ml  Net -121.27 ml   Filed Weights   09/10/19 1835  Weight: 81.6 kg    Examination: General exam: Not in any distress.  Patient is awake and alert. Respiratory system: Clear to auscultation. Cardiovascular system: S1 & S2 heard Gastrointestinal system: Abdomen is nondistended, soft and nontender. Normal bowel sounds heard. Central nervous system: Awake and alert.  Patient moves all extremities.   Extremities: No leg edema.  Data Reviewed: I have personally reviewed following labs and imaging studies  CBC: Recent Labs  Lab 09/10/19 1155 09/11/19 0406 09/12/19 0436  WBC 5.3 3.6* 4.9  NEUTROABS 3.8  --  3.0  HGB 10.3* 8.7* 8.8*  HCT 34.7* 28.6* 29.7*  MCV 92.8 92.0 94.3  PLT 284 218 220   Basic Metabolic Panel: Recent Labs  Lab 09/10/19 1155 09/11/19 0406 09/12/19 0612  NA 142 142 139  K 3.3* 3.5 4.2  CL 104 110 107  CO2 27 23 25   GLUCOSE 110* 127* 95  BUN 41* 30* 15  CREATININE 1.84* 1.10* 0.79  CALCIUM 9.9 8.8* 9.0   GFR: Estimated Creatinine Clearance: 70.5 mL/min (by C-G formula based on SCr of 0.79 mg/dL). Liver Function  Tests: Recent Labs  Lab 09/10/19 1155  AST 10*  ALT 8  ALKPHOS 65  BILITOT 0.6  PROT 6.8  ALBUMIN 3.4*   No results for input(s): LIPASE, AMYLASE in the last 168 hours. No results for input(s): AMMONIA in the last 168 hours. Coagulation Profile: No results for input(s): INR, PROTIME in the last 168 hours. Cardiac Enzymes: No results for input(s): CKTOTAL, CKMB, CKMBINDEX, TROPONINI in the last 168 hours. BNP (last 3 results) No results for input(s): PROBNP in the last 8760 hours. HbA1C: No results for input(s): HGBA1C in the last 72 hours. CBG: No results for input(s): GLUCAP in the last 168 hours. Lipid Profile: No results for input(s): CHOL, HDL, LDLCALC, TRIG, CHOLHDL, LDLDIRECT in the last 72 hours. Thyroid Function Tests: No results for input(s): TSH, T4TOTAL, FREET4, T3FREE, THYROIDAB in the last 72 hours. Anemia Panel: No results for input(s): VITAMINB12, FOLATE, FERRITIN, TIBC, IRON, RETICCTPCT in the last 72 hours. Sepsis Labs: No results for input(s): PROCALCITON, LATICACIDVEN in the last 168 hours.  Recent Results (from the past 240 hour(s))  SARS CORONAVIRUS 2 (TAT 6-24 HRS) Nasopharyngeal Nasopharyngeal Swab  Status: None   Collection Time: 09/10/19  3:13 PM   Specimen: Nasopharyngeal Swab  Result Value Ref Range Status   SARS Coronavirus 2 NEGATIVE NEGATIVE Final    Comment: (NOTE) SARS-CoV-2 target nucleic acids are NOT DETECTED. The SARS-CoV-2 RNA is generally detectable in upper and lower respiratory specimens during the acute phase of infection. Negative results do not preclude SARS-CoV-2 infection, do not rule out co-infections with other pathogens, and should not be used as the sole basis for treatment or other patient management decisions. Negative results must be combined with clinical observations, patient history, and epidemiological information. The expected result is Negative. Fact Sheet for  Patients: SugarRoll.be Fact Sheet for Healthcare Providers: https://www.woods-mathews.com/ This test is not yet approved or cleared by the [place] FDA and  has been authorized for detection and/or diagnosis of SARS-CoV-2 by FDA under an Emergency Use Authorization (EUA). This EUA will remain  in effect (meaning this test can be used) for the duration of the COVID-19 declaration under Section 56 4(b)(1) of the Act, 21 U.S.C. section 360bbb-3(b)(1), unless the authorization is terminated or revoked sooner. Performed at [facility], [place] [address], [place], [place] [address]   Culture, Urine     Status: Abnormal (Preliminary result)   Collection Time: 09/11/19 12:00 AM   Specimen: Urine, Random  Result Value Ref Range Status   Specimen Description   Final    URINE, RANDOM Performed at [place] [address], [place], [place] [address]    Special Requests   Final    NONE Performed at [facility], [place] [address], [place], [place] [address]    Culture (A)  Final    80,000 COLONIES/mL GRAM NEGATIVE RODS IDENTIFICATION AND SUSCEPTIBILITIES TO FOLLOW Performed at [facility], [place] [address] [place], [place] [address]    Report Status PENDING  Incomplete  Culture, blood (routine x 2)     Status: None (Preliminary result)   Collection Time: 09/11/19  8:23 AM   Specimen: BLOOD  Result Value Ref Range Status   Specimen Description   Final    BLOOD RIGHT ARM Performed at [place] [address] [place], [place] [address]    Special Requests   Final    BOTTLES DRAWN AEROBIC AND ANAEROBIC Blood Culture adequate volume Performed at [place] [address] [place], [place] [address]    Culture   Final    NO GROWTH < 24 HOURS Performed at [place] [address] [place], [place] [address]    Report Status PENDING   Incomplete  Culture, blood (routine x 2)     Status: None (Preliminary result)   Collection Time: 09/11/19  8:23 AM   Specimen: BLOOD  Result Value Ref Range Status   Specimen Description   Final    BLOOD LEFT HAND Performed at [facility] [address] [place], [place] [address]    Special Requests   Final    BOTTLES DRAWN AEROBIC ONLY Blood Culture adequate volume Performed at [facility] [address], [place], [place] [address]    Culture   Final    NO GROWTH < 24 HOURS Performed at [facility] [address] [place], [place] [address]    Report Status PENDING  Incomplete         Radiology Studies: CT ABDOMEN PELVIS WO CONTRAST  Result Date: 09/10/2019 CLINICAL DATA:  Abdominal pain and diarrhea. History non-small cell lung cancer. EXAM: CT ABDOMEN AND PELVIS WITHOUT CONTRAST TECHNIQUE: Multidetector CT imaging of the abdomen and pelvis was performed following the standard protocol without IV contrast. COMPARISON:  10/04/2017 FINDINGS: Lower chest: Remote postoperative changes involving the lower left hemithorax with chronic pleural thickening and left basilar atelectasis. Small persistent loculated pleural fluid. The right lung base is clear. Moderate eventration of both hemidiaphragms. The heart is normal in size. Stable vascular calcifications. Hepatobiliary: No focal hepatic lesions or intrahepatic biliary dilatation. The gallbladder is grossly normal. No common bile duct dilatation. Pancreas: No mass, inflammation or ductal dilatation. Spleen: Normal size. No focal lesions. Adrenals/Urinary Tract: The adrenal glands are unremarkable. Small left renal calculi are noted. There appears to be mild mucosal thickening of the collecting system in the left renal pelvis. A few areas of possible ureteral wall thickening or also suspected. Could not exclude Pyo ureteral nephrosis. Recommend correlation with urinalysis. No obstructing  ureteral calculi identified. No worrisome renal lesions. 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 No bladder mass, calculi or asymmetric bladder wall thickening. Mild bladder distention. Stomach/Bowel: The stomach, duodenum, small bowel and colon are grossly normal without oral contrast. No acute inflammatory changes, mass lesions or obstructive findings. The terminal ileum and appendix are normal. Suspect small appendicoliths. Moderate stool is noted in the rectum with mild lower rectal wall thickening. Findings suggest fecal impaction. Vascular/Lymphatic: The aorta is normal in caliber. Minimal atheroscerlotic calcifications. No mesenteric of retroperitoneal mass or adenopathy. Small scattered lymph nodes are noted. Reproductive: Posterior uterine fibroid again noted. The ovaries appear normal. Other: No pelvic mass or adenopathy. No free pelvic fluid collections. No inguinal mass or adenopathy. No abdominal wall hernia or subcutaneous lesions. Musculoskeletal: No significant bony findings. There are remote compression fractures of L1 and L4. There is a new compression fracture of T10 since February 2019. IMPRESSION: 1. Small left renal calculi but no obstructing ureteral calculi or bladder calculi. 2. Mild mucosal thickening of the left renal collecting system and possible areas of ureteral wall thickening. Recommend correlation with urinalysis. Possible pyonephrosis. 3. Moderate stool in the rectum and mild lower rectal wall thickening suggesting fecal impaction. 4. 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 5. New T10 compression fracture since February 2019. Remote compression fractures of L1 and L4. Electronically Signed   By: Marijo Sanes M.D.   On: 09/10/2019 14:39        Scheduled Meds: . vitamin C  250 mg Oral q1800  . Chlorhexidine Gluconate Cloth  6 each Topical Daily  . cholecalciferol  1,000 Units Oral QPM  . donepezil  10 mg Oral QPM  . multivitamin  with minerals  1 tablet Oral q1800  . polyethylene glycol  17 g Oral Daily  . rivaroxaban  20 mg Oral QPM  . tamsulosin  0.4 mg Oral QPC supper   Continuous Infusions: . cefTRIAXone (ROCEPHIN)  IV 1 g (09/11/19 2018)     LOS: 2 days    Time spent: 35 mins.   Bonnell Public, MD [facility]  If 7PM-7AM, please contact night-coverage [email or web address] Password [facility] 09/12/2019, 1:34 PM

## 2019-09-13 LAB — URINE CULTURE: Culture: 80000 — AB

## 2019-09-13 MED ORDER — SODIUM CHLORIDE 0.9 % IV SOLN: 500.0000 mg | Freq: Three times a day (TID) | INTRAVENOUS | Status: DC

## 2019-09-13 MED ORDER — SODIUM CHLORIDE 0.9 % IV SOLN
1.0000 g | Freq: Three times a day (TID) | INTRAVENOUS | Status: DC
  Administered 2019-09-13 – 2019-09-18 (×15): 1 g via INTRAVENOUS
  Filled 2019-09-13 (×16): qty 1

## 2019-09-13 NOTE — Progress Notes (Signed)
MD aware of Urine results showing Escherichia Coli.

## 2019-09-13 NOTE — TOC Initial Note (Signed)
Transition of Care [facility]) - Initial/Assessment Note    Patient Details  Name: Valerie [name] MRN: [id number] Date of Birth: 1945-11-21  Transition of Care [facility]) CM/SW Contact:    Valerie [name], [place] Phone Number: 09/13/2019, 9:47 AM  Clinical Narrative:                 Pt is a 74 y/o female admitted with c/o rectal pain.  Pt with hx of falling at home 06/10/19  sustaining  a R distal femur fracture and R tibial plateau fracture. Patient requested to speak with this social worker to discuss discharge planning. Per patient, she would like CIR, however patient made aware that she did not qualify for this level of care. Patient would like to go to rehab, preferably [facility] if possible or home with [facility]. Patient states that she resides with her husband and has a [employer] fast streamer, rollator walker, wheelchair and a hospital bed. She has had HH in the past but is not sure what agency she used.   This social worker to continue to follow patient for discharge planning.   Valerie [name], [place] Work [phone]  Expected Discharge Plan: Skilled Nursing Facility(patient would like to go into rehab following her hospitalization) Barriers to Discharge: No Barriers Identified   Patient Goals and CMS Choice Patient states their goals for this hospitalization and ongoing recovery are:: "I would like to be able to stand and walk"      Expected Discharge Plan and Services Expected Discharge Plan: Skilled Nursing Facility(patient would like to go into rehab following her hospitalization) In-house Referral: Clinical Social Work   Post Acute Care Choice: Skilled Nursing Facility(patient would like to go to a rehab following her hospitalization, preferably [facility]) Living arrangements for the past 2 months: [place]                                      Prior Living Arrangements/Services Living arrangements for the past 2 months: [place] with:: Spouse Patient language and need for interpreter reviewed:: Yes Do you feel safe going back to the place where you live?: Yes      Need for Family Participation in Patient Care: Yes (Comment) Care giver support system in place?: Yes (comment) Current home services: Home PT, Home OT Criminal Activity/Legal Involvement Pertinent to Current Situation/Hospitalization: No - Comment as needed  Activities of Daily Living Home Assistive Devices/Equipment: Dentures (specify type), Walker (specify type), Wheelchair, Other (Comment), Eyeglasses, Grab bars in shower, Grab bars around toilet, Hand-held shower hose(walk-in shower, upper partial plate, 4 wheeled walker, handicapped toilet seats) ADL Screening (condition at time of admission) Patient's cognitive ability adequate to safely complete daily activities?: Yes Is the patient deaf or have difficulty hearing?: No Does the patient have difficulty seeing, even when wearing glasses/contacts?: No Does the patient have difficulty concentrating, remembering, or making decisions?: No Patient able to express need for assistance with ADLs?: Yes Does the patient have difficulty dressing or bathing?: Yes Independently performs ADLs?: No Communication: Independent Dressing (OT): Needs assistance Is this a change from baseline?: Pre-admission baseline Grooming: Needs assistance Is this a change from baseline?: Pre-admission baseline Feeding: Needs assistance Is this a change from baseline?: Pre-admission baseline Bathing: Needs assistance Is this a change from baseline?: Pre-admission baseline Toileting: Dependent Is this a change from baseline?: Pre-admission baseline In/Out Bed: Dependent Is  this a change from baseline?: Pre-admission baseline Walks in Home: Dependent Is this a change from baseline?: Pre-admission baseline Does the patient have difficulty walking or climbing stairs?: Yes(secondary to weakness) Weakness of  Legs: Both Weakness of Arms/Hands: None  Permission Sought/Granted Permission sought to share information with : Facility Sport and exercise psychologist Permission granted to share information with : Yes, Verbal Permission Granted  Share Information with NAME: Valerie [name]     Permission granted to share info w Relationship: spouse  Permission granted to share info w Contact Information: [phone]  Emotional Assessment   Attitude/Demeanor/Rapport: Ambitious, Engaged, Gracious Affect (typically observed): Accepting, Adaptable Orientation: : Oriented to Self, Oriented to Place, Oriented to  Time, Oriented to Situation Alcohol / Substance Use: Not Applicable Psych Involvement: No (comment)  Admission diagnosis:  Urinary retention [R33.9] Pyelonephritis [N12] AKI (acute kidney injury) ([place]) [N17.9] Constipation, unspecified constipation type [K59.00] Patient Active Problem List   Diagnosis Date Noted  . Pyelonephritis 09/10/2019  . Acute blood loss anemia 06/12/2019  . Closed fracture of distal end of right femur, initial encounter ([place]) 06/11/2019  . Closed fracture of lateral portion of right tibial plateau 06/11/2019  . History of DVT of lower extremity 06/11/2019  . Blurry vision 01/06/2019  . Headache 07/24/2016  . Cough 08/24/2015  . Chronic diarrhea 08/24/2015  . Sepsis ([place])   . Encounter for antineoplastic chemotherapy 01/31/2015  . Urinary retention 10/20/2014  . Ileus ([place]) 10/20/2014  . Cellulitis of leg, right 10/20/2014  . Fracture of right proximal fibula   . Ankle fracture 10/02/2014  . Ankle fracture 10/02/2014  . Lung cancer ([place]) 10/02/2014  . Closed fibular fracture 10/02/2014  . Dementia ([place]) 10/02/2014  . Lower urinary tract infectious disease 10/02/2014  . Reactive airway disease 10/02/2014  . Fall 10/02/2014  . Closed left ankle fracture 10/01/2014  . Right fibular fracture 10/01/2014  . Lung metastasis ([place])   . LESION, ANUS 04/26/2009  .  DIARRHEA 04/26/2009  . Primary cancer of right upper lobe of lung ([place]) 04/22/2009  . Malignant neoplasm of brain ([place]) 04/22/2009  . VITAMIN D DEFICIENCY 04/22/2009  . ANAL FISTULA 04/22/2009  . OSTEOPOROSIS 04/22/2009   PCP:  [name], MD Pharmacy:   [place], [place] [address] Phone: [phone] Fax: [phone]     Social Determinants of Health (SDOH) Interventions    Readmission Risk Interventions No flowsheet data found.

## 2019-09-13 NOTE — Progress Notes (Addendum)
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  No report of fever or chills on presentation.  On presentation she was found to have acute kidney injury, urinary retention.  Foley was placed in the emergency department.  Started on antibiotic for urinary tract infection.  Admitted for the management of urinary tract infection, dehydration, AKI.  09/12/2019: Acute kidney injury has resolved.  Urine culture is growing gram-negative rods.  Final cultures are pending.  We will continue antibiotics for now.  Patient also had fecal impaction.  Patient has had an enema as per collateral information.  Repeat abdominal KUB reveals normal gas pattern.  Physical therapy input is appreciated, CIR is recommended.  Will consult rehab medicine team.  Updated patient's husband extensively.  All questions were answered.  09/13/2019: Patient seen.  Patient feels a lot better today.  Lose/watery stool reported today.  No fever or chills.  No abdominal pain.  Patient received recent enema.  Urine culture final result is still pending.  We will continue antibiotics for now.  Patient deemed not a candidate for CIR.  Patient is awaiting skilled nursing facility placement.  Hopefully, patient will be discharged once a bed is available.  Social work input is appreciated.  Assessment & Plan:   Principal Problem:   Pyelonephritis Active Problems:   Primary cancer of right upper lobe of lung (HCC)   Urinary retention   Suspected pyonephritis/urinary tract infection: Presented with dysuria.  Has history of frequent  UTI.  UA was grossly suggestive of urinary tract infection. CT abdomen/pelvis showed small left renal calculi but no obstructing ureteral calculi or bladder calculi,mild mucosal thickening of the left renal collecting system and possible areas of ureteral wall.Suspected  Pyonephrosis. Continue ceftriaxone for now.  Urine culture, blood culture should be followed.  Currently she is hemodynamically stable, afebrile.  No leukocytosis 09/13/2019: Follow final cultures.  Continue antibiotics for now.  Urinary retention: Foley was placed in the emergency department.  The bladder was not significantly distended as per CT scan so we will give her a voiding trial.  Start on tamsulosin.  Might need to reinsert the Foley if she retains.  She has history of urinary retention in the past  09/12/2019: Follow with urology on discharge.  Patient is known to the urology team.  Complete treatment for UTI.  AKI: Baseline creatinine normal.  Kidney function has almost returned to baseline.  Continue gentle IV fluids for today.  Consider stopping fluids tomorrow. 09/12/2019: Resolved.  Debility/deconditioning: She is mostly bedbound.  She had a fracture of hip in October last year.  History of frequent falls .CT imaging showed new T10 compression fracture since February 2019,remote compression fractures of L1 and L4.  She does not complain of severe back pain so kyphoplasty is not necessary at this point.  PT/OT requested. 09/12/2019: For SNF.  Normocytic anemia: Most likely associated  with anemia of chronic disease.  Continue to monitor CBC  Non-small cell right lung cancer: Currently in remission.  Status post lobectomy.  Follows with Dr. Julien Nordmann.  On oral chemotherapy with Tagrisso.  History of brain metastasis, with resection.  History  of dementia: Currently alert and oriented.  On Aricept from home.  History of DVT: Continue Xarelto.  Constipation:  Continue bowel regimen 09/12/2019: Repeat abdominal KUB is  nonrevealing.  DVT prophylaxis: Xarelto Code Status: Full Family Communication:  Disposition Plan: Skilled nursing facility  Consultants: Rehab team consulted (Deemed not a candidate for CIR).  Procedures: None  Antimicrobials:  Anti-infectives (From admission, onward)   Start     Dose/Rate Route Frequency Ordered Stop   09/10/19 2000  cefTRIAXone (ROCEPHIN) 1 g in sodium chloride 0.9 % 100 mL IVPB     1 g 200 mL/hr over 30 Minutes Intravenous Every 24 hours 09/10/19 1901     09/10/19 1745  cefTRIAXone (ROCEPHIN) 1 g in sodium chloride 0.9 % 100 mL IVPB  Status:  Discontinued     1 g 200 mL/hr over 30 Minutes Intravenous  Once 09/10/19 1732 09/10/19 1814      Subjective: Patient seen. No new complaints. No fever or chills. No chest pain. No shortness of breath.  Objective: Vitals:   09/12/19 0526 09/12/19 1510 09/12/19 2256 09/13/19 0632  BP: 137/63 119/74 121/77 126/69  Pulse: 76 100 90 93  Resp: 16  18 18   Temp: 98.1 F (36.7 C) 98 F (36.7 C) 98.4 F (36.9 C) 98.9 F (37.2 C)  TempSrc: Oral Oral Oral Oral  SpO2: 98% 99% 97% 96%  Weight:      Height:        Intake/Output Summary (Last 24 hours) at 09/13/2019 0936 Last data filed at 09/13/2019 0754 Gross per 24 hour  Intake 2438.48 ml  Output 1902 ml  Net 536.48 ml   Filed Weights   09/10/19 1835  Weight: 81.6 kg    Examination: General exam: Not in any distress.  Patient is awake and alert. Respiratory system: Clear to auscultation. Cardiovascular system: S1 & S2 heard Gastrointestinal system: Abdomen is nondistended, soft and nontender. Normal bowel sounds heard. Central nervous system: Awake and alert.  Patient moves all extremities.   Extremities: No leg edema.  Data Reviewed: I have personally reviewed following labs and imaging studies  CBC: Recent Labs  Lab 09/10/19 1155 09/11/19 0406 09/12/19 0436  WBC 5.3 3.6* 4.9  NEUTROABS 3.8  --  3.0  HGB 10.3* 8.7* 8.8*  HCT 34.7* 28.6* 29.7*   MCV 92.8 92.0 94.3  PLT 284 218 109   Basic Metabolic Panel: Recent Labs  Lab 09/10/19 1155 09/11/19 0406 09/12/19 0612  NA 142 142 139  K 3.3* 3.5 4.2  CL 104 110 107  CO2 27 23 25   GLUCOSE 110* 127* 95  BUN 41* 30* 15  CREATININE 1.84* 1.10* 0.79  CALCIUM 9.9 8.8* 9.0   GFR: Estimated Creatinine Clearance: 70.5 mL/min (by C-G formula based on SCr of 0.79 mg/dL). Liver Function Tests: Recent Labs  Lab 09/10/19 1155  AST 10*  ALT 8  ALKPHOS 65  BILITOT 0.6  PROT 6.8  ALBUMIN 3.4*   No results for input(s): LIPASE, AMYLASE in the last 168 hours. No results for input(s): AMMONIA in the last 168 hours. Coagulation Profile: No results for input(s): INR, PROTIME in the last 168 hours. Cardiac Enzymes: No results for input(s): CKTOTAL, CKMB, CKMBINDEX, TROPONINI in the last 168 hours. BNP (last 3 results) No results for input(s): PROBNP in the last 8760 hours. HbA1C: No results for input(s): HGBA1C in the last 72 hours. CBG: No results for input(s): GLUCAP in the last 168 hours. Lipid Profile: No results for input(s): CHOL, HDL,  LDLCALC, TRIG, CHOLHDL, LDLDIRECT in the last 72 hours. Thyroid Function Tests: No results for input(s): TSH, T4TOTAL, FREET4, T3FREE, THYROIDAB in the last 72 hours. Anemia Panel: No results for input(s): VITAMINB12, FOLATE, FERRITIN, TIBC, IRON, RETICCTPCT in the last 72 hours. Sepsis Labs: No results for input(s): PROCALCITON, LATICACIDVEN in the last 168 hours.  Recent Results (from the past 240 hour(s))  SARS CORONAVIRUS 2 (TAT 6-24 HRS) Nasopharyngeal Nasopharyngeal Swab     Status: None   Collection Time: 09/10/19  3:13 PM   Specimen: Nasopharyngeal Swab  Result Value Ref Range Status   SARS Coronavirus 2 NEGATIVE NEGATIVE Final    Comment: (NOTE) SARS-CoV-2 target nucleic acids are NOT DETECTED. The SARS-CoV-2 RNA is generally detectable in upper and lower respiratory specimens during the acute phase of infection. Negative  results do not preclude SARS-CoV-2 infection, do not rule out co-infections with other pathogens, and should not be used as the sole basis for treatment or other patient management decisions. Negative results must be combined with clinical observations, patient history, and epidemiological information. The expected result is Negative. Fact Sheet for Patients: SugarRoll.be Fact Sheet for Healthcare Providers: https://www.woods-mathews.com/ This test is not yet approved or cleared by the [place] FDA and  has been authorized for detection and/or diagnosis of SARS-CoV-2 by FDA under an Emergency Use Authorization (EUA). This EUA will remain  in effect (meaning this test can be used) for the duration of the COVID-19 declaration under Section 56 4(b)(1) of the Act, 21 U.S.C. section 360bbb-3(b)(1), unless the authorization is terminated or revoked sooner. Performed at [facility], [place] [address] [place], [place] [address]   Culture, Urine     Status: Abnormal (Preliminary result)   Collection Time: 09/11/19 12:00 AM   Specimen: Urine, Random  Result Value Ref Range Status   Specimen Description   Final    URINE, RANDOM Performed at [facility] [address] [place], [place] [address]    Special Requests   Final    NONE Performed at [facility], [place] [address], [place], [place] [address]    Culture (A)  Final    80,000 COLONIES/mL GRAM NEGATIVE RODS IDENTIFICATION AND SUSCEPTIBILITIES TO FOLLOW Performed at [facility], [place] [address], [place], [place] [address]    Report Status PENDING  Incomplete  Culture, blood (routine x 2)     Status: None (Preliminary result)   Collection Time: 09/11/19  8:23 AM   Specimen: BLOOD  Result Value Ref Range Status   Specimen Description   Final    BLOOD RIGHT ARM Performed at [facility] [address], [place],  [place] [address]    Special Requests   Final    BOTTLES DRAWN AEROBIC AND ANAEROBIC Blood Culture adequate volume Performed at [facility] [address], [place], [place] [address]    Culture   Final    NO GROWTH < 24 HOURS Performed at [facility] [address] [place], [place] [address]    Report Status PENDING  Incomplete  Culture, blood (routine x 2)     Status: None (Preliminary result)   Collection Time: 09/11/19  8:23 AM   Specimen: BLOOD  Result Value Ref Range Status   Specimen Description   Final    BLOOD LEFT HAND Performed at [facility] [address] [place], [place] [address]    Special Requests   Final  BOTTLES DRAWN AEROBIC ONLY Blood Culture adequate volume Performed at [facility] [address], [place], [place] [address]    Culture   Final    NO GROWTH < 24 HOURS Performed at [facility] [address], [place], [place] [address]    Report Status PENDING  Incomplete         Radiology Studies: DG Abd 1 View  Result Date: 09/12/2019 CLINICAL DATA:  Abdominal pain EXAM: ABDOMEN - 1 VIEW COMPARISON:  None. FINDINGS: Bowel gas pattern is nonobstructive. No evidence of soft tissue mass or abnormal fluid collection. No evidence of free intraperitoneal air seen, although a portion of the LEFT upper quadrant is excluded. No evidence of renal or ureteral calculi. Visualized osseous structures are unremarkable. IMPRESSION: No acute findings. Nonobstructive bowel gas pattern. Electronically Signed   By: Franki Cabot M.D.   On: 09/12/2019 15:12        Scheduled Meds: . vitamin C  250 mg Oral q1800  . Chlorhexidine Gluconate Cloth  6 each Topical Daily  . cholecalciferol  1,000 Units Oral QPM  . donepezil  10 mg Oral QPM  . multivitamin with minerals  1 tablet Oral q1800  . polyethylene glycol  17 g Oral Daily  . rivaroxaban  20 mg Oral QPM  . tamsulosin  0.4 mg Oral QPC supper    Continuous Infusions: . cefTRIAXone (ROCEPHIN)  IV 1 g (09/12/19 2050)     LOS: 3 days    Time spent: 25 mins.   Bonnell Public, MD [facility]  If 7PM-7AM, please contact night-coverage [email or web address] Password [facility] 09/13/2019, 9:36 AM   Addendum done on 09/13/2019 at 1658 hrs.: Urine culture grew E. coli, ESBL. We will change antibiotics to IV meropenem. Discussed with patient's husband, retired physician, over the phone and updated him. Patient's husband was in patient's room.  There is documentation that patient may have some dementia.

## 2019-09-13 NOTE — Plan of Care (Signed)

## 2019-09-14 LAB — SARS CORONAVIRUS 2 (TAT 6-24 HRS): SARS Coronavirus 2: NEGATIVE

## 2019-09-14 MED ORDER — SODIUM CHLORIDE 0.9% FLUSH
10.0000 mL | INTRAVENOUS | Status: DC | PRN
  Administered 2019-09-18: 10 mL

## 2019-09-14 MED ORDER — SODIUM CHLORIDE 0.9% FLUSH
10.0000 mL | Freq: Two times a day (BID) | INTRAVENOUS | Status: DC
  Administered 2019-09-14 – 2019-09-17 (×7): 10 mL

## 2019-09-14 MED FILL — TAGRISSO 80 MG TABLET: 80 | 30 days supply | Qty: 30 | Fill #1

## 2019-09-14 NOTE — Progress Notes (Signed)
Physical Therapy Treatment Patient Details Name: Valerie [name] MRN: [id number] DOB: [birth year] Today's Date: 09/14/2019    History of Present Illness Pt is a 74 y/o female admitted with c/o rectal pain.  Pt with hx of falling at home 06/10/19 in which she sustained a R distal femur fx and R tibial plateau fx. Pt is now s/p ORIF R LE and per pt has been advanced to WBAT R LE. PMH includes dementia, metastatic NSCL cancer and hx of resection of a brain tumor.    PT Comments    Pt agreeable to exercises in bed, declined EOB d/t pain   Follow Up Recommendations  SNF     Equipment Recommendations  None recommended by PT    Recommendations for Other Services       Precautions / Restrictions Precautions Precautions: Fall Precaution Comments: incontinence of bladder  Restrictions Weight Bearing Restrictions: No RLE Weight Bearing: Weight bearing as tolerated    Mobility  Bed Mobility                  Transfers                    Ambulation/Gait                 Stairs             Wheelchair Mobility    Modified Rankin (Stroke Patients Only)       Balance                                            Cognition Arousal/Alertness: Awake/alert Behavior During Therapy: WFL for tasks assessed/performed Overall Cognitive Status: Within Functional Limits for tasks assessed                                        Exercises General Exercises - Lower Extremity Ankle Circles/Pumps: AROM;Both;10 reps Quad Sets: AROM;10 reps;Both Gluteal Sets: AROM;Both;10 reps Heel Slides: Both;10 reps;AROM Hip ABduction/ADduction: AROM;Both;10 reps;AAROM Straight Leg Raises: AROM;AAROM;Both;10 reps    General Comments        Pertinent Vitals/Pain Pain Assessment: No/denies pain    Home Living                      Prior Function            PT Goals (current goals can now be found in the care plan  section) Acute Rehab PT Goals Patient Stated Goal: Regain IND PT Goal Formulation: With patient Time For Goal Achievement: 09/25/19 Potential to Achieve Goals: Fair Progress towards PT goals: Progressing toward goals    Frequency    Min 2X/week      PT Plan Discharge plan needs to be updated;Frequency needs to be updated    Co-evaluation              AM-PAC PT "6 Clicks" Mobility   Outcome Measure  Help needed turning from your back to your side while in a flat bed without using bedrails?: A Little Help needed moving from lying on your back to sitting on the side of a flat bed without using bedrails?: A Little Help needed moving to and from a bed to a chair (including a wheelchair)?: Total Help needed standing up from  a chair using your arms (e.g., wheelchair or bedside chair)?: A Lot Help needed to walk in hospital room?: Total Help needed climbing 3-5 steps with a railing? : Total 6 Click Score: 11    End of Session   Activity Tolerance: Patient tolerated treatment well Patient left: with call bell/phone within reach;in bed   PT Visit Diagnosis: Difficulty in walking, not elsewhere classified (R26.2);Muscle weakness (generalized) (M62.81)     Time: 1660-6301 PT Time Calculation (min) (ACUTE ONLY): 16 min  Charges:  $Therapeutic Exercise: 8-22 mins                     Baxter Flattery, PT   Acute Rehab Dept [facility]): [phone]   09/14/2019    [facility] 09/14/2019, 4:34 PM

## 2019-09-14 NOTE — NC FL2 (Addendum)
[place] LEVEL OF CARE SCREENING TOOL     IDENTIFICATION  Patient Name: Valerie [name] Birthdate: 1945-11-09 Sex: female Admission Date (Current Location): 09/10/2019  [facility] and [place] Number:  Herbalist and Address:  [facility],  [place], [place]      Provider Number: [id number]  Attending Physician Name and Address:  Bonnell Public, MD  Relative Name and Phone Number:     Cela, Newcom [phone]  [phone]       Current Level of Care: Hospital Recommended Level of Care: [place] Prior Approval Number:    Date Approved/Denied:   PASRR Number: [id number]  Discharge Plan: SNF    Current Diagnoses: Patient Active Problem List   Diagnosis Date Noted  . Pyelonephritis 09/10/2019  . Acute blood loss anemia 06/12/2019  . Closed fracture of distal end of right femur, initial encounter ([place]) 06/11/2019  . Closed fracture of lateral portion of right tibial plateau 06/11/2019  . History of DVT of lower extremity 06/11/2019  . Blurry vision 01/06/2019  . Headache 07/24/2016  . Cough 08/24/2015  . Chronic diarrhea 08/24/2015  . Sepsis ([place])   . Encounter for antineoplastic chemotherapy 01/31/2015  . Urinary retention 10/20/2014  . Ileus ([place]) 10/20/2014  . Cellulitis of leg, right 10/20/2014  . Fracture of right proximal fibula   . Ankle fracture 10/02/2014  . Ankle fracture 10/02/2014  . Lung cancer ([place]) 10/02/2014  . Closed fibular fracture 10/02/2014  . Dementia ([place]) 10/02/2014  . Lower urinary tract infectious disease 10/02/2014  . Reactive airway disease 10/02/2014  . Fall 10/02/2014  . Closed left ankle fracture 10/01/2014  . Right fibular fracture 10/01/2014  . Lung metastasis ([place])   . LESION, ANUS 04/26/2009  . DIARRHEA 04/26/2009  . Primary cancer of right upper lobe of lung ([place]) 04/22/2009  . Malignant neoplasm of brain ([place]) 04/22/2009  . VITAMIN D  DEFICIENCY 04/22/2009  . ANAL FISTULA 04/22/2009  . OSTEOPOROSIS 04/22/2009    Orientation RESPIRATION BLADDER Height & Weight     Self, Situation, Place, Time  Normal Continent Weight: 180 lb (81.6 kg) Height:  5\' 9"  (175.3 cm)  BEHAVIORAL SYMPTOMS/MOOD NEUROLOGICAL BOWEL NUTRITION STATUS      Continent Diet(Regular)  AMBULATORY STATUS COMMUNICATION OF NEEDS Skin   Extensive Assist Verbally Pressure Injury to heel, deep tissue                       Personal Care Assistance Level of Assistance  Bathing, Feeding, Dressing Bathing Assistance: Maximum assistance Feeding assistance: Independent Dressing Assistance: Maximum assistance     Functional Limitations Info  Sight, Hearing, Speech Sight Info: Adequate Hearing Info: Adequate Speech Info: Adequate    SPECIAL CARE FACTORS FREQUENCY  PT (By licensed PT), OT (By licensed OT)     PT Frequency: 5x/week OT Frequency: 5x/week            Contractures Contractures Info: Not present    Additional Factors Info  Code Status, Allergies, Psychotropic Code Status Info: Fullcode Allergies Info: Allergies: No Known Allergies Psychotropic Info: Aricept  ESBL contact precautions       Current Medications (09/14/2019):  This is the current hospital active medication list Current Facility-Administered Medications  Medication Dose Route Frequency Provider Last Rate Last Admin  . acetaminophen (TYLENOL) tablet 650 mg  650 mg Oral Q6H PRN Bodenheimer, Charles A, NP      . ascorbic acid (VITAMIN C) tablet  250 mg  250 mg Oral q1800 Debbe Odea, MD   250 mg at 09/13/19 1836  . Chlorhexidine Gluconate Cloth 2 % PADS 6 each  6 each Topical Daily Shelly Coss, MD   6 each at 09/13/19 1500  . cholecalciferol (VITAMIN D3) tablet 1,000 Units  1,000 Units Oral QPM Debbe Odea, MD   1,000 Units at 09/13/19 1836  . donepezil (ARICEPT) tablet 10 mg  10 mg Oral QPM Debbe Odea, MD   10 mg at 09/13/19 1836  .  HYDROcodone-acetaminophen (NORCO/VICODIN) 5-325 MG per tablet 1-2 tablet  1-2 tablet Oral Q6H PRN Vertis Kelch, NP   1 tablet at 09/12/19 1354  . meropenem (MERREM) 1 g in sodium chloride 0.9 % 100 mL IVPB  1 g Intravenous Q8H Dana Allan I, MD 200 mL/hr at 09/14/19 0138 1 g at 09/14/19 0138  . multivitamin with minerals tablet 1 tablet  1 tablet Oral R1540 Debbe Odea, MD   1 tablet at 09/13/19 1835  . polyethylene glycol (MIRALAX / GLYCOLAX) packet 17 g  17 g Oral Daily Shelly Coss, MD   17 g at 09/12/19 1131  . rivaroxaban (XARELTO) tablet 20 mg  20 mg Oral QPM Debbe Odea, MD   20 mg at 09/13/19 1836  . tamsulosin (FLOMAX) capsule 0.4 mg  0.4 mg Oral QPC supper Debbe Odea, MD   0.4 mg at 09/13/19 1836     Discharge Medications: Please see discharge summary for a list of discharge medications.  Relevant Imaging Results:  Relevant Lab Results:   Additional Information SSN: [id number]  Lia Hopping, LCSW

## 2019-09-14 NOTE — Progress Notes (Signed)
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  No report of fever or chills on presentation.  On presentation she was found to have acute kidney injury, urinary retention.  Foley was placed in the emergency department.  Started on antibiotic for urinary tract infection.  Admitted for the management of urinary tract infection, dehydration, AKI.  09/12/2019: Acute kidney injury has resolved.  Urine culture is growing gram-negative rods.  Final cultures are pending.  We will continue antibiotics for now.  Patient also had fecal impaction.  Patient has had an enema as per collateral information.  Repeat abdominal KUB reveals normal gas pattern.  Physical therapy input is appreciated, CIR is recommended.  Will consult rehab medicine team.  Updated patient's husband extensively.  All questions were answered.  09/13/2019: Patient seen.  Patient feels a lot better today.  Lose/watery stool reported today.  No fever or chills.  No abdominal pain.  Patient received recent enema.  Urine culture final result is still pending.  We will continue antibiotics for now.  Patient deemed not a candidate for CIR.  Patient is awaiting skilled nursing facility placement.  Hopefully, patient will be discharged once a bed is available.  Social work input is appreciated.  09/14/2019: Patient seen.  No new changes.  Patient has remained stable.  Discussed with the infectious disease team over the phone, Dr. Karolee Ohs, patient will complete 10-day course of IV meropenem.  Midline ordered.  Hopefully, patient will be  discharged tomorrow to skilled nursing facility.  Assessment & Plan:   Principal Problem:   Pyelonephritis Active Problems:   Primary cancer of right upper lobe of lung (HCC)   Urinary retention   Suspected pyonephritis/urinary tract infection: Presented with dysuria.  Has history of frequent UTI.  UA was grossly suggestive of urinary tract infection. CT abdomen/pelvis showed small left renal calculi but no obstructing ureteral calculi or bladder calculi,mild mucosal thickening of the left renal collecting system and possible areas of ureteral wall.Suspected  Pyonephrosis. Continue ceftriaxone for now.  Urine culture, blood culture should be followed.  Currently she is hemodynamically stable, afebrile.  No leukocytosis 09/13/2019: Follow final cultures.  Continue antibiotics for now. 09/14/2019: Urine culture grew E. coli, ESBL positive.  Patient is currently on IV meropenem.  IV meropenem was started yesterday.  Patient will complete 10-day course of IV meropenem.  Midline will be placed.  Urinary retention: Foley was placed in the emergency department.  The bladder was not significantly distended as per CT scan so we will give her a voiding trial.  Start on tamsulosin.  Might need to reinsert the Foley if she retains.  She has history of urinary retention in the past  09/12/2019: Follow with urology on discharge.  Patient is known to the urology team.  Complete treatment for UTI.   09/14/2019: Follow-up with urology on discharge.  AKI: Baseline creatinine normal.  Kidney function has almost returned to baseline.  Continue gentle IV fluids for today.  Consider stopping fluids tomorrow. 09/12/2019: Resolved.  Debility/deconditioning: She is mostly bedbound.  She had a fracture of hip in October last year.  History of  frequent falls .CT imaging showed new T10 compression fracture since February 2019,remote compression fractures of L1 and L4.  She does not complain of severe back pain so kyphoplasty  is not necessary at this point.  PT/OT requested. 09/12/2019: For SNF.  Normocytic anemia: Most likely associated  with anemia of chronic disease.  Continue to monitor CBC  Non-small cell right lung cancer: Currently in remission.  Status post lobectomy.  Follows with Dr. Julien Nordmann.  On oral chemotherapy with Tagrisso.  History of brain metastasis, with resection.  History of dementia: Currently alert and oriented.  On Aricept from home.  History of DVT: Continue Xarelto.  Constipation:  Continue bowel regimen 09/12/2019: Repeat abdominal KUB is nonrevealing.  DVT prophylaxis: Xarelto Code Status: Full Family Communication:  Disposition Plan: Skilled nursing facility  Consultants: Rehab team consulted (Deemed not a candidate for CIR).  Procedures: None  Antimicrobials:  Anti-infectives (From admission, onward)   Start     Dose/Rate Route Frequency Ordered Stop   09/13/19 1800  meropenem (MERREM) 1 g in sodium chloride 0.9 % 100 mL IVPB     1 g 200 mL/hr over 30 Minutes Intravenous Every 8 hours 09/13/19 1652     09/13/19 1700  meropenem (MERREM) 500 mg in sodium chloride 0.9 % 100 mL IVPB  Status:  Discontinued     500 mg 200 mL/hr over 30 Minutes Intravenous Every 8 hours 09/13/19 1651 09/13/19 1652   09/10/19 2000  cefTRIAXone (ROCEPHIN) 1 g in sodium chloride 0.9 % 100 mL IVPB  Status:  Discontinued     1 g 200 mL/hr over 30 Minutes Intravenous Every 24 hours 09/10/19 1901 09/13/19 1651   09/10/19 1745  cefTRIAXone (ROCEPHIN) 1 g in sodium chloride 0.9 % 100 mL IVPB  Status:  Discontinued     1 g 200 mL/hr over 30 Minutes Intravenous  Once 09/10/19 1732 09/10/19 1814      Subjective: Patient seen. No new complaints. No fever or chills.  Objective: Vitals:   09/13/19 0632 09/13/19 1343 09/13/19 2133 09/14/19 0539  BP: 126/69 137/66 130/67 122/69  Pulse: 93 94 84 87  Resp: 18 16 16 18   Temp: 98.9 F (37.2 C) 98.4 F (36.9 C) 99.2 F (37.3 C) 98.5 F (36.9 C)   TempSrc: Oral Oral Oral Oral  SpO2: 96% 97% 95% 98%  Weight:      Height:        Intake/Output Summary (Last 24 hours) at 09/14/2019 1139 Last data filed at 09/14/2019 0815 Gross per 24 hour  Intake 880 ml  Output 800 ml  Net 80 ml   Filed Weights   09/10/19 1835  Weight: 81.6 kg    Examination: General exam: Not in any distress.  Patient is awake and alert. Respiratory system: Clear to auscultation. Cardiovascular system: S1 & S2 heard Gastrointestinal system: Abdomen is nondistended, soft and nontender. Normal bowel sounds heard. Central nervous system: Awake and alert.  Patient moves all extremities.   Extremities: No leg edema.  Data Reviewed: I have personally reviewed following labs and imaging studies  CBC: Recent Labs  Lab 09/10/19 1155 09/11/19 0406 09/12/19 0436  WBC 5.3 3.6* 4.9  NEUTROABS 3.8  --  3.0  HGB 10.3* 8.7* 8.8*  HCT 34.7* 28.6* 29.7*  MCV 92.8 92.0 94.3  PLT 284 218 700   Basic Metabolic Panel: Recent Labs  Lab 09/10/19 1155 09/11/19 0406 09/12/19 0612  NA 142 142 139  K 3.3* 3.5 4.2  CL 104 110 107  CO2 27 23 25   GLUCOSE 110* 127* 95  BUN 41* 30* 15  CREATININE 1.84* 1.10* 0.79  CALCIUM 9.9 8.8* 9.0   GFR: Estimated Creatinine Clearance: 70.5 mL/min (by C-G formula based on SCr of 0.79 mg/dL). Liver Function Tests: Recent Labs  Lab 09/10/19 1155  AST 10*  ALT 8  ALKPHOS 65  BILITOT 0.6  PROT 6.8  ALBUMIN 3.4*   No results for input(s): LIPASE, AMYLASE in the last 168 hours. No results for input(s): AMMONIA in the last 168 hours. Coagulation Profile: No results for input(s): INR, PROTIME in the last 168 hours. Cardiac Enzymes: No results for input(s): CKTOTAL, CKMB, CKMBINDEX, TROPONINI in the last 168 hours. BNP (last 3 results) No results for input(s): PROBNP in the last 8760 hours. HbA1C: No results for input(s): HGBA1C in the last 72 hours. CBG: No results for input(s): GLUCAP in the last 168 hours. Lipid  Profile: No results for input(s): CHOL, HDL, LDLCALC, TRIG, CHOLHDL, LDLDIRECT in the last 72 hours. Thyroid Function Tests: No results for input(s): TSH, T4TOTAL, FREET4, T3FREE, THYROIDAB in the last 72 hours. Anemia Panel: No results for input(s): VITAMINB12, FOLATE, FERRITIN, TIBC, IRON, RETICCTPCT in the last 72 hours. Sepsis Labs: No results for input(s): PROCALCITON, LATICACIDVEN in the last 168 hours.  Recent Results (from the past 240 hour(s))  SARS CORONAVIRUS 2 (TAT 6-24 HRS) Nasopharyngeal Nasopharyngeal Swab     Status: None   Collection Time: 09/10/19  3:13 PM   Specimen: Nasopharyngeal Swab  Result Value Ref Range Status   SARS Coronavirus 2 NEGATIVE NEGATIVE Final    Comment: (NOTE) SARS-CoV-2 target nucleic acids are NOT DETECTED. The SARS-CoV-2 RNA is generally detectable in upper and lower respiratory specimens during the acute phase of infection. Negative results do not preclude SARS-CoV-2 infection, do not rule out co-infections with other pathogens, and should not be used as the sole basis for treatment or other patient management decisions. Negative results must be combined with clinical observations, patient history, and epidemiological information. The expected result is Negative. Fact Sheet for Patients: SugarRoll.be Fact Sheet for Healthcare Providers: https://www.woods-mathews.com/ This test is not yet approved or cleared by the [place] FDA and  has been authorized for detection and/or diagnosis of SARS-CoV-2 by FDA under an Emergency Use Authorization (EUA). This EUA will remain  in effect (meaning this test can be used) for the duration of the COVID-19 declaration under Section 56 4(b)(1) of the Act, 21 U.S.C. section 360bbb-3(b)(1), unless the authorization is terminated or revoked sooner. Performed at [facility], [place] [address], [place], [place] [address]   Culture, Urine     Status: Abnormal    Collection Time: 09/11/19 12:00 AM   Specimen: Urine, Random  Result Value Ref Range Status   Specimen Description   Final    URINE, RANDOM Performed at [facility] [address] [place], [place] [address]    Special Requests   Final    NONE Performed at [facility], [place] [address], [place], [place] [address]    Culture (A)  Final    80,000 COLONIES/mL ESCHERICHIA COLI Confirmed Extended Spectrum Beta-Lactamase Producer (ESBL).  In bloodstream infections from ESBL organisms, carbapenems are preferred over piperacillin/tazobactam. They are shown to have a lower risk of mortality.    Report Status 09/13/2019 FINAL  Final   Organism ID, Bacteria ESCHERICHIA COLI (A)  Final      Susceptibility   Escherichia coli - MIC*  AMPICILLIN >=32 RESISTANT Resistant     CEFAZOLIN <=4 SENSITIVE Sensitive     CEFTRIAXONE 1 SENSITIVE Sensitive     CIPROFLOXACIN <=0.25 SENSITIVE Sensitive     GENTAMICIN <=1 SENSITIVE Sensitive     IMIPENEM <=0.25 SENSITIVE Sensitive     NITROFURANTOIN 32 SENSITIVE Sensitive     TRIMETH/SULFA <=20 SENSITIVE Sensitive     AMPICILLIN/SULBACTAM 16 INTERMEDIATE Intermediate     PIP/TAZO 8 SENSITIVE Sensitive     * 80,000 COLONIES/mL ESCHERICHIA COLI  Culture, blood (routine x 2)     Status: None (Preliminary result)   Collection Time: 09/11/19  8:23 AM   Specimen: BLOOD  Result Value Ref Range Status   Specimen Description   Final    BLOOD RIGHT ARM Performed at [facility] [address] [place], [place] [address]    Special Requests   Final    BOTTLES DRAWN AEROBIC AND ANAEROBIC Blood Culture adequate volume Performed at [facility] [address] [place], [place] [address]    Culture   Final    NO GROWTH 3 DAYS Performed at [facility], [place] [address], [place], [place] [address]    Report Status PENDING  Incomplete  Culture, blood (routine x 2)     Status: None  (Preliminary result)   Collection Time: 09/11/19  8:23 AM   Specimen: BLOOD  Result Value Ref Range Status   Specimen Description   Final    BLOOD LEFT HAND Performed at [facility] [address], [place], [place] [address]    Special Requests   Final    BOTTLES DRAWN AEROBIC ONLY Blood Culture adequate volume Performed at [facility] [address], [place], [place] [address]    Culture   Final    NO GROWTH 3 DAYS Performed at [facility], [place] [address], [place], [place] [address]    Report Status PENDING  Incomplete         Radiology Studies: DG Abd 1 View  Result Date: 09/12/2019 CLINICAL DATA:  Abdominal pain EXAM: ABDOMEN - 1 VIEW COMPARISON:  None. FINDINGS: Bowel gas pattern is nonobstructive. No evidence of soft tissue mass or abnormal fluid collection. No evidence of free intraperitoneal air seen, although a portion of the LEFT upper quadrant is excluded. No evidence of renal or ureteral calculi. Visualized osseous structures are unremarkable. IMPRESSION: No acute findings. Nonobstructive bowel gas pattern. Electronically Signed   By: Franki Cabot M.D.   On: 09/12/2019 15:12        Scheduled Meds: . vitamin C  250 mg Oral q1800  . Chlorhexidine Gluconate Cloth  6 each Topical Daily  . cholecalciferol  1,000 Units Oral QPM  . donepezil  10 mg Oral QPM  . multivitamin with minerals  1 tablet Oral q1800  . polyethylene glycol  17 g Oral Daily  . rivaroxaban  20 mg Oral QPM  . sodium chloride flush  10-40 mL Intracatheter Q12H  . tamsulosin  0.4 mg Oral QPC supper   Continuous Infusions: . meropenem (MERREM) IV 1 g (09/14/19 1006)     LOS: 4 days    Time spent: 25 mins.   Bonnell Public, MD [facility]  If 7PM-7AM, please contact night-coverage [email or web address] Password [facility] 09/14/2019, 11:39 AM   Addendum done on 09/13/2019 at 1658 hrs.: Urine culture grew E. coli, ESBL. We will change  antibiotics to IV meropenem. Discussed with patient's husband, retired Engineer, drilling, over  the phone and updated him. Patient's husband was in patient's room.  There is documentation that patient may have some dementia.

## 2019-09-14 NOTE — Care Management Important Message (Signed)
Important Message  Patient Details IM Letter given to [place] Case Manager to present to the Patient Name: Valerie [name] MRN: [id number] Date of Birth: [birth year]   Medicare Important Message Given:  Yes     Kerin Salen 09/14/2019, 12:25 PM

## 2019-09-14 NOTE — TOC Progression Note (Signed)
Transition of Care [facility]) - Progression Note    Patient Details  Name: Valerie [name] MRN: [id number] Date of Birth: June 13, 1946  Transition of Care [facility]) CM/SW [place], [place] Phone Number: 09/14/2019, 10:52 AM  Clinical Narrative:    CSW reached out to the patient, confirm preference for [facility].CSW confirm bed availability at SNF.  Covid test pending transfer tomorrow.  Lexicographer [phone]   Expected Discharge Plan: Skilled Nursing Facility(patient would like to go into rehab following her hospitalization) Barriers to Discharge: Other (comment)(Covid test pending)  Expected Discharge Plan and Services Expected Discharge Plan: Skilled Nursing Facility(patient would like to go into rehab following her hospitalization) In-house Referral: Clinical Social Work   Post Acute Care Choice: Skilled Nursing Facility(patient would like to go to a rehab following her hospitalization, preferably [facility]) Living arrangements for the past 2 months: [place]                                       Social Determinants of Health (SDOH) Interventions    Readmission Risk Interventions No flowsheet data found.

## 2019-09-15 DIAGNOSIS — N12 Tubulo-interstitial nephritis, not specified as acute or chronic: Secondary | ICD-10-CM

## 2019-09-15 NOTE — Progress Notes (Signed)
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  No report of fever or chills on presentation.  On presentation she was found to have acute kidney injury, urinary retention.  Foley was placed in the emergency department.  Started on antibiotic for urinary tract infection.  Admitted for the management of urinary tract infection, dehydration, AKI.  Hospital course 09/12/2019: Acute kidney injury has resolved.  Urine culture is growing gram-negative rods.  Final cultures are pending.  We will continue antibiotics for now.  Patient also had fecal impaction.  Patient has had an enema as per collateral information.  Repeat abdominal KUB reveals normal gas pattern.  Physical therapy input is appreciated, CIR is recommended.  Will consult rehab medicine team.  Updated patient's husband extensively.  All questions were answered.  09/13/2019: Patient seen.  Patient feels a lot better today.  Lose/watery stool reported today.  No fever or chills.  No abdominal pain.  Patient received recent enema.  Urine culture final result is still pending.  We will continue antibiotics for now.  Patient deemed not a candidate for CIR.  Patient is awaiting skilled nursing facility placement.  Hopefully, patient will be discharged once a bed is available.  Social work input is appreciated.  09/14/2019: Patient seen.  No new changes.  Patient has remained stable.  Discussed with the infectious disease team over the phone, Dr. Karolee Ohs, patient will complete 10-day course of IV meropenem.  Midline ordered.  Hopefully,  patient will be discharged tomorrow to skilled nursing facility. 09/15/2019.  Discussed with case worker and both facilities are refusing to accept patient due to being on meropenem, which according to them is expensive to procure Facility will also prepare PICC line for long-term antibiotic therapy rather than midline. [facility] working on alternative placement arrangement.  Assessment & Plan:   Principal Problem:   Pyelonephritis Active Problems:   Primary cancer of right upper lobe of lung ([place])   Urinary retention   1.  Suspected pyonephritis/urinary tract infection: Presented with dysuria.  Has history of frequent UTI.  UA was grossly suggestive of urinary tract infection. CT abdomen/pelvis showed small left renal calculi but no obstructing ureteral calculi or bladder calculi,mild mucosal thickening of the left renal collecting system and possible areas of ureteral wall.Suspected  Pyonephrosis. Continue ceftriaxone for now.  Urine culture, blood culture should be followed.  Currently she is hemodynamically stable, afebrile.  No leukocytosis 09/13/2019: Follow final cultures.  Continue antibiotics for now. 09/14/2019: Urine culture grew E. coli, ESBL positive.  Patient is currently on IV meropenem.  IV meropenem was started on 09/13/2019.[name]   Patient will need to complete 10-day course of IV meropenem, EOT with end of treatment on 09/22/2019 with End of treatment on 09/22/19 PICC line will be placed   Urinary retention: Foley was placed in the emergency department.  The bladder was not significantly distended as per CT scan so we will give her a voiding trial.  Start on tamsulosin.  Might need to reinsert the Foley if she retains.  She has history of urinary retention in the past  09/12/2019: Follow with urology on discharge.  Patient is known to the urology team.  Complete treatment for UTI.    Follow-up with urology on discharge.  AKI: Baseline creatinine normal.  Kidney function has almost returned  to baseline.  Continue gentle IV fluids for today.  Consider stopping fluids tomorrow. 09/12/2019: Resolved.  Debility/deconditioning: She is mostly bedbound.  She had a fracture of hip in October last year.  History of frequent falls .CT imaging showed new T10 compression fracture since February 2019,remote compression fractures of L1 and L4.  She does not complain of severe back pain so kyphoplasty is not necessary at this point.  PT/OT requested. 09/12/2019: For SNF.  Normocytic anemia: Most likely associated  with anemia of chronic disease.  Continue to monitor CBC  Non-small cell right lung cancer: Currently in remission.  Status post lobectomy.  Follows with Dr. Julien Nordmann.  On oral chemotherapy with Tagrisso.  History of brain metastasis, with resection.  History of dementia: Currently alert and oriented.  On Aricept from home.  History of DVT: Continue Xarelto.  Constipation:  Continue bowel regimen 09/12/2019: Repeat abdominal KUB is nonrevealing.  DVT prophylaxis: Xarelto Code Status: Full Family Communication: None at bedside Disposition Plan: Patient will be discharged to skilled nursing facility Barrier to discharge include currently on IV meropenem which appears expensive for nursing facility and current diagnosis of ESBL with contact precaution.  Consultants: Rehab team consulted (Deemed not a candidate for CIR).  Procedures: None  Antimicrobials:  Anti-infectives (From admission, onward)   Start     Dose/Rate Route Frequency Ordered Stop   09/13/19 1800  meropenem (MERREM) 1 g in sodium chloride 0.9 % 100 mL IVPB     1 g 200 mL/hr over 30 Minutes Intravenous Every 8 hours 09/13/19 1652     09/13/19 1700  meropenem (MERREM) 500 mg in sodium chloride 0.9 % 100 mL IVPB  Status:  Discontinued     500 mg 200 mL/hr over 30 Minutes Intravenous Every 8 hours 09/13/19 1651 09/13/19 1652   09/10/19 2000  cefTRIAXone (ROCEPHIN) 1 g in sodium chloride 0.9 % 100 mL IVPB  Status:   Discontinued     1 g 200 mL/hr over 30 Minutes Intravenous Every 24 hours 09/10/19 1901 09/13/19 1651   09/10/19 1745  cefTRIAXone (ROCEPHIN) 1 g in sodium chloride 0.9 % 100 mL IVPB  Status:  Discontinued     1 g 200 mL/hr over 30 Minutes Intravenous  Once 09/10/19 1732 09/10/19 1814      Subjective: Patient was seen and examined at bedside.  Denies acute distress.  Alert and oriented.  No acute overnight event was reported. Patient awaiting for discharge to SNF   Objective: Vitals:   09/14/19 1439 09/14/19 2233 09/15/19 0545 09/15/19 1413  BP: 109/66 118/61 131/62 122/66  Pulse: (!) 102 95 89 82  Resp: 15 18 14 18   Temp: 99.1 F (37.3 C) 99.2 F (37.3 C) (!) 97.5 F (36.4 C) 97.9 F (36.6 C)  TempSrc: Oral Oral Oral Oral  SpO2: 96% 95% 97% 99%  Weight:      Height:        Intake/Output Summary (Last 24 hours) at 09/15/2019 1627 Last data filed at 09/15/2019 1400 Gross per 24 hour  Intake 860 ml  Output 900 ml  Net -40 ml   Filed Weights   09/10/19 1835  Weight: 81.6 kg    Examination: General exam: Not in any distress.  Patient is awake and alert. Respiratory system: Clear to auscultation. Cardiovascular system: S1 &  S2 heard Gastrointestinal system: Abdomen is nondistended, soft and nontender. Normal bowel sounds heard. Central nervous system: Awake and alert.  Patient moves all extremities.   Extremities: No leg edema.  Data Reviewed: I have personally reviewed following labs and imaging studies  CBC: Recent Labs  Lab 09/10/19 1155 09/11/19 0406 09/12/19 0436  WBC 5.3 3.6* 4.9  NEUTROABS 3.8  --  3.0  HGB 10.3* 8.7* 8.8*  HCT 34.7* 28.6* 29.7*  MCV 92.8 92.0 94.3  PLT 284 218 324   Basic Metabolic Panel: Recent Labs  Lab 09/10/19 1155 09/11/19 0406 09/12/19 0612  NA 142 142 139  K 3.3* 3.5 4.2  CL 104 110 107  CO2 27 23 25   GLUCOSE 110* 127* 95  BUN 41* 30* 15  CREATININE 1.84* 1.10* 0.79  CALCIUM 9.9 8.8* 9.0   GFR: Estimated  Creatinine Clearance: 70.5 mL/min (by C-G formula based on SCr of 0.79 mg/dL). Liver Function Tests: Recent Labs  Lab 09/10/19 1155  AST 10*  ALT 8  ALKPHOS 65  BILITOT 0.6  PROT 6.8  ALBUMIN 3.4*   No results for input(s): LIPASE, AMYLASE in the last 168 hours. No results for input(s): AMMONIA in the last 168 hours. Coagulation Profile: No results for input(s): INR, PROTIME in the last 168 hours. Cardiac Enzymes: No results for input(s): CKTOTAL, CKMB, CKMBINDEX, TROPONINI in the last 168 hours. BNP (last 3 results) No results for input(s): PROBNP in the last 8760 hours. HbA1C: No results for input(s): HGBA1C in the last 72 hours. CBG: No results for input(s): GLUCAP in the last 168 hours. Lipid Profile: No results for input(s): CHOL, HDL, LDLCALC, TRIG, CHOLHDL, LDLDIRECT in the last 72 hours. Thyroid Function Tests: No results for input(s): TSH, T4TOTAL, FREET4, T3FREE, THYROIDAB in the last 72 hours. Anemia Panel: No results for input(s): VITAMINB12, FOLATE, FERRITIN, TIBC, IRON, RETICCTPCT in the last 72 hours. Sepsis Labs: No results for input(s): PROCALCITON, LATICACIDVEN in the last 168 hours.  Recent Results (from the past 240 hour(s))  SARS CORONAVIRUS 2 (TAT 6-24 HRS) Nasopharyngeal Nasopharyngeal Swab     Status: None   Collection Time: 09/10/19  3:13 PM   Specimen: Nasopharyngeal Swab  Result Value Ref Range Status   SARS Coronavirus 2 NEGATIVE NEGATIVE Final    Comment: (NOTE) SARS-CoV-2 target nucleic acids are NOT DETECTED. The SARS-CoV-2 RNA is generally detectable in upper and lower respiratory specimens during the acute phase of infection. Negative results do not preclude SARS-CoV-2 infection, do not rule out co-infections with other pathogens, and should not be used as the sole basis for treatment or other patient management decisions. Negative results must be combined with clinical observations, patient history, and epidemiological information. The  expected result is Negative. Fact Sheet for Patients: SugarRoll.be Fact Sheet for Healthcare Providers: https://www.woods-mathews.com/ This test is not yet approved or cleared by the [place] FDA and  has been authorized for detection and/or diagnosis of SARS-CoV-2 by FDA under an Emergency Use Authorization (EUA). This EUA will remain  in effect (meaning this test can be used) for the duration of the COVID-19 declaration under Section 56 4(b)(1) of the Act, 21 U.S.C. section 360bbb-3(b)(1), unless the authorization is terminated or revoked sooner. Performed at [facility], [place] [address] [place], [place] [address]   Culture, Urine     Status: Abnormal   Collection Time: 09/11/19 12:00 AM   Specimen: Urine, Random  Result Value Ref Range Status   Specimen Description   Final  URINE, RANDOM Performed at [facility], [place] [address], [place], [place] [address]    Special Requests   Final    NONE Performed at [facility], [place] [address] [place], [place] [address]    Culture (A)  Final    80,000 COLONIES/mL ESCHERICHIA COLI Confirmed Extended Spectrum Beta-Lactamase Producer (ESBL).  In bloodstream infections from ESBL organisms, carbapenems are preferred over piperacillin/tazobactam. They are shown to have a lower risk of mortality.    Report Status 09/13/2019 FINAL  Final   Organism ID, Bacteria ESCHERICHIA COLI (A)  Final      Susceptibility   Escherichia coli - MIC*    AMPICILLIN >=32 RESISTANT Resistant     CEFAZOLIN <=4 SENSITIVE Sensitive     CEFTRIAXONE 1 SENSITIVE Sensitive     CIPROFLOXACIN <=0.25 SENSITIVE Sensitive     GENTAMICIN <=1 SENSITIVE Sensitive     IMIPENEM <=0.25 SENSITIVE Sensitive     NITROFURANTOIN 32 SENSITIVE Sensitive     TRIMETH/SULFA <=20 SENSITIVE Sensitive     AMPICILLIN/SULBACTAM 16 INTERMEDIATE Intermediate     PIP/TAZO 8 SENSITIVE Sensitive     *  80,000 COLONIES/mL ESCHERICHIA COLI  Culture, blood (routine x 2)     Status: None (Preliminary result)   Collection Time: 09/11/19  8:23 AM   Specimen: BLOOD  Result Value Ref Range Status   Specimen Description   Final    BLOOD RIGHT ARM Performed at [facility] [address] [place], [place] [address]    Special Requests   Final    BOTTLES DRAWN AEROBIC AND ANAEROBIC Blood Culture adequate volume Performed at [place] [facility] [address] [place], [place] [address]    Culture   Final    NO GROWTH 4 DAYS Performed at [facility], [place] [address], [place], [place] [address]    Report Status PENDING  Incomplete  Culture, blood (routine x 2)     Status: None (Preliminary result)   Collection Time: 09/11/19  8:23 AM   Specimen: BLOOD  Result Value Ref Range Status   Specimen Description   Final    BLOOD LEFT HAND Performed at [place] [address] [place], [place] [address]    Special Requests   Final    BOTTLES DRAWN AEROBIC ONLY Blood Culture adequate volume Performed at [facility] [address], [place], [place] [address]    Culture   Final    NO GROWTH 4 DAYS Performed at [facility], [place] [address], [place], [place] [address]    Report Status PENDING  Incomplete  SARS CORONAVIRUS 2 (TAT 6-24 HRS) Nasopharyngeal Nasopharyngeal Swab     Status: None   Collection Time: 09/14/19  9:25 AM   Specimen: Nasopharyngeal Swab  Result Value Ref Range Status   SARS Coronavirus 2 NEGATIVE NEGATIVE Final    Comment: (NOTE) SARS-CoV-2 target nucleic acids are NOT DETECTED. The SARS-CoV-2 RNA is generally detectable in upper and lower respiratory specimens during the acute phase of infection. Negative results do not preclude SARS-CoV-2 infection, do not rule out co-infections with other pathogens, and should not be used as the sole basis for treatment or other patient management  decisions. Negative results must be combined with clinical observations, patient history, and epidemiological information. The expected result is Negative. Fact Sheet for Patients: SugarRoll.be Fact Sheet for Healthcare Providers: https://www.woods-mathews.com/ This test is not yet approved or cleared by the [place] FDA  and  has been authorized for detection and/or diagnosis of SARS-CoV-2 by FDA under an Emergency Use Authorization (EUA). This EUA will remain  in effect (meaning this test can be used) for the duration of the COVID-19 declaration under Section 56 4(b)(1) of the Act, 21 U.S.C. section 360bbb-3(b)(1), unless the authorization is terminated or revoked sooner. Performed at [facility], [place] [address], [place], [place] [address]          Radiology Studies: No results found.      Scheduled Meds: . vitamin C  250 mg Oral q1800  . Chlorhexidine Gluconate Cloth  6 each Topical Daily  . cholecalciferol  1,000 Units Oral QPM  . donepezil  10 mg Oral QPM  . multivitamin with minerals  1 tablet Oral q1800  . polyethylene glycol  17 g Oral Daily  . rivaroxaban  20 mg Oral QPM  . sodium chloride flush  10-40 mL Intracatheter Q12H  . tamsulosin  0.4 mg Oral QPC supper   Continuous Infusions: . meropenem (MERREM) IV 1 g (09/15/19 0848)     LOS: 5 days    Time spent: 35 mins.   Elie Confer, MD [facility] Pager: [phone]  If 7PM-7AM, please contact night-coverage [email or web address] Password [facility] 09/15/2019, 4:27 PM

## 2019-09-15 NOTE — TOC Progression Note (Addendum)
Transition of Care [facility]) - Progression Note    Patient Details  Name: Valerie [name] MRN: [id number] Date of Birth: November 06, 1945  [facility] CM/SW [place], LCSW Phone Number: 09/15/2019, 10:07 AM  Clinical Narrative:    Per SNF, 10 day course of IV meropenem is too costly and cannot accept the patient. CSW notified the physician.   SNF's will not accept a midline. They will only take PICC line. Physician notified.   CSW received an update from [facility]. stating they retracted the bed offer because the facility "cannot guarantee the patient will continue to have a private room after 14 days." The facility reports the patient will need to stay on isolation. CSW notified the patient at bedside. CSW actively searching for a different SNF. Physician notified.   Expected Discharge Plan: Skilled Nursing Facility(patient would like to go into rehab following her hospitalization) Barriers to Discharge: Other (comment)(IV medication too expensive for SNF)  Expected Discharge Plan and Services Expected Discharge Plan: Skilled Nursing Facility(patient would like to go into rehab following her hospitalization) In-house Referral: Clinical Social Work   Post Acute Care Choice: Skilled Nursing Facility(patient would like to go to a rehab following her hospitalization, preferably [facility]) Living arrangements for the past 2 months: [place]                                       Social Determinants of Health (SDOH) Interventions    Readmission Risk Interventions No flowsheet data found.

## 2019-09-15 NOTE — Progress Notes (Signed)
Occupational Therapy Treatment Patient Details Name: Valerie [name] MRN: [id number] DOB: [birth year] Today's Date: 09/15/2019    History of present illness Pt is a 74 y/o female admitted with c/o rectal pain.  Pt with hx of falling at home 06/10/19 in which she sustained a R distal femur fx and R tibial plateau fx. Pt is now s/p ORIF R LE and per pt has been advanced to WBAT R LE. PMH includes dementia, metastatic NSCL cancer and hx of resection of a brain tumor.   OT comments  Performed adl from eob and supine. Pt needed one hand support to maintain sitting balance.  Participated well   Follow Up Recommendations  SNF    Equipment Recommendations  None recommended by OT    Recommendations for Other Services      Precautions / Restrictions Precautions Precautions: Fall Precaution Comments: incontinence of bladder  Restrictions RLE Weight Bearing: Weight bearing as tolerated LLE Weight Bearing: Weight bearing as tolerated       Mobility Bed Mobility      rolling:  Min A to L; mod to R   Supine to sit: Min assist;Mod assist Sit to supine: Min assist   General bed mobility comments: extra time and effort  Transfers                      Balance       Sitting balance - Comments: needed at least one arm to maintain support                                   ADL either performed or assessed with clinical judgement   ADL       Grooming: Wash/dry face;Set up;Sitting;Oral care   Upper Body Bathing: Set up   Lower Body Bathing: Maximal assistance   Upper Body Dressing : Minimal assistance   Lower Body Dressing: Total assistance       Toileting- Clothing Manipulation and Hygiene: Moderate assistance;Bed level(pt managed front peri area)         General ADL Comments: performed adl from EOB to supine for peri area. Assisted with changing gown, socks, and depends type of garment     Vision       Perception     Praxis       Cognition Arousal/Alertness: Awake/alert Behavior During Therapy: WFL for tasks assessed/performed Overall Cognitive Status: Within Functional Limits for tasks assessed                                          Exercises     Shoulder Instructions       General Comments      Pertinent Vitals/ Pain       Pain Assessment: Faces Faces Pain Scale: Hurts little more Pain Location: RLE Pain Descriptors / Indicators: Aching Pain Intervention(s): Limited activity within patient's tolerance;Monitored during session;Repositioned  Home Living                                          Prior Functioning/Environment              Frequency  Min 2X/week        Progress Toward Goals  OT Goals(current goals can now be found in the care plan section)  Progress towards OT goals: Progressing toward goals(slowly)     Plan      Co-evaluation                 AM-PAC OT "6 Clicks" Daily Activity     Outcome Measure   Help from another person eating meals?: A Little Help from another person taking care of personal grooming?: A Little Help from another person toileting, which includes using toliet, bedpan, or urinal?: A Lot Help from another person bathing (including washing, rinsing, drying)?: A Lot Help from another person to put on and taking off regular upper body clothing?: A Little Help from another person to put on and taking off regular lower body clothing?: Total 6 Click Score: 14    End of Session    OT Visit Diagnosis: Unsteadiness on feet (R26.81);Other abnormalities of gait and mobility (R26.89);Muscle weakness (generalized) (M62.81);History of falling (Z91.81)   Activity Tolerance Patient limited by fatigue   Patient Left in bed;with call bell/phone within reach;with bed alarm set   Nurse Communication          Time: 1157-2620 OT Time Calculation (min): 38 min  Charges: OT General Charges $OT Visit: 1 Visit OT  Treatments $Self Care/Home Management : 23-37 mins  [place], OTR/L Acute Rehabilitation Services 09/15/2019   [place] 09/15/2019, 4:08 PM

## 2019-09-16 LAB — PHOSPHORUS: Phosphorus: 2.5 mg/dL (ref 2.5–4.6)

## 2019-09-16 LAB — COMPREHENSIVE METABOLIC PANEL
ALT: 11 U/L (ref 0–44)
AST: 14 U/L — ABNORMAL LOW (ref 15–41)
Albumin: 3 g/dL — ABNORMAL LOW (ref 3.5–5.0)
Alkaline Phosphatase: 54 U/L (ref 38–126)
Anion gap: 10 (ref 5–15)
BUN: 12 mg/dL (ref 8–23)
CO2: 28 mmol/L (ref 22–32)
Calcium: 9 mg/dL (ref 8.9–10.3)
Chloride: 101 mmol/L (ref 98–111)
Creatinine, Ser: 0.87 mg/dL (ref 0.44–1.00)
GFR calc Af Amer: >60 mL/min (ref >60)
GFR calc non Af Amer: >60 mL/min (ref >60)
Glucose, Bld: 104 mg/dL — ABNORMAL HIGH (ref 70–99)
Potassium: 3.1 mmol/L — ABNORMAL LOW (ref 3.5–5.1)
Sodium: 139 mmol/L (ref 135–145)
Total Bilirubin: 0.9 mg/dL (ref 0.3–1.2)
Total Protein: 5.7 g/dL — ABNORMAL LOW (ref 6.5–8.1)

## 2019-09-16 LAB — CBC
HCT: 32.4 % — ABNORMAL LOW (ref 36.0–46.0)
Hemoglobin: 10.3 g/dL — ABNORMAL LOW (ref 12.0–15.0)
MCH: 28.8 pg (ref 26.0–34.0)
MCHC: 31.8 g/dL (ref 30.0–36.0)
MCV: 90.5 fL (ref 80.0–100.0)
Platelets: 262 10*3/uL (ref 150–400)
RBC: 3.58 MIL/uL — ABNORMAL LOW (ref 3.87–5.11)
RDW: 15.6 % — ABNORMAL HIGH (ref 11.5–15.5)
WBC: 5.8 10*3/uL (ref 4.0–10.5)
nRBC: 0 % (ref 0.0–0.2)

## 2019-09-16 LAB — CULTURE, BLOOD (ROUTINE X 2)
Culture: NO GROWTH
Culture: NO GROWTH
Special Requests: ADEQUATE
Special Requests: ADEQUATE

## 2019-09-16 LAB — MAGNESIUM: Magnesium: 2.1 mg/dL (ref 1.7–2.4)

## 2019-09-16 MED ORDER — POTASSIUM CHLORIDE 20 MEQ PO PACK
40.0000 meq | PACK | Freq: Once | ORAL | Status: AC
  Administered 2019-09-16: 40 meq via ORAL
  Filled 2019-09-16: qty 2

## 2019-09-16 NOTE — Plan of Care (Signed)

## 2019-09-16 NOTE — Progress Notes (Signed)
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  No report of fever or chills on presentation.  On presentation she was found to have acute kidney injury, urinary retention.  Foley was placed in the emergency department.  Started on antibiotic for urinary tract infection.  Admitted for the management of urinary tract infection, dehydration, AKI.  Hospital course 09/12/2019: Acute kidney injury has resolved.  Urine culture is growing gram-negative rods.  Final cultures are pending.  We will continue antibiotics for now.  Patient also had fecal impaction.  Patient has had an enema as per collateral information.  Repeat abdominal KUB reveals normal gas pattern.  Physical therapy input is appreciated, CIR is recommended.  Will consult rehab medicine team.  Updated patient's husband extensively.  All questions were answered.  09/13/2019: Patient seen.  Patient feels a lot better today.  Lose/watery stool reported today.  No fever or chills.  No abdominal pain.  Patient received recent enema.  Urine culture final result is still pending.  We will continue antibiotics for now.  Patient deemed not a candidate for CIR.  Patient is awaiting skilled nursing facility placement.  Hopefully, patient will be discharged once a bed is available.  Social work input is appreciated.  09/14/2019: Patient seen.  No new changes.  Patient has remained stable.  Discussed with the infectious disease team over the phone, Dr. Karolee Ohs, patient will complete 10-day course of IV meropenem.  Midline ordered.  Hopefully,  patient will be discharged tomorrow to skilled nursing facility. 09/15/2019.  Discussed with case worker and both facilities are refusing to accept patient due to being on meropenem, which according to them is expensive to procure Facility will also prefer PICC line for long-term antibiotic therapy rather than midline. Case worker working on alternative placement arrangement. 09/16/2019: No SNF is open to accepting patient with IV meropenem, contact isolation.  Patient is on day 4 of 10 on IV meropenem. Patient will have to be in hospital to complete 10-day course of meropenem.  ID recommendation for ESBL E. coli UTI  Assessment & Plan:   Principal Problem:   Pyelonephritis Active Problems:   Primary cancer of right upper lobe of lung ([place])   Urinary retention   1.  Suspected pyonephritis/urinary tract infection: Presented with dysuria.  Has history of frequent UTI.  UA was grossly suggestive of urinary tract infection. CT abdomen/pelvis showed small left renal calculi but no obstructing ureteral calculi or bladder calculi,mild mucosal thickening of the left renal collecting system and possible areas of ureteral wall.Suspected  Pyonephrosis. Continue ceftriaxone for now.  Urine culture, blood culture should be followed.  Currently she is hemodynamically stable, afebrile.  No leukocytosis 09/13/2019: Follow final cultures.  Continue antibiotics for now. 09/14/2019: Urine culture grew E. coli, ESBL positive.  Patient is currently on IV meropenem.  IV meropenem was started on 09/13/2019.[name]   Patient will need to complete 10-day course of IV meropenem, EOT with end of treatment on 09/22/2019 with  09/16/2019.  No facility is ready to accept patient with IV meropenem which is considered expensive to Procure.  Patient is on day 4  of 10 on meropenem. She will continue with meropenem for now while on admission.  Urinary retention: Foley was placed in the emergency department.  The bladder was not  significantly distended as per CT scan so we will give her a voiding trial.  Start on tamsulosin.  Might need to reinsert the Foley if she retains.  She has history of urinary retention in the past  09/12/2019: Follow with urology on discharge.  Patient is known to the urology team.  Complete treatment for UTI.    Follow-up with urology on discharge.  AKI: Baseline creatinine normal.  Kidney function has almost returned to baseline.  Continue gentle IV fluids for today.  Consider stopping fluids tomorrow. 09/12/2019: Resolved.  Debility/deconditioning: She is mostly bedbound.  She had a fracture of hip in October last year.  History of frequent falls .CT imaging showed new T10 compression fracture since February 2019,remote compression fractures of L1 and L4.  She does not complain of severe back pain so kyphoplasty is not necessary at this point.  PT/OT requested SNF Caseworkers seeking for tentative SNF placement.  Patient will be discharged to SNF after completion of IV meropenem on 09/22/2019 .  Normocytic anemia: Most likely associated  with anemia of chronic disease.  Continue to monitor CBC  Non-small cell right lung cancer: Currently in remission.  Status post lobectomy.  Follows with Dr. Julien Nordmann.  On oral chemotherapy with Tagrisso.  History of brain metastasis, with resection.  History of dementia: Currently alert and oriented.  On Aricept from home.  History of DVT: Continue Xarelto.  Constipation:  Continue bowel regimen 09/12/2019: Repeat abdominal KUB is nonrevealing.  DVT prophylaxis: Xarelto Code Status: Full Family Communication: I discussed extensively with patient's spouse who is at bedside and discharge planning.  Patient spouse is agreeable with continued IV meropenem while patient is in hospital.   Disposition Plan: Patient will be discharged to skilled nursing facility Barrier to discharge include currently on IV meropenem which appears expensive for nursing facility and  current diagnosis of ESBL with contact precaution.  Patient will continue to remain in hospital until completion of IV meropenem on 09/22/2019 given no facility is agreeable to accept patient.  Consultants: Rehab team consulted (Deemed not a candidate for CIR).  Procedures: None  Antimicrobials:  Anti-infectives (From admission, onward)   Start     Dose/Rate Route Frequency Ordered Stop   09/13/19 1800  meropenem (MERREM) 1 g in sodium chloride 0.9 % 100 mL IVPB     1 g 200 mL/hr over 30 Minutes Intravenous Every 8 hours 09/13/19 1652     09/13/19 1700  meropenem (MERREM) 500 mg in sodium chloride 0.9 % 100 mL IVPB  Status:  Discontinued     500 mg 200 mL/hr over 30 Minutes Intravenous Every 8 hours 09/13/19 1651 09/13/19 1652   09/10/19 2000  cefTRIAXone (ROCEPHIN) 1 g in sodium chloride 0.9 % 100 mL IVPB  Status:  Discontinued     1 g 200 mL/hr over 30 Minutes Intravenous Every 24 hours 09/10/19 1901 09/13/19 1651   09/10/19 1745  cefTRIAXone (ROCEPHIN) 1 g in sodium chloride 0.9 % 100 mL IVPB  Status:  Discontinued     1 g 200 mL/hr over 30 Minutes Intravenous  Once 09/10/19 1732 09/10/19 1814      Subjective: Patient was seen and examined at bedside.  Denies acute distress.  Alert and oriented.  No acute overnight event was reported. Patient awaiting for discharge to SNF  Objective: Vitals:   09/15/19 1413 09/15/19 2235 09/16/19 0448 09/16/19 1402  BP: 122/66 116/61 129/63 117/69  Pulse: 82 88 94 95  Resp: 18 18 18 17   Temp: 97.9 F (36.6 C) 98.2 F (36.8 C) 98.7 F (37.1 C) 98.2 F (36.8 C)  TempSrc: Oral Oral Oral Oral  SpO2: 99% 96% 95% 98%  Weight:      Height:        Intake/Output Summary (Last 24 hours) at 09/16/2019 1553 Last data filed at 09/16/2019 1050 Gross per 24 hour  Intake 1613.64 ml  Output 1201 ml  Net 412.64 ml   Filed Weights   09/10/19 1835  Weight: 81.6 kg    Examination: General exam: Not in any distress.  Patient is awake and  alert. Respiratory system: Clear to auscultation. Cardiovascular system: S1 & S2 heard Gastrointestinal system: Abdomen is nondistended, soft and nontender. Normal bowel sounds heard. Central nervous system: Awake and alert.  Patient moves all extremities.   Extremities: No leg edema.  Data Reviewed: I have personally reviewed following labs and imaging studies  CBC: Recent Labs  Lab 09/10/19 1155 09/11/19 0406 09/12/19 0436 09/16/19 0413  WBC 5.3 3.6* 4.9 5.8  NEUTROABS 3.8  --  3.0  --   HGB 10.3* 8.7* 8.8* 10.3*  HCT 34.7* 28.6* 29.7* 32.4*  MCV 92.8 92.0 94.3 90.5  PLT 284 218 222 580   Basic Metabolic Panel: Recent Labs  Lab 09/10/19 1155 09/11/19 0406 09/12/19 0612 09/16/19 0413  NA 142 142 139 139  K 3.3* 3.5 4.2 3.1*  CL 104 110 107 101  CO2 27 23 25 28   GLUCOSE 110* 127* 95 104*  BUN 41* 30* 15 12  CREATININE 1.84* 1.10* 0.79 0.87  CALCIUM 9.9 8.8* 9.0 9.0  MG  --   --   --  2.1  PHOS  --   --   --  2.5   GFR: Estimated Creatinine Clearance: 64.8 mL/min (by C-G formula based on SCr of 0.87 mg/dL). Liver Function Tests: Recent Labs  Lab 09/10/19 1155 09/16/19 0413  AST 10* 14*  ALT 8 11  ALKPHOS 65 54  BILITOT 0.6 0.9  PROT 6.8 5.7*  ALBUMIN 3.4* 3.0*   No results for input(s): LIPASE, AMYLASE in the last 168 hours. No results for input(s): AMMONIA in the last 168 hours. Coagulation Profile: No results for input(s): INR, PROTIME in the last 168 hours. Cardiac Enzymes: No results for input(s): CKTOTAL, CKMB, CKMBINDEX, TROPONINI in the last 168 hours. BNP (last 3 results) No results for input(s): PROBNP in the last 8760 hours. HbA1C: No results for input(s): HGBA1C in the last 72 hours. CBG: No results for input(s): GLUCAP in the last 168 hours. Lipid Profile: No results for input(s): CHOL, HDL, LDLCALC, TRIG, CHOLHDL, LDLDIRECT in the last 72 hours. Thyroid Function Tests: No results for input(s): TSH, T4TOTAL, FREET4, T3FREE, THYROIDAB in  the last 72 hours. Anemia Panel: No results for input(s): VITAMINB12, FOLATE, FERRITIN, TIBC, IRON, RETICCTPCT in the last 72 hours. Sepsis Labs: No results for input(s): PROCALCITON, LATICACIDVEN in the last 168 hours.  Recent Results (from the past 240 hour(s))  SARS CORONAVIRUS 2 (TAT 6-24 HRS) Nasopharyngeal Nasopharyngeal Swab     Status: None   Collection Time: 09/10/19  3:13 PM   Specimen: Nasopharyngeal Swab  Result Value Ref Range Status   SARS Coronavirus 2 NEGATIVE NEGATIVE Final    Comment: (NOTE) SARS-CoV-2 target nucleic acids are NOT DETECTED. The SARS-CoV-2 RNA is  generally detectable in upper and lower respiratory specimens during the acute phase of infection. Negative results do not preclude SARS-CoV-2 infection, do not rule out co-infections with other pathogens, and should not be used as the sole basis for treatment or other patient management decisions. Negative results must be combined with clinical observations, patient history, and epidemiological information. The expected result is Negative. Fact Sheet for Patients: SugarRoll.be Fact Sheet for Healthcare Providers: https://www.woods-mathews.com/ This test is not yet approved or cleared by the [place] FDA and  has been authorized for detection and/or diagnosis of SARS-CoV-2 by FDA under an Emergency Use Authorization (EUA). This EUA will remain  in effect (meaning this test can be used) for the duration of the COVID-19 declaration under Section 56 4(b)(1) of the Act, 21 U.S.C. section 360bbb-3(b)(1), unless the authorization is terminated or revoked sooner. Performed at [facility], [place] [address], [place], [place] [address]   Culture, Urine     Status: Abnormal   Collection Time: 09/11/19 12:00 AM   Specimen: Urine, Random  Result Value Ref Range Status   Specimen Description   Final    URINE, RANDOM Performed at [place] [address] [place], [place] [address]    Special Requests   Final    NONE Performed at [facility], [place] [address], [place], [place] [address]    Culture (A)  Final    80,000 COLONIES/mL ESCHERICHIA COLI Confirmed Extended Spectrum Beta-Lactamase Producer (ESBL).  In bloodstream infections from ESBL organisms, carbapenems are preferred over piperacillin/tazobactam. They are shown to have a lower risk of mortality.    Report Status 09/13/2019 FINAL  Final   Organism ID, Bacteria ESCHERICHIA COLI (A)  Final      Susceptibility   Escherichia coli - MIC*    AMPICILLIN >=32 RESISTANT Resistant     CEFAZOLIN <=4 SENSITIVE Sensitive     CEFTRIAXONE 1 SENSITIVE Sensitive     CIPROFLOXACIN <=0.25 SENSITIVE Sensitive     GENTAMICIN <=1 SENSITIVE Sensitive     IMIPENEM <=0.25 SENSITIVE Sensitive     NITROFURANTOIN 32 SENSITIVE Sensitive     TRIMETH/SULFA <=20 SENSITIVE Sensitive     AMPICILLIN/SULBACTAM 16 INTERMEDIATE Intermediate     PIP/TAZO 8 SENSITIVE Sensitive     * 80,000 COLONIES/mL ESCHERICHIA COLI  Culture, blood (routine x 2)     Status: None   Collection Time: 09/11/19  8:23 AM   Specimen: BLOOD  Result Value Ref Range Status   Specimen Description   Final    BLOOD RIGHT ARM Performed at [place] [address] [place], [place] [address]    Special Requests   Final    BOTTLES DRAWN AEROBIC AND ANAEROBIC Blood Culture adequate volume Performed at [place] [address] [place], [place] [address]    Culture   Final    NO GROWTH 5 DAYS Performed at [facility], [place] [address] [place], [place] [address]    Report Status 09/16/2019 FINAL  Final  Culture, blood (routine x 2)     Status: None   Collection Time: 09/11/19  8:23 AM   Specimen: BLOOD  Result Value Ref Range Status   Specimen Description   Final    BLOOD LEFT HAND Performed at [place] [address], [place], [place] [address]    Special Requests   Final    BOTTLES DRAWN AEROBIC ONLY Blood Culture adequate volume Performed  at [facility], [place] [address], [place], [place] [address]    Culture   Final    NO GROWTH 5 DAYS Performed at [facility], [place] [address], [place], [place] [address]    Report Status 09/16/2019 FINAL  Final  SARS CORONAVIRUS 2 (TAT 6-24 HRS) Nasopharyngeal Nasopharyngeal Swab     Status: None   Collection Time: 09/14/19  9:25 AM   Specimen: Nasopharyngeal Swab  Result Value Ref Range Status   SARS Coronavirus 2 NEGATIVE NEGATIVE Final    Comment: (NOTE) SARS-CoV-2 target nucleic acids are NOT DETECTED. The SARS-CoV-2 RNA is generally detectable in upper and lower respiratory specimens during the acute phase of infection. Negative results do not preclude SARS-CoV-2 infection, do not rule out co-infections with other pathogens, and should not be used as the sole basis for treatment or other patient management decisions. Negative results must be combined with clinical observations, patient history, and epidemiological information. The expected result is Negative. Fact Sheet for Patients: SugarRoll.be Fact Sheet for Healthcare Providers: https://www.woods-mathews.com/ This test is not yet approved or cleared by the [place] FDA and  has been authorized for detection and/or diagnosis of SARS-CoV-2 by FDA under an Emergency Use Authorization (EUA). This EUA will remain  in effect (meaning this test can be used) for the duration of the COVID-19 declaration under Section 56 4(b)(1) of the Act, 21 U.S.C. section 360bbb-3(b)(1), unless the authorization is terminated or revoked sooner. Performed at [facility], [place] [address], [place], [place] [address]          Radiology Studies: No results found.      Scheduled Meds: . vitamin C  250 mg Oral q1800  . Chlorhexidine  Gluconate Cloth  6 each Topical Daily  . cholecalciferol  1,000 Units Oral QPM  . donepezil  10 mg Oral QPM  . multivitamin with minerals  1 tablet Oral q1800  . polyethylene glycol  17 g Oral Daily  . rivaroxaban  20 mg Oral QPM  . sodium chloride flush  10-40 mL Intracatheter Q12H  . tamsulosin  0.4 mg Oral QPC supper   Continuous Infusions: . meropenem (MERREM) IV 1 g (09/16/19 1014)     LOS: 6 days    Time spent: 35 mins.   Elie Confer, MD [facility] Pager: [phone]  If 7PM-7AM, please contact night-coverage [email or web address] Password [facility] 09/16/2019, 3:53 PM

## 2019-09-17 ENCOUNTER — Inpatient Hospital Stay: Payer: Self-pay

## 2019-09-17 LAB — CBC
HCT: 29.4 % — ABNORMAL LOW (ref 36.0–46.0)
Hemoglobin: 9.1 g/dL — ABNORMAL LOW (ref 12.0–15.0)
MCH: 28.2 pg (ref 26.0–34.0)
MCHC: 31 g/dL (ref 30.0–36.0)
MCV: 91 fL (ref 80.0–100.0)
Platelets: 206 10*3/uL (ref 150–400)
RBC: 3.23 MIL/uL — ABNORMAL LOW (ref 3.87–5.11)
RDW: 15.7 % — ABNORMAL HIGH (ref 11.5–15.5)
WBC: 5.1 10*3/uL (ref 4.0–10.5)
nRBC: 0 % (ref 0.0–0.2)

## 2019-09-17 LAB — COMPREHENSIVE METABOLIC PANEL
ALT: 12 U/L (ref 0–44)
AST: 13 U/L — ABNORMAL LOW (ref 15–41)
Albumin: 2.8 g/dL — ABNORMAL LOW (ref 3.5–5.0)
Alkaline Phosphatase: 48 U/L (ref 38–126)
Anion gap: 7 (ref 5–15)
BUN: 10 mg/dL (ref 8–23)
CO2: 28 mmol/L (ref 22–32)
Calcium: 8.9 mg/dL (ref 8.9–10.3)
Chloride: 102 mmol/L (ref 98–111)
Creatinine, Ser: 0.86 mg/dL (ref 0.44–1.00)
GFR calc Af Amer: >60 mL/min (ref >60)
GFR calc non Af Amer: >60 mL/min (ref >60)
Glucose, Bld: 99 mg/dL (ref 70–99)
Potassium: 3.3 mmol/L — ABNORMAL LOW (ref 3.5–5.1)
Sodium: 137 mmol/L (ref 135–145)
Total Bilirubin: 0.6 mg/dL (ref 0.3–1.2)
Total Protein: 5.2 g/dL — ABNORMAL LOW (ref 6.5–8.1)

## 2019-09-17 LAB — SARS CORONAVIRUS 2 (TAT 6-24 HRS): SARS Coronavirus 2: NEGATIVE

## 2019-09-17 LAB — PHOSPHORUS: Phosphorus: 2.8 mg/dL (ref 2.5–4.6)

## 2019-09-17 LAB — MAGNESIUM: Magnesium: 2.2 mg/dL (ref 1.7–2.4)

## 2019-09-17 MED ORDER — SODIUM CHLORIDE 0.9% FLUSH: 10.0000 mL | INTRAVENOUS | Status: DC | PRN

## 2019-09-17 MED ORDER — POTASSIUM CHLORIDE CRYS ER 20 MEQ PO TBCR
20.0000 meq | EXTENDED_RELEASE_TABLET | Freq: Two times a day (BID) | ORAL | Status: DC
  Administered 2019-09-17 – 2019-09-18 (×3): 20 meq via ORAL
  Filled 2019-09-17 (×3): qty 1

## 2019-09-17 NOTE — Care Management Important Message (Signed)
Important Message  Patient Details IM Letter given to [place] Case Manager to present to the Patient Name: Valerie [name] MRN: [id number] Date of Birth: February 10, 1946   Medicare Important Message Given:  Yes     Kerin Salen 09/17/2019, 10:46 AM

## 2019-09-17 NOTE — TOC Progression Note (Addendum)
Transition of Care [facility]) - Progression Note    Patient Details  Name: Valerie [name] MRN: [id number] Date of Birth: 1946/01/18  [facility] CM/SW [place], [place] Phone Number: 09/17/2019, 11:23 AM  Clinical Narrative:  CSW spoke with Claiborne Billings at [facility], they can provide meropenem antibiotic medication. They will not accept a Midline and request PICC be placed.  CSW reached out to the patient spouse and met with the patient at bedside to discuss SNF option-[facility]. CSW explain the facility can provide the meropenem antibiotic and continue therapy. Patient reports she does not walk but stands and pivots to her wheelchair. Spouse reports the patient has fear of falling, her goal at SNF is to "transfer from bed to commode, wheel chair with confidence."  SW updated [place]. Patient Auth# [id number] approved for 5 days, starting Jan. 29th-next review date is Feb. 2. Patient will need covid test. Physician notified.     Expected Discharge Plan: Skilled Nursing Facility(patient would like to go into rehab following her hospitalization) Barriers to Discharge: Other (comment)(covid-19 test)  Expected Discharge Plan and Services Expected Discharge Plan: Skilled Nursing Facility(patient would like to go into rehab following her hospitalization) In-house Referral: Clinical Social Work   Post Acute Care Choice: Skilled Nursing Facility(patient would like to go to a rehab following her hospitalization, preferably [facility]) Living arrangements for the past 2 months: [place]                                       Social Determinants of Health (SDOH) Interventions    Readmission Risk Interventions No flowsheet data found.

## 2019-09-17 NOTE — Progress Notes (Signed)
Peripherally Inserted Central Catheter/Midline Placement  The IV Nurse has discussed with the patient and/or persons authorized to consent for the patient, the purpose of this procedure and the potential benefits and risks involved with this procedure.  The benefits include less needle sticks, lab draws from the catheter, and the patient may be discharged home with the catheter. Risks include, but not limited to, infection, bleeding, blood clot (thrombus formation), and puncture of an artery; nerve damage and irregular heartbeat and possibility to perform a PICC exchange if needed/ordered by physician.  Alternatives to this procedure were also discussed.  Bard Power PICC patient education guide, fact sheet on infection prevention and patient information card has been provided to patient /or left at bedside.    PICC/Midline Placement Documentation  PICC Single Lumen 09/17/19 PICC Left Brachial 43 cm 0 cm (Active)  Indication for Insertion or Continuance of Line Home intravenous therapies (PICC only) 09/17/19 2015  Exposed Catheter (cm) 0 cm 09/17/19 2015  Site Assessment Clean;Dry;Intact 09/17/19 2015  Line Status Blood return noted;Flushed;Saline locked 09/17/19 2015  Dressing Type Transparent;Occlusive;Securing device 09/17/19 2015  Dressing Status Clean;Dry;Intact;Antimicrobial disc in place 09/17/19 2015  Line Adjustment (NICU/IV Team Only) No 09/17/19 2015  Dressing Intervention New dressing 09/17/19 2015  Dressing Change Due 09/24/19 09/17/19 2015       Edson Snowball 09/17/2019, 8:38 PM

## 2019-09-17 NOTE — Progress Notes (Addendum)
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  No report of fever or chills on presentation.  On presentation she was found to have acute kidney injury, urinary retention.  Foley was placed in the emergency department.  Started on antibiotic for urinary tract infection.  Admitted for the management of urinary tract infection, dehydration, AKI.  Hospital course 09/12/2019: Acute kidney injury has resolved.  Urine culture is growing gram-negative rods.  Final cultures are pending.  We will continue antibiotics for now.  Patient also had fecal impaction.  Patient has had an enema as per collateral information.  Repeat abdominal KUB reveals normal gas pattern.  Physical therapy input is appreciated, CIR is recommended.  Will consult rehab medicine team.  Updated patient's husband extensively.  All questions were answered.  09/13/2019: Patient seen.  Patient feels a lot better today.  Lose/watery stool reported today.  No fever or chills.  No abdominal pain.  Patient received recent enema.  Urine culture final result is still pending.  We will continue antibiotics for now.  Patient deemed not a candidate for CIR.  Patient is awaiting skilled nursing facility placement.  Hopefully, patient will be discharged once a bed is available.  Social work input is appreciated.  09/14/2019: Patient seen.  No new changes.  Patient has remained stable.  Discussed with the infectious disease team over the phone, Dr. Karolee Ohs, patient will complete 10-day course of IV meropenem.  Midline ordered.  Hopefully,  patient will be discharged tomorrow to skilled nursing facility. 09/15/2019.  Discussed with case worker and both facilities are refusing to accept patient due to being on meropenem, which according to them is expensive to procure Facility will also prefer PICC line for long-term antibiotic therapy rather than midline. Case worker working on alternative placement arrangement. 09/16/2019: No SNF is open to accepting patient with IV meropenem, contact isolation.  Patient is on day 4 of 10 on IV meropenem. Patient will have to be in hospital to complete 10-day course of meropenem.  ID recommendation for ESBL E. coli UTI 09/17/2019: No new complaints today patient on day 5 of 10 on meropenem. Patient appears to have been accepted to SNF at [facility]. Will consult IV team to place PICC line and possible discharge to SNF. Tomorrow 09/18/2019  Assessment & Plan:   Principal Problem:   Pyelonephritis Active Problems:   Primary cancer of right upper lobe of lung ([place])   Urinary retention   1.  Suspected pyonephritis/urinary tract infection: Presented with dysuria.  Has history of frequent UTI.  UA was grossly suggestive of urinary tract infection. CT abdomen/pelvis showed small left renal calculi but no obstructing ureteral calculi or bladder calculi,mild mucosal thickening of the left renal collecting system and possible areas of ureteral wall.Suspected  Pyonephrosis. Continue ceftriaxone for now.  Urine culture, blood culture should be followed.  Currently she is hemodynamically stable, afebrile.  No leukocytosis 09/13/2019: Follow final cultures.  Continue antibiotics for now. 09/14/2019: Urine culture grew E. coli, ESBL positive.  Patient is currently on IV meropenem.  IV meropenem was started on 09/13/2019.[name]   Patient will need to  complete 10-day course of IV meropenem, EOT with end of treatment on 09/22/2019 with  09/16/2019.  No facility is ready to accept patient with IV meropenem which is  considered expensive to Procure.  Patient is on day 4 of 10 on meropenem. She will continue with meropenem for now while on admission. 09/17/2019.  Day 5 of 10 on meropenem Consult IV team to place PICC line and possible discharge to [facility] tomorrow 09/18/2019  Urinary retention: Foley was placed in the emergency department.  The bladder was not significantly distended as per CT scan so we will give her a voiding trial.  Start on tamsulosin.  Might need to reinsert the Foley if she retains.  She has history of urinary retention in the past  09/12/2019: Follow with urology on discharge.  Patient is known to the urology team.  Complete treatment for UTI.    Follow-up with urology on discharge.  AKI: Baseline creatinine normal.  Kidney function has almost returned to baseline.  Continue gentle IV fluids for today.  Consider stopping fluids tomorrow. 09/12/2019: Resolved.  Debility/deconditioning: She is mostly bedbound.  She had a fracture of hip in October last year.  History of frequent falls .CT imaging showed new T10 compression fracture since February 2019,remote compression fractures of L1 and L4.  She does not complain of severe back pain so kyphoplasty is not necessary at this point.  PT/OT requested SNF Caseworkers seeking for tentative SNF placement.  Patient will be discharged to SNF after completion of IV meropenem on 09/22/2019 .  Normocytic anemia: Most likely associated  with anemia of chronic disease.  Continue to monitor CBC  Non-small cell right lung cancer: Currently in remission.  Status post lobectomy.  Follows with Dr. Julien Nordmann.  On oral chemotherapy with Tagrisso.  History of brain metastasis, with resection.  History of dementia: Currently alert and oriented.  On Aricept from home.  History of DVT: Continue Xarelto.  Constipation:  Continue bowel regimen 09/12/2019: Repeat abdominal KUB is nonrevealing.  DVT prophylaxis: Xarelto Code Status: Full Family  Communication: I discussed extensively with patient's spouse who is at bedside and discharge planning.  Patient spouse is agreeable with continued IV meropenem while patient is in hospital.   Disposition Plan: Patient will be discharged to skilled nursing facility Barrier to discharge include currently on IV meropenem which appears expensive for nursing facility and current diagnosis of ESBL with contact precaution.  Patient appears to be accepted at [facility].  Consulted IV team to place PICC line and will possibly discharge to SNF tomorrow 09/18/2019.  Consultants: Rehab team consulted (Deemed not a candidate for CIR).  Procedures: None  Antimicrobials:  Anti-infectives (From admission, onward)   Start     Dose/Rate Route Frequency Ordered Stop   09/13/19 1800  meropenem (MERREM) 1 g in sodium chloride 0.9 % 100 mL IVPB     1 g 200 mL/hr over 30 Minutes Intravenous Every 8 hours 09/13/19 1652     09/13/19 1700  meropenem (MERREM) 500 mg in sodium chloride 0.9 % 100 mL IVPB  Status:  Discontinued     500 mg 200 mL/hr over 30 Minutes Intravenous Every 8 hours 09/13/19 1651 09/13/19 1652   09/10/19 2000  cefTRIAXone (ROCEPHIN) 1 g in sodium chloride 0.9 % 100 mL IVPB  Status:  Discontinued     1 g 200 mL/hr over 30 Minutes Intravenous Every 24 hours 09/10/19 1901 09/13/19 1651   09/10/19 1745  cefTRIAXone (ROCEPHIN) 1 g  in sodium chloride 0.9 % 100 mL IVPB  Status:  Discontinued     1 g 200 mL/hr over 30 Minutes Intravenous  Once 09/10/19 1732 09/10/19 1814      Subjective: Patient was seen and examined at bedside.  Denies acute distress.  Alert and oriented.  No acute overnight event was reported. Accepted at [facility] with plan to discharge today after placement of PICC line by IV team.  Objective: Vitals:   09/16/19 0448 09/16/19 1402 09/16/19 2047 09/17/19 0601  BP: 129/63 117/69 119/62 122/63  Pulse: 94 95 88 79  Resp: 18 17 16 16   Temp: 98.7 F (37.1 C) 98.2  F (36.8 C) 98.3 F (36.8 C) 98 F (36.7 C)  TempSrc: Oral Oral Oral Oral  SpO2: 95% 98% 99% 98%  Weight:      Height:        Intake/Output Summary (Last 24 hours) at 09/17/2019 0751 Last data filed at 09/17/2019 0601 Gross per 24 hour  Intake 910 ml  Output 300 ml  Net 610 ml   Filed Weights   09/10/19 1835  Weight: 81.6 kg    Examination: General exam: Not in any distress.  Patient is awake and alert. Respiratory system: Clear to auscultation. Cardiovascular system: S1 & S2 heard Gastrointestinal system: Abdomen is nondistended, soft and nontender. Normal bowel sounds heard. Central nervous system: Awake and alert.  Patient moves all extremities.   Extremities: No leg edema.  Data Reviewed: I have personally reviewed following labs and imaging studies  CBC: Recent Labs  Lab 09/10/19 1155 09/11/19 0406 09/12/19 0436 09/16/19 0413 09/17/19 0433  WBC 5.3 3.6* 4.9 5.8 5.1  NEUTROABS 3.8  --  3.0  --   --   HGB 10.3* 8.7* 8.8* 10.3* 9.1*  HCT 34.7* 28.6* 29.7* 32.4* 29.4*  MCV 92.8 92.0 94.3 90.5 91.0  PLT 284 218 222 262 017   Basic Metabolic Panel: Recent Labs  Lab 09/10/19 1155 09/11/19 0406 09/12/19 0612 09/16/19 0413 09/17/19 0433  NA 142 142 139 139 137  K 3.3* 3.5 4.2 3.1* 3.3*  CL 104 110 107 101 102  CO2 27 23 25 28 28   GLUCOSE 110* 127* 95 104* 99  BUN 41* 30* 15 12 10   CREATININE 1.84* 1.10* 0.79 0.87 0.86  CALCIUM 9.9 8.8* 9.0 9.0 8.9  MG  --   --   --  2.1 2.2  PHOS  --   --   --  2.5 2.8   GFR: Estimated Creatinine Clearance: 65.6 mL/min (by C-G formula based on SCr of 0.86 mg/dL). Liver Function Tests: Recent Labs  Lab 09/10/19 1155 09/16/19 0413 09/17/19 0433  AST 10* 14* 13*  ALT 8 11 12   ALKPHOS 65 54 48  BILITOT 0.6 0.9 0.6  PROT 6.8 5.7* 5.2*  ALBUMIN 3.4* 3.0* 2.8*   No results for input(s): LIPASE, AMYLASE in the last 168 hours. No results for input(s): AMMONIA in the last 168 hours. Coagulation Profile: No results  for input(s): INR, PROTIME in the last 168 hours. Cardiac Enzymes: No results for input(s): CKTOTAL, CKMB, CKMBINDEX, TROPONINI in the last 168 hours. BNP (last 3 results) No results for input(s): PROBNP in the last 8760 hours. HbA1C: No results for input(s): HGBA1C in the last 72 hours. CBG: No results for input(s): GLUCAP in the last 168 hours. Lipid Profile: No results for input(s): CHOL, HDL, LDLCALC, TRIG, CHOLHDL, LDLDIRECT in the last 72 hours. Thyroid Function Tests: No results for input(s): TSH,  T4TOTAL, FREET4, T3FREE, THYROIDAB in the last 72 hours. Anemia Panel: No results for input(s): VITAMINB12, FOLATE, FERRITIN, TIBC, IRON, RETICCTPCT in the last 72 hours. Sepsis Labs: No results for input(s): PROCALCITON, LATICACIDVEN in the last 168 hours.  Recent Results (from the past 240 hour(s))  SARS CORONAVIRUS 2 (TAT 6-24 HRS) Nasopharyngeal Nasopharyngeal Swab     Status: None   Collection Time: 09/10/19  3:13 PM   Specimen: Nasopharyngeal Swab  Result Value Ref Range Status   SARS Coronavirus 2 NEGATIVE NEGATIVE Final    Comment: (NOTE) SARS-CoV-2 target nucleic acids are NOT DETECTED. The SARS-CoV-2 RNA is generally detectable in upper and lower respiratory specimens during the acute phase of infection. Negative results do not preclude SARS-CoV-2 infection, do not rule out co-infections with other pathogens, and should not be used as the sole basis for treatment or other patient management decisions. Negative results must be combined with clinical observations, patient history, and epidemiological information. The expected result is Negative. Fact Sheet for Patients: SugarRoll.be Fact Sheet for Healthcare Providers: https://www.woods-mathews.com/ This test is not yet approved or cleared by the [place] FDA and  has been authorized for detection and/or diagnosis of SARS-CoV-2 by FDA under an Emergency Use Authorization  (EUA). This EUA will remain  in effect (meaning this test can be used) for the duration of the COVID-19 declaration under Section 56 4(b)(1) of the Act, 21 U.S.C. section 360bbb-3(b)(1), unless the authorization is terminated or revoked sooner. Performed at [facility], [place] [address], [place], [place] [address]   Culture, Urine     Status: Abnormal   Collection Time: 09/11/19 12:00 AM   Specimen: Urine, Random  Result Value Ref Range Status   Specimen Description   Final    URINE, RANDOM Performed at [facility] [address], [place], [place] [address]    Special Requests   Final    NONE Performed at [facility], [place] [address] [place], [place] [address]    Culture (A)  Final    80,000 COLONIES/mL ESCHERICHIA COLI Confirmed Extended Spectrum Beta-Lactamase Producer (ESBL).  In bloodstream infections from ESBL organisms, carbapenems are preferred over piperacillin/tazobactam. They are shown to have a lower risk of mortality.    Report Status 09/13/2019 FINAL  Final   Organism ID, Bacteria ESCHERICHIA COLI (A)  Final      Susceptibility   Escherichia coli - MIC*    AMPICILLIN >=32 RESISTANT Resistant     CEFAZOLIN <=4 SENSITIVE Sensitive     CEFTRIAXONE 1 SENSITIVE Sensitive     CIPROFLOXACIN <=0.25 SENSITIVE Sensitive     GENTAMICIN <=1 SENSITIVE Sensitive     IMIPENEM <=0.25 SENSITIVE Sensitive     NITROFURANTOIN 32 SENSITIVE Sensitive     TRIMETH/SULFA <=20 SENSITIVE Sensitive     AMPICILLIN/SULBACTAM 16 INTERMEDIATE Intermediate     PIP/TAZO 8 SENSITIVE Sensitive     * 80,000 COLONIES/mL ESCHERICHIA COLI  Culture, blood (routine x 2)     Status: None   Collection Time: 09/11/19  8:23 AM   Specimen: BLOOD  Result Value Ref Range Status   Specimen Description   Final    BLOOD RIGHT ARM Performed at [facility] [address] [place], [place] [address]    Special Requests   Final    BOTTLES DRAWN  AEROBIC AND ANAEROBIC Blood Culture adequate volume Performed at [facility] [address], [place], [place] [address]    Culture  Final    NO GROWTH 5 DAYS Performed at [facility], [place] [address], [place], [place] [address]    Report Status 09/16/2019 FINAL  Final  Culture, blood (routine x 2)     Status: None   Collection Time: 09/11/19  8:23 AM   Specimen: BLOOD  Result Value Ref Range Status   Specimen Description   Final    BLOOD LEFT HAND Performed at [facility] [address], [place], [place] [address]    Special Requests   Final    BOTTLES DRAWN AEROBIC ONLY Blood Culture adequate volume Performed at [facility] [address] [place], [place] [address]    Culture   Final    NO GROWTH 5 DAYS Performed at [facility], [place] [address], [place], [place] [address]    Report Status 09/16/2019 FINAL  Final  SARS CORONAVIRUS 2 (TAT 6-24 HRS) Nasopharyngeal Nasopharyngeal Swab     Status: None   Collection Time: 09/14/19  9:25 AM   Specimen: Nasopharyngeal Swab  Result Value Ref Range Status   SARS Coronavirus 2 NEGATIVE NEGATIVE Final    Comment: (NOTE) SARS-CoV-2 target nucleic acids are NOT DETECTED. The SARS-CoV-2 RNA is generally detectable in upper and lower respiratory specimens during the acute phase of infection. Negative results do not preclude SARS-CoV-2 infection, do not rule out co-infections with other pathogens, and should not be used as the sole basis for treatment or other patient management decisions. Negative results must be combined with clinical observations, patient history, and epidemiological information. The expected result is Negative. Fact Sheet for Patients: SugarRoll.be Fact Sheet for Healthcare Providers: https://www.woods-mathews.com/ This test is not yet approved or cleared by the [place] FDA and  has been  authorized for detection and/or diagnosis of SARS-CoV-2 by FDA under an Emergency Use Authorization (EUA). This EUA will remain  in effect (meaning this test can be used) for the duration of the COVID-19 declaration under Section 56 4(b)(1) of the Act, 21 U.S.C. section 360bbb-3(b)(1), unless the authorization is terminated or revoked sooner. Performed at [facility], [place] [address], [place], [place] [address]          Radiology Studies: No results found.      Scheduled Meds: . vitamin C  250 mg Oral q1800  . Chlorhexidine Gluconate Cloth  6 each Topical Daily  . cholecalciferol  1,000 Units Oral QPM  . donepezil  10 mg Oral QPM  . multivitamin with minerals  1 tablet Oral q1800  . polyethylene glycol  17 g Oral Daily  . potassium chloride  20 mEq Oral BID  . rivaroxaban  20 mg Oral QPM  . sodium chloride flush  10-40 mL Intracatheter Q12H  . tamsulosin  0.4 mg Oral QPC supper   Continuous Infusions: . meropenem (MERREM) IV 1 g (09/17/19 0240)     LOS: 7 days    Time spent: 35 mins.   Elie Confer, MD [facility] Pager: [phone]  If 7PM-7AM, please contact night-coverage [email or web address] Password [facility] 09/17/2019, 7:51 AM

## 2019-09-17 NOTE — Progress Notes (Signed)
Physical Therapy Treatment Patient Details Name: Valerie [name] MRN: [id number] DOB: April 03, 1946 Today's Date: 09/17/2019    History of Present Illness Pt is a 74 y/o female admitted with c/o rectal pain.  Pt with hx of falling at home 06/10/19 in which she sustained a R distal femur fx and R tibial plateau fx. Pt is now s/p ORIF R LE and per pt has been advanced to WBAT R LE. PMH includes dementia, metastatic NSCL cancer and hx of resection of a brain tumor.    PT Comments    Pt very cooperative and progressing with sitting balance at EOB and stood x 4 with assist of 2 - 3 times with RW and once with stedy to transition from bed to recliner.   Follow Up Recommendations  SNF     Equipment Recommendations  None recommended by PT    Recommendations for Other Services OT consult     Precautions / Restrictions Precautions Precautions: Fall Precaution Comments: incontinence of bladder  Restrictions Weight Bearing Restrictions: No RLE Weight Bearing: Weight bearing as tolerated LLE Weight Bearing: Weight bearing as tolerated    Mobility  Bed Mobility Overal bed mobility: Needs Assistance Bed Mobility: Supine to Sit     Supine to sit: Min assist;Mod assist     General bed mobility comments: extra time and effort; use of pad to complete transition to EOB sitting 2* "rawness"   Transfers Overall transfer level: Needs assistance Equipment used: Ambulation equipment used;Rolling walker (2 wheeled) Transfers: Sit to/from Stand Sit to Stand: Mod assist;+2 physical assistance;+2 safety/equipment;From elevated surface         General transfer comment: vc for sequencing, improved ability to bring weight forward this session, mod A +2 for boost from elevated bed; Pt fearful of falling.  Pt stood x 3 with RW and assist of 2.  Stedy utilized to transfer bed to chair  Ambulation/Gait         Gait velocity: decr   General Gait Details: Pt stood only.  Able to take short  step back with R LE but unable to step with L LE    Stairs             Wheelchair Mobility    Modified Rankin (Stroke Patients Only)       Balance Overall balance assessment: Needs assistance Sitting-balance support: No upper extremity supported;Feet supported Sitting balance-Leahy Scale: Fair Sitting balance - Comments: initially requiring min assist with post lean but progressing to sitting at Sup level   Standing balance support: Bilateral upper extremity supported Standing balance-Leahy Scale: Poor Standing balance comment: posterior lean                            Cognition Arousal/Alertness: Awake/alert Behavior During Therapy: WFL for tasks assessed/performed Overall Cognitive Status: Within Functional Limits for tasks assessed                                        Exercises      General Comments General comments (skin integrity, edema, etc.): sits very quickly      Pertinent Vitals/Pain Pain Assessment: Faces Faces Pain Scale: Hurts little more Pain Location: perineal area raw from diarrhea and being cleaned repeatedly Pain Descriptors / Indicators: Grimacing;Guarding Pain Intervention(s): Limited activity within patient's tolerance;Monitored during session    Home Living  Prior Function            PT Goals (current goals can now be found in the care plan section) Acute Rehab PT Goals Patient Stated Goal: Regain IND PT Goal Formulation: With patient Time For Goal Achievement: 09/25/19 Potential to Achieve Goals: Fair Progress towards PT goals: Progressing toward goals    Frequency    Min 2X/week      PT Plan Current plan remains appropriate    Co-evaluation              AM-PAC PT "6 Clicks" Mobility   Outcome Measure  Help needed turning from your back to your side while in a flat bed without using bedrails?: A Little Help needed moving from lying on your back to  sitting on the side of a flat bed without using bedrails?: A Little Help needed moving to and from a bed to a chair (including a wheelchair)?: Total Help needed standing up from a chair using your arms (e.g., wheelchair or bedside chair)?: A Lot Help needed to walk in hospital room?: Total Help needed climbing 3-5 steps with a railing? : Total 6 Click Score: 11    End of Session Equipment Utilized During Treatment: Gait belt Activity Tolerance: Patient tolerated treatment well Patient left: in chair;with call bell/phone within reach;with chair alarm set Nurse Communication: Mobility status;Need for lift equipment PT Visit Diagnosis: Difficulty in walking, not elsewhere classified (R26.2);Muscle weakness (generalized) (M62.81)     Time: 6644-0347 PT Time Calculation (min) (ACUTE ONLY): 45 min  Charges:  $Therapeutic Activity: 23-37 mins                     [place] Pager [phone] Office [phone]    BRADSHAW,HUNTER 09/17/2019, 5:27 PM

## 2019-09-17 NOTE — Progress Notes (Signed)
Spoke with Apolonio Schneiders RN re PICC line placement possibly this pm.  RN to have pt back in the bed.

## 2019-09-18 DIAGNOSIS — Z959 Presence of cardiac and vascular implant and graft, unspecified: Secondary | ICD-10-CM | POA: Diagnosis not present

## 2019-09-18 DIAGNOSIS — R5381 Other malaise: Secondary | ICD-10-CM | POA: Diagnosis not present

## 2019-09-18 DIAGNOSIS — I10 Essential (primary) hypertension: Secondary | ICD-10-CM | POA: Diagnosis not present

## 2019-09-18 DIAGNOSIS — R339 Retention of urine, unspecified: Secondary | ICD-10-CM | POA: Diagnosis not present

## 2019-09-18 DIAGNOSIS — G610 Guillain-Barre syndrome: Secondary | ICD-10-CM | POA: Diagnosis not present

## 2019-09-18 DIAGNOSIS — C3490 Malignant neoplasm of unspecified part of unspecified bronchus or lung: Secondary | ICD-10-CM | POA: Diagnosis not present

## 2019-09-18 DIAGNOSIS — Z7401 Bed confinement status: Secondary | ICD-10-CM | POA: Diagnosis not present

## 2019-09-18 DIAGNOSIS — Z5111 Encounter for antineoplastic chemotherapy: Secondary | ICD-10-CM | POA: Diagnosis not present

## 2019-09-18 DIAGNOSIS — L293 Anogenital pruritus, unspecified: Secondary | ICD-10-CM | POA: Diagnosis not present

## 2019-09-18 DIAGNOSIS — R197 Diarrhea, unspecified: Secondary | ICD-10-CM | POA: Diagnosis not present

## 2019-09-18 DIAGNOSIS — C3491 Malignant neoplasm of unspecified part of right bronchus or lung: Secondary | ICD-10-CM | POA: Diagnosis not present

## 2019-09-18 DIAGNOSIS — E860 Dehydration: Secondary | ICD-10-CM | POA: Diagnosis not present

## 2019-09-18 DIAGNOSIS — I469 Cardiac arrest, cause unspecified: Secondary | ICD-10-CM | POA: Diagnosis not present

## 2019-09-18 DIAGNOSIS — N390 Urinary tract infection, site not specified: Secondary | ICD-10-CM | POA: Diagnosis not present

## 2019-09-18 DIAGNOSIS — N179 Acute kidney failure, unspecified: Secondary | ICD-10-CM | POA: Diagnosis not present

## 2019-09-18 DIAGNOSIS — D649 Anemia, unspecified: Secondary | ICD-10-CM | POA: Diagnosis not present

## 2019-09-18 DIAGNOSIS — R54 Age-related physical debility: Secondary | ICD-10-CM | POA: Diagnosis not present

## 2019-09-18 DIAGNOSIS — N12 Tubulo-interstitial nephritis, not specified as acute or chronic: Secondary | ICD-10-CM | POA: Diagnosis not present

## 2019-09-18 DIAGNOSIS — N10 Acute tubulo-interstitial nephritis: Secondary | ICD-10-CM | POA: Diagnosis not present

## 2019-09-18 DIAGNOSIS — L89610 Pressure ulcer of right heel, unstageable: Secondary | ICD-10-CM | POA: Diagnosis not present

## 2019-09-18 DIAGNOSIS — M2550 Pain in unspecified joint: Secondary | ICD-10-CM | POA: Diagnosis not present

## 2019-09-18 LAB — COMPREHENSIVE METABOLIC PANEL
ALT: 11 U/L (ref 0–44)
AST: 15 U/L (ref 15–41)
Albumin: 2.6 g/dL — ABNORMAL LOW (ref 3.5–5.0)
Alkaline Phosphatase: 46 U/L (ref 38–126)
Anion gap: 6 (ref 5–15)
BUN: 14 mg/dL (ref 8–23)
CO2: 29 mmol/L (ref 22–32)
Calcium: 8.7 mg/dL — ABNORMAL LOW (ref 8.9–10.3)
Chloride: 103 mmol/L (ref 98–111)
Creatinine, Ser: 0.72 mg/dL (ref 0.44–1.00)
GFR calc Af Amer: >60 mL/min (ref >60)
GFR calc non Af Amer: >60 mL/min (ref >60)
Glucose, Bld: 95 mg/dL (ref 70–99)
Potassium: 3.5 mmol/L (ref 3.5–5.1)
Sodium: 138 mmol/L (ref 135–145)
Total Bilirubin: 0.6 mg/dL (ref 0.3–1.2)
Total Protein: 5.1 g/dL — ABNORMAL LOW (ref 6.5–8.1)

## 2019-09-18 LAB — CBC
HCT: 27.2 % — ABNORMAL LOW (ref 36.0–46.0)
Hemoglobin: 8.2 g/dL — ABNORMAL LOW (ref 12.0–15.0)
MCH: 27.8 pg (ref 26.0–34.0)
MCHC: 30.1 g/dL (ref 30.0–36.0)
MCV: 92.2 fL (ref 80.0–100.0)
Platelets: 230 10*3/uL (ref 150–400)
RBC: 2.95 MIL/uL — ABNORMAL LOW (ref 3.87–5.11)
RDW: 15.9 % — ABNORMAL HIGH (ref 11.5–15.5)
WBC: 5.7 10*3/uL (ref 4.0–10.5)
nRBC: 0 % (ref 0.0–0.2)

## 2019-09-18 LAB — MAGNESIUM: Magnesium: 2 mg/dL (ref 1.7–2.4)

## 2019-09-18 LAB — PHOSPHORUS: Phosphorus: 2.1 mg/dL — ABNORMAL LOW (ref 2.5–4.6)

## 2019-09-18 LAB — C-REACTIVE PROTEIN: CRP: <0.5 mg/dL (ref <1.0)

## 2019-09-18 MED ORDER — HEPARIN SOD (PORK) LOCK FLUSH 100 UNIT/ML IV SOLN
250.0000 [IU] | INTRAVENOUS | Status: AC | PRN
  Administered 2019-09-18: 250 [IU]
  Filled 2019-09-18: qty 2.5

## 2019-09-18 MED ORDER — HYDROCODONE-ACETAMINOPHEN 5-325 MG PO TABS: 2.0000 | ORAL_TABLET | Freq: Four times a day (QID) | ORAL | 0 refills | Status: DC | PRN

## 2019-09-18 MED ORDER — POTASSIUM CHLORIDE CRYS ER 20 MEQ PO TBCR: 20.0000 meq | EXTENDED_RELEASE_TABLET | Freq: Two times a day (BID) | ORAL | 0 refills | Status: DC

## 2019-09-18 MED ORDER — MEROPENEM IV (FOR PTA / DISCHARGE USE ONLY): 1.0000 g | Freq: Three times a day (TID) | INTRAVENOUS | 0 refills | Status: AC

## 2019-09-18 MED ORDER — SODIUM CHLORIDE 0.9 % IV SOLN: 1.0000 g | Freq: Three times a day (TID) | INTRAVENOUS | Status: DC

## 2019-09-18 MED ORDER — TAMSULOSIN HCL 0.4 MG PO CAPS: 0.4000 mg | ORAL_CAPSULE | Freq: Every day | ORAL | 0 refills | Status: DC

## 2019-09-18 MED ORDER — POLYETHYLENE GLYCOL 3350 17 G PO PACK: 17.0000 g | PACK | Freq: Every day | ORAL | 0 refills | Status: DC

## 2019-09-18 MED FILL — TAMSULOSIN HCL 0.4 MG CAP: 0.4 | 30 days supply | Qty: 30 | Fill #0

## 2019-09-18 MED FILL — POTASSIUM CHLORIDE CRYS ER: 20 | 30 days supply | Qty: 60 | Fill #0

## 2019-09-18 NOTE — Discharge Summary (Signed)
Physician Discharge Summary  Patient ID: ETHYLENE REZNICK MRN: [id number] DOB/AGE: Jul 26, 1946 74 y.o.  Admit date: 09/10/2019 Discharge date: 09/18/2019  Admission Diagnoses: Acute pyelonephritis due to ESBL E. 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  No report of fever or chills on presentation.  On presentation she was found to have acute kidney injury, urinary retention.  Foley was placed in the emergency department.  Started on antibiotic for urinary tract infection.  Admitted for the management of urinary tract infection, dehydration, AKI.  09/12/2019: Acute kidney injury has resolved.  Urine culture is growing gram-negative rods.  Final cultures are pending.  We will continue antibiotics for now.  Patient also had fecal impaction.  Patient has had an enema as per collateral information.  Repeat abdominal KUB reveals normal gas pattern.  Physical therapy input is appreciated, CIR is recommended.  Will consult rehab medicine team.  Updated patient's husband extensively.  All questions were answered.  09/13/2019: Patient seen.  Patient feels a lot better today.  Lose/watery stool reported today.  No fever or chills.  No abdominal pain.  Patient received recent enema.  Urine culture final result is still pending.  We will continue antibiotics for now.  Patient deemed not a candidate for CIR.  Patient is awaiting skilled nursing facility placement.  Hopefully, patient will be discharged once a bed is  available.  Social work input is appreciated.  09/14/2019: Patient seen.  No new changes.  Patient has remained stable.  Discussed with the infectious disease team over the phone, Dr. Karolee Ohs, patient will complete 10-day course of IV meropenem.  Midline ordered.  Hopefully, patient will be discharged tomorrow to skilled nursing facility. 09/15/2019.  Discussed with case worker and both facilities are refusing to accept patient due to being on meropenem, which according to them is expensive to procure Facility will also prefer PICC line for long-term antibiotic therapy rather than midline. Case worker working on alternative placement arrangement. 09/16/2019: No SNF is open to accepting patient with IV meropenem, contact isolation.  Patient is on day 6 of 10 on IV meropenem.   ID recommendation 10 days of meropenem for ESBL E. coli UTI 09/17/2019: No new complaints today patient on day 5 of 10 on meropenem. Patient appears to have been accepted to SNF at [facility]. Patient had a single-lumen left brachial PICC line placed on 09/17/2019 She is stable for discharge to [facility] today 09/18/2019   Principal Problem:   Pyelonephritis Active Problems:   Primary cancer of right upper lobe of lung ([place])   Urinary retention   1.  Suspected pyonephritis/urinary tract infection: Presented with dysuria.  Has history of frequent UTI.  UA was grossly suggestive of urinary tract infection. CT abdomen/pelvis showed small left renal calculi but no obstructing ureteral calculi or bladder calculi,mild mucosal thickening of the left renal collecting system and possible areas of ureteral wall.Suspected  Pyonephrosis. Continue ceftriaxone for now.  Urine culture, blood culture should be followed.  Currently she is hemodynamically stable, afebrile.  No  leukocytosis 09/13/2019: Follow final cultures.  Continue antibiotics for now. 09/14/2019: Urine culture grew E. coli, ESBL positive.  Patient  is currently on IV meropenem.  IV meropenem was started on 09/13/2019.[name]   Patient will need to complete 10-day course of IV meropenem, EOT with end of treatment on 09/22/2019   09/16/2019.  No facility is ready to accept patient with IV meropenem which is considered expensive to Procure.  Patient is on day 4 of 10 on meropenem. She will continue with meropenem for now while on admission. 09/17/2019.  Day 5 of 10 on meropenem PICC line was placed is to continue with meropenem. She is day 6 of 10 today. Based on 09/22/2019  Urinary retention: Foley was placed in the emergency department.  The bladder was not significantly distended as per CT scan so we will give her a voiding trial.  Start on tamsulosin.  Might need to reinsert the Foley if she retains.  She has history of urinary retention in the past  09/12/2019: Follow with urology on discharge.  Patient is known to the urology team.  Complete treatment for ESBL UTI with meropenem   Follow-up with urology on discharge.  AKI: Baseline creatinine normal.  Kidney function has almost returned to baseline.  Continue gentle IV fluids for today.  Consider stopping fluids tomorrow. 09/12/2019: Resolved.  Debility/deconditioning: She is mostly bedbound.  She had a fracture of hip in October last year.  History of frequent falls .CT imaging showed new T10 compression fracture since February 2019,remote compression fractures of L1 and L4.  She does not complain of severe back pain so kyphoplasty is not necessary at this point.  PT/OT requested SNF   Normocytic anemia: Most likely associated  with anemia of chronic disease.  Continue to monitor CBC  Non-small cell right lung cancer: Currently in remission.  Status post lobectomy.  Follows with Dr. Julien Nordmann.  On oral chemotherapy with Tagrisso.  History of brain metastasis, with resection.  History of dementia: Currently alert and oriented.  On Aricept from home.  History of DVT: Continue  Xarelto.  Constipation:  Continue bowel regimen 09/12/2019: Repeat abdominal KUB is nonrevealing.  Consults:  Infectious disease-phone discussion  Significant Diagnostic Studies: Urinalysis/culture. Culture grew ESBL E. coli  Treatments: Currently on meropenem day 6 of 10  Discharge Exam: Blood pressure 140/65, pulse 93, temperature 97.8 F (36.6 C), temperature source Oral, resp. rate 17, height 5\' 9"  (1.753 m), weight 81.6 kg, SpO2 97 %. General appearance: alert, cooperative and no distress Nose: Nares normal. Septum midline. Mucosa normal. No drainage or sinus tenderness. Neck: no adenopathy, no carotid bruit, no JVD, supple, symmetrical, trachea midline and thyroid not enlarged, symmetric, no tenderness/mass/nodules Resp: clear to auscultation bilaterally Cardio: regular rate and rhythm, S1, S2 normal, no murmur, click, rub or gallop GI: soft, non-tender; bowel sounds normal; no masses,  no organomegaly Extremities: extremities normal, atraumatic, no cyanosis or edema and Bilateral lower extremities weakness Pulses: 2+ and symmetric Skin: Skin color, texture, turgor normal. No rashes or lesions Neurologic: Alert and oriented X 3, normal strength and tone. Normal symmetric reflexes. Normal coordination and gait Motor: Decreased lower extremity motor  Disposition: Discharge disposition: 03-Skilled Nursing Facility       Discharge Instructions    Diet - low sodium heart healthy   Complete by: As directed    Home infusion instructions   Complete by: As directed    Instructions: Flushing of vascular access device: 0.9% NaCl pre/post medication administration and prn patency;  Heparin 100 u/ml, 58ml for implanted ports and Heparin 10u/ml, 71ml for all other central venous catheters.   Increase activity slowly   Complete by: As directed      Allergies as of 09/18/2019   No Known Allergies     Medication List    STOP taking these medications   ibuprofen 200 MG  tablet Commonly known as: ADVIL     TAKE these medications   acetaminophen 325 MG tablet Commonly known as: TYLENOL Take 2 tablets (650 mg total) by mouth every 4 (four) hours as needed for mild pain, fever or headache.   cholecalciferol 1000 units tablet Commonly known as: VITAMIN D Take 1,000 Units by mouth every evening.   donepezil 10 MG tablet Commonly known as: ARICEPT Take 1 tablet (10 mg total) by mouth every evening.   HYDROcodone-acetaminophen 5-325 MG tablet Commonly known as: NORCO/VICODIN Take 2 tablets by mouth every 6 (six) hours as needed for moderate pain or severe pain.   loperamide 2 MG capsule Commonly known as: IMODIUM Take 2 mg by mouth as needed for diarrhea or loose stools.   MAGNESIUM PO Take 1 tablet by mouth daily.   meropenem  IVPB Commonly known as: MERREM Inject 1 g into the vein every 8 (eight) hours for 7 days. Indication: ESBL E. Coli pyelonephritis Last Day of Therapy:  09/22/2019 Labs - Once weekly:  CBC/D and BMP   meropenem 1 g in sodium chloride 0.9 % 100 mL Inject 1 g into the vein every 8 (eight) hours.   methocarbamol 500 MG tablet Commonly known as: ROBAXIN Take 1 tablet (500 mg total) by mouth every 6 (six) hours as needed for muscle spasms.   Multi-Vitamins Tabs Take 1 tablet by mouth every evening.   polyethylene glycol 17 g packet Commonly known as: MIRALAX / GLYCOLAX Take 17 g by mouth daily. Start taking on: September 19, 2019   potassium chloride SA 20 MEQ tablet Commonly known as: KLOR-CON Take 1 tablet (20 mEq total) by mouth 2 (two) times daily.   Tagrisso 80 MG tablet Generic drug: osimertinib mesylate TAKE 1 TABLET (80MG ) BY MOUTH DAILY.   tamsulosin 0.4 MG Caps capsule Commonly known as: FLOMAX Take 1 capsule (0.4 mg total) by mouth daily after supper.   vitamin B-12 1000 MCG tablet Commonly known as: CYANOCOBALAMIN Take 1,000 mcg by mouth every evening.   vitamin C 100 MG tablet Take 100 mg by mouth  every evening. Gummie   Xarelto 20 MG Tabs tablet Generic drug: rivaroxaban TAKE 1 TABLET BY MOUTH ONCE DAILY IN THE EVENING What changed:   how much to take  when to take this            Home Infusion Instuctions  (From admission, onward)         Start     Ordered   09/18/19 0000  Home infusion instructions    Question:  Instructions  Answer:  Flushing of vascular access device: 0.9% NaCl pre/post medication administration and prn patency; Heparin 100 u/ml, 76ml for implanted ports and Heparin 10u/ml, 57ml for all other central venous catheters.   09/18/19 1221         Total time spent on this discharge encounter is 42 minutes  Signed: Elie Confer 09/18/2019, 12:30 PM

## 2019-09-18 NOTE — Plan of Care (Signed)
  Problem: Education: Goal: Knowledge of General Education information will improve Description: Including pain rating scale, medication(s)/side effects and non-pharmacologic comfort measures Outcome: Adequate for Discharge   Problem: Health Behavior/Discharge Planning: Goal: Ability to manage health-related needs will improve Outcome: Adequate for Discharge   Problem: Clinical Measurements: Goal: Ability to maintain clinical measurements within normal limits will improve Outcome: Adequate for Discharge Goal: Will remain free from infection Outcome: Adequate for Discharge Goal: Diagnostic test results will improve Outcome: Adequate for Discharge Goal: Respiratory complications will improve Outcome: Adequate for Discharge Goal: Cardiovascular complication will be avoided Outcome: Adequate for Discharge   Problem: Activity: Goal: Risk for activity intolerance will decrease Outcome: Adequate for Discharge   Problem: Nutrition: Goal: Adequate nutrition will be maintained Outcome: Adequate for Discharge   Problem: Coping: Goal: Level of anxiety will decrease Outcome: Adequate for Discharge   Problem: Elimination: Goal: Will not experience complications related to bowel motility Outcome: Adequate for Discharge Goal: Will not experience complications related to urinary retention Outcome: Adequate for Discharge   Problem: Pain Managment: Goal: General experience of comfort will improve Outcome: Adequate for Discharge   Problem: Safety: Goal: Ability to remain free from injury will improve Outcome: Adequate for Discharge   Problem: Skin Integrity: Goal: Risk for impaired skin integrity will decrease Outcome: Adequate for Discharge   Problem: Acute Rehab PT Goals(only PT should resolve) Goal: Pt Will Go Supine/Side To Sit Outcome: Adequate for Discharge Goal: Pt Will Go Sit To Supine/Side Outcome: Adequate for Discharge Goal: Patient Will Transfer Sit To/From  Stand Outcome: Adequate for Discharge Goal: Pt Will Transfer Bed To Chair/Chair To Bed Outcome: Adequate for Discharge Goal: Pt Will Perform Standing Balance Or Pre-Gait Outcome: Adequate for Discharge   Problem: Acute Rehab OT Goals (only OT should resolve) Goal: Pt. Will Perform Upper Body Dressing Outcome: Adequate for Discharge Goal: Pt. Will Perform Lower Body Dressing Outcome: Adequate for Discharge Goal: Pt. Will Transfer To Toilet Outcome: Adequate for Discharge Goal: Pt. Will Perform Toileting-Clothing Manipulation Outcome: Adequate for Discharge Goal: OT Additional ADL Goal #1 Outcome: Adequate for Discharge

## 2019-09-18 NOTE — TOC Transition Note (Signed)
Transition of Care [facility]) - CM/SW Discharge Note   Patient Details  Name: Valerie [name] MRN: [id number] Date of Birth: 11-10-1945  Transition of Care [facility]) CM/SW Contact:  Lia Hopping, [place] Phone Number: 09/18/2019, 11:17 AM   Clinical Narrative:    Patient and spouse notified of SNF request for 2:00pm transfer.  Nurse call report: [phone] Room 141 PTAR arranged for transport.    Final next level of care: Other (comment)(pending) Barriers to Discharge: Barriers Resolved   Patient Goals and CMS Choice Patient states their goals for this hospitalization and ongoing recovery are:: "I would like to be able to stand and walk" CMS Medicare.gov Compare Post Acute Care list provided to:: Patient Choice offered to / list presented to : Patient  Discharge Placement   Existing PASRR number confirmed : 09/14/19            Patient to be transferred to facility by: [place] Name of family member notified: Spouse Patient and family notified of of transfer: 09/18/19  Discharge Plan and Services In-house Referral: Clinical Social Work   Post Acute Care Choice: Skilled Nursing Facility(patient would like to go to a rehab following her hospitalization, preferably [facility])          DME Arranged: 3-N-1                    Social Determinants of Health (SDOH) Interventions     Readmission Risk Interventions No flowsheet data found.

## 2019-09-20 LAB — GI PATHOGEN PANEL BY PCR, STOOL

## 2019-09-21 DIAGNOSIS — N179 Acute kidney failure, unspecified: Secondary | ICD-10-CM | POA: Diagnosis not present

## 2019-09-21 DIAGNOSIS — G610 Guillain-Barre syndrome: Secondary | ICD-10-CM | POA: Diagnosis not present

## 2019-09-21 DIAGNOSIS — R339 Retention of urine, unspecified: Secondary | ICD-10-CM | POA: Diagnosis not present

## 2019-09-21 DIAGNOSIS — I10 Essential (primary) hypertension: Secondary | ICD-10-CM | POA: Diagnosis not present

## 2019-09-22 DIAGNOSIS — L89610 Pressure ulcer of right heel, unstageable: Secondary | ICD-10-CM | POA: Diagnosis not present

## 2019-09-25 DIAGNOSIS — L293 Anogenital pruritus, unspecified: Secondary | ICD-10-CM | POA: Diagnosis not present

## 2019-09-27 IMAGING — MR MR HEAD WO/W CM
9 of 13 series · 34 of 48 positions shown · IV contrast (Yes)
Comparison: 08/06/2016. 06/08/2015.

CLINICAL DATA: Small cell lung cancer, staging.

EXAM:
MRI HEAD WITHOUT AND WITH CONTRAST
TECHNIQUE: Multiplanar, multiecho pulse sequences of the brain and surrounding
structures were obtained without and with intravenous contrast.
CONTRAST:  17mL MULTIHANCE GADOBENATE DIMEGLUMINE 529 MG/ML IV SOLN

[Series 4: DWI · axial · 3.0mm · 1.09mm/px · z∈[-46,+91]mm · 8 of 100 slices shown (1 of 4)]
[im 1/100]
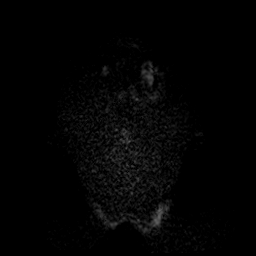
[im 15/100]
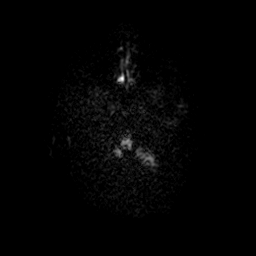
[im 29/100]
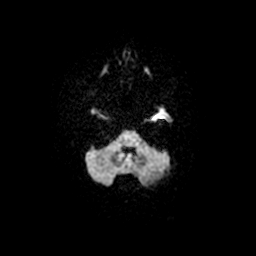
[im 43/100]
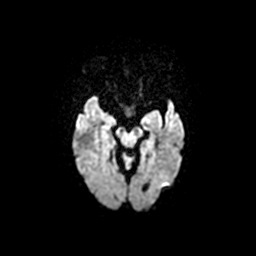
[im 57/100]
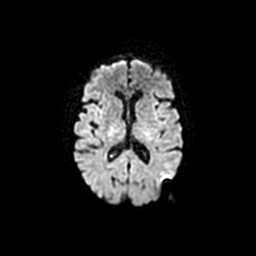
[im 71/100]
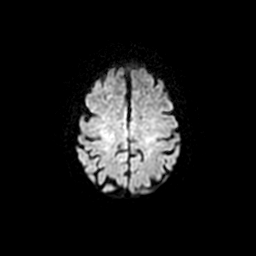
[im 85/100]
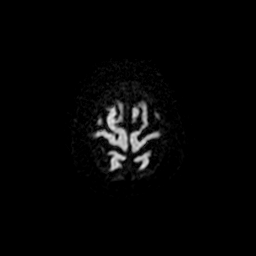
[im 100/100]
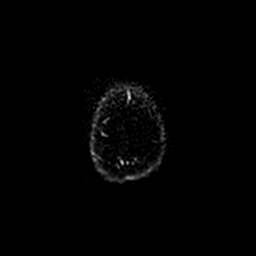

[Series 6: DWI · coronal · 5.0mm · 1.09mm/px · 7 of 76 slices shown (2 of 4)]
[im 1/76]
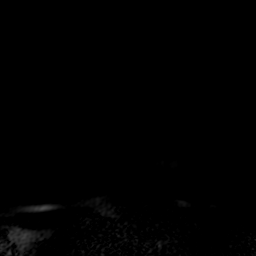
[im 13/76]
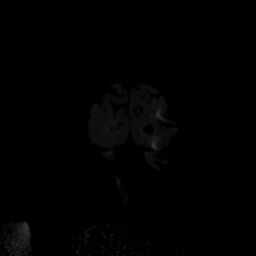
[im 26/76]
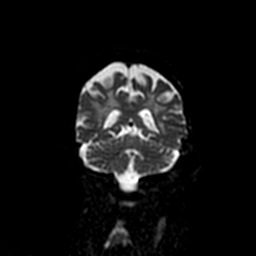
[im 38/76]
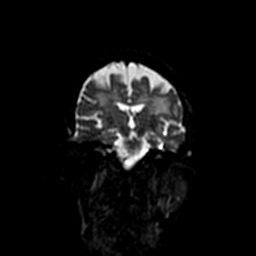
[im 51/76]
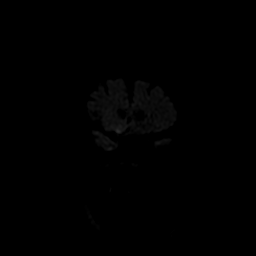
[im 63/76]
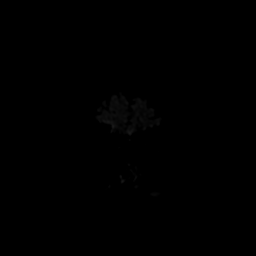
[im 76/76]
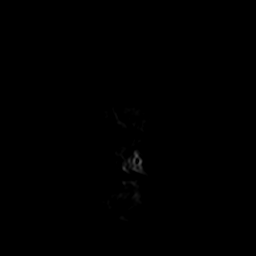

[Series 7: T2 · axial · 5.0mm · 0.43mm/px · z∈[-57,+79]mm · 2 of 22 slices shown]
[im 1/22]
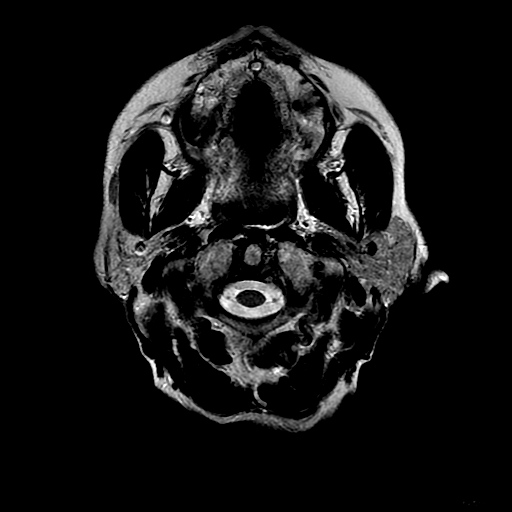
[im 22/22]
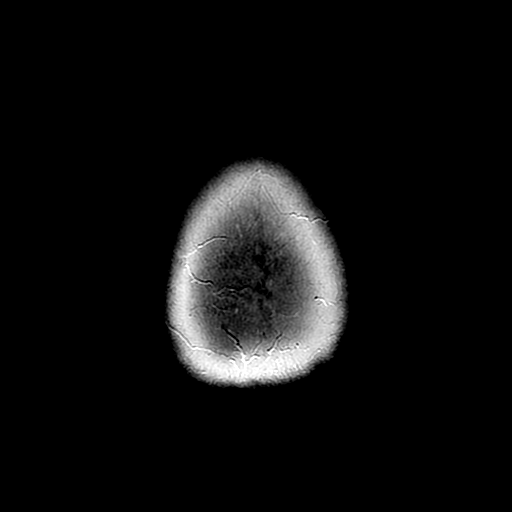

[Series 8: FLAIR · axial · 3.0mm · 0.43mm/px · z∈[-59,+81]mm · 2 of 26 slices shown]
[im 1/26]
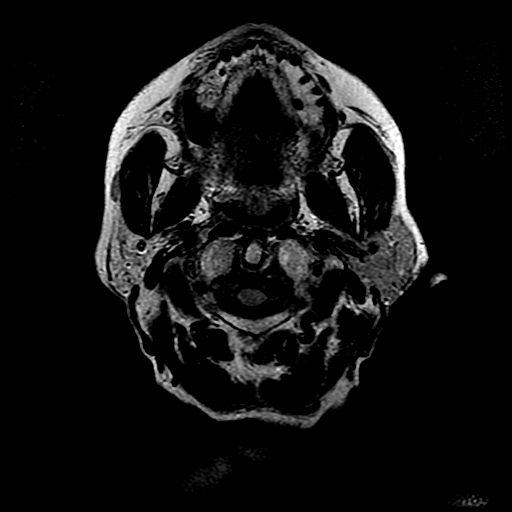
[im 26/26]
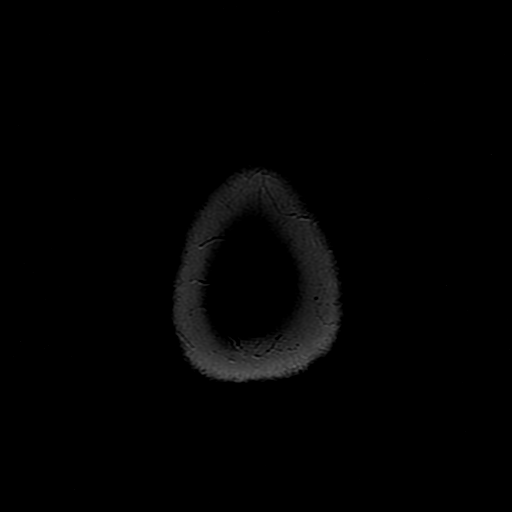

[Series 11: T2 post-contrast · coronal · 5.0mm · 0.45mm/px · 3 of 30 slices shown]
[im 1/30]
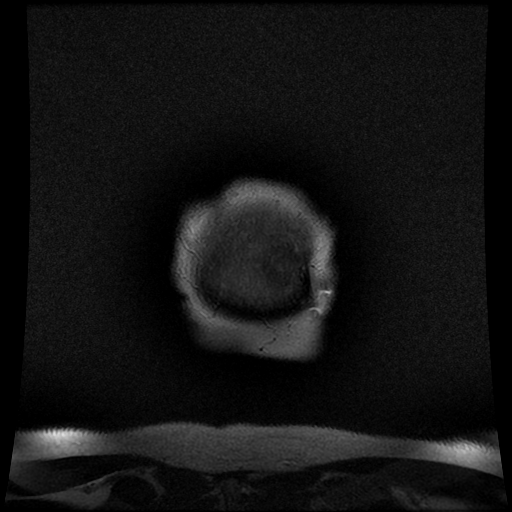
[im 15/30]
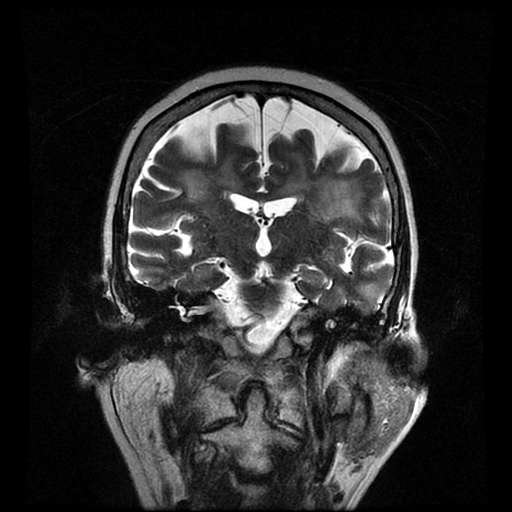
[im 30/30]
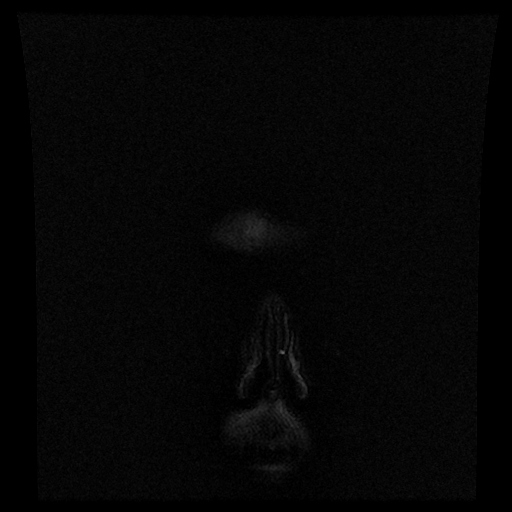

[Series 13: T1 post-contrast · coronal · 5.0mm · 0.45mm/px · 3 of 30 slices shown (1 of 2)]
[im 1/30]
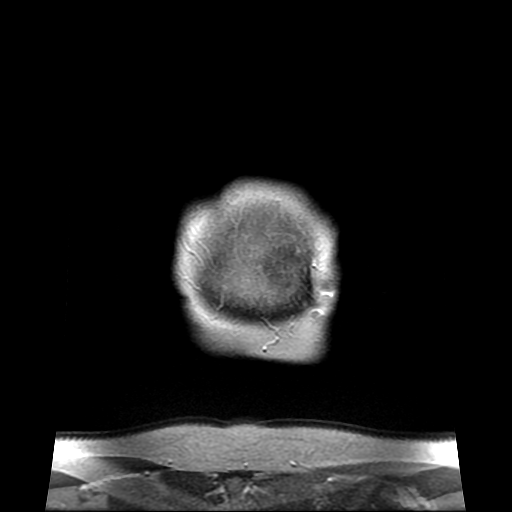
[im 15/30]
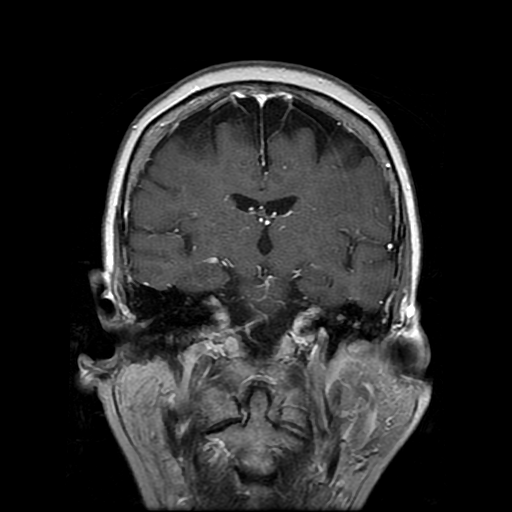
[im 30/30]
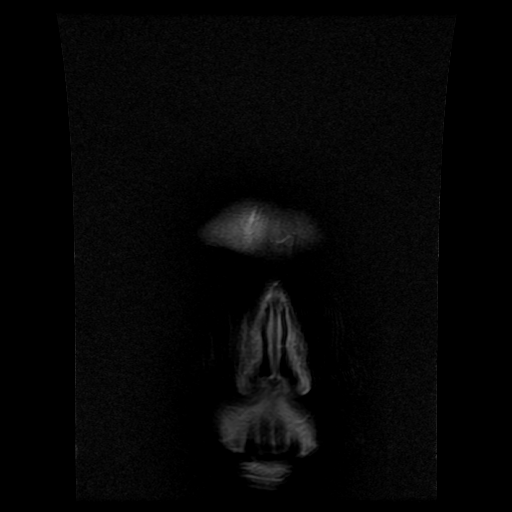

[Series 14: T1 post-contrast · sagittal · 5.0mm · 0.47mm/px · 2 of 24 slices shown (2 of 2)]
[im 1/24]
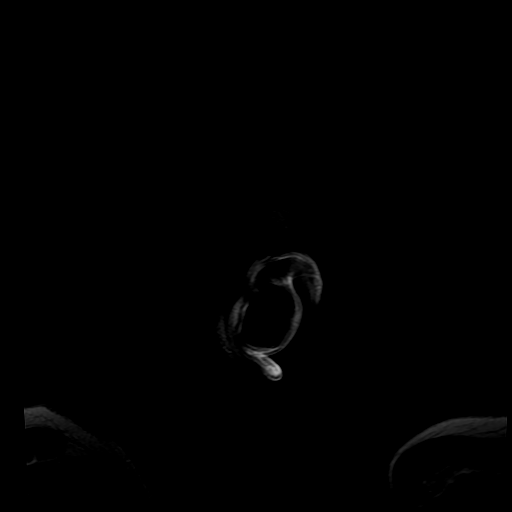
[im 24/24]
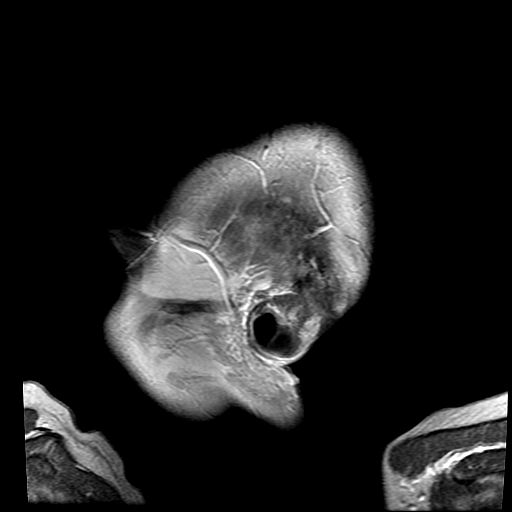

[Series 400: DWI · axial · 3.0mm · 1.09mm/px · z∈[-46,+91]mm · 4 of 50 slices shown (3 of 4)]
[im 1/50]
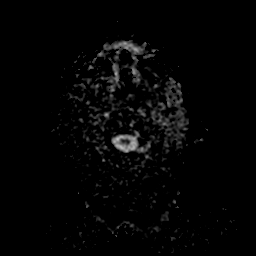
[im 17/50]
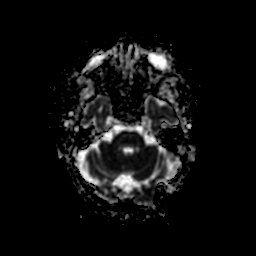
[im 33/50]
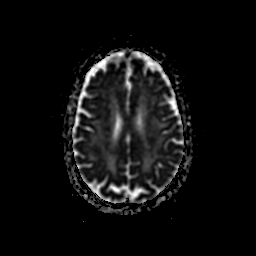
[im 50/50]
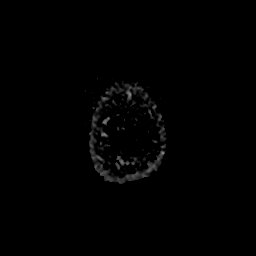

[Series 600: DWI · coronal · 5.0mm · 1.09mm/px · 3 of 38 slices shown (4 of 4)]
[im 1/38]
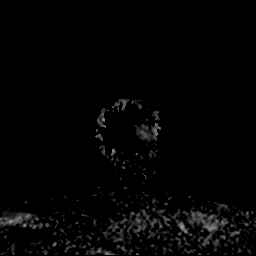
[im 19/38]
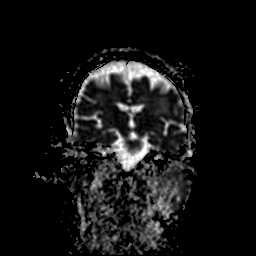
[im 38/38]
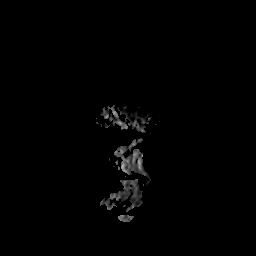

[34 of 48 positions shown; findings below may reference images not displayed]

FINDINGS: Study was performed on a [DATE] tesla scanner.

Brain: No evidence for acute infarction, hemorrhage, mass lesion,
hydrocephalus, or extra-axial fluid. Generalized cerebral and
cerebellar atrophy. Extensive T2 and FLAIR hyperintensities
throughout the white matter, likely combination of small vessel
disease and post treatment effect.

Status post LEFT occipital craniotomy for brain tumor removal.
Previous enhancing nodule at the surgical site is redemonstrated, 4
mm in longest dimension, decreased from priors. No new areas of
concern. Dural enhancement, expected postoperative finding.

Vascular: Normal flow voids.

Skull and upper cervical spine: Normal marrow signal.

Sinuses/Orbits: Negative.

Other: Trace mastoid effusions.
IMPRESSION: Postsurgical changes, LEFT occipital lobe. Enhancing nodular density
measures 4 mm, involuted from most recent priors, consistent with
granulation tissue/scarring.

No new areas of concern to suggest interval metastatic disease.

Advanced atrophy with extensive white matter signal abnormality
likely combination of small vessel disease and post treatment
effect.

## 2019-09-28 DIAGNOSIS — C3490 Malignant neoplasm of unspecified part of unspecified bronchus or lung: Secondary | ICD-10-CM | POA: Diagnosis not present

## 2019-09-28 DIAGNOSIS — N12 Tubulo-interstitial nephritis, not specified as acute or chronic: Secondary | ICD-10-CM | POA: Diagnosis not present

## 2019-09-28 DIAGNOSIS — R339 Retention of urine, unspecified: Secondary | ICD-10-CM | POA: Diagnosis not present

## 2019-09-29 DIAGNOSIS — E860 Dehydration: Secondary | ICD-10-CM | POA: Diagnosis not present

## 2019-09-29 DIAGNOSIS — Z959 Presence of cardiac and vascular implant and graft, unspecified: Secondary | ICD-10-CM | POA: Diagnosis not present

## 2019-09-29 DIAGNOSIS — L89610 Pressure ulcer of right heel, unstageable: Secondary | ICD-10-CM | POA: Diagnosis not present

## 2019-09-30 IMAGING — CT CT ABD-PELV W/ CM
4 of 9 series · 15 of 36 positions shown, 17 images · IV contrast (APPLIED)
Comparison: 04/02/2017.

CLINICAL DATA: Metastatic lung cancer with brain metastases

EXAM:
CT CHEST, ABDOMEN, AND PELVIS WITH CONTRAST
TECHNIQUE: Multidetector CT imaging of the chest, abdomen and pelvis was
performed following the standard protocol during bolus
administration of intravenous contrast.
CONTRAST:  100 cc Isovue 300

[Series 2: cap with · axial · 0.79mm/px · z∈[-495,-55]mm · 5 of 134 slices shown, 7 images]
[im 23/134  mediastinal]
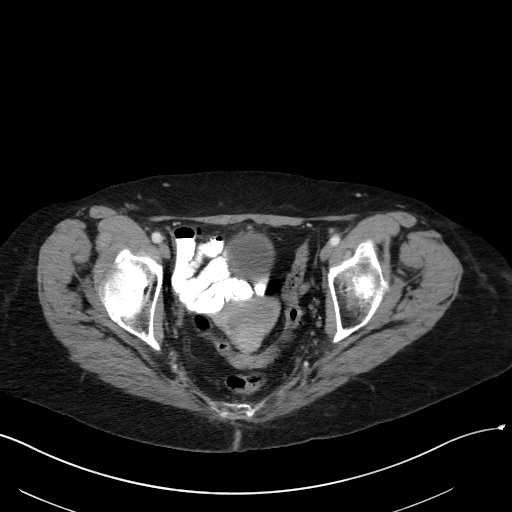
[im 23/134  lung]
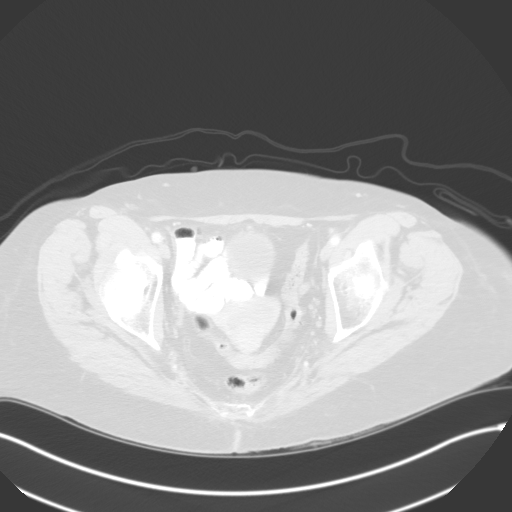
[im 45/134  lung]
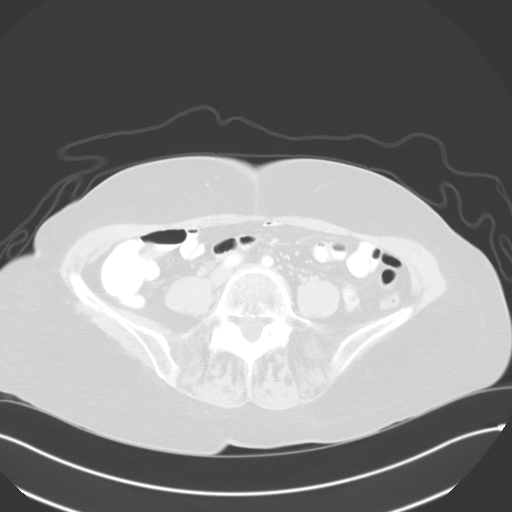
[im 67/134  lung]
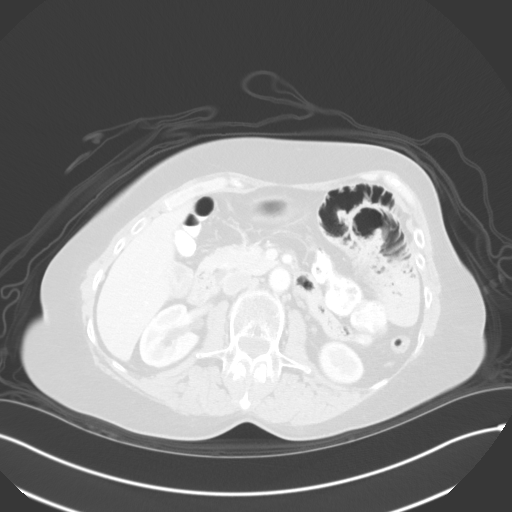
[im 89/134  lung]
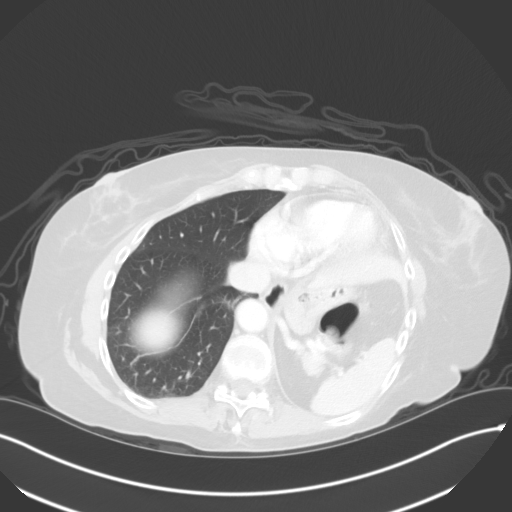
[im 111/134  mediastinal]
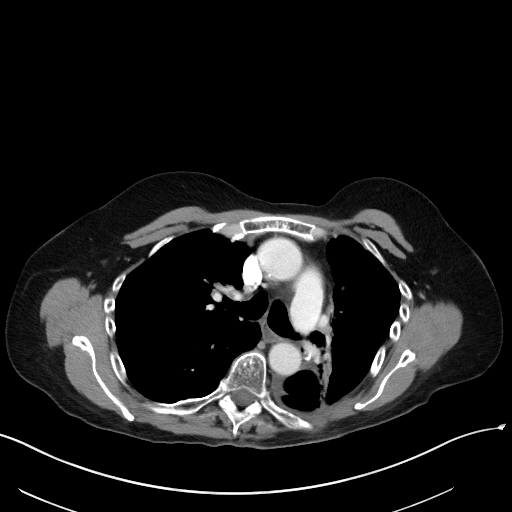
[im 111/134  lung]
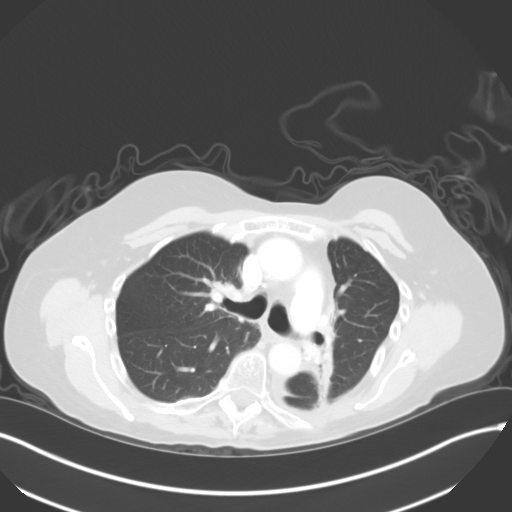

[Series 6: lung · axial · 0.79mm/px · z∈[-180,+2]mm · 5 of 137 slices shown]
[im 23/137  lung]
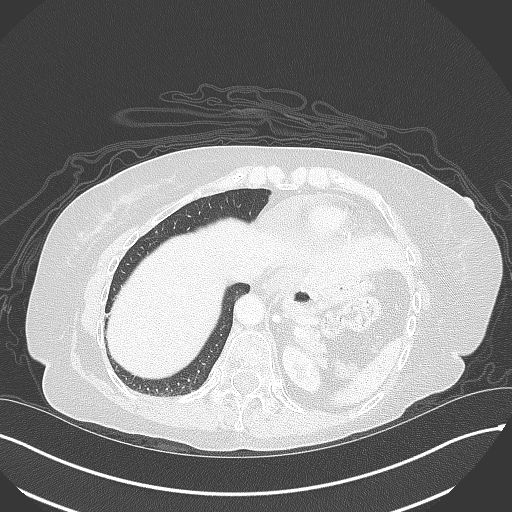
[im 46/137  lung]
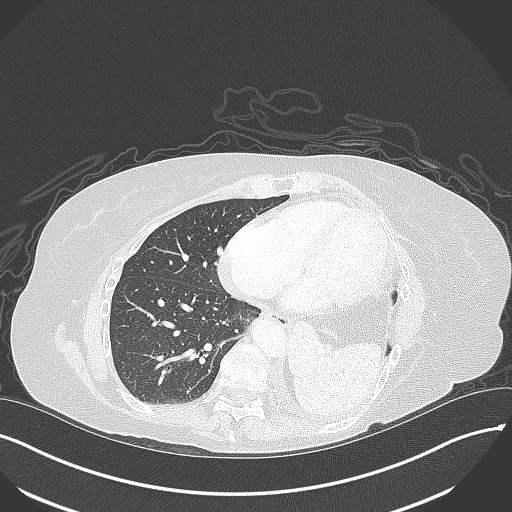
[im 69/137  lung]
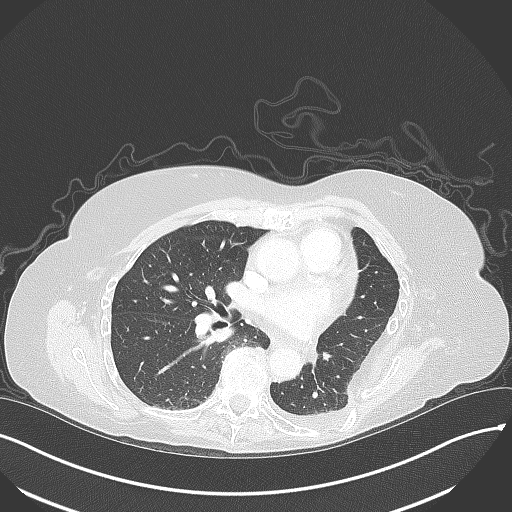
[im 91/137  lung]
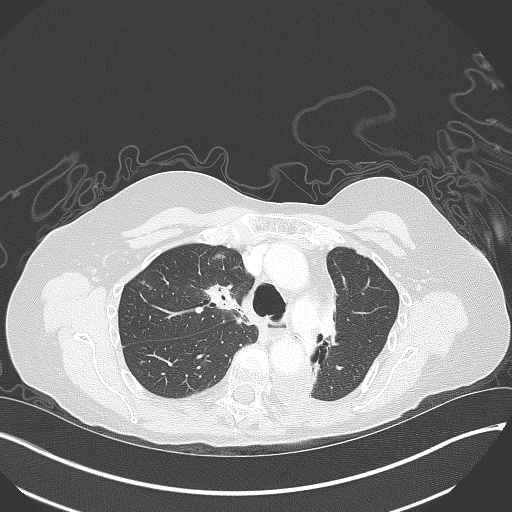
[im 114/137  lung]
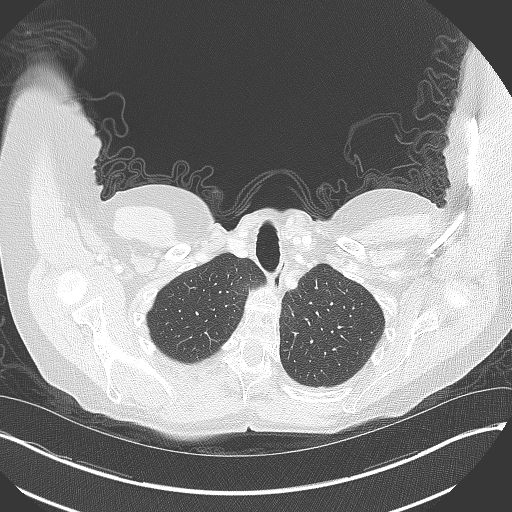

[Series 11: orthogonal ax · axial · 0.43mm/px · z∈[-58,+55]mm · 4 of 123 slices shown]
[im 21/123  lung]
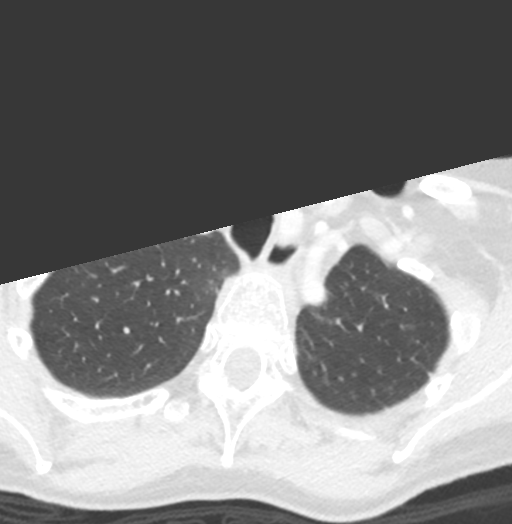
[im 41/123  lung]
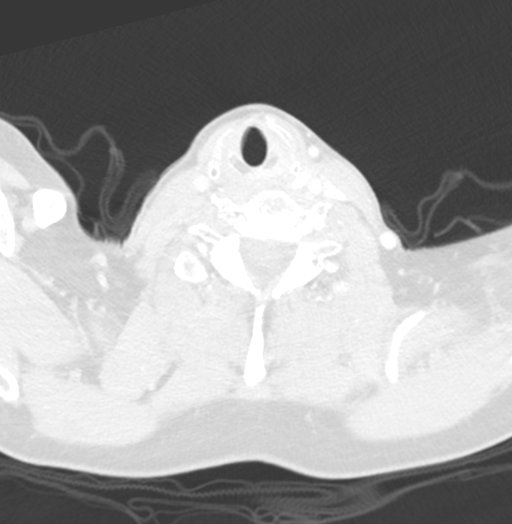
[im 62/123  lung]
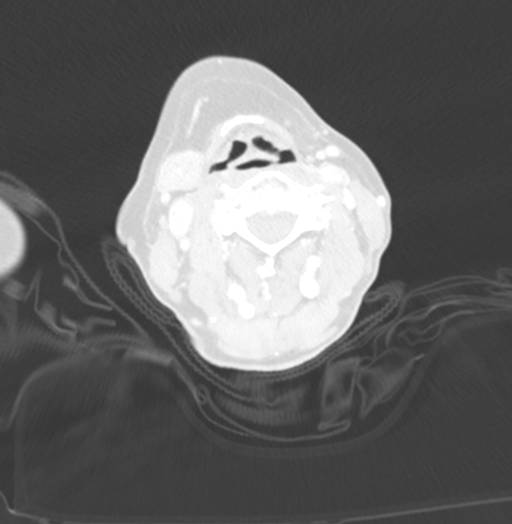
[im 82/123  lung]
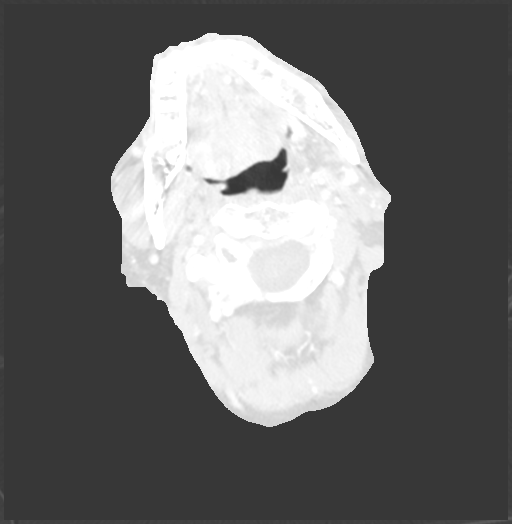

[Series 12: cor neck · coronal · 0.45mm/px · 1 of 132 slices shown]
[im 66/132  lung]
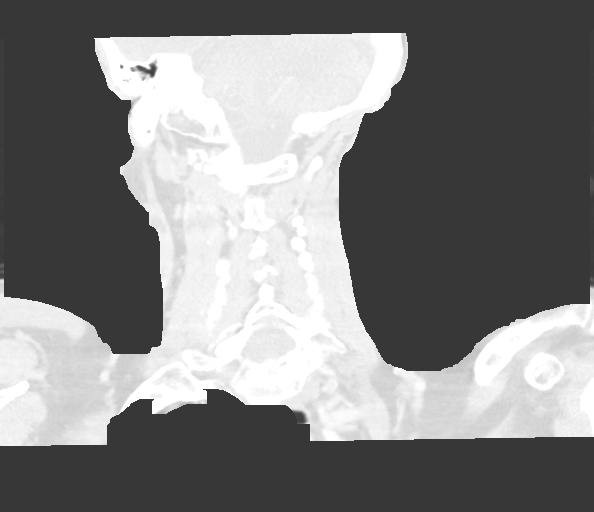

[15 of 36 positions shown; findings below may reference images not displayed]

FINDINGS: CT CHEST FINDINGS

Cardiovascular: Heart is enlarged. Coronary artery calcification is
evident. Atherosclerotic calcification is noted in the wall of the
thoracic aorta.

Mediastinum/Nodes: No mediastinal lymphadenopathy. Patulous
esophagus again noted. There is no hilar lymphadenopathy. Surgical
changes evident in the left hilum. There is no axillary
lymphadenopathy.

Lungs/Pleura: Soft tissue attenuation in the right suprahilar region
with airway impaction is similar to prior. Peripheral right upper
lobe nodule on image 46 is stable. 3 mm right lower lobe nodule
(image 90) is unchanged. Stable 5 mm left lung nodule on image 69
anterior left lung nodule seen on image 44 measures about 6 mm,
stable. Scarring and radiation change noted left parahilar lung, as
before tiny chronic left pleural effusions similar to prior.

Musculoskeletal: Bone windows reveal no worrisome lytic or sclerotic
osseous lesions.

CT ABDOMEN PELVIS FINDINGS

Hepatobiliary: No focal abnormality within the liver parenchyma.
There is no evidence for gallstones, gallbladder wall thickening, or
pericholecystic fluid. No intrahepatic or extrahepatic biliary
dilation.

Pancreas: No focal mass lesion. No dilatation of the main duct. No
intraparenchymal cyst. No peripancreatic edema.

Spleen: No splenomegaly. No focal mass lesion.

Adrenals/Urinary Tract: No adrenal nodule or mass. Kidneys
unremarkable. Duplicated left intrarenal collecting system is
associated with at least partial duplication of the left ureter. The
urinary bladder appears normal for the degree of distention.

Stomach/Bowel: Stomach is nondistended. No gastric wall thickening.
No evidence of outlet obstruction. Duodenum is normally positioned
as is the ligament of Treitz. No small bowel wall thickening. No
small bowel dilatation. The terminal ileum is normal. The appendix
is normal. No gross colonic mass. No colonic wall thickening. No
substantial diverticular change.

Vascular/Lymphatic: No abdominal aortic aneurysm. There is no
gastrohepatic or hepatoduodenal ligament lymphadenopathy. No
intraperitoneal or retroperitoneal lymphadenopathy. No pelvic
sidewall lymphadenopathy.

Reproductive: The globular uterine configuration with heterogeneous
attenuation. Endometrial fluid not excluded. There is no adnexal
mass.

Other: No intraperitoneal free fluid.

Musculoskeletal: Bone windows reveal no worrisome lytic or sclerotic
osseous lesions. Stable appearance mild superior endplate
compression deformity at L4. New L1 compression fracture evident.
IMPRESSION: 1. Stable exam of the chest. Postsurgical/post treatment changes in
the left lung, stable. Small chronic left pleural effusion similar
to prior.
2. Bilateral pulmonary nodules are similar to prior. No new or
progressive pulmonary nodularity.
3. No evidence for metastatic disease in the abdomen or pelvis.
4. Interval development of L1 compression fracture. Stable mild
compression deformity at L4.
5. Similar appearance of globular uterine configuration with
heterogeneous attenuation. Fluid in the endometrial canal not
excluded.

## 2019-10-01 DIAGNOSIS — R197 Diarrhea, unspecified: Secondary | ICD-10-CM | POA: Diagnosis not present

## 2019-10-01 DIAGNOSIS — L293 Anogenital pruritus, unspecified: Secondary | ICD-10-CM | POA: Diagnosis not present

## 2019-10-05 DIAGNOSIS — R197 Diarrhea, unspecified: Secondary | ICD-10-CM | POA: Diagnosis not present

## 2019-10-07 DIAGNOSIS — R197 Diarrhea, unspecified: Secondary | ICD-10-CM | POA: Diagnosis not present

## 2019-10-07 DIAGNOSIS — G610 Guillain-Barre syndrome: Secondary | ICD-10-CM | POA: Diagnosis not present

## 2019-10-07 DIAGNOSIS — N179 Acute kidney failure, unspecified: Secondary | ICD-10-CM | POA: Diagnosis not present

## 2019-10-07 DIAGNOSIS — I10 Essential (primary) hypertension: Secondary | ICD-10-CM | POA: Diagnosis not present

## 2019-10-09 ENCOUNTER — Telehealth: Payer: Self-pay | Admitting: *Deleted

## 2019-10-09 NOTE — Telephone Encounter (Signed)
Received vm message from patient stating that she needs to cancel any future appts/ scans for  Now as she has been in the hospital a couple of times in the last 6 months.She stated she cannot walk right now (s/p hip fracture)  She stated she will call when she can come back in to the cancer center for follow up.

## 2019-10-10 DIAGNOSIS — S82141D Displaced bicondylar fracture of right tibia, subsequent encounter for closed fracture with routine healing: Secondary | ICD-10-CM | POA: Diagnosis not present

## 2019-10-10 DIAGNOSIS — F0390 Unspecified dementia without behavioral disturbance: Secondary | ICD-10-CM | POA: Diagnosis not present

## 2019-10-10 DIAGNOSIS — C3411 Malignant neoplasm of upper lobe, right bronchus or lung: Secondary | ICD-10-CM | POA: Diagnosis not present

## 2019-10-10 DIAGNOSIS — Z79899 Other long term (current) drug therapy: Secondary | ICD-10-CM | POA: Diagnosis not present

## 2019-10-10 DIAGNOSIS — C7931 Secondary malignant neoplasm of brain: Secondary | ICD-10-CM | POA: Diagnosis not present

## 2019-10-10 DIAGNOSIS — Z7901 Long term (current) use of anticoagulants: Secondary | ICD-10-CM | POA: Diagnosis not present

## 2019-10-10 DIAGNOSIS — M810 Age-related osteoporosis without current pathological fracture: Secondary | ICD-10-CM | POA: Diagnosis not present

## 2019-10-10 DIAGNOSIS — S90521D Blister (nonthermal), right ankle, subsequent encounter: Secondary | ICD-10-CM | POA: Diagnosis not present

## 2019-10-10 DIAGNOSIS — S72351D Displaced comminuted fracture of shaft of right femur, subsequent encounter for closed fracture with routine healing: Secondary | ICD-10-CM | POA: Diagnosis not present

## 2019-10-12 ENCOUNTER — Other Ambulatory Visit: Payer: Self-pay

## 2019-10-12 ENCOUNTER — Telehealth: Payer: Self-pay | Admitting: Internal Medicine

## 2019-10-12 DIAGNOSIS — L97419 Non-pressure chronic ulcer of right heel and midfoot with unspecified severity: Secondary | ICD-10-CM

## 2019-10-12 DIAGNOSIS — S72351D Displaced comminuted fracture of shaft of right femur, subsequent encounter for closed fracture with routine healing: Secondary | ICD-10-CM | POA: Diagnosis not present

## 2019-10-12 DIAGNOSIS — C7931 Secondary malignant neoplasm of brain: Secondary | ICD-10-CM | POA: Diagnosis not present

## 2019-10-12 DIAGNOSIS — Z79899 Other long term (current) drug therapy: Secondary | ICD-10-CM | POA: Diagnosis not present

## 2019-10-12 DIAGNOSIS — S82141D Displaced bicondylar fracture of right tibia, subsequent encounter for closed fracture with routine healing: Secondary | ICD-10-CM | POA: Diagnosis not present

## 2019-10-12 DIAGNOSIS — S90521D Blister (nonthermal), right ankle, subsequent encounter: Secondary | ICD-10-CM | POA: Diagnosis not present

## 2019-10-12 DIAGNOSIS — Z7901 Long term (current) use of anticoagulants: Secondary | ICD-10-CM | POA: Diagnosis not present

## 2019-10-12 DIAGNOSIS — C3411 Malignant neoplasm of upper lobe, right bronchus or lung: Secondary | ICD-10-CM | POA: Diagnosis not present

## 2019-10-12 DIAGNOSIS — F0390 Unspecified dementia without behavioral disturbance: Secondary | ICD-10-CM | POA: Diagnosis not present

## 2019-10-12 DIAGNOSIS — M810 Age-related osteoporosis without current pathological fracture: Secondary | ICD-10-CM | POA: Diagnosis not present

## 2019-10-12 NOTE — Telephone Encounter (Signed)
Pt notified that a scheduler will call her with an appt for the end of march.Schedule message sent.

## 2019-10-12 NOTE — Telephone Encounter (Signed)
Scheduled 3/30 appt per 2/22 sch msg. Pt is aware of appt date and time.

## 2019-10-14 ENCOUNTER — Telehealth: Payer: Self-pay

## 2019-10-14 DIAGNOSIS — C3411 Malignant neoplasm of upper lobe, right bronchus or lung: Secondary | ICD-10-CM

## 2019-10-14 DIAGNOSIS — W19XXXA Unspecified fall, initial encounter: Secondary | ICD-10-CM

## 2019-10-14 DIAGNOSIS — N390 Urinary tract infection, site not specified: Secondary | ICD-10-CM

## 2019-10-14 NOTE — Telephone Encounter (Signed)
Transition Care Management Follow-up Telephone Call  Date of discharge and from where: 10/07/2019 [facility]  How have you been since you were released from the hospital?she's having pain with bowel movement and frequent urination recently.  Any questions or concerns? What to have her evaluated to make sure she doesn't have hemorrhoids.  Items Reviewed:  Did the pt receive and understand the discharge instructions provided? Yes  Medications obtained and verified? Yes  Any new allergies since your discharge? No  Dietary orders reviewed? No  Do you have support at home? Yes  Other (ie: DME, Home Health, etc)  No  Functional Questionnaire: (I = Independent and D = Dependent) ADL's: D  Bathing/Dressing- D   Meal Prep- D Eating-D  Maintaining continence- D  Transferring/Ambulation- D  Managing Meds- D   Follow up appointments reviewed:    PCP Hospital f/u appt confirmed? Referral being [facility] the chronic care management team  [facility] f/u appt confirmed? N/A    Are transportation arrangements needed? NO  If their condition worsens, is the pt aware to call  their PCP or go to the ED? Yes  Was the patient provided with contact information for the PCP's office or ED? Yes  Was the pt encouraged to call back with questions or concerns? yes

## 2019-10-15 MED FILL — TAGRISSO 80 MG TABLET: 80 | 30 days supply | Qty: 30 | Fill #2

## 2019-10-16 DIAGNOSIS — Z7901 Long term (current) use of anticoagulants: Secondary | ICD-10-CM | POA: Diagnosis not present

## 2019-10-16 DIAGNOSIS — M810 Age-related osteoporosis without current pathological fracture: Secondary | ICD-10-CM | POA: Diagnosis not present

## 2019-10-16 DIAGNOSIS — S72351D Displaced comminuted fracture of shaft of right femur, subsequent encounter for closed fracture with routine healing: Secondary | ICD-10-CM | POA: Diagnosis not present

## 2019-10-16 DIAGNOSIS — C3411 Malignant neoplasm of upper lobe, right bronchus or lung: Secondary | ICD-10-CM | POA: Diagnosis not present

## 2019-10-16 DIAGNOSIS — S90521D Blister (nonthermal), right ankle, subsequent encounter: Secondary | ICD-10-CM | POA: Diagnosis not present

## 2019-10-16 DIAGNOSIS — Z79899 Other long term (current) drug therapy: Secondary | ICD-10-CM | POA: Diagnosis not present

## 2019-10-16 DIAGNOSIS — S82141D Displaced bicondylar fracture of right tibia, subsequent encounter for closed fracture with routine healing: Secondary | ICD-10-CM | POA: Diagnosis not present

## 2019-10-16 DIAGNOSIS — F0390 Unspecified dementia without behavioral disturbance: Secondary | ICD-10-CM | POA: Diagnosis not present

## 2019-10-16 DIAGNOSIS — C7931 Secondary malignant neoplasm of brain: Secondary | ICD-10-CM | POA: Diagnosis not present

## 2019-10-18 DIAGNOSIS — S72401A Unspecified fracture of lower end of right femur, initial encounter for closed fracture: Secondary | ICD-10-CM | POA: Diagnosis not present

## 2019-10-19 DIAGNOSIS — F0390 Unspecified dementia without behavioral disturbance: Secondary | ICD-10-CM | POA: Diagnosis not present

## 2019-10-19 DIAGNOSIS — M810 Age-related osteoporosis without current pathological fracture: Secondary | ICD-10-CM | POA: Diagnosis not present

## 2019-10-19 DIAGNOSIS — S90521D Blister (nonthermal), right ankle, subsequent encounter: Secondary | ICD-10-CM | POA: Diagnosis not present

## 2019-10-19 DIAGNOSIS — S72351D Displaced comminuted fracture of shaft of right femur, subsequent encounter for closed fracture with routine healing: Secondary | ICD-10-CM | POA: Diagnosis not present

## 2019-10-19 DIAGNOSIS — S82141D Displaced bicondylar fracture of right tibia, subsequent encounter for closed fracture with routine healing: Secondary | ICD-10-CM | POA: Diagnosis not present

## 2019-10-19 DIAGNOSIS — C7931 Secondary malignant neoplasm of brain: Secondary | ICD-10-CM | POA: Diagnosis not present

## 2019-10-19 DIAGNOSIS — C3411 Malignant neoplasm of upper lobe, right bronchus or lung: Secondary | ICD-10-CM | POA: Diagnosis not present

## 2019-10-19 DIAGNOSIS — R339 Retention of urine, unspecified: Secondary | ICD-10-CM | POA: Diagnosis not present

## 2019-10-19 DIAGNOSIS — Z7901 Long term (current) use of anticoagulants: Secondary | ICD-10-CM | POA: Diagnosis not present

## 2019-10-20 ENCOUNTER — Ambulatory Visit: Payer: Self-pay

## 2019-10-20 ENCOUNTER — Telehealth: Payer: Self-pay

## 2019-10-20 DIAGNOSIS — R339 Retention of urine, unspecified: Secondary | ICD-10-CM | POA: Diagnosis not present

## 2019-10-20 DIAGNOSIS — N390 Urinary tract infection, site not specified: Secondary | ICD-10-CM

## 2019-10-20 DIAGNOSIS — C3411 Malignant neoplasm of upper lobe, right bronchus or lung: Secondary | ICD-10-CM | POA: Diagnosis not present

## 2019-10-20 DIAGNOSIS — S72351D Displaced comminuted fracture of shaft of right femur, subsequent encounter for closed fracture with routine healing: Secondary | ICD-10-CM | POA: Diagnosis not present

## 2019-10-20 DIAGNOSIS — M810 Age-related osteoporosis without current pathological fracture: Secondary | ICD-10-CM | POA: Diagnosis not present

## 2019-10-20 DIAGNOSIS — Z7901 Long term (current) use of anticoagulants: Secondary | ICD-10-CM | POA: Diagnosis not present

## 2019-10-20 DIAGNOSIS — S90521D Blister (nonthermal), right ankle, subsequent encounter: Secondary | ICD-10-CM | POA: Diagnosis not present

## 2019-10-20 DIAGNOSIS — C7931 Secondary malignant neoplasm of brain: Secondary | ICD-10-CM | POA: Diagnosis not present

## 2019-10-20 DIAGNOSIS — F0390 Unspecified dementia without behavioral disturbance: Secondary | ICD-10-CM | POA: Diagnosis not present

## 2019-10-20 DIAGNOSIS — S82141D Displaced bicondylar fracture of right tibia, subsequent encounter for closed fracture with routine healing: Secondary | ICD-10-CM | POA: Diagnosis not present

## 2019-10-20 NOTE — Telephone Encounter (Signed)
I tried to refer him to [facility] for CCM care, but I understand he declined their care. No, I have not heard from nursing agency.

## 2019-10-20 NOTE — Telephone Encounter (Signed)
I called him back he said he didn't deny them whoever he spoke to stated that they wanted to come in at 9:00 in the morning to look at a wound on her foot, he didn't want anyone coming that early to look at a wound. He is wanting a nurse to come in but they didn't explain to him that they were going to do other care besides wound care

## 2019-10-20 NOTE — Telephone Encounter (Signed)
Called pt husband to get dates pt was in rehab Oct/Nov 2020 pt husband stated she was there from Jun 19 2019- Jul 09 2019  Pt husband stated that she currently have someone from an agency coming out to do physical therapy with her and someone also comes out to take care of the wound care on her foot. He stated the agency was suppose to request for a nurse to come out to do in and out catheters but no one has come out yet he was wondering if they contacted you about this?

## 2019-10-20 NOTE — Telephone Encounter (Signed)
The pt's husband Dr. Amadeo Garnet was told that Dr. Baird Cancer wants to know if the pt is hydrated because Marianna Fuss from [facility] and said that the pt's heart rate today is 120.  Dr. Amadeo Garnet said no because the pt had a UTI so she wasn't drinking a lot of fluids but that he will increase her water intake.

## 2019-10-20 NOTE — Chronic Care Management (AMB) (Signed)
Care Management Note   Valerie [name] is a 74 y.o. year old female who is a primary care patient of [name], MD . The CM team was consulted for assistance with care coordination.   Review of patient status, including review of consultants reports, rand collaboration with appropriate care team members and the patient's provider was performed as part of comprehensive patient evaluation and provision of care management services. Telephone outreach to patient today to introduce CM services.   SDOH (Social Determinants of Health) assessments performed: No    Outpatient Encounter Medications as of 10/20/2019  Medication Sig  . Ascorbic Acid (VITAMIN C) 100 MG tablet Take 100 mg by mouth every evening. Gummie  . cholecalciferol (VITAMIN D) 1000 UNITS tablet Take 1,000 Units by mouth every evening.   . donepezil (ARICEPT) 10 MG tablet Take 1 tablet (10 mg total) by mouth every evening.  [name] HYDROcodone-acetaminophen (NORCO/VICODIN) 5-325 MG tablet Take 2 tablets by mouth every 6 (six) hours as needed for moderate pain or severe pain. (Patient not taking: Reported on 10/14/2019)  . Ibuprofen (ADVIL) 200 MG CAPS Take by mouth. Prn  . loperamide (IMODIUM) 2 MG capsule Take 2 mg by mouth as needed for diarrhea or loose stools.   [name] MAGNESIUM PO Take 1 tablet by mouth daily.  . methocarbamol (ROBAXIN) 500 MG tablet Take 1 tablet (500 mg total) by mouth every 6 (six) hours as needed for muscle spasms. (Patient not taking: Reported on 10/14/2019)  . Multiple Vitamin (MULTI-VITAMINS) TABS Take 1 tablet by mouth every evening.   . polyethylene glycol (MIRALAX / GLYCOLAX) 17 g packet Take 17 g by mouth daily.  . potassium chloride SA (KLOR-CON) 20 MEQ tablet Take 1 tablet (20 mEq total) by mouth 2 (two) times daily.  [name] TAGRISSO 80 MG tablet TAKE 1 TABLET ('80MG'$ ) BY MOUTH DAILY. (Patient taking differently: TAKE 1 TABLET ('80MG'$ ) BY MOUTH DAILY.)  . tamsulosin (FLOMAX) 0.4 MG CAPS capsule Take 1 capsule  (0.4 mg total) by mouth daily after supper. (Patient not taking: Reported on 10/14/2019)  . vitamin B-12 (CYANOCOBALAMIN) 1000 MCG tablet Take 1,000 mcg by mouth every evening.  [name] 20 MG TABS tablet TAKE 1 TABLET BY MOUTH ONCE DAILY IN THE EVENING (Patient taking differently: Take 20 mg by mouth daily with supper. )   No facility-administered encounter medications on file as of 10/20/2019.    I reached out to Valerie [name] by phone today and spoke with her spouse Valerie [name] due to the patient participating in a therapy session.  Valerie [name] was given information about Chronic Care Management services today including:  1. CCM service includes personalized support from designated clinical staff supervised by her physician, including individualized plan of care and coordination with other care providers 2. 24/7 contact phone numbers for assistance for urgent and routine care needs. 3. Service will only be billed when office clinical staff spend 20 minutes or more in a month to coordinate care. 4. Only one practitioner may furnish and bill the service in a calendar month. 5. The patient may stop CCM services at any time (effective at the end of the month) by phone call to the office staff. 6. The patient will be responsible for cost sharing (co-pay) of up to 20% of the service fee (after annual deductible is met).  Valerie [name] did not agree to services and wishes to consider information provided before deciding about enrollment in care management services.  It is identified the main priority  is to receive orders for an in and out catheter to perform a U/A. Performed chart review to note CMA has contacted patients provider regarding orders. Advised Valerie [name] SW would contact at the end of the week to assess outcome of orders and discuss enrollment.  Follow Up Plan: SW will follow up with patient by phone over the next week.   [name], BSW, CDP Social Worker, Certified Dementia  Practitioner [place] [facility] [place] [facility] [phone]

## 2019-10-22 ENCOUNTER — Encounter: Payer: Medicare Other | Admitting: Internal Medicine

## 2019-10-22 ENCOUNTER — Ambulatory Visit: Payer: Medicare Other

## 2019-10-22 DIAGNOSIS — C7931 Secondary malignant neoplasm of brain: Secondary | ICD-10-CM | POA: Diagnosis not present

## 2019-10-22 DIAGNOSIS — S90521D Blister (nonthermal), right ankle, subsequent encounter: Secondary | ICD-10-CM | POA: Diagnosis not present

## 2019-10-22 DIAGNOSIS — C3411 Malignant neoplasm of upper lobe, right bronchus or lung: Secondary | ICD-10-CM | POA: Diagnosis not present

## 2019-10-22 DIAGNOSIS — S72351D Displaced comminuted fracture of shaft of right femur, subsequent encounter for closed fracture with routine healing: Secondary | ICD-10-CM | POA: Diagnosis not present

## 2019-10-22 DIAGNOSIS — M810 Age-related osteoporosis without current pathological fracture: Secondary | ICD-10-CM | POA: Diagnosis not present

## 2019-10-22 DIAGNOSIS — R339 Retention of urine, unspecified: Secondary | ICD-10-CM | POA: Diagnosis not present

## 2019-10-22 DIAGNOSIS — F0390 Unspecified dementia without behavioral disturbance: Secondary | ICD-10-CM | POA: Diagnosis not present

## 2019-10-22 DIAGNOSIS — Z7901 Long term (current) use of anticoagulants: Secondary | ICD-10-CM | POA: Diagnosis not present

## 2019-10-22 DIAGNOSIS — S82141D Displaced bicondylar fracture of right tibia, subsequent encounter for closed fracture with routine healing: Secondary | ICD-10-CM | POA: Diagnosis not present

## 2019-10-23 ENCOUNTER — Ambulatory Visit: Payer: Self-pay

## 2019-10-23 DIAGNOSIS — R339 Retention of urine, unspecified: Secondary | ICD-10-CM | POA: Diagnosis not present

## 2019-10-23 DIAGNOSIS — M810 Age-related osteoporosis without current pathological fracture: Secondary | ICD-10-CM | POA: Diagnosis not present

## 2019-10-23 DIAGNOSIS — F0390 Unspecified dementia without behavioral disturbance: Secondary | ICD-10-CM | POA: Diagnosis not present

## 2019-10-23 DIAGNOSIS — S72351D Displaced comminuted fracture of shaft of right femur, subsequent encounter for closed fracture with routine healing: Secondary | ICD-10-CM | POA: Diagnosis not present

## 2019-10-23 DIAGNOSIS — S90521D Blister (nonthermal), right ankle, subsequent encounter: Secondary | ICD-10-CM | POA: Diagnosis not present

## 2019-10-23 DIAGNOSIS — Z7901 Long term (current) use of anticoagulants: Secondary | ICD-10-CM | POA: Diagnosis not present

## 2019-10-23 DIAGNOSIS — S82141D Displaced bicondylar fracture of right tibia, subsequent encounter for closed fracture with routine healing: Secondary | ICD-10-CM | POA: Diagnosis not present

## 2019-10-23 DIAGNOSIS — N390 Urinary tract infection, site not specified: Secondary | ICD-10-CM

## 2019-10-23 DIAGNOSIS — C7931 Secondary malignant neoplasm of brain: Secondary | ICD-10-CM | POA: Diagnosis not present

## 2019-10-23 DIAGNOSIS — C3411 Malignant neoplasm of upper lobe, right bronchus or lung: Secondary | ICD-10-CM | POA: Diagnosis not present

## 2019-10-23 NOTE — Chronic Care Management (AMB) (Signed)
Care Management Note   Valerie [name] is a 74 y.o. year old female who is a primary care patient of [name], MD . The CM team was consulted for assistance with care coordination.   Review of patient status, including review of consultants reports, rand collaboration with appropriate care team members and the patient's provider was performed as part of comprehensive patient evaluation and provision of care management services. Telephone outreach to patient today to introduce CM services.   SDOH (Social Determinants of Health) assessments performed: No    Outpatient Encounter Medications as of 10/23/2019  Medication Sig  . Ascorbic Acid (VITAMIN C) 100 MG tablet Take 100 mg by mouth every evening. Gummie  . cholecalciferol (VITAMIN D) 1000 UNITS tablet Take 1,000 Units by mouth every evening.   . donepezil (ARICEPT) 10 MG tablet Take 1 tablet (10 mg total) by mouth every evening.  [name] HYDROcodone-acetaminophen (NORCO/VICODIN) 5-325 MG tablet Take 2 tablets by mouth every 6 (six) hours as needed for moderate pain or severe pain. (Patient not taking: Reported on 10/14/2019)  . Ibuprofen (ADVIL) 200 MG CAPS Take by mouth. Prn  . loperamide (IMODIUM) 2 MG capsule Take 2 mg by mouth as needed for diarrhea or loose stools.   [name] MAGNESIUM PO Take 1 tablet by mouth daily.  . methocarbamol (ROBAXIN) 500 MG tablet Take 1 tablet (500 mg total) by mouth every 6 (six) hours as needed for muscle spasms. (Patient not taking: Reported on 10/14/2019)  . Multiple Vitamin (MULTI-VITAMINS) TABS Take 1 tablet by mouth every evening.   . polyethylene glycol (MIRALAX / GLYCOLAX) 17 g packet Take 17 g by mouth daily.  . potassium chloride SA (KLOR-CON) 20 MEQ tablet Take 1 tablet (20 mEq total) by mouth 2 (two) times daily.  [name] TAGRISSO 80 MG tablet TAKE 1 TABLET ('80MG'$ ) BY MOUTH DAILY. (Patient taking differently: TAKE 1 TABLET ('80MG'$ ) BY MOUTH DAILY.)  . tamsulosin (FLOMAX) 0.4 MG CAPS capsule Take 1 capsule  (0.4 mg total) by mouth daily after supper. (Patient not taking: Reported on 10/14/2019)  . vitamin B-12 (CYANOCOBALAMIN) 1000 MCG tablet Take 1,000 mcg by mouth every evening.  [name] 20 MG TABS tablet TAKE 1 TABLET BY MOUTH ONCE DAILY IN THE EVENING (Patient taking differently: Take 20 mg by mouth daily with supper. )   No facility-administered encounter medications on file as of 10/23/2019.    I reached out to Valerie [name] and her spouse Valerie [name] by phone today for a second attempt to introduce care management services.   Valerie [name] was given information about Chronic Care Management services today including:  1. CCM service includes personalized support from designated clinical staff supervised by her physician, including individualized plan of care and coordination with other care providers 2. 24/7 contact phone numbers for assistance for urgent and routine care needs. 3. Service will only be billed when office clinical staff spend 20 minutes or more in a month to coordinate care. 4. Only one practitioner may furnish and bill the service in a calendar month. 5. The patient may stop CCM services at any time (effective at the end of the month) by phone call to the office staff. 6. The patient will be responsible for cost sharing (co-pay) of up to 20% of the service fee (after annual deductible is met).   Patient agreed to services and verbal consent obtained.  Valerie [name] stated he is not interested in care management services as he is a retired Engineer, drilling and can  care for the patient on his own. Valerie [name] continues to report concern the patient has a UTI and requests orders for [facility] to perform a U/A. Advised Valerie [name] SW would consult with the patients provider requesting orders for services. Requested Valerie [name] contact the practice Monday to confirm orders are placed.    Follow Up Plan: SW collaborated with Dr. [name] requesting home health SNV orders. No further care  management follow up is planned at this time as the patients spouse has declined services.   [name], BSW, CDP Social Worker, Certified Dementia Practitioner [place] [facility] [place] [facility] [phone]

## 2019-10-27 DIAGNOSIS — M810 Age-related osteoporosis without current pathological fracture: Secondary | ICD-10-CM | POA: Diagnosis not present

## 2019-10-27 DIAGNOSIS — C7931 Secondary malignant neoplasm of brain: Secondary | ICD-10-CM | POA: Diagnosis not present

## 2019-10-27 DIAGNOSIS — C3411 Malignant neoplasm of upper lobe, right bronchus or lung: Secondary | ICD-10-CM | POA: Diagnosis not present

## 2019-10-27 DIAGNOSIS — S82141D Displaced bicondylar fracture of right tibia, subsequent encounter for closed fracture with routine healing: Secondary | ICD-10-CM | POA: Diagnosis not present

## 2019-10-27 DIAGNOSIS — F0390 Unspecified dementia without behavioral disturbance: Secondary | ICD-10-CM | POA: Diagnosis not present

## 2019-10-27 DIAGNOSIS — R339 Retention of urine, unspecified: Secondary | ICD-10-CM | POA: Diagnosis not present

## 2019-10-27 DIAGNOSIS — Z7901 Long term (current) use of anticoagulants: Secondary | ICD-10-CM | POA: Diagnosis not present

## 2019-10-27 DIAGNOSIS — S90521D Blister (nonthermal), right ankle, subsequent encounter: Secondary | ICD-10-CM | POA: Diagnosis not present

## 2019-10-27 DIAGNOSIS — S72351D Displaced comminuted fracture of shaft of right femur, subsequent encounter for closed fracture with routine healing: Secondary | ICD-10-CM | POA: Diagnosis not present

## 2019-10-28 ENCOUNTER — Other Ambulatory Visit: Payer: Self-pay

## 2019-10-28 ENCOUNTER — Other Ambulatory Visit: Payer: Self-pay | Admitting: Internal Medicine

## 2019-10-28 DIAGNOSIS — C3411 Malignant neoplasm of upper lobe, right bronchus or lung: Secondary | ICD-10-CM | POA: Diagnosis not present

## 2019-10-28 DIAGNOSIS — S82141D Displaced bicondylar fracture of right tibia, subsequent encounter for closed fracture with routine healing: Secondary | ICD-10-CM | POA: Diagnosis not present

## 2019-10-28 DIAGNOSIS — F0390 Unspecified dementia without behavioral disturbance: Secondary | ICD-10-CM | POA: Diagnosis not present

## 2019-10-28 DIAGNOSIS — S90521D Blister (nonthermal), right ankle, subsequent encounter: Secondary | ICD-10-CM | POA: Diagnosis not present

## 2019-10-28 DIAGNOSIS — M810 Age-related osteoporosis without current pathological fracture: Secondary | ICD-10-CM | POA: Diagnosis not present

## 2019-10-28 DIAGNOSIS — C7931 Secondary malignant neoplasm of brain: Secondary | ICD-10-CM | POA: Diagnosis not present

## 2019-10-28 DIAGNOSIS — S72351D Displaced comminuted fracture of shaft of right femur, subsequent encounter for closed fracture with routine healing: Secondary | ICD-10-CM | POA: Diagnosis not present

## 2019-10-28 DIAGNOSIS — R339 Retention of urine, unspecified: Secondary | ICD-10-CM | POA: Diagnosis not present

## 2019-10-28 DIAGNOSIS — Z7901 Long term (current) use of anticoagulants: Secondary | ICD-10-CM | POA: Diagnosis not present

## 2019-10-28 MED ORDER — DIPHENOXYLATE-ATROPINE 2.5-0.025 MG PO TABS: 1.0000 | ORAL_TABLET | Freq: Three times a day (TID) | ORAL | 0 refills | Status: DC | PRN

## 2019-10-28 MED FILL — DIPHENOXYLATE-ATROPINE 2.5-: 2.5-0.025 | 10 days supply | Qty: 30 | Fill #0

## 2019-10-28 NOTE — Progress Notes (Signed)
lomoti

## 2019-10-29 DIAGNOSIS — C7931 Secondary malignant neoplasm of brain: Secondary | ICD-10-CM | POA: Diagnosis not present

## 2019-10-29 DIAGNOSIS — S82141D Displaced bicondylar fracture of right tibia, subsequent encounter for closed fracture with routine healing: Secondary | ICD-10-CM | POA: Diagnosis not present

## 2019-10-29 DIAGNOSIS — S90521D Blister (nonthermal), right ankle, subsequent encounter: Secondary | ICD-10-CM | POA: Diagnosis not present

## 2019-10-29 DIAGNOSIS — S72351D Displaced comminuted fracture of shaft of right femur, subsequent encounter for closed fracture with routine healing: Secondary | ICD-10-CM | POA: Diagnosis not present

## 2019-10-29 DIAGNOSIS — M810 Age-related osteoporosis without current pathological fracture: Secondary | ICD-10-CM | POA: Diagnosis not present

## 2019-10-29 DIAGNOSIS — F0390 Unspecified dementia without behavioral disturbance: Secondary | ICD-10-CM | POA: Diagnosis not present

## 2019-10-29 DIAGNOSIS — R339 Retention of urine, unspecified: Secondary | ICD-10-CM | POA: Diagnosis not present

## 2019-10-29 DIAGNOSIS — Z7901 Long term (current) use of anticoagulants: Secondary | ICD-10-CM | POA: Diagnosis not present

## 2019-10-29 DIAGNOSIS — C3411 Malignant neoplasm of upper lobe, right bronchus or lung: Secondary | ICD-10-CM | POA: Diagnosis not present

## 2019-10-30 DIAGNOSIS — F0390 Unspecified dementia without behavioral disturbance: Secondary | ICD-10-CM | POA: Diagnosis not present

## 2019-10-30 DIAGNOSIS — S90521D Blister (nonthermal), right ankle, subsequent encounter: Secondary | ICD-10-CM | POA: Diagnosis not present

## 2019-10-30 DIAGNOSIS — S72351D Displaced comminuted fracture of shaft of right femur, subsequent encounter for closed fracture with routine healing: Secondary | ICD-10-CM | POA: Diagnosis not present

## 2019-10-30 DIAGNOSIS — Z7901 Long term (current) use of anticoagulants: Secondary | ICD-10-CM | POA: Diagnosis not present

## 2019-10-30 DIAGNOSIS — Z79899 Other long term (current) drug therapy: Secondary | ICD-10-CM

## 2019-10-30 DIAGNOSIS — M810 Age-related osteoporosis without current pathological fracture: Secondary | ICD-10-CM | POA: Diagnosis not present

## 2019-10-30 DIAGNOSIS — Z87440 Personal history of urinary (tract) infections: Secondary | ICD-10-CM

## 2019-10-30 DIAGNOSIS — C3411 Malignant neoplasm of upper lobe, right bronchus or lung: Secondary | ICD-10-CM | POA: Diagnosis not present

## 2019-10-30 DIAGNOSIS — C7931 Secondary malignant neoplasm of brain: Secondary | ICD-10-CM | POA: Diagnosis not present

## 2019-10-30 DIAGNOSIS — S82141D Displaced bicondylar fracture of right tibia, subsequent encounter for closed fracture with routine healing: Secondary | ICD-10-CM | POA: Diagnosis not present

## 2019-10-30 DIAGNOSIS — R339 Retention of urine, unspecified: Secondary | ICD-10-CM | POA: Diagnosis not present

## 2019-11-04 DIAGNOSIS — S90521D Blister (nonthermal), right ankle, subsequent encounter: Secondary | ICD-10-CM | POA: Diagnosis not present

## 2019-11-04 DIAGNOSIS — S82141D Displaced bicondylar fracture of right tibia, subsequent encounter for closed fracture with routine healing: Secondary | ICD-10-CM | POA: Diagnosis not present

## 2019-11-04 DIAGNOSIS — C7931 Secondary malignant neoplasm of brain: Secondary | ICD-10-CM | POA: Diagnosis not present

## 2019-11-04 DIAGNOSIS — C3411 Malignant neoplasm of upper lobe, right bronchus or lung: Secondary | ICD-10-CM | POA: Diagnosis not present

## 2019-11-04 DIAGNOSIS — M810 Age-related osteoporosis without current pathological fracture: Secondary | ICD-10-CM | POA: Diagnosis not present

## 2019-11-04 DIAGNOSIS — S72351D Displaced comminuted fracture of shaft of right femur, subsequent encounter for closed fracture with routine healing: Secondary | ICD-10-CM | POA: Diagnosis not present

## 2019-11-04 DIAGNOSIS — N390 Urinary tract infection, site not specified: Secondary | ICD-10-CM | POA: Diagnosis not present

## 2019-11-04 DIAGNOSIS — F0390 Unspecified dementia without behavioral disturbance: Secondary | ICD-10-CM | POA: Diagnosis not present

## 2019-11-04 DIAGNOSIS — R339 Retention of urine, unspecified: Secondary | ICD-10-CM | POA: Diagnosis not present

## 2019-11-04 DIAGNOSIS — Z7901 Long term (current) use of anticoagulants: Secondary | ICD-10-CM | POA: Diagnosis not present

## 2019-11-06 DIAGNOSIS — S82141D Displaced bicondylar fracture of right tibia, subsequent encounter for closed fracture with routine healing: Secondary | ICD-10-CM | POA: Diagnosis not present

## 2019-11-06 DIAGNOSIS — C7931 Secondary malignant neoplasm of brain: Secondary | ICD-10-CM | POA: Diagnosis not present

## 2019-11-06 DIAGNOSIS — S72351D Displaced comminuted fracture of shaft of right femur, subsequent encounter for closed fracture with routine healing: Secondary | ICD-10-CM | POA: Diagnosis not present

## 2019-11-06 DIAGNOSIS — C3411 Malignant neoplasm of upper lobe, right bronchus or lung: Secondary | ICD-10-CM | POA: Diagnosis not present

## 2019-11-06 DIAGNOSIS — R339 Retention of urine, unspecified: Secondary | ICD-10-CM | POA: Diagnosis not present

## 2019-11-06 DIAGNOSIS — Z7901 Long term (current) use of anticoagulants: Secondary | ICD-10-CM | POA: Diagnosis not present

## 2019-11-06 DIAGNOSIS — M810 Age-related osteoporosis without current pathological fracture: Secondary | ICD-10-CM | POA: Diagnosis not present

## 2019-11-06 DIAGNOSIS — S90521D Blister (nonthermal), right ankle, subsequent encounter: Secondary | ICD-10-CM | POA: Diagnosis not present

## 2019-11-06 DIAGNOSIS — F0390 Unspecified dementia without behavioral disturbance: Secondary | ICD-10-CM | POA: Diagnosis not present

## 2019-11-09 ENCOUNTER — Other Ambulatory Visit: Payer: Self-pay | Admitting: Internal Medicine

## 2019-11-09 DIAGNOSIS — C3411 Malignant neoplasm of upper lobe, right bronchus or lung: Secondary | ICD-10-CM

## 2019-11-10 ENCOUNTER — Telehealth: Payer: Self-pay | Admitting: Medical Oncology

## 2019-11-10 ENCOUNTER — Telehealth: Payer: Self-pay

## 2019-11-10 DIAGNOSIS — S82141D Displaced bicondylar fracture of right tibia, subsequent encounter for closed fracture with routine healing: Secondary | ICD-10-CM | POA: Diagnosis not present

## 2019-11-10 DIAGNOSIS — S72351D Displaced comminuted fracture of shaft of right femur, subsequent encounter for closed fracture with routine healing: Secondary | ICD-10-CM | POA: Diagnosis not present

## 2019-11-10 DIAGNOSIS — Z7901 Long term (current) use of anticoagulants: Secondary | ICD-10-CM | POA: Diagnosis not present

## 2019-11-10 DIAGNOSIS — M810 Age-related osteoporosis without current pathological fracture: Secondary | ICD-10-CM | POA: Diagnosis not present

## 2019-11-10 DIAGNOSIS — S90521D Blister (nonthermal), right ankle, subsequent encounter: Secondary | ICD-10-CM | POA: Diagnosis not present

## 2019-11-10 DIAGNOSIS — R339 Retention of urine, unspecified: Secondary | ICD-10-CM | POA: Diagnosis not present

## 2019-11-10 DIAGNOSIS — C7931 Secondary malignant neoplasm of brain: Secondary | ICD-10-CM | POA: Diagnosis not present

## 2019-11-10 DIAGNOSIS — C3411 Malignant neoplasm of upper lobe, right bronchus or lung: Secondary | ICD-10-CM | POA: Diagnosis not present

## 2019-11-10 DIAGNOSIS — F0390 Unspecified dementia without behavioral disturbance: Secondary | ICD-10-CM | POA: Diagnosis not present

## 2019-11-10 MED ORDER — NITROFURANTOIN MONOHYD MACRO 100 MG PO CAPS: 100.0000 mg | ORAL_CAPSULE | Freq: Two times a day (BID) | ORAL | 0 refills | Status: DC

## 2019-11-10 NOTE — Telephone Encounter (Signed)
Appt confirmed

## 2019-11-10 NOTE — Telephone Encounter (Signed)
Mr. Wentz said to sent the pt's rx for her UTI to [facility] on [address].   The prescription was faxed.

## 2019-11-12 DIAGNOSIS — F0390 Unspecified dementia without behavioral disturbance: Secondary | ICD-10-CM | POA: Diagnosis not present

## 2019-11-12 DIAGNOSIS — S72351D Displaced comminuted fracture of shaft of right femur, subsequent encounter for closed fracture with routine healing: Secondary | ICD-10-CM | POA: Diagnosis not present

## 2019-11-12 DIAGNOSIS — C3411 Malignant neoplasm of upper lobe, right bronchus or lung: Secondary | ICD-10-CM | POA: Diagnosis not present

## 2019-11-12 DIAGNOSIS — S90521D Blister (nonthermal), right ankle, subsequent encounter: Secondary | ICD-10-CM | POA: Diagnosis not present

## 2019-11-12 DIAGNOSIS — Z7901 Long term (current) use of anticoagulants: Secondary | ICD-10-CM | POA: Diagnosis not present

## 2019-11-12 DIAGNOSIS — C7931 Secondary malignant neoplasm of brain: Secondary | ICD-10-CM | POA: Diagnosis not present

## 2019-11-12 DIAGNOSIS — S82141D Displaced bicondylar fracture of right tibia, subsequent encounter for closed fracture with routine healing: Secondary | ICD-10-CM | POA: Diagnosis not present

## 2019-11-12 DIAGNOSIS — R339 Retention of urine, unspecified: Secondary | ICD-10-CM | POA: Diagnosis not present

## 2019-11-12 DIAGNOSIS — M810 Age-related osteoporosis without current pathological fracture: Secondary | ICD-10-CM | POA: Diagnosis not present

## 2019-11-15 DIAGNOSIS — S72401A Unspecified fracture of lower end of right femur, initial encounter for closed fracture: Secondary | ICD-10-CM | POA: Diagnosis not present

## 2019-11-16 ENCOUNTER — Telehealth: Payer: Self-pay

## 2019-11-16 DIAGNOSIS — S72351D Displaced comminuted fracture of shaft of right femur, subsequent encounter for closed fracture with routine healing: Secondary | ICD-10-CM | POA: Diagnosis not present

## 2019-11-16 DIAGNOSIS — S82141D Displaced bicondylar fracture of right tibia, subsequent encounter for closed fracture with routine healing: Secondary | ICD-10-CM | POA: Diagnosis not present

## 2019-11-16 DIAGNOSIS — R339 Retention of urine, unspecified: Secondary | ICD-10-CM | POA: Diagnosis not present

## 2019-11-16 DIAGNOSIS — Z7901 Long term (current) use of anticoagulants: Secondary | ICD-10-CM | POA: Diagnosis not present

## 2019-11-16 DIAGNOSIS — C7931 Secondary malignant neoplasm of brain: Secondary | ICD-10-CM | POA: Diagnosis not present

## 2019-11-16 DIAGNOSIS — C3411 Malignant neoplasm of upper lobe, right bronchus or lung: Secondary | ICD-10-CM | POA: Diagnosis not present

## 2019-11-16 DIAGNOSIS — M810 Age-related osteoporosis without current pathological fracture: Secondary | ICD-10-CM | POA: Diagnosis not present

## 2019-11-16 DIAGNOSIS — S90521D Blister (nonthermal), right ankle, subsequent encounter: Secondary | ICD-10-CM | POA: Diagnosis not present

## 2019-11-16 DIAGNOSIS — F0390 Unspecified dementia without behavioral disturbance: Secondary | ICD-10-CM | POA: Diagnosis not present

## 2019-11-16 NOTE — Telephone Encounter (Signed)
The patient and her husband Dr. Amadeo Garnet was notified that the pt's paperwork is ready for pickup.

## 2019-11-17 ENCOUNTER — Other Ambulatory Visit: Payer: Self-pay

## 2019-11-17 ENCOUNTER — Inpatient Hospital Stay: Payer: Medicare PPO

## 2019-11-17 ENCOUNTER — Inpatient Hospital Stay: Payer: Medicare PPO | Attending: Internal Medicine | Admitting: Internal Medicine

## 2019-11-17 ENCOUNTER — Telehealth: Payer: Self-pay | Admitting: Internal Medicine

## 2019-11-17 ENCOUNTER — Encounter: Payer: Self-pay | Admitting: Internal Medicine

## 2019-11-17 VITALS — BP 117/67 | HR 95 | Temp 98.3°F | Resp 18

## 2019-11-17 DIAGNOSIS — C7931 Secondary malignant neoplasm of brain: Secondary | ICD-10-CM | POA: Diagnosis not present

## 2019-11-17 DIAGNOSIS — M818 Other osteoporosis without current pathological fracture: Secondary | ICD-10-CM | POA: Insufficient documentation

## 2019-11-17 DIAGNOSIS — C7801 Secondary malignant neoplasm of right lung: Secondary | ICD-10-CM | POA: Diagnosis not present

## 2019-11-17 DIAGNOSIS — Z5111 Encounter for antineoplastic chemotherapy: Secondary | ICD-10-CM

## 2019-11-17 DIAGNOSIS — R531 Weakness: Secondary | ICD-10-CM | POA: Diagnosis not present

## 2019-11-17 DIAGNOSIS — C3490 Malignant neoplasm of unspecified part of unspecified bronchus or lung: Secondary | ICD-10-CM

## 2019-11-17 DIAGNOSIS — Z9221 Personal history of antineoplastic chemotherapy: Secondary | ICD-10-CM | POA: Insufficient documentation

## 2019-11-17 DIAGNOSIS — C3432 Malignant neoplasm of lower lobe, left bronchus or lung: Secondary | ICD-10-CM | POA: Insufficient documentation

## 2019-11-17 DIAGNOSIS — D62 Acute posthemorrhagic anemia: Secondary | ICD-10-CM | POA: Diagnosis not present

## 2019-11-17 DIAGNOSIS — I10 Essential (primary) hypertension: Secondary | ICD-10-CM | POA: Insufficient documentation

## 2019-11-17 DIAGNOSIS — C3411 Malignant neoplasm of upper lobe, right bronchus or lung: Secondary | ICD-10-CM

## 2019-11-17 DIAGNOSIS — Z79899 Other long term (current) drug therapy: Secondary | ICD-10-CM | POA: Insufficient documentation

## 2019-11-17 DIAGNOSIS — Z7901 Long term (current) use of anticoagulants: Secondary | ICD-10-CM | POA: Insufficient documentation

## 2019-11-17 DIAGNOSIS — Z923 Personal history of irradiation: Secondary | ICD-10-CM | POA: Diagnosis not present

## 2019-11-17 DIAGNOSIS — Z85850 Personal history of malignant neoplasm of thyroid: Secondary | ICD-10-CM | POA: Diagnosis not present

## 2019-11-17 DIAGNOSIS — D638 Anemia in other chronic diseases classified elsewhere: Secondary | ICD-10-CM | POA: Insufficient documentation

## 2019-11-17 LAB — CBC WITH DIFFERENTIAL (CANCER CENTER ONLY)
Abs Immature Granulocytes: 0.03 10*3/uL (ref 0.00–0.07)
Basophils Absolute: 0 10*3/uL (ref 0.0–0.1)
Basophils Relative: 0 %
Eosinophils Absolute: 0.2 10*3/uL (ref 0.0–0.5)
Eosinophils Relative: 2 %
HCT: 30.1 % — ABNORMAL LOW (ref 36.0–46.0)
Hemoglobin: 9.2 g/dL — ABNORMAL LOW (ref 12.0–15.0)
Immature Granulocytes: 0 %
Lymphocytes Relative: 9 %
Lymphs Abs: 0.8 10*3/uL (ref 0.7–4.0)
MCH: 29 pg (ref 26.0–34.0)
MCHC: 30.6 g/dL (ref 30.0–36.0)
MCV: 95 fL (ref 80.0–100.0)
Monocytes Absolute: 0.5 10*3/uL (ref 0.1–1.0)
Monocytes Relative: 6 %
Neutro Abs: 7.3 10*3/uL (ref 1.7–7.7)
Neutrophils Relative %: 83 %
Platelet Count: 267 10*3/uL (ref 150–400)
RBC: 3.17 MIL/uL — ABNORMAL LOW (ref 3.87–5.11)
RDW: 15.7 % — ABNORMAL HIGH (ref 11.5–15.5)
WBC Count: 8.9 10*3/uL (ref 4.0–10.5)
nRBC: 0 % (ref 0.0–0.2)

## 2019-11-17 LAB — CMP (CANCER CENTER ONLY)
ALT: <6 U/L (ref 0–44)
AST: 8 U/L — ABNORMAL LOW (ref 15–41)
Albumin: 3.4 g/dL — ABNORMAL LOW (ref 3.5–5.0)
Alkaline Phosphatase: 64 U/L (ref 38–126)
Anion gap: 8 (ref 5–15)
BUN: 16 mg/dL (ref 8–23)
CO2: 25 mmol/L (ref 22–32)
Calcium: 9.4 mg/dL (ref 8.9–10.3)
Chloride: 107 mmol/L (ref 98–111)
Creatinine: 1.11 mg/dL — ABNORMAL HIGH (ref 0.44–1.00)
GFR, Est AFR Am: 57 mL/min — ABNORMAL LOW (ref >60)
GFR, Estimated: 49 mL/min — ABNORMAL LOW (ref >60)
Glucose, Bld: 114 mg/dL — ABNORMAL HIGH (ref 70–99)
Potassium: 3.5 mmol/L (ref 3.5–5.1)
Sodium: 140 mmol/L (ref 135–145)
Total Bilirubin: 0.5 mg/dL (ref 0.3–1.2)
Total Protein: 6.3 g/dL — ABNORMAL LOW (ref 6.5–8.1)

## 2019-11-17 LAB — TSH: TSH: 2.439 u[IU]/mL (ref 0.308–3.960)

## 2019-11-17 NOTE — Telephone Encounter (Signed)
Scheduled per los. Gave avs and calendar  

## 2019-11-17 NOTE — Progress Notes (Signed)
[place] Telephone:([phone]) [phone]   [phone]) [phone]  OFFICE PROGRESS NOTE  [name], [address] [place] [address]  PRINCIPAL DIAGNOSIS:  metastatic non-small cell lung cancer, adenocarcinoma with brain metastases initially diagnosed as a stage IIb in October 2002.   BIOMARKERS TESTING ([facility]): Positive for: EGFR [id number] del, T790M, CTNNB1 D32G, SMAD4 T142f*10 Negative for: RET, ALK, BRAF, KRAS, ERBB2 and MET  PRIOR THERAPY:  1. Status post left lower lobectomy under the care of Dr BArlyce Dicein October 2002. 2. Status post course of concurrent chemoradiation with weekly carboplatin and paclitaxel under the care of Dr SBenay Spiceand Dr WElba Barmanin early 2003. 3. Status post treatment [facility] 4. Status post left occipital craniotomy for tumor resection on September 21, 2005, under the care of Dr CChristella Noa 5. Status post whole brain irradiation under the care of Dr WElba Barman completed November 07, 2005. 6. Status post gamma knife [facility] the [facility]cted [facility] 7. Status post right thyroidectomy with radical neck dissection under the care of Dr. RConstance Holsteron 10/18/2011. 8. Status post excisional biopsy of right cervical lymph node that was consistent with metastatic adenocarcinoma. 9. Tarceva 150 mg by mouth daily with therapy beginning 06/03/2007, discontinued in July 2015 secondary to disease progression. Status post 79 cycles. 10. palliative radiotherapy to the enlarging right upper lobe lung nodule under the care of Dr. MValere Dross [facility]. [id number] 300 mg by mouth daily for 21 days discontinued secondary to study deviation and [facility]. 12. Open reduction and  internal fixation of trimalleolar ankle fracture dislocation with fixation of the fibula as well as medial malleolus.   CURRENT THERAPY: Tagrisso 80 mg by mouth daily started 11/25/2014, status post 60 months of treatment.  INTERVAL HISTORY: Valerie [age]ADDIS89y.o. female returns to the clinic today for follow-up visit accompanied by her husband.  The patient was supposed to come back for follow-up visit in October 2020 but she had several medical issues happening at that time including fracture of the right femur and lateral tibial plateau after a fall in the bathroom.  She underwent open reduction with internal fixation of the distal femur fracture as well as internal fixation of the tibial plateau.  She underwent rehabilitation but in January 2021 she was admitted to the hospital again with pyelonephritis.  The patient continues to have physical therapy but she is so weak and unable to walk.  She missed several of her appointment as well as the repeat imaging studies.  She is currently feeling little bit better with no significant chest pain, shortness of breath, cough or hemoptysis.  She denied having any recent weight loss or night sweats.  She has no fever or chills.  She has no nausea, vomiting but continues to have few episodes of diarrhea with no constipation or abdominal pain.  The patient is here today for reevaluation and recommendation regarding her condition.  She continued her treatment with Tagrisso with no concerning adverse effect except for the episodes of diarrhea.  MEDICAL HISTORY: Past Medical History:  Diagnosis Date  . Abnormal Pap smear 2006  . Anal fistula   . Ankle fracture   . Anxiety   .  Arthritis   . Atrophic vaginitis 2008  . Cataract   . Dementia ([place]) 2009  . Dyspareunia 2008  . H/O osteoporosis   . H/O varicella   . H/O vitamin D deficiency   . Headache 07/24/2016  . Heart murmur   . History of measles, mumps, or rubella   . History of radiation  therapy 07/28/13- 08/10/13   right lung metastasis 5000 cGy 10 sessions  . Hypertension   . Lung cancer ([place]) dx'd 2002  . Lung cancer ([place])   . Lung cancer ([place])   . Lung metastasis ([place])    PET scan 05/05/13, RUL lung nodule  . Metastasis to brain [facility]) dx'd 2008  . 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 Nodule of right lung CT- 06/03/12   RIGHT UPPER LOBE  . Nodule of right lung 06/03/12   Upper Lobe  . On antineoplastic chemotherapy    TARCEVA  . Osteoporosis 2010  . Primary cancer of right upper lobe of lung ([place]) 04/22/2009   Qualifier: Diagnosis of  By: Nils Pyle CMA ([place]), Mearl Latin    . Shortness of breath    hx lung ca   . Status post chemotherapy 2003   CARBOPLATIN/PACLITAXEL /STATUS POST CLINICAL TRAIL OF CELEBREX AND IRESSA AT [facility] FOR 1 YEAR  . Status post radiation therapy 2003   LEFT LUNG  . Status post radiation therapy 11/07/2005   WHOLE BRAIN: DR Larkin In[facility]  . Status post radiation therapy 06/02/2008   GAMMA KNIFE OF RESECTED CAVITAY  . Thyroid adenoma    ?  [name] Thyroid cancer ([place]) 10/18/11 bx   adenoid nodules   . Yeast infection     ALLERGIES:  has No Known Allergies.  MEDICATIONS:  Current Outpatient Medications  Medication Sig Dispense Refill  . Ascorbic Acid (VITAMIN C) 100 MG tablet Take 100 mg by mouth every evening. Gummie    . cholecalciferol (VITAMIN D) 1000 UNITS tablet Take 1,000 Units by mouth every evening.     . diphenoxylate-atropine (LOMOTIL) 2.5-0.025 MG tablet Take 1 tablet by mouth 3 (three) times daily as needed for diarrhea or loose stools. 30 tablet 0  . donepezil (ARICEPT) 10 MG tablet Take 1 tablet (10 mg total) by mouth every evening. 90 tablet 2  . HYDROcodone-acetaminophen (NORCO/VICODIN) 5-325 MG tablet Take 2 tablets by mouth every 6 (six) hours as needed for moderate pain or severe pain. (Patient not taking: Reported on 10/14/2019) 30 tablet 0  . Ibuprofen (ADVIL) 200 MG CAPS Take by mouth. Prn    . loperamide (IMODIUM) 2  MG capsule Take 2 mg by mouth as needed for diarrhea or loose stools.     [name] MAGNESIUM PO Take 1 tablet by mouth daily.    . methocarbamol (ROBAXIN) 500 MG tablet Take 1 tablet (500 mg total) by mouth every 6 (six) hours as needed for muscle spasms. (Patient not taking: Reported on 10/14/2019)    . Multiple Vitamin (MULTI-VITAMINS) TABS Take 1 tablet by mouth every evening.     . nitrofurantoin, macrocrystal-monohydrate, (MACROBID) 100 MG capsule Take 1 capsule (100 mg total) by mouth 2 (two) times daily. 14 capsule 0  . polyethylene glycol (MIRALAX / GLYCOLAX) 17 g packet Take 17 g by mouth daily. 14 each 0  . potassium chloride SA (KLOR-CON) 20 MEQ tablet Take 1 tablet (20 mEq total) by mouth 2 (two) times daily. 60 tablet 0  . TAGRISSO 80 MG tablet TAKE 1 TABLET (80MG) BY MOUTH DAILY. 30 tablet  2  . tamsulosin (FLOMAX) 0.4 MG CAPS capsule Take 1 capsule (0.4 mg total) by mouth daily after supper. (Patient not taking: Reported on 10/14/2019) 30 capsule 0  . vitamin B-12 (CYANOCOBALAMIN) 1000 MCG tablet Take 1,000 mcg by mouth every evening.    [name] 20 MG TABS tablet TAKE 1 TABLET BY MOUTH ONCE DAILY IN THE EVENING (Patient taking differently: Take 20 mg by mouth daily with supper. ) 90 tablet 0   No current facility-administered medications for this visit.    SURGICAL HISTORY:  Past Surgical History:  Procedure Laterality Date  . BRAIN SURGERY    . LEFT LOWER LOBECTOMY  05/2001  . LEFT OCCIPITAL CRANIOTOMY  09/21/2005   tumor  . LUNG LOBECTOMY    . NECK SURGERY    . ORIF ANKLE FRACTURE Left 10/02/2014   Procedure: OPEN REDUCTION INTERNAL FIXATION (ORIF) ANKLE FRACTURE;  Surgeon: Alta Corning, MD;  Location: [facility];  Service: Orthopedics;  Laterality: Left;  . ORIF FEMUR FRACTURE Right 06/11/2019   Procedure: OPEN REDUCTION INTERNAL FIXATION (ORIF) DISTAL FEMUR FRACTURE;  Surgeon: Renette Butters, MD;  Location: [place];  Service: Orthopedics;  Laterality: Right;  . ORIF TIBIA PLATEAU  Right 06/11/2019   Procedure: Open Reduction Internal Fixation (Orif) Tibial Plateau;  Surgeon: Renette Butters, MD;  Location: [place];  Service: Orthopedics;  Laterality: Right;  . RADICAL NECK DISSECTION  10/18/2011   Procedure: RADICAL NECK DISSECTION;  Surgeon: Izora Gala, MD;  Location: [place];  Service: ENT;  Laterality: Right;  RIGHT MODIFIED NECK DISSECTION /POSSIBLE RIGHT THYROIDECTOM  . THYROIDECTOMY  10/18/2011   Procedure: THYROIDECTOMY WITH RADICAL NECK DISSECTION;  Surgeon: Izora Gala, MD;  Location: [place];  Service: ENT;  Laterality: Right;    REVIEW OF SYSTEMS:  Constitutional: positive for fatigue Eyes: negative Ears, nose, mouth, throat, and face: negative Respiratory: negative Cardiovascular: negative Gastrointestinal: positive for diarrhea Genitourinary:negative Integument/breast: negative Hematologic/lymphatic: negative Musculoskeletal:positive for muscle weakness Neurological: negative Behavioral/Psych: negative Endocrine: negative Allergic/Immunologic: negative   PHYSICAL EXAMINATION: General appearance: alert, cooperative, fatigued and no distress Head: Normocephalic, without obvious abnormality, atraumatic Neck: no adenopathy, supple, symmetrical, trachea midline and thyroid not enlarged, symmetric, no tenderness/mass/nodules Lymph nodes: Cervical, supraclavicular, and axillary nodes normal. Resp: clear to auscultation bilaterally Back: symmetric, no curvature. ROM normal. No CVA tenderness. Cardio: regular rate and rhythm, S1, S2 normal, no murmur, click, rub or gallop GI: soft, non-tender; bowel sounds normal; no masses,  no organomegaly Extremities: extremities normal, atraumatic, no cyanosis or edema Neurologic: Alert and oriented X 3, normal strength and tone. Normal symmetric reflexes. Normal coordination and gait  ECOG PERFORMANCE STATUS: 1 - Symptomatic but completely ambulatory  Blood pressure 117/67, pulse 95, temperature 98.3 F (36.8 C),  temperature source Oral, resp. rate 18, SpO2 100 %.  LABORATORY DATA: Lab Results  Component Value Date   WBC 8.9 11/17/2019   HGB 9.2 (L) 11/17/2019   HCT 30.1 (L) 11/17/2019   MCV 95.0 11/17/2019   PLT 267 11/17/2019      Chemistry      Component Value Date/Time   NA 138 09/18/2019 0339   NA 148 (H) 10/16/2018 1300   NA 141 08/06/2017 1047   K 3.5 09/18/2019 0339   K 3.9 08/06/2017 1047   CL 103 09/18/2019 0339   CL 106 12/09/2012 0806   CO2 29 09/18/2019 0339   CO2 24 08/06/2017 1047   BUN 14 09/18/2019 0339   BUN 16 10/16/2018 1300   BUN  15.4 08/06/2017 1047   CREATININE 0.72 09/18/2019 0339   CREATININE 1.07 (H) 05/12/2019 1051   CREATININE 1.0 08/06/2017 1047      Component Value Date/Time   CALCIUM 8.7 (L) 09/18/2019 0339   CALCIUM 9.8 08/06/2017 1047   ALKPHOS 46 09/18/2019 0339   ALKPHOS 65 08/06/2017 1047   AST 15 09/18/2019 0339   AST 10 (L) 05/12/2019 1051   AST 11 08/06/2017 1047   ALT 11 09/18/2019 0339   ALT 7 05/12/2019 1051   ALT 9 08/06/2017 1047   BILITOT 0.6 09/18/2019 0339   BILITOT 0.7 05/12/2019 1051   BILITOT 0.66 08/06/2017 1047       RADIOGRAPHIC STUDIES: No results found.  ASSESSMENT AND PLAN: This is a very pleasant 74 years old African-American female with metastatic non-small cell lung cancer, adenocarcinoma with positive EGFR mutation status post several years of treatment with Tarceva and the patient developed positive resistant mutation, T790M. She has been on treatment with Tagrisso for the last 5 years, status post 60 months of treatment.  She has been tolerating her treatment well with no concerning adverse effect except for the few episodes of diarrhea. The patient did not have imaging studies for more than 12 months. I recommended for her to continue her current treatment with Tagrisso with the same dose. I will arrange for the patient to have repeat CT scan of the neck, chest, abdomen and pelvis in 1 month and she will  come back for follow-up visit at that time. For the weakness of the lower extremities, she will continue her physical therapy. For the anemia of chronic disease, she will start over-the-counter iron supplements for now as well as multivitamins. The patient was advised to call immediately if she has any concerning symptoms in the interval. The patient voices understanding of current disease status and treatment options and is in agreement with the current care plan. All questions were answered. The patient knows to call the clinic with any problems, questions or concerns. We can certainly see the patient much sooner if necessary. I spent 20 minutes counseling the patient face to face. The total time spent in the appointment was 25 minutes. Disclaimer: This note was dictated with voice recognition software. Similar sounding words can inadvertently be transcribed and may not be corrected upon review.

## 2019-11-18 DIAGNOSIS — S82141D Displaced bicondylar fracture of right tibia, subsequent encounter for closed fracture with routine healing: Secondary | ICD-10-CM | POA: Diagnosis not present

## 2019-11-18 DIAGNOSIS — S90521D Blister (nonthermal), right ankle, subsequent encounter: Secondary | ICD-10-CM | POA: Diagnosis not present

## 2019-11-18 DIAGNOSIS — R339 Retention of urine, unspecified: Secondary | ICD-10-CM | POA: Diagnosis not present

## 2019-11-18 DIAGNOSIS — F0390 Unspecified dementia without behavioral disturbance: Secondary | ICD-10-CM | POA: Diagnosis not present

## 2019-11-18 DIAGNOSIS — Z7901 Long term (current) use of anticoagulants: Secondary | ICD-10-CM | POA: Diagnosis not present

## 2019-11-18 DIAGNOSIS — C7931 Secondary malignant neoplasm of brain: Secondary | ICD-10-CM | POA: Diagnosis not present

## 2019-11-18 DIAGNOSIS — S72351D Displaced comminuted fracture of shaft of right femur, subsequent encounter for closed fracture with routine healing: Secondary | ICD-10-CM | POA: Diagnosis not present

## 2019-11-18 DIAGNOSIS — C3411 Malignant neoplasm of upper lobe, right bronchus or lung: Secondary | ICD-10-CM | POA: Diagnosis not present

## 2019-11-18 DIAGNOSIS — M810 Age-related osteoporosis without current pathological fracture: Secondary | ICD-10-CM | POA: Diagnosis not present

## 2019-11-19 ENCOUNTER — Encounter: Payer: Self-pay | Admitting: Internal Medicine

## 2019-11-19 ENCOUNTER — Other Ambulatory Visit: Payer: Self-pay | Admitting: Internal Medicine

## 2019-11-19 DIAGNOSIS — S90521D Blister (nonthermal), right ankle, subsequent encounter: Secondary | ICD-10-CM | POA: Diagnosis not present

## 2019-11-19 DIAGNOSIS — S72351D Displaced comminuted fracture of shaft of right femur, subsequent encounter for closed fracture with routine healing: Secondary | ICD-10-CM | POA: Diagnosis not present

## 2019-11-19 DIAGNOSIS — S82141D Displaced bicondylar fracture of right tibia, subsequent encounter for closed fracture with routine healing: Secondary | ICD-10-CM | POA: Diagnosis not present

## 2019-11-19 DIAGNOSIS — C3411 Malignant neoplasm of upper lobe, right bronchus or lung: Secondary | ICD-10-CM | POA: Diagnosis not present

## 2019-11-19 DIAGNOSIS — F0390 Unspecified dementia without behavioral disturbance: Secondary | ICD-10-CM | POA: Diagnosis not present

## 2019-11-19 DIAGNOSIS — Z7901 Long term (current) use of anticoagulants: Secondary | ICD-10-CM | POA: Diagnosis not present

## 2019-11-19 DIAGNOSIS — M810 Age-related osteoporosis without current pathological fracture: Secondary | ICD-10-CM | POA: Diagnosis not present

## 2019-11-19 DIAGNOSIS — C7931 Secondary malignant neoplasm of brain: Secondary | ICD-10-CM | POA: Diagnosis not present

## 2019-11-19 DIAGNOSIS — R339 Retention of urine, unspecified: Secondary | ICD-10-CM | POA: Diagnosis not present

## 2019-11-19 MED FILL — TAGRISSO 80 MG TABLET: 80 | 30 days supply | Qty: 30 | Fill #0

## 2019-11-20 DIAGNOSIS — S72351D Displaced comminuted fracture of shaft of right femur, subsequent encounter for closed fracture with routine healing: Secondary | ICD-10-CM | POA: Diagnosis not present

## 2019-11-20 DIAGNOSIS — F0390 Unspecified dementia without behavioral disturbance: Secondary | ICD-10-CM | POA: Diagnosis not present

## 2019-11-20 DIAGNOSIS — Z7901 Long term (current) use of anticoagulants: Secondary | ICD-10-CM | POA: Diagnosis not present

## 2019-11-20 DIAGNOSIS — S90521D Blister (nonthermal), right ankle, subsequent encounter: Secondary | ICD-10-CM | POA: Diagnosis not present

## 2019-11-20 DIAGNOSIS — R339 Retention of urine, unspecified: Secondary | ICD-10-CM | POA: Diagnosis not present

## 2019-11-20 DIAGNOSIS — C3411 Malignant neoplasm of upper lobe, right bronchus or lung: Secondary | ICD-10-CM | POA: Diagnosis not present

## 2019-11-20 DIAGNOSIS — S82141D Displaced bicondylar fracture of right tibia, subsequent encounter for closed fracture with routine healing: Secondary | ICD-10-CM | POA: Diagnosis not present

## 2019-11-20 DIAGNOSIS — C7931 Secondary malignant neoplasm of brain: Secondary | ICD-10-CM | POA: Diagnosis not present

## 2019-11-20 DIAGNOSIS — M810 Age-related osteoporosis without current pathological fracture: Secondary | ICD-10-CM | POA: Diagnosis not present

## 2019-11-23 ENCOUNTER — Emergency Department (HOSPITAL_COMMUNITY)
Admission: EM | Admit: 2019-11-23 | Discharge: 2019-11-24 | Disposition: A | Discharge status: Home / Self Care | Payer: Medicare PPO | Attending: Emergency Medicine | Admitting: Emergency Medicine

## 2019-11-23 ENCOUNTER — Emergency Department (HOSPITAL_COMMUNITY): Payer: Medicare PPO

## 2019-11-23 ENCOUNTER — Other Ambulatory Visit: Payer: Self-pay

## 2019-11-23 ENCOUNTER — Encounter (HOSPITAL_COMMUNITY): Payer: Self-pay

## 2019-11-23 DIAGNOSIS — C3490 Malignant neoplasm of unspecified part of unspecified bronchus or lung: Secondary | ICD-10-CM | POA: Diagnosis not present

## 2019-11-23 DIAGNOSIS — C7931 Secondary malignant neoplasm of brain: Secondary | ICD-10-CM | POA: Insufficient documentation

## 2019-11-23 DIAGNOSIS — N1330 Unspecified hydronephrosis: Secondary | ICD-10-CM | POA: Diagnosis not present

## 2019-11-23 DIAGNOSIS — K6289 Other specified diseases of anus and rectum: Secondary | ICD-10-CM | POA: Diagnosis not present

## 2019-11-23 DIAGNOSIS — Z79899 Other long term (current) drug therapy: Secondary | ICD-10-CM | POA: Insufficient documentation

## 2019-11-23 DIAGNOSIS — I10 Essential (primary) hypertension: Secondary | ICD-10-CM | POA: Insufficient documentation

## 2019-11-23 DIAGNOSIS — K625 Hemorrhage of anus and rectum: Secondary | ICD-10-CM | POA: Diagnosis present

## 2019-11-23 DIAGNOSIS — Z7901 Long term (current) use of anticoagulants: Secondary | ICD-10-CM | POA: Insufficient documentation

## 2019-11-23 LAB — CBC
HCT: 32.2 % — ABNORMAL LOW (ref 36.0–46.0)
Hemoglobin: 9.6 g/dL — ABNORMAL LOW (ref 12.0–15.0)
MCH: 28.8 pg (ref 26.0–34.0)
MCHC: 29.8 g/dL — ABNORMAL LOW (ref 30.0–36.0)
MCV: 96.7 fL (ref 80.0–100.0)
Platelets: 286 10*3/uL (ref 150–400)
RBC: 3.33 MIL/uL — ABNORMAL LOW (ref 3.87–5.11)
RDW: 15.5 % (ref 11.5–15.5)
WBC: 7.8 10*3/uL (ref 4.0–10.5)
nRBC: 0 % (ref 0.0–0.2)

## 2019-11-23 LAB — COMPREHENSIVE METABOLIC PANEL
ALT: 7 U/L (ref 0–44)
AST: 9 U/L — ABNORMAL LOW (ref 15–41)
Albumin: 3.4 g/dL — ABNORMAL LOW (ref 3.5–5.0)
Alkaline Phosphatase: 56 U/L (ref 38–126)
Anion gap: 10 (ref 5–15)
BUN: 17 mg/dL (ref 8–23)
CO2: 23 mmol/L (ref 22–32)
Calcium: 9.4 mg/dL (ref 8.9–10.3)
Chloride: 104 mmol/L (ref 98–111)
Creatinine, Ser: 1.19 mg/dL — ABNORMAL HIGH (ref 0.44–1.00)
GFR calc Af Amer: 52 mL/min — ABNORMAL LOW (ref >60)
GFR calc non Af Amer: 45 mL/min — ABNORMAL LOW (ref >60)
Glucose, Bld: 133 mg/dL — ABNORMAL HIGH (ref 70–99)
Potassium: 3.7 mmol/L (ref 3.5–5.1)
Sodium: 137 mmol/L (ref 135–145)
Total Bilirubin: 0.3 mg/dL (ref 0.3–1.2)
Total Protein: 5.8 g/dL — ABNORMAL LOW (ref 6.5–8.1)

## 2019-11-23 LAB — TYPE AND SCREEN
ABO/RH(D): A NEG
Antibody Screen: NEGATIVE

## 2019-11-23 MED ORDER — IOHEXOL 300 MG/ML  SOLN
100.0000 mL | Freq: Once | INTRAMUSCULAR | Status: AC | PRN
  Administered 2019-11-23: 100 mL via INTRAVENOUS

## 2019-11-23 MED ORDER — FLEET ENEMA 7-19 GM/118ML RE ENEM
1.0000 | ENEMA | Freq: Once | RECTAL | Status: AC
  Administered 2019-11-23: 1 via RECTAL
  Filled 2019-11-23: qty 1

## 2019-11-23 MED ORDER — CIPROFLOXACIN HCL 500 MG PO TABS: 500.0000 mg | ORAL_TABLET | Freq: Two times a day (BID) | ORAL | 0 refills | Status: AC

## 2019-11-23 MED ORDER — METRONIDAZOLE 500 MG PO TABS: 500.0000 mg | ORAL_TABLET | Freq: Two times a day (BID) | ORAL | 0 refills | Status: DC

## 2019-11-23 NOTE — ED Provider Notes (Signed)
[facility] EMERGENCY DEPARTMENT Provider Note   CSN: [id number] Arrival date & time: 11/23/19  1420     History Chief Complaint  Patient presents with  . Rectal Bleeding    Valerie WIGGLESWORTH is a 74 y.o. female with PMH significant for metastatic lung cancer with brain metastases status post radiation and chemotherapy and dementia on Xarelto who presents to the ED with complaints of rectal bleeding and pain as well as dysuria.  Patient is accompanied by her husband who is a retired Engineer, drilling.  He states that she has been having pressure ulcers in perianal region for a few months, however over the course of the past couple of weeks she has been experiencing some rectal pain and pain with defecation which is what really prompted him to come to the ED for evaluation.  He has been treating her superficial ulcers with zinc oxide, with good effect.  She is incontinent and often will sit in her own stool and urine which obviously contributes to her skin breakdown.  He states that she is still taking her daily chemotherapy agent which makes her occasionally nauseated and can cause loose stools, but her stools have not been particularly loose.  They have been taking Imodium as needed.  They deny any fevers or chills, chest pain or difficulty breathing, abdominal pain, melena, or other symptoms.  Colonoscopy obtained in 2013 demonstrated scattered diverticula.  HPI     Past Medical History:  Diagnosis Date  . Abnormal Pap smear 2006  . Anal fistula   . Ankle fracture   . Anxiety   . Arthritis   . Atrophic vaginitis 2008  . Cataract   . Dementia ([place]) 2009  . Dyspareunia 2008  . H/O osteoporosis   . H/O varicella   . H/O vitamin D deficiency   . Headache 07/24/2016  . Heart murmur   . History of measles, mumps, or rubella   . History of radiation therapy 07/28/13- 08/10/13   right lung metastasis 5000 cGy 10 sessions  . Hypertension   . Lung cancer ([place]) dx'd 2002  .  Lung cancer ([place])   . Lung cancer ([place])   . Lung metastasis ([place])    PET scan 05/05/13, RUL lung nodule  . Metastasis to brain [facility]) dx'd 2008  . 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 Nodule of right lung CT- 06/03/12   RIGHT UPPER LOBE  . Nodule of right lung 06/03/12   Upper Lobe  . On antineoplastic chemotherapy    TARCEVA  . Osteoporosis 2010  . Primary cancer of right upper lobe of lung ([place]) 04/22/2009   Qualifier: Diagnosis of  By: Nils Pyle CMA ([place]), Mearl Latin    . Shortness of breath    hx lung ca   . Status post chemotherapy 2003   CARBOPLATIN/PACLITAXEL /STATUS POST CLINICAL TRAIL OF CELEBREX AND IRESSA AT [facility] FOR 1 YEAR  . Status post radiation therapy 2003   LEFT LUNG  . Status post radiation therapy 11/07/2005   WHOLE BRAIN: DR Larkin [facility]  . Status post radiation therapy 06/02/2008   GAMMA KNIFE OF RESECTED CAVITAY  . Thyroid adenoma    ?  [name] Thyroid cancer ([place]) 10/18/11 bx   adenoid nodules   . Yeast infection     Patient Active Problem List   Diagnosis Date Noted  . Pyelonephritis 09/10/2019  . Acute blood loss anemia 06/12/2019  . Closed fracture of distal end of right femur, initial encounter ([place]) 06/11/2019  .  Closed fracture of lateral portion of right tibial plateau 06/11/2019  . History of DVT of lower extremity 06/11/2019  . Blurry vision 01/06/2019  . Headache 07/24/2016  . Cough 08/24/2015  . Chronic diarrhea 08/24/2015  . Sepsis ([place])   . Encounter for antineoplastic chemotherapy 01/31/2015  . Urinary retention 10/20/2014  . Ileus ([place]) 10/20/2014  . Cellulitis of leg, right 10/20/2014  . Fracture of right proximal fibula   . Ankle fracture 10/02/2014  . Ankle fracture 10/02/2014  . Lung cancer ([place]) 10/02/2014  . Closed fibular fracture 10/02/2014  . Dementia ([place]) 10/02/2014  . Lower urinary tract infectious disease 10/02/2014  . Reactive airway disease 10/02/2014  . Fall 10/02/2014  . Closed left ankle fracture 10/01/2014  .  Right fibular fracture 10/01/2014  . Lung metastasis ([place])   . LESION, ANUS 04/26/2009  . DIARRHEA 04/26/2009  . Primary cancer of right upper lobe of lung ([place]) 04/22/2009  . Malignant neoplasm of brain ([place]) 04/22/2009  . VITAMIN D DEFICIENCY 04/22/2009  . ANAL FISTULA 04/22/2009  . OSTEOPOROSIS 04/22/2009    Past Surgical History:  Procedure Laterality Date  . BRAIN SURGERY    . LEFT LOWER LOBECTOMY  05/2001  . LEFT OCCIPITAL CRANIOTOMY  09/21/2005   tumor  . LUNG LOBECTOMY    . NECK SURGERY    . ORIF ANKLE FRACTURE Left 10/02/2014   Procedure: OPEN REDUCTION INTERNAL FIXATION (ORIF) ANKLE FRACTURE;  Surgeon: Alta Corning, MD;  Location: [facility];  Service: Orthopedics;  Laterality: Left;  . ORIF FEMUR FRACTURE Right 06/11/2019   Procedure: OPEN REDUCTION INTERNAL FIXATION (ORIF) DISTAL FEMUR FRACTURE;  Surgeon: Renette Butters, MD;  Location: [facility];  Service: Orthopedics;  Laterality: Right;  . ORIF TIBIA PLATEAU Right 06/11/2019   Procedure: Open Reduction Internal Fixation (Orif) Tibial Plateau;  Surgeon: Renette Butters, MD;  Location: [facility];  Service: Orthopedics;  Laterality: Right;  . RADICAL NECK DISSECTION  10/18/2011   Procedure: RADICAL NECK DISSECTION;  Surgeon: Izora Gala, MD;  Location: [facility];  Service: ENT;  Laterality: Right;  RIGHT MODIFIED NECK DISSECTION /POSSIBLE RIGHT THYROIDECTOM  . THYROIDECTOMY  10/18/2011   Procedure: THYROIDECTOMY WITH RADICAL NECK DISSECTION;  Surgeon: Izora Gala, MD;  Location: [facility];  Service: ENT;  Laterality: Right;     OB History    Gravida  2   Para  0   Term  0   Preterm  0   AB  0   Living  2     SAB  0   TAB  0   Ectopic  0   Multiple      Live Births  2           Family History  Problem Relation Age of Onset  . Gait disorder Mother   . Cancer Mother        Meningioma  . Cancer Father        Pancreatic  . Heart failure Sister   . Cancer Maternal Aunt        menigeoma    Social History    Tobacco Use  . Smoking status: Never Smoker  . Smokeless tobacco: Never Used  . Tobacco comment: smoked few years in college  Substance Use Topics  . Alcohol use: No  . Drug use: No    Home Medications Prior to Admission medications   Medication Sig Start Date End Date Taking? Authorizing Provider  Ascorbic Acid (VITAMIN C) 100 MG tablet Take 100  mg by mouth every evening. Gummie    [provider]  cholecalciferol (VITAMIN D) 1000 UNITS tablet Take 1,000 Units by mouth every evening.     [provider]  ciprofloxacin (CIPRO) 500 MG tablet Take 1 tablet (500 mg total) by mouth every 12 (twelve) hours for 7 days. 11/23/19 11/30/19  Corena Herter, PA-C  diphenoxylate-atropine (LOMOTIL) 2.5-0.025 MG tablet TAKE 1 TABLET BY MOUTH THREE TIMES DAILY AS NEEDED FOR DIARRHEA OR LOOSE STOOLS 11/19/19   [name], MD  donepezil (ARICEPT) 10 MG tablet Take 1 tablet (10 mg total) by mouth every evening. 01/13/19   [name], MD  Ibuprofen (ADVIL) 200 MG CAPS Take by mouth. Prn    [provider]  loperamide (IMODIUM) 2 MG capsule Take 2 mg by mouth as needed for diarrhea or loose stools.     [provider]  MAGNESIUM PO Take 1 tablet by mouth daily.    [provider]  metroNIDAZOLE (FLAGYL) 500 MG tablet Take 1 tablet (500 mg total) by mouth 2 (two) times daily. 11/23/19   Corena Herter, PA-C  Multiple Vitamin (MULTI-VITAMINS) TABS Take 1 tablet by mouth every evening.     [provider]  nitrofurantoin, macrocrystal-monohydrate, (MACROBID) 100 MG capsule Take 1 capsule (100 mg total) by mouth 2 (two) times daily. 11/10/19   [name], MD  polyethylene glycol (MIRALAX / GLYCOLAX) 17 g packet Take 17 g by mouth daily. Patient not taking: Reported on 11/17/2019 09/19/19   Elie Confer, MD  TAGRISSO 80 MG tablet TAKE 1 TABLET (80MG ) BY MOUTH DAILY. 11/09/19   Curt Bears, MD  vitamin B-12 (CYANOCOBALAMIN) 1000 MCG tablet Take  1,000 mcg by mouth every evening.    [provider]  XARELTO 20 MG TABS tablet TAKE 1 TABLET BY MOUTH ONCE DAILY IN THE EVENING Patient taking differently: Take 20 mg by mouth daily with supper.  08/17/19   [name], MD    Allergies    Keflex [cephalexin]  Review of Systems   Review of Systems  All other systems reviewed and are negative.   Physical Exam Updated Vital Signs BP 129/87   Pulse (!) 121   Temp 98.5 F (36.9 C) (Oral)   Resp (!) 35   Ht 5\' 9"  (1.753 m)   Wt 81.6 kg   SpO2 100%   BMI 26.57 kg/m   Physical Exam Vitals and nursing note reviewed. Exam conducted with a chaperone present.  Constitutional:      Appearance: Normal appearance.  HENT:     Head: Normocephalic and atraumatic.  Eyes:     General: No scleral icterus.    Conjunctiva/sclera: Conjunctivae normal.  Cardiovascular:     Rate and Rhythm: Normal rate and regular rhythm.     Pulses: Normal pulses.     Heart sounds: Normal heart sounds.  Pulmonary:     Effort: Pulmonary effort is normal. No respiratory distress.     Breath sounds: Normal breath sounds.  Genitourinary:    Comments: Diffuse superficial perianal ulceration, irritation, and bleeding.  Brown soft stool in gluteal cleft.  No gross hematochezia or melena.  Remnant zinc oxide cream appreciated.  No obvious fissures or hemorrhoids appreciated. Musculoskeletal:     Cervical back: Normal range of motion. No rigidity.  Skin:    General: Skin is dry.     Capillary Refill: Capillary refill takes less than 2 seconds.  Neurological:     Mental Status: She is alert and  oriented to person, place, and time.     GCS: GCS eye subscore is 4. GCS verbal subscore is 5. GCS motor subscore is 6.  Psychiatric:        Mood and Affect: Mood normal.        Behavior: Behavior normal.        Thought Content: Thought content normal.     ED Results / Procedures / Treatments   Labs (all labs ordered are listed, but only abnormal results  are displayed) Labs Reviewed  COMPREHENSIVE METABOLIC PANEL - Abnormal; Notable for the following components:      Result Value   Glucose, Bld 133 (*)    Creatinine, Ser 1.19 (*)    Total Protein 5.8 (*)    Albumin 3.4 (*)    AST 9 (*)    GFR calc non Af Amer 45 (*)    GFR calc Af Amer 52 (*)    All other components within normal limits  CBC - Abnormal; Notable for the following components:   RBC 3.33 (*)    Hemoglobin 9.6 (*)    HCT 32.2 (*)    MCHC 29.8 (*)    All other components within normal limits  URINE CULTURE  URINALYSIS, ROUTINE W REFLEX MICROSCOPIC  POC OCCULT BLOOD, ED  TYPE AND SCREEN    EKG None  Radiology CT ABDOMEN PELVIS W CONTRAST  Result Date: 11/23/2019 CLINICAL DATA:  Rectal pain with defecation. EXAM: CT ABDOMEN AND PELVIS WITH CONTRAST TECHNIQUE: Multidetector CT imaging of the abdomen and pelvis was performed using the standard protocol following bolus administration of intravenous contrast. CONTRAST:  111mL OMNIPAQUE IOHEXOL 300 MG/ML  SOLN COMPARISON:  September 10, 2019 FINDINGS: Lower chest: Postoperative changes are again seen involving the lower left hemithorax with mild atelectasis and/or infiltrate is seen within the posterior aspect of the left lung base. Elevation of the left hemidiaphragm is noted. There is a small left pleural effusion. Hepatobiliary: Stable tiny foci of parenchymal low attenuation are seen within the posterior aspect of right lobe of the liver (axial CT images 31 and 45, CT series number 3). No gallstones, gallbladder wall thickening, or biliary dilatation. Pancreas: Unremarkable. No pancreatic ductal dilatation or surrounding inflammatory changes. Spleen: Normal in size without focal abnormality. Adrenals/Urinary Tract: Adrenal glands are unremarkable. Kidneys are normal in size without focal lesions. 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  This extends to the level of the urinary bladder. 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 Bladder is unremarkable. Stomach/Bowel: Stomach is within normal limits. Appendix appears normal. A large amount of stool is seen within the distal sigmoid colon with subsequent sigmoid colon dilatation (8.0 cm x 7.4 cm). This is mildly increased in size when compared to the prior exam. Mild stable rectal wall thickening is seen. No evidence of small bowel dilatation. Noninflamed diverticula are seen throughout the sigmoid colon. Mild, stable perirectal inflammatory fat stranding is seen on the right, without evidence of associated fluid collection or abscess. Vascular/Lymphatic: No significant vascular findings are present. No enlarged abdominal or pelvic lymph nodes. Reproductive: Uterus and bilateral adnexa are unremarkable. Other: No abdominal wall hernia or abnormality. No abdominopelvic ascites. Musculoskeletal: Multilevel degenerative changes seen throughout the lumbar spine with chronic compression fracture deformities involving the L1 and L4 vertebral bodies. IMPRESSION: 1. Large amount of stool within the distal sigmoid colon with subsequent sigmoid colon dilatation, consistent with  persistent fecal impaction. 2. Stable mild rectal wall thickening, possibly representing proctitis. 3. 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 4. Small left pleural effusion. 5. Mild atelectasis and/or infiltrate left lung base. 6. Stable tiny foci of low attenuation within the posterior aspect of the right lobe of the liver, too small to characterize. 7. Chronic compression fracture deformities involving L1 and L4 vertebral bodies. Electronically Signed   By: Virgina Norfolk M.D.   On: 11/23/2019 21:38    Procedures Procedures (including critical care time)  Medications Ordered in ED Medications  iohexol (OMNIPAQUE) 300 MG/ML  solution 100 mL (100 mLs Intravenous Contrast Given 11/23/19 2113)  sodium phosphate (FLEET) 7-19 GM/118ML enema 1 enema (1 enema Rectal Given 11/23/19 2315)    ED Course  I have reviewed the triage vital signs and the nursing notes.  Pertinent labs & imaging results that were available during my care of the patient were reviewed by me and considered in my medical decision making (see chart for details).    MDM Rules/Calculators/A&P                      Patient is anemic to 9.6, but consistent with her baseline.  CMP demonstrates mildly impaired renal function which is normal in labs obtained two months ago. UA still pending.   CT obtained of abdomen pelvis demonstrates large amount of stool within the distal sigmoid colon consistent with persistent fecal impaction.  There is some stable mild rectal wall thickening concerning for proctitis.  There are also incidental findings of a left-sided hydronephrosis and small left pleural effusion.    Performed a rectal disimpaction at bedside.  We were successful and able to extract a large amount of soft stool.  We did appreciate blood-tinged stool and feel as though he will be appropriate to prescribe ciprofloxacin and Flagyl for possible bacterial infection.  UA will likely demonstrate infection as husband states that she has been battling UTI for several months now.  No flank tenderness to palpation.  No fevers or leukocytosis.     On reexamination, patient is still having some pain with defecation, however she feels much improved after disimpaction.  Patient reports that she has had a cleanout at home before that her husband has given her.  She states that her primary concern was that perhaps her cancer had metastasized to her colon.  I informed her that while I could not visualize anything on CT that might suggest that, she would require colonoscopy to evaluate for metastases to colon.  I will put in a referral to gastroenterology for them to follow-up.   Patient is in no acute distress and her lab work today was unremarkable.  I discussed with Dr. Laverta Baltimore who personally evaluated patient refused though she is appropriate for discharge.  I placed a referral for wound care, as well.  She states that she does have nurses who will care for her at home.  Strict ED return precautions discussed.  All of the evaluation and work-up results were discussed with the patient and any family at bedside. They were provided opportunity to ask any additional questions and have none at this time. They have expressed understanding of verbal discharge instructions as well as return precautions and are agreeable to the plan.    Final Clinical Impression(s) / ED Diagnoses Final diagnoses:  Proctitis    Rx / DC Orders ED Discharge Orders  Ordered    Ambulatory referral to Wound Clinic     11/23/19 2316    ciprofloxacin (CIPRO) 500 MG tablet  Every 12 hours     11/23/19 2316    metroNIDAZOLE (FLAGYL) 500 MG tablet  2 times daily     11/23/19 2316           Corena Herter, PA-C 11/24/19 0016    Margette Fast, MD 11/24/19 1252

## 2019-11-23 NOTE — ED Triage Notes (Signed)
Pt arrives to ED w/ c/o rectal bleeding, rectal pain, painful urination.

## 2019-11-23 NOTE — Discharge Instructions (Addendum)
Please follow-up with with [facility] to schedule appointment for ongoing evaluation and management as well as your concern for metastases to colon.  Please take your antibiotics, as prescribed.  I recommend that you have a cleanout of your bowels with MiraLAX or similar laxative treatment.  You had significant amount of stool in your rectum suggestive of impaction that could have contributed to your proctitis.  Return to the ED or seek immediate medical attention should you experience any new or worsening symptoms.

## 2019-11-24 LAB — URINALYSIS, ROUTINE W REFLEX MICROSCOPIC
Bilirubin Urine: NEGATIVE
Glucose, UA: NEGATIVE mg/dL
Ketones, ur: NEGATIVE mg/dL
Nitrite: POSITIVE — AB
Protein, ur: 100 mg/dL — AB
RBC / HPF: >50 RBC/hpf — ABNORMAL HIGH (ref 0–5)
Specific Gravity, Urine: 1.028 (ref 1.005–1.030)
WBC, UA: >50 WBC/hpf — ABNORMAL HIGH (ref 0–5)
pH: 5 (ref 5.0–8.0)

## 2019-11-24 MED FILL — CIPROFLOXACIN HCL 500 MG TA: 500 | 7 days supply | Qty: 14 | Fill #0

## 2019-11-24 MED FILL — METRONIDAZOLE 500 MG TABS: 500 | 7 days supply | Qty: 14 | Fill #0

## 2019-11-25 DIAGNOSIS — S82141D Displaced bicondylar fracture of right tibia, subsequent encounter for closed fracture with routine healing: Secondary | ICD-10-CM | POA: Diagnosis not present

## 2019-11-25 DIAGNOSIS — C3411 Malignant neoplasm of upper lobe, right bronchus or lung: Secondary | ICD-10-CM | POA: Diagnosis not present

## 2019-11-25 DIAGNOSIS — S72351D Displaced comminuted fracture of shaft of right femur, subsequent encounter for closed fracture with routine healing: Secondary | ICD-10-CM | POA: Diagnosis not present

## 2019-11-25 DIAGNOSIS — F0390 Unspecified dementia without behavioral disturbance: Secondary | ICD-10-CM | POA: Diagnosis not present

## 2019-11-25 DIAGNOSIS — R339 Retention of urine, unspecified: Secondary | ICD-10-CM | POA: Diagnosis not present

## 2019-11-25 DIAGNOSIS — C7931 Secondary malignant neoplasm of brain: Secondary | ICD-10-CM | POA: Diagnosis not present

## 2019-11-25 DIAGNOSIS — M810 Age-related osteoporosis without current pathological fracture: Secondary | ICD-10-CM | POA: Diagnosis not present

## 2019-11-25 DIAGNOSIS — S90521D Blister (nonthermal), right ankle, subsequent encounter: Secondary | ICD-10-CM | POA: Diagnosis not present

## 2019-11-25 DIAGNOSIS — Z7901 Long term (current) use of anticoagulants: Secondary | ICD-10-CM | POA: Diagnosis not present

## 2019-11-26 DIAGNOSIS — C3411 Malignant neoplasm of upper lobe, right bronchus or lung: Secondary | ICD-10-CM | POA: Diagnosis not present

## 2019-11-26 DIAGNOSIS — C7931 Secondary malignant neoplasm of brain: Secondary | ICD-10-CM | POA: Diagnosis not present

## 2019-11-26 DIAGNOSIS — F0390 Unspecified dementia without behavioral disturbance: Secondary | ICD-10-CM | POA: Diagnosis not present

## 2019-11-26 DIAGNOSIS — S90521D Blister (nonthermal), right ankle, subsequent encounter: Secondary | ICD-10-CM | POA: Diagnosis not present

## 2019-11-26 DIAGNOSIS — M810 Age-related osteoporosis without current pathological fracture: Secondary | ICD-10-CM | POA: Diagnosis not present

## 2019-11-26 DIAGNOSIS — R339 Retention of urine, unspecified: Secondary | ICD-10-CM | POA: Diagnosis not present

## 2019-11-26 DIAGNOSIS — Z7901 Long term (current) use of anticoagulants: Secondary | ICD-10-CM | POA: Diagnosis not present

## 2019-11-26 DIAGNOSIS — S82141D Displaced bicondylar fracture of right tibia, subsequent encounter for closed fracture with routine healing: Secondary | ICD-10-CM | POA: Diagnosis not present

## 2019-11-26 DIAGNOSIS — S72351D Displaced comminuted fracture of shaft of right femur, subsequent encounter for closed fracture with routine healing: Secondary | ICD-10-CM | POA: Diagnosis not present

## 2019-11-26 LAB — URINE CULTURE: Culture: >=100000 — AB

## 2019-11-27 ENCOUNTER — Telehealth: Payer: Self-pay | Admitting: *Deleted

## 2019-11-27 DIAGNOSIS — S82141D Displaced bicondylar fracture of right tibia, subsequent encounter for closed fracture with routine healing: Secondary | ICD-10-CM | POA: Diagnosis not present

## 2019-11-27 DIAGNOSIS — Z7901 Long term (current) use of anticoagulants: Secondary | ICD-10-CM | POA: Diagnosis not present

## 2019-11-27 DIAGNOSIS — S72351D Displaced comminuted fracture of shaft of right femur, subsequent encounter for closed fracture with routine healing: Secondary | ICD-10-CM | POA: Diagnosis not present

## 2019-11-27 DIAGNOSIS — C7931 Secondary malignant neoplasm of brain: Secondary | ICD-10-CM | POA: Diagnosis not present

## 2019-11-27 DIAGNOSIS — R339 Retention of urine, unspecified: Secondary | ICD-10-CM | POA: Diagnosis not present

## 2019-11-27 DIAGNOSIS — M810 Age-related osteoporosis without current pathological fracture: Secondary | ICD-10-CM | POA: Diagnosis not present

## 2019-11-27 DIAGNOSIS — F0390 Unspecified dementia without behavioral disturbance: Secondary | ICD-10-CM | POA: Diagnosis not present

## 2019-11-27 DIAGNOSIS — S90521D Blister (nonthermal), right ankle, subsequent encounter: Secondary | ICD-10-CM | POA: Diagnosis not present

## 2019-11-27 DIAGNOSIS — C3411 Malignant neoplasm of upper lobe, right bronchus or lung: Secondary | ICD-10-CM | POA: Diagnosis not present

## 2019-11-27 NOTE — Telephone Encounter (Signed)
Post ED Visit - Positive Culture Follow-up  Culture report reviewed by antimicrobial stewardship pharmacist: [facility] Team []  Elenor Quinones, Pharm.D. []  Heide Guile, Pharm.D., BCPS AQ-ID []  Parks Neptune, Pharm.D., BCPS []  Alycia Rossetti, Pharm.D., BCPS []  [place], [place].D., BCPS, AAHIVP []  Legrand Como, Pharm.D., BCPS, AAHIVP []  Salome Arnt, PharmD, BCPS []  Johnnette Gourd, PharmD, BCPS []  Hughes Better, PharmD, BCPS []  Leeroy Cha, PharmD []  Laqueta Linden, PharmD, BCPS []  Albertina Parr, PharmD Nicoletta Dress, PharmD  [facility] Team []  Leodis Sias, PharmD []  Lindell Spar, PharmD []  Royetta Asal, PharmD []  Graylin Shiver, Rph []  Rema Fendt) Glennon Mac, PharmD []  Arlyn Dunning, PharmD []  Netta Cedars, PharmD []  Dia Sitter, PharmD []  Leone Haven, PharmD []  Gretta Arab, PharmD []  Theodis Shove, PharmD []  Peggyann Juba, PharmD []  Reuel Boom, PharmD   Positive urine culture Treated with Ciprofloxacin HCL, organism sensitive to the same and no further patient follow-up is required at this time.  Harlon Flor [facility] 11/27/2019, 12:18 PM

## 2019-12-01 DIAGNOSIS — Z7901 Long term (current) use of anticoagulants: Secondary | ICD-10-CM | POA: Diagnosis not present

## 2019-12-01 DIAGNOSIS — S82141D Displaced bicondylar fracture of right tibia, subsequent encounter for closed fracture with routine healing: Secondary | ICD-10-CM | POA: Diagnosis not present

## 2019-12-01 DIAGNOSIS — M810 Age-related osteoporosis without current pathological fracture: Secondary | ICD-10-CM | POA: Diagnosis not present

## 2019-12-01 DIAGNOSIS — F0390 Unspecified dementia without behavioral disturbance: Secondary | ICD-10-CM | POA: Diagnosis not present

## 2019-12-01 DIAGNOSIS — C3411 Malignant neoplasm of upper lobe, right bronchus or lung: Secondary | ICD-10-CM | POA: Diagnosis not present

## 2019-12-01 DIAGNOSIS — C7931 Secondary malignant neoplasm of brain: Secondary | ICD-10-CM | POA: Diagnosis not present

## 2019-12-01 DIAGNOSIS — R339 Retention of urine, unspecified: Secondary | ICD-10-CM | POA: Diagnosis not present

## 2019-12-01 DIAGNOSIS — S72351D Displaced comminuted fracture of shaft of right femur, subsequent encounter for closed fracture with routine healing: Secondary | ICD-10-CM | POA: Diagnosis not present

## 2019-12-01 DIAGNOSIS — S90521D Blister (nonthermal), right ankle, subsequent encounter: Secondary | ICD-10-CM | POA: Diagnosis not present

## 2019-12-02 DIAGNOSIS — C3411 Malignant neoplasm of upper lobe, right bronchus or lung: Secondary | ICD-10-CM | POA: Diagnosis not present

## 2019-12-02 DIAGNOSIS — C7931 Secondary malignant neoplasm of brain: Secondary | ICD-10-CM | POA: Diagnosis not present

## 2019-12-02 DIAGNOSIS — M810 Age-related osteoporosis without current pathological fracture: Secondary | ICD-10-CM | POA: Diagnosis not present

## 2019-12-02 DIAGNOSIS — F0390 Unspecified dementia without behavioral disturbance: Secondary | ICD-10-CM | POA: Diagnosis not present

## 2019-12-02 DIAGNOSIS — Z7901 Long term (current) use of anticoagulants: Secondary | ICD-10-CM | POA: Diagnosis not present

## 2019-12-02 DIAGNOSIS — R339 Retention of urine, unspecified: Secondary | ICD-10-CM | POA: Diagnosis not present

## 2019-12-02 DIAGNOSIS — S72351D Displaced comminuted fracture of shaft of right femur, subsequent encounter for closed fracture with routine healing: Secondary | ICD-10-CM | POA: Diagnosis not present

## 2019-12-02 DIAGNOSIS — S82141D Displaced bicondylar fracture of right tibia, subsequent encounter for closed fracture with routine healing: Secondary | ICD-10-CM | POA: Diagnosis not present

## 2019-12-02 DIAGNOSIS — S90521D Blister (nonthermal), right ankle, subsequent encounter: Secondary | ICD-10-CM | POA: Diagnosis not present

## 2019-12-10 DIAGNOSIS — R339 Retention of urine, unspecified: Secondary | ICD-10-CM | POA: Diagnosis not present

## 2019-12-10 DIAGNOSIS — Z7901 Long term (current) use of anticoagulants: Secondary | ICD-10-CM | POA: Diagnosis not present

## 2019-12-10 DIAGNOSIS — S72351D Displaced comminuted fracture of shaft of right femur, subsequent encounter for closed fracture with routine healing: Secondary | ICD-10-CM | POA: Diagnosis not present

## 2019-12-10 DIAGNOSIS — M810 Age-related osteoporosis without current pathological fracture: Secondary | ICD-10-CM | POA: Diagnosis not present

## 2019-12-10 DIAGNOSIS — F0390 Unspecified dementia without behavioral disturbance: Secondary | ICD-10-CM | POA: Diagnosis not present

## 2019-12-10 DIAGNOSIS — C7931 Secondary malignant neoplasm of brain: Secondary | ICD-10-CM | POA: Diagnosis not present

## 2019-12-10 DIAGNOSIS — C3411 Malignant neoplasm of upper lobe, right bronchus or lung: Secondary | ICD-10-CM | POA: Diagnosis not present

## 2019-12-10 DIAGNOSIS — S82141D Displaced bicondylar fracture of right tibia, subsequent encounter for closed fracture with routine healing: Secondary | ICD-10-CM | POA: Diagnosis not present

## 2019-12-10 DIAGNOSIS — S90521D Blister (nonthermal), right ankle, subsequent encounter: Secondary | ICD-10-CM | POA: Diagnosis not present

## 2019-12-15 ENCOUNTER — Other Ambulatory Visit: Payer: Self-pay

## 2019-12-15 DIAGNOSIS — Z86718 Personal history of other venous thrombosis and embolism: Secondary | ICD-10-CM

## 2019-12-15 DIAGNOSIS — F0390 Unspecified dementia without behavioral disturbance: Secondary | ICD-10-CM

## 2019-12-15 MED ORDER — XARELTO 20 MG PO TABS: 20.0000 mg | ORAL_TABLET | Freq: Every evening | ORAL | 1 refills | Status: DC

## 2019-12-15 MED ORDER — DONEPEZIL HCL 10 MG PO TABS: 10.0000 mg | ORAL_TABLET | Freq: Every evening | ORAL | 2 refills | Status: DC

## 2019-12-16 DIAGNOSIS — C3411 Malignant neoplasm of upper lobe, right bronchus or lung: Secondary | ICD-10-CM | POA: Diagnosis not present

## 2019-12-16 DIAGNOSIS — S72401A Unspecified fracture of lower end of right femur, initial encounter for closed fracture: Secondary | ICD-10-CM | POA: Diagnosis not present

## 2019-12-16 DIAGNOSIS — C7931 Secondary malignant neoplasm of brain: Secondary | ICD-10-CM | POA: Diagnosis not present

## 2019-12-16 DIAGNOSIS — S82141D Displaced bicondylar fracture of right tibia, subsequent encounter for closed fracture with routine healing: Secondary | ICD-10-CM | POA: Diagnosis not present

## 2019-12-16 DIAGNOSIS — R339 Retention of urine, unspecified: Secondary | ICD-10-CM | POA: Diagnosis not present

## 2019-12-16 DIAGNOSIS — S90521D Blister (nonthermal), right ankle, subsequent encounter: Secondary | ICD-10-CM | POA: Diagnosis not present

## 2019-12-16 DIAGNOSIS — M810 Age-related osteoporosis without current pathological fracture: Secondary | ICD-10-CM | POA: Diagnosis not present

## 2019-12-16 DIAGNOSIS — S72351D Displaced comminuted fracture of shaft of right femur, subsequent encounter for closed fracture with routine healing: Secondary | ICD-10-CM | POA: Diagnosis not present

## 2019-12-16 DIAGNOSIS — Z7901 Long term (current) use of anticoagulants: Secondary | ICD-10-CM | POA: Diagnosis not present

## 2019-12-16 DIAGNOSIS — F0390 Unspecified dementia without behavioral disturbance: Secondary | ICD-10-CM | POA: Diagnosis not present

## 2019-12-17 DIAGNOSIS — C7931 Secondary malignant neoplasm of brain: Secondary | ICD-10-CM | POA: Diagnosis not present

## 2019-12-17 DIAGNOSIS — Z7901 Long term (current) use of anticoagulants: Secondary | ICD-10-CM | POA: Diagnosis not present

## 2019-12-17 DIAGNOSIS — C3411 Malignant neoplasm of upper lobe, right bronchus or lung: Secondary | ICD-10-CM | POA: Diagnosis not present

## 2019-12-17 DIAGNOSIS — M810 Age-related osteoporosis without current pathological fracture: Secondary | ICD-10-CM | POA: Diagnosis not present

## 2019-12-17 DIAGNOSIS — F0390 Unspecified dementia without behavioral disturbance: Secondary | ICD-10-CM | POA: Diagnosis not present

## 2019-12-17 DIAGNOSIS — S90521D Blister (nonthermal), right ankle, subsequent encounter: Secondary | ICD-10-CM | POA: Diagnosis not present

## 2019-12-17 DIAGNOSIS — S82141D Displaced bicondylar fracture of right tibia, subsequent encounter for closed fracture with routine healing: Secondary | ICD-10-CM | POA: Diagnosis not present

## 2019-12-17 DIAGNOSIS — R339 Retention of urine, unspecified: Secondary | ICD-10-CM | POA: Diagnosis not present

## 2019-12-17 DIAGNOSIS — S72351D Displaced comminuted fracture of shaft of right femur, subsequent encounter for closed fracture with routine healing: Secondary | ICD-10-CM | POA: Diagnosis not present

## 2019-12-18 ENCOUNTER — Inpatient Hospital Stay: Payer: Medicare PPO

## 2019-12-18 ENCOUNTER — Telehealth: Payer: Self-pay | Admitting: Medical Oncology

## 2019-12-18 NOTE — Telephone Encounter (Signed)
Requests all appts same day of CT scan. Schedule message sent. Transportation is an issue.

## 2019-12-22 ENCOUNTER — Ambulatory Visit: Payer: Medicare PPO | Admitting: Internal Medicine

## 2019-12-24 ENCOUNTER — Ambulatory Visit (HOSPITAL_COMMUNITY): Payer: Medicare PPO

## 2019-12-24 ENCOUNTER — Other Ambulatory Visit: Payer: Medicare PPO

## 2019-12-24 ENCOUNTER — Other Ambulatory Visit (HOSPITAL_COMMUNITY): Payer: Medicare PPO

## 2019-12-24 DIAGNOSIS — C3411 Malignant neoplasm of upper lobe, right bronchus or lung: Secondary | ICD-10-CM | POA: Diagnosis not present

## 2019-12-24 DIAGNOSIS — Z7901 Long term (current) use of anticoagulants: Secondary | ICD-10-CM | POA: Diagnosis not present

## 2019-12-24 DIAGNOSIS — S90521D Blister (nonthermal), right ankle, subsequent encounter: Secondary | ICD-10-CM | POA: Diagnosis not present

## 2019-12-24 DIAGNOSIS — F0390 Unspecified dementia without behavioral disturbance: Secondary | ICD-10-CM | POA: Diagnosis not present

## 2019-12-24 DIAGNOSIS — S82141D Displaced bicondylar fracture of right tibia, subsequent encounter for closed fracture with routine healing: Secondary | ICD-10-CM | POA: Diagnosis not present

## 2019-12-24 DIAGNOSIS — C7931 Secondary malignant neoplasm of brain: Secondary | ICD-10-CM | POA: Diagnosis not present

## 2019-12-24 DIAGNOSIS — R339 Retention of urine, unspecified: Secondary | ICD-10-CM | POA: Diagnosis not present

## 2019-12-24 DIAGNOSIS — S72351D Displaced comminuted fracture of shaft of right femur, subsequent encounter for closed fracture with routine healing: Secondary | ICD-10-CM | POA: Diagnosis not present

## 2019-12-24 DIAGNOSIS — M810 Age-related osteoporosis without current pathological fracture: Secondary | ICD-10-CM | POA: Diagnosis not present

## 2019-12-28 ENCOUNTER — Ambulatory Visit: Payer: Medicare PPO | Admitting: Internal Medicine

## 2019-12-29 ENCOUNTER — Other Ambulatory Visit: Payer: Self-pay | Admitting: Medical Oncology

## 2019-12-29 ENCOUNTER — Inpatient Hospital Stay: Payer: Medicare PPO | Attending: Internal Medicine

## 2019-12-29 ENCOUNTER — Encounter: Payer: Self-pay | Admitting: Internal Medicine

## 2019-12-29 ENCOUNTER — Other Ambulatory Visit: Payer: Self-pay

## 2019-12-29 ENCOUNTER — Ambulatory Visit (HOSPITAL_COMMUNITY)
Admission: RE | Admit: 2019-12-29 | Discharge: 2019-12-29 | Disposition: A | Discharge status: Home / Self Care | Payer: Medicare PPO | Source: Ambulatory Visit | Attending: Internal Medicine | Admitting: Internal Medicine

## 2019-12-29 ENCOUNTER — Inpatient Hospital Stay (HOSPITAL_BASED_OUTPATIENT_CLINIC_OR_DEPARTMENT_OTHER): Payer: Medicare PPO | Admitting: Internal Medicine

## 2019-12-29 ENCOUNTER — Encounter (HOSPITAL_COMMUNITY): Payer: Self-pay

## 2019-12-29 VITALS — BP 119/26 | HR 120 | Temp 98.2°F | Resp 18 | Ht 69.0 in

## 2019-12-29 DIAGNOSIS — C3490 Malignant neoplasm of unspecified part of unspecified bronchus or lung: Secondary | ICD-10-CM | POA: Diagnosis not present

## 2019-12-29 DIAGNOSIS — C770 Secondary and unspecified malignant neoplasm of lymph nodes of head, face and neck: Secondary | ICD-10-CM | POA: Diagnosis not present

## 2019-12-29 DIAGNOSIS — Z7901 Long term (current) use of anticoagulants: Secondary | ICD-10-CM | POA: Insufficient documentation

## 2019-12-29 DIAGNOSIS — C3432 Malignant neoplasm of lower lobe, left bronchus or lung: Secondary | ICD-10-CM | POA: Diagnosis not present

## 2019-12-29 DIAGNOSIS — I10 Essential (primary) hypertension: Secondary | ICD-10-CM | POA: Diagnosis not present

## 2019-12-29 DIAGNOSIS — C7931 Secondary malignant neoplasm of brain: Secondary | ICD-10-CM | POA: Diagnosis not present

## 2019-12-29 DIAGNOSIS — C7801 Secondary malignant neoplasm of right lung: Secondary | ICD-10-CM

## 2019-12-29 DIAGNOSIS — D638 Anemia in other chronic diseases classified elsewhere: Secondary | ICD-10-CM | POA: Diagnosis not present

## 2019-12-29 DIAGNOSIS — R531 Weakness: Secondary | ICD-10-CM | POA: Diagnosis not present

## 2019-12-29 DIAGNOSIS — C3411 Malignant neoplasm of upper lobe, right bronchus or lung: Secondary | ICD-10-CM

## 2019-12-29 DIAGNOSIS — Z85850 Personal history of malignant neoplasm of thyroid: Secondary | ICD-10-CM | POA: Diagnosis not present

## 2019-12-29 DIAGNOSIS — R918 Other nonspecific abnormal finding of lung field: Secondary | ICD-10-CM | POA: Diagnosis not present

## 2019-12-29 DIAGNOSIS — Z5111 Encounter for antineoplastic chemotherapy: Secondary | ICD-10-CM

## 2019-12-29 DIAGNOSIS — S82141D Displaced bicondylar fracture of right tibia, subsequent encounter for closed fracture with routine healing: Secondary | ICD-10-CM | POA: Diagnosis not present

## 2019-12-29 LAB — CBC WITH DIFFERENTIAL (CANCER CENTER ONLY)
Abs Immature Granulocytes: 0.01 K/uL (ref 0.00–0.07)
Basophils Absolute: 0 K/uL (ref 0.0–0.1)
Basophils Relative: 0 %
Eosinophils Absolute: 0.2 K/uL (ref 0.0–0.5)
Eosinophils Relative: 2 %
HCT: 38.5 % (ref 36.0–46.0)
Hemoglobin: 11.8 g/dL — ABNORMAL LOW (ref 12.0–15.0)
Immature Granulocytes: 0 %
Lymphocytes Relative: 15 %
Lymphs Abs: 1.1 K/uL (ref 0.7–4.0)
MCH: 29.1 pg (ref 26.0–34.0)
MCHC: 30.6 g/dL (ref 30.0–36.0)
MCV: 94.8 fL (ref 80.0–100.0)
Monocytes Absolute: 0.5 K/uL (ref 0.1–1.0)
Monocytes Relative: 7 %
Neutro Abs: 5.8 K/uL (ref 1.7–7.7)
Neutrophils Relative %: 76 %
Platelet Count: 285 K/uL (ref 150–400)
RBC: 4.06 MIL/uL (ref 3.87–5.11)
RDW: 14.2 % (ref 11.5–15.5)
WBC Count: 7.6 K/uL (ref 4.0–10.5)
nRBC: 0 % (ref 0.0–0.2)

## 2019-12-29 LAB — CMP (CANCER CENTER ONLY)
ALT: 7 U/L (ref 0–44)
AST: 9 U/L — ABNORMAL LOW (ref 15–41)
Albumin: 3.8 g/dL (ref 3.5–5.0)
Alkaline Phosphatase: 67 U/L (ref 38–126)
Anion gap: 10 (ref 5–15)
BUN: 13 mg/dL (ref 8–23)
CO2: 24 mmol/L (ref 22–32)
Calcium: 9.9 mg/dL (ref 8.9–10.3)
Chloride: 106 mmol/L (ref 98–111)
Creatinine: 1.07 mg/dL — ABNORMAL HIGH (ref 0.44–1.00)
GFR, Est AFR Am: 59 mL/min — ABNORMAL LOW (ref >60)
GFR, Estimated: 51 mL/min — ABNORMAL LOW (ref >60)
Glucose, Bld: 93 mg/dL (ref 70–99)
Potassium: 4 mmol/L (ref 3.5–5.1)
Sodium: 140 mmol/L (ref 135–145)
Total Bilirubin: 0.4 mg/dL (ref 0.3–1.2)
Total Protein: 6.8 g/dL (ref 6.5–8.1)

## 2019-12-29 MED ORDER — SODIUM CHLORIDE (PF) 0.9 % IJ SOLN
INTRAMUSCULAR | Status: AC
  Filled 2019-12-29: qty 50

## 2019-12-29 MED ORDER — IOHEXOL 300 MG/ML  SOLN
75.0000 mL | Freq: Once | INTRAMUSCULAR | Status: AC | PRN
  Administered 2019-12-29: 75 mL via INTRAVENOUS

## 2019-12-29 NOTE — Progress Notes (Signed)
[facility] [phone]   [phone]  OFFICE PROGRESS NOTE  [name], [address] [place] [address]  PRINCIPAL DIAGNOSIS:  metastatic non-small cell lung cancer, adenocarcinoma with brain metastases initially diagnosed as a stage IIb in October 2002.   BIOMARKERS TESTING ([facility]): Positive for: EGFR [id number] del, T790M, CTNNB1 D32G, SMAD4 T139f*10 Negative for: RET, ALK, BRAF, KRAS, ERBB2 and MET  PRIOR THERAPY:  1. Status post left lower lobectomy under the care of Dr BArlyce Dicein October 2002. 2. Status post course of concurrent chemoradiation with weekly carboplatin and paclitaxel under the care of Dr SBenay Spiceand Dr WElba Barmanin early 2003. 3. Status post treatment [facility]x [facility] 4. Status post left occipital craniotomy for tumor resection on September 21, 2005, under the care of Dr CChristella Noa 5. Status post whole brain irradiation under the care of Dr WElba Barman completed November 07, 2005. 6. Status post gamma knife treatment [facility] the resected [facility] 7. Status post right thyroidectomy with radical neck dissection under the care of Dr. RConstance Holsteron 10/18/2011. 8. Status post excisional biopsy of right cervical lymph node that was consistent with metastatic adenocarcinoma. 9. Tarceva 150 mg by mouth daily with therapy beginning 06/03/2007, discontinued in July 2015 secondary to disease progression. Status post 79 cycles. 10. palliative radiotherapy to the enlarging right upper lobe lung nodule under the care of Dr. MValere Dross [id number] 300 mg by mouth daily for 21 days discontinued secondary to study deviation and [facility]. 12. Open reduction and  internal fixation of trimalleolar ankle fracture dislocation with fixation of the fibula as well as medial malleolus.   CURRENT THERAPY: Tagrisso 80 mg by mouth daily started 11/25/2014, status post 61 months of treatment.  INTERVAL HISTORY: Valerie ARVANITIS74y.o. female returns to the clinic today for follow-up visit accompanied by her husband.  The patient is feeling fine today with no concerning complaints except for persistent weakness in the lower extremities.  She has been undergoing physical therapy but no significant in her weakness and she is unable to walk for any distance at this point.  She is able to stand.  She denied having any current chest pain, shortness of breath, cough or hemoptysis.  She denied having any fever or chills.  She has no nausea, vomiting, diarrhea or constipation.  She has no headache or visual changes.  She had CT scan of the abdomen last month that was unremarkable for any metastatic disease and showed only constipation.  The patient had CT of the neck and chest performed earlier today and she is here for evaluation and discussion of her scans and recommendation regarding her condition.  She has been tolerating her treatment with Tagrisso fairly well.  MEDICAL HISTORY: Past Medical History:  Diagnosis Date  . Abnormal Pap smear 2006  . Anal fistula   . Ankle fracture   . Anxiety   . Arthritis   . Atrophic vaginitis 2008  . Cataract   . [place] [place]  . Dyspareunia 2008  . H/O osteoporosis   . H/O varicella   . H/O vitamin D deficiency   . Headache 07/24/2016  . Heart murmur   .  History of measles, mumps, or rubella   . History of radiation therapy 07/28/13- 08/10/13   right lung metastasis 5000 cGy 10 sessions  . Hypertension   . Lung cancer ([place]) dx'd 2002  . Lung cancer ([place])   . Lung cancer ([place])   . Lung metastasis ([place])    PET scan 05/05/13, RUL lung nodule  . Metastasis to brain [facility]) dx'd 2008  . 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 Nodule of right lung CT- 06/03/12   RIGHT UPPER LOBE  . Nodule of right lung 06/03/12   Upper Lobe  . On antineoplastic chemotherapy    TARCEVA  . Osteoporosis 2010  . Primary cancer of right upper lobe of lung ([place]) 04/22/2009   Qualifier: Diagnosis of  By: Nils Pyle CMA ([place]), Mearl Latin    . Shortness of breath    hx lung ca   . Status post chemotherapy 2003   CARBOPLATIN/PACLITAXEL /STATUS POST CLINICAL TRAIL OF CELEBREX AND IRESSA AT [facility] FOR 1 YEAR  . Status post radiation therapy 2003   LEFT LUNG  . Status post radiation therapy 11/07/2005   WHOLE BRAIN: DR Larkin [facility]  . Status post radiation therapy 06/02/2008   GAMMA KNIFE OF RESECTED CAVITAY  . Thyroid adenoma    ?  [name] Thyroid cancer ([place]) 10/18/11 bx   adenoid nodules   . Yeast infection     ALLERGIES:  is allergic to keflex [cephalexin].  MEDICATIONS:  Current Outpatient Medications  Medication Sig Dispense Refill  . Ascorbic Acid (VITAMIN C) 100 MG tablet Take 100 mg by mouth every evening. Gummie    . cholecalciferol (VITAMIN D) 1000 UNITS tablet Take 1,000 Units by mouth every evening.     . diphenoxylate-atropine (LOMOTIL) 2.5-0.025 MG tablet TAKE 1 TABLET BY MOUTH THREE TIMES DAILY AS NEEDED FOR DIARRHEA OR LOOSE STOOLS 30 tablet 0  . donepezil (ARICEPT) 10 MG tablet Take 1 tablet (10 mg total) by mouth every evening. 90 tablet 2  . Ibuprofen (ADVIL) 200 MG CAPS Take by mouth. Prn    . loperamide (IMODIUM) 2 MG capsule Take 2 mg by mouth as needed for diarrhea or loose stools.     [name] MAGNESIUM PO Take 1 tablet by mouth daily.    . metroNIDAZOLE (FLAGYL) 500 MG tablet Take 1 tablet (500 mg total) by mouth 2 (two) times daily. 14 tablet 0  . Multiple Vitamin (MULTI-VITAMINS) TABS Take 1 tablet by mouth every evening.     . nitrofurantoin, macrocrystal-monohydrate, (MACROBID) 100 MG capsule Take 1 capsule (100 mg total) by mouth 2 (two) times daily. 14 capsule 0  . polyethylene glycol (MIRALAX / GLYCOLAX)  17 g packet Take 17 g by mouth daily. (Patient not taking: Reported on 11/17/2019) 14 each 0  . TAGRISSO 80 MG tablet TAKE 1 TABLET ('80MG'$ ) BY MOUTH DAILY. 30 tablet 2  . vitamin B-12 (CYANOCOBALAMIN) 1000 MCG tablet Take 1,000 mcg by mouth every evening.    [name] 20 MG TABS tablet Take 1 tablet (20 mg total) by mouth every evening. 90 tablet 1   No current facility-administered medications for this visit.   Facility-Administered Medications Ordered in Other Visits  Medication Dose Route Frequency Provider Last Rate Last Admin  . sodium chloride (PF) 0.9 % injection             SURGICAL HISTORY:  Past Surgical History:  Procedure Laterality Date  . BRAIN SURGERY    . LEFT LOWER LOBECTOMY  05/2001  .  LEFT OCCIPITAL CRANIOTOMY  09/21/2005   tumor  . LUNG LOBECTOMY    . NECK SURGERY    . ORIF ANKLE FRACTURE Left 10/02/2014   Procedure: OPEN REDUCTION INTERNAL FIXATION (ORIF) ANKLE FRACTURE;  Surgeon: Alta Corning, MD;  Location: [facility];  Service: Orthopedics;  Laterality: Left;  . ORIF FEMUR FRACTURE Right 06/11/2019   Procedure: OPEN REDUCTION INTERNAL FIXATION (ORIF) DISTAL FEMUR FRACTURE;  Surgeon: Renette Butters, MD;  Location: [facility];  Service: Orthopedics;  Laterality: Right;  . ORIF TIBIA PLATEAU Right 06/11/2019   Procedure: Open Reduction Internal Fixation (Orif) Tibial Plateau;  Surgeon: Renette Butters, MD;  Location: [place];  Service: Orthopedics;  Laterality: Right;  . RADICAL NECK DISSECTION  10/18/2011   Procedure: RADICAL NECK DISSECTION;  Surgeon: Izora Gala, MD;  Location: [facility];  Service: ENT;  Laterality: Right;  RIGHT MODIFIED NECK DISSECTION /POSSIBLE RIGHT THYROIDECTOM  . THYROIDECTOMY  10/18/2011   Procedure: THYROIDECTOMY WITH RADICAL NECK DISSECTION;  Surgeon: Izora Gala, MD;  Location: [facility];  Service: ENT;  Laterality: Right;    REVIEW OF SYSTEMS:  Constitutional: positive for fatigue Eyes: negative Ears, nose, mouth, throat, and face:  negative Respiratory: negative Cardiovascular: negative Gastrointestinal: negative Genitourinary:negative Integument/breast: negative Hematologic/lymphatic: negative Musculoskeletal:positive for muscle weakness Neurological: positive for weakness Behavioral/Psych: negative Endocrine: negative Allergic/Immunologic: negative   PHYSICAL EXAMINATION: General appearance: alert, cooperative, fatigued and no distress Head: Normocephalic, without obvious abnormality, atraumatic Neck: no adenopathy, supple, symmetrical, trachea midline and thyroid not enlarged, symmetric, no tenderness/mass/nodules Lymph nodes: Cervical, supraclavicular, and axillary nodes normal. Resp: clear to auscultation bilaterally Back: symmetric, no curvature. ROM normal. No CVA tenderness. Cardio: regular rate and rhythm, S1, S2 normal, no murmur, click, rub or gallop GI: soft, non-tender; bowel sounds normal; no masses,  no organomegaly Extremities: extremities normal, atraumatic, no cyanosis or edema Neurologic: Motor: Weakness of the lower extremities bilaterally  ECOG PERFORMANCE STATUS: 1 - Symptomatic but completely ambulatory  Blood pressure (!) 119/26, pulse (!) 120, temperature 98.2 F (36.8 C), temperature source Temporal, resp. rate 18, height '5\' 9"'$  (1.753 m), SpO2 96 %.  LABORATORY DATA: Lab Results  Component Value Date   WBC 7.6 12/29/2019   HGB 11.8 (L) 12/29/2019   HCT 38.5 12/29/2019   MCV 94.8 12/29/2019   PLT 285 12/29/2019      Chemistry      Component Value Date/Time   NA 140 12/29/2019 1302   NA 148 (H) 10/16/2018 1300   NA 141 08/06/2017 1047   K 4.0 12/29/2019 1302   K 3.9 08/06/2017 1047   CL 106 12/29/2019 1302   CL 106 12/09/2012 0806   CO2 24 12/29/2019 1302   CO2 24 08/06/2017 1047   BUN 13 12/29/2019 1302   BUN 16 10/16/2018 1300   BUN 15.4 08/06/2017 1047   CREATININE 1.07 (H) 12/29/2019 1302   CREATININE 1.0 08/06/2017 1047      Component Value Date/Time    CALCIUM 9.9 12/29/2019 1302   CALCIUM 9.8 08/06/2017 1047   ALKPHOS 67 12/29/2019 1302   ALKPHOS 65 08/06/2017 1047   AST 9 (L) 12/29/2019 1302   AST 11 08/06/2017 1047   ALT 7 12/29/2019 1302   ALT 9 08/06/2017 1047   BILITOT 0.4 12/29/2019 1302   BILITOT 0.66 08/06/2017 1047       RADIOGRAPHIC STUDIES: No results found.  ASSESSMENT AND PLAN: This is a very pleasant 74 years old African-American female with metastatic non-small cell lung cancer, adenocarcinoma with  positive EGFR mutation status post several years of treatment with Tarceva and the patient developed positive resistant mutation, T790M. She has been on treatment with Tagrisso for the last 5 years, status post 61 months of treatment.  The patient has been tolerating her treatment with Tagrisso fairly well. She had CT scan of the abdomen last month that was unremarkable for any metastatic disease. She had CT scan of the neck and chest performed earlier today.  I personally and independently reviewed the scan images and discussed the results with the patient and her husband. I did not see any significant concerning findings for disease progression except for enlarging left pleural effusion but the final report for the scan still pending. I recommended for the patient to continue her current treatment with Tagrisso with the same dose.  If there is any concerning findings on the CT scan, I will call them with further recommendation. For the anemia of chronic disease, she has improvement in her hemoglobin and hematocrit.  I recommended for her to continue with the oral iron tablets. For the weakness of the lower extremities, the patient will continue her current physical therapy. She will come back for follow-up visit in 3 months for evaluation and repeat blood work. She was advised to call immediately if she has any concerning symptoms in the interval.   The patient voices understanding of current disease status and treatment  options and is in agreement with the current care plan. All questions were answered. The patient knows to call the clinic with any problems, questions or concerns. We can certainly see the patient much sooner if necessary.  Disclaimer: This note was dictated with voice recognition software. Similar sounding words can inadvertently be transcribed and may not be corrected upon review.

## 2019-12-30 DIAGNOSIS — C7931 Secondary malignant neoplasm of brain: Secondary | ICD-10-CM | POA: Diagnosis not present

## 2019-12-30 DIAGNOSIS — S90521D Blister (nonthermal), right ankle, subsequent encounter: Secondary | ICD-10-CM | POA: Diagnosis not present

## 2019-12-30 DIAGNOSIS — R339 Retention of urine, unspecified: Secondary | ICD-10-CM | POA: Diagnosis not present

## 2019-12-30 DIAGNOSIS — S82141D Displaced bicondylar fracture of right tibia, subsequent encounter for closed fracture with routine healing: Secondary | ICD-10-CM | POA: Diagnosis not present

## 2019-12-30 DIAGNOSIS — C3411 Malignant neoplasm of upper lobe, right bronchus or lung: Secondary | ICD-10-CM | POA: Diagnosis not present

## 2019-12-30 DIAGNOSIS — F0390 Unspecified dementia without behavioral disturbance: Secondary | ICD-10-CM | POA: Diagnosis not present

## 2019-12-30 DIAGNOSIS — M810 Age-related osteoporosis without current pathological fracture: Secondary | ICD-10-CM | POA: Diagnosis not present

## 2019-12-30 DIAGNOSIS — Z7901 Long term (current) use of anticoagulants: Secondary | ICD-10-CM | POA: Diagnosis not present

## 2019-12-30 DIAGNOSIS — S72351D Displaced comminuted fracture of shaft of right femur, subsequent encounter for closed fracture with routine healing: Secondary | ICD-10-CM | POA: Diagnosis not present

## 2019-12-31 ENCOUNTER — Telehealth: Payer: Self-pay | Admitting: Internal Medicine

## 2019-12-31 ENCOUNTER — Telehealth: Payer: Self-pay | Admitting: *Deleted

## 2019-12-31 ENCOUNTER — Encounter: Payer: Medicare PPO | Admitting: Nurse Practitioner

## 2019-12-31 ENCOUNTER — Other Ambulatory Visit: Payer: Self-pay | Admitting: Internal Medicine

## 2019-12-31 ENCOUNTER — Ambulatory Visit: Payer: Medicare Other

## 2019-12-31 DIAGNOSIS — M810 Age-related osteoporosis without current pathological fracture: Secondary | ICD-10-CM | POA: Diagnosis not present

## 2019-12-31 DIAGNOSIS — S90521D Blister (nonthermal), right ankle, subsequent encounter: Secondary | ICD-10-CM | POA: Diagnosis not present

## 2019-12-31 DIAGNOSIS — Z7901 Long term (current) use of anticoagulants: Secondary | ICD-10-CM | POA: Diagnosis not present

## 2019-12-31 DIAGNOSIS — R339 Retention of urine, unspecified: Secondary | ICD-10-CM | POA: Diagnosis not present

## 2019-12-31 DIAGNOSIS — S82141D Displaced bicondylar fracture of right tibia, subsequent encounter for closed fracture with routine healing: Secondary | ICD-10-CM | POA: Diagnosis not present

## 2019-12-31 DIAGNOSIS — C3411 Malignant neoplasm of upper lobe, right bronchus or lung: Secondary | ICD-10-CM | POA: Diagnosis not present

## 2019-12-31 DIAGNOSIS — S72351D Displaced comminuted fracture of shaft of right femur, subsequent encounter for closed fracture with routine healing: Secondary | ICD-10-CM | POA: Diagnosis not present

## 2019-12-31 DIAGNOSIS — F0390 Unspecified dementia without behavioral disturbance: Secondary | ICD-10-CM | POA: Diagnosis not present

## 2019-12-31 DIAGNOSIS — C3490 Malignant neoplasm of unspecified part of unspecified bronchus or lung: Secondary | ICD-10-CM

## 2019-12-31 DIAGNOSIS — C7931 Secondary malignant neoplasm of brain: Secondary | ICD-10-CM | POA: Diagnosis not present

## 2019-12-31 NOTE — Telephone Encounter (Signed)
Scheduled per los. Called and spoke with patients husband. Confirmed appt

## 2019-12-31 NOTE — Telephone Encounter (Signed)
Okay to order MRI of the brain 1 or 2 days before her next visit in 3 months.

## 2019-12-31 NOTE — Telephone Encounter (Signed)
Received vm message from patient. She saw Dr. Julien Nordmann on 12/29/19. She is calling to day requesting to have an MRI done of her Brain near the time of her next appt with Dr. Julien Nordmann.  TCT patient. Spoke with her.  She states she wants a Brain MRI because her head hurts sometimes.  She knows that she and Dr. Julien Nordmann did not talk about this at her last visit.  Please advise.

## 2020-01-01 ENCOUNTER — Telehealth: Payer: Self-pay | Admitting: Medical Oncology

## 2020-01-01 ENCOUNTER — Other Ambulatory Visit: Payer: Self-pay | Admitting: Medical Oncology

## 2020-01-01 NOTE — Telephone Encounter (Signed)
August 11 th Appts : given to pt.and husband .  0900-labs  1000-MRI brain  1115- Dr Julien Nordmann

## 2020-01-01 NOTE — Telephone Encounter (Signed)
Pt needs all appts on same day august 11th.

## 2020-01-05 DIAGNOSIS — C7931 Secondary malignant neoplasm of brain: Secondary | ICD-10-CM | POA: Diagnosis not present

## 2020-01-05 DIAGNOSIS — Z7901 Long term (current) use of anticoagulants: Secondary | ICD-10-CM | POA: Diagnosis not present

## 2020-01-05 DIAGNOSIS — S72351D Displaced comminuted fracture of shaft of right femur, subsequent encounter for closed fracture with routine healing: Secondary | ICD-10-CM | POA: Diagnosis not present

## 2020-01-05 DIAGNOSIS — S82141D Displaced bicondylar fracture of right tibia, subsequent encounter for closed fracture with routine healing: Secondary | ICD-10-CM | POA: Diagnosis not present

## 2020-01-05 DIAGNOSIS — M810 Age-related osteoporosis without current pathological fracture: Secondary | ICD-10-CM | POA: Diagnosis not present

## 2020-01-05 DIAGNOSIS — R339 Retention of urine, unspecified: Secondary | ICD-10-CM | POA: Diagnosis not present

## 2020-01-05 DIAGNOSIS — F0390 Unspecified dementia without behavioral disturbance: Secondary | ICD-10-CM | POA: Diagnosis not present

## 2020-01-05 DIAGNOSIS — C3411 Malignant neoplasm of upper lobe, right bronchus or lung: Secondary | ICD-10-CM | POA: Diagnosis not present

## 2020-01-05 DIAGNOSIS — S90521D Blister (nonthermal), right ankle, subsequent encounter: Secondary | ICD-10-CM | POA: Diagnosis not present

## 2020-01-07 DIAGNOSIS — S82141D Displaced bicondylar fracture of right tibia, subsequent encounter for closed fracture with routine healing: Secondary | ICD-10-CM | POA: Diagnosis not present

## 2020-01-07 DIAGNOSIS — R339 Retention of urine, unspecified: Secondary | ICD-10-CM | POA: Diagnosis not present

## 2020-01-07 DIAGNOSIS — C3411 Malignant neoplasm of upper lobe, right bronchus or lung: Secondary | ICD-10-CM | POA: Diagnosis not present

## 2020-01-07 DIAGNOSIS — S72351D Displaced comminuted fracture of shaft of right femur, subsequent encounter for closed fracture with routine healing: Secondary | ICD-10-CM | POA: Diagnosis not present

## 2020-01-07 DIAGNOSIS — M810 Age-related osteoporosis without current pathological fracture: Secondary | ICD-10-CM | POA: Diagnosis not present

## 2020-01-07 DIAGNOSIS — S90521D Blister (nonthermal), right ankle, subsequent encounter: Secondary | ICD-10-CM | POA: Diagnosis not present

## 2020-01-07 DIAGNOSIS — Z7901 Long term (current) use of anticoagulants: Secondary | ICD-10-CM | POA: Diagnosis not present

## 2020-01-07 DIAGNOSIS — F0390 Unspecified dementia without behavioral disturbance: Secondary | ICD-10-CM | POA: Diagnosis not present

## 2020-01-07 DIAGNOSIS — C7931 Secondary malignant neoplasm of brain: Secondary | ICD-10-CM | POA: Diagnosis not present

## 2020-01-08 MED FILL — TAGRISSO 80 MG TABLET: 80 | 30 days supply | Qty: 30 | Fill #1

## 2020-01-12 DIAGNOSIS — S82141D Displaced bicondylar fracture of right tibia, subsequent encounter for closed fracture with routine healing: Secondary | ICD-10-CM | POA: Diagnosis not present

## 2020-01-12 DIAGNOSIS — C3411 Malignant neoplasm of upper lobe, right bronchus or lung: Secondary | ICD-10-CM | POA: Diagnosis not present

## 2020-01-12 DIAGNOSIS — C7931 Secondary malignant neoplasm of brain: Secondary | ICD-10-CM | POA: Diagnosis not present

## 2020-01-12 DIAGNOSIS — S90521D Blister (nonthermal), right ankle, subsequent encounter: Secondary | ICD-10-CM | POA: Diagnosis not present

## 2020-01-12 DIAGNOSIS — F0390 Unspecified dementia without behavioral disturbance: Secondary | ICD-10-CM | POA: Diagnosis not present

## 2020-01-12 DIAGNOSIS — M810 Age-related osteoporosis without current pathological fracture: Secondary | ICD-10-CM | POA: Diagnosis not present

## 2020-01-12 DIAGNOSIS — Z7901 Long term (current) use of anticoagulants: Secondary | ICD-10-CM | POA: Diagnosis not present

## 2020-01-12 DIAGNOSIS — S72351D Displaced comminuted fracture of shaft of right femur, subsequent encounter for closed fracture with routine healing: Secondary | ICD-10-CM | POA: Diagnosis not present

## 2020-01-12 DIAGNOSIS — R339 Retention of urine, unspecified: Secondary | ICD-10-CM | POA: Diagnosis not present

## 2020-01-14 DIAGNOSIS — F0390 Unspecified dementia without behavioral disturbance: Secondary | ICD-10-CM | POA: Diagnosis not present

## 2020-01-14 DIAGNOSIS — S72351D Displaced comminuted fracture of shaft of right femur, subsequent encounter for closed fracture with routine healing: Secondary | ICD-10-CM | POA: Diagnosis not present

## 2020-01-14 DIAGNOSIS — Z7901 Long term (current) use of anticoagulants: Secondary | ICD-10-CM | POA: Diagnosis not present

## 2020-01-14 DIAGNOSIS — C3411 Malignant neoplasm of upper lobe, right bronchus or lung: Secondary | ICD-10-CM | POA: Diagnosis not present

## 2020-01-14 DIAGNOSIS — S90521D Blister (nonthermal), right ankle, subsequent encounter: Secondary | ICD-10-CM | POA: Diagnosis not present

## 2020-01-14 DIAGNOSIS — M810 Age-related osteoporosis without current pathological fracture: Secondary | ICD-10-CM | POA: Diagnosis not present

## 2020-01-14 DIAGNOSIS — R339 Retention of urine, unspecified: Secondary | ICD-10-CM | POA: Diagnosis not present

## 2020-01-14 DIAGNOSIS — S82141D Displaced bicondylar fracture of right tibia, subsequent encounter for closed fracture with routine healing: Secondary | ICD-10-CM | POA: Diagnosis not present

## 2020-01-14 DIAGNOSIS — C7931 Secondary malignant neoplasm of brain: Secondary | ICD-10-CM | POA: Diagnosis not present

## 2020-01-15 DIAGNOSIS — S72401A Unspecified fracture of lower end of right femur, initial encounter for closed fracture: Secondary | ICD-10-CM | POA: Diagnosis not present

## 2020-01-19 DIAGNOSIS — Z7901 Long term (current) use of anticoagulants: Secondary | ICD-10-CM | POA: Diagnosis not present

## 2020-01-19 DIAGNOSIS — S82141D Displaced bicondylar fracture of right tibia, subsequent encounter for closed fracture with routine healing: Secondary | ICD-10-CM | POA: Diagnosis not present

## 2020-01-19 DIAGNOSIS — M810 Age-related osteoporosis without current pathological fracture: Secondary | ICD-10-CM | POA: Diagnosis not present

## 2020-01-19 DIAGNOSIS — S72351D Displaced comminuted fracture of shaft of right femur, subsequent encounter for closed fracture with routine healing: Secondary | ICD-10-CM | POA: Diagnosis not present

## 2020-01-19 DIAGNOSIS — R339 Retention of urine, unspecified: Secondary | ICD-10-CM | POA: Diagnosis not present

## 2020-01-19 DIAGNOSIS — F0390 Unspecified dementia without behavioral disturbance: Secondary | ICD-10-CM | POA: Diagnosis not present

## 2020-01-19 DIAGNOSIS — C7931 Secondary malignant neoplasm of brain: Secondary | ICD-10-CM | POA: Diagnosis not present

## 2020-01-19 DIAGNOSIS — S90521D Blister (nonthermal), right ankle, subsequent encounter: Secondary | ICD-10-CM | POA: Diagnosis not present

## 2020-01-19 DIAGNOSIS — C3411 Malignant neoplasm of upper lobe, right bronchus or lung: Secondary | ICD-10-CM | POA: Diagnosis not present

## 2020-01-28 DIAGNOSIS — S72351D Displaced comminuted fracture of shaft of right femur, subsequent encounter for closed fracture with routine healing: Secondary | ICD-10-CM | POA: Diagnosis not present

## 2020-01-28 DIAGNOSIS — S90521D Blister (nonthermal), right ankle, subsequent encounter: Secondary | ICD-10-CM | POA: Diagnosis not present

## 2020-01-28 DIAGNOSIS — C3411 Malignant neoplasm of upper lobe, right bronchus or lung: Secondary | ICD-10-CM | POA: Diagnosis not present

## 2020-01-28 DIAGNOSIS — M810 Age-related osteoporosis without current pathological fracture: Secondary | ICD-10-CM | POA: Diagnosis not present

## 2020-01-28 DIAGNOSIS — Z7901 Long term (current) use of anticoagulants: Secondary | ICD-10-CM | POA: Diagnosis not present

## 2020-01-28 DIAGNOSIS — S82141D Displaced bicondylar fracture of right tibia, subsequent encounter for closed fracture with routine healing: Secondary | ICD-10-CM | POA: Diagnosis not present

## 2020-01-28 DIAGNOSIS — R339 Retention of urine, unspecified: Secondary | ICD-10-CM | POA: Diagnosis not present

## 2020-01-28 DIAGNOSIS — C7931 Secondary malignant neoplasm of brain: Secondary | ICD-10-CM | POA: Diagnosis not present

## 2020-01-28 DIAGNOSIS — F0390 Unspecified dementia without behavioral disturbance: Secondary | ICD-10-CM | POA: Diagnosis not present

## 2020-01-29 ENCOUNTER — Other Ambulatory Visit: Payer: Self-pay

## 2020-01-29 ENCOUNTER — Ambulatory Visit: Payer: Medicare PPO | Admitting: Podiatry

## 2020-01-29 DIAGNOSIS — C719 Malignant neoplasm of brain, unspecified: Secondary | ICD-10-CM | POA: Diagnosis not present

## 2020-01-29 DIAGNOSIS — M79676 Pain in unspecified toe(s): Secondary | ICD-10-CM | POA: Diagnosis not present

## 2020-01-29 DIAGNOSIS — K5900 Constipation, unspecified: Secondary | ICD-10-CM | POA: Diagnosis not present

## 2020-01-29 DIAGNOSIS — C7800 Secondary malignant neoplasm of unspecified lung: Secondary | ICD-10-CM | POA: Diagnosis not present

## 2020-01-29 DIAGNOSIS — L600 Ingrowing nail: Secondary | ICD-10-CM

## 2020-01-29 DIAGNOSIS — D638 Anemia in other chronic diseases classified elsewhere: Secondary | ICD-10-CM | POA: Diagnosis not present

## 2020-01-29 DIAGNOSIS — F0390 Unspecified dementia without behavioral disturbance: Secondary | ICD-10-CM | POA: Diagnosis not present

## 2020-02-04 MED FILL — TAGRISSO 80 MG TABLET: 80 | 30 days supply | Qty: 30 | Fill #2

## 2020-02-04 NOTE — Progress Notes (Signed)
  Subjective:  Patient ID: Valerie [name], female    DOB: [birth year],  MRN: [id number]  Chief Complaint  Patient presents with  . Nail Problem    Right 1st toenail lateral border ingrown nail. Pt states drainage, 1 yr duration. Pt states it has flare ups.     74 y.o. female presents with the above complaint. History confirmed with patient.   Objective:  Physical Exam: warm, good capillary refill, no trophic changes or ulcerative lesions, reduced DP and PT pulses and normal sensory exam.  Painful ingrowing nail at  medial border of the right, hallux; without warmth, erythema or drainage  Assessment:   1. Ingrown nail   2. Pain around toenail      Plan:  Patient was evaluated and treated and all questions answered.  Ingrown Nail, right  -Partial avulsion as below. No tourniquet used  Procedure: Avulsion of toenail Location: Right 1st toe  Anesthesia: Lidocaine 1% plain; 1.5 mL and Marcaine 0.5% plain; 1.5 mL, digital block. Skin Prep: Betadine. Dressing: Silvadene; telfa; dry, sterile dressing Technique: Following skin prep,  the nail border was freed and avulsed with a hemostat. The area was cleansed. Sterile dressing applied. Disposition: Patient tolerated procedure well.    No follow-ups on file.   MDM

## 2020-02-05 DIAGNOSIS — S82141D Displaced bicondylar fracture of right tibia, subsequent encounter for closed fracture with routine healing: Secondary | ICD-10-CM | POA: Diagnosis not present

## 2020-02-05 DIAGNOSIS — Z7901 Long term (current) use of anticoagulants: Secondary | ICD-10-CM | POA: Diagnosis not present

## 2020-02-05 DIAGNOSIS — S72351D Displaced comminuted fracture of shaft of right femur, subsequent encounter for closed fracture with routine healing: Secondary | ICD-10-CM | POA: Diagnosis not present

## 2020-02-05 DIAGNOSIS — S90521D Blister (nonthermal), right ankle, subsequent encounter: Secondary | ICD-10-CM | POA: Diagnosis not present

## 2020-02-05 DIAGNOSIS — C3411 Malignant neoplasm of upper lobe, right bronchus or lung: Secondary | ICD-10-CM | POA: Diagnosis not present

## 2020-02-05 DIAGNOSIS — C7931 Secondary malignant neoplasm of brain: Secondary | ICD-10-CM | POA: Diagnosis not present

## 2020-02-05 DIAGNOSIS — R339 Retention of urine, unspecified: Secondary | ICD-10-CM | POA: Diagnosis not present

## 2020-02-05 DIAGNOSIS — M810 Age-related osteoporosis without current pathological fracture: Secondary | ICD-10-CM | POA: Diagnosis not present

## 2020-02-05 DIAGNOSIS — F0390 Unspecified dementia without behavioral disturbance: Secondary | ICD-10-CM | POA: Diagnosis not present

## 2020-02-12 DIAGNOSIS — S72351D Displaced comminuted fracture of shaft of right femur, subsequent encounter for closed fracture with routine healing: Secondary | ICD-10-CM | POA: Diagnosis not present

## 2020-02-12 DIAGNOSIS — C3411 Malignant neoplasm of upper lobe, right bronchus or lung: Secondary | ICD-10-CM | POA: Diagnosis not present

## 2020-02-12 DIAGNOSIS — Z7901 Long term (current) use of anticoagulants: Secondary | ICD-10-CM | POA: Diagnosis not present

## 2020-02-12 DIAGNOSIS — S82141D Displaced bicondylar fracture of right tibia, subsequent encounter for closed fracture with routine healing: Secondary | ICD-10-CM | POA: Diagnosis not present

## 2020-02-12 DIAGNOSIS — M810 Age-related osteoporosis without current pathological fracture: Secondary | ICD-10-CM | POA: Diagnosis not present

## 2020-02-12 DIAGNOSIS — C7931 Secondary malignant neoplasm of brain: Secondary | ICD-10-CM | POA: Diagnosis not present

## 2020-02-12 DIAGNOSIS — F0390 Unspecified dementia without behavioral disturbance: Secondary | ICD-10-CM | POA: Diagnosis not present

## 2020-02-12 DIAGNOSIS — R339 Retention of urine, unspecified: Secondary | ICD-10-CM | POA: Diagnosis not present

## 2020-02-12 DIAGNOSIS — S90521D Blister (nonthermal), right ankle, subsequent encounter: Secondary | ICD-10-CM | POA: Diagnosis not present

## 2020-02-14 ENCOUNTER — Inpatient Hospital Stay (HOSPITAL_COMMUNITY)
Admission: EM | Admit: 2020-02-14 | Discharge: 2020-02-18 | DRG: 287 | Disposition: A | Discharge status: Home / Self Care | Payer: Medicare PPO | Attending: Family Medicine | Admitting: Family Medicine

## 2020-02-14 ENCOUNTER — Other Ambulatory Visit: Payer: Self-pay

## 2020-02-14 ENCOUNTER — Emergency Department (HOSPITAL_COMMUNITY): Payer: Medicare PPO

## 2020-02-14 DIAGNOSIS — I2510 Atherosclerotic heart disease of native coronary artery without angina pectoris: Secondary | ICD-10-CM | POA: Diagnosis present

## 2020-02-14 DIAGNOSIS — Z86718 Personal history of other venous thrombosis and embolism: Secondary | ICD-10-CM | POA: Diagnosis not present

## 2020-02-14 DIAGNOSIS — C7931 Secondary malignant neoplasm of brain: Secondary | ICD-10-CM | POA: Diagnosis present

## 2020-02-14 DIAGNOSIS — E8770 Fluid overload, unspecified: Secondary | ICD-10-CM

## 2020-02-14 DIAGNOSIS — I4891 Unspecified atrial fibrillation: Secondary | ICD-10-CM | POA: Diagnosis present

## 2020-02-14 DIAGNOSIS — C779 Secondary and unspecified malignant neoplasm of lymph node, unspecified: Secondary | ICD-10-CM | POA: Diagnosis present

## 2020-02-14 DIAGNOSIS — D638 Anemia in other chronic diseases classified elsewhere: Secondary | ICD-10-CM | POA: Diagnosis present

## 2020-02-14 DIAGNOSIS — S72401A Unspecified fracture of lower end of right femur, initial encounter for closed fracture: Secondary | ICD-10-CM | POA: Diagnosis not present

## 2020-02-14 DIAGNOSIS — J90 Pleural effusion, not elsewhere classified: Secondary | ICD-10-CM | POA: Diagnosis not present

## 2020-02-14 DIAGNOSIS — C7800 Secondary malignant neoplasm of unspecified lung: Secondary | ICD-10-CM | POA: Diagnosis present

## 2020-02-14 DIAGNOSIS — I509 Heart failure, unspecified: Secondary | ICD-10-CM

## 2020-02-14 DIAGNOSIS — I472 Ventricular tachycardia: Secondary | ICD-10-CM | POA: Diagnosis not present

## 2020-02-14 DIAGNOSIS — R0989 Other specified symptoms and signs involving the circulatory and respiratory systems: Secondary | ICD-10-CM

## 2020-02-14 DIAGNOSIS — B961 Klebsiella pneumoniae [K. pneumoniae] as the cause of diseases classified elsewhere: Secondary | ICD-10-CM | POA: Diagnosis present

## 2020-02-14 DIAGNOSIS — R0902 Hypoxemia: Secondary | ICD-10-CM | POA: Diagnosis present

## 2020-02-14 DIAGNOSIS — R778 Other specified abnormalities of plasma proteins: Secondary | ICD-10-CM | POA: Diagnosis not present

## 2020-02-14 DIAGNOSIS — C7801 Secondary malignant neoplasm of right lung: Secondary | ICD-10-CM | POA: Diagnosis not present

## 2020-02-14 DIAGNOSIS — I517 Cardiomegaly: Secondary | ICD-10-CM | POA: Diagnosis not present

## 2020-02-14 DIAGNOSIS — R0602 Shortness of breath: Secondary | ICD-10-CM | POA: Diagnosis not present

## 2020-02-14 DIAGNOSIS — E876 Hypokalemia: Secondary | ICD-10-CM | POA: Diagnosis present

## 2020-02-14 DIAGNOSIS — I447 Left bundle-branch block, unspecified: Secondary | ICD-10-CM | POA: Diagnosis not present

## 2020-02-14 DIAGNOSIS — C3490 Malignant neoplasm of unspecified part of unspecified bronchus or lung: Secondary | ICD-10-CM | POA: Diagnosis not present

## 2020-02-14 DIAGNOSIS — B9689 Other specified bacterial agents as the cause of diseases classified elsewhere: Secondary | ICD-10-CM | POA: Diagnosis present

## 2020-02-14 DIAGNOSIS — Z85118 Personal history of other malignant neoplasm of bronchus and lung: Secondary | ICD-10-CM | POA: Diagnosis not present

## 2020-02-14 DIAGNOSIS — Z8619 Personal history of other infectious and parasitic diseases: Secondary | ICD-10-CM

## 2020-02-14 DIAGNOSIS — M1990 Unspecified osteoarthritis, unspecified site: Secondary | ICD-10-CM | POA: Diagnosis present

## 2020-02-14 DIAGNOSIS — R7989 Other specified abnormal findings of blood chemistry: Secondary | ICD-10-CM

## 2020-02-14 DIAGNOSIS — E890 Postprocedural hypothyroidism: Secondary | ICD-10-CM | POA: Diagnosis present

## 2020-02-14 DIAGNOSIS — I110 Hypertensive heart disease with heart failure: Secondary | ICD-10-CM | POA: Diagnosis not present

## 2020-02-14 DIAGNOSIS — R279 Unspecified lack of coordination: Secondary | ICD-10-CM | POA: Diagnosis not present

## 2020-02-14 DIAGNOSIS — I427 Cardiomyopathy due to drug and external agent: Secondary | ICD-10-CM | POA: Diagnosis present

## 2020-02-14 DIAGNOSIS — R52 Pain, unspecified: Secondary | ICD-10-CM | POA: Diagnosis not present

## 2020-02-14 DIAGNOSIS — Z8249 Family history of ischemic heart disease and other diseases of the circulatory system: Secondary | ICD-10-CM

## 2020-02-14 DIAGNOSIS — Z923 Personal history of irradiation: Secondary | ICD-10-CM | POA: Diagnosis not present

## 2020-02-14 DIAGNOSIS — C719 Malignant neoplasm of brain, unspecified: Secondary | ICD-10-CM | POA: Diagnosis present

## 2020-02-14 DIAGNOSIS — R29810 Facial weakness: Secondary | ICD-10-CM | POA: Diagnosis not present

## 2020-02-14 DIAGNOSIS — I083 Combined rheumatic disorders of mitral, aortic and tricuspid valves: Secondary | ICD-10-CM | POA: Diagnosis present

## 2020-02-14 DIAGNOSIS — I959 Hypotension, unspecified: Secondary | ICD-10-CM | POA: Diagnosis present

## 2020-02-14 DIAGNOSIS — J9601 Acute respiratory failure with hypoxia: Secondary | ICD-10-CM | POA: Diagnosis not present

## 2020-02-14 DIAGNOSIS — R9431 Abnormal electrocardiogram [ECG] [EKG]: Secondary | ICD-10-CM | POA: Diagnosis not present

## 2020-02-14 DIAGNOSIS — I429 Cardiomyopathy, unspecified: Secondary | ICD-10-CM | POA: Diagnosis not present

## 2020-02-14 DIAGNOSIS — E785 Hyperlipidemia, unspecified: Secondary | ICD-10-CM | POA: Diagnosis present

## 2020-02-14 DIAGNOSIS — Z85850 Personal history of malignant neoplasm of thyroid: Secondary | ICD-10-CM

## 2020-02-14 DIAGNOSIS — Z7401 Bed confinement status: Secondary | ICD-10-CM

## 2020-02-14 DIAGNOSIS — Z79899 Other long term (current) drug therapy: Secondary | ICD-10-CM

## 2020-02-14 DIAGNOSIS — M810 Age-related osteoporosis without current pathological fracture: Secondary | ICD-10-CM | POA: Diagnosis present

## 2020-02-14 DIAGNOSIS — I420 Dilated cardiomyopathy: Secondary | ICD-10-CM

## 2020-02-14 DIAGNOSIS — F0390 Unspecified dementia without behavioral disturbance: Secondary | ICD-10-CM | POA: Diagnosis present

## 2020-02-14 DIAGNOSIS — L819 Disorder of pigmentation, unspecified: Secondary | ICD-10-CM | POA: Diagnosis present

## 2020-02-14 DIAGNOSIS — E559 Vitamin D deficiency, unspecified: Secondary | ICD-10-CM | POA: Diagnosis present

## 2020-02-14 DIAGNOSIS — I5041 Acute combined systolic (congestive) and diastolic (congestive) heart failure: Secondary | ICD-10-CM | POA: Diagnosis not present

## 2020-02-14 DIAGNOSIS — N390 Urinary tract infection, site not specified: Secondary | ICD-10-CM | POA: Diagnosis not present

## 2020-02-14 DIAGNOSIS — Z902 Acquired absence of lung [part of]: Secondary | ICD-10-CM | POA: Diagnosis not present

## 2020-02-14 DIAGNOSIS — I428 Other cardiomyopathies: Secondary | ICD-10-CM | POA: Diagnosis not present

## 2020-02-14 DIAGNOSIS — Z743 Need for continuous supervision: Secondary | ICD-10-CM | POA: Diagnosis not present

## 2020-02-14 DIAGNOSIS — Z20822 Contact with and (suspected) exposure to covid-19: Secondary | ICD-10-CM | POA: Diagnosis present

## 2020-02-14 DIAGNOSIS — I471 Supraventricular tachycardia: Secondary | ICD-10-CM | POA: Diagnosis not present

## 2020-02-14 DIAGNOSIS — Z7901 Long term (current) use of anticoagulants: Secondary | ICD-10-CM

## 2020-02-14 DIAGNOSIS — Z881 Allergy status to other antibiotic agents status: Secondary | ICD-10-CM

## 2020-02-14 DIAGNOSIS — T451X5A Adverse effect of antineoplastic and immunosuppressive drugs, initial encounter: Secondary | ICD-10-CM | POA: Diagnosis present

## 2020-02-14 DIAGNOSIS — J9811 Atelectasis: Secondary | ICD-10-CM | POA: Diagnosis not present

## 2020-02-14 DIAGNOSIS — F419 Anxiety disorder, unspecified: Secondary | ICD-10-CM | POA: Diagnosis present

## 2020-02-14 DIAGNOSIS — R000 Tachycardia, unspecified: Secondary | ICD-10-CM | POA: Diagnosis not present

## 2020-02-14 LAB — COMPREHENSIVE METABOLIC PANEL
ALT: 10 U/L (ref 0–44)
AST: 17 U/L (ref 15–41)
Albumin: 3.2 g/dL — ABNORMAL LOW (ref 3.5–5.0)
Alkaline Phosphatase: 55 U/L (ref 38–126)
Anion gap: 8 (ref 5–15)
BUN: 11 mg/dL (ref 8–23)
CO2: 25 mmol/L (ref 22–32)
Calcium: 8.7 mg/dL — ABNORMAL LOW (ref 8.9–10.3)
Chloride: 107 mmol/L (ref 98–111)
Creatinine, Ser: 0.68 mg/dL (ref 0.44–1.00)
GFR calc Af Amer: >60 mL/min (ref >60)
GFR calc non Af Amer: >60 mL/min (ref >60)
Glucose, Bld: 110 mg/dL — ABNORMAL HIGH (ref 70–99)
Potassium: 3.3 mmol/L — ABNORMAL LOW (ref 3.5–5.1)
Sodium: 140 mmol/L (ref 135–145)
Total Bilirubin: 0.9 mg/dL (ref 0.3–1.2)
Total Protein: 5.9 g/dL — ABNORMAL LOW (ref 6.5–8.1)

## 2020-02-14 LAB — CBC WITH DIFFERENTIAL/PLATELET
Abs Immature Granulocytes: 0.03 10*3/uL (ref 0.00–0.07)
Basophils Absolute: 0 10*3/uL (ref 0.0–0.1)
Basophils Relative: 0 %
Eosinophils Absolute: 0 10*3/uL (ref 0.0–0.5)
Eosinophils Relative: 0 %
HCT: 33.6 % — ABNORMAL LOW (ref 36.0–46.0)
Hemoglobin: 10.3 g/dL — ABNORMAL LOW (ref 12.0–15.0)
Immature Granulocytes: 0 %
Lymphocytes Relative: 8 %
Lymphs Abs: 0.6 10*3/uL — ABNORMAL LOW (ref 0.7–4.0)
MCH: 28.5 pg (ref 26.0–34.0)
MCHC: 30.7 g/dL (ref 30.0–36.0)
MCV: 93.1 fL (ref 80.0–100.0)
Monocytes Absolute: 0.6 10*3/uL (ref 0.1–1.0)
Monocytes Relative: 7 %
Neutro Abs: 7 10*3/uL (ref 1.7–7.7)
Neutrophils Relative %: 85 %
Platelets: 208 10*3/uL (ref 150–400)
RBC: 3.61 MIL/uL — ABNORMAL LOW (ref 3.87–5.11)
RDW: 15.5 % (ref 11.5–15.5)
WBC: 8.3 10*3/uL (ref 4.0–10.5)
nRBC: 0 % (ref 0.0–0.2)

## 2020-02-14 LAB — TROPONIN I (HIGH SENSITIVITY): Troponin I (High Sensitivity): 111 ng/L (ref <18)

## 2020-02-14 LAB — BRAIN NATRIURETIC PEPTIDE: B Natriuretic Peptide: 985.5 pg/mL — ABNORMAL HIGH (ref 0.0–100.0)

## 2020-02-14 LAB — SARS CORONAVIRUS 2 BY RT PCR (HOSPITAL ORDER, PERFORMED IN ~~LOC~~ HOSPITAL LAB): SARS Coronavirus 2: NEGATIVE

## 2020-02-14 LAB — MAGNESIUM: Magnesium: 1.9 mg/dL (ref 1.7–2.4)

## 2020-02-14 LAB — LIPASE, BLOOD: Lipase: 25 U/L (ref 11–51)

## 2020-02-14 MED ORDER — POTASSIUM CHLORIDE CRYS ER 20 MEQ PO TBCR
40.0000 meq | EXTENDED_RELEASE_TABLET | Freq: Once | ORAL | Status: AC
  Administered 2020-02-14: 40 meq via ORAL
  Filled 2020-02-14: qty 2

## 2020-02-14 MED ORDER — MAGNESIUM SULFATE 2 GM/50ML IV SOLN
2.0000 g | Freq: Once | INTRAVENOUS | Status: AC
  Administered 2020-02-14: 2 g via INTRAVENOUS
  Filled 2020-02-14: qty 50

## 2020-02-14 MED ORDER — ONDANSETRON HCL 4 MG/2ML IJ SOLN: 4.0000 mg | Freq: Once | INTRAMUSCULAR | Status: DC

## 2020-02-14 MED ORDER — FUROSEMIDE 10 MG/ML IJ SOLN
40.0000 mg | Freq: Once | INTRAMUSCULAR | Status: AC
  Administered 2020-02-14: 40 mg via INTRAVENOUS
  Filled 2020-02-14: qty 4

## 2020-02-14 NOTE — ED Triage Notes (Addendum)
[facility], Lung ca patient, 3 days worsening fatigue, shobr, nausea. Patient stated similar feeling to last time she had pneumonia. Patient takes oral chemo daily. o2 sat on RA as reported by EMS 87% - patient doesn't generally require o2 @ home. Patient presents to ED with 2L [place] and o2 sats 99%. Patient baseline is bedbound due to leg injury sustained in October 2020. Patient has had LLLobectomy in past. VS as reported by EMS - hr 110 NSR, 99% 2LNC, rr18, Bp 142/78, CBG 141, temp 97.4. 18 G Left Forearm.   Patient received 4mg  zofran en route. Patient states this did not help much, she still feels nauseated.

## 2020-02-14 NOTE — H&P (Signed)
History and Physical   [facility] [place] [facility] Admission History and Physical [employer], D.O.    Patient Name: Valerie [name] MR#: [id number] Date of Birth: [birth year] Date of Admission: 02/14/2020  Referring MD/NP/PA: Dr. Roslynn Amble Primary Care Physician: [name], MD  Chief Complaint: No chief complaint on file.   HPI: Valerie [name] is a 74 y.o. female with a known history of metastatic non-small cell lung cancer not on home O2 presents to the emergency department for evaluation of SOB.  Patient was in a usual state of health which is bedbound since October 2020 until 3 days ago ago when she noticed progressively worsening shortness of breath, dyspnea on exertion affecting her activities of daily living.  She did have some episodes of intermittent nausea which is not unusual for her but she denies any chest pain, palpitations, dizziness or lightheadedness.    Of note she has been treating her cancer for 17 years the last 10 of which have been with oral chemotherapy.  Patient denies fevers/chills, weakness, dizziness, chest pain, vomiting, diarrhea, abdominal pain, dysuria/frequency, changes in mental status.    Otherwise there has been no change in status. Patient has been taking medication as prescribed and there has been no recent change in medication or diet.  No recent antibiotics.  There has been no recent illness, hospitalizations, travel or sick contacts.    EMS/ED Course: Patient received Lasix, magnesium, KCl. Medical admission has been requested for further management of new onset of CHF. [name]  Review of Systems:  CONSTITUTIONAL: No fever/chills, fatigue, weakness, weight gain/loss, headache. EYES: No blurry or double vision. ENT: No tinnitus, postnasal drip, redness or soreness of the oropharynx. RESPIRATORY: Positive dyspnea. no cough, wheeze.  No hemoptysis.  CARDIOVASCULAR: No chest pain, palpitations, syncope, orthopnea. No lower  extremity edema.  GASTROINTESTINAL: Positive nausea, negative vomiting, abdominal pain, diarrhea, constipation.  No hematemesis, melena or hematochezia. GENITOURINARY: No dysuria, frequency, hematuria. ENDOCRINE: No polyuria or nocturia. No heat or cold intolerance. HEMATOLOGY: No anemia, bruising, bleeding. INTEGUMENTARY: No rashes, ulcers, lesions. MUSCULOSKELETAL: No arthritis, gout. NEUROLOGIC: No numbness, tingling, ataxia, seizure-type activity, weakness. PSYCHIATRIC: No anxiety, depression, insomnia.   Past Medical History:  Diagnosis Date  . Abnormal Pap smear 2006  . Anal fistula   . Ankle fracture   . Anxiety   . Arthritis   . Atrophic vaginitis 2008  . Cataract   . Dementia ([place]) 2009  . Dyspareunia 2008  . H/O osteoporosis   . H/O varicella   . H/O vitamin D deficiency   . Headache 07/24/2016  . Heart murmur   . History of measles, mumps, or rubella   . History of radiation therapy 07/28/13- 08/10/13   right lung metastasis 5000 cGy 10 sessions  . Hypertension   . Lung cancer ([place]) dx'd 2002  . Lung cancer ([place])   . Lung cancer ([place])   . Lung metastasis ([place])    PET scan 05/05/13, RUL lung nodule  . 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 Nodule of right lung CT- 06/03/12   RIGHT UPPER LOBE  . Nodule of right lung 06/03/12   Upper Lobe  . On antineoplastic chemotherapy    TARCEVA  . Osteoporosis 2010  . Primary cancer of right upper lobe of lung ([place]) 04/22/2009   Qualifier: Diagnosis of  By: Nils Pyle CMA ([place]), Mearl Latin    . Shortness of breath    hx lung ca   .  Status post chemotherapy 2003   CARBOPLATIN/PACLITAXEL /STATUS POST CLINICAL TRAIL OF CELEBREX AND IRESSA AT [facility] FOR 1 YEAR  . Status post radiation therapy 2003   LEFT LUNG  . Status post radiation therapy 11/07/2005   WHOLE BRAIN: DR Larkin [facility]  . Status post radiation therapy 06/02/2008   GAMMA KNIFE OF RESECTED CAVITAY  . Thyroid adenoma    ?   [name] Thyroid cancer ([place]) 10/18/11 bx   adenoid nodules   . Yeast infection     Past Surgical History:  Procedure Laterality Date  . BRAIN SURGERY    . LEFT LOWER LOBECTOMY  05/2001  . LEFT OCCIPITAL CRANIOTOMY  09/21/2005   tumor  . LUNG LOBECTOMY    . NECK SURGERY    . ORIF ANKLE FRACTURE Left 10/02/2014   Procedure: OPEN REDUCTION INTERNAL FIXATION (ORIF) ANKLE FRACTURE;  Surgeon: Alta Corning, MD;  Location: [facility];  Service: Orthopedics;  Laterality: Left;  . ORIF FEMUR FRACTURE Right 06/11/2019   Procedure: OPEN REDUCTION INTERNAL FIXATION (ORIF) DISTAL FEMUR FRACTURE;  Surgeon: Renette Butters, MD;  Location: [place];  Service: Orthopedics;  Laterality: Right;  . ORIF TIBIA PLATEAU Right 06/11/2019   Procedure: Open Reduction Internal Fixation (Orif) Tibial Plateau;  Surgeon: Renette Butters, MD;  Location: [place];  Service: Orthopedics;  Laterality: Right;  . RADICAL NECK DISSECTION  10/18/2011   Procedure: RADICAL NECK DISSECTION;  Surgeon: Izora Gala, MD;  Location: [facility];  Service: ENT;  Laterality: Right;  RIGHT MODIFIED NECK DISSECTION /POSSIBLE RIGHT THYROIDECTOM  . THYROIDECTOMY  10/18/2011   Procedure: THYROIDECTOMY WITH RADICAL NECK DISSECTION;  Surgeon: Izora Gala, MD;  Location: [facility];  Service: ENT;  Laterality: Right;     reports that she has never smoked. She has never used smokeless tobacco. She reports that she does not drink alcohol and does not use drugs.  Allergies  Allergen Reactions  . Keflex [Cephalexin]     Family History  Problem Relation Age of Onset  . Gait disorder Mother   . Cancer Mother        Meningioma  . Cancer Father        Pancreatic  . Heart failure Sister   . Cancer Maternal Aunt        menigeoma    Prior to Admission medications   Medication Sig Start Date End Date Taking? Authorizing Provider  Ascorbic Acid (VITAMIN C) 100 MG tablet Take 100 mg by mouth every evening. Gummie   Yes [provider]  cholecalciferol  (VITAMIN D) 1000 UNITS tablet Take 1,000 Units by mouth every evening.    Yes [provider]  diphenoxylate-atropine (LOMOTIL) 2.5-0.025 MG tablet TAKE 1 TABLET BY MOUTH THREE TIMES DAILY AS NEEDED FOR DIARRHEA OR LOOSE STOOLS Patient taking differently: Take 1 tablet by mouth 4 (four) times daily as needed for diarrhea or loose stools.  11/19/19  Yes [name], MD  donepezil (ARICEPT) 10 MG tablet Take 1 tablet (10 mg total) by mouth every evening. 12/15/19  Yes [name], MD  Ibuprofen (ADVIL) 200 MG CAPS Take 1 capsule by mouth daily as needed (pain). Prn    Yes [provider]  loperamide (IMODIUM) 2 MG capsule Take 2 mg by mouth as needed for diarrhea or loose stools.    Yes [provider]  Multiple Vitamin (MULTI-VITAMINS) TABS Take 1 tablet by mouth every evening.    Yes [provider]  polyethylene glycol (MIRALAX / GLYCOLAX) 17 g packet  Take 17 g by mouth daily. Patient taking differently: Take 17 g by mouth daily as needed for moderate constipation.  09/19/19  Yes [place], Waneta Martins, MD  TAGRISSO 80 MG tablet TAKE 1 TABLET (80MG ) BY MOUTH DAILY. Patient taking differently: Take 80 mg by mouth daily.  11/09/19  Yes Curt Bears, MD  vitamin B-12 (CYANOCOBALAMIN) 1000 MCG tablet Take 1,000 mcg by mouth every evening.   Yes [provider]  XARELTO 20 MG TABS tablet Take 1 tablet (20 mg total) by mouth every evening. 12/15/19  Yes [name], MD  MAGNESIUM PO Take 1 tablet by mouth daily. Patient not taking: Reported on 02/14/2020    [provider]  metroNIDAZOLE (FLAGYL) 500 MG tablet Take 1 tablet (500 mg total) by mouth 2 (two) times daily. Patient not taking: Reported on 02/14/2020 11/23/19   Corena Herter, PA-C  nitrofurantoin, macrocrystal-monohydrate, (MACROBID) 100 MG capsule Take 1 capsule (100 mg total) by mouth 2 (two) times daily. Patient not taking: Reported on 02/14/2020 11/10/19   [name], MD     Physical Exam: Vitals:   02/14/20 1705 02/14/20 1847 02/14/20 2000 02/14/20 2030  BP: 111/60 116/78 123/69 126/72  Pulse: (!) 102 95 94 (!) 102  Resp: (!) 24 (!) 25 (!) 24 (!) 25  Temp: 98 F (36.7 C)     TempSrc: Oral     SpO2: 92% 100% 99% 98%  Weight:      Height:        GENERAL: 74 y.o.-year-old female patient, chronically ill-appearing, no acute distress.  Sitting up in bed.  Pleasant and cooperative HEENT: Head atraumatic, normocephalic. Pupils equal. Mucus membranes moist.  Significant alopecia NECK: Supple. No JVD. CHEST: Decreased breath sounds.  No use of accessory muscles of respiration.  No reproducible chest wall tenderness.  CARDIOVASCULAR: S1, S2 normal. No murmurs, rubs, or gallops. Cap refill <2 seconds. Pulses intact distally.  ABDOMEN: Soft, nondistended, nontender. No rebound, guarding, rigidity. Normoactive bowel sounds present in all four quadrants.  EXTREMITIES: No pedal edema, cyanosis, or clubbing. No calf tenderness or Homan's sign.  NEUROLOGIC: The patient is alert and oriented x 3. Cranial nerves II through XII are grossly intact with no focal sensorimotor deficit. PSYCHIATRIC:  Normal affect, mood, thought content. SKIN: Warm, dry, and intact without obvious rash, lesion, or ulcer.    Labs on Admission:  CBC: Recent Labs  Lab 02/14/20 1746  WBC 8.3  NEUTROABS 7.0  HGB 10.3*  HCT 33.6*  MCV 93.1  PLT 702   Basic Metabolic Panel: Recent Labs  Lab 02/14/20 1746 02/14/20 1837  NA  --  140  K  --  3.3*  CL  --  107  CO2  --  25  GLUCOSE  --  110*  BUN  --  11  CREATININE  --  0.68  CALCIUM  --  8.7*  MG 1.9  --    GFR: Estimated Creatinine Clearance: 60 mL/min (by C-G formula based on SCr of 0.68 mg/dL). Liver Function Tests: Recent Labs  Lab 02/14/20 1837  AST 17  ALT 10  ALKPHOS 55  BILITOT 0.9  PROT 5.9*  ALBUMIN 3.2*   Recent Labs  Lab 02/14/20 1837  LIPASE 25   No results for input(s): AMMONIA in the last 168  hours. Coagulation Profile: No results for input(s): INR, PROTIME in the last 168 hours. Cardiac Enzymes: No results for input(s): CKTOTAL, CKMB, CKMBINDEX, TROPONINI in the last 168 hours. BNP (last 3 results) No  results for input(s): PROBNP in the last 8760 hours. HbA1C: No results for input(s): HGBA1C in the last 72 hours. CBG: No results for input(s): GLUCAP in the last 168 hours. Lipid Profile: No results for input(s): CHOL, HDL, LDLCALC, TRIG, CHOLHDL, LDLDIRECT in the last 72 hours. Thyroid Function Tests: No results for input(s): TSH, T4TOTAL, FREET4, T3FREE, THYROIDAB in the last 72 hours. Anemia Panel: No results for input(s): VITAMINB12, FOLATE, FERRITIN, TIBC, IRON, RETICCTPCT in the last 72 hours. Urine analysis:    Component Value Date/Time   COLORURINE YELLOW 11/23/2019 2318   APPEARANCEUR TURBID (A) 11/23/2019 2318   LABSPEC 1.028 11/23/2019 2318   PHURINE 5.0 11/23/2019 2318   GLUCOSEU NEGATIVE 11/23/2019 2318   HGBUR MODERATE (A) 11/23/2019 2318   BILIRUBINUR NEGATIVE 11/23/2019 2318   BILIRUBINUR negative 05/25/2019 1653   KETONESUR NEGATIVE 11/23/2019 2318   PROTEINUR 100 (A) 11/23/2019 2318   UROBILINOGEN 0.2 05/25/2019 1653   UROBILINOGEN 0.2 10/20/2014 1636   NITRITE POSITIVE (A) 11/23/2019 2318   LEUKOCYTESUR LARGE (A) 11/23/2019 2318   Sepsis Labs: @LABRCNTIP (procalcitonin:4,lacticidven:4) ) Recent Results (from the past 240 hour(s))  SARS Coronavirus 2 by RT PCR (hospital order, performed in [facility] hospital lab) Nasopharyngeal Nasopharyngeal Swab     Status: None   Collection Time: 02/14/20  5:46 PM   Specimen: Nasopharyngeal Swab  Result Value Ref Range Status   SARS Coronavirus 2 NEGATIVE NEGATIVE Final    Comment: (NOTE) SARS-CoV-2 target nucleic acids are NOT DETECTED.  The SARS-CoV-2 RNA is generally detectable in upper and lower respiratory specimens during the acute phase of infection. The lowest concentration of SARS-CoV-2 viral  copies this assay can detect is 250 copies / mL. A negative result does not preclude SARS-CoV-2 infection and should not be used as the sole basis for treatment or other patient management decisions.  A negative result may occur with improper specimen collection / handling, submission of specimen other than nasopharyngeal swab, presence of viral mutation(s) within the areas targeted by this assay, and inadequate number of viral copies (<250 copies / mL). A negative result must be combined with clinical observations, patient history, and epidemiological information.  Fact Sheet for Patients:   StrictlyIdeas.no  Fact Sheet for Healthcare Providers: BankingDealers.co.za  This test is not yet approved or  cleared by the [place] FDA and has been authorized for detection and/or diagnosis of SARS-CoV-2 by FDA under an Emergency Use Authorization (EUA).  This EUA will remain in effect (meaning this test can be used) for the duration of the COVID-19 declaration under Section 564(b)(1) of the Act, 21 U.S.C. section 360bbb-3(b)(1), unless the authorization is terminated or revoked sooner.  Performed at [facility], [place] [address], [place], [place] [address]      Radiological Exams on Admission: DG Chest 2 View  Result Date: 02/14/2020 CLINICAL DATA:  Shortness of breath.  Lung cancer. EXAM: CHEST - 2 VIEW COMPARISON:  06/16/2019 FINDINGS: Cardiomegaly, vascular congestion. Small left pleural effusion with left lower lobe atelectasis or infiltrate. Postoperative changes on the left. No acute bony abnormality. IMPRESSION: Cardiomegaly with vascular congestion. Small left pleural effusion with left base atelectasis or infiltrate. Electronically Signed   By: Rolm Baptise M.D.   On: 02/14/2020 17:49    EKG: Atrial fibrillation at 93 bpm with normal axis, new left bundle branch block, QTC corrected is 550 and nonspecific ST-T  wave changes.   Assessment/Plan  This is a 74 y.o. female with a history of metastatic non-small cell  cancer of the lung now being admitted with:  #.  New onset of congestive heart failure, new onset atrial fibrillation elevated troponin -Admit inpatient telemetry monitoring. - Beta blocker, ACE, diuretic. - Intake/output, daily weight. - Trend troponins, check lipids and TSH. - Echo - Cardiology consultation requested by EDP -Abnormal EKG with prolonged QT, magnesium given in emergency department -Lovenox at treatment dose -Aspirin given  #.  Mild hypokalemia -Replaced orally in emergency department -Trend BMP  #.  History of non-small cell lung cancer -Continue Tagrisso   Admission status: Inpatient, telemetry IV Fluids: Hep-Lock Diet/Nutrition: Heart healthy Consults called: Cardiology DVT Px: Lovenox and early ambulation. Code Status: Full Code  Disposition Plan: To home in 1-2 days  All the records are reviewed and case discussed with ED provider. Management plans discussed with the patient and/or family who express understanding and agree with plan of care.  Bali Lyn D.O. on 02/14/2020 at 9:32 PM CC: Primary care physician; [name], MD   02/14/2020, 9:32 PM

## 2020-02-14 NOTE — ED Notes (Signed)
Date and time results received: 02/14/20 2000 (use smartphrase ".now" to insert current time)  Test: troponin Critical Value: 111  Name of Provider Notified: Madalyn Rob  Orders Received? Or Actions Taken?: MD aware

## 2020-02-14 NOTE — ED Notes (Signed)
Contacted lab regarding HST that was collected and sent down at 1746. Lab stated order was cancelled. Lab then stated that they didn't receive the tube. Lab then stated that it clotted and they needed another. Recollected at this time and sent to lab. MD notified.

## 2020-02-14 NOTE — ED Provider Notes (Signed)
[facility] DEPT Provider Note   CSN: [id number] Arrival date & time: 02/14/20  1641     History No chief complaint on file.   Valerie [name] is a 74 y.o. female.   metastatic non-small cell lung cancer, adenocarcinoma currently normal.  On recent surveillance CT scans were reassuring.  Patient presents to ER with concern for shortness of breath.  Has noted symptoms since Friday, steadily worse shortness of breath, worse with exertion has had some intermittent nausea, but normally struggles with daily nausea.  No abdominal pain.  No chest pain.  No fevers, no new cough. HPI     Past Medical History:  Diagnosis Date  . Abnormal Pap smear 2006  . Anal fistula   . Ankle fracture   . Anxiety   . Arthritis   . Atrophic vaginitis 2008  . Cataract   . Dementia ([place]) 2009  . Dyspareunia 2008  . H/O osteoporosis   . H/O varicella   . H/O vitamin D deficiency   . Headache 07/24/2016  . Heart murmur   . History of measles, mumps, or rubella   . History of radiation therapy 07/28/13- 08/10/13   right lung metastasis 5000 cGy 10 sessions  . Hypertension   . Lung cancer ([place]) dx'd 2002  . Lung cancer ([place])   . Lung cancer ([place])   . Lung metastasis ([place])    PET scan 05/05/13, RUL lung nodule  . 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 Nodule of right lung CT- 06/03/12   RIGHT UPPER LOBE  . Nodule of right lung 06/03/12   Upper Lobe  . On antineoplastic chemotherapy    TARCEVA  . Osteoporosis 2010  . Primary cancer of right upper lobe of lung ([place]) 04/22/2009   Qualifier: Diagnosis of  By: Nils Pyle CMA ([place]), Mearl Latin    . Shortness of breath    hx lung ca   . Status post chemotherapy 2003   CARBOPLATIN/PACLITAXEL /STATUS POST CLINICAL TRAIL OF CELEBREX AND IRESSA AT [facility] FOR 1 YEAR  . Status post radiation therapy 2003   LEFT LUNG  . Status post radiation therapy 11/07/2005   WHOLE BRAIN: DR Larkin Ina  [facility]  . Status post radiation therapy 06/02/2008   GAMMA KNIFE OF RESECTED CAVITAY  . Thyroid adenoma    ?  [name] Thyroid cancer ([place]) 10/18/11 bx   adenoid nodules   . Yeast infection     Patient Active Problem List   Diagnosis Date Noted  . Congestive heart failure (CHF) ([place]) 02/14/2020  . Pyelonephritis 09/10/2019  . Acute blood loss anemia 06/12/2019  . Closed fracture of distal end of right femur, initial encounter ([place]) 06/11/2019  . Closed fracture of lateral portion of right tibial plateau 06/11/2019  . History of DVT of lower extremity 06/11/2019  . Blurry vision 01/06/2019  . Headache 07/24/2016  . Cough 08/24/2015  . Chronic diarrhea 08/24/2015  . Sepsis ([place])   . Encounter for antineoplastic chemotherapy 01/31/2015  . Urinary retention 10/20/2014  . Ileus ([place]) 10/20/2014  . Cellulitis of leg, right 10/20/2014  . Fracture of right proximal fibula   . Ankle fracture 10/02/2014  . Ankle fracture 10/02/2014  . Lung cancer ([place]) 10/02/2014  . Closed fibular fracture 10/02/2014  . Dementia ([place]) 10/02/2014  . Lower urinary tract infectious disease 10/02/2014  . Reactive airway disease 10/02/2014  . Fall 10/02/2014  . Closed left ankle fracture 10/01/2014  .  Right fibular fracture 10/01/2014  . Lung metastasis ([place])   . LESION, ANUS 04/26/2009  . DIARRHEA 04/26/2009  . Primary cancer of right upper lobe of lung ([place]) 04/22/2009  . Malignant neoplasm of brain ([place]) 04/22/2009  . VITAMIN D DEFICIENCY 04/22/2009  . ANAL FISTULA 04/22/2009  . OSTEOPOROSIS 04/22/2009    Past Surgical History:  Procedure Laterality Date  . BRAIN SURGERY    . LEFT LOWER LOBECTOMY  05/2001  . LEFT OCCIPITAL CRANIOTOMY  09/21/2005   tumor  . LUNG LOBECTOMY    . NECK SURGERY    . ORIF ANKLE FRACTURE Left 10/02/2014   Procedure: OPEN REDUCTION INTERNAL FIXATION (ORIF) ANKLE FRACTURE;  Surgeon: Alta Corning, MD;  Location: [facility];  Service: Orthopedics;  Laterality: Left;  . ORIF FEMUR  FRACTURE Right 06/11/2019   Procedure: OPEN REDUCTION INTERNAL FIXATION (ORIF) DISTAL FEMUR FRACTURE;  Surgeon: Renette Butters, MD;  Location: [facility];  Service: Orthopedics;  Laterality: Right;  . ORIF TIBIA PLATEAU Right 06/11/2019   Procedure: Open Reduction Internal Fixation (Orif) Tibial Plateau;  Surgeon: Renette Butters, MD;  Location: [facility];  Service: Orthopedics;  Laterality: Right;  . RADICAL NECK DISSECTION  10/18/2011   Procedure: RADICAL NECK DISSECTION;  Surgeon: Izora Gala, MD;  Location: [facility];  Service: ENT;  Laterality: Right;  RIGHT MODIFIED NECK DISSECTION /POSSIBLE RIGHT THYROIDECTOM  . THYROIDECTOMY  10/18/2011   Procedure: THYROIDECTOMY WITH RADICAL NECK DISSECTION;  Surgeon: Izora Gala, MD;  Location: [facility];  Service: ENT;  Laterality: Right;     OB History    Gravida  2   Para  0   Term  0   Preterm  0   AB  0   Living  2     SAB  0   TAB  0   Ectopic  0   Multiple      Live Births  2           Family History  Problem Relation Age of Onset  . Gait disorder Mother   . Cancer Mother        Meningioma  . Cancer Father        Pancreatic  . Heart failure Sister   . Cancer Maternal Aunt        menigeoma    Social History   Tobacco Use  . Smoking status: Never Smoker  . Smokeless tobacco: Never Used  . Tobacco comment: smoked few years in college  Vaping Use  . Vaping Use: Never used  Substance Use Topics  . Alcohol use: No  . Drug use: No    Home Medications Prior to Admission medications   Medication Sig Start Date End Date Taking? Authorizing Provider  Ascorbic Acid (VITAMIN C) 100 MG tablet Take 100 mg by mouth every evening. Gummie   Yes [provider]  cholecalciferol (VITAMIN D) 1000 UNITS tablet Take 1,000 Units by mouth every evening.    Yes [provider]  diphenoxylate-atropine (LOMOTIL) 2.5-0.025 MG tablet TAKE 1 TABLET BY MOUTH THREE TIMES DAILY AS NEEDED FOR DIARRHEA OR LOOSE STOOLS Patient  taking differently: Take 1 tablet by mouth 4 (four) times daily as needed for diarrhea or loose stools.  11/19/19  Yes [name], MD  donepezil (ARICEPT) 10 MG tablet Take 1 tablet (10 mg total) by mouth every evening. 12/15/19  Yes [name], MD  Ibuprofen (ADVIL) 200 MG CAPS Take 1 capsule by mouth daily as needed (pain). Prn  Yes [provider]  loperamide (IMODIUM) 2 MG capsule Take 2 mg by mouth as needed for diarrhea or loose stools.    Yes [provider]  Multiple Vitamin (MULTI-VITAMINS) TABS Take 1 tablet by mouth every evening.    Yes [provider]  polyethylene glycol (MIRALAX / GLYCOLAX) 17 g packet Take 17 g by mouth daily. Patient taking differently: Take 17 g by mouth daily as needed for moderate constipation.  09/19/19  Yes [place], Waneta Martins, MD  TAGRISSO 80 MG tablet TAKE 1 TABLET (80MG ) BY MOUTH DAILY. Patient taking differently: Take 80 mg by mouth daily.  11/09/19  Yes Curt Bears, MD  vitamin B-12 (CYANOCOBALAMIN) 1000 MCG tablet Take 1,000 mcg by mouth every evening.   Yes [provider]  XARELTO 20 MG TABS tablet Take 1 tablet (20 mg total) by mouth every evening. 12/15/19  Yes [name], MD  MAGNESIUM PO Take 1 tablet by mouth daily. Patient not taking: Reported on 02/14/2020    [provider]  metroNIDAZOLE (FLAGYL) 500 MG tablet Take 1 tablet (500 mg total) by mouth 2 (two) times daily. Patient not taking: Reported on 02/14/2020 11/23/19   Corena Herter, PA-C  nitrofurantoin, macrocrystal-monohydrate, (MACROBID) 100 MG capsule Take 1 capsule (100 mg total) by mouth 2 (two) times daily. Patient not taking: Reported on 02/14/2020 11/10/19   [name], MD    Allergies    Keflex [cephalexin]  Review of Systems   Review of Systems  Constitutional: Positive for fatigue. Negative for chills and fever.  HENT: Negative for ear pain and sore throat.   Eyes: Negative for pain and visual disturbance.   Respiratory: Positive for shortness of breath. Negative for cough.   Cardiovascular: Negative for chest pain and palpitations.  Gastrointestinal: Positive for nausea. Negative for abdominal pain and vomiting.  Genitourinary: Negative for dysuria and hematuria.  Musculoskeletal: Negative for arthralgias and back pain.  Skin: Negative for color change and rash.  Neurological: Negative for seizures and syncope.  All other systems reviewed and are negative.   Physical Exam Updated Vital Signs BP 117/69   Pulse 97   Temp 98 F (36.7 C) (Oral)   Resp (!) 26   Ht 5\' 7"  (1.702 m)   Wt 73.9 kg   SpO2 92%   BMI 25.53 kg/m   Physical Exam Vitals and nursing note reviewed.  Constitutional:      General: She is not in acute distress.    Appearance: She is well-developed.  HENT:     Head: Normocephalic and atraumatic.  Eyes:     Conjunctiva/sclera: Conjunctivae normal.  Cardiovascular:     Rate and Rhythm: Normal rate and regular rhythm.     Heart sounds: No murmur heard.   Pulmonary:     Comments: Mild tachypnea, somewhat diminished at bases Abdominal:     Palpations: Abdomen is soft.     Tenderness: There is no abdominal tenderness.  Musculoskeletal:     Cervical back: Neck supple.  Skin:    General: Skin is warm and dry.  Neurological:     Mental Status: She is alert.     ED Results / Procedures / Treatments   Labs (all labs ordered are listed, but only abnormal results are displayed) Labs Reviewed  CBC WITH DIFFERENTIAL/PLATELET - Abnormal; Notable for the following components:      Result Value   RBC 3.61 (*)    Hemoglobin 10.3 (*)    HCT 33.6 (*)  Lymphs Abs 0.6 (*)    All other components within normal limits  BRAIN NATRIURETIC PEPTIDE - Abnormal; Notable for the following components:   B Natriuretic Peptide 985.5 (*)    All other components within normal limits  COMPREHENSIVE METABOLIC PANEL - Abnormal; Notable for the following components:   Potassium  3.3 (*)    Glucose, Bld 110 (*)    Calcium 8.7 (*)    Total Protein 5.9 (*)    Albumin 3.2 (*)    All other components within normal limits  TROPONIN I (HIGH SENSITIVITY) - Abnormal; Notable for the following components:   Troponin I (High Sensitivity) 111 (*)    All other components within normal limits  SARS CORONAVIRUS 2 BY RT PCR (HOSPITAL ORDER, [facility])  MAGNESIUM  LIPASE, BLOOD  URINALYSIS, ROUTINE W REFLEX MICROSCOPIC  TROPONIN I (HIGH SENSITIVITY)    EKG EKG Interpretation  Date/Time:  "Sunday February 14 2020 17:07:34 EDT Ventricular Rate:  93 PR Interval:    QRS Duration: 147 QT Interval:  442 QTC Calculation: 550 R Axis:   105 Text Interpretation: Atrial fibrillation Left bundle branch block when compared to prior, LBBB is new Confirmed by ,  ([id number]) on 02/14/2020 5:38:33 PM   Radiology DG Chest 2 View  Result Date: 02/14/2020 CLINICAL DATA:  Shortness of breath.  Lung cancer. EXAM: CHEST - 2 VIEW COMPARISON:  06/16/2019 FINDINGS: Cardiomegaly, vascular congestion. Small left pleural effusion with left lower lobe atelectasis or infiltrate. Postoperative changes on the left. No acute bony abnormality. IMPRESSION: Cardiomegaly with vascular congestion. Small left pleural effusion with left base atelectasis or infiltrate. Electronically Signed   By: Kevin  Dover M.D.   On: 02/14/2020 17:49    Procedures .Critical Care Performed by: ,  S, MD Authorized by: ,  S, MD   Critical care provider statement:    Critical care time (minutes):  45   Critical care was necessary to treat or prevent imminent or life-threatening deterioration of the following conditions:  Respiratory failure   Critical care was time spent personally by me on the following activities:  Discussions with consultants, evaluation of patient's response to treatment, examination of patient, ordering and performing treatments and  interventions, ordering and review of laboratory studies, ordering and review of radiographic studies, pulse oximetry, re-evaluation of patient's condition, obtaining history from patient or surrogate and review of old charts   (including critical care time)  Medications Ordered in ED Medications  magnesium sulfate IVPB 2 g 50 mL (0 g Intravenous Stopped 02/14/20 1915)  furosemide (LASIX) injection 40 mg (40 mg Intravenous Given 02/14/20 2106)  potassium chloride SA (KLOR-CON) CR tablet 40 mEq (40 mEq Oral Given 02/14/20 2107)    ED Course  I have reviewed the triage vital signs and the nursing notes.  Pertinent labs & imaging results that were available during my care of the patient were reviewed by me and considered in my medical decision making (see chart for details).  Clinical Course as of Feb 14 2208  Sun Feb 14, 2020  1733 Completed initial assessment, no chest pain, will touch base with STEMI doc due to EKG changes, paged Berry   [RD]    Clinical Course User Index [RD] ,  S, MD   MDM Rules/Calculators/A&P                         73"  year old lady presented to ER with concern for shortness of breath.  Notable medical history of lung cancer, currently on oral chemo agent, recent CT scan surveillance studies were reassuring.  On exam patient was found to be mildly hypoxic, did well with 2 L nasal cannula.  Had somewhat diminished breath sounds at bases but was not in respiratory distress.  EKG noted new left bundle, reviewed with STEMI doc berry - no STEMI.  Troponin 111, no ongoing chest pain, doubt ACS.  CXR demonstrating vascular congestion, small pleural effusion.  BNP significantly elevated.  Suspect patient symptoms most likely fluid overload, heart failure.  Does not have a prior diagnosis of heart failure but has not ever had echo in our system, CXR shows cardiomegaly.  No change in chronic cough, no fever, no white count, lower suspicion for infectious process. Hx  DVT but pt reports compliance with AC, therefore feel less likely PE. Reviewed with cardiology again after a troponin level came back, talked with Dr. Neena Rhymes, recommends treatment of heart failure, cardiology will see in consultation in the morning but otherwise recommends hospitalist admission.  Updated patient and husband at bedside.  Will start on Lasix, borderline low potassium, provided oral supplementation.  Consulted hospitalist, Dr. Ara Kussmaul will admit.   Final Clinical Impression(s) / ED Diagnoses Final diagnoses:  Pulmonary vascular congestion  Hypervolemia, unspecified hypervolemia type  Elevated troponin  Acute respiratory failure with hypoxia [facility])    Rx / DC Orders ED Discharge Orders    None       Lucrezia Starch, MD 02/14/20 2209

## 2020-02-15 ENCOUNTER — Encounter (HOSPITAL_COMMUNITY): Payer: Self-pay | Admitting: Family Medicine

## 2020-02-15 ENCOUNTER — Inpatient Hospital Stay (HOSPITAL_COMMUNITY): Payer: Medicare PPO

## 2020-02-15 DIAGNOSIS — I5041 Acute combined systolic (congestive) and diastolic (congestive) heart failure: Secondary | ICD-10-CM

## 2020-02-15 DIAGNOSIS — I447 Left bundle-branch block, unspecified: Secondary | ICD-10-CM

## 2020-02-15 DIAGNOSIS — R778 Other specified abnormalities of plasma proteins: Secondary | ICD-10-CM

## 2020-02-15 DIAGNOSIS — J9601 Acute respiratory failure with hypoxia: Secondary | ICD-10-CM

## 2020-02-15 DIAGNOSIS — R9431 Abnormal electrocardiogram [ECG] [EKG]: Secondary | ICD-10-CM

## 2020-02-15 DIAGNOSIS — C719 Malignant neoplasm of brain, unspecified: Secondary | ICD-10-CM

## 2020-02-15 DIAGNOSIS — I429 Cardiomyopathy, unspecified: Secondary | ICD-10-CM

## 2020-02-15 DIAGNOSIS — F0390 Unspecified dementia without behavioral disturbance: Secondary | ICD-10-CM

## 2020-02-15 DIAGNOSIS — C7801 Secondary malignant neoplasm of right lung: Secondary | ICD-10-CM

## 2020-02-15 LAB — LIPID PANEL
Cholesterol: 242 mg/dL — ABNORMAL HIGH (ref 0–200)
HDL: 68 mg/dL (ref >40)
LDL Cholesterol: 155 mg/dL — ABNORMAL HIGH (ref 0–99)
Total CHOL/HDL Ratio: 3.6 RATIO
Triglycerides: 97 mg/dL (ref <150)
VLDL: 19 mg/dL (ref 0–40)

## 2020-02-15 LAB — URINALYSIS, ROUTINE W REFLEX MICROSCOPIC
Bilirubin Urine: NEGATIVE
Glucose, UA: NEGATIVE mg/dL
Ketones, ur: NEGATIVE mg/dL
Nitrite: POSITIVE — AB
Protein, ur: NEGATIVE mg/dL
Specific Gravity, Urine: 1.005 (ref 1.005–1.030)
pH: 5 (ref 5.0–8.0)

## 2020-02-15 LAB — BASIC METABOLIC PANEL
Anion gap: 9 (ref 5–15)
BUN: 11 mg/dL (ref 8–23)
CO2: 27 mmol/L (ref 22–32)
Calcium: 8.8 mg/dL — ABNORMAL LOW (ref 8.9–10.3)
Chloride: 100 mmol/L (ref 98–111)
Creatinine, Ser: 0.85 mg/dL (ref 0.44–1.00)
GFR calc Af Amer: >60 mL/min (ref >60)
GFR calc non Af Amer: >60 mL/min (ref >60)
Glucose, Bld: 107 mg/dL — ABNORMAL HIGH (ref 70–99)
Potassium: 2.9 mmol/L — ABNORMAL LOW (ref 3.5–5.1)
Sodium: 136 mmol/L (ref 135–145)

## 2020-02-15 LAB — ECHOCARDIOGRAM COMPLETE
Height: 67 in
Weight: 2455.04 oz

## 2020-02-15 LAB — TROPONIN I (HIGH SENSITIVITY)
Troponin I (High Sensitivity): 109 ng/L (ref <18)
Troponin I (High Sensitivity): 106 ng/L (ref <18)

## 2020-02-15 LAB — CBC
HCT: 33.7 % — ABNORMAL LOW (ref 36.0–46.0)
Hemoglobin: 10.8 g/dL — ABNORMAL LOW (ref 12.0–15.0)
MCH: 29.4 pg (ref 26.0–34.0)
MCHC: 32 g/dL (ref 30.0–36.0)
MCV: 91.8 fL (ref 80.0–100.0)
Platelets: 224 10*3/uL (ref 150–400)
RBC: 3.67 MIL/uL — ABNORMAL LOW (ref 3.87–5.11)
RDW: 15.3 % (ref 11.5–15.5)
WBC: 6.2 10*3/uL (ref 4.0–10.5)
nRBC: 0 % (ref 0.0–0.2)

## 2020-02-15 LAB — D-DIMER, QUANTITATIVE: D-Dimer, Quant: 0.99 ug/mL-FEU — ABNORMAL HIGH (ref 0.00–0.50)

## 2020-02-15 LAB — TSH: TSH: 2.095 u[IU]/mL (ref 0.350–4.500)

## 2020-02-15 MED ORDER — SODIUM CHLORIDE 0.9% FLUSH
3.0000 mL | Freq: Two times a day (BID) | INTRAVENOUS | Status: DC
  Administered 2020-02-15 – 2020-02-16 (×3): 3 mL via INTRAVENOUS

## 2020-02-15 MED ORDER — POLYETHYLENE GLYCOL 3350 17 G PO PACK: 17.0000 g | PACK | Freq: Every day | ORAL | Status: DC | PRN

## 2020-02-15 MED ORDER — SODIUM CHLORIDE 0.9 % IV SOLN: 250.0000 mL | INTRAVENOUS | Status: DC | PRN

## 2020-02-15 MED ORDER — ASPIRIN 81 MG PO CHEW
81.0000 mg | CHEWABLE_TABLET | ORAL | Status: AC
  Administered 2020-02-16: 81 mg via ORAL
  Filled 2020-02-15: qty 1

## 2020-02-15 MED ORDER — SODIUM CHLORIDE 0.9 % IV SOLN: INTRAVENOUS | Status: DC

## 2020-02-15 MED ORDER — SENNOSIDES-DOCUSATE SODIUM 8.6-50 MG PO TABS: 2.0000 | ORAL_TABLET | Freq: Every evening | ORAL | Status: DC | PRN

## 2020-02-15 MED ORDER — METOPROLOL SUCCINATE ER 25 MG PO TB24
12.5000 mg | ORAL_TABLET | Freq: Every day | ORAL | Status: DC
  Administered 2020-02-15 – 2020-02-17 (×4): 12.5 mg via ORAL
  Filled 2020-02-15 (×4): qty 1

## 2020-02-15 MED ORDER — ACETAMINOPHEN 325 MG PO TABS
650.0000 mg | ORAL_TABLET | ORAL | Status: DC | PRN
  Administered 2020-02-16: 650 mg via ORAL
  Filled 2020-02-15: qty 2

## 2020-02-15 MED ORDER — ASPIRIN EC 81 MG PO TBEC
81.0000 mg | DELAYED_RELEASE_TABLET | Freq: Every day | ORAL | Status: DC
  Administered 2020-02-15 – 2020-02-17 (×4): 81 mg via ORAL
  Filled 2020-02-15 (×3): qty 1

## 2020-02-15 MED ORDER — ONDANSETRON HCL 4 MG/2ML IJ SOLN: 4.0000 mg | Freq: Four times a day (QID) | INTRAMUSCULAR | Status: DC | PRN

## 2020-02-15 MED ORDER — FUROSEMIDE 10 MG/ML IJ SOLN: 20.0000 mg | Freq: Two times a day (BID) | INTRAMUSCULAR | Status: DC

## 2020-02-15 MED ORDER — SODIUM CHLORIDE 0.9% FLUSH: 3.0000 mL | INTRAVENOUS | Status: DC | PRN

## 2020-02-15 MED ORDER — IPRATROPIUM-ALBUTEROL 0.5-2.5 (3) MG/3ML IN SOLN: 3.0000 mL | RESPIRATORY_TRACT | Status: DC | PRN

## 2020-02-15 MED ORDER — FUROSEMIDE 10 MG/ML IJ SOLN
40.0000 mg | Freq: Two times a day (BID) | INTRAMUSCULAR | Status: DC
  Administered 2020-02-15 – 2020-02-17 (×4): 40 mg via INTRAVENOUS
  Filled 2020-02-15 (×4): qty 4

## 2020-02-15 MED ORDER — POTASSIUM CHLORIDE CRYS ER 20 MEQ PO TBCR
40.0000 meq | EXTENDED_RELEASE_TABLET | ORAL | Status: AC
  Administered 2020-02-15 (×3): 40 meq via ORAL
  Filled 2020-02-15 (×3): qty 2

## 2020-02-15 MED ORDER — SODIUM CHLORIDE 0.9 % IV SOLN
1.0000 g | INTRAVENOUS | Status: DC
  Administered 2020-02-15 – 2020-02-17 (×2): 1 g via INTRAVENOUS
  Filled 2020-02-15: qty 10
  Filled 2020-02-15: qty 1
  Filled 2020-02-15: qty 10

## 2020-02-15 MED ORDER — ATORVASTATIN CALCIUM 40 MG PO TABS: 40.0000 mg | ORAL_TABLET | Freq: Every day | ORAL | Status: DC

## 2020-02-15 MED ORDER — LISINOPRIL 5 MG PO TABS: 5.0000 mg | ORAL_TABLET | Freq: Every day | ORAL | Status: DC

## 2020-02-15 MED ORDER — ATORVASTATIN CALCIUM 40 MG PO TABS
80.0000 mg | ORAL_TABLET | Freq: Every day | ORAL | Status: DC
  Administered 2020-02-15 – 2020-02-17 (×3): 80 mg via ORAL
  Filled 2020-02-15 (×3): qty 2

## 2020-02-15 MED ORDER — ENOXAPARIN SODIUM 80 MG/0.8ML ~~LOC~~ SOLN
1.0000 mg/kg | Freq: Two times a day (BID) | SUBCUTANEOUS | Status: AC
  Administered 2020-02-15 (×2): 70 mg via SUBCUTANEOUS
  Filled 2020-02-15 (×2): qty 0.8

## 2020-02-15 NOTE — Progress Notes (Signed)
  Echocardiogram 2D Echocardiogram has been performed.  Valerie [name] 02/15/2020, 8:46 AM

## 2020-02-15 NOTE — Consult Note (Signed)
Cardiology Consultation:   Patient ID: Valerie [name]; [id number]; 03/12/1946   Admit date: 02/14/2020 Date of Consult: 02/15/2020  Primary Care Provider: [name], MD Primary Cardiologist: New to [place]; Valerie [name] Primary Electrophysiologist:  None   Patient Profile:   Valerie [name] is a 74 y.o. female with a PMH of metastatic NSCLC (diagnosed 42 yr ago, on oral chemo x10 years), history of DVT on anticoagulation, bedbound since 05/2019, who is being seen today for the evaluation of SOB and new onset atrial fibrillation at the request of Valerie [name].  History of Present Illness:   Valerie [name] was in her usual state of health until 3 days ago when she began experiencing progressive SOB and DOE affecting her daily activities. She recalls some intermittent nausea as well which is not unusual for her. No complaints of chest pain, palpitations, dizziness, lightheadedness, or syncope.   She has no prior cardiac history. She reports her sister has a PPM, though no known family history of of CAD/MI or CHF. She has never smoked and denies ETOH/ilicit drug use.   At the time of this evaluation she reports improvement in her SOB. She reported onset of SOB was a few days ago. She is bed bound since a leg fracture 05/2019 so activity is quite limited. She reported SOB was worse when laying down and improved with sitting up. She has had PND for the past several days. No complaints of chest pain or LE edema. She reports her husband frequently picks up take-out from restaurants. She reports compliance with her xarelto. 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 Labs notable for K 3.3>2.9, Mg 1.9, Cr 0.6>0.8, Hgb 10.8, PLT 224, TSh wnl, DDimer 0.99, HsTrop 111, BNP 985, LDL 155. EKG with sinus  rhythm with rate 93 bpm, PAC, LBBB - new from 05/2019. CXR showed cardiomegaly with vascular congestion and small left pleural effusion with left base atelectasis vs infiltrate. Echo Is pending. Patient was given IV lasix 40mg  with UOP -1.5L with at least 1 unmeasured urine output occurrence.   Past Medical History:  Diagnosis Date  . Abnormal Pap smear 2006  . Anal fistula   . Ankle fracture   . Anxiety   . Arthritis   . Atrophic vaginitis 2008  . Cataract   . Dementia ([place]) 2009  . Dyspareunia 2008  . H/O osteoporosis   . H/O varicella   . H/O vitamin D deficiency   . Headache 07/24/2016  . Heart murmur   . History of measles, mumps, or rubella   . History of radiation therapy 07/28/13- 08/10/13   right lung metastasis 5000 cGy 10 sessions  . Hypertension   . Lung cancer ([place]) dx'd 2002  . Lung cancer ([place])   . Lung cancer ([place])   . Lung metastasis ([place])    PET scan 05/05/13, RUL lung nodule  . 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 Nodule of right lung CT- 06/03/12   RIGHT UPPER LOBE  . Nodule of right lung 06/03/12   Upper Lobe  . On antineoplastic chemotherapy    TARCEVA  . Osteoporosis 2010  . Primary cancer of right upper lobe of lung ([place]) 04/22/2009   Qualifier: Diagnosis of  By: Nils Pyle CMA ([place]), Mearl Latin    .  Shortness of breath    hx lung ca   . Status post chemotherapy 2003   CARBOPLATIN/PACLITAXEL /STATUS POST CLINICAL TRAIL OF CELEBREX AND IRESSA AT [facility] FOR 1 YEAR  . Status post radiation therapy 2003   LEFT LUNG  . Status post radiation therapy 11/07/2005   WHOLE BRAIN: DR Larkin [facility]  . Status post radiation therapy 06/02/2008   GAMMA KNIFE OF RESECTED CAVITAY  . Thyroid adenoma    ?  [name] Thyroid cancer ([place]) 10/18/11 bx   adenoid nodules   . Yeast infection     Past Surgical History:  Procedure Laterality Date  . BRAIN SURGERY    . LEFT LOWER LOBECTOMY  05/2001  . LEFT OCCIPITAL CRANIOTOMY  09/21/2005    tumor  . LUNG LOBECTOMY    . NECK SURGERY    . ORIF ANKLE FRACTURE Left 10/02/2014   Procedure: OPEN REDUCTION INTERNAL FIXATION (ORIF) ANKLE FRACTURE;  Surgeon: Alta Corning, MD;  Location: [facility];  Service: Orthopedics;  Laterality: Left;  . ORIF FEMUR FRACTURE Right 06/11/2019   Procedure: OPEN REDUCTION INTERNAL FIXATION (ORIF) DISTAL FEMUR FRACTURE;  Surgeon: Renette Butters, MD;  Location: [facility];  Service: Orthopedics;  Laterality: Right;  . ORIF TIBIA PLATEAU Right 06/11/2019   Procedure: Open Reduction Internal Fixation (Orif) Tibial Plateau;  Surgeon: Renette Butters, MD;  Location: [place];  Service: Orthopedics;  Laterality: Right;  . RADICAL NECK DISSECTION  10/18/2011   Procedure: RADICAL NECK DISSECTION;  Surgeon: Izora Gala, MD;  Location: [facility];  Service: ENT;  Laterality: Right;  RIGHT MODIFIED NECK DISSECTION /POSSIBLE RIGHT THYROIDECTOM  . THYROIDECTOMY  10/18/2011   Procedure: THYROIDECTOMY WITH RADICAL NECK DISSECTION;  Surgeon: Izora Gala, MD;  Location: [facility];  Service: ENT;  Laterality: Right;     Home Medications:  Prior to Admission medications   Medication Sig Start Date End Date Taking? Authorizing Provider  Ascorbic Acid (VITAMIN C) 100 MG tablet Take 100 mg by mouth every evening. Gummie   Yes [provider]  cholecalciferol (VITAMIN D) 1000 UNITS tablet Take 1,000 Units by mouth every evening.    Yes [provider]  diphenoxylate-atropine (LOMOTIL) 2.5-0.025 MG tablet TAKE 1 TABLET BY MOUTH THREE TIMES DAILY AS NEEDED FOR DIARRHEA OR LOOSE STOOLS Patient taking differently: Take 1 tablet by mouth 4 (four) times daily as needed for diarrhea or loose stools.  11/19/19  Yes Valerie Chard, MD  donepezil (ARICEPT) 10 MG tablet Take 1 tablet (10 mg total) by mouth every evening. 12/15/19  Yes Valerie Chard, MD  Ibuprofen (ADVIL) 200 MG CAPS Take 1 capsule by mouth daily as needed (pain). Prn    Yes [provider]  loperamide (IMODIUM) 2  MG capsule Take 2 mg by mouth as needed for diarrhea or loose stools.    Yes [provider]  Multiple Vitamin (MULTI-VITAMINS) TABS Take 1 tablet by mouth every evening.    Yes [provider]  polyethylene glycol (MIRALAX / GLYCOLAX) 17 g packet Take 17 g by mouth daily. Patient taking differently: Take 17 g by mouth daily as needed for moderate constipation.  09/19/19  Yes Elie Confer, MD  TAGRISSO 80 MG tablet TAKE 1 TABLET (80MG ) BY MOUTH DAILY. Patient taking differently: Take 80 mg by mouth daily.  11/09/19  Yes Curt Bears, MD  vitamin B-12 (CYANOCOBALAMIN) 1000 MCG tablet Take 1,000 mcg by mouth every evening.   Yes [provider]  XARELTO 20 MG TABS tablet Take  1 tablet (20 mg total) by mouth every evening. 12/15/19  Yes Valerie Chard, MD  MAGNESIUM PO Take 1 tablet by mouth daily. Patient not taking: Reported on 02/14/2020    [provider]  metroNIDAZOLE (FLAGYL) 500 MG tablet Take 1 tablet (500 mg total) by mouth 2 (two) times daily. Patient not taking: Reported on 02/14/2020 11/23/19   Corena Herter, PA-C  nitrofurantoin, macrocrystal-monohydrate, (MACROBID) 100 MG capsule Take 1 capsule (100 mg total) by mouth 2 (two) times daily. Patient not taking: Reported on 02/14/2020 11/10/19   Valerie Chard, MD    Inpatient Medications: Scheduled Meds: . aspirin EC  81 mg Oral Daily  . enoxaparin (LOVENOX) injection  1 mg/kg Subcutaneous Q12H  . furosemide  40 mg Intravenous BID  . lisinopril  5 mg Oral Daily  . metoprolol succinate  12.5 mg Oral Daily  . potassium chloride  40 mEq Oral Q4H  . sodium chloride flush  3 mL Intravenous Q12H   Continuous Infusions: . sodium chloride     PRN Meds: sodium chloride, acetaminophen, ipratropium-albuterol, ondansetron (ZOFRAN) IV, polyethylene glycol, senna-docusate, sodium chloride flush  Allergies:    Allergies  Allergen Reactions  . Keflex [Cephalexin]     Social History:   Social  History   Socioeconomic History  . Marital status: Married    Spouse name: Not on file  . Number of children: 2  . Years of education: Not on file  . Highest education level: Not on file  Occupational History  . Occupation: Pharmacist, hospital    Comment: retired after 34 years,from [employer]  Tobacco Use  . Smoking status: Never Smoker  . Smokeless tobacco: Never Used  . Tobacco comment: smoked few years in college  Vaping Use  . Vaping Use: Never used  Substance and Sexual Activity  . Alcohol use: No  . Drug use: No  . Sexual activity: Yes    Birth control/protection: Post-menopausal  Other Topics Concern  . Not on file  Social History Narrative   ** Merged History Encounter **       Social Determinants of Health   Financial Resource Strain:   . Difficulty of Paying Living Expenses:   Food Insecurity:   . Worried About Charity fundraiser in the Last Year:   . Arboriculturist in the Last Year:   Transportation Needs:   . Film/video editor (Medical):   [name] Lack of Transportation (Non-Medical):   Physical Activity:   . Days of Exercise per Week:   . Minutes of Exercise per Session:   Stress:   . Feeling of Stress :   Social Connections:   . Frequency of Communication with Friends and Family:   . Frequency of Social Gatherings with Friends and Family:   . Attends Religious Services:   . Active Member of Clubs or Organizations:   . Attends Archivist Meetings:   [name] Marital Status:   Intimate Partner Violence:   . Fear of Current or Ex-Partner:   . Emotionally Abused:   [name] Physically Abused:   . Sexually Abused:     Family History:    Family History  Problem Relation Age of Onset  . Gait disorder Mother   . Cancer Mother        Meningioma  . Cancer Father        Pancreatic  . Heart failure Sister   . Cancer Maternal Aunt        menigeoma  ROS:  Please see the history of present illness.   All other ROS reviewed and negative.     Physical  Exam/Data:   Vitals:   02/15/20 0046 02/15/20 0049 02/15/20 0126 02/15/20 0616  BP:  118/60  114/61  Pulse:  93  90  Resp:  18  18  Temp:  98.3 F (36.8 C)  98.5 F (36.9 C)  TempSrc:  Oral  Oral  SpO2:  100%  93%  Weight: 69.6 kg 69.6 kg 69.6 kg   Height:  5\' 7"  (1.702 m)      Intake/Output Summary (Last 24 hours) at 02/15/2020 0758 Last data filed at 02/15/2020 9562 Gross per 24 hour  Intake 50 ml  Output 1500 ml  Net -1450 ml   Filed Weights   02/15/20 0046 02/15/20 0049 02/15/20 0126  Weight: 69.6 kg 69.6 kg 69.6 kg   Body mass index is 24.03 kg/m.  General:  Thin female sitting upright in bed in NAD HEENT: sclera anicteric  Neck: no JVD Vascular: No carotid bruits; distal pulses 2+ bilaterally Cardiac:  normal S1, S2; RRR; +murmur  Lungs:  Decreased breath sounds at LLL without overt wheezing, rhonchi or rales  Abd: NABS, soft, nontender, no hepatomegaly Ext: no edema Musculoskeletal: +kyphosis Skin: warm and dry  Neuro:  CNs 2-12 intact, no focal abnormalities noted Psych:  Normal affect   EKG:  The EKG was personally reviewed and demonstrates:  sinus rhythm with rate 93 bpm, PAC, LBBB - new from 05/2019 Telemetry:  Telemetry was personally reviewed and demonstrates:  Sinus rhythm with LBBB, rate 80s-100s, occasional PACs  Relevant CV Studies: Echo pending  Laboratory Data:  Chemistry Recent Labs  Lab 02/14/20 1837 02/15/20 0551  NA 140 136  K 3.3* 2.9*  CL 107 100  CO2 25 27  GLUCOSE 110* 107*  BUN 11 11  CREATININE 0.68 0.85  CALCIUM 8.7* 8.8*  GFRNONAA >60 >60  GFRAA >60 >60  ANIONGAP 8 9    Recent Labs  Lab 02/14/20 1837  PROT 5.9*  ALBUMIN 3.2*  AST 17  ALT 10  ALKPHOS 55  BILITOT 0.9   Hematology Recent Labs  Lab 02/14/20 1746 02/15/20 0551  WBC 8.3 6.2  RBC 3.61* 3.67*  HGB 10.3* 10.8*  HCT 33.6* 33.7*  MCV 93.1 91.8  MCH 28.5 29.4  MCHC 30.7 32.0  RDW 15.5 15.3  PLT 208 224   Cardiac EnzymesNo results for  input(s): TROPONINI in the last 168 hours. No results for input(s): TROPIPOC in the last 168 hours.  BNP Recent Labs  Lab 02/14/20 1746  BNP 985.5*    DDimer  Recent Labs  Lab 02/15/20 0550  DDIMER 0.99*    Radiology/Studies:  DG Chest 2 View  Result Date: 02/14/2020 CLINICAL DATA:  Shortness of breath.  Lung cancer. EXAM: CHEST - 2 VIEW COMPARISON:  06/16/2019 FINDINGS: Cardiomegaly, vascular congestion. Small left pleural effusion with left lower lobe atelectasis or infiltrate. Postoperative changes on the left. No acute bony abnormality. IMPRESSION: Cardiomegaly with vascular congestion. Small left pleural effusion with left base atelectasis or infiltrate. Electronically Signed   By: Rolm Baptise M.D.   On: 02/14/2020 17:49    Assessment and Plan:   1. New onset CHF: patient presented with progressive DOE. CXR with mild cardiomegaly and vascular congestion. BNP elevated to 900s. She was given IV lasix 40mg  x1 dose with UOP -1.5L. Symptoms improved with IV lasix. She was started on metoprolol succinate 12.5mg  daily.  - Will  follow-up echocardiogram results - Can continue IV lasix though anticipate transition to po tomorrow.  - Continue metoprolol succinate 12.5mg  daily  - Favor holding lisinopril given soft BP's - will await echo results to determine need for ACEi/ARB/ARNI - Continue to monitor strict I&Os and daily weights  2. New LBBB: EKG this admission with sinus rhythm with new LBBB compared to 05/2019. No evidence of atrial fibrillation. She has no chest pain complaints, though has had progressive DOE for the past 3 days.  HsTrop elevated to 111>109>106 - low flat trend not c/w ACS.  - Will follow-up echocardiogram results - if EF is reduced, would likely benefit from a heart catheterization to define coronary anatomy  3. Murmur: noted on exam today.  - Will await echo results.   4. HLD: LDL 155 this admission; goal <100 for patients without CAD - Favor starting  atorvastatin 40mg  daily   5. Hypokalemia: K 3.3 on admission, down to 2.9 today with IV lasix. Ordered for 40 mEq x3 doses today. Patient reports difficulty with size of K pills - Will ask RN to crush tabs - Continue to monitor closely and replete as needed to maintain K >4  6. History of DVT: home xarelto held. On lovenox at this time.  - Continue lovenox until clear no further invasive testing is needed.    For questions or updates, please contact [place] Please consult [email or web address] for contact info under Cardiology/STEMI.   Signed, Abigail Butts, PA-C  02/15/2020 7:58 AM [phone]

## 2020-02-15 NOTE — Progress Notes (Signed)
ANTICOAGULATION CONSULT NOTE - Initial Consult  Pharmacy Consult for Enoxaparin Indication: new onset afib, hx DVT, elevated troponin  Allergies  Allergen Reactions  . Keflex [Cephalexin]     Patient Measurements: Height: 5\' 7"  (170.2 cm) Weight: 69.6 kg (153 lb 7 oz) IBW/kg (Calculated) : 61.6  Vital Signs: Temp: 98.3 F (36.8 C) (06/28 0049) Temp Source: Oral (06/28 0049) BP: 118/60 (06/28 0049) Pulse Rate: 93 (06/28 0049)  Labs: Recent Labs    02/14/20 1746 02/14/20 1837  HGB 10.3*  --   HCT 33.6*  --   PLT 208  --   CREATININE  --  0.68  TROPONINIHS  --  111*    Estimated Creatinine Clearance: 60 mL/min (by C-G formula based on SCr of 0.68 mg/dL).   Medical History: Past Medical History:  Diagnosis Date  . Abnormal Pap smear 2006  . Anal fistula   . Ankle fracture   . Anxiety   . Arthritis   . Atrophic vaginitis 2008  . Cataract   . Dementia ([place]) 2009  . Dyspareunia 2008  . H/O osteoporosis   . H/O varicella   . H/O vitamin D deficiency   . Headache 07/24/2016  . Heart murmur   . History of measles, mumps, or rubella   . History of radiation therapy 07/28/13- 08/10/13   right lung metastasis 5000 cGy 10 sessions  . Hypertension   . Lung cancer ([place]) dx'd 2002  . Lung cancer ([place])   . Lung cancer ([place])   . Lung metastasis ([place])    PET scan 05/05/13, RUL lung nodule  . 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 Nodule of right lung CT- 06/03/12   RIGHT UPPER LOBE  . Nodule of right lung 06/03/12   Upper Lobe  . On antineoplastic chemotherapy    TARCEVA  . Osteoporosis 2010  . Primary cancer of right upper lobe of lung ([place]) 04/22/2009   Qualifier: Diagnosis of  By: Nils Pyle CMA ([place]), Mearl Latin    . Shortness of breath    hx lung ca   . Status post chemotherapy 2003   CARBOPLATIN/PACLITAXEL /STATUS POST CLINICAL TRAIL OF CELEBREX AND IRESSA AT [facility] FOR 1 YEAR  . Status post radiation therapy 2003    LEFT LUNG  . Status post radiation therapy 11/07/2005   WHOLE BRAIN: DR Larkin Ina [facility]  . Status post radiation therapy 06/02/2008   GAMMA KNIFE OF RESECTED CAVITAY  . Thyroid adenoma    ?  [name] Thyroid cancer ([place]) 10/18/11 bx   adenoid nodules   . Yeast infection     Medications: Rivaroxaban 20 mg PO daily PTA Patient reported last dose of rivaroxaban: 6/27 ~11:00 AM  Assessment: Pt is a 7 yoF with PMH significant for metastatic NSCLC. Anticoagulation being transitioned from rivaroxaban to therapeutic LMWH.   Today, 02/15/20 -CBC: Hgb (10.3) slightly low; Plt (208) WNL -SCr 0.7, CrCl ~60 mL/min  Plan:   Start LMWH 24 hours after last rivaroxaban dose  Enoxaparin 1 mg/kg subQ q12h  Follow CBC, renal function  Lenis Noon, PharmD 02/15/2020,1:13 AM

## 2020-02-15 NOTE — Progress Notes (Signed)
PROGRESS NOTE    Valerie [name]  AYT:[id number] DOB: 02-Feb-1946 DOA: 02/14/2020 PCP: [name], MD   Brief Narrative:  74 year old with history of metastatic non-small cell lung cancer, adenocarcinoma with brain metastases not on home oxygen, lobectomy, craniotomy, radiation, thyroidectomy, on oral chemo, dementia, anemia of chronic disease comes to the hospital with shortness of breath.  Admitted with concerns of new onset A. fib, CHF exacerbation and UTI.   Assessment & Plan:   Principal Problem:   Congestive heart failure (CHF) (HCC) Active Problems:   Malignant neoplasm of brain (HCC)   Lung metastasis (HCC)   Dementia ([place])   History of DVT of lower extremity  Dyspnea on exertion with left-sided pleural effusion -Unclear if this effusion is related to malignancy versus CHF -Echocardiogram done-results pending -Lasix 40 mg IV twice daily, daily weights, fluid restriction -Cardiology consulted -TSH normal  -LDL 155 -Toprol-XL 12.5 mg daily  Sinus tachycardia with new left bundle branch block Prolonged QTC with hypokalemia -Rate controlled.  Intermittent.  EKG no acute ACS noted. likely demand ischemia.  Trend troponin -Repeat EKG -Aggressively replete electrolytes -We will need to clarify outpatient anticoagulation.  Currently on Lovenox 1 mg/kg every 12 hours  Hyperlipidemia -LDL 155.  Lipitor daily started  Dirty UA, urinary tract infection, symptomatic -Urine cultures, previously grown E. coli which was pansensitive -Empiric IV Rocephin  Anemia of chronic disease -Hemoglobin at baseline around 11.0.  History of metastatic non-small cell lung cancer with brain mets -Currently on Tagrisso  DVT prophylaxis: Lovenox 1 mg/kg every 12 hours Code Status: Full code Family Communication: None at bedside  Status is: Inpatient  Remains inpatient appropriate because:IV treatments appropriate due to intensity of illness or inability to take PO   Dispo:  The patient is from: Home              Anticipated d/c is to: Home              Anticipated d/c date is: 2 days              Patient currently is not medically stable to d/c.  Ongoing evaluation for cardiomyopathy, abnormal EKG and urinary tract infection.  Not stable for discharge today.  Body mass index is 24.03 kg/m.  Pressure Injury 09/17/19 Heel Deep Tissue Pressure Injury - Purple or maroon localized area of discolored intact skin or blood-filled blister due to damage of underlying soft tissue from pressure and/or shear. (Active)  09/17/19 0948  Location: Heel  Location Orientation:   Staging: Deep Tissue Pressure Injury - Purple or maroon localized area of discolored intact skin or blood-filled blister due to damage of underlying soft tissue from pressure and/or shear.  Wound Description (Comments):   Present on Admission: Yes      Subjective: Tells me her shortness of breath is slightly better.  She is having burning with urination.  Review of Systems Otherwise negative except as per HPI, including: General: Denies fever, chills, night sweats or unintended weight loss. Resp: Denies hemoptysis Cardiac: Denies chest pain, palpitations, orthopnea, paroxysmal nocturnal dyspnea. GI: Denies abdominal pain, nausea, vomiting, diarrhea or constipation GU: Denies incontinence MS: Denies muscle aches, joint pain or swelling Neuro: Denies headache, neurologic deficits (focal weakness, numbness, tingling), abnormal gait Psych: Denies anxiety, depression, SI/HI/AVH Skin: Denies new rashes or lesions ID: Denies sick contacts, exotic exposures, travel  Examination:  General exam: Appears calm and comfortable  Respiratory system: Bibasilar crackles Cardiovascular system: S1 & S2 heard, RRR. No JVD, murmurs,  rubs, gallops or clicks. No pedal edema. Gastrointestinal system: Abdomen is nondistended, soft and nontender. No organomegaly or masses felt. Normal bowel sounds heard. Central  nervous system: Alert and oriented. No focal neurological deficits. Extremities: Symmetric 5 x 5 power. Skin: No rashes, lesions or ulcers Psychiatry: Judgement and insight appear normal. Mood & affect appropriate.   Objective: Vitals:   02/15/20 0046 02/15/20 0049 02/15/20 0126 02/15/20 0616  BP:  118/60  114/61  Pulse:  93  90  Resp:  18  18  Temp:  98.3 F (36.8 C)  98.5 F (36.9 C)  TempSrc:  Oral  Oral  SpO2:  100%  93%  Weight: 69.6 kg 69.6 kg 69.6 kg   Height:  5\' 7"  (1.702 m)      Intake/Output Summary (Last 24 hours) at 02/15/2020 0726 Last data filed at 02/15/2020 0960 Gross per 24 hour  Intake 50 ml  Output 1500 ml  Net -1450 ml   Filed Weights   02/15/20 0046 02/15/20 0049 02/15/20 0126  Weight: 69.6 kg 69.6 kg 69.6 kg     Data Reviewed:   CBC: Recent Labs  Lab 02/14/20 1746 02/15/20 0551  WBC 8.3 6.2  NEUTROABS 7.0  --   HGB 10.3* 10.8*  HCT 33.6* 33.7*  MCV 93.1 91.8  PLT 208 454   Basic Metabolic Panel: Recent Labs  Lab 02/14/20 1746 02/14/20 1837 02/15/20 0551  NA  --  140 136  K  --  3.3* 2.9*  CL  --  107 100  CO2  --  25 27  GLUCOSE  --  110* 107*  BUN  --  11 11  CREATININE  --  0.68 0.85  CALCIUM  --  8.7* 8.8*  MG 1.9  --   --    GFR: Estimated Creatinine Clearance: 56.5 mL/min (by C-G formula based on SCr of 0.85 mg/dL). Liver Function Tests: Recent Labs  Lab 02/14/20 1837  AST 17  ALT 10  ALKPHOS 55  BILITOT 0.9  PROT 5.9*  ALBUMIN 3.2*   Recent Labs  Lab 02/14/20 1837  LIPASE 25   No results for input(s): AMMONIA in the last 168 hours. Coagulation Profile: No results for input(s): INR, PROTIME in the last 168 hours. Cardiac Enzymes: No results for input(s): CKTOTAL, CKMB, CKMBINDEX, TROPONINI in the last 168 hours. BNP (last 3 results) No results for input(s): PROBNP in the last 8760 hours. HbA1C: No results for input(s): HGBA1C in the last 72 hours. CBG: No results for input(s): GLUCAP in the last 168  hours. Lipid Profile: Recent Labs    02/15/20 0550  CHOL 242*  HDL 68  LDLCALC 155*  TRIG 97  CHOLHDL 3.6   Thyroid Function Tests: Recent Labs    02/15/20 0550  TSH 2.095   Anemia Panel: No results for input(s): VITAMINB12, FOLATE, FERRITIN, TIBC, IRON, RETICCTPCT in the last 72 hours. Sepsis Labs: No results for input(s): PROCALCITON, LATICACIDVEN in the last 168 hours.  Recent Results (from the past 240 hour(s))  SARS Coronavirus 2 by RT PCR (hospital order, performed in [facility] hospital lab) Nasopharyngeal Nasopharyngeal Swab     Status: None   Collection Time: 02/14/20  5:46 PM   Specimen: Nasopharyngeal Swab  Result Value Ref Range Status   SARS Coronavirus 2 NEGATIVE NEGATIVE Final    Comment: (NOTE) SARS-CoV-2 target nucleic acids are NOT DETECTED.  The SARS-CoV-2 RNA is generally detectable in upper and lower respiratory specimens during the acute phase of infection.  The lowest concentration of SARS-CoV-2 viral copies this assay can detect is 250 copies / mL. A negative result does not preclude SARS-CoV-2 infection and should not be used as the sole basis for treatment or other patient management decisions.  A negative result may occur with improper specimen collection / handling, submission of specimen other than nasopharyngeal swab, presence of viral mutation(s) within the areas targeted by this assay, and inadequate number of viral copies (<250 copies / mL). A negative result must be combined with clinical observations, patient history, and epidemiological information.  Fact Sheet for Patients:   StrictlyIdeas.no  Fact Sheet for Healthcare Providers: BankingDealers.co.za  This test is not yet approved or  cleared by the [place] FDA and has been authorized for detection and/or diagnosis of SARS-CoV-2 by FDA under an Emergency Use Authorization (EUA).  This EUA will remain in effect (meaning this  test can be used) for the duration of the COVID-19 declaration under Section 564(b)(1) of the Act, 21 U.S.C. section 360bbb-3(b)(1), unless the authorization is terminated or revoked sooner.  Performed at [facility], [place] [address], [place], [place] [address]          Radiology Studies: DG Chest 2 View  Result Date: 02/14/2020 CLINICAL DATA:  Shortness of breath.  Lung cancer. EXAM: CHEST - 2 VIEW COMPARISON:  06/16/2019 FINDINGS: Cardiomegaly, vascular congestion. Small left pleural effusion with left lower lobe atelectasis or infiltrate. Postoperative changes on the left. No acute bony abnormality. IMPRESSION: Cardiomegaly with vascular congestion. Small left pleural effusion with left base atelectasis or infiltrate. Electronically Signed   By: Rolm Baptise M.D.   On: 02/14/2020 17:49        Scheduled Meds: . aspirin EC  81 mg Oral Daily  . enoxaparin (LOVENOX) injection  1 mg/kg Subcutaneous Q12H  . furosemide  20 mg Intravenous BID  . lisinopril  5 mg Oral Daily  . metoprolol succinate  12.5 mg Oral Daily  . sodium chloride flush  3 mL Intravenous Q12H   Continuous Infusions: . sodium chloride       LOS: 1 day   Time spent= 35 mins    Ankit Arsenio Loader, MD [facility]  If 7PM-7AM, please contact night-coverage  02/15/2020, 7:26 AM

## 2020-02-15 NOTE — H&P (View-Only) (Signed)
Cardiology Consultation:   Patient ID: Valerie [name]; [id number]; 11/17/1945   Admit date: 02/14/2020 Date of Consult: 02/15/2020  Primary Care Provider: [name], MD Primary Cardiologist: New to [place]; Dr. Harrell [name] Primary Electrophysiologist:  None   Patient Profile:   Valerie [name] is a 74 y.o. female with a PMH of metastatic NSCLC (diagnosed 30 yr ago, on oral chemo x10 years), history of DVT on anticoagulation, bedbound since 05/2019, who is being seen today for the evaluation of SOB and new onset atrial fibrillation at the request of Dr. Roslynn [name].  History of Present Illness:   Valerie [name] was in her usual state of health until 3 days ago when she began experiencing progressive SOB and DOE affecting her daily activities. She recalls some intermittent nausea as well which is not unusual for her. No complaints of chest pain, palpitations, dizziness, lightheadedness, or syncope.   She has no prior cardiac history. She reports her sister has a PPM, though no known family history of of CAD/MI or CHF. She has never smoked and denies ETOH/ilicit drug use.   At the time of this evaluation she reports improvement in her SOB. She reported onset of SOB was a few days ago. She is bed bound since a leg fracture 05/2019 so activity is quite limited. She reported SOB was worse when laying down and improved with sitting up. She has had PND for the past several days. No complaints of chest pain or LE edema. She reports her husband frequently picks up take-out from restaurants. She reports compliance with her xarelto. 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 Labs notable for K 3.3>2.9, Mg 1.9, Cr 0.6>0.8, Hgb 10.8, PLT 224, TSh wnl, DDimer 0.99, HsTrop 111, BNP 985, LDL 155. EKG with sinus  rhythm with rate 93 bpm, PAC, LBBB - new from 05/2019. CXR showed cardiomegaly with vascular congestion and small left pleural effusion with left base atelectasis vs infiltrate. Echo Is pending. Patient was given IV lasix 40mg  with UOP -1.5L with at least 1 unmeasured urine output occurrence.   Past Medical History:  Diagnosis Date  . Abnormal Pap smear 2006  . Anal fistula   . Ankle fracture   . Anxiety   . Arthritis   . Atrophic vaginitis 2008  . Cataract   . Dementia ([place]) 2009  . Dyspareunia 2008  . H/O osteoporosis   . H/O varicella   . H/O vitamin D deficiency   . Headache 07/24/2016  . Heart murmur   . History of measles, mumps, or rubella   . History of radiation therapy 07/28/13- 08/10/13   right lung metastasis 5000 cGy 10 sessions  . Hypertension   . Lung cancer ([place]) dx'd 2002  . Lung cancer ([place])   . Lung cancer ([place])   . Lung metastasis ([place])    PET scan 05/05/13, RUL lung nodule  . Metastasis to brain Valerie [name]) dx'd 2008  . 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 Nodule of right lung CT- 06/03/12   RIGHT UPPER LOBE  . Nodule of right lung 06/03/12   Upper Lobe  . On antineoplastic chemotherapy    TARCEVA  . Osteoporosis 2010  . Primary cancer of right upper lobe of lung ([place]) 04/22/2009   Qualifier: Diagnosis of  By: Valerie [name] CMA ([place]), Valerie [name]    .  Shortness of breath    hx lung ca   . Status post chemotherapy 2003   CARBOPLATIN/PACLITAXEL /STATUS POST CLINICAL TRAIL OF CELEBREX AND IRESSA AT [facility] FOR 1 YEAR  . Status post radiation therapy 2003   LEFT LUNG  . Status post radiation therapy 11/07/2005   WHOLE BRAIN: DR Valerie [name]  . Status post radiation therapy 06/02/2008   GAMMA KNIFE OF RESECTED CAVITAY  . Thyroid adenoma    ?  [name] Thyroid cancer ([place]) 10/18/11 bx   adenoid nodules   . Yeast infection     Past Surgical History:  Procedure Laterality Date  . BRAIN SURGERY    . LEFT LOWER LOBECTOMY  05/2001  . LEFT OCCIPITAL CRANIOTOMY  09/21/2005    tumor  . LUNG LOBECTOMY    . NECK SURGERY    . ORIF ANKLE FRACTURE Left 10/02/2014   Procedure: OPEN REDUCTION INTERNAL FIXATION (ORIF) ANKLE FRACTURE;  Surgeon: Alta Corning, MD;  Location: [facility];  Service: Orthopedics;  Laterality: Left;  . ORIF FEMUR FRACTURE Right 06/11/2019   Procedure: OPEN REDUCTION INTERNAL FIXATION (ORIF) DISTAL FEMUR FRACTURE;  Surgeon: Renette Butters, MD;  Location: [facility];  Service: Orthopedics;  Laterality: Right;  . ORIF TIBIA PLATEAU Right 06/11/2019   Procedure: Open Reduction Internal Fixation (Orif) Tibial Plateau;  Surgeon: Renette Butters, MD;  Location: [facility];  Service: Orthopedics;  Laterality: Right;  . RADICAL NECK DISSECTION  10/18/2011   Procedure: RADICAL NECK DISSECTION;  Surgeon: Izora Gala, MD;  Location: [facility];  Service: ENT;  Laterality: Right;  RIGHT MODIFIED NECK DISSECTION /POSSIBLE RIGHT THYROIDECTOM  . THYROIDECTOMY  10/18/2011   Procedure: THYROIDECTOMY WITH RADICAL NECK DISSECTION;  Surgeon: Izora Gala, MD;  Location: [facility];  Service: ENT;  Laterality: Right;     Home Medications:  Prior to Admission medications   Medication Sig Start Date End Date Taking? Authorizing Provider  Ascorbic Acid (VITAMIN C) 100 MG tablet Take 100 mg by mouth every evening. Gummie   Yes [provider]  cholecalciferol (VITAMIN D) 1000 UNITS tablet Take 1,000 Units by mouth every evening.    Yes [provider]  diphenoxylate-atropine (LOMOTIL) 2.5-0.025 MG tablet TAKE 1 TABLET BY MOUTH THREE TIMES DAILY AS NEEDED FOR DIARRHEA OR LOOSE STOOLS Patient taking differently: Take 1 tablet by mouth 4 (four) times daily as needed for diarrhea or loose stools.  11/19/19  Yes Valerie Chard, MD  donepezil (ARICEPT) 10 MG tablet Take 1 tablet (10 mg total) by mouth every evening. 12/15/19  Yes Valerie Chard, MD  Ibuprofen (ADVIL) 200 MG CAPS Take 1 capsule by mouth daily as needed (pain). Prn    Yes [provider]  loperamide (IMODIUM) 2  MG capsule Take 2 mg by mouth as needed for diarrhea or loose stools.    Yes [provider]  Multiple Vitamin (MULTI-VITAMINS) TABS Take 1 tablet by mouth every evening.    Yes [provider]  polyethylene glycol (MIRALAX / GLYCOLAX) 17 g packet Take 17 g by mouth daily. Patient taking differently: Take 17 g by mouth daily as needed for moderate constipation.  09/19/19  Yes [place], Waneta Martins, MD  TAGRISSO 80 MG tablet TAKE 1 TABLET (80MG ) BY MOUTH DAILY. Patient taking differently: Take 80 mg by mouth daily.  11/09/19  Yes Curt Bears, MD  vitamin B-12 (CYANOCOBALAMIN) 1000 MCG tablet Take 1,000 mcg by mouth every evening.   Yes [provider]  XARELTO 20 MG TABS tablet Take  1 tablet (20 mg total) by mouth every evening. 12/15/19  Yes Valerie Chard, MD  MAGNESIUM PO Take 1 tablet by mouth daily. Patient not taking: Reported on 02/14/2020    [provider]  metroNIDAZOLE (FLAGYL) 500 MG tablet Take 1 tablet (500 mg total) by mouth 2 (two) times daily. Patient not taking: Reported on 02/14/2020 11/23/19   Corena Herter, PA-C  nitrofurantoin, macrocrystal-monohydrate, (MACROBID) 100 MG capsule Take 1 capsule (100 mg total) by mouth 2 (two) times daily. Patient not taking: Reported on 02/14/2020 11/10/19   Valerie Chard, MD    Inpatient Medications: Scheduled Meds: . aspirin EC  81 mg Oral Daily  . enoxaparin (LOVENOX) injection  1 mg/kg Subcutaneous Q12H  . furosemide  40 mg Intravenous BID  . lisinopril  5 mg Oral Daily  . metoprolol succinate  12.5 mg Oral Daily  . potassium chloride  40 mEq Oral Q4H  . sodium chloride flush  3 mL Intravenous Q12H   Continuous Infusions: . sodium chloride     PRN Meds: sodium chloride, acetaminophen, ipratropium-albuterol, ondansetron (ZOFRAN) IV, polyethylene glycol, senna-docusate, sodium chloride flush  Allergies:    Allergies  Allergen Reactions  . Keflex [Cephalexin]     Social History:   Social  History   Socioeconomic History  . Marital status: Married    Spouse name: Not on file  . Number of children: 2  . Years of education: Not on file  . Highest education level: Not on file  Occupational History  . Occupation: Pharmacist, [name]    Comment: retired after 33 years,from [employer]  Tobacco Use  . Smoking status: Never Smoker  . Smokeless tobacco: Never Used  . Tobacco comment: smoked few years in college  Vaping Use  . Vaping Use: Never used  Substance and Sexual Activity  . Alcohol use: No  . Drug use: No  . Sexual activity: Yes    Birth control/protection: Post-menopausal  Other Topics Concern  . Not on file  Social History Narrative   ** Merged History Encounter **       Social Determinants of Health   Financial Resource Strain:   . Difficulty of Paying Living Expenses:   Food Insecurity:   . Worried About Charity fundraiser in the Last Year:   . Arboriculturist in the Last Year:   Transportation Needs:   . Film/video editor (Medical):   [name] Lack of Transportation (Non-Medical):   Physical Activity:   . Days of Exercise per Week:   . Minutes of Exercise per Session:   Stress:   . Feeling of Stress :   Social Connections:   . Frequency of Communication with Friends and Family:   . Frequency of Social Gatherings with Friends and Family:   . Attends Religious Services:   . Active Member of Clubs or Organizations:   . Attends Archivist Meetings:   [name] Marital Status:   Intimate Partner Violence:   . Fear of Current or Ex-Partner:   . Emotionally Abused:   [name] Physically Abused:   . Sexually Abused:     Family History:    Family History  Problem Relation Age of Onset  . Gait disorder Mother   . Cancer Mother        Meningioma  . Cancer Father        Pancreatic  . Heart failure Sister   . Cancer Maternal Aunt        menigeoma  ROS:  Please see the history of present illness.   All other ROS reviewed and negative.     Physical  Exam/Data:   Vitals:   02/15/20 0046 02/15/20 0049 02/15/20 0126 02/15/20 0616  BP:  118/60  114/61  Pulse:  93  90  Resp:  18  18  Temp:  98.3 F (36.8 C)  98.5 F (36.9 C)  TempSrc:  Oral  Oral  SpO2:  100%  93%  Weight: 69.6 kg 69.6 kg 69.6 kg   Height:  5\' 7"  (1.702 m)      Intake/Output Summary (Last 24 hours) at 02/15/2020 0758 Last data filed at 02/15/2020 1962 Gross per 24 hour  Intake 50 ml  Output 1500 ml  Net -1450 ml   Filed Weights   02/15/20 0046 02/15/20 0049 02/15/20 0126  Weight: 69.6 kg 69.6 kg 69.6 kg   Body mass index is 24.03 kg/m.  General:  Thin female sitting upright in bed in NAD HEENT: sclera anicteric  Neck: no JVD Vascular: No carotid bruits; distal pulses 2+ bilaterally Cardiac:  normal S1, S2; RRR; +murmur  Lungs:  Decreased breath sounds at LLL without overt wheezing, rhonchi or rales  Abd: NABS, soft, nontender, no hepatomegaly Ext: no edema Musculoskeletal: +kyphosis Skin: warm and dry  Neuro:  CNs 2-12 intact, no focal abnormalities noted Psych:  Normal affect   EKG:  The EKG was personally reviewed and demonstrates:  sinus rhythm with rate 93 bpm, PAC, LBBB - new from 05/2019 Telemetry:  Telemetry was personally reviewed and demonstrates:  Sinus rhythm with LBBB, rate 80s-100s, occasional PACs  Relevant CV Studies: Echo pending  Laboratory Data:  Chemistry Recent Labs  Lab 02/14/20 1837 02/15/20 0551  NA 140 136  K 3.3* 2.9*  CL 107 100  CO2 25 27  GLUCOSE 110* 107*  BUN 11 11  CREATININE 0.68 0.85  CALCIUM 8.7* 8.8*  GFRNONAA >60 >60  GFRAA >60 >60  ANIONGAP 8 9    Recent Labs  Lab 02/14/20 1837  PROT 5.9*  ALBUMIN 3.2*  AST 17  ALT 10  ALKPHOS 55  BILITOT 0.9   Hematology Recent Labs  Lab 02/14/20 1746 02/15/20 0551  WBC 8.3 6.2  RBC 3.61* 3.67*  HGB 10.3* 10.8*  HCT 33.6* 33.7*  MCV 93.1 91.8  MCH 28.5 29.4  MCHC 30.7 32.0  RDW 15.5 15.3  PLT 208 224   Cardiac EnzymesNo results for  input(s): TROPONINI in the last 168 hours. No results for input(s): TROPIPOC in the last 168 hours.  BNP Recent Labs  Lab 02/14/20 1746  BNP 985.5*    DDimer  Recent Labs  Lab 02/15/20 0550  DDIMER 0.99*    Radiology/Studies:  DG Chest 2 View  Result Date: 02/14/2020 CLINICAL DATA:  Shortness of breath.  Lung cancer. EXAM: CHEST - 2 VIEW COMPARISON:  06/16/2019 FINDINGS: Cardiomegaly, vascular congestion. Small left pleural effusion with left lower lobe atelectasis or infiltrate. Postoperative changes on the left. No acute bony abnormality. IMPRESSION: Cardiomegaly with vascular congestion. Small left pleural effusion with left base atelectasis or infiltrate. Electronically Signed   By: Rolm Baptise M.D.   On: 02/14/2020 17:49    Assessment and Plan:   1. New onset CHF: patient presented with progressive DOE. CXR with mild cardiomegaly and vascular congestion. BNP elevated to 900s. She was given IV lasix 40mg  x1 dose with UOP -1.5L. Symptoms improved with IV lasix. She was started on metoprolol succinate 12.5mg  daily.  - Will  follow-up echocardiogram results - Can continue IV lasix though anticipate transition to po tomorrow.  - Continue metoprolol succinate 12.5mg  daily  - Favor holding lisinopril given soft BP's - will await echo results to determine need for ACEi/ARB/ARNI - Continue to monitor strict I&Os and daily weights  2. New LBBB: EKG this admission with sinus rhythm with new LBBB compared to 05/2019. No evidence of atrial fibrillation. She has no chest pain complaints, though has had progressive DOE for the past 3 days.  HsTrop elevated to 111>109>106 - low flat trend not c/w ACS.  - Will follow-up echocardiogram results - if EF is reduced, would likely benefit from a heart catheterization to define coronary anatomy  3. Murmur: noted on exam today.  - Will await echo results.   4. HLD: LDL 155 this admission; goal <100 for patients without CAD - Favor starting  atorvastatin 40mg  daily   5. Hypokalemia: K 3.3 on admission, down to 2.9 today with IV lasix. Ordered for 40 mEq x3 doses today. Patient reports difficulty with size of K pills - Will ask RN to crush tabs - Continue to monitor closely and replete as needed to maintain K >4  6. History of DVT: home xarelto held. On lovenox at this time.  - Continue lovenox until clear no further invasive testing is needed.    For questions or updates, please contact [place] Please consult [email or web address] for contact info under Cardiology/STEMI.   Signed, Abigail Butts, PA-C  02/15/2020 7:58 AM [phone]

## 2020-02-16 ENCOUNTER — Encounter (HOSPITAL_COMMUNITY)
Admission: EM | Disposition: A | Discharge status: Home / Self Care | Payer: Self-pay | Source: Home / Self Care | Attending: Internal Medicine

## 2020-02-16 DIAGNOSIS — R778 Other specified abnormalities of plasma proteins: Secondary | ICD-10-CM

## 2020-02-16 DIAGNOSIS — I2510 Atherosclerotic heart disease of native coronary artery without angina pectoris: Secondary | ICD-10-CM

## 2020-02-16 DIAGNOSIS — I5041 Acute combined systolic (congestive) and diastolic (congestive) heart failure: Secondary | ICD-10-CM

## 2020-02-16 DIAGNOSIS — I420 Dilated cardiomyopathy: Secondary | ICD-10-CM

## 2020-02-16 HISTORY — PX: INTRAVASCULAR ULTRASOUND/IVUS: CATH118244

## 2020-02-16 HISTORY — PX: RIGHT/LEFT HEART CATH AND CORONARY ANGIOGRAPHY: CATH118266

## 2020-02-16 LAB — POCT I-STAT EG7
Acid-Base Excess: 3 mmol/L — ABNORMAL HIGH (ref 0.0–2.0)
Acid-Base Excess: 4 mmol/L — ABNORMAL HIGH (ref 0.0–2.0)
Bicarbonate: 27.7 mmol/L (ref 20.0–28.0)
Bicarbonate: 28.4 mmol/L — ABNORMAL HIGH (ref 20.0–28.0)
Calcium, Ion: 1.25 mmol/L (ref 1.15–1.40)
Calcium, Ion: 1.26 mmol/L (ref 1.15–1.40)
HCT: 33 % — ABNORMAL LOW (ref 36.0–46.0)
HCT: 33 % — ABNORMAL LOW (ref 36.0–46.0)
Hemoglobin: 11.2 g/dL — ABNORMAL LOW (ref 12.0–15.0)
Hemoglobin: 11.2 g/dL — ABNORMAL LOW (ref 12.0–15.0)
O2 Saturation: 75 %
O2 Saturation: 75 %
Potassium: 3.5 mmol/L (ref 3.5–5.1)
Potassium: 3.6 mmol/L (ref 3.5–5.1)
Sodium: 138 mmol/L (ref 135–145)
Sodium: 139 mmol/L (ref 135–145)
TCO2: 29 mmol/L (ref 22–32)
TCO2: 30 mmol/L (ref 22–32)
pCO2, Ven: 41.2 mmHg — ABNORMAL LOW (ref 44.0–60.0)
pCO2, Ven: 41.4 mmHg — ABNORMAL LOW (ref 44.0–60.0)
pH, Ven: 7.436 — ABNORMAL HIGH (ref 7.250–7.430)
pH, Ven: 7.445 — ABNORMAL HIGH (ref 7.250–7.430)
pO2, Ven: 38 mmHg (ref 32.0–45.0)
pO2, Ven: 39 mmHg (ref 32.0–45.0)

## 2020-02-16 LAB — BASIC METABOLIC PANEL
Anion gap: 10 (ref 5–15)
Anion gap: 10 (ref 5–15)
BUN: 14 mg/dL (ref 8–23)
BUN: 13 mg/dL (ref 8–23)
CO2: 27 mmol/L (ref 22–32)
CO2: 27 mmol/L (ref 22–32)
Calcium: 9.1 mg/dL (ref 8.9–10.3)
Calcium: 9 mg/dL (ref 8.9–10.3)
Chloride: 102 mmol/L (ref 98–111)
Chloride: 102 mmol/L (ref 98–111)
Creatinine, Ser: 0.9 mg/dL (ref 0.44–1.00)
Creatinine, Ser: 0.93 mg/dL (ref 0.44–1.00)
GFR calc Af Amer: >60 mL/min (ref >60)
GFR calc Af Amer: >60 mL/min (ref >60)
GFR calc non Af Amer: >60 mL/min (ref >60)
GFR calc non Af Amer: >60 mL/min (ref >60)
Glucose, Bld: 98 mg/dL (ref 70–99)
Glucose, Bld: 122 mg/dL — ABNORMAL HIGH (ref 70–99)
Potassium: 3.3 mmol/L — ABNORMAL LOW (ref 3.5–5.1)
Potassium: 3.8 mmol/L (ref 3.5–5.1)
Sodium: 139 mmol/L (ref 135–145)
Sodium: 139 mmol/L (ref 135–145)

## 2020-02-16 LAB — CBC
HCT: 32.7 % — ABNORMAL LOW (ref 36.0–46.0)
Hemoglobin: 10.3 g/dL — ABNORMAL LOW (ref 12.0–15.0)
MCH: 28.8 pg (ref 26.0–34.0)
MCHC: 31.5 g/dL (ref 30.0–36.0)
MCV: 91.3 fL (ref 80.0–100.0)
Platelets: 242 10*3/uL (ref 150–400)
RBC: 3.58 MIL/uL — ABNORMAL LOW (ref 3.87–5.11)
RDW: 15.6 % — ABNORMAL HIGH (ref 11.5–15.5)
WBC: 4.9 10*3/uL (ref 4.0–10.5)
nRBC: 0 % (ref 0.0–0.2)

## 2020-02-16 LAB — POCT I-STAT 7, (LYTES, BLD GAS, ICA,H+H)
Acid-Base Excess: 4 mmol/L — ABNORMAL HIGH (ref 0.0–2.0)
Bicarbonate: 27.6 mmol/L (ref 20.0–28.0)
Calcium, Ion: 1.17 mmol/L (ref 1.15–1.40)
HCT: 32 % — ABNORMAL LOW (ref 36.0–46.0)
Hemoglobin: 10.9 g/dL — ABNORMAL LOW (ref 12.0–15.0)
O2 Saturation: 100 %
Potassium: 3.5 mmol/L (ref 3.5–5.1)
Sodium: 140 mmol/L (ref 135–145)
TCO2: 29 mmol/L (ref 22–32)
pCO2 arterial: 38.2 mmHg (ref 32.0–48.0)
pH, Arterial: 7.468 — ABNORMAL HIGH (ref 7.350–7.450)
pO2, Arterial: 157 mmHg — ABNORMAL HIGH (ref 83.0–108.0)

## 2020-02-16 LAB — POCT ACTIVATED CLOTTING TIME
Activated Clotting Time: 257 seconds
Activated Clotting Time: 356 seconds

## 2020-02-16 LAB — MAGNESIUM
Magnesium: 2.1 mg/dL (ref 1.7–2.4)
Magnesium: 2 mg/dL (ref 1.7–2.4)

## 2020-02-16 SURGERY — RIGHT/LEFT HEART CATH AND CORONARY ANGIOGRAPHY
Anesthesia: LOCAL

## 2020-02-16 MED ORDER — ADENOSINE 12 MG/4ML IV SOLN
INTRAVENOUS | Status: AC
  Filled 2020-02-16: qty 16

## 2020-02-16 MED ORDER — HEPARIN (PORCINE) IN NACL 1000-0.9 UT/500ML-% IV SOLN
INTRAVENOUS | Status: DC | PRN
  Administered 2020-02-16 (×2): 500 mL

## 2020-02-16 MED ORDER — SODIUM CHLORIDE 0.9% FLUSH: 3.0000 mL | INTRAVENOUS | Status: DC | PRN

## 2020-02-16 MED ORDER — LIDOCAINE HCL (PF) 1 % IJ SOLN
INTRAMUSCULAR | Status: AC
  Filled 2020-02-16: qty 30

## 2020-02-16 MED ORDER — ADENOSINE (DIAGNOSTIC) 140MCG/KG/MIN
INTRAVENOUS | Status: DC | PRN
  Administered 2020-02-16: 140 ug/kg/min via INTRAVENOUS

## 2020-02-16 MED ORDER — CLOPIDOGREL BISULFATE 75 MG PO TABS
75.0000 mg | ORAL_TABLET | Freq: Every day | ORAL | Status: DC
  Administered 2020-02-17 – 2020-02-18 (×2): 75 mg via ORAL
  Filled 2020-02-16 (×2): qty 1

## 2020-02-16 MED ORDER — LABETALOL HCL 5 MG/ML IV SOLN: 10.0000 mg | INTRAVENOUS | Status: AC | PRN

## 2020-02-16 MED ORDER — SODIUM CHLORIDE 0.9 % IV SOLN: 250.0000 mL | INTRAVENOUS | Status: DC | PRN

## 2020-02-16 MED ORDER — VERAPAMIL HCL 2.5 MG/ML IV SOLN
INTRAVENOUS | Status: AC
  Filled 2020-02-16: qty 2

## 2020-02-16 MED ORDER — POTASSIUM CHLORIDE CRYS ER 20 MEQ PO TBCR: 40.0000 meq | EXTENDED_RELEASE_TABLET | Freq: Once | ORAL | Status: DC

## 2020-02-16 MED ORDER — IOHEXOL 350 MG/ML SOLN
INTRAVENOUS | Status: DC | PRN
  Administered 2020-02-16: 100 mL

## 2020-02-16 MED ORDER — LIDOCAINE HCL (PF) 1 % IJ SOLN
INTRAMUSCULAR | Status: DC | PRN
  Administered 2020-02-16 (×2): 2 mL

## 2020-02-16 MED ORDER — POTASSIUM CHLORIDE CRYS ER 20 MEQ PO TBCR
40.0000 meq | EXTENDED_RELEASE_TABLET | Freq: Once | ORAL | Status: AC
  Administered 2020-02-16: 40 meq via ORAL
  Filled 2020-02-16: qty 2

## 2020-02-16 MED ORDER — CLOPIDOGREL BISULFATE 300 MG PO TABS
ORAL_TABLET | ORAL | Status: AC
  Filled 2020-02-16: qty 1

## 2020-02-16 MED ORDER — HYDRALAZINE HCL 20 MG/ML IJ SOLN: 10.0000 mg | INTRAMUSCULAR | Status: AC | PRN

## 2020-02-16 MED ORDER — HEPARIN (PORCINE) IN NACL 1000-0.9 UT/500ML-% IV SOLN
INTRAVENOUS | Status: AC
  Filled 2020-02-16: qty 1000

## 2020-02-16 MED ORDER — ENOXAPARIN SODIUM 80 MG/0.8ML ~~LOC~~ SOLN
1.0000 mg/kg | Freq: Two times a day (BID) | SUBCUTANEOUS | Status: DC
  Administered 2020-02-16 – 2020-02-17 (×2): 70 mg via SUBCUTANEOUS
  Filled 2020-02-16 (×2): qty 0.8

## 2020-02-16 MED ORDER — SODIUM CHLORIDE 0.9 % IV SOLN: INTRAVENOUS | Status: AC

## 2020-02-16 MED ORDER — HEPARIN SODIUM (PORCINE) 1000 UNIT/ML IJ SOLN
INTRAMUSCULAR | Status: AC
  Filled 2020-02-16: qty 1

## 2020-02-16 MED ORDER — SODIUM CHLORIDE 0.9% FLUSH
3.0000 mL | Freq: Two times a day (BID) | INTRAVENOUS | Status: DC
  Administered 2020-02-17 (×2): 3 mL via INTRAVENOUS

## 2020-02-16 MED ORDER — VERAPAMIL HCL 2.5 MG/ML IV SOLN
INTRAVENOUS | Status: DC | PRN
  Administered 2020-02-16: 10 mL via INTRA_ARTERIAL

## 2020-02-16 MED ORDER — HEPARIN SODIUM (PORCINE) 1000 UNIT/ML IJ SOLN
INTRAMUSCULAR | Status: DC | PRN
  Administered 2020-02-16 (×2): 4000 [IU] via INTRAVENOUS
  Administered 2020-02-16: 3000 [IU] via INTRAVENOUS
  Administered 2020-02-16: 2000 [IU] via INTRAVENOUS

## 2020-02-16 MED ORDER — CLOPIDOGREL BISULFATE 300 MG PO TABS
ORAL_TABLET | ORAL | Status: DC | PRN
  Administered 2020-02-16: 300 mg via ORAL

## 2020-02-16 SURGICAL SUPPLY — 20 items
CATH INFINITI JR4 5F (CATHETERS) ×1 IMPLANT
CATH LAUNCHER 6FR EBU 3 (CATHETERS) ×1 IMPLANT
CATH NAVVUS II (CATHETERS) ×1 IMPLANT
CATH OPTICROSS HD (CATHETERS) ×1 IMPLANT
CATH OPTITORQUE TIG 4.0 5F (CATHETERS) ×1 IMPLANT
CATH SWAN DBL LUMAN 5F 110 (CATHETERS) ×1 IMPLANT
DEVICE RAD TR BAND REGULAR (VASCULAR PRODUCTS) ×1 IMPLANT
GLIDESHEATH SLEND SS 6F .021 (SHEATH) ×1 IMPLANT
GUIDEWIRE INQWIRE 1.5J.035X260 (WIRE) IMPLANT
INQWIRE 1.5J .035X260CM (WIRE) ×2
KIT ESSENTIALS PG (KITS) ×1 IMPLANT
KIT HEART LEFT (KITS) ×2 IMPLANT
PACK CARDIAC CATHETERIZATION (CUSTOM PROCEDURE TRAY) ×2 IMPLANT
SHEATH GLIDE SLENDER 4/5FR (SHEATH) ×1 IMPLANT
SHEATH PROBE COVER 6X72 (BAG) ×1 IMPLANT
SLED PULL BACK IVUS (MISCELLANEOUS) ×1 IMPLANT
TRANSDUCER W/STOPCOCK (MISCELLANEOUS) ×2 IMPLANT
TUBING CIL FLEX 10 FLL-RA (TUBING) ×2 IMPLANT
WIRE ASAHI PROWATER 180CM (WIRE) ×1 IMPLANT
WIRE HI TORQ BMW 190CM (WIRE) ×1 IMPLANT

## 2020-02-16 NOTE — Interval H&P Note (Signed)
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  The various methods of treatment have been discussed with the patient and family. After consideration of risks, benefits and other options for treatment, the patient has consented to  Procedure(s): RIGHT/LEFT HEART CATH AND CORONARY ANGIOGRAPHY (N/A)  PERCUTANEOUS CORONARY INTERVENTION  as a surgical intervention.  The patient's history has been reviewed, patient examined, no change in status, stable for surgery.  I have reviewed the patient's chart and labs.  Questions were answered to the patient's satisfaction.     Cath Lab Visit (complete for each Cath Lab visit)  Clinical Evaluation Leading to the Procedure:   ACS: No.  Non-ACS:    Anginal Classification: No Symptoms; NYHA CLASS III CHF  Anti-ischemic medical therapy: Minimal Therapy (1 class of medications)  Non-Invasive Test Results: High-risk stress test findings: cardiac mortality >3%/year; Severely reduced LVEF on Echo  Prior CABG: No previous CABG    Glenetta Hew

## 2020-02-16 NOTE — Progress Notes (Signed)
Brief oncology note:  I was notified by hospitalist of the patient's admission.  The patient had an echocardiogram performed on 02/15/2020 which showed a left ventricular ejection fraction of 20 to 25%.  She underwent cardiac catheterization at [facility] today which showed nonischemic cardiomyopathy, moderate single-vessel CAD involving the ostial and proximal LCx subacute plaque rupture-DFR and FFR negative (55 to 60% stenosis), otherwise minimal CAD with proximal LAD and RCA 20 to 30% stenosis.  The patient is currently on [place].  I have been asked to comment as to whether she should continue her chemotherapy or if it should be stopped due to her low LVEF.  Discussed with Dr. Julien Nordmann.  Tagrisso can cause cardiomyopathy.  At this time, recommend discontinuation of Tagrisso and we will reevaluate her as an outpatient.  I was unable to see this patient today as she was at [facility] in the cardiac Cath Lab.  We will arrange for outpatient follow-up at the cancer center when she is discharged.  Mikey Bussing, DNP, AGPCNP-BC, AOCNP Mon/Tues/Thurs/Fri 7am-5pm; Off Wednesdays Cell: [phone]

## 2020-02-16 NOTE — Progress Notes (Signed)
Brief cardiology note: Cath results reviewed, findings consistent with nonischemic cardiomyopathy. Limited by blood pressure in terms of goal directed medical therapy. May be related to chemotherapy, per onc note today Valerie [name] is now stopped.  Will follow up in AM.  Valerie Dresser, MD, PhD [facility]  [address] [place], [place] [address] [phone]

## 2020-02-16 NOTE — Progress Notes (Signed)
TR Band Placement- right  Amount of air in band- 0  Time air was removed from band-1500  Dressing placed- Gauze and Tegaderm  Level prior to band removal- 0  Level after removal-  0  Post Education- Yes   Comments-

## 2020-02-16 NOTE — Progress Notes (Signed)
Received pt from [facility] via [place] alert and oriented X4, skin warm and dry with no complaints of any discomfort or chest pain at this time.  IVF NS infusing, consent signed , and  patient placed on monitor. Pt waiting for cath procedure and to speak to cardiologist.  Call bed in reach.

## 2020-02-16 NOTE — Progress Notes (Signed)
PROGRESS NOTE    Valerie [name]  QPY:[id number] DOB: 09-Dec-1945 DOA: 02/14/2020 PCP: [name], MD   Brief Narrative:  74 year old with history of metastatic non-small cell lung cancer, adenocarcinoma with brain metastases not on home oxygen, lobectomy, craniotomy, radiation, thyroidectomy, on oral chemo, dementia, anemia of chronic disease comes to the hospital with shortness of breath.  Admitted with concerns of new onset A. fib, CHF exacerbation and UTI.  Found to have new onset cardiomyopathy with EF of 20-25%, underwent left heart and right heart catheterization on 6/29 showing minimal coronary artery disease, 50% LCx.  Plans to manage it medically.  Oncology team notified.   Assessment & Plan:   Principal Problem:   Congestive heart failure (CHF) (HCC) Active Problems:   Malignant neoplasm of brain (HCC)   Lung metastasis (HCC)   Dementia (HCC)   History of DVT of lower extremity   Acute combined systolic and diastolic heart failure (HCC)   Elevated troponin   Dilated cardiomyopathy (HCC)   Coronary artery disease involving native coronary artery of native heart without angina pectoris  Dyspnea on exertion with left-sided pleural effusion Acute congestive heart failure with reduced ejection fraction, EF 20%, class III -Unclear if this effusion is related to malignancy versus CHF -Echocardiogram 6/28-EF 12%, grade 1 diastolic dysfunction.  Elevated PASP -Continue 40 mg IV Lasix twice daily until euvolemic thereafter transition to p.o. eventually, fluid restriction, check daily electrolytes -Cardiology team following-medical management per their service. -TSH normal  -LDL 155 -Toprol-XL 12.5 mg daily  Sinus tachycardia with new left bundle branch block Prolonged QTC with hypokalemia -Rate controlled.  Intermittent.  EKG no acute ACS noted. likely demand ischemia.  Trend troponin -Repeat EKG -Aggressively replete electrolytes -Currently on Lovenox, eventually will  go back on Xarelto  Hyperlipidemia -LDL 155.  Lipitor daily started  Dirty UA, urinary tract infection, symptomatic -Urine cultures-still pending, previously grown E. coli which was pansensitive -She is having burning with urination therefore we will continue empiric IV Rocephin  Anemia of chronic disease -Hemoglobin at baseline around 11.0.  History of metastatic non-small cell lung cancer with brain mets -Currently on Tagrisso, oncology team made aware especially about reduced EF/cardiomyopathy.  Further management per their service. Chemo will likely need to be held.   DVT prophylaxis: Lovenox 1 mg/kg every 12 hours Code Status: Full code Family Communication: None at bedside  Status is: Inpatient  Remains inpatient appropriate because:IV treatments appropriate due to intensity of illness or inability to take PO   Dispo: The patient is from: Home              Anticipated d/c is to: Home              Anticipated d/c date is: 1-2 days              Patient currently is not medically stable to d/c.  Patient went to [facility] for left heart catheterization, will remain in the hospital tonight for cardiac management per cardiology service.  Hopefully discharge home in next 1-2 days.  In the meantime we will also have to determine her ongoing cancer care.  Body mass index is 24.03 kg/m.  Pressure Injury 09/17/19 Heel Deep Tissue Pressure Injury - Purple or maroon localized area of discolored intact skin or blood-filled blister due to damage of underlying soft tissue from pressure and/or shear. (Active)  09/17/19 0948  Location: Heel  Location Orientation:   Staging: Deep Tissue Pressure Injury - Purple or maroon  localized area of discolored intact skin or blood-filled blister due to damage of underlying soft tissue from pressure and/or shear.  Wound Description (Comments):   Present on Admission: Yes      Subjective: Checked her room multiple times, she is still at  [facility] post cath awaiting to be picked up, no known time when she will be back.  If I am unable to see her, I will call her room later on in the evening. I spoke with Cards PA and Onc NP about her care of plan.   Review of Systems Otherwise negative except as per HPI, including: Unable to obtain  Examination: Unable to perform. Objective: Vitals:   02/16/20 1106 02/16/20 1111 02/16/20 1116 02/16/20 1121  BP: 128/69 116/65 (!) 113/58 (!) 114/52  Pulse: 90 99 88 96  Resp: (!) 22 (!) 28 20 (!) 9  Temp:      TempSrc:      SpO2: 100% 100% 95% 95%  Weight:      Height:        Intake/Output Summary (Last 24 hours) at 02/16/2020 1143 Last data filed at 02/16/2020 0700 Gross per 24 hour  Intake 340 ml  Output 300 ml  Net 40 ml   Filed Weights   02/15/20 0049 02/15/20 0126 02/16/20 0500  Weight: 69.6 kg 69.6 kg 69.6 kg     Data Reviewed:   CBC: Recent Labs  Lab 02/14/20 1746 02/15/20 0551 02/16/20 0513  WBC 8.3 6.2 4.9  NEUTROABS 7.0  --   --   HGB 10.3* 10.8* 10.3*  HCT 33.6* 33.7* 32.7*  MCV 93.1 91.8 91.3  PLT 208 224 737   Basic Metabolic Panel: Recent Labs  Lab 02/14/20 1746 02/14/20 1837 02/15/20 0551 02/16/20 0513  NA  --  140 136 139  K  --  3.3* 2.9* 3.3*  CL  --  107 100 102  CO2  --  25 27 27   GLUCOSE  --  110* 107* 98  BUN  --  11 11 14   CREATININE  --  0.68 0.85 0.90  CALCIUM  --  8.7* 8.8* 9.1  MG 1.9  --   --  2.1   GFR: Estimated Creatinine Clearance: 53.3 mL/min (by C-G formula based on SCr of 0.9 mg/dL). Liver Function Tests: Recent Labs  Lab 02/14/20 1837  AST 17  ALT 10  ALKPHOS 55  BILITOT 0.9  PROT 5.9*  ALBUMIN 3.2*   Recent Labs  Lab 02/14/20 1837  LIPASE 25   No results for input(s): AMMONIA in the last 168 hours. Coagulation Profile: No results for input(s): INR, PROTIME in the last 168 hours. Cardiac Enzymes: No results for input(s): CKTOTAL, CKMB, CKMBINDEX, TROPONINI in the last 168 hours. BNP (last 3 results) No  results for input(s): PROBNP in the last 8760 hours. HbA1C: No results for input(s): HGBA1C in the last 72 hours. CBG: No results for input(s): GLUCAP in the last 168 hours. Lipid Profile: Recent Labs    02/15/20 0550  CHOL 242*  HDL 68  LDLCALC 155*  TRIG 97  CHOLHDL 3.6   Thyroid Function Tests: Recent Labs    02/15/20 0550  TSH 2.095   Anemia Panel: No results for input(s): VITAMINB12, FOLATE, FERRITIN, TIBC, IRON, RETICCTPCT in the last 72 hours. Sepsis Labs: No results for input(s): PROCALCITON, LATICACIDVEN in the last 168 hours.  Recent Results (from the past 240 hour(s))  SARS Coronavirus 2 by RT PCR (hospital order, performed in [facility] hospital  lab) Nasopharyngeal Nasopharyngeal Swab     Status: None   Collection Time: 02/14/20  5:46 PM   Specimen: Nasopharyngeal Swab  Result Value Ref Range Status   SARS Coronavirus 2 NEGATIVE NEGATIVE Final    Comment: (NOTE) SARS-CoV-2 target nucleic acids are NOT DETECTED.  The SARS-CoV-2 RNA is generally detectable in upper and lower respiratory specimens during the acute phase of infection. The lowest concentration of SARS-CoV-2 viral copies this assay can detect is 250 copies / mL. A negative result does not preclude SARS-CoV-2 infection and should not be used as the sole basis for treatment or other patient management decisions.  A negative result may occur with improper specimen collection / handling, submission of specimen other than nasopharyngeal swab, presence of viral mutation(s) within the areas targeted by this assay, and inadequate number of viral copies (<250 copies / mL). A negative result must be combined with clinical observations, patient history, and epidemiological information.  Fact Sheet for Patients:   StrictlyIdeas.no  Fact Sheet for Healthcare Providers: BankingDealers.co.za  This test is not yet approved or  cleared by the [place] FDA  and has been authorized for detection and/or diagnosis of SARS-CoV-2 by FDA under an Emergency Use Authorization (EUA).  This EUA will remain in effect (meaning this test can be used) for the duration of the COVID-19 declaration under Section 564(b)(1) of the Act, 21 U.S.C. section 360bbb-3(b)(1), unless the authorization is terminated or revoked sooner.  Performed at [facility], [place] [address], [place], [place] [address]          Radiology Studies: DG Chest 2 View  Result Date: 02/14/2020 CLINICAL DATA:  Shortness of breath.  Lung cancer. EXAM: CHEST - 2 VIEW COMPARISON:  06/16/2019 FINDINGS: Cardiomegaly, vascular congestion. Small left pleural effusion with left lower lobe atelectasis or infiltrate. Postoperative changes on the left. No acute bony abnormality. IMPRESSION: Cardiomegaly with vascular congestion. Small left pleural effusion with left base atelectasis or infiltrate. Electronically Signed   By: Rolm Baptise M.D.   On: 02/14/2020 17:49   ECHOCARDIOGRAM COMPLETE  Result Date: 02/15/2020    ECHOCARDIOGRAM REPORT   Patient Name:   Valerie [name] [facility] Date of Exam: 02/15/2020 Medical Rec #:  [id number]           Height:       67.0 in Accession #:    [id number]          Weight:       153.4 lb Date of Birth:  September 29, 1945          BSA:          1.807 m Patient Age:    34 years            BP:           109/61 mmHg Patient Gender: F                   HR:           82 bpm. Exam Location:  Inpatient Procedure: 2D Echo, Cardiac Doppler and Color Doppler Indications:    CHF  History:        Patient has no prior history of Echocardiogram examinations.                 CHF, Signs/Symptoms:Dyspnea, dementia and Murmur; Risk                 Factors:Hypertension. Lung cancer.  Sonographer:    Dustin Flock Referring  Phys: [id number] ALEXIS HUGELMEYER IMPRESSIONS  1. Left ventricular ejection fraction, by estimation, is 20 to 25%. The left ventricle has severely decreased  function. The left ventricle demonstrates global hypokinesis. Left ventricular diastolic parameters are consistent with Grade I diastolic dysfunction (impaired relaxation).  2. Right ventricular systolic function is normal. The right ventricular size is normal. There is mildly elevated pulmonary artery systolic pressure.  3. The mitral valve is abnormal. Mild to moderate mitral valve regurgitation.  4. The aortic valve is grossly normal. Aortic valve regurgitation is mild.  5. The inferior vena cava is dilated in size with >50% respiratory variability, suggesting right atrial pressure of 8 mmHg. FINDINGS  Left Ventricle: Left ventricular ejection fraction, by estimation, is 20 to 25%. The left ventricle has severely decreased function. The left ventricle demonstrates global hypokinesis. The left ventricular internal cavity size was normal in size. There is no left ventricular hypertrophy. Left ventricular diastolic parameters are consistent with Grade I diastolic dysfunction (impaired relaxation). Right Ventricle: The right ventricular size is normal. No increase in right ventricular wall thickness. Right ventricular systolic function is normal. There is mildly elevated pulmonary artery systolic pressure. The tricuspid regurgitant velocity is 2.95  m/s, and with an assumed right atrial pressure of 3 mmHg, the estimated right ventricular systolic pressure is 73.4 mmHg. Left Atrium: Left atrial size was normal in size. Right Atrium: Right atrial size was normal in size. Pericardium: Trivial pericardial effusion is present. Mitral Valve: The mitral valve is abnormal. There is mild thickening of the mitral valve leaflet(s). Mild to moderate mitral valve regurgitation. Tricuspid Valve: The tricuspid valve is normal in structure. Tricuspid valve regurgitation is mild. Aortic Valve: The aortic valve is grossly normal. Aortic valve regurgitation is mild. Aortic regurgitation PHT measures 578 msec. Pulmonic Valve: The  pulmonic valve was not well visualized. Pulmonic valve regurgitation is not visualized. Aorta: The aortic root is normal in size and structure. Venous: The inferior vena cava is dilated in size with greater than 50% respiratory variability, suggesting right atrial pressure of 8 mmHg. IAS/Shunts: No atrial level shunt detected by color flow Doppler.  LEFT VENTRICLE PLAX 2D LVIDd:         5.50 cm  Diastology LVIDs:         4.70 cm  LV e' lateral:   4.35 cm/s LV PW:         0.90 cm  LV E/e' lateral: 12.4 LV IVS:        0.80 cm  LV e' medial:    3.81 cm/s LVOT diam:     1.80 cm  LV E/e' medial:  14.1 LV SV:         43 LV SV Index:   24 LVOT Area:     2.54 cm  RIGHT VENTRICLE RV Basal diam:  2.70 cm RV S prime:     11.60 cm/s TAPSE (M-mode): 2.5 cm LEFT ATRIUM           Index       RIGHT ATRIUM           Index LA diam:      3.20 cm 1.77 cm/m  RA Area:     16.70 cm LA Vol (A2C): 45.4 ml 25.13 ml/m RA Volume:   46.90 ml  25.96 ml/m LA Vol (A4C): 67.6 ml 37.41 ml/m  AORTIC VALVE LVOT Vmax:   120.00 cm/s LVOT Vmean:  77.200 cm/s LVOT VTI:    0.170 m AI PHT:  578 msec  AORTA Ao Root diam: 3.20 cm MITRAL VALVE                TRICUSPID VALVE MV Area (PHT): 4.60 cm     TR Peak grad:   34.8 mmHg MV Decel Time: 165 msec     TR Vmax:        295.00 cm/s MV E velocity: 53.90 cm/s MV A velocity: 100.00 cm/s  SHUNTS MV E/A ratio:  0.54         Systemic VTI:  0.17 m                             Systemic Diam: 1.80 cm Dorris Carnes MD Electronically signed by Dorris Carnes MD Signature Date/Time: 02/15/2020/1:19:27 PM    Final         Scheduled Meds: . [MAR Hold] aspirin EC  81 mg Oral Daily  . [MAR Hold] atorvastatin  80 mg Oral QHS  . [MAR Hold] furosemide  40 mg Intravenous BID  . [MAR Hold] metoprolol succinate  12.5 mg Oral Daily  . [MAR Hold] sodium chloride flush  3 mL Intravenous Q12H  . [MAR Hold] sodium chloride flush  3 mL Intravenous Q12H   Continuous Infusions: . [MAR Hold] sodium chloride    . sodium  chloride    . sodium chloride 10 mL/hr at 02/16/20 0634  . [MAR Hold] cefTRIAXone (ROCEPHIN)  IV 1 g (02/15/20 1300)     LOS: 2 days   Time spent= 35 mins    Aundra Pung Arsenio Loader, MD [facility]  If 7PM-7AM, please contact night-coverage  02/16/2020, 11:43 AM

## 2020-02-16 NOTE — Progress Notes (Signed)
ANTICOAGULATION CONSULT NOTE - Follow Up Consult  Pharmacy Consult for Enoxaparin Indication: new onset afib, hx DVT, elevated troponin  Allergies  Allergen Reactions  . Keflex [Cephalexin]     Patient Measurements: Height: 5\' 7"  (170.2 cm) Weight: 69.6 kg (153 lb 7 oz) IBW/kg (Calculated) : 61.6  Vital Signs: Temp: 98 F (36.7 C) (06/29 0750) Temp Source: Oral (06/29 0750) BP: 114/50 (06/29 1415) Pulse Rate: 89 (06/29 1415)  Labs: Recent Labs    02/14/20 1746 02/14/20 1746 02/14/20 1837 02/15/20 0551 02/15/20 0758 02/15/20 0920 02/16/20 0513  HGB 10.3*   < >  --  10.8*  --   --  10.3*  HCT 33.6*  --   --  33.7*  --   --  32.7*  PLT 208  --   --  224  --   --  242  CREATININE  --   --  0.68 0.85  --   --  0.90  TROPONINIHS  --   --  111*  --  109* 106*  --    < > = values in this interval not displayed.    Estimated Creatinine Clearance: 53.3 mL/min (by C-G formula based on SCr of 0.9 mg/dL).   Medical History: Past Medical History:  Diagnosis Date  . Abnormal Pap smear 2006  . Anal fistula   . Ankle fracture   . Anxiety   . Arthritis   . Atrophic vaginitis 2008  . Cataract   . Dementia ([place]) 2009  . Dyspareunia 2008  . H/O osteoporosis   . H/O varicella   . H/O vitamin D deficiency   . Headache 07/24/2016  . Heart murmur   . History of measles, mumps, or rubella   . History of radiation therapy 07/28/13- 08/10/13   right lung metastasis 5000 cGy 10 sessions  . Hypertension   . Lung cancer ([place]) dx'd 2002  . Lung cancer ([place])   . Lung cancer ([place])   . Lung metastasis ([place])    PET scan 05/05/13, RUL lung nodule  . Metastasis to brain [facility]) dx'd 2008  . 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 Nodule of right lung CT- 06/03/12   RIGHT UPPER LOBE  . Nodule of right lung 06/03/12   Upper Lobe  . On antineoplastic chemotherapy    TARCEVA  . Osteoporosis 2010  . Primary cancer of right upper lobe of lung ([place]) 04/22/2009   Qualifier: Diagnosis  of  By: Nils Pyle CMA ([place]), Mearl Latin    . Shortness of breath    hx lung ca   . Status post chemotherapy 2003   CARBOPLATIN/PACLITAXEL /STATUS POST CLINICAL TRAIL OF CELEBREX AND IRESSA AT [facility] FOR 1 YEAR  . Status post radiation therapy 2003   LEFT LUNG  . Status post radiation therapy 11/07/2005   WHOLE BRAIN: DR Larkin In[facility]  . Status post radiation therapy 06/02/2008   GAMMA KNIFE OF RESECTED CAVITAY  . Thyroid adenoma    ?  [name] Thyroid cancer ([place]) 10/18/11 bx   adenoid nodules   . Yeast infection     Medications: Rivaroxaban 20 mg PO daily PTA Patient reported last dose of rivaroxaban: 6/27 ~11:00 AM  Assessment: Pt is a 7 yoF with PMH significant for metastatic NSCLC. Anticoagulation being transitioned from rivaroxaban to therapeutic LMWH. -CBC: Hgb (10.3) slightly low; Plt (208) WNL -SCr 0.7, CrCl ~60 mL/min  02/15/20: Enoxaparin held after PM dose for right/left hear cath in AM  02/16/20: right/left heart cath -  resume LMWH 8hr after sheath removed (11:20)  Plan:   At 20:30 tonight, resume Enoxaparin 1 mg/kg subQ q12h  Follow CBC, renal function  Peggyann Juba, PharmD, BCPS Pharmacy: [phone] 02/16/2020,4:20 PM

## 2020-02-17 ENCOUNTER — Telehealth: Payer: Self-pay | Admitting: Physician Assistant

## 2020-02-17 ENCOUNTER — Encounter (HOSPITAL_COMMUNITY): Payer: Self-pay | Admitting: Cardiology

## 2020-02-17 DIAGNOSIS — I428 Other cardiomyopathies: Secondary | ICD-10-CM

## 2020-02-17 DIAGNOSIS — Z86718 Personal history of other venous thrombosis and embolism: Secondary | ICD-10-CM

## 2020-02-17 LAB — MAGNESIUM
Magnesium: 2 mg/dL (ref 1.7–2.4)
Magnesium: 2.2 mg/dL (ref 1.7–2.4)

## 2020-02-17 LAB — CBC
HCT: 35.7 % — ABNORMAL LOW (ref 36.0–46.0)
Hemoglobin: 11.5 g/dL — ABNORMAL LOW (ref 12.0–15.0)
MCH: 29.2 pg (ref 26.0–34.0)
MCHC: 32.2 g/dL (ref 30.0–36.0)
MCV: 90.6 fL (ref 80.0–100.0)
Platelets: 292 10*3/uL (ref 150–400)
RBC: 3.94 MIL/uL (ref 3.87–5.11)
RDW: 15.6 % — ABNORMAL HIGH (ref 11.5–15.5)
WBC: 5.2 10*3/uL (ref 4.0–10.5)
nRBC: 0 % (ref 0.0–0.2)

## 2020-02-17 LAB — BASIC METABOLIC PANEL
Anion gap: 11 (ref 5–15)
BUN: 13 mg/dL (ref 8–23)
CO2: 29 mmol/L (ref 22–32)
Calcium: 9.4 mg/dL (ref 8.9–10.3)
Chloride: 100 mmol/L (ref 98–111)
Creatinine, Ser: 0.99 mg/dL (ref 0.44–1.00)
GFR calc Af Amer: >60 mL/min (ref >60)
GFR calc non Af Amer: 56 mL/min — ABNORMAL LOW (ref >60)
Glucose, Bld: 98 mg/dL (ref 70–99)
Potassium: 3.5 mmol/L (ref 3.5–5.1)
Sodium: 140 mmol/L (ref 135–145)

## 2020-02-17 LAB — URINE CULTURE: Culture: >=100000 — AB

## 2020-02-17 MED ORDER — AMMONIUM LACTATE 5 % EX LOTN
TOPICAL_LOTION | Freq: Two times a day (BID) | CUTANEOUS | Status: DC
  Filled 2020-02-17: qty 222

## 2020-02-17 MED ORDER — AMMONIUM LACTATE 12 % EX LOTN
TOPICAL_LOTION | Freq: Two times a day (BID) | CUTANEOUS | Status: DC
  Filled 2020-02-17: qty 225

## 2020-02-17 MED ORDER — CEFDINIR 300 MG PO CAPS
300.0000 mg | ORAL_CAPSULE | Freq: Two times a day (BID) | ORAL | Status: DC
  Administered 2020-02-18: 300 mg via ORAL
  Filled 2020-02-17: qty 1

## 2020-02-17 MED ORDER — POTASSIUM CHLORIDE CRYS ER 20 MEQ PO TBCR
40.0000 meq | EXTENDED_RELEASE_TABLET | Freq: Once | ORAL | Status: AC
  Administered 2020-02-17: 40 meq via ORAL
  Filled 2020-02-17: qty 2

## 2020-02-17 MED ORDER — CEPHALEXIN 500 MG PO CAPS: 500.0000 mg | ORAL_CAPSULE | Freq: Two times a day (BID) | ORAL | Status: DC

## 2020-02-17 MED ORDER — RIVAROXABAN 20 MG PO TABS
20.0000 mg | ORAL_TABLET | Freq: Every day | ORAL | Status: DC
  Administered 2020-02-17: 20 mg via ORAL

## 2020-02-17 MED ORDER — MAGNESIUM OXIDE 400 (241.3 MG) MG PO TABS
400.0000 mg | ORAL_TABLET | Freq: Two times a day (BID) | ORAL | Status: DC
  Administered 2020-02-17 – 2020-02-18 (×3): 400 mg via ORAL
  Filled 2020-02-17 (×3): qty 1

## 2020-02-17 MED ORDER — AMMONIUM LACTATE 12 % EX LOTN
TOPICAL_LOTION | Freq: Two times a day (BID) | CUTANEOUS | Status: DC
  Administered 2020-02-18: 1 via TOPICAL
  Filled 2020-02-17: qty 225

## 2020-02-17 NOTE — Progress Notes (Signed)
Progress Note  Patient Name: Valerie [name] Date of Encounter: 02/17/2020  Primary Cardiologist: Buford Dresser, MD   Subjective   Breathing is back to baseline. She was reassured by heart cath findings. No complaints of chest pain or palpitations. No LE edema. Reiterated importance of limiting salt intake including picking up foods from restaurants.   Inpatient Medications    Scheduled Meds: . aspirin EC  81 mg Oral Daily  . atorvastatin  80 mg Oral QHS  . clopidogrel  75 mg Oral Q breakfast  . enoxaparin (LOVENOX) injection  1 mg/kg Subcutaneous Q12H  . magnesium oxide  400 mg Oral BID  . metoprolol succinate  12.5 mg Oral Daily  . potassium chloride  40 mEq Oral Once  . sodium chloride flush  3 mL Intravenous Q12H  . sodium chloride flush  3 mL Intravenous Q12H  . sodium chloride flush  3 mL Intravenous Q12H   Continuous Infusions: . sodium chloride    . sodium chloride    . cefTRIAXone (ROCEPHIN)  IV 1 g (02/15/20 1300)   PRN Meds: sodium chloride, sodium chloride, acetaminophen, ipratropium-albuterol, ondansetron (ZOFRAN) IV, polyethylene glycol, senna-docusate, sodium chloride flush, sodium chloride flush   Vital Signs    Vitals:   02/16/20 1859 02/16/20 2109 02/17/20 0548 02/17/20 1058  BP: 102/65 96/66 94/79  (!) 107/58  Pulse: 97 83 82 93  Resp:  20 17 16   Temp: 97.7 F (36.5 C) 98.6 F (37 C) 97.7 F (36.5 C)   TempSrc: Oral Oral Oral   SpO2: 100% 98% 95% 99%  Weight:      Height:        Intake/Output Summary (Last 24 hours) at 02/17/2020 1247 Last data filed at 02/17/2020 1020 Gross per 24 hour  Intake 195 ml  Output 1775 ml  Net -1580 ml   Filed Weights   02/15/20 0049 02/15/20 0126 02/16/20 0500  Weight: 69.6 kg 69.6 kg 69.6 kg    Telemetry    Sinus rhythm with intermittent LBBB and episodes of NSVT sometimes 12 beats at a time. - Personally Reviewed  ECG    No new tracings - Personally Reviewed  Physical Exam   GEN: No  acute distress.   Neck: No JVD, no carotid bruits Cardiac: RRR, no murmurs, rubs, or gallops.  Respiratory: Clear to auscultation bilaterally, no wheezes/ rales/ rhonchi GI: NABS, Soft, nontender, non-distended  MS: No edema; No deformity. Neuro:  Nonfocal, moving upper extremities spontaneously Psych: Normal affect   Labs    Chemistry Recent Labs  Lab 02/14/20 1837 02/15/20 0551 02/16/20 0513 02/16/20 1001 02/16/20 1006 02/16/20 2308 02/17/20 0635  NA 140   < > 139   < > 139 139 140  K 3.3*   < > 3.3*   < > 3.6 3.8 3.5  CL 107   < > 102  --   --  102 100  CO2 25   < > 27  --   --  27 29  GLUCOSE 110*   < > 98  --   --  122* 98  BUN 11   < > 14  --   --  13 13  CREATININE 0.68   < > 0.90  --   --  0.93 0.99  CALCIUM 8.7*   < > 9.1  --   --  9.0 9.4  PROT 5.9*  --   --   --   --   --   --  ALBUMIN 3.2*  --   --   --   --   --   --   AST 17  --   --   --   --   --   --   ALT 10  --   --   --   --   --   --   ALKPHOS 55  --   --   --   --   --   --   BILITOT 0.9  --   --   --   --   --   --   GFRNONAA >60   < > >60  --   --  >60 56*  GFRAA >60   < > >60  --   --  >60 >60  ANIONGAP 8   < > 10  --   --  10 11   < > = values in this interval not displayed.     Hematology Recent Labs  Lab 02/15/20 0551 02/15/20 0551 02/16/20 0513 02/16/20 1001 02/16/20 1005 02/16/20 1006 02/17/20 0635  WBC 6.2  --  4.9  --   --   --  5.2  RBC 3.67*  --  3.58*  --   --   --  3.94  HGB 10.8*   < > 10.3*   < > 11.2* 11.2* 11.5*  HCT 33.7*   < > 32.7*   < > 33.0* 33.0* 35.7*  MCV 91.8  --  91.3  --   --   --  90.6  MCH 29.4  --  28.8  --   --   --  29.2  MCHC 32.0  --  31.5  --   --   --  32.2  RDW 15.3  --  15.6*  --   --   --  15.6*  PLT 224  --  242  --   --   --  292   < > = values in this interval not displayed.    Cardiac EnzymesNo results for input(s): TROPONINI in the last 168 hours. No results for input(s): TROPIPOC in the last 168 hours.   BNP Recent Labs  Lab  02/14/20 1746  BNP 985.5*     DDimer  Recent Labs  Lab 02/15/20 0550  DDIMER 0.99*     Radiology    CARDIAC CATHETERIZATION  Result Date: 02/16/2020  Hemodynamic findings consistent with mild (borderline) pulmonary hypertension.  LV end diastolic pressure is normal.  Ost Cx to Prox Cx lesion is 55% stenosed. -> Somewhat ulcerated mixed plaque on IVUS-estimated 50-60%, not flow-limiting by FFR (0.98),  Ost LAD to Prox LAD lesion is 30% stenosed.  Prox RCA lesion is 20% stenosed.  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  Right heart cath pressures show PCWP 18 mmHg with V wave of 24 mmHg from MR, LVEDP 14 mmHg.  PA diastolic 26 mm;  Cardiac Output-Index: 6.88-3.8 Glenetta Hew, MD   Cardiac Studies   Echocardiogram 02/15/20: 1. Left ventricular ejection fraction, by estimation, is 20 to 25%. The  left ventricle has severely decreased function. The left ventricle  demonstrates global hypokinesis. Left ventricular diastolic parameters are  consistent with Grade I diastolic  dysfunction (impaired relaxation).  2. Right ventricular  systolic function is normal. The right ventricular  size is normal. There is mildly elevated pulmonary artery systolic  pressure.  3. The mitral valve is abnormal. Mild to moderate mitral valve  regurgitation.  4. The aortic valve is grossly normal. Aortic valve regurgitation is  mild.  5. The inferior vena cava is dilated in size with >50% respiratory  variability, suggesting right atrial pressure of 8 mmHg.   Right/left heart catheterization 02/16/20:  Hemodynamic findings consistent with mild (borderline) pulmonary hypertension.  LV end diastolic pressure is  normal.  Ost Cx to Prox Cx lesion is 55% stenosed. -> Somewhat ulcerated mixed plaque on IVUS-estimated 50-60%, not flow-limiting by FFR (0.98),  Ost LAD to Prox LAD lesion is 30% stenosed.  Prox RCA lesion is 20% stenosed.  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  Right heart cath pressures show PCWP 18 mmHg with V wave of 24 mmHg from MR, LVEDP 14 mmHg.  PA diastolic 26 mm;  Cardiac Output-Index: 6.88-3.8   Patient Profile     74 y.o. female with a PMH of metastatic NSCLC (diagnosed 7 yr ago, on oral chemo x10 years), history of DVT on anticoagulation, bedbound since 05/2019 following a LLE fracture, who is being followed by cardiology for acute combined CHF.   Assessment & Plan    1. Acute combined CHF/non-ischemic cardiomyopathy: patient presented with progressive DOE. CXR with mild cardiomegaly and vascular congestion. BNP elevated to 900s. Echo revealed EF 20-25%, G1DD, global hypokinesis, and no significant valvular abnormalities. She was given IV lasix 40mg  BID with improvement in symptoms. She underwent a R/LHC which revealed moderate CAD involving ostial-pLCx with subacute plaque rupture with negative DFR/FFR (55-60% stenosis), and 20-30% pLAD and pRCA stenosis. RHC suggested adequate diuresis. She was started on metoprolol succinate 12.5mg  daily, though unable to add ACEi/ARB/ARNI due to soft blood pressures. 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  - Will hold lasix at this time. She can contact the office if she notices SOB or LE edema prior to her follow-up visit  - Continue metoprolol  succinate 12.5mg  daily - Can consider adding ACEi/ARB/ARNI outpatient if BP improves  - Continue to monitor strict I&Os and daily weights - Anticipate repeat echo in 3 months to monitor for improvement in LV function  2. New LBBB: EKG this admission with sinus rhythm with new LBBB compared to 05/2019. No evidence of atrial fibrillation. LHC findings as above.   3. NSVT: brief episodes on telemetry - she is asymptomatic. Likely 2/2 hypokalemia.  - Will send home on potassium 20 mEq daily with plans for repeat BMET at follow-up.  4. HLD: LDL 155 this admission; goal <70. Started on atorvastatin 80mg  daily - Continue atorvastatin - Will need repeat FLP/LFTs in 6-8 weeks for close monitoring.    5. Hypokalemia: K 3.5 today.  - Will replete with 40 mEq today - Recommend potassium 20 mEq daily at discharge - Continue to monitor closely and replete as needed to maintain K >4  6. History of DVT: home xarelto held. On lovenox at this time.  - Okay to resume xarelto at this time.    Will arrange outpatient follow-up in our office.   For  questions or updates, please contact [place] Please consult [email or web address] for contact info under Cardiology/STEMI.      Signed, Abigail Butts, PA-C  02/17/2020, 12:47 PM   [phone]

## 2020-02-17 NOTE — Progress Notes (Signed)
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   Admitted 02/14/2020  new onset A. fib, CHF exacerbation and UTI.  Echo = new onset cardiomyopathy with EF of 20-25%, underwent left heart and right heart catheterization on 6/29 showing minimal coronary artery disease, 50% LCx.   Plans to manage it medically.  Oncology team notified.   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  Elevated PASP -Initially on Lasix -Cardiology recommends metoprolol succinate 12.5 daily -TSH normal  -LDL 155  Sinus tachycardia + V. tach with new left bundle branch block Prolonged QTC with hypokalemia -Intermittent V. tach but still above 12-15 beats -Repeat EKG -Magnesium 2.0 increasing dosing to Mag-Ox 400 twice daily -Placed on Xarelto  Hyperlipidemia -LDL 155.  Lipitor daily started  Dirty UA, urinary tract infection, symptomatic -Urine cultures-Citrobacter  in addition to Klebsiella -Transition to oral Keflex which seems to cover both  Anemia of chronic disease -Hemoglobin at baseline around 11.0.  History of metastatic non-small cell lung cancer with brain mets -PTA med Tagrisso, oncology team made aware especially about reduced EF/cardiomyopathy.   -Chemo will be reconsidered per them in the outpatient setting  Puncture wound Sacral decubiti? I do not visualize an open wound rather she has hyperpigmentation likely from the chronic steroids that her husband has been using She will be placed on Lac-Hydrin or Aveeno to help moisturize the area and she needs to be turned every 2 hours It will be a challenge to prevent her from developing further breakdown  DVT prophylaxis: Transitioned back to Xarelto A. fib dosing Code Status: Full code Family Communication: None at bedside  Status is: Inpatient  Remains inpatient appropriate because:IV treatments appropriate due to intensity of illness or inability to take PO   Dispo: The patient is from: Home              Anticipated d/c is to: Home              Anticipated d/c date is: 1-2 days              Patient currently is not medically stable to d/c.  Patient went to [facility] for left heart catheterization, will remain in the hospital tonight for cardiac management per cardiology service.  Hopefully discharge home in next 1-2 days.  In the meantime we will also have to determine her ongoing cancer care.  Body mass index is 24.03 kg/m.  Pressure Injury 09/17/19 Heel Deep Tissue Pressure Injury - Purple or maroon localized area of discolored intact  skin or blood-filled blister due to damage of underlying soft tissue from pressure and/or shear. (Active)  09/17/19 0948  Location: Heel  Location Orientation:   Staging: Deep Tissue Pressure Injury - Purple or maroon localized area of discolored intact skin or blood-filled blister due to damage of underlying soft tissue from pressure  and/or shear.  Wound Description (Comments):   Present on Admission: Yes      Subjective: Doing fair Somewhat winded but otherwise feels well Quite conversant able to tell me that her breathing is better Does not seem confused No chest pain no fever no chills Tells me she has a Engineer, drilling lift at home and essentially does not walk Also tells me that her husband has chronically been putting hydrocortisone on areas of her sacrum for a while   Review of Systems Otherwise negative except as per HPI, including: Unable to obtain  Examination: Awake coherent no distress frail cachectic appearing bitemporal and supraclavicular wasting No thrush No JVD Holosystolic murmur 4/6 best heard LUSE On monitor she had up to 30 beats of V. tach early this morning and then at 10 AM had 16 beats Abdomen is soft no rebound scaphoid No lower extremity edema On her sacrum she has hyperpigmentation over the entire right side of her buttocks There is some scale Neurologically intact moving all 4 limbs Objective: Vitals:   02/16/20 2109 02/17/20 0548 02/17/20 1058 02/17/20 1407  BP: 96/66 94/79 (!) 107/58 (!) 99/55  Pulse: 83 82 93 86  Resp: 20 17 16 20   Temp: 98.6 F (37 C) 97.7 F (36.5 C)  98.2 F (36.8 C)  TempSrc: Oral Oral  Oral  SpO2: 98% 95% 99% 100%  Weight:      Height:        Intake/Output Summary (Last 24 hours) at 02/17/2020 1514 Last data filed at 02/17/2020 1403 Gross per 24 hour  Intake 315 ml  Output 2125 ml  Net -1810 ml   Filed Weights   02/15/20 0049 02/15/20 0126 02/16/20 0500  Weight: 69.6 kg 69.6 kg 69.6 kg     Data Reviewed:   CBC: Recent Labs  Lab 02/14/20 1746 02/14/20 1746 02/15/20 0551 02/15/20 0551 02/16/20 0513 02/16/20 1001 02/16/20 1005 02/16/20 1006 02/17/20 0635  WBC 8.3  --  6.2  --  4.9  --   --   --  5.2  NEUTROABS 7.0  --   --   --   --   --   --   --   --   HGB 10.3*   < > 10.8*   < > 10.3* 10.9* 11.2* 11.2* 11.5*  HCT 33.6*   < >  33.7*   < > 32.7* 32.0* 33.0* 33.0* 35.7*  MCV 93.1  --  91.8  --  91.3  --   --   --  90.6  PLT 208  --  224  --  242  --   --   --  292   < > = values in this interval not displayed.   Basic Metabolic Panel: Recent Labs  Lab 02/14/20 1746 02/14/20 1837 02/14/20 1837 02/15/20 0551 02/15/20 0551 02/16/20 0513 02/16/20 0513 02/16/20 1001 02/16/20 1005 02/16/20 1006 02/16/20 2308 02/17/20 0635 02/17/20 1212  NA  --  140   < > 136   < > 139   < > 140 138 139 139 140  --   K  --  3.3*   < > 2.9*   < > 3.3*   < >  3.5 3.5 3.6 3.8 3.5  --   CL  --  107  --  100  --  102  --   --   --   --  102 100  --   CO2  --  25  --  27  --  27  --   --   --   --  27 29  --   GLUCOSE  --  110*  --  107*  --  98  --   --   --   --  122* 98  --   BUN  --  11  --  11  --  14  --   --   --   --  13 13  --   CREATININE  --  0.68  --  0.85  --  0.90  --   --   --   --  0.93 0.99  --   CALCIUM  --  8.7*  --  8.8*  --  9.1  --   --   --   --  9.0 9.4  --   MG 1.9  --   --   --   --  2.1  --   --   --   --  2.0 2.0 2.2   < > = values in this interval not displayed.   GFR: Estimated Creatinine Clearance: 48.5 mL/min (by C-G formula based on SCr of 0.99 mg/dL). Liver Function Tests: Recent Labs  Lab 02/14/20 1837  AST 17  ALT 10  ALKPHOS 55  BILITOT 0.9  PROT 5.9*  ALBUMIN 3.2*   Recent Labs  Lab 02/14/20 1837  LIPASE 25   No results for input(s): AMMONIA in the last 168 hours. Coagulation Profile: No results for input(s): INR, PROTIME in the last 168 hours. Cardiac Enzymes: No results for input(s): CKTOTAL, CKMB, CKMBINDEX, TROPONINI in the last 168 hours. BNP (last 3 results) No results for input(s): PROBNP in the last 8760 hours. HbA1C: No results for input(s): HGBA1C in the last 72 hours. CBG: No results for input(s): GLUCAP in the last 168 hours. Lipid Profile: Recent Labs    02/15/20 0550  CHOL 242*  HDL 68  LDLCALC 155*  TRIG 97  CHOLHDL 3.6   Thyroid Function  Tests: Recent Labs    02/15/20 0550  TSH 2.095   Anemia Panel: No results for input(s): VITAMINB12, FOLATE, FERRITIN, TIBC, IRON, RETICCTPCT in the last 72 hours. Sepsis Labs: No results for input(s): PROCALCITON, LATICACIDVEN in the last 168 hours.  Recent Results (from the past 240 hour(s))  SARS Coronavirus 2 by RT PCR (hospital order, performed in [facility] hospital lab) Nasopharyngeal Nasopharyngeal Swab     Status: None   Collection Time: 02/14/20  5:46 PM   Specimen: Nasopharyngeal Swab  Result Value Ref Range Status   SARS Coronavirus 2 NEGATIVE NEGATIVE Final    Comment: (NOTE) SARS-CoV-2 target nucleic acids are NOT DETECTED.  The SARS-CoV-2 RNA is generally detectable in upper and lower respiratory specimens during the acute phase of infection. The lowest concentration of SARS-CoV-2 viral copies this assay can detect is 250 copies / mL. A negative result does not preclude SARS-CoV-2 infection and should not be used as the sole basis for treatment or other patient management decisions.  A negative result may occur with improper specimen collection / handling, submission of specimen other than nasopharyngeal swab, presence of viral mutation(s) within the areas targeted by this assay, and  inadequate number of viral copies (<250 copies / mL). A negative result must be combined with clinical observations, patient history, and epidemiological information.  Fact Sheet for Patients:   StrictlyIdeas.no  Fact Sheet for Healthcare Providers: BankingDealers.co.za  This test is not yet approved or  cleared by the [place] FDA and has been authorized for detection and/or diagnosis of SARS-CoV-2 by FDA under an Emergency Use Authorization (EUA).  This EUA will remain in effect (meaning this test can be used) for the duration of the COVID-19 declaration under Section 564(b)(1) of the Act, 21 U.S.C. section 360bbb-3(b)(1),  unless the authorization is terminated or revoked sooner.  Performed at [facility], [place] [address] [place], [place] [address]   Culture, Urine     Status: Abnormal   Collection Time: 02/15/20  9:29 AM   Specimen: Urine, Catheterized  Result Value Ref Range Status   Specimen Description   Final    URINE, CATHETERIZED Performed at [place] [address] [place], [place] [address]    Special Requests   Final    NONE Performed at [facility], [place] [address], [place], [place] [address]    Culture (A)  Final    >=100,000 COLONIES/mL KLEBSIELLA PNEUMONIAE >=100,000 COLONIES/mL CITROBACTER BRAAKII    Report Status 02/17/2020 FINAL  Final   Organism ID, Bacteria KLEBSIELLA PNEUMONIAE (A)  Final   Organism ID, Bacteria CITROBACTER BRAAKII (A)  Final      Susceptibility   Citrobacter braakii - MIC*    CEFAZOLIN >=64 RESISTANT Resistant     CEFTRIAXONE <=0.25 SENSITIVE Sensitive     CIPROFLOXACIN <=0.25 SENSITIVE Sensitive     GENTAMICIN <=1 SENSITIVE Sensitive     IMIPENEM 1 SENSITIVE Sensitive     NITROFURANTOIN <=16 SENSITIVE Sensitive     TRIMETH/SULFA <=20 SENSITIVE Sensitive     PIP/TAZO <=4 SENSITIVE Sensitive     * >=100,000 COLONIES/mL CITROBACTER BRAAKII   Klebsiella pneumoniae - MIC*    AMPICILLIN >=32 RESISTANT Resistant     CEFAZOLIN <=4 SENSITIVE Sensitive     CEFTRIAXONE <=0.25 SENSITIVE Sensitive     CIPROFLOXACIN <=0.25 SENSITIVE Sensitive     GENTAMICIN <=1 SENSITIVE Sensitive     IMIPENEM <=0.25 SENSITIVE Sensitive     NITROFURANTOIN 256 RESISTANT Resistant     TRIMETH/SULFA <=20 SENSITIVE Sensitive     AMPICILLIN/SULBACTAM 8 SENSITIVE Sensitive     PIP/TAZO <=4 SENSITIVE Sensitive     * >=100,000 COLONIES/mL KLEBSIELLA PNEUMONIAE         Radiology Studies: CARDIAC CATHETERIZATION  Result Date: 02/16/2020  Hemodynamic findings consistent with mild (borderline) pulmonary hypertension.   LV end diastolic pressure is normal.  Ost Cx to Prox Cx lesion is 55% stenosed. -> Somewhat ulcerated mixed plaque on IVUS-estimated 50-60%, not flow-limiting by FFR (0.98),  Ost LAD to Prox LAD lesion is 30% stenosed.  Prox RCA lesion is 20% stenosed.  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  Right heart cath pressures show PCWP 18 mmHg with V wave of 24 mmHg from MR, LVEDP 14 mmHg.  PA diastolic 26 mm;  Cardiac Output-Index: 6.88-3.8 Glenetta Hew, MD       Scheduled Meds: . atorvastatin  80 mg Oral QHS  . clopidogrel  75  mg Oral Q breakfast  . magnesium oxide  400 mg Oral BID  . metoprolol succinate  12.5 mg Oral Daily  . rivaroxaban  20 mg Oral Q supper  . sodium chloride flush  3 mL Intravenous Q12H  . sodium chloride flush  3 mL Intravenous Q12H  . sodium chloride flush  3 mL Intravenous Q12H   Continuous Infusions: . sodium chloride    . sodium chloride    . cefTRIAXone (ROCEPHIN)  IV 1 g (02/17/20 1314)     LOS: 3 days   Time spent= 35 mins    Nita Sells, MD [facility]  If 7PM-7AM, please contact night-coverage  02/17/2020, 3:14 PM

## 2020-02-17 NOTE — Telephone Encounter (Signed)
TOC Patient- Please call Patient- Pt have an appt with Rosaria Ferries on 02-24-20

## 2020-02-17 NOTE — Telephone Encounter (Signed)
Pt currently admitted.

## 2020-02-17 NOTE — Progress Notes (Signed)
PT Cancellation Note  Patient Details Name: Valerie [name] MRN: [id number] DOB: 1946-04-03   Cancelled Treatment:    Reason Eval/Treat Not Completed: PT screened, no needs identified, will sign off. Order received. Chart reviewed. Per chart, at baseline, pt's husband uses a hoyer lift for OOB<>chair transfers. Pt is able to stand with assistance but is unable to ambulate. This has been her baseline for some time. Spoke briefly with pt as well. She confirms this info. She also reports that she was discharged from [facility] last week. Pt plans to return home with husband continuing to manage her care. Will sign off.     [facility]  Office: [phone] Pager: [phone]

## 2020-02-17 NOTE — Discharge Instructions (Signed)
PLEASE REMEMBER TO BRING ALL OF YOUR MEDICATIONS TO EACH OF YOUR FOLLOW-UP OFFICE VISITS.  PLEASE ATTEND ALL SCHEDULED FOLLOW-UP APPOINTMENTS.   Activity: Increase activity slowly as tolerated. You may shower, but no soaking baths (or swimming) for 1 week. No driving for 24 hours. No lifting over 5 lbs for 1 week. No sexual activity for 1 week.   Wound Care: You may wash cath site gently with soap and water. Keep cath site clean and dry. If you notice pain, swelling, bleeding or pus at your cath site, please call [phone].  Please keep a daily log of your blood pressures at home to bring to your next cardiology appointment to better assist with medication management decisions.   Heart Failure Education: 1. Weigh yourself EVERY morning after you go to the bathroom but before you eat or drink anything. Write this number down in a weight log/diary. If you gain 3 pounds overnight or 5 pounds in a week, call the office. 2. Take your medicines as prescribed. If you have concerns about your medications, please call us before you stop taking them.  3. Eat low salt foods--Limit salt (sodium) to 2000 mg per day. This will help prevent your body from holding onto fluid. Read food labels as many processed foods have a lot of sodium, especially canned goods, foods from restaurants, and prepackaged meats. If you would like some assistance choosing low sodium foods, we would be happy to set you up with a nutritionist. 4. Stay as active as you can everyday. Staying active will give you more energy and make your muscles stronger. Start with 5 minutes at a time and work your way up to 30 minutes a day. Break up your activities--do some in the morning and some in the afternoon. Start with 3 days per week and work your way up to 5 days as you can.  If you have chest pain, feel short of breath, dizzy, or lightheaded, STOP. If you don't feel better after a short rest, call 911. If you do feel better, call the office to  let us know you have symptoms with exercise. 5. Limit all fluids for the day to less than 2 liters. Fluid includes all drinks, coffee, juice, ice chips, soup, jello, and all other liquids.

## 2020-02-17 NOTE — Care Management Important Message (Signed)
Important Message  Patient Details IM Letter given to Dessa Phi RN Case Manager to present to the Patient Name: Valerie [name] MRN: [id number] Date of Birth: [birth year]   Medicare Important Message Given:  Yes     Kerin Salen 02/17/2020, 10:20 AM

## 2020-02-18 ENCOUNTER — Telehealth: Payer: Self-pay | Admitting: Medical Oncology

## 2020-02-18 ENCOUNTER — Telehealth: Payer: Self-pay

## 2020-02-18 DIAGNOSIS — R269 Unspecified abnormalities of gait and mobility: Secondary | ICD-10-CM

## 2020-02-18 DIAGNOSIS — I5041 Acute combined systolic (congestive) and diastolic (congestive) heart failure: Secondary | ICD-10-CM

## 2020-02-18 LAB — CBC
HCT: 35.4 % — ABNORMAL LOW (ref 36.0–46.0)
Hemoglobin: 11.1 g/dL — ABNORMAL LOW (ref 12.0–15.0)
MCH: 29.4 pg (ref 26.0–34.0)
MCHC: 31.4 g/dL (ref 30.0–36.0)
MCV: 93.9 fL (ref 80.0–100.0)
Platelets: 270 10*3/uL (ref 150–400)
RBC: 3.77 MIL/uL — ABNORMAL LOW (ref 3.87–5.11)
RDW: 15.6 % — ABNORMAL HIGH (ref 11.5–15.5)
WBC: 6.3 10*3/uL (ref 4.0–10.5)
nRBC: 0 % (ref 0.0–0.2)

## 2020-02-18 LAB — BASIC METABOLIC PANEL
Anion gap: 8 (ref 5–15)
BUN: 15 mg/dL (ref 8–23)
CO2: 26 mmol/L (ref 22–32)
Calcium: 9.3 mg/dL (ref 8.9–10.3)
Chloride: 102 mmol/L (ref 98–111)
Creatinine, Ser: 0.85 mg/dL (ref 0.44–1.00)
GFR calc Af Amer: >60 mL/min (ref >60)
GFR calc non Af Amer: >60 mL/min (ref >60)
Glucose, Bld: 102 mg/dL — ABNORMAL HIGH (ref 70–99)
Potassium: 3.5 mmol/L (ref 3.5–5.1)
Sodium: 136 mmol/L (ref 135–145)

## 2020-02-18 LAB — MAGNESIUM: Magnesium: 2.2 mg/dL (ref 1.7–2.4)

## 2020-02-18 MED ORDER — CEFDINIR 300 MG PO CAPS: 300.0000 mg | ORAL_CAPSULE | Freq: Two times a day (BID) | ORAL | 0 refills | Status: DC

## 2020-02-18 MED ORDER — POTASSIUM CHLORIDE CRYS ER 20 MEQ PO TBCR: 40.0000 meq | EXTENDED_RELEASE_TABLET | Freq: Every day | ORAL | 0 refills | Status: DC

## 2020-02-18 MED ORDER — METOPROLOL SUCCINATE ER 25 MG PO TB24: 25.0000 mg | ORAL_TABLET | Freq: Every day | ORAL | 2 refills | Status: DC

## 2020-02-18 MED ORDER — METOPROLOL SUCCINATE ER 25 MG PO TB24
25.0000 mg | ORAL_TABLET | Freq: Every day | ORAL | Status: DC
  Administered 2020-02-18: 25 mg via ORAL
  Filled 2020-02-18: qty 1

## 2020-02-18 MED ORDER — CLOPIDOGREL BISULFATE 75 MG PO TABS: 75.0000 mg | ORAL_TABLET | Freq: Every day | ORAL | 6 refills | Status: DC

## 2020-02-18 MED ORDER — AMMONIUM LACTATE 12 % EX LOTN: TOPICAL_LOTION | Freq: Two times a day (BID) | CUTANEOUS | 0 refills | Status: DC

## 2020-02-18 MED ORDER — ATORVASTATIN CALCIUM 80 MG PO TABS: 80.0000 mg | ORAL_TABLET | Freq: Every day | ORAL | 0 refills | Status: DC

## 2020-02-18 MED ORDER — METOPROLOL SUCCINATE ER 25 MG PO TB24: 25.0000 mg | ORAL_TABLET | Freq: Every day | ORAL | Status: DC

## 2020-02-18 MED ORDER — POTASSIUM CHLORIDE CRYS ER 20 MEQ PO TBCR
40.0000 meq | EXTENDED_RELEASE_TABLET | Freq: Every day | ORAL | Status: DC
  Administered 2020-02-18: 40 meq via ORAL
  Filled 2020-02-18: qty 2

## 2020-02-18 MED ORDER — CEFDINIR 300 MG PO CAPS: 300.0000 mg | ORAL_CAPSULE | Freq: Two times a day (BID) | ORAL | Status: DC

## 2020-02-18 NOTE — Discharge Summary (Signed)
Physician Discharge Summary  BILAN TEDESCO ZHG:[id number] DOB: [birth year] DOA: 02/14/2020  PCP: [name], MD  Admit date: 02/14/2020 Discharge date: 02/18/2020  Time spent: 45 minutes  Recommendations for Outpatient Follow-up:  1. New medications this admission include atorvastatin 80, metoprolol XL 25 daily in addition to Xarelto 2. Continue Plavix at the recommendation of cardiology for 6 months and then stop 3. Recommend further goals of care initiation as an outpatient with Dr. Lorna Few who has been CCed on this note for care coordination 4. Needs Chem-12 CBC 1 week 5. Complete antibiotics 7/2  Discharge Diagnoses:  Principal Problem:   Congestive heart failure (CHF) (HCC) Active Problems:   Malignant neoplasm of brain (HCC)   Lung metastasis (HCC)   Dementia (HCC)   History of DVT of lower extremity   Acute combined systolic and diastolic heart failure (HCC)   Elevated troponin   Dilated cardiomyopathy ([place])   Coronary artery disease involving native coronary artery of native heart without angina pectoris   Discharge Condition: Fair  Diet recommendation: Heart healthy  Filed Weights   02/15/20 0049 02/15/20 0126 02/16/20 0500  Weight: 69.6 kg 69.6 kg 69.6 kg    History of present illness:  74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   Admitted 02/14/2020  new onset A. fib, CHF exacerbation and UTI.  Echo = new onset cardiomyopathy with EF of 20-25%, underwent left heart and right heart catheterization on 6/29 showing minimal coronary artery disease, 50% LCx.   Plans to manage it medically.  Oncology team notified.   Hospital Course:  Dyspnea on exertion with left-sided pleural effusion Acute congestive  heart failure with reduced ejection fraction, EF 20%, class III Cardiomyopathy likely secondary to [place] -Echocardiogram 6/28-EF 19%, grade 1 diastolic dysfunction.  Elevated PASP-patient not a candidate for LifeVest or pacemaker -I see below discussion -TSH normal  -LDL 155-statin started this admission  NEw onset atrial fibrillation CHADS2 score >4  sinus tachycardia + V. tach with new left bundle branch block Prolonged QTC with hypokalemia -Intermittent LBB with intermittent SVT -Heart rates maintaining in the 120s this morning -Rate control is limited by hypotension -Meds adjusted to metoprolol XL 25 Just have to accept higher rates if she becomes hypotensive with increased dose - did receive potassium supplementation -Magnesium is improved -Continue Xarelto Plavix is to be continued per Dr. Allison Quarry note for 6 months and then should be stopped   Hyperlipidemia -LDL 155.  Lipitor daily started  Dirty UA, urinary tract infection, symptomatic -Urine cultures-Citrobacter in addition to Klebsiella -Antibiotics started 6/28-currently on Omnicef stop date 7/2  Anemia of chronic disease -Hemoglobin at baseline around 11.0.  History of metastatic non-small cell lung cancer with brain mets -PTA med Tagrisso, oncology team made aware especially about reduced EF/cardiomyopathy.   -Chemo will be reconsidered per them in the outpatient setting  Puncture wound Sacral decubiti? No open wound rather she has hyperpigmentation likely from the chronic steroids that her husband has been using She will be placed on Lac-Hydrin  It appears she was getting zinc oxide and no It will be a challenge to prevent her from developing further breakdown  Frailty Patient had complex fracture of right lower extremity in October She has been getting home therapy at home since then She has a chair  lift in addition to a [place] at home Therapy saw the patient and felt that she was at baseline on  6/30  Procedures: Echocardiogram Consultations:  Cardiology  Oncology  Discharge Exam: Vitals:   02/18/20 0917 02/18/20 0958  BP: 101/68 100/64  Pulse: (!) 110 (!) 107  Resp: (!) 24   Temp: (!) 97.5 F (36.4 C)   SpO2: 99%     General: Awake alert coherent no distress bitemporal wasting Cardiovascular: S1-S2 tachycardic rates in the low 100s currently holosystolic murmur Respiratory: Clear no rales no rhonchi  Discharge Instructions    Allergies as of 02/18/2020      Reactions   Keflex [cephalexin] Itching, Rash   Throat swelling      Medication List    STOP taking these medications   Advil 200 MG Caps Generic drug: Ibuprofen   cholecalciferol 1000 units tablet Commonly known as: VITAMIN D   diphenoxylate-atropine 2.5-0.025 MG tablet Commonly known as: LOMOTIL   donepezil 10 MG tablet Commonly known as: ARICEPT   loperamide 2 MG capsule Commonly known as: IMODIUM   metroNIDAZOLE 500 MG tablet Commonly known as: FLAGYL   nitrofurantoin (macrocrystal-monohydrate) 100 MG capsule Commonly known as: MACROBID     TAKE these medications   ammonium lactate 12 % lotion Commonly known as: LAC-HYDRIN Apply topically 2 (two) times daily.   atorvastatin 80 MG tablet Commonly known as: LIPITOR Take 1 tablet (80 mg total) by mouth at bedtime.   cefdinir 300 MG capsule Commonly known as: OMNICEF Take 1 capsule (300 mg total) by mouth every 12 (twelve) hours.   MAGNESIUM PO Take 1 tablet by mouth daily.   metoprolol succinate 25 MG 24 hr tablet Commonly known as: TOPROL-XL Take 1 tablet (25 mg total) by mouth daily. Start taking on: February 19, 2020   Multi-Vitamins Tabs Take 1 tablet by mouth every evening.   polyethylene glycol 17 g packet Commonly known as: MIRALAX / GLYCOLAX Take 17 g by mouth daily. What changed:   when to take this  reasons to take this   potassium chloride SA 20 MEQ tablet Commonly known as: KLOR-CON Take 2 tablets (40  mEq total) by mouth daily. Start taking on: February 19, 2020   Tagrisso 80 MG tablet Generic drug: osimertinib mesylate TAKE 1 TABLET (80MG ) BY MOUTH DAILY. What changed: See the new instructions.   vitamin B-12 1000 MCG tablet Commonly known as: CYANOCOBALAMIN Take 1,000 mcg by mouth every evening.   vitamin C 100 MG tablet Take 100 mg by mouth every evening. Gummie   Xarelto 20 MG Tabs tablet Generic drug: rivaroxaban Take 1 tablet (20 mg total) by mouth every evening.      Allergies  Allergen Reactions  . Keflex [Cephalexin] Itching and Rash    Throat swelling    Follow-up Information    Barrett, Evelene Croon, PA-C Follow up on 02/24/2020.   Specialties: Cardiology, Radiology Why: Please arrive 15 minutes early for your 3:15pm post-hospital cardiology follow-up appointment.  Contact information: [address] [place] [address] [phone]                The results of significant diagnostics from this hospitalization (including imaging, microbiology, ancillary and laboratory) are listed below for reference.    Significant Diagnostic Studies: DG Chest 2 View  Result Date: 02/14/2020 CLINICAL DATA:  Shortness of breath.  Lung cancer. EXAM: CHEST - 2 VIEW COMPARISON:  06/16/2019 FINDINGS: Cardiomegaly, vascular congestion. Small left pleural effusion with left lower  lobe atelectasis or infiltrate. Postoperative changes on the left. No acute bony abnormality. IMPRESSION: Cardiomegaly with vascular congestion. Small left pleural effusion with left base atelectasis or infiltrate. Electronically Signed   By: Rolm Baptise M.D.   On: 02/14/2020 17:49   CARDIAC CATHETERIZATION  Result Date: 02/16/2020  Hemodynamic findings consistent with mild (borderline) pulmonary hypertension.  LV end diastolic pressure is normal.  Ost Cx to Prox Cx lesion is 55% stenosed. -> Somewhat ulcerated mixed plaque on IVUS-estimated 50-60%, not flow-limiting by FFR (0.98),  Ost LAD  to Prox LAD lesion is 30% stenosed.  Prox RCA lesion is 20% stenosed.  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  Right heart cath pressures show PCWP 18 mmHg with V wave of 24 mmHg from MR, LVEDP 14 mmHg.  PA diastolic 26 mm;  Cardiac Output-Index: 6.88-3.8 Glenetta Hew, MD  ECHOCARDIOGRAM COMPLETE  Result Date: 02/15/2020    ECHOCARDIOGRAM REPORT   Patient Name:   Valerie [name] [facility] Date of Exam: 02/15/2020 Medical Rec #:  [id number]           Height:       67.0 in Accession #:    [id number]          Weight:       153.4 lb Date of Birth:  1945/12/13          BSA:          1.807 m Patient Age:    38 years            BP:           109/61 mmHg Patient Gender: F                   HR:           82 bpm. Exam Location:  Inpatient Procedure: 2D Echo, Cardiac Doppler and Color Doppler Indications:    CHF  History:        Patient has no prior history of Echocardiogram examinations.                 CHF, Signs/Symptoms:Dyspnea, dementia and Murmur; Risk                 Factors:Hypertension. Lung cancer.  Sonographer:    Dustin Flock Referring Phys: [id number] [place]  1. Left ventricular ejection fraction, by estimation, is 20 to 25%. The left ventricle has severely decreased function. The left ventricle demonstrates global hypokinesis. Left ventricular diastolic parameters are consistent with Grade I diastolic dysfunction (impaired relaxation).  2. Right ventricular systolic function is normal. The right ventricular size is normal. There is mildly elevated pulmonary artery systolic pressure.  3. The mitral valve is abnormal. Mild to moderate mitral valve regurgitation.  4. The aortic valve is grossly normal. Aortic valve  regurgitation is mild.  5. The inferior vena cava is dilated in size with >50% respiratory variability, suggesting right atrial pressure of 8 mmHg. FINDINGS  Left Ventricle: Left ventricular ejection fraction, by estimation, is 20 to 25%. The left ventricle has severely decreased function. The left ventricle demonstrates global hypokinesis. The left ventricular internal cavity size was normal in size. There is no left ventricular hypertrophy. Left ventricular diastolic parameters are consistent with Grade I diastolic dysfunction (  impaired relaxation). Right Ventricle: The right ventricular size is normal. No increase in right ventricular wall thickness. Right ventricular systolic function is normal. There is mildly elevated pulmonary artery systolic pressure. The tricuspid regurgitant velocity is 2.95  m/s, and with an assumed right atrial pressure of 3 mmHg, the estimated right ventricular systolic pressure is 49.4 mmHg. Left Atrium: Left atrial size was normal in size. Right Atrium: Right atrial size was normal in size. Pericardium: Trivial pericardial effusion is present. Mitral Valve: The mitral valve is abnormal. There is mild thickening of the mitral valve leaflet(s). Mild to moderate mitral valve regurgitation. Tricuspid Valve: The tricuspid valve is normal in structure. Tricuspid valve regurgitation is mild. Aortic Valve: The aortic valve is grossly normal. Aortic valve regurgitation is mild. Aortic regurgitation PHT measures 578 msec. Pulmonic Valve: The pulmonic valve was not well visualized. Pulmonic valve regurgitation is not visualized. Aorta: The aortic root is normal in size and structure. Venous: The inferior vena cava is dilated in size with greater than 50% respiratory variability, suggesting right atrial pressure of 8 mmHg. IAS/Shunts: No atrial level shunt detected by color flow Doppler.  LEFT VENTRICLE PLAX 2D LVIDd:         5.50 cm  Diastology LVIDs:         4.70 cm  LV e' lateral:   4.35 cm/s  LV PW:         0.90 cm  LV E/e' lateral: 12.4 LV IVS:        0.80 cm  LV e' medial:    3.81 cm/s LVOT diam:     1.80 cm  LV E/e' medial:  14.1 LV SV:         43 LV SV Index:   24 LVOT Area:     2.54 cm  RIGHT VENTRICLE RV Basal diam:  2.70 cm RV S prime:     11.60 cm/s TAPSE (M-mode): 2.5 cm LEFT ATRIUM           Index       RIGHT ATRIUM           Index LA diam:      3.20 cm 1.77 cm/m  RA Area:     16.70 cm LA Vol (A2C): 45.4 ml 25.13 ml/m RA Volume:   46.90 ml  25.96 ml/m LA Vol (A4C): 67.6 ml 37.41 ml/m  AORTIC VALVE LVOT Vmax:   120.00 cm/s LVOT Vmean:  77.200 cm/s LVOT VTI:    0.170 m AI PHT:      578 msec  AORTA Ao Root diam: 3.20 cm MITRAL VALVE                TRICUSPID VALVE MV Area (PHT): 4.60 cm     TR Peak grad:   34.8 mmHg MV Decel Time: 165 msec     TR Vmax:        295.00 cm/s MV E velocity: 53.90 cm/s MV A velocity: 100.00 cm/s  SHUNTS MV E/A ratio:  0.54         Systemic VTI:  0.17 m                             Systemic Diam: 1.80 cm Dorris Carnes MD Electronically signed by Dorris Carnes MD Signature Date/Time: 02/15/2020/1:19:27 PM    Final     Microbiology: Recent Results (from the past 240 hour(s))  SARS Coronavirus 2 by RT PCR (hospital order, performed in [facility] hospital lab) Nasopharyngeal Nasopharyngeal Swab     Status: None   Collection Time: 02/14/20  5:46 PM   Specimen: Nasopharyngeal Swab  Result Value Ref Range Status   SARS Coronavirus 2 NEGATIVE NEGATIVE Final    Comment: (NOTE) SARS-CoV-2 target nucleic acids are NOT DETECTED.  The SARS-CoV-2 RNA is generally detectable in upper and lower respiratory specimens during the acute phase of infection. The lowest concentration of SARS-CoV-2 viral copies this assay can detect is 250 copies / mL. A negative result does not preclude SARS-CoV-2 infection and should not be used as the sole basis for treatment or other patient management decisions.  A negative result may occur with improper specimen collection / handling,  submission of specimen other than nasopharyngeal swab, presence of viral mutation(s) within the areas targeted by this assay, and inadequate number of viral copies (<250 copies / mL). A negative result must be combined with clinical observations, patient history, and epidemiological information.  Fact Sheet for Patients:   StrictlyIdeas.no  Fact Sheet for Healthcare Providers: BankingDealers.co.za  This test is not yet approved or  cleared by the [place] FDA and has been authorized for detection and/or diagnosis of SARS-CoV-2 by FDA under an Emergency Use Authorization (EUA).  This EUA will remain in effect (meaning this test can be used) for the duration of the COVID-19 declaration under Section 564(b)(1) of the Act, 21 U.S.C. section 360bbb-3(b)(1), unless the authorization is terminated or revoked sooner.  Performed at [facility], [place] [address] [place], [place] [address]   Culture, Urine     Status: Abnormal   Collection Time: 02/15/20  9:29 AM   Specimen: Urine, Catheterized  Result Value Ref Range Status   Specimen Description   Final    URINE, CATHETERIZED Performed at [facility] [address] [place], [place] [address]    Special Requests   Final    NONE Performed at [facility], [place] [address], [place], [place] [address]    Culture (A)  Final    >=100,000 COLONIES/mL KLEBSIELLA PNEUMONIAE >=100,000 COLONIES/mL CITROBACTER BRAAKII    Report Status 02/17/2020 FINAL  Final   Organism ID, Bacteria KLEBSIELLA PNEUMONIAE (A)  Final   Organism ID, Bacteria CITROBACTER BRAAKII (A)  Final      Susceptibility   Citrobacter braakii - MIC*    CEFAZOLIN >=64 RESISTANT Resistant     CEFTRIAXONE <=0.25 SENSITIVE Sensitive     CIPROFLOXACIN <=0.25 SENSITIVE Sensitive     GENTAMICIN <=1 SENSITIVE Sensitive     IMIPENEM 1 SENSITIVE Sensitive     NITROFURANTOIN  <=16 SENSITIVE Sensitive     TRIMETH/SULFA <=20 SENSITIVE Sensitive     PIP/TAZO <=4 SENSITIVE Sensitive     * >=100,000 COLONIES/mL CITROBACTER BRAAKII   Klebsiella pneumoniae - MIC*    AMPICILLIN >=32 RESISTANT Resistant     CEFAZOLIN <=4 SENSITIVE Sensitive     CEFTRIAXONE <=0.25 SENSITIVE Sensitive     CIPROFLOXACIN <=0.25 SENSITIVE Sensitive     GENTAMICIN <=1 SENSITIVE Sensitive     IMIPENEM <=0.25 SENSITIVE Sensitive     NITROFURANTOIN 256 RESISTANT Resistant     TRIMETH/SULFA <=20 SENSITIVE Sensitive     AMPICILLIN/SULBACTAM 8 SENSITIVE Sensitive     PIP/TAZO <=4 SENSITIVE Sensitive     * >=100,000 COLONIES/mL KLEBSIELLA PNEUMONIAE     Labs: Basic Metabolic Panel: Recent Labs  Lab 02/14/20 1746 02/15/20 0551 02/16/20 0513 02/16/20 1001 02/16/20 1005 02/16/20 1006 02/16/20 2308 02/17/20 4481 02/17/20 1212  02/18/20 0703  NA   < > 136 139   < > 138 139 139 140  --  136  K   < > 2.9* 3.3*   < > 3.5 3.6 3.8 3.5  --  3.5  CL  --  100 102  --   --   --  102 100  --  102  CO2  --  27 27  --   --   --  27 29  --  26  GLUCOSE  --  107* 98  --   --   --  122* 98  --  102*  BUN  --  11 14  --   --   --  13 13  --  15  CREATININE  --  0.85 0.90  --   --   --  0.93 0.99  --  0.85  CALCIUM  --  8.8* 9.1  --   --   --  9.0 9.4  --  9.3  MG   < >  --  2.1  --   --   --  2.0 2.0 2.2 2.2   < > = values in this interval not displayed.   Liver Function Tests: Recent Labs  Lab 02/14/20 1837  AST 17  ALT 10  ALKPHOS 55  BILITOT 0.9  PROT 5.9*  ALBUMIN 3.2*   Recent Labs  Lab 02/14/20 1837  LIPASE 25   No results for input(s): AMMONIA in the last 168 hours. CBC: Recent Labs  Lab 02/14/20 1746 02/14/20 1746 02/15/20 0551 02/15/20 0551 02/16/20 0513 02/16/20 0513 02/16/20 1001 02/16/20 1005 02/16/20 1006 02/17/20 0635 02/18/20 0703  WBC 8.3  --  6.2  --  4.9  --   --   --   --  5.2 6.3  NEUTROABS 7.0  --   --   --   --   --   --   --   --   --   --   HGB  10.3*   < > 10.8*   < > 10.3*   < > 10.9* 11.2* 11.2* 11.5* 11.1*  HCT 33.6*   < > 33.7*   < > 32.7*   < > 32.0* 33.0* 33.0* 35.7* 35.4*  MCV 93.1  --  91.8  --  91.3  --   --   --   --  90.6 93.9  PLT 208  --  224  --  242  --   --   --   --  292 270   < > = values in this interval not displayed.   Cardiac Enzymes: No results for input(s): CKTOTAL, CKMB, CKMBINDEX, TROPONINI in the last 168 hours. BNP: BNP (last 3 results) Recent Labs    02/14/20 1746  BNP 985.5*    ProBNP (last 3 results) No results for input(s): PROBNP in the last 8760 hours.  CBG: No results for input(s): GLUCAP in the last 168 hours.     Signed:  Nita Sells MD   [facility] 02/18/2020, 11:42 AM

## 2020-02-18 NOTE — Telephone Encounter (Signed)
Pt called from her inpt room .Asking to speak to Dr Julien Nordmann regarding stopping her Tagrisso.

## 2020-02-18 NOTE — Telephone Encounter (Signed)
HOSPITAL FOLLOW UP APPOINTMENT NOT MADE DUE TO TRANSPORTATION ISSUE. REFERRAL SENT TO CCM TO HELP WITH THIS. REFERRAL SENT FOR [facility] FOR THEM TO GO OUT TO CHECK ON PATIENT.

## 2020-02-18 NOTE — Telephone Encounter (Signed)
..  Transition Care Management Follow-up Telephone Call  Date of discharge and from where: February 18, 2020  How have you been since you were released from the hospital? Fine. Came home and slept Any questions or concerns?no Items Reviewed:  Did the pt receive and understand the discharge instructions provided?yes   Medications obtained and verified? Yes   Any new allergies since your discharge? no  Dietary orders reviewed? yes  Do you have support at home? yes  Other (ie: DME, Home Health, etc) no  Functional Questionnaire: (I = Independent and D = Dependent)  Bathing/Dressing- d   Meal Prep-d  Eating- D  Maintaining continence- D  Transferring/Ambulation- D  Managing Meds- D   Follow up appointments reviewed:    PCP Hospital f/u appt confirmed? [facility] f/u appt confirmed? July 7  Are transportation arrangements needed?YES   If their condition worsens, is the pt aware to call  their PCP or go to the ED? YES   Was the patient provided with contact information for the PCP's office or ED? YES   Was the pt encouraged to call back with questions or concerns? YES

## 2020-02-18 NOTE — TOC Transition Note (Signed)
Transition of Care [facility]) - CM/SW Discharge Note   Patient Details  Name: Valerie [name] MRN: [id number] Date of Birth: 01-Feb-1946  Transition of Care [facility]) CM/SW Contact:  Shade Flood, LCSW Phone Number: 02/18/2020, 12:01 PM   Clinical Narrative:     Received consult from MD requesting follow up with pt's husband for dc planning. Spoke with pt's husband by phone this AM. He indicates that they would like to have a referral for outpatient PT at dc stating that he would like for pt to be able to work with a PT on ambulation. He states that [facility] PT was recently discontinued and that he feels pt would benefit from being able to use the parallel bars in the outpatient setting to help her be able to ambulate. Pt's husband states they would like to use [facility] for the PT as that is where pt's orthopedist is located. Obtained hard copy RX for the PT referral and this will be sent home with pt. Pt's husband states he will contact the office to schedule an appointment for pt.  MD plans to dc pt home later today. Pt will need EMS transport home. Paperwork provided in dc envelope. Will call PTAR when RN indicates pt ready for transport.  No other TOC needs identified at this time.  Expected Discharge Plan: Home/Self Care Barriers to Discharge: Barriers Resolved   Patient Goals and CMS Choice        Expected Discharge Plan and Services Expected Discharge Plan: Home/Self Care In-house Referral: Clinical Social Work   Post Acute Care Choice: Resumption of Svcs/PTA Provider Living arrangements for the past 2 months: Single Family Home                                      Prior Living Arrangements/Services Living arrangements for the past 2 months: Single Family Home Lives with:: Spouse Patient language and need for interpreter reviewed:: Yes Do you feel safe going back to the place where you live?: Yes      Need for Family Participation in Patient  Care: Yes (Comment) Care giver support system in place?: Yes (comment) Current home services: DME Criminal Activity/Legal Involvement Pertinent to Current Situation/Hospitalization: No - Comment as needed  Activities of Daily Living Home Assistive Devices/Equipment: [employer] fast streamer, Wheelchair, Shower chair with back ADL Screening (condition at time of admission) Patient's cognitive ability adequate to safely complete daily activities?: Yes Is the patient deaf or have difficulty hearing?: No Does the patient have difficulty seeing, even when wearing glasses/contacts?: No Does the patient have difficulty concentrating, remembering, or making decisions?: No Patient able to express need for assistance with ADLs?: Yes Does the patient have difficulty dressing or bathing?: No Independently performs ADLs?: No Communication: Independent Dressing (OT): Needs assistance Is this a change from baseline?: Pre-admission baseline Grooming: Needs assistance Is this a change from baseline?: Pre-admission baseline Feeding: Independent Bathing: Needs assistance Is this a change from baseline?: Pre-admission baseline Toileting: Needs assistance Is this a change from baseline?: Pre-admission baseline In/Out Bed: Dependent Is this a change from baseline?: Pre-admission baseline Walks in Home:  (bedbound) Does the patient have difficulty walking or climbing stairs?: Yes Weakness of Legs: Both Weakness of Arms/Hands: Both  Permission Sought/Granted                  Emotional Assessment       Orientation: : Oriented to Self,  Oriented to Place, Oriented to  Time, Oriented to Situation Alcohol / Substance Use: Not Applicable Psych Involvement: No (comment)  Admission diagnosis:  Elevated troponin [R77.8] Acute respiratory failure with hypoxia (HCC) [J96.01] Congestive heart failure (CHF) (HCC) [I50.9] Pulmonary vascular congestion [R09.89] Hypervolemia, unspecified hypervolemia type  [E87.70] Patient Active Problem List   Diagnosis Date Noted  . Acute combined systolic and diastolic heart failure ([place])   . Elevated troponin   . Dilated cardiomyopathy ([place])   . Coronary artery disease involving native coronary artery of native heart without angina pectoris   . Congestive heart failure (CHF) ([place]) 02/14/2020  . Pyelonephritis 09/10/2019  . Acute blood loss anemia 06/12/2019  . Closed fracture of distal end of right femur, initial encounter ([place]) 06/11/2019  . Closed fracture of lateral portion of right tibial plateau 06/11/2019  . History of DVT of lower extremity 06/11/2019  . Blurry vision 01/06/2019  . Headache 07/24/2016  . Cough 08/24/2015  . Chronic diarrhea 08/24/2015  . Sepsis ([place])   . Encounter for antineoplastic chemotherapy 01/31/2015  . Urinary retention 10/20/2014  . Ileus ([place]) 10/20/2014  . Cellulitis of leg, right 10/20/2014  . Fracture of right proximal fibula   . Ankle fracture 10/02/2014  . Ankle fracture 10/02/2014  . Lung cancer ([place]) 10/02/2014  . Closed fibular fracture 10/02/2014  . Dementia ([place]) 10/02/2014  . Lower urinary tract infectious disease 10/02/2014  . Reactive airway disease 10/02/2014  . Fall 10/02/2014  . Closed left ankle fracture 10/01/2014  . Right fibular fracture 10/01/2014  . Lung metastasis ([place])   . LESION, ANUS 04/26/2009  . DIARRHEA 04/26/2009  . Primary cancer of right upper lobe of lung ([place]) 04/22/2009  . Malignant neoplasm of brain ([place]) 04/22/2009  . VITAMIN D DEFICIENCY 04/22/2009  . ANAL FISTULA 04/22/2009  . OSTEOPOROSIS 04/22/2009   PCP:  [name], MD Pharmacy:   [place] [place] [facility] [address] [place] [address] Phone: [phone] Fax: [phone]     Social Determinants of Health (SDOH) Interventions    Readmission Risk Interventions Readmission Risk Prevention Plan 02/18/2020  Transportation Screening  Complete  PCP or Specialist Appt within 5-7 Days Complete  Home Care Screening Patient refused  Medication Review (RN CM) Complete  Some recent data might be hidden     Final next level of care: Home/Self Care Barriers to Discharge: Barriers Resolved   Patient Goals and CMS Choice        Discharge Placement                       Discharge Plan and Services In-house Referral: Clinical Social Work   Post Acute Care Choice: Resumption of Svcs/PTA Provider                               Social Determinants of Health (SDOH) Interventions     Readmission Risk Interventions Readmission Risk Prevention Plan 02/18/2020  Transportation Screening Complete  PCP or Specialist Appt within 5-7 Days Complete  Home Care Screening Patient refused  Medication Review (RN CM) Complete  Some recent data might be hidden

## 2020-02-18 NOTE — Telephone Encounter (Signed)
We will just hold it for now because of her congestive heart failure.  Tagrisso can worsen congestive heart failure.  Valerie [name] will discuss with the patient when she see her today.  Thank you.

## 2020-02-18 NOTE — Telephone Encounter (Signed)
Patient currently admitted

## 2020-02-18 NOTE — Care Management Important Message (Signed)
Important Message  Patient Details IM Letter present to the Patient Name: Valerie [name] MRN: [id number] Date of Birth: 1946/03/16   Medicare Important Message Given:  Yes     Kerin Salen 02/18/2020, 10:58 AM

## 2020-02-18 NOTE — Progress Notes (Signed)
Brief oncology note:  Note plan for discharge to home later today.  The patient would like to speak to medical oncology regarding her Tagrisso.  She really only wants to speak with Dr. Julien Nordmann.  I advised the patient that due to her new cardiomyopathy that she should stay off Tagrisso at this time.  I have sent a scheduling message to the medical oncology scheduling team to arrange for outpatient follow-up with Dr. Julien Nordmann next week to discuss further treatment options.  Mikey Bussing, DNP, AGPCNP-BC, AOCNP Mon/Tues/Thurs/Fri 7am-5pm; Off Wednesdays Cell: [phone]

## 2020-02-18 NOTE — Progress Notes (Signed)
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   Admitted 02/14/2020  new onset A. fib, CHF exacerbation and UTI.  Echo = new onset cardiomyopathy with EF of 20-25%, underwent left heart and right heart catheterization on 6/29 showing minimal coronary artery disease, 50% LCx.   Plans to manage it medically.  Oncology team notified.   Assessment & Plan:   Principal Problem:   Congestive heart failure (CHF) (HCC) Active Problems:   Malignant neoplasm of brain (HCC)   Lung metastasis (HCC)   Dementia ([place])   History of DVT of lower extremity   Acute combined systolic and diastolic heart failure (HCC)   Elevated troponin   Dilated cardiomyopathy ([place])   Coronary artery disease involving native coronary artery of native heart without angina pectoris  Dyspnea on exertion with left-sided pleural effusion Acute congestive heart failure with reduced ejection fraction, EF 20%, class III Cardiomyopathy likely secondary to [place] -Unclear if this effusion is related to malignancy versus CHF -Echocardiogram 6/28-EF 31%, grade 1 diastolic dysfunction.  Elevated PASP -Initially on Lasix -Cardiology recommends metoprolol succinate 12.5 daily-see below discussion -TSH normal  -LDL 155  NEw onset atrial fibrillation CHADS2 score >4  sinus tachycardia + V. tach with new left bundle branch block Prolonged QTC with hypokalemia -Intermittent V. tach  -Heart rates maintaining in the 120s this morning -Rate control is limited by hypotension but I will increase her metoprolol XL to 25 this morning and assess her control during  the day-we may just have to accept higher rates if she becomes hypotensive with increased dose -I will give a dose of K. Dur given her potassium is below 4 -Magnesium is improved -Continue Xarelto  Hyperlipidemia -LDL 155.  Lipitor daily started  Dirty UA, urinary tract infection, symptomatic -Urine cultures-Citrobacter in addition to Klebsiella -Antibiotics started 6/28-currently on Omnicef stop date 7/2  Anemia of chronic disease -Hemoglobin at baseline around 11.0.  History of metastatic non-small cell lung cancer with brain mets -PTA med Tagrisso, oncology team made aware especially about reduced EF/cardiomyopathy.   -Chemo will be reconsidered per them in the outpatient setting  Puncture wound Sacral decubiti? I do not visualize an open wound rather she has hyperpigmentation likely from the chronic steroids that her husband has been using She will be placed on Lac-Hydrin  It appears she was getting zinc oxide and It will be a challenge to prevent her from developing further breakdown  Frailty Patient had complex fracture of right lower extremity in October She has been getting home therapy at home since then She has a chair lift in addition to a [place] at home Therapy saw the patient and felt that she was at baseline on 6/30   DVT prophylaxis: Transitioned back to Xarelto A. fib dosing Code Status: Full code Family Communication: I spoke to the husband in detail on 6/30 p.m. and explained that she might be discharge soon-he inquires about need for therapy versus placement Looks like she is at her usual baseline I will defer to case management to discuss options for further rehab care  Status is: Inpatient  Remains inpatient appropriate because:IV treatments appropriate due to intensity of illness or  inability to take PO   Dispo: The patient is from: Home              Anticipated d/c is to: Home              Anticipated d/c date is: 1-2 days              Patient  currently is not medically stable to d/c.  Patient went to [facility] for left heart catheterization, will remain in the hospital tonight for cardiac management per cardiology service.  Hopefully discharge home in next 1-2 days.  In the meantime we will also have to determine her ongoing cancer care.  Body mass index is 24.03 kg/m.  Pressure Injury 09/17/19 Heel Deep Tissue Pressure Injury - Purple or maroon localized area of discolored intact skin or blood-filled blister due to damage of underlying soft tissue from pressure and/or shear. (Active)  09/17/19 0948  Location: Heel  Location Orientation:   Staging: Deep Tissue Pressure Injury - Purple or maroon localized area of discolored intact skin or blood-filled blister due to damage of underlying soft tissue from pressure and/or shear.  Wound Description (Comments):   Present on Admission: Yes   Subjective: Awake coherent pleasant had a good breakfast having multiple stools not soft No chest pain no fever no chills Alerted that she is having tachycardia this morning  Review of Systems Otherwise negative except as per HPI, including: Unable to obtain  Examination: Awake coherent no distress frail cachectic appearing bitemporal and supraclavicular wasting No thrush No JVD Holosystolic murmur 4/6 best heard LUSE On monitor she has sinus tach Abdomen is soft I did not examine skin today Objective: Vitals:   02/17/20 2100 02/18/20 0100 02/18/20 0531 02/18/20 0917  BP: (!) 96/59 (!) 94/56 119/66 101/68  Pulse: 93 98 96 (!) 110  Resp: 18 14 20  (!) 24  Temp: 98.3 F (36.8 C) 98.3 F (36.8 C) 98.3 F (36.8 C) (!) 97.5 F (36.4 C)  TempSrc: Oral Oral Oral Oral  SpO2: 100% 100% 98% 99%  Weight:      Height:        Intake/Output Summary (Last 24 hours) at 02/18/2020 0933 Last data filed at 02/18/2020 0531 Gross per 24 hour  Intake 290 ml  Output 1025 ml  Net -735 ml   Filed Weights   02/15/20 0049 02/15/20 0126  02/16/20 0500  Weight: 69.6 kg 69.6 kg 69.6 kg     Data Reviewed:   CBC: Recent Labs  Lab 02/14/20 1746 02/14/20 1746 02/15/20 0551 02/15/20 0551 02/16/20 0513 02/16/20 0513 02/16/20 1001 02/16/20 1005 02/16/20 1006 02/17/20 0635 02/18/20 0703  WBC 8.3  --  6.2  --  4.9  --   --   --   --  5.2 6.3  NEUTROABS 7.0  --   --   --   --   --   --   --   --   --   --   HGB 10.3*   < > 10.8*   < > 10.3*   < > 10.9* 11.2* 11.2* 11.5* 11.1*  HCT 33.6*   < > 33.7*   < > 32.7*   < > 32.0* 33.0* 33.0* 35.7* 35.4*  MCV 93.1  --  91.8  --  91.3  --   --   --   --  90.6 93.9  PLT 208  --  224  --  242  --   --   --   --  292 270   < > = values in this interval not displayed.   Basic Metabolic Panel: Recent Labs  Lab 02/14/20 1746 02/15/20 0551 02/16/20 0513 02/16/20 1001 02/16/20 1005 02/16/20 1006 02/16/20 2308 02/17/20 0635 02/17/20 1212 02/18/20 0703  NA   < > 136 139   < > 138 139 139 140  --  136  K   < > 2.9* 3.3*   < > 3.5 3.6 3.8 3.5  --  3.5  CL  --  100 102  --   --   --  102 100  --  102  CO2  --  27 27  --   --   --  27 29  --  26  GLUCOSE  --  107* 98  --   --   --  122* 98  --  102*  BUN  --  11 14  --   --   --  13 13  --  15  CREATININE  --  0.85 0.90  --   --   --  0.93 0.99  --  0.85  CALCIUM  --  8.8* 9.1  --   --   --  9.0 9.4  --  9.3  MG   < >  --  2.1  --   --   --  2.0 2.0 2.2 2.2   < > = values in this interval not displayed.   GFR: Estimated Creatinine Clearance: 56.5 mL/min (by C-G formula based on SCr of 0.85 mg/dL). Liver Function Tests: Recent Labs  Lab 02/14/20 1837  AST 17  ALT 10  ALKPHOS 55  BILITOT 0.9  PROT 5.9*  ALBUMIN 3.2*   Recent Labs  Lab 02/14/20 1837  LIPASE 25   No results for input(s): AMMONIA in the last 168 hours. Coagulation Profile: No results for input(s): INR, PROTIME in the last 168 hours. Cardiac Enzymes: No results for input(s): CKTOTAL, CKMB, CKMBINDEX, TROPONINI in the last 168 hours. BNP (last 3  results) No results for input(s): PROBNP in the last 8760 hours. HbA1C: No results for input(s): HGBA1C in the last 72 hours. CBG: No results for input(s): GLUCAP in the last 168 hours. Lipid Profile: No results for input(s): CHOL, HDL, LDLCALC, TRIG, CHOLHDL, LDLDIRECT in the last 72 hours. Thyroid Function Tests: No results for input(s): TSH, T4TOTAL, FREET4, T3FREE, THYROIDAB in the last 72 hours. Anemia Panel: No results for input(s): VITAMINB12, FOLATE, FERRITIN, TIBC, IRON, RETICCTPCT in the last 72 hours. Sepsis Labs: No results for input(s): PROCALCITON, LATICACIDVEN in the last 168 hours.  Recent Results (from the past 240 hour(s))  SARS Coronavirus 2 by RT PCR (hospital order, performed in [facility] hospital lab) Nasopharyngeal Nasopharyngeal Swab     Status: None   Collection Time: 02/14/20  5:46 PM   Specimen: Nasopharyngeal Swab  Result Value Ref Range Status   SARS Coronavirus 2 NEGATIVE NEGATIVE Final    Comment: (NOTE) SARS-CoV-2 target nucleic acids are NOT DETECTED.  The SARS-CoV-2 RNA is generally detectable in upper and lower respiratory specimens during the acute phase of infection. The lowest concentration of SARS-CoV-2 viral copies this assay can detect is 250 copies / mL. A negative result does not preclude SARS-CoV-2 infection and should not be used as the sole basis for treatment or other patient management decisions.  A negative result may occur with improper specimen collection / handling, submission of specimen other than nasopharyngeal swab, presence of viral mutation(s) within the areas  targeted by this assay, and inadequate number of viral copies (<250 copies / mL). A negative result must be combined with clinical observations, patient history, and epidemiological information.  Fact Sheet for Patients:   StrictlyIdeas.no  Fact Sheet for Healthcare Providers: BankingDealers.co.za  This test is not  yet approved or  cleared by the [place] FDA and has been authorized for detection and/or diagnosis of SARS-CoV-2 by FDA under an Emergency Use Authorization (EUA).  This EUA will remain in effect (meaning this test can be used) for the duration of the COVID-19 declaration under Section 564(b)(1) of the Act, 21 U.S.C. section 360bbb-3(b)(1), unless the authorization is terminated or revoked sooner.  Performed at [facility], [place] [address], [place], [place] [address]   Culture, Urine     Status: Abnormal   Collection Time: 02/15/20  9:29 AM   Specimen: Urine, Catheterized  Result Value Ref Range Status   Specimen Description   Final    URINE, CATHETERIZED Performed at [place] [address] [place], [place] [address]    Special Requests   Final    NONE Performed at [facility], [place] [address] [place], [place] [address]    Culture (A)  Final    >=100,000 COLONIES/mL KLEBSIELLA PNEUMONIAE >=100,000 COLONIES/mL CITROBACTER BRAAKII    Report Status 02/17/2020 FINAL  Final   Organism ID, Bacteria KLEBSIELLA PNEUMONIAE (A)  Final   Organism ID, Bacteria CITROBACTER BRAAKII (A)  Final      Susceptibility   Citrobacter braakii - MIC*    CEFAZOLIN >=64 RESISTANT Resistant     CEFTRIAXONE <=0.25 SENSITIVE Sensitive     CIPROFLOXACIN <=0.25 SENSITIVE Sensitive     GENTAMICIN <=1 SENSITIVE Sensitive     IMIPENEM 1 SENSITIVE Sensitive     NITROFURANTOIN <=16 SENSITIVE Sensitive     TRIMETH/SULFA <=20 SENSITIVE Sensitive     PIP/TAZO <=4 SENSITIVE Sensitive     * >=100,000 COLONIES/mL CITROBACTER BRAAKII   Klebsiella pneumoniae - MIC*    AMPICILLIN >=32 RESISTANT Resistant     CEFAZOLIN <=4 SENSITIVE Sensitive     CEFTRIAXONE <=0.25 SENSITIVE Sensitive     CIPROFLOXACIN <=0.25 SENSITIVE Sensitive     GENTAMICIN <=1 SENSITIVE Sensitive     IMIPENEM <=0.25 SENSITIVE Sensitive     NITROFURANTOIN 256 RESISTANT  Resistant     TRIMETH/SULFA <=20 SENSITIVE Sensitive     AMPICILLIN/SULBACTAM 8 SENSITIVE Sensitive     PIP/TAZO <=4 SENSITIVE Sensitive     * >=100,000 COLONIES/mL KLEBSIELLA PNEUMONIAE         Radiology Studies: CARDIAC CATHETERIZATION  Result Date: 02/16/2020  Hemodynamic findings consistent with mild (borderline) pulmonary hypertension.  LV end diastolic pressure is normal.  Ost Cx to Prox Cx lesion is 55% stenosed. -> Somewhat ulcerated mixed plaque on IVUS-estimated 50-60%, not flow-limiting by FFR (0.98),  Ost LAD to Prox LAD lesion is 30% stenosed.  Prox RCA lesion is 20% stenosed.  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  Right heart cath pressures show PCWP 18 mmHg with V wave of 24 mmHg from MR, LVEDP 14 mmHg.  PA diastolic 26 mm;  Cardiac Output-Index: 6.88-3.8 Glenetta Hew, MD   Scheduled Meds: . ammonium lactate   Topical BID  . atorvastatin  80 mg Oral QHS  . cefdinir  300 mg Oral Q12H  . clopidogrel  75 mg Oral Q breakfast  . magnesium oxide  400 mg Oral BID  . [START ON 02/19/2020] metoprolol succinate  25 mg Oral Daily  . potassium chloride  40 mEq Oral Daily  . rivaroxaban  20 mg Oral Q supper  . sodium chloride flush  3 mL Intravenous Q12H  . sodium chloride flush  3 mL Intravenous Q12H  . sodium chloride flush  3 mL Intravenous Q12H   Continuous Infusions: . sodium chloride    . sodium chloride       LOS: 4 days   Time spent= 35 mins    Nita Sells, MD [facility]  If 7PM-7AM, please contact night-coverage  02/18/2020, 9:33 AM

## 2020-02-18 NOTE — Progress Notes (Signed)
Pt husband, [name], called to inform of transport being called for pt discharge. Discharge instructions also reviewed with him and all questions answered at this time. PTAR called for pick up at 1330.

## 2020-02-18 NOTE — Progress Notes (Signed)
Progress Note  Patient Name: Valerie [name] Date of Encounter: 02/18/2020  [facility] Cardiologist: Buford Dresser, MD   Subjective   Followed up on patient due to concern for VT, see below. She is feeling well, tolerating medications. She is determined to be able to walk again. No chest pain, breathing is stable.  Inpatient Medications    Scheduled Meds: . ammonium lactate   Topical BID  . atorvastatin  80 mg Oral QHS  . cefdinir  300 mg Oral Q12H  . clopidogrel  75 mg Oral Q breakfast  . magnesium oxide  400 mg Oral BID  . metoprolol succinate  25 mg Oral Daily  . potassium chloride  40 mEq Oral Daily  . rivaroxaban  20 mg Oral Q supper  . sodium chloride flush  3 mL Intravenous Q12H  . sodium chloride flush  3 mL Intravenous Q12H  . sodium chloride flush  3 mL Intravenous Q12H   Continuous Infusions: . sodium chloride    . sodium chloride     PRN Meds: sodium chloride, sodium chloride, acetaminophen, ipratropium-albuterol, ondansetron (ZOFRAN) IV, polyethylene glycol, senna-docusate, sodium chloride flush, sodium chloride flush   Vital Signs    Vitals:   02/18/20 0100 02/18/20 0531 02/18/20 0917 02/18/20 0958  BP: (!) 94/56 119/66 101/68 100/64  Pulse: 98 96 (!) 110 (!) 107  Resp: 14 20 (!) 24   Temp: 98.3 F (36.8 C) 98.3 F (36.8 C) (!) 97.5 F (36.4 C)   TempSrc: Oral Oral Oral   SpO2: 100% 98% 99%   Weight:      Height:        Intake/Output Summary (Last 24 hours) at 02/18/2020 1053 Last data filed at 02/18/2020 0830 Gross per 24 hour  Intake 410 ml  Output 650 ml  Net -240 ml   Last 3 Weights 02/16/2020 02/15/2020 02/15/2020  Weight (lbs) 153 lb 7 oz 153 lb 7 oz 153 lb 7 oz  Weight (kg) 69.6 kg 69.6 kg 69.6 kg      Telemetry    There are several episodes labeled as VT on telemetry. I personally reviewed these. They are sinus tachycardia with conduction delay. No sustained VT seen. Rare PVCs - Personally Reviewed  ECG    No new  since 6/29 - Personally Reviewed  Physical Exam   GEN: Frail, pleasant woman in no acute distress.   Neck: No JVD visible Cardiac: slightly tachycardic but regular S1 and S2, 2/6 SM Respiratory: Clear to auscultation bilaterally anteriorly GI: Soft, nontender, non-distended  MS: No edema Neuro:  Nonfocal  Psych: Normal affect   Labs    High Sensitivity Troponin:   Recent Labs  Lab 02/14/20 1837 02/15/20 0758 02/15/20 0920  TROPONINIHS 111* 109* 106*      Chemistry Recent Labs  Lab 02/14/20 1837 02/15/20 0551 02/16/20 2308 02/17/20 0635 02/18/20 0703  NA 140   < > 139 140 136  K 3.3*   < > 3.8 3.5 3.5  CL 107   < > 102 100 102  CO2 25   < > 27 29 26   GLUCOSE 110*   < > 122* 98 102*  BUN 11   < > 13 13 15   CREATININE 0.68   < > 0.93 0.99 0.85  CALCIUM 8.7*   < > 9.0 9.4 9.3  PROT 5.9*  --   --   --   --   ALBUMIN 3.2*  --   --   --   --  AST 17  --   --   --   --   ALT 10  --   --   --   --   ALKPHOS 55  --   --   --   --   BILITOT 0.9  --   --   --   --   GFRNONAA >60   < > >60 56* >60  GFRAA >60   < > >60 >60 >60  ANIONGAP 8   < > 10 11 8    < > = values in this interval not displayed.     Hematology Recent Labs  Lab 02/16/20 0513 02/16/20 1001 02/16/20 1006 02/17/20 0635 02/18/20 0703  WBC 4.9  --   --  5.2 6.3  RBC 3.58*  --   --  3.94 3.77*  HGB 10.3*   < > 11.2* 11.5* 11.1*  HCT 32.7*   < > 33.0* 35.7* 35.4*  MCV 91.3  --   --  90.6 93.9  MCH 28.8  --   --  29.2 29.4  MCHC 31.5  --   --  32.2 31.4  RDW 15.6*  --   --  15.6* 15.6*  PLT 242  --   --  292 270   < > = values in this interval not displayed.    BNP Recent Labs  Lab 02/14/20 1746  BNP 985.5*     DDimer  Recent Labs  Lab 02/15/20 0550  DDIMER 0.99*     Radiology    No results found.  Cardiac Studies   Echocardiogram 02/15/20: 1. Left ventricular ejection fraction, by estimation, is 20 to 25%. The  left ventricle has severely decreased function. The left  ventricle  demonstrates global hypokinesis. Left ventricular diastolic parameters are  consistent with Grade I diastolic  dysfunction (impaired relaxation).  2. Right ventricular systolic function is normal. The right ventricular  size is normal. There is mildly elevated pulmonary artery systolic  pressure.  3. The mitral valve is abnormal. Mild to moderate mitral valve  regurgitation.  4. The aortic valve is grossly normal. Aortic valve regurgitation is  mild.  5. The inferior vena cava is dilated in size with >50% respiratory  variability, suggesting right atrial pressure of 8 mmHg.   Right/left heart catheterization 02/16/20:  Hemodynamic findings consistent with mild (borderline) pulmonary hypertension.  LV end diastolic pressure is normal.  Ost Cx to Prox Cx lesion is 55% stenosed. -> Somewhat ulcerated mixed plaque on IVUS-estimated 50-60%, not flow-limiting by FFR (0.98),  Ost LAD to Prox LAD lesion is 30% stenosed.  Prox RCA lesion is 20% stenosed.  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  Right heart cath pressures show PCWP 18 mmHg with V wave of 24 mmHg from MR, LVEDP 14 mmHg. PA diastolic 26 mm;  Cardiac Output-Index: 6.88-3.8  Patient Profile     74 y.o. female with a PMH of metastatic NSCLC (diagnosed 22 yr ago, on oral chemo x10 years), history of DVT on anticoagulation, bedbound since 05/2019 following a LLE fracture, who is being followed by cardiology for acute combined CHF.   Assessment & Plan    Concern for VT on monitor: personally reviewed. These are sinus tachycardia. We did discuss that low EF can predispose  to ventricular arrhythmias, but with being bedbound, nonischemic  cardiomyopathy, do not think lifevest is a great option for her.  -discussed we will follow up on EF as an outpatient -blood pressure improved, agree with metoprolol succinate 25 mg daily. -avoid hypokalemia  Per yesterday's sign off note: Nonobstructive CAD: -Was on no antiplatelet prior to admission, only rivaroxaban -would stop aspirin, continue clopidogrel for 6 mos given findings, and continue rivaroxaban for DVT history -continue atorvastatin 80 mg daily, repeat lipids/LFTs as an outpatient.  Nonischemic cardiomyopathy: -tagrisso stopped per oncology -unable to use ACEi/ARB/ARNI/MRA due to hypotension currently. Re-evaluate as an outpatient -metoprolol succinate increased to 25 mg today, agree with this as BP improved -appears euvolemic, right heart cath numbers not severely elevated -would use lasix only if she develops shortness of breath or LE edema prior to follow up. Instructed to call our office if this occurs -likely repeat echo in 3 mos.  For questions or updates, please contact [place] Please consult [email or web address] for contact info under     Signed, Buford Dresser, MD  02/18/2020, 10:53 AM

## 2020-02-19 ENCOUNTER — Ambulatory Visit: Payer: Medicare PPO | Admitting: Podiatry

## 2020-02-19 ENCOUNTER — Other Ambulatory Visit: Payer: Self-pay | Admitting: Medical Oncology

## 2020-02-19 DIAGNOSIS — C3490 Malignant neoplasm of unspecified part of unspecified bronchus or lung: Secondary | ICD-10-CM

## 2020-02-19 NOTE — Progress Notes (Signed)
Cardiology Office Note   Date:  02/24/2020   ID:  Valerie, [name] 1946-07-30, MRN [id number]  PCP:  [name], MD Cardiologist:  Buford Dresser, MD 02/16/2020 in-hospital Electrphysiologist: None Rosaria Ferries, PA-C   No chief complaint on file.   History of Present Illness: Valerie [name] is a 74 y.o. female with a history of metastatic NSCLC (diagnosed 22 yr ago, on oral chemo x10 years), history of DVT on anticoagulation, bedbound since 10/2020following a LLE fracture, thyroidectomy  Pt admitted 06/27-07/01 for SOB, dx with acute combined CHF and Afib in setting of UTI. EF 20-25%, med rx for 50% CFX at cath  Valerie [name] presents for cardiology follow up.   She is in a wheelchair chronically. She is unable to stand and be weighed.   She has no palpitations, no awareness of any atrial fib.  Her husband (retired MD) takes excellent care of her. He makes sure she does not get excess salt. He is a little concerned about her oral intake, thinks she does not drink enough. From the description, she drinks about a quart of water a day.  She has no LE edema. He is encouraged to check for dependent edema, but she has not had any.   Her husband monitors her condition carefully. He likes virtual visits  She has been taken of her previous chemo, CM is a side effect of that. He is concerned about the chemo she has been changed to, it was previously ineffective against her CA.   She has not had any chest pain.   Her husband is concerned about her ECG, it is different from previous, but actually looks improved in some ways.  PCP took her off the Toprol XL 25 mg qd due to hypotensions. When SBP was < 95, she felt bad, but tolerated SBP approx 100 well.    Past Medical History:  Diagnosis Date  . Abnormal Pap smear 2006  . Anal fistula   . Ankle fracture   . Anxiety   . Arthritis   . Atrophic vaginitis 2008  . Cataract   . Dementia ([place]) 2009    . Dyspareunia 2008  . H/O osteoporosis   . H/O varicella   . H/O vitamin D deficiency   . Headache 07/24/2016  . Heart murmur   . History of measles, mumps, or rubella   . History of radiation therapy 07/28/13- 08/10/13   right lung metastasis 5000 cGy 10 sessions  . Hypertension   . Lung cancer ([place]) dx'd 2002  . Lung cancer ([place])   . Lung cancer ([place])   . Lung metastasis ([place])    PET scan 05/05/13, RUL lung nodule  . 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 Nodule of right lung CT- 06/03/12   RIGHT UPPER LOBE  . Nodule of right lung 06/03/12   Upper Lobe  . On antineoplastic chemotherapy    TARCEVA  . Osteoporosis 2010  . Primary cancer of right upper lobe of lung ([place]) 04/22/2009   Qualifier: Diagnosis of  By: Nils Pyle CMA ([place]), Mearl Latin    . Shortness of breath    hx lung ca   . Status post chemotherapy 2003   CARBOPLATIN/PACLITAXEL /STATUS POST CLINICAL TRAIL OF CELEBREX AND IRESSA AT [facility] FOR 1 YEAR  . Status post radiation therapy 2003   LEFT LUNG  . Status post radiation therapy 11/07/2005   WHOLE BRAIN: DR Larkin [facility]  .  Status post radiation therapy 06/02/2008   GAMMA KNIFE OF RESECTED CAVITAY  . Thyroid adenoma    ?  [name] Thyroid cancer ([place]) 10/18/11 bx   adenoid nodules   . Yeast infection     Past Surgical History:  Procedure Laterality Date  . BRAIN SURGERY    . INTRAVASCULAR ULTRASOUND/IVUS N/A 02/16/2020   Procedure: Intravascular Ultrasound/IVUS;  Surgeon: Leonie Man, MD;  Location: [place] [facility];  Service: Cardiovascular;  Laterality: N/A;  . LEFT LOWER LOBECTOMY  05/2001  . LEFT OCCIPITAL CRANIOTOMY  09/21/2005   tumor  . LUNG LOBECTOMY    . NECK SURGERY    . ORIF ANKLE FRACTURE Left 10/02/2014   Procedure: OPEN REDUCTION INTERNAL FIXATION (ORIF) ANKLE FRACTURE;  Surgeon: Alta Corning, MD;  Location: [facility];  Service: Orthopedics;  Laterality: Left;  . ORIF FEMUR FRACTURE Right 06/11/2019    Procedure: OPEN REDUCTION INTERNAL FIXATION (ORIF) DISTAL FEMUR FRACTURE;  Surgeon: Renette Butters, MD;  Location: [facility];  Service: Orthopedics;  Laterality: Right;  . ORIF TIBIA PLATEAU Right 06/11/2019   Procedure: Open Reduction Internal Fixation (Orif) Tibial Plateau;  Surgeon: Renette Butters, MD;  Location: [facility];  Service: Orthopedics;  Laterality: Right;  . RADICAL NECK DISSECTION  10/18/2011   Procedure: RADICAL NECK DISSECTION;  Surgeon: Izora Gala, MD;  Location: [facility];  Service: ENT;  Laterality: Right;  RIGHT MODIFIED NECK DISSECTION /POSSIBLE RIGHT THYROIDECTOM  . RIGHT/LEFT HEART CATH AND CORONARY ANGIOGRAPHY N/A 02/16/2020   Procedure: RIGHT/LEFT HEART CATH AND CORONARY ANGIOGRAPHY;  Surgeon: Leonie Man, MD;  Location: [place] [facility];  Service: Cardiovascular;  Laterality: N/A;  . THYROIDECTOMY  10/18/2011   Procedure: THYROIDECTOMY WITH RADICAL NECK DISSECTION;  Surgeon: Izora Gala, MD;  Location: [facility];  Service: ENT;  Laterality: Right;    Current Outpatient Medications  Medication Sig Dispense Refill  . ammonium lactate (LAC-HYDRIN) 12 % lotion Apply topically 2 (two) times daily. 400 g 0  . Ascorbic Acid (VITAMIN C) 100 MG tablet Take 100 mg by mouth every evening. Gummie    . atorvastatin (LIPITOR) 80 MG tablet Take 1 tablet (80 mg total) by mouth at bedtime. 30 tablet 0  . clopidogrel (PLAVIX) 75 MG tablet Take 1 tablet (75 mg total) by mouth daily with breakfast. 30 tablet 6  . MAGNESIUM PO Take 1 tablet by mouth daily.     . Multiple Vitamin (MULTI-VITAMINS) TABS Take 1 tablet by mouth every evening.     . polyethylene glycol (MIRALAX / GLYCOLAX) 17 g packet Take 17 g by mouth daily. 14 each 0  . potassium chloride SA (KLOR-CON) 20 MEQ tablet Take 2 tablets (40 mEq total) by mouth daily. 20 tablet 0  . vitamin B-12 (CYANOCOBALAMIN) 1000 MCG tablet Take 1,000 mcg by mouth every evening.    [name] 20 MG TABS tablet Take 1 tablet (20 mg total) by mouth  every evening. 90 tablet 1  . erlotinib (TARCEVA) 150 MG tablet Take 1 tablet (150 mg total) by mouth daily. Take on an empty stomach 1 hour before meals or 2 hours after. (Patient not taking: Reported on 02/24/2020) 30 tablet 1  . metoprolol succinate (TOPROL-XL) 25 MG 24 hr tablet Take 1 tablet (25 mg total) by mouth daily. (Patient not taking: Reported on 02/24/2020) 30 tablet 2   No current facility-administered medications for this visit.    Allergies:   Keflex [cephalexin]    Social History:  The patient  reports that she has never smoked. She has never used smokeless tobacco. She reports that she does not drink alcohol and does not use drugs.   Family History:  The patient's family history includes Cancer in her father, maternal aunt, and mother; Gait disorder in her mother; Heart failure in her sister.  She indicated that her mother is deceased. She indicated that her father is deceased. She indicated that her sister is alive. She indicated that the status of her maternal aunt is unknown.   ROS:  Please see the history of present illness. All other systems are reviewed and negative.    PHYSICAL EXAM: VS:  BP 99/67   Pulse (!) 106   Ht 5\' 7"  (1.702 m)   Wt 163 lb (73.9 kg)   SpO2 98%   BMI 25.53 kg/m  , BMI Body mass index is 25.53 kg/m. GEN: Well nourished, well developed, female in no acute distress HEENT: normal for age  Neck: no JVD, no carotid bruit, no masses Cardiac: RRR; no murmur, no rubs, or gallops Respiratory:  clear to auscultation bilaterally, normal work of breathing GI: soft, nontender, nondistended, + BS MS: no deformity or atrophy; no edema; distal pulses are 2+ in all 4 extremities  Skin: warm and dry, no rash Neuro:  Strength and sensation are intact Psych: euthymic mood, full affect   EKG:  EKG is ordered today. The ekg ordered today demonstrates ST, HR 111, LBBB w/ QRS 124 ms, this is similar to ECG 06/28 w/ HR 106, but different from 06/29 ECG that  was SR, HR 83, T wave inversions in V1-2 (? Rate-related LBBB)  Echocardiogram 02/15/20: 1. Left ventricular ejection fraction, by estimation, is 20 to 25%. The  left ventricle has severely decreased function. The left ventricle  demonstrates global hypokinesis. Left ventricular diastolic parameters are  consistent with Grade I diastolic  dysfunction (impaired relaxation).  2. Right ventricular systolic function is normal. The right ventricular  size is normal. There is mildly elevated pulmonary artery systolic  pressure.  3. The mitral valve is abnormal. Mild to moderate mitral valve  regurgitation.  4. The aortic valve is grossly normal. Aortic valve regurgitation is  mild.  5. The inferior vena cava is dilated in size with >50% respiratory  variability, suggesting right atrial pressure of 8 mmHg.   Right/left heart catheterization 02/16/20:  Hemodynamic findings consistent with mild (borderline) pulmonary hypertension.  LV end diastolic pressure is normal.  Ost Cx to Prox Cx lesion is 55% stenosed. -> Somewhat ulcerated mixed plaque on IVUS-estimated 50-60%, not flow-limiting by FFR (0.98),  Ost LAD to Prox LAD lesion is 30% stenosed.  Prox RCA lesion is 20% stenosed.  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  Right heart cath pressures show PCWP 18 mmHg with V wave of 24 mmHg from MR, LVEDP 14 mmHg. PA diastolic 26 mm;  Cardiac Output-Index: 6.88-3.8  Recent Labs: 02/14/2020: B Natriuretic Peptide 985.5 02/15/2020: TSH 2.095 02/18/2020: Magnesium 2.2 02/23/2020: ALT 26; BUN 12; Creatinine 0.90; Hemoglobin 11.4; Platelet Count 280; Potassium 3.6; Sodium 141  CBC    Component Value Date/Time   WBC  6.8 02/23/2020 1006   WBC 6.7 02/20/2020 1448   RBC 3.92 02/23/2020 1006   HGB 11.4 (L) 02/23/2020 1006  HGB 12.9 10/16/2018 1300   HGB 13.1 08/06/2017 1047   HCT 37.0 02/23/2020 1006   HCT 38.4 10/16/2018 1300   HCT 40.9 08/06/2017 1047   PLT 280 02/23/2020 1006   PLT 227 10/16/2018 1300   MCV 94.4 02/23/2020 1006   MCV 88 10/16/2018 1300   MCV 91.9 08/06/2017 1047   MCH 29.1 02/23/2020 1006   MCHC 30.8 02/23/2020 1006   RDW 15.1 02/23/2020 1006   RDW 13.2 10/16/2018 1300   RDW 14.7 (H) 08/06/2017 1047   LYMPHSABS 1.1 02/23/2020 1006   LYMPHSABS 0.7 (L) 08/06/2017 1047   MONOABS 0.6 02/23/2020 1006   MONOABS 0.3 08/06/2017 1047   EOSABS 0.3 02/23/2020 1006   EOSABS 0.1 08/06/2017 1047   BASOSABS 0.0 02/23/2020 1006   BASOSABS 0.0 08/06/2017 1047   CMP Latest Ref Rng & Units 02/23/2020 02/20/2020 02/18/2020  Glucose 70 - 99 mg/dL 112(H) 121(H) 102(H)  BUN 8 - 23 mg/dL 12 16 15   Creatinine 0.44 - 1.00 mg/dL 0.90 1.03(H) 0.85  Sodium 135 - 145 mmol/L 141 137 136  Potassium 3.5 - 5.1 mmol/L 3.6 3.6 3.5  Chloride 98 - 111 mmol/L 108 103 102  CO2 22 - 32 mmol/L 23 21(L) 26  Calcium 8.9 - 10.3 mg/dL 9.9 9.2 9.3  Total Protein 6.5 - 8.1 g/dL 6.8 - -  Total Bilirubin 0.3 - 1.2 mg/dL 0.4 - -  Alkaline Phos 38 - 126 U/L 75 - -  AST 15 - 41 U/L 14(L) - -  ALT 0 - 44 U/L 26 - -   Lab Results  Component Value Date   TSH 2.095 02/15/2020    Lipid Panel Lab Results  Component Value Date   CHOL 242 (H) 02/15/2020   HDL 68 02/15/2020   LDLCALC 155 (H) 02/15/2020   TRIG 97 02/15/2020   CHOLHDL 3.6 02/15/2020      Wt Readings from Last 3 Encounters:  02/24/20 163 lb (73.9 kg)  02/23/20 163 lb (73.9 kg)  02/16/20 153 lb 7 oz (69.6 kg)     Other studies Reviewed: Additional studies/ records that were reviewed today include: Office notes, hospital records and testing.  ASSESSMENT AND PLAN:  1.  Chronic systolic CHF - her volume status is good by exam. - she is not  currently on a diuretic, start Lasix 20 mg qd prn for volume overload - she was taken off the  BB due to hypotension, gave her husband parameters to restart it at 1/2 tab daily, if SBP > 100 - discussed need to drink at least 1 quart/liter daily - hopefully, she will get a little stronger - discussed that the CM may have come from previous chemo, Tagresso.  - husband is concerned that she is too dry, but very recent labs show stable renal function  2. Lung CA - pt changed from [place] to [place] - looked up the Tarceva and was able to advise him that CM is not a known side effect of this med.  - Oncology MD to monitor for effectivieness  3. Hypotension - BP has never been high, currently 90s - she tolerates this well, no sx w/ SBP 90s - continue to follow, add CHF meds as BP will tolerate  4. PAF - currently in SR - no palpitations or other arrhythmia sx - add BB as HR/BP will tolerate   Current medicines are reviewed at length with the patient today.  The patient has concerns regarding medicines. Concerns were addressed  The  following changes have been made:  Restart metoprolol  Labs/ tests ordered today include:   Orders Placed This Encounter  Procedures  . EKG 12-Lead     Disposition:   FU with Buford Dresser, MD  Signed, Rosaria Ferries, PA-C  02/24/2020 10:39 PM    [place] Phone: [phone]; Fax: [phone]

## 2020-02-19 NOTE — Telephone Encounter (Signed)
Patient contacted regarding discharge from [facility] on 02/18/2020.  Patient understands to follow up with provider Rosaria Ferries PA on 02/24/2020 at 3:15pm at [facility]. Patient understands discharge instructions? yes Patient understands medications and regiment? yes Patient understands to bring all medications to this visit? yes

## 2020-02-20 ENCOUNTER — Other Ambulatory Visit
Admission: RE | Admit: 2020-02-20 | Discharge: 2020-02-20 | Disposition: A | Discharge status: Home / Self Care | Payer: Medicare PPO | Source: Ambulatory Visit | Attending: Nurse Practitioner | Admitting: Nurse Practitioner

## 2020-02-20 DIAGNOSIS — I509 Heart failure, unspecified: Secondary | ICD-10-CM | POA: Insufficient documentation

## 2020-02-20 LAB — DIFFERENTIAL
Abs Immature Granulocytes: 0.02 10*3/uL (ref 0.00–0.07)
Basophils Absolute: 0 10*3/uL (ref 0.0–0.1)
Basophils Relative: 0 %
Eosinophils Absolute: 0.3 10*3/uL (ref 0.0–0.5)
Eosinophils Relative: 5 %
Immature Granulocytes: 0 %
Lymphocytes Relative: 15 %
Lymphs Abs: 1 10*3/uL (ref 0.7–4.0)
Monocytes Absolute: 0.6 10*3/uL (ref 0.1–1.0)
Monocytes Relative: 9 %
Neutro Abs: 4.9 10*3/uL (ref 1.7–7.7)
Neutrophils Relative %: 71 %

## 2020-02-20 LAB — CBC
HCT: 35.8 % — ABNORMAL LOW (ref 36.0–46.0)
Hemoglobin: 11.4 g/dL — ABNORMAL LOW (ref 12.0–15.0)
MCH: 28.9 pg (ref 26.0–34.0)
MCHC: 31.8 g/dL (ref 30.0–36.0)
MCV: 90.9 fL (ref 80.0–100.0)
Platelets: 265 10*3/uL (ref 150–400)
RBC: 3.94 MIL/uL (ref 3.87–5.11)
RDW: 15.1 % (ref 11.5–15.5)
WBC: 6.7 10*3/uL (ref 4.0–10.5)
nRBC: 0 % (ref 0.0–0.2)

## 2020-02-20 LAB — BASIC METABOLIC PANEL
Anion gap: 13 (ref 5–15)
BUN: 16 mg/dL (ref 8–23)
CO2: 21 mmol/L — ABNORMAL LOW (ref 22–32)
Calcium: 9.2 mg/dL (ref 8.9–10.3)
Chloride: 103 mmol/L (ref 98–111)
Creatinine, Ser: 1.03 mg/dL — ABNORMAL HIGH (ref 0.44–1.00)
GFR calc Af Amer: >60 mL/min (ref >60)
GFR calc non Af Amer: 54 mL/min — ABNORMAL LOW (ref >60)
Glucose, Bld: 121 mg/dL — ABNORMAL HIGH (ref 70–99)
Potassium: 3.6 mmol/L (ref 3.5–5.1)
Sodium: 137 mmol/L (ref 135–145)

## 2020-02-23 ENCOUNTER — Other Ambulatory Visit: Payer: Self-pay

## 2020-02-23 ENCOUNTER — Encounter: Payer: Self-pay | Admitting: Pharmacist

## 2020-02-23 ENCOUNTER — Inpatient Hospital Stay: Payer: Medicare PPO | Attending: Internal Medicine

## 2020-02-23 ENCOUNTER — Encounter: Payer: Self-pay | Admitting: Internal Medicine

## 2020-02-23 ENCOUNTER — Telehealth: Payer: Self-pay | Admitting: Pharmacist

## 2020-02-23 ENCOUNTER — Inpatient Hospital Stay (HOSPITAL_BASED_OUTPATIENT_CLINIC_OR_DEPARTMENT_OTHER): Payer: Medicare PPO | Admitting: Internal Medicine

## 2020-02-23 ENCOUNTER — Telehealth: Payer: Self-pay

## 2020-02-23 ENCOUNTER — Telehealth: Payer: Self-pay | Admitting: Internal Medicine

## 2020-02-23 VITALS — BP 113/81 | HR 114 | Temp 97.2°F | Resp 18 | Ht 67.0 in | Wt 163.0 lb

## 2020-02-23 DIAGNOSIS — I420 Dilated cardiomyopathy: Secondary | ICD-10-CM

## 2020-02-23 DIAGNOSIS — Z5111 Encounter for antineoplastic chemotherapy: Secondary | ICD-10-CM

## 2020-02-23 DIAGNOSIS — I509 Heart failure, unspecified: Secondary | ICD-10-CM | POA: Insufficient documentation

## 2020-02-23 DIAGNOSIS — Z7901 Long term (current) use of anticoagulants: Secondary | ICD-10-CM | POA: Insufficient documentation

## 2020-02-23 DIAGNOSIS — F0390 Unspecified dementia without behavioral disturbance: Secondary | ICD-10-CM | POA: Diagnosis not present

## 2020-02-23 DIAGNOSIS — Z79899 Other long term (current) drug therapy: Secondary | ICD-10-CM | POA: Insufficient documentation

## 2020-02-23 DIAGNOSIS — C3432 Malignant neoplasm of lower lobe, left bronchus or lung: Secondary | ICD-10-CM | POA: Insufficient documentation

## 2020-02-23 DIAGNOSIS — C3411 Malignant neoplasm of upper lobe, right bronchus or lung: Secondary | ICD-10-CM

## 2020-02-23 DIAGNOSIS — Z85850 Personal history of malignant neoplasm of thyroid: Secondary | ICD-10-CM | POA: Diagnosis not present

## 2020-02-23 DIAGNOSIS — D638 Anemia in other chronic diseases classified elsewhere: Secondary | ICD-10-CM | POA: Diagnosis not present

## 2020-02-23 DIAGNOSIS — C3490 Malignant neoplasm of unspecified part of unspecified bronchus or lung: Secondary | ICD-10-CM

## 2020-02-23 DIAGNOSIS — I110 Hypertensive heart disease with heart failure: Secondary | ICD-10-CM | POA: Diagnosis not present

## 2020-02-23 DIAGNOSIS — C7801 Secondary malignant neoplasm of right lung: Secondary | ICD-10-CM | POA: Diagnosis not present

## 2020-02-23 DIAGNOSIS — I429 Cardiomyopathy, unspecified: Secondary | ICD-10-CM | POA: Diagnosis not present

## 2020-02-23 LAB — CBC WITH DIFFERENTIAL (CANCER CENTER ONLY)
Abs Immature Granulocytes: 0.02 10*3/uL (ref 0.00–0.07)
Basophils Absolute: 0 10*3/uL (ref 0.0–0.1)
Basophils Relative: 1 %
Eosinophils Absolute: 0.3 10*3/uL (ref 0.0–0.5)
Eosinophils Relative: 5 %
HCT: 37 % (ref 36.0–46.0)
Hemoglobin: 11.4 g/dL — ABNORMAL LOW (ref 12.0–15.0)
Immature Granulocytes: 0 %
Lymphocytes Relative: 17 %
Lymphs Abs: 1.1 10*3/uL (ref 0.7–4.0)
MCH: 29.1 pg (ref 26.0–34.0)
MCHC: 30.8 g/dL (ref 30.0–36.0)
MCV: 94.4 fL (ref 80.0–100.0)
Monocytes Absolute: 0.6 10*3/uL (ref 0.1–1.0)
Monocytes Relative: 9 %
Neutro Abs: 4.6 10*3/uL (ref 1.7–7.7)
Neutrophils Relative %: 68 %
Platelet Count: 280 10*3/uL (ref 150–400)
RBC: 3.92 MIL/uL (ref 3.87–5.11)
RDW: 15.1 % (ref 11.5–15.5)
WBC Count: 6.8 10*3/uL (ref 4.0–10.5)
nRBC: 0 % (ref 0.0–0.2)

## 2020-02-23 LAB — CMP (CANCER CENTER ONLY)
ALT: 26 U/L (ref 0–44)
AST: 14 U/L — ABNORMAL LOW (ref 15–41)
Albumin: 3.6 g/dL (ref 3.5–5.0)
Alkaline Phosphatase: 75 U/L (ref 38–126)
Anion gap: 10 (ref 5–15)
BUN: 12 mg/dL (ref 8–23)
CO2: 23 mmol/L (ref 22–32)
Calcium: 9.9 mg/dL (ref 8.9–10.3)
Chloride: 108 mmol/L (ref 98–111)
Creatinine: 0.9 mg/dL (ref 0.44–1.00)
GFR, Est AFR Am: >60 mL/min (ref >60)
GFR, Estimated: >60 mL/min (ref >60)
Glucose, Bld: 112 mg/dL — ABNORMAL HIGH (ref 70–99)
Potassium: 3.6 mmol/L (ref 3.5–5.1)
Sodium: 141 mmol/L (ref 135–145)
Total Bilirubin: 0.4 mg/dL (ref 0.3–1.2)
Total Protein: 6.8 g/dL (ref 6.5–8.1)

## 2020-02-23 MED ORDER — ERLOTINIB HCL 150 MG PO TABS
150.0000 mg | ORAL_TABLET | Freq: Every day | ORAL | 1 refills | Status: DC
Start: 2020-02-23 — End: 2020-04-15

## 2020-02-23 NOTE — Progress Notes (Signed)
[place] Telephone:([phone]) [phone]   Fax:([phone]) [phone]  OFFICE PROGRESS NOTE  [name], [address] [place] [address]  PRINCIPAL DIAGNOSIS:  metastatic non-small cell lung cancer, adenocarcinoma with brain metastases initially diagnosed as a stage IIb in October 2002.   BIOMARKERS TESTING ([facility]): Positive for: EGFR E746-A750 del, T790M, CTNNB1 D32G, SMAD4 T152f*10 Negative for: RET, ALK, BRAF, KRAS, ERBB2 and MET  PRIOR THERAPY:  1. Status post left lower lobectomy under the care of Dr BArlyce Dicein October 2002. 2. Status post course of concurrent chemoradiation with weekly carboplatin and paclitaxel under the care of Dr SBenay Spiceand Dr WElba Barmanin early 2003. 3. Status post [facility] 4. Status post left occipital craniotomy for tumor resection on September 21, 2005, under the care of Dr CChristella Noa 5. Status post whole brain irradiation under the care of Dr WElba Barman completed November 07, 2005. 6. Status post gamma knife treatment [facility] the [facility]cted [facility] 7. Status post right thyroidectomy with radical neck dissection under the care of Dr. RConstance Holsteron 10/18/2011. 8. Status post excisional biopsy of right cervical lymph node that was consistent with metastatic adenocarcinoma. 9. Tarceva 150 mg by mouth daily with therapy beginning 06/03/2007, discontinued in July 2015 secondary to disease progression. Status post 79 cycles. 10. palliative radiotherapy to the enlarging right upper lobe lung nodule under the care of Dr. MValere Dross [facility]. [id number] 300 mg by mouth daily for 21 days discontinued secondary to study deviation and [facility]. 12. Open reduction and  internal fixation of trimalleolar ankle fracture dislocation with fixation of the fibula as well as medial malleolus. 13. Tagrisso 80 mg by mouth daily started 11/25/2014, status post 62 months of treatment.  This was discontinued on 02/13/2020 secondary to congestive heart failure with ejection fraction of 20-25%.   CURRENT THERAPY: Tarceva 150 mg p.o. daily.  Expected to start in the next few days.  INTERVAL HISTORY: Valerie OLBERDING74y.o. female returns to the clinic today for follow-up visit accompanied by her husband.  The patient is feeling fine with no concerning complaints except for the generalized fatigue and weakness.  [facility] and the patient was diagnosed with congestive heart failure and 2D echo showed ejection fraction of 20-25%.  The patient started treatment with metoprolol and diuretics and she is feeling much better.  Her husband discontinued the metoprolol because of hypotension and he will discuss with the cardiologist tomorrow adjustment of her medication.  Her treatment with TNewman Nipwas discontinued on 02/13/2020 because of concern of the adverse reaction leading to the cardiomyopathy.  She denied having any current nausea, vomiting, diarrhea or constipation.  She denied having any current chest pain, shortness of breath, cough or hemoptysis.  She has no fever or chills.  She is here today for evaluation and discussion of her treatment options.  MEDICAL HISTORY: Past Medical History:  Diagnosis Date  . Abnormal Pap smear 2006  . Anal fistula   . Ankle fracture   . Anxiety   . Arthritis   . Atrophic vaginitis 2008  . Cataract   . Dementia ([place]  2009  . Dyspareunia 2008  . H/O osteoporosis   . H/O varicella   . H/O vitamin D deficiency   . Headache 07/24/2016  . Heart murmur   . History of measles, mumps, or rubella   . History of radiation therapy 07/28/13- 08/10/13   right lung metastasis 5000  cGy 10 sessions  . Hypertension   . Lung cancer ([place]) dx'd 2002  . Lung cancer ([place])   . Lung cancer ([place])   . Lung metastasis ([place])    PET scan 05/05/13, RUL lung nodule  . Metastasis to brain [facility]) dx'd 2008  . 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 Nodule of right lung CT- 06/03/12   RIGHT UPPER LOBE  . Nodule of right lung 06/03/12   Upper Lobe  . On antineoplastic chemotherapy    TARCEVA  . Osteoporosis 2010  . Primary cancer of right upper lobe of lung ([place]) 04/22/2009   Qualifier: Diagnosis of  By: Nils Pyle CMA ([place]), Mearl Latin    . Shortness of breath    hx lung ca   . Status post chemotherapy 2003   CARBOPLATIN/PACLITAXEL /STATUS POST CLINICAL TRAIL OF CELEBREX AND IRESSA AT [facility] FOR 1 YEAR  . Status post radiation therapy 2003   LEFT LUNG  . Status post radiation therapy 11/07/2005   WHOLE BRAIN: DR Larkin Ina [facility]  . Status post radiation therapy 06/02/2008   GAMMA KNIFE OF RESECTED CAVITAY  . Thyroid adenoma    ?  [name] Thyroid cancer ([facility]) 10/18/11 bx   adenoid nodules   . Yeast infection     ALLERGIES:  is allergic to keflex [cephalexin].  MEDICATIONS:  Current Outpatient Medications  Medication Sig Dispense Refill  . ammonium lactate (LAC-HYDRIN) 12 % lotion Apply topically 2 (two) times daily. 400 g 0  . Ascorbic Acid (VITAMIN C) 100 MG tablet Take 100 mg by mouth every evening. Gummie    . atorvastatin (LIPITOR) 80 MG tablet Take 1 tablet (80 mg total) by mouth at bedtime. 30 tablet 0  . cefdinir (OMNICEF) 300 MG capsule Take 1 capsule (300 mg total) by mouth every 12 (twelve) hours. 2 capsule 0  . clopidogrel (PLAVIX) 75 MG tablet Take 1 tablet (75 mg total) by mouth daily with breakfast. 30 tablet 6  . MAGNESIUM PO Take 1 tablet by mouth daily. (Patient not taking: Reported on 02/14/2020)    . metoprolol succinate (TOPROL-XL) 25 MG 24 hr tablet Take 1 tablet (25 mg total) by mouth daily. 30 tablet 2  . Multiple Vitamin (MULTI-VITAMINS) TABS Take 1 tablet by  mouth every evening.     . polyethylene glycol (MIRALAX / GLYCOLAX) 17 g packet Take 17 g by mouth daily. (Patient taking differently: Take 17 g by mouth daily as needed for moderate constipation. ) 14 each 0  . potassium chloride SA (KLOR-CON) 20 MEQ tablet Take 2 tablets (40 mEq total) by mouth daily. 20 tablet 0  . TAGRISSO 80 MG tablet TAKE 1 TABLET (80MG) BY MOUTH DAILY. (Patient taking differently: Take 80 mg by mouth daily. ) 30 tablet 2  . vitamin B-12 (CYANOCOBALAMIN) 1000 MCG tablet Take 1,000 mcg by mouth every evening.    [name] 20 MG TABS tablet Take 1 tablet (20 mg total) by mouth every evening. 90 tablet 1   No current facility-administered medications for this visit.    SURGICAL HISTORY:  Past Surgical History:  Procedure Laterality Date  . BRAIN SURGERY    . INTRAVASCULAR ULTRASOUND/IVUS N/A  02/16/2020   Procedure: Intravascular Ultrasound/IVUS;  Surgeon: Leonie Man, MD;  Location: [place] CV LAB;  Service: Cardiovascular;  Laterality: N/A;  . LEFT LOWER LOBECTOMY  05/2001  . LEFT OCCIPITAL CRANIOTOMY  09/21/2005   tumor  . LUNG LOBECTOMY    . NECK SURGERY    . ORIF ANKLE FRACTURE Left 10/02/2014   Procedure: OPEN REDUCTION INTERNAL FIXATION (ORIF) ANKLE FRACTURE;  Surgeon: Alta Corning, MD;  Location: [facility];  Service: Orthopedics;  Laterality: Left;  . ORIF FEMUR FRACTURE Right 06/11/2019   Procedure: OPEN REDUCTION INTERNAL FIXATION (ORIF) DISTAL FEMUR FRACTURE;  Surgeon: Renette Butters, MD;  Location: [place];  Service: Orthopedics;  Laterality: Right;  . ORIF TIBIA PLATEAU Right 06/11/2019   Procedure: Open Reduction Internal Fixation (Orif) Tibial Plateau;  Surgeon: Renette Butters, MD;  Location: [place];  Service: Orthopedics;  Laterality: Right;  . RADICAL NECK DISSECTION  10/18/2011   Procedure: RADICAL NECK DISSECTION;  Surgeon: Izora Gala, MD;  Location: [place];  Service: ENT;  Laterality: Right;  RIGHT MODIFIED NECK DISSECTION /POSSIBLE RIGHT  THYROIDECTOM  . RIGHT/LEFT HEART CATH AND CORONARY ANGIOGRAPHY N/A 02/16/2020   Procedure: RIGHT/LEFT HEART CATH AND CORONARY ANGIOGRAPHY;  Surgeon: Leonie Man, MD;  Location: [place] CV LAB;  Service: Cardiovascular;  Laterality: N/A;  . THYROIDECTOMY  10/18/2011   Procedure: THYROIDECTOMY WITH RADICAL NECK DISSECTION;  Surgeon: Izora Gala, MD;  Location: [place];  Service: ENT;  Laterality: Right;    REVIEW OF SYSTEMS:  Constitutional: positive for fatigue Eyes: negative Ears, nose, mouth, throat, and face: negative Respiratory: positive for dyspnea on exertion Cardiovascular: negative Gastrointestinal: negative Genitourinary:negative Integument/breast: negative Hematologic/lymphatic: negative Musculoskeletal:positive for muscle weakness Neurological: positive for weakness Behavioral/Psych: negative Endocrine: negative Allergic/Immunologic: negative   PHYSICAL EXAMINATION: General appearance: alert, cooperative, fatigued and no distress Head: Normocephalic, without obvious abnormality, atraumatic Neck: no adenopathy, supple, symmetrical, trachea midline and thyroid not enlarged, symmetric, no tenderness/mass/nodules Lymph nodes: Cervical, supraclavicular, and axillary nodes normal. Resp: clear to auscultation bilaterally Back: symmetric, no curvature. ROM normal. No CVA tenderness. Cardio: regular rate and rhythm, S1, S2 normal, no murmur, click, rub or gallop GI: soft, non-tender; bowel sounds normal; no masses,  no organomegaly Extremities: extremities normal, atraumatic, no cyanosis or edema Neurologic: Motor: Weakness of the lower extremities bilaterally  ECOG PERFORMANCE STATUS: 1 - Symptomatic but completely ambulatory  Blood pressure 113/81, pulse (!) 114, temperature (!) 97.2 F (36.2 C), temperature source Temporal, resp. rate 18, height _0  (1.702 m), weight 163 lb (73.9 kg), SpO2 100 %.  LABORATORY DATA: Lab Results  Component Value Date   WBC 6.8  02/23/2020   HGB 11.4 (L) 02/23/2020   HCT 37.0 02/23/2020   MCV 94.4 02/23/2020   PLT 280 02/23/2020      Chemistry      Component Value Date/Time   NA 137 02/20/2020 1448   NA 148 (H) 10/16/2018 1300   NA 141 08/06/2017 1047   K 3.6 02/20/2020 1448   K 3.9 08/06/2017 1047   CL 103 02/20/2020 1448   CL 106 12/09/2012 0806   CO2 21 (L) 02/20/2020 1448   CO2 24 08/06/2017 1047   BUN 16 02/20/2020 1448   BUN 16 10/16/2018 1300   BUN 15.4 08/06/2017 1047   CREATININE 1.03 (H) 02/20/2020 1448   CREATININE 1.07 (H) 12/29/2019 1302   CREATININE 1.0 08/06/2017 1047      Component Value Date/Time   CALCIUM 9.2 02/20/2020 1448  CALCIUM 9.8 08/06/2017 1047   ALKPHOS 55 02/14/2020 1837   ALKPHOS 65 08/06/2017 1047   AST 17 02/14/2020 1837   AST 9 (L) 12/29/2019 1302   AST 11 08/06/2017 1047   ALT 10 02/14/2020 1837   ALT 7 12/29/2019 1302   ALT 9 08/06/2017 1047   BILITOT 0.9 02/14/2020 1837   BILITOT 0.4 12/29/2019 1302   BILITOT 0.66 08/06/2017 1047       RADIOGRAPHIC STUDIES: DG Chest 2 View  Result Date: 02/14/2020 CLINICAL DATA:  Shortness of breath.  Lung cancer. EXAM: CHEST - 2 VIEW COMPARISON:  06/16/2019 FINDINGS: Cardiomegaly, vascular congestion. Small left pleural effusion with left lower lobe atelectasis or infiltrate. Postoperative changes on the left. No acute bony abnormality. IMPRESSION: Cardiomegaly with vascular congestion. Small left pleural effusion with left base atelectasis or infiltrate. Electronically Signed   By: Rolm Baptise M.D.   On: 02/14/2020 17:49   CARDIAC CATHETERIZATION  Result Date: 02/16/2020  Hemodynamic findings consistent with mild (borderline) pulmonary hypertension.  LV end diastolic pressure is normal.  Ost Cx to Prox Cx lesion is 55% stenosed. -> Somewhat ulcerated mixed plaque on IVUS-estimated 50-60%, not flow-limiting by FFR (0.98),  Ost LAD to Prox LAD lesion is 30% stenosed.  Prox RCA lesion is 20% stenosed.  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  Right heart cath pressures show PCWP 18 mmHg with V wave of 24 mmHg from MR, LVEDP 14 mmHg.  PA diastolic 26 mm;  Cardiac Output-Index: 6.88-3.8 Glenetta Hew, MD  ECHOCARDIOGRAM COMPLETE  Result Date: 02/15/2020    ECHOCARDIOGRAM REPORT   Patient Name:   Valerie [name] [facility] Date of Exam: 02/15/2020 Medical Rec #:  [id number]           Height:       67.0 in Accession #:    [id number]          Weight:       153.4 lb Date of Birth:  1946/03/15          BSA:          1.807 m Patient Age:    57 years            BP:           109/61 mmHg Patient Gender: F                   HR:           82 bpm. Exam Location:  Inpatient Procedure: 2D Echo, Cardiac Doppler and Color Doppler Indications:    CHF  History:        Patient has no prior history of Echocardiogram examinations.                 CHF, Signs/Symptoms:Dyspnea, dementia and Murmur; Risk                 Factors:Hypertension. Lung cancer.  Sonographer:    Dustin Flock Referring Phys: [id number] [place]  1. Left ventricular ejection fraction, by estimation, is 20 to 25%. The left ventricle has severely decreased function. The left ventricle demonstrates global hypokinesis. Left ventricular diastolic parameters are consistent with Grade I diastolic dysfunction (impaired relaxation).  2. Right ventricular systolic function is normal. The  right ventricular size is normal. There is mildly elevated pulmonary artery systolic pressure.  3. The mitral valve is abnormal. Mild to moderate mitral valve regurgitation.  4. The aortic valve is grossly normal. Aortic valve regurgitation is mild.  5. The inferior vena cava is dilated in size with >50%  respiratory variability, suggesting right atrial pressure of 8 mmHg. FINDINGS  Left Ventricle: Left ventricular ejection fraction, by estimation, is 20 to 25%. The left ventricle has severely decreased function. The left ventricle demonstrates global hypokinesis. The left ventricular internal cavity size was normal in size. There is no left ventricular hypertrophy. Left ventricular diastolic parameters are consistent with Grade I diastolic dysfunction (impaired relaxation). Right Ventricle: The right ventricular size is normal. No increase in right ventricular wall thickness. Right ventricular systolic function is normal. There is mildly elevated pulmonary artery systolic pressure. The tricuspid regurgitant velocity is 2.95  m/s, and with an assumed right atrial pressure of 3 mmHg, the estimated right ventricular systolic pressure is 84.1 mmHg. Left Atrium: Left atrial size was normal in size. Right Atrium: Right atrial size was normal in size. Pericardium: Trivial pericardial effusion is present. Mitral Valve: The mitral valve is abnormal. There is mild thickening of the mitral valve leaflet(s). Mild to moderate mitral valve regurgitation. Tricuspid Valve: The tricuspid valve is normal in structure. Tricuspid valve regurgitation is mild. Aortic Valve: The aortic valve is grossly normal. Aortic valve regurgitation is mild. Aortic regurgitation PHT measures 578 msec. Pulmonic Valve: The pulmonic valve was not well visualized. Pulmonic valve regurgitation is not visualized. Aorta: The aortic root is normal in size and structure. Venous: The inferior vena cava is dilated in size with greater than 50% respiratory variability, suggesting right atrial pressure of 8 mmHg. IAS/Shunts: No atrial level shunt detected by color flow Doppler.  LEFT VENTRICLE PLAX 2D LVIDd:         5.50 cm  Diastology LVIDs:         4.70 cm  LV e' lateral:   4.35 cm/s LV PW:         0.90 cm  LV E/e' lateral: 12.4 LV IVS:        0.80 cm  LV e'  medial:    3.81 cm/s LVOT diam:     1.80 cm  LV E/e' medial:  14.1 LV SV:         43 LV SV Index:   24 LVOT Area:     2.54 cm  RIGHT VENTRICLE RV Basal diam:  2.70 cm RV S prime:     11.60 cm/s TAPSE (M-mode): 2.5 cm LEFT ATRIUM           Index       RIGHT ATRIUM           Index LA diam:      3.20 cm 1.77 cm/m  RA Area:     16.70 cm LA Vol (A2C): 45.4 ml 25.13 ml/m RA Volume:   46.90 ml  25.96 ml/m LA Vol (A4C): 67.6 ml 37.41 ml/m  AORTIC VALVE LVOT Vmax:   120.00 cm/s LVOT Vmean:  77.200 cm/s LVOT VTI:    0.170 m AI PHT:      578 msec  AORTA Ao Root diam: 3.20 cm MITRAL VALVE                TRICUSPID VALVE MV Area (PHT): 4.60 cm     TR Peak grad:   34.8 mmHg MV Decel Time: 165 msec  TR Vmax:        295.00 cm/s MV E velocity: 53.90 cm/s MV A velocity: 100.00 cm/s  SHUNTS MV E/A ratio:  0.54         Systemic VTI:  0.17 m                             Systemic Diam: 1.80 cm Dorris Carnes MD Electronically signed by Dorris Carnes MD Signature Date/Time: 02/15/2020/1:19:27 PM    Final     ASSESSMENT AND PLAN: This is a very pleasant 74 years old African-American female with metastatic non-small cell lung cancer, adenocarcinoma with positive EGFR mutation status post several years of treatment with Tarceva and the patient developed positive resistant mutation, T790M. She has been on treatment with Tagrisso for the last 5 years, status post 62 months of treatment.  She has been tolerating her treatment with Tagrisso well but she was recently admitted to [facility] with shortness of breath and she was diagnosed with congestive heart failure secondary to cardiomyopathy with ejection fraction of 20-25%.  This is likely secondary to her treatment with Tagrisso. I discontinued her treatment with Tagrisso on 02/13/2020. I discussed with the patient several options for management of her condition including switching to first generation EGFR tyrosine kinase inhibitor with Tarceva 150 mg p.o. daily with close  monitoring.  The patient was also given the option of systemic chemotherapy versus palliative care. After discussion with the options, the patient and her husband preferred to return back to treatment with Tarceva.  I will send the prescription to [facility] and the patient is expected to start this treatment in the next few days. I will see her back for follow-up visit in 1 months for evaluation and repeat blood work for close monitoring of her treatment with Tarceva. She will have repeat CT scan of the chest, abdomen and pelvis in 2 months for restaging of her disease after switching her treatment to Tarceva. For the anemia of chronic disease, she will continue her current treatment with oral iron tablet. The patient was advised to call immediately if she has any concerning symptoms in the interval. The patient voices understanding of current disease status and treatment options and is in agreement with the current care plan. All questions were answered. The patient knows to call the clinic with any problems, questions or concerns. We can certainly see the patient much sooner if necessary.  Disclaimer: This note was dictated with voice recognition software. Similar sounding words can inadvertently be transcribed and may not be corrected upon review.

## 2020-02-23 NOTE — Telephone Encounter (Signed)
Oral Oncology Patient Advocate Encounter  Prior Authorization for Tarceva has been approved.    PA# [id number] Effective dates: 02/23/20 through 08/21/20  Patients co-pay is $100  Oral Oncology Clinic will continue to follow.   [place] Patient [place] Phone [phone] Fax [phone] 02/23/2020 11:37 AM

## 2020-02-23 NOTE — Telephone Encounter (Signed)
Oral Oncology Patient Advocate Encounter  Received notification from [facility] that prior authorization for Tarceva is required.  PA submitted on CoverMyMeds Key [id number] Status is pending  Oral Oncology Clinic will continue to follow.  [place] Patient [place] Phone [phone] Fax [phone] 02/23/2020 11:32 AM

## 2020-02-23 NOTE — Telephone Encounter (Signed)
Scheduled per los. Gave avs and calendar. Patient requested lab, Scan, and MD be same day in sept due to transportation

## 2020-02-23 NOTE — Telephone Encounter (Signed)
Oral Oncology Pharmacist Encounter  Received new prescription for Tarceva (erlotinib) for the treatment of metastatic NSCLC, EGFR positive, planned duration until disease progression or unacceptable drug toxicity. Patient is being transitioned from treatment with osimertinib due to cardiomyopathy.  CMP from 02/23/20 assessed, no relevant lab abnormalities. Prescription dose and frequency assessed.   Current medication list in Epic reviewed, no DDIs with erlotinib identified.  Prescription has been e-scribed to the [facility] for benefits analysis and approval.  Oral Oncology Clinic will continue to follow for insurance authorization, copayment issues, initial counseling and start date.  Darl Pikes, PharmD, BCPS, BCOP, CPP Hematology/Oncology Clinical Pharmacist Practitioner [facility] [phone]  02/23/2020 11:57 AM

## 2020-02-24 ENCOUNTER — Ambulatory Visit (INDEPENDENT_AMBULATORY_CARE_PROVIDER_SITE_OTHER): Payer: Medicare PPO | Admitting: Physician Assistant

## 2020-02-24 ENCOUNTER — Encounter: Payer: Self-pay | Admitting: Physician Assistant

## 2020-02-24 VITALS — BP 99/67 | HR 106 | Ht 67.0 in | Wt 163.0 lb

## 2020-02-24 DIAGNOSIS — I5042 Chronic combined systolic (congestive) and diastolic (congestive) heart failure: Secondary | ICD-10-CM

## 2020-02-24 DIAGNOSIS — I10 Essential (primary) hypertension: Secondary | ICD-10-CM | POA: Diagnosis not present

## 2020-02-24 DIAGNOSIS — I480 Paroxysmal atrial fibrillation: Secondary | ICD-10-CM | POA: Diagnosis not present

## 2020-02-24 NOTE — Patient Instructions (Signed)
Medication Instructions:  Start Lasix 20mg  (1 Tablet Daily as Needed for swelling). Restart Toprol XL 25mg  1/2 (Half) tablet Daily if Blood Pressure greater than 100 *If you need a refill on your cardiac medications before your next appointment, please call your pharmacy*   Lab Work: None If you have labs (blood work) drawn today and your tests are completely normal, you will receive your results only by: [name] MyChart Message (if you have MyChart) OR . A paper copy in the mail If you have any lab test that is abnormal or we need to change your treatment, we will call you to review the results.   Testing/Procedures: None   Follow-Up: At [facility], you and your health needs are our priority.  As part of our continuing mission to provide you with exceptional heart care, we have created designated Provider Care Teams.  These Care Teams include your primary Cardiologist (physician) and Advanced Practice Providers (APPs -  Physician Assistants and Nurse Practitioners) who all work together to provide you with the care you need, when you need it.  We recommend signing up for the patient portal called "MyChart".  Sign up information is provided on this After Visit Summary.  MyChart is used to connect with patients for Virtual Visits (Telemedicine).  Patients are able to view lab/test results, encounter notes, upcoming appointments, etc.  Non-urgent messages can be sent to your provider as well.   To learn more about what you can do with MyChart, go to [email or web address].    Your next appointment:   3 month(s)  The format for your next appointment:   Virtual Visit   Provider:   Buford Dresser, MD   Other Instructions

## 2020-02-25 ENCOUNTER — Ambulatory Visit: Payer: Medicare PPO | Admitting: Physician Assistant

## 2020-02-25 ENCOUNTER — Other Ambulatory Visit: Payer: Medicare PPO

## 2020-02-25 MED FILL — ERLOTINIB HCL 150 MG TABS: 150 | 30 days supply | Qty: 30 | Fill #0

## 2020-02-25 NOTE — Telephone Encounter (Signed)
Error

## 2020-02-25 NOTE — Telephone Encounter (Signed)
Oral Chemotherapy Pharmacist Encounter  Patient's husband plans on picking up her medication from [place] tomorrow 02/26/20.  Patient Education I spoke with patient and her husband for overview of new oral chemotherapy medication: Tarceva (erlotinib) for the treatment of metastatic NSCLC, EGFR positive, planned duration until disease progression or unacceptable drug toxicity. Patient is being transitioned from treatment with osimertinib due to cardiomyopathy.   Pt is doing well. Counseled patient on administration, dosing, side effects, monitoring, drug-food interactions, safe handling, storage, and disposal. Patient will take 1 tablet (150 mg total) by mouth daily. Take on an empty stomach 1 hour before meals or 2 hours after.  Side effects include but not limited to: acne-;ike rash, rash/itchy skin, diarrhea, fatigue, N/V.    Reviewed with patient importance of keeping a medication schedule and plan for any missed doses.  They voiced understanding and appreciation. All questions answered. Medication handout placed in the mail.  Provided patient with Oral [place] [facility] phone number. Patient knows to call the office with questions or concerns. Oral Chemotherapy Navigation Clinic will continue to follow.  Darl Pikes, PharmD, BCPS, BCOP, CPP Hematology/Oncology Clinical Pharmacist Practitioner [facility] [phone]  02/25/2020 4:56 PM

## 2020-02-26 ENCOUNTER — Ambulatory Visit: Payer: Self-pay

## 2020-02-26 DIAGNOSIS — R269 Unspecified abnormalities of gait and mobility: Secondary | ICD-10-CM

## 2020-02-26 DIAGNOSIS — Z79899 Other long term (current) drug therapy: Secondary | ICD-10-CM | POA: Diagnosis not present

## 2020-02-26 DIAGNOSIS — S82141D Displaced bicondylar fracture of right tibia, subsequent encounter for closed fracture with routine healing: Secondary | ICD-10-CM | POA: Diagnosis not present

## 2020-02-26 DIAGNOSIS — C3411 Malignant neoplasm of upper lobe, right bronchus or lung: Secondary | ICD-10-CM

## 2020-02-26 DIAGNOSIS — F0390 Unspecified dementia without behavioral disturbance: Secondary | ICD-10-CM

## 2020-02-26 NOTE — Chronic Care Management (AMB) (Signed)
  Chronic Care Management   Outreach Note  02/26/2020 Name: Valerie [name] MRN: [id number] DOB: Apr 20, 1946  Referred by: [name], MD Reason for referral : Care Coordination   An unsuccessful telephone outreach was attempted today. The patient was referred to the case management team for assistance with care management and care coordination.  Unfortunately, SW was unable to leave a voice message due to voice mailbox not being established.   Follow Up Plan: The care management team will reach out to the patient again over the next 10 days.   [name], BSW, CDP Social Worker, Certified Dementia Practitioner [place] [facility] [place] [facility] [phone]

## 2020-03-01 ENCOUNTER — Telehealth: Payer: Medicare PPO

## 2020-03-01 ENCOUNTER — Other Ambulatory Visit: Payer: Self-pay

## 2020-03-01 ENCOUNTER — Ambulatory Visit: Payer: Self-pay

## 2020-03-01 ENCOUNTER — Ambulatory Visit (INDEPENDENT_AMBULATORY_CARE_PROVIDER_SITE_OTHER): Payer: Medicare PPO

## 2020-03-01 DIAGNOSIS — C3411 Malignant neoplasm of upper lobe, right bronchus or lung: Secondary | ICD-10-CM

## 2020-03-01 DIAGNOSIS — F0390 Unspecified dementia without behavioral disturbance: Secondary | ICD-10-CM

## 2020-03-01 NOTE — Chronic Care Management (AMB) (Signed)
Chronic Care Management   Initial Visit Note  03/01/2020 Name: Valerie [name] MRN: [id number] DOB: 06-01-1946  Referred by: Valerie Chard, MD Reason for referral : Chronic Care Management (RQ RN CM Case Collaboration)   Valerie [name] is a 74 y.o. year old female who is a primary care patient of Valerie Chard, MD. The care management team was consulted for assistance with chronic disease management and care coordination needs related to Malignant Neoplasm of upper lobe of right lung; Dementia w/o behavior disturbances, unspecified type.  Review of patient status, including review of consultants reports, relevant laboratory and other test results, and collaboration with appropriate care team members and the patient's provider was performed as part of comprehensive patient evaluation and provision of chronic care management services.    I initiated and established the plan of care for Valerie [name] during one on one collaboration with my clinical care management colleague Valerie [name] BSW who is also engaged with this patient to address social work needs.   Outpatient Encounter Medications as of 03/01/2020  Medication Sig Note  . ammonium lactate (LAC-HYDRIN) 12 % lotion Apply topically 2 (two) times daily.   . Ascorbic Acid (VITAMIN C) 100 MG tablet Take 100 mg by mouth every evening. Gummie   . atorvastatin (LIPITOR) 80 MG tablet Take 1 tablet (80 mg total) by mouth at bedtime.   . clopidogrel (PLAVIX) 75 MG tablet Take 1 tablet (75 mg total) by mouth daily with breakfast.   . erlotinib (TARCEVA) 150 MG tablet Take 1 tablet (150 mg total) by mouth daily. Take on an empty stomach 1 hour before meals or 2 hours after. (Patient not taking: Reported on 02/24/2020)   . furosemide (LASIX) 20 MG tablet Take 20 mg by mouth as needed.   [name] MAGNESIUM PO Take 1 tablet by mouth daily.    . metoprolol succinate (TOPROL-XL) 25 MG 24 hr tablet Take 1 tablet (25 mg total) by mouth daily.  (Patient not taking: Reported on 02/24/2020) 02/23/2020: On hold  . Multiple Vitamin (MULTI-VITAMINS) TABS Take 1 tablet by mouth every evening.    . polyethylene glycol (MIRALAX / GLYCOLAX) 17 g packet Take 17 g by mouth daily.   . potassium chloride SA (KLOR-CON) 20 MEQ tablet Take 2 tablets (40 mEq total) by mouth daily.   . vitamin B-12 (CYANOCOBALAMIN) 1000 MCG tablet Take 1,000 mcg by mouth every evening.   [name] 20 MG TABS tablet Take 1 tablet (20 mg total) by mouth every evening. 02/15/2020: PT reported LD of 6/27 ~11AM   No facility-administered encounter medications on file as of 03/01/2020.     Goals Addressed    . Assist with Chronic Care Management and Care Coordination needs       CARE PLAN ENTRY (see longtitudinal plan of care for additional care plan information)   Current Barriers:  . Chronic Disease Management support, education, and care coordination needs related to Malignant Neoplasm of upper lobe of right lung; Dementia w/o behavior disturbance, unspecified type  Case Manager Clinical Goal(s):  [name] Over the next 30 days, patient will work with BSW to address needs related to Transportation in patient with Malignant Neoplasm of upper lobe of right lung, Dementia w/o behavior disturbance, unspecified type   Interventions:  . Collaborated with BSW to initiate plan of care to address needs related to Transportation in patient with Malignant Neoplasm of upper lobe of right lobe; Dementia w/o behavior disturbance, unspecified type  Patient Self Care Activities:  .  Patient verbalizes understanding of plan to work with embedded BSW for assistance with transportation  . Supportive husband to assist with care needs   Initial goal documentation        Telephone follow up appointment with care management team member scheduled for: 04/19/20  Barb Merino, RN, BSN, CCM Care Management Coordinator [place] [facility]  Direct Phone:  [phone]

## 2020-03-01 NOTE — Patient Instructions (Signed)
Social Worker Visit Information  Goals we discussed today:  Goals Addressed              This Visit's Progress     Patient Stated   .  "I need help with transportation" (pt-stated)        Valerie [name] (see longitudinal plan of care for additional care plan information)  Current Barriers:  . Financial constraints related to cost of wheelchair transportation . Lacks knowledge of community resource: SCAT . Chronic conditions including  Malignant Neoplasm of upper lobe of right lung and Dementia w/o behavior disturbance which impact patient ability to perform ADL's and iADL's  Social Work Clinical Goal(s):  [name] Over the next 45 days the patient will work with SW to become more knowledgeable of SCAT resource to assist with transportation needs  CCM SW Interventions: Completed 03/01/20 . Inter-disciplinary care team collaboration (see longitudinal plan of care) . Successful outbound call placed to the patient to conduct an SDOH (social determinants of health) screen . Determined the patient has difficulty affording wheelchair accessible transportation . Provided education on transportation resource: SCAT . Initiated SCAT application on behalf of the patient . Advised the patient, SW would send to her provider for completion of Part B prior to submission . Scheduled follow up call over the next two weeks to follow up on goal progression  Patient Self Care Activities:  . Patient verbalizes understanding of plan to work with SW to obtain SCAT approval . Self administers medications as prescribed . Calls pharmacy for medication refills . Calls provider office for new concerns or questions . Does not attend all scheduled provider appointments due to transportation barriers  Initial goal documentation         Materials provided: Verbal education about care management program provided by phone  Valerie [name] was given information about Chronic Care Management services today  including:  1. CCM service includes personalized support from designated clinical staff supervised by her physician, including individualized plan of care and coordination with other care providers 2. 24/7 contact phone numbers for assistance for urgent and routine care needs. 3. Service will only be billed when office clinical staff spend 20 minutes or more in a month to coordinate care. 4. Only one practitioner may furnish and bill the service in a calendar month. 5. The patient may stop CCM services at any time (effective at the end of the month) by phone call to the office staff. 6. The patient will be responsible for cost sharing (co-pay) of up to 20% of the service fee (after annual deductible is met).  Patient agreed to services and verbal consent obtained.   The patient verbalized understanding of instructions provided today and declined a print copy of patient instruction materials.   Follow up plan: SW will follow up with patient by phone over the next two weeks,   [name], BSW, CDP Social Worker, Certified Dementia Practitioner [place] [facility] [place] [facility] [phone]

## 2020-03-01 NOTE — Chronic Care Management (AMB) (Signed)
Chronic Care Management    Social Work General Note  03/01/2020 Name: Valerie [name] MRN: [id number] DOB: [birth year]  Valerie [name] is a 74 y.o. year old female who is a primary care patient of [name], MD. The CCM was consulted to assist the patient with care coordination.   Valerie [name] was given information about Chronic Care Management services today including:  1. CCM service includes personalized support from designated clinical staff supervised by her physician, including individualized plan of care and coordination with other care providers 2. 24/7 contact phone numbers for assistance for urgent and routine care needs. 3. Service will only be billed when office clinical staff spend 20 minutes or more in a month to coordinate care. 4. Only one practitioner may furnish and bill the service in a calendar month. 5. The patient may stop CCM services at any time (effective at the end of the month) by phone call to the office staff. 6. The patient will be responsible for cost sharing (co-pay) of up to 20% of the service fee (after annual deductible is met).  Patient agreed to services and verbal consent obtained.   Review of patient status, including review of consultants reports, relevant laboratory and other test results, and collaboration with appropriate care team members and the patient's provider was performed as part of comprehensive patient evaluation and provision of chronic care management services.    SDOH (Social Determinants of Health) assessments and interventions performed:  Yes SDOH Interventions     Most Recent Value  SDOH Interventions  Food Insecurity Interventions Intervention Not Indicated  Housing Interventions Intervention Not Indicated  Transportation Interventions SCAT ([employer]ized Community [employer])       Outpatient Encounter Medications as of 03/01/2020  Medication Sig Note  . ammonium lactate (LAC-HYDRIN) 12 % lotion Apply  topically 2 (two) times daily.   . Ascorbic Acid (VITAMIN C) 100 MG tablet Take 100 mg by mouth every evening. Gummie   . atorvastatin (LIPITOR) 80 MG tablet Take 1 tablet (80 mg total) by mouth at bedtime.   . clopidogrel (PLAVIX) 75 MG tablet Take 1 tablet (75 mg total) by mouth daily with breakfast.   . erlotinib (TARCEVA) 150 MG tablet Take 1 tablet (150 mg total) by mouth daily. Take on an empty stomach 1 hour before meals or 2 hours after. (Patient not taking: Reported on 02/24/2020)   . furosemide (LASIX) 20 MG tablet Take 20 mg by mouth as needed.   [name] MAGNESIUM PO Take 1 tablet by mouth daily.    . metoprolol succinate (TOPROL-XL) 25 MG 24 hr tablet Take 1 tablet (25 mg total) by mouth daily. (Patient not taking: Reported on 02/24/2020) 02/23/2020: On hold  . Multiple Vitamin (MULTI-VITAMINS) TABS Take 1 tablet by mouth every evening.    . polyethylene glycol (MIRALAX / GLYCOLAX) 17 g packet Take 17 g by mouth daily.   . potassium chloride SA (KLOR-CON) 20 MEQ tablet Take 2 tablets (40 mEq total) by mouth daily.   . vitamin B-12 (CYANOCOBALAMIN) 1000 MCG tablet Take 1,000 mcg by mouth every evening.   Valerie [name] 20 MG TABS tablet Take 1 tablet (20 mg total) by mouth every evening. 02/15/2020: PT reported LD of 6/27 ~11AM   No facility-administered encounter medications on file as of 03/01/2020.    Goals Addressed              This Visit's Progress     Patient Stated   .  "I need help  with transportation" (pt-stated)        [place] (see longitudinal plan of care for additional care plan information)  Current Barriers:  . Financial constraints related to cost of wheelchair transportation . Lacks knowledge of community resource: SCAT . Chronic conditions including  Malignant Neoplasm of upper lobe of right lung and Dementia w/o behavior disturbance which impact patient ability to perform ADL's and iADL's  Social Work Clinical Goal(s):  [name] Over the next 45 days the patient will  work with SW to become more knowledgeable of SCAT resource to assist with transportation needs  CCM SW Interventions: Completed 03/01/20 . Inter-disciplinary care team collaboration (see longitudinal plan of care) . Successful outbound call placed to the patient to conduct an SDOH (social determinants of health) screen . Determined the patient has difficulty affording wheelchair accessible transportation . Provided education on transportation resource: SCAT . Initiated SCAT application on behalf of the patient . Advised the patient, SW would send to her provider for completion of Part B prior to submission . Scheduled follow up call over the next two weeks to follow up on goal progression  Patient Self Care Activities:  . Patient verbalizes understanding of plan to work with SW to obtain SCAT approval . Self administers medications as prescribed . Calls pharmacy for medication refills . Calls provider office for new concerns or questions . Does not attend all scheduled provider appointments due to transportation barriers  Initial goal documentation         Follow Up Plan: SW will follow up with patient by phone over the next two weeks.       [name], BSW, CDP Social Worker, Certified Dementia Practitioner [place] [facility] [place] [facility] [phone]  Total time spent performing care coordination and/or care management activities with the patient by phone or face to face = 25 minutes.

## 2020-03-02 ENCOUNTER — Telehealth: Payer: Self-pay

## 2020-03-02 NOTE — Telephone Encounter (Signed)
The pt called and said that [facility] health care said that they don't see patients that can't walk at all.  The pt said that she needed an FL2 filled out so that she can go to [facility].

## 2020-03-03 ENCOUNTER — Telehealth: Payer: Self-pay

## 2020-03-03 ENCOUNTER — Other Ambulatory Visit: Payer: Self-pay | Admitting: Internal Medicine

## 2020-03-03 MED ORDER — SACCHAROMYCES BOULARDII 250 MG PO CAPS: 250.0000 mg | ORAL_CAPSULE | Freq: Two times a day (BID) | ORAL | 2 refills | Status: DC

## 2020-03-03 NOTE — Telephone Encounter (Signed)
The pt's husband Dr. Amadeo Garnet was asked if the pt uses any mobility aids and he said yes that the pt uses a wheelchair because she is unable to stand.

## 2020-03-03 NOTE — Progress Notes (Signed)
[place]

## 2020-03-07 DIAGNOSIS — S72451D Displaced supracondylar fracture without intracondylar extension of lower end of right femur, subsequent encounter for closed fracture with routine healing: Secondary | ICD-10-CM | POA: Diagnosis not present

## 2020-03-14 ENCOUNTER — Telehealth: Payer: Self-pay

## 2020-03-14 NOTE — Telephone Encounter (Signed)
Valerie [name] and her husband was notified that I spoke the [facility] remote and that [name] said that they maybe able to send someone out tomorrow to evaluate the pt for her ears and that they are going to call the pt to schedule a time.

## 2020-03-15 ENCOUNTER — Telehealth: Payer: Self-pay

## 2020-03-15 ENCOUNTER — Ambulatory Visit: Payer: Medicare PPO

## 2020-03-15 DIAGNOSIS — L308 Other specified dermatitis: Secondary | ICD-10-CM | POA: Diagnosis not present

## 2020-03-15 DIAGNOSIS — C3411 Malignant neoplasm of upper lobe, right bronchus or lung: Secondary | ICD-10-CM | POA: Diagnosis not present

## 2020-03-15 DIAGNOSIS — F0390 Unspecified dementia without behavioral disturbance: Secondary | ICD-10-CM | POA: Diagnosis not present

## 2020-03-15 DIAGNOSIS — D485 Neoplasm of uncertain behavior of skin: Secondary | ICD-10-CM | POA: Diagnosis not present

## 2020-03-15 DIAGNOSIS — L820 Inflamed seborrheic keratosis: Secondary | ICD-10-CM | POA: Diagnosis not present

## 2020-03-15 NOTE — Telephone Encounter (Signed)
  Chronic Care Management   Outreach Note  03/15/2020 Name: Valerie [name] MRN: [id number] DOB: 21-Jul-1946  Referred by: [name], MD Reason for referral : Care Coordination   An unsuccessful telephone outreach was attempted today in an attempt to follow up on patient goal progression of SW transportation goal. SW unable to leave a voice message as no voice mailbox was established.  Follow Up Plan: The care management team will reach out to the patient again over the next 14 days.   [name], BSW, CDP Social Worker, Certified Dementia Practitioner [place] [facility] [place] [facility] [phone]

## 2020-03-15 NOTE — Patient Instructions (Signed)
Social Worker Visit Information  Goals we discussed today:  Goals Addressed              This Visit's Progress     Patient Stated   .  "I need help with transportation" (pt-stated)   On track     [place] (see longitudinal plan of care for additional care plan information)  Current Barriers:  . Financial constraints related to cost of wheelchair transportation . Lacks knowledge of community resource: SCAT . Chronic conditions including  Malignant Neoplasm of upper lobe of right lung and Dementia w/o behavior disturbance which impact patient ability to perform ADL's and iADL's  Social Work Clinical Goal(s):  [name] Over the next 45 days the patient will work with SW to become more knowledgeable of SCAT resource to assist with transportation needs  CCM SW Interventions: Completed 03/15/20 . Collaboration with Almedia Balls with [place] (formerly SC[employer]) to confirm transportation application received . Successful outbound call placed to the patient to update on goal progression . Advised the patient she will receive approval notice via mail indicating whether or not the patient is approved and length of approval . Scheduled follow up over the next two weeks to assess goal progression  Completed 03/01/20 . Inter-disciplinary care team collaboration (see longitudinal plan of care) . Successful outbound call placed to the patient to conduct an SDOH (social determinants of health) screen . Determined the patient has difficulty affording wheelchair accessible transportation . Provided education on transportation resource: SCAT . Initiated SCAT application on behalf of the patient . Advised the patient, SW would send to her provider for completion of Part B prior to submission . Scheduled follow up call over the next two weeks to follow up on goal progression  Patient Self Care Activities:  . Patient verbalizes understanding of plan to work with SW to obtain SCAT  approval . Self administers medications as prescribed . Calls pharmacy for medication refills . Calls provider office for new concerns or questions . Does not attend all scheduled provider appointments due to transportation barriers  Please see past updates related to this goal by clicking on the "Past Updates" button in the selected goal      .  "I want to be placed so I can walk again" (pt-stated)        [place] (see longitudinal plan of care for additional care plan information)  Current Barriers:  . Inability to perform ADL's independently . Inability to perform IADL's independently . Wheelchair bound, has not walked in approximately 1 year . Chronic conditions including Dementia and Malignant Neoplasm of upper lobe of right lung  Social Work Clinical Goal(s):  [name] Over the next 90 days the patient will work with SW and primary care provider to determine if the patient is eligible for inpatient rehab to meet the patients goal to walk again  CCM SW Interventions: Completed 03/15/20 with the patient . Inter-disciplinary care team collaboration (see longitudinal plan of care) . Successful outbound call placed to the patient to assess for care coordination needs . Determined the patient is wanting placement into [facility] in hopes of meeting goal to "walk again" . Assessed for patient interest in long-term placement versus acute placement o The patient stated "I want to be there until I can walk again" . Advised the patient SW would collaborate with her primary care provider regarding patient goal for placement . Collaboration with Dr. Baird Cancer to determine patient placement needs . Scheduled  follow up call to the patient over the next two weeks  Patient Self Care Activities:  . Patient verbalizes understanding of plan to work with SW and primary care provider to determine placement needs . Calls pharmacy for medication refills . Supportive husband to assist with  patient care needs . Unable to perform ADLs independently . Unable to perform IADLs independently  Initial goal documentation         Materials Provided: Verbal education about Access transportation provided by phone  Follow Up Plan: SW will follow up with patient by phone over the next two weeks.  [name], BSW, CDP Social Worker, Certified Dementia Practitioner [place] [facility] [place] [facility] [phone]

## 2020-03-15 NOTE — Chronic Care Management (AMB) (Signed)
Chronic Care Management    Social Work Follow Up Note  03/15/2020 Name: Valerie [name] MRN: [id number] DOB: 07/14/1946  Valerie [name] is a 74 y.o. year old female who is a primary care patient of [name], MD. The CCM team was consulted for assistance with care coordination.   Review of patient status, including review of consultants reports, other relevant assessments, and collaboration with appropriate care team members and the patient's provider was performed as part of comprehensive patient evaluation and provision of chronic care management services.    SDOH (Social Determinants of Health) assessments performed: No    Outpatient Encounter Medications as of 03/15/2020  Medication Sig Note  . ammonium lactate (LAC-HYDRIN) 12 % lotion Apply topically 2 (two) times daily.   . Ascorbic Acid (VITAMIN C) 100 MG tablet Take 100 mg by mouth every evening. Gummie   . atorvastatin (LIPITOR) 80 MG tablet Take 1 tablet (80 mg total) by mouth at bedtime.   . clopidogrel (PLAVIX) 75 MG tablet Take 1 tablet (75 mg total) by mouth daily with breakfast.   . erlotinib (TARCEVA) 150 MG tablet Take 1 tablet (150 mg total) by mouth daily. Take on an empty stomach 1 hour before meals or 2 hours after. (Patient not taking: Reported on 02/24/2020)   . furosemide (LASIX) 20 MG tablet Take 20 mg by mouth as needed.   [name] MAGNESIUM PO Take 1 tablet by mouth daily.    . metoprolol succinate (TOPROL-XL) 25 MG 24 hr tablet Take 1 tablet (25 mg total) by mouth daily. (Patient not taking: Reported on 02/24/2020) 02/23/2020: On hold  . Multiple Vitamin (MULTI-VITAMINS) TABS Take 1 tablet by mouth every evening.    . polyethylene glycol (MIRALAX / GLYCOLAX) 17 g packet Take 17 g by mouth daily.   . potassium chloride SA (KLOR-CON) 20 MEQ tablet Take 2 tablets (40 mEq total) by mouth daily.   [name] saccharomyces boulardii (FLORASTOR) 250 MG capsule Take 1 capsule (250 mg total) by mouth 2 (two) times daily.   .  vitamin B-12 (CYANOCOBALAMIN) 1000 MCG tablet Take 1,000 mcg by mouth every evening.   [name] 20 MG TABS tablet Take 1 tablet (20 mg total) by mouth every evening. 02/15/2020: PT reported LD of 6/27 ~11AM   No facility-administered encounter medications on file as of 03/15/2020.     Goals Addressed              This Visit's Progress     Patient Stated   .  "I need help with transportation" (pt-stated)   On track     [place] (see longitudinal plan of care for additional care plan information)  Current Barriers:  . Financial constraints related to cost of wheelchair transportation . Lacks knowledge of community resource: [facility] . Chronic conditions including  Malignant Neoplasm of upper lobe of right lung and Dementia w/o behavior disturbance which impact patient ability to perform ADL's and iADL's  Social Work Clinical Goal(s):  [name] Over the next 45 days the patient will work with SW to become more knowledgeable of [facility] resource to assist with transportation needs  CCM SW Interventions: Completed 03/15/20 . Collaboration with Almedia Balls with [place] (formerly [facility]) to confirm transportation application received . Successful outbound call placed to the patient to update on goal progression . Advised the patient she will receive approval notice via mail indicating whether or not the patient is approved and length of approval . Scheduled follow up over  the next two weeks to assess goal progression  Completed 03/01/20 . Inter-disciplinary care team collaboration (see longitudinal plan of care) . Successful outbound call placed to the patient to conduct an SDOH (social determinants of health) screen . Determined the patient has difficulty affording wheelchair accessible transportation . Provided education on transportation resource: [facility] . Initiated [facility] application on behalf of the patient . Advised the patient, SW would send to her provider for completion of Part B  prior to submission . Scheduled follow up call over the next two weeks to follow up on goal progression  Patient Self Care Activities:  . Patient verbalizes understanding of plan to work with SW to obtain [facility] approval . Self administers medications as prescribed . Calls pharmacy for medication refills . Calls provider office for new concerns or questions . Does not attend all scheduled provider appointments due to transportation barriers  Please see past updates related to this goal by clicking on the "Past Updates" button in the selected goal      .  "I want to be placed so I can walk again" (pt-stated)        [place] (see longitudinal plan of care for additional care plan information)  Current Barriers:  . Inability to perform ADL's independently . Inability to perform IADL's independently . Wheelchair bound, has not walked in approximately 1 year . Chronic conditions including Dementia and Malignant Neoplasm of upper lobe of right lung  Social Work Clinical Goal(s):  [name] Over the next 90 days the patient will work with SW and primary care provider to determine if the patient is eligible for inpatient rehab to meet the patients goal to walk again  CCM SW Interventions: Completed 03/15/20 with the patient . Inter-disciplinary care team collaboration (see longitudinal plan of care) . Successful outbound call placed to the patient to assess for care coordination needs . Determined the patient is wanting placement into [facility] in hopes of meeting goal to "walk again" . Assessed for patient interest in long-term placement versus acute placement o The patient stated "I want to be there until I can walk again" . Advised the patient SW would collaborate with her primary care provider regarding patient goal for placement . Collaboration with Dr. Baird Cancer to determine patient placement needs . Scheduled follow up call to the patient over the next two weeks  Patient Self  Care Activities:  . Patient verbalizes understanding of plan to work with SW and primary care provider to determine placement needs . Calls pharmacy for medication refills . Supportive husband to assist with patient care needs . Unable to perform ADLs independently . Unable to perform IADLs independently  Initial goal documentation         Follow Up Plan: SW will follow up with patient by phone over the next two weeks.  [name], BSW, CDP Social Worker, Certified Dementia Practitioner [place] [facility] [facility] [phone]  Total time spent performing care coordination and/or care management activities with the patient by phone or face to face = 15 minutes.

## 2020-03-16 DIAGNOSIS — S72401A Unspecified fracture of lower end of right femur, initial encounter for closed fracture: Secondary | ICD-10-CM | POA: Diagnosis not present

## 2020-03-22 ENCOUNTER — Emergency Department (HOSPITAL_COMMUNITY): Payer: Medicare PPO

## 2020-03-22 ENCOUNTER — Ambulatory Visit: Payer: Medicare PPO

## 2020-03-22 ENCOUNTER — Encounter (HOSPITAL_COMMUNITY): Payer: Self-pay | Admitting: Emergency Medicine

## 2020-03-22 ENCOUNTER — Inpatient Hospital Stay (HOSPITAL_COMMUNITY)
Admission: EM | Admit: 2020-03-22 | Discharge: 2020-03-29 | DRG: 689 | Disposition: A | Discharge status: Home / Self Care | Payer: Medicare PPO | Attending: Internal Medicine | Admitting: Internal Medicine

## 2020-03-22 ENCOUNTER — Other Ambulatory Visit: Payer: Self-pay

## 2020-03-22 DIAGNOSIS — Z85118 Personal history of other malignant neoplasm of bronchus and lung: Secondary | ICD-10-CM | POA: Diagnosis not present

## 2020-03-22 DIAGNOSIS — Z9221 Personal history of antineoplastic chemotherapy: Secondary | ICD-10-CM

## 2020-03-22 DIAGNOSIS — C719 Malignant neoplasm of brain, unspecified: Secondary | ICD-10-CM | POA: Diagnosis not present

## 2020-03-22 DIAGNOSIS — R5381 Other malaise: Secondary | ICD-10-CM | POA: Diagnosis not present

## 2020-03-22 DIAGNOSIS — N390 Urinary tract infection, site not specified: Secondary | ICD-10-CM

## 2020-03-22 DIAGNOSIS — R4781 Slurred speech: Secondary | ICD-10-CM | POA: Diagnosis not present

## 2020-03-22 DIAGNOSIS — R52 Pain, unspecified: Secondary | ICD-10-CM | POA: Diagnosis not present

## 2020-03-22 DIAGNOSIS — I420 Dilated cardiomyopathy: Secondary | ICD-10-CM | POA: Diagnosis present

## 2020-03-22 DIAGNOSIS — C3411 Malignant neoplasm of upper lobe, right bronchus or lung: Secondary | ICD-10-CM

## 2020-03-22 DIAGNOSIS — I471 Supraventricular tachycardia, unspecified: Secondary | ICD-10-CM | POA: Clinically undetermined

## 2020-03-22 DIAGNOSIS — Z20822 Contact with and (suspected) exposure to covid-19: Secondary | ICD-10-CM | POA: Diagnosis present

## 2020-03-22 DIAGNOSIS — E876 Hypokalemia: Secondary | ICD-10-CM | POA: Diagnosis present

## 2020-03-22 DIAGNOSIS — I10 Essential (primary) hypertension: Secondary | ICD-10-CM | POA: Diagnosis not present

## 2020-03-22 DIAGNOSIS — Z66 Do not resuscitate: Secondary | ICD-10-CM | POA: Diagnosis present

## 2020-03-22 DIAGNOSIS — Z85850 Personal history of malignant neoplasm of thyroid: Secondary | ICD-10-CM

## 2020-03-22 DIAGNOSIS — Z86718 Personal history of other venous thrombosis and embolism: Secondary | ICD-10-CM

## 2020-03-22 DIAGNOSIS — Z902 Acquired absence of lung [part of]: Secondary | ICD-10-CM

## 2020-03-22 DIAGNOSIS — R4182 Altered mental status, unspecified: Secondary | ICD-10-CM

## 2020-03-22 DIAGNOSIS — Z809 Family history of malignant neoplasm, unspecified: Secondary | ICD-10-CM

## 2020-03-22 DIAGNOSIS — I110 Hypertensive heart disease with heart failure: Secondary | ICD-10-CM | POA: Diagnosis present

## 2020-03-22 DIAGNOSIS — Z8249 Family history of ischemic heart disease and other diseases of the circulatory system: Secondary | ICD-10-CM

## 2020-03-22 DIAGNOSIS — G9341 Metabolic encephalopathy: Secondary | ICD-10-CM | POA: Diagnosis present

## 2020-03-22 DIAGNOSIS — J9811 Atelectasis: Secondary | ICD-10-CM | POA: Diagnosis not present

## 2020-03-22 DIAGNOSIS — M810 Age-related osteoporosis without current pathological fracture: Secondary | ICD-10-CM | POA: Diagnosis present

## 2020-03-22 DIAGNOSIS — I5042 Chronic combined systolic (congestive) and diastolic (congestive) heart failure: Secondary | ICD-10-CM | POA: Diagnosis present

## 2020-03-22 DIAGNOSIS — R9431 Abnormal electrocardiogram [ECG] [EKG]: Secondary | ICD-10-CM | POA: Diagnosis not present

## 2020-03-22 DIAGNOSIS — Z8619 Personal history of other infectious and parasitic diseases: Secondary | ICD-10-CM | POA: Diagnosis not present

## 2020-03-22 DIAGNOSIS — J90 Pleural effusion, not elsewhere classified: Secondary | ICD-10-CM | POA: Diagnosis not present

## 2020-03-22 DIAGNOSIS — Z923 Personal history of irradiation: Secondary | ICD-10-CM

## 2020-03-22 DIAGNOSIS — D649 Anemia, unspecified: Secondary | ICD-10-CM

## 2020-03-22 DIAGNOSIS — Z7901 Long term (current) use of anticoagulants: Secondary | ICD-10-CM | POA: Diagnosis not present

## 2020-03-22 DIAGNOSIS — M2550 Pain in unspecified joint: Secondary | ICD-10-CM | POA: Diagnosis not present

## 2020-03-22 DIAGNOSIS — I4891 Unspecified atrial fibrillation: Secondary | ICD-10-CM | POA: Diagnosis not present

## 2020-03-22 DIAGNOSIS — I959 Hypotension, unspecified: Secondary | ICD-10-CM | POA: Diagnosis not present

## 2020-03-22 DIAGNOSIS — R7881 Bacteremia: Secondary | ICD-10-CM | POA: Diagnosis present

## 2020-03-22 DIAGNOSIS — I5041 Acute combined systolic (congestive) and diastolic (congestive) heart failure: Secondary | ICD-10-CM | POA: Diagnosis not present

## 2020-03-22 DIAGNOSIS — F0390 Unspecified dementia without behavioral disturbance: Secondary | ICD-10-CM | POA: Diagnosis present

## 2020-03-22 DIAGNOSIS — C7931 Secondary malignant neoplasm of brain: Secondary | ICD-10-CM | POA: Diagnosis present

## 2020-03-22 DIAGNOSIS — I447 Left bundle-branch block, unspecified: Secondary | ICD-10-CM | POA: Diagnosis not present

## 2020-03-22 DIAGNOSIS — I4581 Long QT syndrome: Secondary | ICD-10-CM | POA: Diagnosis not present

## 2020-03-22 DIAGNOSIS — Z79899 Other long term (current) drug therapy: Secondary | ICD-10-CM

## 2020-03-22 DIAGNOSIS — I248 Other forms of acute ischemic heart disease: Secondary | ICD-10-CM | POA: Diagnosis present

## 2020-03-22 DIAGNOSIS — R778 Other specified abnormalities of plasma proteins: Secondary | ICD-10-CM | POA: Diagnosis not present

## 2020-03-22 DIAGNOSIS — I351 Nonrheumatic aortic (valve) insufficiency: Secondary | ICD-10-CM | POA: Diagnosis not present

## 2020-03-22 DIAGNOSIS — I340 Nonrheumatic mitral (valve) insufficiency: Secondary | ICD-10-CM | POA: Diagnosis not present

## 2020-03-22 DIAGNOSIS — Z7401 Bed confinement status: Secondary | ICD-10-CM | POA: Diagnosis not present

## 2020-03-22 DIAGNOSIS — I38 Endocarditis, valve unspecified: Secondary | ICD-10-CM | POA: Diagnosis not present

## 2020-03-22 DIAGNOSIS — I509 Heart failure, unspecified: Secondary | ICD-10-CM

## 2020-03-22 DIAGNOSIS — Q211 Atrial septal defect: Secondary | ICD-10-CM | POA: Diagnosis not present

## 2020-03-22 DIAGNOSIS — I2510 Atherosclerotic heart disease of native coronary artery without angina pectoris: Secondary | ICD-10-CM | POA: Diagnosis not present

## 2020-03-22 DIAGNOSIS — B9562 Methicillin resistant Staphylococcus aureus infection as the cause of diseases classified elsewhere: Secondary | ICD-10-CM | POA: Diagnosis not present

## 2020-03-22 DIAGNOSIS — R7989 Other specified abnormal findings of blood chemistry: Secondary | ICD-10-CM | POA: Diagnosis present

## 2020-03-22 DIAGNOSIS — R404 Transient alteration of awareness: Secondary | ICD-10-CM | POA: Diagnosis not present

## 2020-03-22 LAB — CBC WITH DIFFERENTIAL/PLATELET
Abs Immature Granulocytes: 0.05 10*3/uL (ref 0.00–0.07)
Basophils Absolute: 0 10*3/uL (ref 0.0–0.1)
Basophils Relative: 0 %
Eosinophils Absolute: 0.1 10*3/uL (ref 0.0–0.5)
Eosinophils Relative: 1 %
HCT: 36.8 % (ref 36.0–46.0)
Hemoglobin: 11.4 g/dL — ABNORMAL LOW (ref 12.0–15.0)
Immature Granulocytes: 1 %
Lymphocytes Relative: 6 %
Lymphs Abs: 0.6 10*3/uL — ABNORMAL LOW (ref 0.7–4.0)
MCH: 29.4 pg (ref 26.0–34.0)
MCHC: 31 g/dL (ref 30.0–36.0)
MCV: 94.8 fL (ref 80.0–100.0)
Monocytes Absolute: 0.6 10*3/uL (ref 0.1–1.0)
Monocytes Relative: 6 %
Neutro Abs: 9.4 10*3/uL — ABNORMAL HIGH (ref 1.7–7.7)
Neutrophils Relative %: 86 %
Platelets: 275 10*3/uL (ref 150–400)
RBC: 3.88 MIL/uL (ref 3.87–5.11)
RDW: 15.8 % — ABNORMAL HIGH (ref 11.5–15.5)
WBC: 10.8 10*3/uL — ABNORMAL HIGH (ref 4.0–10.5)
nRBC: 0 % (ref 0.0–0.2)

## 2020-03-22 LAB — PROTIME-INR
INR: 1.4 — ABNORMAL HIGH (ref 0.8–1.2)
Prothrombin Time: 16.7 seconds — ABNORMAL HIGH (ref 11.4–15.2)

## 2020-03-22 LAB — APTT: aPTT: 29 seconds (ref 24–36)

## 2020-03-22 LAB — MAGNESIUM: Magnesium: 2 mg/dL (ref 1.7–2.4)

## 2020-03-22 MED FILL — ERLOTINIB HCL 150 MG TABS: 150 | 30 days supply | Qty: 30 | Fill #1

## 2020-03-22 NOTE — Consult Note (Addendum)
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 She was brought to the [facility] ED by her family for altered mentation. I was asked to review her ECG, which shows new TWI in the septal leads with a prolonged QTc and prominent U-waves. Overall, her ECG and presenting symptoms are most concerning for elevated intracranial pressure given her history of recurrent brain mets and treatment with Xarelto. She also recently underwent coronary angiography for work-up of her cardiomyopathy the only showed non-obstructive circumflex disease. Similar ECG changes can also be due to other non-ischemic conditions such as stress cardiomyopathy, electrolyte abnormalities, myocarditis, pheochromocytoma, etc. Labs have been drawn and are pending. Would strongly consider imaging to rule out an intracranial process. Given all of the above and lack of chest pain ischemia seems less likely and given her extremely poor functional status and prognosis, I would have a very high threshold for further ischemic evaluation.

## 2020-03-22 NOTE — Chronic Care Management (AMB) (Signed)
Chronic Care Management    Social Work Follow Up Note  03/22/2020 Name: Valerie [name] MRN: [id number] DOB: May 13, 1946  Valerie [name] is a 74 y.o. year old female who is a primary care patient of [name], MD. The CCM team was consulted for assistance with care coordination.   Review of patient status, including review of consultants reports, other relevant assessments, and collaboration with appropriate care team members and the patient's provider was performed as part of comprehensive patient evaluation and provision of chronic care management services.    SDOH (Social Determinants of Health) assessments performed: No    Outpatient Encounter Medications as of 03/22/2020  Medication Sig Note  . ammonium lactate (LAC-HYDRIN) 12 % lotion Apply topically 2 (two) times daily.   . Ascorbic Acid (VITAMIN C) 100 MG tablet Take 100 mg by mouth every evening. Gummie   . atorvastatin (LIPITOR) 80 MG tablet Take 1 tablet (80 mg total) by mouth at bedtime.   . clopidogrel (PLAVIX) 75 MG tablet Take 1 tablet (75 mg total) by mouth daily with breakfast.   . erlotinib (TARCEVA) 150 MG tablet Take 1 tablet (150 mg total) by mouth daily. Take on an empty stomach 1 hour before meals or 2 hours after. (Patient not taking: Reported on 02/24/2020)   . furosemide (LASIX) 20 MG tablet Take 20 mg by mouth as needed.   [name] MAGNESIUM PO Take 1 tablet by mouth daily.    . metoprolol succinate (TOPROL-XL) 25 MG 24 hr tablet Take 1 tablet (25 mg total) by mouth daily. (Patient not taking: Reported on 02/24/2020) 02/23/2020: On hold  . Multiple Vitamin (MULTI-VITAMINS) TABS Take 1 tablet by mouth every evening.    . polyethylene glycol (MIRALAX / GLYCOLAX) 17 g packet Take 17 g by mouth daily.   . potassium chloride SA (KLOR-CON) 20 MEQ tablet Take 2 tablets (40 mEq total) by mouth daily.   [name] saccharomyces boulardii (FLORASTOR) 250 MG capsule Take 1 capsule (250 mg total) by mouth 2 (two) times daily.   .  vitamin B-12 (CYANOCOBALAMIN) 1000 MCG tablet Take 1,000 mcg by mouth every evening.   [name] 20 MG TABS tablet Take 1 tablet (20 mg total) by mouth every evening. 02/15/2020: PT reported LD of 6/27 ~11AM   No facility-administered encounter medications on file as of 03/22/2020.     Goals Addressed              This Visit's Progress     Patient Stated   .  COMPLETED: "I need help with transportation" (pt-stated)        [place] (see longitudinal plan of care for additional care plan information)  Current Barriers:  . Financial constraints related to cost of wheelchair transportation . Lacks knowledge of community resource: [facility] . Chronic conditions including  Malignant Neoplasm of upper lobe of right lung and Dementia w/o behavior disturbance which impact patient ability to perform ADL's and iADL's  Social Work Clinical Goal(s):  [name] Over the next 45 days the patient will work with SW to become more knowledgeable of [facility] resource to assist with transportation needs  CCM SW Interventions: Completed 03/22/20 with Valerie [name] . Successful outbound call placed to Valerie [name] to follow up on goal progression . Advised Valerie [name] patients application was received by [place] and was awaiting determination o Valerie [name] understands he will receive contact via mail regarding patient approval status . SW encouraged Valerie [name] to contact SW directly as  needed with future transportation needs . Goal Met  Completed 03/15/20 . Collaboration with Valerie [name] with [place] (formerly [employer]) to confirm transportation application received . Successful outbound call placed to the patient to update on goal progression . Advised the patient she will receive approval notice via mail indicating whether or not the patient is approved and length of approval . Scheduled follow up over the next two weeks to assess goal progression  Completed 03/01/20 . Inter-disciplinary care team  collaboration (see longitudinal plan of care) . Successful outbound call placed to the patient to conduct an SDOH (social determinants of health) screen . Determined the patient has difficulty affording wheelchair accessible transportation . Provided education on transportation resource: [facility] . Initiated [facility] application on behalf of the patient . Advised the patient, SW would send to her provider for completion of Part B prior to submission . Scheduled follow up call over the next two weeks to follow up on goal progression  Patient Self Care Activities:  . Patient verbalizes understanding of plan to work with SW to obtain [facility] approval . Self administers medications as prescribed . Calls pharmacy for medication refills . Calls provider office for new concerns or questions . Does not attend all scheduled provider appointments due to transportation barriers  Please see past updates related to this goal by clicking on the "Past Updates" button in the selected goal      .  "I want to be placed so I can walk again" (pt-stated)   On track     [place] (see longitudinal plan of care for additional care plan information)  Current Barriers:  . Inability to perform ADL's independently . Inability to perform IADL's independently . Wheelchair bound, has not walked in approximately 1 year . Chronic conditions including Dementia and Malignant Neoplasm of upper lobe of right lung  Social Work Clinical Goal(s):  [name] Over the next 90 days the patient will work with SW and primary care provider to determine if the patient is eligible for inpatient rehab to meet the patients goal to walk again  CCM SW Interventions: Completed 03/22/20 with patient spouse, Valerie [name] . Successful outbound call to Valerie [name] to follow up on patient placement needs . Determined the patient has an appointment on 8/4 at 10:00 am with [facility] regarding placement to address rehab needs . Advised Mr.  [name] SW will follow up on outcome of appointment to determine if SW assistance is needed . Scheduled follow up call on 03/24/20  Completed 03/15/20 with the patient . Inter-disciplinary care team collaboration (see longitudinal plan of care) . Successful outbound call placed to the patient to assess for care coordination needs . Determined the patient is wanting placement into [facility] in hopes of meeting goal to "walk again" . Assessed for patient interest in long-term placement versus acute placement o The patient stated "I want to be there until I can walk again" . Advised the patient SW would collaborate with her primary care provider regarding patient goal for placement . Collaboration with Valerie [name] to determine patient placement needs . Scheduled follow up call to the patient over the next two weeks  Patient Self Care Activities:  . Patient verbalizes understanding of plan to work with SW and primary care provider to determine placement needs . Calls pharmacy for medication refills . Supportive husband to assist with patient care needs . Unable to perform ADLs independently . Unable to perform IADLs independently  Please see past  updates related to this goal by clicking on the "Past Updates" button in the selected goal          Follow Up Plan: Appointment scheduled for SW follow up with client by phone on: 8/5.  [name], BSW, CDP Social Worker, Certified Dementia Practitioner [place] [facility] [place] [facility] [phone]  Total time spent performing care coordination and/or care management activities with the patient by phone or face to face = 13 minutes.

## 2020-03-22 NOTE — ED Provider Notes (Signed)
[facility] DEPT Provider Note   CSN: [id number] Arrival date & time: 03/22/20  2130     History No chief complaint on file.   Valerie [name] is a 74 y.o. female.  Patient presents to the ED with a chief complaint of AMS.  Per [facility], family called due to foul smelling urine and confusion.  She was reportedly acting differently after having taken 50mg  of benadryl.  She has hx of UTIs and lung CA.  Currently back to baseline.  Patient states that earlier she was shaking back and forth.  She also reports that she is having some lower abdominal pain.  She denies any known fever.  She states that she is currently getting chemotherapy for lung cancer.  She denies any new or worsening cough.  She denies any other associated symptoms.  She does note that she had a mole removed from her left breast, but denies any redness or discharge.  The history is provided by the patient. No language interpreter was used.       Past Medical History:  Diagnosis Date  . Abnormal Pap smear 2006  . Anal fistula   . Ankle fracture   . Anxiety   . Arthritis   . Atrophic vaginitis 2008  . Cataract   . Dementia ([place]) 2009  . Dyspareunia 2008  . H/O osteoporosis   . H/O varicella   . H/O vitamin D deficiency   . Headache 07/24/2016  . Heart murmur   . History of measles, mumps, or rubella   . History of radiation therapy 07/28/13- 08/10/13   right lung metastasis 5000 cGy 10 sessions  . Hypertension   . Lung cancer ([place]) dx'd 2002  . Lung cancer ([place])   . Lung cancer ([place])   . Lung metastasis ([place])    PET scan 05/05/13, RUL lung nodule  . 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 Nodule of right lung CT- 06/03/12   RIGHT UPPER LOBE  . Nodule of right lung 06/03/12   Upper Lobe  . On antineoplastic chemotherapy    TARCEVA  . Osteoporosis 2010  . Primary cancer of right upper lobe of lung ([place]) 04/22/2009   Qualifier:  Diagnosis of  By: Nils Pyle CMA ([place]), Mearl Latin    . Shortness of breath    hx lung ca   . Status post chemotherapy 2003   CARBOPLATIN/PACLITAXEL /STATUS POST CLINICAL TRAIL OF CELEBREX AND IRESSA AT [facility] FOR 1 YEAR  . Status post radiation therapy 2003   LEFT LUNG  . Status post radiation therapy 11/07/2005   WHOLE BRAIN: DR Larkin [facility]  . Status post radiation therapy 06/02/2008   GAMMA KNIFE OF RESECTED CAVITAY  . Thyroid adenoma    ?  [name] Thyroid cancer ([place]) 10/18/11 bx   adenoid nodules   . Yeast infection     Patient Active Problem List   Diagnosis Date Noted  . Acute combined systolic and diastolic heart failure ([place])   . Elevated troponin   . Dilated cardiomyopathy ([place])   . Coronary artery disease involving native coronary artery of native heart without angina pectoris   . Congestive heart failure (CHF) ([place]) 02/14/2020  . Pyelonephritis 09/10/2019  . Acute blood loss anemia 06/12/2019  . Closed fracture of distal end of right femur, initial encounter ([place]) 06/11/2019  . Closed fracture of lateral portion of right tibial plateau 06/11/2019  . History of DVT of lower  extremity 06/11/2019  . Blurry vision 01/06/2019  . Headache 07/24/2016  . Cough 08/24/2015  . Chronic diarrhea 08/24/2015  . Sepsis ([place])   . Encounter for antineoplastic chemotherapy 01/31/2015  . Urinary retention 10/20/2014  . Ileus ([place]) 10/20/2014  . Cellulitis of leg, right 10/20/2014  . Fracture of right proximal fibula   . Ankle fracture 10/02/2014  . Ankle fracture 10/02/2014  . Lung cancer ([place]) 10/02/2014  . Closed fibular fracture 10/02/2014  . Dementia ([place]) 10/02/2014  . Lower urinary tract infectious disease 10/02/2014  . Reactive airway disease 10/02/2014  . Fall 10/02/2014  . Closed left ankle fracture 10/01/2014  . Right fibular fracture 10/01/2014  . Lung metastasis ([place])   . LESION, ANUS 04/26/2009  . DIARRHEA 04/26/2009  . Primary cancer of right upper lobe of lung ([place])  04/22/2009  . Malignant neoplasm of brain ([place]) 04/22/2009  . VITAMIN D DEFICIENCY 04/22/2009  . ANAL FISTULA 04/22/2009  . OSTEOPOROSIS 04/22/2009    Past Surgical History:  Procedure Laterality Date  . BRAIN SURGERY    . INTRAVASCULAR ULTRASOUND/IVUS N/A 02/16/2020   Procedure: Intravascular Ultrasound/IVUS;  Surgeon: Leonie Man, MD;  Location: [facility];  Service: Cardiovascular;  Laterality: N/A;  . LEFT LOWER LOBECTOMY  05/2001  . LEFT OCCIPITAL CRANIOTOMY  09/21/2005   tumor  . LUNG LOBECTOMY    . NECK SURGERY    . ORIF ANKLE FRACTURE Left 10/02/2014   Procedure: OPEN REDUCTION INTERNAL FIXATION (ORIF) ANKLE FRACTURE;  Surgeon: Alta Corning, MD;  Location: [facility];  Service: Orthopedics;  Laterality: Left;  . ORIF FEMUR FRACTURE Right 06/11/2019   Procedure: OPEN REDUCTION INTERNAL FIXATION (ORIF) DISTAL FEMUR FRACTURE;  Surgeon: Renette Butters, MD;  Location: [facility];  Service: Orthopedics;  Laterality: Right;  . ORIF TIBIA PLATEAU Right 06/11/2019   Procedure: Open Reduction Internal Fixation (Orif) Tibial Plateau;  Surgeon: Renette Butters, MD;  Location: [facility];  Service: Orthopedics;  Laterality: Right;  . RADICAL NECK DISSECTION  10/18/2011   Procedure: RADICAL NECK DISSECTION;  Surgeon: Izora Gala, MD;  Location: [facility];  Service: ENT;  Laterality: Right;  RIGHT MODIFIED NECK DISSECTION /POSSIBLE RIGHT THYROIDECTOM  . RIGHT/LEFT HEART CATH AND CORONARY ANGIOGRAPHY N/A 02/16/2020   Procedure: RIGHT/LEFT HEART CATH AND CORONARY ANGIOGRAPHY;  Surgeon: Leonie Man, MD;  Location: [facility];  Service: Cardiovascular;  Laterality: N/A;  . THYROIDECTOMY  10/18/2011   Procedure: THYROIDECTOMY WITH RADICAL NECK DISSECTION;  Surgeon: Izora Gala, MD;  Location: [facility];  Service: ENT;  Laterality: Right;     OB History    Gravida  2   Para  0   Term  0   Preterm  0   AB  0   Living  2     SAB  0   TAB  0   Ectopic  0   Multiple      Live  Births  2           Family History  Problem Relation Age of Onset  . Gait disorder Mother   . Cancer Mother        Meningioma  . Cancer Father        Pancreatic  . Heart failure Sister   . Cancer Maternal Aunt        menigeoma    Social History   Tobacco Use  . Smoking status: Never Smoker  . Smokeless tobacco: Never Used  . Tobacco comment:  smoked few years in college  Vaping Use  . Vaping Use: Never used  Substance Use Topics  . Alcohol use: No  . Drug use: No    Home Medications Prior to Admission medications   Medication Sig Start Date End Date Taking? Authorizing Provider  ammonium lactate (LAC-HYDRIN) 12 % lotion Apply topically 2 (two) times daily. 02/18/20   Nita Sells, MD  Ascorbic Acid (VITAMIN C) 100 MG tablet Take 100 mg by mouth every evening. Gummie    [provider]  atorvastatin (LIPITOR) 80 MG tablet Take 1 tablet (80 mg total) by mouth at bedtime. 02/18/20   Nita Sells, MD  clopidogrel (PLAVIX) 75 MG tablet Take 1 tablet (75 mg total) by mouth daily with breakfast. 02/19/20   Nita Sells, MD  erlotinib (TARCEVA) 150 MG tablet Take 1 tablet (150 mg total) by mouth daily. Take on an empty stomach 1 hour before meals or 2 hours after. Patient not taking: Reported on 02/24/2020 02/23/20   Curt Bears, MD  furosemide (LASIX) 20 MG tablet Take 20 mg by mouth as needed.    [provider]  MAGNESIUM PO Take 1 tablet by mouth daily.     [provider]  metoprolol succinate (TOPROL-XL) 25 MG 24 hr tablet Take 1 tablet (25 mg total) by mouth daily. Patient not taking: Reported on 02/24/2020 02/19/20   Nita Sells, MD  Multiple Vitamin (MULTI-VITAMINS) TABS Take 1 tablet by mouth every evening.     [provider]  polyethylene glycol (MIRALAX / GLYCOLAX) 17 g packet Take 17 g by mouth daily. 09/19/19   Elie Confer, MD  potassium chloride SA (KLOR-CON) 20 MEQ tablet Take 2 tablets (40 mEq  total) by mouth daily. 02/19/20   Nita Sells, MD  saccharomyces boulardii (FLORASTOR) 250 MG capsule Take 1 capsule (250 mg total) by mouth 2 (two) times daily. 03/03/20   [name], MD  vitamin B-12 (CYANOCOBALAMIN) 1000 MCG tablet Take 1,000 mcg by mouth every evening.    [provider]  XARELTO 20 MG TABS tablet Take 1 tablet (20 mg total) by mouth every evening. 12/15/19   [name], MD    Allergies    Keflex [cephalexin]  Review of Systems   Review of Systems  All other systems reviewed and are negative.   Physical Exam Updated Vital Signs There were no vitals taken for this visit.  Physical Exam Vitals and nursing note reviewed.  Constitutional:      General: She is not in acute distress.    Appearance: She is well-developed.  HENT:     Head: Normocephalic and atraumatic.  Eyes:     Conjunctiva/sclera: Conjunctivae normal.  Cardiovascular:     Rate and Rhythm: Normal rate and regular rhythm.     Heart sounds: No murmur heard.   Pulmonary:     Effort: Pulmonary effort is normal. No respiratory distress.     Breath sounds: Normal breath sounds. No wheezing or rales.  Abdominal:     Palpations: Abdomen is soft.     Tenderness: There is no abdominal tenderness.  Musculoskeletal:     Cervical back: Neck supple.  Skin:    General: Skin is warm and dry.     Comments: Wound from recent mole excision on left breast, clean and dry, no evidence of cellulitis or abscess  Neurological:     Mental Status: She is alert and oriented to person, place, and time.     Comments: CN 3-12 intact  Speech is clear Movements are goal oriented Answers questions appropriately  Psychiatric:        Mood and Affect: Mood normal.        Behavior: Behavior normal.        Thought Content: Thought content normal.        Judgment: Judgment normal.     ED Results / Procedures / Treatments   Labs (all labs ordered are listed, but only abnormal results are  displayed) Labs Reviewed  CULTURE, BLOOD (SINGLE)  URINE CULTURE  SARS CORONAVIRUS 2 BY RT PCR (HOSPITAL ORDER, [facility])  LACTIC ACID, PLASMA  LACTIC ACID, PLASMA  COMPREHENSIVE METABOLIC PANEL  CBC WITH DIFFERENTIAL/PLATELET  PROTIME-INR  APTT  URINALYSIS, ROUTINE W REFLEX MICROSCOPIC    EKG EKG Interpretation  Date/Time:  Tuesday March 22 2020 23:11:36 EDT Ventricular Rate:  63 PR Interval:    QRS Duration: 86 QT Interval:  559 QTC Calculation: 573 R Axis:   21 Text Interpretation: Sinus rhythm Short PR interval Abnormal R-wave progression, early transition Abnrm T, probable ischemia, anterolateral lds Prolonged QT interval Confirmed by Madalyn Rob [id number]) on 03/22/2020 11:14:10 PM   Radiology No results found.  Procedures Procedures (including critical care time)  Medications Ordered in ED Medications - No data to display  ED Course  I have reviewed the triage vital signs and the nursing notes.  Pertinent labs & imaging results that were available during my care of the patient were reviewed by me and considered in my medical decision making (see chart for details).    MDM Rules/Calculators/A&P                          This patient complains of confusion, UTI, this involves an extensive number of treatment options, and is a complaint that carries with it a high risk of complications and morbidity.  The differential diagnosis includes urosepsis, hypoglycemia, stroke.  Pertinent Labs K 2.8, supplemented in ED, mild leukocytosis of 10.8, trop 67->84, lactic normal.  Imaging Interpretation I ordered imaging studies which included CXR and CT head, which showed marked severity elevation of the left hemidiaphragm with mild left basilar atelectasis, small left pleural effusion, and postsurgical changes overlying the upper left lung.  CT head showed no CT evidence of acute intracranial abnormality.  There is atrophy of chronic small vessel  ischemic changes of the white matter, previous left posterior craniotomy with left greater than right occipital gyral calcifications.  Medications I ordered medication potassium for hypokalemia.  Sources Additional history obtained from daughter, husband, and EMS. Previous records obtained and reviewed recent heart catheterization on 6/29 which showed moderate disease, thought to be nonischemic.  Consultants 2420- Patient discussed with cardiology fellow, Dr. Kalman Shan, regarding abnormal EKG.  Very prolonged QT and abnormal T-waves V1/V2.  Dr. Kalman Shan, states that the EKG findings can be indicative of increased intracranial pressure and recommends CT head.  Could also be Takatsubo, myocarditis, or electrolyte derangement, but states that it doesn't appear ischemic.   Reassessments After the interventions stated above, I reevaluated the patient and found with stable vitals.  Alert and oriented.  Answering questions appropriately.  Agreeable with plan for admission and observation.  Plan also discussed with family members.  Patient seen by discussed with Dr. Roslynn Amble.  Final Clinical Impression(s) / ED Diagnoses Final diagnoses:  Hypokalemia  Altered mental status, unspecified altered mental status type  Urinary tract infection without hematuria, site unspecified  Prolonged Q-T  interval on ECG    Rx / DC Orders ED Discharge Orders    None       Montine Circle, PA-C 03/23/20 0238    Lucrezia Starch, MD 03/23/20 [phone]

## 2020-03-22 NOTE — Patient Instructions (Signed)
Social Worker Visit Information  Goals we discussed today:  Goals Addressed              This Visit's Progress     Patient Stated   .  COMPLETED: "I need help with transportation" (pt-stated)        [place] (see longitudinal plan of care for additional care plan information)  Current Barriers:  . Financial constraints related to cost of wheelchair transportation . Lacks knowledge of community resource: [facility] . Chronic conditions including  Malignant Neoplasm of upper lobe of right lung and Dementia w/o behavior disturbance which impact patient ability to perform ADL's and iADL's  Social Work Clinical Goal(s):  [name] Over the next 45 days the patient will work with SW to become more knowledgeable of SCAT resource to assist with transportation needs  CCM SW Interventions: Completed 03/22/20 with Cathlean Cower . Successful outbound call placed to Mr. Tamayo to follow up on goal progression . Advised Mr. Pla patients application was received by [facility] and was awaiting determination o Mr. Millican understands he will receive contact via mail regarding patient approval status . SW encouraged Mr. Loor to contact SW directly as needed with future transportation needs . Goal Met  Completed 03/15/20 . Collaboration with Almedia Balls with [facility] (formerly [employer]) to confirm transportation application received . Successful outbound call placed to the patient to update on goal progression . Advised the patient she will receive approval notice via mail indicating whether or not the patient is approved and length of approval . Scheduled follow up over the next two weeks to assess goal progression  Completed 03/01/20 . Inter-disciplinary care team collaboration (see longitudinal plan of care) . Successful outbound call placed to the patient to conduct an SDOH (social determinants of health) screen . Determined the patient has difficulty affording wheelchair accessible  transportation . Provided education on transportation resource: [facility] . Initiated SC[facility] application on behalf of the patient . Advised the patient, SW would send to her provider for completion of Part B prior to submission . Scheduled follow up call over the next two weeks to follow up on goal progression  Patient Self Care Activities:  . Patient verbalizes understanding of plan to work with SW to obtain SCAT approval . Self administers medications as prescribed . Calls pharmacy for medication refills . Calls provider office for new concerns or questions . Does not attend all scheduled provider appointments due to transportation barriers  Please see past updates related to this goal by clicking on the "Past Updates" button in the selected goal      .  "I want to be placed so I can walk again" (pt-stated)   On track     [place] (see longitudinal plan of care for additional care plan information)  Current Barriers:  . Inability to perform ADL's independently . Inability to perform IADL's independently . Wheelchair bound, has not walked in approximately 1 year . Chronic conditions including Dementia and Malignant Neoplasm of upper lobe of right lung  Social Work Clinical Goal(s):  [name] Over the next 90 days the patient will work with SW and primary care provider to determine if the patient is eligible for inpatient rehab to meet the patients goal to walk again  CCM SW Interventions: Completed 03/22/20 with patient spouse, Mckinsley Koelzer . Successful outbound call to Mr. Cherian to follow up on patient placement needs . Determined the patient has an appointment on 8/4 at 10:00  am with [facility] regarding placement to address rehab needs . Advised Mr. Esson SW will follow up on outcome of appointment to determine if SW assistance is needed . Scheduled follow up call on 03/24/20  Completed 03/15/20 with the patient . Inter-disciplinary care team collaboration (see longitudinal  plan of care) . Successful outbound call placed to the patient to assess for care coordination needs . Determined the patient is wanting placement into [facility] in hopes of meeting goal to "walk again" . Assessed for patient interest in long-term placement versus acute placement o The patient stated "I want to be there until I can walk again" . Advised the patient SW would collaborate with her primary care provider regarding patient goal for placement . Collaboration with Dr. Baird Cancer to determine patient placement needs . Scheduled follow up call to the patient over the next two weeks  Patient Self Care Activities:  . Patient verbalizes understanding of plan to work with SW and primary care provider to determine placement needs . Calls pharmacy for medication refills . Supportive husband to assist with patient care needs . Unable to perform ADLs independently . Unable to perform IADLs independently  Please see past updates related to this goal by clicking on the "Past Updates" button in the selected goal          Follow Up Plan: Appointment scheduled for SW follow up with client by phone on: 8/5   [name], [place], CDP Social Worker, Certified Dementia Practitioner [facility] [phone]

## 2020-03-22 NOTE — ED Triage Notes (Signed)
Per [facility], family called due to foul smelling urine and AMS, family states pt starting acting different after giving 50mg  of benadryl, hx UTI and lung CA, per family she is at baseline currently

## 2020-03-23 ENCOUNTER — Ambulatory Visit: Payer: Medicare PPO

## 2020-03-23 DIAGNOSIS — G9341 Metabolic encephalopathy: Secondary | ICD-10-CM

## 2020-03-23 DIAGNOSIS — R404 Transient alteration of awareness: Secondary | ICD-10-CM | POA: Diagnosis not present

## 2020-03-23 DIAGNOSIS — B9562 Methicillin resistant Staphylococcus aureus infection as the cause of diseases classified elsewhere: Secondary | ICD-10-CM | POA: Diagnosis present

## 2020-03-23 DIAGNOSIS — R7881 Bacteremia: Secondary | ICD-10-CM | POA: Diagnosis present

## 2020-03-23 DIAGNOSIS — N390 Urinary tract infection, site not specified: Principal | ICD-10-CM

## 2020-03-23 DIAGNOSIS — R778 Other specified abnormalities of plasma proteins: Secondary | ICD-10-CM

## 2020-03-23 DIAGNOSIS — C719 Malignant neoplasm of brain, unspecified: Secondary | ICD-10-CM

## 2020-03-23 DIAGNOSIS — F0390 Unspecified dementia without behavioral disturbance: Secondary | ICD-10-CM

## 2020-03-23 DIAGNOSIS — R9431 Abnormal electrocardiogram [ECG] [EKG]: Secondary | ICD-10-CM

## 2020-03-23 DIAGNOSIS — Z66 Do not resuscitate: Secondary | ICD-10-CM | POA: Diagnosis not present

## 2020-03-23 DIAGNOSIS — Z86718 Personal history of other venous thrombosis and embolism: Secondary | ICD-10-CM

## 2020-03-23 DIAGNOSIS — C3411 Malignant neoplasm of upper lobe, right bronchus or lung: Secondary | ICD-10-CM

## 2020-03-23 DIAGNOSIS — E876 Hypokalemia: Secondary | ICD-10-CM

## 2020-03-23 DIAGNOSIS — I5042 Chronic combined systolic (congestive) and diastolic (congestive) heart failure: Secondary | ICD-10-CM

## 2020-03-23 DIAGNOSIS — R4182 Altered mental status, unspecified: Secondary | ICD-10-CM | POA: Diagnosis present

## 2020-03-23 LAB — BLOOD CULTURE ID PANEL (REFLEXED) - BCID2

## 2020-03-23 LAB — URINALYSIS, ROUTINE W REFLEX MICROSCOPIC
Bilirubin Urine: NEGATIVE
Glucose, UA: NEGATIVE mg/dL
Ketones, ur: NEGATIVE mg/dL
Leukocytes,Ua: NEGATIVE
Nitrite: POSITIVE — AB
Protein, ur: 30 mg/dL — AB
Specific Gravity, Urine: 1.015 (ref 1.005–1.030)
pH: 6 (ref 5.0–8.0)

## 2020-03-23 LAB — URINE CULTURE: Culture: <10000 — AB

## 2020-03-23 LAB — CBC
HCT: 30.8 % — ABNORMAL LOW (ref 36.0–46.0)
Hemoglobin: 9.5 g/dL — ABNORMAL LOW (ref 12.0–15.0)
MCH: 29.2 pg (ref 26.0–34.0)
MCHC: 30.8 g/dL (ref 30.0–36.0)
MCV: 94.8 fL (ref 80.0–100.0)
Platelets: 251 10*3/uL (ref 150–400)
RBC: 3.25 MIL/uL — ABNORMAL LOW (ref 3.87–5.11)
RDW: 15.7 % — ABNORMAL HIGH (ref 11.5–15.5)
WBC: 8.2 10*3/uL (ref 4.0–10.5)
nRBC: 0 % (ref 0.0–0.2)

## 2020-03-23 LAB — TROPONIN I (HIGH SENSITIVITY)
Troponin I (High Sensitivity): 67 ng/L — ABNORMAL HIGH (ref <18)
Troponin I (High Sensitivity): 84 ng/L — ABNORMAL HIGH (ref <18)
Troponin I (High Sensitivity): 32 ng/L — ABNORMAL HIGH (ref <18)

## 2020-03-23 LAB — COMPREHENSIVE METABOLIC PANEL
ALT: 22 U/L (ref 0–44)
AST: 29 U/L (ref 15–41)
Albumin: 3.6 g/dL (ref 3.5–5.0)
Alkaline Phosphatase: 82 U/L (ref 38–126)
Anion gap: 8 (ref 5–15)
BUN: 13 mg/dL (ref 8–23)
CO2: 26 mmol/L (ref 22–32)
Calcium: 9 mg/dL (ref 8.9–10.3)
Chloride: 106 mmol/L (ref 98–111)
Creatinine, Ser: 0.9 mg/dL (ref 0.44–1.00)
GFR calc Af Amer: >60 mL/min (ref >60)
GFR calc non Af Amer: >60 mL/min (ref >60)
Glucose, Bld: 142 mg/dL — ABNORMAL HIGH (ref 70–99)
Potassium: 2.8 mmol/L — ABNORMAL LOW (ref 3.5–5.1)
Sodium: 140 mmol/L (ref 135–145)
Total Bilirubin: 1.2 mg/dL (ref 0.3–1.2)
Total Protein: 6 g/dL — ABNORMAL LOW (ref 6.5–8.1)

## 2020-03-23 LAB — BASIC METABOLIC PANEL
Anion gap: 8 (ref 5–15)
BUN: 13 mg/dL (ref 8–23)
CO2: 24 mmol/L (ref 22–32)
Calcium: 8.5 mg/dL — ABNORMAL LOW (ref 8.9–10.3)
Chloride: 106 mmol/L (ref 98–111)
Creatinine, Ser: 0.81 mg/dL (ref 0.44–1.00)
GFR calc Af Amer: >60 mL/min (ref >60)
GFR calc non Af Amer: >60 mL/min (ref >60)
Glucose, Bld: 144 mg/dL — ABNORMAL HIGH (ref 70–99)
Potassium: 3.7 mmol/L (ref 3.5–5.1)
Sodium: 138 mmol/L (ref 135–145)

## 2020-03-23 LAB — LACTIC ACID, PLASMA
Lactic Acid, Venous: 1.7 mmol/L (ref 0.5–1.9)
Lactic Acid, Venous: 1.7 mmol/L (ref 0.5–1.9)

## 2020-03-23 LAB — SARS CORONAVIRUS 2 BY RT PCR (HOSPITAL ORDER, PERFORMED IN ~~LOC~~ HOSPITAL LAB): SARS Coronavirus 2: NEGATIVE

## 2020-03-23 MED ORDER — MAGNESIUM 200 MG PO TABS: ORAL_TABLET | Freq: Every day | ORAL | Status: DC

## 2020-03-23 MED ORDER — POTASSIUM CHLORIDE CRYS ER 20 MEQ PO TBCR
40.0000 meq | EXTENDED_RELEASE_TABLET | Freq: Once | ORAL | Status: AC
  Administered 2020-03-23: 40 meq via ORAL
  Filled 2020-03-23: qty 2

## 2020-03-23 MED ORDER — POTASSIUM CHLORIDE CRYS ER 20 MEQ PO TBCR: 40.0000 meq | EXTENDED_RELEASE_TABLET | Freq: Every day | ORAL | Status: DC

## 2020-03-23 MED ORDER — POLYETHYLENE GLYCOL 3350 17 G PO PACK: 17.0000 g | PACK | Freq: Every day | ORAL | Status: DC | PRN

## 2020-03-23 MED ORDER — VITAMIN B-12 1000 MCG PO TABS
1000.0000 ug | ORAL_TABLET | Freq: Every evening | ORAL | Status: DC
  Administered 2020-03-24 – 2020-03-28 (×5): 1000 ug via ORAL
  Filled 2020-03-23 (×7): qty 1

## 2020-03-23 MED ORDER — SODIUM CHLORIDE 0.9 % IV BOLUS
250.0000 mL | Freq: Once | INTRAVENOUS | Status: AC
  Administered 2020-03-23: 250 mL via INTRAVENOUS

## 2020-03-23 MED ORDER — VANCOMYCIN HCL 750 MG/150ML IV SOLN
750.0000 mg | Freq: Two times a day (BID) | INTRAVENOUS | Status: DC
  Administered 2020-03-24 – 2020-03-29 (×11): 750 mg via INTRAVENOUS
  Filled 2020-03-23 (×13): qty 150

## 2020-03-23 MED ORDER — POTASSIUM CHLORIDE 10 MEQ/100ML IV SOLN: 10.0000 meq | Freq: Once | INTRAVENOUS | Status: DC

## 2020-03-23 MED ORDER — POTASSIUM CHLORIDE 10 MEQ/100ML IV SOLN
10.0000 meq | INTRAVENOUS | Status: AC
  Administered 2020-03-23 (×2): 10 meq via INTRAVENOUS
  Filled 2020-03-23 (×2): qty 100

## 2020-03-23 MED ORDER — ERLOTINIB HCL 150 MG PO TABS: 150.0000 mg | ORAL_TABLET | Freq: Every day | ORAL | Status: DC

## 2020-03-23 MED ORDER — ATORVASTATIN CALCIUM 40 MG PO TABS
80.0000 mg | ORAL_TABLET | Freq: Every day | ORAL | Status: DC
  Administered 2020-03-23 – 2020-03-28 (×6): 80 mg via ORAL
  Filled 2020-03-23 (×6): qty 2

## 2020-03-23 MED ORDER — SODIUM CHLORIDE 0.9 % IV SOLN: INTRAVENOUS | Status: DC

## 2020-03-23 MED ORDER — SODIUM CHLORIDE 0.9 % IV SOLN: INTRAVENOUS | Status: AC

## 2020-03-23 MED ORDER — SODIUM CHLORIDE 0.9 % IV SOLN
1.0000 g | INTRAVENOUS | Status: DC
  Administered 2020-03-23 – 2020-03-24 (×2): 1 g via INTRAVENOUS
  Filled 2020-03-23: qty 10
  Filled 2020-03-23: qty 1

## 2020-03-23 MED ORDER — RIVAROXABAN 20 MG PO TABS
20.0000 mg | ORAL_TABLET | Freq: Every evening | ORAL | Status: DC
  Administered 2020-03-24 – 2020-03-28 (×5): 20 mg via ORAL
  Filled 2020-03-23 (×7): qty 1

## 2020-03-23 MED ORDER — CLOPIDOGREL BISULFATE 75 MG PO TABS
75.0000 mg | ORAL_TABLET | Freq: Every day | ORAL | Status: DC
  Administered 2020-03-23 – 2020-03-29 (×7): 75 mg via ORAL
  Filled 2020-03-23 (×8): qty 1

## 2020-03-23 MED ORDER — POTASSIUM CHLORIDE 20 MEQ PO PACK
40.0000 meq | PACK | Freq: Every day | ORAL | Status: DC
  Administered 2020-03-23 – 2020-03-29 (×7): 40 meq via ORAL
  Filled 2020-03-23 (×7): qty 2

## 2020-03-23 NOTE — ED Notes (Signed)
Purple man reads 2128.

## 2020-03-23 NOTE — Progress Notes (Signed)
Erlotinib (Tarceva) hold criteria  SCr > 1.5x baseline (or > 2 if baseline unknown)  AST or ALT > 3x ULN  Bili > 1.5x ULN  Acute coronary syndrome  Acute CVA  Bullous or exfoliative skin eruption  Gastrointestinal perforation  Unexplained pneumonitis / hypoxemia  Active infection  Hold oral chemo while on antibiotics for possible sepsis/UTI.  Resume once active infection resolved per P&T policy.   Netta Cedars, PharmD, BCPS 03/23/2020@2 :27 AM

## 2020-03-23 NOTE — Progress Notes (Addendum)
I have seen and assessed patient and agree with Dr. Idell Pickles assessment and plan.  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 fib presented with altered mental status.  Patient noted to have recently diagnosed with a UTI and had been on ciprofloxacin.  Patient admitted with a working diagnosis of acute metabolic encephalopathy felt secondary to UTI and likely recent fluoroquinolone with history of brain mets.  Patient pancultured.  Currently on IV Rocephin which we will continue for now pending urine cultures.  Patient also noted to have an elevated troponin felt likely secondary to a demand ischemia/cardiomyopathy from chemotherapy.  Patient seen in consultation by cardiology who have reviewed the chart and EKG changes however feel low suspicion for acute ischemic disease.  Patient with no chest pain or shortness of breath.  QTc prolongation and as such we will avoid QT prolongation medications.  Patient with history of non-small cell lung cancer with mets to the brain and neck currently on Tarceva which we will hold for now in light of acute infection.  Continue Xarelto for history of DVT.  BCID concerning for MRSA bacteremia in one of the blood cultures.  Get stat blood cultures x2.  Vancomycin per pharmacy.  Continue IV Rocephin pending urine cultures.  Follow.  No charge.

## 2020-03-23 NOTE — ED Notes (Signed)
Pt provided with meal tray.

## 2020-03-23 NOTE — Progress Notes (Signed)
PHARMACY - PHYSICIAN COMMUNICATION CRITICAL VALUE ALERT - BLOOD CULTURE IDENTIFICATION (BCID)  Valerie [name] is an 74 y.o. female with metastatic NSCLC on Tarceva who presented to [facility] on [facility]/22/2020 with a chief complaint of altered mental status.  Assessment: 1 set blood cultures drawn, aerobic bottle growing MRSA (include suspected source if known)  Name of physician (or Provider) Contacted:  Dr. Grandville Silos  Current antibiotics: ceftriaxone  Changes to prescribed antibiotics recommended:  Recommendations accepted by provider - start vancomycin   Results for orders placed or performed during the hospital encounter of 03/22/20  Blood Culture ID Panel (Reflexed) (Collected: 03/22/2020 10:58 PM)  Result Value Ref Range   Enterococcus faecalis NOT DETECTED NOT DETECTED   Enterococcus Faecium NOT DETECTED NOT DETECTED   Listeria monocytogenes NOT DETECTED NOT DETECTED   Staphylococcus species DETECTED (A) NOT DETECTED   Staphylococcus aureus (BCID) DETECTED (A) NOT DETECTED   Staphylococcus epidermidis NOT DETECTED NOT DETECTED   Staphylococcus lugdunensis NOT DETECTED NOT DETECTED   Streptococcus species NOT DETECTED NOT DETECTED   Streptococcus agalactiae NOT DETECTED NOT DETECTED   Streptococcus pneumoniae NOT DETECTED NOT DETECTED   Streptococcus pyogenes NOT DETECTED NOT DETECTED   A.calcoaceticus-baumannii NOT DETECTED NOT DETECTED   Bacteroides fragilis NOT DETECTED NOT DETECTED   Enterobacterales NOT DETECTED NOT DETECTED   Enterobacter cloacae complex NOT DETECTED NOT DETECTED   Escherichia coli NOT DETECTED NOT DETECTED   Klebsiella aerogenes NOT DETECTED NOT DETECTED   Klebsiella oxytoca NOT DETECTED NOT DETECTED   Klebsiella pneumoniae NOT DETECTED NOT DETECTED   Proteus species NOT DETECTED NOT DETECTED   Salmonella species NOT DETECTED NOT DETECTED   Serratia marcescens NOT DETECTED NOT DETECTED   Haemophilus influenzae NOT DETECTED NOT DETECTED   Neisseria  meningitidis NOT DETECTED NOT DETECTED   Pseudomonas aeruginosa NOT DETECTED NOT DETECTED   Stenotrophomonas maltophilia NOT DETECTED NOT DETECTED   Candida albicans NOT DETECTED NOT DETECTED   Candida auris NOT DETECTED NOT DETECTED   Candida glabrata NOT DETECTED NOT DETECTED   Candida krusei NOT DETECTED NOT DETECTED   Candida parapsilosis NOT DETECTED NOT DETECTED   Candida tropicalis NOT DETECTED NOT DETECTED   Cryptococcus neoformans/gattii NOT DETECTED NOT DETECTED   Meth resistant mecA/C and MREJ DETECTED (A) NOT DETECTED    Peggyann Juba, PharmD, [place]: [phone] 03/23/2020  8:26 PM

## 2020-03-23 NOTE — ED Notes (Addendum)
Called to give report nurse not able to take report at this time. Asked to speak to charge.

## 2020-03-23 NOTE — Progress Notes (Signed)
Pharmacy Antibiotic Note  Valerie [name] is a 74 y.o. female admitted on 03/22/2020 MRSA bacteremia.  Pharmacy has been consulted for vancomycin dosing.  Plan: Vancomycin 750mg  IV q12h - start after blood cultures have been drawn Check levels as needed Follow up renal function & cultures  Height: 5\' 7"  (170.2 cm) Weight: 73.9 kg (163 lb) IBW/kg (Calculated) : 61.6  Temp (24hrs), Avg:98.7 F (37.1 C), Min:98.7 F (37.1 C), Max:98.7 F (37.1 C)  Recent Labs  Lab 03/22/20 2257 03/22/20 2330 03/23/20 0115 03/23/20 0446  WBC 10.8*  --   --  8.2  CREATININE 0.90  --   --  0.81  LATICACIDVEN  --  1.7 1.7  --     Estimated Creatinine Clearance: 59.3 mL/min (by C-G formula based on SCr of 0.81 mg/dL).    Allergies  Allergen Reactions  . Keflex [Cephalexin] Itching and Rash    Throat swelling- Patient has tolerated Rocephin & Cefdinir since this reaction    Antimicrobials this admission: 8/4 Ceftriaxone >> 8/4 Vancomycin >>  Dose adjustments this admission:  Microbiology results: 8/3 BCx x 1: GPC, BCID = Staph aureus, MRSA 8/3 UCx: 8/4 BCx:   Thank you for allowing pharmacy to be a part of this patient's care.  Peggyann Juba, PharmD, BCPS Pharmacy: [phone] 03/23/2020 8:57 PM

## 2020-03-23 NOTE — ED Notes (Signed)
Unable to give meds at this time. Messaged pharmacy to send up.

## 2020-03-23 NOTE — ED Notes (Signed)
Trop gotten on previous shift just not clicked off

## 2020-03-23 NOTE — ED Notes (Signed)
Called to give report charge n/a to approve bed at this time will call back

## 2020-03-23 NOTE — ED Notes (Signed)
Messaged onc and hospital MD to consult concerning pts husbands request for pt to take at home chemo pill.

## 2020-03-23 NOTE — Chronic Care Management (AMB) (Signed)
  Chronic Care Management   Inpatient Admit Review Note  03/23/2020 Name: Valerie [name] MRN: [id number] DOB: [birth year]  Valerie [name] is a 74 y.o. year old female who is a primary care patient of [name], MD. Valerie [name] is actively engaged with the embedded care management team in the primary care practice and is being followed by RN Case Manager and BSW for assistance with disease management and care coordination needs related to Dementia and malignant neoplasm of upper lobe of right lung.   Valerie [name] is currently admitted to the hospital for evaluation and treatment of Hypokalemia and altered mental status. Current treatment plan is:   Assessment/Plan  AMS Possibly secondary to UTI and hx of brain mets  Has been on oral Cipro for 3 days. Switch to IV Rocephin Follow urine culture Keep in obs with telemetry  Hypokalemia in the setting of EKG changes replete to 4. Mg stable at 2 Keep on telemetry   Elevated troponin mildly elevated likely due to cardiomyopathy/demand ischemia from chemo. Will continue to trend.  Cardiology Dr. Kalman Shan has evaluated EKG changes and differential remains broad but low suspicion for ischemia disease  pt asymptomatic regarding chest pain or any shortness of breath  Prolonged QT QTc of 570 hold any QT prolongation meds. Hold beta blocker.   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 Earlie Server of oncology  DVT prophylaxis: Xarelto Code Status: DNR -Confirmed with pt and daughter  Family Communication: Plan discussed with patient and daughter at bedside. Also spoke with husband over the phone.  disposition Plan: Home with observation Consults called:  Admission status: Observation Status is: Observation  Plan: CM team will collaborate with [facility] and  will follow patient post discharge.    [name], BSW, CDP Social Worker, Certified Dementia Practitioner [place] [facility] [phone]

## 2020-03-23 NOTE — ED Notes (Addendum)
Unable to give meds at this time. Messaged pharmacy to send up.

## 2020-03-23 NOTE — ED Notes (Addendum)
Per Onc MD Julien Nordmann pt is okay to take home dose of medication Tarceva. Husband made aware. Husband states " yes she has already had it today we have been thru this for 14 years".

## 2020-03-23 NOTE — H&P (Signed)
History and Physical    Valerie [name] IDP:[id number] DOB: [birth year] DOA: 03/22/2020  PCP: [name], MD  Patient coming from: Home  I have personally briefly reviewed patient's old medical records in [place]  Chief Complaint: Altered mental status  HPI: Valerie [name] is a 74 y.o. 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  History is obtained from husband phone and daughter at bedside.  Patient was unaware of events surrounding her altered mental status.  Reportedly patient was talking on the phone with her sister today when she fell asleep and dropped her phone.  They later realized she was less responsive and decided to bring her to the ED.  She did receive Benadryl about an hour prior for some generalized itching.  She was diagnosed with UTI with symptoms of dysuria about 3 days ago and has been taking Cipro.  Otherwise no fever.  No nausea, vomiting or worsening diarrhea.Pt continues to have dysuria and notes some lower abdominal pain today.  In the ED, she was afebrile, had mild leukocytosis of 10.8, stable mild normocytic anemia with hemoglobin of 11.4.  EKG had prolonged QTC of 570 and new inverted T waves in V1 to V2 compared to prior on my review.  ED PA discussed these findings with cardiology fellow Dr. Kalman Shan who was concerned about possible elevated intracranial pressure and CT head was obtained and normal.  Differential diagnosis remains broad per cardiology but he has high threshold for any further ischemic evaluation.   Troponin of 67.  Magnesium of 2.  Potassium of 2.8.   Review of Systems:  Constitutional: No Weight Change, No Fever ENT/Mouth: No sore throat, No Rhinorrhea Eyes: No Eye Pain, No Vision  Changes Cardiovascular: No Chest Pain, no SOB Respiratory: No Cough, No Sputum, No Wheezing, no Dyspnea  Gastrointestinal: No Nausea, No Vomiting, No Diarrhea, No Constipation, + Pain Genitourinary: + Urinary Incontinence, No Urgency, No Flank Pain Musculoskeletal: No Arthralgias, No Myalgias Skin: No Skin Lesions, No Pruritus, Neuro: no Weakness, No Numbness Psych: No Anxiety/Panic, No Depression, no decrease appetite Heme/Lymph: No Bruising, No Bleeding  Past Medical History:  Diagnosis Date  . Abnormal Pap smear 2006  . Anal fistula   . Ankle fracture   . Anxiety   . Arthritis   . Atrophic vaginitis 2008  . Cataract   . Dementia ([place]) 2009  . Dyspareunia 2008  . H/O osteoporosis   . H/O varicella   . H/O vitamin D deficiency   . Headache 07/24/2016  . Heart murmur   . History of measles, mumps, or rubella   . History of radiation therapy 07/28/13- 08/10/13   right lung metastasis 5000 cGy 10 sessions  . Hypertension   . Lung cancer ([place]) dx'd 2002  . Lung cancer ([place])   . Lung cancer ([place])   . Lung metastasis ([place])    PET scan 05/05/13, RUL lung nodule  . Metastasis to brain [facility]) dx'd 2008  . 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 Nodule of right lung CT- 06/03/12   RIGHT UPPER LOBE  . Nodule of right lung 06/03/12   Upper Lobe  . On antineoplastic chemotherapy    TARCEVA  . Osteoporosis 2010  . Primary cancer of right upper lobe of lung ([place]) 04/22/2009   Qualifier: Diagnosis  of  By: Nils Pyle CMA ([place]), Mearl Latin    . Shortness of breath    hx lung ca   . Status post chemotherapy 2003   CARBOPLATIN/PACLITAXEL /STATUS POST CLINICAL TRAIL OF CELEBREX AND IRESSA AT [facility] FOR 1 YEAR  . Status post radiation therapy 2003   LEFT LUNG  . Status post radiation therapy 11/07/2005   WHOLE BRAIN: DR Larkin [facility]  . Status post radiation therapy 06/02/2008   GAMMA KNIFE OF RESECTED CAVITAY  . Thyroid adenoma    ?  [name] Thyroid cancer ([place]) 10/18/11 bx   adenoid nodules    . Yeast infection     Past Surgical History:  Procedure Laterality Date  . BRAIN SURGERY    . INTRAVASCULAR ULTRASOUND/IVUS N/A 02/16/2020   Procedure: Intravascular Ultrasound/IVUS;  Surgeon: Leonie Man, MD;  Location: [facility];  Service: Cardiovascular;  Laterality: N/A;  . LEFT LOWER LOBECTOMY  05/2001  . LEFT OCCIPITAL CRANIOTOMY  09/21/2005   tumor  . LUNG LOBECTOMY    . NECK SURGERY    . ORIF ANKLE FRACTURE Left 10/02/2014   Procedure: OPEN REDUCTION INTERNAL FIXATION (ORIF) ANKLE FRACTURE;  Surgeon: Alta Corning, MD;  Location: [facility];  Service: Orthopedics;  Laterality: Left;  . ORIF FEMUR FRACTURE Right 06/11/2019   Procedure: OPEN REDUCTION INTERNAL FIXATION (ORIF) DISTAL FEMUR FRACTURE;  Surgeon: Renette Butters, MD;  Location: [place];  Service: Orthopedics;  Laterality: Right;  . ORIF TIBIA PLATEAU Right 06/11/2019   Procedure: Open Reduction Internal Fixation (Orif) Tibial Plateau;  Surgeon: Renette Butters, MD;  Location: [place];  Service: Orthopedics;  Laterality: Right;  . RADICAL NECK DISSECTION  10/18/2011   Procedure: RADICAL NECK DISSECTION;  Surgeon: Izora Gala, MD;  Location: [facility];  Service: ENT;  Laterality: Right;  RIGHT MODIFIED NECK DISSECTION /POSSIBLE RIGHT THYROIDECTOM  . RIGHT/LEFT HEART CATH AND CORONARY ANGIOGRAPHY N/A 02/16/2020   Procedure: RIGHT/LEFT HEART CATH AND CORONARY ANGIOGRAPHY;  Surgeon: Leonie Man, MD;  Location: [place] [facility];  Service: Cardiovascular;  Laterality: N/A;  . THYROIDECTOMY  10/18/2011   Procedure: THYROIDECTOMY WITH RADICAL NECK DISSECTION;  Surgeon: Izora Gala, MD;  Location: [place];  Service: ENT;  Laterality: Right;     reports that she has never smoked. She has never used smokeless tobacco. She reports that she does not drink alcohol and does not use drugs. Social History  Allergies  Allergen Reactions  . Keflex [Cephalexin] Itching and Rash    Throat swelling    Family History  Problem  Relation Age of Onset  . Gait disorder Mother   . Cancer Mother        Meningioma  . Cancer Father        Pancreatic  . Heart failure Sister   . Cancer Maternal Aunt        menigeoma     Prior to Admission medications   Medication Sig Start Date End Date Taking? Authorizing Provider  ammonium lactate (LAC-HYDRIN) 12 % lotion Apply topically 2 (two) times daily. 02/18/20   Nita Sells, MD  Ascorbic Acid (VITAMIN C) 100 MG tablet Take 100 mg by mouth every evening. Gummie    [provider]  atorvastatin (LIPITOR) 80 MG tablet Take 1 tablet (80 mg total) by mouth at bedtime. 02/18/20   Nita Sells, MD  clopidogrel (PLAVIX) 75 MG tablet Take 1 tablet (75 mg total) by mouth daily with breakfast. 02/19/20   Nita Sells, MD  erlotinib [facility])  150 MG tablet Take 1 tablet (150 mg total) by mouth daily. Take on an empty stomach 1 hour before meals or 2 hours after. Patient not taking: Reported on 02/24/2020 02/23/20   Curt Bears, MD  furosemide (LASIX) 20 MG tablet Take 20 mg by mouth as needed.    [provider]  MAGNESIUM PO Take 1 tablet by mouth daily.     [provider]  metoprolol succinate (TOPROL-XL) 25 MG 24 hr tablet Take 1 tablet (25 mg total) by mouth daily. Patient not taking: Reported on 02/24/2020 02/19/20   Nita Sells, MD  Multiple Vitamin (MULTI-VITAMINS) TABS Take 1 tablet by mouth every evening.     [provider]  polyethylene glycol (MIRALAX / GLYCOLAX) 17 g packet Take 17 g by mouth daily. 09/19/19   Elie Confer, MD  potassium chloride SA (KLOR-CON) 20 MEQ tablet Take 2 tablets (40 mEq total) by mouth daily. 02/19/20   Nita Sells, MD  saccharomyces boulardii (FLORASTOR) 250 MG capsule Take 1 capsule (250 mg total) by mouth 2 (two) times daily. 03/03/20   [name], MD  vitamin B-12 (CYANOCOBALAMIN) 1000 MCG tablet Take 1,000 mcg by mouth every evening.    [provider]  XARELTO  20 MG TABS tablet Take 1 tablet (20 mg total) by mouth every evening. 12/15/19   [name], MD    Physical Exam: Vitals:   03/22/20 2340 03/23/20 0000 03/23/20 0030 03/23/20 0100  BP:  110/60 (!) 118/59 122/60  Pulse: 67 64 69 70  Resp: (!) 25 (!) 23 (!) 25 18  Temp:      TempSrc:      SpO2: 100% 100% 100% 100%  Weight:      Height:        Constitutional: NAD, calm, comfortable, elderly female laying in bed Vitals:   03/22/20 2340 03/23/20 0000 03/23/20 0030 03/23/20 0100  BP:  110/60 (!) 118/59 122/60  Pulse: 67 64 69 70  Resp: (!) 25 (!) 23 (!) 25 18  Temp:      TempSrc:      SpO2: 100% 100% 100% 100%  Weight:      Height:       Eyes: PERRL, lids and conjunctivae normal ENMT: Mucous membranes are moist.  Neck: normal, supple Respiratory: clear to auscultation bilaterally, no wheezing, no crackles. Normal respiratory effort. No accessory muscle use.  Cardiovascular: Regular rate and rhythm, no murmurs / rubs / gallops. No extremity edema.   Abdomen: no tenderness, no masses palpated. Bowel sounds positive.  Musculoskeletal: no clubbing / cyanosis. No joint deformity upper and lower extremities. Good ROM, no contractures. Normal muscle tone.  Skin: no rashes, lesions, ulcers. No induration Neurologic: CN 2-12 grossly intact. Sensation intact. Strength 3/5 in lower extremity.  Patient mostly wheelchair-bound due to weakness from malignancy. Psychiatric: Normal judgment and insight. Alert and oriented x 3. Normal mood.     Labs on Admission: I have personally reviewed following labs and imaging studies  CBC: Recent Labs  Lab 03/22/20 2257  WBC 10.8*  NEUTROABS 9.4*  HGB 11.4*  HCT 36.8  MCV 94.8  PLT 962   Basic Metabolic Panel: Recent Labs  Lab 03/22/20 2257 03/22/20 2329  NA 140  --   K 2.8*  --   CL 106  --   CO2 26  --   GLUCOSE 142*  --   BUN 13  --   CREATININE 0.90  --   CALCIUM 9.0  --  MG  --  2.0   GFR: Estimated Creatinine Clearance:  53.3 mL/min (by C-G formula based on SCr of 0.9 mg/dL). Liver Function Tests: Recent Labs  Lab 03/22/20 2257  AST 29  ALT 22  ALKPHOS 82  BILITOT 1.2  PROT 6.0*  ALBUMIN 3.6   No results for input(s): LIPASE, AMYLASE in the last 168 hours. No results for input(s): AMMONIA in the last 168 hours. Coagulation Profile: Recent Labs  Lab 03/22/20 2257  INR 1.4*   Cardiac Enzymes: No results for input(s): CKTOTAL, CKMB, CKMBINDEX, TROPONINI in the last 168 hours. BNP (last 3 results) No results for input(s): PROBNP in the last 8760 hours. HbA1C: No results for input(s): HGBA1C in the last 72 hours. CBG: No results for input(s): GLUCAP in the last 168 hours. Lipid Profile: No results for input(s): CHOL, HDL, LDLCALC, TRIG, CHOLHDL, LDLDIRECT in the last 72 hours. Thyroid Function Tests: No results for input(s): TSH, T4TOTAL, FREET4, T3FREE, THYROIDAB in the last 72 hours. Anemia Panel: No results for input(s): VITAMINB12, FOLATE, FERRITIN, TIBC, IRON, RETICCTPCT in the last 72 hours. Urine analysis:    Component Value Date/Time   COLORURINE YELLOW 02/15/2020 0050   APPEARANCEUR HAZY (A) 02/15/2020 0050   LABSPEC 1.005 02/15/2020 0050   PHURINE 5.0 02/15/2020 0050   GLUCOSEU NEGATIVE 02/15/2020 0050   HGBUR MODERATE (A) 02/15/2020 0050   BILIRUBINUR NEGATIVE 02/15/2020 0050   BILIRUBINUR negative 05/25/2019 1653   KETONESUR NEGATIVE 02/15/2020 0050   PROTEINUR NEGATIVE 02/15/2020 0050   UROBILINOGEN 0.2 05/25/2019 1653   UROBILINOGEN 0.2 10/20/2014 1636   NITRITE POSITIVE (A) 02/15/2020 0050   LEUKOCYTESUR LARGE (A) 02/15/2020 0050    Radiological Exams on Admission: CT Head Wo Contrast  Result Date: 03/22/2020 CLINICAL DATA:  Altered mental status EXAM: CT HEAD WITHOUT CONTRAST TECHNIQUE: Contiguous axial images were obtained from the base of the skull through the vertex without intravenous contrast. COMPARISON:  CT brain 06/11/2019, 09/13/2018 FINDINGS: Brain: No acute  territorial infarction, hemorrhage or intracranial mass. Atrophy and white matter hypodensity consistent with chronic small vessel ischemic change. Stable ventricle size. Gyriform calcifications at the left greater than right occipital lobes, slightly increased. Vascular: No hyperdense vessels.  Scattered carotid calcification Skull: Left posterior craniotomy.  No acute fracture Sinuses/Orbits: No acute finding. Other: None IMPRESSION: 1. No CT evidence for acute intracranial abnormality. 2. Atrophy and chronic small vessel ischemic changes of the white matter. Previous left posterior craniotomy with left greater than right occipital gyral calcifications. Electronically Signed   By: Donavan Foil M.D.   On: 03/22/2020 23:51   DG Chest Port 1 View  Result Date: 03/22/2020 CLINICAL DATA:  Altered mental status with foul-smelling urine. EXAM: PORTABLE CHEST 1 VIEW COMPARISON:  None. FINDINGS: The study is limited secondary to patient rotation. Mild atelectasis is seen within the left lung base. A small left pleural effusion is also noted. Radiopaque surgical clips are seen overlying the medial aspect of the upper left lung. The right lung is clear. No pneumothorax is identified. Marked severity elevation of the left hemidiaphragm is seen. As a result, a large gastric air bubble is seen within the retrocardiac region. The heart size and mediastinal contours are within normal limits. Multiple chronic left-sided rib fractures are noted. IMPRESSION: 1. Marked severity elevation of the left hemidiaphragm with mild left basilar atelectasis. 2. Small left pleural effusion. 3. Postsurgical changes overlying the upper left lung. Electronically Signed   By: Virgina Norfolk M.D.   On: 03/22/2020 23:07  Assessment/Plan  AMS Possibly secondary to UTI and hx of brain mets  Has been on oral Cipro for 3 days. Switch to IV Rocephin Follow urine culture Keep in obs with telemetry  Hypokalemia in the setting of EKG  changes replete to 4. Mg stable at 2 Keep on telemetry   Elevated troponin mildly elevated likely due to cardiomyopathy/demand ischemia from chemo. Will continue to trend.  Cardiology Dr. Kalman Shan has evaluated EKG changes and differential remains broad but low suspicion for ischemia disease  pt asymptomatic regarding chest pain or any shortness of breath  Prolonged QT QTc of 570 hold any QT prolongation meds. Hold beta blocker.   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 Earlie Server of oncology  DVT prophylaxis: Xarelto Code Status: DNR -Confirmed with pt and daughter  Family Communication: Plan discussed with patient and daughter at bedside. Also spoke with husband over the phone.  disposition Plan: Home with observation Consults called:  Admission status: Observation Status is: Observation  The patient remains OBS appropriate and will d/c before 2 midnights.  Dispo: The patient is from: Home              Anticipated d/c is to: Home              Anticipated d/c date is: 1 day              Patient currently is not medically stable to d/c.         Orene Desanctis DO [facility]   If 7PM-7AM, please contact night-coverage [email or web address]   03/23/2020, 1:17 AM

## 2020-03-24 ENCOUNTER — Telehealth: Payer: Self-pay | Admitting: Medical Oncology

## 2020-03-24 ENCOUNTER — Telehealth: Payer: Medicare PPO

## 2020-03-24 ENCOUNTER — Observation Stay (HOSPITAL_COMMUNITY): Payer: Medicare PPO

## 2020-03-24 DIAGNOSIS — E876 Hypokalemia: Secondary | ICD-10-CM | POA: Diagnosis present

## 2020-03-24 DIAGNOSIS — I340 Nonrheumatic mitral (valve) insufficiency: Secondary | ICD-10-CM

## 2020-03-24 DIAGNOSIS — G9341 Metabolic encephalopathy: Secondary | ICD-10-CM | POA: Diagnosis present

## 2020-03-24 DIAGNOSIS — Q211 Atrial septal defect: Secondary | ICD-10-CM | POA: Diagnosis not present

## 2020-03-24 DIAGNOSIS — I351 Nonrheumatic aortic (valve) insufficiency: Secondary | ICD-10-CM | POA: Diagnosis not present

## 2020-03-24 DIAGNOSIS — Z85118 Personal history of other malignant neoplasm of bronchus and lung: Secondary | ICD-10-CM | POA: Diagnosis not present

## 2020-03-24 DIAGNOSIS — I420 Dilated cardiomyopathy: Secondary | ICD-10-CM | POA: Diagnosis present

## 2020-03-24 DIAGNOSIS — D649 Anemia, unspecified: Secondary | ICD-10-CM

## 2020-03-24 DIAGNOSIS — N390 Urinary tract infection, site not specified: Secondary | ICD-10-CM

## 2020-03-24 DIAGNOSIS — I110 Hypertensive heart disease with heart failure: Secondary | ICD-10-CM | POA: Diagnosis present

## 2020-03-24 DIAGNOSIS — B9562 Methicillin resistant Staphylococcus aureus infection as the cause of diseases classified elsewhere: Secondary | ICD-10-CM | POA: Diagnosis present

## 2020-03-24 DIAGNOSIS — F0390 Unspecified dementia without behavioral disturbance: Secondary | ICD-10-CM | POA: Diagnosis present

## 2020-03-24 DIAGNOSIS — C7931 Secondary malignant neoplasm of brain: Secondary | ICD-10-CM | POA: Diagnosis present

## 2020-03-24 DIAGNOSIS — Z86718 Personal history of other venous thrombosis and embolism: Secondary | ICD-10-CM | POA: Diagnosis not present

## 2020-03-24 DIAGNOSIS — I248 Other forms of acute ischemic heart disease: Secondary | ICD-10-CM | POA: Diagnosis present

## 2020-03-24 DIAGNOSIS — C3411 Malignant neoplasm of upper lobe, right bronchus or lung: Secondary | ICD-10-CM | POA: Diagnosis present

## 2020-03-24 DIAGNOSIS — R778 Other specified abnormalities of plasma proteins: Secondary | ICD-10-CM | POA: Diagnosis not present

## 2020-03-24 DIAGNOSIS — Z809 Family history of malignant neoplasm, unspecified: Secondary | ICD-10-CM | POA: Diagnosis not present

## 2020-03-24 DIAGNOSIS — Z66 Do not resuscitate: Secondary | ICD-10-CM | POA: Diagnosis present

## 2020-03-24 DIAGNOSIS — Z85850 Personal history of malignant neoplasm of thyroid: Secondary | ICD-10-CM | POA: Diagnosis not present

## 2020-03-24 DIAGNOSIS — R4182 Altered mental status, unspecified: Secondary | ICD-10-CM

## 2020-03-24 DIAGNOSIS — I471 Supraventricular tachycardia: Secondary | ICD-10-CM | POA: Diagnosis present

## 2020-03-24 DIAGNOSIS — R7881 Bacteremia: Secondary | ICD-10-CM | POA: Diagnosis present

## 2020-03-24 DIAGNOSIS — Z7901 Long term (current) use of anticoagulants: Secondary | ICD-10-CM | POA: Diagnosis not present

## 2020-03-24 DIAGNOSIS — Z8619 Personal history of other infectious and parasitic diseases: Secondary | ICD-10-CM | POA: Diagnosis not present

## 2020-03-24 DIAGNOSIS — Z8249 Family history of ischemic heart disease and other diseases of the circulatory system: Secondary | ICD-10-CM | POA: Diagnosis not present

## 2020-03-24 DIAGNOSIS — M810 Age-related osteoporosis without current pathological fracture: Secondary | ICD-10-CM | POA: Diagnosis present

## 2020-03-24 DIAGNOSIS — I5042 Chronic combined systolic (congestive) and diastolic (congestive) heart failure: Secondary | ICD-10-CM | POA: Diagnosis present

## 2020-03-24 DIAGNOSIS — I38 Endocarditis, valve unspecified: Secondary | ICD-10-CM | POA: Diagnosis not present

## 2020-03-24 DIAGNOSIS — Z20822 Contact with and (suspected) exposure to covid-19: Secondary | ICD-10-CM | POA: Diagnosis present

## 2020-03-24 LAB — BASIC METABOLIC PANEL
Anion gap: 6 (ref 5–15)
BUN: 10 mg/dL (ref 8–23)
CO2: 23 mmol/L (ref 22–32)
Calcium: 8.6 mg/dL — ABNORMAL LOW (ref 8.9–10.3)
Chloride: 107 mmol/L (ref 98–111)
Creatinine, Ser: 0.74 mg/dL (ref 0.44–1.00)
GFR calc Af Amer: >60 mL/min (ref >60)
GFR calc non Af Amer: >60 mL/min (ref >60)
Glucose, Bld: 100 mg/dL — ABNORMAL HIGH (ref 70–99)
Potassium: 3.6 mmol/L (ref 3.5–5.1)
Sodium: 136 mmol/L (ref 135–145)

## 2020-03-24 LAB — CBC WITH DIFFERENTIAL/PLATELET
Abs Immature Granulocytes: 0.02 10*3/uL (ref 0.00–0.07)
Basophils Absolute: 0 10*3/uL (ref 0.0–0.1)
Basophils Relative: 1 %
Eosinophils Absolute: 0.6 10*3/uL — ABNORMAL HIGH (ref 0.0–0.5)
Eosinophils Relative: 9 %
HCT: 34.2 % — ABNORMAL LOW (ref 36.0–46.0)
Hemoglobin: 10.4 g/dL — ABNORMAL LOW (ref 12.0–15.0)
Immature Granulocytes: 0 %
Lymphocytes Relative: 14 %
Lymphs Abs: 0.9 10*3/uL (ref 0.7–4.0)
MCH: 28.6 pg (ref 26.0–34.0)
MCHC: 30.4 g/dL (ref 30.0–36.0)
MCV: 94 fL (ref 80.0–100.0)
Monocytes Absolute: 0.6 10*3/uL (ref 0.1–1.0)
Monocytes Relative: 9 %
Neutro Abs: 4.3 10*3/uL (ref 1.7–7.7)
Neutrophils Relative %: 67 %
Platelets: 253 10*3/uL (ref 150–400)
RBC: 3.64 MIL/uL — ABNORMAL LOW (ref 3.87–5.11)
RDW: 15.8 % — ABNORMAL HIGH (ref 11.5–15.5)
WBC: 6.4 10*3/uL (ref 4.0–10.5)
nRBC: 0 % (ref 0.0–0.2)

## 2020-03-24 LAB — ECHOCARDIOGRAM COMPLETE
Area-P 1/2: 3.21 cm2
Height: 67 in
P 1/2 time: 341 msec
S' Lateral: 3.4 cm
Weight: 2460.8 oz

## 2020-03-24 LAB — MAGNESIUM: Magnesium: 1.9 mg/dL (ref 1.7–2.4)

## 2020-03-24 MED ORDER — LORAZEPAM 2 MG/ML IJ SOLN
0.5000 mg | Freq: Four times a day (QID) | INTRAMUSCULAR | Status: DC | PRN
  Administered 2020-03-24: 0.5 mg via INTRAVENOUS
  Filled 2020-03-24: qty 1

## 2020-03-24 MED ORDER — ONDANSETRON HCL 4 MG/2ML IJ SOLN: 4.0000 mg | Freq: Four times a day (QID) | INTRAMUSCULAR | Status: DC | PRN

## 2020-03-24 NOTE — Progress Notes (Signed)
PROGRESS NOTE    Valerie [name]  QAS:[id number] DOB: 05/29/1946 DOA: 03/22/2020 PCP: [name], MD    Chief Complaint  Patient presents with  . Altered Mental Status  . Fatigue    Brief Narrative:  [name] is a 74 y.o. 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  History is obtained from husband phone and daughter at bedside.  Patient was unaware of events surrounding her altered mental status.  Reportedly patient was talking on the phone with her sister today when she fell asleep and dropped her phone.  They later realized she was less responsive and decided to bring her to the ED.  She did receive Benadryl about an hour prior for some generalized itching.  She was diagnosed with UTI with symptoms of dysuria about 3 days ago and has been taking Cipro.  Otherwise no fever.  No nausea, vomiting or worsening diarrhea.Pt continues to have dysuria and notes some lower abdominal pain today.  In the ED, she was afebrile, had mild leukocytosis of 10.8, stable mild normocytic anemia with hemoglobin of 11.4.  EKG had prolonged QTC of 570 and new inverted T waves in V1 to V2 compared to prior on my review.  ED PA discussed these findings with cardiology fellow Dr. Kalman Shan who was concerned about possible elevated intracranial pressure and CT head was obtained and normal.  Differential diagnosis remains broad per cardiology but he has high threshold for any further ischemic evaluation.   Troponin of 67.  Magnesium of 2.  Potassium of 2.8.  Assessment & Plan:   Principal Problem:   MRSA bacteremia Active Problems:   Primary cancer of right upper lobe of lung ([name])   Malignant neoplasm of brain ([name])   History of DVT of lower  extremity   Congestive heart failure (CHF) (HCC)   Elevated troponin   AMS (altered mental status)   Hypokalemia   Prolonged QT interval   DNR (do not resuscitate)   Normocytic anemia  1 MRSA bacteremia Questionable etiology.  Patient admitted with altered mental status/acute encephalopathy felt initially secondary to UTI as patient with history of brain mets.  Patient pancultured and 1 set of blood cultures initially drawn on admission was positive for MRSA.  Repeat blood cultures x2 obtained prior to initiation of IV vancomycin.  2D echo ordered and pending to rule out endocarditis.  IV Rocephin has been discontinued.  ID consulted who are recommending 2D echo, discontinuation of IV Rocephin as urine cultures with insignificant growth.  Appreciate ID input and recommendations.  2.  Acute metabolic encephalopathy Likely secondary to problem #1 and probable UTI in the setting of brain mets and possibly secondary to fluoroquinolone.  Urine cultures with insignificant growth.  Clinical improvement.  Continue IV vancomycin.  IV Rocephin has been discontinued.  Supportive care.  3.  Hypokalemia .  Potassium at 3.6.  Continue oral daily potassium supplementation. Follow.  4.  Prolonged QT Repeat EKG with QTC prolongation.  Hold any QT prolonging medications.  Beta-blocker on hold.  Keep potassium greater than 4.  Keep magnesium greater than 2.  Repeat EKG in the morning.  5.  History of DVT Continue Xarelto.  6.  Elevated troponin Likely secondary to a demand ischemia.  Cardiology, Dr. Kalman Shan evaluated EKG changes and feels  low suspicion for ischemic disease.  Patient asymptomatic.  Repeat troponins trended down.  2D echo pending due to problem #1.  Follow.  7.  Chronic combined CHF Currently stable.  Euvolemic.  8.  History of non-small cell lung cancer with mets to the brain and neck CT head negative for any acute findings.  Patient on oral chemotherapy.  Patient currently with MRSA  bacteremia and ideally would like to keep patient off Tarceva.  Per RN family insistent and looks like family have contacted the cancer center and per notes in chart Dr. Earlie Server recommending patient be continued on Tarceva.  Will need to discuss with patient and husband as contraindicated in patient an acute infection.  Patient with MRSA bacteremia.  Oncology informed of admission via epic.  9.? uti Patient prior to admission was being treated for UTI with ciprofloxacin.  Urinalysis done on admission nitrite positive negative leukocytes 0-5 WBCs.  Urine cultures with insignificant growth.  Patient was on IV Rocephin which has subsequently been discontinued.   DVT prophylaxis: Xarelto Code Status: DNR Family Communication: Updated patient Disposition:   Status is: Inpatient    Dispo: The patient is from: Home              Anticipated d/c is to: Likely home.  To be determined.              Anticipated d/c date is: To be determined              Patient currently with MRSA bacteremia, on IV antibiotics, history of metastatic lung cancer on chemotherapy.  Not stable for discharge.       Consultants:   ID: Dr. Megan Salon 03/24/2020  Procedures:   2D echo pending  CT head 03/22/2020  Chest x-ray 11/07/2019  Antimicrobials:   IV vancomycin 03/23/2020  IV Rocephin 03/23/2020>>>> 03/24/2020   Subjective: Sitting up in bed.  2D echo about to be done.  Patient states she is feeling better.  Denies any chest pain or shortness of breath.  No abdominal pain.  Tolerating current diet.  Objective: Vitals:   03/23/20 2230 03/24/20 0229 03/24/20 0504 03/24/20 0950  BP: (!) 142/58 (!) 127/59 127/62 138/69  Pulse: 73 74 71 93  Resp: 19 19 19    Temp: 98.3 F (36.8 C) 98 F (36.7 C) (!) 97.2 F (36.2 C) 98.2 F (36.8 C)  TempSrc: Oral Oral Oral Oral  SpO2: 100% 97% 98% 100%  Weight: 69.8 kg     Height:        Intake/Output Summary (Last 24 hours) at 03/24/2020 1242 Last data filed at  03/24/2020 1000 Gross per 24 hour  Intake 1399.56 ml  Output 500 ml  Net 899.56 ml   Filed Weights   03/22/20 2248 03/23/20 2230  Weight: 73.9 kg 69.8 kg    Examination:  General exam: Appears calm and comfortable  Respiratory system: Clear to auscultation. Respiratory effort normal. Cardiovascular system: S1 & S2 heard, RRR. No JVD, murmurs, rubs, gallops or clicks. No pedal edema. Gastrointestinal system: Abdomen is nondistended, soft and nontender. No organomegaly or masses felt. Normal bowel sounds heard. Central nervous system: Alert and oriented. No focal neurological deficits. Extremities: Symmetric 5 x 5 power. Skin: Band-Aid noted around left breast where patient stated had a mole removed. Psychiatry: Judgement and insight appear normal. Mood & affect appropriate.     Data Reviewed: I have personally reviewed following labs and imaging studies  CBC: Recent Labs  Lab 03/22/20 2257 03/23/20 0446  03/24/20 0431  WBC 10.8* 8.2 6.4  NEUTROABS 9.4*  --  4.3  HGB 11.4* 9.5* 10.4*  HCT 36.8 30.8* 34.2*  MCV 94.8 94.8 94.0  PLT 275 251 983    Basic Metabolic Panel: Recent Labs  Lab 03/22/20 2257 03/22/20 2329 03/23/20 0446 03/24/20 0431  NA 140  --  138 136  K 2.8*  --  3.7 3.6  CL 106  --  106 107  CO2 26  --  24 23  GLUCOSE 142*  --  144* 100*  BUN 13  --  13 10  CREATININE 0.90  --  0.81 0.74  CALCIUM 9.0  --  8.5* 8.6*  MG  --  2.0  --  1.9    GFR: Estimated Creatinine Clearance: 60 mL/min (by C-G formula based on SCr of 0.74 mg/dL).  Liver Function Tests: Recent Labs  Lab 03/22/20 2257  AST 29  ALT 22  ALKPHOS 82  BILITOT 1.2  PROT 6.0*  ALBUMIN 3.6    CBG: No results for input(s): GLUCAP in the last 168 hours.   Recent Results (from the past 240 hour(s))  SARS Coronavirus 2 by RT PCR (hospital order, performed in [facility] hospital lab) Nasopharyngeal Nasopharyngeal Swab     Status: None   Collection Time: 03/22/20 10:57 PM    Specimen: Nasopharyngeal Swab  Result Value Ref Range Status   SARS Coronavirus 2 NEGATIVE NEGATIVE Final    Comment: (NOTE) SARS-CoV-2 target nucleic acids are NOT DETECTED.  The SARS-CoV-2 RNA is generally detectable in upper and lower respiratory specimens during the acute phase of infection. The lowest concentration of SARS-CoV-2 viral copies this assay can detect is 250 copies / mL. A negative result does not preclude SARS-CoV-2 infection and should not be used as the sole basis for treatment or other patient management decisions.  A negative result may occur with improper specimen collection / handling, submission of specimen other than nasopharyngeal swab, presence of viral mutation(s) within the areas targeted by this assay, and inadequate number of viral copies (<250 copies / mL). A negative result must be combined with clinical observations, patient history, and epidemiological information.  Fact Sheet for Patients:   StrictlyIdeas.no  Fact Sheet for Healthcare Providers: BankingDealers.co.za  This test is not yet approved or  cleared by the [place] FDA and has been authorized for detection and/or diagnosis of SARS-CoV-2 by FDA under an Emergency Use Authorization (EUA).  This EUA will remain in effect (meaning this test can be used) for the duration of the COVID-19 declaration under Section 564(b)(1) of the Act, 21 U.S.C. section 360bbb-3(b)(1), unless the authorization is terminated or revoked sooner.  Performed at [facility], [place] [address], [place], [place] [address]   Blood culture (routine single)     Status: Abnormal (Preliminary result)   Collection Time: 03/22/20 10:58 PM   Specimen: BLOOD  Result Value Ref Range Status   Specimen Description   Final    BLOOD LEFT WRIST Performed at [place] [address] [place], [place] [address]    Special Requests    Final    BOTTLES DRAWN AEROBIC AND ANAEROBIC Blood Culture adequate volume Performed at [place] [address], [place], [place] [address]    Culture  Setup Time   Final    IN BOTH AEROBIC AND ANAEROBIC BOTTLES GRAM POSITIVE COCCI Organism ID to follow CRITICAL RESULT CALLED TO, READ BACK BY AND VERIFIED WITH: E Maricela Bo  [facility] 03/23/20 2009 JDW    Culture (A)  Final    STAPHYLOCOCCUS AUREUS SUSCEPTIBILITIES TO FOLLOW Performed at [facility], [place] [address] [place], [place] [address]    Report Status PENDING  Incomplete  Blood Culture ID Panel (Reflexed)     Status: Abnormal   Collection Time: 03/22/20 10:58 PM  Result Value Ref Range Status   Enterococcus faecalis NOT DETECTED NOT DETECTED Final   Enterococcus Faecium NOT DETECTED NOT DETECTED Final   Listeria monocytogenes NOT DETECTED NOT DETECTED Final   Staphylococcus species DETECTED (A) NOT DETECTED Final    Comment: CRITICAL RESULT CALLED TO, READ BACK BY AND VERIFIED WITH: E WILLIAMSON PHARMD 03/23/20 2009 JDW    Staphylococcus aureus (BCID) DETECTED (A) NOT DETECTED Final    Comment: Methicillin (oxacillin)-resistant Staphylococcus aureus (MRSA). MRSA is predictably resistant to beta-lactam antibiotics (except ceftaroline). Preferred therapy is vancomycin unless clinically contraindicated. Patient requires contact precautions if  hospitalized. CRITICAL RESULT CALLED TO, READ BACK BY AND VERIFIED WITH: E WILLIAMSON PHARMD 03/23/20 2009 JDW    Staphylococcus epidermidis NOT DETECTED NOT DETECTED Final   Staphylococcus lugdunensis NOT DETECTED NOT DETECTED Final   Streptococcus species NOT DETECTED NOT DETECTED Final   Streptococcus agalactiae NOT DETECTED NOT DETECTED Final   Streptococcus pneumoniae NOT DETECTED NOT DETECTED Final   Streptococcus pyogenes NOT DETECTED NOT DETECTED Final   A.calcoaceticus-baumannii NOT DETECTED NOT DETECTED Final   Bacteroides fragilis NOT DETECTED NOT  DETECTED Final   Enterobacterales NOT DETECTED NOT DETECTED Final   Enterobacter cloacae complex NOT DETECTED NOT DETECTED Final   Escherichia coli NOT DETECTED NOT DETECTED Final   Klebsiella aerogenes NOT DETECTED NOT DETECTED Final   Klebsiella oxytoca NOT DETECTED NOT DETECTED Final   Klebsiella pneumoniae NOT DETECTED NOT DETECTED Final   Proteus species NOT DETECTED NOT DETECTED Final   Salmonella species NOT DETECTED NOT DETECTED Final   Serratia marcescens NOT DETECTED NOT DETECTED Final   Haemophilus influenzae NOT DETECTED NOT DETECTED Final   Neisseria meningitidis NOT DETECTED NOT DETECTED Final   Pseudomonas aeruginosa NOT DETECTED NOT DETECTED Final   Stenotrophomonas maltophilia NOT DETECTED NOT DETECTED Final   Candida albicans NOT DETECTED NOT DETECTED Final   Candida auris NOT DETECTED NOT DETECTED Final   Candida glabrata NOT DETECTED NOT DETECTED Final   Candida krusei NOT DETECTED NOT DETECTED Final   Candida parapsilosis NOT DETECTED NOT DETECTED Final   Candida tropicalis NOT DETECTED NOT DETECTED Final   Cryptococcus neoformans/gattii NOT DETECTED NOT DETECTED Final   Meth resistant mecA/C and MREJ DETECTED (A) NOT DETECTED Final    Comment: CRITICAL RESULT CALLED TO, READ BACK BY AND VERIFIED WITHCristopher Estimable [facility] 03/23/20 2009 JDW Performed at [facility], [address] [place], [place] [address]   Urine culture     Status: Abnormal   Collection Time: 03/23/20  2:22 AM   Specimen: In/Out Cath Urine  Result Value Ref Range Status   Specimen Description   Final    IN/OUT CATH URINE Performed at [place] [address] [place], [place] [address]    Special Requests   Final    NONE Performed at [facility], [place] [address], [place], [place] [address]    Culture (A)  Final    <10,000 COLONIES/mL INSIGNIFICANT GROWTH Performed at [place] [address], [place], [place] [address]    Report  Status 03/23/2020 FINAL  Final  Culture, blood (Routine  X 2) w Reflex to ID Panel     Status: None (Preliminary result)   Collection Time: 03/23/20 11:14 PM   Specimen: BLOOD RIGHT HAND  Result Value Ref Range Status   Specimen Description   Final    BLOOD RIGHT HAND Performed at [facility], [place] [address], [place], [place] [address]    Special Requests   Final    BOTTLES DRAWN AEROBIC ONLY Blood Culture adequate volume Performed at [place] [address], [place], [place] [address]    Culture PENDING  Incomplete   Report Status PENDING  Incomplete  Culture, blood (Routine X 2) w Reflex to ID Panel     Status: None (Preliminary result)   Collection Time: 03/23/20 11:14 PM   Specimen: BLOOD RIGHT WRIST  Result Value Ref Range Status   Specimen Description   Final    BLOOD RIGHT WRIST Performed at [facility], [address] [place], [place] [address]    Special Requests   Final    BOTTLES DRAWN AEROBIC ONLY Blood Culture adequate volume Performed at [place] [address] [place], [place] [address]    Culture PENDING  Incomplete   Report Status PENDING  Incomplete         Radiology Studies: CT Head Wo Contrast  Result Date: 03/22/2020 CLINICAL DATA:  Altered mental status EXAM: CT HEAD WITHOUT CONTRAST TECHNIQUE: Contiguous axial images were obtained from the base of the skull through the vertex without intravenous contrast. COMPARISON:  CT brain 06/11/2019, 09/13/2018 FINDINGS: Brain: No acute territorial infarction, hemorrhage or intracranial mass. Atrophy and white matter hypodensity consistent with chronic small vessel ischemic change. Stable ventricle size. Gyriform calcifications at the left greater than right occipital lobes, slightly increased. Vascular: No hyperdense vessels.  Scattered carotid calcification Skull: Left posterior craniotomy.  No acute fracture Sinuses/Orbits: No acute finding. Other: None  IMPRESSION: 1. No CT evidence for acute intracranial abnormality. 2. Atrophy and chronic small vessel ischemic changes of the white matter. Previous left posterior craniotomy with left greater than right occipital gyral calcifications. Electronically Signed   By: Donavan Foil M.D.   On: 03/22/2020 23:51   DG Chest Port 1 View  Result Date: 03/22/2020 CLINICAL DATA:  Altered mental status with foul-smelling urine. EXAM: PORTABLE CHEST 1 VIEW COMPARISON:  None. FINDINGS: The study is limited secondary to patient rotation. Mild atelectasis is seen within the left lung base. A small left pleural effusion is also noted. Radiopaque surgical clips are seen overlying the medial aspect of the upper left lung. The right lung is clear. No pneumothorax is identified. Marked severity elevation of the left hemidiaphragm is seen. As a result, a large gastric air bubble is seen within the retrocardiac region. The heart size and mediastinal contours are within normal limits. Multiple chronic left-sided rib fractures are noted. IMPRESSION: 1. Marked severity elevation of the left hemidiaphragm with mild left basilar atelectasis. 2. Small left pleural effusion. 3. Postsurgical changes overlying the upper left lung. Electronically Signed   By: Virgina Norfolk M.D.   On: 03/22/2020 23:07        Scheduled Meds: . atorvastatin  80 mg Oral QHS  . clopidogrel  75 mg Oral Q breakfast  . potassium chloride  40 mEq Oral Daily  . rivaroxaban  20 mg Oral QPM  . vitamin B-12  1,000 mcg Oral QPM   Continuous Infusions: . sodium chloride 100 mL/hr at 03/23/20 1807  . cefTRIAXone (  ROCEPHIN)  IV Stopped (03/24/20 0239)  . vancomycin 750 mg (03/24/20 1128)     LOS: 0 days    Time spent: 40 minutes    Irine Seal, MD [facility]   To contact the attending provider between 7A-7P or the covering provider during after hours 7P-7A, please log into the web site [email or web address] and access using universal [facility] password for that web site. If you do not have the password, please call the hospital operator.  03/24/2020, 12:42 PM

## 2020-03-24 NOTE — Progress Notes (Signed)
  Echocardiogram 2D Echocardiogram has been performed.  Valerie [name] 03/24/2020, 1:27 PM

## 2020-03-24 NOTE — Telephone Encounter (Signed)
Hospitalized-Pt left a partially inaudible message that she is in the hospital room 1423, has MRSA .    I talked to her husband and her question is should she continue Tarceva ?   [name] said he feels strongly she should continue it and is bringing it to her today.

## 2020-03-24 NOTE — Progress Notes (Signed)
Occupational Therapy Evaluation  Patient requires extensive assistance at baseline from her spouse for self care tasks. Spouse uses a hoyer lift to transfer patient into wheelchair at home. Patient reports working with [facility] services until ~3 weeks ago. Patient also reports she and spouse were going to look at facilities for her "but I had to come to the hospital instead." Patient appears at baseline for self care, will discontinue acute OT services, please re-consult if new needs arise.     03/24/20 1400  OT Visit Information  Last OT Received On 03/24/20  Assistance Needed +2  PT/OT/SLP Co-Evaluation/Treatment Yes  Reason for Co-Treatment Complexity of the patient's impairments (multi-system involvement);For patient/therapist safety;To address functional/ADL transfers  OT goals addressed during session ADL's and self-care  History of Present Illness Valerie [name] is a 74 y.o. 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 who presents to ED 03/22/20 with concerns of altered mental status. Recently treated for UTI.  Precautions  Precautions Fall  Precaution Comments OOB via  lift PTA  Restrictions  Weight Bearing Restrictions No  Home Living  Family/patient expects to be discharged to: Private residence  Living Arrangements Spouse/significant other  Available Help at Discharge Family;Home health  Type of [place] entrance  [place] to live on main level with bedroom/bathroom  Bathroom Shower/Tub Walk-in Patent examiner - 4 wheels;Grab bars - tub/shower;BSC;Walker - 2 wheels;Wheelchair - manual;Hospital bed  Additional Comments mechanical lift  Prior Function  Level of Independence Needs  assistance  Gait / Transfers Assistance Needed nonambulatory - states PT stopped 3 weeks ago.Uses depends for B/B, does not get on toilet,  ADL's / [place] assists with ADLs  Communication  Communication No difficulties  Pain Assessment  Pain Assessment Faces  Faces Pain Scale 4  Pain Location buttocks- noted large dark area covering left buttock and proximal to mid thorax, Also on right from sacrum to mid thorax.  Pain Descriptors / Indicators Discomfort  Pain Intervention(s) Monitored during session  Cognition  Arousal/Alertness Awake/alert  Behavior During Therapy WFL for tasks assessed/performed  Overall Cognitive Status No family/caregiver present to determine baseline cognitive functioning  General Comments patient follows 1 step directions appropriately  Upper Extremity Assessment  Upper Extremity Assessment Generalized weakness  Lower Extremity Assessment  Lower Extremity Assessment Defer to PT evaluation  ADL  Overall ADL's  At baseline  General ADL Comments patient requires extensive assistance from her spouse for self care at baseline, goes to bathroom in briefs and spouse changes when soiled  Bed Mobility  Overal bed mobility Needs Assistance  Bed Mobility Rolling  Rolling Min guard  General bed mobility comments uses rail, turns self to each side  Transfers  General transfer comment NT- pt has need for BM. uses hoyer at baseline  Balance  Overall balance assessment  (not tested)  OT - End of Session  Activity Tolerance Patient tolerated treatment well  Patient left in bed;with call bell/phone within reach;with bed alarm set  Nurse Communication Mobility status  OT Assessment  OT Recommendation/Assessment Patient does not need any further OT services  OT Visit Diagnosis Pain  Pain - part of body  (buttock)  OT Problem List Pain  AM-PAC OT "6 Clicks" Daily Activity Outcome Measure (  Version 2)  Help from another person eating meals? 3   Help from another person taking care of personal grooming? 3  Help from another person toileting, which includes using toliet, bedpan, or urinal? 1  Help from another person bathing (including washing, rinsing, drying)? 2  Help from another person to put on and taking off regular upper body clothing? 2  Help from another person to put on and taking off regular lower body clothing? 1  6 Click Score 12  OT Recommendation  Follow Up Recommendations Supervision/Assistance - 24 hour;Other (comment) (pt comments plan to transition to higher level of care )  OT Equipment None recommended by OT  Acute Rehab OT Goals  Patient Stated Goal "I was going to look at a facility but had to come here"  OT Goal Formulation With patient  Time For Goal Achievement 03/24/20  OT Time Calculation  OT Start Time (ACUTE ONLY) 0903  OT Stop Time (ACUTE ONLY) 0926  OT Time Calculation (min) 23 min  OT General Charges  $OT Visit 1 Visit  OT Evaluation  $OT Eval Low Complexity 1 Low  Written Expression  Dominant Hand Right   Delbert Phenix OT OT pager: [phone]

## 2020-03-24 NOTE — Consult Note (Signed)
[facility] for Infectious Disease    Date of Admission:  03/22/2020           Day 2 vancomycin        Day 2 ceftriaxone       Reason for Consult: MRSA bacteremia    Referring Provider: Dr. Irine Seal  Assessment: She has MRSA bacteremia.  Source is uncertain but could be from one of the open areas on her breasts.  She is improving on vancomycin.  If she did have a symptomatic UTI.  To have resolved.  I will stop ceftriaxone now.  Repeat blood cultures and TTE are pending.  I spoke to her husband, Dr. Amadeo Garnet, by phone and updated him on the situation.  Plan: 1. Continue vancomycin 2. Discontinue ceftriaxone 3. Await results of repeat blood cultures and TTE  Principal Problem:   MRSA bacteremia Active Problems:   AMS (altered mental status)   Primary cancer of right upper lobe of lung (HCC)   Malignant neoplasm of brain ([place])   History of DVT of lower extremity   Congestive heart failure (CHF) (HCC)   Elevated troponin   Hypokalemia   Prolonged QT interval   DNR (do not resuscitate)   Normocytic anemia   Scheduled Meds: . atorvastatin  80 mg Oral QHS  . clopidogrel  75 mg Oral Q breakfast  . potassium chloride  40 mEq Oral Daily  . rivaroxaban  20 mg Oral QPM  . vitamin B-12  1,000 mcg Oral QPM   Continuous Infusions: . sodium chloride 100 mL/hr at 03/23/20 1807  . cefTRIAXone (ROCEPHIN)  IV Stopped (03/24/20 0239)  . vancomycin Stopped (03/24/20 0413)   PRN Meds:.polyethylene glycol  HPI: Valerie [name] is a 74 y.o. female with a history of metastatic lung cancer, DVT and CHF.  She recently developed some dysuria and was started on ciprofloxacin for recurrent UTI.  She developed sudden decrease in mental status leading to admission 2 days ago.  She was afebrile.  Urinalysis showed 0-5 white blood cells.  She was started on ceftriaxone.  1 of 1 admission blood cultures turned positive for MRSA yesterday.  She was started on vancomycin.  Her urine  culture is negative.   Review of Systems: Review of Systems  Constitutional: Positive for malaise/fatigue. Negative for chills, diaphoresis and fever.  HENT: Negative for congestion and sore throat.   Respiratory: Positive for cough. Negative for sputum production and shortness of breath.        No change in her chronic cough.  Cardiovascular: Negative for chest pain.  Gastrointestinal: Positive for nausea. Negative for abdominal pain, diarrhea and vomiting.  Genitourinary: Negative for dysuria.  Musculoskeletal: Positive for joint pain. Negative for back pain.  Skin: Positive for itching and rash.  Neurological: Negative for headaches.    Past Medical History:  Diagnosis Date  . Abnormal Pap smear 2006  . Anal fistula   . Ankle fracture   . Anxiety   . Arthritis   . Atrophic vaginitis 2008  . Cataract   . Dementia ([place]) 2009  . Dyspareunia 2008  . H/O osteoporosis   . H/O varicella   . H/O vitamin D deficiency   . Headache 07/24/2016  . Heart murmur   . History of measles, mumps, or rubella   . History of radiation therapy 07/28/13- 08/10/13   right lung metastasis 5000 cGy 10 sessions  . Hypertension   . Lung cancer ([place]) dx'd 2002  .  Lung cancer ([place])   . Lung cancer ([place])   . Lung metastasis ([place])    PET scan 05/05/13, RUL lung nodule  . 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 Nodule of right lung CT- 06/03/12   RIGHT UPPER LOBE  . Nodule of right lung 06/03/12   Upper Lobe  . On antineoplastic chemotherapy    TARCEVA  . Osteoporosis 2010  . Primary cancer of right upper lobe of lung ([place]) 04/22/2009   Qualifier: Diagnosis of  By: Nils Pyle CMA ([place]), Mearl Latin    . Shortness of breath    hx lung ca   . Status post chemotherapy 2003   CARBOPLATIN/PACLITAXEL /STATUS POST CLINICAL TRAIL OF CELEBREX AND IRESSA AT [facility] FOR 1 YEAR  . Status post radiation therapy 2003   LEFT LUNG  . Status post radiation therapy  11/07/2005   WHOLE BRAIN: DR Larkin [facility]  . Status post radiation therapy 06/02/2008   GAMMA KNIFE OF RESECTED CAVITAY  . Thyroid adenoma    ?  [name] Thyroid cancer ([place]) 10/18/11 bx   adenoid nodules   . Yeast infection     Social History   Tobacco Use  . Smoking status: Never Smoker  . Smokeless tobacco: Never Used  . Tobacco comment: smoked few years in college  Vaping Use  . Vaping Use: Never used  Substance Use Topics  . Alcohol use: No  . Drug use: No    Family History  Problem Relation Age of Onset  . Gait disorder Mother   . Cancer Mother        Meningioma  . Cancer Father        Pancreatic  . Heart failure Sister   . Cancer Maternal Aunt        menigeoma   Allergies  Allergen Reactions  . Keflex [Cephalexin] Itching and Rash    Throat swelling- Patient has tolerated Rocephin & Cefdinir since this reaction    OBJECTIVE: Blood pressure 138/69, pulse 93, temperature 98.2 F (36.8 C), temperature source Oral, resp. rate 19, height 5\' 7"  (1.702 m), weight 69.8 kg, SpO2 100 %.  Physical Exam Constitutional:      Comments: She is alert and pleasant.  HENT:     Mouth/Throat:     Mouth: Mucous membranes are moist.     Pharynx: Oropharynx is clear.  Cardiovascular:     Rate and Rhythm: Normal rate and regular rhythm.     Heart sounds: No murmur heard.   Pulmonary:     Effort: Pulmonary effort is normal.     Breath sounds: Normal breath sounds.  Chest:    Abdominal:     General: There is no distension.     Palpations: Abdomen is soft.  Musculoskeletal:     Cervical back: Neck supple.  Skin:    General: Skin is dry.     Comments: She has very dry slightly hyperpigmented skin on her arms where it itches.  Neurological:     General: No focal deficit present.  Psychiatric:        Mood and Affect: Mood normal.     Lab Results Lab Results  Component Value Date   WBC 6.4 03/24/2020   HGB 10.4 (L) 03/24/2020   HCT 34.2 (L) 03/24/2020   MCV 94.0  03/24/2020   PLT 253 03/24/2020    Lab Results  Component Value Date   CREATININE 0.74 03/24/2020   BUN 10 03/24/2020  NA 136 03/24/2020   K 3.6 03/24/2020   CL 107 03/24/2020   CO2 23 03/24/2020    Lab Results  Component Value Date   ALT 22 03/22/2020   AST 29 03/22/2020   ALKPHOS 82 03/22/2020   BILITOT 1.2 03/22/2020     Microbiology: Recent Results (from the past 240 hour(s))  SARS Coronavirus 2 by RT PCR (hospital order, performed in [facility] hospital lab) Nasopharyngeal Nasopharyngeal Swab     Status: None   Collection Time: 03/22/20 10:57 PM   Specimen: Nasopharyngeal Swab  Result Value Ref Range Status   SARS Coronavirus 2 NEGATIVE NEGATIVE Final    Comment: (NOTE) SARS-CoV-2 target nucleic acids are NOT DETECTED.  The SARS-CoV-2 RNA is generally detectable in upper and lower respiratory specimens during the acute phase of infection. The lowest concentration of SARS-CoV-2 viral copies this assay can detect is 250 copies / mL. A negative result does not preclude SARS-CoV-2 infection and should not be used as the sole basis for treatment or other patient management decisions.  A negative result may occur with improper specimen collection / handling, submission of specimen other than nasopharyngeal swab, presence of viral mutation(s) within the areas targeted by this assay, and inadequate number of viral copies (<250 copies / mL). A negative result must be combined with clinical observations, patient history, and epidemiological information.  Fact Sheet for Patients:   StrictlyIdeas.no  Fact Sheet for Healthcare Providers: BankingDealers.co.za  This test is not yet approved or  cleared by the [place] FDA and has been authorized for detection and/or diagnosis of SARS-CoV-2 by FDA under an Emergency Use Authorization (EUA).  This EUA will remain in effect (meaning this test can be used) for the duration of  the COVID-19 declaration under Section 564(b)(1) of the Act, 21 U.S.C. section 360bbb-3(b)(1), unless the authorization is terminated or revoked sooner.  Performed at [facility], [place] [address] [place], [place] [address]   Blood culture (routine single)     Status: Abnormal (Preliminary result)   Collection Time: 03/22/20 10:58 PM   Specimen: BLOOD  Result Value Ref Range Status   Specimen Description   Final    BLOOD LEFT WRIST Performed at [facility] [address], [place], [place] [address]    Special Requests   Final    BOTTLES DRAWN AEROBIC AND ANAEROBIC Blood Culture adequate volume Performed at [facility] [address], [place], [place] [address]    Culture  Setup Time   Final    IN BOTH AEROBIC AND ANAEROBIC BOTTLES GRAM POSITIVE COCCI Organism ID to follow CRITICAL RESULT CALLED TO, READ BACK BY AND VERIFIED WITH: Cristopher Estimable [facility] 03/23/20 2009 JDW    Culture (A)  Final    STAPHYLOCOCCUS AUREUS SUSCEPTIBILITIES TO FOLLOW Performed at [facility], [place] [address], [place], [place] [address]    Report Status PENDING  Incomplete  Blood Culture ID Panel (Reflexed)     Status: Abnormal   Collection Time: 03/22/20 10:58 PM  Result Value Ref Range Status   Enterococcus faecalis NOT DETECTED NOT DETECTED Final   Enterococcus Faecium NOT DETECTED NOT DETECTED Final   Listeria monocytogenes NOT DETECTED NOT DETECTED Final   Staphylococcus species DETECTED (A) NOT DETECTED Final    Comment: CRITICAL RESULT CALLED TO, READ BACK BY AND VERIFIED WITH: E WILLIAMSON PHARMD 03/23/20 2009 JDW    Staphylococcus aureus (BCID) DETECTED (A) NOT DETECTED Final    Comment: Methicillin (  oxacillin)-resistant Staphylococcus aureus (MRSA). MRSA is predictably resistant to beta-lactam antibiotics (except ceftaroline). Preferred therapy is vancomycin unless clinically contraindicated. Patient requires contact precautions if   hospitalized. CRITICAL RESULT CALLED TO, READ BACK BY AND VERIFIED WITH: E WILLIAMSON PHARMD 03/23/20 2009 JDW    Staphylococcus epidermidis NOT DETECTED NOT DETECTED Final   Staphylococcus lugdunensis NOT DETECTED NOT DETECTED Final   Streptococcus species NOT DETECTED NOT DETECTED Final   Streptococcus agalactiae NOT DETECTED NOT DETECTED Final   Streptococcus pneumoniae NOT DETECTED NOT DETECTED Final   Streptococcus pyogenes NOT DETECTED NOT DETECTED Final   A.calcoaceticus-baumannii NOT DETECTED NOT DETECTED Final   Bacteroides fragilis NOT DETECTED NOT DETECTED Final   Enterobacterales NOT DETECTED NOT DETECTED Final   Enterobacter cloacae complex NOT DETECTED NOT DETECTED Final   Escherichia coli NOT DETECTED NOT DETECTED Final   Klebsiella aerogenes NOT DETECTED NOT DETECTED Final   Klebsiella oxytoca NOT DETECTED NOT DETECTED Final   Klebsiella pneumoniae NOT DETECTED NOT DETECTED Final   Proteus species NOT DETECTED NOT DETECTED Final   Salmonella species NOT DETECTED NOT DETECTED Final   Serratia marcescens NOT DETECTED NOT DETECTED Final   Haemophilus influenzae NOT DETECTED NOT DETECTED Final   Neisseria meningitidis NOT DETECTED NOT DETECTED Final   Pseudomonas aeruginosa NOT DETECTED NOT DETECTED Final   Stenotrophomonas maltophilia NOT DETECTED NOT DETECTED Final   Candida albicans NOT DETECTED NOT DETECTED Final   Candida auris NOT DETECTED NOT DETECTED Final   Candida glabrata NOT DETECTED NOT DETECTED Final   Candida krusei NOT DETECTED NOT DETECTED Final   Candida parapsilosis NOT DETECTED NOT DETECTED Final   Candida tropicalis NOT DETECTED NOT DETECTED Final   Cryptococcus neoformans/gattii NOT DETECTED NOT DETECTED Final   Meth resistant mecA/C and MREJ DETECTED (A) NOT DETECTED Final    Comment: CRITICAL RESULT CALLED TO, READ BACK BY AND VERIFIED WITHCristopher Estimable [facility] 03/23/20 2009 JDW Performed at [facility], [address], [place], [place]  [address]   Urine culture     Status: Abnormal   Collection Time: 03/23/20  2:22 AM   Specimen: In/Out Cath Urine  Result Value Ref Range Status   Specimen Description   Final    IN/OUT CATH URINE Performed at [facility] [address] [place], [place] [address]    Special Requests   Final    NONE Performed at [facility], [place] [address], [place], [place] [address]    Culture (A)  Final    <10,000 COLONIES/mL INSIGNIFICANT GROWTH Performed at [facility] [address], [place], [place] [address]    Report Status 03/23/2020 FINAL  Final  Culture, blood (Routine X 2) w Reflex to ID Panel     Status: None (Preliminary result)   Collection Time: 03/23/20 11:14 PM   Specimen: BLOOD RIGHT HAND  Result Value Ref Range Status   Specimen Description   Final    BLOOD RIGHT HAND Performed at [facility], [place] [address], [place], [place] [address]    Special Requests   Final    BOTTLES DRAWN AEROBIC ONLY Blood Culture adequate volume Performed at [facility] [address], [place], [place] [address]    Culture PENDING  Incomplete   Report Status PENDING  Incomplete  Culture, blood (Routine X 2) w Reflex to ID Panel     Status: None (Preliminary result)   Collection Time: 03/23/20 11:14 PM   Specimen: BLOOD RIGHT  WRIST  Result Value Ref Range Status   Specimen Description   Final    BLOOD RIGHT WRIST Performed at [facility] [address], [place], [place] [address]    Special Requests   Final    BOTTLES DRAWN AEROBIC ONLY Blood Culture adequate volume Performed at [facility] [address], [place], [place] [address]    Culture PENDING  Incomplete   Report Status PENDING  Incomplete    Michel Bickers, MD [facility] for Infectious [facility] [phone] pager   [phone] cell 03/24/2020, 10:45 AM

## 2020-03-24 NOTE — Evaluation (Signed)
Physical Therapy Evaluation Patient Details Name: Valerie [name] MRN: [id number] DOB: 1946-06-20 Today's Date: 03/24/2020   History of Present Illness  Valerie [name] is a 74 y.o. 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 who presents to ED 03/22/20 with concerns of altered mental status. Recently treated for UTI. H/O dementia.  Clinical Impression  The patient  Participated in bed mobility for rolling to change linens and wash up. Patient reports that she and spouse were to visit a " facility" for the patient to go to.Patient not sure what facility and what level. PTA patient required mechanical lift  For getting into WC, assisted by the spouse.  Patient noted with darkened red area on back and left buttock. Patient reports that is has been present.   Pt admitted with above diagnosis. Pt currently with functional limitations due to the deficits listed below (see PT Problem List). Pt will benefit from skilled PT to increase their independence and safety with mobility to allow discharge to the venue listed below.       Follow Up Recommendations Supervision/Assistance - 24 hour (unsure, from home with 24/7 caregivers.    Equipment Recommendations  None recommended by PT    Recommendations for Other Services       Precautions / Restrictions Precautions Precautions: Fall Precaution Comments: OOB via  lift PTA      Mobility  Bed Mobility Overal bed mobility: Needs Assistance Bed Mobility: Rolling Rolling: Min guard         General bed mobility comments: uses rail, turns self to each side  Transfers                 General transfer comment: NT- pt has need for BM.  Ambulation/Gait                Stairs            Wheelchair  Mobility    Modified Rankin (Stroke Patients Only)       Balance                                             Pertinent Vitals/Pain Pain Assessment: Faces Faces Pain Scale: Hurts little more Pain Location: buttocks- noted large dark area covering left buttock and proximal to mid thorax, Also on right from sacrum to mid thorax. Pain Descriptors / Indicators: Discomfort Pain Intervention(s): Monitored during session;Premedicated before session;Repositioned    Home Living Family/patient expects to be discharged to:: Private residence Living Arrangements: Spouse/significant other Available Help at Discharge: Family;Home health Type of Home: House Home Access: Ramped entrance     Home Layout: Able to live on main level with bedroom/bathroom Home Equipment: Walker - 4 wheels;Grab bars - tub/shower;Bedside commode;Walker - 2 wheels;Wheelchair - manual;Hospital bed Additional Comments: mechanical lift    Prior Function Level of Independence: Needs assistance   Gait / Transfers Assistance Needed: nonambulatory - states PT stopped 3 weeks ago.Uses depends for B/B, does not get on toilet,  ADL's / Homemaking Assistance Needed: Spouse        Hand Dominance        Extremity/Trunk Assessment        Lower Extremity Assessment Lower Extremity Assessment:  Generalized weakness;RLE deficits/detail;LLE deficits/detail RLE Deficits / Details: able to flex the leg when supine LLE Deficits / Details: felses leg when supine       Communication   Communication: No difficulties  Cognition Arousal/Alertness: Awake/alert Behavior During Therapy: WFL for tasks assessed/performed Overall Cognitive Status: no family present                                       General Comments General comments (skin integrity, edema, etc.): NT    Exercises     Assessment/Plan    PT Assessment Patient needs continued PT services  PT Problem List Decreased  strength;Decreased mobility;Decreased range of motion;Decreased activity tolerance;Decreased balance;Pain;Decreased skin integrity       PT Treatment Interventions Therapeutic activities;Functional mobility training;Patient/family education    PT Goals (Current goals can be found in the Care Plan section)  Acute Rehab PT Goals Patient Stated Goal: I was to go look at a facility , I want to be able to stnad. PT Goal Formulation: With patient Time For Goal Achievement: 04/07/20 Potential to Achieve Goals: Fair    Frequency Min 2X/week   Barriers to discharge        Co-evaluation               AM-PAC PT "6 Clicks" Mobility  Outcome Measure Help needed turning from your back to your side while in a flat bed without using bedrails?: A Little Help needed moving from lying on your back to sitting on the side of a flat bed without using bedrails?: A Little Help needed moving to and from a bed to a chair (including a wheelchair)?: Total Help needed standing up from a chair using your arms (e.g., wheelchair or bedside chair)?: Total Help needed to walk in hospital room?: Total Help needed climbing 3-5 steps with a railing? : Total 6 Click Score: 10    End of Session   Activity Tolerance: Patient limited by fatigue Patient left: in bed;with call bell/phone within reach;with bed alarm set Nurse Communication: Mobility status;Need for lift equipment PT Visit Diagnosis: Muscle weakness (generalized) (M62.81)    Time: 5329-9242 PT Time Calculation (min) (ACUTE ONLY): 32 min   Charges:   PT Evaluation $PT Eval Low Complexity: [place] [facility] PT Acute Rehabilitation Services Pager [phone] Office [phone]   Claretha Cooper 03/24/2020, 12:27 PM

## 2020-03-24 NOTE — Telephone Encounter (Signed)
Per Valerie [name], I told Valerie [name] that Valerie [name] should continue Tarceva.

## 2020-03-25 LAB — CBC WITH DIFFERENTIAL/PLATELET
Abs Immature Granulocytes: 0.02 10*3/uL (ref 0.00–0.07)
Basophils Absolute: 0 10*3/uL (ref 0.0–0.1)
Basophils Relative: 0 %
Eosinophils Absolute: 0.6 10*3/uL — ABNORMAL HIGH (ref 0.0–0.5)
Eosinophils Relative: 11 %
HCT: 32.1 % — ABNORMAL LOW (ref 36.0–46.0)
Hemoglobin: 9.9 g/dL — ABNORMAL LOW (ref 12.0–15.0)
Immature Granulocytes: 0 %
Lymphocytes Relative: 19 %
Lymphs Abs: 1 10*3/uL (ref 0.7–4.0)
MCH: 28.9 pg (ref 26.0–34.0)
MCHC: 30.8 g/dL (ref 30.0–36.0)
MCV: 93.9 fL (ref 80.0–100.0)
Monocytes Absolute: 0.4 10*3/uL (ref 0.1–1.0)
Monocytes Relative: 7 %
Neutro Abs: 3.5 10*3/uL (ref 1.7–7.7)
Neutrophils Relative %: 63 %
Platelets: 269 10*3/uL (ref 150–400)
RBC: 3.42 MIL/uL — ABNORMAL LOW (ref 3.87–5.11)
RDW: 15.8 % — ABNORMAL HIGH (ref 11.5–15.5)
WBC: 5.5 10*3/uL (ref 4.0–10.5)
nRBC: 0 % (ref 0.0–0.2)

## 2020-03-25 LAB — BASIC METABOLIC PANEL
Anion gap: 8 (ref 5–15)
BUN: 8 mg/dL (ref 8–23)
CO2: 22 mmol/L (ref 22–32)
Calcium: 8.8 mg/dL — ABNORMAL LOW (ref 8.9–10.3)
Chloride: 109 mmol/L (ref 98–111)
Creatinine, Ser: 0.78 mg/dL (ref 0.44–1.00)
GFR calc Af Amer: >60 mL/min (ref >60)
GFR calc non Af Amer: >60 mL/min (ref >60)
Glucose, Bld: 90 mg/dL (ref 70–99)
Potassium: 3.5 mmol/L (ref 3.5–5.1)
Sodium: 139 mmol/L (ref 135–145)

## 2020-03-25 LAB — CULTURE, BLOOD (SINGLE): Special Requests: ADEQUATE

## 2020-03-25 LAB — MAGNESIUM: Magnesium: 1.8 mg/dL (ref 1.7–2.4)

## 2020-03-25 MED ORDER — MAGNESIUM SULFATE 4 GM/100ML IV SOLN
4.0000 g | Freq: Once | INTRAVENOUS | Status: AC
  Administered 2020-03-25: 4 g via INTRAVENOUS
  Filled 2020-03-25 (×2): qty 100

## 2020-03-25 NOTE — Progress Notes (Signed)
    [facility] has been requested to perform a transesophageal echocardiogram on 03/28/20 for bacteremia.  After careful review of history and examination, the risks and benefits of transesophageal echocardiogram have been explained including risks of esophageal damage, perforation (1:10,000 risk), bleeding, pharyngeal hematoma as well as other potential complications associated with conscious sedation including aspiration, arrhythmia, respiratory failure and death. Alternatives to treatment were discussed, questions were answered. Patient is willing to proceed.   TEE scheduled for 03/28/20 at 11:30am with Dr. Harrell Gave.  Roby Lofts, PA-C 03/25/2020 1:36 PM

## 2020-03-25 NOTE — Progress Notes (Signed)
[facility] called for transport on Monday, August 9th, 2021 for TEE with Dr. Harrell Gave, informed pt needs to be at [facility] at 1000, verbal acknowledgement noted, Horton Marshall RN, notified of procedure and [facility] transportation set-up for Aug 9th, verbal acknowledgement noted

## 2020-03-25 NOTE — Progress Notes (Signed)
PROGRESS NOTE    Valerie [name]  MWU:[id number] DOB: 08-16-1946 DOA: 03/22/2020 PCP: [name], MD    Chief Complaint  Patient presents with  . Altered Mental Status  . Fatigue    Brief Narrative:  [name] is a 74 y.o. 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  History is obtained from husband phone and daughter at bedside.  Patient was unaware of events surrounding her altered mental status.  Reportedly patient was talking on the phone with her sister today when she fell asleep and dropped her phone.  They later realized she was less responsive and decided to bring her to the ED.  She did receive Benadryl about an hour prior for some generalized itching.  She was diagnosed with UTI with symptoms of dysuria about 3 days ago and has been taking Cipro.  Otherwise no fever.  No nausea, vomiting or worsening diarrhea.Pt continues to have dysuria and notes some lower abdominal pain today.  In the ED, she was afebrile, had mild leukocytosis of 10.8, stable mild normocytic anemia with hemoglobin of 11.4.  EKG had prolonged QTC of 570 and new inverted T waves in V1 to V2 compared to prior on my review.  ED PA discussed these findings with cardiology fellow Dr. Kalman Shan who was concerned about possible elevated intracranial pressure and CT head was obtained and normal.  Differential diagnosis remains broad per cardiology but he has high threshold for any further ischemic evaluation.   Troponin of 67.  Magnesium of 2.  Potassium of 2.8.  Assessment & Plan:   Principal Problem:   MRSA bacteremia Active Problems:   Primary cancer of right upper lobe of lung ([place])   Malignant neoplasm of brain ([place])   History of DVT of lower  extremity   Congestive heart failure (CHF) (HCC)   Elevated troponin   Dilated cardiomyopathy (H[facility])   AMS (altered mental status)   Hypokalemia   Prolonged QT interval   DNR (do not resuscitate)   Normocytic anemia   Urinary tract infection without hematuria  1 MRSA bacteremia Questionable etiology.  Patient admitted with altered mental status/acute encephalopathy felt initially secondary to UTI as patient with history of brain mets.  Patient pancultured and 1 set of blood cultures initially drawn on admission was positive for MRSA.  Repeat blood cultures x2 obtained prior to initiation of IV vancomycin.  2D echo with EF of 25 to 30%, global hypokinesis, grade 1 diastolic dysfunction, no vegetations seen. Unchanged from prior 2D echo. ID recommending TEE.   IV Rocephin has been discontinued. Continue IV vancomycin. ID following and I appreciate the input and recommendations.   2.  Acute metabolic encephalopathy Likely secondary to problem #1 and probable UTI in the setting of brain mets and possibly secondary to fluoroquinolone.  Urine cultures with insignificant growth.  Clinical improvement.  Continue IV vancomycin.  IV Rocephin has been discontinued.  Supportive care.  3.  Hypokalemia .  Potassium at 3.5.  Continue oral daily potassium supplementation. Follow.  4.  Prolonged QT Repeat EKG with QTC prolongation.  Hold any QT prolonging medications.  Beta-blocker on hold.  Keep potassium > 4.  Keep magnesium > 2.  Repeat EKG in the morning.  5.  History of DVT Continue Xarelto.  6.  Elevated  troponin Likely secondary to a demand ischemia.  Cardiology, Dr. Kalman Shan evaluated EKG changes and feels low suspicion for ischemic disease.  Patient asymptomatic.  Repeat troponins trended down.  2D echo with EF of 25 to 30%, global hypokinesis, grade 1 diastolic dysfunction, no vegetations seen. Unchanged from prior 2D echo.Follow.  7.  Chronic combined CHF/dilated cardiomyopathy Currently stable.   Euvolemic.  8.  History of non-small cell lung cancer with mets to the brain and neck CT head negative for any acute findings.  Patient on oral chemotherapy.  Patient currently with MRSA bacteremia and ideally would like to keep patient off Tarceva.  Per RN family insistent and looks like family have contacted the cancer center and per notes in chart Dr. Earlie Server recommending patient be continued on Tarceva. Case discussed with Dr. Earlie Server who stated that if patient improves clinically could likely resume Tarceva in the next 1 to 2 days as it inhibitor of tyrosine kinase of EGFR and patient should do fine with it in spite of ongoing infection. Oncology assessed the patient this morning. Follow.   9. Bacteriuria Patient prior to admission was being treated for UTI with ciprofloxacin.  Urinalysis done on admission nitrite positive negative leukocytes 0-5 WBCs.  Urine cultures with insignificant growth.  Patient was on IV Rocephin which has subsequently been discontinued.   DVT prophylaxis: Xarelto Code Status: DNR Family Communication: Updated patient Disposition:   Status is: Inpatient    Dispo: The patient is from: Home              Anticipated d/c is to: Likely home.  To be determined.              Anticipated d/c date is: To be determined              Patient currently with MRSA bacteremia, on IV antibiotics, history of metastatic lung cancer on chemotherapy. Needs TEE which will be done on Monday, 03/28/2020. Not stable for discharge.       Consultants:   ID: Dr. Megan Salon 03/24/2020  Procedures:   2D echo 03/24/2020  CT head 03/22/2020  Chest x-ray 11/07/2019  Antimicrobials:   IV vancomycin 03/23/2020  IV Rocephin 03/23/2020>>>> 03/24/2020   Subjective: Sitting up in bed. Eating. Denies any chest pain or shortness of breath. States she is actually feeling better. Resistant to the idea of a PICC line. States she saw her oncologist this morning.   Objective: Vitals:   03/24/20  1439 03/24/20 2019 03/24/20 2035 03/25/20 0638  BP: 124/76  (!) 126/56 127/66  Pulse: 92  89 81  Resp: _0 Temp: (!) 97.5 F (36.4 C)  98.3 F (36.8 C) 98.2 F (36.8 C)  TempSrc: Oral   Oral  SpO2: 100%  99% 97%  Weight:      Height:        Intake/Output Summary (Last 24 hours) at 03/25/2020 1250 Last data filed at 03/25/2020 1041 Gross per 24 hour  Intake 970 ml  Output 700 ml  Net 270 ml   Filed Weights   03/22/20 2248 03/23/20 2230  Weight: 73.9 kg 69.8 kg    Examination:  General exam: NAD. Respiratory system: CTAB. No wheezes, no crackles, no rhonchi. Normal respiratory effort.  Cardiovascular system: Regular rate and rhythm no murmurs rubs or gallops. No JVD no murmurs rubs or gallops. No lower extremity edema. Gastrointestinal system: Abdomen is soft, nontender, nondistended, positive bowel sounds. No rebound. No guarding. Central nervous system: Alert and  oriented. No focal neurological deficits. Extremities: Symmetric 5 x 5 power. Skin: Band-Aid noted around left breast where patient stated had a mole removed. Psychiatry: Judgement and insight appear normal. Mood & affect appropriate.     Data Reviewed: I have personally reviewed following labs and imaging studies  CBC: Recent Labs  Lab 03/22/20 2257 03/23/20 0446 03/24/20 0431 03/25/20 0425  WBC 10.8* 8.2 6.4 5.5  NEUTROABS 9.4*  --  4.3 3.5  HGB 11.4* 9.5* 10.4* 9.9*  HCT 36.8 30.8* 34.2* 32.1*  MCV 94.8 94.8 94.0 93.9  PLT 275 251 253 413    Basic Metabolic Panel: Recent Labs  Lab 03/22/20 2257 03/22/20 2329 03/23/20 0446 03/24/20 0431 03/25/20 0425  NA 140  --  138 136 139  K 2.8*  --  3.7 3.6 3.5  CL 106  --  106 107 109  CO2 26  --  _0 GLUCOSE 142*  --  144* 100* 90  BUN 13  --  _1 CREATININE 0.90  --  0.81 0.74 0.78  CALCIUM 9.0  --  8.5* 8.6* 8.8*  MG  --  2.0  --  1.9 1.8    GFR: Estimated Creatinine Clearance: 60 mL/min (by C-G formula based on SCr of  0.78 mg/dL).  Liver Function Tests: Recent Labs  Lab 03/22/20 2257  AST 29  ALT 22  ALKPHOS 82  BILITOT 1.2  PROT 6.0*  ALBUMIN 3.6    CBG: No results for input(s): GLUCAP in the last 168 hours.   Recent Results (from the past 240 hour(s))  SARS Coronavirus 2 by RT PCR (hospital order, performed in [facility] hospital lab) Nasopharyngeal Nasopharyngeal Swab     Status: None   Collection Time: 03/22/20 10:57 PM   Specimen: Nasopharyngeal Swab  Result Value Ref Range Status   SARS Coronavirus 2 NEGATIVE NEGATIVE Final    Comment: (NOTE) SARS-CoV-2 target nucleic acids are NOT DETECTED.  The SARS-CoV-2 RNA is generally detectable in upper and lower respiratory specimens during the acute phase of infection. The lowest concentration of SARS-CoV-2 viral copies this assay can detect is 250 copies / mL. A negative result does not preclude SARS-CoV-2 infection and should not be used as the sole basis for treatment or other patient management decisions.  A negative result may occur with improper specimen collection / handling, submission of specimen other than nasopharyngeal swab, presence of viral mutation(s) within the areas targeted by this assay, and inadequate number of viral copies (<250 copies / mL). A negative result must be combined with clinical observations, patient history, and epidemiological information.  Fact Sheet for Patients:   StrictlyIdeas.no  Fact Sheet for Healthcare Providers: BankingDealers.co.za  This test is not yet approved or  cleared by the [place] FDA and has been authorized for detection and/or diagnosis of SARS-CoV-2 by FDA under an Emergency Use Authorization (EUA).  This EUA will remain in effect (meaning this test can be used) for the duration of the COVID-19 declaration under Section 564(b)(1) of the Act, 21 U.S.C. section 360bbb-3(b)(1), unless the authorization is terminated or revoked  sooner.  Performed at [facility], [place] [address], [place], [place] [address]   Blood culture (routine single)     Status: Abnormal   Collection Time: 03/22/20 10:58 PM   Specimen: BLOOD  Result Value Ref Range Status   Specimen Description   Final    BLOOD LEFT WRIST Performed at [facility]  [address] [place], [place] [address]    Special Requests   Final    BOTTLES DRAWN AEROBIC AND ANAEROBIC Blood Culture adequate volume Performed at [facility] [address] [place], [place] [address]    Culture  Setup Time   Final    IN BOTH AEROBIC AND ANAEROBIC BOTTLES GRAM POSITIVE COCCI Organism ID to follow CRITICAL RESULT CALLED TO, READ BACK BY AND VERIFIED WITHCristopher Estimable [facility] 03/23/20 2009 JDW Performed at [facility], [place] [address], [place], [place] [address]    Culture METHICILLIN RESISTANT STAPHYLOCOCCUS AUREUS (A)  Final   Report Status 03/25/2020 FINAL  Final   Organism ID, Bacteria METHICILLIN RESISTANT STAPHYLOCOCCUS AUREUS  Final      Susceptibility   Methicillin resistant staphylococcus aureus - MIC*    CIPROFLOXACIN >=8 RESISTANT Resistant     ERYTHROMYCIN >=8 RESISTANT Resistant     GENTAMICIN <=0.5 SENSITIVE Sensitive     OXACILLIN >=4 RESISTANT Resistant     TETRACYCLINE <=1 SENSITIVE Sensitive     VANCOMYCIN 1 SENSITIVE Sensitive     TRIMETH/SULFA <=10 SENSITIVE Sensitive     CLINDAMYCIN >=8 RESISTANT Resistant     RIFAMPIN <=0.5 SENSITIVE Sensitive     Inducible Clindamycin NEGATIVE Sensitive     * METHICILLIN RESISTANT STAPHYLOCOCCUS AUREUS  Blood Culture ID Panel (Reflexed)     Status: Abnormal   Collection Time: 03/22/20 10:58 PM  Result Value Ref Range Status   Enterococcus faecalis NOT DETECTED NOT DETECTED Final   Enterococcus Faecium NOT DETECTED NOT DETECTED Final   Listeria monocytogenes NOT DETECTED NOT DETECTED Final   Staphylococcus species DETECTED (A) NOT DETECTED  Final    Comment: CRITICAL RESULT CALLED TO, READ BACK BY AND VERIFIED WITH: E WILLIAMSON PHARMD 03/23/20 2009 JDW    Staphylococcus aureus (BCID) DETECTED (A) NOT DETECTED Final    Comment: Methicillin (oxacillin)-resistant Staphylococcus aureus (MRSA). MRSA is predictably resistant to beta-lactam antibiotics (except ceftaroline). Preferred therapy is vancomycin unless clinically contraindicated. Patient requires contact precautions if  hospitalized. CRITICAL RESULT CALLED TO, READ BACK BY AND VERIFIED WITH: E WILLIAMSON PHARMD 03/23/20 2009 JDW    Staphylococcus epidermidis NOT DETECTED NOT DETECTED Final   Staphylococcus lugdunensis NOT DETECTED NOT DETECTED Final   Streptococcus species NOT DETECTED NOT DETECTED Final   Streptococcus agalactiae NOT DETECTED NOT DETECTED Final   Streptococcus pneumoniae NOT DETECTED NOT DETECTED Final   Streptococcus pyogenes NOT DETECTED NOT DETECTED Final   A.calcoaceticus-baumannii NOT DETECTED NOT DETECTED Final   Bacteroides fragilis NOT DETECTED NOT DETECTED Final   Enterobacterales NOT DETECTED NOT DETECTED Final   Enterobacter cloacae complex NOT DETECTED NOT DETECTED Final   Escherichia coli NOT DETECTED NOT DETECTED Final   Klebsiella aerogenes NOT DETECTED NOT DETECTED Final   Klebsiella oxytoca NOT DETECTED NOT DETECTED Final   Klebsiella pneumoniae NOT DETECTED NOT DETECTED Final   Proteus species NOT DETECTED NOT DETECTED Final   Salmonella species NOT DETECTED NOT DETECTED Final   Serratia marcescens NOT DETECTED NOT DETECTED Final   Haemophilus influenzae NOT DETECTED NOT DETECTED Final   Neisseria meningitidis NOT DETECTED NOT DETECTED Final   Pseudomonas aeruginosa NOT DETECTED NOT DETECTED Final   Stenotrophomonas maltophilia NOT DETECTED NOT DETECTED Final   Candida albicans NOT DETECTED NOT DETECTED Final   Candida auris NOT DETECTED NOT DETECTED Final   Candida glabrata NOT DETECTED NOT DETECTED Final   Candida krusei NOT  DETECTED NOT DETECTED Final   Candida parapsilosis NOT DETECTED NOT DETECTED  Final   Candida tropicalis NOT DETECTED NOT DETECTED Final   Cryptococcus neoformans/gattii NOT DETECTED NOT DETECTED Final   Meth resistant mecA/C and MREJ DETECTED (A) NOT DETECTED Final    Comment: CRITICAL RESULT CALLED TO, READ BACK BY AND VERIFIED WITHCristopher Estimable [facility] 03/23/20 2009 JDW Performed at [facility], [place] [address], [place], [place] [address]   Urine culture     Status: Abnormal   Collection Time: 03/23/20  2:22 AM   Specimen: In/Out Cath Urine  Result Value Ref Range Status   Specimen Description   Final    IN/OUT CATH URINE Performed at [facility] [address] [place], [place] [address]    Special Requests   Final    NONE Performed at [facility], [place] [address], [place], [place] [address]    Culture (A)  Final    <10,000 COLONIES/mL INSIGNIFICANT GROWTH Performed at [facility] [address], [place], [place] [address]    Report Status 03/23/2020 FINAL  Final  Culture, blood (Routine X 2) w Reflex to ID Panel     Status: None (Preliminary result)   Collection Time: 03/23/20 11:14 PM   Specimen: BLOOD RIGHT HAND  Result Value Ref Range Status   Specimen Description   Final    BLOOD RIGHT HAND Performed at [facility], [place] [address] [place], [place] [address]    Special Requests   Final    BOTTLES DRAWN AEROBIC ONLY Blood Culture adequate volume Performed at [facility] [address] [place], [place] [address]    Culture PENDING  Incomplete   Report Status PENDING  Incomplete  Culture, blood (Routine X 2) w Reflex to ID Panel     Status: None (Preliminary result)   Collection Time: 03/23/20 11:14 PM   Specimen: BLOOD RIGHT WRIST  Result Value Ref Range Status   Specimen Description   Final    BLOOD RIGHT WRIST Performed at [facility], [address] [place], [place]  [address]    Special Requests   Final    BOTTLES DRAWN AEROBIC ONLY Blood Culture adequate volume Performed at [facility] [address] [place], [place] [address]    Culture PENDING  Incomplete   Report Status PENDING  Incomplete         Radiology Studies: ECHOCARDIOGRAM COMPLETE  Result Date: 03/24/2020    ECHOCARDIOGRAM REPORT   Patient Name:   Valerie [name] Date of Exam: 03/24/2020 Medical Rec #:  [id number]           Height:       67.0 in Accession #:    [id number]          Weight:       153.8 lb Date of Birth:  May 29, 1946          BSA:          1.809 m Patient Age:    36 years            BP:           138/69 mmHg Patient Gender: F                   HR:           66 bpm. Exam Location:  Inpatient Procedure: 2D Echo, Cardiac Doppler and Color Doppler Indications:    Bacteremia 790.7 / R78.81  History:  Patient has prior history of Echocardiogram examinations, most                 recent 02/15/2020. Signs/Symptoms:Dyspnea and Murmur; Risk                 Factors:Hypertension. Cancer.  Sonographer:    Jonelle Sidle Dance Referring Phys: [place]  1. Left ventricular ejection fraction, by estimation, is 25 to 30%. The left ventricle has severely decreased function. The left ventricle demonstrates global hypokinesis. Left ventricular diastolic parameters are consistent with Grade I diastolic dysfunction (impaired relaxation).  2. Right ventricular systolic function is normal. The right ventricular size is normal. Tricuspid regurgitation signal is inadequate for assessing PA pressure.  3. Left atrial size was mildly dilated.  4. The mitral valve is normal in structure. Mild mitral valve regurgitation. No evidence of mitral stenosis.  5. The aortic valve is tricuspid. Aortic valve regurgitation is mild. Mild aortic valve sclerosis is present, with no evidence of aortic valve stenosis.  6. The inferior vena cava is normal in size with greater than 50%  respiratory variability, suggesting right atrial pressure of 3 mmHg. FINDINGS  Left Ventricle: Left ventricular ejection fraction, by estimation, is 25 to 30%. The left ventricle has severely decreased function. The left ventricle demonstrates global hypokinesis. The left ventricular internal cavity size was normal in size. There is no left ventricular hypertrophy. Left ventricular diastolic parameters are consistent with Grade I diastolic dysfunction (impaired relaxation). Right Ventricle: The right ventricular size is normal.Right ventricular systolic function is normal. Tricuspid regurgitation signal is inadequate for assessing PA pressure. The tricuspid regurgitant velocity is 2.51 m/s, and with an assumed right atrial pressure of 3 mmHg, the estimated right ventricular systolic pressure is 21.2 mmHg. Left Atrium: Left atrial size was mildly dilated. Right Atrium: Right atrial size was normal in size. Pericardium: A small pericardial effusion is present. Mitral Valve: The mitral valve is normal in structure. Normal mobility of the mitral valve leaflets. Mild mitral annular calcification. Mild mitral valve regurgitation. No evidence of mitral valve stenosis. Tricuspid Valve: The tricuspid valve is normal in structure. Tricuspid valve regurgitation is trivial. No evidence of tricuspid stenosis. Aortic Valve: The aortic valve is tricuspid. Aortic valve regurgitation is mild. Aortic regurgitation PHT measures 341 msec. Mild aortic valve sclerosis is present, with no evidence of aortic valve stenosis. Pulmonic Valve: The pulmonic valve was normal in structure. Pulmonic valve regurgitation is not visualized. No evidence of pulmonic stenosis. Aorta: The aortic root is normal in size and structure. Venous: The inferior vena cava is normal in size with greater than 50% respiratory variability, suggesting right atrial pressure of 3 mmHg.  LEFT VENTRICLE PLAX 2D LVIDd:         4.40 cm  Diastology LVIDs:         3.40 cm  LV  e' lateral:   4.79 cm/s LV PW:         0.90 cm  LV E/e' lateral: 14.4 LV IVS:        1.00 cm  LV e' medial:    3.92 cm/s LVOT diam:     1.80 cm  LV E/e' medial:  17.6 LV SV:         58 LV SV Index:   32 LVOT Area:     2.54 cm  RIGHT VENTRICLE             IVC RV Basal diam:  3.20 cm     IVC diam: 2.00  cm RV Mid diam:    1.70 cm RV S prime:     11.10 cm/s TAPSE (M-mode): 1.9 cm LEFT ATRIUM             Index       RIGHT ATRIUM           Index LA diam:        4.10 cm 2.27 cm/m  RA Area:     11.10 cm LA Vol (A2C):   86.7 ml 47.94 ml/m RA Volume:   28.70 ml  15.87 ml/m LA Vol (A4C):   61.5 ml 34.00 ml/m LA Biplane Vol: 77.0 ml 42.57 ml/m  AORTIC VALVE LVOT Vmax:   117.00 cm/s LVOT Vmean:  77.200 cm/s LVOT VTI:    0.229 m AI PHT:      341 msec  AORTA Ao Root diam: 3.50 cm Ao Asc diam:  3.10 cm MITRAL VALVE                TRICUSPID VALVE MV Area (PHT): 3.21 cm     TR Peak grad:   25.2 mmHg MV Decel Time: 236 msec     TR Vmax:        251.00 cm/s MV E velocity: 69.00 cm/s MV A velocity: 108.00 cm/s  SHUNTS MV E/A ratio:  0.64         Systemic VTI:  0.23 m                             Systemic Diam: 1.80 cm Kirk Ruths MD Electronically signed by Kirk Ruths MD Signature Date/Time: 03/24/2020/1:39:31 PM    Final         Scheduled Meds: . atorvastatin  80 mg Oral QHS  . clopidogrel  75 mg Oral Q breakfast  . potassium chloride  40 mEq Oral Daily  . rivaroxaban  20 mg Oral QPM  . vitamin B-12  1,000 mcg Oral QPM   Continuous Infusions: . vancomycin 750 mg (03/25/20 0155)     LOS: 1 day    Time spent: 40 minutes    Irine Seal, MD [facility]   To contact the attending provider between 7A-7P or the covering provider during after hours 7P-7A, please log into the web site [email or web address] and access using universal [place] password for that web site. If you do not have the password, please call the hospital operator.  03/25/2020, 12:50 PM

## 2020-03-25 NOTE — Progress Notes (Signed)
Patient ID: Valerie [name], female   DOB: November 26, 1945, 74 y.o.   MRN: [id number]         [facility]  Date of Admission:  03/22/2020           Day 3 vancomycin ASSESSMENT:  Admission blood cultures grew MRSA.  Repeat blood cultures done 36 hours ago before vancomycin was started remain negative before.  There was no evidence of endocarditis on TTE.  TEE.  She is very reluctant to have PICC placement for long-term IV vancomycin therapy.  I would consider completing therapy with oral linezolid, especially if repeat blood cultures remain negative and TEE does not show evidence of endocarditis.  PLAN: 1. Continue vancomycin 2. Recommend TEE 3. I asked her to discuss possible PICC placement with her husband 4. I will follow-up Monday, 03/28/2020  Principal Problem:   MRSA bacteremia Active Problems:   AMS (altered mental status)   Primary cancer of right upper lobe of lung ([place])   Malignant neoplasm of brain ([place])   History of DVT of lower extremity   Congestive heart failure (CHF) (HCC)   Elevated troponin   Dilated cardiomyopathy (HCC)   Hypokalemia   Prolonged QT interval   DNR (do not resuscitate)   Normocytic anemia   Urinary tract infection without hematuria   Scheduled Meds: . atorvastatin  80 mg Oral QHS  . clopidogrel  75 mg Oral Q breakfast  . potassium chloride  40 mEq Oral Daily  . rivaroxaban  20 mg Oral QPM  . vitamin B-12  1,000 mcg Oral QPM   Continuous Infusions: . magnesium sulfate bolus IVPB 4 g (03/25/20 0932)  . vancomycin 750 mg (03/25/20 0155)   PRN Meds:.LORazepam, polyethylene glycol   SUBJECTIVE: She is feeling better.  She tells me that she has her "head together" today.  Review of Systems: Review of Systems  Constitutional: Negative for chills, diaphoresis and fever.  Respiratory: Negative for cough and shortness of breath.   Cardiovascular: Negative for chest pain.  Gastrointestinal: Negative for abdominal  pain, diarrhea, nausea and vomiting.  Genitourinary: Negative for dysuria.    Allergies  Allergen Reactions  . Keflex [Cephalexin] Itching and Rash    Throat swelling- Patient has tolerated Rocephin & Cefdinir since this reaction    OBJECTIVE: Vitals:   03/24/20 1439 03/24/20 2019 03/24/20 2035 03/25/20 0638  BP: 124/76  (!) 126/56 127/66  Pulse: 92  89 81  Resp: 20 19 19 19   Temp: (!) 97.5 F (36.4 C)  98.3 F (36.8 C) 98.2 F (36.8 C)  TempSrc: Oral   Oral  SpO2: 100%  99% 97%  Weight:      Height:       Body mass index is 24.09 kg/m.  Physical Exam Constitutional:      Comments: She is very pleasant and in no distress.  Cardiovascular:     Rate and Rhythm: Normal rate and regular rhythm.     Heart sounds: No murmur heard.   Pulmonary:     Effort: Pulmonary effort is normal.     Breath sounds: Normal breath sounds.  Abdominal:     Palpations: Abdomen is soft.     Tenderness: There is no abdominal tenderness.  Musculoskeletal:        General: No swelling or tenderness.     Comments: She has a healed incision over her right knee.  Skin:    Findings: No rash.  Psychiatric:  Mood and Affect: Mood normal.     Lab Results Lab Results  Component Value Date   WBC 5.5 03/25/2020   HGB 9.9 (L) 03/25/2020   HCT 32.1 (L) 03/25/2020   MCV 93.9 03/25/2020   PLT 269 03/25/2020    Lab Results  Component Value Date   CREATININE 0.78 03/25/2020   BUN 8 03/25/2020   NA 139 03/25/2020   K 3.5 03/25/2020   CL 109 03/25/2020   CO2 22 03/25/2020    Lab Results  Component Value Date   ALT 22 03/22/2020   AST 29 03/22/2020   ALKPHOS 82 03/22/2020   BILITOT 1.2 03/22/2020     Microbiology: Recent Results (from the past 240 hour(s))  SARS Coronavirus 2 by RT PCR (hospital order, performed in [place] hospital lab) Nasopharyngeal Nasopharyngeal Swab     Status: None   Collection Time: 03/22/20 10:57 PM   Specimen: Nasopharyngeal Swab  Result Value Ref  Range Status   SARS Coronavirus 2 NEGATIVE NEGATIVE Final    Comment: (NOTE) SARS-CoV-2 target nucleic acids are NOT DETECTED.  The SARS-CoV-2 RNA is generally detectable in upper and lower respiratory specimens during the acute phase of infection. The lowest concentration of SARS-CoV-2 viral copies this assay can detect is 250 copies / mL. A negative result does not preclude SARS-CoV-2 infection and should not be used as the sole basis for treatment or other patient management decisions.  A negative result may occur with improper specimen collection / handling, submission of specimen other than nasopharyngeal swab, presence of viral mutation(s) within the areas targeted by this assay, and inadequate number of viral copies (<250 copies / mL). A negative result must be combined with clinical observations, patient history, and epidemiological information.  Fact Sheet for Patients:   StrictlyIdeas.no  Fact Sheet for Healthcare Providers: BankingDealers.co.za  This test is not yet approved or  cleared by the [place] FDA and has been authorized for detection and/or diagnosis of SARS-CoV-2 by FDA under an Emergency Use Authorization (EUA).  This EUA will remain in effect (meaning this test can be used) for the duration of the COVID-19 declaration under Section 564(b)(1) of the Act, 21 U.S.C. section 360bbb-3(b)(1), unless the authorization is terminated or revoked sooner.  Performed at [facility], [place] [address] [place], [place] [address]   Blood culture (routine single)     Status: Abnormal   Collection Time: 03/22/20 10:58 PM   Specimen: BLOOD  Result Value Ref Range Status   Specimen Description   Final    BLOOD LEFT WRIST Performed at [facility] [address], [place], [place] [address]    Special Requests   Final    BOTTLES DRAWN AEROBIC AND ANAEROBIC Blood Culture adequate  volume Performed at [facility] [address], [place], [place] [address]    Culture  Setup Time   Final    IN BOTH AEROBIC AND ANAEROBIC BOTTLES GRAM POSITIVE COCCI Organism ID to follow CRITICAL RESULT CALLED TO, READ BACK BY AND VERIFIED WITHCristopher Estimable [facility] 03/23/20 2009 JDW Performed at [facility], [place] [address] [place], [place] [address]    Culture METHICILLIN RESISTANT STAPHYLOCOCCUS AUREUS (A)  Final   Report Status 03/25/2020 FINAL  Final   Organism ID, Bacteria METHICILLIN RESISTANT STAPHYLOCOCCUS AUREUS  Final      Susceptibility   Methicillin resistant staphylococcus aureus - MIC*    CIPROFLOXACIN >=8 RESISTANT Resistant     ERYTHROMYCIN >=  8 RESISTANT Resistant     GENTAMICIN <=0.5 SENSITIVE Sensitive     OXACILLIN >=4 RESISTANT Resistant     TETRACYCLINE <=1 SENSITIVE Sensitive     VANCOMYCIN 1 SENSITIVE Sensitive     TRIMETH/SULFA <=10 SENSITIVE Sensitive     CLINDAMYCIN >=8 RESISTANT Resistant     RIFAMPIN <=0.5 SENSITIVE Sensitive     Inducible Clindamycin NEGATIVE Sensitive     * METHICILLIN RESISTANT STAPHYLOCOCCUS AUREUS  Blood Culture ID Panel (Reflexed)     Status: Abnormal   Collection Time: 03/22/20 10:58 PM  Result Value Ref Range Status   Enterococcus faecalis NOT DETECTED NOT DETECTED Final   Enterococcus Faecium NOT DETECTED NOT DETECTED Final   Listeria monocytogenes NOT DETECTED NOT DETECTED Final   Staphylococcus species DETECTED (A) NOT DETECTED Final    Comment: CRITICAL RESULT CALLED TO, READ BACK BY AND VERIFIED WITH: E WILLIAMSON PHARMD 03/23/20 2009 JDW    Staphylococcus aureus (BCID) DETECTED (A) NOT DETECTED Final    Comment: Methicillin (oxacillin)-resistant Staphylococcus aureus (MRSA). MRSA is predictably resistant to beta-lactam antibiotics (except ceftaroline). Preferred therapy is vancomycin unless clinically contraindicated. Patient requires contact precautions if  hospitalized. CRITICAL RESULT CALLED  TO, READ BACK BY AND VERIFIED WITH: E WILLIAMSON PHARMD 03/23/20 2009 JDW    Staphylococcus epidermidis NOT DETECTED NOT DETECTED Final   Staphylococcus lugdunensis NOT DETECTED NOT DETECTED Final   Streptococcus species NOT DETECTED NOT DETECTED Final   Streptococcus agalactiae NOT DETECTED NOT DETECTED Final   Streptococcus pneumoniae NOT DETECTED NOT DETECTED Final   Streptococcus pyogenes NOT DETECTED NOT DETECTED Final   A.calcoaceticus-baumannii NOT DETECTED NOT DETECTED Final   Bacteroides fragilis NOT DETECTED NOT DETECTED Final   Enterobacterales NOT DETECTED NOT DETECTED Final   Enterobacter cloacae complex NOT DETECTED NOT DETECTED Final   Escherichia coli NOT DETECTED NOT DETECTED Final   Klebsiella aerogenes NOT DETECTED NOT DETECTED Final   Klebsiella oxytoca NOT DETECTED NOT DETECTED Final   Klebsiella pneumoniae NOT DETECTED NOT DETECTED Final   Proteus species NOT DETECTED NOT DETECTED Final   Salmonella species NOT DETECTED NOT DETECTED Final   Serratia marcescens NOT DETECTED NOT DETECTED Final   Haemophilus influenzae NOT DETECTED NOT DETECTED Final   Neisseria meningitidis NOT DETECTED NOT DETECTED Final   Pseudomonas aeruginosa NOT DETECTED NOT DETECTED Final   Stenotrophomonas maltophilia NOT DETECTED NOT DETECTED Final   Candida albicans NOT DETECTED NOT DETECTED Final   Candida auris NOT DETECTED NOT DETECTED Final   Candida glabrata NOT DETECTED NOT DETECTED Final   Candida krusei NOT DETECTED NOT DETECTED Final   Candida parapsilosis NOT DETECTED NOT DETECTED Final   Candida tropicalis NOT DETECTED NOT DETECTED Final   Cryptococcus neoformans/gattii NOT DETECTED NOT DETECTED Final   Meth resistant mecA/C and MREJ DETECTED (A) NOT DETECTED Final    Comment: CRITICAL RESULT CALLED TO, READ BACK BY AND VERIFIED WITHCristopher Estimable [facility] 03/23/20 2009 JDW Performed at [facility], [address] [place], [place] [address]   Urine culture     Status:  Abnormal   Collection Time: 03/23/20  2:22 AM   Specimen: In/Out Cath Urine  Result Value Ref Range Status   Specimen Description   Final    IN/OUT CATH URINE Performed at [facility] [address] [place], [place] [address]    Special Requests   Final    NONE Performed at [facility], [place] [address] [place], [place] [address]    Culture (  A)  Final    <10,000 COLONIES/mL INSIGNIFICANT GROWTH Performed at [facility] [address], [place], [place] [address]    Report Status 03/23/2020 FINAL  Final  Culture, blood (Routine X 2) w Reflex to ID Panel     Status: None (Preliminary result)   Collection Time: 03/23/20 11:14 PM   Specimen: BLOOD RIGHT HAND  Result Value Ref Range Status   Specimen Description   Final    BLOOD RIGHT HAND Performed at [facility], [place] [address], [place], [place] [address]    Special Requests   Final    BOTTLES DRAWN AEROBIC ONLY Blood Culture adequate volume Performed at [facility] [address], [place], [place] [address]    Culture PENDING  Incomplete   Report Status PENDING  Incomplete  Culture, blood (Routine X 2) w Reflex to ID Panel     Status: None (Preliminary result)   Collection Time: 03/23/20 11:14 PM   Specimen: BLOOD RIGHT WRIST  Result Value Ref Range Status   Specimen Description   Final    BLOOD RIGHT WRIST Performed at [facility], [address] [place], [place] [address]    Special Requests   Final    BOTTLES DRAWN AEROBIC ONLY Blood Culture adequate volume Performed at [facility] [address], [place], [place] [address]    Culture PENDING  Incomplete   Report Status PENDING  Incomplete    Michel Bickers, MD [facility] [place] [facility] [phone] pager   [phone] cell 03/25/2020, 10:49 AM

## 2020-03-26 DIAGNOSIS — I471 Supraventricular tachycardia: Secondary | ICD-10-CM

## 2020-03-26 DIAGNOSIS — I420 Dilated cardiomyopathy: Secondary | ICD-10-CM

## 2020-03-26 LAB — CBC WITH DIFFERENTIAL/PLATELET
Abs Immature Granulocytes: 0.02 10*3/uL (ref 0.00–0.07)
Basophils Absolute: 0 10*3/uL (ref 0.0–0.1)
Basophils Relative: 1 %
Eosinophils Absolute: 0.6 10*3/uL — ABNORMAL HIGH (ref 0.0–0.5)
Eosinophils Relative: 11 %
HCT: 33.3 % — ABNORMAL LOW (ref 36.0–46.0)
Hemoglobin: 10.7 g/dL — ABNORMAL LOW (ref 12.0–15.0)
Immature Granulocytes: 0 %
Lymphocytes Relative: 19 %
Lymphs Abs: 1 10*3/uL (ref 0.7–4.0)
MCH: 29.5 pg (ref 26.0–34.0)
MCHC: 32.1 g/dL (ref 30.0–36.0)
MCV: 91.7 fL (ref 80.0–100.0)
Monocytes Absolute: 0.5 10*3/uL (ref 0.1–1.0)
Monocytes Relative: 9 %
Neutro Abs: 3.2 10*3/uL (ref 1.7–7.7)
Neutrophils Relative %: 60 %
Platelets: 273 10*3/uL (ref 150–400)
RBC: 3.63 MIL/uL — ABNORMAL LOW (ref 3.87–5.11)
RDW: 15.9 % — ABNORMAL HIGH (ref 11.5–15.5)
WBC: 5.4 10*3/uL (ref 4.0–10.5)
nRBC: 0 % (ref 0.0–0.2)

## 2020-03-26 LAB — BASIC METABOLIC PANEL
Anion gap: 10 (ref 5–15)
BUN: 5 mg/dL — ABNORMAL LOW (ref 8–23)
CO2: 21 mmol/L — ABNORMAL LOW (ref 22–32)
Calcium: 8.9 mg/dL (ref 8.9–10.3)
Chloride: 110 mmol/L (ref 98–111)
Creatinine, Ser: 0.64 mg/dL (ref 0.44–1.00)
GFR calc Af Amer: >60 mL/min (ref >60)
GFR calc non Af Amer: >60 mL/min (ref >60)
Glucose, Bld: 96 mg/dL (ref 70–99)
Potassium: 3.6 mmol/L (ref 3.5–5.1)
Sodium: 141 mmol/L (ref 135–145)

## 2020-03-26 LAB — VANCOMYCIN, TROUGH: Vancomycin Tr: 16 ug/mL (ref 15–20)

## 2020-03-26 LAB — MAGNESIUM: Magnesium: 2.5 mg/dL — ABNORMAL HIGH (ref 1.7–2.4)

## 2020-03-26 MED ORDER — POTASSIUM CHLORIDE 20 MEQ PO PACK
40.0000 meq | PACK | Freq: Once | ORAL | Status: AC
  Administered 2020-03-26: 40 meq via ORAL
  Filled 2020-03-26: qty 2

## 2020-03-26 MED ORDER — POTASSIUM CHLORIDE CRYS ER 20 MEQ PO TBCR: 40.0000 meq | EXTENDED_RELEASE_TABLET | Freq: Once | ORAL | Status: DC

## 2020-03-26 MED ORDER — METOPROLOL SUCCINATE ER 25 MG PO TB24
25.0000 mg | ORAL_TABLET | Freq: Every day | ORAL | Status: DC
  Administered 2020-03-26 – 2020-03-29 (×4): 25 mg via ORAL
  Filled 2020-03-26 (×4): qty 1

## 2020-03-26 MED ORDER — ERLOTINIB HCL 150 MG PO TABS
150.0000 mg | ORAL_TABLET | Freq: Every day | ORAL | Status: DC
  Filled 2020-03-26: qty 1

## 2020-03-26 MED ORDER — ERLOTINIB HCL 150 MG PO TABS
150.0000 mg | ORAL_TABLET | Freq: Every day | ORAL | Status: DC
  Administered 2020-03-28: 150 mg via ORAL

## 2020-03-26 NOTE — Progress Notes (Signed)
Pharmacy Antibiotic Note  Valerie [name] is a 74 y.o. female admitted on 03/22/2020 with MRSA bacteremia.  Pharmacy has been consulted for vancomycin dosing.  Today, 03/26/20  WBC WNL  SCr WNL, CrCl ~ 60 mL/min  Afebrile  VT = 16 mcg/mL is therapeutic on current regimen of vancomycin 750 mg IV q12h  Today is day #3 of vancomycin.  Plan:  Continue vancomycin 750 mg IV q12h  Monitor renal function  Planning for TEE on 8/9 to r/o endocarditis  Height: 5\' 7"  (170.2 cm) Weight: 69.8 kg (153 lb 12.8 oz) IBW/kg (Calculated) : 61.6  Temp (24hrs), Avg:98.2 F (36.8 C), Min:98 F (36.7 C), Max:98.4 F (36.9 C)  Recent Labs  Lab 03/22/20 2257 03/22/20 2330 03/23/20 0115 03/23/20 0446 03/24/20 0431 03/25/20 0425 03/26/20 0502 03/26/20 1114  WBC 10.8*  --   --  8.2 6.4 5.5 5.4  --   CREATININE 0.90  --   --  0.81 0.74 0.78 0.64  --   LATICACIDVEN  --  1.7 1.7  --   --   --   --   --   VANCOTROUGH  --   --   --   --   --   --   --  16    Estimated Creatinine Clearance: 60 mL/min (by C-G formula based on SCr of 0.64 mg/dL).    Allergies  Allergen Reactions  . Keflex [Cephalexin] Itching and Rash    Throat swelling- Patient has tolerated Rocephin & Cefdinir since this reaction    Antimicrobials this admission: 8/4 Ceftriaxone >> 8/5 8/4 Vancomycin >>  Dose adjustments this admission:  Microbiology results: 8/3 BCx: MRSA 8/3 UCx: insignificant growth 8/4 BCx: ngtd  Thank you for allowing pharmacy to be a part of this patient's care.  Lenis Noon, PharmD 03/26/20 6:06 PM

## 2020-03-26 NOTE — Progress Notes (Signed)
PROGRESS NOTE    Valerie [name]  JGG:[id number] DOB: 22-Nov-1945 DOA: 03/22/2020 PCP: [name], MD    Chief Complaint  Patient presents with  . Altered Mental Status  . Fatigue    Brief Narrative:  [name] is a 74 y.o. 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  History is obtained from husband phone and daughter at bedside.  Patient was unaware of events surrounding her altered mental status.  Reportedly patient was talking on the phone with her sister today when she fell asleep and dropped her phone.  They later realized she was less responsive and decided to bring her to the ED.  She did receive Benadryl about an hour prior for some generalized itching.  She was diagnosed with UTI with symptoms of dysuria about 3 days ago and has been taking Cipro.  Otherwise no fever.  No nausea, vomiting or worsening diarrhea.Pt continues to have dysuria and notes some lower abdominal pain today.  In the ED, she was afebrile, had mild leukocytosis of 10.8, stable mild normocytic anemia with hemoglobin of 11.4.  EKG had prolonged QTC of 570 and new inverted T waves in V1 to V2 compared to prior on my review.  ED PA discussed these findings with cardiology fellow Dr. Kalman Shan who was concerned about possible elevated intracranial pressure and CT head was obtained and normal.  Differential diagnosis remains broad per cardiology but he has high threshold for any further ischemic evaluation.   Troponin of 67.  Magnesium of 2.  Potassium of 2.8.  Assessment & Plan:   Principal Problem:   MRSA bacteremia Active Problems:   Primary cancer of right upper lobe of lung ([place])   Malignant neoplasm of brain ([place])   History of DVT of lower  extremity   Congestive heart failure (CHF) (H[facility])   Elevated troponin   Dilated cardiomyopathy ([facility])   AMS (altered mental status)   Hypokalemia   Prolonged QT interval   DNR (do not resuscitate)   Normocytic anemia   Urinary tract infection without hematuria   SVT (supraventricular tachycardia) ([place])  1 MRSA bacteremia Questionable etiology.  Patient admitted with altered mental status/acute encephalopathy felt initially secondary to UTI as patient with history of brain mets.  Patient pancultured and 1 set of blood cultures initially drawn on admission was positive for MRSA.  Repeat blood cultures x2 obtained prior to initiation of IV vancomycin.  2D echo with EF of 25 to 30%, global hypokinesis, grade 1 diastolic dysfunction, no vegetations seen. Unchanged from prior 2D echo. ID recommending TEE.  Cardiology consulted and patient for TEE on Monday, 03/28/2020.   IV Rocephin has been discontinued. Continue IV vancomycin. ID following and I appreciate the input and recommendations.   2.  Acute metabolic encephalopathy Likely secondary to problem #1 and probable UTI in the setting of brain mets and possibly secondary to fluoroquinolone.  Urine cultures with insignificant growth.  Clinical improvement.  Continue IV vancomycin.  IV Rocephin has been discontinued.  Supportive care.  3.  Hypokalemia .  Potassium at 3.6.  Continue oral daily potassium supplementation.  We will give an extra dose of oral potassium.  Follow.  4.  Prolonged QT Repeat EKG with QTc prolongation.  Hold any QT prolonging medications.  Resume beta-blocker.  Repeat  EKG.  Keep potassium > 4.  Keep magnesium > 2.  Repeat EKG in the morning.  5.  History of DVT Continue Xarelto.  6.  Elevated troponin Likely secondary to a demand ischemia.  Cardiology, Dr. Kalman Shan evaluated EKG changes and feels low suspicion for ischemic disease.  Patient asymptomatic.  Repeat troponins trended down.  2D echo with EF of 25 to 30%, global  hypokinesis, grade 1 diastolic dysfunction, no vegetations seen. Unchanged from prior 2D echo.Follow.  7.  Chronic combined CHF/dilated cardiomyopathy Currently euvolemic.  Resume home regimen Toprol-XL.  Continue Lipitor, Plavix.  Follow.  8.  History of non-small cell lung cancer with mets to the brain and neck CT head negative for any acute findings.  Patient on oral chemotherapy.  Patient currently with MRSA bacteremia and ideally would like to keep patient off Tarceva.  Per RN family insistent and looks like family have contacted the cancer center and per notes in chart Dr. Earlie Server recommending patient be continued on Tarceva. Case discussed with Dr. Earlie Server who stated that if patient improves clinically could likely resume Tarceva in the next 1 to 2 days as it is an inhibitor of tyrosine kinase of EGFR and patient should do fine with it in spite of ongoing infection. Oncology assessed the patient the morning of 03/25/2020.  Will resume Tarceva per oncology recommendations.  Follow.   9. Bacteriuria Patient prior to admission was being treated for UTI with ciprofloxacin.  Urinalysis done on admission nitrite positive negative leukocytes 0-5 WBCs.  Urine cultures with insignificant growth.  Patient was on IV Rocephin which has subsequently been discontinued.  10.  SVT Patient noted to have a brief episode of SVT lasting 5 minutes around 1620 hrs. while attempting to ambulate to chair with heart rate going up as high as the 140s to the 160s however remained asymptomatic per RN.  Heart rate improved to the 90s to low 100s.  We will give another dose of potassium packet 40 mEq p.o. x1 to keep potassium greater than 4.  Will resume patient's home regimen of Toprol-XL.  Follow.   DVT prophylaxis: Xarelto Code Status: DNR Family Communication: Updated patient.  No family at bedside. Disposition:   Status is: Inpatient    Dispo: The patient is from: Home              Anticipated d/c is to:  Likely home.  To be determined.              Anticipated d/c date is: To be determined              Patient currently with MRSA bacteremia, on IV antibiotics, history of metastatic lung cancer on chemotherapy. Needs TEE which will be done on Monday, 03/28/2020. Not stable for discharge.       Consultants:   ID: Dr. Megan Salon 03/24/2020  Curb sided/spoke with Dr. Curt Bears oncology  Procedures:   2D echo 03/24/2020  CT head 03/22/2020  Chest x-ray 11/07/2019  TEE pending for 03/28/2020  Antimicrobials:   IV vancomycin 03/23/2020  IV Rocephin 03/23/2020>>>> 03/24/2020   Subjective: Patient sitting up in bed.  Denies any chest pain or shortness of breath.  Tolerating current diet.  Feeling well.  Patient noted to have a run of SVT this afternoon around 1620 hrs however remained asymptomatic per RN.  Objective: Vitals:   03/25/20 1353 03/25/20 2130 03/26/20 0621 03/26/20 1240  BP: 120/61 134/61 (!) 122/51 118/90  Pulse: 79 81 80 99  Resp:  _0 Temp:  98.4 F (36.9 C) 98.2 F (36.8 C) 98 F (36.7 C)  TempSrc:  Oral Oral Oral  SpO2: 92% 100% 97% 98%  Weight:      Height:        Intake/Output Summary (Last 24 hours) at 03/26/2020 1803 Last data filed at 03/26/2020 1240 Gross per 24 hour  Intake 1130 ml  Output 1200 ml  Net -70 ml   Filed Weights   03/22/20 2248 03/23/20 2230  Weight: 73.9 kg 69.8 kg    Examination:  General exam: NAD Respiratory system: Lungs clear to auscultation bilaterally.  No wheezes, no crackles, no rhonchi.  Normal respiratory effort. Cardiovascular system: RRR no murmurs rubs or gallops.  No JVD.  No lower extremity edema.  Gastrointestinal system: Abdomen is nontender, nondistended, soft, positive bowel sounds.  No rebound.  No guarding.  Central nervous system: Alert and oriented. No focal neurological deficits. Extremities: Symmetric 5 x 5 power. Skin: Band-Aid noted around left breast where patient stated had a mole  removed. Psychiatry: Judgement and insight appear normal. Mood & affect appropriate.     Data Reviewed: I have personally reviewed following labs and imaging studies  CBC: Recent Labs  Lab 03/22/20 2257 03/23/20 0446 03/24/20 0431 03/25/20 0425 03/26/20 0502  WBC 10.8* 8.2 6.4 5.5 5.4  NEUTROABS 9.4*  --  4.3 3.5 3.2  HGB 11.4* 9.5* 10.4* 9.9* 10.7*  HCT 36.8 30.8* 34.2* 32.1* 33.3*  MCV 94.8 94.8 94.0 93.9 91.7  PLT 275 251 253 269 676    Basic Metabolic Panel: Recent Labs  Lab 03/22/20 2257 03/22/20 2329 03/23/20 0446 03/24/20 0431 03/25/20 0425 03/26/20 0502  NA 140  --  138 136 139 141  K 2.8*  --  3.7 3.6 3.5 3.6  CL 106  --  106 107 109 110  CO2 26  --  _1 21*  GLUCOSE 142*  --  144* 100* 90 96  BUN 13  --  _2 5*  CREATININE 0.90  --  0.81 0.74 0.78 0.64  CALCIUM 9.0  --  8.5* 8.6* 8.8* 8.9  MG  --  2.0  --  1.9 1.8 2.5*    GFR: Estimated Creatinine Clearance: 60 mL/min (by C-G formula based on SCr of 0.64 mg/dL).  Liver Function Tests: Recent Labs  Lab 03/22/20 2257  AST 29  ALT 22  ALKPHOS 82  BILITOT 1.2  PROT 6.0*  ALBUMIN 3.6    CBG: No results for input(s): GLUCAP in the last 168 hours.   Recent Results (from the past 240 hour(s))  SARS Coronavirus 2 by RT PCR (hospital order, performed in [facility] hospital lab) Nasopharyngeal Nasopharyngeal Swab     Status: None   Collection Time: 03/22/20 10:57 PM   Specimen: Nasopharyngeal Swab  Result Value Ref Range Status   SARS Coronavirus 2 NEGATIVE NEGATIVE Final    Comment: (NOTE) SARS-CoV-2 target nucleic acids are NOT DETECTED.  The SARS-CoV-2 RNA is generally detectable in upper and lower respiratory specimens during the acute phase of infection. The lowest concentration of SARS-CoV-2 viral copies this assay can detect is 250 copies / mL. A negative result does not preclude SARS-CoV-2 infection and should not be used as the sole basis for treatment or other patient  management decisions.  A negative result may occur with improper specimen collection / handling, submission of specimen other than nasopharyngeal swab, presence of viral mutation(s) within the areas targeted by  this assay, and inadequate number of viral copies (<250 copies / mL). A negative result must be combined with clinical observations, patient history, and epidemiological information.  Fact Sheet for Patients:   StrictlyIdeas.no  Fact Sheet for Healthcare Providers: BankingDealers.co.za  This test is not yet approved or  cleared by the [place] FDA and has been authorized for detection and/or diagnosis of SARS-CoV-2 by FDA under an Emergency Use Authorization (EUA).  This EUA will remain in effect (meaning this test can be used) for the duration of the COVID-19 declaration under Section 564(b)(1) of the Act, 21 U.S.C. section 360bbb-3(b)(1), unless the authorization is terminated or revoked sooner.  Performed at [facility], [place] [address], [place], [place] [address]   Blood culture (routine single)     Status: Abnormal   Collection Time: 03/22/20 10:58 PM   Specimen: BLOOD  Result Value Ref Range Status   Specimen Description   Final    BLOOD LEFT WRIST Performed at [facility] [address] [place], [place] [address]    Special Requests   Final    BOTTLES DRAWN AEROBIC AND ANAEROBIC Blood Culture adequate volume Performed at [facility] [address], [place], [place] [address]    Culture  Setup Time   Final    IN BOTH AEROBIC AND ANAEROBIC BOTTLES GRAM POSITIVE COCCI Organism ID to follow CRITICAL RESULT CALLED TO, READ BACK BY AND VERIFIED WITHCristopher Estimable [facility] 03/23/20 2009 JDW Performed at [facility], [place] [address], [place], [place] [address]    Culture METHICILLIN RESISTANT STAPHYLOCOCCUS AUREUS (A)  Final   Report Status 03/25/2020  FINAL  Final   Organism ID, Bacteria METHICILLIN RESISTANT STAPHYLOCOCCUS AUREUS  Final      Susceptibility   Methicillin resistant staphylococcus aureus - MIC*    CIPROFLOXACIN >=8 RESISTANT Resistant     ERYTHROMYCIN >=8 RESISTANT Resistant     GENTAMICIN <=0.5 SENSITIVE Sensitive     OXACILLIN >=4 RESISTANT Resistant     TETRACYCLINE <=1 SENSITIVE Sensitive     VANCOMYCIN 1 SENSITIVE Sensitive     TRIMETH/SULFA <=10 SENSITIVE Sensitive     CLINDAMYCIN >=8 RESISTANT Resistant     RIFAMPIN <=0.5 SENSITIVE Sensitive     Inducible Clindamycin NEGATIVE Sensitive     * METHICILLIN RESISTANT STAPHYLOCOCCUS AUREUS  Blood Culture ID Panel (Reflexed)     Status: Abnormal   Collection Time: 03/22/20 10:58 PM  Result Value Ref Range Status   Enterococcus faecalis NOT DETECTED NOT DETECTED Final   Enterococcus Faecium NOT DETECTED NOT DETECTED Final   Listeria monocytogenes NOT DETECTED NOT DETECTED Final   Staphylococcus species DETECTED (A) NOT DETECTED Final    Comment: CRITICAL RESULT CALLED TO, READ BACK BY AND VERIFIED WITH: E WILLIAMSON PHARMD 03/23/20 2009 JDW    Staphylococcus aureus (BCID) DETECTED (A) NOT DETECTED Final    Comment: Methicillin (oxacillin)-resistant Staphylococcus aureus (MRSA). MRSA is predictably resistant to beta-lactam antibiotics (except ceftaroline). Preferred therapy is vancomycin unless clinically contraindicated. Patient requires contact precautions if  hospitalized. CRITICAL RESULT CALLED TO, READ BACK BY AND VERIFIED WITH: E WILLIAMSON PHARMD 03/23/20 2009 JDW    Staphylococcus epidermidis NOT DETECTED NOT DETECTED Final   Staphylococcus lugdunensis NOT DETECTED NOT DETECTED Final   Streptococcus species NOT DETECTED NOT DETECTED Final   Streptococcus agalactiae NOT DETECTED NOT DETECTED Final   Streptococcus pneumoniae NOT DETECTED NOT DETECTED Final   Streptococcus pyogenes NOT DETECTED NOT DETECTED Final   A.calcoaceticus-baumannii  NOT DETECTED NOT  DETECTED Final   Bacteroides fragilis NOT DETECTED NOT DETECTED Final   Enterobacterales NOT DETECTED NOT DETECTED Final   Enterobacter cloacae complex NOT DETECTED NOT DETECTED Final   Escherichia coli NOT DETECTED NOT DETECTED Final   Klebsiella aerogenes NOT DETECTED NOT DETECTED Final   Klebsiella oxytoca NOT DETECTED NOT DETECTED Final   Klebsiella pneumoniae NOT DETECTED NOT DETECTED Final   Proteus species NOT DETECTED NOT DETECTED Final   Salmonella species NOT DETECTED NOT DETECTED Final   Serratia marcescens NOT DETECTED NOT DETECTED Final   Haemophilus influenzae NOT DETECTED NOT DETECTED Final   Neisseria meningitidis NOT DETECTED NOT DETECTED Final   Pseudomonas aeruginosa NOT DETECTED NOT DETECTED Final   Stenotrophomonas maltophilia NOT DETECTED NOT DETECTED Final   Candida albicans NOT DETECTED NOT DETECTED Final   Candida auris NOT DETECTED NOT DETECTED Final   Candida glabrata NOT DETECTED NOT DETECTED Final   Candida krusei NOT DETECTED NOT DETECTED Final   Candida parapsilosis NOT DETECTED NOT DETECTED Final   Candida tropicalis NOT DETECTED NOT DETECTED Final   Cryptococcus neoformans/gattii NOT DETECTED NOT DETECTED Final   Meth resistant mecA/C and MREJ DETECTED (A) NOT DETECTED Final    Comment: CRITICAL RESULT CALLED TO, READ BACK BY AND VERIFIED WITHCristopher Estimable [facility] 03/23/20 2009 JDW Performed at [facility], [address] [place], [place] [address]   Urine culture     Status: Abnormal   Collection Time: 03/23/20  2:22 AM   Specimen: In/Out Cath Urine  Result Value Ref Range Status   Specimen Description   Final    IN/OUT CATH URINE Performed at [facility] [address], [place], [place] [address]    Special Requests   Final    NONE Performed at [facility], [place] [address], [place], [place] [address]    Culture (A)  Final    <10,000 COLONIES/mL INSIGNIFICANT GROWTH Performed at [facility] [address] [place], [place] [address]    Report Status 03/23/2020 FINAL  Final  Culture, blood (Routine X 2) w Reflex to ID Panel     Status: None (Preliminary result)   Collection Time: 03/23/20 11:14 PM   Specimen: BLOOD RIGHT HAND  Result Value Ref Range Status   Specimen Description   Final    BLOOD RIGHT HAND Performed at [facility], [place] [address], [place], [place] [address]    Special Requests   Final    BOTTLES DRAWN AEROBIC ONLY Blood Culture adequate volume Performed at [facility] [address] [place], [place] [address]    Culture   Final    NO GROWTH 2 DAYS Performed at [facility] [address] [place], [place] [address]    Report Status PENDING  Incomplete  Culture, blood (Routine X 2) w Reflex to ID Panel     Status: None (Preliminary result)   Collection Time: 03/23/20 11:14 PM   Specimen: BLOOD RIGHT WRIST  Result Value Ref Range Status   Specimen Description   Final    BLOOD RIGHT WRIST Performed at [facility], [place] [address], [place], [place] [address]    Special Requests   Final    BOTTLES DRAWN AEROBIC ONLY Blood Culture adequate volume Performed at [facility] [address] [place],  [address]    Culture   Final    NO GROWTH 2 DAYS Performed  at [facility], [place] [address], [place], [place] [address]    Report Status PENDING  Incomplete         Radiology Studies: No results found.      Scheduled Meds: . atorvastatin  80 mg Oral QHS  . clopidogrel  75 mg Oral Q breakfast  . metoprolol succinate  25 mg Oral Daily  . potassium chloride  40 mEq Oral Daily  . potassium chloride  40 mEq Oral Once  . rivaroxaban  20 mg Oral QPM  . vitamin B-12  1,000 mcg Oral QPM   Continuous Infusions: . vancomycin 750 mg (03/26/20 1201)     LOS: 2 days    Time spent: 40 minutes    Irine Seal, MD [facility]   To contact the attending  provider between 7A-7P or the covering provider during after hours 7P-7A, please log into the web site [email or web address] and access using universal [place] password for that web site. If you do not have the password, please call the hospital operator.  03/26/2020, 6:03 PM

## 2020-03-26 NOTE — Progress Notes (Signed)
Pt had a brief episode of SVT lasting five minutes this shift at 1620 while attempting to ambulate to the chair. HR up to 140's to 160's. Pt remained asymptomatic throughout. HR now upper 90's to low 100's. Pt in no acute distress, MD notified. Will continue to monitor.

## 2020-03-27 DIAGNOSIS — D649 Anemia, unspecified: Secondary | ICD-10-CM

## 2020-03-27 LAB — BASIC METABOLIC PANEL
Anion gap: 9 (ref 5–15)
BUN: 7 mg/dL — ABNORMAL LOW (ref 8–23)
CO2: 20 mmol/L — ABNORMAL LOW (ref 22–32)
Calcium: 9.1 mg/dL (ref 8.9–10.3)
Chloride: 108 mmol/L (ref 98–111)
Creatinine, Ser: 0.82 mg/dL (ref 0.44–1.00)
GFR calc Af Amer: >60 mL/min (ref >60)
GFR calc non Af Amer: >60 mL/min (ref >60)
Glucose, Bld: 97 mg/dL (ref 70–99)
Potassium: 3.9 mmol/L (ref 3.5–5.1)
Sodium: 137 mmol/L (ref 135–145)

## 2020-03-27 LAB — CBC
HCT: 33.9 % — ABNORMAL LOW (ref 36.0–46.0)
Hemoglobin: 10.7 g/dL — ABNORMAL LOW (ref 12.0–15.0)
MCH: 29.7 pg (ref 26.0–34.0)
MCHC: 31.6 g/dL (ref 30.0–36.0)
MCV: 94.2 fL (ref 80.0–100.0)
Platelets: 300 10*3/uL (ref 150–400)
RBC: 3.6 MIL/uL — ABNORMAL LOW (ref 3.87–5.11)
RDW: 16.4 % — ABNORMAL HIGH (ref 11.5–15.5)
WBC: 6.3 10*3/uL (ref 4.0–10.5)
nRBC: 0 % (ref 0.0–0.2)

## 2020-03-27 LAB — MAGNESIUM: Magnesium: 2.2 mg/dL (ref 1.7–2.4)

## 2020-03-27 MED ORDER — GUAIFENESIN-DM 100-10 MG/5ML PO SYRP
5.0000 mL | ORAL_SOLUTION | ORAL | Status: DC | PRN
  Filled 2020-03-27: qty 10

## 2020-03-27 MED ORDER — ACETAMINOPHEN 325 MG PO TABS
650.0000 mg | ORAL_TABLET | ORAL | Status: DC | PRN
  Administered 2020-03-27 – 2020-03-29 (×4): 650 mg via ORAL
  Filled 2020-03-27 (×4): qty 2

## 2020-03-27 NOTE — Progress Notes (Signed)
PROGRESS NOTE    Valerie [name]  OQH:[id number] DOB: 06-14-1946 DOA: 03/22/2020 PCP: [name], MD    Chief Complaint  Patient presents with  . Altered Mental Status  . Fatigue    Brief Narrative:  [name] is a 74 y.o. 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  History is obtained from husband phone and daughter at bedside.  Patient was unaware of events surrounding her altered mental status.  Reportedly patient was talking on the phone with her sister today when she fell asleep and dropped her phone.  They later realized she was less responsive and decided to bring her to the ED.  She did receive Benadryl about an hour prior for some generalized itching.  She was diagnosed with UTI with symptoms of dysuria about 3 days ago and has been taking Cipro.  Otherwise no fever.  No nausea, vomiting or worsening diarrhea.Pt continues to have dysuria and notes some lower abdominal pain today.  In the ED, she was afebrile, had mild leukocytosis of 10.8, stable mild normocytic anemia with hemoglobin of 11.4.  EKG had prolonged QTC of 570 and new inverted T waves in V1 to V2 compared to prior on my review.  ED PA discussed these findings with cardiology fellow Dr. Kalman Shan who was concerned about possible elevated intracranial pressure and CT head was obtained and normal.  Differential diagnosis remains broad per cardiology but he has high threshold for any further ischemic evaluation.   Troponin of 67.  Magnesium of 2.  Potassium of 2.8.  Assessment & Plan:   Principal Problem:   MRSA bacteremia Active Problems:   Primary cancer of right upper lobe of lung ([place])   Malignant neoplasm of brain ([place])   History of DVT of lower  extremity   Congestive heart failure (CHF) (H[facility])   Elevated troponin   Dilated cardiomyopathy ([facility])   AMS (altered mental status)   Hypokalemia   Prolonged Q-T interval on ECG   DNR (do not resuscitate)   Normocytic anemia   Urinary tract infection without hematuria   SVT (supraventricular tachycardia) ([place])  1 MRSA bacteremia Questionable etiology.  Patient admitted with altered mental status/acute encephalopathy felt initially secondary to UTI as patient with history of brain mets.  Patient pancultured and 1 set of blood cultures initially drawn on admission was positive for MRSA.  Repeat blood cultures x2 obtained prior to initiation of IV vancomycin have remained negative to date..  2D echo with EF of 25 to 30%, global hypokinesis, grade 1 diastolic dysfunction, no vegetations seen. Unchanged from prior 2D echo. ID recommending TEE.  Cardiology consulted and patient for TEE on Monday, 03/28/2020.   IV Rocephin has been discontinued. Continue IV vancomycin.  Per ID patient is resistant to the fact of a PICC line and ID recommending probable oral Zyvox on discharge.  ID following and I appreciate the input and recommendations.   2.  Acute metabolic encephalopathy Likely secondary to problem #1 and probable UTI in the setting of brain mets and possibly secondary to fluoroquinolone.  Urine cultures with insignificant growth.  Clinical improvement.  Likely close to baseline.  Continue IV vancomycin.  IV Rocephin has been discontinued.  Supportive care.  3.  Hypokalemia .  Potassium at 3.9.  Continue oral daily potassium  supplementation.   4.  Prolonged QT Repeat EKG on 03/26/2020 with resolution of QTC prolongation.  Keep potassium > 4.  Keep magnesium > 2.  Continue Toprol-XL.  Follow.  5.  History of DVT Xarelto.    6.  Elevated troponin Likely secondary to a demand ischemia.  Cardiology, Dr. Kalman Shan evaluated EKG changes and feels low suspicion for ischemic disease.  Patient asymptomatic.   Repeat troponins trended down.  2D echo with EF of 25 to 30%, global hypokinesis, grade 1 diastolic dysfunction, no vegetations seen. Unchanged from prior 2D echo.Follow.  7.  Chronic combined CHF/dilated cardiomyopathy Euvolemic.  Continue Toprol-XL, Lipitor, Plavix.  Follow.  Outpatient follow-up with cardiology.    8.  History of non-small cell lung cancer with mets to the brain and neck CT head negative for any acute findings.  Patient on oral chemotherapy.  Patient currently with MRSA bacteremia and ideally would like to keep patient off Tarceva.  Per RN family insistent and looks like family have contacted the cancer center and per notes in chart Dr. Earlie Server recommending patient be continued on Tarceva. Case discussed with Dr. Earlie Server who stated that if patient improves clinically could likely resume Tarceva in the next 1 to 2 days as it is an inhibitor of tyrosine kinase of EGFR and patient should do fine with it in spite of ongoing infection. Oncology assessed the patient the morning of 03/25/2020.  Tarceva resumed and per oncology patient may take the own supply which husband brought today.   9. Bacteriuria Patient prior to admission was being treated for UTI with ciprofloxacin.  Urinalysis done on admission nitrite positive negative leukocytes 0-5 WBCs.  Urine cultures with insignificant growth.  Patient was on IV Rocephin which has subsequently been discontinued.  10.  SVT Patient noted to have a brief episode of SVT lasting 5 minutes around 1620 hrs on 03/26/2020. while attempting to ambulate to chair with heart rate going up as high as the 140s to the 160s however remained asymptomatic per RN.  Heart rate improved to the 90s to low 100s.  Potassium repleted.  EKG with normal sinus rhythm.  Toprol-XL resumed.  Follow.    DVT prophylaxis: Xarelto Code Status: DNR Family Communication: Updated patient and husband at bedside. Disposition:   Status is: Inpatient    Dispo: The patient is  from: Home              Anticipated d/c is to: Likely home.  To be determined.              Anticipated d/c date is: To be determined              Patient currently with MRSA bacteremia, on IV antibiotics, history of metastatic lung cancer on chemotherapy. Needs TEE which will be done on Monday, 03/28/2020. Not stable for discharge.       Consultants:   ID: Dr. Megan Salon 03/24/2020  Curb sided/spoke with Dr. Curt Bears oncology  Procedures:   2D echo 03/24/2020  CT head 03/22/2020  Chest x-ray 11/07/2019  TEE pending for 03/28/2020  Antimicrobials:   IV vancomycin 03/23/2020  IV Rocephin 03/23/2020>>>> 03/24/2020   Subjective: Patient laying in bed.  Denies chest pain or shortness of breath.  Stated has had some intermittent cough.  Continue to feel better.  Tolerating diet.  Husband at bedside.    Objective: Vitals:   03/26/20 0621 03/26/20 1240 03/26/20 2332 03/27/20 0603  BP: (!) 122/51 118/90 122/73 106/68  Pulse: 80 99  83 84  Resp: _0 Temp: 98.2 F (36.8 C) 98 F (36.7 C) 98.4 F (36.9 C) 98.4 F (36.9 C)  TempSrc: Oral Oral Oral Oral  SpO2: 97% 98% 100% 97%  Weight:      Height:        Intake/Output Summary (Last 24 hours) at 03/27/2020 1331 Last data filed at 03/27/2020 0306 Gross per 24 hour  Intake 300 ml  Output --  Net 300 ml   Filed Weights   03/22/20 2248 03/23/20 2230  Weight: 73.9 kg 69.8 kg    Examination:  General exam: NAD Respiratory system: CTA B.  No wheezes, no crackles, no rhonchi.  Normal respiratory effort.  Speaking in full sentences.  Cardiovascular system: Regular rate rhythm no murmurs rubs or gallops.  No JVD.  No lower extremity edema.  Gastrointestinal system: Abdomen is soft, nontender, nondistended, positive bowel sounds.  No rebound.  No guarding.  Central nervous system: Alert and oriented. No focal neurological deficits. Extremities: Symmetric 5 x 5 power. Skin: Band-Aid noted around left breast where patient stated  had a mole removed. Psychiatry: Judgement and insight appear normal. Mood & affect appropriate.     Data Reviewed: I have personally reviewed following labs and imaging studies  CBC: Recent Labs  Lab 03/22/20 2257 03/22/20 2257 03/23/20 0446 03/24/20 0431 03/25/20 0425 03/26/20 0502 03/27/20 0500  WBC 10.8*   < > 8.2 6.4 5.5 5.4 6.3  NEUTROABS 9.4*  --   --  4.3 3.5 3.2  --   HGB 11.4*   < > 9.5* 10.4* 9.9* 10.7* 10.7*  HCT 36.8   < > 30.8* 34.2* 32.1* 33.3* 33.9*  MCV 94.8   < > 94.8 94.0 93.9 91.7 94.2  PLT 275   < > 251 253 269 273 300   < > = values in this interval not displayed.    Basic Metabolic Panel: Recent Labs  Lab 03/22/20 2257 03/22/20 2329 03/23/20 0446 03/24/20 0431 03/25/20 0425 03/26/20 0502 03/27/20 0351  NA   < >  --  138 136 139 141 137  K   < >  --  3.7 3.6 3.5 3.6 3.9  CL   < >  --  106 107 109 110 108  CO2   < >  --  _1 21* 20*  GLUCOSE   < >  --  144* 100* 90 96 97  BUN   < >  --  _2 5* 7*  CREATININE   < >  --  0.81 0.74 0.78 0.64 0.82  CALCIUM   < >  --  8.5* 8.6* 8.8* 8.9 9.1  MG  --  2.0  --  1.9 1.8 2.5* 2.2   < > = values in this interval not displayed.    GFR: Estimated Creatinine Clearance: 58.5 mL/min (by C-G formula based on SCr of 0.82 mg/dL).  Liver Function Tests: Recent Labs  Lab 03/22/20 2257  AST 29  ALT 22  ALKPHOS 82  BILITOT 1.2  PROT 6.0*  ALBUMIN 3.6    CBG: No results for input(s): GLUCAP in the last 168 hours.   Recent Results (from the past 240 hour(s))  SARS Coronavirus 2 by RT PCR (hospital order, performed in [facility] hospital lab) Nasopharyngeal Nasopharyngeal Swab     Status: None   Collection Time: 03/22/20 10:57 PM   Specimen: Nasopharyngeal Swab  Result Value Ref Range Status   SARS Coronavirus 2  NEGATIVE NEGATIVE Final    Comment: (NOTE) SARS-CoV-2 target nucleic acids are NOT DETECTED.  The SARS-CoV-2 RNA is generally detectable in upper and lower respiratory specimens  during the acute phase of infection. The lowest concentration of SARS-CoV-2 viral copies this assay can detect is 250 copies / mL. A negative result does not preclude SARS-CoV-2 infection and should not be used as the sole basis for treatment or other patient management decisions.  A negative result may occur with improper specimen collection / handling, submission of specimen other than nasopharyngeal swab, presence of viral mutation(s) within the areas targeted by this assay, and inadequate number of viral copies (<250 copies / mL). A negative result must be combined with clinical observations, patient history, and epidemiological information.  Fact Sheet for Patients:   StrictlyIdeas.no  Fact Sheet for Healthcare Providers: BankingDealers.co.za  This test is not yet approved or  cleared by the [place] FDA and has been authorized for detection and/or diagnosis of SARS-CoV-2 by FDA under an Emergency Use Authorization (EUA).  This EUA will remain in effect (meaning this test can be used) for the duration of the COVID-19 declaration under Section 564(b)(1) of the Act, 21 U.S.C. section 360bbb-3(b)(1), unless the authorization is terminated or revoked sooner.  Performed at [facility], [place] [address], [place], [place] [address]   Blood culture (routine single)     Status: Abnormal   Collection Time: 03/22/20 10:58 PM   Specimen: BLOOD  Result Value Ref Range Status   Specimen Description   Final    BLOOD LEFT WRIST Performed at [place] [address], [place], [place] [address]    Special Requests   Final    BOTTLES DRAWN AEROBIC AND ANAEROBIC Blood Culture adequate volume Performed at [facility] [address], [place], [place] [address]    Culture  Setup Time   Final    IN BOTH AEROBIC AND ANAEROBIC BOTTLES GRAM POSITIVE COCCI Organism ID to  follow CRITICAL RESULT CALLED TO, READ BACK BY AND VERIFIED WITHCristopher Estimable [facility] 03/23/20 2009 JDW Performed at [facility], [place] [address], [place], [place] [address]    Culture METHICILLIN RESISTANT STAPHYLOCOCCUS AUREUS (A)  Final   Report Status 03/25/2020 FINAL  Final   Organism ID, Bacteria METHICILLIN RESISTANT STAPHYLOCOCCUS AUREUS  Final      Susceptibility   Methicillin resistant staphylococcus aureus - MIC*    CIPROFLOXACIN >=8 RESISTANT Resistant     ERYTHROMYCIN >=8 RESISTANT Resistant     GENTAMICIN <=0.5 SENSITIVE Sensitive     OXACILLIN >=4 RESISTANT Resistant     TETRACYCLINE <=1 SENSITIVE Sensitive     VANCOMYCIN 1 SENSITIVE Sensitive     TRIMETH/SULFA <=10 SENSITIVE Sensitive     CLINDAMYCIN >=8 RESISTANT Resistant     RIFAMPIN <=0.5 SENSITIVE Sensitive     Inducible Clindamycin NEGATIVE Sensitive     * METHICILLIN RESISTANT STAPHYLOCOCCUS AUREUS  Blood Culture ID Panel (Reflexed)     Status: Abnormal   Collection Time: 03/22/20 10:58 PM  Result Value Ref Range Status   Enterococcus faecalis NOT DETECTED NOT DETECTED Final   Enterococcus Faecium NOT DETECTED NOT DETECTED Final   Listeria monocytogenes NOT DETECTED NOT DETECTED Final   Staphylococcus species DETECTED (A) NOT DETECTED Final    Comment: CRITICAL RESULT CALLED TO, READ BACK BY AND VERIFIED WITH: E WILLIAMSON PHARMD 03/23/20 2009 JDW    Staphylococcus aureus (BCID) DETECTED (A) NOT DETECTED Final  Comment: Methicillin (oxacillin)-resistant Staphylococcus aureus (MRSA). MRSA is predictably resistant to beta-lactam antibiotics (except ceftaroline). Preferred therapy is vancomycin unless clinically contraindicated. Patient requires contact precautions if  hospitalized. CRITICAL RESULT CALLED TO, READ BACK BY AND VERIFIED WITH: E WILLIAMSON PHARMD 03/23/20 2009 JDW    Staphylococcus epidermidis NOT DETECTED NOT DETECTED Final   Staphylococcus lugdunensis NOT DETECTED NOT DETECTED Final    Streptococcus species NOT DETECTED NOT DETECTED Final   Streptococcus agalactiae NOT DETECTED NOT DETECTED Final   Streptococcus pneumoniae NOT DETECTED NOT DETECTED Final   Streptococcus pyogenes NOT DETECTED NOT DETECTED Final   A.calcoaceticus-baumannii NOT DETECTED NOT DETECTED Final   Bacteroides fragilis NOT DETECTED NOT DETECTED Final   Enterobacterales NOT DETECTED NOT DETECTED Final   Enterobacter cloacae complex NOT DETECTED NOT DETECTED Final   Escherichia coli NOT DETECTED NOT DETECTED Final   Klebsiella aerogenes NOT DETECTED NOT DETECTED Final   Klebsiella oxytoca NOT DETECTED NOT DETECTED Final   Klebsiella pneumoniae NOT DETECTED NOT DETECTED Final   Proteus species NOT DETECTED NOT DETECTED Final   Salmonella species NOT DETECTED NOT DETECTED Final   Serratia marcescens NOT DETECTED NOT DETECTED Final   Haemophilus influenzae NOT DETECTED NOT DETECTED Final   Neisseria meningitidis NOT DETECTED NOT DETECTED Final   Pseudomonas aeruginosa NOT DETECTED NOT DETECTED Final   Stenotrophomonas maltophilia NOT DETECTED NOT DETECTED Final   Candida albicans NOT DETECTED NOT DETECTED Final   Candida auris NOT DETECTED NOT DETECTED Final   Candida glabrata NOT DETECTED NOT DETECTED Final   Candida krusei NOT DETECTED NOT DETECTED Final   Candida parapsilosis NOT DETECTED NOT DETECTED Final   Candida tropicalis NOT DETECTED NOT DETECTED Final   Cryptococcus neoformans/gattii NOT DETECTED NOT DETECTED Final   Meth resistant mecA/C and MREJ DETECTED (A) NOT DETECTED Final    Comment: CRITICAL RESULT CALLED TO, READ BACK BY AND VERIFIED WITHCristopher Estimable [facility] 03/23/20 2009 JDW Performed at [facility], [address], [place], [place] [address]   Urine culture     Status: Abnormal   Collection Time: 03/23/20  2:22 AM   Specimen: In/Out Cath Urine  Result Value Ref Range Status   Specimen Description   Final    IN/OUT CATH URINE Performed at [place] [address], [place], [place] [address]    Special Requests   Final    NONE Performed at [facility], [place] [address] [place], [place] [address]    Culture (A)  Final    <10,000 COLONIES/mL INSIGNIFICANT GROWTH Performed at [place] [address] [place], [place] [address]    Report Status 03/23/2020 FINAL  Final  Culture, blood (Routine X 2) w Reflex to ID Panel     Status: None (Preliminary result)   Collection Time: 03/23/20 11:14 PM   Specimen: BLOOD RIGHT HAND  Result Value Ref Range Status   Specimen Description   Final    BLOOD RIGHT HAND Performed at [facility], [place] [address], [place], [place] [address]    Special Requests   Final    BOTTLES DRAWN AEROBIC ONLY Blood Culture adequate volume Performed at [place] [address], [place], [place] [address]    Culture   Final    NO GROWTH 3 DAYS Performed at [facility], [place] [address], [place], [place] [address]    Report Status PENDING  Incomplete  Culture, blood (Routine X 2) w Reflex  to ID Panel     Status: None (Preliminary result)   Collection Time: 03/23/20 11:14 PM   Specimen: BLOOD RIGHT WRIST  Result Value Ref Range Status   Specimen Description   Final    BLOOD RIGHT WRIST Performed at [facility], [address], [place], [place] [address]    Special Requests   Final    BOTTLES DRAWN AEROBIC ONLY Blood Culture adequate volume Performed at [place] [address], [place], [place] [address]    Culture   Final    NO GROWTH 3 DAYS Performed at [facility], [place] [address], [place], [place] [address]    Report Status PENDING  Incomplete         Radiology Studies: No results found.      Scheduled Meds: . atorvastatin  80 mg Oral QHS  . clopidogrel  75 mg Oral Q breakfast  . erlotinib  150 mg Oral Daily  . metoprolol succinate  25 mg Oral Daily  . potassium chloride  40  mEq Oral Daily  . rivaroxaban  20 mg Oral QPM  . vitamin B-12  1,000 mcg Oral QPM   Continuous Infusions: . vancomycin 750 mg (03/26/20 2325)     LOS: 3 days    Time spent: 35 minutes    Irine Seal, MD [facility]   To contact the attending provider between 7A-7P or the covering provider during after hours 7P-7A, please log into the web site [email or web address] and access using universal [place] password for that web site. If you do not have the password, please call the hospital operator.  03/27/2020, 1:31 PM

## 2020-03-28 ENCOUNTER — Encounter (HOSPITAL_COMMUNITY): Payer: Self-pay | Admitting: Internal Medicine

## 2020-03-28 ENCOUNTER — Encounter (HOSPITAL_COMMUNITY)
Admission: EM | Disposition: A | Discharge status: Home / Self Care | Payer: Self-pay | Source: Home / Self Care | Attending: Internal Medicine

## 2020-03-28 ENCOUNTER — Inpatient Hospital Stay (HOSPITAL_COMMUNITY): Payer: Medicare PPO | Admitting: Anesthesiology

## 2020-03-28 ENCOUNTER — Inpatient Hospital Stay (HOSPITAL_COMMUNITY): Payer: Medicare PPO

## 2020-03-28 ENCOUNTER — Telehealth: Payer: Medicare PPO

## 2020-03-28 DIAGNOSIS — I38 Endocarditis, valve unspecified: Secondary | ICD-10-CM

## 2020-03-28 DIAGNOSIS — Q211 Atrial septal defect: Secondary | ICD-10-CM

## 2020-03-28 DIAGNOSIS — I351 Nonrheumatic aortic (valve) insufficiency: Secondary | ICD-10-CM

## 2020-03-28 HISTORY — PX: TEE WITHOUT CARDIOVERSION: SHX5443

## 2020-03-28 LAB — BASIC METABOLIC PANEL
Anion gap: 9 (ref 5–15)
BUN: 12 mg/dL (ref 8–23)
CO2: 22 mmol/L (ref 22–32)
Calcium: 9 mg/dL (ref 8.9–10.3)
Chloride: 101 mmol/L (ref 98–111)
Creatinine, Ser: 0.89 mg/dL (ref 0.44–1.00)
GFR calc Af Amer: >60 mL/min (ref >60)
GFR calc non Af Amer: >60 mL/min (ref >60)
Glucose, Bld: 90 mg/dL (ref 70–99)
Potassium: 4 mmol/L (ref 3.5–5.1)
Sodium: 132 mmol/L — ABNORMAL LOW (ref 135–145)

## 2020-03-28 LAB — CBC
HCT: 37.5 % (ref 36.0–46.0)
Hemoglobin: 11.3 g/dL — ABNORMAL LOW (ref 12.0–15.0)
MCH: 28.9 pg (ref 26.0–34.0)
MCHC: 30.1 g/dL (ref 30.0–36.0)
MCV: 95.9 fL (ref 80.0–100.0)
Platelets: 306 10*3/uL (ref 150–400)
RBC: 3.91 MIL/uL (ref 3.87–5.11)
RDW: 16.3 % — ABNORMAL HIGH (ref 11.5–15.5)
WBC: 7.7 10*3/uL (ref 4.0–10.5)
nRBC: 0 % (ref 0.0–0.2)

## 2020-03-28 SURGERY — ECHOCARDIOGRAM, TRANSESOPHAGEAL
Anesthesia: Monitor Anesthesia Care

## 2020-03-28 MED ORDER — SODIUM CHLORIDE 0.9 % IV SOLN: INTRAVENOUS | Status: DC

## 2020-03-28 MED ORDER — ONDANSETRON HCL 4 MG/2ML IJ SOLN
INTRAMUSCULAR | Status: DC | PRN
  Administered 2020-03-28: 4 mg via INTRAVENOUS

## 2020-03-28 MED ORDER — PROPOFOL 500 MG/50ML IV EMUL
INTRAVENOUS | Status: DC | PRN
  Administered 2020-03-28: 150 ug/kg/min via INTRAVENOUS

## 2020-03-28 MED ORDER — PHENYLEPHRINE HCL (PRESSORS) 10 MG/ML IV SOLN
INTRAVENOUS | Status: DC | PRN
Start: 2020-03-28 — End: 2020-03-28
  Administered 2020-03-28: 40 ug via INTRAVENOUS
  Administered 2020-03-28: 80 ug via INTRAVENOUS

## 2020-03-28 NOTE — CV Procedure (Signed)
    TRANSESOPHAGEAL ECHOCARDIOGRAM   NAME:  Valerie [name]   MRN: [id number] DOB:  Jan 19, 1946   ADMIT DATE: 03/22/2020  INDICATIONS: Bacteremia  PROCEDURE:   Informed consent was obtained prior to the procedure. The risks, benefits and alternatives for the procedure were discussed and the patient comprehended these risks.  Risks include, but are not limited to, cough, sore throat, vomiting, nausea, somnolence, esophageal and stomach trauma or perforation, bleeding, low blood pressure, aspiration, pneumonia, infection, trauma to the teeth and death.    Monitored anesthesia care provided under the supervision of Dr. Marcie Bal. Patient was administered 239 mg of propofol and 20 mg of lidocaine.   The transesophageal probe was inserted in the esophagus and stomach without difficulty and multiple views were obtained.   COMPLICATIONS:    There were no immediate complications.  FINDINGS:  LEFT VENTRICLE: EF = 25-30% with global hypokinesis  RIGHT VENTRICLE: Normal size and function.   LEFT ATRIUM: No thrombus/mass.  LEFT ATRIAL APPENDAGE: No thrombus/mass.   RIGHT ATRIUM: No thrombus/mass.  AORTIC VALVE:  Trileaflet. Mild regurgitation. No vegetation.  MITRAL VALVE:    Normal structure. Trivial regurgitation. No vegetation.  TRICUSPID VALVE: Normal structure. Trivial regurgitation. No vegetation.  PULMONIC VALVE: Grossly normal structure. Trivial regurgitation. No apparent vegetation.  INTERATRIAL SEPTUM: There is a very small PFO seen by color Doppler.  PERICARDIUM: Small effusion noted.  DESCENDING AORTA: Mild diffuse plaque seen   CONCLUSION: no evidence of endocarditis   Buford Dresser, MD, PhD [facility]  [address] [place], [place] [address] [phone]   11:48 AM

## 2020-03-28 NOTE — Progress Notes (Signed)
Patient ID: Valerie [name], female   DOB: 1945-08-30, 74 y.o.   MRN: [id number]         [facility]  Date of Admission:  03/22/2020           Day 6 vancomycin ASSESSMENT:  She improved promptly on therapy for MRSA bacteremia.  Repeat blood cultures are negative and there is no evidence of endocarditis by exam or TEE.  PLAN: 1. Recommend discharge home in the morning on linezolid 600 mg 2 times daily for 2 more weeks 2. I have arranged follow-up in my clinic on 04/19/2020 3. I will sign off now  Principal Problem:   MRSA bacteremia Active Problems:   AMS (altered mental status)   Primary cancer of right upper lobe of lung (HCC)   Malignant neoplasm of brain ([place])   History of DVT of lower extremity   Congestive heart failure (CHF) (HCC)   Elevated troponin   Dilated cardiomyopathy (HCC)   Hypokalemia   Prolonged Q-T interval on ECG   DNR (do not resuscitate)   Normocytic anemia   Urinary tract infection without hematuria   SVT (supraventricular tachycardia) (HCC)   Scheduled Meds: . atorvastatin  80 mg Oral QHS  . clopidogrel  75 mg Oral Q breakfast  . erlotinib  150 mg Oral Daily  . metoprolol succinate  25 mg Oral Daily  . potassium chloride  40 mEq Oral Daily  . rivaroxaban  20 mg Oral QPM  . vitamin B-12  1,000 mcg Oral QPM   Continuous Infusions: . vancomycin 750 mg (03/28/20 1420)   PRN Meds:.acetaminophen, guaiFENesin-dextromethorphan, LORazepam, polyethylene glycol   SUBJECTIVE: She is feeling better.  She tells me that she has her "head together" today.  Review of Systems: Review of Systems  Constitutional: Negative for chills, diaphoresis and fever.  Respiratory: Negative for cough and shortness of breath.   Cardiovascular: Negative for chest pain.  Gastrointestinal: Negative for abdominal pain, diarrhea, nausea and vomiting.  Genitourinary: Negative for dysuria.    Allergies  Allergen Reactions  . Keflex  [Cephalexin] Itching and Rash    Throat swelling- Patient has tolerated Rocephin & Cefdinir since this reaction    OBJECTIVE: Vitals:   03/28/20 1215 03/28/20 1225 03/28/20 1324 03/28/20 1342  BP: (!) 106/59 110/63 108/70   Pulse: (!) 102 97 (!) 101 100  Resp: (!) 29 (!) 25 (!) 24 (!) 22  Temp:   97.7 F (36.5 C)   TempSrc:   Oral   SpO2: 95% 96% 93%   Weight:      Height:       Body mass index is 24.09 kg/m.  Physical Exam Constitutional:      Comments: She is very pleasant and in no distress.  Cardiovascular:     Rate and Rhythm: Normal rate and regular rhythm.     Heart sounds: No murmur heard.   Pulmonary:     Effort: Pulmonary effort is normal.     Breath sounds: Normal breath sounds.  Abdominal:     Palpations: Abdomen is soft.     Tenderness: There is no abdominal tenderness.  Musculoskeletal:        General: No swelling or tenderness.     Comments: She has a healed incision over her right knee.  Skin:    Findings: No rash.  Psychiatric:        Mood and Affect: Mood normal.     Lab Results Lab Results  Component Value Date  WBC 7.7 03/28/2020   HGB 11.3 (L) 03/28/2020   HCT 37.5 03/28/2020   MCV 95.9 03/28/2020   PLT 306 03/28/2020    Lab Results  Component Value Date   CREATININE 0.89 03/28/2020   BUN 12 03/28/2020   NA 132 (L) 03/28/2020   K 4.0 03/28/2020   CL 101 03/28/2020   CO2 22 03/28/2020    Lab Results  Component Value Date   ALT 22 03/22/2020   AST 29 03/22/2020   ALKPHOS 82 03/22/2020   BILITOT 1.2 03/22/2020     Microbiology: Recent Results (from the past 240 hour(s))  SARS Coronavirus 2 by RT PCR (hospital order, performed in [place] hospital lab) Nasopharyngeal Nasopharyngeal Swab     Status: None   Collection Time: 03/22/20 10:57 PM   Specimen: Nasopharyngeal Swab  Result Value Ref Range Status   SARS Coronavirus 2 NEGATIVE NEGATIVE Final    Comment: (NOTE) SARS-CoV-2 target nucleic acids are NOT DETECTED.  The  SARS-CoV-2 RNA is generally detectable in upper and lower respiratory specimens during the acute phase of infection. The lowest concentration of SARS-CoV-2 viral copies this assay can detect is 250 copies / mL. A negative result does not preclude SARS-CoV-2 infection and should not be used as the sole basis for treatment or other patient management decisions.  A negative result may occur with improper specimen collection / handling, submission of specimen other than nasopharyngeal swab, presence of viral mutation(s) within the areas targeted by this assay, and inadequate number of viral copies (<250 copies / mL). A negative result must be combined with clinical observations, patient history, and epidemiological information.  Fact Sheet for Patients:   StrictlyIdeas.no  Fact Sheet for Healthcare Providers: BankingDealers.co.za  This test is not yet approved or  cleared by the [place] FDA and has been authorized for detection and/or diagnosis of SARS-CoV-2 by FDA under an Emergency Use Authorization (EUA).  This EUA will remain in effect (meaning this test can be used) for the duration of the COVID-19 declaration under Section 564(b)(1) of the Act, 21 U.S.C. section 360bbb-3(b)(1), unless the authorization is terminated or revoked sooner.  Performed at [facility], [place] [address], [place], [place] [address]   Blood culture (routine single)     Status: Abnormal   Collection Time: 03/22/20 10:58 PM   Specimen: BLOOD  Result Value Ref Range Status   Specimen Description   Final    BLOOD LEFT WRIST Performed at [place] [address], [place], [place] [address]    Special Requests   Final    BOTTLES DRAWN AEROBIC AND ANAEROBIC Blood Culture adequate volume Performed at [place] [address], [place], [place] [address]    Culture  Setup Time   Final    IN BOTH  AEROBIC AND ANAEROBIC BOTTLES GRAM POSITIVE COCCI Organism ID to follow CRITICAL RESULT CALLED TO, READ BACK BY AND VERIFIED WITHCristopher Estimable [facility] 03/23/20 2009 JDW Performed at [facility], [place] [address] [place], [place] [address]    Culture METHICILLIN RESISTANT STAPHYLOCOCCUS AUREUS (A)  Final   Report Status 03/25/2020 FINAL  Final   Organism ID, Bacteria METHICILLIN RESISTANT STAPHYLOCOCCUS AUREUS  Final      Susceptibility   Methicillin resistant staphylococcus aureus - MIC*    CIPROFLOXACIN >=8 RESISTANT Resistant     ERYTHROMYCIN >=8 RESISTANT Resistant     GENTAMICIN <=0.5 SENSITIVE Sensitive     OXACILLIN >=4 RESISTANT Resistant  TETRACYCLINE <=1 SENSITIVE Sensitive     VANCOMYCIN 1 SENSITIVE Sensitive     TRIMETH/SULFA <=10 SENSITIVE Sensitive     CLINDAMYCIN >=8 RESISTANT Resistant     RIFAMPIN <=0.5 SENSITIVE Sensitive     Inducible Clindamycin NEGATIVE Sensitive     * METHICILLIN RESISTANT STAPHYLOCOCCUS AUREUS  Blood Culture ID Panel (Reflexed)     Status: Abnormal   Collection Time: 03/22/20 10:58 PM  Result Value Ref Range Status   Enterococcus faecalis NOT DETECTED NOT DETECTED Final   Enterococcus Faecium NOT DETECTED NOT DETECTED Final   Listeria monocytogenes NOT DETECTED NOT DETECTED Final   Staphylococcus species DETECTED (A) NOT DETECTED Final    Comment: CRITICAL RESULT CALLED TO, READ BACK BY AND VERIFIED WITH: E WILLIAMSON PHARMD 03/23/20 2009 JDW    Staphylococcus aureus (BCID) DETECTED (A) NOT DETECTED Final    Comment: Methicillin (oxacillin)-resistant Staphylococcus aureus (MRSA). MRSA is predictably resistant to beta-lactam antibiotics (except ceftaroline). Preferred therapy is vancomycin unless clinically contraindicated. Patient requires contact precautions if  hospitalized. CRITICAL RESULT CALLED TO, READ BACK BY AND VERIFIED WITH: E WILLIAMSON PHARMD 03/23/20 2009 JDW    Staphylococcus epidermidis NOT DETECTED NOT DETECTED Final    Staphylococcus lugdunensis NOT DETECTED NOT DETECTED Final   Streptococcus species NOT DETECTED NOT DETECTED Final   Streptococcus agalactiae NOT DETECTED NOT DETECTED Final   Streptococcus pneumoniae NOT DETECTED NOT DETECTED Final   Streptococcus pyogenes NOT DETECTED NOT DETECTED Final   A.calcoaceticus-baumannii NOT DETECTED NOT DETECTED Final   Bacteroides fragilis NOT DETECTED NOT DETECTED Final   Enterobacterales NOT DETECTED NOT DETECTED Final   Enterobacter cloacae complex NOT DETECTED NOT DETECTED Final   Escherichia coli NOT DETECTED NOT DETECTED Final   Klebsiella aerogenes NOT DETECTED NOT DETECTED Final   Klebsiella oxytoca NOT DETECTED NOT DETECTED Final   Klebsiella pneumoniae NOT DETECTED NOT DETECTED Final   Proteus species NOT DETECTED NOT DETECTED Final   Salmonella species NOT DETECTED NOT DETECTED Final   Serratia marcescens NOT DETECTED NOT DETECTED Final   Haemophilus influenzae NOT DETECTED NOT DETECTED Final   Neisseria meningitidis NOT DETECTED NOT DETECTED Final   Pseudomonas aeruginosa NOT DETECTED NOT DETECTED Final   Stenotrophomonas maltophilia NOT DETECTED NOT DETECTED Final   Candida albicans NOT DETECTED NOT DETECTED Final   Candida auris NOT DETECTED NOT DETECTED Final   Candida glabrata NOT DETECTED NOT DETECTED Final   Candida krusei NOT DETECTED NOT DETECTED Final   Candida parapsilosis NOT DETECTED NOT DETECTED Final   Candida tropicalis NOT DETECTED NOT DETECTED Final   Cryptococcus neoformans/gattii NOT DETECTED NOT DETECTED Final   Meth resistant mecA/C and MREJ DETECTED (A) NOT DETECTED Final    Comment: CRITICAL RESULT CALLED TO, READ BACK BY AND VERIFIED WITHCristopher Estimable [facility] 03/23/20 2009 JDW Performed at [facility], [address], [place], [place] [address]   Urine culture     Status: Abnormal   Collection Time: 03/23/20  2:22 AM   Specimen: In/Out Cath Urine  Result Value Ref Range Status   Specimen Description   Final     IN/OUT CATH URINE Performed at [place] [address], [place], [place] [address]    Special Requests   Final    NONE Performed at [facility], [place] [address], [place], [place] [address]    Culture (A)  Final    <10,000 COLONIES/mL INSIGNIFICANT GROWTH Performed at [place] [address] [place], [place] [address]  Report Status 03/23/2020 FINAL  Final  Culture, blood (Routine X 2) w Reflex to ID Panel     Status: None (Preliminary result)   Collection Time: 03/23/20 11:14 PM   Specimen: BLOOD RIGHT HAND  Result Value Ref Range Status   Specimen Description   Final    BLOOD RIGHT HAND Performed at [facility], [place] [address], [place], [place] [address]    Special Requests   Final    BOTTLES DRAWN AEROBIC ONLY Blood Culture adequate volume Performed at [place] [address] [place], [place] [address]    Culture   Final    NO GROWTH 4 DAYS Performed at [facility], [place] [address], [place], [place] [address]    Report Status PENDING  Incomplete  Culture, blood (Routine X 2) w Reflex to ID Panel     Status: None (Preliminary result)   Collection Time: 03/23/20 11:14 PM   Specimen: BLOOD RIGHT WRIST  Result Value Ref Range Status   Specimen Description   Final    BLOOD RIGHT WRIST Performed at [facility], [place] [address], [place], [place] [address]    Special Requests   Final    BOTTLES DRAWN AEROBIC ONLY Blood Culture adequate volume Performed at [place] [address] [place], [place] [address]    Culture   Final    NO GROWTH 4 DAYS Performed at [facility], [place] [address], [place], [place] [address]    Report Status PENDING  Incomplete    Michel Bickers, MD [facility] [place] [facility] [phone] pager   [phone] cell 03/28/2020, 4:05 PM

## 2020-03-28 NOTE — Progress Notes (Signed)
PROGRESS NOTE    Valerie [name]  WIO:[id number] DOB: 22-Apr-1946 DOA: 03/22/2020 PCP: [name], MD    Chief Complaint  Patient presents with  . Altered Mental Status  . Fatigue    Brief Narrative:  [name] is a 74 y.o. 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  History is obtained from husband phone and daughter at bedside.  Patient was unaware of events surrounding her altered mental status.  Reportedly patient was talking on the phone with her sister today when she fell asleep and dropped her phone.  They later realized she was less responsive and decided to bring her to the ED.  She did receive Benadryl about an hour prior for some generalized itching.  She was diagnosed with UTI with symptoms of dysuria about 3 days ago and has been taking Cipro.  Otherwise no fever.  No nausea, vomiting or worsening diarrhea.Pt continues to have dysuria and notes some lower abdominal pain today.  In the ED, she was afebrile, had mild leukocytosis of 10.8, stable mild normocytic anemia with hemoglobin of 11.4.  EKG had prolonged QTC of 570 and new inverted T waves in V1 to V2 compared to prior on my review.  ED PA discussed these findings with cardiology fellow Dr. Kalman Shan who was concerned about possible elevated intracranial pressure and CT head was obtained and normal.  Differential diagnosis remains broad per cardiology but he has high threshold for any further ischemic evaluation.   Troponin of 67.  Magnesium of 2.  Potassium of 2.8.  Assessment & Plan:   Principal Problem:   MRSA bacteremia Active Problems:   Primary cancer of right upper lobe of lung ([place])   Malignant neoplasm of brain ([place])   History of DVT of lower  extremity   Congestive heart failure (CHF) (HCC)   Elevated troponin   Dilated cardiomyopathy (HCC)   AMS (altered mental status)   Hypokalemia   Prolonged Q-T interval on ECG   DNR (do not resuscitate)   Normocytic anemia   Urinary tract infection without hematuria   SVT (supraventricular tachycardia) ([place])  1 MRSA bacteremia Questionable etiology.  Patient admitted with altered mental status/acute encephalopathy felt initially secondary to UTI as patient with history of brain mets.  Patient pancultured and 1 set of blood cultures initially drawn on admission was positive for MRSA.  Repeat blood cultures x2 obtained prior to initiation of IV vancomycin have remained negative to date..  2D echo with EF of 25 to 30%, global hypokinesis, grade 1 diastolic dysfunction, no vegetations seen. Unchanged from prior 2D echo. ID recommending TEE.  Cardiology consulted and patient for TEE today Monday, 03/28/2020.   IV Rocephin has been discontinued. Continue IV vancomycin.  Per ID patient is resistant to the fact of a PICC line and ID recommending probable oral Zyvox on discharge.  ID following and I appreciate the input and recommendations.   2.  Acute metabolic encephalopathy Likely secondary to problem #1 and probable UTI in the setting of brain mets and possibly secondary to fluoroquinolone.  Urine cultures with insignificant growth.  Clinical improvement.  Likely close to baseline.  Continue IV vancomycin.  IV Rocephin has been discontinued.  Supportive care.  3.  Hypokalemia .  Potassium at 4.  Continue oral daily potassium  supplementation.   4.  Prolonged QT Repeat EKG on 03/26/2020 with resolution of QTC prolongation.  Keep potassium > 4.  Keep magnesium > 2.  Continue Toprol-XL.  Follow.  5.  History of DVT Continue Xarelto.    6.  Elevated troponin Likely secondary to a demand ischemia.  Cardiology, Dr. Kalman Shan evaluated EKG changes and feels low suspicion for ischemic disease.  Patient  asymptomatic.  Repeat troponins trended down.  2D echo with EF of 25 to 30%, global hypokinesis, grade 1 diastolic dysfunction, no vegetations seen. Unchanged from prior 2D echo. Follow.  7.  Chronic combined CHF/dilated cardiomyopathy Stable.  Euvolemic.  Continue Lipitor, Plavix, Toprol-XL.  Outpatient follow-up with cardiology.    8.  History of non-small cell lung cancer with mets to the brain and neck CT head negative for any acute findings.  Patient on Tarceva prior to admission.  Patient currently with MRSA bacteremia and ideally would like to keep patient off Tarceva.  Per RN, family insistent and looks like family have contacted the cancer center and per notes in chart Dr. Earlie Server recommending patient be continued on Tarceva. Case discussed with Dr. Earlie Server who stated that if patient improves clinically could likely resume Tarceva, as it is an inhibitor of tyrosine kinase of EGFR and patient should do fine with it in spite of ongoing infection. Oncology assessed the patient the morning of 03/25/2020.  Tarceva resumed and per oncology patient may take the own supply which husband brought in 03/27/2020.   9. Bacteriuria Patient prior to admission was being treated for UTI with ciprofloxacin.  Urinalysis done on admission nitrite positive negative leukocytes 0-5 WBCs.  Urine cultures with insignificant growth.  Patient was on IV Rocephin which has subsequently been discontinued.  10.  SVT Patient noted to have a brief episode of SVT lasting 5 minutes around 1620 hrs on 03/26/2020. while attempting to ambulate to chair with heart rate going up as high as the 140s to the 160s however remained asymptomatic per RN.  Heart rate improved to the 90s to low 100s.  Potassium repleted.  EKG with normal sinus rhythm.  Toprol-XL resumed.  No further episodes of SVT.  Follow.    DVT prophylaxis: Xarelto Code Status: DNR Family Communication: Updated patient.  No family at bedside.   Disposition:   Status  is: Inpatient    Dispo: The patient is from: Home              Anticipated d/c is to: Likely home.  To be determined.              Anticipated d/c date is: Hopefully 1 to 2 days.              Patient currently with MRSA bacteremia, on IV antibiotics, history of metastatic lung cancer on chemotherapy.  Patient for TEE today Monday, 03/28/2020. Not stable for discharge.       Consultants:   ID: Dr. Megan Salon 03/24/2020  Curb sided/spoke with Dr. Curt Bears oncology  Procedures:   2D echo 03/24/2020  CT head 03/22/2020  Chest x-ray 11/07/2019  TEE pending for 03/28/2020  Antimicrobials:   IV vancomycin 03/23/2020>>>>  IV Rocephin 03/23/2020>>>> 03/24/2020   Subjective: Patient in bed.  Denies chest pain or shortness of breath.  Feeling better.  Tolerating diet.  Has been n.p.o. since midnight in anticipation of TEE today.   Objective: Vitals:   03/27/20 0603 03/27/20 1439 03/27/20 2119 03/28/20 0308  BP: 106/68 118/60 122/67 127/79  Pulse:  84 92 75 87  Resp: 20 (!) 21 (!) 22 (!) 22  Temp: 98.4 F (36.9 C) 98.2 F (36.8 C) 98.5 F (36.9 C) 98.4 F (36.9 C)  TempSrc: Oral Oral Oral Oral  SpO2: 97% 96% 95% 96%  Weight:      Height:       No intake or output data in the 24 hours ending 03/28/20 0832 Filed Weights   03/22/20 2248 03/23/20 2230  Weight: 73.9 kg 69.8 kg    Examination:  General exam: NAD Respiratory system: Lungs clear to auscultation bilaterally anterior lung fields.  No wheezes, no crackles, no rhonchi.  Normal respiratory effort.  Speaking in full sentences.   Cardiovascular system: RRR no murmurs rubs or gallops.  No JVD.  No lower extremity edema.   Gastrointestinal system: Abdomen is soft, nontender, nondistended, positive bowel sounds.  No rebound.  No guarding. Central nervous system: Alert and oriented. No focal neurological deficits. Extremities: Symmetric 5 x 5 power. Skin: Band-Aid noted around left breast where patient stated had a mole  removed. Psychiatry: Judgement and insight appear normal. Mood & affect appropriate.     Data Reviewed: I have personally reviewed following labs and imaging studies  CBC: Recent Labs  Lab 03/22/20 2257 03/23/20 0446 03/24/20 0431 03/25/20 0425 03/26/20 0502 03/27/20 0500 03/28/20 0351  WBC 10.8*   < > 6.4 5.5 5.4 6.3 7.7  NEUTROABS 9.4*  --  4.3 3.5 3.2  --   --   HGB 11.4*   < > 10.4* 9.9* 10.7* 10.7* 11.3*  HCT 36.8   < > 34.2* 32.1* 33.3* 33.9* 37.5  MCV 94.8   < > 94.0 93.9 91.7 94.2 95.9  PLT 275   < > 253 269 273 300 306   < > = values in this interval not displayed.    Basic Metabolic Panel: Recent Labs  Lab 03/22/20 2329 03/23/20 0446 03/24/20 0431 03/25/20 0425 03/26/20 0502 03/27/20 0351 03/28/20 0351  NA  --    < > 136 139 141 137 132*  K  --    < > 3.6 3.5 3.6 3.9 4.0  CL  --    < > 107 109 110 108 101  CO2  --    < > 23 22 21* 20* 22  GLUCOSE  --    < > 100* 90 96 97 90  BUN  --    < > 10 8 5* 7* 12  CREATININE  --    < > 0.74 0.78 0.64 0.82 0.89  CALCIUM  --    < > 8.6* 8.8* 8.9 9.1 9.0  MG 2.0  --  1.9 1.8 2.5* 2.2  --    < > = values in this interval not displayed.    GFR: Estimated Creatinine Clearance: 53.9 mL/min (by C-G formula based on SCr of 0.89 mg/dL).  Liver Function Tests: Recent Labs  Lab 03/22/20 2257  AST 29  ALT 22  ALKPHOS 82  BILITOT 1.2  PROT 6.0*  ALBUMIN 3.6    CBG: No results for input(s): GLUCAP in the last 168 hours.   Recent Results (from the past 240 hour(s))  SARS Coronavirus 2 by RT PCR (hospital order, performed in [facility] hospital lab) Nasopharyngeal Nasopharyngeal Swab     Status: None   Collection Time: 03/22/20 10:57 PM   Specimen: Nasopharyngeal Swab  Result Value Ref Range Status   SARS Coronavirus 2 NEGATIVE NEGATIVE Final    Comment: (NOTE) SARS-CoV-2 target  nucleic acids are NOT DETECTED.  The SARS-CoV-2 RNA is generally detectable in upper and lower respiratory specimens during the  acute phase of infection. The lowest concentration of SARS-CoV-2 viral copies this assay can detect is 250 copies / mL. A negative result does not preclude SARS-CoV-2 infection and should not be used as the sole basis for treatment or other patient management decisions.  A negative result may occur with improper specimen collection / handling, submission of specimen other than nasopharyngeal swab, presence of viral mutation(s) within the areas targeted by this assay, and inadequate number of viral copies (<250 copies / mL). A negative result must be combined with clinical observations, patient history, and epidemiological information.  Fact Sheet for Patients:   StrictlyIdeas.no  Fact Sheet for Healthcare Providers: BankingDealers.co.za  This test is not yet approved or  cleared by the [place] FDA and has been authorized for detection and/or diagnosis of SARS-CoV-2 by FDA under an Emergency Use Authorization (EUA).  This EUA will remain in effect (meaning this test can be used) for the duration of the COVID-19 declaration under Section 564(b)(1) of the Act, 21 U.S.C. section 360bbb-3(b)(1), unless the authorization is terminated or revoked sooner.  Performed at [facility], [place] [address] [place], [place] [address]   Blood culture (routine single)     Status: Abnormal   Collection Time: 03/22/20 10:58 PM   Specimen: BLOOD  Result Value Ref Range Status   Specimen Description   Final    BLOOD LEFT WRIST Performed at [place] [address] [place], [place] [address]    Special Requests   Final    BOTTLES DRAWN AEROBIC AND ANAEROBIC Blood Culture adequate volume Performed at [facility] [address], [place],  [address]    Culture  Setup Time   Final    IN BOTH AEROBIC AND ANAEROBIC BOTTLES GRAM POSITIVE COCCI Organism ID to follow CRITICAL RESULT  CALLED TO, READ BACK BY AND VERIFIED WITHCristopher Estimable [facility] 03/23/20 2009 JDW Performed at [facility], [place] [address], [place],  [address]    Culture METHICILLIN RESISTANT STAPHYLOCOCCUS AUREUS (A)  Final   Report Status 03/25/2020 FINAL  Final   Organism ID, Bacteria METHICILLIN RESISTANT STAPHYLOCOCCUS AUREUS  Final      Susceptibility   Methicillin resistant staphylococcus aureus - MIC*    CIPROFLOXACIN >=8 RESISTANT Resistant     ERYTHROMYCIN >=8 RESISTANT Resistant     GENTAMICIN <=0.5 SENSITIVE Sensitive     OXACILLIN >=4 RESISTANT Resistant     TETRACYCLINE <=1 SENSITIVE Sensitive     VANCOMYCIN 1 SENSITIVE Sensitive     TRIMETH/SULFA <=10 SENSITIVE Sensitive     CLINDAMYCIN >=8 RESISTANT Resistant     RIFAMPIN <=0.5 SENSITIVE Sensitive     Inducible Clindamycin NEGATIVE Sensitive     * METHICILLIN RESISTANT STAPHYLOCOCCUS AUREUS  Blood Culture ID Panel (Reflexed)     Status: Abnormal   Collection Time: 03/22/20 10:58 PM  Result Value Ref Range Status   Enterococcus faecalis NOT DETECTED NOT DETECTED Final   Enterococcus Faecium NOT DETECTED NOT DETECTED Final   Listeria monocytogenes NOT DETECTED NOT DETECTED Final   Staphylococcus species DETECTED (A) NOT DETECTED Final    Comment: CRITICAL RESULT CALLED TO, READ BACK BY AND VERIFIED WITH: E WILLIAMSON PHARMD 03/23/20 2009 JDW    Staphylococcus aureus (BCID) DETECTED (A) NOT DETECTED Final    Comment: Methicillin (oxacillin)-resistant Staphylococcus aureus (MRSA). MRSA is predictably resistant  to beta-lactam antibiotics (except ceftaroline). Preferred therapy is vancomycin unless clinically contraindicated. Patient requires contact precautions if  hospitalized. CRITICAL RESULT CALLED TO, READ BACK BY AND VERIFIED WITH: E WILLIAMSON PHARMD 03/23/20 2009 JDW    Staphylococcus epidermidis NOT DETECTED NOT DETECTED Final   Staphylococcus lugdunensis NOT DETECTED NOT DETECTED Final   Streptococcus species NOT  DETECTED NOT DETECTED Final   Streptococcus agalactiae NOT DETECTED NOT DETECTED Final   Streptococcus pneumoniae NOT DETECTED NOT DETECTED Final   Streptococcus pyogenes NOT DETECTED NOT DETECTED Final   A.calcoaceticus-baumannii NOT DETECTED NOT DETECTED Final   Bacteroides fragilis NOT DETECTED NOT DETECTED Final   Enterobacterales NOT DETECTED NOT DETECTED Final   Enterobacter cloacae complex NOT DETECTED NOT DETECTED Final   Escherichia coli NOT DETECTED NOT DETECTED Final   Klebsiella aerogenes NOT DETECTED NOT DETECTED Final   Klebsiella oxytoca NOT DETECTED NOT DETECTED Final   Klebsiella pneumoniae NOT DETECTED NOT DETECTED Final   Proteus species NOT DETECTED NOT DETECTED Final   Salmonella species NOT DETECTED NOT DETECTED Final   Serratia marcescens NOT DETECTED NOT DETECTED Final   Haemophilus influenzae NOT DETECTED NOT DETECTED Final   Neisseria meningitidis NOT DETECTED NOT DETECTED Final   Pseudomonas aeruginosa NOT DETECTED NOT DETECTED Final   Stenotrophomonas maltophilia NOT DETECTED NOT DETECTED Final   Candida albicans NOT DETECTED NOT DETECTED Final   Candida auris NOT DETECTED NOT DETECTED Final   Candida glabrata NOT DETECTED NOT DETECTED Final   Candida krusei NOT DETECTED NOT DETECTED Final   Candida parapsilosis NOT DETECTED NOT DETECTED Final   Candida tropicalis NOT DETECTED NOT DETECTED Final   Cryptococcus neoformans/gattii NOT DETECTED NOT DETECTED Final   Meth resistant mecA/C and MREJ DETECTED (A) NOT DETECTED Final    Comment: CRITICAL RESULT CALLED TO, READ BACK BY AND VERIFIED WITHCristopher Estimable [facility] 03/23/20 2009 JDW Performed at [facility], [address], [place], [place] [address]   Urine culture     Status: Abnormal   Collection Time: 03/23/20  2:22 AM   Specimen: In/Out Cath Urine  Result Value Ref Range Status   Specimen Description   Final    IN/OUT CATH URINE Performed at [facility] [address],  [place], [place] [address]    Special Requests   Final    NONE Performed at [facility], [place] [address], [place], [place] [address]    Culture (A)  Final    <10,000 COLONIES/mL INSIGNIFICANT GROWTH Performed at [place] [address] [place], [place] [address]    Report Status 03/23/2020 FINAL  Final  Culture, blood (Routine X 2) w Reflex to ID Panel     Status: None (Preliminary result)   Collection Time: 03/23/20 11:14 PM   Specimen: BLOOD RIGHT HAND  Result Value Ref Range Status   Specimen Description   Final    BLOOD RIGHT HAND Performed at [facility], [place] [address], [place], [place] [address]    Special Requests   Final    BOTTLES DRAWN AEROBIC ONLY Blood Culture adequate volume Performed at [place] [address], [place], [place] [address]    Culture   Final    NO GROWTH 3 DAYS Performed at [facility], [place] [address] [place], [place] [address]    Report Status PENDING  Incomplete  Culture, blood (Routine X 2) w Reflex to ID Panel     Status: None (Preliminary  result)   Collection Time: 03/23/20 11:14 PM   Specimen: BLOOD RIGHT WRIST  Result Value Ref Range Status   Specimen Description   Final    BLOOD RIGHT WRIST Performed at [facility], [address], [place], [place] [address]    Special Requests   Final    BOTTLES DRAWN AEROBIC ONLY Blood Culture adequate volume Performed at [facility] [address] [place], [place] [address]    Culture   Final    NO GROWTH 3 DAYS Performed at [facility], [place] [address] [place], [place] [address]    Report Status PENDING  Incomplete         Radiology Studies: No results found.      Scheduled Meds: . atorvastatin  80 mg Oral QHS  . clopidogrel  75 mg Oral Q breakfast  . erlotinib  150 mg Oral Daily  . metoprolol succinate  25 mg Oral Daily  . potassium chloride  40 mEq Oral Daily  . rivaroxaban  20  mg Oral QPM  . vitamin B-12  1,000 mcg Oral QPM   Continuous Infusions: . sodium chloride 20 mL/hr at 03/28/20 0759  . vancomycin 750 mg (03/27/20 2346)     LOS: 4 days    Time spent: 35 minutes    Irine Seal, MD [facility]   To contact the attending provider between 7A-7P or the covering provider during after hours 7P-7A, please log into the web site [email or web address] and access using universal Alligator password for that web site. If you do not have the password, please call the hospital operator.  03/28/2020, 8:32 AM

## 2020-03-28 NOTE — Transfer of Care (Signed)
Immediate Anesthesia Transfer of Care Note  Patient: Valerie [name]  Procedure(s) Performed: TRANSESOPHAGEAL ECHOCARDIOGRAM (TEE) (N/A )  Patient Location: Endoscopy Unit  Anesthesia Type:MAC  Level of Consciousness: sedated  Airway & Oxygen Therapy: Patient connected to nasal cannula oxygen  Post-op Assessment: Post -op Vital signs reviewed and stable  Post vital signs: stable  Last Vitals:  Vitals Value Taken Time  BP    Temp    Pulse    Resp    SpO2      Last Pain:  Vitals:   03/28/20 1023  TempSrc: Temporal  PainSc: 0-No pain      Patients Stated Pain Goal: 3 (16/10/96 0454)  Complications: No complications documented.

## 2020-03-28 NOTE — Progress Notes (Signed)
  Echocardiogram Echocardiogram Transesophageal has been performed.  Valerie [name] 03/28/2020, 11:58 AM

## 2020-03-28 NOTE — Anesthesia Procedure Notes (Signed)
Date/Time: 03/28/2020 11:37 AM Performed by: Lavell Luster, CRNA Pre-anesthesia Checklist: Patient identified, Emergency Drugs available, Suction available, Patient being monitored and Timeout performed Patient Re-evaluated:Patient Re-evaluated prior to induction Oxygen Delivery Method: Nasal cannula Preoxygenation: Pre-oxygenation with 100% oxygen Induction Type: IV induction Placement Confirmation: breath sounds checked- equal and bilateral and positive ETCO2 Dental Injury: Teeth and Oropharynx as per pre-operative assessment

## 2020-03-28 NOTE — Care Management Important Message (Signed)
Important Message  Patient Details IM Letter given to the Patient Name: Valerie [name] MRN: [id number] Date of Birth: 11-Oct-1945   Medicare Important Message Given:  Yes     Kerin Salen 03/28/2020, 10:22 AM

## 2020-03-28 NOTE — Interval H&P Note (Signed)
History and Physical Interval Note:  03/28/2020 11:08 AM  Valerie [name]  has presented today for surgery, with the diagnosis of Bactremia.  The various methods of treatment have been discussed with the patient and family. After consideration of risks, benefits and other options for treatment, the patient has consented to  Procedure(s): TRANSESOPHAGEAL ECHOCARDIOGRAM (TEE) (N/A) as a surgical intervention.  The patient's history has been reviewed, patient examined, no change in status, stable for surgery.  I have reviewed the patient's chart and labs.  Questions were answered to the patient's satisfaction.    We did discuss temporary suspension of DNR for procedure, and she is amenable to this.   Eion Timbrook Harrell Gave

## 2020-03-28 NOTE — Anesthesia Preprocedure Evaluation (Signed)
Anesthesia Evaluation  Patient identified by MRN, date of birth, ID band Patient awake    Reviewed: Allergy & Precautions, H&P , NPO status , Patient's Chart, lab work & pertinent test results  Airway Mallampati: II   Neck ROM: full    Dental   Pulmonary shortness of breath,  H/o lung CA   breath sounds clear to auscultation       Cardiovascular hypertension, + CAD and +CHF   Rhythm:regular Rate:Normal     Neuro/Psych  Headaches, PSYCHIATRIC DISORDERS Anxiety Dementia    GI/Hepatic   Endo/Other    Renal/GU      Musculoskeletal  (+) Arthritis ,   Abdominal   Peds  Hematology  (+) Blood dyscrasia, anemia ,   Anesthesia Other Findings   Reproductive/Obstetrics                             Anesthesia Physical Anesthesia Plan  ASA: III  Anesthesia Plan: MAC   Post-op Pain Management:    Induction: Intravenous  PONV Risk Score and Plan: 2 and Propofol infusion and Treatment may vary due to age or medical condition  Airway Management Planned: Nasal Cannula  Additional Equipment:   Intra-op Plan:   Post-operative Plan:   Informed Consent: I have reviewed the patients History and Physical, chart, labs and discussed the procedure including the risks, benefits and alternatives for the proposed anesthesia with the patient or authorized representative who has indicated his/her understanding and acceptance.       Plan Discussed with: CRNA, Anesthesiologist and Surgeon  Anesthesia Plan Comments:         Anesthesia Quick Evaluation

## 2020-03-29 ENCOUNTER — Encounter (HOSPITAL_COMMUNITY): Payer: Self-pay | Admitting: Cardiology

## 2020-03-29 ENCOUNTER — Telehealth: Payer: Self-pay

## 2020-03-29 LAB — CBC
HCT: 36.3 % (ref 36.0–46.0)
Hemoglobin: 11.1 g/dL — ABNORMAL LOW (ref 12.0–15.0)
MCH: 29.1 pg (ref 26.0–34.0)
MCHC: 30.6 g/dL (ref 30.0–36.0)
MCV: 95 fL (ref 80.0–100.0)
Platelets: 274 10*3/uL (ref 150–400)
RBC: 3.82 MIL/uL — ABNORMAL LOW (ref 3.87–5.11)
RDW: 16.2 % — ABNORMAL HIGH (ref 11.5–15.5)
WBC: 10.6 10*3/uL — ABNORMAL HIGH (ref 4.0–10.5)
nRBC: 0 % (ref 0.0–0.2)

## 2020-03-29 LAB — CULTURE, BLOOD (ROUTINE X 2)
Culture: NO GROWTH
Culture: NO GROWTH
Special Requests: ADEQUATE
Special Requests: ADEQUATE

## 2020-03-29 LAB — BASIC METABOLIC PANEL
Anion gap: 9 (ref 5–15)
BUN: 11 mg/dL (ref 8–23)
CO2: 20 mmol/L — ABNORMAL LOW (ref 22–32)
Calcium: 9.1 mg/dL (ref 8.9–10.3)
Chloride: 107 mmol/L (ref 98–111)
Creatinine, Ser: 0.85 mg/dL (ref 0.44–1.00)
GFR calc Af Amer: >60 mL/min (ref >60)
GFR calc non Af Amer: >60 mL/min (ref >60)
Glucose, Bld: 106 mg/dL — ABNORMAL HIGH (ref 70–99)
Potassium: 3.8 mmol/L (ref 3.5–5.1)
Sodium: 136 mmol/L (ref 135–145)

## 2020-03-29 MED ORDER — LINEZOLID 600 MG PO TABS: 600.0000 mg | ORAL_TABLET | Freq: Two times a day (BID) | ORAL | 0 refills | Status: AC

## 2020-03-29 NOTE — TOC Transition Note (Addendum)
Transition of Care [facility]) - CM/SW Discharge Note   Patient Details  Name: Valerie [name] MRN: [id number] Date of Birth: February 02, 1946  [facility] CM/SW Contact:  Ross Ludwig, LCSW Phone Number: 03/29/2020, 12:17 PM   Clinical Narrative:     Patient will be going home, patient's husband is not interested in home health.  CSW signing off please reconsult with any other social work needs.  Patient is followed by [facility] they are aware of planned discharge.  Patient's husband is aware of planned of discharge today and is requesting EMS transport.  CSW contacted EMS, and patient will be discharging today.    Final next level of care: Home/Self Care Barriers to Discharge: Barriers Resolved   Patient Goals and CMS Choice Patient states their goals for this hospitalization and ongoing recovery are:: To return back home. CMS Medicare.gov Compare Post Acute Care list provided to:: Patient Represenative (must comment) Choice offered to / list presented to : Spouse  Discharge Placement                       Discharge Plan and Services                  DME Agency: NA       HH Arranged: NA          Social Determinants of Health (SDOH) Interventions     Readmission Risk Interventions Readmission Risk Prevention Plan 02/18/2020  Transportation Screening Complete  PCP or Specialist Appt within 5-7 Days Complete  Home Care Screening Patient refused  Medication Review (RN CM) Complete  Some recent data might be hidden

## 2020-03-29 NOTE — Telephone Encounter (Signed)
[name] at [facility] remote was notified of patient discharge. She has been unable to come into office for appointments.requested that they draw blood (she needs to have bloodwork drawn in 1 week per d/c summary)  [name] stated that she would notify the patients primary nurse.

## 2020-03-29 NOTE — Discharge Summary (Signed)
Physician Discharge Summary  Valerie [name] JKD:[id number] DOB: [birth year] DOA: 03/22/2020  PCP: [name], MD  Admit date: 03/22/2020 Discharge date: 03/29/2020  Time spent: 60 minutes  Recommendations for Outpatient Follow-up:  1. Follow-up with Dr. Megan Salon, ID 8/ 31 /2021. 2. Follow-up with Dr. Curt Bears as previously scheduled. 3. Follow-up with [name], MD in 2 weeks or as previously scheduled.  On follow-up patient will need a basic metabolic profile done to follow-up on electrolytes and renal function as well as a CBC done.   Discharge Diagnoses:  Principal Problem:   MRSA bacteremia Active Problems:   Primary cancer of right upper lobe of lung ([place])   Malignant neoplasm of brain ([place])   History of DVT of lower extremity   Congestive heart failure (CHF) (HCC)   Elevated troponin   Dilated cardiomyopathy (H[facility])   AMS (altered mental status)   Hypokalemia   Prolonged Q-T interval on ECG   DNR (do not resuscitate)   Normocytic anemia   Urinary tract infection without hematuria   SVT (supraventricular tachycardia) ([place])   Discharge Condition: Stable and improved  Diet recommendation: Heart healthy  Filed Weights   03/22/20 2248 03/23/20 2230  Weight: 73.9 kg 69.8 kg    History of present illness:  Valerie [name] per [name] is a 74 y.o. 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  History was obtained from husband phone and daughter at bedside.  Patient was unaware of events surrounding Valerie altered mental status.  Reportedly patient was talking on the phone with Valerie [name] today when she fell asleep and dropped Valerie phone.  They later realized she was less responsive and decided to bring Valerie to the ED.   She did receive Benadryl about an hour prior for some generalized itching.  She was diagnosed with UTI with symptoms of dysuria about 3 days ago and has been taking Cipro.  Otherwise no fever.  No nausea, vomiting or worsening diarrhea.Pt continues to have dysuria and notes some lower abdominal pain today.  In the ED, she was afebrile, had mild leukocytosis of 10.8, stable mild normocytic anemia with hemoglobin of 11.4.  EKG had prolonged QTC of 570 and new inverted T waves in V1 to V2 compared to prior on my review.  ED PA discussed these findings with cardiology fellow Dr. Kalman Shan who was concerned about possible elevated intracranial pressure and CT head was obtained and normal.  Differential diagnosis remains broad per cardiology but he has high threshold for any further ischemic evaluation.   Troponin of 67.  Magnesium of 2.  Potassium of 2.8.  Hospital Course:  1 MRSA bacteremia Questionable etiology.  Patient admitted with altered mental status/acute encephalopathy felt initially secondary to UTI as patient with history of brain mets.  Patient pancultured and 1 set of blood cultures initially drawn on admission was positive for MRSA.  Repeat blood cultures x2 obtained prior to initiation of IV vancomycin have remained negative to date..  2D echo with EF of 25 to 30%, global hypokinesis, grade 1 diastolic dysfunction, no vegetations seen. Unchanged from prior 2D echo. ID recommending TEE.  Cardiology consulted and patient underwent TEE (03/28/2020) which was negative for endocarditis..   IV Rocephin discontinued.  Patient maintained on IV vancomycin throughout the hospitalization. Per ID patient was resistant  to placement of a PICC line and ID recommended discharge home on linezolid 600 mg 2 times daily x2 more weeks.  Patient remained afebrile.  Improved clinically.  Patient be discharged home in stable and improved condition and will follow up with ID on 04/19/2020.   2.  Acute metabolic  encephalopathy Likely secondary to problem #1 and probable UTI in the setting of brain mets and possibly secondary to fluoroquinolone.  Urine cultures with insignificant growth.  Clinical improvement.  Likely close to baseline.  Patient maintained on IV antibiotics initially on vancomycin and Rocephin.  IV Rocephin subsequently discontinued.  Patient maintained on IV vancomycin.  Patient will be discharged home on Zyvox.  Outpatient follow-up.  3.  Hypokalemia .    Repleted.  Patient maintained on home regimen of oral daily potassium supplementation.  Outpatient follow-up.   4.  Prolonged QT Repeat EKG on 03/26/2020 with resolution of QTC prolongation.  Patient maintained on home regimen of Toprol-XL.  Electrolytes repleted.  Outpatient follow-up.  5.  History of DVT Patient maintained on home regimen of Xarelto.    6.  Elevated troponin Likely secondary to a demand ischemia.  Cardiology, Dr. Kalman Shan evaluated EKG changes and feels low suspicion for ischemic disease.  Patient asymptomatic.  Repeat troponins trended down.  2D echo with EF of 25 to 30%, global hypokinesis, grade 1 diastolic dysfunction, no vegetations seen. Unchanged from prior 2D echo.   7.  Chronic combined CHF/dilated cardiomyopathy Stable.  Euvolemic.  Patient maintained on home regimen of Lipitor, Plavix, Toprol-XL.  Outpatient follow-up with cardiology.    8.  History of non-small cell lung cancer with mets to the brain and neck CT head negative for any acute findings.  Patient on Tarceva prior to admission.  Patient currently with MRSA bacteremia and ideally would like to keep patient off Tarceva.  Per RN, family insistent and looks like family have contacted the cancer center and per notes in chart Dr. Earlie Server recommending patient be continued on Tarceva. Case discussed with Dr. Earlie Server who stated that if patient improves clinically could likely resume Tarceva, as it is an inhibitor of tyrosine kinase of EGFR and patient  should do fine with it in spite of ongoing infection. Oncology assessed the patient the morning of 03/25/2020.  Tarceva resumed and per oncology patient may take the own supply which husband brought in 03/27/2020.  Outpatient follow-up with oncology.    9. Bacteriuria Patient prior to admission was being treated for UTI with ciprofloxacin.  Urinalysis done on admission nitrite positive negative leukocytes 0-5 WBCs.  Urine cultures with insignificant growth.  Patient was on IV Rocephin was subsequently discontinued.  No further antibiotics needed.  10.  SVT Patient noted to have a brief episode of SVT lasting 5 minutes around 1620 hrs on 03/26/2020. while attempting to ambulate to chair with heart rate going up as high as the 140s to the 160s however remained asymptomatic per RN.  Heart rate improved to the 90s to low 100s.  Potassium repleted.  EKG with normal sinus rhythm.  Toprol-XL resumed.  No further episodes of SVT.   Outpatient follow-up.   Procedures:  2D echo 03/24/2020  CT head 03/22/2020  Chest x-ray 11/07/2019  TEE 03/28/2020  Consultations:  ID: Dr. Megan Salon 03/24/2020  Curb sided/spoke with Dr. Curt Bears oncology  Discharge Exam: Vitals:   03/28/20 2213 03/29/20 0418  BP: 101/61 116/66  Pulse: (!) 102 100  Resp: 20 20  Temp: 99.6 F (37.6 C) 97.9 F (  36.6 C)  SpO2: 97% 95%    General: NAD Cardiovascular: RRR Respiratory: CTAB anterior lung fields.  Discharge Instructions   Discharge Instructions    Diet - low sodium heart healthy   Complete by: As directed    Discharge wound care:   Complete by: As directed    As per during hospitalization   Increase activity slowly   Complete by: As directed      Allergies as of 03/29/2020      Reactions   Keflex [cephalexin] Itching, Rash   Throat swelling- Patient has tolerated Rocephin & Cefdinir since this reaction      Medication List    TAKE these medications   ammonium lactate 12 % lotion Commonly known  as: LAC-HYDRIN Apply topically 2 (two) times daily.   atorvastatin 80 MG tablet Commonly known as: LIPITOR Take 1 tablet (80 mg total) by mouth at bedtime.   clopidogrel 75 MG tablet Commonly known as: PLAVIX Take 1 tablet (75 mg total) by mouth daily with breakfast.   erlotinib 150 MG tablet Commonly known as: TARCEVA Take 1 tablet (150 mg total) by mouth daily. Take on an empty stomach 1 hour before meals or 2 hours after.   linezolid 600 MG tablet Commonly known as: ZYVOX Take 1 tablet (600 mg total) by mouth 2 (two) times daily for 14 days. Please continue taking until 04/12/2020   MAGNESIUM PO Take 1 tablet by mouth daily.   metoprolol succinate 25 MG 24 hr tablet Commonly known as: TOPROL-XL Take 1 tablet (25 mg total) by mouth daily.   mometasone 0.1 % cream Commonly known as: ELOCON Apply 1 application topically 2 (two) times daily.   Multi-Vitamins Tabs Take 1 tablet by mouth every evening.   polyethylene glycol 17 g packet Commonly known as: MIRALAX / GLYCOLAX Take 17 g by mouth daily. What changed:   when to take this  reasons to take this   potassium chloride SA 20 MEQ tablet Commonly known as: KLOR-CON Take 2 tablets (40 mEq total) by mouth daily.   saccharomyces boulardii 250 MG capsule Commonly known as: Florastor Take 1 capsule (250 mg total) by mouth 2 (two) times daily.   vitamin B-12 1000 MCG tablet Commonly known as: CYANOCOBALAMIN Take 1,000 mcg by mouth every evening.   vitamin C 100 MG tablet Take 100 mg by mouth every evening. Gummie   Xarelto 20 MG Tabs tablet Generic drug: rivaroxaban Take 1 tablet (20 mg total) by mouth every evening.            Discharge Care Instructions  (From admission, onward)         Start     Ordered   03/29/20 0000  Discharge wound care:       Comments: As per during hospitalization   03/29/20 1021         Allergies  Allergen Reactions  . Keflex [Cephalexin] Itching and Rash    Throat  swelling- Patient has tolerated Rocephin & Cefdinir since this reaction    Follow-up Information    [name], MD. Schedule an appointment as soon as possible for a visit in 2 week(s).   Specialty: Internal Medicine Why: f/u in 2 - 3  weeks or as scheduled. Contact information: [address] [place] [address] [phone]        Buford Dresser, MD .   Specialty: Cardiology Contact information: [address] [place] [address] [phone]  Curt Bears, MD Follow up.   Specialty: Oncology Why: f/u as scheduled Contact information: [place] [address] [phone]        Michel Bickers, MD Follow up on 04/19/2020.   Specialty: Infectious Diseases Why: f/u at 1045am Contact information: [address] [place] [address] [phone]                The results of significant diagnostics from this hospitalization (including imaging, microbiology, ancillary and laboratory) are listed below for reference.    Significant Diagnostic Studies: CT Head Wo Contrast  Result Date: 03/22/2020 CLINICAL DATA:  Altered mental status EXAM: CT HEAD WITHOUT CONTRAST TECHNIQUE: Contiguous axial images were obtained from the base of the skull through the vertex without intravenous contrast. COMPARISON:  CT brain 06/11/2019, 09/13/2018 FINDINGS: Brain: No acute territorial infarction, hemorrhage or intracranial mass. Atrophy and white matter hypodensity consistent with chronic small vessel ischemic change. Stable ventricle size. Gyriform calcifications at the left greater than right occipital lobes, slightly increased. Vascular: No hyperdense vessels.  Scattered carotid calcification Skull: Left posterior craniotomy.  No acute fracture Sinuses/Orbits: No acute finding. Other: None IMPRESSION: 1. No CT evidence for acute intracranial abnormality. 2. Atrophy and chronic small vessel ischemic  changes of the white matter. Previous left posterior craniotomy with left greater than right occipital gyral calcifications. Electronically Signed   By: Donavan Foil M.D.   On: 03/22/2020 23:51   DG Chest Port 1 View  Result Date: 03/22/2020 CLINICAL DATA:  Altered mental status with foul-smelling urine. EXAM: PORTABLE CHEST 1 VIEW COMPARISON:  None. FINDINGS: The study is limited secondary to patient rotation. Mild atelectasis is seen within the left lung base. A small left pleural effusion is also noted. Radiopaque surgical clips are seen overlying the medial aspect of the upper left lung. The right lung is clear. No pneumothorax is identified. Marked severity elevation of the left hemidiaphragm is seen. As a result, a large gastric air bubble is seen within the retrocardiac region. The heart size and mediastinal contours are within normal limits. Multiple chronic left-sided rib fractures are noted. IMPRESSION: 1. Marked severity elevation of the left hemidiaphragm with mild left basilar atelectasis. 2. Small left pleural effusion. 3. Postsurgical changes overlying the upper left lung. Electronically Signed   By: Virgina Norfolk M.D.   On: 03/22/2020 23:07   ECHOCARDIOGRAM COMPLETE  Result Date: 03/24/2020    ECHOCARDIOGRAM REPORT   Patient Name:   DENECIA BRUNETTE [facility] Date of Exam: 03/24/2020 Medical Rec #:  [id number]           Height:       67.0 in Accession #:    [id number]          Weight:       153.8 lb Date of Birth:  12-22-1945          BSA:          1.809 m Patient Age:    72 years            BP:           138/69 mmHg Patient Gender: F                   HR:           66 bpm. Exam Location:  Inpatient Procedure: 2D Echo, Cardiac Doppler and Color Doppler Indications:    Bacteremia 790.7 / R78.81  History:  Patient has prior history of Echocardiogram examinations, most                 recent 02/15/2020. Signs/Symptoms:Dyspnea and Murmur; Risk                 Factors:Hypertension. Cancer.   Sonographer:    Jonelle Sidle Dance Referring Phys: [place]  1. Left ventricular ejection fraction, by estimation, is 25 to 30%. The left ventricle has severely decreased function. The left ventricle demonstrates global hypokinesis. Left ventricular diastolic parameters are consistent with Grade I diastolic dysfunction (impaired relaxation).  2. Right ventricular systolic function is normal. The right ventricular size is normal. Tricuspid regurgitation signal is inadequate for assessing PA pressure.  3. Left atrial size was mildly dilated.  4. The mitral valve is normal in structure. Mild mitral valve regurgitation. No evidence of mitral stenosis.  5. The aortic valve is tricuspid. Aortic valve regurgitation is mild. Mild aortic valve sclerosis is present, with no evidence of aortic valve stenosis.  6. The inferior vena cava is normal in size with greater than 50% respiratory variability, suggesting right atrial pressure of 3 mmHg. FINDINGS  Left Ventricle: Left ventricular ejection fraction, by estimation, is 25 to 30%. The left ventricle has severely decreased function. The left ventricle demonstrates global hypokinesis. The left ventricular internal cavity size was normal in size. There is no left ventricular hypertrophy. Left ventricular diastolic parameters are consistent with Grade I diastolic dysfunction (impaired relaxation). Right Ventricle: The right ventricular size is normal.Right ventricular systolic function is normal. Tricuspid regurgitation signal is inadequate for assessing PA pressure. The tricuspid regurgitant velocity is 2.51 m/s, and with an assumed right atrial pressure of 3 mmHg, the estimated right ventricular systolic pressure is 32.4 mmHg. Left Atrium: Left atrial size was mildly dilated. Right Atrium: Right atrial size was normal in size. Pericardium: A small pericardial effusion is present. Mitral Valve: The mitral valve is normal in structure. Normal mobility of the  mitral valve leaflets. Mild mitral annular calcification. Mild mitral valve regurgitation. No evidence of mitral valve stenosis. Tricuspid Valve: The tricuspid valve is normal in structure. Tricuspid valve regurgitation is trivial. No evidence of tricuspid stenosis. Aortic Valve: The aortic valve is tricuspid. Aortic valve regurgitation is mild. Aortic regurgitation PHT measures 341 msec. Mild aortic valve sclerosis is present, with no evidence of aortic valve stenosis. Pulmonic Valve: The pulmonic valve was normal in structure. Pulmonic valve regurgitation is not visualized. No evidence of pulmonic stenosis. Aorta: The aortic root is normal in size and structure. Venous: The inferior vena cava is normal in size with greater than 50% respiratory variability, suggesting right atrial pressure of 3 mmHg.  LEFT VENTRICLE PLAX 2D LVIDd:         4.40 cm  Diastology LVIDs:         3.40 cm  LV e' lateral:   4.79 cm/s LV PW:         0.90 cm  LV E/e' lateral: 14.4 LV IVS:        1.00 cm  LV e' medial:    3.92 cm/s LVOT diam:     1.80 cm  LV E/e' medial:  17.6 LV SV:         58 LV SV Index:   32 LVOT Area:     2.54 cm  RIGHT VENTRICLE             IVC RV Basal diam:  3.20 cm     IVC diam: 2.00  cm RV Mid diam:    1.70 cm RV S prime:     11.10 cm/s TAPSE (M-mode): 1.9 cm LEFT ATRIUM             Index       RIGHT ATRIUM           Index LA diam:        4.10 cm 2.27 cm/m  RA Area:     11.10 cm LA Vol (A2C):   86.7 ml 47.94 ml/m RA Volume:   28.70 ml  15.87 ml/m LA Vol (A4C):   61.5 ml 34.00 ml/m LA Biplane Vol: 77.0 ml 42.57 ml/m  AORTIC VALVE LVOT Vmax:   117.00 cm/s LVOT Vmean:  77.200 cm/s LVOT VTI:    0.229 m AI PHT:      341 msec  AORTA Ao Root diam: 3.50 cm Ao Asc diam:  3.10 cm MITRAL VALVE                TRICUSPID VALVE MV Area (PHT): 3.21 cm     TR Peak grad:   25.2 mmHg MV Decel Time: 236 msec     TR Vmax:        251.00 cm/s MV E velocity: 69.00 cm/s MV A velocity: 108.00 cm/s  SHUNTS MV E/A ratio:  0.64          Systemic VTI:  0.23 m                             Systemic Diam: 1.80 cm Kirk Ruths MD Electronically signed by Kirk Ruths MD Signature Date/Time: 03/24/2020/1:39:31 PM    Final    ECHO TEE  Result Date: 03/28/2020    TRANSESOPHOGEAL ECHO REPORT   Patient Name:   LAIKYN GEWIRTZ [facility] Date of Exam: 03/28/2020 Medical Rec #:  [id number]           Height:       67.0 in Accession #:    [id number]          Weight:       153.8 lb Date of Birth:  12/16/1945          BSA:          1.809 m Patient Age:    46 years            BP:           127/79 mmHg Patient Gender: F                   HR:           108 bpm. Exam Location:  Inpatient Procedure: Transesophageal Echo, Cardiac Doppler and Color Doppler Indications:     Endocarditis  History:         Patient has prior history of Echocardiogram examinations, most                  recent 03/24/2020. CAD, Signs/Symptoms:Murmur and Shortness of                  Breath; Risk Factors:Hypertension and Non-Smoker.  Sonographer:     Vickie Epley RDCS Referring Phys:  [id number] Abigail Butts Diagnosing Phys: Buford Dresser MD PROCEDURE: The transesophogeal probe was passed without difficulty through the esophogus of the patient. Sedation performed by different physician. The patient was monitored while under deep sedation. Anesthestetic sedation was provided intravenously by Anesthesiology: 189m of Propofol, 266mof Lidocaine.  Image quality was good. The patient's vital signs; including heart rate, blood pressure, and oxygen saturation; remained stable throughout the procedure. The patient developed no complications during the procedure. IMPRESSIONS  1. Left ventricular ejection fraction, by estimation, is 25 to 30%. The left ventricle has severely decreased function. The left ventricle demonstrates global hypokinesis.  2. Right ventricular systolic function is normal. The right ventricular size is normal. There is mildly elevated pulmonary artery systolic pressure.  3. Left  atrial size was mild to moderately dilated. No left atrial/left atrial appendage thrombus was detected.  4. The mitral valve is normal in structure. Trivial mitral valve regurgitation.  5. The aortic valve is tricuspid. Aortic valve regurgitation is mild. Mild aortic valve sclerosis is present, with no evidence of aortic valve stenosis.  6. There is mild (Grade II) plaque involving the descending aorta.  7. Evidence of atrial level shunting detected by color flow Doppler. There is a small patent foramen ovale with predominantly left to right shunting across the atrial septum. Conclusion(s)/Recommendation(s): No evidence of vegetation/infective endocarditis on this transesophageal echocardiogram. FINDINGS  Left Ventricle: Left ventricular ejection fraction, by estimation, is 25 to 30%. The left ventricle has severely decreased function. The left ventricle demonstrates global hypokinesis. The left ventricular internal cavity size was normal in size. There is no left ventricular hypertrophy. Right Ventricle: The right ventricular size is normal. No increase in right ventricular wall thickness. Right ventricular systolic function is normal. There is mildly elevated pulmonary artery systolic pressure. The tricuspid regurgitant velocity is 2.59  m/s, and with an assumed right atrial pressure of 10 mmHg, the estimated right ventricular systolic pressure is 34.2 mmHg. Left Atrium: Left atrial size was mild to moderately dilated. No left atrial/left atrial appendage thrombus was detected. Right Atrium: Right atrial size was normal in size. Pericardium: A small pericardial effusion is present. Mitral Valve: The mitral valve is normal in structure. Trivial mitral valve regurgitation. There is no evidence of mitral valve vegetation. Tricuspid Valve: The tricuspid valve is normal in structure. Tricuspid valve regurgitation is trivial. There is no evidence of tricuspid valve vegetation. Aortic Valve: The aortic valve is  tricuspid. Aortic valve regurgitation is mild. Mild aortic valve sclerosis is present, with no evidence of aortic valve stenosis. There is no evidence of aortic valve vegetation. Pulmonic Valve: The pulmonic valve was grossly normal. Pulmonic valve regurgitation is trivial. There is no evidence of pulmonic valve vegetation. Aorta: The aortic root is normal in size and structure. There is mild (Grade II) plaque involving the descending aorta. IAS/Shunts: Evidence of atrial level shunting detected by color flow Doppler. A small patent foramen ovale is detected with predominantly left to right shunting across the atrial septum.  TRICUSPID VALVE TR Peak grad:   26.8 mmHg TR Vmax:        259.00 cm/s Buford Dresser MD Electronically signed by Buford Dresser MD Signature Date/Time: 03/28/2020/2:52:28 PM    Final     Microbiology: Recent Results (from the past 240 hour(s))  SARS Coronavirus 2 by RT PCR (hospital order, performed in [facility] hospital lab) Nasopharyngeal Nasopharyngeal Swab     Status: None   Collection Time: 03/22/20 10:57 PM   Specimen: Nasopharyngeal Swab  Result Value Ref Range Status   SARS Coronavirus 2 NEGATIVE NEGATIVE Final    Comment: (NOTE) SARS-CoV-2 target nucleic acids are NOT DETECTED.  The SARS-CoV-2 RNA is generally detectable in upper and lower respiratory specimens during the acute phase of infection. The lowest concentration of SARS-CoV-2  viral copies this assay can detect is 250 copies / mL. A negative result does not preclude SARS-CoV-2 infection and should not be used as the sole basis for treatment or other patient management decisions.  A negative result may occur with improper specimen collection / handling, submission of specimen other than nasopharyngeal swab, presence of viral mutation(s) within the areas targeted by this assay, and inadequate number of viral copies (<250 copies / mL). A negative result must be combined with  clinical observations, patient history, and epidemiological information.  Fact Sheet for Patients:   StrictlyIdeas.no  Fact Sheet for Healthcare Providers: BankingDealers.co.za  This test is not yet approved or  cleared by the [place] FDA and has been authorized for detection and/or diagnosis of SARS-CoV-2 by FDA under an Emergency Use Authorization (EUA).  This EUA will remain in effect (meaning this test can be used) for the duration of the COVID-19 declaration under Section 564(b)(1) of the Act, 21 U.S.C. section 360bbb-3(b)(1), unless the authorization is terminated or revoked sooner.  Performed at [facility], [place] [address] [place], [place] [address]   Blood culture (routine single)     Status: Abnormal   Collection Time: 03/22/20 10:58 PM   Specimen: BLOOD  Result Value Ref Range Status   Specimen Description   Final    BLOOD LEFT WRIST Performed at [facility] [address], [place], [place] [address]    Special Requests   Final    BOTTLES DRAWN AEROBIC AND ANAEROBIC Blood Culture adequate volume Performed at [facility] [address], [place], [place] [address]    Culture  Setup Time   Final    IN BOTH AEROBIC AND ANAEROBIC BOTTLES GRAM POSITIVE COCCI Organism ID to follow CRITICAL RESULT CALLED TO, READ BACK BY AND VERIFIED WITHCristopher Estimable [facility] 03/23/20 2009 JDW Performed at [facility], [place] [address], [place], [place] [address]    Culture METHICILLIN RESISTANT STAPHYLOCOCCUS AUREUS (A)  Final   Report Status 03/25/2020 FINAL  Final   Organism ID, Bacteria METHICILLIN RESISTANT STAPHYLOCOCCUS AUREUS  Final      Susceptibility   Methicillin resistant staphylococcus aureus - MIC*    CIPROFLOXACIN >=8 RESISTANT Resistant     ERYTHROMYCIN >=8 RESISTANT Resistant     GENTAMICIN <=0.5 SENSITIVE Sensitive     OXACILLIN >=4 RESISTANT Resistant      TETRACYCLINE <=1 SENSITIVE Sensitive     VANCOMYCIN 1 SENSITIVE Sensitive     TRIMETH/SULFA <=10 SENSITIVE Sensitive     CLINDAMYCIN >=8 RESISTANT Resistant     RIFAMPIN <=0.5 SENSITIVE Sensitive     Inducible Clindamycin NEGATIVE Sensitive     * METHICILLIN RESISTANT STAPHYLOCOCCUS AUREUS  Blood Culture ID Panel (Reflexed)     Status: Abnormal   Collection Time: 03/22/20 10:58 PM  Result Value Ref Range Status   Enterococcus faecalis NOT DETECTED NOT DETECTED Final   Enterococcus Faecium NOT DETECTED NOT DETECTED Final   Listeria monocytogenes NOT DETECTED NOT DETECTED Final   Staphylococcus species DETECTED (A) NOT DETECTED Final    Comment: CRITICAL RESULT CALLED TO, READ BACK BY AND VERIFIED WITH: E WILLIAMSON PHARMD 03/23/20 2009 JDW    Staphylococcus aureus (BCID) DETECTED (A) NOT DETECTED Final    Comment: Methicillin (oxacillin)-resistant Staphylococcus aureus (MRSA). MRSA is predictably resistant to beta-lactam antibiotics (except ceftaroline). Preferred therapy is vancomycin unless clinically contraindicated. Patient requires contact precautions if  hospitalized. CRITICAL RESULT CALLED TO, READ BACK BY AND VERIFIED WITH:  Cristopher Estimable [facility] 03/23/20 2009 JDW    Staphylococcus epidermidis NOT DETECTED NOT DETECTED Final   Staphylococcus lugdunensis NOT DETECTED NOT DETECTED Final   Streptococcus species NOT DETECTED NOT DETECTED Final   Streptococcus agalactiae NOT DETECTED NOT DETECTED Final   Streptococcus pneumoniae NOT DETECTED NOT DETECTED Final   Streptococcus pyogenes NOT DETECTED NOT DETECTED Final   A.calcoaceticus-baumannii NOT DETECTED NOT DETECTED Final   Bacteroides fragilis NOT DETECTED NOT DETECTED Final   Enterobacterales NOT DETECTED NOT DETECTED Final   Enterobacter cloacae complex NOT DETECTED NOT DETECTED Final   Escherichia coli NOT DETECTED NOT DETECTED Final   Klebsiella aerogenes NOT DETECTED NOT DETECTED Final   Klebsiella oxytoca NOT DETECTED NOT  DETECTED Final   Klebsiella pneumoniae NOT DETECTED NOT DETECTED Final   Proteus species NOT DETECTED NOT DETECTED Final   Salmonella species NOT DETECTED NOT DETECTED Final   Serratia marcescens NOT DETECTED NOT DETECTED Final   Haemophilus influenzae NOT DETECTED NOT DETECTED Final   Neisseria meningitidis NOT DETECTED NOT DETECTED Final   Pseudomonas aeruginosa NOT DETECTED NOT DETECTED Final   Stenotrophomonas maltophilia NOT DETECTED NOT DETECTED Final   Candida albicans NOT DETECTED NOT DETECTED Final   Candida auris NOT DETECTED NOT DETECTED Final   Candida glabrata NOT DETECTED NOT DETECTED Final   Candida krusei NOT DETECTED NOT DETECTED Final   Candida parapsilosis NOT DETECTED NOT DETECTED Final   Candida tropicalis NOT DETECTED NOT DETECTED Final   Cryptococcus neoformans/gattii NOT DETECTED NOT DETECTED Final   Meth resistant mecA/C and MREJ DETECTED (A) NOT DETECTED Final    Comment: CRITICAL RESULT CALLED TO, READ BACK BY AND VERIFIED WITHCristopher Estimable [facility] 03/23/20 2009 JDW Performed at [facility], [address] [place], [place] [address]   Urine culture     Status: Abnormal   Collection Time: 03/23/20  2:22 AM   Specimen: In/Out Cath Urine  Result Value Ref Range Status   Specimen Description   Final    IN/OUT CATH URINE Performed at [facility] [address], [place], [place] [address]    Special Requests   Final    NONE Performed at [facility], [place] [address], [place], [place] [address]    Culture (A)  Final    <10,000 COLONIES/mL INSIGNIFICANT GROWTH Performed at [facility] [address], [place], [place] [address]    Report Status 03/23/2020 FINAL  Final  Culture, blood (Routine X 2) w Reflex to ID Panel     Status: None (Preliminary result)   Collection Time: 03/23/20 11:14 PM   Specimen: BLOOD RIGHT HAND  Result Value Ref Range Status   Specimen Description   Final    BLOOD RIGHT  HAND Performed at [facility], [place] [address], [place], [place] [address]    Special Requests   Final    BOTTLES DRAWN AEROBIC ONLY Blood Culture adequate volume Performed at [facility] [address], [place],  [address]    Culture   Final    NO GROWTH 4 DAYS Performed at [facility], [place] [address] [place],  [address]    Report Status PENDING  Incomplete  Culture, blood (Routine X 2) w Reflex to ID Panel     Status: None (Preliminary result)   Collection Time: 03/23/20 11:14 PM   Specimen: BLOOD RIGHT WRIST  Result Value Ref Range Status   Specimen Description   Final  BLOOD RIGHT WRIST Performed at [facility], [place] [address], [place], [place] [address]    Special Requests   Final    BOTTLES DRAWN AEROBIC ONLY Blood Culture adequate volume Performed at [facility] [address] [place], [place] [address]    Culture   Final    NO GROWTH 4 DAYS Performed at [facility], [place] [address] [place], [place] [address]    Report Status PENDING  Incomplete     Labs: Basic Metabolic Panel: Recent Labs  Lab 03/22/20 2329 03/23/20 0446 03/24/20 0431 03/24/20 0431 03/25/20 0425 03/26/20 0502 03/27/20 0351 03/28/20 0351 03/29/20 0446  NA  --    < > 136   < > 139 141 137 132* 136  K  --    < > 3.6   < > 3.5 3.6 3.9 4.0 3.8  CL  --    < > 107   < > 109 110 108 101 107  CO2  --    < > 23   < > 22 21* 20* 22 20*  GLUCOSE  --    < > 100*   < > 90 96 97 90 106*  BUN  --    < > 10   < > 8 5* 7* 12 11  CREATININE  --    < > 0.74   < > 0.78 0.64 0.82 0.89 0.85  CALCIUM  --    < > 8.6*   < > 8.8* 8.9 9.1 9.0 9.1  MG 2.0  --  1.9  --  1.8 2.5* 2.2  --   --    < > = values in this interval not displayed.   Liver Function Tests: Recent Labs  Lab 03/22/20 2257  AST 29  ALT 22  ALKPHOS 82  BILITOT 1.2  PROT 6.0*  ALBUMIN 3.6   No results for input(s): LIPASE, AMYLASE in the last 168  hours. No results for input(s): AMMONIA in the last 168 hours. CBC: Recent Labs  Lab 03/22/20 2257 03/23/20 0446 03/24/20 0431 03/24/20 0431 03/25/20 0425 03/26/20 0502 03/27/20 0500 03/28/20 0351 03/29/20 0446  WBC 10.8*   < > 6.4   < > 5.5 5.4 6.3 7.7 10.6*  NEUTROABS 9.4*  --  4.3  --  3.5 3.2  --   --   --   HGB 11.4*   < > 10.4*   < > 9.9* 10.7* 10.7* 11.3* 11.1*  HCT 36.8   < > 34.2*   < > 32.1* 33.3* 33.9* 37.5 36.3  MCV 94.8   < > 94.0   < > 93.9 91.7 94.2 95.9 95.0  PLT 275   < > 253   < > 269 273 300 306 274   < > = values in this interval not displayed.   Cardiac Enzymes: No results for input(s): CKTOTAL, CKMB, CKMBINDEX, TROPONINI in the last 168 hours. BNP: BNP (last 3 results) Recent Labs    02/14/20 1746  BNP 985.5*    ProBNP (last 3 results) No results for input(s): PROBNP in the last 8760 hours.  CBG: No results for input(s): GLUCAP in the last 168 hours.     Signed:  Irine Seal MD.  [facility] 03/29/2020, 10:25 AM

## 2020-03-29 NOTE — Plan of Care (Signed)
  Problem: Clinical Measurements: Goal: Respiratory complications will improve Outcome: Adequate for Discharge   Problem: Clinical Measurements: Goal: Cardiovascular complication will be avoided Outcome: Adequate for Discharge   Problem: Education: Goal: Knowledge of General Education information will improve Description: Including pain rating scale, medication(s)/side effects and non-pharmacologic comfort measures Outcome: Adequate for Discharge

## 2020-03-30 ENCOUNTER — Inpatient Hospital Stay: Payer: Medicare PPO

## 2020-03-30 ENCOUNTER — Ambulatory Visit (HOSPITAL_COMMUNITY): Admission: RE | Admit: 2020-03-30 | Payer: Medicare PPO | Source: Ambulatory Visit

## 2020-03-30 ENCOUNTER — Other Ambulatory Visit: Payer: Medicare PPO

## 2020-03-30 ENCOUNTER — Ambulatory Visit: Payer: Medicare PPO | Admitting: Internal Medicine

## 2020-03-30 ENCOUNTER — Inpatient Hospital Stay: Payer: Medicare PPO | Admitting: Internal Medicine

## 2020-03-30 NOTE — Anesthesia Postprocedure Evaluation (Signed)
Anesthesia Post Note  Patient: Valerie [name]  Procedure(s) Performed: TRANSESOPHAGEAL ECHOCARDIOGRAM (TEE) (N/A )     Patient location during evaluation: Endoscopy Anesthesia Type: MAC Level of consciousness: awake and alert Pain management: pain level controlled Vital Signs Assessment: post-procedure vital signs reviewed and stable Respiratory status: spontaneous breathing, nonlabored ventilation, respiratory function stable and patient connected to nasal cannula oxygen Cardiovascular status: stable and blood pressure returned to baseline Postop Assessment: no apparent nausea or vomiting Anesthetic complications: no   No complications documented.  Last Vitals:  Vitals:   03/29/20 0418 03/29/20 1348  BP: 116/66 (!) 107/58  Pulse: 100 86  Resp: 20 20  Temp: 36.6 C (!) 36.3 C  SpO2: 95% 99%    Last Pain:  Vitals:   03/29/20 1348  TempSrc: Oral  PainSc:                  [place]

## 2020-04-01 ENCOUNTER — Ambulatory Visit (INDEPENDENT_AMBULATORY_CARE_PROVIDER_SITE_OTHER): Payer: Medicare PPO

## 2020-04-01 DIAGNOSIS — C3411 Malignant neoplasm of upper lobe, right bronchus or lung: Secondary | ICD-10-CM | POA: Diagnosis not present

## 2020-04-01 DIAGNOSIS — F0390 Unspecified dementia without behavioral disturbance: Secondary | ICD-10-CM

## 2020-04-01 NOTE — Chronic Care Management (AMB) (Signed)
Chronic Care Management    Social Work Follow Up Note  04/01/2020 Name: Valerie Valerie [name] MRN: [id number] DOB: 11/14/1945  Valerie Valerie [name] is a 74 y.o. year old female who is a primary care Valerie [name] of No primary care provider on file.. Valerie CCM team was consulted for assistance with care coordination.   Review of Valerie [name] status, including review of consultants reports, other relevant assessments, and collaboration with appropriate care team members and Valerie Valerie [name]'s provider was performed as part of comprehensive Valerie [name] evaluation and provision of chronic care management services.    SDOH (Social Determinants of Health) assessments performed: No.    Outpatient Encounter Medications as of 04/01/2020  Medication Sig Note  . ammonium lactate (LAC-HYDRIN) 12 % lotion Apply topically 2 (two) times daily. (Valerie [name] not taking: Reported on 03/23/2020)   . Ascorbic Acid (VITAMIN C) 100 MG tablet Take 100 mg by mouth every evening. Gummie   . atorvastatin (LIPITOR) 80 MG tablet Take 1 tablet (80 mg total) by mouth at bedtime.   . clopidogrel (PLAVIX) 75 MG tablet Take 1 tablet (75 mg total) by mouth daily with breakfast.   . erlotinib (TARCEVA) 150 MG tablet Take 1 tablet (150 mg total) by mouth daily. Take on an empty stomach 1 hour before meals or 2 hours after.   . linezolid (ZYVOX) 600 MG tablet Take 1 tablet (600 mg total) by mouth 2 (two) times daily for 14 days. Please continue taking until 04/12/2020   . MAGNESIUM PO Take 1 tablet by mouth daily.    . metoprolol succinate (TOPROL-XL) 25 MG 24 hr tablet Take 1 tablet (25 mg total) by mouth daily.   . mometasone (ELOCON) 0.1 % cream Apply 1 application topically 2 (two) times daily.   . Multiple Vitamin (MULTI-VITAMINS) TABS Take 1 tablet by mouth every evening.    . polyethylene glycol (MIRALAX / GLYCOLAX) 17 g packet Take 17 g by mouth daily. (Valerie [name] taking differently: Take 17 g by mouth daily as needed for mild constipation. )   .  potassium chloride SA (KLOR-CON) 20 MEQ tablet Take 2 tablets (40 mEq total) by mouth daily.   [name] saccharomyces boulardii (FLORASTOR) 250 MG capsule Take 1 capsule (250 mg total) by mouth 2 (two) times daily.   . vitamin B-12 (CYANOCOBALAMIN) 1000 MCG tablet Take 1,000 mcg by mouth every evening.   [name] 20 MG TABS tablet Take 1 tablet (20 mg total) by mouth every evening. 02/15/2020: PT reported LD of 6/27 ~11AM   No facility-administered encounter medications on file as of 04/01/2020.     Goals Addressed              This Visit's Progress     Valerie [name] Stated   .  COMPLETED: "I want to be placed so I can walk again" (pt-stated)        CARE PLAN ENTRY (see longitudinal plan of care for additional care plan information)  Current Barriers:  . Inability to perform ADL's independently . Inability to perform IADL's independently . Wheelchair bound, has not walked in approximately 1 year . Chronic conditions including Dementia and Malignant Neoplasm of upper lobe of right lung  Social Work Clinical Goal(s):  [name] Over Valerie next 90 days Valerie Valerie [name] will work with SW and primary care provider to determine if Valerie Valerie [name] is eligible for inpatient rehab to meet Valerie patients goal to walk again  CCM SW Interventions: Completed 04/01/20 with Valerie [name] spouse, Valerie Valerie [name] . Successful outbound call to Mr.  Valerie [name] to follow up on Valerie [name] care coordination needs . Determined Valerie Valerie [name] did not go to [facility] as previously planned due to inpatient hospitalization from 8/3 - 8/10 due to Hypokalemia and a MRSA infection o Follow up with [facility] is not planned at this time . Encouraged Valerie Valerie [name] to follow up with embedded care management team as needed to assist with care coordination needs . Scheduled follow up over Valerie next two weeks to assess placement needs  Completed 03/22/20 with Valerie [name] spouse, Valerie Valerie [name] . Successful outbound call to Valerie Valerie [name] to follow up on Valerie [name]  placement needs . Determined Valerie Valerie [name] has an appointment on 8/4 at 10:00 am with [facility] regarding placement to address rehab needs . Advised Valerie Valerie [name] SW will follow up on outcome of appointment to determine if SW assistance is needed . Scheduled follow up call on 03/24/20  Completed 03/15/20 with Valerie Valerie [name] . Inter-disciplinary care team collaboration (see longitudinal plan of care) . Successful outbound call placed to Valerie Valerie [name] to assess for care coordination needs . Determined Valerie Valerie [name] is wanting placement into [facility] in hopes of meeting goal to "walk again" . Assessed for Valerie [name] interest in long-term placement versus acute placement o Valerie Valerie [name] stated "I want to be there until I can walk again" . Advised Valerie Valerie [name] SW would collaborate with her primary care provider regarding Valerie [name] goal for placement . Collaboration with Dr. Baird Cancer to determine Valerie [name] placement needs . Scheduled follow up call to Valerie Valerie [name] over Valerie next two weeks  Valerie [name] Self Care Activities:  . Valerie [name] verbalizes understanding of plan to work with SW and primary care provider to determine placement needs . Calls pharmacy for medication refills . Supportive husband to assist with Valerie [name] care needs . Unable to perform ADLs independently . Unable to perform IADLs independently  Please see past updates related to this goal by clicking on Valerie "Past Updates" button in Valerie selected goal          Follow Up Plan: SW will follow up with Valerie Valerie [name] over Valerie next two weeks  [name], BSW, CDP Social Worker, Certified Dementia Practitioner [place] [facility] [place] [facility] [phone]  Total time spent performing care coordination and/or care management activities with Valerie Valerie [name] by phone or face to face = 13 minutes.

## 2020-04-01 NOTE — Patient Instructions (Signed)
Social Worker Visit Information  Goals we discussed today:  Goals Addressed              This Visit's Progress     Patient Stated   .  "I want to be placed so I can walk again" (pt-stated)        CARE PLAN ENTRY (see longitudinal plan of care for additional care plan information)  Current Barriers:  . Inability to perform ADL's independently . Inability to perform IADL's independently . Wheelchair bound, has not walked in approximately 1 year . Chronic conditions including Dementia and Malignant Neoplasm of upper lobe of right lung  Social Work Clinical Goal(s):  [name] Over the next 90 days the patient will work with SW and primary care provider to determine if the patient is eligible for inpatient rehab to meet the patients goal to walk again  CCM SW Interventions: Completed 04/01/20 with patient spouse, [name] . Successful outbound call to Mr. Aikey to follow up on patient care coordination needs . Determined the patient did not go to [facility] as previously planned due to inpatient hospitalization from 8/3 - 8/10 due to Hypokalemia and a MRSA infection o Follow up with [facility] is not planned at this time . Encouraged Mr. Koc to follow up with embedded care management team as needed to assist with care coordination needs . Scheduled follow up over the next two weeks to assess placement needs  Completed 03/22/20 with patient spouse, Destyne Goodreau . Successful outbound call to Mr. Ruark to follow up on patient placement needs . Determined the patient has an appointment on 8/4 at 10:00 am with [facility] regarding placement to address rehab needs . Advised Mr. Hogan SW will follow up on outcome of appointment to determine if SW assistance is needed . Scheduled follow up call on 03/24/20  Completed 03/15/20 with the patient . Inter-disciplinary care team collaboration (see longitudinal plan of care) . Successful outbound call placed to the patient  to assess for care coordination needs . Determined the patient is wanting placement into [facility] in hopes of meeting goal to "walk again" . Assessed for patient interest in long-term placement versus acute placement o The patient stated "I want to be there until I can walk again" . Advised the patient SW would collaborate with her primary care provider regarding patient goal for placement . Collaboration with Dr. Baird Cancer to determine patient placement needs . Scheduled follow up call to the patient over the next two weeks  Patient Self Care Activities:  . Patient verbalizes understanding of plan to work with SW and primary care provider to determine placement needs . Calls pharmacy for medication refills . Supportive husband to assist with patient care needs . Unable to perform ADLs independently . Unable to perform IADLs independently  Please see past updates related to this goal by clicking on the "Past Updates" button in the selected goal          Follow Up Plan: SW will follow up with patient by phone over the next two weeks   [name], BSW, CDP Social Worker, Certified Dementia Practitioner [place] [facility] [place] [facility] [phone]

## 2020-04-12 ENCOUNTER — Telehealth: Payer: Self-pay

## 2020-04-12 ENCOUNTER — Telehealth: Payer: Medicare PPO

## 2020-04-12 NOTE — Telephone Encounter (Signed)
  Chronic Care Management   Outreach Note  04/12/2020 Name: Valerie [name] MRN: [id number] DOB: 1946-01-11  Referred by: No primary care provider on file. Reason for referral : Care Coordination   An unsuccessful telephone outreach was attempted today. The patient was referred to the case management team for assistance with care management and care coordination.   Follow Up Plan: A HIPPA compliant phone message was left for the patient providing contact information and requesting a return call.  The care management team will reach out to the patient again over the next 10 days.   [name], BSW, CDP Social Worker, Certified Dementia Practitioner [place] [facility] [place] [facility] [phone]

## 2020-04-13 ENCOUNTER — Telehealth: Payer: Self-pay

## 2020-04-13 NOTE — Telephone Encounter (Signed)
The pt wanted to know if she has to come in for her appt or can it be over the phone.  The pt was told that Dr. Baird Cancer said that since [facility] remote goest out to her home that the pt can have a phone visit.

## 2020-04-15 ENCOUNTER — Other Ambulatory Visit: Payer: Self-pay | Admitting: Internal Medicine

## 2020-04-16 DIAGNOSIS — S72401A Unspecified fracture of lower end of right femur, initial encounter for closed fracture: Secondary | ICD-10-CM | POA: Diagnosis not present

## 2020-04-17 IMAGING — CT CT CHEST W/ CM
2 of 5 series · 13 of 36 positions shown, 16 images · IV contrast (APPLIED)
Comparison: Multiple exams, including 10/04/2017 and 04/02/2017

CLINICAL DATA: Metastatic non-small cell lung cancer restaging

EXAM:
CT CHEST, ABDOMEN, AND PELVIS WITH CONTRAST
TECHNIQUE: Multidetector CT imaging of the chest, abdomen and pelvis was
performed following the standard protocol during bolus
administration of intravenous contrast.
CONTRAST:  100mL OMNIPAQUE IOHEXOL 300 MG/ML SOLN, 30mL [id number]
IOPAMIDOL ([id number]) INJECTION 61%

[Series 2: cap with · axial · 0.98mm/px · z∈[-568,+2]mm · 10 of 138 slices shown, 13 images]
[im 12/138  mediastinal]
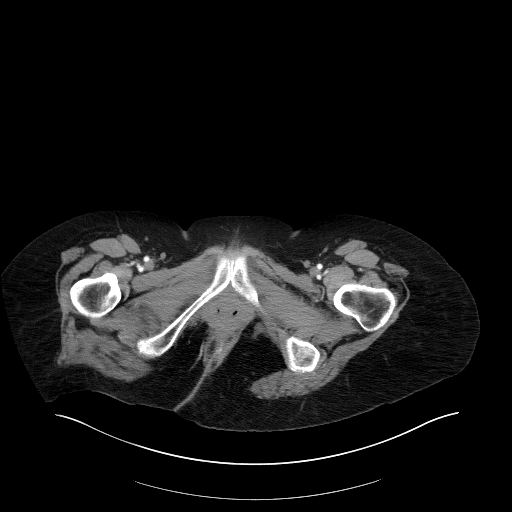
[im 12/138  lung]
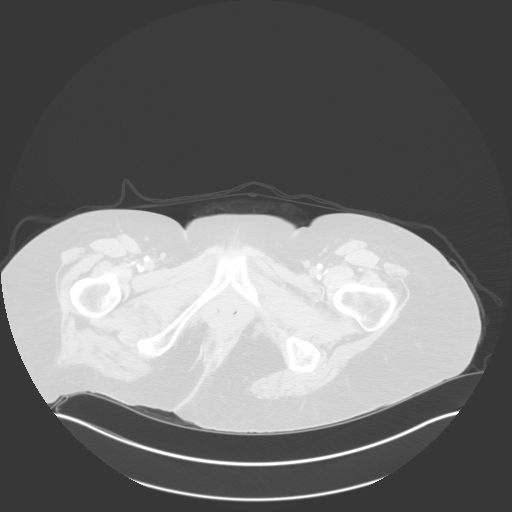
[im 23/138  lung]
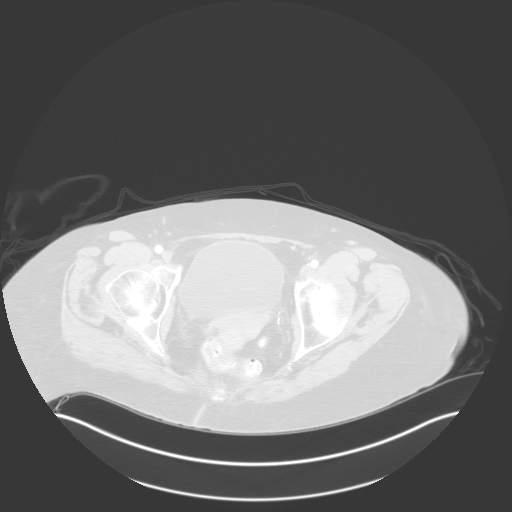
[im 35/138  lung]
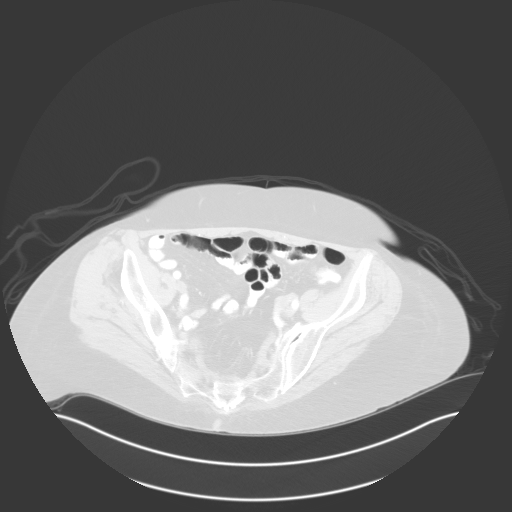
[im 46/138  lung]
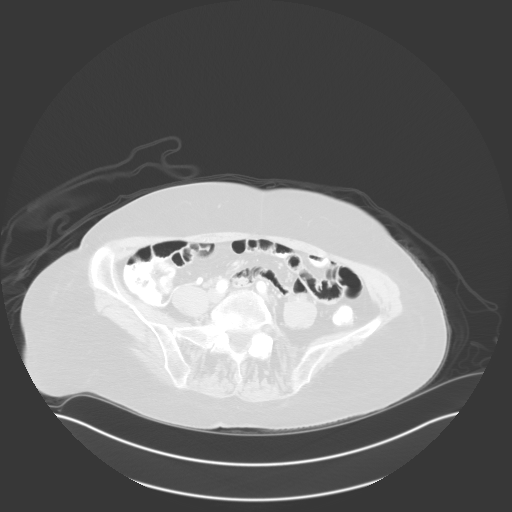
[im 58/138  mediastinal]
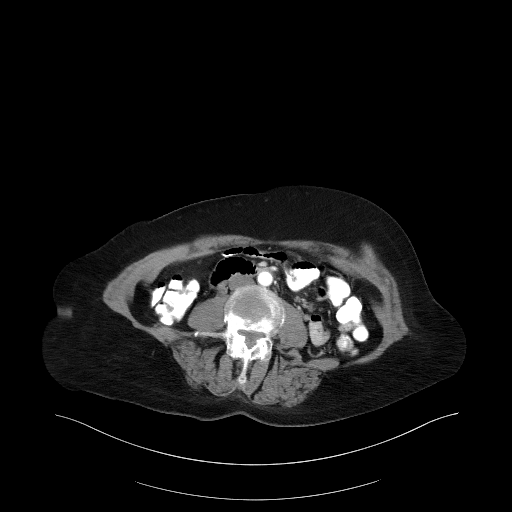
[im 58/138  lung]
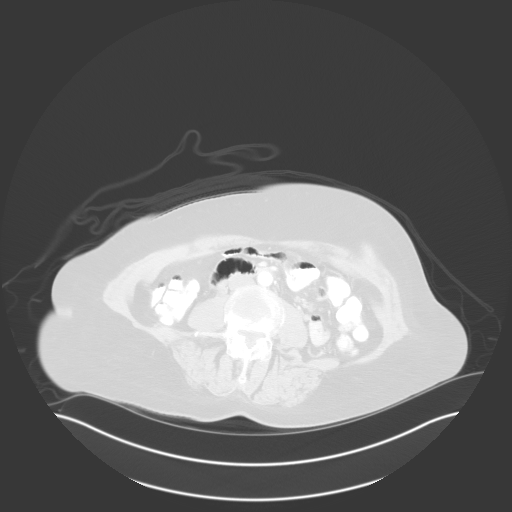
[im 80/138  lung]
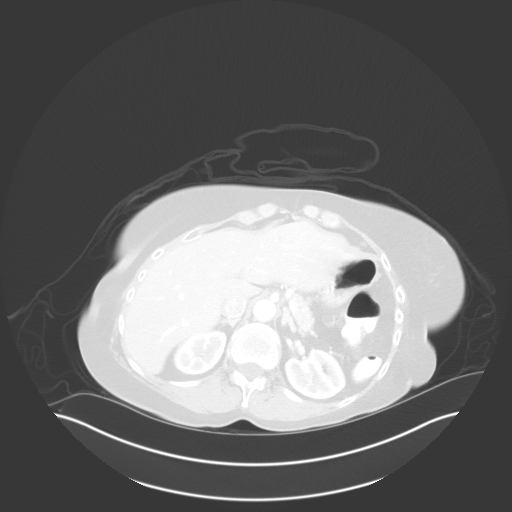
[im 92/138  lung]
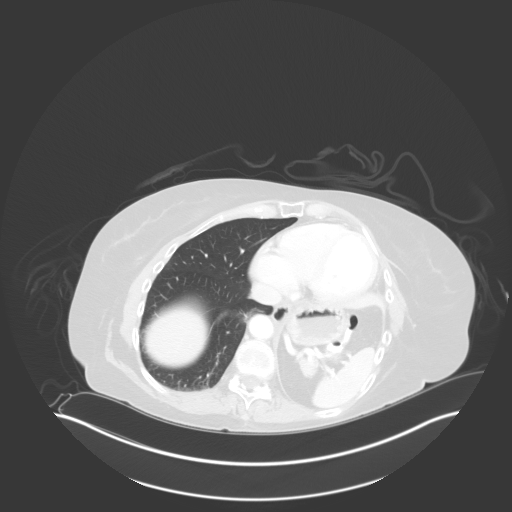
[im 103/138  lung]
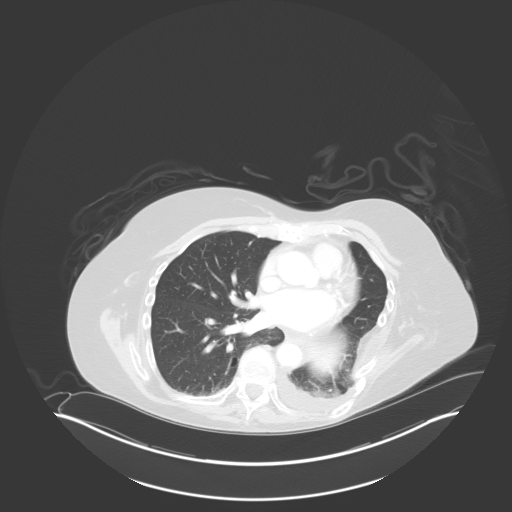
[im 115/138  mediastinal]
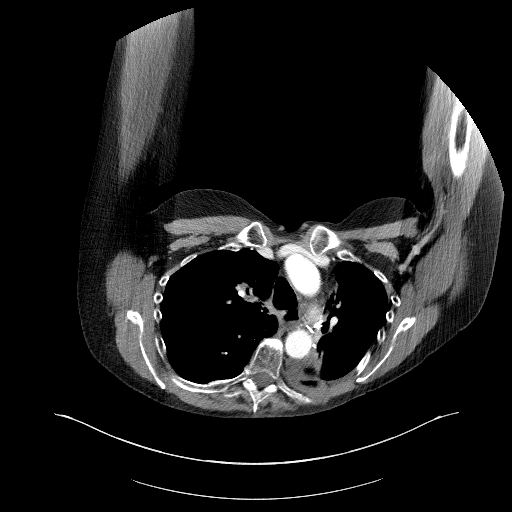
[im 115/138  lung]
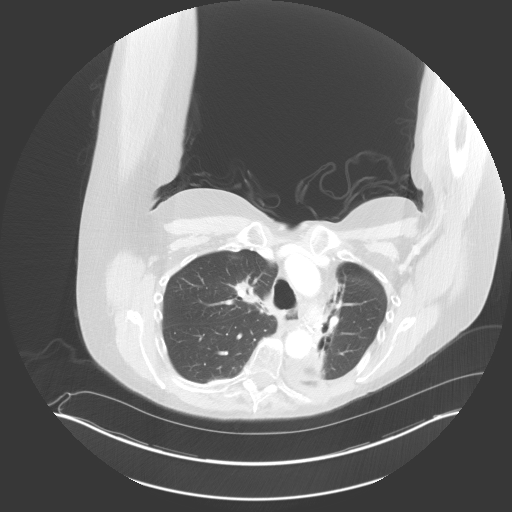
[im 126/138  lung]
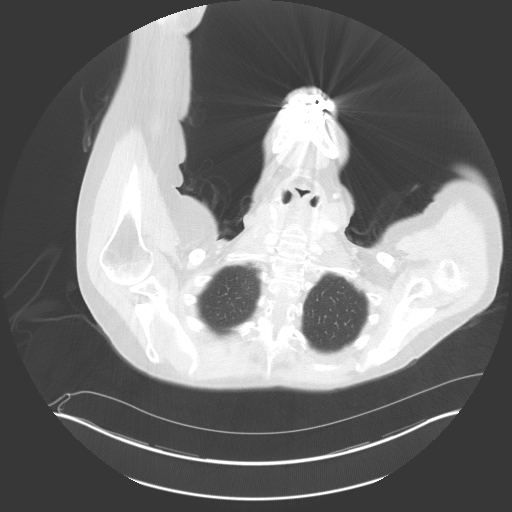

[Series 5: coronals · coronal · 0.82mm/px · 3 of 129 slices shown]
[im 26/129  lung]
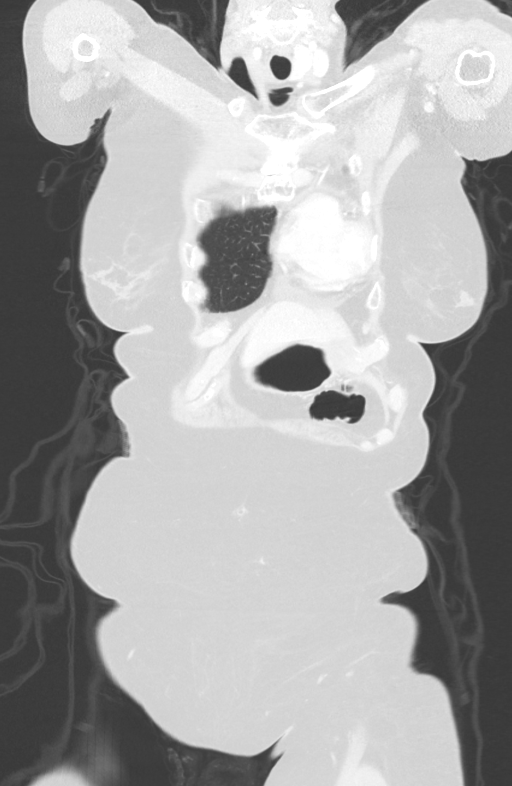
[im 52/129  lung]
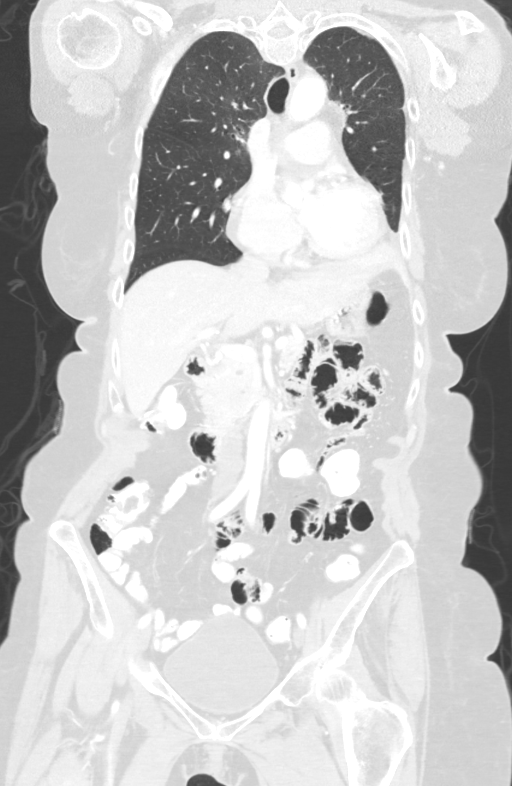
[im 77/129  lung]
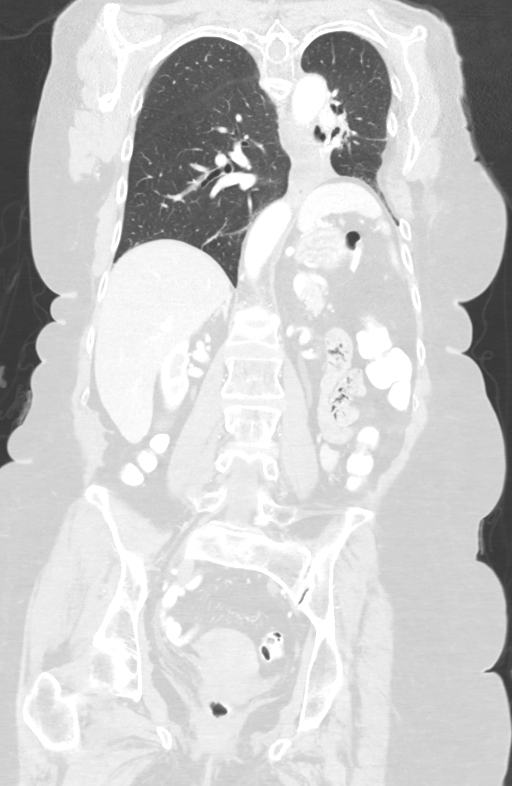

[13 of 36 positions shown; findings below may reference images not displayed]

FINDINGS: CT CHEST FINDINGS

Cardiovascular: Atherosclerotic calcification of the aortic arch.
Mild calcification of the mitral valve. Mild cardiomegaly. Trace
pericardial effusion.

Mediastinum/Nodes: Asymmetric parotid glands as on prior CT of the
neck. Absent right lobe of the thyroid. Mildly dilated esophagus. No
pathologic adenopathy observed.

Lungs/Pleura: Left lower lobectomy with bandlike scarring
posteriorly in the left upper lobe and associated left hemithoracic
volume loss.

Right upper lobe suprahilar volume loss and peribronchovascular
density measuring about 3.0 by 1.7 cm on image 41/7, roughly stable
from 10/04/2017. Stable peripheral 6 by 3 mm right upper lobe nodule
on image 38/7.

Small left pleural effusion.

Musculoskeletal: Stable separation between the left sixth and
seventh ribs, with part of the serratus anterior and inferior
scapula bulging into the resulting thoracic defect. Prominent
thoracic kyphosis. Chronic deformity of the sternum compatible with
an old fracture. Thoracic spondylosis.

CT ABDOMEN PELVIS FINDINGS

Hepatobiliary: A small hypodensity along the dome of the liver near
the diaphragm along segment 7 of the liver, images 50-51 of series
2, is noted. This is stable and may represent a small subcapsular
cyst, measuring approximately 1.1 by 0.5 by 1.4 cm.

Mild wall thickening of the gallbladder with slight irregularity
which may be from hyperplastic cholecystosis.

Pancreas: Mild chronic prominence of the dorsal pancreatic duct in
the pancreatic head. No discrete pancreatic parenchymal lesion is
observed.

Spleen: Unremarkable

Adrenals/Urinary Tract: There is at least partial duplication of the
left collecting system. No scarring or hydronephrosis. Adrenal
glands normal.

Stomach/Bowel: Unremarkable

Vascular/Lymphatic: Aortoiliac atherosclerotic vascular disease.

Reproductive: Posterior uterine body fibroid 3.4 by 2.9 by 2.6 cm.
Adnexa unremarkable.

Other: No supplemental non-categorized findings.

Musculoskeletal: Bony demineralization. Stable compression fractures
at L1 and L4.
IMPRESSION: 1. Stable appearance of the chest, abdomen, and pelvis compared to
10/04/2017, without findings of recurrence or metastatic disease.
2. Small pulmonary nodules are unchanged from prior.
3. Mild cardiomegaly, with trace pericardial effusion trace left
pleural effusion.
4.  Aortic Atherosclerosis ([id number]).
5. Mild chronic prominence of the dorsal pancreatic duct in the
pancreatic head, without a discrete mass.
6. Partial duplication of the left collecting system without
scarring or hydronephrosis.
7. Stable posterior uterine body fibroid.
8. Bony demineralization with chronic compression fractures at L1
and L4.
9. Mild gallbladder wall thickening with slight irregularity which
may be from hyperplastic cholecystosis.
10. Small hypodense lesion posteriorly along segment 7 of the liver,
most likely benign and not changed.
11. Mildly dilated esophagus, query dysmotility.

## 2020-04-18 ENCOUNTER — Telehealth: Payer: Medicare PPO

## 2020-04-18 ENCOUNTER — Telehealth: Payer: Self-pay

## 2020-04-18 ENCOUNTER — Ambulatory Visit: Payer: Self-pay | Admitting: Internal Medicine

## 2020-04-18 ENCOUNTER — Other Ambulatory Visit: Payer: Self-pay

## 2020-04-18 ENCOUNTER — Encounter: Payer: Self-pay | Admitting: Internal Medicine

## 2020-04-18 ENCOUNTER — Telehealth (INDEPENDENT_AMBULATORY_CARE_PROVIDER_SITE_OTHER): Payer: Medicare PPO | Admitting: Internal Medicine

## 2020-04-18 VITALS — BP 120/71 | HR 69 | Resp 16

## 2020-04-18 DIAGNOSIS — F0390 Unspecified dementia without behavioral disturbance: Secondary | ICD-10-CM

## 2020-04-18 DIAGNOSIS — R5381 Other malaise: Secondary | ICD-10-CM

## 2020-04-18 DIAGNOSIS — E876 Hypokalemia: Secondary | ICD-10-CM

## 2020-04-18 DIAGNOSIS — R269 Unspecified abnormalities of gait and mobility: Secondary | ICD-10-CM | POA: Diagnosis not present

## 2020-04-18 DIAGNOSIS — I5042 Chronic combined systolic (congestive) and diastolic (congestive) heart failure: Secondary | ICD-10-CM

## 2020-04-18 NOTE — Patient Instructions (Signed)
Dementia Dementia is a condition that affects the way the brain functions. It often affects memory and thinking. Usually, dementia gets worse with time and cannot be reversed (progressive dementia). There are many types of dementia, including:  Alzheimer's disease. This type is the most common.  Vascular dementia. This type may happen as the result of a stroke.  Lewy body dementia. This type may happen to people who have Parkinson's disease.  Frontotemporal dementia. This type is caused by damage to nerve cells (neurons) in certain parts of the brain. Some people may be affected by more than one type of dementia. This is called mixed dementia. What are the causes? Dementia is caused by damage to cells in the brain. The area of the brain and the types of cells damaged determine the type of dementia. Usually, this damage is irreversible or cannot be undone. Some examples of irreversible causes include:  Conditions that affect the blood vessels of the brain, such as diabetes, heart disease, or blood vessel disease.  Genetic mutations. In some cases, changes in the brain may be caused by another condition and can be reversed or slowed. Some examples of reversible causes include:  Injury to the brain.  Certain medicines.  Infection, such as meningitis.  Metabolic problems, such as vitamin B12 deficiency or thyroid disease.  Pressure on the brain, such as from a tumor or blood clot. What are the signs or symptoms? Symptoms of dementia depend on the type of dementia. Common signs of dementia include problems with remembering, thinking, problem solving, decision making, and communicating. These signs develop slowly or get worse with time. This may include:  Problems remembering things.  Having trouble taking a bath or putting clothes on.  Forgetting appointments.  Forgetting to pay bills.  Difficulty planning and preparing meals.  Having trouble speaking.  Getting lost easily. How  is this diagnosed? This condition is diagnosed by a specialist (neurologist). It is diagnosed based on the history of your symptoms, your medical history, a physical exam, and tests. Tests may include:  Tests to evaluate brain function, such as memory tests, cognitive tests, and other tests.  Lab tests, such as blood or urine tests.  Imaging tests, such as a CT scan, a PET scan, or an MRI.  Genetic testing. This may be done if other family members have a diagnosis of certain types of dementia. Your health care provider will talk with you and your family, friends, or caregivers about your history and symptoms. How is this treated?  Treatment for this condition depends on the cause of the dementia. Progressive dementias, such as Alzheimer's disease, cannot be cured, but there may be treatments that help to manage symptoms. Treatment might involve taking medicines that may help to:  Control the dementia.  Slow down the progression of the dementia.  Manage symptoms. In some cases, treating the cause of your dementia can improve symptoms, reverse symptoms, or slow down how quickly your dementia becomes worse. Your health care provider can direct you to support groups, organizations, and other health care providers who can help with decisions about your care. Follow these instructions at home: Medicines  Take over-the-counter and prescription medicines only as told by your health care provider.  Use a pill organizer or pill reminder to help you manage your medicines.  Avoid taking medicines that can affect thinking, such as pain medicines or sleeping medicines. Lifestyle  Make healthy lifestyle choices. ? Be physically active as told by your health care provider. ? Do  not use any products that contain nicotine or tobacco, such as cigarettes, e-cigarettes, and chewing tobacco. If you need help quitting, ask your health care provider. ? Do not drink alcohol. ? Practice stress-management  techniques when you get stressed. ? Spend time with other people.  Make sure to get quality sleep. These tips can help you get a good night's rest: ? Avoid napping during the day. ? Keep your sleeping area dark and cool. ? Avoid exercising during the few hours before you go to bed. ? Avoid caffeine products in the evening. Eating and drinking  Drink enough fluid to keep your urine pale yellow.  Eat a healthy diet. General instructions   Work with your health care provider to determine what you need help with and what your safety needs are.  Talk with your health care provider about whether it is safe for you to drive.  If you were given a bracelet that identifies you as a person with memory loss or tracks your location, make sure to wear it at all times.  Work with your family to make important decisions, such as advance directives, medical power of attorney, or a living will.  Keep all follow-up visits as told by your health care provider. This is important. Where to find more information  Alzheimer's Association: CapitalMile.co.nz  National Institute on Aging: DVDEnthusiasts.nl  World Health Organization: RoleLink.com.br Contact a health care provider if:  You have any new or worsening symptoms.  You have problems with choking or swallowing. Get help right away if:  You feel depressed or sad, or feel that you want to harm yourself.  Your family members become concerned for your safety. If you ever feel like you may hurt yourself or others, or have thoughts about taking your own life, get help right away. You can go to your nearest emergency department or call:  Your local emergency services (911 in the [place]  A suicide crisis helpline, such as the [place] at 8700327317. This is open 24 hours a day. Summary  Dementia is a condition that affects the way the brain functions. Dementia often affects memory and thinking.  Usually,  dementia gets worse with time and cannot be reversed (progressive dementia).  Treatment for this condition depends on the cause of the dementia.  Work with your health care provider to determine what you need help with and what your safety needs are.  Your health care provider can direct you to support groups, organizations, and other health care providers who can help with decisions about your care. This information is not intended to replace advice given to you by your health care provider. Make sure you discuss any questions you have with your health care provider. Document Revised: 10/21/2018 Document Reviewed: 10/21/2018 Elsevier Patient Education  [place].

## 2020-04-18 NOTE — Telephone Encounter (Signed)
  Chronic Care Management   Outreach Note  04/18/2020 Name: Valerie [name] MRN: [id number] DOB: Dec 08, 1945  Referred by: No primary care provider on file. Reason for referral : Care Coordination   A second unsuccessful telephone outreach was attempted today. The patient was referred to the case management team for assistance with care management and care coordination.   Follow Up Plan: The care management team will reach out to the patient again over the next 10 days.   [name], BSW, CDP Social Worker, Certified Dementia Practitioner [place] [facility] [place] [facility] [phone]

## 2020-04-18 NOTE — Progress Notes (Signed)
Virtual Visit via Phone   This visit type was conducted due to national recommendations for restrictions regarding the COVID-19 Pandemic (e.g. social distancing) in an effort to limit this patient's exposure and mitigate transmission in our community.  Due to her co-morbid illnesses, this patient is at least at moderate risk for complications without adequate follow up.  This format is felt to be most appropriate for this patient at this time.  All issues noted in this document were discussed and addressed.  A limited physical exam was performed with this format.    This visit type was conducted due to national recommendations for restrictions regarding the COVID-19 Pandemic (e.g. social distancing) in an effort to limit this patient's exposure and mitigate transmission in our community.  Patients identity confirmed using two different identifiers.  This format is felt to be most appropriate for this patient at this time.  All issues noted in this document were discussed and addressed.  No physical exam was performed (except for noted visual exam findings with Video Visits).    Date:  04/18/2020   ID:  Valerie, [name] 08-May-1946, MRN [id number]  Patient Location:  Home, accompanied by her husband  Provider location:   Office    Chief Complaint:  "I need PT and a hospital bed"  History of Present Illness:    Valerie [name] is a 74 y.o. female who presents via video conferencing for a telehealth visit today.    The patient does not have symptoms concerning for COVID-19 infection (fever, chills, cough, or new shortness of breath).   She presents today for virtual visit. She prefers this method of contact due to COVID-19 pandemic.  She is a cancer survivor and currently on oral chemo, she does not wish to expose herself to other patients. She has been taking donepezil for cognitive decline.She would like to discuss having outpatient. PT services. She also needs medical  equipment - fully electric hospital bed. She is unable to lay flat for long periods of time due to new onset CHF.   Of note, she recently had hospitalization from 8/3-8/10 due to altered mental status.  She was found to have MRSA bacteremia and UTI.  At that time, historywasobtained from husband phone and daughter at bedside. Patient wasunaware of events surroundingher altered mental status. Reportedly patient was talking on the phone with her sister today when she fell asleep and dropped her phone. They later realized she was less responsive and decided to bring her to the ED. She did receive Benadryl about an hour prior to admission for some generalized itching. She was diagnosed with UTI with symptoms of dysuria about 3 days ago and has been taking Cipro. Otherwise no fever. No nausea, vomiting or worseningdiarrhea.Pt continues to have dysuria and notes some lower abdominal pain today.  While in ED, she was found to have mild leukocytosis and stable normocytic anemia.   EKGhad prolongedQTC of 570 and new inverted T waves in V1 to V2 compared to prior.  2D echo performed and significant for EF of 25 to 30%, global hypokinesis, grade 1 diastolic dysfunction, no vegetations seen. Unchanged from prior 2D echo. TEE negative for endocarditis. She recently completed full course of lenezolid complicated by diarrhea. She is now ready to go to PT to help build up her strength.       Past Medical History:  Diagnosis Date  . Abnormal Pap smear 2006  . Anal fistula   . Ankle fracture   .  Anxiety   . Arthritis   . Atrophic vaginitis 2008  . Cataract   . Dementia ([place]) 2009  . Dyspareunia 2008  . H/O osteoporosis   . H/O varicella   . H/O vitamin D deficiency   . Headache 07/24/2016  . Heart murmur   . History of measles, mumps, or rubella   . History of radiation therapy 07/28/13- 08/10/13   right lung metastasis 5000 cGy 10 sessions  . Hypertension   . Lung cancer ([place]) dx'd 2002  .  Lung cancer ([facility])   . Lung cancer ([place])   . Lung metastasis ([place])    PET scan 05/05/13, RUL lung nodule  . 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 Nodule of right lung CT- 06/03/12   RIGHT UPPER LOBE  . Nodule of right lung 06/03/12   Upper Lobe  . On antineoplastic chemotherapy    TARCEVA  . Osteoporosis 2010  . Primary cancer of right upper lobe of lung ([place]) 04/22/2009   Qualifier: Diagnosis of  By: Nils Pyle CMA ([place]), Mearl Latin    . Shortness of breath    hx lung ca   . Status post chemotherapy 2003   CARBOPLATIN/PACLITAXEL /STATUS POST CLINICAL TRAIL OF CELEBREX AND IRESSA AT [facility] FOR 1 YEAR  . Status post radiation therapy 2003   LEFT LUNG  . Status post radiation therapy 11/07/2005   WHOLE BRAIN: DR Larkin [facility]  . Status post radiation therapy 06/02/2008   GAMMA KNIFE OF RESECTED CAVITAY  . Thyroid adenoma    ?  [name] Thyroid cancer ([place]) 10/18/11 bx   adenoid nodules   . Yeast infection    Past Surgical History:  Procedure Laterality Date  . BRAIN SURGERY    . INTRAVASCULAR ULTRASOUND/IVUS N/A 02/16/2020   Procedure: Intravascular Ultrasound/IVUS;  Surgeon: Leonie Man, MD;  Location: [place];  Service: Cardiovascular;  Laterality: N/A;  . LEFT LOWER LOBECTOMY  05/2001  . LEFT OCCIPITAL CRANIOTOMY  09/21/2005   tumor  . LUNG LOBECTOMY    . NECK SURGERY    . ORIF ANKLE FRACTURE Left 10/02/2014   Procedure: OPEN REDUCTION INTERNAL FIXATION (ORIF) ANKLE FRACTURE;  Surgeon: Alta Corning, MD;  Location: [facility];  Service: Orthopedics;  Laterality: Left;  . ORIF FEMUR FRACTURE Right 06/11/2019   Procedure: OPEN REDUCTION INTERNAL FIXATION (ORIF) DISTAL FEMUR FRACTURE;  Surgeon: Renette Butters, MD;  Location: [facility];  Service: Orthopedics;  Laterality: Right;  . ORIF TIBIA PLATEAU Right 06/11/2019   Procedure: Open Reduction Internal Fixation (Orif) Tibial Plateau;  Surgeon: Renette Butters, MD;  Location: [place];   Service: Orthopedics;  Laterality: Right;  . RADICAL NECK DISSECTION  10/18/2011   Procedure: RADICAL NECK DISSECTION;  Surgeon: Izora Gala, MD;  Location: [facility];  Service: ENT;  Laterality: Right;  RIGHT MODIFIED NECK DISSECTION /POSSIBLE RIGHT THYROIDECTOM  . RIGHT/LEFT HEART CATH AND CORONARY ANGIOGRAPHY N/A 02/16/2020   Procedure: RIGHT/LEFT HEART CATH AND CORONARY ANGIOGRAPHY;  Surgeon: Leonie Man, MD;  Location: [place] [facility];  Service: Cardiovascular;  Laterality: N/A;  . TEE WITHOUT CARDIOVERSION N/A 03/28/2020   Procedure: TRANSESOPHAGEAL ECHOCARDIOGRAM (TEE);  Surgeon: Buford Dresser, MD;  Location: [facility];  Service: Cardiovascular;  Laterality: N/A;  . THYROIDECTOMY  10/18/2011   Procedure: THYROIDECTOMY WITH RADICAL NECK DISSECTION;  Surgeon: Izora Gala, MD;  Location: [place];  Service: ENT;  Laterality: Right;     No outpatient  medications have been marked as taking for the 04/18/20 encounter (Telemedicine) with [name], MD.     Allergies:   Keflex [cephalexin]   Social History   Tobacco Use  . Smoking status: Never Smoker  . Smokeless tobacco: Never Used  . Tobacco comment: smoked few years in college  Vaping Use  . Vaping Use: Never used  Substance Use Topics  . Alcohol use: No  . Drug use: No     Family Hx: The patient's family history includes Cancer in her father, maternal aunt, and mother; Gait disorder in her mother; Heart failure in her sister.  ROS:   Please see the history of present illness.    Review of Systems  Constitutional: Positive for malaise/fatigue.  Respiratory: Negative.   Cardiovascular: Negative.   Gastrointestinal: Negative.   Neurological: Negative.   Psychiatric/Behavioral: Negative.     All other systems reviewed and are negative.   Labs/Other Tests and Data Reviewed:    Recent Labs: 02/14/2020: B Natriuretic Peptide 985.5 02/15/2020: TSH 2.095 03/22/2020: ALT 22 03/27/2020: Magnesium 2.2 03/29/2020: BUN  11; Creatinine, Ser 0.85; Hemoglobin 11.1; Platelets 274; Potassium 3.8; Sodium 136   Recent Lipid Panel Lab Results  Component Value Date/Time   CHOL 242 (H) 02/15/2020 05:50 AM   CHOL 263 (H) 10/16/2018 01:00 PM   TRIG 97 02/15/2020 05:50 AM   HDL 68 02/15/2020 05:50 AM   HDL 90 10/16/2018 01:00 PM   CHOLHDL 3.6 02/15/2020 05:50 AM   LDLCALC 155 (H) 02/15/2020 05:50 AM   LDLCALC 143 (H) 10/16/2018 01:00 PM    Wt Readings from Last 3 Encounters:  03/23/20 153 lb 12.8 oz (69.8 kg)  02/24/20 163 lb (73.9 kg)  02/23/20 163 lb (73.9 kg)     Exam:    Vital Signs:  BP 120/71   Pulse 69   Resp 16     Physical Exam Vitals and nursing note reviewed.   FULL PE not performed, this is a phone visit.  She is able to speak in full sentences w/o labored breathing.   ASSESSMENT & PLAN:    1. Gait abnormality Comments: I will place another referral to [facility] outpatient on [address] as requested.   2. Debility Comments: Pt requires frequent repositioning of the body in ways not feasible with a standard bed or bed wedge/pillow.  Discharge summary reviewed in full detail during visit.  3. Hypokalemia Comments: I will send order to [facility] to recheck her potassium levels/renal function. She is encouraged to increase her intake of potassium rich foods.   4. Dementia without behavioral disturbance, unspecified dementia type ([place]) Comments: Chronic, yet stable.   5. Chronic combined systolic and diastolic congestive heart failure ([place]) Comments: Echo results reviewed in full detail.  Pt requires elevation of the head at 30 degrees or more when lying down.    COVID-19 Education: The signs and symptoms of COVID-19 were discussed with the patient and how to seek care for testing (follow up with PCP or arrange E-visit).  The importance of social distancing was discussed today.  Patient Risk:   After full review of this patients clinical status, I feel that they are at least moderate  risk at this time.  Time:   Today, I have spent 15 minutes/ seconds with the patient with telehealth technology discussing above diagnoses.     Medication Adjustments/Labs and Tests Ordered: Current medicines are reviewed at length with the patient today.  Concerns regarding medicines are outlined above.  Tests Ordered: No orders of the defined types were placed in this encounter. Orders for CBC, BMP and LFTs were sent to [facility]. Hopefully they can draw labs this week.   Medication Changes: No orders of the defined types were placed in this encounter.   Disposition:  Follow up in 3 month(s)  Signed, [name], MD

## 2020-04-19 ENCOUNTER — Telehealth: Payer: Medicare PPO

## 2020-04-19 ENCOUNTER — Ambulatory Visit: Payer: Medicare PPO | Admitting: Internal Medicine

## 2020-04-19 ENCOUNTER — Ambulatory Visit: Payer: Self-pay

## 2020-04-19 ENCOUNTER — Other Ambulatory Visit: Payer: Self-pay

## 2020-04-19 DIAGNOSIS — R269 Unspecified abnormalities of gait and mobility: Secondary | ICD-10-CM

## 2020-04-19 DIAGNOSIS — C3411 Malignant neoplasm of upper lobe, right bronchus or lung: Secondary | ICD-10-CM

## 2020-04-19 DIAGNOSIS — F0390 Unspecified dementia without behavioral disturbance: Secondary | ICD-10-CM

## 2020-04-19 DIAGNOSIS — R5381 Other malaise: Secondary | ICD-10-CM

## 2020-04-20 LAB — COMPREHENSIVE METABOLIC PANEL
Calcium: 9.8 (ref 8.7–10.7)
GFR calc Af Amer: 71
GFR calc non Af Amer: 62

## 2020-04-20 LAB — CBC AND DIFFERENTIAL
HCT: 40 (ref 36–46)
Hemoglobin: 12.3 (ref 12.0–16.0)
Platelets: 343 (ref 150–399)
WBC: 8.8

## 2020-04-20 LAB — LIPID PANEL
Cholesterol: 189 (ref 0–200)
HDL: 69 (ref 35–70)
LDL Cholesterol: 97
Triglycerides: 130 (ref 40–160)

## 2020-04-20 LAB — CBC: RBC: 4.23 (ref 3.87–5.11)

## 2020-04-20 LAB — BASIC METABOLIC PANEL
BUN: 18 (ref 4–21)
CO2: 24 — AB (ref 13–22)
Chloride: 105 (ref 99–108)
Creatinine: 0.9 (ref 0.5–1.1)
Glucose: 89
Potassium: 4.1 (ref 3.4–5.3)
Sodium: 140 (ref 137–147)

## 2020-04-20 MED FILL — ERLOTINIB HCL 150 MG TABS: 150 | 30 days supply | Qty: 30 | Fill #0

## 2020-04-21 NOTE — Patient Instructions (Signed)
Visit Information  Goals Addressed    . "to get her stronger"       Husband stated [place] (see longitudinal plan of care for additional care plan information)  Current Barriers:  . Chronic Disease Management support and education needs related to Malignant Neoplasm of upper lobe of right lung; Dementia w/o behavior disturbance, unspecified dementia type, Gait abnormality, Debility . Cognitive Deficits  Nurse Case Manager Clinical Goal(s):  [name] Over the next 90 days, patient will work with the CCM team and PCP to address needs related to disease education and support to improve Self Health management of chronic health conditions  CCM RN CM Interventions:  04/19/20 call completed with husband . Inter-disciplinary care team collaboration (see longitudinal plan of care) . Evaluation of current treatment plan related to Impaired Physical Mobility and Self Care deficit and patient's adherence to plan as established by provider . Determined patient has all DME in the home needed at this time . Determined [facility] has been ordered by PCP Dr. [name] and the initial visit has been completed per husband  . Determined husband is providing care for patient and transportation to MD appointments and upcoming outpatient PT that has been scheduled for 04/26/20, same day as abdominal CT will be completed . Discussed plans with patient for ongoing care management follow up and provided husband with direct contact information for care management team  Patient Self Care Activities:  . Attends all scheduled provider appointments . Calls provider office for new concerns or questions . Unable to self administer medications as prescribed . Unable to perform ADLs independently . Unable to perform IADLs independently . Supportive spouse to assist with care needs  Initial goal documentation     . COMPLETED: Assist with Chronic Care Management and Care Coordination needs       CARE PLAN  ENTRY (see longtitudinal plan of care for additional care plan information)   Current Barriers:  . Chronic Disease Management support, education, and care coordination needs related to Malignant Neoplasm of upper lobe of right lung; Dementia w/o behavior disturbance, unspecified type  Case Manager Clinical Goal(s):  [name] Over the next 30 days, patient will work with BSW to address needs related to Transportation in patient with Malignant Neoplasm of upper lobe of right lung, Dementia w/o behavior disturbance, unspecified type   Interventions:  . Collaborated with BSW to initiate plan of care to address needs related to Transportation in patient with Malignant Neoplasm of upper lobe of right lobe; Dementia w/o behavior disturbance, unspecified type  Patient Self Care Activities:  . Patient verbalizes understanding of plan to work with embedded BSW for assistance with transportation  . Supportive husband to assist with care needs   Initial goal documentation       Patient verbalizes understanding of instructions provided today.   Telephone follow up appointment with care management team member scheduled for: 05/26/20  Barb Merino, RN, BSN, CCM Care Management Coordinator [place] [facility]  Direct Phone: [phone]

## 2020-04-21 NOTE — Chronic Care Management (AMB) (Signed)
Chronic Care Management   Initial Visit Note  04/19/2020 Name: Valerie [name] MRN: [id number] DOB: Aug 18, 1946  Referred by: [name], MD Reason for referral : Chronic Care Management (RQ Initial RN CM Call )   Valerie [name] is a 74 y.o. year old female who is a primary care patient of [name], MD. The CCM team was consulted for assistance with chronic disease management and care coordination needs related to Malignant Neoplasm of upper lobe of right lung; Dementia w/o behavior disturbance, unspecified dementia type, Gait abnormality, Debility.  Review of patient status, including review of consultants reports, relevant laboratory and other test results, and collaboration with appropriate care team members and the patient's provider was performed as part of comprehensive patient evaluation and provision of chronic care management services.    SDOH (Social Determinants of Health) assessments performed: Yes See Care Plan activities for detailed interventions related to [place] initial outbound CCM RN CM call to spouse to assess for CCM needs, care plan updated.     Medications: Outpatient Encounter Medications as of 04/19/2020  Medication Sig Note  . ammonium lactate (LAC-HYDRIN) 12 % lotion Apply topically 2 (two) times daily. (Patient not taking: Reported on 03/23/2020)   . Ascorbic Acid (VITAMIN C) 100 MG tablet Take 100 mg by mouth every evening. Gummie   . atorvastatin (LIPITOR) 80 MG tablet Take 1 tablet (80 mg total) by mouth at bedtime.   . clopidogrel (PLAVIX) 75 MG tablet Take 1 tablet (75 mg total) by mouth daily with breakfast.   . erlotinib (TARCEVA) 150 MG tablet TAKE 1 TABLET (150 MG TOTAL) BY MOUTH DAILY. TAKE ON AN EMPTY STOMACH 1 HOUR BEFORE MEALS OR 2 HOURS AFTER.   [name] MAGNESIUM PO Take 1 tablet by mouth daily.    . metoprolol succinate (TOPROL-XL) 25 MG 24 hr tablet Take 1 tablet (25 mg total) by mouth daily.   . mometasone (ELOCON) 0.1 %  cream Apply 1 application topically 2 (two) times daily.   . Multiple Vitamin (MULTI-VITAMINS) TABS Take 1 tablet by mouth every evening.    . polyethylene glycol (MIRALAX / GLYCOLAX) 17 g packet Take 17 g by mouth daily. (Patient taking differently: Take 17 g by mouth daily as needed for mild constipation. )   . potassium chloride SA (KLOR-CON) 20 MEQ tablet Take 2 tablets (40 mEq total) by mouth daily.   [name] saccharomyces boulardii (FLORASTOR) 250 MG capsule Take 1 capsule (250 mg total) by mouth 2 (two) times daily.   . vitamin B-12 (CYANOCOBALAMIN) 1000 MCG tablet Take 1,000 mcg by mouth every evening.   [name] 20 MG TABS tablet Take 1 tablet (20 mg total) by mouth every evening. 02/15/2020: PT reported LD of 6/27 ~11AM   No facility-administered encounter medications on file as of 04/19/2020.     Objective:  Lab Results  Component Value Date   HGBA1C 5.1 10/16/2018   Lab Results  Component Value Date   MICROALBUR 30 05/25/2019   LDLCALC 155 (H) 02/15/2020   CREATININE 0.85 03/29/2020   BP Readings from Last 3 Encounters:  04/18/20 120/71  03/29/20 (!) 107/58  02/24/20 99/67    Goals Addressed    . "to get her stronger"       Husband stated [place] (see longitudinal plan of care for additional care plan information)  Current Barriers:  . Chronic Disease Management support and education needs related to Malignant Neoplasm of upper lobe of right lung; Dementia  w/o behavior disturbance, unspecified dementia type, Gait abnormality, Debility . Cognitive Deficits  Nurse Case Manager Clinical Goal(s):  [name] Over the next 90 days, patient will work with the CCM team and PCP to address needs related to disease education and support to improve Self Health management of chronic health conditions  CCM RN CM Interventions:  04/19/20 call completed with husband . Inter-disciplinary care team collaboration (see longitudinal plan of care) . Evaluation of current treatment plan  related to Impaired Physical Mobility and Self Care deficit and patient's adherence to plan as established by provider . Determined patient has all DME in the home needed at this time . Determined Remote Health has been ordered by PCP Dr. [name] and the initial visit has been completed per husband  . Determined husband is providing care for patient and transportation to MD appointments and upcoming outpatient PT that has been scheduled for 04/26/20, same day as abdominal CT will be completed . Discussed plans with patient for ongoing care management follow up and provided husband with direct contact information for care management team  Patient Self Care Activities:  . Attends all scheduled provider appointments . Calls provider office for new concerns or questions . Unable to self administer medications as prescribed . Unable to perform ADLs independently . Unable to perform IADLs independently . Supportive spouse to assist with care needs  Initial goal documentation     . COMPLETED: Assist with Chronic Care Management and Care Coordination needs       CARE PLAN ENTRY (see longtitudinal plan of care for additional care plan information)   Current Barriers:  . Chronic Disease Management support, education, and care coordination needs related to Malignant Neoplasm of upper lobe of right lung; Dementia w/o behavior disturbance, unspecified type  Case Manager Clinical Goal(s):  [name] Over the next 30 days, patient will work with BSW to address needs related to Transportation in patient with Malignant Neoplasm of upper lobe of right lung, Dementia w/o behavior disturbance, unspecified type   Interventions:  . Collaborated with BSW to initiate plan of care to address needs related to Transportation in patient with Malignant Neoplasm of upper lobe of right lobe; Dementia w/o behavior disturbance, unspecified type  Patient Self Care Activities:  . Patient verbalizes understanding of plan to  work with embedded BSW for assistance with transportation  . Supportive husband to assist with care needs   Initial goal documentation       Plan:   Telephone follow up appointment with care management team member scheduled for: 05/26/20  Barb Merino, RN, BSN, CCM Care Management Coordinator [place] [facility]  Direct Phone: [phone]

## 2020-04-22 DIAGNOSIS — R54 Age-related physical debility: Secondary | ICD-10-CM | POA: Diagnosis not present

## 2020-04-26 ENCOUNTER — Encounter: Payer: Self-pay | Admitting: Internal Medicine

## 2020-04-26 ENCOUNTER — Inpatient Hospital Stay: Payer: Medicare PPO

## 2020-04-26 ENCOUNTER — Inpatient Hospital Stay: Payer: Medicare PPO | Attending: Internal Medicine | Admitting: Internal Medicine

## 2020-04-26 ENCOUNTER — Other Ambulatory Visit: Payer: Self-pay

## 2020-04-26 ENCOUNTER — Ambulatory Visit (HOSPITAL_COMMUNITY)
Admission: RE | Admit: 2020-04-26 | Discharge: 2020-04-26 | Disposition: A | Discharge status: Home / Self Care | Payer: Medicare PPO | Source: Ambulatory Visit | Attending: Internal Medicine | Admitting: Internal Medicine

## 2020-04-26 VITALS — BP 114/73 | HR 75 | Temp 97.6°F | Resp 18 | Ht 67.0 in

## 2020-04-26 DIAGNOSIS — Z923 Personal history of irradiation: Secondary | ICD-10-CM | POA: Diagnosis not present

## 2020-04-26 DIAGNOSIS — D638 Anemia in other chronic diseases classified elsewhere: Secondary | ICD-10-CM | POA: Diagnosis not present

## 2020-04-26 DIAGNOSIS — C3490 Malignant neoplasm of unspecified part of unspecified bronchus or lung: Secondary | ICD-10-CM | POA: Diagnosis not present

## 2020-04-26 DIAGNOSIS — I420 Dilated cardiomyopathy: Secondary | ICD-10-CM

## 2020-04-26 DIAGNOSIS — Z5111 Encounter for antineoplastic chemotherapy: Secondary | ICD-10-CM

## 2020-04-26 DIAGNOSIS — C7931 Secondary malignant neoplasm of brain: Secondary | ICD-10-CM | POA: Diagnosis not present

## 2020-04-26 DIAGNOSIS — Z79899 Other long term (current) drug therapy: Secondary | ICD-10-CM | POA: Diagnosis not present

## 2020-04-26 DIAGNOSIS — C3411 Malignant neoplasm of upper lobe, right bronchus or lung: Secondary | ICD-10-CM

## 2020-04-26 DIAGNOSIS — I110 Hypertensive heart disease with heart failure: Secondary | ICD-10-CM | POA: Insufficient documentation

## 2020-04-26 DIAGNOSIS — Z9221 Personal history of antineoplastic chemotherapy: Secondary | ICD-10-CM | POA: Diagnosis not present

## 2020-04-26 DIAGNOSIS — C3432 Malignant neoplasm of lower lobe, left bronchus or lung: Secondary | ICD-10-CM | POA: Diagnosis not present

## 2020-04-26 LAB — CBC WITH DIFFERENTIAL (CANCER CENTER ONLY)
Abs Immature Granulocytes: 0.01 10*3/uL (ref 0.00–0.07)
Basophils Absolute: 0.1 10*3/uL (ref 0.0–0.1)
Basophils Relative: 1 %
Eosinophils Absolute: 0.3 10*3/uL (ref 0.0–0.5)
Eosinophils Relative: 4 %
HCT: 37.6 % (ref 36.0–46.0)
Hemoglobin: 11.6 g/dL — ABNORMAL LOW (ref 12.0–15.0)
Immature Granulocytes: 0 %
Lymphocytes Relative: 11 %
Lymphs Abs: 0.9 10*3/uL (ref 0.7–4.0)
MCH: 29.3 pg (ref 26.0–34.0)
MCHC: 30.9 g/dL (ref 30.0–36.0)
MCV: 94.9 fL (ref 80.0–100.0)
Monocytes Absolute: 0.4 10*3/uL (ref 0.1–1.0)
Monocytes Relative: 6 %
Neutro Abs: 5.8 10*3/uL (ref 1.7–7.7)
Neutrophils Relative %: 78 %
Platelet Count: 309 10*3/uL (ref 150–400)
RBC: 3.96 MIL/uL (ref 3.87–5.11)
RDW: 16.5 % — ABNORMAL HIGH (ref 11.5–15.5)
WBC Count: 7.4 10*3/uL (ref 4.0–10.5)
nRBC: 0 % (ref 0.0–0.2)

## 2020-04-26 LAB — CMP (CANCER CENTER ONLY)
ALT: 10 U/L (ref 0–44)
AST: 10 U/L — ABNORMAL LOW (ref 15–41)
Albumin: 3.4 g/dL — ABNORMAL LOW (ref 3.5–5.0)
Alkaline Phosphatase: 97 U/L (ref 38–126)
Anion gap: 6 (ref 5–15)
BUN: 17 mg/dL (ref 8–23)
CO2: 25 mmol/L (ref 22–32)
Calcium: 9.5 mg/dL (ref 8.9–10.3)
Chloride: 108 mmol/L (ref 98–111)
Creatinine: 0.92 mg/dL (ref 0.44–1.00)
GFR, Est AFR Am: >60 mL/min (ref >60)
GFR, Estimated: >60 mL/min (ref >60)
Glucose, Bld: 113 mg/dL — ABNORMAL HIGH (ref 70–99)
Potassium: 3.2 mmol/L — ABNORMAL LOW (ref 3.5–5.1)
Sodium: 139 mmol/L (ref 135–145)
Total Bilirubin: 0.7 mg/dL (ref 0.3–1.2)
Total Protein: 6.6 g/dL (ref 6.5–8.1)

## 2020-04-26 MED ORDER — IOHEXOL 9 MG/ML PO SOLN
ORAL | Status: AC
  Filled 2020-04-26: qty 1000

## 2020-04-26 MED ORDER — IOHEXOL 9 MG/ML PO SOLN: 1000.0000 mL | ORAL | Status: AC

## 2020-04-26 MED ORDER — IOHEXOL 300 MG/ML  SOLN
100.0000 mL | Freq: Once | INTRAMUSCULAR | Status: AC | PRN
  Administered 2020-04-26: 100 mL via INTRAVENOUS

## 2020-04-26 NOTE — Progress Notes (Signed)
[place] [phone]   [phone]  OFFICE PROGRESS NOTE  [name], [address] [place] [address]  PRINCIPAL DIAGNOSIS:  metastatic non-small cell lung cancer, adenocarcinoma with brain metastases initially diagnosed as a stage IIb in October 2002.   BIOMARKERS TESTING ([facility]): Positive for: EGFR E746-A750 del, T790M, CTNNB1 D32G, SMAD4 T178fs*10 Negative for: RET, ALK, BRAF, KRAS, ERBB2 and MET  PRIOR THERAPY:  1. Status post left lower lobectomy under the care of Dr Arlyce Dice in October 2002. 2. Status post course of concurrent chemoradiation with weekly carboplatin and paclitaxel under the care of Dr Benay Spice and Dr Elba Barman in early 2003. 3. Status post treatment with Celebrex and Iressa according to the clinical trial at [facility] for a total of 1 year. 4. Status post left occipital craniotomy for tumor resection on September 21, 2005, under the care of Dr Christella Noa. 5. Status post whole brain irradiation under the care of Dr Elba Barman, completed November 07, 2005. 6. Status post gamma knife treatment to the resected cavitary recurrence on June 02, 2008, at [facility]. 7. Status post right thyroidectomy with radical neck dissection under the care of Dr. Constance Holster on 10/18/2011. 8. Status post excisional biopsy of right cervical lymph node that was consistent with metastatic adenocarcinoma. 9. Tarceva 150 mg by mouth daily with therapy beginning 06/03/2007, discontinued in July 2015 secondary to disease progression. Status post 79 cycles. 10. palliative radiotherapy to the enlarging right upper lobe lung nodule under the care of Dr. Valere Dross. 11. Treatment at [facility] on STUDY [id number]  300 mg by mouth daily for 21 days discontinued secondary to study deviation and inability for the patient to keep her appointment at [facility] secondary to recent hip fractures. 12. Open reduction and  internal fixation of trimalleolar ankle fracture dislocation with fixation of the fibula as well as medial malleolus. 13. Tagrisso 80 mg by mouth daily started 11/25/2014, status post 62 months of treatment.  This was discontinued on 02/13/2020 secondary to congestive heart failure with ejection fraction of 20-25%.   CURRENT THERAPY: Tarceva 150 mg p.o. daily.  Started 02/26/2020.  INTERVAL HISTORY: Valerie [name] 74 y.o. female returns to the clinic today for follow-up visit accompanied by her husband.  The patient continues to complain of increasing fatigue and weakness.  She was admitted to [facility] few weeks ago with MRSA bacteremia when she presented with mental status change and acute encephalopathy that was initially felt to be secondary to urinary tract infection but the blood culture showed MRSA bacteremia.  The patient also had diastolic congestive heart failure.  She was started on treatment with IV vancomycin during her hospitalization and was discharged home on linezolid.  She continued her treatment with Tarceva after few days of interruption during her hospitalization.  She continues to tolerate the treatment well with no concerning complaints.  She denied having any chest pain, but has baseline shortness of breath with mild cough and no hemoptysis.  She denied having any fever or chills.  She has no nausea, vomiting, diarrhea or constipation.  She denied having any skin rash.  The patient is here today for evaluation after repeating CT scan of the chest, abdomen pelvis earlier today.   MEDICAL HISTORY: Past Medical History:  Diagnosis Date  . Abnormal Pap smear 2006  . Anal fistula   . Ankle fracture   . Anxiety   . Arthritis   .  Atrophic vaginitis 2008  . Cataract   . Dementia ([place]) 2009  . Dyspareunia 2008  . H/O osteoporosis   . H/O varicella   . H/O vitamin D deficiency   . Headache 07/24/2016  . Heart murmur   . History of measles, mumps, or rubella   .  History of radiation therapy 07/28/13- 08/10/13   right lung metastasis 5000 cGy 10 sessions  . Hypertension   . Lung cancer ([place]) dx'd 2002  . Lung cancer ([place])   . Lung cancer ([place])   . Lung metastasis ([place])    PET scan 05/05/13, RUL lung nodule  . Metastasis to brain [facility]) dx'd 2008  . 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 Nodule of right lung CT- 06/03/12   RIGHT UPPER LOBE  . Nodule of right lung 06/03/12   Upper Lobe  . On antineoplastic chemotherapy    TARCEVA  . Osteoporosis 2010  . Primary cancer of right upper lobe of lung ([place]) 04/22/2009   Qualifier: Diagnosis of  By: Nils Pyle CMA ([place]), Mearl Latin    . Shortness of breath    hx lung ca   . Status post chemotherapy 2003   CARBOPLATIN/PACLITAXEL /STATUS POST CLINICAL TRAIL OF CELEBREX AND IRESSA AT [facility] FOR 1 YEAR  . Status post radiation therapy 2003   LEFT LUNG  . Status post radiation therapy 11/07/2005   WHOLE BRAIN: DR Larkin [facility]  . Status post radiation therapy 06/02/2008   GAMMA KNIFE OF RESECTED CAVITAY  . Thyroid adenoma    ?  [name] Thyroid cancer ([place]) 10/18/11 bx   adenoid nodules   . Yeast infection     ALLERGIES:  is allergic to keflex [cephalexin].  MEDICATIONS:  Current Outpatient Medications  Medication Sig Dispense Refill  . ammonium lactate (LAC-HYDRIN) 12 % lotion Apply topically 2 (two) times daily. (Patient not taking: Reported on 03/23/2020) 400 g 0  . Ascorbic Acid (VITAMIN C) 100 MG tablet Take 100 mg by mouth every evening. Gummie    . atorvastatin (LIPITOR) 80 MG tablet Take 1 tablet (80 mg total) by mouth at bedtime. 30 tablet 0  . clopidogrel (PLAVIX) 75 MG tablet Take 1 tablet (75 mg total) by mouth daily with breakfast. 30 tablet 6  . erlotinib (TARCEVA) 150 MG tablet TAKE 1 TABLET (150 MG TOTAL) BY MOUTH DAILY. TAKE ON AN EMPTY STOMACH 1 HOUR BEFORE MEALS OR 2 HOURS AFTER. 30 tablet 1  . MAGNESIUM PO Take 1 tablet by mouth daily.     . metoprolol succinate (TOPROL-XL) 25 MG 24 hr  tablet Take 1 tablet (25 mg total) by mouth daily. 30 tablet 2  . mometasone (ELOCON) 0.1 % cream Apply 1 application topically 2 (two) times daily.    . Multiple Vitamin (MULTI-VITAMINS) TABS Take 1 tablet by mouth every evening.     . polyethylene glycol (MIRALAX / GLYCOLAX) 17 g packet Take 17 g by mouth daily. (Patient taking differently: Take 17 g by mouth daily as needed for mild constipation. ) 14 each 0  . potassium chloride SA (KLOR-CON) 20 MEQ tablet Take 2 tablets (40 mEq total) by mouth daily. 20 tablet 0  . saccharomyces boulardii (FLORASTOR) 250 MG capsule Take 1 capsule (250 mg total) by mouth 2 (two) times daily. 60 capsule 2  . vitamin B-12 (CYANOCOBALAMIN) 1000 MCG tablet Take 1,000 mcg by mouth every evening.    [name] 20 MG TABS tablet Take 1 tablet (20 mg total) by mouth every evening.  90 tablet 1   No current facility-administered medications for this visit.   Facility-Administered Medications Ordered in Other Visits  Medication Dose Route Frequency Provider Last Rate Last Admin  . iohexol (OMNIPAQUE) 9 MG/ML oral solution             SURGICAL HISTORY:  Past Surgical History:  Procedure Laterality Date  . BRAIN SURGERY    . INTRAVASCULAR ULTRASOUND/IVUS N/A 02/16/2020   Procedure: Intravascular Ultrasound/IVUS;  Surgeon: Leonie Man, MD;  Location: [facility];  Service: Cardiovascular;  Laterality: N/A;  . LEFT LOWER LOBECTOMY  05/2001  . LEFT OCCIPITAL CRANIOTOMY  09/21/2005   tumor  . LUNG LOBECTOMY    . NECK SURGERY    . ORIF ANKLE FRACTURE Left 10/02/2014   Procedure: OPEN REDUCTION INTERNAL FIXATION (ORIF) ANKLE FRACTURE;  Surgeon: Alta Corning, MD;  Location: [facility];  Service: Orthopedics;  Laterality: Left;  . ORIF FEMUR FRACTURE Right 06/11/2019   Procedure: OPEN REDUCTION INTERNAL FIXATION (ORIF) DISTAL FEMUR FRACTURE;  Surgeon: Renette Butters, MD;  Location: [place];  Service: Orthopedics;  Laterality: Right;  . ORIF TIBIA PLATEAU Right  06/11/2019   Procedure: Open Reduction Internal Fixation (Orif) Tibial Plateau;  Surgeon: Renette Butters, MD;  Location: [place];  Service: Orthopedics;  Laterality: Right;  . RADICAL NECK DISSECTION  10/18/2011   Procedure: RADICAL NECK DISSECTION;  Surgeon: Izora Gala, MD;  Location: [place];  Service: ENT;  Laterality: Right;  RIGHT MODIFIED NECK DISSECTION /POSSIBLE RIGHT THYROIDECTOM  . RIGHT/LEFT HEART CATH AND CORONARY ANGIOGRAPHY N/A 02/16/2020   Procedure: RIGHT/LEFT HEART CATH AND CORONARY ANGIOGRAPHY;  Surgeon: Leonie Man, MD;  Location: [place] [facility];  Service: Cardiovascular;  Laterality: N/A;  . TEE WITHOUT CARDIOVERSION N/A 03/28/2020   Procedure: TRANSESOPHAGEAL ECHOCARDIOGRAM (TEE);  Surgeon: Buford Dresser, MD;  Location: [facility];  Service: Cardiovascular;  Laterality: N/A;  . THYROIDECTOMY  10/18/2011   Procedure: THYROIDECTOMY WITH RADICAL NECK DISSECTION;  Surgeon: Izora Gala, MD;  Location: [place];  Service: ENT;  Laterality: Right;    REVIEW OF SYSTEMS:  Constitutional: positive for fatigue Eyes: negative Ears, nose, mouth, throat, and face: negative Respiratory: positive for dyspnea on exertion Cardiovascular: negative Gastrointestinal: negative Genitourinary:negative Integument/breast: negative Hematologic/lymphatic: negative Musculoskeletal:positive for muscle weakness Neurological: positive for weakness Behavioral/Psych: negative Endocrine: negative Allergic/Immunologic: negative   PHYSICAL EXAMINATION: General appearance: alert, cooperative, fatigued and no distress Head: Normocephalic, without obvious abnormality, atraumatic Neck: no adenopathy, supple, symmetrical, trachea midline and thyroid not enlarged, symmetric, no tenderness/mass/nodules Lymph nodes: Cervical, supraclavicular, and axillary nodes normal. Resp: clear to auscultation bilaterally Back: symmetric, no curvature. ROM normal. No CVA tenderness. Cardio: regular rate and  rhythm, S1, S2 normal, no murmur, click, rub or gallop GI: soft, non-tender; bowel sounds normal; no masses,  no organomegaly Extremities: extremities normal, atraumatic, no cyanosis or edema Neurologic: Motor: Weakness of the lower extremities bilaterally  ECOG PERFORMANCE STATUS: 1 - Symptomatic but completely ambulatory  Blood pressure 114/73, pulse 75, temperature 97.6 F (36.4 C), temperature source Oral, resp. rate 18, height $RemoveBe'5\' 7"'oNFwTevaF$  (1.702 m), SpO2 100 %.  LABORATORY DATA: Lab Results  Component Value Date   WBC 7.4 04/26/2020   HGB 11.6 (L) 04/26/2020   HCT 37.6 04/26/2020   MCV 94.9 04/26/2020   PLT 309 04/26/2020      Chemistry      Component Value Date/Time   NA 139 04/26/2020 1251   NA 148 (H) 10/16/2018 1300   NA 141  08/06/2017 1047   K 3.2 (L) 04/26/2020 1251   K 3.9 08/06/2017 1047   CL 108 04/26/2020 1251   CL 106 12/09/2012 0806   CO2 25 04/26/2020 1251   CO2 24 08/06/2017 1047   BUN 17 04/26/2020 1251   BUN 16 10/16/2018 1300   BUN 15.4 08/06/2017 1047   CREATININE 0.92 04/26/2020 1251   CREATININE 1.0 08/06/2017 1047      Component Value Date/Time   CALCIUM 9.5 04/26/2020 1251   CALCIUM 9.8 08/06/2017 1047   ALKPHOS 97 04/26/2020 1251   ALKPHOS 65 08/06/2017 1047   AST 10 (L) 04/26/2020 1251   AST 11 08/06/2017 1047   ALT 10 04/26/2020 1251   ALT 9 08/06/2017 1047   BILITOT 0.7 04/26/2020 1251   BILITOT 0.66 08/06/2017 1047       RADIOGRAPHIC STUDIES: CT Chest W Contrast  Result Date: 04/26/2020 CLINICAL DATA:  Non-small cell lung cancer staging, history of metastatic non-small cell lung cancer. Post partial lung resection and radiotherapy with ongoing systemic therapy. EXAM: CT CHEST, ABDOMEN, AND PELVIS WITH CONTRAST TECHNIQUE: Multidetector CT imaging of the chest, abdomen and pelvis was performed following the standard protocol during bolus administration of intravenous contrast. CONTRAST:  19mL OMNIPAQUE IOHEXOL 300 MG/ML  SOLN COMPARISON:   CT chest from 2021 and abdomen and pelvis CT from April of 2021. FINDINGS: CT CHEST FINDINGS Cardiovascular: Stable appearance of the aorta. Calcified atheromatous plaque in the thoracic aorta is mild. No aneurysmal dilation. Diminished size of pericardial fluid that was present on the prior study with only trace pericardial fluid remaining. Central pulmonary vasculature aside from distortion after LEFT lower lobectomy at the LEFT hilum is unremarkable on venous phase assessment. Mediastinum/Nodes: Post RIGHT hemithyroidectomy. No axillary lymphadenopathy. No mediastinal or hilar lymphadenopathy. Lungs/Pleura: Post thoracotomy for LEFT lower lobectomy with distortion of the LEFT chest wall showing similar appearance to the prior study. Small LEFT-sided pleural effusion is diminished in size. Peribronchovascular nodularity in the RIGHT upper lobe with similar appearance, nodularity is within 1 mm of previous size at approximately 7 x 9 mm. (Image 47, series 6) dominant area of soft tissue bronchovascular bundle at 12 x 7 mm on today's study previously approximately 16 x 11 mm. Small nodule at the RIGHT lung base measuring 7 x 4 mm Musculoskeletal: See below for full musculoskeletal detail. Kyphotic angulation about a compression fracture in the lower thoracic spine shows similar appearance to the previous imaging study. CT ABDOMEN PELVIS FINDINGS Hepatobiliary: No focal, suspicious hepatic lesion. Gallbladder is collapsed. Mild biliary ductal distension similar to the prior study in the setting of small periampullary duodenal diverticulum. Pancreas: Pancreas without focal lesion or signs of ductal dilation. No pancreatic inflammation. Spleen: Spleen normal in size and contour. Adrenals/Urinary Tract: Adrenal glands are normal. Symmetric renal enhancement. No hydronephrosis. Trabeculated urinary bladder, mildly trabeculated with mild perivesical stranding shows a similar appearance to the previous study.  Stomach/Bowel: Stomach and small bowel without signs of acute process. The appendix is normal. Sigmoid colon with mild thickening. There was a large amount of rectal stool on the prior study. This is not present on the current exam. There is mild perirectal stranding previously. The rectum is thickened with mural stratification and mucosal enhancement. Vascular/Lymphatic: Calcified atheromatous plaque in the abdominal aorta. No adenopathy in the retroperitoneum. No adenopathy in the pelvis. Reproductive: Thickening of the endometrium is suggested with hypodense area in the uterus centrally measuring up to 13 mm. Presumed leiomyoma in the fundus of the  uterus is unchanged. No adnexal mass. Other: No ascites.  No free air. Musculoskeletal: Signs of vertebral compression fractures with similar appearance to previous imaging postprocedural changes of thoracotomy about the LEFT chest wall. No destructive bone finding. Spinal degenerative changes elsewhere and similar appearance of L1 and L3 compression fracture. IMPRESSION: 1. Imaging findings suggestive of proctocolitis,, correlate with any abdominal or pelvic symptoms. 2. Signs of prior LEFT lower lobectomy with distortion of the LEFT chest wall. 3. Decrease in size of LEFT-sided pleural effusion. 4. New small nodule in the RIGHT lower lobe, attention on follow-up potentially related to disease but currently quite small. Short interval follow-up may be helpful. 5. Nodularity and soft tissue along the bronchovascular structures in the RIGHT upper lobe stable to slightly diminished in size with diminished peripheral nodularity. 6. No signs of metastatic disease to the abdomen or pelvis. 7. Thickening of the endometrium with hypodense area in the uterus centrally measuring up to 13 mm. Neoplasm could have this appearance though findings are nonspecific and not significantly changed from previous imaging. 8. Trabeculated urinary bladder, mildly trabeculated with mild  perivesical stranding. Findings are similar to the prior study and may be due to chronic outlet obstruction. Correlate with any symptoms of cystitis. 9. Aortic atherosclerosis. These results will be called to the ordering clinician or representative by the Radiologist Assistant, and communication documented in the PACS or [employer]. Aortic Atherosclerosis (ICD10-I70.0). Electronically Signed   By: Zetta Bills M.D.   On: 04/26/2020 16:33   CT Abdomen Pelvis W Contrast  Result Date: 04/26/2020 CLINICAL DATA:  Non-small cell lung cancer staging, history of metastatic non-small cell lung cancer. Post partial lung resection and radiotherapy with ongoing systemic therapy. EXAM: CT CHEST, ABDOMEN, AND PELVIS WITH CONTRAST TECHNIQUE: Multidetector CT imaging of the chest, abdomen and pelvis was performed following the standard protocol during bolus administration of intravenous contrast. CONTRAST:  174mL OMNIPAQUE IOHEXOL 300 MG/ML  SOLN COMPARISON:  CT chest from 2021 and abdomen and pelvis CT from April of 2021. FINDINGS: CT CHEST FINDINGS Cardiovascular: Stable appearance of the aorta. Calcified atheromatous plaque in the thoracic aorta is mild. No aneurysmal dilation. Diminished size of pericardial fluid that was present on the prior study with only trace pericardial fluid remaining. Central pulmonary vasculature aside from distortion after LEFT lower lobectomy at the LEFT hilum is unremarkable on venous phase assessment. Mediastinum/Nodes: Post RIGHT hemithyroidectomy. No axillary lymphadenopathy. No mediastinal or hilar lymphadenopathy. Lungs/Pleura: Post thoracotomy for LEFT lower lobectomy with distortion of the LEFT chest wall showing similar appearance to the prior study. Small LEFT-sided pleural effusion is diminished in size. Peribronchovascular nodularity in the RIGHT upper lobe with similar appearance, nodularity is within 1 mm of previous size at approximately 7 x 9 mm. (Image 47, series 6)  dominant area of soft tissue bronchovascular bundle at 12 x 7 mm on today's study previously approximately 16 x 11 mm. Small nodule at the RIGHT lung base measuring 7 x 4 mm Musculoskeletal: See below for full musculoskeletal detail. Kyphotic angulation about a compression fracture in the lower thoracic spine shows similar appearance to the previous imaging study. CT ABDOMEN PELVIS FINDINGS Hepatobiliary: No focal, suspicious hepatic lesion. Gallbladder is collapsed. Mild biliary ductal distension similar to the prior study in the setting of small periampullary duodenal diverticulum. Pancreas: Pancreas without focal lesion or signs of ductal dilation. No pancreatic inflammation. Spleen: Spleen normal in size and contour. Adrenals/Urinary Tract: Adrenal glands are normal. Symmetric renal enhancement. No hydronephrosis. Trabeculated urinary bladder,  mildly trabeculated with mild perivesical stranding shows a similar appearance to the previous study. Stomach/Bowel: Stomach and small bowel without signs of acute process. The appendix is normal. Sigmoid colon with mild thickening. There was a large amount of rectal stool on the prior study. This is not present on the current exam. There is mild perirectal stranding previously. The rectum is thickened with mural stratification and mucosal enhancement. Vascular/Lymphatic: Calcified atheromatous plaque in the abdominal aorta. No adenopathy in the retroperitoneum. No adenopathy in the pelvis. Reproductive: Thickening of the endometrium is suggested with hypodense area in the uterus centrally measuring up to 13 mm. Presumed leiomyoma in the fundus of the uterus is unchanged. No adnexal mass. Other: No ascites.  No free air. Musculoskeletal: Signs of vertebral compression fractures with similar appearance to previous imaging postprocedural changes of thoracotomy about the LEFT chest wall. No destructive bone finding. Spinal degenerative changes elsewhere and similar appearance  of L1 and L3 compression fracture. IMPRESSION: 1. Imaging findings suggestive of proctocolitis,, correlate with any abdominal or pelvic symptoms. 2. Signs of prior LEFT lower lobectomy with distortion of the LEFT chest wall. 3. Decrease in size of LEFT-sided pleural effusion. 4. New small nodule in the RIGHT lower lobe, attention on follow-up potentially related to disease but currently quite small. Short interval follow-up may be helpful. 5. Nodularity and soft tissue along the bronchovascular structures in the RIGHT upper lobe stable to slightly diminished in size with diminished peripheral nodularity. 6. No signs of metastatic disease to the abdomen or pelvis. 7. Thickening of the endometrium with hypodense area in the uterus centrally measuring up to 13 mm. Neoplasm could have this appearance though findings are nonspecific and not significantly changed from previous imaging. 8. Trabeculated urinary bladder, mildly trabeculated with mild perivesical stranding. Findings are similar to the prior study and may be due to chronic outlet obstruction. Correlate with any symptoms of cystitis. 9. Aortic atherosclerosis. These results will be called to the ordering clinician or representative by the Radiologist Assistant, and communication documented in the PACS or [employer]. Aortic Atherosclerosis (ICD10-I70.0). Electronically Signed   By: Zetta Bills M.D.   On: 04/26/2020 16:33   ECHO TEE  Result Date: 03/28/2020    TRANSESOPHOGEAL ECHO REPORT   Patient Name:   Valerie [name] [facility] Date of Exam: 03/28/2020 Medical Rec #:  [id number]           Height:       67.0 in Accession #:    [id number]          Weight:       153.8 lb Date of Birth:  11-Sep-1945          BSA:          1.809 m Patient Age:    74 years            BP:           127/79 mmHg Patient Gender: F                   HR:           108 bpm. Exam Location:  Inpatient Procedure: Transesophageal Echo, Cardiac Doppler and Color Doppler Indications:      Endocarditis  History:         Patient has prior history of Echocardiogram examinations, most                  recent 03/24/2020. CAD, Signs/Symptoms:Murmur and Shortness of  Breath; Risk Factors:Hypertension and Non-Smoker.  Sonographer:     Vickie Epley RDCS Referring Phys:  [id number] Abigail Butts Diagnosing Phys: Buford Dresser MD PROCEDURE: The transesophogeal probe was passed without difficulty through the esophogus of the patient. Sedation performed by different physician. The patient was monitored while under deep sedation. Anesthestetic sedation was provided intravenously by Anesthesiology: 139mg  of Propofol, 20mg  of Lidocaine. Image quality was good. The patient's vital signs; including heart rate, blood pressure, and oxygen saturation; remained stable throughout the procedure. The patient developed no complications during the procedure. IMPRESSIONS  1. Left ventricular ejection fraction, by estimation, is 25 to 30%. The left ventricle has severely decreased function. The left ventricle demonstrates global hypokinesis.  2. Right ventricular systolic function is normal. The right ventricular size is normal. There is mildly elevated pulmonary artery systolic pressure.  3. Left atrial size was mild to moderately dilated. No left atrial/left atrial appendage thrombus was detected.  4. The mitral valve is normal in structure. Trivial mitral valve regurgitation.  5. The aortic valve is tricuspid. Aortic valve regurgitation is mild. Mild aortic valve sclerosis is present, with no evidence of aortic valve stenosis.  6. There is mild (Grade II) plaque involving the descending aorta.  7. Evidence of atrial level shunting detected by color flow Doppler. There is a small patent foramen ovale with predominantly left to right shunting across the atrial septum. Conclusion(s)/Recommendation(s): No evidence of vegetation/infective endocarditis on this transesophageal echocardiogram. FINDINGS  Left  Ventricle: Left ventricular ejection fraction, by estimation, is 25 to 30%. The left ventricle has severely decreased function. The left ventricle demonstrates global hypokinesis. The left ventricular internal cavity size was normal in size. There is no left ventricular hypertrophy. Right Ventricle: The right ventricular size is normal. No increase in right ventricular wall thickness. Right ventricular systolic function is normal. There is mildly elevated pulmonary artery systolic pressure. The tricuspid regurgitant velocity is 2.59  m/s, and with an assumed right atrial pressure of 10 mmHg, the estimated right ventricular systolic pressure is 03.4 mmHg. Left Atrium: Left atrial size was mild to moderately dilated. No left atrial/left atrial appendage thrombus was detected. Right Atrium: Right atrial size was normal in size. Pericardium: A small pericardial effusion is present. Mitral Valve: The mitral valve is normal in structure. Trivial mitral valve regurgitation. There is no evidence of mitral valve vegetation. Tricuspid Valve: The tricuspid valve is normal in structure. Tricuspid valve regurgitation is trivial. There is no evidence of tricuspid valve vegetation. Aortic Valve: The aortic valve is tricuspid. Aortic valve regurgitation is mild. Mild aortic valve sclerosis is present, with no evidence of aortic valve stenosis. There is no evidence of aortic valve vegetation. Pulmonic Valve: The pulmonic valve was grossly normal. Pulmonic valve regurgitation is trivial. There is no evidence of pulmonic valve vegetation. Aorta: The aortic root is normal in size and structure. There is mild (Grade II) plaque involving the descending aorta. IAS/Shunts: Evidence of atrial level shunting detected by color flow Doppler. A small patent foramen ovale is detected with predominantly left to right shunting across the atrial septum.  TRICUSPID VALVE TR Peak grad:   26.8 mmHg TR Vmax:        259.00 cm/s Buford Dresser MD  Electronically signed by Buford Dresser MD Signature Date/Time: 03/28/2020/2:52:28 PM    Final     ASSESSMENT AND PLAN: This is a very pleasant 74 years old African-American female with metastatic non-small cell lung cancer, adenocarcinoma with positive EGFR mutation status post several years  of treatment with Tarceva and the patient developed positive resistant mutation, T790M. She has been on treatment with Tagrisso for the last 5 years, status post 62 months of treatment.  She has been tolerating her treatment with Tagrisso well but she was recently admitted to [facility] with shortness of breath and she was diagnosed with congestive heart failure secondary to cardiomyopathy with ejection fraction of 20-25%.  This is likely secondary to her treatment with Tagrisso. I discontinued her treatment with Tagrisso on 02/13/2020. The patient is currently on treatment with Tarceva 150 mg p.o. daily started 2 months ago.  She continues to tolerate this treatment well with no concerning adverse effects. She had repeat CT scan of the chest, abdomen pelvis performed earlier today.  I personally and independently reviewed the scan images and discussed the results with the patient and her husband today. Based on my review I did not see any clear evidence for disease progression and the final report confirmed these findings. I recommended for the patient to continue her current treatment with Tarceva with the same dose. I will see her back for follow-up visit in 2 months for evaluation and repeat blood work. For the hypokalemia, she was advised to double her dose of potassium chloride for the next few days and then continue with the maintenance dose as previously ordered. For the anemia of chronic disease, she will continue her current treatment with oral iron tablet. The patient was advised to call immediately if she has any concerning symptoms in the interval. The patient voices understanding of  current disease status and treatment options and is in agreement with the current care plan. All questions were answered. The patient knows to call the clinic with any problems, questions or concerns. We can certainly see the patient much sooner if necessary.  Disclaimer: This note was dictated with voice recognition software. Similar sounding words can inadvertently be transcribed and may not be corrected upon review.

## 2020-04-27 ENCOUNTER — Telehealth: Payer: Medicare PPO

## 2020-04-27 ENCOUNTER — Ambulatory Visit: Payer: Self-pay

## 2020-04-27 DIAGNOSIS — C3411 Malignant neoplasm of upper lobe, right bronchus or lung: Secondary | ICD-10-CM

## 2020-04-27 DIAGNOSIS — R5381 Other malaise: Secondary | ICD-10-CM

## 2020-04-27 DIAGNOSIS — F0390 Unspecified dementia without behavioral disturbance: Secondary | ICD-10-CM

## 2020-04-27 NOTE — Chronic Care Management (AMB) (Signed)
Chronic Care Management    Social Work Follow Up Note  04/27/2020 Name: Valerie [name] MRN: [id number] DOB: 04-24-1946  Valerie [name] is a 74 y.o. year old female who is a primary care patient of [name], MD. The CCM team was consulted for assistance with care coordination.   Review of patient status, including review of consultants reports, other relevant assessments, and collaboration with appropriate care team members and the patient's provider was performed as part of comprehensive patient evaluation and provision of chronic care management services.    SW placed a third unsuccessful outbound call to the patient and her spouse to assist with care coordination needs.    Outpatient Encounter Medications as of 04/27/2020  Medication Sig Note  . ammonium lactate (LAC-HYDRIN) 12 % lotion Apply topically 2 (two) times daily. (Patient not taking: Reported on 03/23/2020)   . Ascorbic Acid (VITAMIN C) 100 MG tablet Take 100 mg by mouth every evening. Gummie   . atorvastatin (LIPITOR) 80 MG tablet Take 1 tablet (80 mg total) by mouth at bedtime.   . clopidogrel (PLAVIX) 75 MG tablet Take 1 tablet (75 mg total) by mouth daily with breakfast.   . erlotinib (TARCEVA) 150 MG tablet TAKE 1 TABLET (150 MG TOTAL) BY MOUTH DAILY. TAKE ON AN EMPTY STOMACH 1 HOUR BEFORE MEALS OR 2 HOURS AFTER.   [name] MAGNESIUM PO Take 1 tablet by mouth daily.    . metoprolol succinate (TOPROL-XL) 25 MG 24 hr tablet Take 1 tablet (25 mg total) by mouth daily.   . mometasone (ELOCON) 0.1 % cream Apply 1 application topically 2 (two) times daily.   . Multiple Vitamin (MULTI-VITAMINS) TABS Take 1 tablet by mouth every evening.    . polyethylene glycol (MIRALAX / GLYCOLAX) 17 g packet Take 17 g by mouth daily. (Patient taking differently: Take 17 g by mouth daily as needed for mild constipation. )   . potassium chloride SA (KLOR-CON) 20 MEQ tablet Take 2 tablets (40 mEq total) by mouth daily.   [name] saccharomyces  boulardii (FLORASTOR) 250 MG capsule Take 1 capsule (250 mg total) by mouth 2 (two) times daily.   . vitamin B-12 (CYANOCOBALAMIN) 1000 MCG tablet Take 1,000 mcg by mouth every evening.   [name] 20 MG TABS tablet Take 1 tablet (20 mg total) by mouth every evening. 02/15/2020: PT reported LD of 6/27 ~11AM   No facility-administered encounter medications on file as of 04/27/2020.     Goals Addressed              This Visit's Progress     Patient Stated   .  COMPLETED: "I want to be placed so I can walk again" (pt-stated)        CARE PLAN ENTRY (see longitudinal plan of care for additional care plan information)  Current Barriers:  . Inability to perform ADL's independently . Inability to perform IADL's independently . Wheelchair bound, has not walked in approximately 1 year . Chronic conditions including Dementia and Malignant Neoplasm of upper lobe of right lung  Social Work Clinical Goal(s):  [name] Over the next 90 days the patient will work with SW and primary care provider to determine if the patient is eligible for inpatient rehab to meet the patients goal to walk again  04/27/20- Closing referral due to inability to maintain patient contact after multiple attempts to contact the patient and her spouse.  CCM SW Interventions: Completed 04/01/20 with patient spouse, Valerie [name] . Successful outbound call to  Valerie [name] to follow up on patient care coordination needs . Determined the patient did not go to [facility] as previously planned due to inpatient hospitalization from 8/3 - 8/10 due to Hypokalemia and a MRSA infection o Follow up with [facility] is not planned at this time . Encouraged Mr. Dolle to follow up with embedded care management team as needed to assist with care coordination needs . Scheduled follow up over the next two weeks to assess placement needs  Completed 03/22/20 with patient spouse, Valerie [name] . Successful outbound call to Valerie [name] to  follow up on patient placement needs . Determined the patient has an appointment on 8/4 at 10:00 am with [facility] regarding placement to address rehab needs . Advised Valerie [name] SW will follow up on outcome of appointment to determine if SW assistance is needed . Scheduled follow up call on 03/24/20  Completed 03/15/20 with the patient . Inter-disciplinary care team collaboration (see longitudinal plan of care) . Successful outbound call placed to the patient to assess for care coordination needs . Determined the patient is wanting placement into [facility] in hopes of meeting goal to "walk again" . Assessed for patient interest in long-term placement versus acute placement o The patient stated "I want to be there until I can walk again" . Advised the patient SW would collaborate with her primary care provider regarding patient goal for placement . Collaboration with Dr. Baird Cancer to determine patient placement needs . Scheduled follow up call to the patient over the next two weeks  Patient Self Care Activities:  . Patient verbalizes understanding of plan to work with SW and primary care provider to determine placement needs . Calls pharmacy for medication refills . Supportive husband to assist with patient care needs . Unable to perform ADLs independently . Unable to perform IADLs independently  Please see past updates related to this goal by clicking on the "Past Updates" button in the selected goal          Follow Up Plan: No SW follow up planned at this time. The patient will remain active with RN Care Manager.   [name], BSW, CDP Social Worker, Certified Dementia Practitioner [facility] [phone]

## 2020-05-03 ENCOUNTER — Telehealth: Payer: Self-pay

## 2020-05-03 NOTE — Telephone Encounter (Signed)
I returned the pt's call and was unable to leave a message. I was calling the pt to see if she's contacted [facility] remote for evaluation of her nails because the pt left a message that she wanted the name of an ENT or Dermatologist because she said she is losing her nails.

## 2020-05-06 ENCOUNTER — Encounter: Payer: Self-pay | Admitting: Internal Medicine

## 2020-05-16 ENCOUNTER — Telehealth: Payer: Self-pay | Admitting: Internal Medicine

## 2020-05-16 NOTE — Telephone Encounter (Signed)
Called and spoke with patient. Confirmed upcoming patient

## 2020-05-17 ENCOUNTER — Ambulatory Visit: Payer: Medicare PPO | Attending: Orthopedic Surgery | Admitting: Physical Therapy

## 2020-05-17 ENCOUNTER — Encounter: Payer: Self-pay | Admitting: Physical Therapy

## 2020-05-17 ENCOUNTER — Other Ambulatory Visit: Payer: Self-pay

## 2020-05-17 DIAGNOSIS — R293 Abnormal posture: Secondary | ICD-10-CM | POA: Diagnosis not present

## 2020-05-17 DIAGNOSIS — M6281 Muscle weakness (generalized): Secondary | ICD-10-CM | POA: Diagnosis not present

## 2020-05-17 DIAGNOSIS — S72401A Unspecified fracture of lower end of right femur, initial encounter for closed fracture: Secondary | ICD-10-CM | POA: Diagnosis not present

## 2020-05-17 DIAGNOSIS — R2689 Other abnormalities of gait and mobility: Secondary | ICD-10-CM | POA: Diagnosis not present

## 2020-05-17 NOTE — Patient Instructions (Signed)
Access Code: [email or web address] URL: [email or web address] Date: 05/17/2020 Prepared by: Carolyne Littles  Program Notes  rock forward in the wheelchair8-10x try to stay loose.   Exercises .Seated Knee Extension with Resistance - 1 x daily - 6 x weekly - 3 sets - 10 reps .Seated Hamstring Curls with Resistance - 1 x daily - 6 x weekly - 3 sets - 10 reps .Seated Hip Abduction with Resistance - 1 x daily - 6 x weekly - 3 sets - 10 reps

## 2020-05-18 ENCOUNTER — Encounter: Payer: Self-pay | Admitting: Physical Therapy

## 2020-05-18 NOTE — Therapy (Signed)
[place], [place], [address] Phone: [phone]   Fax:  [phone]  Physical Therapy Evaluation  Patient Details  Name: Valerie [name] MRN: [id number] Date of Birth: Sep 15, 1945 Referring Provider (PT): Dr Edmonia Lynch    Encounter Date: 05/17/2020   PT End of Session - 05/17/20 1516    Visit Number 1    Number of Visits 24    Date for PT Re-Evaluation 08/09/20    Authorization Type mEDICARE 10 VISIT PROGRESS NOTE; kx AT 15    PT Start Time 1410    PT Stop Time 1458    PT Time Calculation (min) 48 min    Activity Tolerance Patient tolerated treatment well    Behavior During Therapy [facility] for tasks assessed/performed           Past Medical History:  Diagnosis Date  . Abnormal Pap smear 2006  . Anal fistula   . Ankle fracture   . Anxiety   . Arthritis   . Atrophic vaginitis 2008  . Cataract   . Dementia ([place]) 2009  . Dyspareunia 2008  . H/O osteoporosis   . H/O varicella   . H/O vitamin D deficiency   . Headache 07/24/2016  . Heart murmur   . History of measles, mumps, or rubella   . History of radiation therapy 07/28/13- 08/10/13   right lung metastasis 5000 cGy 10 sessions  . Hypertension   . Lung cancer ([place]) dx'd 2002  . Lung cancer ([place])   . Lung cancer ([place])   . Lung metastasis ([place])    PET scan 05/05/13, RUL lung nodule  . 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 Nodule of right lung CT- 06/03/12   RIGHT UPPER LOBE  . Nodule of right lung 06/03/12   Upper Lobe  . On antineoplastic chemotherapy    TARCEVA  . Osteoporosis 2010  . Primary cancer of right upper lobe of lung ([place]) 04/22/2009   Qualifier: Diagnosis of  By: Nils Pyle CMA ([place]), Mearl Latin    . Shortness of breath    hx lung ca   . Status post chemotherapy 2003   CARBOPLATIN/PACLITAXEL /STATUS POST CLINICAL TRAIL OF CELEBREX AND IRESSA AT [facility] FOR 1 YEAR  . Status post  radiation therapy 2003   LEFT LUNG  . Status post radiation therapy 11/07/2005   WHOLE BRAIN: DR Larkin [facility]  . Status post radiation therapy 06/02/2008   GAMMA KNIFE OF RESECTED CAVITAY  . Thyroid adenoma    ?  [name] Thyroid cancer ([place]) 10/18/11 bx   adenoid nodules   . Yeast infection     Past Surgical History:  Procedure Laterality Date  . BRAIN SURGERY    . INTRAVASCULAR ULTRASOUND/IVUS N/A 02/16/2020   Procedure: Intravascular Ultrasound/IVUS;  Surgeon: Leonie Man, MD;  Location: [facility];  Service: Cardiovascular;  Laterality: N/A;  . LEFT LOWER LOBECTOMY  05/2001  . LEFT OCCIPITAL CRANIOTOMY  09/21/2005   tumor  . LUNG LOBECTOMY    . NECK SURGERY    . ORIF ANKLE FRACTURE Left 10/02/2014   Procedure: OPEN REDUCTION INTERNAL FIXATION (ORIF) ANKLE FRACTURE;  Surgeon: Alta Corning, MD;  Location: [facility];  Service: Orthopedics;  Laterality: Left;  . ORIF FEMUR FRACTURE Right 06/11/2019   Procedure: OPEN REDUCTION INTERNAL FIXATION (ORIF) DISTAL FEMUR FRACTURE;  Surgeon: Renette Butters, MD;  Location: [place];  Service: Orthopedics;  Laterality:  Right;  [name] ORIF TIBIA PLATEAU Right 06/11/2019   Procedure: Open Reduction Internal Fixation (Orif) Tibial Plateau;  Surgeon: Renette Butters, MD;  Location: [place];  Service: Orthopedics;  Laterality: Right;  . RADICAL NECK DISSECTION  10/18/2011   Procedure: RADICAL NECK DISSECTION;  Surgeon: Izora Gala, MD;  Location: [place];  Service: ENT;  Laterality: Right;  RIGHT MODIFIED NECK DISSECTION /POSSIBLE RIGHT THYROIDECTOM  . RIGHT/LEFT HEART CATH AND CORONARY ANGIOGRAPHY N/A 02/16/2020   Procedure: RIGHT/LEFT HEART CATH AND CORONARY ANGIOGRAPHY;  Surgeon: Leonie Man, MD;  Location: [place] [facility];  Service: Cardiovascular;  Laterality: N/A;  . TEE WITHOUT CARDIOVERSION N/A 03/28/2020   Procedure: TRANSESOPHAGEAL ECHOCARDIOGRAM (TEE);  Surgeon: Buford Dresser, MD;  Location[facility];  Service: Cardiovascular;   Laterality: N/A;  . THYROIDECTOMY  10/18/2011   Procedure: THYROIDECTOMY WITH RADICAL NECK DISSECTION;  Surgeon: Izora Gala, MD;  Location: [place];  Service: ENT;  Laterality: Right;    There were no vitals filed for this visit.    Subjective Assessment - 05/17/20 1416    Subjective Patient had a fall and fracture of her tibaila plateau in March of 2020. She went to a nursing home and discharged after 20 days. Later that year she was re-admitted to the hospital and went back to a nursing home again for 20 days. She had home health PT which was limited 2nd to insurance. She was set to come to therapy but then was admitted to the hospital for CHF.    Pertinent History History of lung cancer for 18 years.    How long can you sit comfortably? can sit in a wheelchar without difficulty    How long can you stand comfortably? can not stand.    How long can you walk comfortably? can not walk    Diagnostic tests nothing recent    Patient Stated Goals to be able to walk to the bathroom    Currently in Pain? No/denies   Patient has a rash but no joing pain             [facility] PT Assessment - 05/18/20 0001      Assessment   Medical Diagnosis Tibial palteua fracture/ severe debility     Referring Provider (PT) Dr Edmonia Lynch     Onset Date/Surgical Date --   March 2021   Hand Dominance Right    Next MD Visit Nothing scheuled     Prior Therapy Had limited home health and nursing hhome rehab       Precautions   Precautions Fall    Precaution Comments had frequent flals when she was able totransfer       Restrictions   Weight Bearing Restrictions No      Balance Screen   Has the patient fallen in the past 6 months No   Patient raley transfers without a hoyer    Has the patient had a decrease in activity level because of a fear of falling?  No    Is the patient reluctant to leave their home because of a fear of falling?  No      Home Environment   Additional Comments ramp;  walk in shower        Prior Function   Level of Independence Needs assistance with transfers;Needs assistance with gait;Needs assistance with homemaking;Needs assistance with ADLs      Cognition   Overall Cognitive Status Within Functional Limits for tasks assessed    Attention Focused  Focused Attention Appears intact    Memory Appears intact    Awareness Appears intact    Problem Solving Appears intact      Observation/Other Assessments   Focus on Therapeutic Outcomes (FOTO)  Not appropraite       Sensation   Light Touch Appears Intact      Coordination   Gross Motor Movements are Fluid and Coordinated Yes    Fine Motor Movements are Fluid and Coordinated Yes      Posture/Postural Control   Posture Comments severe kyphosis of the spine sits with with slouched posture; right knee valgus; forward head       AROM   Overall AROM Comments mild limitations in right hip       PROM   Overall PROM Comments not tested. Patient not moved to prone 2nd to time       Strength   Right Hip Flexion 3+/5    Right Hip Internal Rotation 3+/5    Right Hip ABduction 3+/5    Left Hip Flexion 3+/5    Left Hip ABduction 3+/5    Left Hip ADduction 3+/5      Palpation   Palpation comment No tenderness to palpation       Transfers   Transfers Sit to Stand    Sit to Stand 2: Max assist    Sit to Stand Details (indicate cue type and reason) 3x with max a to tranfer. <5 sec onds of standing each trial with fatigue noted     Transfer via Lift Equipment Other/comments   II bars    Transfer Cueing wieght shifting forward; cuing to get feet under her to stand       Ambulation/Gait   Gait Comments unable to ambualte at this time.                       Objective measurements completed on examination: See above findings.       [facility] Adult PT Treatment/Exercise - 05/18/20 0001      Exercises   Exercises Knee/Hip      Knee/Hip Exercises: Seated   Long Arc Quad Limitations yellow x10 each  leg     Heel Slides Limitations hamstirng curl yellow x10 each leg     Other Seated Knee/Hip Exercises seated clamshell x20 yellow     Other Seated Knee/Hip Exercises rocking forward in the chair with cuing to bend at the waist. Patient advised to practivce this frequently in preperation for transfer training over the next few weeks.                   PT Education - 05/17/20 1422    Education Details expected progression of activity, POC, HEP, improtance of HEP    Person(s) Educated Patient    Methods Explanation;Demonstration;Tactile cues;Verbal cues;Handout    Comprehension Verbalized understanding;Returned demonstration            PT Short Term Goals - 05/17/20 1653      PT SHORT TERM GOAL #1   Title Patient will transfer sit to stand with min a    Baseline max a    Time 4    Period Weeks    Status New    Target Date 06/14/20      PT SHORT TERM GOAL #2   Title Patient will stand for 60 seconds without significant fatigue    Baseline 20 seconds    Time 6    Period Weeks  Status New    Target Date 06/28/20      PT SHORT TERM GOAL #3   Title Patient will use walker to take a step    Baseline can not take a step    Time 6    Period Weeks    Status New    Target Date 06/28/20             PT Long Term Goals - 05/17/20 1655      PT LONG TERM GOAL #1   Title Patient will ambualte 12' with mod A    Time 12    Period Weeks    Status New    Target Date 08/09/20      PT LONG TERM GOAL #2   Title Patient will trnasfer from her wheelchair to a regular chair with min gaurd with walker    Time 12    Period Weeks    Status New    Target Date 08/09/20      PT LONG TERM GOAL #3   Title Patient will stand for 5 minutes in order to perfrom ADL's at the sink    Time 12    Period Weeks    Status New    Target Date 08/09/20                  Plan - 05/17/20 1523    Clinical Impression Statement Patient is a 74 year old female S/P right tibial  platueau fx in [place] of 2020. She has been to rehab 2cx in theat time period but never regained her ability to ambualte. She is currently being treted with chemo pills for lumng caner. Her histroy of lung cancer has been long standing. She feels like the medication effects her stregnth and endurance.  She uses a hoyer lift for primary mobility and a wheelchair. She and her husband would like for her to be able to ambualte to the bathroom. She has significant postural abnormalities. She required max a to stand today. She was able to maintain standing with od a for < 7 min    Personal Factors and Comorbidities Comorbidity 1;Comorbidity 2;Comorbidity 3+    Comorbidities anxiety, congestive heart failure, active cancer, osteoperosis, arthritis,    Examination-Activity Limitations Bed Mobility;Bathing;Bend;Carry;Lift;Dressing;Locomotion Level;Squat;Stairs;Stand;Toileting;Transfers    Examination-Participation Restrictions Church;Community Activity;Cleaning;Laundry;Medication Management    Stability/Clinical Decision Making Unstable/Unpredictable   significant co-moridities including chemo effecting patiients strength and mobility   Clinical Decision Making High    Rehab Potential Fair   long standing inability to transfer   PT Frequency 2x / week    PT Duration 12 weeks    PT Treatment/Interventions ADLs/Self Care Home Management;Cryotherapy;Gait training;Stair training;Functional mobility training;Therapeutic activities;Therapeutic exercise;Neuromuscular re-education;Patient/family education;Manual techniques    PT Next Visit Plan continue to work on transfer training. May benefit from transfer to h-low table eventually; review UE and postrual exercises. Continue progress standing endrurance.    PT Home Exercise Plan laq, clamshells, hamstirng curls all reviewed with the patients husband as well; weight shifting in the chair for preperation to transfer    Consulted and Agree with Plan of Care Patient             Patient will benefit from skilled therapeutic intervention in order to improve the following deficits and impairments:  Abnormal gait, Decreased activity tolerance, Decreased endurance, Difficulty walking, Decreased mobility, Decreased strength, Pain, Postural dysfunction  Visit Diagnosis: Other abnormalities of gait and mobility  Muscle weakness (generalized)  Abnormal posture  Problem List Patient Active Problem List   Diagnosis Date Noted  . SVT (supraventricular tachycardia) ([place]) 03/26/2020  . Normocytic anemia 03/24/2020  . Urinary tract infection without hematuria   . AMS (altered mental status) 03/23/2020  . Hypokalemia 03/23/2020  . Prolonged Q-T interval on ECG 03/23/2020  . DNR (do not resuscitate) 03/23/2020  . MRSA bacteremia 03/23/2020  . Acute combined systolic and diastolic heart failure ([place])   . Elevated troponin   . Dilated cardiomyopathy ([place])   . Coronary artery disease involving native coronary artery of native heart without angina pectoris   . Congestive heart failure (CHF) ([place]) 02/14/2020  . Pyelonephritis 09/10/2019  . Acute blood loss anemia 06/12/2019  . Closed fracture of distal end of right femur, initial encounter ([place]) 06/11/2019  . Closed fracture of lateral portion of right tibial plateau 06/11/2019  . History of DVT of lower extremity 06/11/2019  . Blurry vision 01/06/2019  . Headache 07/24/2016  . Cough 08/24/2015  . Chronic diarrhea 08/24/2015  . Sepsis ([place])   . Encounter for antineoplastic chemotherapy 01/31/2015  . Urinary retention 10/20/2014  . Ileus ([place]) 10/20/2014  . Cellulitis of leg, right 10/20/2014  . Fracture of right proximal fibula   . Ankle fracture 10/02/2014  . Ankle fracture 10/02/2014  . Lung cancer ([place]) 10/02/2014  . Closed fibular fracture 10/02/2014  . Dementia ([place]) 10/02/2014  . Lower urinary tract infectious disease 10/02/2014  . Reactive airway disease 10/02/2014  . Fall 10/02/2014  .  Closed left ankle fracture 10/01/2014  . Right fibular fracture 10/01/2014  . Lung metastasis ([place])   . LESION, ANUS 04/26/2009  . DIARRHEA 04/26/2009  . Primary cancer of right upper lobe of lung ([place]) 04/22/2009  . Malignant neoplasm of brain ([place]) 04/22/2009  . VITAMIN D DEFICIENCY 04/22/2009  . ANAL FISTULA 04/22/2009  . OSTEOPOROSIS 04/22/2009    Carney Living PT DPT  05/18/2020, 10:28 AM  [facility] [address] [place], [place], [address] Phone: [phone]   Fax:  [phone]  Name: Valerie [name] MRN: [id number] Date of Birth: 1945-10-12

## 2020-05-19 ENCOUNTER — Telehealth: Payer: Self-pay

## 2020-05-19 NOTE — Telephone Encounter (Signed)
Voicemail inaudible. Attempted to call pt back and received a busy signal. I have spoken with pts husband and advised of pts next appt and arrival time.

## 2020-05-20 ENCOUNTER — Other Ambulatory Visit: Payer: Self-pay

## 2020-05-20 ENCOUNTER — Ambulatory Visit: Payer: Medicare PPO | Attending: Orthopedic Surgery | Admitting: Physical Therapy

## 2020-05-20 DIAGNOSIS — M6281 Muscle weakness (generalized): Secondary | ICD-10-CM | POA: Diagnosis not present

## 2020-05-20 DIAGNOSIS — R2689 Other abnormalities of gait and mobility: Secondary | ICD-10-CM | POA: Diagnosis not present

## 2020-05-20 DIAGNOSIS — R293 Abnormal posture: Secondary | ICD-10-CM

## 2020-05-20 NOTE — Therapy (Signed)
[place], [place], [address] Phone: [phone]   Fax:  [phone]  Physical Therapy Treatment  Patient Details  Name: Valerie [name] MRN: [id number] Date of Birth: 1946-06-20 Referring Provider (PT): Dr Edmonia Lynch    Encounter Date: 05/20/2020   PT End of Session - 05/20/20 1122    Visit Number 2    Number of Visits 24    Date for PT Re-Evaluation 08/09/20    Authorization Type mEDICARE 10 VISIT PROGRESS NOTE; kx AT 15    PT Start Time 3428    PT Stop Time 1114    PT Time Calculation (min) 39 min           Past Medical History:  Diagnosis Date  . Abnormal Pap smear 2006  . Anal fistula   . Ankle fracture   . Anxiety   . Arthritis   . Atrophic vaginitis 2008  . Cataract   . Dementia ([place]) 2009  . Dyspareunia 2008  . H/O osteoporosis   . H/O varicella   . H/O vitamin D deficiency   . Headache 07/24/2016  . Heart murmur   . History of measles, mumps, or rubella   . History of radiation therapy 07/28/13- 08/10/13   right lung metastasis 5000 cGy 10 sessions  . Hypertension   . Lung cancer ([place]) dx'd 2002  . Lung cancer ([place])   . Lung cancer ([place])   . Lung metastasis ([place])    PET scan 05/05/13, RUL lung nodule  . 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 Nodule of right lung CT- 06/03/12   RIGHT UPPER LOBE  . Nodule of right lung 06/03/12   Upper Lobe  . On antineoplastic chemotherapy    TARCEVA  . Osteoporosis 2010  . Primary cancer of right upper lobe of lung ([place]) 04/22/2009   Qualifier: Diagnosis of  By: Nils Pyle CMA ([place]), Mearl Latin    . Shortness of breath    hx lung ca   . Status post chemotherapy 2003   CARBOPLATIN/PACLITAXEL /STATUS POST CLINICAL TRAIL OF CELEBREX AND IRESSA AT [facility] FOR 1 YEAR  . Status post radiation therapy 2003   LEFT LUNG  . Status post radiation therapy 11/07/2005   WHOLE BRAIN: DR [facility]in [facility]  .  Status post radiation therapy 06/02/2008   GAMMA KNIFE OF RESECTED CAVITAY  . Thyroid adenoma    ?  [name] Thyroid cancer ([place]) 10/18/11 bx   adenoid nodules   . Yeast infection     Past Surgical History:  Procedure Laterality Date  . BRAIN SURGERY    . INTRAVASCULAR ULTRASOUND/IVUS N/A 02/16/2020   Procedure: Intravascular Ultrasound/IVUS;  Surgeon: Leonie Man, MD;  Location: [place] [facility];  Service: Cardiovascular;  Laterality: N/A;  . LEFT LOWER LOBECTOMY  05/2001  . LEFT OCCIPITAL CRANIOTOMY  09/21/2005   tumor  . LUNG LOBECTOMY    . NECK SURGERY    . ORIF ANKLE FRACTURE Left 10/02/2014   Procedure: OPEN REDUCTION INTERNAL FIXATION (ORIF) ANKLE FRACTURE;  Surgeon: Alta Corning, MD;  [facility]: [facility];  Service: Orthopedics;  Laterality: Left;  . ORIF FEMUR FRACTURE Right 06/11/2019   Procedure: OPEN REDUCTION INTERNAL FIXATION (ORIF) DISTAL FEMUR FRACTURE;  Surgeon: Renette Butters, MD;  Location: [place];  Service: Orthopedics;  Laterality: Right;  . ORIF TIBIA PLATEAU Right 06/11/2019   Procedure: Open Reduction Internal Fixation (Orif) Tibial Plateau;  Surgeon: Renette Butters, MD;  Location: [place];  Service: Orthopedics;  Laterality: Right;  . RADICAL NECK DISSECTION  10/18/2011   Procedure: RADICAL NECK DISSECTION;  Surgeon: Izora Gala, MD;  Location: [place];  Service: ENT;  Laterality: Right;  RIGHT MODIFIED NECK DISSECTION /POSSIBLE RIGHT THYROIDECTOM  . RIGHT/LEFT HEART CATH AND CORONARY ANGIOGRAPHY N/A 02/16/2020   Procedure: RIGHT/LEFT HEART CATH AND CORONARY ANGIOGRAPHY;  Surgeon: Leonie Man, MD;  Location: [place] [facility];  Service: Cardiovascular;  Laterality: N/A;  . TEE WITHOUT CARDIOVERSION N/A 03/28/2020   Procedure: TRANSESOPHAGEAL ECHOCARDIOGRAM (TEE);  Surgeon: Buford Dresser, MD;  Location: [facility];  Service: Cardiovascular;  Laterality: N/A;  . THYROIDECTOMY  10/18/2011   Procedure: THYROIDECTOMY WITH RADICAL NECK DISSECTION;  Surgeon:  Izora Gala, MD;  Location: [place];  Service: ENT;  Laterality: Right;    There were no vitals filed for this visit.                      [place] Adult PT Treatment/Exercise - 05/20/20 0001      Therapeutic Activites    Therapeutic Activities Other Therapeutic Activities    Other Therapeutic Activities transers x4 see assessment;  able to stand for <3 sec hold       Knee/Hip Exercises: Seated   Long Arc Quad Limitations yellow x10 each leg     Heel Slides Limitations hamstirng curl yellow x10 each leg     Other Seated Knee/Hip Exercises seated clamshell x20 yellow     Other Seated Knee/Hip Exercises rocking forward in the chair with cuing to bend at the waist. Patient advised to practivce this frequently in preperation for transfer training over the next few weeks.  Seated marching 2x10       Shoulder Exercises: Seated   Other Seated Exercises horizontal abduction 2x10 yellow; bilateral er 2x10 yellow; bilateral shoulder flexion with yellow 2x10                     PT Short Term Goals - 05/17/20 1653      PT SHORT TERM GOAL #1   Title Patient will transfer sit to stand with min a    Baseline max a    Time 4    Period Weeks    Status New    Target Date 06/14/20      PT SHORT TERM GOAL #2   Title Patient will stand for 60 seconds without significant fatigue    Baseline 20 seconds    Time 6    Period Weeks    Status New    Target Date 06/28/20      PT SHORT TERM GOAL #3   Title Patient will use walker to take a step    Baseline can not take a step    Time 6    Period Weeks    Status New    Target Date 06/28/20             PT Long Term Goals - 05/17/20 1655      PT LONG TERM GOAL #1   Title Patient will ambualte 38' with mod A    Time 12    Period Weeks    Status New    Target Date 08/09/20      PT LONG TERM GOAL #2   Title Patient will trnasfer from her wheelchair to a regular chair with min gaurd with walker    Time 12  Period  Weeks    Status New    Target Date 08/09/20      PT LONG TERM GOAL #3   Title Patient will stand for 5 minutes in order to perfrom ADL's at the sink    Time 12    Period Weeks    Status New    Target Date 08/09/20                 Plan - 05/20/20 1046    Clinical Impression Statement Patient was given a serioes of exercises for postural control and UE exercises. She tolerated well. She required some cuing. Her left arm is less effective with UE strengthening. She had a little more diffiuclty standing today then prior visit. She reported feeling a little weaker today. Therapy worked on getting her legs underneath her to be able to stand. She trialed standing 4 times. On the first trial she came to standing with mod a. The next to trials she was unable to conme to a standing position but therapy gave her more time and less assistance to see what she could do on her own. On the 4th trial she required max a but reported being very tired.    Personal Factors and Comorbidities Comorbidity 1;Comorbidity 2;Comorbidity 3+    Comorbidities anxiety, congestive heart failure, active cancer, osteoperosis, arthritis,    Examination-Activity Limitations Bed Mobility;Bathing;Bend;Carry;Lift;Dressing;Locomotion Level;Squat;Stairs;Stand;Toileting;Transfers    Examination-Participation Restrictions Church;Community Activity;Cleaning;Laundry;Medication Management    Stability/Clinical Decision Making Unstable/Unpredictable    Clinical Decision Making High    Rehab Potential Fair    PT Frequency 2x / week    PT Duration 12 weeks    PT Treatment/Interventions ADLs/Self Care Home Management;Cryotherapy;Gait training;Stair training;Functional mobility training;Therapeutic activities;Therapeutic exercise;Neuromuscular re-education;Patient/family education;Manual techniques    PT Next Visit Plan continue to work on transfer training. May benefit from transfer to h-low table eventually; review UE and postrual  exercises. Continue progress standing endrurance.    PT Home Exercise Plan laq, clamshells, hamstirng curls all reviewed with the patients husband as well; weight shifting in the chair for preperation to transfer    Consulted and Agree with Plan of Care Patient           Patient will benefit from skilled therapeutic intervention in order to improve the following deficits and impairments:  Abnormal gait, Decreased activity tolerance, Decreased endurance, Difficulty walking, Decreased mobility, Decreased strength, Pain, Postural dysfunction  Visit Diagnosis: Other abnormalities of gait and mobility  Muscle weakness (generalized)  Abnormal posture     Problem List Patient Active Problem List   Diagnosis Date Noted  . SVT (supraventricular tachycardia) ([place]) 03/26/2020  . Normocytic anemia 03/24/2020  . Urinary tract infection without hematuria   . AMS (altered mental status) 03/23/2020  . Hypokalemia 03/23/2020  . Prolonged Q-T interval on ECG 03/23/2020  . DNR (do not resuscitate) 03/23/2020  . MRSA bacteremia 03/23/2020  . Acute combined systolic and diastolic heart failure ([place])   . Elevated troponin   . Dilated cardiomyopathy ([place])   . Coronary artery disease involving native coronary artery of native heart without angina pectoris   . Congestive heart failure (CHF) ([place]) 02/14/2020  . Pyelonephritis 09/10/2019  . Acute blood loss anemia 06/12/2019  . Closed fracture of distal end of right femur, initial encounter ([place]) 06/11/2019  . Closed fracture of lateral portion of right tibial plateau 06/11/2019  . History of DVT of lower extremity 06/11/2019  . Blurry vision 01/06/2019  . Headache 07/24/2016  . Cough 08/24/2015  .  Chronic diarrhea 08/24/2015  . Sepsis ([place])   . Encounter for antineoplastic chemotherapy 01/31/2015  . Urinary retention 10/20/2014  . Ileus ([place]) 10/20/2014  . Cellulitis of leg, right 10/20/2014  . Fracture of right proximal fibula   . Ankle  fracture 10/02/2014  . Ankle fracture 10/02/2014  . Lung cancer ([place]) 10/02/2014  . Closed fibular fracture 10/02/2014  . Dementia ([place]) 10/02/2014  . Lower urinary tract infectious disease 10/02/2014  . Reactive airway disease 10/02/2014  . Fall 10/02/2014  . Closed left ankle fracture 10/01/2014  . Right fibular fracture 10/01/2014  . Lung metastasis ([place])   . LESION, ANUS 04/26/2009  . DIARRHEA 04/26/2009  . Primary cancer of right upper lobe of lung ([place]) 04/22/2009  . Malignant neoplasm of brain ([place]) 04/22/2009  . VITAMIN D DEFICIENCY 04/22/2009  . ANAL FISTULA 04/22/2009  . OSTEOPOROSIS 04/22/2009    Carney Living PT DPT  05/20/2020, 11:24 AM  [facility] [address] [place], [place], [address] Phone: [phone]   Fax:  [phone]  Name: RUE VALLADARES MRN: [id number] Date of Birth: [birth year]

## 2020-05-22 DIAGNOSIS — R54 Age-related physical debility: Secondary | ICD-10-CM | POA: Diagnosis not present

## 2020-05-23 MED FILL — ERLOTINIB HCL 150 MG TABS: 150 | 30 days supply | Qty: 30 | Fill #1

## 2020-05-24 ENCOUNTER — Telehealth (INDEPENDENT_AMBULATORY_CARE_PROVIDER_SITE_OTHER): Payer: Medicare PPO | Admitting: Cardiology

## 2020-05-24 ENCOUNTER — Encounter: Payer: Self-pay | Admitting: Cardiology

## 2020-05-24 DIAGNOSIS — Z7401 Bed confinement status: Secondary | ICD-10-CM

## 2020-05-24 DIAGNOSIS — I420 Dilated cardiomyopathy: Secondary | ICD-10-CM | POA: Diagnosis not present

## 2020-05-24 DIAGNOSIS — I5042 Chronic combined systolic (congestive) and diastolic (congestive) heart failure: Secondary | ICD-10-CM

## 2020-05-24 DIAGNOSIS — I2510 Atherosclerotic heart disease of native coronary artery without angina pectoris: Secondary | ICD-10-CM | POA: Diagnosis not present

## 2020-05-24 MED ORDER — CLOPIDOGREL BISULFATE 75 MG PO TABS: 75.0000 mg | ORAL_TABLET | Freq: Every day | ORAL | 3 refills | Status: DC

## 2020-05-24 MED ORDER — METOPROLOL SUCCINATE ER 25 MG PO TB24: 25.0000 mg | ORAL_TABLET | Freq: Every day | ORAL | 3 refills | Status: DC

## 2020-05-24 NOTE — Progress Notes (Signed)
Virtual Visit via Telephone Note   This visit type was conducted due to national recommendations for restrictions regarding the COVID-19 Pandemic (e.g. social distancing) in an effort to limit this patient's exposure and mitigate transmission in our community.  Due to her co-morbid illnesses, this patient is at least at moderate risk for complications without adequate follow up.  This format is felt to be most appropriate for this patient at this time.  The patient did not have access to video technology/had technical difficulties with video requiring transitioning to audio format only (telephone).  All issues noted in this document were discussed and addressed.  No physical exam could be performed with this format.  Please refer to the patient's chart for her  consent to telehealth for [facility]    Date:  05/24/2020   ID:  Valerie [name], DOB [birth year], MRN [id number] The patient was identified using 2 identifiers.  Patient Location: Home Provider Location: Home Office  PCP:  [name], MD  Cardiologist:  Buford Dresser, MD  Electrophysiologist:  None   Evaluation Performed:  Follow-Up Visit  Chief Complaint:  Follow up  History of Present Illness:    Valerie [name] is a 74 y.o. female with a PMH of metastatic NSCLC (diagnosed 50 yr ago, on oral chemo x10 years), history of DVT on anticoagulation, bedbound since 10/2020following a LLE fracture, found to have new nonischemic cardiomyopathy 01/2020.  The patient does not have symptoms concerning for COVID-19 infection (fever, chills, cough, or new shortness of breath).   Today: Husband (retired Engineer, drilling) on call today as well. She has been feeling very fatigued since restarting the tarceva chemotherapy. Has a home nurse visit once/week. Last BP 109/69. No chest pain, shortness of breath, or LE edema. Lies nearly flat in bed without issues. No recent weights.  We discussed that typically we would recheck  an echo at this point to see if EF is improving. If not improved, we sometimes recommend ICD. I discussed that I am concerned about ICD for her, given frailty and history of PVCs/aberrancy, that she may be inappropriately shocked. We discussed echocardiogram, see below.  No fevers/chills. No issues with bleeding, tolerating clopidogrel and rivaroxaban.  Past Medical History:  Diagnosis Date  . Abnormal Pap smear 2006  . Anal fistula   . Ankle fracture   . Anxiety   . Arthritis   . Atrophic vaginitis 2008  . Cataract   . Dementia ([place]) 2009  . Dyspareunia 2008  . H/O osteoporosis   . H/O varicella   . H/O vitamin D deficiency   . Headache 07/24/2016  . Heart murmur   . History of measles, mumps, or rubella   . History of radiation therapy 07/28/13- 08/10/13   right lung metastasis 5000 cGy 10 sessions  . Hypertension   . Lung cancer ([place]) dx'd 2002  . Lung cancer ([place])   . Lung cancer ([place])   . Lung metastasis ([place])    PET scan 05/05/13, RUL lung nodule  . 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 Nodule of right lung CT- 06/03/12   RIGHT UPPER LOBE  . Nodule of right lung 06/03/12   Upper Lobe  . On antineoplastic chemotherapy    TARCEVA  . Osteoporosis 2010  . Primary cancer of right upper lobe of lung ([place]) 04/22/2009   Qualifier: Diagnosis of  By: Nils Pyle CMA ([place]), Mearl Latin    . Shortness of breath  hx lung ca   . Status post chemotherapy 2003   CARBOPLATIN/PACLITAXEL /STATUS POST CLINICAL TRAIL OF CELEBREX AND IRESSA AT [facility] FOR 1 YEAR  . Status post radiation therapy 2003   LEFT LUNG  . Status post radiation therapy 11/07/2005   WHOLE BRAIN: DR [facility]arkin [facility]  . Status post radiation therapy 06/02/2008   GAMMA KNIFE OF RESECTED CAVITAY  . Thyroid adenoma    ?  [name] Thyroid cancer ([place]) 10/18/11 bx   adenoid nodules   . Yeast infection    Past Surgical History:  Procedure Laterality Date  . BRAIN SURGERY    .  INTRAVASCULAR ULTRASOUND/IVUS N/A 02/16/2020   Procedure: Intravascular Ultrasound/IVUS;  Surgeon: Leonie Man, MD;  Location: [place] [facility];  Service: Cardiovascular;  Laterality: N/A;  . LEFT LOWER LOBECTOMY  05/2001  . LEFT OCCIPITAL CRANIOTOMY  09/21/2005   tumor  . LUNG LOBECTOMY    . NECK SURGERY    . ORIF ANKLE FRACTURE Left 10/02/2014   Procedure: OPEN REDUCTION INTERNAL FIXATION (ORIF) ANKLE FRACTURE;  Surgeon: Alta Corning, MD;  [facility];  Service: Orthopedics;  Laterality: Left;  . ORIF FEMUR FRACTURE Right 06/11/2019   Procedure: OPEN REDUCTION INTERNAL FIXATION (ORIF) DISTAL FEMUR FRACTURE;  Surgeon: Renette Butters, MD;  Location: [place];  Service: Orthopedics;  Laterality: Right;  . ORIF TIBIA PLATEAU Right 06/11/2019   Procedure: Open Reduction Internal Fixation (Orif) Tibial Plateau;  Surgeon: Renette Butters, MD;  Location: [place];  Service: Orthopedics;  Laterality: Right;  . RADICAL NECK DISSECTION  10/18/2011   Procedure: RADICAL NECK DISSECTION;  Surgeon: Izora Gala, MD;  Location: [place];  Service: ENT;  Laterality: Right;  RIGHT MODIFIED NECK DISSECTION /POSSIBLE RIGHT THYROIDECTOM  . RIGHT/LEFT HEART CATH AND CORONARY ANGIOGRAPHY N/A 02/16/2020   Procedure: RIGHT/LEFT HEART CATH AND CORONARY ANGIOGRAPHY;  Surgeon: Leonie Man, MD;  Location: [place] [facility];  Service: Cardiovascular;  Laterality: N/A;  . TEE WITHOUT CARDIOVERSION N/A 03/28/2020   Procedure: TRANSESOPHAGEAL ECHOCARDIOGRAM (TEE);  Surgeon: Buford Dresser, MD;  Location[facility];  Service: Cardiovascular;  Laterality: N/A;  . THYROIDECTOMY  10/18/2011   Procedure: THYROIDECTOMY WITH RADICAL NECK DISSECTION;  Surgeon: Izora Gala, MD;  Location: [place];  Service: ENT;  Laterality: Right;     Current Meds  Medication Sig  . Ascorbic Acid (VITAMIN C) 100 MG tablet Take 100 mg by mouth every evening. Gummie  . atorvastatin (LIPITOR) 80 MG tablet Take 1 tablet (80 mg total)  by mouth at bedtime.  . clopidogrel (PLAVIX) 75 MG tablet Take 1 tablet (75 mg total) by mouth daily with breakfast.  . donepezil (ARICEPT) 10 MG tablet Take 10 mg by mouth at bedtime.  . erlotinib (TARCEVA) 150 MG tablet TAKE 1 TABLET (150 MG TOTAL) BY MOUTH DAILY. TAKE ON AN EMPTY STOMACH 1 HOUR BEFORE MEALS OR 2 HOURS AFTER.  . metoprolol succinate (TOPROL-XL) 25 MG 24 hr tablet Take 1 tablet (25 mg total) by mouth daily.  . Multiple Vitamin (MULTI-VITAMINS) TABS Take 1 tablet by mouth every evening.   . polyethylene glycol (MIRALAX / GLYCOLAX) 17 g packet Take 17 g by mouth daily as needed.  . potassium chloride SA (KLOR-CON) 20 MEQ tablet Take 20 mEq by mouth daily.  [name] saccharomyces boulardii (FLORASTOR) 250 MG capsule Take 1 capsule (250 mg total) by mouth 2 (two) times daily.  . vitamin B-12 (CYANOCOBALAMIN) 1000 MCG tablet Take 1,000 mcg by mouth every evening.  [name]  XARELTO 20 MG TABS tablet Take 1 tablet (20 mg total) by mouth every evening.  . [DISCONTINUED] clopidogrel (PLAVIX) 75 MG tablet Take 1 tablet (75 mg total) by mouth daily with breakfast.  . [DISCONTINUED] metoprolol succinate (TOPROL-XL) 25 MG 24 hr tablet Take 1 tablet (25 mg total) by mouth daily.     Allergies:   Keflex [cephalexin]   Social History   Tobacco Use  . Smoking status: Never Smoker  . Smokeless tobacco: Never Used  . Tobacco comment: smoked few years in college  Vaping Use  . Vaping Use: Never used  Substance Use Topics  . Alcohol use: No  . Drug use: No     Family Hx: The patient's family history includes Cancer in her father, maternal aunt, and mother; Gait disorder in her mother; Heart failure in her sister.  ROS:   Please see the history of present illness.    All other systems reviewed and are negative.   Prior CV studies:   The following studies were reviewed today:  Echocardiogram 02/15/20: 1. Left ventricular ejection fraction, by estimation, is 20 to 25%. The  left ventricle has  severely decreased function. The left ventricle  demonstrates global hypokinesis. Left ventricular diastolic parameters are  consistent with Grade I diastolic  dysfunction (impaired relaxation).  2. Right ventricular systolic function is normal. The right ventricular  size is normal. There is mildly elevated pulmonary artery systolic  pressure.  3. The mitral valve is abnormal. Mild to moderate mitral valve  regurgitation.  4. The aortic valve is grossly normal. Aortic valve regurgitation is  mild.  5. The inferior vena cava is dilated in size with >50% respiratory  variability, suggesting right atrial pressure of 8 mmHg.   Right/left heart catheterization 02/16/20:  Hemodynamic findings consistent with mild (borderline) pulmonary hypertension.  LV end diastolic pressure is normal.  Ost Cx to Prox Cx lesion is 55% stenosed. -> Somewhat ulcerated mixed plaque on IVUS-estimated 50-60%, not flow-limiting by FFR (0.98),  Ost LAD to Prox LAD lesion is 30% stenosed.  Prox RCA lesion is 20% stenosed.  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  Right heart cath pressures show PCWP 18 mmHg with V wave of 24 mmHg from MR, LVEDP 14 mmHg. PA diastolic 26 mm;  Cardiac Output-Index: 6.88-3.8  Labs/Other Tests and Data Reviewed:    EKG:  An ECG dated 03/26/20 was personally reviewed today and demonstrated:  sinus rhythm, short PR, nonspecific ST changes  Recent Labs: 02/14/2020: B Natriuretic Peptide 985.5 02/15/2020: TSH 2.095 03/27/2020: Magnesium 2.2 04/26/2020: ALT 10; BUN 17; Creatinine 0.92; Hemoglobin 11.6; Platelet Count 309; Potassium 3.2; Sodium 139   Recent Lipid Panel Lab Results  Component Value  Date/Time   CHOL 189 04/20/2020 12:00 AM   CHOL 263 (H) 10/16/2018 01:00 PM   TRIG 130 04/20/2020 12:00 AM   HDL 69 04/20/2020 12:00 AM   HDL 90 10/16/2018 01:00 PM   CHOLHDL 3.6 02/15/2020 05:50 AM   LDLCALC 97 04/20/2020 12:00 AM   LDLCALC 143 (H) 10/16/2018 01:00 PM    Wt Readings from Last 3 Encounters:  03/23/20 153 lb 12.8 oz (69.8 kg)  02/24/20 163 lb (73.9 kg)  02/23/20 163 lb (73.9 kg)     Objective:  Vital Signs:  There were no vitals taken for this visit.   Speaking comfortably on the phone, no audible wheezing In no acute distress Alert and oriented Normal affect Normal speech  ASSESSMENT & PLAN:    Nonischemic cardiomyopathy: -tagrisso stopped per oncology, now on tarceva. Having fatigue with this -unable to use ACEi/ARB/ARNI/MRA due to hypotension -tolerating metoprolol succinate 25 mg daily, refilled today -euvolemic per report -discussed repeat echo. After shared decision making, they would like to hold on echo at this time. Will re-evaluate in 3 mos. -I am concerned about an ICD in her. She is bedbound chronically and has history of PVC with aberrancy. Given her overall frailty and risk of inappropriate shocks, I do not think she is a good candidate for ICD.  Nonobstructive CAD: -will continue clopidogrel for at least 6 mos given findings of ulcerated plaque on cath, and continue rivaroxaban for DVT history -continue atorvastatin 80 mg daily  COVID-19 Education: The signs and symptoms of COVID-19 were discussed with the patient and how to seek care for testing (follow up with PCP or arrange E-visit).  The importance of social distancing was discussed today.  Time:   Today, I have spent 12 minutes with the patient with telehealth technology discussing the above problems.  Additional time spent in chart review and documentation.  Medication Adjustments/Labs and Tests Ordered: Current medicines are reviewed at length with the patient today.  Concerns  regarding medicines are outlined above.   Tests Ordered: No orders of the defined types were placed in this encounter.   Medication Changes: Meds ordered this encounter  Medications  . metoprolol succinate (TOPROL-XL) 25 MG 24 hr tablet    Sig: Take 1 tablet (25 mg total) by mouth daily.    Dispense:  90 tablet    Refill:  3  . clopidogrel (PLAVIX) 75 MG tablet    Sig: Take 1 tablet (75 mg total) by mouth daily with breakfast.    Dispense:  90 tablet    Refill:  3    Follow Up:  3 mos  Signed, Buford Dresser, MD  05/24/2020 10:13 AM    [place]

## 2020-05-24 NOTE — Patient Instructions (Signed)
Medication Instructions:  Your Physician recommend you continue on your current medication as directed.    *If you need a refill on your cardiac medications before your next appointment, please call your pharmacy*   Lab Work: None ordered   Testing/Procedures: None ordered    Follow-Up: At [facility], you and your health needs are our priority.  As part of our continuing mission to provide you with exceptional heart care, we have created designated Provider Care Teams.  These Care Teams include your primary Cardiologist (physician) and Advanced Practice Providers (APPs -  Physician Assistants and Nurse Practitioners) who all work together to provide you with the care you need, when you need it.  We recommend signing up for the patient portal called "MyChart".  Sign up information is provided on this After Visit Summary.  MyChart is used to connect with patients for Virtual Visits (Telemedicine).  Patients are able to view lab/test results, encounter notes, upcoming appointments, etc.  Non-urgent messages can be sent to your provider as well.   To learn more about what you can do with MyChart, go to [email or web address].    Your next appointment:   3 month(s)  The format for your next appointment:   In Person  Provider:   Buford Dresser, MD

## 2020-05-25 DIAGNOSIS — H6123 Impacted cerumen, bilateral: Secondary | ICD-10-CM | POA: Diagnosis not present

## 2020-05-26 ENCOUNTER — Telehealth: Payer: Self-pay

## 2020-05-26 ENCOUNTER — Telehealth: Payer: Medicare PPO

## 2020-05-26 ENCOUNTER — Ambulatory Visit: Payer: Medicare PPO

## 2020-05-26 NOTE — Telephone Encounter (Signed)
This nurse called patient in order to perform telephone AWV. Patient stated that she was not feeling up to it and that she might go to the hospital because she has not been eating. States will schedule at another time.

## 2020-05-30 ENCOUNTER — Other Ambulatory Visit: Payer: Self-pay | Admitting: Internal Medicine

## 2020-05-30 ENCOUNTER — Telehealth: Payer: Self-pay

## 2020-05-30 MED ORDER — PROCHLORPERAZINE MALEATE 10 MG PO TABS
10.0000 mg | ORAL_TABLET | Freq: Four times a day (QID) | ORAL | 0 refills | Status: DC | PRN
Start: 2020-05-30 — End: 2023-07-17

## 2020-05-30 NOTE — Telephone Encounter (Signed)
-----   Message from Curt Bears, MD sent at 05/30/2020  3:14 PM EDT ----- I sent a prescription of Compazine to her pharmacy.  Thank you. ----- Message ----- From: Tami Lin, RN Sent: 05/30/2020   2:38 PM EDT To: Curt Bears, MD  Patient's husband called and stated patient is experiencing increased nausea and diarrhea since switching from [place] to [place]. Patient's husband wants to know if patient can receive a prescription for nausea medicine. Next appointment is 06/28/20. Lanelle Bal, RN

## 2020-05-30 NOTE — Telephone Encounter (Signed)
Patient's husband made aware a prescription for Compazine has been sent to the pharmacy. He verbalized understanding.

## 2020-05-31 ENCOUNTER — Other Ambulatory Visit: Payer: Self-pay

## 2020-05-31 ENCOUNTER — Ambulatory Visit: Payer: Medicare PPO | Admitting: Physical Therapy

## 2020-05-31 DIAGNOSIS — Y929 Unspecified place or not applicable: Secondary | ICD-10-CM | POA: Diagnosis not present

## 2020-05-31 DIAGNOSIS — Z9221 Personal history of antineoplastic chemotherapy: Secondary | ICD-10-CM | POA: Diagnosis not present

## 2020-05-31 DIAGNOSIS — R404 Transient alteration of awareness: Secondary | ICD-10-CM | POA: Diagnosis not present

## 2020-05-31 DIAGNOSIS — R079 Chest pain, unspecified: Secondary | ICD-10-CM | POA: Diagnosis not present

## 2020-05-31 DIAGNOSIS — C799 Secondary malignant neoplasm of unspecified site: Secondary | ICD-10-CM | POA: Diagnosis not present

## 2020-05-31 DIAGNOSIS — I480 Paroxysmal atrial fibrillation: Secondary | ICD-10-CM | POA: Diagnosis present

## 2020-05-31 DIAGNOSIS — J90 Pleural effusion, not elsewhere classified: Secondary | ICD-10-CM | POA: Diagnosis not present

## 2020-05-31 DIAGNOSIS — Z20822 Contact with and (suspected) exposure to covid-19: Secondary | ICD-10-CM | POA: Diagnosis present

## 2020-05-31 DIAGNOSIS — E876 Hypokalemia: Secondary | ICD-10-CM | POA: Diagnosis not present

## 2020-05-31 DIAGNOSIS — R0602 Shortness of breath: Secondary | ICD-10-CM | POA: Diagnosis not present

## 2020-05-31 DIAGNOSIS — I420 Dilated cardiomyopathy: Secondary | ICD-10-CM | POA: Diagnosis present

## 2020-05-31 DIAGNOSIS — M2550 Pain in unspecified joint: Secondary | ICD-10-CM | POA: Diagnosis not present

## 2020-05-31 DIAGNOSIS — M6281 Muscle weakness (generalized): Secondary | ICD-10-CM | POA: Diagnosis not present

## 2020-05-31 DIAGNOSIS — L03312 Cellulitis of back [any part except buttock]: Secondary | ICD-10-CM | POA: Diagnosis present

## 2020-05-31 DIAGNOSIS — R42 Dizziness and giddiness: Secondary | ICD-10-CM | POA: Diagnosis not present

## 2020-05-31 DIAGNOSIS — R609 Edema, unspecified: Secondary | ICD-10-CM | POA: Diagnosis not present

## 2020-05-31 DIAGNOSIS — I110 Hypertensive heart disease with heart failure: Secondary | ICD-10-CM | POA: Diagnosis present

## 2020-05-31 DIAGNOSIS — R52 Pain, unspecified: Secondary | ICD-10-CM | POA: Diagnosis not present

## 2020-05-31 DIAGNOSIS — R8271 Bacteriuria: Secondary | ICD-10-CM | POA: Diagnosis not present

## 2020-05-31 DIAGNOSIS — R2689 Other abnormalities of gait and mobility: Secondary | ICD-10-CM

## 2020-05-31 DIAGNOSIS — A419 Sepsis, unspecified organism: Secondary | ICD-10-CM | POA: Diagnosis present

## 2020-05-31 DIAGNOSIS — L0390 Cellulitis, unspecified: Secondary | ICD-10-CM | POA: Diagnosis present

## 2020-05-31 DIAGNOSIS — Z923 Personal history of irradiation: Secondary | ICD-10-CM | POA: Diagnosis not present

## 2020-05-31 DIAGNOSIS — I517 Cardiomegaly: Secondary | ICD-10-CM | POA: Diagnosis not present

## 2020-05-31 DIAGNOSIS — I447 Left bundle-branch block, unspecified: Secondary | ICD-10-CM | POA: Diagnosis not present

## 2020-05-31 DIAGNOSIS — R21 Rash and other nonspecific skin eruption: Secondary | ICD-10-CM | POA: Diagnosis not present

## 2020-05-31 DIAGNOSIS — F0390 Unspecified dementia without behavioral disturbance: Secondary | ICD-10-CM | POA: Diagnosis not present

## 2020-05-31 DIAGNOSIS — R410 Disorientation, unspecified: Secondary | ICD-10-CM | POA: Diagnosis not present

## 2020-05-31 DIAGNOSIS — I4891 Unspecified atrial fibrillation: Secondary | ICD-10-CM | POA: Diagnosis not present

## 2020-05-31 DIAGNOSIS — Z6820 Body mass index (BMI) 20.0-20.9, adult: Secondary | ICD-10-CM | POA: Diagnosis not present

## 2020-05-31 DIAGNOSIS — R0902 Hypoxemia: Secondary | ICD-10-CM | POA: Diagnosis not present

## 2020-05-31 DIAGNOSIS — T451X5A Adverse effect of antineoplastic and immunosuppressive drugs, initial encounter: Secondary | ICD-10-CM | POA: Diagnosis present

## 2020-05-31 DIAGNOSIS — Z7901 Long term (current) use of anticoagulants: Secondary | ICD-10-CM | POA: Diagnosis not present

## 2020-05-31 DIAGNOSIS — R112 Nausea with vomiting, unspecified: Secondary | ICD-10-CM | POA: Diagnosis present

## 2020-05-31 DIAGNOSIS — E440 Moderate protein-calorie malnutrition: Secondary | ICD-10-CM | POA: Diagnosis present

## 2020-05-31 DIAGNOSIS — Z66 Do not resuscitate: Secondary | ICD-10-CM | POA: Diagnosis present

## 2020-05-31 DIAGNOSIS — Z86718 Personal history of other venous thrombosis and embolism: Secondary | ICD-10-CM | POA: Diagnosis not present

## 2020-05-31 DIAGNOSIS — R109 Unspecified abdominal pain: Secondary | ICD-10-CM | POA: Diagnosis not present

## 2020-05-31 DIAGNOSIS — C7989 Secondary malignant neoplasm of other specified sites: Secondary | ICD-10-CM | POA: Diagnosis present

## 2020-05-31 DIAGNOSIS — I5042 Chronic combined systolic (congestive) and diastolic (congestive) heart failure: Secondary | ICD-10-CM | POA: Diagnosis present

## 2020-05-31 DIAGNOSIS — L270 Generalized skin eruption due to drugs and medicaments taken internally: Secondary | ICD-10-CM | POA: Diagnosis present

## 2020-05-31 DIAGNOSIS — C7931 Secondary malignant neoplasm of brain: Secondary | ICD-10-CM | POA: Diagnosis present

## 2020-05-31 DIAGNOSIS — I10 Essential (primary) hypertension: Secondary | ICD-10-CM | POA: Diagnosis not present

## 2020-05-31 DIAGNOSIS — Z7401 Bed confinement status: Secondary | ICD-10-CM | POA: Diagnosis not present

## 2020-05-31 DIAGNOSIS — R627 Adult failure to thrive: Secondary | ICD-10-CM | POA: Diagnosis present

## 2020-05-31 DIAGNOSIS — Z743 Need for continuous supervision: Secondary | ICD-10-CM | POA: Diagnosis not present

## 2020-05-31 DIAGNOSIS — M810 Age-related osteoporosis without current pathological fracture: Secondary | ICD-10-CM | POA: Diagnosis present

## 2020-05-31 DIAGNOSIS — Z8614 Personal history of Methicillin resistant Staphylococcus aureus infection: Secondary | ICD-10-CM | POA: Diagnosis not present

## 2020-05-31 DIAGNOSIS — R293 Abnormal posture: Secondary | ICD-10-CM

## 2020-05-31 DIAGNOSIS — C3411 Malignant neoplasm of upper lobe, right bronchus or lung: Secondary | ICD-10-CM | POA: Diagnosis present

## 2020-06-01 ENCOUNTER — Encounter: Payer: Self-pay | Admitting: Physical Therapy

## 2020-06-01 NOTE — Therapy (Addendum)
[place], [place], [address] Phone: [phone]   Fax:  [phone]  Physical Therapy Treatment/Discharge  Patient Details  Name: Valerie [name] MRN: [id number] Date of Birth: 1946/05/25 Referring Provider (PT): Dr Edmonia Lynch    Encounter Date: 05/31/2020   PT End of Session - 06/01/20 0951    Visit Number 3    Number of Visits 24    Date for PT Re-Evaluation 08/09/20    Authorization Type mEDICARE 10 VISIT PROGRESS NOTE; kx AT 15    PT Start Time 0300    PT Stop Time 0340    PT Time Calculation (min) 40 min    Activity Tolerance Patient tolerated treatment well    Behavior During Therapy [facility] for tasks assessed/performed           Past Medical History:  Diagnosis Date  . Abnormal Pap smear 2006  . Anal fistula   . Ankle fracture   . Anxiety   . Arthritis   . Atrophic vaginitis 2008  . Cataract   . Dementia ([place]) 2009  . Dyspareunia 2008  . H/O osteoporosis   . H/O varicella   . H/O vitamin D deficiency   . Headache 07/24/2016  . Heart murmur   . History of measles, mumps, or rubella   . History of radiation therapy 07/28/13- 08/10/13   right lung metastasis 5000 cGy 10 sessions  . Hypertension   . Lung cancer ([place]) dx'd 2002  . Lung cancer ([place])   . Lung cancer ([place])   . Lung metastasis ([place])    PET scan 05/05/13, RUL lung nodule  . 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 Nodule of right lung CT- 06/03/12   RIGHT UPPER LOBE  . Nodule of right lung 06/03/12   Upper Lobe  . On antineoplastic chemotherapy    TARCEVA  . Osteoporosis 2010  . Primary cancer of right upper lobe of lung ([place]) 04/22/2009   Qualifier: Diagnosis of  By: Nils Pyle CMA ([place]), Mearl Latin    . Shortness of breath    hx lung ca   . Status post chemotherapy 2003   CARBOPLATIN/PACLITAXEL /STATUS POST CLINICAL TRAIL OF CELEBREX AND IRESSA AT [facility] FOR 1 YEAR  .  Status post radiation therapy 2003   LEFT LUNG  . Status post radiation therapy 11/07/2005   WHOLE BRAIN: DR Larkin [facility]  . Status post radiation therapy 06/02/2008   GAMMA KNIFE OF RESECTED CAVITAY  . Thyroid adenoma    ?  [name] Thyroid cancer ([place]) 10/18/11 bx   adenoid nodules   . Yeast infection     Past Surgical History:  Procedure Laterality Date  . BRAIN SURGERY    . INTRAVASCULAR ULTRASOUND/IVUS N/A 02/16/2020   Procedure: Intravascular Ultrasound/IVUS;  Surgeon: Leonie Man, MD;  Location: [place] [facility];  Service: Cardiovascular;  Laterality: N/A;  . LEFT LOWER LOBECTOMY  05/2001  . LEFT OCCIPITAL CRANIOTOMY  09/21/2005   tumor  . LUNG LOBECTOMY    . NECK SURGERY    . ORIF ANKLE FRACTURE Left 10/02/2014   Procedure: OPEN REDUCTION INTERNAL FIXATION (ORIF) ANKLE FRACTURE;  Surgeon: Alta Corning, MD;  Location: [facility];  Service: Orthopedics;  Laterality: Left;  . ORIF FEMUR FRACTURE Right 06/11/2019   Procedure: OPEN REDUCTION INTERNAL FIXATION (ORIF) DISTAL FEMUR FRACTURE;  Surgeon: Renette Butters, MD;  Location: [facility];  Service: Orthopedics;  Laterality:  Right;  [name] ORIF TIBIA PLATEAU Right 06/11/2019   Procedure: Open Reduction Internal Fixation (Orif) Tibial Plateau;  Surgeon: Renette Butters, MD;  Location: [facility];  Service: Orthopedics;  Laterality: Right;  . RADICAL NECK DISSECTION  10/18/2011   Procedure: RADICAL NECK DISSECTION;  Surgeon: Izora Gala, MD;  Location: [facility];  Service: ENT;  Laterality: Right;  RIGHT MODIFIED NECK DISSECTION /POSSIBLE RIGHT THYROIDECTOM  . RIGHT/LEFT HEART CATH AND CORONARY ANGIOGRAPHY N/A 02/16/2020   Procedure: RIGHT/LEFT HEART CATH AND CORONARY ANGIOGRAPHY;  Surgeon: Leonie Man, MD;  Location: [place] [facility];  Service: Cardiovascular;  Laterality: N/A;  . TEE WITHOUT CARDIOVERSION N/A 03/28/2020   Procedure: TRANSESOPHAGEAL ECHOCARDIOGRAM (TEE);  Surgeon: Buford Dresser, MD;  Location: [facility];  Service:  Cardiovascular;  Laterality: N/A;  . THYROIDECTOMY  10/18/2011   Procedure: THYROIDECTOMY WITH RADICAL NECK DISSECTION;  Surgeon: Izora Gala, MD;  Location: [place];  Service: ENT;  Laterality: Right;    There were no vitals filed for this visit.   Subjective Assessment - 06/01/20 0948    Subjective Patient and husband reports her new Chemo mediciane is making her feel weak and nauseated. She has been in bed for a few days.    Pertinent History History of lung cancer for 18 years.    How long can you sit comfortably? can sit in a wheelchar without difficulty    How long can you stand comfortably? can not stand.    How long can you walk comfortably? can not walk    Diagnostic tests nothing recent    Currently in Pain? No/denies                             [facility] Adult PT Treatment/Exercise - 06/01/20 0001      Therapeutic Activites    Other Therapeutic Activities Sit to stand 3x. Worked with patient on foot placement and weight shfiting but no significant carryover between trials. Patient unable to come to standing poisition despite toltal assist.       Knee/Hip Exercises: Seated   Long Arc Quad Limitations yellow x10 each leg     Heel Slides Limitations hamstirng curl yellow x10 each leg     Other Seated Knee/Hip Exercises seated clamshell x20 yellow ; bilateral er 2x10 yellow; bilateral horizontal abduction 2x10 yellow; tricpes extsion 2x10 bilaterl; scap retraction 2x10 bilateral     Other Seated Knee/Hip Exercises rocking forward in the chair with cuing to bend at the waist. Patient advised to practivce this frequently in preperation for transfer training over the next few weeks.  Seated marching 2x10                   PT Education - 06/01/20 0951    Education Details reviewed UE exercises, attempted to put patient in better posture in her wheelchair    Person(s) Educated Patient    Methods Explanation;Demonstration;Tactile cues;Verbal cues     Comprehension Verbalized understanding;Returned demonstration;Verbal cues required;Tactile cues required            PT Short Term Goals - 06/01/20 0956      PT SHORT TERM GOAL #1   Title Patient will transfer sit to stand with min a    Baseline max a    Time 4    Period Weeks    Status New    Target Date 06/14/20      PT SHORT TERM GOAL #2  Title Patient will stand for 60 seconds without significant fatigue    Baseline 20 seconds    Time 6    Period Weeks    Status On-going    Target Date 06/28/20      PT SHORT TERM GOAL #3   Title Patient will use walker to take a step    Baseline can not take a step    Time 6    Period Weeks    Status On-going    Target Date 06/28/20             PT Long Term Goals - 05/17/20 1655      PT LONG TERM GOAL #1   Title Patient will ambualte 57' with mod A    Time 12    Period Weeks    Status New    Target Date 08/09/20      PT LONG TERM GOAL #2   Title Patient will trnasfer from her wheelchair to a regular chair with min gaurd with walker    Time 12    Period Weeks    Status New    Target Date 08/09/20      PT LONG TERM GOAL #3   Title Patient will stand for 5 minutes in order to perfrom ADL's at the sink    Time 12    Period Weeks    Status New    Target Date 08/09/20                 Plan - 06/01/20 0174    Clinical Impression Statement Patient was very limited today. She was unable to come to a standing position depsite total assist. She was very fatigued after each attmept. She performed all ther-ex. She is having mid thoraci pain. Therapy attmepted to improve her posture in herwheelchair. She sits very slouched with her hisp in a significant ppt. She could correct but continued to slide down. Patient was encouraged to continue fdoing what she can.    Personal Factors and Comorbidities Comorbidity 1;Comorbidity 2;Comorbidity 3+    Comorbidities anxiety, congestive heart failure, active cancer, osteoperosis,  arthritis,    Examination-Activity Limitations Bed Mobility;Bathing;Bend;Carry;Lift;Dressing;Locomotion Level;Squat;Stairs;Stand;Toileting;Transfers    Examination-Participation Restrictions Church;Community Activity;Cleaning;Laundry;Medication Management    Stability/Clinical Decision Making Unstable/Unpredictable    Clinical Decision Making High    Rehab Potential Fair    PT Frequency 2x / week    PT Duration 12 weeks    PT Treatment/Interventions ADLs/Self Care Home Management;Cryotherapy;Gait training;Stair training;Functional mobility training;Therapeutic activities;Therapeutic exercise;Neuromuscular re-education;Patient/family education;Manual techniques    PT Next Visit Plan continue to work on transfer training. May benefit from transfer to h-low table eventually; review UE and postrual exercises. Continue progress standing endrurance.    PT Home Exercise Plan laq, clamshells, hamstirng curls all reviewed with the patients husband as well; weight shifting in the chair for preperation to transfer    Consulted and Agree with Plan of Care Patient           Patient will benefit from skilled therapeutic intervention in order to improve the following deficits and impairments:  Abnormal gait, Decreased activity tolerance, Decreased endurance, Difficulty walking, Decreased mobility, Decreased strength, Pain, Postural dysfunction  Visit Diagnosis: Other abnormalities of gait and mobility  Muscle weakness (generalized)  Abnormal posture     Problem List Patient Active Problem List   Diagnosis Date Noted  . SVT (supraventricular tachycardia) ([place]) 03/26/2020  . Normocytic anemia 03/24/2020  . Urinary tract infection without hematuria   . AMS (  altered mental status) 03/23/2020  . Hypokalemia 03/23/2020  . Prolonged Q-T interval on ECG 03/23/2020  . DNR (do not resuscitate) 03/23/2020  . MRSA bacteremia 03/23/2020  . Acute combined systolic and diastolic heart failure ([place])   .  Elevated troponin   . Dilated cardiomyopathy ([place])   . Coronary artery disease involving native coronary artery of native heart without angina pectoris   . Congestive heart failure (CHF) ([place]) 02/14/2020  . Pyelonephritis 09/10/2019  . Acute blood loss anemia 06/12/2019  . Closed fracture of distal end of right femur, initial encounter ([place]) 06/11/2019  . Closed fracture of lateral portion of right tibial plateau 06/11/2019  . History of DVT of lower extremity 06/11/2019  . Blurry vision 01/06/2019  . Headache 07/24/2016  . Cough 08/24/2015  . Chronic diarrhea 08/24/2015  . Sepsis ([place])   . Encounter for antineoplastic chemotherapy 01/31/2015  . Urinary retention 10/20/2014  . Ileus ([place]) 10/20/2014  . Cellulitis of leg, right 10/20/2014  . Fracture of right proximal fibula   . Ankle fracture 10/02/2014  . Ankle fracture 10/02/2014  . Lung cancer ([place]) 10/02/2014  . Closed fibular fracture 10/02/2014  . Dementia ([place]) 10/02/2014  . Lower urinary tract infectious disease 10/02/2014  . Reactive airway disease 10/02/2014  . Fall 10/02/2014  . Closed left ankle fracture 10/01/2014  . Right fibular fracture 10/01/2014  . Lung metastasis ([place])   . LESION, ANUS 04/26/2009  . DIARRHEA 04/26/2009  . Primary cancer of right upper lobe of lung ([place]) 04/22/2009  . Malignant neoplasm of brain ([place]) 04/22/2009  . VITAMIN D DEFICIENCY 04/22/2009  . ANAL FISTULA 04/22/2009  . OSTEOPOROSIS 04/22/2009    PHYSICAL THERAPY DISCHARGE SUMMARY  Visits from Start of Care: 3  Current functional level related to goals / functional outcomes: Patient admitted to the hospital   Remaining deficits: Significant debility   Education / Equipment: HEP   Plan: Patient agrees to discharge.  Patient goals were not met. Patient is being discharged due to a change in medical status.  ?????      Carney Living PT DPT  06/01/2020, 11:48 AM  [facility] [address] [place], [place], [address] Phone: [phone]   Fax:  [phone]  Name: Valerie [name] MRN: [id number] Date of Birth: May 27, 1946

## 2020-06-02 ENCOUNTER — Telehealth: Payer: Medicare PPO

## 2020-06-03 ENCOUNTER — Ambulatory Visit: Payer: Medicare PPO | Admitting: Physical Therapy

## 2020-06-03 ENCOUNTER — Encounter (HOSPITAL_COMMUNITY): Payer: Self-pay

## 2020-06-03 ENCOUNTER — Other Ambulatory Visit: Payer: Self-pay

## 2020-06-03 ENCOUNTER — Inpatient Hospital Stay (HOSPITAL_COMMUNITY)
Admission: EM | Admit: 2020-06-03 | Discharge: 2020-06-13 | DRG: 872 | Disposition: A | Discharge status: Skilled Nursing Facility | Payer: Medicare PPO | Attending: Family Medicine | Admitting: Family Medicine

## 2020-06-03 ENCOUNTER — Emergency Department (HOSPITAL_COMMUNITY): Payer: Medicare PPO

## 2020-06-03 DIAGNOSIS — E876 Hypokalemia: Secondary | ICD-10-CM | POA: Diagnosis not present

## 2020-06-03 DIAGNOSIS — C7931 Secondary malignant neoplasm of brain: Secondary | ICD-10-CM | POA: Diagnosis present

## 2020-06-03 DIAGNOSIS — R112 Nausea with vomiting, unspecified: Secondary | ICD-10-CM | POA: Diagnosis present

## 2020-06-03 DIAGNOSIS — L0390 Cellulitis, unspecified: Secondary | ICD-10-CM | POA: Diagnosis present

## 2020-06-03 DIAGNOSIS — L03312 Cellulitis of back [any part except buttock]: Secondary | ICD-10-CM

## 2020-06-03 DIAGNOSIS — C7989 Secondary malignant neoplasm of other specified sites: Secondary | ICD-10-CM | POA: Diagnosis present

## 2020-06-03 DIAGNOSIS — Z85850 Personal history of malignant neoplasm of thyroid: Secondary | ICD-10-CM

## 2020-06-03 DIAGNOSIS — R21 Rash and other nonspecific skin eruption: Secondary | ICD-10-CM | POA: Diagnosis not present

## 2020-06-03 DIAGNOSIS — C799 Secondary malignant neoplasm of unspecified site: Secondary | ICD-10-CM

## 2020-06-03 DIAGNOSIS — Z7902 Long term (current) use of antithrombotics/antiplatelets: Secondary | ICD-10-CM

## 2020-06-03 DIAGNOSIS — I480 Paroxysmal atrial fibrillation: Secondary | ICD-10-CM | POA: Diagnosis present

## 2020-06-03 DIAGNOSIS — L270 Generalized skin eruption due to drugs and medicaments taken internally: Secondary | ICD-10-CM | POA: Diagnosis present

## 2020-06-03 DIAGNOSIS — Z923 Personal history of irradiation: Secondary | ICD-10-CM

## 2020-06-03 DIAGNOSIS — L89111 Pressure ulcer of right upper back, stage 1: Secondary | ICD-10-CM | POA: Diagnosis present

## 2020-06-03 DIAGNOSIS — R8271 Bacteriuria: Secondary | ICD-10-CM | POA: Diagnosis not present

## 2020-06-03 DIAGNOSIS — Z902 Acquired absence of lung [part of]: Secondary | ICD-10-CM

## 2020-06-03 DIAGNOSIS — A419 Sepsis, unspecified organism: Principal | ICD-10-CM | POA: Diagnosis present

## 2020-06-03 DIAGNOSIS — E785 Hyperlipidemia, unspecified: Secondary | ICD-10-CM | POA: Diagnosis present

## 2020-06-03 DIAGNOSIS — Z8249 Family history of ischemic heart disease and other diseases of the circulatory system: Secondary | ICD-10-CM

## 2020-06-03 DIAGNOSIS — Z87440 Personal history of urinary (tract) infections: Secondary | ICD-10-CM

## 2020-06-03 DIAGNOSIS — Z79899 Other long term (current) drug therapy: Secondary | ICD-10-CM

## 2020-06-03 DIAGNOSIS — I110 Hypertensive heart disease with heart failure: Secondary | ICD-10-CM | POA: Diagnosis present

## 2020-06-03 DIAGNOSIS — Z20822 Contact with and (suspected) exposure to covid-19: Secondary | ICD-10-CM | POA: Diagnosis not present

## 2020-06-03 DIAGNOSIS — I5042 Chronic combined systolic (congestive) and diastolic (congestive) heart failure: Secondary | ICD-10-CM | POA: Diagnosis not present

## 2020-06-03 DIAGNOSIS — I2510 Atherosclerotic heart disease of native coronary artery without angina pectoris: Secondary | ICD-10-CM | POA: Diagnosis present

## 2020-06-03 DIAGNOSIS — Z7401 Bed confinement status: Secondary | ICD-10-CM

## 2020-06-03 DIAGNOSIS — F0390 Unspecified dementia without behavioral disturbance: Secondary | ICD-10-CM | POA: Diagnosis present

## 2020-06-03 DIAGNOSIS — Z9221 Personal history of antineoplastic chemotherapy: Secondary | ICD-10-CM | POA: Diagnosis not present

## 2020-06-03 DIAGNOSIS — M810 Age-related osteoporosis without current pathological fracture: Secondary | ICD-10-CM | POA: Diagnosis present

## 2020-06-03 DIAGNOSIS — R0902 Hypoxemia: Secondary | ICD-10-CM | POA: Diagnosis present

## 2020-06-03 DIAGNOSIS — Z66 Do not resuscitate: Secondary | ICD-10-CM | POA: Diagnosis not present

## 2020-06-03 DIAGNOSIS — R627 Adult failure to thrive: Secondary | ICD-10-CM | POA: Diagnosis present

## 2020-06-03 DIAGNOSIS — E890 Postprocedural hypothyroidism: Secondary | ICD-10-CM | POA: Diagnosis present

## 2020-06-03 DIAGNOSIS — T451X5A Adverse effect of antineoplastic and immunosuppressive drugs, initial encounter: Secondary | ICD-10-CM | POA: Diagnosis not present

## 2020-06-03 DIAGNOSIS — Z8619 Personal history of other infectious and parasitic diseases: Secondary | ICD-10-CM

## 2020-06-03 DIAGNOSIS — I420 Dilated cardiomyopathy: Secondary | ICD-10-CM | POA: Diagnosis not present

## 2020-06-03 DIAGNOSIS — Z6820 Body mass index (BMI) 20.0-20.9, adult: Secondary | ICD-10-CM | POA: Diagnosis not present

## 2020-06-03 DIAGNOSIS — Z881 Allergy status to other antibiotic agents status: Secondary | ICD-10-CM

## 2020-06-03 DIAGNOSIS — B9620 Unspecified Escherichia coli [E. coli] as the cause of diseases classified elsewhere: Secondary | ICD-10-CM | POA: Diagnosis present

## 2020-06-03 DIAGNOSIS — C3411 Malignant neoplasm of upper lobe, right bronchus or lung: Secondary | ICD-10-CM | POA: Diagnosis present

## 2020-06-03 DIAGNOSIS — Z7901 Long term (current) use of anticoagulants: Secondary | ICD-10-CM | POA: Diagnosis not present

## 2020-06-03 DIAGNOSIS — Z8614 Personal history of Methicillin resistant Staphylococcus aureus infection: Secondary | ICD-10-CM

## 2020-06-03 DIAGNOSIS — L8990 Pressure ulcer of unspecified site, unspecified stage: Secondary | ICD-10-CM | POA: Insufficient documentation

## 2020-06-03 DIAGNOSIS — Y929 Unspecified place or not applicable: Secondary | ICD-10-CM | POA: Diagnosis not present

## 2020-06-03 DIAGNOSIS — E440 Moderate protein-calorie malnutrition: Secondary | ICD-10-CM | POA: Diagnosis not present

## 2020-06-03 DIAGNOSIS — D630 Anemia in neoplastic disease: Secondary | ICD-10-CM | POA: Diagnosis present

## 2020-06-03 DIAGNOSIS — Z86718 Personal history of other venous thrombosis and embolism: Secondary | ICD-10-CM | POA: Diagnosis not present

## 2020-06-03 DIAGNOSIS — R000 Tachycardia, unspecified: Secondary | ICD-10-CM | POA: Diagnosis present

## 2020-06-03 DIAGNOSIS — R609 Edema, unspecified: Secondary | ICD-10-CM | POA: Diagnosis not present

## 2020-06-03 DIAGNOSIS — R7989 Other specified abnormal findings of blood chemistry: Secondary | ICD-10-CM | POA: Diagnosis present

## 2020-06-03 LAB — CBC WITH DIFFERENTIAL/PLATELET
Abs Immature Granulocytes: 0.08 10*3/uL — ABNORMAL HIGH (ref 0.00–0.07)
Basophils Absolute: 0 10*3/uL (ref 0.0–0.1)
Basophils Relative: 0 %
Eosinophils Absolute: 0.2 10*3/uL (ref 0.0–0.5)
Eosinophils Relative: 2 %
HCT: 35.4 % — ABNORMAL LOW (ref 36.0–46.0)
Hemoglobin: 11.2 g/dL — ABNORMAL LOW (ref 12.0–15.0)
Immature Granulocytes: 1 %
Lymphocytes Relative: 7 %
Lymphs Abs: 0.9 10*3/uL (ref 0.7–4.0)
MCH: 30.6 pg (ref 26.0–34.0)
MCHC: 31.6 g/dL (ref 30.0–36.0)
MCV: 96.7 fL (ref 80.0–100.0)
Monocytes Absolute: 0.7 10*3/uL (ref 0.1–1.0)
Monocytes Relative: 5 %
Neutro Abs: 11.7 10*3/uL — ABNORMAL HIGH (ref 1.7–7.7)
Neutrophils Relative %: 85 %
Platelets: 434 10*3/uL — ABNORMAL HIGH (ref 150–400)
RBC: 3.66 MIL/uL — ABNORMAL LOW (ref 3.87–5.11)
RDW: 17.4 % — ABNORMAL HIGH (ref 11.5–15.5)
WBC: 13.7 10*3/uL — ABNORMAL HIGH (ref 4.0–10.5)
nRBC: 0 % (ref 0.0–0.2)

## 2020-06-03 LAB — APTT: aPTT: 46 seconds — ABNORMAL HIGH (ref 24–36)

## 2020-06-03 LAB — COMPREHENSIVE METABOLIC PANEL
ALT: 15 U/L (ref 0–44)
AST: 17 U/L (ref 15–41)
Albumin: 2.5 g/dL — ABNORMAL LOW (ref 3.5–5.0)
Alkaline Phosphatase: 112 U/L (ref 38–126)
Anion gap: 11 (ref 5–15)
BUN: 19 mg/dL (ref 8–23)
CO2: 27 mmol/L (ref 22–32)
Calcium: 9.6 mg/dL (ref 8.9–10.3)
Chloride: 101 mmol/L (ref 98–111)
Creatinine, Ser: 0.93 mg/dL (ref 0.44–1.00)
GFR, Estimated: >60 mL/min (ref >60)
Glucose, Bld: 123 mg/dL — ABNORMAL HIGH (ref 70–99)
Potassium: 2.4 mmol/L — CL (ref 3.5–5.1)
Sodium: 139 mmol/L (ref 135–145)
Total Bilirubin: 1 mg/dL (ref 0.3–1.2)
Total Protein: 5.8 g/dL — ABNORMAL LOW (ref 6.5–8.1)

## 2020-06-03 LAB — URINALYSIS, ROUTINE W REFLEX MICROSCOPIC
Bilirubin Urine: NEGATIVE
Glucose, UA: NEGATIVE mg/dL
Ketones, ur: NEGATIVE mg/dL
Nitrite: NEGATIVE
Protein, ur: 100 mg/dL — AB
RBC / HPF: >50 RBC/hpf — ABNORMAL HIGH (ref 0–5)
Specific Gravity, Urine: 1.019 (ref 1.005–1.030)
WBC, UA: >50 WBC/hpf — ABNORMAL HIGH (ref 0–5)
pH: 5 (ref 5.0–8.0)

## 2020-06-03 LAB — PROTIME-INR
INR: 4.3 (ref 0.8–1.2)
Prothrombin Time: 40.3 seconds — ABNORMAL HIGH (ref 11.4–15.2)

## 2020-06-03 LAB — RESPIRATORY PANEL BY RT PCR (FLU A&B, COVID)
Influenza A by PCR: NEGATIVE
Influenza B by PCR: NEGATIVE
SARS Coronavirus 2 by RT PCR: NEGATIVE

## 2020-06-03 LAB — LACTIC ACID, PLASMA
Lactic Acid, Venous: 2.2 mmol/L (ref 0.5–1.9)
Lactic Acid, Venous: 2.2 mmol/L (ref 0.5–1.9)
Lactic Acid, Venous: 2.2 mmol/L (ref 0.5–1.9)

## 2020-06-03 LAB — LIPASE, BLOOD: Lipase: 29 U/L (ref 11–51)

## 2020-06-03 MED ORDER — SODIUM CHLORIDE 0.9 % IV SOLN
1.0000 g | Freq: Two times a day (BID) | INTRAVENOUS | Status: DC
  Administered 2020-06-04 – 2020-06-05 (×4): 1 g via INTRAVENOUS
  Filled 2020-06-03 (×4): qty 1

## 2020-06-03 MED ORDER — PROCHLORPERAZINE MALEATE 10 MG PO TABS: 10.0000 mg | ORAL_TABLET | Freq: Four times a day (QID) | ORAL | Status: DC | PRN

## 2020-06-03 MED ORDER — CLOPIDOGREL BISULFATE 75 MG PO TABS
75.0000 mg | ORAL_TABLET | Freq: Every day | ORAL | Status: DC
  Administered 2020-06-04 – 2020-06-13 (×10): 75 mg via ORAL
  Filled 2020-06-03 (×10): qty 1

## 2020-06-03 MED ORDER — IOHEXOL 350 MG/ML SOLN
100.0000 mL | Freq: Once | INTRAVENOUS | Status: AC | PRN
  Administered 2020-06-03: 100 mL via INTRAVENOUS

## 2020-06-03 MED ORDER — SODIUM CHLORIDE 0.9 % IV SOLN
1.0000 g | Freq: Once | INTRAVENOUS | Status: AC
  Administered 2020-06-03: 1 g via INTRAVENOUS
  Filled 2020-06-03: qty 1

## 2020-06-03 MED ORDER — ADULT MULTIVITAMIN W/MINERALS CH
1.0000 | ORAL_TABLET | Freq: Every evening | ORAL | Status: DC
  Administered 2020-06-03 – 2020-06-12 (×10): 1 via ORAL
  Filled 2020-06-03 (×10): qty 1

## 2020-06-03 MED ORDER — VITAMIN C 250 MG PO TABS
250.0000 mg | ORAL_TABLET | Freq: Every evening | ORAL | Status: DC
  Administered 2020-06-03 – 2020-06-12 (×10): 250 mg via ORAL
  Filled 2020-06-03 (×12): qty 1

## 2020-06-03 MED ORDER — VANCOMYCIN HCL 500 MG/100ML IV SOLN
500.0000 mg | Freq: Two times a day (BID) | INTRAVENOUS | Status: DC
  Administered 2020-06-03 – 2020-06-06 (×7): 500 mg via INTRAVENOUS
  Filled 2020-06-03 (×8): qty 100

## 2020-06-03 MED ORDER — ATORVASTATIN CALCIUM 40 MG PO TABS
80.0000 mg | ORAL_TABLET | Freq: Every day | ORAL | Status: DC
  Administered 2020-06-03 – 2020-06-07 (×5): 80 mg via ORAL
  Filled 2020-06-03 (×5): qty 2

## 2020-06-03 MED ORDER — SODIUM CHLORIDE 0.9 % IV BOLUS (SEPSIS): 1000.0000 mL | Freq: Once | INTRAVENOUS | Status: DC

## 2020-06-03 MED ORDER — VANCOMYCIN HCL IN DEXTROSE 1-5 GM/200ML-% IV SOLN: 1000.0000 mg | Freq: Once | INTRAVENOUS | Status: DC

## 2020-06-03 MED ORDER — POTASSIUM CHLORIDE CRYS ER 20 MEQ PO TBCR
40.0000 meq | EXTENDED_RELEASE_TABLET | ORAL | Status: AC
  Administered 2020-06-03 (×2): 40 meq via ORAL
  Filled 2020-06-03 (×2): qty 2

## 2020-06-03 MED ORDER — MAGNESIUM OXIDE 400 (241.3 MG) MG PO TABS
400.0000 mg | ORAL_TABLET | Freq: Every day | ORAL | Status: DC
  Administered 2020-06-03 – 2020-06-13 (×11): 400 mg via ORAL
  Filled 2020-06-03 (×11): qty 1

## 2020-06-03 MED ORDER — METOPROLOL SUCCINATE ER 25 MG PO TB24
25.0000 mg | ORAL_TABLET | Freq: Every day | ORAL | Status: DC
  Administered 2020-06-04 – 2020-06-13 (×10): 25 mg via ORAL
  Filled 2020-06-03 (×10): qty 1

## 2020-06-03 MED ORDER — POTASSIUM CHLORIDE 10 MEQ/100ML IV SOLN
10.0000 meq | INTRAVENOUS | Status: AC
  Administered 2020-06-03 (×2): 10 meq via INTRAVENOUS
  Filled 2020-06-03 (×2): qty 100

## 2020-06-03 MED ORDER — VITAMIN D3 25 MCG (1000 UNIT) PO TABS
50.0000 ug | ORAL_TABLET | Freq: Every day | ORAL | Status: DC
  Administered 2020-06-04 – 2020-06-13 (×10): 2000 [IU] via ORAL
  Filled 2020-06-03 (×10): qty 2

## 2020-06-03 MED ORDER — SODIUM CHLORIDE (PF) 0.9 % IJ SOLN
INTRAMUSCULAR | Status: AC
  Filled 2020-06-03: qty 50

## 2020-06-03 MED ORDER — SODIUM CHLORIDE 0.9 % IV BOLUS
250.0000 mL | Freq: Once | INTRAVENOUS | Status: AC
  Administered 2020-06-03: 250 mL via INTRAVENOUS

## 2020-06-03 MED ORDER — ERLOTINIB HCL 150 MG PO TABS: 150.0000 mg | ORAL_TABLET | Freq: Every day | ORAL | Status: DC

## 2020-06-03 MED ORDER — DONEPEZIL HCL 10 MG PO TABS
10.0000 mg | ORAL_TABLET | Freq: Every day | ORAL | Status: DC
  Administered 2020-06-03 – 2020-06-13 (×11): 10 mg via ORAL
  Filled 2020-06-03 (×11): qty 1

## 2020-06-03 MED ORDER — SODIUM CHLORIDE 0.9 % IV SOLN: 1.0000 g | Freq: Once | INTRAVENOUS | Status: DC

## 2020-06-03 MED ORDER — ACETAMINOPHEN 500 MG PO TABS
1000.0000 mg | ORAL_TABLET | Freq: Four times a day (QID) | ORAL | Status: DC | PRN
  Administered 2020-06-04 – 2020-06-08 (×2): 1000 mg via ORAL
  Filled 2020-06-03 (×2): qty 2

## 2020-06-03 MED ORDER — SACCHAROMYCES BOULARDII 250 MG PO CAPS
250.0000 mg | ORAL_CAPSULE | Freq: Two times a day (BID) | ORAL | Status: DC
  Administered 2020-06-03 – 2020-06-13 (×21): 250 mg via ORAL
  Filled 2020-06-03 (×21): qty 1

## 2020-06-03 MED ORDER — VANCOMYCIN HCL IN DEXTROSE 1-5 GM/200ML-% IV SOLN
1000.0000 mg | Freq: Once | INTRAVENOUS | Status: AC
  Administered 2020-06-03: 1000 mg via INTRAVENOUS
  Filled 2020-06-03: qty 200

## 2020-06-03 MED ORDER — RIVAROXABAN 20 MG PO TABS
20.0000 mg | ORAL_TABLET | Freq: Every evening | ORAL | Status: DC
  Administered 2020-06-03 – 2020-06-13 (×11): 20 mg via ORAL
  Filled 2020-06-03 (×11): qty 1

## 2020-06-03 MED ORDER — VITAMIN B-12 1000 MCG PO TABS
1000.0000 ug | ORAL_TABLET | Freq: Every evening | ORAL | Status: DC
  Administered 2020-06-03 – 2020-06-12 (×10): 1000 ug via ORAL
  Filled 2020-06-03 (×10): qty 1

## 2020-06-03 MED ORDER — SODIUM CHLORIDE 0.9 % IV BOLUS
500.0000 mL | Freq: Once | INTRAVENOUS | Status: AC
  Administered 2020-06-03: 500 mL via INTRAVENOUS

## 2020-06-03 NOTE — Progress Notes (Signed)
A consult was received from an ED physician for Meropenem, Vancomycin per pharmacy dosing.  The patient's profile has been reviewed for ht/wt/allergies/indication/available labs.   Most recent weight documented 69.8 kg on 03/23/2020 A one time order has been placed for Meropenem 1g IV, Vanc 1g IV.  Further antibiotics/pharmacy consults should be ordered by admitting physician if indicated.                       Thank you,  Gretta Arab PharmD, BCPS Clinical Pharmacist [facility] main pharmacy [phone] 06/03/2020 12:09 PM

## 2020-06-03 NOTE — ED Notes (Signed)
Report called to [place]

## 2020-06-03 NOTE — Progress Notes (Signed)
Pharmacy Note    Erlotinib (Tarceva) hold criteria  SCr > 1.5x baseline (or > 2 if baseline unknown)  AST or ALT > 3x ULN  Bili > 1.5x ULN  Acute coronary syndrome  Acute CVA  Bullous or exfoliative skin eruption  Gastrointestinal perforation  Unexplained pneumonitis / hypoxemia  Active infection     Due to cellulitis being treated with broad spectrum abx, will hold med as per P&T criteria.   Royetta Asal, PharmD, BCPS 06/03/2020 4:59 PM

## 2020-06-03 NOTE — ED Triage Notes (Addendum)
Left sided abd pain x 2 days. N/V. Multiple wounds all over body

## 2020-06-03 NOTE — ED Provider Notes (Signed)
Medical screening examination/treatment/procedure(s) were conducted as a shared visit with non-physician practitioner(s) and myself.  I personally evaluated the patient during the encounter.    74 year old female here planing of left-sided abdominal discomfort.  On exam she does have some discoloration to her entire left lateral chest as well as left flank area.  Abdomen is mildly tender.  Labs and x-rays pending at this time.   Lacretia Leigh, MD 06/03/20 1050

## 2020-06-03 NOTE — Progress Notes (Signed)
Pharmacy Antibiotic Note  Valerie [name] is a 74 y.o. female admitted on 06/03/2020 with cellulitis.  Pharmacy has been consulted for vancomycin and cellulitis  dosing.  Plan: Vancomycin 1000 mg IV x1, then vancomycin 500 mg IV q12h  Meropenem 1000 mg IV q12h  Monitor clinical course, renal function, cultures as available   Weight: 59 kg (130 lb)  Temp (24hrs), Avg:98.1 F (36.7 C), Min:97.6 F (36.4 C), Max:98.8 F (37.1 C)  Recent Labs  Lab 06/03/20 1022 06/03/20 1023 06/03/20 1220  WBC 13.7*  --   --   CREATININE 0.93  --   --   LATICACIDVEN  --  2.2* 2.2*    Estimated Creatinine Clearance: 49.4 mL/min (by C-G formula based on SCr of 0.93 mg/dL).    Allergies  Allergen Reactions  . Keflex [Cephalexin] Itching and Rash    Throat swelling- Patient has tolerated Rocephin & Cefdinir since this reaction    Antimicrobials this admission: 10/15 meropenem >>  10/15 vancomycin >>   Dose adjustments this admission:   Microbiology results: 10/15 BCx:  10/15 UCx:  10/15 Resp panel:  Negative     Thank you for allowing pharmacy to be a part of this patient's care.   Royetta Asal, PharmD, BCPS 06/03/2020 3:42 PM

## 2020-06-03 NOTE — Progress Notes (Signed)
Notified provider of need to order repeat lactic acid (3rd). Order received immediatly.

## 2020-06-03 NOTE — Progress Notes (Signed)
Notified provider of need to order repeat lactic acid (4th), d/t first 3 all being 2.2.  Order immediatly received.  Greatly appreciated.

## 2020-06-03 NOTE — H&P (Signed)
History and Physical    Valerie [name] VPX:[id number] DOB: 1946/07/30 DOA: 06/03/2020  I have briefly reviewed the patient's prior medical records in [place]  PCP: [name], MD  Patient coming from: home  Chief Complaint: back and left flank pain  HPI: Valerie [name] is a 74 y.o. female with medical history significant of metastatic non-small cell lung cancer, adenocarcinoma with brain mets, initially diagnosed as stage IIb in 2002, followed by Dr. Earlie Server, with complex history including craniotomy for tumor resection, whole brain radiation, gamma knife, right thyroidectomy with radical neck dissection, currently on Tarceva started July 2021, history of systolic CHF, recent admission for MRSA bacteremia, recurrent UTIs including ESBL E. coli comes to the hospital with complaints of left-sided flank pain and pain in her back.  Patient tells me that she has been extremely weak in the last several weeks, barely ambulating, she has had very poor p.o. intake and has been complaining of nausea, vomiting, diarrhea for the past few weeks.  She has been laying on her back but recently she started having increasing pain in her back area.  She denies any cough, denies any shortness of breath, no chest pain, no palpitations.  She complains of intermittent abdominal pain.  She denies any fevers home but does complain of chills.  ED Course: In the emergency room she is afebrile 98.8, tachycardic with heart rate of 1 teens, who was found to be hypoxic requiring 2 L, lactic acid slightly elevated at 2.2 and blood work is otherwise pertinent for an white count of 13.7 and a potassium of 2.4.  Urinalysis has evidence of a UTI.  She was given IV fluids, vancomycin and meropenem and we were called to admit.  Review of Systems: All systems reviewed, and apart from HPI, all negative  Past Medical History:  Diagnosis Date  . Abnormal Pap smear 2006  . Anal fistula   . Ankle fracture   .  Anxiety   . Arthritis   . Atrophic vaginitis 2008  . Cataract   . Dementia ([place]) 2009  . Dyspareunia 2008  . H/O osteoporosis   . H/O varicella   . H/O vitamin D deficiency   . Headache 07/24/2016  . Heart murmur   . History of measles, mumps, or rubella   . History of radiation therapy 07/28/13- 08/10/13   right lung metastasis 5000 cGy 10 sessions  . Hypertension   . Lung cancer ([place]) dx'd 2002  . Lung cancer ([place])   . Lung cancer ([place])   . Lung metastasis ([place])    PET scan 05/05/13, RUL lung nodule  . Metastasis to brain [facility]) dx'd 2008  . 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 Nodule of right lung CT- 06/03/12   RIGHT UPPER LOBE  . Nodule of right lung 06/03/12   Upper Lobe  . On antineoplastic chemotherapy    TARCEVA  . Osteoporosis 2010  . Primary cancer of right upper lobe of lung ([place]) 04/22/2009   Qualifier: Diagnosis of  By: Nils Pyle CMA ([place]), Mearl Latin    . Shortness of breath    hx lung ca   . Status post chemotherapy 2003   CARBOPLATIN/PACLITAXEL /STATUS POST CLINICAL TRAIL OF CELEBREX AND IRESSA AT [facility] FOR 1 YEAR  . Status post radiation therapy 2003   LEFT LUNG  . Status post radiation therapy 11/07/2005   WHOLE BRAIN: DR Larkin [facility]  . Status post radiation therapy 06/02/2008   GAMMA KNIFE OF RESECTED  CAVITAY  . Thyroid adenoma    ?  [name] Thyroid cancer ([place]) 10/18/11 bx   adenoid nodules   . Yeast infection     Past Surgical History:  Procedure Laterality Date  . BRAIN SURGERY    . INTRAVASCULAR ULTRASOUND/IVUS N/A 02/16/2020   Procedure: Intravascular Ultrasound/IVUS;  Surgeon: Leonie Man, MD;  Location: [facility];  Service: Cardiovascular;  Laterality: N/A;  . LEFT LOWER LOBECTOMY  05/2001  . LEFT OCCIPITAL CRANIOTOMY  09/21/2005   tumor  . LUNG LOBECTOMY    . NECK SURGERY    . ORIF ANKLE FRACTURE Left 10/02/2014   Procedure: OPEN REDUCTION INTERNAL FIXATION (ORIF) ANKLE FRACTURE;  Surgeon: Alta Corning, MD;  Location: [facility];   Service: Orthopedics;  Laterality: Left;  . ORIF FEMUR FRACTURE Right 06/11/2019   Procedure: OPEN REDUCTION INTERNAL FIXATION (ORIF) DISTAL FEMUR FRACTURE;  Surgeon: Renette Butters, MD;  Location: [place];  Service: Orthopedics;  Laterality: Right;  . ORIF TIBIA PLATEAU Right 06/11/2019   Procedure: Open Reduction Internal Fixation (Orif) Tibial Plateau;  Surgeon: Renette Butters, MD;  Location: [place];  Service: Orthopedics;  Laterality: Right;  . RADICAL NECK DISSECTION  10/18/2011   Procedure: RADICAL NECK DISSECTION;  Surgeon: Izora Gala, MD;  Location: [place];  Service: ENT;  Laterality: Right;  RIGHT MODIFIED NECK DISSECTION /POSSIBLE RIGHT THYROIDECTOM  . RIGHT/LEFT HEART CATH AND CORONARY ANGIOGRAPHY N/A 02/16/2020   Procedure: RIGHT/LEFT HEART CATH AND CORONARY ANGIOGRAPHY;  Surgeon: Leonie Man, MD;  Location: [place] [facility];  Service: Cardiovascular;  Laterality: N/A;  . TEE WITHOUT CARDIOVERSION N/A 03/28/2020   Procedure: TRANSESOPHAGEAL ECHOCARDIOGRAM (TEE);  Surgeon: Buford Dresser, MD;  [facility];  Service: Cardiovascular;  Laterality: N/A;  . THYROIDECTOMY  10/18/2011   Procedure: THYROIDECTOMY WITH RADICAL NECK DISSECTION;  Surgeon: Izora Gala, MD;  Location: [place];  Service: ENT;  Laterality: Right;     reports that she has never smoked. She has never used smokeless tobacco. She reports that she does not drink alcohol and does not use drugs.  Allergies  Allergen Reactions  . Keflex [Cephalexin] Itching and Rash    Throat swelling- Patient has tolerated Rocephin & Cefdinir since this reaction    Family History  Problem Relation Age of Onset  . Gait disorder Mother   . Cancer Mother        Meningioma  . Cancer Father        Pancreatic  . Heart failure Sister   . Cancer Maternal Aunt        menigeoma    Prior to Admission medications   Medication Sig Start Date End Date Taking? Authorizing Provider  acetaminophen (TYLENOL) 500 MG  tablet Take 1,000 mg by mouth every 6 (six) hours as needed for mild pain, fever or headache.   Yes [provider]  Ascorbic Acid (VITAMIN C) 100 MG tablet Take 100 mg by mouth every evening. Gummie   Yes [provider]  atorvastatin (LIPITOR) 80 MG tablet Take 1 tablet (80 mg total) by mouth at bedtime. 02/18/20  Yes Nita Sells, MD  Cholecalciferol (D3 PO) Take 1 capsule by mouth daily.   Yes [provider]  clopidogrel (PLAVIX) 75 MG tablet Take 1 tablet (75 mg total) by mouth daily with breakfast. 05/24/20  Yes Buford Dresser, MD  diphenhydrAMINE (BENADRYL) 25 MG tablet Take 25 mg by mouth every 6 (six) hours as needed for allergies.   Yes [provider]  donepezil (ARICEPT) 10 MG tablet Take 10 mg by mouth at bedtime.   Yes [provider]  erlotinib (TARCEVA) 150 MG tablet TAKE 1 TABLET (150 MG TOTAL) BY MOUTH DAILY. TAKE ON AN EMPTY STOMACH 1 HOUR BEFORE MEALS OR 2 HOURS AFTER. 04/15/20  Yes Curt Bears, MD  loperamide (IMODIUM A-D) 2 MG tablet Take 4 mg by mouth in the morning and at bedtime.   Yes [provider]  MAGNESIUM PO Take 1 tablet by mouth daily.   Yes [provider]  metoprolol succinate (TOPROL-XL) 25 MG 24 hr tablet Take 1 tablet (25 mg total) by mouth daily. 05/24/20  Yes Buford Dresser, MD  Multiple Vitamin (MULTI-VITAMINS) TABS Take 1 tablet by mouth every evening.    Yes [provider]  potassium chloride SA (KLOR-CON) 20 MEQ tablet Take 20 mEq by mouth daily.   Yes [provider]  prochlorperazine (COMPAZINE) 10 MG tablet Take 1 tablet (10 mg total) by mouth every 6 (six) hours as needed for nausea or vomiting. 05/30/20  Yes Curt Bears, MD  vitamin B-12 (CYANOCOBALAMIN) 1000 MCG tablet Take 1,000 mcg by mouth every evening.   Yes [provider]  XARELTO 20 MG TABS tablet Take 1 tablet (20 mg total) by mouth every evening. 12/15/19  Yes [name], MD  zinc oxide (BALMEX) 11.3 % CREA cream Apply 1 application topically as needed (lesions/buttocks).   Yes [provider]  saccharomyces boulardii (FLORASTOR) 250 MG capsule Take 1 capsule (250 mg total) by mouth 2 (two) times daily. Patient not taking: Reported on 06/03/2020 03/03/20   [name], MD    Physical Exam: Vitals:   06/03/20 1019 06/03/20 1156 06/03/20 1215 06/03/20 1230  BP:  110/64 109/69 (!) 116/57  Pulse:  (!) 107 (!) 101 83  Resp:  20 15 15   Temp: 98.8 F (37.1 C)     TempSrc: Rectal     SpO2:  100% 100% 100%    Constitutional: No distress, frail-appearing woman Eyes: No scleral icterus ENMT: Mucous membranes are moist. Neck: normal, supple Respiratory: clear to auscultation bilaterally, no wheezing, no crackles. Normal respiratory effort.  Cardiovascular: Regular rate and rhythm, no murmurs / rubs / gallops. No extremity edema.  Abdomen: no tenderness, no masses palpated. Bowel sounds positive.  Musculoskeletal: no clubbing / cyanosis. Normal muscle tone.  Skin: Large cellulitic rash spanning from the left buttock comprising her entire back, stage I pressure ulcer at the right shoulder blade Neurologic: CN 2-12 grossly intact. Strength 5/5 in all 4.  Psychiatric: Normal judgment and insight. Alert and oriented x 3. Normal mood.       Labs on Admission: I have personally reviewed following labs and imaging studies  CBC: Recent Labs  Lab 06/03/20 1022  WBC 13.7*  NEUTROABS 11.7*  HGB 11.2*  HCT 35.4*  MCV 96.7  PLT 170*   Basic Metabolic Panel: Recent Labs  Lab 06/03/20 1022  NA 139  K 2.4*  CL 101  CO2 27  GLUCOSE 123*  BUN 19  CREATININE 0.93  CALCIUM 9.6   Liver Function Tests: Recent Labs  Lab 06/03/20 1022  AST 17  ALT 15  ALKPHOS 112  BILITOT 1.0  PROT 5.8*  ALBUMIN 2.5*   Coagulation Profile: Recent Labs  Lab 06/03/20 1022  INR 4.3*   BNP (last 3 results) No results for input(s): PROBNP in the  last 8760 hours. CBG: No results for input(s): GLUCAP in the last 168 hours.  Thyroid Function Tests: No results for input(s): TSH, T4TOTAL, FREET4, T3FREE, THYROIDAB in the last 72 hours. Urine analysis:    Component Value Date/Time   COLORURINE YELLOW 06/03/2020 1023   APPEARANCEUR TURBID (A) 06/03/2020 1023   LABSPEC 1.019 06/03/2020 1023   PHURINE 5.0 06/03/2020 1023   GLUCOSEU NEGATIVE 06/03/2020 1023   HGBUR LARGE (A) 06/03/2020 1023   BILIRUBINUR NEGATIVE 06/03/2020 1023   BILIRUBINUR negative 05/25/2019 [place] 06/03/2020 1023   PROTEINUR 100 (A) 06/03/2020 1023   UROBILINOGEN 0.2 05/25/2019 1653   UROBILINOGEN 0.2 10/20/2014 1636   NITRITE NEGATIVE 06/03/2020 1023   LEUKOCYTESUR LARGE (A) 06/03/2020 1023     Radiological Exams on Admission: CT Angio Chest PE W/Cm &/Or Wo Cm  Result Date: 06/03/2020 CLINICAL DATA:  High probability of pulmonary embolus. Left-sided abdominal pain. EXAM: CT ANGIOGRAPHY CHEST CT ABDOMEN AND PELVIS WITH CONTRAST TECHNIQUE: Multidetector CT imaging of the chest was performed using the standard protocol during bolus administration of intravenous contrast. Multiplanar CT image reconstructions and MIPs were obtained to evaluate the vascular anatomy. Multidetector CT imaging of the abdomen and pelvis was performed using the standard protocol during bolus administration of intravenous contrast. CONTRAST:  118mL OMNIPAQUE IOHEXOL 350 MG/ML SOLN COMPARISON:  April 26, 2020. FINDINGS: CTA CHEST FINDINGS Cardiovascular: Satisfactory opacification of the pulmonary arteries to the segmental level. No evidence of pulmonary embolism. Mild cardiomegaly is noted. No pericardial effusion. Atherosclerosis of thoracic aorta is noted without aneurysm formation. Mediastinum/Nodes: No enlarged mediastinal, hilar, or axillary lymph nodes. Thyroid gland, trachea, and esophagus demonstrate no significant findings. Lungs/Pleura: No pneumothorax is noted.  Status post left lower lobectomy. Scarring and other postsurgical changes are noted posteriorly. Small left pleural effusion is noted. Musculoskeletal: Postsurgical changes are seen involving the left ribs. Moderate thoracic kyphosis is noted. No acute osseous abnormality is noted. Review of the MIP images confirms the above findings. CT ABDOMEN and PELVIS FINDINGS Hepatobiliary: No focal liver abnormality is seen. No gallstones, gallbladder wall thickening, or biliary dilatation. Pancreas: Unremarkable. No pancreatic ductal dilatation or surrounding inflammatory changes. Spleen: Normal in size without focal abnormality. Adrenals/Urinary Tract: Adrenal glands are unremarkable. Kidneys are normal, without renal calculi, focal lesion, or hydronephrosis. Bladder is unremarkable. Stomach/Bowel: Stomach is within normal limits. Appendix appears normal. No evidence of bowel wall thickening, distention, or inflammatory changes. Vascular/Lymphatic: Aortic atherosclerosis. No enlarged abdominal or pelvic lymph nodes. Reproductive: At least 2 uterine fibroids are noted. No adnexal abnormality is noted. Other: No abdominal wall hernia or abnormality. No abdominopelvic ascites. Musculoskeletal: No acute or significant osseous findings. Review of the MIP images confirms the above findings. IMPRESSION: 1. No definite evidence of pulmonary embolus. 2. Status post left lower lobectomy with postsurgical changes seen posteriorly. 3. Small left pleural effusion is noted. 4. Aortic atherosclerosis. 5. At least 2 uterine fibroids are noted. 6. No acute abnormality seen in the abdomen or pelvis. Aortic Atherosclerosis (ICD10-I70.0). Electronically Signed   By: Marijo Conception M.D.   On: 06/03/2020 13:43   CT Abdomen Pelvis W Contrast  Result Date: 06/03/2020 CLINICAL DATA:  High probability of pulmonary embolus. Left-sided abdominal pain. EXAM: CT ANGIOGRAPHY CHEST CT ABDOMEN AND PELVIS WITH CONTRAST TECHNIQUE: Multidetector CT  imaging of the chest was performed using the standard protocol during bolus administration of intravenous contrast. Multiplanar CT image reconstructions and MIPs were obtained to evaluate the vascular anatomy. Multidetector CT imaging of the abdomen and pelvis was performed using the standard protocol during bolus administration of intravenous  contrast. CONTRAST:  158mL OMNIPAQUE IOHEXOL 350 MG/ML SOLN COMPARISON:  April 26, 2020. FINDINGS: CTA CHEST FINDINGS Cardiovascular: Satisfactory opacification of the pulmonary arteries to the segmental level. No evidence of pulmonary embolism. Mild cardiomegaly is noted. No pericardial effusion. Atherosclerosis of thoracic aorta is noted without aneurysm formation. Mediastinum/Nodes: No enlarged mediastinal, hilar, or axillary lymph nodes. Thyroid gland, trachea, and esophagus demonstrate no significant findings. Lungs/Pleura: No pneumothorax is noted. Status post left lower lobectomy. Scarring and other postsurgical changes are noted posteriorly. Small left pleural effusion is noted. Musculoskeletal: Postsurgical changes are seen involving the left ribs. Moderate thoracic kyphosis is noted. No acute osseous abnormality is noted. Review of the MIP images confirms the above findings. CT ABDOMEN and PELVIS FINDINGS Hepatobiliary: No focal liver abnormality is seen. No gallstones, gallbladder wall thickening, or biliary dilatation. Pancreas: Unremarkable. No pancreatic ductal dilatation or surrounding inflammatory changes. Spleen: Normal in size without focal abnormality. Adrenals/Urinary Tract: Adrenal glands are unremarkable. Kidneys are normal, without renal calculi, focal lesion, or hydronephrosis. Bladder is unremarkable. Stomach/Bowel: Stomach is within normal limits. Appendix appears normal. No evidence of bowel wall thickening, distention, or inflammatory changes. Vascular/Lymphatic: Aortic atherosclerosis. No enlarged abdominal or pelvic lymph nodes. Reproductive:  At least 2 uterine fibroids are noted. No adnexal abnormality is noted. Other: No abdominal wall hernia or abnormality. No abdominopelvic ascites. Musculoskeletal: No acute or significant osseous findings. Review of the MIP images confirms the above findings. IMPRESSION: 1. No definite evidence of pulmonary embolus. 2. Status post left lower lobectomy with postsurgical changes seen posteriorly. 3. Small left pleural effusion is noted. 4. Aortic atherosclerosis. 5. At least 2 uterine fibroids are noted. 6. No acute abnormality seen in the abdomen or pelvis. Aortic Atherosclerosis (ICD10-I70.0). Electronically Signed   By: Marijo Conception M.D.   On: 06/03/2020 13:43   DG Chest Port 1 View  Result Date: 06/03/2020 CLINICAL DATA:  Sepsis. EXAM: PORTABLE CHEST 1 VIEW COMPARISON:  March 22, 2020.  April 26, 2020. FINDINGS: Stable cardiomegaly. No pneumothorax is noted. Right lung is clear. Mild left pleural effusion is noted with associated left basilar atelectasis or infiltrate. Postsurgical changes are noted in the left hilar region. Old left rib fractures are noted. IMPRESSION: Mild left pleural effusion is noted with associated left basilar atelectasis or infiltrate. No pneumothorax is noted. Electronically Signed   By: Marijo Conception M.D.   On: 06/03/2020 11:26    EKG: Independently reviewed.  Poor tracing, appears sinus  Assessment/Plan  Principal Problem Sepsis due to cellulitis on her back, possible UTI -Patient with large area of cellulitis spanning from her buttocks all the way to her shoulder blades, please see pictures above -Given recent MRSA bacteremia and multidrug-resistant infections in the past will cover broadly with vancomycin and meropenem -Cultures obtained, patient received IV fluids in the ED -We will avoid further IV fluids now as her blood pressure is normal, given EF of 25%, monitor clinical response and blood pressure -Consult wound  Active Problems Hypokalemia -Has a  history of hypokalemia, replete aggressively IV n.p.o., recheck in the morning  History of DVT -Continue home Xarelto  Severe deconditioning, failure to thrive -Consult PT, patient has been nonambulatory for several weeks, she has lost weight and has poor p.o. intake.  Chronic combined CHF, dilated cardiomyopathy -Most recent EF 25-30% with global hypokinesis, grade 1 diastolic dysfunction -Continu statin, Plavix, metoprolol -Recent cath done in June showed minimal CAD, 50% circumflex disease, it was felt that her cardiomyopathy was likely secondary to  Tagrisso which has been discontinued at that time.  History of non-small cell lung cancer with mets to the brain and neck -During prior hospital stay there was a question whether her Tarceva needs to be continued during MRSA bacteremia, after discussion with the cancer center she was continued.  Resume here  Recent MRSA bacteremia -Per prior notes, patient did not want a PICC line and received IV vancomycin while hospitalized for a week and was discharged on linezolid.  Keep on vancomycin and monitor cultures for at least 48 hours.  History of dementia -Mild, alert and oriented x4, continue home medications  Paroxysmal A. Fib -Continue Xarelto, metoprolol  Hyperlipidemia -Continue statin  Normocytic anemia -No bleeding, likely in the setting of chronic illness   DVT prophylaxis: on Xarelto Code Status: DNR per patient Family Communication: No family at bedside Disposition Plan: To be determined Bed Type: Telemetry Consults called: Add Dr. Earlie Server to the consult list Obs/Inp: Inpatient  At the time of admission, it appears that the appropriate admission status for this patient is INPATIENT as it is expected that patient will require hospital care > 2 midnights. This is judged to be reasonable and necessary in order to provide the required intensity of service to ensure the patient's safety given: presenting symptoms, initial  radiographic and laboratory data and in the context of their chronic comorbidities. Together, these circumstances are felt to place patient at high at high risk for further clinical deterioration threatening life, limb, or organ.  Marzetta Board, MD, PhD [facility]  Contact via [email or web address]  06/03/2020, 2:28 PM

## 2020-06-03 NOTE — ED Provider Notes (Signed)
[facility] DEPT Provider Note   CSN: [id number] Arrival date & time: 06/03/20  0940     History Chief Complaint  Patient presents with  . Weakness  . Wound Check    Valerie [name] is a 74 y.o. 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 fib that presents emergency department today for possible sepsis via EMS.  Called EMS due to left-sided pain, patient is alert and oriented.  When EMS found her she was satting at 80% on room air, they put her on nonrebreather, transitioned her to 2 L.  Satting at 93% on 2 L, was also found to be tachycardic at 130, afebrile, normotensive.  Patient states that she has been having nausea and vomiting for the past month, patient is currently on oral chemotherapy.  States that she is also been having diarrhea that started recently.  No blood or melena.  Patient states that vomit is more spit, states that she does not eat much.  Has been compliant with anticoagulation.  States that she has been bedridden for the past month, has multiple wounds on the left side where she is complaining of pain.  Also complaining of abdominal pain on the left side.  States that she has been urinating normally, no dysuria hematuria.  Lives with her husband at home.  Has been vaccinated against Covid, no cough, sore throat, congestion.  No one sick at home.  No fevers or chills, no weakness numbness or tingling.  Patient does not use oxygen at home.  HPI     Past Medical History:  Diagnosis Date  . Abnormal Pap smear 2006  . Anal fistula   . Ankle fracture   . Anxiety   . Arthritis   . Atrophic vaginitis 2008  . Cataract   . Dementia ([place]) 2009  . Dyspareunia 2008  . H/O osteoporosis   . H/O varicella   . H/O vitamin D deficiency   . Headache 07/24/2016  . Heart murmur   .  History of measles, mumps, or rubella   . History of radiation therapy 07/28/13- 08/10/13   right lung metastasis 5000 cGy 10 sessions  . Hypertension   . Lung cancer ([place]) dx'd 2002  . Lung cancer ([place])   . Lung cancer ([place])   . Lung metastasis ([place])    PET scan 05/05/13, RUL lung nodule  . Metastasis to brain [facility]) dx'd 2008  . 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 Nodule of right lung CT- 06/03/12   RIGHT UPPER LOBE  . Nodule of right lung 06/03/12   Upper Lobe  . On antineoplastic chemotherapy    TARCEVA  . Osteoporosis 2010  . Primary cancer of right upper lobe of lung ([place]) 04/22/2009   Qualifier: Diagnosis of  By: Nils Pyle CMA ([place]), Mearl Latin    . Shortness of breath    hx lung ca   . Status post chemotherapy 2003   CARBOPLATIN/PACLITAXEL /STATUS POST CLINICAL TRAIL OF CELEBREX AND IRESSA AT [facility] FOR 1 YEAR  . Status post radiation therapy 2003   LEFT LUNG  . Status post radiation therapy 11/07/2005   WHOLE BRAIN: DR Larkin [facility]  . Status post radiation therapy 06/02/2008   GAMMA KNIFE OF RESECTED CAVITAY  . Thyroid adenoma    ?  [name] Thyroid cancer ([place]) 10/18/11 bx  adenoid nodules   . Yeast infection     Patient Active Problem List   Diagnosis Date Noted  . Pressure injury of skin 06/03/2020  . Cellulitis 06/03/2020  . SVT (supraventricular tachycardia) ([place]) 03/26/2020  . Normocytic anemia 03/24/2020  . Urinary tract infection without hematuria   . AMS (altered mental status) 03/23/2020  . Hypokalemia 03/23/2020  . Prolonged Q-T interval on ECG 03/23/2020  . DNR (do not resuscitate) 03/23/2020  . MRSA bacteremia 03/23/2020  . Acute combined systolic and diastolic heart failure ([place])   . Elevated troponin   . Dilated cardiomyopathy ([place])   . Coronary artery disease involving native coronary artery of native heart without angina pectoris   . Congestive heart failure (CHF) ([place]) 02/14/2020  . Pyelonephritis 09/10/2019  . Acute blood loss anemia 06/12/2019   . Closed fracture of distal end of right femur, initial encounter ([place]) 06/11/2019  . Closed fracture of lateral portion of right tibial plateau 06/11/2019  . History of DVT of lower extremity 06/11/2019  . Blurry vision 01/06/2019  . Headache 07/24/2016  . Cough 08/24/2015  . Chronic diarrhea 08/24/2015  . Sepsis ([place])   . Encounter for antineoplastic chemotherapy 01/31/2015  . Urinary retention 10/20/2014  . Ileus ([place]) 10/20/2014  . Cellulitis of leg, right 10/20/2014  . Fracture of right proximal fibula   . Ankle fracture 10/02/2014  . Ankle fracture 10/02/2014  . Lung cancer ([place]) 10/02/2014  . Closed fibular fracture 10/02/2014  . Dementia ([place]) 10/02/2014  . Lower urinary tract infectious disease 10/02/2014  . Reactive airway disease 10/02/2014  . Fall 10/02/2014  . Closed left ankle fracture 10/01/2014  . Right fibular fracture 10/01/2014  . Lung metastasis ([place])   . LESION, ANUS 04/26/2009  . DIARRHEA 04/26/2009  . Primary cancer of right upper lobe of lung ([place]) 04/22/2009  . Malignant neoplasm of brain ([place]) 04/22/2009  . VITAMIN D DEFICIENCY 04/22/2009  . ANAL FISTULA 04/22/2009  . OSTEOPOROSIS 04/22/2009    Past Surgical History:  Procedure Laterality Date  . BRAIN SURGERY    . INTRAVASCULAR ULTRASOUND/IVUS N/A 02/16/2020   Procedure: Intravascular Ultrasound/IVUS;  Surgeon: Leonie Man, MD;  Location: [facility];  Service: Cardiovascular;  Laterality: N/A;  . LEFT LOWER LOBECTOMY  05/2001  . LEFT OCCIPITAL CRANIOTOMY  09/21/2005   tumor  . LUNG LOBECTOMY    . NECK SURGERY    . ORIF ANKLE FRACTURE Left 10/02/2014   Procedure: OPEN REDUCTION INTERNAL FIXATION (ORIF) ANKLE FRACTURE;  Surgeon: Alta Corning, MD;  Location: [facility];  Service: Orthopedics;  Laterality: Left;  . ORIF FEMUR FRACTURE Right 06/11/2019   Procedure: OPEN REDUCTION INTERNAL FIXATION (ORIF) DISTAL FEMUR FRACTURE;  Surgeon: Renette Butters, MD;  Location: [facility];  Service:  Orthopedics;  Laterality: Right;  . ORIF TIBIA PLATEAU Right 06/11/2019   Procedure: Open Reduction Internal Fixation (Orif) Tibial Plateau;  Surgeon: Renette Butters, MD;  Location: [facility];  Service: Orthopedics;  Laterality: Right;  . RADICAL NECK DISSECTION  10/18/2011   Procedure: RADICAL NECK DISSECTION;  Surgeon: Izora Gala, MD;  Location: [facility];  Service: ENT;  Laterality: Right;  RIGHT MODIFIED NECK DISSECTION /POSSIBLE RIGHT THYROIDECTOM  . RIGHT/LEFT HEART CATH AND CORONARY ANGIOGRAPHY N/A 02/16/2020   Procedure: RIGHT/LEFT HEART CATH AND CORONARY ANGIOGRAPHY;  Surgeon: Leonie Man, MD;  Location: [facility];  Service: Cardiovascular;  Laterality: N/A;  . TEE WITHOUT CARDIOVERSION N/A 03/28/2020   Procedure: TRANSESOPHAGEAL ECHOCARDIOGRAM (TEE);  Surgeon: Buford Dresser, MD;  Location: [facility];  Service: Cardiovascular;  Laterality: N/A;  . THYROIDECTOMY  10/18/2011   Procedure: THYROIDECTOMY WITH RADICAL NECK DISSECTION;  Surgeon: Izora Gala, MD;  Location: [facility];  Service: ENT;  Laterality: Right;     OB History    Gravida  2   Para  0   Term  0   Preterm  0   AB  0   Living  2     SAB  0   TAB  0   Ectopic  0   Multiple      Live Births  2           Family History  Problem Relation Age of Onset  . Gait disorder Mother   . Cancer Mother        Meningioma  . Cancer Father        Pancreatic  . Heart failure Sister   . Cancer Maternal Aunt        menigeoma    Social History   Tobacco Use  . Smoking status: Never Smoker  . Smokeless tobacco: Never Used  . Tobacco comment: smoked few years in college  Vaping Use  . Vaping Use: Never used  Substance Use Topics  . Alcohol use: No  . Drug use: No    Home Medications Prior to Admission medications   Medication Sig Start Date End Date Taking? Authorizing Provider  acetaminophen (TYLENOL) 500 MG tablet Take 1,000 mg by mouth every 6 (six) hours as needed for mild pain,  fever or headache.   Yes [provider]  Ascorbic Acid (VITAMIN C) 100 MG tablet Take 100 mg by mouth every evening. Gummie   Yes [provider]  atorvastatin (LIPITOR) 80 MG tablet Take 1 tablet (80 mg total) by mouth at bedtime. 02/18/20  Yes Nita Sells, MD  Cholecalciferol (D3 PO) Take 1 capsule by mouth daily.   Yes [provider]  clopidogrel (PLAVIX) 75 MG tablet Take 1 tablet (75 mg total) by mouth daily with breakfast. 05/24/20  Yes Buford Dresser, MD  diphenhydrAMINE (BENADRYL) 25 MG tablet Take 25 mg by mouth every 6 (six) hours as needed for allergies.   Yes [provider]  donepezil (ARICEPT) 10 MG tablet Take 10 mg by mouth at bedtime.   Yes [provider]  erlotinib (TARCEVA) 150 MG tablet TAKE 1 TABLET (150 MG TOTAL) BY MOUTH DAILY. TAKE ON AN EMPTY STOMACH 1 HOUR BEFORE MEALS OR 2 HOURS AFTER. 04/15/20  Yes Curt Bears, MD  loperamide (IMODIUM A-D) 2 MG tablet Take 4 mg by mouth in the morning and at bedtime.   Yes [provider]  MAGNESIUM PO Take 1 tablet by mouth daily.   Yes [provider]  metoprolol succinate (TOPROL-XL) 25 MG 24 hr tablet Take 1 tablet (25 mg total) by mouth daily. 05/24/20  Yes Buford Dresser, MD  Multiple Vitamin (MULTI-VITAMINS) TABS Take 1 tablet by mouth every evening.    Yes [provider]  potassium chloride SA (KLOR-CON) 20 MEQ tablet Take 20 mEq by mouth daily.   Yes [provider]  prochlorperazine (COMPAZINE) 10 MG tablet Take 1 tablet (10 mg total) by mouth every 6 (six) hours as needed for nausea or vomiting. 05/30/20  Yes Curt Bears, MD  vitamin B-12 (CYANOCOBALAMIN) 1000 MCG tablet Take 1,000 mcg by mouth every evening.   Yes [provider]  XARELTO 20 MG TABS tablet Take 1 tablet (  20 mg total) by mouth every evening. 12/15/19  Yes [name], MD  zinc oxide (BALMEX) 11.3 % CREA cream Apply 1 application topically  as needed (lesions/buttocks).   Yes [provider]  saccharomyces boulardii (FLORASTOR) 250 MG capsule Take 1 capsule (250 mg total) by mouth 2 (two) times daily. Patient not taking: Reported on 06/03/2020 03/03/20   [name], MD    Allergies    Keflex [cephalexin]  Review of Systems   Review of Systems  Constitutional: Negative for chills, diaphoresis, fatigue and fever.  HENT: Negative for congestion, sore throat and trouble swallowing.   Eyes: Negative for pain and visual disturbance.  Respiratory: Negative for cough, shortness of breath and wheezing.   Cardiovascular: Negative for chest pain, palpitations and leg swelling.  Gastrointestinal: Positive for abdominal pain, diarrhea, nausea and vomiting. Negative for abdominal distention.  Genitourinary: Negative for difficulty urinating.  Musculoskeletal: Negative for back pain, neck pain and neck stiffness.  Skin: Positive for wound. Negative for pallor.  Neurological: Negative for dizziness, speech difficulty, weakness and headaches.  Psychiatric/Behavioral: Negative for confusion.    Physical Exam Updated Vital Signs BP (!) 114/48   Pulse (!) 104   Temp 98.8 F (37.1 C) (Rectal)   Resp 19   SpO2 100%   Physical Exam Constitutional:      General: She is not in acute distress.    Appearance: Normal appearance. She is ill-appearing. She is not toxic-appearing or diaphoretic.     Comments: Patient is chronically ill-appearing, will desat to 88% on room air.  96% on 2 L.  Is able to speak to me in full sentences, does not appear to be in respiratory distress.  HENT:     Head: Normocephalic and atraumatic.     Mouth/Throat:     Mouth: Mucous membranes are moist.     Pharynx: Oropharynx is clear.  Eyes:     General: No scleral icterus.    Extraocular Movements: Extraocular movements intact.     Pupils: Pupils are equal, round, and reactive to light.  Cardiovascular:     Rate and Rhythm: Regular rhythm.  Tachycardia present.     Pulses: Normal pulses.     Heart sounds: Normal heart sounds.  Pulmonary:     Effort: Pulmonary effort is normal. Tachypnea present. No respiratory distress.     Breath sounds: Normal breath sounds. No stridor. No wheezing, rhonchi or rales.  Chest:     Chest wall: No tenderness.  Abdominal:     General: Abdomen is flat. There is no distension.     Palpations: Abdomen is soft.     Tenderness: There is no abdominal tenderness. There is no guarding or rebound.  Musculoskeletal:        General: No swelling or tenderness. Normal range of motion.     Cervical back: Normal range of motion and neck supple. No rigidity.     Right lower leg: No edema.     Left lower leg: No edema.  Skin:    General: Skin is warm and dry.     Capillary Refill: Capillary refill takes less than 2 seconds.     Coloration: Skin is not pale.     Findings: Lesion present.     Comments: Patient with multiple wounds on left side of body, do appear a stage I ulcers where patient is having pain.  Neurological:     General: No focal deficit present.     Mental Status: She is alert and  oriented to person, place, and time.     Cranial Nerves: No cranial nerve deficit.     Sensory: No sensory deficit.     Motor: No weakness.  Psychiatric:        Mood and Affect: Mood normal.        Behavior: Behavior normal.     ED Results / Procedures / Treatments   Labs (all labs ordered are listed, but only abnormal results are displayed) Labs Reviewed  LACTIC ACID, PLASMA - Abnormal; Notable for the following components:      Result Value   Lactic Acid, Venous 2.2 (*)    All other components within normal limits  LACTIC ACID, PLASMA - Abnormal; Notable for the following components:   Lactic Acid, Venous 2.2 (*)    All other components within normal limits  COMPREHENSIVE METABOLIC PANEL - Abnormal; Notable for the following components:   Potassium 2.4 (*)    Glucose, Bld 123 (*)    Total Protein  5.8 (*)    Albumin 2.5 (*)    All other components within normal limits  CBC WITH DIFFERENTIAL/PLATELET - Abnormal; Notable for the following components:   WBC 13.7 (*)    RBC 3.66 (*)    Hemoglobin 11.2 (*)    HCT 35.4 (*)    RDW 17.4 (*)    Platelets 434 (*)    Neutro Abs 11.7 (*)    Abs Immature Granulocytes 0.08 (*)    All other components within normal limits  PROTIME-INR - Abnormal; Notable for the following components:   Prothrombin Time 40.3 (*)    INR 4.3 (*)    All other components within normal limits  APTT - Abnormal; Notable for the following components:   aPTT 46 (*)    All other components within normal limits  URINALYSIS, ROUTINE W REFLEX MICROSCOPIC - Abnormal; Notable for the following components:   APPearance TURBID (*)    Hgb urine dipstick LARGE (*)    Protein, ur 100 (*)    Leukocytes,Ua LARGE (*)    RBC / HPF >50 (*)    WBC, UA >50 (*)    Bacteria, UA MANY (*)    All other components within normal limits  CULTURE, BLOOD (ROUTINE X 2)  RESPIRATORY PANEL BY RT PCR (FLU A&B, COVID)  URINE CULTURE  CULTURE, BLOOD (ROUTINE X 2)  LIPASE, BLOOD  LACTIC ACID, PLASMA    EKG EKG Interpretation  Date/Time:  Friday June 03 2020 10:25:36 EDT Ventricular Rate:  118 PR Interval:    QRS Duration: 160 QT Interval:  351 QTC Calculation: 488 R Axis:   9 Text Interpretation: Atrial fibrillation Ventricular premature complex Left bundle branch block afib is new from prior Confirmed by Lacretia Leigh ([id number]) on 06/03/2020 11:07:35 AM   Radiology CT Angio Chest PE W/Cm &/Or Wo Cm  Result Date: 06/03/2020 CLINICAL DATA:  High probability of pulmonary embolus. Left-sided abdominal pain. EXAM: CT ANGIOGRAPHY CHEST CT ABDOMEN AND PELVIS WITH CONTRAST TECHNIQUE: Multidetector CT imaging of the chest was performed using the standard protocol during bolus administration of intravenous contrast. Multiplanar CT image reconstructions and MIPs were obtained to evaluate  the vascular anatomy. Multidetector CT imaging of the abdomen and pelvis was performed using the standard protocol during bolus administration of intravenous contrast. CONTRAST:  180mL OMNIPAQUE IOHEXOL 350 MG/ML SOLN COMPARISON:  April 26, 2020. FINDINGS: CTA CHEST FINDINGS Cardiovascular: Satisfactory opacification of the pulmonary arteries to the segmental level. No evidence of pulmonary embolism. Mild cardiomegaly  is noted. No pericardial effusion. Atherosclerosis of thoracic aorta is noted without aneurysm formation. Mediastinum/Nodes: No enlarged mediastinal, hilar, or axillary lymph nodes. Thyroid gland, trachea, and esophagus demonstrate no significant findings. Lungs/Pleura: No pneumothorax is noted. Status post left lower lobectomy. Scarring and other postsurgical changes are noted posteriorly. Small left pleural effusion is noted. Musculoskeletal: Postsurgical changes are seen involving the left ribs. Moderate thoracic kyphosis is noted. No acute osseous abnormality is noted. Review of the MIP images confirms the above findings. CT ABDOMEN and PELVIS FINDINGS Hepatobiliary: No focal liver abnormality is seen. No gallstones, gallbladder wall thickening, or biliary dilatation. Pancreas: Unremarkable. No pancreatic ductal dilatation or surrounding inflammatory changes. Spleen: Normal in size without focal abnormality. Adrenals/Urinary Tract: Adrenal glands are unremarkable. Kidneys are normal, without renal calculi, focal lesion, or hydronephrosis. Bladder is unremarkable. Stomach/Bowel: Stomach is within normal limits. Appendix appears normal. No evidence of bowel wall thickening, distention, or inflammatory changes. Vascular/Lymphatic: Aortic atherosclerosis. No enlarged abdominal or pelvic lymph nodes. Reproductive: At least 2 uterine fibroids are noted. No adnexal abnormality is noted. Other: No abdominal wall hernia or abnormality. No abdominopelvic ascites. Musculoskeletal: No acute or significant  osseous findings. Review of the MIP images confirms the above findings. IMPRESSION: 1. No definite evidence of pulmonary embolus. 2. Status post left lower lobectomy with postsurgical changes seen posteriorly. 3. Small left pleural effusion is noted. 4. Aortic atherosclerosis. 5. At least 2 uterine fibroids are noted. 6. No acute abnormality seen in the abdomen or pelvis. Aortic Atherosclerosis (ICD10-I70.0). Electronically Signed   By: Marijo Conception M.D.   On: 06/03/2020 13:43   CT Abdomen Pelvis W Contrast  Result Date: 06/03/2020 CLINICAL DATA:  High probability of pulmonary embolus. Left-sided abdominal pain. EXAM: CT ANGIOGRAPHY CHEST CT ABDOMEN AND PELVIS WITH CONTRAST TECHNIQUE: Multidetector CT imaging of the chest was performed using the standard protocol during bolus administration of intravenous contrast. Multiplanar CT image reconstructions and MIPs were obtained to evaluate the vascular anatomy. Multidetector CT imaging of the abdomen and pelvis was performed using the standard protocol during bolus administration of intravenous contrast. CONTRAST:  179mL OMNIPAQUE IOHEXOL 350 MG/ML SOLN COMPARISON:  April 26, 2020. FINDINGS: CTA CHEST FINDINGS Cardiovascular: Satisfactory opacification of the pulmonary arteries to the segmental level. No evidence of pulmonary embolism. Mild cardiomegaly is noted. No pericardial effusion. Atherosclerosis of thoracic aorta is noted without aneurysm formation. Mediastinum/Nodes: No enlarged mediastinal, hilar, or axillary lymph nodes. Thyroid gland, trachea, and esophagus demonstrate no significant findings. Lungs/Pleura: No pneumothorax is noted. Status post left lower lobectomy. Scarring and other postsurgical changes are noted posteriorly. Small left pleural effusion is noted. Musculoskeletal: Postsurgical changes are seen involving the left ribs. Moderate thoracic kyphosis is noted. No acute osseous abnormality is noted. Review of the MIP images confirms the  above findings. CT ABDOMEN and PELVIS FINDINGS Hepatobiliary: No focal liver abnormality is seen. No gallstones, gallbladder wall thickening, or biliary dilatation. Pancreas: Unremarkable. No pancreatic ductal dilatation or surrounding inflammatory changes. Spleen: Normal in size without focal abnormality. Adrenals/Urinary Tract: Adrenal glands are unremarkable. Kidneys are normal, without renal calculi, focal lesion, or hydronephrosis. Bladder is unremarkable. Stomach/Bowel: Stomach is within normal limits. Appendix appears normal. No evidence of bowel wall thickening, distention, or inflammatory changes. Vascular/Lymphatic: Aortic atherosclerosis. No enlarged abdominal or pelvic lymph nodes. Reproductive: At least 2 uterine fibroids are noted. No adnexal abnormality is noted. Other: No abdominal wall hernia or abnormality. No abdominopelvic ascites. Musculoskeletal: No acute or significant osseous findings. Review of the MIP images  confirms the above findings. IMPRESSION: 1. No definite evidence of pulmonary embolus. 2. Status post left lower lobectomy with postsurgical changes seen posteriorly. 3. Small left pleural effusion is noted. 4. Aortic atherosclerosis. 5. At least 2 uterine fibroids are noted. 6. No acute abnormality seen in the abdomen or pelvis. Aortic Atherosclerosis (ICD10-I70.0). Electronically Signed   By: Marijo Conception M.D.   On: 06/03/2020 13:43   DG Chest Port 1 View  Result Date: 06/03/2020 CLINICAL DATA:  Sepsis. EXAM: PORTABLE CHEST 1 VIEW COMPARISON:  March 22, 2020.  April 26, 2020. FINDINGS: Stable cardiomegaly. No pneumothorax is noted. Right lung is clear. Mild left pleural effusion is noted with associated left basilar atelectasis or infiltrate. Postsurgical changes are noted in the left hilar region. Old left rib fractures are noted. IMPRESSION: Mild left pleural effusion is noted with associated left basilar atelectasis or infiltrate. No pneumothorax is noted. Electronically  Signed   By: Marijo Conception M.D.   On: 06/03/2020 11:26    Procedures .Critical Care Performed by: Alfredia Client, PA-C Authorized by: Alfredia Client, PA-C   Critical care provider statement:    Critical care time (minutes):  45   Critical care was necessary to treat or prevent imminent or life-threatening deterioration of the following conditions:  Sepsis   Critical care was time spent personally by me on the following activities:  Discussions with consultants, evaluation of patient's response to treatment, examination of patient, ordering and performing treatments and interventions, ordering and review of laboratory studies, ordering and review of radiographic studies, pulse oximetry, re-evaluation of patient's condition, obtaining history from patient or surrogate and review of old charts   (including critical care time)  Medications Ordered in ED Medications  potassium chloride SA (KLOR-CON) CR tablet 40 mEq (has no administration in time range)  sodium chloride 0.9 % bolus 500 mL (0 mLs Intravenous Stopped 06/03/20 1155)  potassium chloride 10 mEq in 100 mL IVPB (0 mEq Intravenous Stopped 06/03/20 1429)  meropenem (MERREM) 1 g in sodium chloride 0.9 % 100 mL IVPB (0 g Intravenous Stopped 06/03/20 1410)  vancomycin (VANCOCIN) IVPB 1000 mg/200 mL premix (0 mg Intravenous Stopped 06/03/20 1334)  sodium chloride 0.9 % bolus 250 mL (0 mLs Intravenous Stopped 06/03/20 1311)  iohexol (OMNIPAQUE) 350 MG/ML injection 100 mL (100 mLs Intravenous Contrast Given 06/03/20 1247)  sodium chloride (PF) 0.9 % injection (  Given 06/03/20 1313)    ED Course  I have reviewed the triage vital signs and the nursing notes.  Pertinent labs & imaging results that were available during my care of the patient were reviewed by me and considered in my medical decision making (see chart for details).    MDM Rules/Calculators/A&P                         JAMONI BROADFOOT is a 74 y.o. 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 fib that presents emergency department today for possible sepsis via EMS.  Patient on 2 L of oxygen, no respiratory distress.  Patient tachycardic to 118, normotensive, afebrile.  Normal rectal temp.  Thinks source could be coming from lungs due to new oxygen requirement, could also be coming from multiple wounds on left side, work-up pending.  EKG in A. fib, patient does have history of this and is  on Xarelto.  Has been compliant.  Labs remarkable for lactic acid of 2.2, urinalysis suggestive of UTI, potassium 2.4, leukocytosis of 13.7.  INR 4.3 chest x-ray atelectasis versus infiltrate, code sepsis activated.  IV potassium started.  Code sepsis activated, pressures are normotensive.  Giving IV fluids slowly since patient has EF of 25%.  After 51mL normal saline given, did reassess and patient does not appear fluid overloaded.  Will give another 250 mL, do not think that we need 30cc/kg at this time since patient blood pressure is normotensive.  1200 spoke to pharmacy, check note who suggested IV meropenem and vancomycin since patient has had ESBL and also has had hospital admission in the past 2 months with MRSA.  CT PE study negative, no infiltrate seen.  CT abdomen negative for acute intra-abdominal disease.  14:00 spoke to hospitalist, Cruzita Lederer, who will accept the patient for urosepsis.  The patient appears reasonably stabilized for admission considering the current resources, flow, and capabilities available in the ED at this time, and I doubt any other [facility] requiring further screening and/or treatment in the ED prior to admission.  I discussed this case with my attending physician who cosigned this note including patient's presenting symptoms, physical exam, and planned diagnostics and interventions. Attending  physician stated agreement with plan or made changes to plan which were implemented.   Attending physician assessed patient at bedside.  Final Clinical Impression(s) / ED Diagnoses Final diagnoses:  Sepsis without acute organ dysfunction, due to unspecified organism [facility])    Rx / [place] Orders ED Discharge Orders    None       Alfredia Client, PA-C 06/03/20 1454    Lacretia Leigh, MD 06/09/20 1146

## 2020-06-03 NOTE — ED Notes (Signed)
Pt husband brought in pt home medications

## 2020-06-04 DIAGNOSIS — C799 Secondary malignant neoplasm of unspecified site: Secondary | ICD-10-CM

## 2020-06-04 DIAGNOSIS — L03312 Cellulitis of back [any part except buttock]: Secondary | ICD-10-CM | POA: Diagnosis not present

## 2020-06-04 DIAGNOSIS — I5042 Chronic combined systolic (congestive) and diastolic (congestive) heart failure: Secondary | ICD-10-CM | POA: Diagnosis not present

## 2020-06-04 DIAGNOSIS — A419 Sepsis, unspecified organism: Principal | ICD-10-CM

## 2020-06-04 LAB — BLOOD CULTURE ID PANEL (REFLEXED) - BCID2

## 2020-06-04 LAB — COMPREHENSIVE METABOLIC PANEL
ALT: 17 U/L (ref 0–44)
AST: 37 U/L (ref 15–41)
Albumin: 2 g/dL — ABNORMAL LOW (ref 3.5–5.0)
Alkaline Phosphatase: 95 U/L (ref 38–126)
Anion gap: 11 (ref 5–15)
BUN: 11 mg/dL (ref 8–23)
CO2: 22 mmol/L (ref 22–32)
Calcium: 9.1 mg/dL (ref 8.9–10.3)
Chloride: 104 mmol/L (ref 98–111)
Creatinine, Ser: 0.75 mg/dL (ref 0.44–1.00)
GFR, Estimated: >60 mL/min (ref >60)
Glucose, Bld: 105 mg/dL — ABNORMAL HIGH (ref 70–99)
Potassium: 5.5 mmol/L — ABNORMAL HIGH (ref 3.5–5.1)
Sodium: 137 mmol/L (ref 135–145)
Total Bilirubin: 1.8 mg/dL — ABNORMAL HIGH (ref 0.3–1.2)
Total Protein: 5.1 g/dL — ABNORMAL LOW (ref 6.5–8.1)

## 2020-06-04 LAB — CBC
HCT: 34.8 % — ABNORMAL LOW (ref 36.0–46.0)
Hemoglobin: 11.3 g/dL — ABNORMAL LOW (ref 12.0–15.0)
MCH: 30.3 pg (ref 26.0–34.0)
MCHC: 32.5 g/dL (ref 30.0–36.0)
MCV: 93.3 fL (ref 80.0–100.0)
Platelets: 362 10*3/uL (ref 150–400)
RBC: 3.73 MIL/uL — ABNORMAL LOW (ref 3.87–5.11)
RDW: 17.6 % — ABNORMAL HIGH (ref 11.5–15.5)
WBC: 13 10*3/uL — ABNORMAL HIGH (ref 4.0–10.5)
nRBC: 0 % (ref 0.0–0.2)

## 2020-06-04 MED ORDER — BOOST / RESOURCE BREEZE PO LIQD CUSTOM
1.0000 | Freq: Two times a day (BID) | ORAL | Status: DC
  Administered 2020-06-04 – 2020-06-13 (×17): 1 via ORAL

## 2020-06-04 MED ORDER — UNJURY CHICKEN SOUP POWDER
1.0000 | Freq: Two times a day (BID) | ORAL | Status: DC
  Administered 2020-06-04 – 2020-06-05 (×2): 1 via ORAL
  Filled 2020-06-04 (×14): qty 27

## 2020-06-04 MED ORDER — GERHARDT'S BUTT CREAM
TOPICAL_CREAM | Freq: Two times a day (BID) | CUTANEOUS | Status: DC
  Administered 2020-06-04 – 2020-06-08 (×3): 1 via TOPICAL
  Filled 2020-06-04 (×7): qty 1

## 2020-06-04 MED ORDER — SODIUM CHLORIDE 0.9 % IV BOLUS
500.0000 mL | Freq: Once | INTRAVENOUS | Status: AC
  Administered 2020-06-04: 500 mL via INTRAVENOUS

## 2020-06-04 NOTE — Plan of Care (Signed)

## 2020-06-04 NOTE — Progress Notes (Signed)
Initial Nutrition Assessment  DOCUMENTATION CODES:   Not applicable  INTERVENTION:  CIB on breakfast tray, each supplement with 237 ml whole milk provides 280 kcal and 13 grams of protein  Boost Breeze po BID, each supplement provides 250 kcal and 9 grams of protein  Unjury chicken soup BID, each 27 gram packet provides 100 kcal and 21 grams protein  Magic cup BID with meals, each supplement provides 290 kcal and 9 grams of protein  Pt at risk for refeeding given very poor po intake x 2 weeks, recommend monitoring magnesium, potassium, and phosphorus daily as po intake improves  NUTRITION DIAGNOSIS:   Inadequate oral intake related to nausea, vomiting, diarrhea, poor appetite as evidenced by per patient/family report.    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 Pt found to have large area of cellulitis spanning from buttocks to shoulder and admitted for sepsis due to cellulitis and possible UTI.  RD working remotely.  Spoke with pt via phone this morning, pt sounds weak but reports feeling a some better today. She reports nausea has improved and tolerated eggs, grits, and coffee for breakfast. Pt endorsed very poor po at home over the past couple of weeks associated with nausea as well as episodes of vomiting and  ongoing diarrhea. She reports trying Ensure supplements in the past and did not like them. She is agreeable to trying CIB, [employer], as well as Unjury [employer].   Per chart, weights have trended down 23.8 lbs (15.5%) in 2 months. Since January, her weights have decreased by 49.72 lbs (60.9%); significant. Highly suspect severe malnutrition, however unable to identify without exam, will plan to complete at follow up.   Medications reviewed and include: Vit D, Aricept, Mag-ox, MVI, Florastor, B12, Vit C, Merrem, Vancomycin  Labs: K 5.5 (H) s/p aggressive repletion, WBC 13 (H)   NUTRITION - FOCUSED PHYSICAL EXAM: Unable to complete at this time, RD working remotely.  Diet Order:   Diet Order            Diet regular Room service appropriate? Yes; Fluid consistency: Thin  Diet effective now                 EDUCATION NEEDS:   Education needs have been addressed  Skin:  Skin Assessment: Skin Integrity Issues: Skin Integrity Issues:: Stage II, Other (Comment), DTI DTI: heel Stage II: lower back Other: open wound; bleeding; back; right, upper, lateral; IAD associated dermatitis; buttocks; MASD; bilateral breast  Last BM:  10/16-type 7  Height:   Ht Readings from Last 1 Encounters:  04/26/20 5\' 7"  (1.702 m)    Weight:   Wt Readings from Last 1 Encounters:  06/03/20 59 kg    BMI:  Body mass index is 20.36 kg/m.  Estimated Nutritional Needs:   Kcal:  1696-7893 (32-35 kcal/kg)  Protein:  95-106  Fluid:  >/=  1.8 L/day  Lajuan Lines, RD, LDN Clinical Nutrition After Hours/Weekend Pager # in [place]

## 2020-06-04 NOTE — NC FL2 (Signed)
[place] LEVEL OF CARE SCREENING TOOL     IDENTIFICATION  Patient Name: Valerie [name] Birthdate: 05-16-1946 Sex: female Admission Date (Current Location): 06/03/2020  [facility] and [place] Number:  Herbalist and Address:  [facility],  [place] [address], [place]      Provider Number: [id number]  Attending Physician Name and Address:  Caren Griffins, MD  Relative Name and Phone Number:  Alexi, Dorminey (Spouse) [phone]    Current Level of Care: Hospital Recommended Level of Care: [place] Prior Approval Number:    Date Approved/Denied:   PASRR Number: [id number]  Discharge Plan: SNF    Current Diagnoses: Patient Active Problem List   Diagnosis Date Noted  . Pressure injury of skin 06/03/2020  . Cellulitis 06/03/2020  . SVT (supraventricular tachycardia) ([place]) 03/26/2020  . Normocytic anemia 03/24/2020  . Urinary tract infection without hematuria   . AMS (altered mental status) 03/23/2020  . Hypokalemia 03/23/2020  . Prolonged Q-T interval on ECG 03/23/2020  . DNR (do not resuscitate) 03/23/2020  . MRSA bacteremia 03/23/2020  . Acute combined systolic and diastolic heart failure ([place])   . Elevated troponin   . Dilated cardiomyopathy ([place])   . Coronary artery disease involving native coronary artery of native heart without angina pectoris   . Congestive heart failure (CHF) ([place]) 02/14/2020  . Pyelonephritis 09/10/2019  . Acute blood loss anemia 06/12/2019  . Closed fracture of distal end of right femur, initial encounter ([place]) 06/11/2019  . Closed fracture of lateral portion of right tibial plateau 06/11/2019  . History of DVT of lower extremity 06/11/2019  . Blurry vision 01/06/2019  . Headache 07/24/2016  . Cough 08/24/2015  . Chronic diarrhea 08/24/2015  . Sepsis ([place])   . Encounter for antineoplastic chemotherapy 01/31/2015  . Urinary retention 10/20/2014  . Ileus ([place]) 10/20/2014   . Cellulitis of leg, right 10/20/2014  . Fracture of right proximal fibula   . Ankle fracture 10/02/2014  . Ankle fracture 10/02/2014  . Lung cancer ([place]) 10/02/2014  . Closed fibular fracture 10/02/2014  . Dementia ([place]) 10/02/2014  . Lower urinary tract infectious disease 10/02/2014  . Reactive airway disease 10/02/2014  . Fall 10/02/2014  . Closed left ankle fracture 10/01/2014  . Right fibular fracture 10/01/2014  . Lung metastasis ([place])   . LESION, ANUS 04/26/2009  . DIARRHEA 04/26/2009  . Primary cancer of right upper lobe of lung ([place]) 04/22/2009  . Malignant neoplasm of brain ([place]) 04/22/2009  . VITAMIN D DEFICIENCY 04/22/2009  . ANAL FISTULA 04/22/2009  . OSTEOPOROSIS 04/22/2009    Orientation RESPIRATION BLADDER Height & Weight     Self, Time, Situation, Place  Normal External catheter Weight: 59 kg Height:     BEHAVIORAL SYMPTOMS/MOOD NEUROLOGICAL BOWEL NUTRITION STATUS   (none)  (none) Incontinent Diet (see d/c summary)  AMBULATORY STATUS COMMUNICATION OF NEEDS Skin   Extensive Assist Verbally  (pressure injury: back, entire, and buttocks, heel   Wounds: back, right upper, buttocks right, foot r, breast L and R)                       Personal Care Assistance Level of Assistance  Bathing, Feeding, Dressing Bathing Assistance: Maximum assistance Feeding assistance: Limited assistance Dressing Assistance: Maximum assistance     Functional Limitations Info  Sight, Hearing, Speech Sight Info: Adequate Hearing Info: Adequate Speech Info: Adequate    SPECIAL CARE FACTORS FREQUENCY  PT (By licensed PT)  PT Frequency: 5X/W              Contractures Contractures Info: Not present    Additional Factors Info  Code Status, Allergies Code Status Info: DNR Allergies Info: Keflex           Current Medications (06/04/2020):  This is the current hospital active medication list Current Facility-Administered Medications  Medication Dose Route  Frequency Provider Last Rate Last Admin  . acetaminophen (TYLENOL) tablet 1,000 mg  1,000 mg Oral Q6H PRN Caren Griffins, MD   1,000 mg at 06/04/20 0836  . atorvastatin (LIPITOR) tablet 80 mg  80 mg Oral QHS Caren Griffins, MD   80 mg at 06/03/20 2131  . cholecalciferol (VITAMIN D) tablet 2,000 Units  50 mcg Oral Daily Caren Griffins, MD   2,000 Units at 06/04/20 0836  . clopidogrel (PLAVIX) tablet 75 mg  75 mg Oral Q breakfast Caren Griffins, MD   75 mg at 06/04/20 4825  . donepezil (ARICEPT) tablet 10 mg  10 mg Oral QHS Caren Griffins, MD   10 mg at 06/03/20 2131  . feeding supplement (BOOST / RESOURCE BREEZE) liquid 1 Container  1 Container Oral BID BM Caren Griffins, MD      . Gerhardt's butt cream   Topical BID Caren Griffins, MD   Given at 06/04/20 1352  . magnesium oxide (MAG-OX) tablet 400 mg  400 mg Oral Daily Caren Griffins, MD   400 mg at 06/04/20 0837  . meropenem (MERREM) 1 g in sodium chloride 0.9 % 100 mL IVPB  1 g Intravenous Q12H Caren Griffins, MD 200 mL/hr at 06/04/20 1109 1 g at 06/04/20 1109  . metoprolol succinate (TOPROL-XL) 24 hr tablet 25 mg  25 mg Oral Daily Caren Griffins, MD   25 mg at 06/04/20 0037  . multivitamin with minerals tablet 1 tablet  1 tablet Oral QPM Caren Griffins, MD   1 tablet at 06/03/20 2131  . prochlorperazine (COMPAZINE) tablet 10 mg  10 mg Oral Q6H PRN Caren Griffins, MD      . protein supplement (UNJURY CHICKEN SOUP) powder 1 packet  1 packet Oral q12n4p Caren Griffins, MD   1 packet at 06/04/20 1352  . rivaroxaban (XARELTO) tablet 20 mg  20 mg Oral QPM Caren Griffins, MD   20 mg at 06/03/20 1825  . saccharomyces boulardii (FLORASTOR) capsule 250 mg  250 mg Oral BID Caren Griffins, MD   250 mg at 06/04/20 0836  . vancomycin (VANCOREADY) IVPB 500 mg/100 mL  500 mg Intravenous Q12H Caren Griffins, MD 100 mL/hr at 06/04/20 0919 500 mg at 06/04/20 0919  . vitamin B-12 (CYANOCOBALAMIN) tablet 1,000 mcg   1,000 mcg Oral QPM Caren Griffins, MD   1,000 mcg at 06/03/20 1825  . vitamin C (ASCORBIC ACID) tablet 250 mg  250 mg Oral QPM Caren Griffins, MD   250 mg at 06/03/20 2131     Discharge Medications: Please see discharge summary for a list of discharge medications.  Relevant Imaging Results:  Relevant Lab Results:   Additional Information SSN: [id number]  Trish Mage, LCSW

## 2020-06-04 NOTE — Evaluation (Signed)
Physical Therapy Evaluation Patient Details Name: Valerie [name] MRN: [id number] DOB: 07/12/1946 Today's Date: 06/04/2020   History of Present Illness  Pt is 74 yo female with hx including metastatic lung CA, adenocarcinoma with brain and neck mets, hx of craniotomy for tumor resection, whole brain radiation, R thyroidectomy with radical neck dissection, CHF, MRSA, and UTIs.  Pt presented to hospital with L sided flank/back pain as well as FTT and progressive weakness. Pt admitted with sepsis due to back cellulitis and possible UTI.  Clinical Impression   Pt admitted with above diagnosis. Pt from home and is non-ambulatory and largely bed bound.  She does have a hoyer lift that is utilized for transfers at times with family.  Pt is questionable historian and unable to provide full detail on PLOF and assist available at home. Today she required max-total assist of 2 for transfers.  Pt is likely near baseline but would benefit from PT to advance mobility in order to reduce caregiver burden and for positioning to relief pressure on back by transitioning to seated positions.  Pt currently with functional limitations due to the deficits listed below (see PT Problem List). Pt will benefit from skilled PT to increase their independence and safety with mobility to allow discharge to the venue listed below.       Follow Up Recommendations Other (comment);Supervision/Assistance - 24 hour (Pt appears to have 24 hr care at home.  Would recommend max HH services and increased personal care aides to reduce caregiver burden vs LTC)    Equipment Recommendations  None recommended by PT (has DME)    Recommendations for Other Services       Precautions / Restrictions Precautions Precautions: Fall Precaution Comments: wounds - entire back excoriated/superficial wounds/cellulitis      Mobility  Bed Mobility Overal bed mobility: Needs Assistance Bed Mobility: Supine to Sit     Supine to sit: +2 for  physical assistance;Max assist     General bed mobility comments: Pt able to work legs toward EOB slowly, but ultimately required max A of 2 to lift trunk using bed pad and scoot forward  Transfers Overall transfer level: Needs assistance   Transfers: Sit to/from Stand;Stand Pivot Transfers Sit to Stand: Max assist;+2 physical assistance Stand pivot transfers: +2 physical assistance;Total assist       General transfer comment: Pt was able to stand with max x 2, knees blocked, pad to facilitate.  Required total A x 2 to pivot as pt fatigued  Ambulation/Gait             General Gait Details: non ambulatory  Stairs            Wheelchair Mobility    Modified Rankin (Stroke Patients Only)       Balance Overall balance assessment: Needs assistance Sitting-balance support: Bilateral upper extremity supported Sitting balance-Leahy Scale: Poor Sitting balance - Comments: kyphotic but looses balance posteriorly - encouraged reaching forward to hands on knees but pt only able to maintain independent balance for short period of time; otherwise requiring min-mod A     Standing balance-Leahy Scale: Zero Standing balance comment: max A x 2 to maintain                             Pertinent Vitals/Pain Pain Assessment: Faces Faces Pain Scale: Hurts even more Pain Location: Back-wounds Pain Descriptors / Indicators: Grimacing Pain Intervention(s): Limited activity within patient's tolerance;Monitored during session;Repositioned  Home Living Family/patient expects to be discharged to:: Private residence Living Arrangements: Spouse/significant other Available Help at Discharge: Available 24 hours/day (possible HH aides?) Type of Home: House Home Access: [place]: Able to live on main level with bedroom/bathroom;Two level;Full bath on main level Home Equipment: Other (comment);Shower seat;Walker - 2 wheels;Wheelchair - Fish farm manager - 4 wheels Additional Comments: toilet riser; hoyer; bed pan    Prior Function Level of Independence: Needs assistance   Gait / Transfers Assistance Needed: non-ambulatory; reports was working with therapy on standing but uses hoyer with family  ADL's / Homemaking Assistance Needed: Has assist with ADLs-does bed baths during the week, on weekends reports children help to get in shower  Comments: Pt is questionable historian.  May have personal care aides or just [facility] therapy - pt unable to clarify and family not present     Hand Dominance   Dominant Hand: Right    Extremity/Trunk Assessment   Upper Extremity Assessment Upper Extremity Assessment: Generalized weakness    Lower Extremity Assessment Lower Extremity Assessment: Generalized weakness;RLE deficits/detail;LLE deficits/detail RLE Deficits / Details: ROM WFL; Unable to tolerate or follow MMT - demonstrating grossly 2/5 throughout LLE Deficits / Details: ROM WFL; Unable to tolerate or follow MMT commands - demonstrating grossly 3/5 throughout    Cervical / Trunk Assessment Cervical / Trunk Assessment: Kyphotic;Other exceptions Cervical / Trunk Exceptions: Extremely kyphotic with forward head  Communication   Communication: No difficulties  Cognition Arousal/Alertness: Awake/alert Behavior During Therapy: Flat affect Overall Cognitive Status: No family/caregiver present to determine baseline cognitive functioning                                 General Comments: Pt questionable historian and oriented to self only.  Followed simple commands with increased time.      General Comments General comments (skin integrity, edema, etc.): Pt with poor skin integrity on back (excoriated superficial wounds) -due to this utilized bed pad to facilitate transfers and gait belt not used.  Additionally, when postionined in chair - had hoyer pad in place but removed plastic stays and covered with bed pad.     Exercises     Assessment/Plan    PT Assessment Patient needs continued PT services  PT Problem List Decreased strength;Decreased mobility;Decreased range of motion;Decreased coordination;Decreased safety awareness;Decreased activity tolerance;Decreased balance;Decreased knowledge of use of DME       PT Treatment Interventions DME instruction;Therapeutic activities;Therapeutic exercise;Patient/family education;Balance training;Functional mobility training    PT Goals (Current goals can be found in the Care Plan section)  Acute Rehab PT Goals Patient Stated Goal: not stated PT Goal Formulation: With patient Time For Goal Achievement: 06/18/20 Potential to Achieve Goals: Poor    Frequency Min 2X/week   Barriers to discharge Decreased caregiver support unsure of exact level of supervision/aide at home    Co-evaluation PT/OT/SLP Co-Evaluation/Treatment: Yes Reason for Co-Treatment: Complexity of the patient's impairments (multi-system involvement);For patient/therapist safety PT goals addressed during session: Mobility/safety with mobility OT goals addressed during session: ADL's and self-care       AM-PAC PT "6 Clicks" Mobility  Outcome Measure Help needed turning from your back to your side while in a flat bed without using bedrails?: A Lot Help needed moving from lying on your back to sitting on the side of a flat bed without using bedrails?: Total Help needed moving to and from a bed to  a chair (including a wheelchair)?: Total Help needed standing up from a chair using your arms (e.g., wheelchair or bedside chair)?: Total Help needed to walk in hospital room?: Total Help needed climbing 3-5 steps with a railing? : Total 6 Click Score: 7    End of Session Equipment Utilized During Treatment: Other (comment) (bed pad to faciliate transfers - no gait belt due to wounds on back) Activity Tolerance: Patient limited by fatigue Patient left: in chair;with call bell/phone within  reach Nurse Communication: Mobility status;Need for lift equipment PT Visit Diagnosis: Muscle weakness (generalized) (M62.81);Unsteadiness on feet (R26.81)    Time: 1595-3967 PT Time Calculation (min) (ACUTE ONLY): 25 min   Charges:   PT Evaluation $PT Eval Low Complexity: 1 Low          Aviance [place], PT Acute Rehab Services Pager [phone] [facility] [phone]    Karlton Lemon 06/04/2020, 12:51 PM

## 2020-06-04 NOTE — TOC Initial Note (Signed)
Transition of Care [facility]) - Initial/Assessment Note    Patient Details  Name: Valerie [name] MRN: [id number] Date of Birth: [birth year]  Transition of Care [facility]) CM/SW Contact:    Trish Mage, LCSW Phone Number: 06/04/2020, 3:31 PM  Clinical Narrative:   Patient seen in follow up to PT recommendation of 24 hour care v SNF.  Spoke to husband, [name], who confirms that he is primary care giver for wife; they have several daughters in the area as well but it is primarily him.  He cites a Social worker, [employer], as providing medical oversight, but has no paid personal care service that helps out.  Has all needed DME.  He is interested in SNF only if she can get into [facility].  If not, he will plan on her return home as he has been caring for her prior to admission and knows the routine.  Bed search initiated. Insurance auth initiated. TOC will continue to follow during the course of hospitalization.                 Expected Discharge Plan:  (SNF v Home) Barriers to Discharge: SNF Pending bed offer   Patient Goals and CMS Choice     Choice offered to / list presented to : Spouse  Expected Discharge Plan and Services Expected Discharge Plan:  (SNF v Home)   Discharge Planning Services: CM Consult Post Acute Care Choice: [place] Living arrangements for the past 2 months: Single Family Home                                      Prior Living Arrangements/Services Living arrangements for the past 2 months: Single Family Home Lives with:: Spouse Patient language and need for interpreter reviewed:: Yes        Need for Family Participation in Patient Care: Yes (Comment) Care giver support system in place?: Yes (comment) Current home services: DME Criminal Activity/Legal Involvement Pertinent to Current Situation/Hospitalization: No - Comment as needed  Activities of Daily Living Home Assistive Devices/Equipment: Civil Service fast streamer, Wheelchair,  Wellsite geologist, Multimedia programmer chair with back ADL Screening (condition at time of admission) Patient's cognitive ability adequate to safely complete daily activities?: Yes Is the patient deaf or have difficulty hearing?: No Does the patient have difficulty seeing, even when wearing glasses/contacts?: Yes (needs new glasses per pt) Does the patient have difficulty concentrating, remembering, or making decisions?: Yes (" a little bit") Patient able to express need for assistance with ADLs?: Yes Does the patient have difficulty dressing or bathing?: Yes Independently performs ADLs?: No Communication: Independent Dressing (OT): Needs assistance Is this a change from baseline?: Pre-admission baseline Grooming: Needs assistance Is this a change from baseline?: Pre-admission baseline Feeding: Independent Bathing: Needs assistance Is this a change from baseline?: Pre-admission baseline Toileting: Needs assistance Is this a change from baseline?: Pre-admission baseline In/Out Bed: Dependent Is this a change from baseline?: Pre-admission baseline Walks in Home: Dependent Is this a change from baseline?: Pre-admission baseline Does the patient have difficulty walking or climbing stairs?: Yes Weakness of Legs: Both Weakness of Arms/Hands: Both  Permission Sought/Granted Permission sought to share information with : Family Supports Permission granted to share information with : No  Share Information with NAME: Blyden,Maurice (Spouse) [phone]           Emotional Assessment       Orientation: : Oriented to Self Alcohol /  Substance Use: Not Applicable Psych Involvement: No (comment)  Admission diagnosis:  Cellulitis [L03.90] Sepsis without acute organ dysfunction, due to unspecified organism [facility]) [A41.9] Patient Active Problem List   Diagnosis Date Noted  . Pressure injury of skin 06/03/2020  . Cellulitis 06/03/2020  . SVT (supraventricular tachycardia) ([place]) 03/26/2020  . Normocytic  anemia 03/24/2020  . Urinary tract infection without hematuria   . AMS (altered mental status) 03/23/2020  . Hypokalemia 03/23/2020  . Prolonged Q-T interval on ECG 03/23/2020  . DNR (do not resuscitate) 03/23/2020  . MRSA bacteremia 03/23/2020  . Acute combined systolic and diastolic heart failure ([place])   . Elevated troponin   . Dilated cardiomyopathy ([place])   . Coronary artery disease involving native coronary artery of native heart without angina pectoris   . Congestive heart failure (CHF) ([place]) 02/14/2020  . Pyelonephritis 09/10/2019  . Acute blood loss anemia 06/12/2019  . Closed fracture of distal end of right femur, initial encounter ([place]) 06/11/2019  . Closed fracture of lateral portion of right tibial plateau 06/11/2019  . History of DVT of lower extremity 06/11/2019  . Blurry vision 01/06/2019  . Headache 07/24/2016  . Cough 08/24/2015  . Chronic diarrhea 08/24/2015  . Sepsis ([place])   . Encounter for antineoplastic chemotherapy 01/31/2015  . Urinary retention 10/20/2014  . Ileus ([place]) 10/20/2014  . Cellulitis of leg, right 10/20/2014  . Fracture of right proximal fibula   . Ankle fracture 10/02/2014  . Ankle fracture 10/02/2014  . Lung cancer ([place]) 10/02/2014  . Closed fibular fracture 10/02/2014  . Dementia ([place]) 10/02/2014  . Lower urinary tract infectious disease 10/02/2014  . Reactive airway disease 10/02/2014  . Fall 10/02/2014  . Closed left ankle fracture 10/01/2014  . Right fibular fracture 10/01/2014  . Lung metastasis ([place])   . LESION, ANUS 04/26/2009  . DIARRHEA 04/26/2009  . Primary cancer of right upper lobe of lung ([place]) 04/22/2009  . Malignant neoplasm of brain ([place]) 04/22/2009  . VITAMIN D DEFICIENCY 04/22/2009  . ANAL FISTULA 04/22/2009  . OSTEOPOROSIS 04/22/2009   PCP:  [name], MD Pharmacy:   [place], [place] - [address] [place] [address] Phone: [phone]  Fax: [phone]  [place], [place] - [address] [place] [address] Phone: [phone] Fax: [phone]     Social Determinants of Health (SDOH) Interventions    Readmission Risk Interventions Readmission Risk Prevention Plan 02/18/2020  Transportation Screening Complete  PCP or Specialist Appt within 5-7 Days Complete  Home Care Screening Patient refused  Medication Review (RN CM) Complete  Some recent data might be hidden

## 2020-06-04 NOTE — Progress Notes (Signed)
PROGRESS NOTE  Valerie [name]:[id number] DOB: [birth year] DOA: 06/03/2020 PCP: [name], MD   LOS: 1 day   Brief Narrative / Interim history: 74 year old female with history of metastatic non-small cell lung cancer, adenocarcinoma, with brain and neck mets, with history of craniotomy for tumor resection, whole brain radiation, gamma knife, right thyroidectomy with radical neck dissection, currently on Tarceva started in July 5176, systolic CHF, recent MRSA bacteremia, recurrent UTIs including ESBL organisms came into the hospital with left-sided flank and back pain as well as failure to thrive, progressive weakness and immobility.  She has been having poor p.o. intake, nausea, vomiting, diarrhea for the past few weeks.  Subjective / 24h Interval events: Still has diarrhea this morning, but feeling overall a little bit stronger.  Feels like her appetite is better.  Assessment & Plan: Principal Problem Sepsis due to back cellulitis, possible UTI -Patient with large area of cellulitis spanning from her buttocks all the way to the shoulder blades, started on broad-spectrum antibiotics given history of drug-resistant infections, continue. -Monitor cultures -Cellulitic rash seems to be receding and responding to treatment especially on her buttocks. -Husband mentions that she has been dealing with this rash for several months, it appeared and got worse after starting Tarceva, but now it appears infected on top of her chronic rash  Active Problems Hypokalemia -Has a history of hypokalemia, repleted aggressively and actually in the 5 range today.  Monitor  Nausea, vomiting, diarrhea, poor appetite, severe deconditioning, failure to thrive -All possibly due to Tarceva, encourage p.o. intake, PT/OT consultation, sit up in the chair and mobilize early, -Rule out C. difficile given recent prolonged antibiotic course  History of DVT -Continue home Xarelto  Chronic combined CHF,  dilated cardiomyopathy -Most recent EF 25-30% with global hypokinesis, grade 1 diastolic dysfunction -Continu statin, Plavix, metoprolol -Recent cath done in June showed minimal CAD, 50% circumflex disease, it was felt that her cardiomyopathy was likely secondary to [place] which has been discontinued at that time. -Tolerated IV fluids in the ED well, still has diarrhea, will do another bolus today  History of non-small cell lung cancer with mets to the brain and neck -Hold Tarceva per pharmacy in the setting of active infection.  Of note, during her prior hospitalization with MRSA bacteremia it has been continued  Recent MRSA bacteremia -Per prior notes, patient did not want a PICC line and received IV vancomycin while hospitalized for a week and was discharged on linezolid.  Keep on vancomycin and monitor cultures for at least 48 hours.  History of dementia -Mild, alert and oriented x4, continue home medications  Paroxysmal A. Fib -Continue Xarelto, metoprolol, heart rate in the 120s at times but improved after metoprolol  Hyperlipidemia -Continue statin  Normocytic anemia -No bleeding, likely in the setting of chronic illness  Scheduled Meds: . atorvastatin  80 mg Oral QHS  . cholecalciferol  50 mcg Oral Daily  . clopidogrel  75 mg Oral Q breakfast  . donepezil  10 mg Oral QHS  . Gerhardt's butt cream   Topical BID  . magnesium oxide  400 mg Oral Daily  . metoprolol succinate  25 mg Oral Daily  . multivitamin with minerals  1 tablet Oral QPM  . rivaroxaban  20 mg Oral QPM  . saccharomyces boulardii  250 mg Oral BID  . vitamin B-12  1,000 mcg Oral QPM  . vitamin C  250 mg Oral QPM   Continuous Infusions: . meropenem (MERREM) IV Stopped (06/04/20 0032)  .  vancomycin 500 mg (06/04/20 0919)   PRN Meds:.acetaminophen, prochlorperazine  Diet Orders (From admission, onward)    Start     Ordered   06/03/20 1645  Diet regular Room service appropriate? Yes; Fluid  consistency: Thin  Diet effective now       Question Answer Comment  Room service appropriate? Yes   Fluid consistency: Thin      06/03/20 1644          DVT prophylaxis:  rivaroxaban (XARELTO) tablet 20 mg     Code Status: DNR  Family Communication: Called husband Dr. Cathlean Cower [phone]  Status is: Inpatient  Remains inpatient appropriate because:Inpatient level of care appropriate due to severity of illness  Dispo: The patient is from: Home              Anticipated d/c is to: SNF              Anticipated d/c date is: 3 days              Patient currently is not medically stable to d/c.  Consultants:  None   Procedures:  None   Microbiology  Blood cultures 1/4 bottles coag negative staph, staph epi  Antimicrobials: Vancomycin 10/15 >> Meropenem 10/15 >>   Objective: Vitals:   06/03/20 1655 06/03/20 2224 06/04/20 0135 06/04/20 0549  BP: 115/60 102/68 109/67 112/63  Pulse: (!) 102 (!) 107 93 (!) 109  Resp: 17 18 18 18   Temp: 98.1 F (36.7 C) 98.1 F (36.7 C) 98.6 F (37 C) 98.5 F (36.9 C)  TempSrc: Oral Oral Oral Oral  SpO2: 100% 97% 100% 96%  Weight:        Intake/Output Summary (Last 24 hours) at 06/04/2020 1020 Last data filed at 06/04/2020 0857 Gross per 24 hour  Intake 1710.03 ml  Output --  Net 1710.03 ml   Filed Weights   06/03/20 1456  Weight: 59 kg    Examination:  Constitutional: Frail-appearing Eyes: no scleral icterus ENMT: Mucous membranes are dry.  Neck: normal, supple Respiratory: clear to auscultation bilaterally, no wheezing, no crackles.  Cardiovascular: Irregular at times, tachycardic, no significant murmurs appreciated. No edema Abdomen: non distended, no tenderness. Bowel sounds positive.  Musculoskeletal: no clubbing / cyanosis.  Skin: Cellulitic rash on top of chronic rash on her back, margins appear to be receding Neurologic: CN 2-12 grossly intact. Strength 5/5 in all 4.    Data Reviewed: I have  independently reviewed following labs and imaging studies   CBC: Recent Labs  Lab 06/03/20 1022 06/04/20 0557  WBC 13.7* 13.0*  NEUTROABS 11.7*  --   HGB 11.2* 11.3*  HCT 35.4* 34.8*  MCV 96.7 93.3  PLT 434* 786   Basic Metabolic Panel: Recent Labs  Lab 06/03/20 1022 06/04/20 0557  NA 139 137  K 2.4* 5.5*  CL 101 104  CO2 27 22  GLUCOSE 123* 105*  BUN 19 11  CREATININE 0.93 0.75  CALCIUM 9.6 9.1   Liver Function Tests: Recent Labs  Lab 06/03/20 1022 06/04/20 0557  AST 17 37  ALT 15 17  ALKPHOS 112 95  BILITOT 1.0 1.8*  PROT 5.8* 5.1*  ALBUMIN 2.5* 2.0*   Coagulation Profile: Recent Labs  Lab 06/03/20 1022  INR 4.3*   HbA1C: No results for input(s): HGBA1C in the last 72 hours. CBG: No results for input(s): GLUCAP in the last 168 hours.  Recent Results (from the past 240 hour(s))  Respiratory Panel by RT PCR (Flu A&B,  Covid) - Urine, Catheterized     Status: None   Collection Time: 06/03/20 10:23 AM   Specimen: Urine, Catheterized; Nasopharyngeal  Result Value Ref Range Status   SARS Coronavirus 2 by RT PCR NEGATIVE NEGATIVE Final    Comment: (NOTE) SARS-CoV-2 target nucleic acids are NOT DETECTED.  The SARS-CoV-2 RNA is generally detectable in upper respiratoy specimens during the acute phase of infection. The lowest concentration of SARS-CoV-2 viral copies this assay can detect is 131 copies/mL. A negative result does not preclude SARS-Cov-2 infection and should not be used as the sole basis for treatment or other patient management decisions. A negative result may occur with  improper specimen collection/handling, submission of specimen other than nasopharyngeal swab, presence of viral mutation(s) within the areas targeted by this assay, and inadequate number of viral copies (<131 copies/mL). A negative result must be combined with clinical observations, patient history, and epidemiological information. The expected result is Negative.  Fact  Sheet for Patients:  PinkCheek.be  Fact Sheet for Healthcare Providers:  GravelBags.it  This test is no t yet approved or cleared by the [place] FDA and  has been authorized for detection and/or diagnosis of SARS-CoV-2 by FDA under an Emergency Use Authorization (EUA). This EUA will remain  in effect (meaning this test can be used) for the duration of the COVID-19 declaration under Section 564(b)(1) of the Act, 21 U.S.C. section 360bbb-3(b)(1), unless the authorization is terminated or revoked sooner.     Influenza A by PCR NEGATIVE NEGATIVE Final   Influenza B by PCR NEGATIVE NEGATIVE Final    Comment: (NOTE) The Xpert Xpress SARS-CoV-2/FLU/RSV assay is intended as an aid in  the diagnosis of influenza from Nasopharyngeal swab specimens and  should not be used as a sole basis for treatment. Nasal washings and  aspirates are unacceptable for Xpert Xpress SARS-CoV-2/FLU/RSV  testing.  Fact Sheet for Patients: PinkCheek.be  Fact Sheet for Healthcare Providers: GravelBags.it  This test is not yet approved or cleared by the [place] FDA and  has been authorized for detection and/or diagnosis of SARS-CoV-2 by  FDA under an Emergency Use Authorization (EUA). This EUA will remain  in effect (meaning this test can be used) for the duration of the  Covid-19 declaration under Section 564(b)(1) of the Act, 21  U.S.C. section 360bbb-3(b)(1), unless the authorization is  terminated or revoked. Performed at [facility], [place] [address], [place], [place] [address]   Blood Culture (routine x 2)     Status: None (Preliminary result)   Collection Time: 06/03/20 10:30 AM   Specimen: BLOOD LEFT ARM  Result Value Ref Range Status   Specimen Description   Final    BLOOD LEFT ARM Performed at [facility] [address]  [place], [place] [address]    Special Requests   Final    BOTTLES DRAWN AEROBIC AND ANAEROBIC Blood Culture adequate volume Performed at [facility] [address], [place], [place] [address]    Culture  Setup Time   Final    ANAEROBIC BOTTLE ONLY GRAM POSITIVE COCCI CRITICAL RESULT CALLED TO, READ BACK BY AND VERIFIED WITH: PHARMD M HICKS [id number] AT 31 AM BY CM Performed at [facility], [place] [address] [place], [place] [address]    Culture [facility] POSITIVE COCCI  Final   Report Status PENDING  Incomplete  Blood Culture ID Panel (Reflexed)     Status: Abnormal   Collection Time: 06/03/20 10:30 AM  Result Value Ref Range Status   Enterococcus faecalis NOT DETECTED NOT DETECTED Final   Enterococcus Faecium NOT DETECTED NOT DETECTED Final   Listeria monocytogenes NOT DETECTED NOT DETECTED Final   Staphylococcus species DETECTED (A) NOT DETECTED Final    Comment: CRITICAL RESULT CALLED TO, READ BACK BY AND VERIFIED WITH: PHARMD M HICKS [id number] AT 855 BY CM    Staphylococcus aureus (BCID) NOT DETECTED NOT DETECTED Final   Staphylococcus epidermidis DETECTED (A) NOT DETECTED Final    Comment: Methicillin (oxacillin) resistant coagulase negative staphylococcus. Possible blood culture contaminant (unless isolated from more than one blood culture draw or clinical case suggests pathogenicity). No antibiotic treatment is indicated for blood  culture contaminants. CRITICAL RESULT CALLED TO, READ BACK BY AND VERIFIED WITH: PHARMD M HICKS [id number] AT 75 AM BY CM    Staphylococcus lugdunensis NOT DETECTED NOT DETECTED Final   Streptococcus species NOT DETECTED NOT DETECTED Final   Streptococcus agalactiae NOT DETECTED NOT DETECTED Final   Streptococcus pneumoniae NOT DETECTED NOT DETECTED Final   Streptococcus pyogenes NOT DETECTED NOT DETECTED Final   A.calcoaceticus-baumannii NOT DETECTED NOT DETECTED Final   Bacteroides fragilis NOT DETECTED NOT DETECTED Final    Enterobacterales NOT DETECTED NOT DETECTED Final   Enterobacter cloacae complex NOT DETECTED NOT DETECTED Final   Escherichia coli NOT DETECTED NOT DETECTED Final   Klebsiella aerogenes NOT DETECTED NOT DETECTED Final   Klebsiella oxytoca NOT DETECTED NOT DETECTED Final   Klebsiella pneumoniae NOT DETECTED NOT DETECTED Final   Proteus species NOT DETECTED NOT DETECTED Final   Salmonella species NOT DETECTED NOT DETECTED Final   Serratia marcescens NOT DETECTED NOT DETECTED Final   Haemophilus influenzae NOT DETECTED NOT DETECTED Final   Neisseria meningitidis NOT DETECTED NOT DETECTED Final   Pseudomonas aeruginosa NOT DETECTED NOT DETECTED Final   Stenotrophomonas maltophilia NOT DETECTED NOT DETECTED Final   Candida albicans NOT DETECTED NOT DETECTED Final   Candida auris NOT DETECTED NOT DETECTED Final   Candida glabrata NOT DETECTED NOT DETECTED Final   Candida krusei NOT DETECTED NOT DETECTED Final   Candida parapsilosis NOT DETECTED NOT DETECTED Final   Candida tropicalis NOT DETECTED NOT DETECTED Final   Cryptococcus neoformans/gattii NOT DETECTED NOT DETECTED Final   Methicillin resistance mecA/C DETECTED (A) NOT DETECTED Final    Comment: CRITICAL RESULT CALLED TO, READ BACK BY AND VERIFIED WITH: PHARMD M HICKS [id number] AT 855 AM BY CM Performed at [facility], [address] [place], [place] [address]   Blood Culture (routine x 2)     Status: None (Preliminary result)   Collection Time: 06/03/20 10:45 AM   Specimen: BLOOD  Result Value Ref Range Status   Specimen Description   Final    BLOOD LEFT ANTECUBITAL Performed at [facility], [place] [address] [place], [place] [address]    Special Requests   Final    BOTTLES DRAWN AEROBIC AND ANAEROBIC Blood Culture adequate volume Performed at [facility] [address] [place], [place] [address]    Culture   Final    NO GROWTH < 24 HOURS Performed at [facility] [address]  [place], [place] [address]    Report Status PENDING  Incomplete     Radiology Studies: CT Angio Chest PE W/Cm &/Or Wo Cm  Result Date: 06/03/2020 CLINICAL DATA:  High probability of pulmonary embolus. Left-sided abdominal pain. EXAM: CT ANGIOGRAPHY CHEST CT ABDOMEN AND PELVIS WITH CONTRAST TECHNIQUE: Multidetector CT  imaging of the chest was performed using the standard protocol during bolus administration of intravenous contrast. Multiplanar CT image reconstructions and MIPs were obtained to evaluate the vascular anatomy. Multidetector CT imaging of the abdomen and pelvis was performed using the standard protocol during bolus administration of intravenous contrast. CONTRAST:  161mL OMNIPAQUE IOHEXOL 350 MG/ML SOLN COMPARISON:  April 26, 2020. FINDINGS: CTA CHEST FINDINGS Cardiovascular: Satisfactory opacification of the pulmonary arteries to the segmental level. No evidence of pulmonary embolism. Mild cardiomegaly is noted. No pericardial effusion. Atherosclerosis of thoracic aorta is noted without aneurysm formation. Mediastinum/Nodes: No enlarged mediastinal, hilar, or axillary lymph nodes. Thyroid gland, trachea, and esophagus demonstrate no significant findings. Lungs/Pleura: No pneumothorax is noted. Status post left lower lobectomy. Scarring and other postsurgical changes are noted posteriorly. Small left pleural effusion is noted. Musculoskeletal: Postsurgical changes are seen involving the left ribs. Moderate thoracic kyphosis is noted. No acute osseous abnormality is noted. Review of the MIP images confirms the above findings. CT ABDOMEN and PELVIS FINDINGS Hepatobiliary: No focal liver abnormality is seen. No gallstones, gallbladder wall thickening, or biliary dilatation. Pancreas: Unremarkable. No pancreatic ductal dilatation or surrounding inflammatory changes. Spleen: Normal in size without focal abnormality. Adrenals/Urinary Tract: Adrenal glands are unremarkable. Kidneys are normal, without  renal calculi, focal lesion, or hydronephrosis. Bladder is unremarkable. Stomach/Bowel: Stomach is within normal limits. Appendix appears normal. No evidence of bowel wall thickening, distention, or inflammatory changes. Vascular/Lymphatic: Aortic atherosclerosis. No enlarged abdominal or pelvic lymph nodes. Reproductive: At least 2 uterine fibroids are noted. No adnexal abnormality is noted. Other: No abdominal wall hernia or abnormality. No abdominopelvic ascites. Musculoskeletal: No acute or significant osseous findings. Review of the MIP images confirms the above findings. IMPRESSION: 1. No definite evidence of pulmonary embolus. 2. Status post left lower lobectomy with postsurgical changes seen posteriorly. 3. Small left pleural effusion is noted. 4. Aortic atherosclerosis. 5. At least 2 uterine fibroids are noted. 6. No acute abnormality seen in the abdomen or pelvis. Aortic Atherosclerosis (ICD10-I70.0). Electronically Signed   By: Marijo Conception M.D.   On: 06/03/2020 13:43   CT Abdomen Pelvis W Contrast  Result Date: 06/03/2020 CLINICAL DATA:  High probability of pulmonary embolus. Left-sided abdominal pain. EXAM: CT ANGIOGRAPHY CHEST CT ABDOMEN AND PELVIS WITH CONTRAST TECHNIQUE: Multidetector CT imaging of the chest was performed using the standard protocol during bolus administration of intravenous contrast. Multiplanar CT image reconstructions and MIPs were obtained to evaluate the vascular anatomy. Multidetector CT imaging of the abdomen and pelvis was performed using the standard protocol during bolus administration of intravenous contrast. CONTRAST:  175mL OMNIPAQUE IOHEXOL 350 MG/ML SOLN COMPARISON:  April 26, 2020. FINDINGS: CTA CHEST FINDINGS Cardiovascular: Satisfactory opacification of the pulmonary arteries to the segmental level. No evidence of pulmonary embolism. Mild cardiomegaly is noted. No pericardial effusion. Atherosclerosis of thoracic aorta is noted without aneurysm formation.  Mediastinum/Nodes: No enlarged mediastinal, hilar, or axillary lymph nodes. Thyroid gland, trachea, and esophagus demonstrate no significant findings. Lungs/Pleura: No pneumothorax is noted. Status post left lower lobectomy. Scarring and other postsurgical changes are noted posteriorly. Small left pleural effusion is noted. Musculoskeletal: Postsurgical changes are seen involving the left ribs. Moderate thoracic kyphosis is noted. No acute osseous abnormality is noted. Review of the MIP images confirms the above findings. CT ABDOMEN and PELVIS FINDINGS Hepatobiliary: No focal liver abnormality is seen. No gallstones, gallbladder wall thickening, or biliary dilatation. Pancreas: Unremarkable. No pancreatic ductal dilatation or surrounding inflammatory changes. Spleen: Normal in size without focal  abnormality. Adrenals/Urinary Tract: Adrenal glands are unremarkable. Kidneys are normal, without renal calculi, focal lesion, or hydronephrosis. Bladder is unremarkable. Stomach/Bowel: Stomach is within normal limits. Appendix appears normal. No evidence of bowel wall thickening, distention, or inflammatory changes. Vascular/Lymphatic: Aortic atherosclerosis. No enlarged abdominal or pelvic lymph nodes. Reproductive: At least 2 uterine fibroids are noted. No adnexal abnormality is noted. Other: No abdominal wall hernia or abnormality. No abdominopelvic ascites. Musculoskeletal: No acute or significant osseous findings. Review of the MIP images confirms the above findings. IMPRESSION: 1. No definite evidence of pulmonary embolus. 2. Status post left lower lobectomy with postsurgical changes seen posteriorly. 3. Small left pleural effusion is noted. 4. Aortic atherosclerosis. 5. At least 2 uterine fibroids are noted. 6. No acute abnormality seen in the abdomen or pelvis. Aortic Atherosclerosis (ICD10-I70.0). Electronically Signed   By: Marijo Conception M.D.   On: 06/03/2020 13:43   DG Chest Port 1 View  Result Date:  06/03/2020 CLINICAL DATA:  Sepsis. EXAM: PORTABLE CHEST 1 VIEW COMPARISON:  March 22, 2020.  April 26, 2020. FINDINGS: Stable cardiomegaly. No pneumothorax is noted. Right lung is clear. Mild left pleural effusion is noted with associated left basilar atelectasis or infiltrate. Postsurgical changes are noted in the left hilar region. Old left rib fractures are noted. IMPRESSION: Mild left pleural effusion is noted with associated left basilar atelectasis or infiltrate. No pneumothorax is noted. Electronically Signed   By: Marijo Conception M.D.   On: 06/03/2020 11:26   Marzetta Board, MD, PhD [facility]  Between 7 am - 7 pm I am available, please contact me via Amion or Securechat  Between 7 pm - 7 am I am not available, please contact night coverage MD/APP via Amion

## 2020-06-04 NOTE — Progress Notes (Signed)
PHARMACY - PHYSICIAN COMMUNICATION CRITICAL VALUE ALERT - BLOOD CULTURE IDENTIFICATION (BCID)  Valerie [name] is an 74 y.o. female who presented to [facility] on 06/03/2020 with a chief complaint of back and flank pain.  Assessment: 1 of 4 bottles with staph epi, likely contaminant  Name of physician (or Provider) Contacted: Dr. Cruzita Lederer  Current antibiotics: vancomycin and meropenem for sepsis d/t cellulitis on back and possible UTI (hx ESBL UTI and MRSA bacteremia)  Changes to prescribed antibiotics recommended:  Patient is on recommended antibiotics - No changes needed  Results for orders placed or performed during the hospital encounter of 06/03/20  Blood Culture ID Panel (Reflexed) (Collected: 06/03/2020 10:30 AM)  Result Value Ref Range   Enterococcus faecalis NOT DETECTED NOT DETECTED   Enterococcus Faecium NOT DETECTED NOT DETECTED   Listeria monocytogenes NOT DETECTED NOT DETECTED   Staphylococcus species DETECTED (A) NOT DETECTED   Staphylococcus aureus (BCID) NOT DETECTED NOT DETECTED   Staphylococcus epidermidis DETECTED (A) NOT DETECTED   Staphylococcus lugdunensis NOT DETECTED NOT DETECTED   Streptococcus species NOT DETECTED NOT DETECTED   Streptococcus agalactiae NOT DETECTED NOT DETECTED   Streptococcus pneumoniae NOT DETECTED NOT DETECTED   Streptococcus pyogenes NOT DETECTED NOT DETECTED   A.calcoaceticus-baumannii NOT DETECTED NOT DETECTED   Bacteroides fragilis NOT DETECTED NOT DETECTED   Enterobacterales NOT DETECTED NOT DETECTED   Enterobacter cloacae complex NOT DETECTED NOT DETECTED   Escherichia coli NOT DETECTED NOT DETECTED   Klebsiella aerogenes NOT DETECTED NOT DETECTED   Klebsiella oxytoca NOT DETECTED NOT DETECTED   Klebsiella pneumoniae NOT DETECTED NOT DETECTED   Proteus species NOT DETECTED NOT DETECTED   Salmonella species NOT DETECTED NOT DETECTED   Serratia marcescens NOT DETECTED NOT DETECTED   Haemophilus influenzae NOT DETECTED NOT  DETECTED   Neisseria meningitidis NOT DETECTED NOT DETECTED   Pseudomonas aeruginosa NOT DETECTED NOT DETECTED   Stenotrophomonas maltophilia NOT DETECTED NOT DETECTED   Candida albicans NOT DETECTED NOT DETECTED   Candida auris NOT DETECTED NOT DETECTED   Candida glabrata NOT DETECTED NOT DETECTED   Candida krusei NOT DETECTED NOT DETECTED   Candida parapsilosis NOT DETECTED NOT DETECTED   Candida tropicalis NOT DETECTED NOT DETECTED   Cryptococcus neoformans/gattii NOT DETECTED NOT DETECTED   Methicillin resistance mecA/C DETECTED (A) NOT DETECTED    Peggyann Juba, PharmD, BCPS Pharmacy: [phone] 06/04/2020  10:23 AM

## 2020-06-04 NOTE — Evaluation (Signed)
Occupational Therapy Evaluation Patient Details Name: Valerie [name] MRN: [id number] DOB: [birth year] Today's Date: 06/04/2020    History of Present Illness Pt is 74 yo female with hx including metastatic lung CA, adenocarcinoma with brain and neck mets, hx of craniotomy for tumor resection, whole brain radiation, R thyroidectomy with radical neck dissection, CHF, MRSA, and UTIs.  Pt presented to hospital with L sided flank/back pain as well as FTT and progressive weakness. Pt admitted with sepsis due to back cellulitis and possible UTI.   Clinical Impression   PTA, pt was living with her husband and reports she was perform BADLs with assist for bathing from daughters and husband; reports she uses a hoyer lift at home for transfer OOB. Currently requiring Max A for UB ADLs, Total A for LB ADLs, Total A +2 for transfers. Pt presenting with decreased balance, strength, activity tolerance. Pt would benefit from further acute OT to facilitate safe dc. Pending family support, recommend dc to home with [place] for further OT to optimize safety, independence with ADLs, and return to PLOF.     Follow Up Recommendations  Home health OT;Supervision/Assistance - 24 hour (If family feels they can resume support; [facility] aide) ; if family unable to assist, SNF needed.   Equipment Recommendations  None recommended by OT    Recommendations for Other Services PT consult     Precautions / Restrictions Precautions Precautions: Fall Precaution Comments: wounds - entire back excoriated/superficial wounds/cellulitis      Mobility Bed Mobility Overal bed mobility: Needs Assistance Bed Mobility: Supine to Sit     Supine to sit: +2 for physical assistance;Max assist     General bed mobility comments: Pt able to work legs toward EOB slowly, but ultimately required max A of 2 to lift trunk using bed pad and scoot forward  Transfers Overall transfer level: Needs assistance   Transfers: Sit to/from  Stand;Stand Pivot Transfers Sit to Stand: Max assist;+2 physical assistance Stand pivot transfers: +2 physical assistance;Total assist       General transfer comment: Pt was able to stand with max x 2, knees blocked, pad to facilitate.  Required total A x 2 to pivot as pt fatigued    Balance Overall balance assessment: Needs assistance Sitting-balance support: Bilateral upper extremity supported Sitting balance-Leahy Scale: Poor Sitting balance - Comments: kyphotic but looses balance posteriorly - encouraged reaching forward to hands on knees but pt only able to maintain independent balance for short period of time; otherwise requiring min-mod A     Standing balance-Leahy Scale: Zero Standing balance comment: max A x 2 to maintain                           ADL either performed or assessed with clinical judgement   ADL Overall ADL's : Needs assistance/impaired Eating/Feeding: Set up;Sitting   Grooming: Oral care;Supervision/safety;Set up;Sitting Grooming Details (indicate cue type and reason): Pt performign with set up while seated in the reliner.  Upper Body Bathing: Maximal assistance;Sitting   Lower Body Bathing: Total assistance;+2 for physical assistance;+2 for safety/equipment;Sit to/from stand   Upper Body Dressing : Maximal assistance;Sitting   Lower Body Dressing: Total assistance;+2 for physical assistance;+2 for safety/equipment;Sit to/from stand   Toilet Transfer: Total assistance;+2 for physical assistance;+2 for safety/equipment;Stand-pivot (simulated to recliner)           Functional mobility during ADLs: Total assistance;+2 for physical assistance;+2 for safety/equipment General ADL Comments: Pt presenting with decreased balance, strength,  and activity [place]. Feel pt is weaker than baseline but not far from her baseline./     Vision         Perception     Praxis      Pertinent Vitals/Pain Pain Assessment: Faces Faces Pain Scale:  Hurts even more Pain Location: Back-wounds Pain Descriptors / Indicators: Grimacing Pain Intervention(s): Limited activity within patient's tolerance;Monitored during session;Repositioned     Hand Dominance Right   Extremity/Trunk Assessment Upper Extremity Assessment Upper Extremity Assessment: Generalized weakness   Lower Extremity Assessment Lower Extremity Assessment: Defer to PT evaluation RLE Deficits / Details: ROM WFL; Unable to tolerate or follow MMT - demonstrating grossly 2/5 throughout LLE Deficits / Details: ROM WFL; Unable to tolerate or follow MMT commands - demonstrating grossly 3/5 throughout   Cervical / Trunk Assessment Cervical / Trunk Assessment: Kyphotic;Other exceptions Cervical / Trunk Exceptions: Extremely kyphotic with forward head   Communication Communication Communication: No difficulties   Cognition Arousal/Alertness: Awake/alert Behavior During Therapy: Flat affect Overall Cognitive Status: No family/caregiver present to determine baseline cognitive functioning                                 General Comments: Pt questionable historian and oriented to self only.  Followed simple commands with increased time. Following simple commands. Fatigues quickly. Sweet and agreeable to therapy   General Comments  Pt with poor skin integrity on back (excoriated superficial wounds) -due to this utilized bed pad to facilitate transfers and gait belt not used.  Additionally, when postionined in chair - had hoyer pad in place but removed plastic stays and covered with bed pad.    Exercises     Shoulder Instructions      Home Living Family/patient expects to be discharged to:: Private residence Living Arrangements: Spouse/significant other Available Help at Discharge: Available 24 hours/day (possible HH aides?) Type of Home: House Home Access: [place]: Able to live on main level with bedroom/bathroom;Two level;Full bath  on main level     Bathroom Shower/Tub: Occupational psychologist: Standard     Home Equipment: Other (comment);Shower seat;Walker - 2 wheels;Wheelchair - Scientist, product/process development - 4 wheels   Additional Comments: toilet riser; hoyer; bed pan      Prior Functioning/Environment Level of Independence: Needs assistance  Gait / Transfers Assistance Needed: non-ambulatory; reports was working with therapy on standing but uses hoyer with family ADL's / Homemaking Assistance Needed: Has assist with ADLs-does bed baths during the week, on weekends reports children help to get in shower   Comments: Pt is questionable historian.  May have personal care aides or just [facility] therapy - pt unable to clarify and family not present        OT Problem List: Decreased strength;Decreased range of motion;Decreased activity tolerance;Impaired balance (sitting and/or standing);Decreased safety awareness;Decreased knowledge of use of DME or AE;Decreased knowledge of precautions;Pain      OT Treatment/Interventions: Self-care/ADL training;Therapeutic exercise;Energy conservation;DME and/or AE instruction;Therapeutic activities;Patient/family education    OT Goals(Current goals can be found in the care plan section) Acute Rehab OT Goals Patient Stated Goal: not stated OT Goal Formulation: With patient Time For Goal Achievement: 06/18/20 Potential to Achieve Goals: Good  OT Frequency: Min 2X/week   Barriers to D/C:            Co-evaluation PT/OT/SLP Co-Evaluation/Treatment: Yes Reason for Co-Treatment: For patient/therapist safety;To address functional/ADL transfers  PT goals addressed during session: Mobility/safety with mobility OT goals addressed during session: ADL's and self-care      AM-PAC OT "6 Clicks" Daily Activity     Outcome Measure Help from another person eating meals?: A Little Help from another person taking care of personal grooming?: A Little Help from another person  toileting, which includes using toliet, bedpan, or urinal?: Total Help from another person bathing (including washing, rinsing, drying)?: A Lot Help from another person to put on and taking off regular upper body clothing?: A Lot Help from another person to put on and taking off regular lower body clothing?: Total 6 Click Score: 12   End of Session Nurse Communication: Mobility status;Need for lift equipment  Activity Tolerance: Patient limited by fatigue Patient left: in chair;with call bell/phone within reach  OT Visit Diagnosis: Unsteadiness on feet (R26.81);Other abnormalities of gait and mobility (R26.89);Muscle weakness (generalized) (M62.81);Pain Pain - part of body:  (Back)                Time: 7619-5093 OT Time Calculation (min): 27 min Charges:  OT General Charges $OT Visit: 1 Visit OT Evaluation $OT Eval Moderate Complexity: [place], OTR/L Acute Rehab Pager: [phone] Office: [place] 06/04/2020, 2:08 PM

## 2020-06-05 DIAGNOSIS — A419 Sepsis, unspecified organism: Secondary | ICD-10-CM | POA: Diagnosis not present

## 2020-06-05 DIAGNOSIS — I5042 Chronic combined systolic (congestive) and diastolic (congestive) heart failure: Secondary | ICD-10-CM | POA: Diagnosis not present

## 2020-06-05 DIAGNOSIS — L03312 Cellulitis of back [any part except buttock]: Secondary | ICD-10-CM | POA: Diagnosis not present

## 2020-06-05 DIAGNOSIS — C799 Secondary malignant neoplasm of unspecified site: Secondary | ICD-10-CM | POA: Diagnosis not present

## 2020-06-05 LAB — CBC
HCT: 31 % — ABNORMAL LOW (ref 36.0–46.0)
Hemoglobin: 9.6 g/dL — ABNORMAL LOW (ref 12.0–15.0)
MCH: 30.4 pg (ref 26.0–34.0)
MCHC: 31 g/dL (ref 30.0–36.0)
MCV: 98.1 fL (ref 80.0–100.0)
Platelets: 354 10*3/uL (ref 150–400)
RBC: 3.16 MIL/uL — ABNORMAL LOW (ref 3.87–5.11)
RDW: 17.3 % — ABNORMAL HIGH (ref 11.5–15.5)
WBC: 12.6 10*3/uL — ABNORMAL HIGH (ref 4.0–10.5)
nRBC: 0 % (ref 0.0–0.2)

## 2020-06-05 LAB — COMPREHENSIVE METABOLIC PANEL
ALT: 12 U/L (ref 0–44)
AST: 13 U/L — ABNORMAL LOW (ref 15–41)
Albumin: 1.7 g/dL — ABNORMAL LOW (ref 3.5–5.0)
Alkaline Phosphatase: 78 U/L (ref 38–126)
Anion gap: 6 (ref 5–15)
BUN: 12 mg/dL (ref 8–23)
CO2: 24 mmol/L (ref 22–32)
Calcium: 8.4 mg/dL — ABNORMAL LOW (ref 8.9–10.3)
Chloride: 104 mmol/L (ref 98–111)
Creatinine, Ser: 0.64 mg/dL (ref 0.44–1.00)
GFR, Estimated: >60 mL/min (ref >60)
Glucose, Bld: 106 mg/dL — ABNORMAL HIGH (ref 70–99)
Potassium: 3 mmol/L — ABNORMAL LOW (ref 3.5–5.1)
Sodium: 134 mmol/L — ABNORMAL LOW (ref 135–145)
Total Bilirubin: 0.6 mg/dL (ref 0.3–1.2)
Total Protein: 4.4 g/dL — ABNORMAL LOW (ref 6.5–8.1)

## 2020-06-05 LAB — MAGNESIUM: Magnesium: 1.9 mg/dL (ref 1.7–2.4)

## 2020-06-05 LAB — POTASSIUM: Potassium: 3.5 mmol/L (ref 3.5–5.1)

## 2020-06-05 MED ORDER — OXYCODONE-ACETAMINOPHEN 5-325 MG PO TABS
1.0000 | ORAL_TABLET | Freq: Four times a day (QID) | ORAL | Status: DC | PRN
  Administered 2020-06-06 – 2020-06-07 (×5): 1 via ORAL
  Filled 2020-06-05 (×5): qty 1

## 2020-06-05 MED ORDER — OXYCODONE-ACETAMINOPHEN 5-325 MG PO TABS
1.0000 | ORAL_TABLET | Freq: Once | ORAL | Status: AC
  Administered 2020-06-05: 1 via ORAL
  Filled 2020-06-05: qty 1

## 2020-06-05 MED ORDER — ZINC OXIDE 40 % EX OINT
TOPICAL_OINTMENT | Freq: Three times a day (TID) | CUTANEOUS | Status: DC | PRN
  Filled 2020-06-05 (×3): qty 57

## 2020-06-05 MED ORDER — SODIUM CHLORIDE 0.9 % IV SOLN
1.0000 g | Freq: Three times a day (TID) | INTRAVENOUS | Status: DC
  Administered 2020-06-05 – 2020-06-07 (×5): 1 g via INTRAVENOUS
  Filled 2020-06-05 (×6): qty 1

## 2020-06-05 MED ORDER — POTASSIUM CHLORIDE 20 MEQ/15ML (10%) PO SOLN
40.0000 meq | Freq: Once | ORAL | Status: AC
  Administered 2020-06-05: 40 meq via ORAL
  Filled 2020-06-05: qty 30

## 2020-06-05 MED ORDER — MORPHINE SULFATE (PF) 2 MG/ML IV SOLN
2.0000 mg | Freq: Once | INTRAVENOUS | Status: AC
  Administered 2020-06-05: 2 mg via INTRAVENOUS
  Filled 2020-06-05: qty 1

## 2020-06-05 NOTE — Progress Notes (Signed)
PROGRESS NOTE  Valerie [name] SNK:[id number] DOB: Dec 01, 1945 DOA: 06/03/2020 PCP: [name], MD   LOS: 2 days   Brief Narrative / Interim history: 74 year old female with history of metastatic non-small cell lung cancer, adenocarcinoma, with brain and neck mets, with history of craniotomy for tumor resection, whole brain radiation, gamma knife, right thyroidectomy with radical neck dissection, currently on Tarceva started in July 9379, systolic CHF, recent MRSA bacteremia, recurrent UTIs including ESBL organisms came into the hospital with left-sided flank and back pain as well as failure to thrive, progressive weakness and immobility.  She has been having poor p.o. intake, nausea, vomiting, diarrhea for the past few weeks.  Subjective / 24h Interval events: Complains of back pain, wants a shot with pain medication to help with it.  Still has diarrhea.  Appetite is improving.  Assessment & Plan: Principal Problem Sepsis due to back cellulitis, possible UTI -Patient with large area of cellulitis spanning from her buttocks all the way to the shoulder blades, started on broad-spectrum antibiotics given history of drug-resistant infections, continue antibiotics today, cellulitic rash improving. -Monitor cultures -Husband mentions that she has been dealing with this rash for several months, it appeared and got worse after starting Tarceva, but now it appears infected on top of her chronic rash  Active Problems Hypokalemia -Continue to replete, 3.0 today  Nausea, vomiting, diarrhea, poor appetite, severe deconditioning, failure to thrive -All possibly due to Tarceva, encourage p.o. intake, PT/OT consultation, sit up in the chair and mobilize early, -Rule out C. difficile given recent prolonged antibiotic course, sent down twice however lab refused to run the sample because it was "too formed"  History of DVT -Continue home Xarelto  Chronic combined CHF, dilated cardiomyopathy  -Most recent EF 25-30% with global hypokinesis, grade 1 diastolic dysfunction -Continu statin, Plavix, metoprolol -Recent cath done in June showed minimal CAD, 50% circumflex disease, it was felt that her cardiomyopathy was likely secondary to [place] which has been discontinued at that time. -Appears euvolemic, received IV fluids in the ED and again 10/16.  Avoid further IV fluids  History of non-small cell lung cancer with mets to the brain and neck -Hold Tarceva per pharmacy in the setting of active infection.  Of note, during her prior hospitalization with MRSA bacteremia it has been continued. -I wonder whether Tarceva is playing a major role in her rash, poor immune system, failure to thrive, poor appetite, diarrhea, nausea.  Patient's daughter is inclining towards stopping it.  Will discuss with Dr. Julien Nordmann tomorrow  Recent MRSA bacteremia -Per prior notes, patient did not want a PICC line and received IV vancomycin while hospitalized for a week and was discharged on linezolid.    Continue current antibiotics  History of dementia -Mild, alert and oriented x4, continue home medications  Paroxysmal A. Fib -Continue Xarelto, metoprolol  Hyperlipidemia -Continue statin  Normocytic anemia -No bleeding, likely in the setting of chronic illness  Scheduled Meds: . atorvastatin  80 mg Oral QHS  . cholecalciferol  50 mcg Oral Daily  . clopidogrel  75 mg Oral Q breakfast  . donepezil  10 mg Oral QHS  . feeding supplement  1 Container Oral BID BM  . Gerhardt's butt cream   Topical BID  . magnesium oxide  400 mg Oral Daily  . metoprolol succinate  25 mg Oral Daily  .  morphine injection  2 mg Intravenous Once  . multivitamin with minerals  1 tablet Oral QPM  . protein supplement  1 packet  Oral q12n4p  . rivaroxaban  20 mg Oral QPM  . saccharomyces boulardii  250 mg Oral BID  . vitamin B-12  1,000 mcg Oral QPM  . vitamin C  250 mg Oral QPM   Continuous Infusions: .  meropenem (MERREM) IV 1 g (06/04/20 2335)  . vancomycin 500 mg (06/05/20 0936)   PRN Meds:.acetaminophen, liver oil-zinc oxide, oxyCODONE-acetaminophen, prochlorperazine  Diet Orders (From admission, onward)    Start     Ordered   06/03/20 1645  Diet regular Room service appropriate? Yes; Fluid consistency: Thin  Diet effective now       Question Answer Comment  Room service appropriate? Yes   Fluid consistency: Thin      06/03/20 1644          DVT prophylaxis:  rivaroxaban (XARELTO) tablet 20 mg     Code Status: DNR  Family Communication: Called husband Dr. Cathlean Cower [phone]  Status is: Inpatient  Remains inpatient appropriate because:Inpatient level of care appropriate due to severity of illness  Dispo: The patient is from: Home              Anticipated d/c is to: SNF              Anticipated d/c date is: 3 days              Patient currently is not medically stable to d/c.  Consultants:  None   Procedures:  None   Microbiology  Blood cultures 1/4 bottles coag negative staph, staph epi  Antimicrobials: Vancomycin 10/15 >> Meropenem 10/15 >>   Objective: Vitals:   06/04/20 0549 06/04/20 1331 06/04/20 2127 06/05/20 0552  BP: 112/63 (!) 105/57 (!) 101/53 106/64  Pulse: (!) 109 100 88 85  Resp: 18 17 18 18   Temp: 98.5 F (36.9 C) (!) 97.3 F (36.3 C) 98 F (36.7 C) 97.7 F (36.5 C)  TempSrc: Oral Oral  Oral  SpO2: 96% 96% 91% 98%  Weight:        Intake/Output Summary (Last 24 hours) at 06/05/2020 1015 Last data filed at 06/05/2020 0600 Gross per 24 hour  Intake 1600 ml  Output 350 ml  Net 1250 ml   Filed Weights   06/03/20 1456  Weight: 59 kg    Examination:  Constitutional: Frail-appearing, in bed Eyes: No scleral icterus ENMT: Moist mucous membranes Neck: normal, supple Respiratory: Diminished at the bases but overall clear without wheezing Cardiovascular: Regular, no murmurs, no edema Abdomen: Soft, nontender,  nondistended, bowel sounds positive Musculoskeletal: no clubbing / cyanosis.  Skin: Cellulitic rash on top of chronic rash on her back, margins appear better Neurologic: Nonfocal, tremor present    Data Reviewed: I have independently reviewed following labs and imaging studies   CBC: Recent Labs  Lab 06/03/20 1022 06/04/20 0557 06/05/20 0500  WBC 13.7* 13.0* 12.6*  NEUTROABS 11.7*  --   --   HGB 11.2* 11.3* 9.6*  HCT 35.4* 34.8* 31.0*  MCV 96.7 93.3 98.1  PLT 434* 362 638   Basic Metabolic Panel: Recent Labs  Lab 06/03/20 1022 06/04/20 0557 06/05/20 0500  NA 139 137 134*  K 2.4* 5.5* 3.0*  CL 101 104 104  CO2 27 22 24   GLUCOSE 123* 105* 106*  BUN 19 11 12   CREATININE 0.93 0.75 0.64  CALCIUM 9.6 9.1 8.4*   Liver Function Tests: Recent Labs  Lab 06/03/20 1022 06/04/20 0557 06/05/20 0500  AST 17 37 13*  ALT 15 17 12   ALKPHOS 112  95 78  BILITOT 1.0 1.8* 0.6  PROT 5.8* 5.1* 4.4*  ALBUMIN 2.5* 2.0* 1.7*   Coagulation Profile: Recent Labs  Lab 06/03/20 1022  INR 4.3*   HbA1C: No results for input(s): HGBA1C in the last 72 hours. CBG: No results for input(s): GLUCAP in the last 168 hours.  Recent Results (from the past 240 hour(s))  Urine culture     Status: Abnormal (Preliminary result)   Collection Time: 06/03/20 10:23 AM   Specimen: In/Out Cath Urine  Result Value Ref Range Status   Specimen Description IN/OUT CATH URINE  Final   Special Requests NONE  Final   Culture (A)  Final    >=100,000 COLONIES/mL ESCHERICHIA COLI SUSCEPTIBILITIES TO FOLLOW    Report Status PENDING  Incomplete   Organism ID, Bacteria ESCHERICHIA COLI (A)  Final      Susceptibility   Escherichia coli - MIC*    AMPICILLIN 4 SENSITIVE Sensitive     CEFAZOLIN <=4 SENSITIVE Sensitive     CEFTRIAXONE <=0.25 SENSITIVE Sensitive     CIPROFLOXACIN >=4 RESISTANT Resistant     GENTAMICIN >=16 RESISTANT Resistant     IMIPENEM <=0.25 SENSITIVE Sensitive     NITROFURANTOIN <=16  SENSITIVE Sensitive     TRIMETH/SULFA <=20 SENSITIVE Sensitive     AMPICILLIN/SULBACTAM <=2 SENSITIVE Sensitive     PIP/TAZO Value in next row Sensitive      <=4 SENSITIVEPerformed at [facility], [address], [place], [place] [address]    * >=100,000 COLONIES/mL ESCHERICHIA COLI  Respiratory Panel by RT PCR (Flu A&B, Covid) - Urine, Catheterized     Status: None   Collection Time: 06/03/20 10:23 AM   Specimen: Urine, Catheterized; Nasopharyngeal  Result Value Ref Range Status   SARS Coronavirus 2 by RT PCR NEGATIVE NEGATIVE Final    Comment: (NOTE) SARS-CoV-2 target nucleic acids are NOT DETECTED.  The SARS-CoV-2 RNA is generally detectable in upper respiratoy specimens during the acute phase of infection. The lowest concentration of SARS-CoV-2 viral copies this assay can detect is 131 copies/mL. A negative result does not preclude SARS-Cov-2 infection and should not be used as the sole basis for treatment or other patient management decisions. A negative result may occur with  improper specimen collection/handling, submission of specimen other than nasopharyngeal swab, presence of viral mutation(s) within the areas targeted by this assay, and inadequate number of viral copies (<131 copies/mL). A negative result must be combined with clinical observations, patient history, and epidemiological information. The expected result is Negative.  Fact Sheet for Patients:  PinkCheek.be  Fact Sheet for Healthcare Providers:  GravelBags.it  This test is no t yet approved or cleared by the [place] FDA and  has been authorized for detection and/or diagnosis of SARS-CoV-2 by FDA under an Emergency Use Authorization (EUA). This EUA will remain  in effect (meaning this test can be used) for the duration of the COVID-19 declaration under Section 564(b)(1) of the Act, 21 U.S.C. section 360bbb-3(b)(1), unless the  authorization is terminated or revoked sooner.     Influenza A by PCR NEGATIVE NEGATIVE Final   Influenza B by PCR NEGATIVE NEGATIVE Final    Comment: (NOTE) The Xpert Xpress SARS-CoV-2/FLU/RSV assay is intended as an aid in  the diagnosis of influenza from Nasopharyngeal swab specimens and  should not be used as a sole basis for treatment. Nasal washings and  aspirates are unacceptable for Xpert Xpress SARS-CoV-2/FLU/RSV  testing.  Fact Sheet for Patients: PinkCheek.be  Fact Sheet  for Healthcare Providers: GravelBags.it  This test is not yet approved or cleared by the [place] and  has been authorized for detection and/or diagnosis of SARS-CoV-2 by  FDA under an Emergency Use Authorization (EUA). This EUA will remain  in effect (meaning this test can be used) for the duration of the  Covid-19 declaration under Section 564(b)(1) of the Act, 21  U.S.C. section 360bbb-3(b)(1), unless the authorization is  terminated or revoked. Performed at [facility], [place] [address], [place], [place] [address]   Blood Culture (routine x 2)     Status: Abnormal (Preliminary result)   Collection Time: 06/03/20 10:30 AM   Specimen: BLOOD LEFT ARM  Result Value Ref Range Status   Specimen Description   Final    BLOOD LEFT ARM Performed at [place] [address] [place], [place] [address]    Special Requests   Final    BOTTLES DRAWN AEROBIC AND ANAEROBIC Blood Culture adequate volume Performed at [place] [facility] [address] [place], [place] [address]    Culture  Setup Time   Final    ANAEROBIC BOTTLE ONLY GRAM POSITIVE COCCI CRITICAL RESULT CALLED TO, READ BACK BY AND VERIFIED WITH: PHARMD M HICKS [id number] AT 30 AM BY CM    Culture (A)  Final    STAPHYLOCOCCUS EPIDERMIDIS THE SIGNIFICANCE OF ISOLATING THIS ORGANISM FROM A SINGLE SET OF BLOOD CULTURES WHEN  MULTIPLE SETS ARE DRAWN IS UNCERTAIN. PLEASE NOTIFY THE MICROBIOLOGY DEPARTMENT WITHIN ONE WEEK IF SPECIATION AND SENSITIVITIES ARE REQUIRED. Performed at [facility], [place] [address] [place], [place] [address]    Report Status PENDING  Incomplete  Blood Culture ID Panel (Reflexed)     Status: Abnormal   Collection Time: 06/03/20 10:30 AM  Result Value Ref Range Status   Enterococcus faecalis NOT DETECTED NOT DETECTED Final   Enterococcus Faecium NOT DETECTED NOT DETECTED Final   Listeria monocytogenes NOT DETECTED NOT DETECTED Final   Staphylococcus species DETECTED (A) NOT DETECTED Final    Comment: CRITICAL RESULT CALLED TO, READ BACK BY AND VERIFIED WITH: PHARMD M HICKS [id number] AT 855 BY CM    Staphylococcus aureus (BCID) NOT DETECTED NOT DETECTED Final   Staphylococcus epidermidis DETECTED (A) NOT DETECTED Final    Comment: Methicillin (oxacillin) resistant coagulase negative staphylococcus. Possible blood culture contaminant (unless isolated from more than one blood culture draw or clinical case suggests pathogenicity). No antibiotic treatment is indicated for blood  culture contaminants. CRITICAL RESULT CALLED TO, READ BACK BY AND VERIFIED WITH: PHARMD M HICKS [id number] AT 90 AM BY CM    Staphylococcus lugdunensis NOT DETECTED NOT DETECTED Final   Streptococcus species NOT DETECTED NOT DETECTED Final   Streptococcus agalactiae NOT DETECTED NOT DETECTED Final   Streptococcus pneumoniae NOT DETECTED NOT DETECTED Final   Streptococcus pyogenes NOT DETECTED NOT DETECTED Final   A.calcoaceticus-baumannii NOT DETECTED NOT DETECTED Final   Bacteroides fragilis NOT DETECTED NOT DETECTED Final   Enterobacterales NOT DETECTED NOT DETECTED Final   Enterobacter cloacae complex NOT DETECTED NOT DETECTED Final   Escherichia coli NOT DETECTED NOT DETECTED Final   Klebsiella aerogenes NOT DETECTED NOT DETECTED Final   Klebsiella oxytoca NOT DETECTED NOT DETECTED Final   Klebsiella  pneumoniae NOT DETECTED NOT DETECTED Final   Proteus species NOT DETECTED NOT DETECTED Final   Salmonella species NOT DETECTED NOT DETECTED Final   Serratia marcescens NOT DETECTED NOT DETECTED Final   Haemophilus influenzae NOT  DETECTED NOT DETECTED Final   Neisseria meningitidis NOT DETECTED NOT DETECTED Final   Pseudomonas aeruginosa NOT DETECTED NOT DETECTED Final   Stenotrophomonas maltophilia NOT DETECTED NOT DETECTED Final   Candida albicans NOT DETECTED NOT DETECTED Final   Candida auris NOT DETECTED NOT DETECTED Final   Candida glabrata NOT DETECTED NOT DETECTED Final   Candida krusei NOT DETECTED NOT DETECTED Final   Candida parapsilosis NOT DETECTED NOT DETECTED Final   Candida tropicalis NOT DETECTED NOT DETECTED Final   Cryptococcus neoformans/gattii NOT DETECTED NOT DETECTED Final   Methicillin resistance mecA/C DETECTED (A) NOT DETECTED Final    Comment: CRITICAL RESULT CALLED TO, READ BACK BY AND VERIFIED WITH: PHARMD M HICKS [id number] AT 855 AM BY CM Performed at [facility], [place] [address] [place], [place] [address]   Blood Culture (routine x 2)     Status: None (Preliminary result)   Collection Time: 06/03/20 10:45 AM   Specimen: BLOOD  Result Value Ref Range Status   Specimen Description   Final    BLOOD LEFT ANTECUBITAL Performed at [facility], [place] [address] [place], [place] [address]    Special Requests   Final    BOTTLES DRAWN AEROBIC AND ANAEROBIC Blood Culture adequate volume Performed at [place] [address], [place], [place] [address]    Culture   Final    NO GROWTH 2 DAYS Performed at [facility] [address], [place], [place] [address]    Report Status PENDING  Incomplete     Radiology Studies: No results found. Marzetta Board, MD, PhD [facility]  Between 7 am - 7 pm I am available, please contact me via Amion or Securechat  Between 7 pm - 7 am I am not available, please contact  night coverage MD/APP via Amion

## 2020-06-05 NOTE — Progress Notes (Signed)
Pharmacy Antibiotic Note  Valerie [name] is a 74 y.o. female admitted on 06/03/2020 with cellulitis.  Pharmacy has been consulted for vancomycin and meropenem cellulitis dosing.  Plan: Continue vancomycin 500 mg IV q12h  Increase meropenem 1000 mg IV q8h  Monitor clinical course, renal function, cultures as available  Weight: 59 kg (130 lb)  Temp (24hrs), Avg:97.7 F (36.5 C), Min:97.3 F (36.3 C), Max:98 F (36.7 C)  Recent Labs  Lab 06/03/20 1022 06/03/20 1023 06/03/20 1220 06/03/20 1523 06/04/20 0557 06/05/20 0500  WBC 13.7*  --   --   --  13.0* 12.6*  CREATININE 0.93  --   --   --  0.75 0.64  LATICACIDVEN  --  2.2* 2.2* 2.2*  --   --     Estimated Creatinine Clearance: 57.5 mL/min (by C-G formula based on SCr of 0.64 mg/dL).    Allergies  Allergen Reactions  . Keflex [Cephalexin] Itching and Rash    Throat swelling- Patient has tolerated Rocephin & Cefdinir since this reaction    Antimicrobials this admission: 10/15 meropenem >>  10/15 vancomycin >>   Dose adjustments this admission: 10/17 increase meropenem from 1g q12h to q8h for CrCl > 50  Microbiology results: 10/15 BCx: 1 of 4 staph epi 10/15 UCx: >100K Ecoli (reincubating) 10/15 Resp panel:  Negative   Thank you for allowing pharmacy to be a part of this patient's care.  Peggyann Juba, PharmD, BCPS Pharmacy: [phone] 06/05/2020 12:41 PM

## 2020-06-06 DIAGNOSIS — I5042 Chronic combined systolic (congestive) and diastolic (congestive) heart failure: Secondary | ICD-10-CM | POA: Diagnosis not present

## 2020-06-06 DIAGNOSIS — L03312 Cellulitis of back [any part except buttock]: Secondary | ICD-10-CM | POA: Diagnosis not present

## 2020-06-06 DIAGNOSIS — A419 Sepsis, unspecified organism: Secondary | ICD-10-CM | POA: Diagnosis not present

## 2020-06-06 DIAGNOSIS — C799 Secondary malignant neoplasm of unspecified site: Secondary | ICD-10-CM | POA: Diagnosis not present

## 2020-06-06 LAB — URINE CULTURE: Culture: >=100000 — AB

## 2020-06-06 LAB — CBC
HCT: 29.8 % — ABNORMAL LOW (ref 36.0–46.0)
Hemoglobin: 9.7 g/dL — ABNORMAL LOW (ref 12.0–15.0)
MCH: 30.7 pg (ref 26.0–34.0)
MCHC: 32.6 g/dL (ref 30.0–36.0)
MCV: 94.3 fL (ref 80.0–100.0)
Platelets: 329 10*3/uL (ref 150–400)
RBC: 3.16 MIL/uL — ABNORMAL LOW (ref 3.87–5.11)
RDW: 17.2 % — ABNORMAL HIGH (ref 11.5–15.5)
WBC: 16.8 10*3/uL — ABNORMAL HIGH (ref 4.0–10.5)
nRBC: 0 % (ref 0.0–0.2)

## 2020-06-06 LAB — CULTURE, BLOOD (ROUTINE X 2): Special Requests: ADEQUATE

## 2020-06-06 LAB — BASIC METABOLIC PANEL
Anion gap: 8 (ref 5–15)
BUN: 10 mg/dL (ref 8–23)
CO2: 24 mmol/L (ref 22–32)
Calcium: 8.4 mg/dL — ABNORMAL LOW (ref 8.9–10.3)
Chloride: 103 mmol/L (ref 98–111)
Creatinine, Ser: 0.65 mg/dL (ref 0.44–1.00)
GFR, Estimated: >60 mL/min (ref >60)
Glucose, Bld: 111 mg/dL — ABNORMAL HIGH (ref 70–99)
Potassium: 3.7 mmol/L (ref 3.5–5.1)
Sodium: 135 mmol/L (ref 135–145)

## 2020-06-06 LAB — MAGNESIUM: Magnesium: 1.9 mg/dL (ref 1.7–2.4)

## 2020-06-06 LAB — PHOSPHORUS: Phosphorus: 1.1 mg/dL — ABNORMAL LOW (ref 2.5–4.6)

## 2020-06-06 MED ORDER — SILVER SULFADIAZINE 1 % EX CREA
TOPICAL_CREAM | Freq: Every day | CUTANEOUS | Status: DC
  Filled 2020-06-06: qty 50

## 2020-06-06 MED ORDER — SODIUM PHOSPHATES 45 MMOLE/15ML IV SOLN
15.0000 mmol | Freq: Once | INTRAVENOUS | Status: AC
  Administered 2020-06-06: 15 mmol via INTRAVENOUS
  Filled 2020-06-06: qty 5

## 2020-06-06 MED ORDER — POTASSIUM CHLORIDE 20 MEQ PO PACK
30.0000 meq | PACK | Freq: Once | ORAL | Status: AC
  Administered 2020-06-06: 30 meq via ORAL
  Filled 2020-06-06: qty 2

## 2020-06-06 MED ORDER — POTASSIUM CHLORIDE CRYS ER 20 MEQ PO TBCR
30.0000 meq | EXTENDED_RELEASE_TABLET | Freq: Once | ORAL | Status: DC
  Filled 2020-06-06: qty 1

## 2020-06-06 NOTE — TOC Progression Note (Signed)
Transition of Care [facility]) - Progression Note    Patient Details  Name: Valerie [name] MRN: [id number] Date of Birth: July 28, 1946  Transition of [facility]) CM/SW Contact  Purcell Mouton, RN Phone Number: 06/06/2020, 12:17 PM  Clinical Narrative:    [place] SNF declined pt.    Expected Discharge Plan:  (SNF v Home) Barriers to Discharge: SNF Pending bed offer  Expected Discharge Plan and Services Expected Discharge Plan:  (SNF v Home)   Discharge Planning Services: CM Consult Post Acute Care Choice: [place] Living arrangements for the past 2 months: Single Family Home                                       Social Determinants of Health (SDOH) Interventions    Readmission Risk Interventions Readmission Risk Prevention Plan 02/18/2020  Transportation Screening Complete  PCP or Specialist Appt within 5-7 Days Complete  Home Care Screening Patient refused  Medication Review (RN CM) Complete  Some recent data might be hidden

## 2020-06-06 NOTE — Care Management Important Message (Signed)
Important Message  Patient Details IM Letter given to the Patient Name: Valerie [name] MRN: [id number] Date of Birth: 12-15-1945   Medicare Important Message Given:  Yes     Kerin Salen 06/06/2020, 10:42 AM

## 2020-06-06 NOTE — Progress Notes (Addendum)
PROGRESS NOTE  Valerie [name] VQQ:[id number] DOB: 1946/01/01 DOA: 06/03/2020 PCP: [name], MD   LOS: 3 days   Brief Narrative / Interim history: 74 year old female with history of metastatic non-small cell lung cancer, adenocarcinoma, with brain and neck mets, with history of craniotomy for tumor resection, whole brain radiation, gamma knife, right thyroidectomy with radical neck dissection, currently on Tarceva started in July 4332, systolic CHF, recent MRSA bacteremia, recurrent UTIs including ESBL organisms came into the hospital with left-sided flank and back pain as well as failure to thrive, progressive weakness and immobility.  She has been having poor p.o. intake, nausea, vomiting, diarrhea for the past few weeks.  Subjective / 24h Interval events: She tells me that her back pain has been improving.  She feels a bit stronger.  Assessment & Plan: Principal Problem Sepsis due to back cellulitis, possible UTI -Patient with large area of cellulitis spanning from her buttocks all the way to the shoulder blades, started on broad-spectrum antibiotics given history of drug-resistant infections, continue intravenous antibiotics, WBC slightly up today -Husband mentions that she has been dealing with this rash for several months, but this looks slightly different and more cellulitic, it appeared and got worse after starting Tarceva, and now it appears infected on top of her chronic rash  Active Problems Hypokalemia -Potassium improved, 3.7 today  Nausea, vomiting, diarrhea, poor appetite, severe deconditioning, failure to thrive -All possibly due to Tarceva, encourage p.o. intake, PT/OT consultation, sit up in the chair and mobilize early, -Rule out C. difficile given recent prolonged antibiotic course, sent down twice however lab refused to run the sample because it was "too formed" -Gradually improving off Tarceva  History of DVT -Continue home Xarelto  Chronic combined  CHF, dilated cardiomyopathy -Most recent EF 25-30% with global hypokinesis, grade 1 diastolic dysfunction -Continu statin, Plavix, metoprolol -Recent cath done in June showed minimal CAD, 50% circumflex disease, it was felt that her cardiomyopathy was likely secondary to [place] which has been discontinued at that time. -Appears euvolemic, received IV fluids in the ED and again 10/16.  Avoid further IV fluids, encourage p.o. intake  History of non-small cell lung cancer with mets to the brain and neck -Hold Tarceva per pharmacy in the setting of active infection.  Of note, during her prior hospitalization with MRSA bacteremia it has been continued. -I wonder whether Tarceva is playing a major role in her rash, poor immune system, failure to thrive, poor appetite, diarrhea, nausea.  Patient's daughter is inclining towards stopping it.  -Discussed with Dr. Julien Nordmann this morning, recommends holding Tarceva right now and resuming it at a lower dose on discharge  Recent MRSA bacteremia -Per prior notes, patient did not want a PICC line and received IV vancomycin while hospitalized for a week and was discharged on linezolid.    Continue current antibiotics  History of dementia -Mild, alert and oriented x4, continue home medications  Paroxysmal A. Fib -Continue Xarelto, metoprolol  Hyperlipidemia -Continue statin  Normocytic anemia -No bleeding, likely in the setting of chronic illness  Scheduled Meds: . atorvastatin  80 mg Oral QHS  . cholecalciferol  50 mcg Oral Daily  . clopidogrel  75 mg Oral Q breakfast  . donepezil  10 mg Oral QHS  . feeding supplement  1 Container Oral BID BM  . Gerhardt's butt cream   Topical BID  . magnesium oxide  400 mg Oral Daily  . metoprolol succinate  25 mg Oral Daily  . multivitamin with minerals  1 tablet Oral QPM  . protein supplement  1 packet Oral q12n4p  . rivaroxaban  20 mg Oral QPM  . saccharomyces boulardii  250 mg Oral BID  . silver  sulfADIAZINE   Topical Daily  . vitamin B-12  1,000 mcg Oral QPM  . vitamin C  250 mg Oral QPM   Continuous Infusions: . meropenem (MERREM) IV 1 g (06/06/20 1141)  . sodium phosphate  Dextrose 5% IVPB 15 mmol (06/06/20 1129)  . vancomycin 500 mg (06/06/20 0956)   PRN Meds:.acetaminophen, liver oil-zinc oxide, oxyCODONE-acetaminophen, prochlorperazine  Diet Orders (From admission, onward)    Start     Ordered   06/03/20 1645  Diet regular Room service appropriate? Yes; Fluid consistency: Thin  Diet effective now       Question Answer Comment  Room service appropriate? Yes   Fluid consistency: Thin      06/03/20 1644          DVT prophylaxis:  rivaroxaban (XARELTO) tablet 20 mg     Code Status: DNR  Family Communication:  Dr. Cathlean Cower [phone], at bedside  Status is: Inpatient  Remains inpatient appropriate because:Inpatient level of care appropriate due to severity of illness  Dispo: The patient is from: Home              Anticipated d/c is to: SNF              Anticipated d/c date is: 3 days              Patient currently is not medically stable to d/c.  Consultants:  None   Procedures:  None   Microbiology  Blood cultures 1/4 bottles coag negative staph, staph epi  Antimicrobials: Vancomycin 10/15 >> Meropenem 10/15 >>   Objective: Vitals:   06/05/20 0552 06/05/20 1300 06/05/20 2121 06/06/20 0458  BP: 106/64 (!) 152/110 (!) 103/58 109/62  Pulse: 85 86 (!) 103 (!) 109  Resp: 18 20 20 20   Temp: 97.7 F (36.5 C) (!) 97.3 F (36.3 C) 97.8 F (36.6 C) 99.3 F (37.4 C)  TempSrc: Oral Oral Oral Oral  SpO2: 98% 90% (!) 71% 99%  Weight:        Intake/Output Summary (Last 24 hours) at 06/06/2020 1150 Last data filed at 06/06/2020 0524 Gross per 24 hour  Intake 908.23 ml  Output --  Net 908.23 ml   Filed Weights   06/03/20 1456  Weight: 59 kg    Examination:  Constitutional: Frail-appearing, in bed Eyes: No scleral icterus ENMT:  Moist mucous membranes Neck: normal, supple Respiratory: Diminished at the bases but overall clear, no wheezing Cardiovascular: Regular, no murmurs, no edema Abdomen: Soft, nontender, nondistended, bowel sounds positive Musculoskeletal: no clubbing / cyanosis.  Skin: Cellulitic rash on top of chronic rash on her back, slightly worse today Neurologic: No focal deficits      Data Reviewed: I have independently reviewed following labs and imaging studies   CBC: Recent Labs  Lab 06/03/20 1022 06/04/20 0557 06/05/20 0500 06/06/20 0442  WBC 13.7* 13.0* 12.6* 16.8*  NEUTROABS 11.7*  --   --   --   HGB 11.2* 11.3* 9.6* 9.7*  HCT 35.4* 34.8* 31.0* 29.8*  MCV 96.7 93.3 98.1 94.3  PLT 434* 362 354 175   Basic Metabolic Panel: Recent Labs  Lab 06/03/20 1022 06/04/20 0557 06/05/20 0500 06/05/20 1900 06/06/20 0442  NA 139 137 134*  --  135  K 2.4* 5.5* 3.0* 3.5 3.7  CL 101  104 104  --  103  CO2 27 22 24   --  24  GLUCOSE 123* 105* 106*  --  111*  BUN 19 11 12   --  10  CREATININE 0.93 0.75 0.64  --  0.65  CALCIUM 9.6 9.1 8.4*  --  8.4*  MG  --   --   --  1.9 1.9  PHOS  --   --   --   --  1.1*   Liver Function Tests: Recent Labs  Lab 06/03/20 1022 06/04/20 0557 06/05/20 0500  AST 17 37 13*  ALT 15 17 12   ALKPHOS 112 95 78  BILITOT 1.0 1.8* 0.6  PROT 5.8* 5.1* 4.4*  ALBUMIN 2.5* 2.0* 1.7*   Coagulation Profile: Recent Labs  Lab 06/03/20 1022  INR 4.3*   HbA1C: No results for input(s): HGBA1C in the last 72 hours. CBG: No results for input(s): GLUCAP in the last 168 hours.  Recent Results (from the past 240 hour(s))  Urine culture     Status: Abnormal (Preliminary result)   Collection Time: 06/03/20 10:23 AM   Specimen: In/Out Cath Urine  Result Value Ref Range Status   Specimen Description   Final    IN/OUT CATH URINE Performed at [facility] [address], [place], [place] [address]    Special Requests   Final    NONE Performed  at [facility], [place] [address] [place], [place] [address]    Culture (A)  Final    >=100,000 COLONIES/mL ESCHERICHIA COLI CULTURE REINCUBATED FOR BETTER GROWTH Performed at [facility], [place] [address], [place], [place] [address]    Report Status PENDING  Incomplete   Organism ID, Bacteria ESCHERICHIA COLI (A)  Final      Susceptibility   Escherichia coli - MIC*    AMPICILLIN 4 SENSITIVE Sensitive     CEFAZOLIN <=4 SENSITIVE Sensitive     CEFTRIAXONE <=0.25 SENSITIVE Sensitive     CIPROFLOXACIN >=4 RESISTANT Resistant     GENTAMICIN >=16 RESISTANT Resistant     IMIPENEM <=0.25 SENSITIVE Sensitive     NITROFURANTOIN <=16 SENSITIVE Sensitive     TRIMETH/SULFA <=20 SENSITIVE Sensitive     AMPICILLIN/SULBACTAM <=2 SENSITIVE Sensitive     PIP/TAZO <=4 SENSITIVE Sensitive     * >=100,000 COLONIES/mL ESCHERICHIA COLI  Respiratory Panel by RT PCR (Flu A&B, Covid) - Urine, Catheterized     Status: None   Collection Time: 06/03/20 10:23 AM   Specimen: Urine, Catheterized; Nasopharyngeal  Result Value Ref Range Status   SARS Coronavirus 2 by RT PCR NEGATIVE NEGATIVE Final    Comment: (NOTE) SARS-CoV-2 target nucleic acids are NOT DETECTED.  The SARS-CoV-2 RNA is generally detectable in upper respiratoy specimens during the acute phase of infection. The lowest concentration of SARS-CoV-2 viral copies this assay can detect is 131 copies/mL. A negative result does not preclude SARS-Cov-2 infection and should not be used as the sole basis for treatment or other patient management decisions. A negative result may occur with  improper specimen collection/handling, submission of specimen other than nasopharyngeal swab, presence of viral mutation(s) within the areas targeted by this assay, and inadequate number of viral copies (<131 copies/mL). A negative result must be combined with clinical observations, patient history, and epidemiological information. The expected  result is Negative.  Fact Sheet for Patients:  PinkCheek.be  Fact Sheet for Healthcare Providers:  GravelBags.it  This test is no t yet approved or cleared by the [place]  States FDA and  has been authorized for detection and/or diagnosis of SARS-CoV-2 by FDA under an Emergency Use Authorization (EUA). This EUA will remain  in effect (meaning this test can be used) for the duration of the COVID-19 declaration under Section 564(b)(1) of the Act, 21 U.S.C. section 360bbb-3(b)(1), unless the authorization is terminated or revoked sooner.     Influenza A by PCR NEGATIVE NEGATIVE Final   Influenza B by PCR NEGATIVE NEGATIVE Final    Comment: (NOTE) The Xpert Xpress SARS-CoV-2/FLU/RSV assay is intended as an aid in  the diagnosis of influenza from Nasopharyngeal swab specimens and  should not be used as a sole basis for treatment. Nasal washings and  aspirates are unacceptable for Xpert Xpress SARS-CoV-2/FLU/RSV  testing.  Fact Sheet for Patients: PinkCheek.be  Fact Sheet for Healthcare Providers: GravelBags.it  This test is not yet approved or cleared by the [place] FDA and  has been authorized for detection and/or diagnosis of SARS-CoV-2 by  FDA under an Emergency Use Authorization (EUA). This EUA will remain  in effect (meaning this test can be used) for the duration of the  Covid-19 declaration under Section 564(b)(1) of the Act, 21  U.S.C. section 360bbb-3(b)(1), unless the authorization is  terminated or revoked. Performed at [facility], [place] [address] [place], [place] [address]   Blood Culture (routine x 2)     Status: Abnormal   Collection Time: 06/03/20 10:30 AM   Specimen: BLOOD LEFT ARM  Result Value Ref Range Status   Specimen Description   Final    BLOOD LEFT ARM Performed at [facility] [address], [place], [place] [address]    Special Requests   Final    BOTTLES DRAWN AEROBIC AND ANAEROBIC Blood Culture adequate volume Performed at [facility] [address], [place], [place] [address]    Culture  Setup Time   Final    ANAEROBIC BOTTLE ONLY GRAM POSITIVE COCCI CRITICAL RESULT CALLED TO, READ BACK BY AND VERIFIED WITH: PHARMD M HICKS [id number] AT 46 AM BY CM    Culture (A)  Final    STAPHYLOCOCCUS EPIDERMIDIS THE SIGNIFICANCE OF ISOLATING THIS ORGANISM FROM A SINGLE SET OF BLOOD CULTURES WHEN MULTIPLE SETS ARE DRAWN IS UNCERTAIN. PLEASE NOTIFY THE MICROBIOLOGY DEPARTMENT WITHIN ONE WEEK IF SPECIATION AND SENSITIVITIES ARE REQUIRED. Performed at [facility], [place] [address], [place], [place] [address]    Report Status 06/06/2020 FINAL  Final  Blood Culture ID Panel (Reflexed)     Status: Abnormal   Collection Time: 06/03/20 10:30 AM  Result Value Ref Range Status   Enterococcus faecalis NOT DETECTED NOT DETECTED Final   Enterococcus Faecium NOT DETECTED NOT DETECTED Final   Listeria monocytogenes NOT DETECTED NOT DETECTED Final   Staphylococcus species DETECTED (A) NOT DETECTED Final    Comment: CRITICAL RESULT CALLED TO, READ BACK BY AND VERIFIED WITH: PHARMD M HICKS [id number] AT 855 BY CM    Staphylococcus aureus (BCID) NOT DETECTED NOT DETECTED Final   Staphylococcus epidermidis DETECTED (A) NOT DETECTED Final    Comment: Methicillin (oxacillin) resistant coagulase negative staphylococcus. Possible blood culture contaminant (unless isolated from more than one blood culture draw or clinical case suggests pathogenicity). No antibiotic treatment is indicated for blood  culture contaminants. CRITICAL RESULT CALLED TO, READ BACK BY AND VERIFIED WITH: PHARMD M HICKS [id number] AT 42 AM BY CM    Staphylococcus lugdunensis NOT DETECTED NOT DETECTED Final   Streptococcus  species NOT DETECTED NOT DETECTED Final   Streptococcus agalactiae NOT DETECTED NOT DETECTED  Final   Streptococcus pneumoniae NOT DETECTED NOT DETECTED Final   Streptococcus pyogenes NOT DETECTED NOT DETECTED Final   A.calcoaceticus-baumannii NOT DETECTED NOT DETECTED Final   Bacteroides fragilis NOT DETECTED NOT DETECTED Final   Enterobacterales NOT DETECTED NOT DETECTED Final   Enterobacter cloacae complex NOT DETECTED NOT DETECTED Final   Escherichia coli NOT DETECTED NOT DETECTED Final   Klebsiella aerogenes NOT DETECTED NOT DETECTED Final   Klebsiella oxytoca NOT DETECTED NOT DETECTED Final   Klebsiella pneumoniae NOT DETECTED NOT DETECTED Final   Proteus species NOT DETECTED NOT DETECTED Final   Salmonella species NOT DETECTED NOT DETECTED Final   Serratia marcescens NOT DETECTED NOT DETECTED Final   Haemophilus influenzae NOT DETECTED NOT DETECTED Final   Neisseria meningitidis NOT DETECTED NOT DETECTED Final   Pseudomonas aeruginosa NOT DETECTED NOT DETECTED Final   Stenotrophomonas maltophilia NOT DETECTED NOT DETECTED Final   Candida albicans NOT DETECTED NOT DETECTED Final   Candida auris NOT DETECTED NOT DETECTED Final   Candida glabrata NOT DETECTED NOT DETECTED Final   Candida krusei NOT DETECTED NOT DETECTED Final   Candida parapsilosis NOT DETECTED NOT DETECTED Final   Candida tropicalis NOT DETECTED NOT DETECTED Final   Cryptococcus neoformans/gattii NOT DETECTED NOT DETECTED Final   Methicillin resistance mecA/C DETECTED (A) NOT DETECTED Final    Comment: CRITICAL RESULT CALLED TO, READ BACK BY AND VERIFIED WITH: PHARMD M HICKS [id number] AT 855 AM BY CM Performed at [facility], [address] [place], [place] [address]   Blood Culture (routine x 2)     Status: None (Preliminary result)   Collection Time: 06/03/20 10:45 AM   Specimen: BLOOD  Result Value Ref Range Status   Specimen Description   Final    BLOOD LEFT ANTECUBITAL Performed at [facility], [place] [address], [place], [place] [address]    Special Requests   Final    BOTTLES DRAWN  AEROBIC AND ANAEROBIC Blood Culture adequate volume Performed at [facility] [address], [place], [place] [address]    Culture   Final    NO GROWTH 3 DAYS Performed at [facility], [place] [address], [place], [place] [address]    Report Status PENDING  Incomplete     Radiology Studies: No results found. Marzetta Board, MD, PhD [facility]  Between 7 am - 7 pm I am available, please contact me via Amion or Securechat  Between 7 pm - 7 am I am not available, please contact night coverage MD/APP via Amion

## 2020-06-06 NOTE — Consult Note (Signed)
[facility] Nurse Consult Note: Reason for Consult:Chornic dry skin/rash to back.  Dry, flaking and itchy. Now presents with cellulitis to lower back and left flank.  Systemic antibiotics in place.  Noted allergy to keflex. Wound type:infectious Pressure Injury POA: NA Measurement: generalized erythema and peeling epithelium  Warm to touch and tender.  More rubrous to left flank.  Wound bed: red and moist Drainage (amount, consistency, odor)moderate weeping.  No odor.   Periwound: Dry skin to back.  Dressing procedure/placement/frequency: Cleanse back and left flank with Ns and pat dry.  Apply silvadene cream to reddened areas daily.  Will not follow at this time.  Please re-consult if needed.  Domenic Moras MSN, RN, FNP-BC CWON Wound, Ostomy, Continence Nurse Pager [phone]

## 2020-06-07 ENCOUNTER — Ambulatory Visit: Payer: Medicare PPO | Admitting: Physical Therapy

## 2020-06-07 DIAGNOSIS — R627 Adult failure to thrive: Secondary | ICD-10-CM

## 2020-06-07 DIAGNOSIS — I420 Dilated cardiomyopathy: Secondary | ICD-10-CM

## 2020-06-07 DIAGNOSIS — R21 Rash and other nonspecific skin eruption: Secondary | ICD-10-CM

## 2020-06-07 DIAGNOSIS — C799 Secondary malignant neoplasm of unspecified site: Secondary | ICD-10-CM | POA: Diagnosis not present

## 2020-06-07 DIAGNOSIS — A419 Sepsis, unspecified organism: Secondary | ICD-10-CM | POA: Diagnosis not present

## 2020-06-07 DIAGNOSIS — L03312 Cellulitis of back [any part except buttock]: Secondary | ICD-10-CM | POA: Diagnosis not present

## 2020-06-07 DIAGNOSIS — I5042 Chronic combined systolic (congestive) and diastolic (congestive) heart failure: Secondary | ICD-10-CM | POA: Diagnosis not present

## 2020-06-07 DIAGNOSIS — R8271 Bacteriuria: Secondary | ICD-10-CM

## 2020-06-07 LAB — CBC
HCT: 29.4 % — ABNORMAL LOW (ref 36.0–46.0)
Hemoglobin: 9.7 g/dL — ABNORMAL LOW (ref 12.0–15.0)
MCH: 30.6 pg (ref 26.0–34.0)
MCHC: 33 g/dL (ref 30.0–36.0)
MCV: 92.7 fL (ref 80.0–100.0)
Platelets: 340 10*3/uL (ref 150–400)
RBC: 3.17 MIL/uL — ABNORMAL LOW (ref 3.87–5.11)
RDW: 17.2 % — ABNORMAL HIGH (ref 11.5–15.5)
WBC: 15.7 10*3/uL — ABNORMAL HIGH (ref 4.0–10.5)
nRBC: 0 % (ref 0.0–0.2)

## 2020-06-07 LAB — COMPREHENSIVE METABOLIC PANEL
ALT: 42 U/L (ref 0–44)
AST: 71 U/L — ABNORMAL HIGH (ref 15–41)
Albumin: 1.5 g/dL — ABNORMAL LOW (ref 3.5–5.0)
Alkaline Phosphatase: 76 U/L (ref 38–126)
Anion gap: 9 (ref 5–15)
BUN: 11 mg/dL (ref 8–23)
CO2: 22 mmol/L (ref 22–32)
Calcium: 8.5 mg/dL — ABNORMAL LOW (ref 8.9–10.3)
Chloride: 101 mmol/L (ref 98–111)
Creatinine, Ser: 0.5 mg/dL (ref 0.44–1.00)
GFR, Estimated: >60 mL/min (ref >60)
Glucose, Bld: 111 mg/dL — ABNORMAL HIGH (ref 70–99)
Potassium: 2.9 mmol/L — ABNORMAL LOW (ref 3.5–5.1)
Sodium: 132 mmol/L — ABNORMAL LOW (ref 135–145)
Total Bilirubin: 0.3 mg/dL (ref 0.3–1.2)
Total Protein: 4.3 g/dL — ABNORMAL LOW (ref 6.5–8.1)

## 2020-06-07 LAB — PHOSPHORUS
Phosphorus: <1 mg/dL — CL (ref 2.5–4.6)
Phosphorus: 1.8 mg/dL — ABNORMAL LOW (ref 2.5–4.6)

## 2020-06-07 LAB — MAGNESIUM: Magnesium: 1.7 mg/dL (ref 1.7–2.4)

## 2020-06-07 MED ORDER — DIPHENHYDRAMINE HCL 50 MG/ML IJ SOLN: 25.0000 mg | Freq: Once | INTRAMUSCULAR | Status: DC | PRN

## 2020-06-07 MED ORDER — MAGNESIUM SULFATE 2 GM/50ML IV SOLN
2.0000 g | Freq: Once | INTRAVENOUS | Status: AC
  Administered 2020-06-07: 2 g via INTRAVENOUS
  Filled 2020-06-07: qty 50

## 2020-06-07 MED ORDER — CEPHALEXIN 125 MG/5ML PO SUSR
125.0000 mg | Freq: Once | ORAL | Status: AC
  Administered 2020-06-07: 125 mg via ORAL
  Filled 2020-06-07: qty 5

## 2020-06-07 MED ORDER — POTASSIUM CHLORIDE 10 MEQ/100ML IV SOLN
10.0000 meq | INTRAVENOUS | Status: AC
  Administered 2020-06-07 (×3): 10 meq via INTRAVENOUS
  Filled 2020-06-07 (×3): qty 100

## 2020-06-07 MED ORDER — CEPHALEXIN 250 MG PO CAPS
250.0000 mg | ORAL_CAPSULE | Freq: Once | ORAL | Status: AC
  Administered 2020-06-07: 250 mg via ORAL
  Filled 2020-06-07: qty 1

## 2020-06-07 MED ORDER — EPINEPHRINE 0.3 MG/0.3ML IJ SOAJ
0.3000 mg | Freq: Once | INTRAMUSCULAR | Status: DC | PRN
  Filled 2020-06-07: qty 0.6

## 2020-06-07 MED ORDER — K PHOS MONO-SOD PHOS DI & MONO 155-852-130 MG PO TABS
500.0000 mg | ORAL_TABLET | Freq: Two times a day (BID) | ORAL | Status: AC
  Administered 2020-06-07 – 2020-06-08 (×3): 500 mg via ORAL
  Filled 2020-06-07 (×5): qty 2

## 2020-06-07 MED ORDER — NYSTATIN 100000 UNIT/GM EX POWD
Freq: Two times a day (BID) | CUTANEOUS | Status: DC
  Administered 2020-06-09: 1 via TOPICAL
  Filled 2020-06-07 (×2): qty 15

## 2020-06-07 MED ORDER — TRIAMCINOLONE ACETONIDE 0.1 % EX OINT
TOPICAL_OINTMENT | Freq: Two times a day (BID) | CUTANEOUS | Status: DC
  Administered 2020-06-08: 1 via TOPICAL
  Filled 2020-06-07 (×4): qty 80

## 2020-06-07 MED ORDER — CEFAZOLIN SODIUM-DEXTROSE 1-4 GM/50ML-% IV SOLN: 1.0000 g | Freq: Three times a day (TID) | INTRAVENOUS | Status: DC

## 2020-06-07 MED ORDER — POTASSIUM CHLORIDE 20 MEQ/15ML (10%) PO SOLN
40.0000 meq | Freq: Once | ORAL | Status: AC
  Administered 2020-06-07: 40 meq via ORAL
  Filled 2020-06-07: qty 30

## 2020-06-07 MED ORDER — MORPHINE SULFATE (PF) 2 MG/ML IV SOLN
2.0000 mg | Freq: Once | INTRAVENOUS | Status: AC
  Administered 2020-06-07: 2 mg via INTRAVENOUS
  Filled 2020-06-07: qty 1

## 2020-06-07 MED ORDER — CEPHALEXIN 250 MG PO CAPS
250.0000 mg | ORAL_CAPSULE | Freq: Once | ORAL | Status: DC
  Filled 2020-06-07: qty 1

## 2020-06-07 MED ORDER — SODIUM PHOSPHATES 45 MMOLE/15ML IV SOLN
15.0000 mmol | Freq: Once | INTRAVENOUS | Status: AC
  Administered 2020-06-07: 15 mmol via INTRAVENOUS
  Filled 2020-06-07: qty 5

## 2020-06-07 NOTE — Progress Notes (Signed)
Occupational Therapy Treatment Patient Details Name: Valerie [name] MRN: [id number] DOB: 1946/06/24 Today's Date: 06/07/2020    History of present illness Pt is 74 yo female with hx including metastatic lung CA, adenocarcinoma with brain and neck mets, hx of craniotomy for tumor resection, whole brain radiation, R thyroidectomy with radical neck dissection, CHF, MRSA, and UTIs.  Pt presented to hospital with L sided flank/back pain as well as FTT and progressive weakness. Pt admitted with sepsis due to back cellulitis and possible UTI.   OT comments  Unfortunately, pt showed regression towards her OT goals and overall ADL and functional mobility performance today, compared to previous session. Patient limited by all over body pain, especially back pain with visible bleeding all over back area, poor sitting balance with posterior and left lateral leans, and severe weakness with inability to hold head up more than 5 seconds at a time, along with deficits noted below. Pt continues to demonstrate fair rehab potential and could benefit from continued skilled OT to increase safety and independence with ADLs and functional transfers to allow pt to return home safely and reduce caregiver burden and fall risk.   Follow Up Recommendations  Home health OT;Supervision/Assistance - 24 hour (Pt will require near total care. Will need to ensure that family is able to provide all comfort care and 24/7 assitance, otherwise pt will require SNF.)    Equipment Recommendations  None recommended by OT    Recommendations for Other Services      Precautions / Restrictions Precautions Precautions: Fall Precaution Comments: wounds - entire back excoriated/superficial wounds/cellulitis Restrictions Weight Bearing Restrictions: No       Mobility Bed Mobility  Up in recliner                Transfers                 General transfer comment: Pt was repositioned in recliner with LEs down and  back of chair upright. Attempted to work with pt on scooting anteriorly in chair with use of draw sheet, however pt moaning with each attempt, and pushing herself back. Pt able to use UEs on arms of chair with cuing to pull her upper body forward briefly while moaning in pain/effort and with Moderate assist, but unable to maintain upright sitting more than 1 second. Attempted to position pt with Denna Haggard, however pt unable to maintain upright sitting needed to place UEs on bars, and was assessed to be too lethargic and in too much pain to attempt supported stand today.    Balance Overall balance assessment: Needs assistance   Sitting balance-Leahy Scale: Poor Sitting balance - Comments: kyphotic but looses balance posteriorly - encouraged reaching forward to hands on knees then to bars of Denna Haggard, but pt only able to maintain upright sitting balance for short period of time, and requiring mod A Postural control: Posterior lean;Left lateral lean     Standing balance comment: Unable to stand today.                           ADL either performed or assessed with clinical judgement   ADL                                         General ADL Comments: Pt hoyered up to recliner by Nursing. Attempted to work  with pt on recliner level ADLs,however pt groaning with all movement and endorsing pain "Everywhere" and resisting active participation in ADLs.     Vision   Additional Comments: Pt with enlongated neck extensor muscles with head facing pt's lap. Pt with unfocused gaze when working on head mobility.   Perception     Praxis      Cognition Arousal/Alertness: Lethargic Behavior During Therapy: Flat affect;Anxious Overall Cognitive Status: No family/caregiver present to determine baseline cognitive functioning                                 General Comments: Followed simple commands with increased time.  Fatigues quickly.        Exercises  Other Exercises Other Exercises: Pt positioned in recliner to promote neck extension. Pt instructed to raise head then push head back into pillow for 3 second cout x2, then 5 second count x5 with head retraction exercises. Pt declined more reps stating, "I need to be done now." Pt was encouraged to continue these exercises on her own ad lib. Attempted to assist with BUE AAROM, but pt pulling her arms back into her body tightly and stating all movement hurt too much.   Shoulder Instructions       General Comments      Pertinent Vitals/ Pain       Pain Assessment: Faces Faces Pain Scale: Hurts whole lot Pain Location: Back-wounds, pt reports "everywhere" Pain Descriptors / Indicators: Grimacing;Moaning;Guarding Pain Intervention(s): Limited activity within patient's tolerance;RN gave pain meds during session;Utilized relaxation techniques (Applied quilted pad to back of recliner for pt's back to rest on.)  Home Living                                          Prior Functioning/Environment              Frequency  Min 2X/week        Progress Toward Goals  OT Goals(current goals can now be found in the care plan section)  Progress towards OT goals: Not progressing toward goals - comment  Acute Rehab OT Goals Patient Stated Goal: To rest  Plan Discharge plan remains appropriate    Co-evaluation                 AM-PAC OT "6 Clicks" Daily Activity     Outcome Measure   Help from another person eating meals?: A Lot Help from another person taking care of personal grooming?: A Lot Help from another person toileting, which includes using toliet, bedpan, or urinal?: Total Help from another person bathing (including washing, rinsing, drying)?: Total Help from another person to put on and taking off regular upper body clothing?: Total Help from another person to put on and taking off regular lower body clothing?: Total 6 Click Score: 8    End of  Session    OT Visit Diagnosis: Unsteadiness on feet (R26.81);Other abnormalities of gait and mobility (R26.89);Muscle weakness (generalized) (M62.81);Pain   Activity Tolerance Patient limited by fatigue;Patient limited by pain   Patient Left in chair;with call bell/phone within reach   Nurse Communication Mobility status (Request for pain meds)        Time: 2585-2778 OT Time Calculation (min): 24 min  Charges: OT General Charges $OT Visit: 1 Visit OT Treatments $Therapeutic Activity: 8-22 mins $  Therapeutic Exercise: 8-22 mins  Valerie [name], [place] [facility] Office: [phone] 06/07/2020    Valerie [name] 06/07/2020, 11:52 AM

## 2020-06-07 NOTE — Consult Note (Addendum)
[facility]    Date of Admission:  06/03/2020     Reason for Consult: rash    Referring Provider: Cruzita Lederer    Lines:  peripheral  Abx: 10/15-c vanc 10/15-c meropenem        Assessment: Valerie [name] is a 74 y.o. 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 Id consulted for continued elevated wbc count    Chronic pyuria with assymptomatic bacteriuria. No clinical syndrome pyelo/cystitis. No need for abx bcx 1 of 2 set CoNS contaminant  Previous mrsa bacteremia appears treated   Rash I am concerned about tarceva related known side effect. Clinically doesn't appear to be cellulitis. It is currently being hold. I would observe off abx for now. She is high risk for cdiff and abx related toxicity.   I reviewed literature on tarceva related rash. At this time I have placed emoliant and anti-inflammatory ointment for the rash. There is intetrigo desquamation which I think could benefit from good skin care/nystatin powder  While there is a small area that appear symmetric in the thigh appearing more erythematous, again exam and hx is not c/w cellulitis, and I would manage as above  Suspect leukocytosis stress demargination and medication effect.   Reference: [place]. Erlotinib-related skin toxicities tx strategies. JAAD. Sept, 2013 (vol 69 (3)).  Plan: 1. Hold abx for now. Wrote instruction for skin care with kenalog ointment 2. I will follow rash 3. Consider outpatient dermatology evaluation   Discussed with dr Cruzita Lederer who relate his impression on his admission that she did have some cellulitic component which had improved quickly with  abx/stopping tarceva.  4. Will place her on cefazolin for a few more days (to finish 7-10 day abx) and observe clinical change 5. Will ask our pharmacy team to do graded challenge to both cefazolin and cephalexin prior to starting full dose  Active Problems:   Pressure injury of skin   Cellulitis   Scheduled Meds: . atorvastatin  80 mg Oral QHS  . cholecalciferol  50 mcg Oral Daily  . clopidogrel  75 mg Oral Q breakfast  . donepezil  10 mg Oral QHS  . feeding supplement  1 Container Oral BID BM  . Gerhardt's butt cream   Topical BID  . magnesium oxide  400 mg Oral Daily  . metoprolol succinate  25 mg Oral Daily  . multivitamin with minerals  1 tablet Oral QPM  . nystatin   Topical BID  . phosphorus  500 mg Oral BID  . protein supplement  1 packet Oral q12n4p  . rivaroxaban  20 mg Oral QPM  . saccharomyces boulardii  250 mg Oral BID  . triamcinolone ointment   Topical BID  . vitamin B-12  1,000 mcg Oral QPM  . vitamin C  250 mg Oral QPM   Continuous Infusions: . magnesium sulfate bolus IVPB    . sodium phosphate  Dextrose 5% IVPB 15 mmol (06/07/20 0925)   PRN Meds:.acetaminophen, liver oil-zinc oxide, oxyCODONE-acetaminophen, prochlorperazine  HPI: Valerie [name] is a 74 y.o. female recurrent metastatic NSCLC on palliative immunotherapy, hx brain radiation, chemo induced dilated cardiomyopathy, chart hx dementia, hx svt on xarelto, thyroid adenoma s/p resection, osteoporosis, admitted 10/15  for left flank and lower back pain, along with failure to thrive picture, a few weeks back rash, treated as cellulitis. Id consulted for continued elevated wbc count  Oncologic history, via Dr Worthy Flank note 04/2020 outpatient: Metastatic NSCLC (brain) Initially dxed as stage IIb 2002 Prior Therapy: 1. s/p left lower lobectomy 2002 with adjuvant chemoxrt (platnum/paclitaxel by 2003) 2. s/p Celebrex and [facility] for 1 year. 3. s/p left occipital craniotomy for  tumor resection 2007 and WBSRT; s/p gamma knife 2009 for recurrence 4. S/p right thyroidectomy with radical neck dissection 2013 5. Recurrent disease by excisional biopsy of right cervical lymph node ?2015. Tarceva 150 mg 06/03/2007-02/2014 secondary to disease progression. Status post 79 cycles. 6. palliative radiotherapy to the enlarging right upper lobe lung nodule under the care of Dr. Valere Dross. 7. Treatment at [facility] on STUDY [id number] 300 mg by mouth daily for 21 days discontinued secondary to study deviation and inability for the patient to keep her appointment at [facility] secondary to RLE fx (orif fib/medial malleolus) 8. Tagrisso 11/25/2014-02/13/2020, status post 62 months of treatment; CHF ef 20-25% -current therapy        1. Tarceva 150 mg daily restarted since 02/26/2020   ID history: -03/22/20 admission mrsa bacteremia; tee negative8/2021 IV vanc 1 week --> linezolid 2 weeks -abx allergy listed cephalexin; takes cefazolin in the past fine; also had taken cephalexin in 2020 and 2016 -chart hx recurrent uti; various enterobactereciae including Amb-C organisms; no ESBL noted tracking back to 2016  Chronic fatigue/weakness mostly bedbound. Poor intake. At least 3 weeks new rash in the back. Itchy, not painful. However she told me she is painful all over during interview. Also reports nausea/vomiting as well the past few weeks  Denies f/c, diarrhea, dysphagia, pain with swallowing Denies frequency/urgency/dysuria Denies headache, cough, sob Denies increased LE swelling Denies focal joint pain  Lives with husband who is a physician   Hospital course: 10/15 admission afebrile; relative tachycardic Wbc 14 UA pyuria Pan ct unremarkable Started on vanc/meropenem bcx 1 of 2 set staph epi ucx ecoli (R cipro; S bactrim, cefazolin, ceftriaxone), and enterobacter cloacae (R cefazolin; S cipro, bactrim)  depsite abx, her wbc remains mild-moderately elevated. She  continues without fever. Rash appears the same mostly, perhaps mild improvement per nursing. She is getting wound care with silvadene cream to the back portion  Review of Systems: Review of Systems  Neurological: Positive for seizures.   Negative 11 point ros unless mentioned above  Past Medical History:  Diagnosis Date  . Abnormal Pap smear 2006  . Anal fistula   . Ankle fracture   . Anxiety   . Arthritis   . Atrophic vaginitis 2008  . Cataract   . Dementia ([place]) 2009  . Dyspareunia 2008  . H/O osteoporosis   . H/O varicella   . H/O vitamin D deficiency   . Headache 07/24/2016  . Heart murmur   . History of measles, mumps, or rubella   . History of radiation therapy 07/28/13- 08/10/13   right lung metastasis 5000 cGy 10 sessions  . Hypertension   . Lung cancer ([place]) dx'd 2002  . Lung cancer ([place])   . Lung cancer ([place])   . Lung metastasis ([place])    PET scan 05/05/13, RUL lung nodule  . Metastasis to brain [facility]) dx'd 2008  . 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 Nodule of right lung CT- 06/03/12   RIGHT UPPER LOBE  .  Nodule of right lung 06/03/12   Upper Lobe  . On antineoplastic chemotherapy    TARCEVA  . Osteoporosis 2010  . Primary cancer of right upper lobe of lung ([place]) 04/22/2009   Qualifier: Diagnosis of  By: Nils Pyle CMA ([place]), Mearl Latin    . Shortness of breath    hx lung ca   . Status post chemotherapy 2003   CARBOPLATIN/PACLITAXEL /STATUS POST CLINICAL TRAIL OF CELEBREX AND IRESSA AT [facility] FOR 1 YEAR  . Status post radiation therapy 2003   LEFT LUNG  . Status post radiation therapy 11/07/2005   WHOLE BRAIN: DR Larkin [facility]  . Status post radiation therapy 06/02/2008   GAMMA KNIFE OF RESECTED CAVITAY  . Thyroid adenoma    ?  [name] Thyroid cancer ([place]) 10/18/11 bx   adenoid nodules   . Yeast infection     Social History   Tobacco Use  . Smoking status: Never Smoker  . Smokeless tobacco: Never Used  . Tobacco comment: smoked few years in college    Vaping Use  . Vaping Use: Never used  Substance Use Topics  . Alcohol use: No  . Drug use: No    Family History  Problem Relation Age of Onset  . Gait disorder Mother   . Cancer Mother        Meningioma  . Cancer Father        Pancreatic  . Heart failure Sister   . Cancer Maternal Aunt        menigeoma   Allergies  Allergen Reactions  . Keflex [Cephalexin] Itching and Rash    Throat swelling- Patient has tolerated Rocephin & Cefdinir since this reaction    OBJECTIVE: Blood pressure (!) 104/53, pulse 96, temperature 99.5 F (37.5 C), temperature source Oral, resp. rate 20, weight 59 kg, SpO2 100 %.  Physical Exam Chronically ill appearing, weak, soft voice, conversant and follows exam/interview however Heent: generalized alopecia; atraumatic; per; conj clear; dry oral mucosa Neck supple cv rrr distant heart sound; jvp not elevated Lungs normal respiratory effort abd soft, nontender Ext trace bilateral edema msk no synovitis, back spine tenderness; right ankle joint without swelling or fluctuance  Skin diffuse dry skin, involving lips; no mucosal ulceration; there is maceration and well demarcated mild erythema superimposed with squamous rash on lower back very well demarcated; on the bilateral thigh proximally in the back slight erythema less well demarcated -- there is some edema in soft tissue, but nontender and no fluctuance; there is no chronic ulcers outside of superfical maceration in the affected skin area Neuro: 4-5/5 symmetric strength; cn2-12 appears intact Psych: oriented and alert  Lab Results Lab Results  Component Value Date   WBC 15.7 (H) 06/07/2020   HGB 9.7 (L) 06/07/2020   HCT 29.4 (L) 06/07/2020   MCV 92.7 06/07/2020   PLT 340 06/07/2020    Lab Results  Component Value Date   CREATININE 0.50 06/07/2020   BUN 11 06/07/2020   NA 132 (L) 06/07/2020   K 2.9 (L) 06/07/2020   CL 101 06/07/2020   CO2 22 06/07/2020    Lab Results  Component  Value Date   ALT 42 06/07/2020   AST 71 (H) 06/07/2020   ALKPHOS 76 06/07/2020   BILITOT 0.3 06/07/2020     Microbiology: Recent Results (from the past 240 hour(s))  Urine culture     Status: Abnormal   Collection Time: 06/03/20 10:23 AM   Specimen: In/Out Cath Urine  Result Value Ref Range Status  Specimen Description   Final    IN/OUT CATH URINE Performed at [facility] [address], [place], [place] [address]    Special Requests   Final    NONE Performed at [facility], [place] [address] [place], [place] [address]    Culture (A)  Final    >=100,000 COLONIES/mL ESCHERICHIA COLI >=100,000 COLONIES/mL ENTEROBACTER CLOACAE    Report Status 06/06/2020 FINAL  Final   Organism ID, Bacteria ESCHERICHIA COLI (A)  Final   Organism ID, Bacteria ENTEROBACTER CLOACAE (A)  Final      Susceptibility   Enterobacter cloacae - MIC*    CEFAZOLIN >=64 RESISTANT Resistant     CIPROFLOXACIN 1 SENSITIVE Sensitive     GENTAMICIN <=1 SENSITIVE Sensitive     IMIPENEM 0.5 SENSITIVE Sensitive     NITROFURANTOIN 64 INTERMEDIATE Intermediate     TRIMETH/SULFA <=20 SENSITIVE Sensitive     PIP/TAZO 16 SENSITIVE Sensitive     * >=100,000 COLONIES/mL ENTEROBACTER CLOACAE   Escherichia coli - MIC*    AMPICILLIN 4 SENSITIVE Sensitive     CEFAZOLIN <=4 SENSITIVE Sensitive     CEFTRIAXONE <=0.25 SENSITIVE Sensitive     CIPROFLOXACIN >=4 RESISTANT Resistant     GENTAMICIN >=16 RESISTANT Resistant     IMIPENEM <=0.25 SENSITIVE Sensitive     NITROFURANTOIN <=16 SENSITIVE Sensitive     TRIMETH/SULFA <=20 SENSITIVE Sensitive     AMPICILLIN/SULBACTAM <=2 SENSITIVE Sensitive     PIP/TAZO <=4 SENSITIVE Sensitive     * >=100,000 COLONIES/mL ESCHERICHIA COLI  Respiratory Panel by RT PCR (Flu A&B, Covid) - Urine, Catheterized     Status: None   Collection Time: 06/03/20 10:23 AM   Specimen: Urine, Catheterized; Nasopharyngeal  Result Value Ref Range Status   SARS  Coronavirus 2 by RT PCR NEGATIVE NEGATIVE Final    Comment: (NOTE) SARS-CoV-2 target nucleic acids are NOT DETECTED.  The SARS-CoV-2 RNA is generally detectable in upper respiratoy specimens during the acute phase of infection. The lowest concentration of SARS-CoV-2 viral copies this assay can detect is 131 copies/mL. A negative result does not preclude SARS-Cov-2 infection and should not be used as the sole basis for treatment or other patient management decisions. A negative result may occur with  improper specimen collection/handling, submission of specimen other than nasopharyngeal swab, presence of viral mutation(s) within the areas targeted by this assay, and inadequate number of viral copies (<131 copies/mL). A negative result must be combined with clinical observations, patient history, and epidemiological information. The expected result is Negative.  Fact Sheet for Patients:  PinkCheek.be  Fact Sheet for Healthcare Providers:  GravelBags.it  This test is no t yet approved or cleared by the [place] FDA and  has been authorized for detection and/or diagnosis of SARS-CoV-2 by FDA under an Emergency Use Authorization (EUA). This EUA will remain  in effect (meaning this test can be used) for the duration of the COVID-19 declaration under Section 564(b)(1) of the Act, 21 U.S.C. section 360bbb-3(b)(1), unless the authorization is terminated or revoked sooner.     Influenza A by PCR NEGATIVE NEGATIVE Final   Influenza B by PCR NEGATIVE NEGATIVE Final    Comment: (NOTE) The Xpert Xpress SARS-CoV-2/FLU/RSV assay is intended as an aid in  the diagnosis of influenza from Nasopharyngeal swab specimens and  should not be used as a sole basis for treatment. Nasal washings and  aspirates are unacceptable for Xpert Xpress SARS-CoV-2/FLU/RSV  testing.  Fact Sheet for  Patients:  PinkCheek.be  Fact Sheet for Healthcare Providers: GravelBags.it  This test is not yet approved or cleared by the [place] FDA and  has been authorized for detection and/or diagnosis of SARS-CoV-2 by  FDA under an Emergency Use Authorization (EUA). This EUA will remain  in effect (meaning this test can be used) for the duration of the  Covid-19 declaration under Section 564(b)(1) of the Act, 21  U.S.C. section 360bbb-3(b)(1), unless the authorization is  terminated or revoked. Performed at [facility], [place] [address], [place], [place] [address]   Blood Culture (routine x 2)     Status: Abnormal   Collection Time: 06/03/20 10:30 AM   Specimen: BLOOD LEFT ARM  Result Value Ref Range Status   Specimen Description   Final    BLOOD LEFT ARM Performed at [facility] [address], [place], [place] [address]    Special Requests   Final    BOTTLES DRAWN AEROBIC AND ANAEROBIC Blood Culture adequate volume Performed at [facility] [address], [place], [place] [address]    Culture  Setup Time   Final    ANAEROBIC BOTTLE ONLY GRAM POSITIVE COCCI CRITICAL RESULT CALLED TO, READ BACK BY AND VERIFIED WITH: PHARMD M HICKS [id number] AT 91 AM BY CM    Culture (A)  Final    STAPHYLOCOCCUS EPIDERMIDIS THE SIGNIFICANCE OF ISOLATING THIS ORGANISM FROM A SINGLE SET OF BLOOD CULTURES WHEN MULTIPLE SETS ARE DRAWN IS UNCERTAIN. PLEASE NOTIFY THE MICROBIOLOGY DEPARTMENT WITHIN ONE WEEK IF SPECIATION AND SENSITIVITIES ARE REQUIRED. Performed at [facility], [place] [address] [place], [place] [address]    Report Status 06/06/2020 FINAL  Final  Blood Culture ID Panel (Reflexed)     Status: Abnormal   Collection Time: 06/03/20 10:30 AM  Result Value Ref Range Status   Enterococcus faecalis NOT DETECTED NOT DETECTED Final   Enterococcus Faecium NOT DETECTED NOT  DETECTED Final   Listeria monocytogenes NOT DETECTED NOT DETECTED Final   Staphylococcus species DETECTED (A) NOT DETECTED Final    Comment: CRITICAL RESULT CALLED TO, READ BACK BY AND VERIFIED WITH: PHARMD M HICKS [id number] AT 855 BY CM    Staphylococcus aureus (BCID) NOT DETECTED NOT DETECTED Final   Staphylococcus epidermidis DETECTED (A) NOT DETECTED Final    Comment: Methicillin (oxacillin) resistant coagulase negative staphylococcus. Possible blood culture contaminant (unless isolated from more than one blood culture draw or clinical case suggests pathogenicity). No antibiotic treatment is indicated for blood  culture contaminants. CRITICAL RESULT CALLED TO, READ BACK BY AND VERIFIED WITH: PHARMD M HICKS [id number] AT 29 AM BY CM    Staphylococcus lugdunensis NOT DETECTED NOT DETECTED Final   Streptococcus species NOT DETECTED NOT DETECTED Final   Streptococcus agalactiae NOT DETECTED NOT DETECTED Final   Streptococcus pneumoniae NOT DETECTED NOT DETECTED Final   Streptococcus pyogenes NOT DETECTED NOT DETECTED Final   A.calcoaceticus-baumannii NOT DETECTED NOT DETECTED Final   Bacteroides fragilis NOT DETECTED NOT DETECTED Final   Enterobacterales NOT DETECTED NOT DETECTED Final   Enterobacter cloacae complex NOT DETECTED NOT DETECTED Final   Escherichia coli NOT DETECTED NOT DETECTED Final   Klebsiella aerogenes NOT DETECTED NOT DETECTED Final   Klebsiella oxytoca NOT DETECTED NOT DETECTED Final   Klebsiella pneumoniae NOT DETECTED NOT DETECTED Final   Proteus species NOT DETECTED NOT DETECTED Final   Salmonella species NOT DETECTED NOT DETECTED Final   Serratia marcescens NOT DETECTED NOT DETECTED Final   Haemophilus  influenzae NOT DETECTED NOT DETECTED Final   Neisseria meningitidis NOT DETECTED NOT DETECTED Final   Pseudomonas aeruginosa NOT DETECTED NOT DETECTED Final   Stenotrophomonas maltophilia NOT DETECTED NOT DETECTED Final   Candida albicans NOT DETECTED NOT DETECTED  Final   Candida auris NOT DETECTED NOT DETECTED Final   Candida glabrata NOT DETECTED NOT DETECTED Final   Candida krusei NOT DETECTED NOT DETECTED Final   Candida parapsilosis NOT DETECTED NOT DETECTED Final   Candida tropicalis NOT DETECTED NOT DETECTED Final   Cryptococcus neoformans/gattii NOT DETECTED NOT DETECTED Final   Methicillin resistance mecA/C DETECTED (A) NOT DETECTED Final    Comment: CRITICAL RESULT CALLED TO, READ BACK BY AND VERIFIED WITH: PHARMD M HICKS [id number] AT 855 AM BY CM Performed at [facility], [place] [address] [place], [place] [address]   Blood Culture (routine x 2)     Status: None (Preliminary result)   Collection Time: 06/03/20 10:45 AM   Specimen: BLOOD  Result Value Ref Range Status   Specimen Description   Final    BLOOD LEFT ANTECUBITAL Performed at [facility], [place] [address], [place], [place] [address]    Special Requests   Final    BOTTLES DRAWN AEROBIC AND ANAEROBIC Blood Culture adequate volume Performed at [facility] [address] [place], [place] [address]    Culture   Final    NO GROWTH 4 DAYS Performed at [facility], [place] [address], [place], [place] [address]    Report Status PENDING  Incomplete    Imaging: 10/15 ct abd pelv chest (angio) with contrast 1. No definite evidence of pulmonary embolus. 2. Status post left lower lobectomy with postsurgical changes seen posteriorly. 3. Small left pleural effusion is noted. 4. Aortic atherosclerosis. 5. At least 2 uterine fibroids are noted. 6. No acute abnormality seen in the abdomen or pelvis.    Jabier Mutton, [facility] [place] [phone] pager    06/07/2020, 10:17 AM

## 2020-06-07 NOTE — Progress Notes (Signed)
PROGRESS NOTE  Valerie [name]:[id number] DOB: 1945-09-20 DOA: 06/03/2020 PCP: [name], MD   LOS: 4 days   Brief Narrative / Interim history: 74 year old female with history of metastatic non-small cell lung cancer, adenocarcinoma, with brain and neck mets, with history of craniotomy for tumor resection, whole brain radiation, gamma knife, right thyroidectomy with radical neck dissection, currently on Tarceva started in July 9371, systolic CHF, recent MRSA bacteremia, recurrent UTIs including ESBL organisms came into the hospital with left-sided flank and back pain as well as failure to thrive, progressive weakness and immobility.  She has been having poor p.o. intake, nausea, vomiting, diarrhea for the past few weeks.  Subjective / 24h Interval events: Reports slightly increased appetite and less diarrhea.  Her back is hurting on the right side and left side is not bothering her as much  Assessment & Plan: Principal Problem Sepsis due to back cellulitis -Patient with large area of cellulitis spanning from her buttocks all the way to the shoulder blades, started on broad-spectrum antibiotics given history of drug-resistant infections she was placed on vancomycin and meropenem.  Part of the problem is patient's overall deconditioning and generalized weakness and the fact that she is unable to sit up and constantly puts pressure onto her cellulitic areas.  This will be very difficult to heal, this was discussed with the husband who wishes for her to remain hospitalized until her cellulitis is gone.  I suspect she either needs prolonged intravenous antibiotics versus prolonged oral, I have consulted ID, appreciate input -She had a noncellulitic rash initially after starting Tarceva which was very pruritic in nature and has some excoriation on her back due to scratching  Active Problems Hypokalemia, hypophosphatemia, hypomagnesemia -Continue to aggressively replete, potassium 2.9,  phosphorus undetectably low, at 1.7.  Monitor daily.  Bacteriuria -She is growing E. coli and Enterobacter in the urine however had no symptoms, suspect simple bacteriuria rather than urinary tract infection.  Nausea, vomiting, diarrhea, poor appetite, severe deconditioning, failure to thrive -All possibly due to Tarceva side effects, encourage p.o. intake, PT/OT consultation, sit up in the chair and mobilize early, -Rule out C. difficile given recent prolonged antibiotic course, sent down twice however lab refused to run the sample because it was "too formed".   -Her diarrhea is slowly improving of Tarceva  History of DVT -Continue home Xarelto  Chronic combined CHF, dilated cardiomyopathy -Most recent EF 25-30% with global hypokinesis, grade 1 diastolic dysfunction -Continu statin, Plavix, metoprolol -Recent cath done in June showed minimal CAD, 50% circumflex disease, it was felt that her cardiomyopathy was likely secondary to [place] which has been discontinued at that time. -Appears euvolemic, received IV fluids in the ED and again 10/16.  Avoid further IV fluids, encourage p.o. intake  History of non-small cell lung cancer with mets to the brain and neck -Hold Tarceva per pharmacy in the setting of active infection.  Of note, during her prior hospitalization with MRSA bacteremia it has been continued. -I wonder whether Tarceva is playing a major role in her rash, poor immune system, failure to thrive, poor appetite, diarrhea, nausea.  Patient's daughter is inclining towards stopping it.  -Discussed with Dr. Julien Nordmann on 10/18, recommends holding Tarceva right now and resuming it at a lower dose on discharge, discussed with husband and patient as well, they are agreeable  Recent MRSA bacteremia -Per prior notes, patient did not want a PICC line and received IV vancomycin while hospitalized for a week and was discharged on linezolid.  Continue current antibiotics, ID to see as  well  History of dementia -Mild, alert and oriented x4, continue home medications  Paroxysmal A. Fib -Continue Xarelto, metoprolol  Hyperlipidemia -Continue statin  Normocytic anemia -No bleeding, likely in the setting of chronic illness  Scheduled Meds: . atorvastatin  80 mg Oral QHS  . cholecalciferol  50 mcg Oral Daily  . clopidogrel  75 mg Oral Q breakfast  . donepezil  10 mg Oral QHS  . feeding supplement  1 Container Oral BID BM  . Gerhardt's butt cream   Topical BID  . magnesium oxide  400 mg Oral Daily  . metoprolol succinate  25 mg Oral Daily  . multivitamin with minerals  1 tablet Oral QPM  . phosphorus  500 mg Oral BID  . protein supplement  1 packet Oral q12n4p  . rivaroxaban  20 mg Oral QPM  . saccharomyces boulardii  250 mg Oral BID  . silver sulfADIAZINE   Topical Daily  . vitamin B-12  1,000 mcg Oral QPM  . vitamin C  250 mg Oral QPM   Continuous Infusions: . meropenem (MERREM) IV 1 g (06/07/20 0503)  . potassium chloride 10 mEq (06/07/20 0859)  . sodium phosphate  Dextrose 5% IVPB 15 mmol (06/07/20 0925)  . vancomycin 500 mg (06/06/20 2317)   PRN Meds:.acetaminophen, liver oil-zinc oxide, oxyCODONE-acetaminophen, prochlorperazine  Diet Orders (From admission, onward)    Start     Ordered   06/03/20 1645  Diet regular Room service appropriate? Yes; Fluid consistency: Thin  Diet effective now       Question Answer Comment  Room service appropriate? Yes   Fluid consistency: Thin      06/03/20 1644          DVT prophylaxis:  rivaroxaban (XARELTO) tablet 20 mg     Code Status: DNR  Family Communication:  Dr. Cathlean Cower [phone]  Status is: Inpatient  Remains inpatient appropriate because:Inpatient level of care appropriate due to severity of illness  Dispo: The patient is from: Home              Anticipated d/c is to: SNF              Anticipated d/c date is: 3 days              Patient currently is not medically stable to  d/c.  Consultants:  None   Procedures:  None   Microbiology  Blood cultures 1/4 bottles coag negative staph, staph epi  Antimicrobials: Vancomycin 10/15 >> Meropenem 10/15 >>   Objective: Vitals:   06/06/20 0458 06/06/20 1548 06/06/20 2151 06/07/20 0504  BP: 109/62 109/67 (!) 96/57 (!) 104/53  Pulse: (!) 109 81 93 96  Resp: 20 18 16 20   Temp: 99.3 F (37.4 C) 98.1 F (36.7 C) 97.8 F (36.6 C) 99.5 F (37.5 C)  TempSrc: Oral Oral Oral Oral  SpO2: 99% 100% 99% 100%  Weight:        Intake/Output Summary (Last 24 hours) at 06/07/2020 0945 Last data filed at 06/07/2020 8032 Gross per 24 hour  Intake 483.87 ml  Output --  Net 483.87 ml   Filed Weights   06/03/20 1456  Weight: 59 kg    Examination:  Constitutional: Very weak, frail-appearing Eyes: No scleral icterus ENMT: Moist mucous membranes Neck: normal, supple Respiratory: Diminished at the bases but overall clear, no wheezing, no crackles Cardiovascular: Regular, no murmurs appreciated, trace edema Abdomen: Soft, nontender, nondistended, bowel sounds positive  Musculoskeletal: no clubbing / cyanosis.  Skin: Cellulitic rash which has receded from her buttocks but still comprises most of her back, see below Neurologic: No focal deficits, equal strength although overall weak.  Data Reviewed: I have independently reviewed following labs and imaging studies   CBC: Recent Labs  Lab 06/03/20 1022 06/04/20 0557 06/05/20 0500 06/06/20 0442 06/07/20 0420  WBC 13.7* 13.0* 12.6* 16.8* 15.7*  NEUTROABS 11.7*  --   --   --   --   HGB 11.2* 11.3* 9.6* 9.7* 9.7*  HCT 35.4* 34.8* 31.0* 29.8* 29.4*  MCV 96.7 93.3 98.1 94.3 92.7  PLT 434* 362 354 329 166   Basic Metabolic Panel: Recent Labs  Lab 06/03/20 1022 06/03/20 1022 06/04/20 0557 06/05/20 0500 06/05/20 1900 06/06/20 0442 06/07/20 0420  NA 139  --  137 134*  --  135 132*  K 2.4*   < > 5.5* 3.0* 3.5 3.7 2.9*  CL 101  --  104 104  --  103 101  CO2  27  --  22 24  --  24 22  GLUCOSE 123*  --  105* 106*  --  111* 111*  BUN 19  --  11 12  --  10 11  CREATININE 0.93  --  0.75 0.64  --  0.65 0.50  CALCIUM 9.6  --  9.1 8.4*  --  8.4* 8.5*  MG  --   --   --   --  1.9 1.9 1.7  PHOS  --   --   --   --   --  1.1* <1.0*   < > = values in this interval not displayed.   Liver Function Tests: Recent Labs  Lab 06/03/20 1022 06/04/20 0557 06/05/20 0500 06/07/20 0420  AST 17 37 13* 71*  ALT 15 17 12  42  ALKPHOS 112 95 78 76  BILITOT 1.0 1.8* 0.6 0.3  PROT 5.8* 5.1* 4.4* 4.3*  ALBUMIN 2.5* 2.0* 1.7* 1.5*   Coagulation Profile: Recent Labs  Lab 06/03/20 1022  INR 4.3*   HbA1C: No results for input(s): HGBA1C in the last 72 hours. CBG: No results for input(s): GLUCAP in the last 168 hours.  Recent Results (from the past 240 hour(s))  Urine culture     Status: Abnormal   Collection Time: 06/03/20 10:23 AM   Specimen: In/Out Cath Urine  Result Value Ref Range Status   Specimen Description   Final    IN/OUT CATH URINE Performed at [facility] [address] [place], [place] [address]    Special Requests   Final    NONE Performed at [facility], [place] [address], [place], [place] [address]    Culture (A)  Final    >=100,000 COLONIES/mL ESCHERICHIA COLI >=100,000 COLONIES/mL ENTEROBACTER CLOACAE    Report Status 06/06/2020 FINAL  Final   Organism ID, Bacteria ESCHERICHIA COLI (A)  Final   Organism ID, Bacteria ENTEROBACTER CLOACAE (A)  Final      Susceptibility   Enterobacter cloacae - MIC*    CEFAZOLIN >=64 RESISTANT Resistant     CIPROFLOXACIN 1 SENSITIVE Sensitive     GENTAMICIN <=1 SENSITIVE Sensitive     IMIPENEM 0.5 SENSITIVE Sensitive     NITROFURANTOIN 64 INTERMEDIATE Intermediate     TRIMETH/SULFA <=20 SENSITIVE Sensitive     PIP/TAZO 16 SENSITIVE Sensitive     * >=100,000 COLONIES/mL ENTEROBACTER CLOACAE   Escherichia coli - MIC*    AMPICILLIN 4 SENSITIVE Sensitive  CEFAZOLIN <=4 SENSITIVE Sensitive     CEFTRIAXONE <=0.25 SENSITIVE Sensitive     CIPROFLOXACIN >=4 RESISTANT Resistant     GENTAMICIN >=16 RESISTANT Resistant     IMIPENEM <=0.25 SENSITIVE Sensitive     NITROFURANTOIN <=16 SENSITIVE Sensitive     TRIMETH/SULFA <=20 SENSITIVE Sensitive     AMPICILLIN/SULBACTAM <=2 SENSITIVE Sensitive     PIP/TAZO <=4 SENSITIVE Sensitive     * >=100,000 COLONIES/mL ESCHERICHIA COLI  Respiratory Panel by RT PCR (Flu A&B, Covid) - Urine, Catheterized     Status: None   Collection Time: 06/03/20 10:23 AM   Specimen: Urine, Catheterized; Nasopharyngeal  Result Value Ref Range Status   SARS Coronavirus 2 by RT PCR NEGATIVE NEGATIVE Final    Comment: (NOTE) SARS-CoV-2 target nucleic acids are NOT DETECTED.  The SARS-CoV-2 RNA is generally detectable in upper respiratoy specimens during the acute phase of infection. The lowest concentration of SARS-CoV-2 viral copies this assay can detect is 131 copies/mL. A negative result does not preclude SARS-Cov-2 infection and should not be used as the sole basis for treatment or other patient management decisions. A negative result may occur with  improper specimen collection/handling, submission of specimen other than nasopharyngeal swab, presence of viral mutation(s) within the areas targeted by this assay, and inadequate number of viral copies (<131 copies/mL). A negative result must be combined with clinical observations, patient history, and epidemiological information. The expected result is Negative.  Fact Sheet for Patients:  PinkCheek.be  Fact Sheet for Healthcare Providers:  GravelBags.it  This test is no t yet approved or cleared by the [place] FDA and  has been authorized for detection and/or diagnosis of SARS-CoV-2 by FDA under an Emergency Use Authorization (EUA). This EUA will remain  in effect (meaning this test can be used) for the  duration of the COVID-19 declaration under Section 564(b)(1) of the Act, 21 U.S.C. section 360bbb-3(b)(1), unless the authorization is terminated or revoked sooner.     Influenza A by PCR NEGATIVE NEGATIVE Final   Influenza B by PCR NEGATIVE NEGATIVE Final    Comment: (NOTE) The Xpert Xpress SARS-CoV-2/FLU/RSV assay is intended as an aid in  the diagnosis of influenza from Nasopharyngeal swab specimens and  should not be used as a sole basis for treatment. Nasal washings and  aspirates are unacceptable for Xpert Xpress SARS-CoV-2/FLU/RSV  testing.  Fact Sheet for Patients: PinkCheek.be  Fact Sheet for Healthcare Providers: GravelBags.it  This test is not yet approved or cleared by the [place] FDA and  has been authorized for detection and/or diagnosis of SARS-CoV-2 by  FDA under an Emergency Use Authorization (EUA). This EUA will remain  in effect (meaning this test can be used) for the duration of the  Covid-19 declaration under Section 564(b)(1) of the Act, 21  U.S.C. section 360bbb-3(b)(1), unless the authorization is  terminated or revoked. Performed at [facility], [place] [address], [place], [place] [address]   Blood Culture (routine x 2)     Status: Abnormal   Collection Time: 06/03/20 10:30 AM   Specimen: BLOOD LEFT ARM  Result Value Ref Range Status   Specimen Description   Final    BLOOD LEFT ARM Performed at [facility] [address] [place], [place] [address]    Special Requests   Final    BOTTLES DRAWN AEROBIC AND ANAEROBIC Blood Culture adequate volume Performed at [facility] [address] [place], [place] [address]    Culture  Setup Time   Final    ANAEROBIC BOTTLE ONLY GRAM POSITIVE COCCI CRITICAL RESULT CALLED TO, READ BACK BY AND VERIFIED WITH: PHARMD M HICKS [id number] AT 83 AM BY CM    Culture (A)  Final    STAPHYLOCOCCUS  EPIDERMIDIS THE SIGNIFICANCE OF ISOLATING THIS ORGANISM FROM A SINGLE SET OF BLOOD CULTURES WHEN MULTIPLE SETS ARE DRAWN IS UNCERTAIN. PLEASE NOTIFY THE MICROBIOLOGY DEPARTMENT WITHIN ONE WEEK IF SPECIATION AND SENSITIVITIES ARE REQUIRED. Performed at [facility], [place] [address], [place], [place] [address]    Report Status 06/06/2020 FINAL  Final  Blood Culture ID Panel (Reflexed)     Status: Abnormal   Collection Time: 06/03/20 10:30 AM  Result Value Ref Range Status   Enterococcus faecalis NOT DETECTED NOT DETECTED Final   Enterococcus Faecium NOT DETECTED NOT DETECTED Final   Listeria monocytogenes NOT DETECTED NOT DETECTED Final   Staphylococcus species DETECTED (A) NOT DETECTED Final    Comment: CRITICAL RESULT CALLED TO, READ BACK BY AND VERIFIED WITH: PHARMD M HICKS [id number] AT 855 BY CM    Staphylococcus aureus (BCID) NOT DETECTED NOT DETECTED Final   Staphylococcus epidermidis DETECTED (A) NOT DETECTED Final    Comment: Methicillin (oxacillin) resistant coagulase negative staphylococcus. Possible blood culture contaminant (unless isolated from more than one blood culture draw or clinical case suggests pathogenicity). No antibiotic treatment is indicated for blood  culture contaminants. CRITICAL RESULT CALLED TO, READ BACK BY AND VERIFIED WITH: PHARMD M HICKS [id number] AT 101 AM BY CM    Staphylococcus lugdunensis NOT DETECTED NOT DETECTED Final   Streptococcus species NOT DETECTED NOT DETECTED Final   Streptococcus agalactiae NOT DETECTED NOT DETECTED Final   Streptococcus pneumoniae NOT DETECTED NOT DETECTED Final   Streptococcus pyogenes NOT DETECTED NOT DETECTED Final   A.calcoaceticus-baumannii NOT DETECTED NOT DETECTED Final   Bacteroides fragilis NOT DETECTED NOT DETECTED Final   Enterobacterales NOT DETECTED NOT DETECTED Final   Enterobacter cloacae complex NOT DETECTED NOT DETECTED Final   Escherichia coli NOT DETECTED NOT DETECTED Final   Klebsiella aerogenes NOT  DETECTED NOT DETECTED Final   Klebsiella oxytoca NOT DETECTED NOT DETECTED Final   Klebsiella pneumoniae NOT DETECTED NOT DETECTED Final   Proteus species NOT DETECTED NOT DETECTED Final   Salmonella species NOT DETECTED NOT DETECTED Final   Serratia marcescens NOT DETECTED NOT DETECTED Final   Haemophilus influenzae NOT DETECTED NOT DETECTED Final   Neisseria meningitidis NOT DETECTED NOT DETECTED Final   Pseudomonas aeruginosa NOT DETECTED NOT DETECTED Final   Stenotrophomonas maltophilia NOT DETECTED NOT DETECTED Final   Candida albicans NOT DETECTED NOT DETECTED Final   Candida auris NOT DETECTED NOT DETECTED Final   Candida glabrata NOT DETECTED NOT DETECTED Final   Candida krusei NOT DETECTED NOT DETECTED Final   Candida parapsilosis NOT DETECTED NOT DETECTED Final   Candida tropicalis NOT DETECTED NOT DETECTED Final   Cryptococcus neoformans/gattii NOT DETECTED NOT DETECTED Final   Methicillin resistance mecA/C DETECTED (A) NOT DETECTED Final    Comment: CRITICAL RESULT CALLED TO, READ BACK BY AND VERIFIED WITH: PHARMD M HICKS [id number] AT 855 AM BY CM Performed at [facility], [address], [place], [place] [address]   Blood Culture (routine x 2)     Status: None (Preliminary result)   Collection Time: 06/03/20 10:45 AM   Specimen: BLOOD  Result Value Ref Range Status   Specimen Description   Final    BLOOD LEFT ANTECUBITAL Performed at [facility], [place] [address], [place], [place] [address]    Special Requests   Final    BOTTLES DRAWN AEROBIC AND ANAEROBIC Blood Culture adequate volume Performed at [facility] [address] [place], [place] [address]    Culture   Final    NO GROWTH 4 DAYS Performed at [facility], [place] [address] [place], [place] [address]    Report Status PENDING  Incomplete     Radiology Studies: No results found. Marzetta Board, MD, PhD [facility]  Between 7 am - 7 pm I am available, please  contact me via Amion or Securechat  Between 7 pm - 7 am I am not available, please contact night coverage MD/APP via Amion

## 2020-06-07 NOTE — Progress Notes (Signed)
CRITICAL VALUE ALERT  Critical Value:  Phosphorus <1.0  Date & Time Notied:  06/07/20@0523   Provider Notified: Yes  Orders Received/Actions taken: Yes

## 2020-06-07 NOTE — Progress Notes (Addendum)
Physical Therapy Treatment Patient Details Name: Valerie [name] MRN: [id number] DOB: [birth year] Today's Date: 06/07/2020    History of Present Illness Pt is 74 yo female with hx including metastatic lung CA, adenocarcinoma with brain and neck mets, hx of craniotomy for tumor resection, whole brain radiation, R thyroidectomy with radical neck dissection, CHF, MRSA, and UTIs.  Pt presented to hospital with L sided flank/back pain as well as FTT and progressive weakness. Pt admitted with sepsis due to back cellulitis and possible UTI.    PT Comments    Pt lying in bed upon my arrival. RN stated pt is weaker and in more pain today than over the weekend, mechanical lift was required for bed to recliner transfer.  Noted pt unable to stand with OT earlier today. Encouraged pt to attempt rolling to side to minimize pressure to her painful back wounds. She declined. She agreed to bed level leg exercises. Pt has pain with minimal activity. Pain is limiting progress.     Follow Up Recommendations  Other (comment);Supervision/Assistance - 24 hour (Pt appears to have 24 hr care at home.  Would recommend max HH services and increased personal care aides to reduce caregiver burden vs LTC)     Equipment Recommendations  None recommended by PT (has DME)    Recommendations for Other Services       Precautions / Restrictions Precautions Precautions: Fall Precaution Comments: wounds - entire back excoriated/superficial wounds/cellulitis; non ambulatory for 1 year per pt Restrictions Weight Bearing Restrictions: No    Mobility  Bed Mobility               General bed mobility comments: pt lying in bed, I encouraged pt to consider rolling to side due to painful excoriated skin on back, she declined;  RN stated she used lift to transfer pt to recliner earlier today, pt unable to stand with OT earlier today, pain limiting activity tolerance, pt agreed to LE exercises in bed  Transfers                  General transfer comment: Pt was repositioned in recliner with LEs down and back of chair upright. Attempted to work with pt on scooting anteriorly in chair with use of draw sheet, however pt moaning with each attempt, and pushing herself back. Pt able to use UEs on arms of chair with cuing to pull her upper body forward briefly while moaning in pain/effort and with Moderate assist, but unable to maintain upright sitting more than 1 second. Attempted to position pt with Denna Haggard, however pt unable to maintain upright sitting needed to place UEs on bars, and was assessed to be too lethargic and in too much pain to attempt supported stand today.  Ambulation/Gait                 Stairs             Wheelchair Mobility    Modified Rankin (Stroke Patients Only)       Balance Overall balance assessment: Needs assistance   Sitting balance-Leahy Scale: Poor Sitting balance - Comments: kyphotic but looses balance posteriorly - encouraged reaching forward to hands on knees then to bars of Denna Haggard, but pt only able to maintain upright sitting balance for short period of time, and requiring mod A Postural control: Posterior lean;Left lateral lean     Standing balance comment: Unable to stand today.  Cognition Arousal/Alertness: Lethargic Behavior During Therapy: Flat affect;Anxious Overall Cognitive Status: No family/caregiver present to determine baseline cognitive functioning                                 General Comments: Followed simple commands with increased time.  Fatigues quickly.      Exercises General Exercises - Lower Extremity Ankle Circles/Pumps: AROM;Both;Supine;AAROM;20 reps Quad Sets: AROM;Both;Supine;Other (comment) (3 reps, with 1 second hold, pt unable to hold longer than 1 second 2* pain) Heel Slides: AAROM;Both;10 reps;Supine Hip ABduction/ADduction: AAROM;Both;10 reps;Supine Other  Exercises Other Exercises: Pt positioned in recliner to promote neck extension. Pt instructed to raise head then push head back into pillow for 3 second cout x2, then 5 second count x5 with head retraction exercises. Pt declined more reps stating, "I need to be done now." Pt was encouraged to continue these exercises on her own ad lib. Attempted to assist with BUE AAROM, but pt pulling her arms back into her body tightly and stating all movement hurt too much.    General Comments        Pertinent Vitals/Pain Pain Assessment: Faces Faces Pain Scale: Hurts whole lot Pain Location: back, groin with movement Pain Descriptors / Indicators: Grimacing;Guarding Pain Intervention(s): Limited activity within patient's tolerance;Monitored during session;Premedicated before session;Repositioned    Home Living                      Prior Function            PT Goals (current goals can now be found in the care plan section) Acute Rehab PT Goals Patient Stated Goal: none stated PT Goal Formulation: With patient Time For Goal Achievement: 06/18/20 Potential to Achieve Goals: Poor Progress towards PT goals: Not progressing toward goals - comment (decline in activity tolerance)    Frequency    Min 2X/week      PT Plan      Co-evaluation              AM-PAC PT "6 Clicks" Mobility   Outcome Measure  Help needed turning from your back to your side while in a flat bed without using bedrails?: A Lot Help needed moving from lying on your back to sitting on the side of a flat bed without using bedrails?: Total Help needed moving to and from a bed to a chair (including a wheelchair)?: Total Help needed standing up from a chair using your arms (e.g., wheelchair or bedside chair)?: Total Help needed to walk in hospital room?: Total Help needed climbing 3-5 steps with a railing? : Total 6 Click Score: 7    End of Session   Activity Tolerance: Patient limited by fatigue;Patient  limited by pain Patient left: with call bell/phone within reach;in bed;with bed alarm set Nurse Communication: Mobility status;Need for lift equipment PT Visit Diagnosis: Muscle weakness (generalized) (M62.81);Unsteadiness on feet (R26.81)     Time: 4765-4650 PT Time Calculation (min) (ACUTE ONLY): 16 min  Charges:  $Therapeutic exercise: 8-22 mins                    Blondell Reveal Kistler PT 06/07/2020  Acute Rehabilitation Services Pager [phone] Office [phone]

## 2020-06-08 DIAGNOSIS — A419 Sepsis, unspecified organism: Secondary | ICD-10-CM | POA: Diagnosis not present

## 2020-06-08 LAB — CBC WITH DIFFERENTIAL/PLATELET
Abs Immature Granulocytes: 0.13 10*3/uL — ABNORMAL HIGH (ref 0.00–0.07)
Basophils Absolute: 0 10*3/uL (ref 0.0–0.1)
Basophils Relative: 0 %
Eosinophils Absolute: 0.5 10*3/uL (ref 0.0–0.5)
Eosinophils Relative: 4 %
HCT: 25.4 % — ABNORMAL LOW (ref 36.0–46.0)
Hemoglobin: 8.3 g/dL — ABNORMAL LOW (ref 12.0–15.0)
Immature Granulocytes: 1 %
Lymphocytes Relative: 6 %
Lymphs Abs: 0.8 10*3/uL (ref 0.7–4.0)
MCH: 30.3 pg (ref 26.0–34.0)
MCHC: 32.7 g/dL (ref 30.0–36.0)
MCV: 92.7 fL (ref 80.0–100.0)
Monocytes Absolute: 0.7 10*3/uL (ref 0.1–1.0)
Monocytes Relative: 5 %
Neutro Abs: 10.4 10*3/uL — ABNORMAL HIGH (ref 1.7–7.7)
Neutrophils Relative %: 84 %
Platelets: 335 10*3/uL (ref 150–400)
RBC: 2.74 MIL/uL — ABNORMAL LOW (ref 3.87–5.11)
RDW: 17.2 % — ABNORMAL HIGH (ref 11.5–15.5)
WBC: 12.5 10*3/uL — ABNORMAL HIGH (ref 4.0–10.5)
nRBC: 0 % (ref 0.0–0.2)

## 2020-06-08 LAB — COMPREHENSIVE METABOLIC PANEL
ALT: 171 U/L — ABNORMAL HIGH (ref 0–44)
AST: 256 U/L — ABNORMAL HIGH (ref 15–41)
Albumin: 1.4 g/dL — ABNORMAL LOW (ref 3.5–5.0)
Alkaline Phosphatase: 73 U/L (ref 38–126)
Anion gap: 6 (ref 5–15)
BUN: 10 mg/dL (ref 8–23)
CO2: 24 mmol/L (ref 22–32)
Calcium: 7.9 mg/dL — ABNORMAL LOW (ref 8.9–10.3)
Chloride: 103 mmol/L (ref 98–111)
Creatinine, Ser: 0.49 mg/dL (ref 0.44–1.00)
GFR, Estimated: >60 mL/min (ref >60)
Glucose, Bld: 100 mg/dL — ABNORMAL HIGH (ref 70–99)
Potassium: 3.6 mmol/L (ref 3.5–5.1)
Sodium: 133 mmol/L — ABNORMAL LOW (ref 135–145)
Total Bilirubin: 0.6 mg/dL (ref 0.3–1.2)
Total Protein: 4.1 g/dL — ABNORMAL LOW (ref 6.5–8.1)

## 2020-06-08 LAB — CULTURE, BLOOD (ROUTINE X 2)
Culture: NO GROWTH
Special Requests: ADEQUATE

## 2020-06-08 LAB — MAGNESIUM: Magnesium: 2.2 mg/dL (ref 1.7–2.4)

## 2020-06-08 LAB — PHOSPHORUS: Phosphorus: 2.3 mg/dL — ABNORMAL LOW (ref 2.5–4.6)

## 2020-06-08 MED ORDER — CEPHALEXIN 500 MG PO CAPS
500.0000 mg | ORAL_CAPSULE | Freq: Four times a day (QID) | ORAL | Status: AC
  Administered 2020-06-08 – 2020-06-11 (×12): 500 mg via ORAL
  Filled 2020-06-08 (×12): qty 1

## 2020-06-08 MED ORDER — OXYCODONE HCL 5 MG PO TABS
5.0000 mg | ORAL_TABLET | ORAL | Status: DC | PRN
  Administered 2020-06-08 – 2020-06-13 (×9): 5 mg via ORAL
  Filled 2020-06-08 (×9): qty 1

## 2020-06-08 MED ORDER — POTASSIUM CHLORIDE 10 MEQ/100ML IV SOLN
10.0000 meq | INTRAVENOUS | Status: AC
  Administered 2020-06-08 (×2): 10 meq via INTRAVENOUS
  Filled 2020-06-08 (×2): qty 100

## 2020-06-08 NOTE — TOC Progression Note (Signed)
Transition of Care [facility]) - Progression Note    Patient Details  Name: Valerie [name] MRN: [id number] Date of Birth: [birth year]  Transition [facility]) CM/SW Contact  Purcell Mouton, RN Phone Number: 06/08/2020, 10:36 AM  Clinical Narrative:    A call was made to pt's husband [name] [phone] with no answer. VM was left for Mr. [name] to return CM call.    Expected Discharge Plan:  (SNF v Home) Barriers to Discharge: SNF Pending bed offer  Expected Discharge Plan and Services Expected Discharge Plan:  (SNF v Home)   Discharge Planning Services: CM Consult Post Acute Care Choice: [facility] Living arrangements for the past 2 months: Single Family Home                                       Social Determinants of Health (SDOH) Interventions    Readmission Risk Interventions Readmission Risk Prevention Plan 02/18/2020  Transportation Screening Complete  PCP or Specialist Appt within 5-7 Days Complete  Home Care Screening Patient refused  Medication Review (RN CM) Complete  Some recent data might be hidden

## 2020-06-08 NOTE — Progress Notes (Signed)
[facility] for Infectious Disease  Date of Admission:  06/03/2020      Reason for Consult: rash                                 Referring Provider: Cruzita Lederer    Lines:  peripheral  Abx: 10/19-c cephalexin (tolerated oral challenge)  10/15-19 vanc 10/15-19 meropenem                                                     Assessment: Valerie [name] is a 74 y.o. 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 Id consulted for continued elevated wbc count    Chronic pyuria with assymptomatic bacteriuria. No clinical syndrome pyelo/cystitis. No need for abx bcx 1 of 2 set CoNS contaminant  Previous mrsa bacteremia appears treated   Rash I am concerned about tarceva related known side effect. Clinically doesn't appear to be cellulitis. It is currently being hold. I would observe off abx for now. She is high risk for cdiff and abx related toxicity.   I reviewed literature on tarceva related rash. At this time I have placed emoliant and anti-inflammatory ointment for the rash. There is intetrigo desquamation which I think could benefit from good skin care/nystatin powder  While there is a small area that appear symmetric in the thigh appearing more erythematous, again exam and hx is not c/w cellulitis, and I would manage as above  Suspect leukocytosis stress demargination and medication effect.   Reference: K[place]ohara. Erlotinib-related skin toxicities tx strategies. JAAD. Sept, 2013 (vol 69 (3)).   ---------- Improved leukocytosis Improved erythema with good wound care/[place] cream  As I retrospectively evaluated her, will finish cellulitic component tx for  7 days on 10/21 with cephalexin.   Patient's cephalexin allergy can be removed   Plan: 1. Finish cephalexin on 10/21 2. Ok to dc from id standpoint 3. If rash continues in setting of restarting tarceva, would consider discussion with derm / onc for further management  4.  Id will sign off  Active Problems:   Pressure injury of skin   Cellulitis   Rash and nonspecific skin eruption   Metastatic malignant neoplasm (HCC)   Failure to thrive in adult   Asymptomatic bacteriuria   Scheduled Meds: . atorvastatin  80 mg Oral QHS  . cephALEXin  500 mg Oral Q6H  . cholecalciferol  50 mcg Oral Daily  . clopidogrel  75 mg Oral Q breakfast  . donepezil  10 mg Oral QHS  . feeding supplement  1 Container Oral BID BM  . Gerhardt's butt cream   Topical BID  . magnesium oxide  400 mg Oral Daily  . metoprolol succinate  25 mg Oral Daily  . multivitamin with minerals  1 tablet Oral QPM  . nystatin   Topical BID  . phosphorus  500 mg Oral BID  . protein supplement  1 packet Oral q12n4p  . rivaroxaban  20 mg Oral QPM  . saccharomyces boulardii  250 mg  Oral BID  . triamcinolone ointment   Topical BID  . vitamin B-12  1,000 mcg Oral QPM  . vitamin C  250 mg Oral QPM   Continuous Infusions: PRN Meds:.acetaminophen, diphenhydrAMINE, EPINEPHrine, liver oil-zinc oxide, oxyCODONE-acetaminophen, prochlorperazine   SUBJECTIVE: The rash is less itchy Subjectively overall well beign felt about the same No f/c No n/v/diarrhea Very weak  Review of Systems: ROS Negative 11 point ros unless mentioned above  No Active Allergies  OBJECTIVE: Vitals:   06/07/20 0504 06/07/20 1508 06/07/20 2019 06/08/20 0445  BP: (!) 104/53 95/65 (!) 95/53 (!) 99/51  Pulse: 96 82 87 80  Resp: 20 18 19 16   Temp: 99.5 F (37.5 C) 97.7 F (36.5 C) 98.3 F (36.8 C) 97.6 F (36.4 C)  TempSrc: Oral Oral Oral Oral  SpO2: 100% 98% 99% 100%  Weight:       Body mass index is 20.36 kg/m.  Physical Exam No  obvious distress, chronically ill appearing Psych: Alert/oriented Heent: generalized alopecia; atraumatic otherwise; oropharynx clear Neck supple rrr no mrg abd s/nt Ext trace bilateral le edema to thighs Skin decreased erythema proximal bilateral thigh; no erythema else where, stable linear maceration and dry skin back/thighs and below breast fold/groins Neuro 4/5 strength generalized weakness  Lab Results Lab Results  Component Value Date   WBC 12.5 (H) 06/08/2020   HGB 8.3 (L) 06/08/2020   HCT 25.4 (L) 06/08/2020   MCV 92.7 06/08/2020   PLT 335 06/08/2020    Lab Results  Component Value Date   CREATININE 0.49 06/08/2020   BUN 10 06/08/2020   NA 133 (L) 06/08/2020   K 3.6 06/08/2020   CL 103 06/08/2020   CO2 24 06/08/2020    Lab Results  Component Value Date   ALT 171 (H) 06/08/2020   AST 256 (H) 06/08/2020   ALKPHOS 73 06/08/2020   BILITOT 0.6 06/08/2020     Microbiology: Recent Results (from the past 240 hour(s))  Urine culture     Status: Abnormal   Collection Time: 06/03/20 10:23 AM   Specimen: In/Out Cath Urine  Result Value Ref Range Status   Specimen Description   Final    IN/OUT CATH URINE Performed at [facility], [place] [address], [place], [place] [address]    Special Requests   Final    NONE Performed at [facility], [place] [address] [place], [place] [address]    Culture (A)  Final    >=100,000 COLONIES/mL ESCHERICHIA COLI >=100,000 COLONIES/mL ENTEROBACTER CLOACAE    Report Status 06/06/2020 FINAL  Final   Organism ID, Bacteria ESCHERICHIA COLI (A)  Final   Organism ID, Bacteria ENTEROBACTER CLOACAE (A)  Final      Susceptibility   Enterobacter cloacae - MIC*    CEFAZOLIN >=64 RESISTANT Resistant     CIPROFLOXACIN 1 SENSITIVE Sensitive     GENTAMICIN <=1 SENSITIVE Sensitive     IMIPENEM 0.5 SENSITIVE Sensitive     NITROFURANTOIN 64 INTERMEDIATE Intermediate     TRIMETH/SULFA <=20 SENSITIVE Sensitive      PIP/TAZO 16 SENSITIVE Sensitive     * >=100,000 COLONIES/mL ENTEROBACTER CLOACAE   Escherichia coli - MIC*    AMPICILLIN 4 SENSITIVE Sensitive     CEFAZOLIN <=4 SENSITIVE Sensitive     CEFTRIAXONE <=0.25 SENSITIVE Sensitive     CIPROFLOXACIN >=4 RESISTANT Resistant     GENTAMICIN >=16 RESISTANT Resistant     IMIPENEM <=0.25 SENSITIVE Sensitive     NITROFURANTOIN <=16 SENSITIVE Sensitive  TRIMETH/SULFA <=20 SENSITIVE Sensitive     AMPICILLIN/SULBACTAM <=2 SENSITIVE Sensitive     PIP/TAZO <=4 SENSITIVE Sensitive     * >=100,000 COLONIES/mL ESCHERICHIA COLI  Respiratory Panel by RT PCR (Flu A&B, Covid) - Urine, Catheterized     Status: None   Collection Time: 06/03/20 10:23 AM   Specimen: Urine, Catheterized; Nasopharyngeal  Result Value Ref Range Status   SARS Coronavirus 2 by RT PCR NEGATIVE NEGATIVE Final    Comment: (NOTE) SARS-CoV-2 target nucleic acids are NOT DETECTED.  The SARS-CoV-2 RNA is generally detectable in upper respiratoy specimens during the acute phase of infection. The lowest concentration of SARS-CoV-2 viral copies this assay can detect is 131 copies/mL. A negative result does not preclude SARS-Cov-2 infection and should not be used as the sole basis for treatment or other patient management decisions. A negative result may occur with  improper specimen collection/handling, submission of specimen other than nasopharyngeal swab, presence of viral mutation(s) within the areas targeted by this assay, and inadequate number of viral copies (<131 copies/mL). A negative result must be combined with clinical observations, patient history, and epidemiological information. The expected result is Negative.  Fact Sheet for Patients:  PinkCheek.be  Fact Sheet for Healthcare Providers:  GravelBags.it  This test is no t yet approved or cleared by the [place] FDA and  has been authorized for detection  and/or diagnosis of SARS-CoV-2 by FDA under an Emergency Use Authorization (EUA). This EUA will remain  in effect (meaning this test can be used) for the duration of the COVID-19 declaration under Section 564(b)(1) of the Act, 21 U.S.C. section 360bbb-3(b)(1), unless the authorization is terminated or revoked sooner.     Influenza A by PCR NEGATIVE NEGATIVE Final   Influenza B by PCR NEGATIVE NEGATIVE Final    Comment: (NOTE) The Xpert Xpress SARS-CoV-2/FLU/RSV assay is intended as an aid in  the diagnosis of influenza from Nasopharyngeal swab specimens and  should not be used as a sole basis for treatment. Nasal washings and  aspirates are unacceptable for Xpert Xpress SARS-CoV-2/FLU/RSV  testing.  Fact Sheet for Patients: PinkCheek.be  Fact Sheet for Healthcare Providers: GravelBags.it  This test is not yet approved or cleared by the [place] FDA and  has been authorized for detection and/or diagnosis of SARS-CoV-2 by  FDA under an Emergency Use Authorization (EUA). This EUA will remain  in effect (meaning this test can be used) for the duration of the  Covid-19 declaration under Section 564(b)(1) of the Act, 21  U.S.C. section 360bbb-3(b)(1), unless the authorization is  terminated or revoked. Performed at [facility], [place] [address] [place], [place] [address]   Blood Culture (routine x 2)     Status: Abnormal   Collection Time: 06/03/20 10:30 AM   Specimen: BLOOD LEFT ARM  Result Value Ref Range Status   Specimen Description   Final    BLOOD LEFT ARM Performed at [place] [address], [place], [place] [address]    Special Requests   Final    BOTTLES DRAWN AEROBIC AND ANAEROBIC Blood Culture adequate volume Performed at [place] [address] [place], [place] [address]    Culture  Setup Time   Final    ANAEROBIC BOTTLE ONLY GRAM  POSITIVE COCCI CRITICAL RESULT CALLED TO, READ BACK BY AND VERIFIED WITH: PHARMD M HICKS [id number] AT 47 AM BY CM    Culture (A)  Final    STAPHYLOCOCCUS EPIDERMIDIS THE  SIGNIFICANCE OF ISOLATING THIS ORGANISM FROM A SINGLE SET OF BLOOD CULTURES WHEN MULTIPLE SETS ARE DRAWN IS UNCERTAIN. PLEASE NOTIFY THE MICROBIOLOGY DEPARTMENT WITHIN ONE WEEK IF SPECIATION AND SENSITIVITIES ARE REQUIRED. Performed at [facility], [place] [address], [place], [place] [address]    Report Status 06/06/2020 FINAL  Final  Blood Culture ID Panel (Reflexed)     Status: Abnormal   Collection Time: 06/03/20 10:30 AM  Result Value Ref Range Status   Enterococcus faecalis NOT DETECTED NOT DETECTED Final   Enterococcus Faecium NOT DETECTED NOT DETECTED Final   Listeria monocytogenes NOT DETECTED NOT DETECTED Final   Staphylococcus species DETECTED (A) NOT DETECTED Final    Comment: CRITICAL RESULT CALLED TO, READ BACK BY AND VERIFIED WITH: PHARMD M HICKS [id number] AT 855 BY CM    Staphylococcus aureus (BCID) NOT DETECTED NOT DETECTED Final   Staphylococcus epidermidis DETECTED (A) NOT DETECTED Final    Comment: Methicillin (oxacillin) resistant coagulase negative staphylococcus. Possible blood culture contaminant (unless isolated from more than one blood culture draw or clinical case suggests pathogenicity). No antibiotic treatment is indicated for blood  culture contaminants. CRITICAL RESULT CALLED TO, READ BACK BY AND VERIFIED WITH: PHARMD M HICKS [id number] AT 5 AM BY CM    Staphylococcus lugdunensis NOT DETECTED NOT DETECTED Final   Streptococcus species NOT DETECTED NOT DETECTED Final   Streptococcus agalactiae NOT DETECTED NOT DETECTED Final   Streptococcus pneumoniae NOT DETECTED NOT DETECTED Final   Streptococcus pyogenes NOT DETECTED NOT DETECTED Final   A.calcoaceticus-baumannii NOT DETECTED NOT DETECTED Final   Bacteroides fragilis NOT DETECTED NOT DETECTED Final   Enterobacterales NOT DETECTED NOT  DETECTED Final   Enterobacter cloacae complex NOT DETECTED NOT DETECTED Final   Escherichia coli NOT DETECTED NOT DETECTED Final   Klebsiella aerogenes NOT DETECTED NOT DETECTED Final   Klebsiella oxytoca NOT DETECTED NOT DETECTED Final   Klebsiella pneumoniae NOT DETECTED NOT DETECTED Final   Proteus species NOT DETECTED NOT DETECTED Final   Salmonella species NOT DETECTED NOT DETECTED Final   Serratia marcescens NOT DETECTED NOT DETECTED Final   Haemophilus influenzae NOT DETECTED NOT DETECTED Final   Neisseria meningitidis NOT DETECTED NOT DETECTED Final   Pseudomonas aeruginosa NOT DETECTED NOT DETECTED Final   Stenotrophomonas maltophilia NOT DETECTED NOT DETECTED Final   Candida albicans NOT DETECTED NOT DETECTED Final   Candida auris NOT DETECTED NOT DETECTED Final   Candida glabrata NOT DETECTED NOT DETECTED Final   Candida krusei NOT DETECTED NOT DETECTED Final   Candida parapsilosis NOT DETECTED NOT DETECTED Final   Candida tropicalis NOT DETECTED NOT DETECTED Final   Cryptococcus neoformans/gattii NOT DETECTED NOT DETECTED Final   Methicillin resistance mecA/C DETECTED (A) NOT DETECTED Final    Comment: CRITICAL RESULT CALLED TO, READ BACK BY AND VERIFIED WITH: PHARMD M HICKS [id number] AT 855 AM BY CM Performed at [facility], [address] [place], [place] [address]   Blood Culture (routine x 2)     Status: None   Collection Time: 06/03/20 10:45 AM   Specimen: BLOOD  Result Value Ref Range Status   Specimen Description   Final    BLOOD LEFT ANTECUBITAL Performed at [facility], [place] [address], [place], [place] [address]    Special Requests   Final    BOTTLES DRAWN AEROBIC AND ANAEROBIC Blood Culture adequate volume Performed at [place] [address], [place], [place] [address]    Culture  Final    NO GROWTH 5 DAYS Performed at [facility], [place] [address] [place], [place] [address]    Report Status 06/08/2020 FINAL   Final      Jabier Mutton, MD [facility] [place] [phone] pager    06/08/2020, 10:45 AM

## 2020-06-08 NOTE — Progress Notes (Addendum)
PROGRESS NOTE    Valerie [name]  WYO:[id number] DOB: 01-10-1946 DOA: 06/03/2020 PCP: [name], MD   Chief Complaint  Patient presents with  . Weakness  . Wound Check    Brief Narrative:  74 year old female with history of metastatic non-small cell lung cancer, adenocarcinoma, with brain and neck mets, with history of craniotomy for tumor resection, whole brain radiation, gamma knife, right thyroidectomy with radical neck dissection, currently on Tarceva started in July 7741, systolic CHF, recent MRSA bacteremia, recurrent UTIs including ESBL organisms came into the hospital with left-sided flank and back pain as well as failure to thrive, progressive weakness and immobility.  She has been having poor p.o. intake, nausea, vomiting, diarrhea for the past few weeks.  Assessment & Plan:   Active Problems:   Pressure injury of skin   Cellulitis   Rash and nonspecific skin eruption   Metastatic malignant neoplasm (HCC)   Failure to thrive in adult   Asymptomatic bacteriuria  Sepsis 2/2 Cellulitis  Tarceva Related Skin Rash: Vancomycin 10/15-10/18.  Meropenem 10/15-10/18.  Keflex 10/19 - present. ID c/s, appreciate recommendations -> concern for tarceva related rash, recommended completion of abx on 10/21.  If rash continues, discuss with derm/oncology.  5 days triamcinolone.  Eucerin.   1/2 blood cx with staph epi, likely contaminant  Hypokalemia  Hypophosphatemia  Hypomagnesemia Improved, follow  Asymptomatic Bacteruria E. Coli and enterobacter on culture, she's asymptomatic, follow  Nausea  Vomiting  Diarrhea  Poor Appetite  Deconditioning  Failure to Thrive: Concern for related to tarceva Follow therapy, encourage PO intake OOBTC, mobilize Diarrhea has improved off tarceva  Hx DVT Continue xarelto  HFrEF Most recent EF 25-30% with global hypokinesis, grade 1 diastolic dysfunction -Continu statin, Plavix, metoprolol -Recent cath done in June showed  minimal CAD, 50% circumflex disease, it was felt that her cardiomyopathy was likely secondary toTagrissowhich has been discontinued at that time. -Appears euvolemic, received IV fluids in the ED and again 10/16.  Avoid further IV fluids, encourage p.o. intake  History of non-small cell lung cancer with mets to the brain and neck -Hold Tarceva per pharmacy in the setting of active infection.  Of note, during her prior hospitalization with MRSA bacteremia it has been continued. -I wonder whether Tarceva is playing Dashun Borre major role in her rash, poor immune system, failure to thrive, poor appetite, diarrhea, nausea.  Patient's daughter is inclining towards stopping it.  -Discussed with Dr. Julien Nordmann on 10/18, recommends holding Tarceva right now and resuming it at [facility] lower dose on discharge, discussed with husband and patient as well, they are agreeable  Recent MRSA bacteremia -Per prior notes, patient did not want Myshawn Chir[place] PICC line and received IV vancomycin while hospitalized for Geraldine Tesar week and was discharged on linezolid.   Continue current antibiotics, ID to see as well  History of dementia -Mild, alert and oriented x4, continue home medications  Paroxysmal Denard Tuminello. Fib -Continue Xarelto, metoprolol  Hyperlipidemia -Continue statin  Normocytic anemia -No bleeding, likely in the setting of chronic illness  Elevated LFTs Acute hepatitis panel Hold APAP and statin for now Follow   DVT prophylaxis: xarelto Code Status: DNR Family Communication: none at bedside - 10/20 husband Disposition:   Status is: Inpatient  Remains inpatient appropriate because:Inpatient level of care appropriate due to severity of illness   Dispo: The patient is from: Home              Anticipated d/c is to: Home  Anticipated d/c date is: 1 day              Patient currently is not medically stable to d/c.  Consultants:   ID  oncology   Procedures:   none  Antimicrobials:  Anti-infectives (From  admission, onward)   Start     Dose/Rate Route Frequency Ordered Stop   06/08/20 1200  cephALEXin (KEFLEX) capsule 500 mg        500 mg Oral Every 6 hours 06/08/20 0800 06/11/20 1159   06/07/20 1545  cephALEXin (KEFLEX) capsule 250 mg        250 mg Oral  Once 06/07/20 1534 06/07/20 1612   06/07/20 1415  cephALEXin (KEFLEX) capsule 250 mg  Status:  Discontinued        250 mg Oral Once 06/07/20 1220 06/07/20 1534   06/07/20 1400  ceFAZolin (ANCEF) IVPB 1 g/50 mL premix  Status:  Discontinued        1 g 100 mL/hr over 30 Minutes Intravenous Every 8 hours 06/07/20 1202 06/07/20 1213   06/07/20 1315  cephALEXin (KEFLEX) 125 MG/5ML suspension 125 mg        125 mg Oral Once 06/07/20 1220 06/07/20 1409   06/07/20 1215  cephALEXin (KEFLEX) capsule 250 mg  Status:  Discontinued        250 mg Oral Once 06/07/20 1122 06/07/20 1154   06/05/20 2000  meropenem (MERREM) 1 g in sodium chloride 0.9 % 100 mL IVPB  Status:  Discontinued        1 g 200 mL/hr over 30 Minutes Intravenous Every 8 hours 06/05/20 1239 06/07/20 1010   06/04/20 0000  meropenem (MERREM) 1 g in sodium chloride 0.9 % 100 mL IVPB  Status:  Discontinued        1 g 200 mL/hr over 30 Minutes Intravenous Every 12 hours 06/03/20 1545 06/05/20 1239   06/03/20 2200  vancomycin (VANCOREADY) IVPB 500 mg/100 mL  Status:  Discontinued        500 mg 100 mL/hr over 60 Minutes Intravenous Every 12 hours 06/03/20 1545 06/07/20 1010   06/03/20 1730  vancomycin (VANCOCIN) IVPB 1000 mg/200 mL premix  Status:  Discontinued        1,000 mg 200 mL/hr over 60 Minutes Intravenous  Once 06/03/20 1644 06/03/20 1656   06/03/20 1730  meropenem (MERREM) 1 g in sodium chloride 0.9 % 100 mL IVPB  Status:  Discontinued        1 g 200 mL/hr over 30 Minutes Intravenous  Once 06/03/20 1644 06/03/20 1656   06/03/20 1215  vancomycin (VANCOCIN) IVPB 1000 mg/200 mL premix        1,000 mg 200 mL/hr over 60 Minutes Intravenous  Once 06/03/20 1200 06/03/20 1334   06/03/20  1200  meropenem (MERREM) 1 g in sodium chloride 0.9 % 100 mL IVPB        1 g 200 mL/hr over 30 Minutes Intravenous  Once 06/03/20 1159 06/03/20 1410         Subjective: No new complaints  Objective: Vitals:   06/07/20 1508 06/07/20 2019 06/08/20 0445 06/08/20 1400  BP: 95/65 (!) 95/53 (!) 99/51 (!) 106/57  Pulse: 82 87 80 93  Resp: 18 19 16 16   Temp: 97.7 F (36.5 C) 98.3 F (36.8 C) 97.6 F (36.4 C) 98.5 F (36.9 C)  TempSrc: Oral Oral Oral Oral  SpO2: 98% 99% 100% 99%  Weight:        Intake/Output Summary (Last 24 hours)  at 06/08/2020 1741 Last data filed at 06/08/2020 0600 Gross per 24 hour  Intake 480 ml  Output 800 ml  Net -320 ml   Filed Weights   06/03/20 1456  Weight: 59 kg    Examination:  General exam: Appears calm and comfortable  Respiratory system: Clear to auscultation. Respiratory effort normal. Cardiovascular system: S1 & S2 heard, RRR. No JVD, murmurs, rubs, gallops or clicks. No pedal edema. Gastrointestinal system: Abdomen is nondistended, soft and nontender Central nervous system: Alert and oriented. No focal neurological deficits. Extremities: no LEE Skin: back with erythema and areas of desquamation Psychiatry: Judgement and insight appear normal. Mood & affect appropriate.     Data Reviewed: I have personally reviewed following labs and imaging studies  CBC: Recent Labs  Lab 06/03/20 1022 06/03/20 1022 06/04/20 0557 06/05/20 0500 06/06/20 0442 06/07/20 0420 06/08/20 0412  WBC 13.7*   < > 13.0* 12.6* 16.8* 15.7* 12.5*  NEUTROABS 11.7*  --   --   --   --   --  10.4*  HGB 11.2*   < > 11.3* 9.6* 9.7* 9.7* 8.3*  HCT 35.4*   < > 34.8* 31.0* 29.8* 29.4* 25.4*  MCV 96.7   < > 93.3 98.1 94.3 92.7 92.7  PLT 434*   < > 362 354 329 340 335   < > = values in this interval not displayed.    Basic Metabolic Panel: Recent Labs  Lab 06/04/20 0557 06/04/20 0557 06/05/20 0500 06/05/20 1900 06/06/20 0442 06/07/20 0420 06/07/20 2042  06/08/20 0412  NA 137  --  134*  --  135 132*  --  133*  K 5.5*   < > 3.0* 3.5 3.7 2.9*  --  3.6  CL 104  --  104  --  103 101  --  103  CO2 22  --  24  --  24 22  --  24  GLUCOSE 105*  --  106*  --  111* 111*  --  100*  BUN 11  --  12  --  10 11  --  10  CREATININE 0.75  --  0.64  --  0.65 0.50  --  0.49  CALCIUM 9.1  --  8.4*  --  8.4* 8.5*  --  7.9*  MG  --   --   --  1.9 1.9 1.7  --  2.2  PHOS  --   --   --   --  1.1* <1.0* 1.8* 2.3*   < > = values in this interval not displayed.    GFR: Estimated Creatinine Clearance: 57.5 mL/min (by C-G formula based on SCr of 0.49 mg/dL).  Liver Function Tests: Recent Labs  Lab 06/03/20 1022 06/04/20 0557 06/05/20 0500 06/07/20 0420 06/08/20 0412  AST 17 37 13* 71* 256*  ALT 15 17 12  42 171*  ALKPHOS 112 95 78 76 73  BILITOT 1.0 1.8* 0.6 0.3 0.6  PROT 5.8* 5.1* 4.4* 4.3* 4.1*  ALBUMIN 2.5* 2.0* 1.7* 1.5* 1.4*    CBG: No results for input(s): GLUCAP in the last 168 hours.   Recent Results (from the past 240 hour(s))  Urine culture     Status: Abnormal   Collection Time: 06/03/20 10:23 AM   Specimen: In/Out Cath Urine  Result Value Ref Range Status   Specimen Description   Final    IN/OUT CATH URINE Performed at [facility] [address] [place], [place] [address]    Special Requests  Final    NONE Performed at [facility], [place] [address] [place], [place] [address]    Culture (Cevin Rubinstein)  Final    >=100,000 COLONIES/mL ESCHERICHIA COLI >=100,000 COLONIES/mL ENTEROBACTER CLOACAE    Report Status 06/06/2020 FINAL  Final   Organism ID, Bacteria ESCHERICHIA COLI (Treonna Klee)  Final   Organism ID, Bacteria ENTEROBACTER CLOACAE (Riaan Toledo)  Final      Susceptibility   Enterobacter cloacae - MIC*    CEFAZOLIN >=64 RESISTANT Resistant     CIPROFLOXACIN 1 SENSITIVE Sensitive     GENTAMICIN <=1 SENSITIVE Sensitive     IMIPENEM 0.5 SENSITIVE Sensitive     NITROFURANTOIN 64 INTERMEDIATE Intermediate      TRIMETH/SULFA <=20 SENSITIVE Sensitive     PIP/TAZO 16 SENSITIVE Sensitive     * >=100,000 COLONIES/mL ENTEROBACTER CLOACAE   Escherichia coli - MIC*    AMPICILLIN 4 SENSITIVE Sensitive     CEFAZOLIN <=4 SENSITIVE Sensitive     CEFTRIAXONE <=0.25 SENSITIVE Sensitive     CIPROFLOXACIN >=4 RESISTANT Resistant     GENTAMICIN >=16 RESISTANT Resistant     IMIPENEM <=0.25 SENSITIVE Sensitive     NITROFURANTOIN <=16 SENSITIVE Sensitive     TRIMETH/SULFA <=20 SENSITIVE Sensitive     AMPICILLIN/SULBACTAM <=2 SENSITIVE Sensitive     PIP/TAZO <=4 SENSITIVE Sensitive     * >=100,000 COLONIES/mL ESCHERICHIA COLI  Respiratory Panel by RT PCR (Flu Faria Casella&B, Covid) - Urine, Catheterized     Status: None   Collection Time: 06/03/20 10:23 AM   Specimen: Urine, Catheterized; Nasopharyngeal  Result Value Ref Range Status   SARS Coronavirus 2 by RT PCR NEGATIVE NEGATIVE Final    Comment: (NOTE) SARS-CoV-2 target nucleic acids are NOT DETECTED.  The SARS-CoV-2 RNA is generally detectable in upper respiratoy specimens during the acute phase of infection. The lowest concentration of SARS-CoV-2 viral copies this assay can detect is 131 copies/mL. Letisia Schwalb negative result does not preclude SARS-Cov-2 infection and should not be used as the sole basis for treatment or other patient management decisions. Trinisha Paget negative result may occur with  improper specimen collection/handling, submission of specimen other than nasopharyngeal swab, presence of viral mutation(s) within the areas targeted by this assay, and inadequate number of viral copies (<131 copies/mL). Jovani Colquhoun negative result must be combined with clinical observations, patient history, and epidemiological information. The expected result is Negative.  Fact Sheet for Patients:  PinkCheek.be  Fact Sheet for Healthcare Providers:  GravelBags.it  This test is no t yet approved or cleared by the [place] FDA  and  has been authorized for detection and/or diagnosis of SARS-CoV-2 by FDA under an Emergency Use Authorization (EUA). This EUA will remain  in effect (meaning this test can be used) for the duration of the COVID-19 declaration under Section 564(b)(1) of the Act, 21 U.S.C. section 360bbb-3(b)(1), unless the authorization is terminated or revoked sooner.     Influenza Keonna Raether by PCR NEGATIVE NEGATIVE Final   Influenza B by PCR NEGATIVE NEGATIVE Final    Comment: (NOTE) The Xpert Xpress SARS-CoV-2/FLU/RSV assay is intended as an aid in  the diagnosis of influenza from Nasopharyngeal swab specimens and  should not be used as Braddock Servellon sole basis for treatment. Nasal washings and  aspirates are unacceptable for Xpert Xpress SARS-CoV-2/FLU/RSV  testing.  Fact Sheet for Patients: PinkCheek.be  Fact Sheet for Healthcare Providers: GravelBags.it  This test is not yet approved or cleared by the [place] FDA and  has been authorized for detection and/or diagnosis  of SARS-CoV-2 by  FDA under an Emergency Use Authorization (EUA). This EUA will remain  in effect (meaning this test can be used) for the duration of the  Covid-19 declaration under Section 564(b)(1) of the Act, 21  U.S.C. section 360bbb-3(b)(1), unless the authorization is  terminated or revoked. Performed at [facility], [place] [address], [place], [place] [address]   Blood Culture (routine x 2)     Status: Abnormal   Collection Time: 06/03/20 10:30 AM   Specimen: BLOOD LEFT ARM  Result Value Ref Range Status   Specimen Description   Final    BLOOD LEFT ARM Performed at [facility] [address] [place], [place] [address]    Special Requests   Final    BOTTLES DRAWN AEROBIC AND ANAEROBIC Blood Culture adequate volume Performed at [facility] [address], [place], [place] [address]    Culture  Setup Time    Final    ANAEROBIC BOTTLE ONLY GRAM POSITIVE COCCI CRITICAL RESULT CALLED TO, READ BACK BY AND VERIFIED WITH: PHARMD M HICKS [id number] AT 79 AM BY CM    Culture (Cicero Noy)  Final    STAPHYLOCOCCUS EPIDERMIDIS THE SIGNIFICANCE OF ISOLATING THIS ORGANISM FROM Vessie Olmsted SINGLE SET OF BLOOD CULTURES WHEN MULTIPLE SETS ARE DRAWN IS UNCERTAIN. PLEASE NOTIFY THE MICROBIOLOGY DEPARTMENT WITHIN ONE WEEK IF SPECIATION AND SENSITIVITIES ARE REQUIRED. Performed at [facility], [place] [address], [place], [place] [address]    Report Status 06/06/2020 FINAL  Final  Blood Culture ID Panel (Reflexed)     Status: Abnormal   Collection Time: 06/03/20 10:30 AM  Result Value Ref Range Status   Enterococcus faecalis NOT DETECTED NOT DETECTED Final   Enterococcus Faecium NOT DETECTED NOT DETECTED Final   Listeria monocytogenes NOT DETECTED NOT DETECTED Final   Staphylococcus species DETECTED (Monesha Monreal) NOT DETECTED Final    Comment: CRITICAL RESULT CALLED TO, READ BACK BY AND VERIFIED WITH: PHARMD M HICKS [id number] AT 855 BY CM    Staphylococcus aureus (BCID) NOT DETECTED NOT DETECTED Final   Staphylococcus epidermidis DETECTED (Melvinia Ashby) NOT DETECTED Final    Comment: Methicillin (oxacillin) resistant coagulase negative staphylococcus. Possible blood culture contaminant (unless isolated from more than one blood culture draw or clinical case suggests pathogenicity). No antibiotic treatment is indicated for blood  culture contaminants. CRITICAL RESULT CALLED TO, READ BACK BY AND VERIFIED WITH: PHARMD M HICKS [id number] AT 40 AM BY CM    Staphylococcus lugdunensis NOT DETECTED NOT DETECTED Final   Streptococcus species NOT DETECTED NOT DETECTED Final   Streptococcus agalactiae NOT DETECTED NOT DETECTED Final   Streptococcus pneumoniae NOT DETECTED NOT DETECTED Final   Streptococcus pyogenes NOT DETECTED NOT DETECTED Final   Tiannah Greenly.calcoaceticus-baumannii NOT DETECTED NOT DETECTED Final   Bacteroides fragilis NOT DETECTED NOT DETECTED Final    Enterobacterales NOT DETECTED NOT DETECTED Final   Enterobacter cloacae complex NOT DETECTED NOT DETECTED Final   Escherichia coli NOT DETECTED NOT DETECTED Final   Klebsiella aerogenes NOT DETECTED NOT DETECTED Final   Klebsiella oxytoca NOT DETECTED NOT DETECTED Final   Klebsiella pneumoniae NOT DETECTED NOT DETECTED Final   Proteus species NOT DETECTED NOT DETECTED Final   Salmonella species NOT DETECTED NOT DETECTED Final   Serratia marcescens NOT DETECTED NOT DETECTED Final   Haemophilus influenzae NOT DETECTED NOT DETECTED Final   Neisseria meningitidis NOT DETECTED NOT DETECTED Final   Pseudomonas aeruginosa NOT DETECTED NOT DETECTED Final   Stenotrophomonas maltophilia NOT DETECTED  NOT DETECTED Final   Candida albicans NOT DETECTED NOT DETECTED Final   Candida auris NOT DETECTED NOT DETECTED Final   Candida glabrata NOT DETECTED NOT DETECTED Final   Candida krusei NOT DETECTED NOT DETECTED Final   Candida parapsilosis NOT DETECTED NOT DETECTED Final   Candida tropicalis NOT DETECTED NOT DETECTED Final   Cryptococcus neoformans/gattii NOT DETECTED NOT DETECTED Final   Methicillin resistance mecA/C DETECTED (Elke Holtry) NOT DETECTED Final    Comment: CRITICAL RESULT CALLED TO, READ BACK BY AND VERIFIED WITH: PHARMD M HICKS [id number] AT 855 AM BY CM Performed at [facility], [address] [place], [place] [address]   Blood Culture (routine x 2)     Status: None   Collection Time: 06/03/20 10:45 AM   Specimen: BLOOD  Result Value Ref Range Status   Specimen Description   Final    BLOOD LEFT ANTECUBITAL Performed at [facility], [place] [address], [place], [place] [address]    Special Requests   Final    BOTTLES DRAWN AEROBIC AND ANAEROBIC Blood Culture adequate volume Performed at [facility] [address], [place], [place] [address]    Culture   Final    NO GROWTH 5 DAYS Performed at [facility], [place] [address], [place], [place] [address]     Report Status 06/08/2020 FINAL  Final         Radiology Studies: No results found.      Scheduled Meds: . atorvastatin  80 mg Oral QHS  . cephALEXin  500 mg Oral Q6H  . cholecalciferol  50 mcg Oral Daily  . clopidogrel  75 mg Oral Q breakfast  . donepezil  10 mg Oral QHS  . feeding supplement  1 Container Oral BID BM  . Gerhardt's butt cream   Topical BID  . magnesium oxide  400 mg Oral Daily  . metoprolol succinate  25 mg Oral Daily  . multivitamin with minerals  1 tablet Oral QPM  . nystatin   Topical BID  . phosphorus  500 mg Oral BID  . protein supplement  1 packet Oral q12n4p  . rivaroxaban  20 mg Oral QPM  . saccharomyces boulardii  250 mg Oral BID  . triamcinolone ointment   Topical BID  . vitamin B-12  1,000 mcg Oral QPM  . vitamin C  250 mg Oral QPM   Continuous Infusions:   LOS: 5 days    Time spent: over 30 min    Fayrene Helper, MD [facility]   To contact the attending provider between 7A-7P or the covering provider during after hours 7P-7A, please log into the web site [email or web address] and access using universal [place] password for that web site. If you do not have the password, please call the hospital operator.  06/08/2020, 5:41 PM

## 2020-06-09 ENCOUNTER — Ambulatory Visit: Payer: Medicare PPO | Admitting: Physical Therapy

## 2020-06-09 DIAGNOSIS — L03312 Cellulitis of back [any part except buttock]: Secondary | ICD-10-CM | POA: Diagnosis not present

## 2020-06-09 LAB — COMPREHENSIVE METABOLIC PANEL
ALT: 146 U/L — ABNORMAL HIGH (ref 0–44)
AST: 117 U/L — ABNORMAL HIGH (ref 15–41)
Albumin: 1.5 g/dL — ABNORMAL LOW (ref 3.5–5.0)
Alkaline Phosphatase: 73 U/L (ref 38–126)
Anion gap: 7 (ref 5–15)
BUN: 16 mg/dL (ref 8–23)
CO2: 24 mmol/L (ref 22–32)
Calcium: 7.7 mg/dL — ABNORMAL LOW (ref 8.9–10.3)
Chloride: 102 mmol/L (ref 98–111)
Creatinine, Ser: 0.62 mg/dL (ref 0.44–1.00)
GFR, Estimated: >60 mL/min (ref >60)
Glucose, Bld: 102 mg/dL — ABNORMAL HIGH (ref 70–99)
Potassium: 3.2 mmol/L — ABNORMAL LOW (ref 3.5–5.1)
Sodium: 133 mmol/L — ABNORMAL LOW (ref 135–145)
Total Bilirubin: 0.4 mg/dL (ref 0.3–1.2)
Total Protein: 4.2 g/dL — ABNORMAL LOW (ref 6.5–8.1)

## 2020-06-09 LAB — CBC WITH DIFFERENTIAL/PLATELET
Abs Immature Granulocytes: 0.11 10*3/uL — ABNORMAL HIGH (ref 0.00–0.07)
Basophils Absolute: 0 10*3/uL (ref 0.0–0.1)
Basophils Relative: 0 %
Eosinophils Absolute: 0.5 10*3/uL (ref 0.0–0.5)
Eosinophils Relative: 5 %
HCT: 26.2 % — ABNORMAL LOW (ref 36.0–46.0)
Hemoglobin: 8.4 g/dL — ABNORMAL LOW (ref 12.0–15.0)
Immature Granulocytes: 1 %
Lymphocytes Relative: 7 %
Lymphs Abs: 0.8 10*3/uL (ref 0.7–4.0)
MCH: 30.2 pg (ref 26.0–34.0)
MCHC: 32.1 g/dL (ref 30.0–36.0)
MCV: 94.2 fL (ref 80.0–100.0)
Monocytes Absolute: 0.7 10*3/uL (ref 0.1–1.0)
Monocytes Relative: 6 %
Neutro Abs: 9.2 10*3/uL — ABNORMAL HIGH (ref 1.7–7.7)
Neutrophils Relative %: 81 %
Platelets: 351 10*3/uL (ref 150–400)
RBC: 2.78 MIL/uL — ABNORMAL LOW (ref 3.87–5.11)
RDW: 17.2 % — ABNORMAL HIGH (ref 11.5–15.5)
WBC: 11.3 10*3/uL — ABNORMAL HIGH (ref 4.0–10.5)
nRBC: 0 % (ref 0.0–0.2)

## 2020-06-09 LAB — PHOSPHORUS: Phosphorus: 2.1 mg/dL — ABNORMAL LOW (ref 2.5–4.6)

## 2020-06-09 LAB — MAGNESIUM: Magnesium: 2 mg/dL (ref 1.7–2.4)

## 2020-06-09 MED ORDER — HYDROCERIN EX CREA
TOPICAL_CREAM | Freq: Two times a day (BID) | CUTANEOUS | Status: DC
  Filled 2020-06-09: qty 113

## 2020-06-09 MED ORDER — POTASSIUM CHLORIDE CRYS ER 20 MEQ PO TBCR
40.0000 meq | EXTENDED_RELEASE_TABLET | ORAL | Status: AC
  Administered 2020-06-09 (×2): 40 meq via ORAL
  Filled 2020-06-09 (×2): qty 2

## 2020-06-09 NOTE — Progress Notes (Signed)
PROGRESS NOTE    Valerie [name]  TKP:[id number] DOB: [birth year] DOA: 06/03/2020 PCP: [name], MD   Chief Complaint  Patient presents with  . Weakness  . Wound Check    Brief Narrative:  74 year old female with history of metastatic non-small cell lung cancer, adenocarcinoma, with brain and neck mets, with history of craniotomy for tumor resection, whole brain radiation, gamma knife, right thyroidectomy with radical neck dissection, currently on Tarceva started in July 5170, systolic CHF, recent MRSA bacteremia, recurrent UTIs including ESBL organisms came into the hospital with left-sided flank and back pain as well as failure to thrive, progressive weakness and immobility.  She has been having poor p.o. intake, nausea, vomiting, diarrhea for the past few weeks.  Assessment & Plan:   Active Problems:   Pressure injury of skin   Cellulitis   Rash and nonspecific skin eruption   Metastatic malignant neoplasm (HCC)   Failure to thrive in adult   Asymptomatic bacteriuria  Sepsis 2/2 Cellulitis  Tarceva Related Skin Rash: Vancomycin 10/15-10/18.  Meropenem 10/15-10/18.  Keflex 10/19 - present. ID c/s, appreciate recommendations -> concern for tarceva related rash, recommended completion of abx on 10/21.  If rash continues, discuss with derm/oncology.  5 days triamcinolone.  Eucerin.   1/2 blood cx with staph epi, likely contaminant  Hypokalemia  Hypophosphatemia  Hypomagnesemia replace, follow  Asymptomatic Bacteruria E. Coli and enterobacter on culture, she's asymptomatic, follow  Nausea  Vomiting  Diarrhea  Poor Appetite  Deconditioning  Failure to Thrive: Concern for related to tarceva Follow therapy, encourage PO intake OOBTC, mobilize Diarrhea has improved off tarceva  Hx DVT Continue xarelto  HFrEF Most recent EF 25-30% with global hypokinesis, grade 1 diastolic dysfunction -Continu statin, Plavix, metoprolol -Recent cath done in June showed  minimal CAD, 50% circumflex disease, it was felt that her cardiomyopathy was likely secondary toTagrissowhich has been discontinued at that time. -Appears euvolemic, received IV fluids in the ED and again 10/16.  Avoid further IV fluids, encourage p.o. intake  History of non-small cell lung cancer with mets to the brain and neck -Hold Tarceva per pharmacy in the setting of active infection.  Of note, during her prior hospitalization with MRSA bacteremia it has been continued. -I wonder whether Tarceva is playing Delmus Warwick major role in her rash, poor immune system, failure to thrive, poor appetite, diarrhea, nausea.  Patient's daughter is inclining towards stopping it.  -Discussed with Dr. Julien Nordmann on 10/18, recommends holding Tarceva right now and resuming it at [facility] Mundorf lower dose on discharge, discussed with husband and patient as well, they are agreeable  Recent MRSA bacteremia -Per prior notes, patient did not want Suhaib Guzzo PICC line and received IV vancomycin while hospitalized for Aubra Pappalardo week and was discharged on linezolid.   Continue current antibiotics, ID to see as well  History of dementia -Mild, alert and oriented x4, continue home medications  Paroxysmal Doralene Glanz. Fib -Continue Xarelto, metoprolol  Hyperlipidemia -Continue statin  Normocytic anemia -No bleeding, likely in the setting of chronic illness  Elevated LFTs Acute hepatitis panel -> Hep B Core Ab IgM positive, will need to follow up additional w/u Hold APAP and statin for now Follow   DVT prophylaxis: xarelto Code Status: DNR Family Communication: none at bedside - 10/21 husband Disposition:  Pending decision regarding SNF vs home  Status is: Inpatient  Remains inpatient appropriate because:Inpatient level of care appropriate due to severity of illness   Dispo: The patient is from: Home  Anticipated d/c is to: Home              Anticipated d/c date is: 1 day              Patient currently is not medically stable to  d/c.  Consultants:   ID  oncology   Procedures:   none  Antimicrobials:  Anti-infectives (From admission, onward)   Start     Dose/Rate Route Frequency Ordered Stop   06/08/20 1200  cephALEXin (KEFLEX) capsule 500 mg        500 mg Oral Every 6 hours 06/08/20 0800 06/11/20 1159   06/07/20 1545  cephALEXin (KEFLEX) capsule 250 mg        250 mg Oral  Once 06/07/20 1534 06/07/20 1612   06/07/20 1415  cephALEXin (KEFLEX) capsule 250 mg  Status:  Discontinued        250 mg Oral Once 06/07/20 1220 06/07/20 1534   06/07/20 1400  ceFAZolin (ANCEF) IVPB 1 g/50 mL premix  Status:  Discontinued        1 g 100 mL/hr over 30 Minutes Intravenous Every 8 hours 06/07/20 1202 06/07/20 1213   06/07/20 1315  cephALEXin (KEFLEX) 125 MG/5ML suspension 125 mg        125 mg Oral Once 06/07/20 1220 06/07/20 1409   06/07/20 1215  cephALEXin (KEFLEX) capsule 250 mg  Status:  Discontinued        250 mg Oral Once 06/07/20 1122 06/07/20 1154   06/05/20 2000  meropenem (MERREM) 1 g in sodium chloride 0.9 % 100 mL IVPB  Status:  Discontinued        1 g 200 mL/hr over 30 Minutes Intravenous Every 8 hours 06/05/20 1239 06/07/20 1010   06/04/20 0000  meropenem (MERREM) 1 g in sodium chloride 0.9 % 100 mL IVPB  Status:  Discontinued        1 g 200 mL/hr over 30 Minutes Intravenous Every 12 hours 06/03/20 1545 06/05/20 1239   06/03/20 2200  vancomycin (VANCOREADY) IVPB 500 mg/100 mL  Status:  Discontinued        500 mg 100 mL/hr over 60 Minutes Intravenous Every 12 hours 06/03/20 1545 06/07/20 1010   06/03/20 1730  vancomycin (VANCOCIN) IVPB 1000 mg/200 mL premix  Status:  Discontinued        1,000 mg 200 mL/hr over 60 Minutes Intravenous  Once 06/03/20 1644 06/03/20 1656   06/03/20 1730  meropenem (MERREM) 1 g in sodium chloride 0.9 % 100 mL IVPB  Status:  Discontinued        1 g 200 mL/hr over 30 Minutes Intravenous  Once 06/03/20 1644 06/03/20 1656   06/03/20 1215  vancomycin (VANCOCIN) IVPB 1000 mg/200 mL  premix        1,000 mg 200 mL/hr over 60 Minutes Intravenous  Once 06/03/20 1200 06/03/20 1334   06/03/20 1200  meropenem (MERREM) 1 g in sodium chloride 0.9 % 100 mL IVPB        1 g 200 mL/hr over 30 Minutes Intravenous  Once 06/03/20 1159 06/03/20 1410         Subjective: No new complaints  Objective: Vitals:   06/08/20 1400 06/08/20 2101 06/09/20 0501 06/09/20 1503  BP: (!) 106/57 111/60 122/61 101/63  Pulse: 93 (!) 106 89 (!) 103  Resp: 16 20  15   Temp: 98.5 F (36.9 C) 97.8 F (36.6 C) 98.1 F (36.7 C) 97.7 F (36.5 C)  TempSrc: Oral Oral Oral   SpO2:  99% 100% 99% 100%  Weight:        Intake/Output Summary (Last 24 hours) at 06/09/2020 1818 Last data filed at 06/09/2020 1730 Gross per 24 hour  Intake 840 ml  Output 1100 ml  Net -260 ml   Filed Weights   06/03/20 1456  Weight: 59 kg    Examination:  General: No acute distress. Cardiovascular: Heart sounds show Tykera Skates regular rate, and rhythm. Lungs: Clear to auscultation bilaterally Abdomen: Soft, nontender, nondistended Neurological: Alert and oriented 3. Moves all extremities 4. Cranial nerves II through XII grossly intact. Skin: erythema, areas of desquamation Extremities: No clubbing or cyanosis. No edema.     Data Reviewed: I have personally reviewed following labs and imaging studies  CBC: Recent Labs  Lab 06/03/20 1022 06/04/20 0557 06/05/20 0500 06/06/20 0442 06/07/20 0420 06/08/20 0412 06/09/20 0355  WBC 13.7*   < > 12.6* 16.8* 15.7* 12.5* 11.3*  NEUTROABS 11.7*  --   --   --   --  10.4* 9.2*  HGB 11.2*   < > 9.6* 9.7* 9.7* 8.3* 8.4*  HCT 35.4*   < > 31.0* 29.8* 29.4* 25.4* 26.2*  MCV 96.7   < > 98.1 94.3 92.7 92.7 94.2  PLT 434*   < > 354 329 340 335 351   < > = values in this interval not displayed.    Basic Metabolic Panel: Recent Labs  Lab 06/05/20 0500 06/05/20 0500 06/05/20 1900 06/06/20 0442 06/07/20 0420 06/07/20 2042 06/08/20 0412 06/09/20 0355  NA 134*  --   --   135 132*  --  133* 133*  K 3.0*   < > 3.5 3.7 2.9*  --  3.6 3.2*  CL 104  --   --  103 101  --  103 102  CO2 24  --   --  24 22  --  24 24  GLUCOSE 106*  --   --  111* 111*  --  100* 102*  BUN 12  --   --  10 11  --  10 16  CREATININE 0.64  --   --  0.65 0.50  --  0.49 0.62  CALCIUM 8.4*  --   --  8.4* 8.5*  --  7.9* 7.7*  MG  --   --  1.9 1.9 1.7  --  2.2 2.0  PHOS  --   --   --  1.1* <1.0* 1.8* 2.3* 2.1*   < > = values in this interval not displayed.    GFR: Estimated Creatinine Clearance: 57.5 mL/min (by C-G formula based on SCr of 0.62 mg/dL).  Liver Function Tests: Recent Labs  Lab 06/04/20 0557 06/05/20 0500 06/07/20 0420 06/08/20 0412 06/09/20 0355  AST 37 13* 71* 256* 117*  ALT 17 12 42 171* 146*  ALKPHOS 95 78 76 73 73  BILITOT 1.8* 0.6 0.3 0.6 0.4  PROT 5.1* 4.4* 4.3* 4.1* 4.2*  ALBUMIN 2.0* 1.7* 1.5* 1.4* 1.5*    CBG: No results for input(s): GLUCAP in the last 168 hours.   Recent Results (from the past 240 hour(s))  Urine culture     Status: Abnormal   Collection Time: 06/03/20 10:23 AM   Specimen: In/Out Cath Urine  Result Value Ref Range Status   Specimen Description   Final    IN/OUT CATH URINE Performed at [facility] [address] [place], [place] [address]    Special Requests   Final    NONE Performed at [facility]  [place] [address] [place], [place] [address]    Culture (Chloe Flis)  Final    >=100,000 COLONIES/mL ESCHERICHIA COLI >=100,000 COLONIES/mL ENTEROBACTER CLOACAE    Report Status 06/06/2020 FINAL  Final   Organism ID, Bacteria ESCHERICHIA COLI (Jasleen Riepe)  Final   Organism ID, Bacteria ENTEROBACTER CLOACAE (Tery Hoeger)  Final      Susceptibility   Enterobacter cloacae - MIC*    CEFAZOLIN >=64 RESISTANT Resistant     CIPROFLOXACIN 1 SENSITIVE Sensitive     GENTAMICIN <=1 SENSITIVE Sensitive     IMIPENEM 0.5 SENSITIVE Sensitive     NITROFURANTOIN 64 INTERMEDIATE Intermediate     TRIMETH/SULFA <=20 SENSITIVE  Sensitive     PIP/TAZO 16 SENSITIVE Sensitive     * >=100,000 COLONIES/mL ENTEROBACTER CLOACAE   Escherichia coli - MIC*    AMPICILLIN 4 SENSITIVE Sensitive     CEFAZOLIN <=4 SENSITIVE Sensitive     CEFTRIAXONE <=0.25 SENSITIVE Sensitive     CIPROFLOXACIN >=4 RESISTANT Resistant     GENTAMICIN >=16 RESISTANT Resistant     IMIPENEM <=0.25 SENSITIVE Sensitive     NITROFURANTOIN <=16 SENSITIVE Sensitive     TRIMETH/SULFA <=20 SENSITIVE Sensitive     AMPICILLIN/SULBACTAM <=2 SENSITIVE Sensitive     PIP/TAZO <=4 SENSITIVE Sensitive     * >=100,000 COLONIES/mL ESCHERICHIA COLI  Respiratory Panel by RT PCR (Flu Geovonni Meyerhoff&B, Covid) - Urine, Catheterized     Status: None   Collection Time: 06/03/20 10:23 AM   Specimen: Urine, Catheterized; Nasopharyngeal  Result Value Ref Range Status   SARS Coronavirus 2 by RT PCR NEGATIVE NEGATIVE Final    Comment: (NOTE) SARS-CoV-2 target nucleic acids are NOT DETECTED.  The SARS-CoV-2 RNA is generally detectable in upper respiratoy specimens during the acute phase of infection. The lowest concentration of SARS-CoV-2 viral copies this assay can detect is 131 copies/mL. Angelgabriel Willmore negative result does not preclude SARS-Cov-2 infection and should not be used as the sole basis for treatment or other patient management decisions. Nicholi Ghuman negative result may occur with  improper specimen collection/handling, submission of specimen other than nasopharyngeal swab, presence of viral mutation(s) within the areas targeted by this assay, and inadequate number of viral copies (<131 copies/mL). Jireh Elmore negative result must be combined with clinical observations, patient history, and epidemiological information. The expected result is Negative.  Fact Sheet for Patients:  PinkCheek.be  Fact Sheet for Healthcare Providers:  GravelBags.it  This test is no t yet approved or cleared by the [place] FDA and  has been authorized for  detection and/or diagnosis of SARS-CoV-2 by FDA under an Emergency Use Authorization (EUA). This EUA will remain  in effect (meaning this test can be used) for the duration of the COVID-19 declaration under Section 564(b)(1) of the Act, 21 U.S.C. section 360bbb-3(b)(1), unless the authorization is terminated or revoked sooner.     Influenza Brodrick Curran by PCR NEGATIVE NEGATIVE Final   Influenza B by PCR NEGATIVE NEGATIVE Final    Comment: (NOTE) The Xpert Xpress SARS-CoV-2/FLU/RSV assay is intended as an aid in  the diagnosis of influenza from Nasopharyngeal swab specimens and  should not be used as Akilah Cureton sole basis for treatment. Nasal washings and  aspirates are unacceptable for Xpert Xpress SARS-CoV-2/FLU/RSV  testing.  Fact Sheet for Patients: PinkCheek.be  Fact Sheet for Healthcare Providers: GravelBags.it  This test is not yet approved or cleared by the [place] FDA and  has been authorized for detection and/or diagnosis of SARS-CoV-2 by  FDA under an Emergency  Use Authorization (EUA). This EUA will remain  in effect (meaning this test can be used) for the duration of the  Covid-19 declaration under Section 564(b)(1) of the Act, 21  U.S.C. section 360bbb-3(b)(1), unless the authorization is  terminated or revoked. Performed at [facility], [place] [address], [place], [place] [address]   Blood Culture (routine x 2)     Status: Abnormal   Collection Time: 06/03/20 10:30 AM   Specimen: BLOOD LEFT ARM  Result Value Ref Range Status   Specimen Description   Final    BLOOD LEFT ARM Performed at [facility] [address] [place], [place] [address]    Special Requests   Final    BOTTLES DRAWN AEROBIC AND ANAEROBIC Blood Culture adequate volume Performed at [facility] [address], [place], [place] [address]    Culture  Setup Time   Final    ANAEROBIC BOTTLE ONLY  GRAM POSITIVE COCCI CRITICAL RESULT CALLED TO, READ BACK BY AND VERIFIED WITH: PHARMD M HICKS [id number] AT 68 AM BY CM    Culture (Latrell Reitan)  Final    STAPHYLOCOCCUS EPIDERMIDIS THE SIGNIFICANCE OF ISOLATING THIS ORGANISM FROM Jerika Wales SINGLE SET OF BLOOD CULTURES WHEN MULTIPLE SETS ARE DRAWN IS UNCERTAIN. PLEASE NOTIFY THE MICROBIOLOGY DEPARTMENT WITHIN ONE WEEK IF SPECIATION AND SENSITIVITIES ARE REQUIRED. Performed at [facility], [place] [address] [place], [place] [address]    Report Status 06/06/2020 FINAL  Final  Blood Culture ID Panel (Reflexed)     Status: Abnormal   Collection Time: 06/03/20 10:30 AM  Result Value Ref Range Status   Enterococcus faecalis NOT DETECTED NOT DETECTED Final   Enterococcus Faecium NOT DETECTED NOT DETECTED Final   Listeria monocytogenes NOT DETECTED NOT DETECTED Final   Staphylococcus species DETECTED (Kenora Spayd) NOT DETECTED Final    Comment: CRITICAL RESULT CALLED TO, READ BACK BY AND VERIFIED WITH: PHARMD M HICKS [id number] AT 855 BY CM    Staphylococcus aureus (BCID) NOT DETECTED NOT DETECTED Final   Staphylococcus epidermidis DETECTED (Jennilee Demarco) NOT DETECTED Final    Comment: Methicillin (oxacillin) resistant coagulase negative staphylococcus. Possible blood culture contaminant (unless isolated from more than one blood culture draw or clinical case suggests pathogenicity). No antibiotic treatment is indicated for blood  culture contaminants. CRITICAL RESULT CALLED TO, READ BACK BY AND VERIFIED WITH: PHARMD M HICKS [id number] AT 83 AM BY CM    Staphylococcus lugdunensis NOT DETECTED NOT DETECTED Final   Streptococcus species NOT DETECTED NOT DETECTED Final   Streptococcus agalactiae NOT DETECTED NOT DETECTED Final   Streptococcus pneumoniae NOT DETECTED NOT DETECTED Final   Streptococcus pyogenes NOT DETECTED NOT DETECTED Final   Lorrayne Ismael.calcoaceticus-baumannii NOT DETECTED NOT DETECTED Final   Bacteroides fragilis NOT DETECTED NOT DETECTED Final   Enterobacterales NOT DETECTED NOT  DETECTED Final   Enterobacter cloacae complex NOT DETECTED NOT DETECTED Final   Escherichia coli NOT DETECTED NOT DETECTED Final   Klebsiella aerogenes NOT DETECTED NOT DETECTED Final   Klebsiella oxytoca NOT DETECTED NOT DETECTED Final   Klebsiella pneumoniae NOT DETECTED NOT DETECTED Final   Proteus species NOT DETECTED NOT DETECTED Final   Salmonella species NOT DETECTED NOT DETECTED Final   Serratia marcescens NOT DETECTED NOT DETECTED Final   Haemophilus influenzae NOT DETECTED NOT DETECTED Final   Neisseria meningitidis NOT DETECTED NOT DETECTED Final   Pseudomonas aeruginosa NOT DETECTED NOT DETECTED Final   Stenotrophomonas maltophilia NOT DETECTED NOT DETECTED Final   Candida albicans NOT  DETECTED NOT DETECTED Final   Candida auris NOT DETECTED NOT DETECTED Final   Candida glabrata NOT DETECTED NOT DETECTED Final   Candida krusei NOT DETECTED NOT DETECTED Final   Candida parapsilosis NOT DETECTED NOT DETECTED Final   Candida tropicalis NOT DETECTED NOT DETECTED Final   Cryptococcus neoformans/gattii NOT DETECTED NOT DETECTED Final   Methicillin resistance mecA/C DETECTED (Brasen Bundren) NOT DETECTED Final    Comment: CRITICAL RESULT CALLED TO, READ BACK BY AND VERIFIED WITH: PHARMD M HICKS [id number] AT 855 AM BY CM Performed at [facility], [address] [place], [place] [address]   Blood Culture (routine x 2)     Status: None   Collection Time: 06/03/20 10:45 AM   Specimen: BLOOD  Result Value Ref Range Status   Specimen Description   Final    BLOOD LEFT ANTECUBITAL Performed at [facility], [place] [address], [place], [place] [address]    Special Requests   Final    BOTTLES DRAWN AEROBIC AND ANAEROBIC Blood Culture adequate volume Performed at [facility] [address] [place], [place] [address]    Culture   Final    NO GROWTH 5 DAYS Performed at [facility], [place] [address], [place], [place] [address]    Report Status 06/08/2020 FINAL   Final         Radiology Studies: No results found.      Scheduled Meds: . cephALEXin  500 mg Oral Q6H  . cholecalciferol  50 mcg Oral Daily  . clopidogrel  75 mg Oral Q breakfast  . donepezil  10 mg Oral QHS  . feeding supplement  1 Container Oral BID BM  . Gerhardt's butt cream   Topical BID  . [START ON 06/12/2020] hydrocerin   Topical BID  . magnesium oxide  400 mg Oral Daily  . metoprolol succinate  25 mg Oral Daily  . multivitamin with minerals  1 tablet Oral QPM  . nystatin   Topical BID  . protein supplement  1 packet Oral q12n4p  . rivaroxaban  20 mg Oral QPM  . saccharomyces boulardii  250 mg Oral BID  . triamcinolone ointment   Topical BID  . vitamin B-12  1,000 mcg Oral QPM  . vitamin C  250 mg Oral QPM   Continuous Infusions:   LOS: 6 days    Time spent: over 30 min    Fayrene Helper, MD [facility]   To contact the attending provider between 7A-7P or the covering provider during after hours 7P-7A, please log into the web site [email or web address] and access using universal [place] password for that web site. If you do not have the password, please call the hospital operator.  06/09/2020, 6:18 PM

## 2020-06-10 DIAGNOSIS — L03312 Cellulitis of back [any part except buttock]: Secondary | ICD-10-CM | POA: Diagnosis not present

## 2020-06-10 LAB — CBC WITH DIFFERENTIAL/PLATELET
Abs Immature Granulocytes: 0.23 10*3/uL — ABNORMAL HIGH (ref 0.00–0.07)
Basophils Absolute: 0 10*3/uL (ref 0.0–0.1)
Basophils Relative: 0 %
Eosinophils Absolute: 0.5 10*3/uL (ref 0.0–0.5)
Eosinophils Relative: 4 %
HCT: 24.9 % — ABNORMAL LOW (ref 36.0–46.0)
Hemoglobin: 8 g/dL — ABNORMAL LOW (ref 12.0–15.0)
Immature Granulocytes: 2 %
Lymphocytes Relative: 10 %
Lymphs Abs: 1 10*3/uL (ref 0.7–4.0)
MCH: 29.7 pg (ref 26.0–34.0)
MCHC: 32.1 g/dL (ref 30.0–36.0)
MCV: 92.6 fL (ref 80.0–100.0)
Monocytes Absolute: 0.8 10*3/uL (ref 0.1–1.0)
Monocytes Relative: 8 %
Neutro Abs: 8 10*3/uL — ABNORMAL HIGH (ref 1.7–7.7)
Neutrophils Relative %: 76 %
Platelets: 345 10*3/uL (ref 150–400)
RBC: 2.69 MIL/uL — ABNORMAL LOW (ref 3.87–5.11)
RDW: 17.2 % — ABNORMAL HIGH (ref 11.5–15.5)
WBC: 10.6 10*3/uL — ABNORMAL HIGH (ref 4.0–10.5)
nRBC: 0 % (ref 0.0–0.2)

## 2020-06-10 LAB — COMPREHENSIVE METABOLIC PANEL
ALT: 123 U/L — ABNORMAL HIGH (ref 0–44)
AST: 73 U/L — ABNORMAL HIGH (ref 15–41)
Albumin: 1.6 g/dL — ABNORMAL LOW (ref 3.5–5.0)
Alkaline Phosphatase: 69 U/L (ref 38–126)
Anion gap: 7 (ref 5–15)
BUN: 16 mg/dL (ref 8–23)
CO2: 24 mmol/L (ref 22–32)
Calcium: 8.1 mg/dL — ABNORMAL LOW (ref 8.9–10.3)
Chloride: 103 mmol/L (ref 98–111)
Creatinine, Ser: 0.51 mg/dL (ref 0.44–1.00)
GFR, Estimated: >60 mL/min (ref >60)
Glucose, Bld: 107 mg/dL — ABNORMAL HIGH (ref 70–99)
Potassium: 3.2 mmol/L — ABNORMAL LOW (ref 3.5–5.1)
Sodium: 134 mmol/L — ABNORMAL LOW (ref 135–145)
Total Bilirubin: 0.3 mg/dL (ref 0.3–1.2)
Total Protein: 4.4 g/dL — ABNORMAL LOW (ref 6.5–8.1)

## 2020-06-10 LAB — HEPATITIS PANEL, ACUTE
HCV Ab: NONREACTIVE
Hep A IgM: NONREACTIVE
Hep B C IgM: REACTIVE — AB
Hepatitis B Surface Ag: NONREACTIVE

## 2020-06-10 LAB — PHOSPHORUS: Phosphorus: 2.7 mg/dL (ref 2.5–4.6)

## 2020-06-10 LAB — MAGNESIUM: Magnesium: 1.8 mg/dL (ref 1.7–2.4)

## 2020-06-10 MED ORDER — POTASSIUM CHLORIDE CRYS ER 20 MEQ PO TBCR
40.0000 meq | EXTENDED_RELEASE_TABLET | ORAL | Status: AC
  Administered 2020-06-10 (×2): 40 meq via ORAL
  Filled 2020-06-10 (×2): qty 2

## 2020-06-10 NOTE — TOC Progression Note (Signed)
Transition of Care [facility]) - Progression Note    Patient Details  Name: Valerie [name] MRN: [id number] Date of Birth: 1946/07/21  [facility]) CM/SW Contact  Valerie Mouton, RN Phone Number: 06/10/2020, 3:58 PM  Clinical Narrative:    Valerie [name] with Valerie [name], Admission Coordinator at Valerie [name]).  If she continues to work with PT, bring her Chemo medications, and plan to go back home at Discharge. Valerie [name] will take her back if a bed is available on Monday or Tuesday next week if pt is ready.    Expected Discharge Plan:  (SNF v Home) Barriers to Discharge: SNF Pending bed offer  Expected Discharge Plan and Services Expected Discharge Plan:  (SNF v Home)   Discharge Planning Services: CM Consult Post Acute Care Choice: Valerie [name] Living arrangements for the past 2 months: Single Family Home                                       Social Determinants of Health (SDOH) Interventions    Readmission Risk Interventions Readmission Risk Prevention Plan 02/18/2020  Transportation Screening Complete  PCP or Specialist Appt within 5-7 Days Complete  Home Care Screening Patient refused  Medication Review (RN CM) Complete  Some recent data might be hidden

## 2020-06-10 NOTE — Progress Notes (Signed)
PROGRESS NOTE    Valerie [name]  ZOX:[id number] DOB: [birth year] DOA: 06/03/2020 PCP: [name], MD   Chief Complaint  Patient presents with  . Weakness  . Wound Check    Brief Narrative:  74 year old female with history of metastatic non-small cell lung cancer, adenocarcinoma, with brain and neck mets, with history of craniotomy for tumor resection, whole brain radiation, gamma knife, right thyroidectomy with radical neck dissection, currently on Tarceva started in July 8119, systolic CHF, recent MRSA bacteremia, recurrent UTIs including ESBL organisms came into the hospital with left-sided flank and back pain as well as failure to thrive, progressive weakness and immobility.  She has been having poor p.o. intake, nausea, vomiting, diarrhea for the past few weeks.  Assessment & Plan:   Active Problems:   Pressure injury of skin   Cellulitis   Rash and nonspecific skin eruption   Metastatic malignant neoplasm (HCC)   Failure to thrive in adult   Asymptomatic bacteriuria  Sepsis 2/2 Cellulitis  Tarceva Related Skin Rash: Vancomycin 10/15-10/18.  Meropenem 10/15-10/18.  Keflex 10/19 - present. ID c/s, appreciate recommendations -> concern for tarceva related rash, recommended completion of abx on 10/21.  If rash continues, discuss with derm/oncology.  5 days triamcinolone.  Eucerin.   1/2 blood cx with staph epi, likely contaminant  Hypokalemia  Hypophosphatemia  Hypomagnesemia replace, follow  Asymptomatic Bacteruria E. Coli and enterobacter on culture, she's asymptomatic, follow  Nausea  Vomiting  Diarrhea  Poor Appetite  Deconditioning  Failure to Thrive: Concern for related to tarceva Follow therapy, encourage PO intake OOBTC, mobilize Diarrhea has improved off tarceva  Hx DVT Continue xarelto  HFrEF Most recent EF 25-30% with global hypokinesis, grade 1 diastolic dysfunction -Continu statin, Plavix, metoprolol -Recent cath done in June showed  minimal CAD, 50% circumflex disease, it was felt that her cardiomyopathy was likely secondary toTagrissowhich has been discontinued at that time. -Appears euvolemic, received IV fluids in the ED and again 10/16.  Avoid further IV fluids, encourage p.o. intake  History of non-small cell lung cancer with mets to the brain and neck -Hold Tarceva per pharmacy in the setting of active infection.  Of note, during her prior hospitalization with MRSA bacteremia it has been continued. -I wonder whether Tarceva is playing Taiylor Virden major role in her rash, poor immune system, failure to thrive, poor appetite, diarrhea, nausea.  Patient's daughter is inclining towards stopping it.  -Discussed with Dr. Julien Nordmann on 10/18, recommends holding Tarceva right now and resuming it at [facility] lower dose on discharge, discussed with husband and patient as well, they are agreeable  Recent MRSA bacteremia -Per prior notes, patient did not want Herma Uballe PICC line and received IV vancomycin while hospitalized for Shani Fitch week and was discharged on linezolid.   Continue current antibiotics, ID to see as well  History of dementia -Mild, alert and oriented x4, continue home medications  Paroxysmal Alaycia Eardley. Fib -Continue Xarelto, metoprolol  Hyperlipidemia -Continue statin  Normocytic anemia -No bleeding, likely in the setting of chronic illness  Elevated LFTs Acute hepatitis panel -> Hep B Core Ab IgM positive Follow repeat hep B core ab, hep b surface Ag, hepatitis B e ab Hold APAP and statin for now Follow   DVT prophylaxis: xarelto Code Status: DNR Family Communication: none at bedside - 10/21 husband Disposition:  Pending decision regarding SNF vs home  Status is: Inpatient  Remains inpatient appropriate because:Inpatient level of care appropriate due to severity of illness   Dispo: The patient is  from: Home              Anticipated d/c is to: Home              Anticipated d/c date is: 1 day              Patient currently is  not medically stable to d/c.  Consultants:   ID  oncology   Procedures:   none  Antimicrobials:  Anti-infectives (From admission, onward)   Start     Dose/Rate Route Frequency Ordered Stop   06/08/20 1200  cephALEXin (KEFLEX) capsule 500 mg        500 mg Oral Every 6 hours 06/08/20 0800 06/11/20 1159   06/07/20 1545  cephALEXin (KEFLEX) capsule 250 mg        250 mg Oral  Once 06/07/20 1534 06/07/20 1612   06/07/20 1415  cephALEXin (KEFLEX) capsule 250 mg  Status:  Discontinued        250 mg Oral Once 06/07/20 1220 06/07/20 1534   06/07/20 1400  ceFAZolin (ANCEF) IVPB 1 g/50 mL premix  Status:  Discontinued        1 g 100 mL/hr over 30 Minutes Intravenous Every 8 hours 06/07/20 1202 06/07/20 1213   06/07/20 1315  cephALEXin (KEFLEX) 125 MG/5ML suspension 125 mg        125 mg Oral Once 06/07/20 1220 06/07/20 1409   06/07/20 1215  cephALEXin (KEFLEX) capsule 250 mg  Status:  Discontinued        250 mg Oral Once 06/07/20 1122 06/07/20 1154   06/05/20 2000  meropenem (MERREM) 1 g in sodium chloride 0.9 % 100 mL IVPB  Status:  Discontinued        1 g 200 mL/hr over 30 Minutes Intravenous Every 8 hours 06/05/20 1239 06/07/20 1010   06/04/20 0000  meropenem (MERREM) 1 g in sodium chloride 0.9 % 100 mL IVPB  Status:  Discontinued        1 g 200 mL/hr over 30 Minutes Intravenous Every 12 hours 06/03/20 1545 06/05/20 1239   06/03/20 2200  vancomycin (VANCOREADY) IVPB 500 mg/100 mL  Status:  Discontinued        500 mg 100 mL/hr over 60 Minutes Intravenous Every 12 hours 06/03/20 1545 06/07/20 1010   06/03/20 1730  vancomycin (VANCOCIN) IVPB 1000 mg/200 mL premix  Status:  Discontinued        1,000 mg 200 mL/hr over 60 Minutes Intravenous  Once 06/03/20 1644 06/03/20 1656   06/03/20 1730  meropenem (MERREM) 1 g in sodium chloride 0.9 % 100 mL IVPB  Status:  Discontinued        1 g 200 mL/hr over 30 Minutes Intravenous  Once 06/03/20 1644 06/03/20 1656   06/03/20 1215  vancomycin  (VANCOCIN) IVPB 1000 mg/200 mL premix        1,000 mg 200 mL/hr over 60 Minutes Intravenous  Once 06/03/20 1200 06/03/20 1334   06/03/20 1200  meropenem (MERREM) 1 g in sodium chloride 0.9 % 100 mL IVPB        1 g 200 mL/hr over 30 Minutes Intravenous  Once 06/03/20 1159 06/03/20 1410         Subjective: No new complaints today tired  Objective: Vitals:   06/10/20 0130 06/10/20 0556 06/10/20 1018 06/10/20 1309  BP: (!) 107/54 117/77 106/61 109/63  Pulse: (!) 116 99 (!) 108 100  Resp: 17 17 16 16   Temp: 99.4 F (37.4 C) 98 F (36.7  C) 97.9 F (36.6 C) (!) 97.5 F (36.4 C)  TempSrc: Oral Oral Oral Oral  SpO2: 95% 100% 97% 100%  Weight:        Intake/Output Summary (Last 24 hours) at 06/10/2020 1631 Last data filed at 06/10/2020 1300 Gross per 24 hour  Intake 600 ml  Output 500 ml  Net 100 ml   Filed Weights   06/03/20 1456  Weight: 59 kg    Examination:  General: No acute distress. Cardiovascular: Heart sounds show Gibbs Naugle regular rate, and rhythm Lungs: Clear to auscultation bilaterally  Abdomen: Soft, nontender, nondistended Neurological: Alert and oriented 3. Moves all extremities 4. Cranial nerves II through XII grossly intact. Skin: stable to slightly improving diffuse erythema with desquamation Extremities: No clubbing or cyanosis. No edema.   Data Reviewed: I have personally reviewed following labs and imaging studies  CBC: Recent Labs  Lab 06/06/20 0442 06/07/20 0420 06/08/20 0412 06/09/20 0355 06/10/20 0352  WBC 16.8* 15.7* 12.5* 11.3* 10.6*  NEUTROABS  --   --  10.4* 9.2* 8.0*  HGB 9.7* 9.7* 8.3* 8.4* 8.0*  HCT 29.8* 29.4* 25.4* 26.2* 24.9*  MCV 94.3 92.7 92.7 94.2 92.6  PLT 329 340 335 351 242    Basic Metabolic Panel: Recent Labs  Lab 06/06/20 0442 06/06/20 0442 06/07/20 0420 06/07/20 2042 06/08/20 0412 06/09/20 0355 06/10/20 0352  NA 135  --  132*  --  133* 133* 134*  K 3.7  --  2.9*  --  3.6 3.2* 3.2*  CL 103  --  101  --  103  102 103  CO2 24  --  22  --  24 24 24   GLUCOSE 111*  --  111*  --  100* 102* 107*  BUN 10  --  11  --  10 16 16   CREATININE 0.65  --  0.50  --  0.49 0.62 0.51  CALCIUM 8.4*  --  8.5*  --  7.9* 7.7* 8.1*  MG 1.9  --  1.7  --  2.2 2.0 1.8  PHOS 1.1*   < > <1.0* 1.8* 2.3* 2.1* 2.7   < > = values in this interval not displayed.    GFR: Estimated Creatinine Clearance: 57.5 mL/min (by C-G formula based on SCr of 0.51 mg/dL).  Liver Function Tests: Recent Labs  Lab 06/05/20 0500 06/07/20 0420 06/08/20 0412 06/09/20 0355 06/10/20 0352  AST 13* 71* 256* 117* 73*  ALT 12 42 171* 146* 123*  ALKPHOS 78 76 73 73 69  BILITOT 0.6 0.3 0.6 0.4 0.3  PROT 4.4* 4.3* 4.1* 4.2* 4.4*  ALBUMIN 1.7* 1.5* 1.4* 1.5* 1.6*    CBG: No results for input(s): GLUCAP in the last 168 hours.   Recent Results (from the past 240 hour(s))  Urine culture     Status: Abnormal   Collection Time: 06/03/20 10:23 AM   Specimen: In/Out Cath Urine  Result Value Ref Range Status   Specimen Description   Final    IN/OUT CATH URINE Performed at [facility] [address], [place], [place] [address]    Special Requests   Final    NONE Performed at [facility], [place] [address], [place], [place] [address]    Culture (Cambreigh Dearing)  Final    >=100,000 COLONIES/mL ESCHERICHIA COLI >=100,000 COLONIES/mL ENTEROBACTER CLOACAE    Report Status 06/06/2020 FINAL  Final   Organism ID, Bacteria ESCHERICHIA COLI (Todrick Siedschlag)  Final   Organism ID, Bacteria ENTEROBACTER CLOACAE (Schelly Chuba)  Final  Susceptibility   Enterobacter cloacae - MIC*    CEFAZOLIN >=64 RESISTANT Resistant     CIPROFLOXACIN 1 SENSITIVE Sensitive     GENTAMICIN <=1 SENSITIVE Sensitive     IMIPENEM 0.5 SENSITIVE Sensitive     NITROFURANTOIN 64 INTERMEDIATE Intermediate     TRIMETH/SULFA <=20 SENSITIVE Sensitive     PIP/TAZO 16 SENSITIVE Sensitive     * >=100,000 COLONIES/mL ENTEROBACTER CLOACAE   Escherichia coli - MIC*     AMPICILLIN 4 SENSITIVE Sensitive     CEFAZOLIN <=4 SENSITIVE Sensitive     CEFTRIAXONE <=0.25 SENSITIVE Sensitive     CIPROFLOXACIN >=4 RESISTANT Resistant     GENTAMICIN >=16 RESISTANT Resistant     IMIPENEM <=0.25 SENSITIVE Sensitive     NITROFURANTOIN <=16 SENSITIVE Sensitive     TRIMETH/SULFA <=20 SENSITIVE Sensitive     AMPICILLIN/SULBACTAM <=2 SENSITIVE Sensitive     PIP/TAZO <=4 SENSITIVE Sensitive     * >=100,000 COLONIES/mL ESCHERICHIA COLI  Respiratory Panel by RT PCR (Flu Savannah Erbe&B, Covid) - Urine, Catheterized     Status: None   Collection Time: 06/03/20 10:23 AM   Specimen: Urine, Catheterized; Nasopharyngeal  Result Value Ref Range Status   SARS Coronavirus 2 by RT PCR NEGATIVE NEGATIVE Final    Comment: (NOTE) SARS-CoV-2 target nucleic acids are NOT DETECTED.  The SARS-CoV-2 RNA is generally detectable in upper respiratoy specimens during the acute phase of infection. The lowest concentration of SARS-CoV-2 viral copies this assay can detect is 131 copies/mL. Alexio Sroka negative result does not preclude SARS-Cov-2 infection and should not be used as the sole basis for treatment or other patient management decisions. Siler Mavis negative result may occur with  improper specimen collection/handling, submission of specimen other than nasopharyngeal swab, presence of viral mutation(s) within the areas targeted by this assay, and inadequate number of viral copies (<131 copies/mL). Mostyn Varnell negative result must be combined with clinical observations, patient history, and epidemiological information. The expected result is Negative.  Fact Sheet for Patients:  PinkCheek.be  Fact Sheet for Healthcare Providers:  GravelBags.it  This test is no t yet approved or cleared by the [place] FDA and  has been authorized for detection and/or diagnosis of SARS-CoV-2 by FDA under an Emergency Use Authorization (EUA). This EUA will remain  in effect  (meaning this test can be used) for the duration of the COVID-19 declaration under Section 564(b)(1) of the Act, 21 U.S.C. section 360bbb-3(b)(1), unless the authorization is terminated or revoked sooner.     Influenza Terrie Grajales by PCR NEGATIVE NEGATIVE Final   Influenza B by PCR NEGATIVE NEGATIVE Final    Comment: (NOTE) The Xpert Xpress SARS-CoV-2/FLU/RSV assay is intended as an aid in  the diagnosis of influenza from Nasopharyngeal swab specimens and  should not be used as Kaelan Emami sole basis for treatment. Nasal washings and  aspirates are unacceptable for Xpert Xpress SARS-CoV-2/FLU/RSV  testing.  Fact Sheet for Patients: PinkCheek.be  Fact Sheet for Healthcare Providers: GravelBags.it  This test is not yet approved or cleared by the [place] FDA and  has been authorized for detection and/or diagnosis of SARS-CoV-2 by  FDA under an Emergency Use Authorization (EUA). This EUA will remain  in effect (meaning this test can be used) for the duration of the  Covid-19 declaration under Section 564(b)(1) of the Act, 21  U.S.C. section 360bbb-3(b)(1), unless the authorization is  terminated or revoked. Performed at [facility], [place] [address], [place], [place] [address]   Blood Culture (routine x  2)     Status: Abnormal   Collection Time: 06/03/20 10:30 AM   Specimen: BLOOD LEFT ARM  Result Value Ref Range Status   Specimen Description   Final    BLOOD LEFT ARM Performed at [facility] [address], [place], [place] [address]    Special Requests   Final    BOTTLES DRAWN AEROBIC AND ANAEROBIC Blood Culture adequate volume Performed at [facility] [address], [place], [place] [address]    Culture  Setup Time   Final    ANAEROBIC BOTTLE ONLY GRAM POSITIVE COCCI CRITICAL RESULT CALLED TO, READ BACK BY AND VERIFIED WITH: PHARMD M HICKS [id number] AT 79 AM BY CM     Culture (Fransheska Willingham)  Final    STAPHYLOCOCCUS EPIDERMIDIS THE SIGNIFICANCE OF ISOLATING THIS ORGANISM FROM Bron Snellings SINGLE SET OF BLOOD CULTURES WHEN MULTIPLE SETS ARE DRAWN IS UNCERTAIN. PLEASE NOTIFY THE MICROBIOLOGY DEPARTMENT WITHIN ONE WEEK IF SPECIATION AND SENSITIVITIES ARE REQUIRED. Performed at [facility], [place] [address] [place], [place] [address]    Report Status 06/06/2020 FINAL  Final  Blood Culture ID Panel (Reflexed)     Status: Abnormal   Collection Time: 06/03/20 10:30 AM  Result Value Ref Range Status   Enterococcus faecalis NOT DETECTED NOT DETECTED Final   Enterococcus Faecium NOT DETECTED NOT DETECTED Final   Listeria monocytogenes NOT DETECTED NOT DETECTED Final   Staphylococcus species DETECTED (Aadin Gaut) NOT DETECTED Final    Comment: CRITICAL RESULT CALLED TO, READ BACK BY AND VERIFIED WITH: PHARMD M HICKS [id number] AT 855 BY CM    Staphylococcus aureus (BCID) NOT DETECTED NOT DETECTED Final   Staphylococcus epidermidis DETECTED (Hilario Robarts) NOT DETECTED Final    Comment: Methicillin (oxacillin) resistant coagulase negative staphylococcus. Possible blood culture contaminant (unless isolated from more than one blood culture draw or clinical case suggests pathogenicity). No antibiotic treatment is indicated for blood  culture contaminants. CRITICAL RESULT CALLED TO, READ BACK BY AND VERIFIED WITH: PHARMD M HICKS [id number] AT 54 AM BY CM    Staphylococcus lugdunensis NOT DETECTED NOT DETECTED Final   Streptococcus species NOT DETECTED NOT DETECTED Final   Streptococcus agalactiae NOT DETECTED NOT DETECTED Final   Streptococcus pneumoniae NOT DETECTED NOT DETECTED Final   Streptococcus pyogenes NOT DETECTED NOT DETECTED Final   Naylani Bradner.calcoaceticus-baumannii NOT DETECTED NOT DETECTED Final   Bacteroides fragilis NOT DETECTED NOT DETECTED Final   Enterobacterales NOT DETECTED NOT DETECTED Final   Enterobacter cloacae complex NOT DETECTED NOT DETECTED Final   Escherichia coli NOT DETECTED NOT  DETECTED Final   Klebsiella aerogenes NOT DETECTED NOT DETECTED Final   Klebsiella oxytoca NOT DETECTED NOT DETECTED Final   Klebsiella pneumoniae NOT DETECTED NOT DETECTED Final   Proteus species NOT DETECTED NOT DETECTED Final   Salmonella species NOT DETECTED NOT DETECTED Final   Serratia marcescens NOT DETECTED NOT DETECTED Final   Haemophilus influenzae NOT DETECTED NOT DETECTED Final   Neisseria meningitidis NOT DETECTED NOT DETECTED Final   Pseudomonas aeruginosa NOT DETECTED NOT DETECTED Final   Stenotrophomonas maltophilia NOT DETECTED NOT DETECTED Final   Candida albicans NOT DETECTED NOT DETECTED Final   Candida auris NOT DETECTED NOT DETECTED Final   Candida glabrata NOT DETECTED NOT DETECTED Final   Candida krusei NOT DETECTED NOT DETECTED Final   Candida parapsilosis NOT DETECTED NOT DETECTED Final   Candida tropicalis NOT DETECTED NOT DETECTED Final   Cryptococcus neoformans/gattii NOT DETECTED NOT DETECTED Final   Methicillin resistance  mecA/C DETECTED (Jamisen Hawes) NOT DETECTED Final    Comment: CRITICAL RESULT CALLED TO, READ BACK BY AND VERIFIED WITH: PHARMD M HICKS [id number] AT 855 AM BY CM Performed at [facility], [place] [address] [place], [place] [address]   Blood Culture (routine x 2)     Status: None   Collection Time: 06/03/20 10:45 AM   Specimen: BLOOD  Result Value Ref Range Status   Specimen Description   Final    BLOOD LEFT ANTECUBITAL Performed at [facility], [place] [address], [place], [place] [address]    Special Requests   Final    BOTTLES DRAWN AEROBIC AND ANAEROBIC Blood Culture adequate volume Performed at [facility] [address], [place], [place] [address]    Culture   Final    NO GROWTH 5 DAYS Performed at [facility], [place] [address], [place], [place] [address]    Report Status 06/08/2020 FINAL  Final         Radiology Studies: No results found.      Scheduled Meds: . cephALEXin  500 mg Oral Q6H    . cholecalciferol  50 mcg Oral Daily  . clopidogrel  75 mg Oral Q breakfast  . donepezil  10 mg Oral QHS  . feeding supplement  1 Container Oral BID BM  . Gerhardt's butt cream   Topical BID  . [START ON 06/12/2020] hydrocerin   Topical BID  . magnesium oxide  400 mg Oral Daily  . metoprolol succinate  25 mg Oral Daily  . multivitamin with minerals  1 tablet Oral QPM  . nystatin   Topical BID  . rivaroxaban  20 mg Oral QPM  . saccharomyces boulardii  250 mg Oral BID  . triamcinolone ointment   Topical BID  . vitamin B-12  1,000 mcg Oral QPM  . vitamin C  250 mg Oral QPM   Continuous Infusions:   LOS: 7 days    Time spent: over 30 min    Fayrene Helper, MD [facility]   To contact the attending provider between 7A-7P or the covering provider during after hours 7P-7A, please log into the web site [email or web address] and access using universal [place] password for that web site. If you do not have the password, please call the hospital operator.  06/10/2020, 4:31 PM

## 2020-06-10 NOTE — Progress Notes (Signed)
Physical Therapy Treatment Patient Details Name: Valerie [name] MRN: [id number] DOB: 1945-11-19 Today's Date: 06/10/2020    History of Present Illness Pt is 74 yo female with hx including metastatic lung CA, adenocarcinoma with brain and neck mets, hx of craniotomy for tumor resection, whole brain radiation, R thyroidectomy with radical neck dissection, CHF, MRSA, and UTIs.  Pt presented to hospital with L sided flank/back pain as well as FTT and progressive weakness. Pt admitted with sepsis due to back cellulitis and possible UTI.    PT Comments    Pt requiring total assist for bed mobility however did sit EOB for a few minutes.  Pt fatigues quickly.  Recommend SNF upon d/c.  If SNF is not available, pt will need increased home care/assist.   Follow Up Recommendations  SNF;Supervision/Assistance - 24 hour     Equipment Recommendations  None recommended by PT    Recommendations for Other Services       Precautions / Restrictions Precautions Precautions: Fall Precaution Comments: wounds - entire back excoriated/superficial wounds; non ambulatory for 1 year per pt    Mobility  Bed Mobility Overal bed mobility: Needs Assistance Bed Mobility: Supine to Sit;Sit to Supine     Supine to sit: Total assist Sit to supine: Total assist   General bed mobility comments: pt able to move LEs minimally on the bed and attempted to assist with upper body however very weak; utilized bed pads and bed functions for positioning  Transfers                    Ambulation/Gait                 Stairs             Wheelchair Mobility    Modified Rankin (Stroke Patients Only)       Balance Overall balance assessment: Needs assistance Sitting-balance support: Bilateral upper extremity supported Sitting balance-Leahy Scale: Poor Sitting balance - Comments: kyphotic but looses balance posteriorly; reliant on UE support, required relaxing trunk back onto bed for a  couple minutes as rest break and then pt returned briefly to sitting before return to bed; fatigues very quickly Postural control: Posterior lean                                  Cognition Arousal/Alertness: Lethargic Behavior During Therapy: Flat affect;Anxious Overall Cognitive Status: No family/caregiver present to determine baseline cognitive functioning                                 General Comments: Followed simple commands with increased time.  Fatigues quickly.      Exercises      General Comments        Pertinent Vitals/Pain Pain Assessment: Faces Faces Pain Scale: Hurts a little bit Pain Location: back Pain Descriptors / Indicators: Grimacing;Guarding Pain Intervention(s): Repositioned;Monitored during session    Home Living                      Prior Function            PT Goals (current goals can now be found in the care plan section) Progress towards PT goals: Progressing toward goals    Frequency    Min 2X/week      PT Plan Current plan remains appropriate  Co-evaluation              AM-PAC PT "6 Clicks" Mobility   Outcome Measure  Help needed turning from your back to your side while in a flat bed without using bedrails?: A Lot Help needed moving from lying on your back to sitting on the side of a flat bed without using bedrails?: Total Help needed moving to and from a bed to a chair (including a wheelchair)?: Total Help needed standing up from a chair using your arms (e.g., wheelchair or bedside chair)?: Total Help needed to walk in hospital room?: Total Help needed climbing 3-5 steps with a railing? : Total 6 Click Score: 7    End of Session   Activity Tolerance: Patient limited by fatigue Patient left: in bed;with call bell/phone within reach;with bed alarm set   PT Visit Diagnosis: Muscle weakness (generalized) (M62.81);Unsteadiness on feet (R26.81)     Time: 1024-1040 PT Time  Calculation (min) (ACUTE ONLY): 16 min  Charges:  $Therapeutic Activity: 8-22 mins                     Jannette Spanner PT, DPT Acute Rehabilitation Services Pager: [phone] Office: [phone]  York Ram E 06/10/2020, 12:10 PM

## 2020-06-10 NOTE — Progress Notes (Signed)
Initial Nutrition Assessment  DOCUMENTATION CODES:   Non-severe (moderate) malnutrition in context of chronic illness  INTERVENTION:  - continue Boost Breeze BID, Carnation Instant Breakfast once/day, and Magic Cup BID. - will d/c Unjury per patient request.  NUTRITION DIAGNOSIS:   Moderate Malnutrition related to chronic illness, cancer and cancer related treatments as evidenced by moderate fat depletion, moderate muscle depletion. -revised, ongoing  GOAL:   Patient will meet greater than or equal to 90% of their needs -minimally met on average  MONITOR:   PO intake, Supplement acceptance, Labs, Weight trends  ASSESSMENT:   74 year old female with history of metastatic non-small cell lung cancer, adenocarcinoma with brain mets initially diagnosed as stage IIb in 2012 s/p craniotomy for tumor resection, whole brain radiation, gamma knife, right thyroidectomy with radical neck dissection currently on Tarceva, sCHF, recent admission for MRSA bacteremia, and recurrent UTIs presented with left-sided flank pain, minimal ambulation d/t weakness, poor po with nausea, vomiting, diarrhea and increased back pain. Pt found to have large area of cellulitis spanning from buttocks to shoulder and admitted for sepsis due to cellulitis and possible UTI.  She has mainly been eating 50-75% of meals over the past 1 week. She has been accepting supplements listed about 75+% of the time offered, except for Unjury which she has refused nearly each time offered. Patient does not wish to receive this supplement and is appreciative of plan to discontinue it.   When she arrived to the hospital her appetite was poor and she was disinterested in eating, but appetite has much improved and she feels she is getting closer to baseline with eating.  It was difficult to obtain much other information from patient concerning nutrition at this time. Of note, she is having L-sided discomfort and mild R-sided discomfort with  R side greatly improved from PTA. She relates this to sores related to cancer treatment. She is R handed.   She has not been weighed since admission on 10/15.     Labs reviewed; Na: 134 mmol/l, K: 3.2 mmol/l, Ca: 8.1 mg/dl, LFTs elevated. Medications reviewed; 2000 units cholecalciferol/day, 400 mg mag-ox/day, 1 tablet multivitamin with minerals/day, 40 mEq Klor-Con x2 doses 10/22, 250 mg florastor BID, 1000 mcg oral cyanocobalamin/day, 250 mg ascorbic acid/day.     NUTRITION - FOCUSED PHYSICAL EXAM:    Most Recent Value  Orbital Region Moderate depletion  Upper Arm Region Moderate depletion  Thoracic and Lumbar Region Unable to assess  Buccal Region Moderate depletion  Temple Region Mild depletion  Clavicle Bone Region Moderate depletion  Clavicle and Acromion Bone Region Moderate depletion  Scapular Bone Region Unable to assess  Dorsal Hand Mild depletion  Patellar Region Unable to assess  Anterior Thigh Region Unable to assess  Posterior Calf Region Unable to assess  Edema (RD Assessment) Unable to assess  Hair Reviewed  Eyes Reviewed  Mouth Reviewed  Skin Reviewed  Nails Reviewed       Diet Order:   Diet Order            Diet regular Room service appropriate? Yes; Fluid consistency: Thin  Diet effective now                 EDUCATION NEEDS:   No education needs have been identified at this time  Skin:  Skin Assessment: Skin Integrity Issues: Skin Integrity Issues:: Stage II, Other (Comment), DTI DTI: heel Stage II: lower back Other: open wound; bleeding; back; right, upper, lateral; IAD associated dermatitis; buttocks; MASD;  bilateral breast  Last BM:  10/21 (type 6)  Height:   Ht Readings from Last 1 Encounters:  04/26/20 _0  (1.702 m)    Weight:   Wt Readings from Last 1 Encounters:  06/03/20 59 kg     Estimated Nutritional Needs:  Kcal:  5797-2820 (32-35 kcal/kg) Protein:  95-106 Fluid:  >/= 1.8 L/day     Jarome Matin, MS, RD,  LDN, CNSC Inpatient Clinical Dietitian RD pager # available in AMION  After hours/weekend pager # available in [facility]

## 2020-06-11 ENCOUNTER — Inpatient Hospital Stay: Payer: Self-pay

## 2020-06-11 DIAGNOSIS — L03312 Cellulitis of back [any part except buttock]: Secondary | ICD-10-CM | POA: Diagnosis not present

## 2020-06-11 DIAGNOSIS — A419 Sepsis, unspecified organism: Secondary | ICD-10-CM | POA: Diagnosis not present

## 2020-06-11 LAB — CBC WITH DIFFERENTIAL/PLATELET
Abs Immature Granulocytes: 0.13 10*3/uL — ABNORMAL HIGH (ref 0.00–0.07)
Basophils Absolute: 0 10*3/uL (ref 0.0–0.1)
Basophils Relative: 0 %
Eosinophils Absolute: 0.5 10*3/uL (ref 0.0–0.5)
Eosinophils Relative: 5 %
HCT: 23.4 % — ABNORMAL LOW (ref 36.0–46.0)
Hemoglobin: 7.6 g/dL — ABNORMAL LOW (ref 12.0–15.0)
Immature Granulocytes: 1 %
Lymphocytes Relative: 12 %
Lymphs Abs: 1.2 10*3/uL (ref 0.7–4.0)
MCH: 30 pg (ref 26.0–34.0)
MCHC: 32.5 g/dL (ref 30.0–36.0)
MCV: 92.5 fL (ref 80.0–100.0)
Monocytes Absolute: 0.8 10*3/uL (ref 0.1–1.0)
Monocytes Relative: 8 %
Neutro Abs: 7.7 10*3/uL (ref 1.7–7.7)
Neutrophils Relative %: 74 %
Platelets: 362 10*3/uL (ref 150–400)
RBC: 2.53 MIL/uL — ABNORMAL LOW (ref 3.87–5.11)
RDW: 17.2 % — ABNORMAL HIGH (ref 11.5–15.5)
WBC: 10.3 10*3/uL (ref 4.0–10.5)
nRBC: 0 % (ref 0.0–0.2)

## 2020-06-11 LAB — IRON AND TIBC
Iron: 35 ug/dL (ref 28–170)
Saturation Ratios: 20 % (ref 10.4–31.8)
TIBC: 174 ug/dL — ABNORMAL LOW (ref 250–450)
UIBC: 139 ug/dL

## 2020-06-11 LAB — FERRITIN: Ferritin: 119 ng/mL (ref 11–307)

## 2020-06-11 LAB — COMPREHENSIVE METABOLIC PANEL
ALT: 97 U/L — ABNORMAL HIGH (ref 0–44)
AST: 55 U/L — ABNORMAL HIGH (ref 15–41)
Albumin: 1.5 g/dL — ABNORMAL LOW (ref 3.5–5.0)
Alkaline Phosphatase: 68 U/L (ref 38–126)
Anion gap: 9 (ref 5–15)
BUN: 12 mg/dL (ref 8–23)
CO2: 25 mmol/L (ref 22–32)
Calcium: 8.3 mg/dL — ABNORMAL LOW (ref 8.9–10.3)
Chloride: 103 mmol/L (ref 98–111)
Creatinine, Ser: 0.48 mg/dL (ref 0.44–1.00)
GFR, Estimated: >60 mL/min (ref >60)
Glucose, Bld: 99 mg/dL (ref 70–99)
Potassium: 3.2 mmol/L — ABNORMAL LOW (ref 3.5–5.1)
Sodium: 137 mmol/L (ref 135–145)
Total Bilirubin: 0.5 mg/dL (ref 0.3–1.2)
Total Protein: 4.1 g/dL — ABNORMAL LOW (ref 6.5–8.1)

## 2020-06-11 LAB — TYPE AND SCREEN
ABO/RH(D): A NEG
Antibody Screen: NEGATIVE

## 2020-06-11 LAB — PHOSPHORUS: Phosphorus: 2.7 mg/dL (ref 2.5–4.6)

## 2020-06-11 LAB — FOLATE: Folate: 7.4 ng/mL (ref >5.9)

## 2020-06-11 LAB — HEMOGLOBIN AND HEMATOCRIT, BLOOD
HCT: 26.1 % — ABNORMAL LOW (ref 36.0–46.0)
Hemoglobin: 8.5 g/dL — ABNORMAL LOW (ref 12.0–15.0)

## 2020-06-11 LAB — HEPATITIS B CORE ANTIBODY, TOTAL: Hep B Core Total Ab: NONREACTIVE

## 2020-06-11 LAB — HEPATITIS B CORE ANTIBODY, IGM: Hep B C IgM: NONREACTIVE

## 2020-06-11 LAB — MAGNESIUM: Magnesium: 1.9 mg/dL (ref 1.7–2.4)

## 2020-06-11 LAB — VITAMIN B12: Vitamin B-12: 658 pg/mL (ref 180–914)

## 2020-06-11 LAB — HEPATITIS B SURFACE ANTIGEN: Hepatitis B Surface Ag: NONREACTIVE

## 2020-06-11 MED ORDER — METOPROLOL TARTRATE 5 MG/5ML IV SOLN: 2.5000 mg | INTRAVENOUS | Status: DC | PRN

## 2020-06-11 MED ORDER — SODIUM CHLORIDE 0.9% FLUSH
10.0000 mL | Freq: Two times a day (BID) | INTRAVENOUS | Status: DC
  Administered 2020-06-11 – 2020-06-12 (×3): 10 mL

## 2020-06-11 MED ORDER — SODIUM CHLORIDE 0.9% FLUSH: 10.0000 mL | INTRAVENOUS | Status: DC | PRN

## 2020-06-11 MED ORDER — METOPROLOL TARTRATE 5 MG/5ML IV SOLN
2.5000 mg | INTRAVENOUS | Status: DC | PRN
  Administered 2020-06-11: 2.5 mg via INTRAVENOUS
  Filled 2020-06-11: qty 5

## 2020-06-11 MED ORDER — LACTATED RINGERS IV BOLUS: 250.0000 mL | Freq: Once | INTRAVENOUS | Status: DC

## 2020-06-11 MED ORDER — POTASSIUM CHLORIDE CRYS ER 20 MEQ PO TBCR
40.0000 meq | EXTENDED_RELEASE_TABLET | ORAL | Status: AC
  Administered 2020-06-11 (×2): 40 meq via ORAL
  Filled 2020-06-11 (×2): qty 2

## 2020-06-11 NOTE — Progress Notes (Signed)
PROGRESS NOTE    ALLAHNA HUSBAND  XIP:[id number] DOB: Jul 06, 1946 DOA: 06/03/2020 PCP: [name], MD   Chief Complaint  Patient presents with  . Weakness  . Wound Check    Brief Narrative:  74 year old female with history of metastatic non-small cell lung cancer, adenocarcinoma, with brain and neck mets, with history of craniotomy for tumor resection, whole brain radiation, gamma knife, right thyroidectomy with radical neck dissection, currently on Tarceva started in July 6734, systolic CHF, recent MRSA bacteremia, recurrent UTIs including ESBL organisms came into the hospital with left-sided flank and back pain as well as failure to thrive, progressive weakness and immobility.  She has been having poor p.o. intake, nausea, vomiting, diarrhea for the past few weeks.  Assessment & Plan:   Active Problems:   Pressure injury of skin   Cellulitis   Rash and nonspecific skin eruption   Metastatic malignant neoplasm (HCC)   Failure to thrive in adult   Asymptomatic bacteriuria  Sepsis 2/2 Cellulitis  Tarceva Related Skin Rash: Vancomycin 10/15-10/18.  Meropenem 10/15-10/18.  Keflex 10/19 - present. ID c/s, appreciate recommendations -> concern for tarceva related rash, recommended completion of abx on 10/21.  If rash continues, discuss with derm/oncology.  5 days triamcinolone.  Eucerin.   1/2 blood cx with staph epi, likely contaminant  Hypokalemia  Hypophosphatemia  Hypomagnesemia replace, follow  Asymptomatic Bacteruria E. Coli and enterobacter on culture, she's asymptomatic, follow  Nausea  Vomiting  Diarrhea  Poor Appetite  Deconditioning  Failure to Thrive: Concern for related to tarceva Follow therapy, encourage PO intake OOBTC, mobilize Diarrhea has improved off tarceva  Hx DVT Continue xarelto  HFrEF Most recent EF 25-30% with global hypokinesis, grade 1 diastolic dysfunction -Continu statin, Plavix, metoprolol -Recent cath done in June showed  minimal CAD, 50% circumflex disease, it was felt that her cardiomyopathy was likely secondary toTagrissowhich has been discontinued at that time. -Appears euvolemic, received IV fluids in the ED and again 10/16.  Avoid further IV fluids, encourage p.o. intake  History of non-small cell lung cancer with mets to the brain and neck -Hold Tarceva per pharmacy in the setting of active infection.  Of note, during her prior hospitalization with MRSA bacteremia it has been continued. -I wonder whether Tarceva is playing Lakiah Dhingra major role in her rash, poor immune system, failure to thrive, poor appetite, diarrhea, nausea.  Patient's daughter is inclining towards stopping it.  -Discussed with Dr. Julien Nordmann on 10/18, recommends holding Tarceva right now and resuming it at Julann Mcgilvray lower dose on discharge, discussed with husband and patient as well, they are agreeable  Recent MRSA bacteremia -Per prior notes, patient did not want Christain Mcraney PICC line and received IV vancomycin while hospitalized for Farhana Fellows week and was discharged on linezolid.   Continue current antibiotics, ID to see as well  History of dementia -Mild, alert and oriented x4, continue home medications  Paroxysmal Jenel Gierke. Fib -Continue Xarelto, metoprolol  Hyperlipidemia -Continue statin  Normocytic anemia -No bleeding, likely in the setting of chronic illness  Elevated LFTs Acute hepatitis panel -> Hep B Core Ab IgM positive Follow repeat hep B core ab, hep b surface Ag, hepatitis B e ab Hold APAP and statin for now Follow   Goals of Care She's agreeable to discussing [place] with palliative  DVT prophylaxis: xarelto Code Status: DNR Family Communication: none at bedside - 10/21 husband Disposition:  Pending decision regarding SNF vs home  Status is: Inpatient  Remains inpatient appropriate because:Inpatient level of care appropriate  due to severity of illness   Dispo: The patient is from: Home              Anticipated d/c is to: Home               Anticipated d/c date is: 1 day              Patient currently is not medically stable to d/c.  Consultants:   ID  oncology   Procedures:   none  Antimicrobials:  Anti-infectives (From admission, onward)   Start     Dose/Rate Route Frequency Ordered Stop   06/08/20 1200  cephALEXin (KEFLEX) capsule 500 mg        500 mg Oral Every 6 hours 06/08/20 0800 06/11/20 0448   06/07/20 1545  cephALEXin (KEFLEX) capsule 250 mg        250 mg Oral  Once 06/07/20 1534 06/07/20 1612   06/07/20 1415  cephALEXin (KEFLEX) capsule 250 mg  Status:  Discontinued        250 mg Oral Once 06/07/20 1220 06/07/20 1534   06/07/20 1400  ceFAZolin (ANCEF) IVPB 1 g/50 mL premix  Status:  Discontinued        1 g 100 mL/hr over 30 Minutes Intravenous Every 8 hours 06/07/20 1202 06/07/20 1213   06/07/20 1315  cephALEXin (KEFLEX) 125 MG/5ML suspension 125 mg        125 mg Oral Once 06/07/20 1220 06/07/20 1409   06/07/20 1215  cephALEXin (KEFLEX) capsule 250 mg  Status:  Discontinued        250 mg Oral Once 06/07/20 1122 06/07/20 1154   06/05/20 2000  meropenem (MERREM) 1 g in sodium chloride 0.9 % 100 mL IVPB  Status:  Discontinued        1 g 200 mL/hr over 30 Minutes Intravenous Every 8 hours 06/05/20 1239 06/07/20 1010   06/04/20 0000  meropenem (MERREM) 1 g in sodium chloride 0.9 % 100 mL IVPB  Status:  Discontinued        1 g 200 mL/hr over 30 Minutes Intravenous Every 12 hours 06/03/20 1545 06/05/20 1239   06/03/20 2200  vancomycin (VANCOREADY) IVPB 500 mg/100 mL  Status:  Discontinued        500 mg 100 mL/hr over 60 Minutes Intravenous Every 12 hours 06/03/20 1545 06/07/20 1010   06/03/20 1730  vancomycin (VANCOCIN) IVPB 1000 mg/200 mL premix  Status:  Discontinued        1,000 mg 200 mL/hr over 60 Minutes Intravenous  Once 06/03/20 1644 06/03/20 1656   06/03/20 1730  meropenem (MERREM) 1 g in sodium chloride 0.9 % 100 mL IVPB  Status:  Discontinued        1 g 200 mL/hr over 30 Minutes Intravenous   Once 06/03/20 1644 06/03/20 1656   06/03/20 1215  vancomycin (VANCOCIN) IVPB 1000 mg/200 mL premix        1,000 mg 200 mL/hr over 60 Minutes Intravenous  Once 06/03/20 1200 06/03/20 1334   06/03/20 1200  meropenem (MERREM) 1 g in sodium chloride 0.9 % 100 mL IVPB        1 g 200 mL/hr over 30 Minutes Intravenous  Once 06/03/20 1159 06/03/20 1410     Subjective: No new complaints today  Objective: Vitals:   06/10/20 1309 06/10/20 2022 06/11/20 0440 06/11/20 1302  BP: 109/63 (!) 94/54 (!) 103/57 110/70  Pulse: 100 (!) 104 97 (!) 106  Resp: 16 17 14 19   Temp: [name])  97.5 F (36.4 C) 98.9 F (37.2 C) 98.7 F (37.1 C) 97.9 F (36.6 C)  TempSrc: Oral Oral Oral Oral  SpO2: 100% 98% 97% 97%  Weight:        Intake/Output Summary (Last 24 hours) at 06/11/2020 1415 Last data filed at 06/11/2020 1610 Gross per 24 hour  Intake 480 ml  Output 800 ml  Net -320 ml   Filed Weights   06/03/20 1456  Weight: 59 kg    Examination:  General: No acute distress. Cardiovascular: Heart sounds show Marijke Guadiana regular rate, and rhythm Lungs: Clear to auscultation bilaterally Abdomen: Soft, nontender, nondistended  Neurological: Alert and oriented 3. Moves all extremities 4. Cranial nerves II through XII grossly intact. Skin: back not examined today   Data Reviewed: I have personally reviewed following labs and imaging studies  CBC: Recent Labs  Lab 06/07/20 0420 06/07/20 0420 06/08/20 0412 06/09/20 0355 06/10/20 0352 06/11/20 0500 06/11/20 1219  WBC 15.7*  --  12.5* 11.3* 10.6* 10.3  --   NEUTROABS  --   --  10.4* 9.2* 8.0* 7.7  --   HGB 9.7*   < > 8.3* 8.4* 8.0* 7.6* 8.5*  HCT 29.4*   < > 25.4* 26.2* 24.9* 23.4* 26.1*  MCV 92.7  --  92.7 94.2 92.6 92.5  --   PLT 340  --  335 351 345 362  --    < > = values in this interval not displayed.    Basic Metabolic Panel: Recent Labs  Lab 06/07/20 0420 06/07/20 0420 06/07/20 2042 06/08/20 0412 06/09/20 0355 06/10/20 0352  06/11/20 0500  NA 132*  --   --  133* 133* 134* 137  K 2.9*  --   --  3.6 3.2* 3.2* 3.2*  CL 101  --   --  103 102 103 103  CO2 22  --   --  24 24 24 25   GLUCOSE 111*  --   --  100* 102* 107* 99  BUN 11  --   --  10 16 16 12   CREATININE 0.50  --   --  0.49 0.62 0.51 0.48  CALCIUM 8.5*  --   --  7.9* 7.7* 8.1* 8.3*  MG 1.7  --   --  2.2 2.0 1.8 1.9  PHOS <1.0*   < > 1.8* 2.3* 2.1* 2.7 2.7   < > = values in this interval not displayed.    GFR: Estimated Creatinine Clearance: 57.5 mL/min (by C-G formula based on SCr of 0.48 mg/dL).  Liver Function Tests: Recent Labs  Lab 06/07/20 0420 06/08/20 0412 06/09/20 0355 06/10/20 0352 06/11/20 0500  AST 71* 256* 117* 73* 55*  ALT 42 171* 146* 123* 97*  ALKPHOS 76 73 73 69 68  BILITOT 0.3 0.6 0.4 0.3 0.5  PROT 4.3* 4.1* 4.2* 4.4* 4.1*  ALBUMIN 1.5* 1.4* 1.5* 1.6* 1.5*    CBG: No results for input(s): GLUCAP in the last 168 hours.   Recent Results (from the past 240 hour(s))  Urine culture     Status: Abnormal   Collection Time: 06/03/20 10:23 AM   Specimen: In/Out Cath Urine  Result Value Ref Range Status   Specimen Description   Final    IN/OUT CATH URINE Performed at [facility] [address], [place], [place] [address]    Special Requests   Final    NONE Performed at [facility], [place] [address], [place], [place] [address]    Culture (Allante Whitmire)  Final    >=100,000 COLONIES/mL ESCHERICHIA COLI >=100,000 COLONIES/mL ENTEROBACTER CLOACAE    Report Status 06/06/2020 FINAL  Final   Organism ID, Bacteria ESCHERICHIA COLI (Lorieann Argueta)  Final   Organism ID, Bacteria ENTEROBACTER CLOACAE (Aino Heckert)  Final      Susceptibility   Enterobacter cloacae - MIC*    CEFAZOLIN >=64 RESISTANT Resistant     CIPROFLOXACIN 1 SENSITIVE Sensitive     GENTAMICIN <=1 SENSITIVE Sensitive     IMIPENEM 0.5 SENSITIVE Sensitive     NITROFURANTOIN 64 INTERMEDIATE Intermediate     TRIMETH/SULFA <=20 SENSITIVE Sensitive      PIP/TAZO 16 SENSITIVE Sensitive     * >=100,000 COLONIES/mL ENTEROBACTER CLOACAE   Escherichia coli - MIC*    AMPICILLIN 4 SENSITIVE Sensitive     CEFAZOLIN <=4 SENSITIVE Sensitive     CEFTRIAXONE <=0.25 SENSITIVE Sensitive     CIPROFLOXACIN >=4 RESISTANT Resistant     GENTAMICIN >=16 RESISTANT Resistant     IMIPENEM <=0.25 SENSITIVE Sensitive     NITROFURANTOIN <=16 SENSITIVE Sensitive     TRIMETH/SULFA <=20 SENSITIVE Sensitive     AMPICILLIN/SULBACTAM <=2 SENSITIVE Sensitive     PIP/TAZO <=4 SENSITIVE Sensitive     * >=100,000 COLONIES/mL ESCHERICHIA COLI  Respiratory Panel by RT PCR (Flu Kedar Sedano&B, Covid) - Urine, Catheterized     Status: None   Collection Time: 06/03/20 10:23 AM   Specimen: Urine, Catheterized; Nasopharyngeal  Result Value Ref Range Status   SARS Coronavirus 2 by RT PCR NEGATIVE NEGATIVE Final    Comment: (NOTE) SARS-CoV-2 target nucleic acids are NOT DETECTED.  The SARS-CoV-2 RNA is generally detectable in upper respiratoy specimens during the acute phase of infection. The lowest concentration of SARS-CoV-2 viral copies this assay can detect is 131 copies/mL. Madysen Faircloth negative result does not preclude SARS-Cov-2 infection and should not be used as the sole basis for treatment or other patient management decisions. Domini Vandehei negative result may occur with  improper specimen collection/handling, submission of specimen other than nasopharyngeal swab, presence of viral mutation(s) within the areas targeted by this assay, and inadequate number of viral copies (<131 copies/mL). Ambar Raphael negative result must be combined with clinical observations, patient history, and epidemiological information. The expected result is Negative.  Fact Sheet for Patients:  PinkCheek.be  Fact Sheet for Healthcare Providers:  GravelBags.it  This test is no t yet approved or cleared by the [place] FDA and  has been authorized for detection and/or  diagnosis of SARS-CoV-2 by FDA under an Emergency Use Authorization (EUA). This EUA will remain  in effect (meaning this test can be used) for the duration of the COVID-19 declaration under Section 564(b)(1) of the Act, 21 U.S.C. section 360bbb-3(b)(1), unless the authorization is terminated or revoked sooner.     Influenza Ngozi Alvidrez by PCR NEGATIVE NEGATIVE Final   Influenza B by PCR NEGATIVE NEGATIVE Final    Comment: (NOTE) The Xpert Xpress SARS-CoV-2/FLU/RSV assay is intended as an aid in  the diagnosis of influenza from Nasopharyngeal swab specimens and  should not be used as Edgel Degnan sole basis for treatment. Nasal washings and  aspirates are unacceptable for Xpert Xpress SARS-CoV-2/FLU/RSV  testing.  Fact Sheet for Patients: PinkCheek.be  Fact Sheet for Healthcare Providers: GravelBags.it  This test is not yet approved or cleared by the [place] FDA and  has been authorized for detection and/or diagnosis of SARS-CoV-2 by  FDA under an Emergency Use Authorization (EUA). This EUA will remain  in effect (meaning this test can be used)  for the duration of the  Covid-19 declaration under Section 564(b)(1) of the Act, 21  U.S.C. section 360bbb-3(b)(1), unless the authorization is  terminated or revoked. Performed at [facility], [place] [address] [place], [place] [address]   Blood Culture (routine x 2)     Status: Abnormal   Collection Time: 06/03/20 10:30 AM   Specimen: BLOOD LEFT ARM  Result Value Ref Range Status   Specimen Description   Final    BLOOD LEFT ARM Performed at [facility] [address], [place], [place] [address]    Special Requests   Final    BOTTLES DRAWN AEROBIC AND ANAEROBIC Blood Culture adequate volume Performed at [facility] [address] [place], [place] [address]    Culture  Setup Time   Final    ANAEROBIC BOTTLE ONLY GRAM POSITIVE  [facility] CRITICAL RESULT CALLED TO, READ BACK BY AND VERIFIED WITH: PHARMD M HICKS [id number] AT 14 AM BY CM    Culture (Marcha Licklider)  Final    STAPHYLOCOCCUS EPIDERMIDIS THE SIGNIFICANCE OF ISOLATING THIS ORGANISM FROM Lilyonna Steidle SINGLE SET OF BLOOD CULTURES WHEN MULTIPLE SETS ARE DRAWN IS UNCERTAIN. PLEASE NOTIFY THE MICROBIOLOGY DEPARTMENT WITHIN ONE WEEK IF SPECIATION AND SENSITIVITIES ARE REQUIRED. Performed at [facility], [place] [address], [place], [place] [address]    Report Status 06/06/2020 FINAL  Final  Blood Culture ID Panel (Reflexed)     Status: Abnormal   Collection Time: 06/03/20 10:30 AM  Result Value Ref Range Status   Enterococcus faecalis NOT DETECTED NOT DETECTED Final   Enterococcus Faecium NOT DETECTED NOT DETECTED Final   Listeria monocytogenes NOT DETECTED NOT DETECTED Final   Staphylococcus species DETECTED (Rheana Casebolt) NOT DETECTED Final    Comment: CRITICAL RESULT CALLED TO, READ BACK BY AND VERIFIED WITH: PHARMD M HICKS [id number] AT 855 BY CM    Staphylococcus aureus (BCID) NOT DETECTED NOT DETECTED Final   Staphylococcus epidermidis DETECTED (Leilah Polimeni) NOT DETECTED Final    Comment: Methicillin (oxacillin) resistant coagulase negative staphylococcus. Possible blood culture contaminant (unless isolated from more than one blood culture draw or clinical case suggests pathogenicity). No antibiotic treatment is indicated for blood  culture contaminants. CRITICAL RESULT CALLED TO, READ BACK BY AND VERIFIED WITH: PHARMD M HICKS [id number] AT 34 AM BY CM    Staphylococcus lugdunensis NOT DETECTED NOT DETECTED Final   Streptococcus species NOT DETECTED NOT DETECTED Final   Streptococcus agalactiae NOT DETECTED NOT DETECTED Final   Streptococcus pneumoniae NOT DETECTED NOT DETECTED Final   Streptococcus pyogenes NOT DETECTED NOT DETECTED Final   Chane Cowden.calcoaceticus-baumannii NOT DETECTED NOT DETECTED Final   Bacteroides fragilis NOT DETECTED NOT DETECTED Final   Enterobacterales NOT DETECTED NOT DETECTED  Final   Enterobacter cloacae complex NOT DETECTED NOT DETECTED Final   Escherichia coli NOT DETECTED NOT DETECTED Final   Klebsiella aerogenes NOT DETECTED NOT DETECTED Final   Klebsiella oxytoca NOT DETECTED NOT DETECTED Final   Klebsiella pneumoniae NOT DETECTED NOT DETECTED Final   Proteus species NOT DETECTED NOT DETECTED Final   Salmonella species NOT DETECTED NOT DETECTED Final   Serratia marcescens NOT DETECTED NOT DETECTED Final   Haemophilus influenzae NOT DETECTED NOT DETECTED Final   Neisseria meningitidis NOT DETECTED NOT DETECTED Final   Pseudomonas aeruginosa NOT DETECTED NOT DETECTED Final   Stenotrophomonas maltophilia NOT DETECTED NOT DETECTED Final   Candida albicans NOT DETECTED NOT DETECTED Final   Candida auris NOT DETECTED NOT DETECTED Final   Candida  glabrata NOT DETECTED NOT DETECTED Final   Candida krusei NOT DETECTED NOT DETECTED Final   Candida parapsilosis NOT DETECTED NOT DETECTED Final   Candida tropicalis NOT DETECTED NOT DETECTED Final   Cryptococcus neoformans/gattii NOT DETECTED NOT DETECTED Final   Methicillin resistance mecA/C DETECTED (Shalva Rozycki) NOT DETECTED Final    Comment: CRITICAL RESULT CALLED TO, READ BACK BY AND VERIFIED WITH: PHARMD M HICKS [id number] AT 855 AM BY CM Performed at [facility], [address], [place], [place] [address]   Blood Culture (routine x 2)     Status: None   Collection Time: 06/03/20 10:45 AM   Specimen: BLOOD  Result Value Ref Range Status   Specimen Description   Final    BLOOD LEFT ANTECUBITAL Performed at [facility], [place] [address], [place], [place] [address]    Special Requests   Final    BOTTLES DRAWN AEROBIC AND ANAEROBIC Blood Culture adequate volume Performed at [facility] [address], [place], [place] [address]    Culture   Final    NO GROWTH 5 DAYS Performed at [facility], [place] [address] [place], [place] [address]    Report Status 06/08/2020 FINAL  Final          Radiology Studies: No results found.      Scheduled Meds: . cholecalciferol  50 mcg Oral Daily  . clopidogrel  75 mg Oral Q breakfast  . donepezil  10 mg Oral QHS  . feeding supplement  1 Container Oral BID BM  . Gerhardt's butt cream   Topical BID  . [START ON 06/12/2020] hydrocerin   Topical BID  . magnesium oxide  400 mg Oral Daily  . metoprolol succinate  25 mg Oral Daily  . multivitamin with minerals  1 tablet Oral QPM  . nystatin   Topical BID  . potassium chloride  40 mEq Oral Q4H  . rivaroxaban  20 mg Oral QPM  . saccharomyces boulardii  250 mg Oral BID  . triamcinolone ointment   Topical BID  . vitamin B-12  1,000 mcg Oral QPM  . vitamin C  250 mg Oral QPM   Continuous Infusions:   LOS: 8 days    Time spent: over 30 min    Fayrene Helper, MD [facility]   To contact the attending provider between 7A-7P or the covering provider during after hours 7P-7A, please log into the web site [email or web address] and access using universal [place] password for that web site. If you do not have the password, please call the hospital operator.  06/11/2020, 2:15 PM

## 2020-06-12 ENCOUNTER — Inpatient Hospital Stay (HOSPITAL_COMMUNITY): Payer: Medicare PPO

## 2020-06-12 DIAGNOSIS — R609 Edema, unspecified: Secondary | ICD-10-CM | POA: Diagnosis not present

## 2020-06-12 DIAGNOSIS — L03312 Cellulitis of back [any part except buttock]: Secondary | ICD-10-CM | POA: Diagnosis not present

## 2020-06-12 LAB — MAGNESIUM: Magnesium: 1.9 mg/dL (ref 1.7–2.4)

## 2020-06-12 LAB — CBC WITH DIFFERENTIAL/PLATELET
Abs Immature Granulocytes: 0.09 10*3/uL — ABNORMAL HIGH (ref 0.00–0.07)
Basophils Absolute: 0 10*3/uL (ref 0.0–0.1)
Basophils Relative: 0 %
Eosinophils Absolute: 0.4 10*3/uL (ref 0.0–0.5)
Eosinophils Relative: 3 %
HCT: 23.7 % — ABNORMAL LOW (ref 36.0–46.0)
Hemoglobin: 7.6 g/dL — ABNORMAL LOW (ref 12.0–15.0)
Immature Granulocytes: 1 %
Lymphocytes Relative: 10 %
Lymphs Abs: 1.2 10*3/uL (ref 0.7–4.0)
MCH: 30 pg (ref 26.0–34.0)
MCHC: 32.1 g/dL (ref 30.0–36.0)
MCV: 93.7 fL (ref 80.0–100.0)
Monocytes Absolute: 0.8 10*3/uL (ref 0.1–1.0)
Monocytes Relative: 6 %
Neutro Abs: 9.6 10*3/uL — ABNORMAL HIGH (ref 1.7–7.7)
Neutrophils Relative %: 80 %
Platelets: 337 10*3/uL (ref 150–400)
RBC: 2.53 MIL/uL — ABNORMAL LOW (ref 3.87–5.11)
RDW: 17.2 % — ABNORMAL HIGH (ref 11.5–15.5)
WBC: 12 10*3/uL — ABNORMAL HIGH (ref 4.0–10.5)
nRBC: 0 % (ref 0.0–0.2)

## 2020-06-12 LAB — COMPREHENSIVE METABOLIC PANEL
ALT: 104 U/L — ABNORMAL HIGH (ref 0–44)
AST: 57 U/L — ABNORMAL HIGH (ref 15–41)
Albumin: 1.6 g/dL — ABNORMAL LOW (ref 3.5–5.0)
Alkaline Phosphatase: 74 U/L (ref 38–126)
Anion gap: 10 (ref 5–15)
BUN: 14 mg/dL (ref 8–23)
CO2: 27 mmol/L (ref 22–32)
Calcium: 8.4 mg/dL — ABNORMAL LOW (ref 8.9–10.3)
Chloride: 98 mmol/L (ref 98–111)
Creatinine, Ser: 0.58 mg/dL (ref 0.44–1.00)
GFR, Estimated: >60 mL/min (ref >60)
Glucose, Bld: 101 mg/dL — ABNORMAL HIGH (ref 70–99)
Potassium: 3.8 mmol/L (ref 3.5–5.1)
Sodium: 135 mmol/L (ref 135–145)
Total Bilirubin: 0.3 mg/dL (ref 0.3–1.2)
Total Protein: 4.4 g/dL — ABNORMAL LOW (ref 6.5–8.1)

## 2020-06-12 LAB — PHOSPHORUS: Phosphorus: 3 mg/dL (ref 2.5–4.6)

## 2020-06-12 LAB — BRAIN NATRIURETIC PEPTIDE: B Natriuretic Peptide: 202.7 pg/mL — ABNORMAL HIGH (ref 0.0–100.0)

## 2020-06-12 MED ORDER — LIP MEDEX EX OINT
TOPICAL_OINTMENT | CUTANEOUS | Status: DC | PRN
  Filled 2020-06-12: qty 7

## 2020-06-12 NOTE — Progress Notes (Signed)
Bilateral lower extremity venous duplex completed. Refer to "CV Proc" under chart review to view preliminary results.  06/12/2020 3:04 PM Kelby Aline., MHA, RVT, RDCS, RDMS

## 2020-06-12 NOTE — Progress Notes (Signed)
PROGRESS NOTE    Valerie [name]  IEP:[id number] DOB: 05/31/1946 DOA: 06/03/2020 PCP: [name], MD   Chief Complaint  Patient presents with  . Weakness  . Wound Check    Brief Narrative:  74 year old female with history of metastatic non-small cell lung cancer, adenocarcinoma, with brain and neck mets, with history of craniotomy for tumor resection, whole brain radiation, gamma knife, right thyroidectomy with radical neck dissection, currently on Tarceva started in July 6606, systolic CHF, recent MRSA bacteremia, recurrent UTIs including ESBL organisms came into the hospital with left-sided flank and back pain as well as failure to thrive, progressive weakness and immobility.  She has been having poor p.o. intake, nausea, vomiting, diarrhea for the past few weeks.  Assessment & Plan:   Active Problems:   Pressure injury of skin   Cellulitis   Rash and nonspecific skin eruption   Metastatic malignant neoplasm (HCC)   Failure to thrive in adult   Asymptomatic bacteriuria  Sepsis 2/2 Cellulitis  Tarceva Related Skin Rash: Vancomycin 10/15-10/18.  Meropenem 10/15-10/18.  Keflex 10/19 - present. ID c/s, appreciate recommendations -> concern for tarceva related rash, recommended completion of abx on 10/21.  If rash continues, discuss with derm/oncology.  5 days triamcinolone.  Eucerin.   1/2 blood cx with staph epi, likely contaminant Persistent rash, stable to mildly improved, continue to follow, will need outpatient derm follow up  Paroxysmal Huel Centola. Fib with RVR -Continue Xarelto, metoprolol - intermittent RVR, soft BP, continue to monitor for now  Hypokalemia  Hypophosphatemia  Hypomagnesemia replace, follow -> improved  Asymptomatic Bacteruria E. Coli and enterobacter on culture, she's asymptomatic, follow  Nausea  Vomiting  Diarrhea  Poor Appetite  Deconditioning  Failure to Thrive: Concern for related to tarceva Follow therapy, encourage PO intake OOBTC,  mobilize Diarrhea has improved off tarceva  Hx DVT Continue xarelto  HFrEF Most recent EF 25-30% with global hypokinesis, grade 1 diastolic dysfunction -Continu statin, Plavix, metoprolol -Recent cath done in June showed minimal CAD, 50% circumflex disease, it was felt that her cardiomyopathy was likely secondary toTagrissowhich has been discontinued at that time. -Appears euvolemic, received IV fluids in the ED and again 10/16.  Avoid further IV fluids, encourage p.o. intake  LLE Edema - on xarelto for anticoagulation, follow [place] - follow BNP  History of non-small cell lung cancer with mets to the brain and neck -Hold Tarceva per pharmacy in the setting of active infection.  Of note, during her prior hospitalization with MRSA bacteremia it has been continued. -I wonder whether Tarceva is playing Tiffiny Worthy major role in her rash, poor immune system, failure to thrive, poor appetite, diarrhea, nausea.  Patient's daughter is inclining towards stopping it.  -Discussed with Dr. Julien Nordmann on 10/18, recommends holding Tarceva right now and resuming it at [facility] lower dose on discharge, discussed with husband and patient as well, they are agreeable  Recent MRSA bacteremia -Per prior notes, patient did not want Eldor Conaway PICC line and received IV vancomycin while hospitalized for Dickey Caamano week and was discharged on linezolid.   Continue current antibiotics, ID to see as well  History of dementia -Mild, alert and oriented x4, continue home medications  Hyperlipidemia -Continue statin  Normocytic anemia -No bleeding, likely in the setting of chronic illness  Elevated LFTs Acute hepatitis panel -> Hep B Core Ab IgM positive repeat hep B core ab IgM negative, negative hep b surface Ag, negative hep B core total ab - hepatitis B e ab pending Hold APAP and  statin for now Follow   Goals of Care She's agreeable to discussing [place] with palliative  DVT prophylaxis: xarelto Code Status: DNR Family Communication:  none at bedside - 10/23 husband Disposition:  Pending decision regarding SNF vs home  Status is: Inpatient  Remains inpatient appropriate because:Inpatient level of care appropriate due to severity of illness   Dispo: The patient is from: Home              Anticipated d/c is to: Home              Anticipated d/c date is: 1 day              Patient currently is not medically stable to d/c.  Consultants:   ID  oncology   Procedures:   none  Antimicrobials:  Anti-infectives (From admission, onward)   Start     Dose/Rate Route Frequency Ordered Stop   06/08/20 1200  cephALEXin (KEFLEX) capsule 500 mg        500 mg Oral Every 6 hours 06/08/20 0800 06/11/20 0448   06/07/20 1545  cephALEXin (KEFLEX) capsule 250 mg        250 mg Oral  Once 06/07/20 1534 06/07/20 1612   06/07/20 1415  cephALEXin (KEFLEX) capsule 250 mg  Status:  Discontinued        250 mg Oral Once 06/07/20 1220 06/07/20 1534   06/07/20 1400  ceFAZolin (ANCEF) IVPB 1 g/50 mL premix  Status:  Discontinued        1 g 100 mL/hr over 30 Minutes Intravenous Every 8 hours 06/07/20 1202 06/07/20 1213   06/07/20 1315  cephALEXin (KEFLEX) 125 MG/5ML suspension 125 mg        125 mg Oral Once 06/07/20 1220 06/07/20 1409   06/07/20 1215  cephALEXin (KEFLEX) capsule 250 mg  Status:  Discontinued        250 mg Oral Once 06/07/20 1122 06/07/20 1154   06/05/20 2000  meropenem (MERREM) 1 g in sodium chloride 0.9 % 100 mL IVPB  Status:  Discontinued        1 g 200 mL/hr over 30 Minutes Intravenous Every 8 hours 06/05/20 1239 06/07/20 1010   06/04/20 0000  meropenem (MERREM) 1 g in sodium chloride 0.9 % 100 mL IVPB  Status:  Discontinued        1 g 200 mL/hr over 30 Minutes Intravenous Every 12 hours 06/03/20 1545 06/05/20 1239   06/03/20 2200  vancomycin (VANCOREADY) IVPB 500 mg/100 mL  Status:  Discontinued        500 mg 100 mL/hr over 60 Minutes Intravenous Every 12 hours 06/03/20 1545 06/07/20 1010   06/03/20 1730   vancomycin (VANCOCIN) IVPB 1000 mg/200 mL premix  Status:  Discontinued        1,000 mg 200 mL/hr over 60 Minutes Intravenous  Once 06/03/20 1644 06/03/20 1656   06/03/20 1730  meropenem (MERREM) 1 g in sodium chloride 0.9 % 100 mL IVPB  Status:  Discontinued        1 g 200 mL/hr over 30 Minutes Intravenous  Once 06/03/20 1644 06/03/20 1656   06/03/20 1215  vancomycin (VANCOCIN) IVPB 1000 mg/200 mL premix        1,000 mg 200 mL/hr over 60 Minutes Intravenous  Once 06/03/20 1200 06/03/20 1334   06/03/20 1200  meropenem (MERREM) 1 g in sodium chloride 0.9 % 100 mL IVPB        1 g 200 mL/hr over 30 Minutes Intravenous  Once 06/03/20 1159 06/03/20 1410     Subjective: No new complaints  Objective: Vitals:   06/11/20 1302 06/11/20 2022 06/12/20 0527 06/12/20 1353  BP: 110/70 (!) 109/59 111/78 (!) 93/57  Pulse: (!) 106 (!) 118 91 (!) 106  Resp: 19 (!) 22 18 19   Temp: 97.9 F (36.6 C) 99.7 F (37.6 C) 98.2 F (36.8 C) 97.7 F (36.5 C)  TempSrc: Oral Oral Oral Oral  SpO2: 97% 99% 98% 97%  Weight:        Intake/Output Summary (Last 24 hours) at 06/12/2020 1408 Last data filed at 06/12/2020 1358 Gross per 24 hour  Intake 720 ml  Output 1250 ml  Net -530 ml   Filed Weights   06/03/20 1456  Weight: 59 kg    Examination:  General: No acute distress. Cardiovascular: irregularly irregular Lungs: unlabored Abdomen: Soft, nontender, nondistended  Neurological: Alert and oriented 3. Moves all extremities 4 . Cranial nerves II through XII grossly intact. Skin: erythema to back, L>R side of back Extremities: LLE edema    Data Reviewed: I have personally reviewed following labs and imaging studies  CBC: Recent Labs  Lab 06/08/20 0412 06/08/20 0412 06/09/20 0355 06/10/20 0352 06/11/20 0500 06/11/20 1219 06/12/20 0345  WBC 12.5*  --  11.3* 10.6* 10.3  --  12.0*  NEUTROABS 10.4*  --  9.2* 8.0* 7.7  --  9.6*  HGB 8.3*   < > 8.4* 8.0* 7.6* 8.5* 7.6*  HCT 25.4*   < >  26.2* 24.9* 23.4* 26.1* 23.7*  MCV 92.7  --  94.2 92.6 92.5  --  93.7  PLT 335  --  351 345 362  --  337   < > = values in this interval not displayed.    Basic Metabolic Panel: Recent Labs  Lab 06/08/20 0412 06/09/20 0355 06/10/20 0352 06/11/20 0500 06/12/20 0345  NA 133* 133* 134* 137 135  K 3.6 3.2* 3.2* 3.2* 3.8  CL 103 102 103 103 98  CO2 24 24 24 25 27   GLUCOSE 100* 102* 107* 99 101*  BUN 10 16 16 12 14   CREATININE 0.49 0.62 0.51 0.48 0.58  CALCIUM 7.9* 7.7* 8.1* 8.3* 8.4*  MG 2.2 2.0 1.8 1.9 1.9  PHOS 2.3* 2.1* 2.7 2.7 3.0    GFR: Estimated Creatinine Clearance: 57.5 mL/min (by C-G formula based on SCr of 0.58 mg/dL).  Liver Function Tests: Recent Labs  Lab 06/08/20 0412 06/09/20 0355 06/10/20 0352 06/11/20 0500 06/12/20 0345  AST 256* 117* 73* 55* 57*  ALT 171* 146* 123* 97* 104*  ALKPHOS 73 73 69 68 74  BILITOT 0.6 0.4 0.3 0.5 0.3  PROT 4.1* 4.2* 4.4* 4.1* 4.4*  ALBUMIN 1.4* 1.5* 1.6* 1.5* 1.6*    CBG: No results for input(s): GLUCAP in the last 168 hours.   Recent Results (from the past 240 hour(s))  Urine culture     Status: Abnormal   Collection Time: 06/03/20 10:23 AM   Specimen: In/Out Cath Urine  Result Value Ref Range Status   Specimen Description   Final    IN/OUT CATH URINE Performed at [facility] [address], [place], [place] [address]    Special Requests   Final    NONE Performed at [facility], [place] [address] [place],  [address]    Culture (Almee Pelphrey)  Final    >=100,000 COLONIES/mL ESCHERICHIA COLI >=100,000 COLONIES/mL ENTEROBACTER CLOACAE    Report Status 06/06/2020 FINAL  Final   Organism  ID, Bacteria ESCHERICHIA COLI (Nikkolas Coomes)  Final   Organism ID, Bacteria ENTEROBACTER CLOACAE (Johncarlos Holtsclaw)  Final      Susceptibility   Enterobacter cloacae - MIC*    CEFAZOLIN >=64 RESISTANT Resistant     CIPROFLOXACIN 1 SENSITIVE Sensitive     GENTAMICIN <=1 SENSITIVE Sensitive     IMIPENEM 0.5 SENSITIVE  Sensitive     NITROFURANTOIN 64 INTERMEDIATE Intermediate     TRIMETH/SULFA <=20 SENSITIVE Sensitive     PIP/TAZO 16 SENSITIVE Sensitive     * >=100,000 COLONIES/mL ENTEROBACTER CLOACAE   Escherichia coli - MIC*    AMPICILLIN 4 SENSITIVE Sensitive     CEFAZOLIN <=4 SENSITIVE Sensitive     CEFTRIAXONE <=0.25 SENSITIVE Sensitive     CIPROFLOXACIN >=4 RESISTANT Resistant     GENTAMICIN >=16 RESISTANT Resistant     IMIPENEM <=0.25 SENSITIVE Sensitive     NITROFURANTOIN <=16 SENSITIVE Sensitive     TRIMETH/SULFA <=20 SENSITIVE Sensitive     AMPICILLIN/SULBACTAM <=2 SENSITIVE Sensitive     PIP/TAZO <=4 SENSITIVE Sensitive     * >=100,000 COLONIES/mL ESCHERICHIA COLI  Respiratory Panel by RT PCR (Flu Hassel Uphoff&B, Covid) - Urine, Catheterized     Status: None   Collection Time: 06/03/20 10:23 AM   Specimen: Urine, Catheterized; Nasopharyngeal  Result Value Ref Range Status   SARS Coronavirus 2 by RT PCR NEGATIVE NEGATIVE Final    Comment: (NOTE) SARS-CoV-2 target nucleic acids are NOT DETECTED.  The SARS-CoV-2 RNA is generally detectable in upper respiratoy specimens during the acute phase of infection. The lowest concentration of SARS-CoV-2 viral copies this assay can detect is 131 copies/mL. Mcgwire Dasaro negative result does not preclude SARS-Cov-2 infection and should not be used as the sole basis for treatment or other patient management decisions. Karalina Tift negative result may occur with  improper specimen collection/handling, submission of specimen other than nasopharyngeal swab, presence of viral mutation(s) within the areas targeted by this assay, and inadequate number of viral copies (<131 copies/mL). Michaelpaul Apo negative result must be combined with clinical observations, patient history, and epidemiological information. The expected result is Negative.  Fact Sheet for Patients:  PinkCheek.be  Fact Sheet for Healthcare Providers:  GravelBags.it  This  test is no t yet approved or cleared by the [place] FDA and  has been authorized for detection and/or diagnosis of SARS-CoV-2 by FDA under an Emergency Use Authorization (EUA). This EUA will remain  in effect (meaning this test can be used) for the duration of the COVID-19 declaration under Section 564(b)(1) of the Act, 21 U.S.C. section 360bbb-3(b)(1), unless the authorization is terminated or revoked sooner.     Influenza Salihah Peckham by PCR NEGATIVE NEGATIVE Final   Influenza B by PCR NEGATIVE NEGATIVE Final    Comment: (NOTE) The Xpert Xpress SARS-CoV-2/FLU/RSV assay is intended as an aid in  the diagnosis of influenza from Nasopharyngeal swab specimens and  should not be used as Sarie Stall sole basis for treatment. Nasal washings and  aspirates are unacceptable for Xpert Xpress SARS-CoV-2/FLU/RSV  testing.  Fact Sheet for Patients: PinkCheek.be  Fact Sheet for Healthcare Providers: GravelBags.it  This test is not yet approved or cleared by the [place] FDA and  has been authorized for detection and/or diagnosis of SARS-CoV-2 by  FDA under an Emergency Use Authorization (EUA). This EUA will remain  in effect (meaning this test can be used) for the duration of the  Covid-19 declaration under Section 564(b)(1) of the Act, 21  U.S.C. section 360bbb-3(b)(1), unless the authorization is  terminated or revoked. Performed at [facility], [place] [address] [place], [place] [address]   Blood Culture (routine x 2)     Status: Abnormal   Collection Time: 06/03/20 10:30 AM   Specimen: BLOOD LEFT ARM  Result Value Ref Range Status   Specimen Description   Final    BLOOD LEFT ARM Performed at [facility] [address] [place], [place] [address]    Special Requests   Final    BOTTLES DRAWN AEROBIC AND ANAEROBIC Blood Culture adequate volume Performed at [facility]  [address], [place], [place] [address]    Culture  Setup Time   Final    ANAEROBIC BOTTLE ONLY GRAM POSITIVE COCCI CRITICAL RESULT CALLED TO, READ BACK BY AND VERIFIED WITH: PHARMD M HICKS [id number] AT 27 AM BY CM    Culture (Eriyanna Kofoed)  Final    STAPHYLOCOCCUS EPIDERMIDIS THE SIGNIFICANCE OF ISOLATING THIS ORGANISM FROM Kierra Jezewski SINGLE SET OF BLOOD CULTURES WHEN MULTIPLE SETS ARE DRAWN IS UNCERTAIN. PLEASE NOTIFY THE MICROBIOLOGY DEPARTMENT WITHIN ONE WEEK IF SPECIATION AND SENSITIVITIES ARE REQUIRED. Performed at [facility], [place] [address] [place], [place] [address]    Report Status 06/06/2020 FINAL  Final  Blood Culture ID Panel (Reflexed)     Status: Abnormal   Collection Time: 06/03/20 10:30 AM  Result Value Ref Range Status   Enterococcus faecalis NOT DETECTED NOT DETECTED Final   Enterococcus Faecium NOT DETECTED NOT DETECTED Final   Listeria monocytogenes NOT DETECTED NOT DETECTED Final   Staphylococcus species DETECTED (Addiel Mccardle) NOT DETECTED Final    Comment: CRITICAL RESULT CALLED TO, READ BACK BY AND VERIFIED WITH: PHARMD M HICKS [id number] AT 855 BY CM    Staphylococcus aureus (BCID) NOT DETECTED NOT DETECTED Final   Staphylococcus epidermidis DETECTED (Vernisha Bacote) NOT DETECTED Final    Comment: Methicillin (oxacillin) resistant coagulase negative staphylococcus. Possible blood culture contaminant (unless isolated from more than one blood culture draw or clinical case suggests pathogenicity). No antibiotic treatment is indicated for blood  culture contaminants. CRITICAL RESULT CALLED TO, READ BACK BY AND VERIFIED WITH: PHARMD M HICKS [id number] AT 46 AM BY CM    Staphylococcus lugdunensis NOT DETECTED NOT DETECTED Final   Streptococcus species NOT DETECTED NOT DETECTED Final   Streptococcus agalactiae NOT DETECTED NOT DETECTED Final   Streptococcus pneumoniae NOT DETECTED NOT DETECTED Final   Streptococcus pyogenes NOT DETECTED NOT DETECTED Final   Bassel Gaskill.calcoaceticus-baumannii NOT DETECTED NOT DETECTED  Final   Bacteroides fragilis NOT DETECTED NOT DETECTED Final   Enterobacterales NOT DETECTED NOT DETECTED Final   Enterobacter cloacae complex NOT DETECTED NOT DETECTED Final   Escherichia coli NOT DETECTED NOT DETECTED Final   Klebsiella aerogenes NOT DETECTED NOT DETECTED Final   Klebsiella oxytoca NOT DETECTED NOT DETECTED Final   Klebsiella pneumoniae NOT DETECTED NOT DETECTED Final   Proteus species NOT DETECTED NOT DETECTED Final   Salmonella species NOT DETECTED NOT DETECTED Final   Serratia marcescens NOT DETECTED NOT DETECTED Final   Haemophilus influenzae NOT DETECTED NOT DETECTED Final   Neisseria meningitidis NOT DETECTED NOT DETECTED Final   Pseudomonas aeruginosa NOT DETECTED NOT DETECTED Final   Stenotrophomonas maltophilia NOT DETECTED NOT DETECTED Final   Candida albicans NOT DETECTED NOT DETECTED Final   Candida auris NOT DETECTED NOT DETECTED Final   Candida glabrata NOT DETECTED NOT DETECTED Final   Candida krusei NOT DETECTED NOT DETECTED Final   Candida parapsilosis NOT DETECTED NOT DETECTED Final  Candida tropicalis NOT DETECTED NOT DETECTED Final   Cryptococcus neoformans/gattii NOT DETECTED NOT DETECTED Final   Methicillin resistance mecA/C DETECTED (Shae Hinnenkamp) NOT DETECTED Final    Comment: CRITICAL RESULT CALLED TO, READ BACK BY AND VERIFIED WITH: PHARMD M HICKS [id number] AT 855 AM BY CM Performed at [facility], [address] [place], [place] [address]   Blood Culture (routine x 2)     Status: None   Collection Time: 06/03/20 10:45 AM   Specimen: BLOOD  Result Value Ref Range Status   Specimen Description   Final    BLOOD LEFT ANTECUBITAL Performed at [facility], [place] [address], [place], [place] [address]    Special Requests   Final    BOTTLES DRAWN AEROBIC AND ANAEROBIC Blood Culture adequate volume Performed at [facility] [address], [place], [place] [address]    Culture   Final    NO GROWTH 5 DAYS Performed at  [facility], [place] [address] [place], [place] [address]    Report Status 06/08/2020 FINAL  Final         Radiology Studies: [place] EKG SITE RITE  Result Date: 06/11/2020 If Site Rite image not attached, placement could not be confirmed due to current cardiac rhythm.       Scheduled Meds: . cholecalciferol  50 mcg Oral Daily  . clopidogrel  75 mg Oral Q breakfast  . donepezil  10 mg Oral QHS  . feeding supplement  1 Container Oral BID BM  . Gerhardt's butt cream   Topical BID  . hydrocerin   Topical BID  . magnesium oxide  400 mg Oral Daily  . metoprolol succinate  25 mg Oral Daily  . multivitamin with minerals  1 tablet Oral QPM  . nystatin   Topical BID  . rivaroxaban  20 mg Oral QPM  . saccharomyces boulardii  250 mg Oral BID  . sodium chloride flush  10-40 mL Intracatheter Q12H  . triamcinolone ointment   Topical BID  . vitamin B-12  1,000 mcg Oral QPM  . vitamin C  250 mg Oral QPM   Continuous Infusions:   LOS: 9 days    Time spent: over 30 min    Fayrene Helper, MD [facility]   To contact the attending provider between 7A-7P or the covering provider during after hours 7P-7A, please log into the web site [email or web address] and access using universal [place] password for that web site. If you do not have the password, please call the hospital operator.  06/12/2020, 2:08 PM

## 2020-06-13 DIAGNOSIS — R300 Dysuria: Secondary | ICD-10-CM | POA: Diagnosis not present

## 2020-06-13 DIAGNOSIS — L270 Generalized skin eruption due to drugs and medicaments taken internally: Secondary | ICD-10-CM | POA: Diagnosis not present

## 2020-06-13 DIAGNOSIS — D638 Anemia in other chronic diseases classified elsewhere: Secondary | ICD-10-CM | POA: Diagnosis not present

## 2020-06-13 DIAGNOSIS — I110 Hypertensive heart disease with heart failure: Secondary | ICD-10-CM | POA: Diagnosis not present

## 2020-06-13 DIAGNOSIS — Z902 Acquired absence of lung [part of]: Secondary | ICD-10-CM | POA: Diagnosis not present

## 2020-06-13 DIAGNOSIS — Z7401 Bed confinement status: Secondary | ICD-10-CM | POA: Diagnosis not present

## 2020-06-13 DIAGNOSIS — Z515 Encounter for palliative care: Secondary | ICD-10-CM | POA: Diagnosis not present

## 2020-06-13 DIAGNOSIS — R350 Frequency of micturition: Secondary | ICD-10-CM | POA: Diagnosis not present

## 2020-06-13 DIAGNOSIS — Z86718 Personal history of other venous thrombosis and embolism: Secondary | ICD-10-CM | POA: Diagnosis not present

## 2020-06-13 DIAGNOSIS — C7931 Secondary malignant neoplasm of brain: Secondary | ICD-10-CM | POA: Diagnosis not present

## 2020-06-13 DIAGNOSIS — A419 Sepsis, unspecified organism: Secondary | ICD-10-CM | POA: Diagnosis not present

## 2020-06-13 DIAGNOSIS — L03312 Cellulitis of back [any part except buttock]: Secondary | ICD-10-CM | POA: Diagnosis not present

## 2020-06-13 DIAGNOSIS — E440 Moderate protein-calorie malnutrition: Secondary | ICD-10-CM | POA: Insufficient documentation

## 2020-06-13 DIAGNOSIS — M25572 Pain in left ankle and joints of left foot: Secondary | ICD-10-CM | POA: Diagnosis not present

## 2020-06-13 DIAGNOSIS — N12 Tubulo-interstitial nephritis, not specified as acute or chronic: Secondary | ICD-10-CM | POA: Diagnosis not present

## 2020-06-13 DIAGNOSIS — M2550 Pain in unspecified joint: Secondary | ICD-10-CM | POA: Diagnosis not present

## 2020-06-13 DIAGNOSIS — M898X9 Other specified disorders of bone, unspecified site: Secondary | ICD-10-CM | POA: Diagnosis not present

## 2020-06-13 DIAGNOSIS — R54 Age-related physical debility: Secondary | ICD-10-CM | POA: Diagnosis not present

## 2020-06-13 DIAGNOSIS — R262 Difficulty in walking, not elsewhere classified: Secondary | ICD-10-CM | POA: Diagnosis not present

## 2020-06-13 DIAGNOSIS — I700 Atherosclerosis of aorta: Secondary | ICD-10-CM | POA: Diagnosis not present

## 2020-06-13 DIAGNOSIS — S93402A Sprain of unspecified ligament of left ankle, initial encounter: Secondary | ICD-10-CM | POA: Diagnosis not present

## 2020-06-13 DIAGNOSIS — J90 Pleural effusion, not elsewhere classified: Secondary | ICD-10-CM | POA: Diagnosis not present

## 2020-06-13 DIAGNOSIS — R339 Retention of urine, unspecified: Secondary | ICD-10-CM | POA: Diagnosis not present

## 2020-06-13 DIAGNOSIS — Z79899 Other long term (current) drug therapy: Secondary | ICD-10-CM | POA: Diagnosis not present

## 2020-06-13 DIAGNOSIS — R627 Adult failure to thrive: Secondary | ICD-10-CM | POA: Diagnosis not present

## 2020-06-13 DIAGNOSIS — D259 Leiomyoma of uterus, unspecified: Secondary | ICD-10-CM | POA: Diagnosis not present

## 2020-06-13 DIAGNOSIS — R110 Nausea: Secondary | ICD-10-CM | POA: Diagnosis not present

## 2020-06-13 DIAGNOSIS — M84572D Pathological fracture in neoplastic disease, left ankle, subsequent encounter for fracture with routine healing: Secondary | ICD-10-CM | POA: Diagnosis not present

## 2020-06-13 DIAGNOSIS — C7800 Secondary malignant neoplasm of unspecified lung: Secondary | ICD-10-CM | POA: Diagnosis not present

## 2020-06-13 DIAGNOSIS — R5381 Other malaise: Secondary | ICD-10-CM | POA: Diagnosis not present

## 2020-06-13 DIAGNOSIS — R109 Unspecified abdominal pain: Secondary | ICD-10-CM | POA: Diagnosis not present

## 2020-06-13 DIAGNOSIS — R1900 Intra-abdominal and pelvic swelling, mass and lump, unspecified site: Secondary | ICD-10-CM | POA: Diagnosis not present

## 2020-06-13 DIAGNOSIS — M6281 Muscle weakness (generalized): Secondary | ICD-10-CM | POA: Diagnosis not present

## 2020-06-13 DIAGNOSIS — R1084 Generalized abdominal pain: Secondary | ICD-10-CM | POA: Diagnosis not present

## 2020-06-13 DIAGNOSIS — Z85850 Personal history of malignant neoplasm of thyroid: Secondary | ICD-10-CM | POA: Diagnosis not present

## 2020-06-13 DIAGNOSIS — S72401A Unspecified fracture of lower end of right femur, initial encounter for closed fracture: Secondary | ICD-10-CM | POA: Diagnosis not present

## 2020-06-13 DIAGNOSIS — N390 Urinary tract infection, site not specified: Secondary | ICD-10-CM | POA: Diagnosis not present

## 2020-06-13 DIAGNOSIS — R21 Rash and other nonspecific skin eruption: Secondary | ICD-10-CM | POA: Diagnosis not present

## 2020-06-13 DIAGNOSIS — Z7901 Long term (current) use of anticoagulants: Secondary | ICD-10-CM | POA: Diagnosis not present

## 2020-06-13 DIAGNOSIS — I5042 Chronic combined systolic (congestive) and diastolic (congestive) heart failure: Secondary | ICD-10-CM | POA: Diagnosis not present

## 2020-06-13 DIAGNOSIS — C3432 Malignant neoplasm of lower lobe, left bronchus or lung: Secondary | ICD-10-CM | POA: Diagnosis present

## 2020-06-13 DIAGNOSIS — R52 Pain, unspecified: Secondary | ICD-10-CM | POA: Diagnosis not present

## 2020-06-13 DIAGNOSIS — C801 Malignant (primary) neoplasm, unspecified: Secondary | ICD-10-CM | POA: Diagnosis not present

## 2020-06-13 DIAGNOSIS — I480 Paroxysmal atrial fibrillation: Secondary | ICD-10-CM | POA: Diagnosis not present

## 2020-06-13 DIAGNOSIS — Z5111 Encounter for antineoplastic chemotherapy: Secondary | ICD-10-CM | POA: Diagnosis not present

## 2020-06-13 DIAGNOSIS — K5909 Other constipation: Secondary | ICD-10-CM | POA: Diagnosis not present

## 2020-06-13 DIAGNOSIS — R5383 Other fatigue: Secondary | ICD-10-CM | POA: Diagnosis not present

## 2020-06-13 DIAGNOSIS — M1990 Unspecified osteoarthritis, unspecified site: Secondary | ICD-10-CM | POA: Diagnosis not present

## 2020-06-13 DIAGNOSIS — R410 Disorientation, unspecified: Secondary | ICD-10-CM | POA: Diagnosis not present

## 2020-06-13 DIAGNOSIS — C3490 Malignant neoplasm of unspecified part of unspecified bronchus or lung: Secondary | ICD-10-CM | POA: Diagnosis not present

## 2020-06-13 DIAGNOSIS — C719 Malignant neoplasm of brain, unspecified: Secondary | ICD-10-CM | POA: Diagnosis not present

## 2020-06-13 DIAGNOSIS — I509 Heart failure, unspecified: Secondary | ICD-10-CM | POA: Diagnosis not present

## 2020-06-13 DIAGNOSIS — R600 Localized edema: Secondary | ICD-10-CM | POA: Diagnosis not present

## 2020-06-13 DIAGNOSIS — C3411 Malignant neoplasm of upper lobe, right bronchus or lung: Secondary | ICD-10-CM | POA: Diagnosis not present

## 2020-06-13 DIAGNOSIS — R8271 Bacteriuria: Secondary | ICD-10-CM | POA: Diagnosis not present

## 2020-06-13 DIAGNOSIS — F0390 Unspecified dementia without behavioral disturbance: Secondary | ICD-10-CM | POA: Diagnosis not present

## 2020-06-13 DIAGNOSIS — Z87440 Personal history of urinary (tract) infections: Secondary | ICD-10-CM | POA: Diagnosis not present

## 2020-06-13 DIAGNOSIS — R634 Abnormal weight loss: Secondary | ICD-10-CM | POA: Diagnosis not present

## 2020-06-13 DIAGNOSIS — R404 Transient alteration of awareness: Secondary | ICD-10-CM | POA: Diagnosis not present

## 2020-06-13 DIAGNOSIS — Z743 Need for continuous supervision: Secondary | ICD-10-CM | POA: Diagnosis not present

## 2020-06-13 DIAGNOSIS — Z923 Personal history of irradiation: Secondary | ICD-10-CM | POA: Diagnosis not present

## 2020-06-13 LAB — RESPIRATORY PANEL BY RT PCR (FLU A&B, COVID)
Influenza A by PCR: NEGATIVE
Influenza B by PCR: NEGATIVE
SARS Coronavirus 2 by RT PCR: NEGATIVE

## 2020-06-13 LAB — COMPREHENSIVE METABOLIC PANEL
ALT: 104 U/L — ABNORMAL HIGH (ref 0–44)
AST: 59 U/L — ABNORMAL HIGH (ref 15–41)
Albumin: 1.6 g/dL — ABNORMAL LOW (ref 3.5–5.0)
Alkaline Phosphatase: 72 U/L (ref 38–126)
Anion gap: 8 (ref 5–15)
BUN: 13 mg/dL (ref 8–23)
CO2: 28 mmol/L (ref 22–32)
Calcium: 8.3 mg/dL — ABNORMAL LOW (ref 8.9–10.3)
Chloride: 102 mmol/L (ref 98–111)
Creatinine, Ser: 0.63 mg/dL (ref 0.44–1.00)
GFR, Estimated: >60 mL/min (ref >60)
Glucose, Bld: 98 mg/dL (ref 70–99)
Potassium: 3.5 mmol/L (ref 3.5–5.1)
Sodium: 138 mmol/L (ref 135–145)
Total Bilirubin: 0.4 mg/dL (ref 0.3–1.2)
Total Protein: 4.5 g/dL — ABNORMAL LOW (ref 6.5–8.1)

## 2020-06-13 LAB — PHOSPHORUS: Phosphorus: 3.8 mg/dL (ref 2.5–4.6)

## 2020-06-13 LAB — CBC WITH DIFFERENTIAL/PLATELET
Abs Immature Granulocytes: 0.1 10*3/uL — ABNORMAL HIGH (ref 0.00–0.07)
Basophils Absolute: 0 10*3/uL (ref 0.0–0.1)
Basophils Relative: 1 %
Eosinophils Absolute: 0.5 10*3/uL (ref 0.0–0.5)
Eosinophils Relative: 5 %
HCT: 23.2 % — ABNORMAL LOW (ref 36.0–46.0)
Hemoglobin: 7.5 g/dL — ABNORMAL LOW (ref 12.0–15.0)
Immature Granulocytes: 1 %
Lymphocytes Relative: 12 %
Lymphs Abs: 1 10*3/uL (ref 0.7–4.0)
MCH: 30.5 pg (ref 26.0–34.0)
MCHC: 32.3 g/dL (ref 30.0–36.0)
MCV: 94.3 fL (ref 80.0–100.0)
Monocytes Absolute: 0.6 10*3/uL (ref 0.1–1.0)
Monocytes Relative: 6 %
Neutro Abs: 6.7 10*3/uL (ref 1.7–7.7)
Neutrophils Relative %: 75 %
Platelets: 327 10*3/uL (ref 150–400)
RBC: 2.46 MIL/uL — ABNORMAL LOW (ref 3.87–5.11)
RDW: 17.2 % — ABNORMAL HIGH (ref 11.5–15.5)
WBC: 8.9 10*3/uL (ref 4.0–10.5)
nRBC: 0 % (ref 0.0–0.2)

## 2020-06-13 LAB — MAGNESIUM: Magnesium: 2 mg/dL (ref 1.7–2.4)

## 2020-06-13 LAB — HEPATITIS B E ANTIBODY: Hep B E Ab: NEGATIVE

## 2020-06-13 MED ORDER — HYDROCERIN EX CREA
1.0000 | TOPICAL_CREAM | Freq: Two times a day (BID) | CUTANEOUS | 0 refills | Status: DC
Start: 2020-06-13 — End: 2021-04-18

## 2020-06-13 MED ORDER — ACETAMINOPHEN 500 MG PO TABS: 1000.0000 mg | ORAL_TABLET | Freq: Three times a day (TID) | ORAL | 0 refills | Status: DC | PRN

## 2020-06-13 MED ORDER — OXYCODONE HCL 5 MG PO TABS: 5.0000 mg | ORAL_TABLET | ORAL | 0 refills | Status: AC | PRN

## 2020-06-13 MED ORDER — GERHARDT'S BUTT CREAM: 1.0000 "application " | TOPICAL_CREAM | Freq: Two times a day (BID) | CUTANEOUS | 1 refills | Status: DC

## 2020-06-13 MED ORDER — NYSTATIN 100000 UNIT/GM EX POWD
Freq: Two times a day (BID) | CUTANEOUS | 0 refills | Status: DC
Start: 2020-06-13 — End: 2020-08-24

## 2020-06-13 NOTE — Progress Notes (Signed)
Occupational Therapy Treatment Patient Details Name: Valerie [name] MRN: [id number] DOB: Dec 30, 1945 Today's Date: 06/13/2020    History of present illness Pt is 74 yo female with hx including metastatic lung CA, adenocarcinoma with brain and neck mets, hx of craniotomy for tumor resection, whole brain radiation, R thyroidectomy with radical neck dissection, CHF, MRSA, and UTIs.  Pt presented to hospital with L sided flank/back pain as well as FTT and progressive weakness. Pt admitted with sepsis due to back cellulitis and possible UTI.   OT comments  Patient making slow progress today, and showed improved ability to feed herself at bed level and was willing to sit EOB with poor sitting balance, tolerating for 4-5 minutes before pt reported fatigue and need to return to supine.  Patient remains limited by profound weakness requiring Max As of 2 people for rolling and supine to sit and total assist of 2 people for sit to supine and scooting.  Additional limitations include back pain from wounds/lesions, and deficits noted below. Pt continues to demonstrate fair rehab potential and may benefit from continued skilled OT to increase safety and independence with ADLs and functional transfers to allow pt to return home safely and reduce caregiver burden and fall risk.   Follow Up Recommendations  SNF;Supervision/Assistance - 24 hour    Equipment Recommendations  None recommended by OT    Recommendations for Other Services      Precautions / Restrictions Precautions Precautions: Fall Precaution Comments: wounds - entire back excoriated/superficial wounds; non ambulatory for 1 year per pt Restrictions Weight Bearing Restrictions: No       Mobility Bed Mobility Overal bed mobility: Needs Assistance Bed Mobility: Sit to Supine;Sidelying to Sit   Sidelying to sit: Max assist;+2 for physical assistance   Sit to supine: Total assist;+2 for physical assistance   General bed mobility  comments: pt able to move LEs minimally on the bed and attempted to assist with upper body however very weak; utilized bed pads and bed functions for positioning  Transfers                 General transfer comment: Did not attempt. Pt declined and very weak at EOB with poor sitting balance.    Balance Overall balance assessment: Needs assistance   Sitting balance-Leahy Scale: Poor Sitting balance - Comments: Very kyphotic and unable to sit upright without Max Assist. Head flopped downward to lap. Rested on RT foream for much of sitting, and cued to sit at midline in attempt to engage core mm with pt declining due to fatigue and little mm recruitment observed or palpated. Postural control: Right lateral lean;Posterior lean Standing balance support: Bilateral upper extremity supported Standing balance-Leahy Scale: Zero Standing balance comment: Unable to stand today.                           ADL either performed or assessed with clinical judgement   ADL               Lower Body Bathing: Total assistance;+2 for physical assistance;+2 for safety/equipment;Bed level;Cueing for sequencing       Lower Body Dressing: Bed level;Total assistance               Functional mobility during ADLs: Total assistance;+2 for physical assistance General ADL Comments: Pt required Max As of 2 people to roll right and left for peri hygiene and care.  Also see mobility section.     Vision  Vision Assessment?: No apparent visual deficits Additional Comments: More alert today, making good eye contact, wearing glasses.   Perception     Praxis      Cognition Arousal/Alertness: Awake/alert Behavior During Therapy: Flat affect;Anxious Overall Cognitive Status: No family/caregiver present to determine baseline cognitive functioning                                 General Comments: Followed simple commands with increased time.  Fatigues quickly. Needs  encouragement to participate.        Exercises Other Exercises Other Exercises: Reinforced head retraction exercises: Pt instructed to push head back into pillow for 5 second count x5 reps. Pt able to demo back correctly with increased time and multimodal cues. Pt was encouraged to continue these exercises on her own ad lib.   Shoulder Instructions       General Comments      Pertinent Vitals/ Pain       Pain Assessment: Faces Faces Pain Scale: Hurts a little bit Pain Location: back Pain Descriptors / Indicators: Grimacing;Moaning Pain Intervention(s): Monitored during session;Limited activity within patient's tolerance;Repositioned  Home Living                                          Prior Functioning/Environment              Frequency  Min 2X/week        Progress Toward Goals  OT Goals(current goals can now be found in the care plan section)  Progress towards OT goals: Progressing toward goals  Acute Rehab OT Goals Patient Stated Goal: Improved ability to feed self noted at bed level, possibly due to higher level of alertness this session.  Plan Discharge plan remains appropriate    Co-evaluation                 AM-PAC OT "6 Clicks" Daily Activity     Outcome Measure   Help from another person eating meals?: A Little Help from another person taking care of personal grooming?: A Lot Help from another person toileting, which includes using toliet, bedpan, or urinal?: Total Help from another person bathing (including washing, rinsing, drying)?: Total Help from another person to put on and taking off regular upper body clothing?: Total Help from another person to put on and taking off regular lower body clothing?: Total 6 Click Score: 9    End of Session    OT Visit Diagnosis: Unsteadiness on feet (R26.81);Other abnormalities of gait and mobility (R26.89);Muscle weakness (generalized) (M62.81);Pain Pain - part of body:  (back)    Activity Tolerance Patient limited by fatigue;Patient limited by pain   Patient Left in bed;with call bell/phone within reach;with bed alarm set   Nurse Communication Mobility status        Time: 5053-9767 OT Time Calculation (min): 28 min  Charges: OT General Charges $OT Visit: 1 Visit OT Treatments $Self Care/Home Management : 8-22 mins $Therapeutic Activity: 8-22 mins  Anderson Malta, OT Acute Rehab Services Office: [phone] 06/13/2020    Julien Girt 06/13/2020, 11:27 AM

## 2020-06-13 NOTE — TOC Progression Note (Signed)
Transition of Care [facility]) - Progression Note    Patient Details  Name: Valerie [name] MRN: [id number] Date of Birth: 1946/03/20  [facility] of [facility] CM/SW Contact  Purcell Mouton, RN Phone Number: 06/13/2020, 1:32 PM  Clinical Narrative:    [facility] was approved by insurance for SNF. Pt will need rapid COVID test and her Chemo medications.    Expected Discharge Plan:  (SNF v Home) Barriers to Discharge: SNF Pending bed offer  Expected Discharge Plan and Services Expected Discharge Plan:  (SNF v Home)   Discharge Planning Services: CM Consult Post Acute Care Choice: [place] Living arrangements for the past 2 months: Single Family Home                                       Social Determinants of Health (SDOH) Interventions    Readmission Risk Interventions Readmission Risk Prevention Plan 02/18/2020  Transportation Screening Complete  PCP or Specialist Appt within 5-7 Days Complete  Home Care Screening Patient refused  Medication Review (RN CM) Complete  Some recent data might be hidden

## 2020-06-13 NOTE — Progress Notes (Signed)
Palliative care brief note  Chart reviewed and discussed with Dr. Florene Glen.  Ms. Alcorta is discharging today and would benefit from outpatient palliative care follow-up.  Family would prefer that this occur after discharge from skilled facility and she is enrolled in [facility].  I, therefore, called and placed referral to [facility] program to follow-up with her on her return home.  Micheline Rough, MD [place] [facility] Team [phone]  NO CHARGE NOTE

## 2020-06-13 NOTE — Discharge Summary (Addendum)
Physician Discharge Summary  Valerie [name] HKV:[id number] DOB: 16-Sep-1945 DOA: 06/03/2020  PCP: [name], MD  Admit date: 06/03/2020 Discharge date: 06/13/2020  Time spent: 40 minutes  Recommendations for Outpatient Follow-up:  1. Follow outpatient CBC/CMP 2. Follow with Dr. Julien Nordmann outpatient regarding tarceva dosing 3. Please arrange follow up with dermatology outpatient for back rash 4. Mildly tachycardic, continue metoprolol, follow outpatient  5. Continue wound care outpatient - frequent turns.  Wound care as noted below.  6. Follow LFT's outpatient -- hold tylenol and atorvastatin for now, follow for possibe resumption 7. Follow anemia outpatient  8. Follow volume status outpatient 9. Follow with palliative care after discharge from SNF  Discharge Diagnoses:  Active Problems:   Pressure injury of skin   Cellulitis   Rash and nonspecific skin eruption   Metastatic malignant neoplasm (HCC)   Failure to thrive in adult   Asymptomatic bacteriuria   Malnutrition of moderate degree   Discharge Condition: stable  Diet recommendation: regular  Filed Weights   06/03/20 1456  Weight: 59 kg    History of present illness:  74 year old female with history of metastatic non-small cell lung cancer, adenocarcinoma, with brain and neck mets, with history of craniotomy for tumor resection, whole brain radiation, gamma knife, right thyroidectomy with radical neck dissection, currently on Tarceva started in July 5643, systolic CHF, recent MRSA bacteremia, recurrent UTIs including ESBL organisms came into the hospital with left-sided flank and back pain as well as failure to thrive, progressive weakness and immobility. She has been having poor p.o. intake, nausea, vomiting, diarrhea for the past few weeks.  She was admitted and found to have extensive rash on her back with underlying cellulitis.  She was treated with antibiotics and her tarceva was stopped.  She was seen by  ID who felt her rash was most likely Valerie [name] known side effect related to tarceva.  She may need dermatology follow up outpatient.   See below for additional details  Hospital Course:  Sepsis 2/2 Cellulitis  Tarceva Related Skin Rash: Vancomycin 10/15-10/18.  Meropenem 10/15-10/18.  Keflex 10/19 - present. ID c/s, appreciate recommendations -> concern for tarceva related rash, recommended completion of abx on 10/21.  If rash continues, discuss with derm/oncology.  5 days triamcinolone.  Eucerin. Continue wound care as noted below   1/2 blood cx with staph epi, likely contaminant Persistent rash, improved (discussed with husband who agreed), continue to follow, will need outpatient derm follow up  Paroxysmal Valerie [name]. Fib with RVR -Continue Xarelto, metoprolol - mild sinus tach today, 105, she's asymptomatic, follow outpatient  Hypokalemia  Hypophosphatemia  Hypomagnesemia replace, follow -> improved  Asymptomatic Bacteruria E. Coli and enterobacter on culture, she's asymptomatic, follow  Nausea  Vomiting  Diarrhea  Poor Appetite  Deconditioning  Failure to Thrive: Concern for related to tarceva Follow therapy, encourage PO intake OOBTC, mobilize Diarrhea has improved off tarceva  Hx DVT Continue xarelto  HFrEF Most recent EF 25-30% with global hypokinesis, grade 1 diastolic dysfunction -Continu statin, Plavix, metoprolol -Recent cath done in June showed minimal CAD, 50% circumflex disease, it was felt that her cardiomyopathy was likely secondary toTagrissowhich has been discontinued at that time. -Appears euvolemic, received IV fluids in the ED and again 10/16. Avoid further IV fluids, encourage p.o. intake  LLE Edema - on xarelto for anticoagulation, follow [place] (negative for DVT) - follow BNP (elevated, but appears to be below previous values) - follow volume status outpatient with HF below - BP on soft side, hold  off on diuretics  History of non-small cell lung cancer  with mets to the brain and neck -Hold Tarceva per pharmacy in the setting of active infection. Of note, during her prior hospitalization with MRSA bacteremia it has been continued. -I wonder whether Tarceva is playing Valerie [name] major role in her rash, poor immune system, failure to thrive, poor appetite, diarrhea, nausea. Patient's daughter is inclining towards stopping it.  -Discussed with Valerie [name] 10/18, recommends holding Tarceva right now and resuming it at Valerie [name] lower dose on discharge, discussed with husband and patient as well, they are agreeable.  Plan to follow up with Dr. Julien Nordmann after discharge to determine which dose to resume (discussed with Dr. Julien Nordmann on day of discharge).  Recent MRSA bacteremia -Per prior notes, patient did not want Valerie [name] PICC line and received IV vancomycin while hospitalized for Valerie [name] week and was discharged on linezolid. Continue current antibiotics, ID to see as well  History of dementia -Mild, alert and oriented x4, continue home medications  Hyperlipidemia -Continue statin  Normocytic anemia -No bleeding, likely in the setting of chronic illness (labs suggestive of AOCD)  Elevated LFTs Acute hepatitis panel -> Hep B Core Ab IgM positive repeat hep B core ab IgM negative, negative hep b surface Ag, negative hep B core total ab, hepatitis B e ab negative Hold APAP and statin for now Follow outpatient  Nonobstructive CAD Continue plavix per cards, statin on hold Follow outpatient with cards  Goals of Care Plan to f/u with palliative after d/c from SNF  Procedures: LE [place] Summary:  RIGHT:  - There is no evidence of deep vein thrombosis in the lower extremity.    - No cystic structure found in the popliteal fossa.    LEFT:  - There is no evidence of deep vein thrombosis in the lower extremity.    - No cystic structure found in the popliteal fossa.  Consultations:  ID  oncology  Discharge Exam: Vitals:   06/13/20 0455 06/13/20 1305    BP: 114/62 (!) 98/55  Pulse: 96 97  Resp: 20 18  Temp: 98.3 F (36.8 C) 98.2 F (36.8 C)  SpO2: 97% 100%   No new complaints Feels ok Discussed poc with husband  General: No acute distress. Cardiovascular: mild tachycardia, regular Lungs: unlabored Abdomen: Soft, nontender, nondistended  Neurological: Alert and oriented 3. Moves all extremities 4. Cranial nerves II through XII grossly intact. Skin: extensive erythema to back, improving, peeling skinimproving Extremities: No clubbing or cyanosis. Mild LE edema, L>R       Discharge Instructions   Discharge Instructions    Call MD for:  difficulty breathing, headache or visual disturbances   Complete by: As directed    Call MD for:  extreme fatigue   Complete by: As directed    Call MD for:  hives   Complete by: As directed    Call MD for:  persistant dizziness or light-headedness   Complete by: As directed    Call MD for:  persistant nausea and vomiting   Complete by: As directed    Call MD for:  redness, tenderness, or signs of infection (pain, swelling, redness, odor or green/yellow discharge around incision site)   Complete by: As directed    Call MD for:  severe uncontrolled pain   Complete by: As directed    Call MD for:  temperature >100.4   Complete by: As directed    Diet - low sodium heart healthy   Complete by: As directed  Discharge instructions   Complete by: As directed    You were seen for cellulitis (skin infection) of the back and Hope Brandenburger rash which is thought to be related to tarceva.  You've improved since your hospitalization.  We'll discharge you today with plans to continue your wound care outpatient.    You should follow up outpatient with dermatology for your rash.  I'll place that in the discharge paperwork to the SNF.  You should follow up with Dr. Julien Nordmann regarding your tarceva, he'll decrease the dose as an outpatient.   Your heart rate was Justino Boze little fast at times, but there's little room  to increase the dose with your blood pressure.  You should follow up with cardiology as an outpatient.    You should follow up with palliative care as an outpatient.   Return for new, recurrent, or worsening symptoms.   Please ask your PCP to request records from this hospitalization so they know what was done and what the next steps will be.   Discharge wound care:   Complete by: As directed    Cleanse back and buttocks and thighs with normal saline and pat dry.  Apply eucerin twice daily.  Turn patient frequently.  Continue nystatin powder to intertriginous folds (under breast and in groin).  Continue gerhardts butt cream to buttocks.   Increase activity slowly   Complete by: As directed      Allergies as of 06/13/2020   No Active Allergies     Medication List    STOP taking these medications   acetaminophen 500 MG tablet Commonly known as: TYLENOL   atorvastatin 80 MG tablet Commonly known as: LIPITOR   erlotinib 150 MG tablet Commonly known as: TARCEVA     TAKE these medications   clopidogrel 75 MG tablet Commonly known as: PLAVIX Take 1 tablet (75 mg total) by mouth daily with breakfast.   D3 PO Take 1 capsule by mouth daily.   diphenhydrAMINE 25 MG tablet Commonly known as: BENADRYL Take 25 mg by mouth every 6 (six) hours as needed for allergies.   donepezil 10 MG tablet Commonly known as: ARICEPT Take 10 mg by mouth at bedtime.   Gerhardt's butt cream Crea Apply 1 application topically 2 (two) times daily.   hydrocerin Crea Apply 1 application topically 2 (two) times daily. To back and buttocks.   loperamide 2 MG tablet Commonly known as: IMODIUM Alasha Mcguinness-D Take 4 mg by mouth in the morning and at bedtime.   MAGNESIUM PO Take 1 tablet by mouth daily.   metoprolol succinate 25 MG 24 hr tablet Commonly known as: TOPROL-XL Take 1 tablet (25 mg total) by mouth daily.   Multi-Vitamins Tabs Take 1 tablet by mouth every evening.   nystatin powder Commonly  known as: MYCOSTATIN/NYSTOP Apply topically 2 (two) times daily. To breast folds and groin   oxyCODONE 5 MG immediate release tablet Commonly known as: Oxy IR/ROXICODONE Take 1 tablet (5 mg total) by mouth every 4 (four) hours as needed for up to 3 days for severe pain.   potassium chloride SA 20 MEQ tablet Commonly known as: KLOR-CON Take 20 mEq by mouth daily.   prochlorperazine 10 MG tablet Commonly known as: COMPAZINE Take 1 tablet (10 mg total) by mouth every 6 (six) hours as needed for nausea or vomiting.   saccharomyces boulardii 250 MG capsule Commonly known as: Florastor Take 1 capsule (250 mg total) by mouth 2 (two) times daily.   vitamin B-12 1000 MCG tablet  Commonly known as: CYANOCOBALAMIN Take 1,000 mcg by mouth every evening.   vitamin C 100 MG tablet Take 100 mg by mouth every evening. Gummie   Xarelto 20 MG Tabs tablet Generic drug: rivaroxaban Take 1 tablet (20 mg total) by mouth every evening.   zinc oxide 11.3 % Crea cream Commonly known as: BALMEX Apply 1 application topically as needed (lesions/buttocks).            Discharge Care Instructions  (From admission, onward)         Start     Ordered   06/13/20 0000  Discharge wound care:       Comments: Cleanse back and buttocks and thighs with normal saline and pat dry.  Apply eucerin twice daily.  Turn patient frequently.  Continue nystatin powder to intertriginous folds (under breast and in groin).  Continue gerhardts butt cream to buttocks.   06/13/20 1545         No Active Allergies  Contact information for after-discharge care    Destination    [facility] Preferred SNF .   Service: Skilled Nursing Contact information: [address] [place] [phone]                   The results of significant diagnostics from this hospitalization (including imaging, microbiology, ancillary and laboratory) are listed below for reference.     Significant Diagnostic Studies: CT Angio Chest PE W/Cm &/Or Wo Cm  Result Date: 06/03/2020 CLINICAL DATA:  High probability of pulmonary embolus. Left-sided abdominal pain. EXAM: CT ANGIOGRAPHY CHEST CT ABDOMEN AND PELVIS WITH CONTRAST TECHNIQUE: Multidetector CT imaging of the chest was performed using the standard protocol during bolus administration of intravenous contrast. Multiplanar CT image reconstructions and MIPs were obtained to evaluate the vascular anatomy. Multidetector CT imaging of the abdomen and pelvis was performed using the standard protocol during bolus administration of intravenous contrast. CONTRAST:  155mL OMNIPAQUE IOHEXOL 350 MG/ML SOLN COMPARISON:  April 26, 2020. FINDINGS: CTA CHEST FINDINGS Cardiovascular: Satisfactory opacification of the pulmonary arteries to the segmental level. No evidence of pulmonary embolism. Mild cardiomegaly is noted. No pericardial effusion. Atherosclerosis of thoracic aorta is noted without aneurysm formation. Mediastinum/Nodes: No enlarged mediastinal, hilar, or axillary lymph nodes. Thyroid gland, trachea, and esophagus demonstrate no significant findings. Lungs/Pleura: No pneumothorax is noted. Status post left lower lobectomy. Scarring and other postsurgical changes are noted posteriorly. Small left pleural effusion is noted. Musculoskeletal: Postsurgical changes are seen involving the left ribs. Moderate thoracic kyphosis is noted. No acute osseous abnormality is noted. Review of the MIP images confirms the above findings. CT ABDOMEN and PELVIS FINDINGS Hepatobiliary: No focal liver abnormality is seen. No gallstones, gallbladder wall thickening, or biliary dilatation. Pancreas: Unremarkable. No pancreatic ductal dilatation or surrounding inflammatory changes. Spleen: Normal in size without focal abnormality. Adrenals/Urinary Tract: Adrenal glands are unremarkable. Kidneys are normal, without renal calculi, focal lesion, or hydronephrosis.  Bladder is unremarkable. Stomach/Bowel: Stomach is within normal limits. Appendix appears normal. No evidence of bowel wall thickening, distention, or inflammatory changes. Vascular/Lymphatic: Aortic atherosclerosis. No enlarged abdominal or pelvic lymph nodes. Reproductive: At least 2 uterine fibroids are noted. No adnexal abnormality is noted. Other: No abdominal wall hernia or abnormality. No abdominopelvic ascites. Musculoskeletal: No acute or significant osseous findings. Review of the MIP images confirms the above findings. IMPRESSION: 1. No definite evidence of pulmonary embolus. 2. Status post left lower lobectomy with postsurgical changes seen posteriorly. 3. Small left pleural effusion is  noted. 4. Aortic atherosclerosis. 5. At least 2 uterine fibroids are noted. 6. No acute abnormality seen in the abdomen or pelvis. Aortic Atherosclerosis (ICD10-I70.0). Electronically Signed   By: Marijo Conception M.D.   On: 06/03/2020 13:43   CT Abdomen Pelvis W Contrast  Result Date: 06/03/2020 CLINICAL DATA:  High probability of pulmonary embolus. Left-sided abdominal pain. EXAM: CT ANGIOGRAPHY CHEST CT ABDOMEN AND PELVIS WITH CONTRAST TECHNIQUE: Multidetector CT imaging of the chest was performed using the standard protocol during bolus administration of intravenous contrast. Multiplanar CT image reconstructions and MIPs were obtained to evaluate the vascular anatomy. Multidetector CT imaging of the abdomen and pelvis was performed using the standard protocol during bolus administration of intravenous contrast. CONTRAST:  175mL OMNIPAQUE IOHEXOL 350 MG/ML SOLN COMPARISON:  April 26, 2020. FINDINGS: CTA CHEST FINDINGS Cardiovascular: Satisfactory opacification of the pulmonary arteries to the segmental level. No evidence of pulmonary embolism. Mild cardiomegaly is noted. No pericardial effusion. Atherosclerosis of thoracic aorta is noted without aneurysm formation. Mediastinum/Nodes: No enlarged mediastinal,  hilar, or axillary lymph nodes. Thyroid gland, trachea, and esophagus demonstrate no significant findings. Lungs/Pleura: No pneumothorax is noted. Status post left lower lobectomy. Scarring and other postsurgical changes are noted posteriorly. Small left pleural effusion is noted. Musculoskeletal: Postsurgical changes are seen involving the left ribs. Moderate thoracic kyphosis is noted. No acute osseous abnormality is noted. Review of the MIP images confirms the above findings. CT ABDOMEN and PELVIS FINDINGS Hepatobiliary: No focal liver abnormality is seen. No gallstones, gallbladder wall thickening, or biliary dilatation. Pancreas: Unremarkable. No pancreatic ductal dilatation or surrounding inflammatory changes. Spleen: Normal in size without focal abnormality. Adrenals/Urinary Tract: Adrenal glands are unremarkable. Kidneys are normal, without renal calculi, focal lesion, or hydronephrosis. Bladder is unremarkable. Stomach/Bowel: Stomach is within normal limits. Appendix appears normal. No evidence of bowel wall thickening, distention, or inflammatory changes. Vascular/Lymphatic: Aortic atherosclerosis. No enlarged abdominal or pelvic lymph nodes. Reproductive: At least 2 uterine fibroids are noted. No adnexal abnormality is noted. Other: No abdominal wall hernia or abnormality. No abdominopelvic ascites. Musculoskeletal: No acute or significant osseous findings. Review of the MIP images confirms the above findings. IMPRESSION: 1. No definite evidence of pulmonary embolus. 2. Status post left lower lobectomy with postsurgical changes seen posteriorly. 3. Small left pleural effusion is noted. 4. Aortic atherosclerosis. 5. At least 2 uterine fibroids are noted. 6. No acute abnormality seen in the abdomen or pelvis. Aortic Atherosclerosis (ICD10-I70.0). Electronically Signed   By: Marijo Conception M.D.   On: 06/03/2020 13:43   DG Chest Port 1 View  Result Date: 06/03/2020 CLINICAL DATA:  Sepsis. EXAM: PORTABLE  CHEST 1 VIEW COMPARISON:  March 22, 2020.  April 26, 2020. FINDINGS: Stable cardiomegaly. No pneumothorax is noted. Right lung is clear. Mild left pleural effusion is noted with associated left basilar atelectasis or infiltrate. Postsurgical changes are noted in the left hilar region. Old left rib fractures are noted. IMPRESSION: Mild left pleural effusion is noted with associated left basilar atelectasis or infiltrate. No pneumothorax is noted. Electronically Signed   By: Marijo Conception M.D.   On: 06/03/2020 11:26   VAS [place] LOWER EXTREMITY VENOUS (DVT)  Result Date: 06/12/2020  Lower Venous DVTStudy Indications: Edema.  Comparison Study: No prior study Performing Technologist: Maudry Mayhew MHA, RDMS, RVT, RDCS  Examination Guidelines: Angles Trevizo complete evaluation includes B-mode imaging, spectral Doppler, color Doppler, and power Doppler as needed of all accessible portions of each vessel. Bilateral testing is considered an integral part  of Ardie Mclennan complete examination. Limited examinations for reoccurring indications may be performed as noted. The reflux portion of the exam is performed with the patient in reverse Trendelenburg.  +---------+---------------+---------+-----------+----------+--------------+ RIGHT    CompressibilityPhasicitySpontaneityPropertiesThrombus Aging +---------+---------------+---------+-----------+----------+--------------+ CFV      Full           Yes      Yes                                 +---------+---------------+---------+-----------+----------+--------------+ SFJ      Full                                                        +---------+---------------+---------+-----------+----------+--------------+ FV Prox  Full                                                        +---------+---------------+---------+-----------+----------+--------------+ FV Mid   Full                                                         +---------+---------------+---------+-----------+----------+--------------+ FV DistalFull                                                        +---------+---------------+---------+-----------+----------+--------------+ PFV      Full                                                        +---------+---------------+---------+-----------+----------+--------------+ POP      Full           Yes      Yes                                 +---------+---------------+---------+-----------+----------+--------------+ PTV      Full                                                        +---------+---------------+---------+-----------+----------+--------------+ PERO     Full                                                        +---------+---------------+---------+-----------+----------+--------------+   +---------+---------------+---------+-----------+----------+--------------+ LEFT     CompressibilityPhasicitySpontaneityPropertiesThrombus Aging +---------+---------------+---------+-----------+----------+--------------+ CFV      Full  Yes      Yes                                 +---------+---------------+---------+-----------+----------+--------------+ SFJ      Full                                                        +---------+---------------+---------+-----------+----------+--------------+ FV Prox  Full                                                        +---------+---------------+---------+-----------+----------+--------------+ FV Mid   Full                                                        +---------+---------------+---------+-----------+----------+--------------+ FV DistalFull                                                        +---------+---------------+---------+-----------+----------+--------------+ PFV      Full                                                         +---------+---------------+---------+-----------+----------+--------------+ POP      Full           Yes      Yes                                 +---------+---------------+---------+-----------+----------+--------------+ PTV      Full                                                        +---------+---------------+---------+-----------+----------+--------------+ PERO     Full                                                        +---------+---------------+---------+-----------+----------+--------------+     Summary: RIGHT: - There is no evidence of deep vein thrombosis in the lower extremity.  - No cystic structure found in the popliteal fossa.  LEFT: - There is no evidence of deep vein thrombosis in the lower extremity.  - No cystic structure found in the popliteal fossa.  *See table(s) above for measurements and observations.    Preliminary    [place] EKG SITE  RITE  Result Date: 06/11/2020 If Site Rite image not attached, placement could not be confirmed due to current cardiac rhythm.   Microbiology: Recent Results (from the past 240 hour(s))  Respiratory Panel by RT PCR (Flu Bernerd Terhune&B, Covid) - Nasopharyngeal Swab     Status: None   Collection Time: 06/13/20  1:46 PM   Specimen: Nasopharyngeal Swab  Result Value Ref Range Status   SARS Coronavirus 2 by RT PCR NEGATIVE NEGATIVE Final    Comment: (NOTE) SARS-CoV-2 target nucleic acids are NOT DETECTED.  The SARS-CoV-2 RNA is generally detectable in upper respiratoy specimens during the acute phase of infection. The lowest concentration of SARS-CoV-2 viral copies this assay can detect is 131 copies/mL. Jahzier Villalon negative result does not preclude SARS-Cov-2 infection and should not be used as the sole basis for treatment or other patient management decisions. Yatziri Wainwright negative result may occur with  improper specimen collection/handling, submission of specimen other than nasopharyngeal swab, presence of viral mutation(s) within the areas  targeted by this assay, and inadequate number of viral copies (<131 copies/mL). Aydyn Testerman negative result must be combined with clinical observations, patient history, and epidemiological information. The expected result is Negative.  Fact Sheet for Patients:  PinkCheek.be  Fact Sheet for Healthcare Providers:  GravelBags.it  This test is no t yet approved or cleared by the [place] FDA and  has been authorized for detection and/or diagnosis of SARS-CoV-2 by FDA under an Emergency Use Authorization (EUA). This EUA will remain  in effect (meaning this test can be used) for the duration of the COVID-19 declaration under Section 564(b)(1) of the Act, 21 U.S.C. section 360bbb-3(b)(1), unless the authorization is terminated or revoked sooner.     Influenza Jynesis Nakamura by PCR NEGATIVE NEGATIVE Final   Influenza B by PCR NEGATIVE NEGATIVE Final    Comment: (NOTE) The Xpert Xpress SARS-CoV-2/FLU/RSV assay is intended as an aid in  the diagnosis of influenza from Nasopharyngeal swab specimens and  should not be used as Athaliah Baumbach sole basis for treatment. Nasal washings and  aspirates are unacceptable for Xpert Xpress SARS-CoV-2/FLU/RSV  testing.  Fact Sheet for Patients: PinkCheek.be  Fact Sheet for Healthcare Providers: GravelBags.it  This test is not yet approved or cleared by the [place] FDA and  has been authorized for detection and/or diagnosis of SARS-CoV-2 by  FDA under an Emergency Use Authorization (EUA). This EUA will remain  in effect (meaning this test can be used) for the duration of the  Covid-19 declaration under Section 564(b)(1) of the Act, 21  U.S.C. section 360bbb-3(b)(1), unless the authorization is  terminated or revoked. Performed at [facility], [place] [address], [place], [place] [address]      Labs: Basic Metabolic Panel: Recent Labs    Lab 06/09/20 0355 06/10/20 0352 06/11/20 0500 06/12/20 0345 06/13/20 0626  NA 133* 134* 137 135 138  K 3.2* 3.2* 3.2* 3.8 3.5  CL 102 103 103 98 102  CO2 24 24 25 27 28   GLUCOSE 102* 107* 99 101* 98  BUN 16 16 12 14 13   CREATININE 0.62 0.51 0.48 0.58 0.63  CALCIUM 7.7* 8.1* 8.3* 8.4* 8.3*  MG 2.0 1.8 1.9 1.9 2.0  PHOS 2.1* 2.7 2.7 3.0 3.8   Liver Function Tests: Recent Labs  Lab 06/09/20 0355 06/10/20 0352 06/11/20 0500 06/12/20 0345 06/13/20 0626  AST 117* 73* 55* 57* 59*  ALT 146* 123* 97* 104* 104*  ALKPHOS 73 69 68 74 72  BILITOT 0.4 0.3 0.5 0.3  0.4  PROT 4.2* 4.4* 4.1* 4.4* 4.5*  ALBUMIN 1.5* 1.6* 1.5* 1.6* 1.6*   No results for input(s): LIPASE, AMYLASE in the last 168 hours. No results for input(s): AMMONIA in the last 168 hours. CBC: Recent Labs  Lab 06/09/20 0355 06/09/20 0355 06/10/20 0352 06/11/20 0500 06/11/20 1219 06/12/20 0345 06/13/20 0626  WBC 11.3*  --  10.6* 10.3  --  12.0* 8.9  NEUTROABS 9.2*  --  8.0* 7.7  --  9.6* 6.7  HGB 8.4*   < > 8.0* 7.6* 8.5* 7.6* 7.5*  HCT 26.2*   < > 24.9* 23.4* 26.1* 23.7* 23.2*  MCV 94.2  --  92.6 92.5  --  93.7 94.3  PLT 351  --  345 362  --  337 327   < > = values in this interval not displayed.   Cardiac Enzymes: No results for input(s): CKTOTAL, CKMB, CKMBINDEX, TROPONINI in the last 168 hours. BNP: BNP (last 3 results) Recent Labs    02/14/20 1746 06/12/20 0345  BNP 985.5* 202.7*    ProBNP (last 3 results) No results for input(s): PROBNP in the last 8760 hours.  CBG: No results for input(s): GLUCAP in the last 168 hours.     Signed:  Fayrene Helper MD.  [facility] 06/13/2020, 4:11 PM

## 2020-06-13 NOTE — Progress Notes (Signed)
Chemo med set with pt. Placed in pt belonging bag, SRP, RN

## 2020-06-13 NOTE — Care Management Important Message (Signed)
Important Message  Patient Details IM Letter given to the Patient Name: Valerie [name] MRN: [id number] Date of Birth: 1946/07/23   Medicare Important Message Given:  Yes     Kerin Salen 06/13/2020, 12:19 PM

## 2020-06-13 NOTE — Progress Notes (Signed)
Report called to Scientist, clinical (histocompatibility and immunogenetics) at [employer].  PTAR dispatched. SRP, RN

## 2020-06-13 NOTE — TOC Progression Note (Signed)
Transition of Care [facility]) - Progression Note    Patient Details  Name: Valerie [name] MRN: [id number] Date of Birth: 01-01-1946  Transition of Care [facility]) CM/SW Contact  Purcell Mouton, RN Phone Number: 06/13/2020, 4:29 PM  Clinical Narrative:    [name] was called. PTAR 6-7PM.     Expected Discharge Plan:  (SNF v Home) Barriers to Discharge: SNF Pending bed offer  Expected Discharge Plan and Services Expected Discharge Plan:  (SNF v Home)   Discharge Planning Services: CM Consult Post Acute Care Choice: [place] Living arrangements for the past 2 months: Single Family Home Expected Discharge Date: 06/13/20                                     Social Determinants of Health (SDOH) Interventions    Readmission Risk Interventions Readmission Risk Prevention Plan 02/18/2020  Transportation Screening Complete  PCP or Specialist Appt within 5-7 Days Complete  Home Care Screening Patient refused  Medication Review (RN CM) Complete  Some recent data might be hidden

## 2020-06-13 NOTE — Progress Notes (Signed)
Physical Therapy Treatment Patient Details Name: Valerie [name] MRN: [id number] DOB: 1946-07-11 Today's Date: 06/13/2020    History of Present Illness Pt is 74 yo female with hx including metastatic lung CA, adenocarcinoma with brain and neck mets, hx of craniotomy for tumor resection, whole brain radiation, R thyroidectomy with radical neck dissection, CHF, MRSA, and UTIs.  Pt presented to hospital with L sided flank/back pain as well as FTT and progressive weakness. Pt admitted with sepsis due to back cellulitis and possible UTI.    PT Comments    Pt with good participation with encouragement but remains profoundly weak, fatigues easily, and required assist of 2.  Session focused on EOB balance with facilitation using reaching across body, LE exercises with cues to limit compensation, and sit to stands with maxA of 2 and bed pad to facilitate.  Continue plan of care.     Follow Up Recommendations  SNF;Supervision/Assistance - 24 hour     Equipment Recommendations  None recommended by PT    Recommendations for Other Services       Precautions / Restrictions Precautions Precautions: Fall Precaution Comments: wounds - entire back excoriated/superficial wounds; non ambulatory for 1 year per pt but going to outpt PT for strengthening Restrictions Weight Bearing Restrictions: No    Mobility  Bed Mobility Overal bed mobility: Needs Assistance Bed Mobility: Sit to Supine;Supine to Sit   Sidelying to sit: Max assist;+2 for physical assistance Supine to sit: Mod assist;+2 for physical assistance Sit to supine: +2 for physical assistance;Mod assist   General bed mobility comments: Pt was able to move LEs to EOB and then reach for therapist hand.  Then required mod A for trunk and to scoot forward.  Transfers Overall transfer level: Needs assistance   Transfers: Sit to/from Stand Sit to Stand: Max assist;+2 physical assistance         General transfer comment: Performed  partial sit to standx x 3 with pt leaning forward with arms on armrest of recliner facing pt.  Therapist and tech used bed pad to facilitate stand  Ambulation/Gait             General Gait Details: non ambulatory   Stairs             Wheelchair Mobility    Modified Rankin (Stroke Patients Only)       Balance Overall balance assessment: Needs assistance Sitting-balance support: Bilateral upper extremity supported Sitting balance-Leahy Scale: Poor Sitting balance - Comments: Pt very kyphotic and leans R.  Initially requiring mod A but able to progress to min guard for 3-4 minutes.  As she fatigued required min A.  Sat EOB total about 10 mins.  Frequent cues for posture , use of UE, and leaning forward.  Did perform reaching outside BOS to the left to correct posture x 5. Postural control: Right lateral lean;Posterior lean Standing balance support: Bilateral upper extremity supported Standing balance-Leahy Scale: Zero Standing balance comment: max x 2 for partial stands <10 sec each                            Cognition Arousal/Alertness: Awake/alert Behavior During Therapy: Anxious (fear of falling) Overall Cognitive Status: No family/caregiver present to determine baseline cognitive functioning                                 General Comments: Followed simple commands  with increased time.  Fatigues quickly. Needs encouragement to participate.      Exercises General Exercises - Lower Extremity Ankle Circles/Pumps: AROM;Both;Supine;10 reps Quad Sets: AROM;Both;Supine;10 reps Gluteal Sets: AROM;Supine;Both;10 reps Long Arc Quad: AROM;Seated;Both;5 reps Heel Slides: AAROM;Both;10 reps;Supine (cues to limit hip ER) Hip ABduction/ADduction: AAROM;Both;10 reps;Supine Other Exercises Other Exercises: Reinforced head retraction exercises: Pt instructed to push head back into pillow for 5 second count x5 reps. Pt able to demo back correctly with  increased time and multimodal cues. Pt was encouraged to continue these exercises on her own ad lib.    General Comments General comments (skin integrity, edema, etc.): VSS; spouse present      Pertinent Vitals/Pain Pain Assessment: Faces Faces Pain Scale: Hurts little more Pain Location: back Pain Descriptors / Indicators: Grimacing;Moaning Pain Intervention(s): Limited activity within patient's tolerance;Monitored during session;Repositioned;Relaxation    Home Living                      Prior Function            PT Goals (current goals can now be found in the care plan section) Acute Rehab PT Goals Patient Stated Goal: decrease pain; get more therapy at rehab PT Goal Formulation: With patient Time For Goal Achievement: 06/18/20 Potential to Achieve Goals: Poor Progress towards PT goals: Progressing toward goals    Frequency    Min 2X/week      PT Plan Current plan remains appropriate    Co-evaluation              AM-PAC PT "6 Clicks" Mobility   Outcome Measure  Help needed turning from your back to your side while in a flat bed without using bedrails?: A Lot Help needed moving from lying on your back to sitting on the side of a flat bed without using bedrails?: A Lot Help needed moving to and from a bed to a chair (including a wheelchair)?: Total Help needed standing up from a chair using your arms (e.g., wheelchair or bedside chair)?: Total Help needed to walk in hospital room?: Total Help needed climbing 3-5 steps with a railing? : Total 6 Click Score: 8    End of Session Equipment Utilized During Treatment: Gait belt Activity Tolerance: Patient limited by fatigue Patient left: in bed;with call bell/phone within reach;with bed alarm set;with family/visitor present Nurse Communication: Mobility status;Need for lift equipment PT Visit Diagnosis: Muscle weakness (generalized) (M62.81);Unsteadiness on feet (R26.81)     Time: 6734-1937 PT  Time Calculation (min) (ACUTE ONLY): 29 min  Charges:  $Therapeutic Exercise: 8-22 mins $Therapeutic Activity: 8-22 mins                     Abran Richard, PT Acute Rehab Services Pager [phone] [facility] [phone]     Karlton Lemon 06/13/2020, 2:26 PM

## 2020-06-14 ENCOUNTER — Ambulatory Visit: Payer: Medicare PPO | Admitting: Physical Therapy

## 2020-06-15 ENCOUNTER — Telehealth: Payer: Self-pay

## 2020-06-15 NOTE — Telephone Encounter (Signed)
Attempted to call patient to do tcm call

## 2020-06-16 ENCOUNTER — Ambulatory Visit: Payer: Medicare PPO | Admitting: Physical Therapy

## 2020-06-16 DIAGNOSIS — Z7901 Long term (current) use of anticoagulants: Secondary | ICD-10-CM | POA: Diagnosis not present

## 2020-06-16 DIAGNOSIS — C3490 Malignant neoplasm of unspecified part of unspecified bronchus or lung: Secondary | ICD-10-CM | POA: Diagnosis not present

## 2020-06-16 DIAGNOSIS — L270 Generalized skin eruption due to drugs and medicaments taken internally: Secondary | ICD-10-CM | POA: Diagnosis not present

## 2020-06-16 DIAGNOSIS — R627 Adult failure to thrive: Secondary | ICD-10-CM | POA: Diagnosis not present

## 2020-06-17 ENCOUNTER — Other Ambulatory Visit: Payer: Self-pay

## 2020-06-17 ENCOUNTER — Non-Acute Institutional Stay: Payer: Self-pay | Admitting: Hospice

## 2020-06-17 ENCOUNTER — Telehealth: Payer: Self-pay

## 2020-06-17 DIAGNOSIS — R21 Rash and other nonspecific skin eruption: Secondary | ICD-10-CM

## 2020-06-17 DIAGNOSIS — Z515 Encounter for palliative care: Secondary | ICD-10-CM

## 2020-06-17 NOTE — Telephone Encounter (Signed)
Called to do tcm call. Patient is in rehab at [facility] and rehab on [address]

## 2020-06-17 NOTE — Progress Notes (Signed)
[place] Consult Note Telephone: [phone]  Fax: [phone]  PATIENT NAME: Valerie [name] [address] [place] [address] [phone] (home)  DOB: Jul 13, 1946 MRN: [id number]  PRIMARY CARE PROVIDER:    [name], MD,  [address] [place] [address] [place] [address] [phone]  REFERRING PROVIDER:   Martinique Blattenberger NP RESPONSIBLE PARTY: Self   Extended Emergency Contact Information Primary Emergency Contact: Ascher,Maurice Address: [address]          [place], [place] [address] [place] Phone: [phone] Mobile Phone: [phone] Relation: Spouse Secondary Emergency Contact: Sharman Crate,  [place] of [place] Phone: [phone] Mobile Phone: [phone] Relation: Daughter  I met face to face with patient and family in home/facility.  RECOMMENDATIONS/PLAN:    Visit at the request of Martinique Blattenberger, NP for palliative consult. Visit consisted of building trust and discussions on Palliative Medicine as specialized medical care for people living with serious illness, aimed at facilitating better quality of life through symptoms relief, assisting with advance care plan and establishing goals of care.  Patient endorsed palliative service.  NP also called and spoke with spouse-Maurice with update on visit with patient.  He shared patient's long battle cancer.  Therapeutic listening and ample emotional support.  Palliative care will continue to provide support patient, family and the medical team  Discussion on the difference between Palliative and Hospice care. Palliative care and hospice have similar goals of managing symptoms, promoting comfort, improving quality of life, and maintaining a person's dignity. However, palliative care may be offered during any phase of a serious illness, while hospice care is usually  offered when a person is expected to live for 6 months or less.  Advance Care Planning: Our advance care planning conversation included a discussion about:    The value and importance of advance care planning  Experiences with loved ones who have been seriously ill or have died  Exploration of personal, cultural or spiritual beliefs that might influence medical decisions  Exploration of goals of care in the event of a sudden injury or illness  Identification and preparation of a healthcare agent  Review and updating or creation of an  advance directive document  CODE STATUS: Ramifications and implications of CODE STATUS discussed.  Patient affirmed she is is a DO NOT RESUSCITATE.  There is a signed DNR form in facility records; same document uploaded to epic today.  Patient has a MOST form in epic with contradictory information.  This has been removed.  Plan is to fill out a new MOST form with patient next visit.  GOALS OF CARE: Goals of care include to maximize quality of life and symptom management.  Follow up Palliative Care Visit: Palliative care will continue to follow for goals of care clarification and symptom management.  Follow-up in a month/as needed  Symptom Management:  Nausea/vomiting: Patient denies nausea/vomiting; she is no longer on oral chemo at this time.  Patient denies pain/discomfort, no itching, no difficulty breathing.   Patient has zinc oxide cream and barrier cream as needed for skin lesion.  Skin rash has improved since Tarceva was stopped.  Continue weekly skin assessment.  Fatigue: PT OT is ongoing. Palliative will continue to monitor for symptom management/decline and make recommendations as needed. Family /Caregiver/Community Supports: Patient is in SNF for rehab.  Patient is a retired Patent examiner and her spouse is a  retired Sport and exercise psychologist.  Spouse is involved in her care.  Strong family support system identified.  I spent 1 hour and 25 minutes  providing this initial consultation; time includes time spent with patient/family, chart review, provider coordination,  and documentation. More than 50% of the time in this consultation was spent on coordinating communication  CHIEF COMPLAIN/HISTORY OF PRESENT ILLNESS:  Valerie [name] is a 74 y.o. 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 Palliative Care was asked to follow this patient by consultation request of Martinique Blattenberger, NP to help address advance care planning and goals of care.  CODE STATUS: DO NOT RESUSCITATE   PPS: 40%  HOSPICE ELIGIBILITY/DIAGNOSIS: TBD  PAST MEDICAL HISTORY:  Past Medical History:  Diagnosis Date  . Abnormal Pap smear 2006  . Anal fistula   . Ankle fracture   . Anxiety   . Arthritis   . Atrophic vaginitis 2008  . Cataract   . Dementia ([place]) 2009  . Dyspareunia 2008  . H/O osteoporosis   . H/O varicella   . H/O vitamin D deficiency   . Headache 07/24/2016  . Heart murmur   . History of measles, mumps, or rubella   . History of radiation therapy 07/28/13- 08/10/13   right lung metastasis 5000 cGy 10 sessions  . Hypertension   . Lung cancer ([place]) dx'd 2002  . Lung cancer ([place])   . Lung cancer ([place])   . Lung metastasis ([place])    PET scan 05/05/13, RUL lung nodule  . Metastasis to brain [facility]) dx'd 2008  . 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 Nodule of right lung CT- 06/03/12   RIGHT UPPER LOBE  . Nodule of right lung 06/03/12   Upper Lobe  . On antineoplastic chemotherapy    TARCEVA  . Osteoporosis 2010  . Primary cancer of right upper lobe of lung ([place]) 04/22/2009   Qualifier: Diagnosis of  By: Nils Pyle CMA ([place]), Mearl Latin    . Shortness of breath    hx lung ca   . Status post chemotherapy 2003   CARBOPLATIN/PACLITAXEL /STATUS POST CLINICAL TRAIL OF CELEBREX AND IRESSA AT [facility] FOR 1 YEAR  . Status post radiation  therapy 2003   LEFT LUNG  . Status post radiation therapy 11/07/2005   WHOLE BRAIN: DR Larkin [facility]  . Status post radiation therapy 06/02/2008   GAMMA KNIFE OF RESECTED CAVITAY  . Thyroid adenoma    ?  [name] Thyroid cancer ([place]) 10/18/11 bx   adenoid nodules   . Yeast infection     SOCIAL HX:  Social History   Tobacco Use  . Smoking status: Never Smoker  . Smokeless tobacco: Never Used  . Tobacco comment: smoked few years in college  Substance Use Topics  . Alcohol use: No   FAMILY HX:  Family History  Problem Relation Age of Onset  . Gait disorder Mother   . Cancer Mother        Meningioma  . Cancer Father        Pancreatic  . Heart failure Sister   . Cancer Maternal Aunt        menigeoma    ALLERGIES: No Active Allergies   PERTINENT MEDICATIONS:  Outpatient Encounter Medications as of 06/17/2020  Medication Sig  . Ascorbic Acid (VITAMIN C) 100 MG tablet Take 100 mg by mouth every evening. Gummie  . Cholecalciferol (D3  PO) Take 1 capsule by mouth daily.  . clopidogrel (PLAVIX) 75 MG tablet Take 1 tablet (75 mg total) by mouth daily with breakfast.  . diphenhydrAMINE (BENADRYL) 25 MG tablet Take 25 mg by mouth every 6 (six) hours as needed for allergies.  [name] donepezil (ARICEPT) 10 MG tablet Take 10 mg by mouth at bedtime.  . hydrocerin (EUCERIN) CREA Apply 1 application topically 2 (two) times daily. To back and buttocks.  . loperamide (IMODIUM A-D) 2 MG tablet Take 4 mg by mouth in the morning and at bedtime.  [name] MAGNESIUM PO Take 1 tablet by mouth daily.  . metoprolol succinate (TOPROL-XL) 25 MG 24 hr tablet Take 1 tablet (25 mg total) by mouth daily.  . Multiple Vitamin (MULTI-VITAMINS) TABS Take 1 tablet by mouth every evening.   [name] Nystatin (GERHARDT'S BUTT CREAM) CREA Apply 1 application topically 2 (two) times daily.  [name] nystatin (MYCOSTATIN/NYSTOP) powder Apply topically 2 (two) times daily. To breast folds and groin  . potassium chloride SA (KLOR-CON) 20 MEQ tablet  Take 20 mEq by mouth daily.  . prochlorperazine (COMPAZINE) 10 MG tablet Take 1 tablet (10 mg total) by mouth every 6 (six) hours as needed for nausea or vomiting.  . saccharomyces boulardii (FLORASTOR) 250 MG capsule Take 1 capsule (250 mg total) by mouth 2 (two) times daily. (Patient not taking: Reported on 06/03/2020)  . vitamin B-12 (CYANOCOBALAMIN) 1000 MCG tablet Take 1,000 mcg by mouth every evening.  [name] 20 MG TABS tablet Take 1 tablet (20 mg total) by mouth every evening.  . zinc oxide (BALMEX) 11.3 % CREA cream Apply 1 application topically as needed (lesions/buttocks).   No facility-administered encounter medications on file as of 06/17/2020.    PHYSICAL EXAM / ROS: Height: 69 inches     weight: 156 pounds General: In no acute distress, operative Cardiovascular: regular rate and rhythm, no chest pain reported Pulmonary: no cough, no shortness of breath, clear ant/post fields, normal respiratory effort Abdomen: soft, non tender, positive bowel sounds in all quadrants GU: denies dysuria, no suprapubic tenderness Extremities: no edema Skin: no rashes to exposed skin Neurological: Weakness but otherwise non focal  Teodoro Spray, NP

## 2020-06-20 DIAGNOSIS — R350 Frequency of micturition: Secondary | ICD-10-CM | POA: Diagnosis not present

## 2020-06-20 DIAGNOSIS — R110 Nausea: Secondary | ICD-10-CM | POA: Diagnosis not present

## 2020-06-20 DIAGNOSIS — R52 Pain, unspecified: Secondary | ICD-10-CM | POA: Diagnosis not present

## 2020-06-20 DIAGNOSIS — R300 Dysuria: Secondary | ICD-10-CM | POA: Diagnosis not present

## 2020-06-21 ENCOUNTER — Ambulatory Visit: Payer: Medicare PPO | Admitting: Physical Therapy

## 2020-06-21 DIAGNOSIS — R1084 Generalized abdominal pain: Secondary | ICD-10-CM | POA: Diagnosis not present

## 2020-06-21 DIAGNOSIS — Z87440 Personal history of urinary (tract) infections: Secondary | ICD-10-CM | POA: Diagnosis not present

## 2020-06-21 DIAGNOSIS — R350 Frequency of micturition: Secondary | ICD-10-CM | POA: Diagnosis not present

## 2020-06-21 DIAGNOSIS — N390 Urinary tract infection, site not specified: Secondary | ICD-10-CM | POA: Diagnosis not present

## 2020-06-21 DIAGNOSIS — R110 Nausea: Secondary | ICD-10-CM | POA: Diagnosis not present

## 2020-06-21 DIAGNOSIS — R300 Dysuria: Secondary | ICD-10-CM | POA: Diagnosis not present

## 2020-06-24 ENCOUNTER — Encounter: Payer: Self-pay | Admitting: *Deleted

## 2020-06-24 ENCOUNTER — Other Ambulatory Visit: Payer: Self-pay

## 2020-06-24 ENCOUNTER — Non-Acute Institutional Stay: Payer: Self-pay | Admitting: Hospice

## 2020-06-24 DIAGNOSIS — M25572 Pain in left ankle and joints of left foot: Secondary | ICD-10-CM | POA: Diagnosis not present

## 2020-06-24 DIAGNOSIS — R300 Dysuria: Secondary | ICD-10-CM | POA: Diagnosis not present

## 2020-06-24 DIAGNOSIS — Z515 Encounter for palliative care: Secondary | ICD-10-CM

## 2020-06-24 DIAGNOSIS — M6281 Muscle weakness (generalized): Secondary | ICD-10-CM | POA: Diagnosis not present

## 2020-06-24 DIAGNOSIS — R5381 Other malaise: Secondary | ICD-10-CM | POA: Diagnosis not present

## 2020-06-24 DIAGNOSIS — N390 Urinary tract infection, site not specified: Secondary | ICD-10-CM | POA: Diagnosis not present

## 2020-06-24 DIAGNOSIS — K5909 Other constipation: Secondary | ICD-10-CM | POA: Diagnosis not present

## 2020-06-24 DIAGNOSIS — Z87440 Personal history of urinary (tract) infections: Secondary | ICD-10-CM | POA: Diagnosis not present

## 2020-06-24 DIAGNOSIS — R262 Difficulty in walking, not elsewhere classified: Secondary | ICD-10-CM | POA: Diagnosis not present

## 2020-06-24 DIAGNOSIS — C799 Secondary malignant neoplasm of unspecified site: Secondary | ICD-10-CM

## 2020-06-24 NOTE — Progress Notes (Signed)
[facility] Consult Note Telephone: [phone]  Fax: [phone]  PATIENT NAME: Valerie [name] DOB: [birth year] MRN: [id number]  PRIMARY CARE PROVIDER:   [name], MD [name], [address] [place],  [place] [address]  REFERRING PROVIDER:   Martinique Blattenberger NP RESPONSIBLE PARTY: Self   Extended Emergency Contact Information Primary Emergency Contact: Laton,Maurice Address: [address]          [place], [place] [address] Johnnette Litter of [place] Phone: [phone] Mobile Phone: [phone] Relation: Spouse Secondary Emergency Contact: Sharman Crate, [place] of [place] Phone: [phone] Mobile Phone: [phone] Relation: Daughter  I met face to face with patient and family in home   RECOMMENDATIONS/PLAN:  Follow-up visit to build trust; extensive discussion today on goals of care and medical orders for scope of treatment.  NP called spouse and updated him on visit.  He expressed gratitude for the call/update.  Advance Care Planning/Code Status: CODE STATUS reviewed today.  Patient affirmed she is a DO NOT RESUSCITATE.  She said in as much as she wants to live and be there for her family, she does not want to be resuscitated if she should pass on.  Therapeutic listening and validation provided.  Goals of Care: Goals of care include to maximize quality of life and symptom management.  After discussions on ramifications and implications of medical orders for scope of treatment, her MOST selections today include DO NOT RESUSCITATE, limited additional intervention, IV fluids as indicated, antibiotics as indicated, no feeding tube.  NP signed MOST form for facility records; same document uploaded to epic today. Patient said she is willing to try chemotherapy in the future if it is recommended by her doctor.  She wishes to return to a level of  independence in her activities of daily living.   Follow up: Palliative care will continue to follow patient for goals of care clarification and symptom management.  Follow-up in 1 month/as needed  Functional status/Symptom management:  Urinary symptoms: Patient denied urinary symptoms today.  Continue Cipro as ordered for UTI.   Fatigue: PT OT is ongoing.  No reported nausea or vomiting.  Patient in no acute distress, she is compliant with her medications.  Nursing with no concerns at this time.  Palliative will continue to monitor for symptom management/decline and make recommendations as needed.  Family /Caregiver/Community Supports: Patient is in SNF for rehab.  Patient is a retired Patent examiner and her spouse is a retired Sport and exercise psychologist.  Spouse is involved in her care. He said their grown up children are accepting of patient's condition and all factor into health care decisions made.  Strong family support system identified.  I spent one hour and 16 minutes providing this consultation; time includes time spent with patient/family, chart review, provider coordination,  and documentation. More than 50% of the time in this consultation was spent on coordinating communication  CHIEF COMPLAIN/HISTORY OF PRESENT ILLNESS:  Valerie [name] is a 74 y.o. 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 Palliative Care was asked to follow this patient by consultation request of Martinique Blattenberger, NP to help address advance care planning and goals of care.  CODE STATUS: DO NOT RESUSCITATE  PPS: 30%  HOSPICE ELIGIBILITY/DIAGNOSIS: TBD  PAST MEDICAL HISTORY:  Past Medical History:  Diagnosis Date  . Abnormal Pap smear 2006  . Anal fistula   . Ankle fracture   . Anxiety   . Arthritis   . Atrophic vaginitis 2008  . Cataract   . Dementia ([place]) 2009  . Dyspareunia 2008  . H/O  osteoporosis   . H/O varicella   . H/O vitamin D deficiency   . Headache 07/24/2016  . Heart murmur   . History of measles, mumps, or rubella   . History of radiation therapy 07/28/13- 08/10/13   right lung metastasis 5000 cGy 10 sessions  . Hypertension   . Lung cancer ([place]) dx'd 2002  . Lung cancer ([place])   . Lung cancer ([place])   . Lung metastasis ([place])    PET scan 05/05/13, RUL lung nodule  . 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 Nodule of right lung CT- 06/03/12   RIGHT UPPER LOBE  . Nodule of right lung 06/03/12   Upper Lobe  . On antineoplastic chemotherapy    TARCEVA  . Osteoporosis 2010  . Primary cancer of right upper lobe of lung ([place]) 04/22/2009   Qualifier: Diagnosis of  By: Nils Pyle CMA ([place]), Mearl Latin    . Shortness of breath    hx lung ca   . Status post chemotherapy 2003   CARBOPLATIN/PACLITAXEL /STATUS POST CLINICAL TRAIL OF CELEBREX AND IRESSA AT [facility] FOR 1 YEAR  . Status post radiation therapy 2003   LEFT LUNG  . Status post radiation therapy 11/07/2005   WHOLE BRAIN: DR Larkin [facility]  . Status post radiation therapy 06/02/2008   GAMMA KNIFE OF RESECTED CAVITAY  . Thyroid adenoma    ?  [name] Thyroid cancer ([place]) 10/18/11 bx   adenoid nodules   . Yeast infection     SOCIAL HX:  Social History   Tobacco Use  . Smoking status: Never Smoker  . Smokeless tobacco: Never Used  . Tobacco comment: smoked few years in college  Substance Use Topics  . Alcohol use: No    ALLERGIES: No Active Allergies   PERTINENT MEDICATIONS:  Outpatient Encounter Medications as of 06/24/2020  Medication Sig  . Ascorbic Acid (VITAMIN C) 100 MG tablet Take 100 mg by mouth every evening. Gummie  . Cholecalciferol (D3 PO) Take 1 capsule by mouth daily.  . clopidogrel (PLAVIX) 75 MG tablet Take 1 tablet (75 mg total) by mouth daily with breakfast.  . diphenhydrAMINE (BENADRYL) 25 MG tablet Take 25 mg by mouth every 6 (six) hours as needed for  allergies.  [name] donepezil (ARICEPT) 10 MG tablet Take 10 mg by mouth at bedtime.  . hydrocerin (EUCERIN) CREA Apply 1 application topically 2 (two) times daily. To back and buttocks.  . loperamide (IMODIUM A-D) 2 MG tablet Take 4 mg by mouth in the morning and at bedtime.  [name] MAGNESIUM PO Take 1 tablet by mouth daily.  . metoprolol succinate (TOPROL-XL) 25 MG 24 hr tablet Take 1 tablet (25 mg total) by mouth daily.  . Multiple Vitamin (MULTI-VITAMINS) TABS Take 1 tablet by mouth every evening.   [name] Nystatin (GERHARDT'S BUTT CREAM) CREA Apply 1 application topically 2 (two) times daily.  [name] nystatin (MYCOSTATIN/NYSTOP) powder Apply topically 2 (two) times daily. To breast folds and groin  . potassium chloride SA (KLOR-CON) 20 MEQ tablet Take 20 mEq by mouth daily.  . prochlorperazine (COMPAZINE) 10 MG tablet Take 1 tablet (10 mg total) by mouth every 6 (six) hours as  needed for nausea or vomiting.  . saccharomyces boulardii (FLORASTOR) 250 MG capsule Take 1 capsule (250 mg total) by mouth 2 (two) times daily. (Patient not taking: Reported on 06/03/2020)  . vitamin B-12 (CYANOCOBALAMIN) 1000 MCG tablet Take 1,000 mcg by mouth every evening.  [name] 20 MG TABS tablet Take 1 tablet (20 mg total) by mouth every evening.  . zinc oxide (BALMEX) 11.3 % CREA cream Apply 1 application topically as needed (lesions/buttocks).   No facility-administered encounter medications on file as of 06/24/2020.    PHYSICAL EXAM/ROS:  Height: 69 inches     weight: 156 pounds General: In no acute distress, operative Cardiovascular: regular rate and rhythm, no chest pain reported Pulmonary: no cough, no shortness of breath, clear ant/post fields, normal respiratory effort Abdomen: soft, non tender, positive bowel sounds in all quadrants GU: denies dysuria, burning, no suprapubic tenderness Extremities: no edema; pending results of x-ray of left ankle related to previous complaint of pain  Skin: no rashes to exposed  skin Neurological: Weakness but otherwise non focal  Teodoro Spray, NP

## 2020-06-25 ENCOUNTER — Other Ambulatory Visit: Payer: Self-pay | Admitting: Internal Medicine

## 2020-06-25 DIAGNOSIS — Z86718 Personal history of other venous thrombosis and embolism: Secondary | ICD-10-CM

## 2020-06-27 DIAGNOSIS — R600 Localized edema: Secondary | ICD-10-CM | POA: Diagnosis not present

## 2020-06-27 DIAGNOSIS — S93402A Sprain of unspecified ligament of left ankle, initial encounter: Secondary | ICD-10-CM | POA: Diagnosis not present

## 2020-06-27 DIAGNOSIS — M25572 Pain in left ankle and joints of left foot: Secondary | ICD-10-CM | POA: Diagnosis not present

## 2020-06-28 ENCOUNTER — Encounter: Payer: Self-pay | Admitting: Internal Medicine

## 2020-06-28 ENCOUNTER — Other Ambulatory Visit: Payer: Self-pay | Admitting: Internal Medicine

## 2020-06-28 ENCOUNTER — Other Ambulatory Visit: Payer: Self-pay

## 2020-06-28 ENCOUNTER — Inpatient Hospital Stay: Payer: Medicare PPO

## 2020-06-28 ENCOUNTER — Encounter: Payer: Medicare PPO | Admitting: Physical Therapy

## 2020-06-28 ENCOUNTER — Telehealth: Payer: Self-pay | Admitting: Internal Medicine

## 2020-06-28 ENCOUNTER — Inpatient Hospital Stay: Payer: Medicare PPO | Attending: Internal Medicine | Admitting: Internal Medicine

## 2020-06-28 VITALS — BP 119/79 | HR 81 | Temp 97.9°F | Resp 17 | Ht 67.0 in

## 2020-06-28 DIAGNOSIS — J90 Pleural effusion, not elsewhere classified: Secondary | ICD-10-CM | POA: Insufficient documentation

## 2020-06-28 DIAGNOSIS — R634 Abnormal weight loss: Secondary | ICD-10-CM | POA: Diagnosis not present

## 2020-06-28 DIAGNOSIS — C7931 Secondary malignant neoplasm of brain: Secondary | ICD-10-CM | POA: Diagnosis not present

## 2020-06-28 DIAGNOSIS — C3411 Malignant neoplasm of upper lobe, right bronchus or lung: Secondary | ICD-10-CM | POA: Diagnosis not present

## 2020-06-28 DIAGNOSIS — Z5111 Encounter for antineoplastic chemotherapy: Secondary | ICD-10-CM | POA: Diagnosis not present

## 2020-06-28 DIAGNOSIS — R627 Adult failure to thrive: Secondary | ICD-10-CM | POA: Diagnosis not present

## 2020-06-28 DIAGNOSIS — D638 Anemia in other chronic diseases classified elsewhere: Secondary | ICD-10-CM | POA: Insufficient documentation

## 2020-06-28 DIAGNOSIS — M1990 Unspecified osteoarthritis, unspecified site: Secondary | ICD-10-CM | POA: Insufficient documentation

## 2020-06-28 DIAGNOSIS — Z923 Personal history of irradiation: Secondary | ICD-10-CM | POA: Insufficient documentation

## 2020-06-28 DIAGNOSIS — E440 Moderate protein-calorie malnutrition: Secondary | ICD-10-CM | POA: Diagnosis not present

## 2020-06-28 DIAGNOSIS — N12 Tubulo-interstitial nephritis, not specified as acute or chronic: Secondary | ICD-10-CM | POA: Diagnosis not present

## 2020-06-28 DIAGNOSIS — M898X9 Other specified disorders of bone, unspecified site: Secondary | ICD-10-CM | POA: Diagnosis not present

## 2020-06-28 DIAGNOSIS — M6281 Muscle weakness (generalized): Secondary | ICD-10-CM | POA: Insufficient documentation

## 2020-06-28 DIAGNOSIS — R339 Retention of urine, unspecified: Secondary | ICD-10-CM | POA: Diagnosis not present

## 2020-06-28 DIAGNOSIS — I110 Hypertensive heart disease with heart failure: Secondary | ICD-10-CM | POA: Diagnosis not present

## 2020-06-28 DIAGNOSIS — I700 Atherosclerosis of aorta: Secondary | ICD-10-CM | POA: Diagnosis not present

## 2020-06-28 DIAGNOSIS — F0390 Unspecified dementia without behavioral disturbance: Secondary | ICD-10-CM | POA: Insufficient documentation

## 2020-06-28 DIAGNOSIS — R109 Unspecified abdominal pain: Secondary | ICD-10-CM | POA: Insufficient documentation

## 2020-06-28 DIAGNOSIS — R8271 Bacteriuria: Secondary | ICD-10-CM | POA: Insufficient documentation

## 2020-06-28 DIAGNOSIS — Z85850 Personal history of malignant neoplasm of thyroid: Secondary | ICD-10-CM | POA: Diagnosis not present

## 2020-06-28 DIAGNOSIS — R5381 Other malaise: Secondary | ICD-10-CM | POA: Diagnosis not present

## 2020-06-28 DIAGNOSIS — Z7901 Long term (current) use of anticoagulants: Secondary | ICD-10-CM | POA: Diagnosis not present

## 2020-06-28 DIAGNOSIS — C3432 Malignant neoplasm of lower lobe, left bronchus or lung: Secondary | ICD-10-CM | POA: Diagnosis not present

## 2020-06-28 DIAGNOSIS — R262 Difficulty in walking, not elsewhere classified: Secondary | ICD-10-CM | POA: Diagnosis not present

## 2020-06-28 DIAGNOSIS — Z79899 Other long term (current) drug therapy: Secondary | ICD-10-CM | POA: Diagnosis not present

## 2020-06-28 DIAGNOSIS — D259 Leiomyoma of uterus, unspecified: Secondary | ICD-10-CM | POA: Diagnosis not present

## 2020-06-28 DIAGNOSIS — I509 Heart failure, unspecified: Secondary | ICD-10-CM | POA: Insufficient documentation

## 2020-06-28 DIAGNOSIS — R5383 Other fatigue: Secondary | ICD-10-CM | POA: Diagnosis not present

## 2020-06-28 DIAGNOSIS — M25572 Pain in left ankle and joints of left foot: Secondary | ICD-10-CM | POA: Diagnosis not present

## 2020-06-28 DIAGNOSIS — Z902 Acquired absence of lung [part of]: Secondary | ICD-10-CM | POA: Diagnosis not present

## 2020-06-28 LAB — CBC WITH DIFFERENTIAL (CANCER CENTER ONLY)
Abs Immature Granulocytes: 0.16 10*3/uL — ABNORMAL HIGH (ref 0.00–0.07)
Basophils Absolute: 0 10*3/uL (ref 0.0–0.1)
Basophils Relative: 1 %
Eosinophils Absolute: 0.2 10*3/uL (ref 0.0–0.5)
Eosinophils Relative: 2 %
HCT: 31 % — ABNORMAL LOW (ref 36.0–46.0)
Hemoglobin: 9.5 g/dL — ABNORMAL LOW (ref 12.0–15.0)
Immature Granulocytes: 2 %
Lymphocytes Relative: 15 %
Lymphs Abs: 1.3 10*3/uL (ref 0.7–4.0)
MCH: 29.9 pg (ref 26.0–34.0)
MCHC: 30.6 g/dL (ref 30.0–36.0)
MCV: 97.5 fL (ref 80.0–100.0)
Monocytes Absolute: 0.8 10*3/uL (ref 0.1–1.0)
Monocytes Relative: 9 %
Neutro Abs: 6.3 10*3/uL (ref 1.7–7.7)
Neutrophils Relative %: 71 %
Platelet Count: 451 10*3/uL — ABNORMAL HIGH (ref 150–400)
RBC: 3.18 MIL/uL — ABNORMAL LOW (ref 3.87–5.11)
RDW: 17.2 % — ABNORMAL HIGH (ref 11.5–15.5)
WBC Count: 8.7 10*3/uL (ref 4.0–10.5)
nRBC: 0 % (ref 0.0–0.2)

## 2020-06-28 LAB — CMP (CANCER CENTER ONLY)
ALT: 16 U/L (ref 0–44)
AST: 13 U/L — ABNORMAL LOW (ref 15–41)
Albumin: 2.8 g/dL — ABNORMAL LOW (ref 3.5–5.0)
Alkaline Phosphatase: 96 U/L (ref 38–126)
Anion gap: 6 (ref 5–15)
BUN: 14 mg/dL (ref 8–23)
CO2: 25 mmol/L (ref 22–32)
Calcium: 8.9 mg/dL (ref 8.9–10.3)
Chloride: 106 mmol/L (ref 98–111)
Creatinine: 0.74 mg/dL (ref 0.44–1.00)
GFR, Estimated: >60 mL/min (ref >60)
Glucose, Bld: 96 mg/dL (ref 70–99)
Potassium: 4.4 mmol/L (ref 3.5–5.1)
Sodium: 137 mmol/L (ref 135–145)
Total Bilirubin: 0.4 mg/dL (ref 0.3–1.2)
Total Protein: 6.3 g/dL — ABNORMAL LOW (ref 6.5–8.1)

## 2020-06-28 MED ORDER — ERLOTINIB HCL 100 MG PO TABS: 100.0000 mg | ORAL_TABLET | Freq: Every day | ORAL | 2 refills | Status: DC

## 2020-06-28 NOTE — Telephone Encounter (Signed)
Scheduled per 11/9 los. Printed avs and calendar for pt. Pt requested afternoons.

## 2020-06-28 NOTE — Progress Notes (Signed)
[place] Telephone:([phone]) [phone]   Fax:([phone]) [phone]  OFFICE PROGRESS NOTE  [name], [address] [place] [address]  PRINCIPAL DIAGNOSIS:  metastatic non-small cell lung cancer, adenocarcinoma with brain metastases initially diagnosed as a stage IIb in October 2002.   BIOMARKERS TESTING ([facility]): Positive for: EGFR [id number] del, T790M, CTNNB1 D32G, SMAD4 T140fs*10 Negative for: RET, ALK, BRAF, KRAS, ERBB2 and MET  PRIOR THERAPY:  1. Status post left lower lobectomy under the care of Dr Arlyce Dice in October 2002. 2. Status post course of concurrent chemoradiation with weekly carboplatin and paclitaxel under the care of Dr Benay Spice and Dr Elba Barman in early 2003. 3. Status post treatment with Celebrex and Iressa according to the clinical trial at [facility] for a total of 1 year. 4. Status post left occipital craniotomy for tumor resection on September 21, 2005, under the care of Dr Christella Noa. 5. Status post whole brain irradiation under the care of Dr Elba Barman, completed November 07, 2005. 6. Status post gamma knife treatment to the resected cavitary recurrence on June 02, 2008, at [facility]. 7. Status post right thyroidectomy with radical neck dissection under the care of Dr. Constance Holster on 10/18/2011. 8. Status post excisional biopsy of right cervical lymph node that was consistent with metastatic adenocarcinoma. 9. Tarceva 150 mg by mouth daily with therapy beginning 06/03/2007, discontinued in July 2015 secondary to disease progression. Status post 79 cycles. 10. palliative radiotherapy to the enlarging right upper lobe lung nodule under the care of Dr. Valere Dross. 11. Treatment at [facility] on STUDY [id number]  300 mg by mouth daily for 21 days discontinued secondary to study deviation and inability for the patient to keep her appointment at [facility] secondary to recent hip fractures. 12. Open reduction and  internal fixation of trimalleolar ankle fracture dislocation with fixation of the fibula as well as medial malleolus. 13. Tagrisso 80 mg by mouth daily started 11/25/2014, status post 62 months of treatment.  This was discontinued on 02/13/2020 secondary to congestive heart failure with ejection fraction of 20-25%.   CURRENT THERAPY: Tarceva 150 mg p.o. daily.  Started 02/26/2020.  This will be reduced to Tarceva 100 mg p.o. daily starting July 01, 2020.  INTERVAL HISTORY: Valerie [name] 74 y.o. female returns to the clinic today for follow-up visit accompanied by her husband.  The patient continues to have significant fatigue and weakness.  She was recently admitted to the hospital with cellulitis as well as pressure injury to the skin, rash and skin eruption.  She also has failure to thrive and is asymptomatic bacteriuria.  Her treatment with Tarceva was on hold during that time.  She lost a lot of weight recently.  She had a discussion with palliative care team about goals of care but the patient would like to continue with treatment.  She had CT scan of the chest during her hospitalization that showed no concerning findings for disease progression.  The patient denied having any current chest pain, shortness of breath, cough or hemoptysis.  She denied having any fever or chills.  She has no nausea, vomiting, diarrhea or constipation.  She cannot stand and she have braces on her left leg.  She is here today for evaluation and discussion of her treatment options.  MEDICAL HISTORY: Past Medical History:  Diagnosis Date  . Abnormal Pap smear 2006  . Anal fistula   . Ankle fracture   .  Anxiety   . Arthritis   . Atrophic vaginitis 2008  . Cataract   . Dementia ([place]) 2009  . Dyspareunia 2008  . H/O osteoporosis   . H/O varicella   . H/O vitamin D deficiency   . Headache 07/24/2016  . Heart murmur   . History of measles, mumps, or rubella   . History of radiation therapy 07/28/13-  08/10/13   right lung metastasis 5000 cGy 10 sessions  . Hypertension   . Lung cancer ([place]) dx'd 2002  . Lung cancer ([place])   . Lung cancer ([place])   . Lung metastasis ([place])    PET scan 05/05/13, RUL lung nodule  . Metastasis to brain [facility]) dx'd 2008  . 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 Nodule of right lung CT- 06/03/12   RIGHT UPPER LOBE  . Nodule of right lung 06/03/12   Upper Lobe  . On antineoplastic chemotherapy    TARCEVA  . Osteoporosis 2010  . Primary cancer of right upper lobe of lung ([place]) 04/22/2009   Qualifier: Diagnosis of  By: Nils Pyle CMA ([place]), Mearl Latin    . Shortness of breath    hx lung ca   . Status post chemotherapy 2003   CARBOPLATIN/PACLITAXEL /STATUS POST CLINICAL TRAIL OF CELEBREX AND IRESSA AT [facility] FOR 1 YEAR  . Status post radiation therapy 2003   LEFT LUNG  . Status post radiation therapy 11/07/2005   WHOLE BRAIN: DR Larkin [facility]  . Status post radiation therapy 06/02/2008   GAMMA KNIFE OF RESECTED CAVITAY  . Thyroid adenoma    ?  [name] Thyroid cancer ([place]) 10/18/11 bx   adenoid nodules   . Yeast infection     ALLERGIES:  has no active allergies.  MEDICATIONS:  Current Outpatient Medications  Medication Sig Dispense Refill  . Ascorbic Acid (VITAMIN C) 100 MG tablet Take 100 mg by mouth every evening. Gummie    . Cholecalciferol (D3 PO) Take 1 capsule by mouth daily.    . clopidogrel (PLAVIX) 75 MG tablet Take 1 tablet (75 mg total) by mouth daily with breakfast. 90 tablet 3  . diphenhydrAMINE (BENADRYL) 25 MG tablet Take 25 mg by mouth every 6 (six) hours as needed for allergies.    [name] donepezil (ARICEPT) 10 MG tablet Take 10 mg by mouth at bedtime.    . hydrocerin (EUCERIN) CREA Apply 1 application topically 2 (two) times daily. To back and buttocks. 113 g 0  . loperamide (IMODIUM A-D) 2 MG tablet Take 4 mg by mouth in the morning and at bedtime.    [name] MAGNESIUM PO Take 1 tablet by mouth daily.    . metoprolol succinate (TOPROL-XL) 25 MG 24 hr  tablet Take 1 tablet (25 mg total) by mouth daily. 90 tablet 3  . Multiple Vitamin (MULTI-VITAMINS) TABS Take 1 tablet by mouth every evening.     [name] Nystatin (GERHARDT'S BUTT CREAM) CREA Apply 1 application topically 2 (two) times daily. 1 each 1  . nystatin (MYCOSTATIN/NYSTOP) powder Apply topically 2 (two) times daily. To breast folds and groin 15 g 0  . potassium chloride SA (KLOR-CON) 20 MEQ tablet Take 20 mEq by mouth daily.    . prochlorperazine (COMPAZINE) 10 MG tablet Take 1 tablet (10 mg total) by mouth every 6 (six) hours as needed for nausea or vomiting. 30 tablet 0  . saccharomyces boulardii (FLORASTOR) 250 MG capsule Take 1 capsule (250 mg total) by mouth 2 (two) times daily. (Patient not taking: Reported  on 06/03/2020) 60 capsule 2  . vitamin B-12 (CYANOCOBALAMIN) 1000 MCG tablet Take 1,000 mcg by mouth every evening.    [name] 20 MG TABS tablet TAKE 1 TABLET BY MOUTH ONCE DAILY IN THE EVENING 90 tablet 0  . zinc oxide (BALMEX) 11.3 % CREA cream Apply 1 application topically as needed (lesions/buttocks).     No current facility-administered medications for this visit.    SURGICAL HISTORY:  Past Surgical History:  Procedure Laterality Date  . BRAIN SURGERY    . INTRAVASCULAR ULTRASOUND/IVUS N/A 02/16/2020   Procedure: Intravascular Ultrasound/IVUS;  Surgeon: Leonie Man, MD;  Location: [facility];  Service: Cardiovascular;  Laterality: N/A;  . LEFT LOWER LOBECTOMY  05/2001  . LEFT OCCIPITAL CRANIOTOMY  09/21/2005   tumor  . LUNG LOBECTOMY    . NECK SURGERY    . ORIF ANKLE FRACTURE Left 10/02/2014   Procedure: OPEN REDUCTION INTERNAL FIXATION (ORIF) ANKLE FRACTURE;  Surgeon: Alta Corning, MD;  Location: [facility];  Service: Orthopedics;  Laterality: Left;  . ORIF FEMUR FRACTURE Right 06/11/2019   Procedure: OPEN REDUCTION INTERNAL FIXATION (ORIF) DISTAL FEMUR FRACTURE;  Surgeon: Renette Butters, MD;  Location: [facility];  Service: Orthopedics;  Laterality: Right;  .  ORIF TIBIA PLATEAU Right 06/11/2019   Procedure: Open Reduction Internal Fixation (Orif) Tibial Plateau;  Surgeon: Renette Butters, MD;  Location: [place] [facility];  Service: Orthopedics;  Laterality: Right;  . RADICAL NECK DISSECTION  10/18/2011   Procedure: RADICAL NECK DISSECTION;  Surgeon: Izora Gala, MD;  Location: [facility];  Service: ENT;  Laterality: Right;  RIGHT MODIFIED NECK DISSECTION /POSSIBLE RIGHT THYROIDECTOM  . RIGHT/LEFT HEART CATH AND CORONARY ANGIOGRAPHY N/A 02/16/2020   Procedure: RIGHT/LEFT HEART CATH AND CORONARY ANGIOGRAPHY;  Surgeon: Leonie Man, MD;  Location: [facility];  Service: Cardiovascular;  Laterality: N/A;  . TEE WITHOUT CARDIOVERSION N/A 03/28/2020   Procedure: TRANSESOPHAGEAL ECHOCARDIOGRAM (TEE);  Surgeon: Buford Dresser, MD;  Location: [facility] ENDOSCOPY;  Service: Cardiovascular;  Laterality: N/A;  . THYROIDECTOMY  10/18/2011   Procedure: THYROIDECTOMY WITH RADICAL NECK DISSECTION;  Surgeon: Izora Gala, MD;  Location: [facility];  Service: ENT;  Laterality: Right;    REVIEW OF SYSTEMS:  Constitutional: positive for fatigue and weight loss Eyes: negative Ears, nose, mouth, throat, and face: negative Respiratory: negative Cardiovascular: negative Gastrointestinal: negative Genitourinary:negative Integument/breast: negative Hematologic/lymphatic: negative Musculoskeletal:positive for bone pain and muscle weakness Neurological: negative Behavioral/Psych: negative Endocrine: negative Allergic/Immunologic: negative   PHYSICAL EXAMINATION: General appearance: alert, cooperative, fatigued and no distress Head: Normocephalic, without obvious abnormality, atraumatic Neck: no adenopathy, supple, symmetrical, trachea midline and thyroid not enlarged, symmetric, no tenderness/mass/nodules Lymph nodes: Cervical, supraclavicular, and axillary nodes normal. Resp: clear to auscultation bilaterally Back: symmetric, no curvature. ROM normal. No CVA tenderness. Cardio:  regular rate and rhythm, S1, S2 normal, no murmur, click, rub or gallop GI: soft, non-tender; bowel sounds normal; no masses,  no organomegaly Extremities: extremities normal, atraumatic, no cyanosis or edema Neurologic: Motor: Weakness of the lower extremities bilaterally  ECOG PERFORMANCE STATUS: 1 - Symptomatic but completely ambulatory  Blood pressure 119/79, pulse 81, temperature 97.9 F (36.6 C), temperature source Tympanic, resp. rate 17, height $RemoveBe'5\' 7"'ixZKsruqn$  (1.702 m), SpO2 100 %.  LABORATORY DATA: Lab Results  Component Value Date   WBC 8.7 06/28/2020   HGB 9.5 (L) 06/28/2020   HCT 31.0 (L) 06/28/2020   MCV 97.5 06/28/2020   PLT 451 (H) 06/28/2020      Chemistry  Component Value Date/Time   NA 138 06/13/2020 0626   NA 140 04/20/2020 0000   NA 141 08/06/2017 1047   K 3.5 06/13/2020 0626   K 3.9 08/06/2017 1047   CL 102 06/13/2020 0626   CL 106 12/09/2012 0806   CO2 28 06/13/2020 0626   CO2 24 08/06/2017 1047   BUN 13 06/13/2020 0626   BUN 18 04/20/2020 0000   BUN 15.4 08/06/2017 1047   CREATININE 0.63 06/13/2020 0626   CREATININE 0.92 04/26/2020 1251   CREATININE 1.0 08/06/2017 1047   GLU 89 04/20/2020 0000      Component Value Date/Time   CALCIUM 8.3 (L) 06/13/2020 0626   CALCIUM 9.8 08/06/2017 1047   ALKPHOS 72 06/13/2020 0626   ALKPHOS 65 08/06/2017 1047   AST 59 (H) 06/13/2020 0626   AST 10 (L) 04/26/2020 1251   AST 11 08/06/2017 1047   ALT 104 (H) 06/13/2020 0626   ALT 10 04/26/2020 1251   ALT 9 08/06/2017 1047   BILITOT 0.4 06/13/2020 0626   BILITOT 0.7 04/26/2020 1251   BILITOT 0.66 08/06/2017 1047       RADIOGRAPHIC STUDIES: CT Angio Chest PE W/Cm &/Or Wo Cm  Result Date: 06/03/2020 CLINICAL DATA:  High probability of pulmonary embolus. Left-sided abdominal pain. EXAM: CT ANGIOGRAPHY CHEST CT ABDOMEN AND PELVIS WITH CONTRAST TECHNIQUE: Multidetector CT imaging of the chest was performed using the standard protocol during bolus administration  of intravenous contrast. Multiplanar CT image reconstructions and MIPs were obtained to evaluate the vascular anatomy. Multidetector CT imaging of the abdomen and pelvis was performed using the standard protocol during bolus administration of intravenous contrast. CONTRAST:  136mL OMNIPAQUE IOHEXOL 350 MG/ML SOLN COMPARISON:  April 26, 2020. FINDINGS: CTA CHEST FINDINGS Cardiovascular: Satisfactory opacification of the pulmonary arteries to the segmental level. No evidence of pulmonary embolism. Mild cardiomegaly is noted. No pericardial effusion. Atherosclerosis of thoracic aorta is noted without aneurysm formation. Mediastinum/Nodes: No enlarged mediastinal, hilar, or axillary lymph nodes. Thyroid gland, trachea, and esophagus demonstrate no significant findings. Lungs/Pleura: No pneumothorax is noted. Status post left lower lobectomy. Scarring and other postsurgical changes are noted posteriorly. Small left pleural effusion is noted. Musculoskeletal: Postsurgical changes are seen involving the left ribs. Moderate thoracic kyphosis is noted. No acute osseous abnormality is noted. Review of the MIP images confirms the above findings. CT ABDOMEN and PELVIS FINDINGS Hepatobiliary: No focal liver abnormality is seen. No gallstones, gallbladder wall thickening, or biliary dilatation. Pancreas: Unremarkable. No pancreatic ductal dilatation or surrounding inflammatory changes. Spleen: Normal in size without focal abnormality. Adrenals/Urinary Tract: Adrenal glands are unremarkable. Kidneys are normal, without renal calculi, focal lesion, or hydronephrosis. Bladder is unremarkable. Stomach/Bowel: Stomach is within normal limits. Appendix appears normal. No evidence of bowel wall thickening, distention, or inflammatory changes. Vascular/Lymphatic: Aortic atherosclerosis. No enlarged abdominal or pelvic lymph nodes. Reproductive: At least 2 uterine fibroids are noted. No adnexal abnormality is noted. Other: No abdominal  wall hernia or abnormality. No abdominopelvic ascites. Musculoskeletal: No acute or significant osseous findings. Review of the MIP images confirms the above findings. IMPRESSION: 1. No definite evidence of pulmonary embolus. 2. Status post left lower lobectomy with postsurgical changes seen posteriorly. 3. Small left pleural effusion is noted. 4. Aortic atherosclerosis. 5. At least 2 uterine fibroids are noted. 6. No acute abnormality seen in the abdomen or pelvis. Aortic Atherosclerosis (ICD10-I70.0). Electronically Signed   By: Marijo Conception M.D.   On: 06/03/2020 13:43   CT Abdomen Pelvis W Contrast  Result Date: 06/03/2020 CLINICAL DATA:  High probability of pulmonary embolus. Left-sided abdominal pain. EXAM: CT ANGIOGRAPHY CHEST CT ABDOMEN AND PELVIS WITH CONTRAST TECHNIQUE: Multidetector CT imaging of the chest was performed using the standard protocol during bolus administration of intravenous contrast. Multiplanar CT image reconstructions and MIPs were obtained to evaluate the vascular anatomy. Multidetector CT imaging of the abdomen and pelvis was performed using the standard protocol during bolus administration of intravenous contrast. CONTRAST:  159mL OMNIPAQUE IOHEXOL 350 MG/ML SOLN COMPARISON:  April 26, 2020. FINDINGS: CTA CHEST FINDINGS Cardiovascular: Satisfactory opacification of the pulmonary arteries to the segmental level. No evidence of pulmonary embolism. Mild cardiomegaly is noted. No pericardial effusion. Atherosclerosis of thoracic aorta is noted without aneurysm formation. Mediastinum/Nodes: No enlarged mediastinal, hilar, or axillary lymph nodes. Thyroid gland, trachea, and esophagus demonstrate no significant findings. Lungs/Pleura: No pneumothorax is noted. Status post left lower lobectomy. Scarring and other postsurgical changes are noted posteriorly. Small left pleural effusion is noted. Musculoskeletal: Postsurgical changes are seen involving the left ribs. Moderate thoracic  kyphosis is noted. No acute osseous abnormality is noted. Review of the MIP images confirms the above findings. CT ABDOMEN and PELVIS FINDINGS Hepatobiliary: No focal liver abnormality is seen. No gallstones, gallbladder wall thickening, or biliary dilatation. Pancreas: Unremarkable. No pancreatic ductal dilatation or surrounding inflammatory changes. Spleen: Normal in size without focal abnormality. Adrenals/Urinary Tract: Adrenal glands are unremarkable. Kidneys are normal, without renal calculi, focal lesion, or hydronephrosis. Bladder is unremarkable. Stomach/Bowel: Stomach is within normal limits. Appendix appears normal. No evidence of bowel wall thickening, distention, or inflammatory changes. Vascular/Lymphatic: Aortic atherosclerosis. No enlarged abdominal or pelvic lymph nodes. Reproductive: At least 2 uterine fibroids are noted. No adnexal abnormality is noted. Other: No abdominal wall hernia or abnormality. No abdominopelvic ascites. Musculoskeletal: No acute or significant osseous findings. Review of the MIP images confirms the above findings. IMPRESSION: 1. No definite evidence of pulmonary embolus. 2. Status post left lower lobectomy with postsurgical changes seen posteriorly. 3. Small left pleural effusion is noted. 4. Aortic atherosclerosis. 5. At least 2 uterine fibroids are noted. 6. No acute abnormality seen in the abdomen or pelvis. Aortic Atherosclerosis (ICD10-I70.0). Electronically Signed   By: Marijo Conception M.D.   On: 06/03/2020 13:43   DG Chest Port 1 View  Result Date: 06/03/2020 CLINICAL DATA:  Sepsis. EXAM: PORTABLE CHEST 1 VIEW COMPARISON:  March 22, 2020.  April 26, 2020. FINDINGS: Stable cardiomegaly. No pneumothorax is noted. Right lung is clear. Mild left pleural effusion is noted with associated left basilar atelectasis or infiltrate. Postsurgical changes are noted in the left hilar region. Old left rib fractures are noted. IMPRESSION: Mild left pleural effusion is noted  with associated left basilar atelectasis or infiltrate. No pneumothorax is noted. Electronically Signed   By: Marijo Conception M.D.   On: 06/03/2020 11:26   VAS [place] LOWER EXTREMITY VENOUS (DVT)  Result Date: 06/13/2020  Lower Venous DVTStudy Indications: Edema.  Comparison Study: No prior study Performing Technologist: Maudry Mayhew MHA, RDMS, RVT, RDCS  Examination Guidelines: A complete evaluation includes B-mode imaging, spectral Doppler, color Doppler, and power Doppler as needed of all accessible portions of each vessel. Bilateral testing is considered an integral part of a complete examination. Limited examinations for reoccurring indications may be performed as noted. The reflux portion of the exam is performed with the patient in reverse Trendelenburg.  +---------+---------------+---------+-----------+----------+--------------+ RIGHT    CompressibilityPhasicitySpontaneityPropertiesThrombus Aging +---------+---------------+---------+-----------+----------+--------------+ CFV      Full  Yes      Yes                                 +---------+---------------+---------+-----------+----------+--------------+ SFJ      Full                                                        +---------+---------------+---------+-----------+----------+--------------+ FV Prox  Full                                                        +---------+---------------+---------+-----------+----------+--------------+ FV Mid   Full                                                        +---------+---------------+---------+-----------+----------+--------------+ FV DistalFull                                                        +---------+---------------+---------+-----------+----------+--------------+ PFV      Full                                                        +---------+---------------+---------+-----------+----------+--------------+ POP      Full            Yes      Yes                                 +---------+---------------+---------+-----------+----------+--------------+ PTV      Full                                                        +---------+---------------+---------+-----------+----------+--------------+ PERO     Full                                                        +---------+---------------+---------+-----------+----------+--------------+   +---------+---------------+---------+-----------+----------+--------------+ LEFT     CompressibilityPhasicitySpontaneityPropertiesThrombus Aging +---------+---------------+---------+-----------+----------+--------------+ CFV      Full           Yes      Yes                                 +---------+---------------+---------+-----------+----------+--------------+ SFJ  Full                                                        +---------+---------------+---------+-----------+----------+--------------+ FV Prox  Full                                                        +---------+---------------+---------+-----------+----------+--------------+ FV Mid   Full                                                        +---------+---------------+---------+-----------+----------+--------------+ FV DistalFull                                                        +---------+---------------+---------+-----------+----------+--------------+ PFV      Full                                                        +---------+---------------+---------+-----------+----------+--------------+ POP      Full           Yes      Yes                                 +---------+---------------+---------+-----------+----------+--------------+ PTV      Full                                                        +---------+---------------+---------+-----------+----------+--------------+ PERO     Full                                                         +---------+---------------+---------+-----------+----------+--------------+     Summary: RIGHT: - There is no evidence of deep vein thrombosis in the lower extremity.  - No cystic structure found in the popliteal fossa.  LEFT: - There is no evidence of deep vein thrombosis in the lower extremity.  - No cystic structure found in the popliteal fossa.  *See table(s) above for measurements and observations. Electronically signed by Ruta Hinds MD on 06/13/2020 at 6:26:39 PM.    Final    [place] EKG SITE RITE  Result Date: 06/11/2020 If Site Rite image not attached, placement could not be confirmed due to current cardiac rhythm.   ASSESSMENT AND PLAN: This is a very pleasant 74 years old African-American  female with metastatic non-small cell lung cancer, adenocarcinoma with positive EGFR mutation status post several years of treatment with Tarceva and the patient developed positive resistant mutation, T790M. She has been on treatment with Tagrisso for the last 5 years, status post 62 months of treatment.  She has been tolerating her treatment with Tagrisso well but she was recently admitted to [facility] with shortness of breath and she was diagnosed with congestive heart failure secondary to cardiomyopathy with ejection fraction of 20-25%.  This is likely secondary to her treatment with Tagrisso. I discontinued her treatment with Tagrisso on 02/13/2020. The patient is currently on treatment with Tarceva 150 mg p.o. daily started 4 months ago with some interruption because of her hospitalization. Her last CT scan of the chest showed no concerning findings for disease progression. I had a lengthy discussion with the patient and her husband today about her condition and treatment options.  I discussed with the patient the option of palliative care and hospice referral but she refused this option.  She would like to continue some form of treatment.  She is interested in continuing her treatment with  Tarceva at a lower dose.  I will start her on Tarceva 100 mg p.o. daily.  I encouraged the patient to consider quality of life more than quantity at this point.  She has multiple comorbidities and she had frequent hospitalization that unrelated to the lung cancer.  For the anemia of chronic disease, she will continue her current treatment with oral iron tablet. I will see her back for follow-up visit in 1 months for evaluation and repeat blood work and management of any adverse effect of her treatment. The patient was advised to call immediately if she has any concerning symptoms in the interval. The patient voices understanding of current disease status and treatment options and is in agreement with the current care plan. All questions were answered. The patient knows to call the clinic with any problems, questions or concerns. We can certainly see the patient much sooner if necessary.  Disclaimer: This note was dictated with voice recognition software. Similar sounding words can inadvertently be transcribed and may not be corrected upon review.

## 2020-06-30 DIAGNOSIS — E440 Moderate protein-calorie malnutrition: Secondary | ICD-10-CM | POA: Diagnosis not present

## 2020-06-30 DIAGNOSIS — I5042 Chronic combined systolic (congestive) and diastolic (congestive) heart failure: Secondary | ICD-10-CM | POA: Diagnosis not present

## 2020-06-30 DIAGNOSIS — C719 Malignant neoplasm of brain, unspecified: Secondary | ICD-10-CM | POA: Diagnosis not present

## 2020-06-30 DIAGNOSIS — M6281 Muscle weakness (generalized): Secondary | ICD-10-CM | POA: Diagnosis not present

## 2020-06-30 DIAGNOSIS — R21 Rash and other nonspecific skin eruption: Secondary | ICD-10-CM | POA: Diagnosis not present

## 2020-06-30 DIAGNOSIS — F0390 Unspecified dementia without behavioral disturbance: Secondary | ICD-10-CM | POA: Diagnosis not present

## 2020-06-30 DIAGNOSIS — C7800 Secondary malignant neoplasm of unspecified lung: Secondary | ICD-10-CM | POA: Diagnosis not present

## 2020-06-30 DIAGNOSIS — I480 Paroxysmal atrial fibrillation: Secondary | ICD-10-CM | POA: Diagnosis not present

## 2020-06-30 DIAGNOSIS — R627 Adult failure to thrive: Secondary | ICD-10-CM | POA: Diagnosis not present

## 2020-07-01 ENCOUNTER — Encounter: Payer: Self-pay | Admitting: *Deleted

## 2020-07-01 NOTE — Progress Notes (Signed)
   Patient was referred for our home-based palliative care program, [facility], a division of [facility].  Patient's husband is receptive to this program, which would provide nurse and social work support at home.  Plan made to contact him at the first of next week to schedule a visit.  Ronnell [place] Referral Specialist [phone]

## 2020-07-05 MED FILL — ERLOTINIB HCL 100 MG TABS: 100 | 30 days supply | Qty: 30 | Fill #0

## 2020-07-07 NOTE — Progress Notes (Signed)
   Spoke with patient's spouse, Valerie [name] regarding [employer] program and he declines the service at this time.  [facility] is the Home-Based Palliative Care division of [facility].  Ronnell Gu[place]ara Referral Specialist [phone]

## 2020-07-08 ENCOUNTER — Telehealth: Payer: Medicare PPO

## 2020-07-08 DIAGNOSIS — I420 Dilated cardiomyopathy: Secondary | ICD-10-CM | POA: Diagnosis not present

## 2020-07-08 DIAGNOSIS — F0390 Unspecified dementia without behavioral disturbance: Secondary | ICD-10-CM | POA: Diagnosis not present

## 2020-07-08 DIAGNOSIS — C3411 Malignant neoplasm of upper lobe, right bronchus or lung: Secondary | ICD-10-CM | POA: Diagnosis not present

## 2020-07-08 DIAGNOSIS — C719 Malignant neoplasm of brain, unspecified: Secondary | ICD-10-CM | POA: Diagnosis not present

## 2020-07-08 DIAGNOSIS — I2510 Atherosclerotic heart disease of native coronary artery without angina pectoris: Secondary | ICD-10-CM | POA: Diagnosis not present

## 2020-07-08 DIAGNOSIS — S72401D Unspecified fracture of lower end of right femur, subsequent encounter for closed fracture with routine healing: Secondary | ICD-10-CM | POA: Diagnosis not present

## 2020-07-08 DIAGNOSIS — I5040 Unspecified combined systolic (congestive) and diastolic (congestive) heart failure: Secondary | ICD-10-CM | POA: Diagnosis not present

## 2020-07-08 DIAGNOSIS — M810 Age-related osteoporosis without current pathological fracture: Secondary | ICD-10-CM | POA: Diagnosis not present

## 2020-07-08 DIAGNOSIS — J45909 Unspecified asthma, uncomplicated: Secondary | ICD-10-CM | POA: Diagnosis not present

## 2020-07-12 DIAGNOSIS — F0390 Unspecified dementia without behavioral disturbance: Secondary | ICD-10-CM | POA: Diagnosis not present

## 2020-07-12 DIAGNOSIS — I5040 Unspecified combined systolic (congestive) and diastolic (congestive) heart failure: Secondary | ICD-10-CM | POA: Diagnosis not present

## 2020-07-12 DIAGNOSIS — J45909 Unspecified asthma, uncomplicated: Secondary | ICD-10-CM | POA: Diagnosis not present

## 2020-07-12 DIAGNOSIS — M810 Age-related osteoporosis without current pathological fracture: Secondary | ICD-10-CM | POA: Diagnosis not present

## 2020-07-12 DIAGNOSIS — C3411 Malignant neoplasm of upper lobe, right bronchus or lung: Secondary | ICD-10-CM | POA: Diagnosis not present

## 2020-07-12 DIAGNOSIS — I2510 Atherosclerotic heart disease of native coronary artery without angina pectoris: Secondary | ICD-10-CM | POA: Diagnosis not present

## 2020-07-12 DIAGNOSIS — I420 Dilated cardiomyopathy: Secondary | ICD-10-CM | POA: Diagnosis not present

## 2020-07-12 DIAGNOSIS — S72401D Unspecified fracture of lower end of right femur, subsequent encounter for closed fracture with routine healing: Secondary | ICD-10-CM | POA: Diagnosis not present

## 2020-07-12 DIAGNOSIS — C719 Malignant neoplasm of brain, unspecified: Secondary | ICD-10-CM | POA: Diagnosis not present

## 2020-07-16 DIAGNOSIS — C719 Malignant neoplasm of brain, unspecified: Secondary | ICD-10-CM | POA: Diagnosis not present

## 2020-07-16 DIAGNOSIS — I420 Dilated cardiomyopathy: Secondary | ICD-10-CM | POA: Diagnosis not present

## 2020-07-16 DIAGNOSIS — C3411 Malignant neoplasm of upper lobe, right bronchus or lung: Secondary | ICD-10-CM | POA: Diagnosis not present

## 2020-07-16 DIAGNOSIS — I5040 Unspecified combined systolic (congestive) and diastolic (congestive) heart failure: Secondary | ICD-10-CM | POA: Diagnosis not present

## 2020-07-16 DIAGNOSIS — I2510 Atherosclerotic heart disease of native coronary artery without angina pectoris: Secondary | ICD-10-CM | POA: Diagnosis not present

## 2020-07-16 DIAGNOSIS — M810 Age-related osteoporosis without current pathological fracture: Secondary | ICD-10-CM | POA: Diagnosis not present

## 2020-07-16 DIAGNOSIS — F0390 Unspecified dementia without behavioral disturbance: Secondary | ICD-10-CM | POA: Diagnosis not present

## 2020-07-16 DIAGNOSIS — J45909 Unspecified asthma, uncomplicated: Secondary | ICD-10-CM | POA: Diagnosis not present

## 2020-07-16 DIAGNOSIS — S72401D Unspecified fracture of lower end of right femur, subsequent encounter for closed fracture with routine healing: Secondary | ICD-10-CM | POA: Diagnosis not present

## 2020-07-17 DIAGNOSIS — S72401A Unspecified fracture of lower end of right femur, initial encounter for closed fracture: Secondary | ICD-10-CM | POA: Diagnosis not present

## 2020-07-18 DIAGNOSIS — I420 Dilated cardiomyopathy: Secondary | ICD-10-CM | POA: Diagnosis not present

## 2020-07-18 DIAGNOSIS — C719 Malignant neoplasm of brain, unspecified: Secondary | ICD-10-CM | POA: Diagnosis not present

## 2020-07-18 DIAGNOSIS — S72401D Unspecified fracture of lower end of right femur, subsequent encounter for closed fracture with routine healing: Secondary | ICD-10-CM | POA: Diagnosis not present

## 2020-07-18 DIAGNOSIS — J45909 Unspecified asthma, uncomplicated: Secondary | ICD-10-CM | POA: Diagnosis not present

## 2020-07-18 DIAGNOSIS — M810 Age-related osteoporosis without current pathological fracture: Secondary | ICD-10-CM | POA: Diagnosis not present

## 2020-07-18 DIAGNOSIS — I2510 Atherosclerotic heart disease of native coronary artery without angina pectoris: Secondary | ICD-10-CM | POA: Diagnosis not present

## 2020-07-18 DIAGNOSIS — C3411 Malignant neoplasm of upper lobe, right bronchus or lung: Secondary | ICD-10-CM | POA: Diagnosis not present

## 2020-07-18 DIAGNOSIS — I5040 Unspecified combined systolic (congestive) and diastolic (congestive) heart failure: Secondary | ICD-10-CM | POA: Diagnosis not present

## 2020-07-18 DIAGNOSIS — F0390 Unspecified dementia without behavioral disturbance: Secondary | ICD-10-CM | POA: Diagnosis not present

## 2020-07-19 ENCOUNTER — Ambulatory Visit: Payer: Medicare PPO | Admitting: Internal Medicine

## 2020-07-19 ENCOUNTER — Encounter: Payer: Self-pay | Admitting: Internal Medicine

## 2020-07-19 ENCOUNTER — Other Ambulatory Visit: Payer: Self-pay

## 2020-07-19 VITALS — BP 114/66 | HR 100 | Temp 97.8°F | Ht 67.0 in

## 2020-07-19 DIAGNOSIS — N390 Urinary tract infection, site not specified: Secondary | ICD-10-CM

## 2020-07-19 DIAGNOSIS — I700 Atherosclerosis of aorta: Secondary | ICD-10-CM | POA: Diagnosis not present

## 2020-07-19 DIAGNOSIS — R269 Unspecified abnormalities of gait and mobility: Secondary | ICD-10-CM | POA: Diagnosis not present

## 2020-07-19 DIAGNOSIS — R21 Rash and other nonspecific skin eruption: Secondary | ICD-10-CM | POA: Diagnosis not present

## 2020-07-19 DIAGNOSIS — C3411 Malignant neoplasm of upper lobe, right bronchus or lung: Secondary | ICD-10-CM

## 2020-07-19 DIAGNOSIS — Z23 Encounter for immunization: Secondary | ICD-10-CM | POA: Diagnosis not present

## 2020-07-19 MED ORDER — CIPROFLOXACIN HCL 500 MG PO TABS: 500.0000 mg | ORAL_TABLET | Freq: Two times a day (BID) | ORAL | 0 refills | Status: AC

## 2020-07-19 NOTE — Progress Notes (Signed)
I,Katawbba Wiggins,acting as a Education administrator for [name], MD.,have documented all relevant documentation on the behalf of [name], MD,as directed by  [name], MD while in the presence of [name], MD.  This visit occurred during the SARS-CoV-2 public health emergency.  Safety protocols were in place, including screening questions prior to the visit, additional usage of staff PPE, and extensive cleaning of exam room while observing appropriate contact time as indicated for disinfecting solutions.  Subjective:     Patient ID: Valerie [name] , female    DOB: 10-30-1945 , 74 y.o.   MRN: [id number]   Chief Complaint  Patient presents with  . Hospitalization Follow-up  . Urinary Tract Infection    HPI  The patient is here today for a hospital follow-up. She was transported by [facility]; however, her husband is also present for the visit. She was admitted to [facility] hospital on 10/15 for further evaluation of weakness. left-sided flank and back pain. For weeks prior to her presentation, she reported progressive weakness and immobility.  She has been having poor p.o. intake, nausea, vomiting, diarrhea for a few weeks prior to admission. During her hospitalization, she was found to be septic secondary to cellulitis. This was thought to be due to Tarceva, her treatment for metastatic non-small cell lung cancer. She was previously on Tagrisso, but this was stopped due to cardiomyopathy.Regarding the cellulitis, she was treated with vancomycin, meropenem and then switched to oral Keflex. The rash seemed to improve and she was discharged to SNF on 10/25. Unfortunately, she did not notify us when she went home - she thinks was around 11/19 or 11/20. She is currently being followed by [facility].  Pt examined in waiting room because she was afraid she was going to miss her [facility] transportation and would not make it home. States she is currently unable to ride in car with  her husband because she is too weak. Wants to get referral to outpatient PT.     Past Medical History:  Diagnosis Date  . Abnormal Pap smear 2006  . Anal fistula   . Ankle fracture   . Anxiety   . Arthritis   . Atrophic vaginitis 2008  . Cataract   . Dementia ([place]) 2009  . Dyspareunia 2008  . H/O osteoporosis   . H/O varicella   . H/O vitamin D deficiency   . Headache 07/24/2016  . Heart murmur   . History of measles, mumps, or rubella   . History of radiation therapy 07/28/13- 08/10/13   right lung metastasis 5000 cGy 10 sessions  . Hypertension   . Lung cancer ([place]) dx'd 2002  . Lung cancer ([place])   . Lung cancer ([place])   . Lung metastasis ([name])    PET scan 05/05/13, RUL lung nodule  . 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 Nodule of right lung CT- 06/03/12   RIGHT UPPER LOBE  . Nodule of right lung 06/03/12   Upper Lobe  . On antineoplastic chemotherapy    TARCEVA  . Osteoporosis 2010  . Primary cancer of right upper lobe of lung ([place]) 04/22/2009   Qualifier: Diagnosis of  By: Nils Pyle CMA ([place]), Mearl Latin    . Shortness of breath    hx lung ca   . Status post chemotherapy 2003   CARBOPLATIN/PACLITAXEL /STATUS POST CLINICAL TRAIL OF CELEBREX AND IRESSA AT [facility] FOR 1 YEAR  .  Status post radiation therapy 2003   LEFT LUNG  . Status post radiation therapy 11/07/2005   WHOLE BRAIN: DR [facility]arkin [facility]  . Status post radiation therapy 06/02/2008   GAMMA KNIFE OF RESECTED CAVITAY  . Thyroid adenoma    ?  [name] Thyroid cancer ([place]) 10/18/11 bx   adenoid nodules   . Yeast infection      Family History  Problem Relation Age of Onset  . Gait disorder Mother   . Cancer Mother        Meningioma  . Cancer Father        Pancreatic  . Heart failure Sister   . Cancer Maternal Aunt        menigeoma     Current Outpatient Medications:  .  Ascorbic Acid (VITAMIN C) 100 MG tablet, Take 100 mg by mouth every evening. Gummie, Disp: , Rfl:   .  Cholecalciferol (D3 PO), Take 1 capsule by mouth daily., Disp: , Rfl:  .  clopidogrel (PLAVIX) 75 MG tablet, Take 1 tablet (75 mg total) by mouth daily with breakfast., Disp: 90 tablet, Rfl: 3 .  diphenhydrAMINE (BENADRYL) 25 MG tablet, Take 25 mg by mouth every 6 (six) hours as needed for allergies., Disp: , Rfl:  .  donepezil (ARICEPT) 10 MG tablet, Take 10 mg by mouth at bedtime., Disp: , Rfl:  .  erlotinib (TARCEVA) 100 MG tablet, Take 1 tablet (100 mg total) by mouth daily. Take on an empty stomach 1 hour before meals or 2 hours after, Disp: 30 tablet, Rfl: 2 .  hydrocerin (EUCERIN) CREA, Apply 1 application topically 2 (two) times daily. To back and buttocks., Disp: 113 g, Rfl: 0 .  loperamide (IMODIUM A-D) 2 MG tablet, Take 4 mg by mouth in the morning and at bedtime., Disp: , Rfl:  .  MAGNESIUM PO, Take 1 tablet by mouth daily., Disp: , Rfl:  .  Multiple Vitamin (MULTI-VITAMINS) TABS, Take 1 tablet by mouth every evening. , Disp: , Rfl:  .  vitamin B-12 (CYANOCOBALAMIN) 1000 MCG tablet, Take 1,000 mcg by mouth every evening., Disp: , Rfl:  .  XARELTO 20 MG TABS tablet, TAKE 1 TABLET BY MOUTH ONCE DAILY IN THE EVENING, Disp: 90 tablet, Rfl: 0 .  zinc oxide (BALMEX) 11.3 % CREA cream, Apply 1 application topically as needed (lesions/buttocks)., Disp: , Rfl:  .  metoprolol succinate (TOPROL-XL) 25 MG 24 hr tablet, Take 1 tablet (25 mg total) by mouth daily., Disp: 90 tablet, Rfl: 3 .  Nystatin (GERHARDT'S BUTT CREAM) CREA, Apply 1 application topically 2 (two) times daily. (Patient not taking: Reported on 07/19/2020), Disp: 1 each, Rfl: 1 .  nystatin (MYCOSTATIN/NYSTOP) powder, Apply topically 2 (two) times daily. To breast folds and groin (Patient not taking: Reported on 07/19/2020), Disp: 15 g, Rfl: 0 .  potassium chloride SA (KLOR-CON) 20 MEQ tablet, Take 20 mEq by mouth daily., Disp: , Rfl:  .  prochlorperazine (COMPAZINE) 10 MG tablet, Take 1 tablet (10 mg total) by mouth every 6 (six)  hours as needed for nausea or vomiting., Disp: 30 tablet, Rfl: 0   No Known Allergies   Review of Systems  Constitutional: Positive for fatigue.  HENT: Negative.   Eyes: Negative.   Respiratory: Negative.   Cardiovascular: Negative.   Endocrine: Negative.   Genitourinary: Positive for dysuria.  Skin: Negative.   Allergic/Immunologic: Negative.   Neurological: Positive for weakness.       She and her husband would like to start outpatient  rehab. This was initially ordered by Ortho, but they have yet to hear about referral. Does not feel she is making gains she should with in-home PT.   Hematological: Negative.   Psychiatric/Behavioral: Negative.      Today's Vitals   07/19/20 1549  BP: 114/66  Pulse: 100  Temp: 97.8 F (36.6 C)  TempSrc: Oral  Height: 5\' 7"  (1.702 m)  PainSc: 2   PainLoc: Buttocks   Body mass index is 20.36 kg/m.  Wt Readings from Last 3 Encounters:  06/03/20 130 lb (59 kg)  03/23/20 153 lb 12.8 oz (69.8 kg)  02/24/20 163 lb (73.9 kg)   Objective:  Physical Exam Constitutional:      General: She is not in acute distress.    Appearance: Normal appearance. She is well-developed.     Comments: Examined in wheelchair  HENT:     Head: Normocephalic and atraumatic.     Right Ear: Hearing normal. There is no impacted cerumen.     Left Ear: Hearing normal. There is no impacted cerumen.     Nose:     Comments: Deferred, masked    Mouth/Throat:     Comments: Deferred, masked Eyes:     General: Lids are normal.     Extraocular Movements: Extraocular movements intact.  Neck:     Thyroid: No thyroid mass.     Vascular: No carotid bruit.  Cardiovascular:     Rate and Rhythm: Normal rate and regular rhythm.     Heart sounds: Normal heart sounds. No murmur heard.   Pulmonary:     Effort: Pulmonary effort is normal.     Breath sounds: Normal breath sounds.  Abdominal:     General: Bowel sounds are normal. There is no distension.     Palpations:  Abdomen is soft.     Tenderness: There is no abdominal tenderness.  Musculoskeletal:        General: No swelling.     Cervical back: Full passive range of motion without pain, normal range of motion and neck supple.     Right lower leg: No edema.     Left lower leg: No edema.  Skin:    General: Skin is warm.     Capillary Refill: Capillary refill takes less than 2 seconds.  Neurological:     Mental Status: She is alert and oriented to person, place, and time.     Cranial Nerves: No cranial nerve deficit.     Sensory: No sensory deficit.     Motor: Weakness present.     Comments: Gait not assessed. In wheelchair  Psychiatric:        Mood and Affect: Mood normal.        Behavior: Behavior normal.         Assessment And Plan:     1. Recurrent UTI Comments: Pt has started Cipro already, will send in rx so she can complete 7 days of therapy. I will have home health recheck u/a and culture in 10-14 days.   2. Rash and nonspecific skin eruption Comments: Improved as per pt/husband.   3. Atherosclerosis of aorta ([place]) Comments: Encouraged to follow a heart healthy lifestyle. Advised to avoid fried foods.Chronic, will check lipid panel (future).  - Lipid panel; Future  4. Gait abnormality Comments: She feels she is now strong enough to pursue outpatient PT. I will refer her to [facility] as requested by her husband.  - Ambulatory referral to Physical Therapy  5. Need for  vaccination Comments: She was given high dose flu vaccine.  - Flu Vaccine QUAD High Dose(Fluad)     Patient was given opportunity to ask questions. Patient verbalized understanding of the plan and was able to repeat key elements of the plan. All questions were answered to their satisfaction.  [name], MD   I, [name], MD, have reviewed all documentation for this visit. The documentation on 07/31/20 for the exam, diagnosis, procedures, and orders are all accurate and complete.  THE PATIENT  IS ENCOURAGED TO PRACTICE SOCIAL DISTANCING DUE TO THE COVID-19 PANDEMIC.

## 2020-07-20 DIAGNOSIS — M810 Age-related osteoporosis without current pathological fracture: Secondary | ICD-10-CM | POA: Diagnosis not present

## 2020-07-20 DIAGNOSIS — C719 Malignant neoplasm of brain, unspecified: Secondary | ICD-10-CM | POA: Diagnosis not present

## 2020-07-20 DIAGNOSIS — S72401D Unspecified fracture of lower end of right femur, subsequent encounter for closed fracture with routine healing: Secondary | ICD-10-CM | POA: Diagnosis not present

## 2020-07-20 DIAGNOSIS — J45909 Unspecified asthma, uncomplicated: Secondary | ICD-10-CM | POA: Diagnosis not present

## 2020-07-20 DIAGNOSIS — I420 Dilated cardiomyopathy: Secondary | ICD-10-CM | POA: Diagnosis not present

## 2020-07-20 DIAGNOSIS — C3411 Malignant neoplasm of upper lobe, right bronchus or lung: Secondary | ICD-10-CM | POA: Diagnosis not present

## 2020-07-20 DIAGNOSIS — F0390 Unspecified dementia without behavioral disturbance: Secondary | ICD-10-CM | POA: Diagnosis not present

## 2020-07-20 DIAGNOSIS — I5040 Unspecified combined systolic (congestive) and diastolic (congestive) heart failure: Secondary | ICD-10-CM | POA: Diagnosis not present

## 2020-07-20 DIAGNOSIS — I2510 Atherosclerotic heart disease of native coronary artery without angina pectoris: Secondary | ICD-10-CM | POA: Diagnosis not present

## 2020-07-22 DIAGNOSIS — F0390 Unspecified dementia without behavioral disturbance: Secondary | ICD-10-CM | POA: Diagnosis not present

## 2020-07-22 DIAGNOSIS — C719 Malignant neoplasm of brain, unspecified: Secondary | ICD-10-CM | POA: Diagnosis not present

## 2020-07-22 DIAGNOSIS — R54 Age-related physical debility: Secondary | ICD-10-CM | POA: Diagnosis not present

## 2020-07-22 DIAGNOSIS — C3411 Malignant neoplasm of upper lobe, right bronchus or lung: Secondary | ICD-10-CM | POA: Diagnosis not present

## 2020-07-22 DIAGNOSIS — J45909 Unspecified asthma, uncomplicated: Secondary | ICD-10-CM | POA: Diagnosis not present

## 2020-07-22 DIAGNOSIS — M810 Age-related osteoporosis without current pathological fracture: Secondary | ICD-10-CM | POA: Diagnosis not present

## 2020-07-22 DIAGNOSIS — I5040 Unspecified combined systolic (congestive) and diastolic (congestive) heart failure: Secondary | ICD-10-CM | POA: Diagnosis not present

## 2020-07-22 DIAGNOSIS — I2510 Atherosclerotic heart disease of native coronary artery without angina pectoris: Secondary | ICD-10-CM | POA: Diagnosis not present

## 2020-07-22 DIAGNOSIS — S72401D Unspecified fracture of lower end of right femur, subsequent encounter for closed fracture with routine healing: Secondary | ICD-10-CM | POA: Diagnosis not present

## 2020-07-22 DIAGNOSIS — I420 Dilated cardiomyopathy: Secondary | ICD-10-CM | POA: Diagnosis not present

## 2020-07-26 DIAGNOSIS — S72401D Unspecified fracture of lower end of right femur, subsequent encounter for closed fracture with routine healing: Secondary | ICD-10-CM | POA: Diagnosis not present

## 2020-07-26 DIAGNOSIS — I5040 Unspecified combined systolic (congestive) and diastolic (congestive) heart failure: Secondary | ICD-10-CM | POA: Diagnosis not present

## 2020-07-26 DIAGNOSIS — M810 Age-related osteoporosis without current pathological fracture: Secondary | ICD-10-CM | POA: Diagnosis not present

## 2020-07-26 DIAGNOSIS — C719 Malignant neoplasm of brain, unspecified: Secondary | ICD-10-CM | POA: Diagnosis not present

## 2020-07-26 DIAGNOSIS — F0390 Unspecified dementia without behavioral disturbance: Secondary | ICD-10-CM | POA: Diagnosis not present

## 2020-07-26 DIAGNOSIS — I420 Dilated cardiomyopathy: Secondary | ICD-10-CM | POA: Diagnosis not present

## 2020-07-26 DIAGNOSIS — I2510 Atherosclerotic heart disease of native coronary artery without angina pectoris: Secondary | ICD-10-CM | POA: Diagnosis not present

## 2020-07-26 DIAGNOSIS — C3411 Malignant neoplasm of upper lobe, right bronchus or lung: Secondary | ICD-10-CM | POA: Diagnosis not present

## 2020-07-26 DIAGNOSIS — J45909 Unspecified asthma, uncomplicated: Secondary | ICD-10-CM | POA: Diagnosis not present

## 2020-07-28 MED FILL — ERLOTINIB HCL 100 MG TABS: 100 | 30 days supply | Qty: 30 | Fill #1

## 2020-07-29 DIAGNOSIS — I2510 Atherosclerotic heart disease of native coronary artery without angina pectoris: Secondary | ICD-10-CM | POA: Diagnosis not present

## 2020-07-29 DIAGNOSIS — C3411 Malignant neoplasm of upper lobe, right bronchus or lung: Secondary | ICD-10-CM | POA: Diagnosis not present

## 2020-07-29 DIAGNOSIS — J45909 Unspecified asthma, uncomplicated: Secondary | ICD-10-CM | POA: Diagnosis not present

## 2020-07-29 DIAGNOSIS — C719 Malignant neoplasm of brain, unspecified: Secondary | ICD-10-CM | POA: Diagnosis not present

## 2020-07-29 DIAGNOSIS — F0390 Unspecified dementia without behavioral disturbance: Secondary | ICD-10-CM | POA: Diagnosis not present

## 2020-07-29 DIAGNOSIS — I5040 Unspecified combined systolic (congestive) and diastolic (congestive) heart failure: Secondary | ICD-10-CM | POA: Diagnosis not present

## 2020-07-29 DIAGNOSIS — I420 Dilated cardiomyopathy: Secondary | ICD-10-CM | POA: Diagnosis not present

## 2020-07-29 DIAGNOSIS — S72401D Unspecified fracture of lower end of right femur, subsequent encounter for closed fracture with routine healing: Secondary | ICD-10-CM | POA: Diagnosis not present

## 2020-07-29 DIAGNOSIS — M810 Age-related osteoporosis without current pathological fracture: Secondary | ICD-10-CM | POA: Diagnosis not present

## 2020-08-01 ENCOUNTER — Telehealth: Payer: Self-pay | Admitting: Internal Medicine

## 2020-08-01 ENCOUNTER — Other Ambulatory Visit: Payer: Self-pay

## 2020-08-01 ENCOUNTER — Inpatient Hospital Stay: Payer: Medicare PPO | Attending: Internal Medicine | Admitting: Internal Medicine

## 2020-08-01 ENCOUNTER — Encounter: Payer: Self-pay | Admitting: Internal Medicine

## 2020-08-01 ENCOUNTER — Inpatient Hospital Stay: Payer: Medicare PPO

## 2020-08-01 VITALS — BP 106/69 | HR 80 | Temp 97.5°F | Resp 18

## 2020-08-01 DIAGNOSIS — C3432 Malignant neoplasm of lower lobe, left bronchus or lung: Secondary | ICD-10-CM | POA: Insufficient documentation

## 2020-08-01 DIAGNOSIS — C3490 Malignant neoplasm of unspecified part of unspecified bronchus or lung: Secondary | ICD-10-CM | POA: Diagnosis not present

## 2020-08-01 DIAGNOSIS — Z808 Family history of malignant neoplasm of other organs or systems: Secondary | ICD-10-CM | POA: Insufficient documentation

## 2020-08-01 DIAGNOSIS — C7931 Secondary malignant neoplasm of brain: Secondary | ICD-10-CM | POA: Insufficient documentation

## 2020-08-01 DIAGNOSIS — Z5111 Encounter for antineoplastic chemotherapy: Secondary | ICD-10-CM

## 2020-08-01 DIAGNOSIS — R531 Weakness: Secondary | ICD-10-CM | POA: Diagnosis not present

## 2020-08-01 DIAGNOSIS — Z9049 Acquired absence of other specified parts of digestive tract: Secondary | ICD-10-CM | POA: Diagnosis not present

## 2020-08-01 DIAGNOSIS — Z79899 Other long term (current) drug therapy: Secondary | ICD-10-CM | POA: Insufficient documentation

## 2020-08-01 DIAGNOSIS — C3411 Malignant neoplasm of upper lobe, right bronchus or lung: Secondary | ICD-10-CM | POA: Diagnosis not present

## 2020-08-01 LAB — CMP (CANCER CENTER ONLY)
ALT: 9 U/L (ref 0–44)
AST: 11 U/L — ABNORMAL LOW (ref 15–41)
Albumin: 3.2 g/dL — ABNORMAL LOW (ref 3.5–5.0)
Alkaline Phosphatase: 91 U/L (ref 38–126)
Anion gap: 5 (ref 5–15)
BUN: 18 mg/dL (ref 8–23)
CO2: 27 mmol/L (ref 22–32)
Calcium: 9.5 mg/dL (ref 8.9–10.3)
Chloride: 107 mmol/L (ref 98–111)
Creatinine: 0.91 mg/dL (ref 0.44–1.00)
GFR, Estimated: >60 mL/min (ref >60)
Glucose, Bld: 117 mg/dL — ABNORMAL HIGH (ref 70–99)
Potassium: 4.3 mmol/L (ref 3.5–5.1)
Sodium: 139 mmol/L (ref 135–145)
Total Bilirubin: 0.4 mg/dL (ref 0.3–1.2)
Total Protein: 6.6 g/dL (ref 6.5–8.1)

## 2020-08-01 LAB — CBC WITH DIFFERENTIAL (CANCER CENTER ONLY)
Abs Immature Granulocytes: 0.02 10*3/uL (ref 0.00–0.07)
Basophils Absolute: 0 10*3/uL (ref 0.0–0.1)
Basophils Relative: 0 %
Eosinophils Absolute: 0.2 10*3/uL (ref 0.0–0.5)
Eosinophils Relative: 3 %
HCT: 36.8 % (ref 36.0–46.0)
Hemoglobin: 11.3 g/dL — ABNORMAL LOW (ref 12.0–15.0)
Immature Granulocytes: 0 %
Lymphocytes Relative: 21 %
Lymphs Abs: 1.2 10*3/uL (ref 0.7–4.0)
MCH: 29.3 pg (ref 26.0–34.0)
MCHC: 30.7 g/dL (ref 30.0–36.0)
MCV: 95.3 fL (ref 80.0–100.0)
Monocytes Absolute: 0.4 10*3/uL (ref 0.1–1.0)
Monocytes Relative: 7 %
Neutro Abs: 3.8 10*3/uL (ref 1.7–7.7)
Neutrophils Relative %: 69 %
Platelet Count: 302 10*3/uL (ref 150–400)
RBC: 3.86 MIL/uL — ABNORMAL LOW (ref 3.87–5.11)
RDW: 14.2 % (ref 11.5–15.5)
WBC Count: 5.5 10*3/uL (ref 4.0–10.5)
nRBC: 0 % (ref 0.0–0.2)

## 2020-08-01 NOTE — Telephone Encounter (Signed)
Scheduled per 12/13 los, patient would like to come a week after CT scan. Patient received updated calender.

## 2020-08-01 NOTE — Progress Notes (Signed)
[facility] [phone]   Fax:([phone]  OFFICE PROGRESS NOTE  [name], [address] [place] [address]  PRINCIPAL DIAGNOSIS:  metastatic non-small cell lung cancer, adenocarcinoma with brain metastases initially diagnosed as a stage IIb in October 2002.   BIOMARKERS TESTING ([facility]): Positive for: EGFR E746-A750 del, T790M, CTNNB1 D32G, SMAD4 T168fs*10 Negative for: RET, ALK, BRAF, KRAS, ERBB2 and MET  PRIOR THERAPY:  1. Status post left lower lobectomy under the care of Dr Arlyce Dice in October 2002. 2. Status post course of concurrent chemoradiation with weekly carboplatin and paclitaxel under the care of Dr Benay Spice and Dr Elba Barman in early 2003. 3. Status post treatment with Celebrex and Iressa according to the clinical trial at [facility] for a total of 1 year. 4. Status post left occipital craniotomy for tumor resection on September 21, 2005, under the care of Dr Christella Noa. 5. Status post whole brain irradiation under the care of Dr Elba Barman, completed November 07, 2005. 6. Status post gamma knife treatment to the resected cavitary recurrence on June 02, 2008, at [facility]. 7. Status post right thyroidectomy with radical neck dissection under the care of Dr. Constance Holster on 10/18/2011. 8. Status post excisional biopsy of right cervical lymph node that was consistent with metastatic adenocarcinoma. 9. Tarceva 150 mg by mouth daily with therapy beginning 06/03/2007, discontinued in July 2015 secondary to disease progression. Status post 79 cycles. 10. palliative radiotherapy to the enlarging right upper lobe lung nodule under the care of Dr. Valere Dross. 11. Treatment at [facility] on STUDY [id number]  300 mg by mouth daily for 21 days discontinued secondary to study deviation and inability for the patient to keep her appointment at [facility] secondary to recent hip fractures. 12. Open reduction and  internal fixation of trimalleolar ankle fracture dislocation with fixation of the fibula as well as medial malleolus. 13. Tagrisso 80 mg by mouth daily started 11/25/2014, status post 62 months of treatment.  This was discontinued on 02/13/2020 secondary to congestive heart failure with ejection fraction of 20-25%.   CURRENT THERAPY: Tarceva 150 mg p.o. daily.  Started 02/26/2020.  This will be reduced to Tarceva 100 mg p.o. daily starting July 01, 2020.  INTERVAL HISTORY: Valerie [name] 74 y.o. female returns to the clinic today for follow-up visit accompanied by her husband.  The patient is feeling much better and able to do physical therapy in a facility.  She denied having any current chest pain, shortness of breath, cough or hemoptysis.  She has no nausea, vomiting, diarrhea or constipation.  She denied having any significant skin rash or itching.  He has been tolerating her treatment with Tarceva 100 mg p.o. daily fairly well.  She is still complaining of the generalized fatigue and weakness.  She is here today for evaluation and repeat blood work.  MEDICAL HISTORY: Past Medical History:  Diagnosis Date  . Abnormal Pap smear 2006  . Anal fistula   . Ankle fracture   . Anxiety   . Arthritis   . Atrophic vaginitis 2008  . Cataract   . Dementia ([place]) 2009  . Dyspareunia 2008  . H/O osteoporosis   . H/O varicella   . H/O vitamin D deficiency   . Headache 07/24/2016  . Heart murmur   . History of measles, mumps, or rubella   . History of radiation therapy 07/28/13- 08/10/13   right lung metastasis 5000 cGy  10 sessions  . Hypertension   . Lung cancer ([place]) dx'd 2002  . Lung cancer ([place])   . Lung cancer ([place])   . Lung metastasis ([place])    PET scan 05/05/13, RUL lung nodule  . 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 Nodule of right lung CT- 06/03/12   RIGHT UPPER LOBE  . Nodule of right lung 06/03/12   Upper Lobe  . On  antineoplastic chemotherapy    TARCEVA  . Osteoporosis 2010  . Primary cancer of right upper lobe of lung ([place]) 04/22/2009   Qualifier: Diagnosis of  By: Nils Pyle CMA ([place]), Mearl Latin    . Shortness of breath    hx lung ca   . Status post chemotherapy 2003   CARBOPLATIN/PACLITAXEL /STATUS POST CLINICAL TRAIL OF CELEBREX AND IRESSA AT [facility] FOR 1 YEAR  . Status post radiation therapy 2003   LEFT LUNG  . Status post radiation therapy 11/07/2005   WHOLE BRAIN: DR [facility]in [facility]  . Status post radiation therapy 06/02/2008   GAMMA KNIFE OF RESECTED CAVITAY  . Thyroid adenoma    ?  [name] Thyroid cancer ([place]) 10/18/11 bx   adenoid nodules   . Yeast infection     ALLERGIES:  has No Known Allergies.  MEDICATIONS:  Current Outpatient Medications  Medication Sig Dispense Refill  . Ascorbic Acid (VITAMIN C) 100 MG tablet Take 100 mg by mouth every evening. Gummie    . Cholecalciferol (D3 PO) Take 1 capsule by mouth daily.    . clopidogrel (PLAVIX) 75 MG tablet Take 1 tablet (75 mg total) by mouth daily with breakfast. 90 tablet 3  . diphenhydrAMINE (BENADRYL) 25 MG tablet Take 25 mg by mouth every 6 (six) hours as needed for allergies.    [name] donepezil (ARICEPT) 10 MG tablet Take 10 mg by mouth at bedtime.    . erlotinib (TARCEVA) 100 MG tablet Take 1 tablet (100 mg total) by mouth daily. Take on an empty stomach 1 hour before meals or 2 hours after 30 tablet 2  . hydrocerin (EUCERIN) CREA Apply 1 application topically 2 (two) times daily. To back and buttocks. 113 g 0  . loperamide (IMODIUM A-D) 2 MG tablet Take 4 mg by mouth in the morning and at bedtime.    [name] MAGNESIUM PO Take 1 tablet by mouth daily.    . metoprolol succinate (TOPROL-XL) 25 MG 24 hr tablet Take 1 tablet (25 mg total) by mouth daily. 90 tablet 3  . Multiple Vitamin (MULTI-VITAMINS) TABS Take 1 tablet by mouth every evening.     [name] Nystatin (GERHARDT'S BUTT CREAM) CREA Apply 1 application topically 2 (two) times daily. (Patient not taking:  Reported on 07/19/2020) 1 each 1  . nystatin (MYCOSTATIN/NYSTOP) powder Apply topically 2 (two) times daily. To breast folds and groin (Patient not taking: Reported on 07/19/2020) 15 g 0  . potassium chloride SA (KLOR-CON) 20 MEQ tablet Take 20 mEq by mouth daily.    . prochlorperazine (COMPAZINE) 10 MG tablet Take 1 tablet (10 mg total) by mouth every 6 (six) hours as needed for nausea or vomiting. 30 tablet 0  . vitamin B-12 (CYANOCOBALAMIN) 1000 MCG tablet Take 1,000 mcg by mouth every evening.    [name] 20 MG TABS tablet TAKE 1 TABLET BY MOUTH ONCE DAILY IN THE EVENING 90 tablet 0  . zinc oxide (BALMEX) 11.3 % CREA cream Apply 1 application topically as needed (lesions/buttocks).  No current facility-administered medications for this visit.    SURGICAL HISTORY:  Past Surgical History:  Procedure Laterality Date  . BRAIN SURGERY    . INTRAVASCULAR ULTRASOUND/IVUS N/A 02/16/2020   Procedure: Intravascular Ultrasound/IVUS;  Surgeon: Leonie Man, MD;  Location: [facility];  Service: Cardiovascular;  Laterality: N/A;  . LEFT LOWER LOBECTOMY  05/2001  . LEFT OCCIPITAL CRANIOTOMY  09/21/2005   tumor  . LUNG LOBECTOMY    . NECK SURGERY    . ORIF ANKLE FRACTURE Left 10/02/2014   Procedure: OPEN REDUCTION INTERNAL FIXATION (ORIF) ANKLE FRACTURE;  Surgeon: Alta Corning, MD;  Location: [facility];  Service: Orthopedics;  Laterality: Left;  . ORIF FEMUR FRACTURE Right 06/11/2019   Procedure: OPEN REDUCTION INTERNAL FIXATION (ORIF) DISTAL FEMUR FRACTURE;  Surgeon: Renette [place]ters, MD;  Location: [facility];  Service: Orthopedics;  Laterality: Right;  . ORIF TIBIA PLATEAU Right 06/11/2019   Procedure: Open Reduction Internal Fixation (Orif) Tibial Plateau;  Surgeon: Renette [place]ters, MD;  Location: [facility];  Service: Orthopedics;  Laterality: Right;  . RADICAL NECK DISSECTION  10/18/2011   Procedure: RADICAL NECK DISSECTION;  Surgeon: Izora Gala, MD;  Location: [facility];  Service: ENT;   Laterality: Right;  RIGHT MODIFIED NECK DISSECTION /POSSIBLE RIGHT THYROIDECTOM  . RIGHT/LEFT HEART CATH AND CORONARY ANGIOGRAPHY N/A 02/16/2020   Procedure: RIGHT/LEFT HEART CATH AND CORONARY ANGIOGRAPHY;  Surgeon: Leonie Man, MD;  Location: [facility];  Service: Cardiovascular;  Laterality: N/A;  . TEE WITHOUT CARDIOVERSION N/A 03/28/2020   Procedure: TRANSESOPHAGEAL ECHOCARDIOGRAM (TEE);  Surgeon: Buford Dresser, MD;  Location[facility];  Service: Cardiovascular;  Laterality: N/A;  . THYROIDECTOMY  10/18/2011   Procedure: THYROIDECTOMY WITH RADICAL NECK DISSECTION;  Surgeon: Izora Gala, MD;  Location: [facility];  Service: ENT;  Laterality: Right;    REVIEW OF SYSTEMS:  A comprehensive review of systems was negative except for: Constitutional: positive for fatigue Musculoskeletal: positive for muscle weakness   PHYSICAL EXAMINATION: General appearance: alert, cooperative, fatigued and no distress Head: Normocephalic, without obvious abnormality, atraumatic Neck: no adenopathy, supple, symmetrical, trachea midline and thyroid not enlarged, symmetric, no tenderness/mass/nodules Lymph nodes: Cervical, supraclavicular, and axillary nodes normal. Resp: clear to auscultation bilaterally Back: symmetric, no curvature. ROM normal. No CVA tenderness. Cardio: regular rate and rhythm, S1, S2 normal, no murmur, click, rub or gallop GI: soft, non-tender; bowel sounds normal; no masses,  no organomegaly Extremities: extremities normal, atraumatic, no cyanosis or edema  ECOG PERFORMANCE STATUS: 1 - Symptomatic but completely ambulatory  Blood pressure 106/69, pulse 80, temperature (!) 97.5 F (36.4 C), temperature source Tympanic, resp. rate 18.  LABORATORY DATA: Lab Results  Component Value Date   WBC 5.5 08/01/2020   HGB 11.3 (L) 08/01/2020   HCT 36.8 08/01/2020   MCV 95.3 08/01/2020   PLT 302 08/01/2020      Chemistry      Component Value Date/Time   NA 137 06/28/2020  1458   NA 140 04/20/2020 0000   NA 141 08/06/2017 1047   K 4.4 06/28/2020 1458   K 3.9 08/06/2017 1047   CL 106 06/28/2020 1458   CL 106 12/09/2012 0806   CO2 25 06/28/2020 1458   CO2 24 08/06/2017 1047   BUN 14 06/28/2020 1458   BUN 18 04/20/2020 0000   BUN 15.4 08/06/2017 1047   CREATININE 0.74 06/28/2020 1458   CREATININE 1.0 08/06/2017 1047   GLU 89 04/20/2020 0000      Component  Value Date/Time   CALCIUM 8.9 06/28/2020 1458   CALCIUM 9.8 08/06/2017 1047   ALKPHOS 96 06/28/2020 1458   ALKPHOS 65 08/06/2017 1047   AST 13 (L) 06/28/2020 1458   AST 11 08/06/2017 1047   ALT 16 06/28/2020 1458   ALT 9 08/06/2017 1047   BILITOT 0.4 06/28/2020 1458   BILITOT 0.66 08/06/2017 1047       RADIOGRAPHIC STUDIES: No results found.  ASSESSMENT AND PLAN: This is a very pleasant 74 years old African-American female with metastatic non-small cell lung cancer, adenocarcinoma with positive EGFR mutation status post several years of treatment with Tarceva and the patient developed positive resistant mutation, T790M. She has been on treatment with Tagrisso for the last 5 years, status post 62 months of treatment.  She has been tolerating her treatment with Tagrisso well but she was recently admitted to [facility] with shortness of breath and she was diagnosed with congestive heart failure secondary to cardiomyopathy with ejection fraction of 20-25%.  This is likely secondary to her treatment with Tagrisso. I discontinued her treatment with Tagrisso on 02/13/2020. The patient is currently on treatment with Tarceva 150 mg p.o. daily started 4 months ago with some interruption because of her hospitalization. The patient resumed her treatment with Tarceva at a dose of 100 mg p.o. daily status post 1 months of treatment.  She is tolerating the current dose fairly well with no significant adverse effects. I recommended for her to continue her current treatment with Tarceva with the same  dose. I will see her back for follow-up visit in 1 months for evaluation with repeat CT scan of the chest, abdomen pelvis for restaging of her disease. For the anemia of chronic disease, she will continue her current treatment with oral iron tablet. For the weakness, she will continue with the physical therapy and exercise. The patient was advised to call immediately if she has any concerning symptoms in the interval. The patient voices understanding of current disease status and treatment options and is in agreement with the current care plan. All questions were answered. The patient knows to call the clinic with any problems, questions or concerns. We can certainly see the patient much sooner if necessary.  Disclaimer: This note was dictated with voice recognition software. Similar sounding words can inadvertently be transcribed and may not be corrected upon review.

## 2020-08-03 ENCOUNTER — Ambulatory Visit: Payer: Self-pay

## 2020-08-03 ENCOUNTER — Other Ambulatory Visit: Payer: Self-pay

## 2020-08-03 ENCOUNTER — Telehealth: Payer: Medicare PPO

## 2020-08-03 DIAGNOSIS — D62 Acute posthemorrhagic anemia: Secondary | ICD-10-CM | POA: Diagnosis not present

## 2020-08-03 DIAGNOSIS — I5041 Acute combined systolic (congestive) and diastolic (congestive) heart failure: Secondary | ICD-10-CM | POA: Diagnosis not present

## 2020-08-03 DIAGNOSIS — I420 Dilated cardiomyopathy: Secondary | ICD-10-CM | POA: Diagnosis not present

## 2020-08-03 DIAGNOSIS — I2510 Atherosclerotic heart disease of native coronary artery without angina pectoris: Secondary | ICD-10-CM | POA: Diagnosis not present

## 2020-08-03 DIAGNOSIS — C3490 Malignant neoplasm of unspecified part of unspecified bronchus or lung: Secondary | ICD-10-CM | POA: Diagnosis not present

## 2020-08-03 DIAGNOSIS — C3411 Malignant neoplasm of upper lobe, right bronchus or lung: Secondary | ICD-10-CM

## 2020-08-03 DIAGNOSIS — R269 Unspecified abnormalities of gait and mobility: Secondary | ICD-10-CM

## 2020-08-03 DIAGNOSIS — I509 Heart failure, unspecified: Secondary | ICD-10-CM | POA: Diagnosis not present

## 2020-08-03 DIAGNOSIS — R5381 Other malaise: Secondary | ICD-10-CM

## 2020-08-03 DIAGNOSIS — C719 Malignant neoplasm of brain, unspecified: Secondary | ICD-10-CM | POA: Diagnosis not present

## 2020-08-03 DIAGNOSIS — F0390 Unspecified dementia without behavioral disturbance: Secondary | ICD-10-CM

## 2020-08-04 DIAGNOSIS — S72401D Unspecified fracture of lower end of right femur, subsequent encounter for closed fracture with routine healing: Secondary | ICD-10-CM | POA: Diagnosis not present

## 2020-08-04 DIAGNOSIS — C719 Malignant neoplasm of brain, unspecified: Secondary | ICD-10-CM | POA: Diagnosis not present

## 2020-08-04 DIAGNOSIS — J45909 Unspecified asthma, uncomplicated: Secondary | ICD-10-CM | POA: Diagnosis not present

## 2020-08-04 DIAGNOSIS — M810 Age-related osteoporosis without current pathological fracture: Secondary | ICD-10-CM | POA: Diagnosis not present

## 2020-08-04 DIAGNOSIS — F0390 Unspecified dementia without behavioral disturbance: Secondary | ICD-10-CM | POA: Diagnosis not present

## 2020-08-04 DIAGNOSIS — I420 Dilated cardiomyopathy: Secondary | ICD-10-CM | POA: Diagnosis not present

## 2020-08-04 DIAGNOSIS — C3411 Malignant neoplasm of upper lobe, right bronchus or lung: Secondary | ICD-10-CM | POA: Diagnosis not present

## 2020-08-04 DIAGNOSIS — I5040 Unspecified combined systolic (congestive) and diastolic (congestive) heart failure: Secondary | ICD-10-CM | POA: Diagnosis not present

## 2020-08-04 DIAGNOSIS — I2510 Atherosclerotic heart disease of native coronary artery without angina pectoris: Secondary | ICD-10-CM | POA: Diagnosis not present

## 2020-08-05 DIAGNOSIS — F0390 Unspecified dementia without behavioral disturbance: Secondary | ICD-10-CM | POA: Diagnosis not present

## 2020-08-05 DIAGNOSIS — N390 Urinary tract infection, site not specified: Secondary | ICD-10-CM | POA: Diagnosis not present

## 2020-08-05 DIAGNOSIS — J45909 Unspecified asthma, uncomplicated: Secondary | ICD-10-CM | POA: Diagnosis not present

## 2020-08-05 DIAGNOSIS — C719 Malignant neoplasm of brain, unspecified: Secondary | ICD-10-CM | POA: Diagnosis not present

## 2020-08-05 DIAGNOSIS — C3411 Malignant neoplasm of upper lobe, right bronchus or lung: Secondary | ICD-10-CM | POA: Diagnosis not present

## 2020-08-05 DIAGNOSIS — M810 Age-related osteoporosis without current pathological fracture: Secondary | ICD-10-CM | POA: Diagnosis not present

## 2020-08-05 DIAGNOSIS — S72401D Unspecified fracture of lower end of right femur, subsequent encounter for closed fracture with routine healing: Secondary | ICD-10-CM | POA: Diagnosis not present

## 2020-08-05 DIAGNOSIS — I420 Dilated cardiomyopathy: Secondary | ICD-10-CM | POA: Diagnosis not present

## 2020-08-05 DIAGNOSIS — I2510 Atherosclerotic heart disease of native coronary artery without angina pectoris: Secondary | ICD-10-CM | POA: Diagnosis not present

## 2020-08-05 DIAGNOSIS — I5040 Unspecified combined systolic (congestive) and diastolic (congestive) heart failure: Secondary | ICD-10-CM | POA: Diagnosis not present

## 2020-08-09 NOTE — Chronic Care Management (AMB) (Signed)
Chronic Care Management   Follow Up Note   08/03/2020 Name: Valerie [name] MRN: [id number] DOB: [birth year]  Referred by: [name], MD Reason for referral : Chronic Care Management (FU RN CM Call )   Valerie [name] is a 74 y.o. year old female who is a primary care patient of [name], MD. The CCM team was consulted for assistance with chronic disease management and care coordination needs.    Review of patient status, including review of consultants reports, relevant laboratory and other test results, and collaboration with appropriate care team members and the patient's provider was performed as part of comprehensive patient evaluation and provision of chronic care management services.    SDOH (Social Determinants of Health) assessments performed: Yes - no acute challenges identified See Care Plan activities for detailed interventions related to [place])   Completed outbound CCM RN CM follow up call to husband [name] for a care plan update.    Outpatient Encounter Medications as of 08/03/2020  Medication Sig Note  . Ascorbic Acid (VITAMIN C) 100 MG tablet Take 100 mg by mouth every evening. Gummie   . Cholecalciferol (D3 PO) Take 1 capsule by mouth daily.   . clopidogrel (PLAVIX) 75 MG tablet Take 1 tablet (75 mg total) by mouth daily with breakfast.   . diphenhydrAMINE (BENADRYL) 25 MG tablet Take 25 mg by mouth every 6 (six) hours as needed for allergies.   [name] donepezil (ARICEPT) 10 MG tablet Take 10 mg by mouth at bedtime.   . erlotinib (TARCEVA) 100 MG tablet Take 1 tablet (100 mg total) by mouth daily. Take on an empty stomach 1 hour before meals or 2 hours after   . hydrocerin (EUCERIN) CREA Apply 1 application topically 2 (two) times daily. To back and buttocks.   . loperamide (IMODIUM A-D) 2 MG tablet Take 4 mg by mouth in the morning and at bedtime.   [name] MAGNESIUM PO Take 1 tablet by mouth daily.   . metoprolol succinate (TOPROL-XL) 25 MG 24 hr tablet  Take 1 tablet (25 mg total) by mouth daily.   . Multiple Vitamin (MULTI-VITAMINS) TABS Take 1 tablet by mouth every evening.    [name] Nystatin (GERHARDT'S BUTT CREAM) CREA Apply 1 application topically 2 (two) times daily. (Patient not taking: Reported on 07/19/2020)   . nystatin (MYCOSTATIN/NYSTOP) powder Apply topically 2 (two) times daily. To breast folds and groin (Patient not taking: Reported on 07/19/2020)   . potassium chloride SA (KLOR-CON) 20 MEQ tablet Take 20 mEq by mouth daily. 06/03/2020: Non-compliance  . prochlorperazine (COMPAZINE) 10 MG tablet Take 1 tablet (10 mg total) by mouth every 6 (six) hours as needed for nausea or vomiting.   . vitamin B-12 (CYANOCOBALAMIN) 1000 MCG tablet Take 1,000 mcg by mouth every evening.   [name] 20 MG TABS tablet TAKE 1 TABLET BY MOUTH ONCE DAILY IN THE EVENING   . zinc oxide (BALMEX) 11.3 % CREA cream Apply 1 application topically as needed (lesions/buttocks).    No facility-administered encounter medications on file as of 08/03/2020.     Objective:  Lab Results  Component Value Date   HGBA1C 5.1 10/16/2018   Lab Results  Component Value Date   MICROALBUR 30 05/25/2019   LDLCALC 97 04/20/2020   CREATININE 0.91 08/01/2020   BP Readings from Last 3 Encounters:  08/01/20 106/69  07/19/20 114/66  06/28/20 119/79    Goals Addressed    . "to get her stronger"   On track  Husband stated CARE PLAN ENTRY (see longitudinal plan of care for additional care plan information)  Current Barriers:  . Chronic Disease Management support and education needs related to Malignant Neoplasm of upper lobe of right lung; Dementia w/o behavior disturbance, unspecified dementia type, Gait abnormality, Debility . Cognitive Deficits  Nurse Case Manager Clinical Goal(s):  [name] Over the next 90 days, patient will work with the CCM team and PCP to address needs related to disease education and support to improve Self Health management of chronic health  conditions  CCM RN CM Interventions:  08/03/20 call completed with husband [name] . Inter-disciplinary care team collaboration (see longitudinal plan of care) . Evaluation of current treatment plan related to Impaired Physical Mobility and Self Care deficit and patient's adherence to plan as established by provider . Determined patient has all DME in the home needed at this time . Determined patient continues to receive Remote Health services . Determined Mrs. [place] will start outpatient PT in January to work on balance and strengthening  . Determined patient will need a SNV to obtain urine sample per Dr. Baird Cancer . Collaborated with Dr. Baird Cancer concerning the need for a repeat UA, remote health is unable to obtain, orders sent to [facility] per Dr. Baird Cancer for [facility], husband aware . Discussed plans with patient for ongoing care management follow up and provided husband with direct contact information for care management team  Patient Self Care Activities:  Over the next 90 days, patient/spouse will: . Attend all scheduled provider appointments . Call provider office for new concerns or questions . Continue to take prescribed medications as directed . Provide urine specimen for recheck of UTI . Follow up with [facility] outpatient PT as scheduled on 08/23/20 to work on endurance and strengthening   Please see past updates related to this goal by clicking on the "Past Updates" button in the selected goal        Plan:   Telephone follow up appointment with care management team member scheduled for: 09/13/20  Barb Merino, RN, BSN, CCM Care Management Coordinator [facility]  Direct Phone: [phone]

## 2020-08-09 NOTE — Patient Instructions (Signed)
Visit Information  Goals Addressed    . "to get her stronger"   On track    Husband stated [place] (see longitudinal plan of care for additional care plan information)  Current Barriers:  . Chronic Disease Management support and education needs related to Malignant Neoplasm of upper lobe of right lung; Dementia w/o behavior disturbance, unspecified dementia type, Gait abnormality, Debility . Cognitive Deficits  Nurse Case Manager Clinical Goal(s):  [name] Over the next 90 days, patient will work with the CCM team and PCP to address needs related to disease education and support to improve Self Health management of chronic health conditions  CCM RN CM Interventions:  08/03/20 call completed with husband [name] . Inter-disciplinary care team collaboration (see longitudinal plan of care) . Evaluation of current treatment plan related to Impaired Physical Mobility and Self Care deficit and patient's adherence to plan as established by provider . Determined patient has all DME in the home needed at this time . Determined patient continues to receive Remote Health services . Determined Mrs. Lafever will start outpatient PT in January to work on balance and strengthening  . Determined patient will need a SNV to obtain urine sample per Dr. Baird Cancer . Collaborated with Dr. Baird Cancer concerning the need for a repeat UA, remote health is unable to obtain, orders sent to [facility] per Dr. Baird Cancer for [facility], husband aware . Discussed plans with patient for ongoing care management follow up and provided husband with direct contact information for care management team  Patient Self Care Activities:  Over the next 90 days, patient/spouse will: . Attend all scheduled provider appointments . Call provider office for new concerns or questions . Continue to take prescribed medications as directed . Provide urine specimen for recheck of UTI . Follow up with [facility] outpatient PT as scheduled on 08/23/20 to work  on endurance and strengthening   Please see past updates related to this goal by clicking on the "Past Updates" button in the selected goal        The patient verbalized understanding of instructions, educational materials, and care plan provided today and declined offer to receive copy of patient instructions, educational materials, and care plan.   Telephone follow up appointment with care management team member scheduled for: 09/13/20  Lynne Logan, RN

## 2020-08-10 DIAGNOSIS — C719 Malignant neoplasm of brain, unspecified: Secondary | ICD-10-CM | POA: Diagnosis not present

## 2020-08-10 DIAGNOSIS — M810 Age-related osteoporosis without current pathological fracture: Secondary | ICD-10-CM | POA: Diagnosis not present

## 2020-08-10 DIAGNOSIS — S72401D Unspecified fracture of lower end of right femur, subsequent encounter for closed fracture with routine healing: Secondary | ICD-10-CM | POA: Diagnosis not present

## 2020-08-10 DIAGNOSIS — C3411 Malignant neoplasm of upper lobe, right bronchus or lung: Secondary | ICD-10-CM | POA: Diagnosis not present

## 2020-08-10 DIAGNOSIS — J45909 Unspecified asthma, uncomplicated: Secondary | ICD-10-CM | POA: Diagnosis not present

## 2020-08-10 DIAGNOSIS — I5040 Unspecified combined systolic (congestive) and diastolic (congestive) heart failure: Secondary | ICD-10-CM | POA: Diagnosis not present

## 2020-08-10 DIAGNOSIS — F0390 Unspecified dementia without behavioral disturbance: Secondary | ICD-10-CM | POA: Diagnosis not present

## 2020-08-10 DIAGNOSIS — I2510 Atherosclerotic heart disease of native coronary artery without angina pectoris: Secondary | ICD-10-CM | POA: Diagnosis not present

## 2020-08-10 DIAGNOSIS — I420 Dilated cardiomyopathy: Secondary | ICD-10-CM | POA: Diagnosis not present

## 2020-08-11 DIAGNOSIS — I420 Dilated cardiomyopathy: Secondary | ICD-10-CM | POA: Diagnosis not present

## 2020-08-11 DIAGNOSIS — C3411 Malignant neoplasm of upper lobe, right bronchus or lung: Secondary | ICD-10-CM | POA: Diagnosis not present

## 2020-08-11 DIAGNOSIS — I2510 Atherosclerotic heart disease of native coronary artery without angina pectoris: Secondary | ICD-10-CM | POA: Diagnosis not present

## 2020-08-11 DIAGNOSIS — J45909 Unspecified asthma, uncomplicated: Secondary | ICD-10-CM | POA: Diagnosis not present

## 2020-08-11 DIAGNOSIS — F0390 Unspecified dementia without behavioral disturbance: Secondary | ICD-10-CM | POA: Diagnosis not present

## 2020-08-11 DIAGNOSIS — I5040 Unspecified combined systolic (congestive) and diastolic (congestive) heart failure: Secondary | ICD-10-CM | POA: Diagnosis not present

## 2020-08-11 DIAGNOSIS — C719 Malignant neoplasm of brain, unspecified: Secondary | ICD-10-CM | POA: Diagnosis not present

## 2020-08-11 DIAGNOSIS — M810 Age-related osteoporosis without current pathological fracture: Secondary | ICD-10-CM | POA: Diagnosis not present

## 2020-08-11 DIAGNOSIS — S72401D Unspecified fracture of lower end of right femur, subsequent encounter for closed fracture with routine healing: Secondary | ICD-10-CM | POA: Diagnosis not present

## 2020-08-16 DIAGNOSIS — S72401A Unspecified fracture of lower end of right femur, initial encounter for closed fracture: Secondary | ICD-10-CM | POA: Diagnosis not present

## 2020-08-17 DIAGNOSIS — C3411 Malignant neoplasm of upper lobe, right bronchus or lung: Secondary | ICD-10-CM | POA: Diagnosis not present

## 2020-08-17 DIAGNOSIS — C719 Malignant neoplasm of brain, unspecified: Secondary | ICD-10-CM | POA: Diagnosis not present

## 2020-08-17 DIAGNOSIS — I5040 Unspecified combined systolic (congestive) and diastolic (congestive) heart failure: Secondary | ICD-10-CM | POA: Diagnosis not present

## 2020-08-17 DIAGNOSIS — I420 Dilated cardiomyopathy: Secondary | ICD-10-CM | POA: Diagnosis not present

## 2020-08-17 DIAGNOSIS — M810 Age-related osteoporosis without current pathological fracture: Secondary | ICD-10-CM | POA: Diagnosis not present

## 2020-08-17 DIAGNOSIS — I2510 Atherosclerotic heart disease of native coronary artery without angina pectoris: Secondary | ICD-10-CM | POA: Diagnosis not present

## 2020-08-17 DIAGNOSIS — F0390 Unspecified dementia without behavioral disturbance: Secondary | ICD-10-CM | POA: Diagnosis not present

## 2020-08-17 DIAGNOSIS — S72401D Unspecified fracture of lower end of right femur, subsequent encounter for closed fracture with routine healing: Secondary | ICD-10-CM | POA: Diagnosis not present

## 2020-08-17 DIAGNOSIS — J45909 Unspecified asthma, uncomplicated: Secondary | ICD-10-CM | POA: Diagnosis not present

## 2020-08-22 DIAGNOSIS — R54 Age-related physical debility: Secondary | ICD-10-CM | POA: Diagnosis not present

## 2020-08-23 ENCOUNTER — Other Ambulatory Visit: Payer: Self-pay

## 2020-08-23 ENCOUNTER — Ambulatory Visit: Payer: Medicare PPO | Attending: Internal Medicine | Admitting: Physical Therapy

## 2020-08-23 DIAGNOSIS — R2689 Other abnormalities of gait and mobility: Secondary | ICD-10-CM | POA: Insufficient documentation

## 2020-08-23 DIAGNOSIS — M6281 Muscle weakness (generalized): Secondary | ICD-10-CM | POA: Diagnosis not present

## 2020-08-23 DIAGNOSIS — R293 Abnormal posture: Secondary | ICD-10-CM | POA: Diagnosis not present

## 2020-08-23 NOTE — Patient Instructions (Signed)
   Valerie [name], PT, [place] Certified Exercise Expert for the Aging Adult  08/23/20 2:12 PM Phone: [phone] Fax: [phone]

## 2020-08-23 NOTE — Therapy (Signed)
[place], [place], [address] Phone: [phone]   Fax:  [phone]  Physical Therapy Evaluation  Patient Details  Name: Valerie [name] MRN: [id number] Date of Birth: September 18, 1945 Referring Provider (PT): [name] MD   Encounter Date: 08/23/2020   PT End of Session - 08/23/20 1306    Visit Number 1    Number of Visits 24    Date for PT Re-Evaluation 11/15/20    Authorization Type [employer] MCR  10 VISIT PROGRESS NOTE; KX AT 15    PT Start Time 1310    PT Stop Time 1405    PT Time Calculation (min) 55 min    Activity Tolerance Patient tolerated treatment well    Behavior During Therapy [facility] for tasks assessed/performed           Past Medical History:  Diagnosis Date  . Abnormal Pap smear 2006  . Anal fistula   . Ankle fracture   . Anxiety   . Arthritis   . Atrophic vaginitis 2008  . Cataract   . Dementia ([place]) 2009  . Dyspareunia 2008  . H/O osteoporosis   . H/O varicella   . H/O vitamin D deficiency   . Headache 07/24/2016  . Heart murmur   . History of measles, mumps, or rubella   . History of radiation therapy 07/28/13- 08/10/13   right lung metastasis 5000 cGy 10 sessions  . Hypertension   . Lung cancer ([place]) dx'd 2002  . Lung cancer ([place])   . Lung cancer ([place])   . Lung metastasis ([place])    PET scan 05/05/13, RUL lung nodule  . 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 Nodule of right lung CT- 06/03/12   RIGHT UPPER LOBE  . Nodule of right lung 06/03/12   Upper Lobe  . On antineoplastic chemotherapy    TARCEVA  . Osteoporosis 2010  . Primary cancer of right upper lobe of lung ([place]) 04/22/2009   Qualifier: Diagnosis of  By: Nils Pyle CMA ([place]), Mearl Latin    . Shortness of breath    hx lung ca   . Status post chemotherapy 2003   CARBOPLATIN/PACLITAXEL /STATUS POST CLINICAL TRAIL OF CELEBREX AND IRESSA AT [facility] FOR 1 YEAR  . Status post  radiation therapy 2003   LEFT LUNG  . Status post radiation therapy 11/07/2005   WHOLE BRAIN: DR Larkin [facility]  . Status post radiation therapy 06/02/2008   GAMMA KNIFE OF RESECTED CAVITAY  . Thyroid adenoma    ?  [name] Thyroid cancer ([place]) 10/18/11 bx   adenoid nodules   . Yeast infection     Past Surgical History:  Procedure Laterality Date  . BRAIN SURGERY    . INTRAVASCULAR ULTRASOUND/IVUS N/A 02/16/2020   Procedure: Intravascular Ultrasound/IVUS;  Surgeon: Leonie Man, MD;  Location: [place] [facility];  Service: Cardiovascular;  Laterality: N/A;  . LEFT LOWER LOBECTOMY  05/2001  . LEFT OCCIPITAL CRANIOTOMY  09/21/2005   tumor  . LUNG LOBECTOMY    . NECK SURGERY    . ORIF ANKLE FRACTURE Left 10/02/2014   Procedure: OPEN REDUCTION INTERNAL FIXATION (ORIF) ANKLE FRACTURE;  Surgeon: Alta Corning, MD;  Location: [facility];  Service: Orthopedics;  Laterality: Left;  . ORIF FEMUR FRACTURE Right 06/11/2019   Procedure: OPEN REDUCTION INTERNAL FIXATION (ORIF) DISTAL FEMUR FRACTURE;  Surgeon: Renette Butters, MD;  Location: [place];  Service: Orthopedics;  Laterality: Right;  . ORIF TIBIA PLATEAU Right 06/11/2019   Procedure: Open Reduction Internal Fixation (Orif) Tibial Plateau;  Surgeon: Renette Butters, MD;  Location: [place];  Service: Orthopedics;  Laterality: Right;  . RADICAL NECK DISSECTION  10/18/2011   Procedure: RADICAL NECK DISSECTION;  Surgeon: Izora Gala, MD;  Location: [place];  Service: ENT;  Laterality: Right;  RIGHT MODIFIED NECK DISSECTION /POSSIBLE RIGHT THYROIDECTOM  . RIGHT/LEFT HEART CATH AND CORONARY ANGIOGRAPHY N/A 02/16/2020   Procedure: RIGHT/LEFT HEART CATH AND CORONARY ANGIOGRAPHY;  Surgeon: Leonie Man, MD;  Location: [place] [facility];  Service: Cardiovascular;  Laterality: N/A;  . TEE WITHOUT CARDIOVERSION N/A 03/28/2020   Procedure: TRANSESOPHAGEAL ECHOCARDIOGRAM (TEE);  Surgeon: Buford Dresser, MD;  Location: [place] [facility];  Service: Cardiovascular;   Laterality: N/A;  . THYROIDECTOMY  10/18/2011   Procedure: THYROIDECTOMY WITH RADICAL NECK DISSECTION;  Surgeon: Izora Gala, MD;  Location: [place];  Service: ENT;  Laterality: Right;    There were no vitals filed for this visit.    Subjective Assessment - 08/23/20 1311    Subjective Pt and husband report pt was admitted in the hosptial for about 10 days(06-03-20 to 06-13-20  and recieved PT in hospital.  After DC she went to [facility].  Pt stated she was almost walking there for 20 days.  Valerie [name] did have HHPT after rehab about 10 visits and now we are here for evaluation.  Pt had cellulitis and sepsis in hospital.  she was making good gains at rehab according to husband but HHPT was sporadic due to the holidays    Patient is accompained by: Family member   husband   Pertinent History History of lung cancer for 18 years. ORIF Rt tibial plateau 06-11-19, recent hospitalization due to cellulitis and sepsis  See medical hx    How long can you sit comfortably? can sit in a wheelchar without difficulty    How long can you stand comfortably? can not stand.    How long can you walk comfortably? can not walk    Diagnostic tests recent hospitalization    Patient Stated Goals to be able to walk to the bathroom, to be able to transfer , to be able to stand on my own    Currently in Pain? Yes    Pain Score 3     Pain Location Generalized    Pain Orientation Right;Left    Pain Type Other (Comment)   stiffness due to being bedridden   Pain Onset More than a month ago              [facility] PT Assessment - 08/23/20 0001      Assessment   Medical Diagnosis gait abnormality, Tibial palteua fracture/ severe debility    Referring Provider (PT) [name] MD    Hand Dominance Right    Next MD Visit Nothing scheuled     Prior Therapy in hospital to rehab to [place] and now OPPT      Precautions   Precautions Fall    Precaution Comments no falls since hospitalization this time but HIGH  risk of falls      Restrictions   Weight Bearing Restrictions No      Balance Screen   Has the patient fallen in the past 6 months No    Has the patient had a decrease in activity level because of a fear of falling?  No    Is the patient reluctant to leave their  home because of a fear of falling?  No      Home Environment   Additional Comments ramp;  walk in shower  hoyer lift      Prior Function   Level of Independence Needs assistance with transfers;Needs assistance with gait;Needs assistance with homemaking;Needs assistance with ADLs      Cognition   Overall Cognitive Status Within Functional Limits for tasks assessed    Attention Focused    Focused Attention Appears intact    Memory Appears intact    Awareness Appears intact    Problem Solving Appears intact      Observation/Other Assessments   Focus on Therapeutic Outcomes (FOTO)  Not appropraite       Sensation   Light Touch Appears Intact      Posture/Postural Control   Posture Comments severe kyphosis of the spine sits with with slouched posture; right knee valgus; forward head , unable to lie flat without pillows supporting      ROM / Strength   AROM / PROM / Strength AROM;PROM;Strength      AROM   Overall AROM Comments mild limitations in right hip , spinal kyphosis limiting spinal extension    Right Ankle Dorsiflexion 0    Left Ankle Dorsiflexion -2      PROM   Overall PROM Comments not tested due to time restraints      Strength   Strength Assessment Site Knee;Hip    Right/Left Hip Right;Left    Right Hip Flexion 3+/5    Right Hip Extension 2/5   tested in supine   Right Hip Internal Rotation 3+/5    Right Hip ABduction 3+/5    Left Hip Flexion 3+/5    Left Hip Extension 2/5   tested in supine unable to lift hips for bridging   Left Hip ABduction 3+/5    Left Hip ADduction 3+/5    Right Knee Flexion 3+/5    Right Knee Extension 4-/5    Left Knee Flexion 3+/5    Left Knee Extension 4-/5       Palpation   Palpation comment No tenderness to palpation       Transfers   Transfers Sit to Stand    Sit to Stand 2: Max assist    Sit to Stand Details (indicate cue type and reason) 4 x sit to stand first 35 sec , then each 15 sec x 2 and then 10 sec    Transfer via Lift Equipment Other/comments   II bars    Transfer Cueing torso forward but difficult due to kyphotic posture/stiff spine    Comments transfer to mat (uses a sliding board) at home and required mod A to max assist as pt fatigues.      Ambulation/Gait   Gait Comments unable to ambulate                      Objective measurements completed on examination: See above findings.               PT Education - 08/23/20 1756    Education Details POC,  expected level of progress, initial HEP    Person(s) Educated Patient    Methods Explanation;Demonstration;Tactile cues;Verbal cues;Handout    Comprehension Verbalized understanding;Returned demonstration            PT Short Term Goals - 08/23/20 1428      PT SHORT TERM GOAL #1   Title Patient will transfer sit to stand with  min a    Baseline max A on eval    Time 6    Period Weeks    Status New    Target Date 10/04/20      PT SHORT TERM GOAL #2   Title Patient will stand for 60 seconds without significant fatigue    Baseline Pt attempted 35 sec then 15 sec then 15 sec fatigues easily    Time 6    Period Weeks    Status New    Target Date 10/04/20      PT SHORT TERM GOAL #3   Title Patient will use walker to take a step    Baseline Pt unable to take a step at eval    Time 6    Period Weeks    Status New    Target Date 10/04/20      PT SHORT TERM GOAL #4   Title Pt will use sliding board and min A from W/C to mat    Baseline Pt requires mod to Max A due to fatigue and requires extra time to complete movement    Time 6    Period Weeks    Status New    Target Date 10/04/20             PT Long Term Goals - 08/23/20 1753      PT  LONG TERM GOAL #1   Title Patient will ambulate 21' with mod A and AD    Time 12    Period Weeks    Status New    Target Date 11/15/20      PT LONG TERM GOAL #2   Title Patient will trnasfer from her wheelchair to a regular chair with min gaurd with walker    Time 12    Period Weeks    Status New    Target Date 11/15/20      PT LONG TERM GOAL #3   Title Patient will stand for 5 minutes in order to perfrom ADL's at the sink    Baseline Eval 35 sec , then 15 sec fatigues easily    Time 12    Status New    Target Date 11/15/20      PT LONG TERM GOAL #4   Title Pt will be able to perform full range bridge in order to perform bed mobility with 50% greater ease    Time 12    Period Weeks    Status New    Target Date 11/15/20                  Plan - 08/23/20 1410    Clinical Impression Statement Valerie [name] is 75 yo female with s/p R tibial plateau fx march 2020.  She had recent hospitialization for cellulitis/sepsis and had been bedridden for 10 days followed by rehab faciliity and then HHPT . She would like to continue OPPT in order to become stronger for transfers and for ease of toileting/hygiene.  She would also like to be able to take some steps with an assistive device.  Her weakness is significant and she also has severe kyphotic posture.  She is only able to stand 4 x ( 35 sec , 15 sec, 15 sec and 5 sec)  She needs mod to max A for transfers due to becoming fatigues easily.  she has a sliding board and would benefit from using for strengthening  and safety. Pt uses a hoyer at home and W/C for most  of day. she does sleep in her bed at home with multiple pillows. Pt will benefit from skilled PT to maximize functional transfers and maximize strength.    Personal Factors and Comorbidities Comorbidity 1;Comorbidity 2;Comorbidity 3+    Comorbidities History of lung cancer for 18 years. ORIF Rt tibial plateau 06-11-19, recent hospitalization due to cellulitis and sepsis  See medical  hx    Examination-Activity Limitations Bed Mobility;Bathing;Bend;Carry;Lift;Dressing;Locomotion Level;Squat;Stairs;Stand;Toileting;Transfers    Examination-Participation Restrictions Church;Community Activity;Cleaning;Laundry;Medication Management    Stability/Clinical Decision Making Unstable/Unpredictable    Clinical Decision Making High    Rehab Potential Fair    PT Frequency 2x / week    PT Duration 12 weeks    PT Treatment/Interventions ADLs/Self Care Home Management;Cryotherapy;Gait training;Stair training;Functional mobility training;Therapeutic activities;Therapeutic exercise;Neuromuscular re-education;Patient/family education;Manual techniques    PT Next Visit Plan Pt needs to work on transfers with sliding board for functional strength.  review HEP with resistance (yellow to challenge in sitting)    PT Home Exercise Plan [email or web address]    Consulted and Agree with Plan of Care Patient           Patient will benefit from skilled therapeutic intervention in order to improve the following deficits and impairments:  Abnormal gait,Decreased activity tolerance,Decreased endurance,Difficulty walking,Decreased mobility,Decreased strength,Pain,Postural dysfunction  Visit Diagnosis: Other abnormalities of gait and mobility  Muscle weakness (generalized)  Abnormal posture   Access Code: [email or web address]: [email or web address]: 01/04/2022Prepared by: Donnetta Simpers BeardsleyExercises  Seated Hip Adduction Isometrics with Ball - 1 x daily - 7 x weekly - 2 sets - 10 reps  Supine Bridge with Mini Swiss Ball Between Knees - 1 x daily - 7 x weekly - 2 sets - 10 reps  Seated Knee Flexion with Anchored Resistance - 1 x daily - 7 x weekly - 2 sets - 10 reps  Seated Knee Extension with Resistance - 1 x daily - 7 x weekly - 2 sets - 10 reps    Problem List Patient Active Problem List   Diagnosis Date Noted  . Malnutrition of moderate degree 06/13/2020  . Rash and nonspecific skin eruption    . Metastatic malignant neoplasm ([place])   . Failure to thrive in adult   . Asymptomatic bacteriuria   . Pressure injury of skin 06/03/2020  . Cellulitis 06/03/2020  . SVT (supraventricular tachycardia) ([place]) 03/26/2020  . Normocytic anemia 03/24/2020  . Urinary tract infection without hematuria   . AMS (altered mental status) 03/23/2020  . Hypokalemia 03/23/2020  . Prolonged Q-T interval on ECG 03/23/2020  . DNR (do not resuscitate) 03/23/2020  . MRSA bacteremia 03/23/2020  . Acute combined systolic and diastolic heart failure ([place])   . Elevated troponin   . Dilated cardiomyopathy ([place])   . Coronary artery disease involving native coronary artery of native heart without angina pectoris   . Congestive heart failure (CHF) ([place]) 02/14/2020  . Pyelonephritis 09/10/2019  . Acute blood loss anemia 06/12/2019  . Closed fracture of distal end of right femur, initial encounter ([place]) 06/11/2019  . Closed fracture of lateral portion of right tibial plateau 06/11/2019  . History of DVT of lower extremity 06/11/2019  . Blurry vision 01/06/2019  . Headache 07/24/2016  . Cough 08/24/2015  . Chronic diarrhea 08/24/2015  . Sepsis ([place])   . Encounter for antineoplastic chemotherapy 01/31/2015  . Urinary retention 10/20/2014  . Ileus ([place]) 10/20/2014  . Cellulitis of leg, right 10/20/2014  . Fracture of right proximal fibula   . Ankle fracture 10/02/2014  . Ankle fracture 10/02/2014  .  Lung cancer ([place]) 10/02/2014  . Closed fibular fracture 10/02/2014  . Dementia ([place]) 10/02/2014  . Lower urinary tract infectious disease 10/02/2014  . Reactive airway disease 10/02/2014  . Fall 10/02/2014  . Closed left ankle fracture 10/01/2014  . Right fibular fracture 10/01/2014  . Lung metastasis ([place])   . LESION, ANUS 04/26/2009  . DIARRHEA 04/26/2009  . Primary cancer of right upper lobe of lung ([place]) 04/22/2009  . Malignant neoplasm of brain ([place]) 04/22/2009  . VITAMIN D DEFICIENCY 04/22/2009  .  ANAL FISTULA 04/22/2009  . OSTEOPOROSIS 04/22/2009    Voncille Lo, PT, [place] Certified Exercise Expert for the Aging Adult  08/23/20 6:02 PM Phone: [phone] Fax: [facility] [address] [place], [place], [address] Phone: [phone]   Fax:  [phone]  Name: Valerie [name] MRN: [id number] Date of Birth: Sep 13, 1945   Referring diagnosis?  Gait abnormality ;ORIF R tibial plateau fx and distal femur  Treatment diagnosis? (if different than referring diagnosis) muscle weakness What was this (referring dx) caused by? []  Surgery [x]  Fall [x]  Ongoing issue []  Arthritis [x]  Other: ___metastatic lung cancer_________  Laterality: [x]  Rt []  Lt [x]  Both  Check all possible CPT codes:      [x]  97110 (Therapeutic Exercise)  []  92507 (SLP Treatment)  [x]  97112 (Neuro Re-ed)   []  92526 (Swallowing Treatment)   [x]  97116 (Gait Training)   []  75436 (Cognitive Training, 1st 15 minutes) [x]  97140 (Manual Therapy)   []  97130 (Cognitive Training, each add'l 15 minutes)  [x]  97530 (Therapeutic Activities)  []  Other, List CPT Code ____________    [x]  06770 (Self Care)       []  All codes above (97110 - 97535)  []  97012 (Mechanical Traction)  []  97014 (E-stim Unattended)  []  97032 (E-stim manual)  []  97033 (Ionto)  []  97035 (Ultrasound)  []  97760 (Orthotic Fit) []  [id number] (Physical Performance Training) []  [id number] (Aquatic Therapy) []  97034 (Contrast Bath) []  [id number] (Paraffin) []  97597 (Wound Care 1st 20 sq cm) []  97598 (Wound Care each add'l 20 sq cm) []  97016 (Vasopneumatic Device) []  [id number] Comptroller) []  [id number] (Prosthetic Training)

## 2020-08-24 ENCOUNTER — Encounter: Payer: Self-pay | Admitting: Cardiology

## 2020-08-24 ENCOUNTER — Ambulatory Visit: Payer: Medicare PPO | Admitting: Cardiology

## 2020-08-24 VITALS — BP 97/59 | HR 88 | Wt 158.0 lb

## 2020-08-24 DIAGNOSIS — I420 Dilated cardiomyopathy: Secondary | ICD-10-CM

## 2020-08-24 DIAGNOSIS — I2510 Atherosclerotic heart disease of native coronary artery without angina pectoris: Secondary | ICD-10-CM

## 2020-08-24 DIAGNOSIS — Z7901 Long term (current) use of anticoagulants: Secondary | ICD-10-CM | POA: Diagnosis not present

## 2020-08-24 NOTE — Progress Notes (Signed)
Cardiology Office Note:    Date:  08/24/2020   ID:  Valerie, [name] 06/15/1946, MRN [id number]  PCP:  [name], MD  Cardiologist:  Buford Dresser, MD  Referring MD: [name], MD   Chief Complaint:  Follow up  History of Present Illness:    Valerie [name] is a 75 y.o. 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  Today: Doing well overall. Had 20 days of inpatient rehab from insurance. Now getting outpatient rehab. Working on transferring from chair to bed.   No issues with bleeding, has been on clopidogrel and rivaroxaban. Has intermittently low BP, husband give her the metoprolol only when she is going to be active. She is now six month post clopidogrel for ulcerated plaque.  We discussed options for evaluation of her cardiomyopathy. Nonischemic on cath. She has largely been unable to tolerate guideline directed medical therapy--only occasional metoprolol succinate. Discussed repeating echo, but as it would not change management, will not repeat.  Denies chest pain, shortness of breath at rest or with normal exertion. No PND, orthopnea, LE edema or unexpected weight gain. No syncope or palpitations.  Past Medical History:  Diagnosis Date  . Abnormal Pap smear 2006  . Anal fistula   . Ankle fracture   . Anxiety   . Arthritis   . Atrophic vaginitis 2008  . Cataract   . Dementia ([place]) 2009  . Dyspareunia 2008  . H/O osteoporosis   . H/O varicella   . H/O vitamin D deficiency   . Headache 07/24/2016  . Heart murmur   . History of measles, mumps, or rubella   . History of radiation therapy 07/28/13- 08/10/13   right lung metastasis 5000 cGy 10 sessions  . Hypertension   . Lung cancer ([place]) dx'd 2002  . Lung cancer ([place])   . Lung cancer ([place])   . Lung metastasis ([place])     PET scan 05/05/13, RUL lung nodule  . 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 Nodule of right lung CT- 06/03/12   RIGHT UPPER LOBE  . Nodule of right lung 06/03/12   Upper Lobe  . On antineoplastic chemotherapy    TARCEVA  . Osteoporosis 2010  . Primary cancer of right upper lobe of lung ([place]) 04/22/2009   Qualifier: Diagnosis of  By: Nils Pyle CMA ([place]), Mearl Latin    . Shortness of breath    hx lung ca   . Status post chemotherapy 2003   CARBOPLATIN/PACLITAXEL /STATUS POST CLINICAL TRAIL OF CELEBREX AND IRESSA AT [facility] FOR 1 YEAR  . Status post radiation therapy 2003   LEFT LUNG  . Status post radiation therapy 11/07/2005   WHOLE BRAIN: DR Larkin [facility]  . Status post radiation therapy 06/02/2008   GAMMA KNIFE OF RESECTED CAVITAY  . Thyroid adenoma    ?  [name] Thyroid cancer ([place]) 10/18/11 bx   adenoid nodules   . Yeast infection    Past Surgical History:  Procedure Laterality Date  . BRAIN SURGERY    . INTRAVASCULAR ULTRASOUND/IVUS N/A 02/16/2020   Procedure: Intravascular Ultrasound/IVUS;  Surgeon: Leonie Man, MD;  Location: [place] [facility];  Service: Cardiovascular;  Laterality: N/A;  . LEFT LOWER LOBECTOMY  05/2001  . LEFT OCCIPITAL CRANIOTOMY  09/21/2005   tumor  .  LUNG LOBECTOMY    . NECK SURGERY    . ORIF ANKLE FRACTURE Left 10/02/2014   Procedure: OPEN REDUCTION INTERNAL FIXATION (ORIF) ANKLE FRACTURE;  Surgeon: Alta Corning, MD;  Location: [facility];  Service: Orthopedics;  Laterality: Left;  . ORIF FEMUR FRACTURE Right 06/11/2019   Procedure: OPEN REDUCTION INTERNAL FIXATION (ORIF) DISTAL FEMUR FRACTURE;  Surgeon: Renette Butters, MD;  Location: [place];  Service: Orthopedics;  Laterality: Right;  . ORIF TIBIA PLATEAU Right 06/11/2019   Procedure: Open Reduction Internal Fixation (Orif) Tibial Plateau;  Surgeon: Renette Butters, MD;  Location: [place];  Service: Orthopedics;  Laterality: Right;  . RADICAL NECK DISSECTION   10/18/2011   Procedure: RADICAL NECK DISSECTION;  Surgeon: Izora Gala, MD;  Location: [place];  Service: ENT;  Laterality: Right;  RIGHT MODIFIED NECK DISSECTION /POSSIBLE RIGHT THYROIDECTOM  . RIGHT/LEFT HEART CATH AND CORONARY ANGIOGRAPHY N/A 02/16/2020   Procedure: RIGHT/LEFT HEART CATH AND CORONARY ANGIOGRAPHY;  Surgeon: Leonie Man, MD;  Location: [place] [facility];  Service: Cardiovascular;  Laterality: N/A;  . TEE WITHOUT CARDIOVERSION N/A 03/28/2020   Procedure: TRANSESOPHAGEAL ECHOCARDIOGRAM (TEE);  Surgeon: Buford Dresser, MD;  Location[facility];  Service: Cardiovascular;  Laterality: N/A;  . THYROIDECTOMY  10/18/2011   Procedure: THYROIDECTOMY WITH RADICAL NECK DISSECTION;  Surgeon: Izora Gala, MD;  Location: [place];  Service: ENT;  Laterality: Right;     Current Meds  Medication Sig  . Ascorbic Acid (VITAMIN C) 100 MG tablet Take 100 mg by mouth every evening. Gummie  . Cholecalciferol (D3 PO) Take 1 capsule by mouth daily.  . clopidogrel (PLAVIX) 75 MG tablet Take 1 tablet (75 mg total) by mouth daily with breakfast.  . diphenhydrAMINE (BENADRYL) 25 MG tablet Take 25 mg by mouth every 6 (six) hours as needed for allergies.  [name] donepezil (ARICEPT) 10 MG tablet Take 10 mg by mouth at bedtime.  . erlotinib (TARCEVA) 100 MG tablet Take 1 tablet (100 mg total) by mouth daily. Take on an empty stomach 1 hour before meals or 2 hours after  . hydrocerin (EUCERIN) CREA Apply 1 application topically 2 (two) times daily. To back and buttocks.  . loperamide (IMODIUM A-D) 2 MG tablet Take 4 mg by mouth in the morning and at bedtime.  [name] MAGNESIUM PO Take 1 tablet by mouth daily.  . metoprolol succinate (TOPROL-XL) 25 MG 24 hr tablet Take 1 tablet (25 mg total) by mouth daily.  . Multiple Vitamin (MULTI-VITAMINS) TABS Take 1 tablet by mouth every evening.   . potassium chloride SA (KLOR-CON) 20 MEQ tablet Take 20 mEq by mouth daily.  . prochlorperazine (COMPAZINE) 10 MG tablet Take  1 tablet (10 mg total) by mouth every 6 (six) hours as needed for nausea or vomiting.  . vitamin B-12 (CYANOCOBALAMIN) 1000 MCG tablet Take 1,000 mcg by mouth every evening.  [name] 20 MG TABS tablet TAKE 1 TABLET BY MOUTH ONCE DAILY IN THE EVENING  . [DISCONTINUED] Nystatin (GERHARDT'S BUTT CREAM) CREA Apply 1 application topically 2 (two) times daily.  . [DISCONTINUED] nystatin (MYCOSTATIN/NYSTOP) powder Apply topically 2 (two) times daily. To breast folds and groin  . [DISCONTINUED] zinc oxide (BALMEX) 11.3 % CREA cream Apply 1 application topically as needed (lesions/buttocks).     Allergies:   Patient has no known allergies.   Social History   Tobacco Use  . Smoking status: Never Smoker  . Smokeless tobacco: Never Used  . Tobacco comment: smoked few years in  college  Vaping Use  . Vaping Use: Never used  Substance Use Topics  . Alcohol use: No  . Drug use: No     Family Hx: The patient's family history includes Cancer in her father, maternal aunt, and mother; Gait disorder in her mother; Heart failure in her sister.  ROS:   Please see the history of present illness.    All other systems reviewed and are negative.   Prior CV studies:   The following studies were reviewed today:  TEE 03/28/20 1. Left ventricular ejection fraction, by estimation, is 25 to 30%. The  left ventricle has severely decreased function. The left ventricle  demonstrates global hypokinesis.  2. Right ventricular systolic function is normal. The right ventricular  size is normal. There is mildly elevated pulmonary artery systolic  pressure.  3. Left atrial size was mild to moderately dilated. No left atrial/left  atrial appendage thrombus was detected.  4. The mitral valve is normal in structure. Trivial mitral valve  regurgitation.  5. The aortic valve is tricuspid. Aortic valve regurgitation is mild.  Mild aortic valve sclerosis is present, with no evidence of aortic valve  stenosis.   6. There is mild (Grade II) plaque involving the descending aorta.  7. Evidence of atrial level shunting detected by color flow Doppler.  There is a small patent foramen ovale with predominantly left to right  shunting across the atrial septum.   Conclusion(s)/Recommendation(s): No evidence of vegetation/infective  endocarditis on this transesophageal echocardiogram.   Echo 03/24/20 1. Left ventricular ejection fraction, by estimation, is 25 to 30%. The  left ventricle has severely decreased function. The left ventricle  demonstrates global hypokinesis. Left ventricular diastolic parameters are  consistent with Grade I diastolic  dysfunction (impaired relaxation).  2. Right ventricular systolic function is normal. The right ventricular  size is normal. Tricuspid regurgitation signal is inadequate for assessing  PA pressure.  3. Left atrial size was mildly dilated.  4. The mitral valve is normal in structure. Mild mitral valve  regurgitation. No evidence of mitral stenosis.  5. The aortic valve is tricuspid. Aortic valve regurgitation is mild.  Mild aortic valve sclerosis is present, with no evidence of aortic valve  stenosis.  6. The inferior vena cava is normal in size with greater than 50%  respiratory variability, suggesting right atrial pressure of 3 mmHg.   Echocardiogram 02/15/20: 1. Left ventricular ejection fraction, by estimation, is 20 to 25%. The  left ventricle has severely decreased function. The left ventricle  demonstrates global hypokinesis. Left ventricular diastolic parameters are  consistent with Grade I diastolic  dysfunction (impaired relaxation).  2. Right ventricular systolic function is normal. The right ventricular  size is normal. There is mildly elevated pulmonary artery systolic  pressure.  3. The mitral valve is abnormal. Mild to moderate mitral valve  regurgitation.  4. The aortic valve is grossly normal. Aortic valve regurgitation is   mild.  5. The inferior vena cava is dilated in size with >50% respiratory  variability, suggesting right atrial pressure of 8 mmHg.   Right/left heart catheterization 02/16/20:  Hemodynamic findings consistent with mild (borderline) pulmonary hypertension.  LV end diastolic pressure is normal.  Ost Cx to Prox Cx lesion is 55% stenosed. -> Somewhat ulcerated mixed plaque on IVUS-estimated 50-60%, not flow-limiting by FFR (0.98),  Ost LAD to Prox LAD lesion is 30% stenosed.  Prox RCA lesion is 20% stenosed.  Tortuous coronary arteries  SUMMARY  Most likely NONISCHEMIC CARDIOMYOPATHY -> well  compensated/adequately diuresed  Moderate single-vessel CAD involving ostial and proximal LCx subacute plaque rupture-DFR and FFR negative (55 to 60% stenosis)  Otherwise minimal CAD with proximal LAD and RCA 20 to 30% stenosis.  Right heart cath pressures show PCWP 18 mmHg with V wave of 24 mmHg from MR, LVEDP 14 mmHg. PA diastolic 26 mm;  Cardiac Output-Index: 6.88-3.8  Labs/Other Tests and Data Reviewed:    EKG:  An ECG dated 06/11/20 was personally reviewed today and demonstrated:  sinus tachycardia, LBBB  Recent Labs: 02/15/2020: TSH 2.095 06/12/2020: B Natriuretic Peptide 202.7 06/13/2020: Magnesium 2.0 08/01/2020: ALT 9; BUN 18; Creatinine 0.91; Hemoglobin 11.3; Platelet Count 302; Potassium 4.3; Sodium 139   Recent Lipid Panel Lab Results  Component Value Date/Time   CHOL 189 04/20/2020 12:00 AM   CHOL 263 (H) 10/16/2018 01:00 PM   TRIG 130 04/20/2020 12:00 AM   HDL 69 04/20/2020 12:00 AM   HDL 90 10/16/2018 01:00 PM   CHOLHDL 3.6 02/15/2020 05:50 AM   LDLCALC 97 04/20/2020 12:00 AM   LDLCALC 143 (H) 10/16/2018 01:00 PM    Wt Readings from Last 3 Encounters:  08/24/20 158 lb (71.7 kg)  06/03/20 130 lb (59 kg)  03/23/20 153 lb 12.8 oz (69.8 kg)     Objective:    Vital Signs:  BP (!) 97/59   Pulse 88   Wt 158 lb (71.7 kg)   SpO2 99%   BMI 24.75 kg/m     GEN: Frail appearing, in wheelchair, in no acute distress HEENT: Normal, moist mucous membranes NECK: No JVD CARDIAC: regular rhythm, normal S1 and S2, no rubs or gallops. 2/6 systolic murmur. VASCULAR: Radial and DP pulses 2+ bilaterally. No carotid bruits RESPIRATORY:  Clear to auscultation without rales, wheezing or rhonchi  ABDOMEN: Soft, non-tender, non-distended MUSCULOSKELETAL:  Limited but moves all 4 limbs independently SKIN: Warm and dry, no edema NEUROLOGIC:  Alert and oriented x 3. PSYCHIATRIC:  Normal affect   ASSESSMENT & PLAN:    Nonischemic cardiomyopathy -tagrisso stopped per oncology, was on tarceve but this was stopped due to cellulitis -unable to use ACEi/ARB/ARNI/MRA due to hypotension -tolerating metoprolol succinate 25 mg only on ocasion -euvolemic on exam. -discussed repeat echo. After shared decision making, they would like to hold on echo at this time. I agree -I am concerned about an ICD in her. She is bedbound chronically and has history of PVC with aberrancy. Given her overall frailty and risk of inappropriate shocks, I do not think she is a good candidate for ICD.  Nonobstructive CAD: -stop clopidogrel today as >6 mos post cath -continue rivaroxaban for DVT history, long term anticoagulation -continue atorvastatin 80 mg daily  Follow up: 6 mos or sooner as needed  Medication Adjustments/Labs and Tests Ordered: Current medicines are reviewed at length with the patient today.  Concerns regarding medicines are outlined above.   Tests Ordered: No orders of the defined types were placed in this encounter.   Medication Changes: No orders of the defined types were placed in this encounter.  Patient Instructions  Medication Instructions:  Stop: Plavix 75 mg  *If you need a refill on your cardiac medications before your next appointment, please call your pharmacy*   Lab Work: None   Testing/Procedures: None   Follow-Up: At [facility], you and your health needs are our priority.  As part of our continuing mission to provide you with exceptional heart care, we have created designated Provider Care Teams.  These Care Teams include  your primary Cardiologist (physician) and Advanced Practice Providers (APPs -  Physician Assistants and Nurse Practitioners) who all work together to provide you with the care you need, when you need it.  We recommend signing up for the patient portal called "MyChart".  Sign up information is provided on this After Visit Summary.  MyChart is used to connect with patients for Virtual Visits (Telemedicine).  Patients are able to view lab/test results, encounter notes, upcoming appointments, etc.  Non-urgent messages can be sent to your provider as well.   To learn more about what you can do with MyChart, go to [email or web address].    Your next appointment:   6 month(s)  The format for your next appointment:   In Person  Provider:   Buford Dresser, MD     Signed, Buford Dresser, MD  08/24/2020  [place]

## 2020-08-24 NOTE — Patient Instructions (Signed)
Medication Instructions:  Stop: Plavix 75 mg  *If you need a refill on your cardiac medications before your next appointment, please call your pharmacy*   Lab Work: None   Testing/Procedures: None   Follow-Up: At [facility], you and your health needs are our priority.  As part of our continuing mission to provide you with exceptional heart care, we have created designated Provider Care Teams.  These Care Teams include your primary Cardiologist (physician) and Advanced Practice Providers (APPs -  Physician Assistants and Nurse Practitioners) who all work together to provide you with the care you need, when you need it.  We recommend signing up for the patient portal called "MyChart".  Sign up information is provided on this After Visit Summary.  MyChart is used to connect with patients for Virtual Visits (Telemedicine).  Patients are able to view lab/test results, encounter notes, upcoming appointments, etc.  Non-urgent messages can be sent to your provider as well.   To learn more about what you can do with MyChart, go to [email or web address].    Your next appointment:   6 month(s)  The format for your next appointment:   In Person  Provider:   Buford Dresser, MD

## 2020-08-25 ENCOUNTER — Encounter: Payer: Self-pay | Admitting: Physical Therapy

## 2020-08-25 ENCOUNTER — Other Ambulatory Visit: Payer: Self-pay

## 2020-08-25 ENCOUNTER — Ambulatory Visit: Payer: Medicare PPO | Admitting: Physical Therapy

## 2020-08-25 DIAGNOSIS — R293 Abnormal posture: Secondary | ICD-10-CM

## 2020-08-25 DIAGNOSIS — M6281 Muscle weakness (generalized): Secondary | ICD-10-CM

## 2020-08-25 DIAGNOSIS — R2689 Other abnormalities of gait and mobility: Secondary | ICD-10-CM

## 2020-08-26 NOTE — Therapy (Signed)
[place], [place], [address] Phone: [phone]   Fax:  [phone]  Physical Therapy Treatment  Patient Details  Name: Valerie [name] MRN: [id number] Date of Birth: April 09, 1946 Referring Provider (PT): [name] MD   Encounter Date: 08/25/2020   PT End of Session - 08/25/20 1419    Visit Number 2    Number of Visits 24    Date for PT Re-Evaluation 11/15/20    Authorization Type [employer] MCR  10 VISIT PROGRESS NOTE; KX AT 15    PT Start Time 1355    PT Stop Time 1440    PT Time Calculation (min) 45 min    Activity Tolerance Patient tolerated treatment well    Behavior During Therapy [facility] for tasks assessed/performed           Past Medical History:  Diagnosis Date  . Abnormal Pap smear 2006  . Anal fistula   . Ankle fracture   . Anxiety   . Arthritis   . Atrophic vaginitis 2008  . Cataract   . Dementia ([place]) 2009  . Dyspareunia 2008  . H/O osteoporosis   . H/O varicella   . H/O vitamin D deficiency   . Headache 07/24/2016  . Heart murmur   . History of measles, mumps, or rubella   . History of radiation therapy 07/28/13- 08/10/13   right lung metastasis 5000 cGy 10 sessions  . Hypertension   . Lung cancer ([place]) dx'd 2002  . Lung cancer ([place])   . Lung cancer ([place])   . Lung metastasis ([place])    PET scan 05/05/13, RUL lung nodule  . 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 Nodule of right lung CT- 06/03/12   RIGHT UPPER LOBE  . Nodule of right lung 06/03/12   Upper Lobe  . On antineoplastic chemotherapy    TARCEVA  . Osteoporosis 2010  . Primary cancer of right upper lobe of lung ([place]) 04/22/2009   Qualifier: Diagnosis of  By: Nils Pyle CMA ([place]), Mearl Latin    . Shortness of breath    hx lung ca   . Status post chemotherapy 2003   CARBOPLATIN/PACLITAXEL /STATUS POST CLINICAL TRAIL OF CELEBREX AND IRESSA AT [facility] FOR 1 YEAR  . Status post  radiation therapy 2003   LEFT LUNG  . Status post radiation therapy 11/07/2005   WHOLE BRAIN: DR L[facility]  . Status post radiation therapy 06/02/2008   GAMMA KNIFE OF RESECTED CAVITAY  . Thyroid adenoma    ?  [name] Thyroid cancer ([place]) 10/18/11 bx   adenoid nodules   . Yeast infection     Past Surgical History:  Procedure Laterality Date  . BRAIN SURGERY    . INTRAVASCULAR ULTRASOUND/IVUS N/A 02/16/2020   Procedure: Intravascular Ultrasound/IVUS;  Surgeon: Leonie Man, MD;  Location: [facility];  Service: Cardiovascular;  Laterality: N/A;  . LEFT LOWER LOBECTOMY  05/2001  . LEFT OCCIPITAL CRANIOTOMY  09/21/2005   tumor  . LUNG LOBECTOMY    . NECK SURGERY    . ORIF ANKLE FRACTURE Left 10/02/2014   Procedure: OPEN REDUCTION INTERNAL FIXATION (ORIF) ANKLE FRACTURE;  Surgeon: Alta Corning, MD;  Location: [facility];  Service: Orthopedics;  Laterality: Left;  . ORIF FEMUR FRACTURE Right 06/11/2019   Procedure: OPEN REDUCTION INTERNAL FIXATION (ORIF) DISTAL FEMUR FRACTURE;  Surgeon: Renette Butters, MD;  Location: [place];  Service: Orthopedics;  Laterality: Right;  . ORIF TIBIA PLATEAU Right 06/11/2019   Procedure: Open Reduction Internal Fixation (Orif) Tibial Plateau;  Surgeon: Renette Butters, MD;  Location: [place];  Service: Orthopedics;  Laterality: Right;  . RADICAL NECK DISSECTION  10/18/2011   Procedure: RADICAL NECK DISSECTION;  Surgeon: Izora Gala, MD;  Location: [place];  Service: ENT;  Laterality: Right;  RIGHT MODIFIED NECK DISSECTION /POSSIBLE RIGHT THYROIDECTOM  . RIGHT/LEFT HEART CATH AND CORONARY ANGIOGRAPHY N/A 02/16/2020   Procedure: RIGHT/LEFT HEART CATH AND CORONARY ANGIOGRAPHY;  Surgeon: Leonie Man, MD;  Location: [place];  Service: Cardiovascular;  Laterality: N/A;  . TEE WITHOUT CARDIOVERSION N/A 03/28/2020   Procedure: TRANSESOPHAGEAL ECHOCARDIOGRAM (TEE);  Surgeon: Buford Dresser, MD;  Location: [facility];  Service: Cardiovascular;   Laterality: N/A;  . THYROIDECTOMY  10/18/2011   Procedure: THYROIDECTOMY WITH RADICAL NECK DISSECTION;  Surgeon: Izora Gala, MD;  Location: [place];  Service: ENT;  Laterality: Right;    There were no vitals filed for this visit.   Subjective Assessment - 08/25/20 1413    Subjective Patient reports she is feeling pretty good today. She had no complaints after the last PT session.    How long can you sit comfortably? can sit in a wheelchar without difficulty    How long can you stand comfortably? can not stand.    How long can you walk comfortably? can not walk    Diagnostic tests recent hospitalization    Currently in Pain? No/denies                             [place] [facility] PT Treatment/Exercise - 08/26/20 0001      Transfers   Comments tried to transfer with transfer borad. Patient had difficulty using transfer board. W ehad a hard time getting it under her. We used a squat pivot transfer to the tsable. On the way back she was able to coot to the chair without a transfer board. Patient perfromedsit to stand transfer 4x. on each trail she required mod a to stand. her feet were slipping on the floor. Therapy tried to block her legs but she still had difficulty.      Therapeutic Activites    Other Therapeutic Activities scooting up and down the table 4 scoots each way x2. Fatuge and more difficulty going to the right                  PT Education - 08/25/20 1416    Education Details reviewez HEP and symptom management    Person(s) Educated Patient    Methods Demonstration;Tactile cues;Verbal cues;Explanation    Comprehension Verbalized understanding;Returned demonstration;Verbal cues required;Tactile cues required            PT Short Term Goals - 08/23/20 1428      PT SHORT TERM GOAL #1   Title Patient will transfer sit to stand with min a    Baseline max A on eval    Time 6    Period Weeks    Status New    Target Date 10/04/20      PT SHORT TERM  GOAL #2   Title Patient will stand for 60 seconds without significant fatigue    Baseline Pt attempted 35 sec then 15 sec then 15 sec fatigues easily    Time 6    Period Weeks    Status New    Target Date 10/04/20  PT SHORT TERM GOAL #3   Title Patient will use walker to take a step    Baseline Pt unable to take a step at eval    Time 6    Period Weeks    Status New    Target Date 10/04/20      PT SHORT TERM GOAL #4   Title Pt will use sliding board and min A from W/C to mat    Baseline Pt requires mod to Max A due to fatigue and requires extra time to complete movement    Time 6    Period Weeks    Status New    Target Date 10/04/20             PT Long Term Goals - 08/23/20 1753      PT LONG TERM GOAL #1   Title Patient will ambulate 10' with mod A and AD    Time 12    Period Weeks    Status New    Target Date 11/15/20      PT LONG TERM GOAL #2   Title Patient will trnasfer from her wheelchair to a regular chair with min gaurd with walker    Time 12    Period Weeks    Status New    Target Date 11/15/20      PT LONG TERM GOAL #3   Title Patient will stand for 5 minutes in order to perfrom ADL's at the sink    Baseline Eval 35 sec , then 15 sec fatigues easily    Time 12    Status New    Target Date 11/15/20      PT LONG TERM GOAL #4   Title Pt will be able to perform full range bridge in order to perform bed mobility with 50% greater ease    Time 12    Period Weeks    Status New    Target Date 11/15/20                 Plan - 08/25/20 1420    Clinical Impression Statement Patients feet slid out on the floor with standing but she was able to stand with a foot block 4x ( 30, 10, 15, 24 sec). She hasd some difficulty with her slide board. She ffeels like it is not as slick as the one at rehab.We used a stand pivot transfer to transfer to the chair and back to the table. She tolerated light ther-ex well. She had difficulty with her feet slkipping out  on the board and ggetting her weight forward in standing. She also worked on [employer] and back down the table.    Personal Factors and Comorbidities Comorbidity 1;Comorbidity 2;Comorbidity 3+    Comorbidities History of lung cancer for 18 years. ORIF Rt tibial plateau 06-11-19, recent hospitalization due to cellulitis and sepsis  See medical hx    Examination-Activity Limitations Bed Mobility;Bathing;Bend;Carry;Lift;Dressing;Locomotion Level;Squat;Stairs;Stand;Toileting;Transfers    Examination-Participation Restrictions Church;Community Activity;Cleaning;Laundry;Medication Management    Stability/Clinical Decision Making Unstable/Unpredictable    Clinical Decision Making High    Rehab Potential Fair    PT Frequency 2x / week    PT Duration 12 weeks    PT Treatment/Interventions ADLs/Self Care Home Management;Cryotherapy;Gait training;Stair training;Functional mobility training;Therapeutic activities;Therapeutic exercise;Neuromuscular re-education;Patient/family education;Manual techniques    PT Next Visit Plan Pt needs to work on transfers with sliding board for functional strength.  review HEP with resistance (yellow to challenge in sitting)    PT Home Exercise Plan  [id number]    Consulted and Agree with Plan of Care Patient           Patient will benefit from skilled therapeutic intervention in order to improve the following deficits and impairments:  Abnormal gait,Decreased activity tolerance,Decreased endurance,Difficulty walking,Decreased mobility,Decreased strength,Pain,Postural dysfunction  Visit Diagnosis: Other abnormalities of gait and mobility  Muscle weakness (generalized)  Abnormal posture     Problem List Patient Active Problem List   Diagnosis Date Noted  . Malnutrition of moderate degree 06/13/2020  . Rash and nonspecific skin eruption   . Metastatic malignant neoplasm ([place])   . Failure to thrive in adult   . Asymptomatic bacteriuria   . Pressure injury of  skin 06/03/2020  . Cellulitis 06/03/2020  . SVT (supraventricular tachycardia) ([place]) 03/26/2020  . Normocytic anemia 03/24/2020  . Urinary tract infection without hematuria   . AMS (altered mental status) 03/23/2020  . Hypokalemia 03/23/2020  . Prolonged Q-T interval on ECG 03/23/2020  . DNR (do not resuscitate) 03/23/2020  . MRSA bacteremia 03/23/2020  . Acute combined systolic and diastolic heart failure ([place])   . Elevated troponin   . Dilated cardiomyopathy ([place])   . Coronary artery disease involving native coronary artery of native heart without angina pectoris   . Congestive heart failure (CHF) ([place]) 02/14/2020  . Pyelonephritis 09/10/2019  . Acute blood loss anemia 06/12/2019  . Closed fracture of distal end of right femur, initial encounter ([place]) 06/11/2019  . Closed fracture of lateral portion of right tibial plateau 06/11/2019  . History of DVT of lower extremity 06/11/2019  . Blurry vision 01/06/2019  . Headache 07/24/2016  . Cough 08/24/2015  . Chronic diarrhea 08/24/2015  . Sepsis ([place])   . Encounter for antineoplastic chemotherapy 01/31/2015  . Urinary retention 10/20/2014  . Ileus ([place]) 10/20/2014  . Cellulitis of leg, right 10/20/2014  . Fracture of right proximal fibula   . Ankle fracture 10/02/2014  . Ankle fracture 10/02/2014  . Lung cancer ([place]) 10/02/2014  . Closed fibular fracture 10/02/2014  . Dementia ([place]) 10/02/2014  . Lower urinary tract infectious disease 10/02/2014  . Reactive airway disease 10/02/2014  . Fall 10/02/2014  . Closed left ankle fracture 10/01/2014  . Right fibular fracture 10/01/2014  . Lung metastasis ([place])   . LESION, ANUS 04/26/2009  . DIARRHEA 04/26/2009  . Primary cancer of right upper lobe of lung ([place]) 04/22/2009  . Malignant neoplasm of brain ([place]) 04/22/2009  . VITAMIN D DEFICIENCY 04/22/2009  . ANAL FISTULA 04/22/2009  . OSTEOPOROSIS 04/22/2009    Car[facility] Living 08/26/2020, 10:15 AM  [facility] [address] [place], [place], [address] Phone: [phone]   Fax:  [phone]  Name: NAVEAH BRAVE MRN: [id number] Date of Birth: 1946/07/06

## 2020-08-29 ENCOUNTER — Other Ambulatory Visit: Payer: Medicare PPO

## 2020-08-29 ENCOUNTER — Encounter: Payer: Medicare PPO | Admitting: Physical Therapy

## 2020-08-29 ENCOUNTER — Encounter: Payer: Self-pay | Admitting: Cardiology

## 2020-08-30 ENCOUNTER — Ambulatory Visit (HOSPITAL_COMMUNITY)
Admission: RE | Admit: 2020-08-30 | Discharge: 2020-08-30 | Disposition: A | Discharge status: Home / Self Care | Payer: Medicare PPO | Source: Ambulatory Visit | Attending: Internal Medicine | Admitting: Internal Medicine

## 2020-08-30 ENCOUNTER — Other Ambulatory Visit: Payer: Self-pay

## 2020-08-30 ENCOUNTER — Inpatient Hospital Stay: Payer: Medicare PPO | Attending: Internal Medicine

## 2020-08-30 ENCOUNTER — Encounter (HOSPITAL_COMMUNITY): Payer: Self-pay

## 2020-08-30 DIAGNOSIS — C3490 Malignant neoplasm of unspecified part of unspecified bronchus or lung: Secondary | ICD-10-CM | POA: Diagnosis not present

## 2020-08-30 DIAGNOSIS — Z79899 Other long term (current) drug therapy: Secondary | ICD-10-CM | POA: Insufficient documentation

## 2020-08-30 DIAGNOSIS — C3432 Malignant neoplasm of lower lobe, left bronchus or lung: Secondary | ICD-10-CM | POA: Diagnosis not present

## 2020-08-30 DIAGNOSIS — N3289 Other specified disorders of bladder: Secondary | ICD-10-CM | POA: Diagnosis not present

## 2020-08-30 DIAGNOSIS — J90 Pleural effusion, not elsewhere classified: Secondary | ICD-10-CM | POA: Diagnosis not present

## 2020-08-30 DIAGNOSIS — M4854XA Collapsed vertebra, not elsewhere classified, thoracic region, initial encounter for fracture: Secondary | ICD-10-CM | POA: Diagnosis not present

## 2020-08-30 DIAGNOSIS — M4856XA Collapsed vertebra, not elsewhere classified, lumbar region, initial encounter for fracture: Secondary | ICD-10-CM | POA: Diagnosis not present

## 2020-08-30 DIAGNOSIS — K838 Other specified diseases of biliary tract: Secondary | ICD-10-CM | POA: Diagnosis not present

## 2020-08-30 DIAGNOSIS — K8689 Other specified diseases of pancreas: Secondary | ICD-10-CM | POA: Diagnosis not present

## 2020-08-30 LAB — CBC WITH DIFFERENTIAL (CANCER CENTER ONLY)
Abs Immature Granulocytes: 0.02 10*3/uL (ref 0.00–0.07)
Basophils Absolute: 0 10*3/uL (ref 0.0–0.1)
Basophils Relative: 0 %
Eosinophils Absolute: 0.2 10*3/uL (ref 0.0–0.5)
Eosinophils Relative: 3 %
HCT: 38.2 % (ref 36.0–46.0)
Hemoglobin: 12 g/dL (ref 12.0–15.0)
Immature Granulocytes: 0 %
Lymphocytes Relative: 16 %
Lymphs Abs: 1.2 10*3/uL (ref 0.7–4.0)
MCH: 29.1 pg (ref 26.0–34.0)
MCHC: 31.4 g/dL (ref 30.0–36.0)
MCV: 92.7 fL (ref 80.0–100.0)
Monocytes Absolute: 0.6 10*3/uL (ref 0.1–1.0)
Monocytes Relative: 8 %
Neutro Abs: 5.3 10*3/uL (ref 1.7–7.7)
Neutrophils Relative %: 73 %
Platelet Count: 312 10*3/uL (ref 150–400)
RBC: 4.12 MIL/uL (ref 3.87–5.11)
RDW: 14.3 % (ref 11.5–15.5)
WBC Count: 7.2 10*3/uL (ref 4.0–10.5)
nRBC: 0 % (ref 0.0–0.2)

## 2020-08-30 LAB — CMP (CANCER CENTER ONLY)
ALT: 8 U/L (ref 0–44)
AST: 9 U/L — ABNORMAL LOW (ref 15–41)
Albumin: 3.5 g/dL (ref 3.5–5.0)
Alkaline Phosphatase: 116 U/L (ref 38–126)
Anion gap: 9 (ref 5–15)
BUN: 15 mg/dL (ref 8–23)
CO2: 26 mmol/L (ref 22–32)
Calcium: 9.9 mg/dL (ref 8.9–10.3)
Chloride: 104 mmol/L (ref 98–111)
Creatinine: 1.09 mg/dL — ABNORMAL HIGH (ref 0.44–1.00)
GFR, Estimated: 53 mL/min — ABNORMAL LOW (ref >60)
Glucose, Bld: 114 mg/dL — ABNORMAL HIGH (ref 70–99)
Potassium: 4.4 mmol/L (ref 3.5–5.1)
Sodium: 139 mmol/L (ref 135–145)
Total Bilirubin: 0.9 mg/dL (ref 0.3–1.2)
Total Protein: 7.1 g/dL (ref 6.5–8.1)

## 2020-08-30 MED ORDER — IOHEXOL 9 MG/ML PO SOLN
1000.0000 mL | ORAL | Status: AC
Start: 2020-08-30 — End: 2020-08-30
  Administered 2020-08-30: 1000 mL via ORAL

## 2020-08-30 MED ORDER — IOHEXOL 300 MG/ML  SOLN
100.0000 mL | Freq: Once | INTRAMUSCULAR | Status: AC | PRN
  Administered 2020-08-30: 100 mL via INTRAVENOUS

## 2020-08-30 MED ORDER — IOHEXOL 9 MG/ML PO SOLN
ORAL | Status: AC
  Filled 2020-08-30: qty 1000

## 2020-08-30 MED FILL — ERLOTINIB HCL 100 MG TABS: 100 | 30 days supply | Qty: 30 | Fill #2

## 2020-09-01 ENCOUNTER — Other Ambulatory Visit: Payer: Self-pay | Admitting: Internal Medicine

## 2020-09-01 ENCOUNTER — Ambulatory Visit: Payer: Medicare PPO | Admitting: Physical Therapy

## 2020-09-01 DIAGNOSIS — Z86718 Personal history of other venous thrombosis and embolism: Secondary | ICD-10-CM

## 2020-09-05 ENCOUNTER — Inpatient Hospital Stay (HOSPITAL_BASED_OUTPATIENT_CLINIC_OR_DEPARTMENT_OTHER): Payer: Medicare PPO | Admitting: Internal Medicine

## 2020-09-05 ENCOUNTER — Other Ambulatory Visit: Payer: Self-pay

## 2020-09-05 DIAGNOSIS — C3411 Malignant neoplasm of upper lobe, right bronchus or lung: Secondary | ICD-10-CM | POA: Diagnosis not present

## 2020-09-05 DIAGNOSIS — Z5111 Encounter for antineoplastic chemotherapy: Secondary | ICD-10-CM

## 2020-09-05 DIAGNOSIS — N390 Urinary tract infection, site not specified: Secondary | ICD-10-CM | POA: Diagnosis not present

## 2020-09-05 NOTE — Progress Notes (Signed)
[place] [phone]   Fax:([phone]) [phone]  PROGRESS NOTE FOR TELEMEDICINE VISITS  [name], [address] [address]  I connected [phone] on 09/05/20 at  1:45 PM EST by telephone visit and verified that I am speaking with the correct person using two identifiers.   I discussed the limitations, risks, security and privacy concerns of performing an evaluation and management service by telemedicine and the availability of in-person appointments. I also discussed with the patient that there may be a patient responsible charge related to this service. The patient expressed understanding and agreed to proceed.  Other persons participating in the visit and their role in the encounter: Her husband  Patient's location:  Home Provider's location: [place].  PRINCIPAL DIAGNOSIS: metastatic non-small cell lung cancer, adenocarcinoma with brain metastases initially diagnosed as a stage IIb in October 2002.   BIOMARKERS TESTING ([facility]): Positive for: EGFR [id number] del, T790M, CTNNB1 D32G, SMAD4 T137fs*10 Negative for: RET, ALK, BRAF, KRAS, ERBB2 and MET  PRIOR THERAPY:  1. Status post left lower lobectomy under the care of Dr Arlyce Dice in October 2002. 2. Status post course of concurrent chemoradiation with weekly carboplatin and paclitaxel under the care of Dr Benay Spice and Dr Elba Barman in early 2003. 3. Status post treatment with Celebrex and Iressa according to the clinical trial at [facility] for a total of 1 year. 4. Status post left occipital craniotomy for tumor resection on September 21, 2005, under the care of Dr Christella Noa. 5. Status post whole brain irradiation under the care of Dr Elba Barman, completed November 07, 2005. 6. Status post gamma knife treatment to the resected cavitary recurrence on June 02, 2008, at [facility]. 7. Status post right thyroidectomy with radical neck dissection under the  care of Dr. Constance Holster on 10/18/2011. 8. Status post excisional biopsy of right cervical lymph node that was consistent with metastatic adenocarcinoma. 9. Tarceva 150 mg by mouth daily with therapy beginning 06/03/2007, discontinued in July 2015 secondary to disease progression. Status post 79 cycles. 10. palliative radiotherapy to the enlarging right upper lobe lung nodule under the care of Dr. Valere Dross. 11. Treatment at [facility] on STUDY [id number] 300 mg by mouth daily for 21 days discontinued secondary to study deviation and inability for the patient to keep her appointment at [facility] secondary to recent hip fractures. 12. Open reduction and internal fixation of trimalleolar ankle fracture dislocation with fixation of the fibula as well as medial malleolus. 13. Tagrisso 80 mg by mouth daily started 11/25/2014, status post 62 months of treatment.  This was discontinued on 02/13/2020 secondary to congestive heart failure with ejection fraction of 20-25%.   CURRENT THERAPY: Tarceva 150 mg p.o. daily.  Started 02/26/2020.  This will be reduced to Tarceva 100 mg p.o. daily starting July 01, 2020.  INTERVAL HISTORY: Valerie [name] 75 y.o. female has a telephone virtual visit with me today for evaluation and discussion of her scan results.  The patient is feeling fine today with no concerning complaints except for the weakness of the lower extremities and she is currently undergoing physical therapy.  She denied having any current chest pain, shortness of breath, cough or hemoptysis.  She denied having any fever or chills.  She has no nausea, vomiting, diarrhea or constipation.  She is currently on treatment with amoxicillin for chronic UTI.  The patient continues to tolerate her treatment with Tarceva fairly well.  She  had repeat CT scan of the chest, abdomen pelvis performed recently and we are having the visit for evaluation and discussion of her scan results.   MEDICAL  HISTORY: Past Medical History:  Diagnosis Date  . Abnormal Pap smear 2006  . Anal fistula   . Ankle fracture   . Anxiety   . Arthritis   . Atrophic vaginitis 2008  . Cataract   . Dementia ([place]) 2009  . Dyspareunia 2008  . H/O osteoporosis   . H/O varicella   . H/O vitamin D deficiency   . Headache 07/24/2016  . Heart murmur   . History of measles, mumps, or rubella   . History of radiation therapy 07/28/13- 08/10/13   right lung metastasis 5000 cGy 10 sessions  . Hypertension   . Lung cancer ([place]) dx'd 2002  . Lung cancer ([place])   . Lung cancer ([place])   . Lung metastasis ([place])    PET scan 05/05/13, RUL lung nodule  . Metastasis to brain [facility]) dx'd 2008  . 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 Nodule of right lung CT- 06/03/12   RIGHT UPPER LOBE  . Nodule of right lung 06/03/12   Upper Lobe  . On antineoplastic chemotherapy    TARCEVA  . Osteoporosis 2010  . Primary cancer of right upper lobe of lung ([place]) 04/22/2009   Qualifier: Diagnosis of  By: Nils Pyle CMA ([place]), Mearl Latin    . Shortness of breath    hx lung ca   . Status post chemotherapy 2003   CARBOPLATIN/PACLITAXEL /STATUS POST CLINICAL TRAIL OF CELEBREX AND IRESSA AT [facility] FOR 1 YEAR  . Status post radiation therapy 2003   LEFT LUNG  . Status post radiation therapy 11/07/2005   WHOLE BRAIN: DR Larkin [facility]  . Status post radiation therapy 06/02/2008   GAMMA KNIFE OF RESECTED CAVITAY  . Thyroid adenoma    ?  [name] Thyroid cancer ([place]) 10/18/11 bx   adenoid nodules   . Yeast infection     ALLERGIES:  has No Known Allergies.  MEDICATIONS:  Current Outpatient Medications  Medication Sig Dispense Refill  . Ascorbic Acid (VITAMIN C) 100 MG tablet Take 100 mg by mouth every evening. Gummie    . Cholecalciferol (D3 PO) Take 1 capsule by mouth daily.    . diphenhydrAMINE (BENADRYL) 25 MG tablet Take 25 mg by mouth every 6 (six) hours as needed for allergies.    [name] donepezil (ARICEPT) 10 MG tablet Take 10 mg by mouth  at bedtime.    . erlotinib (TARCEVA) 100 MG tablet Take 1 tablet (100 mg total) by mouth daily. Take on an empty stomach 1 hour before meals or 2 hours after 30 tablet 2  . hydrocerin (EUCERIN) CREA Apply 1 application topically 2 (two) times daily. To back and buttocks. 113 g 0  . loperamide (IMODIUM A-D) 2 MG tablet Take 4 mg by mouth in the morning and at bedtime.    [name] MAGNESIUM PO Take 1 tablet by mouth daily.    . metoprolol succinate (TOPROL-XL) 25 MG 24 hr tablet Take 1 tablet (25 mg total) by mouth daily. 90 tablet 3  . Multiple Vitamin (MULTI-VITAMINS) TABS Take 1 tablet by mouth every evening.     . potassium chloride SA (KLOR-CON) 20 MEQ tablet Take 20 mEq by mouth daily.    . prochlorperazine (COMPAZINE) 10 MG tablet Take 1 tablet (10 mg total) by mouth every 6 (six) hours as needed for nausea or vomiting.  30 tablet 0  . vitamin B-12 (CYANOCOBALAMIN) 1000 MCG tablet Take 1,000 mcg by mouth every evening.    [name] 20 MG TABS tablet TAKE 1 TABLET BY MOUTH ONCE DAILY IN THE EVENING 90 tablet 0   No current facility-administered medications for this visit.    SURGICAL HISTORY:  Past Surgical History:  Procedure Laterality Date  . BRAIN SURGERY    . INTRAVASCULAR ULTRASOUND/IVUS N/A 02/16/2020   Procedure: Intravascular Ultrasound/IVUS;  Surgeon: Leonie Man, MD;  Location: [place] [facility];  Service: Cardiovascular;  Laterality: N/A;  . LEFT LOWER LOBECTOMY  05/2001  . LEFT OCCIPITAL CRANIOTOMY  09/21/2005   tumor  . LUNG LOBECTOMY    . NECK SURGERY    . ORIF ANKLE FRACTURE Left 10/02/2014   Procedure: OPEN REDUCTION INTERNAL FIXATION (ORIF) ANKLE FRACTURE;  Surgeon: Alta Corning, MD;  Location: [facility];  Service: Orthopedics;  Laterality: Left;  . ORIF FEMUR FRACTURE Right 06/11/2019   Procedure: OPEN REDUCTION INTERNAL FIXATION (ORIF) DISTAL FEMUR FRACTURE;  Surgeon: Renette Butters, MD;  Location: [place];  Service: Orthopedics;  Laterality: Right;  . ORIF TIBIA  PLATEAU Right 06/11/2019   Procedure: Open Reduction Internal Fixation (Orif) Tibial Plateau;  Surgeon: Renette Butters, MD;  Location: [place];  Service: Orthopedics;  Laterality: Right;  . RADICAL NECK DISSECTION  10/18/2011   Procedure: RADICAL NECK DISSECTION;  Surgeon: Izora Gala, MD;  Location: [place];  Service: ENT;  Laterality: Right;  RIGHT MODIFIED NECK DISSECTION /POSSIBLE RIGHT THYROIDECTOM  . RIGHT/LEFT HEART CATH AND CORONARY ANGIOGRAPHY N/A 02/16/2020   Procedure: RIGHT/LEFT HEART CATH AND CORONARY ANGIOGRAPHY;  Surgeon: Leonie Man, MD;  Location: [place] [facility];  Service: Cardiovascular;  Laterality: N/A;  . TEE WITHOUT CARDIOVERSION N/A 03/28/2020   Procedure: TRANSESOPHAGEAL ECHOCARDIOGRAM (TEE);  Surgeon: Buford Dresser, MD;  Location: [place];  Service: Cardiovascular;  Laterality: N/A;  . THYROIDECTOMY  10/18/2011   Procedure: THYROIDECTOMY WITH RADICAL NECK DISSECTION;  Surgeon: Izora Gala, MD;  Location: [place];  Service: ENT;  Laterality: Right;    REVIEW OF SYSTEMS:  A comprehensive review of systems was negative except for: Genitourinary: positive for dysuria Musculoskeletal: positive for muscle weakness    LABORATORY DATA: Lab Results  Component Value Date   WBC 7.2 08/30/2020   HGB 12.0 08/30/2020   HCT 38.2 08/30/2020   MCV 92.7 08/30/2020   PLT 312 08/30/2020      Chemistry      Component Value Date/Time   NA 139 08/30/2020 1118   NA 140 04/20/2020 0000   NA 141 08/06/2017 1047   K 4.4 08/30/2020 1118   K 3.9 08/06/2017 1047   CL 104 08/30/2020 1118   CL 106 12/09/2012 0806   CO2 26 08/30/2020 1118   CO2 24 08/06/2017 1047   BUN 15 08/30/2020 1118   BUN 18 04/20/2020 0000   BUN 15.4 08/06/2017 1047   CREATININE 1.09 (H) 08/30/2020 1118   CREATININE 1.0 08/06/2017 1047   GLU 89 04/20/2020 0000      Component Value Date/Time   CALCIUM 9.9 08/30/2020 1118   CALCIUM 9.8 08/06/2017 1047   ALKPHOS 116 08/30/2020 1118   ALKPHOS  65 08/06/2017 1047   AST 9 (L) 08/30/2020 1118   AST 11 08/06/2017 1047   ALT 8 08/30/2020 1118   ALT 9 08/06/2017 1047   BILITOT 0.9 08/30/2020 1118   BILITOT 0.66 08/06/2017 1047       RADIOGRAPHIC STUDIES:  CT Chest W Contrast  Result Date: 08/30/2020 CLINICAL DATA:  Non-small-cell lung cancer.  Restaging. EXAM: CT CHEST, ABDOMEN, AND PELVIS WITH CONTRAST TECHNIQUE: Multidetector CT imaging of the chest, abdomen and pelvis was performed following the standard protocol during bolus administration of intravenous contrast. CONTRAST:  161mL OMNIPAQUE IOHEXOL 300 MG/ML  SOLN COMPARISON:  06/03/2020 FINDINGS: CT CHEST FINDINGS Cardiovascular: The heart size is normal. No substantial pericardial effusion. Atherosclerotic calcification is noted in the wall of the thoracic aorta. Mediastinum/Nodes: No mediastinal lymphadenopathy. There is no hilar lymphadenopathy. Fluid in the proximal esophagus may be related to dysmotility or reflux. Lungs/Pleura: Volume loss left hemithorax compatible with prior left lower lobectomy.Small left pleural effusion is similar to prior. Soft tissue attenuation anterior right hilum 153/6 is stable. Stable 4 mm right upper lobe nodule on 52/6. Subsegmental atelectasis or linear scarring in the right base is unchanged. Right lower lobe granuloma. Architectural distortion and scarring in the left parahilar lung is stable. 5 mm ground-glass opacity anterior left lung on 48/6 is unchanged. Musculoskeletal: Bones are diffusely demineralized. No worrisome lytic or sclerotic osseous abnormality. CT ABDOMEN PELVIS FINDINGS Hepatobiliary: Tiny hypodensities inferior right liver are stable consistent with benign etiology. Stable narrowing/stricture in the mid gallbladder compared to prior study and also back to 11/23/2019. Mild intra and extrahepatic biliary duct dilatation is stable to mildly increased. Common bile duct is 11 mm diameter in the head of pancreas. Pancreas: Main duct  dilatation again noted, most prominent in the head of pancreas where it measures 5 mm today compared to 4-5 mm on the prior study. No pancreatic head or ampullary lesion evident by CT imaging. Spleen: No splenomegaly. No focal mass lesion. Adrenals/Urinary Tract: No adrenal nodule or mass. Right kidney unremarkable. Duplicated left intrarenal collecting system noted with new mild hydronephrosis involving both renal moieties. Filling defects are seen in the upper pole collecting system and pelvis to the lower pole moiety. There is also nonobstructing filling defect in the proximal ureter of the upper pole moiety (see 17/7). These findings are new in the interval. Confluence of the duplicated left ureter identified in the pelvis, just proximal to the crossing iliac vessels. The confluent single distal ureter remains mildly dilated into the UVJ. Irregular circumferential bladder wall thickening evident. Stomach/Bowel: Stomach is unremarkable. No gastric wall thickening. No evidence of outlet obstruction. Duodenum is normally positioned as is the ligament of Treitz. No small bowel wall thickening. No small bowel dilatation. The terminal ileum is normal. The appendix is normal. No gross colonic mass. No colonic wall thickening. Vascular/Lymphatic: There is abdominal aortic atherosclerosis without aneurysm. There is no gastrohepatic or hepatoduodenal ligament lymphadenopathy. No retroperitoneal or mesenteric lymphadenopathy. No pelvic sidewall lymphadenopathy. Reproductive: Fibroids noted in the uterus. There is no adnexal mass. Other: No intraperitoneal free fluid. Musculoskeletal: No worrisome lytic or sclerotic osseous abnormality. Compression fractures noted at T10, L1, L4, and L5. IMPRESSION: 1. Stable CT chest exam. Status post left lower lobectomy with stable appearance of volume loss left hemithorax and small left pleural effusion. No new or progressive findings to suggest recurrent or metastatic disease in the  chest, abdomen, or pelvis. 2. Stable appearance of small bilateral pulmonary nodules. Attention on follow-up recommended. 3. 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 Nonocclusive filling defects in the lower pole collecting system and renal pelvis associated with nonobstructing filling defects in the proximal ureter of the upper pole moiety. These findings are new in the interval. Imaging features may be related to infectious  debris or hemorrhage. Underlying urothelial neoplasm cannot be excluded. 4. Irregular circumferential bladder wall thickening. Duplicated left ureter joins just proximal to the crossing iliac vessels and remains dilated to the UVJ. 5. Stable to slight increase in intra and extrahepatic biliary duct dilatation with persistent dilatation of the main pancreatic duct. No pancreatic head or ampullary lesion evident by CT. Correlation with liver function test may prove helpful. 6. Stable appearance of narrowing/stricture in the mid gallbladder without obstruction. 7. Compression fractures at T10, L1, L4, and L5. 8. Aortic Atherosclerosis (ICD10-I70.0). Electronically Signed   By: Misty Stanley M.D.   On: 08/30/2020 14:03   CT Abdomen Pelvis W Contrast  Result Date: 08/30/2020 CLINICAL DATA:  Non-small-cell lung cancer.  Restaging. EXAM: CT CHEST, ABDOMEN, AND PELVIS WITH CONTRAST TECHNIQUE: Multidetector CT imaging of the chest, abdomen and pelvis was performed following the standard protocol during bolus administration of intravenous contrast. CONTRAST:  152mL OMNIPAQUE IOHEXOL 300 MG/ML  SOLN COMPARISON:  06/03/2020 FINDINGS: CT CHEST FINDINGS Cardiovascular: The heart size is normal. No substantial pericardial effusion. Atherosclerotic calcification is noted in the wall of the thoracic aorta. Mediastinum/Nodes: No mediastinal lymphadenopathy. There is no hilar lymphadenopathy. Fluid in the proximal esophagus may be related to dysmotility or reflux.  Lungs/Pleura: Volume loss left hemithorax compatible with prior left lower lobectomy.Small left pleural effusion is similar to prior. Soft tissue attenuation anterior right hilum 153/6 is stable. Stable 4 mm right upper lobe nodule on 52/6. Subsegmental atelectasis or linear scarring in the right base is unchanged. Right lower lobe granuloma. Architectural distortion and scarring in the left parahilar lung is stable. 5 mm ground-glass opacity anterior left lung on 48/6 is unchanged. Musculoskeletal: Bones are diffusely demineralized. No worrisome lytic or sclerotic osseous abnormality. CT ABDOMEN PELVIS FINDINGS Hepatobiliary: Tiny hypodensities inferior right liver are stable consistent with benign etiology. Stable narrowing/stricture in the mid gallbladder compared to prior study and also back to 11/23/2019. Mild intra and extrahepatic biliary duct dilatation is stable to mildly increased. Common bile duct is 11 mm diameter in the head of pancreas. Pancreas: Main duct dilatation again noted, most prominent in the head of pancreas where it measures 5 mm today compared to 4-5 mm on the prior study. No pancreatic head or ampullary lesion evident by CT imaging. Spleen: No splenomegaly. No focal mass lesion. Adrenals/Urinary Tract: No adrenal nodule or mass. Right kidney unremarkable. Duplicated left intrarenal collecting system noted with new mild hydronephrosis involving both renal moieties. Filling defects are seen in the upper pole collecting system and pelvis to the lower pole moiety. There is also nonobstructing filling defect in the proximal ureter of the upper pole moiety (see 17/7). These findings are new in the interval. Confluence of the duplicated left ureter identified in the pelvis, just proximal to the crossing iliac vessels. The confluent single distal ureter remains mildly dilated into the UVJ. Irregular circumferential bladder wall thickening evident. Stomach/Bowel: Stomach is unremarkable. No gastric  wall thickening. No evidence of outlet obstruction. Duodenum is normally positioned as is the ligament of Treitz. No small bowel wall thickening. No small bowel dilatation. The terminal ileum is normal. The appendix is normal. No gross colonic mass. No colonic wall thickening. Vascular/Lymphatic: There is abdominal aortic atherosclerosis without aneurysm. There is no gastrohepatic or hepatoduodenal ligament lymphadenopathy. No retroperitoneal or mesenteric lymphadenopathy. No pelvic sidewall lymphadenopathy. Reproductive: Fibroids noted in the uterus. There is no adnexal mass. Other: No intraperitoneal free fluid. Musculoskeletal: No worrisome lytic or sclerotic osseous abnormality. Compression fractures noted  at T10, L1, L4, and L5. IMPRESSION: 1. Stable CT chest exam. Status post left lower lobectomy with stable appearance of volume loss left hemithorax and small left pleural effusion. No new or progressive findings to suggest recurrent or metastatic disease in the chest, abdomen, or pelvis. 2. Stable appearance of small bilateral pulmonary nodules. Attention on follow-up recommended. 3. 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 Nonocclusive filling defects in the lower pole collecting system and renal pelvis associated with nonobstructing filling defects in the proximal ureter of the upper pole moiety. These findings are new in the interval. Imaging features may be related to infectious debris or hemorrhage. Underlying urothelial neoplasm cannot be excluded. 4. Irregular circumferential bladder wall thickening. Duplicated left ureter joins just proximal to the crossing iliac vessels and remains dilated to the UVJ. 5. Stable to slight increase in intra and extrahepatic biliary duct dilatation with persistent dilatation of the main pancreatic duct. No pancreatic head or ampullary lesion evident by CT. Correlation with liver function test may prove helpful. 6. Stable  appearance of narrowing/stricture in the mid gallbladder without obstruction. 7. Compression fractures at T10, L1, L4, and L5. 8. Aortic Atherosclerosis (ICD10-I70.0). Electronically Signed   By: Misty Stanley M.D.   On: 08/30/2020 14:03    ASSESSMENT AND PLAN: This is a very pleasant 75 years old African-American female with metastatic non-small cell lung cancer, adenocarcinoma with positive EGFR mutation status post several years of treatment with Tarceva and the patient developed positive resistant mutation, T790M. She has been on treatment with Tagrisso for the last 5 years, status post 62 months of treatment.  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  This is likely secondary to her treatment with Tagrisso. I discontinued her treatment with Tagrisso on 02/13/2020. The patient is currently on treatment with Tarceva 150 mg p.o. daily started 4 months ago with some interruption because of her hospitalization. The patient resumed her treatment with Tarceva at a dose of 100 mg p.o. daily status post 2 months of treatment.  She has been tolerating the reduced dose of treatment with Tarceva fairly well. She had repeat CT scan of the chest, abdomen pelvis performed recently.  I personally and independently reviewed the scans and discussed the results with the patient and her husband. Her scan showed no concerning findings for disease progression. I recommended for the patient to continue her current treatment with Tarceva with the same dose. For the chronic UTI, she is followed by urology and currently on treatment with amoxicillin. The patient was advised to call immediately if she has any other concerning symptoms in the interval. I discussed the assessment and treatment plan with the patient. The  patient was provided an opportunity to ask questions and all were answered. The patient agreed with the plan and demonstrated an understanding of the instructions.   The patient was advised to call back or seek an in-person evaluation if the symptoms worsen or if the condition fails to improve as anticipated.  The total time spent in the appointment was 25 minutes.  Eilleen Kempf, MD 09/05/2020 12:54 PM  Disclaimer: This note was dictated with voice recognition software. Similar sounding words can inadvertently be transcribed and may not be corrected upon review.

## 2020-09-08 ENCOUNTER — Other Ambulatory Visit: Payer: Self-pay

## 2020-09-08 ENCOUNTER — Encounter: Payer: Self-pay | Admitting: Physical Therapy

## 2020-09-08 ENCOUNTER — Ambulatory Visit: Payer: Medicare PPO | Admitting: Physical Therapy

## 2020-09-08 DIAGNOSIS — R293 Abnormal posture: Secondary | ICD-10-CM

## 2020-09-08 DIAGNOSIS — M6281 Muscle weakness (generalized): Secondary | ICD-10-CM

## 2020-09-08 DIAGNOSIS — R2689 Other abnormalities of gait and mobility: Secondary | ICD-10-CM | POA: Diagnosis not present

## 2020-09-08 IMAGING — CR DG HIP (WITH OR WITHOUT PELVIS) 2-3V*L*
3 series · 3 of 3 positions shown · non-contrast
Comparison: None.

CLINICAL DATA: Fall in bedroom today. Left hip pain. Initial
encounter.

EXAM:
DG HIP (WITH OR WITHOUT PELVIS) 2-3V LEFT

[t pelvis ap]
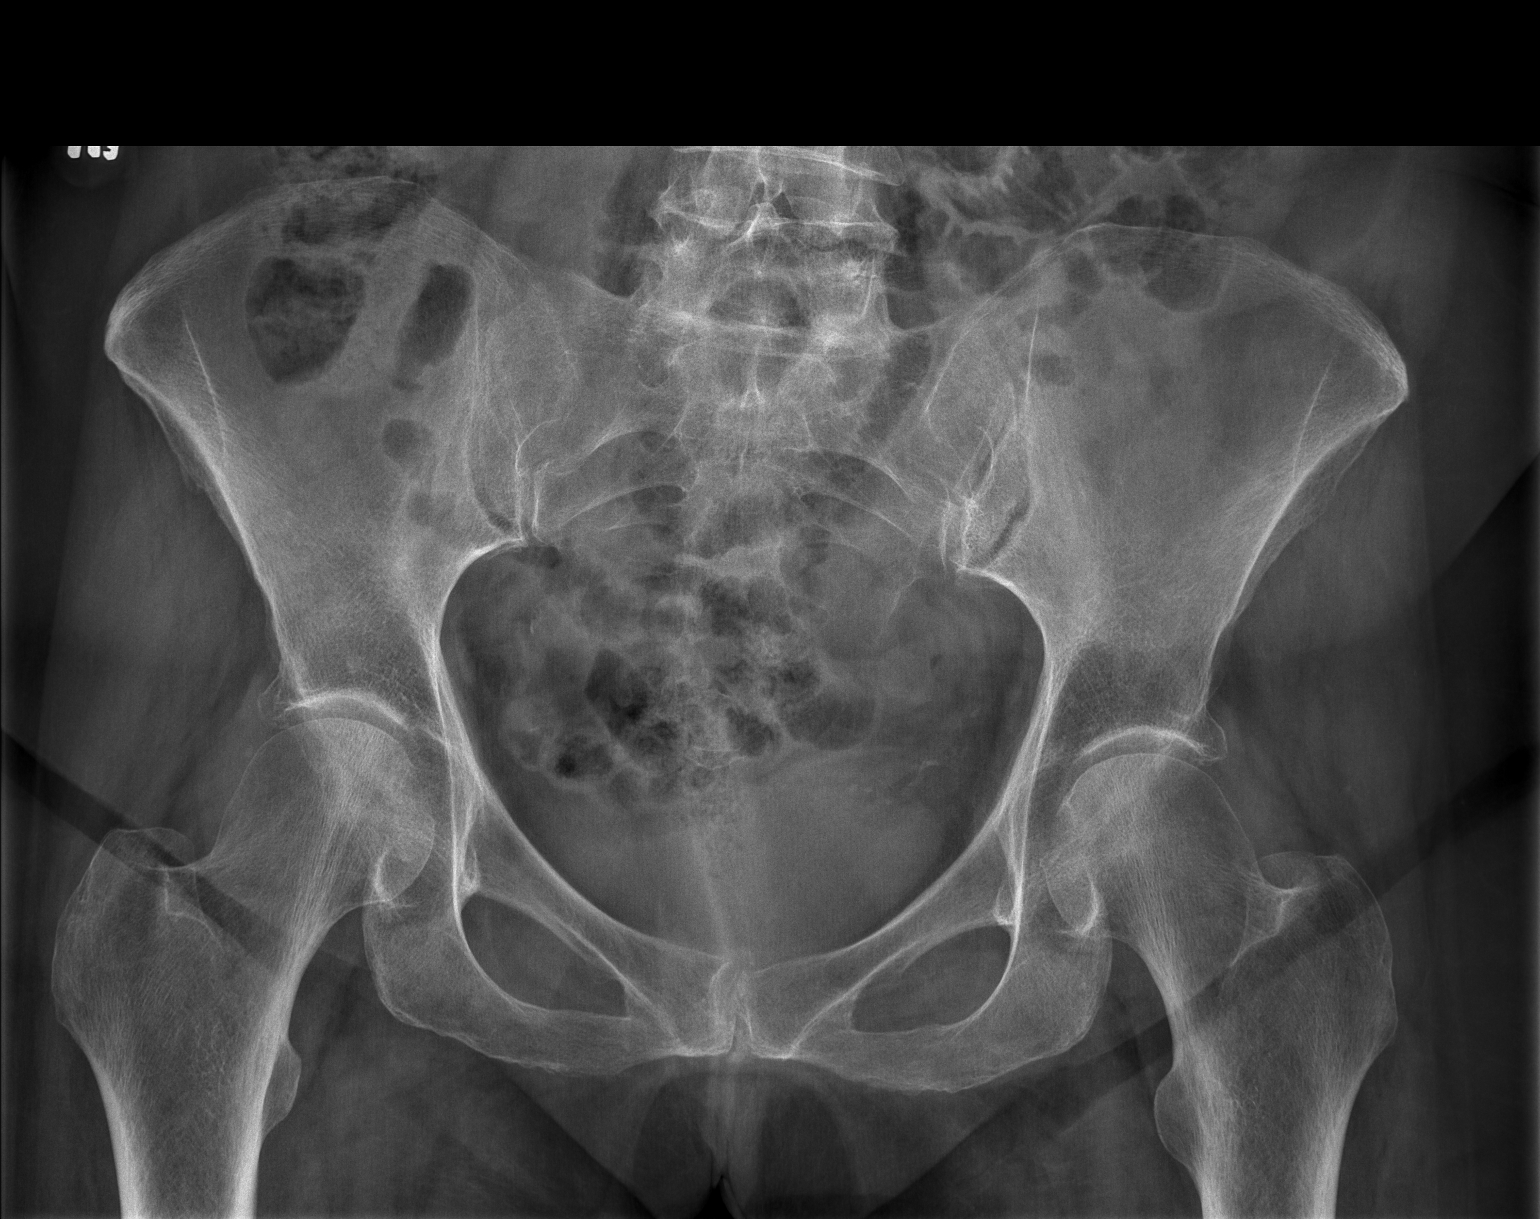

[t hip ap left]
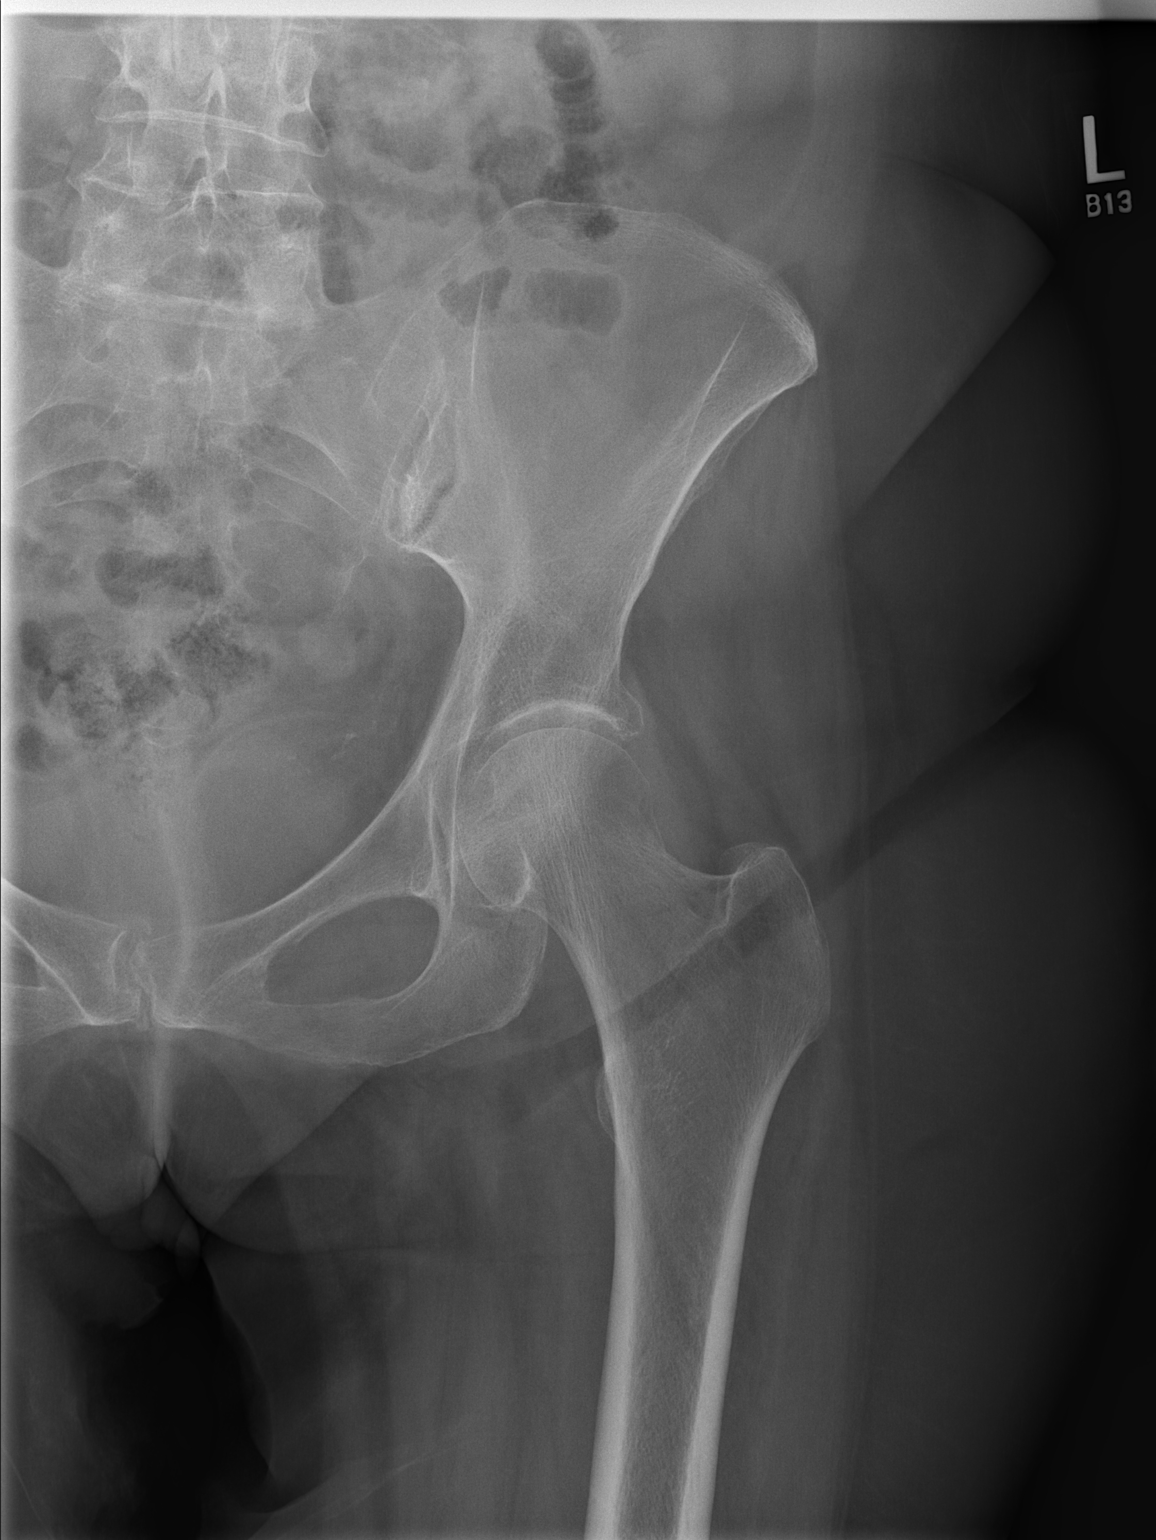

[t hip frog leg left]
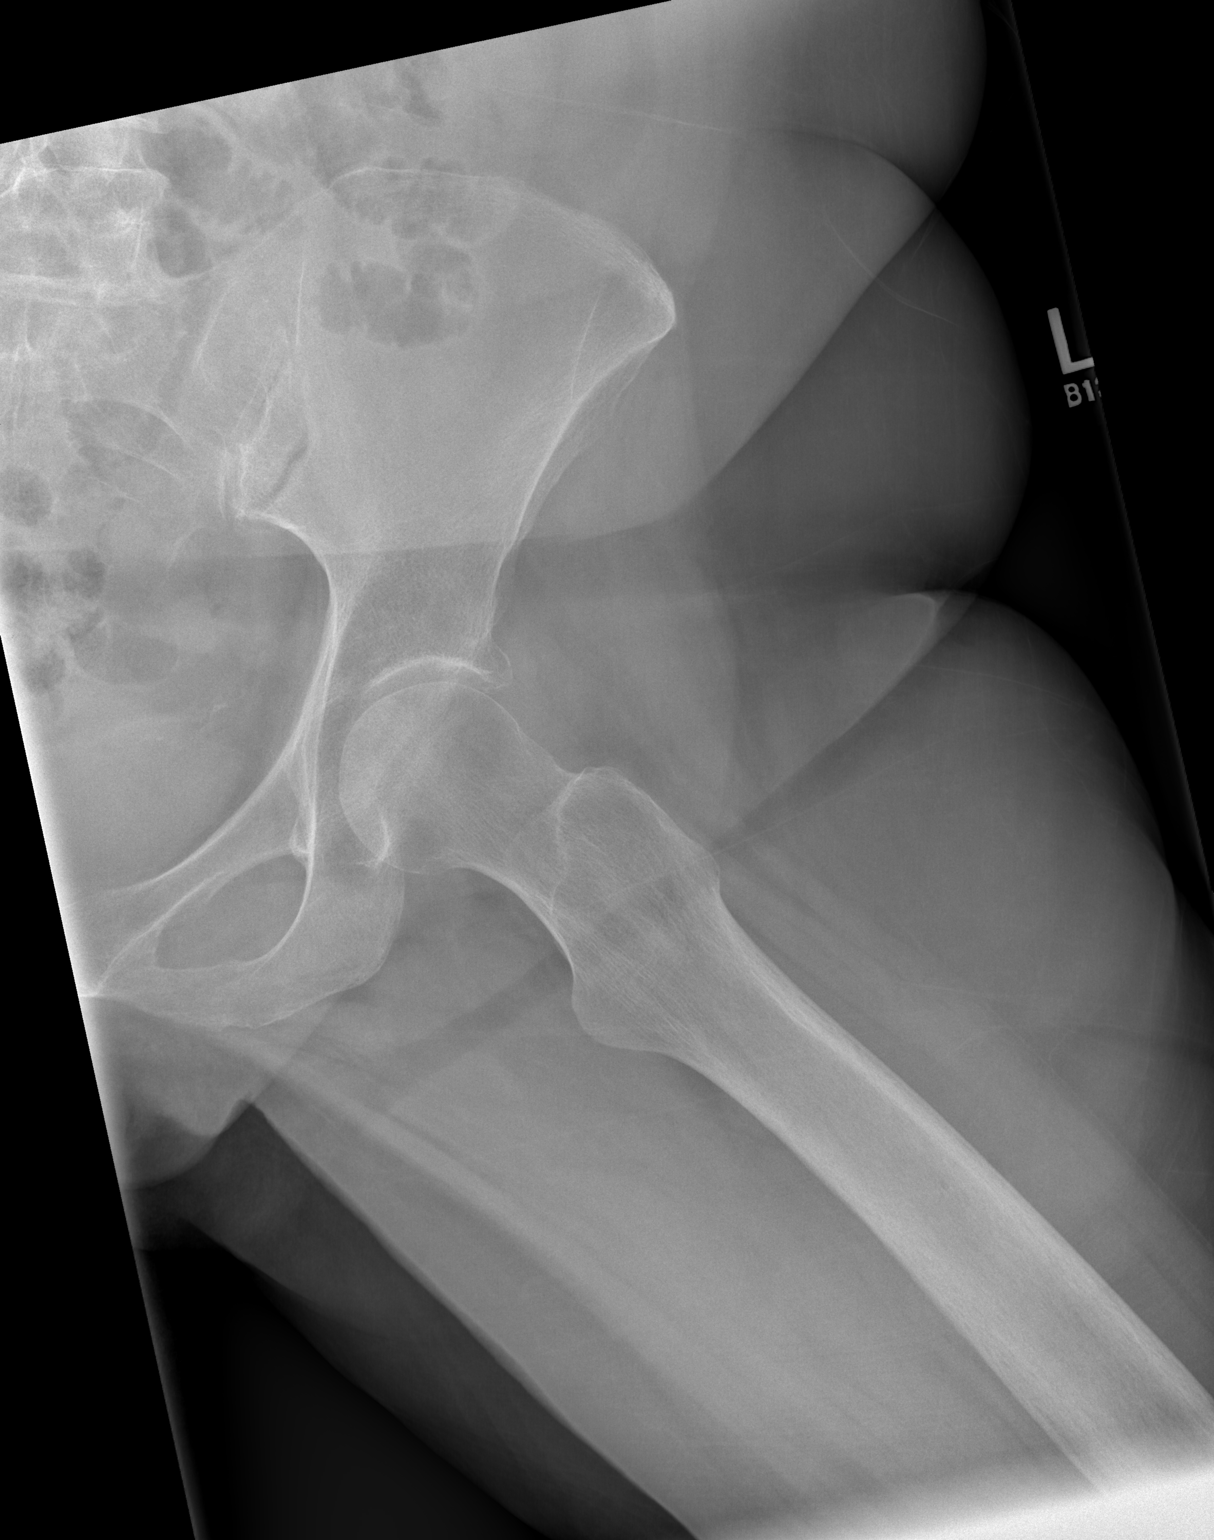

[3 of 3 positions shown; findings below may reference images not displayed]

FINDINGS: There is no evidence of hip fracture or dislocation. Mild
degenerative spurring of left acetabulum seen without joint space
narrowing.
IMPRESSION: No acute findings.

## 2020-09-08 IMAGING — CR DG SHOULDER 2+V*L*
2 series · 2 of 2 positions shown · non-contrast
Comparison: None.

CLINICAL DATA: Is fall in bedroom today. Left shoulder pain.
Initial encounter.

EXAM:
LEFT SHOULDER - 2+ VIEW

[t shoulder external left]
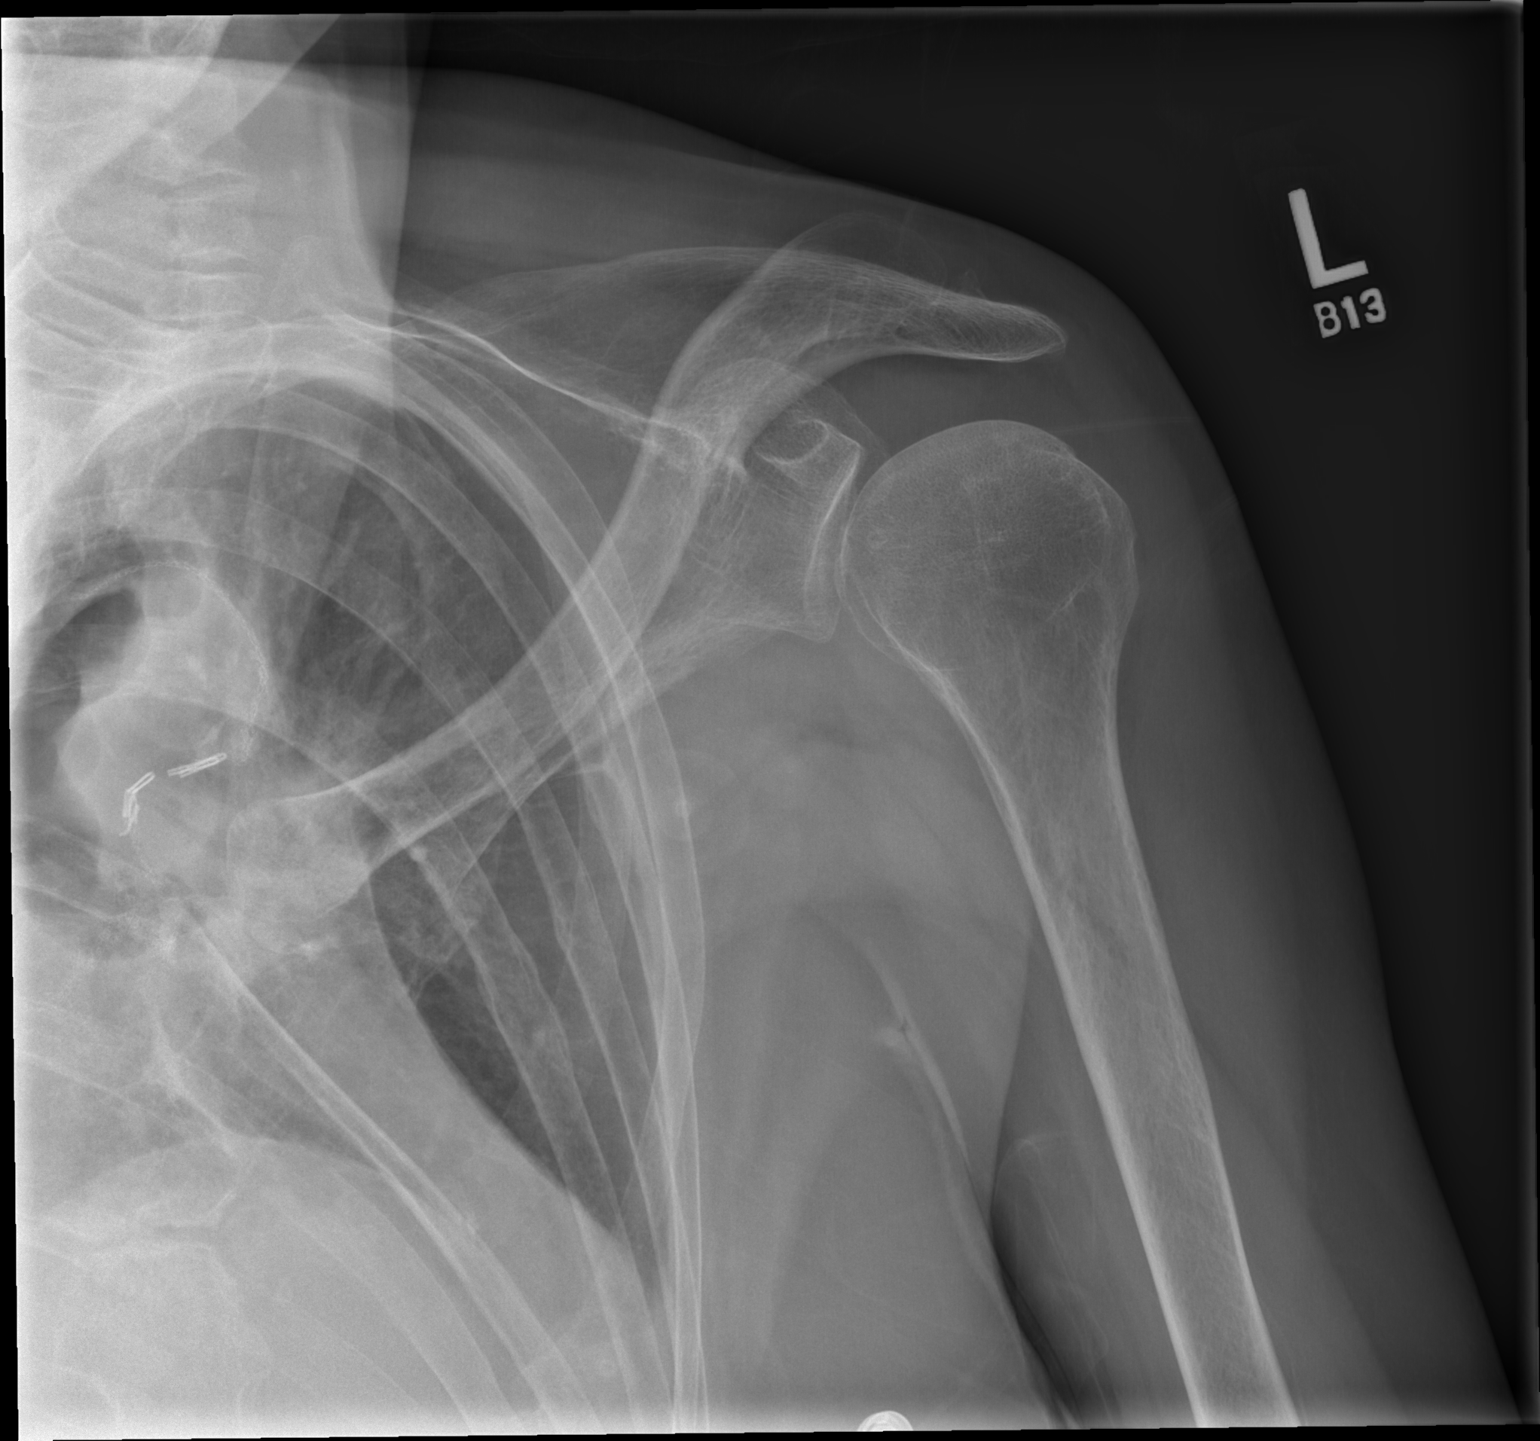

[t scapula y-view left]
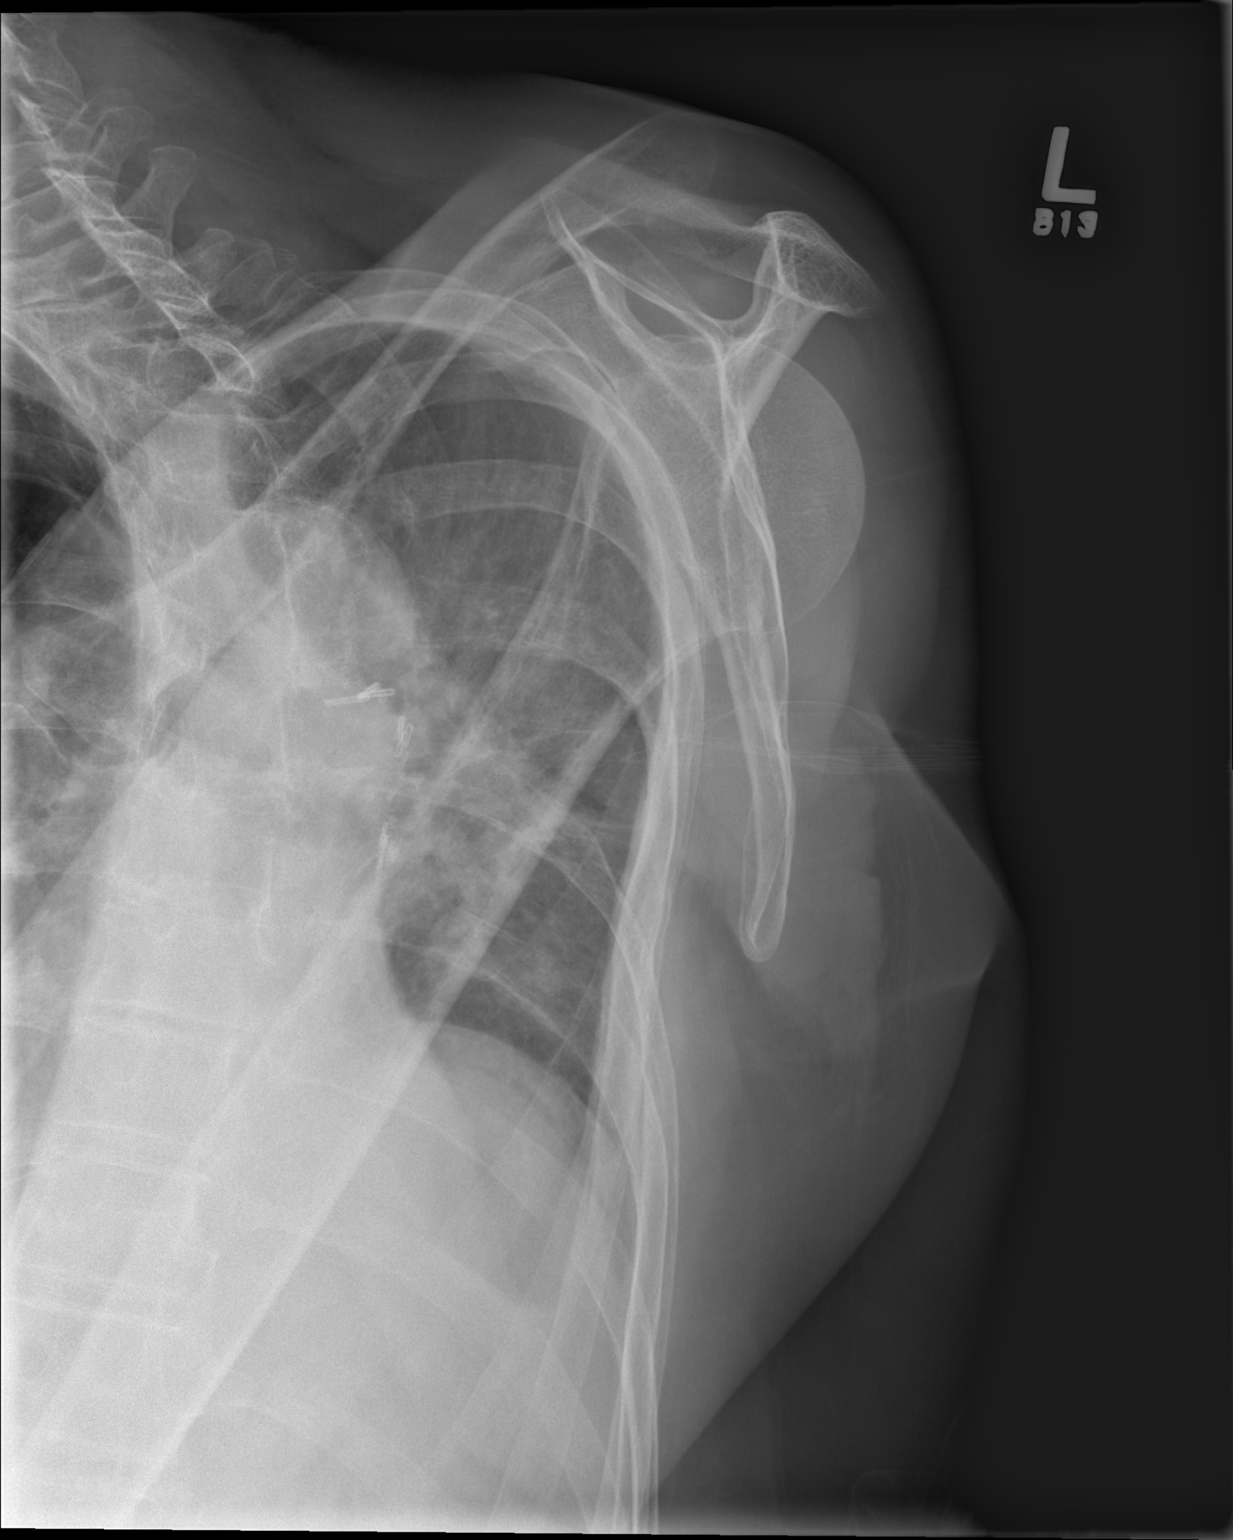

[2 of 2 positions shown; findings below may reference images not displayed]

FINDINGS: There is no evidence of fracture or dislocation. There is no
evidence of arthropathy or other focal bone abnormality. Generalized
osteopenia noted. Multiple old left rib fracture deformities noted.
IMPRESSION: No acute findings.

## 2020-09-08 IMAGING — CT CT HEAD W/O CM
3 series · 15 of 47 positions shown, 18 images · non-contrast
Comparison: MRI 10/01/2017 and multiple previous neck CT studies
and brain MR 06/08/2015

CLINICAL DATA: Generalized weakness. Fell today. Found on the
ground.

EXAM:
CT HEAD WITHOUT CONTRAST
TECHNIQUE: Contiguous axial images were obtained from the base of the skull
through the vertex without intravenous contrast.

[Series 2: head wo · axial · 0.43mm/px · z∈[-174,-49]mm · 9 of 31 slices shown, 12 images]
[im 3/31  brain]
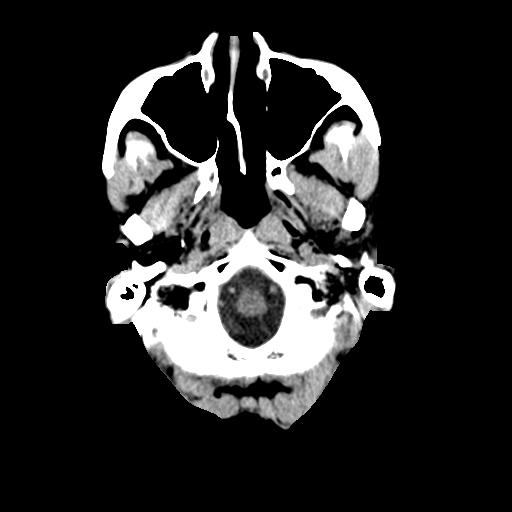
[im 3/31  bone]
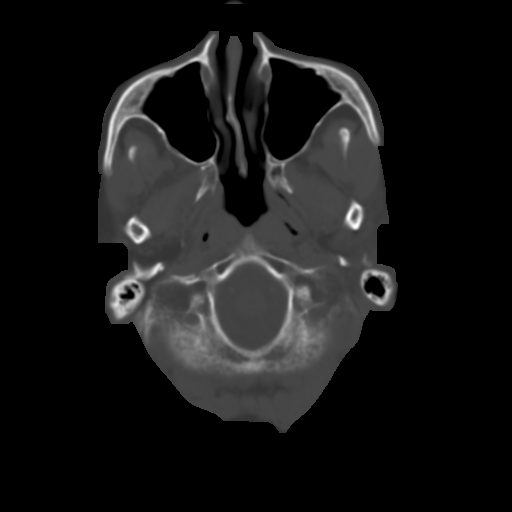
[im 6/31  brain]
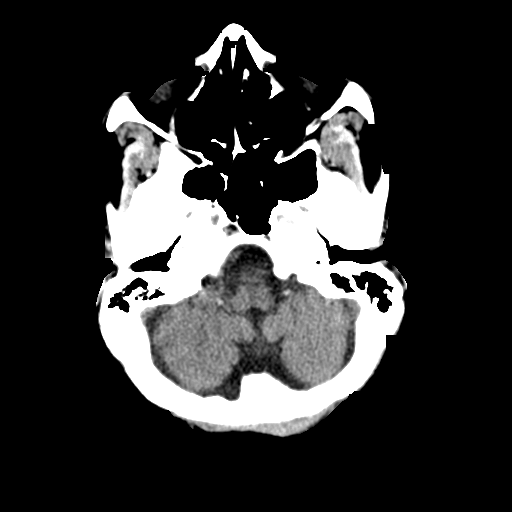
[im 9/31  brain]
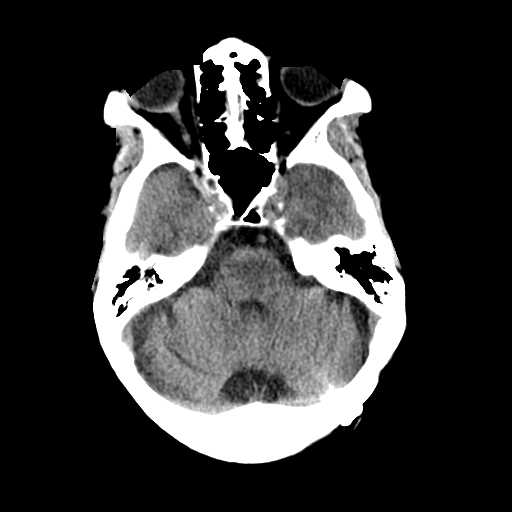
[im 12/31  brain]
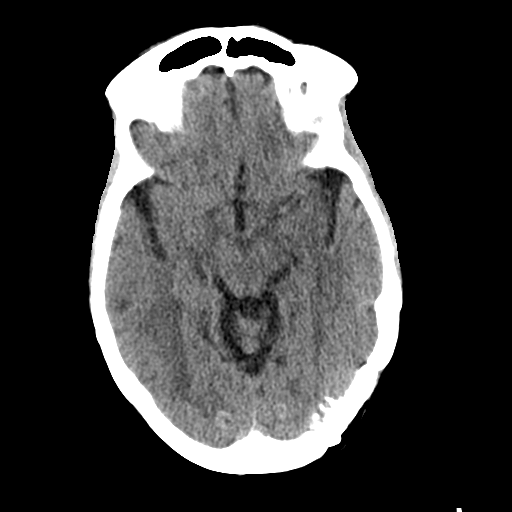
[im 16/31  brain]
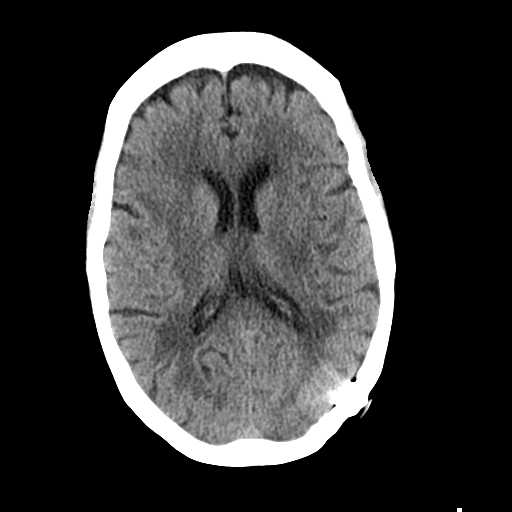
[im 16/31  bone]
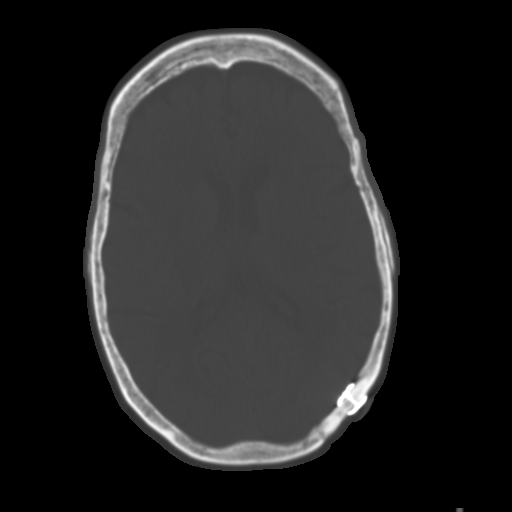
[im 19/31  brain]
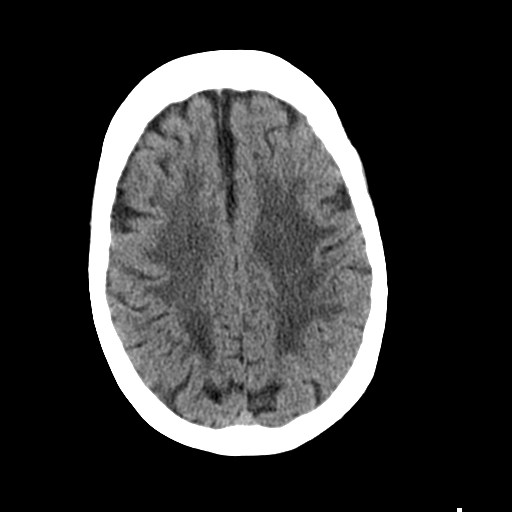
[im 22/31  brain]
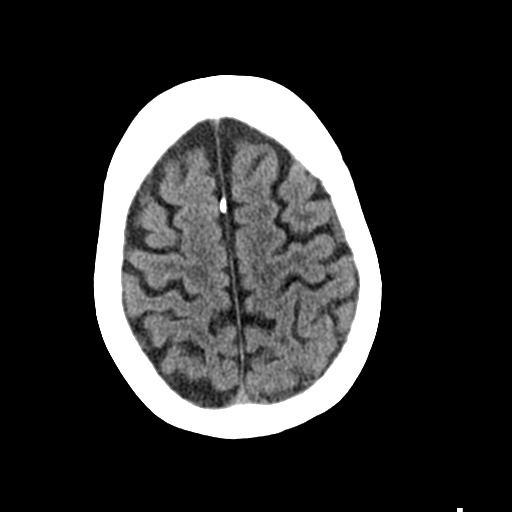
[im 25/31  brain]
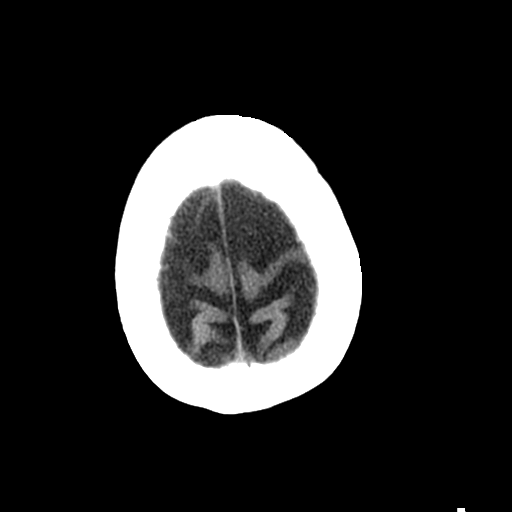
[im 28/31  brain]
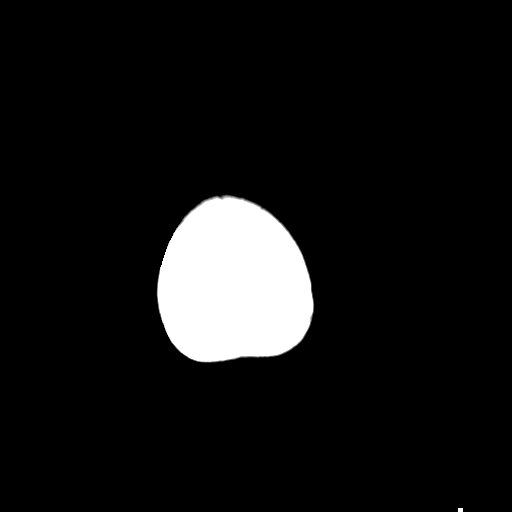
[im 28/31  bone]
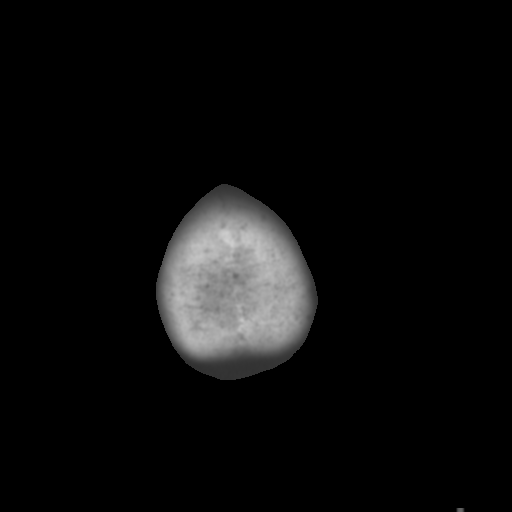

[Series 5: coronal soft tissue · coronal · 0.30mm/px · 3 of 69 slices shown]
[im 23/69  brain]
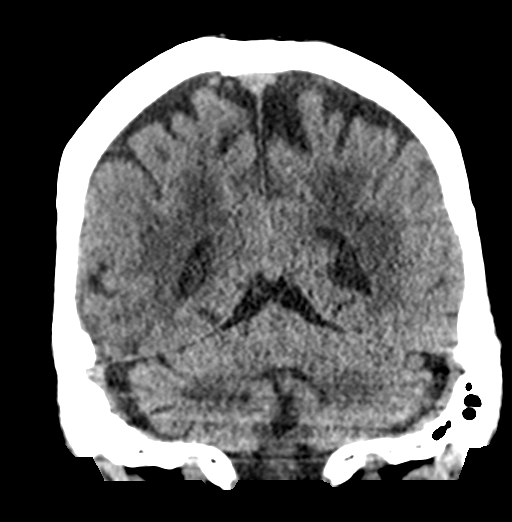
[im 31/69  brain]
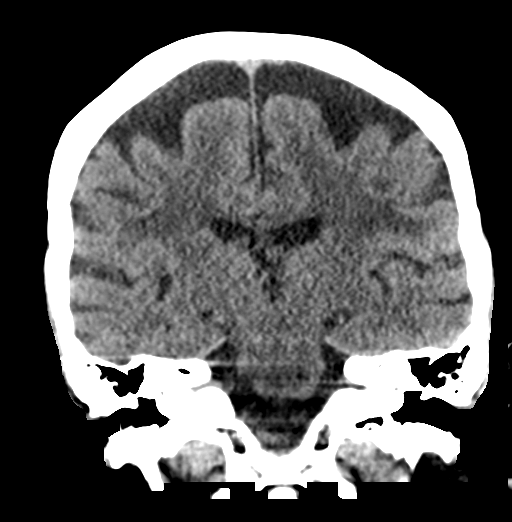
[im 38/69  brain]
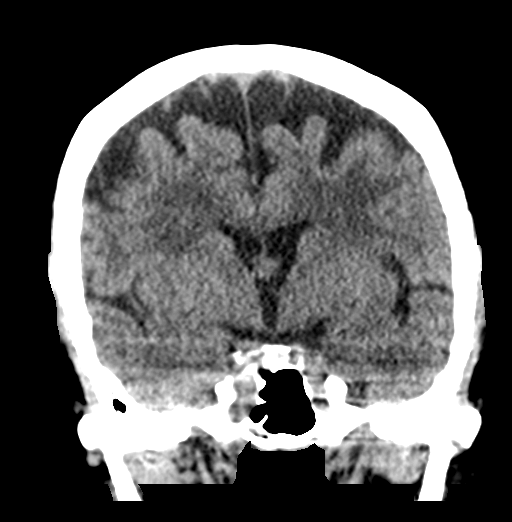

[Series 6: sagittal soft tissue · sagittal · 0.32mm/px · 3 of 52 slices shown]
[im 18/52  brain]
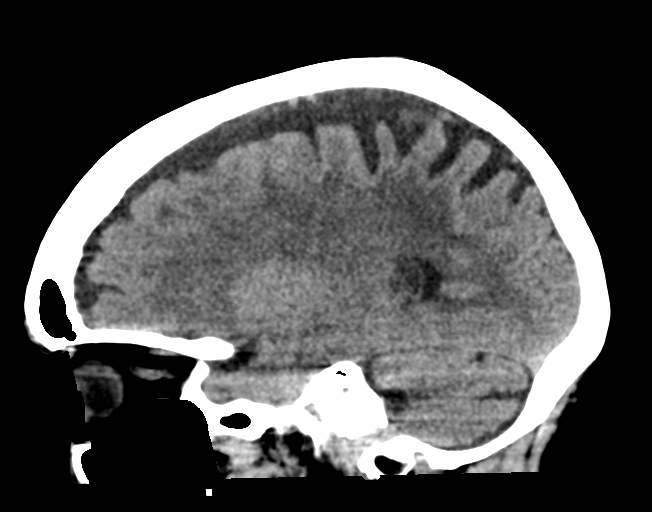
[im 26/52  brain]
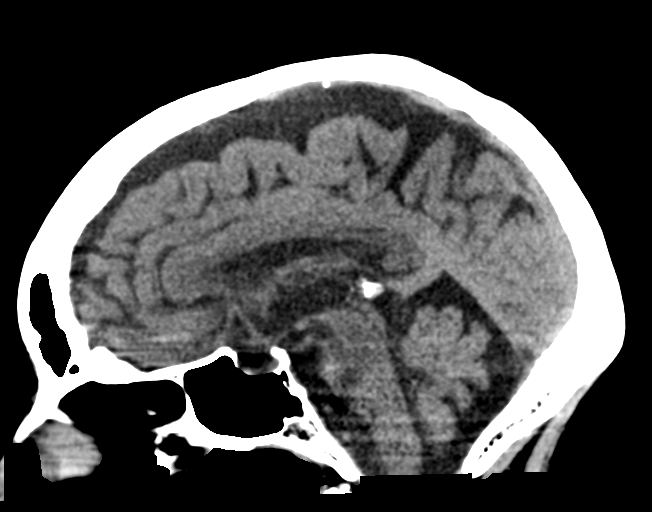
[im 35/52  brain]
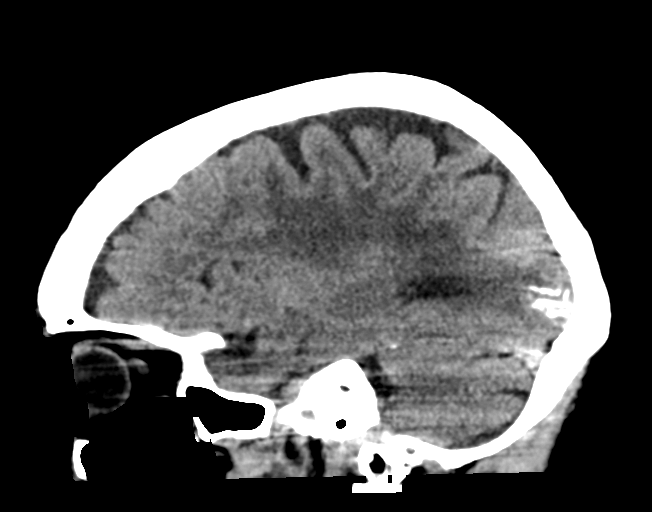

[15 of 47 positions shown; findings below may reference images not displayed]

FINDINGS: Brain: Generalized atrophy. Chronic small-vessel ischemic changes
throughout the cerebral hemispheric white matter. There is
mineralization along the gyri of the occipital lobes more extensive
on the left than the right, consistent with post radiation
mineralization. There is no sign of acute infarction, mass lesion,
hemorrhage, hydrocephalus or extra-axial collection.

Vascular: There is atherosclerotic calcification of the major
vessels at the base of the brain.

Skull: Previous left occipital craniotomy.

Sinuses/Orbits: Clear/normal

Other: None
IMPRESSION: No acute finding by CT. Extensive chronic atrophy and chronic
small-vessel ischemic changes throughout the cerebral hemispheric
white matter. Old left occipital craniotomy. Mineralization of the
occipital lobe gyri, more extensive on the left than the right,
likely secondary to previous radiation.

## 2020-09-08 IMAGING — CR DG TIBIA/FIBULA 2V*L*
3 series · 3 of 3 positions shown · non-contrast
Comparison: None.

CLINICAL DATA: Pain post fall.

EXAM:
LEFT TIBIA AND FIBULA - 2 VIEW

[x tib-fib ap left]
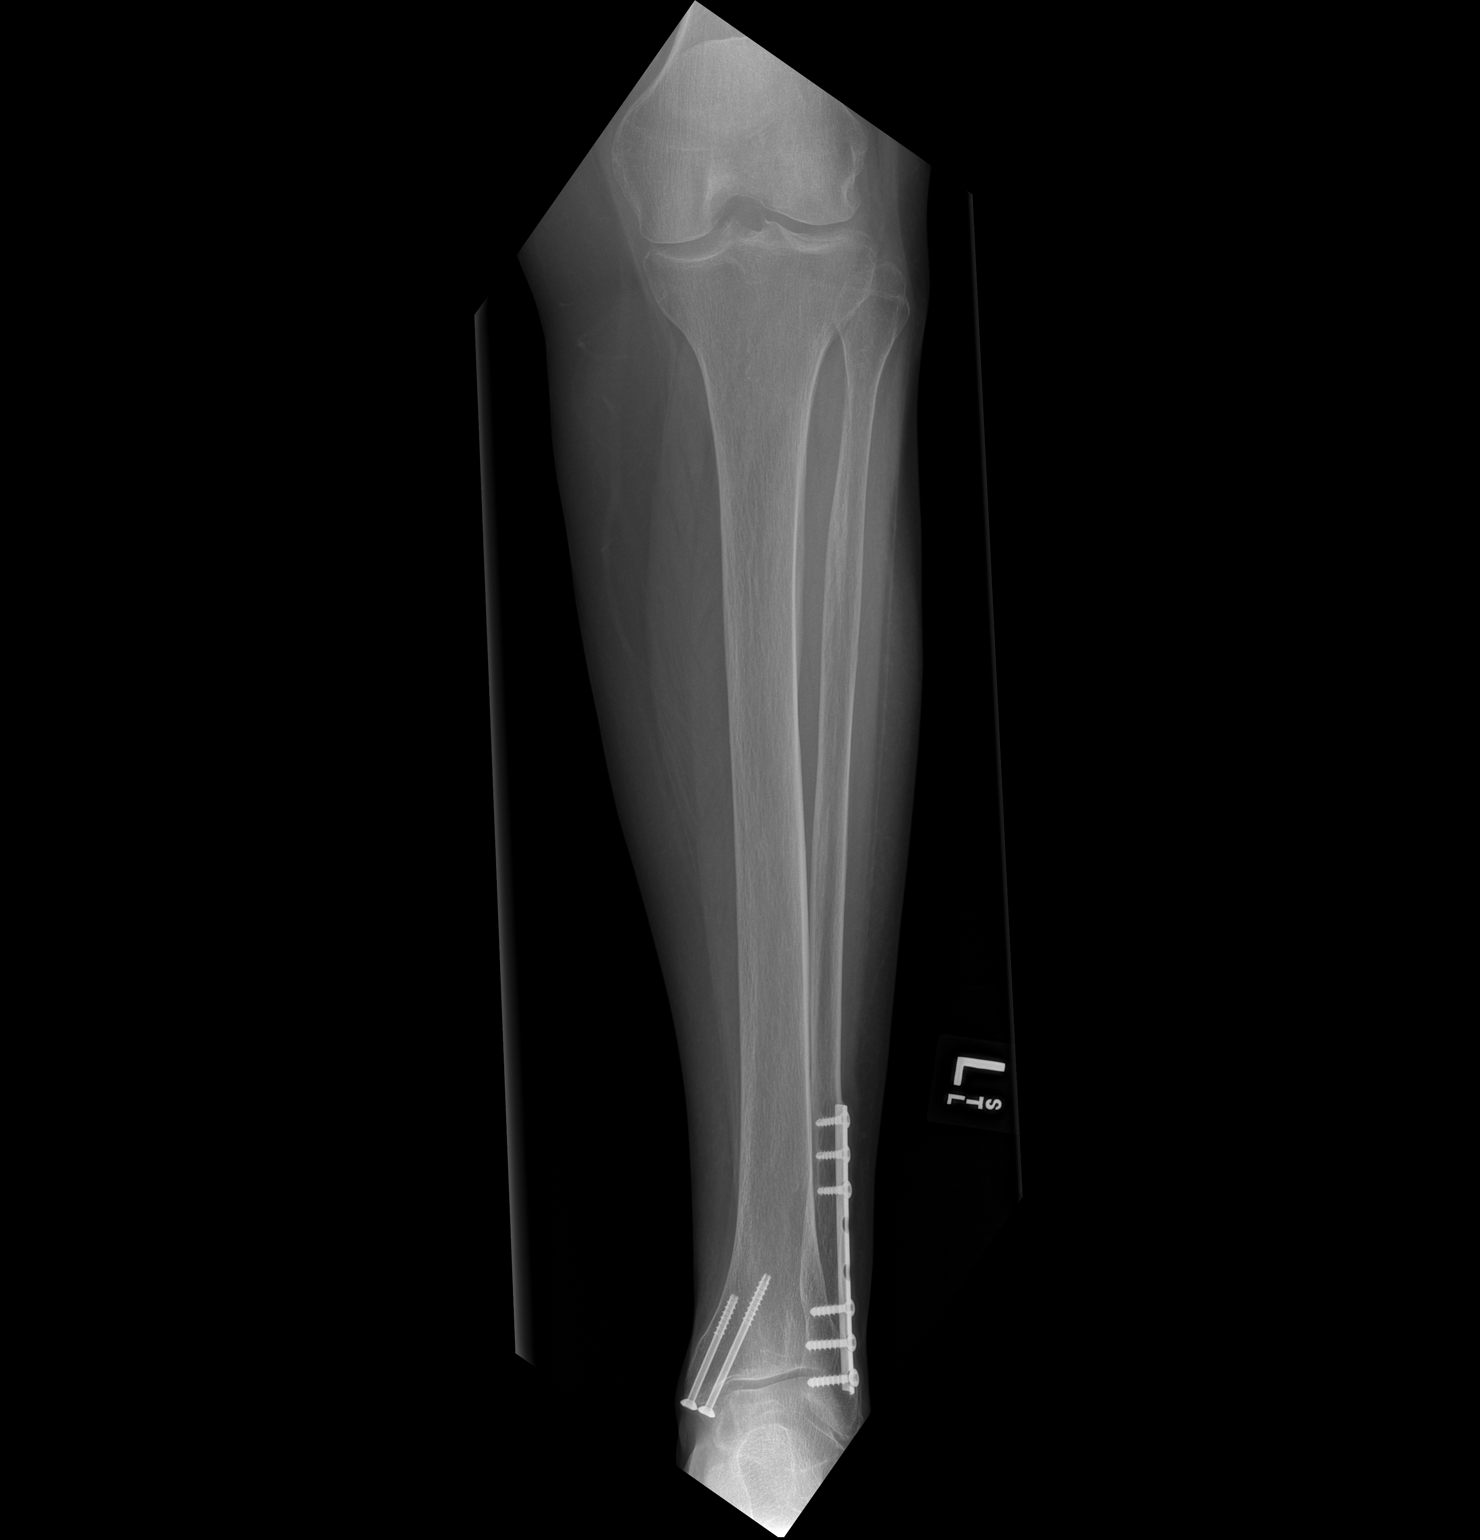

[x tib-fib lat left (1 of 2)]
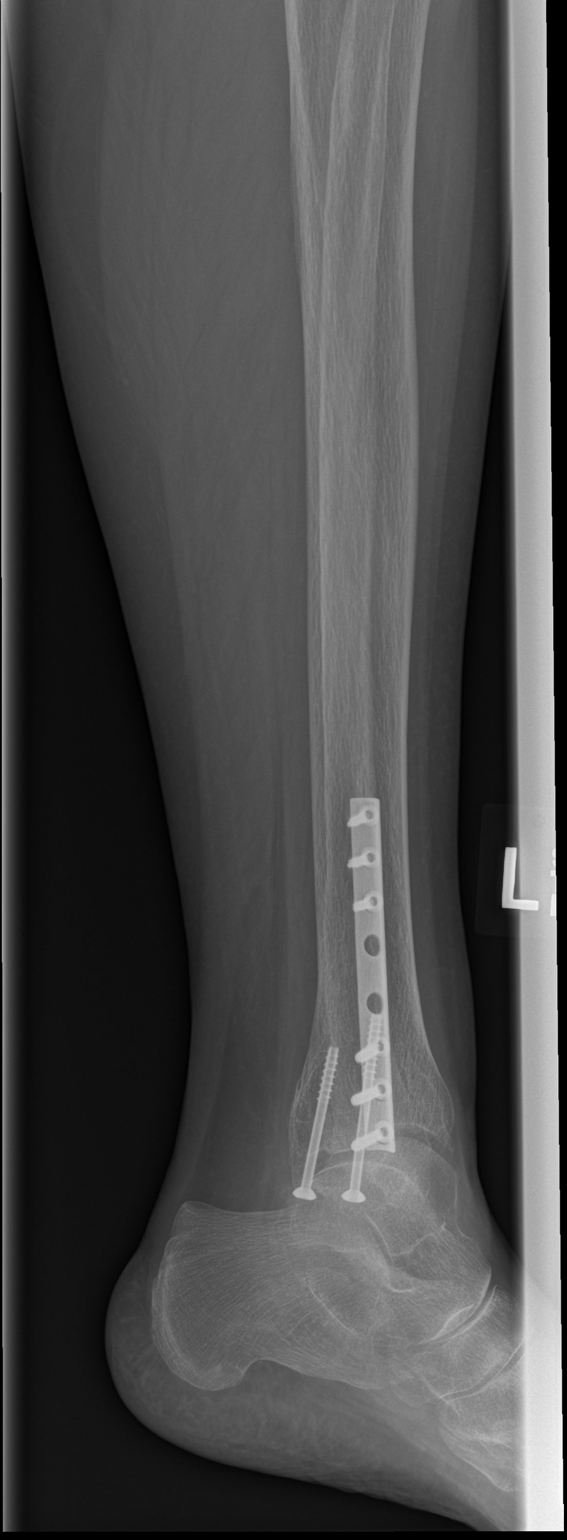

[x tib-fib lat left (2 of 2)]
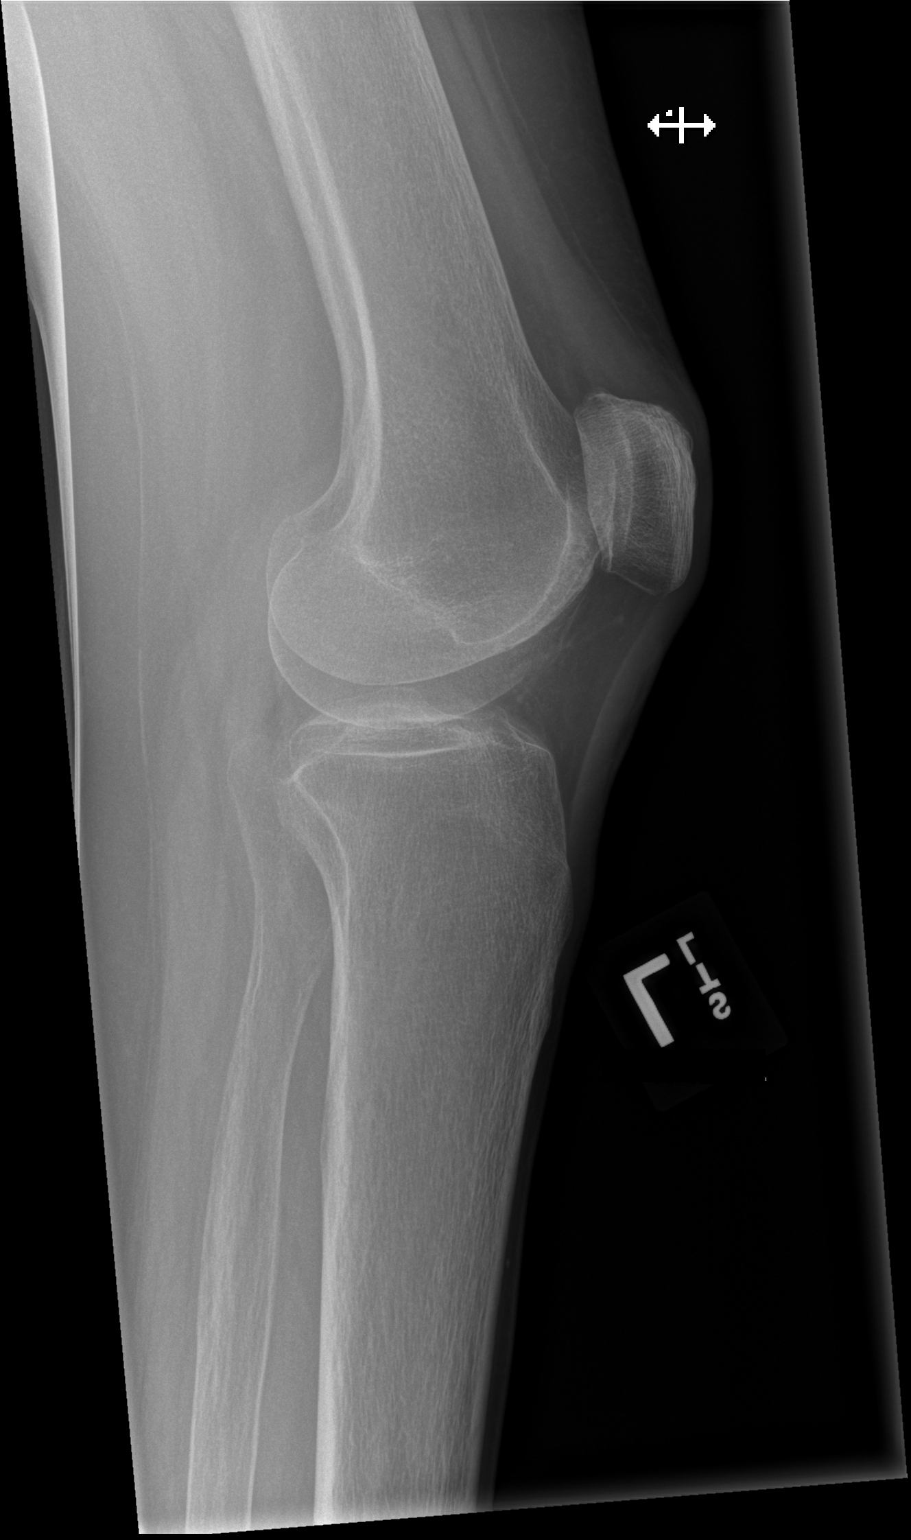

[3 of 3 positions shown; findings below may reference images not displayed]

FINDINGS: There is no evidence of fracture or other focal bone lesions. Prior
bimalleolar fixation without evidence of hardware failure. Soft
tissues are unremarkable.
IMPRESSION: No acute fracture or dislocation identified about the left tibia and
fibula.

## 2020-09-08 IMAGING — CR DG CHEST 2V
2 series · 2 of 2 positions shown · non-contrast
Comparison: 08/24/2015

CLINICAL DATA: Fall in bedroom today. Lung carcinoma.

EXAM:
CHEST - 2 VIEW

[w chest lat]
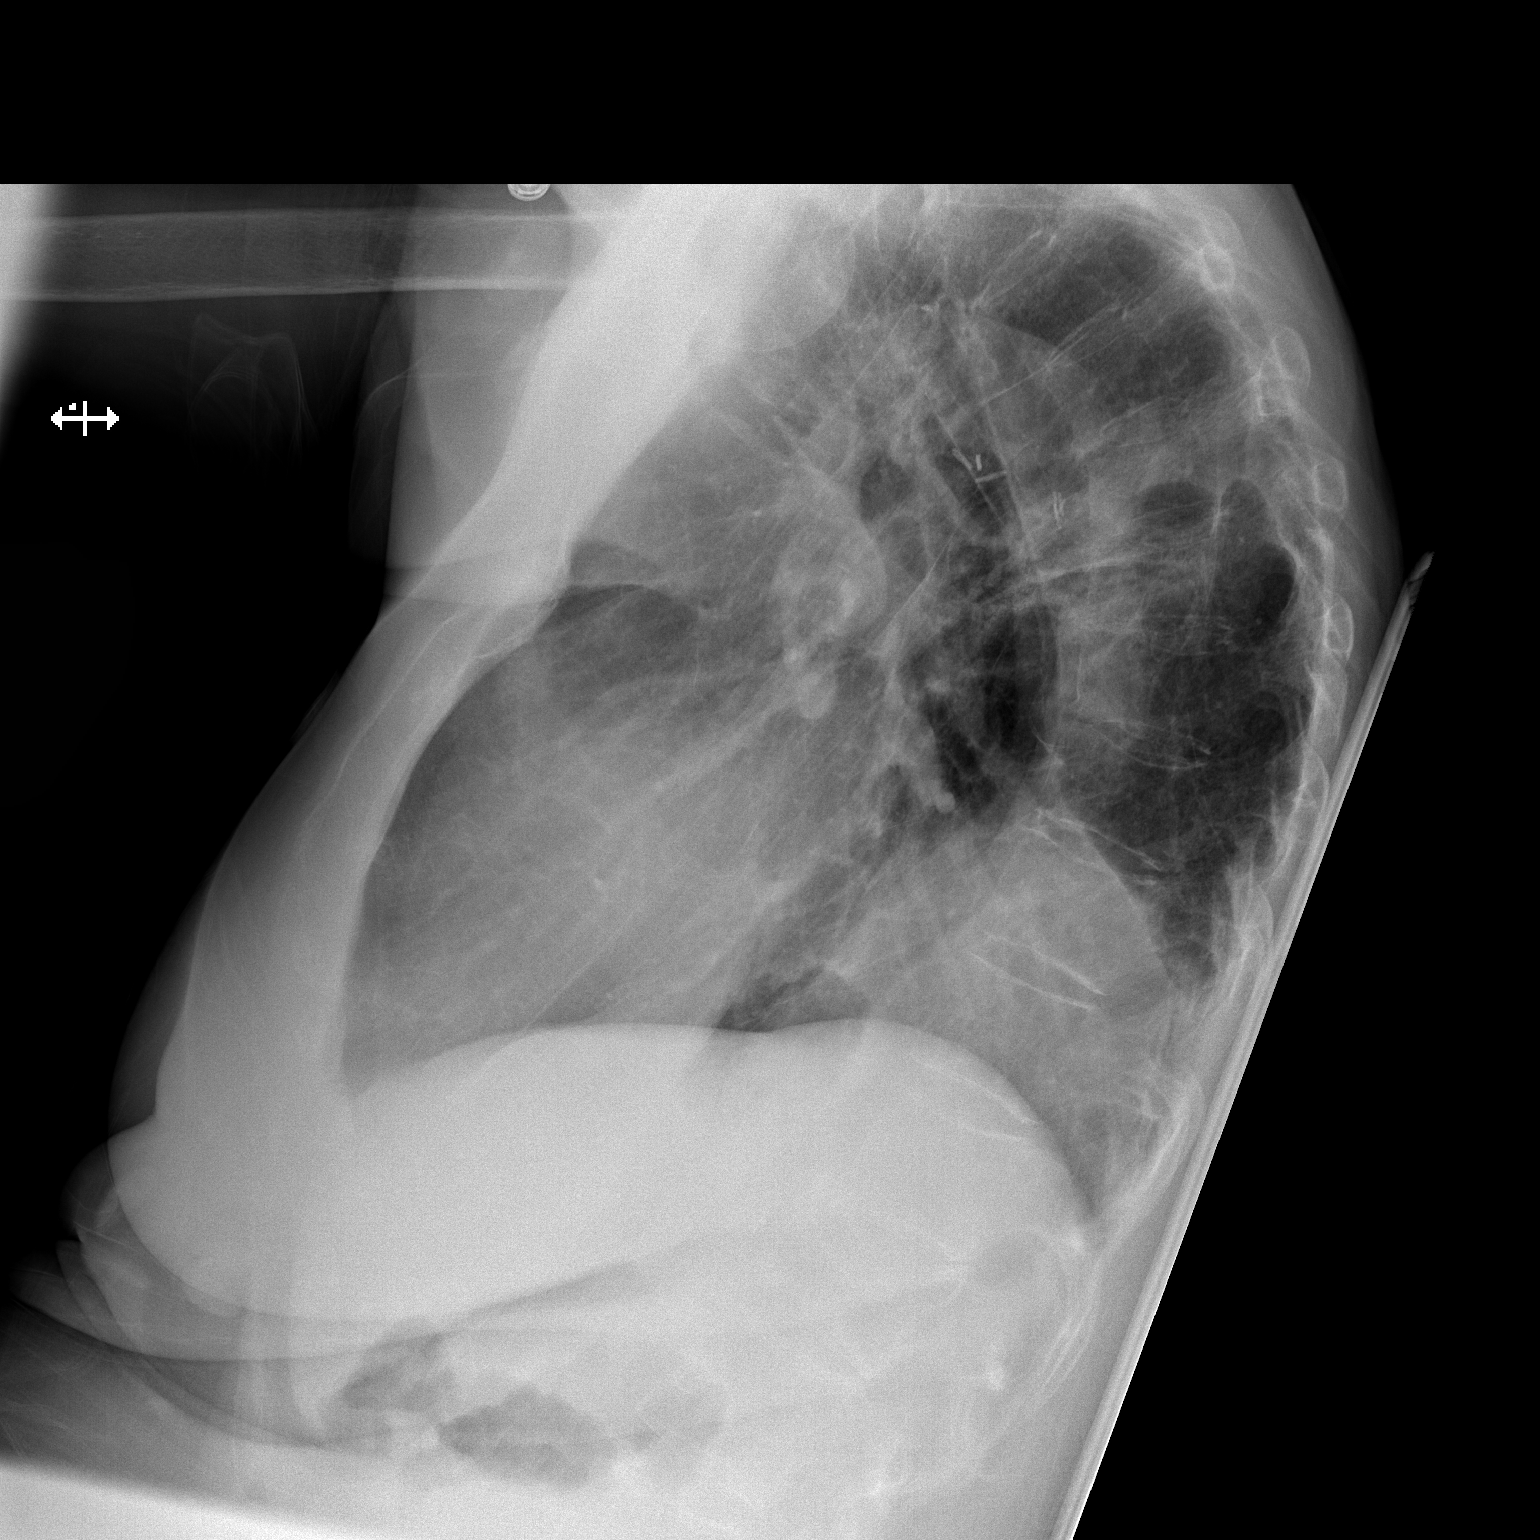

[x chest ap]
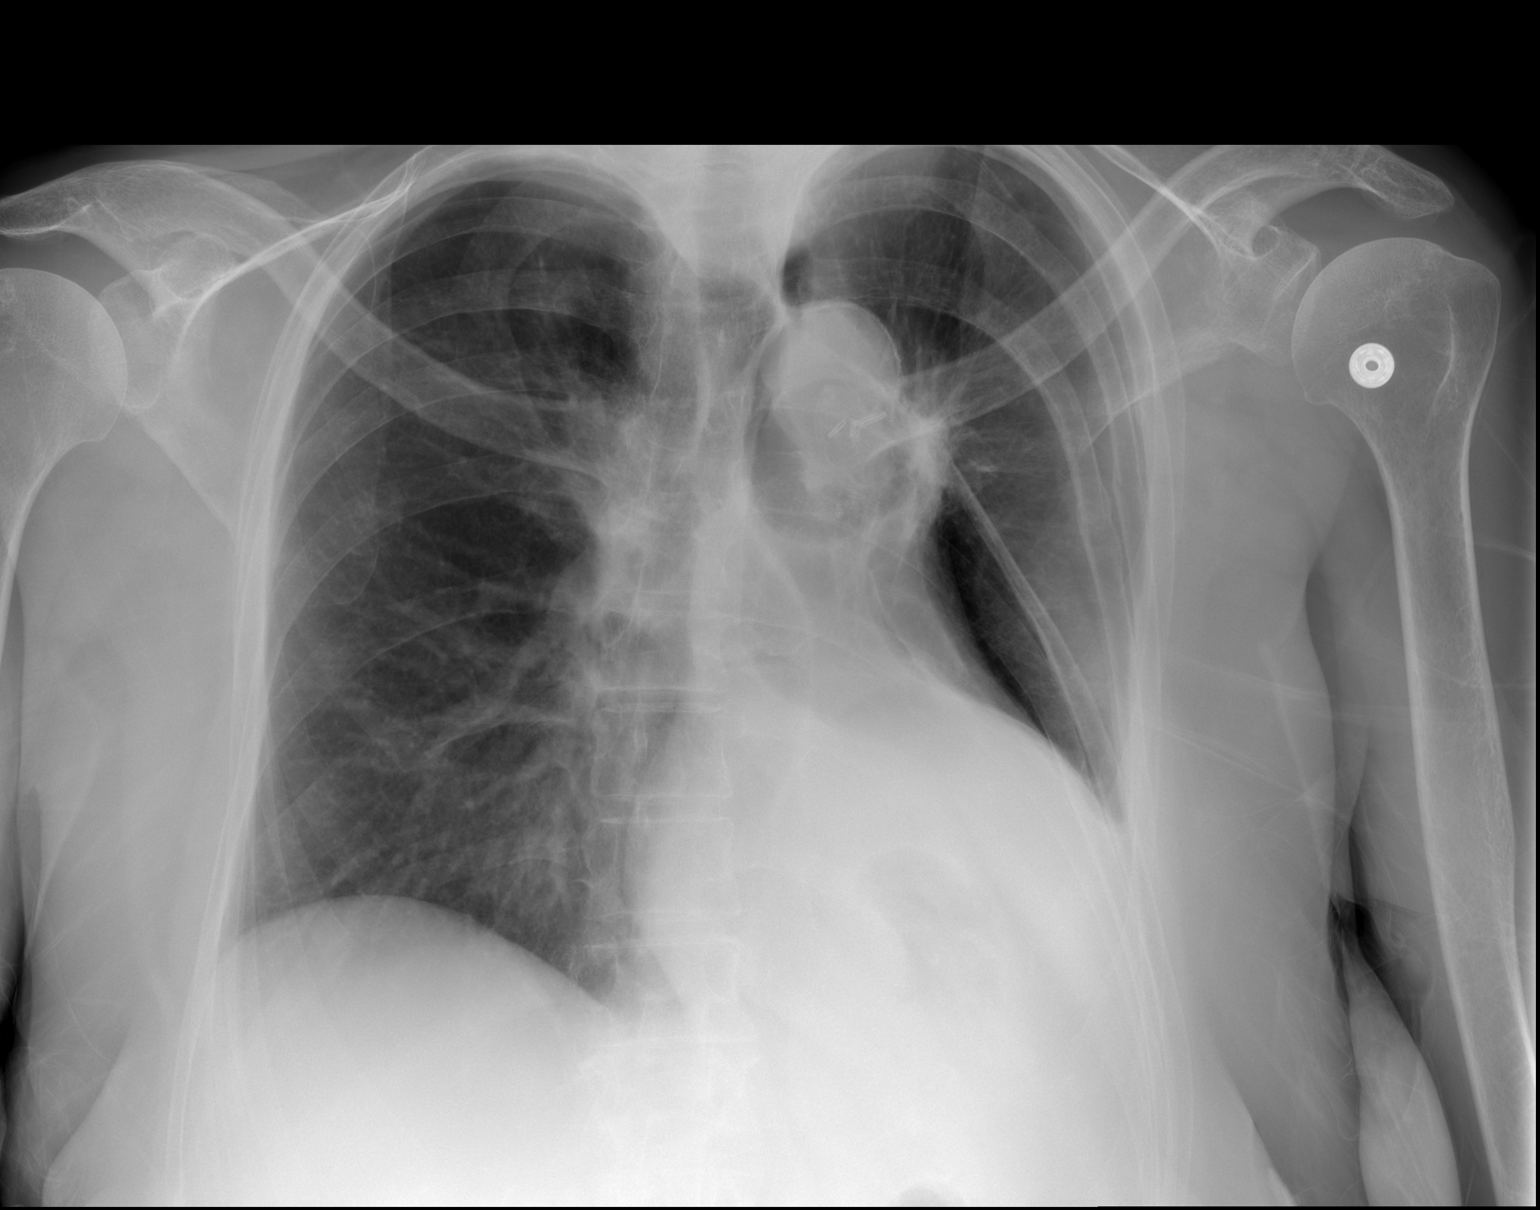

[2 of 2 positions shown; findings below may reference images not displayed]

FINDINGS: Heart size is stable. Marked elevation of left hemidiaphragm is
again seen. Stable postsurgical changes in the left hilar region
with superior retraction. No evidence of acute infiltrate or pleural
effusion. Is
IMPRESSION: Stable postsurgical changes in left hemithorax.  No acute findings.

## 2020-09-08 IMAGING — CR DG ANKLE COMPLETE 3+V*L*
3 series · 3 of 3 positions shown · non-contrast
Comparison: None.

CLINICAL DATA: Pain and bruising to the lateral left ankle post
fall.

EXAM:
LEFT ANKLE COMPLETE - 3+ VIEW

[x ankle ap left]
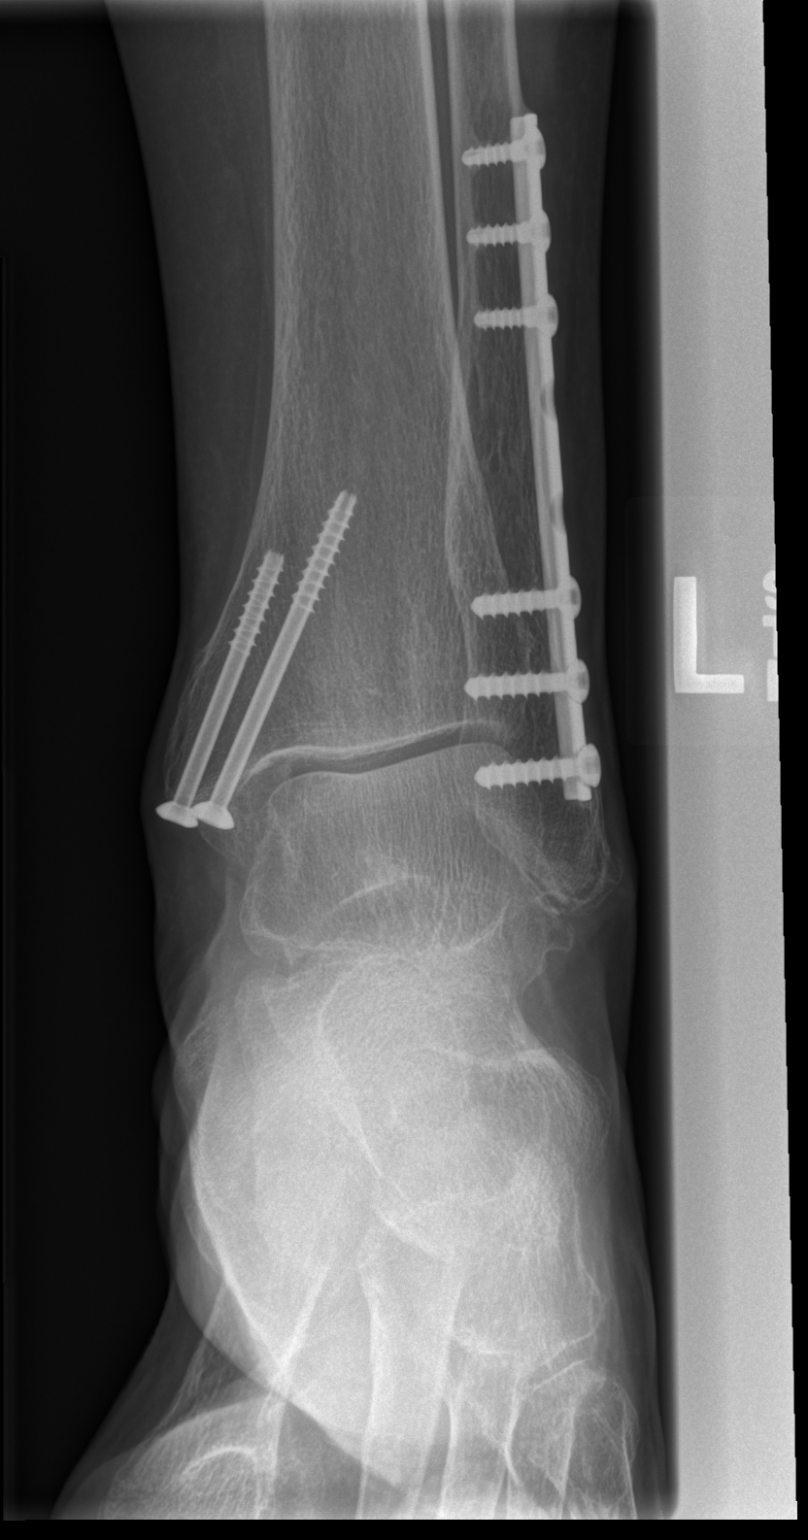

[x ankle obl left]
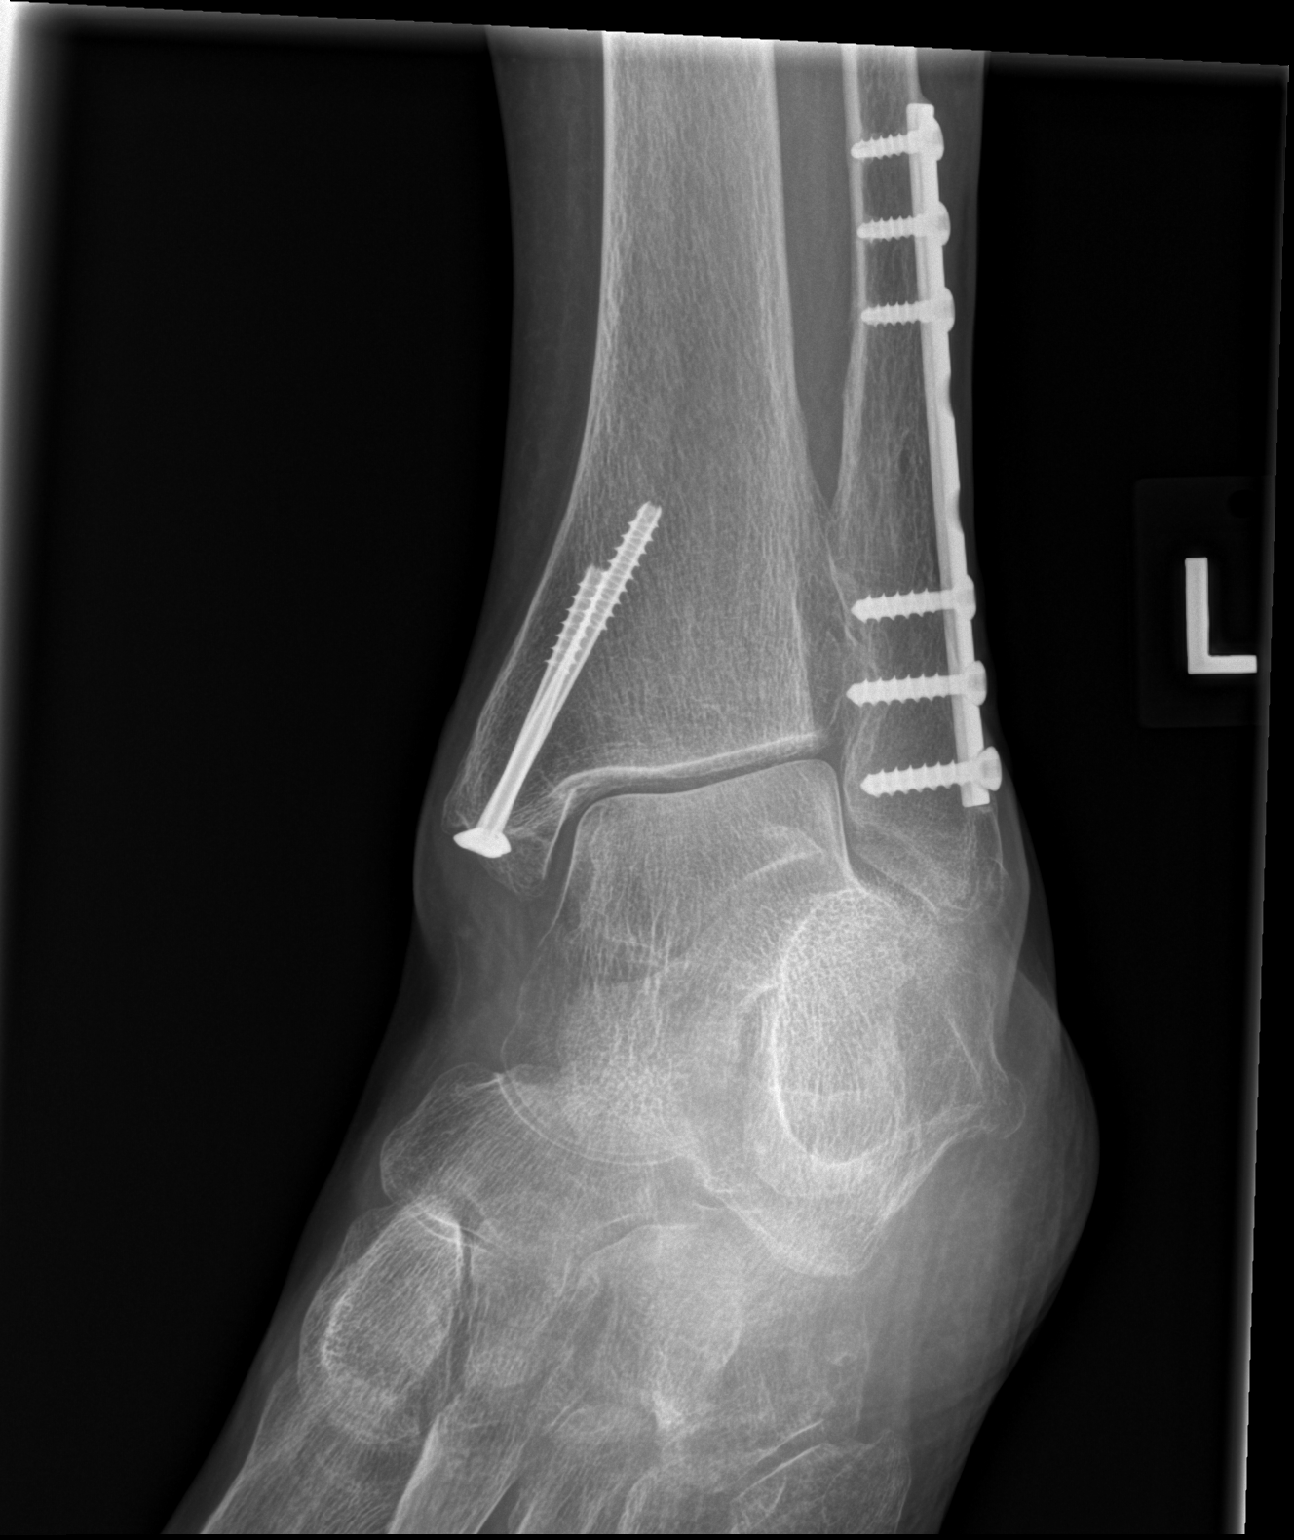

[x ankle lat left]
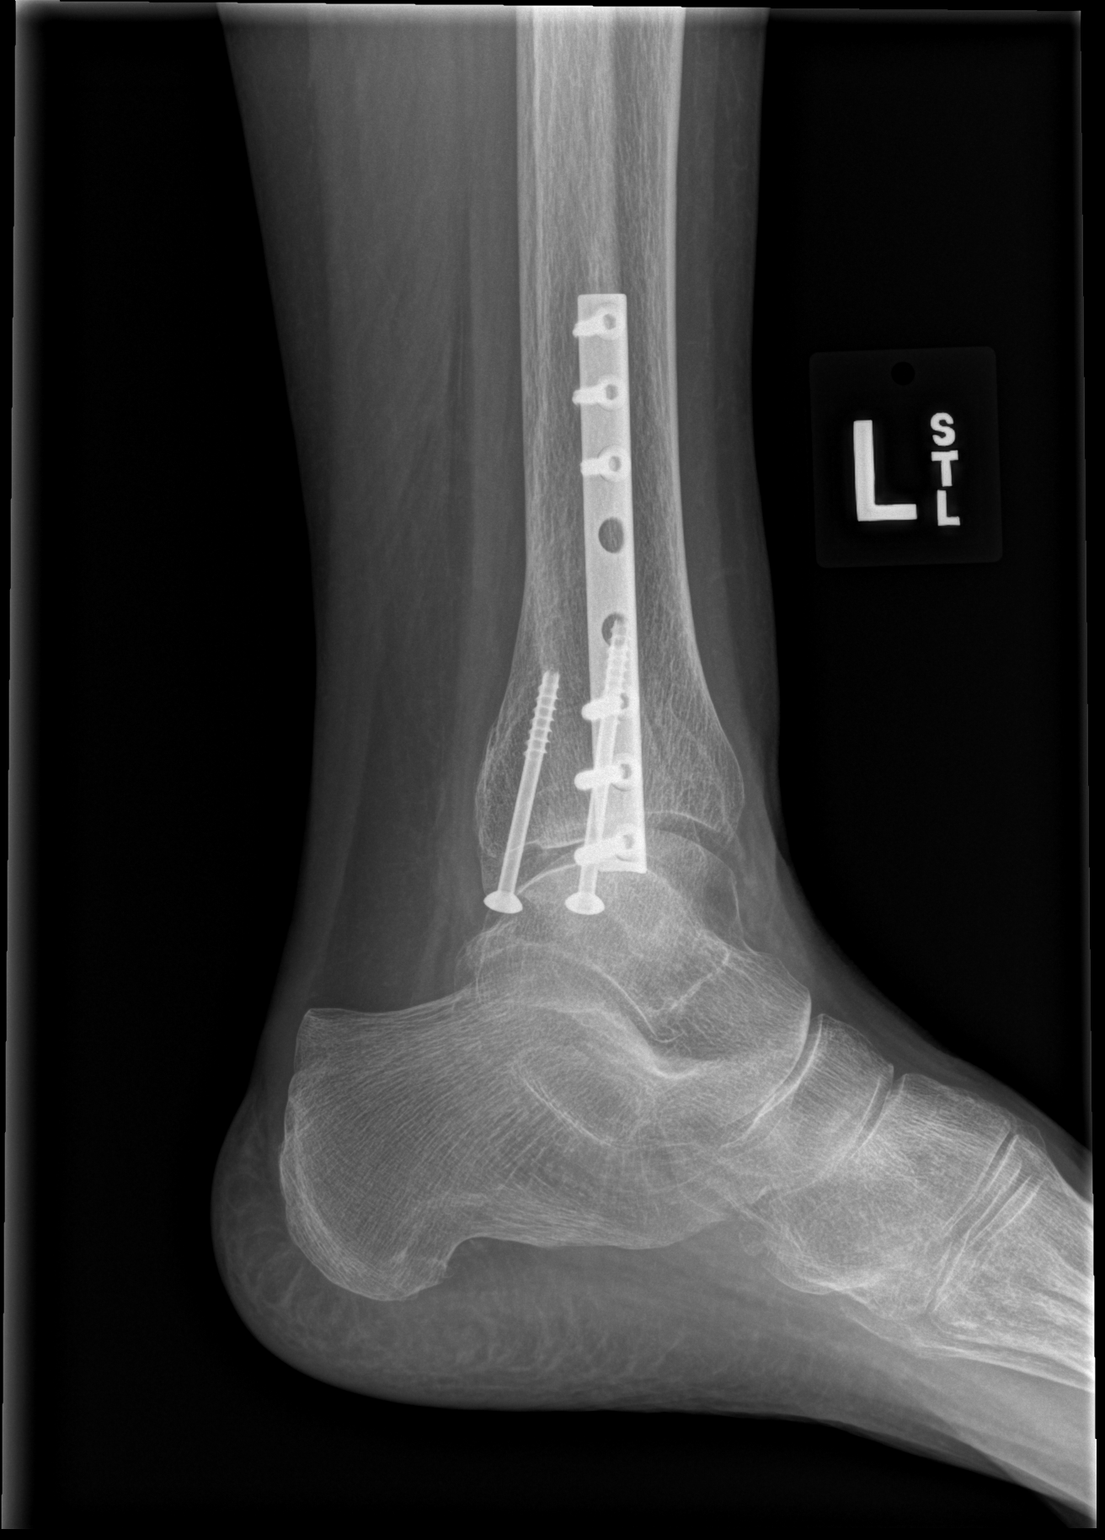

[3 of 3 positions shown; findings below may reference images not displayed]

FINDINGS: There is no evidence of fracture, dislocation, or joint effusion.
Intact sideplate and screw fixation of distal fibula and 2
cancellous screw fixation of the medial malleolus. Mild soft tissues
swelling about the medial malleolus.
IMPRESSION: No acute fracture or dislocation identified about the left ankle.

## 2020-09-09 NOTE — Therapy (Signed)
[place], [place], [address] Phone: [phone]   Fax:  [phone]  Physical Therapy Treatment  Patient Details  Name: Valerie [name] MRN: [id number] Date of Birth: [birth year] Referring Provider (PT): [name] MD   Encounter Date: 09/08/2020   PT End of Session - 09/09/20 0907    Visit Number 3    Number of Visits 24    Date for PT Re-Evaluation 11/15/20    Authorization Type [employer] MCR  10 VISIT PROGRESS NOTE; KX AT 15    PT Start Time 1455    PT Stop Time 1535    PT Time Calculation (min) 40 min    Activity Tolerance Patient tolerated treatment well    Behavior During Therapy [facility] for tasks assessed/performed           Past Medical History:  Diagnosis Date  . Abnormal Pap smear 2006  . Anal fistula   . Ankle fracture   . Anxiety   . Arthritis   . Atrophic vaginitis 2008  . Cataract   . Dementia ([place]) 2009  . Dyspareunia 2008  . H/O osteoporosis   . H/O varicella   . H/O vitamin D deficiency   . Headache 07/24/2016  . Heart murmur   . History of measles, mumps, or rubella   . History of radiation therapy 07/28/13- 08/10/13   right lung metastasis 5000 cGy 10 sessions  . Hypertension   . Lung cancer ([place]) dx'd 2002  . Lung cancer ([place])   . Lung cancer ([place])   . Lung metastasis ([place])    PET scan 05/05/13, RUL lung nodule  . 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 Nodule of right lung CT- 06/03/12   RIGHT UPPER LOBE  . Nodule of right lung 06/03/12   Upper Lobe  . On antineoplastic chemotherapy    TARCEVA  . Osteoporosis 2010  . Primary cancer of right upper lobe of lung ([place]) 04/22/2009   Qualifier: Diagnosis of  By: Nils Pyle CMA ([place]), Mearl Latin    . Shortness of breath    hx lung ca   . Status post chemotherapy 2003   CARBOPLATIN/PACLITAXEL /STATUS POST CLINICAL TRAIL OF CELEBREX AND IRESSA AT [facility] FOR 1 YEAR  . Status post  radiation therapy 2003   LEFT LUNG  . Status post radiation therapy 11/07/2005   WHOLE BRAIN: DR Larkin [facility]  . Status post radiation therapy 06/02/2008   GAMMA KNIFE OF RESECTED CAVITAY  . Thyroid adenoma    ?  [name] Thyroid cancer ([place]) 10/18/11 bx   adenoid nodules   . Yeast infection     Past Surgical History:  Procedure Laterality Date  . BRAIN SURGERY    . INTRAVASCULAR ULTRASOUND/IVUS N/A 02/16/2020   Procedure: Intravascular Ultrasound/IVUS;  Surgeon: Leonie Man, MD;  Location: [facility];  Service: Cardiovascular;  Laterality: N/A;  . LEFT LOWER LOBECTOMY  05/2001  . LEFT OCCIPITAL CRANIOTOMY  09/21/2005   tumor  . LUNG LOBECTOMY    . NECK SURGERY    . ORIF ANKLE FRACTURE Left 10/02/2014   Procedure: OPEN REDUCTION INTERNAL FIXATION (ORIF) ANKLE FRACTURE;  Surgeon: Alta Corning, MD;  Location: [facility];  Service: Orthopedics;  Laterality: Left;  . ORIF FEMUR FRACTURE Right 06/11/2019   Procedure: OPEN REDUCTION INTERNAL FIXATION (ORIF) DISTAL FEMUR FRACTURE;  Surgeon: Renette Butters, MD;  Location: [facility];  Service: Orthopedics;  Laterality: Right;  . ORIF TIBIA PLATEAU Right 06/11/2019   Procedure: Open Reduction Internal Fixation (Orif) Tibial Plateau;  Surgeon: Renette Butters, MD;  Location: [facility];  Service: Orthopedics;  Laterality: Right;  . RADICAL NECK DISSECTION  10/18/2011   Procedure: RADICAL NECK DISSECTION;  Surgeon: Izora Gala, MD;  Location: [facility];  Service: ENT;  Laterality: Right;  RIGHT MODIFIED NECK DISSECTION /POSSIBLE RIGHT THYROIDECTOM  . RIGHT/LEFT HEART CATH AND CORONARY ANGIOGRAPHY N/A 02/16/2020   Procedure: RIGHT/LEFT HEART CATH AND CORONARY ANGIOGRAPHY;  Surgeon: Leonie Man, MD;  Location: [facility];  Service: Cardiovascular;  Laterality: N/A;  . TEE WITHOUT CARDIOVERSION N/A 03/28/2020   Procedure: TRANSESOPHAGEAL ECHOCARDIOGRAM (TEE);  Surgeon: Buford Dresser, MD;  Location: [facility];  Service: Cardiovascular;   Laterality: N/A;  . THYROIDECTOMY  10/18/2011   Procedure: THYROIDECTOMY WITH RADICAL NECK DISSECTION;  Surgeon: Izora Gala, MD;  Location: [facility];  Service: ENT;  Laterality: Right;    There were no vitals filed for this visit.   Subjective Assessment - 09/08/20 1530    Subjective Patient with no significants compalits today. She has been trying to work on her exercises at home.    Pertinent History History of lung cancer for 18 years. ORIF Rt tibial plateau 06-11-19, recent hospitalization due to cellulitis and sepsis  See medical hx    How long can you sit comfortably? can sit in a wheelchar without difficulty    How long can you stand comfortably? can not stand.    How long can you walk comfortably? can not walk    Diagnostic tests recent hospitalization                             [facility] Adult PT Treatment/Exercise - 09/09/20 0001      Therapeutic Activites    Other Therapeutic Activities Sit to stand 2x in II bars. Despite total assist and cuing patient unable to stand; At hight low table patient able to stand from an elevated position but was significantly fatigued after. Therapy perfromed squat pivot to and from wheelchair. Patient tried to scoot but had difficulty. She was able to scoot over from the wheelchair to table last visit.      Knee/Hip Exercises: Seated   Long Arc Quad Limitations 3x10 1lb    Heel Slides Limitations hamstirng curl red 2x10 each leg    Other Seated Knee/Hip Exercises seated clamshell x20 yellow ; bilateral er 2x10 yellow; bilateral horizontal abduction 2x10 yellow; bilaterl; scap retraction 2x10 red Seated march 2x10 1lb    Other Seated Knee/Hip Exercises at the table 2x10 with cueing to hinge at the hips and keep head up.      Shoulder Exercises: Seated   Other Seated Exercises horizontal abduction 2x10 yellow; bilateral er 2x10 yellow; bilateral shoulder flexion with yellow 2x10 ; triceps extension red x20                     PT Short Term Goals - 08/23/20 1428      PT SHORT TERM GOAL #1   Title Patient will transfer sit to stand with min a    Baseline max A on eval    Time 6    Period Weeks    Status New    Target Date 10/04/20      PT SHORT TERM GOAL #2   Title Patient will stand for 60 seconds without significant fatigue  Baseline Pt attempted 35 sec then 15 sec then 15 sec fatigues easily    Time 6    Period Weeks    Status New    Target Date 10/04/20      PT SHORT TERM GOAL #3   Title Patient will use walker to take a step    Baseline Pt unable to take a step at eval    Time 6    Period Weeks    Status New    Target Date 10/04/20      PT SHORT TERM GOAL #4   Title Pt will use sliding board and min A from W/C to mat    Baseline Pt requires mod to Max A due to fatigue and requires extra time to complete movement    Time 6    Period Weeks    Status New    Target Date 10/04/20             PT Long Term Goals - 08/23/20 1753      PT LONG TERM GOAL #1   Title Patient will ambulate 44' with mod A and AD    Time 12    Period Weeks    Status New    Target Date 11/15/20      PT LONG TERM GOAL #2   Title Patient will trnasfer from her wheelchair to a regular chair with min gaurd with walker    Time 12    Period Weeks    Status New    Target Date 11/15/20      PT LONG TERM GOAL #3   Title Patient will stand for 5 minutes in order to perfrom ADL's at the sink    Baseline Eval 35 sec , then 15 sec fatigues easily    Time 12    Status New    Target Date 11/15/20      PT LONG TERM GOAL #4   Title Pt will be able to perform full range bridge in order to perform bed mobility with 50% greater ease    Time 12    Period Weeks    Status New    Target Date 11/15/20                 Plan - 09/09/20 7564    Clinical Impression Statement Therapy tried to get the patient to transfer from sit to stand in the parrellel bars but she was unabelt to come to a full  standing position. We were able to perfrom a squat pivot transfer to high low table. She was able to stand from that position for 20 seconds. After sitting she was fatigued and had difficulty maintaining na unsupported sitting position at the edge of the table. Therapy heled her perfrom a squat pivot transfer back to the table with max a. She toleratred therr-es in sitting after but becasme fatigued qucikly. Overall she had more fatigue today and more difficulty with standing. We will continue to monitor.    Personal Factors and Comorbidities Comorbidity 1;Comorbidity 2;Comorbidity 3+    Comorbidities History of lung cancer for 18 years. ORIF Rt tibial plateau 06-11-19, recent hospitalization due to cellulitis and sepsis  See medical hx    Examination-Activity Limitations Bed Mobility;Bathing;Bend;Carry;Lift;Dressing;Locomotion Level;Squat;Stairs;Stand;Toileting;Transfers    Examination-Participation Restrictions Church;Community Activity;Cleaning;Laundry;Medication Management    Stability/Clinical Decision Making Unstable/Unpredictable    Clinical Decision Making High    Rehab Potential Fair    PT Frequency 2x / week    PT Duration 12  weeks    PT Treatment/Interventions ADLs/Self Care Home Management;Cryotherapy;Gait training;Stair training;Functional mobility training;Therapeutic activities;Therapeutic exercise;Neuromuscular re-education;Patient/family education;Manual techniques    PT Next Visit Plan Pt needs to work on transfers with sliding board for functional strength.  review HEP with resistance (yellow to challenge in sitting)    PT Home Exercise Plan [id number]    Consulted and Agree with Plan of Care Patient           Patient will benefit from skilled therapeutic intervention in order to improve the following deficits and impairments:  Abnormal gait,Decreased activity tolerance,Decreased endurance,Difficulty walking,Decreased mobility,Decreased strength,Pain,Postural dysfunction  Visit  Diagnosis: Other abnormalities of gait and mobility  Muscle weakness (generalized)  Abnormal posture     Problem List Patient Active Problem List   Diagnosis Date Noted  . Malnutrition of moderate degree 06/13/2020  . Rash and nonspecific skin eruption   . Metastatic malignant neoplasm ([place])   . Failure to thrive in adult   . Asymptomatic bacteriuria   . Pressure injury of skin 06/03/2020  . Cellulitis 06/03/2020  . SVT (supraventricular tachycardia) ([place]) 03/26/2020  . Normocytic anemia 03/24/2020  . Urinary tract infection without hematuria   . AMS (altered mental status) 03/23/2020  . Hypokalemia 03/23/2020  . Prolonged Q-T interval on ECG 03/23/2020  . DNR (do not resuscitate) 03/23/2020  . MRSA bacteremia 03/23/2020  . Acute combined systolic and diastolic heart failure ([place])   . Elevated troponin   . Dilated cardiomyopathy ([place])   . Coronary artery disease involving native coronary artery of native heart without angina pectoris   . Congestive heart failure (CHF) ([place]) 02/14/2020  . Pyelonephritis 09/10/2019  . Acute blood loss anemia 06/12/2019  . Closed fracture of distal end of right femur, initial encounter ([place]) 06/11/2019  . Closed fracture of lateral portion of right tibial plateau 06/11/2019  . History of DVT of lower extremity 06/11/2019  . Blurry vision 01/06/2019  . Headache 07/24/2016  . Cough 08/24/2015  . Chronic diarrhea 08/24/2015  . Sepsis ([place])   . Encounter for antineoplastic chemotherapy 01/31/2015  . Urinary retention 10/20/2014  . Ileus ([place]) 10/20/2014  . Cellulitis of leg, right 10/20/2014  . Fracture of right proximal fibula   . Ankle fracture 10/02/2014  . Ankle fracture 10/02/2014  . Lung cancer ([place]) 10/02/2014  . Closed fibular fracture 10/02/2014  . Dementia ([place]) 10/02/2014  . Lower urinary tract infectious disease 10/02/2014  . Reactive airway disease 10/02/2014  . Fall 10/02/2014  . Closed left ankle fracture 10/01/2014  .  Right fibular fracture 10/01/2014  . Lung metastasis ([place])   . LESION, ANUS 04/26/2009  . DIARRHEA 04/26/2009  . Primary cancer of right upper lobe of lung ([place]) 04/22/2009  . Malignant neoplasm of brain ([place]) 04/22/2009  . VITAMIN D DEFICIENCY 04/22/2009  . ANAL FISTULA 04/22/2009  . OSTEOPOROSIS 04/22/2009    Carney Living PT DPT  09/09/2020, 9:13 AM  [facility] [address] [place], [place], [address] Phone: [phone]   Fax:  [phone]  Name: Valerie [name] MRN: [id number] Date of Birth: September 04, 1945

## 2020-09-13 ENCOUNTER — Telehealth: Payer: Self-pay

## 2020-09-13 ENCOUNTER — Telehealth: Payer: Medicare PPO

## 2020-09-13 ENCOUNTER — Encounter: Payer: Medicare PPO | Admitting: Physical Therapy

## 2020-09-13 NOTE — Telephone Encounter (Cosign Needed)
   09/13/2020  Valerie [name] 06/02/1946 [id number]  An unsuccessful telephone outreach was attempted today. The patient was referred to the case management team for assistance with care management and care coordination.   Follow Up Plan: Telephone follow up appointment with care management team member scheduled for: 10/19/20  Barb Merino, RN, BSN, CCM Care Management Coordinator [place]  Direct Phone: [phone]

## 2020-09-14 ENCOUNTER — Ambulatory Visit (INDEPENDENT_AMBULATORY_CARE_PROVIDER_SITE_OTHER): Payer: Medicare PPO

## 2020-09-14 VITALS — Ht 68.0 in | Wt 160.0 lb

## 2020-09-14 DIAGNOSIS — Z0000 Encounter for general adult medical examination without abnormal findings: Secondary | ICD-10-CM

## 2020-09-14 NOTE — Progress Notes (Signed)
I connected with Valerie [name] today by telephone and verified that I am speaking with the correct person using two identifiers. Location patient: home Location provider: work Persons participating in the virtual visit: [name], spouse , Glenna Durand LPN.   I discussed the limitations, risks, security and privacy concerns of performing an evaluation and management service by telephone and the availability of in person appointments. I also discussed with the patient that there may be a patient responsible charge related to this service. The patient expressed understanding and verbally consented to this telephonic visit.    Interactive audio and video telecommunications were attempted between this provider and patient, however failed, due to patient having technical difficulties OR patient did not have access to video capability.  We continued and completed visit with audio only.     Vital signs may be patient reported or missing.  Subjective:   Valerie [name] is a 75 y.o. female who presents for Medicare Annual (Subsequent) preventive examination.  Review of Systems     Cardiac Risk Factors include: advanced age (>30men, >70 women);sedentary lifestyle     Objective:    Today's Vitals   09/14/20 1444  Weight: 160 lb (72.6 kg)  Height: 5\' 8"  (1.727 m)   Body mass index is 24.33 kg/m.  Advanced Directives 09/14/2020 06/03/2020 05/17/2020 03/23/2020 03/22/2020 02/15/2020 11/23/2019  Does Patient Have a Medical Advance Directive? Yes No No No No No No  Type of Advance Directive Out of facility DNR (pink MOST or yellow form) - - - - - -  Does patient want to make changes to medical advance directive? - - - - - - -  Copy of [place] in Chart? Yes - validated most recent copy scanned in chart (See row information) - - - - - -  Would patient like information on creating a medical advance directive? - No - Patient declined No - Patient declined No - Patient  declined - No - Patient declined No - Patient declined  Pre-existing out of facility DNR order (yellow form or pink MOST form) - - - - - - -    Current Medications (verified) Outpatient Encounter Medications as of 09/14/2020  Medication Sig  . Ascorbic Acid (VITAMIN C) 100 MG tablet Take 100 mg by mouth every evening. Gummie  . Cholecalciferol (D3 PO) Take 1 capsule by mouth daily.  . diphenhydrAMINE (BENADRYL) 25 MG tablet Take 25 mg by mouth every 6 (six) hours as needed for allergies.  [name] donepezil (ARICEPT) 10 MG tablet Take 10 mg by mouth at bedtime.  . erlotinib (TARCEVA) 100 MG tablet Take 1 tablet (100 mg total) by mouth daily. Take on an empty stomach 1 hour before meals or 2 hours after  . hydrocerin (EUCERIN) CREA Apply 1 application topically 2 (two) times daily. To back and buttocks.  . loperamide (IMODIUM A-D) 2 MG tablet Take 4 mg by mouth in the morning and at bedtime.  [name] MAGNESIUM PO Take 1 tablet by mouth daily.  . metoprolol succinate (TOPROL-XL) 25 MG 24 hr tablet Take 1 tablet (25 mg total) by mouth daily.  . Multiple Vitamin (MULTI-VITAMINS) TABS Take 1 tablet by mouth every evening.   . potassium chloride SA (KLOR-CON) 20 MEQ tablet Take 20 mEq by mouth daily.  . prochlorperazine (COMPAZINE) 10 MG tablet Take 1 tablet (10 mg total) by mouth every 6 (six) hours as needed for nausea or vomiting.  . vitamin B-12 (CYANOCOBALAMIN) 1000 MCG tablet Take 1,000  mcg by mouth every evening.  [name] 20 MG TABS tablet TAKE 1 TABLET BY MOUTH ONCE DAILY IN THE EVENING   No facility-administered encounter medications on file as of 09/14/2020.    Allergies (verified) Patient has no known allergies.   History: Past Medical History:  Diagnosis Date  . Abnormal Pap smear 2006  . Anal fistula   . Ankle fracture   . Anxiety   . Arthritis   . Atrophic vaginitis 2008  . Cataract   . Dementia ([place]) 2009  . Dyspareunia 2008  . H/O osteoporosis   . H/O varicella   . H/O vitamin  D deficiency   . Headache 07/24/2016  . Heart murmur   . History of measles, mumps, or rubella   . History of radiation therapy 07/28/13- 08/10/13   right lung metastasis 5000 cGy 10 sessions  . Hypertension   . Lung cancer ([place]) dx'd 2002  . Lung cancer ([place])   . Lung cancer ([place])   . Lung metastasis ([place])    PET scan 05/05/13, RUL lung nodule  . 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 Nodule of right lung CT- 06/03/12   RIGHT UPPER LOBE  . Nodule of right lung 06/03/12   Upper Lobe  . On antineoplastic chemotherapy    TARCEVA  . Osteoporosis 2010  . Primary cancer of right upper lobe of lung ([place]) 04/22/2009   Qualifier: Diagnosis of  By: Nils Pyle CMA ([place]), Mearl Latin    . Shortness of breath    hx lung ca   . Status post chemotherapy 2003   CARBOPLATIN/PACLITAXEL /STATUS POST CLINICAL TRAIL OF CELEBREX AND IRESSA AT [facility] FOR 1 YEAR  . Status post radiation therapy 2003   LEFT LUNG  . Status post radiation therapy 11/07/2005   WHOLE BRAIN: DR Larkin [facility]  . Status post radiation therapy 06/02/2008   GAMMA KNIFE OF RESECTED CAVITAY  . Thyroid adenoma    ?  [name] Thyroid cancer ([place]) 10/18/11 bx   adenoid nodules   . Yeast infection    Past Surgical History:  Procedure Laterality Date  . BRAIN SURGERY    . INTRAVASCULAR ULTRASOUND/IVUS N/A 02/16/2020   Procedure: Intravascular Ultrasound/IVUS;  Surgeon: Leonie Man, MD;  Location: [facility];  Service: Cardiovascular;  Laterality: N/A;  . LEFT LOWER LOBECTOMY  05/2001  . LEFT OCCIPITAL CRANIOTOMY  09/21/2005   tumor  . LUNG LOBECTOMY    . NECK SURGERY    . ORIF ANKLE FRACTURE Left 10/02/2014   Procedure: OPEN REDUCTION INTERNAL FIXATION (ORIF) ANKLE FRACTURE;  Surgeon: Alta Corning, MD;  Location: [facility];  Service: Orthopedics;  Laterality: Left;  . ORIF FEMUR FRACTURE Right 06/11/2019   Procedure: OPEN REDUCTION INTERNAL FIXATION (ORIF) DISTAL FEMUR FRACTURE;  Surgeon:  Renette Butters, MD;  Location: [place] [facility];  Service: Orthopedics;  Laterality: Right;  . ORIF TIBIA PLATEAU Right 06/11/2019   Procedure: Open Reduction Internal Fixation (Orif) Tibial Plateau;  Surgeon: Renette Butters, MD;  Location: [facility];  Service: Orthopedics;  Laterality: Right;  . RADICAL NECK DISSECTION  10/18/2011   Procedure: RADICAL NECK DISSECTION;  Surgeon: Izora Gala, MD;  Location: [facility];  Service: ENT;  Laterality: Right;  RIGHT MODIFIED NECK DISSECTION /POSSIBLE RIGHT THYROIDECTOM  . RIGHT/LEFT HEART CATH AND CORONARY ANGIOGRAPHY N/A 02/16/2020   Procedure: RIGHT/LEFT HEART CATH AND CORONARY ANGIOGRAPHY;  Surgeon: Leonie Man, MD;  Location: [place] [facility]  CV [facility];  Service: Cardiovascular;  Laterality: N/A;  . TEE WITHOUT CARDIOVERSION N/A 03/28/2020   Procedure: TRANSESOPHAGEAL ECHOCARDIOGRAM (TEE);  Surgeon: Buford Dresser, MD;  [facility];  Service: Cardiovascular;  Laterality: N/A;  . THYROIDECTOMY  10/18/2011   Procedure: THYROIDECTOMY WITH RADICAL NECK DISSECTION;  Surgeon: Izora Gala, MD;  Location: [facility] OR;  Service: ENT;  Laterality: Right;   Family History  Problem Relation Age of Onset  . Gait disorder Mother   . Cancer Mother        Meningioma  . Cancer Father        Pancreatic  . Heart failure Sister   . Cancer Maternal Aunt        menigeoma   Social History   Socioeconomic History  . Marital status: Married    Spouse name: Not on file  . Number of children: 2  . Years of education: Not on file  . Highest education level: Not on file  Occupational History  . Occupation: Pharmacist, hospital    Comment: retired after 45 years,from [employer]  Tobacco Use  . Smoking status: Never Smoker  . Smokeless tobacco: Never Used  . Tobacco comment: smoked few years in college  Vaping Use  . Vaping Use: Never used  Substance and Sexual Activity  . Alcohol use: No  . Drug use: No  . Sexual activity: Yes    Birth control/protection: Post-menopausal   Other Topics Concern  . Not on file  Social History Narrative   ** Merged History Encounter **       Social Determinants of Health   Financial Resource Strain: Low Risk   . Difficulty of Paying Living Expenses: Not hard at all  Food Insecurity: No Food Insecurity  . Worried About Charity fundraiser in the Last Year: Never true  . Ran Out of Food in the Last Year: Never true  Transportation Needs: No Transportation Needs  . Lack of Transportation (Medical): No  . Lack of Transportation (Non-Medical): No  Physical Activity: Inactive  . Days of Exercise per Week: 0 days  . Minutes of Exercise per Session: 0 min  Stress: No Stress Concern Present  . Feeling of Stress : Not at all  Social Connections: Not on file    Tobacco Counseling Counseling given: Not Answered Comment: smoked few years in college   Clinical Intake:  Pre-visit preparation completed: Yes  Pain : No/denies pain     Nutritional Status: BMI of 19-24  Normal Nutritional Risks: Nausea/ vomitting/ diarrhea (diarrhea last night) Diabetes: No  How often do you need to have someone help you when you read instructions, pamphlets, or other written materials from your doctor or pharmacy?: 1 - Never What is the last grade level you completed in school?: master's degree  Diabetic? no  Interpreter Needed?: No  Information entered by :: NAllen LPN   Activities of Daily Living In your present state of health, do you have any difficulty performing the following activities: 09/14/2020 06/03/2020  Hearing? Y N  Comment a little decreased hearing per patient opinion -  Vision? Y Y  Comment some blurriness needs new glasses per pt  Difficulty concentrating or making decisions? Y Y  Comment trouble with names " a little bit"  Walking or climbing stairs? Y Y  Comment - -  Dressing or bathing? Y Y  Doing errands, shopping? Tempie Donning  Preparing Food and eating ? Y -  Comment husband prepares -  Using the Toilet? Y -  In the past six months, have you accidently leaked urine? Y -  Comment wears depends -  Do you have problems with loss of bowel control? Y -  Managing your Medications? Y -  Managing your Finances? Y -  Housekeeping or managing your Housekeeping? Y -  Some recent data might be hidden    Patient Care Team: [name], MD as PCP - General (Internal Medicine) Buford Dresser, MD as PCP - Cardiology (Cardiology) [name], MD (Internal Medicine) Marylynn Pearson, MD as Consulting Physician (Ophthalmology)  Indicate any recent Medical Services you may have received from other than [facility] providers in the past year (date may be approximate).     Assessment:   This is a routine wellness examination for [place].  Hearing/Vision screen  Hearing Screening   125Hz  250Hz  500Hz  1000Hz  2000Hz  3000Hz  4000Hz  6000Hz  8000Hz   Right ear:           Left ear:           Vision Screening Comments: No regular eye exams  Dietary issues and exercise activities discussed: Current Exercise Habits: The patient does not participate in regular exercise at present  Goals    .  "to get her stronger"      Husband stated [place] (see longitudinal plan of care for additional care plan information)  Current Barriers:  . Chronic Disease Management support and education needs related to Malignant Neoplasm of upper lobe of right lung; Dementia w/o behavior disturbance, unspecified dementia type, Gait abnormality, Debility . Cognitive Deficits  Nurse Case Manager Clinical Goal(s):  [name] Over the next 90 days, patient will work with the CCM team and PCP to address needs related to disease education and support to improve Self Health management of chronic health conditions  CCM RN CM Interventions:  08/03/20 call completed with husband [name] . Inter-disciplinary care team collaboration (see longitudinal plan of care) . Evaluation of current treatment plan related to Impaired Physical  Mobility and Self Care deficit and patient's adherence to plan as established by provider . Determined patient has all DME in the home needed at this time . Determined patient continues to receive Remote Health services . Determined Mrs. Easton will start outpatient PT in January to work on balance and strengthening  . Determined patient will need a SNV to obtain urine sample per Dr. Baird Cancer . Collaborated with Dr. Baird Cancer concerning the need for a repeat UA, remote health is unable to obtain, orders sent to [facility] per Dr. Baird Cancer for [facility], husband aware . Discussed plans with patient for ongoing care management follow up and provided husband with direct contact information for care management team  Patient Self Care Activities:  Over the next 90 days, patient/spouse will: . Attend all scheduled provider appointments . Call provider office for new concerns or questions . Continue to take prescribed medications as directed . Provide urine specimen for recheck of UTI . Follow up with [facility] outpatient PT as scheduled on 08/23/20 to work on endurance and strengthening   Please see past updates related to this goal by clicking on the "Past Updates" button in the selected goal      .  Patient Stated (pt-stated)      Wants to walk without walker    .  Patient Stated      09/14/2020, wants to walk      Depression Screen PHQ 2/9 Scores 09/14/2020 04/18/2020 03/17/2019 12/18/2018 10/16/2018 06/11/2018  PHQ - 2 Score 0 0 0 0 0  0  PHQ- 9 Score - 0 - - 6 -    Fall Risk Fall Risk  09/14/2020 03/17/2019 12/18/2018 10/16/2018 06/11/2018  Falls in the past year? 0 0 1 1 No  Number falls in past yr: - - 0 0 -  Comment - - - slid off the bed -  Injury with Fall? - - 0 0 -  Risk for fall due to : Impaired balance/gait;Impaired mobility;Medication side effect - - Impaired balance/gait;Impaired mobility;History of fall(s) -  Follow up Falls evaluation completed;Education provided;Falls prevention discussed -  - Education provided;Falls prevention discussed -    FALL RISK PREVENTION PERTAINING TO THE HOME:  Any stairs in or around the home? Yes  If so, are there any without handrails? No  Home free of loose throw rugs in walkways, pet beds, electrical cords, etc? Yes  Adequate lighting in your home to reduce risk of falls? Yes   ASSISTIVE DEVICES UTILIZED TO PREVENT FALLS:  Life alert? Yes  Use of a cane, walker or w/c? Yes  Grab bars in the bathroom? No  Shower chair or bench in shower? Yes  Elevated toilet seat or a handicapped toilet? Yes   TIMED UP AND GO:  Was the test performed? No .   Cognitive Function:     6CIT Screen 09/14/2020 10/16/2018  What Year? 0 points 0 points  What month? 0 points -  What time? 0 points 3 points  Count back from 20 4 points 0 points  Months in reverse 4 points 4 points  Repeat phrase 4 points 2 points  Total Score 12 -    Immunizations Immunization History  Administered Date(s) Administered  . Fluad Quad(high Dose 65+) 07/19/2020  . Influenza Split 06/05/2012  . Influenza,inj,Quad PF,6+ Mos 05/05/2013, 05/20/2014, 05/31/2015, 08/06/2017  . Influenza-Unspecified 05/21/2018  . Moderna Sars-Covid-2 Vaccination 09/29/2019, 11/02/2019, 08/02/2020  . Pneumococcal Conjugate-13 05/31/2015    TDAP status: Up to date  Flu Vaccine status: Up to date  Pneumococcal vaccine status: Up to date  Covid-19 vaccine status: Completed vaccines  Qualifies for Shingles Vaccine? Yes   Zostavax completed No   Shingrix Completed?: No.    Education has been provided regarding the importance of this vaccine. Patient has been advised to call insurance company to determine out of pocket expense if they have not yet received this vaccine. Advised may also receive vaccine at local pharmacy or Health Dept. Verbalized acceptance and understanding.  Screening Tests Health Maintenance  Topic Date Due  . COVID-19 Vaccine (4 - Booster for Moderna series) 01/31/2021   . COLONOSCOPY (Pts 45-62yrs Insurance coverage will need to be confirmed)  01/31/2022  . TETANUS/TDAP  10/23/2023  . INFLUENZA VACCINE  Completed  . DEXA SCAN  Completed  . Hepatitis C Screening  Completed  . PNA vac Low Risk Adult  Completed    Health Maintenance  There are no preventive care reminders to display for this patient.  Colorectal cancer screening: Type of screening: Colonoscopy. Completed 02/01/2012. Repeat every 10 years  Mammogram status: No longer required due to age.  Bone Density status: Completed 08/29/2017.   Lung Cancer Screening: (Low Dose CT Chest recommended if Age 15-80 years, 30 pack-year currently smoking OR have quit w/in 15years.) does not qualify.   Lung Cancer Screening Referral: no  Additional Screening:  Hepatitis C Screening: does qualify; Completed 06/09/2020  Vision Screening: Recommended annual ophthalmology exams for early detection of glaucoma and other disorders of the eye. Is the patient up to date  with their annual eye exam?  No  Who is the provider or what is the name of the office in which the patient attends annual eye exams? none If pt is not established with a provider, would they like to be referred to a provider to establish care? No .   Dental Screening: Recommended annual dental exams for proper oral hygiene  Community Resource Referral / Chronic Care Management: CRR required this visit?  No   CCM required this visit?  No      Plan:     I have personally reviewed and noted the following in the patient's chart:   . Medical and social history . Use of alcohol, tobacco or illicit drugs  . Current medications and supplements . Functional ability and status . Nutritional status . Physical activity . Advanced directives . List of other physicians . Hospitalizations, surgeries, and ER visits in previous 12 months . Vitals . Screenings to include cognitive, depression, and falls . Referrals and appointments  In  addition, I have reviewed and discussed with patient certain preventive protocols, quality metrics, and best practice recommendations. A written personalized care plan for preventive services as well as general preventive health recommendations were provided to patient.     Kellie Simmering, LPN   6/68/1594   Nurse Notes:

## 2020-09-14 NOTE — Patient Instructions (Signed)
Ms. Buda , Thank you for taking time to come for your Medicare Wellness Visit. I appreciate your ongoing commitment to your health goals. Please review the following plan we discussed and let me know if I can assist you in the future.   Screening recommendations/referrals: Colonoscopy: completed 02/01/2012, due 01/31/2022 Mammogram: not required Bone Density: completed 08/29/2017 Recommended yearly ophthalmology/optometry visit for glaucoma screening and checkup Recommended yearly dental visit for hygiene and checkup  Vaccinations: Influenza vaccine: completed 07/19/2020, due 03/20/2021 Pneumococcal vaccine: completed 05/31/2015 Tdap vaccine: completed 10/22/2013, due 10/23/2023 Shingles vaccine: discussed   Covid-19: 08/02/2020, 11/02/2019, 09/29/2019  Advanced directives: copy in chart  Conditions/risks identified: none  Next appointment: Follow up in one year for your annual wellness visit    Preventive Care 24 Years and Older, Female Preventive care refers to lifestyle choices and visits with your health care provider that can promote health and wellness. What does preventive care include?  A yearly physical exam. This is also called an annual well check.  Dental exams once or twice a year.  Routine eye exams. Ask your health care provider how often you should have your eyes checked.  Personal lifestyle choices, including:  Daily care of your teeth and gums.  Regular physical activity.  Eating a healthy diet.  Avoiding tobacco and drug use.  Limiting alcohol use.  Practicing safe sex.  Taking low-dose aspirin every day.  Taking vitamin and mineral supplements as recommended by your health care provider. What happens during an annual well check? The services and screenings done by your health care provider during your annual well check will depend on your age, overall health, lifestyle risk factors, and family history of disease. Counseling  Your health care provider  may ask you questions about your:  Alcohol use.  Tobacco use.  Drug use.  Emotional well-being.  Home and relationship well-being.  Sexual activity.  Eating habits.  History of falls.  Memory and ability to understand (cognition).  Work and work Statistician.  Reproductive health. Screening  You may have the following tests or measurements:  Height, weight, and BMI.  Blood pressure.  Lipid and cholesterol levels. These may be checked every 5 years, or more frequently if you are over 9 years old.  Skin check.  Lung cancer screening. You may have this screening every year starting at age 84 if you have a 30-pack-year history of smoking and currently smoke or have quit within the past 15 years.  Fecal occult blood test (FOBT) of the stool. You may have this test every year starting at age 81.  Flexible sigmoidoscopy or colonoscopy. You may have a sigmoidoscopy every 5 years or a colonoscopy every 10 years starting at age 61.  Hepatitis C blood test.  Hepatitis B blood test.  Sexually transmitted disease (STD) testing.  Diabetes screening. This is done by checking your blood sugar (glucose) after you have not eaten for a while (fasting). You may have this done every 1-3 years.  Bone density scan. This is done to screen for osteoporosis. You may have this done starting at age 61.  Mammogram. This may be done every 1-2 years. Talk to your health care provider about how often you should have regular mammograms. Talk with your health care provider about your test results, treatment options, and if necessary, the need for more tests. Vaccines  Your health care provider may recommend certain vaccines, such as:  Influenza vaccine. This is recommended every year.  Tetanus, diphtheria, and acellular pertussis (Tdap,  Td) vaccine. You may need a Td booster every 10 years.  Zoster vaccine. You may need this after age 48.  Pneumococcal 13-valent conjugate (PCV13) vaccine.  One dose is recommended after age 49.  Pneumococcal polysaccharide (PPSV23) vaccine. One dose is recommended after age 22. Talk to your health care provider about which screenings and vaccines you need and how often you need them. This information is not intended to replace advice given to you by your health care provider. Make sure you discuss any questions you have with your health care provider. Document Released: 09/02/2015 Document Revised: 04/25/2016 Document Reviewed: 06/07/2015 Elsevier Interactive Patient Education  2017 [place] Prevention in the Home Falls can cause injuries. They can happen to people of all ages. There are many things you can do to make your home safe and to help prevent falls. What can I do on the outside of my home?  Regularly fix the edges of walkways and driveways and fix any cracks.  Remove anything that might make you trip as you walk through a door, such as a raised step or threshold.  Trim any bushes or trees on the path to your home.  Use bright outdoor lighting.  Clear any walking paths of anything that might make someone trip, such as rocks or tools.  Regularly check to see if handrails are loose or broken. Make sure that both sides of any steps have handrails.  Any raised decks and porches should have guardrails on the edges.  Have any leaves, snow, or ice cleared regularly.  Use sand or salt on walking paths during winter.  Clean up any spills in your garage right away. This includes oil or grease spills. What can I do in the bathroom?  Use night lights.  Install grab bars by the toilet and in the tub and shower. Do not use towel bars as grab bars.  Use non-skid mats or decals in the tub or shower.  If you need to sit down in the shower, use a plastic, non-slip stool.  Keep the floor dry. Clean up any water that spills on the floor as soon as it happens.  Remove soap buildup in the tub or shower regularly.  Attach bath  mats securely with double-sided non-slip rug tape.  Do not have throw rugs and other things on the floor that can make you trip. What can I do in the bedroom?  Use night lights.  Make sure that you have a light by your bed that is easy to reach.  Do not use any sheets or blankets that are too big for your bed. They should not hang down onto the floor.  Have a firm chair that has side arms. You can use this for support while you get dressed.  Do not have throw rugs and other things on the floor that can make you trip. What can I do in the kitchen?  Clean up any spills right away.  Avoid walking on wet floors.  Keep items that you use a lot in easy-to-reach places.  If you need to reach something above you, use a strong step stool that has a grab bar.  Keep electrical cords out of the way.  Do not use floor polish or wax that makes floors slippery. If you must use wax, use non-skid floor wax.  Do not have throw rugs and other things on the floor that can make you trip. What can I do with my stairs?  Do not  leave any items on the stairs.  Make sure that there are handrails on both sides of the stairs and use them. Fix handrails that are broken or loose. Make sure that handrails are as long as the stairways.  Check any carpeting to make sure that it is firmly attached to the stairs. Fix any carpet that is loose or worn.  Avoid having throw rugs at the top or bottom of the stairs. If you do have throw rugs, attach them to the floor with carpet tape.  Make sure that you have a light switch at the top of the stairs and the bottom of the stairs. If you do not have them, ask someone to add them for you. What else can I do to help prevent falls?  Wear shoes that:  Do not have high heels.  Have rubber bottoms.  Are comfortable and fit you well.  Are closed at the toe. Do not wear sandals.  If you use a stepladder:  Make sure that it is fully opened. Do not climb a closed  stepladder.  Make sure that both sides of the stepladder are locked into place.  Ask someone to hold it for you, if possible.  Clearly mark and make sure that you can see:  Any grab bars or handrails.  First and last steps.  Where the edge of each step is.  Use tools that help you move around (mobility aids) if they are needed. These include:  Canes.  Walkers.  Scooters.  Crutches.  Turn on the lights when you go into a dark area. Replace any light bulbs as soon as they burn out.  Set up your furniture so you have a clear path. Avoid moving your furniture around.  If any of your floors are uneven, fix them.  If there are any pets around you, be aware of where they are.  Review your medicines with your doctor. Some medicines can make you feel dizzy. This can increase your chance of falling. Ask your doctor what other things that you can do to help prevent falls. This information is not intended to replace advice given to you by your health care provider. Make sure you discuss any questions you have with your health care provider. Document Released: 06/02/2009 Document Revised: 01/12/2016 Document Reviewed: 09/10/2014 Elsevier Interactive Patient Education  2017 [employer].

## 2020-09-16 DIAGNOSIS — S72401A Unspecified fracture of lower end of right femur, initial encounter for closed fracture: Secondary | ICD-10-CM | POA: Diagnosis not present

## 2020-09-20 ENCOUNTER — Ambulatory Visit: Payer: Medicare PPO | Admitting: Physical Therapy

## 2020-09-21 ENCOUNTER — Other Ambulatory Visit: Payer: Self-pay | Admitting: Internal Medicine

## 2020-09-22 ENCOUNTER — Ambulatory Visit: Payer: Medicare PPO | Admitting: Physical Therapy

## 2020-09-22 DIAGNOSIS — R54 Age-related physical debility: Secondary | ICD-10-CM | POA: Diagnosis not present

## 2020-09-27 ENCOUNTER — Encounter: Payer: Self-pay | Admitting: Physical Therapy

## 2020-09-27 ENCOUNTER — Other Ambulatory Visit: Payer: Self-pay

## 2020-09-27 ENCOUNTER — Ambulatory Visit: Payer: Medicare PPO | Attending: Internal Medicine | Admitting: Physical Therapy

## 2020-09-27 DIAGNOSIS — M6281 Muscle weakness (generalized): Secondary | ICD-10-CM

## 2020-09-27 DIAGNOSIS — R293 Abnormal posture: Secondary | ICD-10-CM | POA: Insufficient documentation

## 2020-09-27 DIAGNOSIS — R2689 Other abnormalities of gait and mobility: Secondary | ICD-10-CM | POA: Diagnosis not present

## 2020-09-28 NOTE — Therapy (Signed)
[place], [place], [address] Phone: [phone]   Fax:  [phone]  Physical Therapy Treatment  Patient Details  Name: Valerie [name] MRN: [id number] Date of Birth: 11-26-1945 Referring Provider (PT): [name] MD   Encounter Date: 09/27/2020   PT End of Session - 09/27/20 1434    Visit Number 4    Number of Visits 24    Date for PT Re-Evaluation 11/15/20    Authorization Type [employer] MCR  10 VISIT PROGRESS NOTE; KX AT 15    PT Start Time 1330    PT Stop Time 1411    PT Time Calculation (min) 41 min    Activity Tolerance Patient tolerated treatment well    Behavior During Therapy [facility] for tasks assessed/performed           Past Medical History:  Diagnosis Date  . Abnormal Pap smear 2006  . Anal fistula   . Ankle fracture   . Anxiety   . Arthritis   . Atrophic vaginitis 2008  . Cataract   . Dementia ([place]) 2009  . Dyspareunia 2008  . H/O osteoporosis   . H/O varicella   . H/O vitamin D deficiency   . Headache 07/24/2016  . Heart murmur   . History of measles, mumps, or rubella   . History of radiation therapy 07/28/13- 08/10/13   right lung metastasis 5000 cGy 10 sessions  . Hypertension   . Lung cancer ([place]) dx'd 2002  . Lung cancer ([place])   . Lung cancer ([place])   . Lung metastasis ([place])    PET scan 05/05/13, RUL lung nodule  . 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 Nodule of right lung CT- 06/03/12   RIGHT UPPER LOBE  . Nodule of right lung 06/03/12   Upper Lobe  . On antineoplastic chemotherapy    TARCEVA  . Osteoporosis 2010  . Primary cancer of right upper lobe of lung ([place]) 04/22/2009   Qualifier: Diagnosis of  By: Nils Pyle CMA ([place]), Mearl Latin    . Shortness of breath    hx lung ca   . Status post chemotherapy 2003   CARBOPLATIN/PACLITAXEL /STATUS POST CLINICAL TRAIL OF CELEBREX AND IRESSA AT [facility] FOR 1 YEAR  . Status post  radiation therapy 2003   LEFT LUNG  . Status post radiation therapy 11/07/2005   WHOLE BRAIN: DR Larkin [facility]  . Status post radiation therapy 06/02/2008   GAMMA KNIFE OF RESECTED CAVITAY  . Thyroid adenoma    ?  [name] Thyroid cancer ([place]) 10/18/11 bx   adenoid nodules   . Yeast infection     Past Surgical History:  Procedure Laterality Date  . BRAIN SURGERY    . INTRAVASCULAR ULTRASOUND/IVUS N/A 02/16/2020   Procedure: Intravascular Ultrasound/IVUS;  Surgeon: Leonie Man, MD;  Location: [facility];  Service: Cardiovascular;  Laterality: N/A;  . LEFT LOWER LOBECTOMY  05/2001  . LEFT OCCIPITAL CRANIOTOMY  09/21/2005   tumor  . LUNG LOBECTOMY    . NECK SURGERY    . ORIF ANKLE FRACTURE Left 10/02/2014   Procedure: OPEN REDUCTION INTERNAL FIXATION (ORIF) ANKLE FRACTURE;  Surgeon: Alta Corning, MD;  Location: [facility];  Service: Orthopedics;  Laterality: Left;  . ORIF FEMUR FRACTURE Right 06/11/2019   Procedure: OPEN REDUCTION INTERNAL FIXATION (ORIF) DISTAL FEMUR FRACTURE;  Surgeon: Renette Butters, MD;  Location: [facility];  Service: Orthopedics;  Laterality: Right;  . ORIF TIBIA PLATEAU Right 06/11/2019   Procedure: Open Reduction Internal Fixation (Orif) Tibial Plateau;  Surgeon: Renette Butters, MD;  Location: [facility];  Service: Orthopedics;  Laterality: Right;  . RADICAL NECK DISSECTION  10/18/2011   Procedure: RADICAL NECK DISSECTION;  Surgeon: Izora Gala, MD;  Location: [facility];  Service: ENT;  Laterality: Right;  RIGHT MODIFIED NECK DISSECTION /POSSIBLE RIGHT THYROIDECTOM  . RIGHT/LEFT HEART CATH AND CORONARY ANGIOGRAPHY N/A 02/16/2020   Procedure: RIGHT/LEFT HEART CATH AND CORONARY ANGIOGRAPHY;  Surgeon: Leonie Man, MD;  Location: [facility];  Service: Cardiovascular;  Laterality: N/A;  . TEE WITHOUT CARDIOVERSION N/A 03/28/2020   Procedure: TRANSESOPHAGEAL ECHOCARDIOGRAM (TEE);  Surgeon: Buford Dresser, MD;  Location[facility];  Service: Cardiovascular;   Laterality: N/A;  . THYROIDECTOMY  10/18/2011   Procedure: THYROIDECTOMY WITH RADICAL NECK DISSECTION;  Surgeon: Izora Gala, MD;  Location: [facility];  Service: ENT;  Laterality: Right;    There were no vitals filed for this visit.   Subjective Assessment - 09/27/20 1342    Subjective Patient reports she has been feleing OK. Last week they had transportation issues then the therpaist had to cancel. She has been working on her exeercises    Pertinent History History of lung cancer for 18 years. ORIF Rt tibial plateau 06-11-19, recent hospitalization due to cellulitis and sepsis  See medical hx    How long can you sit comfortably? can sit in a wheelchar without difficulty    How long can you stand comfortably? can not stand.    Patient Stated Goals to be able to walk to the bathroom, to be able to transfer , to be able to stand on my own    Currently in Pain? No/denies                             [facility] Adult PT Treatment/Exercise - 09/28/20 0001      Transfers   Comments Patient able to perfrom 2 squat pivot transfers with decreased assist today. On the first trial she became frightened and tensed up but she still was able to compelte 50% of the transfer. On the secoind trial she required min a to squat pivot back to the wheelchair. Sit to stand 2x. On the first trial she required min a to find balance point. Sh emaintained standing for 20 sec. On the second trial she was unable to maintain standing .      Knee/Hip Exercises: Seated   Other Seated Knee/Hip Exercises seated clamshell2 x20 red ; bilateral er 2x10 red ; bilateral horizontal abduction 2x10 red ; bilaterl; scap retraction 2x10 red Seated march 2x10 1lb; bicpe curl 3x10; shoulder press 1 lb 2x10; using her arms to push upfrom her wheel chair 3x10;                  PT Education - 09/27/20 1349    Education Details reviewed technique with a squa pivot transfer;    Person(s) Educated Patient    Methods  Explanation;Tactile cues;Verbal cues;Demonstration    Comprehension Verbalized understanding;Returned demonstration;Verbal cues required;Tactile cues required            PT Short Term Goals - 08/23/20 1428      PT SHORT TERM GOAL #1   Title Patient will transfer sit to stand with min a    Baseline max A on eval    Time 6  Period Weeks    Status New    Target Date 10/04/20      PT SHORT TERM GOAL #2   Title Patient will stand for 60 seconds without significant fatigue    Baseline Pt attempted 35 sec then 15 sec then 15 sec fatigues easily    Time 6    Period Weeks    Status New    Target Date 10/04/20      PT SHORT TERM GOAL #3   Title Patient will use walker to take a step    Baseline Pt unable to take a step at eval    Time 6    Period Weeks    Status New    Target Date 10/04/20      PT SHORT TERM GOAL #4   Title Pt will use sliding board and min A from W/C to mat    Baseline Pt requires mod to Max A due to fatigue and requires extra time to complete movement    Time 6    Period Weeks    Status New    Target Date 10/04/20             PT Long Term Goals - 08/23/20 1753      PT LONG TERM GOAL #1   Title Patient will ambulate 33' with mod A and AD    Time 12    Period Weeks    Status New    Target Date 11/15/20      PT LONG TERM GOAL #2   Title Patient will trnasfer from her wheelchair to a regular chair with min gaurd with walker    Time 12    Period Weeks    Status New    Target Date 11/15/20      PT LONG TERM GOAL #3   Title Patient will stand for 5 minutes in order to perfrom ADL's at the sink    Baseline Eval 35 sec , then 15 sec fatigues easily    Time 12    Status New    Target Date 11/15/20      PT LONG TERM GOAL #4   Title Pt will be able to perform full range bridge in order to perform bed mobility with 50% greater ease    Time 12    Period Weeks    Status New    Target Date 11/15/20                 Plan - 09/27/20 1435     Clinical Impression Statement Patient did the best standing that she has done so far with standing. On her first trial she required min a to find her balance but stood with very little assist. on her second trail she required more assist and was unable to fiund her balance point but she still stood for about 10 seconds. She toleratred ther-ex but reported fatigue after the session.    Personal Factors and Comorbidities Comorbidity 1;Comorbidity 2;Comorbidity 3+    Comorbidities History of lung cancer for 18 years. ORIF Rt tibial plateau 06-11-19, recent hospitalization due to cellulitis and sepsis  See medical hx    Examination-Activity Limitations Bed Mobility;Bathing;Bend;Carry;Lift;Dressing;Locomotion Level;Squat;Stairs;Stand;Toileting;Transfers    Examination-Participation Restrictions Church;Community Activity;Cleaning;Laundry;Medication Management    Stability/Clinical Decision Making Unstable/Unpredictable    Clinical Decision Making High    Rehab Potential Fair    PT Frequency 2x / week    PT Duration 12 weeks    PT Treatment/Interventions ADLs/Self Care Home  Management;Cryotherapy;Gait training;Stair training;Functional mobility training;Therapeutic activities;Therapeutic exercise;Neuromuscular re-education;Patient/family education;Manual techniques    PT Next Visit Plan Pt needs to work on transfers with sliding board for functional strength.  review HEP with resistance (yellow to challenge in sitting)    Consulted and Agree with Plan of Care Patient           Patient will benefit from skilled therapeutic intervention in order to improve the following deficits and impairments:  Abnormal gait,Decreased activity tolerance,Decreased endurance,Difficulty walking,Decreased mobility,Decreased strength,Pain,Postural dysfunction  Visit Diagnosis: Other abnormalities of gait and mobility  Muscle weakness (generalized)  Abnormal posture     Problem List Patient Active Problem List    Diagnosis Date Noted  . Malnutrition of moderate degree 06/13/2020  . Rash and nonspecific skin eruption   . Metastatic malignant neoplasm ([place])   . Failure to thrive in adult   . Asymptomatic bacteriuria   . Pressure injury of skin 06/03/2020  . Cellulitis 06/03/2020  . SVT (supraventricular tachycardia) ([place]) 03/26/2020  . Normocytic anemia 03/24/2020  . Urinary tract infection without hematuria   . AMS (altered mental status) 03/23/2020  . Hypokalemia 03/23/2020  . Prolonged Q-T interval on ECG 03/23/2020  . DNR (do not resuscitate) 03/23/2020  . MRSA bacteremia 03/23/2020  . Acute combined systolic and diastolic heart failure ([place])   . Elevated troponin   . Dilated cardiomyopathy ([place])   . Coronary artery disease involving native coronary artery of native heart without angina pectoris   . Congestive heart failure (CHF) ([place]) 02/14/2020  . Pyelonephritis 09/10/2019  . Acute blood loss anemia 06/12/2019  . Closed fracture of distal end of right femur, initial encounter ([place]) 06/11/2019  . Closed fracture of lateral portion of right tibial plateau 06/11/2019  . History of DVT of lower extremity 06/11/2019  . Blurry vision 01/06/2019  . Headache 07/24/2016  . Cough 08/24/2015  . Chronic diarrhea 08/24/2015  . Sepsis ([place])   . Encounter for antineoplastic chemotherapy 01/31/2015  . Urinary retention 10/20/2014  . Ileus ([place]) 10/20/2014  . Cellulitis of leg, right 10/20/2014  . Fracture of right proximal fibula   . Ankle fracture 10/02/2014  . Ankle fracture 10/02/2014  . Lung cancer ([place]) 10/02/2014  . Closed fibular fracture 10/02/2014  . Dementia ([place]) 10/02/2014  . Lower urinary tract infectious disease 10/02/2014  . Reactive airway disease 10/02/2014  . Fall 10/02/2014  . Closed left ankle fracture 10/01/2014  . Right fibular fracture 10/01/2014  . Lung metastasis ([place])   . LESION, ANUS 04/26/2009  . DIARRHEA 04/26/2009  . Primary cancer of right upper lobe of  lung ([place]) 04/22/2009  . Malignant neoplasm of brain ([place]) 04/22/2009  . VITAMIN D DEFICIENCY 04/22/2009  . ANAL FISTULA 04/22/2009  . OSTEOPOROSIS 04/22/2009    Carney Living PT DPT  09/28/2020, 1:14 PM  [facility] [address] [place], [place], [address] Phone: [phone]   Fax:  [phone]  Name: Valerie [name] MRN: [id number] Date of Birth: 07/29/1946

## 2020-09-29 ENCOUNTER — Ambulatory Visit: Payer: Medicare PPO | Admitting: Physical Therapy

## 2020-09-29 ENCOUNTER — Other Ambulatory Visit: Payer: Self-pay

## 2020-09-29 DIAGNOSIS — R293 Abnormal posture: Secondary | ICD-10-CM | POA: Diagnosis not present

## 2020-09-29 DIAGNOSIS — R2689 Other abnormalities of gait and mobility: Secondary | ICD-10-CM

## 2020-09-29 DIAGNOSIS — M6281 Muscle weakness (generalized): Secondary | ICD-10-CM

## 2020-09-29 MED FILL — ERLOTINIB HCL 100 MG TABS: 100 | 30 days supply | Qty: 30 | Fill #0

## 2020-09-30 NOTE — Therapy (Signed)
[place], [place], [address] Phone: [phone]   Fax:  [phone]  Physical Therapy Treatment  Patient Details  Name: Valerie [name] MRN: [id number] Date of Birth: [birth year] Referring Provider (PT): [name] MD   Encounter Date: 09/29/2020   PT End of Session - 09/30/20 7591    Visit Number 5    Number of Visits 24    Date for PT Re-Evaluation 11/15/20    Authorization Type [employer] MCR  10 VISIT PROGRESS NOTE; KX AT 15    PT Start Time 1330    PT Stop Time 1412    PT Time Calculation (min) 42 min    Activity Tolerance Patient tolerated treatment well    Behavior During Therapy [facility] for tasks assessed/performed           Past Medical History:  Diagnosis Date  . Abnormal Pap smear 2006  . Anal fistula   . Ankle fracture   . Anxiety   . Arthritis   . Atrophic vaginitis 2008  . Cataract   . Dementia ([place]) 2009  . Dyspareunia 2008  . H/O osteoporosis   . H/O varicella   . H/O vitamin D deficiency   . Headache 07/24/2016  . Heart murmur   . History of measles, mumps, or rubella   . History of radiation therapy 07/28/13- 08/10/13   right lung metastasis 5000 cGy 10 sessions  . Hypertension   . Lung cancer ([place]) dx'd 2002  . Lung cancer ([place])   . Lung cancer ([place])   . Lung metastasis ([place])    PET scan 05/05/13, RUL lung nodule  . Metastasis [facility] brain  [facility]) dx'd 2008  . 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 Nodule of right lung CT- 06/03/12   RIGHT UPPER LOBE  . Nodule of right lung 06/03/12   Upper Lobe  . On antineoplastic chemotherapy    TARCEVA  . Osteoporosis 2010  . Primary cancer of right upper lobe of lung ([place]) 04/22/2009   Qualifier: Diagnosis of  By: Nils Pyle CMA ([place]), Mearl Latin    . Shortness of breath    hx lung ca   . Status post chemotherapy 2003   CARBOPLATIN/PACLITAXEL /STATUS POST CLINICAL TRAIL OF CELEBREX AND IRESSA AT [facility] FOR 1 YEAR  . Status post  radiation therapy 2003   LEFT LUNG  . Status post radiation therapy 11/07/2005   WHOLE BRAIN: DR Larkin [facility]  . Status post radiation therapy 06/02/2008   GAMMA KNIFE OF RESECTED CAVITAY  . Thyroid adenoma    ?  [name] Thyroid cancer ([place]) 10/18/11 bx   adenoid nodules   . Yeast infection     Past Surgical History:  Procedure Laterality Date  . BRAIN SURGERY    . INTRAVASCULAR ULTRASOUND/IVUS N/A 02/16/2020   Procedure: Intravascular Ultrasound/IVUS;  Surgeon: Leonie Man, MD;  Location: [place] [facility];  Service: Cardiovascular;  Laterality: N/A;  . LEFT LOWER LOBECTOMY  05/2001  . LEFT OCCIPITAL CRANIOTOMY  09/21/2005   tumor  . LUNG LOBECTOMY    . NECK SURGERY    . ORIF ANKLE FRACTURE Left 10/02/2014   Procedure: OPEN REDUCTION INTERNAL FIXATION (ORIF) ANKLE FRACTURE;  Surgeon: Alta Corning, MD;  Location: [facility];  Service: Orthopedics;  Laterality: Left;  . ORIF FEMUR FRACTURE Right 06/11/2019   Procedure: OPEN REDUCTION INTERNAL FIXATION (ORIF) DISTAL FEMUR FRACTURE;  Surgeon: Renette Butters, MD;  Location: [place];  Service: Orthopedics;  Laterality: Right;  . ORIF TIBIA PLATEAU Right 06/11/2019   Procedure: Open Reduction Internal Fixation (Orif) Tibial Plateau;  Surgeon: Renette Butters, MD;  Location: [place];  Service: Orthopedics;  Laterality: Right;  . RADICAL NECK DISSECTION  10/18/2011   Procedure: RADICAL NECK DISSECTION;  Surgeon: Izora Gala, MD;  Location: [place];  Service: ENT;  Laterality: Right;  RIGHT MODIFIED NECK DISSECTION /POSSIBLE RIGHT THYROIDECTOM  . RIGHT/LEFT HEART CATH AND CORONARY ANGIOGRAPHY N/A 02/16/2020   Procedure: RIGHT/LEFT HEART CATH AND CORONARY ANGIOGRAPHY;  Surgeon: Leonie Man, MD;  Location: [place] [facility];  Service: Cardiovascular;  Laterality: N/A;  . TEE WITHOUT CARDIOVERSION N/A 03/28/2020   Procedure: TRANSESOPHAGEAL ECHOCARDIOGRAM (TEE);  Surgeon: Buford Dresser, MD;  Location: [facility];  Service: Cardiovascular;   Laterality: N/A;  . THYROIDECTOMY  10/18/2011   Procedure: THYROIDECTOMY WITH RADICAL NECK DISSECTION;  Surgeon: Izora Gala, MD;  Location: [place];  Service: ENT;  Laterality: Right;    There were no vitals filed for this visit.   Subjective Assessment - 09/30/20 0818    Subjective Patient had no complaints after the visit earlier this week. she has been feeling pretty good,    Pertinent History History of lung cancer for 18 years. ORIF Rt tibial plateau 06-11-19, recent hospitalization due to cellulitis and sepsis  See medical hx    How long can you sit comfortably? can sit in a wheelchar without difficulty    How long can you stand comfortably? can not stand.    How long can you walk comfortably? can not walk    Diagnostic tests recent hospitalization    Patient Stated Goals to be able to walk to the bathroom, to be able to transfer , to be able to stand on my own    Currently in Pain? No/denies                             [facility] Adult PT Treatment/Exercise - 09/30/20 0001      Transfers   Comments --      Therapeutic Activites    Other Therapeutic Activities Sit to stand transfer 1x 25 seconds Lift tabel at 21 inches.  Scooting sitting on the tablke. Patient had eaiser time scooting to the left. She reports when she lens back on the right it hurts her back. Patient sat at the edge of the tbale for 10 inutes.      Knee/Hip Exercises: Seated   Other Seated Knee/Hip Exercises seated clamshell2 x20 red ; bilateral er 2x10 red ; bilateral horizontal abduction 2x10 red ; bilaterl; scap retraction 2x10 red Seated march 2x10 1lb; bicpe curl 3x10; shoulder press 1 lb 2x10; using her arms to push upfrom her wheel chair 3x10;                  PT Education - 09/30/20 0819    Education Details HEP and symptom mangement    Person(s) Educated Patient    Methods Explanation;Demonstration;Tactile cues;Verbal cues    Comprehension Returned demonstration;Verbalized  understanding;Verbal cues required;Tactile cues required            PT Short Term Goals - 08/23/20 1428      PT SHORT TERM GOAL #1   Title Patient will transfer sit to stand with min a    Baseline max A on eval    Time 6    Period Weeks    Status New  Target Date 10/04/20      PT SHORT TERM GOAL #2   Title Patient will stand for 60 seconds without significant fatigue    Baseline Pt attempted 35 sec then 15 sec then 15 sec fatigues easily    Time 6    Period Weeks    Status New    Target Date 10/04/20      PT SHORT TERM GOAL #3   Title Patient will use walker to take a step    Baseline Pt unable to take a step at eval    Time 6    Period Weeks    Status New    Target Date 10/04/20      PT SHORT TERM GOAL #4   Title Pt will use sliding board and min A from W/C to mat    Baseline Pt requires mod to Max A due to fatigue and requires extra time to complete movement    Time 6    Period Weeks    Status New    Target Date 10/04/20             PT Long Term Goals - 08/23/20 1753      PT LONG TERM GOAL #1   Title Patient will ambulate 53' with mod A and AD    Time 12    Period Weeks    Status New    Target Date 11/15/20      PT LONG TERM GOAL #2   Title Patient will trnasfer from her wheelchair to a regular chair with min gaurd with walker    Time 12    Period Weeks    Status New    Target Date 11/15/20      PT LONG TERM GOAL #3   Title Patient will stand for 5 minutes in order to perfrom ADL's at the sink    Baseline Eval 35 sec , then 15 sec fatigues easily    Time 12    Status New    Target Date 11/15/20      PT LONG TERM GOAL #4   Title Pt will be able to perform full range bridge in order to perform bed mobility with 50% greater ease    Time 12    Period Weeks    Status New    Target Date 11/15/20                 Plan - 09/30/20 7829    Clinical Impression Statement Patient is doing well. She stood for 25 seconds today. She is still  fearful with her wsquat pivot transfer. Sh ewas advised that we will continue to work on this as it may be more realistic that she is able to use this transfer to transfer to a car then a slide board. She tolerated ther-ex well. she was encouraged to sontinue working on her exercises at home.    Personal Factors and Comorbidities Comorbidity 1;Comorbidity 2;Comorbidity 3+    Comorbidities History of lung cancer for 18 years. ORIF Rt tibial plateau 06-11-19, recent hospitalization due to cellulitis and sepsis  See medical hx    Examination-Activity Limitations Bed Mobility;Bathing;Bend;Carry;Lift;Dressing;Locomotion Level;Squat;Stairs;Stand;Toileting;Transfers    Examination-Participation Restrictions Church;Community Activity;Cleaning;Laundry;Medication Management    Stability/Clinical Decision Making Unstable/Unpredictable    Clinical Decision Making High    Rehab Potential Fair    PT Frequency 2x / week    PT Duration 12 weeks    PT Treatment/Interventions ADLs/Self Care Home Management;Cryotherapy;Gait training;Stair training;Functional mobility training;Therapeutic activities;Therapeutic  exercise;Neuromuscular re-education;Patient/family education;Manual techniques    PT Next Visit Plan Pt needs to work on transfers with sliding board for functional strength.  review HEP with resistance (red to challenge in sitting)    PT Home Exercise Plan [id number]    Consulted and Agree with Plan of Care Patient           Patient will benefit from skilled therapeutic intervention in order to improve the following deficits and impairments:  Abnormal gait,Decreased activity tolerance,Decreased endurance,Difficulty walking,Decreased mobility,Decreased strength,Pain,Postural dysfunction  Visit Diagnosis: Other abnormalities of gait and mobility  Muscle weakness (generalized)  Abnormal posture     Problem List Patient Active Problem List   Diagnosis Date Noted  . Malnutrition of moderate degree  06/13/2020  . Rash and nonspecific skin eruption   . Metastatic malignant neoplasm ([place])   . Failure to thrive in adult   . Asymptomatic bacteriuria   . Pressure injury of skin 06/03/2020  . Cellulitis 06/03/2020  . SVT (supraventricular tachycardia) ([place]) 03/26/2020  . Normocytic anemia 03/24/2020  . Urinary tract infection without hematuria   . AMS (altered mental status) 03/23/2020  . Hypokalemia 03/23/2020  . Prolonged Q-T interval on ECG 03/23/2020  . DNR (do not resuscitate) 03/23/2020  . MRSA bacteremia 03/23/2020  . Acute combined systolic and diastolic heart failure ([place])   . Elevated troponin   . Dilated cardiomyopathy ([place])   . Coronary artery disease involving native coronary artery of native heart without angina pectoris   . Congestive heart failure (CHF) ([place]) 02/14/2020  . Pyelonephritis 09/10/2019  . Acute blood loss anemia 06/12/2019  . Closed fracture of distal end of right femur, initial encounter ([place]) 06/11/2019  . Closed fracture of lateral portion of right tibial plateau 06/11/2019  . History of DVT of lower extremity 06/11/2019  . Blurry vision 01/06/2019  . Headache 07/24/2016  . Cough 08/24/2015  . Chronic diarrhea 08/24/2015  . Sepsis ([place])   . Encounter for antineoplastic chemotherapy 01/31/2015  . Urinary retention 10/20/2014  . Ileus ([place]) 10/20/2014  . Cellulitis of leg, right 10/20/2014  . Fracture of right proximal fibula   . Ankle fracture 10/02/2014  . Ankle fracture 10/02/2014  . Lung cancer ([place]) 10/02/2014  . Closed fibular fracture 10/02/2014  . Dementia ([place]) 10/02/2014  . Lower urinary tract infectious disease 10/02/2014  . Reactive airway disease 10/02/2014  . Fall 10/02/2014  . Closed left ankle fracture 10/01/2014  . Right fibular fracture 10/01/2014  . Lung metastasis ([place])   . LESION, ANUS 04/26/2009  . DIARRHEA 04/26/2009  . Primary cancer of right upper lobe of lung ([place]) 04/22/2009  . Malignant neoplasm of brain ([place])  04/22/2009  . VITAMIN D DEFICIENCY 04/22/2009  . ANAL FISTULA 04/22/2009  . OSTEOPOROSIS 04/22/2009    Carney Living 09/30/2020, 8:25 AM  [facility] [address] [place], [place], [address] Phone: [phone]   Fax:  [phone]  Name: TSION INGHRAM MRN: [id number] Date of Birth: 04-Jun-1946

## 2020-10-04 ENCOUNTER — Other Ambulatory Visit: Payer: Self-pay

## 2020-10-04 ENCOUNTER — Ambulatory Visit: Payer: Medicare PPO | Admitting: Physical Therapy

## 2020-10-04 DIAGNOSIS — M6281 Muscle weakness (generalized): Secondary | ICD-10-CM | POA: Diagnosis not present

## 2020-10-04 DIAGNOSIS — R2689 Other abnormalities of gait and mobility: Secondary | ICD-10-CM

## 2020-10-04 DIAGNOSIS — R293 Abnormal posture: Secondary | ICD-10-CM

## 2020-10-04 NOTE — Therapy (Signed)
[place], [place], [address] Phone: [phone]   Fax:  [phone]  Physical Therapy Treatment  Patient Details  Name: Valerie [name] MRN: [id number] Date of Birth: 10/30/1945 Referring Provider (PT): [name] MD   Encounter Date: 10/04/2020   PT End of Session - 10/04/20 1436    Visit Number 6    Number of Visits 24    Date for PT Re-Evaluation 11/15/20    Authorization Type [employer] MCR  10 VISIT PROGRESS NOTE; KX AT 15    PT Start Time 8182    PT Stop Time 1500    PT Time Calculation (min) 40 min    Activity Tolerance Patient tolerated treatment well;Patient limited by fatigue    Behavior During Therapy [facility] for tasks assessed/performed           Past Medical History:  Diagnosis Date  . Abnormal Pap smear 2006  . Anal fistula   . Ankle fracture   . Anxiety   . Arthritis   . Atrophic vaginitis 2008  . Cataract   . Dementia ([place]) 2009  . Dyspareunia 2008  . H/O osteoporosis   . H/O varicella   . H/O vitamin D deficiency   . Headache 07/24/2016  . Heart murmur   . History of measles, mumps, or rubella   . History of radiation therapy 07/28/13- 08/10/13   right lung metastasis 5000 cGy 10 sessions  . Hypertension   . Lung cancer ([place]) dx'd 2002  . Lung cancer ([place])   . Lung cancer ([place])   . Lung metastasis ([place])    PET scan 05/05/13, RUL lung nodule  . Metastasis to brain [facility]) dx'd 2008  . 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 Nodule of right lung CT- 06/03/12   RIGHT UPPER LOBE  . Nodule of right lung 06/03/12   Upper Lobe  . On antineoplastic chemotherapy    TARCEVA  . Osteoporosis 2010  . Primary cancer of right upper lobe of lung ([place]) 04/22/2009   Qualifier: Diagnosis of  By: Nils Pyle CMA ([place]), Mearl Latin    . Shortness of breath    hx lung ca   . Status post chemotherapy 2003   CARBOPLATIN/PACLITAXEL /STATUS POST CLINICAL TRAIL OF CELEBREX AND IRESSA AT [facility]  FOR 1 YEAR  . Status post radiation therapy 2003   LEFT LUNG  . Status post radiation therapy 11/07/2005   WHOLE BRAIN: DR Larkin [facility]  . Status post radiation therapy 06/02/2008   GAMMA KNIFE OF RESECTED CAVITAY  . Thyroid adenoma    ?  [name] Thyroid cancer ([place]) 10/18/11 bx   adenoid nodules   . Yeast infection     Past Surgical History:  Procedure Laterality Date  . BRAIN SURGERY    . INTRAVASCULAR ULTRASOUND/IVUS N/A 02/16/2020   Procedure: Intravascular Ultrasound/IVUS;  Surgeon: Leonie Man, MD;  Location: [place] [facility];  Service: Cardiovascular;  Laterality: N/A;  . LEFT LOWER LOBECTOMY  05/2001  . LEFT OCCIPITAL CRANIOTOMY  09/21/2005   tumor  . LUNG LOBECTOMY    . NECK SURGERY    . ORIF ANKLE FRACTURE Left 10/02/2014   Procedure: OPEN REDUCTION INTERNAL FIXATION (ORIF) ANKLE FRACTURE;  Surgeon: Alta Corning, MD;  Location: [facility];  Service: Orthopedics;  Laterality: Left;  . ORIF FEMUR FRACTURE Right 06/11/2019   Procedure: OPEN REDUCTION INTERNAL FIXATION (ORIF) DISTAL FEMUR FRACTURE;  Surgeon: Renette Butters, MD;  Location: [place];  Service: Orthopedics;  Laterality: Right;  . ORIF TIBIA PLATEAU Right 06/11/2019   Procedure: Open Reduction Internal Fixation (Orif) Tibial Plateau;  Surgeon: Renette Butters, MD;  Location: [place];  Service: Orthopedics;  Laterality: Right;  . RADICAL NECK DISSECTION  10/18/2011   Procedure: RADICAL NECK DISSECTION;  Surgeon: Izora Gala, MD;  Location: [place];  Service: ENT;  Laterality: Right;  RIGHT MODIFIED NECK DISSECTION /POSSIBLE RIGHT THYROIDECTOM  . RIGHT/LEFT HEART CATH AND CORONARY ANGIOGRAPHY N/A 02/16/2020   Procedure: RIGHT/LEFT HEART CATH AND CORONARY ANGIOGRAPHY;  Surgeon: Leonie Man, MD;  Location: [place] [facility];  Service: Cardiovascular;  Laterality: N/A;  . TEE WITHOUT CARDIOVERSION N/A 03/28/2020   Procedure: TRANSESOPHAGEAL ECHOCARDIOGRAM (TEE);  Surgeon: Buford Dresser, MD;  Location: [facility];   Service: Cardiovascular;  Laterality: N/A;  . THYROIDECTOMY  10/18/2011   Procedure: THYROIDECTOMY WITH RADICAL NECK DISSECTION;  Surgeon: Izora Gala, MD;  Location: [place] [facility];  Service: ENT;  Laterality: Right;    There were no vitals filed for this visit.   Subjective Assessment - 10/04/20 1422    Subjective Pt has no complaints  I have a lady who is helping me to exercise 3 x a week.  I am afraid of falling  I am trying to do my exercises    Patient is accompained by: Family member   husband in the lobby   Pertinent History History of lung cancer for 18 years. ORIF Rt tibial plateau 06-11-19, recent hospitalization due to cellulitis and sepsis  See medical hx    How long can you sit comfortably? can sit in a wheelchar without difficulty    How long can you stand comfortably? stands with help    How long can you walk comfortably? walked with help by PT only    Diagnostic tests recent hospitalization    Patient Stated Goals to be able to walk to the bathroom, to be able to transfer , to be able to stand on my own    Currently in Pain? No/denies    Pain Score 0-No pain    Pain Location Generalized                             [facility] Adult PT Treatment/Exercise - 10/04/20 0001      Transfers   Comments Pt was able to transfer x 3 with min A by PT but with VC for bending forward.  Pt tends to push back in chair due to fear. Pt and aide nearby for safety.  transfer 3rd time required mod A due to fatigue.      Ambulation/Gait   Pre-Gait Activities Standing 45 sec  , then 30 sec but then did take steps on 3rd try    Gait Comments Pt requires min A once pt is standing and VC And TC to advance in llbars 3 feet then must rest 5 minutes and then walked 2 feet with min A, but requires mod A to come to stnading and to come to sit safely.      Knee/Hip Exercises: Seated   Other Seated Knee/Hip Exercises seated clamshell2 x20 green,  DF with red t band on L; bilateral er 2x10 red ;  bilateral horizontal abduction 2x10 red ; bil scap retraction 2x10 red Seated march 2x10 , seated resisted LAQ red t band 2 x 10 R and L,  seated ham curls 1 x 15 R and L with red t  band                    PT Short Term Goals - 08/23/20 1428      PT SHORT TERM GOAL #1   Title Patient will transfer sit to stand with min a    Baseline max A on eval    Time 6    Period Weeks    Status New    Target Date 10/04/20      PT SHORT TERM GOAL #2   Title Patient will stand for 60 seconds without significant fatigue    Baseline Pt attempted 35 sec then 15 sec then 15 sec fatigues easily    Time 6    Period Weeks    Status New    Target Date 10/04/20      PT SHORT TERM GOAL #3   Title Patient will use walker to take a step    Baseline Pt unable to take a step at eval    Time 6    Period Weeks    Status New    Target Date 10/04/20      PT SHORT TERM GOAL #4   Title Pt will use sliding board and min A from W/C to mat    Baseline Pt requires mod to Max A due to fatigue and requires extra time to complete movement    Time 6    Period Weeks    Status New    Target Date 10/04/20             PT Long Term Goals - 08/23/20 1753      PT LONG TERM GOAL #1   Title Patient will ambulate 4' with mod A and AD    Time 12    Period Weeks    Status New    Target Date 11/15/20      PT LONG TERM GOAL #2   Title Patient will trnasfer from her wheelchair to a regular chair with min gaurd with walker    Time 12    Period Weeks    Status New    Target Date 11/15/20      PT LONG TERM GOAL #3   Title Patient will stand for 5 minutes in order to perfrom ADL's at the sink    Baseline Eval 35 sec , then 15 sec fatigues easily    Time 12    Status New    Target Date 11/15/20      PT LONG TERM GOAL #4   Title Pt will be able to perform full range bridge in order to perform bed mobility with 50% greater ease    Time 12    Period Weeks    Status New    Target Date 11/15/20                  Plan - 10/04/20 1418    Clinical Impression Statement Pt comes motivated to stand but also fearful of falling.  Pt in W/C and taken to llbar and also initialy transfered and trained with PT providing min to mod assist.  Pt is fearful and was clearly helped with VC and placement of hands on knees to avoid pushing back into chair upon xfers and standing.  Pt expressed fear of falling but did do better as she was encouraged to express her desire to feel in control of the situation.  Pt performed with greater ease in the llbar and clearly was encouraged by  taking her steps.  Pt xfers became more fluid with practice.  Will continue strengthening.   Emphasized to husband importance of daily x fers and exercise for strengthening due to pt easily fatigues and needs ot work on endurance    Personal Factors and Comorbidities Comorbidity 1;Comorbidity 2;Comorbidity 3+    Comorbidities History of lung cancer for 18 years. ORIF Rt tibial plateau 06-11-19, recent hospitalization due to cellulitis and sepsis  See medical hx    Examination-Activity Limitations Bed Mobility;Bathing;Bend;Carry;Lift;Dressing;Locomotion Level;Squat;Stairs;Stand;Toileting;Transfers    Examination-Participation Restrictions Church;Community Activity;Cleaning;Laundry;Medication Management    PT Frequency 2x / week    PT Duration 12 weeks    PT Treatment/Interventions ADLs/Self Care Home Management;Cryotherapy;Gait training;Stair training;Functional mobility training;Therapeutic activities;Therapeutic exercise;Neuromuscular re-education;Patient/family education;Manual techniques    PT Next Visit Plan xfers and standing tolerance, balance in sitting to improve xfers  review HEP with resistance (red to challenge in sitting)    PT Home Exercise Plan [id number]    Consulted and Agree with Plan of Care Patient           Patient will benefit from skilled therapeutic intervention in order to improve the following deficits and  impairments:  Abnormal gait,Decreased activity tolerance,Decreased endurance,Difficulty walking,Decreased mobility,Decreased strength,Pain,Postural dysfunction  Visit Diagnosis: Other abnormalities of gait and mobility  Abnormal posture  Muscle weakness (generalized)     Problem List Patient Active Problem List   Diagnosis Date Noted  . Malnutrition of moderate degree 06/13/2020  . Rash and nonspecific skin eruption   . Metastatic malignant neoplasm ([place])   . Failure to thrive in adult   . Asymptomatic bacteriuria   . Pressure injury of skin 06/03/2020  . Cellulitis 06/03/2020  . SVT (supraventricular tachycardia) ([place]) 03/26/2020  . Normocytic anemia 03/24/2020  . Urinary tract infection without hematuria   . AMS (altered mental status) 03/23/2020  . Hypokalemia 03/23/2020  . Prolonged Q-T interval on ECG 03/23/2020  . DNR (do not resuscitate) 03/23/2020  . MRSA bacteremia 03/23/2020  . Acute combined systolic and diastolic heart failure ([place])   . Elevated troponin   . Dilated cardiomyopathy ([place])   . Coronary artery disease involving native coronary artery of native heart without angina pectoris   . Congestive heart failure (CHF) ([place]) 02/14/2020  . Pyelonephritis 09/10/2019  . Acute blood loss anemia 06/12/2019  . Closed fracture of distal end of right femur, initial encounter ([place]) 06/11/2019  . Closed fracture of lateral portion of right tibial plateau 06/11/2019  . History of DVT of lower extremity 06/11/2019  . Blurry vision 01/06/2019  . Headache 07/24/2016  . Cough 08/24/2015  . Chronic diarrhea 08/24/2015  . Sepsis ([place])   . Encounter for antineoplastic chemotherapy 01/31/2015  . Urinary retention 10/20/2014  . Ileus ([place]) 10/20/2014  . Cellulitis of leg, right 10/20/2014  . Fracture of right proximal fibula   . Ankle fracture 10/02/2014  . Ankle fracture 10/02/2014  . Lung cancer ([place]) 10/02/2014  . Closed fibular fracture 10/02/2014  . Dementia ([place])  10/02/2014  . Lower urinary tract infectious disease 10/02/2014  . Reactive airway disease 10/02/2014  . Fall 10/02/2014  . Closed left ankle fracture 10/01/2014  . Right fibular fracture 10/01/2014  . Lung metastasis ([place])   . LESION, ANUS 04/26/2009  . DIARRHEA 04/26/2009  . Primary cancer of right upper lobe of lung ([place]) 04/22/2009  . Malignant neoplasm of brain ([place]) 04/22/2009  . VITAMIN D DEFICIENCY 04/22/2009  . ANAL FISTULA 04/22/2009  . OSTEOPOROSIS 04/22/2009   Voncille Lo, PT, [place]  Certified Exercise Expert for the Aging Adult  10/04/20 3:43 PM Phone: [phone] Fax: [facility] [address] [place], [place], [address] Phone: [phone]   Fax:  [phone]  Name: Valerie [name] MRN: [id number] Date of Birth: [birth year]

## 2020-10-06 ENCOUNTER — Encounter: Payer: Self-pay | Admitting: Physical Therapy

## 2020-10-06 ENCOUNTER — Ambulatory Visit: Payer: Medicare PPO | Admitting: Physical Therapy

## 2020-10-06 ENCOUNTER — Other Ambulatory Visit: Payer: Self-pay

## 2020-10-06 DIAGNOSIS — M6281 Muscle weakness (generalized): Secondary | ICD-10-CM | POA: Diagnosis not present

## 2020-10-06 DIAGNOSIS — R293 Abnormal posture: Secondary | ICD-10-CM | POA: Diagnosis not present

## 2020-10-06 DIAGNOSIS — R2689 Other abnormalities of gait and mobility: Secondary | ICD-10-CM

## 2020-10-06 NOTE — Therapy (Signed)
[place], [place], [address] Phone: [phone]   Fax:  [phone]  Physical Therapy Treatment  Patient Details  Name: Valerie [name] MRN: [id number] Date of Birth: 1946-08-18 Referring Provider (PT): [name] MD   Encounter Date: 10/06/2020   PT End of Session - 10/06/20 1432    Visit Number 7    Number of Visits 24    Date for PT Re-Evaluation 11/15/20    Authorization Type [employer] MCR  10 VISIT PROGRESS NOTE; KX AT 15    PT Start Time 1330    PT Stop Time 1410    PT Time Calculation (min) 40 min    Activity Tolerance Patient tolerated treatment well;Patient limited by fatigue           Past Medical History:  Diagnosis Date  . Abnormal Pap smear 2006  . Anal fistula   . Ankle fracture   . Anxiety   . Arthritis   . Atrophic vaginitis 2008  . Cataract   . Dementia ([place]) 2009  . Dyspareunia 2008  . H/O osteoporosis   . H/O varicella   . H/O vitamin D deficiency   . Headache 07/24/2016  . Heart murmur   . History of measles, mumps, or rubella   . History of radiation therapy 07/28/13- 08/10/13   right lung metastasis 5000 cGy 10 sessions  . Hypertension   . Lung cancer ([place]) dx'd 2002  . Lung cancer ([place])   . Lung cancer ([place])   . Lung metastasis ([place])    PET scan 05/05/13, RUL lung nodule  . 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 Nodule of right lung CT- 06/03/12   RIGHT UPPER LOBE  . Nodule of right lung 06/03/12   Upper Lobe  . On antineoplastic chemotherapy    TARCEVA  . Osteoporosis 2010  . Primary cancer of right upper lobe of lung ([place]) 04/22/2009   Qualifier: Diagnosis of  By: Nils Pyle CMA ([place]), Mearl Latin    . Shortness of breath    hx lung ca   . Status post chemotherapy 2003   CARBOPLATIN/PACLITAXEL /STATUS POST CLINICAL TRAIL OF CELEBREX AND IRESSA AT [facility] FOR 1 YEAR  . Status post radiation therapy 2003   LEFT  LUNG  . Status post radiation therapy 11/07/2005   WHOLE BRAIN: DR Larkin [facility]  . Status post radiation therapy 06/02/2008   GAMMA KNIFE OF RESECTED CAVITAY  . Thyroid adenoma    ?  [name] Thyroid cancer ([place]) 10/18/11 bx   adenoid nodules   . Yeast infection     Past Surgical History:  Procedure Laterality Date  . BRAIN SURGERY    . INTRAVASCULAR ULTRASOUND/IVUS N/A 02/16/2020   Procedure: Intravascular Ultrasound/IVUS;  Surgeon: Leonie Man, MD;  Location: [place] [facility] [facility];  Service: Cardiovascular;  Laterality: N/A;  . LEFT LOWER LOBECTOMY  05/2001  . LEFT OCCIPITAL CRANIOTOMY  09/21/2005   tumor  . LUNG LOBECTOMY    . NECK SURGERY    . ORIF ANKLE FRACTURE Left 10/02/2014   Procedure: OPEN REDUCTION INTERNAL FIXATION (ORIF) ANKLE FRACTURE;  Surgeon: Alta Corning, MD;  Location: [facility];  Service: Orthopedics;  Laterality: Left;  . ORIF FEMUR FRACTURE Right 06/11/2019   Procedure: OPEN REDUCTION INTERNAL FIXATION (ORIF) DISTAL FEMUR FRACTURE;  Surgeon: Renette Butters, MD;  Location: [place] [facility];  Service: Orthopedics;  Laterality: Right;  . ORIF TIBIA PLATEAU  Right 06/11/2019   Procedure: Open Reduction Internal Fixation (Orif) Tibial Plateau;  Surgeon: Renette Butters, MD;  Location: [facility];  Service: Orthopedics;  Laterality: Right;  . RADICAL NECK DISSECTION  10/18/2011   Procedure: RADICAL NECK DISSECTION;  Surgeon: Izora Gala, MD;  Location: [facility];  Service: ENT;  Laterality: Right;  RIGHT MODIFIED NECK DISSECTION /POSSIBLE RIGHT THYROIDECTOM  . RIGHT/LEFT HEART CATH AND CORONARY ANGIOGRAPHY N/A 02/16/2020   Procedure: RIGHT/LEFT HEART CATH AND CORONARY ANGIOGRAPHY;  Surgeon: Leonie Man, MD;  Location: [facility];  Service: Cardiovascular;  Laterality: N/A;  . TEE WITHOUT CARDIOVERSION N/A 03/28/2020   Procedure: TRANSESOPHAGEAL ECHOCARDIOGRAM (TEE);  Surgeon: Buford Dresser, MD;  [facility];  Service: Cardiovascular;  Laterality: N/A;  . THYROIDECTOMY   10/18/2011   Procedure: THYROIDECTOMY WITH RADICAL NECK DISSECTION;  Surgeon: Izora Gala, MD;  Location: [place];  Service: ENT;  Laterality: Right;    There were no vitals filed for this visit.   Subjective Assessment - 10/06/20 1430    Subjective Patient has no complaints today. She reports she was tired after the last visit but not too bad. She is having no pain.    Pertinent History History of lung cancer for 18 years. ORIF Rt tibial plateau 06-11-19, recent hospitalization due to cellulitis and sepsis  See medical hx    How long can you sit comfortably? can sit in a wheelchar without difficulty    How long can you stand comfortably? stands with help    How long can you walk comfortably? walked with help by PT only    Diagnostic tests recent hospitalization    Patient Stated Goals to be able to walk to the bathroom, to be able to transfer , to be able to stand on my own    Currently in Pain? No/denies                             [facility] Adult PT Treatment/Exercise - 10/06/20 0001      Ambulation/Gait   Gait Comments Sit to stand 4 3x  3x with mod a and mod TC's She was able to ambualte 4' 5' and 4' with UE support in II bars. she fatigued quickly but this is great progress.      Knee/Hip Exercises: Seated   Long Arc Quad Limitations 3x10 1lb    Heel Slides Limitations hamstirng curl red 2x10 each leg    Other Seated Knee/Hip Exercises seated clamshell x20 yellow ; bilateral er 2x10 yellow; bilateral horizontal abduction 2x10 yellow; bilaterl; scap retraction 2x10 red Seated march 2x10 1lb      Shoulder Exercises: Seated   Other Seated Exercises horizontal abduction 2x10 yellow; bilateral er 2x10 yellow; bilateral shoulder flexion with yellow 2x10 ; triceps extension red x20    Other Seated Exercises biceps curls 2x15 2lb                  PT Education - 10/06/20 1431    Education Details progression of exercises at home    Person(s) Educated Patient     Methods Explanation;Demonstration;Tactile cues;Verbal cues    Comprehension Verbalized understanding;Returned demonstration;Verbal cues required;Tactile cues required            PT Short Term Goals - 08/23/20 1428      PT SHORT TERM GOAL #1   Title Patient will transfer sit to stand with min a    Baseline max A  on eval    Time 6    Period Weeks    Status New    Target Date 10/04/20      PT SHORT TERM GOAL #2   Title Patient will stand for 60 seconds without significant fatigue    Baseline Pt attempted 35 sec then 15 sec then 15 sec fatigues easily    Time 6    Period Weeks    Status New    Target Date 10/04/20      PT SHORT TERM GOAL #3   Title Patient will use walker to take a step    Baseline Pt unable to take a step at eval    Time 6    Period Weeks    Status New    Target Date 10/04/20      PT SHORT TERM GOAL #4   Title Pt will use sliding board and min A from W/C to mat    Baseline Pt requires mod to Max A due to fatigue and requires extra time to complete movement    Time 6    Period Weeks    Status New    Target Date 10/04/20             PT Long Term Goals - 08/23/20 1753      PT LONG TERM GOAL #1   Title Patient will ambulate 2' with mod A and AD    Time 12    Period Weeks    Status New    Target Date 11/15/20      PT LONG TERM GOAL #2   Title Patient will trnasfer from her wheelchair to a regular chair with min gaurd with walker    Time 12    Period Weeks    Status New    Target Date 11/15/20      PT LONG TERM GOAL #3   Title Patient will stand for 5 minutes in order to perfrom ADL's at the sink    Baseline Eval 35 sec , then 15 sec fatigues easily    Time 12    Status New    Target Date 11/15/20      PT LONG TERM GOAL #4   Title Pt will be able to perform full range bridge in order to perform bed mobility with 50% greater ease    Time 12    Period Weeks    Status New    Target Date 11/15/20                 Plan - 10/06/20  1432    Clinical Impression Statement Patient was able to stand and take steps again today. She increased her gait distance by a foot on the second trial. She was fatigued after treatment but was able to complete all activity asked of her. Therapy advanced her weight form seated marching and LAQ. Therapy will continue to advance as tolerated.    Personal Factors and Comorbidities Comorbidity 1;Comorbidity 2;Comorbidity 3+    Comorbidities History of lung cancer for 18 years. ORIF Rt tibial plateau 06-11-19, recent hospitalization due to cellulitis and sepsis  See medical hx    Examination-Activity Limitations Bed Mobility;Bathing;Bend;Carry;Lift;Dressing;Locomotion Level;Squat;Stairs;Stand;Toileting;Transfers    Examination-Participation Restrictions Church;Community Activity;Cleaning;Laundry;Medication Management    Stability/Clinical Decision Making Unstable/Unpredictable    Clinical Decision Making High    Rehab Potential Fair    PT Frequency 2x / week    PT Duration 12 weeks    PT Treatment/Interventions ADLs/Self Care Home Management;Cryotherapy;Gait  training;Stair training;Functional mobility training;Therapeutic activities;Therapeutic exercise;Neuromuscular re-education;Patient/family education;Manual techniques    PT Next Visit Plan xfers and standing tolerance, balance in sitting to improve xfers  review HEP with resistance (red to challenge in sitting)    PT Home Exercise Plan [id number]    Consulted and Agree with Plan of Care Patient           Patient will benefit from skilled therapeutic intervention in order to improve the following deficits and impairments:  Abnormal gait,Decreased activity tolerance,Decreased endurance,Difficulty walking,Decreased mobility,Decreased strength,Pain,Postural dysfunction  Visit Diagnosis: Other abnormalities of gait and mobility  Abnormal posture  Muscle weakness (generalized)     Problem List Patient Active Problem List   Diagnosis Date  Noted  . Malnutrition of moderate degree 06/13/2020  . Rash and nonspecific skin eruption   . Metastatic malignant neoplasm ([place])   . Failure to thrive in adult   . Asymptomatic bacteriuria   . Pressure injury of skin 06/03/2020  . Cellulitis 06/03/2020  . SVT (supraventricular tachycardia) ([place]) 03/26/2020  . Normocytic anemia 03/24/2020  . Urinary tract infection without hematuria   . AMS (altered mental status) 03/23/2020  . Hypokalemia 03/23/2020  . Prolonged Q-T interval on ECG 03/23/2020  . DNR (do not resuscitate) 03/23/2020  . MRSA bacteremia 03/23/2020  . Acute combined systolic and diastolic heart failure ([place])   . Elevated troponin   . Dilated cardiomyopathy ([place])   . Coronary artery disease involving native coronary artery of native heart without angina pectoris   . Congestive heart failure (CHF) ([place]) 02/14/2020  . Pyelonephritis 09/10/2019  . Acute blood loss anemia 06/12/2019  . Closed fracture of distal end of right femur, initial encounter ([place]) 06/11/2019  . Closed fracture of lateral portion of right tibial plateau 06/11/2019  . History of DVT of lower extremity 06/11/2019  . Blurry vision 01/06/2019  . Headache 07/24/2016  . Cough 08/24/2015  . Chronic diarrhea 08/24/2015  . Sepsis ([place])   . Encounter for antineoplastic chemotherapy 01/31/2015  . Urinary retention 10/20/2014  . Ileus ([place]) 10/20/2014  . Cellulitis of leg, right 10/20/2014  . Fracture of right proximal fibula   . Ankle fracture 10/02/2014  . Ankle fracture 10/02/2014  . Lung cancer ([place]) 10/02/2014  . Closed fibular fracture 10/02/2014  . Dementia ([place]) 10/02/2014  . Lower urinary tract infectious disease 10/02/2014  . Reactive airway disease 10/02/2014  . Fall 10/02/2014  . Closed left ankle fracture 10/01/2014  . Right fibular fracture 10/01/2014  . Lung metastasis ([place])   . LESION, ANUS 04/26/2009  . DIARRHEA 04/26/2009  . Primary cancer of right upper lobe of lung ([place])  04/22/2009  . Malignant neoplasm of brain ([place]) 04/22/2009  . VITAMIN D DEFICIENCY 04/22/2009  . ANAL FISTULA 04/22/2009  . OSTEOPOROSIS 04/22/2009    Carney Living PT DPT  10/06/2020, 2:35 PM  [facility] [address] [place], [place], [address] Phone: [phone]   Fax:  [phone]  Name: STEFFANY SCHOENFELDER MRN: [id number] Date of Birth: [birth year]

## 2020-10-11 ENCOUNTER — Ambulatory Visit: Payer: Medicare PPO | Admitting: Physical Therapy

## 2020-10-11 ENCOUNTER — Other Ambulatory Visit: Payer: Self-pay

## 2020-10-11 ENCOUNTER — Encounter: Payer: Self-pay | Admitting: Physical Therapy

## 2020-10-11 DIAGNOSIS — R293 Abnormal posture: Secondary | ICD-10-CM

## 2020-10-11 DIAGNOSIS — R2689 Other abnormalities of gait and mobility: Secondary | ICD-10-CM | POA: Diagnosis not present

## 2020-10-11 DIAGNOSIS — M6281 Muscle weakness (generalized): Secondary | ICD-10-CM

## 2020-10-11 NOTE — Patient Instructions (Signed)
   Voncille Lo, PT, [place] Certified Exercise Expert for the Aging Adult  10/11/20 3:21 PM Phone: [phone] Fax: [phone]

## 2020-10-11 NOTE — Therapy (Signed)
[place], [place], [address] Phone: [phone]   Fax:  [phone]  Physical Therapy Treatment  Patient Details  Name: Valerie [name] MRN: [id number] Date of Birth: Sep 04, 1945 Referring Provider (PT): [name] MD   Encounter Date: 10/11/2020   PT End of Session - 10/11/20 1500    Visit Number 8    Number of Visits 24    Date for PT Re-Evaluation 11/15/20    Authorization Type [employer] MCR  10 VISIT PROGRESS NOTE; KX AT 15    PT Start Time 1500    PT Stop Time 1540    PT Time Calculation (min) 40 min    Activity Tolerance Patient tolerated treatment well;Patient limited by fatigue    Behavior During Therapy [facility] for tasks assessed/performed           Past Medical History:  Diagnosis Date  . Abnormal Pap smear 2006  . Anal fistula   . Ankle fracture   . Anxiety   . Arthritis   . Atrophic vaginitis 2008  . Cataract   . Dementia ([place]) 2009  . Dyspareunia 2008  . H/O osteoporosis   . H/O varicella   . H/O vitamin D deficiency   . Headache 07/24/2016  . Heart murmur   . History of measles, mumps, or rubella   . History of radiation therapy 07/28/13- 08/10/13   right lung metastasis 5000 cGy 10 sessions  . Hypertension   . Lung cancer ([place]) dx'd 2002  . Lung cancer ([place])   . Lung cancer ([place])   . Lung metastasis ([place])    PET scan 05/05/13, RUL lung nodule  . 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 Nodule of right lung CT- 06/03/12   RIGHT UPPER LOBE  . Nodule of right lung 06/03/12   Upper Lobe  . On antineoplastic chemotherapy    TARCEVA  . Osteoporosis 2010  . Primary cancer of right upper lobe of lung ([place]) 04/22/2009   Qualifier: Diagnosis of  By: Nils Pyle CMA ([place]), Mearl Latin    . Shortness of breath    hx lung ca   . Status post chemotherapy 2003   CARBOPLATIN/PACLITAXEL /STATUS POST CLINICAL TRAIL OF CELEBREX AND IRESSA AT [facility]  FOR 1 YEAR  . Status post radiation therapy 2003   LEFT LUNG  . Status post radiation therapy 11/07/2005   WHOLE BRAIN: DR Larkin [facility]  . Status post radiation therapy 06/02/2008   GAMMA KNIFE OF RESECTED CAVITAY  . Thyroid adenoma    ?  [name] Thyroid cancer ([place]) 10/18/11 bx   adenoid nodules   . Yeast infection     Past Surgical History:  Procedure Laterality Date  . BRAIN SURGERY    . INTRAVASCULAR ULTRASOUND/IVUS N/A 02/16/2020   Procedure: Intravascular Ultrasound/IVUS;  Surgeon: Leonie Man, MD;  Location: [facility];  Service: Cardiovascular;  Laterality: N/A;  . LEFT LOWER LOBECTOMY  05/2001  . LEFT OCCIPITAL CRANIOTOMY  09/21/2005   tumor  . LUNG LOBECTOMY    . NECK SURGERY    . ORIF ANKLE FRACTURE Left 10/02/2014   Procedure: OPEN REDUCTION INTERNAL FIXATION (ORIF) ANKLE FRACTURE;  Surgeon: Alta Corning, MD;  Location: [facility];  Service: Orthopedics;  Laterality: Left;  . ORIF FEMUR FRACTURE Right 06/11/2019   Procedure: OPEN REDUCTION INTERNAL FIXATION (ORIF) DISTAL FEMUR FRACTURE;  Surgeon: Renette Butters, MD;  Location: [facility];  Service: Orthopedics;  Laterality: Right;  . ORIF TIBIA PLATEAU Right 06/11/2019   Procedure: Open Reduction Internal Fixation (Orif) Tibial Plateau;  Surgeon: Renette Butters, MD;  Location: [facility];  Service: Orthopedics;  Laterality: Right;  . RADICAL NECK DISSECTION  10/18/2011   Procedure: RADICAL NECK DISSECTION;  Surgeon: Izora Gala, MD;  Location: [facility];  Service: ENT;  Laterality: Right;  RIGHT MODIFIED NECK DISSECTION /POSSIBLE RIGHT THYROIDECTOM  . RIGHT/LEFT HEART CATH AND CORONARY ANGIOGRAPHY N/A 02/16/2020   Procedure: RIGHT/LEFT HEART CATH AND CORONARY ANGIOGRAPHY;  Surgeon: Leonie Man, MD;  Location: [facility];  Service: Cardiovascular;  Laterality: N/A;  . TEE WITHOUT CARDIOVERSION N/A 03/28/2020   Procedure: TRANSESOPHAGEAL ECHOCARDIOGRAM (TEE);  Surgeon: Buford Dresser, MD;  Location: [facility];   Service: Cardiovascular;  Laterality: N/A;  . THYROIDECTOMY  10/18/2011   Procedure: THYROIDECTOMY WITH RADICAL NECK DISSECTION;  Surgeon: Izora Gala, MD;  Location: [facility];  Service: ENT;  Laterality: Right;    There were no vitals filed for this visit.   Subjective Assessment - 10/11/20 1704    Subjective I get so fatigued   I  dont want to transfer from the w/c to the chair.  I get to tired    Pertinent History History of lung cancer for 18 years. ORIF Rt tibial plateau 06-11-19, recent hospitalization due to cellulitis and sepsis  See medical hx    Patient Stated Goals to be able to walk to the bathroom, to be able to transfer , to be able to stand on my own    Currently in Pain? No/denies    Pain Score 0-No pain                             [facility] Adult PT Treatment/Exercise - 10/11/20 0001      Transfers   Transfer Cueing mod A    Comments sit to stand x 4 with mod A and mod cues. Pt tends to push posteriorly and expends energy rather than unweighting by moving anteriorly in seat.  transfer wtih mod A and use of walker x 20 to right .  Pt fatigues quickly      Ambulation/Gait   Pre-Gait Activities standing 64 seconds.  walked for 8 feet in llbars, rest then 8 feet again and ending with 7 feet in llbars. with bil UE support  Pt tends to need close W/C because she descends rapidly    Gait Comments --      Knee/Hip Exercises: Seated   Long Arc Quad Limitations 3x10 1lb    Heel Slides Limitations hamstirng curl red 2x10 each leg    Other Seated Knee/Hip Exercises seated clamshel with red t band and resisted marching with red t band 2 x 10      Shoulder Exercises: Seated   Other Seated Exercises horizontal abduction 2x10 red  bilateral er 2x10 yellow; bilateral shoulder flexion with yellow 2x10 ; triceps extension red x20                  PT Education - 10/11/20 1544    Education Details added resisted seated hip flexion.  Use of walker for transfers     Person(s) Educated Patient    Methods Explanation;Demonstration;Tactile cues;Verbal cues;Handout    Comprehension Verbalized understanding;Returned demonstration            PT Short Term Goals - 10/11/20 1716      PT SHORT  TERM GOAL #1   Title Patient will transfer sit to stand with min a    Baseline mod A    Time 6    Period Weeks    Status On-going      PT SHORT TERM GOAL #2   Title Patient will stand for 60 seconds without significant fatigue    Baseline 64 seconds but fatigues rapidly    Time 6    Period Weeks    Status On-going      PT SHORT TERM GOAL #3   Title Patient will use walker to take a step    Baseline pt in llbars now    Time 6    Period Weeks    Status On-going      PT SHORT TERM GOAL #4   Title Pt will use sliding board and min A from W/C to mat    Baseline Pt xfers with modA but fatigues easily and pushes posteriorly for inefficient x fer    Time 6    Period Weeks    Status On-going             PT Long Term Goals - 08/23/20 1753      PT LONG TERM GOAL #1   Title Patient will ambulate 34' with mod A and AD    Time 12    Period Weeks    Status New    Target Date 11/15/20      PT LONG TERM GOAL #2   Title Patient will trnasfer from her wheelchair to a regular chair with min gaurd with walker    Time 12    Period Weeks    Status New    Target Date 11/15/20      PT LONG TERM GOAL #3   Title Patient will stand for 5 minutes in order to perfrom ADL's at the sink    Baseline Eval 35 sec , then 15 sec fatigues easily    Time 12    Status New    Target Date 11/15/20      PT LONG TERM GOAL #4   Title Pt will be able to perform full range bridge in order to perform bed mobility with 50% greater ease    Time 12    Period Weeks    Status New    Target Date 11/15/20                 Plan - 10/11/20 1513    Clinical Impression Statement Pt appeared more fatigued today but was able to participate.  Pt uses a lot of extra energy  transferring due to pt pushes posteriorly instead of unweighting her buttocks with anterior  lean.  She is very fearful of falling and recalls her fall of a couple of years ago.  Pt is closely watched and is followed closely by a tech and w/c in [place] but was able to walk today with UE support  and help of PT Pt was able to stand for 64 seconds and is budilding endurance needed to complete goal of transferring.   At end of session she requested not to work on transfers form chair to [place] .  Pt would benefit from mat work to progress to a smoother transfer and should be able to complete as she is able to increease standing time and walking ability.   Will continue POC    Personal Factors and Comorbidities Comorbidity 1;Comorbidity 2;Comorbidity 3+    Comorbidities History of lung  cancer for 18 years. ORIF Rt tibial plateau 06-11-19, recent hospitalization due to cellulitis and sepsis  See medical hx    Examination-Activity Limitations Bed Mobility;Bathing;Bend;Carry;Lift;Dressing;Locomotion Level;Squat;Stairs;Stand;Toileting;Transfers    PT Treatment/Interventions ADLs/Self Care Home Management;Cryotherapy;Gait training;Stair training;Functional mobility training;Therapeutic activities;Therapeutic exercise;Neuromuscular re-education;Patient/family education;Manual techniques    PT Next Visit Plan xfers and standing tolerance, balance in sitting to improve xfers  review HEP with resistance (red to challenge in sitting)    PT Home Exercise Plan [id number]    Consulted and Agree with Plan of Care Patient           Patient will benefit from skilled therapeutic intervention in order to improve the following deficits and impairments:  Abnormal gait,Decreased activity tolerance,Decreased endurance,Difficulty walking,Decreased mobility,Decreased strength,Pain,Postural dysfunction  Visit Diagnosis: Other abnormalities of gait and mobility  Abnormal posture  Muscle weakness (generalized)     Problem  List Patient Active Problem List   Diagnosis Date Noted  . Malnutrition of moderate degree 06/13/2020  . Rash and nonspecific skin eruption   . Metastatic malignant neoplasm ([place])   . Failure to thrive in adult   . Asymptomatic bacteriuria   . Pressure injury of skin 06/03/2020  . Cellulitis 06/03/2020  . SVT (supraventricular tachycardia) ([place]) 03/26/2020  . Normocytic anemia 03/24/2020  . Urinary tract infection without hematuria   . AMS (altered mental status) 03/23/2020  . Hypokalemia 03/23/2020  . Prolonged Q-T interval on ECG 03/23/2020  . DNR (do not resuscitate) 03/23/2020  . MRSA bacteremia 03/23/2020  . Acute combined systolic and diastolic heart failure ([place])   . Elevated troponin   . Dilated cardiomyopathy ([place])   . Coronary artery disease involving native coronary artery of native heart without angina pectoris   . Congestive heart failure (CHF) ([place]) 02/14/2020  . Pyelonephritis 09/10/2019  . Acute blood loss anemia 06/12/2019  . Closed fracture of distal end of right femur, initial encounter ([place]) 06/11/2019  . Closed fracture of lateral portion of right tibial plateau 06/11/2019  . History of DVT of lower extremity 06/11/2019  . Blurry vision 01/06/2019  . Headache 07/24/2016  . Cough 08/24/2015  . Chronic diarrhea 08/24/2015  . Sepsis ([place])   . Encounter for antineoplastic chemotherapy 01/31/2015  . Urinary retention 10/20/2014  . Ileus ([place]) 10/20/2014  . Cellulitis of leg, right 10/20/2014  . Fracture of right proximal fibula   . Ankle fracture 10/02/2014  . Ankle fracture 10/02/2014  . Lung cancer ([place]) 10/02/2014  . Closed fibular fracture 10/02/2014  . Dementia ([place]) 10/02/2014  . Lower urinary tract infectious disease 10/02/2014  . Reactive airway disease 10/02/2014  . Fall 10/02/2014  . Closed left ankle fracture 10/01/2014  . Right fibular fracture 10/01/2014  . Lung metastasis ([place])   . LESION, ANUS 04/26/2009  . DIARRHEA 04/26/2009  .  Primary cancer of right upper lobe of lung ([place]) 04/22/2009  . Malignant neoplasm of brain ([place]) 04/22/2009  . VITAMIN D DEFICIENCY 04/22/2009  . ANAL FISTULA 04/22/2009  . OSTEOPOROSIS 04/22/2009   Voncille Lo, PT, [place] Certified Exercise Expert for the Aging Adult  10/11/20 5:18 PM Phone: [phone] Fax: [facility] [address] [place], [place], [address] Phone: [phone]   Fax:  [phone]  Name: Valerie [name] MRN: [id number] Date of Birth: [birth year]

## 2020-10-12 ENCOUNTER — Other Ambulatory Visit: Payer: Self-pay

## 2020-10-12 DIAGNOSIS — Z86718 Personal history of other venous thrombosis and embolism: Secondary | ICD-10-CM

## 2020-10-12 MED ORDER — XARELTO 20 MG PO TABS: 20.0000 mg | ORAL_TABLET | Freq: Every evening | ORAL | 0 refills | Status: DC

## 2020-10-13 ENCOUNTER — Encounter: Payer: Self-pay | Admitting: Physical Therapy

## 2020-10-13 ENCOUNTER — Ambulatory Visit: Payer: Medicare PPO | Admitting: Physical Therapy

## 2020-10-13 ENCOUNTER — Other Ambulatory Visit: Payer: Self-pay

## 2020-10-13 DIAGNOSIS — R2689 Other abnormalities of gait and mobility: Secondary | ICD-10-CM | POA: Diagnosis not present

## 2020-10-13 DIAGNOSIS — M6281 Muscle weakness (generalized): Secondary | ICD-10-CM | POA: Diagnosis not present

## 2020-10-13 DIAGNOSIS — R293 Abnormal posture: Secondary | ICD-10-CM

## 2020-10-14 ENCOUNTER — Encounter: Payer: Self-pay | Admitting: Physical Therapy

## 2020-10-14 NOTE — Therapy (Signed)
[place], [place], [address] Phone: [phone]   Fax:  [phone]  Physical Therapy Treatment  Patient Details  Name: Valerie [name] MRN: [id number] Date of Birth: Oct 31, 1945 Referring Provider (PT): [name] MD   Encounter Date: 10/13/2020   PT End of Session - 10/14/20 0959    Visit Number 9    Number of Visits 24    Date for PT Re-Evaluation 11/15/20    Authorization Type [employer] MCR  10 VISIT PROGRESS NOTE; KX AT 15    PT Start Time 1330    PT Stop Time 1412    PT Time Calculation (min) 42 min    Activity Tolerance Patient tolerated treatment well;Patient limited by fatigue    Behavior During Therapy [facility] for tasks assessed/performed           Past Medical History:  Diagnosis Date  . Abnormal Pap smear 2006  . Anal fistula   . Ankle fracture   . Anxiety   . Arthritis   . Atrophic vaginitis 2008  . Cataract   . Dementia ([place]) 2009  . Dyspareunia 2008  . H/O osteoporosis   . H/O varicella   . H/O vitamin D deficiency   . Headache 07/24/2016  . Heart murmur   . History of measles, mumps, or rubella   . History of radiation therapy 07/28/13- 08/10/13   right lung metastasis 5000 cGy 10 sessions  . Hypertension   . Lung cancer ([place]) dx'd 2002  . Lung cancer ([place])   . Lung cancer ([place])   . Lung metastasis ([place])    PET scan 05/05/13, RUL lung nodule  . 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 Nodule of right lung CT- 06/03/12   RIGHT UPPER LOBE  . Nodule of right lung 06/03/12   Upper Lobe  . On antineoplastic chemotherapy    TARCEVA  . Osteoporosis 2010  . Primary cancer of right upper lobe of lung ([place]) 04/22/2009   Qualifier: Diagnosis of  By: Nils Pyle CMA ([place]), Mearl Latin    . Shortness of breath    hx lung ca   . Status post chemotherapy 2003   CARBOPLATIN/PACLITAXEL /STATUS POST CLINICAL TRAIL OF CELEBREX AND IRESSA AT [facility]  FOR 1 YEAR  . Status post radiation therapy 2003   LEFT LUNG  . Status post radiation therapy 11/07/2005   WHOLE BRAIN: DR Larkin [facility]  . Status post radiation therapy 06/02/2008   GAMMA KNIFE OF RESECTED CAVITAY  . Thyroid adenoma    ?  [name] Thyroid cancer ([place]) 10/18/11 bx   adenoid nodules   . Yeast infection     Past Surgical History:  Procedure Laterality Date  . BRAIN SURGERY    . INTRAVASCULAR ULTRASOUND/IVUS N/A 02/16/2020   Procedure: Intravascular Ultrasound/IVUS;  Surgeon: Leonie Man, MD;  Location: [place] [facility];  Service: Cardiovascular;  Laterality: N/A;  . LEFT LOWER LOBECTOMY  05/2001  . LEFT OCCIPITAL CRANIOTOMY  09/21/2005   tumor  . LUNG LOBECTOMY    . NECK SURGERY    . ORIF ANKLE FRACTURE Left 10/02/2014   Procedure: OPEN REDUCTION INTERNAL FIXATION (ORIF) ANKLE FRACTURE;  Surgeon: Alta Corning, MD;  Location: [facility];  Service: Orthopedics;  Laterality: Left;  . ORIF FEMUR FRACTURE Right 06/11/2019   Procedure: OPEN REDUCTION INTERNAL FIXATION (ORIF) DISTAL FEMUR FRACTURE;  Surgeon: Renette Butters, MD;  Location: [place];  Service: Orthopedics;  Laterality: Right;  . ORIF TIBIA PLATEAU Right 06/11/2019   Procedure: Open Reduction Internal Fixation (Orif) Tibial Plateau;  Surgeon: Renette Butters, MD;  Location: [place];  Service: Orthopedics;  Laterality: Right;  . RADICAL NECK DISSECTION  10/18/2011   Procedure: RADICAL NECK DISSECTION;  Surgeon: Izora Gala, MD;  Location: [place];  Service: ENT;  Laterality: Right;  RIGHT MODIFIED NECK DISSECTION /POSSIBLE RIGHT THYROIDECTOM  . RIGHT/LEFT HEART CATH AND CORONARY ANGIOGRAPHY N/A 02/16/2020   Procedure: RIGHT/LEFT HEART CATH AND CORONARY ANGIOGRAPHY;  Surgeon: Leonie Man, MD;  Location: [place] [facility];  Service: Cardiovascular;  Laterality: N/A;  . TEE WITHOUT CARDIOVERSION N/A 03/28/2020   Procedure: TRANSESOPHAGEAL ECHOCARDIOGRAM (TEE);  Surgeon: Buford Dresser, MD;  Location: [facility];   Service: Cardiovascular;  Laterality: N/A;  . THYROIDECTOMY  10/18/2011   Procedure: THYROIDECTOMY WITH RADICAL NECK DISSECTION;  Surgeon: Izora Gala, MD;  Location: [place];  Service: ENT;  Laterality: Right;    There were no vitals filed for this visit.   Subjective Assessment - 10/14/20 0958    Subjective Patient reports she is tired today. She is still willing to work with therapy.    Pertinent History History of lung cancer for 18 years. ORIF Rt tibial plateau 06-11-19, recent hospitalization due to cellulitis and sepsis  See medical hx    How long can you sit comfortably? can sit in a wheelchar without difficulty    How long can you walk comfortably? walked with help by PT only    Diagnostic tests recent hospitalization    Patient Stated Goals to be able to walk to the bathroom, to be able to transfer , to be able to stand on my own    Currently in Pain? No/denies                             [facility] Adult PT Treatment/Exercise - 10/14/20 0001      Ambulation/Gait   Gait Comments Ambaultion 2x. 1st visit 4 feet 1st trial. Difficulty getitng herweight forward. 4'; Second trial she did better standing and was able to take 9 step. She required cuing to move her hands forward.      Therapeutic Activites    Other Therapeutic Activities reviewed squat pivot transfer. On the first trial she became nervous byut  did not tense up as much. On the second trial she did better transfering.      Knee/Hip Exercises: Seated   Long Arc Quad Limitations 3x10 2llb    Other Seated Knee/Hip Exercises ball roll forward for weight shifdting x20 ball press for posture x20    Marching Limitations seated march 2x10      Shoulder Exercises: Seated   Other Seated Exercises horizontal abduction 2x10 red  bilateral er 2x10 yellow; bilateral shoulder flexion with yellow 2x10 ; triceps extension red x20                  PT Education - 10/14/20 0959    Education Details technique with  squat pivot transfer    Person(s) Educated Patient    Methods Explanation;Demonstration;Tactile cues;Verbal cues    Comprehension Verbal cues required;Returned demonstration;Verbalized understanding            PT Short Term Goals - 10/11/20 1716      PT SHORT TERM GOAL #1   Title Patient will transfer sit to stand with min a    Baseline mod  A    Time 6    Period Weeks    Status On-going      PT SHORT TERM GOAL #2   Title Patient will stand for 60 seconds without significant fatigue    Baseline 64 seconds but fatigues rapidly    Time 6    Period Weeks    Status On-going      PT SHORT TERM GOAL #3   Title Patient will use walker to take a step    Baseline pt in llbars now    Time 6    Period Weeks    Status On-going      PT SHORT TERM GOAL #4   Title Pt will use sliding board and min A from W/C to mat    Baseline Pt xfers with modA but fatigues easily and pushes posteriorly for inefficient x fer    Time 6    Period Weeks    Status On-going             PT Long Term Goals - 08/23/20 1753      PT LONG TERM GOAL #1   Title Patient will ambulate 59' with mod A and AD    Time 12    Period Weeks    Status New    Target Date 11/15/20      PT LONG TERM GOAL #2   Title Patient will trnasfer from her wheelchair to a regular chair with min gaurd with walker    Time 12    Period Weeks    Status New    Target Date 11/15/20      PT LONG TERM GOAL #3   Title Patient will stand for 5 minutes in order to perfrom ADL's at the sink    Baseline Eval 35 sec , then 15 sec fatigues easily    Time 12    Status New    Target Date 11/15/20      PT LONG TERM GOAL #4   Title Pt will be able to perform full range bridge in order to perform bed mobility with 50% greater ease    Time 12    Period Weeks    Status New    Target Date 11/15/20                 Plan - 10/14/20 1000    Clinical Impression Statement Patient was fatigued today but she was atill able to take  steps and transfer. Patient also worked on weight shifting and ball pressing for posture. SAhe is doing home exercises 3x a week. She had more difficulty with first trial of ambualtion, but on the second trail she did better taking steps. We will do a progress note next visit.    Personal Factors and Comorbidities Comorbidity 1;Comorbidity 2;Comorbidity 3+    Comorbidities History of lung cancer for 18 years. ORIF Rt tibial plateau 06-11-19, recent hospitalization due to cellulitis and sepsis  See medical hx    Examination-Activity Limitations Bed Mobility;Bathing;Bend;Carry;Lift;Dressing;Locomotion Level;Squat;Stairs;Stand;Toileting;Transfers    Examination-Participation Restrictions Church;Community Activity;Cleaning;Laundry;Medication Management    Stability/Clinical Decision Making Unstable/Unpredictable    Clinical Decision Making High    Rehab Potential Fair    PT Frequency 2x / week    PT Duration 12 weeks    PT Treatment/Interventions ADLs/Self Care Home Management;Cryotherapy;Gait training;Stair training;Functional mobility training;Therapeutic activities;Therapeutic exercise;Neuromuscular re-education;Patient/family education;Manual techniques    PT Next Visit Plan xfers and standing tolerance, balance in sitting to improve xfers  review HEP with resistance (  red to challenge in sitting)    PT Home Exercise Plan [id number]    Consulted and Agree with Plan of Care Patient           Patient will benefit from skilled therapeutic intervention in order to improve the following deficits and impairments:  Abnormal gait,Decreased activity tolerance,Decreased endurance,Difficulty walking,Decreased mobility,Decreased strength,Pain,Postural dysfunction  Visit Diagnosis: Other abnormalities of gait and mobility  Abnormal posture  Muscle weakness (generalized)     Problem List Patient Active Problem List   Diagnosis Date Noted  . Malnutrition of moderate degree 06/13/2020  . Rash and  nonspecific skin eruption   . Metastatic malignant neoplasm ([place])   . Failure to thrive in adult   . Asymptomatic bacteriuria   . Pressure injury of skin 06/03/2020  . Cellulitis 06/03/2020  . SVT (supraventricular tachycardia) ([place]) 03/26/2020  . Normocytic anemia 03/24/2020  . Urinary tract infection without hematuria   . AMS (altered mental status) 03/23/2020  . Hypokalemia 03/23/2020  . Prolonged Q-T interval on ECG 03/23/2020  . DNR (do not resuscitate) 03/23/2020  . MRSA bacteremia 03/23/2020  . Acute combined systolic and diastolic heart failure ([place])   . Elevated troponin   . Dilated cardiomyopathy ([place])   . Coronary artery disease involving native coronary artery of native heart without angina pectoris   . Congestive heart failure (CHF) ([place]) 02/14/2020  . Pyelonephritis 09/10/2019  . Acute blood loss anemia 06/12/2019  . Closed fracture of distal end of right femur, initial encounter ([place]) 06/11/2019  . Closed fracture of lateral portion of right tibial plateau 06/11/2019  . History of DVT of lower extremity 06/11/2019  . Blurry vision 01/06/2019  . Headache 07/24/2016  . Cough 08/24/2015  . Chronic diarrhea 08/24/2015  . Sepsis ([place])   . Encounter for antineoplastic chemotherapy 01/31/2015  . Urinary retention 10/20/2014  . Ileus ([place]) 10/20/2014  . Cellulitis of leg, right 10/20/2014  . Fracture of right proximal fibula   . Ankle fracture 10/02/2014  . Ankle fracture 10/02/2014  . Lung cancer ([place]) 10/02/2014  . Closed fibular fracture 10/02/2014  . Dementia ([place]) 10/02/2014  . Lower urinary tract infectious disease 10/02/2014  . Reactive airway disease 10/02/2014  . Fall 10/02/2014  . Closed left ankle fracture 10/01/2014  . Right fibular fracture 10/01/2014  . Lung metastasis ([place])   . LESION, ANUS 04/26/2009  . DIARRHEA 04/26/2009  . Primary cancer of right upper lobe of lung ([place]) 04/22/2009  . Malignant neoplasm of brain ([place]) 04/22/2009  . VITAMIN D  DEFICIENCY 04/22/2009  . ANAL FISTULA 04/22/2009  . OSTEOPOROSIS 04/22/2009    Carney Living PT DPT  10/14/2020, 10:02 AM  [facility] [address] [place], [place], [address] Phone: [phone]   Fax:  [phone]  Name: Valerie [name] MRN: [id number] Date of Birth: 02-28-1946

## 2020-10-18 ENCOUNTER — Other Ambulatory Visit: Payer: Self-pay

## 2020-10-18 ENCOUNTER — Encounter: Payer: Self-pay | Admitting: Physical Therapy

## 2020-10-18 ENCOUNTER — Ambulatory Visit: Payer: Medicare PPO | Attending: Internal Medicine | Admitting: Physical Therapy

## 2020-10-18 DIAGNOSIS — R2689 Other abnormalities of gait and mobility: Secondary | ICD-10-CM

## 2020-10-18 DIAGNOSIS — M6281 Muscle weakness (generalized): Secondary | ICD-10-CM | POA: Insufficient documentation

## 2020-10-18 DIAGNOSIS — R293 Abnormal posture: Secondary | ICD-10-CM | POA: Diagnosis not present

## 2020-10-18 NOTE — Patient Instructions (Signed)
     Voncille Lo, PT, [place] Certified Exercise Expert for the Aging Adult  10/18/20 2:10 PM Phone: [phone] Fax: [phone]

## 2020-10-18 NOTE — Therapy (Signed)
[facility] [address] [place], [place], [address] Phone: [phone]   Fax:  [phone]  Physical Therapy Treatment/Progress Note  Progress Note Reporting Period 08-23-20 to  10-18-20  See note below for Objective Data and Assessment of Progress/Goals.   Patient Details  Name: Valerie [name] MRN: [id number] Date of Birth: [birth year] Referring Provider (PT): Dorothyann Peng MD   Encounter Date: 10/18/2020   PT End of Session - 10/18/20 1452    Visit Number 10    Number of Visits 24    Date for PT Re-Evaluation 11/15/20    Authorization Type [employer] MCR  10 VISIT PROGRESS NOTE; KX AT 15    PT Start Time 1320    PT Stop Time 1420    PT Time Calculation (min) 60 min    Activity Tolerance Patient tolerated treatment well;Patient limited by fatigue    Behavior During Therapy [facility] for tasks assessed/performed           Past Medical History:  Diagnosis Date  . Abnormal Pap smear 2006  . Anal fistula   . Ankle fracture   . Anxiety   . Arthritis   . Atrophic vaginitis 2008  . Cataract   . Dementia (HCC) 2009  . Dyspareunia 2008  . H/O osteoporosis   . H/O varicella   . H/O vitamin D deficiency   . Headache 07/24/2016  . Heart murmur   . History of measles, mumps, or rubella   . History of radiation therapy 07/28/13- 08/10/13   right lung metastasis 5000 cGy 10 sessions  . Hypertension   . Lung cancer (HCC) dx'd 2002  . Lung cancer (HCC)   . Lung cancer (HCC)   . Lung metastasis (HCC)    PET scan 05/05/13, RUL lung nodule  . 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 Metastasis to lymph nodes (HCC) dx'd 09/2011  . Nodule of right lung CT- 06/03/12   RIGHT UPPER LOBE  . Nodule of right lung 06/03/12   Upper Lobe  . On antineoplastic chemotherapy    TARCEVA  . Osteoporosis 2010  . Primary cancer of right upper lobe of lung (HCC) 04/22/2009   Qualifier: Diagnosis of  By: Koleen Distance CMA (AAMA), Hulan Saas    . Shortness of  breath    hx lung ca   . Status post chemotherapy 2003   CARBOPLATIN/PACLITAXEL /STATUS POST CLINICAL TRAIL OF CELEBREX AND IRESSA AT [facility] FOR 1 YEAR  . Status post radiation therapy 2003   LEFT LUNG  . Status post radiation therapy 11/07/2005   WHOLE BRAIN: DR Jill Alexanders [facility]  . Status post radiation therapy 06/02/2008   GAMMA KNIFE OF RESECTED CAVITAY  . Thyroid adenoma    ?  [name] Thyroid cancer (HCC) 10/18/11 bx   adenoid nodules   . Yeast infection     Past Surgical History:  Procedure Laterality Date  . BRAIN SURGERY    . INTRAVASCULAR ULTRASOUND/IVUS N/A 02/16/2020   Procedure: Intravascular Ultrasound/IVUS;  Surgeon: Marykay Lex, MD;  Location[facility] INVASIVE CV LAB;  Service: Cardiovascular;  Laterality: N/A;  . LEFT LOWER LOBECTOMY  05/2001  . LEFT OCCIPITAL CRANIOTOMY  09/21/2005   tumor  . LUNG LOBECTOMY    . NECK SURGERY    . ORIF ANKLE FRACTURE Left 10/02/2014   Procedure: OPEN REDUCTION INTERNAL FIXATION (ORIF) ANKLE FRACTURE;  Surgeon: Harvie Junior, MD;  Location: [facility];  Service: Orthopedics;  Laterality: Left;  . ORIF FEMUR FRACTURE Right 06/11/2019  Procedure: OPEN REDUCTION INTERNAL FIXATION (ORIF) DISTAL FEMUR FRACTURE;  Surgeon: Renette Butters, MD;  Location: [place];  Service: Orthopedics;  Laterality: Right;  . ORIF TIBIA PLATEAU Right 06/11/2019   Procedure: Open Reduction Internal Fixation (Orif) Tibial Plateau;  Surgeon: Renette Butters, MD;  Location: [place];  Service: Orthopedics;  Laterality: Right;  . RADICAL NECK DISSECTION  10/18/2011   Procedure: RADICAL NECK DISSECTION;  Surgeon: Izora Gala, MD;  Location: [place];  Service: ENT;  Laterality: Right;  RIGHT MODIFIED NECK DISSECTION /POSSIBLE RIGHT THYROIDECTOM  . RIGHT/LEFT HEART CATH AND CORONARY ANGIOGRAPHY N/A 02/16/2020   Procedure: RIGHT/LEFT HEART CATH AND CORONARY ANGIOGRAPHY;  Surgeon: Leonie Man, MD;  Location: [place] CV LAB;  Service: Cardiovascular;  Laterality: N/A;  . TEE WITHOUT  CARDIOVERSION N/A 03/28/2020   Procedure: TRANSESOPHAGEAL ECHOCARDIOGRAM (TEE);  Surgeon: Buford Dresser, MD;  Location: [facility] ENDOSCOPY;  Service: Cardiovascular;  Laterality: N/A;  . THYROIDECTOMY  10/18/2011   Procedure: THYROIDECTOMY WITH RADICAL NECK DISSECTION;  Surgeon: Izora Gala, MD;  Location: [place];  Service: ENT;  Laterality: Right;    There were no vitals filed for this visit.   Subjective Assessment - 10/18/20 1321    Subjective Pt reports she is fatigued today.  She still has a different device a rollator at home she uses with a caregiver that helps her stand and transfer 3 x a week.  She reports being very fearful of falling that started her difficulties 5 years ago.    Patient is accompained by: Family member   husband stays in lobby   Pertinent History History of lung cancer for 18 years. ORIF Rt tibial plateau 06-11-19, recent hospitalization due to cellulitis and sepsis  See medical hx    How long can you sit comfortably? can sit in a wheelchar without difficulty    How long can you stand comfortably? stands with help    How long can you walk comfortably? walked with help by PT only    Diagnostic tests recent hospitalization    Patient Stated Goals to be able to walk to the bathroom, to be able to transfer , to be able to stand on my own    Currently in Pain? No/denies    Pain Score 0-No pain    Pain Orientation Right;Left    Pain Onset More than a month ago              [facility] PT Assessment - 10/18/20 0001      Assessment   Medical Diagnosis gait abnormality, Tibial palteua fracture/ severe debility    Referring Provider (PT) [name] MD    Hand Dominance Right      Precautions   Precautions Fall    Precaution Comments no falls since beginning PT but does have fear of falling      Strength   Overall Strength Deficits    Overall Strength Comments Pt requires help for transfers / standing due to repeated falls    Right Hip Flexion 3+/5    Right Hip  Extension 3-/5   tested in supine   Right Hip Internal Rotation 3+/5    Right Hip ABduction 3+/5    Left Hip Flexion 3+/5    Left Hip Extension 3-/5   tested in supine unable to lift hips for  partial bridging   Left Hip ABduction 3+/5    Left Hip ADduction 3+/5    Right Knee Flexion 3+/5    Right Knee Extension 4-/5  Left Knee Flexion 3+/5    Left Knee Extension 4-/5                         [facility] Adult PT Treatment/Exercise - 10/18/20 0001      Transfers   Transfers Sit to Supine;Sit to Stand;Stand to Sit    Sit to Stand Details (indicate cue type and reason) needs assistance x 2 for safety and tendency to push posteriorly enhanced by fear of falling    Sit to Supine 4: Min assist    Sit to Supine Details (indicate cue type and reason) Verbal cues for sequencing    Comments sit to stand x 4 with min to mod A as necessary due to pt fearful of falling. mod cues. Pt tends to push posteriorly and expends energy rather than unweighting by moving anteriorly in seat with head over knees. .  transfer wtih mod A from W/C to mat      Ambulation/Gait   Gait Comments Pt able to ambulate with 2 person mod assist 10 feet x 2 but fatigues easily requries 2 min rest      Therapeutic Activites    Other Therapeutic Activities pt transfers x 4 as noted above,  Pt scooting on mat to supine,      Lumbar Exercises: Supine   Bent Knee Raise 10 reps    Bent Knee Raise Limitations R and L and constant VC      Knee/Hip Exercises: Stretches   Piriformis Stretch 2 reps;Left;Right;30 seconds    Piriformis Stretch Limitations asssited by PT    Gastroc Stretch 3 reps;Left;Right;30 seconds    Gastroc Stretch Limitations use of green strap to stretch by pt      Knee/Hip Exercises: Seated   Long Arc Quad Right;Left;3 sets;10 reps    Long Arc Quad Limitations red t band    Other Seated Knee/Hip Exercises ball forward roll for forward unweighting of seat and wt bear in heels of feet 20 x and  ball press for posture    Marching Limitations seated march 2x10      Knee/Hip Exercises: Supine   Quad Sets 2 sets;10 reps;Right;Left    Quad Sets Limitations with towel under knee    Bridges with Ball Squeeze 10 reps;Both;Strengthening    Straight Leg Raises Right;Left;2 sets;10 reps    Straight Leg Raises Limitations pt with extension leg bil  Left worse than Right                  PT Education - 10/18/20 1449    Education Details reinforced squat pivot transfer, added to HEP for supine LE strength that pt is Independent doing    Person(s) Educated Patient    Methods Demonstration;Explanation;Tactile cues;Verbal cues;Handout    Comprehension Verbalized understanding;Returned demonstration            PT Short Term Goals - 10/18/20 1709      PT SHORT TERM GOAL #1   Title Patient will transfer sit to stand with min a    Baseline min A to modA depending on fatigue level    Time 6    Period Weeks    Status Partially Met    Target Date 10/04/20      PT SHORT TERM GOAL #2   Title Patient will stand for 60 seconds without significant fatigue    Baseline Pt able to wak for 10 feet n llbars    Time 6  Period Weeks    Status Achieved    Target Date 10/04/20      PT SHORT TERM GOAL #3   Title Patient will use walker to take a step    Baseline pt in llbars now    Time 6    Period Weeks    Status On-going    Target Date 10/04/20      PT SHORT TERM GOAL #4   Title Pt will use sliding board and min A from W/C to mat    Baseline Pt xfers with modA but fatigues easily and pushes posteriorly for inefficient x fer/ slinding board not optimal for pt and will continue to strengthen    Time 6    Period Weeks    Status Deferred    Target Date 10/04/20             PT Long Term Goals - 10/18/20 1711      PT LONG TERM GOAL #1   Title Patient will ambulate 81' with mod A and AD    Baseline Pt ambulating 10 feet x 2 in llbars and min to mod A    Time 12    Status  On-going      PT LONG TERM GOAL #2   Title Patient will trnasfer from her wheelchair to a regular chair with min gaurd with walker    Baseline Pt requires min to mod A/ fatigues easily    Time 12    Period Weeks    Status On-going      PT LONG TERM GOAL #3   Title Patient will stand for 5 minutes in order to perfrom ADL's at the sink    Baseline Eval 35 sec , then 15 sec fatigues easily    Time 12    Period Weeks    Status On-going      PT LONG TERM GOAL #4   Title Pt will be able to perform full range bridge in order to perform bed mobility with 50% greater ease    Baseline Pt working on supine bridges with 1/2 partial range    Time 12    Period Weeks    Status On-going                 Plan - 10/18/20 1402    Clinical Impression Statement Ms Skipper is making slow but moderate progress toward being able to have easier transferring and standing with help.  Pt cancer / fear of falling/ is main problem toward successful progress in transfers.  Pt is slowly becoming more comfortable and successfully completing transfers with min to mod A.  Pt has been given HEP with supine and sitting HEP for pt to complete independently.  Ms Follansbee reports that she has an aide that is helping her to stand and transfer and uses a rollator at home with help.  Pt functionally needs assitance to safely mobilize transfers and gait for very short distances on in llbars at clinic.  and is not safe to attempt on her own.  Pt fatigues very easily. STG # 1 and 2 achieved or partially met.  STG# 4 deferred and . pt walking with a walker is ongoing .  Will continue to work toward LTG and POC    Personal Factors and Comorbidities Comorbidity 1;Comorbidity 2;Comorbidity 3+    Comorbidities History of lung cancer for 18 years. ORIF Rt tibial plateau 06-11-19, recent hospitalization due to cellulitis and sepsis  See medical hx  Examination-Activity Limitations Bed  Mobility;Bathing;Bend;Carry;Lift;Dressing;Locomotion Level;Squat;Stairs;Stand;Toileting;Transfers    Examination-Participation Restrictions Church;Community Activity;Cleaning;Laundry;Medication Management    Stability/Clinical Decision Making Unstable/Unpredictable    PT Treatment/Interventions ADLs/Self Care Home Management;Cryotherapy;Gait training;Stair training;Functional mobility training;Therapeutic activities;Therapeutic exercise;Neuromuscular re-education;Patient/family education;Manual techniques    PT Next Visit Plan xfers and standing tolerance, balance in sitting to improve xfers  review HEP with resistance (red to challenge in sitting)    PT Home Exercise Plan [email or web address]    Consulted and Agree with Plan of Care Patient           Patient will benefit from skilled therapeutic intervention in order to improve the following deficits and impairments:  Abnormal gait,Decreased activity tolerance,Decreased endurance,Difficulty walking,Decreased mobility,Decreased strength,Pain,Postural dysfunction  Visit Diagnosis: Other abnormalities of gait and mobility  Abnormal posture  Muscle weakness (generalized)  Access Code: [email or web address]: [email or web address]: 03/01/2022Prepared by: Donnetta Simpers BeardsleyExercises   Supine Quad Set - 1 x daily - 7 x weekly - 3 sets - 10 reps  Active Straight Leg Raise with Quad Set - 1 x daily - 7 x weekly - 3 sets - 10 reps  Supine March with Alternating Leg Lifts - 1 x daily - 7 x weekly - 3 sets - 10 reps     Problem List Patient Active Problem List   Diagnosis Date Noted  . Malnutrition of moderate degree 06/13/2020  . Rash and nonspecific skin eruption   . Metastatic malignant neoplasm ([place])   . Failure to thrive in adult   . Asymptomatic bacteriuria   . Pressure injury of skin 06/03/2020  . Cellulitis 06/03/2020  . SVT (supraventricular tachycardia) ([place]) 03/26/2020  . Normocytic anemia 03/24/2020  . Urinary tract infection  without hematuria   . AMS (altered mental status) 03/23/2020  . Hypokalemia 03/23/2020  . Prolonged Q-T interval on ECG 03/23/2020  . DNR (do not resuscitate) 03/23/2020  . MRSA bacteremia 03/23/2020  . Acute combined systolic and diastolic heart failure ([place])   . Elevated troponin   . Dilated cardiomyopathy ([place])   . Coronary artery disease involving native coronary artery of native heart without angina pectoris   . Congestive heart failure (CHF) ([place]) 02/14/2020  . Pyelonephritis 09/10/2019  . Acute blood loss anemia 06/12/2019  . Closed fracture of distal end of right femur, initial encounter ([place]) 06/11/2019  . Closed fracture of lateral portion of right tibial plateau 06/11/2019  . History of DVT of lower extremity 06/11/2019  . Blurry vision 01/06/2019  . Headache 07/24/2016  . Cough 08/24/2015  . Chronic diarrhea 08/24/2015  . Sepsis ([place])   . Encounter for antineoplastic chemotherapy 01/31/2015  . Urinary retention 10/20/2014  . Ileus ([place]) 10/20/2014  . Cellulitis of leg, right 10/20/2014  . Fracture of right proximal fibula   . Ankle fracture 10/02/2014  . Ankle fracture 10/02/2014  . Lung cancer ([place]) 10/02/2014  . Closed fibular fracture 10/02/2014  . Dementia ([place]) 10/02/2014  . Lower urinary tract infectious disease 10/02/2014  . Reactive airway disease 10/02/2014  . Fall 10/02/2014  . Closed left ankle fracture 10/01/2014  . Right fibular fracture 10/01/2014  . Lung metastasis ([place])   . LESION, ANUS 04/26/2009  . DIARRHEA 04/26/2009  . Primary cancer of right upper lobe of lung ([place]) 04/22/2009  . Malignant neoplasm of brain ([place]) 04/22/2009  . VITAMIN D DEFICIENCY 04/22/2009  . ANAL FISTULA 04/22/2009  . OSTEOPOROSIS 04/22/2009    Voncille Lo, PT, [place] Certified Exercise Expert for the Aging Adult  10/18/20 5:14 PM Phone: [phone] Fax: [phone]  [facility] [address] [place], [place], [address] Phone: [phone]   Fax:  [phone]  Name: JASLYNE BEECK MRN: [id number] Date of Birth: 08-Jul-1946

## 2020-10-19 ENCOUNTER — Telehealth: Payer: Medicare PPO

## 2020-10-19 ENCOUNTER — Telehealth: Payer: Self-pay

## 2020-10-19 NOTE — Telephone Encounter (Signed)
  Chronic Care Management   Outreach Note  10/19/2020 Name: Valerie [name] MRN: [id number] DOB: 06-Aug-1946  Referred by: [name], MD Reason for referral : Chronic Care Management (RN CM FU Call Attempt )   An unsuccessful telephone outreach was attempted today. The patient was referred to the case management team for assistance with care management and care coordination.   Follow Up Plan: Telephone follow up appointment with care management team member scheduled for: 10/20/20  Barb Merino, RN, BSN, CCM Care Management Coordinator [place] [facility]  Direct Phone: [phone]

## 2020-10-20 ENCOUNTER — Other Ambulatory Visit: Payer: Self-pay

## 2020-10-20 ENCOUNTER — Telehealth: Payer: Self-pay

## 2020-10-20 ENCOUNTER — Telehealth: Payer: Medicare PPO

## 2020-10-20 ENCOUNTER — Ambulatory Visit: Payer: Medicare PPO | Admitting: Physical Therapy

## 2020-10-20 ENCOUNTER — Encounter: Payer: Self-pay | Admitting: Physical Therapy

## 2020-10-20 DIAGNOSIS — R293 Abnormal posture: Secondary | ICD-10-CM

## 2020-10-20 DIAGNOSIS — M6281 Muscle weakness (generalized): Secondary | ICD-10-CM

## 2020-10-20 DIAGNOSIS — R2689 Other abnormalities of gait and mobility: Secondary | ICD-10-CM | POA: Diagnosis not present

## 2020-10-20 DIAGNOSIS — R54 Age-related physical debility: Secondary | ICD-10-CM | POA: Diagnosis not present

## 2020-10-20 NOTE — Telephone Encounter (Signed)
  Chronic Care Management   Outreach Note  10/20/2020 Name: Valerie [name] MRN: [id number] DOB: 08-17-1946  Referred by: [name], MD Reason for referral : Chronic Care Management (#2 RN CM FU Call Attempt)   A second unsuccessful telephone outreach was attempted today. The patient was referred to the case management team for assistance with care management and care coordination.   Follow Up Plan: Telephone follow up appointment with care management team member scheduled for: 11/17/20  Barb Merino, RN, BSN, CCM Care Management Coordinator [place] [facility]  Direct Phone: [phone]

## 2020-10-21 ENCOUNTER — Encounter: Payer: Self-pay | Admitting: Physical Therapy

## 2020-10-21 NOTE — Therapy (Signed)
[place], [place], [address] Phone: [phone]   Fax:  [phone]  Physical Therapy Treatment  Patient Details  Name: Valerie [name] MRN: [id number] Date of Birth: [birth year] Referring Provider (PT): [name] MD   Encounter Date: 10/20/2020   PT End of Session - 10/21/20 0849    Visit Number 11    Number of Visits 24    Date for PT Re-Evaluation 11/15/20    Authorization Type [employer] MCR  10 VISIT PROGRESS NOTE; KX AT 15    PT Start Time 1330    PT Stop Time 1412    PT Time Calculation (min) 42 min    Activity Tolerance Patient tolerated treatment well;Patient limited by fatigue    Behavior During Therapy [facility] for tasks assessed/performed           Past Medical History:  Diagnosis Date  . Abnormal Pap smear 2006  . Anal fistula   . Ankle fracture   . Anxiety   . Arthritis   . Atrophic vaginitis 2008  . Cataract   . Dementia ([place]) 2009  . Dyspareunia 2008  . H/O osteoporosis   . H/O varicella   . H/O vitamin D deficiency   . Headache 07/24/2016  . Heart murmur   . History of measles, mumps, or rubella   . History of radiation therapy 07/28/13- 08/10/13   right lung metastasis 5000 cGy 10 sessions  . Hypertension   . Lung cancer ([place]) dx'd 2002  . Lung cancer ([place])   . Lung cancer ([place])   . Lung metastasis ([place])    PET scan 05/05/13, RUL lung nodule  . 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 Nodule of right lung CT- 06/03/12   RIGHT UPPER LOBE  . Nodule of right lung 06/03/12   Upper Lobe  . On antineoplastic chemotherapy    TARCEVA  . Osteoporosis 2010  . Primary cancer of right upper lobe of lung ([place]) 04/22/2009   Qualifier: Diagnosis of  By: Nils Pyle CMA ([place]), Mearl Latin    . Shortness of breath    hx lung ca   . Status post chemotherapy 2003   CARBOPLATIN/PACLITAXEL /STATUS POST CLINICAL TRAIL OF CELEBREX AND IRESSA AT [facility]  FOR 1 YEAR  . Status post radiation therapy 2003   LEFT LUNG  . Status post radiation therapy 11/07/2005   WHOLE BRAIN: DR Larkin [facility]  . Status post radiation therapy 06/02/2008   GAMMA KNIFE OF RESECTED CAVITAY  . Thyroid adenoma    ?  [name] Thyroid cancer ([place]) 10/18/11 bx   adenoid nodules   . Yeast infection     Past Surgical History:  Procedure Laterality Date  . BRAIN SURGERY    . INTRAVASCULAR ULTRASOUND/IVUS N/A 02/16/2020   Procedure: Intravascular Ultrasound/IVUS;  Surgeon: Leonie Man, MD;  Location: [facility];  Service: Cardiovascular;  Laterality: N/A;  . LEFT LOWER LOBECTOMY  05/2001  . LEFT OCCIPITAL CRANIOTOMY  09/21/2005   tumor  . LUNG LOBECTOMY    . NECK SURGERY    . ORIF ANKLE FRACTURE Left 10/02/2014   Procedure: OPEN REDUCTION INTERNAL FIXATION (ORIF) ANKLE FRACTURE;  Surgeon: Alta Corning, MD;  Location: [facility];  Service: Orthopedics;  Laterality: Left;  . ORIF FEMUR FRACTURE Right 06/11/2019   Procedure: OPEN REDUCTION INTERNAL FIXATION (ORIF) DISTAL FEMUR FRACTURE;  Surgeon: Renette Butters, MD;  Location: [facility];  Service: Orthopedics;  Laterality: Right;  . ORIF TIBIA PLATEAU Right 06/11/2019   Procedure: Open Reduction Internal Fixation (Orif) Tibial Plateau;  Surgeon: Sheral Apley, MD;  Location: [facility];  Service: Orthopedics;  Laterality: Right;  . RADICAL NECK DISSECTION  10/18/2011   Procedure: RADICAL NECK DISSECTION;  Surgeon: Serena Colonel, MD;  Location: [facility] OR;  Service: ENT;  Laterality: Right;  RIGHT MODIFIED NECK DISSECTION /POSSIBLE RIGHT THYROIDECTOM  . RIGHT/LEFT HEART CATH AND CORONARY ANGIOGRAPHY N/A 02/16/2020   Procedure: RIGHT/LEFT HEART CATH AND CORONARY ANGIOGRAPHY;  Surgeon: Marykay Lex, MD;  Location: [facility] INVASIVE CV LAB;  Service: Cardiovascular;  Laterality: N/A;  . TEE WITHOUT CARDIOVERSION N/A 03/28/2020   Procedure: TRANSESOPHAGEAL ECHOCARDIOGRAM (TEE);  Surgeon: Jodelle Red, MD;  Location: [facility] ENDOSCOPY;   Service: Cardiovascular;  Laterality: N/A;  . THYROIDECTOMY  10/18/2011   Procedure: THYROIDECTOMY WITH RADICAL NECK DISSECTION;  Surgeon: Serena Colonel, MD;  Location: [facility] OR;  Service: ENT;  Laterality: Right;    There were no vitals filed for this visit.   Subjective Assessment - 10/20/20 1352    Subjective Patient has no complaints today. She reports she is feeling a little stronger then the other day> She reports she flet good after the last visit.    Pertinent History History of lung cancer for 18 years. ORIF Rt tibial plateau 06-11-19, recent hospitalization due to cellulitis and sepsis  See medical hx    How long can you sit comfortably? can sit in a wheelchar without difficulty    How long can you stand comfortably? stands with help    How long can you walk comfortably? walked with help by PT only    Diagnostic tests recent hospitalization    Patient Stated Goals to be able to walk to the bathroom, to be able to transfer , to be able to stand on my own    Currently in Pain? No/denies                             [facility] Adult PT Treatment/Exercise - 10/21/20 0001      Ambulation/Gait   Gait Comments 10' with mod a for sit to stand transfer, better ability to get her weight forward today. Less cuing for hand placement  10'x2      Therapeutic Activites    Other Therapeutic Activities Si to stand 2x with walker mod cuing for hand placement mod a for sit to stand. Max cuing for sitting down. We will work on cotrolling decent.      Knee/Hip Exercises: Seated   Long Arc Quad Right;Left;3 sets;10 reps    Long Arc Quad Limitations red t band    Other Seated Knee/Hip Exercises ball forward roll for forward unweighting of seat and wt bear in heels of feet 20 x and ball press for posture    Marching Limitations seated march 2x10      Shoulder Exercises: Seated   Other Seated Exercises triceps extension 3x10 red    Other Seated Exercises biceps curls 2x15 2lb                   PT Education - 10/21/20 0801    Education Details technique with walker    Person(s) Educated Patient    Methods Explanation;Demonstration;Tactile cues;Verbal cues    Comprehension Verbalized understanding;Returned demonstration;Verbal cues required;Tactile cues required            PT Short Term Goals -  10/18/20 1709      PT SHORT TERM GOAL #1   Title Patient will transfer sit to stand with min a    Baseline min A to modA depending on fatigue level    Time 6    Period Weeks    Status Partially Met    Target Date 10/04/20      PT SHORT TERM GOAL #2   Title Patient will stand for 60 seconds without significant fatigue    Baseline Pt able to wak for 10 feet n llbars    Time 6    Period Weeks    Status Achieved    Target Date 10/04/20      PT SHORT TERM GOAL #3   Title Patient will use walker to take a step    Baseline pt in llbars now    Time 6    Period Weeks    Status On-going    Target Date 10/04/20      PT SHORT TERM GOAL #4   Title Pt will use sliding board and min A from W/C to mat    Baseline Pt xfers with modA but fatigues easily and pushes posteriorly for inefficient x fer/ slinding board not optimal for pt and will continue to strengthen    Time 6    Period Weeks    Status Deferred    Target Date 10/04/20             PT Long Term Goals - 10/18/20 1711      PT LONG TERM GOAL #1   Title Patient will ambulate 25' with mod A and AD    Baseline Pt ambulating 10 feet x 2 in llbars and min to mod A    Time 12    Status On-going      PT LONG TERM GOAL #2   Title Patient will trnasfer from her wheelchair to a regular chair with min gaurd with walker    Baseline Pt requires min to mod A/ fatigues easily    Time 12    Period Weeks    Status On-going      PT LONG TERM GOAL #3   Title Patient will stand for 5 minutes in order to perfrom ADL's at the sink    Baseline Eval 35 sec , then 15 sec fatigues easily    Time 12    Period Weeks     Status On-going      PT LONG TERM GOAL #4   Title Pt will be able to perform full range bridge in order to perform bed mobility with 50% greater ease    Baseline Pt working on supine bridges with 1/2 partial range    Time 12    Period Weeks    Status On-going                 Plan - 10/21/20 0902    Clinical Impression Statement Patient is making steady progress. She ambualted 10' in II bars with moderate fatigue. She was able to transfer sit to stand with the walker 2x. She tolerated ther-ex well. She remains very motivated. She is getting some type of II bar at her house. she was advised to make sure it is secured well.    Personal Factors and Comorbidities Comorbidity 3+    Comorbidities History of lung cancer for 18 years. ORIF Rt tibial plateau 06-11-19, recent hospitalization due to cellulitis and sepsis  See medical hx    Examination-Activity Limitations  Bed Mobility;Bathing;Bend;Carry;Lift;Dressing;Locomotion Level;Squat;Stairs;Stand;Toileting;Transfers    Examination-Participation Restrictions Church;Community Activity;Cleaning;Laundry;Medication Management    Stability/Clinical Decision Making Unstable/Unpredictable    Clinical Decision Making High    Rehab Potential Fair    PT Frequency 2x / week    PT Treatment/Interventions ADLs/Self Care Home Management;Cryotherapy;Gait training;Stair training;Functional mobility training;Therapeutic activities;Therapeutic exercise;Neuromuscular re-education;Patient/family education;Manual techniques    PT Next Visit Plan xfers and standing tolerance, balance in sitting to improve xfers  review HEP with resistance (red to challenge in sitting)    PT Home Exercise Plan [id number]    Consulted and Agree with Plan of Care Patient           Patient will benefit from skilled therapeutic intervention in order to improve the following deficits and impairments:  Abnormal gait,Decreased activity tolerance,Decreased endurance,Difficulty  walking,Decreased mobility,Decreased strength,Pain,Postural dysfunction  Visit Diagnosis: Other abnormalities of gait and mobility  Abnormal posture  Muscle weakness (generalized)     Problem List Patient Active Problem List   Diagnosis Date Noted  . Malnutrition of moderate degree 06/13/2020  . Rash and nonspecific skin eruption   . Metastatic malignant neoplasm ([place])   . Failure to thrive in adult   . Asymptomatic bacteriuria   . Pressure injury of skin 06/03/2020  . Cellulitis 06/03/2020  . SVT (supraventricular tachycardia) ([place]) 03/26/2020  . Normocytic anemia 03/24/2020  . Urinary tract infection without hematuria   . AMS (altered mental status) 03/23/2020  . Hypokalemia 03/23/2020  . Prolonged Q-T interval on ECG 03/23/2020  . DNR (do not resuscitate) 03/23/2020  . MRSA bacteremia 03/23/2020  . Acute combined systolic and diastolic heart failure ([place])   . Elevated troponin   . Dilated cardiomyopathy ([place])   . Coronary artery disease involving native coronary artery of native heart without angina pectoris   . Congestive heart failure (CHF) ([place]) 02/14/2020  . Pyelonephritis 09/10/2019  . Acute blood loss anemia 06/12/2019  . Closed fracture of distal end of right femur, initial encounter ([place]) 06/11/2019  . Closed fracture of lateral portion of right tibial plateau 06/11/2019  . History of DVT of lower extremity 06/11/2019  . Blurry vision 01/06/2019  . Headache 07/24/2016  . Cough 08/24/2015  . Chronic diarrhea 08/24/2015  . Sepsis ([place])   . Encounter for antineoplastic chemotherapy 01/31/2015  . Urinary retention 10/20/2014  . Ileus ([place]) 10/20/2014  . Cellulitis of leg, right 10/20/2014  . Fracture of right proximal fibula   . Ankle fracture 10/02/2014  . Ankle fracture 10/02/2014  . Lung cancer ([place]) 10/02/2014  . Closed fibular fracture 10/02/2014  . Dementia ([place]) 10/02/2014  . Lower urinary tract infectious disease 10/02/2014  . Reactive airway  disease 10/02/2014  . Fall 10/02/2014  . Closed left ankle fracture 10/01/2014  . Right fibular fracture 10/01/2014  . Lung metastasis ([employer])   . LESION, ANUS 04/26/2009  . DIARRHEA 04/26/2009  . Primary cancer of right upper lobe of lung ([place]) 04/22/2009  . Malignant neoplasm of brain ([place]) 04/22/2009  . VITAMIN D DEFICIENCY 04/22/2009  . ANAL FISTULA 04/22/2009  . OSTEOPOROSIS 04/22/2009    Carney Living PT DPT  10/21/2020, 9:30 AM  [facility] [address] [place], [place], [address] Phone: [phone]   Fax:  [phone]  Name: Valerie [name] MRN: [id number] Date of Birth: November 17, 1945

## 2020-10-24 MED FILL — ERLOTINIB HCL 100 MG TABS: 100 | 30 days supply | Qty: 30 | Fill #1

## 2020-10-25 ENCOUNTER — Other Ambulatory Visit: Payer: Self-pay

## 2020-10-25 ENCOUNTER — Ambulatory Visit: Payer: Medicare PPO | Admitting: Physical Therapy

## 2020-10-25 ENCOUNTER — Encounter: Payer: Self-pay | Admitting: Physical Therapy

## 2020-10-25 DIAGNOSIS — R2689 Other abnormalities of gait and mobility: Secondary | ICD-10-CM | POA: Diagnosis not present

## 2020-10-25 DIAGNOSIS — R293 Abnormal posture: Secondary | ICD-10-CM

## 2020-10-25 DIAGNOSIS — M6281 Muscle weakness (generalized): Secondary | ICD-10-CM | POA: Diagnosis not present

## 2020-10-25 NOTE — Therapy (Signed)
[place], [place], [address] Phone: [phone]   Fax:  [phone]  Physical Therapy Treatment  Patient Details  Name: Valerie [name] MRN: [id number] Date of Birth: Nov 17, 1945 Referring Provider (PT): [name] MD   Encounter Date: 10/25/2020   PT End of Session - 10/25/20 1434    Visit Number 12    Number of Visits 24    Date for PT Re-Evaluation 11/15/20    Authorization Type [employer] MCR  10 VISIT PROGRESS NOTE; KX AT 15    PT Start Time 1330    PT Stop Time 1416    PT Time Calculation (min) 46 min    Activity Tolerance Patient tolerated treatment well;Patient limited by fatigue    Behavior During Therapy [facility] for tasks assessed/performed           Past Medical History:  Diagnosis Date  . Abnormal Pap smear 2006  . Anal fistula   . Ankle fracture   . Anxiety   . Arthritis   . Atrophic vaginitis 2008  . Cataract   . Dementia ([place]) 2009  . Dyspareunia 2008  . H/O osteoporosis   . H/O varicella   . H/O vitamin D deficiency   . Headache 07/24/2016  . Heart murmur   . History of measles, mumps, or rubella   . History of radiation therapy 07/28/13- 08/10/13   right lung metastasis 5000 cGy 10 sessions  . Hypertension   . Lung cancer ([place]) dx'd 2002  . Lung cancer ([place])   . Lung cancer ([place])   . Lung metastasis ([place])    PET scan 05/05/13, RUL lung nodule  . 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 Nodule of right lung CT- 06/03/12   RIGHT UPPER LOBE  . Nodule of right lung 06/03/12   Upper Lobe  . On antineoplastic chemotherapy    TARCEVA  . Osteoporosis 2010  . Primary cancer of right upper lobe of lung ([place]) 04/22/2009   Qualifier: Diagnosis of  By: Nils Pyle CMA ([place]), Mearl Latin    . Shortness of breath    hx lung ca   . Status post chemotherapy 2003   CARBOPLATIN/PACLITAXEL /STATUS POST CLINICAL TRAIL OF CELEBREX AND IRESSA AT [facility]  FOR 1 YEAR  . Status post radiation therapy 2003   LEFT LUNG  . Status post radiation therapy 11/07/2005   WHOLE BRAIN: DR Larkin [facility]  . Status post radiation therapy 06/02/2008   GAMMA KNIFE OF RESECTED CAVITAY  . Thyroid adenoma    ?  [name] Thyroid cancer ([place]) 10/18/11 bx   adenoid nodules   . Yeast infection     Past Surgical History:  Procedure Laterality Date  . BRAIN SURGERY    . INTRAVASCULAR ULTRASOUND/IVUS N/A 02/16/2020   Procedure: Intravascular Ultrasound/IVUS;  Surgeon: Leonie Man, MD;  Location: [place] [facility];  Service: Cardiovascular;  Laterality: N/A;  . LEFT LOWER LOBECTOMY  05/2001  . LEFT OCCIPITAL CRANIOTOMY  09/21/2005   tumor  . LUNG LOBECTOMY    . NECK SURGERY    . ORIF ANKLE FRACTURE Left 10/02/2014   Procedure: OPEN REDUCTION INTERNAL FIXATION (ORIF) ANKLE FRACTURE;  Surgeon: Alta Corning, MD;  Location: [facility];  Service: Orthopedics;  Laterality: Left;  . ORIF FEMUR FRACTURE Right 06/11/2019   Procedure: OPEN REDUCTION INTERNAL FIXATION (ORIF) DISTAL FEMUR FRACTURE;  Surgeon: Renette Butters, MD;  Location: [place];  Service: Orthopedics;  Laterality: Right;  . ORIF TIBIA PLATEAU Right 06/11/2019   Procedure: Open Reduction Internal Fixation (Orif) Tibial Plateau;  Surgeon: Renette Butters, MD;  Location: [place];  Service: Orthopedics;  Laterality: Right;  . RADICAL NECK DISSECTION  10/18/2011   Procedure: RADICAL NECK DISSECTION;  Surgeon: Izora Gala, MD;  Location: [facility];  Service: ENT;  Laterality: Right;  RIGHT MODIFIED NECK DISSECTION /POSSIBLE RIGHT THYROIDECTOM  . RIGHT/LEFT HEART CATH AND CORONARY ANGIOGRAPHY N/A 02/16/2020   Procedure: RIGHT/LEFT HEART CATH AND CORONARY ANGIOGRAPHY;  Surgeon: Leonie Man, MD;  Location: [place] [facility];  Service: Cardiovascular;  Laterality: N/A;  . TEE WITHOUT CARDIOVERSION N/A 03/28/2020   Procedure: TRANSESOPHAGEAL ECHOCARDIOGRAM (TEE);  Surgeon: Buford Dresser, MD;  Location: [facility];   Service: Cardiovascular;  Laterality: N/A;  . THYROIDECTOMY  10/18/2011   Procedure: THYROIDECTOMY WITH RADICAL NECK DISSECTION;  Surgeon: Izora Gala, MD;  Location: [place];  Service: ENT;  Laterality: Right;    There were no vitals filed for this visit.   Subjective Assessment - 10/25/20 1334    Subjective Patient has no complaints today. She reports she is feeling a little stronger then the other day> She reports she flet good after the last visit.    Pertinent History History of lung cancer for 18 years. ORIF Rt tibial plateau 06-11-19, recent hospitalization due to cellulitis and sepsis  See medical hx    Currently in Pain? No/denies    Pain Score 0-No pain    Pain Location Generalized    Pain Orientation Right;Left    Pain Type --   stiffness due to being bedridden   Pain Onset More than a month ago    Multiple Pain Sites Yes    Pain Score 8    Pain Location Ankle    Pain Orientation Left    Pain Descriptors / Indicators Shooting    Pain Type Acute pain    Pain Radiating Towards pain radiating after standing and walking in ll bars    Pain Onset Today    Aggravating Factors  pain increase when dorsiflexing ankle in non wt bearing.                             [place] Adult PT Treatment/Exercise - 10/25/20 0001      Ambulation/Gait   Gait Comments Pt 10 ' x 2 and x  7 feet in llbars with min A and with + 1 with w/c behind for safety.  on last walk pt with ankle pain on L shooting pain so sat in W/c to work on x fers.  Pt needs mod cuing for handplacement forward for ease of x fer to standing.      Therapeutic Activites    Other Therapeutic Activities use of physio ball to forward flex and wt bear in sitting over bil ankles after walking in llbars.  x fer to mat with pt with supervision sliding without assistance from PT R to L.  attempted x fer to Right but pt unable to complete.  Pt with max x a assist x fer by PT without wt bearing and knees to chest x fer with +  1 for assist as needed      Knee/Hip Exercises: Seated   Long Arc Quad Right;Left;3 sets;10 reps    Long Arc Quad Limitations red t band    Other Seated Knee/Hip Exercises ball forward roll for forward unweighting  of seat and wt bear in heels of feet 20 x and ball press for posture    Marching Limitations seated march 2x10      Manual Therapy   Manual Therapy Soft tissue mobilization    Manual therapy comments Pt left ankle assessed and had max motion in all planes.  Pt with irritation on end range DF in achilles tendon                    PT Short Term Goals - 10/18/20 1709      PT SHORT TERM GOAL #1   Title Patient will transfer sit to stand with min a    Baseline min A to modA depending on fatigue level    Time 6    Period Weeks    Status Partially Met    Target Date 10/04/20      PT SHORT TERM GOAL #2   Title Patient will stand for 60 seconds without significant fatigue    Baseline Pt able to wak for 10 feet n llbars    Time 6    Period Weeks    Status Achieved    Target Date 10/04/20      PT SHORT TERM GOAL #3   Title Patient will use walker to take a step    Baseline pt in llbars now    Time 6    Period Weeks    Status On-going    Target Date 10/04/20      PT SHORT TERM GOAL #4   Title Pt will use sliding board and min A from W/C to mat    Baseline Pt xfers with modA but fatigues easily and pushes posteriorly for inefficient x fer/ slinding board not optimal for pt and will continue to strengthen    Time 6    Period Weeks    Status Deferred    Target Date 10/04/20             PT Long Term Goals - 10/25/20 1441      PT LONG TERM GOAL #1   Title Patient will ambulate 74' with mod A and AD    Baseline Pt ambulating 10 feet x 2 in llbars and min  and then additionsl 7 feet    Time 12    Period Weeks    Status On-going      PT LONG TERM GOAL #2   Title Patient will trnasfer from her wheelchair to a regular chair with min gaurd with walker     Baseline Pt requires min to mod A/ fatigues easily    Time 12    Period Weeks    Status On-going      PT LONG TERM GOAL #3   Title Patient will stand for 5 minutes in order to perfrom ADL's at the sink    Baseline Pt able to stand for 60 to 90 sec in llbars    Time 12    Period Weeks    Status On-going      PT LONG TERM GOAL #4   Title Pt will be able to perform full range bridge in order to perform bed mobility with 50% greater ease    Baseline Pt working on supine bridges with 1/2 partial range    Time 12    Period Weeks    Status On-going                 Plan - 10/25/20 1435  Clinical Impression Statement Ms Boileau was able to ambulate in llbars with decreased assistance by PT. Pt requires mod cuiing in order to place hands forward to unweight seat in W/C. Pt able to walk 10 feet x 2 and then 7 feet.  Pt had shooting pain in Left ankle on 3rd walk in llbars.  Pt was assisted to seat to descend. Pt used exercises in seated position and then xfered 1 x.  At end of session pt attempted to transfer and was apprehensive about bearing wt in Left ankle even though PT assessed ankle and was able to move in all planes  of motion.  Pt seemed to have most discomfort with end range DF stretch.  Pt was returned to W/C using xfer by PT without any wt bearing in bil LE by pt .  discussed with husband about ice application and to rest ankle for rest of day. Pt husband is MD and stated he was able to care for wife as necessary at home.  Will continue POC.  Pt was able to tolerate more wt bearing today during ambulation and had decreaed dependence on PT.    Personal Factors and Comorbidities Comorbidity 3+    Comorbidities History of lung cancer for 18 years. ORIF Rt tibial plateau 06-11-19, recent hospitalization due to cellulitis and sepsis  See medical hx    Examination-Activity Limitations Bed Mobility;Bathing;Bend;Carry;Lift;Dressing;Locomotion Level;Squat;Stairs;Stand;Toileting;Transfers     Examination-Participation Restrictions Church;Community Activity;Cleaning;Laundry;Medication Management    PT Treatment/Interventions ADLs/Self Care Home Management;Cryotherapy;Gait training;Stair training;Functional mobility training;Therapeutic activities;Therapeutic exercise;Neuromuscular re-education;Patient/family education;Manual techniques    PT Next Visit Plan xfers and standing tolerance, balance in sitting to improve xfers  review HEP with resistance (red to challenge in sitting)    PT Home Exercise Plan [id number]           Patient will benefit from skilled therapeutic intervention in order to improve the following deficits and impairments:  Abnormal gait,Decreased activity tolerance,Decreased endurance,Difficulty walking,Decreased mobility,Decreased strength,Pain,Postural dysfunction  Visit Diagnosis: Other abnormalities of gait and mobility  Abnormal posture  Muscle weakness (generalized)     Problem List Patient Active Problem List   Diagnosis Date Noted  . Malnutrition of moderate degree 06/13/2020  . Rash and nonspecific skin eruption   . Metastatic malignant neoplasm ([place])   . Failure to thrive in adult   . Asymptomatic bacteriuria   . Pressure injury of skin 06/03/2020  . Cellulitis 06/03/2020  . SVT (supraventricular tachycardia) ([place]) 03/26/2020  . Normocytic anemia 03/24/2020  . Urinary tract infection without hematuria   . AMS (altered mental status) 03/23/2020  . Hypokalemia 03/23/2020  . Prolonged Q-T interval on ECG 03/23/2020  . DNR (do not resuscitate) 03/23/2020  . MRSA bacteremia 03/23/2020  . Acute combined systolic and diastolic heart failure ([place])   . Elevated troponin   . Dilated cardiomyopathy ([place])   . Coronary artery disease involving native coronary artery of native heart without angina pectoris   . Congestive heart failure (CHF) ([place]) 02/14/2020  . Pyelonephritis 09/10/2019  . Acute blood loss anemia 06/12/2019  . Closed fracture of  distal end of right femur, initial encounter ([place]) 06/11/2019  . Closed fracture of lateral portion of right tibial plateau 06/11/2019  . History of DVT of lower extremity 06/11/2019  . Blurry vision 01/06/2019  . Headache 07/24/2016  . Cough 08/24/2015  . Chronic diarrhea 08/24/2015  . Sepsis ([place])   . Encounter for antineoplastic chemotherapy 01/31/2015  . Urinary retention 10/20/2014  . Ileus ([place]) 10/20/2014  .  Cellulitis of leg, right 10/20/2014  . Fracture of right proximal fibula   . Ankle fracture 10/02/2014  . Ankle fracture 10/02/2014  . Lung cancer ([place]) 10/02/2014  . Closed fibular fracture 10/02/2014  . Dementia ([place]) 10/02/2014  . Lower urinary tract infectious disease 10/02/2014  . Reactive airway disease 10/02/2014  . Fall 10/02/2014  . Closed left ankle fracture 10/01/2014  . Right fibular fracture 10/01/2014  . Lung metastasis ([place])   . LESION, ANUS 04/26/2009  . DIARRHEA 04/26/2009  . Primary cancer of right upper lobe of lung ([place]) 04/22/2009  . Malignant neoplasm of brain ([place]) 04/22/2009  . VITAMIN D DEFICIENCY 04/22/2009  . ANAL FISTULA 04/22/2009  . OSTEOPOROSIS 04/22/2009   Voncille Lo, PT, [place] Certified Exercise Expert for the Aging Adult  10/25/20 2:43 PM Phone: [phone] Fax: [phone]  [facility] [address] [place], [place], [address] Phone: [phone]   Fax:  [phone]  Name: Valerie [name] MRN: [id number] Date of Birth: 22-Mar-1946

## 2020-10-27 ENCOUNTER — Ambulatory Visit: Payer: Medicare PPO | Admitting: Physical Therapy

## 2020-11-01 ENCOUNTER — Other Ambulatory Visit: Payer: Self-pay

## 2020-11-01 ENCOUNTER — Ambulatory Visit: Payer: Medicare PPO | Admitting: Physical Therapy

## 2020-11-01 ENCOUNTER — Encounter: Payer: Self-pay | Admitting: Physical Therapy

## 2020-11-01 DIAGNOSIS — R2689 Other abnormalities of gait and mobility: Secondary | ICD-10-CM

## 2020-11-01 DIAGNOSIS — M6281 Muscle weakness (generalized): Secondary | ICD-10-CM

## 2020-11-01 DIAGNOSIS — R293 Abnormal posture: Secondary | ICD-10-CM | POA: Diagnosis not present

## 2020-11-01 NOTE — Patient Instructions (Signed)
      Voncille Lo, PT, [place] Certified Exercise Expert for the Aging Adult  11/01/20 2:10 PM Phone: [phone] Fax: [phone]

## 2020-11-01 NOTE — Therapy (Addendum)
[place], [place], [address] Phone: [phone]   Fax:  [phone]  Physical Therapy Treatment  Patient Details  Name: Valerie [name] MRN: [id number] Date of Birth: Jan 09, 1946 Referring Provider (PT): [name] MD   Encounter Date: 11/01/2020   PT End of Session - 11/01/20 1326    Visit Number 13    Number of Visits 24    Date for PT Re-Evaluation 11/15/20    Authorization Type [employer] MCR  10 VISIT PROGRESS NOTE; KX AT 15    PT Start Time 1330    PT Stop Time 1410    PT Time Calculation (min) 40 min    Activity Tolerance Patient tolerated treatment well;Patient limited by fatigue;Patient limited by pain    Behavior During Therapy [facility] for tasks assessed/performed           Past Medical History:  Diagnosis Date  . Abnormal Pap smear 2006  . Anal fistula   . Ankle fracture   . Anxiety   . Arthritis   . Atrophic vaginitis 2008  . Cataract   . Dementia ([place]) 2009  . Dyspareunia 2008  . H/O osteoporosis   . H/O varicella   . H/O vitamin D deficiency   . Headache 07/24/2016  . Heart murmur   . History of measles, mumps, or rubella   . History of radiation therapy 07/28/13- 08/10/13   right lung metastasis 5000 cGy 10 sessions  . Hypertension   . Lung cancer ([place]) dx'd 2002  . Lung cancer ([place])   . Lung cancer ([place])   . Lung metastasis ([place])    PET scan 05/05/13, RUL lung nodule  . 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 Nodule of right lung CT- 06/03/12   RIGHT UPPER LOBE  . Nodule of right lung 06/03/12   Upper Lobe  . On antineoplastic chemotherapy    TARCEVA  . Osteoporosis 2010  . Primary cancer of right upper lobe of lung ([place]) 04/22/2009   Qualifier: Diagnosis of  By: Nils Pyle CMA ([place]), Mearl Latin    . Shortness of breath    hx lung ca   . Status post chemotherapy 2003   CARBOPLATIN/PACLITAXEL /STATUS POST CLINICAL TRAIL OF  CELEBREX AND IRESSA AT [facility] FOR 1 YEAR  . Status post radiation therapy 2003   LEFT LUNG  . Status post radiation therapy 11/07/2005   WHOLE BRAIN: DR Larkin [facility]  . Status post radiation therapy 06/02/2008   GAMMA KNIFE OF RESECTED CAVITAY  . Thyroid adenoma    ?  [name] Thyroid cancer ([place]) 10/18/11 bx   adenoid nodules   . Yeast infection     Past Surgical History:  Procedure Laterality Date  . BRAIN SURGERY    . INTRAVASCULAR ULTRASOUND/IVUS N/A 02/16/2020   Procedure: Intravascular Ultrasound/IVUS;  Surgeon: Leonie Man, MD;  Location: [place] [facility];  Service: Cardiovascular;  Laterality: N/A;  . LEFT LOWER LOBECTOMY  05/2001  . LEFT OCCIPITAL CRANIOTOMY  09/21/2005   tumor  . LUNG LOBECTOMY    . NECK SURGERY    . ORIF ANKLE FRACTURE Left 10/02/2014   Procedure: OPEN REDUCTION INTERNAL FIXATION (ORIF) ANKLE FRACTURE;  Surgeon: Alta Corning, MD;  Location: [facility];  Service: Orthopedics;  Laterality: Left;  . ORIF FEMUR FRACTURE Right 06/11/2019   Procedure: OPEN REDUCTION INTERNAL FIXATION (ORIF) DISTAL FEMUR FRACTURE;  Surgeon: Renette Butters, MD;  Location:  [place] OR;  Service: Orthopedics;  Laterality: Right;  . ORIF TIBIA PLATEAU Right 06/11/2019   Procedure: Open Reduction Internal Fixation (Orif) Tibial Plateau;  Surgeon: Renette Butters, MD;  Location: [place];  Service: Orthopedics;  Laterality: Right;  . RADICAL NECK DISSECTION  10/18/2011   Procedure: RADICAL NECK DISSECTION;  Surgeon: Izora Gala, MD;  Location: [place];  Service: ENT;  Laterality: Right;  RIGHT MODIFIED NECK DISSECTION /POSSIBLE RIGHT THYROIDECTOM  . RIGHT/LEFT HEART CATH AND CORONARY ANGIOGRAPHY N/A 02/16/2020   Procedure: RIGHT/LEFT HEART CATH AND CORONARY ANGIOGRAPHY;  Surgeon: Leonie Man, MD;  Location: [place] CV LAB;  Service: Cardiovascular;  Laterality: N/A;  . TEE WITHOUT CARDIOVERSION N/A 03/28/2020   Procedure: TRANSESOPHAGEAL ECHOCARDIOGRAM (TEE);  Surgeon: Buford Dresser,  MD;  Location: [facility] ENDOSCOPY;  Service: Cardiovascular;  Laterality: N/A;  . THYROIDECTOMY  10/18/2011   Procedure: THYROIDECTOMY WITH RADICAL NECK DISSECTION;  Surgeon: Izora Gala, MD;  Location: [place];  Service: ENT;  Laterality: Right;    There were no vitals filed for this visit.   Subjective Assessment - 11/01/20 1325    Subjective Pt still has pain in Left ankle, trouble bearing wt in heel  and has soft brace.    Patient is accompained by: Family member   husband   Pertinent History History of lung cancer for 18 years. ORIF Rt tibial plateau 06-11-19, recent hospitalization due to cellulitis and sepsis  See medical hx    How long can you sit comfortably? can sit in a wheelchar without difficulty    Patient Stated Goals to be able to walk to the bathroom, to be able to transfer , to be able to stand on my own    Currently in Pain? Yes    Pain Score 5    pain bearing wt in ankle 9/10 at worst   Pain Location Ankle    Pain Orientation Left    Pain Descriptors / Indicators Sharp    Pain Onset More than a month ago                             [facility] Adult PT Treatment/Exercise - 11/01/20 0001      Transfers   Transfers Stand Pivot Transfers    Stand Pivot Transfers 3: Mod assist   with +1 for supervision for W/C   Stand Pivot Transfer Details (indicate cue type and reason) Pt x fer from w/C to chair with hand extended to arm rest,  Pt tends to push against forward motion inhibiting smooth x fer, but when leaning forward over knees tends to stand pivot with relative ease    Comments stand pivot to R x 2 and to Left x 2      Ambulation/Gait   Gait Comments Pt in llbars with+ 2 A for safety to follow with w/c  Needs rest between every effort.  Pt walks 3 steps and must sit down, stands for 36 seconds with rest , 59 sec standing with wt shit in llbars, 70 sec with wt shift forward backward, ,sit then 36 sec side to side wt shift.      Knee/Hip Exercises: Seated   Long  Arc Quad Right;Left;3 sets;10 reps    Other Seated Knee/Hip Exercises reissted knee flexion x 10 on R and L with PT offering resistance.  then marching in seated with PT resistance 10 x R and Left   70 sec standing with wt shift  side to side and rotation of hips.    Marching Limitations seated march 2x10                  PT Education - 11/01/20 1410    Education Details transfers with forward lean and arm extended., pre gait activites in the llbars    Person(s) Educated Patient    Methods Explanation;Demonstration;Handout;Tactile cues;Verbal cues    Comprehension Verbalized understanding;Returned demonstration            PT Short Term Goals - 11/01/20 1422      PT SHORT TERM GOAL #1   Period Weeks             PT Long Term Goals - 11/01/20 1423      PT LONG TERM GOAL #1   Title Patient will ambulate 32' with mod A and AD    Baseline Pt unable to ambulate more than 3 steps due to ankle pain on L    Time 12    Period Weeks    Status On-going      PT LONG TERM GOAL #2   Title Patient will trnasfer from her wheelchair to a regular chair with min gaurd with walker    Baseline Pt requires min to mod A/ fatigues easily with stand pivot    Time 12    Period Weeks    Status On-going      PT LONG TERM GOAL #3   Title Patient will stand for 5 minutes in order to perfrom ADL's at the sink    Baseline Pt only able to stand for max 70 sec today due to ankle pain    Time 12    Period Weeks    Status On-going      PT LONG TERM GOAL #4   Title Pt will be able to perform full range bridge in order to perform bed mobility with 50% greater ease    Baseline Pt working on supine bridges with 1/2 partial range from 10-25-20    Time 12    Period Weeks    Status On-going            Access Code: [email or web address]: [email or web address]: 03/15/2022Prepared by: Lesly Rubenstein Notes All standing must have someone assisting you and having counter/walker/person  assist Exercises   Please only do standing exercises with assistance for safety  Forward Backward Weight Shift with Counter Support - 1 x daily - 7 x weekly - 3 sets - 10 reps  Forward Backward Weight Shift with Counter Support - 1 x daily - 7 x weekly - 3 sets - 10 reps Patient Education  How to Prevent Falls       Plan - 11/01/20 1402    Clinical Impression Statement Valerie [name] complained of left ankle pain 9/10 yesterday but only 5/10 when wt bearing in llbars.  Pt has not achieved any LTG as of yet.  Today discussed with husband and pt benefit vs cost of time with transportation and energy spent for benefit of PT if Valerie [name] is not able to participate fully  in transfer, gait activities.  Pt has limited tolerance for standing today , not more than 70 seconds at a time.   today pt able to transfer x 2 to R and to left stand pivot requiring mod A and vC and TC to help pt place hands and lean forward to unweight seat for stand pivot xfer.  Pt fatigues easitly and fear of falling prevent more systematic  progress.  Pt / husband explained that pt needs to be making progress and that she has HEP to use at home.  Will evaluate next visit if benefit vs/ energy time expenditure beneficial at this time.    Personal Factors and Comorbidities Comorbidity 3+    Comorbidities History of lung cancer for 18 years. ORIF Rt tibial plateau 06-11-19, recent hospitalization due to cellulitis and sepsis  See medical hx    Examination-Activity Limitations Bed Mobility;Bathing;Bend;Carry;Lift;Dressing;Locomotion Level;Squat;Stairs;Stand;Toileting;Transfers    Examination-Participation Restrictions Church;Community Activity;Cleaning;Laundry;Medication Management    PT Treatment/Interventions ADLs/Self Care Home Management;Cryotherapy;Gait training;Stair training;Functional mobility training;Therapeutic activities;Therapeutic exercise;Neuromuscular re-education;Patient/family education;Manual techniques    PT Next Visit  Plan xfers and standing tolerance, balance in sitting to improve xfers  review HEP with resistance (red to challenge in sitting)    PT Home Exercise Plan [email or web address]    Consulted and Agree with Plan of Care Patient           Patient will benefit from skilled therapeutic intervention in order to improve the following deficits and impairments:  Abnormal gait,Decreased activity tolerance,Decreased endurance,Difficulty walking,Decreased mobility,Decreased strength,Pain,Postural dysfunction  Visit Diagnosis: Other abnormalities of gait and mobility  Abnormal posture  Muscle weakness (generalized)  Check all possible CPT codes:      [x]  97110 (Therapeutic Exercise)  []  92507 (SLP Treatment)  [x]  97112 (Neuro Re-ed)   []  92526 (Swallowing Treatment)   [x]  97116 (Gait Training)   []  [id number] (Cognitive Training, 1st 15 minutes) [x]  97140 (Manual Therapy)   []  97130 (Cognitive Training, each add'l 15 minutes)  [x]  97530 (Therapeutic Activities)  []  Other, List CPT Code ____________    []  67124 (Self Care)       [x]  All codes above (97110 - 97535)  []  97012 (Mechanical Traction)  []  97014 (E-stim Unattended)  []  97032 (E-stim manual)  []  97033 (Ionto)  []  97035 (Ultrasound)  []  97760 (Orthotic Fit) []  [id number] (Physical Performance Training) []  [id number] (Aquatic Therapy) []  97034 (Contrast Bath) []  [id number] (Paraffin) []  97597 (Wound Care 1st 20 sq cm) []  97598 (Wound Care each add'l 20 sq cm) []  97016 (Vasopneumatic Device) []  [id number] (Orthotic Training) []  [id number] (Prosthetic Training)    Problem List Patient Active Problem List   Diagnosis Date Noted  . Malnutrition of moderate degree 06/13/2020  . Rash and nonspecific skin eruption   . Metastatic malignant neoplasm ([place])   . Failure to thrive in adult   . Asymptomatic bacteriuria   . Pressure injury of skin 06/03/2020  . Cellulitis 06/03/2020  . SVT (supraventricular tachycardia) ([place]) 03/26/2020  . Normocytic anemia 03/24/2020  .  Urinary tract infection without hematuria   . AMS (altered mental status) 03/23/2020  . Hypokalemia 03/23/2020  . Prolonged Q-T interval on ECG 03/23/2020  . DNR (do not resuscitate) 03/23/2020  . MRSA bacteremia 03/23/2020  . Acute combined systolic and diastolic heart failure ([place])   . Elevated troponin   . Dilated cardiomyopathy ([place])   . Coronary artery disease involving native coronary artery of native heart without angina pectoris   . Congestive heart failure (CHF) ([place]) 02/14/2020  . Pyelonephritis 09/10/2019  . Acute blood loss anemia 06/12/2019  . Closed fracture of distal end of right femur, initial encounter ([place]) 06/11/2019  . Closed fracture of lateral portion of right tibial plateau 06/11/2019  . History of DVT of lower extremity 06/11/2019  . Blurry vision 01/06/2019  . Headache 07/24/2016  . Cough 08/24/2015  . Chronic diarrhea 08/24/2015  . Sepsis ([place])   .  Encounter for antineoplastic chemotherapy 01/31/2015  . Urinary retention 10/20/2014  . Ileus ([place]) 10/20/2014  . Cellulitis of leg, right 10/20/2014  . Fracture of right proximal fibula   . Ankle fracture 10/02/2014  . Ankle fracture 10/02/2014  . Lung cancer ([place]) 10/02/2014  . Closed fibular fracture 10/02/2014  . Dementia ([place]) 10/02/2014  . Lower urinary tract infectious disease 10/02/2014  . Reactive airway disease 10/02/2014  . Fall 10/02/2014  . Closed left ankle fracture 10/01/2014  . Right fibular fracture 10/01/2014  . Lung metastasis ([place])   . LESION, ANUS 04/26/2009  . DIARRHEA 04/26/2009  . Primary cancer of right upper lobe of lung ([place]) 04/22/2009  . Malignant neoplasm of brain ([place]) 04/22/2009  . VITAMIN D DEFICIENCY 04/22/2009  . ANAL FISTULA 04/22/2009  . OSTEOPOROSIS 04/22/2009   Voncille Lo, PT, [place] Certified Exercise Expert for the Aging Adult  11/01/20 2:36 PM Phone: [phone] Fax: [facility] [address] [place], [place], [address] Phone: [phone]   Fax:  [phone]  Name: Valerie [name] MRN: [id number] Date of Birth: 04/19/1946

## 2020-11-03 ENCOUNTER — Ambulatory Visit: Payer: Medicare PPO | Admitting: Physical Therapy

## 2020-11-03 ENCOUNTER — Encounter: Payer: Self-pay | Admitting: Physical Therapy

## 2020-11-03 ENCOUNTER — Other Ambulatory Visit: Payer: Self-pay

## 2020-11-03 DIAGNOSIS — M6281 Muscle weakness (generalized): Secondary | ICD-10-CM

## 2020-11-03 DIAGNOSIS — R293 Abnormal posture: Secondary | ICD-10-CM | POA: Diagnosis not present

## 2020-11-03 DIAGNOSIS — R2689 Other abnormalities of gait and mobility: Secondary | ICD-10-CM | POA: Diagnosis not present

## 2020-11-03 NOTE — Therapy (Addendum)
[place], [place], [address] Phone: [phone]   Fax:  [phone]  Physical Therapy Treatment/Discharge   Patient Details  Name: Valerie [name] MRN: [id number] Date of Birth: 1946-08-11 Referring Provider (PT): [name] MD   Encounter Date: 11/03/2020   PT End of Session - 11/03/20 1335    Visit Number 14    Number of Visits 24    Date for PT Re-Evaluation 11/15/20    Authorization Type [employer] MCR  10 VISIT PROGRESS NOTE; KX AT 15    PT Start Time 1330    PT Stop Time 1410    PT Time Calculation (min) 40 min    Activity Tolerance Patient tolerated treatment well;Patient limited by fatigue;Patient limited by pain    Behavior During Therapy [facility] for tasks assessed/performed           Past Medical History:  Diagnosis Date  . Abnormal Pap smear 2006  . Anal fistula   . Ankle fracture   . Anxiety   . Arthritis   . Atrophic vaginitis 2008  . Cataract   . Dementia ([place]) 2009  . Dyspareunia 2008  . H/O osteoporosis   . H/O varicella   . H/O vitamin D deficiency   . Headache 07/24/2016  . Heart murmur   . History of measles, mumps, or rubella   . History of radiation therapy 07/28/13- 08/10/13   right lung metastasis 5000 cGy 10 sessions  . Hypertension   . Lung cancer ([place]) dx'd 2002  . Lung cancer ([place])   . Lung cancer ([place])   . Lung metastasis ([place])    PET scan 05/05/13, RUL lung nodule  . 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 Nodule of right lung CT- 06/03/12   RIGHT UPPER LOBE  . Nodule of right lung 06/03/12   Upper Lobe  . On antineoplastic chemotherapy    TARCEVA  . Osteoporosis 2010  . Primary cancer of right upper lobe of lung ([place]) 04/22/2009   Qualifier: Diagnosis of  By: Nils Pyle CMA ([place]), Mearl Latin    . Shortness of breath    hx lung ca   . Status post chemotherapy 2003   CARBOPLATIN/PACLITAXEL /STATUS POST CLINICAL  TRAIL OF CELEBREX AND IRESSA AT [facility] FOR 1 YEAR  . Status post radiation therapy 2003   LEFT LUNG  . Status post radiation therapy 11/07/2005   WHOLE BRAIN: DR Larkin [facility]  . Status post radiation therapy 06/02/2008   GAMMA KNIFE OF RESECTED CAVITAY  . Thyroid adenoma    ?  [name] Thyroid cancer ([place]) 10/18/11 bx   adenoid nodules   . Yeast infection     Past Surgical History:  Procedure Laterality Date  . BRAIN SURGERY    . INTRAVASCULAR ULTRASOUND/IVUS N/A 02/16/2020   Procedure: Intravascular Ultrasound/IVUS;  Surgeon: Leonie Man, MD;  Location: [place] CV [facility];  Service: Cardiovascular;  Laterality: N/A;  . LEFT LOWER LOBECTOMY  05/2001  . LEFT OCCIPITAL CRANIOTOMY  09/21/2005   tumor  . LUNG LOBECTOMY    . NECK SURGERY    . ORIF ANKLE FRACTURE Left 10/02/2014   Procedure: OPEN REDUCTION INTERNAL FIXATION (ORIF) ANKLE FRACTURE;  Surgeon: Alta Corning, MD;  Location: [facility];  Service: Orthopedics;  Laterality: Left;  . ORIF FEMUR FRACTURE Right 06/11/2019   Procedure: OPEN REDUCTION INTERNAL FIXATION (ORIF) DISTAL FEMUR FRACTURE;  Surgeon: Renette Butters, MD;  Location: [place];  Service: Orthopedics;  Laterality: Right;  . ORIF TIBIA PLATEAU Right 06/11/2019   Procedure: Open Reduction Internal Fixation (Orif) Tibial Plateau;  Surgeon: Renette Butters, MD;  Location: [place];  Service: Orthopedics;  Laterality: Right;  . RADICAL NECK DISSECTION  10/18/2011   Procedure: RADICAL NECK DISSECTION;  Surgeon: Izora Gala, MD;  Location: [place];  Service: ENT;  Laterality: Right;  RIGHT MODIFIED NECK DISSECTION /POSSIBLE RIGHT THYROIDECTOM  . RIGHT/LEFT HEART CATH AND CORONARY ANGIOGRAPHY N/A 02/16/2020   Procedure: RIGHT/LEFT HEART CATH AND CORONARY ANGIOGRAPHY;  Surgeon: Leonie Man, MD;  Location: [place] [facility];  Service: Cardiovascular;  Laterality: N/A;  . TEE WITHOUT CARDIOVERSION N/A 03/28/2020   Procedure: TRANSESOPHAGEAL ECHOCARDIOGRAM (TEE);  Surgeon: Buford Dresser, MD;  Location: [facility] ENDOSCOPY;  Service: Cardiovascular;  Laterality: N/A;  . THYROIDECTOMY  10/18/2011   Procedure: THYROIDECTOMY WITH RADICAL NECK DISSECTION;  Surgeon: Izora Gala, MD;  Location: [place];  Service: ENT;  Laterality: Right;    There were no vitals filed for this visit.   Subjective Assessment - 11/03/20 1537    Subjective Patient continues to report sharp pains in her left foot. it is hurting her at night. It also hurts when she weight bears.    Pertinent History History of lung cancer for 18 years. ORIF Rt tibial plateau 06-11-19, recent hospitalization due to cellulitis and sepsis  See medical hx    How long can you sit comfortably? can sit in a wheelchar without difficulty    How long can you stand comfortably? stands with help    How long can you walk comfortably? walked with help by PT only    Diagnostic tests recent hospitalization    Patient Stated Goals to be able to walk to the bathroom, to be able to transfer , to be able to stand on my own    Currently in Pain? No/denies    Pain Score 5    pain reaches higher levels in weight bearing   Pain Location Ankle    Pain Orientation Left    Pain Onset More than a month ago    Multiple Pain Sites No                             [facility] Adult PT Treatment/Exercise - 11/03/20 0001      Transfers   Comments trialed sit to stand 2x10. on the second trial the patient had sharp pain into her heel and could not stand more then a few seconds      Knee/Hip Exercises: Seated   Long Arc Quad Right;Left;3 sets;10 reps    Other Seated Knee/Hip Exercises reissted knee flexion x 10 on R and L with PT offering resistance.  then marching in seated with PT resistance 10 x R and Left   70 sec standing with wt shift side to side and rotation of hips.    Marching Limitations seated march 2x10      Shoulder Exercises: Seated   Other Seated Exercises triceps extension 3x10 red; bilateral er 3x10; shpoulder  flexion wand 3x10 2lb    Other Seated Exercises biceps curls 2x15 2lb                  PT Education - 11/03/20 1538    Education Details HEP and symptom management    Person(s) Educated Patient    Methods Explanation;Demonstration;Tactile cues;Verbal cues    Comprehension  Verbalized understanding;Returned demonstration;Verbal cues required;Tactile cues required            PT Short Term Goals - 11/01/20 1422      PT SHORT TERM GOAL #1   Period Weeks             PT Long Term Goals - 11/01/20 1423      PT LONG TERM GOAL #1   Title Patient will ambulate 25' with mod A and AD    Baseline Pt unable to ambulate more than 3 steps due to ankle pain on L    Time 12    Period Weeks    Status On-going      PT LONG TERM GOAL #2   Title Patient will trnasfer from her wheelchair to a regular chair with min gaurd with walker    Baseline Pt requires min to mod A/ fatigues easily with stand pivot    Time 12    Period Weeks    Status On-going      PT LONG TERM GOAL #3   Title Patient will stand for 5 minutes in order to perfrom ADL's at the sink    Baseline Pt only able to stand for max 70 sec today due to ankle pain    Time 12    Period Weeks    Status On-going      PT LONG TERM GOAL #4   Title Pt will be able to perform full range bridge in order to perform bed mobility with 50% greater ease    Baseline Pt working on supine bridges with 1/2 partial range from 10-25-20    Time 12    Period Weeks    Status On-going                 Plan - 11/03/20 1353    Clinical Impression Statement Patient continues to have significant sharp pain hne standing. She stood the first time and had mild pain. On the second trial she had more shrp pain in her ankle. It has been the same type of pain since 10/25/2020. She was advised it may be time to check it out. She did her exercises biut she is able to do her exercises at home. She is coming here for weight bearing and transfers. She  was advised until we are sure that there are no fractures we will put her on hold. if it was improving we could justify continued work. she tolerated ther-ex well. she hassomeone coming to her house 3x a week for ther-ex. Per aplation patient had significant pain in her calcaneus.    Comorbidities History of lung cancer for 18 years. ORIF Rt tibial plateau 06-11-19, recent hospitalization due to cellulitis and sepsis  See medical hx    Examination-Activity Limitations Bed Mobility;Bathing;Bend;Carry;Lift;Dressing;Locomotion Level;Squat;Stairs;Stand;Toileting;Transfers    Examination-Participation Restrictions Church;Community Activity;Cleaning;Laundry;Medication Management    Stability/Clinical Decision Making Unstable/Unpredictable    Clinical Decision Making High    Rehab Potential Fair    PT Frequency 2x / week    PT Duration 12 weeks    PT Treatment/Interventions ADLs/Self Care Home Management;Cryotherapy;Gait training;Stair training;Functional mobility training;Therapeutic activities;Therapeutic exercise;Neuromuscular re-education;Patient/family education;Manual techniques           Patient will benefit from skilled therapeutic intervention in order to improve the following deficits and impairments:  Abnormal gait,Decreased activity tolerance,Decreased endurance,Difficulty walking,Decreased mobility,Decreased strength,Pain,Postural dysfunction  Visit Diagnosis: Other abnormalities of gait and mobility  Abnormal posture  Muscle weakness (generalized)     Problem List Patient  Active Problem List   Diagnosis Date Noted  . Malnutrition of moderate degree 06/13/2020  . Rash and nonspecific skin eruption   . Metastatic malignant neoplasm ([place])   . Failure to thrive in adult   . Asymptomatic bacteriuria   . Pressure injury of skin 06/03/2020  . Cellulitis 06/03/2020  . SVT (supraventricular tachycardia) ([place]) 03/26/2020  . Normocytic anemia 03/24/2020  . Urinary tract infection  without hematuria   . AMS (altered mental status) 03/23/2020  . Hypokalemia 03/23/2020  . Prolonged Q-T interval on ECG 03/23/2020  . DNR (do not resuscitate) 03/23/2020  . MRSA bacteremia 03/23/2020  . Acute combined systolic and diastolic heart failure ([place])   . Elevated troponin   . Dilated cardiomyopathy ([place])   . Coronary artery disease involving native coronary artery of native heart without angina pectoris   . Congestive heart failure (CHF) ([place]) 02/14/2020  . Pyelonephritis 09/10/2019  . Acute blood loss anemia 06/12/2019  . Closed fracture of distal end of right femur, initial encounter ([place]) 06/11/2019  . Closed fracture of lateral portion of right tibial plateau 06/11/2019  . History of DVT of lower extremity 06/11/2019  . Blurry vision 01/06/2019  . Headache 07/24/2016  . Cough 08/24/2015  . Chronic diarrhea 08/24/2015  . Sepsis ([place])   . Encounter for antineoplastic chemotherapy 01/31/2015  . Urinary retention 10/20/2014  . Ileus ([place]) 10/20/2014  . Cellulitis of leg, right 10/20/2014  . Fracture of right proximal fibula   . Ankle fracture 10/02/2014  . Ankle fracture 10/02/2014  . Lung cancer ([place]) 10/02/2014  . Closed fibular fracture 10/02/2014  . Dementia ([place]) 10/02/2014  . Lower urinary tract infectious disease 10/02/2014  . Reactive airway disease 10/02/2014  . Fall 10/02/2014  . Closed left ankle fracture 10/01/2014  . Right fibular fracture 10/01/2014  . Lung metastasis ([place])   . LESION, ANUS 04/26/2009  . DIARRHEA 04/26/2009  . Primary cancer of right upper lobe of lung ([place]) 04/22/2009  . Malignant neoplasm of brain ([place]) 04/22/2009  . VITAMIN D DEFICIENCY 04/22/2009  . ANAL FISTULA 04/22/2009  . OSTEOPOROSIS 04/22/2009   PHYSICAL THERAPY DISCHARGE SUMMARY  Visits from Start of Care: 14  Current functional level related to goals / functional outcomes: Was working on standing until she hurt her ankle   Remaining deficits: Improved ability to  transfer and ambualte but still needs significant assistance   Education / Equipment: HEP   Plan: Patient agrees to discharge.  Patient goals were not met. Patient is being discharged due to a change in medical status.  ?????      Carney Living PT DPT  11/03/2020, 3:40 PM  [facility] [address] [place], [place], [address] Phone: [phone]   Fax:  [phone]  Name: TYONA NILSEN MRN: [id number] Date of Birth: [birth year]

## 2020-11-08 ENCOUNTER — Ambulatory Visit: Payer: Medicare PPO | Admitting: Physical Therapy

## 2020-11-09 DIAGNOSIS — M25372 Other instability, left ankle: Secondary | ICD-10-CM | POA: Diagnosis not present

## 2020-11-09 DIAGNOSIS — M25572 Pain in left ankle and joints of left foot: Secondary | ICD-10-CM | POA: Diagnosis not present

## 2020-11-10 ENCOUNTER — Ambulatory Visit: Payer: Medicare PPO | Admitting: Physical Therapy

## 2020-11-15 ENCOUNTER — Telehealth: Payer: Self-pay | Admitting: Physical Therapy

## 2020-11-15 ENCOUNTER — Other Ambulatory Visit (HOSPITAL_COMMUNITY): Payer: Self-pay

## 2020-11-15 ENCOUNTER — Encounter: Payer: Medicare PPO | Admitting: Physical Therapy

## 2020-11-15 NOTE — Telephone Encounter (Signed)
Dr Amadeo Garnet answered phone and PT checked on patient about pain in left ankle Dr Amadeo Garnet reports Ms Debose has no fracture but she was given a sturdy ankle brace and was able to stand 2.5 minutes with help of friend that is a physical therapist at home.  Due to pt fatigue level, pt will remain at home to do HEP and will continue to work with friend in home environment.  HHPT may be better alternative due to endurance/fatigue since she was taking transportation to receive PT at clinic   Will DC pt as per pt/husband request as per discussion above.  Voncille Lo, PT, [place] Certified Exercise Expert for the Aging Adult  11/15/20 8:40 AM Phone: [phone] Fax: [phone]

## 2020-11-17 ENCOUNTER — Ambulatory Visit (INDEPENDENT_AMBULATORY_CARE_PROVIDER_SITE_OTHER): Payer: Medicare PPO

## 2020-11-17 ENCOUNTER — Telehealth: Payer: Medicare PPO

## 2020-11-17 DIAGNOSIS — F0390 Unspecified dementia without behavioral disturbance: Secondary | ICD-10-CM | POA: Diagnosis not present

## 2020-11-17 DIAGNOSIS — R5381 Other malaise: Secondary | ICD-10-CM

## 2020-11-17 DIAGNOSIS — C3411 Malignant neoplasm of upper lobe, right bronchus or lung: Secondary | ICD-10-CM | POA: Diagnosis not present

## 2020-11-17 DIAGNOSIS — N390 Urinary tract infection, site not specified: Secondary | ICD-10-CM

## 2020-11-17 DIAGNOSIS — R269 Unspecified abnormalities of gait and mobility: Secondary | ICD-10-CM

## 2020-11-20 DIAGNOSIS — R54 Age-related physical debility: Secondary | ICD-10-CM | POA: Diagnosis not present

## 2020-11-23 ENCOUNTER — Other Ambulatory Visit (HOSPITAL_COMMUNITY): Payer: Self-pay

## 2020-11-24 NOTE — Patient Instructions (Signed)
Goals Addressed    . Follow My Treatment Plan-Chronic Kidney   On track    Timeframe:  Long-Range Goal Priority:  Medium Start Date:  11/17/20                         Expected End Date:  05/19/21                     Follow Up Date: 02/16/21  .  Continue to adhere to MD recommendations for CKD  . Continue to keep all scheduled follow up appointments . Take medications as directed  . Let your healthcare team know if you are unable to take your medications . Call your pharmacy for refills at least 7 days prior to running out of medication . Increase your water intake unless otherwise directed   Why is this important?    Staying as healthy as you can is very important. This may mean making changes if you smoke, don't exercise or eat poorly.   A healthy lifestyle is an important goal for you.   Following the treatment plan and making changes may be hard.   Try some of these steps to help keep the disease from getting worse.     Notes:     . Follow My Treatment Plan-Oral Chemotherapy Adherence   On track    Timeframe:  Long-Range Goal Priority:  High Start Date: 11/17/20                            Expected End Date: 05/19/21                     Follow Up Date: 02/16/21   - ask for help if I cannot afford the medicine - call for medicine refill 2 or 3 days before it runs out - call if I am sick and can not take my medications - call the doctor or nurse before I stop taking medicine - call the doctor or nurse to get help with side effects - keep a list of all the medicines I take; vitamins and herbals too - keep follow-up appointments - use a pillbox to sort medicine - use an alarm clock or phone to remind me to take my medicine    Why is this important?    Following your treatment plan will help keep your care on track.   Medicine may be the most important piece of your plan.   There are many reasons why you might want to stop taking medicine. You may get tired of taking your  medicine. You may think medicine costs too much money. You may find the side effects are too much to bear.   Try some of these steps to make following the treatment plan a Valerie [name] easier.     Notes:     [name] Manage Fatigue (Tiredness- Cancer Treatment)   On track    Timeframe:  Long-Range Goal Priority:  Low Start Date: 11/17/20                            Expected End Date: 05/19/21                       Follow Up Date: 02/16/21   - eat healthy - get a least 8 hours of sleep at night - get  outdoors every day (weather permitting) - limit daytime naps - take a warm shower or bath before bed - use devices that will help like a cane, sock-puller or reacher - use meditation or relaxation techniques    Why is this important?   Cancer treatment and its side effects can drain your energy. It can keep you from doing things you would like to do.  There are many things that you can do to manage fatigue.    Notes:     . Track Symptoms-Urinary Incontinence   On track    Timeframe:  Long-Range Goal Priority:  Low Start Date: 11/17/20                            Expected End Date:  05/19/21                     Follow Up Date: 02/16/21   - track number of times I do not get to the bathroom in time - track when, how much I go    Why is this important?    There are many reasons you may leak urine (pee). Keeping a diary of symptoms can help you figure out why. This will help you and your doctor come up with a treatment plan.    Notes:

## 2020-11-24 NOTE — Chronic Care Management (AMB) (Signed)
Chronic Care Management   CCM RN Visit Note  11/17/2020 Name: Valerie [name] MRN: [id number] DOB: 07-03-1946  Subjective: Valerie [name] is a 75 y.o. year old female who is a primary care patient of Dorothyann Peng, MD. The care management team was consulted for assistance with disease management and care coordination needs.    Engaged with patient by telephone for follow up visit in response to provider referral for case management and/or care coordination services.   Consent to Services:  The patient was given information about Chronic Care Management services, agreed to services, and gave verbal consent prior to initiation of services.  Please see initial visit note for detailed documentation.   Patient agreed to services and verbal consent obtained.   Assessment: Review of patient past medical history, allergies, medications, health status, including review of consultants reports, laboratory and other test data, was performed as part of comprehensive evaluation and provision of chronic care management services.   SDOH (Social Determinants of Health) assessments and interventions performed:    CCM Care Plan  No Known Allergies  Outpatient Encounter Medications as of 11/17/2020  Medication Sig Note  . Ascorbic Acid (VITAMIN C) 100 MG tablet Take 100 mg by mouth every evening. Gummie   . Cholecalciferol (D3 PO) Take 1 capsule by mouth daily.   . diphenhydrAMINE (BENADRYL) 25 MG tablet Take 25 mg by mouth every 6 (six) hours as needed for allergies.   [name] donepezil (ARICEPT) 10 MG tablet Take 10 mg by mouth at bedtime.   . erlotinib (TARCEVA) 100 MG tablet TAKE 1 TABLET (100 MG TOTAL) BY MOUTH DAILY. TAKE ON AN EMPTY STOMACH 1 HOUR BEFORE MEALS OR 2 HOURS AFTER   . hydrocerin (EUCERIN) CREA Apply 1 application topically 2 (two) times daily. To back and buttocks.   . loperamide (IMODIUM A-D) 2 MG tablet Take 4 mg by mouth in the morning and at bedtime.   [name] MAGNESIUM PO Take 1  tablet by mouth daily.   . metoprolol succinate (TOPROL-XL) 25 MG 24 hr tablet Take 1 tablet (25 mg total) by mouth daily.   . Multiple Vitamin (MULTI-VITAMINS) TABS Take 1 tablet by mouth every evening.    . potassium chloride SA (KLOR-CON) 20 MEQ tablet Take 20 mEq by mouth daily. 06/03/2020: Non-compliance  . prochlorperazine (COMPAZINE) 10 MG tablet Take 1 tablet (10 mg total) by mouth every 6 (six) hours as needed for nausea or vomiting.   . vitamin B-12 (CYANOCOBALAMIN) 1000 MCG tablet Take 1,000 mcg by mouth every evening.   Carlena Hurl 20 MG TABS tablet Take 1 tablet (20 mg total) by mouth every evening.    No facility-administered encounter medications on file as of 11/17/2020.    Patient Active Problem List   Diagnosis Date Noted  . Malnutrition of moderate degree 06/13/2020  . Rash and nonspecific skin eruption   . Metastatic malignant neoplasm (HCC)   . Failure to thrive in adult   . Asymptomatic bacteriuria   . Pressure injury of skin 06/03/2020  . Cellulitis 06/03/2020  . SVT (supraventricular tachycardia) (HCC) 03/26/2020  . Normocytic anemia 03/24/2020  . Urinary tract infection without hematuria   . AMS (altered mental status) 03/23/2020  . Hypokalemia 03/23/2020  . Prolonged Q-T interval on ECG 03/23/2020  . DNR (do not resuscitate) 03/23/2020  . MRSA bacteremia 03/23/2020  . Acute combined systolic and diastolic heart failure (HCC)   . Elevated troponin   . Dilated cardiomyopathy (HCC)   . Coronary artery  disease involving native coronary artery of native heart without angina pectoris   . Congestive heart failure (CHF) ([place]) 02/14/2020  . Pyelonephritis 09/10/2019  . Acute blood loss anemia 06/12/2019  . Closed fracture of distal end of right femur, initial encounter ([place]) 06/11/2019  . Closed fracture of lateral portion of right tibial plateau 06/11/2019  . History of DVT of lower extremity 06/11/2019  . Blurry vision 01/06/2019  . Headache 07/24/2016  . Cough  08/24/2015  . Chronic diarrhea 08/24/2015  . Sepsis ([place])   . Encounter for antineoplastic chemotherapy 01/31/2015  . Urinary retention 10/20/2014  . Ileus ([place]) 10/20/2014  . Cellulitis of leg, right 10/20/2014  . Fracture of right proximal fibula   . Ankle fracture 10/02/2014  . Ankle fracture 10/02/2014  . Lung cancer ([place]) 10/02/2014  . Closed fibular fracture 10/02/2014  . Dementia ([place]) 10/02/2014  . Lower urinary tract infectious disease 10/02/2014  . Reactive airway disease 10/02/2014  . Fall 10/02/2014  . Closed left ankle fracture 10/01/2014  . Right fibular fracture 10/01/2014  . Lung metastasis ([place])   . LESION, ANUS 04/26/2009  . DIARRHEA 04/26/2009  . Primary cancer of right upper lobe of lung ([place]) 04/22/2009  . Malignant neoplasm of brain ([place]) 04/22/2009  . VITAMIN D DEFICIENCY 04/22/2009  . ANAL FISTULA 04/22/2009  . OSTEOPOROSIS 04/22/2009    Conditions to be addressed/monitored:Malignant Neoplasm of upper lobe of right lung; Dementia w/o behavior disturbance, unspecified dementia type, Gait abnormality, Debility, Recurrent UTI  Care Plan : Chronic Kidney (Adult)  Updates made by Lynne Logan, RN since 11/24/2020 12:00 AM    Problem: Disease Progression   Priority: Medium    Long-Range Goal: Disease Progression Prevented or Minimized   Start Date: 11/17/2020  Expected End Date: 05/19/2021  This Visit's Progress: On track  Priority: Medium  Note:   Current Barriers:   Ineffective Self Health Maintenance   Unable to perform ADLs independently  Unable to perform IADLs independently Clinical Goal(s):  [name] Collaboration with [name], MD regarding development and update of comprehensive plan of care as evidenced by provider attestation and co-signature . Inter-disciplinary care team collaboration (see longitudinal plan of care)  patient will work with care management team to address care coordination and chronic disease management needs related to  Disease Management  Educational Needs  Care Coordination  Medication Management and Education  Medication Reconciliation  Psychosocial Support   Interventions:  11/17/20 completed successful outbound call with patient   Evaluation of current treatment plan related to  CKD  , self-management and patient's adherence to plan as established by provider.  Collaboration with [name], MD regarding development and update of comprehensive plan of care as evidenced by provider attestation       and co-signature  Inter-disciplinary care team collaboration (see longitudinal plan of care) . Provided education to patient about basic disease process related to CKD  . Educated on importance of increasing water intake to promote renal function, discussed increasing water to 64 oz per day unless otherwise directed . Review of patient status, including review of consultants reports, relevant laboratory and other test results, and medications completed. . Reviewed medications with patient and discussed importance of medication adherence . Reviewed scheduled/upcoming provider appointments including: next PCP follow up appointment scheduled for  Discussed plans with patient for ongoing care management follow up and provided patient with direct contact information for care management team Self Care Activities:  . Continue to adhere to MD recommendations for CKD  .  Continue to keep all scheduled follow up appointments . Take medications as directed  . Let your healthcare team know if you are unable to take your medications . Call your pharmacy for refills at least 7 days prior to running out of medication . Increase your water intake unless otherwise directed Patient Goals: - maintain adequate kidney function  Follow Up Plan: Telephone follow up appointment with care management team member scheduled for: 02/16/21    Care Plan : Oral Chemotherapy Adherence (Adult)  Updates made by Lynne Logan, RN since 11/24/2020 12:00 AM    Problem: Oral Chemotherapy Adherence   Priority: Medium    Long-Range Goal: Optimal Adherence to Oral Chemotherapy   Start Date: 11/17/2020  Expected End Date: 05/19/2021  This Visit's Progress: On track  Priority: Medium  Note:   Current Barriers:   Ineffective Self Health Maintenance   Unable to perform ADLs independently  Unable to perform IADLs independently Clinical Goal(s):  [name] Collaboration with [name], MD regarding development and update of comprehensive plan of care as evidenced by provider attestation and co-signature . Inter-disciplinary care team collaboration (see longitudinal plan of care)  patient will work with care management team to address care coordination and chronic disease management needs related to Disease Management  Educational Needs  Care Coordination  Medication Management and Education  Psychosocial Support  Caregiver Stress support   Interventions:  11/17/20 completed successful call with patient   Evaluation of current treatment plan related to  non-small cell lung cancer with mets , self-management and patient's adherence to plan as established by provider.  Collaboration with [name], MD regarding development and update of comprehensive plan of care as evidenced by provider attestation       and co-signature  Inter-disciplinary care team collaboration (see longitudinal plan of care)  Determined patient continues to have her Cancer diagnosis managed by Dr. Julien Nordmann with the following Assessment/Plan reviewed:  ASSESSMENT AND PLAN: This is a very pleasant 75 years old African-American female with metastatic non-small cell lung cancer, adenocarcinoma with positive EGFR mutation status post several years of treatment with Tarceva and the patient developed positive resistant mutation, T790M. She has been on treatment with Tagrisso for the last 5 years, status post 62 months of treatment.  She has  been tolerating her treatment with Tagrisso well but she was recently admitted to [facility] with shortness of breath and she was diagnosed with congestive heart failure secondary to cardiomyopathy with ejection fraction of 20-25%.  This is likely secondary to her treatment with Tagrisso. I discontinued her treatment with Tagrisso on 02/13/2020. The patient is currently on treatment with Tarceva 150 mg p.o. daily started 4 months ago with some interruption because of her hospitalization. The patient resumed her treatment with Tarceva at a dose of 100 mg p.o. daily status post 2 months of treatment.  She has been tolerating the reduced dose of treatment with Tarceva fairly well. She had repeat CT scan of the chest, abdomen pelvis performed recently.  I personally and independently reviewed the scans and discussed the results with the patient and her husband. Her scan showed no concerning findings for disease progression. I recommended for the patient to continue her current treatment with Tarceva with the same dose. For the chronic UTI, she is followed by urology and currently on treatment with amoxicillin. The patient was advised to call immediately if she has any other concerning symptoms in the interval. I discussed the assessment and treatment plan with  the patient. The patient was provided an opportunity to ask questions and all were answered. The patient agreed with the plan and demonstrated an understanding of the instructions. The patient was advised to call back or seek an in-person evaluation if the symptoms worsen or if the condition fails to improve as anticipated.   Assessed for tolerance to oral therapy including nausea, vomiting, loss of appetite and altered gastrointestinal function; anticipate referral for uncontrolled symptoms.   Determined patient verbalizes understanding of her Cancer treatment plan and ongoing treatmet/plan of care  Discussed plans with patient for  ongoing care management follow up and provided patient with direct contact information for care management team Self Care Activities:  . - ask for help if I cannot afford the medicine . - call for medicine refill 2 or 3 days before it runs out . - call if I am sick and can not take my medications . - call the doctor or nurse before I stop taking medicine . - call the doctor or nurse to get help with side effects . - keep a list of all the medicines I take; vitamins and herbals too . - keep follow-up appointments . - use a pillbox to sort medicine . - use an alarm clock or phone to remind me to take my medicine Patient Goals: - Cancer will not spread  Follow Up Plan: Telephone follow up appointment with care management team member scheduled for: 02/16/21    Care Plan : Urinary Incontinence (Adult)  Updates made by Lynne Logan, RN since 11/24/2020 12:00 AM    Problem: Symptom Management (Urinary Incontinence)   Priority: Low    Long-Range Goal: Urinary Incontinence Symptoms Manged   Start Date: 11/17/2020  Expected End Date: 05/19/2021  This Visit's Progress: On track  Priority: Low  Note:   Current Barriers:   Ineffective Self Health Maintenance   Unable to perform ADLs independently  Unable to perform IADLs independently Clinical Goal(s):  [name] Collaboration with [name], MD regarding development and update of comprehensive plan of care as evidenced by provider attestation and co-signature . Inter-disciplinary care team collaboration (see longitudinal plan of care)  patient will work with care management team to address care coordination and chronic disease management needs related to Disease Management  Educational Needs  Care Coordination  Medication Management and Education  Psychosocial Support  Caregiver Stress support   Interventions:  11/17/20 completed successful inbound call with patient   Evaluation of current treatment plan related to  chronic UTI's  ,  self-management and patient's adherence to plan as established by provider.  Collaboration with [name], MD regarding development and update of comprehensive plan of care as evidenced by provider attestation       and co-signature  Inter-disciplinary care team collaboration (see longitudinal plan of care) . Provided education to patient about basic disease process related to Impaired Urinary Elimination (Incontinence) . Review of patient status, including review of consultants reports, relevant laboratory and other test results, and medications completed. . Reviewed medications with patient and discussed importance of medication adherence, including the importance of completing full course of prescribed antibiotics . Educated on importance of using good perineal hygiene to help avoid skin breakdown, including wiping from front to back and avoiding bubble baths . Educated on importance of increasing water intake to 64 oz per day unless otherwise directed    Discussed plans with patient for ongoing care management follow up and provided patient with direct contact information for care management  team Self Care Activities:  . Continue to keep all scheduled follow up appointments . Take medications as directed  . Let your healthcare team know if you are unable to take your medications . Call your pharmacy for refills at least 7 days prior to running out of medication . Increase your water intake unless otherwise directed . Track number of times I do not get to the bathroom in time . Track when, how much I go Patient Goals: - have fewer urinary tract infections  - track number of times I do not get to the bathroom in time - track when, how much I go  Follow Up Plan: Telephone follow up appointment with care management team member scheduled for: 02/16/21    Care Plan : Cancer Treatment Phase (Adult)  Updates made by Lynne Logan, RN since 11/24/2020 12:00 AM    Problem: Fatigue    Priority: High    Long-Range Goal: Fatigue Managed   Start Date: 11/17/2020  Expected End Date: 05/19/2021  This Visit's Progress: On track  Priority: High  Note:   Current Barriers:   Ineffective Self Health Maintenance   Unable to perform ADLs independently  Unable to perform IADLs independently Clinical Goal(s):  [name] Collaboration with [name], MD regarding development and update of comprehensive plan of care as evidenced by provider attestation and co-signature . Inter-disciplinary care team collaboration (see longitudinal plan of care)  patient will work with care management team to address care coordination and chronic disease management needs related to Disease Management  Educational Needs  Care Coordination  Medication Management and Education  Psychosocial Support  Caregiver Stress support   Interventions:  11/17/20 successful outbound call completed with patient   Evaluation of current treatment plan related to  chronic fatigue , self-management and patient's adherence to plan as established by provider.  Collaboration with [name], MD regarding development and update of comprehensive plan of care as evidenced by provider attestation       and co-signature  Inter-disciplinary care team collaboration (see longitudinal plan of care) . Provided education to patient about basic disease process related to fatigue . Review of patient status, including review of consultants reports, relevant laboratory and other test results, and medications completed. . Reviewed medications with patient and discussed importance of medication adherence . Educated on importance of using energy conservation techniques in order to help preserve energy . Educated on importance of balancing activity with rest and staying well hydrated while eating a well balanced meal and getting plenty of sleep  Discussed plans with patient for ongoing care management follow up and provided  patient with direct contact information for care management team Self Care Activities:  . - eat healthy . - get a least 8 hours of sleep at night . - get outdoors every day (weather permitting) . - limit daytime naps . - take a warm shower or bath before bed . - use devices that will help like a cane, sock-puller or reacher . - use meditation or relaxation techniques Patient Goals: - manage fatigue  Follow Up Plan: Telephone follow up appointment with care management team member scheduled for: 02/16/21     Plan:Telephone follow up appointment with care management team member scheduled for:  02/16/21  Barb Merino, RN, BSN, CCM Care Management Coordinator [facility]  Direct Phone: [phone]

## 2020-11-29 ENCOUNTER — Other Ambulatory Visit (HOSPITAL_COMMUNITY): Payer: Self-pay

## 2020-11-29 MED FILL — Erlotinib HCl Tab 100 MG (Base Equivalent): ORAL | 30 days supply | Qty: 30 | Fill #0 | Status: AC

## 2020-11-30 ENCOUNTER — Other Ambulatory Visit (HOSPITAL_COMMUNITY): Payer: Self-pay

## 2020-12-01 ENCOUNTER — Other Ambulatory Visit (HOSPITAL_COMMUNITY): Payer: Self-pay

## 2020-12-06 ENCOUNTER — Other Ambulatory Visit: Payer: Self-pay | Admitting: Internal Medicine

## 2020-12-18 IMAGING — CT CT CHEST WITH CONTRAST
2 of 5 series · 13 of 36 positions shown, 16 images · IV contrast (OMNIPAQUE)
Comparison: 04/22/2018, 10/04/2017, 05/25/2015

CLINICAL DATA: Lung cancer staging

EXAM:
CT CHEST, ABDOMEN, AND PELVIS WITH CONTRAST
TECHNIQUE: Multidetector CT imaging of the chest, abdomen and pelvis was
performed following the standard protocol during bolus
administration of intravenous contrast.
CONTRAST:  100mL OMNIPAQUE IOHEXOL 300 MG/ML SOLN additional oral
enteric contrast

[Series 2: cap with · axial · 0.70mm/px · z∈[-752,-212]mm · 10 of 133 slices shown, 13 images]
[im 13/133  mediastinal]
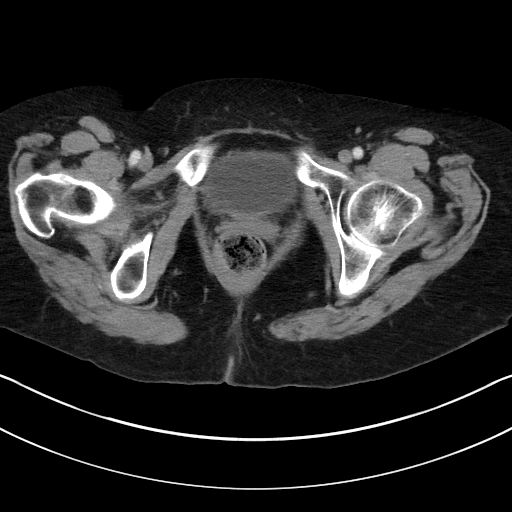
[im 13/133  lung]
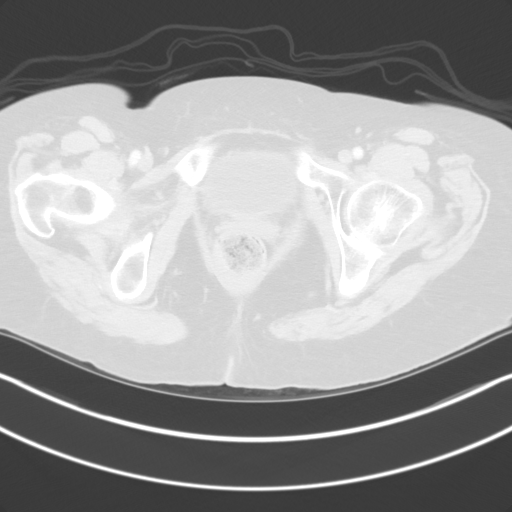
[im 25/133  lung]
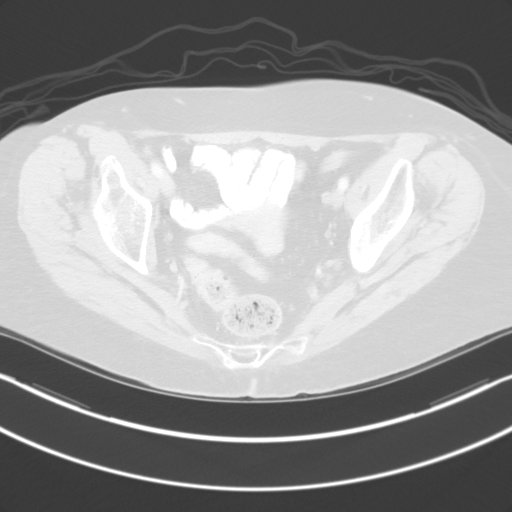
[im 37/133  lung]
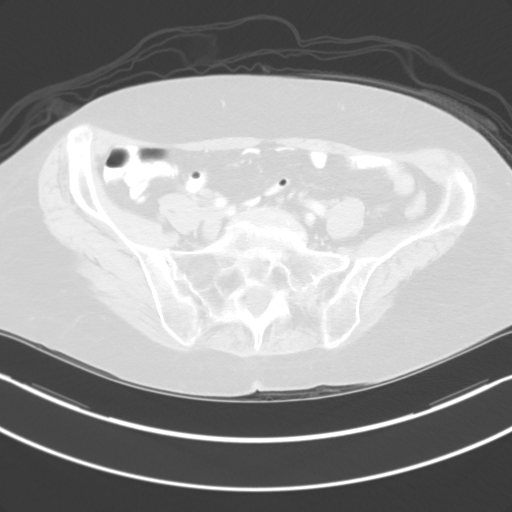
[im 49/133  lung]
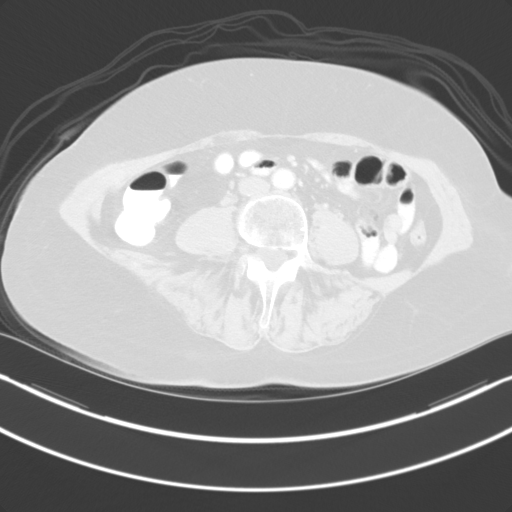
[im 61/133  mediastinal]
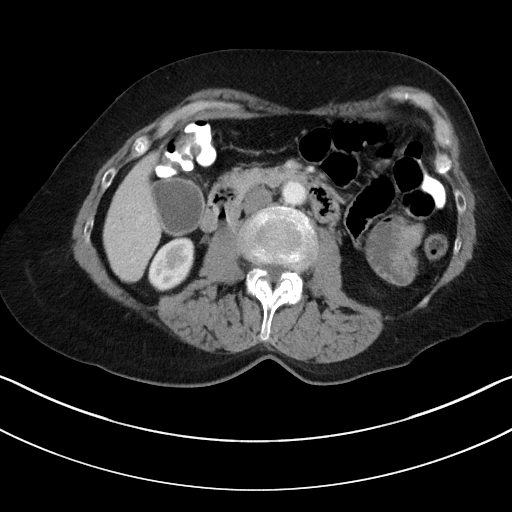
[im 61/133  lung]
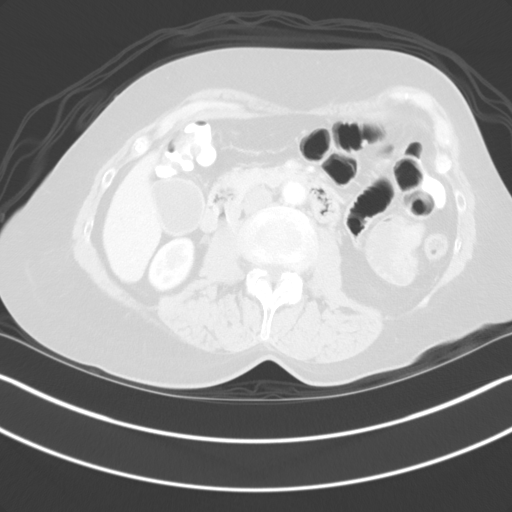
[im 73/133  lung]
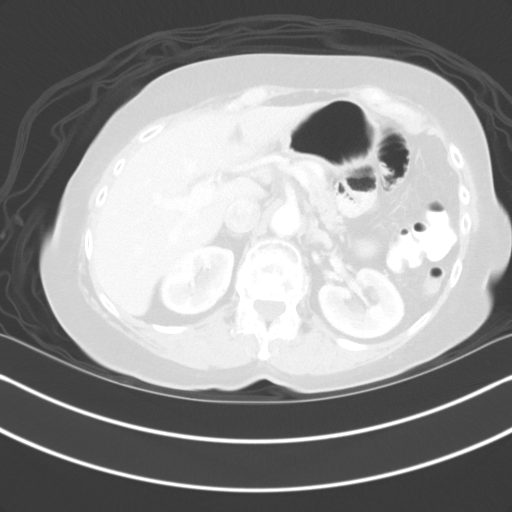
[im 85/133  lung]
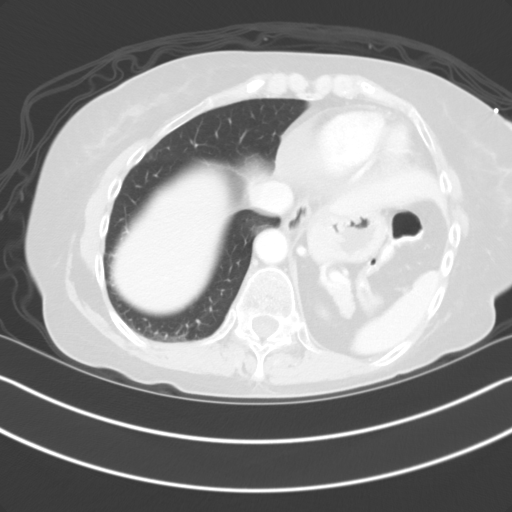
[im 97/133  lung]
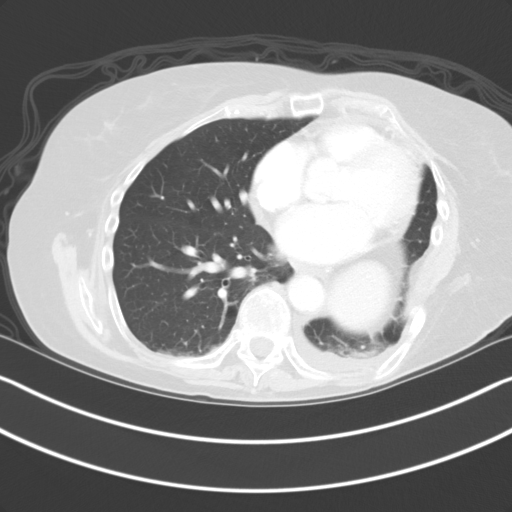
[im 109/133  mediastinal]
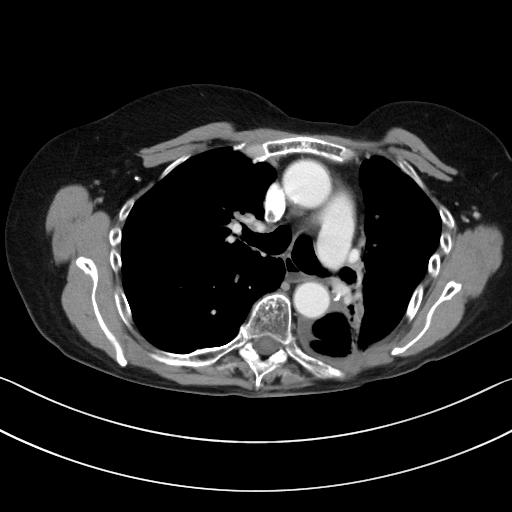
[im 109/133  lung]
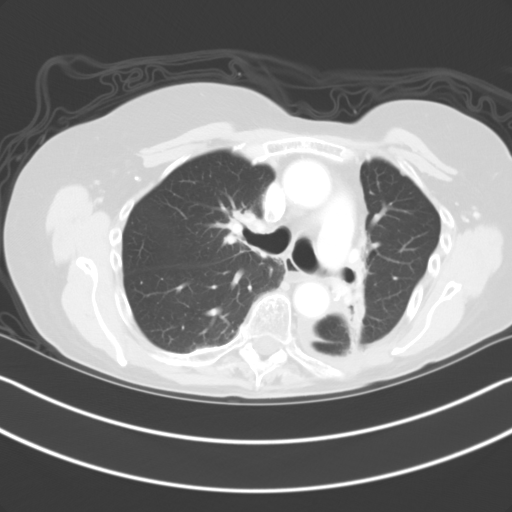
[im 121/133  lung]
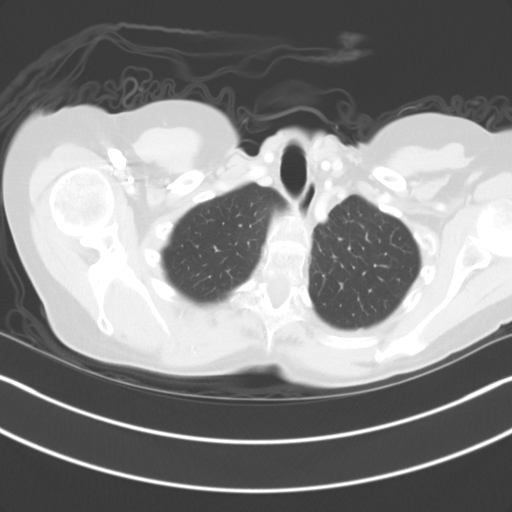

[Series 5: coronals · coronal · 0.77mm/px · 3 of 151 slices shown]
[im 31/151  lung]
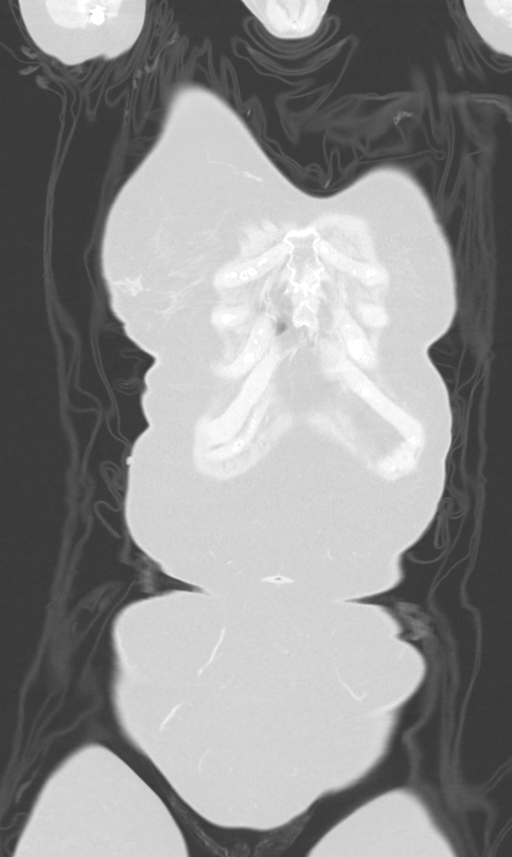
[im 61/151  lung]
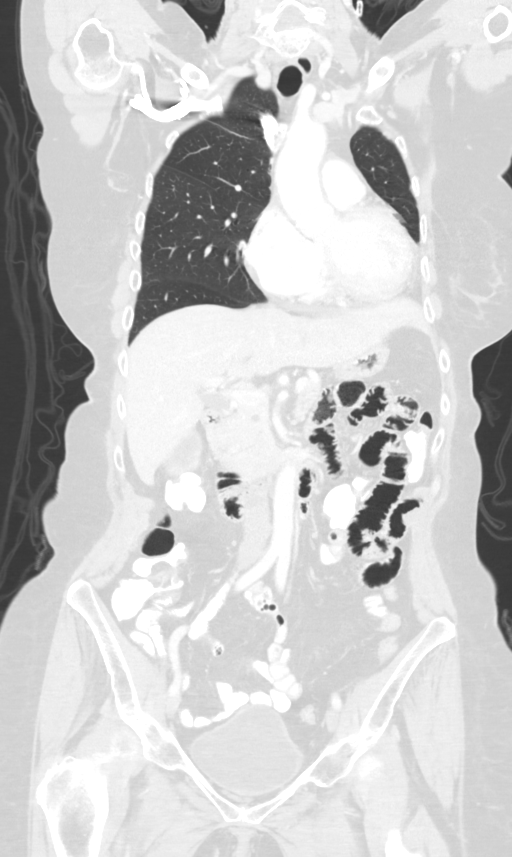
[im 91/151  lung]
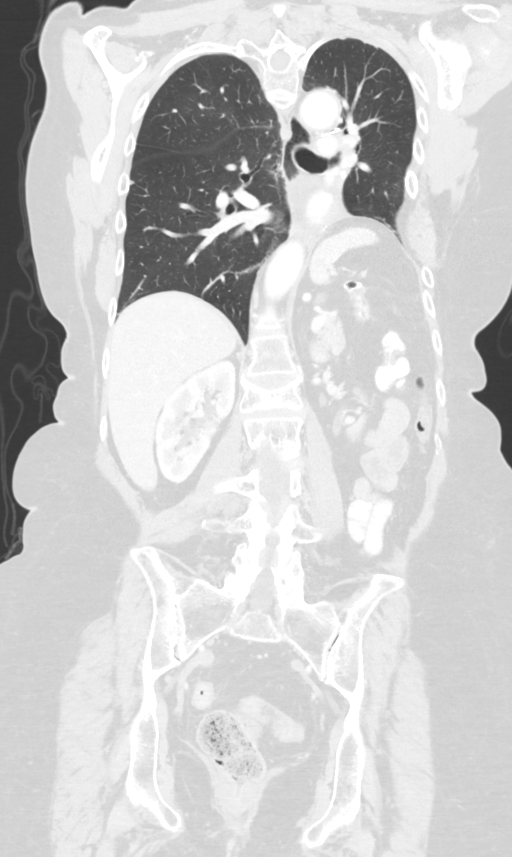

[13 of 36 positions shown; findings below may reference images not displayed]

FINDINGS: CT CHEST FINDINGS

Cardiovascular: Scattered coronary artery calcifications. Normal
heart size. No pericardial effusion.

Mediastinum/Nodes: No enlarged mediastinal, hilar, or axillary lymph
nodes. Status post right lobe thyroidectomy. Trachea, and esophagus
demonstrate no significant findings.

Lungs/Pleura: Redemonstrated postoperative findings of left lower
lobectomy with small left pleural effusion and post treatment
scarring of the left lower lobe. Unchanged spiculated soft tissue of
the suprahilar right upper lobe (series 4, image 52). Stable small
pulmonary nodules bilaterally, for example a 4 mm nodule of the
right upper lobe (series 4, image 53).

Musculoskeletal: No chest wall mass or suspicious bone lesions
identified.

CT ABDOMEN PELVIS FINDINGS

Hepatobiliary: No focal liver abnormality is seen. No gallstones,
gallbladder wall thickening, or biliary dilatation.

Pancreas: Unremarkable. No pancreatic ductal dilatation or
surrounding inflammatory changes.

Spleen: Normal in size without focal abnormality.

Adrenals/Urinary Tract: Adrenal glands are unremarkable. Kidneys are
normal, without renal calculi, focal lesion, or hydronephrosis.
Bladder is unremarkable.

Stomach/Bowel: Stomach is within normal limits. Appendix appears
normal. No evidence of bowel wall thickening, distention, or
inflammatory changes. Occasional sigmoid diverticula.

Vascular/Lymphatic: No significant vascular findings are present. No
enlarged abdominal or pelvic lymph nodes.

Reproductive: Uterine fibroids. Probable fluid in the endometrial
cavity, abnormal in the postmenopausal setting although unchanged in
comparison to multiple prior examinations.

Other: No abdominal wall hernia or abnormality. No abdominopelvic
ascites.

Musculoskeletal: Unchanged vertebral body height loss deformities of
L1 and L4. Osteopenia.
IMPRESSION: 1. Unchanged post treatment appearance of the chest. Redemonstrated
postoperative findings of left lower lobectomy with small left
pleural effusion and post treatment scarring of the left lower lobe.
Unchanged spiculated soft tissue of the suprahilar right upper lobe
(series 4, image 52).

2. Stable small pulmonary nodules bilaterally, likely benign
sequelae of infection or inflammation. Attention on follow-up.

3.  No evidence of metastatic disease in the abdomen or pelvis.

4.  Chronic and incidental findings as detailed above.

## 2020-12-18 IMAGING — CT CT NECK WITH CONTRAST
4 series · 14 of 33 positions shown, 17 images · IV contrast (OMNIPAQUE)
Comparison: CT neck 10/04/2017.

CLINICAL DATA: Metastatic lung cancer, neck pain, fatigue, and
diarrhea.

EXAM:
CT NECK WITH CONTRAST
TECHNIQUE: Multidetector CT imaging of the neck was performed using the
standard protocol following the bolus administration of intravenous
contrast.
CONTRAST:  100mL OMNIPAQUE IOHEXOL 300 MG/ML  SOLN

[Series 2: axial neck · axial · 0.58mm/px · z∈[-217,-59]mm · 5 of 119 slices shown, 7 images]
[im 20/119  soft-tissue]
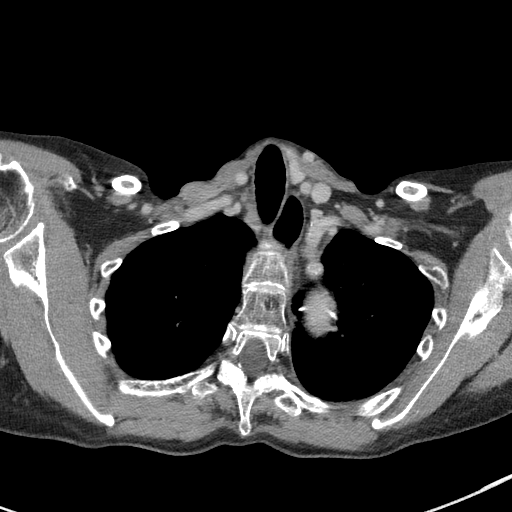
[im 20/119  bone]
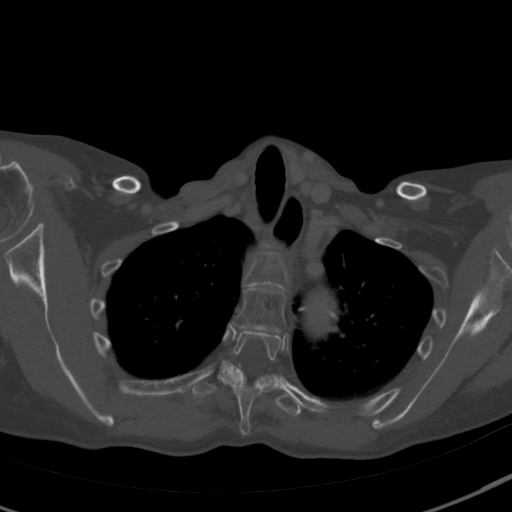
[im 40/119  bone]
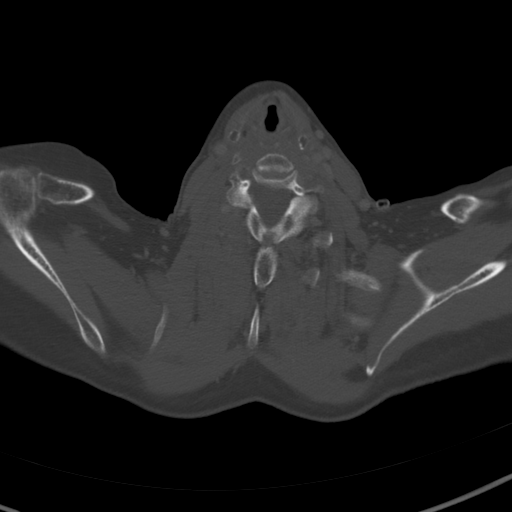
[im 60/119  bone]
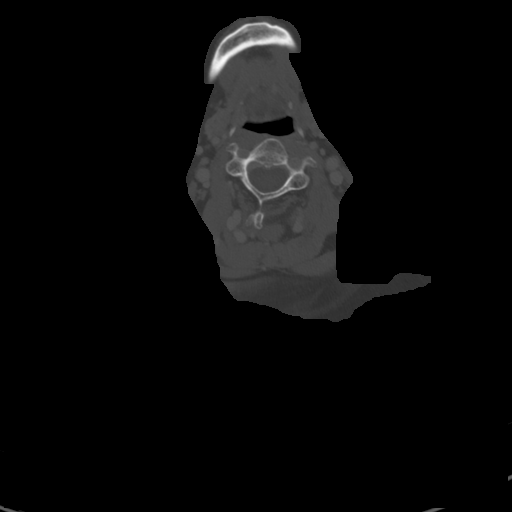
[im 79/119  bone]
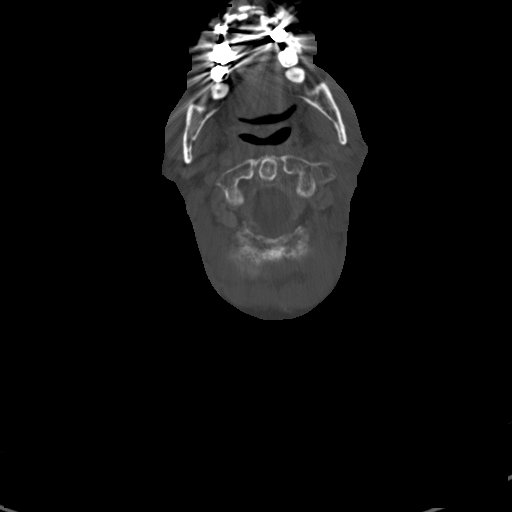
[im 99/119  soft-tissue]
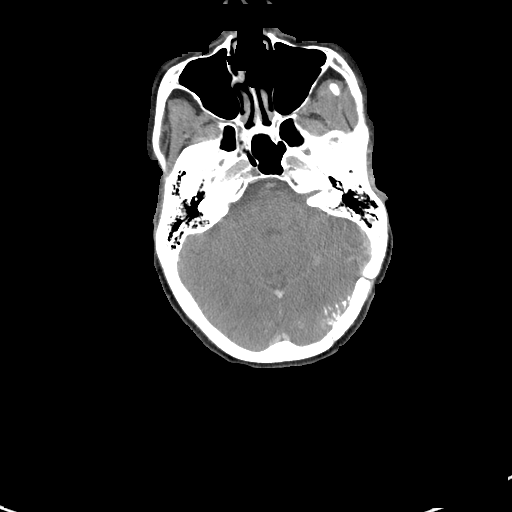
[im 99/119  bone]
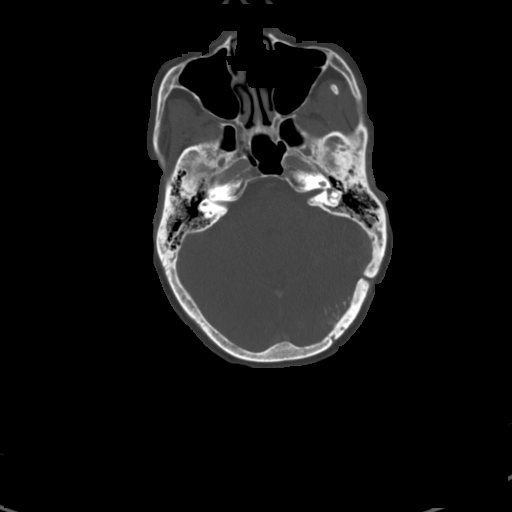

[Series 5: orthogonal ax · axial · 0.39mm/px · 1 of 119 slices shown]
[im 20/119  bone]
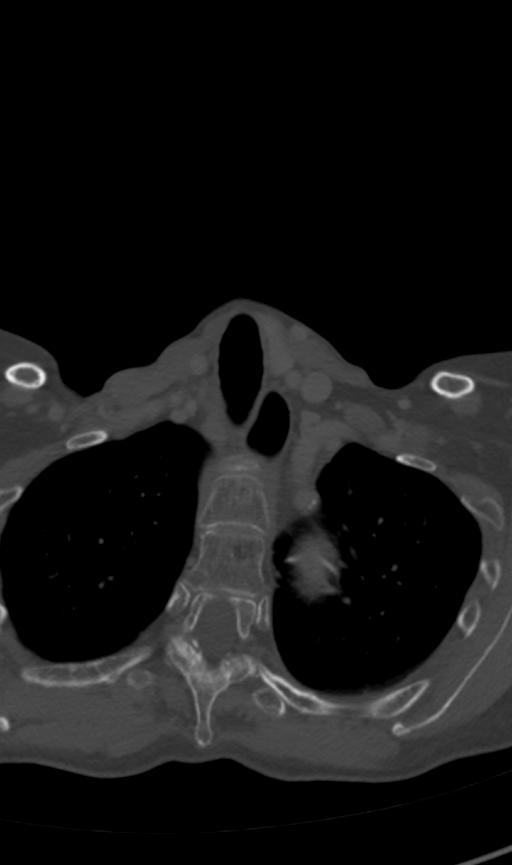

[Series 6: cor neck · coronal · 0.46mm/px · 3 of 131 slices shown]
[im 27/131  bone]
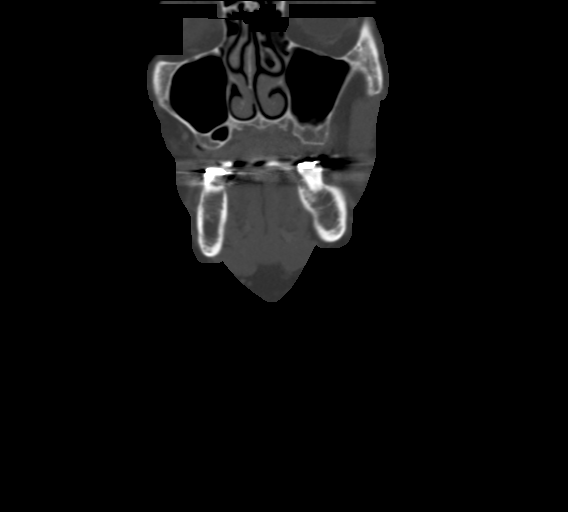
[im 53/131  bone]
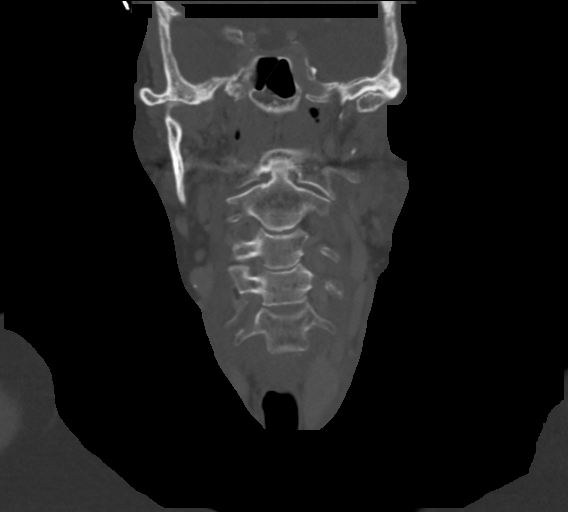
[im 79/131  bone]
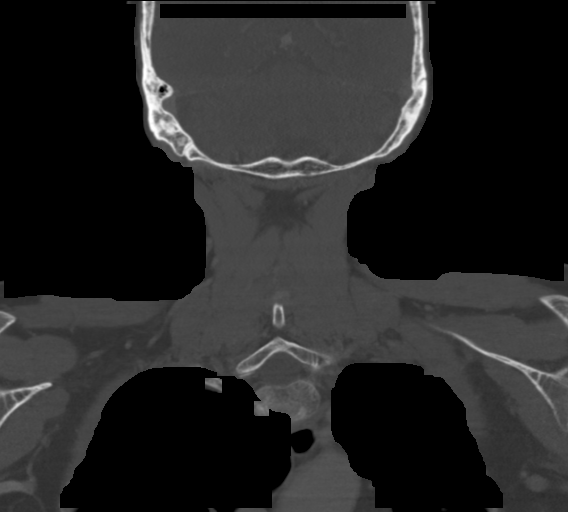

[Series 7: sag neck · sagittal · 0.47mm/px · 5 of 109 slices shown, 6 images]
[im 37/109  bone]
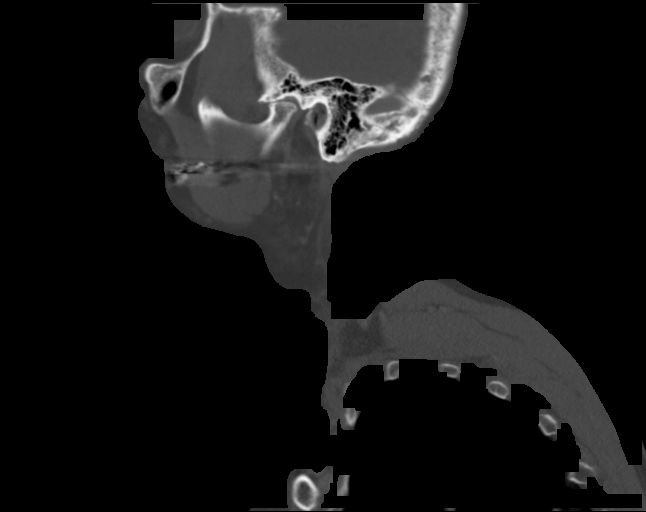
[im 46/109  bone]
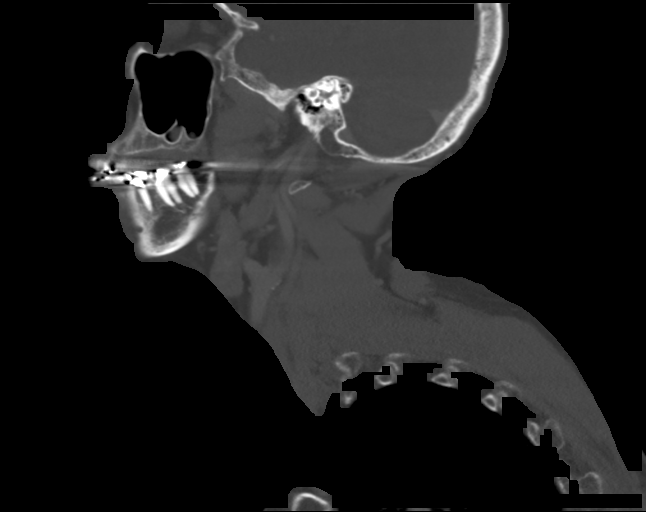
[im 55/109  soft-tissue]
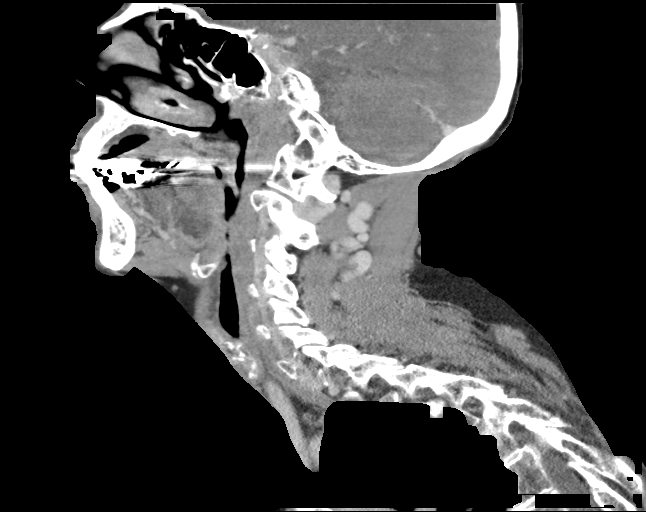
[im 55/109  bone]
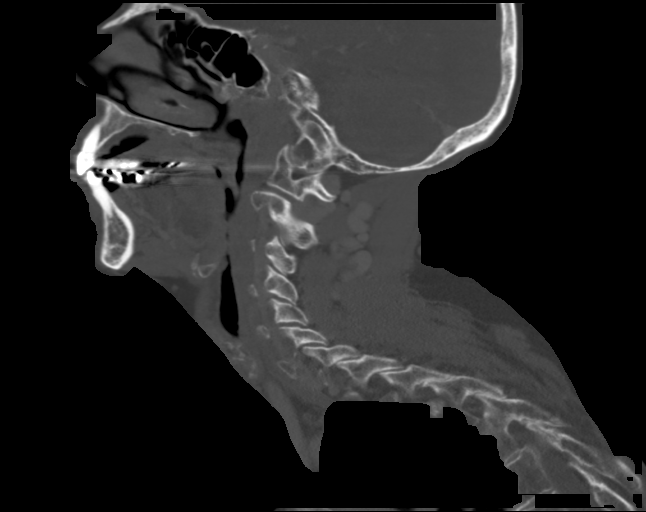
[im 64/109  bone]
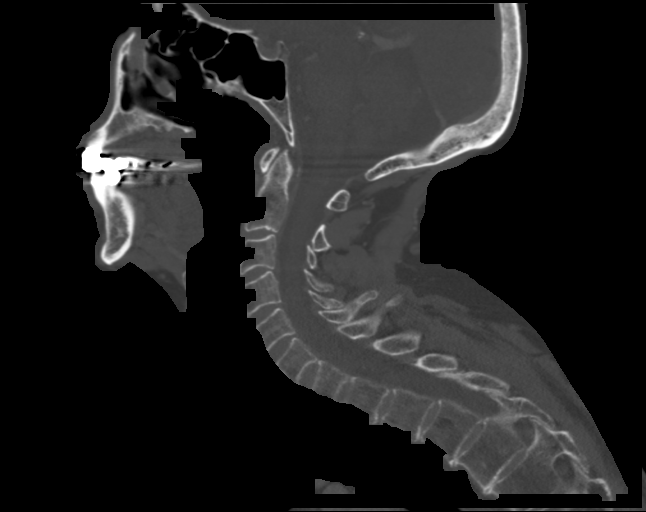
[im 73/109  bone]
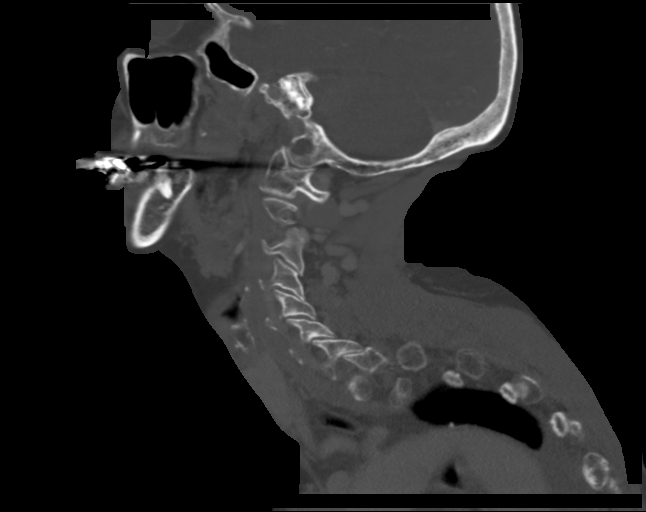

[14 of 33 positions shown; findings below may reference images not displayed]

FINDINGS: Pharynx and larynx: Normal. No mass or swelling.

Salivary glands: No inflammation, mass, or stone.

Thyroid: RIGHT hemithyroidectomy.

Lymph nodes: None enlarged or abnormal density.

Vascular: Negative.

Limited intracranial: Negative.

Visualized orbits: Negative.

Mastoids and visualized paranasal sinuses: No fluid.

Skeleton: Scoliotic deformity. No aggressive lesions.

Upper chest: Reported separately.

Other: None.
IMPRESSION: No evidence of metastatic disease in the neck. Similar favorable
appearance when compared with previous exam from [DATE].

## 2020-12-20 DIAGNOSIS — R54 Age-related physical debility: Secondary | ICD-10-CM | POA: Diagnosis not present

## 2020-12-26 ENCOUNTER — Other Ambulatory Visit (HOSPITAL_COMMUNITY): Payer: Self-pay

## 2021-01-20 DIAGNOSIS — R54 Age-related physical debility: Secondary | ICD-10-CM | POA: Diagnosis not present

## 2021-01-22 IMAGING — MR MRI HEAD WITHOUT AND WITH CONTRAST
7 of 13 series · 32 of 48 positions shown · IV contrast (gadavist)
Comparison: Head CT 09/13/2018 and MRI 10/01/2017

CLINICAL DATA: New onset visual changes with hearing loss,
weakness, and difficulty walking. Recent fall, hitting the back of
the head. History of lung cancer and craniotomy for metastasis
resection.

EXAM:
MRI HEAD WITHOUT AND WITH CONTRAST
TECHNIQUE: Multiplanar, multiecho pulse sequences of the brain and surrounding
structures were obtained without and with intravenous contrast.
CONTRAST:  7 mL Gadavist

[Series 3: DWI · axial · 3.0mm · 1.09mm/px · z∈[-19,+115]mm · 7 of 96 slices shown (1 of 4)]
[im 1/96]
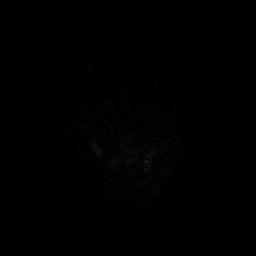
[im 16/96]
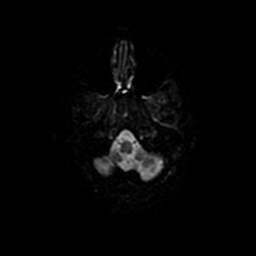
[im 32/96]
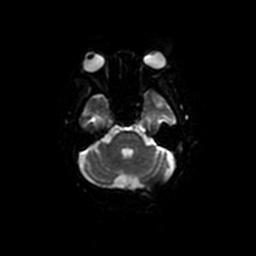
[im 48/96]
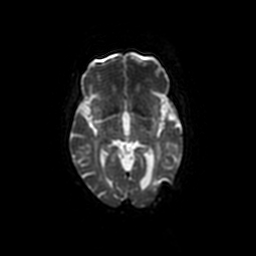
[im 64/96]
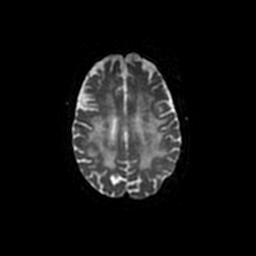
[im 80/96]
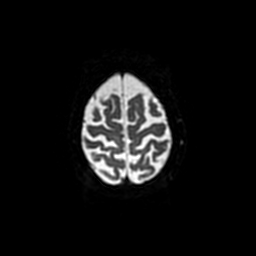
[im 96/96]
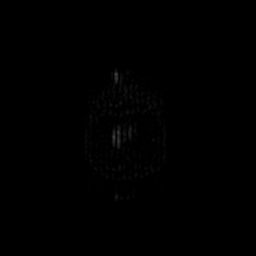

[Series 8: FLAIR · axial · 3.0mm · 0.43mm/px · z∈[-35,+111]mm · 2 of 26 slices shown]
[im 1/26]
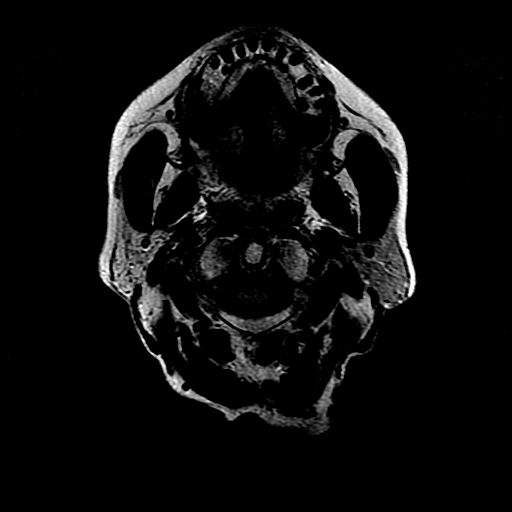
[im 26/26]
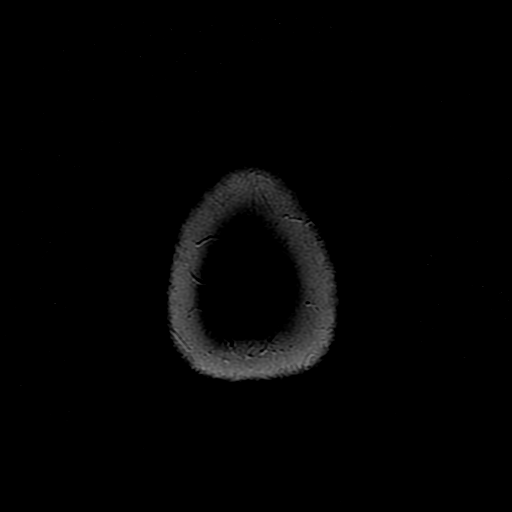

[Series 11: DWI · coronal · 4.0mm · 1.09mm/px · 6 of 80 slices shown (2 of 4)]
[im 1/80]
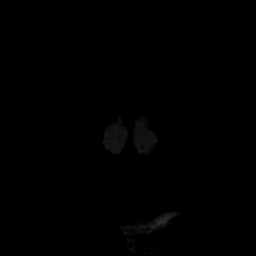
[im 16/80]
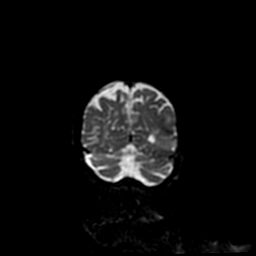
[im 32/80]
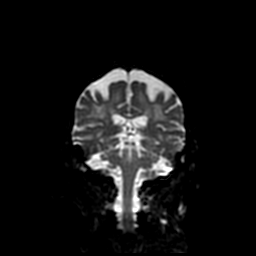
[im 48/80]
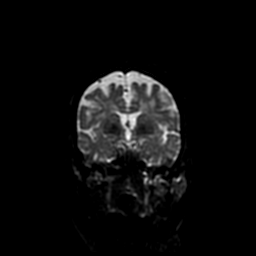
[im 64/80]
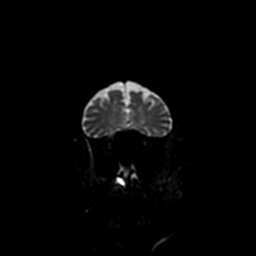
[im 80/80]
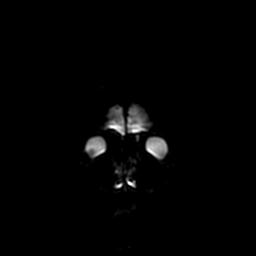

[Series 13: T1 post-contrast · axial · 1.4mm · 0.47mm/px · z∈[-34,+110]mm · 8 of 106 slices shown (1 of 2)]
[im 1/106]
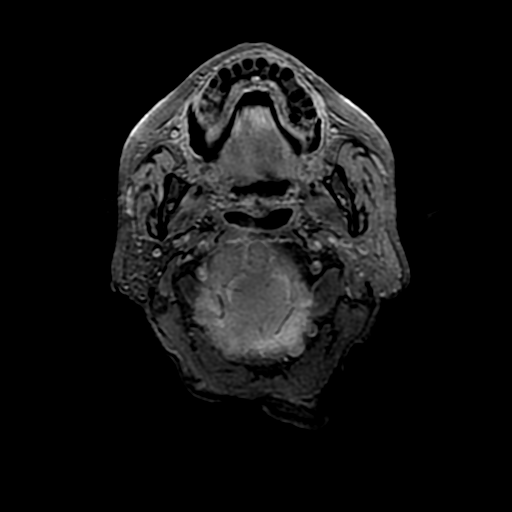
[im 16/106]
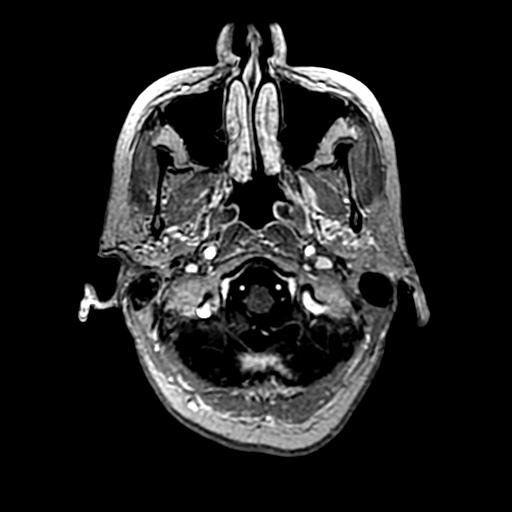
[im 31/106]
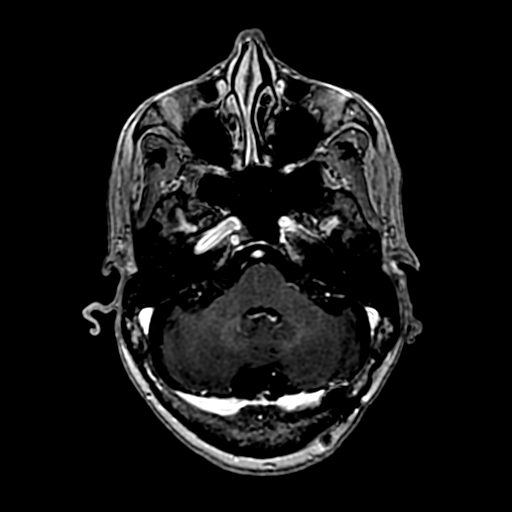
[im 46/106]
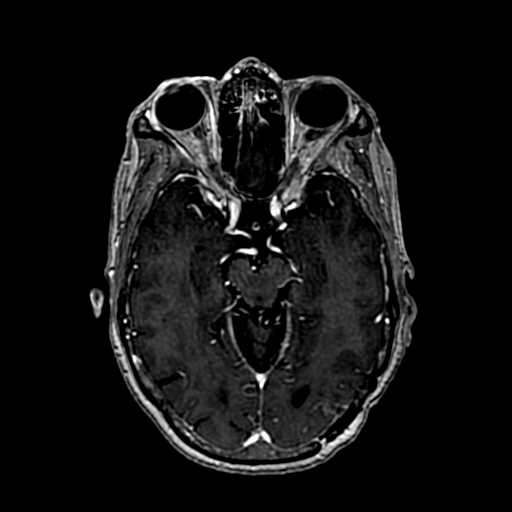
[im 61/106]
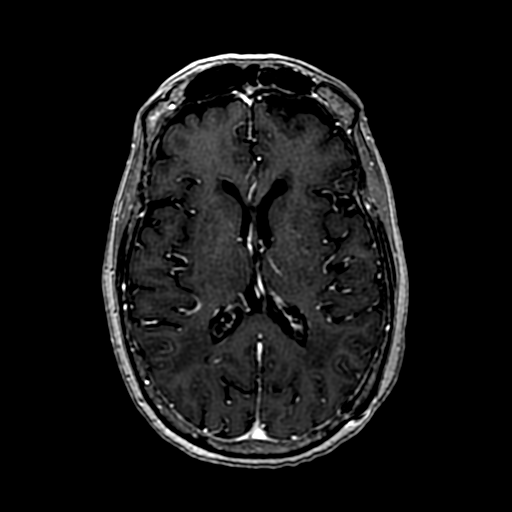
[im 76/106]
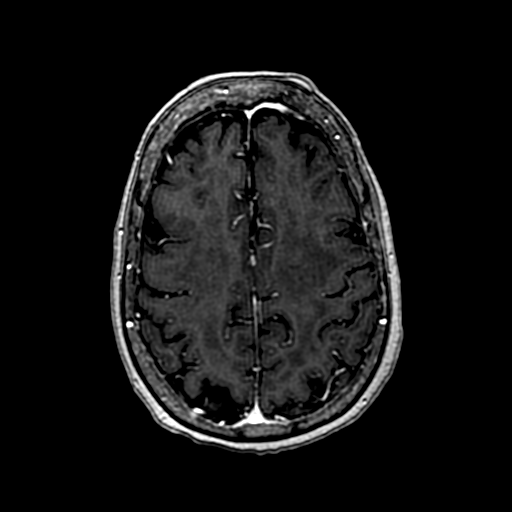
[im 91/106]
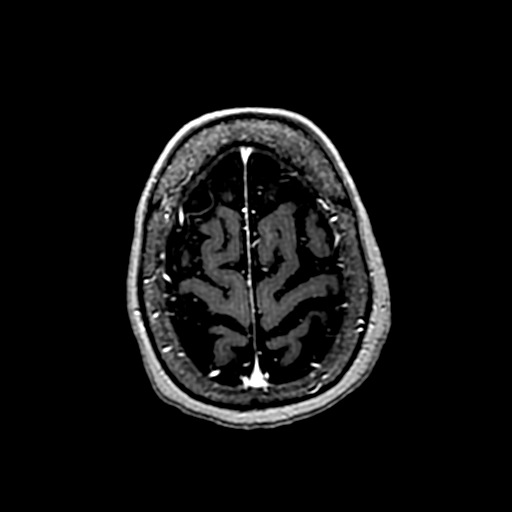
[im 106/106]
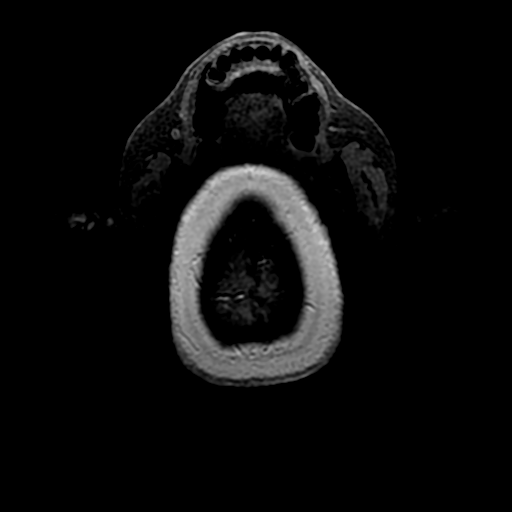

[Series 14: T1 post-contrast · coronal · 5.0mm · 0.47mm/px · 2 of 24 slices shown (2 of 2)]
[im 1/24]
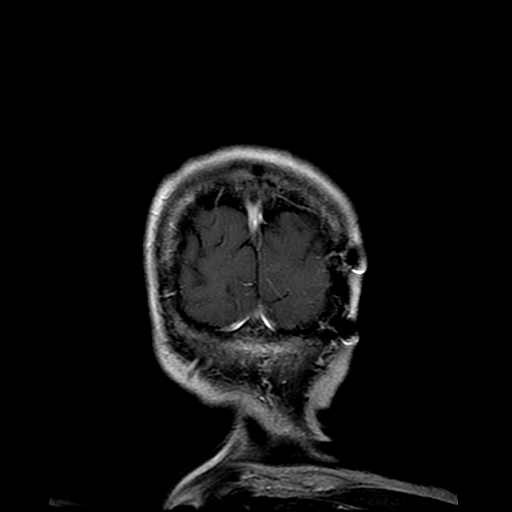
[im 24/24]
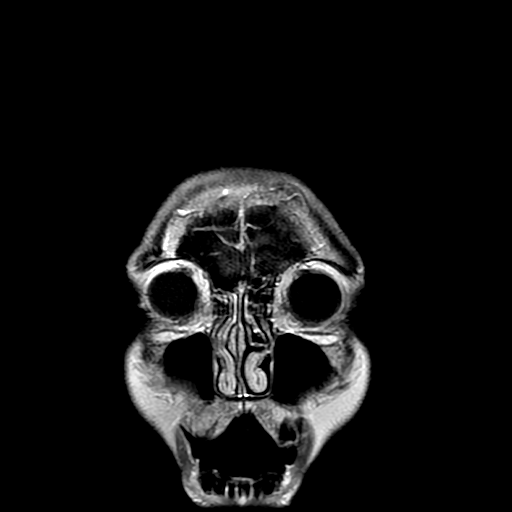

[Series 300: DWI · axial · 3.0mm · 1.09mm/px · z∈[-19,+115]mm · 4 of 47 slices shown (3 of 4)]
[im 1/47]
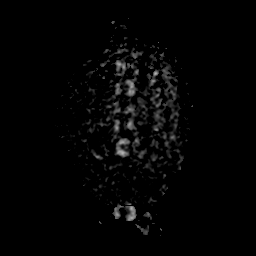
[im 16/47]
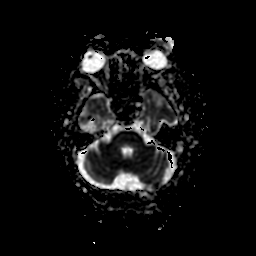
[im 31/47]
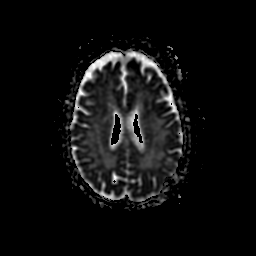
[im 47/47]
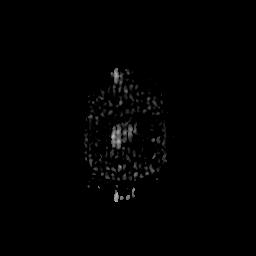

[Series 1100: DWI · coronal · 4.0mm · 1.09mm/px · 3 of 40 slices shown (4 of 4)]
[im 1/40]
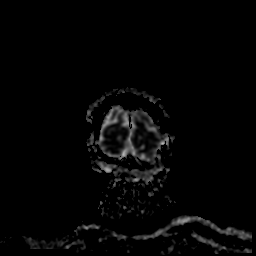
[im 20/40]
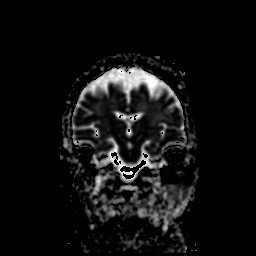
[im 40/40]
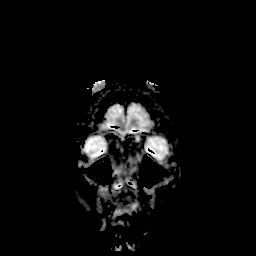

[32 of 48 positions shown; findings below may reference images not displayed]

FINDINGS: Brain: There is no evidence of acute infarct, mass, midline shift,
or extra-axial fluid collection. There is mild to moderate cerebral
atrophy. A chronic microhemorrhage in the right cerebellar
hemisphere is unchanged the prior MRI. A few scattered chronic
microhemorrhages are noted in both cerebral hemispheres, slightly
increased in number in the interval.

Left occipital lobe susceptibility artifact subjacent to the
craniotomy corresponds to calcification on CT. Small volume, mildly
nodular enhancement at the left occipital lobe resection site is
unchanged. No enhancing intracranial lesions are seen elsewhere.
Extensive patchy and confluent T2 hyperintensity in the cerebral
white matter bilaterally is unchanged and may represent a
combination of post treatment changes and chronic small vessel
ischemia.

Vascular: Major intracranial vascular flow voids are preserved.

Skull and upper cervical spine: Left occipital craniotomy. No
suspicious marrow lesion.

Sinuses/Orbits: Unremarkable orbits. Small right mastoid effusion.
Clear paranasal sinuses.

Other: None.
IMPRESSION: 1. No evidence of recurrent intracranial metastatic disease or acute
abnormality.
2. Stable postoperative changes in the left occipital lobe.

## 2021-01-23 ENCOUNTER — Other Ambulatory Visit: Payer: Self-pay

## 2021-01-23 DIAGNOSIS — Z86718 Personal history of other venous thrombosis and embolism: Secondary | ICD-10-CM

## 2021-01-23 MED ORDER — XARELTO 20 MG PO TABS: 20.0000 mg | ORAL_TABLET | Freq: Every evening | ORAL | 0 refills | Status: DC

## 2021-01-30 ENCOUNTER — Other Ambulatory Visit (HOSPITAL_COMMUNITY): Payer: Self-pay

## 2021-01-31 ENCOUNTER — Telehealth: Payer: Self-pay | Admitting: Internal Medicine

## 2021-02-14 ENCOUNTER — Inpatient Hospital Stay: Payer: Medicare PPO | Attending: Internal Medicine

## 2021-02-14 ENCOUNTER — Inpatient Hospital Stay (HOSPITAL_BASED_OUTPATIENT_CLINIC_OR_DEPARTMENT_OTHER): Payer: Medicare PPO | Admitting: Internal Medicine

## 2021-02-14 ENCOUNTER — Other Ambulatory Visit: Payer: Self-pay

## 2021-02-14 ENCOUNTER — Other Ambulatory Visit (HOSPITAL_COMMUNITY): Payer: Self-pay

## 2021-02-14 VITALS — BP 123/71 | HR 100 | Temp 97.5°F | Resp 18 | Ht 68.0 in

## 2021-02-14 DIAGNOSIS — F0390 Unspecified dementia without behavioral disturbance: Secondary | ICD-10-CM | POA: Insufficient documentation

## 2021-02-14 DIAGNOSIS — C770 Secondary and unspecified malignant neoplasm of lymph nodes of head, face and neck: Secondary | ICD-10-CM | POA: Insufficient documentation

## 2021-02-14 DIAGNOSIS — C3411 Malignant neoplasm of upper lobe, right bronchus or lung: Secondary | ICD-10-CM

## 2021-02-14 DIAGNOSIS — Z85118 Personal history of other malignant neoplasm of bronchus and lung: Secondary | ICD-10-CM | POA: Diagnosis not present

## 2021-02-14 DIAGNOSIS — D638 Anemia in other chronic diseases classified elsewhere: Secondary | ICD-10-CM | POA: Diagnosis not present

## 2021-02-14 DIAGNOSIS — Z85850 Personal history of malignant neoplasm of thyroid: Secondary | ICD-10-CM | POA: Insufficient documentation

## 2021-02-14 DIAGNOSIS — M542 Cervicalgia: Secondary | ICD-10-CM | POA: Insufficient documentation

## 2021-02-14 DIAGNOSIS — M2550 Pain in unspecified joint: Secondary | ICD-10-CM | POA: Insufficient documentation

## 2021-02-14 DIAGNOSIS — Z9221 Personal history of antineoplastic chemotherapy: Secondary | ICD-10-CM | POA: Insufficient documentation

## 2021-02-14 DIAGNOSIS — Z79899 Other long term (current) drug therapy: Secondary | ICD-10-CM | POA: Diagnosis not present

## 2021-02-14 DIAGNOSIS — C3432 Malignant neoplasm of lower lobe, left bronchus or lung: Secondary | ICD-10-CM | POA: Diagnosis not present

## 2021-02-14 DIAGNOSIS — C3490 Malignant neoplasm of unspecified part of unspecified bronchus or lung: Secondary | ICD-10-CM | POA: Diagnosis not present

## 2021-02-14 DIAGNOSIS — Z923 Personal history of irradiation: Secondary | ICD-10-CM | POA: Diagnosis not present

## 2021-02-14 DIAGNOSIS — Z7901 Long term (current) use of anticoagulants: Secondary | ICD-10-CM | POA: Insufficient documentation

## 2021-02-14 DIAGNOSIS — R5383 Other fatigue: Secondary | ICD-10-CM | POA: Insufficient documentation

## 2021-02-14 LAB — CBC WITH DIFFERENTIAL (CANCER CENTER ONLY)
Abs Immature Granulocytes: 0.01 10*3/uL (ref 0.00–0.07)
Basophils Absolute: 0 10*3/uL (ref 0.0–0.1)
Basophils Relative: 1 %
Eosinophils Absolute: 0.1 10*3/uL (ref 0.0–0.5)
Eosinophils Relative: 2 %
HCT: 39.3 % (ref 36.0–46.0)
Hemoglobin: 12.4 g/dL (ref 12.0–15.0)
Immature Granulocytes: 0 %
Lymphocytes Relative: 17 %
Lymphs Abs: 1.2 10*3/uL (ref 0.7–4.0)
MCH: 28.9 pg (ref 26.0–34.0)
MCHC: 31.6 g/dL (ref 30.0–36.0)
MCV: 91.6 fL (ref 80.0–100.0)
Monocytes Absolute: 0.5 10*3/uL (ref 0.1–1.0)
Monocytes Relative: 7 %
Neutro Abs: 4.9 10*3/uL (ref 1.7–7.7)
Neutrophils Relative %: 73 %
Platelet Count: 278 10*3/uL (ref 150–400)
RBC: 4.29 MIL/uL (ref 3.87–5.11)
RDW: 13.3 % (ref 11.5–15.5)
WBC Count: 6.7 10*3/uL (ref 4.0–10.5)
nRBC: 0 % (ref 0.0–0.2)

## 2021-02-14 LAB — CMP (CANCER CENTER ONLY)
ALT: 7 U/L (ref 0–44)
AST: 8 U/L — ABNORMAL LOW (ref 15–41)
Albumin: 3.5 g/dL (ref 3.5–5.0)
Alkaline Phosphatase: 105 U/L (ref 38–126)
Anion gap: 11 (ref 5–15)
BUN: 20 mg/dL (ref 8–23)
CO2: 24 mmol/L (ref 22–32)
Calcium: 9.7 mg/dL (ref 8.9–10.3)
Chloride: 107 mmol/L (ref 98–111)
Creatinine: 0.83 mg/dL (ref 0.44–1.00)
GFR, Estimated: >60 mL/min (ref >60)
Glucose, Bld: 115 mg/dL — ABNORMAL HIGH (ref 70–99)
Potassium: 3.9 mmol/L (ref 3.5–5.1)
Sodium: 142 mmol/L (ref 135–145)
Total Bilirubin: 0.4 mg/dL (ref 0.3–1.2)
Total Protein: 7.2 g/dL (ref 6.5–8.1)

## 2021-02-14 MED ORDER — ERLOTINIB HCL 100 MG PO TABS
ORAL_TABLET | ORAL | 2 refills | Status: DC
  Filled 2021-02-14: qty 30, fill #0
  Filled 2021-02-17: qty 30, 30d supply, fill #0
  Filled 2021-03-17: qty 30, 30d supply, fill #1
  Filled 2021-04-25: qty 30, 30d supply, fill #2

## 2021-02-14 NOTE — Progress Notes (Signed)
[facility] [phone]   [phone]  OFFICE PROGRESS NOTE  [name], [address] [place] [address]  PRINCIPAL DIAGNOSIS:  metastatic non-small cell lung cancer, adenocarcinoma with brain metastases initially diagnosed as a stage IIb in October 2002.   BIOMARKERS TESTING ([facility]): Positive for: EGFR [id number] del, T790M, CTNNB1 D32G, SMAD4 T162f*10 Negative for: RET, ALK, BRAF, KRAS, ERBB2 and MET   PRIOR THERAPY:  Status post left lower lobectomy under the care of Dr BArlyce Dicein October 2002. Status post course of concurrent chemoradiation with weekly carboplatin and paclitaxel under the care of Dr SBenay Spiceand Dr WElba Barmanin early 2003. Status post treatment [facility]brex [facility]. Status post left occipital craniotomy for tumor resection on September 21, 2005, under the care of Dr CChristella Noa Status post whole brain irradiation under the care of Dr WElba Barman completed November 07, 2005. Status post gamma knife [facility] the [facility]cted [facility] Status post right thyroidectomy with radical neck dissection under the care of Dr. RConstance Holsteron 10/18/2011. Status post excisional biopsy of right cervical lymph node that was consistent with metastatic adenocarcinoma. Tarceva 150 mg by mouth daily with therapy beginning 06/03/2007, discontinued in July 2015 secondary to disease progression. Status post 79 cycles. palliative radiotherapy to the enlarging right upper lobe lung nodule under the care of Dr. MValere Dross [id number] 300 mg by mouth daily for 21 days discontinued secondary to study deviation and [facility]. Open reduction and internal fixation of trimalleolar ankle  fracture dislocation with fixation of the fibula as well as medial malleolus. Tagrisso 80 mg by mouth daily started 11/25/2014, status post 62 months of treatment.  This was discontinued on 02/13/2020 secondary to congestive heart failure with ejection fraction of 20-25%.     CURRENT THERAPY: Tarceva 150 mg p.o. daily.  Started 02/26/2020.  This will be reduced to Tarceva 100 mg p.o. daily starting July 01, 2020.  INTERVAL HISTORY: Valerie OHAVER75y.o. female returns to the clinic today for follow-up visit accompanied by her husband.  The patient continues to have generalized fatigue and weakness.  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  She will see her dentist for evaluation.  She denied having any current chest pain, shortness of breath, cough or hemoptysis.  She denied having any fever or chills.  She has no nausea, vomiting, diarrhea or constipation.  She stopped her treatment with Tarceva in May 2022 on her own.  She did not have a lot of side effects from the reduced dose of Tarceva 100 mg p.o. daily.  She was last seen in January 2022 and she was supposed to have follow-up visit in 2 months but she missed her appointment.  The patient is here today for evaluation and repeat blood work.  MEDICAL HISTORY: Past Medical History:  Diagnosis Date   Abnormal Pap smear 2006   Anal fistula    Ankle fracture    Anxiety    Arthritis    Atrophic vaginitis 2008   Cataract    Dementia ([place] 2009   Dyspareunia 2008   H/O osteoporosis    H/O varicella    H/O vitamin D deficiency  Headache 07/24/2016   Heart murmur    History of measles, mumps, or rubella    History of radiation therapy 07/28/13- 08/10/13   right lung metastasis 5000 cGy 10 sessions   Hypertension    Lung cancer ([place]) dx'd 2002   Lung cancer ([place])    Lung cancer ([place])    Lung metastasis ([place])    PET scan 05/05/13, RUL lung nodule   Metastasis to brain [facility]) dx'd 2008   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   Nodule of right lung CT- 06/03/12   RIGHT UPPER LOBE   Nodule of right lung 06/03/12   Upper Lobe   On antineoplastic chemotherapy    TARCEVA   Osteoporosis 2010   Primary cancer of right upper lobe of lung ([place]) 04/22/2009   Qualifier: Diagnosis of  By: Nils Pyle CMA (AAMA), Leisha     Shortness of breath    hx lung ca    Status post chemotherapy 2003   CARBOPLATIN/PACLITAXEL /STATUS POST CLINICAL TRAIL OF CELEBREX AND IRESSA AT [facility] FOR 1 YEAR   Status post radiation therapy 2003   LEFT LUNG   Status post radiation therapy 11/07/2005   WHOLE BRAIN: DR Larkin Ina [facility]   Status post radiation therapy 06/02/2008   GAMMA KNIFE OF RESECTED CAVITAY   Thyroid adenoma    ?   Thyroid cancer ([place]) 10/18/11 bx   adenoid nodules    Yeast infection     ALLERGIES:  has No Known Allergies.  MEDICATIONS:  Current Outpatient Medications  Medication Sig Dispense Refill   Ascorbic Acid (VITAMIN C) 100 MG tablet Take 100 mg by mouth every evening. Gummie     Cholecalciferol (D3 PO) Take 1 capsule by mouth daily.     diphenhydrAMINE (BENADRYL) 25 MG tablet Take 25 mg by mouth every 6 (six) hours as needed for allergies.     donepezil (ARICEPT) 10 MG tablet TAKE 1 TABLET BY MOUTH EVERY EVENING 90 tablet 0   erlotinib (TARCEVA) 100 MG tablet TAKE 1 TABLET (100 MG TOTAL) BY MOUTH DAILY. TAKE ON AN EMPTY STOMACH 1 HOUR BEFORE MEALS OR 2 HOURS AFTER 30 tablet 2   hydrocerin (EUCERIN) CREA Apply 1 application topically 2 (two) times daily. To back and buttocks. 113 g 0   loperamide (IMODIUM A-D) 2 MG tablet Take 4 mg by mouth in the morning and at bedtime.     MAGNESIUM PO Take 1 tablet by mouth daily.     metoprolol succinate (TOPROL-XL) 25 MG 24 hr tablet Take 1 tablet (25 mg total) by mouth daily. 90 tablet 3   Multiple Vitamin (MULTI-VITAMINS) TABS Take 1 tablet by mouth every evening.      potassium chloride SA (KLOR-CON) 20 MEQ tablet Take 20 mEq by mouth daily.     prochlorperazine  (COMPAZINE) 10 MG tablet Take 1 tablet (10 mg total) by mouth every 6 (six) hours as needed for nausea or vomiting. 30 tablet 0   vitamin B-12 (CYANOCOBALAMIN) 1000 MCG tablet Take 1,000 mcg by mouth every evening.     XARELTO 20 MG TABS tablet Take 1 tablet (20 mg total) by mouth every evening. 90 tablet 0   No current facility-administered medications for this visit.    SURGICAL HISTORY:  Past Surgical History:  Procedure Laterality Date   BRAIN SURGERY     INTRAVASCULAR ULTRASOUND/IVUS N/A 02/16/2020   Procedure: Intravascular Ultrasound/IVUS;  Surgeon: Leonie Man, MD;  Location: [facility];  Service: Cardiovascular;  Laterality: N/A;   LEFT LOWER LOBECTOMY  05/2001   LEFT OCCIPITAL CRANIOTOMY  09/21/2005   tumor   LUNG LOBECTOMY     NECK SURGERY     ORIF ANKLE FRACTURE Left 10/02/2014   Procedure: OPEN REDUCTION INTERNAL FIXATION (ORIF) ANKLE FRACTURE;  Surgeon: Alta Corning, MD;  Location: [facility];  Service: Orthopedics;  Laterality: Left;   ORIF FEMUR FRACTURE Right 06/11/2019   Procedure: OPEN REDUCTION INTERNAL FIXATION (ORIF) DISTAL FEMUR FRACTURE;  Surgeon: Renette Butters, MD;  Location: [facility];  Service: Orthopedics;  Laterality: Right;   ORIF TIBIA PLATEAU Right 06/11/2019   Procedure: Open Reduction Internal Fixation (Orif) Tibial Plateau;  Surgeon: Renette Butters, MD;  Location: [facility];  Service: Orthopedics;  Laterality: Right;   RADICAL NECK DISSECTION  10/18/2011   Procedure: RADICAL NECK DISSECTION;  Surgeon: Izora Gala, MD;  Location: [facility];  Service: ENT;  Laterality: Right;  RIGHT MODIFIED NECK DISSECTION /POSSIBLE RIGHT THYROIDECTOM   RIGHT/LEFT HEART CATH AND CORONARY ANGIOGRAPHY N/A 02/16/2020   Procedure: RIGHT/LEFT HEART CATH AND CORONARY ANGIOGRAPHY;  Surgeon: Leonie Man, MD;  Location: [facility];  Service: Cardiovascular;  Laterality: N/A;   TEE WITHOUT CARDIOVERSION N/A 03/28/2020   Procedure: TRANSESOPHAGEAL ECHOCARDIOGRAM (TEE);   Surgeon: Buford Dresser, MD;  Location[facility];  Service: Cardiovascular;  Laterality: N/A;   THYROIDECTOMY  10/18/2011   Procedure: THYROIDECTOMY WITH RADICAL NECK DISSECTION;  Surgeon: Izora Gala, MD;  Location: [facility];  Service: ENT;  Laterality: Right;    REVIEW OF SYSTEMS:  Constitutional: positive for fatigue Eyes: negative Ears, nose, mouth, throat, and face: negative Respiratory: negative Cardiovascular: negative Gastrointestinal: negative Genitourinary:negative Integument/breast: negative Hematologic/lymphatic: negative Musculoskeletal:positive for arthralgias and neck pain Neurological: negative Behavioral/Psych: negative Endocrine: negative Allergic/Immunologic: negative   PHYSICAL EXAMINATION: General appearance: alert, cooperative, fatigued, and no distress Head: Normocephalic, without obvious abnormality, atraumatic Neck: no adenopathy, supple, symmetrical, trachea midline, and thyroid not enlarged, symmetric, no tenderness/mass/nodules Lymph nodes: Cervical, supraclavicular, and axillary nodes normal. Resp: clear to auscultation bilaterally Back: symmetric, no curvature. ROM normal. No CVA tenderness. Cardio: regular rate and rhythm, S1, S2 normal, no murmur, click, rub or gallop GI: soft, non-tender; bowel sounds normal; no masses,  no organomegaly Extremities: extremities normal, atraumatic, no cyanosis or edema Neurologic: Alert and oriented X 3, normal strength and tone. Normal symmetric reflexes. Normal coordination and gait  ECOG PERFORMANCE STATUS: 1 - Symptomatic but completely ambulatory  Blood pressure 123/71, pulse 100, temperature (!) 97.5 F (36.4 C), temperature source Tympanic, resp. rate 18, height _0  (1.727 m), SpO2 100 %.  LABORATORY DATA: Lab Results  Component Value Date   WBC 6.7 02/14/2021   HGB 12.4 02/14/2021   HCT 39.3 02/14/2021   MCV 91.6 02/14/2021   PLT 278 02/14/2021      Chemistry      Component Value  Date/Time   NA 139 08/30/2020 1118   NA 140 04/20/2020 0000   NA 141 08/06/2017 1047   K 4.4 08/30/2020 1118   K 3.9 08/06/2017 1047   CL 104 08/30/2020 1118   CL 106 12/09/2012 0806   CO2 26 08/30/2020 1118   CO2 24 08/06/2017 1047   BUN 15 08/30/2020 1118   BUN 18 04/20/2020 0000   BUN 15.4 08/06/2017 1047   CREATININE 1.09 (H) 08/30/2020 1118   CREATININE 1.0 08/06/2017 1047   GLU 89 04/20/2020 0000      Component Value Date/Time   CALCIUM 9.9 08/30/2020 1118  CALCIUM 9.8 08/06/2017 1047   ALKPHOS 116 08/30/2020 1118   ALKPHOS 65 08/06/2017 1047   AST 9 (L) 08/30/2020 1118   AST 11 08/06/2017 1047   ALT 8 08/30/2020 1118   ALT 9 08/06/2017 1047   BILITOT 0.9 08/30/2020 1118   BILITOT 0.66 08/06/2017 1047       RADIOGRAPHIC STUDIES: No results found.  ASSESSMENT AND PLAN: This is a very pleasant 75 years old African-American female with metastatic non-small cell lung cancer, adenocarcinoma with positive EGFR mutation status post several years of treatment with Tarceva and the patient developed positive resistant mutation, T790M. She has been on treatment with Tagrisso for the last 5 years, status post 62 months of treatment.  She has been tolerating her treatment with Tagrisso well but she was recently admitted to [facility] with shortness of breath and she was diagnosed with congestive heart failure secondary to cardiomyopathy with ejection fraction of 20-25%.  This is likely secondary to her treatment with Tagrisso. I discontinued her treatment with Tagrisso on 02/13/2020. The patient is currently on treatment with Tarceva 150 mg p.o. daily started 4 months ago with some interruption because of her hospitalization. The patient resumed her treatment with Tarceva at a dose of 100 mg p.o. daily status post 7 months of treatment.  She is tolerating the current dose fairly well with no significant adverse effects.  She discontinued her treatment on her own several  weeks ago. The patient continues to complain of fatigue and weakness. I recommended for her to have repeat CT scan of the chest, abdomen and pelvis for restaging of her disease.  We will resume her treatment with Tarceva again 100 mg p.o. daily.  Depending on the scan results we will make changes to her treatment plan but if no concerning findings for disease progression, she will continue treatment with Tarceva and I will see her back for follow-up visit in 3 months with repeat blood work. For the weakness, she will continue with physical therapy. For the history of anemia of chronic disease, she has normal hemoglobin and hematocrit on the blood work performed today.  Comprehensive metabolic panel is still pending. The patient was advised to call immediately if she has any other concerning symptoms in the interval.  The patient voices understanding of current disease status and treatment options and is in agreement with the current care plan. All questions were answered. The patient knows to call the clinic with any problems, questions or concerns. We can certainly see the patient much sooner if necessary.  Disclaimer: This note was dictated with voice recognition software. Similar sounding words can inadvertently be transcribed and may not be corrected upon review.

## 2021-02-16 ENCOUNTER — Telehealth: Payer: Medicare PPO

## 2021-02-16 ENCOUNTER — Telehealth: Payer: Self-pay

## 2021-02-16 NOTE — Telephone Encounter (Signed)
  Care Management   Follow Up Note   02/16/2021 Name: Valerie [name] MRN: [id number] DOB: [birth year]   Referred by: [name], MD Reason for referral : Chronic Care Management (RN CM Follow up call - 1st attempt )   An unsuccessful telephone outreach was attempted today. The patient was referred to the case management team for assistance with care management and care coordination.   Follow Up Plan: A HIPPA compliant phone message was left for the patient providing contact information and requesting a return call.   Barb Merino, RN, BSN, CCM Care Management Coordinator [facility]  Direct Phone: [phone]

## 2021-02-17 ENCOUNTER — Other Ambulatory Visit (HOSPITAL_COMMUNITY): Payer: Self-pay

## 2021-02-19 DIAGNOSIS — R54 Age-related physical debility: Secondary | ICD-10-CM | POA: Diagnosis not present

## 2021-02-21 ENCOUNTER — Other Ambulatory Visit (HOSPITAL_COMMUNITY): Payer: Self-pay

## 2021-02-23 ENCOUNTER — Ambulatory Visit (HOSPITAL_COMMUNITY)
Admission: RE | Admit: 2021-02-23 | Discharge: 2021-02-23 | Disposition: A | Discharge status: Home / Self Care | Payer: Medicare PPO | Source: Ambulatory Visit | Attending: Internal Medicine | Admitting: Internal Medicine

## 2021-02-23 ENCOUNTER — Encounter (HOSPITAL_COMMUNITY): Payer: Self-pay

## 2021-02-23 ENCOUNTER — Other Ambulatory Visit: Payer: Self-pay

## 2021-02-23 DIAGNOSIS — R911 Solitary pulmonary nodule: Secondary | ICD-10-CM | POA: Diagnosis not present

## 2021-02-23 DIAGNOSIS — C3490 Malignant neoplasm of unspecified part of unspecified bronchus or lung: Secondary | ICD-10-CM

## 2021-02-23 DIAGNOSIS — I700 Atherosclerosis of aorta: Secondary | ICD-10-CM | POA: Diagnosis not present

## 2021-02-23 DIAGNOSIS — J90 Pleural effusion, not elsewhere classified: Secondary | ICD-10-CM | POA: Diagnosis not present

## 2021-02-23 DIAGNOSIS — N1330 Unspecified hydronephrosis: Secondary | ICD-10-CM | POA: Diagnosis not present

## 2021-02-23 MED ORDER — IOHEXOL 9 MG/ML PO SOLN
ORAL | Status: AC
  Filled 2021-02-23: qty 1000

## 2021-02-23 MED ORDER — IOHEXOL 300 MG/ML  SOLN
100.0000 mL | Freq: Once | INTRAMUSCULAR | Status: AC | PRN
  Administered 2021-02-23: 100 mL via INTRAVENOUS

## 2021-03-01 ENCOUNTER — Telehealth: Payer: Self-pay

## 2021-03-01 NOTE — Telephone Encounter (Signed)
Pt called this morning to obtain results for CT. This request was routed to Dr Julien Nordmann. I attempted to call pt to inform her Dr Julien Nordmann would review and let her know, but her phone is not working. I called pt's husband and explained to him that Dr Julien Nordmann would have to give results.

## 2021-03-13 ENCOUNTER — Other Ambulatory Visit (HOSPITAL_COMMUNITY): Payer: Self-pay

## 2021-03-16 ENCOUNTER — Other Ambulatory Visit (HOSPITAL_COMMUNITY): Payer: Self-pay

## 2021-03-17 ENCOUNTER — Other Ambulatory Visit (HOSPITAL_COMMUNITY): Payer: Self-pay

## 2021-03-20 ENCOUNTER — Other Ambulatory Visit (HOSPITAL_COMMUNITY): Payer: Self-pay

## 2021-03-21 ENCOUNTER — Other Ambulatory Visit (HOSPITAL_COMMUNITY): Payer: Self-pay

## 2021-03-22 DIAGNOSIS — R54 Age-related physical debility: Secondary | ICD-10-CM | POA: Diagnosis not present

## 2021-03-24 ENCOUNTER — Telehealth: Payer: Medicare PPO

## 2021-03-27 ENCOUNTER — Telehealth: Payer: Self-pay | Admitting: Medical Oncology

## 2021-03-27 NOTE — Telephone Encounter (Signed)
".  I have blood coming from my vagina."   Pt states she is not sure if it is blood coming from her urine or vagina. She is on xarelto.  I asked to speak to her husband. " He is outside. He thinks I have a UTI and has me taking antibiotics".  Action- I instructed pt to contact her PCP. I called Dr Bryon Lions office and left message re pts symptoms and to please call her.

## 2021-03-27 NOTE — Telephone Encounter (Signed)
F/U bleeding-Husband contacted. He put pt on antibiotic . He said he has not seen any bleeding today and he changes her pads.   He is waiting on Dr Baird Cancer to call him back and stated she needs to see GYN.

## 2021-03-28 ENCOUNTER — Telehealth: Payer: Self-pay

## 2021-03-28 NOTE — Telephone Encounter (Signed)
Unable to lvm. Returned call to pt about blood in urine.

## 2021-04-02 ENCOUNTER — Other Ambulatory Visit: Payer: Self-pay | Admitting: Internal Medicine

## 2021-04-06 DIAGNOSIS — H2513 Age-related nuclear cataract, bilateral: Secondary | ICD-10-CM | POA: Diagnosis not present

## 2021-04-06 DIAGNOSIS — H52203 Unspecified astigmatism, bilateral: Secondary | ICD-10-CM | POA: Diagnosis not present

## 2021-04-13 ENCOUNTER — Other Ambulatory Visit (HOSPITAL_COMMUNITY): Payer: Self-pay

## 2021-04-17 ENCOUNTER — Inpatient Hospital Stay (HOSPITAL_COMMUNITY)
Admission: EM | Admit: 2021-04-17 | Discharge: 2021-04-23 | DRG: 690 | Disposition: A | Discharge status: Home Health | Payer: Medicare PPO | Attending: Internal Medicine | Admitting: Internal Medicine

## 2021-04-17 ENCOUNTER — Other Ambulatory Visit (HOSPITAL_COMMUNITY): Payer: Self-pay

## 2021-04-17 ENCOUNTER — Observation Stay (HOSPITAL_COMMUNITY): Payer: Medicare PPO

## 2021-04-17 ENCOUNTER — Encounter (HOSPITAL_COMMUNITY): Payer: Self-pay | Admitting: Internal Medicine

## 2021-04-17 ENCOUNTER — Other Ambulatory Visit: Payer: Self-pay

## 2021-04-17 DIAGNOSIS — E890 Postprocedural hypothyroidism: Secondary | ICD-10-CM | POA: Diagnosis present

## 2021-04-17 DIAGNOSIS — I4891 Unspecified atrial fibrillation: Secondary | ICD-10-CM | POA: Diagnosis present

## 2021-04-17 DIAGNOSIS — M1990 Unspecified osteoarthritis, unspecified site: Secondary | ICD-10-CM | POA: Diagnosis present

## 2021-04-17 DIAGNOSIS — D630 Anemia in neoplastic disease: Secondary | ICD-10-CM | POA: Diagnosis present

## 2021-04-17 DIAGNOSIS — Z8619 Personal history of other infectious and parasitic diseases: Secondary | ICD-10-CM

## 2021-04-17 DIAGNOSIS — M810 Age-related osteoporosis without current pathological fracture: Secondary | ICD-10-CM | POA: Diagnosis present

## 2021-04-17 DIAGNOSIS — Z9221 Personal history of antineoplastic chemotherapy: Secondary | ICD-10-CM | POA: Diagnosis not present

## 2021-04-17 DIAGNOSIS — F0390 Unspecified dementia without behavioral disturbance: Secondary | ICD-10-CM | POA: Diagnosis present

## 2021-04-17 DIAGNOSIS — Z86718 Personal history of other venous thrombosis and embolism: Secondary | ICD-10-CM | POA: Diagnosis not present

## 2021-04-17 DIAGNOSIS — R4182 Altered mental status, unspecified: Secondary | ICD-10-CM | POA: Diagnosis not present

## 2021-04-17 DIAGNOSIS — E559 Vitamin D deficiency, unspecified: Secondary | ICD-10-CM | POA: Diagnosis present

## 2021-04-17 DIAGNOSIS — R54 Age-related physical debility: Secondary | ICD-10-CM | POA: Diagnosis not present

## 2021-04-17 DIAGNOSIS — Z902 Acquired absence of lung [part of]: Secondary | ICD-10-CM

## 2021-04-17 DIAGNOSIS — Z79899 Other long term (current) drug therapy: Secondary | ICD-10-CM | POA: Diagnosis not present

## 2021-04-17 DIAGNOSIS — Z20822 Contact with and (suspected) exposure to covid-19: Secondary | ICD-10-CM | POA: Diagnosis present

## 2021-04-17 DIAGNOSIS — N136 Pyonephrosis: Principal | ICD-10-CM | POA: Diagnosis present

## 2021-04-17 DIAGNOSIS — N3000 Acute cystitis without hematuria: Secondary | ICD-10-CM | POA: Diagnosis not present

## 2021-04-17 DIAGNOSIS — C7931 Secondary malignant neoplasm of brain: Secondary | ICD-10-CM | POA: Diagnosis present

## 2021-04-17 DIAGNOSIS — Z923 Personal history of irradiation: Secondary | ICD-10-CM

## 2021-04-17 DIAGNOSIS — Z8249 Family history of ischemic heart disease and other diseases of the circulatory system: Secondary | ICD-10-CM

## 2021-04-17 DIAGNOSIS — I110 Hypertensive heart disease with heart failure: Secondary | ICD-10-CM | POA: Diagnosis present

## 2021-04-17 DIAGNOSIS — Z7901 Long term (current) use of anticoagulants: Secondary | ICD-10-CM

## 2021-04-17 DIAGNOSIS — I5022 Chronic systolic (congestive) heart failure: Secondary | ICD-10-CM | POA: Diagnosis present

## 2021-04-17 DIAGNOSIS — D6481 Anemia due to antineoplastic chemotherapy: Secondary | ICD-10-CM | POA: Diagnosis present

## 2021-04-17 DIAGNOSIS — Q625 Duplication of ureter: Secondary | ICD-10-CM | POA: Diagnosis not present

## 2021-04-17 DIAGNOSIS — D259 Leiomyoma of uterus, unspecified: Secondary | ICD-10-CM | POA: Diagnosis not present

## 2021-04-17 DIAGNOSIS — Z85850 Personal history of malignant neoplasm of thyroid: Secondary | ICD-10-CM

## 2021-04-17 DIAGNOSIS — C779 Secondary and unspecified malignant neoplasm of lymph node, unspecified: Secondary | ICD-10-CM | POA: Diagnosis present

## 2021-04-17 DIAGNOSIS — Z7401 Bed confinement status: Secondary | ICD-10-CM | POA: Diagnosis not present

## 2021-04-17 DIAGNOSIS — N390 Urinary tract infection, site not specified: Secondary | ICD-10-CM | POA: Diagnosis present

## 2021-04-17 DIAGNOSIS — Z66 Do not resuscitate: Secondary | ICD-10-CM | POA: Diagnosis present

## 2021-04-17 DIAGNOSIS — N132 Hydronephrosis with renal and ureteral calculous obstruction: Secondary | ICD-10-CM | POA: Diagnosis not present

## 2021-04-17 DIAGNOSIS — Q630 Accessory kidney: Secondary | ICD-10-CM | POA: Diagnosis not present

## 2021-04-17 DIAGNOSIS — R29898 Other symptoms and signs involving the musculoskeletal system: Secondary | ICD-10-CM | POA: Diagnosis not present

## 2021-04-17 DIAGNOSIS — C3411 Malignant neoplasm of upper lobe, right bronchus or lung: Secondary | ICD-10-CM | POA: Diagnosis present

## 2021-04-17 DIAGNOSIS — T451X5A Adverse effect of antineoplastic and immunosuppressive drugs, initial encounter: Secondary | ICD-10-CM | POA: Diagnosis present

## 2021-04-17 DIAGNOSIS — N3090 Cystitis, unspecified without hematuria: Secondary | ICD-10-CM

## 2021-04-17 DIAGNOSIS — R531 Weakness: Secondary | ICD-10-CM | POA: Diagnosis not present

## 2021-04-17 LAB — URINALYSIS, ROUTINE W REFLEX MICROSCOPIC
Bilirubin Urine: NEGATIVE
Glucose, UA: NEGATIVE mg/dL
Ketones, ur: NEGATIVE mg/dL
Nitrite: NEGATIVE
Protein, ur: 100 mg/dL — AB
RBC / HPF: >50 RBC/hpf — ABNORMAL HIGH (ref 0–5)
Specific Gravity, Urine: 1.016 (ref 1.005–1.030)
WBC, UA: >50 WBC/hpf — ABNORMAL HIGH (ref 0–5)
pH: 7 (ref 5.0–8.0)

## 2021-04-17 LAB — CBC WITH DIFFERENTIAL/PLATELET
Abs Immature Granulocytes: 0.03 10*3/uL (ref 0.00–0.07)
Basophils Absolute: 0 10*3/uL (ref 0.0–0.1)
Basophils Relative: 0 %
Eosinophils Absolute: 0.1 10*3/uL (ref 0.0–0.5)
Eosinophils Relative: 2 %
HCT: 38.3 % (ref 36.0–46.0)
Hemoglobin: 11.8 g/dL — ABNORMAL LOW (ref 12.0–15.0)
Immature Granulocytes: 0 %
Lymphocytes Relative: 13 %
Lymphs Abs: 1.2 10*3/uL (ref 0.7–4.0)
MCH: 29.4 pg (ref 26.0–34.0)
MCHC: 30.8 g/dL (ref 30.0–36.0)
MCV: 95.5 fL (ref 80.0–100.0)
Monocytes Absolute: 0.5 10*3/uL (ref 0.1–1.0)
Monocytes Relative: 6 %
Neutro Abs: 7.3 10*3/uL (ref 1.7–7.7)
Neutrophils Relative %: 79 %
Platelets: 353 10*3/uL (ref 150–400)
RBC: 4.01 MIL/uL (ref 3.87–5.11)
RDW: 14.9 % (ref 11.5–15.5)
WBC: 9.2 10*3/uL (ref 4.0–10.5)
nRBC: 0 % (ref 0.0–0.2)

## 2021-04-17 LAB — RESP PANEL BY RT-PCR (FLU A&B, COVID) ARPGX2
Influenza A by PCR: NEGATIVE
Influenza B by PCR: NEGATIVE
SARS Coronavirus 2 by RT PCR: NEGATIVE

## 2021-04-17 LAB — COMPREHENSIVE METABOLIC PANEL
ALT: 10 U/L (ref 0–44)
AST: 14 U/L — ABNORMAL LOW (ref 15–41)
Albumin: 3.6 g/dL (ref 3.5–5.0)
Alkaline Phosphatase: 77 U/L (ref 38–126)
Anion gap: 6 (ref 5–15)
BUN: 19 mg/dL (ref 8–23)
CO2: 24 mmol/L (ref 22–32)
Calcium: 9.5 mg/dL (ref 8.9–10.3)
Chloride: 112 mmol/L — ABNORMAL HIGH (ref 98–111)
Creatinine, Ser: 1.07 mg/dL — ABNORMAL HIGH (ref 0.44–1.00)
GFR, Estimated: 54 mL/min — ABNORMAL LOW (ref >60)
Glucose, Bld: 137 mg/dL — ABNORMAL HIGH (ref 70–99)
Potassium: 3.6 mmol/L (ref 3.5–5.1)
Sodium: 142 mmol/L (ref 135–145)
Total Bilirubin: 0.6 mg/dL (ref 0.3–1.2)
Total Protein: 6.4 g/dL — ABNORMAL LOW (ref 6.5–8.1)

## 2021-04-17 MED ORDER — ACETAMINOPHEN 650 MG RE SUPP: 650.0000 mg | Freq: Four times a day (QID) | RECTAL | Status: DC | PRN

## 2021-04-17 MED ORDER — ACETAMINOPHEN 325 MG PO TABS
650.0000 mg | ORAL_TABLET | Freq: Four times a day (QID) | ORAL | Status: DC | PRN
  Administered 2021-04-21: 650 mg via ORAL
  Filled 2021-04-17: qty 2

## 2021-04-17 MED ORDER — PIPERACILLIN-TAZOBACTAM 3.375 G IVPB: 3.3750 g | Freq: Three times a day (TID) | INTRAVENOUS | Status: DC

## 2021-04-17 MED ORDER — LACTATED RINGERS IV SOLN: INTRAVENOUS | Status: AC

## 2021-04-17 MED ORDER — SODIUM CHLORIDE 0.9 % IV SOLN
1.0000 g | Freq: Two times a day (BID) | INTRAVENOUS | Status: DC
  Administered 2021-04-17 – 2021-04-19 (×4): 1 g via INTRAVENOUS
  Filled 2021-04-17 (×7): qty 1

## 2021-04-17 NOTE — ED Triage Notes (Signed)
Pt reports 4-5 days of either hematuria or vaginal bleeding-she is unsure. Endorses generalized weakness.

## 2021-04-17 NOTE — H&P (Signed)
History and Physical    ALNITA AYBAR CWC:[id number] DOB: [birth year] DOA: 04/17/2021  PCP: [name], MD  Patient coming from: Home.  Chief Complaint: Hematuria.  HPI: Valerie [name] is a 75 y.o. female with history of non-small cell lung cancer on Tarceva with history of systolic CHF last EF measured was 25 to 30% secondary to [place] presents to the ER with complaints of having persistent hematuria for the last 1 week.  Patient takes Xarelto for history of A. fib and DVT has stopped taking it for the last 1 week due to hematuria.  Despite which patient was still having hematuria and dysuria.  Denies any nausea vomiting but does complain of some left lower quadrant discomfort.  ED Course: The ER patient was afebrile.  Labs show creatinine is increased from 0.8 in June and it is around 1.07 hemoglobin 1.8.  UA shows positive for leukocyte esterase WBC more than 50 RBC more than 50 and bacteria seen.  Patient does have history of ESBL infection and was placed on meropenem cultures and admitted for further work-up.  COVID test was negative.  CT renal studies pending.  Review of Systems: As per HPI, rest all negative.   Past Medical History:  Diagnosis Date   Abnormal Pap smear 2006   Anal fistula    Ankle fracture    Anxiety    Arthritis    Atrophic vaginitis 2008   Cataract    Dementia ([place]) 2009   Dyspareunia 2008   H/O osteoporosis    H/O varicella    H/O vitamin D deficiency    Headache 07/24/2016   Heart murmur    History of measles, mumps, or rubella    History of radiation therapy 07/28/13- 08/10/13   right lung metastasis 5000 cGy 10 sessions   Hypertension    Lung cancer ([place]) dx'd 2002   Lung cancer ([place])    Lung cancer ([place])    Lung metastasis ([place])    PET scan 05/05/13, RUL lung nodule   Metastasis to brain [facility]) dx'd 2008   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   Nodule of right lung CT- 06/03/12   RIGHT UPPER LOBE   Nodule of right lung  06/03/12   Upper Lobe   On antineoplastic chemotherapy    TARCEVA   Osteoporosis 2010   Primary cancer of right upper lobe of lung ([place]) 04/22/2009   Qualifier: Diagnosis of  By: Nils Pyle CMA (AAMA), Leisha     Shortness of breath    hx lung ca    Status post chemotherapy 2003   CARBOPLATIN/PACLITAXEL /STATUS POST CLINICAL TRAIL OF CELEBREX AND IRESSA AT [facility] FOR 1 YEAR   Status post radiation therapy 2003   LEFT LUNG   Status post radiation therapy 11/07/2005   WHOLE BRAIN: DR Larkin In[facility]   Status post radiation therapy 06/02/2008   GAMMA KNIFE OF RESECTED CAVITAY   Thyroid adenoma    ?   Thyroid cancer ([place]) 10/18/11 bx   adenoid nodules    Yeast infection     Past Surgical History:  Procedure Laterality Date   BRAIN SURGERY     INTRAVASCULAR ULTRASOUND/IVUS N/A 02/16/2020   Procedure: Intravascular Ultrasound/IVUS;  Surgeon: Leonie Man, MD;  Location: [place] [facility];  Service: Cardiovascular;  Laterality: N/A;   LEFT LOWER LOBECTOMY  05/2001   LEFT OCCIPITAL CRANIOTOMY  09/21/2005   tumor   LUNG LOBECTOMY     NECK SURGERY  ORIF ANKLE FRACTURE Left 10/02/2014   Procedure: OPEN REDUCTION INTERNAL FIXATION (ORIF) ANKLE FRACTURE;  Surgeon: Alta Corning, MD;  Location: [facility];  Service: Orthopedics;  Laterality: Left;   ORIF FEMUR FRACTURE Right 06/11/2019   Procedure: OPEN REDUCTION INTERNAL FIXATION (ORIF) DISTAL FEMUR FRACTURE;  Surgeon: Renette Butters, MD;  Location: [place];  Service: Orthopedics;  Laterality: Right;   ORIF TIBIA PLATEAU Right 06/11/2019   Procedure: Open Reduction Internal Fixation (Orif) Tibial Plateau;  Surgeon: Renette Butters, MD;  Location: [place];  Service: Orthopedics;  Laterality: Right;   RADICAL NECK DISSECTION  10/18/2011   Procedure: RADICAL NECK DISSECTION;  Surgeon: Izora Gala, MD;  Location: [place];  Service: ENT;  Laterality: Right;  RIGHT MODIFIED NECK DISSECTION /POSSIBLE RIGHT THYROIDECTOM   RIGHT/LEFT HEART CATH AND CORONARY  ANGIOGRAPHY N/A 02/16/2020   Procedure: RIGHT/LEFT HEART CATH AND CORONARY ANGIOGRAPHY;  Surgeon: Leonie Man, MD;  Location: [place] [facility];  Service: Cardiovascular;  Laterality: N/A;   TEE WITHOUT CARDIOVERSION N/A 03/28/2020   Procedure: TRANSESOPHAGEAL ECHOCARDIOGRAM (TEE);  Surgeon: Buford Dresser, MD;  Location: [facility];  Service: Cardiovascular;  Laterality: N/A;   THYROIDECTOMY  10/18/2011   Procedure: THYROIDECTOMY WITH RADICAL NECK DISSECTION;  Surgeon: Izora Gala, MD;  Location: [place];  Service: ENT;  Laterality: Right;     reports that she has never smoked. She has never used smokeless tobacco. She reports that she does not drink alcohol and does not use drugs.  No Known Allergies  Family History  Problem Relation Age of Onset   Gait disorder Mother    Cancer Mother        Meningioma   Cancer Father        Pancreatic   Heart failure Sister    Cancer Maternal Aunt        menigeoma    Prior to Admission medications   Medication Sig Start Date End Date Taking? Authorizing Provider  Ascorbic Acid (VITAMIN C) 100 MG tablet Take 100 mg by mouth every evening. Gummie    [provider]  Cholecalciferol (D3 PO) Take 1 capsule by mouth daily.    [provider]  diphenhydrAMINE (BENADRYL) 25 MG tablet Take 25 mg by mouth every 6 (six) hours as needed for allergies.    [provider]  donepezil (ARICEPT) 10 MG tablet TAKE 1 TABLET BY MOUTH ONCE DAILY IN THE EVENING 04/03/21   [name], MD  erlotinib (TARCEVA) 100 MG tablet TAKE 1 TABLET (100 MG TOTAL) BY MOUTH DAILY. TAKE ON AN EMPTY STOMACH 1 HOUR BEFORE MEALS OR 2 HOURS AFTER 02/14/21 02/14/22  Curt Bears, MD  hydrocerin (EUCERIN) CREA Apply 1 application topically 2 (two) times daily. To back and buttocks. 06/13/20   Elodia Florence., MD  loperamide (IMODIUM A-D) 2 MG tablet Take 4 mg by mouth in the morning and at bedtime.    [provider]  MAGNESIUM PO  Take 1 tablet by mouth daily.    [provider]  metoprolol succinate (TOPROL-XL) 25 MG 24 hr tablet Take 1 tablet (25 mg total) by mouth daily. 05/24/20   Buford Dresser, MD  Multiple Vitamin (MULTI-VITAMINS) TABS Take 1 tablet by mouth every evening.     [provider]  potassium chloride SA (KLOR-CON) 20 MEQ tablet Take 20 mEq by mouth daily.    [provider]  prochlorperazine (COMPAZINE) 10 MG tablet Take 1 tablet (10 mg total) by mouth every 6 (six)  hours as needed for nausea or vomiting. 05/30/20   Curt Bears, MD  vitamin B-12 (CYANOCOBALAMIN) 1000 MCG tablet Take 1,000 mcg by mouth every evening.    [provider]  XARELTO 20 MG TABS tablet Take 1 tablet (20 mg total) by mouth every evening. 01/23/21   [name], MD    Physical Exam: Constitutional: Moderately built and nourished. Vitals:   04/17/21 2045 04/17/21 2100 04/17/21 2130 04/17/21 2200  BP: 117/73 110/60 122/70   Pulse: 81 81 82   Resp: 18 17 (!) 23   Temp:    98.2 F (36.8 C)  TempSrc:    Oral  SpO2: 99% 100% 100%   Weight:   72.6 kg    Eyes: Anicteric no pallor. ENMT: No discharge from the ears eyes nose and mouth. Neck: No mass felt.  No neck rigidity. Respiratory: No rhonchi or crepitations. Cardiovascular: S1-S2 heard. Abdomen: Soft nontender bowel sound present. Musculoskeletal: No edema. Skin: No rash.  Below the right knee there is a small skin discoloration which patient states was from previous surgery. Neurologic: Alert awake oriented to name place and person moving all extremities. Psychiatric: Oriented to name place and person.   Labs on Admission: I have personally reviewed following labs and imaging studies  CBC: Recent Labs  Lab 04/17/21 1544  WBC 9.2  NEUTROABS 7.3  HGB 11.8*  HCT 38.3  MCV 95.5  PLT 235   Basic Metabolic Panel: Recent Labs  Lab 04/17/21 1544  NA 142  K 3.6  CL 112*  CO2 24  GLUCOSE 137*  BUN 19   CREATININE 1.07*  CALCIUM 9.5   GFR: Estimated Creatinine Clearance: 45.8 mL/min (A) (by C-G formula based on SCr of 1.07 mg/dL (H)). Liver Function Tests: Recent Labs  Lab 04/17/21 1544  AST 14*  ALT 10  ALKPHOS 77  BILITOT 0.6  PROT 6.4*  ALBUMIN 3.6   No results for input(s): LIPASE, AMYLASE in the last 168 hours. No results for input(s): AMMONIA in the last 168 hours. Coagulation Profile: No results for input(s): INR, PROTIME in the last 168 hours. Cardiac Enzymes: No results for input(s): CKTOTAL, CKMB, CKMBINDEX, TROPONINI in the last 168 hours. BNP (last 3 results) No results for input(s): PROBNP in the last 8760 hours. HbA1C: No results for input(s): HGBA1C in the last 72 hours. CBG: No results for input(s): GLUCAP in the last 168 hours. Lipid Profile: No results for input(s): CHOL, HDL, LDLCALC, TRIG, CHOLHDL, LDLDIRECT in the last 72 hours. Thyroid Function Tests: No results for input(s): TSH, T4TOTAL, FREET4, T3FREE, THYROIDAB in the last 72 hours. Anemia Panel: No results for input(s): VITAMINB12, FOLATE, FERRITIN, TIBC, IRON, RETICCTPCT in the last 72 hours. Urine analysis:    Component Value Date/Time   COLORURINE AMBER (A) 04/17/2021 2100   APPEARANCEUR CLOUDY (A) 04/17/2021 2100   LABSPEC 1.016 04/17/2021 2100   PHURINE 7.0 04/17/2021 2100   GLUCOSEU NEGATIVE 04/17/2021 2100   HGBUR MODERATE (A) 04/17/2021 2100   BILIRUBINUR NEGATIVE 04/17/2021 2100   BILIRUBINUR negative 05/25/2019 [place] 04/17/2021 2100   PROTEINUR 100 (A) 04/17/2021 2100   UROBILINOGEN 0.2 05/25/2019 1653   UROBILINOGEN 0.2 10/20/2014 1636   NITRITE NEGATIVE 04/17/2021 2100   LEUKOCYTESUR LARGE (A) 04/17/2021 2100   Sepsis Labs: @LABRCNTIP (procalcitonin:4,lacticidven:4) )No results found for this or any previous visit (from the past 240 hour(s)).   Radiological Exams on Admission: No results found.   Assessment/Plan Principal Problem:   Complicated  UTI (urinary  tract infection) Active Problems:   Primary cancer of right upper lobe of lung (HCC)   Dementia (HCC)   History of DVT of lower extremity   DNR (do not resuscitate)    Complicated UTI and hematuria with history of ESBL presently on meropenem.  Follow urine cultures.  Since patient is complaining of left lower quadrant pain I have ordered a CT renal study to make sure there is no obstructive uropathy.  Closely follow CBC.  If persist may need urology input. History of DVT and A. fib on Xarelto which patient has stopped taking for a week now. Anemia appears to be chronic.  However since patient has which area closely follow CBC. History of systolic heart failure secondary to [place].  Appears compensated.  Closely monitor.  Last EF was around 25 to 30%. Non-small cell lung cancer being followed by Dr. Julien Nordmann oncologist.  On Tarceva. History of dementia per the chart.  Home medications needs to be verified.   DVT prophylaxis: Avoiding anticoagulation due to persistent hematuria.  Avoiding SCDs due to history of DVT. Code Status: DNR. Family Communication: We will need to discuss with patient's husband. Disposition Plan: Home. Consults called: None. Admission status: Observation.   Rise Patience MD [facility] Pager [phone].  If 7PM-7AM, please contact night-coverage [email or web address] Password [email or web address]  04/17/2021, 10:13 PM

## 2021-04-17 NOTE — ED Provider Notes (Signed)
Emergency Medicine Provider Triage Evaluation Note  Valerie [name] , a 75 y.o. female  was evaluated in triage.  Pt stating that she is seeing large amounts of blood when she pulls her pants down. Not sure if urinary or vaginal. Hx of metastatic lung cx  Review of Systems  Positive: Vaginal bleeding and weakness Negative: Cp, sob  Physical Exam  BP 110/80 (BP Location: Left Arm)   Pulse (!) 115   Temp 97.7 F (36.5 C)   Resp 15   SpO2 99%  Gen:   Awake, no distress   Resp:  Normal effort  MSK:   Moves extremities without difficulty  Other:  Tachycardic and ill appearing. Skin jaundiced  Medical Decision Making  Medically screening exam initiated at 3:48 PM.  Appropriate orders placed.  ELVERNA CAFFEE was informed that the remainder of the evaluation will be completed by another provider, this initial triage assessment does not replace that evaluation, and the importance of remaining in the ED until their evaluation is complete.  Cancer hx, abnormal bleeding and weak.   Rhae Hammock, PA-C 04/17/21 1551    Godfrey Pick, MD 04/19/21 [phone]

## 2021-04-17 NOTE — Progress Notes (Addendum)
Pharmacy Antibiotic Note  Valerie [name] is a 75 y.o. female admitted on 04/17/2021 with UTI.  Pharmacy has been consulted for Zosyn > Meropenem dosing.  Plan: Zosyn 3.375g IV q8h (4 hour infusion). > changed to Meropenem 1g IV q12h -Monitor renal function, clinical status, and antibiotic plan  Weight: 72.6 kg (160 lb 0.9 oz)  Temp (24hrs), Avg:98.4 F (36.9 C), Min:97.7 F (36.5 C), Max:99.1 F (37.3 C)  Recent Labs  Lab 04/17/21 1544  WBC 9.2  CREATININE 1.07*    Estimated Creatinine Clearance: 45.8 mL/min (A) (by C-G formula based on SCr of 1.07 mg/dL (H)).    No Known Allergies  Antimicrobials this admission: [facility] 8/29 >>  Thank you for allowing pharmacy to be a part of this patient's care.  Joetta Manners, PharmD, [facility] Emergency Medicine Clinical Pharmacist ED RPh Phone: [place]: [phone]

## 2021-04-18 ENCOUNTER — Telehealth: Payer: Medicare PPO

## 2021-04-18 ENCOUNTER — Ambulatory Visit (HOSPITAL_BASED_OUTPATIENT_CLINIC_OR_DEPARTMENT_OTHER): Payer: Medicare PPO | Admitting: Cardiology

## 2021-04-18 ENCOUNTER — Ambulatory Visit: Payer: Self-pay

## 2021-04-18 DIAGNOSIS — F0390 Unspecified dementia without behavioral disturbance: Secondary | ICD-10-CM

## 2021-04-18 DIAGNOSIS — N390 Urinary tract infection, site not specified: Secondary | ICD-10-CM | POA: Diagnosis not present

## 2021-04-18 DIAGNOSIS — C3411 Malignant neoplasm of upper lobe, right bronchus or lung: Secondary | ICD-10-CM

## 2021-04-18 DIAGNOSIS — R269 Unspecified abnormalities of gait and mobility: Secondary | ICD-10-CM

## 2021-04-18 DIAGNOSIS — R5381 Other malaise: Secondary | ICD-10-CM

## 2021-04-18 LAB — CBC
HCT: 34.9 % — ABNORMAL LOW (ref 36.0–46.0)
HCT: 36.7 % (ref 36.0–46.0)
Hemoglobin: 10.8 g/dL — ABNORMAL LOW (ref 12.0–15.0)
Hemoglobin: 11.1 g/dL — ABNORMAL LOW (ref 12.0–15.0)
MCH: 28.9 pg (ref 26.0–34.0)
MCH: 29.3 pg (ref 26.0–34.0)
MCHC: 30.2 g/dL (ref 30.0–36.0)
MCHC: 30.9 g/dL (ref 30.0–36.0)
MCV: 94.6 fL (ref 80.0–100.0)
MCV: 95.6 fL (ref 80.0–100.0)
Platelets: 299 10*3/uL (ref 150–400)
Platelets: 324 10*3/uL (ref 150–400)
RBC: 3.69 MIL/uL — ABNORMAL LOW (ref 3.87–5.11)
RBC: 3.84 MIL/uL — ABNORMAL LOW (ref 3.87–5.11)
RDW: 15.1 % (ref 11.5–15.5)
RDW: 15.3 % (ref 11.5–15.5)
WBC: 6.4 10*3/uL (ref 4.0–10.5)
WBC: 8.1 10*3/uL (ref 4.0–10.5)
nRBC: 0 % (ref 0.0–0.2)
nRBC: 0 % (ref 0.0–0.2)

## 2021-04-18 LAB — BASIC METABOLIC PANEL
Anion gap: 9 (ref 5–15)
BUN: 18 mg/dL (ref 8–23)
CO2: 22 mmol/L (ref 22–32)
Calcium: 9.4 mg/dL (ref 8.9–10.3)
Chloride: 111 mmol/L (ref 98–111)
Creatinine, Ser: 0.91 mg/dL (ref 0.44–1.00)
GFR, Estimated: >60 mL/min (ref >60)
Glucose, Bld: 111 mg/dL — ABNORMAL HIGH (ref 70–99)
Potassium: 3.5 mmol/L (ref 3.5–5.1)
Sodium: 142 mmol/L (ref 135–145)

## 2021-04-18 MED ORDER — ZINC OXIDE 40 % EX OINT
TOPICAL_OINTMENT | Freq: Four times a day (QID) | CUTANEOUS | Status: DC | PRN
  Filled 2021-04-18 (×2): qty 57

## 2021-04-18 MED ORDER — DONEPEZIL HCL 10 MG PO TABS
10.0000 mg | ORAL_TABLET | Freq: Every evening | ORAL | Status: DC
  Administered 2021-04-18 – 2021-04-22 (×4): 10 mg via ORAL
  Filled 2021-04-18 (×5): qty 1

## 2021-04-18 NOTE — Progress Notes (Signed)
PROGRESS NOTE    Valerie [name]  HLK:[id number] DOB: 01-May-1946 DOA: 04/17/2021 PCP: [name], MD   Brief Narrative:  This 75 years old female with history of non-small cell lung cancer on Tarceva,   history of systolic CHF,  last LVEF measured 25 to 30% secondary to [place] presents in the ED with complaints of persistent hematuria for last 1 week.  Patient takes Xarelto for history of A. fib and DVT but had to stop taking it for last 1 week due to hematuria.  Despite which patient is still having hematuria and dysuria, denies any nausea, vomiting but complains of some left lower quadrant discomfort.  In the ED UA consistent with UTI. Patient does have a history of ESBL infection and was placed on meropenem based on cultures on last admission.   Assessment & Plan:   Principal Problem:   Complicated UTI (urinary tract infection) Active Problems:   Primary cancer of right upper lobe of lung (HCC)   Dementia (HCC)   History of DVT of lower extremity   DNR (do not resuscitate)   Complicated UTI / Hematuria: Patient presented with hematuria, dysuria with history of ESBL. UA consistent with UTI.  Follow-up urine cultures. 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 No obstructing stone identified. Patient reports hematuria has improved,  urine is getting clear.   History of DVT and A. fib on Xarelto: Continue to keep it on hold since patient is having hematuria. Hemoglobin remained stable.  Chronic anemia: Hb remains stable.  History of systolic heart failure secondary to [place]: Appears compensated,  does not appear volume overloaded. Last LVEF 25 to 30%. Continue current home medications.   Non-small cell lung cancer : following with Dr. Julien Nordmann.  On Tarceva  Dementia:  No agitation or delirium. Resume Aricept.    DVT prophylaxis:   None Code Status: DNR Family Communication: No family at bed side. Disposition Plan:  Status is: Observation  The patient remains OBS appropriate and will d/c before 2 midnights.  Dispo: The patient is from: Home              Anticipated d/c is to: Home              Patient currently is not medically stable to d/c.   Difficult to place patient No  Consultants:  None  Procedures:  Antimicrobials:   Anti-infectives (From admission, onward)    Start     Dose/Rate Route Frequency Ordered Stop   04/17/21 2215  meropenem (MERREM) 1 g in sodium chloride 0.9 % 100 mL IVPB        1 g 200 mL/hr over 30 Minutes Intravenous Every 12 hours 04/17/21 2203     04/17/21 2200  piperacillin-tazobactam (ZOSYN) IVPB 3.375 g  Status:  Discontinued        3.375 g 12.5 mL/hr over 240 Minutes Intravenous Every 8 hours 04/17/21 2151 04/17/21 2200       Subjective: Seen and examined at bedside.  Overnight events noted.   She reports hematuria is improving.  Urine is getting clear.  Objective: Vitals:   04/18/21 0700 04/18/21 0745 04/18/21 0843 04/18/21 0904  BP: 113/61 118/62 120/74 (!) 114/97  Pulse: 70 74 75 95  Resp: 17 20 15 17   Temp:   98 F (36.7 C) 98.2 F (36.8 C)  TempSrc:   Oral   SpO2: 97% 98% 99% 99%  Weight:      Height:    5\' 7"  (1.702 m)    Intake/Output Summary (Last 24 hours) at 04/18/2021 1631 Last data filed at 04/18/2021 1300 Gross per 24 hour  Intake 420 ml  Output --  Net 420 ml   Filed Weights   04/17/21 2130  Weight: 72.6 kg    Examination:  General exam: Appears comfortable, not in any acute distress. Respiratory system: Clear to auscultation. Respiratory effort normal. Cardiovascular system: S1 & S2 heard, RRR. No JVD, murmurs, rubs, gallops or clicks. No pedal edema. Gastrointestinal system: Abdomen is nondistended, soft and nontender. No organomegaly or masses felt. Normal bowel sounds heard. Central nervous system: Alert and oriented. No focal  neurological deficits. Extremities: No edema, no cyanosis, no clubbing. Skin: No rashes, lesions or ulcers Psychiatry: Judgement and insight appear normal. Mood & affect appropriate.     Data Reviewed: I have personally reviewed following labs and imaging studies  CBC: Recent Labs  Lab 04/17/21 1544 04/18/21 0011 04/18/21 1007  WBC 9.2 8.1 6.4  NEUTROABS 7.3  --   --   HGB 11.8* 10.8* 11.1*  HCT 38.3 34.9* 36.7  MCV 95.5 94.6 95.6  PLT 353 299 786   Basic Metabolic Panel: Recent Labs  Lab 04/17/21 1544 04/18/21 0453  NA 142 142  K 3.6 3.5  CL 112* 111  CO2 24 22  GLUCOSE 137* 111*  BUN 19 18  CREATININE 1.07* 0.91  CALCIUM 9.5 9.4   GFR: Estimated Creatinine Clearance: 51.9 mL/min (by C-G formula based on SCr of 0.91 mg/dL). Liver Function Tests: Recent Labs  Lab 04/17/21 1544  AST 14*  ALT 10  ALKPHOS 77  BILITOT 0.6  PROT 6.4*  ALBUMIN 3.6   No results for input(s): LIPASE, AMYLASE in the last 168 hours. No results for input(s): AMMONIA in the last 168 hours. Coagulation Profile: No results for input(s): INR, PROTIME in the last 168 hours. Cardiac Enzymes: No results for input(s): CKTOTAL, CKMB, CKMBINDEX, TROPONINI in the last 168 hours. BNP (last 3 results) No results for input(s): PROBNP in the last 8760 hours. HbA1C: No results for input(s): HGBA1C in the last 72 hours. CBG: No results for input(s): GLUCAP in the last 168 hours. Lipid Profile: No results for input(s): CHOL, HDL, LDLCALC, TRIG, CHOLHDL, LDLDIRECT in the last 72 hours. Thyroid Function Tests: No results for input(s): TSH, T4TOTAL, FREET4, T3FREE, THYROIDAB in the last 72 hours. Anemia Panel: No results for input(s): VITAMINB12, FOLATE, FERRITIN, TIBC, IRON, RETICCTPCT in the last 72 hours. Sepsis Labs: No results for input(s): PROCALCITON, LATICACIDVEN in the last 168 hours.  Recent Results (from the past 240 hour(s))  Resp Panel by RT-PCR (Flu A&B, Covid) Nasopharyngeal Swab      Status: None   Collection Time: 04/17/21  3:52 PM   Specimen: Nasopharyngeal Swab; Nasopharyngeal(NP) swabs in vial transport medium  Result Value Ref Range Status   SARS Coronavirus 2 by RT PCR NEGATIVE NEGATIVE Final    Comment: (NOTE) SARS-CoV-2 target nucleic acids are NOT DETECTED.  The SARS-CoV-2 RNA is generally detectable in upper respiratory specimens during the acute phase of infection. The lowest concentration of SARS-CoV-2 viral copies this assay can detect is 138 copies/mL. A negative result does not preclude SARS-Cov-2 infection and should not be used as the sole basis for treatment or other patient management decisions. A negative result may occur with  improper specimen collection/handling, submission of specimen other than nasopharyngeal swab, presence of viral mutation(s) within the  areas targeted by this assay, and inadequate number of viral copies(<138 copies/mL). A negative result must be combined with clinical observations, patient history, and epidemiological information. The expected result is Negative.  Fact Sheet for Patients:  EntrepreneurPulse.com.au  Fact Sheet for Healthcare Providers:  IncredibleEmployment.be  This test is no t yet approved or cleared by the [place] FDA and  has been authorized for detection and/or diagnosis of SARS-CoV-2 by FDA under an Emergency Use Authorization (EUA). This EUA will remain  in effect (meaning this test can be used) for the duration of the COVID-19 declaration under Section 564(b)(1) of the Act, 21 U.S.C.section 360bbb-3(b)(1), unless the authorization is terminated  or revoked sooner.       Influenza A by PCR NEGATIVE NEGATIVE Final   Influenza B by PCR NEGATIVE NEGATIVE Final    Comment: (NOTE) The Xpert Xpress SARS-CoV-2/FLU/RSV plus assay is intended as an aid in the diagnosis of influenza from Nasopharyngeal swab specimens and should not be used as a sole  basis for treatment. Nasal washings and aspirates are unacceptable for Xpert Xpress SARS-CoV-2/FLU/RSV testing.  Fact Sheet for Patients: EntrepreneurPulse.com.au  Fact Sheet for Healthcare Providers: IncredibleEmployment.be  This test is not yet approved or cleared by the [place] FDA and has been authorized for detection and/or diagnosis of SARS-CoV-2 by FDA under an Emergency Use Authorization (EUA). This EUA will remain in effect (meaning this test can be used) for the duration of the COVID-19 declaration under Section 564(b)(1) of the Act, 21 U.S.C. section 360bbb-3(b)(1), unless the authorization is terminated or revoked.  Performed at [facility], [place] [address], [place], [place] [address]     Radiology Studies: CT RENAL STONE STUDY  Result Date: 04/17/2021 CLINICAL DATA:  Flank pain.  Concern for kidney stone. EXAM: CT ABDOMEN AND PELVIS WITHOUT CONTRAST TECHNIQUE: Multidetector CT imaging of the abdomen and pelvis was performed following the standard protocol without IV contrast. COMPARISON:  CT dated 02/23/2021. FINDINGS: Evaluation of this exam is limited in the absence of intravenous contrast. Lower chest: There is eventration of the left hemidiaphragm. The visualized right lung base is clear. No intra-abdominal free air or free fluid. Hepatobiliary: No focal liver abnormality is seen. No gallstones, gallbladder wall thickening, or biliary dilatation. Pancreas: Choose loss Spleen: Normal in size without focal abnormality. Adrenals/Urinary Tract: The adrenal glands are unremarkable. 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 Nonobstructing stone in the upper pole of the left kidney measures approximately 9 mm. There is a 2 mm nonobstructing left renal inferior pole calculus. There is mild left hydronephrosis and hydroureter. No obstructing stone identified. Several nonobstructing right renal calculi measure  up to 5 mm in the inferior pole of the right kidney. There is no hydronephrosis on the right. The right ureter appears unremarkable. There is trabeculated appearance of the bladder wall. Stomach/Bowel: There is no bowel obstruction or active inflammation. There is a 3 cm diverticulum of the gastric fundus. No active inflammatory changes. The appendix is normal. Vascular/Lymphatic: Mild aortoiliac atherosclerotic disease. The IVC is unremarkable no venous gas. There is no adenopathy. Reproductive: The uterus is retroverted. Calcified uterine fibroid noted. No adnexal masses. Other: None Musculoskeletal: Osteopenia with scoliosis and degenerative changes of the spine. Old L1 and L4 compression fractures. No acute osseous pathology. IMPRESSION: 1. 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 No obstructing stone identified. Correlation with urinalysis recommended to exclude UTI. 2. Bilateral nonobstructing renal calculi. No  hydronephrosis on the right. 3. No bowel obstruction. Normal appendix. 4. Aortic Atherosclerosis (ICD10-I70.0). Electronically Signed   By: Anner Crete M.D.   On: 04/17/2021 22:54     Scheduled Meds: Continuous Infusions:  lactated ringers 50 mL/hr at 04/18/21 1058   meropenem (MERREM) IV 1 g (04/18/21 1519)     LOS: 0 days    Time spent: 35 mins    Iliyana Convey, MD [facility]   If 7PM-7AM, please contact night-coverage

## 2021-04-18 NOTE — Progress Notes (Signed)
New Admission Note:   Arrival Method: stretcher  Mental Orientation: alert  and oriented X 4  Telemetry: none  Assessment: Completed Skin: dry and intact  IV: none , right forearm saline lock   Pain: none   Tubes: none  Safety Measures: Safety Fall Prevention Plan has been given, discussed and signed Admission: Completed [facility] Orientation: Patient has been orientated to the room, unit and staff.  Family: unavailable   Orders have been reviewed and implemented. Will continue to monitor the patient. Call light has been placed within reach and bed alarm has been activated.   Dolores Hoose, RN

## 2021-04-18 NOTE — ED Notes (Signed)
Removed pt's purewick and assisted pt with pericare. Changed pt's linen, the brief, and the chux pad. Placed new purewick on the patient. Pt now eating breakfast with callbell within reach.

## 2021-04-18 NOTE — ED Provider Notes (Signed)
[facility] EMERGENCY DEPARTMENT Provider Note   CSN: [id number] Arrival date & time: 04/17/21  1507     History No chief complaint on file.   Valerie [name] is a 75 y.o. female who presents emergency department for evaluation of blood in her underwear.  Patient states that she has been having dysuria and increased frequency and is unsure if the blood is coming from her urine or from her vagina.  Denies fever, chest pain, shortness of breath, abdominal pain, nausea, vomiting or other systemic symptoms.  Initial evaluation, patient is hemodynamically stable with foul-smelling urine in the room.  HPI     Past Medical History:  Diagnosis Date   Abnormal Pap smear 2006   Anal fistula    Ankle fracture    Anxiety    Arthritis    Atrophic vaginitis 2008   Cataract    Dementia ([place]) 2009   Dyspareunia 2008   H/O osteoporosis    H/O varicella    H/O vitamin D deficiency    Headache 07/24/2016   Heart murmur    History of measles, mumps, or rubella    History of radiation therapy 07/28/13- 08/10/13   right lung metastasis 5000 cGy 10 sessions   Hypertension    Lung cancer ([place]) dx'd 2002   Lung cancer ([place])    Lung cancer ([place])    Lung metastasis ([place])    PET scan 05/05/13, RUL lung nodule   Metastasis to brain [facility]) dx'd 2008   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   Nodule of right lung CT- 06/03/12   RIGHT UPPER LOBE   Nodule of right lung 06/03/12   Upper Lobe   On antineoplastic chemotherapy    TARCEVA   Osteoporosis 2010   Primary cancer of right upper lobe of lung ([place]) 04/22/2009   Qualifier: Diagnosis of  By: Nils Pyle CMA (AAMA), Leisha     Shortness of breath    hx lung ca    Status post chemotherapy 2003   CARBOPLATIN/PACLITAXEL /STATUS POST CLINICAL TRAIL OF CELEBREX AND IRESSA AT [facility] FOR 1 YEAR   Status post radiation therapy 2003   LEFT LUNG   Status post radiation therapy 11/07/2005   WHOLE BRAIN: DR Larkin In[facility]   Status post  radiation therapy 06/02/2008   GAMMA KNIFE OF RESECTED CAVITAY   Thyroid adenoma    ?   Thyroid cancer ([place]) 10/18/11 bx   adenoid nodules    Yeast infection     Patient Active Problem List   Diagnosis Date Noted   Complicated UTI (urinary tract infection) 04/17/2021   Malnutrition of moderate degree 06/13/2020   Rash and nonspecific skin eruption    Metastatic malignant neoplasm (HCC)    Failure to thrive in adult    Asymptomatic bacteriuria    Pressure injury of skin 06/03/2020   Cellulitis 06/03/2020   SVT (supraventricular tachycardia) ([place]) 03/26/2020   Normocytic anemia 03/24/2020   Urinary tract infection without hematuria    AMS (altered mental status) 03/23/2020   Hypokalemia 03/23/2020   Prolonged Q-T interval on ECG 03/23/2020   DNR (do not resuscitate) 03/23/2020   MRSA bacteremia 03/23/2020   Acute combined systolic and diastolic heart failure (HCC)    Elevated troponin    Dilated cardiomyopathy ([place])    Coronary artery disease involving native coronary artery of native heart without angina pectoris    Congestive heart failure (CHF) ([place]) 02/14/2020   Pyelonephritis 09/10/2019   Acute blood loss anemia  06/12/2019   Closed fracture of distal end of right femur, initial encounter ([place]) 06/11/2019   Closed fracture of lateral portion of right tibial plateau 06/11/2019   History of DVT of lower extremity 06/11/2019   Blurry vision 01/06/2019   Headache 07/24/2016   Cough 08/24/2015   Chronic diarrhea 08/24/2015   Sepsis ([place])    Encounter for antineoplastic chemotherapy 01/31/2015   Urinary retention 10/20/2014   Ileus ([place]) 10/20/2014   Cellulitis of leg, right 10/20/2014   Fracture of right proximal fibula    Ankle fracture 10/02/2014   Ankle fracture 10/02/2014   Lung cancer ([place]) 10/02/2014   Closed fibular fracture 10/02/2014   Dementia ([place]) 10/02/2014   Lower urinary tract infectious disease 10/02/2014   Reactive airway disease 10/02/2014   Fall  10/02/2014   Closed left ankle fracture 10/01/2014   Right fibular fracture 10/01/2014   Lung metastasis ([place])    LESION, ANUS 04/26/2009   DIARRHEA 04/26/2009   Primary cancer of right upper lobe of lung ([place]) 04/22/2009   Malignant neoplasm of brain ([place]) 04/22/2009   VITAMIN D DEFICIENCY 04/22/2009   ANAL FISTULA 04/22/2009   OSTEOPOROSIS 04/22/2009    Past Surgical History:  Procedure Laterality Date   BRAIN SURGERY     INTRAVASCULAR ULTRASOUND/IVUS N/A 02/16/2020   Procedure: Intravascular Ultrasound/IVUS;  Surgeon: Leonie Man, MD;  Location: [facility];  Service: Cardiovascular;  Laterality: N/A;   LEFT LOWER LOBECTOMY  05/2001   LEFT OCCIPITAL CRANIOTOMY  09/21/2005   tumor   LUNG LOBECTOMY     NECK SURGERY     ORIF ANKLE FRACTURE Left 10/02/2014   Procedure: OPEN REDUCTION INTERNAL FIXATION (ORIF) ANKLE FRACTURE;  Surgeon: Alta Corning, MD;  Location: [facility];  Service: Orthopedics;  Laterality: Left;   ORIF FEMUR FRACTURE Right 06/11/2019   Procedure: OPEN REDUCTION INTERNAL FIXATION (ORIF) DISTAL FEMUR FRACTURE;  Surgeon: Renette Butters, MD;  Location: [facility];  Service: Orthopedics;  Laterality: Right;   ORIF TIBIA PLATEAU Right 06/11/2019   Procedure: Open Reduction Internal Fixation (Orif) Tibial Plateau;  Surgeon: Renette Butters, MD;  Location: [facility];  Service: Orthopedics;  Laterality: Right;   RADICAL NECK DISSECTION  10/18/2011   Procedure: RADICAL NECK DISSECTION;  Surgeon: Izora Gala, MD;  Location: [facility];  Service: ENT;  Laterality: Right;  RIGHT MODIFIED NECK DISSECTION /POSSIBLE RIGHT THYROIDECTOM   RIGHT/LEFT HEART CATH AND CORONARY ANGIOGRAPHY N/A 02/16/2020   Procedure: RIGHT/LEFT HEART CATH AND CORONARY ANGIOGRAPHY;  Surgeon: Leonie Man, MD;  Location: [facility];  Service: Cardiovascular;  Laterality: N/A;   TEE WITHOUT CARDIOVERSION N/A 03/28/2020   Procedure: TRANSESOPHAGEAL ECHOCARDIOGRAM (TEE);  Surgeon: Buford Dresser,  MD;  Location: [facility];  Service: Cardiovascular;  Laterality: N/A;   THYROIDECTOMY  10/18/2011   Procedure: THYROIDECTOMY WITH RADICAL NECK DISSECTION;  Surgeon: Izora Gala, MD;  Location: [facility];  Service: ENT;  Laterality: Right;     OB History     Gravida  2   Para  0   Term  0   Preterm  0   AB  0   Living  2      SAB  0   IAB  0   Ectopic  0   Multiple      Live Births  2           Family History  Problem Relation Age of Onset   Gait disorder Mother    Cancer Mother  Meningioma   Cancer Father        Pancreatic   Heart failure Sister    Cancer Maternal Aunt        menigeoma    Social History   Tobacco Use   Smoking status: Never   Smokeless tobacco: Never   Tobacco comments:    smoked few years in college  Vaping Use   Vaping Use: Never used  Substance Use Topics   Alcohol use: No   Drug use: No    Home Medications Prior to Admission medications   Medication Sig Start Date End Date Taking? Authorizing Provider  Ascorbic Acid (VITAMIN C) 100 MG tablet Take 100 mg by mouth every evening. Gummie    [provider]  Cholecalciferol (D3 PO) Take 1 capsule by mouth daily.    [provider]  diphenhydrAMINE (BENADRYL) 25 MG tablet Take 25 mg by mouth every 6 (six) hours as needed for allergies.    [provider]  donepezil (ARICEPT) 10 MG tablet TAKE 1 TABLET BY MOUTH ONCE DAILY IN THE EVENING 04/03/21   [name], MD  erlotinib (TARCEVA) 100 MG tablet TAKE 1 TABLET (100 MG TOTAL) BY MOUTH DAILY. TAKE ON AN EMPTY STOMACH 1 HOUR BEFORE MEALS OR 2 HOURS AFTER 02/14/21 02/14/22  Curt Bears, MD  hydrocerin (EUCERIN) CREA Apply 1 application topically 2 (two) times daily. To back and buttocks. 06/13/20   Elodia [place]., MD  loperamide (IMODIUM A-D) 2 MG tablet Take 4 mg by mouth in the morning and at bedtime.    [provider]  MAGNESIUM PO Take 1 tablet by mouth daily.    [provider]  metoprolol succinate (TOPROL-XL) 25 MG 24 hr tablet Take 1 tablet (25 mg total) by mouth daily. 05/24/20   Buford Dresser, MD  Multiple Vitamin (MULTI-VITAMINS) TABS Take 1 tablet by mouth every evening.     [provider]  potassium chloride SA (KLOR-CON) 20 MEQ tablet Take 20 mEq by mouth daily.    [provider]  prochlorperazine (COMPAZINE) 10 MG tablet Take 1 tablet (10 mg total) by mouth every 6 (six) hours as needed for nausea or vomiting. 05/30/20   Curt Bears, MD  vitamin B-12 (CYANOCOBALAMIN) 1000 MCG tablet Take 1,000 mcg by mouth every evening.    [provider]  XARELTO 20 MG TABS tablet Take 1 tablet (20 mg total) by mouth every evening. 01/23/21   [name], MD    Allergies    Patient has no known allergies.  Review of Systems   Review of Systems  Constitutional:  Negative for chills and fever.  HENT:  Negative for ear pain and sore throat.   Eyes:  Negative for pain and visual disturbance.  Respiratory:  Negative for cough and shortness of breath.   Cardiovascular:  Negative for chest pain and palpitations.  Gastrointestinal:  Negative for abdominal pain and vomiting.  Genitourinary:  Positive for dysuria and hematuria.  Musculoskeletal:  Negative for arthralgias and back pain.  Skin:  Negative for color change and rash.  Neurological:  Negative for seizures and syncope.  All other systems reviewed and are negative.  Physical Exam Updated Vital Signs BP 127/72   Pulse 80   Temp 98.2 F (36.8 C) (Oral)   Resp (!) 22   Wt 72.6 kg   SpO2 98%   BMI 24.34 kg/m   Physical Exam Vitals and nursing note reviewed.  Constitutional:      General:  She is not in acute distress.    Appearance: She is well-developed.  HENT:     Head: Normocephalic and atraumatic.  Eyes:     Conjunctiva/sclera: Conjunctivae normal.  Cardiovascular:     Rate and Rhythm: Normal rate and regular rhythm.     Heart sounds: No  murmur heard. Pulmonary:     Effort: Pulmonary effort is normal. No respiratory distress.     Breath sounds: Normal breath sounds.  Abdominal:     Palpations: Abdomen is soft.     Tenderness: There is no abdominal tenderness.  Genitourinary:    Comments: Vaginal lacerations or evidence of vaginal bleeding Musculoskeletal:     Cervical back: Neck supple.  Skin:    General: Skin is warm and dry.  Neurological:     Mental Status: She is alert.    ED Results / Procedures / Treatments   Labs (all labs ordered are listed, but only abnormal results are displayed) Labs Reviewed  CBC WITH DIFFERENTIAL/PLATELET - Abnormal; Notable for the following components:      Result Value   Hemoglobin 11.8 (*)    All other components within normal limits  COMPREHENSIVE METABOLIC PANEL - Abnormal; Notable for the following components:   Chloride 112 (*)    Glucose, Bld 137 (*)    Creatinine, Ser 1.07 (*)    Total Protein 6.4 (*)    AST 14 (*)    GFR, Estimated 54 (*)    All other components within normal limits  URINALYSIS, ROUTINE W REFLEX MICROSCOPIC - Abnormal; Notable for the following components:   Color, Urine AMBER (*)    APPearance CLOUDY (*)    Hgb urine dipstick MODERATE (*)    Protein, ur 100 (*)    Leukocytes,Ua LARGE (*)    RBC / HPF >50 (*)    WBC, UA >50 (*)    Bacteria, UA MANY (*)    All other components within normal limits  CBC - Abnormal; Notable for the following components:   RBC 3.69 (*)    Hemoglobin 10.8 (*)    HCT 34.9 (*)    All other components within normal limits  RESP PANEL BY RT-PCR (FLU A&B, COVID) [id number]  CBC  CBC  BASIC METABOLIC PANEL    EKG None  Radiology CT RENAL STONE STUDY  Result Date: 04/17/2021 CLINICAL DATA:  Flank pain.  Concern for kidney stone. EXAM: CT ABDOMEN AND PELVIS WITHOUT CONTRAST TECHNIQUE: Multidetector CT imaging of the abdomen and pelvis was performed following the standard protocol without IV contrast. COMPARISON:  CT  dated 02/23/2021. FINDINGS: Evaluation of this exam is limited in the absence of intravenous contrast. Lower chest: There is eventration of the left hemidiaphragm. The visualized right lung base is clear. No intra-abdominal free air or free fluid. Hepatobiliary: No focal liver abnormality is seen. No gallstones, gallbladder wall thickening, or biliary dilatation. Pancreas: Choose loss Spleen: Normal in size without focal abnormality. Adrenals/Urinary Tract: The adrenal glands are unremarkable. 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 Nonobstructing stone in the upper pole of the left kidney measures approximately 9 mm. There is a 2 mm nonobstructing left renal inferior pole calculus. There is mild left hydronephrosis and hydroureter. No obstructing stone identified. Several nonobstructing right renal calculi measure up to 5 mm in the inferior pole of the right kidney. There is no hydronephrosis on the right. The right ureter appears unremarkable. There is trabeculated appearance of the bladder wall. Stomach/Bowel: There is no  bowel obstruction or active inflammation. There is a 3 cm diverticulum of the gastric fundus. No active inflammatory changes. The appendix is normal. Vascular/Lymphatic: Mild aortoiliac atherosclerotic disease. The IVC is unremarkable no venous gas. There is no adenopathy. Reproductive: The uterus is retroverted. Calcified uterine fibroid noted. No adnexal masses. Other: None Musculoskeletal: Osteopenia with scoliosis and degenerative changes of the spine. Old L1 and L4 compression fractures. No acute osseous pathology. IMPRESSION: 1. 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 No obstructing stone identified. Correlation with urinalysis recommended to exclude UTI. 2. Bilateral nonobstructing renal calculi. No hydronephrosis on the right. 3. No bowel obstruction. Normal appendix. 4. Aortic Atherosclerosis  (ICD10-I70.0). Electronically Signed   By: Anner Crete M.D.   On: 04/17/2021 22:54    Procedures Procedures   Medications Ordered in ED Medications  meropenem (MERREM) 1 g in sodium chloride 0.9 % 100 mL IVPB (0 g Intravenous Stopped 04/17/21 2320)  lactated ringers infusion ( Intravenous New Bag/Given 04/18/21 0011)  acetaminophen (TYLENOL) tablet 650 mg (has no administration in time range)    Or  acetaminophen (TYLENOL) suppository 650 mg (has no administration in time range)    ED Course  I have reviewed the triage vital signs and the nursing notes.  Pertinent labs & imaging results that were available during my care of the patient were reviewed by me and considered in my medical decision making (see chart for details).    MDM Rules/Calculators/A&P                           Patient seen emergency department for evaluation of dysuria and hematuria.  Physical exam is largely unremarkable with no significant suprapubic tenderness on exam.  Laboratory evaluation largely unremarkable outside of a mild decrease hemoglobin to 11.8.  Urinalysis with greater than 50 red blood cells, greater than 50 white blood cells, large leuk esterase.  Patient previously has grown multiple resistant bugs including ESBL, Enterobacter, MRSA.  She was placed on Zosyn and admitted to medicine for UTI not amenable to oral antibiotics. Final Clinical Impression(s) / ED Diagnoses Final diagnoses:  None    Rx / DC Orders ED Discharge Orders     None        Jakylan Ron, Debe Coder, MD 04/18/21 [phone]

## 2021-04-18 NOTE — Chronic Care Management (AMB) (Signed)
Chronic Care Management   CCM RN Visit Note  04/18/2021 Name: Valerie [name] MRN: [id number] DOB: 07-01-1946  Subjective: Valerie [name] is a 75 y.o. year old female who is a primary care patient of [name], MD. The care management team was consulted for assistance with disease management and care coordination needs.    Chart review  for  preparation to contact patient  in response to provider referral for case management and/or care coordination services.   Consent to Services:  The patient was given information about Chronic Care Management services, agreed to services, and gave verbal consent prior to initiation of services.  Please see initial visit note for detailed documentation.   Patient agreed to services and verbal consent obtained.   Assessment: Review of patient past medical history, allergies, medications, health status, including review of consultants reports, laboratory and other test data, was performed as part of comprehensive evaluation and provision of chronic care management services.   SDOH (Social Determinants of Health) assessments and interventions performed:    CCM Care Plan  No Known Allergies  Facility-Administered Encounter Medications as of 04/18/2021  Medication   acetaminophen (TYLENOL) tablet 650 mg   Or   acetaminophen (TYLENOL) suppository 650 mg   liver oil-zinc oxide (DESITIN) 40 % ointment   meropenem (MERREM) 1 g in sodium chloride 0.9 % 100 mL IVPB   Outpatient Encounter Medications as of 04/18/2021  Medication Sig Note   Ascorbic Acid (VITAMIN C) 100 MG tablet Take 100 mg by mouth every evening. Gummie    cetaphil (CETAPHIL) lotion Apply 1 application topically daily as needed for dry skin.    Cholecalciferol (D3 PO) Take 1 capsule by mouth daily.    diphenhydrAMINE (BENADRYL) 25 MG tablet Take 25 mg by mouth every 6 (six) hours as needed for allergies.    donepezil (ARICEPT) 10 MG tablet TAKE 1 TABLET BY MOUTH ONCE DAILY  IN THE EVENING (Patient taking differently: Take 10 mg by mouth at bedtime.)    erlotinib (TARCEVA) 100 MG tablet TAKE 1 TABLET (100 MG TOTAL) BY MOUTH DAILY. TAKE ON AN EMPTY STOMACH 1 HOUR BEFORE MEALS OR 2 HOURS AFTER (Patient taking differently: Take 100 mg by mouth daily.)    loperamide (IMODIUM A-D) 2 MG tablet Take 4 mg by mouth 3 (three) times daily as needed for diarrhea or loose stools.    MAGNESIUM PO Take 200 mg by mouth daily.    metoprolol succinate (TOPROL-XL) 25 MG 24 hr tablet Take 1 tablet (25 mg total) by mouth daily. (Patient not taking: Reported on 04/18/2021)    Multiple Vitamin (MULTI-VITAMINS) TABS Take 1 tablet by mouth every evening.     potassium chloride SA (KLOR-CON) 20 MEQ tablet Take 20 mEq by mouth daily. (Patient not taking: Reported on 04/18/2021) 06/03/2020: Non-compliance   prochlorperazine (COMPAZINE) 10 MG tablet Take 1 tablet (10 mg total) by mouth every 6 (six) hours as needed for nausea or vomiting.    vitamin B-12 (CYANOCOBALAMIN) 1000 MCG tablet Take 1,000 mcg by mouth every evening.    XARELTO 20 MG TABS tablet Take 1 tablet (20 mg total) by mouth every evening. 04/18/2021: Temp. stopped    Patient Active Problem List   Diagnosis Date Noted   Complicated UTI (urinary tract infection) 04/17/2021   Malnutrition of moderate degree 06/13/2020   Rash and nonspecific skin eruption    Metastatic malignant neoplasm (HCC)    Failure to thrive in adult    Asymptomatic bacteriuria  Pressure injury of skin 06/03/2020   Cellulitis 06/03/2020   SVT (supraventricular tachycardia) ([place]) 03/26/2020   Normocytic anemia 03/24/2020   Urinary tract infection without hematuria    AMS (altered mental status) 03/23/2020   Hypokalemia 03/23/2020   Prolonged Q-T interval on ECG 03/23/2020   DNR (do not resuscitate) 03/23/2020   MRSA bacteremia 03/23/2020   Acute combined systolic and diastolic heart failure (HCC)    Elevated troponin    Dilated cardiomyopathy ([place])     Coronary artery disease involving native coronary artery of native heart without angina pectoris    Congestive heart failure (CHF) ([place]) 02/14/2020   Pyelonephritis 09/10/2019   Acute blood loss anemia 06/12/2019   Closed fracture of distal end of right femur, initial encounter ([place]) 06/11/2019   Closed fracture of lateral portion of right tibial plateau 06/11/2019   History of DVT of lower extremity 06/11/2019   Blurry vision 01/06/2019   Headache 07/24/2016   Cough 08/24/2015   Chronic diarrhea 08/24/2015   Sepsis ([place])    Encounter for antineoplastic chemotherapy 01/31/2015   Urinary retention 10/20/2014   Ileus ([place]) 10/20/2014   Cellulitis of leg, right 10/20/2014   Fracture of right proximal fibula    Ankle fracture 10/02/2014   Ankle fracture 10/02/2014   Lung cancer ([place]) 10/02/2014   Closed fibular fracture 10/02/2014   Dementia ([place]) 10/02/2014   Lower urinary tract infectious disease 10/02/2014   Reactive airway disease 10/02/2014   Fall 10/02/2014   Closed left ankle fracture 10/01/2014   Right fibular fracture 10/01/2014   Lung metastasis ([place])    LESION, ANUS 04/26/2009   DIARRHEA 04/26/2009   Primary cancer of right upper lobe of lung ([place]) 04/22/2009   Malignant neoplasm of brain ([place]) 04/22/2009   VITAMIN D DEFICIENCY 04/22/2009   ANAL FISTULA 04/22/2009   OSTEOPOROSIS 04/22/2009    Conditions to be addressed/monitored: Malignant Neoplasm of upper lobe of right lung; Dementia w/o behavior disturbance, unspecified dementia type, Gait abnormality, Debility, Recurrent UTI  Care Plan : Complicated Urinary Tract Infection  Updates made by Lynne Logan, RN since 04/18/2021 12:00 AM     Problem: Complicated Urinary Tract Infection   Priority: High     Goal: UTI evaluated and treated   Start Date: 04/18/2021  Expected End Date: 05/19/2021  This Visit's Progress: On track  Priority: High  Note:   Current Barriers:  Ineffective Self Health Maintenance  in a patient with Malignant Neoplasm of upper lobe of right lung; Dementia w/o behavior disturbance, unspecified dementia type, Gait abnormality, Debility, Recurrent UTI Dementia  Clinical Goal(s):  Collaboration with [name], MD regarding development and update of comprehensive plan of care as evidenced by provider attestation and co-signature Inter-disciplinary care team collaboration (see longitudinal plan of care) patient will work with care management team to address care coordination and chronic disease management needs related to Disease Management Educational Needs Care Coordination Medication Management and Education Medication Reconciliation Psychosocial Support   Interventions:  Evaluation of current treatment plan related to Malignant Neoplasm of upper lobe of right lung; Dementia w/o behavior disturbance, unspecified dementia type, Gait abnormality, Debility, Recurrent UTI, self-management and patient's adherence to plan as established by provider. Collaboration with [name], MD regarding development and update of comprehensive plan of care as evidenced by provider attestation       and co-signature Inter-disciplinary care team collaboration (see longitudinal plan of care) Reviewed the following inpatient admission dated 04/17/21, [facility], dx: Complicated UTI: Assessment/Plan Principal Problem:  Complicated UTI (urinary tract infection) Active Problems:   Primary cancer of right upper lobe of lung (HCC)   Dementia (HCC)   History of DVT of lower extremity   DNR (do not resuscitate) Complicated UTI and hematuria with history of ESBL presently on meropenem.  Follow urine cultures.  Since patient is complaining of left lower quadrant pain I have ordered a CT renal study to make sure there is no obstructive uropathy.  Closely follow CBC.  If persist may need urology input. History of DVT and A. fib on Xarelto which patient has stopped taking for a week now. Anemia  appears to be chronic.  However since patient has which area closely follow CBC. History of systolic heart failure secondary to [place].  Appears compensated.  Closely monitor.  Last EF was around 25 to 30%. Non-small cell lung cancer being followed by Dr. Julien Nordmann oncologist.  On Tarceva. History of dementia per the chart. Self Care Activities with the help of her spouse  Self administers medications as prescribed Attends all scheduled provider appointments Calls pharmacy for medication refills Calls provider office for new concerns or questions Patient Goals: - follow MD recommendations for complicated UTI  Follow Up Plan: Telephone follow up appointment with care management team member scheduled for: 04/26/21    Plan:Telephone follow up appointment with care management team member scheduled for:  04/26/21  Barb Merino, RN, BSN, CCM Care Management Coordinator [facility]  Direct Phone: [phone]

## 2021-04-18 NOTE — Plan of Care (Signed)
  Problem: Education: Goal: Knowledge of General Education information will improve Description: Including pain rating scale, medication(s)/side effects and non-pharmacologic comfort measures Outcome: Progressing   Problem: Clinical Measurements: Goal: Will remain free from infection Outcome: Progressing   Problem: Activity: Goal: Risk for activity intolerance will decrease Outcome: Progressing   Problem: Nutrition: Goal: Adequate nutrition will be maintained Outcome: Progressing   Problem: Elimination: Goal: Will not experience complications related to urinary retention Outcome: Progressing

## 2021-04-19 ENCOUNTER — Other Ambulatory Visit (HOSPITAL_COMMUNITY): Payer: Self-pay

## 2021-04-19 DIAGNOSIS — D630 Anemia in neoplastic disease: Secondary | ICD-10-CM | POA: Diagnosis present

## 2021-04-19 DIAGNOSIS — D6481 Anemia due to antineoplastic chemotherapy: Secondary | ICD-10-CM | POA: Diagnosis present

## 2021-04-19 DIAGNOSIS — Z86718 Personal history of other venous thrombosis and embolism: Secondary | ICD-10-CM | POA: Diagnosis not present

## 2021-04-19 DIAGNOSIS — Z8619 Personal history of other infectious and parasitic diseases: Secondary | ICD-10-CM | POA: Diagnosis not present

## 2021-04-19 DIAGNOSIS — I5022 Chronic systolic (congestive) heart failure: Secondary | ICD-10-CM | POA: Diagnosis present

## 2021-04-19 DIAGNOSIS — Z85850 Personal history of malignant neoplasm of thyroid: Secondary | ICD-10-CM | POA: Diagnosis not present

## 2021-04-19 DIAGNOSIS — E559 Vitamin D deficiency, unspecified: Secondary | ICD-10-CM | POA: Diagnosis present

## 2021-04-19 DIAGNOSIS — I4891 Unspecified atrial fibrillation: Secondary | ICD-10-CM | POA: Diagnosis present

## 2021-04-19 DIAGNOSIS — N390 Urinary tract infection, site not specified: Secondary | ICD-10-CM | POA: Diagnosis present

## 2021-04-19 DIAGNOSIS — Z20822 Contact with and (suspected) exposure to covid-19: Secondary | ICD-10-CM | POA: Diagnosis present

## 2021-04-19 DIAGNOSIS — F0390 Unspecified dementia without behavioral disturbance: Secondary | ICD-10-CM | POA: Diagnosis present

## 2021-04-19 DIAGNOSIS — T451X5A Adverse effect of antineoplastic and immunosuppressive drugs, initial encounter: Secondary | ICD-10-CM | POA: Diagnosis present

## 2021-04-19 DIAGNOSIS — M810 Age-related osteoporosis without current pathological fracture: Secondary | ICD-10-CM | POA: Diagnosis present

## 2021-04-19 DIAGNOSIS — E890 Postprocedural hypothyroidism: Secondary | ICD-10-CM | POA: Diagnosis present

## 2021-04-19 DIAGNOSIS — Z79899 Other long term (current) drug therapy: Secondary | ICD-10-CM | POA: Diagnosis not present

## 2021-04-19 DIAGNOSIS — C3411 Malignant neoplasm of upper lobe, right bronchus or lung: Secondary | ICD-10-CM | POA: Diagnosis present

## 2021-04-19 DIAGNOSIS — Z66 Do not resuscitate: Secondary | ICD-10-CM | POA: Diagnosis present

## 2021-04-19 DIAGNOSIS — Z923 Personal history of irradiation: Secondary | ICD-10-CM | POA: Diagnosis not present

## 2021-04-19 DIAGNOSIS — Z7901 Long term (current) use of anticoagulants: Secondary | ICD-10-CM | POA: Diagnosis not present

## 2021-04-19 DIAGNOSIS — I110 Hypertensive heart disease with heart failure: Secondary | ICD-10-CM | POA: Diagnosis present

## 2021-04-19 DIAGNOSIS — M1990 Unspecified osteoarthritis, unspecified site: Secondary | ICD-10-CM | POA: Diagnosis present

## 2021-04-19 DIAGNOSIS — C779 Secondary and unspecified malignant neoplasm of lymph node, unspecified: Secondary | ICD-10-CM | POA: Diagnosis present

## 2021-04-19 DIAGNOSIS — Z9221 Personal history of antineoplastic chemotherapy: Secondary | ICD-10-CM | POA: Diagnosis not present

## 2021-04-19 DIAGNOSIS — N136 Pyonephrosis: Secondary | ICD-10-CM | POA: Diagnosis present

## 2021-04-19 DIAGNOSIS — C7931 Secondary malignant neoplasm of brain: Secondary | ICD-10-CM | POA: Diagnosis present

## 2021-04-19 LAB — BASIC METABOLIC PANEL
Anion gap: 7 (ref 5–15)
BUN: 14 mg/dL (ref 8–23)
CO2: 22 mmol/L (ref 22–32)
Calcium: 9 mg/dL (ref 8.9–10.3)
Chloride: 111 mmol/L (ref 98–111)
Creatinine, Ser: 0.71 mg/dL (ref 0.44–1.00)
GFR, Estimated: >60 mL/min (ref >60)
Glucose, Bld: 94 mg/dL (ref 70–99)
Potassium: 3.5 mmol/L (ref 3.5–5.1)
Sodium: 140 mmol/L (ref 135–145)

## 2021-04-19 LAB — CBC
HCT: 32.1 % — ABNORMAL LOW (ref 36.0–46.0)
Hemoglobin: 10.2 g/dL — ABNORMAL LOW (ref 12.0–15.0)
MCH: 29.2 pg (ref 26.0–34.0)
MCHC: 31.8 g/dL (ref 30.0–36.0)
MCV: 92 fL (ref 80.0–100.0)
Platelets: 270 10*3/uL (ref 150–400)
RBC: 3.49 MIL/uL — ABNORMAL LOW (ref 3.87–5.11)
RDW: 14.9 % (ref 11.5–15.5)
WBC: 5.8 10*3/uL (ref 4.0–10.5)
nRBC: 0 % (ref 0.0–0.2)

## 2021-04-19 LAB — GLUCOSE, CAPILLARY: Glucose-Capillary: 101 mg/dL — ABNORMAL HIGH (ref 70–99)

## 2021-04-19 LAB — MRSA NEXT GEN BY PCR, NASAL: MRSA by PCR Next Gen: DETECTED — AB

## 2021-04-19 MED ORDER — SULFAMETHOXAZOLE-TRIMETHOPRIM 800-160 MG PO TABS
1.0000 | ORAL_TABLET | Freq: Two times a day (BID) | ORAL | Status: DC
  Administered 2021-04-19 – 2021-04-20 (×3): 1 via ORAL
  Filled 2021-04-19 (×3): qty 1

## 2021-04-19 MED ORDER — CHLORHEXIDINE GLUCONATE CLOTH 2 % EX PADS
6.0000 | MEDICATED_PAD | Freq: Every day | CUTANEOUS | Status: DC
  Administered 2021-04-20 – 2021-04-23 (×4): 6 via TOPICAL

## 2021-04-19 MED ORDER — MUPIROCIN 2 % EX OINT
1.0000 | TOPICAL_OINTMENT | Freq: Two times a day (BID) | CUTANEOUS | Status: DC
Start: 2021-04-19 — End: 2021-04-23
  Administered 2021-04-19 – 2021-04-23 (×8): 1 via NASAL
  Filled 2021-04-19 (×4): qty 22

## 2021-04-19 MED ORDER — SODIUM CHLORIDE 0.9 % IV SOLN
1.0000 g | Freq: Three times a day (TID) | INTRAVENOUS | Status: DC
  Filled 2021-04-19: qty 1

## 2021-04-19 NOTE — Progress Notes (Addendum)
Pharmacy Antibiotic Note  Valerie [name] is a 75 y.o. female admitted on 04/17/2021 with UTI - hx prior ESBL. Pharmacy originally consulted for Meropenem dosing however now with plans to transition to Bactrim per ID recommendations - total duration of 10 days.   Plan: - D/c Meropenem - Start Bactrim DS 1 tab bid  - LOT 10d - stop date added for 9/8 - Will continue to follow renal function and culture results  Height: 5\' 7"  (170.2 cm) Weight: 72.6 kg (160 lb 0.7 oz) IBW/kg (Calculated) : 61.6  Temp (24hrs), Avg:98.1 F (36.7 C), Min:98 F (36.7 C), Max:98.2 F (36.8 C)  Recent Labs  Lab 04/17/21 1544 04/18/21 0011 04/18/21 0453 04/18/21 1007 04/19/21 0352  WBC 9.2 8.1  --  6.4 5.8  CREATININE 1.07*  --  0.91  --  0.71     Estimated Creatinine Clearance: 59.1 mL/min (by C-G formula based on SCr of 0.71 mg/dL).    No Known Allergies  Antimicrobials this admission: Mero 8/29 >> 8/31 Bactrim 8/31 >> (9/8)  Thank you for allowing pharmacy to be a part of this patient's care.  Alycia Rossetti, PharmD, BCPS Clinical Pharmacist Clinical phone for 04/19/2021: [phone] 04/19/2021 2:10 PM   **Pharmacist phone directory can now be found on [email or web address] ([email or web address]).  Listed under [place].

## 2021-04-19 NOTE — Progress Notes (Signed)
Physical Therapy Evaluation Patient Details Name: Valerie [name] MRN: [id number] DOB: 1946/02/07 Today's Date: 04/19/2021   History of Present Illness  Pt is a 75yo female presenting to [facility] ED on 8/29 complaining of bleeding and generalized weakness, likely secondary to comlpicated UTI. 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 PMH: lung cancer currently on chemo (tarceva), CHF, afib, DVT, anxiety, dementia, HTN, anemia.   Clinical Impression  Pt presents with the impairments above and problems below. Pt required min assist with HOB elevated for bed mobility; mod to max assist +2 for transfer to the recliner.  Pt's family requesting to return home with HHPT; able to provide necessary assist. Recommending new hospital bed as pt's is broken; family also requesting new sliding board. We will continue to follow her acutely to promote independence with functional mobility.    Follow Up Recommendations Home health PT;Supervision/Assistance - 24 hour (Pt's family very motivated to have her return home.)    [place] [facility] bed;Other (comment) (Sliding board.)    Recommendations for Other Services       Precautions / Restrictions Precautions Precautions: Fall Restrictions Weight Bearing Restrictions: No      Mobility  Bed Mobility Overal bed mobility: Needs Assistance Bed Mobility: Supine to Sit     Supine to sit: Min assist;HOB elevated     General bed mobility comments: Pt required min assist for trunk control to acheive upright sitting; required mod assist to remain upright secondary to posterior lean.    Transfers Overall transfer level: Needs assistance Equipment used: Rolling walker (2 wheeled) Transfers: Sit to/from [facility] Sit to Stand: Mod assist;+2 physical assistance;Max assist Stand pivot transfers: Mod assist;Max assist;+2 physical assistance        General transfer comment: Pt required mod to max assist +2 for lift assist during sit to stand transfer with max verbal cuing to stand up tall; PT utilized tactile cuing for tucking pelvis. Pt required mod to max assist +2 for stand pivot transfer with max verbal cuing for lifting feet and stepping. Pt highly fearful during transfer and required max encouragement to stand up.  Ambulation/Gait                Stairs            Wheelchair Mobility    Modified Rankin (Stroke Patients Only)       Balance Overall balance assessment: Needs assistance Sitting-balance support: Feet supported;No upper extremity supported Sitting balance-Leahy Scale: Poor Sitting balance - Comments: Pt required posterior support at mod assist level to remain upright secondary to posterior lean Postural control: Posterior lean Standing balance support: Bilateral upper extremity supported;During functional activity Standing balance-Leahy Scale: Zero Standing balance comment: Pt unable to maintain static balance without max assist external support.                             Pertinent Vitals/Pain Pain Assessment: No/denies pain    Home Living Family/patient expects to be discharged to:: Private residence Living Arrangements: Spouse/significant other Available Help at Discharge: Available 24 hours/day;Family Type of Home: House Home Access: Ramped entrance     Home Layout: Able to live on main level with bedroom/bathroom;Two level;Full bath on main level Home Equipment: Other (comment);Shower seat;Wheelchair - Scientist, product/process development - 4 wheels;Toilet riser Optician, dispensing) Additional Comments: Rollator brakes are not functioning.    Prior Function Level of Independence: Needs assistance  Gait / Transfers Assistance Needed: Non-ambulatory; reports was working with therapy on standing but uses hoyer with family.  ADL's / Homemaking Assistance Needed: Has assist with ADLs-does bed  baths during the week, on weekends reports children help to get in shower        Hand Dominance   Dominant Hand: Right    Extremity/Trunk Assessment   Upper Extremity Assessment Upper Extremity Assessment: Overall WFL for tasks assessed    Lower Extremity Assessment Lower Extremity Assessment: Generalized weakness;RLE deficits/detail;LLE deficits/detail RLE Deficits / Details: ROM WFL, pt unable to fully weightbear. LLE Deficits / Details: ROM WFL, pt unable to fully weightbear    Cervical / Trunk Assessment Cervical / Trunk Assessment: Kyphotic  Communication   Communication: No difficulties  Cognition Arousal/Alertness: Awake/alert Behavior During Therapy: WFL for tasks assessed/performed Overall Cognitive Status: History of cognitive impairments - at baseline                                 General Comments: Pt has history of dementia, sister verified home environment.      General Comments      Exercises     Assessment/Plan    PT Assessment Patient needs continued PT services  PT Problem List Decreased strength;Decreased range of motion;Decreased activity tolerance;Decreased mobility;Decreased balance;Decreased coordination;Decreased cognition;Decreased knowledge of use of DME;Decreased safety awareness       PT Treatment Interventions DME instruction;Functional mobility training;Therapeutic activities;Therapeutic exercise;Balance training;Neuromuscular re-education;Cognitive remediation;Patient/family education    PT Goals (Current goals can be found in the Care Plan section)  Acute Rehab PT Goals Patient Stated Goal: To improve her health PT Goal Formulation: With patient/family Time For Goal Achievement: 05/03/21 Potential to Achieve Goals: Good    Frequency Min 3X/week   Barriers to discharge        Co-evaluation               AM-PAC PT "6 Clicks" Mobility  Outcome Measure Help needed turning from your back to your side  while in a flat bed without using bedrails?: A Little Help needed moving from lying on your back to sitting on the side of a flat bed without using bedrails?: A Lot Help needed moving to and from a bed to a chair (including a wheelchair)?: Total Help needed standing up from a chair using your arms (e.g., wheelchair or bedside chair)?: Total Help needed to walk in hospital room?: Total Help needed climbing 3-5 steps with a railing? : Total 6 Click Score: 9    End of Session Equipment Utilized During Treatment: Gait belt Activity Tolerance: Patient tolerated treatment well Patient left: in chair;with call bell/phone within reach;with chair alarm set;with family/visitor present (Sister) Nurse Communication: Mobility status;Need for lift equipment (Utilization of [place] +2 for transfer) PT Visit Diagnosis: Muscle weakness (generalized) (M62.81);Unsteadiness on feet (R26.81);Difficulty in walking, not elsewhere classified (R26.2)    Time: 6283-1517 PT Time Calculation (min) (ACUTE ONLY): 28 min   Charges:              Dawayne Cirri, SPT Dawayne Cirri 04/19/2021, 3:39 PM

## 2021-04-19 NOTE — Plan of Care (Signed)
  Problem: Education: Goal: Knowledge of General Education information will improve Description Including pain rating scale, medication(s)/side effects and non-pharmacologic comfort measures Outcome: Progressing   

## 2021-04-19 NOTE — Progress Notes (Signed)
As per ID nurse no isolation needed, d/c isolation order.

## 2021-04-19 NOTE — Care Management Obs Status (Signed)
[facility] NOTIFICATION   Patient Details  Name: Valerie [name] MRN: [id number] Date of Birth: 08-May-1946   Medicare Observation Status Notification Given:  Yes    Tom-Johnson, Renea Ee, RN 04/19/2021, 9:02 AM

## 2021-04-19 NOTE — Progress Notes (Signed)
PROGRESS NOTE    Valerie [name]  IZT:[id number] DOB: [birth year] DOA: 04/17/2021 PCP: [name], MD   Brief Narrative: Valerie [name] is a 75 y.o. 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  Patient presented secondary to hematuria in setting of acute UTI and in setting of Xarelto use.  Patient started empirically on meropenem IV on admission.   Assessment & Plan:   Principal Problem:   Complicated UTI (urinary tract infection) Active Problems:   Primary cancer of right upper lobe of lung (HCC)   Dementia (HCC)   History of DVT of lower extremity   DNR (do not resuscitate)   UTI with gross hematuria Patient with a prior history of E. Coli and enterobacter cloacae in the past. Started epically on meropenem on admission. No urine culture obtained. Hematuria appears to have improved. -Will discuss with ID for help in antibiotic regimen/duration -Urine culture, although s/p multiple doses of meropenem  History of DVT Patient is on Xarelto as an outpatient which is held secondary to above.  Atrial fibrillation On Xarelto which is held secondary to gross hematuria. Rate controlled.  Chronic anemia Baseline appears to be around 8 but has been as high as 12 more recently. Hemoglobin of around 11 on admission and down-trended slightly -CBC in AM  HFrEF Chronic and secondary to treatment with Tagrisso. No acute symptoms. EF of 25-30% from 2021. Patient is on Toprol XL as an outpatient although patient states she is no longer taking.  Non-small cell lung cancer Patient follows with Dr. Julien Nordmann. Currently on Tarceva.  Dementia Stable. -Continue Aricept -Delirium precautions   DVT prophylaxis: SCDs Code Status:   Code Status: DNR Family Communication: None at bedside Disposition Plan: Discharge home likely in 1-2 days pending antibiotic recommendations,  PT recommendations   Consultants:  Infectious disease  Procedures:  None  Antimicrobials: Meropenem IV   Subjective: Hematuria improved.  Objective: Vitals:   04/18/21 0904 04/18/21 2107 04/19/21 0513 04/19/21 1020  BP: (!) 114/97 (!) 118/57 (!) 124/59 128/63  Pulse: 95 73 73 89  Resp: 17 17 18 18   Temp: 98.2 F (36.8 C) 98.2 F (36.8 C) 98.1 F (36.7 C) 98 F (36.7 C)  TempSrc:  Oral Oral Oral  SpO2: 99% 100% 100% 99%  Weight:  72.6 kg    Height: 5\' 7"  (1.702 m)       Intake/Output Summary (Last 24 hours) at 04/19/2021 1355 Last data filed at 04/19/2021 0513 Gross per 24 hour  Intake 1308.88 ml  Output 600 ml  Net 708.88 ml   Filed Weights   04/17/21 2130 04/18/21 2107  Weight: 72.6 kg 72.6 kg    Examination:  General exam: Appears calm and comfortable Respiratory system: Clear to auscultation. Respiratory effort normal. Cardiovascular system: S1 & S2 heard, RRR. No murmurs, rubs, gallops or clicks. Gastrointestinal system: Abdomen is nondistended, soft and nontender. No organomegaly or masses felt. Normal bowel sounds heard. Central nervous system: Alert and oriented. No focal neurological deficits. Musculoskeletal: No edema. No calf tenderness Skin: No cyanosis. No rashes Psychiatry: Judgement and insight appear normal. Mood & affect appropriate.     Data Reviewed: I have personally reviewed following labs and imaging studies  CBC Lab Results  Component Value Date   WBC 5.8 04/19/2021   RBC 3.49 (L) 04/19/2021   HGB 10.2 (L) 04/19/2021   HCT 32.1 (L) 04/19/2021   MCV 92.0  04/19/2021   MCH 29.2 04/19/2021   PLT 270 04/19/2021   MCHC 31.8 04/19/2021   RDW 14.9 04/19/2021   LYMPHSABS 1.2 04/17/2021   MONOABS 0.5 04/17/2021   EOSABS 0.1 04/17/2021   BASOSABS 0.0 40/98/1191     Last metabolic panel Lab Results  Component Value Date   NA 140 04/19/2021   K 3.5 04/19/2021   CL 111 04/19/2021   CO2 22 04/19/2021   BUN 14 04/19/2021    CREATININE 0.71 04/19/2021   GLUCOSE 94 04/19/2021   GFRNONAA >60 04/19/2021   GFRAA >60 04/26/2020   CALCIUM 9.0 04/19/2021   PHOS 3.8 06/13/2020   PROT 6.4 (L) 04/17/2021   ALBUMIN 3.6 04/17/2021   LABGLOB 2.3 10/16/2018   AGRATIO 2.2 10/16/2018   BILITOT 0.6 04/17/2021   ALKPHOS 77 04/17/2021   AST 14 (L) 04/17/2021   ALT 10 04/17/2021   ANIONGAP 7 04/19/2021    CBG (last 3)  Recent Labs    04/19/21 1138  GLUCAP 101*     GFR: Estimated Creatinine Clearance: 59.1 mL/min (by C-G formula based on SCr of 0.71 mg/dL).  Coagulation Profile: No results for input(s): INR, PROTIME in the last 168 hours.  Recent Results (from the past 240 hour(s))  Resp Panel by RT-PCR (Flu A&B, Covid) Nasopharyngeal Swab     Status: None   Collection Time: 04/17/21  3:52 PM   Specimen: Nasopharyngeal Swab; Nasopharyngeal(NP) swabs in vial transport medium  Result Value Ref Range Status   SARS Coronavirus 2 by RT PCR NEGATIVE NEGATIVE Final    Comment: (NOTE) SARS-CoV-2 target nucleic acids are NOT DETECTED.  The SARS-CoV-2 RNA is generally detectable in upper respiratory specimens during the acute phase of infection. The lowest concentration of SARS-CoV-2 viral copies this assay can detect is 138 copies/mL. A negative result does not preclude SARS-Cov-2 infection and should not be used as the sole basis for treatment or other patient management decisions. A negative result may occur with  improper specimen collection/handling, submission of specimen other than nasopharyngeal swab, presence of viral mutation(s) within the areas targeted by this assay, and inadequate number of viral copies(<138 copies/mL). A negative result must be combined with clinical observations, patient history, and epidemiological information. The expected result is Negative.  Fact Sheet for Patients:  EntrepreneurPulse.com.au  Fact Sheet for Healthcare Providers:   IncredibleEmployment.be  This test is no t yet approved or cleared by the [place] FDA and  has been authorized for detection and/or diagnosis of SARS-CoV-2 by FDA under an Emergency Use Authorization (EUA). This EUA will remain  in effect (meaning this test can be used) for the duration of the COVID-19 declaration under Section 564(b)(1) of the Act, 21 U.S.C.section 360bbb-3(b)(1), unless the authorization is terminated  or revoked sooner.       Influenza A by PCR NEGATIVE NEGATIVE Final   Influenza B by PCR NEGATIVE NEGATIVE Final    Comment: (NOTE) The Xpert Xpress SARS-CoV-2/FLU/RSV plus assay is intended as an aid in the diagnosis of influenza from Nasopharyngeal swab specimens and should not be used as a sole basis for treatment. Nasal washings and aspirates are unacceptable for Xpert Xpress SARS-CoV-2/FLU/RSV testing.  Fact Sheet for Patients: EntrepreneurPulse.com.au  Fact Sheet for Healthcare Providers: IncredibleEmployment.be  This test is not yet approved or cleared by the [place] FDA and has been authorized for detection and/or diagnosis of SARS-CoV-2 by FDA under an Emergency Use Authorization (EUA). This EUA will remain in effect (meaning this test can  be used) for the duration of the COVID-19 declaration under Section 564(b)(1) of the Act, 21 U.S.C. section 360bbb-3(b)(1), unless the authorization is terminated or revoked.  Performed at [facility], [place] [address], [place], [place] [address]   MRSA Next Gen by PCR, Nasal     Status: Abnormal   Collection Time: 04/19/21 10:09 AM   Specimen: Nasal Mucosa; Nasal Swab  Result Value Ref Range Status   MRSA by PCR Next Gen DETECTED (A) NOT DETECTED Final    Comment: RESULT CALLED TO, READ BACK BY AND VERIFIED WITH: J. NARAMBAS RN, AT 1204 04/19/21 D. VANHOOK (NOTE) The GeneXpert MRSA Assay (FDA approved for NASAL specimens only), is one  component of a comprehensive MRSA colonization surveillance program. It is not intended to diagnose MRSA infection nor to guide or monitor treatment for MRSA infections. Test performance is not FDA approved in patients less than 31 years old. Performed at [facility], [place] [address] [place], [place] [address]         Radiology Studies: CT RENAL STONE STUDY  Result Date: 04/17/2021 CLINICAL DATA:  Flank pain.  Concern for kidney stone. EXAM: CT ABDOMEN AND PELVIS WITHOUT CONTRAST TECHNIQUE: Multidetector CT imaging of the abdomen and pelvis was performed following the standard protocol without IV contrast. COMPARISON:  CT dated 02/23/2021. FINDINGS: Evaluation of this exam is limited in the absence of intravenous contrast. Lower chest: There is eventration of the left hemidiaphragm. The visualized right lung base is clear. No intra-abdominal free air or free fluid. Hepatobiliary: No focal liver abnormality is seen. No gallstones, gallbladder wall thickening, or biliary dilatation. Pancreas: Choose loss Spleen: Normal in size without focal abnormality. Adrenals/Urinary Tract: The adrenal glands are unremarkable. 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 Nonobstructing stone in the upper pole of the left kidney measures approximately 9 mm. There is a 2 mm nonobstructing left renal inferior pole calculus. There is mild left hydronephrosis and hydroureter. No obstructing stone identified. Several nonobstructing right renal calculi measure up to 5 mm in the inferior pole of the right kidney. There is no hydronephrosis on the right. The right ureter appears unremarkable. There is trabeculated appearance of the bladder wall. Stomach/Bowel: There is no bowel obstruction or active inflammation. There is a 3 cm diverticulum of the gastric fundus. No active inflammatory changes. The appendix is normal. Vascular/Lymphatic: Mild aortoiliac atherosclerotic disease. The IVC is  unremarkable no venous gas. There is no adenopathy. Reproductive: The uterus is retroverted. Calcified uterine fibroid noted. No adnexal masses. Other: None Musculoskeletal: Osteopenia with scoliosis and degenerative changes of the spine. Old L1 and L4 compression fractures. No acute osseous pathology. IMPRESSION: 1. 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 No obstructing stone identified. Correlation with urinalysis recommended to exclude UTI. 2. Bilateral nonobstructing renal calculi. No hydronephrosis on the right. 3. No bowel obstruction. Normal appendix. 4. Aortic Atherosclerosis (ICD10-I70.0). Electronically Signed   By: Anner Crete M.D.   On: 04/17/2021 22:54        Scheduled Meds:  [START ON 04/20/2021] Chlorhexidine Gluconate Cloth  6 each Topical Q0600   donepezil  10 mg Oral QPM   mupirocin ointment  1 application Nasal BID   Continuous Infusions:  meropenem (MERREM) IV 1 g (04/19/21 0921)     LOS: 0 days     Cordelia Poche, MD [facility] 04/19/2021, 1:55 PM  If 7PM-7AM, please contact night-coverage [email or web address]

## 2021-04-20 ENCOUNTER — Other Ambulatory Visit (HOSPITAL_COMMUNITY): Payer: Self-pay

## 2021-04-20 LAB — CBC
HCT: 33.6 % — ABNORMAL LOW (ref 36.0–46.0)
Hemoglobin: 10.5 g/dL — ABNORMAL LOW (ref 12.0–15.0)
MCH: 28.8 pg (ref 26.0–34.0)
MCHC: 31.3 g/dL (ref 30.0–36.0)
MCV: 92.1 fL (ref 80.0–100.0)
Platelets: 281 10*3/uL (ref 150–400)
RBC: 3.65 MIL/uL — ABNORMAL LOW (ref 3.87–5.11)
RDW: 14.9 % (ref 11.5–15.5)
WBC: 6.4 10*3/uL (ref 4.0–10.5)
nRBC: 0 % (ref 0.0–0.2)

## 2021-04-20 LAB — URINE CULTURE: Culture: <10000 — AB

## 2021-04-20 MED ORDER — RIVAROXABAN 10 MG PO TABS
20.0000 mg | ORAL_TABLET | Freq: Every day | ORAL | Status: DC
  Administered 2021-04-20: 20 mg via ORAL
  Filled 2021-04-20: qty 2

## 2021-04-20 NOTE — Progress Notes (Signed)
PROGRESS NOTE    Valerie [name]  IOX:[id number] DOB: 1945/10/25 DOA: 04/17/2021 PCP: [name], MD   Brief Narrative: Valerie [name] is a 75 y.o. 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  Patient presented secondary to hematuria in setting of acute UTI and in setting of Xarelto use.  Patient started empirically on meropenem IV on admission.   Assessment & Plan:   UTI with gross hematuria Patient with a prior history of ESBL infections, Enterobacter etc.  -Started on IV meropenem on admission, and was subsequently changed to Bactrim yesterday, urine cultures are pending, unfortunately not collected at the time of admission  -Ambulate, PT OT  History of DVT and A. Fib -DVT not in the last year, Dopplers from 10/21 were negative -Resume A. fib dose Xarelto, no further hematuria reported  Atrial fibrillation -Restart Xarelto  Chronic anemia Baseline appears to be around 8 but has been as high as 12 more recently. -Continue to monitor  Chronic systolic CHF -presumed to be related to prior treatment with Tagrisso.  -Clinically appears euvolemic  -EF of 25-30% from 2021. Patient is on Toprol XL as an outpatient although patient states she is no longer taking.  Non-small cell lung cancer Patient follows with Dr. Julien Nordmann. Currently on Tarceva.  Dementia Stable. -Continue Aricept -Delirium precautions   DVT prophylaxis: Restart Xarelto Code Status:   Code Status: DNR Family Communication: None at bedside Disposition Plan: Home likely tomorrow   Consultants:  Infectious disease  Procedures:  None  Antimicrobials: Meropenem IV-changed to Bactrim   Subjective: -Feels better overall, hematuria has improved  Objective: Vitals:   04/19/21 1020 04/19/21 2016 04/20/21 0426 04/20/21 0438  BP: 128/63 121/68 125/72 127/64  Pulse: 89 90 74 68   Resp: 18 16  19   Temp: 98 F (36.7 C) 98.3 F (36.8 C)  97.8 F (36.6 C)  TempSrc: Oral Oral  Oral  SpO2: 99% 99% 99% 100%  Weight:    69.7 kg  Height:        Intake/Output Summary (Last 24 hours) at 04/20/2021 1155 Last data filed at 04/20/2021 0439 Gross per 24 hour  Intake --  Output 300 ml  Net -300 ml   Filed Weights   04/17/21 2130 04/18/21 2107 04/20/21 0438  Weight: 72.6 kg 72.6 kg 69.7 kg    Examination:  General exam: Pleasant thinly built female sitting up in bed, AAOx3, no distress CVS: S1-S2, regular rhythm, loud systolic ejection murmur Lungs: Decreased breath sounds bases otherwise clear Abdomen: Soft, nontender bowel sounds present Extremities: No edema Skin: No rashes on exposed skin Psych: Appropriate mood and affect   Data Reviewed: I have personally reviewed following labs and imaging studies  CBC Lab Results  Component Value Date   WBC 6.4 04/20/2021   RBC 3.65 (L) 04/20/2021   HGB 10.5 (L) 04/20/2021   HCT 33.6 (L) 04/20/2021   MCV 92.1 04/20/2021   MCH 28.8 04/20/2021   PLT 281 04/20/2021   MCHC 31.3 04/20/2021   RDW 14.9 04/20/2021   LYMPHSABS 1.2 04/17/2021   MONOABS 0.5 04/17/2021   EOSABS 0.1 04/17/2021   BASOSABS 0.0 26/83/4196     Last metabolic panel Lab Results  Component Value Date   NA 140 04/19/2021   K 3.5 04/19/2021   CL 111 04/19/2021   CO2 22 04/19/2021   BUN 14 04/19/2021   CREATININE 0.71 04/19/2021  GLUCOSE 94 04/19/2021   GFRNONAA >60 04/19/2021   GFRAA >60 04/26/2020   CALCIUM 9.0 04/19/2021   PHOS 3.8 06/13/2020   PROT 6.4 (L) 04/17/2021   ALBUMIN 3.6 04/17/2021   LABGLOB 2.3 10/16/2018   AGRATIO 2.2 10/16/2018   BILITOT 0.6 04/17/2021   ALKPHOS 77 04/17/2021   AST 14 (L) 04/17/2021   ALT 10 04/17/2021   ANIONGAP 7 04/19/2021    CBG (last 3)  Recent Labs    04/19/21 1138  GLUCAP 101*     GFR: Estimated Creatinine Clearance: 59.1 mL/min (by C-G formula based on SCr of 0.71  mg/dL).  Coagulation Profile: No results for input(s): INR, PROTIME in the last 168 hours.  Recent Results (from the past 240 hour(s))  Resp Panel by RT-PCR (Flu A&B, Covid) Nasopharyngeal Swab     Status: None   Collection Time: 04/17/21  3:52 PM   Specimen: Nasopharyngeal Swab; Nasopharyngeal(NP) swabs in vial transport medium  Result Value Ref Range Status   SARS Coronavirus 2 by RT PCR NEGATIVE NEGATIVE Final    Comment: (NOTE) SARS-CoV-2 target nucleic acids are NOT DETECTED.  The SARS-CoV-2 RNA is generally detectable in upper respiratory specimens during the acute phase of infection. The lowest concentration of SARS-CoV-2 viral copies this assay can detect is 138 copies/mL. A negative result does not preclude SARS-Cov-2 infection and should not be used as the sole basis for treatment or other patient management decisions. A negative result may occur with  improper specimen collection/handling, submission of specimen other than nasopharyngeal swab, presence of viral mutation(s) within the areas targeted by this assay, and inadequate number of viral copies(<138 copies/mL). A negative result must be combined with clinical observations, patient history, and epidemiological information. The expected result is Negative.  Fact Sheet for Patients:  EntrepreneurPulse.com.au  Fact Sheet for Healthcare Providers:  IncredibleEmployment.be  This test is no t yet approved or cleared by the [place] FDA and  has been authorized for detection and/or diagnosis of SARS-CoV-2 by FDA under an Emergency Use Authorization (EUA). This EUA will remain  in effect (meaning this test can be used) for the duration of the COVID-19 declaration under Section 564(b)(1) of the Act, 21 U.S.C.section 360bbb-3(b)(1), unless the authorization is terminated  or revoked sooner.       Influenza A by PCR NEGATIVE NEGATIVE Final   Influenza B by PCR NEGATIVE NEGATIVE  Final    Comment: (NOTE) The Xpert Xpress SARS-CoV-2/FLU/RSV plus assay is intended as an aid in the diagnosis of influenza from Nasopharyngeal swab specimens and should not be used as a sole basis for treatment. Nasal washings and aspirates are unacceptable for Xpert Xpress SARS-CoV-2/FLU/RSV testing.  Fact Sheet for Patients: EntrepreneurPulse.com.au  Fact Sheet for Healthcare Providers: IncredibleEmployment.be  This test is not yet approved or cleared by the [place] FDA and has been authorized for detection and/or diagnosis of SARS-CoV-2 by FDA under an Emergency Use Authorization (EUA). This EUA will remain in effect (meaning this test can be used) for the duration of the COVID-19 declaration under Section 564(b)(1) of the Act, 21 U.S.C. section 360bbb-3(b)(1), unless the authorization is terminated or revoked.  Performed at [facility], [place] [address], [place], [place] [address]   Urine Culture     Status: Abnormal   Collection Time: 04/19/21  7:41 AM   Specimen: Urine, Clean Catch  Result Value Ref Range Status   Specimen Description URINE, CLEAN CATCH  Final   Special Requests NONE  Final  Culture (A)  Final    <10,000 COLONIES/mL INSIGNIFICANT GROWTH Performed at [facility] [address], [place], [place] [address]    Report Status 04/20/2021 FINAL  Final  MRSA Next Gen by PCR, Nasal     Status: Abnormal   Collection Time: 04/19/21 10:09 AM   Specimen: Nasal Mucosa; Nasal Swab  Result Value Ref Range Status   MRSA by PCR Next Gen DETECTED (A) NOT DETECTED Final    Comment: RESULT CALLED TO, READ BACK BY AND VERIFIED WITH: J. NARAMBAS RN, AT 1204 04/19/21 D. VANHOOK (NOTE) The GeneXpert MRSA Assay (FDA approved for NASAL specimens only), is one component of a comprehensive MRSA colonization surveillance program. It is not intended to diagnose MRSA infection nor to guide or monitor treatment for MRSA  infections. Test performance is not FDA approved in patients less than 24 years old. Performed at [facility], [place] [address], [place], [place] [address]      Scheduled Meds:  Chlorhexidine Gluconate Cloth  6 each Topical Q0600   donepezil  10 mg Oral QPM   mupirocin ointment  1 application Nasal BID   sulfamethoxazole-trimethoprim  1 tablet Oral Q12H   Continuous Infusions:     LOS: 1 day     Cordelia Poche, MD [facility] 04/20/2021, 11:55 AM  If 7PM-7AM, please contact night-coverage [email or web address]

## 2021-04-20 NOTE — Progress Notes (Signed)
OT Cancellation Note  Patient Details Name: Valerie [name] MRN: [id number] DOB: 06-Mar-1946   Cancelled Treatment:    Reason Eval/Treat Not Completed: OT screened, no needs identified, will sign off. At baseline patient mostly bedbound vs wheelchair bound with use of hoyer lift for transfers (reports getting to wheelchair a few times weekly). Spends most of time in a hospital bed. Patient reports requiring assist for bathing/dressing at bed level and at shower level with hoyer lift to shower chair (with assist of 2 family members). Patient works with her caregiver on strengthening and transfers (reporting doing so for years) and does not wish to work with acute OT at this time despite education on purpose and benefit. Patient also declining HH therapies. OT to sign off at this time. Recommend return home with family and 24hr supervision/assist with use of hoyer for transfers.   Gloris Manchester OTR/L Supplemental OT, Department of rehab services [phone]  Felipe Drone 04/20/2021, 2:49 PM

## 2021-04-20 NOTE — TOC Initial Note (Signed)
Transition of Care [facility]) - Initial/Assessment Note    Patient Details  Name: Valerie [name] MRN: [id number] Date of Birth: 08-08-1946  Transition of [facility]) CM/SW Contact:    Tom-Johnson, Renea Ee, RN Phone Number: 04/20/2021, 11:38 AM  Clinical Narrative:                 CM consulted for [facility] needs for post hospital transition. PT noted patient's family requesting hospital bed and sliding board. Also recommending home health PT. CM spoke with patient at bedside and spoke with spouse via phone. Both spouse and patient endorsed they have a hospital semi-electric bed at home. Does not need a hospital bed at this time. Sliding board ordered through [place] and spoke with Jodell Cipro [phone]) who will deliver to patient at bedside. Patient and spouse also endorsed they have a family friend who does therapy with patient and helps her with ADL's. CM explained to spouse that there is a difference between an aide and PT and what the difference is. Spouse states the lady has been working with patient for years and patient likes what she does. Declines PT services at this time. CM told patient that if there is any thing she needs to aid with transitioning to home to call. Number left with patient. TOC will continue to follow with needs.  Expected Discharge Plan: [place] Barriers to Discharge: Continued Medical Work up   Patient Goals and CMS Choice Patient states their goals for this hospitalization and ongoing recovery are:: To go home CMS Medicare.gov Compare Post Acute Care list provided to:: Patient Choice offered to / list presented to : Patient, Spouse  Expected Discharge Plan and Services Expected Discharge Plan: [place]   Discharge Planning Services: CM Consult Post Acute Care Choice: Home Health (Has private pay PT.) Living arrangements for the past 2 months: Single Family Home                 DME Arranged: Other see comment (Sliding  board.) DME Agency: [employer] Date DME Agency Contacted: 04/20/21 Time DME Agency Contacted: 1122 Representative spoke with at DME Agency: Jodell Cipro HH Arranged:  (Declines PT.Has private pay PT at home.)          Prior Living Arrangements/Services Living arrangements for the past 2 months: Single Family Home Lives with:: Spouse Patient language and need for interpreter reviewed:: Yes Do you feel safe going back to the place where you live?: Yes      Need for Family Participation in Patient Care: Yes (Comment) Care giver support system in place?: Yes (comment) Current home services: DME, Home PT (Private pay PT, wheelchair, walker, hospital bed.) Criminal Activity/Legal Involvement Pertinent to Current Situation/Hospitalization: No - Comment as needed  Activities of Daily Living      Permission Sought/Granted Permission sought to share information with : Case Manager, Family Supports, Customer service manager (Spouse) Permission granted to share information with : Yes, Verbal Permission Granted              Emotional Assessment Appearance:: Appears stated age Attitude/Demeanor/Rapport: Gracious, Engaged Affect (typically observed): Accepting, Hopeful, Appropriate, Calm Orientation: : Oriented to Self, Oriented to Place, Oriented to  Time, Oriented to Situation   Psych Involvement: No (comment)  Admission diagnosis:  Complicated UTI (urinary tract infection) [N39.0] Cystitis [N30.90] Patient Active Problem List   Diagnosis Date Noted   Complicated UTI (urinary tract infection) 04/17/2021   Malnutrition of moderate degree 06/13/2020   Rash  and nonspecific skin eruption    Metastatic malignant neoplasm (HCC)    Failure to thrive in adult    Asymptomatic bacteriuria    Pressure injury of skin 06/03/2020   Cellulitis 06/03/2020   SVT (supraventricular tachycardia) ([place]) 03/26/2020   Normocytic anemia 03/24/2020   Urinary tract infection without hematuria     AMS (altered mental status) 03/23/2020   Hypokalemia 03/23/2020   Prolonged Q-T interval on ECG 03/23/2020   DNR (do not resuscitate) 03/23/2020   MRSA bacteremia 03/23/2020   Acute combined systolic and diastolic heart failure (HCC)    Elevated troponin    Dilated cardiomyopathy ([place])    Coronary artery disease involving native coronary artery of native heart without angina pectoris    Congestive heart failure (CHF) ([place]) 02/14/2020   Pyelonephritis 09/10/2019   Acute blood loss anemia 06/12/2019   Closed fracture of distal end of right femur, initial encounter ([place]) 06/11/2019   Closed fracture of lateral portion of right tibial plateau 06/11/2019   History of DVT of lower extremity 06/11/2019   Blurry vision 01/06/2019   Headache 07/24/2016   Cough 08/24/2015   Chronic diarrhea 08/24/2015   Sepsis ([place])    Encounter for antineoplastic chemotherapy 01/31/2015   Urinary retention 10/20/2014   Ileus ([place]) 10/20/2014   Cellulitis of leg, right 10/20/2014   Fracture of right proximal fibula    Ankle fracture 10/02/2014   Ankle fracture 10/02/2014   Lung cancer ([place]) 10/02/2014   Closed fibular fracture 10/02/2014   Dementia ([place]) 10/02/2014   Lower urinary tract infectious disease 10/02/2014   Reactive airway disease 10/02/2014   Fall 10/02/2014   Closed left ankle fracture 10/01/2014   Right fibular fracture 10/01/2014   Lung metastasis ([place])    LESION, ANUS 04/26/2009   DIARRHEA 04/26/2009   Primary cancer of right upper lobe of lung ([place]) 04/22/2009   Malignant neoplasm of brain ([place]) 04/22/2009   VITAMIN D DEFICIENCY 04/22/2009   ANAL FISTULA 04/22/2009   OSTEOPOROSIS 04/22/2009   PCP:  [name], MD Pharmacy:   [facility], [place] [place] [address] Phone: [phone] Fax: [phone]  [place] [address] Phone:  [phone] Fax: [phone]     Social Determinants of Health (SDOH) Interventions    Readmission Risk Interventions Readmission Risk Prevention Plan 02/18/2020  Transportation Screening Complete  PCP or Specialist Appt within 5-7 Days Complete  Home Care Screening Patient refused  Medication Review (RN CM) Complete  Some recent data might be hidden

## 2021-04-20 NOTE — Progress Notes (Signed)
Length of Need 6 Months  °The above medical condition requires: Patient requires the ability to reposition frequently  °Head must be elevated greater than: 30 degrees  °Bed type Semi-electric  °Support Surface: Gel Overlay  ° °

## 2021-04-20 NOTE — Progress Notes (Signed)
Physical Therapy Treatment Patient Details Name: Valerie [name] MRN: [id number] DOB: 01-05-1946 Today's Date: 04/20/2021    History of Present Illness Pt is a 75yo female presenting to [facility] ED on 8/29 complaining of hematuria and generalized weakness, likely secondary to comlpicated UTI. PMH: lung cancer currently on chemo (tarceva), CHF, afib, DVT, anxiety, dementia, HTN, anemia.    PT Comments    Pt tolerates treatment well but continues to require significant physical assistance to safely transfer at this time. Pt unable to laterally scoot without physical assistance at this time and often demonstrates tendency to lean posteriorly in scooting attempts. Pt reports working on lateral transfers and stand/squat pivot transfers at home with PT. Pt will continue to benefit from transfer training in an effort to reduce caregiver burden.   Follow Up Recommendations  Home health PT;Supervision/Assistance - 24 hour (family requesting D/C home)     Equipment Recommendations  Hospital bed;Other (comment) (sliding board (already delivered to room))    Recommendations for Other Services       Precautions / Restrictions Precautions Precautions: Fall Restrictions Weight Bearing Restrictions: No    Mobility  Bed Mobility Overal bed mobility: Needs Assistance Bed Mobility: Sit to Supine       Sit to supine: Mod assist   General bed mobility comments: pt with poor trunk control in descent to bed, assistance for LE management and to pivot hips/trunk    Transfers Overall transfer level: Needs assistance Equipment used: 1 person hand held assist Transfers: Squat Pivot Transfers     Squat pivot transfers: Max assist     General transfer comment: pt requires cues for hand placement and anterior trunk lean. PT provides knee block and physical assist to clear buttocks and pivot. Pt unable to laterally scoot without physical assistance at this time  Ambulation/Gait Ambulation/Gait  assistance:  (pt reports not being able to ambulate for a considerable amount of time)               Stairs             Wheelchair Mobility    Modified Rankin (Stroke Patients Only)       Balance Overall balance assessment: Needs assistance Sitting-balance support: Feet supported;No upper extremity supported Sitting balance-Leahy Scale: Fair     Standing balance support:  (pt does not come to standing position this session)                                Cognition Arousal/Alertness: Awake/alert Behavior During Therapy: WFL for tasks assessed/performed Overall Cognitive Status: History of cognitive impairments - at baseline                                 General Comments: pt follows commands appropriately, has awareness of her mobility deficits      Exercises      General Comments        Pertinent Vitals/Pain Pain Assessment: Faces Faces Pain Scale: Hurts even more Pain Location: buttocks Pain Descriptors / Indicators: Grimacing Pain Intervention(s): Monitored during session    Home Living                      Prior Function            PT Goals (current goals can now be found in the care plan section) Acute Rehab PT  Goals Patient Stated Goal: To improve her health Progress towards PT goals: Not progressing toward goals - comment (remains limited to heavy assistance transfers)    Frequency    Min 3X/week      PT Plan Current plan remains appropriate    Co-evaluation              AM-PAC PT "6 Clicks" Mobility   Outcome Measure  Help needed turning from your back to your side while in a flat bed without using bedrails?: A Little Help needed moving from lying on your back to sitting on the side of a flat bed without using bedrails?: A Lot Help needed moving to and from a bed to a chair (including a wheelchair)?: A Lot Help needed standing up from a chair using your arms (e.g., wheelchair or  bedside chair)?: Total Help needed to walk in hospital room?: Total Help needed climbing 3-5 steps with a railing? : Total 6 Click Score: 10    End of Session Equipment Utilized During Treatment: Gait belt Activity Tolerance: Patient tolerated treatment well Patient left: in bed;with call bell/phone within reach;with bed alarm set Nurse Communication: Mobility status;Need for lift equipment PT Visit Diagnosis: Muscle weakness (generalized) (M62.81);Unsteadiness on feet (R26.81);Difficulty in walking, not elsewhere classified (R26.2)     Time: 6767-2094 PT Time Calculation (min) (ACUTE ONLY): 28 min  Charges:  $Therapeutic Activity: 23-37 mins                     Zenaida Niece, PT, DPT Acute Rehabilitation Pager: [phone]    Zenaida Niece 04/20/2021, 2:56 PM

## 2021-04-20 NOTE — Progress Notes (Signed)
ANTICOAGULATION CONSULT NOTE - Initial Consult  Pharmacy Consult for Xarelto Indication: atrial fibrillation, hx DVT  No Known Allergies  Patient Measurements: Height: 5\' 7"  (170.2 cm) Weight: 69.7 kg (153 lb 10.6 oz) IBW/kg (Calculated) : 61.6   Vital Signs: Temp: 97.8 F (36.6 C) (09/01 0438) Temp Source: Oral (09/01 0438) BP: 127/64 (09/01 0438) Pulse Rate: 68 (09/01 0438)  Labs: Recent Labs    04/17/21 1544 04/18/21 0011 04/18/21 0453 04/18/21 1007 04/19/21 0352 04/20/21 0417  HGB 11.8*   < >  --  11.1* 10.2* 10.5*  HCT 38.3   < >  --  36.7 32.1* 33.6*  PLT 353   < >  --  324 270 281  CREATININE 1.07*  --  0.91  --  0.71  --    < > = values in this interval not displayed.    Estimated Creatinine Clearance: 59.1 mL/min (by C-G formula based on SCr of 0.71 mg/dL).   Medical History: Past Medical History:  Diagnosis Date   Abnormal Pap smear 2006   Anal fistula    Ankle fracture    Anxiety    Arthritis    Atrophic vaginitis 2008   Cataract    Dementia ([place]) 2009   Dyspareunia 2008   H/O osteoporosis    H/O varicella    H/O vitamin D deficiency    Headache 07/24/2016   Heart murmur    History of measles, mumps, or rubella    History of radiation therapy 07/28/13- 08/10/13   right lung metastasis 5000 cGy 10 sessions   Hypertension    Lung cancer ([place]) dx'd 2002   Lung cancer ([place])    Lung cancer ([place])    Lung metastasis ([place])    PET scan 05/05/13, RUL lung nodule   Metastasis to brain [facility]) dx'd 2008   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   Nodule of right lung CT- 06/03/12   RIGHT UPPER LOBE   Nodule of right lung 06/03/12   Upper Lobe   On antineoplastic chemotherapy    TARCEVA   Osteoporosis 2010   Primary cancer of right upper lobe of lung ([place]) 04/22/2009   Qualifier: Diagnosis of  By: Nils Pyle CMA (AAMA), Leisha     Shortness of breath    hx lung ca    Status post chemotherapy 2003   CARBOPLATIN/PACLITAXEL /STATUS POST CLINICAL  TRAIL OF CELEBREX AND IRESSA AT [facility] FOR 1 YEAR   Status post radiation therapy 2003   LEFT LUNG   Status post radiation therapy 11/07/2005   WHOLE BRAIN: DR Larkin Ina [facility]   Status post radiation therapy 06/02/2008   GAMMA KNIFE OF RESECTED CAVITAY   Thyroid adenoma    ?   Thyroid cancer ([place]) 10/18/11 bx   adenoid nodules    Yeast infection    Assessment: 8 YOF with hx of afib and DVT on Xarelto PTA which was held d/t hematuria, now OK for restart  Goal of Therapy:  Monitor platelets by anticoagulation protocol: Yes   Plan:  Restart PTA Xarelto 20mg  PO daily Monitor renal function, s/s bleeding  Bertis Ruddy, PharmD Clinical Pharmacist Please check AMION for all [place] numbers 04/20/2021 12:03 PM

## 2021-04-20 NOTE — Plan of Care (Signed)
  Problem: Education: Goal: Knowledge of General Education information will improve Description: Including pain rating scale, medication(s)/side effects and non-pharmacologic comfort measures Outcome: Progressing   Problem: Health Behavior/Discharge Planning: Goal: Ability to manage health-related needs will improve Outcome: Progressing   Problem: Clinical Measurements: Goal: Ability to maintain clinical measurements within normal limits will improve Outcome: Progressing Goal: Will remain free from infection Outcome: Progressing Goal: Diagnostic test results will improve Outcome: Progressing   Problem: Coping: Goal: Level of anxiety will decrease Outcome: Progressing   Problem: Pain Managment: Goal: General experience of comfort will improve Outcome: Progressing   Problem: Urinary Elimination: Goal: Signs and symptoms of infection will decrease Outcome: Progressing

## 2021-04-21 ENCOUNTER — Other Ambulatory Visit (HOSPITAL_COMMUNITY): Payer: Self-pay

## 2021-04-21 LAB — CBC
HCT: 34.6 % — ABNORMAL LOW (ref 36.0–46.0)
Hemoglobin: 11 g/dL — ABNORMAL LOW (ref 12.0–15.0)
MCH: 29.5 pg (ref 26.0–34.0)
MCHC: 31.8 g/dL (ref 30.0–36.0)
MCV: 92.8 fL (ref 80.0–100.0)
Platelets: 293 10*3/uL (ref 150–400)
RBC: 3.73 MIL/uL — ABNORMAL LOW (ref 3.87–5.11)
RDW: 14.9 % (ref 11.5–15.5)
WBC: 5.7 10*3/uL (ref 4.0–10.5)
nRBC: 0 % (ref 0.0–0.2)

## 2021-04-21 LAB — BASIC METABOLIC PANEL
Anion gap: 7 (ref 5–15)
BUN: 14 mg/dL (ref 8–23)
CO2: 25 mmol/L (ref 22–32)
Calcium: 9.1 mg/dL (ref 8.9–10.3)
Chloride: 102 mmol/L (ref 98–111)
Creatinine, Ser: 0.92 mg/dL (ref 0.44–1.00)
GFR, Estimated: >60 mL/min (ref >60)
Glucose, Bld: 83 mg/dL (ref 70–99)
Potassium: 3.6 mmol/L (ref 3.5–5.1)
Sodium: 134 mmol/L — ABNORMAL LOW (ref 135–145)

## 2021-04-21 MED ORDER — SULFAMETHOXAZOLE-TRIMETHOPRIM 800-160 MG PO TABS
1.0000 | ORAL_TABLET | Freq: Two times a day (BID) | ORAL | Status: DC
  Administered 2021-04-21 – 2021-04-23 (×5): 1 via ORAL
  Filled 2021-04-21 (×5): qty 1

## 2021-04-21 NOTE — Progress Notes (Signed)
PROGRESS NOTE    SMT LOKEY  KMQ:[id number] DOB: [birth year] DOA: 04/17/2021 PCP: [name], MD   Brief Narrative: Valerie [name] is a 75 y.o. 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  Patient presented secondary to hematuria in setting of acute UTI and in setting of Xarelto use.  Patient started empirically on meropenem IV on admission.   Assessment & Plan:   UTI with gross hematuria Patient with a prior history of ESBL infections, Enterobacter etc.  -Started on IV meropenem on admission, and was subsequently changed to Bactrim 8/31, unfortunately not collected at the time of admission, urine culture after starting meropenem with less than 10K colonies -Had restarted Xarelto after hematuria had cleared, unfortunately hematuria has recurred, will hold Xarelto, discussed with patient regarding A. fib risk while off anticoagulation -Renal protocol CT abdomen was unrevealing, may need urology follow-up if recurs -Ambulate, PT OT  History of DVT and A. Fib -Remote history of leg DVT many years ago, Dopplers from 10/21 were negative -Had restarted Xarelto yesterday, unfortunately had recurrence of gross hematuria, will discontinue Xarelto  Atrial fibrillation -In sinus rhythm now, see discussion  Chronic anemia Baseline appears to be around 8 but has been as high as 12 more recently. -Continue to monitor  Chronic systolic CHF -presumed to be related to prior treatment with Tagrisso.  -Clinically appears euvolemic  -EF of 25-30% from 2021. Patient is on Toprol XL as an outpatient although patient states she is no longer on this  Metastatic non-small cell lung cancer Patient follows with Dr. Julien Nordmann. Currently on Tarceva.  Dementia Stable. -Continue Aricept -Delirium precautions   DVT prophylaxis: SCDs Code Status:   Code Status: DNR Family  Communication: Discussed with family at bedside Disposition Plan: Home likely tomorrow   Consultants:  Infectious disease  Procedures:  None  Antimicrobials: Meropenem IV-changed to Bactrim   Subjective: -Having profound hematuria again  Objective: Vitals:   04/20/21 1616 04/20/21 2103 04/21/21 0437 04/21/21 1010  BP: (!) 115/53 (!) 122/58 128/69 (!) 109/59  Pulse: 78 73 80 77  Resp: 18 16 16 18   Temp: 98.4 F (36.9 C) 98.1 F (36.7 C) 98 F (36.7 C) 98.1 F (36.7 C)  TempSrc: Oral Oral Oral Oral  SpO2: 100% 99% 100% 100%  Weight:   69.8 kg   Height:        Intake/Output Summary (Last 24 hours) at 04/21/2021 1225 Last data filed at 04/21/2021 0700 Gross per 24 hour  Intake 230 ml  Output 700 ml  Net -470 ml   Filed Weights   04/18/21 2107 04/20/21 0438 04/21/21 0437  Weight: 72.6 kg 69.7 kg 69.8 kg    Examination:  General exam: Pleasant thinly built female sitting up in bed, AAOx3, no distress CVS: S1-S2, regular rhythm, systolic ejection murmur Lungs: Decreased breath sounds at the bases otherwise clear Abdomen: Soft, nontender, bowel sounds present Extremities: No edema  Skin: No rashes on exposed skin Psych: Appropriate mood and affect   Data Reviewed: I have personally reviewed following labs and imaging studies  CBC Lab Results  Component Value Date   WBC 5.7 04/21/2021   RBC 3.73 (L) 04/21/2021   HGB 11.0 (L) 04/21/2021   HCT 34.6 (L) 04/21/2021   MCV 92.8 04/21/2021   MCH 29.5 04/21/2021   PLT 293 04/21/2021   MCHC 31.8 04/21/2021   RDW 14.9 04/21/2021   LYMPHSABS 1.2  04/17/2021   MONOABS 0.5 04/17/2021   EOSABS 0.1 04/17/2021   BASOSABS 0.0 37/90/2409     Last metabolic panel Lab Results  Component Value Date   NA 134 (L) 04/21/2021   K 3.6 04/21/2021   CL 102 04/21/2021   CO2 25 04/21/2021   BUN 14 04/21/2021   CREATININE 0.92 04/21/2021   GLUCOSE 83 04/21/2021   GFRNONAA >60 04/21/2021   GFRAA >60 04/26/2020   CALCIUM 9.1  04/21/2021   PHOS 3.8 06/13/2020   PROT 6.4 (L) 04/17/2021   ALBUMIN 3.6 04/17/2021   LABGLOB 2.3 10/16/2018   AGRATIO 2.2 10/16/2018   BILITOT 0.6 04/17/2021   ALKPHOS 77 04/17/2021   AST 14 (L) 04/17/2021   ALT 10 04/17/2021   ANIONGAP 7 04/21/2021    CBG (last 3)  Recent Labs    04/19/21 1138  GLUCAP 101*     GFR: Estimated Creatinine Clearance: 51.4 mL/min (by C-G formula based on SCr of 0.92 mg/dL).  Coagulation Profile: No results for input(s): INR, PROTIME in the last 168 hours.  Recent Results (from the past 240 hour(s))  Resp Panel by RT-PCR (Flu A&B, Covid) Nasopharyngeal Swab     Status: None   Collection Time: 04/17/21  3:52 PM   Specimen: Nasopharyngeal Swab; Nasopharyngeal(NP) swabs in vial transport medium  Result Value Ref Range Status   SARS Coronavirus 2 by RT PCR NEGATIVE NEGATIVE Final    Comment: (NOTE) SARS-CoV-2 target nucleic acids are NOT DETECTED.  The SARS-CoV-2 RNA is generally detectable in upper respiratory specimens during the acute phase of infection. The lowest concentration of SARS-CoV-2 viral copies this assay can detect is 138 copies/mL. A negative result does not preclude SARS-Cov-2 infection and should not be used as the sole basis for treatment or other patient management decisions. A negative result may occur with  improper specimen collection/handling, submission of specimen other than nasopharyngeal swab, presence of viral mutation(s) within the areas targeted by this assay, and inadequate number of viral copies(<138 copies/mL). A negative result must be combined with clinical observations, patient history, and epidemiological information. The expected result is Negative.  Fact Sheet for Patients:  EntrepreneurPulse.com.au  Fact Sheet for Healthcare Providers:  IncredibleEmployment.be  This test is no t yet approved or cleared by the [place] FDA and  has been authorized for  detection and/or diagnosis of SARS-CoV-2 by FDA under an Emergency Use Authorization (EUA). This EUA will remain  in effect (meaning this test can be used) for the duration of the COVID-19 declaration under Section 564(b)(1) of the Act, 21 U.S.C.section 360bbb-3(b)(1), unless the authorization is terminated  or revoked sooner.       Influenza A by PCR NEGATIVE NEGATIVE Final   Influenza B by PCR NEGATIVE NEGATIVE Final    Comment: (NOTE) The Xpert Xpress SARS-CoV-2/FLU/RSV plus assay is intended as an aid in the diagnosis of influenza from Nasopharyngeal swab specimens and should not be used as a sole basis for treatment. Nasal washings and aspirates are unacceptable for Xpert Xpress SARS-CoV-2/FLU/RSV testing.  Fact Sheet for Patients: EntrepreneurPulse.com.au  Fact Sheet for Healthcare Providers: IncredibleEmployment.be  This test is not yet approved or cleared by the [place] FDA and has been authorized for detection and/or diagnosis of SARS-CoV-2 by FDA under an Emergency Use Authorization (EUA). This EUA will remain in effect (meaning this test can be used) for the duration of the COVID-19 declaration under Section 564(b)(1) of the Act, 21 U.S.C. section 360bbb-3(b)(1), unless the authorization is terminated  or revoked.  Performed at [facility], [place] [address], [place], [place] [address]   Urine Culture     Status: Abnormal   Collection Time: 04/19/21  7:41 AM   Specimen: Urine, Clean Catch  Result Value Ref Range Status   Specimen Description URINE, CLEAN CATCH  Final   Special Requests NONE  Final   Culture (A)  Final    <10,000 COLONIES/mL INSIGNIFICANT GROWTH Performed at [facility], [place] [address], [place], [place] [address]    Report Status 04/20/2021 FINAL  Final  MRSA Next Gen by PCR, Nasal     Status: Abnormal   Collection Time: 04/19/21 10:09 AM   Specimen: Nasal Mucosa; Nasal Swab  Result Value  Ref Range Status   MRSA by PCR Next Gen DETECTED (A) NOT DETECTED Final    Comment: RESULT CALLED TO, READ BACK BY AND VERIFIED WITH: J. NARAMBAS RN, AT 1204 04/19/21 D. VANHOOK (NOTE) The GeneXpert MRSA Assay (FDA approved for NASAL specimens only), is one component of a comprehensive MRSA colonization surveillance program. It is not intended to diagnose MRSA infection nor to guide or monitor treatment for MRSA infections. Test performance is not FDA approved in patients less than 72 years old. Performed at [facility], [place] [address] [place], [place] [address]      Scheduled Meds:  Chlorhexidine Gluconate Cloth  6 each Topical Q0600   donepezil  10 mg Oral QPM   mupirocin ointment  1 application Nasal BID   sulfamethoxazole-trimethoprim  1 tablet Oral Q12H   Continuous Infusions:     LOS: 2 days     Cordelia Poche, MD [facility] 04/21/2021, 12:25 PM  If 7PM-7AM, please contact night-coverage [email or web address]

## 2021-04-21 NOTE — Care Management Important Message (Signed)
Important Message  Patient Details  Name: Valerie [name] MRN: [id number] Date of Birth: Oct 14, 1945   Medicare Important Message Given:  Yes     Orbie Pyo 04/21/2021, 3:07 PM

## 2021-04-22 LAB — CBC
HCT: 31 % — ABNORMAL LOW (ref 36.0–46.0)
Hemoglobin: 10 g/dL — ABNORMAL LOW (ref 12.0–15.0)
MCH: 29.5 pg (ref 26.0–34.0)
MCHC: 32.3 g/dL (ref 30.0–36.0)
MCV: 91.4 fL (ref 80.0–100.0)
Platelets: 259 10*3/uL (ref 150–400)
RBC: 3.39 MIL/uL — ABNORMAL LOW (ref 3.87–5.11)
RDW: 14.7 % (ref 11.5–15.5)
WBC: 6 10*3/uL (ref 4.0–10.5)
nRBC: 0 % (ref 0.0–0.2)

## 2021-04-22 LAB — BASIC METABOLIC PANEL
Anion gap: 7 (ref 5–15)
BUN: 20 mg/dL (ref 8–23)
CO2: 23 mmol/L (ref 22–32)
Calcium: 9.1 mg/dL (ref 8.9–10.3)
Chloride: 105 mmol/L (ref 98–111)
Creatinine, Ser: 0.95 mg/dL (ref 0.44–1.00)
GFR, Estimated: >60 mL/min (ref >60)
Glucose, Bld: 94 mg/dL (ref 70–99)
Potassium: 3.8 mmol/L (ref 3.5–5.1)
Sodium: 135 mmol/L (ref 135–145)

## 2021-04-22 NOTE — Progress Notes (Signed)
PROGRESS NOTE    Valerie [name]  NMM:[id number] DOB: 02/21/1946 DOA: 04/17/2021 PCP: [name], MD   Brief Narrative: TKAI LARGE is a 75 y.o. 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  Patient presented secondary to hematuria in setting of acute UTI and in setting of Xarelto use.  Patient started empirically on meropenem IV on admission.   Assessment & Plan:   UTI with gross hematuria Patient with a prior history of ESBL infections, Enterobacter etc.  -Started on IV meropenem on admission, and was subsequently changed to Bactrim 8/31, unfortunately culture not collected at the time of admission, culture sent after starting meropenem with less than 10K colonies -Had restarted Xarelto after hematuria had cleared, unfortunately hematuria has recurred, holding Xarelto again, discussed with patient regarding A. fib/stroke risk while off anticoagulation -Renal protocol CT abdomen was unrevealing, may need urology follow-up if recurs -Hematuria improving, monitor off anticoagulation -Discharge planning  History of DVT and A. Fib -Remote history of leg DVT many years ago, Dopplers from 10/21 were negative -Had restarted Xarelto, unfortunately had recurrence of gross hematuria, discontinued Xarelto again  Atrial fibrillation -In sinus rhythm now, see discussion  Chronic anemia Baseline appears to be around 8 but has been as high as 12 more recently. -Continue to monitor  Chronic systolic CHF -presumed to be related to prior treatment with Tagrisso.  -Clinically appears euvolemic  -EF of 25-30% from 2021. Patient is on Toprol XL as an outpatient although patient states she is no longer on this  Metastatic non-small cell lung cancer Patient follows with Dr. Julien Nordmann. Currently on Tarceva.  Dementia Stable. -Continue Aricept -Delirium precautions   DVT  prophylaxis: SCDs Code Status:   Code Status: DNR Family Communication: Discussed with family at bedside yesterday Disposition Plan: Home likely tomorrow   Consultants:  Infectious disease  Procedures:  None  Antimicrobials: Meropenem IV-changed to Bactrim   Subjective: -Hematuria starting to improve, still bloody urine but not frank Hematuria as yesterday  Objective: Vitals:   04/21/21 0437 04/21/21 1010 04/21/21 2122 04/22/21 0533  BP: 128/69 (!) 109/59 106/62 123/67  Pulse: 80 77 97 79  Resp: 16 18 16 16   Temp: 98 F (36.7 C) 98.1 F (36.7 C) 98.9 F (37.2 C) 98.1 F (36.7 C)  TempSrc: Oral Oral Oral Oral  SpO2: 100% 100% 95% 98%  Weight: 69.8 kg   70.5 kg  Height:        Intake/Output Summary (Last 24 hours) at 04/22/2021 1041 Last data filed at 04/22/2021 0900 Gross per 24 hour  Intake 220 ml  Output 700 ml  Net -480 ml   Filed Weights   04/20/21 0438 04/21/21 0437 04/22/21 0533  Weight: 69.7 kg 69.8 kg 70.5 kg    Examination:  General exam: Pleasant thinly built female sitting up in bed, AAOx3, no distress CVS: S1-S2, regular rhythm, systolic ejection murmur Lungs: Decreased breath sounds to bases Abdomen: Soft, nontender, bowel sounds present Extremities: No edema  Skin: No rashes on exposed skin Psych: Appropriate mood and affect   Data Reviewed: I have personally reviewed following labs and imaging studies  CBC Lab Results  Component Value Date   WBC 6.0 04/22/2021   RBC 3.39 (L) 04/22/2021   HGB 10.0 (L) 04/22/2021   HCT 31.0 (L) 04/22/2021   MCV 91.4 04/22/2021   MCH 29.5 04/22/2021   PLT 259 04/22/2021   MCHC 32.3  04/22/2021   RDW 14.7 04/22/2021   LYMPHSABS 1.2 04/17/2021   MONOABS 0.5 04/17/2021   EOSABS 0.1 04/17/2021   BASOSABS 0.0 16/05/9603     Last metabolic panel Lab Results  Component Value Date   NA 135 04/22/2021   K 3.8 04/22/2021   CL 105 04/22/2021   CO2 23 04/22/2021   BUN 20 04/22/2021   CREATININE 0.95  04/22/2021   GLUCOSE 94 04/22/2021   GFRNONAA >60 04/22/2021   GFRAA >60 04/26/2020   CALCIUM 9.1 04/22/2021   PHOS 3.8 06/13/2020   PROT 6.4 (L) 04/17/2021   ALBUMIN 3.6 04/17/2021   LABGLOB 2.3 10/16/2018   AGRATIO 2.2 10/16/2018   BILITOT 0.6 04/17/2021   ALKPHOS 77 04/17/2021   AST 14 (L) 04/17/2021   ALT 10 04/17/2021   ANIONGAP 7 04/22/2021    CBG (last 3)  Recent Labs    04/19/21 1138  GLUCAP 101*     GFR: Estimated Creatinine Clearance: 49.8 mL/min (by C-G formula based on SCr of 0.95 mg/dL).  Coagulation Profile: No results for input(s): INR, PROTIME in the last 168 hours.  Recent Results (from the past 240 hour(s))  Resp Panel by RT-PCR (Flu A&B, Covid) Nasopharyngeal Swab     Status: None   Collection Time: 04/17/21  3:52 PM   Specimen: Nasopharyngeal Swab; Nasopharyngeal(NP) swabs in vial transport medium  Result Value Ref Range Status   SARS Coronavirus 2 by RT PCR NEGATIVE NEGATIVE Final    Comment: (NOTE) SARS-CoV-2 target nucleic acids are NOT DETECTED.  The SARS-CoV-2 RNA is generally detectable in upper respiratory specimens during the acute phase of infection. The lowest concentration of SARS-CoV-2 viral copies this assay can detect is 138 copies/mL. A negative result does not preclude SARS-Cov-2 infection and should not be used as the sole basis for treatment or other patient management decisions. A negative result may occur with  improper specimen collection/handling, submission of specimen other than nasopharyngeal swab, presence of viral mutation(s) within the areas targeted by this assay, and inadequate number of viral copies(<138 copies/mL). A negative result must be combined with clinical observations, patient history, and epidemiological information. The expected result is Negative.  Fact Sheet for Patients:  EntrepreneurPulse.com.au  Fact Sheet for Healthcare Providers:   IncredibleEmployment.be  This test is no t yet approved or cleared by the [place] FDA and  has been authorized for detection and/or diagnosis of SARS-CoV-2 by FDA under an Emergency Use Authorization (EUA). This EUA will remain  in effect (meaning this test can be used) for the duration of the COVID-19 declaration under Section 564(b)(1) of the Act, 21 U.S.C.section 360bbb-3(b)(1), unless the authorization is terminated  or revoked sooner.       Influenza A by PCR NEGATIVE NEGATIVE Final   Influenza B by PCR NEGATIVE NEGATIVE Final    Comment: (NOTE) The Xpert Xpress SARS-CoV-2/FLU/RSV plus assay is intended as an aid in the diagnosis of influenza from Nasopharyngeal swab specimens and should not be used as a sole basis for treatment. Nasal washings and aspirates are unacceptable for Xpert Xpress SARS-CoV-2/FLU/RSV testing.  Fact Sheet for Patients: EntrepreneurPulse.com.au  Fact Sheet for Healthcare Providers: IncredibleEmployment.be  This test is not yet approved or cleared by the [place] FDA and has been authorized for detection and/or diagnosis of SARS-CoV-2 by FDA under an Emergency Use Authorization (EUA). This EUA will remain in effect (meaning this test can be used) for the duration of the COVID-19 declaration under Section 564(b)(1) of the Act,  21 U.S.C. section 360bbb-3(b)(1), unless the authorization is terminated or revoked.  Performed at [facility], [place] [address] [place], [place] [address]   Urine Culture     Status: Abnormal   Collection Time: 04/19/21  7:41 AM   Specimen: Urine, Clean Catch  Result Value Ref Range Status   Specimen Description URINE, CLEAN CATCH  Final   Special Requests NONE  Final   Culture (A)  Final    <10,000 COLONIES/mL INSIGNIFICANT GROWTH Performed at [facility], [place] [address], [place], [place] [address]    Report Status 04/20/2021 FINAL  Final   MRSA Next Gen by PCR, Nasal     Status: Abnormal   Collection Time: 04/19/21 10:09 AM   Specimen: Nasal Mucosa; Nasal Swab  Result Value Ref Range Status   MRSA by PCR Next Gen DETECTED (A) NOT DETECTED Final    Comment: RESULT CALLED TO, READ BACK BY AND VERIFIED WITH: J. NARAMBAS RN, AT 1204 04/19/21 D. VANHOOK (NOTE) The GeneXpert MRSA Assay (FDA approved for NASAL specimens only), is one component of a comprehensive MRSA colonization surveillance program. It is not intended to diagnose MRSA infection nor to guide or monitor treatment for MRSA infections. Test performance is not FDA approved in patients less than 51 years old. Performed at [facility], [place] [address], [place], [place] [address]      Scheduled Meds:  Chlorhexidine Gluconate Cloth  6 each Topical Q0600   donepezil  10 mg Oral QPM   mupirocin ointment  1 application Nasal BID   sulfamethoxazole-trimethoprim  1 tablet Oral Q12H     LOS: 3 days     Cordelia Poche, MD [facility] 04/22/2021, 10:41 AM  If 7PM-7AM, please contact night-coverage [email or web address]

## 2021-04-22 NOTE — Plan of Care (Signed)
  Problem: Health Behavior/Discharge Planning: Goal: Ability to manage health-related needs will improve Outcome: Progressing   Problem: Urinary Elimination: Goal: Signs and symptoms of infection will decrease Outcome: Progressing

## 2021-04-23 LAB — CBC
HCT: 32.3 % — ABNORMAL LOW (ref 36.0–46.0)
Hemoglobin: 10.4 g/dL — ABNORMAL LOW (ref 12.0–15.0)
MCH: 29.2 pg (ref 26.0–34.0)
MCHC: 32.2 g/dL (ref 30.0–36.0)
MCV: 90.7 fL (ref 80.0–100.0)
Platelets: 262 10*3/uL (ref 150–400)
RBC: 3.56 MIL/uL — ABNORMAL LOW (ref 3.87–5.11)
RDW: 14.7 % (ref 11.5–15.5)
WBC: 6 10*3/uL (ref 4.0–10.5)
nRBC: 0 % (ref 0.0–0.2)

## 2021-04-23 MED ORDER — SULFAMETHOXAZOLE-TRIMETHOPRIM 800-160 MG PO TABS: 1.0000 | ORAL_TABLET | Freq: Two times a day (BID) | ORAL | 0 refills | Status: AC

## 2021-04-23 NOTE — Progress Notes (Signed)
DISCHARGE NOTE HOME MATTHEW PAIS to be discharged Home per MD order. Discussed prescriptions and follow up appointments with the patient. Prescriptions given to patient; medication list explained in detail. Patient verbalized understanding.  Skin clean, dry and intact without evidence of skin break down, no evidence of skin tears noted. IV catheter discontinued intact. Site without signs and symptoms of complications. Dressing and pressure applied. Pt denies pain at the site currently. No complaints noted.  Patient free of lines, drains, and wounds.   An After Visit Summary (AVS) was printed and given to the patient. Patient picked up by [facility] via stretcher and discharged to home.  Dolores Hoose, RN

## 2021-04-23 NOTE — Progress Notes (Signed)
Husband notified that [name] is here to get wife.

## 2021-04-23 NOTE — TOC Transition Note (Addendum)
Transition of Care [facility]) - CM/SW Discharge Note   Patient Details  Name: Valerie [name] MRN: [id number] Date of Birth: 18-Nov-1945  [facility] CM/SW Contact:  Bartholomew Crews, RN Phone Number: [phone] 04/23/2021, 11:09 AM   Clinical Narrative:     Spoke with patient at the bedside to discuss transition plans. Home with husband. Stated that she has private pay caregiver who assists her with PT. Discussed home health PT/OT. Patient declines. Advised that PCP can refer as well if she changes her mind. Patient verbalized understanding. Patient stated that someone will be picking her up later today. No further TOC needs identified.   1530: Notified by nursing that patient needed transportation home. Spoke with pateint at the bedside. Patient stated that her husband was working on finding someone to pick her up. Offered cab, patient declined stating her husband would work it out.  1715: Notified by nursing that patient needed [facility] transport. Spoke with spouse on the phone to verify that he was home and verify address. [facility] arranged. Medical transport paperwork completed.   Final next level of care: Home/Self Care Barriers to Discharge: No Barriers Identified   Patient Goals and CMS Choice Patient states their goals for this hospitalization and ongoing recovery are:: return home with husband and caregiver CMS Medicare.gov Compare Post Acute Care list provided to:: Patient Choice offered to / list presented to : Patient  Discharge Placement                       Discharge Plan and Services   Discharge Planning Services: CM Consult Post Acute Care Choice: Home Health (Has private pay PT.)          DME Arranged: Other see comment (Sliding board.) DME Agency: [employer] Date DME Agency Contacted: 04/20/21 Time DME Agency Contacted: 1122 Representative spoke with at DME Agency: [place]: Refused [place] Agency: NA        Social Determinants of  Health ([place]) Interventions     Readmission Risk Interventions Readmission Risk Prevention Plan 02/18/2020  Transportation Screening Complete  PCP or Specialist Appt within 5-7 Days Complete  Home Care Screening Patient refused  Medication Review (RN CM) Complete  Some recent data might be hidden

## 2021-04-23 NOTE — Plan of Care (Signed)
  Problem: Education: Goal: Knowledge of General Education information will improve Description: Including pain rating scale, medication(s)/side effects and non-pharmacologic comfort measures Outcome: Adequate for Discharge   Problem: Health Behavior/Discharge Planning: Goal: Ability to manage health-related needs will improve 04/23/2021 1057 by Dolores Hoose, RN Outcome: Adequate for Discharge 04/23/2021 0743 by Dolores Hoose, RN Outcome: Progressing   Problem: Clinical Measurements: Goal: Ability to maintain clinical measurements within normal limits will improve Outcome: Adequate for Discharge Goal: Will remain free from infection Outcome: Adequate for Discharge Goal: Diagnostic test results will improve Outcome: Adequate for Discharge Goal: Respiratory complications will improve Outcome: Adequate for Discharge Goal: Cardiovascular complication will be avoided Outcome: Adequate for Discharge   Problem: Activity: Goal: Risk for activity intolerance will decrease 04/23/2021 1057 by Dolores Hoose, RN Outcome: Adequate for Discharge 04/23/2021 0743 by Dolores Hoose, RN Outcome: Progressing   Problem: Nutrition: Goal: Adequate nutrition will be maintained Outcome: Adequate for Discharge   Problem: Coping: Goal: Level of anxiety will decrease Outcome: Adequate for Discharge   Problem: Elimination: Goal: Will not experience complications related to bowel motility 04/23/2021 1057 by Dolores Hoose, RN Outcome: Adequate for Discharge 04/23/2021 0743 by Dolores Hoose, RN Outcome: Progressing Goal: Will not experience complications related to urinary retention 04/23/2021 1057 by Dolores Hoose, RN Outcome: Adequate for Discharge 04/23/2021 0743 by Dolores Hoose, RN Outcome: Progressing   Problem: Pain Managment: Goal: General experience of comfort will improve Outcome: Adequate for Discharge   Problem: Safety: Goal: Ability to remain free from injury will  improve Outcome: Adequate for Discharge   Problem: Skin Integrity: Goal: Risk for impaired skin integrity will decrease Outcome: Adequate for Discharge   Problem: Urinary Elimination: Goal: Signs and symptoms of infection will decrease Outcome: Adequate for Discharge   Problem: Acute Rehab PT Goals(only PT should resolve) Goal: Pt Will Go Supine/Side To Sit Outcome: Adequate for Discharge Goal: Pt Will Go Sit To Supine/Side Outcome: Adequate for Discharge Goal: Patient Will Perform Sitting Balance Outcome: Adequate for Discharge Goal: Patient Will Transfer Sit To/From Stand Outcome: Adequate for Discharge Goal: Pt Will Transfer Bed To Chair/Chair To Bed Outcome: Adequate for Discharge

## 2021-04-23 NOTE — Plan of Care (Signed)
  Problem: Health Behavior/Discharge Planning: Goal: Ability to manage health-related needs will improve Outcome: Progressing   Problem: Activity: Goal: Risk for activity intolerance will decrease Outcome: Progressing   Problem: Elimination: Goal: Will not experience complications related to bowel motility Outcome: Progressing Goal: Will not experience complications related to urinary retention Outcome: Progressing

## 2021-04-24 NOTE — Discharge Summary (Signed)
Physician Discharge Summary  Valerie [name] GUR:[id number] DOB: Aug 25, 1945 DOA: 04/17/2021  PCP: [name], MD  Admit date: 04/17/2021 Discharge date: 04/23/2021  Time spent: 35 minutes  Recommendations for Outpatient Follow-up:  PCP in 1 week, consider restarting low-dose beta-blocker if blood pressure tolerates, discussed long-term anticoagulation for A. Fib Follow-up with urology if hematuria recurs Home health PT OT   Discharge Diagnoses:  Principal Problem:   Complicated UTI (urinary tract infection) Hematuria Metastatic lung cancer Chronic systolic CHF   Primary cancer of right upper lobe of lung ([place])   Dementia ([place])   History of DVT of lower extremity   DNR (do not resuscitate)   Discharge Condition: Stable  Diet recommendation: Low-sodium  Filed Weights   04/20/21 0438 04/21/21 0437 04/22/21 0533  Weight: 69.7 kg 69.8 kg 70.5 kg    History of present illness:  Valerie [name] is a 75 y.o. 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  Patient presented secondary to hematuria in setting of acute UTI and in setting of Xarelto use.  Patient started empirically on meropenem IV on admission.  Hospital Course:   UTI with gross hematuria Patient with a prior history of ESBL infections, Enterobacter etc.  -Started on IV meropenem on admission, and was subsequently changed to Bactrim 8/31, unfortunately culture not collected at the time of admission, culture sent after starting meropenem with less than 10K colonies -Had restarted Xarelto after hematuria had cleared, unfortunately gross hematuria has recurred, holding Xarelto again, discussed with patient regarding A. fib/stroke risk while off anticoagulation -Renal protocol CT abdomen was unrevealing, may need urology follow-up if recurs -Hematuria improved off anticoagulation, patient does not  want to reattempt anticoagulation -Advised to follow-up with PCP in 1 week   History of DVT and A. Fib -Remote history of leg DVT 4-5 years ago, Dopplers from 10/21 were negative -Most recently had been on Xarelto for A. fib, this was discontinued by patient prior to admission in the setting of gross hematuria, we attempted to restart Xarelto while inpatient, hematuria recurred and hence this was held again, she declined reattempting anticoagulation at this time -Follow-up with PCP and cardiology in a few weeks, consider reattempting anticoagulation following completion of treatment of UTI if no further recurrence of hematuria   Atrial fibrillation -In sinus rhythm now, see discussion   Chronic anemia -Has longstanding anemia from chronic disease, metastatic cancer and chemotherapy  Chronic systolic CHF -presumed to be related to prior treatment with Tagrisso.  -Clinically appears euvolemic  -EF of 25-30% from 2021.  -Reportedly no longer taking Toprol, clinically euvolemic, did not require diuretics -Advised to follow-up with PCP, hopefully restart low-dose Toprol if blood pressure tolerates, pressures were in the 95-100 range while inpatient hence did not resume it here   Metastatic non-small cell lung cancer Patient follows with Dr. Julien Nordmann. Currently on Tarceva.   Mild dementia Stable. -Continue Aricept  Discharge Exam: Vitals:   04/22/21 1824 04/22/21 2018  BP: 105/67 102/63  Pulse: 87 78  Resp: 20 15  Temp: 99 F (37.2 C) 98.3 F (36.8 C)  SpO2: 97% 99%    General: Awake alert oriented x3, mild cognitive deficits Cardiovascular: S1-S2, regular rate rhythm Respiratory: Clear  Discharge Instructions   Discharge Instructions     Diet - low sodium heart healthy   Complete by: As directed    Increase activity slowly   Complete by: As  directed       Allergies as of 04/23/2021   No Known Allergies      Medication List     STOP taking these medications     metoprolol succinate 25 MG 24 hr tablet Commonly known as: TOPROL-XL   potassium chloride SA 20 MEQ tablet Commonly known as: KLOR-CON   Xarelto 20 MG Tabs tablet Generic drug: rivaroxaban       TAKE these medications    cetaphil lotion Apply 1 application topically daily as needed for dry skin.   D3 PO Take 1 capsule by mouth daily.   diphenhydrAMINE 25 MG tablet Commonly known as: BENADRYL Take 25 mg by mouth every 6 (six) hours as needed for allergies.   donepezil 10 MG tablet Commonly known as: ARICEPT TAKE 1 TABLET BY MOUTH ONCE DAILY IN THE EVENING What changed: when to take this   erlotinib 100 MG tablet Commonly known as: TARCEVA TAKE 1 TABLET (100 MG TOTAL) BY MOUTH DAILY. TAKE ON AN EMPTY STOMACH 1 HOUR BEFORE MEALS OR 2 HOURS AFTER What changed:  how much to take how to take this when to take this   loperamide 2 MG tablet Commonly known as: IMODIUM A-D Take 4 mg by mouth 3 (three) times daily as needed for diarrhea or loose stools.   MAGNESIUM PO Take 200 mg by mouth daily.   Multi-Vitamins Tabs Take 1 tablet by mouth every evening.   prochlorperazine 10 MG tablet Commonly known as: COMPAZINE Take 1 tablet (10 mg total) by mouth every 6 (six) hours as needed for nausea or vomiting.   sulfamethoxazole-trimethoprim 800-160 MG tablet Commonly known as: BACTRIM DS Take 1 tablet by mouth every 12 (twelve) hours for 4 days.   vitamin B-12 1000 MCG tablet Commonly known as: CYANOCOBALAMIN Take 1,000 mcg by mouth every evening.   vitamin C 100 MG tablet Take 100 mg by mouth every evening. Gummie       No Known Allergies  Follow-up Information     [name], MD. Schedule an appointment as soon as possible for a visit in 1 week(s).   Specialty: Internal Medicine Why: Discuss anticoagulation(Xarelto) at follow up Contact information: [address] [place] [address] [phone]         Buford Dresser, MD  .   Specialty: Cardiology Contact information: [address] [place] [address] [phone]                  The results of significant diagnostics from this hospitalization (including imaging, microbiology, ancillary and laboratory) are listed below for reference.    Significant Diagnostic Studies: CT RENAL STONE STUDY  Result Date: 04/17/2021 CLINICAL DATA:  Flank pain.  Concern for kidney stone. EXAM: CT ABDOMEN AND PELVIS WITHOUT CONTRAST TECHNIQUE: Multidetector CT imaging of the abdomen and pelvis was performed following the standard protocol without IV contrast. COMPARISON:  CT dated 02/23/2021. FINDINGS: Evaluation of this exam is limited in the absence of intravenous contrast. Lower chest: There is eventration of the left hemidiaphragm. The visualized right lung base is clear. No intra-abdominal free air or free fluid. Hepatobiliary: No focal liver abnormality is seen. No gallstones, gallbladder wall thickening, or biliary dilatation. Pancreas: Choose loss Spleen: Normal in size without focal abnormality. Adrenals/Urinary Tract: The adrenal glands are unremarkable. 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 Nonobstructing stone in the upper pole of the left kidney measures approximately 9 mm. There is a 2 mm  nonobstructing left renal inferior pole calculus. There is mild left hydronephrosis and hydroureter. No obstructing stone identified. Several nonobstructing right renal calculi measure up to 5 mm in the inferior pole of the right kidney. There is no hydronephrosis on the right. The right ureter appears unremarkable. There is trabeculated appearance of the bladder wall. Stomach/Bowel: There is no bowel obstruction or active inflammation. There is a 3 cm diverticulum of the gastric fundus. No active inflammatory changes. The appendix is normal. Vascular/Lymphatic: Mild aortoiliac atherosclerotic disease. The IVC is unremarkable no venous  gas. There is no adenopathy. Reproductive: The uterus is retroverted. Calcified uterine fibroid noted. No adnexal masses. Other: None Musculoskeletal: Osteopenia with scoliosis and degenerative changes of the spine. Old L1 and L4 compression fractures. No acute osseous pathology. IMPRESSION: 1. 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 No obstructing stone identified. Correlation with urinalysis recommended to exclude UTI. 2. Bilateral nonobstructing renal calculi. No hydronephrosis on the right. 3. No bowel obstruction. Normal appendix. 4. Aortic Atherosclerosis (ICD10-I70.0). Electronically Signed   By: Anner Crete M.D.   On: 04/17/2021 22:54    Microbiology: Recent Results (from the past 240 hour(s))  Resp Panel by RT-PCR (Flu A&B, Covid) Nasopharyngeal Swab     Status: None   Collection Time: 04/17/21  3:52 PM   Specimen: Nasopharyngeal Swab; Nasopharyngeal(NP) swabs in vial transport medium  Result Value Ref Range Status   SARS Coronavirus 2 by RT PCR NEGATIVE NEGATIVE Final    Comment: (NOTE) SARS-CoV-2 target nucleic acids are NOT DETECTED.  The SARS-CoV-2 RNA is generally detectable in upper respiratory specimens during the acute phase of infection. The lowest concentration of SARS-CoV-2 viral copies this assay can detect is 138 copies/mL. A negative result does not preclude SARS-Cov-2 infection and should not be used as the sole basis for treatment or other patient management decisions. A negative result may occur with  improper specimen collection/handling, submission of specimen other than nasopharyngeal swab, presence of viral mutation(s) within the areas targeted by this assay, and inadequate number of viral copies(<138 copies/mL). A negative result must be combined with clinical observations, patient history, and epidemiological information. The expected result is Negative.  Fact Sheet for Patients:   EntrepreneurPulse.com.au  Fact Sheet for Healthcare Providers:  IncredibleEmployment.be  This test is no t yet approved or cleared by the [place] FDA and  has been authorized for detection and/or diagnosis of SARS-CoV-2 by FDA under an Emergency Use Authorization (EUA). This EUA will remain  in effect (meaning this test can be used) for the duration of the COVID-19 declaration under Section 564(b)(1) of the Act, 21 U.S.C.section 360bbb-3(b)(1), unless the authorization is terminated  or revoked sooner.       Influenza A by PCR NEGATIVE NEGATIVE Final   Influenza B by PCR NEGATIVE NEGATIVE Final    Comment: (NOTE) The Xpert Xpress SARS-CoV-2/FLU/RSV plus assay is intended as an aid in the diagnosis of influenza from Nasopharyngeal swab specimens and should not be used as a sole basis for treatment. Nasal washings and aspirates are unacceptable for Xpert Xpress SARS-CoV-2/FLU/RSV testing.  Fact Sheet for Patients: EntrepreneurPulse.com.au  Fact Sheet for Healthcare Providers: IncredibleEmployment.be  This test is not yet approved or cleared by the [place] FDA and has been authorized for detection and/or diagnosis of SARS-CoV-2 by FDA under an Emergency Use Authorization (EUA). This EUA will remain in effect (meaning this test can be used) for the duration of the COVID-19 declaration under Section  564(b)(1) of the Act, 21 U.S.C. section 360bbb-3(b)(1), unless the authorization is terminated or revoked.  Performed at [facility], [place] [address], [place], [place] [address]   Urine Culture     Status: Abnormal   Collection Time: 04/19/21  7:41 AM   Specimen: Urine, Clean Catch  Result Value Ref Range Status   Specimen Description URINE, CLEAN CATCH  Final   Special Requests NONE  Final   Culture (A)  Final    <10,000 COLONIES/mL INSIGNIFICANT GROWTH Performed at [facility], [place] [address], [place], [place] [address]    Report Status 04/20/2021 FINAL  Final  MRSA Next Gen by PCR, Nasal     Status: Abnormal   Collection Time: 04/19/21 10:09 AM   Specimen: Nasal Mucosa; Nasal Swab  Result Value Ref Range Status   MRSA by PCR Next Gen DETECTED (A) NOT DETECTED Final    Comment: RESULT CALLED TO, READ BACK BY AND VERIFIED WITH: J. NARAMBAS RN, AT 1204 04/19/21 D. VANHOOK (NOTE) The GeneXpert MRSA Assay (FDA approved for NASAL specimens only), is one component of a comprehensive MRSA colonization surveillance program. It is not intended to diagnose MRSA infection nor to guide or monitor treatment for MRSA infections. Test performance is not FDA approved in patients less than 51 years old. Performed at [facility], [place] [address], [place], [place] [address]      Labs: Basic Metabolic Panel: Recent Labs  Lab 04/17/21 1544 04/18/21 0453 04/19/21 0352 04/21/21 0458 04/22/21 0243  NA 142 142 140 134* 135  K 3.6 3.5 3.5 3.6 3.8  CL 112* 111 111 102 105  CO2 24 22 22 25 23   GLUCOSE 137* 111* 94 83 94  BUN 19 18 14 14 20   CREATININE 1.07* 0.91 0.71 0.92 0.95  CALCIUM 9.5 9.4 9.0 9.1 9.1   Liver Function Tests: Recent Labs  Lab 04/17/21 1544  AST 14*  ALT 10  ALKPHOS 77  BILITOT 0.6  PROT 6.4*  ALBUMIN 3.6   No results for input(s): LIPASE, AMYLASE in the last 168 hours. No results for input(s): AMMONIA in the last 168 hours. CBC: Recent Labs  Lab 04/17/21 1544 04/18/21 0011 04/19/21 0352 04/20/21 0417 04/21/21 0458 04/22/21 0243 04/23/21 0029  WBC 9.2   < > 5.8 6.4 5.7 6.0 6.0  NEUTROABS 7.3  --   --   --   --   --   --   HGB 11.8*   < > 10.2* 10.5* 11.0* 10.0* 10.4*  HCT 38.3   < > 32.1* 33.6* 34.6* 31.0* 32.3*  MCV 95.5   < > 92.0 92.1 92.8 91.4 90.7  PLT 353   < > 270 281 293 259 262   < > = values in this interval not displayed.   Cardiac Enzymes: No results for input(s): CKTOTAL, CKMB, CKMBINDEX, TROPONINI in the last  168 hours. BNP: BNP (last 3 results) Recent Labs    06/12/20 0345  BNP 202.7*    ProBNP (last 3 results) No results for input(s): PROBNP in the last 8760 hours.  CBG: Recent Labs  Lab 04/19/21 1138  GLUCAP 101*       Signed:  Domenic Polite MD.  [facility] 04/24/2021, 1:29 PM

## 2021-04-25 ENCOUNTER — Telehealth: Payer: Self-pay

## 2021-04-25 ENCOUNTER — Other Ambulatory Visit (HOSPITAL_COMMUNITY): Payer: Self-pay

## 2021-04-25 NOTE — Telephone Encounter (Signed)
Transition Care Management Unsuccessful Follow-up Telephone Call  Date of discharge and from where:  04/23/2021 Valerie [name]  Attempts:  1st Attempt  Reason for unsuccessful TCM follow-up call:  Unable to leave message

## 2021-04-26 ENCOUNTER — Telehealth: Payer: Medicare PPO

## 2021-04-26 ENCOUNTER — Other Ambulatory Visit (HOSPITAL_COMMUNITY): Payer: Self-pay

## 2021-04-26 ENCOUNTER — Telehealth: Payer: Self-pay

## 2021-04-26 NOTE — Telephone Encounter (Signed)
  Care Management   Follow Up Note   04/26/2021 Name: Valerie [name] MRN: [id number] DOB: 1946-01-08   Referred by: [name], MD Reason for referral : Chronic Care Management (RN CM Follow up call )   An unsuccessful telephone outreach was attempted today. The patient was referred to the case management team for assistance with care management and care coordination.   Follow Up Plan: Telephone follow up appointment with care management team member scheduled for: 05/19/21  Barb Merino, RN, BSN, CCM Care Management Coordinator [place] [facility]  Direct Phone: [phone]

## 2021-04-26 NOTE — Telephone Encounter (Signed)
Transition Care Management Unsuccessful Follow-up Telephone Call  Date of discharge and from where:  04/23/2021 Valerie [name]  Attempts:  2nd Attempt  Reason for unsuccessful TCM follow-up call:  Unable to leave message

## 2021-05-01 ENCOUNTER — Telehealth: Payer: Self-pay

## 2021-05-01 NOTE — Telephone Encounter (Signed)
Attempted phone call, pts vm full. Provider wanted [place] to reach out, check in to see how she is doing overall.

## 2021-05-08 ENCOUNTER — Telehealth: Payer: Self-pay

## 2021-05-11 ENCOUNTER — Encounter: Payer: Self-pay | Admitting: Internal Medicine

## 2021-05-11 ENCOUNTER — Ambulatory Visit (INDEPENDENT_AMBULATORY_CARE_PROVIDER_SITE_OTHER): Payer: Medicare PPO | Admitting: Internal Medicine

## 2021-05-11 ENCOUNTER — Other Ambulatory Visit: Payer: Self-pay

## 2021-05-11 DIAGNOSIS — I480 Paroxysmal atrial fibrillation: Secondary | ICD-10-CM | POA: Diagnosis not present

## 2021-05-11 DIAGNOSIS — C799 Secondary malignant neoplasm of unspecified site: Secondary | ICD-10-CM | POA: Diagnosis not present

## 2021-05-11 DIAGNOSIS — E440 Moderate protein-calorie malnutrition: Secondary | ICD-10-CM | POA: Diagnosis not present

## 2021-05-11 DIAGNOSIS — N390 Urinary tract infection, site not specified: Secondary | ICD-10-CM | POA: Diagnosis not present

## 2021-05-11 DIAGNOSIS — I700 Atherosclerosis of aorta: Secondary | ICD-10-CM | POA: Diagnosis not present

## 2021-05-11 DIAGNOSIS — R310 Gross hematuria: Secondary | ICD-10-CM | POA: Diagnosis not present

## 2021-05-11 DIAGNOSIS — R269 Unspecified abnormalities of gait and mobility: Secondary | ICD-10-CM

## 2021-05-11 NOTE — Progress Notes (Signed)
Virtual Visit via Phone   This visit type was conducted due to national recommendations for restrictions regarding the COVID-19 Pandemic (e.g. social distancing) in an effort to limit this patient's exposure and mitigate transmission in our community.  Due to her co-morbid illnesses, this patient is at least at moderate risk for complications without adequate follow up.  This format is felt to be most appropriate for this patient at this time.  All issues noted in this document were discussed and addressed.  A limited physical exam was performed with this format.    This visit type was conducted due to national recommendations for restrictions regarding the COVID-19 Pandemic (e.g. social distancing) in an effort to limit this patient's exposure and mitigate transmission in our community.  Patients identity confirmed using two different identifiers.  This format is felt to be most appropriate for this patient at this time.  All issues noted in this document were discussed and addressed.  No physical exam was performed (except for noted visual exam findings with Video Visits).    Identity of patient confirmed with two identifiers. Husband is present for the visit.   Date:  05/28/2021   ID:  Valerie [name], Valerie [name] [birth year], MRN [id number]  Patient Location:  Home, accompanied by her husband  Provider location:   Office    Chief Complaint:  "I need to have physical therapy"  History of Present Illness:    Valerie [name] is a 75 y.o. female who presents via video conferencing for a telehealth visit today.    The patient does not have symptoms concerning for COVID-19 infection (fever, chills, cough, or new shortness of breath).    She presents today for virtual visit. She prefers this method of visit. She requests her husband be brought in during latter half of visit.   She was recently admitted n 8/29- 9/6 for hematuria. She also has h/o recurrent UTI. Anticoaguated w/ Xarelto  due to h/o afib. Patient started empirically on meropenem IV on admission.    Past Medical History:  Diagnosis Date   Abnormal Pap smear 2006   Anal fistula    Ankle fracture    Anxiety    Arthritis    Atrophic vaginitis 2008   Cataract    Dementia ([place]) 2009   Dyspareunia 2008   H/O osteoporosis    H/O varicella    H/O vitamin D deficiency    Headache 07/24/2016   Heart murmur    History of measles, mumps, or rubella    History of radiation therapy 07/28/13- 08/10/13   right lung metastasis 5000 cGy 10 sessions   Hypertension    Lung cancer ([place]) dx'd 2002   Lung cancer ([place])    Lung cancer ([place])    Lung metastasis ([place])    PET scan 05/05/13, RUL lung nodule   Metastasis to brain [facility]) dx'd 2008   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   Nodule of right lung CT- 06/03/12   RIGHT UPPER LOBE   Nodule of right lung 06/03/12   Upper Lobe   On antineoplastic chemotherapy    TARCEVA   Osteoporosis 2010   Primary cancer of right upper lobe of lung ([place]) 04/22/2009   Qualifier: Diagnosis of  By: Nils Pyle CMA (AAMA), Leisha     Shortness of breath    hx lung ca    Status post chemotherapy 2003   CARBOPLATIN/PACLITAXEL /STATUS POST CLINICAL TRAIL OF CELEBREX AND IRESSA AT [facility] FOR 1 YEAR  Status post radiation therapy 2003   LEFT LUNG   Status post radiation therapy 11/07/2005   WHOLE BRAIN: DR Larkin [facility]   Status post radiation therapy 06/02/2008   GAMMA KNIFE OF RESECTED CAVITAY   Thyroid adenoma    ?   Thyroid cancer ([place]) 10/18/11 bx   adenoid nodules    Yeast infection    Past Surgical History:  Procedure Laterality Date   BRAIN SURGERY     INTRAVASCULAR ULTRASOUND/IVUS N/A 02/16/2020   Procedure: Intravascular Ultrasound/IVUS;  Surgeon: Leonie Man, MD;  Location: [place] [facility];  Service: Cardiovascular;  Laterality: N/A;   LEFT LOWER LOBECTOMY  05/2001   LEFT OCCIPITAL CRANIOTOMY  09/21/2005   tumor   LUNG LOBECTOMY     NECK SURGERY     ORIF ANKLE  FRACTURE Left 10/02/2014   Procedure: OPEN REDUCTION INTERNAL FIXATION (ORIF) ANKLE FRACTURE;  Surgeon: Alta Corning, MD;  [facility];  Service: Orthopedics;  Laterality: Left;   ORIF FEMUR FRACTURE Right 06/11/2019   Procedure: OPEN REDUCTION INTERNAL FIXATION (ORIF) DISTAL FEMUR FRACTURE;  Surgeon: Renette Butters, MD;  Location: [place];  Service: Orthopedics;  Laterality: Right;   ORIF TIBIA PLATEAU Right 06/11/2019   Procedure: Open Reduction Internal Fixation (Orif) Tibial Plateau;  Surgeon: Renette Butters, MD;  Location: [place];  Service: Orthopedics;  Laterality: Right;   RADICAL NECK DISSECTION  10/18/2011   Procedure: RADICAL NECK DISSECTION;  Surgeon: Izora Gala, MD;  Location: [place];  Service: ENT;  Laterality: Right;  RIGHT MODIFIED NECK DISSECTION /POSSIBLE RIGHT THYROIDECTOM   RIGHT/LEFT HEART CATH AND CORONARY ANGIOGRAPHY N/A 02/16/2020   Procedure: RIGHT/LEFT HEART CATH AND CORONARY ANGIOGRAPHY;  Surgeon: Leonie Man, MD;  Location: [place] [facility];  Service: Cardiovascular;  Laterality: N/A;   TEE WITHOUT CARDIOVERSION N/A 03/28/2020   Procedure: TRANSESOPHAGEAL ECHOCARDIOGRAM (TEE);  Surgeon: Buford Dresser, MD;  Location: [facility] ENDOSCOPY;  Service: Cardiovascular;  Laterality: N/A;   THYROIDECTOMY  10/18/2011   Procedure: THYROIDECTOMY WITH RADICAL NECK DISSECTION;  Surgeon: Izora Gala, MD;  Location: [place];  Service: ENT;  Laterality: Right;     Current Meds  Medication Sig   Ascorbic Acid (VITAMIN C) 100 MG tablet Take 100 mg by mouth every evening. Gummie   cetaphil (CETAPHIL) lotion Apply 1 application topically daily as needed for dry skin.   Cholecalciferol (D3 PO) Take 1 capsule by mouth daily.   donepezil (ARICEPT) 10 MG tablet TAKE 1 TABLET BY MOUTH ONCE DAILY IN THE EVENING (Patient taking differently: Take 10 mg by mouth at bedtime.)   erlotinib (TARCEVA) 100 MG tablet TAKE 1 TABLET (100 MG TOTAL) BY MOUTH DAILY. TAKE ON AN EMPTY STOMACH 1 HOUR  BEFORE MEALS OR 2 HOURS AFTER (Patient taking differently: Take 100 mg by mouth daily.)   loperamide (IMODIUM A-D) 2 MG tablet Take 4 mg by mouth 3 (three) times daily as needed for diarrhea or loose stools.   MAGNESIUM PO Take 200 mg by mouth daily.   Multiple Vitamin (MULTI-VITAMINS) TABS Take 1 tablet by mouth every evening.    prochlorperazine (COMPAZINE) 10 MG tablet Take 1 tablet (10 mg total) by mouth every 6 (six) hours as needed for nausea or vomiting.   vitamin B-12 (CYANOCOBALAMIN) 1000 MCG tablet Take 1,000 mcg by mouth every evening.   [DISCONTINUED] diphenhydrAMINE (BENADRYL) 25 MG tablet Take 25 mg by mouth every 6 (six) hours as needed for allergies.     Allergies:   Sulfa  antibiotics   Social History   Tobacco Use   Smoking status: Never   Smokeless tobacco: Never   Tobacco comments:    smoked few years in college  Vaping Use   Vaping Use: Never used  Substance Use Topics   Alcohol use: No   Drug use: No     Family Hx: The patient's family history includes Cancer in her father, maternal aunt, and mother; Gait disorder in her mother; Heart failure in her sister.  ROS:   Please see the history of present illness.    Review of Systems  Constitutional: Negative.   Respiratory: Negative.    Cardiovascular: Negative.   Gastrointestinal: Negative.   Neurological:  Positive for focal weakness.  Psychiatric/Behavioral: Negative.     All other systems reviewed and are negative.   Labs/Other Tests and Data Reviewed:    Recent Labs: 06/12/2020: B Natriuretic Peptide 202.7 06/13/2020: Magnesium 2.0 05/17/2021: ALT 8; BUN 18; Creatinine 0.90; Hemoglobin 11.8; Platelet Count 324; Potassium 3.9; Sodium 142   Recent Lipid Panel Lab Results  Component Value Date/Time   CHOL 189 04/20/2020 12:00 AM   CHOL 263 (H) 10/16/2018 01:00 PM   TRIG 130 04/20/2020 12:00 AM   HDL 69 04/20/2020 12:00 AM   HDL 90 10/16/2018 01:00 PM   CHOLHDL 3.6 02/15/2020 05:50 AM   LDLCALC  97 04/20/2020 12:00 AM   LDLCALC 143 (H) 10/16/2018 01:00 PM    Wt Readings from Last 3 Encounters:  04/22/21 155 lb 6.8 oz (70.5 kg)  09/14/20 160 lb (72.6 kg)  08/24/20 158 lb (71.7 kg)     Exam:    Vital Signs:  There were no vitals taken for this visit.   PE not performed, phone visit.    ASSESSMENT & PLAN:    1. Recurrent UTI DISCHARGE SUMMARY WAS REVIEWED IN FULL DETAIL DURING THE VISIT. MEDS RECONCILED AND COMPARED TO DISCHARGE MEDS. MEDICATION LIST WAS UPDATED AND REVIEWED WITH THE PATIENT. GREATER THAN 50% FACE TO FACE TIME WAS SPENT IN COUNSELING AND COORDINATION OF CARE. ALL QUESTIONS WERE ANSWERED TO THE SATISFACTION OF THE PATIENT. I will refer her to [facility] for f/u - I will recheck urinalysis.  - Ambulatory referral to Home Health  2. Gross hematuria Comments: Please see #1.   3. Gait abnormality Comments: I will refer her to home health PT as requested.  - Ambulatory referral to [place]  4. Atherosclerosis of aorta ([place]) Comments: Chronic, seen on CT abd/pelvis in July 2022.  She is encouraged to follow heart healthy diet.  5. Malnutrition of moderate degree (HCC) Comments: I will check prealbumin level.  - Prealbumin; Future  6. Paroxysmal atrial fibrillation (HCC) Comments: Due to recurrent hematuria, Xarelto is on hold for now.  7. Secondary malignant neoplasm of unspecified site (CODE) ([place]) Comments: Chronic, as per Oncology.    COVID-19 Education: The signs and symptoms of COVID-19 were discussed with the patient and how to seek care for testing (follow up with PCP or arrange E-visit).  The importance of social distancing was discussed today.  Patient Risk:   After full review of this patients clinical status, I feel that they are at least moderate risk at this time.  Time:   Today, I have spent 15 minutes/ seconds with the patient with telehealth technology discussing above diagnoses.     Medication Adjustments/Labs and Tests  Ordered: Current medicines are reviewed at length with the patient today.  Concerns regarding medicines are outlined above.  Tests Ordered: Orders Placed This Encounter  Procedures   Prealbumin   Ambulatory referral to Home Health     Medication Changes: No orders of the defined types were placed in this encounter.   Disposition:  Follow up prn  Signed, [name], MD

## 2021-05-17 ENCOUNTER — Inpatient Hospital Stay: Payer: Medicare PPO

## 2021-05-17 ENCOUNTER — Other Ambulatory Visit: Payer: Self-pay

## 2021-05-17 ENCOUNTER — Other Ambulatory Visit: Payer: Medicare PPO

## 2021-05-17 ENCOUNTER — Inpatient Hospital Stay: Payer: Medicare PPO | Attending: Internal Medicine | Admitting: Internal Medicine

## 2021-05-17 VITALS — BP 117/66 | HR 95 | Temp 97.0°F | Resp 17

## 2021-05-17 DIAGNOSIS — C3432 Malignant neoplasm of lower lobe, left bronchus or lung: Secondary | ICD-10-CM | POA: Diagnosis not present

## 2021-05-17 DIAGNOSIS — C7931 Secondary malignant neoplasm of brain: Secondary | ICD-10-CM | POA: Insufficient documentation

## 2021-05-17 DIAGNOSIS — I509 Heart failure, unspecified: Secondary | ICD-10-CM | POA: Insufficient documentation

## 2021-05-17 DIAGNOSIS — Z79899 Other long term (current) drug therapy: Secondary | ICD-10-CM | POA: Diagnosis not present

## 2021-05-17 DIAGNOSIS — Z5111 Encounter for antineoplastic chemotherapy: Secondary | ICD-10-CM

## 2021-05-17 DIAGNOSIS — C3411 Malignant neoplasm of upper lobe, right bronchus or lung: Secondary | ICD-10-CM

## 2021-05-17 DIAGNOSIS — C3490 Malignant neoplasm of unspecified part of unspecified bronchus or lung: Secondary | ICD-10-CM

## 2021-05-17 DIAGNOSIS — Z923 Personal history of irradiation: Secondary | ICD-10-CM | POA: Diagnosis not present

## 2021-05-17 DIAGNOSIS — D649 Anemia, unspecified: Secondary | ICD-10-CM | POA: Insufficient documentation

## 2021-05-17 LAB — CBC WITH DIFFERENTIAL (CANCER CENTER ONLY)
Abs Immature Granulocytes: 0.03 10*3/uL (ref 0.00–0.07)
Basophils Absolute: 0 10*3/uL (ref 0.0–0.1)
Basophils Relative: 0 %
Eosinophils Absolute: 0.1 10*3/uL (ref 0.0–0.5)
Eosinophils Relative: 1 %
HCT: 37.7 % (ref 36.0–46.0)
Hemoglobin: 11.8 g/dL — ABNORMAL LOW (ref 12.0–15.0)
Immature Granulocytes: 0 %
Lymphocytes Relative: 14 %
Lymphs Abs: 1.1 10*3/uL (ref 0.7–4.0)
MCH: 28.9 pg (ref 26.0–34.0)
MCHC: 31.3 g/dL (ref 30.0–36.0)
MCV: 92.4 fL (ref 80.0–100.0)
Monocytes Absolute: 0.6 10*3/uL (ref 0.1–1.0)
Monocytes Relative: 7 %
Neutro Abs: 6.4 10*3/uL (ref 1.7–7.7)
Neutrophils Relative %: 78 %
Platelet Count: 324 10*3/uL (ref 150–400)
RBC: 4.08 MIL/uL (ref 3.87–5.11)
RDW: 14.5 % (ref 11.5–15.5)
WBC Count: 8.3 10*3/uL (ref 4.0–10.5)
nRBC: 0 % (ref 0.0–0.2)

## 2021-05-17 LAB — CMP (CANCER CENTER ONLY)
ALT: 8 U/L (ref 0–44)
AST: 11 U/L — ABNORMAL LOW (ref 15–41)
Albumin: 3.5 g/dL (ref 3.5–5.0)
Alkaline Phosphatase: 107 U/L (ref 38–126)
Anion gap: 13 (ref 5–15)
BUN: 18 mg/dL (ref 8–23)
CO2: 22 mmol/L (ref 22–32)
Calcium: 9.7 mg/dL (ref 8.9–10.3)
Chloride: 107 mmol/L (ref 98–111)
Creatinine: 0.9 mg/dL (ref 0.44–1.00)
GFR, Estimated: >60 mL/min (ref >60)
Glucose, Bld: 89 mg/dL (ref 70–99)
Potassium: 3.9 mmol/L (ref 3.5–5.1)
Sodium: 142 mmol/L (ref 135–145)
Total Bilirubin: 0.6 mg/dL (ref 0.3–1.2)
Total Protein: 7 g/dL (ref 6.5–8.1)

## 2021-05-17 NOTE — Progress Notes (Signed)
[place] [phone]   [phone]  OFFICE PROGRESS NOTE  [name], [address] [place] [address]  PRINCIPAL DIAGNOSIS:  metastatic non-small cell lung cancer, adenocarcinoma with brain metastases initially diagnosed as a stage IIb in October 2002.   BIOMARKERS TESTING ([facility]): Positive for: EGFR E746-A750 del, T790M, CTNNB1 D32G, SMAD4 T154fs*10 Negative for: RET, ALK, BRAF, KRAS, ERBB2 and MET   PRIOR THERAPY:  Status post left lower lobectomy under the care of Dr Arlyce Dice in October 2002. Status post course of concurrent chemoradiation with weekly carboplatin and paclitaxel under the care of Dr Benay Spice and Dr Elba Barman in early 2003. Status post treatment with Celebrex and Iressa according to the clinical trial at [facility] for a total of 1 year. Status post left occipital craniotomy for tumor resection on September 21, 2005, under the care of Dr Christella Noa. Status post whole brain irradiation under the care of Dr Elba Barman, completed November 07, 2005. Status post gamma knife treatment to the resected cavitary recurrence on June 02, 2008, at [facility]. Status post right thyroidectomy with radical neck dissection under the care of Dr. Constance Holster on 10/18/2011. Status post excisional biopsy of right cervical lymph node that was consistent with metastatic adenocarcinoma. Tarceva 150 mg by mouth daily with therapy beginning 06/03/2007, discontinued in July 2015 secondary to disease progression. Status post 79 cycles. palliative radiotherapy to the enlarging right upper lobe lung nodule under the care of Dr. Valere Dross. Treatment at [facility] on STUDY [id number]  300 mg by mouth daily for 21 days discontinued secondary to study deviation and inability for the patient to keep her appointment at [facility] secondary to recent hip fractures. Open reduction and internal fixation of trimalleolar ankle  fracture dislocation with fixation of the fibula as well as medial malleolus. Tagrisso 80 mg by mouth daily started 11/25/2014, status post 62 months of treatment.  This was discontinued on 02/13/2020 secondary to congestive heart failure with ejection fraction of 20-25%.     CURRENT THERAPY: Tarceva 150 mg p.o. daily.  Started 02/26/2020.  This will be reduced to Tarceva 100 mg p.o. daily starting July 01, 2020.  INTERVAL HISTORY: Valerie [name] 75 y.o. female returns to the clinic today for follow-up visit accompanied by her caregiver.  Her husband is currently in the hospital with kidney disorder.  The patient denied having any complaints today except for the generalized weakness and fatigue.  She has no current chest pain, shortness of breath, cough or hemoptysis.  She has no nausea, vomiting, diarrhea or constipation.  She has no skin rash.  She continues to tolerate her treatment with Tarceva fairly well.  She is here today for evaluation and repeat blood work.  MEDICAL HISTORY: Past Medical History:  Diagnosis Date   Abnormal Pap smear 2006   Anal fistula    Ankle fracture    Anxiety    Arthritis    Atrophic vaginitis 2008   Cataract    Dementia ([place]) 2009   Dyspareunia 2008   H/O osteoporosis    H/O varicella    H/O vitamin D deficiency    Headache 07/24/2016   Heart murmur    History of measles, mumps, or rubella    History of radiation therapy 07/28/13- 08/10/13   right lung metastasis 5000 cGy 10 sessions   Hypertension    Lung cancer ([place]) dx'd 2002   Lung cancer ([place])  Lung cancer ([place])    Lung metastasis ([place])    PET scan 05/05/13, RUL lung nodule   Metastasis to brain [facility]) dx'd 2008   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   Nodule of right lung CT- 06/03/12   RIGHT UPPER LOBE   Nodule of right lung 06/03/12   Upper Lobe   On antineoplastic chemotherapy    TARCEVA   Osteoporosis 2010   Primary cancer of right upper lobe of lung ([place]) 04/22/2009    Qualifier: Diagnosis of  By: Nils Pyle CMA (AAMA), Leisha     Shortness of breath    hx lung ca    Status post chemotherapy 2003   CARBOPLATIN/PACLITAXEL /STATUS POST CLINICAL TRAIL OF CELEBREX AND IRESSA AT [facility] FOR 1 YEAR   Status post radiation therapy 2003   LEFT LUNG   Status post radiation therapy 11/07/2005   WHOLE BRAIN: DR Larkin [facility]   Status post radiation therapy 06/02/2008   GAMMA KNIFE OF RESECTED CAVITAY   Thyroid adenoma    ?   Thyroid cancer ([place]) 10/18/11 bx   adenoid nodules    Yeast infection     ALLERGIES:  is allergic to sulfa antibiotics.  MEDICATIONS:  Current Outpatient Medications  Medication Sig Dispense Refill   Ascorbic Acid (VITAMIN C) 100 MG tablet Take 100 mg by mouth every evening. Gummie     cetaphil (CETAPHIL) lotion Apply 1 application topically daily as needed for dry skin.     Cholecalciferol (D3 PO) Take 1 capsule by mouth daily.     donepezil (ARICEPT) 10 MG tablet TAKE 1 TABLET BY MOUTH ONCE DAILY IN THE EVENING (Patient taking differently: Take 10 mg by mouth at bedtime.) 90 tablet 0   erlotinib (TARCEVA) 100 MG tablet TAKE 1 TABLET (100 MG TOTAL) BY MOUTH DAILY. TAKE ON AN EMPTY STOMACH 1 HOUR BEFORE MEALS OR 2 HOURS AFTER (Patient taking differently: Take 100 mg by mouth daily.) 30 tablet 2   loperamide (IMODIUM A-D) 2 MG tablet Take 4 mg by mouth 3 (three) times daily as needed for diarrhea or loose stools.     MAGNESIUM PO Take 200 mg by mouth daily.     Multiple Vitamin (MULTI-VITAMINS) TABS Take 1 tablet by mouth every evening.      prochlorperazine (COMPAZINE) 10 MG tablet Take 1 tablet (10 mg total) by mouth every 6 (six) hours as needed for nausea or vomiting. 30 tablet 0   vitamin B-12 (CYANOCOBALAMIN) 1000 MCG tablet Take 1,000 mcg by mouth every evening.     No current facility-administered medications for this visit.    SURGICAL HISTORY:  Past Surgical History:  Procedure Laterality Date   BRAIN SURGERY     INTRAVASCULAR  ULTRASOUND/IVUS N/A 02/16/2020   Procedure: Intravascular Ultrasound/IVUS;  Surgeon: Leonie Man, MD;  Location: [place] [facility];  Service: Cardiovascular;  Laterality: N/A;   LEFT LOWER LOBECTOMY  05/2001   LEFT OCCIPITAL CRANIOTOMY  09/21/2005   tumor   LUNG LOBECTOMY     NECK SURGERY     ORIF ANKLE FRACTURE Left 10/02/2014   Procedure: OPEN REDUCTION INTERNAL FIXATION (ORIF) ANKLE FRACTURE;  Surgeon: Alta Corning, MD;  [facility];  Service: Orthopedics;  Laterality: Left;   ORIF FEMUR FRACTURE Right 06/11/2019   Procedure: OPEN REDUCTION INTERNAL FIXATION (ORIF) DISTAL FEMUR FRACTURE;  Surgeon: Renette Butters, MD;  Location: [place];  Service: Orthopedics;  Laterality: Right;   ORIF TIBIA PLATEAU Right 06/11/2019   Procedure:  Open Reduction Internal Fixation (Orif) Tibial Plateau;  Surgeon: Renette Butters, MD;  Location: [place];  Service: Orthopedics;  Laterality: Right;   RADICAL NECK DISSECTION  10/18/2011   Procedure: RADICAL NECK DISSECTION;  Surgeon: Izora Gala, MD;  Location: [place];  Service: ENT;  Laterality: Right;  RIGHT MODIFIED NECK DISSECTION /POSSIBLE RIGHT THYROIDECTOM   RIGHT/LEFT HEART CATH AND CORONARY ANGIOGRAPHY N/A 02/16/2020   Procedure: RIGHT/LEFT HEART CATH AND CORONARY ANGIOGRAPHY;  Surgeon: Leonie Man, MD;  Location: [place] [facility];  Service: Cardiovascular;  Laterality: N/A;   TEE WITHOUT CARDIOVERSION N/A 03/28/2020   Procedure: TRANSESOPHAGEAL ECHOCARDIOGRAM (TEE);  Surgeon: Buford Dresser, MD;  [facility] ENDOSCOPY;  Service: Cardiovascular;  Laterality: N/A;   THYROIDECTOMY  10/18/2011   Procedure: THYROIDECTOMY WITH RADICAL NECK DISSECTION;  Surgeon: Izora Gala, MD;  Location: [place];  Service: ENT;  Laterality: Right;    REVIEW OF SYSTEMS:  A comprehensive review of systems was negative except for: Constitutional: positive for fatigue Musculoskeletal: positive for muscle weakness   PHYSICAL EXAMINATION: General appearance:  alert, cooperative, fatigued, and no distress Head: Normocephalic, without obvious abnormality, atraumatic Neck: no adenopathy, supple, symmetrical, trachea midline, and thyroid not enlarged, symmetric, no tenderness/mass/nodules Lymph nodes: Cervical, supraclavicular, and axillary nodes normal. Resp: clear to auscultation bilaterally Back: symmetric, no curvature. ROM normal. No CVA tenderness. Cardio: regular rate and rhythm, S1, S2 normal, no murmur, click, rub or gallop GI: soft, non-tender; bowel sounds normal; no masses,  no organomegaly Extremities: extremities normal, atraumatic, no cyanosis or edema  ECOG PERFORMANCE STATUS: 1 - Symptomatic but completely ambulatory  Blood pressure 117/66, pulse 95, temperature (!) 97 F (36.1 C), temperature source Oral, resp. rate 17, SpO2 100 %.  LABORATORY DATA: Lab Results  Component Value Date   WBC 8.3 05/17/2021   HGB 11.8 (L) 05/17/2021   HCT 37.7 05/17/2021   MCV 92.4 05/17/2021   PLT 324 05/17/2021      Chemistry      Component Value Date/Time   NA 142 05/17/2021 1246   NA 140 04/20/2020 0000   NA 141 08/06/2017 1047   K 3.9 05/17/2021 1246   K 3.9 08/06/2017 1047   CL 107 05/17/2021 1246   CL 106 12/09/2012 0806   CO2 22 05/17/2021 1246   CO2 24 08/06/2017 1047   BUN 18 05/17/2021 1246   BUN 18 04/20/2020 0000   BUN 15.4 08/06/2017 1047   CREATININE 0.90 05/17/2021 1246   CREATININE 1.0 08/06/2017 1047   GLU 89 04/20/2020 0000      Component Value Date/Time   CALCIUM 9.7 05/17/2021 1246   CALCIUM 9.8 08/06/2017 1047   ALKPHOS 107 05/17/2021 1246   ALKPHOS 65 08/06/2017 1047   AST 11 (L) 05/17/2021 1246   AST 11 08/06/2017 1047   ALT 8 05/17/2021 1246   ALT 9 08/06/2017 1047   BILITOT 0.6 05/17/2021 1246   BILITOT 0.66 08/06/2017 1047       RADIOGRAPHIC STUDIES: CT RENAL STONE STUDY  Result Date: 04/17/2021 CLINICAL DATA:  Flank pain.  Concern for kidney stone. EXAM: CT ABDOMEN AND PELVIS WITHOUT  CONTRAST TECHNIQUE: Multidetector CT imaging of the abdomen and pelvis was performed following the standard protocol without IV contrast. COMPARISON:  CT dated 02/23/2021. FINDINGS: Evaluation of this exam is limited in the absence of intravenous contrast. Lower chest: There is eventration of the left hemidiaphragm. The visualized right lung base is clear. No intra-abdominal free air or free fluid. Hepatobiliary: No focal  liver abnormality is seen. No gallstones, gallbladder wall thickening, or biliary dilatation. Pancreas: Choose loss Spleen: Normal in size without focal abnormality. Adrenals/Urinary Tract: The adrenal glands are unremarkable. 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 Nonobstructing stone in the upper pole of the left kidney measures approximately 9 mm. There is a 2 mm nonobstructing left renal inferior pole calculus. There is mild left hydronephrosis and hydroureter. No obstructing stone identified. Several nonobstructing right renal calculi measure up to 5 mm in the inferior pole of the right kidney. There is no hydronephrosis on the right. The right ureter appears unremarkable. There is trabeculated appearance of the bladder wall. Stomach/Bowel: There is no bowel obstruction or active inflammation. There is a 3 cm diverticulum of the gastric fundus. No active inflammatory changes. The appendix is normal. Vascular/Lymphatic: Mild aortoiliac atherosclerotic disease. The IVC is unremarkable no venous gas. There is no adenopathy. Reproductive: The uterus is retroverted. Calcified uterine fibroid noted. No adnexal masses. Other: None Musculoskeletal: Osteopenia with scoliosis and degenerative changes of the spine. Old L1 and L4 compression fractures. No acute osseous pathology. IMPRESSION: 1. 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 No obstructing stone identified. Correlation with urinalysis recommended to exclude  UTI. 2. Bilateral nonobstructing renal calculi. No hydronephrosis on the right. 3. No bowel obstruction. Normal appendix. 4. Aortic Atherosclerosis (ICD10-I70.0). Electronically Signed   By: Anner Crete M.D.   On: 04/17/2021 22:54    ASSESSMENT AND PLAN: This is a very pleasant 75 years old African-American female with metastatic non-small cell lung cancer, adenocarcinoma with positive EGFR mutation status post several years of treatment with Tarceva and the patient developed positive resistant mutation, T790M. She has been on treatment with Tagrisso for the last 5 years, status post 62 months of treatment.  She has been tolerating her treatment with Tagrisso well but she was recently admitted to [facility] with shortness of breath and she was diagnosed with congestive heart failure secondary to cardiomyopathy with ejection fraction of 20-25%.  This is likely secondary to her treatment with Tagrisso. I discontinued her treatment with Tagrisso on 02/13/2020. The patient is currently on treatment with Tarceva 150 mg p.o. daily started 4 months ago with some interruption because of her hospitalization. The patient resumed her treatment with Tarceva at a dose of 100 mg p.o. daily status post 10 months of treatment.  The patient continues to tolerate her treatment with Tarceva fairly well with no significant adverse effects. I recommended for her to continue her current treatment with Tarceva with the same dose. Her blood work today is unremarkable except for mild anemia. I will see her back for follow-up visit in 3 months for evaluation and repeat CT scan of the chest, abdomen pelvis for restaging of her disease.  The patient voices understanding of current disease status and treatment options and is in agreement with the current care plan. All questions were answered. The patient knows to call the clinic with any problems, questions or concerns. We can certainly see the patient much sooner if  necessary.  Disclaimer: This note was dictated with voice recognition software. Similar sounding words can inadvertently be transcribed and may not be corrected upon review.

## 2021-05-18 NOTE — Telephone Encounter (Signed)
Error

## 2021-05-19 ENCOUNTER — Ambulatory Visit (INDEPENDENT_AMBULATORY_CARE_PROVIDER_SITE_OTHER): Payer: Medicare PPO

## 2021-05-19 ENCOUNTER — Telehealth: Payer: Medicare PPO

## 2021-05-19 DIAGNOSIS — R5381 Other malaise: Secondary | ICD-10-CM

## 2021-05-19 DIAGNOSIS — R269 Unspecified abnormalities of gait and mobility: Secondary | ICD-10-CM

## 2021-05-19 DIAGNOSIS — C3411 Malignant neoplasm of upper lobe, right bronchus or lung: Secondary | ICD-10-CM

## 2021-05-19 DIAGNOSIS — F03A0 Unspecified dementia, mild, without behavioral disturbance, psychotic disturbance, mood disturbance, and anxiety: Secondary | ICD-10-CM

## 2021-05-19 DIAGNOSIS — N390 Urinary tract infection, site not specified: Secondary | ICD-10-CM

## 2021-05-23 ENCOUNTER — Telehealth: Payer: Medicare PPO

## 2021-05-23 ENCOUNTER — Telehealth: Payer: Self-pay

## 2021-05-23 NOTE — Telephone Encounter (Signed)
  Care Management   Follow Up Note   05/23/2021 Name: Valerie [name] MRN: [id number] DOB: 10/26/1945   Referred by: [name], MD Reason for referral : Chronic Care Management (Unsuccessful call)   An unsuccessful telephone outreach was attempted today. The patient was referred to the case management team for assistance with care management and care coordination. SW unable to leave a voice message due to voice mailbox being full.  Follow Up Plan: The care management team will reach out to the patient again over the next 21 days.   [name], BSW, CDP Social Worker, Certified Dementia Practitioner [place] [facility] [phone]

## 2021-05-24 NOTE — Chronic Care Management (AMB) (Signed)
Chronic Care Management   CCM RN Visit Note  05/19/2021 Name: LETANYA FROH MRN: [id number] DOB: 04-Jul-1946  Subjective: Valerie [name] is a 75 y.o. year old female who is a primary care patient of [name], MD. The care management team was consulted for assistance with disease management and care coordination needs.    Engaged with patient by telephone for follow up visit in response to provider referral for case management and/or care coordination services.   Consent to Services:  The patient was given information about Chronic Care Management services, agreed to services, and gave verbal consent prior to initiation of services.  Please see initial visit note for detailed documentation.   Patient agreed to services and verbal consent obtained.   Assessment: Review of patient past medical history, allergies, medications, health status, including review of consultants reports, laboratory and other test data, was performed as part of comprehensive evaluation and provision of chronic care management services.   SDOH (Social Determinants of Health) assessments and interventions performed:  Yes, no acute needs   CCM Care Plan  Allergies  Allergen Reactions   Sulfa Antibiotics Other (See Comments)    She cant remember  what happens    Outpatient Encounter Medications as of 05/19/2021  Medication Sig   Ascorbic Acid (VITAMIN C) 100 MG tablet Take 100 mg by mouth every evening. Gummie   cetaphil (CETAPHIL) lotion Apply 1 application topically daily as needed for dry skin.   Cholecalciferol (D3 PO) Take 1 capsule by mouth daily.   donepezil (ARICEPT) 10 MG tablet TAKE 1 TABLET BY MOUTH ONCE DAILY IN THE EVENING (Patient taking differently: Take 10 mg by mouth at bedtime.)   erlotinib (TARCEVA) 100 MG tablet TAKE 1 TABLET (100 MG TOTAL) BY MOUTH DAILY. TAKE ON AN EMPTY STOMACH 1 HOUR BEFORE MEALS OR 2 HOURS AFTER (Patient taking differently: Take 100 mg by mouth daily.)    loperamide (IMODIUM A-D) 2 MG tablet Take 4 mg by mouth 3 (three) times daily as needed for diarrhea or loose stools.   MAGNESIUM PO Take 200 mg by mouth daily.   Multiple Vitamin (MULTI-VITAMINS) TABS Take 1 tablet by mouth every evening.    prochlorperazine (COMPAZINE) 10 MG tablet Take 1 tablet (10 mg total) by mouth every 6 (six) hours as needed for nausea or vomiting.   vitamin B-12 (CYANOCOBALAMIN) 1000 MCG tablet Take 1,000 mcg by mouth every evening.   No facility-administered encounter medications on file as of 05/19/2021.    Patient Active Problem List   Diagnosis Date Noted   Atherosclerosis of aorta ([place]) 15/83/0940   Complicated UTI (urinary tract infection) 04/17/2021   Malnutrition of moderate degree 06/13/2020   Rash and nonspecific skin eruption    Metastatic malignant neoplasm (HCC)    Failure to thrive in adult    Asymptomatic bacteriuria    Pressure injury of skin 06/03/2020   Cellulitis 06/03/2020   SVT (supraventricular tachycardia) ([place]) 03/26/2020   Normocytic anemia 03/24/2020   Urinary tract infection without hematuria    AMS (altered mental status) 03/23/2020   Hypokalemia 03/23/2020   Prolonged Q-T interval on ECG 03/23/2020   DNR (do not resuscitate) 03/23/2020   MRSA bacteremia 03/23/2020   Acute combined systolic and diastolic heart failure (HCC)    Elevated troponin    Dilated cardiomyopathy ([place])    Coronary artery disease involving native coronary artery of native heart without angina pectoris    Congestive heart failure (CHF) ([place]) 02/14/2020   Pyelonephritis 09/10/2019  Acute blood loss anemia 06/12/2019   Closed fracture of distal end of right femur, initial encounter ([place]) 06/11/2019   Closed fracture of lateral portion of right tibial plateau 06/11/2019   History of DVT of lower extremity 06/11/2019   Blurry vision 01/06/2019   Headache 07/24/2016   Cough 08/24/2015   Chronic diarrhea 08/24/2015   Sepsis ([place])    Encounter for  antineoplastic chemotherapy 01/31/2015   Urinary retention 10/20/2014   Ileus ([place]) 10/20/2014   Cellulitis of leg, right 10/20/2014   Fracture of right proximal fibula    Ankle fracture 10/02/2014   Ankle fracture 10/02/2014   Lung cancer ([place]) 10/02/2014   Closed fibular fracture 10/02/2014   Dementia ([place]) 10/02/2014   Lower urinary tract infectious disease 10/02/2014   Reactive airway disease 10/02/2014   Fall 10/02/2014   Closed left ankle fracture 10/01/2014   Right fibular fracture 10/01/2014   Lung metastasis ([place])    LESION, ANUS 04/26/2009   DIARRHEA 04/26/2009   Primary cancer of right upper lobe of lung ([place]) 04/22/2009   Malignant neoplasm of brain ([place]) 04/22/2009   VITAMIN D DEFICIENCY 04/22/2009   ANAL FISTULA 04/22/2009   OSTEOPOROSIS 04/22/2009    Conditions to be addressed/monitored: Malignant Neoplasm of upper lobe of right lung; Dementia w/o behavior disturbance, unspecified dementia type, Gait abnormality, Debility, Recurrent UTI  Care Plan : Chronic Kidney (Adult)  Updates made by Lynne Logan, RN since 05/19/2021 12:00 AM     Problem: Disease Progression   Priority: Medium     Long-Range Goal: Disease Progression Prevented or Minimized   Start Date: 11/17/2020  Expected End Date: 11/17/2021  Recent Progress: On track  Priority: Medium  Note:   Objective:   Lab Results  Component Value Date   CREATININE 0.90 05/17/2021   CREATININE 0.95 04/22/2021   CREATININE 0.92 04/21/2021   Lab Results  Component Value Date   EGFR >60 08/06/2017  Current Barriers:  Ineffective Self Health Maintenance  Unable to perform ADLs independently Unable to perform IADLs independently Clinical Goal(s):  Collaboration with [name], MD regarding development and update of comprehensive plan of care as evidenced by provider attestation and co-signature Inter-disciplinary care team collaboration (see longitudinal plan of care) patient will work with care  management team to address care coordination and chronic disease management needs related to Disease Management Educational Needs Care Coordination Medication Management and Education Medication Reconciliation Psychosocial Support   Interventions:  05/19/21 completed successful outbound call with spouse [name]   Evaluation of current treatment plan related to  CKD  , self-management and patient's adherence to plan as established by provider. Collaboration with [name], MD regarding development and update of comprehensive plan of care as evidenced by provider attestation       and co-signature Inter-disciplinary care team collaboration (see longitudinal plan of care) Provided education to patient about basic disease process related to Chronic Kidney disease   Review of patient status, including review of consultants reports, relevant laboratory and other test results, and medications completed. Reviewed medications with patient and discussed importance of medication adherence Educated spouse with rationale re: importance to increase water to 64 oz daily; keep BP under good control per parameters set by MD  Mailed printed educational materials related to Eating Right with Chronic Kidney disease; Stages of Chronic Kidney disease  Discussed plans with patient for ongoing care management follow up and provided patient with direct contact information for care management team Self Care Activities:   Continue to  keep all scheduled follow up appointments Take medications as directed  Let your healthcare team know if you are unable to take your medications Call your pharmacy for refills at least 7 days prior to running out of medication Patient Goals: - increase water to 64 oz daily  - keep BP under good control per parameters set MD   Follow Up Plan: Telephone follow up appointment with care management team member scheduled for: 08/23/21     Care Plan : Oral Chemotherapy Adherence (Adult)   Updates made by Lynne Logan, RN since 05/19/2021 12:00 AM     Problem: Oral Chemotherapy Adherence   Priority: Medium     Long-Range Goal: Optimal Adherence to Oral Chemotherapy   Start Date: 11/17/2020  Expected End Date: 11/17/2021  Recent Progress: On track  Priority: Medium  Note:   Current Barriers:  Ineffective Self Health Maintenance  Unable to perform ADLs independently Unable to perform IADLs independently Clinical Goal(s):  Collaboration with [name], MD regarding development and update of comprehensive plan of care as evidenced by provider attestation and co-signature Inter-disciplinary care team collaboration (see longitudinal plan of care) patient will work with care management team to address care coordination and chronic disease management needs related to Disease Management Educational Needs Care Coordination Medication Management and Education Psychosocial Support Caregiver Stress support   Interventions:  05/19/21 completed successful call with patient  Evaluation of current treatment plan related to  non-small cell lung cancer with mets , self-management and patient's adherence to plan as established by provider. Collaboration with [name], MD regarding development and update of comprehensive plan of care as evidenced by provider attestation       and co-signature Inter-disciplinary care team collaboration (see longitudinal plan of care) Determined patient continues to follow up with Dr. Mckinley Jewel for Oncology needs with the following Assessment/Plan reviewed from 05/17/21:  ASSESSMENT AND PLAN: This is a very pleasant 75 years old African-American female with metastatic non-small cell lung cancer, adenocarcinoma with positive EGFR mutation status post several years of treatment with Tarceva and the patient developed positive resistant mutation, T790M. She has been on treatment with Tagrisso for the last 5 years, status post 62 months of treatment.   She has been tolerating her treatment with Tagrisso well but she was recently admitted to [facility] with shortness of breath and she was diagnosed with congestive heart failure secondary to cardiomyopathy with ejection fraction of 20-25%.  This is likely secondary to her treatment with Tagrisso. I discontinued her treatment with Tagrisso on 02/13/2020. The patient is currently on treatment with Tarceva 150 mg p.o. daily started 4 months ago with some interruption because of her hospitalization. The patient resumed her treatment with Tarceva at a dose of 100 mg p.o. daily status post 10 months of treatment.  The patient continues to tolerate her treatment with Tarceva fairly well with no significant adverse effects. I recommended for her to continue her current treatment with Tarceva with the same dose. Her blood work today is unremarkable except for mild anemia. I will see her back for follow-up visit in 3 months for evaluation and repeat CT scan of the chest, abdomen pelvis for restaging of her disease.   The patient voices understanding of current disease status and treatment options and is in agreement with the current care plan. All questions were answered. The patient knows to call the clinic with any problems, questions or concerns. We can certainly see the patient much sooner if necessary.  Assessed for tolerance to oral therapy including nausea, vomiting, loss of appetite and altered gastrointestinal function; anticipate referral for uncontrolled symptoms.  Determined patient/spouse verbalizes understanding of the prescribed Cancer treatment plan and ongoing treatmet/plan of care Discussed plans with patient for ongoing care management follow up and provided patient with direct contact information for care management team Self Care Activities:  - ask for help if I cannot afford the medicine - call for medicine refill 2 or 3 days before it runs out - call if I am sick and can not  take my medications - call the doctor or nurse before I stop taking medicine - call the doctor or nurse to get help with side effects - keep a list of all the medicines I take; vitamins and herbals too - keep follow-up appointments - use a pillbox to sort medicine - use an alarm clock or phone to remind me to take my medicine Patient Goals: - follow up with Dr. Julien Nordmann in 3 moths for evaluation of Cancer treatment and CT scan of chest, abdomen pelvis for restaging of disease as directed  Follow Up Plan: Telephone follow up appointment with care management team member scheduled for: 08/23/21   Care Plan : Cancer Treatment Phase (Adult)  Updates made by Lynne Logan, RN since 05/19/2021 12:00 AM     Problem: Fatigue   Priority: High     Long-Range Goal: Fatigue Managed   Start Date: 11/17/2020  Expected End Date: 11/17/2021  Recent Progress: On track  Priority: High  Note:   Current Barriers:  Ineffective Self Health Maintenance  Unable to perform ADLs independently Unable to perform IADLs independently Clinical Goal(s):  Collaboration with [name], MD regarding development and update of comprehensive plan of care as evidenced by provider attestation and co-signature Inter-disciplinary care team collaboration (see longitudinal plan of care) patient will work with care management team to address care coordination and chronic disease management needs related to Disease Management Educational Needs Care Coordination Medication Management and Education Psychosocial Support Caregiver Stress support   Interventions:  05/19/21 successful outbound call completed with spouse [name] Evaluation of current treatment plan related to  chronic fatigue , self-management and patient's adherence to plan as established by provider. Collaboration with [name], MD regarding development and update of comprehensive plan of care as evidenced by provider attestation       and  co-signature Inter-disciplinary care team collaboration (see longitudinal plan of care) Provided education to patient about basic disease process related to fatigue Review of patient status, including review of consultants reports, relevant laboratory and other test results, and medications completed. Reviewed medications with patient and discussed importance of medication adherence Educated on importance of using energy conservation techniques in order to help preserve energy Educated on importance of balancing activity with rest and staying well hydrated while eating a well balanced meal and getting plenty of sleep Determined patient's family is assisting with daily exercise and stretching  Discussed plans with patient for ongoing care management follow up and provided patient with direct contact information for care management team Self Care Activities:  - eat healthy - get a least 8 hours of sleep at night - get outdoors every day (weather permitting) - limit daytime naps - take a warm shower or bath before bed - use devices that will help like a cane, sock-puller or reacher - use meditation or relaxation techniques Patient Goals: - balance activity with rest - continue to work with family for help with daily  exercise   Follow Up Plan: Telephone follow up appointment with care management team member scheduled for: 08/23/21     Care Plan : Complicated Urinary Tract Infection  Updates made by Lynne Logan, RN since 05/19/2021 12:00 AM  Completed 22/12/7503   Problem: Complicated Urinary Tract Infection Resolved 05/19/2021  Priority: High     Goal: UTI evaluated and treated Completed 05/19/2021  Start Date: 04/18/2021  Expected End Date: 05/19/2021  Recent Progress: On track  Priority: High  Note:   Current Barriers:  Ineffective Self Health Maintenance in a patient with Malignant Neoplasm of upper lobe of right lung; Dementia w/o behavior disturbance, unspecified dementia type,  Gait abnormality, Debility, Recurrent UTI Dementia  Clinical Goal(s):  Collaboration with [name], MD regarding development and update of comprehensive plan of care as evidenced by provider attestation and co-signature Inter-disciplinary care team collaboration (see longitudinal plan of care) patient will work with care management team to address care coordination and chronic disease management needs related to Disease Management Educational Needs Care Coordination Medication Management and Education Medication Reconciliation Psychosocial Support   Interventions:  Evaluation of current treatment plan related to Malignant Neoplasm of upper lobe of right lung; Dementia w/o behavior disturbance, unspecified dementia type, Gait abnormality, Debility, Recurrent UTI, self-management and patient's adherence to plan as established by provider. Collaboration with [name], MD regarding development and update of comprehensive plan of care as evidenced by provider attestation       and co-signature Inter-disciplinary care team collaboration (see longitudinal plan of care) Determined patient remains to be asymptomatic of infection following her last UTI on 04/17/21 Review of patient status, including review of consultant's reports, relevant laboratory and other test results, and medications completed. Reviewed medications with spouse and discussed importance of medication adherence Determined patient's anticoagulation was discontinued resolving her hematuria  Reinforced importance to increase water to 64 oz daily and to report signs/symptoms of UTI promptly  Self Care Activities with the help of her spouse  Self administers medications as prescribed Attends all scheduled provider appointments Calls pharmacy for medication refills Calls provider office for new concerns or questions Patient Goals: - follow MD recommendations for complicated UTI       Plan:Telephone follow up  appointment with care management team member scheduled for:  08/23/21  Barb Merino, RN, BSN, CCM Care Management Coordinator [place] [facility]  Direct Phone: [phone]

## 2021-05-24 NOTE — Patient Instructions (Signed)
Visit Information  PATIENT GOALS:  Goals Addressed      COMPLETED: Complicated Urinary Tract Infection evaluated and treated       Timeframe:  Short-Term Goal Priority:  High Start Date:  04/18/21                           Expected End Date:  05/19/21      Next scheduled follow up date: 04/26/21  Self Care Activities with the help of her spouse  Self administers medications as prescribed Attends all scheduled provider appointments Calls pharmacy for medication refills Calls provider office for new concerns or questions Patient Goals: - follow MD recommendations for complicated UTI                     Follow My Treatment Plan-Chronic Kidney   On track    Timeframe:  Long-Range Goal Priority:  Medium Start Date:  11/17/20                         Expected End Date:  11/17/21                     Follow Up Date: 08/23/21  Patient Goals: - increase water to 64 oz daily  - keep BP under good control per parameters set MD    Why is this important?   Staying as healthy as you can is very important. This may mean making changes if you smoke, don't exercise or eat poorly.  A healthy lifestyle is an important goal for you.  Following the treatment plan and making changes may be hard.  Try some of these steps to help keep the disease from getting worse.     Notes:      Follow My Treatment Plan-Oral Chemotherapy Adherence   On track    Timeframe:  Long-Range Goal Priority:  High Start Date: 11/17/20                            Expected End Date: 11/17/21                     Follow Up Date: 08/23/21   - follow up with Dr. Julien Nordmann in 3 moths for evaluation of Cancer treatment and CT scan of chest, abdomen pelvis for restaging of disease as directed   Why is this important?   Following your treatment plan will help keep your care on track.  Medicine may be the most important piece of your plan.  There are many reasons why you might want to stop taking medicine. You may get tired of taking  your medicine. You may think medicine costs too much money. You may find the side effects are too much to bear.  Try some of these steps to make following the treatment plan a Lyndel Dancel easier.     Notes:      Manage Fatigue (Tiredness- Cancer Treatment)   On track    Timeframe:  Long-Range Goal Priority:  Low Start Date: 11/17/20                            Expected End Date: 11/17/21                       Follow Up Date: 08/23/21  Self Care Activities:  - eat healthy - get a least 8 hours of sleep at night - get outdoors every day (weather permitting) - limit daytime naps - take a warm shower or bath before bed - use devices that will help like a cane, sock-puller or reacher - use meditation or relaxation techniques Patient Goals: - balance activity with rest - continue to work with family for help with daily exercise  Why is this important?   Cancer treatment and its side effects can drain your energy. It can keep you from doing things you would like to do.  There are many things that you can do to manage fatigue.    Notes:      The patient verbalized understanding of instructions, educational materials, and care plan provided today and declined offer to receive copy of patient instructions, educational materials, and care plan.   Telephone follow up appointment with care management team member scheduled for: 08/23/21  Barb Merino, RN, BSN, CCM Care Management Coordinator [facility] [facility]  Direct Phone: [phone]

## 2021-05-30 ENCOUNTER — Other Ambulatory Visit (HOSPITAL_COMMUNITY): Payer: Self-pay

## 2021-05-30 ENCOUNTER — Other Ambulatory Visit: Payer: Self-pay | Admitting: Internal Medicine

## 2021-05-30 MED ORDER — ERLOTINIB HCL 100 MG PO TABS
ORAL_TABLET | ORAL | 2 refills | Status: DC
Start: 2021-05-30 — End: 2021-08-29
  Filled 2021-05-30: qty 30, 30d supply, fill #0
  Filled 2021-06-21: qty 30, 30d supply, fill #1
  Filled 2021-07-26: qty 30, 30d supply, fill #2

## 2021-05-31 ENCOUNTER — Other Ambulatory Visit (HOSPITAL_COMMUNITY): Payer: Self-pay

## 2021-05-31 DIAGNOSIS — C3411 Malignant neoplasm of upper lobe, right bronchus or lung: Secondary | ICD-10-CM | POA: Diagnosis not present

## 2021-05-31 DIAGNOSIS — D630 Anemia in neoplastic disease: Secondary | ICD-10-CM | POA: Diagnosis not present

## 2021-05-31 DIAGNOSIS — I5041 Acute combined systolic (congestive) and diastolic (congestive) heart failure: Secondary | ICD-10-CM | POA: Diagnosis not present

## 2021-05-31 DIAGNOSIS — I2510 Atherosclerotic heart disease of native coronary artery without angina pectoris: Secondary | ICD-10-CM | POA: Diagnosis not present

## 2021-05-31 DIAGNOSIS — I110 Hypertensive heart disease with heart failure: Secondary | ICD-10-CM | POA: Diagnosis not present

## 2021-05-31 DIAGNOSIS — C7931 Secondary malignant neoplasm of brain: Secondary | ICD-10-CM | POA: Diagnosis not present

## 2021-05-31 DIAGNOSIS — I471 Supraventricular tachycardia: Secondary | ICD-10-CM | POA: Diagnosis not present

## 2021-05-31 DIAGNOSIS — I480 Paroxysmal atrial fibrillation: Secondary | ICD-10-CM | POA: Diagnosis not present

## 2021-05-31 DIAGNOSIS — I420 Dilated cardiomyopathy: Secondary | ICD-10-CM | POA: Diagnosis not present

## 2021-06-01 ENCOUNTER — Telehealth: Payer: Medicare PPO

## 2021-06-01 ENCOUNTER — Telehealth: Payer: Self-pay

## 2021-06-01 NOTE — Telephone Encounter (Signed)
  Care Management   Follow Up Note   06/01/2021 Name: Valerie [name] MRN: [id number] DOB: 1945/10/08   Referred by: [name], MD Reason for referral : Chronic Care Management (Unsuccessful call)   A second unsuccessful telephone outreach was attempted today. The patient was referred to the case management team for assistance with care management and care coordination.   Follow Up Plan: The care management team will reach out to the patient again over the next 30 days.   [name], BSW, CDP Social Worker, Certified Dementia Practitioner [place] [facility] [phone]

## 2021-06-02 DIAGNOSIS — I480 Paroxysmal atrial fibrillation: Secondary | ICD-10-CM | POA: Diagnosis not present

## 2021-06-02 DIAGNOSIS — C3411 Malignant neoplasm of upper lobe, right bronchus or lung: Secondary | ICD-10-CM | POA: Diagnosis not present

## 2021-06-02 DIAGNOSIS — I420 Dilated cardiomyopathy: Secondary | ICD-10-CM | POA: Diagnosis not present

## 2021-06-02 DIAGNOSIS — D630 Anemia in neoplastic disease: Secondary | ICD-10-CM | POA: Diagnosis not present

## 2021-06-02 DIAGNOSIS — I5041 Acute combined systolic (congestive) and diastolic (congestive) heart failure: Secondary | ICD-10-CM | POA: Diagnosis not present

## 2021-06-02 DIAGNOSIS — I2510 Atherosclerotic heart disease of native coronary artery without angina pectoris: Secondary | ICD-10-CM | POA: Diagnosis not present

## 2021-06-02 DIAGNOSIS — C7931 Secondary malignant neoplasm of brain: Secondary | ICD-10-CM | POA: Diagnosis not present

## 2021-06-02 DIAGNOSIS — I471 Supraventricular tachycardia: Secondary | ICD-10-CM | POA: Diagnosis not present

## 2021-06-02 DIAGNOSIS — I110 Hypertensive heart disease with heart failure: Secondary | ICD-10-CM | POA: Diagnosis not present

## 2021-06-05 IMAGING — CT CT HEAD W/O CM
4 of 9 series · 15 of 47 positions shown, 17 images · non-contrast
Comparison: September 13, 2018

CLINICAL DATA: Fall, on blood thinner

EXAM:
CT HEAD WITHOUT CONTRAST
TECHNIQUE: Contiguous axial images were obtained from the base of the skull
through the vertex without intravenous contrast.

[Series 3: head wo · axial · 0.47mm/px · z∈[-37,+63]mm · 5 of 32 slices shown, 7 images]
[im 6/32  brain]
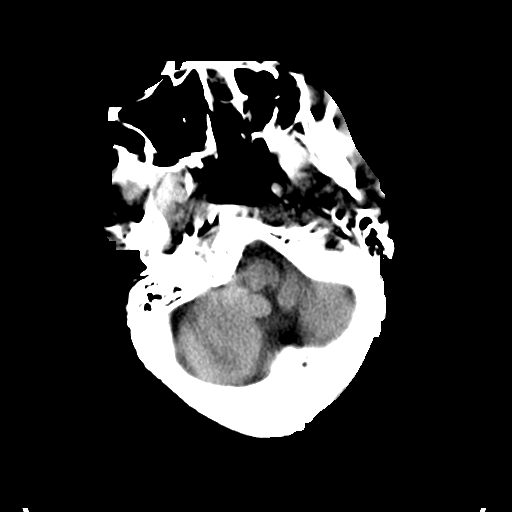
[im 6/32  bone]
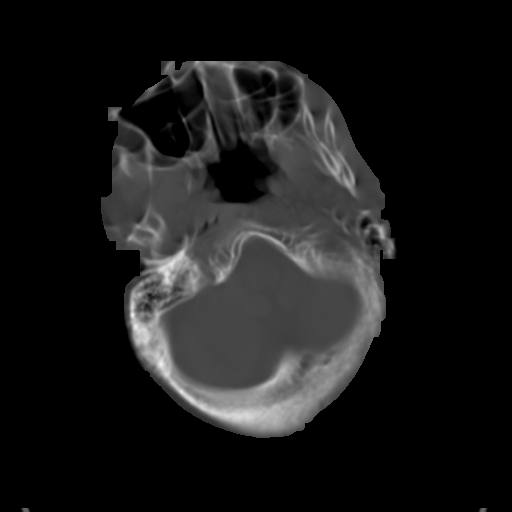
[im 11/32  brain]
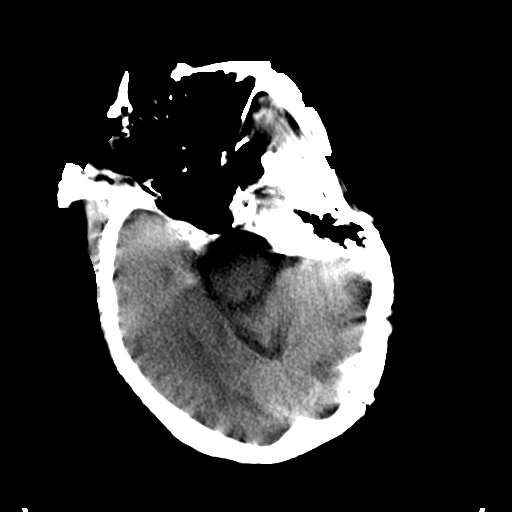
[im 16/32  brain]
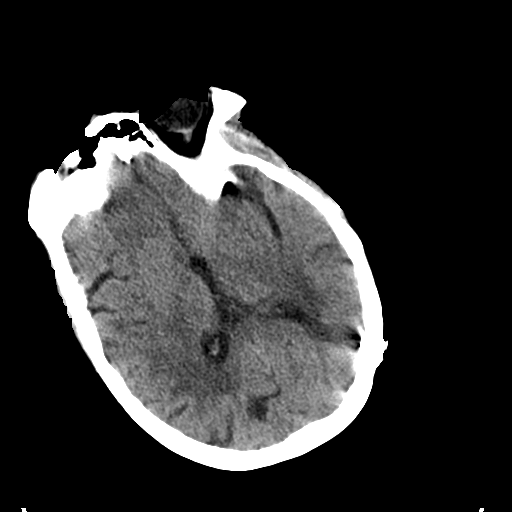
[im 21/32  brain]
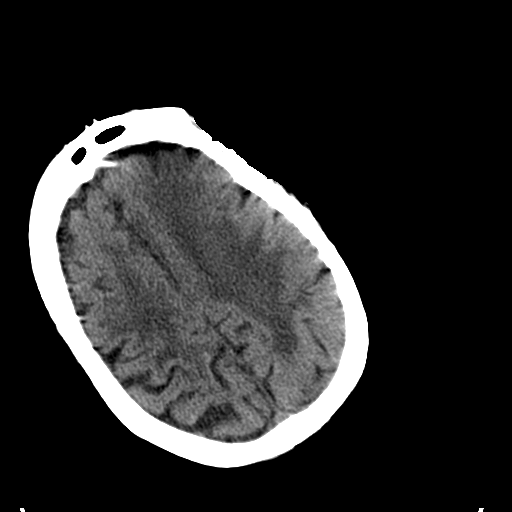
[im 26/32  brain]
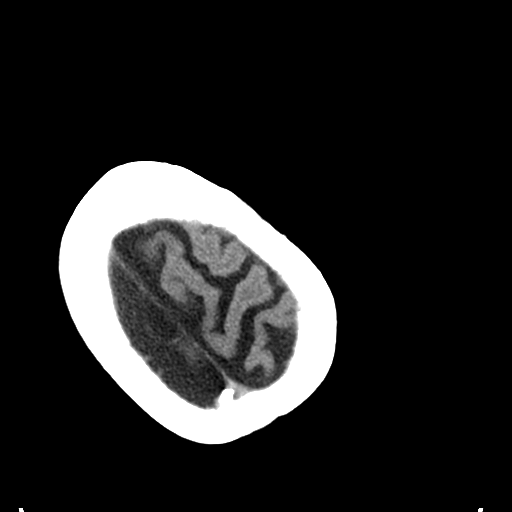
[im 26/32  bone]
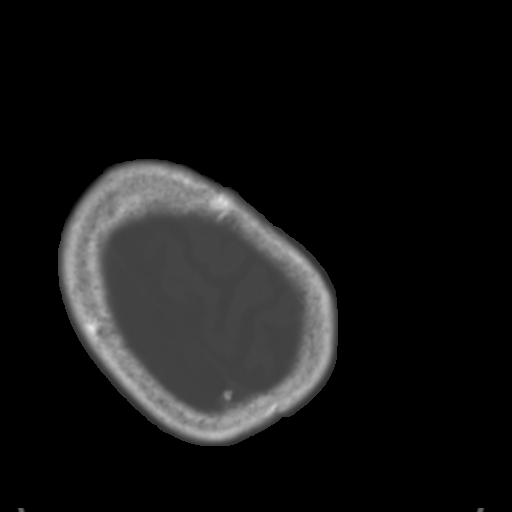

[Series 9: coronal soft tissue · coronal · 0.18mm/px · 2 of 71 slices shown]
[im 17/71  brain]
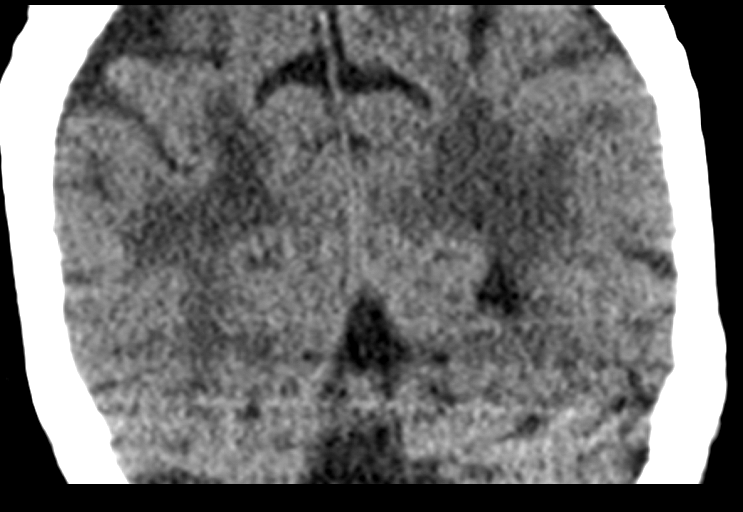
[im 33/71  brain]
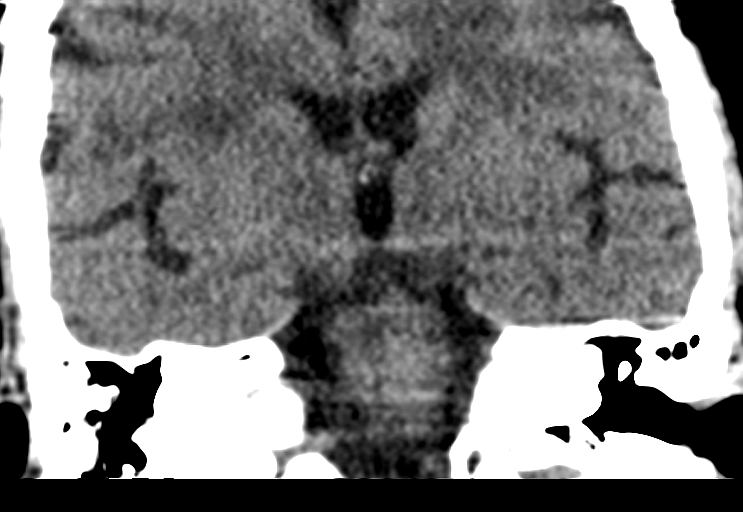

[Series 12: c spine soft · axial · 0.39mm/px · z∈[-192,-58]mm · 7 of 99 slices shown]
[im 11/99  brain]
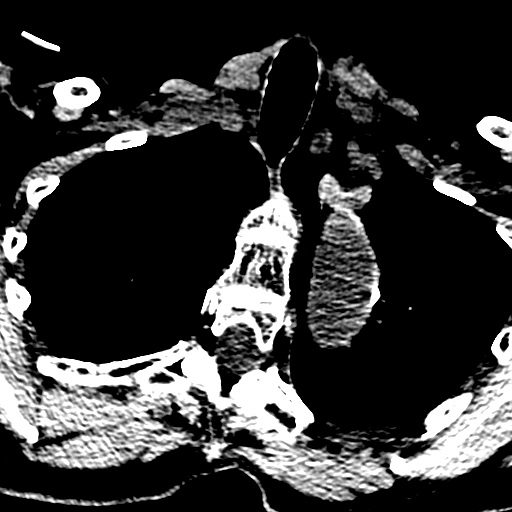
[im 21/99  brain]
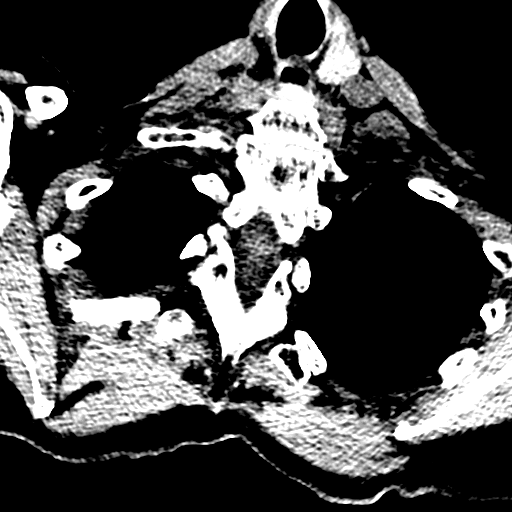
[im 31/99  brain]
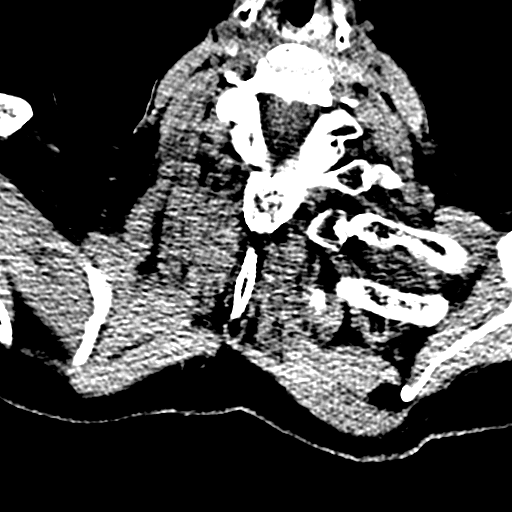
[im 42/99  brain]
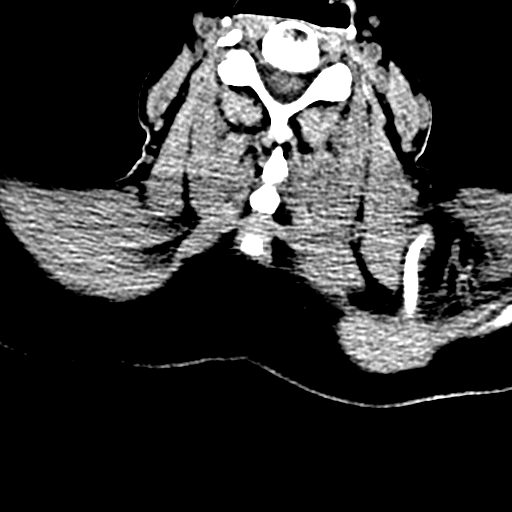
[im 57/99  brain]
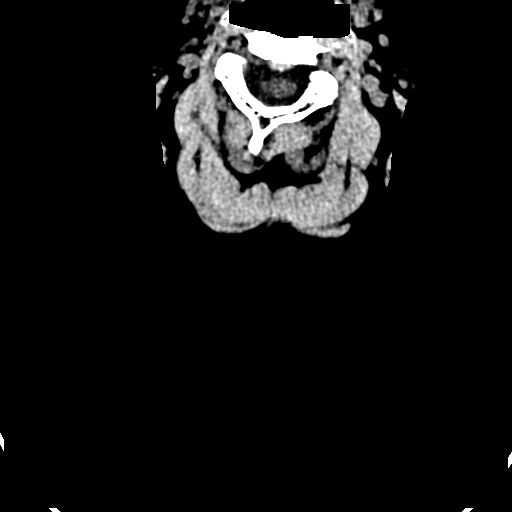
[im 68/99  brain]
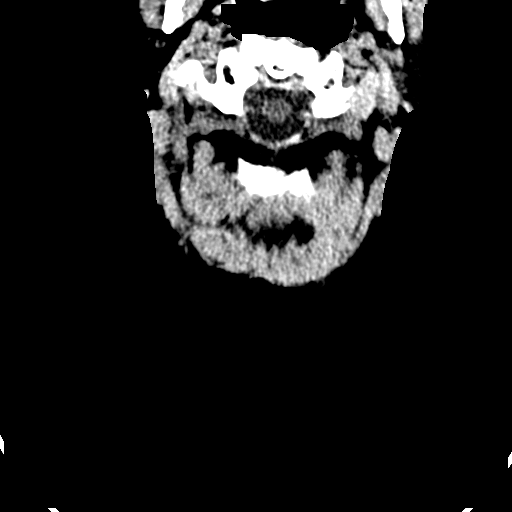
[im 78/99  brain]
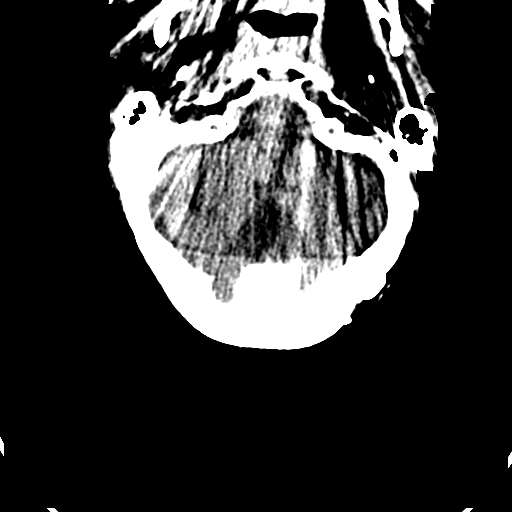

[Series 15: sagittal bone · sagittal · 0.25mm/px · 1 of 61 slices shown]
[im 31/61  brain]
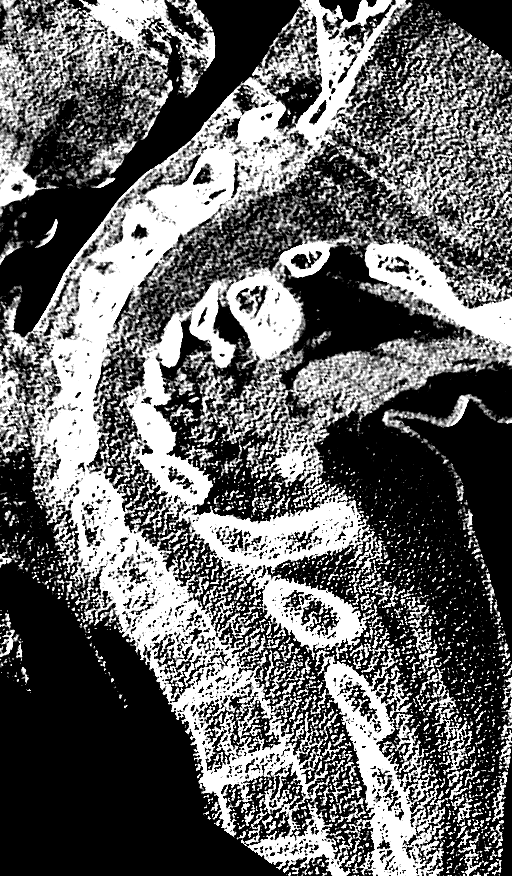

[15 of 47 positions shown; findings below may reference images not displayed]

FINDINGS: The study is limited due to patient motion.

Brain: No evidence of acute territorial infarction, hemorrhage,
hydrocephalus,extra-axial collection or mass lesion/mass effect.
There is dilatation the ventricles and sulci consistent with
age-related atrophy. Low-attenuation changes in the deep white
matter consistent with small vessel ischemia.

Vascular: No hyperdense vessel or unexpected calcification.

Skull: Postsurgical changes from a prior left occipital craniotomy
are again noted.

Sinuses/Orbits: The visualized paranasal sinuses and mastoid air
cells are clear. The orbits and globes intact.

Other: None

Cervical spine:

Alignment: Physiologic

Skull base and vertebrae: Visualized skull base is intact. No
atlanto-occipital dissociation. The vertebral body heights are well
maintained. No fracture or pathologic osseous lesion seen.

Soft tissues and spinal canal: The visualized paraspinal soft
tissues are unremarkable. No prevertebral soft tissue swelling is
seen. The spinal canal is grossly unremarkable, no large epidural
collection or significant canal narrowing.

Disc levels: Mild disc height loss with uncovertebral osteophytes
and disc osteophyte complex is seen most notable at C4-C5.

Upper chest: The lung apices are clear. Thoracic inlet is within
normal limits.

Other: None
IMPRESSION: No acute intracranial abnormality, somewhat limited due to patient
motion.

Findings consistent with age related atrophy and chronic small
vessel ischemia

No acute fracture or malalignment of the spine.

## 2021-06-05 IMAGING — CR DG HIP (WITH OR WITHOUT PELVIS) 2-3V*R*
3 series · 3 of 3 positions shown · non-contrast
Comparison: None.

CLINICAL DATA: Hip pain after fall

EXAM:
DG HIP (WITH OR WITHOUT PELVIS) 2-3V RIGHT

[x pelvis (1 of 2)]
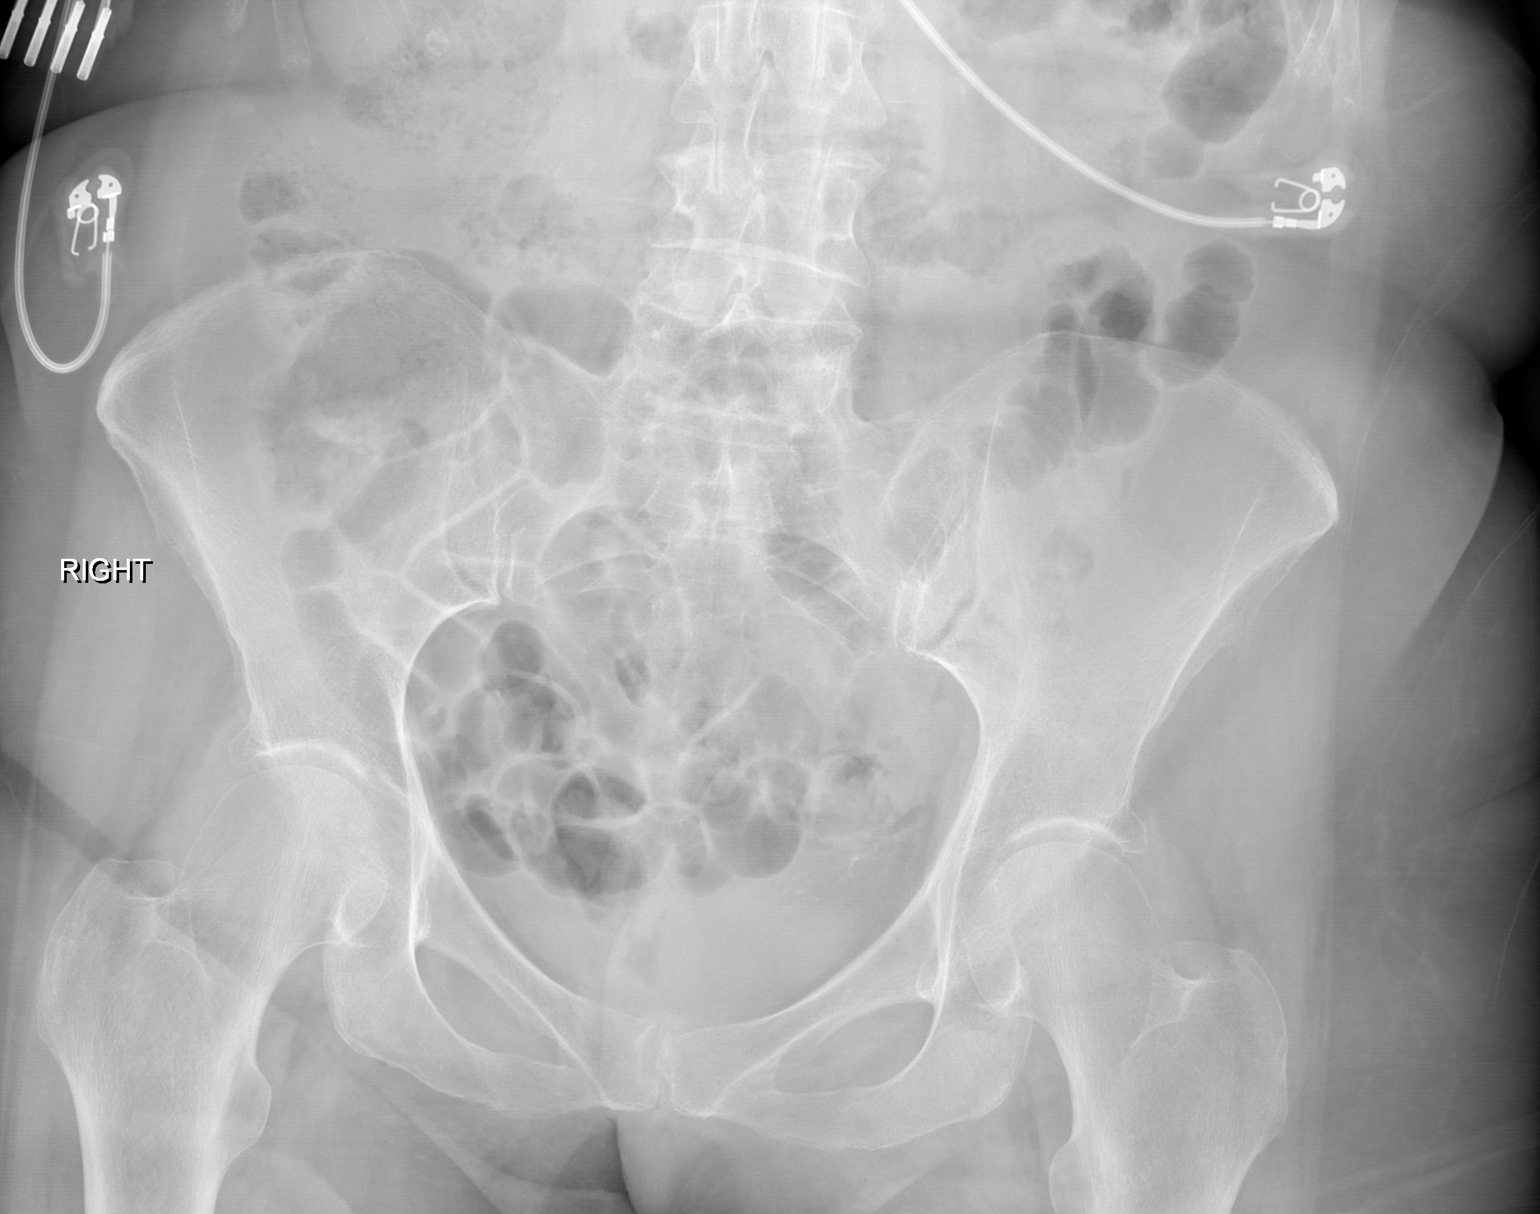

[x pelvis (2 of 2)]
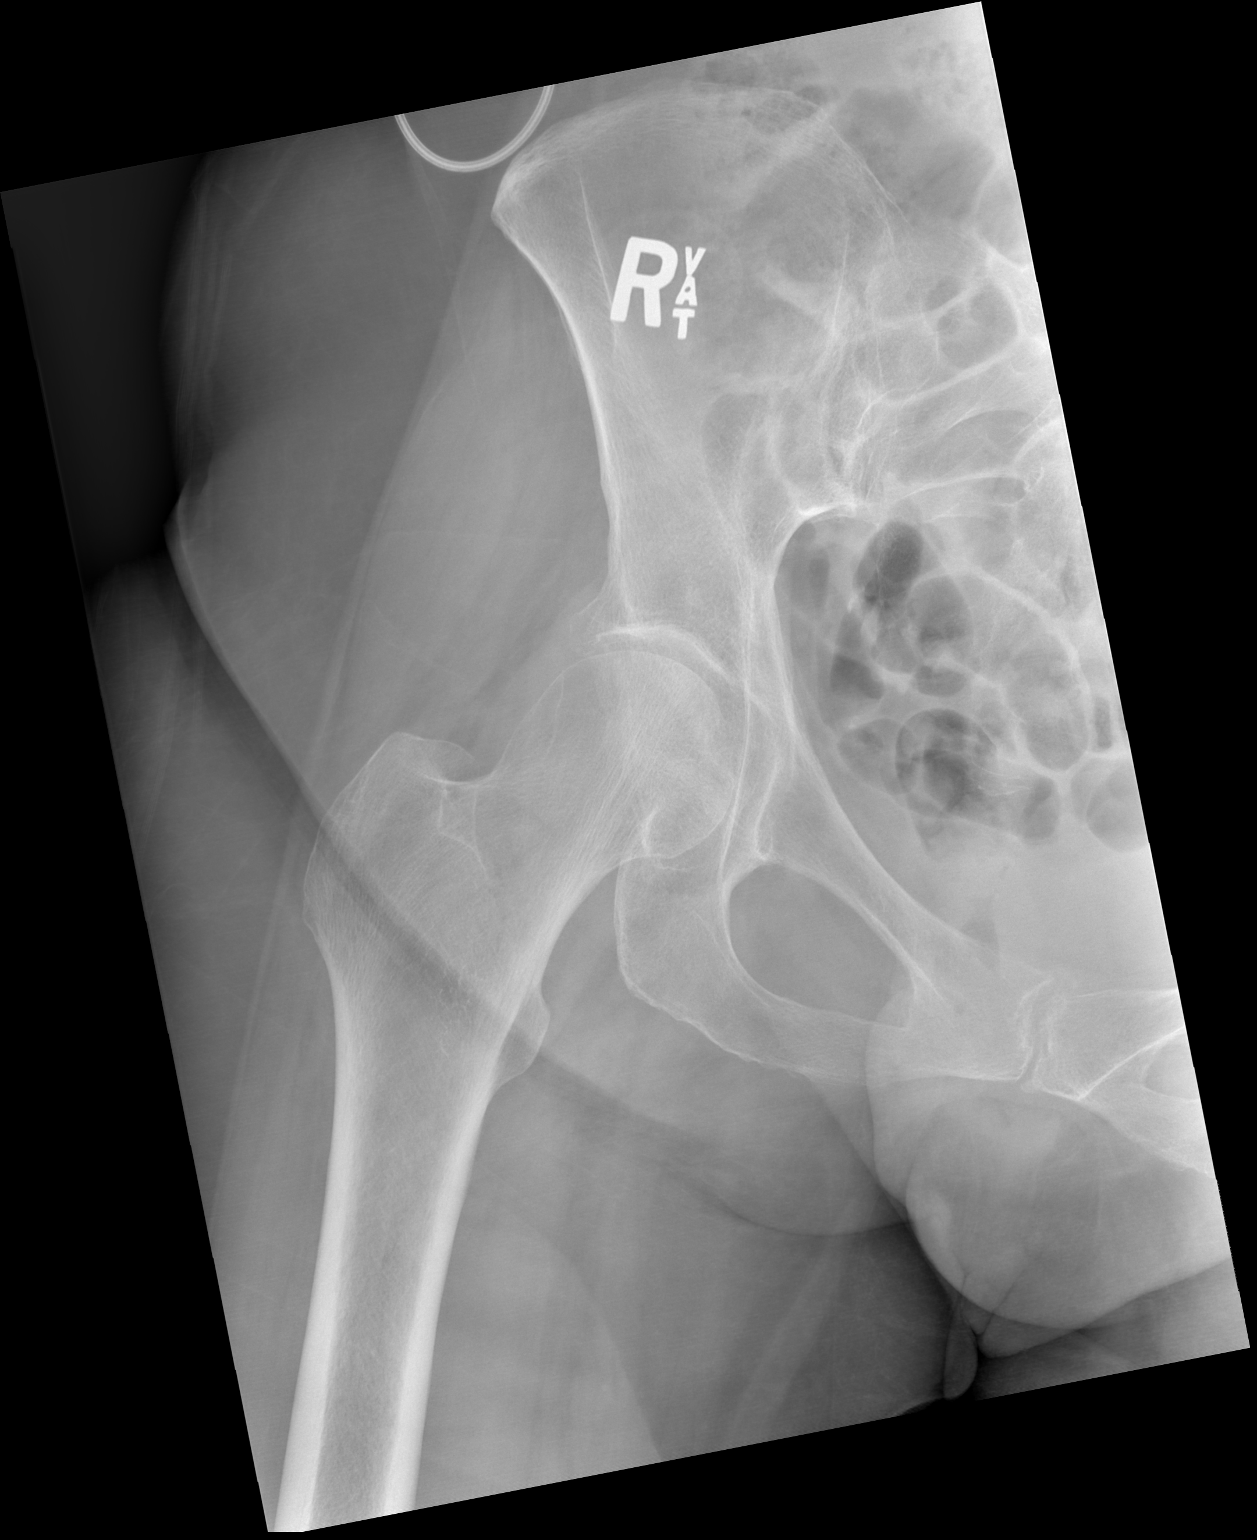

[w hip lat right]
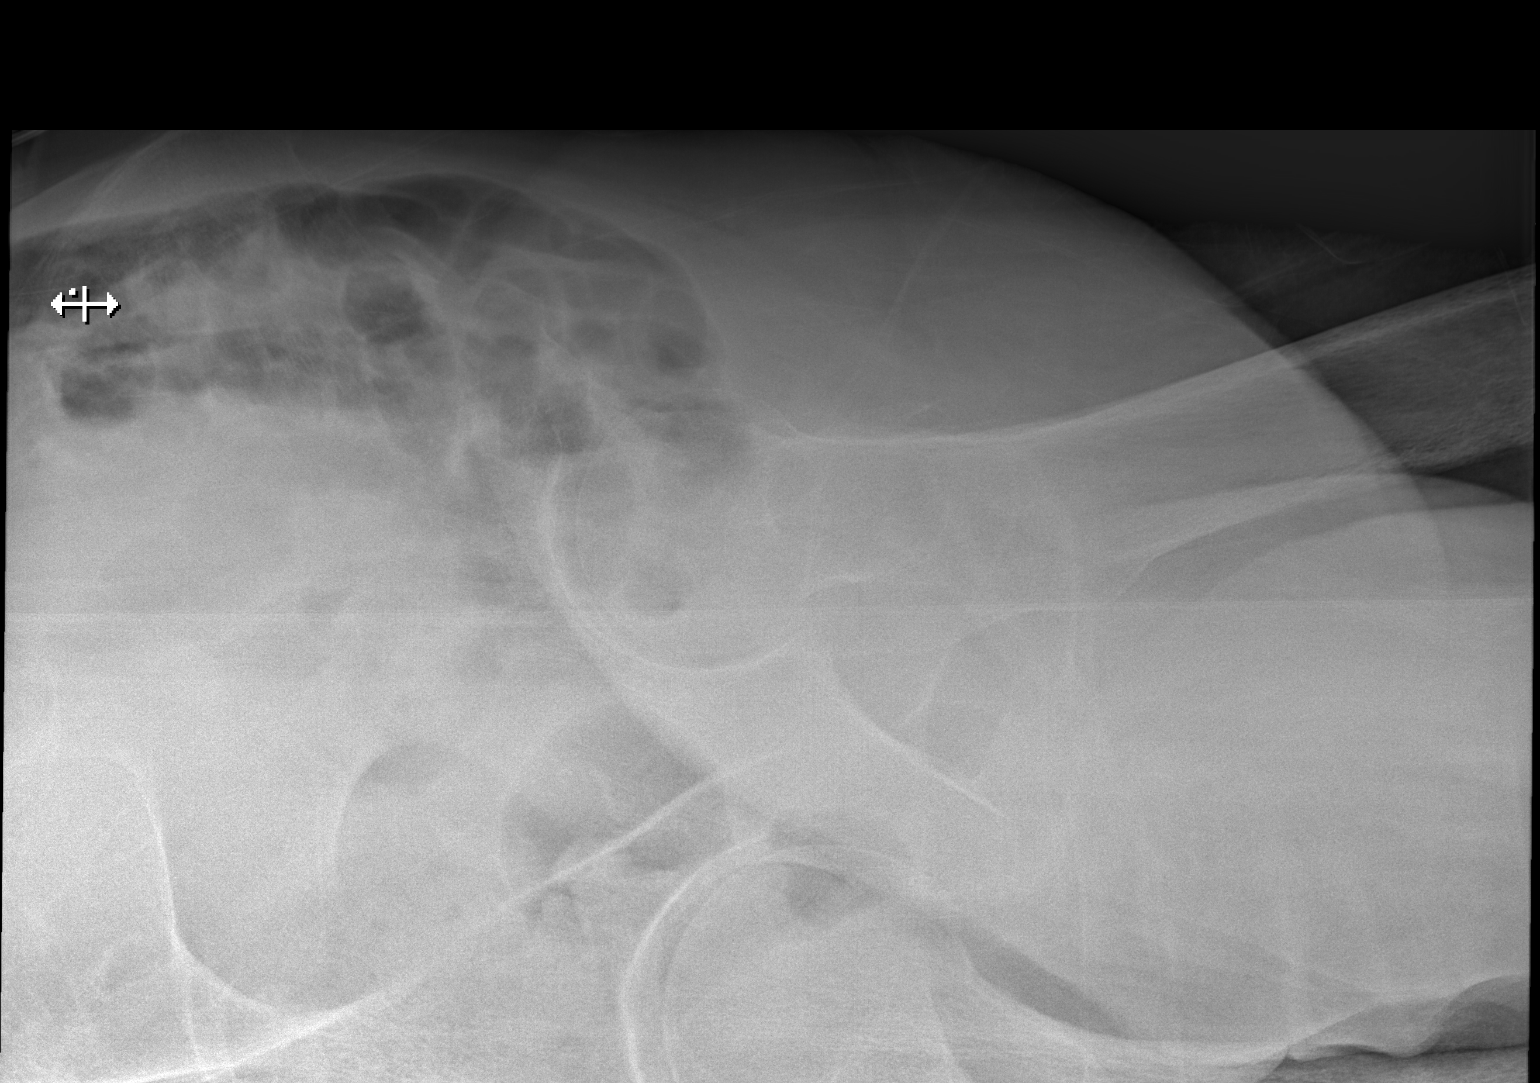

[3 of 3 positions shown; findings below may reference images not displayed]

FINDINGS: There is no evidence of hip fracture or dislocation. Moderate hip
osteoarthritis is seen with superior joint space loss and osteophyte
formation.
IMPRESSION: No acute osseous abnormality.

## 2021-06-05 IMAGING — CR DG KNEE COMPLETE 4+V*R*
4 series · 4 of 4 positions shown · non-contrast
Comparison: None.

CLINICAL DATA: Knee pain

EXAM:
RIGHT KNEE - COMPLETE 4+ VIEW

[x knee ap right (1 of 3)]
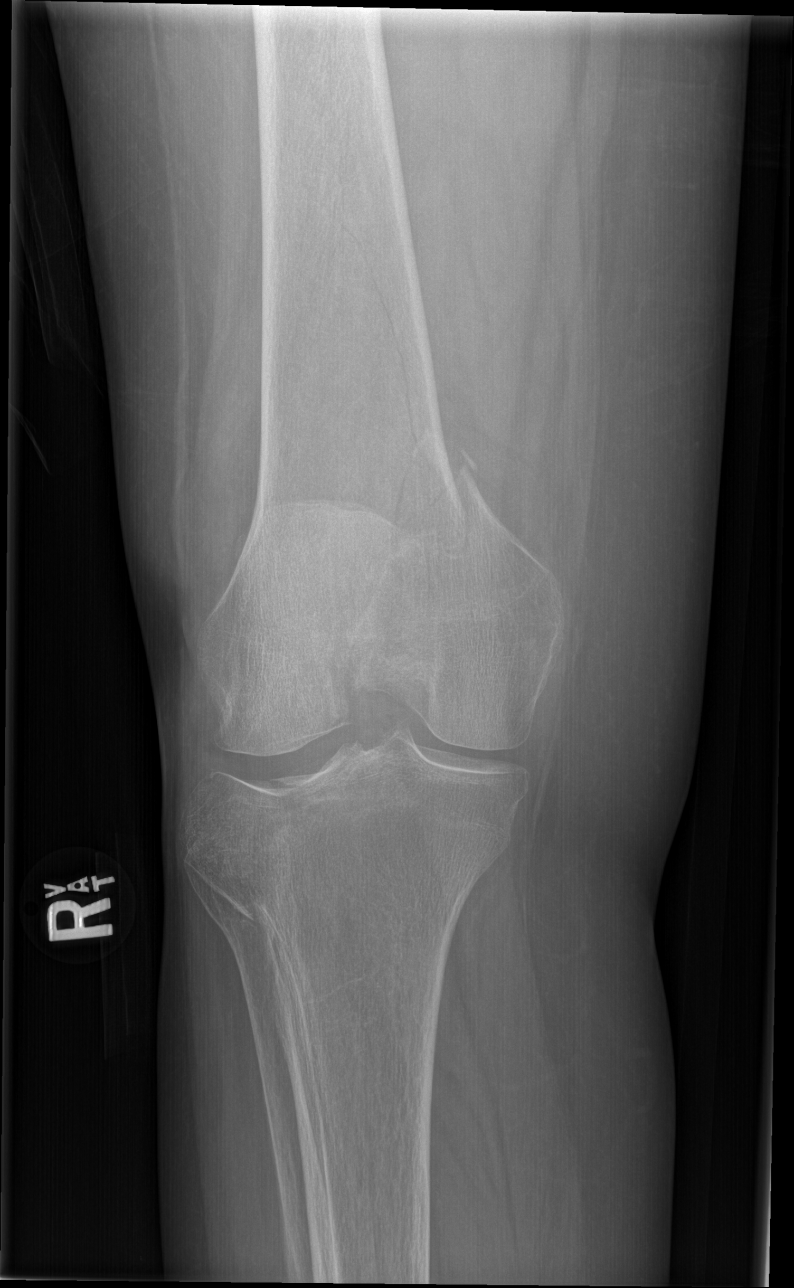

[x knee ap right (2 of 3)]
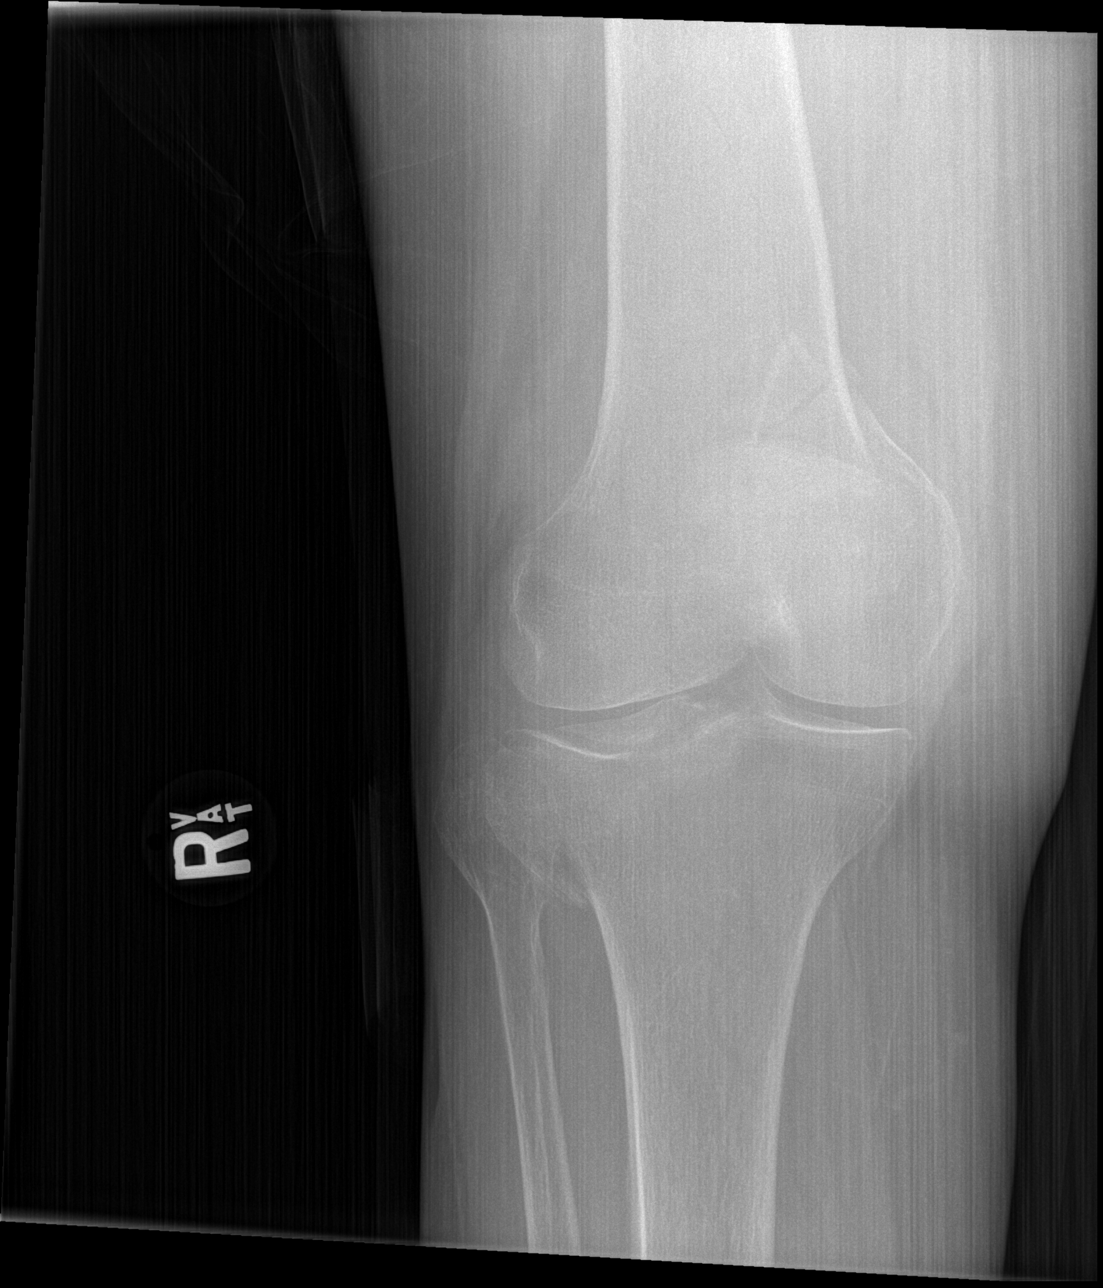

[x knee ap right (3 of 3)]
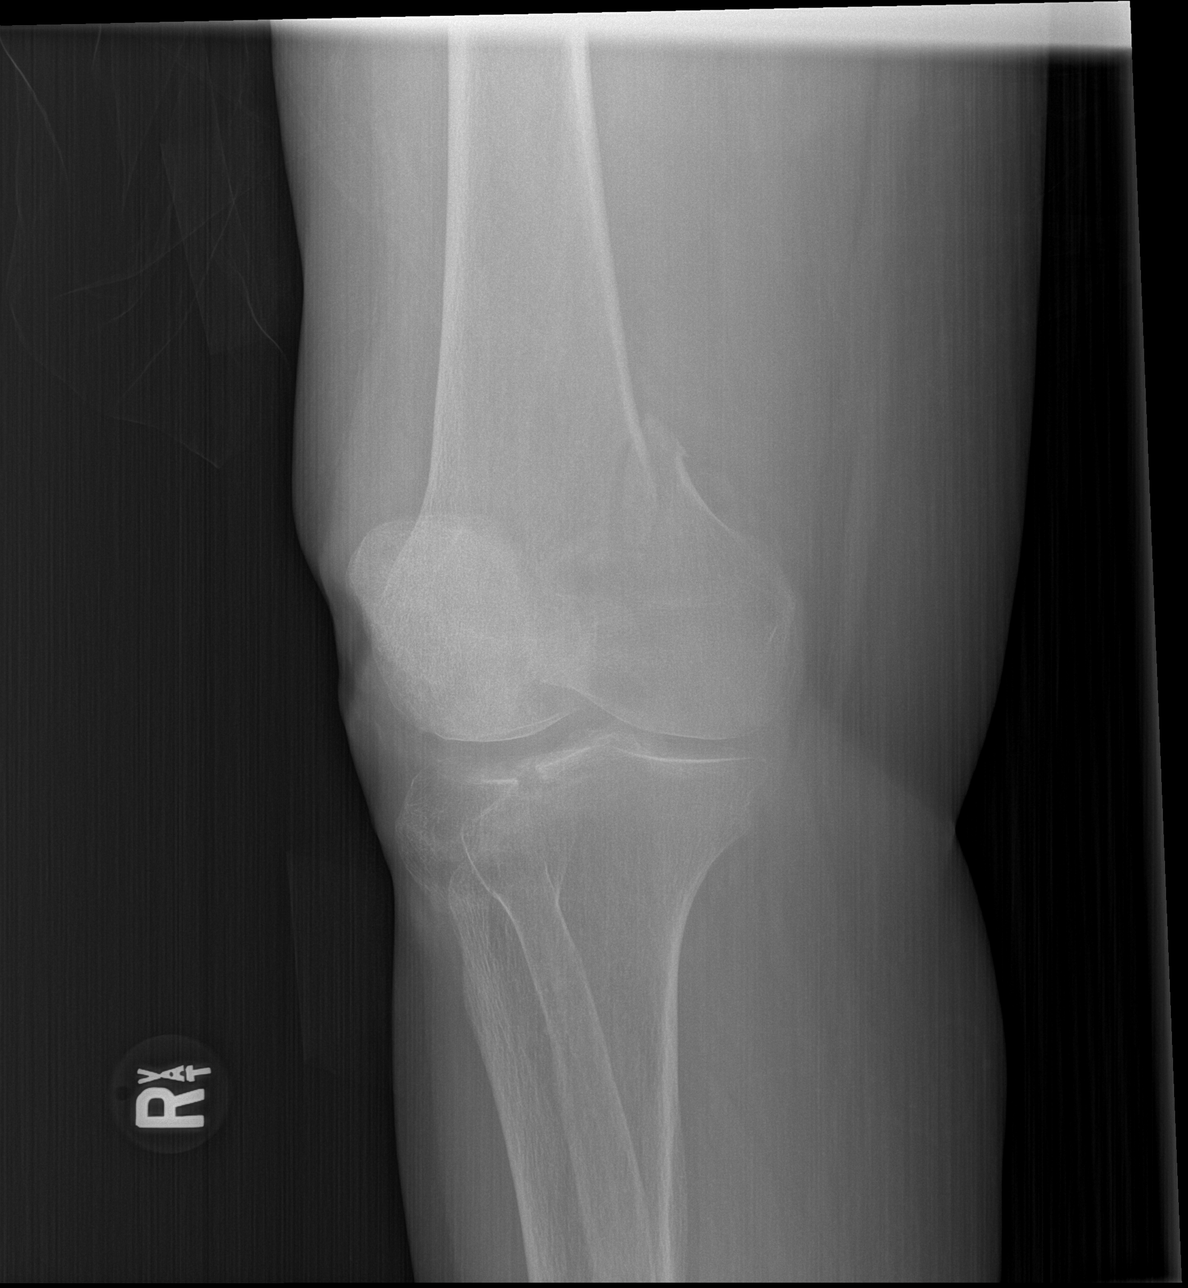

[x knee lat right]
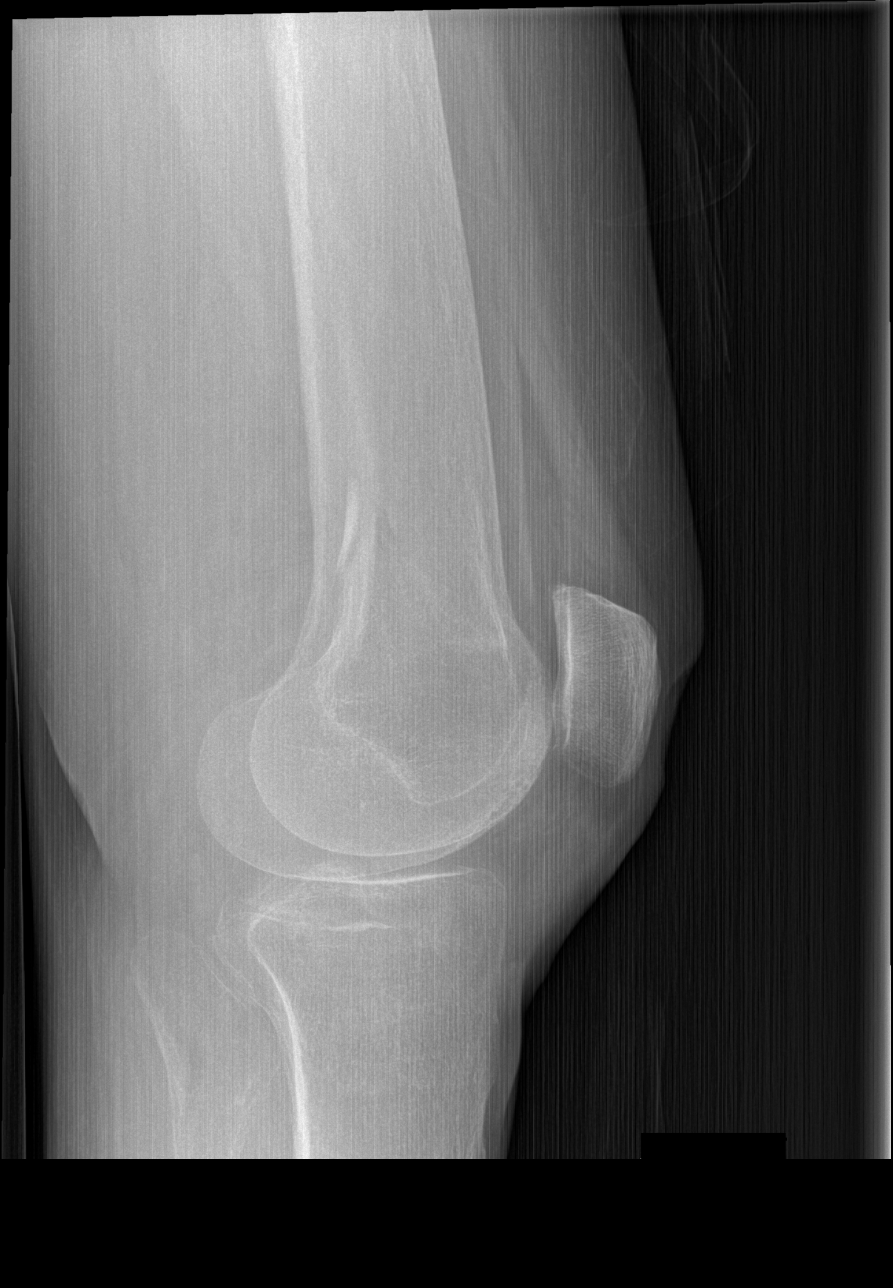

[4 of 4 positions shown; findings below may reference images not displayed]

FINDINGS: There is comminuted slightly impacted fracture seen through the
intracondylar notch extending through the medial femoral condyle and
medial metadiaphysis. There is also intra-articular mildly
comminuted impacted fracture seen through the lateral tibial
plateau. A small lipohemarthrosis is seen. Prepatellar subcutaneous
edema is noted.
IMPRESSION: 1. Comminuted impacted intra-articular fracture through the
intracondylar notch extending through the medial femoral condyle and
femoral shaft.
2. Comminuted mildly impacted intra-articular fracture of the
lateral tibial plateau.
3. Small lipohemarthrosis

## 2021-06-05 IMAGING — CT CT CERVICAL SPINE W/O CM
3 of 9 series · 11 of 33 positions shown, 13 images · non-contrast
Comparison: September 13, 2018

CLINICAL DATA: Fall, on blood thinner

EXAM:
CT HEAD WITHOUT CONTRAST
TECHNIQUE: Contiguous axial images were obtained from the base of the skull
through the vertex without intravenous contrast.

[Series 9: coronal soft tissue · coronal · 0.18mm/px · 1 of 71 slices shown]
[im 36/71  bone]
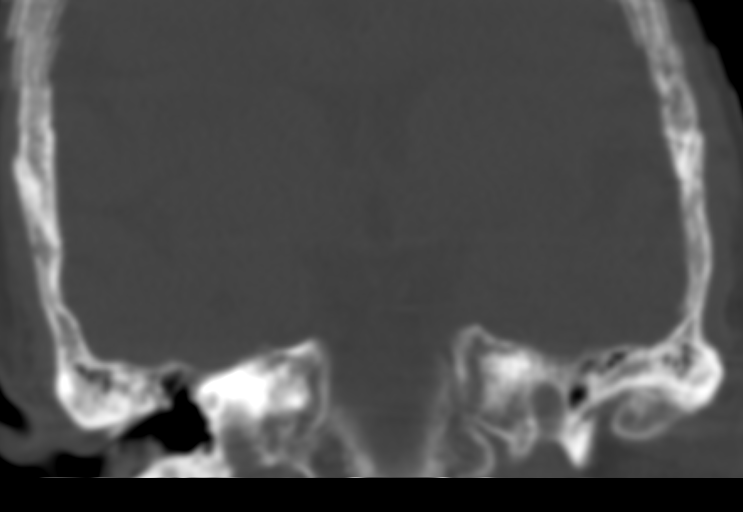

[Series 12: c spine soft · axial · 0.39mm/px · z∈[-188,-42]mm · 7 of 99 slices shown, 9 images]
[im 13/99  soft-tissue]
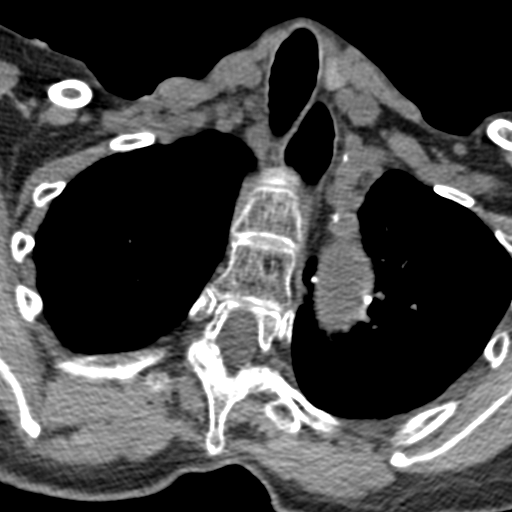
[im 13/99  bone]
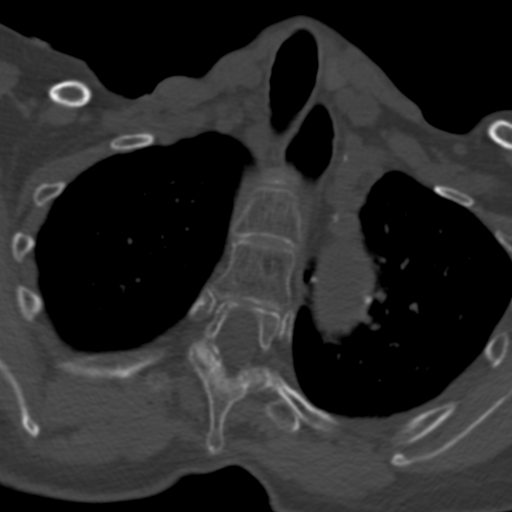
[im 25/99  bone]
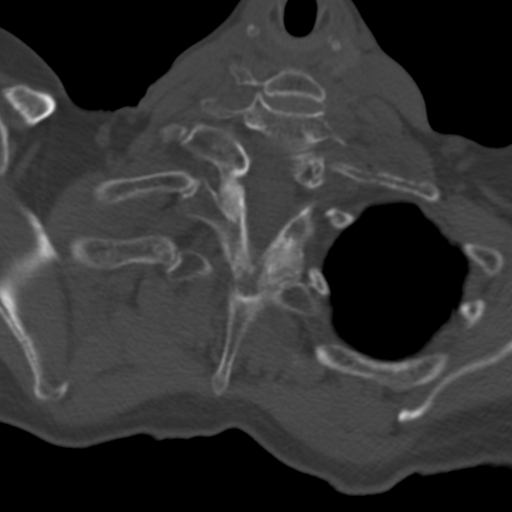
[im 37/99  bone]
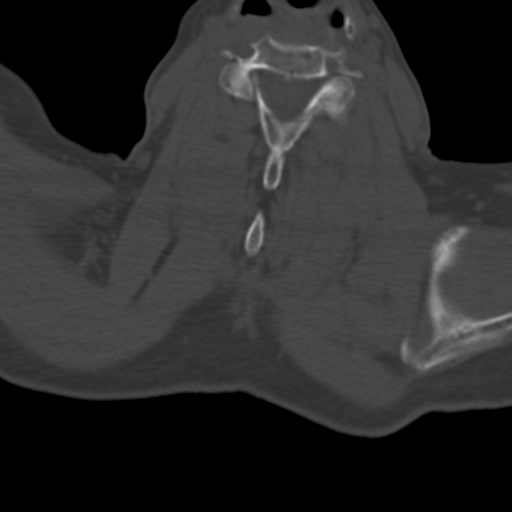
[im 50/99  bone]
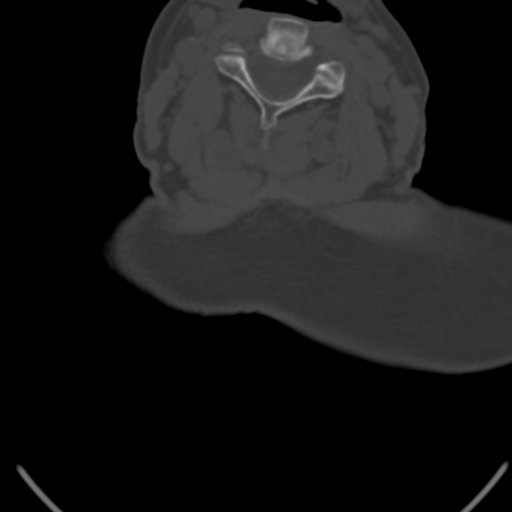
[im 62/99  soft-tissue]
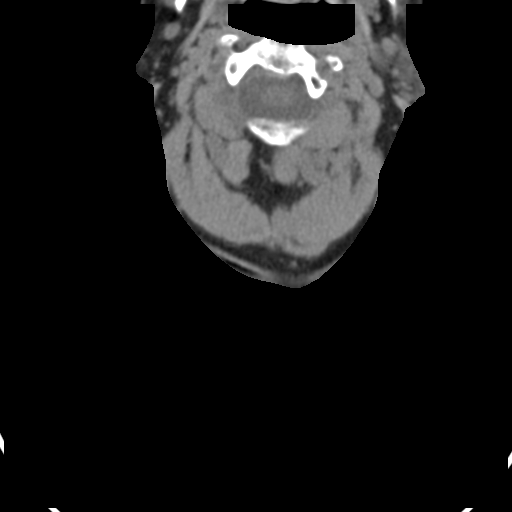
[im 62/99  bone]
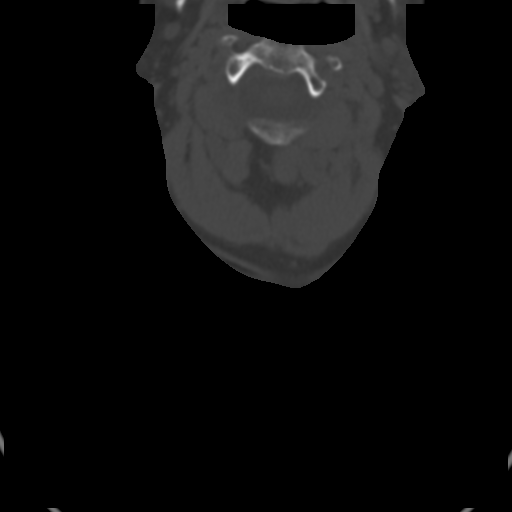
[im 74/99  bone]
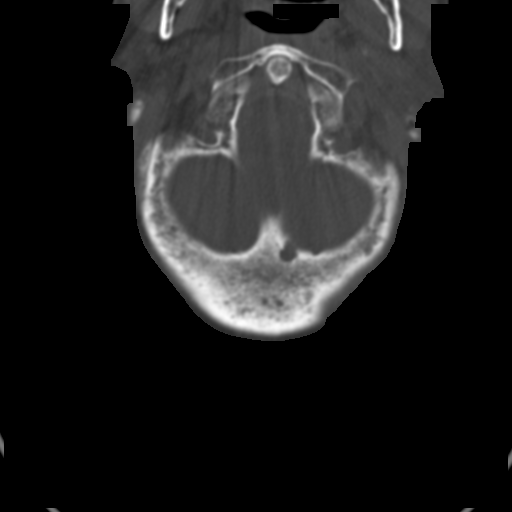
[im 86/99  bone]
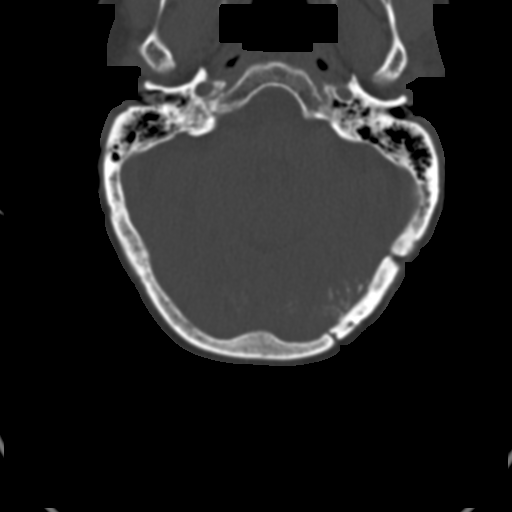

[Series 15: sagittal bone · sagittal · 0.25mm/px · 3 of 61 slices shown]
[im 16/61  bone]
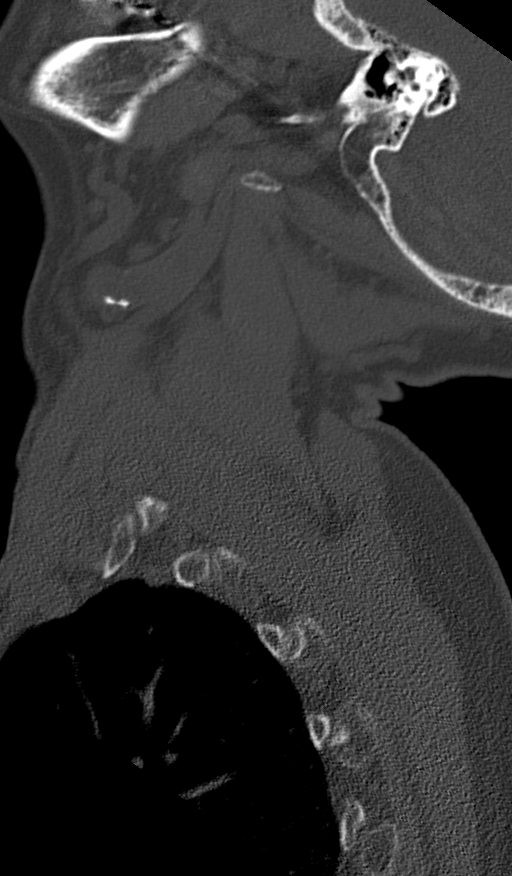
[im 31/61  bone]
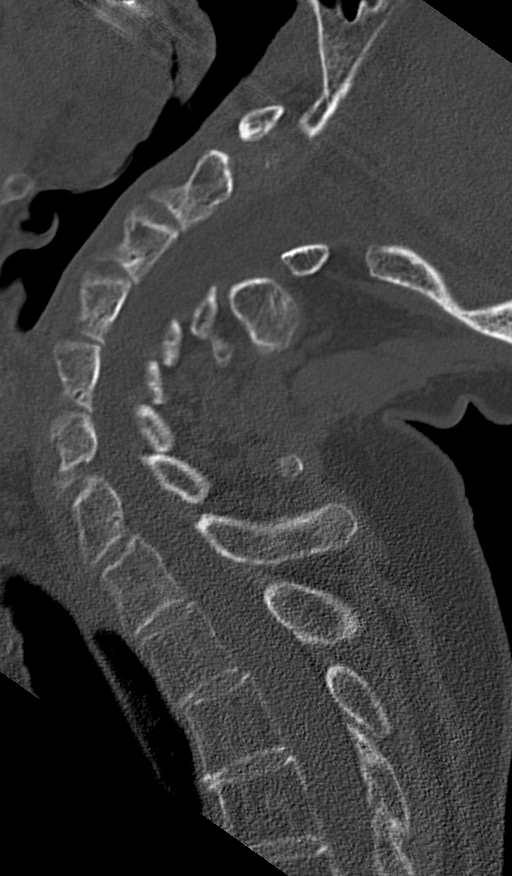
[im 46/61  bone]
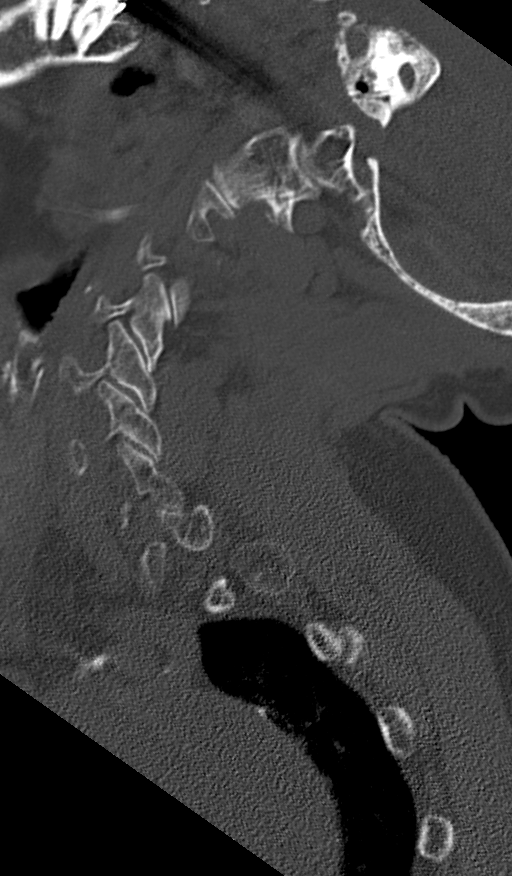

[11 of 33 positions shown; findings below may reference images not displayed]

FINDINGS: The study is limited due to patient motion.

Brain: No evidence of acute territorial infarction, hemorrhage,
hydrocephalus,extra-axial collection or mass lesion/mass effect.
There is dilatation the ventricles and sulci consistent with
age-related atrophy. Low-attenuation changes in the deep white
matter consistent with small vessel ischemia.

Vascular: No hyperdense vessel or unexpected calcification.

Skull: Postsurgical changes from a prior left occipital craniotomy
are again noted.

Sinuses/Orbits: The visualized paranasal sinuses and mastoid air
cells are clear. The orbits and globes intact.

Other: None

Cervical spine:

Alignment: Physiologic

Skull base and vertebrae: Visualized skull base is intact. No
atlanto-occipital dissociation. The vertebral body heights are well
maintained. No fracture or pathologic osseous lesion seen.

Soft tissues and spinal canal: The visualized paraspinal soft
tissues are unremarkable. No prevertebral soft tissue swelling is
seen. The spinal canal is grossly unremarkable, no large epidural
collection or significant canal narrowing.

Disc levels: Mild disc height loss with uncovertebral osteophytes
and disc osteophyte complex is seen most notable at C4-C5.

Upper chest: The lung apices are clear. Thoracic inlet is within
normal limits.

Other: None
IMPRESSION: No acute intracranial abnormality, somewhat limited due to patient
motion.

Findings consistent with age related atrophy and chronic small
vessel ischemia

No acute fracture or malalignment of the spine.

## 2021-06-05 IMAGING — CR DG TIBIA/FIBULA 2V*R*
4 series · 4 of 4 positions shown · non-contrast
Comparison: None.

CLINICAL DATA: Pain after fall

EXAM:
RIGHT TIBIA AND FIBULA - 2 VIEW

[x tib-fib ap right (1 of 2)]
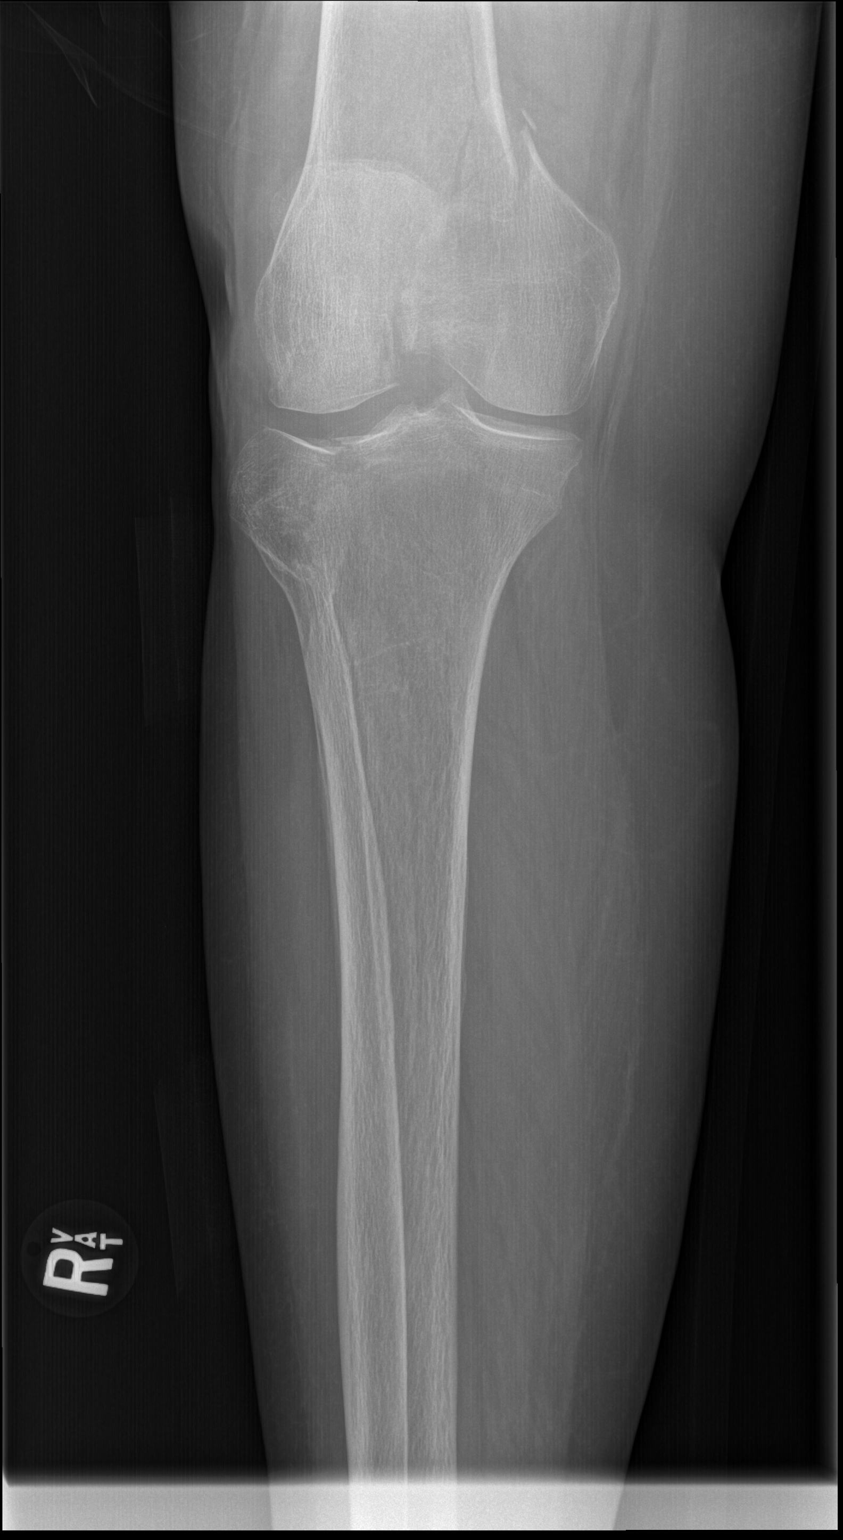

[x tib-fib ap right (2 of 2)]
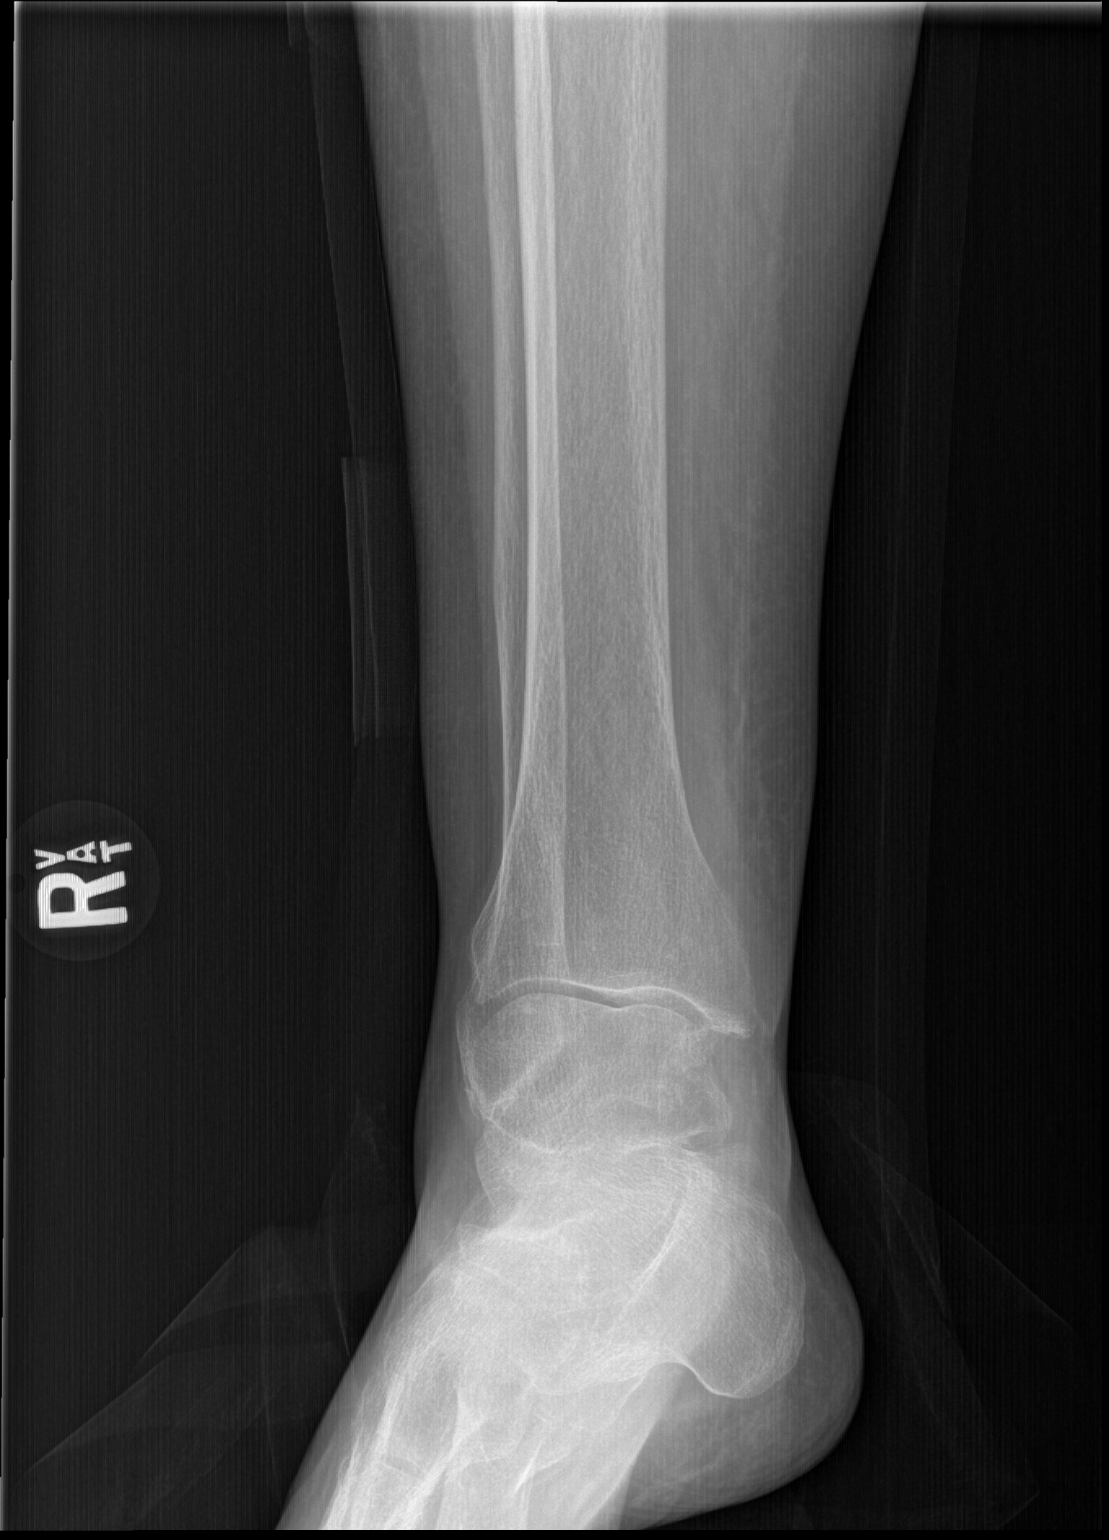

[x tib-fib lat right (1 of 2)]
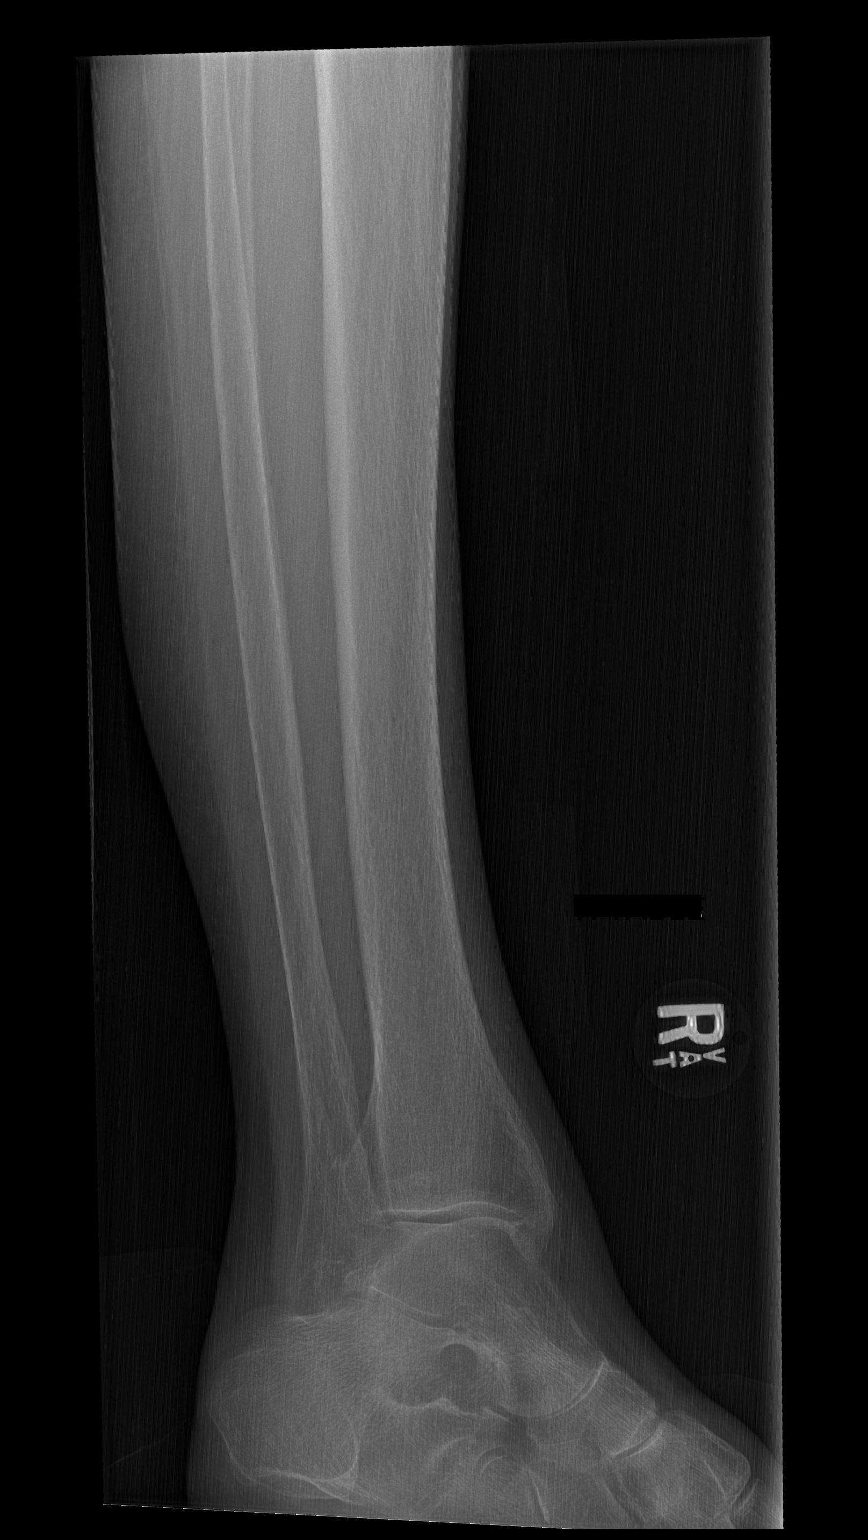

[x tib-fib lat right (2 of 2)]
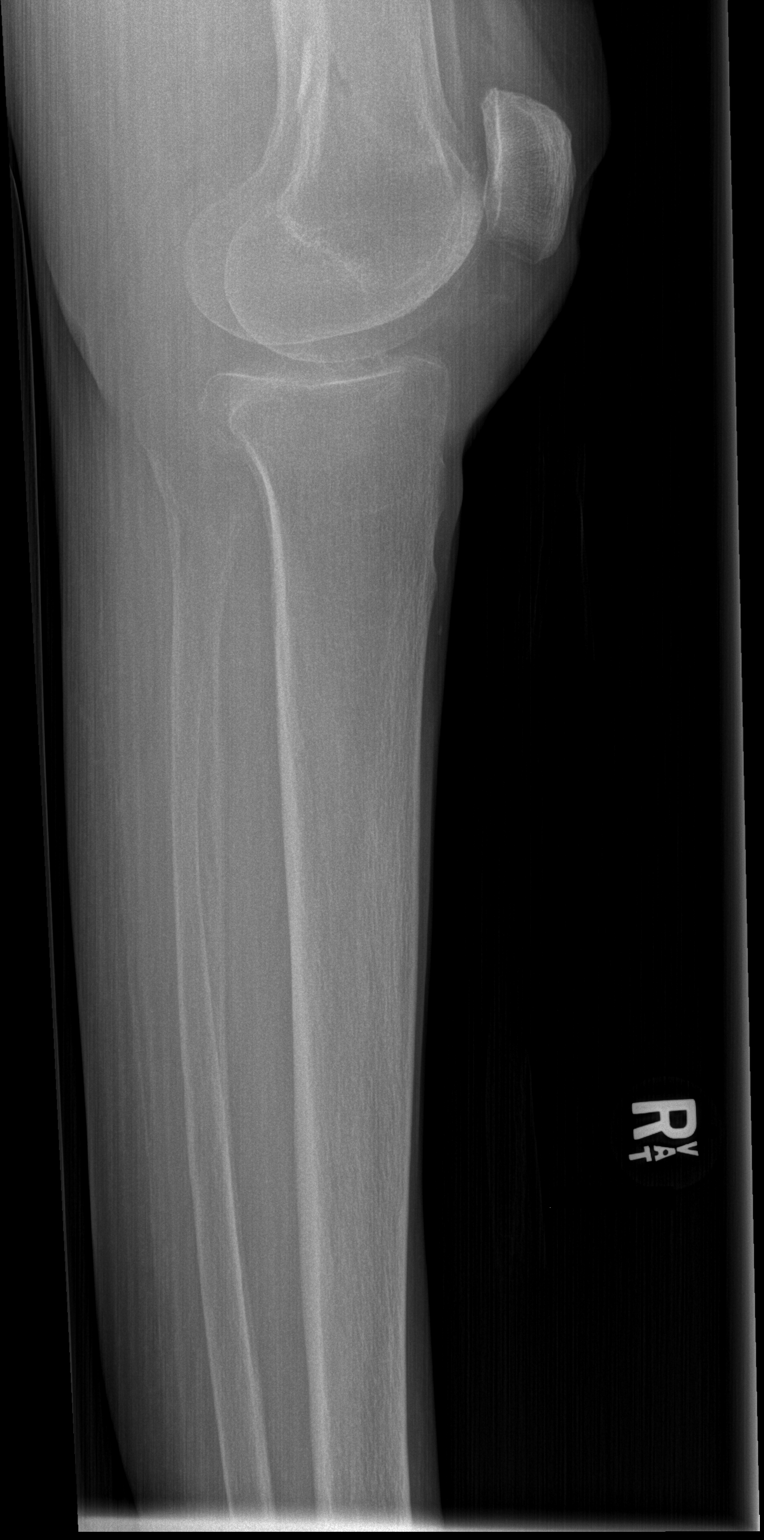

[4 of 4 positions shown; findings below may reference images not displayed]

FINDINGS: Comminuted impacted intra-articular fracture of the intracondylar
notch and medial femoral condyle. There is also comminuted mildly
impacted intra-articular fracture of the lateral tibial plateau.
There is diffuse osteopenia. Prepatellar subcutaneous edema.
IMPRESSION: Comminuted fractures of the distal femur and lateral tibial plateau.
Prepatellar subcutaneous edema.

Diffuse osteopenia.

## 2021-06-06 DIAGNOSIS — I471 Supraventricular tachycardia: Secondary | ICD-10-CM | POA: Diagnosis not present

## 2021-06-06 DIAGNOSIS — D630 Anemia in neoplastic disease: Secondary | ICD-10-CM | POA: Diagnosis not present

## 2021-06-06 DIAGNOSIS — C7931 Secondary malignant neoplasm of brain: Secondary | ICD-10-CM | POA: Diagnosis not present

## 2021-06-06 DIAGNOSIS — I5041 Acute combined systolic (congestive) and diastolic (congestive) heart failure: Secondary | ICD-10-CM | POA: Diagnosis not present

## 2021-06-06 DIAGNOSIS — I2510 Atherosclerotic heart disease of native coronary artery without angina pectoris: Secondary | ICD-10-CM | POA: Diagnosis not present

## 2021-06-06 DIAGNOSIS — I480 Paroxysmal atrial fibrillation: Secondary | ICD-10-CM | POA: Diagnosis not present

## 2021-06-06 DIAGNOSIS — I420 Dilated cardiomyopathy: Secondary | ICD-10-CM | POA: Diagnosis not present

## 2021-06-06 DIAGNOSIS — C3411 Malignant neoplasm of upper lobe, right bronchus or lung: Secondary | ICD-10-CM | POA: Diagnosis not present

## 2021-06-06 DIAGNOSIS — I110 Hypertensive heart disease with heart failure: Secondary | ICD-10-CM | POA: Diagnosis not present

## 2021-06-06 IMAGING — DX DG FEMUR 2+V PORT*R*
4 series · 4 of 4 positions shown · non-contrast
Comparison: None.

CLINICAL DATA: Status post ORIF of right femoral fracture

EXAM:
RIGHT FEMUR PORTABLE 2 VIEW

[femur ap (1 of 2)]
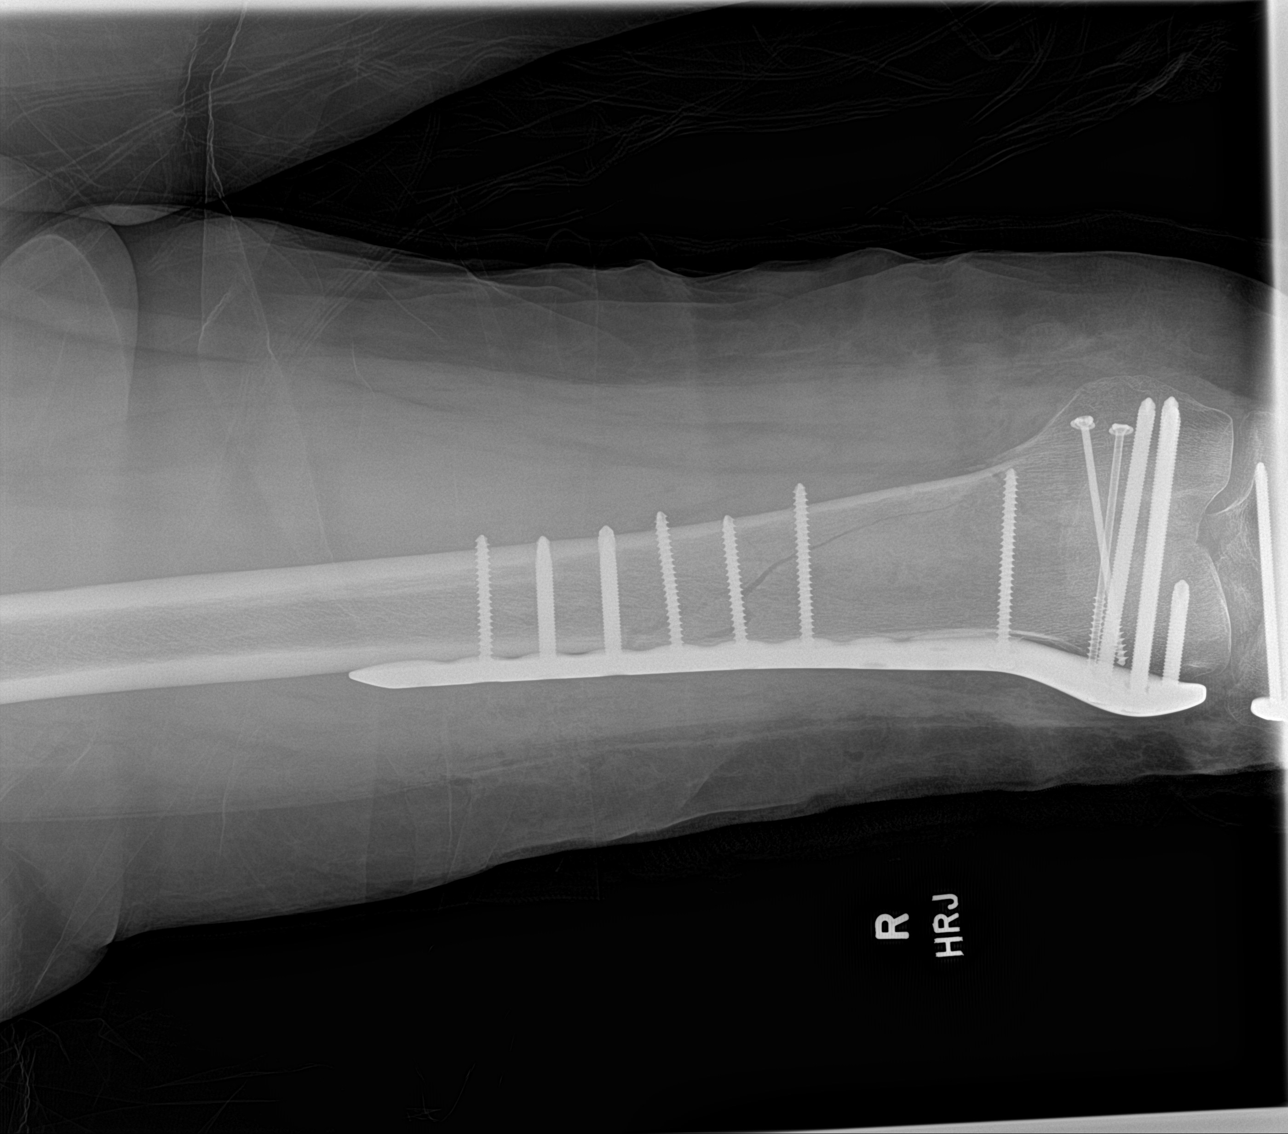

[femur ap (2 of 2)]
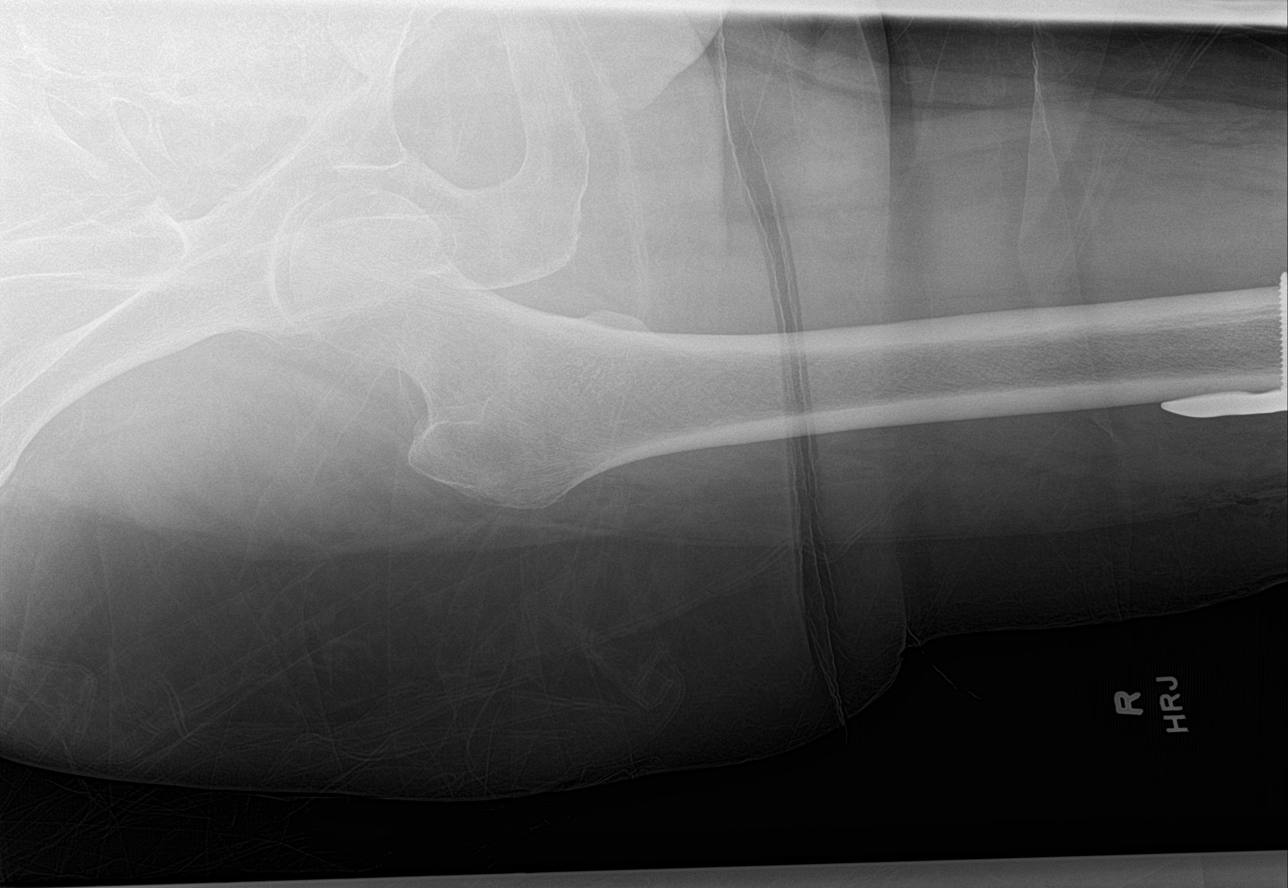

[femur lat (1 of 2)]
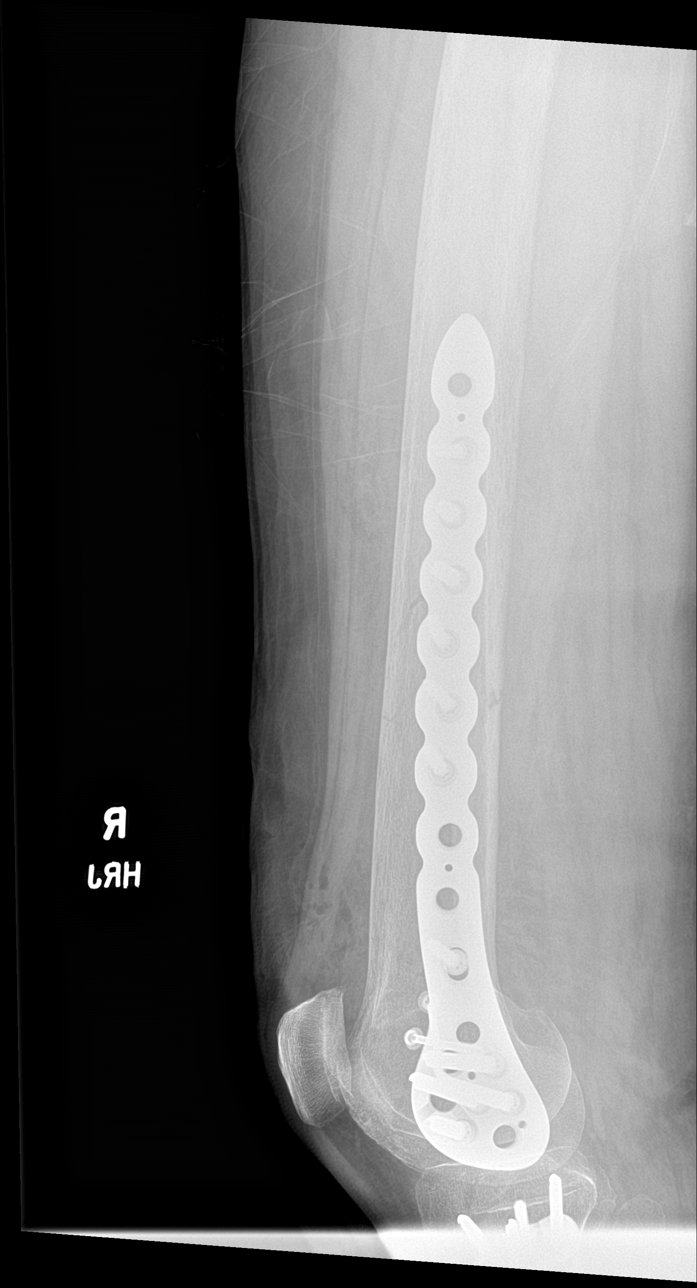

[femur lat (2 of 2)]
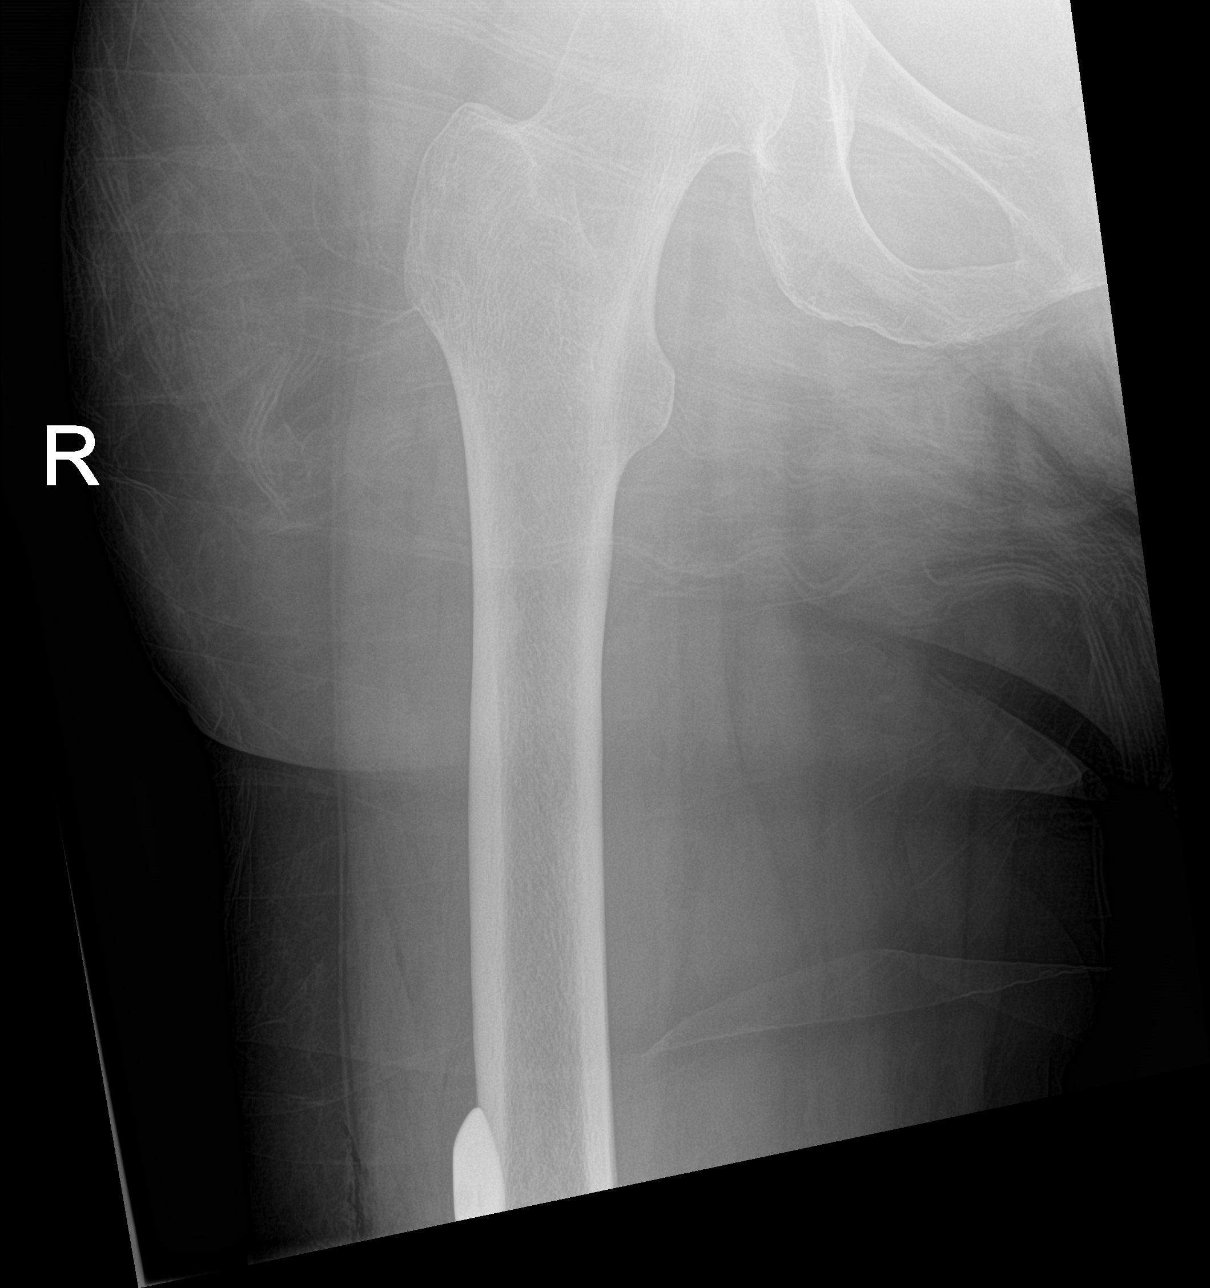

[4 of 4 positions shown; findings below may reference images not displayed]

FINDINGS: Lateral fixation sideplate is noted with multiple fixation screws.
Fracture fragments are in near anatomic alignment. No other focal
abnormality is noted.
IMPRESSION: Status post ORIF of right femoral fracture.

## 2021-06-06 IMAGING — DX DG CHEST 1V PORT
1 series · 1 of 1 positions shown · non-contrast
Comparison: September 13, 2018

CLINICAL DATA: Preop, lung cancer

EXAM:
PORTABLE CHEST 1 VIEW

[chest ap]
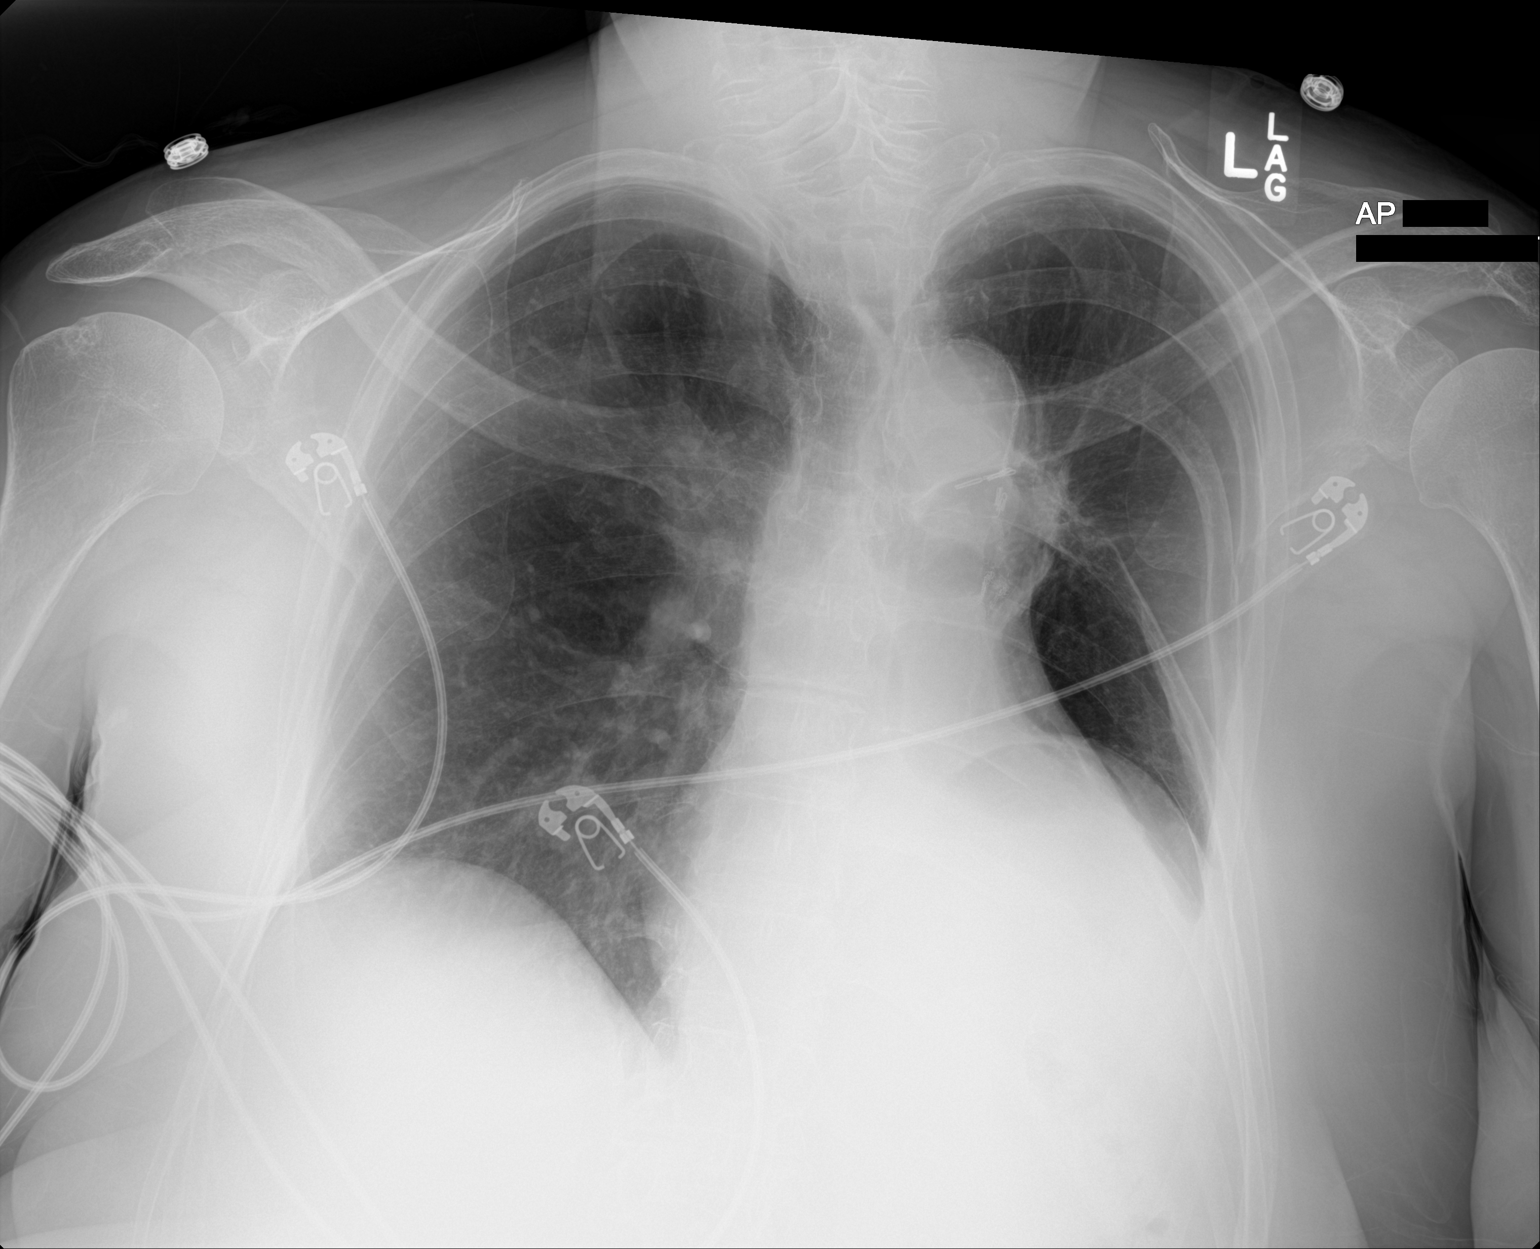

[1 of 1 positions shown; findings below may reference images not displayed]

FINDINGS: The cardiomediastinal silhouette is unchanged from prior exam.
Aortic knob calcifications. Again noted is left hilar surgical clips
and postsurgical changes. Elevation of the left hemidiaphragm. The
right lung is clear.
IMPRESSION: Stable postsurgical changes.  No acute cardiopulmonary process.

## 2021-06-06 IMAGING — DX DG KNEE 1-2V PORT*R*
3 series · 3 of 3 positions shown · non-contrast
Comparison: Intraop films from earlier in the same day.

CLINICAL DATA: Status post ORIF of distal femoral and proximal
tibial fractures

EXAM:
PORTABLE RIGHT KNEE - 1-2 VIEW

[knee ap (1 of 2)]
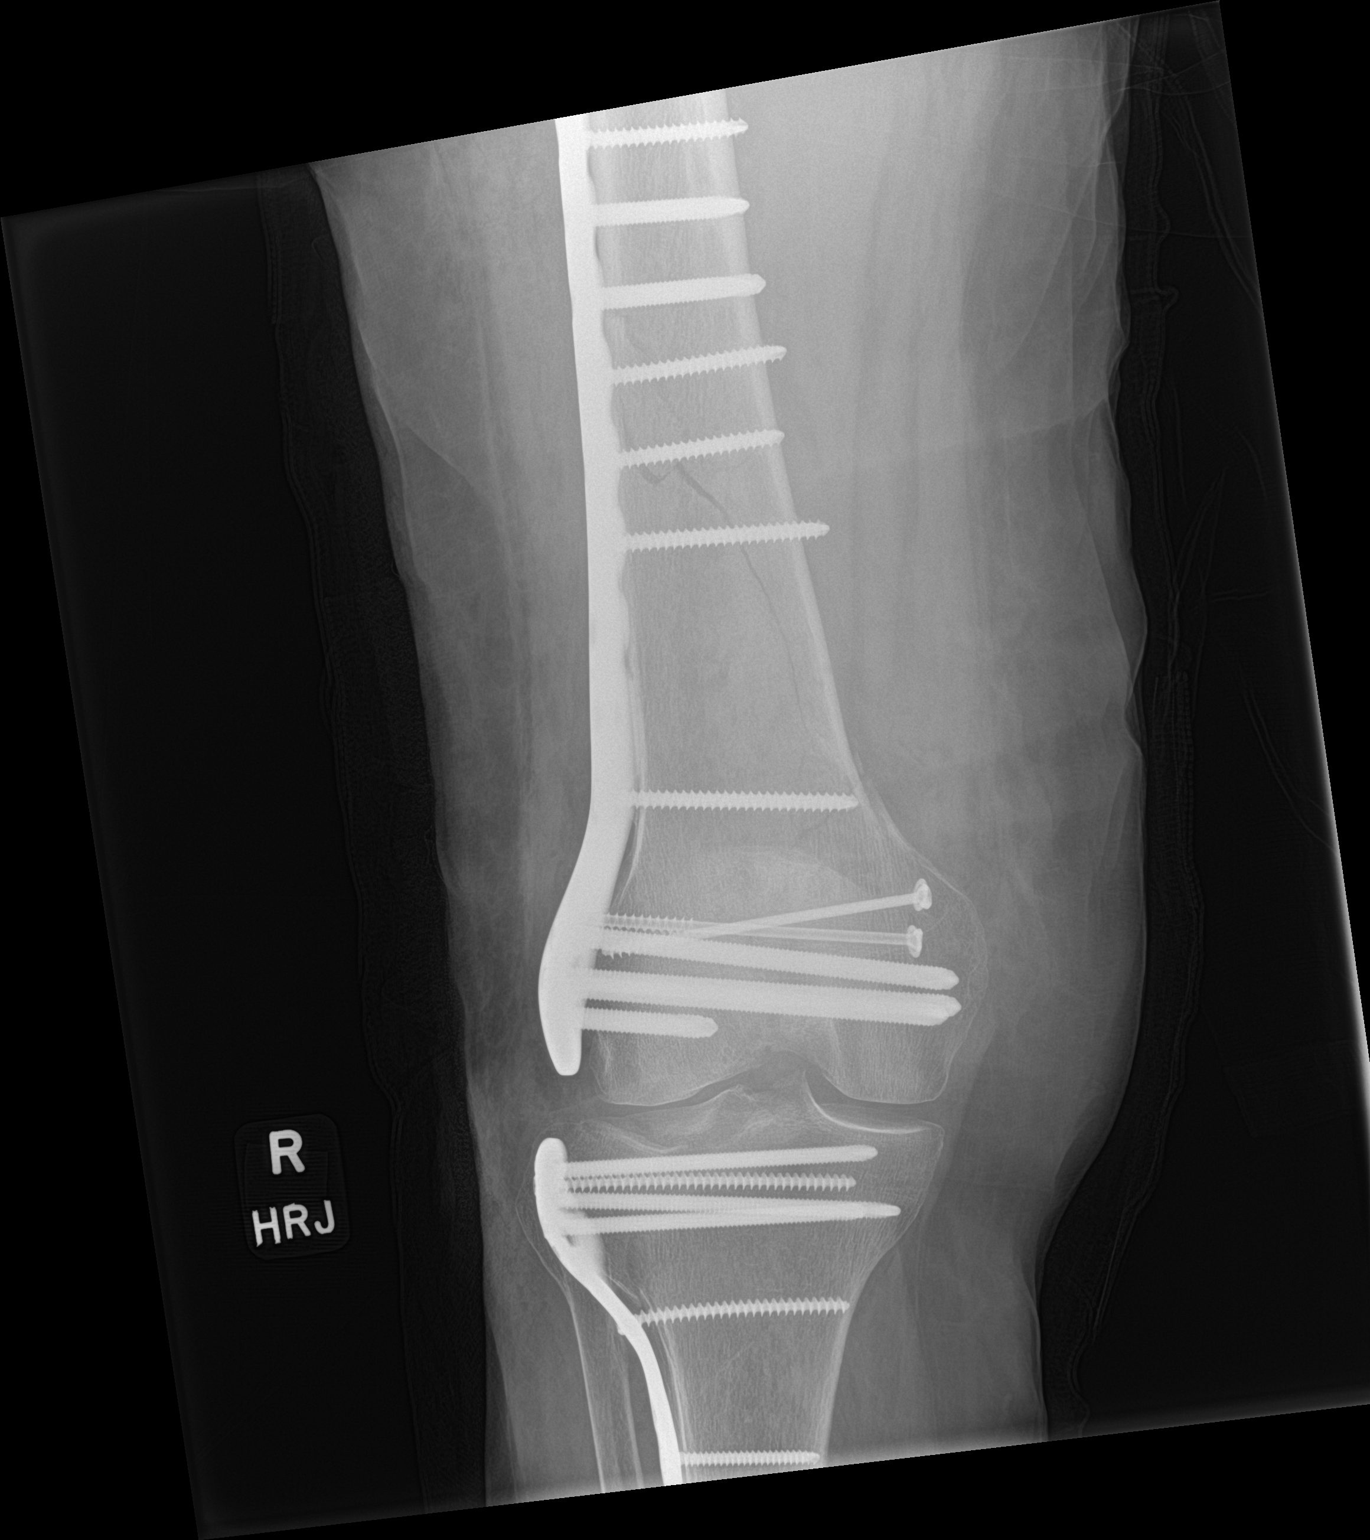

[knee lat]
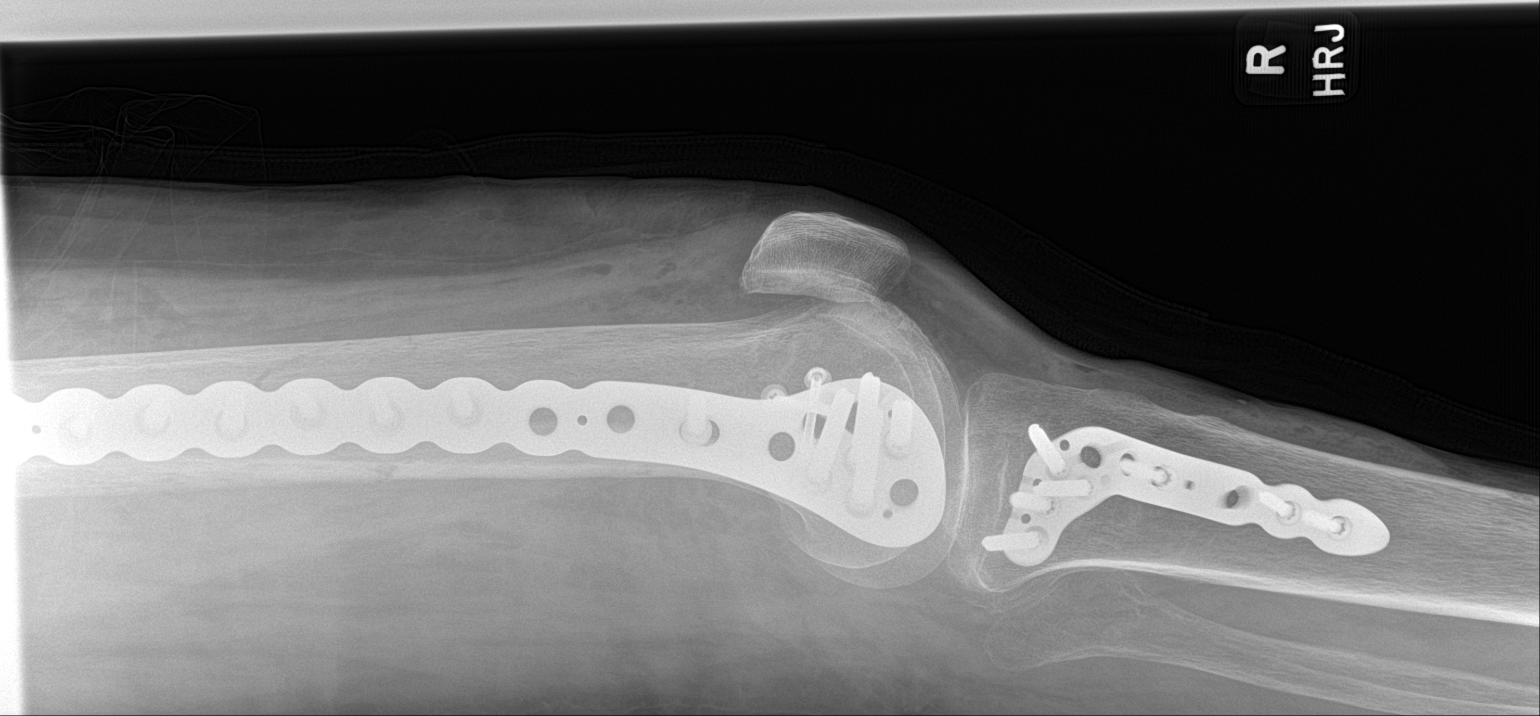

[knee ap (2 of 2)]
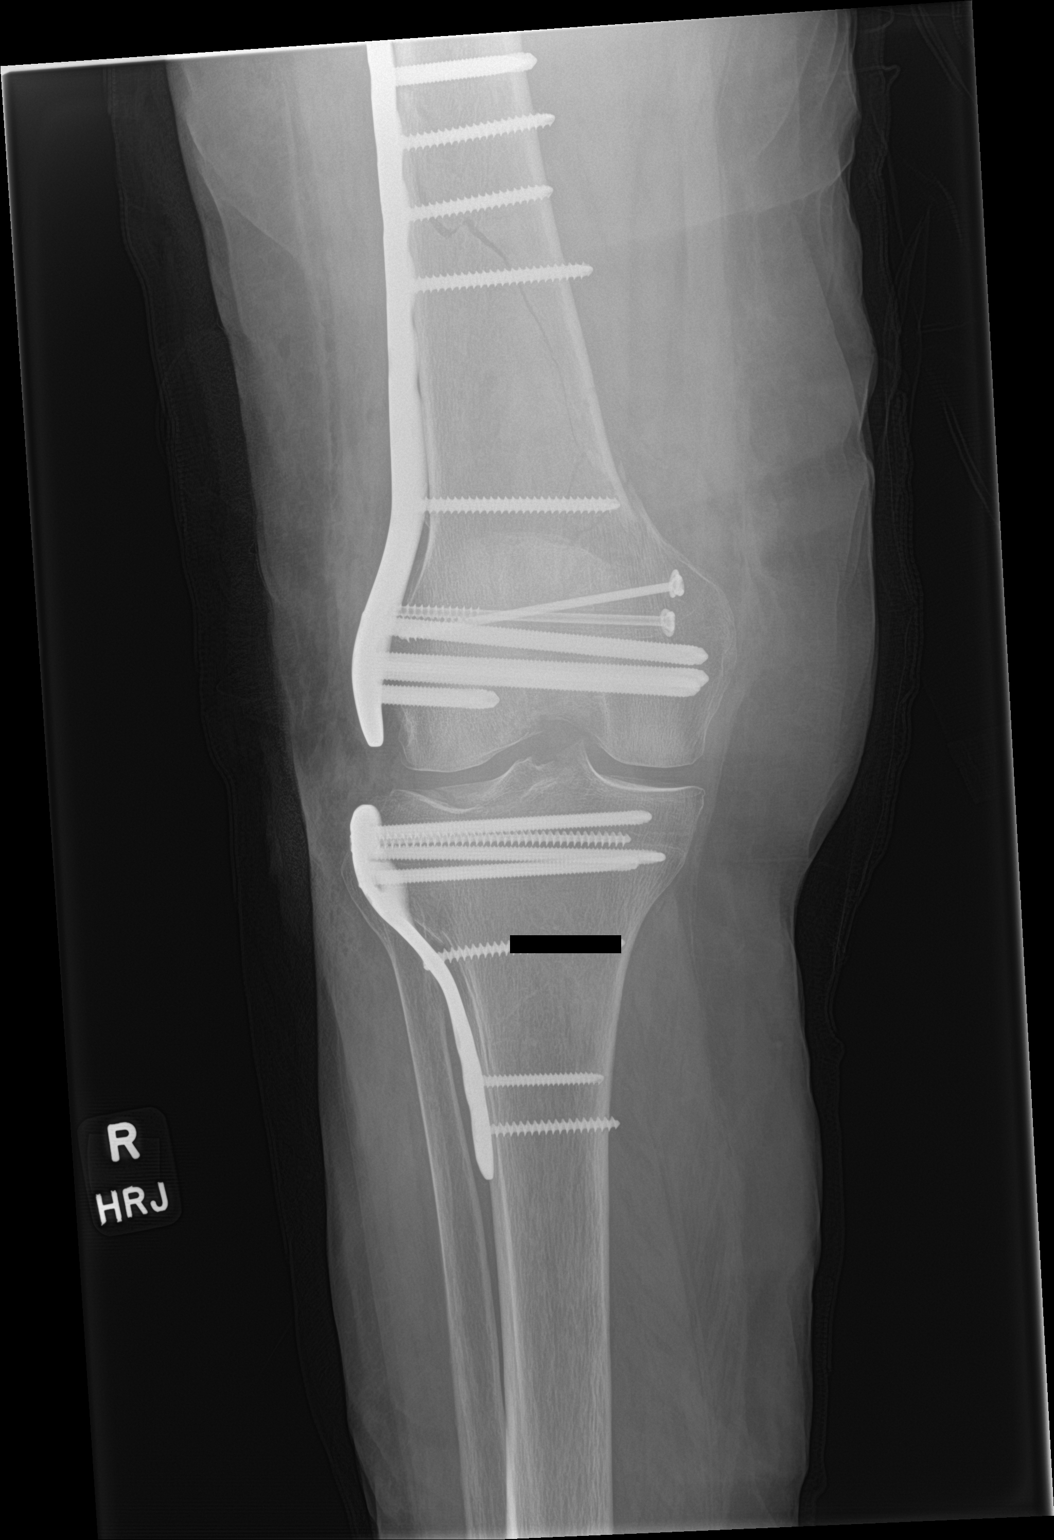

[3 of 3 positions shown; findings below may reference images not displayed]

FINDINGS: Lateral fixation side plates are noted along the distal femur and
proximal tibia with multiple fixation screws. Fracture fragments are
in near anatomic alignment.
IMPRESSION: ORIF of distal femoral and proximal tibial fractures.

## 2021-06-06 IMAGING — CT CT KNEE*R* W/O CM
3 series · 13 of 33 positions shown, 16 images · non-contrast
Comparison: Right knee radiographs 06/10/2019

CLINICAL DATA: Complex knee fractures.

EXAM:
CT OF THE right KNEE WITHOUT CONTRAST
TECHNIQUE: Multidetector CT imaging of the right knee was performed according
to the standard protocol. Multiplanar CT image reconstructions were
also generated.

[Series 5: axial st · axial · 0.40mm/px · z∈[+753,+887]mm · 5 of 99 slices shown, 7 images]
[im 16/99  soft-tissue]
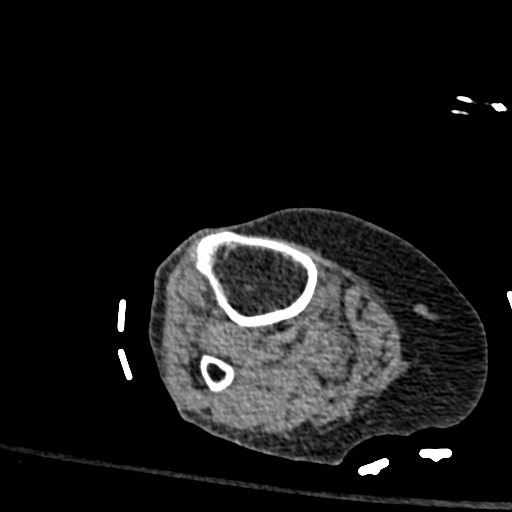
[im 16/99  bone]
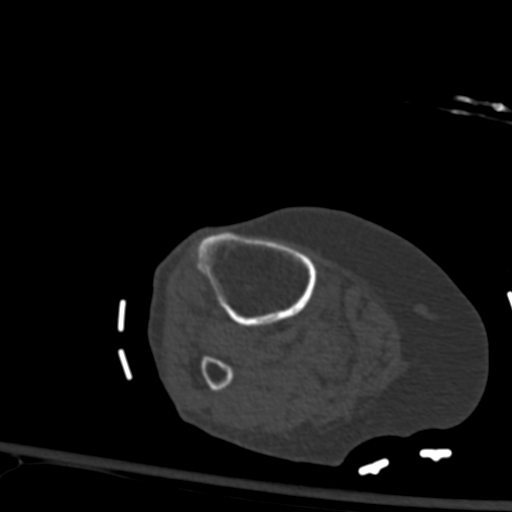
[im 31/99  bone]
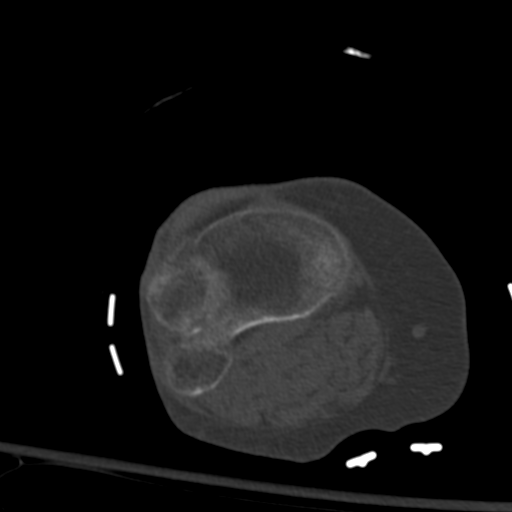
[im 53/99  bone]
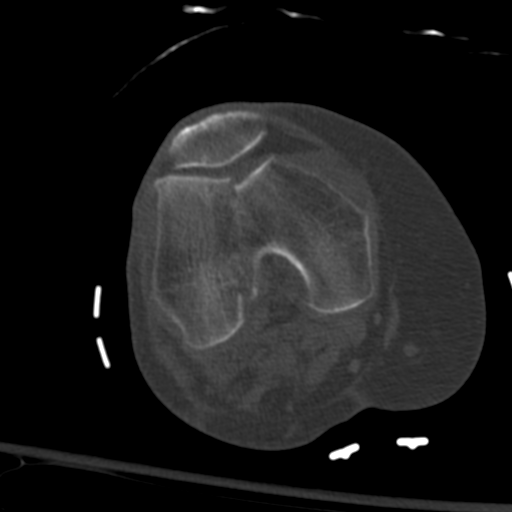
[im 68/99  bone]
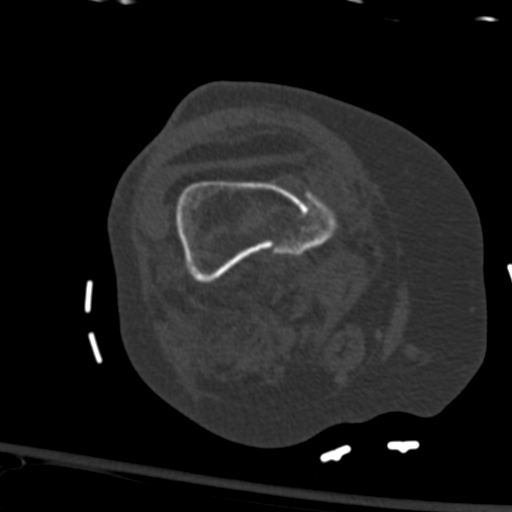
[im 83/99  soft-tissue]
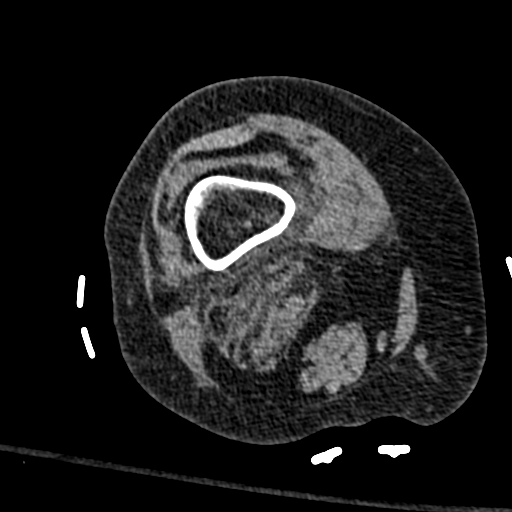
[im 83/99  bone]
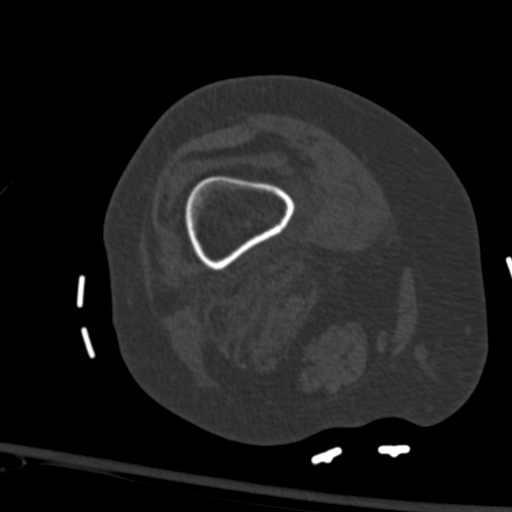

[Series 8: coronal st · coronal · 0.30mm/px · 3 of 73 slices shown]
[im 15/73  bone]
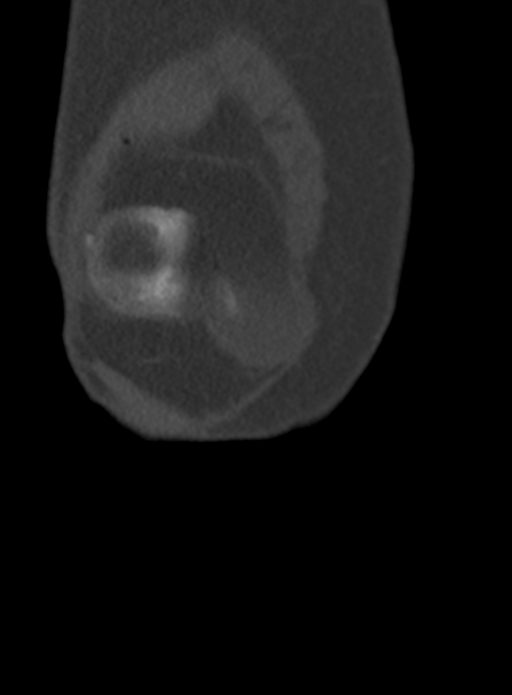
[im 29/73  bone]
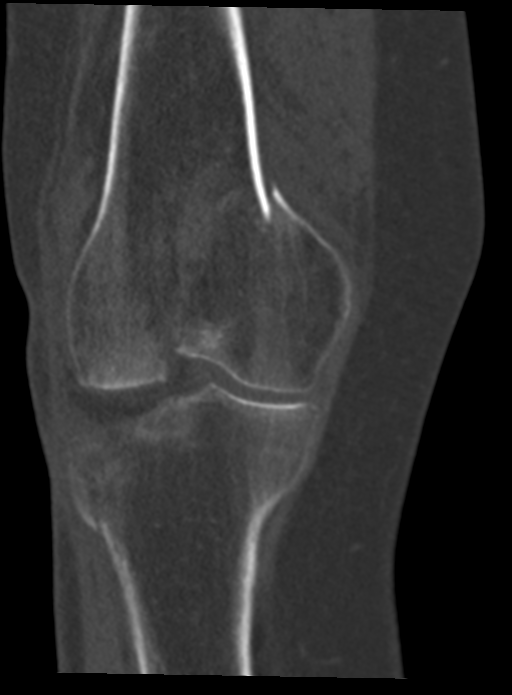
[im 44/73  bone]
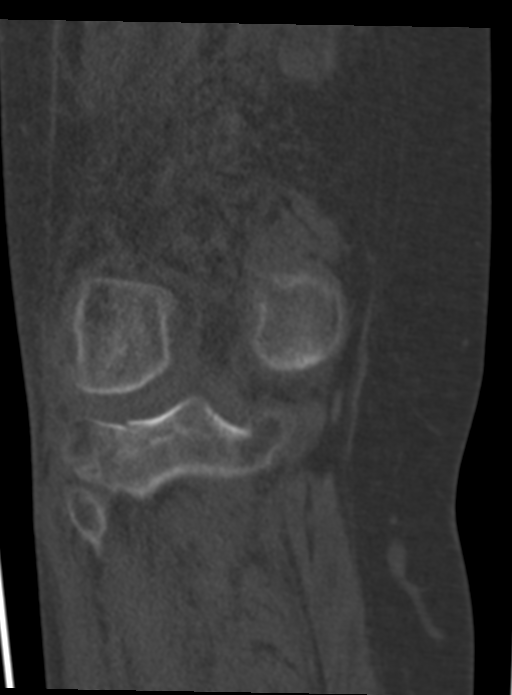

[Series 9: sagittal st · sagittal · 0.32mm/px · 5 of 71 slices shown, 6 images]
[im 24/71  bone]
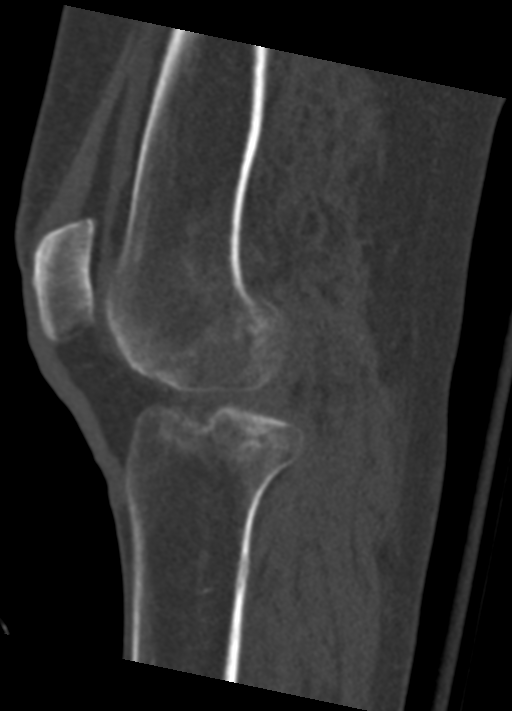
[im 30/71  bone]
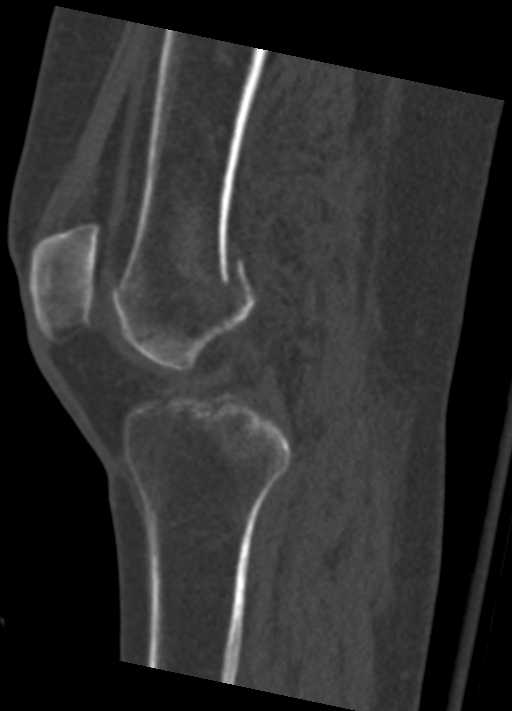
[im 36/71  soft-tissue]
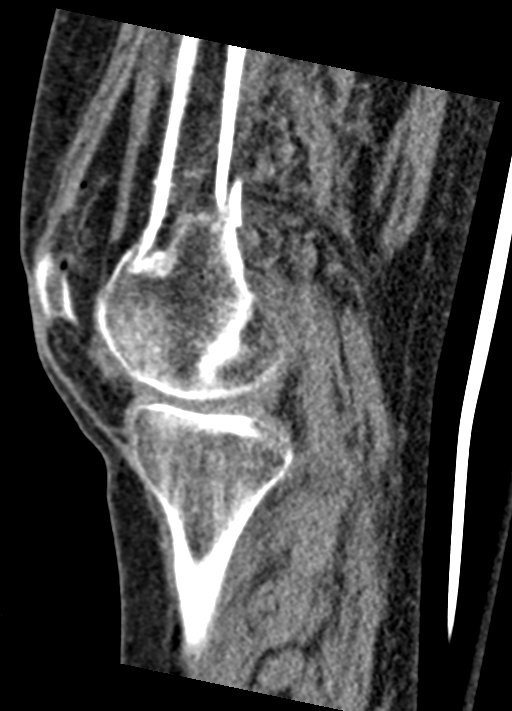
[im 36/71  bone]
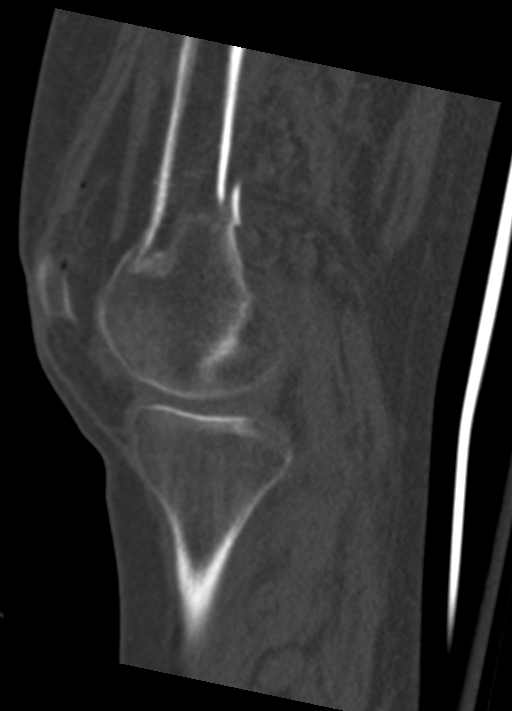
[im 41/71  bone]
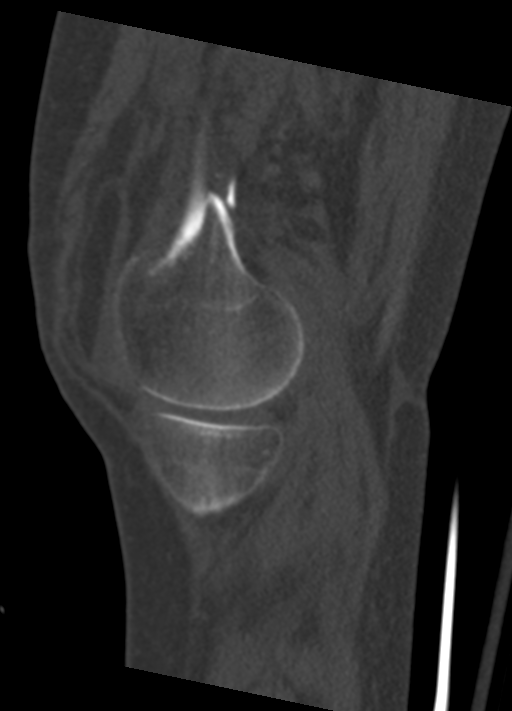
[im 47/71  bone]
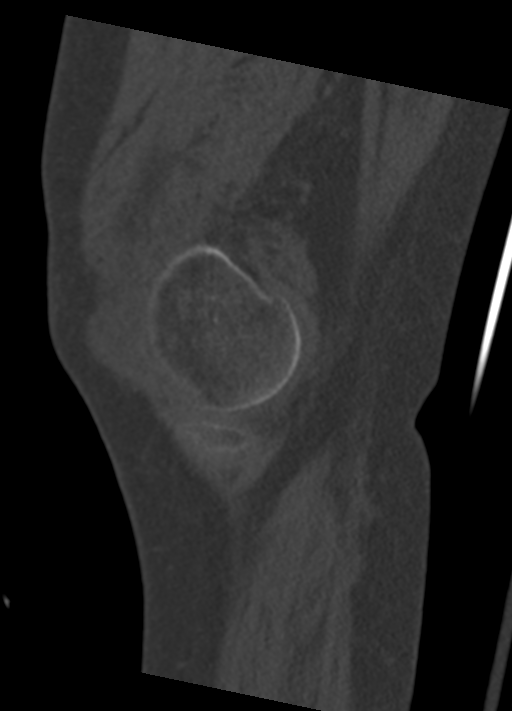

[13 of 33 positions shown; findings below may reference images not displayed]

FINDINGS: Oblique coursing mildly displaced fracture extending from the medial
supracondylar region through the condylar notch. Maximum
displacement along the articular surface is approximately 6 mm.

Associated die punch type fracture involving the lateral tibial
plateau with approximately 5 mm of depression and mild comminution.
The fracture extends out through the lateral cortex of the tibia
near the metadiaphyseal junction.

Associated moderate to large lipohemarthrosis is noted.

Fairly significant underlying osteoporosis. The patella and fibula
are intact.

Grossly by CT the cruciate and collateral ligaments appear intact.
The quadriceps and patellar tendons are intact.
IMPRESSION: 1. Oblique coursing intra-articular medial femoral condylar fracture
as detailed above.
2. Mildly depressed die punch type fracture involving the lateral
tibial plateau.

## 2021-06-06 IMAGING — RF DG KNEE 1-2V*R*
1 series · 6 of 6 positions shown · non-contrast
Comparison: None.

CLINICAL DATA: Distal femoral and proximal tibial fractures

EXAM:
RIGHT KNEE - 1-2 VIEW; DG C-ARM 1-60 MIN

[Series 1: run · 6 of 6 slices shown]
[im 1/6]
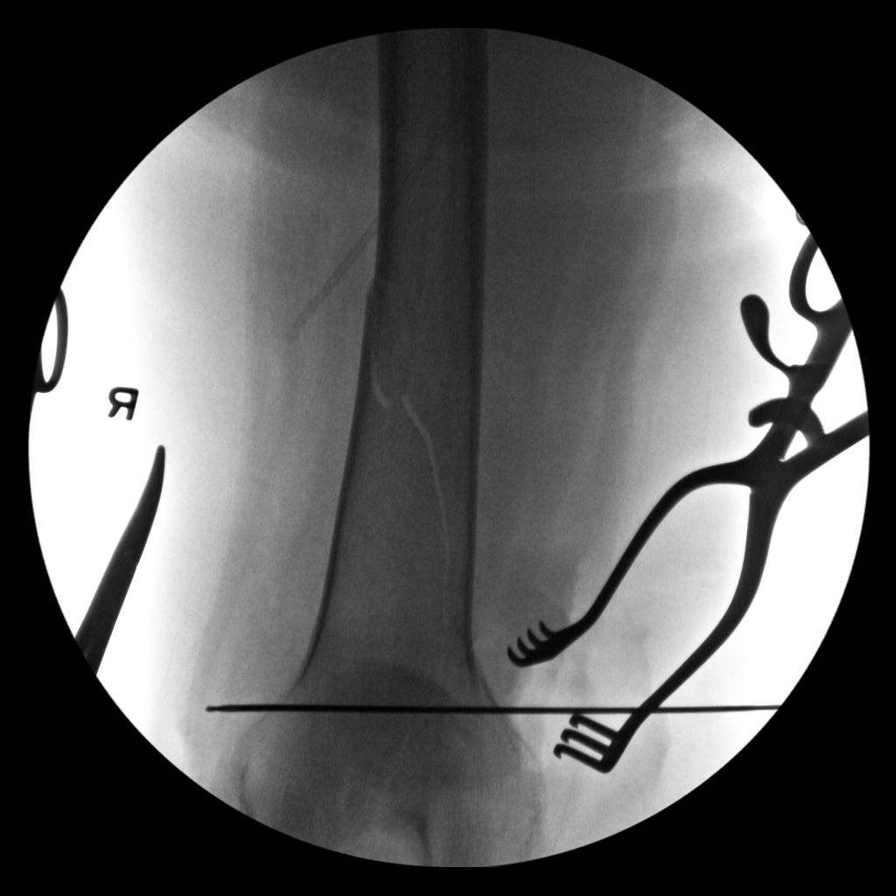
[im 2/6]
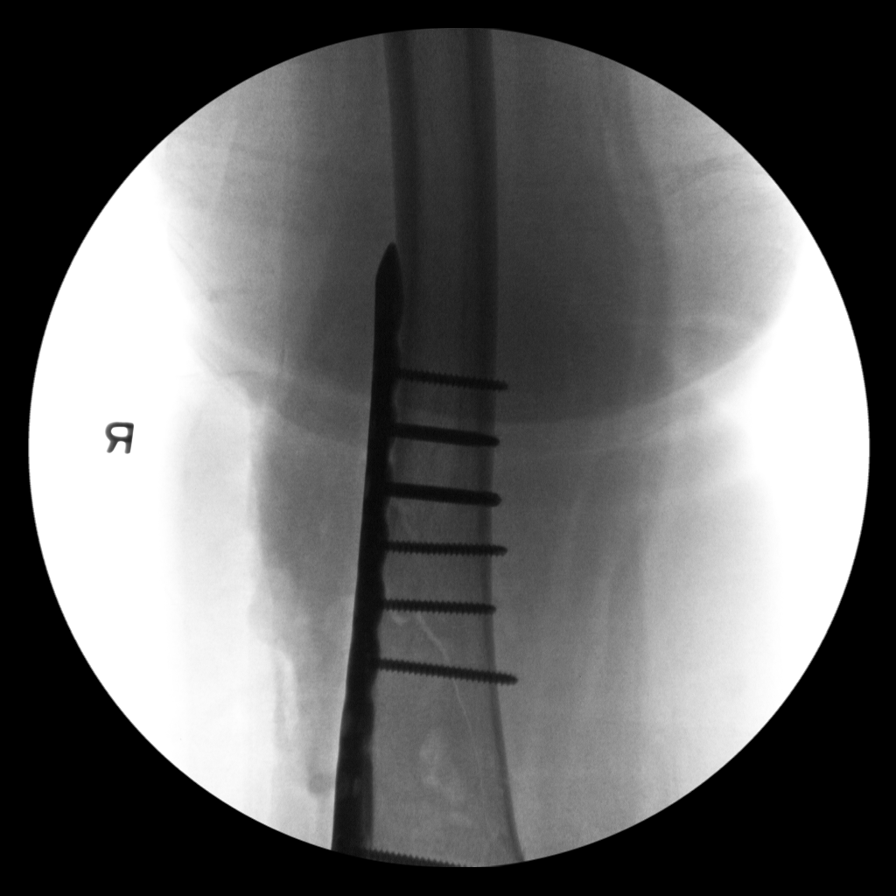
[im 3/6]
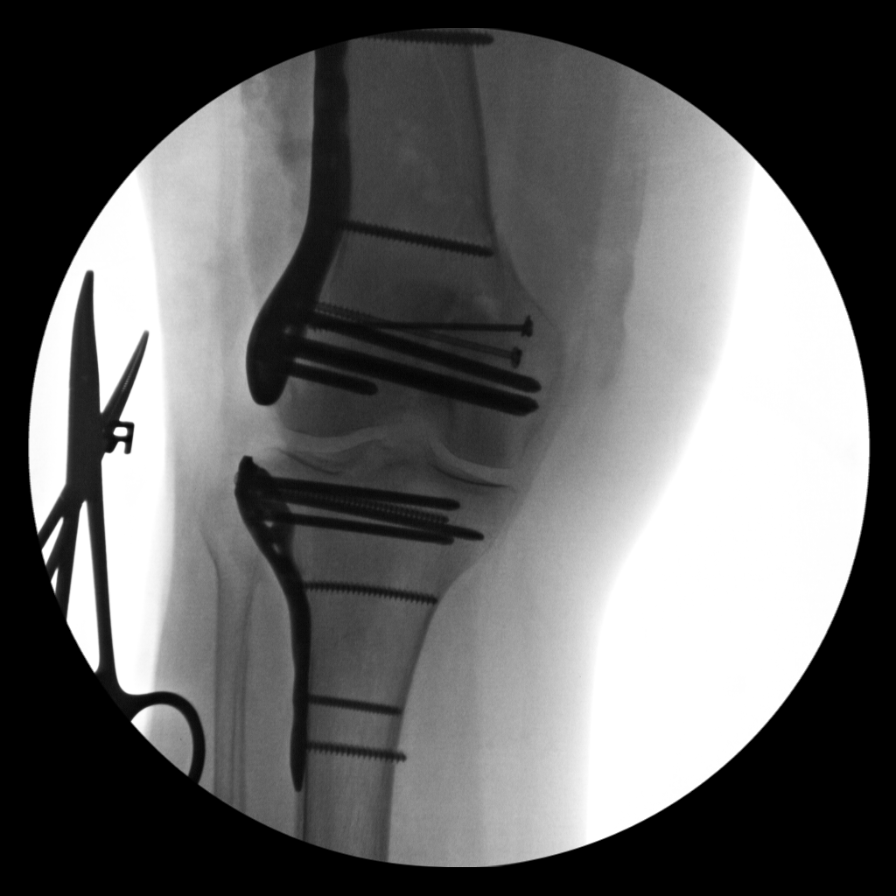
[im 4/6]
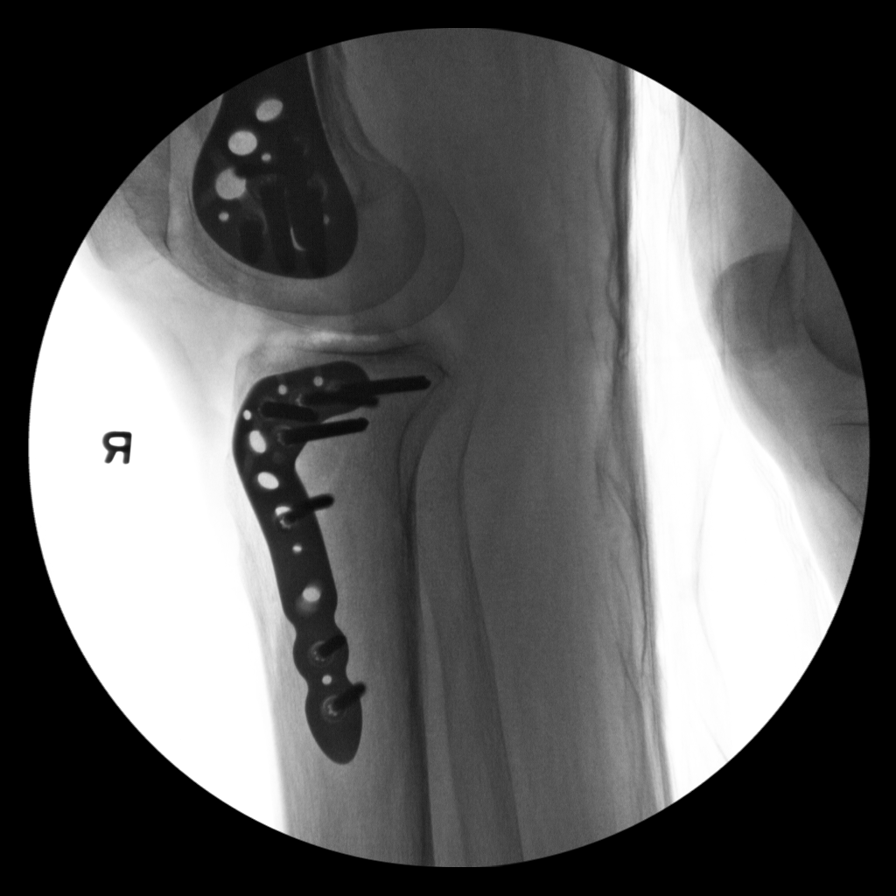
[im 5/6]
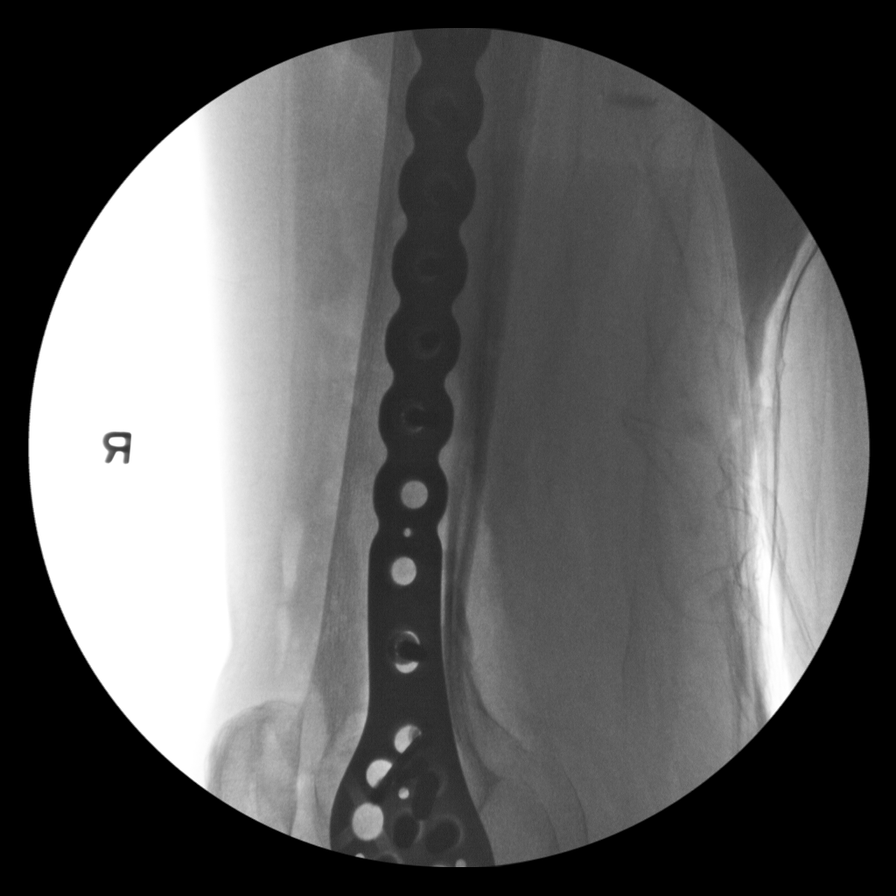
[im 6/6]
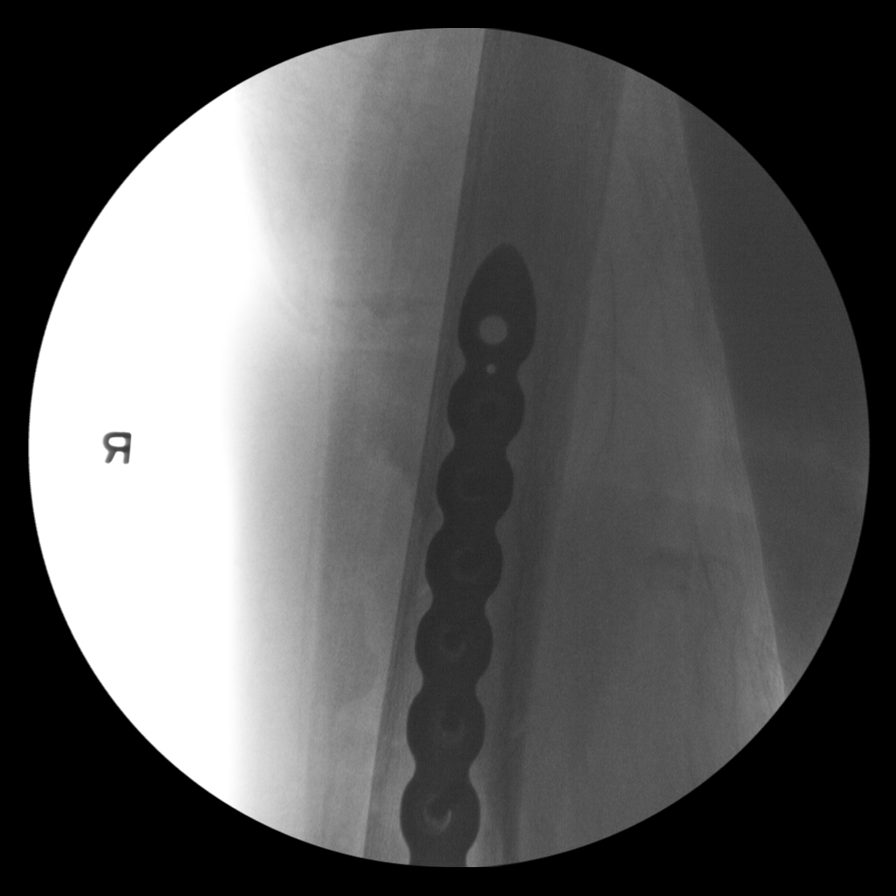

[6 of 6 positions shown; findings below may reference images not displayed]

FLUOROSCOPY TIME:  Fluoroscopy Time:  1 minutes 31 seconds

Radiation Exposure Index (if provided by the fluoroscopic device):
Not available

Number of Acquired Spot Images: 6
FINDINGS: Distal femoral fracture is again identified. Fixation sideplate is
subsequently placed with multiple fixation screws along the lateral
aspect. Fixation sideplate along the lateral aspect of the tibia is
noted as well. Fracture fragments are in near anatomic alignment.
IMPRESSION: ORIF of distal femoral and proximal tibial fractures.

## 2021-06-06 IMAGING — RF DG C-ARM 1-60 MIN
1 series · 6 of 6 positions shown · non-contrast
Comparison: None.

CLINICAL DATA: Distal femoral and proximal tibial fractures

EXAM:
RIGHT KNEE - 1-2 VIEW; DG C-ARM 1-60 MIN

[Series 1: run · 6 of 6 slices shown]
[im 1/6]
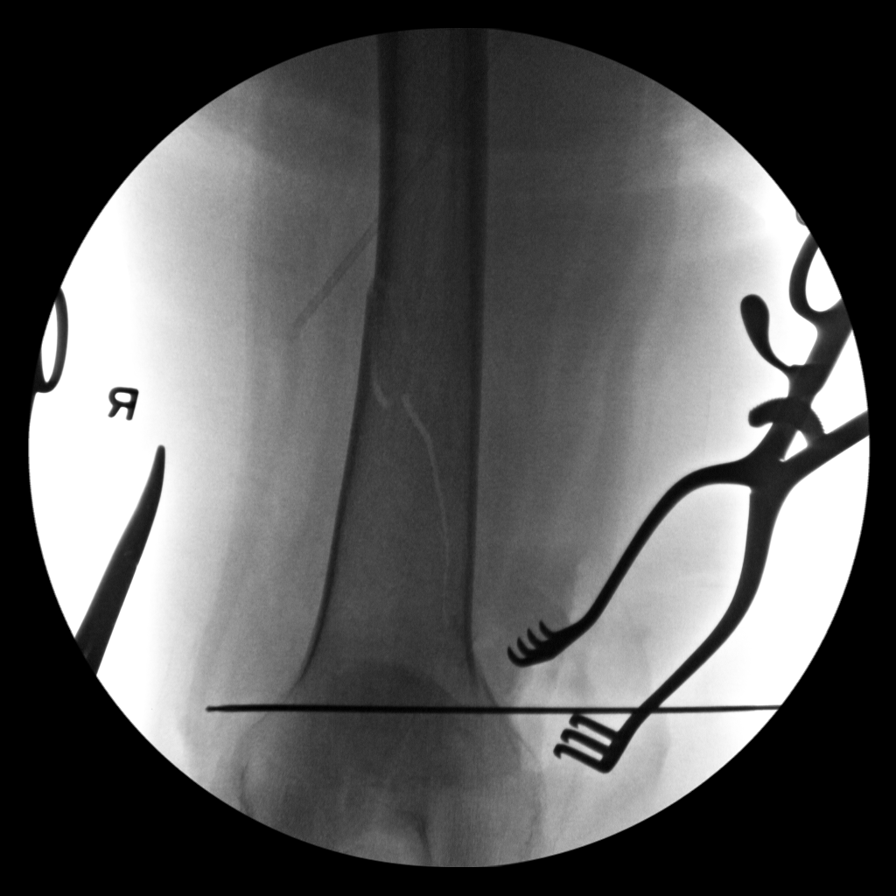
[im 2/6]
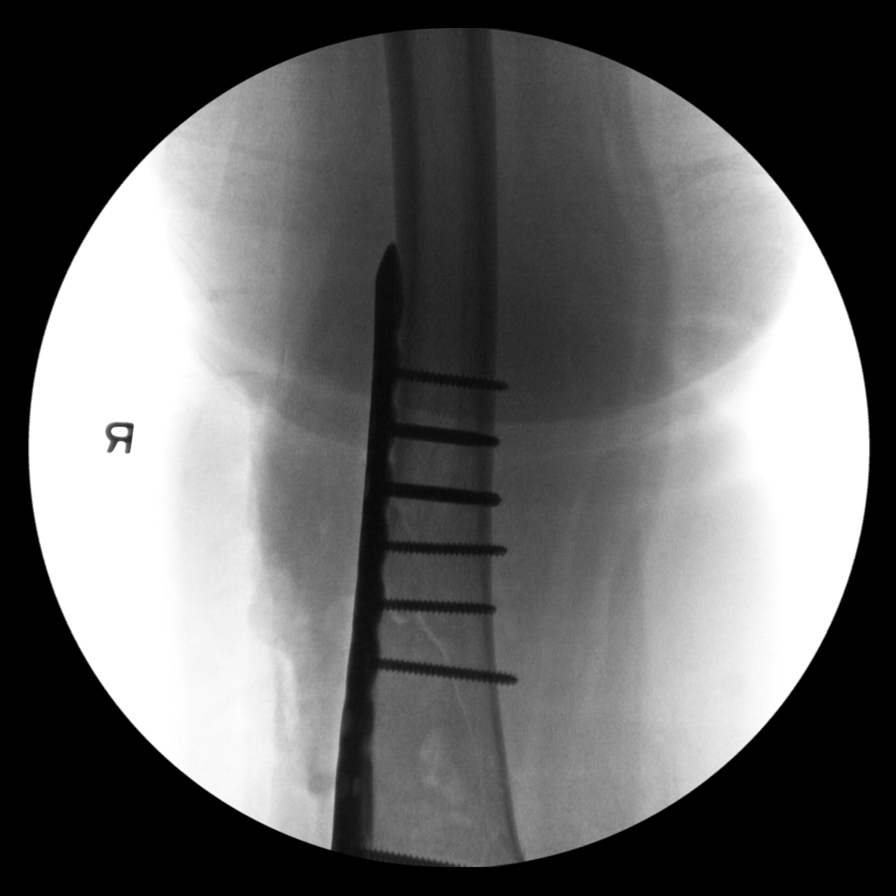
[im 3/6]
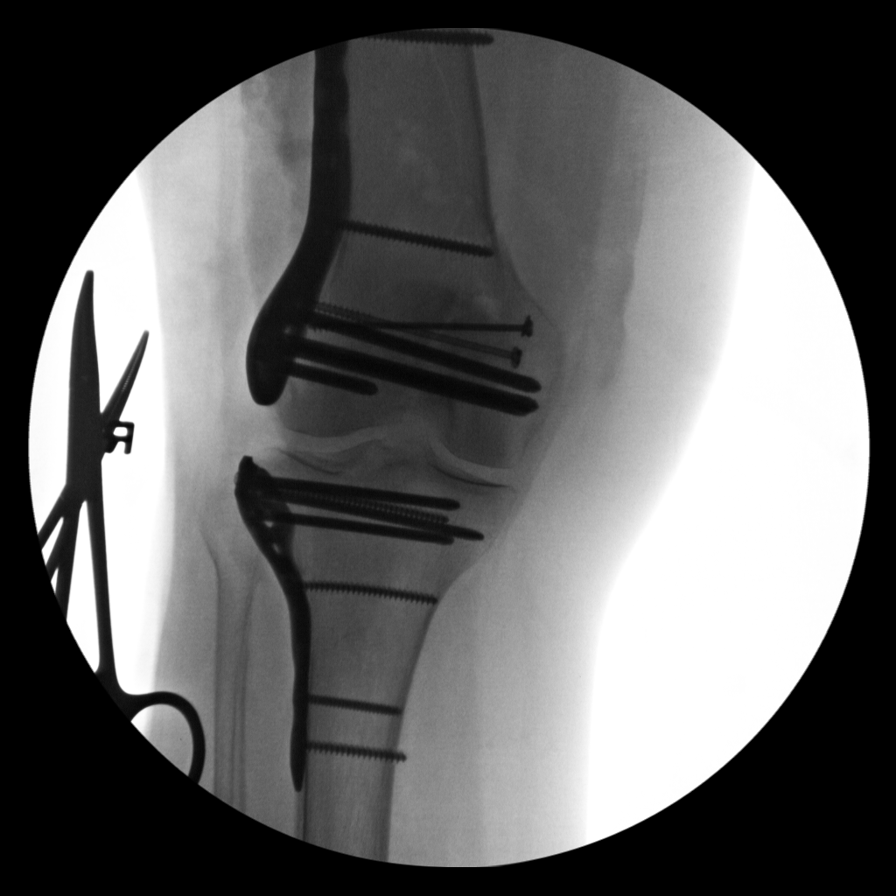
[im 4/6]
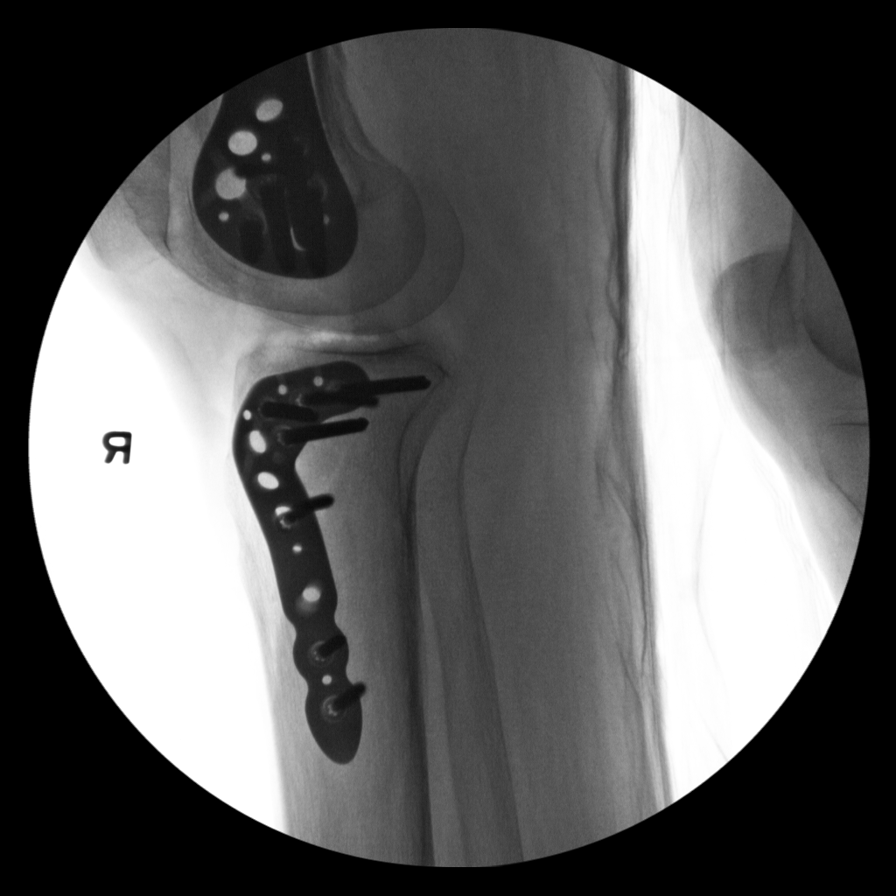
[im 5/6]
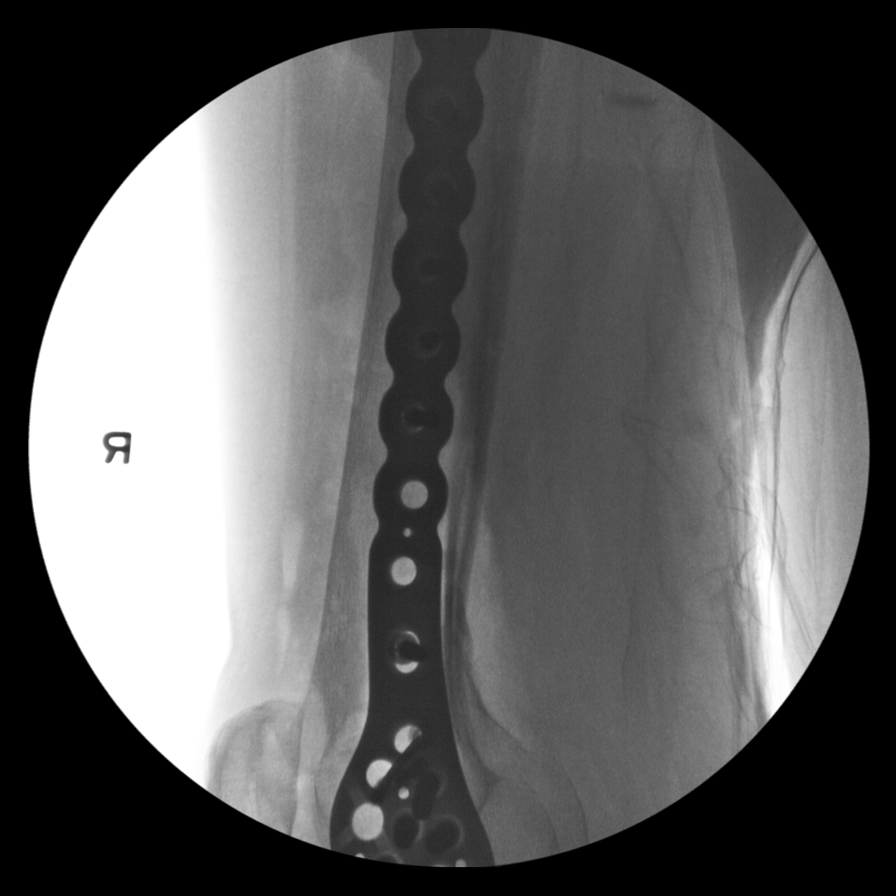
[im 6/6]
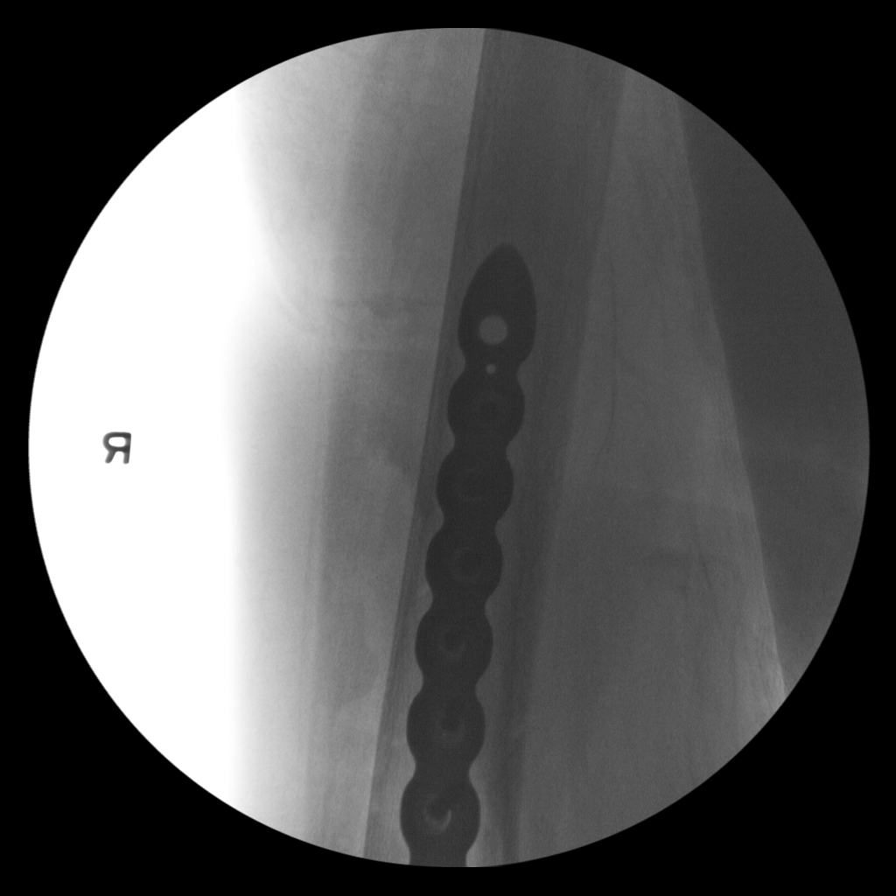

[6 of 6 positions shown; findings below may reference images not displayed]

FLUOROSCOPY TIME:  Fluoroscopy Time:  1 minutes 31 seconds

Radiation Exposure Index (if provided by the fluoroscopic device):
Not available

Number of Acquired Spot Images: 6
FINDINGS: Distal femoral fracture is again identified. Fixation sideplate is
subsequently placed with multiple fixation screws along the lateral
aspect. Fixation sideplate along the lateral aspect of the tibia is
noted as well. Fracture fragments are in near anatomic alignment.
IMPRESSION: ORIF of distal femoral and proximal tibial fractures.

## 2021-06-07 DIAGNOSIS — I2510 Atherosclerotic heart disease of native coronary artery without angina pectoris: Secondary | ICD-10-CM | POA: Diagnosis not present

## 2021-06-07 DIAGNOSIS — D630 Anemia in neoplastic disease: Secondary | ICD-10-CM | POA: Diagnosis not present

## 2021-06-07 DIAGNOSIS — I5041 Acute combined systolic (congestive) and diastolic (congestive) heart failure: Secondary | ICD-10-CM | POA: Diagnosis not present

## 2021-06-07 DIAGNOSIS — C3411 Malignant neoplasm of upper lobe, right bronchus or lung: Secondary | ICD-10-CM | POA: Diagnosis not present

## 2021-06-07 DIAGNOSIS — C7931 Secondary malignant neoplasm of brain: Secondary | ICD-10-CM | POA: Diagnosis not present

## 2021-06-07 DIAGNOSIS — I480 Paroxysmal atrial fibrillation: Secondary | ICD-10-CM | POA: Diagnosis not present

## 2021-06-07 DIAGNOSIS — I110 Hypertensive heart disease with heart failure: Secondary | ICD-10-CM | POA: Diagnosis not present

## 2021-06-07 DIAGNOSIS — I471 Supraventricular tachycardia: Secondary | ICD-10-CM | POA: Diagnosis not present

## 2021-06-07 DIAGNOSIS — I420 Dilated cardiomyopathy: Secondary | ICD-10-CM | POA: Diagnosis not present

## 2021-06-09 DIAGNOSIS — I420 Dilated cardiomyopathy: Secondary | ICD-10-CM | POA: Diagnosis not present

## 2021-06-09 DIAGNOSIS — C7931 Secondary malignant neoplasm of brain: Secondary | ICD-10-CM | POA: Diagnosis not present

## 2021-06-09 DIAGNOSIS — I2510 Atherosclerotic heart disease of native coronary artery without angina pectoris: Secondary | ICD-10-CM | POA: Diagnosis not present

## 2021-06-09 DIAGNOSIS — I110 Hypertensive heart disease with heart failure: Secondary | ICD-10-CM | POA: Diagnosis not present

## 2021-06-09 DIAGNOSIS — I5041 Acute combined systolic (congestive) and diastolic (congestive) heart failure: Secondary | ICD-10-CM | POA: Diagnosis not present

## 2021-06-09 DIAGNOSIS — I480 Paroxysmal atrial fibrillation: Secondary | ICD-10-CM | POA: Diagnosis not present

## 2021-06-09 DIAGNOSIS — D630 Anemia in neoplastic disease: Secondary | ICD-10-CM | POA: Diagnosis not present

## 2021-06-09 DIAGNOSIS — I471 Supraventricular tachycardia: Secondary | ICD-10-CM | POA: Diagnosis not present

## 2021-06-09 DIAGNOSIS — C3411 Malignant neoplasm of upper lobe, right bronchus or lung: Secondary | ICD-10-CM | POA: Diagnosis not present

## 2021-06-11 IMAGING — DX DG CHEST 1V PORT
1 series · 1 of 1 positions shown · non-contrast
Comparison: 06/11/2019

CLINICAL DATA: New onset cough

EXAM:
PORTABLE CHEST 1 VIEW

[chest]
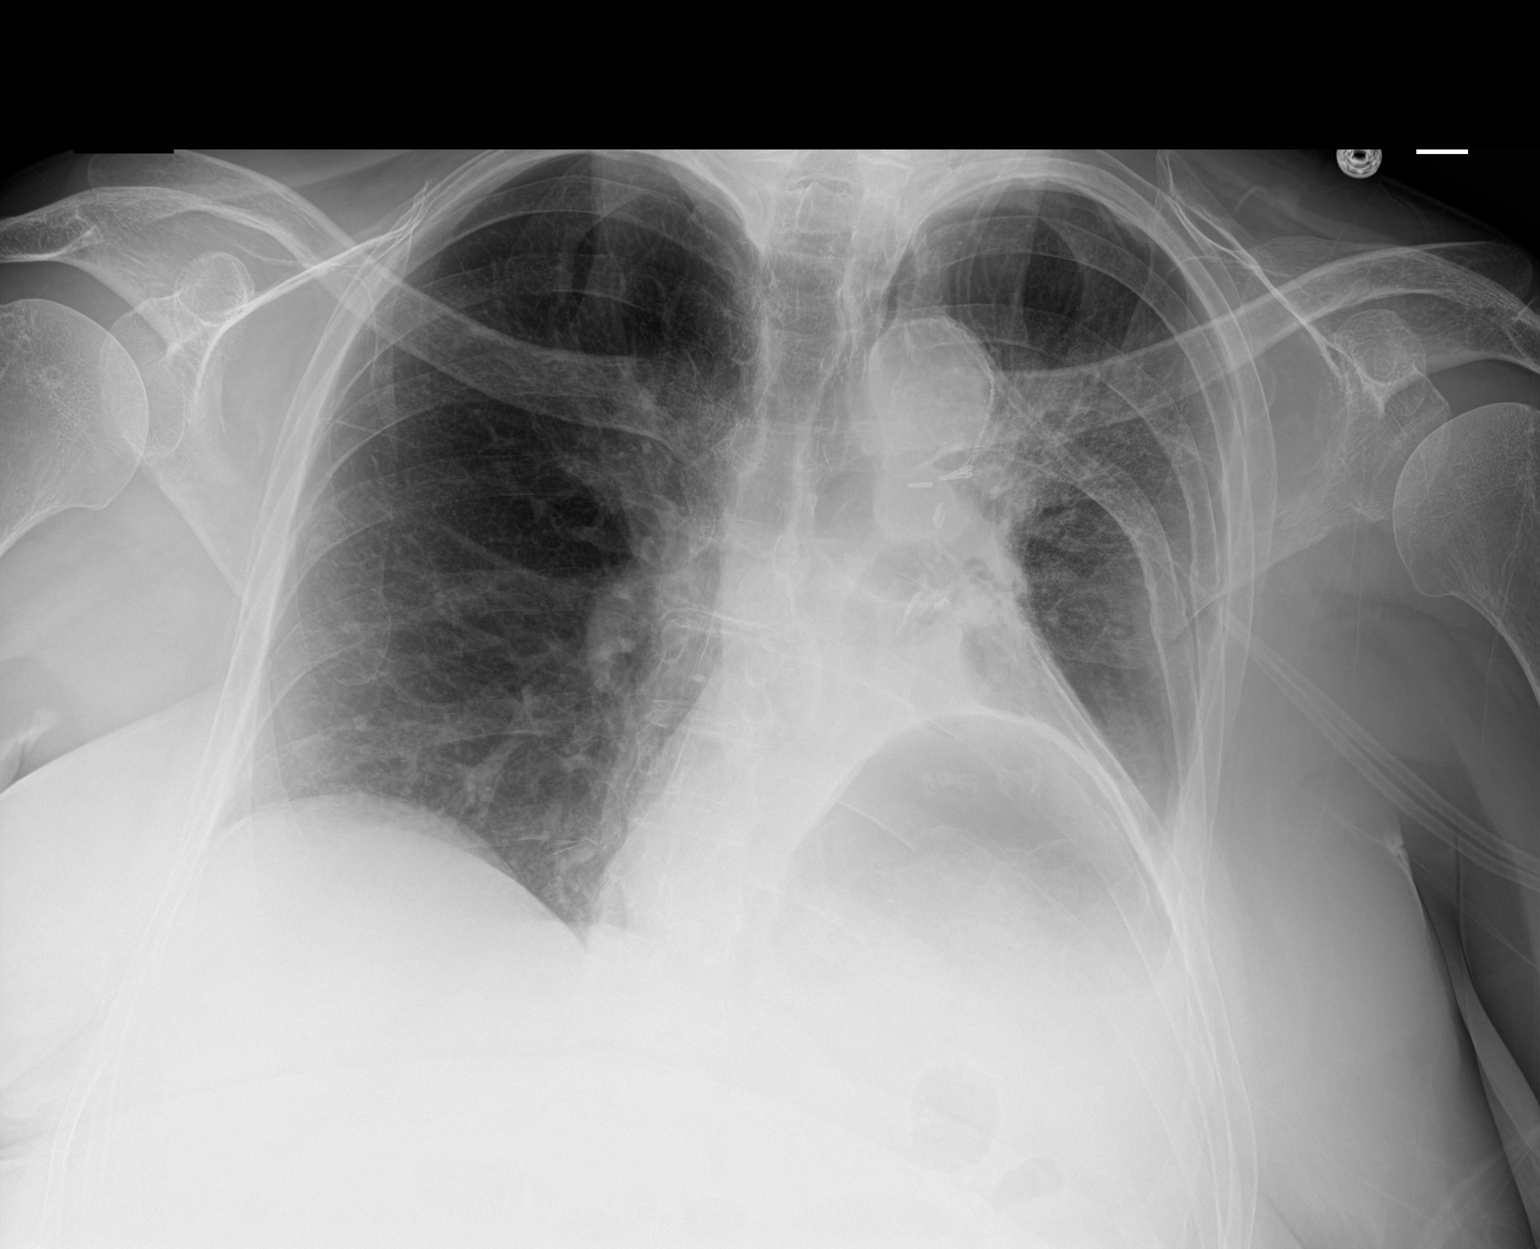

[1 of 1 positions shown; findings below may reference images not displayed]

FINDINGS: Elevation of the left hemidiaphragm with left base atelectasis.
Right lung clear. Heart is borderline in size. No visible effusions
or acute bony abnormality.
IMPRESSION: Stable elevation of the left hemidiaphragm with left base
atelectasis.

## 2021-06-12 DIAGNOSIS — I2510 Atherosclerotic heart disease of native coronary artery without angina pectoris: Secondary | ICD-10-CM | POA: Diagnosis not present

## 2021-06-12 DIAGNOSIS — I471 Supraventricular tachycardia: Secondary | ICD-10-CM | POA: Diagnosis not present

## 2021-06-12 DIAGNOSIS — I480 Paroxysmal atrial fibrillation: Secondary | ICD-10-CM | POA: Diagnosis not present

## 2021-06-12 DIAGNOSIS — C3411 Malignant neoplasm of upper lobe, right bronchus or lung: Secondary | ICD-10-CM | POA: Diagnosis not present

## 2021-06-12 DIAGNOSIS — D630 Anemia in neoplastic disease: Secondary | ICD-10-CM | POA: Diagnosis not present

## 2021-06-12 DIAGNOSIS — C7931 Secondary malignant neoplasm of brain: Secondary | ICD-10-CM | POA: Diagnosis not present

## 2021-06-12 DIAGNOSIS — I5041 Acute combined systolic (congestive) and diastolic (congestive) heart failure: Secondary | ICD-10-CM | POA: Diagnosis not present

## 2021-06-12 DIAGNOSIS — I420 Dilated cardiomyopathy: Secondary | ICD-10-CM | POA: Diagnosis not present

## 2021-06-12 DIAGNOSIS — I110 Hypertensive heart disease with heart failure: Secondary | ICD-10-CM | POA: Diagnosis not present

## 2021-06-13 DIAGNOSIS — I420 Dilated cardiomyopathy: Secondary | ICD-10-CM | POA: Diagnosis not present

## 2021-06-13 DIAGNOSIS — I471 Supraventricular tachycardia: Secondary | ICD-10-CM | POA: Diagnosis not present

## 2021-06-13 DIAGNOSIS — D630 Anemia in neoplastic disease: Secondary | ICD-10-CM | POA: Diagnosis not present

## 2021-06-13 DIAGNOSIS — C7931 Secondary malignant neoplasm of brain: Secondary | ICD-10-CM | POA: Diagnosis not present

## 2021-06-13 DIAGNOSIS — C3411 Malignant neoplasm of upper lobe, right bronchus or lung: Secondary | ICD-10-CM | POA: Diagnosis not present

## 2021-06-13 DIAGNOSIS — I480 Paroxysmal atrial fibrillation: Secondary | ICD-10-CM | POA: Diagnosis not present

## 2021-06-13 DIAGNOSIS — I5041 Acute combined systolic (congestive) and diastolic (congestive) heart failure: Secondary | ICD-10-CM | POA: Diagnosis not present

## 2021-06-13 DIAGNOSIS — I2510 Atherosclerotic heart disease of native coronary artery without angina pectoris: Secondary | ICD-10-CM | POA: Diagnosis not present

## 2021-06-13 DIAGNOSIS — I110 Hypertensive heart disease with heart failure: Secondary | ICD-10-CM | POA: Diagnosis not present

## 2021-06-14 ENCOUNTER — Telehealth: Payer: Self-pay | Admitting: Medical Oncology

## 2021-06-14 NOTE — Telephone Encounter (Signed)
[facility] received a call from [facility] that pt needs Hospice.   I told Ebony Hail to have [facility] call Dr Worthy Flank office.

## 2021-06-15 DIAGNOSIS — I110 Hypertensive heart disease with heart failure: Secondary | ICD-10-CM | POA: Diagnosis not present

## 2021-06-15 DIAGNOSIS — I2510 Atherosclerotic heart disease of native coronary artery without angina pectoris: Secondary | ICD-10-CM | POA: Diagnosis not present

## 2021-06-15 DIAGNOSIS — I471 Supraventricular tachycardia: Secondary | ICD-10-CM | POA: Diagnosis not present

## 2021-06-15 DIAGNOSIS — I480 Paroxysmal atrial fibrillation: Secondary | ICD-10-CM | POA: Diagnosis not present

## 2021-06-15 DIAGNOSIS — C3411 Malignant neoplasm of upper lobe, right bronchus or lung: Secondary | ICD-10-CM | POA: Diagnosis not present

## 2021-06-15 DIAGNOSIS — C7931 Secondary malignant neoplasm of brain: Secondary | ICD-10-CM | POA: Diagnosis not present

## 2021-06-15 DIAGNOSIS — I420 Dilated cardiomyopathy: Secondary | ICD-10-CM | POA: Diagnosis not present

## 2021-06-15 DIAGNOSIS — I5041 Acute combined systolic (congestive) and diastolic (congestive) heart failure: Secondary | ICD-10-CM | POA: Diagnosis not present

## 2021-06-15 DIAGNOSIS — D630 Anemia in neoplastic disease: Secondary | ICD-10-CM | POA: Diagnosis not present

## 2021-06-20 ENCOUNTER — Ambulatory Visit (INDEPENDENT_AMBULATORY_CARE_PROVIDER_SITE_OTHER): Payer: Medicare PPO

## 2021-06-20 DIAGNOSIS — I110 Hypertensive heart disease with heart failure: Secondary | ICD-10-CM | POA: Diagnosis not present

## 2021-06-20 DIAGNOSIS — C3411 Malignant neoplasm of upper lobe, right bronchus or lung: Secondary | ICD-10-CM

## 2021-06-20 DIAGNOSIS — D630 Anemia in neoplastic disease: Secondary | ICD-10-CM | POA: Diagnosis not present

## 2021-06-20 DIAGNOSIS — I471 Supraventricular tachycardia: Secondary | ICD-10-CM | POA: Diagnosis not present

## 2021-06-20 DIAGNOSIS — I480 Paroxysmal atrial fibrillation: Secondary | ICD-10-CM | POA: Diagnosis not present

## 2021-06-20 DIAGNOSIS — F03A0 Unspecified dementia, mild, without behavioral disturbance, psychotic disturbance, mood disturbance, and anxiety: Secondary | ICD-10-CM

## 2021-06-20 DIAGNOSIS — I5041 Acute combined systolic (congestive) and diastolic (congestive) heart failure: Secondary | ICD-10-CM | POA: Diagnosis not present

## 2021-06-20 DIAGNOSIS — C7931 Secondary malignant neoplasm of brain: Secondary | ICD-10-CM | POA: Diagnosis not present

## 2021-06-20 DIAGNOSIS — I420 Dilated cardiomyopathy: Secondary | ICD-10-CM | POA: Diagnosis not present

## 2021-06-20 DIAGNOSIS — I2510 Atherosclerotic heart disease of native coronary artery without angina pectoris: Secondary | ICD-10-CM | POA: Diagnosis not present

## 2021-06-20 NOTE — Chronic Care Management (AMB) (Signed)
Chronic Care Management    Social Work Note  06/20/2021 Name: Valerie [name] MRN: [id number] DOB: 04/26/1946  Valerie [name] is a 75 y.o. year old female who is a primary care patient of [name], MD. The CCM team was consulted to assist the patient with chronic disease management and/or care coordination needs related to:  Caregiver Assistance .   Engaged with patients daughter by phone  for  SW outreach  in response to provider referral for social work chronic care management and care coordination services.   Consent to Services:  The patient was given information about Chronic Care Management services, agreed to services, and gave verbal consent prior to initiation of services.  Please see initial visit note for detailed documentation.   Patient agreed to services and consent obtained.   SW placed a successful outbound call to the patients home to assist with caregiver resources. SW spoke with the patients daughter who reported Mr. Ostlund the patients spouse and primary caregiver passed away unexpectedly on 11-Jul-2023. Therefore the family declines SW involvement at this time stating they may place the patient in ALF. SW provided with contact information should the family need assistance.  Collaboration with patients primary care provider Dr. [facility] [place] to advise of patients spouse passing and plan for family to place patient.  Assessment: Review of patient past medical history, allergies, medications, and health status, including review of relevant consultants reports was performed today as part of a comprehensive evaluation and provision of chronic care management and care coordination services.     SDOH (Social Determinants of Health) assessments and interventions performed:    Advanced Directives Status: Not addressed in this encounter.  CCM Care Plan  Allergies  Allergen Reactions   Sulfa Antibiotics Other (See Comments)     She cant remember  what happens    Outpatient Encounter Medications as of 06/20/2021  Medication Sig   Ascorbic Acid (VITAMIN C) 100 MG tablet Take 100 mg by mouth every evening. Gummie   cetaphil (CETAPHIL) lotion Apply 1 application topically daily as needed for dry skin.   Cholecalciferol (D3 PO) Take 1 capsule by mouth daily.   donepezil (ARICEPT) 10 MG tablet TAKE 1 TABLET BY MOUTH ONCE DAILY IN THE EVENING (Patient taking differently: Take 10 mg by mouth at bedtime.)   erlotinib (TARCEVA) 100 MG tablet TAKE 1 TABLET (100 MG TOTAL) BY MOUTH DAILY. TAKE ON AN EMPTY STOMACH 1 HOUR BEFORE MEALS OR 2 HOURS AFTER   loperamide (IMODIUM A-D) 2 MG tablet Take 4 mg by mouth 3 (three) times daily as needed for diarrhea or loose stools.   MAGNESIUM PO Take 200 mg by mouth daily.   Multiple Vitamin (MULTI-VITAMINS) TABS Take 1 tablet by mouth every evening.    prochlorperazine (COMPAZINE) 10 MG tablet Take 1 tablet (10 mg total) by mouth every 6 (six) hours as needed for nausea or vomiting.   vitamin B-12 (CYANOCOBALAMIN) 1000 MCG tablet Take 1,000 mcg by mouth every evening.   No facility-administered encounter medications on file as of 06/20/2021.    Patient Active Problem List   Diagnosis Date Noted   Atherosclerosis of aorta ([place]) 93/81/8299   Complicated UTI (urinary tract infection) 04/17/2021   Malnutrition of moderate degree 06/13/2020   Rash and nonspecific skin eruption    Metastatic malignant neoplasm (HCC)    Failure to thrive in adult    Asymptomatic bacteriuria    Pressure injury of skin 06/03/2020  Cellulitis 06/03/2020   SVT (supraventricular tachycardia) ([place]) 03/26/2020   Normocytic anemia 03/24/2020   Urinary tract infection without hematuria    AMS (altered mental status) 03/23/2020   Hypokalemia 03/23/2020   Prolonged Q-T interval on ECG 03/23/2020   DNR (do not resuscitate) 03/23/2020   MRSA bacteremia 03/23/2020   Acute combined systolic and diastolic heart  failure (HCC)    Elevated troponin    Dilated cardiomyopathy ([name])    Coronary artery disease involving native coronary artery of native heart without angina pectoris    Congestive heart failure (CHF) ([place]) 02/14/2020   Pyelonephritis 09/10/2019   Acute blood loss anemia 06/12/2019   Closed fracture of distal end of right femur, initial encounter ([place]) 06/11/2019   Closed fracture of lateral portion of right tibial plateau 06/11/2019   History of DVT of lower extremity 06/11/2019   Blurry vision 01/06/2019   Headache 07/24/2016   Cough 08/24/2015   Chronic diarrhea 08/24/2015   Sepsis ([place])    Encounter for antineoplastic chemotherapy 01/31/2015   Urinary retention 10/20/2014   Ileus ([place]) 10/20/2014   Cellulitis of leg, right 10/20/2014   Fracture of right proximal fibula    Ankle fracture 10/02/2014   Ankle fracture 10/02/2014   Lung cancer ([place]) 10/02/2014   Closed fibular fracture 10/02/2014   Dementia ([place]) 10/02/2014   Lower urinary tract infectious disease 10/02/2014   Reactive airway disease 10/02/2014   Fall 10/02/2014   Closed left ankle fracture 10/01/2014   Right fibular fracture 10/01/2014   Lung metastasis ([place])    LESION, ANUS 04/26/2009   DIARRHEA 04/26/2009   Primary cancer of right upper lobe of lung ([place]) 04/22/2009   Malignant neoplasm of brain ([place]) 04/22/2009   VITAMIN D DEFICIENCY 04/22/2009   ANAL FISTULA 04/22/2009   OSTEOPOROSIS 04/22/2009    Conditions to be addressed/monitored: Dementia and Malignant Neoplasm of upper lobed of right lung ; Limited access to caregiver  There are no care plans that you recently modified to display for this patient.    Follow Up Plan:  No SW follow up planned at this time. SW provided contact number to patients family in case they need assistance in the future.      [name], BSW, CDP Social Worker, Certified Dementia Practitioner [place] [facility] [phone]

## 2021-06-21 ENCOUNTER — Other Ambulatory Visit (HOSPITAL_COMMUNITY): Payer: Self-pay

## 2021-06-22 DIAGNOSIS — I110 Hypertensive heart disease with heart failure: Secondary | ICD-10-CM | POA: Diagnosis not present

## 2021-06-22 DIAGNOSIS — I2510 Atherosclerotic heart disease of native coronary artery without angina pectoris: Secondary | ICD-10-CM | POA: Diagnosis not present

## 2021-06-22 DIAGNOSIS — I471 Supraventricular tachycardia: Secondary | ICD-10-CM | POA: Diagnosis not present

## 2021-06-22 DIAGNOSIS — C3411 Malignant neoplasm of upper lobe, right bronchus or lung: Secondary | ICD-10-CM | POA: Diagnosis not present

## 2021-06-22 DIAGNOSIS — I480 Paroxysmal atrial fibrillation: Secondary | ICD-10-CM | POA: Diagnosis not present

## 2021-06-22 DIAGNOSIS — C7931 Secondary malignant neoplasm of brain: Secondary | ICD-10-CM | POA: Diagnosis not present

## 2021-06-22 DIAGNOSIS — I5041 Acute combined systolic (congestive) and diastolic (congestive) heart failure: Secondary | ICD-10-CM | POA: Diagnosis not present

## 2021-06-22 DIAGNOSIS — I420 Dilated cardiomyopathy: Secondary | ICD-10-CM | POA: Diagnosis not present

## 2021-06-22 DIAGNOSIS — D630 Anemia in neoplastic disease: Secondary | ICD-10-CM | POA: Diagnosis not present

## 2021-06-27 ENCOUNTER — Other Ambulatory Visit (HOSPITAL_COMMUNITY): Payer: Self-pay

## 2021-06-28 DIAGNOSIS — I420 Dilated cardiomyopathy: Secondary | ICD-10-CM | POA: Diagnosis not present

## 2021-06-28 DIAGNOSIS — C7931 Secondary malignant neoplasm of brain: Secondary | ICD-10-CM | POA: Diagnosis not present

## 2021-06-28 DIAGNOSIS — D630 Anemia in neoplastic disease: Secondary | ICD-10-CM | POA: Diagnosis not present

## 2021-06-28 DIAGNOSIS — I480 Paroxysmal atrial fibrillation: Secondary | ICD-10-CM | POA: Diagnosis not present

## 2021-06-28 DIAGNOSIS — C3411 Malignant neoplasm of upper lobe, right bronchus or lung: Secondary | ICD-10-CM | POA: Diagnosis not present

## 2021-06-28 DIAGNOSIS — I471 Supraventricular tachycardia: Secondary | ICD-10-CM | POA: Diagnosis not present

## 2021-06-28 DIAGNOSIS — I2510 Atherosclerotic heart disease of native coronary artery without angina pectoris: Secondary | ICD-10-CM | POA: Diagnosis not present

## 2021-06-28 DIAGNOSIS — I110 Hypertensive heart disease with heart failure: Secondary | ICD-10-CM | POA: Diagnosis not present

## 2021-06-28 DIAGNOSIS — I5041 Acute combined systolic (congestive) and diastolic (congestive) heart failure: Secondary | ICD-10-CM | POA: Diagnosis not present

## 2021-06-29 ENCOUNTER — Encounter: Payer: Self-pay | Admitting: Internal Medicine

## 2021-06-29 DIAGNOSIS — I110 Hypertensive heart disease with heart failure: Secondary | ICD-10-CM | POA: Diagnosis not present

## 2021-06-29 DIAGNOSIS — C3411 Malignant neoplasm of upper lobe, right bronchus or lung: Secondary | ICD-10-CM | POA: Diagnosis not present

## 2021-06-29 DIAGNOSIS — D630 Anemia in neoplastic disease: Secondary | ICD-10-CM | POA: Diagnosis not present

## 2021-06-29 DIAGNOSIS — N390 Urinary tract infection, site not specified: Secondary | ICD-10-CM | POA: Diagnosis not present

## 2021-06-29 DIAGNOSIS — I5041 Acute combined systolic (congestive) and diastolic (congestive) heart failure: Secondary | ICD-10-CM | POA: Diagnosis not present

## 2021-06-29 DIAGNOSIS — I420 Dilated cardiomyopathy: Secondary | ICD-10-CM | POA: Diagnosis not present

## 2021-06-29 DIAGNOSIS — I471 Supraventricular tachycardia: Secondary | ICD-10-CM | POA: Diagnosis not present

## 2021-06-29 DIAGNOSIS — C7931 Secondary malignant neoplasm of brain: Secondary | ICD-10-CM | POA: Diagnosis not present

## 2021-06-29 DIAGNOSIS — I480 Paroxysmal atrial fibrillation: Secondary | ICD-10-CM | POA: Diagnosis not present

## 2021-06-29 DIAGNOSIS — I2510 Atherosclerotic heart disease of native coronary artery without angina pectoris: Secondary | ICD-10-CM | POA: Diagnosis not present

## 2021-07-05 ENCOUNTER — Other Ambulatory Visit: Payer: Self-pay

## 2021-07-05 DIAGNOSIS — N390 Urinary tract infection, site not specified: Secondary | ICD-10-CM

## 2021-07-05 MED ORDER — CEPHALEXIN 500 MG PO CAPS: 500.0000 mg | ORAL_CAPSULE | Freq: Three times a day (TID) | ORAL | 0 refills | Status: DC

## 2021-07-05 MED ORDER — CEPHALEXIN 500 MG PO CAPS: 500.0000 mg | ORAL_CAPSULE | Freq: Three times a day (TID) | ORAL | 0 refills | Status: AC

## 2021-07-07 ENCOUNTER — Ambulatory Visit (HOSPITAL_BASED_OUTPATIENT_CLINIC_OR_DEPARTMENT_OTHER): Payer: Medicare PPO | Admitting: Cardiology

## 2021-07-07 ENCOUNTER — Encounter (HOSPITAL_BASED_OUTPATIENT_CLINIC_OR_DEPARTMENT_OTHER): Payer: Self-pay | Admitting: Cardiology

## 2021-07-07 ENCOUNTER — Other Ambulatory Visit: Payer: Self-pay

## 2021-07-07 VITALS — BP 110/61 | HR 93 | Ht 72.0 in

## 2021-07-07 DIAGNOSIS — I471 Supraventricular tachycardia: Secondary | ICD-10-CM | POA: Diagnosis not present

## 2021-07-07 DIAGNOSIS — C3411 Malignant neoplasm of upper lobe, right bronchus or lung: Secondary | ICD-10-CM | POA: Diagnosis not present

## 2021-07-07 DIAGNOSIS — D649 Anemia, unspecified: Secondary | ICD-10-CM

## 2021-07-07 DIAGNOSIS — I480 Paroxysmal atrial fibrillation: Secondary | ICD-10-CM | POA: Diagnosis not present

## 2021-07-07 DIAGNOSIS — Z87898 Personal history of other specified conditions: Secondary | ICD-10-CM | POA: Insufficient documentation

## 2021-07-07 DIAGNOSIS — I5041 Acute combined systolic (congestive) and diastolic (congestive) heart failure: Secondary | ICD-10-CM | POA: Diagnosis not present

## 2021-07-07 DIAGNOSIS — I2510 Atherosclerotic heart disease of native coronary artery without angina pectoris: Secondary | ICD-10-CM | POA: Diagnosis not present

## 2021-07-07 DIAGNOSIS — D630 Anemia in neoplastic disease: Secondary | ICD-10-CM | POA: Diagnosis not present

## 2021-07-07 DIAGNOSIS — I420 Dilated cardiomyopathy: Secondary | ICD-10-CM

## 2021-07-07 DIAGNOSIS — C7931 Secondary malignant neoplasm of brain: Secondary | ICD-10-CM | POA: Diagnosis not present

## 2021-07-07 DIAGNOSIS — I110 Hypertensive heart disease with heart failure: Secondary | ICD-10-CM | POA: Diagnosis not present

## 2021-07-07 NOTE — Patient Instructions (Signed)
Medication Instructions:  Your Physician recommend you continue on your current medication as directed.    *If you need a refill on your cardiac medications before your next appointment, please call your pharmacy*   Lab Work: None ordered today   Testing/Procedures: None ordered today   Follow-Up: At [facility], you and your health needs are our priority.  As part of our continuing mission to provide you with exceptional heart care, we have created designated Provider Care Teams.  These Care Teams include your primary Cardiologist (physician) and Advanced Practice Providers (APPs -  Physician Assistants and Nurse Practitioners) who all work together to provide you with the care you need, when you need it.  We recommend signing up for the patient portal called "MyChart".  Sign up information is provided on this After Visit Summary.  MyChart is used to connect with patients for Virtual Visits (Telemedicine).  Patients are able to view lab/test results, encounter notes, upcoming appointments, etc.  Non-urgent messages can be sent to your provider as well.   To learn more about what you can do with MyChart, go to [email or web address].    Your next appointment:   3 month(s)  The format for your next appointment:   In Person  Provider:   Bridgette Christopher, MD{         

## 2021-07-07 NOTE — Progress Notes (Signed)
Cardiology Office Note:    Date:  07/07/2021   ID:  Valerie [name], DOB 02/01/1946, MRN [id number]  PCP:  Valerie Chard, MD  Cardiologist:  Valerie Dresser, MD  Referring MD: Valerie Chard, MD   Chief Complaint:  Follow up  History of Present Illness:    Valerie [name] is a 75 y.o. female with a PMH of nonischemic cardiomyopathy, nonobstructive CAD, paroxysmal atrial fibrillation, metastatic NSCLC (diagnosed 108 yr ago, on oral chemo x10 years), history of DVT on anticoagulation, limited mobility since 05/2019 following a LLE fracture, who is seen for follow up  Today: She is accompanied by a family member.  Physically she is feeling okay, but often fatigued. Unfortunately, her husband passed away last month. She is pending a move into assisted living. Offered my condolences and support in this difficult time.  At times she has nervous feelings, but denies any palpitations. She reports one isolated episode of a quick chest discomfort lasting a second. Per her family member she becomes short of breath easily.  At baseline she typically feels cold.  In 03/2021 she was admitted to the hospital with cystitis. Since then, she denies having any hematuria.  She denies any lightheadedness, headaches, syncope, orthopnea, PND, or exertional symptoms.   Past Medical History:  Diagnosis Date   Abnormal Pap smear 2006   Anal fistula    Ankle fracture    Anxiety    Arthritis    Atrophic vaginitis 2008   Cataract    Dementia ([place]) 2009   Dyspareunia 2008   H/O osteoporosis    H/O varicella    H/O vitamin D deficiency    Headache 07/24/2016   Heart murmur    History of measles, mumps, or rubella    History of radiation therapy 07/28/13- 08/10/13   right lung metastasis 5000 cGy 10 sessions   Hypertension    Lung cancer ([place]) dx'd 2002   Lung cancer ([place])    Lung cancer ([place])    Lung metastasis ([place])    PET scan 05/05/13, RUL lung nodule   Metastasis to brain  [facility]) dx'd 2008   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   Nodule of right lung CT- 06/03/12   RIGHT UPPER LOBE   Nodule of right lung 06/03/12   Upper Lobe   On antineoplastic chemotherapy    TARCEVA   Osteoporosis 2010   Primary cancer of right upper lobe of lung ([place]) 04/22/2009   Qualifier: Diagnosis of  By: Nils Pyle CMA (AAMA), Leisha     Shortness of breath    hx lung ca    Status post chemotherapy 2003   CARBOPLATIN/PACLITAXEL /STATUS POST CLINICAL TRAIL OF CELEBREX AND IRESSA AT [facility] FOR 1 YEAR   Status post radiation therapy 2003   LEFT LUNG   Status post radiation therapy 11/07/2005   WHOLE BRAIN: DR Larkin [facility]   Status post radiation therapy 06/02/2008   GAMMA KNIFE OF RESECTED CAVITAY   Thyroid adenoma    ?   Thyroid cancer ([place]) 10/18/11 bx   adenoid nodules    Yeast infection    Past Surgical History:  Procedure Laterality Date   BRAIN SURGERY     INTRAVASCULAR ULTRASOUND/IVUS N/A 02/16/2020   Procedure: Intravascular Ultrasound/IVUS;  Surgeon: Valerie Man, MD;  Location: [place] [facility];  Service: Cardiovascular;  Laterality: N/A;   LEFT LOWER LOBECTOMY  05/2001   LEFT OCCIPITAL CRANIOTOMY  09/21/2005   tumor   LUNG LOBECTOMY  NECK SURGERY     ORIF ANKLE FRACTURE Left 10/02/2014   Procedure: OPEN REDUCTION INTERNAL FIXATION (ORIF) ANKLE FRACTURE;  Surgeon: Valerie Corning, MD;  Location: [facility];  Service: Orthopedics;  Laterality: Left;   ORIF FEMUR FRACTURE Right 06/11/2019   Procedure: OPEN REDUCTION INTERNAL FIXATION (ORIF) DISTAL FEMUR FRACTURE;  Surgeon: Valerie Butters, MD;  Location: [place];  Service: Orthopedics;  Laterality: Right;   ORIF TIBIA PLATEAU Right 06/11/2019   Procedure: Open Reduction Internal Fixation (Orif) Tibial Plateau;  Surgeon: Valerie Butters, MD;  Location: [place];  Service: Orthopedics;  Laterality: Right;   RADICAL NECK DISSECTION  10/18/2011   Procedure: RADICAL NECK DISSECTION;  Surgeon: Valerie Gala, MD;  Location: [place];  Service: ENT;  Laterality: Right;  RIGHT MODIFIED NECK DISSECTION /POSSIBLE RIGHT THYROIDECTOM   RIGHT/LEFT HEART CATH AND CORONARY ANGIOGRAPHY N/A 02/16/2020   Procedure: RIGHT/LEFT HEART CATH AND CORONARY ANGIOGRAPHY;  Surgeon: Valerie Man, MD;  [facility]: [name];  Service: Cardiovascular;  Laterality: N/A;   TEE WITHOUT CARDIOVERSION N/A 03/28/2020   Procedure: TRANSESOPHAGEAL ECHOCARDIOGRAM (TEE);  Surgeon: Valerie Dresser, MD;  Location: [facility];  Service: Cardiovascular;  Laterality: N/A;   THYROIDECTOMY  10/18/2011   Procedure: THYROIDECTOMY WITH RADICAL NECK DISSECTION;  Surgeon: Valerie Gala, MD;  Location: [address];  Service: ENT;  Laterality: Right;     Current Meds  Medication Sig   Ascorbic Acid (VITAMIN C) 100 MG tablet Take 100 mg by mouth every evening. Gummie   cephALEXin (KEFLEX) 500 MG capsule Take 1 capsule (500 mg total) by mouth 3 (three) times daily for 7 days.   cetaphil (CETAPHIL) lotion Apply 1 application topically daily as needed for dry skin.   Cholecalciferol (D3 PO) Take 1 capsule by mouth daily.   donepezil (ARICEPT) 10 MG tablet TAKE 1 TABLET BY MOUTH ONCE DAILY IN THE EVENING (Patient taking differently: Take 10 mg by mouth at bedtime.)   erlotinib (TARCEVA) 100 MG tablet TAKE 1 TABLET (100 MG TOTAL) BY MOUTH DAILY. TAKE ON AN EMPTY STOMACH 1 HOUR BEFORE A MEAL OR 2 HOURS AFTER.   loperamide (IMODIUM A-D) 2 MG tablet Take 4 mg by mouth 3 (three) times daily as needed for diarrhea or loose stools.   MAGNESIUM PO Take 200 mg by mouth daily.   Multiple Vitamin (MULTI-VITAMINS) TABS Take 1 tablet by mouth every evening.    prochlorperazine (COMPAZINE) 10 MG tablet Take 1 tablet (10 mg total) by mouth every 6 (six) hours as needed for nausea or vomiting.   vitamin B-12 (CYANOCOBALAMIN) 1000 MCG tablet Take 1,000 mcg by mouth every evening.     Allergies:   Sulfa antibiotics   Social History   Tobacco Use   Smoking status: Never    Smokeless tobacco: Never   Tobacco comments:    smoked few years in college  Vaping Use   Vaping Use: Never used  Substance Use Topics   Alcohol use: No   Drug use: No     Family Hx: The patient's family history includes Cancer in her father, maternal aunt, and mother; Gait disorder in her mother; Heart failure in her sister.  ROS:   Please see the history of present illness.    (+) Stress/Grief (+) Shortness of breath All other systems reviewed and are negative.   Prior CV studies:   The following studies were reviewed today:  CT Chest 02/23/2021: IMPRESSION: 1. Stable appearance of the left hemithorax status post left  lower lobectomy and external beam radiation. Persistent small left pleural effusion. 2. Again seen is chronic right upper lobe peribronchovascular increased soft tissue. This appears slightly increased in size in the interval as measured off the coronal images. 3. Similar appearance of bilateral hydronephrosis and hydroureter. 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 Hydroureter extends to the level of the urinary bladder where there is signs chronic bladder outlet obstruction. Previously noted bladder wall thickening and mucosal enhancement has improved in the interval. 4. Aortic atherosclerosis. 5. Similar appearance of T10, L1, L4, and L5 compression deformities.  LE Venous DVT 06/12/2020: Summary:  RIGHT:  - There is no evidence of deep vein thrombosis in the lower extremity.     - No cystic structure found in the popliteal fossa.     LEFT:  - There is no evidence of deep vein thrombosis in the lower extremity.     - No cystic structure found in the popliteal fossa.   TEE 03/28/20 1. Left ventricular ejection fraction, by estimation, is 25 to 30%. The  left ventricle has severely decreased function. The left ventricle  demonstrates global hypokinesis.   2. Right ventricular systolic function is normal. The right ventricular   size is normal. There is mildly elevated pulmonary artery systolic  pressure.   3. Left atrial size was mild to moderately dilated. No left atrial/left  atrial appendage thrombus was detected.   4. The mitral valve is normal in structure. Trivial mitral valve  regurgitation.   5. The aortic valve is tricuspid. Aortic valve regurgitation is mild.  Mild aortic valve sclerosis is present, with no evidence of aortic valve  stenosis.   6. There is mild (Grade II) plaque involving the descending aorta.   7. Evidence of atrial level shunting detected by color flow Doppler.  There is a small patent foramen ovale with predominantly left to right  shunting across the atrial septum.   Conclusion(s)/Recommendation(s): No evidence of vegetation/infective  endocarditis on this transesophageal echocardiogram.   Echo 03/24/20 1. Left ventricular ejection fraction, by estimation, is 25 to 30%. The  left ventricle has severely decreased function. The left ventricle  demonstrates global hypokinesis. Left ventricular diastolic parameters are  consistent with Grade I diastolic  dysfunction (impaired relaxation).   2. Right ventricular systolic function is normal. The right ventricular  size is normal. Tricuspid regurgitation signal is inadequate for assessing  PA pressure.   3. Left atrial size was mildly dilated.   4. The mitral valve is normal in structure. Mild mitral valve  regurgitation. No evidence of mitral stenosis.   5. The aortic valve is tricuspid. Aortic valve regurgitation is mild.  Mild aortic valve sclerosis is present, with no evidence of aortic valve  stenosis.   6. The inferior vena cava is normal in size with greater than 50%  respiratory variability, suggesting right atrial pressure of 3 mmHg.   Echocardiogram 02/15/20: 1. Left ventricular ejection fraction, by estimation, is 20 to 25%. The  left ventricle has severely decreased function. The left ventricle  demonstrates global  hypokinesis. Left ventricular diastolic parameters are  consistent with Grade I diastolic  dysfunction (impaired relaxation).   2. Right ventricular systolic function is normal. The right ventricular  size is normal. There is mildly elevated pulmonary artery systolic  pressure.   3. The mitral valve is abnormal. Mild to moderate mitral valve  regurgitation.   4. The aortic valve is grossly normal. Aortic valve regurgitation is  mild.   5.  The inferior vena cava is dilated in size with >50% respiratory  variability, suggesting right atrial pressure of 8 mmHg.    Right/left heart catheterization 02/16/20: Hemodynamic findings consistent with mild (borderline) pulmonary hypertension. LV end diastolic pressure is normal. Ost Cx to Prox Cx lesion is 55% stenosed. -> Somewhat ulcerated mixed plaque on IVUS-estimated 50-60%, not flow-limiting by FFR (0.98), Ost LAD to Prox LAD lesion is 30% stenosed. Prox RCA lesion is 20% stenosed. Tortuous coronary arteries   SUMMARY Most likely NONISCHEMIC CARDIOMYOPATHY -> well compensated/adequately diuresed Moderate single-vessel CAD involving ostial and proximal LCx subacute plaque rupture-DFR and FFR negative (55 to 60% stenosis) Otherwise minimal CAD with proximal LAD and RCA 20 to 30% stenosis. Right heart cath pressures show PCWP 18 mmHg with V wave of 24 mmHg from MR, LVEDP 14 mmHg.  PA diastolic 26 mm; Cardiac Output-Index: 6.88-3.8  Labs/Other Tests and Data Reviewed:    EKG:  EKG is personally reviewed. 07/07/2021: not performed 06/11/2020: Sinus tachycardia, LBBB  Recent Labs: 05/17/2021: ALT 8; BUN 18; Creatinine 0.90; Hemoglobin 11.8; Platelet Count 324; Potassium 3.9; Sodium 142   Recent Lipid Panel Lab Results  Component Value Date/Time   CHOL 189 04/20/2020 12:00 AM   CHOL 263 (H) 10/16/2018 01:00 PM   TRIG 130 04/20/2020 12:00 AM   HDL 69 04/20/2020 12:00 AM   HDL 90 10/16/2018 01:00 PM   CHOLHDL 3.6 02/15/2020 05:50 AM    LDLCALC 97 04/20/2020 12:00 AM   LDLCALC 143 (H) 10/16/2018 01:00 PM    Wt Readings from Last 3 Encounters:  04/22/21 155 lb 6.8 oz (70.5 kg)  09/14/20 160 lb (72.6 kg)  08/24/20 158 lb (71.7 kg)     Objective:    Vital Signs:  BP 110/61   Pulse 93   Ht 6' (1.829 m)   SpO2 98%   BMI 21.08 kg/m    GEN: Frail appearing, in wheelchair, in no acute distress HEENT: Normal, moist mucous membranes NECK: No JVD CARDIAC: regular rhythm, normal S1 and S2, no rubs or gallops. 2/6 systolic murmur. VASCULAR: Radial and DP pulses 2+ bilaterally. No carotid bruits RESPIRATORY:  Clear to auscultation without rales, wheezing or rhonchi  ABDOMEN: Soft, non-tender, non-distended MUSCULOSKELETAL:  Limited but moves all 4 limbs independently SKIN: Warm and dry, no edema NEUROLOGIC:  Alert and oriented x 3. PSYCHIATRIC:  Normal affect   ASSESSMENT & PLAN:    Nonischemic cardiomyopathy -unable to use ACEi/ARB/ARNI/MRA due to hypotension -metoprolol held during recent admission due to hypotension -euvolemic on exam. -see prior discussion re: repeat echo and icd. Have declined in the past -limited options given her chronic hypotension  Nonobstructive CAD: -now off of clopidogrel. Was on rivaroxaban, now stopped due to hematuria  -has chronic anemia -with nonobstructive disease, high risk of bleeding, will hold on aspirin -continue atorvastatin 80 mg, though not on her current med list or prior discharge med list. Per care everywhere, appears still active. Recommend reviewing meds when she gets home  Paroxysmal atrial fibrillation -regular on exam today -CHA2DS2/VAS Stroke Risk Points=8 -unfortunately she was admitted 04/2021 with hematuria. Improved on holding DOAC, restarted when rivaroxaban restarted -she understands risk of stroke vs risk of bleeding. After shared decision making, will hold on retrialing DOAC per patient preference.   Follow up: 3 mos or sooner as needed  Medication  Adjustments/Labs and Tests Ordered: Current medicines are reviewed at length with the patient today.  Concerns regarding medicines are outlined above.   Tests Ordered: No orders  of the defined types were placed in this encounter.  Medication Changes: No orders of the defined types were placed in this encounter.  Patient Instructions  Medication Instructions:  Your Physician recommend you continue on your current medication as directed.    *If you need a refill on your cardiac medications before your next appointment, please call your pharmacy*   Lab Work: None ordered today   Testing/Procedures: None ordered today   Follow-Up: At [facility], you and your health needs are our priority.  As part of our continuing mission to provide you with exceptional heart care, we have created designated Provider Care Teams.  These Care Teams include your primary Cardiologist (physician) and Advanced Practice Providers (APPs -  Physician Assistants and Nurse Practitioners) who all work together to provide you with the care you need, when you need it.  We recommend signing up for the patient portal called "MyChart".  Sign up information is provided on this After Visit Summary.  MyChart is used to connect with patients for Virtual Visits (Telemedicine).  Patients are able to view lab/test results, encounter notes, upcoming appointments, etc.  Non-urgent messages can be sent to your provider as well.   To learn more about what you can do with MyChart, go to [email or web address].    Your next appointment:   3 month(s)  The format for your next appointment:   In Person  Provider:   Buford Dresser, MD   [facility] Stumpf,acting as a scribe for Valerie Dresser, MD.,have documented all relevant documentation on the behalf of Valerie Dresser, MD,as directed by  Valerie Dresser, MD while in the presence of Valerie Dresser, MD.  I, Valerie Dresser, MD, have  reviewed all documentation for this visit. The documentation on 07/07/21 for the exam, diagnosis, procedures, and orders are all accurate and complete.   Signed, Valerie Dresser, MD  07/07/2021  [facility]

## 2021-07-10 DIAGNOSIS — Z111 Encounter for screening for respiratory tuberculosis: Secondary | ICD-10-CM | POA: Diagnosis not present

## 2021-07-11 DIAGNOSIS — D630 Anemia in neoplastic disease: Secondary | ICD-10-CM | POA: Diagnosis not present

## 2021-07-11 DIAGNOSIS — I5041 Acute combined systolic (congestive) and diastolic (congestive) heart failure: Secondary | ICD-10-CM | POA: Diagnosis not present

## 2021-07-11 DIAGNOSIS — I471 Supraventricular tachycardia: Secondary | ICD-10-CM | POA: Diagnosis not present

## 2021-07-11 DIAGNOSIS — I420 Dilated cardiomyopathy: Secondary | ICD-10-CM | POA: Diagnosis not present

## 2021-07-11 DIAGNOSIS — I110 Hypertensive heart disease with heart failure: Secondary | ICD-10-CM | POA: Diagnosis not present

## 2021-07-11 DIAGNOSIS — I480 Paroxysmal atrial fibrillation: Secondary | ICD-10-CM | POA: Diagnosis not present

## 2021-07-11 DIAGNOSIS — C3411 Malignant neoplasm of upper lobe, right bronchus or lung: Secondary | ICD-10-CM | POA: Diagnosis not present

## 2021-07-11 DIAGNOSIS — C7931 Secondary malignant neoplasm of brain: Secondary | ICD-10-CM | POA: Diagnosis not present

## 2021-07-11 DIAGNOSIS — I2510 Atherosclerotic heart disease of native coronary artery without angina pectoris: Secondary | ICD-10-CM | POA: Diagnosis not present

## 2021-07-12 DIAGNOSIS — Z111 Encounter for screening for respiratory tuberculosis: Secondary | ICD-10-CM | POA: Diagnosis not present

## 2021-07-17 ENCOUNTER — Encounter: Payer: Self-pay | Admitting: Internal Medicine

## 2021-07-18 ENCOUNTER — Encounter: Payer: Self-pay | Admitting: Nurse Practitioner

## 2021-07-18 ENCOUNTER — Other Ambulatory Visit: Payer: Self-pay

## 2021-07-18 ENCOUNTER — Ambulatory Visit: Payer: Medicare PPO | Admitting: Nurse Practitioner

## 2021-07-18 VITALS — BP 110/78 | HR 103 | Temp 98.1°F | Ht 68.0 in

## 2021-07-18 DIAGNOSIS — I700 Atherosclerosis of aorta: Secondary | ICD-10-CM

## 2021-07-18 DIAGNOSIS — R5381 Other malaise: Secondary | ICD-10-CM

## 2021-07-18 DIAGNOSIS — Z9989 Dependence on other enabling machines and devices: Secondary | ICD-10-CM

## 2021-07-18 DIAGNOSIS — I480 Paroxysmal atrial fibrillation: Secondary | ICD-10-CM | POA: Diagnosis not present

## 2021-07-18 DIAGNOSIS — Z23 Encounter for immunization: Secondary | ICD-10-CM | POA: Diagnosis not present

## 2021-07-18 DIAGNOSIS — I5041 Acute combined systolic (congestive) and diastolic (congestive) heart failure: Secondary | ICD-10-CM | POA: Diagnosis not present

## 2021-07-18 DIAGNOSIS — R627 Adult failure to thrive: Secondary | ICD-10-CM | POA: Diagnosis not present

## 2021-07-18 DIAGNOSIS — C3411 Malignant neoplasm of upper lobe, right bronchus or lung: Secondary | ICD-10-CM

## 2021-07-18 DIAGNOSIS — I471 Supraventricular tachycardia: Secondary | ICD-10-CM | POA: Diagnosis not present

## 2021-07-18 DIAGNOSIS — I420 Dilated cardiomyopathy: Secondary | ICD-10-CM | POA: Diagnosis not present

## 2021-07-18 DIAGNOSIS — D630 Anemia in neoplastic disease: Secondary | ICD-10-CM | POA: Diagnosis not present

## 2021-07-18 DIAGNOSIS — I110 Hypertensive heart disease with heart failure: Secondary | ICD-10-CM | POA: Diagnosis not present

## 2021-07-18 DIAGNOSIS — F03A0 Unspecified dementia, mild, without behavioral disturbance, psychotic disturbance, mood disturbance, and anxiety: Secondary | ICD-10-CM | POA: Diagnosis not present

## 2021-07-18 DIAGNOSIS — R269 Unspecified abnormalities of gait and mobility: Secondary | ICD-10-CM | POA: Diagnosis not present

## 2021-07-18 DIAGNOSIS — C7931 Secondary malignant neoplasm of brain: Secondary | ICD-10-CM | POA: Diagnosis not present

## 2021-07-18 DIAGNOSIS — I2510 Atherosclerotic heart disease of native coronary artery without angina pectoris: Secondary | ICD-10-CM | POA: Diagnosis not present

## 2021-07-18 NOTE — Progress Notes (Addendum)
I,Victoria T Hamilton,acting as a Education administrator for [employer], NP.,have documented all relevant documentation on the behalf of [employer], NP,as directed by  Bary Castilla, NP while in the presence of Bary Castilla, NP.  This visit occurred during the SARS-CoV-2 public health emergency.  Safety protocols were in place, including screening questions prior to the visit, additional usage of staff PPE, and extensive cleaning of exam room while observing appropriate contact time as indicated for disinfecting solutions.  Subjective:     Patient ID: Valerie [name] , female    DOB: [birth year] , 75 y.o.   MRN: [id number]   No chief complaint on file.   HPI  She is here for assessment for nursing home [facility]). Her husband recently passed away. She is here with her daughter and her sis-in law. She is mostly wheelchair bound due to weakness and hip pain   Diet: regular but avoid fiber, avoid fruits and broccoli due to GI distress and cancer medication (Tarceva)  She receives PT at home.  She eats well and her appetite is good. She stays pretty well hydrated.  She is seen by the cardiologist x every 3 months.  She sees the oncologist every 6 months. She is taking the medication for her lung cancer.  She swallows her pills well with glass of water.  She is mostly in the wheelchair at home and moving around. She does and will need PT and OT. She already has the DNR and MOST form as per her daughter.  She does not drink alcohol.     Past Medical History:  Diagnosis Date   Abnormal Pap smear 2006   Anal fistula    Ankle fracture    Anxiety    Arthritis    Atrophic vaginitis 2008   Cataract    Dementia ([place]) 2009   Dyspareunia 2008   H/O osteoporosis    H/O varicella    H/O vitamin D deficiency    Headache 07/24/2016   Heart murmur    History of measles, mumps, or rubella    History of radiation therapy 07/28/13- 08/10/13   right lung metastasis 5000 cGy 10 sessions    Hypertension    Lung cancer ([place]) dx'd 2002   Lung cancer ([place])    Lung cancer ([place])    Lung metastasis ([place])    PET scan 05/05/13, RUL lung nodule   Metastasis to brain [facility]) dx'd 2008   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 09/2011   Nodule of right lung CT- 06/03/12   RIGHT UPPER LOBE   Nodule of right lung 06/03/12   Upper Lobe   On antineoplastic chemotherapy    TARCEVA   Osteoporosis 2010   Primary cancer of right upper lobe of lung ([place]) 04/22/2009   Qualifier: Diagnosis of  By: Nils Pyle CMA (AAMA), Leisha     Shortness of breath    hx lung ca    Status post chemotherapy 2003   CARBOPLATIN/PACLITAXEL /STATUS POST CLINICAL TRAIL OF CELEBREX AND IRESSA AT [facility] FOR 1 YEAR   Status post radiation therapy 2003   LEFT LUNG   Status post radiation therapy 11/07/2005   WHOLE BRAIN: DR Larkin [facility]   Status post radiation therapy 06/02/2008   GAMMA KNIFE OF RESECTED CAVITAY   Thyroid adenoma    ?   Thyroid cancer ([place]) 10/18/11 bx   adenoid nodules    Yeast infection      Family History  Problem Relation Age of Onset   Gait disorder  Mother    Cancer Mother        Meningioma   Cancer Father        Pancreatic   Heart failure Sister    Cancer Maternal Aunt        menigeoma     Current Outpatient Medications:    Ascorbic Acid (VITAMIN C) 100 MG tablet, Take 100 mg by mouth every evening. Gummie, Disp: , Rfl:    cetaphil (CETAPHIL) lotion, Apply 1 application topically daily as needed for dry skin., Disp: , Rfl:    Cholecalciferol (D3 PO), Take 1 capsule by mouth daily., Disp: , Rfl:    donepezil (ARICEPT) 10 MG tablet, TAKE 1 TABLET BY MOUTH ONCE DAILY IN THE EVENING (Patient taking differently: Take 10 mg by mouth at bedtime.), Disp: 90 tablet, Rfl: 0   erlotinib (TARCEVA) 100 MG tablet, TAKE 1 TABLET (100 MG TOTAL) BY MOUTH DAILY. TAKE ON AN EMPTY STOMACH 1 HOUR BEFORE A MEAL OR 2 HOURS AFTER., Disp: 30 tablet, Rfl: 2   loperamide (IMODIUM A-D) 2 MG tablet, Take 4 mg by  mouth 3 (three) times daily as needed for diarrhea or loose stools., Disp: , Rfl:    Multiple Vitamin (MULTI-VITAMINS) TABS, Take 1 tablet by mouth every evening. , Disp: , Rfl:    prochlorperazine (COMPAZINE) 10 MG tablet, Take 1 tablet (10 mg total) by mouth every 6 (six) hours as needed for nausea or vomiting., Disp: 30 tablet, Rfl: 0   vitamin B-12 (CYANOCOBALAMIN) 1000 MCG tablet, Take 1,000 mcg by mouth every evening., Disp: , Rfl:    MAGNESIUM PO, Take 200 mg by mouth daily. (Patient not taking: Reported on 07/18/2021), Disp: , Rfl:    Allergies  Allergen Reactions   Sulfa Antibiotics Other (See Comments)    She cant remember  what happens     Review of Systems  Constitutional:  Negative for chills.  HENT:  Negative for congestion, rhinorrhea, sinus pain and sneezing.   Respiratory:  Negative for cough, shortness of breath and wheezing.   Cardiovascular:  Negative for chest pain and palpitations.  Musculoskeletal:  Positive for arthralgias and myalgias.       Hip pain   Neurological:  Positive for weakness. Negative for headaches.    Today's Vitals   07/18/21 1220  BP: 110/78  Pulse: (!) 103  Temp: 98.1 F (36.7 C)  Height: $Remove'5\' 8"'VnTCFAe$  (1.727 m)   Body mass index is 23.63 kg/m.  Wt Readings from Last 3 Encounters:  04/22/21 155 lb 6.8 oz (70.5 kg)  09/14/20 160 lb (72.6 kg)  08/24/20 158 lb (71.7 kg)     Objective:  Physical Exam Constitutional:      Appearance: Normal appearance.  HENT:     Head: Normocephalic and atraumatic.  Cardiovascular:     Rate and Rhythm: Normal rate and regular rhythm.     Pulses: Normal pulses.     Heart sounds: Normal heart sounds. No murmur heard. Pulmonary:     Effort: Pulmonary effort is normal. No respiratory distress.     Breath sounds: Normal breath sounds. No wheezing.  Skin:    General: Skin is warm and dry.     Capillary Refill: Capillary refill takes less than 2 seconds.  Neurological:     Mental Status: She is alert.         Assessment And Plan:     1. Gait abnormality -The patient is in a wheelchair to be assessed for nursing home facility.  -[facility]  PT at home; PT/OT requested at nursing facility.   2. Malignant neoplasm of upper lobe of right lung [facility]) -Currently seen by oncologist.  -She is taking Tarceva. Advised to avoid certain foods such as broccoli and fruits by the oncologist.  - CMP14+EGFR - CBC with Diff  3. Debility -Assessment for nursing home facility. Sent order to receive PT/OT   4. Atherosclerosis of aorta (HCC) - CMP14+EGFR - CBC with Diff - Lipid panel  5. Mild dementia without behavioral disturbance, psychotic disturbance, mood disturbance, or anxiety, unspecified dementia type - CMP14+EGFR -Currently on Aricept.   6. Immunization due - Flu Vaccine QUAD High Dose(Fluad) - Pneumococcal polysaccharide vaccine 23-valent greater than or equal to 2yo subcutaneous/IM   -The patient was assessed for nursing home facility. Forms were filled out by me and approved by Dr. Baird Cancer.   The patient was encouraged to call or send a message through [place] for any questions or concerns.   Follow up: if symptoms persist or do not get better.   Side effects and appropriate use of all the medication(s) were discussed with the patient today. Patient advised to use the medication(s) as directed by their healthcare provider. The patient was encouraged to read, review, and understand all associated package inserts and contact our office with any questions or concerns. The patient accepts the risks of the treatment plan and had an opportunity to ask questions.   Patient was given opportunity to ask questions. Patient verbalized understanding of the plan and was able to repeat key elements of the plan. All questions were answered to their satisfaction.  Raman Kristy Catoe, DNP   I, Raman Silas Muff have reviewed all documentation for this visit. The documentation on 07/18/21 for the exam, diagnosis,  procedures, and orders are all accurate and complete.   IF YOU HAVE BEEN REFERRED TO A SPECIALIST, IT MAY TAKE 1-2 WEEKS TO SCHEDULE/PROCESS THE REFERRAL. IF YOU HAVE NOT HEARD FROM US/SPECIALIST IN TWO WEEKS, PLEASE GIVE [place] A CALL AT [phone] X [phone].   THE PATIENT IS ENCOURAGED TO PRACTICE SOCIAL DISTANCING DUE TO THE COVID-19 PANDEMIC.

## 2021-07-19 DIAGNOSIS — F03A0 Unspecified dementia, mild, without behavioral disturbance, psychotic disturbance, mood disturbance, and anxiety: Secondary | ICD-10-CM

## 2021-07-19 DIAGNOSIS — C3411 Malignant neoplasm of upper lobe, right bronchus or lung: Secondary | ICD-10-CM

## 2021-07-19 LAB — CMP14+EGFR
ALT: 9 IU/L (ref 0–32)
AST: 12 IU/L (ref 0–40)
Albumin/Globulin Ratio: 1.6 (ref 1.2–2.2)
Albumin: 4.2 g/dL (ref 3.7–4.7)
Alkaline Phosphatase: 142 IU/L — ABNORMAL HIGH (ref 44–121)
BUN/Creatinine Ratio: 18 (ref 12–28)
BUN: 19 mg/dL (ref 8–27)
Bilirubin Total: 0.7 mg/dL (ref 0.0–1.2)
CO2: 20 mmol/L (ref 20–29)
Calcium: 9.8 mg/dL (ref 8.7–10.3)
Chloride: 105 mmol/L (ref 96–106)
Creatinine, Ser: 1.06 mg/dL — ABNORMAL HIGH (ref 0.57–1.00)
Globulin, Total: 2.7 g/dL (ref 1.5–4.5)
Glucose: 101 mg/dL — ABNORMAL HIGH (ref 70–99)
Potassium: 3.8 mmol/L (ref 3.5–5.2)
Sodium: 140 mmol/L (ref 134–144)
Total Protein: 6.9 g/dL (ref 6.0–8.5)
eGFR: 54 mL/min/{1.73_m2} — ABNORMAL LOW (ref >59)

## 2021-07-19 LAB — CBC WITH DIFFERENTIAL/PLATELET
Basophils Absolute: 0 10*3/uL (ref 0.0–0.2)
Basos: 0 %
EOS (ABSOLUTE): 0.1 10*3/uL (ref 0.0–0.4)
Eos: 1 %
Hematocrit: 39.8 % (ref 34.0–46.6)
Hemoglobin: 12.7 g/dL (ref 11.1–15.9)
Immature Grans (Abs): 0 10*3/uL (ref 0.0–0.1)
Immature Granulocytes: 0 %
Lymphocytes Absolute: 1.1 10*3/uL (ref 0.7–3.1)
Lymphs: 13 %
MCH: 28.2 pg (ref 26.6–33.0)
MCHC: 31.9 g/dL (ref 31.5–35.7)
MCV: 88 fL (ref 79–97)
Monocytes Absolute: 0.5 10*3/uL (ref 0.1–0.9)
Monocytes: 6 %
Neutrophils Absolute: 6.6 10*3/uL (ref 1.4–7.0)
Neutrophils: 80 %
Platelets: 347 10*3/uL (ref 150–450)
RBC: 4.5 x10E6/uL (ref 3.77–5.28)
RDW: 12.9 % (ref 11.7–15.4)
WBC: 8.3 10*3/uL (ref 3.4–10.8)

## 2021-07-21 ENCOUNTER — Other Ambulatory Visit (HOSPITAL_COMMUNITY): Payer: Self-pay

## 2021-07-24 ENCOUNTER — Other Ambulatory Visit (HOSPITAL_COMMUNITY): Payer: Self-pay

## 2021-07-25 DIAGNOSIS — I110 Hypertensive heart disease with heart failure: Secondary | ICD-10-CM | POA: Diagnosis not present

## 2021-07-25 DIAGNOSIS — I480 Paroxysmal atrial fibrillation: Secondary | ICD-10-CM | POA: Diagnosis not present

## 2021-07-25 DIAGNOSIS — I2510 Atherosclerotic heart disease of native coronary artery without angina pectoris: Secondary | ICD-10-CM | POA: Diagnosis not present

## 2021-07-25 DIAGNOSIS — C7931 Secondary malignant neoplasm of brain: Secondary | ICD-10-CM | POA: Diagnosis not present

## 2021-07-25 DIAGNOSIS — C3411 Malignant neoplasm of upper lobe, right bronchus or lung: Secondary | ICD-10-CM | POA: Diagnosis not present

## 2021-07-25 DIAGNOSIS — D630 Anemia in neoplastic disease: Secondary | ICD-10-CM | POA: Diagnosis not present

## 2021-07-25 DIAGNOSIS — I5041 Acute combined systolic (congestive) and diastolic (congestive) heart failure: Secondary | ICD-10-CM | POA: Diagnosis not present

## 2021-07-25 DIAGNOSIS — I471 Supraventricular tachycardia: Secondary | ICD-10-CM | POA: Diagnosis not present

## 2021-07-25 DIAGNOSIS — I420 Dilated cardiomyopathy: Secondary | ICD-10-CM | POA: Diagnosis not present

## 2021-07-26 ENCOUNTER — Other Ambulatory Visit (HOSPITAL_COMMUNITY): Payer: Self-pay

## 2021-07-27 ENCOUNTER — Telehealth: Payer: Self-pay

## 2021-07-27 ENCOUNTER — Other Ambulatory Visit (HOSPITAL_COMMUNITY): Payer: Self-pay

## 2021-07-27 LAB — LIPID PANEL

## 2021-07-27 LAB — SPECIMEN STATUS REPORT

## 2021-07-27 NOTE — Telephone Encounter (Signed)
Pts daughter LM stating she was returning a call from [place].  Pts daughter states the pt would also like her neck scanned because she feels there is something stuck in her neck.  I have called pt back back spoken with her daughter who advised pt wants to have her lab/CT on the same date. I have advised the CT scan has not been scheduled and that is likely who was calling her. She states she will contact Radiology Scheduling. I advised I can move the lab appt to correspond with the CT scan.

## 2021-08-03 DIAGNOSIS — I480 Paroxysmal atrial fibrillation: Secondary | ICD-10-CM | POA: Diagnosis not present

## 2021-08-03 DIAGNOSIS — F0390 Unspecified dementia without behavioral disturbance: Secondary | ICD-10-CM | POA: Diagnosis not present

## 2021-08-03 DIAGNOSIS — I420 Dilated cardiomyopathy: Secondary | ICD-10-CM | POA: Diagnosis not present

## 2021-08-03 DIAGNOSIS — R269 Unspecified abnormalities of gait and mobility: Secondary | ICD-10-CM | POA: Diagnosis not present

## 2021-08-03 DIAGNOSIS — I471 Supraventricular tachycardia: Secondary | ICD-10-CM | POA: Diagnosis not present

## 2021-08-03 DIAGNOSIS — I110 Hypertensive heart disease with heart failure: Secondary | ICD-10-CM | POA: Diagnosis not present

## 2021-08-03 DIAGNOSIS — I2510 Atherosclerotic heart disease of native coronary artery without angina pectoris: Secondary | ICD-10-CM | POA: Diagnosis not present

## 2021-08-03 DIAGNOSIS — I5041 Acute combined systolic (congestive) and diastolic (congestive) heart failure: Secondary | ICD-10-CM | POA: Diagnosis not present

## 2021-08-03 DIAGNOSIS — C7931 Secondary malignant neoplasm of brain: Secondary | ICD-10-CM | POA: Diagnosis not present

## 2021-08-03 DIAGNOSIS — D630 Anemia in neoplastic disease: Secondary | ICD-10-CM | POA: Diagnosis not present

## 2021-08-03 DIAGNOSIS — C3411 Malignant neoplasm of upper lobe, right bronchus or lung: Secondary | ICD-10-CM | POA: Diagnosis not present

## 2021-08-07 ENCOUNTER — Telehealth: Payer: Self-pay | Admitting: Medical Oncology

## 2021-08-07 ENCOUNTER — Telehealth: Payer: Self-pay | Admitting: Internal Medicine

## 2021-08-07 NOTE — Telephone Encounter (Signed)
R/s lab to same day as CT. Called and left msg for patients daughter

## 2021-08-07 NOTE — Telephone Encounter (Signed)
Please r/s Ct and f/u appt for end of jan. Done.

## 2021-08-08 ENCOUNTER — Other Ambulatory Visit: Payer: Medicare PPO

## 2021-08-08 DIAGNOSIS — I420 Dilated cardiomyopathy: Secondary | ICD-10-CM | POA: Diagnosis not present

## 2021-08-08 DIAGNOSIS — I2510 Atherosclerotic heart disease of native coronary artery without angina pectoris: Secondary | ICD-10-CM | POA: Diagnosis not present

## 2021-08-08 DIAGNOSIS — I471 Supraventricular tachycardia: Secondary | ICD-10-CM | POA: Diagnosis not present

## 2021-08-08 DIAGNOSIS — I480 Paroxysmal atrial fibrillation: Secondary | ICD-10-CM | POA: Diagnosis not present

## 2021-08-08 DIAGNOSIS — C7931 Secondary malignant neoplasm of brain: Secondary | ICD-10-CM | POA: Diagnosis not present

## 2021-08-08 DIAGNOSIS — I110 Hypertensive heart disease with heart failure: Secondary | ICD-10-CM | POA: Diagnosis not present

## 2021-08-08 DIAGNOSIS — I5041 Acute combined systolic (congestive) and diastolic (congestive) heart failure: Secondary | ICD-10-CM | POA: Diagnosis not present

## 2021-08-08 DIAGNOSIS — D630 Anemia in neoplastic disease: Secondary | ICD-10-CM | POA: Diagnosis not present

## 2021-08-08 DIAGNOSIS — C3411 Malignant neoplasm of upper lobe, right bronchus or lung: Secondary | ICD-10-CM | POA: Diagnosis not present

## 2021-08-09 ENCOUNTER — Ambulatory Visit: Payer: Medicare PPO | Admitting: Internal Medicine

## 2021-08-09 ENCOUNTER — Ambulatory Visit (HOSPITAL_COMMUNITY): Payer: Medicare PPO

## 2021-08-09 ENCOUNTER — Other Ambulatory Visit: Payer: Medicare PPO

## 2021-08-11 ENCOUNTER — Telehealth: Payer: Self-pay

## 2021-08-11 NOTE — Telephone Encounter (Signed)
Pt called from [facility], pt complaining of diarrhea for 2 days without relief. Pt instructed to take imodium and drink fluids to rehydrate. Pt agrees and has no further questions at this time.

## 2021-08-15 ENCOUNTER — Ambulatory Visit: Payer: Medicare PPO | Admitting: Internal Medicine

## 2021-08-15 DIAGNOSIS — I420 Dilated cardiomyopathy: Secondary | ICD-10-CM | POA: Diagnosis not present

## 2021-08-15 DIAGNOSIS — C7931 Secondary malignant neoplasm of brain: Secondary | ICD-10-CM | POA: Diagnosis not present

## 2021-08-15 DIAGNOSIS — C3411 Malignant neoplasm of upper lobe, right bronchus or lung: Secondary | ICD-10-CM | POA: Diagnosis not present

## 2021-08-15 DIAGNOSIS — I471 Supraventricular tachycardia: Secondary | ICD-10-CM | POA: Diagnosis not present

## 2021-08-15 DIAGNOSIS — I5041 Acute combined systolic (congestive) and diastolic (congestive) heart failure: Secondary | ICD-10-CM | POA: Diagnosis not present

## 2021-08-15 DIAGNOSIS — D630 Anemia in neoplastic disease: Secondary | ICD-10-CM | POA: Diagnosis not present

## 2021-08-15 DIAGNOSIS — I480 Paroxysmal atrial fibrillation: Secondary | ICD-10-CM | POA: Diagnosis not present

## 2021-08-15 DIAGNOSIS — I2510 Atherosclerotic heart disease of native coronary artery without angina pectoris: Secondary | ICD-10-CM | POA: Diagnosis not present

## 2021-08-15 DIAGNOSIS — I110 Hypertensive heart disease with heart failure: Secondary | ICD-10-CM | POA: Diagnosis not present

## 2021-08-17 DIAGNOSIS — I471 Supraventricular tachycardia: Secondary | ICD-10-CM | POA: Diagnosis not present

## 2021-08-17 DIAGNOSIS — C7931 Secondary malignant neoplasm of brain: Secondary | ICD-10-CM | POA: Diagnosis not present

## 2021-08-17 DIAGNOSIS — I2510 Atherosclerotic heart disease of native coronary artery without angina pectoris: Secondary | ICD-10-CM | POA: Diagnosis not present

## 2021-08-17 DIAGNOSIS — D630 Anemia in neoplastic disease: Secondary | ICD-10-CM | POA: Diagnosis not present

## 2021-08-17 DIAGNOSIS — I110 Hypertensive heart disease with heart failure: Secondary | ICD-10-CM | POA: Diagnosis not present

## 2021-08-17 DIAGNOSIS — I5041 Acute combined systolic (congestive) and diastolic (congestive) heart failure: Secondary | ICD-10-CM | POA: Diagnosis not present

## 2021-08-17 DIAGNOSIS — I480 Paroxysmal atrial fibrillation: Secondary | ICD-10-CM | POA: Diagnosis not present

## 2021-08-17 DIAGNOSIS — C3411 Malignant neoplasm of upper lobe, right bronchus or lung: Secondary | ICD-10-CM | POA: Diagnosis not present

## 2021-08-17 DIAGNOSIS — I420 Dilated cardiomyopathy: Secondary | ICD-10-CM | POA: Diagnosis not present

## 2021-08-21 ENCOUNTER — Other Ambulatory Visit (HOSPITAL_COMMUNITY): Payer: Self-pay

## 2021-08-22 ENCOUNTER — Encounter (HOSPITAL_COMMUNITY): Payer: Self-pay

## 2021-08-22 ENCOUNTER — Emergency Department (HOSPITAL_COMMUNITY): Payer: Medicare PPO

## 2021-08-22 ENCOUNTER — Emergency Department (HOSPITAL_COMMUNITY)
Admission: EM | Admit: 2021-08-22 | Discharge: 2021-08-23 | Disposition: A | Discharge status: Skilled Nursing Facility | Payer: Medicare PPO | Attending: Emergency Medicine | Admitting: Emergency Medicine

## 2021-08-22 DIAGNOSIS — J90 Pleural effusion, not elsewhere classified: Secondary | ICD-10-CM | POA: Diagnosis not present

## 2021-08-22 DIAGNOSIS — Z20822 Contact with and (suspected) exposure to covid-19: Secondary | ICD-10-CM | POA: Diagnosis not present

## 2021-08-22 DIAGNOSIS — Z79899 Other long term (current) drug therapy: Secondary | ICD-10-CM | POA: Diagnosis not present

## 2021-08-22 DIAGNOSIS — F0390 Unspecified dementia without behavioral disturbance: Secondary | ICD-10-CM | POA: Diagnosis not present

## 2021-08-22 DIAGNOSIS — R062 Wheezing: Secondary | ICD-10-CM | POA: Diagnosis not present

## 2021-08-22 DIAGNOSIS — I959 Hypotension, unspecified: Secondary | ICD-10-CM | POA: Diagnosis not present

## 2021-08-22 DIAGNOSIS — N390 Urinary tract infection, site not specified: Secondary | ICD-10-CM | POA: Diagnosis not present

## 2021-08-22 DIAGNOSIS — R531 Weakness: Secondary | ICD-10-CM | POA: Diagnosis not present

## 2021-08-22 DIAGNOSIS — R404 Transient alteration of awareness: Secondary | ICD-10-CM | POA: Diagnosis not present

## 2021-08-22 LAB — CBC WITH DIFFERENTIAL/PLATELET
Abs Immature Granulocytes: 0.02 10*3/uL (ref 0.00–0.07)
Basophils Absolute: 0 10*3/uL (ref 0.0–0.1)
Basophils Relative: 0 %
Eosinophils Absolute: 0 10*3/uL (ref 0.0–0.5)
Eosinophils Relative: 0 %
HCT: 37.1 % (ref 36.0–46.0)
Hemoglobin: 11.2 g/dL — ABNORMAL LOW (ref 12.0–15.0)
Immature Granulocytes: 0 %
Lymphocytes Relative: 11 %
Lymphs Abs: 1.1 10*3/uL (ref 0.7–4.0)
MCH: 28.8 pg (ref 26.0–34.0)
MCHC: 30.2 g/dL (ref 30.0–36.0)
MCV: 95.4 fL (ref 80.0–100.0)
Monocytes Absolute: 0.5 10*3/uL (ref 0.1–1.0)
Monocytes Relative: 5 %
Neutro Abs: 8 10*3/uL — ABNORMAL HIGH (ref 1.7–7.7)
Neutrophils Relative %: 84 %
Platelets: 311 10*3/uL (ref 150–400)
RBC: 3.89 MIL/uL (ref 3.87–5.11)
RDW: 14.6 % (ref 11.5–15.5)
WBC: 9.7 10*3/uL (ref 4.0–10.5)
nRBC: 0 % (ref 0.0–0.2)

## 2021-08-22 LAB — RESP PANEL BY RT-PCR (FLU A&B, COVID) ARPGX2
Influenza A by PCR: NEGATIVE
Influenza B by PCR: NEGATIVE
SARS Coronavirus 2 by RT PCR: NEGATIVE

## 2021-08-22 LAB — URINALYSIS, ROUTINE W REFLEX MICROSCOPIC
Bilirubin Urine: NEGATIVE
Glucose, UA: NEGATIVE mg/dL
Ketones, ur: NEGATIVE mg/dL
Nitrite: NEGATIVE
Protein, ur: 100 mg/dL — AB
Specific Gravity, Urine: 1.014 (ref 1.005–1.030)
WBC, UA: >50 WBC/hpf — ABNORMAL HIGH (ref 0–5)
pH: 6 (ref 5.0–8.0)

## 2021-08-22 LAB — BASIC METABOLIC PANEL
Anion gap: 7 (ref 5–15)
BUN: 20 mg/dL (ref 8–23)
CO2: 24 mmol/L (ref 22–32)
Calcium: 8.9 mg/dL (ref 8.9–10.3)
Chloride: 108 mmol/L (ref 98–111)
Creatinine, Ser: 0.9 mg/dL (ref 0.44–1.00)
GFR, Estimated: >60 mL/min (ref >60)
Glucose, Bld: 102 mg/dL — ABNORMAL HIGH (ref 70–99)
Potassium: 3.5 mmol/L (ref 3.5–5.1)
Sodium: 139 mmol/L (ref 135–145)

## 2021-08-22 MED ORDER — CEPHALEXIN 250 MG PO CAPS
250.0000 mg | ORAL_CAPSULE | Freq: Once | ORAL | Status: AC
  Administered 2021-08-22: 250 mg via ORAL
  Filled 2021-08-22: qty 1

## 2021-08-22 MED ORDER — CEPHALEXIN 250 MG PO CAPS: 250.0000 mg | ORAL_CAPSULE | Freq: Four times a day (QID) | ORAL | 0 refills | Status: DC

## 2021-08-22 NOTE — Discharge Instructions (Signed)
Your weakness is likely because of a urinary tract infection.  Make sure you are getting plenty of rest, drink a lot of fluids and take the antibiotic prescription as directed.  Begin this prescription tomorrow morning.  Follow-up with your doctor for checkup in 1 week and as needed.

## 2021-08-22 NOTE — ED Triage Notes (Signed)
Pt presents via EMS from [facility]. EMS reports that they are sending her here because the facility said it took more than 2 people to help her up today. Per EMS, pt is alert and oriented per her normal. On pt's chart, it says she has dementia, but pt says "I don't have that". Pt is not able to stand on her own per EMS. Pt is able to answer questions and is not c/o any pain or weakness. Facility just reports that she appears weak, which is consistent with previous UTI's for the patient.

## 2021-08-22 NOTE — ED Notes (Signed)
[facility] called for transport.  

## 2021-08-22 NOTE — ED Provider Notes (Addendum)
[facility] DEPT Provider Note   CSN: [id number] Arrival date & time: 08/22/21  1534     History  Chief Complaint  Patient presents with   Weakness    Valerie [name] is a 76 y.o. female.  HPI She presents for evaluation of weakness characterized by requiring more assistance at her nursing care facility.  Patient is unaware of any particular disability or problem.  Level 5 caveat-infusion    Home Medications Prior to Admission medications   Medication Sig Start Date End Date Taking? Authorizing Provider  cephALEXin (KEFLEX) 250 MG capsule Take 1 capsule (250 mg total) by mouth 4 (four) times daily. 08/22/21  Yes Daleen Bo, MD  Ascorbic Acid (VITAMIN C) 100 MG tablet Take 100 mg by mouth every evening. Gummie    [provider]  cetaphil (CETAPHIL) lotion Apply 1 application topically daily as needed for dry skin.    [provider]  Cholecalciferol (D3 PO) Take 1 capsule by mouth daily.    [provider]  donepezil (ARICEPT) 10 MG tablet TAKE 1 TABLET BY MOUTH ONCE DAILY IN THE EVENING Patient taking differently: Take 10 mg by mouth at bedtime. 04/03/21   [name], MD  erlotinib (TARCEVA) 100 MG tablet TAKE 1 TABLET (100 MG TOTAL) BY MOUTH DAILY. TAKE ON AN EMPTY STOMACH 1 HOUR BEFORE A MEAL OR 2 HOURS AFTER. 05/30/21 05/30/22  Curt Bears, MD  loperamide (IMODIUM A-D) 2 MG tablet Take 4 mg by mouth 3 (three) times daily as needed for diarrhea or loose stools.    [provider]  MAGNESIUM PO Take 200 mg by mouth daily. Patient not taking: Reported on 07/18/2021    [provider]  Multiple Vitamin (MULTI-VITAMINS) TABS Take 1 tablet by mouth every evening.     [provider]  prochlorperazine (COMPAZINE) 10 MG tablet Take 1 tablet (10 mg total) by mouth every 6 (six) hours as needed for nausea or vomiting. 05/30/20   Curt Bears, MD  vitamin B-12 (CYANOCOBALAMIN) 1000 MCG  tablet Take 1,000 mcg by mouth every evening.    [provider]      Allergies    Sulfa antibiotics    Review of Systems   Review of Systems  Unable to perform ROS: Mental status change   Physical Exam Updated Vital Signs BP 128/67    Pulse 94    Temp 98.2 F (36.8 C) (Oral)    Resp (!) 21    SpO2 97%  Physical Exam Vitals and nursing note reviewed.  Constitutional:      General: She is not in acute distress.    Appearance: She is well-developed. She is not ill-appearing.  HENT:     Head: Normocephalic and atraumatic.     Right Ear: External ear normal.     Left Ear: External ear normal.     Nose: No congestion.  Eyes:     Conjunctiva/sclera: Conjunctivae normal.     Pupils: Pupils are equal, round, and reactive to light.  Neck:     Trachea: Phonation normal.  Cardiovascular:     Rate and Rhythm: Normal rate and regular rhythm.     Heart sounds: Normal heart sounds.  Pulmonary:     Effort: Pulmonary effort is normal.     Comments: Scattered wheezes, decreased breath sounds left side.  No rhonchi or rales. No increased work of breathing. Abdominal:     General: There is no distension.     Palpations: Abdomen  is soft.     Tenderness: There is no abdominal tenderness.  Musculoskeletal:        General: Normal range of motion.     Cervical back: Normal range of motion and neck supple.     Comments: Marked kyphoscoliosis  Skin:    General: Skin is warm and dry.  Neurological:     Mental Status: She is alert and oriented to person, place, and time.     Cranial Nerves: No cranial nerve deficit.     Sensory: No sensory deficit.     Motor: No weakness or abnormal muscle tone.     Coordination: Coordination normal.  Psychiatric:        Mood and Affect: Mood normal.        Behavior: Behavior normal.        Thought Content: Thought content normal.        Judgment: Judgment normal.    ED Results / Procedures / Treatments   Labs (all labs ordered are listed, but  only abnormal results are displayed) Labs Reviewed  URINALYSIS, ROUTINE W REFLEX MICROSCOPIC - Abnormal; Notable for the following components:      Result Value   Color, Urine AMBER (*)    APPearance CLOUDY (*)    Hgb urine dipstick MODERATE (*)    Protein, ur 100 (*)    Leukocytes,Ua LARGE (*)    WBC, UA >50 (*)    Bacteria, UA RARE (*)    All other components within normal limits  BASIC METABOLIC PANEL - Abnormal; Notable for the following components:   Glucose, Bld 102 (*)    All other components within normal limits  CBC WITH DIFFERENTIAL/PLATELET - Abnormal; Notable for the following components:   Hemoglobin 11.2 (*)    Neutro Abs 8.0 (*)    All other components within normal limits  RESP PANEL BY RT-PCR (FLU A&B, COVID) ARPGX2  URINE CULTURE    EKG None  Radiology DG Chest 2 View  Result Date: 08/22/2021 CLINICAL DATA:  Wheezing. EXAM: CHEST - 2 VIEW COMPARISON:  Chest x-ray 06/03/2020.  Chest CT 02/23/2021. FINDINGS: There is moderate elevation of the left hemidiaphragm similar to the prior study. The cardiomediastinal silhouette is grossly within normal limits given degree of patient rotation. There is no new focal lung infiltrate or pneumothorax identified. Small left pleural effusion is unchanged. There is some linear scarring in the superior segment of the left lower lobe seen best on the lateral view similar to the prior CT. The right lung is grossly clear. Healed left rib fractures are again noted. IMPRESSION: 1. Stable examination. Small left pleural effusion with linear scarring in the superior segment of the left lower lobe. Electronically Signed   By: Ronney Asters M.D.   On: 08/22/2021 16:40    Procedures Procedures    Medications Ordered in ED Medications  cephALEXin (KEFLEX) capsule 250 mg (has no administration in time range)    ED Course/ Medical Decision Making/ A&P                           Medical Decision Making   Patient Vitals for the past 24  hrs:  BP Temp Temp src Pulse Resp SpO2  08/22/21 1930 128/67 -- -- 94 (!) 21 97 %  08/22/21 1830 (!) 109/59 -- -- 98 (!) 21 99 %  08/22/21 1715 125/65 -- -- 91 19 99 %  08/22/21 1547 118/87 98.2 F (36.8 C) Oral  94 19 100 %    7:40 PM Reevaluation with update and discussion with patient and her daughter Ms. Jimmye Norman was contacted numbers listed on the chart. After initial assessment and treatment, an updated evaluation reveals patient is comfortable and stable.. Illness risk, worsening condition, discussed. Daleen Bo    Medical Decision Making: Summary: Elderly female who lives in a nursing care facility presenting for decline and ability to care for self is requiring more assistance.  Vital signs normal on arrival.  She is debilitated, and unable to give complete history due to dementia  Critical Interventions-clinical evaluation, laboratory testing, radiography, orthostatic blood pressure and pulse, observation and reassess; to evaluate  Chief Complaint  Patient presents with   Weakness    and assess for illness characterized as Acute, Previously Undiagnosed, Uncertain Prognosis, Complicated, and Systemic Symptoms   After These Interventions, the Patient was reevaluated and was found stable for discharge.  Patient with UTI, likely explaining need for increased assistance.  She does live in assisted living facility where she can be managed.  Consideration of comorbidities and presenting complaint with laboratory findings for hospitalization find that ; she does not have a disability requiring hospitalization at this time.  This patient is Presenting for Evaluation of weakness, which does require a range of treatment options, and is a complaint that involves a moderate risk of morbidity and mortality.  Pertinent comorbidities include lung cancer status postresection, metastatic cancer to brain, dementia, urinary tract infection, chronic diarrhea, DNR status  The Differential Diagnoses  include acute illness, metabolic disorder, decompensated chronic illness.  I obtained additional Historical Information from the patient's daughter by telephone, as the patient is a poor historian.  I decided to review pertinent External Data, and in summary on 08/11/2021 she was treated with Imodium for ongoing diarrhea by her oncologist.  1 month ago she was seen to initiate care at her nursing facility by her primary care doctor.  Noted to be on chronic suppressive therapy for lung cancer, and to have mild dementia.   Clinical Laboratory Tests Ordered, included CBC, Metabolic panel, and Urinalysis. Review indicates normal except hemoglobin slightly low, urinalysis consistent with infection, urine culture ordered. Emergent testing abnormality management required for stabilization-treatment of UTI  Radiologic Tests Ordered, included chest x-ray.  I independently Visualized: Radiographic images, which show no infiltrate or edema   Cardiac Monitor Tracing which shows normal sinus rhythm, indicating stable cardiac status   Pharmaceutical Risk Management prescription medications for UTI Prescription Management  Social Determinants of Health Risk patient has dementia.  She is DNR.    CRITICAL CARE-no Performed by: Daleen Bo  Nursing Notes Reviewed/ Care Coordinated Applicable Imaging Reviewed Interpretation of Laboratory Data incorporated into ED treatment  The patient appears reasonably screened and/or stabilized for discharge and I doubt any other medical condition or other [facility] requiring further screening, evaluation, or treatment in the ED at this time prior to discharge.  Plan: Home Medications-continue usual medicine use Tylenol if needed for fever; Home Treatments-get plenty of rest and drink a lot of fluids, gradually increase activity; return here if the recommended treatment, does not improve the symptoms; Recommended follow up-PCP follow-up 1 week and as  needed            Final Clinical Impression(s) / ED Diagnoses Final diagnoses:  Urinary tract infection without hematuria, site unspecified    Rx / DC Orders ED Discharge Orders          Ordered    cephALEXin (KEFLEX) 250  MG capsule  4 times daily        08/22/21 1951              Daleen Bo, MD 08/22/21 Earney Navy    Daleen Bo, MD 08/22/21 2000

## 2021-08-22 NOTE — ED Notes (Signed)
Pt given a drink and sandwich.

## 2021-08-23 ENCOUNTER — Telehealth: Payer: Self-pay

## 2021-08-23 ENCOUNTER — Telehealth: Payer: Medicare PPO

## 2021-08-23 DIAGNOSIS — R531 Weakness: Secondary | ICD-10-CM | POA: Diagnosis not present

## 2021-08-23 DIAGNOSIS — I959 Hypotension, unspecified: Secondary | ICD-10-CM | POA: Diagnosis not present

## 2021-08-23 DIAGNOSIS — I420 Dilated cardiomyopathy: Secondary | ICD-10-CM | POA: Diagnosis not present

## 2021-08-23 DIAGNOSIS — D630 Anemia in neoplastic disease: Secondary | ICD-10-CM | POA: Diagnosis not present

## 2021-08-23 DIAGNOSIS — C7931 Secondary malignant neoplasm of brain: Secondary | ICD-10-CM | POA: Diagnosis not present

## 2021-08-23 DIAGNOSIS — I110 Hypertensive heart disease with heart failure: Secondary | ICD-10-CM | POA: Diagnosis not present

## 2021-08-23 DIAGNOSIS — I5041 Acute combined systolic (congestive) and diastolic (congestive) heart failure: Secondary | ICD-10-CM | POA: Diagnosis not present

## 2021-08-23 DIAGNOSIS — Z7401 Bed confinement status: Secondary | ICD-10-CM | POA: Diagnosis not present

## 2021-08-23 DIAGNOSIS — I2510 Atherosclerotic heart disease of native coronary artery without angina pectoris: Secondary | ICD-10-CM | POA: Diagnosis not present

## 2021-08-23 DIAGNOSIS — I480 Paroxysmal atrial fibrillation: Secondary | ICD-10-CM | POA: Diagnosis not present

## 2021-08-23 DIAGNOSIS — I471 Supraventricular tachycardia: Secondary | ICD-10-CM | POA: Diagnosis not present

## 2021-08-23 DIAGNOSIS — C3411 Malignant neoplasm of upper lobe, right bronchus or lung: Secondary | ICD-10-CM | POA: Diagnosis not present

## 2021-08-23 NOTE — Telephone Encounter (Signed)
°  Care Management   Follow Up Note   08/23/2021 Name: Valerie [name] MRN: [id number] DOB: [birth year]   Referred by: [name], MD Reason for referral : Chronic Care Management (RN CM Follow up call )   An unsuccessful telephone outreach was attempted today. The patient was referred to the case management team for assistance with care management and care coordination.   Follow Up Plan: Telephone follow up appointment with care management team member scheduled for: 09/22/21  Barb Merino, RN, BSN, CCM Care Management Coordinator [facility]  Direct Phone: [phone]

## 2021-08-24 LAB — URINE CULTURE

## 2021-08-25 DIAGNOSIS — I420 Dilated cardiomyopathy: Secondary | ICD-10-CM | POA: Diagnosis not present

## 2021-08-25 DIAGNOSIS — I2510 Atherosclerotic heart disease of native coronary artery without angina pectoris: Secondary | ICD-10-CM | POA: Diagnosis not present

## 2021-08-25 DIAGNOSIS — I5041 Acute combined systolic (congestive) and diastolic (congestive) heart failure: Secondary | ICD-10-CM | POA: Diagnosis not present

## 2021-08-25 DIAGNOSIS — I110 Hypertensive heart disease with heart failure: Secondary | ICD-10-CM | POA: Diagnosis not present

## 2021-08-25 DIAGNOSIS — D630 Anemia in neoplastic disease: Secondary | ICD-10-CM | POA: Diagnosis not present

## 2021-08-25 DIAGNOSIS — C7931 Secondary malignant neoplasm of brain: Secondary | ICD-10-CM | POA: Diagnosis not present

## 2021-08-25 DIAGNOSIS — C3411 Malignant neoplasm of upper lobe, right bronchus or lung: Secondary | ICD-10-CM | POA: Diagnosis not present

## 2021-08-25 DIAGNOSIS — I471 Supraventricular tachycardia: Secondary | ICD-10-CM | POA: Diagnosis not present

## 2021-08-25 DIAGNOSIS — I480 Paroxysmal atrial fibrillation: Secondary | ICD-10-CM | POA: Diagnosis not present

## 2021-08-28 DIAGNOSIS — C3411 Malignant neoplasm of upper lobe, right bronchus or lung: Secondary | ICD-10-CM | POA: Diagnosis not present

## 2021-08-28 DIAGNOSIS — I2510 Atherosclerotic heart disease of native coronary artery without angina pectoris: Secondary | ICD-10-CM | POA: Diagnosis not present

## 2021-08-28 DIAGNOSIS — I5041 Acute combined systolic (congestive) and diastolic (congestive) heart failure: Secondary | ICD-10-CM | POA: Diagnosis not present

## 2021-08-28 DIAGNOSIS — I480 Paroxysmal atrial fibrillation: Secondary | ICD-10-CM | POA: Diagnosis not present

## 2021-08-28 DIAGNOSIS — D630 Anemia in neoplastic disease: Secondary | ICD-10-CM | POA: Diagnosis not present

## 2021-08-28 DIAGNOSIS — I110 Hypertensive heart disease with heart failure: Secondary | ICD-10-CM | POA: Diagnosis not present

## 2021-08-28 DIAGNOSIS — I420 Dilated cardiomyopathy: Secondary | ICD-10-CM | POA: Diagnosis not present

## 2021-08-28 DIAGNOSIS — I471 Supraventricular tachycardia: Secondary | ICD-10-CM | POA: Diagnosis not present

## 2021-08-28 DIAGNOSIS — C7931 Secondary malignant neoplasm of brain: Secondary | ICD-10-CM | POA: Diagnosis not present

## 2021-08-29 ENCOUNTER — Other Ambulatory Visit (HOSPITAL_COMMUNITY): Payer: Self-pay

## 2021-08-29 ENCOUNTER — Other Ambulatory Visit: Payer: Self-pay | Admitting: Internal Medicine

## 2021-08-29 DIAGNOSIS — I420 Dilated cardiomyopathy: Secondary | ICD-10-CM | POA: Diagnosis not present

## 2021-08-29 DIAGNOSIS — F331 Major depressive disorder, recurrent, moderate: Secondary | ICD-10-CM | POA: Diagnosis not present

## 2021-08-29 DIAGNOSIS — I480 Paroxysmal atrial fibrillation: Secondary | ICD-10-CM | POA: Diagnosis not present

## 2021-08-29 DIAGNOSIS — I110 Hypertensive heart disease with heart failure: Secondary | ICD-10-CM | POA: Diagnosis not present

## 2021-08-29 DIAGNOSIS — I471 Supraventricular tachycardia: Secondary | ICD-10-CM | POA: Diagnosis not present

## 2021-08-29 DIAGNOSIS — I5041 Acute combined systolic (congestive) and diastolic (congestive) heart failure: Secondary | ICD-10-CM | POA: Diagnosis not present

## 2021-08-29 DIAGNOSIS — D630 Anemia in neoplastic disease: Secondary | ICD-10-CM | POA: Diagnosis not present

## 2021-08-29 DIAGNOSIS — C3411 Malignant neoplasm of upper lobe, right bronchus or lung: Secondary | ICD-10-CM | POA: Diagnosis not present

## 2021-08-29 DIAGNOSIS — I2510 Atherosclerotic heart disease of native coronary artery without angina pectoris: Secondary | ICD-10-CM | POA: Diagnosis not present

## 2021-08-29 DIAGNOSIS — C7931 Secondary malignant neoplasm of brain: Secondary | ICD-10-CM | POA: Diagnosis not present

## 2021-08-29 DIAGNOSIS — F0390 Unspecified dementia without behavioral disturbance: Secondary | ICD-10-CM | POA: Diagnosis not present

## 2021-08-29 MED ORDER — ERLOTINIB HCL 100 MG PO TABS
ORAL_TABLET | ORAL | 2 refills | Status: DC
  Filled 2021-08-29: qty 30, 30d supply, fill #0

## 2021-08-30 ENCOUNTER — Other Ambulatory Visit (HOSPITAL_COMMUNITY): Payer: Self-pay

## 2021-08-30 DIAGNOSIS — I420 Dilated cardiomyopathy: Secondary | ICD-10-CM | POA: Diagnosis not present

## 2021-08-30 DIAGNOSIS — I2510 Atherosclerotic heart disease of native coronary artery without angina pectoris: Secondary | ICD-10-CM | POA: Diagnosis not present

## 2021-08-30 DIAGNOSIS — D630 Anemia in neoplastic disease: Secondary | ICD-10-CM | POA: Diagnosis not present

## 2021-08-30 DIAGNOSIS — I480 Paroxysmal atrial fibrillation: Secondary | ICD-10-CM | POA: Diagnosis not present

## 2021-08-30 DIAGNOSIS — C7931 Secondary malignant neoplasm of brain: Secondary | ICD-10-CM | POA: Diagnosis not present

## 2021-08-30 DIAGNOSIS — I110 Hypertensive heart disease with heart failure: Secondary | ICD-10-CM | POA: Diagnosis not present

## 2021-08-30 DIAGNOSIS — C3411 Malignant neoplasm of upper lobe, right bronchus or lung: Secondary | ICD-10-CM | POA: Diagnosis not present

## 2021-08-30 DIAGNOSIS — I471 Supraventricular tachycardia: Secondary | ICD-10-CM | POA: Diagnosis not present

## 2021-08-30 DIAGNOSIS — I5041 Acute combined systolic (congestive) and diastolic (congestive) heart failure: Secondary | ICD-10-CM | POA: Diagnosis not present

## 2021-08-31 DIAGNOSIS — F0390 Unspecified dementia without behavioral disturbance: Secondary | ICD-10-CM | POA: Diagnosis not present

## 2021-09-05 DIAGNOSIS — I471 Supraventricular tachycardia: Secondary | ICD-10-CM | POA: Diagnosis not present

## 2021-09-05 DIAGNOSIS — I110 Hypertensive heart disease with heart failure: Secondary | ICD-10-CM | POA: Diagnosis not present

## 2021-09-05 DIAGNOSIS — I480 Paroxysmal atrial fibrillation: Secondary | ICD-10-CM | POA: Diagnosis not present

## 2021-09-05 DIAGNOSIS — C7931 Secondary malignant neoplasm of brain: Secondary | ICD-10-CM | POA: Diagnosis not present

## 2021-09-05 DIAGNOSIS — I5041 Acute combined systolic (congestive) and diastolic (congestive) heart failure: Secondary | ICD-10-CM | POA: Diagnosis not present

## 2021-09-05 DIAGNOSIS — D630 Anemia in neoplastic disease: Secondary | ICD-10-CM | POA: Diagnosis not present

## 2021-09-05 DIAGNOSIS — I420 Dilated cardiomyopathy: Secondary | ICD-10-CM | POA: Diagnosis not present

## 2021-09-05 DIAGNOSIS — I2510 Atherosclerotic heart disease of native coronary artery without angina pectoris: Secondary | ICD-10-CM | POA: Diagnosis not present

## 2021-09-05 DIAGNOSIS — C3411 Malignant neoplasm of upper lobe, right bronchus or lung: Secondary | ICD-10-CM | POA: Diagnosis not present

## 2021-09-06 DIAGNOSIS — I2510 Atherosclerotic heart disease of native coronary artery without angina pectoris: Secondary | ICD-10-CM | POA: Diagnosis not present

## 2021-09-06 DIAGNOSIS — D630 Anemia in neoplastic disease: Secondary | ICD-10-CM | POA: Diagnosis not present

## 2021-09-06 DIAGNOSIS — I420 Dilated cardiomyopathy: Secondary | ICD-10-CM | POA: Diagnosis not present

## 2021-09-06 DIAGNOSIS — I5041 Acute combined systolic (congestive) and diastolic (congestive) heart failure: Secondary | ICD-10-CM | POA: Diagnosis not present

## 2021-09-06 DIAGNOSIS — I471 Supraventricular tachycardia: Secondary | ICD-10-CM | POA: Diagnosis not present

## 2021-09-06 DIAGNOSIS — C3411 Malignant neoplasm of upper lobe, right bronchus or lung: Secondary | ICD-10-CM | POA: Diagnosis not present

## 2021-09-06 DIAGNOSIS — C7931 Secondary malignant neoplasm of brain: Secondary | ICD-10-CM | POA: Diagnosis not present

## 2021-09-06 DIAGNOSIS — I480 Paroxysmal atrial fibrillation: Secondary | ICD-10-CM | POA: Diagnosis not present

## 2021-09-06 DIAGNOSIS — I110 Hypertensive heart disease with heart failure: Secondary | ICD-10-CM | POA: Diagnosis not present

## 2021-09-07 DIAGNOSIS — D630 Anemia in neoplastic disease: Secondary | ICD-10-CM | POA: Diagnosis not present

## 2021-09-07 DIAGNOSIS — C7931 Secondary malignant neoplasm of brain: Secondary | ICD-10-CM | POA: Diagnosis not present

## 2021-09-07 DIAGNOSIS — I110 Hypertensive heart disease with heart failure: Secondary | ICD-10-CM | POA: Diagnosis not present

## 2021-09-07 DIAGNOSIS — I5041 Acute combined systolic (congestive) and diastolic (congestive) heart failure: Secondary | ICD-10-CM | POA: Diagnosis not present

## 2021-09-07 DIAGNOSIS — I480 Paroxysmal atrial fibrillation: Secondary | ICD-10-CM | POA: Diagnosis not present

## 2021-09-07 DIAGNOSIS — I471 Supraventricular tachycardia: Secondary | ICD-10-CM | POA: Diagnosis not present

## 2021-09-07 DIAGNOSIS — I420 Dilated cardiomyopathy: Secondary | ICD-10-CM | POA: Diagnosis not present

## 2021-09-07 DIAGNOSIS — C3411 Malignant neoplasm of upper lobe, right bronchus or lung: Secondary | ICD-10-CM | POA: Diagnosis not present

## 2021-09-07 DIAGNOSIS — I2510 Atherosclerotic heart disease of native coronary artery without angina pectoris: Secondary | ICD-10-CM | POA: Diagnosis not present

## 2021-09-07 IMAGING — DX DG ABDOMEN 1V
2 series · 2 of 2 positions shown · non-contrast
Comparison: None.

CLINICAL DATA: Abdominal pain

EXAM:
ABDOMEN - 1 VIEW

[abdomen kub (1 of 2)]
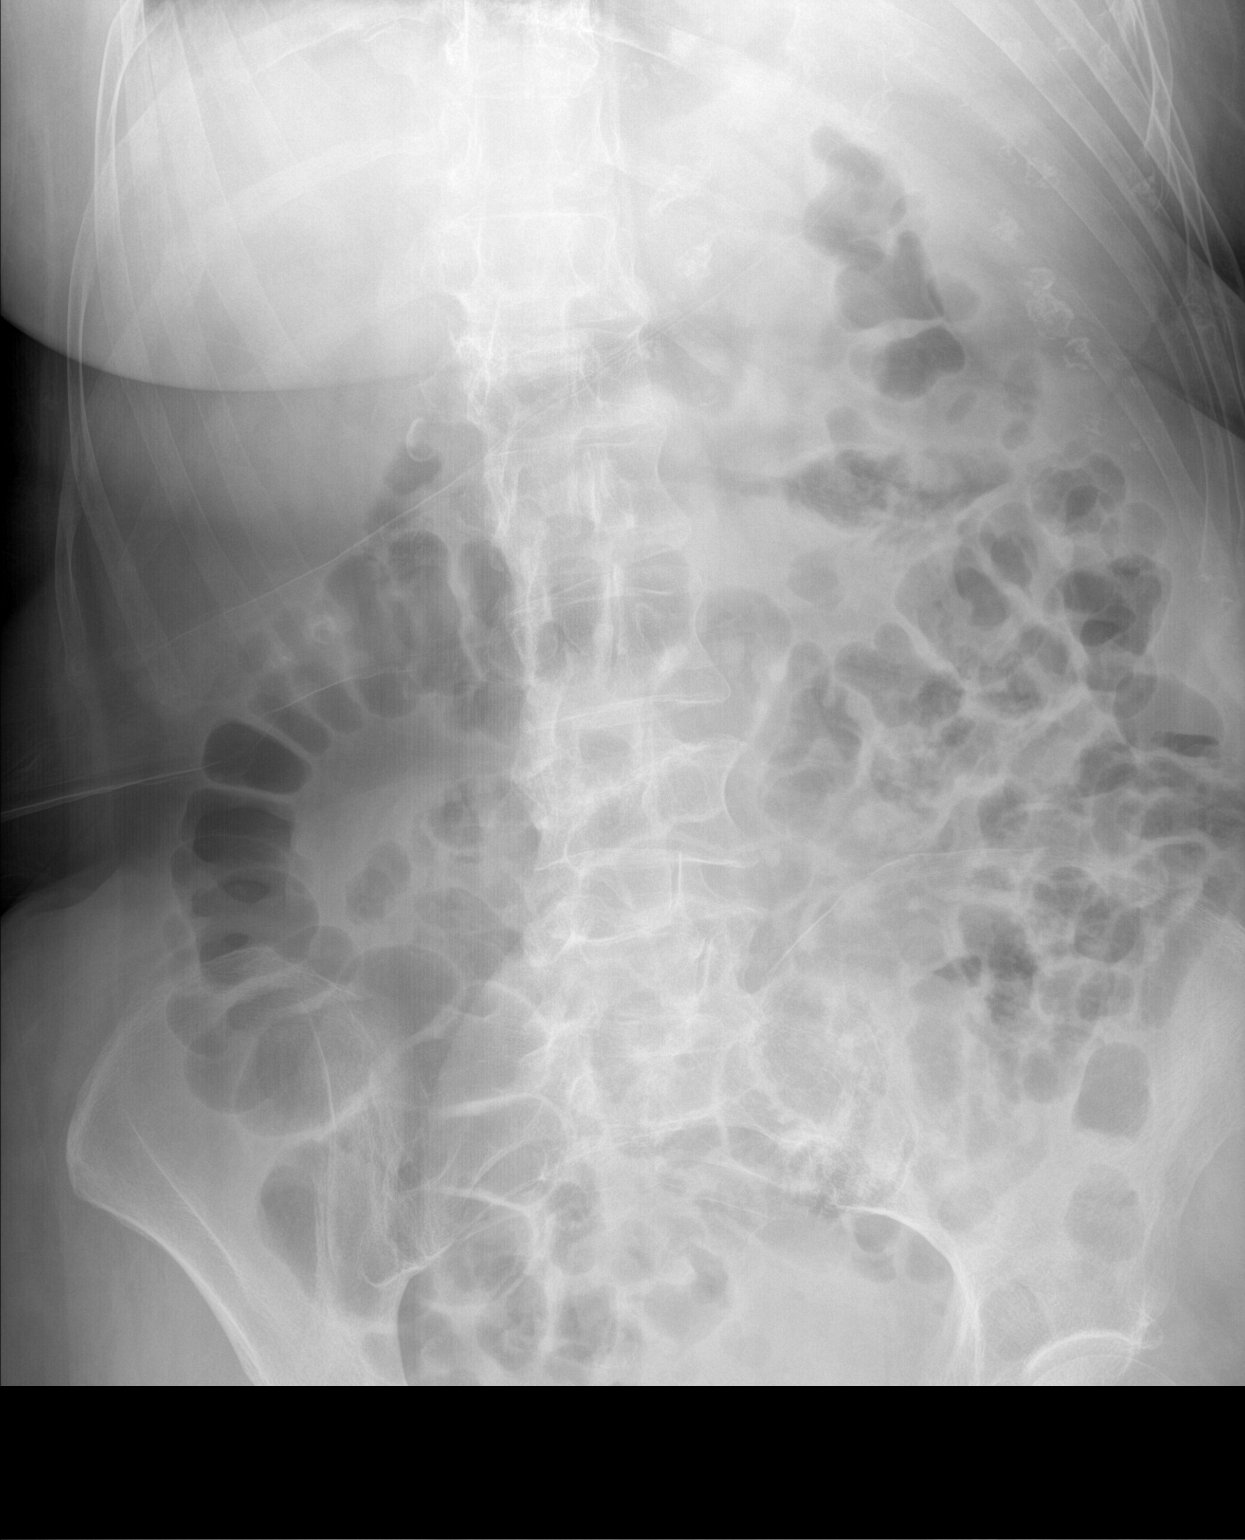

[abdomen kub (2 of 2)]
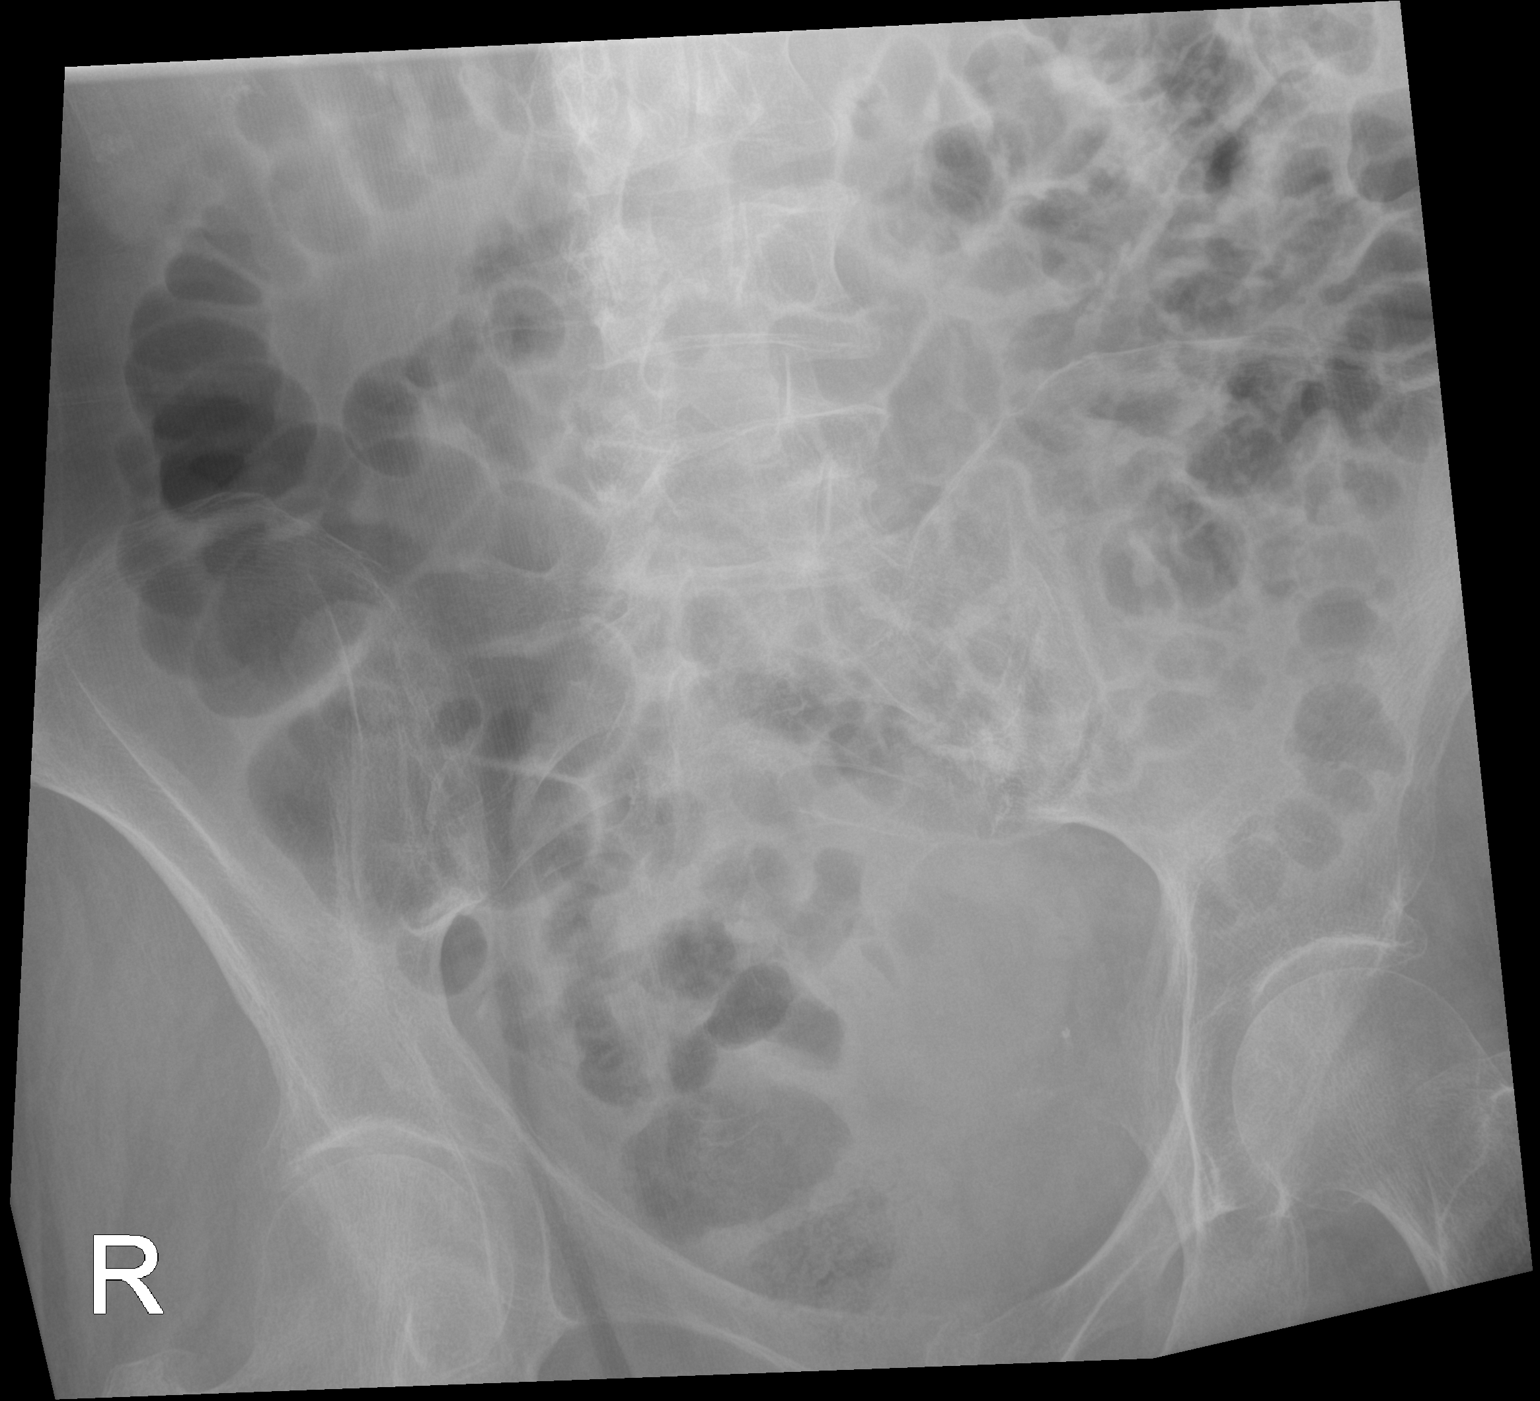

[2 of 2 positions shown; findings below may reference images not displayed]

FINDINGS: Bowel gas pattern is nonobstructive. No evidence of soft tissue mass
or abnormal fluid collection. No evidence of free intraperitoneal
air seen, although a portion of the LEFT upper quadrant is excluded.
No evidence of renal or ureteral calculi. Visualized osseous
structures are unremarkable.
IMPRESSION: No acute findings. Nonobstructive bowel gas pattern.

## 2021-09-08 ENCOUNTER — Telehealth: Payer: Self-pay

## 2021-09-08 NOTE — Telephone Encounter (Signed)
Patient and daughter req to change with sappt to a video visit. I have explained and they expressed understanding that if her CT shows changes or progression, they will need to come in office for a more detailed visit.

## 2021-09-11 DIAGNOSIS — I2510 Atherosclerotic heart disease of native coronary artery without angina pectoris: Secondary | ICD-10-CM | POA: Diagnosis not present

## 2021-09-11 DIAGNOSIS — I110 Hypertensive heart disease with heart failure: Secondary | ICD-10-CM | POA: Diagnosis not present

## 2021-09-11 DIAGNOSIS — I420 Dilated cardiomyopathy: Secondary | ICD-10-CM | POA: Diagnosis not present

## 2021-09-11 DIAGNOSIS — I5041 Acute combined systolic (congestive) and diastolic (congestive) heart failure: Secondary | ICD-10-CM | POA: Diagnosis not present

## 2021-09-11 DIAGNOSIS — C3411 Malignant neoplasm of upper lobe, right bronchus or lung: Secondary | ICD-10-CM | POA: Diagnosis not present

## 2021-09-11 DIAGNOSIS — I480 Paroxysmal atrial fibrillation: Secondary | ICD-10-CM | POA: Diagnosis not present

## 2021-09-11 DIAGNOSIS — C7931 Secondary malignant neoplasm of brain: Secondary | ICD-10-CM | POA: Diagnosis not present

## 2021-09-11 DIAGNOSIS — I471 Supraventricular tachycardia: Secondary | ICD-10-CM | POA: Diagnosis not present

## 2021-09-11 DIAGNOSIS — D630 Anemia in neoplastic disease: Secondary | ICD-10-CM | POA: Diagnosis not present

## 2021-09-12 ENCOUNTER — Other Ambulatory Visit: Payer: Self-pay

## 2021-09-12 ENCOUNTER — Inpatient Hospital Stay: Payer: Medicare PPO | Attending: Internal Medicine

## 2021-09-12 ENCOUNTER — Ambulatory Visit (HOSPITAL_COMMUNITY)
Admission: RE | Admit: 2021-09-12 | Discharge: 2021-09-12 | Disposition: A | Discharge status: Home / Self Care | Payer: Medicare PPO | Source: Ambulatory Visit | Attending: Internal Medicine | Admitting: Internal Medicine

## 2021-09-12 DIAGNOSIS — R911 Solitary pulmonary nodule: Secondary | ICD-10-CM | POA: Diagnosis not present

## 2021-09-12 DIAGNOSIS — Z79899 Other long term (current) drug therapy: Secondary | ICD-10-CM | POA: Diagnosis not present

## 2021-09-12 DIAGNOSIS — C3490 Malignant neoplasm of unspecified part of unspecified bronchus or lung: Secondary | ICD-10-CM

## 2021-09-12 DIAGNOSIS — C7931 Secondary malignant neoplasm of brain: Secondary | ICD-10-CM | POA: Insufficient documentation

## 2021-09-12 DIAGNOSIS — C3432 Malignant neoplasm of lower lobe, left bronchus or lung: Secondary | ICD-10-CM | POA: Diagnosis not present

## 2021-09-12 DIAGNOSIS — R197 Diarrhea, unspecified: Secondary | ICD-10-CM | POA: Insufficient documentation

## 2021-09-12 DIAGNOSIS — J90 Pleural effusion, not elsewhere classified: Secondary | ICD-10-CM | POA: Diagnosis not present

## 2021-09-12 DIAGNOSIS — N1330 Unspecified hydronephrosis: Secondary | ICD-10-CM | POA: Diagnosis not present

## 2021-09-12 DIAGNOSIS — C779 Secondary and unspecified malignant neoplasm of lymph node, unspecified: Secondary | ICD-10-CM | POA: Insufficient documentation

## 2021-09-12 DIAGNOSIS — J181 Lobar pneumonia, unspecified organism: Secondary | ICD-10-CM | POA: Diagnosis not present

## 2021-09-12 DIAGNOSIS — D259 Leiomyoma of uterus, unspecified: Secondary | ICD-10-CM | POA: Diagnosis not present

## 2021-09-12 DIAGNOSIS — J8410 Pulmonary fibrosis, unspecified: Secondary | ICD-10-CM | POA: Diagnosis not present

## 2021-09-12 LAB — CMP (CANCER CENTER ONLY)
ALT: 11 U/L (ref 0–44)
AST: 12 U/L — ABNORMAL LOW (ref 15–41)
Albumin: 4.1 g/dL (ref 3.5–5.0)
Alkaline Phosphatase: 112 U/L (ref 38–126)
Anion gap: 9 (ref 5–15)
BUN: 21 mg/dL (ref 8–23)
CO2: 25 mmol/L (ref 22–32)
Calcium: 9.8 mg/dL (ref 8.9–10.3)
Chloride: 108 mmol/L (ref 98–111)
Creatinine: 0.94 mg/dL (ref 0.44–1.00)
GFR, Estimated: >60 mL/min (ref >60)
Glucose, Bld: 101 mg/dL — ABNORMAL HIGH (ref 70–99)
Potassium: 3.8 mmol/L (ref 3.5–5.1)
Sodium: 142 mmol/L (ref 135–145)
Total Bilirubin: 0.7 mg/dL (ref 0.3–1.2)
Total Protein: 7.4 g/dL (ref 6.5–8.1)

## 2021-09-12 LAB — CBC WITH DIFFERENTIAL (CANCER CENTER ONLY)
Abs Immature Granulocytes: 0.01 10*3/uL (ref 0.00–0.07)
Basophils Absolute: 0 10*3/uL (ref 0.0–0.1)
Basophils Relative: 0 %
Eosinophils Absolute: 0.2 10*3/uL (ref 0.0–0.5)
Eosinophils Relative: 2 %
HCT: 40 % (ref 36.0–46.0)
Hemoglobin: 12.1 g/dL (ref 12.0–15.0)
Immature Granulocytes: 0 %
Lymphocytes Relative: 17 %
Lymphs Abs: 1.3 10*3/uL (ref 0.7–4.0)
MCH: 28.2 pg (ref 26.0–34.0)
MCHC: 30.3 g/dL (ref 30.0–36.0)
MCV: 93.2 fL (ref 80.0–100.0)
Monocytes Absolute: 0.5 10*3/uL (ref 0.1–1.0)
Monocytes Relative: 6 %
Neutro Abs: 5.8 10*3/uL (ref 1.7–7.7)
Neutrophils Relative %: 75 %
Platelet Count: 342 10*3/uL (ref 150–400)
RBC: 4.29 MIL/uL (ref 3.87–5.11)
RDW: 14.8 % (ref 11.5–15.5)
WBC Count: 7.7 10*3/uL (ref 4.0–10.5)
nRBC: 0 % (ref 0.0–0.2)

## 2021-09-12 MED ORDER — IOHEXOL 9 MG/ML PO SOLN: 1000.0000 mL | ORAL | Status: AC

## 2021-09-12 MED ORDER — IOHEXOL 300 MG/ML  SOLN
100.0000 mL | Freq: Once | INTRAMUSCULAR | Status: AC | PRN
  Administered 2021-09-12: 13:00:00 100 mL via INTRAVENOUS

## 2021-09-12 MED ORDER — SODIUM CHLORIDE (PF) 0.9 % IJ SOLN
INTRAMUSCULAR | Status: AC
  Filled 2021-09-12: qty 50

## 2021-09-12 MED ORDER — IOHEXOL 9 MG/ML PO SOLN
ORAL | Status: AC
  Administered 2021-09-12: 11:00:00 1000 mL via ORAL
  Filled 2021-09-12: qty 1000

## 2021-09-13 ENCOUNTER — Telehealth: Payer: Self-pay | Admitting: Internal Medicine

## 2021-09-13 ENCOUNTER — Other Ambulatory Visit (HOSPITAL_COMMUNITY): Payer: Self-pay

## 2021-09-13 DIAGNOSIS — I110 Hypertensive heart disease with heart failure: Secondary | ICD-10-CM | POA: Diagnosis not present

## 2021-09-13 DIAGNOSIS — D630 Anemia in neoplastic disease: Secondary | ICD-10-CM | POA: Diagnosis not present

## 2021-09-13 DIAGNOSIS — I5041 Acute combined systolic (congestive) and diastolic (congestive) heart failure: Secondary | ICD-10-CM | POA: Diagnosis not present

## 2021-09-13 DIAGNOSIS — I471 Supraventricular tachycardia: Secondary | ICD-10-CM | POA: Diagnosis not present

## 2021-09-13 DIAGNOSIS — I420 Dilated cardiomyopathy: Secondary | ICD-10-CM | POA: Diagnosis not present

## 2021-09-13 DIAGNOSIS — C3411 Malignant neoplasm of upper lobe, right bronchus or lung: Secondary | ICD-10-CM | POA: Diagnosis not present

## 2021-09-13 DIAGNOSIS — I2510 Atherosclerotic heart disease of native coronary artery without angina pectoris: Secondary | ICD-10-CM | POA: Diagnosis not present

## 2021-09-13 DIAGNOSIS — I480 Paroxysmal atrial fibrillation: Secondary | ICD-10-CM | POA: Diagnosis not present

## 2021-09-13 DIAGNOSIS — C7931 Secondary malignant neoplasm of brain: Secondary | ICD-10-CM | POA: Diagnosis not present

## 2021-09-13 NOTE — Telephone Encounter (Signed)
I spoke with patients Daughter Ned Card) to schedule AWV.  She stated patient is at [facility] and they are using providers there.

## 2021-09-13 NOTE — Telephone Encounter (Signed)
Left message for patient to call back and schedule Medicare Annual Wellness Visit (AWV) either virtually or in office.  Left my Herbie Drape number [phone] a  Last AWV 09/14/20 please schedule at anytime with [facility]    This should be a 45 minute visit.

## 2021-09-14 DIAGNOSIS — C3411 Malignant neoplasm of upper lobe, right bronchus or lung: Secondary | ICD-10-CM | POA: Diagnosis not present

## 2021-09-14 DIAGNOSIS — I2510 Atherosclerotic heart disease of native coronary artery without angina pectoris: Secondary | ICD-10-CM | POA: Diagnosis not present

## 2021-09-14 DIAGNOSIS — C7931 Secondary malignant neoplasm of brain: Secondary | ICD-10-CM | POA: Diagnosis not present

## 2021-09-14 DIAGNOSIS — I480 Paroxysmal atrial fibrillation: Secondary | ICD-10-CM | POA: Diagnosis not present

## 2021-09-14 DIAGNOSIS — I5041 Acute combined systolic (congestive) and diastolic (congestive) heart failure: Secondary | ICD-10-CM | POA: Diagnosis not present

## 2021-09-14 DIAGNOSIS — I420 Dilated cardiomyopathy: Secondary | ICD-10-CM | POA: Diagnosis not present

## 2021-09-14 DIAGNOSIS — I110 Hypertensive heart disease with heart failure: Secondary | ICD-10-CM | POA: Diagnosis not present

## 2021-09-14 DIAGNOSIS — I471 Supraventricular tachycardia: Secondary | ICD-10-CM | POA: Diagnosis not present

## 2021-09-14 DIAGNOSIS — D630 Anemia in neoplastic disease: Secondary | ICD-10-CM | POA: Diagnosis not present

## 2021-09-18 ENCOUNTER — Inpatient Hospital Stay: Payer: Medicare PPO | Admitting: Internal Medicine

## 2021-09-18 ENCOUNTER — Other Ambulatory Visit: Payer: Self-pay

## 2021-09-18 DIAGNOSIS — C3411 Malignant neoplasm of upper lobe, right bronchus or lung: Secondary | ICD-10-CM | POA: Diagnosis not present

## 2021-09-18 DIAGNOSIS — F331 Major depressive disorder, recurrent, moderate: Secondary | ICD-10-CM | POA: Diagnosis not present

## 2021-09-18 DIAGNOSIS — C7931 Secondary malignant neoplasm of brain: Secondary | ICD-10-CM | POA: Diagnosis not present

## 2021-09-18 DIAGNOSIS — Z79899 Other long term (current) drug therapy: Secondary | ICD-10-CM | POA: Diagnosis not present

## 2021-09-18 DIAGNOSIS — C3432 Malignant neoplasm of lower lobe, left bronchus or lung: Secondary | ICD-10-CM | POA: Diagnosis not present

## 2021-09-18 DIAGNOSIS — R197 Diarrhea, unspecified: Secondary | ICD-10-CM | POA: Diagnosis not present

## 2021-09-18 DIAGNOSIS — Z5111 Encounter for antineoplastic chemotherapy: Secondary | ICD-10-CM

## 2021-09-18 NOTE — Progress Notes (Signed)
[place] [phone]   [phone]  PROGRESS NOTE FOR TELEMEDICINE VISITS  No primary care provider on file. No primary provider on file.  I connected withNAME@ on 09/18/21 at  2:00 PM EST by video enabled telemedicine visit and verified that I am speaking with the correct person using two identifiers.   I discussed the limitations, risks, security and privacy concerns of performing an evaluation and management service by telemedicine and the availability of in-person appointments. I also discussed with the patient that there may be a patient responsible charge related to this service. The patient expressed understanding and agreed to proceed.  Other persons participating in the visit and their role in the encounter: Her daughter  Patient's location: Assisted living facility Provider's location: [place].  PRINCIPAL DIAGNOSIS:  metastatic non-small cell lung cancer, adenocarcinoma with brain metastases initially diagnosed as a stage IIb in October 2002.    BIOMARKERS TESTING ([facility]): Positive for: EGFR [id number] del, T790M, CTNNB1 D32G, SMAD4 T125fs*10 Negative for: RET, ALK, BRAF, KRAS, ERBB2 and MET   PRIOR THERAPY:  Status post left lower lobectomy under the care of Dr Arlyce Dice in October 2002. Status post course of concurrent chemoradiation with weekly carboplatin and paclitaxel under the care of Dr Benay Spice and Dr Elba Barman in early 2003. Status post treatment with Celebrex and Iressa according to the clinical trial at [facility] for a total of 1 year. Status post left occipital craniotomy for tumor resection on September 21, 2005, under the care of Dr Christella Noa. Status post whole brain irradiation under the care of Dr Elba Barman, completed November 07, 2005. Status post gamma knife treatment to the resected cavitary recurrence on June 02, 2008, at [facility]. Status post right thyroidectomy with radical neck  dissection under the care of Dr. Constance Holster on 10/18/2011. Status post excisional biopsy of right cervical lymph node that was consistent with metastatic adenocarcinoma. Tarceva 150 mg by mouth daily with therapy beginning 06/03/2007, discontinued in July 2015 secondary to disease progression. Status post 79 cycles. palliative radiotherapy to the enlarging right upper lobe lung nodule under the care of Dr. Valere Dross. Treatment at [facility] on STUDY [id number]  300 mg by mouth daily for 21 days discontinued secondary to study deviation and inability for the patient to keep her appointment at [facility] secondary to recent hip fractures. Open reduction and internal fixation of trimalleolar ankle fracture dislocation with fixation of the fibula as well as medial malleolus. Tagrisso 80 mg by mouth daily started 11/25/2014, status post 62 months of treatment.  This was discontinued on 02/13/2020 secondary to congestive heart failure with ejection fraction of 20-25%.     CURRENT THERAPY: Tarceva 150 mg p.o. daily.  Started 02/26/2020.  This will be reduced to Tarceva 100 mg p.o. daily starting July 01, 2020.  Status post 14 months of treatment.  Discontinued and January 2023 based on The patient's request  INTERVAL HISTORY: Valerie [name] 76 y.o. female has a MyChart virtual video visit with me today for evaluation and discussion of her scan results.  The patient lost her husband in October 2022.  She is currently on an assisted living facility.  She continues on her treatment with Tarceva 100 mg p.o. daily and has been tolerating it well except for the frequent episodes of diarrhea with some skin breaks and occasional bleeding.  She denied having any current chest pain, shortness of breath, cough or hemoptysis.  She denied having  any nausea, vomiting, abdominal pain or constipation.  She has no headache or visual changes.  She had repeat CT scan of the chest, abdomen pelvis performed  recently and we are having the visit for evaluation and discussion of her risk her results.  MEDICAL HISTORY: Past Medical History:  Diagnosis Date   Abnormal Pap smear 2006   Anal fistula    Ankle fracture    Anxiety    Arthritis    Atrophic vaginitis 2008   Cataract    Dementia ([place]) 2009   Dyspareunia 2008   H/O osteoporosis    H/O varicella    H/O vitamin D deficiency    Headache 07/24/2016   Heart murmur    History of measles, mumps, or rubella    History of radiation therapy 07/28/13- 08/10/13   right lung metastasis 5000 cGy 10 sessions   Hypertension    Lung cancer ([place]) dx'd 2002   Lung cancer ([place])    Lung cancer ([place])    Lung metastasis ([place])    PET scan 05/05/13, RUL lung nodule   Metastasis to brain [facility]) dx'd 2008   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   Nodule of right lung CT- 06/03/12   RIGHT UPPER LOBE   Nodule of right lung 06/03/12   Upper Lobe   On antineoplastic chemotherapy    TARCEVA   Osteoporosis 2010   Primary cancer of right upper lobe of lung ([place]) 04/22/2009   Qualifier: Diagnosis of  By: Nils Pyle CMA (AAMA), Leisha     Shortness of breath    hx lung ca    Status post chemotherapy 2003   CARBOPLATIN/PACLITAXEL /STATUS POST CLINICAL TRAIL OF CELEBREX AND IRESSA AT [facility] FOR 1 YEAR   Status post radiation therapy 2003   LEFT LUNG   Status post radiation therapy 11/07/2005   WHOLE BRAIN: DR Larkin In[facility]   Status post radiation therapy 06/02/2008   GAMMA KNIFE OF RESECTED CAVITAY   Thyroid adenoma    ?   Thyroid cancer ([place]) 10/18/11 bx   adenoid nodules    Yeast infection     ALLERGIES:  is allergic to sulfa antibiotics.  MEDICATIONS:  Current Outpatient Medications  Medication Sig Dispense Refill   Ascorbic Acid (VITAMIN C) 100 MG tablet Take 100 mg by mouth every evening. Gummie     cephALEXin (KEFLEX) 250 MG capsule Take 1 capsule (250 mg total) by mouth 4 (four) times daily. 28 capsule 0   cetaphil (CETAPHIL) lotion Apply 1  application topically daily as needed for dry skin.     Cholecalciferol (D3 PO) Take 1 capsule by mouth daily.     donepezil (ARICEPT) 10 MG tablet TAKE 1 TABLET BY MOUTH ONCE DAILY IN THE EVENING (Patient taking differently: Take 10 mg by mouth at bedtime.) 90 tablet 0   erlotinib (TARCEVA) 100 MG tablet TAKE 1 TABLET (100 MG TOTAL) BY MOUTH DAILY. TAKE ON AN EMPTY STOMACH 1 HOUR BEFORE A MEAL OR 2 HOURS AFTER. 30 tablet 2   loperamide (IMODIUM A-D) 2 MG tablet Take 4 mg by mouth 3 (three) times daily as needed for diarrhea or loose stools.     MAGNESIUM PO Take 200 mg by mouth daily. (Patient not taking: Reported on 07/18/2021)     Multiple Vitamin (MULTI-VITAMINS) TABS Take 1 tablet by mouth every evening.      prochlorperazine (COMPAZINE) 10 MG tablet Take 1 tablet (10 mg total) by mouth every 6 (six) hours as needed for nausea  or vomiting. 30 tablet 0   vitamin B-12 (CYANOCOBALAMIN) 1000 MCG tablet Take 1,000 mcg by mouth every evening.     No current facility-administered medications for this visit.    SURGICAL HISTORY:  Past Surgical History:  Procedure Laterality Date   BRAIN SURGERY     INTRAVASCULAR ULTRASOUND/IVUS N/A 02/16/2020   Procedure: Intravascular Ultrasound/IVUS;  Surgeon: Leonie Man, MD;  Location: [place] [facility];  Service: Cardiovascular;  Laterality: N/A;   LEFT LOWER LOBECTOMY  05/2001   LEFT OCCIPITAL CRANIOTOMY  09/21/2005   tumor   LUNG LOBECTOMY     NECK SURGERY     ORIF ANKLE FRACTURE Left 10/02/2014   Procedure: OPEN REDUCTION INTERNAL FIXATION (ORIF) ANKLE FRACTURE;  Surgeon: Alta Corning, MD;  Location: [facility];  Service: Orthopedics;  Laterality: Left;   ORIF FEMUR FRACTURE Right 06/11/2019   Procedure: OPEN REDUCTION INTERNAL FIXATION (ORIF) DISTAL FEMUR FRACTURE;  Surgeon: Renette Butters, MD;  Location: [place];  Service: Orthopedics;  Laterality: Right;   ORIF TIBIA PLATEAU Right 06/11/2019   Procedure: Open Reduction Internal Fixation (Orif)  Tibial Plateau;  Surgeon: Renette Butters, MD;  Location: [place];  Service: Orthopedics;  Laterality: Right;   RADICAL NECK DISSECTION  10/18/2011   Procedure: RADICAL NECK DISSECTION;  Surgeon: Izora Gala, MD;  Location: [place];  Service: ENT;  Laterality: Right;  RIGHT MODIFIED NECK DISSECTION /POSSIBLE RIGHT THYROIDECTOM   RIGHT/LEFT HEART CATH AND CORONARY ANGIOGRAPHY N/A 02/16/2020   Procedure: RIGHT/LEFT HEART CATH AND CORONARY ANGIOGRAPHY;  Surgeon: Leonie Man, MD;  Location: [place] [facility];  Service: Cardiovascular;  Laterality: N/A;   TEE WITHOUT CARDIOVERSION N/A 03/28/2020   Procedure: TRANSESOPHAGEAL ECHOCARDIOGRAM (TEE);  Surgeon: Buford Dresser, MD;  Location: [place];  Service: Cardiovascular;  Laterality: N/A;   THYROIDECTOMY  10/18/2011   Procedure: THYROIDECTOMY WITH RADICAL NECK DISSECTION;  Surgeon: Izora Gala, MD;  Location: [place];  Service: ENT;  Laterality: Right;    REVIEW OF SYSTEMS:  A comprehensive review of systems was negative except for: Constitutional: positive for fatigue Gastrointestinal: positive for diarrhea     LABORATORY DATA: Lab Results  Component Value Date   WBC 7.7 09/12/2021   HGB 12.1 09/12/2021   HCT 40.0 09/12/2021   MCV 93.2 09/12/2021   PLT 342 09/12/2021      Chemistry      Component Value Date/Time   NA 142 09/12/2021 1140   NA 140 07/18/2021 1421   NA 141 08/06/2017 1047   K 3.8 09/12/2021 1140   K 3.9 08/06/2017 1047   CL 108 09/12/2021 1140   CL 106 12/09/2012 0806   CO2 25 09/12/2021 1140   CO2 24 08/06/2017 1047   BUN 21 09/12/2021 1140   BUN 19 07/18/2021 1421   BUN 15.4 08/06/2017 1047   CREATININE 0.94 09/12/2021 1140   CREATININE 1.0 08/06/2017 1047   GLU 89 04/20/2020 0000      Component Value Date/Time   CALCIUM 9.8 09/12/2021 1140   CALCIUM 9.8 08/06/2017 1047   ALKPHOS 112 09/12/2021 1140   ALKPHOS 65 08/06/2017 1047   AST 12 (L) 09/12/2021 1140   AST 11 08/06/2017 1047   ALT 11  09/12/2021 1140   ALT 9 08/06/2017 1047   BILITOT 0.7 09/12/2021 1140   BILITOT 0.66 08/06/2017 1047       RADIOGRAPHIC STUDIES: DG Chest 2 View  Result Date: 08/22/2021 CLINICAL DATA:  Wheezing. EXAM: CHEST - 2 VIEW COMPARISON:  Chest x-ray 06/03/2020.  Chest CT 02/23/2021. FINDINGS: There is moderate elevation of the left hemidiaphragm similar to the prior study. The cardiomediastinal silhouette is grossly within normal limits given degree of patient rotation. There is no new focal lung infiltrate or pneumothorax identified. Small left pleural effusion is unchanged. There is some linear scarring in the superior segment of the left lower lobe seen best on the lateral view similar to the prior CT. The right lung is grossly clear. Healed left rib fractures are again noted. IMPRESSION: 1. Stable examination. Small left pleural effusion with linear scarring in the superior segment of the left lower lobe. Electronically Signed   By: Ronney Asters M.D.   On: 08/22/2021 16:40   CT Chest W Contrast  Result Date: 09/13/2021 CLINICAL DATA:  Non-small cell lung cancer staging, status post left lower lobectomy, radiation therapy complete, ongoing chemotherapy EXAM: CT CHEST, ABDOMEN, AND PELVIS WITH CONTRAST TECHNIQUE: Multidetector CT imaging of the chest, abdomen and pelvis was performed following the standard protocol during bolus administration of intravenous contrast. RADIATION DOSE REDUCTION: This exam was performed according to the departmental dose-optimization program which includes automated exposure control, adjustment of the mA and/or kV according to patient size and/or use of iterative reconstruction technique. CONTRAST:  171mL OMNIPAQUE IOHEXOL 300 MG/ML SOLN, additional oral enteric contrast COMPARISON:  CT abdomen pelvis, 04/17/2021, CT chest abdomen pelvis, 02/23/2021 FINDINGS: CT CHEST FINDINGS Cardiovascular: Aortic atherosclerosis. Normal heart size. Three-vessel coronary artery  calcifications. No pericardial effusion. Mediastinum/Nodes: No enlarged mediastinal, hilar, or axillary lymph nodes. Thyroid gland, trachea, and esophagus demonstrate no significant findings. Lungs/Pleura: Redemonstrated postoperative findings status post left lower lobectomy with perihilar and paramedian radiation fibrosis and consolidation of the left lung, as well as peribronchovascular consolidation and fibrosis of the central perihilar right upper lobe (series 7, image 59). Small, loculated left pleural effusion. Stable, benign 0.4 cm nodule of the peripheral right upper lobe (series 7, image 56). Musculoskeletal: No chest wall mass or suspicious osseous lesions identified. CT ABDOMEN PELVIS FINDINGS Hepatobiliary: No solid liver abnormality is seen. No gallstones, gallbladder wall thickening, or biliary dilatation. Pancreas: Unremarkable. No pancreatic ductal dilatation or surrounding inflammatory changes. Spleen: Normal in size without significant abnormality. Adrenals/Urinary Tract: Adrenal glands are unremarkable. 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 The right kidney is normal, without renal calculi, solid lesion, or hydronephrosis. Bladder is unremarkable. Stomach/Bowel: Stomach is within normal limits. Appendix appears normal. No evidence of bowel wall thickening, distention, or inflammatory changes. Vascular/Lymphatic: Aortic atherosclerosis. No enlarged abdominal or pelvic lymph nodes. Reproductive: Uterine fibroids. Other: No abdominal wall hernia or abnormality. No ascites. Musculoskeletal: No acute osseous findings. Unchanged, chronic superior endplate and wedge deformities of the T10, L1, L4, and L5 vertebral bodies (series 6, image 99). IMPRESSION: 1. Redemonstrated postoperative findings status post left lower lobectomy with perihilar and  paramedian consolidation and radiation fibrosis of the left lung, as well as peribronchovascular consolidation and radiation fibrosis of the central perihilar right upper lobe. No evidence of recurrent malignancy in the chest. The presence of residual or recurrent metabolically active malignancy within areas of fibrosis may be more sensitively assessed by PET-CT if desired. 2. Small, loculated left pleural effusion. 3. No evidence of metastatic disease in the abdomen or pelvis. 4. 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 Previously seen right-sided hydronephrosis is resolved. 5. Unchanged thoracic and lumbar wedge and superior endplate deformities. 6. Coronary artery disease. Aortic Atherosclerosis (ICD10-I70.0). Electronically Signed   By: Delanna Ahmadi M.D.   On: 09/13/2021 13:16   CT Abdomen Pelvis W Contrast  Result Date: 09/13/2021 CLINICAL DATA:  Non-small cell lung cancer staging, status post left lower lobectomy, radiation therapy complete, ongoing chemotherapy EXAM: CT CHEST, ABDOMEN, AND PELVIS WITH CONTRAST TECHNIQUE: Multidetector CT imaging of the chest, abdomen and pelvis was performed following the standard protocol during bolus administration of intravenous contrast. RADIATION DOSE REDUCTION: This exam was performed according to the departmental dose-optimization program which includes automated exposure control, adjustment of the mA and/or kV according to patient size and/or use of iterative reconstruction technique. CONTRAST:  184mL OMNIPAQUE IOHEXOL 300 MG/ML SOLN, additional oral enteric contrast COMPARISON:  CT abdomen pelvis, 04/17/2021, CT chest abdomen pelvis, 02/23/2021 FINDINGS: CT CHEST FINDINGS Cardiovascular: Aortic atherosclerosis. Normal heart size. Three-vessel coronary artery calcifications. No pericardial effusion.  Mediastinum/Nodes: No enlarged mediastinal, hilar, or axillary lymph nodes. Thyroid gland, trachea, and esophagus demonstrate no significant findings. Lungs/Pleura: Redemonstrated postoperative findings status post left lower lobectomy with perihilar and paramedian radiation fibrosis and consolidation of the left lung, as well as peribronchovascular consolidation and fibrosis of the central perihilar right upper lobe (series 7, image 59). Small, loculated left pleural effusion. Stable, benign 0.4 cm nodule of the peripheral right upper lobe (series 7, image 56). Musculoskeletal: No chest wall mass or suspicious osseous lesions identified. CT ABDOMEN PELVIS FINDINGS Hepatobiliary: No solid liver abnormality is seen. No gallstones, gallbladder wall thickening, or biliary dilatation. Pancreas: Unremarkable. No pancreatic ductal dilatation or surrounding inflammatory changes. Spleen: Normal in size without significant abnormality. Adrenals/Urinary Tract: Adrenal glands are unremarkable. 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 The right kidney is normal, without renal calculi, solid lesion, or hydronephrosis. Bladder is unremarkable. Stomach/Bowel: Stomach is within normal limits. Appendix appears normal. No evidence of bowel wall thickening, distention, or inflammatory changes. Vascular/Lymphatic: Aortic atherosclerosis. No enlarged abdominal or pelvic lymph nodes. Reproductive: Uterine fibroids. Other: No abdominal wall hernia or abnormality. No ascites. Musculoskeletal: No acute osseous findings. Unchanged, chronic superior endplate and wedge deformities of the T10, L1, L4, and L5 vertebral bodies (series 6, image 99). IMPRESSION: 1. Redemonstrated postoperative findings status post left lower lobectomy with perihilar and paramedian consolidation and radiation  fibrosis of the left lung, as well as peribronchovascular consolidation and radiation fibrosis of the central perihilar right upper lobe. No evidence of recurrent malignancy in the chest. The presence of residual or recurrent metabolically active malignancy within areas of fibrosis may be more sensitively assessed by PET-CT if desired. 2. Small, loculated left pleural effusion. 3. No evidence of metastatic disease in the abdomen or pelvis. 4. 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 Previously seen right-sided hydronephrosis is resolved. 5. Unchanged thoracic and lumbar wedge and superior endplate deformities. 6. Coronary artery disease. Aortic Atherosclerosis (ICD10-I70.0). Electronically Signed   By: Delanna Ahmadi M.D.   On: 09/13/2021 13:16    ASSESSMENT AND PLAN: This is a very pleasant 76 years old African-American female with metastatic non-small cell lung cancer, adenocarcinoma with positive EGFR mutation status post several years of treatment with Tarceva and the patient developed positive resistant mutation, T790M. She has  been on treatment with Tagrisso for the last 5 years, status post 62 months of treatment.  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  This is likely secondary to her treatment with Tagrisso. I discontinued her treatment with Tagrisso on 02/13/2020. The patient is currently on treatment with Tarceva 150 mg p.o. daily started 4 months ago with some interruption because of her hospitalization. The patient resumed her treatment with Tarceva at a dose of 100 mg p.o. daily status post 14 months of treatment.  The patient has been  tolerating this treatment well except for the frequent episodes of diarrhea.  She is currently in assisted living facility and after the loss of her husband she is declining more and thinking more of comfort care. She had repeat CT scan of the chest, abdomen pelvis performed recently.  I personally and independently reviewed the scan and discussed the results with the patient and her daughter. I recommended for the patient to continue her treatment with Tarceva with the same dose and use Imodium for the diarrhea but the patient and her daughter are more interested in comfort care at this point and she would like to discontinue her treatment with Tarceva at this point. I agreed with their plan and will continue to monitor her on as-needed basis. She may benefit from referral to palliative care on hospice but she will call us if she decided to go with this route. I would not consider repeating imaging study in the future since she is interested mainly on comfort care at this point. She was advised to call if she has any other concerning symptoms in the interval. I discussed the assessment and treatment plan with the patient. The patient was provided an opportunity to ask questions and all were answered. The patient agreed with the plan and demonstrated an understanding of the instructions.   The patient was advised to call back or seek an in-person evaluation if the symptoms worsen or if the condition fails to improve as anticipated.  I provided 15 minutes of face-to-face video visit time during this encounter, and > 50% was spent counseling as documented under my assessment & plan.  Eilleen Kempf, MD 09/18/2021 1:56 PM  Disclaimer: This note was dictated with voice recognition software. Similar sounding words can inadvertently be transcribed and may not be corrected upon review.

## 2021-09-20 ENCOUNTER — Emergency Department (HOSPITAL_COMMUNITY)
Admission: EM | Admit: 2021-09-20 | Discharge: 2021-09-21 | Disposition: A | Discharge status: Home / Self Care | Payer: Medicare PPO | Attending: Emergency Medicine | Admitting: Emergency Medicine

## 2021-09-20 ENCOUNTER — Ambulatory Visit: Payer: Medicare PPO | Admitting: Internal Medicine

## 2021-09-20 ENCOUNTER — Encounter (HOSPITAL_COMMUNITY): Payer: Self-pay | Admitting: Emergency Medicine

## 2021-09-20 ENCOUNTER — Emergency Department (HOSPITAL_COMMUNITY): Payer: Medicare PPO

## 2021-09-20 ENCOUNTER — Ambulatory Visit: Payer: Medicare PPO

## 2021-09-20 DIAGNOSIS — I110 Hypertensive heart disease with heart failure: Secondary | ICD-10-CM | POA: Diagnosis not present

## 2021-09-20 DIAGNOSIS — R300 Dysuria: Secondary | ICD-10-CM | POA: Diagnosis not present

## 2021-09-20 DIAGNOSIS — N390 Urinary tract infection, site not specified: Secondary | ICD-10-CM | POA: Diagnosis not present

## 2021-09-20 DIAGNOSIS — I471 Supraventricular tachycardia: Secondary | ICD-10-CM | POA: Diagnosis not present

## 2021-09-20 DIAGNOSIS — C3411 Malignant neoplasm of upper lobe, right bronchus or lung: Secondary | ICD-10-CM | POA: Diagnosis not present

## 2021-09-20 DIAGNOSIS — C7931 Secondary malignant neoplasm of brain: Secondary | ICD-10-CM | POA: Diagnosis not present

## 2021-09-20 DIAGNOSIS — N200 Calculus of kidney: Secondary | ICD-10-CM | POA: Diagnosis not present

## 2021-09-20 DIAGNOSIS — I480 Paroxysmal atrial fibrillation: Secondary | ICD-10-CM | POA: Diagnosis not present

## 2021-09-20 DIAGNOSIS — Z85118 Personal history of other malignant neoplasm of bronchus and lung: Secondary | ICD-10-CM | POA: Insufficient documentation

## 2021-09-20 DIAGNOSIS — R531 Weakness: Secondary | ICD-10-CM | POA: Diagnosis not present

## 2021-09-20 DIAGNOSIS — I2510 Atherosclerotic heart disease of native coronary artery without angina pectoris: Secondary | ICD-10-CM | POA: Diagnosis not present

## 2021-09-20 DIAGNOSIS — L89151 Pressure ulcer of sacral region, stage 1: Secondary | ICD-10-CM | POA: Insufficient documentation

## 2021-09-20 DIAGNOSIS — I5041 Acute combined systolic (congestive) and diastolic (congestive) heart failure: Secondary | ICD-10-CM | POA: Diagnosis not present

## 2021-09-20 DIAGNOSIS — L89159 Pressure ulcer of sacral region, unspecified stage: Secondary | ICD-10-CM | POA: Diagnosis not present

## 2021-09-20 DIAGNOSIS — D630 Anemia in neoplastic disease: Secondary | ICD-10-CM | POA: Diagnosis not present

## 2021-09-20 DIAGNOSIS — L8995 Pressure ulcer of unspecified site, unstageable: Secondary | ICD-10-CM | POA: Diagnosis not present

## 2021-09-20 DIAGNOSIS — N210 Calculus in bladder: Secondary | ICD-10-CM | POA: Diagnosis not present

## 2021-09-20 DIAGNOSIS — I420 Dilated cardiomyopathy: Secondary | ICD-10-CM | POA: Diagnosis not present

## 2021-09-20 LAB — CBC WITH DIFFERENTIAL/PLATELET
Abs Immature Granulocytes: 0.01 10*3/uL (ref 0.00–0.07)
Basophils Absolute: 0 10*3/uL (ref 0.0–0.1)
Basophils Relative: 0 %
Eosinophils Absolute: 0.2 10*3/uL (ref 0.0–0.5)
Eosinophils Relative: 3 %
HCT: 39.1 % (ref 36.0–46.0)
Hemoglobin: 12.1 g/dL (ref 12.0–15.0)
Immature Granulocytes: 0 %
Lymphocytes Relative: 16 %
Lymphs Abs: 1 10*3/uL (ref 0.7–4.0)
MCH: 28.6 pg (ref 26.0–34.0)
MCHC: 30.9 g/dL (ref 30.0–36.0)
MCV: 92.4 fL (ref 80.0–100.0)
Monocytes Absolute: 0.4 10*3/uL (ref 0.1–1.0)
Monocytes Relative: 7 %
Neutro Abs: 4.4 10*3/uL (ref 1.7–7.7)
Neutrophils Relative %: 74 %
Platelets: 325 10*3/uL (ref 150–400)
RBC: 4.23 MIL/uL (ref 3.87–5.11)
RDW: 14.4 % (ref 11.5–15.5)
WBC: 5.9 10*3/uL (ref 4.0–10.5)
nRBC: 0 % (ref 0.0–0.2)

## 2021-09-20 LAB — URINALYSIS, ROUTINE W REFLEX MICROSCOPIC
Bilirubin Urine: NEGATIVE
Glucose, UA: NEGATIVE mg/dL
Ketones, ur: NEGATIVE mg/dL
Nitrite: POSITIVE — AB
Protein, ur: 100 mg/dL — AB
RBC / HPF: >50 RBC/hpf — ABNORMAL HIGH (ref 0–5)
Specific Gravity, Urine: 1.015 (ref 1.005–1.030)
WBC, UA: >50 WBC/hpf — ABNORMAL HIGH (ref 0–5)
pH: 8 (ref 5.0–8.0)

## 2021-09-20 LAB — COMPREHENSIVE METABOLIC PANEL
ALT: 11 U/L (ref 0–44)
AST: 13 U/L — ABNORMAL LOW (ref 15–41)
Albumin: 3.6 g/dL (ref 3.5–5.0)
Alkaline Phosphatase: 104 U/L (ref 38–126)
Anion gap: 7 (ref 5–15)
BUN: 26 mg/dL — ABNORMAL HIGH (ref 8–23)
CO2: 25 mmol/L (ref 22–32)
Calcium: 9.3 mg/dL (ref 8.9–10.3)
Chloride: 109 mmol/L (ref 98–111)
Creatinine, Ser: 1.15 mg/dL — ABNORMAL HIGH (ref 0.44–1.00)
GFR, Estimated: 49 mL/min — ABNORMAL LOW (ref >60)
Glucose, Bld: 101 mg/dL — ABNORMAL HIGH (ref 70–99)
Potassium: 3.6 mmol/L (ref 3.5–5.1)
Sodium: 141 mmol/L (ref 135–145)
Total Bilirubin: 0.7 mg/dL (ref 0.3–1.2)
Total Protein: 7.3 g/dL (ref 6.5–8.1)

## 2021-09-20 LAB — I-STAT CHEM 8, ED
BUN: 24 mg/dL — ABNORMAL HIGH (ref 8–23)
Calcium, Ion: 1.22 mmol/L (ref 1.15–1.40)
Chloride: 108 mmol/L (ref 98–111)
Creatinine, Ser: 1.1 mg/dL — ABNORMAL HIGH (ref 0.44–1.00)
Glucose, Bld: 95 mg/dL (ref 70–99)
HCT: 39 % (ref 36.0–46.0)
Hemoglobin: 13.3 g/dL (ref 12.0–15.0)
Potassium: 3.5 mmol/L (ref 3.5–5.1)
Sodium: 143 mmol/L (ref 135–145)
TCO2: 26 mmol/L (ref 22–32)

## 2021-09-20 LAB — LACTIC ACID, PLASMA: Lactic Acid, Venous: 0.8 mmol/L (ref 0.5–1.9)

## 2021-09-20 MED ORDER — MORPHINE SULFATE (PF) 4 MG/ML IV SOLN
4.0000 mg | Freq: Once | INTRAVENOUS | Status: AC
  Administered 2021-09-20: 4 mg via INTRAVENOUS
  Filled 2021-09-20: qty 1

## 2021-09-20 MED ORDER — ZINC OXIDE 40 % EX OINT
TOPICAL_OINTMENT | Freq: Once | CUTANEOUS | Status: AC
Start: 2021-09-20 — End: 2021-09-21
  Filled 2021-09-20: qty 57

## 2021-09-20 MED ORDER — PIPERACILLIN-TAZOBACTAM 3.375 G IVPB 30 MIN
3.3750 g | Freq: Once | INTRAVENOUS | Status: AC
  Administered 2021-09-20: 3.375 g via INTRAVENOUS
  Filled 2021-09-20: qty 50

## 2021-09-20 MED ORDER — CEPHALEXIN 500 MG PO CAPS: 500.0000 mg | ORAL_CAPSULE | Freq: Three times a day (TID) | ORAL | 0 refills | Status: DC

## 2021-09-20 MED ORDER — SODIUM CHLORIDE 0.9 % IV BOLUS
1000.0000 mL | Freq: Once | INTRAVENOUS | Status: AC
  Administered 2021-09-20: 1000 mL via INTRAVENOUS

## 2021-09-20 MED ORDER — IOHEXOL 300 MG/ML  SOLN
80.0000 mL | Freq: Once | INTRAMUSCULAR | Status: AC | PRN
  Administered 2021-09-20: 80 mL via INTRAVENOUS

## 2021-09-20 MED ORDER — DESITIN 13 % EX CREA: 1.0000 "application " | TOPICAL_CREAM | Freq: Two times a day (BID) | CUTANEOUS | 0 refills | Status: DC

## 2021-09-20 NOTE — ED Triage Notes (Signed)
Arrives via EMS from [place] [facility] [address], complains of pressure sore on sacral area, also reports pain with urination. Alert and oriented, normally bed bound.   Vitals  144/78 P 95 99 RA

## 2021-09-20 NOTE — ED Notes (Signed)
Pt transported to CT ?

## 2021-09-20 NOTE — Discharge Instructions (Addendum)
You have pressure ulcer.  You need to apply barrier cream (Desitin) twice daily to keep the area dry  You also have a urinary tract infection.  Take Keflex 500 mg 3 times daily for a week  You need to follow-up with the wound care doctor  Return to ER if you have worse sacral decub ulcer, fever, abdominal pain.

## 2021-09-20 NOTE — ED Notes (Signed)
Pt has foul odor to urine.

## 2021-09-20 NOTE — ED Provider Notes (Signed)
[facility] DEPT Provider Note   CSN: [id number] Arrival date & time: 09/20/21  1610     History  Chief Complaint  Patient presents with   pressure sore    Dysuria    Valerie [name] is a 76 y.o. female history of non-small cell lung cancer, bedbound with known sacral decub ulcer here presenting with worsening sacral decub ulcer.  Patient recently finished antibiotics for UTI.  She is bedbound and from a nursing home.  Per the nursing home, patient's sacral decub ulcer appears worse than usual.  They are also concerned for possible UTI as well.  Patient denies any fevers  The history is provided by the patient.      Home Medications Prior to Admission medications   Medication Sig Start Date End Date Taking? Authorizing Provider  ACETAMINOPHEN EXTRA STRENGTH 500 MG tablet Take 1,000 mg by mouth 3 (three) times daily as needed. 08/28/21   [provider]  Ascorbic Acid (VITAMIN C) 100 MG tablet Take 100 mg by mouth every evening. Gummie    [provider]  cephALEXin (KEFLEX) 250 MG capsule Take 1 capsule (250 mg total) by mouth 4 (four) times daily. 08/22/21   Daleen Bo, MD  cetaphil (CETAPHIL) lotion Apply 1 application topically daily as needed for dry skin.    [provider]  Cholecalciferol (D3 PO) Take 1 capsule by mouth daily.    [provider]  donepezil (ARICEPT) 10 MG tablet TAKE 1 TABLET BY MOUTH ONCE DAILY IN THE EVENING Patient taking differently: Take 10 mg by mouth at bedtime. 04/03/21   [name], MD  loperamide (IMODIUM A-D) 2 MG tablet Take 4 mg by mouth 3 (three) times daily as needed for diarrhea or loose stools.    [provider]  MAGNESIUM PO Take 200 mg by mouth daily. Patient not taking: Reported on 07/18/2021    [provider]  Multiple Vitamin (MULTI-VITAMINS) TABS Take 1 tablet by mouth every evening.     [provider]  prochlorperazine (COMPAZINE) 10  MG tablet Take 1 tablet (10 mg total) by mouth every 6 (six) hours as needed for nausea or vomiting. 05/30/20   Curt Bears, MD  vitamin B-12 (CYANOCOBALAMIN) 1000 MCG tablet Take 1,000 mcg by mouth every evening.    [provider]      Allergies    Sulfa antibiotics    Review of Systems   Review of Systems  Genitourinary:  Positive for dysuria.  Skin:  Positive for wound.  All other systems reviewed and are negative.  Physical Exam Updated Vital Signs BP (!) 146/68    Pulse 69    Temp 98.6 F (37 C) (Oral)    Resp 18    SpO2 93%  Physical Exam Vitals and nursing note reviewed.  Constitutional:      Comments: Chronically ill-appearing  HENT:     Head: Normocephalic.     Nose: Nose normal.     Mouth/Throat:     Mouth: Mucous membranes are moist.  Eyes:     Extraocular Movements: Extraocular movements intact.     Pupils: Pupils are equal, round, and reactive to light.  Cardiovascular:     Rate and Rhythm: Normal rate and regular rhythm.     Pulses: Normal pulses.     Heart sounds: Normal heart sounds.  Pulmonary:     Effort: Pulmonary effort is normal.     Breath sounds: Normal breath sounds.  Abdominal:  General: Abdomen is flat.     Palpations: Abdomen is soft.  Musculoskeletal:        General: Normal range of motion.     Cervical back: Normal range of motion and neck supple.  Skin:    Comments: Patient has stage I left buttock ulcer.  There is some fluctuance there.  Unclear if there is any underlying abscess  Neurological:     General: No focal deficit present.     Mental Status: She is oriented to person, place, and time.  Psychiatric:        Mood and Affect: Mood normal.        Behavior: Behavior normal.    ED Results / Procedures / Treatments   Labs (all labs ordered are listed, but only abnormal results are displayed) Labs Reviewed  COMPREHENSIVE METABOLIC PANEL - Abnormal; Notable for the following components:      Result Value    Glucose, Bld 101 (*)    BUN 26 (*)    Creatinine, Ser 1.15 (*)    AST 13 (*)    GFR, Estimated 49 (*)    All other components within normal limits  URINALYSIS, ROUTINE W REFLEX MICROSCOPIC - Abnormal; Notable for the following components:   APPearance CLOUDY (*)    Hgb urine dipstick LARGE (*)    Protein, ur 100 (*)    Nitrite POSITIVE (*)    Leukocytes,Ua MODERATE (*)    RBC / HPF >50 (*)    WBC, UA >50 (*)    Bacteria, UA RARE (*)    All other components within normal limits  I-STAT CHEM 8, ED - Abnormal; Notable for the following components:   BUN 24 (*)    Creatinine, Ser 1.10 (*)    All other components within normal limits  CULTURE, BLOOD (ROUTINE X 2)  CULTURE, BLOOD (ROUTINE X 2)  URINE CULTURE  CBC WITH DIFFERENTIAL/PLATELET  LACTIC ACID, PLASMA  LACTIC ACID, PLASMA    EKG None  Radiology CT ABDOMEN PELVIS W CONTRAST  Result Date: 09/20/2021 CLINICAL DATA:  Dysuria, dementia, worsening sacral decubitus ulcers EXAM: CT ABDOMEN AND PELVIS WITH CONTRAST TECHNIQUE: Multidetector CT imaging of the abdomen and pelvis was performed using the standard protocol following bolus administration of intravenous contrast. RADIATION DOSE REDUCTION: This exam was performed according to the departmental dose-optimization program which includes automated exposure control, adjustment of the mA and/or kV according to patient size and/or use of iterative reconstruction technique. CONTRAST:  73mL OMNIPAQUE IOHEXOL 300 MG/ML  SOLN COMPARISON:  09/12/2021 FINDINGS: Lower chest: Stable left pleural effusion. Chronic elevation of the left hemidiaphragm. Hepatobiliary: No focal liver abnormality is seen. No evidence of cholelithiasis or gallbladder wall thickening. Bile duct normal for age. Pancreas: No acute inflammatory changes. Stable chronic pancreatic duct dilation. Spleen: Normal in size without focal abnormality. Adrenals/Urinary Tract: There is a 7 mm nonobstructing calculus within the right  renal pelvis. There are 2 other punctate less than 3 mm nonobstructing right renal calculi as well. The right kidney enhances normally. 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 There is stable left hydroureter, with mild diffuse enhancement of the left ureteral mucosa. This could reflect underlying infection. Please correlate with urinalysis. No urinary tract calculi. Bladder is decompressed. 5 mm nonobstructing calculus layers dependently within the right-side of the bladder lumen. Stomach/Bowel: No bowel obstruction or ileus. Normal appendix right lower quadrant. No bowel wall thickening or inflammatory change. Vascular/Lymphatic: Aortic atherosclerosis. No enlarged abdominal or pelvic lymph nodes. Reproductive:  Uterus and bilateral adnexa are unremarkable. Other: No free fluid or free intraperitoneal gas. No abdominal wall hernia. Musculoskeletal: No acute or destructive bony lesions. Reconstructed images demonstrate stable chronic compression deformities at T10, L1, L4, and L5. IMPRESSION: 1. Nonobstructing right renal and bladder calculi as above. 2. Chronic left hydroureter, with diffuse left ureteral mucosal enhancement. Findings could reflect underlying infection. Please correlate with urinalysis. 3. Stable left pleural effusion. 4.  Aortic Atherosclerosis (ICD10-I70.0). Electronically Signed   By: Randa Ngo M.D.   On: 09/20/2021 18:32    Procedures Procedures    Medications Ordered in ED Medications  liver oil-zinc oxide (DESITIN) 40 % ointment (has no administration in time range)  morphine (PF) 4 MG/ML injection 4 mg (4 mg Intravenous Given 09/20/21 1735)  sodium chloride 0.9 % bolus 1,000 mL (0 mLs Intravenous Stopped 09/20/21 1931)  piperacillin-tazobactam (ZOSYN) IVPB 3.375 g (0 g Intravenous Stopped 09/20/21 1931)  iohexol (OMNIPAQUE) 300 MG/ML solution 80 mL (80 mLs Intravenous Contrast Given 09/20/21 1803)    ED Course/ Medical Decision Making/ A&P                            Medical Decision Making VANISSA STRENGTH is a 76 y.o. female here presenting with sacral decub ulcer.  Patient has stage I sacral decub ulcer.  It is more prominent on the left buttock area.  There is some fluctuance there.  I wonder if she has a buttock abscess.  We will do sepsis work-up with CBC and CMP and lactate and cultures.  We will also get a CT abdomen pelvis to further assess.  Will give empiric antibiotics to cover skin flora  7:36 PM I reviewed labs and imaging and urinalysis.  Patient CBC is unremarkable.  Patient's creatinine is normal.  Her lactate is normal.  Urinalysis showed UTI.  Previous urine culture showed multiple species.  Given Zosyn in the ED. CT showed chronic left hydroureter some mucosal enhancement.  There is no obvious pyelonephritis or stone however.  Patient is not septic.  Patient will be discharged back to facility with Keflex to cover stage I decub ulcer, UTI.  Also recommend barrier cream and also turning patient every 4 hours.   Problems Addressed: Pressure injury of sacral region, stage 1: acute illness or injury Urinary tract infection without hematuria, site unspecified: acute illness or injury  Amount and/or Complexity of Data Reviewed Independent Historian: EMS External Data Reviewed: notes. Labs: ordered. Decision-making details documented in ED Course. Radiology: ordered.  Risk OTC drugs. Prescription drug management.   Final Clinical Impression(s) / ED Diagnoses Final diagnoses:  Pressure injury of sacral region, stage 1  Urinary tract infection without hematuria, site unspecified    Rx / DC Orders ED Discharge Orders     None         Drenda Freeze, MD 09/20/21 [phone]

## 2021-09-20 NOTE — ED Provider Triage Note (Signed)
Emergency Medicine Provider Triage Evaluation Note  Valerie [name] , a 76 y.o. female  was evaluated in triage.  Pt complains of dysuria.  She denies any blood in her urine.  Does have history of dementia, came from nursing home.  Apparently they sent in due to worsening sacral ulcers.  Denies any fevers.  Review of Systems  Positive: Ulcers, dysuria Negative:   Physical Exam  BP (!) 141/88 (BP Location: Right Arm)    Pulse 88    Temp 98.6 F (37 C) (Oral)    Resp 19    SpO2 100%  Gen:   Awake, no distress   Resp:  Normal effort  MSK:   Moves extremities without difficulty  Other:    Medical Decision Making  Medically screening exam initiated at 4:27 PM.  Appropriate orders placed.  ALEXANDRE LIGHTSEY was informed that the remainder of the evaluation will be completed by another provider, this initial triage assessment does not replace that evaluation, and the importance of remaining in the ED until their evaluation is complete.  Here for dysuria, worsening sacral ulcers.  Unfortunately unable to move patient for assessment in triage to look at ulcers   Carmita Boom A, PA-C 09/20/21 1628

## 2021-09-20 NOTE — ED Notes (Signed)
[facility] CALLED PT IS NUMBER 9 ON THE LIST

## 2021-09-21 ENCOUNTER — Other Ambulatory Visit (HOSPITAL_COMMUNITY): Payer: Self-pay

## 2021-09-21 DIAGNOSIS — R4182 Altered mental status, unspecified: Secondary | ICD-10-CM | POA: Diagnosis not present

## 2021-09-21 DIAGNOSIS — Z743 Need for continuous supervision: Secondary | ICD-10-CM | POA: Diagnosis not present

## 2021-09-22 ENCOUNTER — Telehealth: Payer: Medicare PPO

## 2021-09-22 DIAGNOSIS — I420 Dilated cardiomyopathy: Secondary | ICD-10-CM | POA: Diagnosis not present

## 2021-09-22 DIAGNOSIS — D630 Anemia in neoplastic disease: Secondary | ICD-10-CM | POA: Diagnosis not present

## 2021-09-22 DIAGNOSIS — I110 Hypertensive heart disease with heart failure: Secondary | ICD-10-CM | POA: Diagnosis not present

## 2021-09-22 DIAGNOSIS — I2510 Atherosclerotic heart disease of native coronary artery without angina pectoris: Secondary | ICD-10-CM | POA: Diagnosis not present

## 2021-09-22 DIAGNOSIS — I480 Paroxysmal atrial fibrillation: Secondary | ICD-10-CM | POA: Diagnosis not present

## 2021-09-22 DIAGNOSIS — C7931 Secondary malignant neoplasm of brain: Secondary | ICD-10-CM | POA: Diagnosis not present

## 2021-09-22 DIAGNOSIS — I5041 Acute combined systolic (congestive) and diastolic (congestive) heart failure: Secondary | ICD-10-CM | POA: Diagnosis not present

## 2021-09-22 DIAGNOSIS — C3411 Malignant neoplasm of upper lobe, right bronchus or lung: Secondary | ICD-10-CM | POA: Diagnosis not present

## 2021-09-22 DIAGNOSIS — I471 Supraventricular tachycardia: Secondary | ICD-10-CM | POA: Diagnosis not present

## 2021-09-23 LAB — URINE CULTURE: Culture: >=100000 — AB

## 2021-09-24 ENCOUNTER — Telehealth (HOSPITAL_BASED_OUTPATIENT_CLINIC_OR_DEPARTMENT_OTHER): Payer: Self-pay | Admitting: *Deleted

## 2021-09-24 NOTE — Telephone Encounter (Signed)
Post ED Visit - Positive Culture Follow-up  Culture report reviewed by antimicrobial stewardship pharmacist: [place] Team []  Elenor Quinones, Pharm.D. []  Heide Guile, Pharm.D., BCPS AQ-ID []  Parks Neptune, Pharm.D., BCPS []  Alycia Rossetti, Pharm.D., BCPS []  [place], Pharm.D., BCPS, AAHIVP []  Legrand Como, Pharm.D., BCPS, AAHIVP []  Salome Arnt, PharmD, BCPS []  Johnnette Gourd, PharmD, BCPS []  Hughes Better, PharmD, BCPS []  Leeroy Cha, PharmD []  Laqueta Linden, PharmD, BCPS []  Albertina Parr, PharmD  [place] Team []  Leodis Sias, PharmD []  Lindell Spar, PharmD []  Royetta Asal, PharmD []  Graylin Shiver, Rph []  Rema Fendt) Glennon Mac, PharmD []  Arlyn Dunning, PharmD []  Netta Cedars, PharmD []  Dia Sitter, PharmD []  Leone Haven, PharmD [x]  Gretta Arab, PharmD []  Theodis Shove, PharmD []  Peggyann Juba, PharmD []  Reuel Boom, PharmD   Positive urine culture Treated with Cephalexin, organism sensitive to the same and no further patient follow-up is required at this time.  Rosie Fate 09/24/2021, 8:32 AM

## 2021-09-25 LAB — CULTURE, BLOOD (ROUTINE X 2)
Culture: NO GROWTH
Culture: NO GROWTH
Special Requests: ADEQUATE

## 2021-09-26 DIAGNOSIS — I471 Supraventricular tachycardia: Secondary | ICD-10-CM | POA: Diagnosis not present

## 2021-09-26 DIAGNOSIS — I110 Hypertensive heart disease with heart failure: Secondary | ICD-10-CM | POA: Diagnosis not present

## 2021-09-26 DIAGNOSIS — D630 Anemia in neoplastic disease: Secondary | ICD-10-CM | POA: Diagnosis not present

## 2021-09-26 DIAGNOSIS — C3411 Malignant neoplasm of upper lobe, right bronchus or lung: Secondary | ICD-10-CM | POA: Diagnosis not present

## 2021-09-26 DIAGNOSIS — I2510 Atherosclerotic heart disease of native coronary artery without angina pectoris: Secondary | ICD-10-CM | POA: Diagnosis not present

## 2021-09-26 DIAGNOSIS — I480 Paroxysmal atrial fibrillation: Secondary | ICD-10-CM | POA: Diagnosis not present

## 2021-09-26 DIAGNOSIS — C7931 Secondary malignant neoplasm of brain: Secondary | ICD-10-CM | POA: Diagnosis not present

## 2021-09-26 DIAGNOSIS — I420 Dilated cardiomyopathy: Secondary | ICD-10-CM | POA: Diagnosis not present

## 2021-09-26 DIAGNOSIS — I5041 Acute combined systolic (congestive) and diastolic (congestive) heart failure: Secondary | ICD-10-CM | POA: Diagnosis not present

## 2021-10-01 ENCOUNTER — Encounter (HOSPITAL_COMMUNITY): Payer: Self-pay | Admitting: Emergency Medicine

## 2021-10-01 ENCOUNTER — Other Ambulatory Visit: Payer: Self-pay

## 2021-10-01 ENCOUNTER — Emergency Department (HOSPITAL_COMMUNITY): Payer: Medicare PPO

## 2021-10-01 ENCOUNTER — Inpatient Hospital Stay (HOSPITAL_COMMUNITY)
Admission: EM | Admit: 2021-10-01 | Discharge: 2021-10-10 | DRG: 177 | Disposition: A | Discharge status: Skilled Nursing Facility | Payer: Medicare PPO | Source: Skilled Nursing Facility | Attending: Internal Medicine | Admitting: Internal Medicine

## 2021-10-01 DIAGNOSIS — N390 Urinary tract infection, site not specified: Secondary | ICD-10-CM | POA: Diagnosis present

## 2021-10-01 DIAGNOSIS — Z66 Do not resuscitate: Secondary | ICD-10-CM | POA: Diagnosis present

## 2021-10-01 DIAGNOSIS — R32 Unspecified urinary incontinence: Secondary | ICD-10-CM | POA: Diagnosis present

## 2021-10-01 DIAGNOSIS — L89892 Pressure ulcer of other site, stage 2: Secondary | ICD-10-CM | POA: Diagnosis present

## 2021-10-01 DIAGNOSIS — I2510 Atherosclerotic heart disease of native coronary artery without angina pectoris: Secondary | ICD-10-CM | POA: Diagnosis present

## 2021-10-01 DIAGNOSIS — R471 Dysarthria and anarthria: Secondary | ICD-10-CM | POA: Diagnosis present

## 2021-10-01 DIAGNOSIS — I499 Cardiac arrhythmia, unspecified: Secondary | ICD-10-CM | POA: Diagnosis not present

## 2021-10-01 DIAGNOSIS — E890 Postprocedural hypothyroidism: Secondary | ICD-10-CM | POA: Diagnosis present

## 2021-10-01 DIAGNOSIS — Z515 Encounter for palliative care: Secondary | ICD-10-CM

## 2021-10-01 DIAGNOSIS — G9341 Metabolic encephalopathy: Secondary | ICD-10-CM

## 2021-10-01 DIAGNOSIS — Z882 Allergy status to sulfonamides status: Secondary | ICD-10-CM

## 2021-10-01 DIAGNOSIS — E43 Unspecified severe protein-calorie malnutrition: Secondary | ICD-10-CM | POA: Diagnosis present

## 2021-10-01 DIAGNOSIS — I428 Other cardiomyopathies: Secondary | ICD-10-CM | POA: Diagnosis present

## 2021-10-01 DIAGNOSIS — M810 Age-related osteoporosis without current pathological fracture: Secondary | ICD-10-CM | POA: Diagnosis present

## 2021-10-01 DIAGNOSIS — F419 Anxiety disorder, unspecified: Secondary | ICD-10-CM | POA: Diagnosis present

## 2021-10-01 DIAGNOSIS — G459 Transient cerebral ischemic attack, unspecified: Secondary | ICD-10-CM | POA: Diagnosis present

## 2021-10-01 DIAGNOSIS — R29818 Other symptoms and signs involving the nervous system: Secondary | ICD-10-CM | POA: Diagnosis not present

## 2021-10-01 DIAGNOSIS — R627 Adult failure to thrive: Secondary | ICD-10-CM | POA: Diagnosis present

## 2021-10-01 DIAGNOSIS — G9340 Encephalopathy, unspecified: Secondary | ICD-10-CM | POA: Diagnosis not present

## 2021-10-01 DIAGNOSIS — R4781 Slurred speech: Secondary | ICD-10-CM | POA: Diagnosis not present

## 2021-10-01 DIAGNOSIS — Z9221 Personal history of antineoplastic chemotherapy: Secondary | ICD-10-CM

## 2021-10-01 DIAGNOSIS — I700 Atherosclerosis of aorta: Secondary | ICD-10-CM | POA: Diagnosis not present

## 2021-10-01 DIAGNOSIS — Z91018 Allergy to other foods: Secondary | ICD-10-CM

## 2021-10-01 DIAGNOSIS — R29810 Facial weakness: Secondary | ICD-10-CM | POA: Diagnosis present

## 2021-10-01 DIAGNOSIS — K529 Noninfective gastroenteritis and colitis, unspecified: Secondary | ICD-10-CM | POA: Diagnosis present

## 2021-10-01 DIAGNOSIS — Z8673 Personal history of transient ischemic attack (TIA), and cerebral infarction without residual deficits: Secondary | ICD-10-CM | POA: Diagnosis not present

## 2021-10-01 DIAGNOSIS — C7801 Secondary malignant neoplasm of right lung: Secondary | ICD-10-CM

## 2021-10-01 DIAGNOSIS — F03A0 Unspecified dementia, mild, without behavioral disturbance, psychotic disturbance, mood disturbance, and anxiety: Secondary | ICD-10-CM | POA: Diagnosis not present

## 2021-10-01 DIAGNOSIS — I6782 Cerebral ischemia: Secondary | ICD-10-CM | POA: Diagnosis not present

## 2021-10-01 DIAGNOSIS — C779 Secondary and unspecified malignant neoplasm of lymph node, unspecified: Secondary | ICD-10-CM | POA: Diagnosis present

## 2021-10-01 DIAGNOSIS — I5042 Chronic combined systolic (congestive) and diastolic (congestive) heart failure: Secondary | ICD-10-CM | POA: Diagnosis present

## 2021-10-01 DIAGNOSIS — I619 Nontraumatic intracerebral hemorrhage, unspecified: Secondary | ICD-10-CM | POA: Diagnosis not present

## 2021-10-01 DIAGNOSIS — Z6825 Body mass index (BMI) 25.0-25.9, adult: Secondary | ICD-10-CM

## 2021-10-01 DIAGNOSIS — R918 Other nonspecific abnormal finding of lung field: Secondary | ICD-10-CM | POA: Diagnosis not present

## 2021-10-01 DIAGNOSIS — U071 COVID-19: Secondary | ICD-10-CM | POA: Diagnosis present

## 2021-10-01 DIAGNOSIS — I480 Paroxysmal atrial fibrillation: Secondary | ICD-10-CM | POA: Diagnosis present

## 2021-10-01 DIAGNOSIS — I6523 Occlusion and stenosis of bilateral carotid arteries: Secondary | ICD-10-CM | POA: Diagnosis not present

## 2021-10-01 DIAGNOSIS — I110 Hypertensive heart disease with heart failure: Secondary | ICD-10-CM | POA: Diagnosis present

## 2021-10-01 DIAGNOSIS — Z85850 Personal history of malignant neoplasm of thyroid: Secondary | ICD-10-CM

## 2021-10-01 DIAGNOSIS — Z743 Need for continuous supervision: Secondary | ICD-10-CM | POA: Diagnosis not present

## 2021-10-01 DIAGNOSIS — I10 Essential (primary) hypertension: Secondary | ICD-10-CM | POA: Diagnosis not present

## 2021-10-01 DIAGNOSIS — Z86718 Personal history of other venous thrombosis and embolism: Secondary | ICD-10-CM

## 2021-10-01 DIAGNOSIS — Z87440 Personal history of urinary (tract) infections: Secondary | ICD-10-CM

## 2021-10-01 DIAGNOSIS — B9620 Unspecified Escherichia coli [E. coli] as the cause of diseases classified elsewhere: Secondary | ICD-10-CM | POA: Diagnosis present

## 2021-10-01 DIAGNOSIS — G319 Degenerative disease of nervous system, unspecified: Secondary | ICD-10-CM | POA: Diagnosis not present

## 2021-10-01 DIAGNOSIS — I509 Heart failure, unspecified: Secondary | ICD-10-CM

## 2021-10-01 DIAGNOSIS — F0390 Unspecified dementia without behavioral disturbance: Secondary | ICD-10-CM | POA: Diagnosis present

## 2021-10-01 DIAGNOSIS — Z923 Personal history of irradiation: Secondary | ICD-10-CM

## 2021-10-01 DIAGNOSIS — C7931 Secondary malignant neoplasm of brain: Secondary | ICD-10-CM | POA: Diagnosis present

## 2021-10-01 DIAGNOSIS — Z7189 Other specified counseling: Secondary | ICD-10-CM | POA: Diagnosis not present

## 2021-10-01 DIAGNOSIS — R5381 Other malaise: Secondary | ICD-10-CM | POA: Diagnosis not present

## 2021-10-01 DIAGNOSIS — R051 Acute cough: Secondary | ICD-10-CM | POA: Diagnosis not present

## 2021-10-01 DIAGNOSIS — I6381 Other cerebral infarction due to occlusion or stenosis of small artery: Secondary | ICD-10-CM | POA: Diagnosis not present

## 2021-10-01 DIAGNOSIS — Z85118 Personal history of other malignant neoplasm of bronchus and lung: Secondary | ICD-10-CM

## 2021-10-01 DIAGNOSIS — Z79899 Other long term (current) drug therapy: Secondary | ICD-10-CM

## 2021-10-01 DIAGNOSIS — N3000 Acute cystitis without hematuria: Secondary | ICD-10-CM

## 2021-10-01 DIAGNOSIS — R110 Nausea: Secondary | ICD-10-CM | POA: Diagnosis not present

## 2021-10-01 LAB — URINALYSIS, ROUTINE W REFLEX MICROSCOPIC
Bilirubin Urine: NEGATIVE
Glucose, UA: NEGATIVE mg/dL
Ketones, ur: NEGATIVE mg/dL
Nitrite: NEGATIVE
Protein, ur: 100 mg/dL — AB
RBC / HPF: >50 RBC/hpf — ABNORMAL HIGH (ref 0–5)
Specific Gravity, Urine: 1.027 (ref 1.005–1.030)
WBC, UA: >50 WBC/hpf — ABNORMAL HIGH (ref 0–5)
pH: 7 (ref 5.0–8.0)

## 2021-10-01 LAB — CBC
HCT: 39.7 % (ref 36.0–46.0)
Hemoglobin: 11.9 g/dL — ABNORMAL LOW (ref 12.0–15.0)
MCH: 27.7 pg (ref 26.0–34.0)
MCHC: 30 g/dL (ref 30.0–36.0)
MCV: 92.3 fL (ref 80.0–100.0)
Platelets: 239 10*3/uL (ref 150–400)
RBC: 4.3 MIL/uL (ref 3.87–5.11)
RDW: 14.4 % (ref 11.5–15.5)
WBC: 8.7 10*3/uL (ref 4.0–10.5)
nRBC: 0 % (ref 0.0–0.2)

## 2021-10-01 LAB — COMPREHENSIVE METABOLIC PANEL
ALT: 11 U/L (ref 0–44)
AST: 15 U/L (ref 15–41)
Albumin: 3.1 g/dL — ABNORMAL LOW (ref 3.5–5.0)
Alkaline Phosphatase: 97 U/L (ref 38–126)
Anion gap: 9 (ref 5–15)
BUN: 14 mg/dL (ref 8–23)
CO2: 22 mmol/L (ref 22–32)
Calcium: 8.9 mg/dL (ref 8.9–10.3)
Chloride: 105 mmol/L (ref 98–111)
Creatinine, Ser: 1 mg/dL (ref 0.44–1.00)
GFR, Estimated: 58 mL/min — ABNORMAL LOW (ref >60)
Glucose, Bld: 115 mg/dL — ABNORMAL HIGH (ref 70–99)
Potassium: 3.7 mmol/L (ref 3.5–5.1)
Sodium: 136 mmol/L (ref 135–145)
Total Bilirubin: 0.8 mg/dL (ref 0.3–1.2)
Total Protein: 6.2 g/dL — ABNORMAL LOW (ref 6.5–8.1)

## 2021-10-01 LAB — RAPID URINE DRUG SCREEN, HOSP PERFORMED
Amphetamines: NOT DETECTED
Barbiturates: NOT DETECTED
Benzodiazepines: NOT DETECTED
Cocaine: NOT DETECTED
Opiates: NOT DETECTED
Tetrahydrocannabinol: NOT DETECTED

## 2021-10-01 LAB — RESP PANEL BY RT-PCR (FLU A&B, COVID) ARPGX2
Influenza A by PCR: NEGATIVE
Influenza B by PCR: NEGATIVE
SARS Coronavirus 2 by RT PCR: POSITIVE — AB

## 2021-10-01 LAB — DIFFERENTIAL
Abs Immature Granulocytes: 0.02 10*3/uL (ref 0.00–0.07)
Basophils Absolute: 0 10*3/uL (ref 0.0–0.1)
Basophils Relative: 0 %
Eosinophils Absolute: 0 10*3/uL (ref 0.0–0.5)
Eosinophils Relative: 0 %
Immature Granulocytes: 0 %
Lymphocytes Relative: 9 %
Lymphs Abs: 0.8 10*3/uL (ref 0.7–4.0)
Monocytes Absolute: 0.6 10*3/uL (ref 0.1–1.0)
Monocytes Relative: 7 %
Neutro Abs: 7.3 10*3/uL (ref 1.7–7.7)
Neutrophils Relative %: 84 %

## 2021-10-01 LAB — I-STAT CHEM 8, ED
BUN: 18 mg/dL (ref 8–23)
Calcium, Ion: 1.13 mmol/L — ABNORMAL LOW (ref 1.15–1.40)
Chloride: 106 mmol/L (ref 98–111)
Creatinine, Ser: 0.9 mg/dL (ref 0.44–1.00)
Glucose, Bld: 112 mg/dL — ABNORMAL HIGH (ref 70–99)
HCT: 39 % (ref 36.0–46.0)
Hemoglobin: 13.3 g/dL (ref 12.0–15.0)
Potassium: 3.9 mmol/L (ref 3.5–5.1)
Sodium: 138 mmol/L (ref 135–145)
TCO2: 25 mmol/L (ref 22–32)

## 2021-10-01 LAB — PROTIME-INR
INR: 1.1 (ref 0.8–1.2)
Prothrombin Time: 13.9 seconds (ref 11.4–15.2)

## 2021-10-01 LAB — APTT: aPTT: 25 seconds (ref 24–36)

## 2021-10-01 LAB — CBG MONITORING, ED: Glucose-Capillary: 110 mg/dL — ABNORMAL HIGH (ref 70–99)

## 2021-10-01 LAB — ETHANOL: Alcohol, Ethyl (B): <10 mg/dL (ref <10)

## 2021-10-01 MED ORDER — NIRMATRELVIR/RITONAVIR (PAXLOVID)TABLET: 3.0000 | ORAL_TABLET | Freq: Two times a day (BID) | ORAL | Status: DC

## 2021-10-01 MED ORDER — ENOXAPARIN SODIUM 40 MG/0.4ML IJ SOSY
40.0000 mg | PREFILLED_SYRINGE | INTRAMUSCULAR | Status: DC
  Administered 2021-10-02 – 2021-10-10 (×9): 40 mg via SUBCUTANEOUS
  Filled 2021-10-01 (×9): qty 0.4

## 2021-10-01 MED ORDER — NIRMATRELVIR/RITONAVIR (PAXLOVID) TABLET (RENAL DOSING)
2.0000 | ORAL_TABLET | Freq: Two times a day (BID) | ORAL | Status: AC
  Administered 2021-10-02 – 2021-10-06 (×10): 2 via ORAL
  Filled 2021-10-01 (×3): qty 20

## 2021-10-01 MED ORDER — DONEPEZIL HCL 10 MG PO TABS
10.0000 mg | ORAL_TABLET | Freq: Every day | ORAL | Status: DC
  Administered 2021-10-02 – 2021-10-09 (×8): 10 mg via ORAL
  Filled 2021-10-01 (×10): qty 1

## 2021-10-01 MED ORDER — SODIUM CHLORIDE 0.9 % IV SOLN
1.0000 g | INTRAVENOUS | Status: DC
  Administered 2021-10-02 – 2021-10-05 (×5): 1 g via INTRAVENOUS
  Filled 2021-10-01 (×5): qty 10

## 2021-10-01 MED ORDER — IOHEXOL 350 MG/ML SOLN
75.0000 mL | Freq: Once | INTRAVENOUS | Status: AC | PRN
  Administered 2021-10-01: 75 mL via INTRAVENOUS

## 2021-10-01 NOTE — ED Triage Notes (Signed)
Pt BIB [facility] for Code Stroke. LSN 1600 today. At 1700, staff noticed slurred speech, L sided facial droop, and drooling. No prior hx of stroke or TIA. Hx of lung and brain cancer. No other neuro sx. Pt endorses slight HA and nausea.  EMS VS- 138/76, HR 102, SpO2 97%, CBG 146

## 2021-10-01 NOTE — Consult Note (Signed)
Neurology Consultation Reason for Consult: facial droop.  Referring Physician: Kathrynn Humble, A  CC: Facial droop.   History is obtained from:Patient, EMS  HPI: Valerie [name] is a 76 y.o. female who was reportedly better earlier in the day, then found to be drooling with left facial droop around 5pm. She states, however, that she has been slurring her speech all day.  Given that staff had not noticed it, a code stroke was activated when she was being transported to [facility].  On arrival, she was noticed to have left facial weakness, but given her report of slurred speech throughout the day, no TNK was given.  She reports that she has been feeling ill for the past 24 hours.  LKW: 2/11, prior to bed.  tpa given?: no, out of window.     ROS: A 14 point ROS was performed and is negative except as noted in the HPI.   Past Medical History:  Diagnosis Date   Abnormal Pap smear 2006   Anal fistula    Ankle fracture    Anxiety    Arthritis    Atrophic vaginitis 2008   Cataract    Dementia ([place]) 2009   Dyspareunia 2008   H/O osteoporosis    H/O varicella    H/O vitamin D deficiency    Headache 07/24/2016   Heart murmur    History of measles, mumps, or rubella    History of radiation therapy 07/28/13- 08/10/13   right lung metastasis 5000 cGy 10 sessions   Hypertension    Lung cancer ([place]) dx'd 2002   Lung cancer ([place])    Lung cancer ([place])    Lung metastasis ([place])    PET scan 05/05/13, RUL lung nodule   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   Nodule of right lung CT- 06/03/12   RIGHT UPPER LOBE   Nodule of right lung 06/03/12   Upper Lobe   On antineoplastic chemotherapy    TARCEVA   Osteoporosis 2010   Primary cancer of right upper lobe of lung ([place]) 04/22/2009   Qualifier: Diagnosis of  By: Nils Pyle CMA (AAMA), Leisha     Shortness of breath    hx lung ca    Status post chemotherapy 2003   CARBOPLATIN/PACLITAXEL /STATUS POST  CLINICAL TRAIL OF CELEBREX AND IRESSA AT [facility] FOR 1 YEAR   Status post radiation therapy 2003   LEFT LUNG   Status post radiation therapy 11/07/2005   WHOLE BRAIN: DR Larkin Ina [facility]   Status post radiation therapy 06/02/2008   GAMMA KNIFE OF RESECTED CAVITAY   Thyroid adenoma    ?   Thyroid cancer ([place]) 10/18/11 bx   adenoid nodules    Yeast infection      Family History  Problem Relation Age of Onset   Gait disorder Mother    Cancer Mother        Meningioma   Cancer Father        Pancreatic   Heart failure Sister    Cancer Maternal Aunt        menigeoma     Social History:  reports that she has never smoked. She has never used smokeless tobacco. She reports that she does not drink alcohol and does not use drugs.   Exam: Current vital signs: Wt 74.6 kg    BMI 25.01 kg/m  Vital signs in last 24 hours: Weight:  [74.6 kg] 74.6 kg (02/12 1809)   Physical  Exam  Constitutional: Appears well-developed and well-nourished.  Psych: Affect appropriate to situation Eyes: No scleral injection HENT: No OP obstruction MSK: no joint deformities.  Cardiovascular: Normal rate and regular rhythm.  Respiratory: Effort normal, non-labored breathing GI: Soft.  No distension. There is no tenderness.  Skin: WDI  Neuro: Mental Status: Patient is awake, alert, oriented to person, place, month, year, and situation. Patient is able to give a clear and coherent history. No signs of aphasia or neglect Cranial Nerves: II: Visual Fields are full. Pupils are equal, round, and reactive to light.   III,IV, VI: EOMI without ptosis or diploplia.  V: Facial sensation is symmetric to temperature VII: Facial movement with left facial weakness VIII: hearing is intact to voice X: Uvula elevates symmetrically XI: Shoulder shrug is symmetric. XII: tongue is midline without atrophy or fasciculations.  Motor: Tone is normal. Bulk is normal. 5/5 strength was present in bilateral upper extremities, she  has 2/5 weakness of the right lower extremity proximally, 4/5 distally, 4 -/5 in the left lower extremity.  She suggest that this is chronic. Sensory: Sensation is symmetric to light touch and temperature in the arms and legs. Cerebellar: No clear ataxia on finger-nose-finger      I have reviewed labs in epic and the results pertinent to this consultation are: Creatinine 0.9  I have reviewed the images obtained: CT head/CTA-negative  Impression: Dysarthria with mild facial asymmetry and a 76 year old who has been feeling ill for 24 hours.  On reviewing her image in epic, I do wonder if there is some underlying degree of facial asymmetry at baseline.  Given her report that she has been feeling poor for the past day, I wonder if this could be recrudescence of some underlying deficit versus new stroke.  I would favor getting an MRI, with full stroke work-up only if MRI is positive.   Recommendations: 1) MRI brain 2) stroke work-up only if positive.   Roland Rack, MD [facility] [phone]  If 7pm- 7am, please page neurology on call as listed in [place].

## 2021-10-01 NOTE — H&P (Signed)
History and Physical    Patient: Valerie [name] JIR:[id number] DOB: [birth year] DOA: 10/01/2021 DOS: the patient was seen and examined on 10/01/2021 PCP: Roetta Sessions, NP  Patient coming from: ALF/ILF  Chief Complaint:  Chief Complaint  Patient presents with   Code Stroke    HPI: Valerie [name] is a 76 y.o. female with medical history significant of [place], lung cancer with brain metastasis, paroxysmal atrial fibrillation not on anticoagulation, combined CHF, history of DVT who presents with concerns of left facial droop and dysphagia.  Patient unable to provide much history.  Daughter at bedside states that nursing facility called them since she was appearing more confused with slurred speech and possible left-sided facial drooping.  Patient was seen normal yesterday around 4:00 when family went to visit her at her assisted living facility. Daughter feels that she appears weaker and seems to not be moving her left upper extremity as well.  Reportedly she recently had a UTI and was treated with antibiotics.  Daughter unsure if she is still on this medication.  Patient is incontinent but reports dysuria.  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  She currently only takes Aricept.   Patient presented as a code stroke, no TNK was given due to slower speech throughout the day.  CT head had no acute findings.  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  CTA head and neck with no LVO.  MRI brain also showed no acute intracranial abnormalities.   She is afebrile, tachycardic and tachypneic, normotensive and stable on room air.  No leukocytosis or anemia.  CMP unremarkable.  UDS negative, EtOH less than 10. COVID PCR positive. UA shows positive leukocyte, negative nitrite and rare bacteria.    Review of Systems: unable to review all systems due to the inability of the patient  to answer questions. Past Medical History:  Diagnosis Date   Abnormal Pap smear 2006   Anal fistula    Ankle fracture    Anxiety    Arthritis    Atrophic vaginitis 2008   Cataract    Dementia ([place]) 2009   Dyspareunia 2008   H/O osteoporosis    H/O varicella    H/O vitamin D deficiency    Headache 07/24/2016   Heart murmur    History of measles, mumps, or rubella    History of radiation therapy 07/28/13- 08/10/13   right lung metastasis 5000 cGy 10 sessions   Hypertension    Lung cancer ([place]) dx'd 2002   Lung cancer ([place])    Lung cancer ([place])    Lung metastasis ([place])    PET scan 05/05/13, RUL lung nodule   Metastasis to brain [employer] [facility]) dx'd 2008   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   Nodule of right lung CT- 06/03/12   RIGHT UPPER LOBE   Nodule of right lung 06/03/12   Upper Lobe   On antineoplastic chemotherapy    TARCEVA   Osteoporosis 2010   Primary cancer of right upper lobe of lung ([place]) 04/22/2009   Qualifier: Diagnosis of  By: Nils Pyle CMA (AAMA), Leisha     Shortness of breath    hx lung ca    Status post chemotherapy 2003   CARBOPLATIN/PACLITAXEL /STATUS POST CLINICAL TRAIL OF CELEBREX AND IRESSA AT [facility] FOR 1 YEAR   Status post radiation therapy 2003   LEFT LUNG   Status post radiation therapy 11/07/2005   WHOLE BRAIN: DR  [facility]IN [facility]   Status post radiation therapy 06/02/2008   GAMMA KNIFE OF RESECTED CAVITAY   Thyroid adenoma    ?   Thyroid cancer ([place]) 10/18/11 bx   adenoid nodules    Yeast infection    Past Surgical History:  Procedure Laterality Date   BRAIN SURGERY     INTRAVASCULAR ULTRASOUND/IVUS N/A 02/16/2020   Procedure: Intravascular Ultrasound/IVUS;  Surgeon: Leonie Man, MD;  Location: [facility];  Service: Cardiovascular;  Laterality: N/A;   LEFT LOWER LOBECTOMY  05/2001   LEFT OCCIPITAL CRANIOTOMY  09/21/2005   tumor   LUNG LOBECTOMY     NECK SURGERY     ORIF ANKLE FRACTURE Left 10/02/2014   Procedure: OPEN REDUCTION  INTERNAL FIXATION (ORIF) ANKLE FRACTURE;  Surgeon: Alta Corning, MD;  Location: [facility];  Service: Orthopedics;  Laterality: Left;   ORIF FEMUR FRACTURE Right 06/11/2019   Procedure: OPEN REDUCTION INTERNAL FIXATION (ORIF) DISTAL FEMUR FRACTURE;  Surgeon: Renette Butters, MD;  Location: [place];  Service: Orthopedics;  Laterality: Right;   ORIF TIBIA PLATEAU Right 06/11/2019   Procedure: Open Reduction Internal Fixation (Orif) Tibial Plateau;  Surgeon: Renette Butters, MD;  Location: [facility] [place];  Service: Orthopedics;  Laterality: Right;   RADICAL NECK DISSECTION  10/18/2011   Procedure: RADICAL NECK DISSECTION;  Surgeon: Izora Gala, MD;  Location: [facility];  Service: ENT;  Laterality: Right;  RIGHT MODIFIED NECK DISSECTION /POSSIBLE RIGHT THYROIDECTOM   RIGHT/LEFT HEART CATH AND CORONARY ANGIOGRAPHY N/A 02/16/2020   Procedure: RIGHT/LEFT HEART CATH AND CORONARY ANGIOGRAPHY;  Surgeon: Leonie Man, MD;  Location: [facility];  Service: Cardiovascular;  Laterality: N/A;   TEE WITHOUT CARDIOVERSION N/A 03/28/2020   Procedure: TRANSESOPHAGEAL ECHOCARDIOGRAM (TEE);  Surgeon: Buford Dresser, MD;  Location: [facility];  Service: Cardiovascular;  Laterality: N/A;   THYROIDECTOMY  10/18/2011   Procedure: THYROIDECTOMY WITH RADICAL NECK DISSECTION;  Surgeon: Izora Gala, MD;  Location: [place];  Service: ENT;  Laterality: Right;   Social History:  reports that she has never smoked. She has never used smokeless tobacco. She reports that she does not drink alcohol and does not use drugs.  Allergies  Allergen Reactions   Broccoli [Brassica Oleracea] Other (See Comments)    unknown   Fruit Extracts Other (See Comments)    unknown   Sulfa Antibiotics Other (See Comments)    She cant remember  what happens    Family History  Problem Relation Age of Onset   Gait disorder Mother    Cancer Mother        Meningioma   Cancer Father        Pancreatic   Heart failure Sister    Cancer Maternal  Aunt        menigeoma    Prior to Admission medications   Medication Sig Start Date End Date Taking? Authorizing Provider  ACETAMINOPHEN EXTRA STRENGTH 500 MG tablet Take 1,000 mg by mouth every 8 (eight) hours as needed for moderate pain. 08/28/21  Yes [provider]  Ascorbic Acid (VITAMIN C) 100 MG tablet Take 100 mg by mouth every evening. Gummie   Yes [provider]  donepezil (ARICEPT) 10 MG tablet TAKE 1 TABLET BY MOUTH ONCE DAILY IN THE EVENING Patient taking differently: Take 10 mg by mouth every evening. 04/03/21  Yes [name], MD  EMOLLIENT EX Apply 1 application topically daily as needed (skin health).   Yes [provider]  erlotinib (TARCEVA) 100  MG tablet Take 100 mg by mouth daily. Take on an empty stomach 1 hour before meals or 2 hours after   Yes [provider]  LOPERAMIDE HCL PO Take 4 mg by mouth every 8 (eight) hours as needed (diarrhea).   Yes [provider]  Magnesium 200 MG TABS Take 200 mg by mouth daily.   Yes [provider]  Multiple Vitamin (MULTI-VITAMINS) TABS Take 1 tablet by mouth every evening.    Yes [provider]  NON FORMULARY Apply 1 application topically every 4 (four) hours as needed (for rash to buttocks).   Yes [provider]  prochlorperazine (COMPAZINE) 10 MG tablet Take 1 tablet (10 mg total) by mouth every 6 (six) hours as needed for nausea or vomiting. 05/30/20  Yes Curt Bears, MD  vitamin B-12 (CYANOCOBALAMIN) 1000 MCG tablet Take 1,000 mcg by mouth every evening.   Yes [provider]  Zinc Oxide (DESITIN) 13 % CREA Apply 1 application topically 2 (two) times daily. Patient taking differently: Apply 1 application topically 2 (two) times daily. To groin area 09/20/21  Yes Drenda Freeze, MD  cephALEXin (KEFLEX) 500 MG capsule Take 1 capsule (500 mg total) by mouth 3 (three) times daily. 09/20/21   Drenda Freeze, MD    Physical Exam: Vitals:    10/01/21 1900 10/01/21 2047 10/01/21 2115 10/01/21 2145  BP: (!) 144/68 (!) 141/67 115/63 115/62  Pulse: (!) 102 (!) 101 86 87  Resp: (!) 25 19 (!) 24 (!) 25  Temp:      TempSrc:      SpO2: 96% 94% 96% 96%  Weight:       Constitutional: NAD, calm, comfortable, thin elderly female sitting upright in bed Eyes: lids and conjunctivae normal ENMT: Mucous membranes are moist.   Neck: normal, supple Respiratory: clear to auscultation bilaterally, no wheezing, no crackles. Normal respiratory effort. No accessory muscle use.  Cardiovascular: Regular rate and rhythm, no murmurs / rubs / gallops. No extremity edema. Abdomen: no tenderness, no masses palpated. No hepatosplenomegaly. Bowel sounds positive.  Musculoskeletal: no clubbing / cyanosis. No joint deformity upper and lower extremities. Muscle wasting on all extremities  Neurologic: CN 2-12 grossly intact. Sensation intact, 4/5 strength on all extremities. Psychiatric: Alert and oriented to self place but not time.  Able to answer some questions appropriately and other times appears confused and disoriented.  Data Reviewed:  EKG on my review with sinus rhythm and LBBB  Assessment and Plan:  Acute metabolic encephalopathy -pt had confusion/disorientation at times but no appreciable facial droop or dysarthria. CT head and MRI brain negative. Suspect symptoms due to UTI and COVID causing recrudescence of old stroke -Tx UTI and COVID as below  UTI -Daughter reports recent UTI. Will culture urine.  -start IV Rocephin   COVID-19 viral infection -Asymptomatic but is high risk. Daughters agreeable to her starting Paxlovid   Paroxysmal atrial fibrillation -CHA2DS2/VAS Stroke Risk Points=8 -Not anticoagulation due to hematuria.  She has shared decision-making with cardiology and decided to discontinue anticoagulation in the past.  History of CAD - Not on antiplatelet therapy due to high risk for bleeding  Nonischemic cardiomyopathy -  Patient not on ACE or beta-blocker due to history of hypotension - compensated   Dementia continue aricept      Advance Care Planning:   Code Status: DNR   Consults: Neurology  Family Communication: Daughter at bedside  Severity of Illness: The appropriate patient status for this patient is OBSERVATION. Observation  status is judged to be reasonable and necessary in order to provide the required intensity of service to ensure the patient's safety. The patient's presenting symptoms, physical exam findings, and initial radiographic and laboratory data in the context of their medical condition is felt to place them at decreased risk for further clinical deterioration. Furthermore, it is anticipated that the patient will be medically stable for discharge from the hospital within 2 midnights of admission.   Author: Orene Desanctis, DO 10/01/2021 11:35 PM  For on call review [email or web address].

## 2021-10-01 NOTE — ED Provider Notes (Signed)
[facility] Emergency Department Provider Note MRN:  [id number]  Arrival date & time: 10/01/21     Chief Complaint   Code Stroke   History of Present Illness   Valerie [name] is a 76 y.o. year-old female with a history of  nonischemic cardiomyopathy, nonobstructive CAD, paroxysmal atrial fibrillation, metastatic NSCLC (diagnosed 62 yr ago, on oral chemo x10 years), history of DVT on anticoagulation, limited mobility since 05/2019 following a LLE fracture presenting to the ED with chief complaint of facial droop.  Patient arrives from her assisted living facility for evaluation of left-sided facial droop.  Facility states that they evaluated the patient earlier in the day and she was at her baseline however at 5 PM they noticed left-sided facial droop and difficulty speaking.  The patient states that she noticed that she was having difficulty producing speech all day.  She denies that she is having chest pain, shortness of breath, abdominal pain does admit to having chills, cough, and congestion.  Denies having dysuria or other urinary symptoms.  States that she has not been eating all day.  Review of Systems  A thorough review of systems was obtained and all systems are negative except as noted in the HPI and PMH.   Patient's Health History    Past Medical History:  Diagnosis Date   Abnormal Pap smear 2006   Anal fistula    Ankle fracture    Anxiety    Arthritis    Atrophic vaginitis 2008   Cataract    Dementia ([place]) 2009   Dyspareunia 2008   H/O osteoporosis    H/O varicella    H/O vitamin D deficiency    Headache 07/24/2016   Heart murmur    History of measles, mumps, or rubella    History of radiation therapy 07/28/13- 08/10/13   right lung metastasis 5000 cGy 10 sessions   Hypertension    Lung cancer ([place]) dx'd 2002   Lung cancer ([place])    Lung cancer ([place])    Lung metastasis ([place])    PET scan 05/05/13, RUL lung nodule   Metastasis to  brain [facility]) dx'd 2008   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   Nodule of right lung CT- 06/03/12   RIGHT UPPER LOBE   Nodule of right lung 06/03/12   Upper Lobe   On antineoplastic chemotherapy    TARCEVA   Osteoporosis 2010   Primary cancer of right upper lobe of lung ([place]) 04/22/2009   Qualifier: Diagnosis of  By: Nils Pyle CMA (AAMA), Leisha     Shortness of breath    hx lung ca    Status post chemotherapy 2003   CARBOPLATIN/PACLITAXEL /STATUS POST CLINICAL TRAIL OF CELEBREX AND IRESSA AT [facility] FOR 1 YEAR   Status post radiation therapy 2003   LEFT LUNG   Status post radiation therapy 11/07/2005   WHOLE BRAIN: DR Larkin [facility]   Status post radiation therapy 06/02/2008   GAMMA KNIFE OF RESECTED CAVITAY   Thyroid adenoma    ?   Thyroid cancer ([place]) 10/18/11 bx   adenoid nodules    Yeast infection     Past Surgical History:  Procedure Laterality Date   BRAIN SURGERY     INTRAVASCULAR ULTRASOUND/IVUS N/A 02/16/2020   Procedure: Intravascular Ultrasound/IVUS;  Surgeon: Leonie Man, MD;  Location: [place];  Service: Cardiovascular;  Laterality: N/A;   LEFT LOWER LOBECTOMY  05/2001   LEFT OCCIPITAL CRANIOTOMY  09/21/2005   tumor  LUNG LOBECTOMY     NECK SURGERY     ORIF ANKLE FRACTURE Left 10/02/2014   Procedure: OPEN REDUCTION INTERNAL FIXATION (ORIF) ANKLE FRACTURE;  Surgeon: Alta Corning, MD;  [facility];  Service: Orthopedics;  Laterality: Left;   ORIF FEMUR FRACTURE Right 06/11/2019   Procedure: OPEN REDUCTION INTERNAL FIXATION (ORIF) DISTAL FEMUR FRACTURE;  Surgeon: Renette Butters, MD;  Location: [place];  Service: Orthopedics;  Laterality: Right;   ORIF TIBIA PLATEAU Right 06/11/2019   Procedure: Open Reduction Internal Fixation (Orif) Tibial Plateau;  Surgeon: Renette Butters, MD;  Location: [place];  Service: Orthopedics;  Laterality: Right;   RADICAL NECK DISSECTION  10/18/2011   Procedure: RADICAL NECK DISSECTION;  Surgeon: Izora Gala, MD;   Location: [place];  Service: ENT;  Laterality: Right;  RIGHT MODIFIED NECK DISSECTION /POSSIBLE RIGHT THYROIDECTOM   RIGHT/LEFT HEART CATH AND CORONARY ANGIOGRAPHY N/A 02/16/2020   Procedure: RIGHT/LEFT HEART CATH AND CORONARY ANGIOGRAPHY;  Surgeon: Leonie Man, MD;  Location: [place] [facility];  Service: Cardiovascular;  Laterality: N/A;   TEE WITHOUT CARDIOVERSION N/A 03/28/2020   Procedure: TRANSESOPHAGEAL ECHOCARDIOGRAM (TEE);  Surgeon: Buford Dresser, MD;  Location: [facility]OSCOPY;  Service: Cardiovascular;  Laterality: N/A;   THYROIDECTOMY  10/18/2011   Procedure: THYROIDECTOMY WITH RADICAL NECK DISSECTION;  Surgeon: Izora Gala, MD;  Location: [facility];  Service: ENT;  Laterality: Right;    Family History  Problem Relation Age of Onset   Gait disorder Mother    Cancer Mother        Meningioma   Cancer Father        Pancreatic   Heart failure Sister    Cancer Maternal Aunt        menigeoma    Social History   Socioeconomic History   Marital status: Widowed    Spouse name: Not on file   Number of children: 2   Years of education: Not on file   Highest education level: Not on file  Occupational History   Occupation: Pharmacist, hospital    Comment: retired after 52 years,from [employer]  Tobacco Use   Smoking status: Never   Smokeless tobacco: Never   Tobacco comments:    smoked few years in college  Vaping Use   Vaping Use: Never used  Substance and Sexual Activity   Alcohol use: No   Drug use: No   Sexual activity: Yes    Birth control/protection: Post-menopausal  Other Topics Concern   Not on file  Social History Narrative   ** Merged History Encounter **       Social Determinants of Health   Financial Resource Strain: Not on file  Food Insecurity: Not on file  Transportation Needs: Not on file  Physical Activity: Not on file  Stress: Not on file  Social Connections: Not on file  Intimate Partner Violence: Not on file     Physical Exam   Physical  Exam Constitutional:      Appearance: She is well-developed. She is not ill-appearing.  HENT:     Head: Normocephalic and atraumatic.     Right Ear: External ear normal.     Left Ear: External ear normal.     Nose: Nose normal.  Cardiovascular:     Rate and Rhythm: Normal rate and regular rhythm.     Pulses: Normal pulses.     Heart sounds: Normal heart sounds.  Pulmonary:     Effort: Pulmonary effort is normal.     Breath  sounds: Normal breath sounds.  Abdominal:     General: Abdomen is flat.     Palpations: Abdomen is soft.  Musculoskeletal:     Cervical back: Neck supple.  Skin:    General: Skin is warm and dry.  Neurological:     General: No focal deficit present.     Mental Status: She is alert and oriented to person, place, and time.     GCS: GCS eye subscore is 4. GCS verbal subscore is 5. GCS motor subscore is 6.     Sensory: Sensation is intact.     Motor: Motor function is intact.     Comments: L Facial weakness noted.  Plan baseline feels of the patient is slightly lethargic and states that her speech is not at her baseline.      Diagnostic and Interventional Summary    EKG Interpretation  Date/Time:  Sunday October 01 2021 18:41:37 EST Ventricular Rate:  94 PR Interval:  118 QRS Duration: 140 QT Interval:  418 QTC Calculation: 523 R Axis:   35 Text Interpretation: Sinus rhythm Borderline short PR interval Left bundle branch block No acute changes No significant change since last tracing Confirmed by Varney Biles [phone]) on 10/01/2021 8:55:58 PM       Labs Reviewed  RESP PANEL BY RT-PCR (FLU A&B, COVID) ARPGX2 - Abnormal; Notable for the following components:      Result Value   SARS Coronavirus 2 by RT PCR POSITIVE (*)    All other components within normal limits  CBC - Abnormal; Notable for the following components:   Hemoglobin 11.9 (*)    All other components within normal limits  COMPREHENSIVE METABOLIC PANEL - Abnormal; Notable for the  following components:   Glucose, Bld 115 (*)    Total Protein 6.2 (*)    Albumin 3.1 (*)    GFR, Estimated 58 (*)    All other components within normal limits  URINALYSIS, ROUTINE W REFLEX MICROSCOPIC - Abnormal; Notable for the following components:   Color, Urine AMBER (*)    APPearance TURBID (*)    Hgb urine dipstick MODERATE (*)    Protein, ur 100 (*)    Leukocytes,Ua LARGE (*)    RBC / HPF >50 (*)    WBC, UA >50 (*)    Bacteria, UA RARE (*)    All other components within normal limits  CBG MONITORING, ED - Abnormal; Notable for the following components:   Glucose-Capillary 110 (*)    All other components within normal limits  I-STAT CHEM 8, ED - Abnormal; Notable for the following components:   Glucose, Bld 112 (*)    Calcium, Ion 1.13 (*)    All other components within normal limits  URINE CULTURE  ETHANOL  PROTIME-INR  APTT  DIFFERENTIAL  RAPID URINE DRUG SCREEN, HOSP PERFORMED    MR BRAIN WO CONTRAST  Final Result    DG Chest Portable 1 View  Final Result    CT ANGIO HEAD NECK W WO CM (CODE STROKE)  Final Result    CT HEAD CODE STROKE WO CONTRAST  Final Result      Medications  iohexol (OMNIPAQUE) 350 MG/ML injection 75 mL (75 mLs Intravenous Contrast Given 10/01/21 1829)     Procedures  /  Critical Care Procedures  ED Course and Medical Decision Making  Initial Impression and Ddx This is a 76 year old female presents to emergency department for evaluation of strokelike symptoms.  Patient was activated as a tier 1  code stroke based off of EMSs and code.  At the time the patient arrived in the emergency department neurology was at bedside.  Neurology walked with the patient to the CT scanner and obtain CT head without contrast and CTA.  These were negative for acute large vessel occlusion.  Further differential for the patient's presentation includes TIA, ischemic stroke, electrolyte abnormality, infection including pneumonia, viral infection, and  cystitis.  Will obtain labs and imaging to further evaluate.  Past medical/surgical history that increases complexity of ED encounter: As mentioned in the HPI.  Interpretation of Diagnostics I personally reviewed the EKG, Chest Xray, and Cardiac Monitor and my interpretation is as follows: Patient is in sinus rhythm on EKG.  Chest x-ray without focal consolidation to suggest pneumonia.  Remains in sinus rhythm on cardiac monitor.  No acute ST elevations noted to suggest ischemic heart disease.    Patient was noted to be COVID-19 positive.  Patient also has leukocyte Estrace positive urine with numerous white blood cells.  Nitrite negative.  Urine culture was sent on this urine.  As for the rest of the patient's metabolic work-up per electrolyte panel was largely unremarkable.  No significant leukocytosis or anemia was present on CBC.  MRI was obtained which did not reveal acute ischemic stroke.  Given that the patient has not had full return to baseline and is still encephalopathic according to family who is at bedside I discussed the case with the on-call neurology team Cheral Marker) who agreed that admission for observation was reasonable  Patient Reassessment and Ultimate Disposition/Management Dr. Flossie Buffy with the hospitalist team was consulted for admission given that the patient was encephalopathic likely from a COVID-19 source.  They agreed admit the patient to their service for further management.  Patient management required discussion with the following services or consulting groups:  Hospitalist Service and Neurology  Complexity of Problems Addressed Acute illness or injury that poses threat of life of bodily function  Additional Data Reviewed and Analyzed Further history obtained from: EMS on arrival, Recent PCP notes, and Recent Consult notes  Factors Impacting ED Encounter Risk Consideration of hospitalization    Final Clinical Impressions(s) / ED Diagnoses     ICD-10-CM   1.  TIA (transient ischemic attack)  G45.9     2. COVID-19  U07.1     3. Encephalopathy  G93.40          Zachery Dakins, MD 10/01/21 [id number]    Varney Biles, MD 10/02/21 [phone]

## 2021-10-01 NOTE — ED Notes (Signed)
Patient transported to MRI 

## 2021-10-02 DIAGNOSIS — R627 Adult failure to thrive: Secondary | ICD-10-CM | POA: Diagnosis not present

## 2021-10-02 DIAGNOSIS — Z515 Encounter for palliative care: Secondary | ICD-10-CM | POA: Diagnosis not present

## 2021-10-02 DIAGNOSIS — Z7189 Other specified counseling: Secondary | ICD-10-CM | POA: Diagnosis not present

## 2021-10-02 DIAGNOSIS — U071 COVID-19: Secondary | ICD-10-CM | POA: Diagnosis not present

## 2021-10-02 DIAGNOSIS — G9341 Metabolic encephalopathy: Secondary | ICD-10-CM | POA: Diagnosis not present

## 2021-10-02 LAB — CBC
HCT: 37 % (ref 36.0–46.0)
Hemoglobin: 11.7 g/dL — ABNORMAL LOW (ref 12.0–15.0)
MCH: 28.5 pg (ref 26.0–34.0)
MCHC: 31.6 g/dL (ref 30.0–36.0)
MCV: 90 fL (ref 80.0–100.0)
Platelets: 236 10*3/uL (ref 150–400)
RBC: 4.11 MIL/uL (ref 3.87–5.11)
RDW: 14.4 % (ref 11.5–15.5)
WBC: 8 10*3/uL (ref 4.0–10.5)
nRBC: 0 % (ref 0.0–0.2)

## 2021-10-02 LAB — BASIC METABOLIC PANEL
Anion gap: 10 (ref 5–15)
BUN: 16 mg/dL (ref 8–23)
CO2: 21 mmol/L — ABNORMAL LOW (ref 22–32)
Calcium: 8.9 mg/dL (ref 8.9–10.3)
Chloride: 106 mmol/L (ref 98–111)
Creatinine, Ser: 0.97 mg/dL (ref 0.44–1.00)
GFR, Estimated: >60 mL/min (ref >60)
Glucose, Bld: 114 mg/dL — ABNORMAL HIGH (ref 70–99)
Potassium: 3.5 mmol/L (ref 3.5–5.1)
Sodium: 137 mmol/L (ref 135–145)

## 2021-10-02 MED ORDER — SODIUM CHLORIDE 0.9 % IV SOLN: INTRAVENOUS | Status: DC

## 2021-10-02 NOTE — Consult Note (Signed)
Consultation Note Date: 10/02/2021   Patient Name: Valerie [name]  DOB: [birth year]  MRN: [id number]  Age / Sex: 76 y.o., female  PCP: Roetta Sessions, NP Referring Physician: Shelly Coss, MD  Reason for Consultation: Establishing goals of care  HPI/Patient Profile: 76 y.o. 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 Initial concern for stroke that has been ruled out.   Clinical Assessment and Goals of Care: [name] is sleeping and RN reports that she has been a little confused and slow to process when awake and communicating. I will not awaken at this time and will call and speak with family to gather more information. I have reviewed records from [name]'s history including oncology and past hospitalizations and office visits. Reviewed diagnostics CMET, BMET, CBC, PCXR.   I called and spoke with Ms. Louischarles's daughter and Saundra Shelling. She shares about her mother's overall gradual decline and how [name]'s husband was her primary caregiver but he died in June 19, 2021. They have 2 daughters but they work and unable to meet [name]'s needs and she has been living at [facility] for ~2 months now. She seems mostly happy there as staff are kind and they assist with bathing, transfer to wheelchair, and taking her down to eat breakfast. She is incontinent and they assist to clean her as well. Family have assisted with trying to provide maximal supplemental care to her at [facility]. 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 She spent time travelling and  studying abroad in [place] and ultimately married her husband who was from [place] (they did not meet in [place]).   We spoke more about recent decision to stop Tarceva as it was giving her diarrhea (which is very complicated given her incontinence) and fatigue. She agreed with her mother that she had received the benefit of this medication and now the side effects were impacted her quality of life and the risks outweigh the benefits. They have discussed hospice previously but [name] was continuing with some rehab therapy to assist with her transfers and optimize her functional status - she still felt she was benefiting. We discussed further about care at her current facility vs higher level of care as well as the benefits and when to utilize hospice. [name] did express they have a desire to avoid hospitalization but also recognize the limitations of staff at ALF to manage certain situations. We discussed hospice as a resource to help support them as well as ALF staff to manage her in place the best they are able. We discussed that hospice would be an option for [name] whenever she wished to have these services.   All questions/concerns addressed. Emotional support provided.   Primary Decision Maker PATIENT    SUMMARY  OF RECOMMENDATIONS   - DNR in place - Follow for progress - Educated family on hospice options  Code Status/Advance Care Planning: DNR   Symptom Management:  Diarrhea: Consider immodium.  Fatigue: Ensure that she is resting well at night. Consider night aide with melatonin or trazodone if needed.   Palliative Prophylaxis:  Aspiration, Delirium Protocol, Frequent Pain Assessment, Oral Care, and Turn Reposition  Prognosis:  Overall prognosis poor with declining functional status and overall failure to thrive.   Discharge Planning: To Be Determined      Primary Diagnoses: Present on Admission:  Dementia ([place])  DNR (do not resuscitate)  PAF (paroxysmal atrial  fibrillation) ([place])   I have reviewed the medical record, interviewed the patient and family, and examined the patient. The following aspects are pertinent.  Past Medical History:  Diagnosis Date   Abnormal Pap smear 2006   Anal fistula    Ankle fracture    Anxiety    Arthritis    Atrophic vaginitis 2008   Cataract    Dementia ([place]) 2009   Dyspareunia 2008   H/O osteoporosis    H/O varicella    H/O vitamin D deficiency    Headache 07/24/2016   Heart murmur    History of measles, mumps, or rubella    History of radiation therapy 07/28/13- 08/10/13   right lung metastasis 5000 cGy 10 sessions   Hypertension    Lung cancer ([place]) dx'd 2002   Lung cancer ([place])    Lung cancer ([place])    Lung metastasis ([place])    PET scan 05/05/13, RUL lung nodule   Metastasis to brain [facility]) dx'd 2008   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   Nodule of right lung CT- 06/03/12   RIGHT UPPER LOBE   Nodule of right lung 06/03/12   Upper Lobe   On antineoplastic chemotherapy    TARCEVA   Osteoporosis 2010   Primary cancer of right upper lobe of lung ([place]) 04/22/2009   Qualifier: Diagnosis of  By: Nils Pyle CMA (AAMA), Leisha     Shortness of breath    hx lung ca    Status post chemotherapy 2003   CARBOPLATIN/PACLITAXEL /STATUS POST CLINICAL TRAIL OF CELEBREX AND IRESSA AT [facility] FOR 1 YEAR   Status post radiation therapy 2003   LEFT LUNG   Status post radiation therapy 11/07/2005   WHOLE BRAIN: DR Larkin [facility]   Status post radiation therapy 06/02/2008   GAMMA KNIFE OF RESECTED CAVITAY   Thyroid adenoma    ?   Thyroid cancer ([place]) 10/18/11 bx   adenoid nodules    Yeast infection    Social History   Socioeconomic History   Marital status: Widowed    Spouse name: Not on file   Number of children: 2   Years of education: Not on file   Highest education level: Not on file  Occupational History   Occupation: Pharmacist, hospital    Comment: retired after 71 years,from [employer]  Tobacco Use    Smoking status: Never   Smokeless tobacco: Never   Tobacco comments:    smoked few years in college  Vaping Use   Vaping Use: Never used  Substance and Sexual Activity   Alcohol use: No   Drug use: No   Sexual activity: Yes    Birth control/protection: Post-menopausal  Other Topics Concern   Not on file  Social History Narrative   ** Merged History Encounter **       Social Determinants of  Health   Financial Resource Strain: Not on file  Food Insecurity: Not on file  Transportation Needs: Not on file  Physical Activity: Not on file  Stress: Not on file  Social Connections: Not on file   Family History  Problem Relation Age of Onset   Gait disorder Mother    Cancer Mother        Meningioma   Cancer Father        Pancreatic   Heart failure Sister    Cancer Maternal Aunt        menigeoma   Scheduled Meds:  donepezil  10 mg Oral QHS   enoxaparin (LOVENOX) injection  40 mg Subcutaneous Q24H   nirmatrelvir/ritonavir EUA (renal dosing)  2 tablet Oral BID   Continuous Infusions:  sodium chloride 75 mL/hr at 10/02/21 1326   cefTRIAXone (ROCEPHIN)  IV Stopped (10/02/21 0159)   PRN Meds:. Allergies  Allergen Reactions   Broccoli [Brassica Oleracea] Other (See Comments)    unknown   Fruit Extracts Other (See Comments)    unknown   Sulfa Antibiotics Other (See Comments)    She cant remember  what happens   Review of Systems  Constitutional:        Sleeping   Physical Exam Vitals and nursing note reviewed.  Constitutional:      General: She is sleeping.     Appearance: She is ill-appearing.  Cardiovascular:     Rate and Rhythm: Normal rate.  Pulmonary:     Effort: No tachypnea, accessory muscle usage or respiratory distress.  Abdominal:     General: Abdomen is flat.  Neurological:     Comments: Sleeping - did not awaken    Vital Signs: BP (!) 115/57    Pulse 84    Temp 98.7 F (37.1 C) (Oral)    Resp 20    Wt 74.6 kg    SpO2 96%    BMI 25.01 kg/m  Pain  Scale: 0-10   Pain Score: 0-No pain   SpO2: SpO2: 96 % O2 Device:SpO2: 96 % O2 Flow Rate: .   IO: Intake/output summary: No intake or output data in the 24 hours ending 10/02/21 1408  LBM:   Baseline Weight: Weight: 74.6 kg Most recent weight: Weight: 74.6 kg     Palliative Assessment/Data:     Time Total: 60 min  Greater than 50%  of this time was spent counseling and coordinating care related to the above assessment and plan.  Signed by: Vinie Sill, NP Palliative Medicine Team Pager # [phone] (M-F 8a-5p) Team Phone # [phone] (Nights/Weekends)

## 2021-10-02 NOTE — Progress Notes (Signed)
PROGRESS NOTE  Valerie [name]  YIR:[id number] DOB: 07-Jun-1946 DOA: 10/01/2021 PCP: Roetta Sessions, NP   Brief Narrative: Patient is a 76 year old female with history of metastatic non-small cell lung cancer currently on not on any treatment, paroxysmal A-fib not on anticoagulation, combined systolic/diastolic and stable failure, history of DVT who presents with concern of left facial droop, dysphagia.  She was found to be confused with slurred speech when family went to visit her at assisted living facility.  She looked more weaker and  was brought to the emergency department.  Presented as a code stroke.  CT head, MRI of the brain did not show any acute intracranial normalities so a stroke has been ruled out.  UA was history of UTI, also found to be COVID-positive.  Assessment & Plan:  Principal Problem:   Acute metabolic encephalopathy Active Problems:   Dementia ([place])   Congestive heart failure (CHF) (H[facility])   DNR (do not resuscitate)   PAF (paroxysmal atrial fibrillation) ([place])   UTI (urinary tract infection)   COVID-19 virus infection   Assessment and Plan: No notes have been filed under this hospital service. Service: Hospitalist   Altered mental status/acute metabolic encephalopathy: Likely secondary to COVID, UTI.  Presented with concern for left facial droop, dysarthria, confusion.  Brain imaging is negative for acute findings.  Stroke has been ruled out.  Monitor mental status  Urinary tract infection: History of recurrent UTI.  UA was suggestive of UTI.  Follow-up urine culture.  Started on ceftriaxone  COVID infection: Respiratory status is stable.  Chest x-ray Showed retrocardiac opacity, not sure if there is pneumonia or lung cancer.  Since she was asymptomatic, not started on any treatment but currently on Paxlovid after discussion with family  Paroxysmal A-fib: Not on anticoagulation due to history of hematuria.  Currently in normal sinus rhythm.  Monitor  on telemetry  History of coronary artery disease: Not on antiplatelet therapy.  No anginal symptoms  History of combined systolic/diastolic congestive heart failure: Currently euvolemic.  Last echo on 03/2020 showed EF of 25 to 50%, grade 1 diastolic dysfunction.  History of dementia: On Aricept  Severe protein calorie malnutrition: Continue gentle IV fluids, nutritionist consulted  Debility/deconditioning: We will request for PT/OT evaluation.  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 She was on Tarceva but currently on hold because of side affects.  Oncology has recommended palliative care/hospice referral.  We will consult  palliative care.  Was following with Dr. Julien Nordmann. Daughter interested on discussion about hospice if needed.          DVT prophylaxis:enoxaparin (LOVENOX) injection 40 mg Start: 10/02/21 1000     Code Status: DNR  Family Communication: Daughter on phone on 2/13  Patient status: Observation  Patient is from : ALF  Anticipated discharge to: ALF versus skilled nursing facility versus hospice  Estimated DC date: 2 to 3 days   Consultants: None  Procedures: None  Antimicrobials:  Anti-infectives (From admission, onward)    Start     Dose/Rate Route Frequency Ordered Stop   10/01/21 2345  nirmatrelvir/ritonavir EUA (PAXLOVID) 3 tablet  Status:  Discontinued        3 tablet Oral 2 times daily 10/01/21 2331 10/01/21 2342   10/01/21 2345  cefTRIAXone (ROCEPHIN) 1 g in sodium chloride 0.9 % 100 mL IVPB        1 g 200 mL/hr over 30 Minutes Intravenous Every 24 hours 10/01/21 2334  10/01/21 2345  nirmatrelvir/ritonavir EUA (renal dosing) (PAXLOVID) 2 tablet        2 tablet Oral 2 times daily 10/01/21 2342 10/06/21 2159       Subjective: Patient seen and examined at the bedside this morning.  Hemodynamically stable during my evaluation.  Very deconditioned, chronically looking, weak.  Alert and  oriented, denies any specific complaints today.  Objective: Vitals:   10/02/21 0245 10/02/21 0300 10/02/21 0330 10/02/21 0345  BP: (!) 113/56 (!) 105/58 (!) 115/57 103/71  Pulse: 84 82 95 86  Resp: (!) 25 (!) 27 (!) 24 (!) 27  Temp:      TempSrc:      SpO2: 94% 94% 96% 94%  Weight:       No intake or output data in the 24 hours ending 10/02/21 0812 Filed Weights   10/01/21 1809  Weight: 74.6 kg    Examination:  General exam: Malnourished, chronically ill looking, weak  HEENT: PERRL Respiratory system:  no wheezes or crackles  Cardiovascular system: S1 & S2 heard, RRR.  Gastrointestinal system: Abdomen is nondistended, soft and nontender. Central nervous system: Alert and oriented Extremities: No edema, no clubbing ,no cyanosis Skin: No rashes, no ulcers,no icterus     Data Reviewed: I have personally reviewed following labs and imaging studies  CBC: Recent Labs  Lab 10/01/21 1811 10/02/21 0430  WBC 8.7 8.0  NEUTROABS 7.3  --   HGB 11.9*   13.3 11.7*  HCT 39.7   39.0 37.0  MCV 92.3 90.0  PLT 239 259   Basic Metabolic Panel: Recent Labs  Lab 10/01/21 1811 10/02/21 0430  NA 136   138 137  K 3.7   3.9 3.5  CL 105   106 106  CO2 22 21*  GLUCOSE 115*   112* 114*  BUN 14   18 16   CREATININE 1.00   0.90 0.97  CALCIUM 8.9 8.9     Recent Results (from the past 240 hour(s))  Resp Panel by RT-PCR (Flu A&B, Covid) Nasopharyngeal Swab     Status: Abnormal   Collection Time: 10/01/21  6:45 PM   Specimen: Nasopharyngeal Swab; Nasopharyngeal(NP) swabs in vial transport medium  Result Value Ref Range Status   SARS Coronavirus 2 by RT PCR POSITIVE (A) NEGATIVE Final    Comment: (NOTE) SARS-CoV-2 target nucleic acids are DETECTED.  The SARS-CoV-2 RNA is generally detectable in upper respiratory specimens during the acute phase of infection. Positive results are indicative of the presence of the identified virus, but do not rule out bacterial infection or  co-infection with other pathogens not detected by the test. Clinical correlation with patient history and other diagnostic information is necessary to determine patient infection status. The expected result is Negative.  Fact Sheet for Patients: EntrepreneurPulse.com.au  Fact Sheet for Healthcare Providers: IncredibleEmployment.be  This test is not yet approved or cleared by the [place] FDA and  has been authorized for detection and/or diagnosis of SARS-CoV-2 by FDA under an Emergency Use Authorization (EUA).  This EUA will remain in effect (meaning this test can be used) for the duration of  the COVID-19 declaration under Section 564(b)(1) of the A ct, 21 U.S.C. section 360bbb-3(b)(1), unless the authorization is terminated or revoked sooner.     Influenza A by PCR NEGATIVE NEGATIVE Final   Influenza B by PCR NEGATIVE NEGATIVE Final    Comment: (NOTE) The Xpert Xpress SARS-CoV-2/FLU/RSV plus assay is intended as an aid in the diagnosis of influenza from Nasopharyngeal  swab specimens and should not be used as a sole basis for treatment. Nasal washings and aspirates are unacceptable for Xpert Xpress SARS-CoV-2/FLU/RSV testing.  Fact Sheet for Patients: EntrepreneurPulse.com.au  Fact Sheet for Healthcare Providers: IncredibleEmployment.be  This test is not yet approved or cleared by the [place] FDA and has been authorized for detection and/or diagnosis of SARS-CoV-2 by FDA under an Emergency Use Authorization (EUA). This EUA will remain in effect (meaning this test can be used) for the duration of the COVID-19 declaration under Section 564(b)(1) of the Act, 21 U.S.C. section 360bbb-3(b)(1), unless the authorization is terminated or revoked.  Performed at [facility], [place] [address] [place], [place] [address]      Radiology Studies: MR BRAIN WO CONTRAST  Result Date:  10/01/2021 CLINICAL DATA:  Initial evaluation for acute TIA. EXAM: MRI HEAD WITHOUT CONTRAST TECHNIQUE: Multiplanar, multiecho pulse sequences of the brain and surrounding structures were obtained without intravenous contrast. COMPARISON:  Prior CTs from earlier the same day. FINDINGS: Brain: Diffuse prominence of the CSF containing spaces compatible generalized cerebral atrophy. Patchy and confluent T2/FLAIR hyperintensity involving the periventricular deep white matter both cerebral hemispheres as well as the pons, compatible with chronic microvascular ischemic disease, moderately advanced in nature. Few associated remote lacunar infarcts present about the hemispheric cerebral white matter. Remote bilateral parieto-occipital infarcts with associated laminar necrosis noted, better seen on prior CT. Small remote right cerebellar infarct noted. No evidence for acute or subacute ischemia. No acute intracranial hemorrhage. Few scattered chronic micro hemorrhages noted involving the supratentorial the vertebral brain, likely hypertensive in nature. No mass lesion, midline shift or mass effect. No hydrocephalus or extra-axial fluid collection. Pituitary gland suprasellar region normal. Midline structures intact. Vascular: Major intracranial vascular flow voids are maintained. Skull and upper cervical spine: Craniocervical junction within normal limits. Sequelae of prior left occipital craniotomy. No scalp soft tissue abnormality. Sinuses/Orbits: Globes and orbital soft tissues within normal limits. Mild scattered mucosal thickening noted within the ethmoidal air cells. Paranasal sinuses are otherwise clear. Trace right mastoid effusion, of doubtful significance. Other: None. IMPRESSION: 1. No acute intracranial abnormality. 2. 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 3. Moderately advanced chronic microvascular ischemic disease, with a few remote lacunar  infarcts about the hemispheric cerebral white matter and cerebellum. Electronically Signed   By: Jeannine Boga M.D.   On: 10/01/2021 20:25   DG Chest Portable 1 View  Result Date: 10/01/2021 CLINICAL DATA:  concern for pna EXAM: PORTABLE CHEST 1 VIEW COMPARISON:  Chest x-ray 08/22/2021, CT chest 09/12/2021 FINDINGS: The heart and mediastinal contours are within normal limits. Bilateral hilar prominence consistent with known peribronchovascular consolidation and fibrosis best visualized on CT chest 09/12/2021. Aortic calcification. Retrocardiac airspace opacity. No pulmonary edema. Persistent at least trace volume left pleural effusion. No pneumothorax. No acute osseous abnormality.  Old healed left rib fractures. IMPRESSION: 1. Retrocardiac airspace opacity and persistent at least trace volume left pleural effusion. Followup PA and lateral chest X-ray is recommended in 3-4 weeks following therapy to ensure resolution. 2.  Aortic Atherosclerosis (ICD10-I70.0). Electronically Signed   By: Iven Finn M.D.   On: 10/01/2021 19:06   CT HEAD CODE STROKE WO CONTRAST  Result Date: 10/01/2021 CLINICAL DATA:  Code stroke. Neuro deficit, acute, stroke suspected. EXAM: CT HEAD WITHOUT CONTRAST TECHNIQUE: Contiguous axial images were obtained from the base of the skull through the vertex without intravenous contrast. RADIATION DOSE REDUCTION: This exam was performed according  to the departmental dose-optimization program which includes automated exposure control, adjustment of the mA and/or kV according to patient size and/or use of iterative reconstruction technique. COMPARISON:  03/22/2020 FINDINGS: Brain: No acute finding or change since the prior exam. There is cerebellar atrophy without focal insult. Patient has had previous left parieto-occipital craniotomy. There is volume loss affecting the cortex in both posterior parietal/occipital regions, more extensive on the left than the right, with laminar  necrosis and cortical calcification. There chronic small-vessel ischemic changes elsewhere affecting the cerebral hemispheric white matter. Vascular: There is atherosclerotic calcification of the major vessels at the base of the brain. Skull: Otherwise negative Sinuses/Orbits: Clear/normal Other: None ASPECTS ([place] Stroke Program Early CT Score) - Ganglionic level infarction (caudate, lentiform nuclei, internal capsule, insula, M1-M3 cortex): 7 - Supraganglionic infarction (M4-M6 cortex): 3 Total score (0-10 with 10 being normal): 10 IMPRESSION: 1. No acute CT finding. Distant left posterior parietal craniotomy. Old bilateral parieto-occipital cortical infarctions with laminar necrosis and calcification. 2. ASPECTS is 10 3. These results were communicated to Dr. Leonel Ramsay at 6:19 pm on 10/01/2021 by text page via the [facility] messaging system. Electronically Signed   By: Nelson Chimes M.D.   On: 10/01/2021 18:20   CT ANGIO HEAD NECK W WO CM (CODE STROKE)  Result Date: 10/01/2021 CLINICAL DATA:  Stroke. EXAM: CT ANGIOGRAPHY HEAD AND NECK TECHNIQUE: Multidetector CT imaging of the head and neck was performed using the standard protocol during bolus administration of intravenous contrast. Multiplanar CT image reconstructions and MIPs were obtained to evaluate the vascular anatomy. Carotid stenosis measurements (when applicable) are obtained utilizing NASCET criteria, using the distal internal carotid diameter as the denominator. RADIATION DOSE REDUCTION: This exam was performed according to the departmental dose-optimization program which includes automated exposure control, adjustment of the mA and/or kV according to patient size and/or use of iterative reconstruction technique. CONTRAST:  92mL OMNIPAQUE IOHEXOL 350 MG/ML SOLN COMPARISON:  Soft tissue neck CT 12/29/2019.  Chest CT 09/12/2021. FINDINGS: CTA NECK FINDINGS Aortic arch: Standard 3 vessel aortic arch with mild atherosclerotic plaque. No significant  arch vessel origin stenosis. Right carotid system: Patent with a small amount of calcified plaque at the carotid bifurcation. No evidence of a significant stenosis or dissection. Left carotid system: Patent with minimal calcified plaque at the carotid bifurcation. No evidence of a significant stenosis or dissection. Vertebral arteries: Patent and codominant without evidence a significant stenosis or dissection. Skeleton: No acute osseous abnormality or suspicious osseous lesion. Other neck: Fatty replacement of the right parotid gland. Status post right hemithyroidectomy. No evidence of cervical lymphadenopathy. Upper chest: Partially visualized small pericardial effusion, left hemithorax volume loss, and small left pleural effusion as shown on prior chest CT. Review of the MIP images confirms the above findings CTA HEAD FINDINGS Anterior circulation: The internal carotid arteries are widely patent from skull base to carotid termini. ACAs and MCAs are patent without evidence of a proximal branch occlusion or significant proximal stenosis. No aneurysm is identified. Posterior circulation: Intracranial vertebral arteries are widely patent to the basilar. The left PICA and right AICA appear dominant. Patent SCA origins are seen bilaterally. The basilar artery is widely patent. 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 Both PCAs are patent without evidence of a significant proximal stenosis. No aneurysm is identified. Venous sinuses: As permitted by contrast timing, patent. Anatomic variants: Fetal origin of the PCAs. Review of the MIP images confirms the above findings IMPRESSION: 1.  No large vessel occlusion or significant stenosis in the head or neck. 2.  Aortic Atherosclerosis (ICD10-I70.0). These results were communicated to Dr. Leonel Ramsay at 6:38 pm on 10/01/2021 by text page via the [facility] messaging system. Electronically Signed   By: Logan Bores M.D.   On: 10/01/2021 18:48    Scheduled Meds:  donepezil  10 mg Oral QHS   enoxaparin (LOVENOX) injection  40 mg Subcutaneous Q24H   nirmatrelvir/ritonavir EUA (renal dosing)  2 tablet Oral BID   Continuous Infusions:  cefTRIAXone (ROCEPHIN)  IV Stopped (10/02/21 0159)     LOS: 0 days   Shelly Coss, MD [facility] P2/13/2023, 8:12 AM

## 2021-10-02 NOTE — Progress Notes (Signed)
Neurology Addendum:  MRI is neg for new CVA. She can follow up oupt.

## 2021-10-02 NOTE — Plan of Care (Signed)

## 2021-10-02 NOTE — ED Notes (Signed)
Breakfast orders Placed °

## 2021-10-02 NOTE — ED Notes (Signed)
Pt was incontinent of BM. Cleaned pt and applied a clean brief.

## 2021-10-03 DIAGNOSIS — G459 Transient cerebral ischemic attack, unspecified: Secondary | ICD-10-CM | POA: Diagnosis present

## 2021-10-03 DIAGNOSIS — Z515 Encounter for palliative care: Secondary | ICD-10-CM | POA: Diagnosis not present

## 2021-10-03 DIAGNOSIS — R32 Unspecified urinary incontinence: Secondary | ICD-10-CM | POA: Diagnosis present

## 2021-10-03 DIAGNOSIS — R471 Dysarthria and anarthria: Secondary | ICD-10-CM | POA: Diagnosis present

## 2021-10-03 DIAGNOSIS — E43 Unspecified severe protein-calorie malnutrition: Secondary | ICD-10-CM | POA: Diagnosis present

## 2021-10-03 DIAGNOSIS — Z7189 Other specified counseling: Secondary | ICD-10-CM | POA: Diagnosis not present

## 2021-10-03 DIAGNOSIS — I2510 Atherosclerotic heart disease of native coronary artery without angina pectoris: Secondary | ICD-10-CM | POA: Diagnosis present

## 2021-10-03 DIAGNOSIS — K529 Noninfective gastroenteritis and colitis, unspecified: Secondary | ICD-10-CM | POA: Diagnosis present

## 2021-10-03 DIAGNOSIS — I428 Other cardiomyopathies: Secondary | ICD-10-CM | POA: Diagnosis present

## 2021-10-03 DIAGNOSIS — G9341 Metabolic encephalopathy: Secondary | ICD-10-CM | POA: Diagnosis present

## 2021-10-03 DIAGNOSIS — N390 Urinary tract infection, site not specified: Secondary | ICD-10-CM | POA: Diagnosis present

## 2021-10-03 DIAGNOSIS — I480 Paroxysmal atrial fibrillation: Secondary | ICD-10-CM | POA: Diagnosis present

## 2021-10-03 DIAGNOSIS — B9620 Unspecified Escherichia coli [E. coli] as the cause of diseases classified elsewhere: Secondary | ICD-10-CM | POA: Diagnosis present

## 2021-10-03 DIAGNOSIS — U071 COVID-19: Secondary | ICD-10-CM | POA: Diagnosis present

## 2021-10-03 DIAGNOSIS — R627 Adult failure to thrive: Secondary | ICD-10-CM | POA: Diagnosis present

## 2021-10-03 DIAGNOSIS — F419 Anxiety disorder, unspecified: Secondary | ICD-10-CM | POA: Diagnosis present

## 2021-10-03 DIAGNOSIS — R29810 Facial weakness: Secondary | ICD-10-CM | POA: Diagnosis present

## 2021-10-03 DIAGNOSIS — L89892 Pressure ulcer of other site, stage 2: Secondary | ICD-10-CM | POA: Diagnosis present

## 2021-10-03 DIAGNOSIS — Z66 Do not resuscitate: Secondary | ICD-10-CM | POA: Diagnosis present

## 2021-10-03 DIAGNOSIS — I110 Hypertensive heart disease with heart failure: Secondary | ICD-10-CM | POA: Diagnosis present

## 2021-10-03 DIAGNOSIS — I5042 Chronic combined systolic (congestive) and diastolic (congestive) heart failure: Secondary | ICD-10-CM | POA: Diagnosis present

## 2021-10-03 DIAGNOSIS — C7931 Secondary malignant neoplasm of brain: Secondary | ICD-10-CM | POA: Diagnosis present

## 2021-10-03 DIAGNOSIS — C779 Secondary and unspecified malignant neoplasm of lymph node, unspecified: Secondary | ICD-10-CM | POA: Diagnosis present

## 2021-10-03 DIAGNOSIS — F0390 Unspecified dementia without behavioral disturbance: Secondary | ICD-10-CM | POA: Diagnosis present

## 2021-10-03 DIAGNOSIS — M810 Age-related osteoporosis without current pathological fracture: Secondary | ICD-10-CM | POA: Diagnosis present

## 2021-10-03 DIAGNOSIS — E890 Postprocedural hypothyroidism: Secondary | ICD-10-CM | POA: Diagnosis present

## 2021-10-03 LAB — C DIFFICILE QUICK SCREEN W PCR REFLEX
C Diff antigen: NEGATIVE
C Diff interpretation: NOT DETECTED
C Diff toxin: NEGATIVE

## 2021-10-03 MED ORDER — MELATONIN 3 MG PO TABS: 3.0000 mg | ORAL_TABLET | Freq: Every evening | ORAL | Status: DC | PRN

## 2021-10-03 MED ORDER — ACETAMINOPHEN 325 MG PO TABS
650.0000 mg | ORAL_TABLET | Freq: Four times a day (QID) | ORAL | Status: DC | PRN
  Administered 2021-10-06 – 2021-10-10 (×2): 650 mg via ORAL
  Filled 2021-10-03 (×2): qty 2

## 2021-10-03 MED ORDER — SACCHAROMYCES BOULARDII 250 MG PO CAPS
250.0000 mg | ORAL_CAPSULE | Freq: Two times a day (BID) | ORAL | Status: DC
  Administered 2021-10-03 – 2021-10-10 (×14): 250 mg via ORAL
  Filled 2021-10-03 (×15): qty 1

## 2021-10-03 MED ORDER — ONDANSETRON HCL 4 MG/2ML IJ SOLN
4.0000 mg | Freq: Four times a day (QID) | INTRAMUSCULAR | Status: DC | PRN
  Administered 2021-10-03: 4 mg via INTRAVENOUS
  Filled 2021-10-03: qty 2

## 2021-10-03 MED ORDER — ENSURE ENLIVE PO LIQD
237.0000 mL | Freq: Two times a day (BID) | ORAL | Status: DC
  Administered 2021-10-03 – 2021-10-10 (×13): 237 mL via ORAL

## 2021-10-03 MED ORDER — LOPERAMIDE HCL 2 MG PO CAPS
2.0000 mg | ORAL_CAPSULE | ORAL | Status: DC | PRN
  Administered 2021-10-03 – 2021-10-08 (×9): 2 mg via ORAL
  Filled 2021-10-03 (×9): qty 1

## 2021-10-03 MED ORDER — ZINC OXIDE 40 % EX OINT
1.0000 "application " | TOPICAL_OINTMENT | CUTANEOUS | Status: DC | PRN
  Administered 2021-10-05: 1 via TOPICAL
  Filled 2021-10-03 (×3): qty 57

## 2021-10-03 NOTE — Progress Notes (Addendum)
Palliative:  HPI: 76 y.o. 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 Initial concern for stroke that has been ruled out.    I met today with Valerie [name] and no family at bedside. She is lying in bed. She complains of diarrhea and how everything has been going straight through her. This was happening prior to infections and part of the reason she stopped her Tarceva. I discussed with her and it does not seem she has had any stool studies (discussed with Dr. Tawanna Solo). I added PRN imodium that she was taking at home (may d/c if stool studies reveal etiology). I will also add some probiotics. I discussed with her that this may not be the answer but we can at least check and try and see what we can do to improve this for her. Her diarrhea is her greatest complaint. She reports that she feels well otherwise. I discussed with her the struggle to remain hydrated with fluid lost in her diarrhea. She is on IV fluids currently.   Overall she reports that she is overall happy with [name] (supplemented with some private care). She wishes she had more assistance but understands some of the limitations. We discussed the benefits of palliative care services in helping to maximize her quality of life. We also discussed the benefits vs limitations of therapy services and when she has had her benefits that she could be eligible for hospice services that could give her more support at her facility. At this time she is telling me that she desires to pursue rehab therapy with a goal to help her to walk again. I encouraged her to continue conversations with her family to consider her needs and what will best benefit her at the time. She  agrees with palliative to follow back at facility.   All questions/concerns addressed. Emotional support provided.   Exam: Alert, oriented. No distress. Thin, frail. Generalized chronic weakness. Breathing regular, unlabored. Abd soft. + diarrhea.   Plan: - She wishes to continue to therapy.  - Recommend outpt palliative follow up.  - PRN medications added for comfort.  - Probiotics added for diarrhea.   Douglas, NP Palliative Medicine Team Pager [phone] (Please see [email or web address] for schedule) Team Phone [phone]    Greater than 50%  of this time was spent counseling and coordinating care related to the above assessment and plan

## 2021-10-03 NOTE — Progress Notes (Signed)
OT Cancellation Note  Patient Details Name: Valerie [name] MRN: [id number] DOB: 1945-09-27   Cancelled Treatment:   Reason Eval/Treat Not Completed: Other (comment) NP in with pt at this time, OT will follow up next available time  Britt Bottom 10/03/2021, 3:15 PM

## 2021-10-03 NOTE — Evaluation (Signed)
Physical Therapy Evaluation and Discharge Patient Details Name: Valerie [name] MRN: [id number] DOB: 1945/12/06 Today's Date: 10/03/2021  History of Present Illness  76 y.o. female with medical history significant of lung cancer with brain metastasis, paroxysmal atrial fibrillation not on anticoagulation, combined CHF, history of DVT who presents with concerns of left facial droop and dysphagia. 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 Unable to speak with a staff member familiar with patient at her ALF (no one could come to phone). Patient with ?reliability re: her mobility, however states she rarely sat at the EOB and 2 person assist to pivot to wheelchair (I.e. she was in her bed most of the time). She appears to be at her baseline functionally. Staff could likely benefit from use of hoyer lift to make transfers easier, however ?if allowed at this facility or if they already have one. She is not a candidate for therapy or skilled nursing care due to no functional decline. PT is signing off. Thank you for this referral.        Recommendations for follow up therapy are one component of a multi-disciplinary discharge planning process, led by the attending physician.  Recommendations may be updated based on patient status, additional functional criteria and insurance authorization.  Follow Up Recommendations No PT follow up (pt likely at her baseline)    Assistance Recommended at Discharge Intermittent Supervision/Assistance  Patient can return home with the following  Two people to help with walking and/or transfers;A lot of help with bathing/dressing/bathroom;Assistance with cooking/housework;Direct supervision/assist for medications management;Direct supervision/assist for  financial management;Assist for transportation;Help with stairs or ramp for entrance    Equipment Recommendations Other (comment) (?hoyer lift if facility needs/will allow)  Recommendations for Other Services       Functional Status Assessment Patient has not had a recent decline in their functional status     Precautions / Restrictions Precautions Precautions: Fall      Mobility  Bed Mobility Overal bed mobility: Needs Assistance Bed Mobility: Rolling, Sidelying to Sit, Sit to Supine Rolling: Min assist Sidelying to sit: Mod assist, HOB elevated   Sit to supine: Min guard   General bed mobility comments: pt sat EOB with mod assist when LUE involved in changing her gown; when using LUE for support required min assist    Transfers                   General transfer comment: unsafe to attempt with one person    Ambulation/Gait                  Stairs            Wheelchair Mobility    Modified Rankin (Stroke Patients Only)       Balance Overall balance assessment: Needs assistance Sitting-balance support: Bilateral upper extremity supported, Feet supported Sitting balance-Leahy Scale: Poor   Postural control: Left lateral lean                                   Pertinent Vitals/Pain Pain Assessment Pain Assessment: Faces Faces Pain Scale: Hurts whole lot Pain Location: buttocks from diarrhea and skin breakdown Pain Descriptors / Indicators: Burning, Grimacing, Moaning Pain Intervention(s): Limited activity within patient's  tolerance, Monitored during session, Repositioned    Home Living Family/patient expects to be discharged to:: Assisted living                 Home Equipment: Other (comment) (unknown) Additional Comments: Per chart from ALF.    Prior Function Prior Level of Function : Patient poor historian/Family not available             Mobility Comments: pt reports she could sit on EOB and 2 people  could pivot her bed to wheelchair, however "it was not easy" and she reports it only happens "sometimes...not often enough". Attempted to reach staff familiar with pt at [facility] however no one was available to take the call.       Hand Dominance        Extremity/Trunk Assessment   Upper Extremity Assessment Upper Extremity Assessment: Defer to OT evaluation    Lower Extremity Assessment Lower Extremity Assessment: RLE deficits/detail;LLE deficits/detail RLE Deficits / Details: ROM WFL; able to move against gravity to lift leg up onto bed from sitting EOB LLE Deficits / Details: ROM WFL; able to move against gravity to lift leg up onto bed from sitting EOB    Cervical / Trunk Assessment Cervical / Trunk Assessment: Kyphotic;Other exceptions Cervical / Trunk Exceptions: left leaning in sitting  Communication   Communication: No difficulties  Cognition Arousal/Alertness: Awake/alert Behavior During Therapy: WFL for tasks assessed/performed Overall Cognitive Status: No family/caregiver present to determine baseline cognitive functioning                                 General Comments: patient thought she was at ALF, but easily re-oriented to hospital; ?accuracy with her reported mobility level PTA        General Comments General comments (skin integrity, edema, etc.): Patient rolled Rt and left to come off bedpan and for pericare prior to sitting EOB    Exercises     Assessment/Plan    PT Assessment Patient does not need any further PT services  PT Problem List         PT Treatment Interventions      PT Goals (Current goals can be found in the Care Plan section)  Acute Rehab PT Goals Patient Stated Goal: get up into wheelchair more often PT Goal Formulation: All assessment and education complete, DC therapy    Frequency       Co-evaluation               AM-PAC PT "6 Clicks" Mobility  Outcome Measure Help needed turning from your  back to your side while in a flat bed without using bedrails?: A Lot Help needed moving from lying on your back to sitting on the side of a flat bed without using bedrails?: A Lot Help needed moving to and from a bed to a chair (including a wheelchair)?: Total Help needed standing up from a chair using your arms (e.g., wheelchair or bedside chair)?: Total Help needed to walk in hospital room?: Total Help needed climbing 3-5 steps with a railing? : Total 6 Click Score: 8    End of Session   Activity Tolerance: Patient limited by fatigue Patient left: in bed;with call bell/phone within reach;with bed alarm set Nurse Communication: Mobility status;Need for lift equipment PT Visit Diagnosis: Other symptoms and signs involving the nervous system (W41.324)    Time: 4010-2725 PT Time Calculation (min) (ACUTE ONLY): 33  min   Charges:   PT Evaluation $PT Eval Moderate Complexity: 1 Mod PT Treatments $Therapeutic Activity: 8-22 mins         Arby Barrette, PT Acute Rehabilitation Services  Pager [phone] Office [phone]   Rexanne Mano 10/03/2021, 4:39 PM

## 2021-10-03 NOTE — Progress Notes (Signed)
Initial Nutrition Assessment  DOCUMENTATION CODES:   Not applicable  INTERVENTION:   Liberalize diet to regular.  Ensure Enlive po BID, each supplement provides 350 kcal and 20 grams of protein.  NUTRITION DIAGNOSIS:   Increased nutrient needs related to catabolic illness ([id number], metastatic cancer) as evidenced by estimated needs.  GOAL:   Patient will meet greater than or equal to 90% of their needs  MONITOR:   PO intake, Supplement acceptance  REASON FOR ASSESSMENT:   Consult Assessment of nutrition requirement/status  ASSESSMENT:   76 yo female admitted with L facial droop, dysphagia. Stroke was ruled out. Found to be COVID positive. UA suggestive of UTI. PMH includes metastatic non-small cell lung cancer with brain mets, PAF, CHF, DVT, dementia.  Patient recently stopped chemo treatment d/t side effects (diarrhea) affecting her QOL. Palliative Care team is following to assist patient and family with potential need for Hospice involvement. Prognosis is poor d/t declining functional status and overall FTT.   Currently on a heart healthy diet. Meal intakes not documented.  Discussed liberalizing diet with attending physician, okay for a regular diet, given poor prognosis.  Patient resting. Unable to speak with her at this time or complete NFPE.   Labs reviewed. Ionized calcium 1.13 (L)  Medications reviewed and include Paxlovid, IV Rocephin. IVF: NS at 75 ml/h  Weight history reviewed. Weight has increased by 6% over the past 5 months.  Diet Order:   Diet Order             Diet Heart Room service appropriate? No; Fluid consistency: Thin  Diet effective now                   EDUCATION NEEDS:   No education needs have been identified at this time  Skin:  Skin Assessment: Skin Integrity Issues: Skin Integrity Issues:: Stage II, Other (Comment) Stage II: rectum Other: MASD L breast  Last BM:  2/14 type 7  Height:   Ht Readings from Last 1  Encounters:  07/18/21 5\' 8"  (1.727 m)    Weight:   Wt Readings from Last 1 Encounters:  10/01/21 74.6 kg    BMI:  Body mass index is 25.01 kg/m.  Estimated Nutritional Needs:   Kcal:  1900-2100  Protein:  100-115 gm  Fluid:  1.8-2 L    Lucas Mallow RD, LDN, CNSC Please refer to Amion for contact information.

## 2021-10-03 NOTE — Care Management Obs Status (Signed)
[facility] NOTIFICATION   Patient Details  Name: Valerie [name] MRN: [id number] Date of Birth: 1946-04-22   Medicare Observation Status Notification Given:  Yes    Benard Halsted, [place] 10/03/2021, 10:21 AM

## 2021-10-03 NOTE — Progress Notes (Addendum)
PROGRESS NOTE  Valerie [name]  ZOX:[id number] DOB: 12/16/1945 DOA: 10/01/2021 PCP: Roetta Sessions, NP   Brief Narrative: Patient is a 76 year old female with history of metastatic non-small cell lung cancer currently on not on any treatment, paroxysmal A-fib not on anticoagulation, combined systolic/diastolic congestive heart failure, history of DVT who presents from ALF with concern of left facial droop, dysphagia.  She was found to be confused with slurred speech when family went to visit her at assisted living facility.  She looked more weaker and  was brought to the emergency department.  Presented as a code stroke.  CT head, MRI of the brain did not show any acute intracranial normalities so a stroke has been ruled out.  UA was suggestive of UTI, also found to be COVID-positive.  Waiting for PT/OT, palliative care evaluation and also the final urine culture report before discharge planning.  Assessment & Plan:  Principal Problem:   Acute metabolic encephalopathy Active Problems:   Dementia ([place])   Congestive heart failure (CHF) ([facility])   DNR (do not resuscitate)   PAF (paroxysmal atrial fibrillation) ([place])   UTI (urinary tract infection)   COVID-19 virus infection   Assessment and Plan:  Altered mental status/acute metabolic encephalopathy: Likely secondary to COVID, UTI.  Presented with concern for left facial droop, dysarthria, confusion.  Brain imaging is negative for acute findings.  Stroke has been ruled out.  Currently she is alert and oriented  Urinary tract infection: History of recurrent UTI.  UA was suggestive of UTI.  Follow-up urine culture, showing gram-negative rods.  Continue ceftriaxone, follow-up final culture report  COVID infection: Respiratory status is stable.  Chest x-ray Showed retrocardiac opacity, not sure if there is pneumonia or lung cancer.  Since she was asymptomatic, not started on any treatment but currently on Paxlovid after discussion with  family  Paroxysmal A-fib: Not on anticoagulation due to history of hematuria.  Currently in normal sinus rhythm.  Monitor on telemetry  History of coronary artery disease: Not on antiplatelet therapy.  No anginal symptoms  History of combined systolic/diastolic congestive heart failure: Currently euvolemic.  Last echo on 03/2020 showed EF of 25 to 81%, grade 1 diastolic dysfunction.  History of dementia: On Aricept  Severe protein calorie malnutrition: We have consulted nutritionist consulted  Chronic diarrhea: Low suspicious for infectious etiology,but will check C diff and GI pathogen panel. Imodium as needed for now  Debility/deconditioning: We will request for PT/OT evaluation.  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 She was on Tarceva but currently on hold because of side affects.  Oncology has recommended palliative care/hospice referral.  We will consult  palliative care.  Was following with Dr. Julien Nordmann. Daughter interested on discussion with palliative care, she is open on hospice, palliative approach.            DVT prophylaxis:enoxaparin (LOVENOX) injection 40 mg Start: 10/02/21 1000     Code Status: DNR  Family Communication: Daughter on phone on 2/13  Patient status: Observation  Patient is from : ALF  Anticipated discharge to: ALF versus skilled nursing facility   Estimated DC date: 1-2 days   Consultants: None  Procedures: None  Antimicrobials:  Anti-infectives (From admission, onward)    Start     Dose/Rate Route Frequency Ordered Stop   10/01/21 2345  nirmatrelvir/ritonavir EUA (PAXLOVID) 3 tablet  Status:  Discontinued        3 tablet Oral 2 times daily  10/01/21 2331 10/01/21 2342   10/01/21 2345  cefTRIAXone (ROCEPHIN) 1 g in sodium chloride 0.9 % 100 mL IVPB        1 g 200 mL/hr over 30 Minutes Intravenous Every 24 hours 10/01/21 2334     10/01/21 2345  nirmatrelvir/ritonavir EUA (renal  dosing) (PAXLOVID) 2 tablet        2 tablet Oral 2 times daily 10/01/21 2342 10/07/21 0359       Subjective: Patient seen and examined at the bedside this morning.  Hemodynamically stable.  She looks much better today.  She is not confused, alert and oriented.  Denies any new complaints today.  Her nausea is better today.  She expresses interest on going back to the ALF when she is ready  Objective: Vitals:   10/02/21 2000 10/03/21 0000 10/03/21 0400 10/03/21 1142  BP: (!) 122/58 (!) 105/53 126/69   Pulse: 81 68 75 75  Resp: 16 20 10    Temp: 98.3 F (36.8 C) 98.4 F (36.9 C) 98.3 F (36.8 C)   TempSrc: Oral Oral Oral   SpO2: 94% 98% 98% 98%  Weight:        Intake/Output Summary (Last 24 hours) at 10/03/2021 1146 Last data filed at 10/03/2021 1143 Gross per 24 hour  Intake 288.93 ml  Output 600 ml  Net -311.07 ml   Filed Weights   10/01/21 1809  Weight: 74.6 kg    Examination:  General exam: Very deconditioned, chronically ill looking, weak, malnourished HEENT: PERRL Respiratory system:  no wheezes or crackles  Cardiovascular system: S1 & S2 heard, RRR.  Gastrointestinal system: Abdomen is nondistended, soft and nontender. Central nervous system: Alert and oriented Extremities: No edema, no clubbing ,no cyanosis Skin: No rashes, no ulcers,no icterus    Data Reviewed: I have personally reviewed following labs and imaging studies  CBC: Recent Labs  Lab 10/01/21 1811 10/02/21 0430  WBC 8.7 8.0  NEUTROABS 7.3  --   HGB 11.9*   13.3 11.7*  HCT 39.7   39.0 37.0  MCV 92.3 90.0  PLT 239 701   Basic Metabolic Panel: Recent Labs  Lab 10/01/21 1811 10/02/21 0430  NA 136   138 137  K 3.7   3.9 3.5  CL 105   106 106  CO2 22 21*  GLUCOSE 115*   112* 114*  BUN 14   18 16   CREATININE 1.00   0.90 0.97  CALCIUM 8.9 8.9     Recent Results (from the past 240 hour(s))  Resp Panel by RT-PCR (Flu A&B, Covid) Nasopharyngeal Swab     Status: Abnormal   Collection  Time: 10/01/21  6:45 PM   Specimen: Nasopharyngeal Swab; Nasopharyngeal(NP) swabs in vial transport medium  Result Value Ref Range Status   SARS Coronavirus 2 by RT PCR POSITIVE (A) NEGATIVE Final    Comment: (NOTE) SARS-CoV-2 target nucleic acids are DETECTED.  The SARS-CoV-2 RNA is generally detectable in upper respiratory specimens during the acute phase of infection. Positive results are indicative of the presence of the identified virus, but do not rule out bacterial infection or co-infection with other pathogens not detected by the test. Clinical correlation with patient history and other diagnostic information is necessary to determine patient infection status. The expected result is Negative.  Fact Sheet for Patients: EntrepreneurPulse.com.au  Fact Sheet for Healthcare Providers: IncredibleEmployment.be  This test is not yet approved or cleared by the [place] FDA and  has been authorized for detection and/or diagnosis of  SARS-CoV-2 by FDA under an Emergency Use Authorization (EUA).  This EUA will remain in effect (meaning this test can be used) for the duration of  the COVID-19 declaration under Section 564(b)(1) of the A ct, 21 U.S.C. section 360bbb-3(b)(1), unless the authorization is terminated or revoked sooner.     Influenza A by PCR NEGATIVE NEGATIVE Final   Influenza B by PCR NEGATIVE NEGATIVE Final    Comment: (NOTE) The Xpert Xpress SARS-CoV-2/FLU/RSV plus assay is intended as an aid in the diagnosis of influenza from Nasopharyngeal swab specimens and should not be used as a sole basis for treatment. Nasal washings and aspirates are unacceptable for Xpert Xpress SARS-CoV-2/FLU/RSV testing.  Fact Sheet for Patients: EntrepreneurPulse.com.au  Fact Sheet for Healthcare Providers: IncredibleEmployment.be  This test is not yet approved or cleared by the [place] FDA and has been  authorized for detection and/or diagnosis of SARS-CoV-2 by FDA under an Emergency Use Authorization (EUA). This EUA will remain in effect (meaning this test can be used) for the duration of the COVID-19 declaration under Section 564(b)(1) of the Act, 21 U.S.C. section 360bbb-3(b)(1), unless the authorization is terminated or revoked.  Performed at [facility], [place] [address], [place], [place] [address]   Urine Culture     Status: Abnormal (Preliminary result)   Collection Time: 10/01/21  9:11 PM   Specimen: Urine, Clean Catch  Result Value Ref Range Status   Specimen Description URINE, CLEAN CATCH  Final   Special Requests   Final    added 2111 Performed at [facility], [place] [address], [place], [place] [address]    Culture >=100,000 COLONIES/mL GRAM NEGATIVE RODS (A)  Final   Report Status PENDING  Incomplete     Radiology Studies: MR BRAIN WO CONTRAST  Result Date: 10/01/2021 CLINICAL DATA:  Initial evaluation for acute TIA. EXAM: MRI HEAD WITHOUT CONTRAST TECHNIQUE: Multiplanar, multiecho pulse sequences of the brain and surrounding structures were obtained without intravenous contrast. COMPARISON:  Prior CTs from earlier the same day. FINDINGS: Brain: Diffuse prominence of the CSF containing spaces compatible generalized cerebral atrophy. Patchy and confluent T2/FLAIR hyperintensity involving the periventricular deep white matter both cerebral hemispheres as well as the pons, compatible with chronic microvascular ischemic disease, moderately advanced in nature. Few associated remote lacunar infarcts present about the hemispheric cerebral white matter. Remote bilateral parieto-occipital infarcts with associated laminar necrosis noted, better seen on prior CT. Small remote right cerebellar infarct noted. No evidence for acute or subacute ischemia. No acute intracranial hemorrhage. Few scattered chronic micro hemorrhages noted involving the supratentorial the vertebral brain,  likely hypertensive in nature. No mass lesion, midline shift or mass effect. No hydrocephalus or extra-axial fluid collection. Pituitary gland suprasellar region normal. Midline structures intact. Vascular: Major intracranial vascular flow voids are maintained. Skull and upper cervical spine: Craniocervical junction within normal limits. Sequelae of prior left occipital craniotomy. No scalp soft tissue abnormality. Sinuses/Orbits: Globes and orbital soft tissues within normal limits. Mild scattered mucosal thickening noted within the ethmoidal air cells. Paranasal sinuses are otherwise clear. Trace right mastoid effusion, of doubtful significance. Other: None. IMPRESSION: 1. No acute intracranial abnormality. 2. 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 3. Moderately advanced chronic microvascular ischemic disease, with a few remote lacunar infarcts about the hemispheric cerebral white matter and cerebellum. Electronically Signed   By: Jeannine Boga M.D.   On: 10/01/2021 20:25   DG Chest Portable 1 View  Result Date:  10/01/2021 CLINICAL DATA:  concern for pna EXAM: PORTABLE CHEST 1 VIEW COMPARISON:  Chest x-ray 08/22/2021, CT chest 09/12/2021 FINDINGS: The heart and mediastinal contours are within normal limits. Bilateral hilar prominence consistent with known peribronchovascular consolidation and fibrosis best visualized on CT chest 09/12/2021. Aortic calcification. Retrocardiac airspace opacity. No pulmonary edema. Persistent at least trace volume left pleural effusion. No pneumothorax. No acute osseous abnormality.  Old healed left rib fractures. IMPRESSION: 1. Retrocardiac airspace opacity and persistent at least trace volume left pleural effusion. Followup PA and lateral chest X-ray is recommended in 3-4 weeks following therapy to ensure resolution. 2.  Aortic Atherosclerosis (ICD10-I70.0). Electronically Signed   By: Iven Finn M.D.    On: 10/01/2021 19:06   CT HEAD CODE STROKE WO CONTRAST  Result Date: 10/01/2021 CLINICAL DATA:  Code stroke. Neuro deficit, acute, stroke suspected. EXAM: CT HEAD WITHOUT CONTRAST TECHNIQUE: Contiguous axial images were obtained from the base of the skull through the vertex without intravenous contrast. RADIATION DOSE REDUCTION: This exam was performed according to the departmental dose-optimization program which includes automated exposure control, adjustment of the mA and/or kV according to patient size and/or use of iterative reconstruction technique. COMPARISON:  03/22/2020 FINDINGS: Brain: No acute finding or change since the prior exam. There is cerebellar atrophy without focal insult. Patient has had previous left parieto-occipital craniotomy. There is volume loss affecting the cortex in both posterior parietal/occipital regions, more extensive on the left than the right, with laminar necrosis and cortical calcification. There chronic small-vessel ischemic changes elsewhere affecting the cerebral hemispheric white matter. Vascular: There is atherosclerotic calcification of the major vessels at the base of the brain. Skull: Otherwise negative Sinuses/Orbits: Clear/normal Other: None ASPECTS ([place] Stroke Program Early CT Score) - Ganglionic level infarction (caudate, lentiform nuclei, internal capsule, insula, M1-M3 cortex): 7 - Supraganglionic infarction (M4-M6 cortex): 3 Total score (0-10 with 10 being normal): 10 IMPRESSION: 1. No acute CT finding. Distant left posterior parietal craniotomy. Old bilateral parieto-occipital cortical infarctions with laminar necrosis and calcification. 2. ASPECTS is 10 3. These results were communicated to Dr. Leonel Ramsay at 6:19 pm on 10/01/2021 by text page via the [facility] messaging system. Electronically Signed   By: Nelson Chimes M.D.   On: 10/01/2021 18:20   CT ANGIO HEAD NECK W WO CM (CODE STROKE)  Result Date: 10/01/2021 CLINICAL DATA:  Stroke. EXAM: CT  ANGIOGRAPHY HEAD AND NECK TECHNIQUE: Multidetector CT imaging of the head and neck was performed using the standard protocol during bolus administration of intravenous contrast. Multiplanar CT image reconstructions and MIPs were obtained to evaluate the vascular anatomy. Carotid stenosis measurements (when applicable) are obtained utilizing NASCET criteria, using the distal internal carotid diameter as the denominator. RADIATION DOSE REDUCTION: This exam was performed according to the departmental dose-optimization program which includes automated exposure control, adjustment of the mA and/or kV according to patient size and/or use of iterative reconstruction technique. CONTRAST:  2mL OMNIPAQUE IOHEXOL 350 MG/ML SOLN COMPARISON:  Soft tissue neck CT 12/29/2019.  Chest CT 09/12/2021. FINDINGS: CTA NECK FINDINGS Aortic arch: Standard 3 vessel aortic arch with mild atherosclerotic plaque. No significant arch vessel origin stenosis. Right carotid system: Patent with a small amount of calcified plaque at the carotid bifurcation. No evidence of a significant stenosis or dissection. Left carotid system: Patent with minimal calcified plaque at the carotid bifurcation. No evidence of a significant stenosis or dissection. Vertebral arteries: Patent and codominant without evidence a significant stenosis or dissection. Skeleton: No acute osseous abnormality or suspicious  osseous lesion. Other neck: Fatty replacement of the right parotid gland. Status post right hemithyroidectomy. No evidence of cervical lymphadenopathy. Upper chest: Partially visualized small pericardial effusion, left hemithorax volume loss, and small left pleural effusion as shown on prior chest CT. Review of the MIP images confirms the above findings CTA HEAD FINDINGS Anterior circulation: The internal carotid arteries are widely patent from skull base to carotid termini. ACAs and MCAs are patent without evidence of a proximal branch occlusion or  significant proximal stenosis. No aneurysm is identified. Posterior circulation: Intracranial vertebral arteries are widely patent to the basilar. The left PICA and right AICA appear dominant. Patent SCA origins are seen bilaterally. The basilar artery is widely patent. 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 Both PCAs are patent without evidence of a significant proximal stenosis. No aneurysm is identified. Venous sinuses: As permitted by contrast timing, patent. Anatomic variants: Fetal origin of the PCAs. Review of the MIP images confirms the above findings IMPRESSION: 1. No large vessel occlusion or significant stenosis in the head or neck. 2.  Aortic Atherosclerosis (ICD10-I70.0). These results were communicated to Dr. Leonel Ramsay at 6:38 pm on 10/01/2021 by text page via the [facility] messaging system. Electronically Signed   By: Logan Bores M.D.   On: 10/01/2021 18:48    Scheduled Meds:  donepezil  10 mg Oral QHS   enoxaparin (LOVENOX) injection  40 mg Subcutaneous Q24H   nirmatrelvir/ritonavir EUA (renal dosing)  2 tablet Oral BID   Continuous Infusions:  sodium chloride 75 mL/hr at 10/03/21 0138   cefTRIAXone (ROCEPHIN)  IV 1 g (10/02/21 2204)     LOS: 0 days   Shelly Coss, MD [facility] P2/14/2023, 11:46 AM

## 2021-10-04 LAB — GASTROINTESTINAL PANEL BY PCR, STOOL (REPLACES STOOL CULTURE)

## 2021-10-04 LAB — URINE CULTURE: Culture: >=100000 — AB

## 2021-10-04 MED ORDER — ENSURE ENLIVE PO LIQD: 237.0000 mL | Freq: Two times a day (BID) | ORAL | 12 refills | Status: AC

## 2021-10-04 MED ORDER — CEPHALEXIN 500 MG PO CAPS: 500.0000 mg | ORAL_CAPSULE | Freq: Four times a day (QID) | ORAL | 0 refills | Status: DC

## 2021-10-04 MED ORDER — SACCHAROMYCES BOULARDII 250 MG PO CAPS: 250.0000 mg | ORAL_CAPSULE | Freq: Two times a day (BID) | ORAL | 0 refills | Status: AC

## 2021-10-04 MED ORDER — ONDANSETRON HCL 4 MG PO TABS: 4.0000 mg | ORAL_TABLET | Freq: Three times a day (TID) | ORAL | 0 refills | Status: DC | PRN

## 2021-10-04 MED ORDER — LOPERAMIDE HCL 2 MG PO CAPS: 4.0000 mg | ORAL_CAPSULE | Freq: Three times a day (TID) | ORAL | 0 refills | Status: DC | PRN

## 2021-10-04 MED ORDER — NIRMATRELVIR/RITONAVIR (PAXLOVID) TABLET (RENAL DOSING): 2.0000 | ORAL_TABLET | Freq: Two times a day (BID) | ORAL | 0 refills | Status: DC

## 2021-10-04 NOTE — Progress Notes (Signed)
[facility]) Hospital Liaison note:  Notified via Epic workque by Dr. Shelly Coss of request for [place] services. Will continue to follow for disposition.  Please call with any outpatient palliative questions or concerns.  Thank you for the opportunity to participate in this patient's care.  Thank you, Lorelee Market, LPN [facility] Liaison [phone]

## 2021-10-04 NOTE — Evaluation (Signed)
Occupational Therapy Evaluation Patient Details Name: NOHA MILBERGER MRN: [id number] DOB: [birth year] Today's Date: 10/04/2021   History of Present Illness 76 y.o. female with medical history significant of lung cancer with brain metastasis, paroxysmal atrial fibrillation not on anticoagulation, combined CHF, history of DVT who presents with concerns of left facial droop and dysphagia. CT head and MRI brain no acute findings; COVID+; acute metabolic encephalopathy; Suspect symptoms due to UTI and COVID causing recrudescence of old stroke   Clinical Impression   OT eval initiated and completed. Pt is at baseline level of function with ADls/selfcare and mobility requiring assist form staff at her ALF. All education is completed and no further acute OT services are indicated at this time. OT will sign off      Recommendations for follow up therapy are one component of a multi-disciplinary discharge planning process, led by the attending physician.  Recommendations may be updated based on patient status, additional functional criteria and insurance authorization.   Follow Up Recommendations  No OT follow up    Assistance Recommended at Discharge Intermittent Supervision/Assistance  Patient can return home with the following A lot of help with bathing/dressing/bathroom;Two people to help with walking and/or transfers;Direct supervision/assist for medications management;Assist for transportation;Help with stairs or ramp for entrance    Functional Status Assessment  Patient has not had a recent decline in their functional status (likely at baseline)  Equipment Recommendations  None recommended by OT    Recommendations for Other Services       Precautions / Restrictions Precautions Precautions: Fall Restrictions Weight Bearing Restrictions: No      Mobility Bed Mobility Overal bed mobility: Needs Assistance Bed Mobility: Rolling, Sidelying to Sit, Sit to Supine Rolling: Min  assist Sidelying to sit: Mod assist, HOB elevated   Sit to supine: Min assist        Transfers                   General transfer comment: unsafe to attempt with one person, this is baseline?      Balance Overall balance assessment: Needs assistance Sitting-balance support: Bilateral upper extremity supported, Feet supported Sitting balance-Leahy Scale: Poor   Postural control: Left lateral lean                                 ADL either performed or assessed with clinical judgement   ADL Overall ADL's : At baseline                                       General ADL Comments: assist with ADLs at ALF at baseline     Vision Baseline Vision/History: 1 Wears glasses Ability to See in Adequate Light: 0 Adequate Patient Visual Report: No change from baseline       Perception     Praxis      Pertinent Vitals/Pain Pain Assessment Pain Assessment: Faces Faces Pain Scale: Hurts even more Pain Location: buttocks from diarrhea and skin breakdown Pain Descriptors / Indicators: Burning, Grimacing, Moaning Pain Intervention(s): Monitored during session, Limited activity within patient's tolerance, Repositioned     Hand Dominance Right   Extremity/Trunk Assessment Upper Extremity Assessment Upper Extremity Assessment: Generalized weakness   Lower Extremity Assessment Lower Extremity Assessment: Defer to PT evaluation   Cervical / Trunk Assessment Cervical / Trunk Assessment: Kyphotic;Other exceptions  Cervical / Trunk Exceptions: left leaning in sitting   Communication Communication Communication: No difficulties   Cognition Arousal/Alertness: Awake/alert Behavior During Therapy: WFL for tasks assessed/performed Overall Cognitive Status: No family/caregiver present to determine baseline cognitive functioning                                 General Comments: patient thought she was at ALF     General Comments        Exercises     Shoulder Instructions      Home Living Family/patient expects to be discharged to:: Assisted living                             Home Equipment: Other (comment)   Additional Comments: Per chart from ALF.      Prior Functioning/Environment Prior Level of Function : Patient poor historian/Family not available             Mobility Comments: pt reports she could sit on EOB and 2 people was able to pivot to bed, toilet and wheelchair ADLs Comments: pt reports that she was Ind with UB ADLs, required assist with LB ADLs and toileting        OT Problem List: Decreased activity tolerance;Decreased cognition;Impaired balance (sitting and/or standing);Decreased strength      OT Treatment/Interventions:      OT Goals(Current goals can be found in the care plan section) Acute Rehab OT Goals Patient Stated Goal: none stated OT Goal Formulation: With patient  OT Frequency:      Co-evaluation              AM-PAC OT "6 Clicks" Daily Activity     Outcome Measure Help from another person eating meals?: None Help from another person taking care of personal grooming?: A Little Help from another person toileting, which includes using toliet, bedpan, or urinal?: Total Help from another person bathing (including washing, rinsing, drying)?: A Lot Help from another person to put on and taking off regular upper body clothing?: A Lot Help from another person to put on and taking off regular lower body clothing?: Total 6 Click Score: 13   End of Session    Activity Tolerance: Patient limited by fatigue Patient left: in bed;with call bell/phone within reach;with bed alarm set  OT Visit Diagnosis: Other symptoms and signs involving cognitive function;Other symptoms and signs involving the nervous system (R29.898);Muscle weakness (generalized) (M62.81)                Time: 6226-3335 OT Time Calculation (min): 19 min Charges:  OT General Charges $OT  Visit: 1 Visit OT Evaluation $OT Eval Moderate Complexity: 1 Mod  { Britt Bottom 10/04/2021, 3:02 PM

## 2021-10-04 NOTE — NC FL2 (Signed)
[place] LEVEL OF CARE SCREENING TOOL     IDENTIFICATION  Patient Name: Valerie [name] Birthdate: 1946/06/30 Sex: female Admission Date (Current Location): 10/01/2021  [facility] and [place] Number:  Herbalist and Address:  The . [facility], [place] 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   PAF (paroxysmal atrial fibrillation) ([place]) 07/07/2021   History of gross hematuria 07/07/2021   Atherosclerosis of aorta ([place]) 42/35/3614   Complicated UTI (urinary tract infection) 04/17/2021   Malnutrition of moderate degree 06/13/2020   Rash and nonspecific skin eruption    Metastatic malignant neoplasm ([place])    Failure to thrive in adult    Asymptomatic bacteriuria    Pressure injury of skin 06/03/2020   Cellulitis 06/03/2020   SVT (supraventricular tachycardia) ([place]) 03/26/2020   Normocytic anemia 03/24/2020   Urinary tract infection without hematuria    AMS (altered mental status) 03/23/2020   Hypokalemia 03/23/2020   Prolonged Q-T interval on ECG 03/23/2020   DNR (do not resuscitate) 03/23/2020   MRSA bacteremia 03/23/2020   Acute combined systolic and diastolic heart failure (HCC)    Elevated troponin    Dilated cardiomyopathy ([place])    Nonocclusive coronary atherosclerosis of native coronary artery    Congestive heart failure (CHF) ([place])  02/14/2020   Pyelonephritis 09/10/2019   Acute blood loss anemia 06/12/2019   Closed fracture of distal end of right femur, initial encounter ([place]) 06/11/2019   Closed fracture of lateral portion of right tibial plateau 06/11/2019   History of DVT of lower extremity 06/11/2019   Blurry vision 01/06/2019   Headache 07/24/2016   Cough 08/24/2015   Chronic diarrhea 08/24/2015   Sepsis ([place])    Encounter for antineoplastic chemotherapy 01/31/2015   Urinary retention 10/20/2014   Ileus ([place]) 10/20/2014   Cellulitis of leg, right 10/20/2014   Fracture of right proximal fibula    Ankle fracture 10/02/2014   Ankle fracture 10/02/2014   Lung cancer ([place]) 10/02/2014   Closed fibular fracture 10/02/2014   Dementia ([place]) 10/02/2014   Lower urinary tract infectious disease 10/02/2014   Reactive airway disease 10/02/2014   Fall 10/02/2014   Closed left ankle fracture 10/01/2014   Right fibular fracture 10/01/2014   Lung metastasis ([place])    LESION, ANUS 04/26/2009   DIARRHEA 04/26/2009   Primary cancer of right upper lobe of lung ([place]) 04/22/2009   Malignant neoplasm of brain ([place]) 04/22/2009   VITAMIN D DEFICIENCY 04/22/2009   ANAL FISTULA 04/22/2009   OSTEOPOROSIS 04/22/2009    Orientation RESPIRATION BLADDER Height & Weight     Time, Self, Situation, Place  Normal Incontinent Weight: 164 lb 7.4 oz (74.6 kg) Height:     BEHAVIORAL SYMPTOMS/MOOD NEUROLOGICAL BOWEL NUTRITION STATUS      Incontinent Diet (Regular)  AMBULATORY STATUS COMMUNICATION OF NEEDS Skin   Extensive Assist Verbally PU Stage and  Appropriate Care (MASD on buttocks; Stage II on rectum)                       Personal Care Assistance Level of Assistance  Bathing, Feeding, Dressing Bathing Assistance: Maximum assistance Feeding assistance: Limited assistance Dressing Assistance: Limited assistance     Functional Limitations Info             [place]  PT (By licensed PT)     PT  Frequency: continue previous services at ALF              Contractures Contractures Info: Not present    Additional Factors Info  Code Status, Allergies, Isolation Precautions Code Status Info: DNR Allergies Info: Broccoli (Brassica Oleracea), Fruit Extracts, Sulfa Antibiotics     Isolation Precautions Info: COVID +     Current Medications (10/04/2021):    Discharge Medications: TAKE these medications     Acetaminophen Extra Strength 500 MG tablet Generic drug: acetaminophen Take 1,000 mg by mouth every 8 (eight) hours as needed for moderate pain.    cephALEXin 500 MG capsule Commonly known as: KEFLEX Take 1 capsule (500 mg total) by mouth 4 (four) times daily for 3 days. What changed: when to take this    Desitin 13 % Crea Generic drug: Zinc Oxide Apply 1 application topically 2 (two) times daily. What changed:  how to take this additional instructions    donepezil 10 MG tablet Commonly known as: ARICEPT TAKE 1 TABLET BY MOUTH ONCE DAILY IN THE EVENING    EMOLLIENT EX Apply 1 application topically daily as needed (skin health).    erlotinib 100 MG tablet Commonly known as: TARCEVA Take 100 mg by mouth daily. Take on an empty stomach 1 hour before meals or 2 hours after    feeding supplement Liqd Take 237 mLs by mouth 2 (two) times daily between meals.    loperamide 2 MG capsule Commonly known as: IMODIUM Take 2 capsules (4 mg total) by mouth every 8 (eight) hours as needed (diarrhea). Replaces: LOPERAMIDE HCL PO    Magnesium 200 MG Tabs Take 200 mg by mouth daily.    Multi-Vitamins Tabs Take 1 tablet by mouth every evening.    nirmatrelvir/ritonavir EUA (renal dosing) 10 x 150 MG & 10 x 100MG  Tabs Commonly known as: PAXLOVID Take 2 tablets by mouth 2 (two) times daily for 3 days. Take nirmatrelvir (150 mg) one tablet twice daily for 5 days and ritonavir (100 mg) one tablet twice daily for 5 days.    NON FORMULARY Apply 1 application topically  every 4 (four) hours as needed (for rash to buttocks).    ondansetron 4 MG tablet Commonly known as: Zofran Take 1 tablet (4 mg total) by mouth every 8 (eight) hours as needed for nausea or vomiting.    prochlorperazine 10 MG tablet Commonly known as: COMPAZINE Take 1 tablet (10 mg total) by mouth every 6 (six) hours as needed for nausea or vomiting.    saccharomyces boulardii 250 MG capsule Commonly known as: FLORASTOR Take 1 capsule (250 mg total) by mouth 2 (two) times daily.    vitamin B-12 1000 MCG tablet Commonly known as: CYANOCOBALAMIN Take 1,000 mcg by mouth every evening.    vitamin C 100 MG tablet Take 100 mg by mouth every evening. Gummie    Relevant Imaging Results:  Relevant Lab Results:   Additional Information SSN: [id number]. Add palliative care at ALF.  Benard Halsted,  LCSW

## 2021-10-04 NOTE — Plan of Care (Signed)
°  Problem: Education: Goal: Knowledge of General Education information will improve Description: Including pain rating scale, medication(s)/side effects and non-pharmacologic comfort measures Outcome: Progressing   Problem: Clinical Measurements: Goal: Ability to maintain clinical measurements within normal limits will improve Outcome: Progressing   Problem: Nutrition: Goal: Adequate nutrition will be maintained Outcome: Progressing   Problem: Education: Goal: Knowledge of risk factors and measures for prevention of condition will improve Outcome: Progressing   Problem: Coping: Goal: Psychosocial and spiritual needs will be supported Outcome: Progressing

## 2021-10-04 NOTE — TOC Initial Note (Addendum)
Transition of Care [facility]) - Initial/Assessment Note    Patient Details  Name: Valerie [name] MRN: [id number] Date of Birth: 1946/06/28  [facility]) CM/SW Contact:    Valerie Halsted, LCSW Phone Number: 10/04/2021, 12:02 PM  Clinical Narrative:                 12pm-CSW left voicemail for Valerie [name] to make them aware of patient's discharge today. CSW spoke with patient's daughter, Valerie [name], and she confirmed plan to return to ALF via PTAR as she is at work. CSW will continue attempts to reach ALF.   2pm-CSW left voicemail for Valerie [name] [phone]) at three different extensions. Also left voicemail for Valerie [name] at [phone]. CSW faxed Fl2 and DC Summary to facility in hopes staff will receive it and call CSW back. CSW also sent documents on the hub as facility has Epic access.   Expected Discharge Plan: Assisted Living Barriers to Discharge: No Barriers Identified   Patient Goals and CMS Choice Patient states their goals for this hospitalization and ongoing recovery are:: Return to ALF   Choice offered to / list presented to : Adult Children, Patient  Expected Discharge Plan and Services Expected Discharge Plan: Assisted Living In-house Referral: Clinical Social Work   Post Acute Care Choice: Resumption of Svcs/PTA Provider Living arrangements for the past 2 months: [place]                                      Prior Living Arrangements/Services Living arrangements for the past 2 months: [place] Lives with:: Facility Resident Patient language and need for interpreter reviewed:: Yes Do you feel safe going back to the place where you live?: Yes      Need for Family Participation in Patient Care: Yes (Comment) Care giver support system in place?: Yes (comment) Current home services: DME, Home PT Criminal Activity/Legal Involvement Pertinent to Current Situation/Hospitalization: No - Comment as  needed  Activities of Daily Living      Permission Sought/Granted Permission sought to share information with : Facility Sport and exercise psychologist, Family Supports Permission granted to share information with : Yes, Verbal Permission Granted  Share Information with NAME: Valerie [name]  Permission granted to share info w AGENCY: ALF  Permission granted to share info w Relationship: Daughter  Permission granted to share info w Contact Information: [phone]  Emotional Assessment Appearance:: Appears stated [name] Attitude/Demeanor/Rapport: Engaged Affect (typically observed): Accepting, Appropriate Orientation: : Oriented to Self, Oriented to Place, Oriented to  Time, Oriented to Situation Alcohol / Substance Use: Not Applicable Psych Involvement: No (comment)  Admission diagnosis:  TIA (transient ischemic attack) [G45.9] Encephalopathy [G93.40] Dysarthria [R47.1] COVID-19 [U07.1] Patient Active Problem List   Diagnosis Date Noted   Dysarthria 24/26/8341   Acute metabolic encephalopathy 96/22/2979   UTI (urinary tract infection) 10/01/2021   COVID-19 virus infection 10/01/2021   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   PAF (paroxysmal atrial fibrillation) ([place]) 07/07/2021   History of gross hematuria 07/07/2021   Atherosclerosis of aorta ([place]) 89/21/1941   Complicated UTI (urinary tract infection) 04/17/2021   Malnutrition of moderate degree 06/13/2020   Rash and nonspecific skin eruption    Metastatic malignant neoplasm (HCC)    Failure to thrive in adult    Asymptomatic bacteriuria    Pressure injury of skin 06/03/2020   Cellulitis 06/03/2020   SVT (supraventricular tachycardia) ([place]) 03/26/2020   Normocytic  anemia 03/24/2020   Urinary tract infection without hematuria    AMS (altered mental status) 03/23/2020   Hypokalemia 03/23/2020   Prolonged Q-T interval on ECG 03/23/2020   DNR (do not resuscitate) 03/23/2020   MRSA bacteremia 03/23/2020   Acute combined systolic  and diastolic heart failure (HCC)    Elevated troponin    Dilated cardiomyopathy ([place])    Nonocclusive coronary atherosclerosis of native coronary artery    Congestive heart failure (CHF) ([place]) 02/14/2020   Pyelonephritis 09/10/2019   Acute blood loss anemia 06/12/2019   Closed fracture of distal end of right femur, initial encounter ([place]) 06/11/2019   Closed fracture of lateral portion of right tibial plateau 06/11/2019   History of DVT of lower extremity 06/11/2019   Blurry vision 01/06/2019   Headache 07/24/2016   Cough 08/24/2015   Chronic diarrhea 08/24/2015   Sepsis ([place])    Encounter for antineoplastic chemotherapy 01/31/2015   Urinary retention 10/20/2014   Ileus ([place]) 10/20/2014   Cellulitis of leg, right 10/20/2014   Fracture of right proximal fibula    Ankle fracture 10/02/2014   Ankle fracture 10/02/2014   Lung cancer ([place]) 10/02/2014   Closed fibular fracture 10/02/2014   Dementia ([place]) 10/02/2014   Lower urinary tract infectious disease 10/02/2014   Reactive airway disease 10/02/2014   Fall 10/02/2014   Closed left ankle fracture 10/01/2014   Right fibular fracture 10/01/2014   Lung metastasis ([place])    LESION, ANUS 04/26/2009   DIARRHEA 04/26/2009   Primary cancer of right upper lobe of lung ([place]) 04/22/2009   Malignant neoplasm of brain ([place]) 04/22/2009   VITAMIN D DEFICIENCY 04/22/2009   ANAL FISTULA 04/22/2009   OSTEOPOROSIS 04/22/2009   PCP:  Valerie Sessions, NP Pharmacy:   [facility], [place] [place] [address] Phone: [phone] Fax: [phone]     Social Determinants of Health (SDOH) Interventions    Readmission Risk Interventions Readmission Risk Prevention Plan 02/18/2020  Transportation Screening Complete  PCP or Specialist Appt within 5-7 Days Complete  Home Care Screening Patient refused  Medication Review (RN CM) Complete  Some recent data  might be hidden

## 2021-10-04 NOTE — TOC Progression Note (Signed)
Transition of Care [facility]) - Progression Note    Patient Details  Name: Valerie [name] MRN: [id number] Date of Birth: 1945-12-03  [facility]) CM/SW [place], LCSW Phone Number: 10/04/2021, 3:34 PM  Clinical Narrative:    CSW finally able to reach [place], TEFL teacher at [employer] [phone]). After discussion and CSW pointing out that patient only admitted to inpatient yesterday, she stated that patient could return after she receives the [facility]. She stated she will go check the fax machine and call CSW back to confirm receipt  Valerie [name] called CSW back and stated that she will need to come assess the patient tomorrow morning. She stated that patient was already inappropriate for their facility when she moved in a few months ago and that they likely will recommend a higher level of care for her.   CSW updated patient's daughter, Valerie [name], on the likely need for SNF placement. Per PT, no therapy needs were identified in patient. CSW reached back out to PT to see if patient can be assessed again as daughter reported that patient used to be able to stand with assistance and help with transfers. CSW hopeful to find a skillable need for SNF placement through Medicare. If not approved, patient would be considered long term care and would be private pay. Patient makes $4000/month and would not qualify for Medicaid. [name] expressed understanding.    Expected Discharge Plan: Assisted Living Barriers to Discharge:  (ALF stating they need to assess patient and she will likely not be approved to return)  Expected Discharge Plan and Services Expected Discharge Plan: Assisted Living In-house Referral: Clinical Social Work   Post Acute Care Choice: Resumption of Svcs/PTA Provider Living arrangements for the past 2 months: [place] Expected Discharge Date: 10/04/21                         HH Arranged: RN [place] Agency: [facility]  Health Date [place]: 10/04/21 Time [place]: 1779 Representative spoke with at [place]: [place] ([place]) Interventions    Readmission Risk Interventions Readmission Risk Prevention Plan 02/18/2020  Transportation Screening Complete  PCP or Specialist Appt within 5-7 Days Complete  Home Care Screening Patient refused  Medication Review (RN CM) Complete  Some recent data might be hidden

## 2021-10-04 NOTE — Plan of Care (Signed)
°  Problem: Education: Goal: Knowledge of General Education information will improve Description: Including pain rating scale, medication(s)/side effects and non-pharmacologic comfort measures Outcome: Progressing   Problem: Clinical Measurements: Goal: Ability to maintain clinical measurements within normal limits will improve Outcome: Progressing   Problem: Nutrition: Goal: Adequate nutrition will be maintained Outcome: Progressing

## 2021-10-04 NOTE — Discharge Summary (Addendum)
Physician Discharge Summary  Valerie [name] CHE:[id number] DOB: 05-21-1946 DOA: 10/01/2021  PCP: Roetta Sessions, NP  Admit date: 10/01/2021 Discharge date: 10/04/2021  Admitted From: ALF Disposition:  ALF  Discharge Condition:Stable CODE STATUS: DNR Diet recommendation:Regular   Brief/Interim Summary: Patient is a 76 year old female with history of metastatic non-small cell lung cancer currently on not on any treatment, paroxysmal A-fib not on anticoagulation, combined systolic/diastolic congestive heart failure, history of DVT who presents from ALF with concern of left facial droop, dysphagia.  She was found to be confused with slurred speech when family went to visit her at assisted living facility.  She looked more weaker and  was brought to the emergency department.  Presented as a code stroke.  CT head, MRI of the brain did not show any acute intracranial normalities so a stroke has been ruled out.  UA was suggestive of UTI, also found to be COVID-positive.  She is asymptomatic with COVID, not requiring oxygen.  Urine culture showed E. coli, Proteus, both pansensitive.  Palliative care was following during this hospitalization and recommend outpatient follow-up.  PT recommended discharge back to ALF.  She is medically stable for discharge today.  We recommend to follow-up with palliative care as an outpatient.  Following problems were addressed during her hospitalization:  Altered mental status/acute metabolic encephalopathy: Likely secondary to COVID, UTI.  Presented with concern for left facial droop, dysarthria, confusion.  Brain imaging is negative for acute findings.  Stroke has been ruled out.  Currently she is alert and oriented   Urinary tract infection: History of recurrent UTI.  UA was suggestive of UTI.  Urine culture showed pansensitive E. coli, Proteus.  Antibiotics changed to oral   COVID infection: Respiratory status is stable.  Chest x-ray Showed retrocardiac  opacity, not sure if there is pneumonia or lung cancer.  Since she was asymptomatic, not started on any treatment but was on Paxlovid .   Paroxysmal A-fib: Not on anticoagulation due to history of hematuria.  Currently in normal sinus rhythm.     History of coronary artery disease: Not on antiplatelet therapy.  No anginal symptoms   History of combined systolic/diastolic congestive heart failure: Currently euvolemic.  Last echo on 03/2020 showed EF of 25 to 53%, grade 1 diastolic dysfunction.   History of dementia: On Aricept   Severe protein calorie malnutrition: We had consulted nutritionist .  Continue oral supplements   Chronic diarrhea: GI pathogen panel, C. difficile negative. 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 She was on Tarceva but currently on hold because of side affects.  Oncology has recommended palliative care/hospice referral. Was following with Dr. Julien Nordmann. Palliative care was consulted here, recommended outpatient follow-up  Pressure Injury 09/17/19 Heel Deep Tissue Pressure Injury - Purple or maroon localized area of discolored intact skin or blood-filled blister due to damage of underlying soft tissue from pressure and/or shear. (Active)  09/17/19 0948  Location: Heel  Location Orientation:   Staging: Deep Tissue Pressure Injury - Purple or maroon localized area of discolored intact skin or blood-filled blister due to damage of underlying soft tissue from pressure and/or shear.  Wound Description (Comments):   Present on Admission: Yes     Pressure Injury 06/03/20 Back Right;Left;Mid;Upper;Lower Stage 2 -  Partial thickness loss of dermis presenting as a shallow open injury with a red, pink wound bed without slough. whole  back and buttocks pressure injury.  (Active)  06/03/20 1030  Location: Back  Location Orientation:  Right;Left;Mid;Upper;Lower  Staging: Stage 2 -  Partial thickness loss of dermis presenting as a shallow open injury with a red, pink wound bed without slough.  Wound Description (Comments): whole back and buttocks pressure injury.   Present on Admission: Yes     Pressure Injury 10/02/21 Rectum Mid Stage 2 -  Partial thickness loss of dermis presenting as a shallow open injury with a red, pink wound bed without slough. (Active)  10/02/21 2000  Location: Rectum  Location Orientation: Mid  Staging: Stage 2 -  Partial thickness loss of dermis presenting as a shallow open injury with a red, pink wound bed without slough.  Wound Description (Comments):   Present on Admission: Yes   [place] RN requested for wound care        Discharge Diagnoses:  Principal Problem:   Acute metabolic encephalopathy Active Problems:   Dementia ([place])   Congestive heart failure (CHF) (HCC)   DNR (do not resuscitate)   PAF (paroxysmal atrial fibrillation) ([place])   Dysarthria   UTI (urinary tract infection)   COVID-19 virus infection    Discharge Instructions  Discharge Instructions     Amb Referral to Palliative Care   Complete by: As directed    Diet - low sodium heart healthy   Complete by: As directed    Discharge instructions   Complete by: As directed    1)Please take prescribed medications as instructed 2)Follow up with your PCP in a week 3)Follow up with outpatient palliative care   Increase activity slowly   Complete by: As directed    No wound care   Complete by: As directed       Allergies as of 10/04/2021       Reactions   Broccoli [brassica Oleracea] Other (See Comments)   unknown   Fruit Extracts Other (See Comments)   unknown   Sulfa Antibiotics Other (See Comments)   She cant remember  what happens        Medication List     STOP taking these medications    LOPERAMIDE HCL PO Replaced by: loperamide 2 MG capsule       TAKE these medications    Acetaminophen  Extra Strength 500 MG tablet Generic drug: acetaminophen Take 1,000 mg by mouth every 8 (eight) hours as needed for moderate pain.   cephALEXin 500 MG capsule Commonly known as: KEFLEX Take 1 capsule (500 mg total) by mouth 4 (four) times daily for 3 days. What changed: when to take this   Desitin 13 % Crea Generic drug: Zinc Oxide Apply 1 application topically 2 (two) times daily. What changed:  how to take this additional instructions   donepezil 10 MG tablet Commonly known as: ARICEPT TAKE 1 TABLET BY MOUTH ONCE DAILY IN THE EVENING   EMOLLIENT EX Apply 1 application topically daily as needed (skin health).   erlotinib 100 MG tablet Commonly known as: TARCEVA Take 100 mg by mouth daily. Take on an empty stomach 1 hour before meals or 2 hours after   feeding supplement Liqd Take 237 mLs by mouth 2 (two) times daily between meals.   loperamide 2 MG capsule Commonly known as: IMODIUM Take 2 capsules (4 mg total) by mouth every 8 (eight) hours as needed (diarrhea). Replaces: LOPERAMIDE HCL PO   Magnesium 200 MG Tabs Take 200 mg by mouth daily.   Multi-Vitamins Tabs Take 1 tablet by  mouth every evening.   nirmatrelvir/ritonavir EUA (renal dosing) 10 x 150 MG & 10 x 100MG  Tabs Commonly known as: PAXLOVID Take 2 tablets by mouth 2 (two) times daily for 3 days. Take nirmatrelvir (150 mg) one tablet twice daily for 5 days and ritonavir (100 mg) one tablet twice daily for 5 days.   NON FORMULARY Apply 1 application topically every 4 (four) hours as needed (for rash to buttocks).   ondansetron 4 MG tablet Commonly known as: Zofran Take 1 tablet (4 mg total) by mouth every 8 (eight) hours as needed for nausea or vomiting.   prochlorperazine 10 MG tablet Commonly known as: COMPAZINE Take 1 tablet (10 mg total) by mouth every 6 (six) hours as needed for nausea or vomiting.   saccharomyces boulardii 250 MG capsule Commonly known as: FLORASTOR Take 1 capsule (250 mg  total) by mouth 2 (two) times daily.   vitamin B-12 1000 MCG tablet Commonly known as: CYANOCOBALAMIN Take 1,000 mcg by mouth every evening.   vitamin C 100 MG tablet Take 100 mg by mouth every evening. Gummie        Follow-up Information     Roetta Sessions, NP. Schedule an appointment as soon as possible for a visit in 1 week(s).   Specialty: Nurse Practitioner Contact information: [address] [place] [address] [phone]         Buford Dresser, MD .   Specialty: Cardiology Contact information: [address] [place] [address] [phone]                Allergies  Allergen Reactions   Broccoli [Brassica Oleracea] Other (See Comments)    unknown   Fruit Extracts Other (See Comments)    unknown   Sulfa Antibiotics Other (See Comments)    She cant remember  what happens    Consultations: Palliative care   Procedures/Studies: CT Chest W Contrast  Result Date: 09/13/2021 CLINICAL DATA:  Non-small cell lung cancer staging, status post left lower lobectomy, radiation therapy complete, ongoing chemotherapy EXAM: CT CHEST, ABDOMEN, AND PELVIS WITH CONTRAST TECHNIQUE: Multidetector CT imaging of the chest, abdomen and pelvis was performed following the standard protocol during bolus administration of intravenous contrast. RADIATION DOSE REDUCTION: This exam was performed according to the departmental dose-optimization program which includes automated exposure control, adjustment of the mA and/or kV according to patient size and/or use of iterative reconstruction technique. CONTRAST:  167mL OMNIPAQUE IOHEXOL 300 MG/ML SOLN, additional oral enteric contrast COMPARISON:  CT abdomen pelvis, 04/17/2021, CT chest abdomen pelvis, 02/23/2021 FINDINGS: CT CHEST FINDINGS Cardiovascular: Aortic atherosclerosis. Normal heart size. Three-vessel coronary artery calcifications. No pericardial effusion. Mediastinum/Nodes: No  enlarged mediastinal, hilar, or axillary lymph nodes. Thyroid gland, trachea, and esophagus demonstrate no significant findings. Lungs/Pleura: Redemonstrated postoperative findings status post left lower lobectomy with perihilar and paramedian radiation fibrosis and consolidation of the left lung, as well as peribronchovascular consolidation and fibrosis of the central perihilar right upper lobe (series 7, image 59). Small, loculated left pleural effusion. Stable, benign 0.4 cm nodule of the peripheral right upper lobe (series 7, image 56). Musculoskeletal: No chest wall mass or suspicious osseous lesions identified. CT ABDOMEN PELVIS FINDINGS Hepatobiliary: No solid liver abnormality is seen. No gallstones, gallbladder wall thickening, or biliary dilatation. Pancreas: Unremarkable. No pancreatic ductal dilatation or surrounding inflammatory changes. Spleen: Normal in size without significant abnormality. Adrenals/Urinary Tract: Adrenal glands are unremarkable. 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 The right kidney is normal, without renal calculi, solid lesion, or hydronephrosis. Bladder is unremarkable. Stomach/Bowel: Stomach is within normal limits. Appendix appears normal. No evidence of bowel wall thickening, distention, or inflammatory changes. Vascular/Lymphatic: Aortic atherosclerosis. No enlarged abdominal or pelvic lymph nodes. Reproductive: Uterine fibroids. Other: No abdominal wall hernia or abnormality. No ascites. Musculoskeletal: No acute osseous findings. Unchanged, chronic superior endplate and wedge deformities of the T10, L1, L4, and L5 vertebral bodies (series 6, image 99). IMPRESSION: 1. Redemonstrated postoperative findings status post left lower lobectomy with perihilar and paramedian consolidation and radiation fibrosis of the left  lung, as well as peribronchovascular consolidation and radiation fibrosis of the central perihilar right upper lobe. No evidence of recurrent malignancy in the chest. The presence of residual or recurrent metabolically active malignancy within areas of fibrosis may be more sensitively assessed by PET-CT if desired. 2. Small, loculated left pleural effusion. 3. No evidence of metastatic disease in the abdomen or pelvis. 4. 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 Previously seen right-sided hydronephrosis is resolved. 5. Unchanged thoracic and lumbar wedge and superior endplate deformities. 6. Coronary artery disease. Aortic Atherosclerosis (ICD10-I70.0). Electronically Signed   By: Delanna Ahmadi M.D.   On: 09/13/2021 13:16   MR BRAIN WO CONTRAST  Result Date: 10/01/2021 CLINICAL DATA:  Initial evaluation for acute TIA. EXAM: MRI HEAD WITHOUT CONTRAST TECHNIQUE: Multiplanar, multiecho pulse sequences of the brain and surrounding structures were obtained without intravenous contrast. COMPARISON:  Prior CTs from earlier the same day. FINDINGS: Brain: Diffuse prominence of the CSF containing spaces compatible generalized cerebral atrophy. Patchy and confluent T2/FLAIR hyperintensity involving the periventricular deep white matter both cerebral hemispheres as well as the pons, compatible with chronic microvascular ischemic disease, moderately advanced in nature. Few associated remote lacunar infarcts present about the hemispheric cerebral white matter. Remote bilateral parieto-occipital infarcts with associated laminar necrosis noted, better seen on prior CT. Small remote right cerebellar infarct noted. No evidence for acute or subacute ischemia. No acute intracranial hemorrhage. Few scattered chronic micro hemorrhages noted involving the supratentorial the  vertebral brain, likely hypertensive in nature. No mass lesion, midline shift or mass effect. No hydrocephalus or extra-axial fluid collection. Pituitary gland suprasellar region normal. Midline structures intact. Vascular: Major intracranial vascular flow voids are maintained. Skull and upper cervical spine: Craniocervical junction within normal limits. Sequelae of prior left occipital craniotomy. No scalp soft tissue abnormality. Sinuses/Orbits: Globes and orbital soft tissues within normal limits. Mild scattered mucosal thickening noted within the ethmoidal air cells. Paranasal sinuses are otherwise clear. Trace right mastoid effusion, of doubtful significance. Other: None. IMPRESSION: 1. No acute intracranial abnormality. 2. 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 3. Moderately advanced chronic microvascular ischemic disease, with a few remote lacunar infarcts about the hemispheric cerebral white matter and cerebellum. Electronically Signed   By: Jeannine Boga M.D.   On: 10/01/2021 20:25   CT ABDOMEN PELVIS W CONTRAST  Result Date: 09/20/2021 CLINICAL DATA:  Dysuria, dementia, worsening sacral decubitus ulcers EXAM: CT ABDOMEN AND PELVIS WITH CONTRAST TECHNIQUE: Multidetector CT imaging of the abdomen and pelvis was performed using the standard protocol following bolus administration of intravenous contrast. RADIATION DOSE REDUCTION: This exam was performed according to the departmental dose-optimization program which includes automated exposure control, adjustment of the  mA and/or kV according to patient size and/or use of iterative reconstruction technique. CONTRAST:  44mL OMNIPAQUE IOHEXOL 300 MG/ML  SOLN COMPARISON:  09/12/2021 FINDINGS: Lower chest: Stable left pleural effusion. Chronic elevation of the left hemidiaphragm. Hepatobiliary: No focal liver abnormality is seen. No evidence of cholelithiasis or gallbladder wall thickening.  Bile duct normal for age. Pancreas: No acute inflammatory changes. Stable chronic pancreatic duct dilation. Spleen: Normal in size without focal abnormality. Adrenals/Urinary Tract: There is a 7 mm nonobstructing calculus within the right renal pelvis. There are 2 other punctate less than 3 mm nonobstructing right renal calculi as well. The right kidney enhances normally. 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 There is stable left hydroureter, with mild diffuse enhancement of the left ureteral mucosa. This could reflect underlying infection. Please correlate with urinalysis. No urinary tract calculi. Bladder is decompressed. 5 mm nonobstructing calculus layers dependently within the right-side of the bladder lumen. Stomach/Bowel: No bowel obstruction or ileus. Normal appendix right lower quadrant. No bowel wall thickening or inflammatory change. Vascular/Lymphatic: Aortic atherosclerosis. No enlarged abdominal or pelvic lymph nodes. Reproductive: Uterus and bilateral adnexa are unremarkable. Other: No free fluid or free intraperitoneal gas. No abdominal wall hernia. Musculoskeletal: No acute or destructive bony lesions. Reconstructed images demonstrate stable chronic compression deformities at T10, L1, L4, and L5. IMPRESSION: 1. Nonobstructing right renal and bladder calculi as above. 2. Chronic left hydroureter, with diffuse left ureteral mucosal enhancement. Findings could reflect underlying infection. Please correlate with urinalysis. 3. Stable left pleural effusion. 4.  Aortic Atherosclerosis (ICD10-I70.0). Electronically Signed   By: Randa Ngo M.D.   On: 09/20/2021 18:32   CT Abdomen Pelvis W Contrast  Result Date: 09/13/2021 CLINICAL DATA:  Non-small cell lung cancer staging, status post left lower lobectomy, radiation therapy complete, ongoing chemotherapy EXAM: CT CHEST, ABDOMEN, AND PELVIS WITH CONTRAST TECHNIQUE: Multidetector CT imaging of the chest, abdomen and pelvis was performed  following the standard protocol during bolus administration of intravenous contrast. RADIATION DOSE REDUCTION: This exam was performed according to the departmental dose-optimization program which includes automated exposure control, adjustment of the mA and/or kV according to patient size and/or use of iterative reconstruction technique. CONTRAST:  167mL OMNIPAQUE IOHEXOL 300 MG/ML SOLN, additional oral enteric contrast COMPARISON:  CT abdomen pelvis, 04/17/2021, CT chest abdomen pelvis, 02/23/2021 FINDINGS: CT CHEST FINDINGS Cardiovascular: Aortic atherosclerosis. Normal heart size. Three-vessel coronary artery calcifications. No pericardial effusion. Mediastinum/Nodes: No enlarged mediastinal, hilar, or axillary lymph nodes. Thyroid gland, trachea, and esophagus demonstrate no significant findings. Lungs/Pleura: Redemonstrated postoperative findings status post left lower lobectomy with perihilar and paramedian radiation fibrosis and consolidation of the left lung, as well as peribronchovascular consolidation and fibrosis of the central perihilar right upper lobe (series 7, image 59). Small, loculated left pleural effusion. Stable, benign 0.4 cm nodule of the peripheral right upper lobe (series 7, image 56). Musculoskeletal: No chest wall mass or suspicious osseous lesions identified. CT ABDOMEN PELVIS FINDINGS Hepatobiliary: No solid liver abnormality is seen. No gallstones, gallbladder wall thickening, or biliary dilatation. Pancreas: Unremarkable. No pancreatic ductal dilatation or surrounding inflammatory changes. Spleen: Normal in size without significant abnormality. Adrenals/Urinary Tract: Adrenal glands are unremarkable. 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 The right kidney is normal, without renal calculi, solid lesion, or hydronephrosis. Bladder  is unremarkable. Stomach/Bowel: Stomach is within normal limits. Appendix appears normal. No evidence of bowel wall thickening,  distention, or inflammatory changes. Vascular/Lymphatic: Aortic atherosclerosis. No enlarged abdominal or pelvic lymph nodes. Reproductive: Uterine fibroids. Other: No abdominal wall hernia or abnormality. No ascites. Musculoskeletal: No acute osseous findings. Unchanged, chronic superior endplate and wedge deformities of the T10, L1, L4, and L5 vertebral bodies (series 6, image 99). IMPRESSION: 1. Redemonstrated postoperative findings status post left lower lobectomy with perihilar and paramedian consolidation and radiation fibrosis of the left lung, as well as peribronchovascular consolidation and radiation fibrosis of the central perihilar right upper lobe. No evidence of recurrent malignancy in the chest. The presence of residual or recurrent metabolically active malignancy within areas of fibrosis may be more sensitively assessed by PET-CT if desired. 2. Small, loculated left pleural effusion. 3. No evidence of metastatic disease in the abdomen or pelvis. 4. 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 Previously seen right-sided hydronephrosis is resolved. 5. Unchanged thoracic and lumbar wedge and superior endplate deformities. 6. Coronary artery disease. Aortic Atherosclerosis (ICD10-I70.0). Electronically Signed   By: Delanna Ahmadi M.D.   On: 09/13/2021 13:16   DG Chest Portable 1 View  Result Date: 10/01/2021 CLINICAL DATA:  concern for pna EXAM: PORTABLE CHEST 1 VIEW COMPARISON:  Chest x-ray 08/22/2021, CT chest 09/12/2021 FINDINGS: The heart and mediastinal contours are within normal limits. Bilateral hilar prominence consistent with known peribronchovascular consolidation and fibrosis best visualized on CT chest 09/12/2021.  Aortic calcification. Retrocardiac airspace opacity. No pulmonary edema. Persistent at least trace volume left pleural effusion. No pneumothorax. No acute osseous abnormality.  Old healed left rib fractures. IMPRESSION: 1. Retrocardiac airspace opacity and persistent at least trace volume left pleural effusion. Followup PA and lateral chest X-ray is recommended in 3-4 weeks following therapy to ensure resolution. 2.  Aortic Atherosclerosis (ICD10-I70.0). Electronically Signed   By: Iven Finn M.D.   On: 10/01/2021 19:06   CT HEAD CODE STROKE WO CONTRAST  Result Date: 10/01/2021 CLINICAL DATA:  Code stroke. Neuro deficit, acute, stroke suspected. EXAM: CT HEAD WITHOUT CONTRAST TECHNIQUE: Contiguous axial images were obtained from the base of the skull through the vertex without intravenous contrast. RADIATION DOSE REDUCTION: This exam was performed according to the departmental dose-optimization program which includes automated exposure control, adjustment of the mA and/or kV according to patient size and/or use of iterative reconstruction technique. COMPARISON:  03/22/2020 FINDINGS: Brain: No acute finding or change since the prior exam. There is cerebellar atrophy without focal insult. Patient has had previous left parieto-occipital craniotomy. There is volume loss affecting the cortex in both posterior parietal/occipital regions, more extensive on the left than the right, with laminar necrosis and cortical calcification. There chronic small-vessel ischemic changes elsewhere affecting the cerebral hemispheric white matter. Vascular: There is atherosclerotic calcification of the major vessels at the base of the brain. Skull: Otherwise negative Sinuses/Orbits: Clear/normal Other: None ASPECTS ([place] Stroke Program Early CT Score) - Ganglionic level infarction (caudate, lentiform nuclei, internal capsule, insula, M1-M3 cortex): 7 - Supraganglionic infarction (M4-M6 cortex): 3 Total score (0-10 with 10 being  normal): 10 IMPRESSION: 1. No acute CT finding. Distant left posterior parietal craniotomy. Old bilateral parieto-occipital cortical infarctions with laminar necrosis and calcification. 2. ASPECTS is 10 3. These results were communicated to Dr. Leonel Ramsay at 6:19 pm on 10/01/2021 by text page via the [facility] messaging system. Electronically Signed   By: Nelson Chimes M.D.   On: 10/01/2021 18:20   CT ANGIO HEAD NECK  W WO CM (CODE STROKE)  Result Date: 10/01/2021 CLINICAL DATA:  Stroke. EXAM: CT ANGIOGRAPHY HEAD AND NECK TECHNIQUE: Multidetector CT imaging of the head and neck was performed using the standard protocol during bolus administration of intravenous contrast. Multiplanar CT image reconstructions and MIPs were obtained to evaluate the vascular anatomy. Carotid stenosis measurements (when applicable) are obtained utilizing NASCET criteria, using the distal internal carotid diameter as the denominator. RADIATION DOSE REDUCTION: This exam was performed according to the departmental dose-optimization program which includes automated exposure control, adjustment of the mA and/or kV according to patient size and/or use of iterative reconstruction technique. CONTRAST:  74mL OMNIPAQUE IOHEXOL 350 MG/ML SOLN COMPARISON:  Soft tissue neck CT 12/29/2019.  Chest CT 09/12/2021. FINDINGS: CTA NECK FINDINGS Aortic arch: Standard 3 vessel aortic arch with mild atherosclerotic plaque. No significant arch vessel origin stenosis. Right carotid system: Patent with a small amount of calcified plaque at the carotid bifurcation. No evidence of a significant stenosis or dissection. Left carotid system: Patent with minimal calcified plaque at the carotid bifurcation. No evidence of a significant stenosis or dissection. Vertebral arteries: Patent and codominant without evidence a significant stenosis or dissection. Skeleton: No acute osseous abnormality or suspicious osseous lesion. Other neck: Fatty replacement of the right parotid  gland. Status post right hemithyroidectomy. No evidence of cervical lymphadenopathy. Upper chest: Partially visualized small pericardial effusion, left hemithorax volume loss, and small left pleural effusion as shown on prior chest CT. Review of the MIP images confirms the above findings CTA HEAD FINDINGS Anterior circulation: The internal carotid arteries are widely patent from skull base to carotid termini. ACAs and MCAs are patent without evidence of a proximal branch occlusion or significant proximal stenosis. No aneurysm is identified. Posterior circulation: Intracranial vertebral arteries are widely patent to the basilar. The left PICA and right AICA appear dominant. Patent SCA origins are seen bilaterally. The basilar artery is widely patent. 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 Both PCAs are patent without evidence of a significant proximal stenosis. No aneurysm is identified. Venous sinuses: As permitted by contrast timing, patent. Anatomic variants: Fetal origin of the PCAs. Review of the MIP images confirms the above findings IMPRESSION: 1. No large vessel occlusion or significant stenosis in the head or neck. 2.  Aortic Atherosclerosis (ICD10-I70.0). These results were communicated to Dr. Leonel Ramsay at 6:38 pm on 10/01/2021 by text page via the [facility] messaging system. Electronically Signed   By: Logan Bores M.D.   On: 10/01/2021 18:48      Subjective: Patient seen and examined at the bedside this morning.  Hemodynamically stable for discharge today ,I called the daughter and answered all questions  Discharge Exam: Vitals:   10/04/21 0817 10/04/21 1132  BP: 105/76 (!) 127/53  Pulse: 73 70  Resp: (!) 24 20  Temp: (!) 97.5 F (36.4 C) 97.6 F (36.4 C)  SpO2: 99% 96%   Vitals:   10/04/21 0002 10/04/21 0410 10/04/21 0817 10/04/21 1132  BP:  (!) 113/48 105/76 (!) 127/53  Pulse:  63 73 70  Resp:  15 (!) 24 20   Temp: 98.7 F (37.1 C) 98.4 F (36.9 C) (!) 97.5 F (36.4 C) 97.6 F (36.4 C)  TempSrc: Oral Oral Oral   SpO2:  96% 99% 96%  Weight:        General: Pt is alert, awake, not in acute distress,very deconditioned Cardiovascular: RRR, S1/S2 +, no rubs, no gallops Respiratory: CTA bilaterally,  no wheezing, no rhonchi Abdominal: Soft, NT, ND, bowel sounds + Extremities: no edema, no cyanosis  The results of significant diagnostics from this hospitalization (including imaging, microbiology, ancillary and laboratory) are listed below for reference.     Microbiology: Recent Results (from the past 240 hour(s))  Resp Panel by RT-PCR (Flu A&B, Covid) Nasopharyngeal Swab     Status: Abnormal   Collection Time: 10/01/21  6:45 PM   Specimen: Nasopharyngeal Swab; Nasopharyngeal(NP) swabs in vial transport medium  Result Value Ref Range Status   SARS Coronavirus 2 by RT PCR POSITIVE (A) NEGATIVE Final    Comment: (NOTE) SARS-CoV-2 target nucleic acids are DETECTED.  The SARS-CoV-2 RNA is generally detectable in upper respiratory specimens during the acute phase of infection. Positive results are indicative of the presence of the identified virus, but do not rule out bacterial infection or co-infection with other pathogens not detected by the test. Clinical correlation with patient history and other diagnostic information is necessary to determine patient infection status. The expected result is Negative.  Fact Sheet for Patients: EntrepreneurPulse.com.au  Fact Sheet for Healthcare Providers: IncredibleEmployment.be  This test is not yet approved or cleared by the [place] FDA and  has been authorized for detection and/or diagnosis of SARS-CoV-2 by FDA under an Emergency Use Authorization (EUA).  This EUA will remain in effect (meaning this test can be used) for the duration of  the COVID-19 declaration under Section 564(b)(1) of the A ct,  21 U.S.C. section 360bbb-3(b)(1), unless the authorization is terminated or revoked sooner.     Influenza A by PCR NEGATIVE NEGATIVE Final   Influenza B by PCR NEGATIVE NEGATIVE Final    Comment: (NOTE) The Xpert Xpress SARS-CoV-2/FLU/RSV plus assay is intended as an aid in the diagnosis of influenza from Nasopharyngeal swab specimens and should not be used as a sole basis for treatment. Nasal washings and aspirates are unacceptable for Xpert Xpress SARS-CoV-2/FLU/RSV testing.  Fact Sheet for Patients: EntrepreneurPulse.com.au  Fact Sheet for Healthcare Providers: IncredibleEmployment.be  This test is not yet approved or cleared by the [place] FDA and has been authorized for detection and/or diagnosis of SARS-CoV-2 by FDA under an Emergency Use Authorization (EUA). This EUA will remain in effect (meaning this test can be used) for the duration of the COVID-19 declaration under Section 564(b)(1) of the Act, 21 U.S.C. section 360bbb-3(b)(1), unless the authorization is terminated or revoked.  Performed at [facility], [place] [address] [place], [place] [address]   Urine Culture     Status: Abnormal   Collection Time: 10/01/21  9:11 PM   Specimen: Urine, Clean Catch  Result Value Ref Range Status   Specimen Description URINE, CLEAN CATCH  Final   Special Requests   Final    added 2111 Performed at [facility], [place] [address] [place],  [address]    Culture (A)  Final    >=100,000 COLONIES/mL ESCHERICHIA COLI 70,000 COLONIES/mL PROTEUS MIRABILIS    Report Status 10/04/2021 FINAL  Final   Organism ID, Bacteria ESCHERICHIA COLI (A)  Final   Organism ID, Bacteria PROTEUS MIRABILIS (A)  Final      Susceptibility   Escherichia coli - MIC*    AMPICILLIN 8 SENSITIVE Sensitive     CEFAZOLIN <=4 SENSITIVE Sensitive     CEFEPIME <=0.12 SENSITIVE Sensitive     CEFTRIAXONE <=0.25 SENSITIVE Sensitive     CIPROFLOXACIN >=4  RESISTANT Resistant     GENTAMICIN >=16 RESISTANT Resistant  IMIPENEM <=0.25 SENSITIVE Sensitive     NITROFURANTOIN <=16 SENSITIVE Sensitive     TRIMETH/SULFA <=20 SENSITIVE Sensitive     AMPICILLIN/SULBACTAM <=2 SENSITIVE Sensitive     PIP/TAZO <=4 SENSITIVE Sensitive     * >=100,000 COLONIES/mL ESCHERICHIA COLI   Proteus mirabilis - MIC*    AMPICILLIN <=2 SENSITIVE Sensitive     CEFAZOLIN <=4 SENSITIVE Sensitive     CEFEPIME <=0.12 SENSITIVE Sensitive     CEFTRIAXONE <=0.25 SENSITIVE Sensitive     CIPROFLOXACIN <=0.25 SENSITIVE Sensitive     GENTAMICIN <=1 SENSITIVE Sensitive     IMIPENEM 2 SENSITIVE Sensitive     NITROFURANTOIN 128 RESISTANT Resistant     TRIMETH/SULFA <=20 SENSITIVE Sensitive     AMPICILLIN/SULBACTAM <=2 SENSITIVE Sensitive     PIP/TAZO <=4 SENSITIVE Sensitive     * 70,000 COLONIES/mL PROTEUS MIRABILIS  C Difficile Quick Screen w PCR reflex     Status: None   Collection Time: 10/03/21  9:32 PM   Specimen: Stool  Result Value Ref Range Status   C Diff antigen NEGATIVE NEGATIVE Final   C Diff toxin NEGATIVE NEGATIVE Final   C Diff interpretation No C. difficile detected.  Final    Comment: Performed at [facility], [place] [address] [place], [place] [address]  Gastrointestinal Panel by PCR , Stool     Status: None   Collection Time: 10/03/21  9:32 PM   Specimen: Stool  Result Value Ref Range Status   Campylobacter species NOT DETECTED NOT DETECTED Final   Plesimonas shigelloides NOT DETECTED NOT DETECTED Final   Salmonella species NOT DETECTED NOT DETECTED Final   Yersinia enterocolitica NOT DETECTED NOT DETECTED Final   Vibrio species NOT DETECTED NOT DETECTED Final   Vibrio cholerae NOT DETECTED NOT DETECTED Final   Enteroaggregative E coli (EAEC) NOT DETECTED NOT DETECTED Final   Enteropathogenic E coli (EPEC) NOT DETECTED NOT DETECTED Final   Enterotoxigenic E coli (ETEC) NOT DETECTED NOT DETECTED Final   Shiga like toxin producing E coli  (STEC) NOT DETECTED NOT DETECTED Final   Shigella/Enteroinvasive E coli (EIEC) NOT DETECTED NOT DETECTED Final   Cryptosporidium NOT DETECTED NOT DETECTED Final   Cyclospora cayetanensis NOT DETECTED NOT DETECTED Final   Entamoeba histolytica NOT DETECTED NOT DETECTED Final   Giardia lamblia NOT DETECTED NOT DETECTED Final   Adenovirus F40/41 NOT DETECTED NOT DETECTED Final   Astrovirus NOT DETECTED NOT DETECTED Final   Norovirus GI/GII NOT DETECTED NOT DETECTED Final   Rotavirus A NOT DETECTED NOT DETECTED Final   Sapovirus (I, II, IV, and V) NOT DETECTED NOT DETECTED Final    Comment: Performed at [facility], [place] [place],  [address]     Labs: BNP (last 3 results) No results for input(s): BNP in the last 8760 hours. Basic Metabolic Panel: Recent Labs  Lab 10/01/21 1811 10/02/21 0430  NA 136   138 137  K 3.7   3.9 3.5  CL 105   106 106  CO2 22 21*  GLUCOSE 115*   112* 114*  BUN 14   18 16   CREATININE 1.00   0.90 0.97  CALCIUM 8.9 8.9   Liver Function Tests: Recent Labs  Lab 10/01/21 1811  AST 15  ALT 11  ALKPHOS 97  BILITOT 0.8  PROT 6.2*  ALBUMIN 3.1*   No results for input(s): LIPASE, AMYLASE in the last 168 hours. No results for input(s): AMMONIA in the last 168 hours. CBC: Recent  Labs  Lab 10/01/21 1811 10/02/21 0430  WBC 8.7 8.0  NEUTROABS 7.3  --   HGB 11.9*   13.3 11.7*  HCT 39.7   39.0 37.0  MCV 92.3 90.0  PLT 239 236   Cardiac Enzymes: No results for input(s): CKTOTAL, CKMB, CKMBINDEX, TROPONINI in the last 168 hours. BNP: Invalid input(s): POCBNP CBG: Recent Labs  Lab 10/01/21 1805  GLUCAP 110*   D-Dimer No results for input(s): DDIMER in the last 72 hours. Hgb A1c No results for input(s): HGBA1C in the last 72 hours. Lipid Profile No results for input(s): CHOL, HDL, LDLCALC, TRIG, CHOLHDL, LDLDIRECT in the last 72 hours. Thyroid function studies No results for input(s): TSH, T4TOTAL, T3FREE, THYROIDAB  in the last 72 hours.  Invalid input(s): FREET3 Anemia work up No results for input(s): VITAMINB12, FOLATE, FERRITIN, TIBC, IRON, RETICCTPCT in the last 72 hours. Urinalysis    Component Value Date/Time   COLORURINE AMBER (A) 10/01/2021 1808   APPEARANCEUR TURBID (A) 10/01/2021 1808   LABSPEC 1.027 10/01/2021 1808   PHURINE 7.0 10/01/2021 1808   GLUCOSEU NEGATIVE 10/01/2021 1808   HGBUR MODERATE (A) 10/01/2021 1808   BILIRUBINUR NEGATIVE 10/01/2021 1808   BILIRUBINUR negative 05/25/2019 1653   KETONESUR NEGATIVE 10/01/2021 1808   PROTEINUR 100 (A) 10/01/2021 1808   UROBILINOGEN 0.2 05/25/2019 1653   UROBILINOGEN 0.2 10/20/2014 1636   NITRITE NEGATIVE 10/01/2021 1808   LEUKOCYTESUR LARGE (A) 10/01/2021 1808   Sepsis Labs Invalid input(s): PROCALCITONIN,  WBC,  LACTICIDVEN Microbiology Recent Results (from the past 240 hour(s))  Resp Panel by RT-PCR (Flu A&B, Covid) Nasopharyngeal Swab     Status: Abnormal   Collection Time: 10/01/21  6:45 PM   Specimen: Nasopharyngeal Swab; Nasopharyngeal(NP) swabs in vial transport medium  Result Value Ref Range Status   SARS Coronavirus 2 by RT PCR POSITIVE (A) NEGATIVE Final    Comment: (NOTE) SARS-CoV-2 target nucleic acids are DETECTED.  The SARS-CoV-2 RNA is generally detectable in upper respiratory specimens during the acute phase of infection. Positive results are indicative of the presence of the identified virus, but do not rule out bacterial infection or co-infection with other pathogens not detected by the test. Clinical correlation with patient history and other diagnostic information is necessary to determine patient infection status. The expected result is Negative.  Fact Sheet for Patients: EntrepreneurPulse.com.au  Fact Sheet for Healthcare Providers: IncredibleEmployment.be  This test is not yet approved or cleared by the [place] FDA and  has been authorized for detection  and/or diagnosis of SARS-CoV-2 by FDA under an Emergency Use Authorization (EUA).  This EUA will remain in effect (meaning this test can be used) for the duration of  the COVID-19 declaration under Section 564(b)(1) of the A ct, 21 U.S.C. section 360bbb-3(b)(1), unless the authorization is terminated or revoked sooner.     Influenza A by PCR NEGATIVE NEGATIVE Final   Influenza B by PCR NEGATIVE NEGATIVE Final    Comment: (NOTE) The Xpert Xpress SARS-CoV-2/FLU/RSV plus assay is intended as an aid in the diagnosis of influenza from Nasopharyngeal swab specimens and should not be used as a sole basis for treatment. Nasal washings and aspirates are unacceptable for Xpert Xpress SARS-CoV-2/FLU/RSV testing.  Fact Sheet for Patients: EntrepreneurPulse.com.au  Fact Sheet for Healthcare Providers: IncredibleEmployment.be  This test is not yet approved or cleared by the [place] FDA and has been authorized for detection and/or diagnosis of SARS-CoV-2 by FDA under an Emergency Use Authorization (EUA). This EUA will remain in  effect (meaning this test can be used) for the duration of the COVID-19 declaration under Section 564(b)(1) of the Act, 21 U.S.C. section 360bbb-3(b)(1), unless the authorization is terminated or revoked.  Performed at [facility], [place] [address], [place], [place] [address]   Urine Culture     Status: Abnormal   Collection Time: 10/01/21  9:11 PM   Specimen: Urine, Clean Catch  Result Value Ref Range Status   Specimen Description URINE, CLEAN CATCH  Final   Special Requests   Final    added 2111 Performed at [facility], [place] [address], [place], [place] [address]    Culture (A)  Final    >=100,000 COLONIES/mL ESCHERICHIA COLI 70,000 COLONIES/mL PROTEUS MIRABILIS    Report Status 10/04/2021 FINAL  Final   Organism ID, Bacteria ESCHERICHIA COLI (A)  Final   Organism ID, Bacteria PROTEUS MIRABILIS (A)  Final       Susceptibility   Escherichia coli - MIC*    AMPICILLIN 8 SENSITIVE Sensitive     CEFAZOLIN <=4 SENSITIVE Sensitive     CEFEPIME <=0.12 SENSITIVE Sensitive     CEFTRIAXONE <=0.25 SENSITIVE Sensitive     CIPROFLOXACIN >=4 RESISTANT Resistant     GENTAMICIN >=16 RESISTANT Resistant     IMIPENEM <=0.25 SENSITIVE Sensitive     NITROFURANTOIN <=16 SENSITIVE Sensitive     TRIMETH/SULFA <=20 SENSITIVE Sensitive     AMPICILLIN/SULBACTAM <=2 SENSITIVE Sensitive     PIP/TAZO <=4 SENSITIVE Sensitive     * >=100,000 COLONIES/mL ESCHERICHIA COLI   Proteus mirabilis - MIC*    AMPICILLIN <=2 SENSITIVE Sensitive     CEFAZOLIN <=4 SENSITIVE Sensitive     CEFEPIME <=0.12 SENSITIVE Sensitive     CEFTRIAXONE <=0.25 SENSITIVE Sensitive     CIPROFLOXACIN <=0.25 SENSITIVE Sensitive     GENTAMICIN <=1 SENSITIVE Sensitive     IMIPENEM 2 SENSITIVE Sensitive     NITROFURANTOIN 128 RESISTANT Resistant     TRIMETH/SULFA <=20 SENSITIVE Sensitive     AMPICILLIN/SULBACTAM <=2 SENSITIVE Sensitive     PIP/TAZO <=4 SENSITIVE Sensitive     * 70,000 COLONIES/mL PROTEUS MIRABILIS  C Difficile Quick Screen w PCR reflex     Status: None   Collection Time: 10/03/21  9:32 PM   Specimen: Stool  Result Value Ref Range Status   C Diff antigen NEGATIVE NEGATIVE Final   C Diff toxin NEGATIVE NEGATIVE Final   C Diff interpretation No C. difficile detected.  Final    Comment: Performed at [facility], [place] [address] [place], [place] [address]  Gastrointestinal Panel by PCR , Stool     Status: None   Collection Time: 10/03/21  9:32 PM   Specimen: Stool  Result Value Ref Range Status   Campylobacter species NOT DETECTED NOT DETECTED Final   Plesimonas shigelloides NOT DETECTED NOT DETECTED Final   Salmonella species NOT DETECTED NOT DETECTED Final   Yersinia enterocolitica NOT DETECTED NOT DETECTED Final   Vibrio species NOT DETECTED NOT DETECTED Final   Vibrio cholerae NOT DETECTED NOT DETECTED Final    Enteroaggregative E coli (EAEC) NOT DETECTED NOT DETECTED Final   Enteropathogenic E coli (EPEC) NOT DETECTED NOT DETECTED Final   Enterotoxigenic E coli (ETEC) NOT DETECTED NOT DETECTED Final   Shiga like toxin producing E coli (STEC) NOT DETECTED NOT DETECTED Final   Shigella/Enteroinvasive E coli (EIEC) NOT DETECTED NOT DETECTED Final   Cryptosporidium NOT DETECTED NOT DETECTED Final   Cyclospora cayetanensis NOT  DETECTED NOT DETECTED Final   Entamoeba histolytica NOT DETECTED NOT DETECTED Final   Giardia lamblia NOT DETECTED NOT DETECTED Final   Adenovirus F40/41 NOT DETECTED NOT DETECTED Final   Astrovirus NOT DETECTED NOT DETECTED Final   Norovirus GI/GII NOT DETECTED NOT DETECTED Final   Rotavirus A NOT DETECTED NOT DETECTED Final   Sapovirus (I, II, IV, and V) NOT DETECTED NOT DETECTED Final    Comment: Performed at [facility], [address], [place], [place] [address]    Please note: You were cared for by a hospitalist during your hospital stay. Once you are discharged, your primary care physician will handle any further medical issues. Please note that NO REFILLS for any discharge medications will be authorized once you are discharged, as it is imperative that you return to your primary care physician (or establish a relationship with a primary care physician if you do not have one) for your post hospital discharge needs so that they can reassess your need for medications and monitor your lab values.    Time coordinating discharge: 40 minutes  SIGNED:   Shelly Coss, MD  [facility] 10/04/2021, 1:25 PM Pager [phone]  If 7PM-7AM, please contact night-coverage [email or web address] Password [email or web address]

## 2021-10-04 NOTE — TOC Transition Note (Signed)
Transition of Care [facility]) - CM/SW Discharge Note   Patient Details  Name: Valerie [name] MRN: [id number] Date of Birth: 1945/12/25  [facility] CM/SW Contact:  Carles Collet, RN Phone Number: 10/04/2021, 3:15 PM   Clinical Narrative:    Patient ordered [facility] RN. Notified Louie Boston (patient active in [employer]) that patient will return to ALF and continue [facility] services.     Final next level of care: Assisted Living Barriers to Discharge: No Barriers Identified   Patient Goals and CMS Choice Patient states their goals for this hospitalization and ongoing recovery are:: Return to ALF   Choice offered to / list presented to : Adult Children, Patient  Discharge Placement                       Discharge Plan and Services In-house Referral: Clinical Social Work   Post Acute Care Choice: Resumption of Svcs/PTA Provider                    HH Arranged: RN [place] Agency: [facility] Date [place]: 10/04/21 Time [place]: 1093 Representative spoke with at [place]: [place] ([place]) Interventions     Readmission Risk Interventions Readmission Risk Prevention Plan 02/18/2020  Transportation Screening Complete  PCP or Specialist Appt within 5-7 Days Complete  Home Care Screening Patient refused  Medication Review (RN CM) Complete  Some recent data might be hidden

## 2021-10-05 ENCOUNTER — Encounter (HOSPITAL_COMMUNITY): Payer: Self-pay | Admitting: Family Medicine

## 2021-10-05 NOTE — Progress Notes (Signed)
Patient seen and examined at the bedside this morning.  Hemodynamically stable.  Her diarrhea is better today.  There is no new change in the medical management.  She is stable for discharge to ALF versus skilled facility whenever possible.  PT has recommended ALF on discharge.  I have talked to the daughter and the patient extensively and answered all their questions.  Discharge orders and summary are already in place

## 2021-10-05 NOTE — NC FL2 (Signed)
[place] LEVEL OF CARE SCREENING TOOL     IDENTIFICATION  Patient Name: Valerie [name] Birthdate: [birth year] Sex: female Admission Date (Current Location): 10/01/2021  [facility] and [place] Number:  Herbalist and Address:  [facility], [place] [address], [place], [place] [address]      Provider Number: [id number]  Attending Physician Name and Address:  Shelly Coss, MD  Relative Name and Phone Number:       Current Level of Care: Hospital Recommended Level of Care: [place] Prior Approval Number:    Date Approved/Denied:   PASRR Number: [id number]  Discharge Plan: SNF    Current Diagnoses: Patient Active Problem List   Diagnosis Date Noted   Dysarthria 13/24/4010   Acute metabolic encephalopathy 27/25/3664   UTI (urinary tract infection) 10/01/2021   COVID-19 virus infection 10/01/2021   Metastasis to lymph nodes ([place]) 09/12/2021   PAF (paroxysmal atrial fibrillation) ([place]) 07/07/2021   History of gross hematuria 07/07/2021   Atherosclerosis of aorta ([place]) 40/34/7425   Complicated UTI (urinary tract infection) 04/17/2021   Malnutrition of moderate degree 06/13/2020   Rash and nonspecific skin eruption    Metastatic malignant neoplasm ([place])    Failure to thrive in adult    Asymptomatic bacteriuria    Pressure injury of skin 06/03/2020   Cellulitis 06/03/2020   SVT (supraventricular tachycardia) ([place]) 03/26/2020   Normocytic anemia 03/24/2020   Urinary tract infection without hematuria    AMS (altered mental status) 03/23/2020   Hypokalemia 03/23/2020   Prolonged Q-T interval on ECG 03/23/2020   DNR (do not resuscitate) 03/23/2020   MRSA bacteremia 03/23/2020   Acute combined systolic and diastolic heart failure (HCC)    Elevated troponin    Dilated cardiomyopathy ([place])    Nonocclusive coronary atherosclerosis of native coronary artery    Congestive heart failure (CHF) ([place]) 02/14/2020    Pyelonephritis 09/10/2019   Acute blood loss anemia 06/12/2019   Closed fracture of distal end of right femur, initial encounter ([place]) 06/11/2019   Closed fracture of lateral portion of right tibial plateau 06/11/2019   History of DVT of lower extremity 06/11/2019   Blurry vision 01/06/2019   Headache 07/24/2016   Cough 08/24/2015   Chronic diarrhea 08/24/2015   Sepsis ([place])    Encounter for antineoplastic chemotherapy 01/31/2015   Urinary retention 10/20/2014   Ileus ([place]) 10/20/2014   Cellulitis of leg, right 10/20/2014   Fracture of right proximal fibula    Ankle fracture 10/02/2014   Ankle fracture 10/02/2014   Lung cancer ([place]) 10/02/2014   Closed fibular fracture 10/02/2014   Dementia ([place]) 10/02/2014   Lower urinary tract infectious disease 10/02/2014   Reactive airway disease 10/02/2014   Fall 10/02/2014   Closed left ankle fracture 10/01/2014   Right fibular fracture 10/01/2014   Lung metastasis ([place])    LESION, ANUS 04/26/2009   DIARRHEA 04/26/2009   Primary cancer of right upper lobe of lung ([place]) 04/22/2009   Malignant neoplasm of brain ([place]) 04/22/2009   VITAMIN D DEFICIENCY 04/22/2009   ANAL FISTULA 04/22/2009   OSTEOPOROSIS 04/22/2009    Orientation RESPIRATION BLADDER Height & Weight     Time, Self, Situation, Place  Normal Incontinent Weight: 164 lb 7.4 oz (74.6 kg) Height:  5\' 8"  (172.7 cm)  BEHAVIORAL SYMPTOMS/MOOD NEUROLOGICAL BOWEL NUTRITION STATUS      Incontinent Diet (Regular)  AMBULATORY STATUS COMMUNICATION OF NEEDS Skin   Extensive Assist Verbally PU Stage and Appropriate  Care (MASD on buttocks; Stage II on rectum)                       Personal Care Assistance Level of Assistance  Bathing, Feeding, Dressing Bathing Assistance: Maximum assistance Feeding assistance: Limited assistance Dressing Assistance: Limited assistance     Functional Limitations Info             [place]  PT (By licensed PT)      PT Frequency: continue previous services at ALF              Contractures Contractures Info: Not present    Additional Factors Info  Code Status, Allergies, Isolation Precautions Code Status Info: DNR Allergies Info: Broccoli (Brassica Oleracea), Fruit Extracts, Sulfa Antibiotics     Isolation Precautions Info: COVID +     Current Medications (10/05/2021):  This is the current hospital active medication list Current Facility-Administered Medications  Medication Dose Route Frequency Provider Last Rate Last Admin   0.9 %  sodium chloride infusion   Intravenous Continuous Shelly Coss, MD 75 mL/hr at 10/04/21 2052 New Bag at 10/04/21 2052   acetaminophen (TYLENOL) tablet 650 mg  650 mg Oral Z4M PRN Pershing Proud, NP       cefTRIAXone (ROCEPHIN) 1 g in sodium chloride 0.9 % 100 mL IVPB  1 g Intravenous Q24H Tu, Ching T, DO 200 mL/hr at 10/04/21 2052 1 g at 10/04/21 2052   donepezil (ARICEPT) tablet 10 mg  10 mg Oral QHS Tu, Ching T, DO   10 mg at 10/04/21 2049   enoxaparin (LOVENOX) injection 40 mg  40 mg Subcutaneous Q24H Tu, Ching T, DO   40 mg at 10/05/21 1009   feeding supplement (ENSURE ENLIVE / ENSURE PLUS) liquid 237 mL  237 mL Oral BID BM Adhikari, Amrit, MD   237 mL at 10/04/21 1417   liver oil-zinc oxide (DESITIN) 40 % ointment 1 application  1 application Topical O7M PRN Pershing Proud, NP       loperamide (IMODIUM) capsule 2 mg  2 mg Oral PRN Pershing Proud, NP   2 mg at 10/05/21 1009   melatonin tablet 3 mg  3 mg Oral QHS PRN Pershing Proud, NP       nirmatrelvir/ritonavir EUA (renal dosing) (PAXLOVID) 2 tablet  2 tablet Oral BID Tu, Ching T, DO   2 tablet at 10/05/21 1551   ondansetron (ZOFRAN) injection 4 mg  4 mg Intravenous Q6H PRN Shelly Coss, MD   4 mg at 10/03/21 1521   saccharomyces boulardii (FLORASTOR) capsule 250 mg  250 mg Oral BID Pershing Proud, NP   786 mg at 10/05/21 1009     Discharge Medications: Please see discharge summary for a  list of discharge medications.  Relevant Imaging Results:  Relevant Lab Results:   Additional Information SSN: [id number]. Add palliative care  Benard Halsted, LCSW

## 2021-10-05 NOTE — Consult Note (Addendum)
° °  [facility] Inpatient Consult   10/05/2021  Valerie [name] 1945-10-16 [id number]  [place] Organization [ACO] Patient: [name] Medicare PPO  Primary Care Provider:  Roetta Sessions, NP is not a [facility] provider, patient had previously had a [facility] which was noted her provider until 09/13/21 noted in encounters as she is using the providers at [facility] where she currently resides prior to admission   Patient screened from high risk for unplanned readmission list.  Addendum:  Chart review reveals patient to return to facility or a higher level of care review of inpatient [facility] team notes and PT evaluation is ongoing.  No [facility] Care Management is referred as patient's primary care is at the facility.  For questions contact:   Natividad Brood, RN BSN [facility] Liaison  [phone] business mobile phone Toll free office [phone]  Fax number: [phone] [email or web address]

## 2021-10-05 NOTE — TOC Progression Note (Signed)
Transition of Care [facility]) - Progression Note    Patient Details  Name: Valerie [name] MRN: [id number] Date of Birth: 11-01-1945  Transition of Care [facility]) CM/SW [place], [place] Phone Number: 10/05/2021, 5:31 PM  Clinical Narrative:    CSW received notification from [place] at [facility] that they are unable to accept patient back at ALF due to patient being maximum assistance and having a stage II wound (obtained prior to hospitalization).   CSW updated patient's daughter, [name], and she is in agreement to proceed with SNF placement. She is aware that insurance may not approve rehab stay and that it will likely be an out of pocket cost. SNF will require patient to be on day 11 past her COVID + test. CSW will send out referral and email daughter list of facilities for review ([email or web address]). CSW will also contact [facility] to inquire the level of assistance offered by their facility.    Expected Discharge Plan: Skilled Nursing Facility Barriers to Discharge: SNF Pending bed offer, SNF Covid  Expected Discharge Plan and Services Expected Discharge Plan: [place] In-house Referral: Clinical Social Work   Post Acute Care Choice: Resumption of Svcs/PTA Provider Living arrangements for the past 2 months: [place] Expected Discharge Date: 10/04/21                         HH Arranged: RN [place] Agency: [facility] Date [place]: 10/04/21 Time Center: [id number] Representative spoke with at [place]: [place] ([place]) Interventions    Readmission Risk Interventions Readmission Risk Prevention Plan 02/18/2020  Transportation Screening Complete  PCP or Specialist Appt within 5-7 Days Complete  Home Care Screening Patient refused  Medication Review (RN CM) Complete  Some recent data might be hidden

## 2021-10-06 MED ORDER — GERHARDT'S BUTT CREAM
TOPICAL_CREAM | Freq: Two times a day (BID) | CUTANEOUS | Status: DC
  Administered 2021-10-07 – 2021-10-08 (×2): 1 via TOPICAL
  Filled 2021-10-06 (×2): qty 1

## 2021-10-06 NOTE — Consult Note (Addendum)
[employer] Nurse Consult Note: Patient receiving care in [facility] [phone] Consult completed remotely after review of chart and [place] with bedside RN, JUN Reason for Consult: sacral wound Wound type: MASD/IAD of the buttocks, documented on flow sheet as stage 2. Patient is incontinent of urine and stool and now has moisture associated dermatitis Pressure Injury POA: Yes Measurement: Wound bed: Very red and irritated.  Dressing procedure/placement/frequency: Cleanse the area with no rinse cleanser, then apply Gerhardt's butt cream twice daily or PRN soiling. Order and place patient on standard size air mattress for continuous air flow.  ICD-10 CM Codes for Irritant Dermatitis L24A2 - Due to fecal, urinary or dual incontinence L30.4  - Erythema intertrigo. Also used for abrasion of the hand, chafing of the skin, dermatitis due to sweating and friction, friction dermatitis, friction eczema, and genital/thigh intertrigo.   Monitor the wound area(s) for worsening of condition such as: Signs/symptoms of infection, increase in size, development of or worsening of odor, development of pain, or increased pain at the affected locations.   Notify the medical team if any of these develop.  Thank you for the consult. [place] nurse will not follow at this time.   Please re-consult the [place] team if needed.  Cathlean Marseilles Tamala Julian, MSN, RN, [place], [place] Pearl, [facility] Wound Treatment Associate Pager [phone]

## 2021-10-06 NOTE — Progress Notes (Signed)
PROGRESS NOTE  Valerie [name]  GGY:[id number] DOB: December 07, 1945 DOA: 10/01/2021 PCP: Roetta Sessions, NP   Brief Narrative: Patient is a 76 year old female with history of metastatic non-small cell lung cancer currently on not on any treatment, paroxysmal A-fib not on anticoagulation, combined systolic/diastolic congestive heart failure, history of DVT who presents from ALF with concern of left facial droop, dysphagia.  She was found to be confused with slurred speech when family went to visit her at assisted living facility.  She looked more weaker and  was brought to the emergency department.  Presented as a code stroke.  CT head, MRI of the brain did not show any acute intracranial normalities so a stroke has been ruled out.  UA was suggestive of UTI, also found to be COVID-positive.  She is asymptomatic with COVID, not requiring oxygen.  Urine culture showed E. coli, Proteus, both pansensitive.  Palliative care was following during this hospitalization and recommend outpatient follow-up.  PT recommended discharge back to ALF.  She is medically stable for discharge but her ALF does not want to take her back and recommended skilled nursing facility.  PT reconsulted.  TOC following.  Placement pending  Assessment & Plan:  Principal Problem:   Acute metabolic encephalopathy Active Problems:   Dementia ([place])   Congestive heart failure (CHF) (HCC)   DNR (do not resuscitate)   PAF (paroxysmal atrial fibrillation) ([place])   Dysarthria   UTI (urinary tract infection)   COVID-19 virus infection   Assessment and Plan:   Altered mental status/acute metabolic encephalopathy: Likely secondary to COVID, UTI.  Presented with concern for left facial droop, dysarthria, confusion.  Brain imaging is negative for acute findings.  Stroke has been ruled out.  Currently she is alert and oriented   Urinary tract infection: History of recurrent UTI.  UA was suggestive of UTI.  Urine culture showed  pansensitive E. coli, Proteus.  She finished the course of abx   COVID infection: Respiratory status is stable.  Chest x-ray Showed retrocardiac opacity, not sure if there is pneumonia or lung cancer.  Since she was asymptomatic, not started on any treatment but was given Paxlovid .   Paroxysmal A-fib: Not on anticoagulation due to history of hematuria.  Currently in normal sinus rhythm.     History of coronary artery disease: Not on antiplatelet therapy.  No anginal symptoms   History of combined systolic/diastolic congestive heart failure: Currently euvolemic.  Last echo on 03/2020 showed EF of 25 to 03%, grade 1 diastolic dysfunction.   History of dementia: On Aricept   Severe protein calorie malnutrition: We had consulted nutritionist .  Continue oral supplements   Chronic diarrhea: Chronic.GI pathogen panel, C. difficile negative. 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 She was on Tarceva but currently on hold because of side affects.  Oncology has recommended palliative care/hospice referral. Was following with Dr. Julien Nordmann. Palliative care was consulted here, recommended outpatient follow-up   Pressure Injury 09/17/19 Heel Deep Tissue Pressure Injury - Purple or maroon localized area of discolored intact skin or blood-filled blister due to damage of underlying soft tissue from pressure and/or shear. (Active)  09/17/19 0948  Location: Heel  Location Orientation:   Staging: Deep Tissue Pressure Injury - Purple or maroon localized area of discolored intact skin or blood-filled blister due to damage of underlying soft tissue from pressure and/or shear.  Wound Description (Comments):   Present on Admission: Yes     Pressure Injury 06/03/20 Back Right;Left;Mid;Upper;Lower Stage 2 -  Partial thickness loss of dermis presenting as a shallow open injury with a  red, pink wound bed without slough. whole back and buttocks pressure injury.  (Active)  06/03/20 1030  Location: Back  Location Orientation: Right;Left;Mid;Upper;Lower  Staging: Stage 2 -  Partial thickness loss of dermis presenting as a shallow open injury with a red, pink wound bed without slough.  Wound Description (Comments): whole back and buttocks pressure injury.   Present on Admission: Yes     Pressure Injury 10/02/21 Rectum Mid Stage 2 -  Partial thickness loss of dermis presenting as a shallow open injury with a red, pink wound bed without slough. (Active)  10/02/21 2000  Location: Rectum  Location Orientation: Mid  Staging: Stage 2 -  Partial thickness loss of dermis presenting as a shallow open injury with a red, pink wound bed without slough.  Wound Description (Comments):   Present on Admission: Yes  Wound care consulted   Nutrition Problem: Increased nutrient needs Etiology: catabolic illness ([id number], metastatic cancer)    DVT prophylaxis:enoxaparin (LOVENOX) injection 40 mg Start: 10/02/21 1000     Code Status: DNR  Family Communication: Daughter on phone on 2/15  Patient status: Inpatient  Patient is from : ALF  Anticipated discharge to: ALF versus skilled nursing facility   Estimated DC date: whenever possible   Consultants: Palliative care  Procedures: None  Antimicrobials:  Anti-infectives (From admission, onward)    Start     Dose/Rate Route Frequency Ordered Stop   10/04/21 0000  cephALEXin (KEFLEX) 500 MG capsule  Status:  Discontinued        500 mg Oral 4 times daily 10/04/21 1307 10/06/21    10/04/21 0000  nirmatrelvir/ritonavir EUA, renal dosing, (PAXLOVID) 10 x 150 MG & 10 x 100MG  TABS  Status:  Discontinued        2 tablet Oral 2 times daily 10/04/21 1325 10/06/21    10/01/21 2345  nirmatrelvir/ritonavir EUA (PAXLOVID) 3 tablet  Status:  Discontinued        3 tablet Oral 2 times daily 10/01/21 2331 10/01/21 2342   10/01/21 2345   cefTRIAXone (ROCEPHIN) 1 g in sodium chloride 0.9 % 100 mL IVPB  Status:  Discontinued        1 g 200 mL/hr over 30 Minutes Intravenous Every 24 hours 10/01/21 2334 10/06/21 1135   10/01/21 2345  nirmatrelvir/ritonavir EUA (renal dosing) (PAXLOVID) 2 tablet        2 tablet Oral 2 times daily 10/01/21 2342 10/07/21 0359       Subjective: Patient seen and examined at the bedside this morning.  Hemodynamically stable.  Comfortable.  Eating her breakfast.  Denies abdominal pain, nausea or vomiting.  Has been having intermittent diarrhea but this is chronic.  Objective: Vitals:   10/06/21 0000 10/06/21 0355 10/06/21 0750 10/06/21 1100  BP: (!) 120/56 (!) 124/57 114/62 (!) 120/57  Pulse: 89 85 73 73  Resp: 18 16 16 16   Temp: 98.2 F (36.8 C) 97.8 F (36.6 C) 98.5 F (36.9 C) 98 F (36.7 C)  TempSrc: Oral Oral Oral Oral  SpO2: 95% 96% 98% 98%  Weight:      Height:        Intake/Output Summary (Last 24 hours) at 10/06/2021 1136 Last data filed at 10/06/2021 1100 Gross per 24 hour  Intake 250 ml  Output --  Net  250 ml   Filed Weights   10/01/21 1809  Weight: 74.6 kg    Examination:   General exam: Very deconditioned, chronically looking, weak, loudest HEENT: PERRL Respiratory system:  no wheezes or crackles  Cardiovascular system: S1 & S2 heard, RRR.  Gastrointestinal system: Abdomen is nondistended, soft and nontender. Central nervous system: Alert and oriented Extremities: No edema, no clubbing ,no cyanosis Skin: No rashes,no icterus, pressure ulcers as above  Data Reviewed: I have personally reviewed following labs and imaging studies  CBC: Recent Labs  Lab 10/01/21 1811 10/02/21 0430  WBC 8.7 8.0  NEUTROABS 7.3  --   HGB 11.9*   13.3 11.7*  HCT 39.7   39.0 37.0  MCV 92.3 90.0  PLT 239 003   Basic Metabolic Panel: Recent Labs  Lab 10/01/21 1811 10/02/21 0430  NA 136   138 137  K 3.7   3.9 3.5  CL 105   106 106  CO2 22 21*  GLUCOSE 115*   112* 114*   BUN 14   18 16   CREATININE 1.00   0.90 0.97  CALCIUM 8.9 8.9     Recent Results (from the past 240 hour(s))  Resp Panel by RT-PCR (Flu A&B, Covid) Nasopharyngeal Swab     Status: Abnormal   Collection Time: 10/01/21  6:45 PM   Specimen: Nasopharyngeal Swab; Nasopharyngeal(NP) swabs in vial transport medium  Result Value Ref Range Status   SARS Coronavirus 2 by RT PCR POSITIVE (A) NEGATIVE Final    Comment: (NOTE) SARS-CoV-2 target nucleic acids are DETECTED.  The SARS-CoV-2 RNA is generally detectable in upper respiratory specimens during the acute phase of infection. Positive results are indicative of the presence of the identified virus, but do not rule out bacterial infection or co-infection with other pathogens not detected by the test. Clinical correlation with patient history and other diagnostic information is necessary to determine patient infection status. The expected result is Negative.  Fact Sheet for Patients: EntrepreneurPulse.com.au  Fact Sheet for Healthcare Providers: IncredibleEmployment.be  This test is not yet approved or cleared by the [place] FDA and  has been authorized for detection and/or diagnosis of SARS-CoV-2 by FDA under an Emergency Use Authorization (EUA).  This EUA will remain in effect (meaning this test can be used) for the duration of  the COVID-19 declaration under Section 564(b)(1) of the A ct, 21 U.S.C. section 360bbb-3(b)(1), unless the authorization is terminated or revoked sooner.     Influenza A by PCR NEGATIVE NEGATIVE Final   Influenza B by PCR NEGATIVE NEGATIVE Final    Comment: (NOTE) The Xpert Xpress SARS-CoV-2/FLU/RSV plus assay is intended as an aid in the diagnosis of influenza from Nasopharyngeal swab specimens and should not be used as a sole basis for treatment. Nasal washings and aspirates are unacceptable for Xpert Xpress SARS-CoV-2/FLU/RSV testing.  Fact Sheet for  Patients: EntrepreneurPulse.com.au  Fact Sheet for Healthcare Providers: IncredibleEmployment.be  This test is not yet approved or cleared by the [place] FDA and has been authorized for detection and/or diagnosis of SARS-CoV-2 by FDA under an Emergency Use Authorization (EUA). This EUA will remain in effect (meaning this test can be used) for the duration of the COVID-19 declaration under Section 564(b)(1) of the Act, 21 U.S.C. section 360bbb-3(b)(1), unless the authorization is terminated or revoked.  Performed at [facility], [place] [address] [place], [place] [address]   Urine Culture     Status: Abnormal   Collection Time: 10/01/21  9:11  PM   Specimen: Urine, Clean Catch  Result Value Ref Range Status   Specimen Description URINE, CLEAN CATCH  Final   Special Requests   Final    added 2111 Performed at [facility], [place] [address] [place], [place] [address]    Culture (A)  Final    >=100,000 COLONIES/mL ESCHERICHIA COLI 70,000 COLONIES/mL PROTEUS MIRABILIS    Report Status 10/04/2021 FINAL  Final   Organism ID, Bacteria ESCHERICHIA COLI (A)  Final   Organism ID, Bacteria PROTEUS MIRABILIS (A)  Final      Susceptibility   Escherichia coli - MIC*    AMPICILLIN 8 SENSITIVE Sensitive     CEFAZOLIN <=4 SENSITIVE Sensitive     CEFEPIME <=0.12 SENSITIVE Sensitive     CEFTRIAXONE <=0.25 SENSITIVE Sensitive     CIPROFLOXACIN >=4 RESISTANT Resistant     GENTAMICIN >=16 RESISTANT Resistant     IMIPENEM <=0.25 SENSITIVE Sensitive     NITROFURANTOIN <=16 SENSITIVE Sensitive     TRIMETH/SULFA <=20 SENSITIVE Sensitive     AMPICILLIN/SULBACTAM <=2 SENSITIVE Sensitive     PIP/TAZO <=4 SENSITIVE Sensitive     * >=100,000 COLONIES/mL ESCHERICHIA COLI   Proteus mirabilis - MIC*    AMPICILLIN <=2 SENSITIVE Sensitive     CEFAZOLIN <=4 SENSITIVE Sensitive     CEFEPIME <=0.12 SENSITIVE Sensitive     CEFTRIAXONE <=0.25 SENSITIVE  Sensitive     CIPROFLOXACIN <=0.25 SENSITIVE Sensitive     GENTAMICIN <=1 SENSITIVE Sensitive     IMIPENEM 2 SENSITIVE Sensitive     NITROFURANTOIN 128 RESISTANT Resistant     TRIMETH/SULFA <=20 SENSITIVE Sensitive     AMPICILLIN/SULBACTAM <=2 SENSITIVE Sensitive     PIP/TAZO <=4 SENSITIVE Sensitive     * 70,000 COLONIES/mL PROTEUS MIRABILIS  C Difficile Quick Screen w PCR reflex     Status: None   Collection Time: 10/03/21  9:32 PM   Specimen: Stool  Result Value Ref Range Status   C Diff antigen NEGATIVE NEGATIVE Final   C Diff toxin NEGATIVE NEGATIVE Final   C Diff interpretation No C. difficile detected.  Final    Comment: Performed at [facility], [place] [address] [place], [place] [address]  Gastrointestinal Panel by PCR , Stool     Status: None   Collection Time: 10/03/21  9:32 PM   Specimen: Stool  Result Value Ref Range Status   Campylobacter species NOT DETECTED NOT DETECTED Final   Plesimonas shigelloides NOT DETECTED NOT DETECTED Final   Salmonella species NOT DETECTED NOT DETECTED Final   Yersinia enterocolitica NOT DETECTED NOT DETECTED Final   Vibrio species NOT DETECTED NOT DETECTED Final   Vibrio cholerae NOT DETECTED NOT DETECTED Final   Enteroaggregative E coli (EAEC) NOT DETECTED NOT DETECTED Final   Enteropathogenic E coli (EPEC) NOT DETECTED NOT DETECTED Final   Enterotoxigenic E coli (ETEC) NOT DETECTED NOT DETECTED Final   Shiga like toxin producing E coli (STEC) NOT DETECTED NOT DETECTED Final   Shigella/Enteroinvasive E coli (EIEC) NOT DETECTED NOT DETECTED Final   Cryptosporidium NOT DETECTED NOT DETECTED Final   Cyclospora cayetanensis NOT DETECTED NOT DETECTED Final   Entamoeba histolytica NOT DETECTED NOT DETECTED Final   Giardia lamblia NOT DETECTED NOT DETECTED Final   Adenovirus F40/41 NOT DETECTED NOT DETECTED Final   Astrovirus NOT DETECTED NOT DETECTED Final   Norovirus GI/GII NOT DETECTED NOT DETECTED Final   Rotavirus A NOT DETECTED  NOT DETECTED Final   Sapovirus (I, II,  IV, and V) NOT DETECTED NOT DETECTED Final    Comment: Performed at [facility], [address] [place], [place] [address]     Radiology Studies: No results found.  Scheduled Meds:  donepezil  10 mg Oral QHS   enoxaparin (LOVENOX) injection  40 mg Subcutaneous Q24H   feeding supplement  237 mL Oral BID BM   nirmatrelvir/ritonavir EUA (renal dosing)  2 tablet Oral BID   saccharomyces boulardii  250 mg Oral BID   Continuous Infusions:     LOS: 3 days   Shelly Coss, MD [facility] P2/17/2023, 11:36 AM

## 2021-10-06 NOTE — TOC Progression Note (Signed)
Transition of Care [facility]) - Progression Note    Patient Details  Name: Valerie [name] MRN: [id number] Date of Birth: 10/08/1945  Transition of Care [facility]) CM/SW [place], LCSW Phone Number: 10/06/2021, 6:06 PM  Clinical Narrative:    CSW spoke with Shazma at [facility] and she stated that they do work with patients that are bed-bound or need hoyer lifts. CSW faxed clinicals to her ([phone]. [phone]). She reviewed them and stated they can do an assessment on Monday if daughter would like them too.  CSW spoke with [name] and she requested they come assess patient as it would be more cost effective than SNF. CSW spoke with [facility] and she will schedule assessment for Monday.   Expected Discharge Plan: Skilled Nursing Facility Barriers to Discharge: SNF Pending bed offer, SNF Covid  Expected Discharge Plan and Services Expected Discharge Plan: [place] In-house Referral: Clinical Social Work   Post Acute Care Choice: Resumption of Svcs/PTA Provider Living arrangements for the past 2 months: [place] Expected Discharge Date: 10/04/21                         HH Arranged: RN [place] Agency: [facility] Date [place]: 10/04/21 Time [place]: [id number] Representative spoke with at [place]: [place] ([place]) Interventions    Readmission Risk Interventions Readmission Risk Prevention Plan 02/18/2020  Transportation Screening Complete  PCP or Specialist Appt within 5-7 Days Complete  Home Care Screening Patient refused  Medication Review (RN CM) Complete  Some recent data might be hidden

## 2021-10-06 NOTE — Evaluation (Signed)
Physical Therapy Evaluation Patient Details Name: Valerie [name] MRN: [id number] DOB: September 29, 1945 Today's Date: 10/06/2021  History of Present Illness  76 y.o. female with medical history significant of lung cancer with brain metastasis, paroxysmal atrial fibrillation not on anticoagulation, combined CHF, history of DVT who presents with concerns of left facial droop and dysphagia. 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 She was evaluated and discharged on Tuesday as she appeared close to baseline, and was at ALF prior to admission. She demonstrates increased weakness since then. Patient required max assist for supine to sit and was unable to achieve independent sitting balance on edge of bed. Heavy posterior leaning when attempting to sit. Also limited by pain due to wounds on bottom.  She requires mod assist to return to supine from sitting. Unable to lift legs to bring them back onto bed. Patient is unable to attempt standing at this time due to poor sitting balance.  Patient needs higher level of care at this time. Recommending SNF at this time to improve strength and mobility for potential return to ALF.             Recommendations for follow up therapy are one component of a multi-disciplinary discharge planning process, led by the attending physician.  Recommendations may be updated based on patient status, additional functional criteria and insurance authorization.  Follow Up Recommendations Skilled nursing-short term rehab (<3 hours/day)    Assistance Recommended at Discharge Frequent or constant Supervision/Assistance  Patient can return home with the following  Two people to help with walking and/or transfers;A lot of help with bathing/dressing/bathroom;Assistance with  cooking/housework;Direct supervision/assist for medications management;Direct supervision/assist for financial management;Assist for transportation;Help with stairs or ramp for entrance    Equipment Recommendations None recommended by PT;Other (comment) (TBD)  Recommendations for Other Services       Functional Status Assessment Patient has had a recent decline in their functional status and demonstrates the ability to make significant improvements in function in a reasonable and predictable amount of time.     Precautions / Restrictions Precautions Precautions: Fall Precaution Comments: wounds on bottom Restrictions Weight Bearing Restrictions: No      Mobility  Bed Mobility Overal bed mobility: Needs Assistance Bed Mobility: Supine to Sit, Sit to Supine     Supine to sit: Max assist Sit to supine: Mod assist   General bed mobility comments: Patient required max assist to raise trunk to seated position. Unable to keep self upright without B UE support. Max assist needed to scoot forward to edge of bed. Pain limited and weak.    Transfers                   General transfer comment: states she is unable to stand without shoes. No shoes in room. patient would require max assist to even attempt to stand as she is unable to balance in sitting. Heavy posterior lean    Ambulation/Gait               General Gait Details: unable  Stairs            Wheelchair Mobility    Modified Rankin (Stroke Patients Only)       Balance Overall balance assessment: Needs assistance Sitting-balance support: Feet supported Sitting balance-Leahy Scale: Zero Sitting balance - Comments:  posterior lean. Able to partially sit up with both hands holding to walker and me holding walker down on floor. Falls backward if no UE support Postural control: Posterior lean   Standing balance-Leahy Scale: Zero                               Pertinent Vitals/Pain Pain  Assessment Pain Assessment: Faces Faces Pain Scale: Hurts whole lot Pain Location: bottom Pain Descriptors / Indicators: Guarding, Grimacing, Discomfort, Sore Pain Intervention(s): Monitored during session, Repositioned    Home Living Family/patient expects to be discharged to:: Skilled nursing facility                   Additional Comments: Patient at ALF prior to admission here. her husband died at the end of 06/10/23. Daughters involved in care.    Prior Function Prior Level of Function : Needs assist             Mobility Comments: pt reports she could sit on EOB and with 2 people was able to pivot to bed, toilet and wheelchair ADLs Comments: pt reports that she was Ind with UB ADLs, required assist with LB ADLs and toileting     Hand Dominance   Dominant Hand: Right    Extremity/Trunk Assessment   Upper Extremity Assessment Upper Extremity Assessment: Generalized weakness    Lower Extremity Assessment Lower Extremity Assessment: Generalized weakness RLE Deficits / Details: gerenal weakness bilaterally. mod assist to bring LEs back up onto bed from sitting position.    Cervical / Trunk Assessment Cervical / Trunk Assessment: Kyphotic  Communication   Communication: No difficulties  Cognition Arousal/Alertness: Awake/alert Behavior During Therapy: WFL for tasks assessed/performed Overall Cognitive Status: No family/caregiver present to determine baseline cognitive functioning                                          General Comments      Exercises     Assessment/Plan    PT Assessment Patient needs continued PT services  PT Problem List Decreased strength;Decreased mobility;Decreased activity tolerance;Decreased balance;Pain;Decreased cognition;Decreased knowledge of use of DME;Decreased knowledge of precautions;Decreased safety awareness       PT Treatment Interventions DME instruction;Therapeutic exercise;Functional mobility  training;Therapeutic activities;Patient/family education;Balance training;Gait training    PT Goals (Current goals can be found in the Care Plan section)  Acute Rehab PT Goals Patient Stated Goal: to decrease pain in bottom PT Goal Formulation: With patient Time For Goal Achievement: 10/17/21 Potential to Achieve Goals: Fair    Frequency Min 2X/week     Co-evaluation               AM-PAC PT "6 Clicks" Mobility  Outcome Measure Help needed turning from your back to your side while in a flat bed without using bedrails?: A Lot Help needed moving from lying on your back to sitting on the side of a flat bed without using bedrails?: A Lot Help needed moving to and from a bed to a chair (including a wheelchair)?: Total Help needed standing up from a chair using your arms (e.g., wheelchair or bedside chair)?: Total Help needed to walk in hospital room?: Total Help needed climbing 3-5 steps with a railing? : Total 6 Click Score: 8    End of Session   Activity Tolerance: Patient limited by lethargy;Patient limited  by pain Patient left: in bed;with call bell/phone within reach Nurse Communication: Mobility status PT Visit Diagnosis: Muscle weakness (generalized) (M62.81);Other abnormalities of gait and mobility (R26.89);Pain Pain - part of body:  (bottom)    Time: 1437-1450 PT Time Calculation (min) (ACUTE ONLY): 13 min   Charges:   PT Evaluation $PT Eval Moderate Complexity: 1 Mod          Catlin Doria, PT, GCS 10/06/21,3:10 PM

## 2021-10-07 LAB — CBC WITH DIFFERENTIAL/PLATELET
Abs Immature Granulocytes: 0.02 10*3/uL (ref 0.00–0.07)
Basophils Absolute: 0 10*3/uL (ref 0.0–0.1)
Basophils Relative: 0 %
Eosinophils Absolute: 0.3 10*3/uL (ref 0.0–0.5)
Eosinophils Relative: 4 %
HCT: 34.3 % — ABNORMAL LOW (ref 36.0–46.0)
Hemoglobin: 10.6 g/dL — ABNORMAL LOW (ref 12.0–15.0)
Immature Granulocytes: 0 %
Lymphocytes Relative: 15 %
Lymphs Abs: 1.3 10*3/uL (ref 0.7–4.0)
MCH: 27.8 pg (ref 26.0–34.0)
MCHC: 30.9 g/dL (ref 30.0–36.0)
MCV: 90 fL (ref 80.0–100.0)
Monocytes Absolute: 0.6 10*3/uL (ref 0.1–1.0)
Monocytes Relative: 7 %
Neutro Abs: 6.1 10*3/uL (ref 1.7–7.7)
Neutrophils Relative %: 74 %
Platelets: 252 10*3/uL (ref 150–400)
RBC: 3.81 MIL/uL — ABNORMAL LOW (ref 3.87–5.11)
RDW: 14.2 % (ref 11.5–15.5)
WBC: 8.3 10*3/uL (ref 4.0–10.5)
nRBC: 0 % (ref 0.0–0.2)

## 2021-10-07 LAB — BASIC METABOLIC PANEL
Anion gap: 9 (ref 5–15)
BUN: 19 mg/dL (ref 8–23)
CO2: 23 mmol/L (ref 22–32)
Calcium: 8.9 mg/dL (ref 8.9–10.3)
Chloride: 104 mmol/L (ref 98–111)
Creatinine, Ser: 0.87 mg/dL (ref 0.44–1.00)
GFR, Estimated: >60 mL/min (ref >60)
Glucose, Bld: 100 mg/dL — ABNORMAL HIGH (ref 70–99)
Potassium: 3.7 mmol/L (ref 3.5–5.1)
Sodium: 136 mmol/L (ref 135–145)

## 2021-10-07 LAB — GLUCOSE, CAPILLARY: Glucose-Capillary: 99 mg/dL (ref 70–99)

## 2021-10-07 NOTE — Progress Notes (Addendum)
PROGRESS NOTE  Valerie [name]  CNO:[id number] DOB: 1945/12/28 DOA: 10/01/2021 PCP: Roetta Sessions, NP   Brief Narrative: Patient is a 76 year old female with history of metastatic non-small cell lung cancer currently on not on any treatment, paroxysmal A-fib not on anticoagulation, combined systolic/diastolic congestive heart failure, history of DVT who presents from ALF with concern of left facial droop, dysphagia.  She was found to be confused with slurred speech when family went to visit her at assisted living facility.  She looked more weaker and  was brought to the emergency department.  Presented as a code stroke.  CT head, MRI of the brain did not show any acute intracranial normalities so a stroke has been ruled out.  UA was suggestive of UTI, also found to be COVID-positive.  She is asymptomatic with COVID, not requiring oxygen.  Urine culture showed E. coli, Proteus, both pansensitive.  Palliative care was following during this hospitalization and recommend outpatient follow-up.  PT recommended discharge back to ALF.    Patient remains medically stable for discharge back to ALF but they are declining admission for SNF admission. PT reconsulted. TOC following.  Placement pending SNF acceptance.  Assessment & Plan:  Principal Problem:   Acute metabolic encephalopathy Active Problems:   Dementia ([place])   Congestive heart failure (CHF) ([facility])   DNR (do not resuscitate)   PAF (paroxysmal atrial fibrillation) ([place])   Dysarthria   UTI (urinary tract infection)   COVID-19 virus infection   Assessment and Plan:   Altered mental status/acute metabolic encephalopathy, resolved: Likely secondary to COVID, UTI.  Presented with concern for left facial droop, dysarthria, confusion.  Brain imaging is negative for acute findings.  Stroke has been ruled out.  Currently she is alert and oriented   Urinary tract infection: History of recurrent UTI.  UA was suggestive of UTI.  Urine  culture showed pansensitive E. coli, Proteus.  She completed antibiotic course.   COVID infection, incidental, no indication for treatment; possibly non-acute testing:  Respiratory status is stable.  Chest x-ray Showed retrocardiac opacity, not sure if there is pneumonia or lung cancer.  Since she was asymptomatic, not started on any treatment but was given Paxlovid .   Paroxysmal A-fib: Not on anticoagulation due to history of hematuria.  Currently in normal sinus rhythm.     History of coronary artery disease: Not on antiplatelet therapy.  No anginal symptoms   History of combined systolic/diastolic congestive heart failure: Currently euvolemic.  Last echo on 03/2020 showed EF of 25 to 29%, grade 1 diastolic dysfunction.   History of dementia: On Aricept   Severe protein calorie malnutrition: We had consulted nutritionist .  Continue oral supplements   Chronic diarrhea: Chronic.GI pathogen panel, C. difficile negative. 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 She was on Tarceva but currently on hold because of side affects.  Oncology has recommended palliative care/hospice referral. Was following with Dr. Julien Nordmann. Palliative care was consulted here, recommended outpatient follow-up   Pressure Injury 09/17/19 Heel Deep Tissue Pressure Injury - Purple or maroon localized area of discolored intact skin or blood-filled blister due to damage of underlying soft tissue from pressure and/or shear. (Active)  09/17/19 0948  Location: Heel  Location Orientation:   Staging: Deep Tissue Pressure Injury - Purple or maroon localized area of discolored intact skin or blood-filled blister due to damage of underlying  soft tissue from pressure and/or shear.  Wound Description (Comments):   Present on Admission: Yes     Pressure Injury 06/03/20 Back Right;Left;Mid;Upper;Lower  Stage 2 -  Partial thickness loss of dermis presenting as a shallow open injury with a red, pink wound bed without slough. whole back and buttocks pressure injury.  (Active)  06/03/20 1030  Location: Back  Location Orientation: Right;Left;Mid;Upper;Lower  Staging: Stage 2 -  Partial thickness loss of dermis presenting as a shallow open injury with a red, pink wound bed without slough.  Wound Description (Comments): whole back and buttocks pressure injury.   Present on Admission: Yes     Pressure Injury 10/02/21 Rectum Mid Stage 2 -  Partial thickness loss of dermis presenting as a shallow open injury with a red, pink wound bed without slough. (Active)  10/02/21 2000  Location: Rectum  Location Orientation: Mid  Staging: Stage 2 -  Partial thickness loss of dermis presenting as a shallow open injury with a red, pink wound bed without slough.  Wound Description (Comments):   Present on Admission: Yes  Wound care consulted   Nutrition Problem: Increased nutrient needs Etiology: catabolic illness (QHUTM-54, metastatic cancer)    DVT prophylaxis:enoxaparin (LOVENOX) injection 40 mg Start: 10/02/21 1000     Code Status: DNR  Family Communication: Daughter on phone on 2/15  Patient status: Inpatient  Patient is from : ALF  Anticipated discharge to: ALF versus skilled nursing facility   Estimated DC date: whenever possible   Consultants: Palliative care  Procedures: None  Antimicrobials:  Anti-infectives (From admission, onward)    Start     Dose/Rate Route Frequency Ordered Stop   10/04/21 0000  cephALEXin (KEFLEX) 500 MG capsule  Status:  Discontinued        500 mg Oral 4 times daily 10/04/21 1307 10/06/21    10/04/21 0000  nirmatrelvir/ritonavir EUA, renal dosing, (PAXLOVID) 10 x 150 MG & 10 x 100MG  TABS  Status:  Discontinued        2 tablet Oral 2 times daily 10/04/21 1325 10/06/21    10/01/21 2345  nirmatrelvir/ritonavir EUA (PAXLOVID) 3 tablet  Status:  Discontinued         3 tablet Oral 2 times daily 10/01/21 2331 10/01/21 2342   10/01/21 2345  cefTRIAXone (ROCEPHIN) 1 g in sodium chloride 0.9 % 100 mL IVPB  Status:  Discontinued        1 g 200 mL/hr over 30 Minutes Intravenous Every 24 hours 10/01/21 2334 10/06/21 1135   10/01/21 2345  nirmatrelvir/ritonavir EUA (renal dosing) (PAXLOVID) 2 tablet        2 tablet Oral 2 times daily 10/01/21 2342 10/06/21 1633       Subjective: Patient seen and examined at the bedside this morning.  Hemodynamically stable.  Comfortable.  Eating her breakfast.  Denies abdominal pain, nausea or vomiting.  Has been having intermittent diarrhea but this is chronic.  Objective: Vitals:   10/06/21 1536 10/06/21 2137 10/07/21 0036 10/07/21 0525  BP: (!) 101/55 121/62 (!) 99/54 118/63  Pulse:  78 78 90  Resp: 16 18 17 17   Temp: 97.9 F (36.6 C) (!) 97.1 F (36.2 C) 98.2 F (36.8 C) 97.8 F (36.6 C)  TempSrc: Oral Oral Oral Oral  SpO2: 98% 97% 96% 96%  Weight:      Height:        Intake/Output Summary (Last 24 hours) at 10/07/2021 0746 Last data filed at 10/06/2021 1400 Gross per 24 hour  Intake  3589.66 ml  Output --  Net 3589.66 ml    Filed Weights   10/01/21 1809  Weight: 74.6 kg    Examination:   General exam: Very deconditioned, chronically looking, weak, loudest HEENT: PERRL Respiratory system:  no wheezes or crackles  Cardiovascular system: S1 & S2 heard, RRR.  Gastrointestinal system: Abdomen is nondistended, soft and nontender. Central nervous system: Alert and oriented Extremities: No edema, no clubbing ,no cyanosis Skin: No rashes,no icterus, pressure ulcers as above  Data Reviewed: I have personally reviewed following labs and imaging studies  CBC: Recent Labs  Lab 10/01/21 1811 10/02/21 0430 10/07/21 0154  WBC 8.7 8.0 8.3  NEUTROABS 7.3  --  6.1  HGB 11.9*   13.3 11.7* 10.6*  HCT 39.7   39.0 37.0 34.3*  MCV 92.3 90.0 90.0  PLT 239 236 502    Basic Metabolic Panel: Recent  Labs  Lab 10/01/21 1811 10/02/21 0430 10/07/21 0154  NA 136   138 137 136  K 3.7   3.9 3.5 3.7  CL 105   106 106 104  CO2 22 21* 23  GLUCOSE 115*   112* 114* 100*  BUN 14   18 16 19   CREATININE 1.00   0.90 0.97 0.87  CALCIUM 8.9 8.9 8.9      Recent Results (from the past 240 hour(s))  Resp Panel by RT-PCR (Flu A&B, Covid) Nasopharyngeal Swab     Status: Abnormal   Collection Time: 10/01/21  6:45 PM   Specimen: Nasopharyngeal Swab; Nasopharyngeal(NP) swabs in vial transport medium  Result Value Ref Range Status   SARS Coronavirus 2 by RT PCR POSITIVE (A) NEGATIVE Final    Comment: (NOTE) SARS-CoV-2 target nucleic acids are DETECTED.  The SARS-CoV-2 RNA is generally detectable in upper respiratory specimens during the acute phase of infection. Positive results are indicative of the presence of the identified virus, but do not rule out bacterial infection or co-infection with other pathogens not detected by the test. Clinical correlation with patient history and other diagnostic information is necessary to determine patient infection status. The expected result is Negative.  Fact Sheet for Patients: EntrepreneurPulse.com.au  Fact Sheet for Healthcare Providers: IncredibleEmployment.be  This test is not yet approved or cleared by the [place] FDA and  has been authorized for detection and/or diagnosis of SARS-CoV-2 by FDA under an Emergency Use Authorization (EUA).  This EUA will remain in effect (meaning this test can be used) for the duration of  the COVID-19 declaration under Section 564(b)(1) of the A ct, 21 U.S.C. section 360bbb-3(b)(1), unless the authorization is terminated or revoked sooner.     Influenza A by PCR NEGATIVE NEGATIVE Final   Influenza B by PCR NEGATIVE NEGATIVE Final    Comment: (NOTE) The Xpert Xpress SARS-CoV-2/FLU/RSV plus assay is intended as an aid in the diagnosis of influenza from Nasopharyngeal  swab specimens and should not be used as a sole basis for treatment. Nasal washings and aspirates are unacceptable for Xpert Xpress SARS-CoV-2/FLU/RSV testing.  Fact Sheet for Patients: EntrepreneurPulse.com.au  Fact Sheet for Healthcare Providers: IncredibleEmployment.be  This test is not yet approved or cleared by the [place] FDA and has been authorized for detection and/or diagnosis of SARS-CoV-2 by FDA under an Emergency Use Authorization (EUA). This EUA will remain in effect (meaning this test can be used) for the duration of the COVID-19 declaration under Section 564(b)(1) of the Act, 21 U.S.C. section 360bbb-3(b)(1), unless the authorization is terminated or revoked.  Performed at  [facility], [place] [address], [place], [place] [address]   Urine Culture     Status: Abnormal   Collection Time: 10/01/21  9:11 PM   Specimen: Urine, Clean Catch  Result Value Ref Range Status   Specimen Description URINE, CLEAN CATCH  Final   Special Requests   Final    added 2111 Performed at [facility], [place] [address] [place], [place] [address]    Culture (A)  Final    >=100,000 COLONIES/mL ESCHERICHIA COLI 70,000 COLONIES/mL PROTEUS MIRABILIS    Report Status 10/04/2021 FINAL  Final   Organism ID, Bacteria ESCHERICHIA COLI (A)  Final   Organism ID, Bacteria PROTEUS MIRABILIS (A)  Final      Susceptibility   Escherichia coli - MIC*    AMPICILLIN 8 SENSITIVE Sensitive     CEFAZOLIN <=4 SENSITIVE Sensitive     CEFEPIME <=0.12 SENSITIVE Sensitive     CEFTRIAXONE <=0.25 SENSITIVE Sensitive     CIPROFLOXACIN >=4 RESISTANT Resistant     GENTAMICIN >=16 RESISTANT Resistant     IMIPENEM <=0.25 SENSITIVE Sensitive     NITROFURANTOIN <=16 SENSITIVE Sensitive     TRIMETH/SULFA <=20 SENSITIVE Sensitive     AMPICILLIN/SULBACTAM <=2 SENSITIVE Sensitive     PIP/TAZO <=4 SENSITIVE Sensitive     * >=100,000 COLONIES/mL ESCHERICHIA COLI    Proteus mirabilis - MIC*    AMPICILLIN <=2 SENSITIVE Sensitive     CEFAZOLIN <=4 SENSITIVE Sensitive     CEFEPIME <=0.12 SENSITIVE Sensitive     CEFTRIAXONE <=0.25 SENSITIVE Sensitive     CIPROFLOXACIN <=0.25 SENSITIVE Sensitive     GENTAMICIN <=1 SENSITIVE Sensitive     IMIPENEM 2 SENSITIVE Sensitive     NITROFURANTOIN 128 RESISTANT Resistant     TRIMETH/SULFA <=20 SENSITIVE Sensitive     AMPICILLIN/SULBACTAM <=2 SENSITIVE Sensitive     PIP/TAZO <=4 SENSITIVE Sensitive     * 70,000 COLONIES/mL PROTEUS MIRABILIS  C Difficile Quick Screen w PCR reflex     Status: None   Collection Time: 10/03/21  9:32 PM   Specimen: Stool  Result Value Ref Range Status   C Diff antigen NEGATIVE NEGATIVE Final   C Diff toxin NEGATIVE NEGATIVE Final   C Diff interpretation No C. difficile detected.  Final    Comment: Performed at [facility], [place] [address] [place], [place] [address]  Gastrointestinal Panel by PCR , Stool     Status: None   Collection Time: 10/03/21  9:32 PM   Specimen: Stool  Result Value Ref Range Status   Campylobacter species NOT DETECTED NOT DETECTED Final   Plesimonas shigelloides NOT DETECTED NOT DETECTED Final   Salmonella species NOT DETECTED NOT DETECTED Final   Yersinia enterocolitica NOT DETECTED NOT DETECTED Final   Vibrio species NOT DETECTED NOT DETECTED Final   Vibrio cholerae NOT DETECTED NOT DETECTED Final   Enteroaggregative E coli (EAEC) NOT DETECTED NOT DETECTED Final   Enteropathogenic E coli (EPEC) NOT DETECTED NOT DETECTED Final   Enterotoxigenic E coli (ETEC) NOT DETECTED NOT DETECTED Final   Shiga like toxin producing E coli (STEC) NOT DETECTED NOT DETECTED Final   Shigella/Enteroinvasive E coli (EIEC) NOT DETECTED NOT DETECTED Final   Cryptosporidium NOT DETECTED NOT DETECTED Final   Cyclospora cayetanensis NOT DETECTED NOT DETECTED Final   Entamoeba histolytica NOT DETECTED NOT DETECTED Final   Giardia lamblia NOT DETECTED NOT DETECTED Final    Adenovirus F40/41 NOT DETECTED NOT DETECTED Final   Astrovirus  NOT DETECTED NOT DETECTED Final   Norovirus GI/GII NOT DETECTED NOT DETECTED Final   Rotavirus A NOT DETECTED NOT DETECTED Final   Sapovirus (I, II, IV, and V) NOT DETECTED NOT DETECTED Final    Comment: Performed at [facility], [address], [place],  [address]      Radiology Studies: No results found.  Scheduled Meds:  donepezil  10 mg Oral QHS   enoxaparin (LOVENOX) injection  40 mg Subcutaneous Q24H   feeding supplement  237 mL Oral BID BM   Gerhardt's butt cream   Topical BID   saccharomyces boulardii  250 mg Oral BID   Continuous Infusions:     LOS: 4 days   Little Ishikawa, DO [facility] P2/18/2023, 7:46 AM

## 2021-10-08 NOTE — Progress Notes (Signed)
PROGRESS NOTE  Valerie [name]  ACZ:[id number] DOB: [birth year] DOA: 10/01/2021 PCP: Roetta Sessions, NP   Brief Narrative: Patient is a 76 year old female with history of metastatic non-small cell lung cancer currently on not on any treatment, paroxysmal A-fib not on anticoagulation, combined systolic/diastolic congestive heart failure, history of DVT who presents from ALF with concern of left facial droop, dysphagia.  She was found to be confused with slurred speech when family went to visit her at assisted living facility.  She looked more weaker and  was brought to the emergency department.  Presented as a code stroke.  CT head, MRI of the brain did not show any acute intracranial normalities so a stroke has been ruled out.  UA was suggestive of UTI, also found to be COVID-positive.  She is asymptomatic with COVID, not requiring oxygen.  Urine culture showed E. coli, Proteus, both pansensitive.  Palliative care was following during this hospitalization and recommend outpatient follow-up.  PT recommended discharge back to ALF.    Patient remains medically stable for discharge back to ALF but they are declining admission for SNF admission. PT reconsulted. TOC following.  Placement pending SNF acceptance.  Assessment & Plan:  Principal Problem:   Acute metabolic encephalopathy Active Problems:   Dementia ([place])   Congestive heart failure (CHF) ([facility])   DNR (do not resuscitate)   PAF (paroxysmal atrial fibrillation) ([place])   Dysarthria   UTI (urinary tract infection)   COVID-19 virus infection  Assessment and Plan:  Altered mental status/acute metabolic encephalopathy, resolved: Likely secondary to COVID, UTI.  Presented with concern for left facial droop, dysarthria, confusion.  Brain imaging is negative for acute findings.  Stroke has been ruled out.  Currently she is alert and oriented   Urinary tract infection: History of recurrent UTI.  UA was suggestive of UTI.  Urine culture  showed pansensitive E. coli, Proteus.  She completed antibiotic course.   COVID infection, incidental, no indication for treatment; possibly non-acute testing:  Respiratory status is stable.  Chest x-ray Showed retrocardiac opacity, not sure if there is pneumonia or lung cancer.  Since she was asymptomatic, not started on any treatment but was given Paxlovid .   Paroxysmal A-fib: Not on anticoagulation due to history of hematuria.  Currently in normal sinus rhythm.     History of coronary artery disease: Not on antiplatelet therapy.  No anginal symptoms   History of combined systolic/diastolic congestive heart failure: Currently euvolemic.  Last echo on 03/2020 showed EF of 25 to 10%, grade 1 diastolic dysfunction.   History of dementia: On Aricept   Severe protein calorie malnutrition: We had consulted nutritionist .  Continue oral supplements   Chronic diarrhea: Chronic.GI pathogen panel, C. difficile negative. 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 She was on Tarceva but currently on hold because of side affects.  Oncology has recommended palliative care/hospice referral. Was following with Dr. Julien Nordmann. Palliative care was consulted here, recommended outpatient follow-up   Pressure Injury 09/17/19 Heel Deep Tissue Pressure Injury - Purple or maroon localized area of discolored intact skin or blood-filled blister due to damage of underlying soft tissue from pressure and/or shear. (Active)  09/17/19 0948  Location: Heel  Location Orientation:   Staging: Deep Tissue Pressure Injury - Purple or maroon localized area of discolored intact skin or blood-filled blister due to damage of underlying soft tissue  from pressure and/or shear.  Wound Description (Comments):   Present on Admission: Yes     Pressure Injury 06/03/20 Back Right;Left;Mid;Upper;Lower Stage 2 -   Partial thickness loss of dermis presenting as a shallow open injury with a red, pink wound bed without slough. whole back and buttocks pressure injury.  (Active)  06/03/20 1030  Location: Back  Location Orientation: Right;Left;Mid;Upper;Lower  Staging: Stage 2 -  Partial thickness loss of dermis presenting as a shallow open injury with a red, pink wound bed without slough.  Wound Description (Comments): whole back and buttocks pressure injury.   Present on Admission: Yes     Pressure Injury 10/02/21 Rectum Mid Stage 2 -  Partial thickness loss of dermis presenting as a shallow open injury with a red, pink wound bed without slough. (Active)  10/02/21 2000  Location: Rectum  Location Orientation: Mid  Staging: Stage 2 -  Partial thickness loss of dermis presenting as a shallow open injury with a red, pink wound bed without slough.  Wound Description (Comments):   Present on Admission: Yes  Wound care consulted   Nutrition Problem: Increased nutrient needs Etiology: catabolic illness (XTGGY-69, metastatic cancer)    DVT prophylaxis:enoxaparin (LOVENOX) injection 40 mg Start: 10/02/21 1000     Code Status: DNR  Family Communication: Daughter on phone on 2/15  Patient status: Inpatient  Patient is from : ALF  Anticipated discharge to: ALF versus skilled nursing facility   Estimated DC date: whenever possible   Consultants: Palliative care  Procedures: None  Antimicrobials:  Anti-infectives (From admission, onward)    Start     Dose/Rate Route Frequency Ordered Stop   10/04/21 0000  cephALEXin (KEFLEX) 500 MG capsule  Status:  Discontinued        500 mg Oral 4 times daily 10/04/21 1307 10/06/21    10/04/21 0000  nirmatrelvir/ritonavir EUA, renal dosing, (PAXLOVID) 10 x 150 MG & 10 x 100MG  TABS  Status:  Discontinued        2 tablet Oral 2 times daily 10/04/21 1325 10/06/21    10/01/21 2345  nirmatrelvir/ritonavir EUA (PAXLOVID) 3 tablet  Status:  Discontinued        3  tablet Oral 2 times daily 10/01/21 2331 10/01/21 2342   10/01/21 2345  cefTRIAXone (ROCEPHIN) 1 g in sodium chloride 0.9 % 100 mL IVPB  Status:  Discontinued        1 g 200 mL/hr over 30 Minutes Intravenous Every 24 hours 10/01/21 2334 10/06/21 1135   10/01/21 2345  nirmatrelvir/ritonavir EUA (renal dosing) (PAXLOVID) 2 tablet        2 tablet Oral 2 times daily 10/01/21 2342 10/06/21 1633       Subjective: Patient seen and examined at the bedside this morning.  Hemodynamically stable.  Comfortable.  Eating her breakfast.  Denies abdominal pain, nausea or vomiting.  Has been having intermittent diarrhea but this is chronic.  Objective: Vitals:   10/06/21 2137 10/07/21 0036 10/07/21 0525 10/08/21 0040  BP: 121/62 (!) 99/54 118/63 (!) 113/49  Pulse: 78 78 90 75  Resp: 18 17 17    Temp: (!) 97.1 F (36.2 C) 98.2 F (36.8 C) 97.8 F (36.6 C) 98.1 F (36.7 C)  TempSrc: Oral Oral Oral Oral  SpO2: 97% 96% 96% 99%  Weight:      Height:        Intake/Output Summary (Last 24 hours) at 10/08/2021 4854 Last data filed at 10/08/2021 0600 Gross per 24 hour  Intake --  Output 450 ml  Net -450 ml    Filed Weights   10/01/21 1809  Weight: 74.6 kg    Examination:   General exam: Very deconditioned, chronically looking, weak, loudest HEENT: PERRL Respiratory system:  no wheezes or crackles  Cardiovascular system: S1 & S2 heard, RRR.  Gastrointestinal system: Abdomen is nondistended, soft and nontender. Central nervous system: Alert and oriented Extremities: No edema, no clubbing ,no cyanosis Skin: No rashes,no icterus, pressure ulcers as above  Data Reviewed: I have personally reviewed following labs and imaging studies  CBC: Recent Labs  Lab 10/01/21 1811 10/02/21 0430 10/07/21 0154  WBC 8.7 8.0 8.3  NEUTROABS 7.3  --  6.1  HGB 11.9*   13.3 11.7* 10.6*  HCT 39.7   39.0 37.0 34.3*  MCV 92.3 90.0 90.0  PLT 239 236 496    Basic Metabolic Panel: Recent Labs  Lab  10/01/21 1811 10/02/21 0430 10/07/21 0154  NA 136   138 137 136  K 3.7   3.9 3.5 3.7  CL 105   106 106 104  CO2 22 21* 23  GLUCOSE 115*   112* 114* 100*  BUN 14   18 16 19   CREATININE 1.00   0.90 0.97 0.87  CALCIUM 8.9 8.9 8.9      Recent Results (from the past 240 hour(s))  Resp Panel by RT-PCR (Flu A&B, Covid) Nasopharyngeal Swab     Status: Abnormal   Collection Time: 10/01/21  6:45 PM   Specimen: Nasopharyngeal Swab; Nasopharyngeal(NP) swabs in vial transport medium  Result Value Ref Range Status   SARS Coronavirus 2 by RT PCR POSITIVE (A) NEGATIVE Final    Comment: (NOTE) SARS-CoV-2 target nucleic acids are DETECTED.  The SARS-CoV-2 RNA is generally detectable in upper respiratory specimens during the acute phase of infection. Positive results are indicative of the presence of the identified virus, but do not rule out bacterial infection or co-infection with other pathogens not detected by the test. Clinical correlation with patient history and other diagnostic information is necessary to determine patient infection status. The expected result is Negative.  Fact Sheet for Patients: EntrepreneurPulse.com.au  Fact Sheet for Healthcare Providers: IncredibleEmployment.be  This test is not yet approved or cleared by the [place] FDA and  has been authorized for detection and/or diagnosis of SARS-CoV-2 by FDA under an Emergency Use Authorization (EUA).  This EUA will remain in effect (meaning this test can be used) for the duration of  the COVID-19 declaration under Section 564(b)(1) of the A ct, 21 U.S.C. section 360bbb-3(b)(1), unless the authorization is terminated or revoked sooner.     Influenza A by PCR NEGATIVE NEGATIVE Final   Influenza B by PCR NEGATIVE NEGATIVE Final    Comment: (NOTE) The Xpert Xpress SARS-CoV-2/FLU/RSV plus assay is intended as an aid in the diagnosis of influenza from Nasopharyngeal swab specimens  and should not be used as a sole basis for treatment. Nasal washings and aspirates are unacceptable for Xpert Xpress SARS-CoV-2/FLU/RSV testing.  Fact Sheet for Patients: EntrepreneurPulse.com.au  Fact Sheet for Healthcare Providers: IncredibleEmployment.be  This test is not yet approved or cleared by the [place] FDA and has been authorized for detection and/or diagnosis of SARS-CoV-2 by FDA under an Emergency Use Authorization (EUA). This EUA will remain in effect (meaning this test can be used) for the duration of the COVID-19 declaration under Section 564(b)(1) of the Act, 21 U.S.C. section 360bbb-3(b)(1), unless the authorization is terminated or revoked.  Performed at [facility], [place] [address] [place], [place] [address]   Urine Culture     Status: Abnormal   Collection Time: 10/01/21  9:11 PM   Specimen: Urine, Clean Catch  Result Value Ref Range Status   Specimen Description URINE, CLEAN CATCH  Final   Special Requests   Final    added 2111 Performed at [facility], [place] [address] [place], [place] [address]    Culture (A)  Final    >=100,000 COLONIES/mL ESCHERICHIA COLI 70,000 COLONIES/mL PROTEUS MIRABILIS    Report Status 10/04/2021 FINAL  Final   Organism ID, Bacteria ESCHERICHIA COLI (A)  Final   Organism ID, Bacteria PROTEUS MIRABILIS (A)  Final      Susceptibility   Escherichia coli - MIC*    AMPICILLIN 8 SENSITIVE Sensitive     CEFAZOLIN <=4 SENSITIVE Sensitive     CEFEPIME <=0.12 SENSITIVE Sensitive     CEFTRIAXONE <=0.25 SENSITIVE Sensitive     CIPROFLOXACIN >=4 RESISTANT Resistant     GENTAMICIN >=16 RESISTANT Resistant     IMIPENEM <=0.25 SENSITIVE Sensitive     NITROFURANTOIN <=16 SENSITIVE Sensitive     TRIMETH/SULFA <=20 SENSITIVE Sensitive     AMPICILLIN/SULBACTAM <=2 SENSITIVE Sensitive     PIP/TAZO <=4 SENSITIVE Sensitive     * >=100,000 COLONIES/mL ESCHERICHIA COLI   Proteus mirabilis  - MIC*    AMPICILLIN <=2 SENSITIVE Sensitive     CEFAZOLIN <=4 SENSITIVE Sensitive     CEFEPIME <=0.12 SENSITIVE Sensitive     CEFTRIAXONE <=0.25 SENSITIVE Sensitive     CIPROFLOXACIN <=0.25 SENSITIVE Sensitive     GENTAMICIN <=1 SENSITIVE Sensitive     IMIPENEM 2 SENSITIVE Sensitive     NITROFURANTOIN 128 RESISTANT Resistant     TRIMETH/SULFA <=20 SENSITIVE Sensitive     AMPICILLIN/SULBACTAM <=2 SENSITIVE Sensitive     PIP/TAZO <=4 SENSITIVE Sensitive     * 70,000 COLONIES/mL PROTEUS MIRABILIS  C Difficile Quick Screen w PCR reflex     Status: None   Collection Time: 10/03/21  9:32 PM   Specimen: Stool  Result Value Ref Range Status   C Diff antigen NEGATIVE NEGATIVE Final   C Diff toxin NEGATIVE NEGATIVE Final   C Diff interpretation No C. difficile detected.  Final    Comment: Performed at [facility], [place] [address] [place], [place] [address]  Gastrointestinal Panel by PCR , Stool     Status: None   Collection Time: 10/03/21  9:32 PM   Specimen: Stool  Result Value Ref Range Status   Campylobacter species NOT DETECTED NOT DETECTED Final   Plesimonas shigelloides NOT DETECTED NOT DETECTED Final   Salmonella species NOT DETECTED NOT DETECTED Final   Yersinia enterocolitica NOT DETECTED NOT DETECTED Final   Vibrio species NOT DETECTED NOT DETECTED Final   Vibrio cholerae NOT DETECTED NOT DETECTED Final   Enteroaggregative E coli (EAEC) NOT DETECTED NOT DETECTED Final   Enteropathogenic E coli (EPEC) NOT DETECTED NOT DETECTED Final   Enterotoxigenic E coli (ETEC) NOT DETECTED NOT DETECTED Final   Shiga like toxin producing E coli (STEC) NOT DETECTED NOT DETECTED Final   Shigella/Enteroinvasive E coli (EIEC) NOT DETECTED NOT DETECTED Final   Cryptosporidium NOT DETECTED NOT DETECTED Final   Cyclospora cayetanensis NOT DETECTED NOT DETECTED Final   Entamoeba histolytica NOT DETECTED NOT DETECTED Final   Giardia lamblia NOT DETECTED NOT DETECTED Final   Adenovirus F40/41  NOT DETECTED NOT DETECTED Final   Astrovirus NOT DETECTED  NOT DETECTED Final   Norovirus GI/GII NOT DETECTED NOT DETECTED Final   Rotavirus A NOT DETECTED NOT DETECTED Final   Sapovirus (I, II, IV, and V) NOT DETECTED NOT DETECTED Final    Comment: Performed at [facility], [address], [place], [place] [address]      Radiology Studies: No results found.  Scheduled Meds:  donepezil  10 mg Oral QHS   enoxaparin (LOVENOX) injection  40 mg Subcutaneous Q24H   feeding supplement  237 mL Oral BID BM   Gerhardt's butt cream   Topical BID   saccharomyces boulardii  250 mg Oral BID   Continuous Infusions:     LOS: 5 days   Little Ishikawa, DO [facility] P2/19/2023, 7:38 AM

## 2021-10-09 NOTE — Discharge Summary (Addendum)
Physician Discharge Summary  Valerie [name] WHQ:[id number] DOB: June 30, 1946 DOA: 10/01/2021  PCP: Roetta Sessions, NP  Admit date: 10/01/2021 Discharge date: 10/10/2021  Admitted From: ALF Disposition:  ALF  Recommendations for Outpatient Follow-up:  Follow up with PCP in 1-2 weeks Please obtain BMP/CBC in one week Please follow up on the following pending results:  Discharge Condition:Stable  CODE STATUS:DNR  Diet recommendation: Low-salt low-fat diet as tolerated  Brief/Interim Summary: Patient is a 76 year old female with history of metastatic non-small cell lung cancer currently on not on any treatment, paroxysmal A-fib not on anticoagulation, combined systolic/diastolic congestive heart failure, history of DVT who presents from ALF with concern of left facial droop, dysphagia.  She was found to be confused with slurred speech when family went to visit her at assisted living facility.  She looked more weaker and  was brought to the emergency department.  Presented as a code stroke.  CT head, MRI of the brain did not show any acute intracranial normalities so a stroke has been ruled out.  UA was suggestive of UTI, also found to be COVID-positive.  She is asymptomatic with COVID, not requiring oxygen.  Urine culture showed E. coli, Proteus, both pansensitive.  Palliative care was following during this hospitalization and recommend outpatient follow-up.  PT recommended discharge back to ALF.     Patient remains medically stable for discharge back to ALF but they are declining admission -recommending SNF admission. PT reconsulted. TOC following.  Placement pending SNF acceptance.  Discharge Diagnoses:  Principal Problem:   Acute metabolic encephalopathy Active Problems:   Dementia ([place])   Congestive heart failure (CHF) ([facility])   DNR (do not resuscitate)   PAF (paroxysmal atrial fibrillation) ([place])   Dysarthria   UTI (urinary tract infection)   COVID-19 virus  infection   Altered mental status/acute metabolic encephalopathy, multifactorial, resolved:  Likely secondary to COVID and concurrent UTI.   Presented with concern for left facial droop, dysarthria, confusion.   Brain imaging is negative for acute findings.  Stroke has been ruled out.  Currently she is alert and oriented   Acute urinary tract infection, does not meet sepsis criteria:  History of chronic recurrent UTI.  UA was suggestive of UTI.   Urine culture showed pansensitive E. coli, Proteus. She completed antibiotic course.   COVID infection, incidental, no indication for treatment; possibly non-acute testing:  Respiratory status is stable.  Chest x-ray Showed retrocardiac opacity, not sure if there is pneumonia or lung cancer.  Since she was asymptomatic, not started on any treatment but was given Paxlovid .   Paroxysmal A-fib:  Not on anticoagulation due to history of hematuria.  Currently in normal sinus rhythm.     History of coronary artery disease: Not on antiplatelet therapy.  No anginal symptoms   History of combined systolic/diastolic congestive heart failure: Currently euvolemic. Last echo on 03/2020 showed EF of 25 to 65%, grade 1 diastolic dysfunction.   History of dementia: On Aricept   Severe protein calorie malnutrition: Nutritionist following.  Continue oral supplements   Chronic diarrhea: Chronic.GI pathogen panel, C. difficile negative. 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 She was on Tarceva but currently on hold because of side affects.  Oncology has recommended palliative care/hospice referral. Was following with Dr. Julien Nordmann. Palliative care was consulted here, recommended outpatient follow-up  Pressure Injury 10/02/21 Rectum Mid Stage 2 -  Partial thickness loss of  dermis presenting as a shallow open injury with a red, pink wound bed without slough. (Active)  10/02/21 2000  Location: Rectum  Location Orientation: Mid  Staging: Stage 2 -  Partial thickness loss of dermis presenting as a shallow open injury with a red, pink wound bed without slough.  Wound Description (Comments):   Present on Admission: Yes         Pressure Injury 09/17/19 Heel Deep Tissue Pressure Injury    Discharge Instructions  Discharge Instructions     Amb Referral to Palliative Care   Complete by: As directed    Diet - low sodium heart healthy   Complete by: As directed    Discharge instructions   Complete by: As directed    1)Please take prescribed medications as instructed 2)Follow up with your PCP in a week 3)Follow up with outpatient palliative care   Increase activity slowly   Complete by: As directed    No wound care   Complete by: As directed       Allergies as of 10/10/2021       Reactions   Broccoli [brassica Oleracea] Other (See Comments)   unknown   Fruit Extracts Other (See Comments)   unknown   Sulfa Antibiotics Other (See Comments)   She cant remember  what happens        Medication List     STOP taking these medications    cephALEXin 500 MG capsule Commonly known as: KEFLEX   LOPERAMIDE HCL PO Replaced by: loperamide 2 MG capsule       TAKE these medications    Acetaminophen Extra Strength 500 MG tablet Generic drug: acetaminophen Take 1,000 mg by mouth every 8 (eight) hours as needed for moderate pain.   Desitin 13 % Crea Generic drug: Zinc Oxide Apply 1 application topically 2 (two) times daily. What changed:  how to take this additional instructions   donepezil 10 MG tablet Commonly known as: ARICEPT TAKE 1 TABLET BY MOUTH ONCE DAILY IN THE EVENING   EMOLLIENT EX Apply 1 application topically daily as needed (skin health).   erlotinib 100 MG tablet Commonly known as: TARCEVA Take 100 mg by mouth daily. Take  on an empty stomach 1 hour before meals or 2 hours after   feeding supplement Liqd Take 237 mLs by mouth 2 (two) times daily between meals.   loperamide 2 MG capsule Commonly known as: IMODIUM Take 2 capsules (4 mg total) by mouth every 8 (eight) hours as needed (diarrhea). Replaces: LOPERAMIDE HCL PO   Magnesium 200 MG Tabs Take 200 mg by mouth daily.   Multi-Vitamins Tabs Take 1 tablet by mouth every evening.   NON FORMULARY Apply 1 application topically every 4 (four) hours as needed (for rash to buttocks).   ondansetron 4 MG tablet Commonly known as: Zofran Take 1 tablet (4 mg total) by mouth every 8 (eight) hours as needed for nausea or vomiting.   prochlorperazine 10 MG tablet Commonly known as: COMPAZINE Take 1 tablet (10 mg total) by mouth every 6 (six) hours as needed for nausea or vomiting.   saccharomyces boulardii 250 MG capsule Commonly known as: FLORASTOR Take 1 capsule (250 mg total) by mouth 2 (two) times daily.   vitamin B-12 1000 MCG tablet Commonly known as: CYANOCOBALAMIN Take 1,000 mcg by mouth every evening.   vitamin C 100 MG tablet Take 100 mg by mouth every evening.  Gummie        Follow-up Information     Roetta Sessions, NP. Schedule an appointment as soon as possible for a visit in 1 week(s).   Specialty: Nurse Practitioner Contact information: [address] [place] [address] [phone]         Buford Dresser, MD .   Specialty: Cardiology Contact information: [address] [place] [address] [phone]                Allergies  Allergen Reactions   Broccoli [Brassica Oleracea] Other (See Comments)    unknown   Fruit Extracts Other (See Comments)    unknown   Sulfa Antibiotics Other (See Comments)    She cant remember  what happens   Procedures/Studies: CT Chest W Contrast  Result Date: 09/13/2021 CLINICAL DATA:  Non-small cell lung cancer staging, status post  left lower lobectomy, radiation therapy complete, ongoing chemotherapy EXAM: CT CHEST, ABDOMEN, AND PELVIS WITH CONTRAST TECHNIQUE: Multidetector CT imaging of the chest, abdomen and pelvis was performed following the standard protocol during bolus administration of intravenous contrast. RADIATION DOSE REDUCTION: This exam was performed according to the departmental dose-optimization program which includes automated exposure control, adjustment of the mA and/or kV according to patient size and/or use of iterative reconstruction technique. CONTRAST:  129mL OMNIPAQUE IOHEXOL 300 MG/ML SOLN, additional oral enteric contrast COMPARISON:  CT abdomen pelvis, 04/17/2021, CT chest abdomen pelvis, 02/23/2021 FINDINGS: CT CHEST FINDINGS Cardiovascular: Aortic atherosclerosis. Normal heart size. Three-vessel coronary artery calcifications. No pericardial effusion. Mediastinum/Nodes: No enlarged mediastinal, hilar, or axillary lymph nodes. Thyroid gland, trachea, and esophagus demonstrate no significant findings. Lungs/Pleura: Redemonstrated postoperative findings status post left lower lobectomy with perihilar and paramedian radiation fibrosis and consolidation of the left lung, as well as peribronchovascular consolidation and fibrosis of the central perihilar right upper lobe (series 7, image 59). Small, loculated left pleural effusion. Stable, benign 0.4 cm nodule of the peripheral right upper lobe (series 7, image 56). Musculoskeletal: No chest wall mass or suspicious osseous lesions identified. CT ABDOMEN PELVIS FINDINGS Hepatobiliary: No solid liver abnormality is seen. No gallstones, gallbladder wall thickening, or biliary dilatation. Pancreas: Unremarkable. No pancreatic ductal dilatation or surrounding inflammatory changes. Spleen: Normal in size without significant abnormality. Adrenals/Urinary Tract: Adrenal glands are unremarkable. 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 The right kidney is normal, without renal calculi, solid lesion, or hydronephrosis. Bladder is unremarkable. Stomach/Bowel: Stomach is within normal limits. Appendix appears normal. No evidence of bowel wall thickening, distention, or inflammatory changes. Vascular/Lymphatic: Aortic atherosclerosis. No enlarged abdominal or pelvic lymph nodes. Reproductive: Uterine fibroids. Other: No abdominal wall hernia or abnormality. No ascites. Musculoskeletal: No acute osseous findings. Unchanged, chronic superior endplate and wedge deformities of the T10, L1, L4, and L5 vertebral bodies (series 6, image 99). IMPRESSION: 1. Redemonstrated postoperative findings status post left lower lobectomy with perihilar and paramedian consolidation and radiation fibrosis of the left lung, as well as peribronchovascular consolidation and radiation fibrosis of the central perihilar right upper lobe. No evidence of recurrent malignancy in the chest. The presence of residual or recurrent metabolically active malignancy within areas of fibrosis may be more sensitively assessed by PET-CT if desired. 2. Small, loculated left pleural effusion. 3. No evidence of metastatic disease in the abdomen or pelvis. 4. 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 Previously seen right-sided hydronephrosis is resolved. 5. Unchanged thoracic and lumbar wedge and superior endplate deformities. 6. Coronary artery disease. Aortic Atherosclerosis (ICD10-I70.0). Electronically Signed   By: Delanna Ahmadi M.D.   On: 09/13/2021 13:16   MR BRAIN WO CONTRAST  Result Date: 10/01/2021 CLINICAL DATA:  Initial evaluation for acute TIA. EXAM: MRI HEAD WITHOUT CONTRAST TECHNIQUE: Multiplanar, multiecho  pulse sequences of the brain and surrounding structures were obtained without intravenous contrast. COMPARISON:  Prior CTs from earlier the same day. FINDINGS: Brain: Diffuse prominence of the CSF containing spaces compatible generalized cerebral atrophy. Patchy and confluent T2/FLAIR hyperintensity involving the periventricular deep white matter both cerebral hemispheres as well as the pons, compatible with chronic microvascular ischemic disease, moderately advanced in nature. Few associated remote lacunar infarcts present about the hemispheric cerebral white matter. Remote bilateral parieto-occipital infarcts with associated laminar necrosis noted, better seen on prior CT. Small remote right cerebellar infarct noted. No evidence for acute or subacute ischemia. No acute intracranial hemorrhage. Few scattered chronic micro hemorrhages noted involving the supratentorial the vertebral brain, likely hypertensive in nature. No mass lesion, midline shift or mass effect. No hydrocephalus or extra-axial fluid collection. Pituitary gland suprasellar region normal. Midline structures intact. Vascular: Major intracranial vascular flow voids are maintained. Skull and upper cervical spine: Craniocervical junction within normal limits. Sequelae of prior left occipital craniotomy. No scalp soft tissue abnormality. Sinuses/Orbits: Globes and orbital soft tissues within normal limits. Mild scattered mucosal thickening noted within the ethmoidal air cells. Paranasal sinuses are otherwise clear. Trace right mastoid effusion, of doubtful significance. Other: None. IMPRESSION: 1. No acute intracranial abnormality. 2. 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 3. Moderately advanced chronic microvascular ischemic disease, with a few remote lacunar infarcts about the hemispheric cerebral white matter and cerebellum. Electronically Signed   By: Jeannine Boga M.D.   On:  10/01/2021 20:25   CT ABDOMEN PELVIS W CONTRAST  Result Date: 09/20/2021 CLINICAL DATA:  Dysuria, dementia, worsening sacral decubitus ulcers EXAM: CT ABDOMEN AND PELVIS WITH CONTRAST TECHNIQUE: Multidetector CT imaging of the abdomen and pelvis was performed using the standard protocol following bolus administration of intravenous contrast. RADIATION DOSE REDUCTION: This exam was performed according to the departmental dose-optimization program which includes automated exposure control, adjustment of the mA and/or kV according to patient size and/or use of iterative reconstruction technique. CONTRAST:  49mL OMNIPAQUE IOHEXOL 300 MG/ML  SOLN COMPARISON:  09/12/2021 FINDINGS: Lower chest: Stable left pleural effusion. Chronic elevation of the left hemidiaphragm. Hepatobiliary: No focal liver abnormality is seen. No evidence of cholelithiasis or gallbladder wall thickening. Bile duct normal for age. Pancreas: No acute inflammatory changes. Stable chronic pancreatic duct dilation. Spleen: Normal in size without focal abnormality. Adrenals/Urinary Tract: There is a 7 mm nonobstructing calculus within the right renal pelvis. There are 2 other punctate less than 3 mm nonobstructing right renal calculi as well. The right kidney enhances normally. 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 There is stable left hydroureter, with mild diffuse enhancement of the left ureteral mucosa. This could reflect underlying infection. Please correlate with urinalysis. No urinary tract calculi. Bladder is decompressed. 5 mm nonobstructing calculus layers dependently within the right-side of the bladder lumen. Stomach/Bowel: No bowel obstruction or ileus. Normal appendix right lower quadrant. No bowel wall thickening or inflammatory change. Vascular/Lymphatic: Aortic atherosclerosis. No enlarged abdominal or pelvic lymph nodes. Reproductive: Uterus and  bilateral adnexa are unremarkable. Other: No free fluid or free  intraperitoneal gas. No abdominal wall hernia. Musculoskeletal: No acute or destructive bony lesions. Reconstructed images demonstrate stable chronic compression deformities at T10, L1, L4, and L5. IMPRESSION: 1. Nonobstructing right renal and bladder calculi as above. 2. Chronic left hydroureter, with diffuse left ureteral mucosal enhancement. Findings could reflect underlying infection. Please correlate with urinalysis. 3. Stable left pleural effusion. 4.  Aortic Atherosclerosis (ICD10-I70.0). Electronically Signed   By: Randa Ngo M.D.   On: 09/20/2021 18:32   CT Abdomen Pelvis W Contrast  Result Date: 09/13/2021 CLINICAL DATA:  Non-small cell lung cancer staging, status post left lower lobectomy, radiation therapy complete, ongoing chemotherapy EXAM: CT CHEST, ABDOMEN, AND PELVIS WITH CONTRAST TECHNIQUE: Multidetector CT imaging of the chest, abdomen and pelvis was performed following the standard protocol during bolus administration of intravenous contrast. RADIATION DOSE REDUCTION: This exam was performed according to the departmental dose-optimization program which includes automated exposure control, adjustment of the mA and/or kV according to patient size and/or use of iterative reconstruction technique. CONTRAST:  187mL OMNIPAQUE IOHEXOL 300 MG/ML SOLN, additional oral enteric contrast COMPARISON:  CT abdomen pelvis, 04/17/2021, CT chest abdomen pelvis, 02/23/2021 FINDINGS: CT CHEST FINDINGS Cardiovascular: Aortic atherosclerosis. Normal heart size. Three-vessel coronary artery calcifications. No pericardial effusion. Mediastinum/Nodes: No enlarged mediastinal, hilar, or axillary lymph nodes. Thyroid gland, trachea, and esophagus demonstrate no significant findings. Lungs/Pleura: Redemonstrated postoperative findings status post left lower lobectomy with perihilar and paramedian radiation fibrosis and consolidation of the left lung, as well as peribronchovascular consolidation and fibrosis of the  central perihilar right upper lobe (series 7, image 59). Small, loculated left pleural effusion. Stable, benign 0.4 cm nodule of the peripheral right upper lobe (series 7, image 56). Musculoskeletal: No chest wall mass or suspicious osseous lesions identified. CT ABDOMEN PELVIS FINDINGS Hepatobiliary: No solid liver abnormality is seen. No gallstones, gallbladder wall thickening, or biliary dilatation. Pancreas: Unremarkable. No pancreatic ductal dilatation or surrounding inflammatory changes. Spleen: Normal in size without significant abnormality. Adrenals/Urinary Tract: Adrenal glands are unremarkable. 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 The right kidney is normal, without renal calculi, solid lesion, or hydronephrosis. Bladder is unremarkable. Stomach/Bowel: Stomach is within normal limits. Appendix appears normal. No evidence of bowel wall thickening, distention, or inflammatory changes. Vascular/Lymphatic: Aortic atherosclerosis. No enlarged abdominal or pelvic lymph nodes. Reproductive: Uterine fibroids. Other: No abdominal wall hernia or abnormality. No ascites. Musculoskeletal: No acute osseous findings. Unchanged, chronic superior endplate and wedge deformities of the T10, L1, L4, and L5 vertebral bodies (series 6, image 99). IMPRESSION: 1. Redemonstrated postoperative findings status post left lower lobectomy with perihilar and paramedian consolidation and radiation fibrosis of the left lung, as well as peribronchovascular consolidation and radiation fibrosis of the central perihilar right upper lobe. No evidence of recurrent malignancy in the chest. The presence of residual or recurrent metabolically active malignancy within areas of fibrosis may be more sensitively assessed by PET-CT if desired. 2. Small, loculated left pleural effusion. 3. No  evidence of metastatic disease in the abdomen or pelvis. 4. 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 Previously seen right-sided hydronephrosis is resolved. 5. Unchanged thoracic and lumbar wedge and superior endplate deformities. 6. Coronary artery disease. Aortic Atherosclerosis (ICD10-I70.0). Electronically Signed   By: Lanae Crumbly  Laqueta Carina M.D.   On: 09/13/2021 13:16   DG Chest Portable 1 View  Result Date: 10/01/2021 CLINICAL DATA:  concern for pna EXAM: PORTABLE CHEST 1 VIEW COMPARISON:  Chest x-ray 08/22/2021, CT chest 09/12/2021 FINDINGS: The heart and mediastinal contours are within normal limits. Bilateral hilar prominence consistent with known peribronchovascular consolidation and fibrosis best visualized on CT chest 09/12/2021. Aortic calcification. Retrocardiac airspace opacity. No pulmonary edema. Persistent at least trace volume left pleural effusion. No pneumothorax. No acute osseous abnormality.  Old healed left rib fractures. IMPRESSION: 1. Retrocardiac airspace opacity and persistent at least trace volume left pleural effusion. Followup PA and lateral chest X-ray is recommended in 3-4 weeks following therapy to ensure resolution. 2.  Aortic Atherosclerosis (ICD10-I70.0). Electronically Signed   By: Iven Finn M.D.   On: 10/01/2021 19:06   CT HEAD CODE STROKE WO CONTRAST  Result Date: 10/01/2021 CLINICAL DATA:  Code stroke. Neuro deficit, acute, stroke suspected. EXAM: CT HEAD WITHOUT CONTRAST TECHNIQUE: Contiguous axial images were obtained from the base of the skull through the vertex without intravenous contrast. RADIATION DOSE REDUCTION: This exam was performed according to the departmental dose-optimization program which includes automated exposure control, adjustment of the mA and/or kV according to patient size and/or use of  iterative reconstruction technique. COMPARISON:  03/22/2020 FINDINGS: Brain: No acute finding or change since the prior exam. There is cerebellar atrophy without focal insult. Patient has had previous left parieto-occipital craniotomy. There is volume loss affecting the cortex in both posterior parietal/occipital regions, more extensive on the left than the right, with laminar necrosis and cortical calcification. There chronic small-vessel ischemic changes elsewhere affecting the cerebral hemispheric white matter. Vascular: There is atherosclerotic calcification of the major vessels at the base of the brain. Skull: Otherwise negative Sinuses/Orbits: Clear/normal Other: None ASPECTS ([place] Stroke Program Early CT Score) - Ganglionic level infarction (caudate, lentiform nuclei, internal capsule, insula, M1-M3 cortex): 7 - Supraganglionic infarction (M4-M6 cortex): 3 Total score (0-10 with 10 being normal): 10 IMPRESSION: 1. No acute CT finding. Distant left posterior parietal craniotomy. Old bilateral parieto-occipital cortical infarctions with laminar necrosis and calcification. 2. ASPECTS is 10 3. These results were communicated to Dr. Leonel Ramsay at 6:19 pm on 10/01/2021 by text page via the [facility] messaging system. Electronically Signed   By: Nelson Chimes M.D.   On: 10/01/2021 18:20   CT ANGIO HEAD NECK W WO CM (CODE STROKE)  Result Date: 10/01/2021 CLINICAL DATA:  Stroke. EXAM: CT ANGIOGRAPHY HEAD AND NECK TECHNIQUE: Multidetector CT imaging of the head and neck was performed using the standard protocol during bolus administration of intravenous contrast. Multiplanar CT image reconstructions and MIPs were obtained to evaluate the vascular anatomy. Carotid stenosis measurements (when applicable) are obtained utilizing NASCET criteria, using the distal internal carotid diameter as the denominator. RADIATION DOSE REDUCTION: This exam was performed according to the departmental dose-optimization program which  includes automated exposure control, adjustment of the mA and/or kV according to patient size and/or use of iterative reconstruction technique. CONTRAST:  35mL OMNIPAQUE IOHEXOL 350 MG/ML SOLN COMPARISON:  Soft tissue neck CT 12/29/2019.  Chest CT 09/12/2021. FINDINGS: CTA NECK FINDINGS Aortic arch: Standard 3 vessel aortic arch with mild atherosclerotic plaque. No significant arch vessel origin stenosis. Right carotid system: Patent with a small amount of calcified plaque at the carotid bifurcation. No evidence of a significant stenosis or dissection. Left carotid system: Patent with minimal calcified plaque at the carotid bifurcation. No evidence of a significant stenosis or dissection. Vertebral arteries:  Patent and codominant without evidence a significant stenosis or dissection. Skeleton: No acute osseous abnormality or suspicious osseous lesion. Other neck: Fatty replacement of the right parotid gland. Status post right hemithyroidectomy. No evidence of cervical lymphadenopathy. Upper chest: Partially visualized small pericardial effusion, left hemithorax volume loss, and small left pleural effusion as shown on prior chest CT. Review of the MIP images confirms the above findings CTA HEAD FINDINGS Anterior circulation: The internal carotid arteries are widely patent from skull base to carotid termini. ACAs and MCAs are patent without evidence of a proximal branch occlusion or significant proximal stenosis. No aneurysm is identified. Posterior circulation: Intracranial vertebral arteries are widely patent to the basilar. The left PICA and right AICA appear dominant. Patent SCA origins are seen bilaterally. The basilar artery is widely patent. 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 Both PCAs are patent without evidence of a significant proximal stenosis. No aneurysm is identified. Venous sinuses: As permitted by contrast timing, patent.  Anatomic variants: Fetal origin of the PCAs. Review of the MIP images confirms the above findings IMPRESSION: 1. No large vessel occlusion or significant stenosis in the head or neck. 2.  Aortic Atherosclerosis (ICD10-I70.0). These results were communicated to Dr. Leonel Ramsay at 6:38 pm on 10/01/2021 by text page via the [facility] messaging system. Electronically Signed   By: Logan Bores M.D.   On: 10/01/2021 18:48     Subjective: No acute issues or events reported overnight   Discharge Exam: Vitals:   10/10/21 0800 10/10/21 1242  BP: 128/64 133/60  Pulse: 79 70  Resp: 18 18  Temp: (!) 97.2 F (36.2 C) (!) 97.5 F (36.4 C)  SpO2:     Vitals:   10/09/21 2352 10/10/21 0400 10/10/21 0800 10/10/21 1242  BP: 138/67 (!) 119/59 128/64 133/60  Pulse: 73 75 79 70  Resp: 17 16 18 18   Temp: 98.4 F (36.9 C) 98 F (36.7 C) (!) 97.2 F (36.2 C) (!) 97.5 F (36.4 C)  TempSrc: Oral Oral Oral Oral  SpO2: 95% 99%    Weight:      Height:        General: Pt is alert, awake, not in acute distress Cardiovascular: RRR, S1/S2 +, no rubs, no gallops Respiratory: CTA bilaterally, no wheezing, no rhonchi Abdominal: Soft, NT, ND, bowel sounds + Extremities: no edema, no cyanosis   The results of significant diagnostics from this hospitalization (including imaging, microbiology, ancillary and laboratory) are listed below for reference.     Microbiology: Recent Results (from the past 240 hour(s))  Resp Panel by RT-PCR (Flu A&B, Covid) Nasopharyngeal Swab     Status: Abnormal   Collection Time: 10/01/21  6:45 PM   Specimen: Nasopharyngeal Swab; Nasopharyngeal(NP) swabs in vial transport medium  Result Value Ref Range Status   SARS Coronavirus 2 by RT PCR POSITIVE (A) NEGATIVE Final    Comment: (NOTE) SARS-CoV-2 target nucleic acids are DETECTED.  The SARS-CoV-2 RNA is generally detectable in upper respiratory specimens during the acute phase of infection. Positive results are indicative of the  presence of the identified virus, but do not rule out bacterial infection or co-infection with other pathogens not detected by the test. Clinical correlation with patient history and other diagnostic information is necessary to determine patient infection status. The expected result is Negative.  Fact Sheet for Patients: EntrepreneurPulse.com.au  Fact Sheet for Healthcare Providers: IncredibleEmployment.be  This test is not yet approved or cleared by the [place]  States FDA and  has been authorized for detection and/or diagnosis of SARS-CoV-2 by FDA under an Emergency Use Authorization (EUA).  This EUA will remain in effect (meaning this test can be used) for the duration of  the COVID-19 declaration under Section 564(b)(1) of the A ct, 21 U.S.C. section 360bbb-3(b)(1), unless the authorization is terminated or revoked sooner.     Influenza A by PCR NEGATIVE NEGATIVE Final   Influenza B by PCR NEGATIVE NEGATIVE Final    Comment: (NOTE) The Xpert Xpress SARS-CoV-2/FLU/RSV plus assay is intended as an aid in the diagnosis of influenza from Nasopharyngeal swab specimens and should not be used as a sole basis for treatment. Nasal washings and aspirates are unacceptable for Xpert Xpress SARS-CoV-2/FLU/RSV testing.  Fact Sheet for Patients: EntrepreneurPulse.com.au  Fact Sheet for Healthcare Providers: IncredibleEmployment.be  This test is not yet approved or cleared by the [place] FDA and has been authorized for detection and/or diagnosis of SARS-CoV-2 by FDA under an Emergency Use Authorization (EUA). This EUA will remain in effect (meaning this test can be used) for the duration of the COVID-19 declaration under Section 564(b)(1) of the Act, 21 U.S.C. section 360bbb-3(b)(1), unless the authorization is terminated or revoked.  Performed at [facility], [place] [address] [place], [place] [address]    Urine Culture     Status: Abnormal   Collection Time: 10/01/21  9:11 PM   Specimen: Urine, Clean Catch  Result Value Ref Range Status   Specimen Description URINE, CLEAN CATCH  Final   Special Requests   Final    added 2111 Performed at [facility], [place] [address] [place], [place] [address]    Culture (A)  Final    >=100,000 COLONIES/mL ESCHERICHIA COLI 70,000 COLONIES/mL PROTEUS MIRABILIS    Report Status 10/04/2021 FINAL  Final   Organism ID, Bacteria ESCHERICHIA COLI (A)  Final   Organism ID, Bacteria PROTEUS MIRABILIS (A)  Final      Susceptibility   Escherichia coli - MIC*    AMPICILLIN 8 SENSITIVE Sensitive     CEFAZOLIN <=4 SENSITIVE Sensitive     CEFEPIME <=0.12 SENSITIVE Sensitive     CEFTRIAXONE <=0.25 SENSITIVE Sensitive     CIPROFLOXACIN >=4 RESISTANT Resistant     GENTAMICIN >=16 RESISTANT Resistant     IMIPENEM <=0.25 SENSITIVE Sensitive     NITROFURANTOIN <=16 SENSITIVE Sensitive     TRIMETH/SULFA <=20 SENSITIVE Sensitive     AMPICILLIN/SULBACTAM <=2 SENSITIVE Sensitive     PIP/TAZO <=4 SENSITIVE Sensitive     * >=100,000 COLONIES/mL ESCHERICHIA COLI   Proteus mirabilis - MIC*    AMPICILLIN <=2 SENSITIVE Sensitive     CEFAZOLIN <=4 SENSITIVE Sensitive     CEFEPIME <=0.12 SENSITIVE Sensitive     CEFTRIAXONE <=0.25 SENSITIVE Sensitive     CIPROFLOXACIN <=0.25 SENSITIVE Sensitive     GENTAMICIN <=1 SENSITIVE Sensitive     IMIPENEM 2 SENSITIVE Sensitive     NITROFURANTOIN 128 RESISTANT Resistant     TRIMETH/SULFA <=20 SENSITIVE Sensitive     AMPICILLIN/SULBACTAM <=2 SENSITIVE Sensitive     PIP/TAZO <=4 SENSITIVE Sensitive     * 70,000 COLONIES/mL PROTEUS MIRABILIS  C Difficile Quick Screen w PCR reflex     Status: None   Collection Time: 10/03/21  9:32 PM   Specimen: Stool  Result Value Ref Range Status   C Diff antigen NEGATIVE NEGATIVE Final   C Diff toxin NEGATIVE NEGATIVE Final   C Diff interpretation No C.  difficile detected.  Final     Comment: Performed at [facility], [place] [address], [place], [place] [address]  Gastrointestinal Panel by PCR , Stool     Status: None   Collection Time: 10/03/21  9:32 PM   Specimen: Stool  Result Value Ref Range Status   Campylobacter species NOT DETECTED NOT DETECTED Final   Plesimonas shigelloides NOT DETECTED NOT DETECTED Final   Salmonella species NOT DETECTED NOT DETECTED Final   Yersinia enterocolitica NOT DETECTED NOT DETECTED Final   Vibrio species NOT DETECTED NOT DETECTED Final   Vibrio cholerae NOT DETECTED NOT DETECTED Final   Enteroaggregative E coli (EAEC) NOT DETECTED NOT DETECTED Final   Enteropathogenic E coli (EPEC) NOT DETECTED NOT DETECTED Final   Enterotoxigenic E coli (ETEC) NOT DETECTED NOT DETECTED Final   Shiga like toxin producing E coli (STEC) NOT DETECTED NOT DETECTED Final   Shigella/Enteroinvasive E coli (EIEC) NOT DETECTED NOT DETECTED Final   Cryptosporidium NOT DETECTED NOT DETECTED Final   Cyclospora cayetanensis NOT DETECTED NOT DETECTED Final   Entamoeba histolytica NOT DETECTED NOT DETECTED Final   Giardia lamblia NOT DETECTED NOT DETECTED Final   Adenovirus F40/41 NOT DETECTED NOT DETECTED Final   Astrovirus NOT DETECTED NOT DETECTED Final   Norovirus GI/GII NOT DETECTED NOT DETECTED Final   Rotavirus A NOT DETECTED NOT DETECTED Final   Sapovirus (I, II, IV, and V) NOT DETECTED NOT DETECTED Final    Comment: Performed at [facility], [address] [place], [place] [address]     Labs: BNP (last 3 results) No results for input(s): BNP in the last 8760 hours. Basic Metabolic Panel: Recent Labs  Lab 10/07/21 0154  NA 136  K 3.7  CL 104  CO2 23  GLUCOSE 100*  BUN 19  CREATININE 0.87  CALCIUM 8.9   Liver Function Tests: No results for input(s): AST, ALT, ALKPHOS, BILITOT, PROT, ALBUMIN in the last 168 hours. No results for input(s): LIPASE, AMYLASE in the last 168 hours. No results for input(s): AMMONIA in the last  168 hours. CBC: Recent Labs  Lab 10/07/21 0154  WBC 8.3  NEUTROABS 6.1  HGB 10.6*  HCT 34.3*  MCV 90.0  PLT 252   Cardiac Enzymes: No results for input(s): CKTOTAL, CKMB, CKMBINDEX, TROPONINI in the last 168 hours. BNP: Invalid input(s): POCBNP CBG: Recent Labs  Lab 10/07/21 1652  GLUCAP 99   D-Dimer No results for input(s): DDIMER in the last 72 hours. Hgb A1c No results for input(s): HGBA1C in the last 72 hours. Lipid Profile No results for input(s): CHOL, HDL, LDLCALC, TRIG, CHOLHDL, LDLDIRECT in the last 72 hours. Thyroid function studies No results for input(s): TSH, T4TOTAL, T3FREE, THYROIDAB in the last 72 hours.  Invalid input(s): FREET3 Anemia work up No results for input(s): VITAMINB12, FOLATE, FERRITIN, TIBC, IRON, RETICCTPCT in the last 72 hours. Urinalysis    Component Value Date/Time   COLORURINE AMBER (A) 10/01/2021 1808   APPEARANCEUR TURBID (A) 10/01/2021 1808   LABSPEC 1.027 10/01/2021 1808   PHURINE 7.0 10/01/2021 1808   GLUCOSEU NEGATIVE 10/01/2021 1808   HGBUR MODERATE (A) 10/01/2021 1808   BILIRUBINUR NEGATIVE 10/01/2021 1808   BILIRUBINUR negative 05/25/2019 1653   KETONESUR NEGATIVE 10/01/2021 1808   PROTEINUR 100 (A) 10/01/2021 1808   UROBILINOGEN 0.2 05/25/2019 1653   UROBILINOGEN 0.2 10/20/2014 1636   NITRITE NEGATIVE 10/01/2021 1808   LEUKOCYTESUR LARGE (A) 10/01/2021 1808   Sepsis Labs Invalid input(s): PROCALCITONIN,  WBC,  Bartholomew Microbiology Recent Results (from the past 240 hour(s))  Resp Panel by RT-PCR (Flu A&B, Covid) Nasopharyngeal Swab     Status: Abnormal   Collection Time: 10/01/21  6:45 PM   Specimen: Nasopharyngeal Swab; Nasopharyngeal(NP) swabs in vial transport medium  Result Value Ref Range Status   SARS Coronavirus 2 by RT PCR POSITIVE (A) NEGATIVE Final    Comment: (NOTE) SARS-CoV-2 target nucleic acids are DETECTED.  The SARS-CoV-2 RNA is generally detectable in upper respiratory specimens during the  acute phase of infection. Positive results are indicative of the presence of the identified virus, but do not rule out bacterial infection or co-infection with other pathogens not detected by the test. Clinical correlation with patient history and other diagnostic information is necessary to determine patient infection status. The expected result is Negative.  Fact Sheet for Patients: EntrepreneurPulse.com.au  Fact Sheet for Healthcare Providers: IncredibleEmployment.be  This test is not yet approved or cleared by the [place] FDA and  has been authorized for detection and/or diagnosis of SARS-CoV-2 by FDA under an Emergency Use Authorization (EUA).  This EUA will remain in effect (meaning this test can be used) for the duration of  the COVID-19 declaration under Section 564(b)(1) of the A ct, 21 U.S.C. section 360bbb-3(b)(1), unless the authorization is terminated or revoked sooner.     Influenza A by PCR NEGATIVE NEGATIVE Final   Influenza B by PCR NEGATIVE NEGATIVE Final    Comment: (NOTE) The Xpert Xpress SARS-CoV-2/FLU/RSV plus assay is intended as an aid in the diagnosis of influenza from Nasopharyngeal swab specimens and should not be used as a sole basis for treatment. Nasal washings and aspirates are unacceptable for Xpert Xpress SARS-CoV-2/FLU/RSV testing.  Fact Sheet for Patients: EntrepreneurPulse.com.au  Fact Sheet for Healthcare Providers: IncredibleEmployment.be  This test is not yet approved or cleared by the [place] FDA and has been authorized for detection and/or diagnosis of SARS-CoV-2 by FDA under an Emergency Use Authorization (EUA). This EUA will remain in effect (meaning this test can be used) for the duration of the COVID-19 declaration under Section 564(b)(1) of the Act, 21 U.S.C. section 360bbb-3(b)(1), unless the authorization is terminated or revoked.  Performed at  [facility], [place] [address], [place], [place] [address]   Urine Culture     Status: Abnormal   Collection Time: 10/01/21  9:11 PM   Specimen: Urine, Clean Catch  Result Value Ref Range Status   Specimen Description URINE, CLEAN CATCH  Final   Special Requests   Final    added 2111 Performed at [facility], [place] [address], [place], [place] [address]    Culture (A)  Final    >=100,000 COLONIES/mL ESCHERICHIA COLI 70,000 COLONIES/mL PROTEUS MIRABILIS    Report Status 10/04/2021 FINAL  Final   Organism ID, Bacteria ESCHERICHIA COLI (A)  Final   Organism ID, Bacteria PROTEUS MIRABILIS (A)  Final      Susceptibility   Escherichia coli - MIC*    AMPICILLIN 8 SENSITIVE Sensitive     CEFAZOLIN <=4 SENSITIVE Sensitive     CEFEPIME <=0.12 SENSITIVE Sensitive     CEFTRIAXONE <=0.25 SENSITIVE Sensitive     CIPROFLOXACIN >=4 RESISTANT Resistant     GENTAMICIN >=16 RESISTANT Resistant     IMIPENEM <=0.25 SENSITIVE Sensitive     NITROFURANTOIN <=16 SENSITIVE Sensitive     TRIMETH/SULFA <=20 SENSITIVE Sensitive     AMPICILLIN/SULBACTAM <=2 SENSITIVE Sensitive     PIP/TAZO <=4 SENSITIVE Sensitive     * >=  100,000 COLONIES/mL ESCHERICHIA COLI   Proteus mirabilis - MIC*    AMPICILLIN <=2 SENSITIVE Sensitive     CEFAZOLIN <=4 SENSITIVE Sensitive     CEFEPIME <=0.12 SENSITIVE Sensitive     CEFTRIAXONE <=0.25 SENSITIVE Sensitive     CIPROFLOXACIN <=0.25 SENSITIVE Sensitive     GENTAMICIN <=1 SENSITIVE Sensitive     IMIPENEM 2 SENSITIVE Sensitive     NITROFURANTOIN 128 RESISTANT Resistant     TRIMETH/SULFA <=20 SENSITIVE Sensitive     AMPICILLIN/SULBACTAM <=2 SENSITIVE Sensitive     PIP/TAZO <=4 SENSITIVE Sensitive     * 70,000 COLONIES/mL PROTEUS MIRABILIS  C Difficile Quick Screen w PCR reflex     Status: None   Collection Time: 10/03/21  9:32 PM   Specimen: Stool  Result Value Ref Range Status   C Diff antigen NEGATIVE NEGATIVE Final   C Diff toxin NEGATIVE NEGATIVE Final    C Diff interpretation No C. difficile detected.  Final    Comment: Performed at [facility], [place] [address], [place], [place] [address]  Gastrointestinal Panel by PCR , Stool     Status: None   Collection Time: 10/03/21  9:32 PM   Specimen: Stool  Result Value Ref Range Status   Campylobacter species NOT DETECTED NOT DETECTED Final   Plesimonas shigelloides NOT DETECTED NOT DETECTED Final   Salmonella species NOT DETECTED NOT DETECTED Final   Yersinia enterocolitica NOT DETECTED NOT DETECTED Final   Vibrio species NOT DETECTED NOT DETECTED Final   Vibrio cholerae NOT DETECTED NOT DETECTED Final   Enteroaggregative E coli (EAEC) NOT DETECTED NOT DETECTED Final   Enteropathogenic E coli (EPEC) NOT DETECTED NOT DETECTED Final   Enterotoxigenic E coli (ETEC) NOT DETECTED NOT DETECTED Final   Shiga like toxin producing E coli (STEC) NOT DETECTED NOT DETECTED Final   Shigella/Enteroinvasive E coli (EIEC) NOT DETECTED NOT DETECTED Final   Cryptosporidium NOT DETECTED NOT DETECTED Final   Cyclospora cayetanensis NOT DETECTED NOT DETECTED Final   Entamoeba histolytica NOT DETECTED NOT DETECTED Final   Giardia lamblia NOT DETECTED NOT DETECTED Final   Adenovirus F40/41 NOT DETECTED NOT DETECTED Final   Astrovirus NOT DETECTED NOT DETECTED Final   Norovirus GI/GII NOT DETECTED NOT DETECTED Final   Rotavirus A NOT DETECTED NOT DETECTED Final   Sapovirus (I, II, IV, and V) NOT DETECTED NOT DETECTED Final    Comment: Performed at [facility], [place] [place], [place] [address]     Time coordinating discharge: Over 30 minutes  SIGNED:   Oren Binet, DO [facility] 10/10/2021, 2:16 PM Pager   If 7PM-7AM, please contact night-coverage [email or web address]

## 2021-10-09 NOTE — TOC Progression Note (Addendum)
Transition of Care [facility]) - Progression Note    Patient Details  Name: Valerie [name] MRN: [id number] Date of Birth: 12-19-1945  Transition of Care [facility]) CM/SW [place], [place] Phone Number: 10/09/2021, 10:23 AM  Clinical Narrative:     CSW called [facility] main number and is transferred to [facility]. CSW informed that they are sending someone this AM to assess pt.   1400: Called Shazma at [facility]. She informed CSW that assessment went good and that pt's daughter is visiting to pick an apartment and complete paperwork. Donneta Romberg states that pt should be able to be admitted there tomorrow.   1520: CSW received call from daughter who confirmed pt will DC tomorrow to [facility]. They are requesting late afternoon DC in order to move pt's belongings into facility. Pt will need [facility]AR transport.   Expected Discharge Plan: Skilled Nursing Facility Barriers to Discharge: SNF Pending bed offer, SNF Covid  Expected Discharge Plan and Services Expected Discharge Plan: [place] In-house Referral: Clinical Social Work   Post Acute Care Choice: Resumption of Svcs/PTA Provider Living arrangements for the past 2 months: [place] Expected Discharge Date: 10/04/21                         HH Arranged: RN [place] Agency: [facility] Date [place]: 10/04/21 Time [place]: [phone] Representative spoke with at [place]: [place] ([place]) Interventions    Readmission Risk Interventions Readmission Risk Prevention Plan 02/18/2020  Transportation Screening Complete  PCP or Specialist Appt within 5-7 Days Complete  Home Care Screening Patient refused  Medication Review (RN CM) Complete  Some recent data might be hidden

## 2021-10-09 NOTE — Plan of Care (Signed)
°  Problem: Education: Goal: Knowledge of General Education information will improve Description: Including pain rating scale, medication(s)/side effects and non-pharmacologic comfort measures Outcome: Adequate for Discharge   Problem: Health Behavior/Discharge Planning: Goal: Ability to manage health-related needs will improve Outcome: Adequate for Discharge   Problem: Clinical Measurements: Goal: Ability to maintain clinical measurements within normal limits will improve Outcome: Adequate for Discharge Goal: Will remain free from infection Outcome: Adequate for Discharge Goal: Diagnostic test results will improve Outcome: Adequate for Discharge Goal: Respiratory complications will improve Outcome: Adequate for Discharge Goal: Cardiovascular complication will be avoided Outcome: Adequate for Discharge   Problem: Activity: Goal: Risk for activity intolerance will decrease Outcome: Adequate for Discharge   Problem: Nutrition: Goal: Adequate nutrition will be maintained Outcome: Adequate for Discharge   Problem: Elimination: Goal: Will not experience complications related to bowel motility Outcome: Adequate for Discharge Goal: Will not experience complications related to urinary retention Outcome: Adequate for Discharge   Problem: Pain Managment: Goal: General experience of comfort will improve Outcome: Adequate for Discharge   Problem: Safety: Goal: Ability to remain free from injury will improve Outcome: Adequate for Discharge   Problem: Skin Integrity: Goal: Risk for impaired skin integrity will decrease Outcome: Adequate for Discharge   Problem: Education: Goal: Knowledge of risk factors and measures for prevention of condition will improve Outcome: Adequate for Discharge   Problem: Coping: Goal: Psychosocial and spiritual needs will be supported Outcome: Adequate for Discharge   Problem: Respiratory: Goal: Complications related to the disease process,  condition or treatment will be avoided or minimized Outcome: Adequate for Discharge

## 2021-10-10 NOTE — TOC Transition Note (Signed)
Transition of Care [facility]) - CM/SW Discharge Note   Patient Details  Name: Valerie [name] MRN: [id number] Date of Birth: [birth year]  [facility] CM/SW Contact:  Benard Halsted, LCSW Phone Number: 10/10/2021, 11:56 AM   Clinical Narrative:    Patient will DC to: [facility] Anticipated DC date: 10/10/21 Family notified: [name], Daughter Transport by: Ocie Cornfield 3:30pm   Per MD patient ready for DC to [facility]. RN to call report prior to discharge [phone]). RN, patient, patient's family, and facility notified of DC. Discharge Summary and FL2 sent to facility. DC packet on chart. Ambulance transport requested for patient.   CSW will sign off for now as social work intervention is no longer needed. Please consult [place] again if new needs arise.     Final next level of care: Assisted Living Barriers to Discharge: Barriers Resolved   Patient Goals and CMS Choice Patient states their goals for this hospitalization and ongoing recovery are:: Return to ALF   Choice offered to / list presented to : Adult Children, Patient  Discharge Placement                Patient to be transferred to facility by: [facility] Name of family member notified: [name] Patient and family notified of of transfer: 10/10/21  Discharge Plan and Services In-house Referral: Clinical Social Work   Post Acute Care Choice: Resumption of Svcs/PTA Provider                    HH Arranged: RN [place] Agency: [facility] Date [place]: 10/04/21 Time [place]: 2924 Representative spoke with at [place]: [place] ([place]) Interventions     Readmission Risk Interventions Readmission Risk Prevention Plan 02/18/2020  Transportation Screening Complete  PCP or Specialist Appt within 5-7 Days Complete  Home Care Screening Patient refused  Medication Review (RN CM) Complete  Some recent data might be hidden

## 2021-10-10 NOTE — NC FL2 (Signed)
[place] LEVEL OF CARE SCREENING TOOL     IDENTIFICATION  Patient Name: Valerie [name] Birthdate: 20-Nov-1945 Sex: female Admission Date (Current Location): 10/01/2021  [facility] and [place] Number:  Herbalist and Address:  [facility] [place], [place] [address] [place], [place] [address]      Provider Number: [id number]  Attending Physician Name and Address:  Jonetta Osgood, MD  Relative Name and Phone Number:       Current Level of Care: Hospital Recommended Level of Care: [place] Prior Approval Number:    Date Approved/Denied:   PASRR Number: [id number]  Discharge Plan: Other (Comment) (ALF)    Current Diagnoses: Patient Active Problem List   Diagnosis Date Noted   Dysarthria 99/35/7017   Acute metabolic encephalopathy 79/39/0300   UTI (urinary tract infection) 10/01/2021   COVID-19 virus infection 10/01/2021   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   PAF (paroxysmal atrial fibrillation) ([place]) 07/07/2021   History of gross hematuria 07/07/2021   Atherosclerosis of aorta ([place]) 92/33/0076   Complicated UTI (urinary tract infection) 04/17/2021   Malnutrition of moderate degree 06/13/2020   Rash and nonspecific skin eruption    Metastatic malignant neoplasm ([place])    Failure to thrive in adult    Asymptomatic bacteriuria    Pressure injury of skin 06/03/2020   Cellulitis 06/03/2020   SVT (supraventricular tachycardia) ([place]) 03/26/2020   Normocytic anemia 03/24/2020   Urinary tract infection without hematuria    AMS (altered mental status) 03/23/2020   Hypokalemia 03/23/2020   Prolonged Q-T interval on ECG 03/23/2020   DNR (do not resuscitate) 03/23/2020   MRSA bacteremia 03/23/2020   Acute combined systolic and diastolic heart failure (HCC)    Elevated troponin    Dilated cardiomyopathy ([place])    Nonocclusive coronary atherosclerosis of native coronary artery    Congestive heart failure (CHF)  ([place]) 02/14/2020   Pyelonephritis 09/10/2019   Acute blood loss anemia 06/12/2019   Closed fracture of distal end of right femur, initial encounter ([place]) 06/11/2019   Closed fracture of lateral portion of right tibial plateau 06/11/2019   History of DVT of lower extremity 06/11/2019   Blurry vision 01/06/2019   Headache 07/24/2016   Cough 08/24/2015   Chronic diarrhea 08/24/2015   Sepsis (JAARS)    Encounter for antineoplastic chemotherapy 01/31/2015   Urinary retention 10/20/2014   Ileus ([place]) 10/20/2014   Cellulitis of leg, right 10/20/2014   Fracture of right proximal fibula    Ankle fracture 10/02/2014   Ankle fracture 10/02/2014   Lung cancer ([place]) 10/02/2014   Closed fibular fracture 10/02/2014   Dementia ([place]) 10/02/2014   Lower urinary tract infectious disease 10/02/2014   Reactive airway disease 10/02/2014   Fall 10/02/2014   Closed left ankle fracture 10/01/2014   Right fibular fracture 10/01/2014   Lung metastasis ([place])    LESION, ANUS 04/26/2009   DIARRHEA 04/26/2009   Primary cancer of right upper lobe of lung ([place]) 04/22/2009   Malignant neoplasm of brain ([place]) 04/22/2009   VITAMIN D DEFICIENCY 04/22/2009   ANAL FISTULA 04/22/2009   OSTEOPOROSIS 04/22/2009    Orientation RESPIRATION BLADDER Height & Weight     Time, Self, Situation, Place  Normal Incontinent Weight: 164 lb 7.4 oz (74.6 kg) Height:  5\' 8"  (172.7 cm)  BEHAVIORAL SYMPTOMS/MOOD NEUROLOGICAL BOWEL NUTRITION STATUS      Incontinent Diet (Regular)  AMBULATORY STATUS COMMUNICATION OF NEEDS Skin   Extensive Assist Verbally PU  Stage and Appropriate Care (MASD on buttocks; Stage II on rectum)                       Personal Care Assistance Level of Assistance  Bathing, Feeding, Dressing Bathing Assistance: Maximum assistance Feeding assistance: Limited assistance Dressing Assistance: Limited assistance     Functional Limitations Info             [place]  PT (By  licensed PT)     PT Frequency: Home Health PT              Contractures Contractures Info: Not present    Additional Factors Info  Code Status, Allergies, Isolation Precautions Code Status Info: DNR Allergies Info: Broccoli (Brassica Oleracea), Fruit Extracts, Sulfa Antibiotics     Isolation Precautions Info: COVID + on 10/01/21     Current Medications (10/10/2021):  This is the current hospital active medication list Current Facility-Administered Medications  Medication Dose Route Frequency Provider Last Rate Last Admin   acetaminophen (TYLENOL) tablet 650 mg  650 mg Oral D1V PRN Pershing Proud, NP   616 mg at 10/10/21 0912   donepezil (ARICEPT) tablet 10 mg  10 mg Oral QHS Tu, Ching T, DO   10 mg at 10/09/21 2054   enoxaparin (LOVENOX) injection 40 mg  40 mg Subcutaneous Q24H Tu, Ching T, DO   40 mg at 10/10/21 0737   feeding supplement (ENSURE ENLIVE / ENSURE PLUS) liquid 237 mL  237 mL Oral BID BM Shelly Coss, MD   237 mL at 10/10/21 1062   Gerhardt's butt cream   Topical BID Shelly Coss, MD   Given at 10/10/21 0913   loperamide (IMODIUM) capsule 2 mg  2 mg Oral PRN Pershing Proud, NP   2 mg at 10/08/21 2312   melatonin tablet 3 mg  3 mg Oral QHS PRN Pershing Proud, NP       ondansetron [facility]) injection 4 mg  4 mg Intravenous Q6H PRN Shelly Coss, MD   4 mg at 10/03/21 1521   saccharomyces boulardii (FLORASTOR) capsule 250 mg  250 mg Oral BID Pershing Proud, NP   694 mg at 10/10/21 8546     Discharge Medications: Please see discharge summary for a list of discharge medications.  Relevant Imaging Results:  Relevant Lab Results:   Additional Information SSN: [id number]. Add palliative care  Benard Halsted, LCSW

## 2021-10-10 NOTE — TOC Progression Note (Addendum)
Transition of Care [facility]) - Progression Note    Patient Details  Name: Valerie [name] MRN: [id number] Date of Birth: [birth year]  Transition of Care [facility]) CM/SW [place], LCSW Phone Number: 10/10/2021, 9:17 AM  Clinical Narrative:    CSW spoke with Shazma at [facility] ALF. She stated they are ready to admit patient today. CSW will fax over DC Summary, Fl2, and home health orders. CSW notified Jenn with [facility] of patient's discharge and confirmed [place] has a Soil scientist with [employer]. Made [facility] Palliative program aware of discharge. CSW scheduled [facility] transport for 3:30pm. CSW updated patient's daughter, [name].    Expected Discharge Plan: Assisted Living Barriers to Discharge: Barriers Resolved  Expected Discharge Plan and Services Expected Discharge Plan: Assisted Living In-house Referral: Clinical Social Work   Post Acute Care Choice: Resumption of Svcs/PTA Provider Living arrangements for the past 2 months: [place] Expected Discharge Date: 10/04/21                         HH Arranged: RN [place] Agency: [facility] Date [place]: 10/04/21 Time [place]: [id number] Representative spoke with at [place]: [place] ([place]) Interventions    Readmission Risk Interventions Readmission Risk Prevention Plan 02/18/2020  Transportation Screening Complete  PCP or Specialist Appt within 5-7 Days Complete  Home Care Screening Patient refused  Medication Review (RN CM) Complete  Some recent data might be hidden

## 2021-10-10 NOTE — Progress Notes (Signed)
Patient was seen and examined-she is awake/alert-and is stable to be discharged to [facility] today.  Discharge summary already done on 2/20-is up-to-date-medication list remains up-to-date as well.  Stable for discharge today.

## 2021-10-10 NOTE — Progress Notes (Signed)
Multiple calls made to give d/c report but no one answered at [facility].

## 2021-10-13 ENCOUNTER — Ambulatory Visit (HOSPITAL_BASED_OUTPATIENT_CLINIC_OR_DEPARTMENT_OTHER): Payer: Medicare PPO | Admitting: Cardiology

## 2021-10-26 DIAGNOSIS — N390 Urinary tract infection, site not specified: Secondary | ICD-10-CM | POA: Diagnosis not present

## 2021-10-26 DIAGNOSIS — U071 COVID-19: Secondary | ICD-10-CM | POA: Diagnosis not present

## 2021-10-26 DIAGNOSIS — B9620 Unspecified Escherichia coli [E. coli] as the cause of diseases classified elsewhere: Secondary | ICD-10-CM | POA: Diagnosis not present

## 2021-10-31 ENCOUNTER — Non-Acute Institutional Stay: Payer: Medicare PPO | Admitting: Hospice

## 2021-10-31 ENCOUNTER — Other Ambulatory Visit: Payer: Self-pay

## 2021-10-31 DIAGNOSIS — C3490 Malignant neoplasm of unspecified part of unspecified bronchus or lung: Secondary | ICD-10-CM

## 2021-10-31 DIAGNOSIS — F0150 Vascular dementia without behavioral disturbance: Secondary | ICD-10-CM | POA: Diagnosis not present

## 2021-10-31 DIAGNOSIS — R531 Weakness: Secondary | ICD-10-CM | POA: Diagnosis not present

## 2021-10-31 DIAGNOSIS — Z515 Encounter for palliative care: Secondary | ICD-10-CM | POA: Diagnosis not present

## 2021-10-31 NOTE — Progress Notes (Signed)
? ? ?Manufacturing engineer ?Community Palliative Care Consult Note ?Telephone: [phone]  ?Fax: [phone] ? ?PATIENT NAME: Valerie [name] ?[address] ?[place] [address] ?[phone] (home) [phone] (work) ?DOB: March 27, 1946 ?MRN: [id number] ? ?PRIMARY CARE PROVIDER:    ?Roetta Sessions, NP,  ?[address] ?[place] [address] ?[phone] ? ?REFERRING PROVIDER:   ?Dr Reymundo Poll ? ?RESPONSIBLE PARTY:   [name] - daughter ?Contact Information   ? ? Name Relation Home Work Mobile  ? Aldean Jewett Daughter [phone]  [phone]  ? [facility] Daughter [phone]  [phone]  ? Rayna Sexton Sister [phone]  [phone]  ? ?  ? ? ? ?I met face to face with patient at facility. Visit to build trust and highlight Palliative Medicine as specialized medical care for people living with serious illness, aimed at facilitating better quality of life through symptoms relief, assisting with advance care planning and complex medical decision making. NP called [name] and updated her on visit. She provided additional history as patient has cognitive deficits related to Dementia.  ?ASSESSMENT AND / RECOMMENDATIONS:  ? ?Advance Care Planning: Our advance care planning conversation included a discussion about:    ?The value and importance of advance care planning  ?Difference between Hospice and Palliative care ?Exploration of goals of care in the event of a sudden injury or illness  ?Identification and preparation of a healthcare agent  ?Review and updating or creation of an  advance directive document . ?Decision not to resuscitate or to de-escalate disease focused treatments due to poor prognosis. ? ?CODE STATUS: Patient is a Do Not Resuscitate.  ? ?Goals of Care: Goals include to maximize quality of life and symptom management. [name] added that patient wishes to regain her strength and be able to participate more in her activities of daily living.  She also  said that family will be interested in hospice service in the future. ? ?Patient reported her spouse died 06-17-2021.  Ample emotional support provided.  Visit consisted of counseling and education dealing with the complex and emotionally intense issues of symptom management and palliative care in the setting of serious and potentially life-threatening illness. Palliative care team will continue to support patient, patient's family, and medical team. ? ?I spent  16 minutes providing this initial consultation. More than 50% of the time in this consultation was spent on counseling patient and coordinating communication. ?-------------------------------------------------------------------------------------------------------------------------------------- ? ?Symptom Management/Plan: ?Dementia: Memory loss/confusion in line with dementia disease trajectory.  Fast 6D.  Encourage reminiscence, word search/puzzles, cueing for recollection.  Continue Aricept as ordered.  Optimize ongoing supportive care. ?Metastatic lung CA: No more chemo/radiation.  Continue ongoing supportive care ?Weakness: PT/OT is ongoing for strengthening, gait training for transfers.  Fall precautions in place. ? ?Follow up: Palliative care will continue to follow for complex medical decision making, advance care planning, and clarification of goals. Return 6 weeks or prn. Encouraged to call provider sooner with any concerns.  ? ?Family /Caregiver/Community Supports: Patient in assisted living for ongoing care.  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 ? ?HOSPICE ELIGIBILITY/DIAGNOSIS: TBD ? ?Chief Complaint: Follow-up visit ? ?HISTORY OF PRESENT ILLNESS:  Valerie [name] is a 76 y.o. 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 ?History obtained from review of EMR, discussion with primary team, caregiver, family  and/or Ms. Hoaglund.  ?Review and summarization of Epic records shows history from other than patient. Rest of 10 point ROS asked and negative.  ?I reviewed as needed, available labs, patient records, imaging, studies and related documents from the EMR ? ? ?Physical Exam: ?Constitutional: NAD ?General: Well groomed, cooperative ?EYES: anicteric sclera, lids intact, no discharge  ?ENMT: Moist mucous membrane ?CV: S1 S2, RRR, no LE edema ?Pulmonary: LCTA, no increased work of breathing, no cough, ?Abdomen: active BS + 4 quadrants, soft and non tender ?GU: no suprapubic tenderness ?MSK: weakness, limited ROM ?Neuro:  weakness, otherwise non focal, memory loss ?Psych: non-anxious affect ?Hem/lymph/immuno: no widespread bruising ? ? ?PAST MEDICAL HISTORY:  ?Active Ambulatory Problems  ?  Diagnosis Date Noted  ? Primary cancer of right upper lobe of lung ([place]) 04/22/2009  ? Malignant neoplasm of brain ([place]) 04/22/2009  ? VITAMIN D DEFICIENCY 04/22/2009  ? ANAL FISTULA 04/22/2009  ? LESION, ANUS 04/26/2009  ? OSTEOPOROSIS 04/22/2009  ? DIARRHEA 04/26/2009  ? Lung metastasis ([place])   ? Closed left ankle fracture 10/01/2014  ? Right fibular fracture 10/01/2014  ? Ankle fracture 10/02/2014  ? Ankle fracture 10/02/2014  ? Lung cancer ([place]) 10/02/2014  ? Closed fibular fracture 10/02/2014  ? Dementia ([place]) 10/02/2014  ? Lower urinary tract infectious disease 10/02/2014  ? Reactive airway disease 10/02/2014  ? Fall 10/02/2014  ? Fracture of right proximal fibula   ? Urinary retention 10/20/2014  ? Ileus ([place]) 10/20/2014  ? Cellulitis of leg, right 10/20/2014  ? Encounter for antineoplastic chemotherapy 01/31/2015  ? Cough 08/24/2015  ? Chronic diarrhea 08/24/2015  ? Sepsis ([place])   ? Headache 07/24/2016  ? Blurry vision 01/06/2019  ? Closed fracture of distal end of right femur, initial encounter ([place]) 06/11/2019  ? Closed fracture of  lateral portion of right tibial plateau 06/11/2019  ? History of DVT of lower extremity 06/11/2019  ? Acute blood loss anemia 06/12/2019  ? Pyelonephritis 09/10/2019  ? Congestive heart failure (CHF) ([place]) 02/14/2020  ? Acute combined systolic and diastolic heart failure ([place])   ? Elevated troponin   ? Dilated cardiomyopathy ([place])   ? Nonocclusive coronary atherosclerosis of native coronary artery   ? AMS (altered mental status) 03/23/2020  ? Hypokalemia 03/23/2020  ? Prolonged Q-T interval on ECG 03/23/2020  ? DNR (do not resuscitate) 03/23/2020  ? MRSA bacteremia 03/23/2020  ? Normocytic anemia 03/24/2020  ? Urinary tract infection without hematuria   ? SVT (supraventricular tachycardia) ([place]) 03/26/2020  ? Pressure injury of skin 06/03/2020  ? Cellulitis 06/03/2020  ? Rash and nonspecific skin eruption   ? Metastatic malignant neoplasm ([place])   ? Failure to thrive in adult   ? Asymptomatic bacteriuria   ? Malnutrition of moderate degree 06/13/2020  ? Complicated UTI (urinary tract infection) 04/17/2021  ? Atherosclerosis of aorta ([place]) 05/11/2021  ? PAF (paroxysmal atrial fibrillation) ([place]) 07/07/2021  ? History of gross hematuria 07/07/2021  ? 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 Dysarthria 10/01/2021  ? Acute metabolic encephalopathy 56/38/7564  ? UTI (urinary tract infection) 10/01/2021  ? COVID-19 virus infection 10/01/2021  ? ?Resolved Ambulatory Problems  ?  Diagnosis Date Noted  ? Dementia ([place]) 04/22/2009  ? DVT, lower extremity ([place]) 10/20/2014  ? SIRS (systemic inflammatory response syndrome) ([place]) 08/24/2015  ? HCAP (healthcare-associated pneumonia) 08/24/2015  ? Influenza A 08/25/2015  ? ?Past Medical History:  ?Diagnosis Date  ? Abnormal Pap smear 2006  ? Anxiety   ? Arthritis   ?  Atrophic vaginitis 2008  ? Cataract   ? Dyspareunia 2008  ? H/O osteoporosis   ? H/O varicella   ? H/O vitamin D deficiency   ? Heart murmur   ? History of measles, mumps, or rubella   ? History of radiation  therapy 07/28/13- 08/10/13  ? Hypertension   ? Metastasis to brain [facility]) dx'd 2008  ? Nodule of right lung CT- 06/03/12  ? Nodule of right lung 06/03/12  ? On antineoplastic chemotherapy   ? Osteoporosis 2010  ? [place]

## 2021-11-03 ENCOUNTER — Telehealth: Payer: Self-pay

## 2021-11-03 NOTE — Telephone Encounter (Signed)
Message received from Sunday Corn who states she is completing an annual audit survey of [place], where pt is located, and wants to confirm of pt should or should not be taking Tarceva. ? ?I have reviewed pt last OV with Dr. Julien Nordmann and advised per his 09/18/21 progress note "I recommended for the patient to continue her treatment with Tarceva with the same dose and use Imodium for the diarrhea but the patient and her daughter are more interested in comfort care at this point and she would like to discontinue her treatment with Tarceva at this point. I agreed with their plan and will continue to monitor her on as-needed basis." ? ?Debbrah Alar expressed understanding of this information. ?

## 2021-11-18 IMAGING — CT CT ABD-PELV W/ CM
2 of 5 series · 14 of 46 positions shown, 16 images · IV contrast (APPLIED)
Comparison: September 10, 2019

CLINICAL DATA: Rectal pain with defecation.

EXAM:
CT ABDOMEN AND PELVIS WITH CONTRAST
TECHNIQUE: Multidetector CT imaging of the abdomen and pelvis was performed
using the standard protocol following bolus administration of
intravenous contrast.
CONTRAST:  100mL OMNIPAQUE IOHEXOL 300 MG/ML  SOLN

[Series 3: abdomen 5.0 · axial · 0.71mm/px · z∈[-266,+169]mm · 11 of 103 slices shown, 13 images]
[im 8/103  soft-tissue]
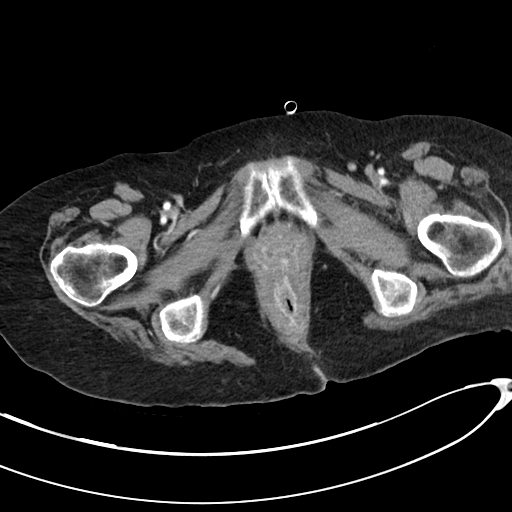
[im 8/103  bone]
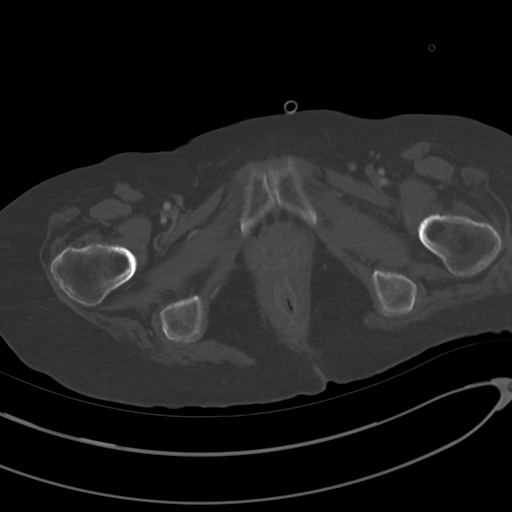
[im 15/103  soft-tissue]
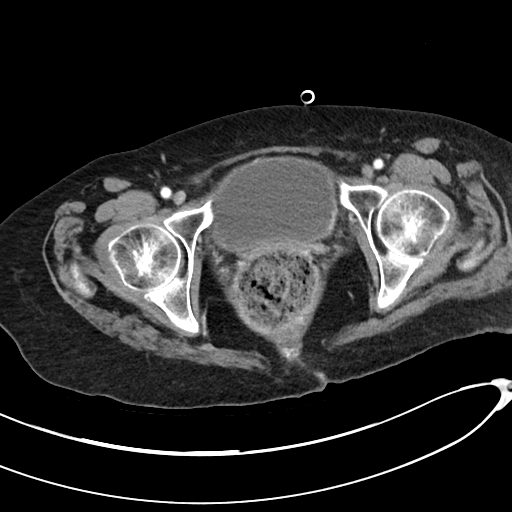
[im 22/103  soft-tissue]
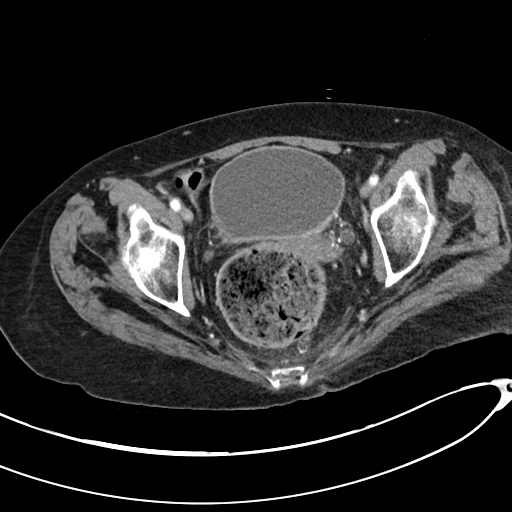
[im 37/103  soft-tissue]
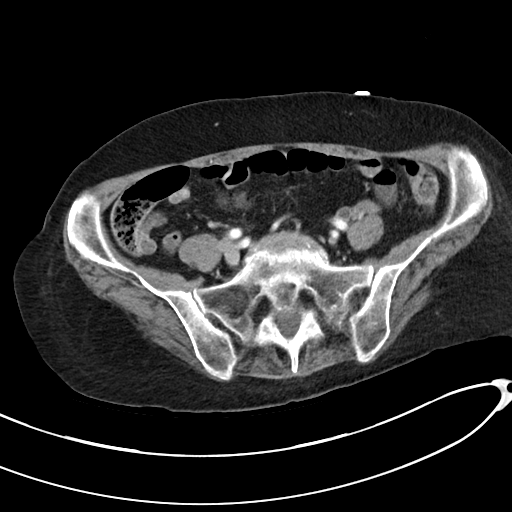
[im 44/103  soft-tissue]
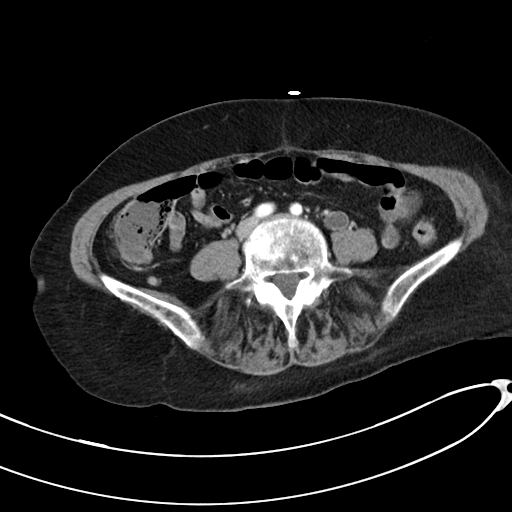
[im 52/103  soft-tissue]
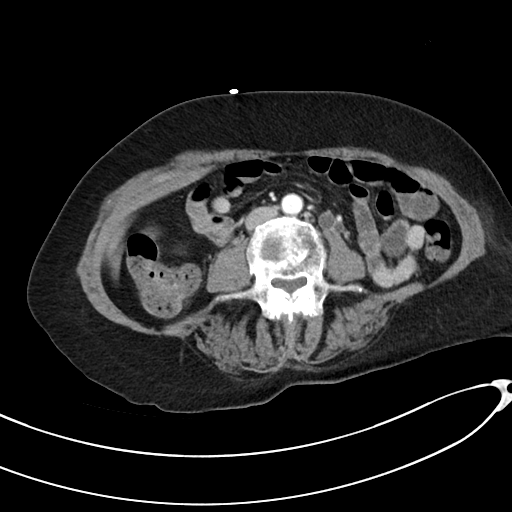
[im 59/103  soft-tissue]
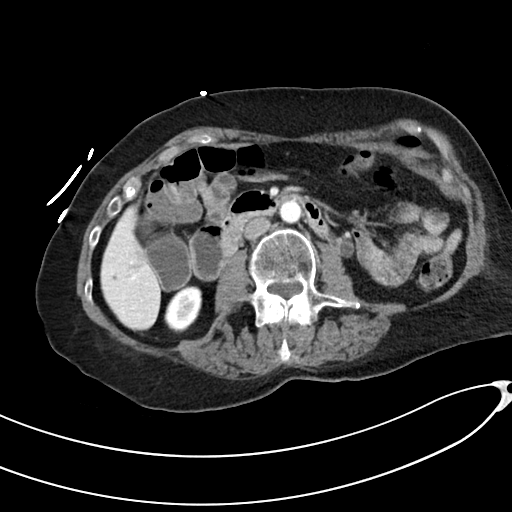
[im 66/103  soft-tissue]
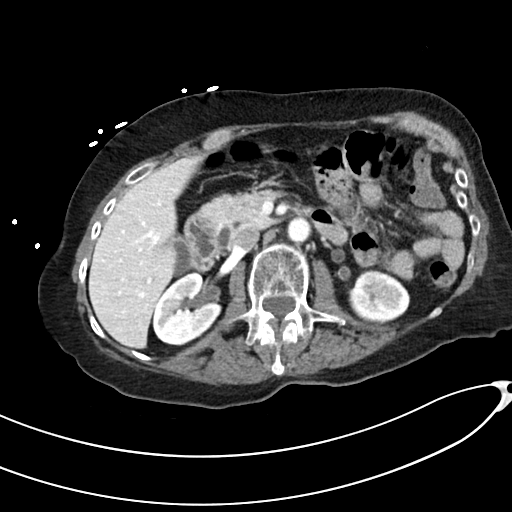
[im 81/103  soft-tissue]
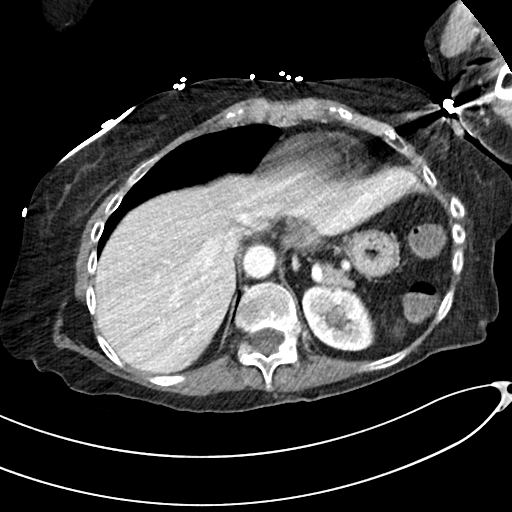
[im 81/103  bone]
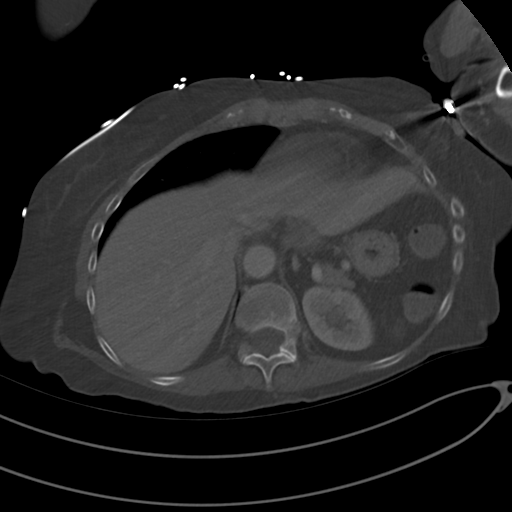
[im 88/103  soft-tissue]
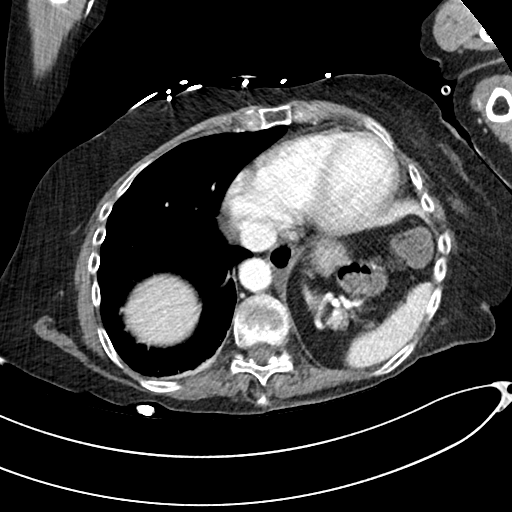
[im 95/103  soft-tissue]
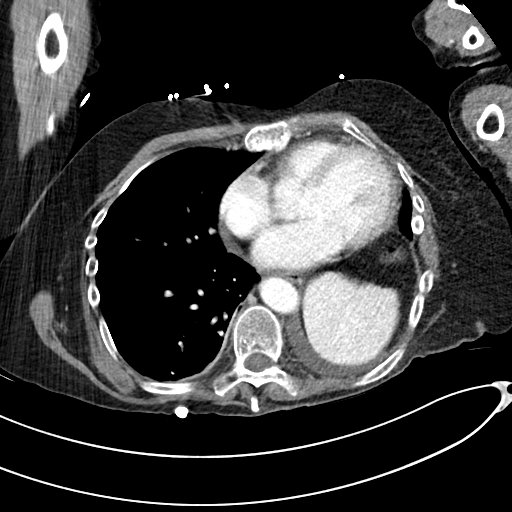

[Series 6: abdomen 3.0 mpr cor · coronal · 0.64mm/px · 3 of 78 slices shown]
[im 26/78  soft-tissue]
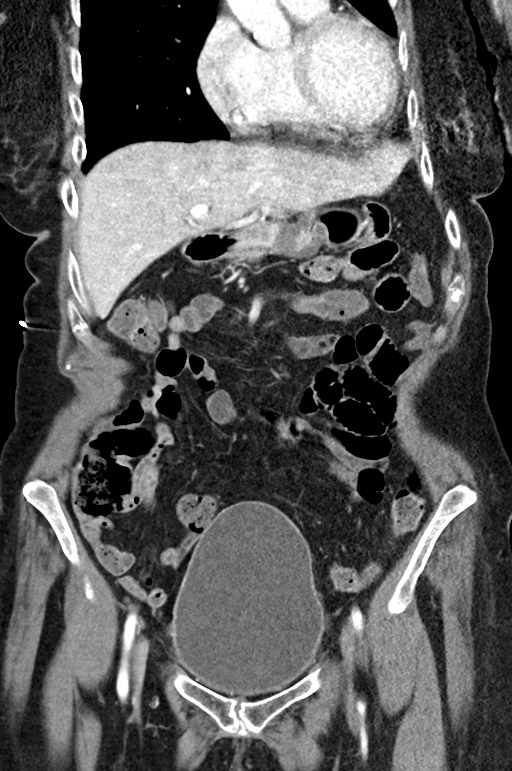
[im 35/78  soft-tissue]
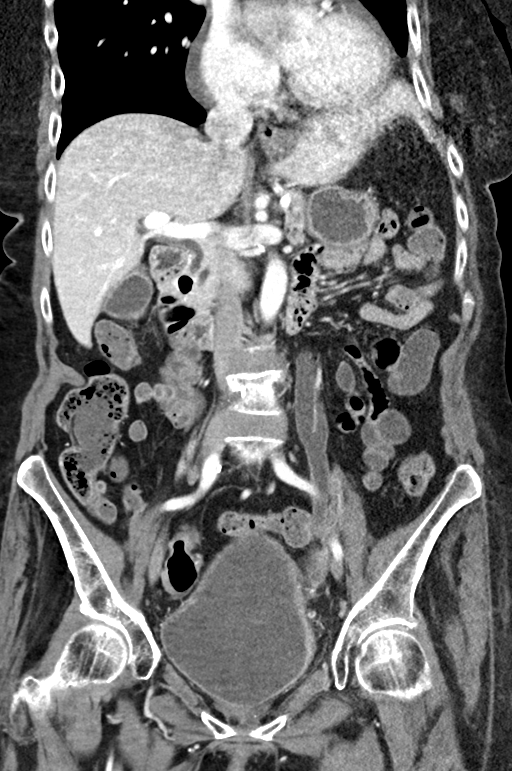
[im 43/78  soft-tissue]
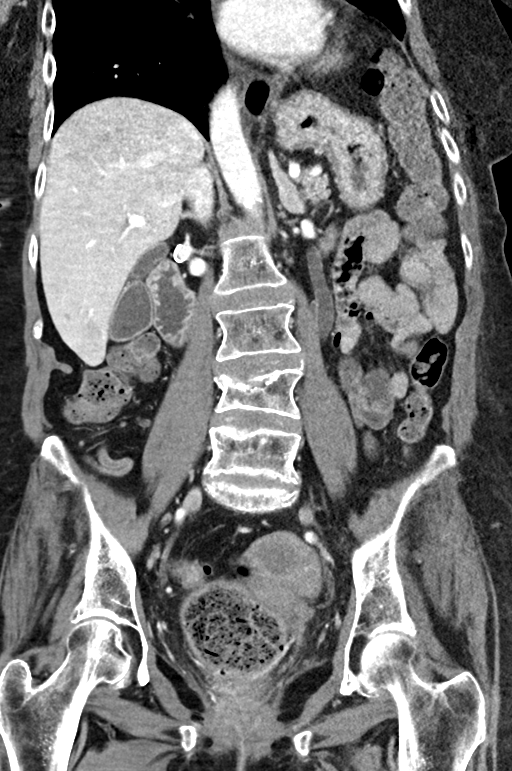

[14 of 46 positions shown; findings below may reference images not displayed]

FINDINGS: Lower chest: Postoperative changes are again seen involving the
lower left hemithorax with mild atelectasis and/or infiltrate is
seen within the posterior aspect of the left lung base.

Elevation of the left hemidiaphragm is noted.

There is a small left pleural effusion.

Hepatobiliary: Stable tiny foci of parenchymal low attenuation are
seen within the posterior aspect of right lobe of the liver (axial
CT images 31 and 45, CT series number 3). No gallstones, gallbladder
wall thickening, or biliary dilatation.

Pancreas: Unremarkable. No pancreatic ductal dilatation or
surrounding inflammatory changes.

Spleen: Normal in size without focal abnormality.

Adrenals/Urinary Tract: Adrenal glands are unremarkable. Kidneys are
normal in size without focal lesions. A duplicated renal collecting
system is seen on the left with moderate to marked severity
left-sided hydronephrosis and hydroureter. This extends to the level
of the urinary bladder. Congenital fusion of the duplicated ureters
is seen proximal to this point. Bladder is unremarkable.

Stomach/Bowel: Stomach is within normal limits. Appendix appears
normal. A large amount of stool is seen within the distal sigmoid
colon with subsequent sigmoid colon dilatation (8.0 cm x 7.4 cm).
This is mildly increased in size when compared to the prior exam.
Mild stable rectal wall thickening is seen. No evidence of small
bowel dilatation. Noninflamed diverticula are seen throughout the
sigmoid colon.

Mild, stable perirectal inflammatory fat stranding is seen on the
right, without evidence of associated fluid collection or abscess.

Vascular/Lymphatic: No significant vascular findings are present. No
enlarged abdominal or pelvic lymph nodes.

Reproductive: Uterus and bilateral adnexa are unremarkable.

Other: No abdominal wall hernia or abnormality. No abdominopelvic
ascites.

Musculoskeletal: Multilevel degenerative changes seen throughout the
lumbar spine with chronic compression fracture deformities involving
the L1 and L4 vertebral bodies.
IMPRESSION: 1. Large amount of stool within the distal sigmoid colon with
subsequent sigmoid colon dilatation, consistent with persistent
fecal impaction.
2. Stable mild rectal wall thickening, possibly representing
proctitis.
3. Duplicated left renal collecting system with moderate to marked
left-sided hydronephrosis and hydroureter.
4. Small left pleural effusion.
5. Mild atelectasis and/or infiltrate left lung base.
6. Stable tiny foci of low attenuation within the posterior aspect
of the right lobe of the liver, too small to characterize.
7. Chronic compression fracture deformities involving L1 and L4
vertebral bodies.

## 2021-11-22 DIAGNOSIS — M25571 Pain in right ankle and joints of right foot: Secondary | ICD-10-CM | POA: Diagnosis not present

## 2021-11-22 DIAGNOSIS — M79671 Pain in right foot: Secondary | ICD-10-CM | POA: Diagnosis not present

## 2021-11-22 DIAGNOSIS — M19071 Primary osteoarthritis, right ankle and foot: Secondary | ICD-10-CM | POA: Diagnosis not present

## 2021-12-22 ENCOUNTER — Non-Acute Institutional Stay: Payer: Medicare PPO | Admitting: Hospice

## 2021-12-22 DIAGNOSIS — R531 Weakness: Secondary | ICD-10-CM

## 2021-12-22 DIAGNOSIS — F0150 Vascular dementia without behavioral disturbance: Secondary | ICD-10-CM

## 2021-12-22 DIAGNOSIS — Z515 Encounter for palliative care: Secondary | ICD-10-CM

## 2021-12-22 DIAGNOSIS — C3490 Malignant neoplasm of unspecified part of unspecified bronchus or lung: Secondary | ICD-10-CM

## 2021-12-22 NOTE — Progress Notes (Signed)
? ? ?Manufacturing engineer ?Community Palliative Care Consult Note ?Telephone: [phone]  ?Fax: [phone] ? ?PATIENT NAME: Valerie [name] ?[address] ?[place] [address] ?[phone] (home) [phone] (work) ?DOB: December 06, 1945 ?MRN: [id number] ? ?PRIMARY CARE PROVIDER:    ?Roetta Sessions, NP,  ?[address] ?[place] [address] ?[phone] ? ?REFERRING PROVIDER:   ?Dr Reymundo Poll ? ?RESPONSIBLE PARTY:   [name] - daughter ? ?Contact Information   ? ? Name Relation Home Work Mobile  ? Aldean Jewett Daughter [phone]  [phone]  ? [facility] Daughter [phone]  [phone]  ? Rayna Sexton Sister [phone]  [phone]  ? ?  ? ? ? ?I met face to face with patient at facility. Visit to build trust and highlight Palliative Medicine as specialized medical care for people living with serious illness, aimed at facilitating better quality of life through symptoms relief, assisting with advance care planning and complex medical decision making. Valerie [name] - relative is with patient during visit.   ?ASSESSMENT AND / RECOMMENDATIONS:  ? ?CODE STATUS: Patient is a Do Not Resuscitate.  ? ?Goals of Care: Goals include to maximize quality of life and symptom management. She also said that family will be interested in hospice service in the future. ? ?Visit consisted of counseling and education dealing with the complex and emotionally intense issues of symptom management and palliative care in the setting of serious and potentially life-threatening illness. Palliative care team will continue to support patient, patient's family, and medical team. ? ?Symptom Management/Plan: ?Dementia: worsening memory loss/confusion in line with dementia disease trajectory.  Fast 6D.  Fall and safety precautions. Encourage reminiscence, word search/puzzles, cueing for recollection.  Continue Aricept as ordered.  Optimize ongoing supportive care. ?Metastatic lung CA: No more  chemo/radiation.  Continue ongoing supportive care ?Weakness: Patient completed PT/OT; she is non ambulatory, max assist for transfers, stand by hoyer lift.  ?Follow up: Palliative care will continue to follow for complex medical decision making, advance care planning, and clarification of goals. Return 6 weeks or prn. Encouraged to call provider sooner with any concerns.  ? ?Family /Caregiver/Community Supports: Patient in assisted living for ongoing care.  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 ? ?HOSPICE ELIGIBILITY/DIAGNOSIS: TBD ? ?Chief Complaint: Follow-up visit ? ?HISTORY OF PRESENT ILLNESS:  Valerie [name] is a 76 y.o. 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  Patient continues to decline in cognitive and functional status, no longer able to ambulate, max assist with transfers. ?History obtained from review of EMR, discussion with primary team, caregiver, family and/or Ms. Pietro.  ?Review and summarization of Epic records shows history from other than patient. Rest of 10 point ROS asked and negative.  ?I reviewed as needed, available labs, patient records, imaging, studies and related documents from the EMR ? ? ?Physical Exam: ?Constitutional: NAD ?General: Well groomed, cooperative ?EYES: anicteric sclera, lids intact, no discharge  ?ENMT: Moist mucous membrane ?CV: S1 S2, RRR, no LE edema ?Pulmonary: LCTA, no increased work of breathing, no cough, ?Abdomen: active BS + 4 quadrants, soft and non tender ?GU: no suprapubic tenderness ?MSK: weakness, limited ROM, non ambulatory, slowly self propels on her wheelchair ?Neuro:  weakness, otherwise non focal, memory loss/confusion ?Psych: non-anxious affect ?Hem/lymph/immuno: no widespread bruising ? ? ?PAST MEDICAL HISTORY:  ?Active  Ambulatory  Problems  ?  Diagnosis Date Noted  ? Primary cancer of right upper lobe of lung (HCC) 04/22/2009  ? Malignant neoplasm of brain (HCC) 04/22/2009  ? VITAMIN D DEFICIENCY 04/22/2009  ? ANAL FISTULA 04/22/2009  ? LESION, ANUS 04/26/2009  ? OSTEOPOROSIS 04/22/2009  ? DIARRHEA 04/26/2009  ? Lung metastasis   ? Closed left ankle fracture 10/01/2014  ? Right fibular fracture 10/01/2014  ? Ankle fracture 10/02/2014  ? Ankle fracture 10/02/2014  ? Lung cancer (HCC) 10/02/2014  ? Closed fibular fracture 10/02/2014  ? Dementia (HCC) 10/02/2014  ? Lower urinary tract infectious disease 10/02/2014  ? Reactive airway disease 10/02/2014  ? Fall 10/02/2014  ? Fracture of right proximal fibula   ? Urinary retention 10/20/2014  ? Ileus (HCC) 10/20/2014  ? Cellulitis of leg, right 10/20/2014  ? Encounter for antineoplastic chemotherapy 01/31/2015  ? Cough 08/24/2015  ? Chronic diarrhea 08/24/2015  ? Sepsis (HCC)   ? Headache 07/24/2016  ? Blurry vision 01/06/2019  ? Closed fracture of distal end of right femur, initial encounter (HCC) 06/11/2019  ? Closed fracture of lateral portion of right tibial plateau 06/11/2019  ? History of DVT of lower extremity 06/11/2019  ? Acute blood loss anemia 06/12/2019  ? Pyelonephritis 09/10/2019  ? Congestive heart failure (CHF) (HCC) 02/14/2020  ? Acute combined systolic and diastolic heart failure (HCC)   ? Elevated troponin   ? Dilated cardiomyopathy (HCC)   ? Nonocclusive coronary atherosclerosis of native coronary artery   ? AMS (altered mental status) 03/23/2020  ? Hypokalemia 03/23/2020  ? Prolonged Q-T interval on ECG 03/23/2020  ? DNR (do not resuscitate) 03/23/2020  ? MRSA bacteremia 03/23/2020  ? Normocytic anemia 03/24/2020  ? Urinary tract infection without hematuria   ? SVT (supraventricular tachycardia) (HCC) 03/26/2020  ? Pressure injury of skin 06/03/2020  ? Cellulitis 06/03/2020  ? Rash and nonspecific skin eruption   ? Metastatic malignant neoplasm (HCC)   ? Failure to  thrive in adult   ? Asymptomatic bacteriuria   ? Malnutrition of moderate degree 06/13/2020  ? Complicated UTI (urinary tract infection) 04/17/2021  ? Atherosclerosis of aorta (HCC) 05/11/2021  ? PAF (paroxysmal atrial fibrillation) (HCC) 07/07/2021  ? History of gross hematuria 07/07/2021  ? 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 Dysarthria 10/01/2021  ? Acute metabolic encephalopathy 10/01/2021  ? UTI (urinary tract infection) 10/01/2021  ? COVID-19 virus infection 10/01/2021  ? ?Resolved Ambulatory Problems  ?  Diagnosis Date Noted  ? Dementia (HCC) 04/22/2009  ? DVT, lower extremity (HCC) 10/20/2014  ? SIRS (systemic inflammatory response syndrome) (HCC) 08/24/2015  ? HCAP (healthcare-associated pneumonia) 08/24/2015  ? Influenza A 08/25/2015  ? ?Past Medical History:  ?Diagnosis Date  ? Abnormal Pap smear 2006  ? Anxiety   ? Arthritis   ? Atrophic vaginitis 2008  ? Cataract   ? Dyspareunia 2008  ? H/O osteoporosis   ? H/O varicella   ? H/O vitamin D deficiency   ? Heart murmur   ? History of measles, mumps, or rubella   ? History of radiation therapy 07/28/13- 08/10/13  ? Hypertension   ? Metastasis to brain (HCC) dx'd 2008  ? Nodule of right lung CT- 06/03/12  ? Nodule of right lung 06/03/12  ? On antineoplastic chemotherapy   ? Osteoporosis 2010  ? Shortness of breath   ? Status post chemotherapy 2003  ? Status post radiation therapy 2003  ? Status post radiation therapy 11/07/2005  ? Status post radiation therapy   06/02/2008  ? Thyroid adenoma   ? Thyroid cancer (HCC) 10/18/11 bx  ? Yeast infection   ? ? ?SOCIAL HX:  ?Social History  ? ?Tobacco Use  ? Smoking status: Never  ? Smokeless tobacco: Never  ? Tobacco comments:  ?  smoked few years in college  ?Substance Use Topics  ? Alcohol use: No  ? ?  ?FAMILY HX:  ?Family History  ?Problem Relation Age of Onset  ? Gait disorder Mother   ? Cancer Mother   ?     Meningioma  ? Cancer Father   ?     Pancreatic  ? Heart failure Sister   ? Cancer Maternal Aunt    ?     menigeoma  ?   ? ?ALLERGIES:  ?Allergies  ?Allergen Reactions  ? Broccoli [Brassica Oleracea] Other (See Comments)  ?  unknown  ? Fruit Extracts Other (See Comments)  ?  unknown  ? Sulfa Antibiotics Ot

## 2021-12-22 NOTE — Addendum Note (Signed)
Addended by: Laverda Sorenson I on: 12/22/2021 03:17 PM ? ? Modules accepted: Level of Service ? ?

## 2021-12-24 IMAGING — CT CT NECK W/ CM
3 of 4 series · 13 of 33 positions shown, 16 images · IV contrast (omnipaque)
Comparison: 12/23/2018

CLINICAL DATA: Lung cancer post treatment, staging

EXAM:
CT NECK WITH CONTRAST
TECHNIQUE: Multidetector CT imaging of the neck was performed using the
standard protocol following the bolus administration of intravenous
contrast.
CONTRAST:  75mL OMNIPAQUE IOHEXOL 300 MG/ML  SOLN

[Series 6: orthogonal ax · axial · 0.39mm/px · z∈[-193,-20]mm · 5 of 136 slices shown, 7 images]
[im 20/136  soft-tissue]
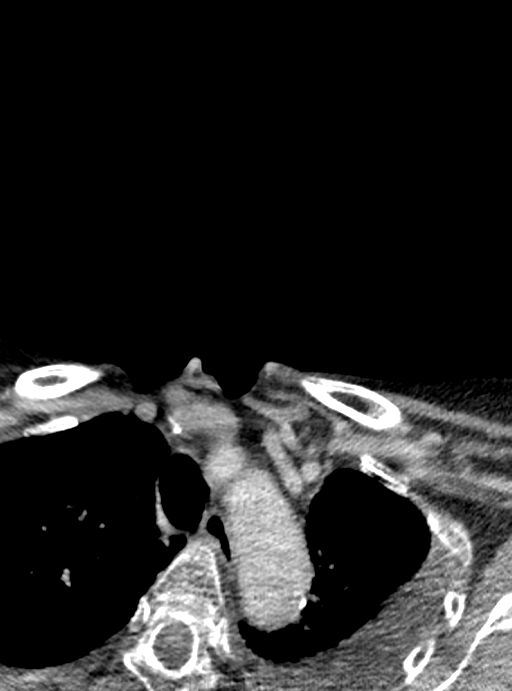
[im 20/136  bone]
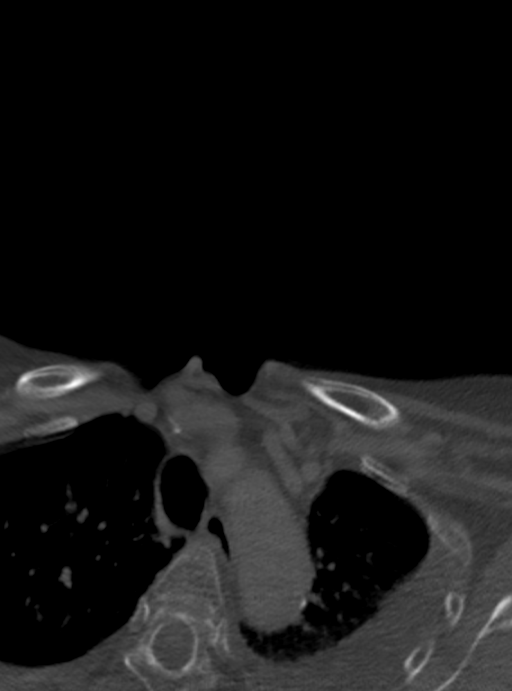
[im 39/136  bone]
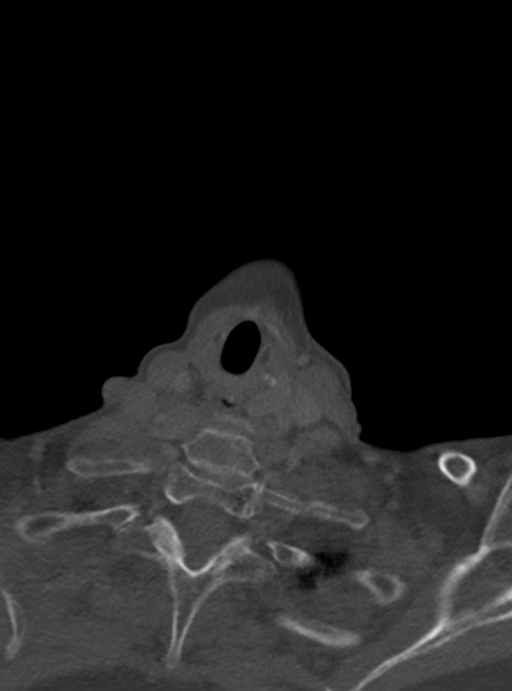
[im 78/136  bone]
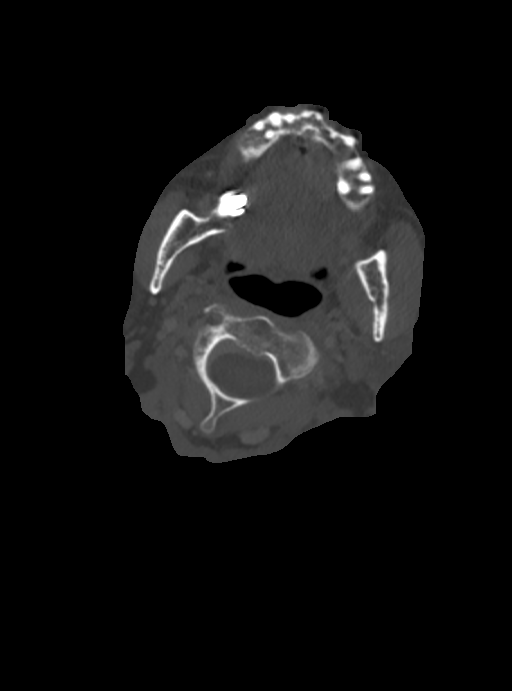
[im 97/136  bone]
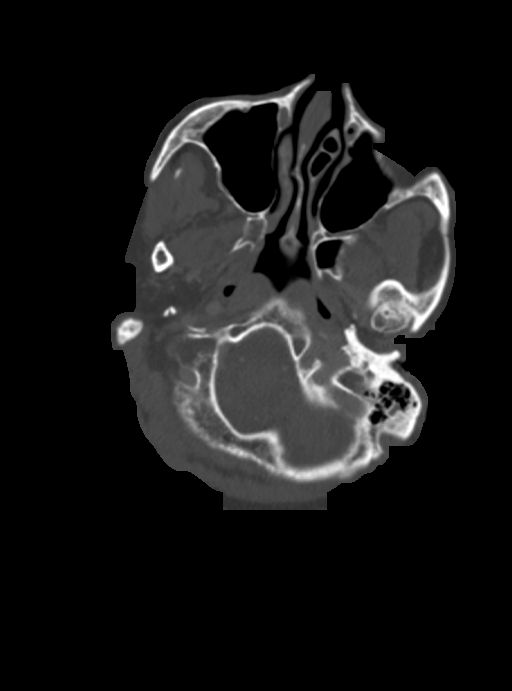
[im 116/136  soft-tissue]
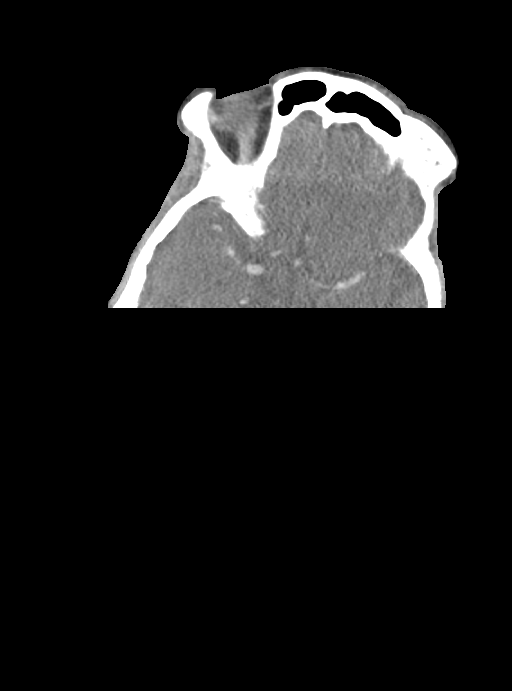
[im 116/136  bone]
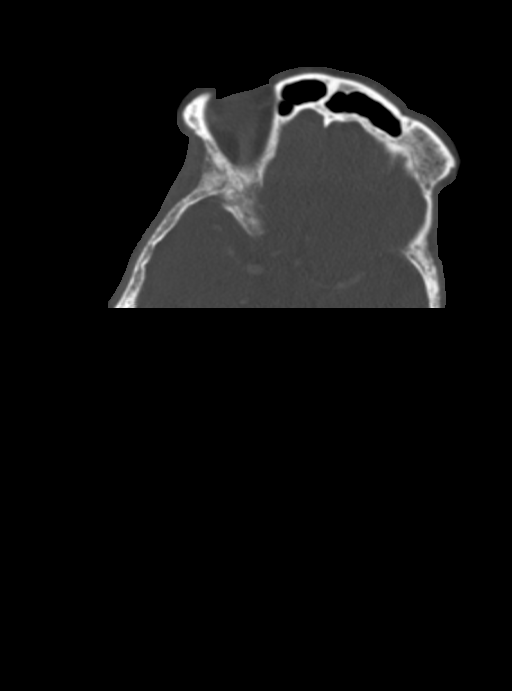

[Series 7: cor neck · coronal · 0.42mm/px · 3 of 121 slices shown]
[im 47/121  bone]
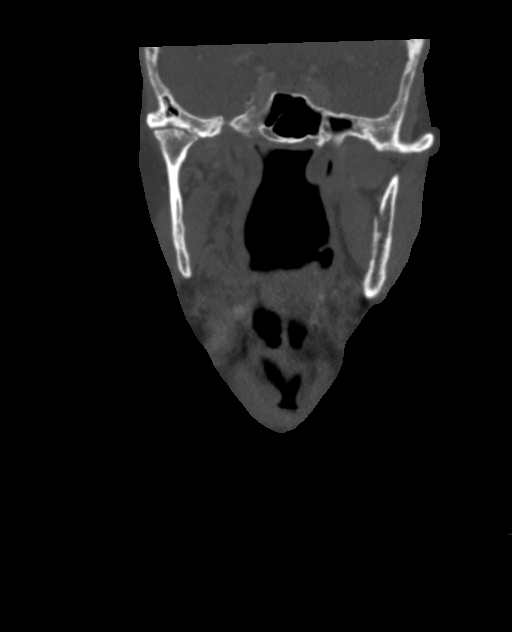
[im 56/121  bone]
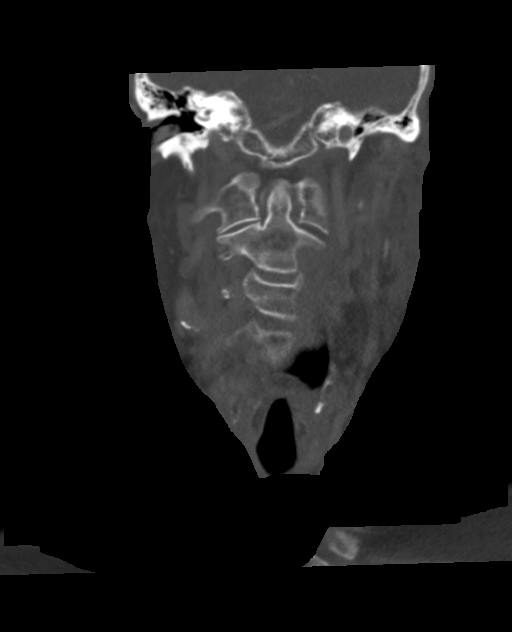
[im 65/121  bone]
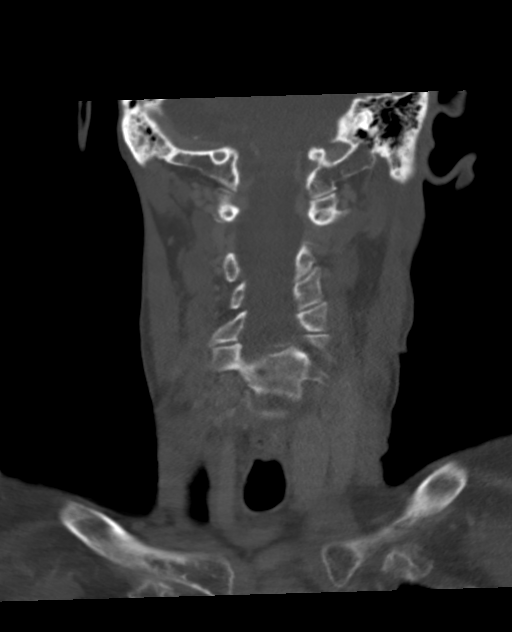

[Series 8: sag neck · sagittal · 0.49mm/px · 5 of 101 slices shown, 6 images]
[im 34/101  bone]
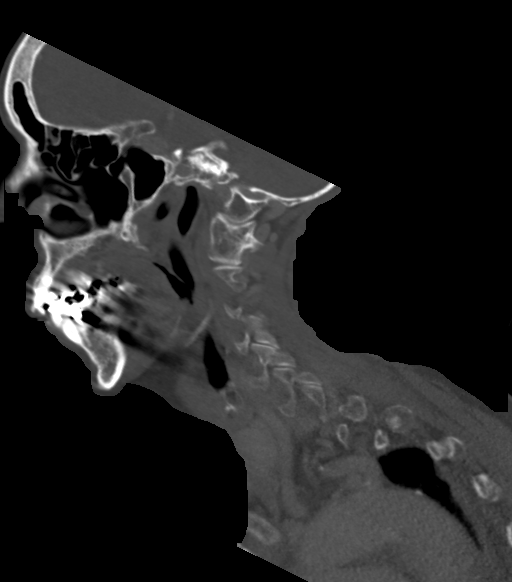
[im 42/101  bone]
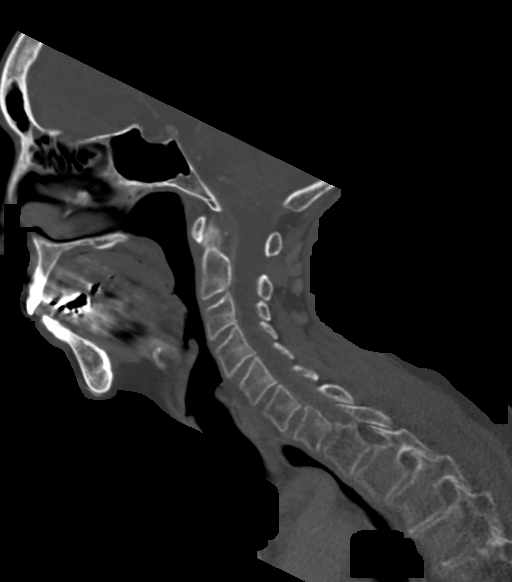
[im 51/101  soft-tissue]
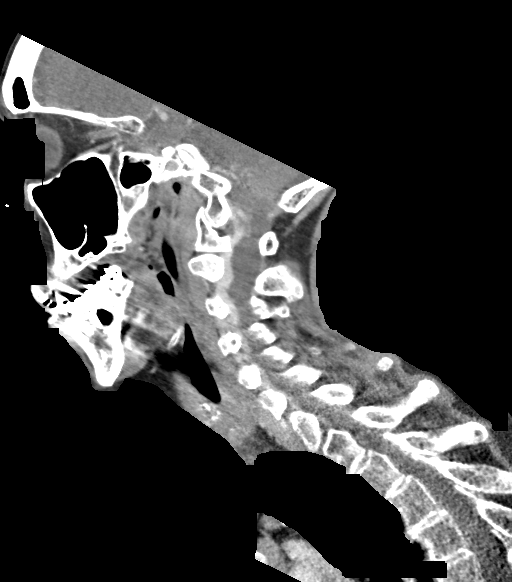
[im 51/101  bone]
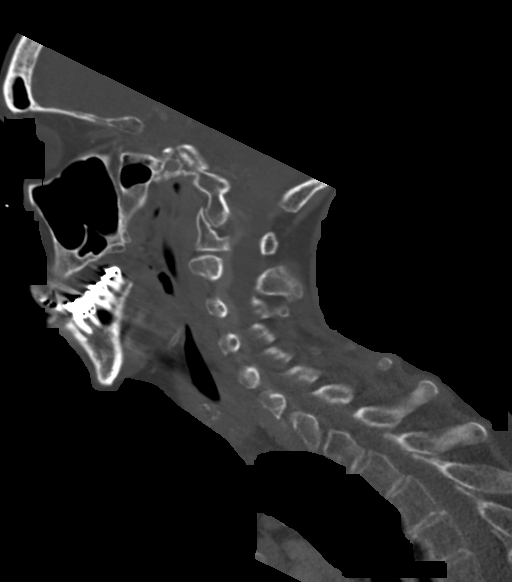
[im 59/101  bone]
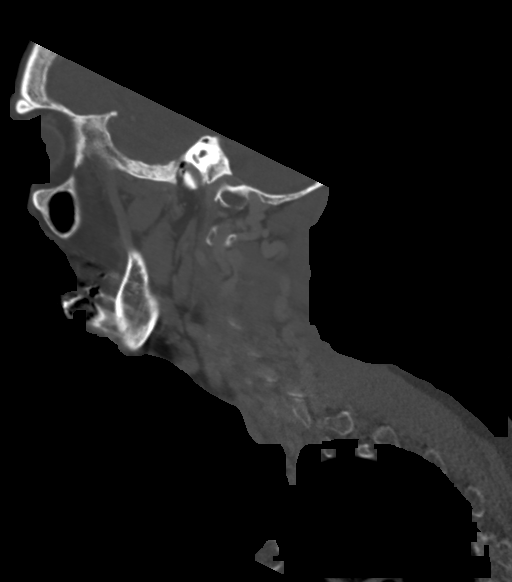
[im 67/101  bone]
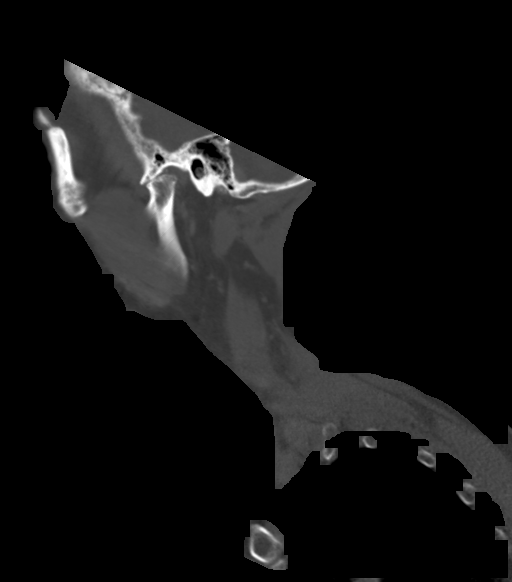

[13 of 33 positions shown; findings below may reference images not displayed]

FINDINGS: Artifact related to dental amalgam.

Pharynx and larynx: Unremarkable.  No mass or swelling.

Salivary glands: Unremarkable apart from fatty replacement of the
right parotid

Thyroid: Post right thyroidectomy.

Lymph nodes: No enlarged nodes identified.

Vascular: Major neck vessels are patent.

Limited intracranial: No abnormal enhancement.

Visualized orbits: Unremarkable.

Mastoids and visualized paranasal sinuses: Aerated.

Skeleton: Minor degenerative changes of the cervical spine.
Degenerative changes of the temporomandibular joints.

Upper chest: Dictated separately.

Other: None.
IMPRESSION: No evidence of metastatic disease in the chest.

## 2021-12-24 IMAGING — CT CT CHEST W/ CM
3 of 4 series · 16 of 36 positions shown, 18 images · IV contrast (omnipaque)
Comparison: Most recent CT chest 12/23/2018.  05/05/2013 PET-CT.

CLINICAL DATA: Primary Cancer Type: Lung
TECHNIQUE: Multidetector CT imaging of the chest was performed during
intravenous contrast administration.

CONTRAST:  75mL OMNIPAQUE IOHEXOL 300 MG/ML  SOLN

[Series 4: axial st · axial · 0.84mm/px · z∈[-286,-76]mm · 7 of 56 slices shown, 9 images]
[im 7/56  mediastinal]
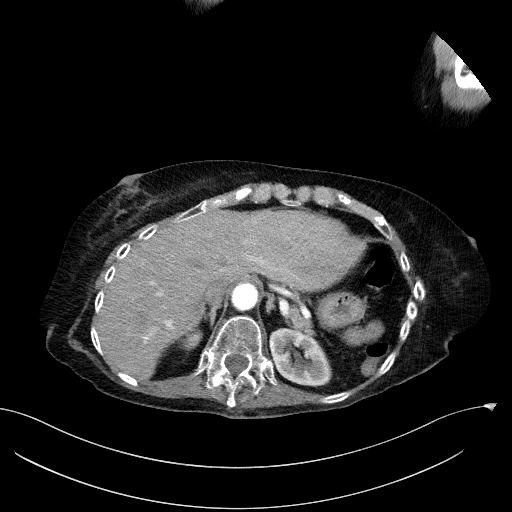
[im 7/56  lung]
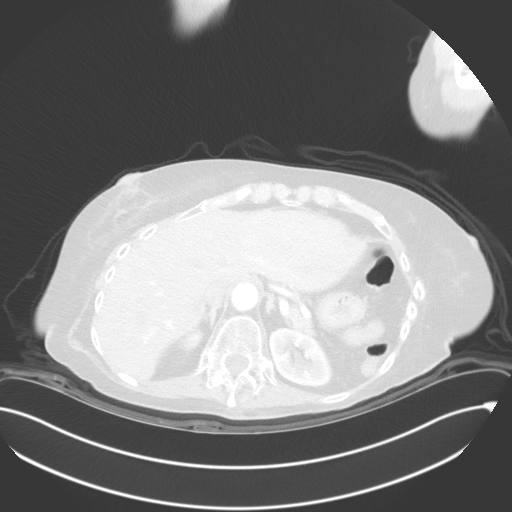
[im 14/56  lung]
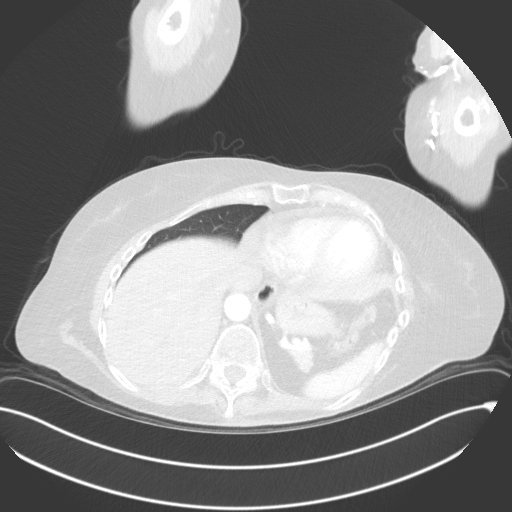
[im 21/56  lung]
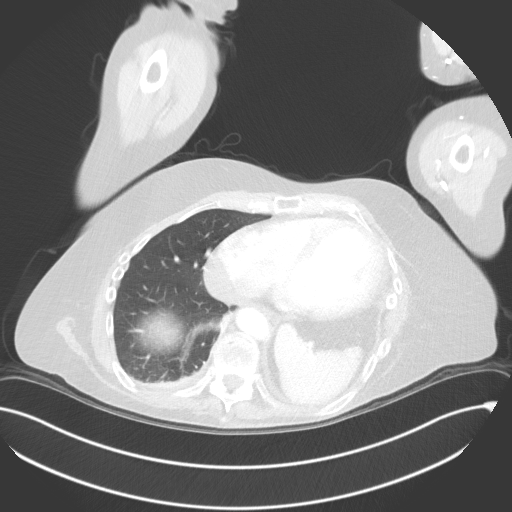
[im 28/56  lung]
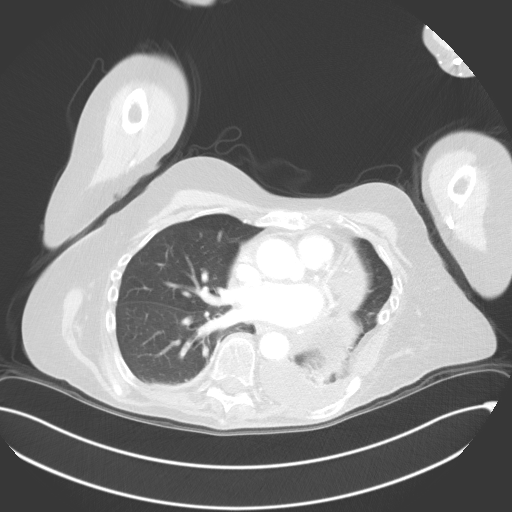
[im 35/56  mediastinal]
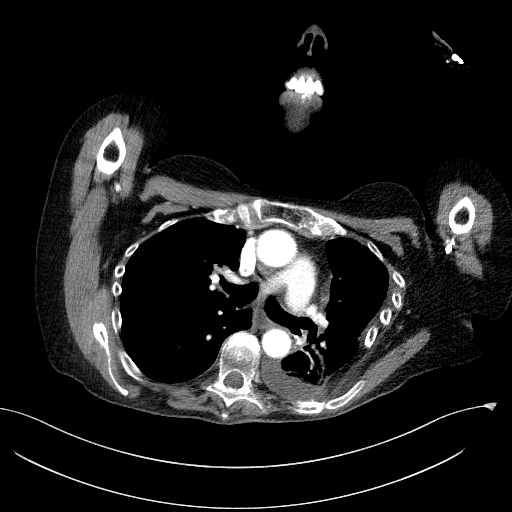
[im 35/56  lung]
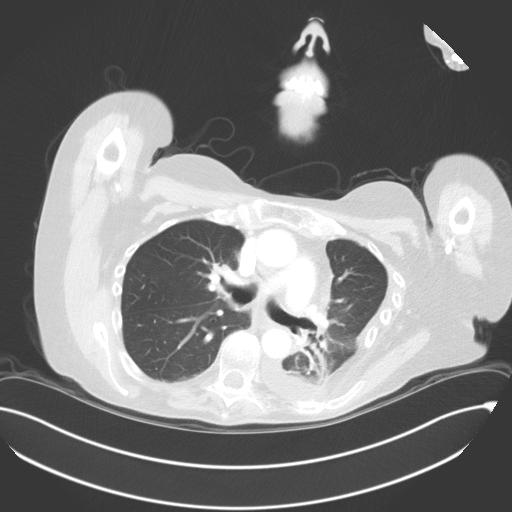
[im 42/56  lung]
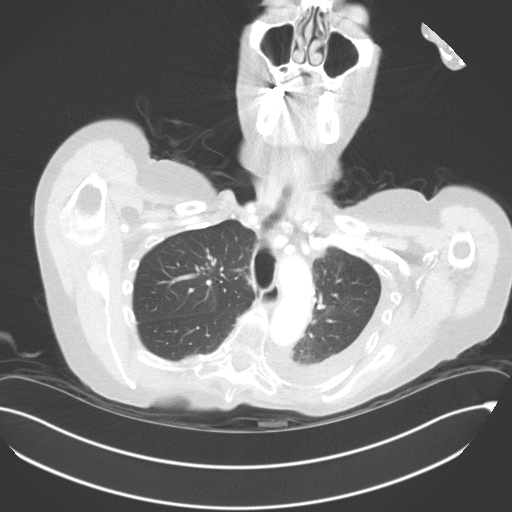
[im 49/56  lung]
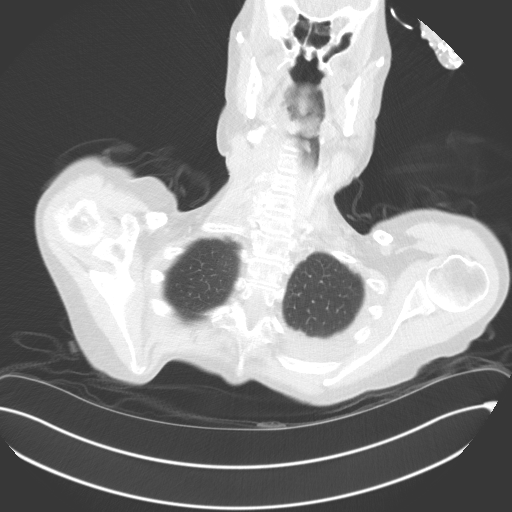

[Series 5: coronal · coronal · 0.59mm/px · 3 of 127 slices shown]
[im 26/127  lung]
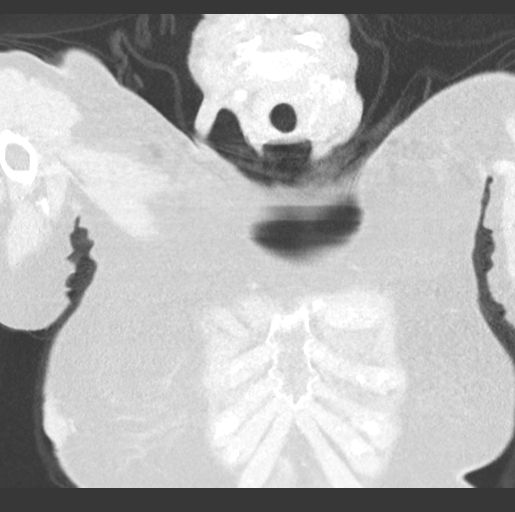
[im 51/127  lung]
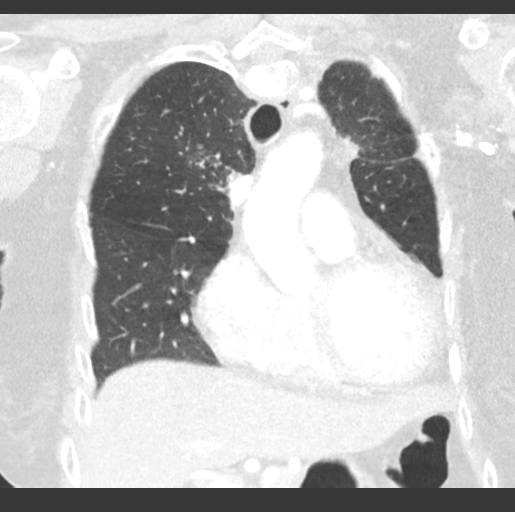
[im 76/127  lung]
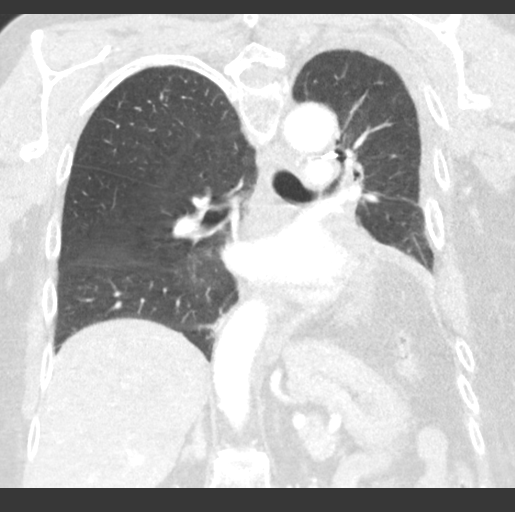

[Series 7: lungs · axial · 0.84mm/px · z∈[-255,-121]mm · 6 of 121 slices shown]
[im 14/121  lung]
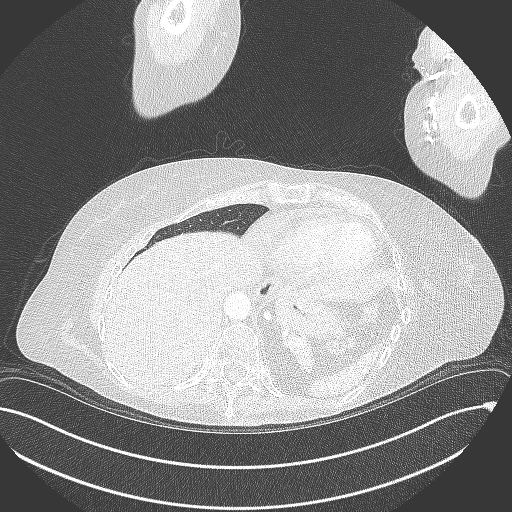
[im 27/121  lung]
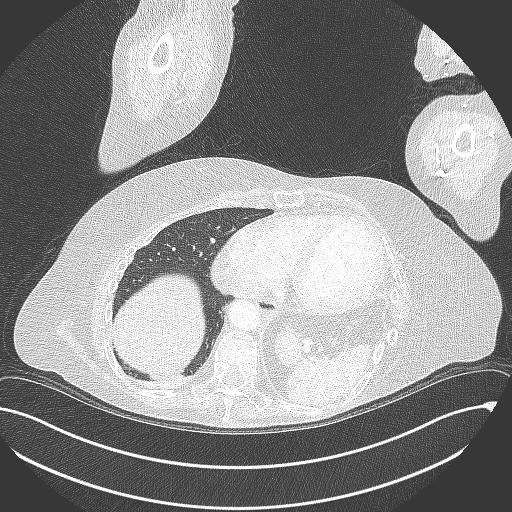
[im 41/121  lung]
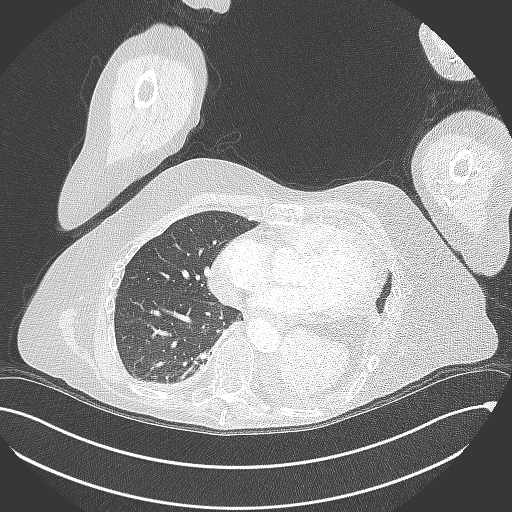
[im 54/121  lung]
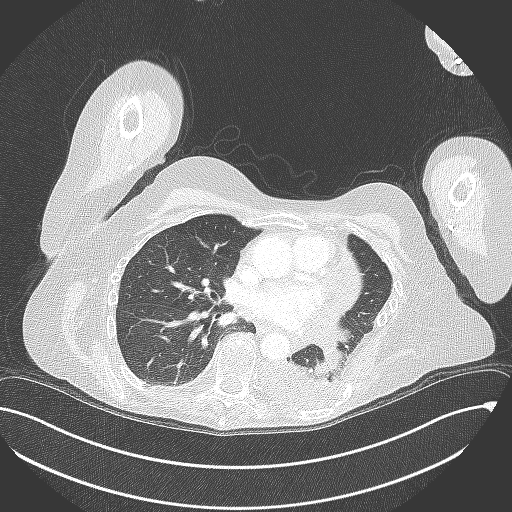
[im 67/121  lung]
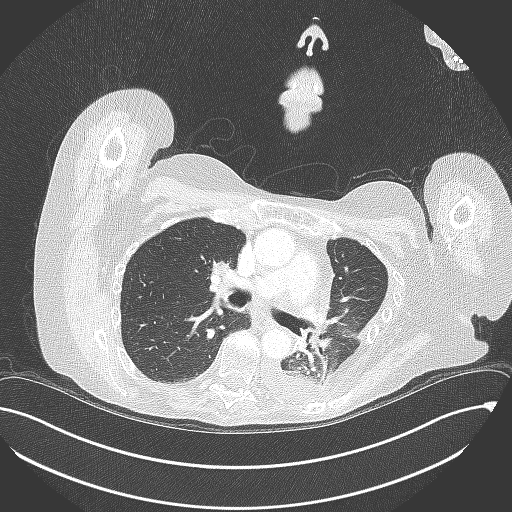
[im 81/121  lung]
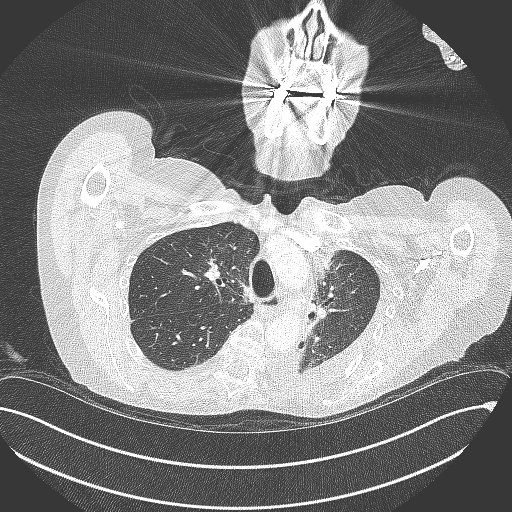

[16 of 36 positions shown; findings below may reference images not displayed]

Imaging Indication: Routine surveillance

Interval therapy since last imaging? Yes

Initial Cancer Diagnosis

Date: [DATE]  Established by: Biopsy-proven

Detailed Pathology: Non-small cell lung cancer, adenocarcinoma with
brain metastases initially diagnosed as a stage IIb

Primary Tumor location: Left lower lobe; currently right upper lobe

Surgeries: Left lower lobectomy [DATE]

Chemotherapy: Yes Ongoing? Yes Most recent administration:
11/25/2014-current

Radiation therapy? Yes Date Range: [DATE] Target:
Right lung

EXAM:
CT CHEST WITH CONTRAST
FINDINGS: Cardiovascular: Mild cardiomegaly. Small pericardial
effusion/thickening, slightly increased. Atherosclerotic
nonaneurysmal thoracic aorta. Normal caliber pulmonary arteries. No
central pulmonary emboli.

Mediastinum/Nodes: Right hemithyroidectomy. No discrete left thyroid
nodules. Unremarkable esophagus. No pathologically enlarged
axillary, mediastinal or hilar lymph nodes.

Lungs/Pleura: No pneumothorax. Status post left lower lobectomy.
Small dependent left pleural effusion and passive atelectasis in the
dependent left upper lower lobe, slightly increased. Sharply
marginated left perihilar radiation fibrosis is unchanged. No acute
consolidative airspace disease or lung masses. Irregular perihilar
1.7 x 1.1 cm right upper lobe pulmonary nodule (series 7/image 48),
previously 1.7 x 1.2 cm using similar measurement technique, stable.
Several new clustered small irregular nodules in the right upper
lobe peripheral to the perihilar nodule, largest 0.5 cm (series
7/image 25). Previously visualized 4 mm peripheral right upper lobe
(series 7/image 44) and vague 4 mm left upper lobe (series 7/image
62) nodules are stable. No additional new significant pulmonary
nodules.

Upper abdomen: No acute abnormality.

Musculoskeletal: No aggressive appearing focal osseous lesions.
Healed deformity in the sternum. Exaggerated thoracic kyphosis. Mild
to moderate T10 vertebral compression fracture and moderate to
severe L1 vertebral compression fracture, unchanged since 11/23/2019
CT abdomen study. Moderate thoracic spondylosis.
IMPRESSION: 1. Several new clustered small irregular nodules in the right upper
lobe peripheral to the stable dominant perihilar nodule, largest
cm. Findings are indeterminate for inflammatory nodules versus early
metastatic progression. Suggest attention on follow-up chest CT in 3
months. These nodules are below PET resolution.
2. Stable left perihilar radiation fibrosis.
3. Small dependent left pleural effusion, slightly increased.
4. Mild cardiomegaly. Small pericardial effusion/thickening,
slightly increased.
5. Stable chronic thoracolumbar vertebral compression fractures.
6. Aortic Atherosclerosis ([id number]).

## 2021-12-26 DIAGNOSIS — R197 Diarrhea, unspecified: Secondary | ICD-10-CM | POA: Diagnosis not present

## 2021-12-26 DIAGNOSIS — C3490 Malignant neoplasm of unspecified part of unspecified bronchus or lung: Secondary | ICD-10-CM | POA: Diagnosis not present

## 2021-12-26 DIAGNOSIS — I480 Paroxysmal atrial fibrillation: Secondary | ICD-10-CM | POA: Diagnosis not present

## 2021-12-26 DIAGNOSIS — M6281 Muscle weakness (generalized): Secondary | ICD-10-CM | POA: Diagnosis not present

## 2021-12-26 DIAGNOSIS — I5042 Chronic combined systolic (congestive) and diastolic (congestive) heart failure: Secondary | ICD-10-CM | POA: Diagnosis not present

## 2022-01-03 ENCOUNTER — Emergency Department (HOSPITAL_COMMUNITY): Payer: Medicare PPO

## 2022-01-03 ENCOUNTER — Encounter (HOSPITAL_COMMUNITY): Payer: Self-pay | Admitting: Radiology

## 2022-01-03 ENCOUNTER — Emergency Department (HOSPITAL_COMMUNITY)
Admission: EM | Admit: 2022-01-03 | Discharge: 2022-01-03 | Disposition: A | Discharge status: Other Acute Inpatient Hospital | Payer: Medicare PPO | Attending: Emergency Medicine | Admitting: Emergency Medicine

## 2022-01-03 DIAGNOSIS — K603 Anal fistula: Secondary | ICD-10-CM | POA: Diagnosis not present

## 2022-01-03 DIAGNOSIS — N3289 Other specified disorders of bladder: Secondary | ICD-10-CM | POA: Diagnosis not present

## 2022-01-03 DIAGNOSIS — N210 Calculus in bladder: Secondary | ICD-10-CM | POA: Diagnosis not present

## 2022-01-03 DIAGNOSIS — R531 Weakness: Secondary | ICD-10-CM | POA: Diagnosis not present

## 2022-01-03 DIAGNOSIS — R4182 Altered mental status, unspecified: Secondary | ICD-10-CM | POA: Diagnosis not present

## 2022-01-03 DIAGNOSIS — F0390 Unspecified dementia without behavioral disturbance: Secondary | ICD-10-CM | POA: Insufficient documentation

## 2022-01-03 DIAGNOSIS — K644 Residual hemorrhoidal skin tags: Secondary | ICD-10-CM | POA: Diagnosis not present

## 2022-01-03 DIAGNOSIS — Z7401 Bed confinement status: Secondary | ICD-10-CM | POA: Diagnosis not present

## 2022-01-03 DIAGNOSIS — N3000 Acute cystitis without hematuria: Secondary | ICD-10-CM | POA: Insufficient documentation

## 2022-01-03 DIAGNOSIS — K6289 Other specified diseases of anus and rectum: Secondary | ICD-10-CM | POA: Diagnosis present

## 2022-01-03 DIAGNOSIS — K5641 Fecal impaction: Secondary | ICD-10-CM | POA: Diagnosis not present

## 2022-01-03 DIAGNOSIS — Z85118 Personal history of other malignant neoplasm of bronchus and lung: Secondary | ICD-10-CM | POA: Insufficient documentation

## 2022-01-03 DIAGNOSIS — R770 Abnormality of albumin: Secondary | ICD-10-CM | POA: Insufficient documentation

## 2022-01-03 DIAGNOSIS — R7309 Other abnormal glucose: Secondary | ICD-10-CM | POA: Diagnosis not present

## 2022-01-03 LAB — URINALYSIS, ROUTINE W REFLEX MICROSCOPIC
Bilirubin Urine: NEGATIVE
Glucose, UA: NEGATIVE mg/dL
Ketones, ur: NEGATIVE mg/dL
Nitrite: NEGATIVE
Protein, ur: NEGATIVE mg/dL
Specific Gravity, Urine: 1.009 (ref 1.005–1.030)
WBC, UA: >50 WBC/hpf — ABNORMAL HIGH (ref 0–5)
pH: 5 (ref 5.0–8.0)

## 2022-01-03 LAB — CBC WITH DIFFERENTIAL/PLATELET
Abs Immature Granulocytes: 0.04 10*3/uL (ref 0.00–0.07)
Basophils Absolute: 0 10*3/uL (ref 0.0–0.1)
Basophils Relative: 0 %
Eosinophils Absolute: 0.2 10*3/uL (ref 0.0–0.5)
Eosinophils Relative: 2 %
HCT: 39.2 % (ref 36.0–46.0)
Hemoglobin: 12.1 g/dL (ref 12.0–15.0)
Immature Granulocytes: 1 %
Lymphocytes Relative: 13 %
Lymphs Abs: 1.1 10*3/uL (ref 0.7–4.0)
MCH: 28.3 pg (ref 26.0–34.0)
MCHC: 30.9 g/dL (ref 30.0–36.0)
MCV: 91.8 fL (ref 80.0–100.0)
Monocytes Absolute: 0.7 10*3/uL (ref 0.1–1.0)
Monocytes Relative: 9 %
Neutro Abs: 6.1 10*3/uL (ref 1.7–7.7)
Neutrophils Relative %: 75 %
Platelets: 331 10*3/uL (ref 150–400)
RBC: 4.27 MIL/uL (ref 3.87–5.11)
RDW: 14.6 % (ref 11.5–15.5)
WBC: 8.2 10*3/uL (ref 4.0–10.5)
nRBC: 0 % (ref 0.0–0.2)

## 2022-01-03 LAB — COMPREHENSIVE METABOLIC PANEL
ALT: 12 U/L (ref 0–44)
AST: 11 U/L — ABNORMAL LOW (ref 15–41)
Albumin: 3.4 g/dL — ABNORMAL LOW (ref 3.5–5.0)
Alkaline Phosphatase: 88 U/L (ref 38–126)
Anion gap: 7 (ref 5–15)
BUN: 20 mg/dL (ref 8–23)
CO2: 25 mmol/L (ref 22–32)
Calcium: 9.3 mg/dL (ref 8.9–10.3)
Chloride: 108 mmol/L (ref 98–111)
Creatinine, Ser: 0.82 mg/dL (ref 0.44–1.00)
GFR, Estimated: >60 mL/min (ref >60)
Glucose, Bld: 105 mg/dL — ABNORMAL HIGH (ref 70–99)
Potassium: 3.8 mmol/L (ref 3.5–5.1)
Sodium: 140 mmol/L (ref 135–145)
Total Bilirubin: 0.7 mg/dL (ref 0.3–1.2)
Total Protein: 7 g/dL (ref 6.5–8.1)

## 2022-01-03 LAB — POC OCCULT BLOOD, ED: Fecal Occult Bld: NEGATIVE

## 2022-01-03 MED ORDER — CEPHALEXIN 500 MG PO CAPS: 500.0000 mg | ORAL_CAPSULE | Freq: Two times a day (BID) | ORAL | 0 refills | Status: DC

## 2022-01-03 MED ORDER — IOHEXOL 300 MG/ML  SOLN
75.0000 mL | Freq: Once | INTRAMUSCULAR | Status: AC | PRN
  Administered 2022-01-03: 75 mL via INTRAVENOUS

## 2022-01-03 MED ORDER — SODIUM CHLORIDE (PF) 0.9 % IJ SOLN
INTRAMUSCULAR | Status: AC
  Filled 2022-01-03: qty 50

## 2022-01-03 MED ORDER — SODIUM CHLORIDE 0.9 % IV SOLN
1.0000 g | Freq: Once | INTRAVENOUS | Status: AC
  Administered 2022-01-03: 1 g via INTRAVENOUS
  Filled 2022-01-03: qty 10

## 2022-01-03 NOTE — ED Triage Notes (Signed)
Pt BIBA from [facility] c/o intermittent tailbone pain x2 weeks. No falls, no identifiable triggers. Seen by facility physician yesterday, no scans done. States they are supposed to be getting her pain meds but have not arrived yet. Denies neuropathy or neuralgia in legs. Skin intact. Recently tx for UTI, endorses frequency but no other sx. ? ?132/88 ?HR 82 ?95% RA ?

## 2022-01-03 NOTE — ED Provider Notes (Addendum)
[place] DEPT Provider Note   CSN: [id number] Arrival date & time: 01/03/22  0735     History Chief Complaint  Patient presents with   Tailbone Pain    Valerie [name] is a 76 y.o. female with h/o dementia, lung cancer, anal fistula, TIA presents to the ED for evaluation of rectal pain for the past 2 weeks intermittently. She denies any diarrhea or constipation and reports she usually has around 6 episodes of BM a day that has been consistent for years. Denies any watery stools. She reports some occasional dysuria and urinary frequency for the past 2 weeks as well. She denies any abdominal pain, nausea, vomiting, fevers, hematuria, melena, hematochezia, chest pain, SOB, or rectal bleeding.   HPI     Home Medications Prior to Admission medications   Medication Sig Start Date End Date Taking? Authorizing Provider  ACETAMINOPHEN EXTRA STRENGTH 500 MG tablet Take 1,000 mg by mouth every 8 (eight) hours as needed for moderate pain. 08/28/21  Yes [provider]  Ascorbic Acid (VITAMIN C) 100 MG tablet Take 100 mg by mouth every evening. Gummie   Yes [provider]  donepezil (ARICEPT) 10 MG tablet TAKE 1 TABLET BY MOUTH ONCE DAILY IN THE EVENING Patient taking differently: Take 10 mg by mouth every evening. 04/03/21  Yes [name], MD  feeding supplement (ENSURE ENLIVE / ENSURE PLUS) LIQD Take 237 mLs by mouth 2 (two) times daily between meals. 10/04/21  Yes Shelly Coss, MD  Hydrocortisone, Perianal, 1 % CREA Apply 1 g topically 2 (two) times daily. 01/02/22  Yes [provider]  loperamide (IMODIUM) 2 MG capsule Take 2 capsules (4 mg total) by mouth every 8 (eight) hours as needed (diarrhea). 10/04/21  Yes Shelly Coss, MD  Magnesium 200 MG TABS Take 200 mg by mouth daily.   Yes [provider]  Multiple Vitamin (MULTI-VITAMINS) TABS Take 1 tablet by mouth every evening.    Yes [provider]   ondansetron (ZOFRAN) 4 MG tablet Take 1 tablet (4 mg total) by mouth every 8 (eight) hours as needed for nausea or vomiting. 10/04/21  Yes Shelly Coss, MD  PREPARATION H 0.25-14-74.9 % rectal ointment Place 1 application. rectally 2 (two) times daily as needed for hemorrhoids. 10/27/21  Yes [provider]  prochlorperazine (COMPAZINE) 10 MG tablet Take 1 tablet (10 mg total) by mouth every 6 (six) hours as needed for nausea or vomiting. 05/30/20  Yes Curt Bears, MD  saccharomyces boulardii (FLORASTOR) 250 MG capsule Take 1 capsule (250 mg total) by mouth 2 (two) times daily. 10/04/21  Yes Shelly Coss, MD  vitamin B-12 (CYANOCOBALAMIN) 1000 MCG tablet Take 1,000 mcg by mouth every evening.   Yes [provider]  vitamin C (ASCORBIC ACID) 250 MG tablet Take 250 mg by mouth daily. 11/09/21  Yes [provider]  Zinc Oxide (DESITIN) 13 % CREA Apply 1 application topically 2 (two) times daily. Patient taking differently: Apply 1 application. topically 2 (two) times daily. To groin area 09/20/21  Yes Drenda Freeze, MD  cephALEXin (KEFLEX) 500 MG capsule Take 1 capsule (500 mg total) by mouth 2 (two) times daily. 01/03/22   Sherrell Puller, PA-C  hyoscyamine (LEVSIN) 0.125 MG tablet SMARTSIG:1 Tablet(s) By Mouth Every 12 Hours Patient not taking: Reported on 01/03/2022 01/02/22   [provider]  NON FORMULARY Apply 1 application topically every 4 (four) hours as needed (for rash to buttocks).    [provider]  Allergies    Broccoli [brassica oleracea], Fruit extracts, and Sulfa antibiotics    Review of Systems   Review of Systems  Constitutional:  Negative for chills and fever.  Respiratory:  Negative for shortness of breath.   Cardiovascular:  Negative for chest pain.  Gastrointestinal:  Positive for rectal pain. Negative for abdominal pain, anal bleeding, blood in stool, constipation, diarrhea, nausea and vomiting.  Genitourinary:   Positive for dysuria and frequency.   Physical Exam Updated Vital Signs BP (!) 126/98   Pulse 98   Temp 98.7 F (37.1 C) (Oral)   Resp 17   SpO2 100%  Physical Exam Vitals and nursing note reviewed. Exam conducted with a chaperone present ([employer]'- RN).  Constitutional:      General: She is not in acute distress.    Appearance: She is not toxic-appearing.     Comments: Chronically ill appearing patient in no acute distress.   HENT:     Head: Normocephalic and atraumatic.     Mouth/Throat:     Mouth: Mucous membranes are moist.  Eyes:     General: No scleral icterus. Cardiovascular:     Rate and Rhythm: Normal rate and regular rhythm.     Pulses: Normal pulses.  Pulmonary:     Effort: Pulmonary effort is normal. No respiratory distress.     Breath sounds: Normal breath sounds.  Abdominal:     General: Bowel sounds are normal.     Palpations: Abdomen is soft.     Tenderness: There is no abdominal tenderness. There is no guarding or rebound.  Genitourinary:    Comments: Hemeoccult negative she has 2 external soft hemorrhoids and the 6 o'clock position. Clay colored stool. On exam, the patient does feel to have a large stool burden. No fissures or bleeding noted. The patient does have a soft, fatty area on her left buttock. No overlying skin changes or warmth. No induration or fluctuance to the area.  Musculoskeletal:        General: No deformity.     Cervical back: Normal range of motion.  Skin:    General: Skin is warm and dry.  Neurological:     General: No focal deficit present.     Mental Status: She is alert. Mental status is at baseline.    ED Results / Procedures / Treatments   Labs (all labs ordered are listed, but only abnormal results are displayed) Labs Reviewed  COMPREHENSIVE METABOLIC PANEL - Abnormal; Notable for the following components:      Result Value   Glucose, Bld 105 (*)    Albumin 3.4 (*)    AST 11 (*)    All other components within normal limits   URINALYSIS, ROUTINE W REFLEX MICROSCOPIC - Abnormal; Notable for the following components:   APPearance CLOUDY (*)    Hgb urine dipstick SMALL (*)    Leukocytes,Ua LARGE (*)    WBC, UA >50 (*)    Bacteria, UA MANY (*)    All other components within normal limits  CBC WITH DIFFERENTIAL/PLATELET  POC OCCULT BLOOD, ED    EKG None  Radiology CT PELVIS W CONTRAST  Result Date: 01/03/2022 CLINICAL DATA:  Rectal pain and diarrhea for 2 weeks. Palpable abnormality in left perianal region. Prior anal fistula. Metastatic lung carcinoma. EXAM: CT PELVIS WITH CONTRAST TECHNIQUE: Multidetector CT imaging of the pelvis was performed using the standard protocol following the bolus administration of intravenous contrast. RADIATION DOSE REDUCTION: This exam was performed according to the  departmental dose-optimization program which includes automated exposure control, adjustment of the mA and/or kV according to patient size and/or use of iterative reconstruction technique. CONTRAST:  55mL OMNIPAQUE IOHEXOL 300 MG/ML  SOLN COMPARISON:  09/20/2021 FINDINGS: Lower Urinary Tract: Diffuse bladder wall thickening and trabeculation again noted as well as dilatation of the distal ureters, right side greater than left. A tiny less than 5 mm bladder calculus is noted. Bowel: Large amount of stool is seen in the rectum, and fecal impaction cannot be excluded. No evidence of bowel wall thickening or abnormal fluid collections. A linear soft tissue density is seen in the medial left buttock adjacent to the gluteal crease, extending inferiorly from the anus. Unchanged compared to prior exam and is consistent with a perianal fistula. There is no evidence of perianal abscess or soft tissue mass. Vascular/Lymphatic: No pathologically enlarged lymph nodes or other significant abnormality. Reproductive: Partially calcified posterior uterine fibroid is again seen measuring 3 cm in diameter. No adnexal masses are identified. Other:  None. Musculoskeletal: No suspicious bone lesions identified. Old L4 vertebral body compression fracture is again noted. IMPRESSION: Stable linear soft tissue density in medial left buttock , consistent with a perianal fistula. No evidence of perianal abscess, soft tissue mass, or other acute findings. Large amount of stool in rectum; fecal impaction cannot be excluded. Stable diffuse bladder wall thickening and trabeculation, likely due to neurogenic bladder. Tiny less than 5 mm bladder calculus again noted. Stable small uterine fibroid. Electronically Signed   By: Marlaine Hind M.D.   On: 01/03/2022 10:23    Procedures Fecal disimpaction  Date/Time: 01/03/2022 2:44 PM Performed by: Sherrell Puller, PA-C Authorized by: Sherrell Puller, PA-C  Consent: Verbal consent obtained. Risks and benefits: risks, benefits and alternatives were discussed Consent given by: patient Patient understanding: patient states understanding of the procedure being performed Imaging studies: imaging studies available Patient identity confirmed: verbally with patient and arm band    Medications Ordered in ED Medications  cefTRIAXone (ROCEPHIN) 1 g in sodium chloride 0.9 % 100 mL IVPB (0 g Intravenous Stopped 01/03/22 1052)  iohexol (OMNIPAQUE) 300 MG/ML solution 75 mL (75 mLs Intravenous Contrast Given 01/03/22 0947)  sodium chloride (PF) 0.9 % injection (  Given by Other 01/03/22 1023)    ED Course/ Medical Decision Making/ A&P                           Medical Decision Making Amount and/or Complexity of Data Reviewed Labs: ordered. Radiology: ordered.  Risk Prescription drug management.    76 y/o F presents to the ED for evaluation of rectal pain and dysuria/urinary frequency. Differential diagnosis includes but is not limited to fistula, abscess, fissure, hemorrhoids, UTI, constipation, overactive bladder, neurogenic bladder. Vital signs are unremarkable. She is normotensive, afebrile, normal heart rate,  satting well on room air without any increase work of breathing. Physical exam is significant for chronically ill-appearing patient in no acute distress.  Abdomen is soft and nontender with normal active bowel sounds. Hemeoccult negative she has 2 external soft hemorrhoids and the 6 o'clock position. Clay colored stool. On exam, the patient does feel to have a large stool burden. No fissures or bleeding noted. The patient does have a soft, fatty area on her left buttock. No overlying skin changes or warmth. No induration or fluctuance to the area.   I do not think the patient is having true 6 bowel movements/day as the small smear seen in her  brief she called a bowel movement.  Given the patient's history of fistula and cancer, will scan to rule out any mass, fracture, or abscess.   I independently reviewed and interpreted the patient's labs.  CBC is without leukocytosis or anemia.  CMP shows mildly elevated glucose at 105 with decrease in albumin and AST at 3.4 and 11 respectively.  Urinalysis shows cloudy urine with small amount of hemoglobin however there is large leukocytes with greater than 50 white blood cells and many bacteria.  Fecal occult negative.   CT imaging shows stable linear soft tissue density in medial left buttock , consistent with a perianal fistula. No evidence of perianal abscess, soft tissue mass, or other acute findings. Large amount of stool in rectum; fecal impaction cannot be excluded. Stable diffuse bladder wall thickening and trabeculation, likely due to neurogenic bladder. Tiny less than 5 mm bladder calculus again noted. Stable small uterine fibroid.  Spoke with Dr. Kris Hartmann with radiology who says that the fistula was present on the 09/20/21 scan, it just wasn't mentioned. He reports it is stable in appearance. Unsure if it is scarring from an old fistula or if it is an active one.   The patient has a history of a fistula and a normal white count. There is not overlying  erythema, induration, or fluctuance to the area. I think the patient's pain is likely due to her impaction.   Ordered Rocephin IV while in the ER for her UTI symptoms. Will send her home with Keflex BID for the next 7 days given her recent urine culture in February 2023. Called her nursing home who would like the prescription sent into [facility]. Prescription sent.   With nursing at bedside, we performed a fecal disimpaction and were able to remove a moderate amount of clay/brown colored stool. The patient reports she was having great relief of pain after this.   Given her stable imaging as well as reassuring labs and vitals, the patient is safe for discharge back to the nursing home. We discussed needing to wipe front to back to avoid UTIs. Strict return precautions and red flag symptoms discussed with the patient. Time was given for questions. The patient verbalized understanding and agrees to the plan. The patient is stable and being discharged home in good condition.   I discussed this case with my attending physician who cosigned this note including patient's presenting symptoms, physical exam, and planned diagnostics and interventions. Attending physician stated agreement with plan or made changes to plan which were implemented.   Attending physician assessed patient at bedside.   Final Clinical Impression(s) / ED Diagnoses Final diagnoses:  Acute cystitis without hematuria  Fecal impaction ([place])    Rx / DC Orders ED Discharge Orders          Ordered    cephALEXin (KEFLEX) 500 MG capsule  2 times daily,   Status:  Discontinued        01/03/22 1437    cephALEXin (KEFLEX) 500 MG capsule  2 times daily,   Status:  Discontinued        01/03/22 1442    cephALEXin (KEFLEX) 500 MG capsule  2 times daily        01/03/22 1458              Sherrell Puller, PA-C 01/03/22 1712    Sherrell Puller, PA-C 01/03/22 1713    Milton Ferguson, MD 01/08/22 [phone]

## 2022-01-03 NOTE — ED Notes (Addendum)
?  [facility] called for report, no answer.  ?

## 2022-01-03 NOTE — ED Notes (Signed)
Ptar here for pt.  ?

## 2022-01-03 NOTE — ED Notes (Signed)
Pt provided perineal care, new brief applied, new linens applied, pt repositioned to comfort. ?

## 2022-01-03 NOTE — Discharge Instructions (Addendum)
You were seen in there ER for evaluation of your rectal pain and urinary symptoms. Your lab work showed a urinary tract infection. We have given you some IV antibiotics, but you will need to be started on oral medications. I have sent in the prescription to [facility] pharmacy. Additionally your imaging was noted to have a large stool burden which is why we performed a disimpaction. I am glad you have having relief with your pain now. If you have any concern, new or worsening symptoms, please return to the ER.  ? ?Contact a health care provider if: ?Your symptoms do not get better after 1-2 days. ?Your symptoms go away and then return. ?Get help right away if: ?You have severe pain in your back or your lower abdomen. ?You have a fever or chills. ?You have nausea or vomiting. ?

## 2022-01-23 DIAGNOSIS — H6121 Impacted cerumen, right ear: Secondary | ICD-10-CM | POA: Diagnosis not present

## 2022-01-23 DIAGNOSIS — R1313 Dysphagia, pharyngeal phase: Secondary | ICD-10-CM | POA: Diagnosis not present

## 2022-02-09 IMAGING — CR DG CHEST 2V
2 series · 2 of 2 positions shown · non-contrast
Comparison: 06/16/2019

CLINICAL DATA: Shortness of breath.  Lung cancer.

EXAM:
CHEST - 2 VIEW

[w chest lat]
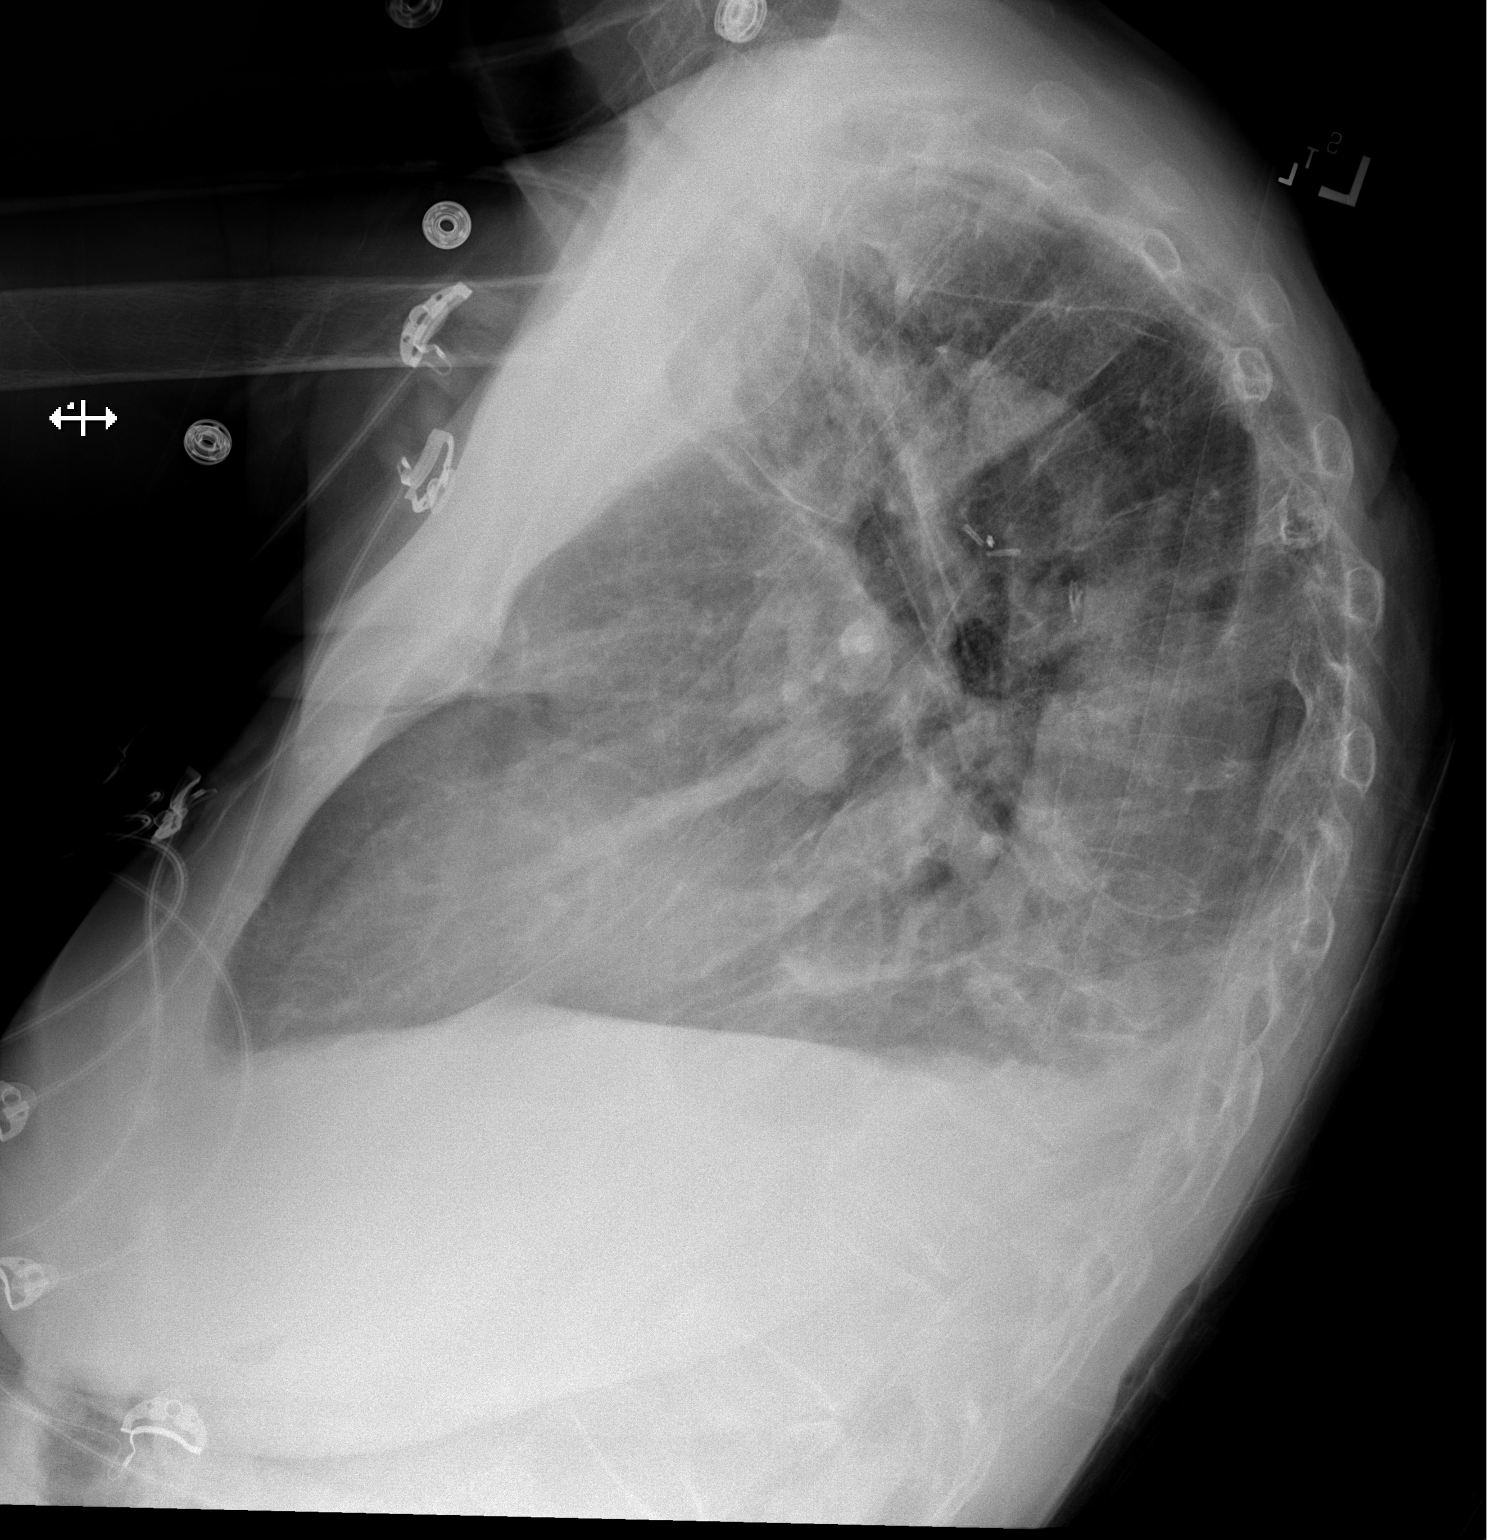

[x chest ap]
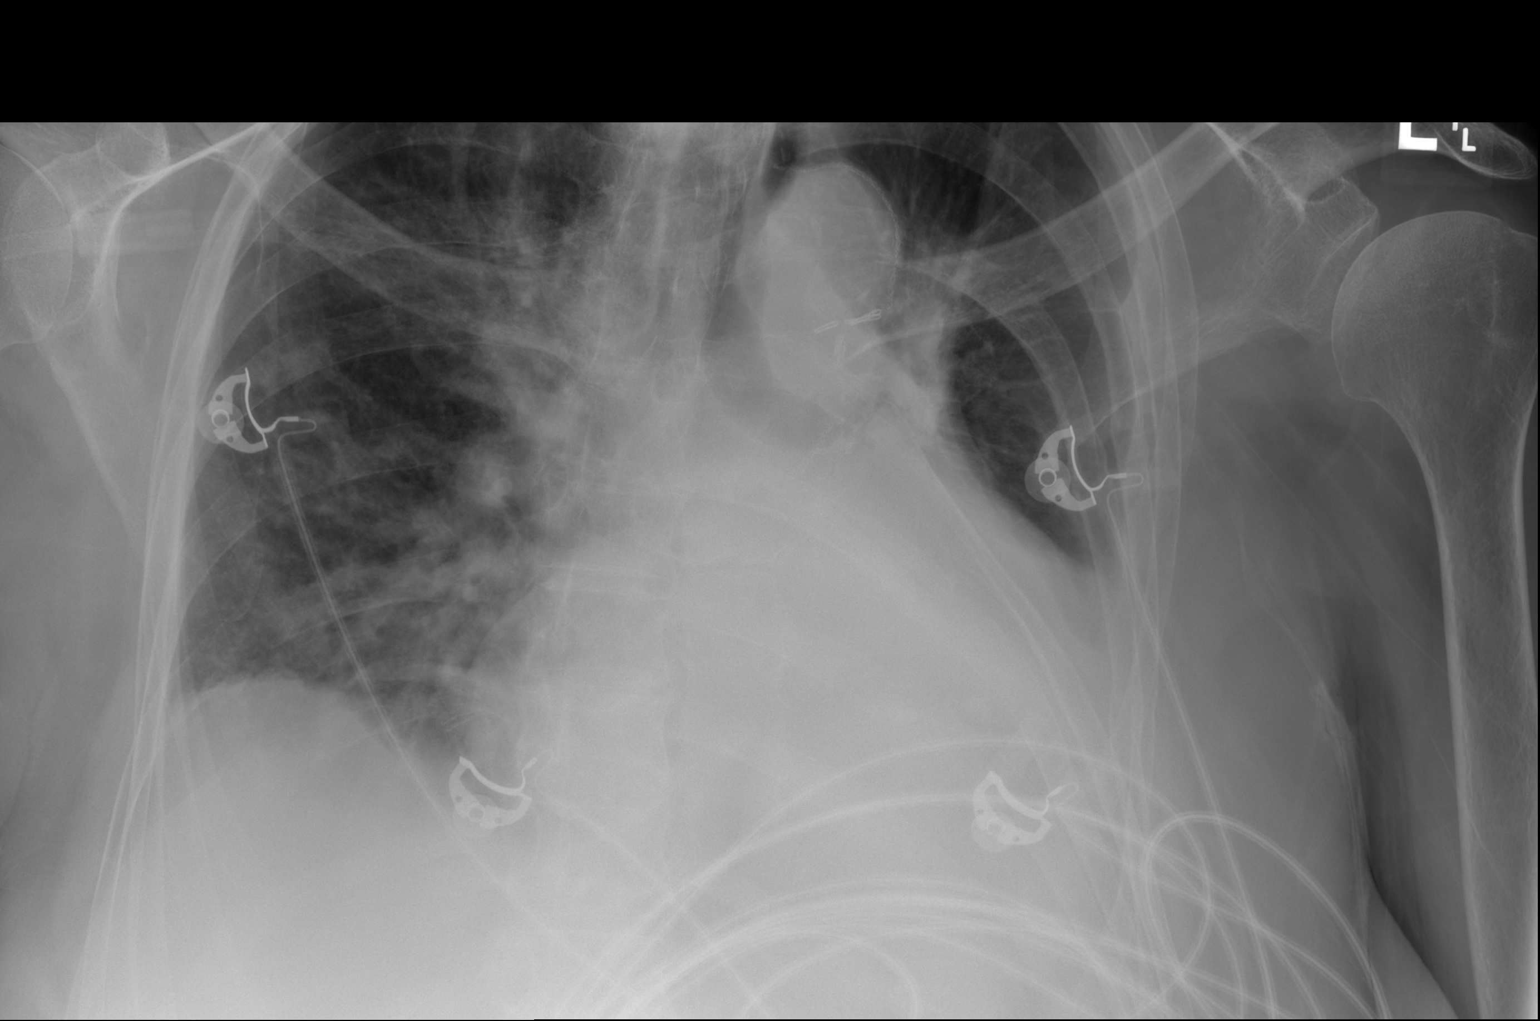

[2 of 2 positions shown; findings below may reference images not displayed]

FINDINGS: Cardiomegaly, vascular congestion. Small left pleural effusion with
left lower lobe atelectasis or infiltrate. Postoperative changes on
the left. No acute bony abnormality.
IMPRESSION: Cardiomegaly with vascular congestion.

Small left pleural effusion with left base atelectasis or
infiltrate.

## 2022-03-09 ENCOUNTER — Non-Acute Institutional Stay: Payer: Medicare PPO | Admitting: Hospice

## 2022-03-09 DIAGNOSIS — Z515 Encounter for palliative care: Secondary | ICD-10-CM

## 2022-03-09 DIAGNOSIS — F0150 Vascular dementia without behavioral disturbance: Secondary | ICD-10-CM

## 2022-03-09 DIAGNOSIS — R531 Weakness: Secondary | ICD-10-CM

## 2022-03-09 DIAGNOSIS — C799 Secondary malignant neoplasm of unspecified site: Secondary | ICD-10-CM

## 2022-03-09 NOTE — Progress Notes (Signed)
Therapist, nutritional Palliative Care Consult Note Telephone: [phone]  Fax: [phone]  PATIENT NAME: Valerie [name] [facility] [address] [place] [address] [phone] (home) [phone] (work) DOB: 02-May-1946 MRN: [id number]  PRIMARY CARE PROVIDER:    Pcp, No,  No address on file None  REFERRING PROVIDER:   Dr Florentina Jenny  RESPONSIBLE PARTY:   Sinclair Grooms - daughter Vernell Barrier - friend [place] - Fri  Contact Information     Name Relation Home Work [place] Daughter [phone]  [phone]   [facility] Daughter [phone]  [phone]   Derry Lory Sister [phone]  [phone]        I met face to face with patient at facility. Visit to build trust and highlight Palliative Medicine as specialized medical care for people living with serious illness, aimed at facilitating better quality of life through symptoms relief, assisting with advance care planning and complex medical decision making. Valerie [name]  is with patient during visit.   ASSESSMENT AND / RECOMMENDATIONS:   CODE STATUS: Patient is a Do Not Resuscitate.   Goals of Care: Goals include to maximize quality of life and symptom management. She also said that family will be interested in hospice service in the future.  Visit consisted of counseling and education dealing with the complex and emotionally intense issues of symptom management and palliative care in the setting of serious and potentially life-threatening illness. Palliative care team will continue to support patient, patient's family, and medical team.  Symptom Management/Plan: Dementia: Incontinent of bowel and bladder, memory loss/confusion.  Fast 6D.  Fall and safety precautions. Encourage reminiscence, word search/puzzles, cueing for recollection.  Continue Aricept as ordered.  Optimize ongoing supportive care. Metastatic lung CA: No more chemo/radiation.  Continue ongoing supportive  care Weakness: PT/OT in process; self propels in wheelchair, max assist for transfers, stand by hoyer lift.  Follow up: Palliative care will continue to follow for complex medical decision making, advance care planning, and clarification of goals. Return 6 weeks or prn. Encouraged to call provider sooner with any concerns.   Family /Caregiver/Community Supports: Patient in assisted living for ongoing care.  Patient was a retired Education officer, museum and her spouse who is late was a retired Development worker, community.  HOSPICE ELIGIBILITY/DIAGNOSIS: TBD  Chief Complaint: Follow-up visit  HISTORY OF PRESENT ILLNESS:  Valerie [name] is a 76 y.o. 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  Patient continues to decline in cognitive and functional status, no longer able to ambulate, max assist with transfers. History obtained from review of EMR, discussion with primary team, caregiver, family and/or Ms. Mates.  Review and summarization of Epic records shows history from other than patient. Rest of 10 point ROS asked and negative.  I reviewed as needed, available labs, patient records, imaging, studies and related documents from the EMR  PAST MEDICAL HISTORY:  Active Ambulatory Problems    Diagnosis Date Noted   Primary cancer of right upper lobe of lung (HCC) 04/22/2009   Malignant neoplasm of brain (HCC) 04/22/2009   VITAMIN D DEFICIENCY 04/22/2009   ANAL FISTULA 04/22/2009   LESION, ANUS 04/26/2009   OSTEOPOROSIS 04/22/2009   DIARRHEA 04/26/2009   Lung metastasis    Closed left ankle fracture 10/01/2014   Right fibular fracture 10/01/2014   Ankle fracture 10/02/2014   Ankle fracture 10/02/2014   Lung cancer (  HCC) 10/02/2014   Closed fibular fracture 10/02/2014   Dementia (HCC) 10/02/2014   Lower urinary tract infectious disease  10/02/2014   Reactive airway disease 10/02/2014   Fall 10/02/2014   Fracture of right proximal fibula    Urinary retention 10/20/2014   Ileus (HCC) 10/20/2014   Cellulitis of leg, right 10/20/2014   Encounter for antineoplastic chemotherapy 01/31/2015   Cough 08/24/2015   Chronic diarrhea 08/24/2015   Sepsis (HCC)    Headache 07/24/2016   Blurry vision 01/06/2019   Closed fracture of distal end of right femur, initial encounter (HCC) 06/11/2019   Closed fracture of lateral portion of right tibial plateau 06/11/2019   History of DVT of lower extremity 06/11/2019   Acute blood loss anemia 06/12/2019   Pyelonephritis 09/10/2019   Congestive heart failure (CHF) (HCC) 02/14/2020   Acute combined systolic and diastolic heart failure (HCC)    Elevated troponin    Dilated cardiomyopathy (HCC)    Nonocclusive coronary atherosclerosis of native coronary artery    AMS (altered mental status) 03/23/2020   Hypokalemia 03/23/2020   Prolonged Q-T interval on ECG 03/23/2020   DNR (do not resuscitate) 03/23/2020   MRSA bacteremia 03/23/2020   Normocytic anemia 03/24/2020   Urinary tract infection without hematuria    SVT (supraventricular tachycardia) (HCC) 03/26/2020   Pressure injury of skin 06/03/2020   Cellulitis 06/03/2020   Rash and nonspecific skin eruption    Metastatic malignant neoplasm (HCC)    Failure to thrive in adult    Asymptomatic bacteriuria    Malnutrition of moderate degree 06/13/2020   Complicated UTI (urinary tract infection) 04/17/2021   Atherosclerosis of aorta (HCC) 05/11/2021   PAF (paroxysmal atrial fibrillation) (HCC) 07/07/2021   History of gross hematuria 07/07/2021   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   Dysarthria 10/01/2021   Acute metabolic encephalopathy 10/01/2021   UTI (urinary tract infection) 10/01/2021   COVID-19 virus infection 10/01/2021   Resolved Ambulatory Problems    Diagnosis Date Noted   Dementia (HCC) 04/22/2009   DVT, lower  extremity (HCC) 10/20/2014   SIRS (systemic inflammatory response syndrome) (HCC) 08/24/2015   HCAP (healthcare-associated pneumonia) 08/24/2015   Influenza A 08/25/2015   Past Medical History:  Diagnosis Date   Abnormal Pap smear 2006   Anxiety    Arthritis    Atrophic vaginitis 2008   Cataract    Dyspareunia 2008   H/O osteoporosis    H/O varicella    H/O vitamin D deficiency    Heart murmur    History of measles, mumps, or rubella    History of radiation therapy 07/28/13- 08/10/13   Hypertension    Metastasis to brain [facility]) dx'd 2008   Nodule of right lung CT- 06/03/12   Nodule of right lung 06/03/12   On antineoplastic chemotherapy    Osteoporosis 2010   Shortness of breath    Status post chemotherapy 2003   Status post radiation therapy 2003   Status post radiation therapy 11/07/2005   Status post radiation therapy 06/02/2008   Thyroid adenoma    Thyroid cancer (HCC) 10/18/11 bx   Yeast infection     SOCIAL HX:  Social History   Tobacco Use   Smoking status: Never   Smokeless tobacco: Never   Tobacco comments:    smoked few years in college  Substance Use Topics   Alcohol use: No     FAMILY HX:  Family History  Problem Relation Age of Onset   Gait disorder Mother  Cancer Mother        Meningioma   Cancer Father        Pancreatic   Heart failure Sister    Cancer Maternal Aunt        menigeoma      ALLERGIES:  Allergies  Allergen Reactions   Broccoli [Brassica Oleracea] Other (See Comments)    unknown   Fruit Extracts Other (See Comments)    unknown   Sulfa Antibiotics Other (See Comments)    She cant remember  what happens      PERTINENT MEDICATIONS:  Outpatient Encounter Medications as of 03/09/2022  Medication Sig   ACETAMINOPHEN EXTRA STRENGTH 500 MG tablet Take 1,000 mg by mouth every 8 (eight) hours as needed for moderate pain.   Ascorbic Acid (VITAMIN C) 100 MG tablet Take 100 mg by mouth every evening. Gummie   cephALEXin (KEFLEX)  500 MG capsule Take 1 capsule (500 mg total) by mouth 2 (two) times daily.   donepezil (ARICEPT) 10 MG tablet TAKE 1 TABLET BY MOUTH ONCE DAILY IN THE EVENING (Patient taking differently: Take 10 mg by mouth every evening.)   feeding supplement (ENSURE ENLIVE / ENSURE PLUS) LIQD Take 237 mLs by mouth 2 (two) times daily between meals.   Hydrocortisone, Perianal, 1 % CREA Apply 1 g topically 2 (two) times daily.   hyoscyamine (LEVSIN) 0.125 MG tablet SMARTSIG:1 Tablet(s) By Mouth Every 12 Hours (Patient not taking: Reported on 01/03/2022)   loperamide (IMODIUM) 2 MG capsule Take 2 capsules (4 mg total) by mouth every 8 (eight) hours as needed (diarrhea).   Magnesium 200 MG TABS Take 200 mg by mouth daily.   Multiple Vitamin (MULTI-VITAMINS) TABS Take 1 tablet by mouth every evening.    NON FORMULARY Apply 1 application topically every 4 (four) hours as needed (for rash to buttocks).   ondansetron (ZOFRAN) 4 MG tablet Take 1 tablet (4 mg total) by mouth every 8 (eight) hours as needed for nausea or vomiting.   PREPARATION H 0.25-14-74.9 % rectal ointment Place 1 application. rectally 2 (two) times daily as needed for hemorrhoids.   prochlorperazine (COMPAZINE) 10 MG tablet Take 1 tablet (10 mg total) by mouth every 6 (six) hours as needed for nausea or vomiting.   saccharomyces boulardii (FLORASTOR) 250 MG capsule Take 1 capsule (250 mg total) by mouth 2 (two) times daily.   vitamin B-12 (CYANOCOBALAMIN) 1000 MCG tablet Take 1,000 mcg by mouth every evening.   vitamin C (ASCORBIC ACID) 250 MG tablet Take 250 mg by mouth daily.   Zinc Oxide (DESITIN) 13 % CREA Apply 1 application topically 2 (two) times daily. (Patient taking differently: Apply 1 application. topically 2 (two) times daily. To groin area)   No facility-administered encounter medications on file as of 03/09/2022.   I spent 45 minutes providing this consultation; this includes time spent with patient/family, chart review and  documentation. More than 50% of the time in this consultation was spent on counseling and coordinating communication.   Thank you for the opportunity to participate in the care of Ms. Flud.  The palliative care team will continue to follow. Please call our office at [phone] if we can be of additional assistance.   Note: Portions of this note were generated with Lobbyist. Dictation errors may occur despite best attempts at proofreading.  Teodoro Spray, NP

## 2022-03-18 IMAGING — CT CT HEAD W/O CM
3 series · 15 of 47 positions shown, 18 images · non-contrast
Comparison: CT brain 06/11/2019, 09/13/2018

CLINICAL DATA: Altered mental status

EXAM:
CT HEAD WITHOUT CONTRAST
TECHNIQUE: Contiguous axial images were obtained from the base of the skull
through the vertex without intravenous contrast.

[Series 2: head wo · axial · 0.45mm/px · z∈[+1302,+1426]mm · 9 of 31 slices shown, 12 images]
[im 3/31  brain]
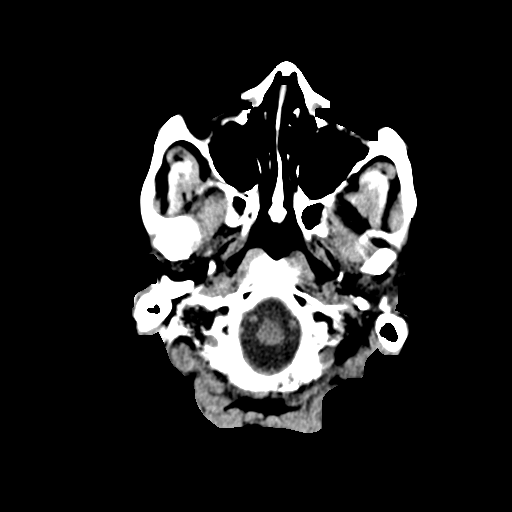
[im 3/31  bone]
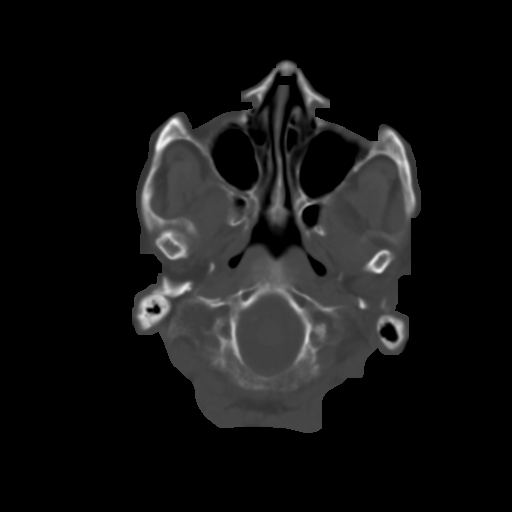
[im 6/31  brain]
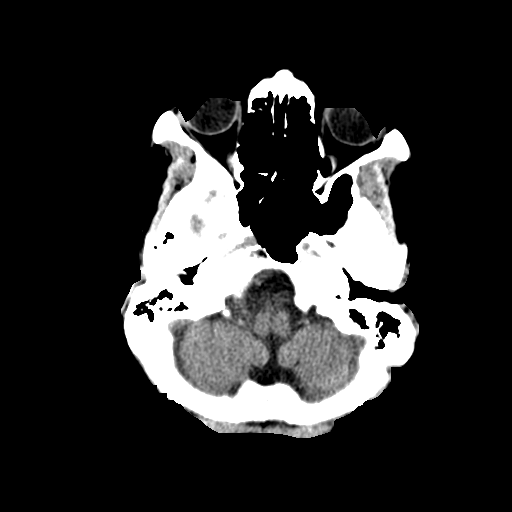
[im 9/31  brain]
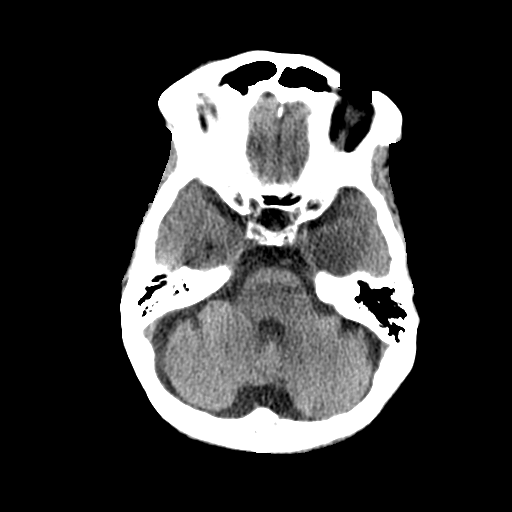
[im 12/31  brain]
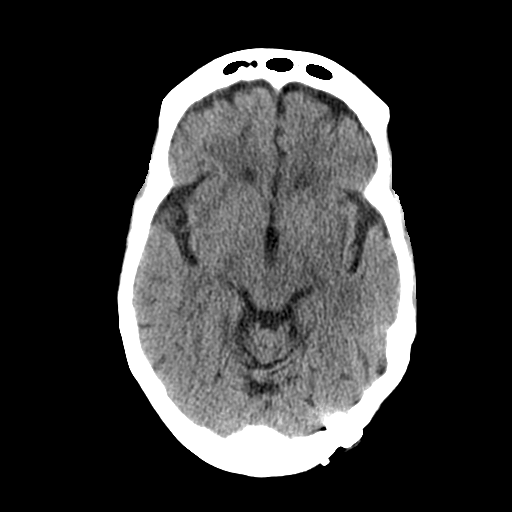
[im 16/31  brain]
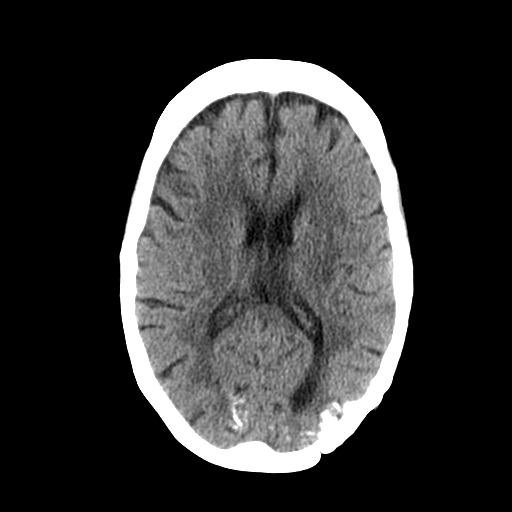
[im 16/31  bone]
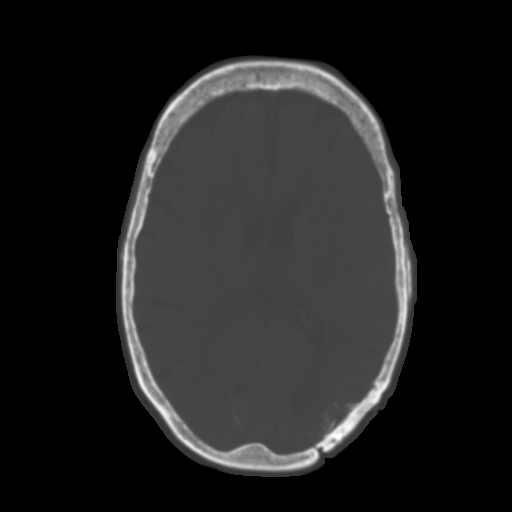
[im 19/31  brain]
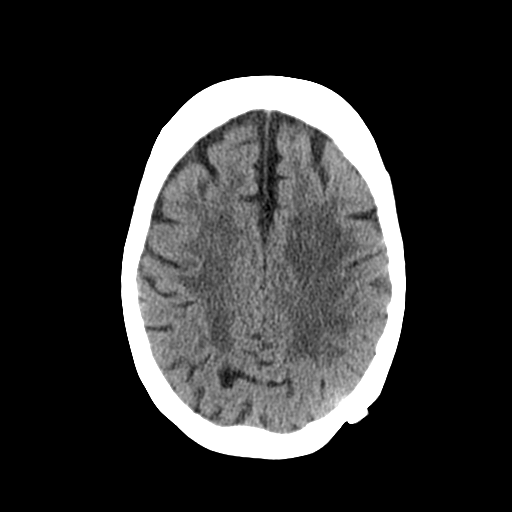
[im 22/31  brain]
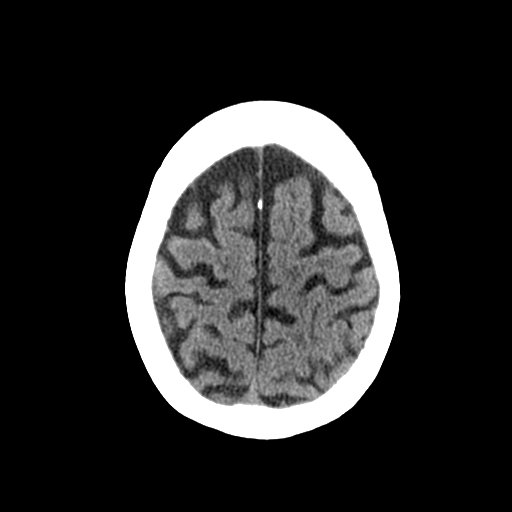
[im 25/31  brain]
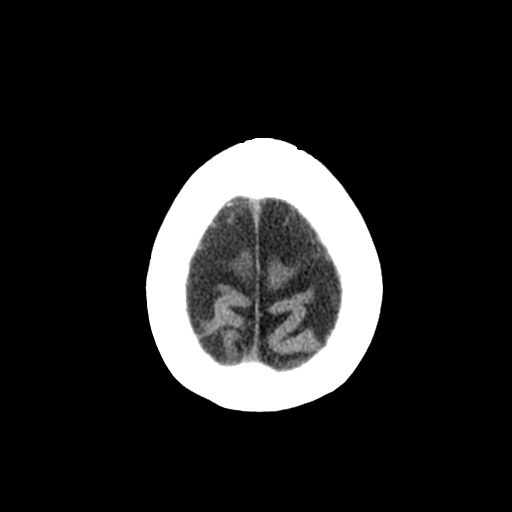
[im 28/31  brain]
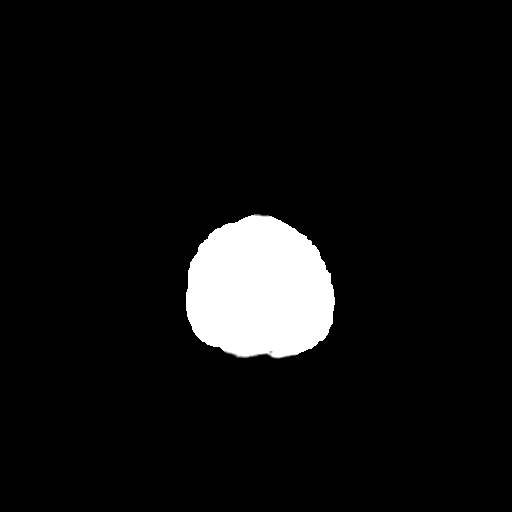
[im 28/31  bone]
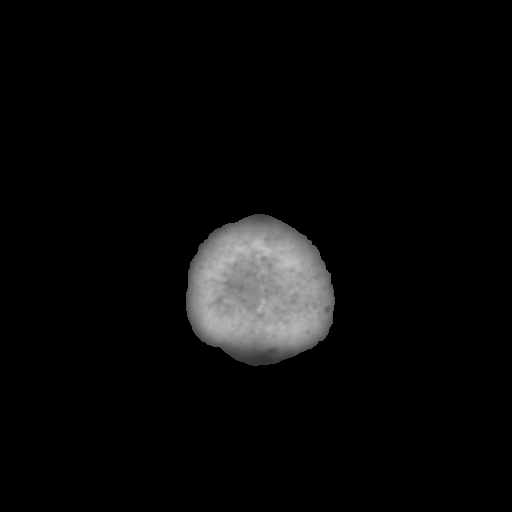

[Series 4: coronal soft tissue · coronal · 0.28mm/px · 3 of 66 slices shown]
[im 22/66  brain]
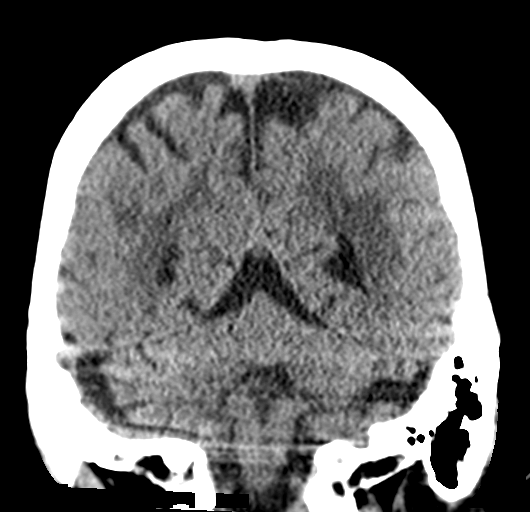
[im 29/66  brain]
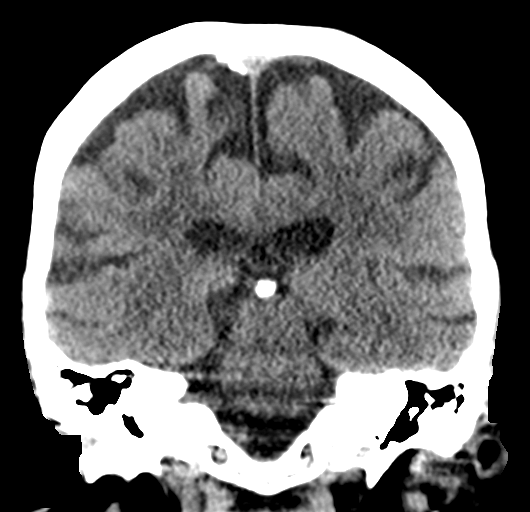
[im 37/66  brain]
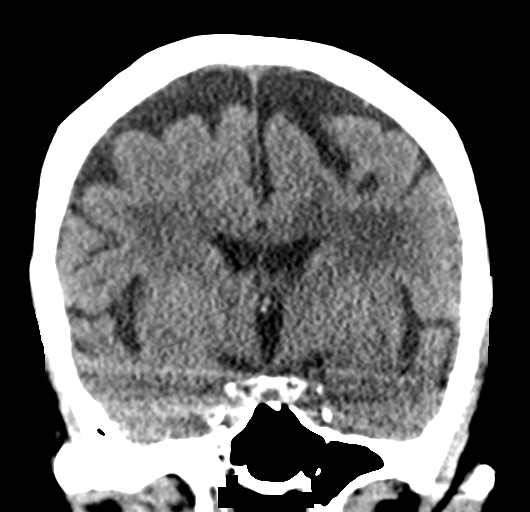

[Series 5: sagittal soft tissue · sagittal · 0.28mm/px · 3 of 50 slices shown]
[im 17/50  brain]
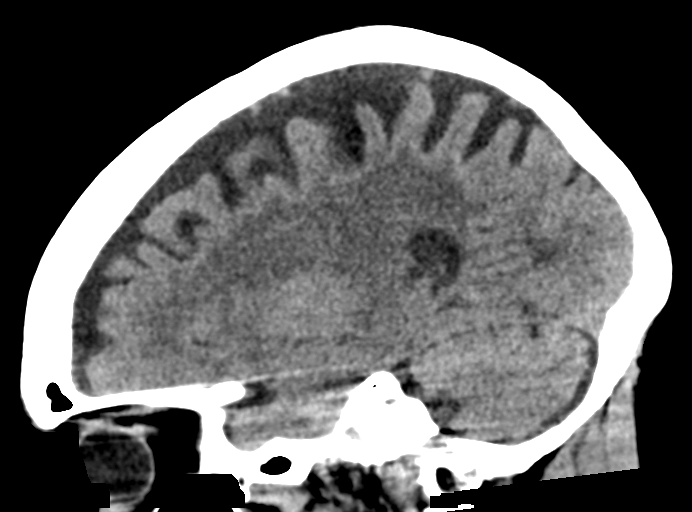
[im 25/50  brain]
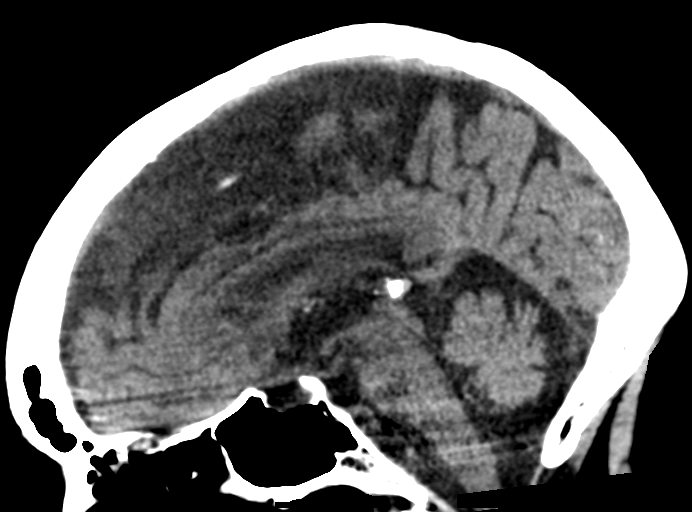
[im 33/50  brain]
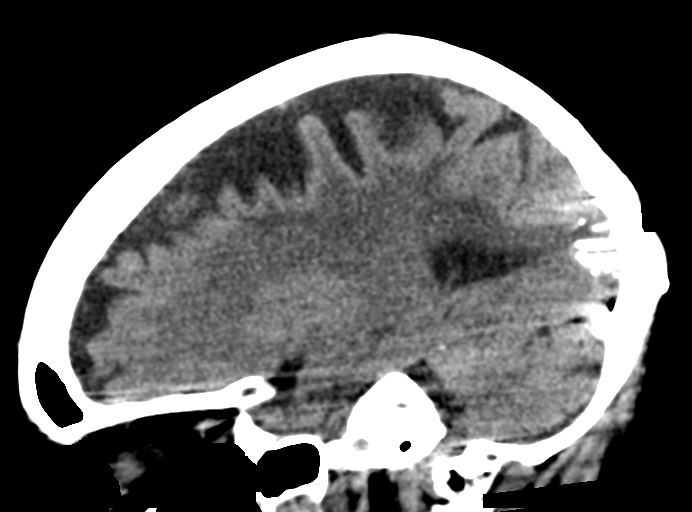

[15 of 47 positions shown; findings below may reference images not displayed]

FINDINGS: Brain: No acute territorial infarction, hemorrhage or intracranial
mass. Atrophy and white matter hypodensity consistent with chronic
small vessel ischemic change. Stable ventricle size. Gyriform
calcifications at the left greater than right occipital lobes,
slightly increased.

Vascular: No hyperdense vessels.  Scattered carotid calcification

Skull: Left posterior craniotomy.  No acute fracture

Sinuses/Orbits: No acute finding.

Other: None
IMPRESSION: 1. No CT evidence for acute intracranial abnormality.
2. Atrophy and chronic small vessel ischemic changes of the white
matter. Previous left posterior craniotomy with left greater than
right occipital gyral calcifications.

## 2022-03-18 IMAGING — DX DG CHEST 1V PORT
1 series · 1 of 1 positions shown · non-contrast
Comparison: None.

CLINICAL DATA: Altered mental status with foul-smelling urine.

EXAM:
PORTABLE CHEST 1 VIEW

[chest ap]
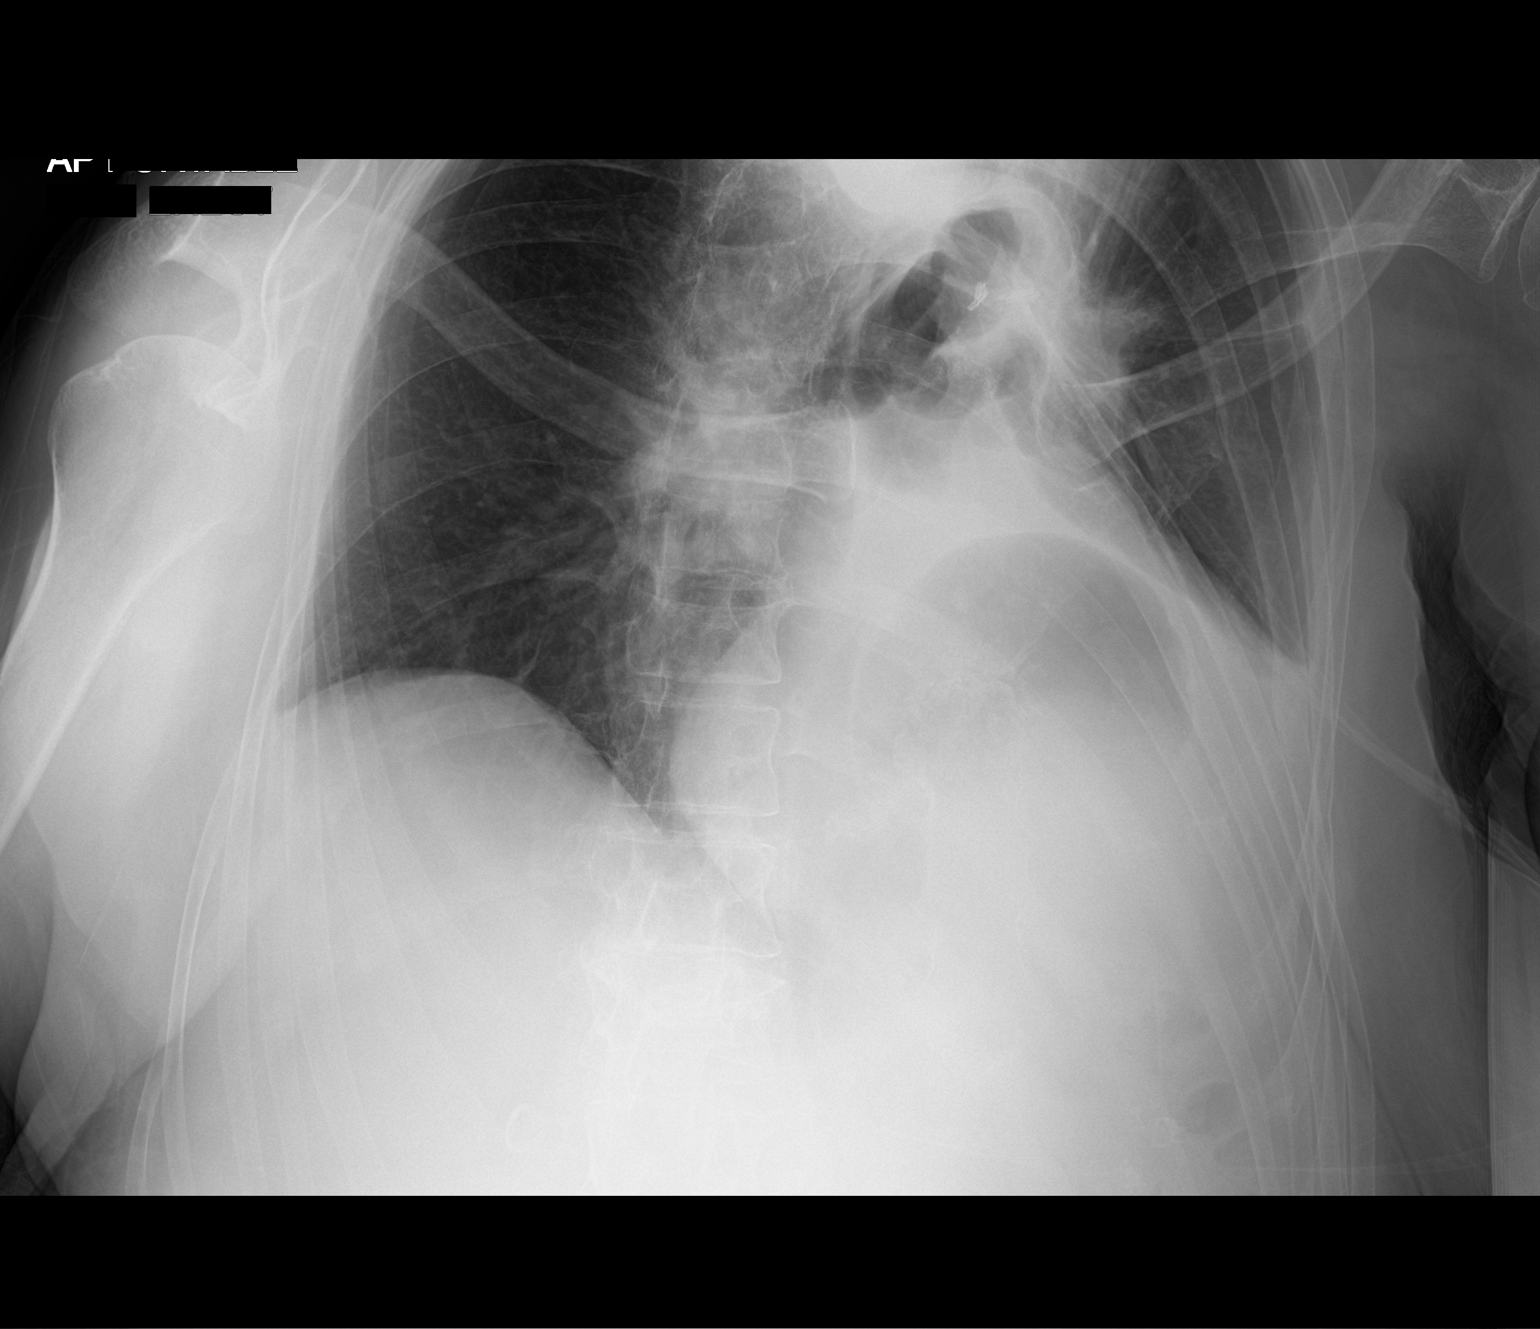

[1 of 1 positions shown; findings below may reference images not displayed]

FINDINGS: The study is limited secondary to patient rotation. Mild atelectasis
is seen within the left lung base. A small left pleural effusion is
also noted. Radiopaque surgical clips are seen overlying the medial
aspect of the upper left lung. The right lung is clear. No
pneumothorax is identified. Marked severity elevation of the left
hemidiaphragm is seen. As a result, a large gastric air bubble is
seen within the retrocardiac region. The heart size and mediastinal
contours are within normal limits. Multiple chronic left-sided rib
fractures are noted.
IMPRESSION: 1. Marked severity elevation of the left hemidiaphragm with mild
left basilar atelectasis.
2. Small left pleural effusion.
3. Postsurgical changes overlying the upper left lung.

## 2022-04-08 ENCOUNTER — Emergency Department (HOSPITAL_COMMUNITY)
Admission: EM | Admit: 2022-04-08 | Discharge: 2022-04-08 | Disposition: A | Discharge status: Skilled Nursing Facility | Payer: Medicare PPO | Attending: Emergency Medicine | Admitting: Emergency Medicine

## 2022-04-08 ENCOUNTER — Other Ambulatory Visit: Payer: Self-pay

## 2022-04-08 ENCOUNTER — Emergency Department (HOSPITAL_COMMUNITY): Payer: Medicare PPO

## 2022-04-08 ENCOUNTER — Encounter (HOSPITAL_COMMUNITY): Payer: Self-pay

## 2022-04-08 DIAGNOSIS — Z85118 Personal history of other malignant neoplasm of bronchus and lung: Secondary | ICD-10-CM | POA: Insufficient documentation

## 2022-04-08 DIAGNOSIS — Z7401 Bed confinement status: Secondary | ICD-10-CM | POA: Diagnosis not present

## 2022-04-08 DIAGNOSIS — R29898 Other symptoms and signs involving the musculoskeletal system: Secondary | ICD-10-CM | POA: Diagnosis not present

## 2022-04-08 DIAGNOSIS — R059 Cough, unspecified: Secondary | ICD-10-CM

## 2022-04-08 DIAGNOSIS — Z20822 Contact with and (suspected) exposure to covid-19: Secondary | ICD-10-CM | POA: Insufficient documentation

## 2022-04-08 DIAGNOSIS — I5040 Unspecified combined systolic (congestive) and diastolic (congestive) heart failure: Secondary | ICD-10-CM | POA: Insufficient documentation

## 2022-04-08 DIAGNOSIS — C3490 Malignant neoplasm of unspecified part of unspecified bronchus or lung: Secondary | ICD-10-CM | POA: Diagnosis not present

## 2022-04-08 DIAGNOSIS — R918 Other nonspecific abnormal finding of lung field: Secondary | ICD-10-CM | POA: Diagnosis not present

## 2022-04-08 DIAGNOSIS — J90 Pleural effusion, not elsewhere classified: Secondary | ICD-10-CM | POA: Diagnosis not present

## 2022-04-08 DIAGNOSIS — R5383 Other fatigue: Secondary | ICD-10-CM | POA: Diagnosis not present

## 2022-04-08 LAB — BASIC METABOLIC PANEL
Anion gap: 5 (ref 5–15)
BUN: 24 mg/dL — ABNORMAL HIGH (ref 8–23)
CO2: 24 mmol/L (ref 22–32)
Calcium: 9.2 mg/dL (ref 8.9–10.3)
Chloride: 111 mmol/L (ref 98–111)
Creatinine, Ser: 0.94 mg/dL (ref 0.44–1.00)
GFR, Estimated: >60 mL/min (ref >60)
Glucose, Bld: 109 mg/dL — ABNORMAL HIGH (ref 70–99)
Potassium: 3.7 mmol/L (ref 3.5–5.1)
Sodium: 140 mmol/L (ref 135–145)

## 2022-04-08 LAB — CBC WITH DIFFERENTIAL/PLATELET
Abs Immature Granulocytes: 0.03 10*3/uL (ref 0.00–0.07)
Basophils Absolute: 0 10*3/uL (ref 0.0–0.1)
Basophils Relative: 0 %
Eosinophils Absolute: 0.1 10*3/uL (ref 0.0–0.5)
Eosinophils Relative: 1 %
HCT: 39.4 % (ref 36.0–46.0)
Hemoglobin: 12.3 g/dL (ref 12.0–15.0)
Immature Granulocytes: 0 %
Lymphocytes Relative: 7 %
Lymphs Abs: 0.8 10*3/uL (ref 0.7–4.0)
MCH: 29.5 pg (ref 26.0–34.0)
MCHC: 31.2 g/dL (ref 30.0–36.0)
MCV: 94.5 fL (ref 80.0–100.0)
Monocytes Absolute: 0.5 10*3/uL (ref 0.1–1.0)
Monocytes Relative: 5 %
Neutro Abs: 9.9 10*3/uL — ABNORMAL HIGH (ref 1.7–7.7)
Neutrophils Relative %: 87 %
Platelets: 237 10*3/uL (ref 150–400)
RBC: 4.17 MIL/uL (ref 3.87–5.11)
RDW: 14 % (ref 11.5–15.5)
WBC: 11.4 10*3/uL — ABNORMAL HIGH (ref 4.0–10.5)
nRBC: 0 % (ref 0.0–0.2)

## 2022-04-08 LAB — RESP PANEL BY RT-PCR (FLU A&B, COVID) ARPGX2
Influenza A by PCR: NEGATIVE
Influenza B by PCR: NEGATIVE
SARS Coronavirus 2 by RT PCR: NEGATIVE

## 2022-04-08 NOTE — Discharge Instructions (Addendum)
Ms. Brossman was seen in the emergency room for her cough.  Our work-up does not reveal any elevated white count, increased oxygen requirement and the chest x-ray is reassuring.  No evidence of pneumonia.  Her hemoglobin and white count are also normal.  At this time, we recommend continued close monitoring of her respiratory status.  If she starts getting increasing shortness of breath, fevers, chills, confusion please bring her back to the emergency room.  Otherwise she should get evaluation by medical team in your staff.

## 2022-04-08 NOTE — ED Triage Notes (Signed)
EMS reports from [facility], c/o productive cough since yesterday. Hx of lung CA.  BP 142/80 HR 87 RR 18 Sp02 92 RA Temp 97.2

## 2022-04-08 NOTE — ED Provider Notes (Signed)
[facility] DEPT Provider Note   CSN: [id number] Arrival date & time: 04/08/22  3419     History  Chief Complaint  Patient presents with   Cough    Valerie [name] is a 76 y.o. female.  HPI     76 year old female with history of metastatic non-small cell lung cancer currently on not on any treatment, paroxysmal A-fib not on anticoagulation, combined systolic/diastolic congestive heart failure, history of DVT comes in with chief complaint of cough.  Patient resides at nursing home.  Patient is complaining of productive cough since yesterday.  She indicates that the cough is producing white, clear phlegm.  She denies any nausea, vomiting, fevers, chills.  Home Medications Prior to Admission medications   Medication Sig Start Date End Date Taking? Authorizing Provider  ACETAMINOPHEN EXTRA STRENGTH 500 MG tablet Take 1,000 mg by mouth every 8 (eight) hours as needed for moderate pain. 08/28/21   [provider]  Ascorbic Acid (VITAMIN C) 100 MG tablet Take 100 mg by mouth every evening. Gummie    [provider]  cephALEXin (KEFLEX) 500 MG capsule Take 1 capsule (500 mg total) by mouth 2 (two) times daily. 01/03/22   Sherrell Puller, PA-C  donepezil (ARICEPT) 10 MG tablet TAKE 1 TABLET BY MOUTH ONCE DAILY IN THE EVENING Patient taking differently: Take 10 mg by mouth every evening. 04/03/21   [name], MD  feeding supplement (ENSURE ENLIVE / ENSURE PLUS) LIQD Take 237 mLs by mouth 2 (two) times daily between meals. 10/04/21   Shelly Coss, MD  Hydrocortisone, Perianal, 1 % CREA Apply 1 g topically 2 (two) times daily. 01/02/22   [provider]  hyoscyamine (LEVSIN) 0.125 MG tablet SMARTSIG:1 Tablet(s) By Mouth Every 12 Hours Patient not taking: Reported on 01/03/2022 01/02/22   [provider]  loperamide (IMODIUM) 2 MG capsule Take 2 capsules (4 mg total) by mouth every 8 (eight) hours as needed (diarrhea). 10/04/21    Shelly Coss, MD  Magnesium 200 MG TABS Take 200 mg by mouth daily.    [provider]  Multiple Vitamin (MULTI-VITAMINS) TABS Take 1 tablet by mouth every evening.     [provider]  NON FORMULARY Apply 1 application topically every 4 (four) hours as needed (for rash to buttocks).    [provider]  ondansetron (ZOFRAN) 4 MG tablet Take 1 tablet (4 mg total) by mouth every 8 (eight) hours as needed for nausea or vomiting. 10/04/21   Shelly Coss, MD  PREPARATION H 0.25-14-74.9 % rectal ointment Place 1 application. rectally 2 (two) times daily as needed for hemorrhoids. 10/27/21   [provider]  prochlorperazine (COMPAZINE) 10 MG tablet Take 1 tablet (10 mg total) by mouth every 6 (six) hours as needed for nausea or vomiting. 05/30/20   Curt Bears, MD  saccharomyces boulardii (FLORASTOR) 250 MG capsule Take 1 capsule (250 mg total) by mouth 2 (two) times daily. 10/04/21   Shelly Coss, MD  vitamin B-12 (CYANOCOBALAMIN) 1000 MCG tablet Take 1,000 mcg by mouth every evening.    [provider]  vitamin C (ASCORBIC ACID) 250 MG tablet Take 250 mg by mouth daily. 11/09/21   [provider]  Zinc Oxide (DESITIN) 13 % CREA Apply 1 application topically 2 (two) times daily. Patient taking differently: Apply 1 application. topically 2 (two) times daily. To groin area 09/20/21   Drenda Freeze, MD      Allergies    Eual Fines Marta Lamas oleracea], Fruit  extracts, and Sulfa antibiotics    Review of Systems   Review of Systems  All other systems reviewed and are negative.   Physical Exam Updated Vital Signs BP 112/74   Pulse 86   Temp 97.8 F (36.6 C) (Oral)   Resp (!) 26   SpO2 96%  Physical Exam Vitals and nursing note reviewed.  Constitutional:      Appearance: She is well-developed.  HENT:     Head: Atraumatic.  Cardiovascular:     Rate and Rhythm: Normal rate.  Pulmonary:     Effort: Pulmonary effort is normal.      Breath sounds: No wheezing, rhonchi or rales.  Musculoskeletal:     Cervical back: Normal range of motion and neck supple.  Skin:    General: Skin is warm and dry.  Neurological:     Mental Status: She is alert and oriented to person, place, and time.     ED Results / Procedures / Treatments   Labs (all labs ordered are listed, but only abnormal results are displayed) Labs Reviewed  BASIC METABOLIC PANEL - Abnormal; Notable for the following components:      Result Value   Glucose, Bld 109 (*)    BUN 24 (*)    All other components within normal limits  CBC WITH DIFFERENTIAL/PLATELET - Abnormal; Notable for the following components:   WBC 11.4 (*)    Neutro Abs 9.9 (*)    All other components within normal limits  RESP PANEL BY RT-PCR (FLU A&B, COVID) [id number]    EKG None  Radiology DG Chest Port 1 View  Result Date: 04/08/2022 CLINICAL DATA:  Cough, lung cancer EXAM: PORTABLE CHEST 1 VIEW COMPARISON:  10/01/2021 FINDINGS: Right lung is clear. Postsurgical changes of the left lung from prior partial pneumonectomy with left lung volume loss. Small left pleural effusion. No focal consolidation. No pneumothorax. Stable cardiomediastinal silhouette. No acute osseous abnormality. IMPRESSION: 1. Small left pleural effusion. Otherwise no acute cardiopulmonary disease. Electronically Signed   By: Kathreen Devoid M.D.   On: 04/08/2022 08:56    Procedures Procedures    Medications Ordered in ED Medications - No data to display  ED Course/ Medical Decision Making/ A&P Clinical Course as of 04/08/22 1245  Sun Apr 08, 2022  1243 2022 CT scan of the chest results are as below:  IMPRESSION: 1. Stable appearance of the left hemithorax status post left lower lobectomy and external beam radiation. Persistent small left pleural effusion. 2. Again seen is chronic right upper lobe peribronchovascular increased soft tissue. This appears slightly increased in size in the interval as measured  off the coronal images. 3. Similar appearance of bilateral hydronephrosis and hydroureter. 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 Hydroureter extends to the level of the urinary bladder where there is signs chronic bladder outlet obstruction. Previously noted bladder wall thickening and mucosal enhancement has improved in the interval. 4. Aortic atherosclerosis. 5. Similar appearance of T10, L1, L4, and L5 compression deformities.   Aortic Atherosclerosis (ICD10-I70.0).   [AN]    Clinical Course User Index [AN] Varney Biles, MD                           Medical Decision Making Amount and/or Complexity of Data Reviewed Labs: ordered. Radiology: ordered.   This patient presents to the ED with chief complaint(s) of cough with pertinent past medical history of previous history of lung mass, A-fib, DVT.The complaint  involves an extensive differential diagnosis and also carries with it a high risk of complications and morbidity.    The differential diagnosis includes pneumonia, COVID-19, pleural effusion, pulmonary edema, bronchitis  Patient is appearing nontoxic.  No respiratory distress.  No tachypnea on initial evaluation.  The initial plan is to get basic labs and chest x-ray.   Additional history obtained: Records reviewed  previous CT scan -which did not show any concerning findings.  Independent labs interpretation:  The following labs were independently interpreted: Patient has history of lobectomy, but my initial lung exam was overall reassuring and I did not appreciate diminished breath sounds  Independent visualization and interpretation of imaging: - I independently visualized the following imaging with scope of interpretation limited to determining acute life threatening conditions related to emergency care: X-ray of the chest, which revealed no evidence of pneumonia or focal consolidation or pulm edema  Treatment and Reassessment: Patient  reassessed.  COVID-19 test pending.  Nursing staff to follow-up on the test.  Clinical suspicion for COVID-19 is low.  Patient has mildly elevated white count.  Metabolic profile is normal.  Anticipate discharge.    Complexity of problems addressed:  3: acute uncomplicated illness/injury (j)  Final Clinical Impression(s) / ED Diagnoses Final diagnoses:  Cough, unspecified type  Lung mass    Rx / DC Orders ED Discharge Orders     None         Varney Biles, MD 04/08/22 1409

## 2022-04-12 ENCOUNTER — Telehealth: Payer: Self-pay

## 2022-04-12 NOTE — Telephone Encounter (Signed)
error 

## 2022-04-19 DIAGNOSIS — Z8673 Personal history of transient ischemic attack (TIA), and cerebral infarction without residual deficits: Secondary | ICD-10-CM | POA: Diagnosis not present

## 2022-04-19 DIAGNOSIS — I480 Paroxysmal atrial fibrillation: Secondary | ICD-10-CM | POA: Diagnosis not present

## 2022-04-19 DIAGNOSIS — I5042 Chronic combined systolic (congestive) and diastolic (congestive) heart failure: Secondary | ICD-10-CM | POA: Diagnosis not present

## 2022-04-22 IMAGING — CT CT CHEST W/ CM
2 of 5 series · 12 of 36 positions shown, 15 images · IV contrast (OMNIPAQUE)
Comparison: CT chest from 5555 and abdomen and pelvis CT from [facility]

CLINICAL DATA: Non-small cell lung cancer staging, history of
metastatic non-small cell lung cancer. Post partial lung resection
and radiotherapy with ongoing systemic therapy.

EXAM:
CT CHEST, ABDOMEN, AND PELVIS WITH CONTRAST
TECHNIQUE: Multidetector CT imaging of the chest, abdomen and pelvis was
performed following the standard protocol during bolus
administration of intravenous contrast.
CONTRAST:  100mL OMNIPAQUE IOHEXOL 300 MG/ML  SOLN

[Series 2: cap with · axial · 0.79mm/px · z∈[+1203,+1733]mm · 9 of 134 slices shown, 12 images]
[im 14/134  mediastinal]
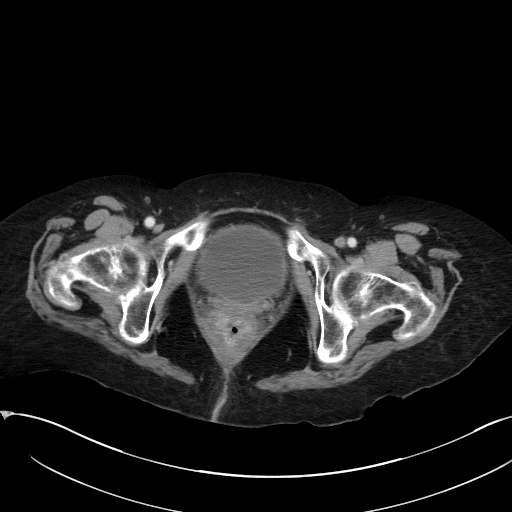
[im 14/134  lung]
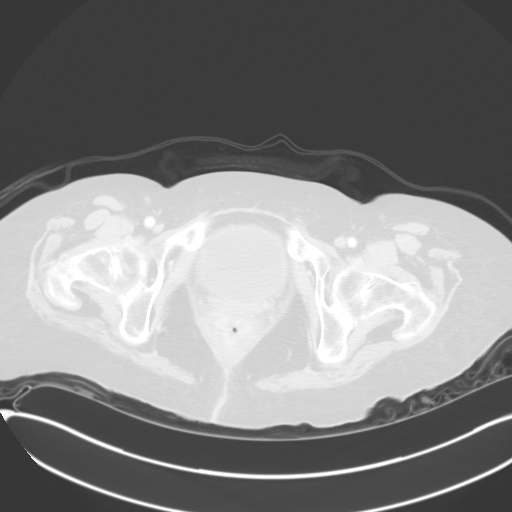
[im 27/134  lung]
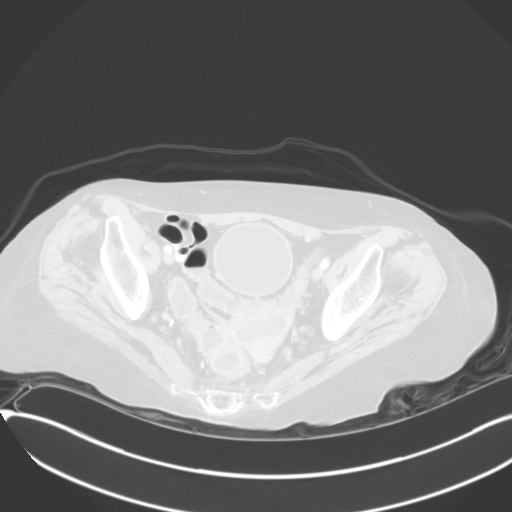
[im 40/134  lung]
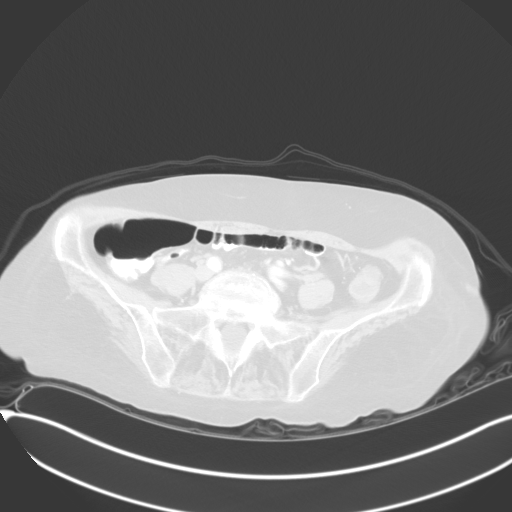
[im 54/134  lung]
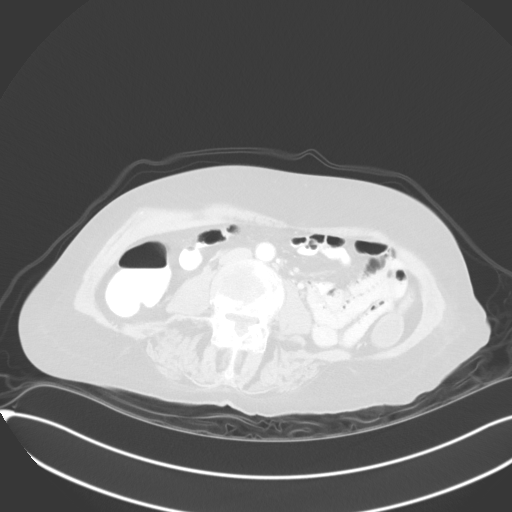
[im 67/134  mediastinal]
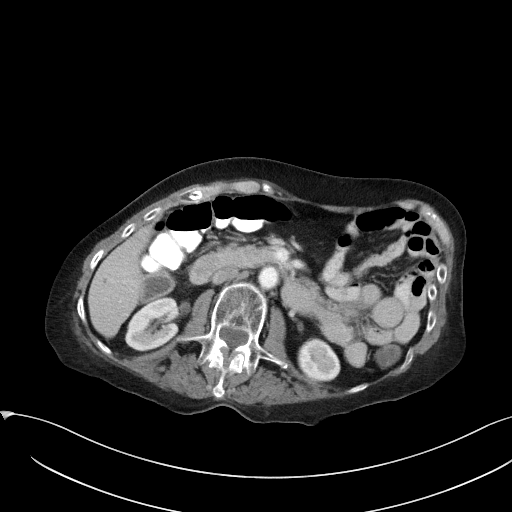
[im 67/134  lung]
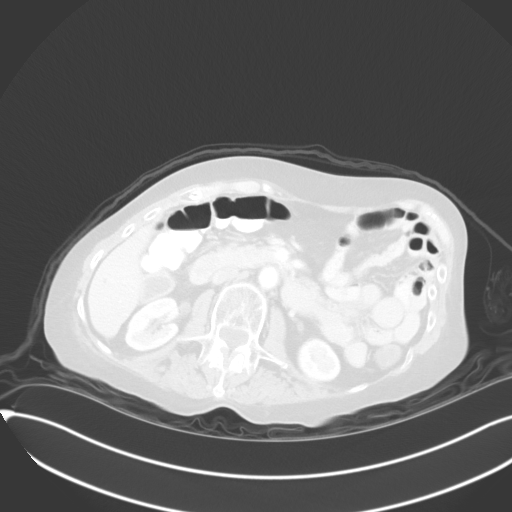
[im 80/134  lung]
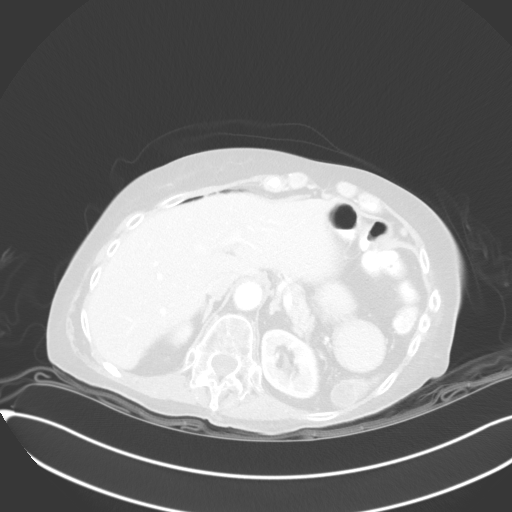
[im 94/134  lung]
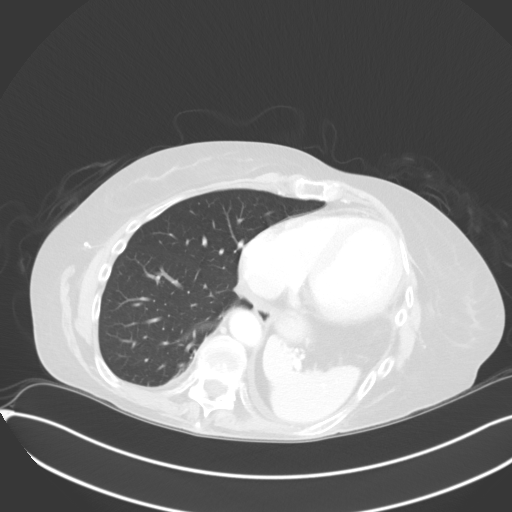
[im 107/134  lung]
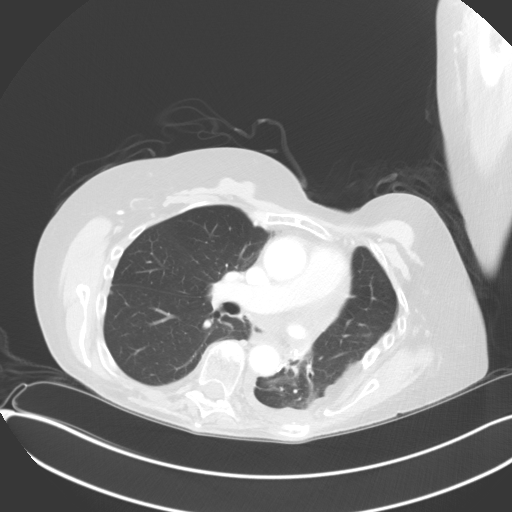
[im 120/134  mediastinal]
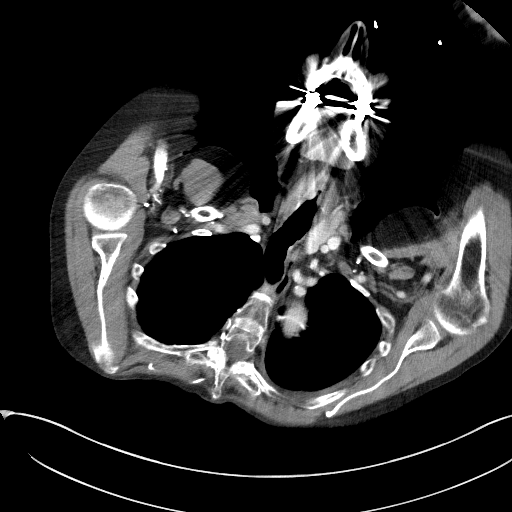
[im 120/134  lung]
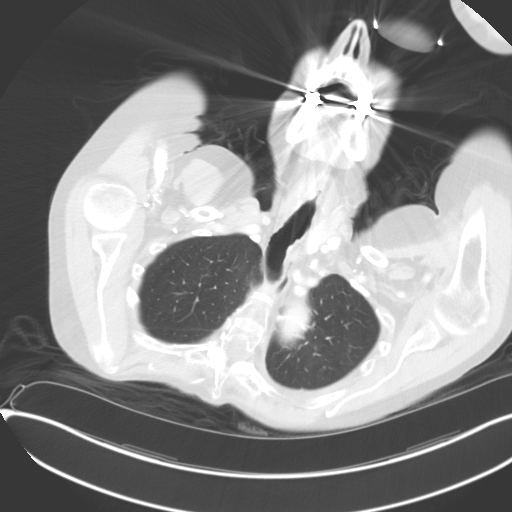

[Series 4: coronals · coronal · 1.09mm/px · 3 of 115 slices shown]
[im 23/115  lung]
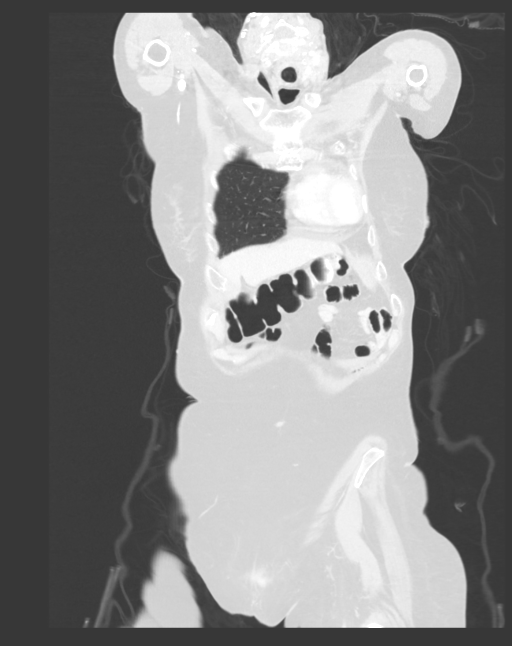
[im 46/115  lung]
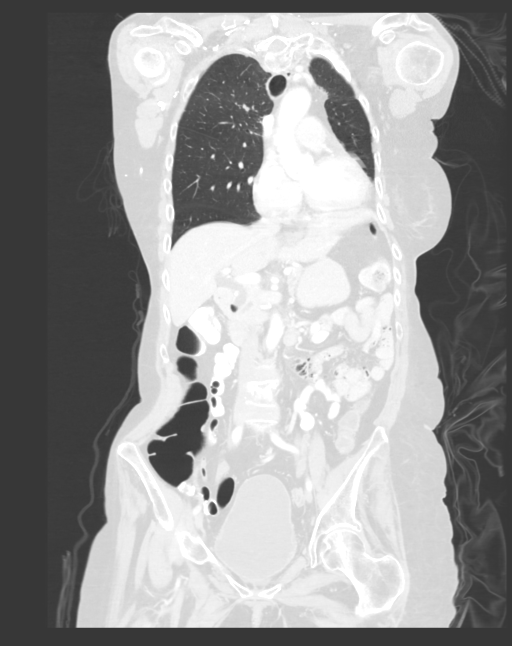
[im 69/115  lung]
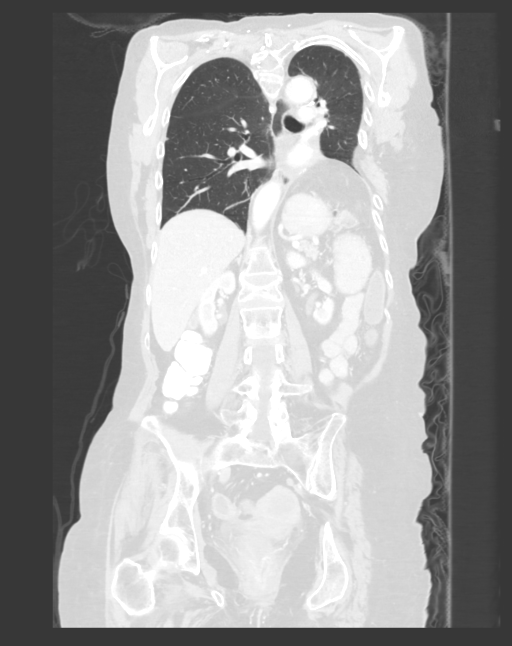

[12 of 36 positions shown; findings below may reference images not displayed]

FINDINGS: CT CHEST FINDINGS

Cardiovascular: Stable appearance of the aorta. Calcified
atheromatous plaque in the thoracic aorta is mild. No aneurysmal
dilation.

Diminished size of pericardial fluid that was present on the prior
study with only trace pericardial fluid remaining. Central pulmonary
vasculature aside from distortion after LEFT lower lobectomy at the
LEFT hilum is unremarkable on venous phase assessment.

Mediastinum/Nodes: Post RIGHT hemithyroidectomy.

No axillary lymphadenopathy. No mediastinal or hilar
lymphadenopathy.

Lungs/Pleura: Post thoracotomy for LEFT lower lobectomy with
distortion of the LEFT chest wall showing similar appearance to the
prior study. Small LEFT-sided pleural effusion is diminished in
size.

Peribronchovascular nodularity in the RIGHT upper lobe with similar
appearance, nodularity is within 1 mm of previous size at
approximately 7 x 9 mm.

(Image 47, series 6) dominant area of soft tissue bronchovascular
bundle at 12 x 7 mm on today's study previously approximately 16 x
11 mm.

Small nodule at the RIGHT lung base measuring 7 x 4 mm

Musculoskeletal: See below for full musculoskeletal detail. Kyphotic
angulation about a compression fracture in the lower thoracic spine
shows similar appearance to the previous imaging study.

CT ABDOMEN PELVIS FINDINGS

Hepatobiliary: No focal, suspicious hepatic lesion. Gallbladder is
collapsed. Mild biliary ductal distension similar to the prior study
in the setting of small periampullary duodenal diverticulum.

Pancreas: Pancreas without focal lesion or signs of ductal dilation.
No pancreatic inflammation.

Spleen: Spleen normal in size and contour.

Adrenals/Urinary Tract: Adrenal glands are normal.

Symmetric renal enhancement. No hydronephrosis. Trabeculated urinary
bladder, mildly trabeculated with mild perivesical stranding shows a
similar appearance to the previous study.

Stomach/Bowel: Stomach and small bowel without signs of acute
process. The appendix is normal.

Sigmoid colon with mild thickening. There was a large amount of
rectal stool on the prior study. This is not present on the current
exam. There is mild perirectal stranding previously. The rectum is
thickened with mural stratification and mucosal enhancement.

Vascular/Lymphatic: Calcified atheromatous plaque in the abdominal
aorta. No adenopathy in the retroperitoneum.

No adenopathy in the pelvis.

Reproductive: Thickening of the endometrium is suggested with
hypodense area in the uterus centrally measuring up to 13 mm.
Presumed leiomyoma in the fundus of the uterus is unchanged. No
adnexal mass.

Other: No ascites.  No free air.

Musculoskeletal: Signs of vertebral compression fractures with
similar appearance to previous imaging postprocedural changes of
thoracotomy about the LEFT chest wall. No destructive bone finding.
Spinal degenerative changes elsewhere and similar appearance of L1
and L3 compression fracture.
IMPRESSION: 1. Imaging findings suggestive of proctocolitis,, correlate with any
abdominal or pelvic symptoms.
2. Signs of prior LEFT lower lobectomy with distortion of the LEFT
chest wall.
3. Decrease in size of LEFT-sided pleural effusion.
4. New small nodule in the RIGHT lower lobe, attention on follow-up
potentially related to disease but currently quite small. Short
interval follow-up may be helpful.
5. Nodularity and soft tissue along the bronchovascular structures
in the RIGHT upper lobe stable to slightly diminished in size with
diminished peripheral nodularity.
6. No signs of metastatic disease to the abdomen or pelvis.
7. Thickening of the endometrium with hypodense area in the uterus
centrally measuring up to 13 mm. Neoplasm could have this appearance
though findings are nonspecific and not significantly changed from
previous imaging.
8. Trabeculated urinary bladder, mildly trabeculated with mild
perivesical stranding. Findings are similar to the prior study and
may be due to chronic outlet obstruction. Correlate with any
symptoms of cystitis.
9. Aortic atherosclerosis.

These results will be called to the ordering clinician or
representative by the Radiologist Assistant, and communication
documented in the PACS or [REDACTED].

Aortic Atherosclerosis (POSYG-827.7).

## 2022-05-30 IMAGING — CT CT ABD-PELV W/ CM
2 of 24 series · 8 of 48 positions shown, 13 images · IV contrast (omnipaque)
Comparison: April 26, 2020.

CLINICAL DATA: High probability of pulmonary embolus. Left-sided
abdominal pain.

EXAM:
CT ANGIOGRAPHY CHEST
CT ABDOMEN AND PELVIS WITH CONTRAST
TECHNIQUE: Multidetector CT imaging of the chest was performed using the
standard protocol during bolus administration of intravenous
contrast. Multiplanar CT image reconstructions and MIPs were
obtained to evaluate the vascular anatomy. Multidetector CT imaging
of the abdomen and pelvis was performed using the standard protocol
during bolus administration of intravenous contrast.
CONTRAST:  100mL OMNIPAQUE IOHEXOL 350 MG/ML SOLN

[Series 11: thins · axial · 0.77mm/px · z∈[+1594,+1772]mm · 4 of 298 slices shown (1 of 2)]
[im 60/298  soft-tissue]
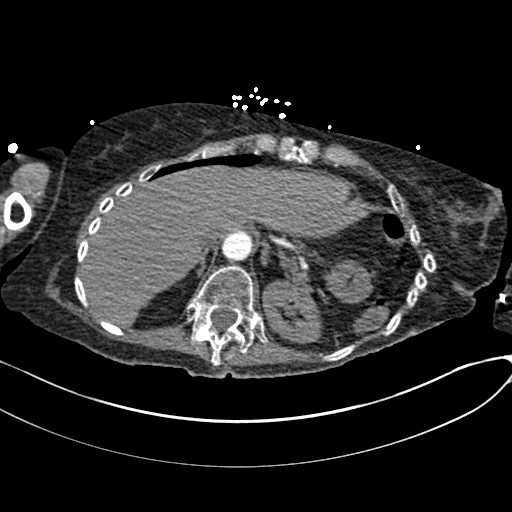
[im 119/298  soft-tissue]
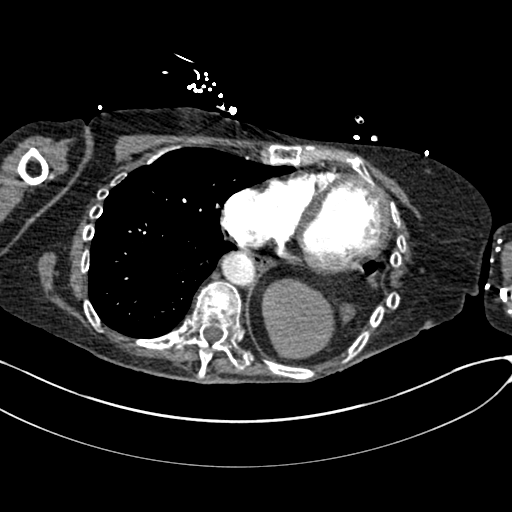
[im 179/298  soft-tissue]
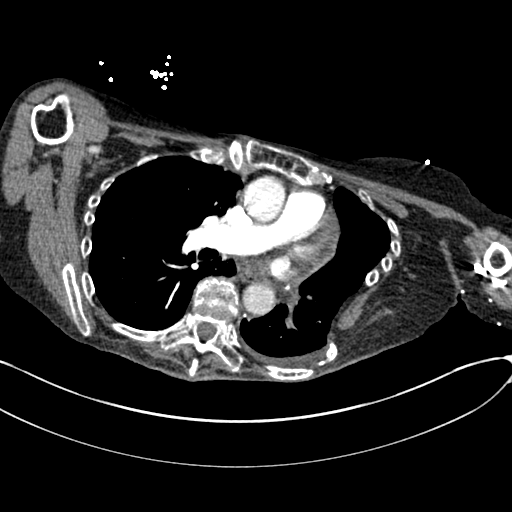
[im 238/298  soft-tissue]
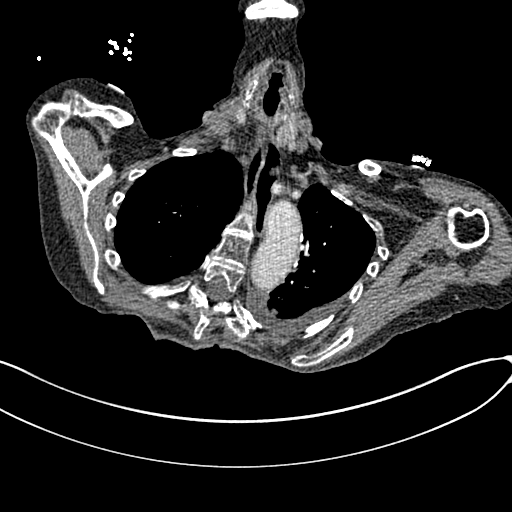

[Series 11: thins · axial · 0.77mm/px · z∈[+1594,+1772]mm · 4 of 298 slices shown, 9 images (2 of 2)]
[im 60/298  soft-tissue]
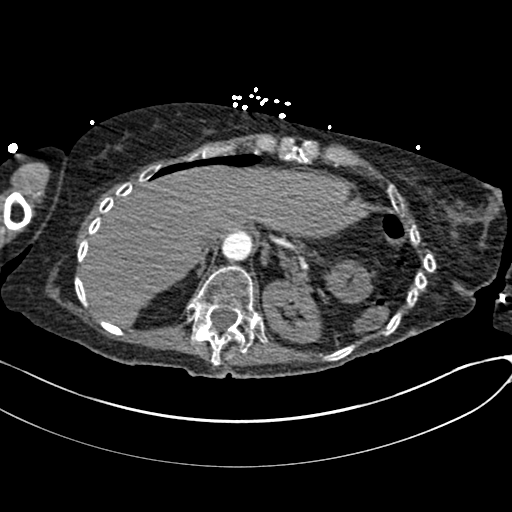
[im 60/298  lung]
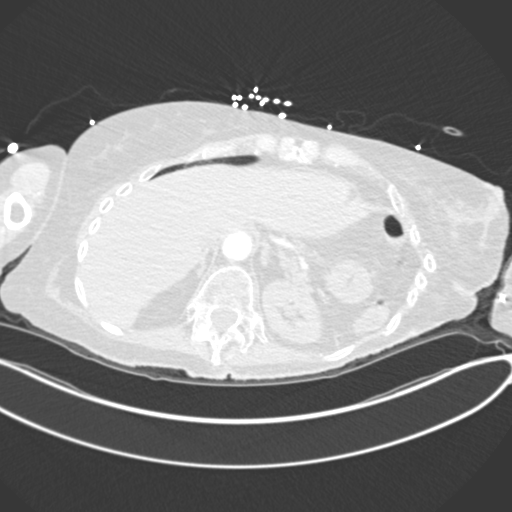
[im 60/298  bone]
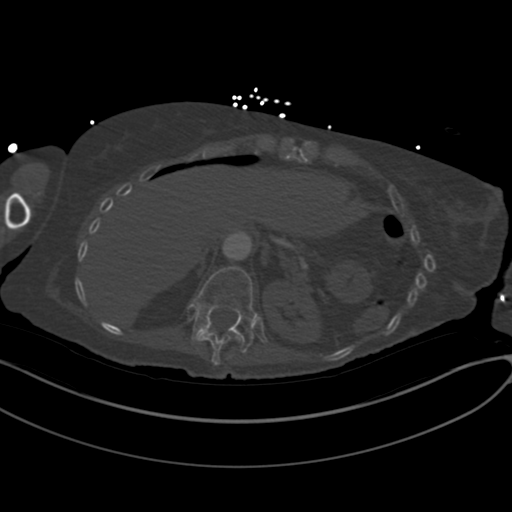
[im 119/298  soft-tissue]
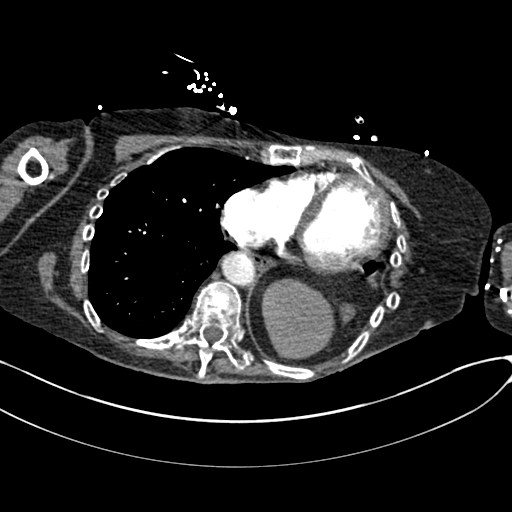
[im 119/298  lung]
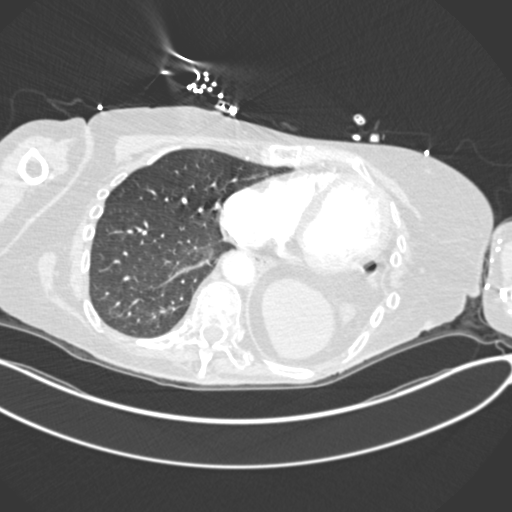
[im 179/298  soft-tissue]
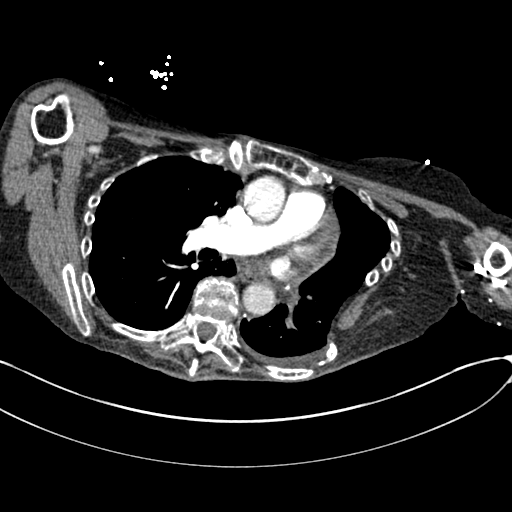
[im 179/298  lung]
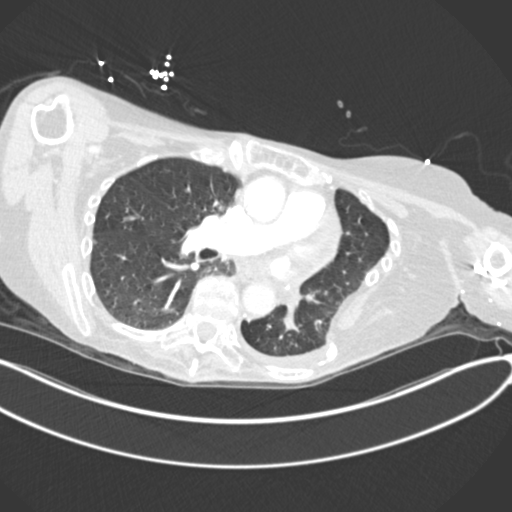
[im 238/298  soft-tissue]
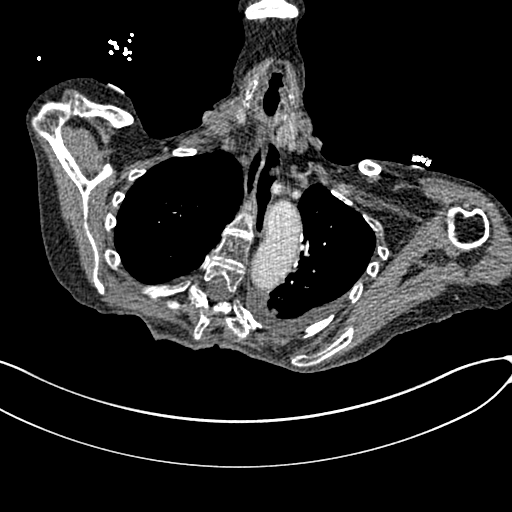
[im 238/298  lung]
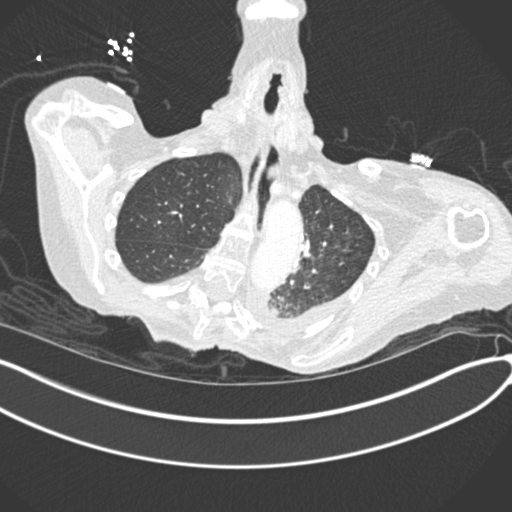

[8 of 48 positions shown; findings below may reference images not displayed]

FINDINGS: CTA CHEST FINDINGS

Cardiovascular: Satisfactory opacification of the pulmonary arteries
to the segmental level. No evidence of pulmonary embolism. Mild
cardiomegaly is noted. No pericardial effusion. Atherosclerosis of
thoracic aorta is noted without aneurysm formation.

Mediastinum/Nodes: No enlarged mediastinal, hilar, or axillary lymph
nodes. Thyroid gland, trachea, and esophagus demonstrate no
significant findings.

Lungs/Pleura: No pneumothorax is noted. Status post left lower
lobectomy. Scarring and other postsurgical changes are noted
posteriorly. Small left pleural effusion is noted.

Musculoskeletal: Postsurgical changes are seen involving the left
ribs. Moderate thoracic kyphosis is noted. No acute osseous
abnormality is noted.

Review of the MIP images confirms the above findings.

CT ABDOMEN and PELVIS FINDINGS

Hepatobiliary: No focal liver abnormality is seen. No gallstones,
gallbladder wall thickening, or biliary dilatation.

Pancreas: Unremarkable. No pancreatic ductal dilatation or
surrounding inflammatory changes.

Spleen: Normal in size without focal abnormality.

Adrenals/Urinary Tract: Adrenal glands are unremarkable. Kidneys are
normal, without renal calculi, focal lesion, or hydronephrosis.
Bladder is unremarkable.

Stomach/Bowel: Stomach is within normal limits. Appendix appears
normal. No evidence of bowel wall thickening, distention, or
inflammatory changes.

Vascular/Lymphatic: Aortic atherosclerosis. No enlarged abdominal or
pelvic lymph nodes.

Reproductive: At least 2 uterine fibroids are noted. No adnexal
abnormality is noted.

Other: No abdominal wall hernia or abnormality. No abdominopelvic
ascites.

Musculoskeletal: No acute or significant osseous findings.

Review of the MIP images confirms the above findings.
IMPRESSION: 1. No definite evidence of pulmonary embolus.
2. Status post left lower lobectomy with postsurgical changes seen
posteriorly.
3. Small left pleural effusion is noted.
4. Aortic atherosclerosis.
5. At least 2 uterine fibroids are noted.
6. No acute abnormality seen in the abdomen or pelvis.

Aortic Atherosclerosis (SUTXO-ZGA.A).

## 2022-05-30 IMAGING — CT CT ANGIO CHEST
2 of 24 series · 8 of 48 positions shown · IV contrast (omnipaque)
Comparison: April 26, 2020.

CLINICAL DATA: High probability of pulmonary embolus. Left-sided
abdominal pain.

EXAM:
CT ANGIOGRAPHY CHEST
CT ABDOMEN AND PELVIS WITH CONTRAST
TECHNIQUE: Multidetector CT imaging of the chest was performed using the
standard protocol during bolus administration of intravenous
contrast. Multiplanar CT image reconstructions and MIPs were
obtained to evaluate the vascular anatomy. Multidetector CT imaging
of the abdomen and pelvis was performed using the standard protocol
during bolus administration of intravenous contrast.
CONTRAST:  100mL OMNIPAQUE IOHEXOL 350 MG/ML SOLN

[Series 11: thins · axial · 0.77mm/px · z∈[+1594,+1772]mm · 4 of 298 slices shown (1 of 2)]
[im 60/298  lung]
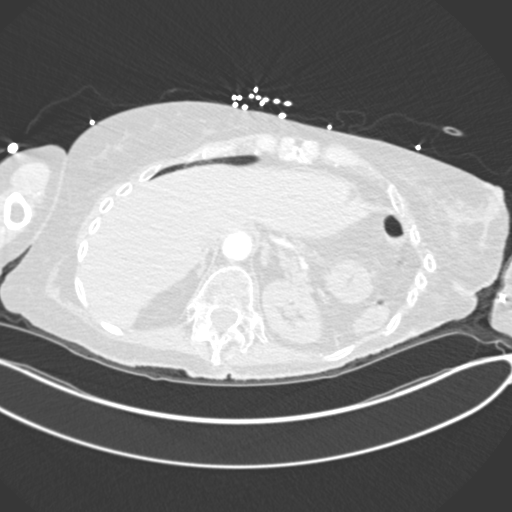
[im 119/298  lung]
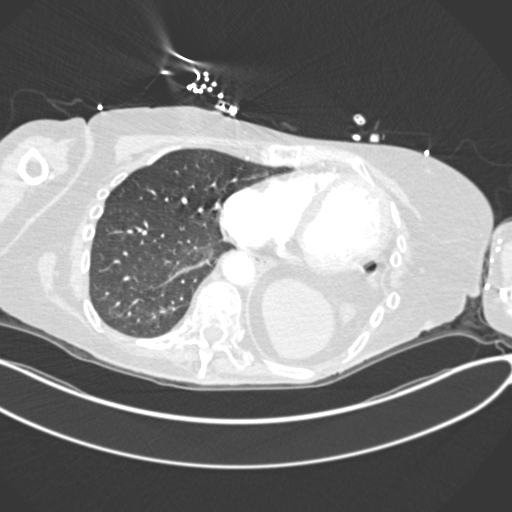
[im 179/298  lung]
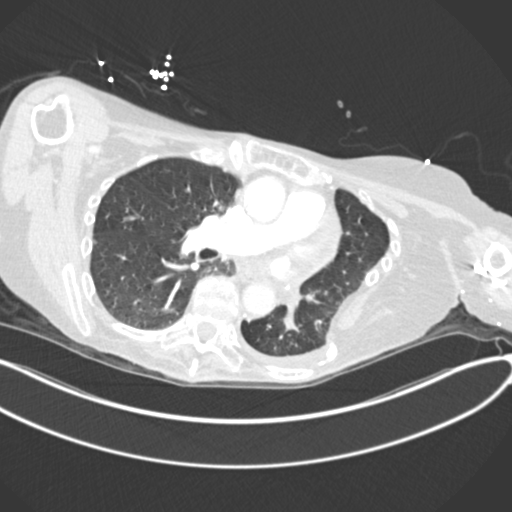
[im 238/298  lung]
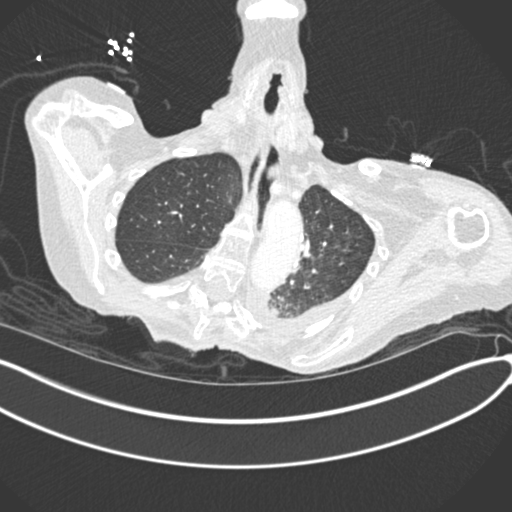

[Series 11: thins · axial · 0.77mm/px · z∈[+1594,+1772]mm · 4 of 298 slices shown (2 of 2)]
[im 60/298  lung]
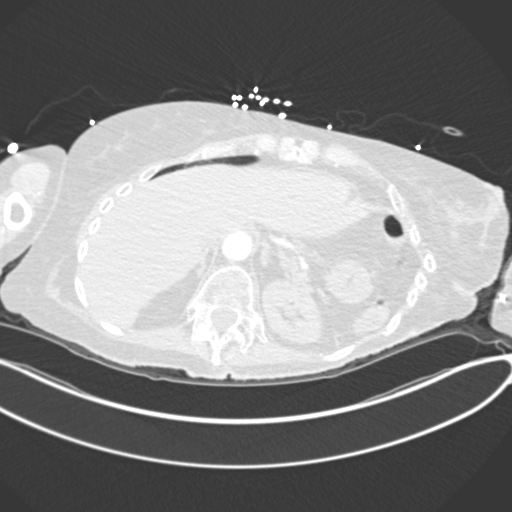
[im 119/298  soft-tissue]
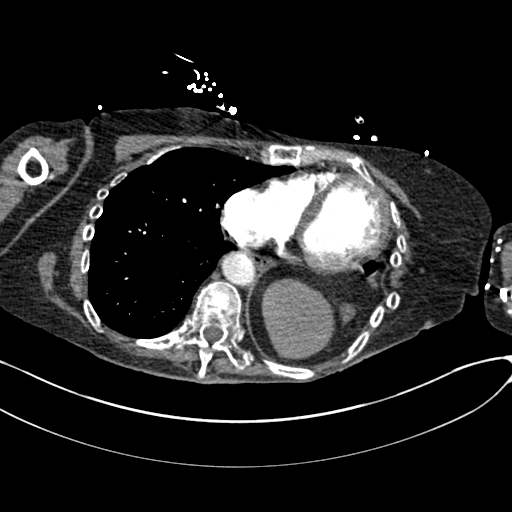
[im 179/298  lung]
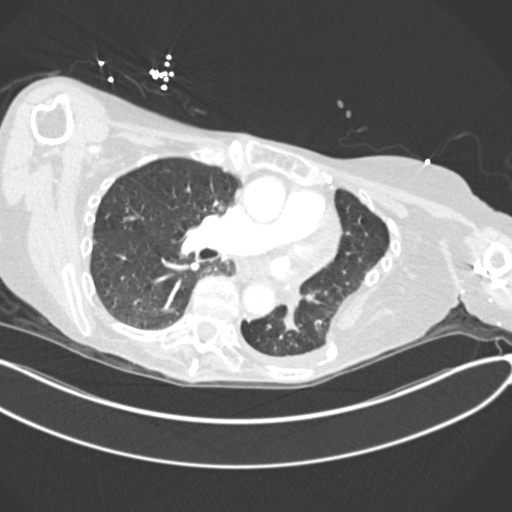
[im 238/298  soft-tissue]
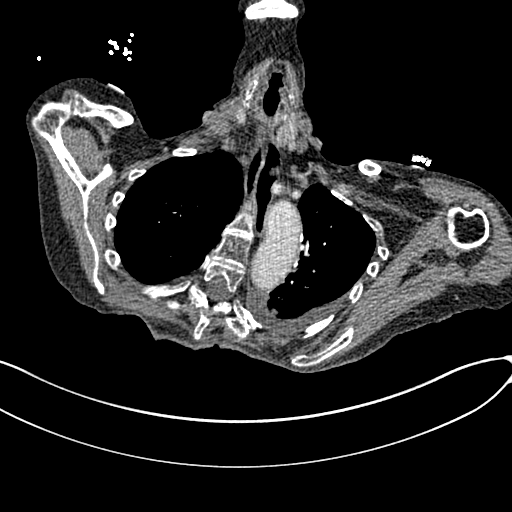

[8 of 48 positions shown; findings below may reference images not displayed]

FINDINGS: CTA CHEST FINDINGS

Cardiovascular: Satisfactory opacification of the pulmonary arteries
to the segmental level. No evidence of pulmonary embolism. Mild
cardiomegaly is noted. No pericardial effusion. Atherosclerosis of
thoracic aorta is noted without aneurysm formation.

Mediastinum/Nodes: No enlarged mediastinal, hilar, or axillary lymph
nodes. Thyroid gland, trachea, and esophagus demonstrate no
significant findings.

Lungs/Pleura: No pneumothorax is noted. Status post left lower
lobectomy. Scarring and other postsurgical changes are noted
posteriorly. Small left pleural effusion is noted.

Musculoskeletal: Postsurgical changes are seen involving the left
ribs. Moderate thoracic kyphosis is noted. No acute osseous
abnormality is noted.

Review of the MIP images confirms the above findings.

CT ABDOMEN and PELVIS FINDINGS

Hepatobiliary: No focal liver abnormality is seen. No gallstones,
gallbladder wall thickening, or biliary dilatation.

Pancreas: Unremarkable. No pancreatic ductal dilatation or
surrounding inflammatory changes.

Spleen: Normal in size without focal abnormality.

Adrenals/Urinary Tract: Adrenal glands are unremarkable. Kidneys are
normal, without renal calculi, focal lesion, or hydronephrosis.
Bladder is unremarkable.

Stomach/Bowel: Stomach is within normal limits. Appendix appears
normal. No evidence of bowel wall thickening, distention, or
inflammatory changes.

Vascular/Lymphatic: Aortic atherosclerosis. No enlarged abdominal or
pelvic lymph nodes.

Reproductive: At least 2 uterine fibroids are noted. No adnexal
abnormality is noted.

Other: No abdominal wall hernia or abnormality. No abdominopelvic
ascites.

Musculoskeletal: No acute or significant osseous findings.

Review of the MIP images confirms the above findings.
IMPRESSION: 1. No definite evidence of pulmonary embolus.
2. Status post left lower lobectomy with postsurgical changes seen
posteriorly.
3. Small left pleural effusion is noted.
4. Aortic atherosclerosis.
5. At least 2 uterine fibroids are noted.
6. No acute abnormality seen in the abdomen or pelvis.

Aortic Atherosclerosis (SUTXO-ZGA.A).

## 2022-07-06 ENCOUNTER — Encounter (HOSPITAL_COMMUNITY): Payer: Self-pay | Admitting: Emergency Medicine

## 2022-07-06 ENCOUNTER — Other Ambulatory Visit: Payer: Self-pay

## 2022-07-06 ENCOUNTER — Emergency Department (HOSPITAL_COMMUNITY): Payer: Medicare PPO

## 2022-07-06 ENCOUNTER — Emergency Department (HOSPITAL_COMMUNITY)
Admission: EM | Admit: 2022-07-06 | Discharge: 2022-07-06 | Disposition: A | Discharge status: Skilled Nursing Facility | Payer: Medicare PPO | Attending: Emergency Medicine | Admitting: Emergency Medicine

## 2022-07-06 DIAGNOSIS — R1030 Lower abdominal pain, unspecified: Secondary | ICD-10-CM | POA: Insufficient documentation

## 2022-07-06 DIAGNOSIS — K5641 Fecal impaction: Secondary | ICD-10-CM | POA: Insufficient documentation

## 2022-07-06 DIAGNOSIS — K5900 Constipation, unspecified: Secondary | ICD-10-CM

## 2022-07-06 LAB — CBC WITH DIFFERENTIAL/PLATELET
Abs Immature Granulocytes: 0.03 10*3/uL (ref 0.00–0.07)
Basophils Absolute: 0 10*3/uL (ref 0.0–0.1)
Basophils Relative: 0 %
Eosinophils Absolute: 0.1 10*3/uL (ref 0.0–0.5)
Eosinophils Relative: 1 %
HCT: 38.8 % (ref 36.0–46.0)
Hemoglobin: 11.9 g/dL — ABNORMAL LOW (ref 12.0–15.0)
Immature Granulocytes: 0 %
Lymphocytes Relative: 15 %
Lymphs Abs: 1.3 10*3/uL (ref 0.7–4.0)
MCH: 29.6 pg (ref 26.0–34.0)
MCHC: 30.7 g/dL (ref 30.0–36.0)
MCV: 96.5 fL (ref 80.0–100.0)
Monocytes Absolute: 0.4 10*3/uL (ref 0.1–1.0)
Monocytes Relative: 5 %
Neutro Abs: 6.9 10*3/uL (ref 1.7–7.7)
Neutrophils Relative %: 79 %
Platelets: 273 10*3/uL (ref 150–400)
RBC: 4.02 MIL/uL (ref 3.87–5.11)
RDW: 14.5 % (ref 11.5–15.5)
WBC: 8.8 10*3/uL (ref 4.0–10.5)
nRBC: 0 % (ref 0.0–0.2)

## 2022-07-06 LAB — COMPREHENSIVE METABOLIC PANEL
ALT: 12 U/L (ref 0–44)
AST: 16 U/L (ref 15–41)
Albumin: 3.8 g/dL (ref 3.5–5.0)
Alkaline Phosphatase: 73 U/L (ref 38–126)
Anion gap: 6 (ref 5–15)
BUN: 16 mg/dL (ref 8–23)
CO2: 23 mmol/L (ref 22–32)
Calcium: 9.1 mg/dL (ref 8.9–10.3)
Chloride: 106 mmol/L (ref 98–111)
Creatinine, Ser: 1.02 mg/dL — ABNORMAL HIGH (ref 0.44–1.00)
GFR, Estimated: 57 mL/min — ABNORMAL LOW (ref >60)
Glucose, Bld: 118 mg/dL — ABNORMAL HIGH (ref 70–99)
Potassium: 4.5 mmol/L (ref 3.5–5.1)
Sodium: 135 mmol/L (ref 135–145)
Total Bilirubin: 0.6 mg/dL (ref 0.3–1.2)
Total Protein: 7.1 g/dL (ref 6.5–8.1)

## 2022-07-06 LAB — LIPASE, BLOOD: Lipase: 37 U/L (ref 11–51)

## 2022-07-06 MED ORDER — POLYETHYLENE GLYCOL 3350 17 G PO PACK: 17.0000 g | PACK | Freq: Two times a day (BID) | ORAL | 0 refills | Status: DC

## 2022-07-06 MED ORDER — IOHEXOL 300 MG/ML  SOLN
100.0000 mL | Freq: Once | INTRAMUSCULAR | Status: AC | PRN
  Administered 2022-07-06: 100 mL via INTRAVENOUS

## 2022-07-06 MED ORDER — BISACODYL 10 MG RE SUPP: 10.0000 mg | RECTAL | 0 refills | Status: DC | PRN

## 2022-07-06 NOTE — ED Provider Notes (Signed)
Patient initially seen by Dr. Francia Greaves.  Please see his notes.  Rectal exam was performed by Dr. Hollace Kinnier.  Stool soft.  Patient did receive an enema here in the ED.  Clinical Course as of 07/06/22 1749  Fri Jul 06, 2022  1737 CT scan without acute findings other than constipation [JK]    Clinical Course User Index [JK] Dorie Rank, MD   CT scan did show large amount of stool in the rectum.  Asked patient if she wants another enema but she would prefer to go home at this point.  Will discharge home with laxative medications.   Dorie Rank, MD 07/06/22 [phone]

## 2022-07-06 NOTE — ED Triage Notes (Signed)
The patient presents from [facility] due to abdominal pain for the past 2 days. She has only had small hard bowel movements and believes she may be impacted.     EMS vitals: 122/86 BP 84 HR 16 RR 96 % SPO2 on room air

## 2022-07-06 NOTE — ED Provider Notes (Signed)
[facility] DEPT Provider Note   CSN: [id number] Arrival date & time: 07/06/22  1151     History  Chief Complaint  Patient presents with   Constipation    Valerie [name] is a 76 y.o. female.  76 year old female with prior medical history as detailed below presents for evaluation.  [facility] resides [facility].  Patient complains of lower abdominal discomfort.  She also complains of decreased significant bowel movement.  She complains of hard bowel movements.  She believes that she may have a stool impaction since she had this previously.  She denies associated nausea, vomiting, urinary symptoms.  The history is provided by the patient and medical records.       Home Medications Prior to Admission medications   Medication Sig Start Date End Date Taking? Authorizing Provider  ACETAMINOPHEN EXTRA STRENGTH 500 MG tablet Take 1,000 mg by mouth every 8 (eight) hours as needed for moderate pain. 08/28/21   [provider]  Ascorbic Acid (VITAMIN C) 100 MG tablet Take 100 mg by mouth every evening. Gummie    [provider]  cephALEXin (KEFLEX) 500 MG capsule Take 1 capsule (500 mg total) by mouth 2 (two) times daily. 01/03/22   Sherrell Puller, PA-C  donepezil (ARICEPT) 10 MG tablet TAKE 1 TABLET BY MOUTH ONCE DAILY IN THE EVENING Patient taking differently: Take 10 mg by mouth every evening. 04/03/21   [name], MD  feeding supplement (ENSURE ENLIVE / ENSURE PLUS) LIQD Take 237 mLs by mouth 2 (two) times daily between meals. 10/04/21   Shelly Coss, MD  Hydrocortisone, Perianal, 1 % CREA Apply 1 g topically 2 (two) times daily. 01/02/22   [provider]  hyoscyamine (LEVSIN) 0.125 MG tablet SMARTSIG:1 Tablet(s) By Mouth Every 12 Hours Patient not taking: Reported on 01/03/2022 01/02/22   [provider]  loperamide (IMODIUM) 2 MG capsule Take 2 capsules (4 mg total) by mouth every 8 (eight) hours as needed  (diarrhea). 10/04/21   Shelly Coss, MD  Magnesium 200 MG TABS Take 200 mg by mouth daily.    [provider]  Multiple Vitamin (MULTI-VITAMINS) TABS Take 1 tablet by mouth every evening.     [provider]  NON FORMULARY Apply 1 application topically every 4 (four) hours as needed (for rash to buttocks).    [provider]  ondansetron (ZOFRAN) 4 MG tablet Take 1 tablet (4 mg total) by mouth every 8 (eight) hours as needed for nausea or vomiting. 10/04/21   Shelly Coss, MD  PREPARATION H 0.25-14-74.9 % rectal ointment Place 1 application. rectally 2 (two) times daily as needed for hemorrhoids. 10/27/21   [provider]  prochlorperazine (COMPAZINE) 10 MG tablet Take 1 tablet (10 mg total) by mouth every 6 (six) hours as needed for nausea or vomiting. 05/30/20   Curt Bears, MD  saccharomyces boulardii (FLORASTOR) 250 MG capsule Take 1 capsule (250 mg total) by mouth 2 (two) times daily. 10/04/21   Shelly Coss, MD  vitamin B-12 (CYANOCOBALAMIN) 1000 MCG tablet Take 1,000 mcg by mouth every evening.    [provider]  vitamin C (ASCORBIC ACID) 250 MG tablet Take 250 mg by mouth daily. 11/09/21   [provider]  Zinc Oxide (DESITIN) 13 % CREA Apply 1 application topically 2 (two) times daily. Patient taking differently: Apply 1 application. topically 2 (two) times daily. To groin area 09/20/21   Drenda Freeze, MD      Allergies  Broccoli [brassica oleracea], Fruit extracts, and Sulfa antibiotics    Review of Systems   Review of Systems  All other systems reviewed and are negative.   Physical Exam Updated Vital Signs BP 114/61   Pulse 84   Temp (!) 97.4 F (36.3 C) (Oral)   Resp (!) 26   SpO2 97%  Physical Exam Vitals and nursing note reviewed.  Constitutional:      General: She is not in acute distress.    Appearance: Normal appearance. She is well-developed.  HENT:     Head: Normocephalic and atraumatic.   Eyes:     Conjunctiva/sclera: Conjunctivae normal.     Pupils: Pupils are equal, round, and reactive to light.  Cardiovascular:     Rate and Rhythm: Normal rate and regular rhythm.     Heart sounds: Normal heart sounds.  Pulmonary:     Effort: Pulmonary effort is normal. No respiratory distress.     Breath sounds: Normal breath sounds.  Abdominal:     General: There is no distension.     Palpations: Abdomen is soft.     Tenderness: There is no abdominal tenderness.     Comments: Localizes tenderness to the low suprapubic area.  Patient without significant tenderness with palpation.  Genitourinary:    Comments: Rectal exam reveals soft stool without evidence of impaction.  Stool is brown without evidence of melena or gross blood. Musculoskeletal:        General: No deformity. Normal range of motion.     Cervical back: Normal range of motion and neck supple.  Skin:    General: Skin is warm and dry.  Neurological:     General: No focal deficit present.     Mental Status: She is alert and oriented to person, place, and time.     ED Results / Procedures / Treatments   Labs (all labs ordered are listed, but only abnormal results are displayed) Labs Reviewed  CBC WITH DIFFERENTIAL/PLATELET - Abnormal; Notable for the following components:      Result Value   Hemoglobin 11.9 (*)    All other components within normal limits  COMPREHENSIVE METABOLIC PANEL - Abnormal; Notable for the following components:   Glucose, Bld 118 (*)    Creatinine, Ser 1.02 (*)    GFR, Estimated 57 (*)    All other components within normal limits  LIPASE, BLOOD    EKG None  Radiology No results found.  Procedures Procedures    Medications Ordered in ED Medications - No data to display  ED Course/ Medical Decision Making/ A&P Clinical Course as of 07/15/22 1846  Fri Jul 06, 2022  1737 CT scan without acute findings other than constipation [JK]    Clinical Course User Index [JK] Dorie Rank, MD                           Medical Decision Making Amount and/or Complexity of Data Reviewed Labs: ordered. Radiology: ordered.  Risk OTC drugs. Prescription drug management.    Medical Screen Complete  This patient presented to the ED with complaint of abdominal pain, concern for possible constipation/impaction.  This complaint involves an extensive number of treatment options. The initial differential diagnosis includes, but is not limited to, stool impaction constipation, metabolic abnormality, UTI, other acute intra-abdominal pathology  This presentation is: Acute, Self-Limited, Previously Undiagnosed, Uncertain Prognosis, Complicated, Systemic Symptoms, and Threat to Life/Bodily Function  Patient is presenting with approximately 2 days  of lower abdominal discomfort.  Patient initially was concerned about possible stool impaction since she has had these previously.  DRE reveals soft not impacted stool.  Screening labs and CT imaging of the abdomen pelvis ordered.  Pending studies and disposition signed out to oncoming EDP.  Additional history obtained:  External records from outside sources obtained and reviewed including prior ED visits and prior Inpatient records.    Lab Tests:  I ordered and personally interpreted labs.  The pertinent results include:  CBC CMP LIPASE UA   Imaging Studies ordered:  I ordered imaging studies including CT AP  I agree with the radiologist interpretation.   Medicines ordered:  I ordered medication including enema  for constipation  Reevaluation of the patient after these medicines showed that the patient: improved  Problem List / ED Course:  Abdominal pain   Reevaluation:  After the interventions noted above, I reevaluated the patient and found that they have: stayed the same  Disposition:  After consideration of the diagnostic results and the patients response to treatment, I feel that the patent would benefit  from completion of ED evaluation.          Final Clinical Impression(s) / ED Diagnoses Final diagnoses:  Lower abdominal pain  Constipation, unspecified constipation type  Fecal impaction in rectum [facility])    Rx / DC Orders ED Discharge Orders     None         Valarie Merino, MD 07/15/22 1846

## 2022-07-06 NOTE — Discharge Instructions (Signed)
Take the medications as prescribed to help with your constipation.  You can also try taking over-the-counter enemas to help with your constipation.  Follow-up with your primary care doctor to be rechecked.

## 2022-07-20 ENCOUNTER — Non-Acute Institutional Stay: Payer: Medicare PPO | Admitting: Hospice

## 2022-07-20 DIAGNOSIS — Z515 Encounter for palliative care: Secondary | ICD-10-CM

## 2022-07-20 DIAGNOSIS — F0150 Vascular dementia without behavioral disturbance: Secondary | ICD-10-CM

## 2022-07-20 DIAGNOSIS — K5901 Slow transit constipation: Secondary | ICD-10-CM

## 2022-07-20 DIAGNOSIS — C799 Secondary malignant neoplasm of unspecified site: Secondary | ICD-10-CM

## 2022-07-20 DIAGNOSIS — R531 Weakness: Secondary | ICD-10-CM

## 2022-07-20 NOTE — Progress Notes (Signed)
[facility] Palliative Care Consult Note Telephone: [phone]  Fax: [phone]  PATIENT NAME: Valerie [name] [id number] [phone] (home) [phone] (work) DOB: 01/28/1946 MRN: [id number]  PRIMARY CARE PROVIDER:    Pcp, No,  No address on file None  REFERRING PROVIDER:   Dr Reymundo [name]  RESPONSIBLE PARTY:   Valerie [name] - daughter Valerie [name] - friend [place] - Fri  Contact Information     Name Relation Home Work [place] Daughter [phone]  [phone]   [facility] Daughter [phone]  [phone]   Valerie [name] Sister [phone]  [phone]        I met face to face with patient at facility. Visit to build trust and highlight Palliative Medicine as specialized medical care for people living with serious illness, aimed at facilitating better quality of life through symptoms relief, assisting with advance care planning and complex medical decision making.   ASSESSMENT AND / RECOMMENDATIONS:   CODE STATUS: Patient is a Do Not Resuscitate.   Goals of Care: Goals include to maximize quality of life and symptom management. She also said that family will be interested in hospice service in the future.  Visit consisted of counseling and education dealing with the complex and emotionally intense issues of symptom management and palliative care in the setting of serious and potentially life-threatening illness. Palliative care team will continue to support patient, patient's family, and medical team.  Symptom Management/Plan: Dementia: Continue Donepezil. Incontinent of bowel and bladder, memory loss/confusion.  Fast 6D.  Fall and safety precautions. Encourage reminiscence, word search/puzzles, cueing for recollection.  Continue Aricept as ordered.  Optimize ongoing supportive care. Metastatic lung CA: No more chemo/radiation.  Continue ongoing supportive care Weakness:  PT/OT is ongoing; self propels in wheelchair, max assist for transfers.  Constipation: Managed with Miralax Follow up: Palliative care will continue to follow for complex medical decision making, advance care planning, and clarification of goals. Return 6 weeks or prn. Encouraged to call provider sooner with any concerns.   Family /Caregiver/Community Supports: Patient in assisted living for ongoing care.  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  HOSPICE ELIGIBILITY/DIAGNOSIS: TBD  Chief Complaint: Follow-up visit  HISTORY OF PRESENT ILLNESS:  Valerie [name] is a 76 y.o. 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  Patient continues to decline in cognitive and functional status. History obtained from review of EMR, discussion with primary team, caregiver, family and/or Valerie [name].  Review and summarization of Epic records shows history from other than patient. Rest of 10 point ROS asked and negative.  I reviewed as needed, available labs, patient records, imaging, studies and related documents from the EMR  PAST MEDICAL HISTORY:  Active Ambulatory Problems    Diagnosis Date Noted   Primary cancer of right upper lobe of lung ([place]) 04/22/2009   Malignant neoplasm of brain ([place]) 04/22/2009   VITAMIN D DEFICIENCY 04/22/2009   ANAL FISTULA 04/22/2009   LESION, ANUS 04/26/2009   OSTEOPOROSIS 04/22/2009   DIARRHEA 04/26/2009   Lung metastasis    Closed left ankle fracture 10/01/2014   Right fibular fracture 10/01/2014   Ankle fracture 10/02/2014   Ankle fracture 10/02/2014   Lung cancer ([place]) 10/02/2014   Closed fibular fracture 10/02/2014   Dementia ([place]) 10/02/2014  Lower urinary tract infectious disease 10/02/2014   Reactive airway disease 10/02/2014   Fall  10/02/2014   Fracture of right proximal fibula    Urinary retention 10/20/2014   Ileus ([place]) 10/20/2014   Cellulitis of leg, right 10/20/2014   Encounter for antineoplastic chemotherapy 01/31/2015   Cough 08/24/2015   Chronic diarrhea 08/24/2015   Sepsis ([place])    Headache 07/24/2016   Blurry vision 01/06/2019   Closed fracture of distal end of right femur, initial encounter ([place]) 06/11/2019   Closed fracture of lateral portion of right tibial plateau 06/11/2019   History of DVT of lower extremity 06/11/2019   Acute blood loss anemia 06/12/2019   Pyelonephritis 09/10/2019   Congestive heart failure (CHF) ([place]) 02/14/2020   Acute combined systolic and diastolic heart failure (HCC)    Elevated troponin    Dilated cardiomyopathy ([place])    Nonocclusive coronary atherosclerosis of native coronary artery    AMS (altered mental status) 03/23/2020   Hypokalemia 03/23/2020   Prolonged Q-T interval on ECG 03/23/2020   DNR (do not resuscitate) 03/23/2020   MRSA bacteremia 03/23/2020   Normocytic anemia 03/24/2020   Urinary tract infection without hematuria    SVT (supraventricular tachycardia) 03/26/2020   Pressure injury of skin 06/03/2020   Cellulitis 06/03/2020   Rash and nonspecific skin eruption    Metastatic malignant neoplasm (HCC)    Failure to thrive in adult    Asymptomatic bacteriuria    Malnutrition of moderate degree 97/74/1423   Complicated UTI (urinary tract infection) 04/17/2021   Atherosclerosis of aorta ([place]) 05/11/2021   PAF (paroxysmal atrial fibrillation) ([place]) 07/07/2021   History of gross hematuria 07/07/2021   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/12/2021   Dysarthria 95/32/0233   Acute metabolic encephalopathy 43/56/8616   UTI (urinary tract infection) 10/01/2021   COVID-19 virus infection 10/01/2021   Resolved Ambulatory Problems    Diagnosis Date Noted   Dementia ([place]) 04/22/2009   DVT, lower extremity ([place]) 10/20/2014   SIRS (systemic inflammatory  response syndrome) ([place]) 08/24/2015   HCAP (healthcare-associated pneumonia) 08/24/2015   Influenza A 08/25/2015   Past Medical History:  Diagnosis Date   Abnormal Pap smear 2006   Anxiety    Arthritis    Atrophic vaginitis 2008   Cataract    Dyspareunia 2008   H/O osteoporosis    H/O varicella    H/O vitamin D deficiency    Heart murmur    History of measles, mumps, or rubella    History of radiation therapy 07/28/13- 08/10/13   Hypertension    Metastasis to brain [facility]) dx'd 2008   Nodule of right lung CT- 06/03/12   Nodule of right lung 06/03/12   On antineoplastic chemotherapy    Osteoporosis 2010   Shortness of breath    Status post chemotherapy 2003   Status post radiation therapy 2003   Status post radiation therapy 11/07/2005   Status post radiation therapy 06/02/2008   Thyroid adenoma    Thyroid cancer ([place]) 10/18/11 bx   Yeast infection     SOCIAL HX:  Social History   Tobacco Use   Smoking status: Never   Smokeless tobacco: Never   Tobacco comments:    smoked few years in college  Substance Use Topics   Alcohol use: No     FAMILY HX:  Family History  Problem Relation Age of Onset   Gait disorder Mother    Cancer Mother        Meningioma   Cancer Father  Pancreatic   Heart failure Sister    Cancer Maternal Aunt        menigeoma      ALLERGIES:  Allergies  Allergen Reactions   Broccoli [Brassica Oleracea] Other (See Comments)    unknown   Fruit Extracts Other (See Comments)    unknown   Sulfa Antibiotics Other (See Comments)    She cant remember  what happens      PERTINENT MEDICATIONS:  Outpatient Encounter Medications as of 07/20/2022  Medication Sig   ACETAMINOPHEN EXTRA STRENGTH 500 MG tablet Take 1,000 mg by mouth every 8 (eight) hours as needed for moderate pain.   Ascorbic Acid (VITAMIN C) 100 MG tablet Take 100 mg by mouth every evening. Gummie   bisacodyl (DULCOLAX) 10 MG suppository Place 1 suppository (10 mg total)  rectally as needed for moderate constipation.   cephALEXin (KEFLEX) 500 MG capsule Take 1 capsule (500 mg total) by mouth 2 (two) times daily.   donepezil (ARICEPT) 10 MG tablet TAKE 1 TABLET BY MOUTH ONCE DAILY IN THE EVENING (Patient taking differently: Take 10 mg by mouth every evening.)   feeding supplement (ENSURE ENLIVE / ENSURE PLUS) LIQD Take 237 mLs by mouth 2 (two) times daily between meals.   Hydrocortisone, Perianal, 1 % CREA Apply 1 g topically 2 (two) times daily.   hyoscyamine (LEVSIN) 0.125 MG tablet SMARTSIG:1 Tablet(s) By Mouth Every 12 Hours (Patient not taking: Reported on 01/03/2022)   loperamide (IMODIUM) 2 MG capsule Take 2 capsules (4 mg total) by mouth every 8 (eight) hours as needed (diarrhea).   Magnesium 200 MG TABS Take 200 mg by mouth daily.   Multiple Vitamin (MULTI-VITAMINS) TABS Take 1 tablet by mouth every evening.    NON FORMULARY Apply 1 application topically every 4 (four) hours as needed (for rash to buttocks).   ondansetron (ZOFRAN) 4 MG tablet Take 1 tablet (4 mg total) by mouth every 8 (eight) hours as needed for nausea or vomiting.   polyethylene glycol (MIRALAX) 17 g packet Take 17 g by mouth 2 (two) times daily.   PREPARATION H 0.25-14-74.9 % rectal ointment Place 1 application. rectally 2 (two) times daily as needed for hemorrhoids.   prochlorperazine (COMPAZINE) 10 MG tablet Take 1 tablet (10 mg total) by mouth every 6 (six) hours as needed for nausea or vomiting.   saccharomyces boulardii (FLORASTOR) 250 MG capsule Take 1 capsule (250 mg total) by mouth 2 (two) times daily.   vitamin B-12 (CYANOCOBALAMIN) 1000 MCG tablet Take 1,000 mcg by mouth every evening.   vitamin C (ASCORBIC ACID) 250 MG tablet Take 250 mg by mouth daily.   Zinc Oxide (DESITIN) 13 % CREA Apply 1 application topically 2 (two) times daily. (Patient taking differently: Apply 1 application. topically 2 (two) times daily. To groin area)   No facility-administered encounter medications  on file as of 07/20/2022.   I spent 45 minutes providing this consultation; this includes time spent with patient/family, chart review and documentation. More than 50% of the time in this consultation was spent on counseling and coordinating communication.   Thank you for the opportunity to participate in the care of Ms. Tomassetti.  The palliative care team will continue to follow. Please call our office at [phone] if we can be of additional assistance.   Note: Portions of this note were generated with Lobbyist. Dictation errors may occur despite best attempts at proofreading.  Teodoro Spray, NP

## 2022-08-21 ENCOUNTER — Non-Acute Institutional Stay: Payer: Medicare PPO | Admitting: Hospice

## 2022-08-21 DIAGNOSIS — C799 Secondary malignant neoplasm of unspecified site: Secondary | ICD-10-CM

## 2022-08-21 DIAGNOSIS — F0150 Vascular dementia without behavioral disturbance: Secondary | ICD-10-CM

## 2022-08-21 DIAGNOSIS — R531 Weakness: Secondary | ICD-10-CM

## 2022-08-21 DIAGNOSIS — K5901 Slow transit constipation: Secondary | ICD-10-CM

## 2022-08-21 DIAGNOSIS — Z515 Encounter for palliative care: Secondary | ICD-10-CM

## 2022-08-21 NOTE — Progress Notes (Signed)
[facility] Palliative Care Consult Note Telephone: [phone]  Fax: [phone]  PATIENT NAME: Valerie [name] [phone] (home) [phone] (work) DOB: 1946/05/20 MRN: [id number]  PRIMARY CARE PROVIDER:    Pcp, No,  No address on file None  REFERRING PROVIDER:   Dr Reymundo Poll  RESPONSIBLE PARTY:   [name] - daughter Debe Coder - friend [place] - Fri  Contact Information     Name Relation Home Work [place] Daughter [phone]  [phone]   [facility] Daughter [phone]  [phone]   Rayna Sexton Sister [phone]  [phone]        I met face to face with patient at facility. Visit to build trust and highlight Palliative Medicine as specialized medical care for people living with serious illness, aimed at facilitating better quality of life through symptoms relief, assisting with advance care planning and complex medical decision making.  Personal caregiver present during part of visit.  ASSESSMENT AND / RECOMMENDATIONS:   CODE STATUS: Patient is a Do Not Resuscitate.   Goals of Care: Goals include to maximize quality of life and symptom management. She also said that family will be interested in hospice service in the future.  Visit consisted of counseling and education dealing with the complex and emotionally intense issues of symptom management and palliative care in the setting of serious and potentially life-threatening illness. Palliative care team will continue to support patient, patient's family, and medical team.  Symptom Management/Plan: Dementia: Incontinent of bowel and bladder, memory loss/confusion.  Fast 6D. Continue Donepezil.  Fall and safety precautions. Encourage reminiscence, word search/puzzles, cueing for recollection.  Metastatic lung CA: No more chemo/radiation.  Continue ongoing supportive care   Weakness: PT/OT is  ongoing for mobility, transfers and strengthening;  self propels in wheelchair, max assist for transfers.  Constipation: Managed with Miralax Follow up: Palliative care will continue to follow for complex medical decision making, advance care planning, and clarification of goals. Return 6 weeks or prn. Encouraged to call provider sooner with any concerns.   Family /Caregiver/Community Supports: Patient in assisted living for ongoing care.  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  HOSPICE ELIGIBILITY/DIAGNOSIS: TBD  Chief Complaint: Follow-up visit  HISTORY OF PRESENT ILLNESS:  Valerie [name] is a 77 y.o. 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  History obtained from review of EMR, discussion with primary team, caregiver, family and/or [name].  Review and summarization of Epic records shows history from other than patient. Rest of 10 point ROS asked and negative.  I reviewed as needed, available labs, patient records, imaging, studies and related documents from the EMR  PAST MEDICAL HISTORY:  Active Ambulatory Problems    Diagnosis Date Noted   Primary cancer of right upper lobe of lung ([place]) 04/22/2009   Malignant neoplasm of brain ([place]) 04/22/2009   VITAMIN D DEFICIENCY 04/22/2009   ANAL FISTULA 04/22/2009   LESION, ANUS 04/26/2009   OSTEOPOROSIS 04/22/2009   DIARRHEA 04/26/2009   Lung metastasis    Closed left ankle fracture 10/01/2014   Right fibular fracture 10/01/2014   Ankle fracture 10/02/2014   Ankle fracture 10/02/2014   Lung cancer ([place]) 10/02/2014   Closed fibular fracture 10/02/2014   Dementia ([place]) 10/02/2014  Lower urinary tract infectious disease 10/02/2014   Reactive airway disease 10/02/2014   Fall 10/02/2014   Fracture of  right proximal fibula    Urinary retention 10/20/2014   Ileus ([place]) 10/20/2014   Cellulitis of leg, right 10/20/2014   Encounter for antineoplastic chemotherapy 01/31/2015   Cough 08/24/2015   Chronic diarrhea 08/24/2015   Sepsis ([place])    Headache 07/24/2016   Blurry vision 01/06/2019   Closed fracture of distal end of right femur, initial encounter ([place]) 06/11/2019   Closed fracture of lateral portion of right tibial plateau 06/11/2019   History of DVT of lower extremity 06/11/2019   Acute blood loss anemia 06/12/2019   Pyelonephritis 09/10/2019   Congestive heart failure (CHF) ([place]) 02/14/2020   Acute combined systolic and diastolic heart failure (HCC)    Elevated troponin    Dilated cardiomyopathy ([place])    Nonocclusive coronary atherosclerosis of native coronary artery    AMS (altered mental status) 03/23/2020   Hypokalemia 03/23/2020   Prolonged Q-T interval on ECG 03/23/2020   DNR (do not resuscitate) 03/23/2020   MRSA bacteremia 03/23/2020   Normocytic anemia 03/24/2020   Urinary tract infection without hematuria    SVT (supraventricular tachycardia) 03/26/2020   Pressure injury of skin 06/03/2020   Cellulitis 06/03/2020   Rash and nonspecific skin eruption    Metastatic malignant neoplasm (HCC)    Failure to thrive in adult    Asymptomatic bacteriuria    Malnutrition of moderate degree 35/45/6256   Complicated UTI (urinary tract infection) 04/17/2021   Atherosclerosis of aorta ([place]) 05/11/2021   PAF (paroxysmal atrial fibrillation) ([place]) 07/07/2021   History of gross hematuria 07/07/2021   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   Dysarthria 38/93/7342   Acute metabolic encephalopathy 87/68/1157   UTI (urinary tract infection) 10/01/2021   COVID-19 virus infection 10/01/2021   Resolved Ambulatory Problems    Diagnosis Date Noted   Dementia ([place]) 04/22/2009   DVT, lower extremity ([place]) 10/20/2014   SIRS (systemic inflammatory response syndrome) ([place])  08/24/2015   HCAP (healthcare-associated pneumonia) 08/24/2015   Influenza A 08/25/2015   Past Medical History:  Diagnosis Date   Abnormal Pap smear 2006   Anxiety    Arthritis    Atrophic vaginitis 2008   Cataract    Dyspareunia 2008   H/O osteoporosis    H/O varicella    H/O vitamin D deficiency    Heart murmur    History of measles, mumps, or rubella    History of radiation therapy 07/28/13- 08/10/13   Hypertension    Metastasis to brain [facility]) dx'd 2008   Nodule of right lung CT- 06/03/12   Nodule of right lung 06/03/12   On antineoplastic chemotherapy    Osteoporosis 2010   Shortness of breath    Status post chemotherapy 2003   Status post radiation therapy 2003   Status post radiation therapy 11/07/2005   Status post radiation therapy 06/02/2008   Thyroid adenoma    Thyroid cancer ([place]) 10/18/11 bx   Yeast infection     SOCIAL HX:  Social History   Tobacco Use   Smoking status: Never   Smokeless tobacco: Never   Tobacco comments:    smoked few years in college  Substance Use Topics   Alcohol use: No     FAMILY HX:  Family History  Problem Relation Age of Onset   Gait disorder Mother    Cancer Mother        Meningioma   Cancer Father  Pancreatic   Heart failure Sister    Cancer Maternal Aunt        menigeoma      ALLERGIES:  Allergies  Allergen Reactions   Broccoli [Brassica Oleracea] Other (See Comments)    unknown   Fruit Extracts Other (See Comments)    unknown   Sulfa Antibiotics Other (See Comments)    She cant remember  what happens      PERTINENT MEDICATIONS:  Outpatient Encounter Medications as of 08/21/2022  Medication Sig   ACETAMINOPHEN EXTRA STRENGTH 500 MG tablet Take 1,000 mg by mouth every 8 (eight) hours as needed for moderate pain.   Ascorbic Acid (VITAMIN C) 100 MG tablet Take 100 mg by mouth every evening. Gummie   bisacodyl (DULCOLAX) 10 MG suppository Place 1 suppository (10 mg total) rectally as needed for moderate  constipation.   cephALEXin (KEFLEX) 500 MG capsule Take 1 capsule (500 mg total) by mouth 2 (two) times daily.   donepezil (ARICEPT) 10 MG tablet TAKE 1 TABLET BY MOUTH ONCE DAILY IN THE EVENING (Patient taking differently: Take 10 mg by mouth every evening.)   feeding supplement (ENSURE ENLIVE / ENSURE PLUS) LIQD Take 237 mLs by mouth 2 (two) times daily between meals.   Hydrocortisone, Perianal, 1 % CREA Apply 1 g topically 2 (two) times daily.   hyoscyamine (LEVSIN) 0.125 MG tablet SMARTSIG:1 Tablet(s) By Mouth Every 12 Hours (Patient not taking: Reported on 01/03/2022)   loperamide (IMODIUM) 2 MG capsule Take 2 capsules (4 mg total) by mouth every 8 (eight) hours as needed (diarrhea).   Magnesium 200 MG TABS Take 200 mg by mouth daily.   Multiple Vitamin (MULTI-VITAMINS) TABS Take 1 tablet by mouth every evening.    NON FORMULARY Apply 1 application topically every 4 (four) hours as needed (for rash to buttocks).   ondansetron (ZOFRAN) 4 MG tablet Take 1 tablet (4 mg total) by mouth every 8 (eight) hours as needed for nausea or vomiting.   polyethylene glycol (MIRALAX) 17 g packet Take 17 g by mouth 2 (two) times daily.   PREPARATION H 0.25-14-74.9 % rectal ointment Place 1 application. rectally 2 (two) times daily as needed for hemorrhoids.   prochlorperazine (COMPAZINE) 10 MG tablet Take 1 tablet (10 mg total) by mouth every 6 (six) hours as needed for nausea or vomiting.   saccharomyces boulardii (FLORASTOR) 250 MG capsule Take 1 capsule (250 mg total) by mouth 2 (two) times daily.   vitamin B-12 (CYANOCOBALAMIN) 1000 MCG tablet Take 1,000 mcg by mouth every evening.   vitamin C (ASCORBIC ACID) 250 MG tablet Take 250 mg by mouth daily.   Zinc Oxide (DESITIN) 13 % CREA Apply 1 application topically 2 (two) times daily. (Patient taking differently: Apply 1 application. topically 2 (two) times daily. To groin area)   No facility-administered encounter medications on file as of 08/21/2022.   I  spent 45 minutes providing this consultation; this includes time spent with patient/family, chart review and documentation. More than 50% of the time in this consultation was spent on counseling and coordinating communication.   Thank you for the opportunity to participate in the care of Ms. Rosenbloom.  The palliative care team will continue to follow. Please call our office at [phone] if we can be of additional assistance.   Note: Portions of this note were generated with Lobbyist. Dictation errors may occur despite best attempts at proofreading.  Teodoro Spray, NP

## 2022-08-26 IMAGING — CT CT CHEST W/ CM
2 of 5 series · 12 of 36 positions shown, 15 images · IV contrast (OMNIPAQUE)
Comparison: 06/03/2020

CLINICAL DATA: Non-small-cell lung cancer.  Restaging.

EXAM:
CT CHEST, ABDOMEN, AND PELVIS WITH CONTRAST
TECHNIQUE: Multidetector CT imaging of the chest, abdomen and pelvis was
performed following the standard protocol during bolus
administration of intravenous contrast.
CONTRAST:  100mL OMNIPAQUE IOHEXOL 300 MG/ML  SOLN

[Series 2: cap with · axial · 0.80mm/px · z∈[+1122,+1632]mm · 9 of 126 slices shown, 12 images]
[im 12/126  mediastinal]
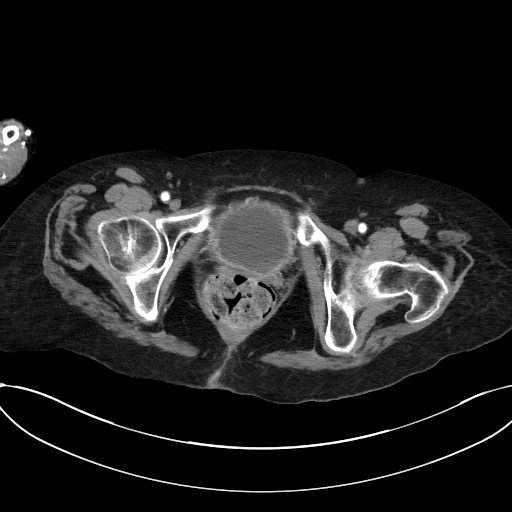
[im 12/126  lung]
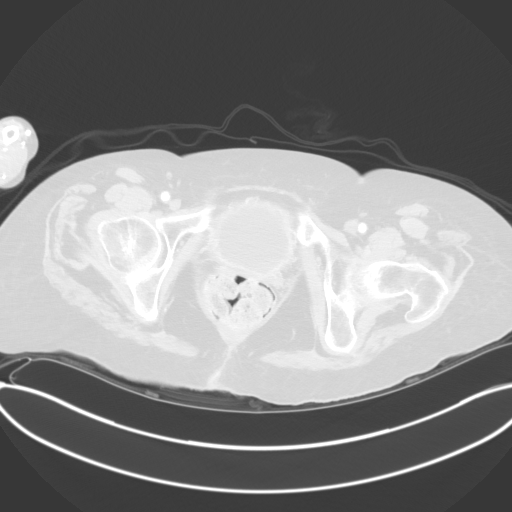
[im 23/126  lung]
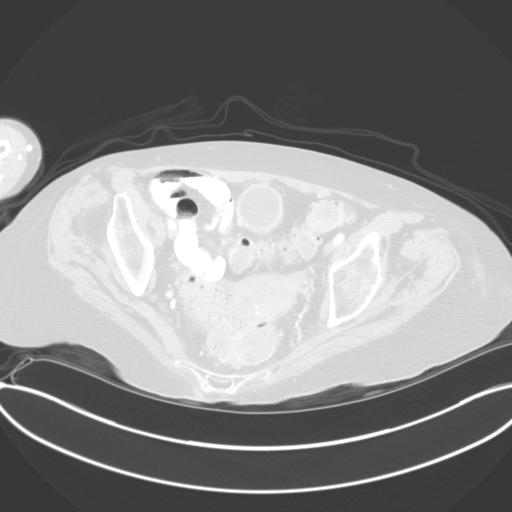
[im 35/126  lung]
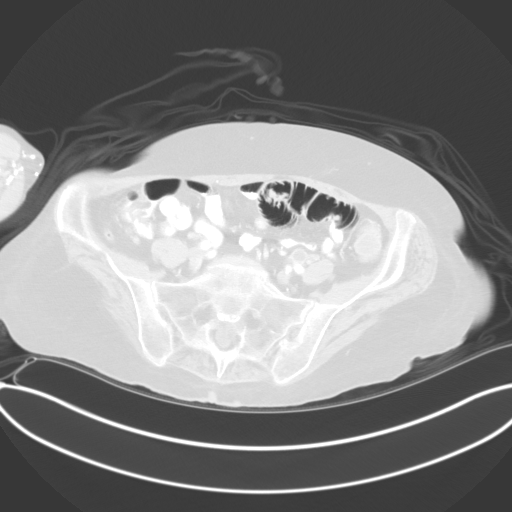
[im 46/126  lung]
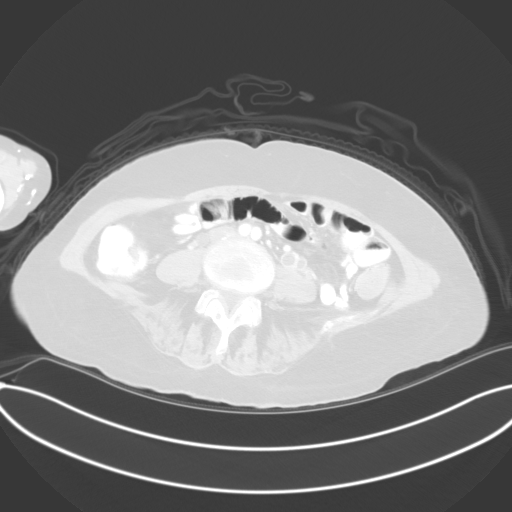
[im 69/126  mediastinal]
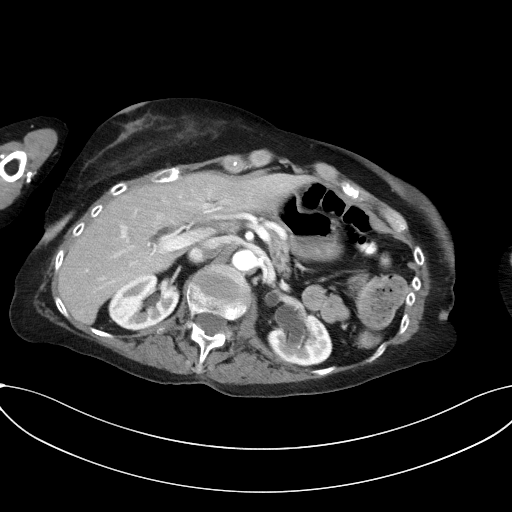
[im 69/126  lung]
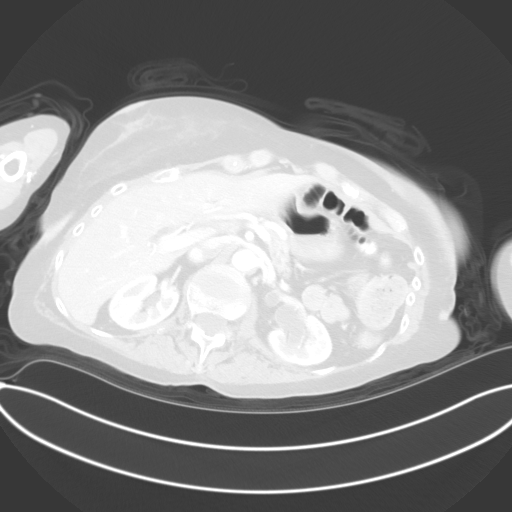
[im 80/126  lung]
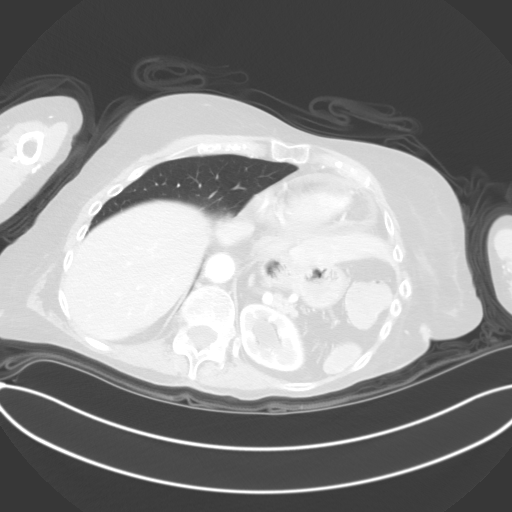
[im 91/126  lung]
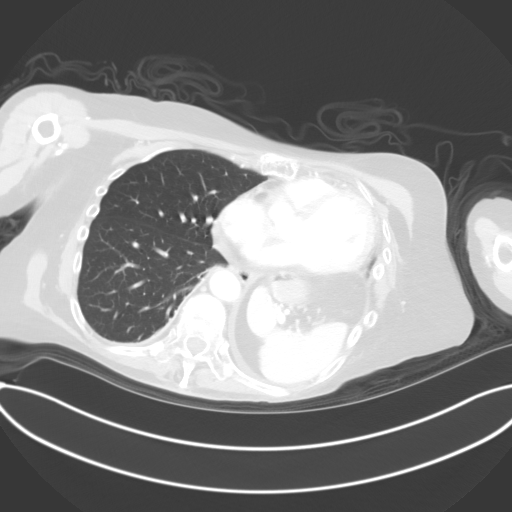
[im 103/126  lung]
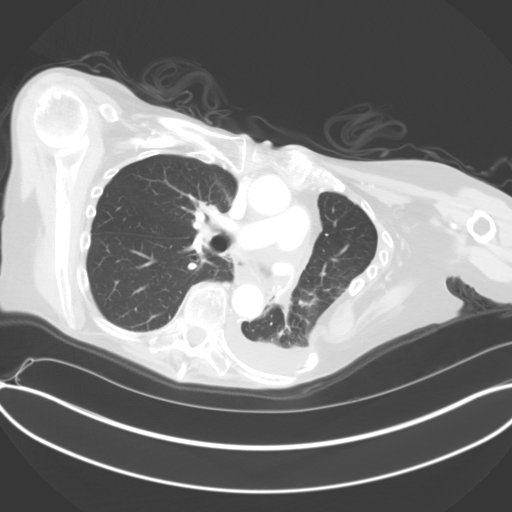
[im 114/126  mediastinal]
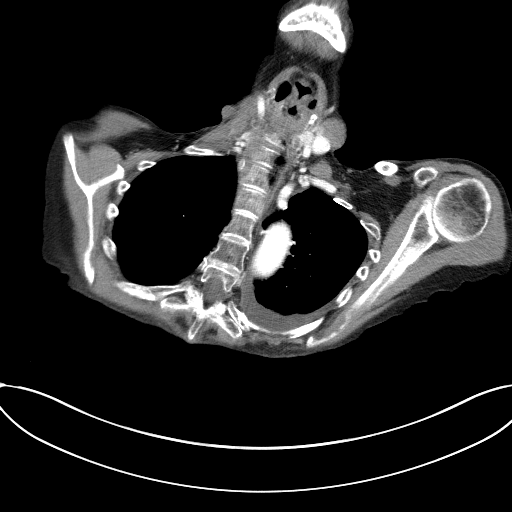
[im 114/126  lung]
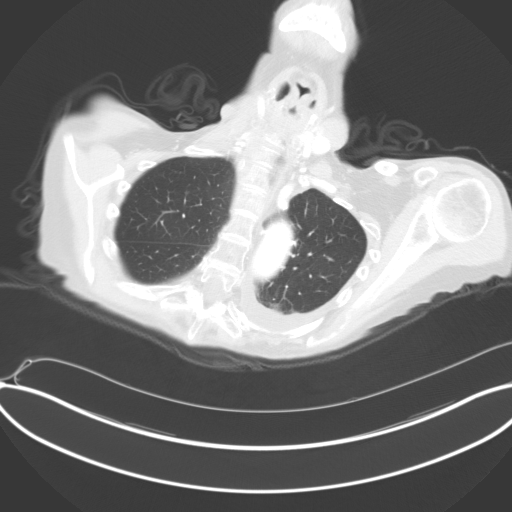

[Series 4: coronals · coronal · 0.94mm/px · 3 of 123 slices shown]
[im 25/123  lung]
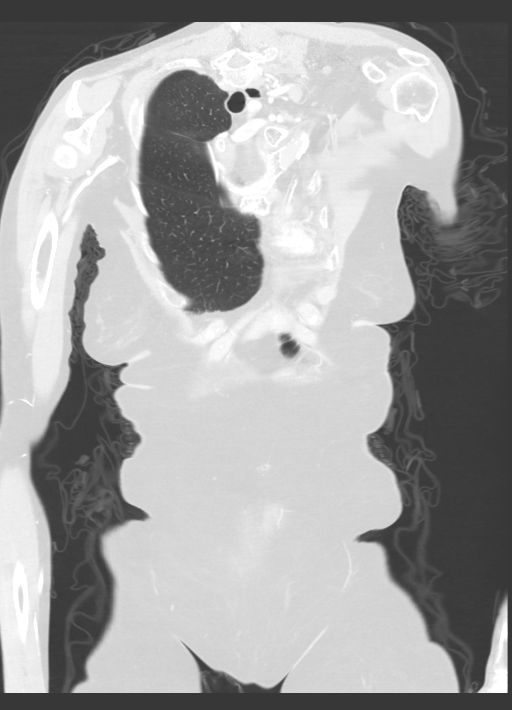
[im 49/123  lung]
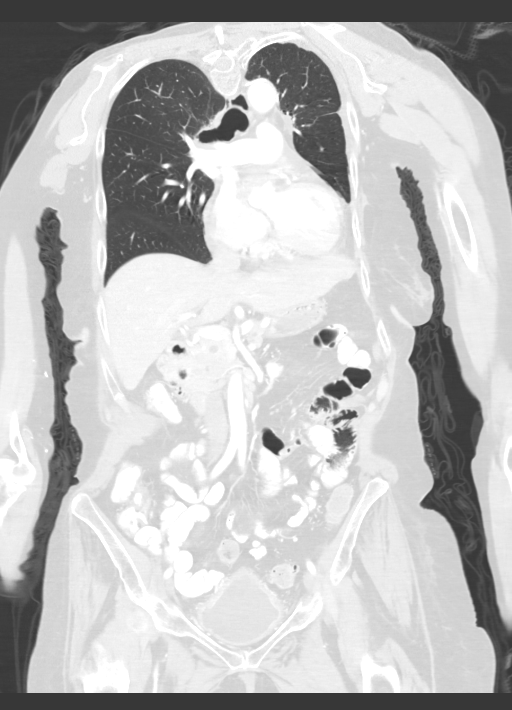
[im 74/123  lung]
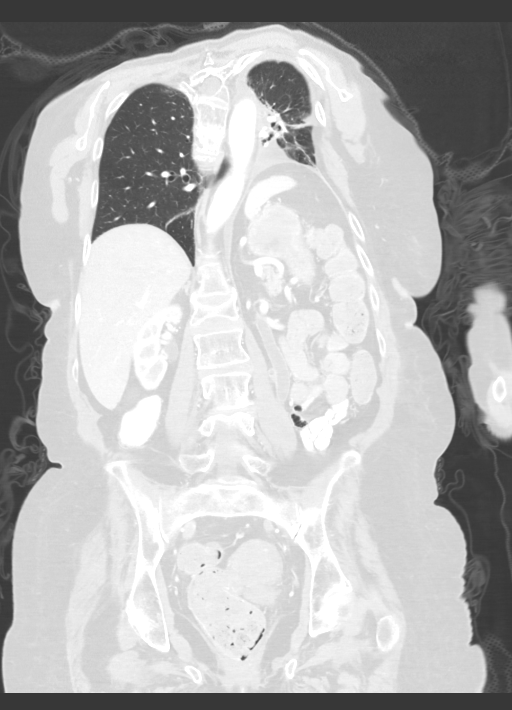

[12 of 36 positions shown; findings below may reference images not displayed]

FINDINGS: CT CHEST FINDINGS

Cardiovascular: The heart size is normal. No substantial pericardial
effusion. Atherosclerotic calcification is noted in the wall of the
thoracic aorta.

Mediastinum/Nodes: No mediastinal lymphadenopathy. There is no hilar
lymphadenopathy. Fluid in the proximal esophagus may be related to
dysmotility or reflux.

Lungs/Pleura: Volume loss left hemithorax compatible with prior left
lower lobectomy.Small left pleural effusion is similar to prior.
Soft tissue attenuation anterior right hilum 153/6 is stable. Stable
4 mm right upper lobe nodule on 52/6. Subsegmental atelectasis or
linear scarring in the right base is unchanged. Right lower lobe
granuloma. Architectural distortion and scarring in the left
parahilar lung is stable. 5 mm ground-glass opacity anterior left
lung on 48/6 is unchanged.

Musculoskeletal: Bones are diffusely demineralized. No worrisome
lytic or sclerotic osseous abnormality.

CT ABDOMEN PELVIS FINDINGS

Hepatobiliary: Tiny hypodensities inferior right liver are stable
consistent with benign etiology. Stable narrowing/stricture in the
mid gallbladder compared to prior study and also back to 11/23/2019.
Mild intra and extrahepatic biliary duct dilatation is stable to
mildly increased. Common bile duct is 11 mm diameter in the head of
pancreas.

Pancreas: Main duct dilatation again noted, most prominent in the
head of pancreas where it measures 5 mm today compared to 4-5 mm on
the prior study. No pancreatic head or ampullary lesion evident by
CT imaging.

Spleen: No splenomegaly. No focal mass lesion.

Adrenals/Urinary Tract: No adrenal nodule or mass. Right kidney
unremarkable. Duplicated left intrarenal collecting system noted
with new mild hydronephrosis involving both renal moieties. Filling
defects are seen in the upper pole collecting system and pelvis to
the lower pole moiety. There is also nonobstructing filling defect
in the proximal ureter of the upper pole moiety (see [DATE]). These
findings are new in the interval. Confluence of the duplicated left
ureter identified in the pelvis, just proximal to the crossing iliac
vessels. The confluent single distal ureter remains mildly dilated
into the UVJ. Irregular circumferential bladder wall thickening
evident.

Stomach/Bowel: Stomach is unremarkable. No gastric wall thickening.
No evidence of outlet obstruction. Duodenum is normally positioned
as is the ligament of Treitz. No small bowel wall thickening. No
small bowel dilatation. The terminal ileum is normal. The appendix
is normal. No gross colonic mass. No colonic wall thickening.

Vascular/Lymphatic: There is abdominal aortic atherosclerosis
without aneurysm. There is no gastrohepatic or hepatoduodenal
ligament lymphadenopathy. No retroperitoneal or mesenteric
lymphadenopathy. No pelvic sidewall lymphadenopathy.

Reproductive: Fibroids noted in the uterus. There is no adnexal
mass.

Other: No intraperitoneal free fluid.

Musculoskeletal: No worrisome lytic or sclerotic osseous
abnormality. Compression fractures noted at T10, L1, L4, and L5.
IMPRESSION: 1. Stable CT chest exam. Status post left lower lobectomy with
stable appearance of volume loss left hemithorax and small left
pleural effusion. No new or progressive findings to suggest
recurrent or metastatic disease in the chest, abdomen, or pelvis.
2. Stable appearance of small bilateral pulmonary nodules. Attention
on follow-up recommended.
3. Duplicated left intrarenal collecting system with hydronephrosis
of both moieties. Nonocclusive filling defects in the lower pole
collecting system and renal pelvis associated with nonobstructing
filling defects in the proximal ureter of the upper pole moiety.
These findings are new in the interval. Imaging features may be
related to infectious debris or hemorrhage. Underlying urothelial
neoplasm cannot be excluded.
4. Irregular circumferential bladder wall thickening. Duplicated
left ureter joins just proximal to the crossing iliac vessels and
remains dilated to the UVJ.
5. Stable to slight increase in intra and extrahepatic biliary duct
dilatation with persistent dilatation of the main pancreatic duct.
No pancreatic head or ampullary lesion evident by CT. Correlation
with liver function test may prove helpful.
6. Stable appearance of narrowing/stricture in the mid gallbladder
without obstruction.
7. Compression fractures at T10, L1, L4, and L5.
8. Aortic Atherosclerosis ([id number]).

## 2022-10-18 ENCOUNTER — Non-Acute Institutional Stay: Payer: Medicare PPO | Admitting: Hospice

## 2022-10-18 DIAGNOSIS — C799 Secondary malignant neoplasm of unspecified site: Secondary | ICD-10-CM

## 2022-10-18 DIAGNOSIS — K5901 Slow transit constipation: Secondary | ICD-10-CM

## 2022-10-18 DIAGNOSIS — R531 Weakness: Secondary | ICD-10-CM

## 2022-10-18 DIAGNOSIS — F0150 Vascular dementia without behavioral disturbance: Secondary | ICD-10-CM

## 2022-10-18 DIAGNOSIS — Z515 Encounter for palliative care: Secondary | ICD-10-CM

## 2022-10-18 NOTE — Progress Notes (Signed)
[facility] Palliative Care Consult Note Telephone: [phone]  Fax: [phone]  PATIENT NAME: Valerie [name] [place] [address] [phone] (home) [phone] (work) DOB: 02-Apr-1946 MRN: [id number]  PRIMARY CARE PROVIDER:    Pcp, No,  No address on file None  REFERRING PROVIDER:   Dr Reymundo Poll  RESPONSIBLE PARTY:   [name] - daughter Debe Coder - friend [place] - Fri  Contact Information     Name Relation Home Work [place] Daughter [phone]  [phone]   [facility] Daughter [phone]  [phone]   Rayna Sexton Sister [phone]  [phone]      I met face to face with patient at facility. Visit to build trust and highlight Palliative Medicine as specialized medical care for people living with serious illness, aimed at facilitating better quality of life through symptoms relief, assisting with advance care planning and complex medical decision making.   NP called [name] and updated her on visit.  She expressed appreciation and reported that patient is likely to move to his skilled nursing facility-[facility] in [place].   ASSESSMENT AND / RECOMMENDATIONS:   CODE STATUS: Patient is a Do Not Resuscitate.   Goals of Care: Goals include to maximize quality of life and symptom management. Family will be interested in hospice service in the future.  Visit consisted of counseling and education dealing with the complex and emotionally intense issues of symptom management and palliative care in the setting of serious and potentially life-threatening illness. Palliative care team will continue to support patient, patient's family, and medical team.  Symptom Management/Plan: Dementia: Memory loss/confusion is progressing, incontinent of bowel and bladder, Fast 6D. Continue Donepezil.  Fall and safety precautions. Encourage reminiscence, word search/puzzles, cueing  for recollection.  Continue ongoing supportive care . Metastatic lung CA: No more chemo/radiation.   Weakness: PT/OT is ongoing for mobility, transfers and strengthening;  self propels in wheelchair, max assist for transfers.  Facility PT Anderson Malta reports patient improving in strength and ability to self propel. Constipation: Managed with Miralax Follow up: Palliative care will continue to follow for complex medical decision making, advance care planning, and clarification of goals. Return 6 weeks or prn. Encouraged to call provider sooner with any concerns.   Family /Caregiver/Community Supports: Patient in assisted living for ongoing care.  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  Plan is to move patient to a skilled nursing facility likely by next month.  HOSPICE ELIGIBILITY/DIAGNOSIS: TBD  Chief Complaint: Follow-up visit  HISTORY OF PRESENT ILLNESS:  Valerie [name] is a 77 y.o. 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  Patient denied pain/discomfort.  History obtained from review of EMR, discussion with primary team, caregiver, family and/or Ms. Sorto.  Review and summarization of Epic records shows history from other than patient. Rest of 10 point ROS asked and negative.  I reviewed as needed, available labs, patient records, imaging, studies and related documents from the EMR  PAST MEDICAL HISTORY:  Active Ambulatory Problems    Diagnosis Date Noted   Primary cancer of right upper lobe of lung ([place]) 04/22/2009   Malignant neoplasm of brain ([place]) 04/22/2009   VITAMIN D DEFICIENCY 04/22/2009   ANAL FISTULA 04/22/2009   LESION, ANUS 04/26/2009   OSTEOPOROSIS 04/22/2009  DIARRHEA 04/26/2009   Lung metastasis    Closed left ankle fracture 10/01/2014    Right fibular fracture 10/01/2014   Ankle fracture 10/02/2014   Ankle fracture 10/02/2014   Lung cancer ([place]) 10/02/2014   Closed fibular fracture 10/02/2014   Dementia ([place]) 10/02/2014   Lower urinary tract infectious disease 10/02/2014   Reactive airway disease 10/02/2014   Fall 10/02/2014   Fracture of right proximal fibula    Urinary retention 10/20/2014   Ileus ([place]) 10/20/2014   Cellulitis of leg, right 10/20/2014   Encounter for antineoplastic chemotherapy 01/31/2015   Cough 08/24/2015   Chronic diarrhea 08/24/2015   Sepsis ([place])    Headache 07/24/2016   Blurry vision 01/06/2019   Closed fracture of distal end of right femur, initial encounter ([place]) 06/11/2019   Closed fracture of lateral portion of right tibial plateau 06/11/2019   History of DVT of lower extremity 06/11/2019   Acute blood loss anemia 06/12/2019   Pyelonephritis 09/10/2019   Congestive heart failure (CHF) ([place]) 02/14/2020   Acute combined systolic and diastolic heart failure (H[facility])    Elevated troponin    Dilated cardiomyopathy ([place])    Nonocclusive coronary atherosclerosis of native coronary artery    AMS (altered mental status) 03/23/2020   Hypokalemia 03/23/2020   Prolonged Q-T interval on ECG 03/23/2020   DNR (do not resuscitate) 03/23/2020   MRSA bacteremia 03/23/2020   Normocytic anemia 03/24/2020   Urinary tract infection without hematuria    SVT (supraventricular tachycardia) 03/26/2020   Pressure injury of skin 06/03/2020   Cellulitis 06/03/2020   Rash and nonspecific skin eruption    Metastatic malignant neoplasm ([place])    Failure to thrive in adult    Asymptomatic bacteriuria    Malnutrition of moderate degree 62/13/0865   Complicated UTI (urinary tract infection) 04/17/2021   Atherosclerosis of aorta ([place]) 05/11/2021   PAF (paroxysmal atrial fibrillation) ([place]) 07/07/2021   History of gross hematuria 07/07/2021   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   Dysarthria 78/46/9629    Acute metabolic encephalopathy 52/84/1324   UTI (urinary tract infection) 10/01/2021   COVID-19 virus infection 10/01/2021   Resolved Ambulatory Problems    Diagnosis Date Noted   Dementia ([place]) 04/22/2009   DVT, lower extremity ([place]) 10/20/2014   SIRS (systemic inflammatory response syndrome) ([place]) 08/24/2015   HCAP (healthcare-associated pneumonia) 08/24/2015   Influenza A 08/25/2015   Past Medical History:  Diagnosis Date   Abnormal Pap smear 2006   Anxiety    Arthritis    Atrophic vaginitis 2008   Cataract    Dyspareunia 2008   H/O osteoporosis    H/O varicella    H/O vitamin D deficiency    Heart murmur    History of measles, mumps, or rubella    History of radiation therapy 07/28/13- 08/10/13   Hypertension    Metastasis to brain [facility]) dx'd 2008   Nodule of right lung CT- 06/03/12   Nodule of right lung 06/03/12   On antineoplastic chemotherapy    Osteoporosis 2010   Shortness of breath    Status post chemotherapy 2003   Status post radiation therapy 2003   Status post radiation therapy 11/07/2005   Status post radiation therapy 06/02/2008   Thyroid adenoma    Thyroid cancer ([place]) 10/18/11 bx   Yeast infection     SOCIAL HX:  Social History   Tobacco Use   Smoking status: Never   Smokeless tobacco: Never   Tobacco comments:    smoked few  years in college  Substance Use Topics   Alcohol use: No     FAMILY HX:  Family History  Problem Relation Age of Onset   Gait disorder Mother    Cancer Mother        Meningioma   Cancer Father        Pancreatic   Heart failure Sister    Cancer Maternal Aunt        menigeoma      ALLERGIES:  Allergies  Allergen Reactions   Broccoli [Brassica Oleracea] Other (See Comments)    unknown   Fruit Extracts Other (See Comments)    unknown   Sulfa Antibiotics Other (See Comments)    She cant remember  what happens      PERTINENT MEDICATIONS:  Outpatient Encounter Medications as of 10/18/2022  Medication Sig    ACETAMINOPHEN EXTRA STRENGTH 500 MG tablet Take 1,000 mg by mouth every 8 (eight) hours as needed for moderate pain.   Ascorbic Acid (VITAMIN C) 100 MG tablet Take 100 mg by mouth every evening. Gummie   bisacodyl (DULCOLAX) 10 MG suppository Place 1 suppository (10 mg total) rectally as needed for moderate constipation.   cephALEXin (KEFLEX) 500 MG capsule Take 1 capsule (500 mg total) by mouth 2 (two) times daily.   donepezil (ARICEPT) 10 MG tablet TAKE 1 TABLET BY MOUTH ONCE DAILY IN THE EVENING (Patient taking differently: Take 10 mg by mouth every evening.)   feeding supplement (ENSURE ENLIVE / ENSURE PLUS) LIQD Take 237 mLs by mouth 2 (two) times daily between meals.   Hydrocortisone, Perianal, 1 % CREA Apply 1 g topically 2 (two) times daily.   hyoscyamine (LEVSIN) 0.125 MG tablet SMARTSIG:1 Tablet(s) By Mouth Every 12 Hours (Patient not taking: Reported on 01/03/2022)   loperamide (IMODIUM) 2 MG capsule Take 2 capsules (4 mg total) by mouth every 8 (eight) hours as needed (diarrhea).   Magnesium 200 MG TABS Take 200 mg by mouth daily.   Multiple Vitamin (MULTI-VITAMINS) TABS Take 1 tablet by mouth every evening.    NON FORMULARY Apply 1 application topically every 4 (four) hours as needed (for rash to buttocks).   ondansetron (ZOFRAN) 4 MG tablet Take 1 tablet (4 mg total) by mouth every 8 (eight) hours as needed for nausea or vomiting.   polyethylene glycol (MIRALAX) 17 g packet Take 17 g by mouth 2 (two) times daily.   PREPARATION H 0.25-14-74.9 % rectal ointment Place 1 application. rectally 2 (two) times daily as needed for hemorrhoids.   prochlorperazine (COMPAZINE) 10 MG tablet Take 1 tablet (10 mg total) by mouth every 6 (six) hours as needed for nausea or vomiting.   saccharomyces boulardii (FLORASTOR) 250 MG capsule Take 1 capsule (250 mg total) by mouth 2 (two) times daily.   vitamin B-12 (CYANOCOBALAMIN) 1000 MCG tablet Take 1,000 mcg by mouth every evening.   vitamin C  (ASCORBIC ACID) 250 MG tablet Take 250 mg by mouth daily.   Zinc Oxide (DESITIN) 13 % CREA Apply 1 application topically 2 (two) times daily. (Patient taking differently: Apply 1 application. topically 2 (two) times daily. To groin area)   No facility-administered encounter medications on file as of 10/18/2022.   I spent 60 minutes providing this consultation; this includes time spent with patient/family, chart review and documentation. More than 50% of the time in this consultation was spent on counseling and coordinating communication.   Thank you for the opportunity to participate in the care of Ms. Harned.  The palliative care team will continue to follow. Please call our office at [phone] if we can be of additional assistance.   Note: Portions of this note were generated with Lobbyist. Dictation errors may occur despite best attempts at proofreading.  Teodoro Spray, NP

## 2022-12-11 ENCOUNTER — Non-Acute Institutional Stay: Payer: Medicare PPO | Admitting: Hospice

## 2022-12-11 DIAGNOSIS — F0150 Vascular dementia without behavioral disturbance: Secondary | ICD-10-CM

## 2022-12-11 DIAGNOSIS — Z515 Encounter for palliative care: Secondary | ICD-10-CM

## 2022-12-11 DIAGNOSIS — C799 Secondary malignant neoplasm of unspecified site: Secondary | ICD-10-CM

## 2022-12-11 DIAGNOSIS — R531 Weakness: Secondary | ICD-10-CM

## 2022-12-11 NOTE — Progress Notes (Signed)
Therapist, nutritional Palliative Care Consult Note Telephone: [phone]  Fax: [phone]  PATIENT NAME: [name] [address] [place] [address] [phone] (home) [phone] (work) DOB: April 14, 1946 MRN: [id number]  PRIMARY CARE PROVIDER:    Dr.  Sharmon Revere, NP  REFERRING PROVIDER:   Dr.  Sharmon Revere, NP  RESPONSIBLE PARTY:   Sinclair Grooms - daughter Vernell Barrier - friend Mon - Fri  Contact Information     Name Relation Home Work Helen Daughter [phone]  [phone]   [facility] Daughter [phone]  [phone]   Derry Lory Sister [phone]  [phone]      I met face to face with patient at facility at the request of PCP. Visit to build trust and highlight Palliative Medicine as specialized medical care for people living with serious illness, aimed at facilitating better quality of life through symptoms relief, assisting with advance care planning and complex medical decision making.   [facility] recently [facility] to  skilled nursing facility-[facility] in [place]. Caregiver Taraine with patient during visit. Family calls and visits patient often. Strong family support system.   ASSESSMENT AND / RECOMMENDATIONS:   CODE STATUS: Patient is a Do Not Resuscitate.   Goals of Care: Goals include to maximize quality of life and symptom management. Family will be interested in hospice service in the future.  Visit consisted of counseling and education dealing with the complex and emotionally intense issues of symptom management and palliative care in the setting of serious and potentially life-threatening illness. Palliative care team will continue to support patient, patient's family, and medical team.  Symptom Management/Plan: Dementia: Memory loss/confusion is progressing, incontinent of bowel and bladder, Fast 6D.  Continue Donepezil.  Fall and safety precautions. Encourage reminiscence, word search/puzzles, cueing for recollection.  Continue ongoing supportive care.  Metastatic lung CA: No more chemo/radiation.    Weakness: PT/OT is ongoing for mobility, standing, transfers and strengthening; currently hoyer and stand up lifts for transfers,  self propels in wheelchair.   Constipation: Managed with Metamucil and Senna S.  Follow up: Palliative care will continue to follow for complex medical decision making, advance care planning, and clarification of goals. Return 6 weeks or prn. Encouraged to call provider sooner with any concerns.   Family /Caregiver/Community Supports: Patient in SNF for ongoing care.  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   HOSPICE ELIGIBILITY/DIAGNOSIS: TBD  Chief Complaint: Follow-up visit  HISTORY OF PRESENT ILLNESS:  [name] is a 77 y.o. 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  Patient denied pain/discomfort.  Caregiver and nursing with no concerns today.  History obtained from review of EMR, discussion with primary team, caregiver, family and/or Ms. Harvell.  Review and summarization of Epic records shows history from other than patient. Rest of 10 point ROS asked and negative.  I reviewed as needed, available labs, patient records, imaging, studies and related documents from the EMR  PAST MEDICAL HISTORY:  Active Ambulatory Problems    Diagnosis Date Noted   Primary cancer of right upper lobe of lung 04/22/2009   Malignant neoplasm of brain 04/22/2009   VITAMIN D DEFICIENCY 04/22/2009   ANAL FISTULA 04/22/2009   LESION, ANUS 04/26/2009   OSTEOPOROSIS 04/22/2009   DIARRHEA 04/26/2009  Lung metastasis    Closed left ankle fracture  10/01/2014   Right fibular fracture 10/01/2014   Ankle fracture 10/02/2014   Ankle fracture 10/02/2014   Lung cancer 10/02/2014   Closed fibular fracture 10/02/2014   Dementia 10/02/2014   Lower urinary tract infectious disease 10/02/2014   Reactive airway disease 10/02/2014   Fall 10/02/2014   Fracture of right proximal fibula    Urinary retention 10/20/2014   Ileus 10/20/2014   Cellulitis of leg, right 10/20/2014   Encounter for antineoplastic chemotherapy 01/31/2015   Cough 08/24/2015   Chronic diarrhea 08/24/2015   Sepsis    Headache 07/24/2016   Blurry vision 01/06/2019   Closed fracture of distal end of right femur, initial encounter 06/11/2019   Closed fracture of lateral portion of right tibial plateau 06/11/2019   History of DVT of lower extremity 06/11/2019   Acute blood loss anemia 06/12/2019   Pyelonephritis 09/10/2019   Congestive heart failure (CHF) 02/14/2020   Acute combined systolic and diastolic heart failure    Elevated troponin    Dilated cardiomyopathy    Nonocclusive coronary atherosclerosis of native coronary artery    AMS (altered mental status) 03/23/2020   Hypokalemia 03/23/2020   Prolonged Q-T interval on ECG 03/23/2020   DNR (do not resuscitate) 03/23/2020   MRSA bacteremia 03/23/2020   Normocytic anemia 03/24/2020   Urinary tract infection without hematuria    SVT (supraventricular tachycardia) 03/26/2020   Pressure injury of skin 06/03/2020   Cellulitis 06/03/2020   Rash and nonspecific skin eruption    Metastatic malignant neoplasm    Failure to thrive in adult    Asymptomatic bacteriuria    Malnutrition of moderate degree 06/13/2020   Complicated UTI (urinary tract infection) 04/17/2021   Atherosclerosis of aorta 05/11/2021   PAF (paroxysmal atrial fibrillation) 07/07/2021   History of gross hematuria 07/07/2021   Metastasis to lymph nodes 09/12/2021   Dysarthria 10/01/2021   Acute metabolic encephalopathy 10/01/2021   UTI (urinary  tract infection) 10/01/2021   COVID-19 virus infection 10/01/2021   Resolved Ambulatory Problems    Diagnosis Date Noted   Dementia 04/22/2009   DVT, lower extremity (HCC) 10/20/2014   SIRS (systemic inflammatory response syndrome) 08/24/2015   HCAP (healthcare-associated pneumonia) 08/24/2015   Influenza A 08/25/2015   Past Medical History:  Diagnosis Date   Abnormal Pap smear 2006   Anxiety    Arthritis    Atrophic vaginitis 2008   Cataract    Dyspareunia 2008   H/O osteoporosis    H/O varicella    H/O vitamin D deficiency    Heart murmur    History of measles, mumps, or rubella    History of radiation therapy 07/28/13- 08/10/13   Hypertension    Metastasis to brain (HCC) dx'd 2008   Nodule of right lung CT- 06/03/12   Nodule of right lung 06/03/12   On antineoplastic chemotherapy    Shortness of breath    Status post chemotherapy 2003   Status post radiation therapy 2003   Status post radiation therapy 11/07/2005   Status post radiation therapy 06/02/2008   Thyroid adenoma    Thyroid cancer (HCC) 10/18/11 bx   Yeast infection     SOCIAL HX:  Social History   Tobacco Use   Smoking status: Never   Smokeless tobacco: Never   Tobacco comments:    smoked few years in college  Substance Use Topics   Alcohol use: No     FAMILY HX:  Family History  Problem Relation Age of Onset   Gait disorder Mother    Cancer Mother        Meningioma   Cancer Father        Pancreatic   Heart failure Sister    Cancer Maternal Aunt        menigeoma      ALLERGIES:  Allergies  Allergen Reactions   Broccoli [Brassica Oleracea] Other (See Comments)    unknown   Fruit Extracts Other (See Comments)    unknown   Sulfa Antibiotics Other (See Comments)    She cant remember  what happens      PERTINENT MEDICATIONS:  Outpatient Encounter Medications as of 12/11/2022  Medication Sig   ACETAMINOPHEN EXTRA STRENGTH 500 MG tablet Take 1,000 mg by mouth every 8 (eight) hours as  needed for moderate pain.   Ascorbic Acid (VITAMIN C) 100 MG tablet Take 100 mg by mouth every evening. Gummie   bisacodyl (DULCOLAX) 10 MG suppository Place 1 suppository (10 mg total) rectally as needed for moderate constipation.   cephALEXin (KEFLEX) 500 MG capsule Take 1 capsule (500 mg total) by mouth 2 (two) times daily.   donepezil (ARICEPT) 10 MG tablet TAKE 1 TABLET BY MOUTH ONCE DAILY IN THE EVENING (Patient taking differently: Take 10 mg by mouth every evening.)   feeding supplement (ENSURE ENLIVE / ENSURE PLUS) LIQD Take 237 mLs by mouth 2 (two) times daily between meals.   Hydrocortisone, Perianal, 1 % CREA Apply 1 g topically 2 (two) times daily.   hyoscyamine (LEVSIN) 0.125 MG tablet SMARTSIG:1 Tablet(s) By Mouth Every 12 Hours (Patient not taking: Reported on 01/03/2022)   loperamide (IMODIUM) 2 MG capsule Take 2 capsules (4 mg total) by mouth every 8 (eight) hours as needed (diarrhea).   Magnesium 200 MG TABS Take 200 mg by mouth daily.   Multiple Vitamin (MULTI-VITAMINS) TABS Take 1 tablet by mouth every evening.    NON FORMULARY Apply 1 application topically every 4 (four) hours as needed (for rash to buttocks).   ondansetron (ZOFRAN) 4 MG tablet Take 1 tablet (4 mg total) by mouth every 8 (eight) hours as needed for nausea or vomiting.   polyethylene glycol (MIRALAX) 17 g packet Take 17 g by mouth 2 (two) times daily.   PREPARATION H 0.25-14-74.9 % rectal ointment Place 1 application. rectally 2 (two) times daily as needed for hemorrhoids.   prochlorperazine (COMPAZINE) 10 MG tablet Take 1 tablet (10 mg total) by mouth every 6 (six) hours as needed for nausea or vomiting.   saccharomyces boulardii (FLORASTOR) 250 MG capsule Take 1 capsule (250 mg total) by mouth 2 (two) times daily.   vitamin B-12 (CYANOCOBALAMIN) 1000 MCG tablet Take 1,000 mcg by mouth every evening.   vitamin C (ASCORBIC ACID) 250 MG tablet Take 250 mg by mouth daily.   Zinc Oxide (DESITIN) 13 % CREA Apply 1  application topically 2 (two) times daily. (Patient taking differently: Apply 1 application. topically 2 (two) times daily. To groin area)   No facility-administered encounter medications on file as of 12/11/2022.   I spent 40 minutes providing this consultation; this includes time spent with patient/family, chart review and documentation. More than 50% of the time in this consultation was spent on counseling and coordinating communication.   Thank you for the opportunity to participate in the care of Ms. Chumley.  The palliative care team will continue to follow. Please call our office at [phone] if we can be of additional assistance.  Note: Portions of this note were generated with Lobbyist. Dictation errors may occur despite best attempts at proofreading.  Teodoro Spray, NP

## 2022-12-28 ENCOUNTER — Non-Acute Institutional Stay: Payer: Medicare PPO | Admitting: Hospice

## 2022-12-28 DIAGNOSIS — K5901 Slow transit constipation: Secondary | ICD-10-CM

## 2022-12-28 DIAGNOSIS — F0150 Vascular dementia without behavioral disturbance: Secondary | ICD-10-CM

## 2022-12-28 DIAGNOSIS — R531 Weakness: Secondary | ICD-10-CM

## 2022-12-28 DIAGNOSIS — Z515 Encounter for palliative care: Secondary | ICD-10-CM

## 2022-12-28 DIAGNOSIS — C799 Secondary malignant neoplasm of unspecified site: Secondary | ICD-10-CM

## 2022-12-28 NOTE — Progress Notes (Signed)
Therapist, nutritional Palliative Care Consult Note Telephone: [phone]  Fax: [phone]  PATIENT NAME: [name] [address] [place] [address] [phone] (home) [phone] (work) DOB: Sep 22, 1945 MRN: [id number]  PRIMARY CARE PROVIDER:    Dr.  Sharmon Revere, NP  REFERRING PROVIDER:   Dr.  Sharmon Revere, NP  RESPONSIBLE PARTY:   Sinclair Grooms - daughter Vernell Barrier - friend Mon - Fri  Contact Information     Name Relation Home Work [place] Daughter [phone]  [phone]   [facility] Daughter [phone]  [phone]   Derry Lory Sister [phone]  [phone]      I met face to face with patient at facility at the request of PCP. Visit to build trust and highlight Palliative Medicine as specialized medical care for people living with serious illness, aimed at facilitating better quality of life through symptoms relief, assisting with advance care planning and complex medical decision making.    Patient is settling well  and adjusting well to her new SNF.  [facility] to  sk[facility] nursing facility[name] in [place].  Visit consisted of counseling and education dealing with the complex and emotionally intense issues of symptom management and palliative care in the setting of serious and potentially life-threatening illness. Palliative care team will continue to support patient, patient's family, and medical team.  ASSESSMENT AND / RECOMMENDATIONS:   CODE STATUS: Patient is a Do Not Resuscitate.   Goals of Care: Goals include to maximize quality of life and symptom management. Family will be interested in hospice service in the future.  Visit consisted of counseling and education dealing with the complex and emotionally intense issues of symptom management and palliative care in the setting of serious and  potentially life-threatening illness. Palliative care team will continue to support patient, patient's family, and medical team.  Symptom Management/Plan: Dementia: Memory loss/confusion is at baseline, incontinent of bowel and bladder, Fast 6D. Continue Donepezil.  Fall and safety precautions. Encourage reminiscence, word search/puzzles, cueing for recollection.  Continue ongoing supportive care.  Metastatic lung CA: No more chemo/radiation.    Weakness: Continue PT/OT for mobility, standing, transfers and strengthening; currently hoyer and stand up lifts for transfers,  self propels in wheelchair.    Constipation: Stable/managed with Metamucil and Senna S.  Follow up: Palliative care will continue to follow for complex medical decision making, advance care planning, and clarification of goals. Return 6 weeks or prn. Encouraged to call provider sooner with any concerns.   Family /Caregiver/Community Supports: Patient in SNF for ongoing care.  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   HOSPICE ELIGIBILITY/DIAGNOSIS: TBD  Chief Complaint: Follow-up visit  HISTORY OF PRESENT ILLNESS:  [name] is a 77 y.o. 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  Patient denied pain/discomfort.  Caregiver and nursing with no concerns today.  No hospitalizations since last visit history obtained from review of EMR, discussion with primary team, caregiver, family and/or Ms. Mcpartlin.  Review and summarization of Epic records shows history from other than patient. Rest of 10 point ROS asked and negative.  I reviewed as needed, available labs, patient records, imaging, studies and related documents from the EMR  PAST MEDICAL HISTORY:  Active Ambulatory Problems  Diagnosis Date  Noted   Primary cancer of right upper lobe of lung (HCC) 04/22/2009   Malignant neoplasm of brain (HCC) 04/22/2009   VITAMIN D DEFICIENCY 04/22/2009   ANAL FISTULA 04/22/2009   LESION, ANUS 04/26/2009   OSTEOPOROSIS 04/22/2009   DIARRHEA 04/26/2009   Lung metastasis    Closed left ankle fracture 10/01/2014   Right fibular fracture 10/01/2014   Ankle fracture 10/02/2014   Ankle fracture 10/02/2014   Lung cancer (HCC) 10/02/2014   Closed fibular fracture 10/02/2014   Dementia (HCC) 10/02/2014   Lower urinary tract infectious disease 10/02/2014   Reactive airway disease 10/02/2014   Fall 10/02/2014   Fracture of right proximal fibula    Urinary retention 10/20/2014   Ileus (HCC) 10/20/2014   Cellulitis of leg, right 10/20/2014   Encounter for antineoplastic chemotherapy 01/31/2015   Cough 08/24/2015   Chronic diarrhea 08/24/2015   Sepsis (HCC)    Headache 07/24/2016   Blurry vision 01/06/2019   Closed fracture of distal end of right femur, initial encounter (HCC) 06/11/2019   Closed fracture of lateral portion of right tibial plateau 06/11/2019   History of DVT of lower extremity 06/11/2019   Acute blood loss anemia 06/12/2019   Pyelonephritis 09/10/2019   Congestive heart failure (CHF) (HCC) 02/14/2020   Acute combined systolic and diastolic heart failure (HCC)    Elevated troponin    Dilated cardiomyopathy (HCC)    Nonocclusive coronary atherosclerosis of native coronary artery    AMS (altered mental status) 03/23/2020   Hypokalemia 03/23/2020   Prolonged Q-T interval on ECG 03/23/2020   DNR (do not resuscitate) 03/23/2020   MRSA bacteremia 03/23/2020   Normocytic anemia 03/24/2020   Urinary tract infection without hematuria    SVT (supraventricular tachycardia) 03/26/2020   Pressure injury of skin 06/03/2020   Cellulitis 06/03/2020   Rash and nonspecific skin eruption    Metastatic malignant neoplasm (HCC)    Failure to thrive in adult    Asymptomatic bacteriuria     Malnutrition of moderate degree 06/13/2020   Complicated UTI (urinary tract infection) 04/17/2021   Atherosclerosis of aorta (HCC) 05/11/2021   PAF (paroxysmal atrial fibrillation) (HCC) 07/07/2021   History of gross hematuria 07/07/2021   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   Dysarthria 10/01/2021   Acute metabolic encephalopathy 10/01/2021   UTI (urinary tract infection) 10/01/2021   COVID-19 virus infection 10/01/2021   Resolved Ambulatory Problems    Diagnosis Date Noted   Dementia (HCC) 04/22/2009   DVT, lower extremity (HCC) 10/20/2014   SIRS (systemic inflammatory response syndrome) (HCC) 08/24/2015   HCAP (healthcare-associated pneumonia) 08/24/2015   Influenza A 08/25/2015   Past Medical History:  Diagnosis Date   Abnormal Pap smear 2006   Anxiety    Arthritis    Atrophic vaginitis 2008   Cataract    Dyspareunia 2008   H/O osteoporosis    H/O varicella    H/O vitamin D deficiency    Heart murmur    History of measles, mumps, or rubella    History of radiation therapy 07/28/13- 08/10/13   Hypertension    Metastasis to brain [facility]) dx'd 2008   Nodule of right lung CT- 06/03/12   Nodule of right lung 06/03/12   On antineoplastic chemotherapy    Shortness of breath    Status post chemotherapy 2003   Status post radiation therapy 2003   Status post radiation therapy 11/07/2005   Status post radiation therapy 06/02/2008   Thyroid adenoma  Thyroid cancer (HCC) 10/18/11 bx   Yeast infection     SOCIAL HX:  Social History   Tobacco Use   Smoking status: Never   Smokeless tobacco: Never   Tobacco comments:    smoked few years in college  Substance Use Topics   Alcohol use: No     FAMILY HX:  Family History  Problem Relation Age of Onset   Gait disorder Mother    Cancer Mother        Meningioma   Cancer Father        Pancreatic   Heart failure Sister    Cancer Maternal Aunt        menigeoma      ALLERGIES:  Allergies  Allergen Reactions    Broccoli [Brassica Oleracea] Other (See Comments)    unknown   Fruit Extracts Other (See Comments)    unknown   Sulfa Antibiotics Other (See Comments)    She cant remember  what happens      PERTINENT MEDICATIONS:  Outpatient Encounter Medications as of 12/28/2022  Medication Sig   ACETAMINOPHEN EXTRA STRENGTH 500 MG tablet Take 1,000 mg by mouth every 8 (eight) hours as needed for moderate pain.   Ascorbic Acid (VITAMIN C) 100 MG tablet Take 100 mg by mouth every evening. Gummie   bisacodyl (DULCOLAX) 10 MG suppository [name] 1 suppository (10 mg total) rectally as needed for moderate constipation.   cephALEXin (KEFLEX) 500 MG capsule Take 1 capsule (500 mg total) by mouth 2 (two) times daily.   donepezil (ARICEPT) 10 MG tablet TAKE 1 TABLET BY MOUTH ONCE DAILY IN THE EVENING (Patient taking differently: Take 10 mg by mouth every evening.)   feeding supplement (ENSURE ENLIVE / ENSURE PLUS) LIQD Take 237 mLs by mouth 2 (two) times daily between meals.   Hydrocortisone, Perianal, 1 % CREA Apply 1 g topically 2 (two) times daily.   hyoscyamine (LEVSIN) 0.125 MG tablet SMARTSIG:1 Tablet(s) By Mouth Every 12 Hours (Patient not taking: Reported on 01/03/2022)   loperamide (IMODIUM) 2 MG capsule Take 2 capsules (4 mg total) by mouth every 8 (eight) hours as needed (diarrhea).   Magnesium 200 MG TABS Take 200 mg by mouth daily.   Multiple Vitamin (MULTI-VITAMINS) TABS Take 1 tablet by mouth every evening.    NON FORMULARY Apply 1 application topically every 4 (four) hours as needed (for rash to buttocks).   ondansetron (ZOFRAN) 4 MG tablet Take 1 tablet (4 mg total) by mouth every 8 (eight) hours as needed for nausea or vomiting.   polyethylene glycol (MIRALAX) 17 g packet Take 17 g by mouth 2 (two) times daily.   PREPARATION H 0.25-14-74.9 % rectal ointment [name] 1 application. rectally 2 (two) times daily as needed for hemorrhoids.   prochlorperazine (COMPAZINE) 10 MG tablet Take 1 tablet (10 mg  total) by mouth every 6 (six) hours as needed for nausea or vomiting.   saccharomyces boulardii (FLORASTOR) 250 MG capsule Take 1 capsule (250 mg total) by mouth 2 (two) times daily.   vitamin B-12 (CYANOCOBALAMIN) 1000 MCG tablet Take 1,000 mcg by mouth every evening.   vitamin C (ASCORBIC ACID) 250 MG tablet Take 250 mg by mouth daily.   Zinc Oxide (DESITIN) 13 % CREA Apply 1 application topically 2 (two) times daily. (Patient taking differently: Apply 1 application. topically 2 (two) times daily. To groin area)   No facility-administered encounter medications on file as of 12/28/2022.   I spent 40 minutes providing this consultation; this  includes time spent with patient/family, chart review and documentation. More than 50% of the time in this consultation was spent on counseling and coordinating communication.   Thank you for the opportunity to participate in the care of Ms. Smyser.  The palliative care team will continue to follow. Please call our office at [phone] if we can be of additional assistance.   Note: Portions of this note were generated with Scientist, clinical (histocompatibility and immunogenetics). Dictation errors may occur despite best attempts at proofreading.  Rosaura Carpenter, NP

## 2023-01-22 ENCOUNTER — Non-Acute Institutional Stay: Payer: Medicare PPO | Admitting: Hospice

## 2023-01-22 DIAGNOSIS — R531 Weakness: Secondary | ICD-10-CM

## 2023-01-22 DIAGNOSIS — C799 Secondary malignant neoplasm of unspecified site: Secondary | ICD-10-CM

## 2023-01-22 DIAGNOSIS — Z515 Encounter for palliative care: Secondary | ICD-10-CM

## 2023-01-22 DIAGNOSIS — K5901 Slow transit constipation: Secondary | ICD-10-CM

## 2023-01-22 DIAGNOSIS — F0150 Vascular dementia without behavioral disturbance: Secondary | ICD-10-CM

## 2023-01-22 NOTE — Progress Notes (Signed)
Therapist, nutritional Palliative Care Consult Note Telephone: [phone]  Fax: [phone]  PATIENT NAME: [name] [address] [place] [address] [phone] (home) [phone] (work) DOB: 02/20/1946 MRN: [id number]  PRIMARY CARE PROVIDER:    Dr.  Sharmon Revere, NP  REFERRING PROVIDER:   Dr.  Sharmon Revere, NP  RESPONSIBLE PARTY:   Sinclair Grooms - daughter Vernell Barrier - friend Mon - Fri  Contact Information     Name Relation Home Work [place] Daughter [phone]  [phone]   [facility] [place] Daughter [phone]  [phone]   Derry Lory Sister [phone]  [phone]      I met face to face with patient at facility at the request of PCP. Visit to build trust and highlight Palliative Medicine as specialized medical care for people living with serious illness, aimed at facilitating better quality of life through symptoms relief, assisting with advance care planning and complex medical decision making.     ASSESSMENT AND / RECOMMENDATIONS:   CODE STATUS: Patient is a Do Not Resuscitate.   Goals of Care: Goals include to maximize quality of life and symptom management. Family will be interested in hospice service in the future.  Visit consisted of counseling and education dealing with the complex and emotionally intense issues of symptom management and palliative care in the setting of serious and potentially life-threatening illness. Palliative care team will continue to support patient, patient's family, and medical team.  Symptom Management/Plan: Dementia: Continue Donepezil as ordered. Memory loss/confusion is at baseline, incontinent of bowel and bladder, FAST 6D.  Fall and safety precautions. Encourage reminiscence, word search/puzzles, cueing for recollection.  Continue ongoing supportive care.  Metastatic lung CA: No more chemo/radiation.     Weakness: improving; Continue PT/OT for mobility, standing, transfers and strengthening; currently hoyer and stand up lifts for transfers,  self propels in wheelchair.    Constipation: Stable/managed with Metamucil and Senna S.  Follow up: Palliative care will continue to follow for complex medical decision making, advance care planning, and clarification of goals. Return 6 weeks or prn. Encouraged to call provider sooner with any concerns.   Family /Caregiver/Community Supports: Patient in SNF for ongoing care.  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   HOSPICE ELIGIBILITY/DIAGNOSIS: TBD  Chief Complaint: Follow-up visit  HISTORY OF PRESENT ILLNESS:  Valerie [name] is a 77 y.o. 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  Patient denied pain/discomfort.  She reports looking forward to her PT/OT sessions; caregiver and nursing with no concerns today.  No hospitalizations since last visit history obtained from review of EMR, discussion with primary team, caregiver, family and/or Ms. Vanepps.  Review and summarization of Epic records shows history from other than patient. Rest of 10 point ROS asked and negative.  I reviewed as needed, available labs, patient records, imaging, studies and related documents from the EMR  PAST MEDICAL HISTORY:  Active Ambulatory Problems    Diagnosis Date Noted   Primary cancer of right upper lobe of lung (HCC) 04/22/2009   Malignant neoplasm of brain (HCC) 04/22/2009   VITAMIN D DEFICIENCY 04/22/2009   ANAL FISTULA 04/22/2009   LESION, ANUS 04/26/2009   OSTEOPOROSIS 04/22/2009   DIARRHEA 04/26/2009   Lung metastasis    Closed left ankle fracture 10/01/2014  Right fibular fracture 10/01/2014   Ankle fracture 10/02/2014   Ankle  fracture 10/02/2014   Lung cancer (HCC) 10/02/2014   Closed fibular fracture 10/02/2014   Dementia (HCC) 10/02/2014   Lower urinary tract infectious disease 10/02/2014   Reactive airway disease 10/02/2014   Fall 10/02/2014   Fracture of right proximal fibula    Urinary retention 10/20/2014   Ileus (HCC) 10/20/2014   Cellulitis of leg, right 10/20/2014   Encounter for antineoplastic chemotherapy 01/31/2015   Cough 08/24/2015   Chronic diarrhea 08/24/2015   Sepsis (HCC)    Headache 07/24/2016   Blurry vision 01/06/2019   Closed fracture of distal end of right femur, initial encounter (HCC) 06/11/2019   Closed fracture of lateral portion of right tibial plateau 06/11/2019   History of DVT of lower extremity 06/11/2019   Acute blood loss anemia 06/12/2019   Pyelonephritis 09/10/2019   Congestive heart failure (CHF) (HCC) 02/14/2020   Acute combined systolic and diastolic heart failure (HCC)    Elevated troponin    Dilated cardiomyopathy (HCC)    Nonocclusive coronary atherosclerosis of native coronary artery    AMS (altered mental status) 03/23/2020   Hypokalemia 03/23/2020   Prolonged Q-T interval on ECG 03/23/2020   DNR (do not resuscitate) 03/23/2020   MRSA bacteremia 03/23/2020   Normocytic anemia 03/24/2020   Urinary tract infection without hematuria    SVT (supraventricular tachycardia) 03/26/2020   Pressure injury of skin 06/03/2020   Cellulitis 06/03/2020   Rash and nonspecific skin eruption    Metastatic malignant neoplasm (HCC)    Failure to thrive in adult    Asymptomatic bacteriuria    Malnutrition of moderate degree 06/13/2020   Complicated UTI (urinary tract infection) 04/17/2021   Atherosclerosis of aorta (HCC) 05/11/2021   PAF (paroxysmal atrial fibrillation) (HCC) 07/07/2021   History of gross hematuria 07/07/2021   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   Dysarthria 10/01/2021   Acute metabolic encephalopathy 10/01/2021   UTI (urinary tract  infection) 10/01/2021   COVID-19 virus infection 10/01/2021   Resolved Ambulatory Problems    Diagnosis Date Noted   Dementia (HCC) 04/22/2009   DVT, lower extremity (HCC) 10/20/2014   SIRS (systemic inflammatory response syndrome) (HCC) 08/24/2015   HCAP (healthcare-associated pneumonia) 08/24/2015   Influenza A 08/25/2015   Past Medical History:  Diagnosis Date   Abnormal Pap smear 2006   Anxiety    Arthritis    Atrophic vaginitis 2008   Cataract    Dyspareunia 2008   H/O osteoporosis    H/O varicella    H/O vitamin D deficiency    Heart murmur    History of measles, mumps, or rubella    History of radiation therapy 07/28/13- 08/10/13   Hypertension    Metastasis to brain [facility]) dx'd 2008   Nodule of right lung CT- 06/03/12   Nodule of right lung 06/03/12   On antineoplastic chemotherapy    Shortness of breath    Status post chemotherapy 2003   Status post radiation therapy 2003   Status post radiation therapy 11/07/2005   Status post radiation therapy 06/02/2008   Thyroid adenoma    Thyroid cancer (HCC) 10/18/11 bx   Yeast infection     SOCIAL HX:  Social History   Tobacco Use   Smoking status: Never   Smokeless tobacco: Never   Tobacco comments:    smoked few years in college  Substance Use Topics   Alcohol use: No     FAMILY HX:  Family  History  Problem Relation Age of Onset   Gait disorder Mother    Cancer Mother        Meningioma   Cancer Father        Pancreatic   Heart failure Sister    Cancer Maternal Aunt        menigeoma      ALLERGIES:  Allergies  Allergen Reactions   Broccoli [Brassica Oleracea] Other (See Comments)    unknown   Fruit Extracts Other (See Comments)    unknown   Sulfa Antibiotics Other (See Comments)    She cant remember  what happens      PERTINENT MEDICATIONS:  Outpatient Encounter Medications as of 01/22/2023  Medication Sig   ACETAMINOPHEN EXTRA STRENGTH 500 MG tablet Take 1,000 mg by mouth every 8 (eight) hours  as needed for moderate pain.   Ascorbic Acid (VITAMIN C) 100 MG tablet Take 100 mg by mouth every evening. Gummie   bisacodyl (DULCOLAX) 10 MG suppository Place 1 suppository (10 mg total) rectally as needed for moderate constipation.   cephALEXin (KEFLEX) 500 MG capsule Take 1 capsule (500 mg total) by mouth 2 (two) times daily.   donepezil (ARICEPT) 10 MG tablet TAKE 1 TABLET BY MOUTH ONCE DAILY IN THE EVENING (Patient taking differently: Take 10 mg by mouth every evening.)   feeding supplement (ENSURE ENLIVE / ENSURE PLUS) LIQD Take 237 mLs by mouth 2 (two) times daily between meals.   Hydrocortisone, Perianal, 1 % CREA Apply 1 g topically 2 (two) times daily.   hyoscyamine (LEVSIN) 0.125 MG tablet SMARTSIG:1 Tablet(s) By Mouth Every 12 Hours (Patient not taking: Reported on 01/03/2022)   loperamide (IMODIUM) 2 MG capsule Take 2 capsules (4 mg total) by mouth every 8 (eight) hours as needed (diarrhea).   Magnesium 200 MG TABS Take 200 mg by mouth daily.   Multiple Vitamin (MULTI-VITAMINS) TABS Take 1 tablet by mouth every evening.    NON FORMULARY Apply 1 application topically every 4 (four) hours as needed (for rash to buttocks).   ondansetron (ZOFRAN) 4 MG tablet Take 1 tablet (4 mg total) by mouth every 8 (eight) hours as needed for nausea or vomiting.   polyethylene glycol (MIRALAX) 17 g packet Take 17 g by mouth 2 (two) times daily.   PREPARATION H 0.25-14-74.9 % rectal ointment Place 1 application. rectally 2 (two) times daily as needed for hemorrhoids.   prochlorperazine (COMPAZINE) 10 MG tablet Take 1 tablet (10 mg total) by mouth every 6 (six) hours as needed for nausea or vomiting.   saccharomyces boulardii (FLORASTOR) 250 MG capsule Take 1 capsule (250 mg total) by mouth 2 (two) times daily.   vitamin B-12 (CYANOCOBALAMIN) 1000 MCG tablet Take 1,000 mcg by mouth every evening.   vitamin C (ASCORBIC ACID) 250 MG tablet Take 250 mg by mouth daily.   Zinc Oxide (DESITIN) 13 % CREA Apply  1 application topically 2 (two) times daily. (Patient taking differently: Apply 1 application. topically 2 (two) times daily. To groin area)   No facility-administered encounter medications on file as of 01/22/2023.   I spent 35 minutes providing this consultation; this includes time spent with patient/family, chart review and documentation. More than 50% of the time in this consultation was spent on counseling and coordinating communication.   Thank you for the opportunity to participate in the care of Ms. Mattie.  The palliative care team will continue to follow. Please call our office at [phone] if we can be of additional  assistance.   Note: Portions of this note were generated with Scientist, clinical (histocompatibility and immunogenetics). Dictation errors may occur despite best attempts at proofreading.  Rosaura Carpenter, NP

## 2023-02-08 ENCOUNTER — Telehealth: Payer: Self-pay | Admitting: Medical Oncology

## 2023-02-08 NOTE — Telephone Encounter (Addendum)
PT LVM "I would like to have a CT scans. It has been a while and there is something going on down there". Unable to get back in touch with pt.  LVM for dtr to return my call re her mothers VM.

## 2023-02-08 NOTE — Telephone Encounter (Addendum)
I spoke to dtr [facility]. She said her mom has not had  CTs in a long time and has a lot of urinary complaints. She she does not want to discount any of her mom's symptoms so she is asking if [facility] will order CT CAP.

## 2023-02-11 ENCOUNTER — Other Ambulatory Visit: Payer: Self-pay | Admitting: *Deleted

## 2023-02-11 ENCOUNTER — Telehealth: Payer: Self-pay | Admitting: *Deleted

## 2023-02-11 NOTE — Telephone Encounter (Signed)
Opened in error

## 2023-02-13 ENCOUNTER — Other Ambulatory Visit: Payer: Self-pay | Admitting: Medical Oncology

## 2023-02-13 DIAGNOSIS — C3411 Malignant neoplasm of upper lobe, right bronchus or lung: Secondary | ICD-10-CM

## 2023-02-13 DIAGNOSIS — C3490 Malignant neoplasm of unspecified part of unspecified bronchus or lung: Secondary | ICD-10-CM

## 2023-02-13 NOTE — Progress Notes (Signed)
CT scans ordered.

## 2023-02-14 ENCOUNTER — Telehealth: Payer: Self-pay | Admitting: Internal Medicine

## 2023-02-14 NOTE — Telephone Encounter (Signed)
Patient daughter is aware of upcoming appointment,

## 2023-02-18 ENCOUNTER — Telehealth: Payer: Self-pay | Admitting: Internal Medicine

## 2023-02-22 ENCOUNTER — Other Ambulatory Visit: Payer: Medicare PPO

## 2023-02-28 ENCOUNTER — Other Ambulatory Visit: Payer: Self-pay | Admitting: Medical Oncology

## 2023-02-28 DIAGNOSIS — C3411 Malignant neoplasm of upper lobe, right bronchus or lung: Secondary | ICD-10-CM

## 2023-03-01 ENCOUNTER — Inpatient Hospital Stay: Payer: Medicare PPO | Attending: Internal Medicine

## 2023-03-01 ENCOUNTER — Ambulatory Visit (HOSPITAL_COMMUNITY)
Admission: RE | Admit: 2023-03-01 | Discharge: 2023-03-01 | Disposition: A | Discharge status: Home / Self Care | Payer: Medicare PPO | Source: Ambulatory Visit | Attending: Internal Medicine | Admitting: Internal Medicine

## 2023-03-01 ENCOUNTER — Other Ambulatory Visit: Payer: Self-pay

## 2023-03-01 DIAGNOSIS — C3411 Malignant neoplasm of upper lobe, right bronchus or lung: Secondary | ICD-10-CM | POA: Insufficient documentation

## 2023-03-01 DIAGNOSIS — C3490 Malignant neoplasm of unspecified part of unspecified bronchus or lung: Secondary | ICD-10-CM | POA: Insufficient documentation

## 2023-03-01 DIAGNOSIS — C770 Secondary and unspecified malignant neoplasm of lymph nodes of head, face and neck: Secondary | ICD-10-CM | POA: Diagnosis present

## 2023-03-01 LAB — CMP (CANCER CENTER ONLY)
ALT: 7 U/L (ref 0–44)
AST: 12 U/L — ABNORMAL LOW (ref 15–41)
Albumin: 4 g/dL (ref 3.5–5.0)
Alkaline Phosphatase: 86 U/L (ref 38–126)
Anion gap: 6 (ref 5–15)
BUN: 17 mg/dL (ref 8–23)
CO2: 27 mmol/L (ref 22–32)
Calcium: 9.7 mg/dL (ref 8.9–10.3)
Chloride: 104 mmol/L (ref 98–111)
Creatinine: 1.08 mg/dL — ABNORMAL HIGH (ref 0.44–1.00)
GFR, Estimated: 53 mL/min — ABNORMAL LOW (ref >60)
Glucose, Bld: 94 mg/dL (ref 70–99)
Potassium: 3.9 mmol/L (ref 3.5–5.1)
Sodium: 137 mmol/L (ref 135–145)
Total Bilirubin: 0.6 mg/dL (ref 0.3–1.2)
Total Protein: 7.1 g/dL (ref 6.5–8.1)

## 2023-03-01 LAB — CBC WITH DIFFERENTIAL (CANCER CENTER ONLY)
Abs Immature Granulocytes: 0.02 10*3/uL (ref 0.00–0.07)
Basophils Absolute: 0 10*3/uL (ref 0.0–0.1)
Basophils Relative: 0 %
Eosinophils Absolute: 0.1 10*3/uL (ref 0.0–0.5)
Eosinophils Relative: 2 %
HCT: 40.3 % (ref 36.0–46.0)
Hemoglobin: 12.6 g/dL (ref 12.0–15.0)
Immature Granulocytes: 0 %
Lymphocytes Relative: 15 %
Lymphs Abs: 1.1 10*3/uL (ref 0.7–4.0)
MCH: 29.4 pg (ref 26.0–34.0)
MCHC: 31.3 g/dL (ref 30.0–36.0)
MCV: 94.2 fL (ref 80.0–100.0)
Monocytes Absolute: 0.5 10*3/uL (ref 0.1–1.0)
Monocytes Relative: 7 %
Neutro Abs: 5.6 10*3/uL (ref 1.7–7.7)
Neutrophils Relative %: 76 %
Platelet Count: 266 10*3/uL (ref 150–400)
RBC: 4.28 MIL/uL (ref 3.87–5.11)
RDW: 14.6 % (ref 11.5–15.5)
WBC Count: 7.4 10*3/uL (ref 4.0–10.5)
nRBC: 0 % (ref 0.0–0.2)

## 2023-03-01 MED ORDER — IOHEXOL 300 MG/ML  SOLN
100.0000 mL | Freq: Once | INTRAMUSCULAR | Status: AC | PRN
  Administered 2023-03-01: 100 mL via INTRAVENOUS

## 2023-03-01 MED ORDER — SODIUM CHLORIDE (PF) 0.9 % IJ SOLN
INTRAMUSCULAR | Status: AC
  Filled 2023-03-01: qty 50

## 2023-03-05 ENCOUNTER — Inpatient Hospital Stay (HOSPITAL_BASED_OUTPATIENT_CLINIC_OR_DEPARTMENT_OTHER): Payer: Medicare PPO | Admitting: Internal Medicine

## 2023-03-05 DIAGNOSIS — C3411 Malignant neoplasm of upper lobe, right bronchus or lung: Secondary | ICD-10-CM

## 2023-03-05 NOTE — Progress Notes (Signed)
No Show

## 2023-04-13 IMAGING — CT CT RENAL STONE PROTOCOL
2 of 4 series · 16 of 46 positions shown, 18 images · non-contrast
Comparison: CT dated 02/23/2021.

CLINICAL DATA: Flank pain.  Concern for kidney stone.

EXAM:
CT ABDOMEN AND PELVIS WITHOUT CONTRAST
TECHNIQUE: Multidetector CT imaging of the abdomen and pelvis was performed
following the standard protocol without IV contrast.

[Series 3: stone study 5.0 i30f 2 · axial · 0.91mm/px · z∈[+811,+1241]mm · 13 of 94 slices shown, 15 images]
[im 4/94  soft-tissue]
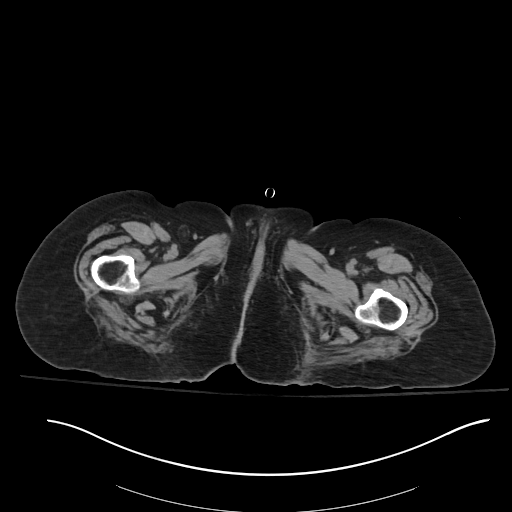
[im 4/94  bone]
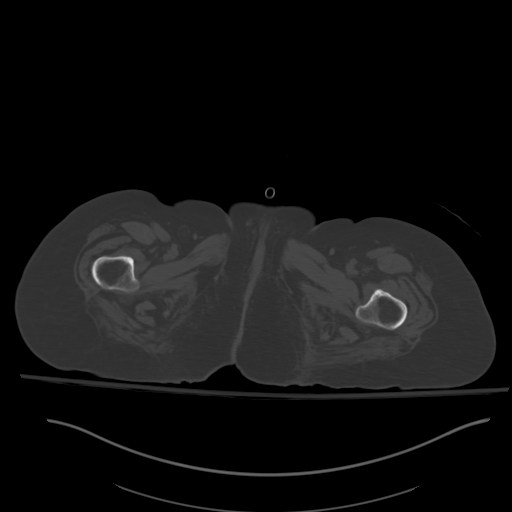
[im 11/94  soft-tissue]
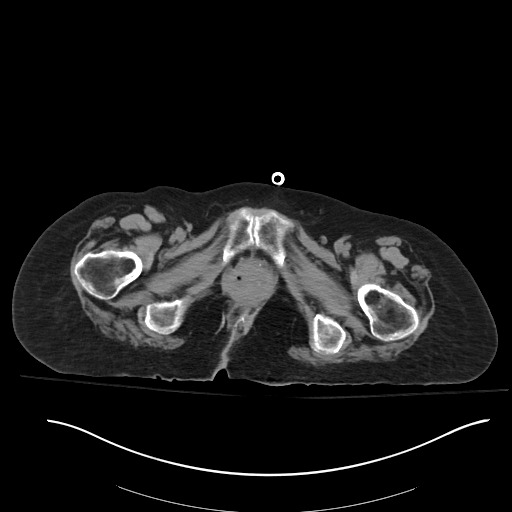
[im 18/94  soft-tissue]
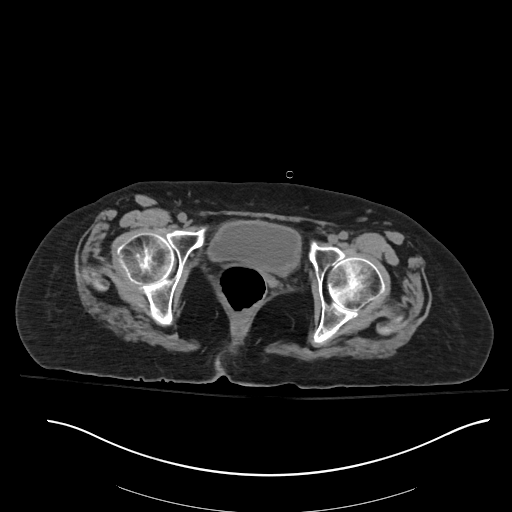
[im 26/94  soft-tissue]
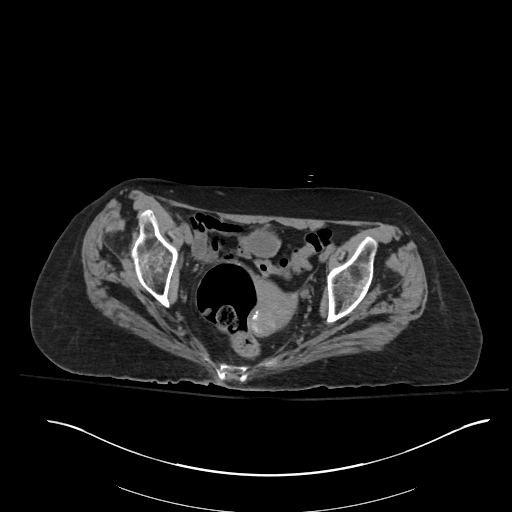
[im 33/94  soft-tissue]
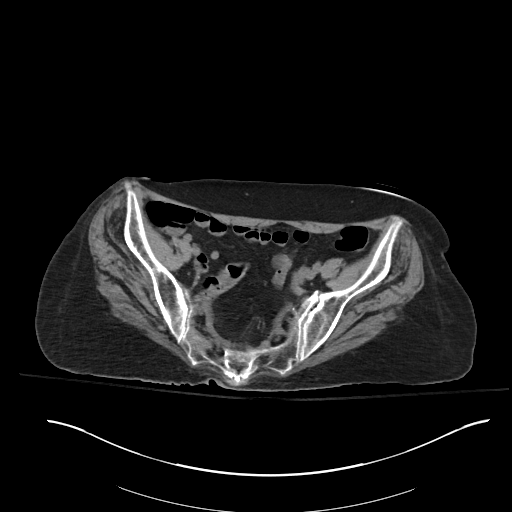
[im 40/94  soft-tissue]
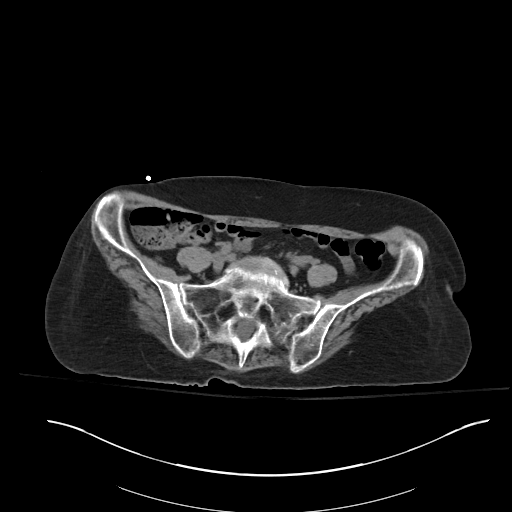
[im 47/94  soft-tissue]
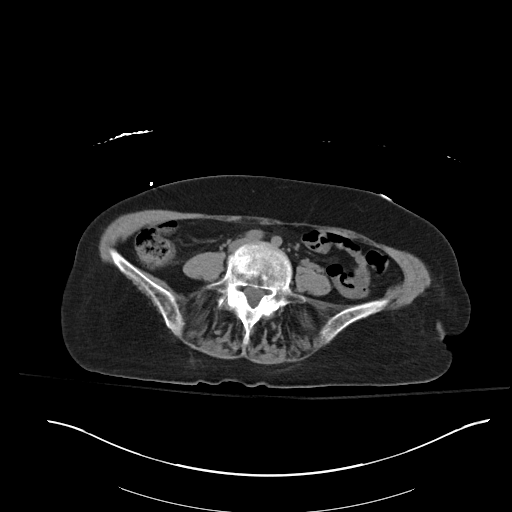
[im 54/94  soft-tissue]
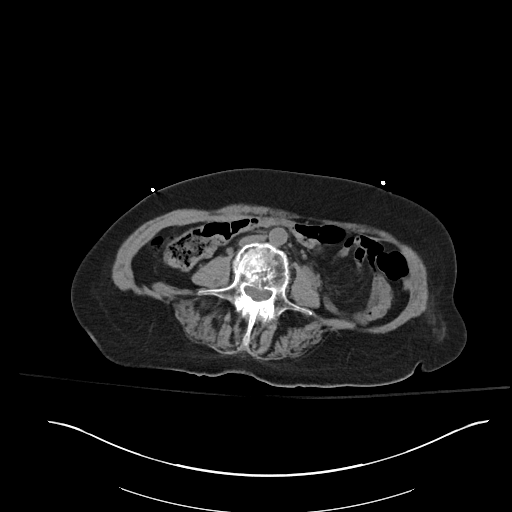
[im 61/94  soft-tissue]
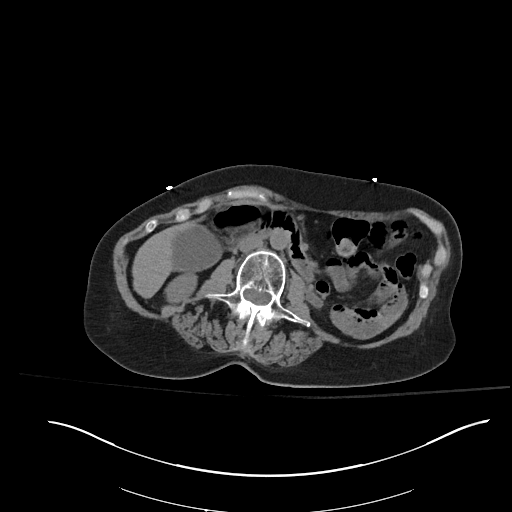
[im 61/94  bone]
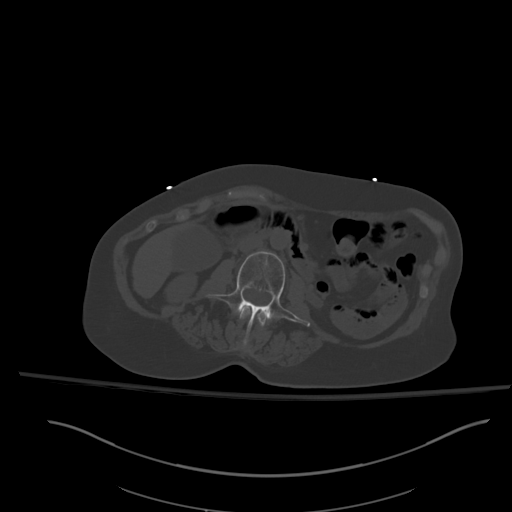
[im 68/94  soft-tissue]
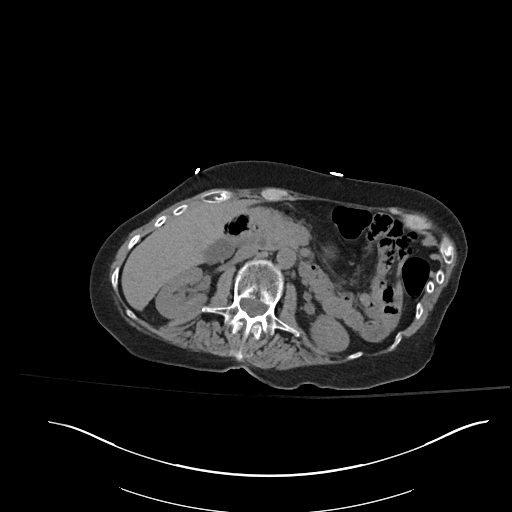
[im 76/94  soft-tissue]
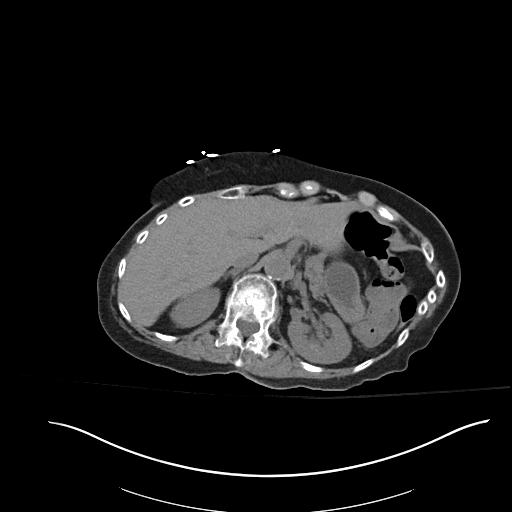
[im 83/94  soft-tissue]
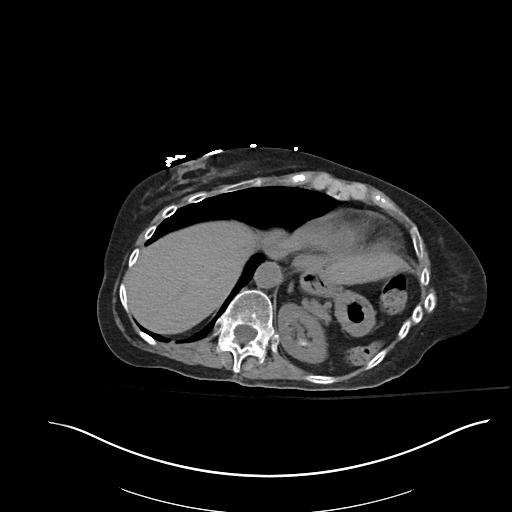
[im 90/94  soft-tissue]
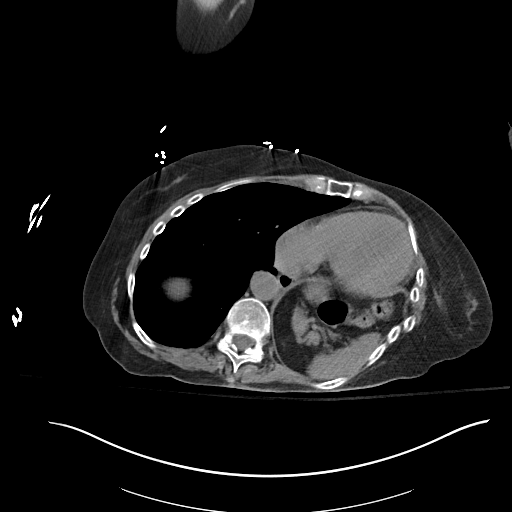

[Series 6: coronal soft tissue · coronal · 0.91mm/px · 3 of 74 slices shown]
[im 25/74  soft-tissue]
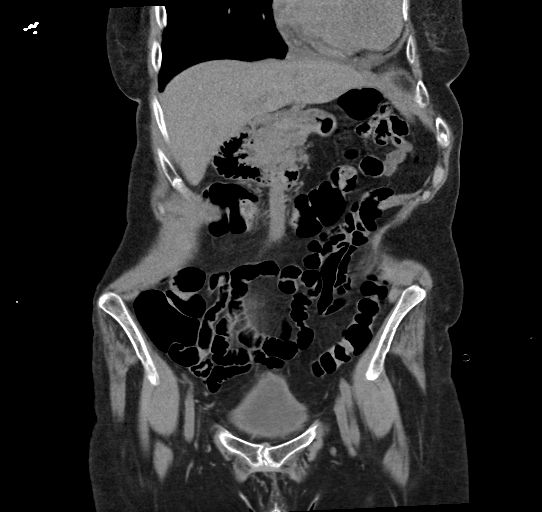
[im 33/74  soft-tissue]
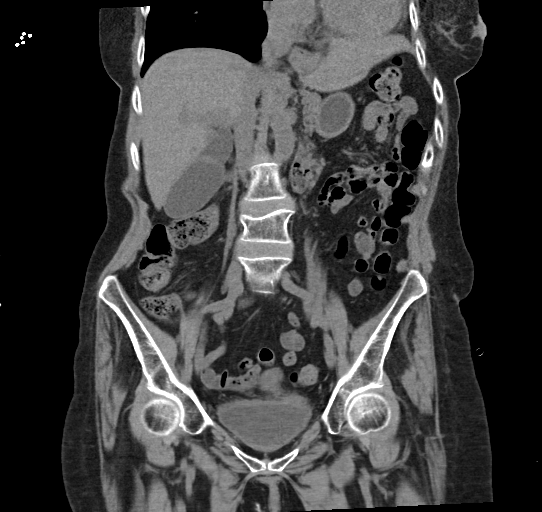
[im 41/74  soft-tissue]
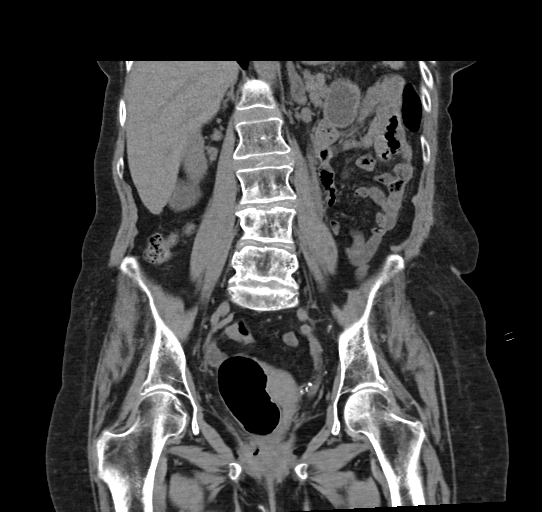

[16 of 46 positions shown; findings below may reference images not displayed]

FINDINGS: Evaluation of this exam is limited in the absence of intravenous
contrast.

Lower chest: There is eventration of the left hemidiaphragm. The
visualized right lung base is clear.

No intra-abdominal free air or free fluid.

Hepatobiliary: No focal liver abnormality is seen. No gallstones,
gallbladder wall thickening, or biliary dilatation.

Pancreas: Choose loss

Spleen: Normal in size without focal abnormality.

Adrenals/Urinary Tract: The adrenal glands are unremarkable. There
is duplication of the left renal collecting system and ureters.
Nonobstructing stone in the upper pole of the left kidney measures
approximately 9 mm. There is a 2 mm nonobstructing left renal
inferior pole calculus. There is mild left hydronephrosis and
hydroureter. No obstructing stone identified. Several nonobstructing
right renal calculi measure up to 5 mm in the inferior pole of the
right kidney. There is no hydronephrosis on the right. The right
ureter appears unremarkable. There is trabeculated appearance of the
bladder wall.

Stomach/Bowel: There is no bowel obstruction or active inflammation.
There is a 3 cm diverticulum of the gastric fundus. No active
inflammatory changes. The appendix is normal.

Vascular/Lymphatic: Mild aortoiliac atherosclerotic disease. The IVC
is unremarkable no venous gas. There is no adenopathy.

Reproductive: The uterus is retroverted. Calcified uterine fibroid
noted. No adnexal masses.

Other: None

Musculoskeletal: Osteopenia with scoliosis and degenerative changes
of the spine. Old L1 and L4 compression fractures. No acute osseous
pathology.
IMPRESSION: 1. Duplicated left renal collecting system and ureters with mild
left hydronephrosis and hydroureter. No obstructing stone
identified. Correlation with urinalysis recommended to exclude UTI.
2. Bilateral nonobstructing renal calculi. No hydronephrosis on the
right.
3. No bowel obstruction. Normal appendix.
4. Aortic Atherosclerosis ([id number]).

## 2023-06-27 ENCOUNTER — Institutional Professional Consult (permissible substitution): Payer: Medicare PPO | Admitting: Student in an Organized Health Care Education/Training Program

## 2023-07-17 ENCOUNTER — Ambulatory Visit: Payer: Medicare PPO | Admitting: Student in an Organized Health Care Education/Training Program

## 2023-07-17 ENCOUNTER — Encounter: Payer: Self-pay | Admitting: Student in an Organized Health Care Education/Training Program

## 2023-07-17 VITALS — BP 116/60 | HR 98 | Temp 97.6°F | Ht 68.0 in | Wt 184.0 lb

## 2023-07-17 DIAGNOSIS — R0602 Shortness of breath: Secondary | ICD-10-CM | POA: Diagnosis not present

## 2023-07-17 DIAGNOSIS — C3432 Malignant neoplasm of lower lobe, left bronchus or lung: Secondary | ICD-10-CM | POA: Diagnosis not present

## 2023-07-17 NOTE — Progress Notes (Signed)
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  She also had resection of brain mets and brain and radiation.  She was previously on erlotinib which was discontinued secondary to side effects.  She presents for the evaluation of shortness of breath in the setting of an upper respiratory tract infection treated conservatively at her nursing facility with improvement in symptoms.  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  Given there is improvement in her symptoms and return to baseline, I will hold off on further workup or interventions at this point.  Patient to return to her nursing facility and follow-up with her oncology providers as previously scheduled.  Return if symptoms worsen or fail to improve.  I spent 48 minutes caring for this patient today, including preparing to see the patient, obtaining a medical history , reviewing a separately obtained history, performing a medically appropriate examination and/or evaluation, counseling and educating the patient/family/caregiver, documenting clinical information in the electronic health record, and independently interpreting results (not separately reported/billed) and communicating results to the patient/family/caregiver  Raechel Chute, MD [facility] 07/17/2023 9:15 PM    End of visit medications:  No orders of the defined types were placed in this encounter.    Current Outpatient Medications:    ACETAMINOPHEN EXTRA STRENGTH 500 MG tablet, Take 1,000 mg by mouth every 8 (eight) hours as needed for moderate pain.,  Disp: , Rfl:    bisacodyl (DULCOLAX) 10 MG suppository, Place 1 suppository (10 mg total) rectally as needed for moderate constipation., Disp: 12 suppository, Rfl: 0   donepezil (ARICEPT) 10 MG tablet, TAKE 1 TABLET BY MOUTH ONCE DAILY IN THE EVENING (Patient taking differently: Take 10 mg by mouth every evening.), Disp: 90 tablet, Rfl: 0   feeding supplement (ENSURE ENLIVE / ENSURE PLUS) LIQD, Take 237 mLs by mouth 2 (two) times daily between meals., Disp: 237 mL, Rfl: 12   hydrocortisone cream 1 %, Apply 1 Application topically 3 (three) times daily., Disp: , Rfl:    Hydrocortisone, Perianal, 1 % CREA, Apply 1 g topically 2 (two) times daily., Disp: , Rfl:    HYDROCORTISONE, TOPICAL, 2 % LOTN, Apply topically 3 (three) times daily., Disp: , Rfl:    ipratropium-albuterol (DUONEB) 0.5-2.5 (3) MG/3ML SOLN, Take 3 mLs by nebulization every 6 (six) hours as needed., Disp: , Rfl:    Magnesium 200 MG TABS, Take 200 mg by mouth daily., Disp: , Rfl:    mirtazapine (REMERON) 7.5 MG tablet, Take 7.5 mg by mouth at bedtime., Disp: , Rfl:    Multiple Vitamin (MULTI-VITAMINS) TABS, Take 1 tablet by mouth every evening. , Disp: , Rfl:    NON FORMULARY, Apply 1 application topically every 4 (four) hours as needed (for rash to buttocks)., Disp: , Rfl:    nystatin powder, Apply 1 Application topically 3 (three) times daily., Disp: , Rfl:    psyllium (METAMUCIL) 58.6 % packet, Take 1 packet by mouth daily., Disp: , Rfl:    saccharomyces boulardii (FLORASTOR) 250 MG capsule, Take 1 capsule (250 mg total) by mouth 2 (two) times daily., Disp: 60 capsule, Rfl:  0   Skin Protectants, Misc. (EUCERIN) cream, Apply 1 Application topically 2 (two) times daily., Disp: , Rfl:    vitamin B-12 (CYANOCOBALAMIN) 1000 MCG tablet, Take 1,000 mcg by mouth every evening., Disp: , Rfl:    Subjective:   PATIENT ID: Valerie [name] GENDER: female DOB: 1945/12/09, MRN: [id number]  Chief Complaint  Patient presents with   Consult     No cough, shortness of breath or wheezing.     HPI  Patient is a pleasant 77 year old female presenting accompanied by her daughter for the evaluation of shortness of breath that is now resolved.  Patient is now back to her usual state of health.  She reports having had symptoms of shortness of breath, cough, wheeze, and sputum production a few weeks ago in the setting of an upper respiratory tract infection.  This was treated with antibiotics and nebulizers with improvement in symptoms.  Given she had already made the appointment, she presents today for an evaluation.  Patient is limited in her mobility and was in a wheelchair.  She denies shortness of breath at rest.  She does not report a cough.  She does not report any chest pain.  Patient was concerned about her cardiac condition reporting that she was told she had blockages in her coronaries in the past.  I have reviewed the patient's medical record and noted history of lung cancer status post left lower lobectomy in 2020.  She has since had metastases to the brain status post CyberKnife resection.  She was found to have EGFR mutation and was started on erlotinib.  This was discontinued secondary to significant side effects.  Patient is currently a resident of a nursing facility.  Patient denies any history of smoking.  Ancillary information including prior medications, full medical/surgical/family/social histories, and PFTs (when available) are listed below and have been reviewed.   Review of Systems  Constitutional:  Positive for malaise/fatigue and weight loss. Negative for chills and fever.  Respiratory:  Negative for cough, hemoptysis, sputum production, shortness of breath and wheezing.   Cardiovascular:  Negative for chest pain and leg swelling.     Objective:   Vitals:   07/17/23 1420  BP: 116/60  Pulse: 98  Temp: 97.6 F (36.4 C)  TempSrc: Temporal  SpO2: 98%  Weight: 184 lb (83.5 kg)  Height: 5\' 8"  (1.727 m)    98% on RA BMI Readings from Last 3 Encounters:  07/17/23 27.98 kg/m  10/05/21 25.01 kg/m  07/18/21 23.63 kg/m   Wt Readings from Last 3 Encounters:  07/17/23 184 lb (83.5 kg)  10/01/21 164 lb 7.4 oz (74.6 kg)  04/22/21 155 lb 6.8 oz (70.5 kg)    Physical Exam Constitutional:      General: She is not in acute distress.    Appearance: She is ill-appearing.  Cardiovascular:     Rate and Rhythm: Normal rate and regular rhythm.     Pulses: Normal pulses.     Heart sounds: Normal heart sounds.  Pulmonary:     Effort: Pulmonary effort is normal.     Breath sounds: Rales present. No wheezing or rhonchi.  Abdominal:     Palpations: Abdomen is soft.  Neurological:     General: No focal deficit present.     Mental Status: She is alert and oriented to person, place, and time. Mental status is at baseline.       Ancillary Information    Past Medical History:  Diagnosis Date   Abnormal Pap  smear 2006   Anal fistula    Ankle fracture    Anxiety    Arthritis    Atrophic vaginitis 2008   Cataract    Dementia (HCC) 2009   Dyspareunia 2008   H/O osteoporosis    H/O varicella    H/O vitamin D deficiency    Headache 07/24/2016   Heart murmur    History of measles, mumps, or rubella    History of radiation therapy 07/28/13- 08/10/13   right lung metastasis 5000 cGy 10 sessions   Hypertension    Lung cancer (HCC) dx'd 2002   Lung cancer (HCC)    Lung cancer (HCC)    Lung metastasis    PET scan 05/05/13, RUL lung nodule   Metastasis to brain [facility]) dx'd 2008   Metastasis to lymph nodes (HCC) dx'd 09/2011   Nodule of right lung CT- 06/03/12   RIGHT UPPER LOBE   Nodule of right lung 06/03/12   Upper Lobe   On antineoplastic chemotherapy    TARCEVA   Osteoporosis 2010   Primary cancer of right upper lobe of lung (HCC) 04/22/2009   Qualifier: Diagnosis of  By: Koleen Distance CMA (AAMA), Leisha     Shortness of breath    hx lung ca    Status post chemotherapy 2003    CARBOPLATIN/PACLITAXEL /STATUS POST CLINICAL TRAIL OF CELEBREX AND IRESSA AT [facility] FOR 1 YEAR   Status post radiation therapy 2003   LEFT LUNG   Status post radiation therapy 11/07/2005   WHOLE BRAIN: DR Jill Alexanders [facility]   Status post radiation therapy 06/02/2008   GAMMA KNIFE OF RESECTED CAVITAY   Thyroid adenoma    ?   Thyroid cancer (HCC) 10/18/11 bx   adenoid nodules    Yeast infection      Family History  Problem Relation Age of Onset   Gait disorder Mother    Cancer Mother        Meningioma   Cancer Father        Pancreatic   Heart failure Sister    Cancer Maternal Noah Charon     Past Surgical History:  Procedure Laterality Date   BRAIN SURGERY     CORONARY ULTRASOUND/IVUS N/A 02/16/2020   Procedure: Intravascular Ultrasound/IVUS;  Surgeon: Marykay Lex, MD;  Location: [facility] INVASIVE CV LAB;  Service: Cardiovascular;  Laterality: N/A;   LEFT LOWER LOBECTOMY  05/2001   LEFT OCCIPITAL CRANIOTOMY  09/21/2005   tumor   LUNG LOBECTOMY     NECK SURGERY     ORIF ANKLE FRACTURE Left 10/02/2014   Procedure: OPEN REDUCTION INTERNAL FIXATION (ORIF) ANKLE FRACTURE;  Surgeon: Harvie Junior, MD;  Location: [facility] ORS;  Service: Orthopedics;  Laterality: Left;   ORIF FEMUR FRACTURE Right 06/11/2019   Procedure: OPEN REDUCTION INTERNAL FIXATION (ORIF) DISTAL FEMUR FRACTURE;  Surgeon: Sheral Apley, MD;  Location: [facility] OR;  Service: Orthopedics;  Laterality: Right;   ORIF TIBIA PLATEAU Right 06/11/2019   Procedure: Open Reduction Internal Fixation (Orif) Tibial Plateau;  Surgeon: Sheral Apley, MD;  Location[facility] OR;  Service: Orthopedics;  Laterality: Right;   RADICAL NECK DISSECTION  10/18/2011   Procedure: RADICAL NECK DISSECTION;  Surgeon: Serena Colonel, MD;  Location: [facility] OR;  Service: ENT;  Laterality: Right;  RIGHT MODIFIED NECK DISSECTION /POSSIBLE RIGHT THYROIDECTOM   RIGHT/LEFT HEART CATH AND CORONARY ANGIOGRAPHY N/A 02/16/2020   Procedure: RIGHT/LEFT HEART CATH AND CORONARY  ANGIOGRAPHY;  Surgeon: Bryan Lemma  W, MD;  Location: [facility] INVASIVE CV LAB;  Service: Cardiovascular;  Laterality: N/A;   TEE WITHOUT CARDIOVERSION N/A 03/28/2020   Procedure: TRANSESOPHAGEAL ECHOCARDIOGRAM (TEE);  Surgeon: Jodelle Red, MD;  Location: [facility] ENDOSCOPY;  Service: Cardiovascular;  Laterality: N/A;   THYROIDECTOMY  10/18/2011   Procedure: THYROIDECTOMY WITH RADICAL NECK DISSECTION;  Surgeon: Serena Colonel, MD;  Location: [facility] OR;  Service: ENT;  Laterality: Right;    Social History   Socioeconomic History   Marital status: Widowed    Spouse name: Not on file   Number of children: 2   Years of education: Not on file   Highest education level: Not on file  Occupational History   Occupation: Runner, broadcasting/film/video    Comment: retired after 34 years,from [employer]  Tobacco Use   Smoking status: Never   Smokeless tobacco: Never   Tobacco comments:    smoked few years in college  Vaping Use   Vaping status: Never Used  Substance and Sexual Activity   Alcohol use: No   Drug use: No   Sexual activity: Yes    Birth control/protection: Post-menopausal  Other Topics Concern   Not on file  Social History Narrative   ** Merged History Encounter **       Social Determinants of Health   Financial Resource Strain: Low Risk  (09/14/2020)   Overall Financial Resource Strain (CARDIA)    Difficulty of Paying Living Expenses: Not hard at all  Food Insecurity: No Food Insecurity (09/14/2020)   Hunger Vital Sign    Worried About Running Out of Food in the Last Year: Never true    Ran Out of Food in the Last Year: Never true  Transportation Needs: No Transportation Needs (09/14/2020)   PRAPARE - Administrator, Civil Service (Medical): No    Lack of Transportation (Non-Medical): No  Physical Activity: Inactive (09/14/2020)   Exercise Vital Sign    Days of Exercise per Week: 0 days    Minutes of Exercise per Session: 0 min  Stress: No Stress Concern Present (09/14/2020)    [employer] of Occupational Health - Occupational Stress Questionnaire    Feeling of Stress : Not at all  Social Connections: Not on file  Intimate Partner Violence: Not At Risk (10/16/2018)   Humiliation, Afraid, Rape, and Kick questionnaire    Fear of Current or Ex-Partner: No    Emotionally Abused: No    Physically Abused: No    Sexually Abused: No     Allergies  Allergen Reactions   Broccoli [Brassica Oleracea] Other (See Comments)   Fruit Extracts Other (See Comments)    unknown   Sulfa Antibiotics Other (See Comments)    She cant remember  what happens     CBC    Component Value Date/Time   WBC 7.4 03/01/2023 1015   WBC 8.8 07/06/2022 1426   RBC 4.28 03/01/2023 1015   HGB 12.6 03/01/2023 1015   HGB 12.7 07/18/2021 1421   HGB 13.1 08/06/2017 1047   HCT 40.3 03/01/2023 1015   HCT 39.8 07/18/2021 1421   HCT 40.9 08/06/2017 1047   PLT 266 03/01/2023 1015   PLT 347 07/18/2021 1421   MCV 94.2 03/01/2023 1015   MCV 88 07/18/2021 1421   MCV 91.9 08/06/2017 1047   MCH 29.4 03/01/2023 1015   MCHC 31.3 03/01/2023 1015   RDW 14.6 03/01/2023 1015   RDW 12.9 07/18/2021 1421   RDW 14.7 (H) 08/06/2017 1047   LYMPHSABS 1.1 03/01/2023 1015  LYMPHSABS 1.1 07/18/2021 1421   LYMPHSABS 0.7 (L) 08/06/2017 1047   MONOABS 0.5 03/01/2023 1015   MONOABS 0.3 08/06/2017 1047   EOSABS 0.1 03/01/2023 1015   EOSABS 0.1 07/18/2021 1421   BASOSABS 0.0 03/01/2023 1015   BASOSABS 0.0 07/18/2021 1421   BASOSABS 0.0 08/06/2017 1047    Pulmonary Functions Testing Results:     No data to display          Outpatient Medications Prior to Visit  Medication Sig Dispense Refill   ACETAMINOPHEN EXTRA STRENGTH 500 MG tablet Take 1,000 mg by mouth every 8 (eight) hours as needed for moderate pain.     bisacodyl (DULCOLAX) 10 MG suppository Place 1 suppository (10 mg total) rectally as needed for moderate constipation. 12 suppository 0   donepezil (ARICEPT) 10 MG tablet TAKE 1 TABLET BY  MOUTH ONCE DAILY IN THE EVENING (Patient taking differently: Take 10 mg by mouth every evening.) 90 tablet 0   feeding supplement (ENSURE ENLIVE / ENSURE PLUS) LIQD Take 237 mLs by mouth 2 (two) times daily between meals. 237 mL 12   hydrocortisone cream 1 % Apply 1 Application topically 3 (three) times daily.     Hydrocortisone, Perianal, 1 % CREA Apply 1 g topically 2 (two) times daily.     HYDROCORTISONE, TOPICAL, 2 % LOTN Apply topically 3 (three) times daily.     ipratropium-albuterol (DUONEB) 0.5-2.5 (3) MG/3ML SOLN Take 3 mLs by nebulization every 6 (six) hours as needed.     Magnesium 200 MG TABS Take 200 mg by mouth daily.     mirtazapine (REMERON) 7.5 MG tablet Take 7.5 mg by mouth at bedtime.     Multiple Vitamin (MULTI-VITAMINS) TABS Take 1 tablet by mouth every evening.      NON FORMULARY Apply 1 application topically every 4 (four) hours as needed (for rash to buttocks).     nystatin powder Apply 1 Application topically 3 (three) times daily.     psyllium (METAMUCIL) 58.6 % packet Take 1 packet by mouth daily.     saccharomyces boulardii (FLORASTOR) 250 MG capsule Take 1 capsule (250 mg total) by mouth 2 (two) times daily. 60 capsule 0   Skin Protectants, Misc. (EUCERIN) cream Apply 1 Application topically 2 (two) times daily.     vitamin B-12 (CYANOCOBALAMIN) 1000 MCG tablet Take 1,000 mcg by mouth every evening.     Ascorbic Acid (VITAMIN C) 100 MG tablet Take 100 mg by mouth every evening. Gummie     cephALEXin (KEFLEX) 500 MG capsule Take 1 capsule (500 mg total) by mouth 2 (two) times daily. 14 capsule 0   hyoscyamine (LEVSIN) 0.125 MG tablet SMARTSIG:1 Tablet(s) By Mouth Every 12 Hours (Patient not taking: Reported on 01/03/2022)     loperamide (IMODIUM) 2 MG capsule Take 2 capsules (4 mg total) by mouth every 8 (eight) hours as needed (diarrhea). 30 capsule 0   ondansetron (ZOFRAN) 4 MG tablet Take 1 tablet (4 mg total) by mouth every 8 (eight) hours as needed for nausea or  vomiting. 20 tablet 0   polyethylene glycol (MIRALAX) 17 g packet Take 17 g by mouth 2 (two) times daily. 14 each 0   PREPARATION H 0.25-14-74.9 % rectal ointment Place 1 application. rectally 2 (two) times daily as needed for hemorrhoids.     prochlorperazine (COMPAZINE) 10 MG tablet Take 1 tablet (10 mg total) by mouth every 6 (six) hours as needed for nausea or vomiting. 30 tablet 0   vitamin C (  ASCORBIC ACID) 250 MG tablet Take 250 mg by mouth daily.     Zinc Oxide (DESITIN) 13 % CREA Apply 1 application topically 2 (two) times daily. (Patient taking differently: Apply 1 application. topically 2 (two) times daily. To groin area) 113 g 0   No facility-administered medications prior to visit.

## 2023-08-18 IMAGING — CR DG CHEST 2V
2 series · 2 of 2 positions shown · non-contrast
Comparison: Chest x-ray 06/03/2020.  Chest CT 02/23/2021.

CLINICAL DATA: Wheezing.

EXAM:
CHEST - 2 VIEW

[w chest lat]
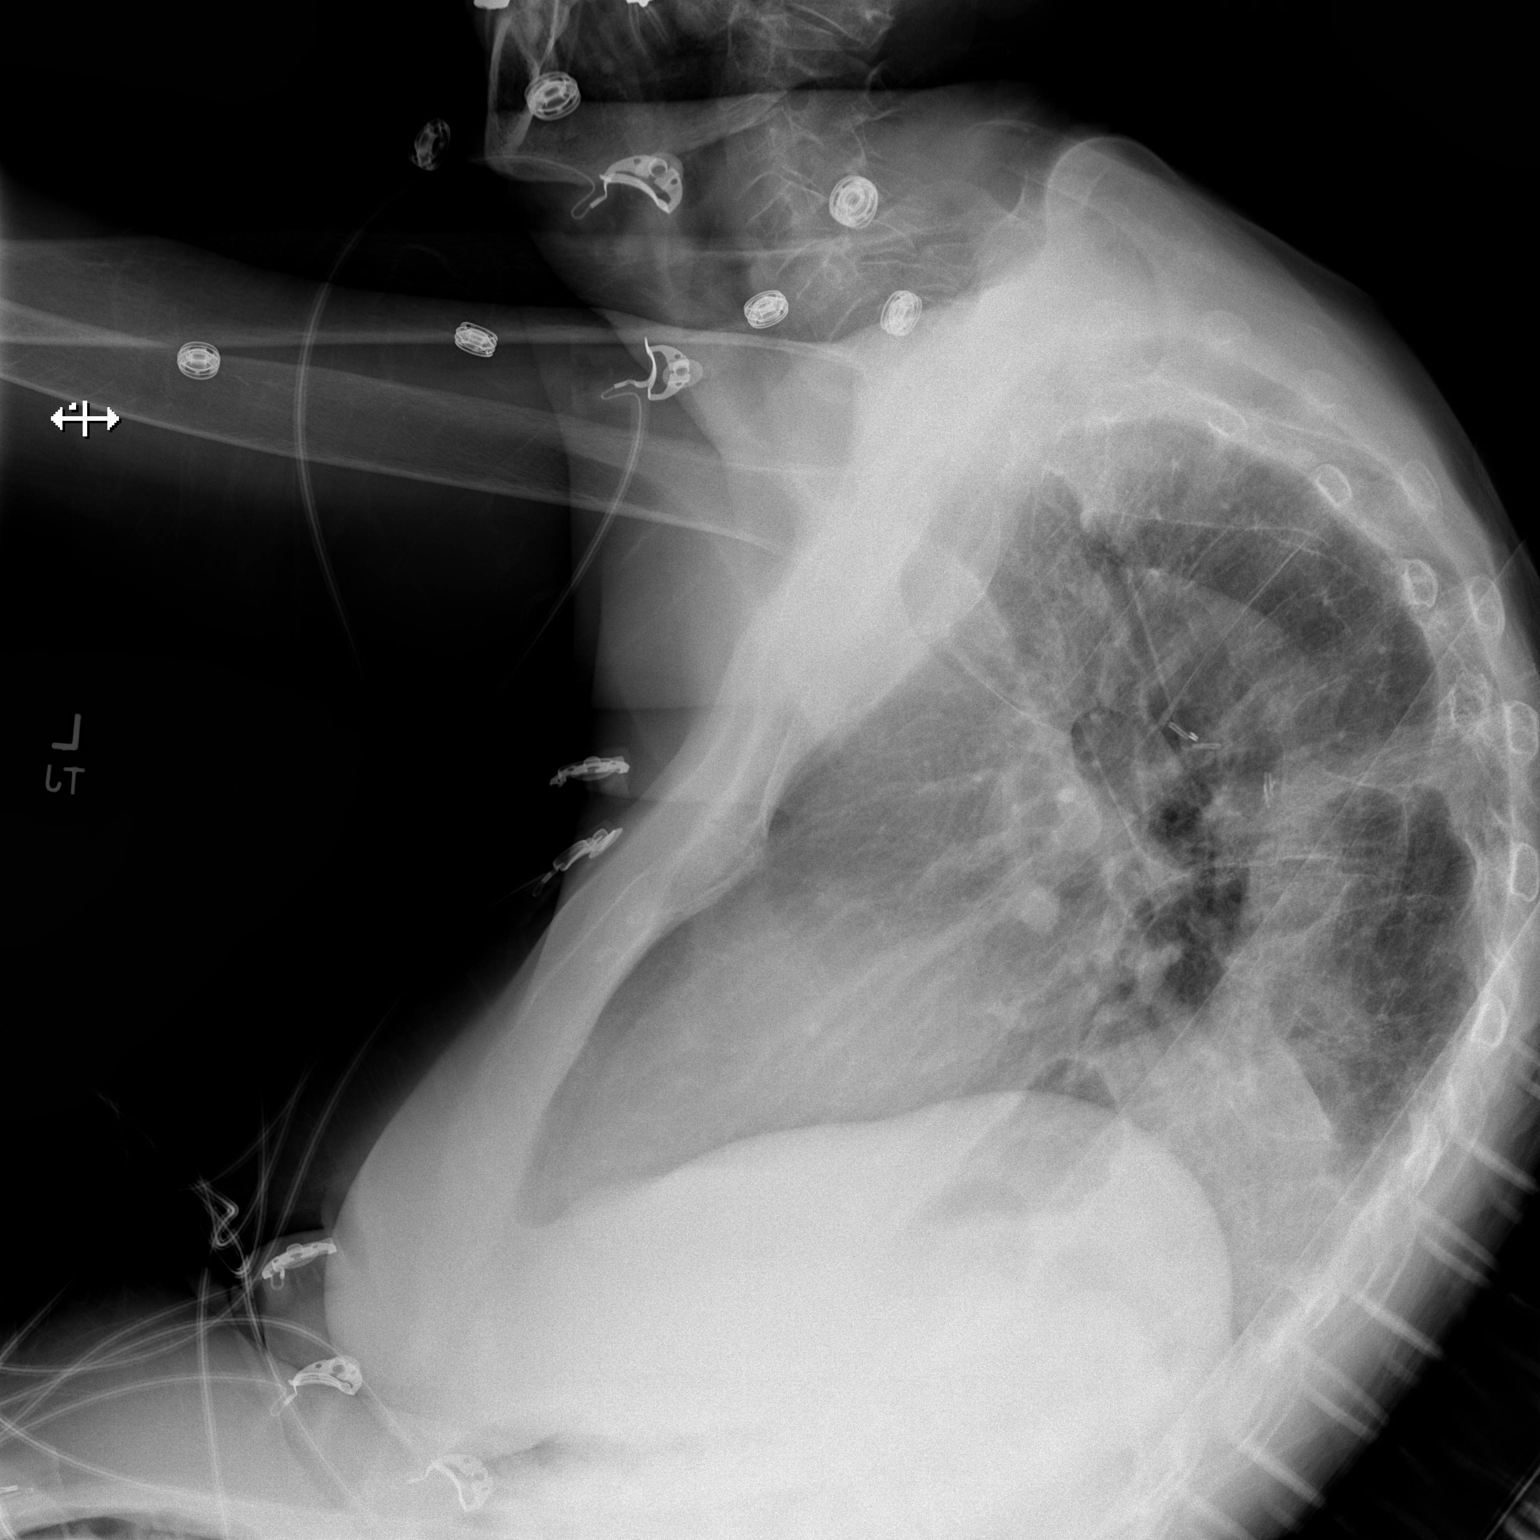

[x chest ap]
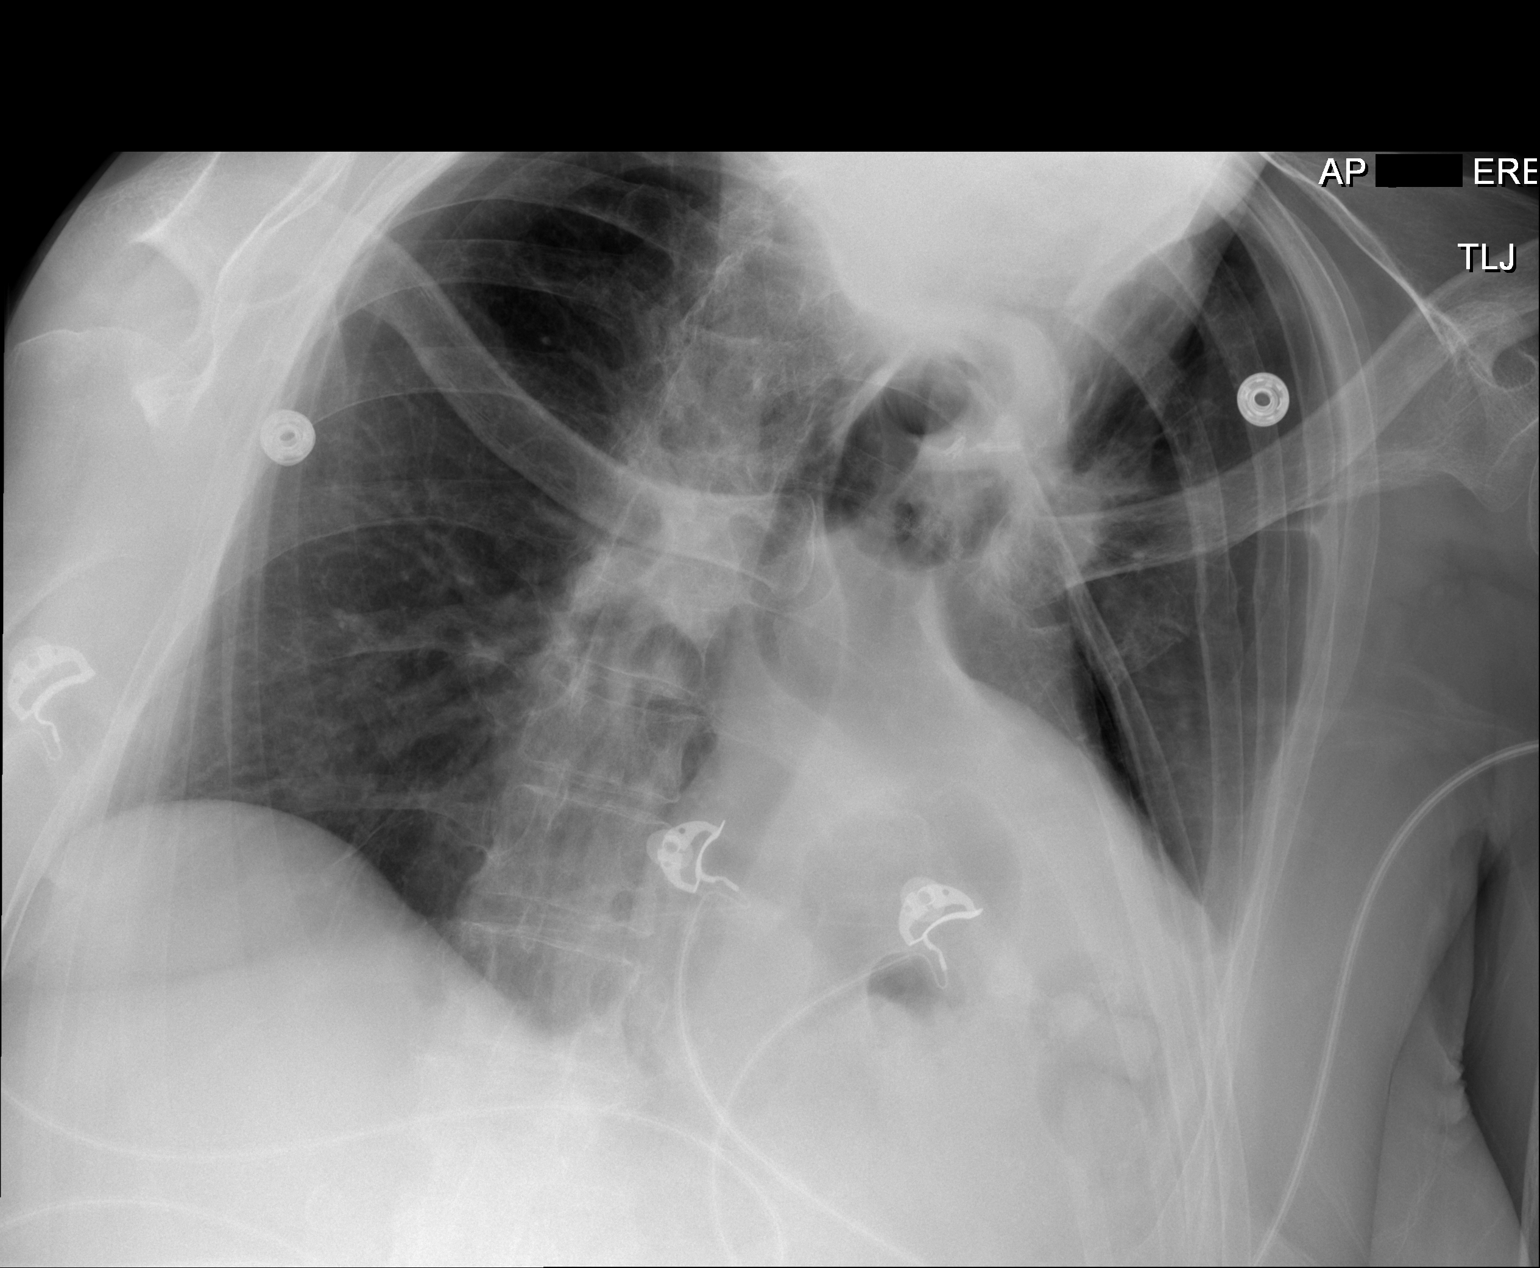

[2 of 2 positions shown; findings below may reference images not displayed]

FINDINGS: There is moderate elevation of the left hemidiaphragm similar to the
prior study. The cardiomediastinal silhouette is grossly within
normal limits given degree of patient rotation. There is no new
focal lung infiltrate or pneumothorax identified. Small left pleural
effusion is unchanged. There is some linear scarring in the superior
segment of the left lower lobe seen best on the lateral view similar
to the prior CT. The right lung is grossly clear. Healed left rib
fractures are again noted.
IMPRESSION: 1. Stable examination. Small left pleural effusion with linear
scarring in the superior segment of the left lower lobe.

## 2023-09-03 ENCOUNTER — Inpatient Hospital Stay (HOSPITAL_COMMUNITY)
Admission: EM | Admit: 2023-09-03 | Discharge: 2023-09-07 | DRG: 065 | Disposition: A | Discharge status: Skilled Nursing Facility | Payer: Medicare PPO | Source: Skilled Nursing Facility | Attending: Internal Medicine | Admitting: Internal Medicine

## 2023-09-03 ENCOUNTER — Emergency Department (HOSPITAL_COMMUNITY): Payer: Medicare PPO

## 2023-09-03 ENCOUNTER — Other Ambulatory Visit: Payer: Self-pay

## 2023-09-03 ENCOUNTER — Encounter (HOSPITAL_COMMUNITY): Payer: Self-pay

## 2023-09-03 DIAGNOSIS — E440 Moderate protein-calorie malnutrition: Secondary | ICD-10-CM | POA: Diagnosis present

## 2023-09-03 DIAGNOSIS — I110 Hypertensive heart disease with heart failure: Secondary | ICD-10-CM | POA: Diagnosis present

## 2023-09-03 DIAGNOSIS — E890 Postprocedural hypothyroidism: Secondary | ICD-10-CM | POA: Diagnosis present

## 2023-09-03 DIAGNOSIS — R29712 NIHSS score 12: Secondary | ICD-10-CM | POA: Diagnosis present

## 2023-09-03 DIAGNOSIS — Z6824 Body mass index (BMI) 24.0-24.9, adult: Secondary | ICD-10-CM

## 2023-09-03 DIAGNOSIS — Z993 Dependence on wheelchair: Secondary | ICD-10-CM

## 2023-09-03 DIAGNOSIS — Z882 Allergy status to sulfonamides status: Secondary | ICD-10-CM

## 2023-09-03 DIAGNOSIS — E785 Hyperlipidemia, unspecified: Secondary | ICD-10-CM | POA: Diagnosis present

## 2023-09-03 DIAGNOSIS — I6340 Cerebral infarction due to embolism of unspecified cerebral artery: Secondary | ICD-10-CM | POA: Diagnosis not present

## 2023-09-03 DIAGNOSIS — Z8249 Family history of ischemic heart disease and other diseases of the circulatory system: Secondary | ICD-10-CM

## 2023-09-03 DIAGNOSIS — Z9221 Personal history of antineoplastic chemotherapy: Secondary | ICD-10-CM

## 2023-09-03 DIAGNOSIS — D6859 Other primary thrombophilia: Secondary | ICD-10-CM | POA: Diagnosis present

## 2023-09-03 DIAGNOSIS — Z923 Personal history of irradiation: Secondary | ICD-10-CM

## 2023-09-03 DIAGNOSIS — K5900 Constipation, unspecified: Secondary | ICD-10-CM | POA: Diagnosis present

## 2023-09-03 DIAGNOSIS — Z79899 Other long term (current) drug therapy: Secondary | ICD-10-CM

## 2023-09-03 DIAGNOSIS — C719 Malignant neoplasm of brain, unspecified: Secondary | ICD-10-CM | POA: Diagnosis present

## 2023-09-03 DIAGNOSIS — R9431 Abnormal electrocardiogram [ECG] [EKG]: Secondary | ICD-10-CM | POA: Diagnosis present

## 2023-09-03 DIAGNOSIS — I639 Cerebral infarction, unspecified: Principal | ICD-10-CM | POA: Diagnosis present

## 2023-09-03 DIAGNOSIS — F0390 Unspecified dementia without behavioral disturbance: Secondary | ICD-10-CM | POA: Diagnosis present

## 2023-09-03 DIAGNOSIS — I480 Paroxysmal atrial fibrillation: Secondary | ICD-10-CM | POA: Diagnosis present

## 2023-09-03 DIAGNOSIS — Z7901 Long term (current) use of anticoagulants: Secondary | ICD-10-CM

## 2023-09-03 DIAGNOSIS — Z7982 Long term (current) use of aspirin: Secondary | ICD-10-CM

## 2023-09-03 DIAGNOSIS — Z85118 Personal history of other malignant neoplasm of bronchus and lung: Secondary | ICD-10-CM

## 2023-09-03 DIAGNOSIS — Z8673 Personal history of transient ischemic attack (TIA), and cerebral infarction without residual deficits: Secondary | ICD-10-CM

## 2023-09-03 DIAGNOSIS — Z800 Family history of malignant neoplasm of digestive organs: Secondary | ICD-10-CM

## 2023-09-03 DIAGNOSIS — I5022 Chronic systolic (congestive) heart failure: Secondary | ICD-10-CM | POA: Diagnosis present

## 2023-09-03 DIAGNOSIS — Z85841 Personal history of malignant neoplasm of brain: Secondary | ICD-10-CM

## 2023-09-03 DIAGNOSIS — M810 Age-related osteoporosis without current pathological fracture: Secondary | ICD-10-CM | POA: Diagnosis present

## 2023-09-03 DIAGNOSIS — Z808 Family history of malignant neoplasm of other organs or systems: Secondary | ICD-10-CM

## 2023-09-03 DIAGNOSIS — Z66 Do not resuscitate: Secondary | ICD-10-CM | POA: Diagnosis present

## 2023-09-03 DIAGNOSIS — Z7902 Long term (current) use of antithrombotics/antiplatelets: Secondary | ICD-10-CM

## 2023-09-03 DIAGNOSIS — G8194 Hemiplegia, unspecified affecting left nondominant side: Secondary | ICD-10-CM | POA: Diagnosis present

## 2023-09-03 DIAGNOSIS — C799 Secondary malignant neoplasm of unspecified site: Secondary | ICD-10-CM | POA: Diagnosis present

## 2023-09-03 DIAGNOSIS — Z85850 Personal history of malignant neoplasm of thyroid: Secondary | ICD-10-CM

## 2023-09-03 DIAGNOSIS — Z91018 Allergy to other foods: Secondary | ICD-10-CM

## 2023-09-03 LAB — DIFFERENTIAL
Abs Immature Granulocytes: 0.02 10*3/uL (ref 0.00–0.07)
Basophils Absolute: 0 10*3/uL (ref 0.0–0.1)
Basophils Relative: 0 %
Eosinophils Absolute: 0.2 10*3/uL (ref 0.0–0.5)
Eosinophils Relative: 2 %
Immature Granulocytes: 0 %
Lymphocytes Relative: 21 %
Lymphs Abs: 1.6 10*3/uL (ref 0.7–4.0)
Monocytes Absolute: 0.8 10*3/uL (ref 0.1–1.0)
Monocytes Relative: 10 %
Neutro Abs: 5.3 10*3/uL (ref 1.7–7.7)
Neutrophils Relative %: 67 %

## 2023-09-03 LAB — COMPREHENSIVE METABOLIC PANEL
ALT: 14 U/L (ref 0–44)
AST: 21 U/L (ref 15–41)
Albumin: 3.6 g/dL (ref 3.5–5.0)
Alkaline Phosphatase: 76 U/L (ref 38–126)
Anion gap: 12 (ref 5–15)
BUN: 20 mg/dL (ref 8–23)
CO2: 23 mmol/L (ref 22–32)
Calcium: 9.7 mg/dL (ref 8.9–10.3)
Chloride: 103 mmol/L (ref 98–111)
Creatinine, Ser: 0.95 mg/dL (ref 0.44–1.00)
GFR, Estimated: >60 mL/min (ref >60)
Glucose, Bld: 97 mg/dL (ref 70–99)
Potassium: 4.7 mmol/L (ref 3.5–5.1)
Sodium: 138 mmol/L (ref 135–145)
Total Bilirubin: 0.6 mg/dL (ref 0.0–1.2)
Total Protein: 6.8 g/dL (ref 6.5–8.1)

## 2023-09-03 LAB — I-STAT CHEM 8, ED
BUN: 23 mg/dL (ref 8–23)
Calcium, Ion: 1.19 mmol/L (ref 1.15–1.40)
Chloride: 104 mmol/L (ref 98–111)
Creatinine, Ser: 0.9 mg/dL (ref 0.44–1.00)
Glucose, Bld: 92 mg/dL (ref 70–99)
HCT: 41 % (ref 36.0–46.0)
Hemoglobin: 13.9 g/dL (ref 12.0–15.0)
Potassium: 4.6 mmol/L (ref 3.5–5.1)
Sodium: 137 mmol/L (ref 135–145)
TCO2: 25 mmol/L (ref 22–32)

## 2023-09-03 LAB — CBC
HCT: 42 % (ref 36.0–46.0)
Hemoglobin: 13.1 g/dL (ref 12.0–15.0)
MCH: 29.8 pg (ref 26.0–34.0)
MCHC: 31.2 g/dL (ref 30.0–36.0)
MCV: 95.7 fL (ref 80.0–100.0)
Platelets: 268 10*3/uL (ref 150–400)
RBC: 4.39 MIL/uL (ref 3.87–5.11)
RDW: 13.7 % (ref 11.5–15.5)
WBC: 8 10*3/uL (ref 4.0–10.5)
nRBC: 0 % (ref 0.0–0.2)

## 2023-09-03 LAB — APTT: aPTT: 28 s (ref 24–36)

## 2023-09-03 LAB — PROTIME-INR
INR: 1.1 (ref 0.8–1.2)
Prothrombin Time: 14 s (ref 11.4–15.2)

## 2023-09-03 LAB — ETHANOL: Alcohol, Ethyl (B): <10 mg/dL (ref <10)

## 2023-09-03 MED ORDER — SODIUM CHLORIDE 0.9% FLUSH: 3.0000 mL | Freq: Once | INTRAVENOUS | Status: DC

## 2023-09-03 NOTE — Discharge Planning (Signed)
 RNCM escorted pt daughter, Sinclair Grooms to pt bedside.

## 2023-09-03 NOTE — ED Provider Notes (Signed)
 [place] EMERGENCY DEPARTMENT AT [facility] Provider Note   CSN: [id number] Arrival date & time: 09/03/23  1312     History  Chief Complaint  Patient presents with   Weakness    Valerie [name] is a 78 y.o. female.  Patient is a 78 year old female who presents with left-sided weakness.  She states it started about 2 to 3 days ago.  She noticed some weakness in her left arm and left leg.  She is nonambulatory at baseline but says her left leg felt weaker than normal.  Her daughter noted some slurred speech as well yesterday.  She was drooling out of the side of her mouth.  She has not had any recent falls.  No fevers.  No cough or cold symptoms other than she does have a chronic cough.  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       Home Medications Prior to Admission medications   Medication Sig Start Date End Date Taking? Authorizing Provider  ACETAMINOPHEN  EXTRA STRENGTH 500 MG tablet Take 1,000 mg by mouth every 8 (eight) hours as needed for moderate pain. 08/28/21   [provider]  bisacodyl  (DULCOLAX) 10 MG suppository Place 1 suppository (10 mg total) rectally as needed for moderate constipation. 07/06/22   Randol Simmonds, MD  donepezil  (ARICEPT ) 10 MG tablet TAKE 1 TABLET BY MOUTH ONCE DAILY IN THE EVENING Patient taking differently: Take 10 mg by mouth every evening. 04/03/21   Jarold Medici, MD  feeding supplement (ENSURE ENLIVE / ENSURE PLUS) LIQD Take 237 mLs by mouth 2 (two) times daily between meals. 10/04/21   Adhikari, Amrit, MD  hydrocortisone  cream 1 % Apply 1 Application topically 3 (three) times daily.    [provider]  Hydrocortisone , Perianal, 1 % CREA Apply 1 g topically 2 (two) times daily. 01/02/22   [provider]  HYDROCORTISONE , TOPICAL, 2 % LOTN Apply topically 3 (three) times daily.    [provider]  ipratropium-albuterol  (DUONEB) 0.5-2.5 (3) MG/3ML SOLN Take 3 mLs by  nebulization every 6 (six) hours as needed. 06/21/23   [provider]  Magnesium  200 MG TABS Take 200 mg by mouth daily.    [provider]  mirtazapine  (REMERON ) 7.5 MG tablet Take 7.5 mg by mouth at bedtime. 07/11/23   [provider]  Multiple Vitamin (MULTI-VITAMINS) TABS Take 1 tablet by mouth every evening.     [provider]  NON FORMULARY Apply 1 application topically every 4 (four) hours as needed (for rash to buttocks).    [provider]  nystatin  powder Apply 1 Application topically 3 (three) times daily.    [provider]  psyllium (METAMUCIL) 58.6 % packet Take 1 packet by mouth daily.    [provider]  saccharomyces boulardii (FLORASTOR) 250 MG capsule Take 1 capsule (250 mg total) by mouth 2 (two) times daily. 10/04/21   Jillian Buttery, MD  Skin Protectants, Misc. (EUCERIN) cream Apply 1 Application topically 2 (two) times daily.    [provider]  vitamin B-12 (CYANOCOBALAMIN ) 1000 MCG tablet Take 1,000 mcg by mouth every evening.    [provider]      Allergies    Broccoli [brassica oleracea], Fruit extracts, and Sulfa  antibiotics    Review of Systems   Review of Systems  Constitutional:  Negative for chills, diaphoresis, fatigue and fever.  HENT:  Negative for congestion, rhinorrhea and sneezing.   Eyes: Negative.  Respiratory:  Positive for cough (Chronic). Negative for chest tightness and shortness of breath.   Cardiovascular:  Negative for chest pain and leg swelling.  Gastrointestinal:  Negative for abdominal pain, blood in stool, diarrhea, nausea and vomiting.  Genitourinary:  Negative for difficulty urinating, flank pain, frequency and hematuria.  Musculoskeletal:  Negative for arthralgias and back pain.  Skin:  Negative for rash.  Neurological:  Positive for speech difficulty, weakness and numbness. Negative for dizziness and headaches.    Physical Exam Updated Vital  Signs BP 129/65   Pulse 88   Temp 98.7 F (37.1 C)   Resp 17   SpO2 96%  Physical Exam Constitutional:      Appearance: She is well-developed.  HENT:     Head: Normocephalic and atraumatic.  Eyes:     Pupils: Pupils are equal, round, and reactive to light.  Cardiovascular:     Rate and Rhythm: Normal rate and regular rhythm.     Heart sounds: Murmur heard.  Pulmonary:     Effort: Pulmonary effort is normal. No respiratory distress.     Breath sounds: Normal breath sounds. No wheezing or rales.  Chest:     Chest wall: No tenderness.  Abdominal:     General: Bowel sounds are normal.     Palpations: Abdomen is soft.     Tenderness: There is no abdominal tenderness. There is no guarding or rebound.  Musculoskeletal:        General: Normal range of motion.     Cervical back: Normal range of motion and neck supple.  Lymphadenopathy:     Cervical: No cervical adenopathy.  Skin:    General: Skin is warm and dry.     Findings: No rash.  Neurological:     Mental Status: She is alert and oriented to person, place, and time.     Comments: Positive mild slurred speech.  She has weakness in her left arm and left leg as compared to the right.  She has some subjective numbness to to these areas as compared to the right.     ED Results / Procedures / Treatments   Labs (all labs ordered are listed, but only abnormal results are displayed) Labs Reviewed  PROTIME-INR  APTT  CBC  DIFFERENTIAL  COMPREHENSIVE METABOLIC PANEL  ETHANOL  I-STAT CHEM 8, ED  CBG MONITORING, ED    EKG EKG Interpretation Date/Time:  Tuesday September 03 2023 13:39:16 EST Ventricular Rate:  90 PR Interval:  130 QRS Duration:  144 QT Interval:  416 QTC Calculation: 508 R Axis:   31  Text Interpretation: Normal sinus rhythm Non-specific intra-ventricular conduction block Cannot rule out Anterior infarct , age undetermined T wave abnormality, consider lateral ischemia Abnormal ECG No previous ECGs  available Confirmed by Lenor Hollering [phone]) on 09/03/2023 10:45:14 PM  Radiology MR BRAIN WO CONTRAST Addendum Date: 09/03/2023 ADDENDUM REPORT: 09/03/2023 22:53 ADDENDUM: There was a synchronization error that caused the wrong report to be associated with this scan. The below is a corrected report that corresponds to the scan for Katelyn Trabert. Brain: There is a small acute infarct at the tail of the right caudate. Fewer than 5 scattered microhemorrhages in a nonspecific pattern. There is multifocal hyperintense T2-weighted signal within the white matter. Generalized volume loss. The midline structures are normal. Vascular: Normal flow voids. Skull and upper cervical spine: Normal calvarium and skull base. Visualized upper cervical spine and soft tissues are normal. Sinuses/Orbits:No paranasal sinus fluid levels or advanced mucosal thickening.  No mastoid or middle ear effusion. Normal orbits. IMPRESSION: 1. A software synchronization error caused an incorrect report to be associated with this examination. Please disregard the initial report. 2. There is a small acute infarct of the right caudate nucleus. No acute hemorrhage. 3. Chronic ischemic microangiopathy. These results were called by telephone at the time of interpretation on 09/03/2023 at 10:51 pm to provider [facility] , who verbally acknowledged these results. Electronically Signed   By: Franky Stanford M.D.   On: 09/03/2023 22:53   Result Date: 09/03/2023 CLINICAL DATA:  Acute neurologic deficit EXAM: MRI HEAD WITHOUT CONTRAST TECHNIQUE: Multiplanar, multiecho pulse sequences of the brain and surrounding structures were obtained without intravenous contrast. COMPARISON:  10/01/2021 FINDINGS: Brain: There is DWI hyperintensity throughout much of the left MCA territory, subjacent to a large area of encephalomalacia. No corresponding ADC deficit. Hemosiderin deposition over the left MCA encephalomalacia territory. Multifocal chronic  microhemorrhage in a nonspecific pattern. There is multifocal hyperintense T2-weighted signal within the white matter. Parenchymal volume and CSF spaces are normal. 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 The midline structures are normal. Vascular: Normal flow voids. Skull and upper cervical spine: Normal calvarium and skull base. Visualized upper cervical spine and soft tissues are normal. Sinuses/Orbits:No paranasal sinus fluid levels or advanced mucosal thickening. No mastoid or middle ear effusion. Normal orbits. IMPRESSION: 1. DWI hyperintensity throughout much of the left MCA territory, subjacent to a large area of encephalomalacia. This may indicate an area of subacute ischemia versus T2 shine through related to the old left MCA infarct. 2. 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 Electronically Signed: By: Franky Stanford M.D. On: 09/03/2023 22:18   CT HEAD WO CONTRAST Result Date: 09/03/2023 CLINICAL DATA:  Weakness, stroke suspected EXAM: CT HEAD WITHOUT CONTRAST TECHNIQUE: Contiguous axial images were obtained from the base of the skull through the vertex without intravenous contrast. RADIATION DOSE REDUCTION: This exam was performed according to the departmental dose-optimization program which includes automated exposure control, adjustment of the mA and/or kV according to patient size and/or use of iterative reconstruction technique. COMPARISON:  10/01/2021 CT head FINDINGS: Brain: No evidence of acute infarction, hemorrhage, mass, mass effect, or midline shift. No hydrocephalus or extra-axial fluid collection. Redemonstrated cerebellar atrophy. Redemonstrated encephalomalacia with laminar necrosis and calcifications in the bilateral occipital lobes. Periventricular white matter changes, likely the sequela of chronic small vessel ischemic disease. More focal hypodensity in the right posterior lentiform nucleus and corona radiata is new from the prior exam and favored  to represent a remote lacunar infarct. Vascular: No hyperdense vessel. Skull: Prior left parietooccipital craniotomy. Negative for fracture or focal lesion. Sinuses/Orbits: No acute finding. Other: The mastoid air cells are well aerated. IMPRESSION: 1. No acute intracranial process. 2. More focal hypodensity in the right posterior lentiform nucleus and corona radiata is new from the prior exam and favored to represent a remote lacunar infarct. If there is concern for an acute infarct, consider MRI for further evaluation. Electronically Signed   By: Donald Campion M.D.   On: 09/03/2023 17:12    Procedures Procedures    Medications Ordered in ED Medications  sodium chloride  flush (NS) 0.9 % injection 3 mL (has no administration in time range)    ED Course/ Medical Decision Making/ A&P                                 Medical Decision  Making Amount and/or Complexity of Data Reviewed Radiology: ordered.   Patient is a 78 year old who presents with left-sided weakness.  CT scan showed a questionable abnormality.  MRI was performed.  This shows a small acute infarct in the right caudate nucleus.  Labs reviewed and are nonconcerning.  EKG shows a sinus rhythm.  I discussed with the neurologist on-call, Dr. Vanessa who will evaluate the patient.  He recommends a medicine admission for further stroke workup.  Will consult the hospitalist team regarding this.  Final Clinical Impression(s) / ED Diagnoses Final diagnoses:  Cerebrovascular accident (CVA), unspecified mechanism [facility])    Rx / DC Orders ED Discharge Orders     None         Lenor Hollering, MD 09/03/23 2319

## 2023-09-03 NOTE — ED Provider Triage Note (Signed)
 Emergency Medicine Provider Triage Evaluation Note  Valerie [name] , a 78 y.o. female  was evaluated in triage.  Pt complains of 78 year old lady here today for 3 days of left arm weakness and numbness.  Patient has a history of adenocarcinoma of the left lung, had resection of metastatic cancer in the brain with radiation..  Review of Systems   Physical Exam  SpO2 98%  Gen:   Awake, no distress   Resp:  Normal effort  MSK:   Moves extremities without difficulty  Other:  Mild weakness in left hand, arm, leg compared to right.  Medical Decision Making  Medically screening exam initiated at 2:52 PM.  Appropriate orders placed.  Valerie [name] was informed that the remainder of the evaluation will be completed by another provider, this initial triage assessment does not replace that evaluation, and the importance of remaining in the ED until their evaluation is complete.  Patient with history of prior brain cancer here today with left arm and leg weakness of 3 days duration.  Ordered noncontrasted head CT on the patient, basic labs.  Patient outside of window of intervention.  Concern for recurrent metastatic disease.   Valerie Pac T, DO 09/03/23 1453

## 2023-09-03 NOTE — ED Triage Notes (Signed)
 Pt arrives via EMS from [facility]. Pt reports weakness in left arm and left leg x 3 days. Pt denies any other symptoms. She is a little confused on the current date.

## 2023-09-04 ENCOUNTER — Inpatient Hospital Stay (HOSPITAL_COMMUNITY): Payer: Medicare PPO

## 2023-09-04 DIAGNOSIS — Z85841 Personal history of malignant neoplasm of brain: Secondary | ICD-10-CM | POA: Diagnosis not present

## 2023-09-04 DIAGNOSIS — Z66 Do not resuscitate: Secondary | ICD-10-CM | POA: Diagnosis present

## 2023-09-04 DIAGNOSIS — Z79899 Other long term (current) drug therapy: Secondary | ICD-10-CM | POA: Diagnosis not present

## 2023-09-04 DIAGNOSIS — M810 Age-related osteoporosis without current pathological fracture: Secondary | ICD-10-CM | POA: Diagnosis present

## 2023-09-04 DIAGNOSIS — E890 Postprocedural hypothyroidism: Secondary | ICD-10-CM | POA: Diagnosis present

## 2023-09-04 DIAGNOSIS — Z7901 Long term (current) use of anticoagulants: Secondary | ICD-10-CM | POA: Diagnosis not present

## 2023-09-04 DIAGNOSIS — G8194 Hemiplegia, unspecified affecting left nondominant side: Secondary | ICD-10-CM | POA: Diagnosis present

## 2023-09-04 DIAGNOSIS — Z91018 Allergy to other foods: Secondary | ICD-10-CM | POA: Diagnosis not present

## 2023-09-04 DIAGNOSIS — Z6824 Body mass index (BMI) 24.0-24.9, adult: Secondary | ICD-10-CM | POA: Diagnosis not present

## 2023-09-04 DIAGNOSIS — I639 Cerebral infarction, unspecified: Secondary | ICD-10-CM | POA: Diagnosis present

## 2023-09-04 DIAGNOSIS — Z85850 Personal history of malignant neoplasm of thyroid: Secondary | ICD-10-CM | POA: Diagnosis not present

## 2023-09-04 DIAGNOSIS — E785 Hyperlipidemia, unspecified: Secondary | ICD-10-CM | POA: Diagnosis present

## 2023-09-04 DIAGNOSIS — F0150 Vascular dementia without behavioral disturbance: Secondary | ICD-10-CM

## 2023-09-04 DIAGNOSIS — M7989 Other specified soft tissue disorders: Secondary | ICD-10-CM | POA: Diagnosis not present

## 2023-09-04 DIAGNOSIS — Z85118 Personal history of other malignant neoplasm of bronchus and lung: Secondary | ICD-10-CM | POA: Diagnosis not present

## 2023-09-04 DIAGNOSIS — E440 Moderate protein-calorie malnutrition: Secondary | ICD-10-CM | POA: Diagnosis present

## 2023-09-04 DIAGNOSIS — I480 Paroxysmal atrial fibrillation: Secondary | ICD-10-CM | POA: Diagnosis present

## 2023-09-04 DIAGNOSIS — I5022 Chronic systolic (congestive) heart failure: Secondary | ICD-10-CM | POA: Diagnosis present

## 2023-09-04 DIAGNOSIS — I110 Hypertensive heart disease with heart failure: Secondary | ICD-10-CM | POA: Diagnosis present

## 2023-09-04 DIAGNOSIS — R29712 NIHSS score 12: Secondary | ICD-10-CM | POA: Diagnosis present

## 2023-09-04 DIAGNOSIS — D6859 Other primary thrombophilia: Secondary | ICD-10-CM | POA: Diagnosis present

## 2023-09-04 DIAGNOSIS — Z993 Dependence on wheelchair: Secondary | ICD-10-CM | POA: Diagnosis not present

## 2023-09-04 DIAGNOSIS — I6389 Other cerebral infarction: Secondary | ICD-10-CM

## 2023-09-04 DIAGNOSIS — Z923 Personal history of irradiation: Secondary | ICD-10-CM | POA: Diagnosis not present

## 2023-09-04 DIAGNOSIS — Z9221 Personal history of antineoplastic chemotherapy: Secondary | ICD-10-CM | POA: Diagnosis not present

## 2023-09-04 DIAGNOSIS — F0390 Unspecified dementia without behavioral disturbance: Secondary | ICD-10-CM | POA: Diagnosis present

## 2023-09-04 DIAGNOSIS — I6340 Cerebral infarction due to embolism of unspecified cerebral artery: Secondary | ICD-10-CM | POA: Diagnosis present

## 2023-09-04 DIAGNOSIS — Z882 Allergy status to sulfonamides status: Secondary | ICD-10-CM | POA: Diagnosis not present

## 2023-09-04 LAB — LIPID PANEL
Cholesterol: 252 mg/dL — ABNORMAL HIGH (ref 0–200)
HDL: 63 mg/dL (ref >40)
LDL Cholesterol: 168 mg/dL — ABNORMAL HIGH (ref 0–99)
Total CHOL/HDL Ratio: 4 {ratio}
Triglycerides: 103 mg/dL (ref <150)
VLDL: 21 mg/dL (ref 0–40)

## 2023-09-04 LAB — BASIC METABOLIC PANEL
Anion gap: 11 (ref 5–15)
BUN: 18 mg/dL (ref 8–23)
CO2: 22 mmol/L (ref 22–32)
Calcium: 9.7 mg/dL (ref 8.9–10.3)
Chloride: 106 mmol/L (ref 98–111)
Creatinine, Ser: 0.94 mg/dL (ref 0.44–1.00)
GFR, Estimated: >60 mL/min (ref >60)
Glucose, Bld: 117 mg/dL — ABNORMAL HIGH (ref 70–99)
Potassium: 4.5 mmol/L (ref 3.5–5.1)
Sodium: 139 mmol/L (ref 135–145)

## 2023-09-04 LAB — CBC WITH DIFFERENTIAL/PLATELET
Abs Immature Granulocytes: 0.05 10*3/uL (ref 0.00–0.07)
Basophils Absolute: 0 10*3/uL (ref 0.0–0.1)
Basophils Relative: 0 %
Eosinophils Absolute: 0.1 10*3/uL (ref 0.0–0.5)
Eosinophils Relative: 0 %
HCT: 44.1 % (ref 36.0–46.0)
Hemoglobin: 13.9 g/dL (ref 12.0–15.0)
Immature Granulocytes: 0 %
Lymphocytes Relative: 8 %
Lymphs Abs: 1.1 10*3/uL (ref 0.7–4.0)
MCH: 29.3 pg (ref 26.0–34.0)
MCHC: 31.5 g/dL (ref 30.0–36.0)
MCV: 93 fL (ref 80.0–100.0)
Monocytes Absolute: 0.7 10*3/uL (ref 0.1–1.0)
Monocytes Relative: 5 %
Neutro Abs: 11.4 10*3/uL — ABNORMAL HIGH (ref 1.7–7.7)
Neutrophils Relative %: 87 %
Platelets: 302 10*3/uL (ref 150–400)
RBC: 4.74 MIL/uL (ref 3.87–5.11)
RDW: 13.8 % (ref 11.5–15.5)
WBC: 13.4 10*3/uL — ABNORMAL HIGH (ref 4.0–10.5)
nRBC: 0 % (ref 0.0–0.2)

## 2023-09-04 LAB — ECHOCARDIOGRAM COMPLETE
Area-P 1/2: 3.91 cm2
S' Lateral: 3.8 cm
Single Plane A4C EF: 28.6 %

## 2023-09-04 LAB — HEMOGLOBIN A1C
Hgb A1c MFr Bld: 5 % (ref 4.8–5.6)
Mean Plasma Glucose: 96.8 mg/dL

## 2023-09-04 LAB — MAGNESIUM: Magnesium: 2.3 mg/dL (ref 1.7–2.4)

## 2023-09-04 MED ORDER — ASPIRIN 325 MG PO TABS
325.0000 mg | ORAL_TABLET | Freq: Once | ORAL | Status: AC
  Administered 2023-09-04: 325 mg via ORAL
  Filled 2023-09-04: qty 1

## 2023-09-04 MED ORDER — IOHEXOL 350 MG/ML SOLN
75.0000 mL | Freq: Once | INTRAVENOUS | Status: AC | PRN
  Administered 2023-09-04: 75 mL via INTRAVENOUS

## 2023-09-04 MED ORDER — METOPROLOL TARTRATE 5 MG/5ML IV SOLN: 5.0000 mg | INTRAVENOUS | Status: DC | PRN

## 2023-09-04 MED ORDER — ACETAMINOPHEN 325 MG PO TABS
650.0000 mg | ORAL_TABLET | Freq: Four times a day (QID) | ORAL | Status: DC | PRN
  Administered 2023-09-07: 650 mg via ORAL
  Filled 2023-09-04: qty 2

## 2023-09-04 MED ORDER — ACETAMINOPHEN 650 MG RE SUPP: 650.0000 mg | Freq: Four times a day (QID) | RECTAL | Status: DC | PRN

## 2023-09-04 MED ORDER — CLOPIDOGREL BISULFATE 75 MG PO TABS
75.0000 mg | ORAL_TABLET | Freq: Every day | ORAL | Status: DC
  Administered 2023-09-04 – 2023-09-05 (×2): 75 mg via ORAL
  Filled 2023-09-04 (×2): qty 1

## 2023-09-04 MED ORDER — ASPIRIN 81 MG PO TBEC
81.0000 mg | DELAYED_RELEASE_TABLET | Freq: Every day | ORAL | Status: DC
  Administered 2023-09-04 – 2023-09-05 (×2): 81 mg via ORAL
  Filled 2023-09-04 (×2): qty 1

## 2023-09-04 MED ORDER — STROKE: EARLY STAGES OF RECOVERY BOOK
Freq: Once | Status: AC
  Filled 2023-09-04: qty 1

## 2023-09-04 MED ORDER — HEPARIN SODIUM (PORCINE) 5000 UNIT/ML IJ SOLN
5000.0000 [IU] | Freq: Three times a day (TID) | INTRAMUSCULAR | Status: DC
  Administered 2023-09-04 – 2023-09-05 (×4): 5000 [IU] via SUBCUTANEOUS
  Filled 2023-09-04 (×4): qty 1

## 2023-09-04 MED ORDER — ROSUVASTATIN CALCIUM 20 MG PO TABS
20.0000 mg | ORAL_TABLET | Freq: Every day | ORAL | Status: DC
  Administered 2023-09-04 – 2023-09-07 (×4): 20 mg via ORAL
  Filled 2023-09-04 (×4): qty 1

## 2023-09-04 MED ORDER — SENNOSIDES-DOCUSATE SODIUM 8.6-50 MG PO TABS: 1.0000 | ORAL_TABLET | Freq: Every evening | ORAL | Status: DC | PRN

## 2023-09-04 NOTE — Plan of Care (Signed)
   Problem: Ischemic Stroke/TIA Tissue Perfusion: Goal: Complications of ischemic stroke/TIA will be minimized Outcome: Progressing

## 2023-09-04 NOTE — Consult Note (Signed)
 NEUROLOGY CONSULT NOTE   Date of service: September 04, 2023 Patient Name: Valerie [name] MRN:  [id number] DOB:  11/06/1945 Chief Complaint: "L sided weakness" Requesting Provider: Corrinne Din, MD  History of Present Illness  Valerie [name] is a 78 y.o. female hx of lung cancer, brain mets back in 2007 and in remission, baseline wheelchair dependent and in a SNF, hx of osteoporisis who presents with 2-3 day hx of L leg and L arm weakness.  Symptoms were noted by daughter today and requested that she be taken to ED for evaluation. Patient reports that she has been feeling weak on the left for the last couple of days. She did not mention this to the staff and since she is wheelchair dependent, staff at her facility failed to realize her weakness.  In the ED, she had MRI Brain which demonstrated R caudate stroke.  No prior hx of stroke, her sister had a stroke. No hx of DM2, HTN, HLD. She is not a current smoker.  LKW: 09/01/23 Modified rankin score: 4-Needs assistance to walk and tend to bodily needs IV Thrombolysis: not offered, outside window EVT: not offered, low suspicion for LVO   NIHSS components Score: Comment  1a Level of Conscious 0[x]  1[]  2[]  3[]      1b LOC Questions 0[]  1[x]  2[]       1c LOC Commands 0[x]  1[]  2[]       2 Best Gaze 0[x]  1[]  2[]       3 Visual 0[x]  1[]  2[]  3[]      4 Facial Palsy 0[x]  1[]  2[]  3[]      5a Motor Arm - left 0[]  1[]  2[x]  3[]  4[]  UN[]    5b Motor Arm - Right 0[x]  1[]  2[]  3[]  4[]  UN[]    6a Motor Leg - Left 0[]  1[]  2[]  3[x]  4[]  UN[]  Baseline wheelchair dependent  6b Motor Leg - Right 0[]  1[]  2[]  3[x]  4[]  UN[]    7 Limb Ataxia 0[x]  1[]  2[]  3[]  UN[]     8 Sensory 0[x]  1[]  2[]  UN[]      9 Best Language 0[x]  1[]  2[]  3[]      10 Dysarthria 0[x]  1[]  2[]  UN[]      11 Extinct. and Inattention 0[x]  1[]  2[]       TOTAL: 9      ROS  Comprehensive ROS performed and pertinent positives documented in HPI   Past History   Past Medical History:   Diagnosis Date   Abnormal Pap smear 2006   Anal fistula    Ankle fracture    Anxiety    Arthritis    Atrophic vaginitis 2008   Cataract    Dementia (HCC) 2009   Dyspareunia 2008   H/O osteoporosis    H/O varicella    H/O vitamin D  deficiency    Headache 07/24/2016   Heart murmur    History of measles, mumps, or rubella    History of radiation therapy 07/28/13- 08/10/13   right lung metastasis 5000 cGy 10 sessions   Hypertension    Lung cancer (HCC) dx'd 2002   Lung cancer (HCC)    Lung cancer (HCC)    Lung metastasis    PET scan 05/05/13, RUL lung nodule   Metastasis to brain [facility]) dx'd 2008   Metastasis to lymph nodes (HCC) dx'd 09/2011   Nodule of right lung CT- 06/03/12   RIGHT UPPER LOBE   Nodule of right lung 06/03/12   Upper Lobe   On antineoplastic chemotherapy    TARCEVA    Osteoporosis 2010  Primary cancer of right upper lobe of lung (HCC) 04/22/2009   Qualifier: Diagnosis of  By: Celestia Colander CMA (AAMA), Christine Cozier     Shortness of breath    hx lung ca    Status post chemotherapy 2003   CARBOPLATIN/PACLITAXEL /STATUS POST CLINICAL TRAIL OF CELEBREX AND IRESSA AT [facility] FOR 1 YEAR   Status post radiation therapy 2003   LEFT LUNG   Status post radiation therapy 11/07/2005   WHOLE BRAIN: DR Austine Lefort [facility]   Status post radiation therapy 06/02/2008   GAMMA KNIFE OF RESECTED CAVITAY   Thyroid  adenoma    ?   Thyroid  cancer (HCC) 10/18/11 bx   adenoid nodules    Yeast infection     Past Surgical History:  Procedure Laterality Date   BRAIN SURGERY     CORONARY ULTRASOUND/IVUS N/A 02/16/2020   Procedure: Intravascular Ultrasound/IVUS;  Surgeon: Arleen Lacer, MD;  Location: [facility] INVASIVE CV LAB;  Service: Cardiovascular;  Laterality: N/A;   LEFT LOWER LOBECTOMY  05/2001   LEFT OCCIPITAL CRANIOTOMY  09/21/2005   tumor   LUNG LOBECTOMY     NECK SURGERY     ORIF ANKLE FRACTURE Left 10/02/2014   Procedure: OPEN REDUCTION INTERNAL FIXATION (ORIF) ANKLE FRACTURE;  Surgeon: Boston Byers, MD;  Location: [facility] ORS;  Service: Orthopedics;  Laterality: Left;   ORIF FEMUR FRACTURE Right 06/11/2019   Procedure: OPEN REDUCTION INTERNAL FIXATION (ORIF) DISTAL FEMUR FRACTURE;  Surgeon: Saundra Curl, MD;  Location: [facility] OR;  Service: Orthopedics;  Laterality: Right;   ORIF TIBIA PLATEAU Right 06/11/2019   Procedure: Open Reduction Internal Fixation (Orif) Tibial Plateau;  Surgeon: Saundra Curl, MD;  Location: [facility] OR;  Service: Orthopedics;  Laterality: Right;   RADICAL NECK DISSECTION  10/18/2011   Procedure: RADICAL NECK DISSECTION;  Surgeon: Janita Mellow, MD;  Location: [facility] OR;  Service: ENT;  Laterality: Right;  RIGHT MODIFIED NECK DISSECTION /POSSIBLE RIGHT THYROIDECTOM   RIGHT/LEFT HEART CATH AND CORONARY ANGIOGRAPHY N/A 02/16/2020   Procedure: RIGHT/LEFT HEART CATH AND CORONARY ANGIOGRAPHY;  Surgeon: Arleen Lacer, MD;  Location: [facility] INVASIVE CV LAB;  Service: Cardiovascular;  Laterality: N/A;   TEE WITHOUT CARDIOVERSION N/A 03/28/2020   Procedure: TRANSESOPHAGEAL ECHOCARDIOGRAM (TEE);  Surgeon: Sheryle Donning, MD;  Location: [facility] ENDOSCOPY;  Service: Cardiovascular;  Laterality: N/A;   THYROIDECTOMY  10/18/2011   Procedure: THYROIDECTOMY WITH RADICAL NECK DISSECTION;  Surgeon: Janita Mellow, MD;  Location: [facility] OR;  Service: ENT;  Laterality: Right;    Family History: Family History  Problem Relation Age of Onset   Gait disorder Mother    Cancer Mother        Meningioma   Cancer Father        Pancreatic   Heart failure Sister    Cancer Maternal Aunt        menigeoma    Social History  reports that she has never smoked. She has never used smokeless tobacco. She reports that she does not drink alcohol and does not use drugs.  Allergies  Allergen Reactions   Broccoli [Brassica Oleracea] Other (See Comments)   Fruit Extracts Other (See Comments)    unknown   Sulfa  Antibiotics Other (See Comments)    She cant remember  what happens    Medications   Current  Facility-Administered Medications:    [START ON 09/05/2023]  stroke: early stages of recovery book, , Does not apply, Once, Crosley, Debby, MD   acetaminophen  (TYLENOL ) tablet 650 mg, 650 mg, Oral, Q6H  PRN **OR** acetaminophen  (TYLENOL ) suppository 650 mg, 650 mg, Rectal, Q6H PRN, Crosley, Debby, MD   aspirin  tablet 325 mg, 325 mg, Oral, Once **FOLLOWED BY** aspirin  EC tablet 81 mg, 81 mg, Oral, Daily, Crosley, Debby, MD   heparin  injection 5,000 Units, 5,000 Units, Subcutaneous, Q8H, Crosley, Debby, MD   metoprolol  tartrate (LOPRESSOR ) injection 5 mg, 5 mg, Intravenous, Q4H PRN, Crosley, Debby, MD   senna-docusate (Senokot-S) tablet 1 tablet, 1 tablet, Oral, QHS PRN, Crosley, Debby, MD   sodium chloride  flush (NS) 0.9 % injection 3 mL, 3 mL, Intravenous, Once, Rosealee Concha, MD  Current Outpatient Medications:    ACETAMINOPHEN  EXTRA STRENGTH 500 MG tablet, Take 1,000 mg by mouth every 8 (eight) hours as needed for moderate pain., Disp: , Rfl:    bisacodyl  (DULCOLAX) 10 MG suppository, Place 1 suppository (10 mg total) rectally as needed for moderate constipation., Disp: 12 suppository, Rfl: 0   donepezil  (ARICEPT ) 10 MG tablet, TAKE 1 TABLET BY MOUTH ONCE DAILY IN THE EVENING (Patient taking differently: Take 10 mg by mouth every evening.), Disp: 90 tablet, Rfl: 0   feeding supplement (ENSURE ENLIVE / ENSURE PLUS) LIQD, Take 237 mLs by mouth 2 (two) times daily between meals., Disp: 237 mL, Rfl: 12   hydrocortisone  cream 1 %, Apply 1 Application topically 3 (three) times daily., Disp: , Rfl:    Hydrocortisone , Perianal, 1 % CREA, Apply 1 g topically 2 (two) times daily., Disp: , Rfl:    HYDROCORTISONE , TOPICAL, 2 % LOTN, Apply topically 3 (three) times daily., Disp: , Rfl:    ipratropium-albuterol  (DUONEB) 0.5-2.5 (3) MG/3ML SOLN, Take 3 mLs by nebulization every 6 (six) hours as needed., Disp: , Rfl:    Magnesium  200 MG TABS, Take 200 mg by mouth daily., Disp: , Rfl:    mirtazapine  (REMERON ) 7.5  MG tablet, Take 7.5 mg by mouth at bedtime., Disp: , Rfl:    Multiple Vitamin (MULTI-VITAMINS) TABS, Take 1 tablet by mouth every evening. , Disp: , Rfl:    NON FORMULARY, Apply 1 application topically every 4 (four) hours as needed (for rash to buttocks)., Disp: , Rfl:    nystatin  powder, Apply 1 Application topically 3 (three) times daily., Disp: , Rfl:    psyllium (METAMUCIL) 58.6 % packet, Take 1 packet by mouth daily., Disp: , Rfl:    saccharomyces boulardii (FLORASTOR) 250 MG capsule, Take 1 capsule (250 mg total) by mouth 2 (two) times daily., Disp: 60 capsule, Rfl: 0   Skin Protectants, Misc. (EUCERIN) cream, Apply 1 Application topically 2 (two) times daily., Disp: , Rfl:    vitamin B-12 (CYANOCOBALAMIN ) 1000 MCG tablet, Take 1,000 mcg by mouth every evening., Disp: , Rfl:   Vitals   Vitals:   09/03/23 1325 09/03/23 1330 09/03/23 1912 09/04/23 0016  BP:  122/79 129/65 122/82  Pulse:  83 88 (!) 113  Resp:  19 17 19   Temp:   98.7 F (37.1 C) 98.9 F (37.2 C)  TempSrc:    Oral  SpO2: 98% 97% 96% 97%    There is no height or weight on file to calculate BMI.  Physical Exam   General: Laying comfortably in bed; in no acute distress.  HENT: Normal oropharynx and mucosa. Normal external appearance of ears and nose.  Neck: Supple, no pain or tenderness  CV: No JVD. No peripheral edema.  Pulmonary: Symmetric Chest rise. Normal respiratory effort.  Abdomen: Soft to touch, non-tender.  Ext: No cyanosis, edema, or deformity  Skin: No rash.  Normal palpation of skin.   Musculoskeletal: Normal digits and nails by inspection. No clubbing.   Neurologic Examination  Mental status/Cognition: Alert, oriented to self, place, but not to month and year, good attention.  Speech/language: Fluent, comprehension intact, object naming intact, repetition intact.  Cranial nerves:   CN II Pupils equal and reactive to light, no VF deficits    CN III,IV,VI EOM intact, no gaze preference or deviation,  no nystagmus    CN V normal sensation in V1, V2, and V3 segments bilaterally    CN VII no asymmetry, no nasolabial fold flattening    CN VIII normal hearing to speech    CN IX & X normal palatal elevation, no uvular deviation    CN XI 5/5 head turn and 5/5 shoulder shrug bilaterally    CN XII midline tongue protrusion    Motor:  Muscle bulk: poor, tone normal. Mvmt Root Nerve  Muscle Right Left Comments  SA C5/6 Ax Deltoid 5 3   EF C5/6 Mc Biceps 4+ 4   EE C6/7/8 Rad Triceps 4+ 4   WF C6/7 Med FCR     WE C7/8 PIN ECU     F Ab C8/T1 U ADM/FDI 5 4   HF L1/2/3 Fem Illopsoas 2 2   KE L2/3/4 Fem Quad 3 1   DF L4/5 D Peron Tib Ant 4 1   PF S1/2 Tibial Grc/Sol 4 1    Sensation:  Light touch Intact throughout   Pin prick    Temperature    Vibration   Proprioception    Coordination/Complex Motor:  - Finger to Nose intact BL - Heel to shin unable to do due to baseline chronic leg weakness. - Rapid alternating movement are slowed on the right, unable to do with left hand - Gait: deferred, she is wheelchair dependent at baseline.  Labs/Imaging/Neurodiagnostic studies   CBC:  Recent Labs  Lab 09/28/2023 1506 09/28/2023 1512  WBC 8.0  --   NEUTROABS 5.3  --   HGB 13.1 13.9  HCT 42.0 41.0  MCV 95.7  --   PLT 268  --    Basic Metabolic Panel:  Lab Results  Component Value Date   NA 137 28-Sep-2023   K 4.6 09/28/2023   CO2 23 Sep 28, 2023   GLUCOSE 92 09/28/2023   BUN 23 28-Sep-2023   CREATININE 0.90 09-28-2023   CALCIUM  9.7 September 28, 2023   GFRNONAA >60 September 28, 2023   GFRAA >60 04/26/2020   Lipid Panel:  Lab Results  Component Value Date   LDLCALC 97 04/20/2020   HgbA1c:  Lab Results  Component Value Date   HGBA1C 5.1 10/16/2018   Urine Drug Screen:     Component Value Date/Time   LABOPIA NONE DETECTED 10/01/2021 1808   COCAINSCRNUR NONE DETECTED 10/01/2021 1808   LABBENZ NONE DETECTED 10/01/2021 1808   AMPHETMU NONE DETECTED 10/01/2021 1808   THCU NONE DETECTED  10/01/2021 1808   LABBARB NONE DETECTED 10/01/2021 1808    Alcohol Level     Component Value Date/Time   ETH <10 September 28, 2023 1506   INR  Lab Results  Component Value Date   INR 1.1 September 28, 2023   APTT  Lab Results  Component Value Date   APTT 28 09/28/23   AED levels: No results found for: "PHENYTOIN", "ZONISAMIDE", "LAMOTRIGINE", "LEVETIRACETA"  CT Head without contrast(Personally reviewed): CTH was negative for a large hypodensity concerning for a large territory infarct or hyperdensity concerning for an ICH  CT angio Head and Neck with contrast(Personally  reviewed): pending  MRI Brain(Personally reviewed): R caudate stroke  ASSESSMENT   Valerie [name] is a 78 y.o. female hx of lung cancer, brain mets back in 2007 and in remission, baseline wheelchair dependent and in a SNF, hx of osteoporisis who presents with 2-3 day hx of L leg and L arm weakness. Found to have R caudate stroke on MRI Brain  RECOMMENDATIONS  - Frequent Neuro checks per stroke unit protocol - Recommend Vascular imaging with CTA head and neck - Recommend obtaining TTE  - Recommend obtaining Lipid panel with LDL - Please start statin if LDL > 70 - Recommend HbA1c to evaluate for diabetes and how well it is controlled. - Antithrombotic - Aspirin  81mg  daily along with plavix  75mg  daily x 21 days, followed by Aspirin  81mg  daily alone. - Recommend DVT ppx - SBP goal - aim for gradual normotension. - Recommend Telemetry monitoring for arrythmia - Recommend bedside swallow screen prior to PO intake. - Stroke education booklet - Recommend PT/OT/SLP consult - Recommend Urine Tox screen.   ______________________________________________________________________    Signed, Shresta Risden, MD [facility]

## 2023-09-04 NOTE — ED Notes (Signed)
 Gerhardt Knudsen, MD at bedside evaluating patient.

## 2023-09-04 NOTE — Evaluation (Signed)
 Occupational Therapy Evaluation Patient Details Name: Valerie [name] MRN: [id number] DOB: 02-23-1946 Today's Date: 09/04/2023   History of Present Illness 78 y.o. female presents to [facility] hospital on 09/02/2022 with L weakness. MRI with subacute R caudate infarct. PMH includes metastatic lung cancer, dementia, anxiety, OA, prior CVA.   Clinical Impression   Valerie [name] was evaluated s/p the above admission list. She resides at [facility] at baseline, she is dependently lifted OOB bed daily, can mobilize herself at [facility] level and is dependent for most ADLs. Upon evaluation the pt was limited by new L hemibody weakness. Overall she will continue to need total A for bed mobility and OOB transfers. Due to the deficits listed below the pt also needs total A for LB ADLS and max A for UB ADLs. Pt will benefit from continued acute OT services to address new LUE weakness and safe positioning to facilitate d/c back to skilled inpatient follow up therapy, <3 hours/day.        If plan is discharge home, recommend the following: A lot of help with walking and/or transfers;Two people to help with walking and/or transfers;A lot of help with bathing/dressing/bathroom;Two people to help with bathing/dressing/bathroom;Assistance with cooking/housework;Assistance with feeding;Direct supervision/assist for financial management;Direct supervision/assist for medications management;Assist for transportation;Help with stairs or ramp for entrance;Supervision due to cognitive status    Functional Status Assessment  Patient has had a recent decline in their functional status and demonstrates the ability to make significant improvements in function in a reasonable and predictable amount of time.  Equipment Recommendations  None recommended by OT       Precautions / Restrictions Precautions Precautions: Fall Restrictions Weight Bearing Restrictions Per Provider Order: No      Mobility Bed Mobility Overal bed mobility:  Needs Assistance Bed Mobility: Rolling Rolling: Total assist              Transfers                   General transfer comment: hoyer at baseline      Balance Overall balance assessment: Needs assistance Sitting-balance support: Bilateral upper extremity supported, Feet unsupported Sitting balance-Leahy Scale: Zero                                     ADL either performed or assessed with clinical judgement   ADL Overall ADL's : Needs assistance/impaired Eating/Feeding: Set up;Bed level   Grooming: Set up;Bed level   Upper Body Bathing: Maximal assistance   Lower Body Bathing: Total assistance   Upper Body Dressing : Maximal assistance   Lower Body Dressing: Total assistance   Toilet Transfer: Total assistance   Toileting- Clothing Manipulation and Hygiene: Total assistance       Functional mobility during ADLs: Total assistance General ADL Comments: likely near baseline for ADLs     Vision Baseline Vision/History: 0 No visual deficits Vision Assessment?: No apparent visual deficits     Perception Perception: Not tested       Praxis Praxis: Not tested       Pertinent Vitals/Pain Pain Assessment Pain Assessment: No/denies pain     Extremity/Trunk Assessment Upper Extremity Assessment Upper Extremity Assessment: RUE deficits/detail;LUE deficits/detail;Right hand dominant RUE Deficits / Details: ROM WFL, globally weak but likely baseline LUE Deficits / Details: limited overhead AROM, full PROM. unable to hold arm up against gravity. Nearly full AROM of digits, 3/5 MMT gross grasp. denies  sensation changes LUE Sensation: WNL LUE Coordination: decreased fine motor;decreased gross motor   Lower Extremity Assessment Lower Extremity Assessment: Defer to PT evaluation RLE Deficits / Details: ROM WFL, pt grossly 4-/5 LLE Deficits / Details: ROM WFL, pt with 2/5 ankle DF/PF, 3-/5 otherwise   Cervical / Trunk Assessment Cervical /  Trunk Assessment: Kyphotic   Communication Communication Communication: No apparent difficulties Cueing Techniques: Verbal cues   Cognition Arousal: Alert Behavior During Therapy: WFL for tasks assessed/performed Overall Cognitive Status: History of cognitive impairments - at baseline                                 General Comments: chart notes a history of dementia, pt is alert and oriented to person, place, situation. Pt follows commands well, appropriate for session.     General Comments  VSS, dtr present            Home Living Family/patient expects to be discharged to:: Skilled nursing facility         Additional Comments: [facility] skilled care      Prior Functioning/Environment Prior Level of Function : Needs assist             Mobility Comments: hoyer OOB to wc or recliner daily, personal aide comeds 3x/wk to assist pt with exercises in sitting ADLs Comments: dependent for most ADLs, assists with use of RUE as much as possible.        OT Problem List: Decreased activity tolerance;Impaired balance (sitting and/or standing);Impaired UE functional use      OT Treatment/Interventions: Self-care/ADL training;Therapeutic exercise;Neuromuscular education;DME and/or AE instruction;Therapeutic activities;Balance training;Patient/family education    OT Goals(Current goals can be found in the care plan section) Acute Rehab OT Goals Patient Stated Goal: back to [facility] OT Goal Formulation: With patient Time For Goal Achievement: 09/18/23 Potential to Achieve Goals: Good ADL Goals Pt Will Perform Grooming: with set-up;sitting Pt Will Perform Upper Body Dressing: with min assist;sitting Additional ADL Goal #1: Pt will tolerate dependent hoyer OOB as a precursor for OOB ADLs  OT Frequency: Min 1X/week       AM-PAC OT "6 Clicks" Daily Activity     Outcome Measure Help from another person eating meals?: A Little Help from another person  taking care of personal grooming?: A Little Help from another person toileting, which includes using toliet, bedpan, or urinal?: Total Help from another person bathing (including washing, rinsing, drying)?: A Lot Help from another person to put on and taking off regular upper body clothing?: A Lot Help from another person to put on and taking off regular lower body clothing?: Total 6 Click Score: 12   End of Session Nurse Communication: Mobility status  Activity Tolerance: Patient tolerated treatment well Patient left: in bed;with call bell/phone within reach  OT Visit Diagnosis: Other abnormalities of gait and mobility (R26.89);Muscle weakness (generalized) (M62.81)                Time: 4098-1191 OT Time Calculation (min): 18 min Charges:  OT General Charges $OT Visit: 1 Visit OT Evaluation $OT Eval Moderate Complexity: 1 Mod  Veryl Gottron, OTR/L Acute Rehabilitation Services Office [phone] Secure Chat Communication Preferred   Starla Easterly 09/04/2023, 1:18 PM

## 2023-09-04 NOTE — H&P (Addendum)
 PCP:   Pcp, No   Chief Complaint:  Left-sided weakness  HPI: This is a 78 year old female with past medical history significant for metastatic lung cancer, dementia.  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  Patient wheelchair dependent, currently in a SNF. Patient's daughters noted patient has been weak on the left side for the last 3 days.  She brought her to the ER.  The patient is unable to provide any additional history.  In the ER patient hemodynamically stable.  CT head normal.  MRI shows small acute ischemic stroke.  Review of Systems:  Unable to obtain  Past Medical History: Past Medical History:  Diagnosis Date   Abnormal Pap smear 2006   Anal fistula    Ankle fracture    Anxiety    Arthritis    Atrophic vaginitis 2008   Cataract    Dementia (HCC) 2009   Dyspareunia 2008   H/O osteoporosis    H/O varicella    H/O vitamin D  deficiency    Headache 07/24/2016   Heart murmur    History of measles, mumps, or rubella    History of radiation therapy 07/28/13- 08/10/13   right lung metastasis 5000 cGy 10 sessions   Hypertension    Lung cancer (HCC) dx'd 2002   Lung cancer (HCC)    Lung cancer (HCC)    Lung metastasis    PET scan 05/05/13, RUL lung nodule   Metastasis to brain [facility]) dx'd 2008   Metastasis to lymph nodes (HCC) dx'd 09/2011   Nodule of right lung CT- 06/03/12   RIGHT UPPER LOBE   Nodule of right lung 06/03/12   Upper Lobe   On antineoplastic chemotherapy    TARCEVA    Osteoporosis 2010   Primary cancer of right upper lobe of lung (HCC) 04/22/2009   Qualifier: Diagnosis of  By: Celestia Colander CMA (AAMA), Leisha     Shortness of breath    hx lung ca    Status post chemotherapy 2003   CARBOPLATIN/PACLITAXEL /STATUS POST CLINICAL TRAIL OF CELEBREX AND IRESSA AT [facility] FOR 1 YEAR   Status post radiation therapy 2003   LEFT LUNG   Status post radiation therapy 11/07/2005   WHOLE  BRAIN: DR Austine Lefort [facility]   Status post radiation therapy 06/02/2008   GAMMA KNIFE OF RESECTED CAVITAY   Thyroid  adenoma    ?   Thyroid  cancer (HCC) 10/18/11 bx   adenoid nodules    Yeast infection    Past Surgical History:  Procedure Laterality Date   BRAIN SURGERY     CORONARY ULTRASOUND/IVUS N/A 02/16/2020   Procedure: Intravascular Ultrasound/IVUS;  Surgeon: Arleen Lacer, MD;  Location: [facility] INVASIVE CV LAB;  Service: Cardiovascular;  Laterality: N/A;   LEFT LOWER LOBECTOMY  05/2001   LEFT OCCIPITAL CRANIOTOMY  09/21/2005   tumor   LUNG LOBECTOMY     NECK SURGERY     ORIF ANKLE FRACTURE Left 10/02/2014   Procedure: OPEN REDUCTION INTERNAL FIXATION (ORIF) ANKLE FRACTURE;  Surgeon: Boston Byers, MD;  Location: [facility] ORS;  Service: Orthopedics;  Laterality: Left;   ORIF FEMUR FRACTURE Right 06/11/2019   Procedure: OPEN REDUCTION INTERNAL FIXATION (ORIF) DISTAL FEMUR FRACTURE;  Surgeon: Saundra Curl, MD;  Location: [facility] OR;  Service: Orthopedics;  Laterality: Right;   ORIF TIBIA PLATEAU Right 06/11/2019   Procedure: Open Reduction Internal Fixation (Orif) Tibial Plateau;  Surgeon: Saundra Curl, MD;  Location: [facility] OR;  Service: Orthopedics;  Laterality: Right;   RADICAL NECK DISSECTION  10/18/2011   Procedure: RADICAL NECK DISSECTION;  Surgeon: Janita Mellow, MD;  Location: [facility] OR;  Service: ENT;  Laterality: Right;  RIGHT MODIFIED NECK DISSECTION /POSSIBLE RIGHT THYROIDECTOM   RIGHT/LEFT HEART CATH AND CORONARY ANGIOGRAPHY N/A 02/16/2020   Procedure: RIGHT/LEFT HEART CATH AND CORONARY ANGIOGRAPHY;  Surgeon: Arleen Lacer, MD;  Location: [facility] INVASIVE CV LAB;  Service: Cardiovascular;  Laterality: N/A;   TEE WITHOUT CARDIOVERSION N/A 03/28/2020   Procedure: TRANSESOPHAGEAL ECHOCARDIOGRAM (TEE);  Surgeon: Sheryle Donning, MD;  Location: [facility] ENDOSCOPY;  Service: Cardiovascular;  Laterality: N/A;   THYROIDECTOMY  10/18/2011   Procedure: THYROIDECTOMY WITH RADICAL NECK DISSECTION;  Surgeon:  Janita Mellow, MD;  Location: [facility] OR;  Service: ENT;  Laterality: Right;    Medications: Prior to Admission medications   Medication Sig Start Date End Date Taking? Authorizing Provider  ACETAMINOPHEN  EXTRA STRENGTH 500 MG tablet Take 1,000 mg by mouth every 8 (eight) hours as needed for moderate pain. 08/28/21   [provider]  bisacodyl  (DULCOLAX) 10 MG suppository Place 1 suppository (10 mg total) rectally as needed for moderate constipation. 07/06/22   Trish Furl, MD  donepezil  (ARICEPT ) 10 MG tablet TAKE 1 TABLET BY MOUTH ONCE DAILY IN THE EVENING Patient taking differently: Take 10 mg by mouth every evening. 04/03/21   Cleave Curling, MD  feeding supplement (ENSURE ENLIVE / ENSURE PLUS) LIQD Take 237 mLs by mouth 2 (two) times daily between meals. 10/04/21   Adhikari, Amrit, MD  hydrocortisone  cream 1 % Apply 1 Application topically 3 (three) times daily.    [provider]  Hydrocortisone , Perianal, 1 % CREA Apply 1 g topically 2 (two) times daily. 01/02/22   [provider]  HYDROCORTISONE , TOPICAL, 2 % LOTN Apply topically 3 (three) times daily.    [provider]  ipratropium-albuterol  (DUONEB) 0.5-2.5 (3) MG/3ML SOLN Take 3 mLs by nebulization every 6 (six) hours as needed. 06/21/23   [provider]  Magnesium  200 MG TABS Take 200 mg by mouth daily.    [provider]  mirtazapine  (REMERON ) 7.5 MG tablet Take 7.5 mg by mouth at bedtime. 07/11/23   [provider]  Multiple Vitamin (MULTI-VITAMINS) TABS Take 1 tablet by mouth every evening.     [provider]  NON FORMULARY Apply 1 application topically every 4 (four) hours as needed (for rash to buttocks).    [provider]  nystatin  powder Apply 1 Application topically 3 (three) times daily.    [provider]  psyllium (METAMUCIL) 58.6 % packet Take 1 packet by mouth daily.    [provider]  saccharomyces boulardii (FLORASTOR) 250 MG  capsule Take 1 capsule (250 mg total) by mouth 2 (two) times daily. 10/04/21   Leona Rake, MD  Skin Protectants, Misc. (EUCERIN) cream Apply 1 Application topically 2 (two) times daily.    [provider]  vitamin B-12 (CYANOCOBALAMIN ) 1000 MCG tablet Take 1,000 mcg by mouth every evening.    [provider]    Allergies:   Allergies  Allergen Reactions   Broccoli [Brassica Oleracea] Other (See Comments)   Fruit Extracts Other (See Comments)    unknown   Sulfa  Antibiotics Other (See Comments)    She cant remember  what happens    Social History:  reports that she has never smoked. She has never used smokeless tobacco. She reports that she does not drink alcohol and does not use drugs.  Family History: Family History  Problem Relation Age of Onset   Gait disorder Mother    Cancer Mother        Meningioma   Cancer Father        Pancreatic   Heart failure Sister    Cancer Maternal [employer]    Physical Exam: Vitals:   09/03/23 1325 09/03/23 1330 09/03/23 1912 09/04/23 0016  BP:  122/79 129/65 122/82  Pulse:  83 88 (!) 113  Resp:  19 17 19   Temp:   98.7 F (37.1 C) 98.9 F (37.2 C)  TempSrc:    Oral  SpO2: 98% 97% 96% 97%    General:  A&Ox3, chronically ill appearing female, no acute distress Eyes: Pink conjunctiva, no scleral icterus ENT: Moist oral mucosa, neck supple, no thyromegaly Lungs: CTA B/L, no wheeze, no crackles, no use of accessory muscles Cardiovascular: RRR, no regurgitation, no gallops, no murmurs. No carotid bruits, no JVD Abdomen: soft, positive BS, NTND, not an acute abdomen GU: not examined Neuro: CN II - XII grossly intact Musculoskeletal: bedbound Skin: no rash, no subcutaneous crepitation, no decubitus Psych: appropriate patient   Labs on Admission:  Recent Labs    09/03/23 1506 09/03/23 1512  NA 138 137  K 4.7 4.6  CL 103 104  CO2 23  --   GLUCOSE 97 92  BUN 20 23  CREATININE 0.95 0.90  CALCIUM   9.7  --    Recent Labs    09/03/23 1506  AST 21  ALT 14  ALKPHOS 76  BILITOT 0.6  PROT 6.8  ALBUMIN 3.6    Recent Labs    09/03/23 1506 09/03/23 1512  WBC 8.0  --   NEUTROABS 5.3  --   HGB 13.1 13.9  HCT 42.0 41.0  MCV 95.7  --   PLT 268  --     Radiological Exams on Admission: MR BRAIN WO CONTRAST Addendum Date: 09/03/2023 ADDENDUM REPORT: 09/03/2023 22:53 ADDENDUM: There was a synchronization error that caused the wrong report to be associated with this scan. The below is a corrected report that corresponds to the scan for Valerie [name]. Brain: There is a small acute infarct at the tail of the right caudate. Fewer than 5 scattered microhemorrhages in a nonspecific pattern. There is multifocal hyperintense T2-weighted signal within the white matter. Generalized volume loss. The midline structures are normal. Vascular: Normal flow voids. Skull and upper cervical spine: Normal calvarium and skull base. Visualized upper cervical spine and soft tissues are normal. Sinuses/Orbits:No paranasal sinus fluid levels or advanced mucosal thickening. No mastoid or middle ear effusion. Normal orbits. IMPRESSION: 1. A software synchronization error caused an incorrect report to be associated with this examination. Please disregard the initial report. 2. There is a small acute infarct of the right caudate nucleus. No acute hemorrhage. 3. Chronic ischemic microangiopathy. These results were called by telephone at the time of interpretation on 09/03/2023 at 10:51 pm to provider [facility] , who verbally acknowledged these results. Electronically Signed   By: Juanetta Nordmann M.D.   On: 09/03/2023 22:53   Result Date: 09/03/2023 CLINICAL DATA:  Acute neurologic deficit EXAM: MRI HEAD WITHOUT CONTRAST TECHNIQUE: Multiplanar, multiecho pulse sequences of the brain and surrounding structures were obtained without intravenous contrast. COMPARISON:  10/01/2021 FINDINGS: Brain: There is DWI  hyperintensity throughout much of the left MCA territory, subjacent to a large area of encephalomalacia. No corresponding ADC deficit. Hemosiderin deposition over the left  MCA encephalomalacia territory. Multifocal chronic microhemorrhage in a nonspecific pattern. There is multifocal hyperintense T2-weighted signal within the white matter. Parenchymal volume and CSF spaces are normal. 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 The midline structures are normal. Vascular: Normal flow voids. Skull and upper cervical spine: Normal calvarium and skull base. Visualized upper cervical spine and soft tissues are normal. Sinuses/Orbits:No paranasal sinus fluid levels or advanced mucosal thickening. No mastoid or middle ear effusion. Normal orbits. IMPRESSION: 1. DWI hyperintensity throughout much of the left MCA territory, subjacent to a large area of encephalomalacia. This may indicate an area of subacute ischemia versus T2 shine through related to the old left MCA infarct. 2. 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 Electronically Signed: By: Juanetta Nordmann M.D. On: 09/03/2023 22:18   CT HEAD WO CONTRAST Result Date: 09/03/2023 CLINICAL DATA:  Weakness, stroke suspected EXAM: CT HEAD WITHOUT CONTRAST TECHNIQUE: Contiguous axial images were obtained from the base of the skull through the vertex without intravenous contrast. RADIATION DOSE REDUCTION: This exam was performed according to the departmental dose-optimization program which includes automated exposure control, adjustment of the mA and/or kV according to patient size and/or use of iterative reconstruction technique. COMPARISON:  10/01/2021 CT head FINDINGS: Brain: No evidence of acute infarction, hemorrhage, mass, mass effect, or midline shift. No hydrocephalus or extra-axial fluid collection. Redemonstrated cerebellar atrophy. Redemonstrated encephalomalacia with laminar necrosis and calcifications in the bilateral occipital  lobes. Periventricular white matter changes, likely the sequela of chronic small vessel ischemic disease. More focal hypodensity in the right posterior lentiform nucleus and corona radiata is new from the prior exam and favored to represent a remote lacunar infarct. Vascular: No hyperdense vessel. Skull: Prior left parietooccipital craniotomy. Negative for fracture or focal lesion. Sinuses/Orbits: No acute finding. Other: The mastoid air cells are well aerated. IMPRESSION: 1. No acute intracranial process. 2. More focal hypodensity in the right posterior lentiform nucleus and corona radiata is new from the prior exam and favored to represent a remote lacunar infarct. If there is concern for an acute infarct, consider MRI for further evaluation. Electronically Signed   By: Zoila Hines M.D.   On: 09/03/2023 17:12    Assessment/Plan Present on Admission:  Acute CVA (cerebrovascular accident) (HCC) -TIA order set initiated -Teleneurologist seen patient -Aspirin  and Plavix  recommended DAPT for 21 days, then aspirin  81 mg days after -CTA head and neck, 2D echo ordered -PT/OT/speech therapy consult placed -Permissive hypertension -Lipid panel in morning.  Statin if indicated   Dementia (HCC) -Benazepril resumed   Constipation -Continue Metamucil   History of metastatic malignant neoplasm (HCC)  History of malignant neoplasm of brain (HCC)  Valerie [name] 09/04/2023, 12:32 AM

## 2023-09-04 NOTE — Progress Notes (Signed)
 Echocardiogram 2D Echocardiogram has been performed.  Emmaline Haring Orel Cooler RDCS 09/04/2023, 11:55 AM

## 2023-09-04 NOTE — ED Notes (Signed)
 Crosley, MD notified of change in Stroke Scale score compared to previous documented by physician. Writer is unaware of what is baseline for patient vs. Acute change. Crosley, MD states she is coming down to evaluate.

## 2023-09-04 NOTE — Plan of Care (Signed)
  Problem: Education: Goal: Knowledge of disease or condition will improve Outcome: Progressing Goal: Knowledge of secondary prevention will improve (MUST DOCUMENT ALL) Outcome: Progressing Goal: Knowledge of patient specific risk factors will improve Loraine Leriche N/A or DELETE if not current risk factor) Outcome: Progressing   Problem: Ischemic Stroke/TIA Tissue Perfusion: Goal: Complications of ischemic stroke/TIA will be minimized Outcome: Progressing   Problem: Coping: Goal: Will verbalize positive feelings about self Outcome: Progressing Goal: Will identify appropriate support needs Outcome: Progressing   Problem: Health Behavior/Discharge Planning: Goal: Ability to manage health-related needs will improve Outcome: Progressing Goal: Goals will be collaboratively established with patient/family Outcome: Progressing   Problem: Self-Care: Goal: Ability to participate in self-care as condition permits will improve Outcome: Progressing Goal: Verbalization of feelings and concerns over difficulty with self-care will improve Outcome: Progressing Goal: Ability to communicate needs accurately will improve Outcome: Progressing   Problem: Nutrition: Goal: Risk of aspiration will decrease Outcome: Progressing Goal: Dietary intake will improve Outcome: Progressing   Problem: Education: Goal: Knowledge of General Education information will improve Description: Including pain rating scale, medication(s)/side effects and non-pharmacologic comfort measures Outcome: Progressing   Problem: Health Behavior/Discharge Planning: Goal: Ability to manage health-related needs will improve Outcome: Progressing   Problem: Clinical Measurements: Goal: Ability to maintain clinical measurements within normal limits will improve Outcome: Progressing Goal: Will remain free from infection Outcome: Progressing Goal: Diagnostic test results will improve Outcome: Progressing Goal: Respiratory  complications will improve Outcome: Progressing Goal: Cardiovascular complication will be avoided Outcome: Progressing   Problem: Activity: Goal: Risk for activity intolerance will decrease Outcome: Progressing   Problem: Nutrition: Goal: Adequate nutrition will be maintained Outcome: Progressing   Problem: Coping: Goal: Level of anxiety will decrease Outcome: Progressing   Problem: Elimination: Goal: Will not experience complications related to bowel motility Outcome: Progressing Goal: Will not experience complications related to urinary retention Outcome: Progressing   Problem: Pain Managment: Goal: General experience of comfort will improve and/or be controlled Outcome: Progressing   Problem: Safety: Goal: Ability to remain free from injury will improve Outcome: Progressing   Problem: Skin Integrity: Goal: Risk for impaired skin integrity will decrease Outcome: Progressing

## 2023-09-04 NOTE — ED Notes (Signed)
 Pt cleaned up of urinary and bowel incontinence. Able to roll to help with brief change. Tolerated well.

## 2023-09-04 NOTE — Progress Notes (Addendum)
 STROKE TEAM PROGRESS NOTE   BRIEF HPI Ms. Valerie [name] is a 78 y.o. female with history of lung cancer with brain mets, osteoporosis who is wheelchair-bound and resides in a facility presenting with 2 to 3 days of left-sided weakness.  MRI demonstrates right caudate stroke  NIH on Admission 9   INTERIM HISTORY/SUBJECTIVE Patient has remained hemodynamically stable overnight.  She continues to have some left hemiparesis.  She is unable to relate the events that led to her coming to the hospital.  OBJECTIVE  CBC    Component Value Date/Time   WBC 13.4 (H) 09/04/2023 0412   RBC 4.74 09/04/2023 0412   HGB 13.9 09/04/2023 0412   HGB 12.6 03/01/2023 1015   HGB 12.7 07/18/2021 1421   HGB 13.1 08/06/2017 1047   HCT 44.1 09/04/2023 0412   HCT 39.8 07/18/2021 1421   HCT 40.9 08/06/2017 1047   PLT 302 09/04/2023 0412   PLT 266 03/01/2023 1015   PLT 347 07/18/2021 1421   MCV 93.0 09/04/2023 0412   MCV 88 07/18/2021 1421   MCV 91.9 08/06/2017 1047   MCH 29.3 09/04/2023 0412   MCHC 31.5 09/04/2023 0412   RDW 13.8 09/04/2023 0412   RDW 12.9 07/18/2021 1421   RDW 14.7 (H) 08/06/2017 1047   LYMPHSABS 1.1 09/04/2023 0412   LYMPHSABS 1.1 07/18/2021 1421   LYMPHSABS 0.7 (L) 08/06/2017 1047   MONOABS 0.7 09/04/2023 0412   MONOABS 0.3 08/06/2017 1047   EOSABS 0.1 09/04/2023 0412   EOSABS 0.1 07/18/2021 1421   BASOSABS 0.0 09/04/2023 0412   BASOSABS 0.0 07/18/2021 1421   BASOSABS 0.0 08/06/2017 1047    BMET    Component Value Date/Time   NA 139 09/04/2023 0412   NA 140 07/18/2021 1421   NA 141 08/06/2017 1047   K 4.5 09/04/2023 0412   K 3.9 08/06/2017 1047   CL 106 09/04/2023 0412   CL 106 12/09/2012 0806   CO2 22 09/04/2023 0412   CO2 24 08/06/2017 1047   GLUCOSE 117 (H) 09/04/2023 0412   GLUCOSE 99 08/06/2017 1047   GLUCOSE 106 (H) 12/09/2012 0806   BUN 18 09/04/2023 0412   BUN 19 07/18/2021 1421   BUN 15.4 08/06/2017 1047   CREATININE 0.94 09/04/2023 0412    CREATININE 1.08 (H) 03/01/2023 1015   CREATININE 1.0 08/06/2017 1047   CALCIUM  9.7 09/04/2023 0412   CALCIUM  9.8 08/06/2017 1047   EGFR 54 (L) 07/18/2021 1421   GFRNONAA >60 09/04/2023 0412   GFRNONAA 53 (L) 03/01/2023 1015    IMAGING past 24 hours CT ANGIO HEAD NECK W WO CM Result Date: 09/04/2023 CLINICAL DATA:  Acute neurologic defect. EXAM: CT ANGIOGRAPHY HEAD AND NECK WITH AND WITHOUT CONTRAST TECHNIQUE: Multidetector CT imaging of the head and neck was performed using the standard protocol during bolus administration of intravenous contrast. Multiplanar CT image reconstructions and MIPs were obtained to evaluate the vascular anatomy. Carotid stenosis measurements (when applicable) are obtained utilizing NASCET criteria, using the distal internal carotid diameter as the denominator. RADIATION DOSE REDUCTION: This exam was performed according to the departmental dose-optimization program which includes automated exposure control, adjustment of the mA and/or kV according to patient size and/or use of iterative reconstruction technique. CONTRAST:  75mL OMNIPAQUE  IOHEXOL  350 MG/ML SOLN COMPARISON:  Brain MRI from yesterday. CTA of the head neck 10/02/2021 FINDINGS: CT HEAD FINDINGS Noncontrast head CT is motion degraded with most images being nondiagnostic known acute white matter infarct with no evidence of superimposed  hemorrhage. Dystrophic cortical linear calcification in the bilateral occipital lobes, greater on the left beneath the craniotomy site. CTA NECK FINDINGS Aortic arch: 3 vessel branching and atherosclerosis. Right carotid system: Atheromatous calcification at the bifurcation. No stenosis or ulceration. Left carotid system: Mild atheromatous plaque at the bifurcation. No stenosis or ulceration. Vertebral arteries: Subclavian atherosclerosis without significant stenosis. The vertebral arteries are smoothly contoured and diffusely patent. Skeleton: No acute finding Other neck: No acute  finding Upper chest: Partially covered layering pleural effusions and right upper lobe airway opacification stable from 03/01/2023. Known history of lung cancer. Review of the MIP images confirms the above findings CTA HEAD FINDINGS Anterior circulation: Mild atheromatous calcification of the cavernous carotids. No major branch occlusion, beading, or aneurysm. Posterior circulation: Small vertebral and basilar arteries in the setting of fetal type bilateral PCA flow. Segmental narrowing of the mid and distal basilar with high-grade stenoses at the upper and lower margins of this stenosis, progressed, see 16:63. Venous sinuses: Unremarkable for arterial timing. Review of the MIP images confirms the above findings IMPRESSION: 1. No acute arterial finding to correlate with the acute infarct in the right cerebral white matter. 2. Progressive and high-grade narrowing of the basilar compared to 10/01/2021, presumably atheromatous. There is fetal type bilateral PCA flow. 3. The noncontrast head CT portion of the study is nondiagnostic due to motion. 4. Airway thickening and opacification in the right upper lobe also noted on most recent staging chest CT, attention on follow-up. Electronically Signed   By: Ronnette Coke M.D.   On: 09/04/2023 05:24   MR BRAIN WO CONTRAST Addendum Date: 09/03/2023 ADDENDUM REPORT: 09/03/2023 22:53 ADDENDUM: There was a synchronization error that caused the wrong report to be associated with this scan. The below is a corrected report that corresponds to the scan for Valerie [name]. Brain: There is a small acute infarct at the tail of the right caudate. Fewer than 5 scattered microhemorrhages in a nonspecific pattern. There is multifocal hyperintense T2-weighted signal within the white matter. Generalized volume loss. The midline structures are normal. Vascular: Normal flow voids. Skull and upper cervical spine: Normal calvarium and skull base. Visualized upper cervical spine and soft  tissues are normal. Sinuses/Orbits:No paranasal sinus fluid levels or advanced mucosal thickening. No mastoid or middle ear effusion. Normal orbits. IMPRESSION: 1. A software synchronization error caused an incorrect report to be associated with this examination. Please disregard the initial report. 2. There is a small acute infarct of the right caudate nucleus. No acute hemorrhage. 3. Chronic ischemic microangiopathy. These results were called by telephone at the time of interpretation on 09/03/2023 at 10:51 pm to provider [facility] , who verbally acknowledged these results. Electronically Signed   By: Juanetta Nordmann M.D.   On: 09/03/2023 22:53   Result Date: 09/03/2023 CLINICAL DATA:  Acute neurologic deficit EXAM: MRI HEAD WITHOUT CONTRAST TECHNIQUE: Multiplanar, multiecho pulse sequences of the brain and surrounding structures were obtained without intravenous contrast. COMPARISON:  10/01/2021 FINDINGS: Brain: There is DWI hyperintensity throughout much of the left MCA territory, subjacent to a large area of encephalomalacia. No corresponding ADC deficit. Hemosiderin deposition over the left MCA encephalomalacia territory. Multifocal chronic microhemorrhage in a nonspecific pattern. There is multifocal hyperintense T2-weighted signal within the white matter. Parenchymal volume and CSF spaces are normal. 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 The midline structures are normal. Vascular: Normal flow voids. Skull and upper cervical spine: Normal calvarium and skull base. Visualized upper cervical spine and soft tissues  are normal. Sinuses/Orbits:No paranasal sinus fluid levels or advanced mucosal thickening. No mastoid or middle ear effusion. Normal orbits. IMPRESSION: 1. DWI hyperintensity throughout much of the left MCA territory, subjacent to a large area of encephalomalacia. This may indicate an area of subacute ischemia versus T2 shine through related to the old left MCA infarct. 2.  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 Electronically Signed: By: Juanetta Nordmann M.D. On: 09/03/2023 22:18   CT HEAD WO CONTRAST Result Date: 09/03/2023 CLINICAL DATA:  Weakness, stroke suspected EXAM: CT HEAD WITHOUT CONTRAST TECHNIQUE: Contiguous axial images were obtained from the base of the skull through the vertex without intravenous contrast. RADIATION DOSE REDUCTION: This exam was performed according to the departmental dose-optimization program which includes automated exposure control, adjustment of the mA and/or kV according to patient size and/or use of iterative reconstruction technique. COMPARISON:  10/01/2021 CT head FINDINGS: Brain: No evidence of acute infarction, hemorrhage, mass, mass effect, or midline shift. No hydrocephalus or extra-axial fluid collection. Redemonstrated cerebellar atrophy. Redemonstrated encephalomalacia with laminar necrosis and calcifications in the bilateral occipital lobes. Periventricular white matter changes, likely the sequela of chronic small vessel ischemic disease. More focal hypodensity in the right posterior lentiform nucleus and corona radiata is new from the prior exam and favored to represent a remote lacunar infarct. Vascular: No hyperdense vessel. Skull: Prior left parietooccipital craniotomy. Negative for fracture or focal lesion. Sinuses/Orbits: No acute finding. Other: The mastoid air cells are well aerated. IMPRESSION: 1. No acute intracranial process. 2. More focal hypodensity in the right posterior lentiform nucleus and corona radiata is new from the prior exam and favored to represent a remote lacunar infarct. If there is concern for an acute infarct, consider MRI for further evaluation. Electronically Signed   By: Zoila Hines M.D.   On: 09/03/2023 17:12    Vitals:   09/04/23 0330 09/04/23 0400 09/04/23 0800 09/04/23 0901  BP: 109/64 104/67 108/63 (!) 98/50  Pulse: (!) 114 (!) 110 96 99  Resp: (!) 26 13 (!) 22 15  Temp:   98 F (36.7 C)  97.8 F (36.6 C)  TempSrc:  Oral  Axillary  SpO2: 96% 97% 95% 97%     PHYSICAL EXAM General: Chronically ill-appearing elderly African-American lady in no acute distress Psych:  Mood and affect appropriate for situation CV: Regular rate and rhythm on monitor Respiratory:  Regular, unlabored respirations on room air  NEURO:  Mental Status: Alert and oriented to person and place, disoriented to time and unable to state why she is in the hospital or recount parts of her medical history Speech/Language: speech is without dysarthria or aphasia.    Cranial Nerves:  II: PERRL.  Blinks to threat bilaterally III, IV, VI: EOMI. Eyelids elevate symmetrically.  V: Sensation is intact to light touch and symmetrical to face.  VII: Left facial droop VIII: hearing intact to voice. IX, X: Phonation is normal.  XII: tongue is midline without fasciculations. Motor: Able to move bilateral upper extremities with antigravity strength, but drift noted in left arm moves bilateral lower extremities, right stronger than left but is unable to lift them off the bed Tone: is normal and bulk is normal Sensation- Intact to light touch bilaterally.   Gait- deferred  Most Recent NIH  1a Level of Conscious.: 0 1b LOC Questions: 1 1c LOC Commands: 0 2 Best Gaze: 0 3 Visual: 0 4 Facial Palsy: 1 5a Motor Arm - left: 1 5b Motor Arm - Right: 0 6a  Motor Leg - Left: 3 6b Motor Leg - Right: 3 7 Limb Ataxia: 0 8 Sensory: 0 9 Best Language: 0 10 Dysarthria: 0 11 Extinct. and Inatten.: 0 TOTAL: 9   ASSESSMENT/PLAN  Acute Ischemic Infarct:  right caudate tail infarct Etiology: Possibly embolic due to A-fib not on anticoagulation CT head No acute abnormality.  Focal hypodensity in right posterior lentiform nucleus and corona radiata CTA head & neck no LVO, high-grade narrowing of the basilar artery with bilateral fetal PCAs MRI small acute infarct of right caudate nucleus 2D Echo decreased  ejection fraction 25 to 30%.  Severely decreased LV function and global hypokinesis.  Left atrium size is normal HgbA1c 5.0 VTE prophylaxis -subcutaneous heparin  No antithrombotic prior to admission, now on aspirin  81 mg daily and clopidogrel  75 mg daily for 3 weeks and then aspirin  alone. Therapy recommendations:  Pending Disposition: Pending, likely return to facility  Hx of Stroke/TIA Patient has history of a TIA in 2/23  Atrial fibrillation Home Meds: None, not on anticoagulation due to history of hematuria Continue telemetry monitoring Consider anticoagulation with Eliquis  given new stroke  Hypertension Home meds: None Stable Blood Pressure Goal: BP less than 220/110   Hyperlipidemia Home meds: None LDL 168, goal < 70 Add rosuvastatin  20 mg daily Continue statin at discharge  Other Stroke Risk Factors Congestive heart failure   Other Active Problems None  Hospital day # 0  Patient seen by NP with MD, MD to edit note as needed. Cortney E Bucky Cardinal , MSN, AGACNP-BC [facility] See Amion for schedule and pager information 09/04/2023 12:25 PM   STROKE MD NOTE :  I have personally obtained history,examined this patient, reviewed notes, independently viewed imaging studies, participated in medical decision making and plan of care.ROS completed by me personally and pertinent positives fully documented  I have made any additions or clarifications directly to the above note. Agree with note above.  Patient presented with several days history of left-sided weakness due to large right subcortical infarct likely cardioembolic from history of A-fib not on anticoagulation and cardiomyopathy with low ejection fraction.  Recommend continue anticoagulation with Eliquis  and aggressive risk factor modification.  Physical occupational speech therapy consults.  Mobilize out of bed.  No family available at the bedside.  Discussed with Dr. Hilton Lucky Greater than 50% time during  this 50-minute visit was spent in counseling and coordination of care about her stroke and discussion about stroke risk from atrial fibrillation and risk reduction with anticoagulation and answering questions Ardella Beaver, MD Medical Director [facility] Pager: [phone] 09/04/2023 4:38 PM   To contact Stroke Continuity provider, please refer to [email or web address]. After hours, contact General Neurology

## 2023-09-04 NOTE — ED Notes (Signed)
 Pt had BM and urinary occurrence in brief. Writer and RN performed peri care and changed brief and linen.

## 2023-09-04 NOTE — Progress Notes (Signed)
 Patient seen and examined.  Also called and updated patient's daughter on the phone.  Patient with extensive medical history, currently long-term nursing home resident and moving around on wheelchair noted to have left-sided weakness, left facial droop and less strength on transfer to wheelchair brought to the ER.  She was found to have right caudate nucleus infarct.  Neurology following.  Not a tPA candidate.  Stroke workup in progress.  Started on aspirin  Plavix , statin.  Will work with PT OT.  Plan: Completed stroke workup.  Anticipate back to nursing home with short-term rehab option.   Same-day admit.  No charge visit.

## 2023-09-04 NOTE — ED Notes (Signed)
 Pt's daughter, Gretchen Leavell ([phone]) would like to hear any updates on the pt.

## 2023-09-04 NOTE — Evaluation (Signed)
 Physical Therapy Evaluation Patient Details Name: Valerie [name] MRN: [id number] DOB: 1945/10/05 Today's Date: 09/04/2023  History of Present Illness  78 y.o. female presents to [facility] hospital on 09/[facility]/2022 with L weakness. MRI with subacute R caudate infarct. PMH includes metastatic lung cancer, dementia, anxiety, OA, prior CVA.  Clinical Impression  Pt presents to PT with L sided weakness compared to her baseline. Pt resides at [facility] and reports she is assisted in transferring to her wheelchair with hoyer lift by staff. Pt reports that once in her wheelchair she is able to scoot around on her own. PT will need to confirm this with staff/family. PT does note weakness in L side compared to R, and pt requires significant physical assistance for functional mobility. If pt reports of baseline are accurate the pt will benefit from short term PT services at [facility] to aide in improving strength and reducing caregiver burden for wheelchair mobility.      If plan is discharge home, recommend the following: Two people to help with walking and/or transfers;Two people to help with bathing/dressing/bathroom;Assistance with cooking/housework;Assistance with feeding;Direct supervision/assist for medications management;Direct supervision/assist for financial management;Assist for transportation;Help with stairs or ramp for entrance;Supervision due to cognitive status   Can travel by private vehicle   No    Equipment Recommendations None recommended by PT  Recommendations for Other Services       Functional Status Assessment Patient has had a recent decline in their functional status and demonstrates the ability to make significant improvements in function in a reasonable and predictable amount of time.     Precautions / Restrictions Precautions Precautions: Fall Restrictions Weight Bearing Restrictions Per Provider Order: No      Mobility  Bed Mobility Overal bed mobility: Needs  Assistance Bed Mobility: Supine to Sit, Sit to Supine     Supine to sit: Max assist, HOB elevated Sit to supine: Total assist        Transfers                        Ambulation/Gait                  Stairs            Wheelchair Mobility     Tilt Bed    Modified Rankin (Stroke Patients Only) Modified Rankin (Stroke Patients Only) Pre-Morbid Rankin Score: Severe disability Modified Rankin: Severe disability     Balance Overall balance assessment: Needs assistance Sitting-balance support: Bilateral upper extremity supported, Feet unsupported Sitting balance-Leahy Scale: Zero Sitting balance - Comments: totalA, posterior lean Postural control: Posterior lean                                   Pertinent Vitals/Pain Pain Assessment Pain Assessment: No/denies pain    Home Living Family/patient expects to be discharged to:: Assisted living                   Additional Comments: pt resides at [facility]    Prior Function Prior Level of Function : Needs assist             Mobility Comments: pt reports she is a dependent transfe rvia hoyer lift. Pt does not sit up much other than in her wheelchair. Pt reports that she is able to scoot around in her manual wheelchair after transferring with lift. Will need to confirm  PLOF with staff/family       Extremity/Trunk Assessment   Upper Extremity Assessment Upper Extremity Assessment: RUE deficits/detail;LUE deficits/detail RUE Deficits / Details: ROM WFL, grossly 4-/5 LUE Deficits / Details: ROM WFL, grossly 4-/5 although weaker than R side    Lower Extremity Assessment Lower Extremity Assessment: RLE deficits/detail;LLE deficits/detail RLE Deficits / Details: ROM WFL, pt grossly 4-/5 LLE Deficits / Details: ROM WFL, pt with 2/5 ankle DF/PF, 3-/5 otherwise    Cervical / Trunk Assessment Cervical / Trunk Assessment: Kyphotic  Communication    Communication Communication: No apparent difficulties Cueing Techniques: Verbal cues  Cognition Arousal: Alert Behavior During Therapy: WFL for tasks assessed/performed Overall Cognitive Status: History of cognitive impairments - at baseline                                 General Comments: chart notes a history of dementia, pt is alert and oriented to person, place, situation. Pt follows commands well, appropriate for session. Will need to confirm PLOF with facility.        General Comments General comments (skin integrity, edema, etc.): VSS on RA    Exercises     Assessment/Plan    PT Assessment Patient needs continued PT services  PT Problem List Decreased strength;Decreased activity tolerance;Decreased balance;Decreased mobility;Decreased knowledge of use of DME;Decreased safety awareness;Decreased knowledge of precautions       PT Treatment Interventions DME instruction;Functional mobility training;Therapeutic activities;Therapeutic exercise;Balance training;Neuromuscular re-education;Cognitive remediation;Patient/family education;Wheelchair mobility training    PT Goals (Current goals can be found in the Care Plan section)  Acute Rehab PT Goals Patient Stated Goal: to retur to [facility], work on building strength in L side PT Goal Formulation: With patient Time For Goal Achievement: 09/18/23 Potential to Achieve Goals: Poor    Frequency Min 1X/week     Co-evaluation               AM-PAC PT "6 Clicks" Mobility  Outcome Measure Help needed turning from your back to your side while in a flat bed without using bedrails?: A Lot Help needed moving from lying on your back to sitting on the side of a flat bed without using bedrails?: A Lot Help needed moving to and from a bed to a chair (including a wheelchair)?: Total Help needed standing up from a chair using your arms (e.g., wheelchair or bedside chair)?: Total Help needed to walk in hospital  room?: Total Help needed climbing 3-5 steps with a railing? : Total 6 Click Score: 8    End of Session   Activity Tolerance: Patient tolerated treatment well Patient left: in bed;with call bell/phone within reach Nurse Communication: Mobility status;Need for lift equipment PT Visit Diagnosis: Other abnormalities of gait and mobility (R26.89);Muscle weakness (generalized) (M62.81);Other symptoms and signs involving the nervous system (R29.898)    Time: 1003-1020 PT Time Calculation (min) (ACUTE ONLY): 17 min   Charges:   PT Evaluation $PT Eval Low Complexity: 1 Low   PT General Charges $$ ACUTE PT VISIT: 1 Visit         Rexie Catena, PT, DPT Acute Rehabilitation Office [phone]   Rexie Catena 09/04/2023, 12:30 PM

## 2023-09-05 ENCOUNTER — Encounter (HOSPITAL_COMMUNITY): Payer: Medicare PPO

## 2023-09-05 ENCOUNTER — Other Ambulatory Visit (HOSPITAL_COMMUNITY): Payer: Self-pay

## 2023-09-05 ENCOUNTER — Telehealth (HOSPITAL_COMMUNITY): Payer: Self-pay | Admitting: Pharmacy Technician

## 2023-09-05 DIAGNOSIS — I639 Cerebral infarction, unspecified: Secondary | ICD-10-CM | POA: Diagnosis not present

## 2023-09-05 LAB — MRSA NEXT GEN BY PCR, NASAL: MRSA by PCR Next Gen: NOT DETECTED

## 2023-09-05 MED ORDER — NITROFURANTOIN MONOHYD MACRO 100 MG PO CAPS
100.0000 mg | ORAL_CAPSULE | Freq: Every day | ORAL | Status: DC
  Administered 2023-09-05 – 2023-09-06 (×2): 100 mg via ORAL
  Filled 2023-09-05 (×3): qty 1

## 2023-09-05 MED ORDER — MAGNESIUM OXIDE -MG SUPPLEMENT 400 (240 MG) MG PO TABS
400.0000 mg | ORAL_TABLET | Freq: Every day | ORAL | Status: DC
  Administered 2023-09-05 – 2023-09-07 (×3): 400 mg via ORAL
  Filled 2023-09-05 (×3): qty 1

## 2023-09-05 MED ORDER — MIRTAZAPINE 15 MG PO TABS
7.5000 mg | ORAL_TABLET | Freq: Every day | ORAL | Status: DC
  Administered 2023-09-05 – 2023-09-06 (×2): 7.5 mg via ORAL
  Filled 2023-09-05 (×2): qty 1

## 2023-09-05 MED ORDER — VITAMIN B-12 1000 MCG PO TABS
1000.0000 ug | ORAL_TABLET | Freq: Every evening | ORAL | Status: DC
Start: 2023-09-05 — End: 2023-09-07
  Administered 2023-09-05 – 2023-09-06 (×2): 1000 ug via ORAL
  Filled 2023-09-05 (×2): qty 1

## 2023-09-05 MED ORDER — DONEPEZIL HCL 10 MG PO TABS
10.0000 mg | ORAL_TABLET | Freq: Every evening | ORAL | Status: DC
  Administered 2023-09-05 – 2023-09-06 (×2): 10 mg via ORAL
  Filled 2023-09-05 (×2): qty 1

## 2023-09-05 MED ORDER — SENNOSIDES-DOCUSATE SODIUM 8.6-50 MG PO TABS
1.0000 | ORAL_TABLET | Freq: Every day | ORAL | Status: DC
  Administered 2023-09-05 – 2023-09-07 (×3): 1 via ORAL
  Filled 2023-09-05 (×3): qty 1

## 2023-09-05 MED ORDER — APIXABAN 5 MG PO TABS
5.0000 mg | ORAL_TABLET | Freq: Two times a day (BID) | ORAL | Status: DC
  Administered 2023-09-05 – 2023-09-07 (×5): 5 mg via ORAL
  Filled 2023-09-05 (×5): qty 1

## 2023-09-05 MED ORDER — SACCHAROMYCES BOULARDII 250 MG PO CAPS
250.0000 mg | ORAL_CAPSULE | Freq: Two times a day (BID) | ORAL | Status: DC
  Administered 2023-09-05 – 2023-09-07 (×4): 250 mg via ORAL
  Filled 2023-09-05 (×5): qty 1

## 2023-09-05 MED ORDER — ADULT MULTIVITAMIN W/MINERALS CH
1.0000 | ORAL_TABLET | Freq: Every evening | ORAL | Status: DC
Start: 2023-09-05 — End: 2023-09-07
  Administered 2023-09-05 – 2023-09-06 (×2): 1 via ORAL
  Filled 2023-09-05 (×2): qty 1

## 2023-09-05 MED ORDER — POLYETHYLENE GLYCOL 3350 17 G PO PACK
17.0000 g | PACK | Freq: Every day | ORAL | Status: DC
  Administered 2023-09-05 – 2023-09-07 (×3): 17 g via ORAL
  Filled 2023-09-05 (×3): qty 1

## 2023-09-05 NOTE — Progress Notes (Signed)
Heart Failure Navigator Progress Note  Assessed for Heart & Vascular TOC clinic readiness.  Patient does not meet criteria due to per MD note patient with . Metastatic cancer and dementia   Navigator will sign off at this time.   Rhae Hammock, BSN, Scientist, clinical (histocompatibility and immunogenetics) Only

## 2023-09-05 NOTE — Discharge Instructions (Signed)

## 2023-09-05 NOTE — Telephone Encounter (Signed)
Patient Product/process development scientist completed.    The patient is insured through [employer]. Patient has Medicare and is not eligible for a copay card, but may be able to apply for patient assistance or Medicare RX Payment Plan (Patient Must reach out to their plan, if eligible for payment plan), if available.    Ran test claim for Eliquis 5 mg and the current 30 day co-pay is $40.00.   This test claim was processed through [facility]- copay amounts may vary at other pharmacies due to pharmacy/plan contracts, or as the patient moves through the different stages of their insurance plan.     Roland Earl, CPHT Pharmacy Technician III Certified Patient Advocate [facility] Patient Advocate Team Direct Number: [phone]  Fax: [phone]

## 2023-09-05 NOTE — Evaluation (Signed)
Speech Language Pathology Evaluation Patient Details Name: Valerie [name] MRN: [id number] DOB: 07-Nov-1945 Today's Date: 09/05/2023 Time: 4098-1191 SLP Time Calculation (min) (ACUTE ONLY): 15 min  Problem List:  Patient Active Problem List   Diagnosis Date Noted   Acute CVA (cerebrovascular accident) (HCC) 09/04/2023   Dysarthria 10/01/2021   Acute metabolic encephalopathy 10/01/2021   UTI (urinary tract infection) 10/01/2021   COVID-19 virus infection 10/01/2021   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   PAF (paroxysmal atrial fibrillation) (HCC) 07/07/2021   History of gross hematuria 07/07/2021   Atherosclerosis of aorta (HCC) 05/11/2021   Complicated UTI (urinary tract infection) 04/17/2021   Malnutrition of moderate degree 06/13/2020   Rash and nonspecific skin eruption    Metastatic malignant neoplasm (HCC)    Failure to thrive in adult    Asymptomatic bacteriuria    Pressure injury of skin 06/03/2020   Cellulitis 06/03/2020   SVT (supraventricular tachycardia) (HCC) 03/26/2020   Normocytic anemia 03/24/2020   Urinary tract infection without hematuria    AMS (altered mental status) 03/23/2020   Hypokalemia 03/23/2020   Prolonged Q-T interval on ECG 03/23/2020   DNR (do not resuscitate) 03/23/2020   MRSA bacteremia 03/23/2020   Acute combined systolic and diastolic heart failure (HCC)    Elevated troponin    Dilated cardiomyopathy (HCC)    Nonocclusive coronary atherosclerosis of native coronary artery    Congestive heart failure (CHF) (HCC) 02/14/2020   Pyelonephritis 09/10/2019   Acute blood loss anemia 06/12/2019   Closed fracture of distal end of right femur, initial encounter (HCC) 06/11/2019   Closed fracture of lateral portion of right tibial plateau 06/11/2019   History of DVT of lower extremity 06/11/2019   Blurry vision 01/06/2019   Headache 07/24/2016   Cough 08/24/2015   Chronic diarrhea 08/24/2015   Sepsis (HCC)    Encounter for  antineoplastic chemotherapy 01/31/2015   Urinary retention 10/20/2014   Ileus (HCC) 10/20/2014   Cellulitis of leg, right 10/20/2014   Fracture of right proximal fibula    Ankle fracture 10/02/2014   Ankle fracture 10/02/2014   Lung cancer (HCC) 10/02/2014   Closed fibular fracture 10/02/2014   Dementia (HCC) 10/02/2014   Lower urinary tract infectious disease 10/02/2014   Reactive airway disease 10/02/2014   Fall 10/02/2014   Closed left ankle fracture 10/01/2014   Right fibular fracture 10/01/2014   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)    LESION, ANUS 04/26/2009   DIARRHEA 04/26/2009   Primary cancer of right upper lobe of lung (HCC) 04/22/2009   Malignant neoplasm of brain (HCC) 04/22/2009   VITAMIN D DEFICIENCY 04/22/2009   ANAL FISTULA 04/22/2009   OSTEOPOROSIS 04/22/2009   Past Medical History:  Past Medical History:  Diagnosis Date   Abnormal Pap smear 2006   Anal fistula    Ankle fracture    Anxiety    Arthritis    Atrophic vaginitis 2008   Cataract    Dementia (HCC) 2009   Dyspareunia 2008   H/O osteoporosis    H/O varicella    H/O vitamin D deficiency    Headache 07/24/2016   Heart murmur    History of measles, mumps, or rubella    History of radiation therapy 07/28/13- 08/10/13   right lung metastasis 5000 cGy 10 sessions   Hypertension    Lung cancer (HCC) dx'd 2002   Lung cancer (HCC)    Lung cancer (HCC)    Lung metastasis    PET scan 05/05/13, RUL lung  nodule   Metastasis to brain [facility]) dx'd 2008   Metastasis to lymph nodes (HCC) dx'd 09/2011   Nodule of right lung CT- 06/03/12   RIGHT UPPER LOBE   Nodule of right lung 06/03/12   Upper Lobe   On antineoplastic chemotherapy    TARCEVA   Osteoporosis 2010   Primary cancer of right upper lobe of lung (HCC) 04/22/2009   Qualifier: Diagnosis of  By: Koleen Distance CMA (AAMA), Leisha     Shortness of breath    hx lung ca    Status post chemotherapy 2003   CARBOPLATIN/PACLITAXEL /STATUS POST CLINICAL  TRAIL OF CELEBREX AND IRESSA AT [facility] FOR 1 YEAR   Status post radiation therapy 2003   LEFT LUNG   Status post radiation therapy 11/07/2005   WHOLE BRAIN: DR Jill Alexanders [facility]   Status post radiation therapy 06/02/2008   GAMMA KNIFE OF RESECTED CAVITAY   Thyroid adenoma    ?   Thyroid cancer (HCC) 10/18/11 bx   adenoid nodules    Yeast infection    Past Surgical History:  Past Surgical History:  Procedure Laterality Date   BRAIN SURGERY     CORONARY ULTRASOUND/IVUS N/A 02/16/2020   Procedure: Intravascular Ultrasound/IVUS;  Surgeon: Marykay Lex, MD;  Location: [facility] INVASIVE CV LAB;  Service: Cardiovascular;  Laterality: N/A;   LEFT LOWER LOBECTOMY  05/2001   LEFT OCCIPITAL CRANIOTOMY  09/21/2005   tumor   LUNG LOBECTOMY     NECK SURGERY     ORIF ANKLE FRACTURE Left 10/02/2014   Procedure: OPEN REDUCTION INTERNAL FIXATION (ORIF) ANKLE FRACTURE;  Surgeon: Harvie Junior, MD;  Location: [facility] ORS;  Service: Orthopedics;  Laterality: Left;   ORIF FEMUR FRACTURE Right 06/11/2019   Procedure: OPEN REDUCTION INTERNAL FIXATION (ORIF) DISTAL FEMUR FRACTURE;  Surgeon: Sheral Apley, MD;  Location: [facility] OR;  Service: Orthopedics;  Laterality: Right;   ORIF TIBIA PLATEAU Right 06/11/2019   Procedure: Open Reduction Internal Fixation (Orif) Tibial Plateau;  Surgeon: Sheral Apley, MD;  Location: [facility] OR;  Service: Orthopedics;  Laterality: Right;   RADICAL NECK DISSECTION  10/18/2011   Procedure: RADICAL NECK DISSECTION;  Surgeon: Serena Colonel, MD;  Location: [facility] OR;  Service: ENT;  Laterality: Right;  RIGHT MODIFIED NECK DISSECTION /POSSIBLE RIGHT THYROIDECTOM   RIGHT/LEFT HEART CATH AND CORONARY ANGIOGRAPHY N/A 02/16/2020   Procedure: RIGHT/LEFT HEART CATH AND CORONARY ANGIOGRAPHY;  Surgeon: Marykay Lex, MD;  Location: [facility] INVASIVE CV LAB;  Service: Cardiovascular;  Laterality: N/A;   TEE WITHOUT CARDIOVERSION N/A 03/28/2020   Procedure: TRANSESOPHAGEAL ECHOCARDIOGRAM (TEE);  Surgeon: Jodelle Red, MD;  Location[facility] ENDOSCOPY;  Service: Cardiovascular;  Laterality: N/A;   THYROIDECTOMY  10/18/2011   Procedure: THYROIDECTOMY WITH RADICAL NECK DISSECTION;  Surgeon: Serena Colonel, MD;  Location: [facility] OR;  Service: ENT;  Laterality: Right;   HPI:  Pt is a 78 year old female with past medical history significant for metastatic lung cancer, dementia.  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  Patient wheelchair dependent, currently in a SNF.  Patient's daughters noted patient has been weak on the left side for the last 3 days.  She brought her to the ER.  In the ER patient hemodynamically stable.  CT head normal.  MRI shows small acute ischemic stroke.   Assessment / Plan / Recommendation Clinical Impression   Pt presents with mild dysarthria and mild word-finding difficulty consistent with expressive aphasia per informal speech/language  assessment completed today. Pt has advanced dementia at baseline per H/P. Family not at bedside to provide details of pt's baseline cognitive functions. Pt reported increased word-finding difficulty and "heavier" speech since acute stroke. SLP observed occasional instances of slurred speech when pt spoke quickly, which impacted intelligibility.  Overall speech intelligibility observed to be >90% to unfamiliar listener. Intermittent word-finding difficulty noted in conversation and during generative naming task. Receptive language appeared grossly WFL.   Plan: SLP will continue to follow for speech/language therapy services as indicated during acute hospitalization.   Recommend follow up SLP services at next level of care if pt and her family do not feel that he speech and language functions have returned to baseline.     SLP Assessment  SLP Recommendation/Assessment: Patient needs continued Speech Lanaguage Pathology Services SLP Visit Diagnosis: Dysarthria and anarthria  (R47.1);Aphasia (R47.01)    Recommendations for follow up therapy are one component of a multi-disciplinary discharge planning process, led by the attending physician.  Recommendations may be updated based on patient status, additional functional criteria and insurance authorization.    Follow Up Recommendations  Follow physician's recommendations for discharge plan and follow up therapies    Assistance Recommended at Discharge  Frequent or constant Supervision/Assistance  Functional Status Assessment Patient has had a recent decline in their functional status and demonstrates the ability to make significant improvements in function in a reasonable and predictable amount of time.  Frequency and Duration min 1 x/week  1 week      SLP Evaluation Cognition  Overall Cognitive Status: History of cognitive impairments - at baseline Arousal/Alertness: Awake/alert Orientation Level: Oriented to person;Disoriented to time;Oriented to place;Oriented to situation Month: February Day of Week: Incorrect       Comprehension  Auditory Comprehension Overall Auditory Comprehension: Impaired Yes/No Questions: Impaired Complex Questions: 75-100% accurate Commands: Within Functional Limits Conversation: Simple Interfering Components: Processing speed EffectiveTechniques: Extra processing time;Repetition Visual Recognition/Discrimination Discrimination: Within Function Limits Reading Comprehension Reading Status: Not tested    Expression Expression Primary Mode of Expression: Verbal Verbal Expression Overall Verbal Expression: Impaired Initiation: No impairment Automatic Speech: Name Level of Generative/Spontaneous Verbalization: Conversation Repetition: Impaired Level of Impairment: Sentence level Naming: No impairment Pragmatics: No impairment Non-Verbal Means of Communication: Not applicable   Oral / Motor  Oral Motor/Sensory Function Overall Oral Motor/Sensory Function: Within  functional limits Motor Speech Overall Motor Speech: Appears within functional limits for tasks assessed Respiration: Within functional limits Phonation: Normal Resonance: Within functional limits Articulation: Impaired Level of Impairment: Conversation Intelligibility: Intelligibility reduced Conversation: 75-100% accurate (>90%; occasional misarticulations) Motor Planning: Witnin functional limits Motor Speech Errors: Not applicable Effective Techniques: Slow rate            Ellery Plunk 09/05/2023, 9:54 AM

## 2023-09-05 NOTE — Progress Notes (Signed)
STROKE TEAM PROGRESS NOTE   BRIEF HPI Ms. Valerie [name] is a 78 y.o. female with history of lung cancer with brain mets, osteoporosis who is wheelchair-bound and resides in a facility presenting with 2 to 3 days of left-sided weakness.  MRI demonstrates right caudate stroke  NIH on Admission 9   INTERIM HISTORY/SUBJECTIVE Patient lying comfortably in bed.  Her daughter is at the bedside..  She continues to have some left hemiparesis.   Neurological exam is unchanged.  Vital signs stable.  OBJECTIVE  CBC    Component Value Date/Time   WBC 13.4 (H) 09/04/2023 0412   RBC 4.74 09/04/2023 0412   HGB 13.9 09/04/2023 0412   HGB 12.6 03/01/2023 1015   HGB 12.7 07/18/2021 1421   HGB 13.1 08/06/2017 1047   HCT 44.1 09/04/2023 0412   HCT 39.8 07/18/2021 1421   HCT 40.9 08/06/2017 1047   PLT 302 09/04/2023 0412   PLT 266 03/01/2023 1015   PLT 347 07/18/2021 1421   MCV 93.0 09/04/2023 0412   MCV 88 07/18/2021 1421   MCV 91.9 08/06/2017 1047   MCH 29.3 09/04/2023 0412   MCHC 31.5 09/04/2023 0412   RDW 13.8 09/04/2023 0412   RDW 12.9 07/18/2021 1421   RDW 14.7 (H) 08/06/2017 1047   LYMPHSABS 1.1 09/04/2023 0412   LYMPHSABS 1.1 07/18/2021 1421   LYMPHSABS 0.7 (L) 08/06/2017 1047   MONOABS 0.7 09/04/2023 0412   MONOABS 0.3 08/06/2017 1047   EOSABS 0.1 09/04/2023 0412   EOSABS 0.1 07/18/2021 1421   BASOSABS 0.0 09/04/2023 0412   BASOSABS 0.0 07/18/2021 1421   BASOSABS 0.0 08/06/2017 1047    BMET    Component Value Date/Time   NA 139 09/04/2023 0412   NA 140 07/18/2021 1421   NA 141 08/06/2017 1047   K 4.5 09/04/2023 0412   K 3.9 08/06/2017 1047   CL 106 09/04/2023 0412   CL 106 12/09/2012 0806   CO2 22 09/04/2023 0412   CO2 24 08/06/2017 1047   GLUCOSE 117 (H) 09/04/2023 0412   GLUCOSE 99 08/06/2017 1047   GLUCOSE 106 (H) 12/09/2012 0806   BUN 18 09/04/2023 0412   BUN 19 07/18/2021 1421   BUN 15.4 08/06/2017 1047   CREATININE 0.94 09/04/2023 0412   CREATININE  1.08 (H) 03/01/2023 1015   CREATININE 1.0 08/06/2017 1047   CALCIUM 9.7 09/04/2023 0412   CALCIUM 9.8 08/06/2017 1047   EGFR 54 (L) 07/18/2021 1421   GFRNONAA >60 09/04/2023 0412   GFRNONAA 53 (L) 03/01/2023 1015    IMAGING past 24 hours No results found.   Vitals:   09/04/23 2319 09/05/23 0357 09/05/23 0731 09/05/23 1154  BP: 125/61 113/68 113/62 115/62  Pulse: 90 93 85 85  Resp: 20 16 17 17   Temp: 98.7 F (37.1 C) 97.7 F (36.5 C) 97.6 F (36.4 C) 97.6 F (36.4 C)  TempSrc: Oral Oral Oral Oral  SpO2: 95% 95% 96% 97%  Weight:      Height:         PHYSICAL EXAM General: Chronically ill-appearing elderly African-American lady in no acute distress Psych:  Mood and affect appropriate for situation CV: Regular rate and rhythm on monitor Respiratory:  Regular, unlabored respirations on room air  NEURO:  Mental Status: Alert and oriented to person and place, disoriented to time and unable to state why she is in the hospital or recount parts of her medical history Speech/Language: speech is without dysarthria or aphasia.    Cranial  Nerves:  II: PERRL.  Blinks to threat bilaterally III, IV, VI: EOMI. Eyelids elevate symmetrically.  V: Sensation is intact to light touch and symmetrical to face.  VII: Left facial droop VIII: hearing intact to voice. IX, X: Phonation is normal.  XII: tongue is midline without fasciculations. Motor: Able to move bilateral upper extremities with antigravity strength, but drift noted in left arm moves bilateral lower extremities, right stronger than left but is unable to lift them off the bed Tone: is normal and bulk is normal Sensation- Intact to light touch bilaterally.   Gait- deferred  Most Recent NIH  1a Level of Conscious.: 0 1b LOC Questions: 1 1c LOC Commands: 0 2 Best Gaze: 0 3 Visual: 0 4 Facial Palsy: 1 5a Motor Arm - left: 1 5b Motor Arm - Right: 0 6a Motor Leg - Left: 3 6b Motor Leg - Right: 3 7 Limb Ataxia: 0 8  Sensory: 0 9 Best Language: 0 10 Dysarthria: 0 11 Extinct. and Inatten.: 0 TOTAL: 9   ASSESSMENT/PLAN  Acute Ischemic Infarct:  right caudate tail infarct Etiology: Possibly embolic due to A-fib not on anticoagulation CT head No acute abnormality.  Focal hypodensity in right posterior lentiform nucleus and corona radiata CTA head & neck no LVO, high-grade narrowing of the basilar artery with bilateral fetal PCAs MRI small acute infarct of right caudate nucleus 2D Echo decreased ejection fraction 25 to 30%.  Severely decreased LV function and global hypokinesis.  Left atrium size is normal HgbA1c 5.0 VTE prophylaxis -subcutaneous heparin No antithrombotic prior to admission, now on Eliquis for A-fib Therapy recommendations:  Pending Disposition: Pending, likely return to facility  Hx of Stroke/TIA Patient has history of a TIA in 2/23  Atrial fibrillation Home Meds: None, not on anticoagulation due to history of hematuria Continue telemetry monitoring Consider anticoagulation with Eliquis given new stroke  Hypertension Home meds: None Stable Blood Pressure Goal: BP less than 220/110   Hyperlipidemia Home meds: None LDL 168, goal < 70 Add rosuvastatin 20 mg daily Continue statin at discharge  Other Stroke Risk Factors Congestive heart failure   Other Active Problems None   Patient presented with several days history of left-sided weakness due to large right subcortical infarct likely cardioembolic from history of A-fib not on anticoagulation and cardiomyopathy with low ejection fraction.  Recommend continue anticoagulation with Eliquis and aggressive risk factor modification.  Physical occupational speech therapy consults.  Mobilize out of bed.  Transfer back to skilled nursing facility for rehab when bed available.  Discussed with daughter at the bedside.  Discussed with Dr. Jerral Ralph.  Stroke team will sign off.  Kindly call for questions   Delia Heady, MD Medical  Director [facility] Pager: [phone] 09/05/2023 2:12 PM   To contact Stroke Continuity provider, please refer to [email or web address]. After hours, contact General Neurology

## 2023-09-05 NOTE — Progress Notes (Signed)
PROGRESS NOTE    Valerie [name]  PNT:[id number] DOB: March 03, 1946 DOA: 09/03/2023 PCP: Pcp, No    Brief Narrative:  78 year old with history of lung cancer with brain mets, osteoporosis, long-term nursing home resident and currently mostly wheelchair-bound presented with about 2 to 3 days of left-sided weakness.  There is possible history of A-fib however not documented by EKG.  She was usually able to get around after help to the wheelchair but last 3 days she was not able to do that.  Found to have acute stroke right caudate nuclei and admitted to the hospital.  Subjective: Patient seen in the morning rounds.  Today she is alert awake and interactive.  Patient tells me that she had a stroke and left side is weak.  She does not realize having swelling of the left leg.  Denies any trauma. Patient does not recall any history of A-fib.  Does not recall any history of hematuria.  Patient is aware about plan to go back to nursing home with rehab.   Assessment & Plan:   Acute ischemic infarct right caudate tail.  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 Clinical findings, acute onset left-sided weakness and drooling for 3 days. CT head findings, no acute findings.  Focal hypodensity right posterior lentiform nucleus and corona radiata. MRI of the brain, small acute infarct right caudate nucleus. CT angiogram of the head and neck, no large vessel occlusion.  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 2D echocardiogram, ejection fraction 25 to 30%.  Left atrium size is normal.  Global hypokinesis.  Similar to previous echocardiogram since last 3 years. Antiplatelet therapy, none at home. LDL 168. Hemoglobin A1c, 5. Patient not on any antithrombotic before coming to the hospital.  She does have history of A-fib, ejection fraction 25% and current symptomatology consistent with embolic stroke.  No clear evidence of complications with  anticoagulation.  Discussed with patient and family, will start patient on Eliquis 5 mg twice daily for stroke prevention.  Suspect DVT of the left lower extremity.  Will check duplexes. Continue to work with PT OT.  Referred back to nursing home with inpatient therapies.  History of  A-fib: Patient currently with sinus rhythm and multiple PACs.  Rate controlled.  Hyperlipidemia: LDL 168.  Added Crestor 20 mg daily.  Chronic systolic congestive heart failure: Euvolemic.  Asymmetrical swelling of the left leg: Rule out DVT.    DVT prophylaxis:  apixaban (ELIQUIS) tablet 5 mg   Code Status: DNR with limited intervention Family Communication: Daughter updated over the phone Disposition Plan: Status is: Inpatient Remains inpatient appropriate because: Stroke workup     Consultants:  Neurology  Procedures:  None  Antimicrobials:  None     Objective: Vitals:   09/04/23 2319 09/05/23 0357 09/05/23 0731 09/05/23 1154  BP: 125/61 113/68 113/62 115/62  Pulse: 90 93 85 85  Resp: 20 16 17 17   Temp: 98.7 F (37.1 C) 97.7 F (36.5 C) 97.6 F (36.4 C) 97.6 F (36.4 C)  TempSrc: Oral Oral Oral Oral  SpO2: 95% 95% 96% 97%  Weight:      Height:       No intake or output data in the 24 hours ending 09/05/23 1158 Filed Weights   09/04/23 1633  Weight: 83.2 kg    Examination:  General exam: Appears calm and comfortable  Frail and debility.  Chronically sick looking. Respiratory system: Clear to auscultation.  No added sounds. Cardiovascular system:  S1 & S2 heard, RRR.  Gastrointestinal system: Soft and nontender.  Bowel sound present. Central nervous system: Alert and oriented.  Patient has mild left facial droop. Left upper and lower extremity with diminished power 2/5. Asymmetrical swelling but nontender left leg. Psychiatry: Judgement and insight appear normal. Mood & affect appropriate.     Data Reviewed: I have personally reviewed following labs and imaging  studies  CBC: Recent Labs  Lab 09/03/23 1506 09/03/23 1512 09/04/23 0412  WBC 8.0  --  13.4*  NEUTROABS 5.3  --  11.4*  HGB 13.1 13.9 13.9  HCT 42.0 41.0 44.1  MCV 95.7  --  93.0  PLT 268  --  302   Basic Metabolic Panel: Recent Labs  Lab 09/03/23 1506 09/03/23 1512 09/04/23 0412  NA 138 137 139  K 4.7 4.6 4.5  CL 103 104 106  CO2 23  --  22  GLUCOSE 97 92 117*  BUN 20 23 18   CREATININE 0.95 0.90 0.94  CALCIUM 9.7  --  9.7  MG  --   --  2.3   GFR: Estimated Creatinine Clearance: 56.9 mL/min (by C-G formula based on SCr of 0.94 mg/dL). Liver Function Tests: Recent Labs  Lab 09/03/23 1506  AST 21  ALT 14  ALKPHOS 76  BILITOT 0.6  PROT 6.8  ALBUMIN 3.6   No results for input(s): "LIPASE", "AMYLASE" in the last 168 hours. No results for input(s): "AMMONIA" in the last 168 hours. Coagulation Profile: Recent Labs  Lab 09/03/23 1506  INR 1.1   Cardiac Enzymes: No results for input(s): "CKTOTAL", "CKMB", "CKMBINDEX", "TROPONINI" in the last 168 hours. BNP (last 3 results) No results for input(s): "PROBNP" in the last 8760 hours. HbA1C: Recent Labs    09/04/23 0412  HGBA1C 5.0   CBG: No results for input(s): "GLUCAP" in the last 168 hours. Lipid Profile: Recent Labs    09/04/23 0412  CHOL 252*  HDL 63  LDLCALC 168*  TRIG 103  CHOLHDL 4.0   Thyroid Function Tests: No results for input(s): "TSH", "T4TOTAL", "FREET4", "T3FREE", "THYROIDAB" in the last 72 hours. Anemia Panel: No results for input(s): "VITAMINB12", "FOLATE", "FERRITIN", "TIBC", "IRON", "RETICCTPCT" in the last 72 hours. Sepsis Labs: No results for input(s): "PROCALCITON", "LATICACIDVEN" in the last 168 hours.  No results found for this or any previous visit (from the past 240 hours).       Radiology Studies: ECHOCARDIOGRAM COMPLETE Result Date: 09/04/2023    ECHOCARDIOGRAM REPORT   Patient Name:   Valerie [name] [facility] Date of Exam: 09/04/2023 Medical Rec #:  [id number]            Height:       68.0 in Accession #:    [id number]          Weight:       184.0 lb Date of Birth:  December 13, 1945          BSA:          1.973 m Patient Age:    77 years            BP:           100/59 mmHg Patient Gender: F                   HR:           92 bpm. Exam Location:  Inpatient Procedure: 2D Echo, Color Doppler and Cardiac Doppler Indications:    TIA  History:  Patient has prior history of Echocardiogram examinations, most                 recent 03/28/2020.  Sonographer:    Irving Burton Senior RDCS Referring Phys: [phone] DEBBY CROSLEY  Sonographer Comments: Technically difficult study due to prior lung surgery, heart is very high and rotated IMPRESSIONS  1. Left ventricular ejection fraction, by estimation, is 25 to 30%. The left ventricle has severely decreased function. The left ventricle demonstrates global hypokinesis. Left ventricular diastolic parameters are consistent with Grade II diastolic dysfunction (pseudonormalization). Elevated left atrial pressure.  2. Right ventricular systolic function is normal. The right ventricular size is normal. There is normal pulmonary artery systolic pressure. The estimated right ventricular systolic pressure is 18.7 mmHg.  3. Atrial septum not well visualized on this study, PFO noted on prior TEE.  4. The mitral valve is normal in structure. Trivial mitral valve regurgitation.  5. The aortic valve was not well visualized. Aortic valve regurgitation is mild to moderate. Aortic valve sclerosis/calcification is present, without any evidence of aortic stenosis.  6. The inferior vena cava is normal in size with greater than 50% respiratory variability, suggesting right atrial pressure of 3 mmHg. FINDINGS  Left Ventricle: Left ventricular ejection fraction, by estimation, is 25 to 30%. The left ventricle has severely decreased function. The left ventricle demonstrates global hypokinesis. The left ventricular internal cavity size was normal in size. There is no left ventricular  hypertrophy. Left ventricular diastolic parameters are consistent with Grade II diastolic dysfunction (pseudonormalization). Elevated left atrial pressure. Right Ventricle: The right ventricular size is normal. Right vetricular wall thickness was not well visualized. Right ventricular systolic function is normal. There is normal pulmonary artery systolic pressure. The tricuspid regurgitant velocity is 1.98 m/s, and with an assumed right atrial pressure of 3 mmHg, the estimated right ventricular systolic pressure is 18.7 mmHg. Left Atrium: Left atrial size was normal in size. Right Atrium: Right atrial size was normal in size. Pericardium: There is no evidence of pericardial effusion. Mitral Valve: The mitral valve is normal in structure. There is mild thickening of the mitral valve leaflet(s). There is mild calcification of the mitral valve leaflet(s). Trivial mitral valve regurgitation. Tricuspid Valve: The tricuspid valve is normal in structure. Tricuspid valve regurgitation is trivial. Aortic Valve: The aortic valve was not well visualized. Aortic valve regurgitation is mild to moderate. Aortic valve sclerosis/calcification is present, without any evidence of aortic stenosis. Pulmonic Valve: The pulmonic valve was grossly normal. Pulmonic valve regurgitation is trivial. Aorta: The ascending aorta was not well visualized and the aortic root is normal in size and structure. Venous: The inferior vena cava is normal in size with greater than 50% respiratory variability, suggesting right atrial pressure of 3 mmHg. IAS/Shunts: The interatrial septum was not well visualized. A small patent foramen ovale is detected.  LEFT VENTRICLE PLAX 2D LVIDd:         4.30 cm     Diastology LVIDs:         3.80 cm     LV e' medial:    4.57 cm/s LV PW:         0.80 cm     LV E/e' medial:  21.6 LV IVS:        0.80 cm     LV e' lateral:   4.57 cm/s LVOT diam:     1.90 cm     LV E/e' lateral: 21.6 LV SV:  44 LV SV Index:   22 LVOT  Area:     2.84 cm  LV Volumes (MOD) LV vol d, MOD A4C: 94.5 ml LV vol s, MOD A4C: 67.5 ml LV SV MOD A4C:     94.5 ml RIGHT VENTRICLE TAPSE (M-mode): 1.8 cm LEFT ATRIUM             Index        RIGHT ATRIUM           Index LA diam:        2.20 cm 1.12 cm/m   RA Area:     14.30 cm LA Vol (A2C):   38.7 ml 19.62 ml/m  RA Volume:   34.70 ml  17.59 ml/m LA Vol (A4C):   10.9 ml 5.53 ml/m LA Biplane Vol: 20.9 ml 10.60 ml/m  AORTIC VALVE LVOT Vmax:   100.00 cm/s LVOT Vmean:  71.800 cm/s LVOT VTI:    0.156 m  AORTA Ao Root diam: 3.20 cm MITRAL VALVE                TRICUSPID VALVE MV Area (PHT): 3.91 cm     TR Peak grad:   15.7 mmHg MV Decel Time: 194 msec     TR Vmax:        198.00 cm/s MV E velocity: 98.50 cm/s MV A velocity: 121.00 cm/s  SHUNTS MV E/A ratio:  0.81         Systemic VTI:  0.16 m                             Systemic Diam: 1.90 cm Epifanio Lesches MD Electronically signed by Epifanio Lesches MD Signature Date/Time: 09/04/2023/3:06:37 PM    Final    CT ANGIO HEAD NECK W WO CM Result Date: 09/04/2023 CLINICAL DATA:  Acute neurologic defect. EXAM: CT ANGIOGRAPHY HEAD AND NECK WITH AND WITHOUT CONTRAST TECHNIQUE: Multidetector CT imaging of the head and neck was performed using the standard protocol during bolus administration of intravenous contrast. Multiplanar CT image reconstructions and MIPs were obtained to evaluate the vascular anatomy. Carotid stenosis measurements (when applicable) are obtained utilizing NASCET criteria, using the distal internal carotid diameter as the denominator. RADIATION DOSE REDUCTION: This exam was performed according to the departmental dose-optimization program which includes automated exposure control, adjustment of the mA and/or kV according to patient size and/or use of iterative reconstruction technique. CONTRAST:  75mL OMNIPAQUE IOHEXOL 350 MG/ML SOLN COMPARISON:  Brain MRI from yesterday. CTA of the head neck 10/02/2021 FINDINGS: CT HEAD FINDINGS  Noncontrast head CT is motion degraded with most images being nondiagnostic known acute white matter infarct with no evidence of superimposed hemorrhage. Dystrophic cortical linear calcification in the bilateral occipital lobes, greater on the left beneath the craniotomy site. CTA NECK FINDINGS Aortic arch: 3 vessel branching and atherosclerosis. Right carotid system: Atheromatous calcification at the bifurcation. No stenosis or ulceration. Left carotid system: Mild atheromatous plaque at the bifurcation. No stenosis or ulceration. Vertebral arteries: Subclavian atherosclerosis without significant stenosis. The vertebral arteries are smoothly contoured and diffusely patent. Skeleton: No acute finding Other neck: No acute finding Upper chest: Partially covered layering pleural effusions and right upper lobe airway opacification stable from 03/01/2023. Known history of lung cancer. Review of the MIP images confirms the above findings CTA HEAD FINDINGS Anterior circulation: Mild atheromatous calcification of the cavernous carotids. No major branch occlusion, beading, or aneurysm. Posterior circulation: Small vertebral and basilar  arteries in the setting of fetal type bilateral PCA flow. Segmental narrowing of the mid and distal basilar with high-grade stenoses at the upper and lower margins of this stenosis, progressed, see 16:63. Venous sinuses: Unremarkable for arterial timing. Review of the MIP images confirms the above findings IMPRESSION: 1. No acute arterial finding to correlate with the acute infarct in the right cerebral white matter. 2. Progressive and high-grade narrowing of the basilar compared to 10/01/2021, presumably atheromatous. There is fetal type bilateral PCA flow. 3. The noncontrast head CT portion of the study is nondiagnostic due to motion. 4. Airway thickening and opacification in the right upper lobe also noted on most recent staging chest CT, attention on follow-up. Electronically Signed   By:  Tiburcio Pea M.D.   On: 09/04/2023 05:24   MR BRAIN WO CONTRAST Addendum Date: 09/03/2023 ADDENDUM REPORT: 09/03/2023 22:53 ADDENDUM: There was a synchronization error that caused the wrong report to be associated with this scan. The below is a corrected report that corresponds to the scan for [facility]. Brain: There is a small acute infarct at the tail of the right caudate. Fewer than 5 scattered microhemorrhages in a nonspecific pattern. There is multifocal hyperintense T2-weighted signal within the white matter. Generalized volume loss. The midline structures are normal. Vascular: Normal flow voids. Skull and upper cervical spine: Normal calvarium and skull base. Visualized upper cervical spine and soft tissues are normal. Sinuses/Orbits:No paranasal sinus fluid levels or advanced mucosal thickening. No mastoid or middle ear effusion. Normal orbits. IMPRESSION: 1. A software synchronization error caused an incorrect report to be associated with this examination. Please disregard the initial report. 2. There is a small acute infarct of the right caudate nucleus. No acute hemorrhage. 3. Chronic ischemic microangiopathy. These results were called by telephone at the time of interpretation on 09/03/2023 at 10:51 pm to provider [facility] , who verbally acknowledged these results. Electronically Signed   By: Deatra Robinson M.D.   On: 09/03/2023 22:53   Result Date: 09/03/2023 CLINICAL DATA:  Acute neurologic deficit EXAM: MRI HEAD WITHOUT CONTRAST TECHNIQUE: Multiplanar, multiecho pulse sequences of the brain and surrounding structures were obtained without intravenous contrast. COMPARISON:  10/01/2021 FINDINGS: Brain: There is DWI hyperintensity throughout much of the left MCA territory, subjacent to a large area of encephalomalacia. No corresponding ADC deficit. Hemosiderin deposition over the left MCA encephalomalacia territory. Multifocal chronic microhemorrhage in a nonspecific pattern. There  is multifocal hyperintense T2-weighted signal within the white matter. Parenchymal volume and CSF spaces are normal. 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 The midline structures are normal. Vascular: Normal flow voids. Skull and upper cervical spine: Normal calvarium and skull base. Visualized upper cervical spine and soft tissues are normal. Sinuses/Orbits:No paranasal sinus fluid levels or advanced mucosal thickening. No mastoid or middle ear effusion. Normal orbits. IMPRESSION: 1. DWI hyperintensity throughout much of the left MCA territory, subjacent to a large area of encephalomalacia. This may indicate an area of subacute ischemia versus T2 shine through related to the old left MCA infarct. 2. 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 Electronically Signed: By: Deatra Robinson M.D. On: 09/03/2023 22:18   CT HEAD WO CONTRAST Result Date: 09/03/2023 CLINICAL DATA:  Weakness, stroke suspected EXAM: CT HEAD WITHOUT CONTRAST TECHNIQUE: Contiguous axial images were obtained from the base of the skull through the vertex without intravenous contrast. RADIATION DOSE REDUCTION: This exam was performed according to the departmental dose-optimization program which includes automated exposure control, adjustment of  the mA and/or kV according to patient size and/or use of iterative reconstruction technique. COMPARISON:  10/01/2021 CT head FINDINGS: Brain: No evidence of acute infarction, hemorrhage, mass, mass effect, or midline shift. No hydrocephalus or extra-axial fluid collection. Redemonstrated cerebellar atrophy. Redemonstrated encephalomalacia with laminar necrosis and calcifications in the bilateral occipital lobes. Periventricular white matter changes, likely the sequela of chronic small vessel ischemic disease. More focal hypodensity in the right posterior lentiform nucleus and corona radiata is new from the prior exam and favored to represent a remote lacunar infarct.  Vascular: No hyperdense vessel. Skull: Prior left parietooccipital craniotomy. Negative for fracture or focal lesion. Sinuses/Orbits: No acute finding. Other: The mastoid air cells are well aerated. IMPRESSION: 1. No acute intracranial process. 2. More focal hypodensity in the right posterior lentiform nucleus and corona radiata is new from the prior exam and favored to represent a remote lacunar infarct. If there is concern for an acute infarct, consider MRI for further evaluation. Electronically Signed   By: Wiliam Ke M.D.   On: 09/03/2023 17:12        Scheduled Meds:  apixaban  5 mg Oral BID   polyethylene glycol  17 g Oral Daily   rosuvastatin  20 mg Oral Daily   sodium chloride flush  3 mL Intravenous Once   Continuous Infusions:   LOS: 1 day    Time spent: 52 minutes    Dorcas Carrow, MD [facility]

## 2023-09-05 NOTE — Plan of Care (Signed)
  Problem: SLP Language Goals Goal: Patient will utilize speech intelligibility Description: Patient will utilize speech intelligibility strategies to  enhance communication with Flowsheets (Taken 09/05/2023 0947) Patient will utilize speech intelligibility strategies to enhance communication with ____: modified independence Goal: Misc Speech Goal #1 Flowsheets (Taken 09/05/2023 0947) Misc Speech goal #1: Pt will utilize word-finding strategies to reduce communication breakdowns with min assistance.

## 2023-09-05 NOTE — TOC Initial Note (Signed)
Transition of Care [facility]) - Initial/Assessment Note    Patient Details  Name: Valerie [name] MRN: [id number] Date of Birth: [birth year]  Transition of Care [facility]) CM/SW Contact:    Mette Southgate Felipa Emory, Student-Social Work Phone Number: 09/05/2023, 1:15 PM  Clinical Narrative: Patient is a long term care resident at [facility] and can return when medically stable.              Expected Discharge Plan: Skilled Nursing Facility Barriers to Discharge: Continued Medical Work up   Patient Goals and CMS Choice Patient states their goals for this hospitalization and ongoing recovery are:: Patient unable to particiapte in goal setting, not fully oreinted          Expected Discharge Plan and Services     Post Acute Care Choice: Skilled Nursing Facility Living arrangements for the past 2 months: Skilled Nursing Facility                                      Prior Living Arrangements/Services Living arrangements for the past 2 months: Skilled Nursing Facility Lives with:: Facility Resident Patient language and need for interpreter reviewed:: No Do you feel safe going back to the place where you live?: Yes      Need for Family Participation in Patient Care: Yes (Comment) Care giver support system in place?: Yes (comment)   Criminal Activity/Legal Involvement Pertinent to Current Situation/Hospitalization: No - Comment as needed  Activities of Daily Living      Permission Sought/Granted Permission sought to share information with : Facility Medical sales representative, Family Supports    Share Information with NAME: Erie Noe  Permission granted to share info w AGENCY: [facility]  Permission granted to share info w Relationship: Daughters     Emotional Assessment   Attitude/Demeanor/Rapport: Unable to Assess Affect (typically observed): Unable to Assess Orientation: : Oriented to Self Alcohol / Substance Use: Not Applicable Psych Involvement: No  (comment)  Admission diagnosis:  Acute CVA (cerebrovascular accident) (HCC) [I63.9] Cerebrovascular accident (CVA), unspecified mechanism (HCC) [I63.9] Patient Active Problem List   Diagnosis Date Noted   Acute CVA (cerebrovascular accident) (HCC) 09/04/2023   Dysarthria 10/01/2021   Acute metabolic encephalopathy 10/01/2021   UTI (urinary tract infection) 10/01/2021   COVID-19 virus infection 10/01/2021   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   PAF (paroxysmal atrial fibrillation) (HCC) 07/07/2021   History of gross hematuria 07/07/2021   Atherosclerosis of aorta (HCC) 05/11/2021   Complicated UTI (urinary tract infection) 04/17/2021   Malnutrition of moderate degree 06/13/2020   Rash and nonspecific skin eruption    Metastatic malignant neoplasm (HCC)    Failure to thrive in adult    Asymptomatic bacteriuria    Pressure injury of skin 06/03/2020   Cellulitis 06/03/2020   SVT (supraventricular tachycardia) (HCC) 03/26/2020   Normocytic anemia 03/24/2020   Urinary tract infection without hematuria    AMS (altered mental status) 03/23/2020   Hypokalemia 03/23/2020   Prolonged Q-T interval on ECG 03/23/2020   DNR (do not resuscitate) 03/23/2020   MRSA bacteremia 03/23/2020   Acute combined systolic and diastolic heart failure (HCC)    Elevated troponin    Dilated cardiomyopathy (HCC)    Nonocclusive coronary atherosclerosis of native coronary artery    Congestive heart failure (CHF) (HCC) 02/14/2020   Pyelonephritis 09/10/2019   Acute blood loss anemia 06/12/2019   Closed fracture of distal end of right  femur, initial encounter (HCC) 06/11/2019   Closed fracture of lateral portion of right tibial plateau 06/11/2019   History of DVT of lower extremity 06/11/2019   Blurry vision 01/06/2019   Headache 07/24/2016   Cough 08/24/2015   Chronic diarrhea 08/24/2015   Sepsis (HCC)    Encounter for antineoplastic chemotherapy 01/31/2015   Urinary retention 10/20/2014   Ileus  (HCC) 10/20/2014   Cellulitis of leg, right 10/20/2014   Fracture of right proximal fibula    Ankle fracture 10/02/2014   Ankle fracture 10/02/2014   Lung cancer (HCC) 10/02/2014   Closed fibular fracture 10/02/2014   Dementia (HCC) 10/02/2014   Lower urinary tract infectious disease 10/02/2014   Reactive airway disease 10/02/2014   Fall 10/02/2014   Closed left ankle fracture 10/01/2014   Right fibular fracture 10/01/2014   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 [place] [address] Phone: [phone] Fax: [phone]     Social Drivers of Health (SDOH) Social History: SDOH Screenings   Food Insecurity: No Food Insecurity (09/04/2023)  Housing: Unknown (09/04/2023)  Transportation Needs: No Transportation Needs (09/04/2023)  Utilities: Not At Risk (09/04/2023)  Depression (PHQ2-9): Low Risk  (09/14/2020)  Financial Resource Strain: Low Risk  (09/14/2020)  Physical Activity: Inactive (09/14/2020)  Stress: No Stress Concern Present (09/14/2020)  Tobacco Use: Low Risk  (09/03/2023)   SDOH Interventions:     Readmission Risk Interventions     No data to display

## 2023-09-06 ENCOUNTER — Inpatient Hospital Stay (HOSPITAL_COMMUNITY): Payer: Medicare PPO

## 2023-09-06 DIAGNOSIS — I639 Cerebral infarction, unspecified: Secondary | ICD-10-CM | POA: Diagnosis not present

## 2023-09-06 DIAGNOSIS — M7989 Other specified soft tissue disorders: Secondary | ICD-10-CM | POA: Diagnosis not present

## 2023-09-06 LAB — GLUCOSE, CAPILLARY: Glucose-Capillary: 100 mg/dL — ABNORMAL HIGH (ref 70–99)

## 2023-09-06 NOTE — NC FL2 (Addendum)
[place] MEDICAID FL2 LEVEL OF CARE FORM     IDENTIFICATION  Patient Name: Valerie [name] Birthdate: Jun 27, 1946 Sex: female Admission Date (Current Location): 09/03/2023  [facility] and [place] Number:  Producer, television/film/video and Address:  [facility], [address], [place], [place] [address]      Provider Number: [id number]  Attending Physician Name and Address:  Dorcas Carrow, MD  Relative Name and Phone Number:       Current Level of Care: SNF Recommended Level of Care: Skilled Nursing Facility Prior Approval Number:    Date Approved/Denied:   PASRR Number: [id number]  Discharge Plan: SNF    Current Diagnoses: Patient Active Problem List   Diagnosis Date Noted   Acute CVA (cerebrovascular accident) (HCC) 09/04/2023   Dysarthria 10/01/2021   Acute metabolic encephalopathy 10/01/2021   UTI (urinary tract infection) 10/01/2021   COVID-19 virus infection 10/01/2021   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   PAF (paroxysmal atrial fibrillation) (HCC) 07/07/2021   History of gross hematuria 07/07/2021   Atherosclerosis of aorta (HCC) 05/11/2021   Complicated UTI (urinary tract infection) 04/17/2021   Malnutrition of moderate degree 06/13/2020   Rash and nonspecific skin eruption    Metastatic malignant neoplasm (HCC)    Failure to thrive in adult    Asymptomatic bacteriuria    Pressure injury of skin 06/03/2020   Cellulitis 06/03/2020   SVT (supraventricular tachycardia) (HCC) 03/26/2020   Normocytic anemia 03/24/2020   Urinary tract infection without hematuria    AMS (altered mental status) 03/23/2020   Hypokalemia 03/23/2020   Prolonged Q-T interval on ECG 03/23/2020   DNR (do not resuscitate) 03/23/2020   MRSA bacteremia 03/23/2020   Acute combined systolic and diastolic heart failure (HCC)    Elevated troponin    Dilated cardiomyopathy (HCC)    Nonocclusive coronary atherosclerosis of native coronary artery    Congestive  heart failure (CHF) (HCC) 02/14/2020   Pyelonephritis 09/10/2019   Acute blood loss anemia 06/12/2019   Closed fracture of distal end of right femur, initial encounter (HCC) 06/11/2019   Closed fracture of lateral portion of right tibial plateau 06/11/2019   History of DVT of lower extremity 06/11/2019   Blurry vision 01/06/2019   Headache 07/24/2016   Cough 08/24/2015   Chronic diarrhea 08/24/2015   Sepsis (HCC)    Encounter for antineoplastic chemotherapy 01/31/2015   Urinary retention 10/20/2014   Ileus (HCC) 10/20/2014   Cellulitis of leg, right 10/20/2014   Fracture of right proximal fibula    Ankle fracture 10/02/2014   Ankle fracture 10/02/2014   Lung cancer (HCC) 10/02/2014   Closed fibular fracture 10/02/2014   Dementia (HCC) 10/02/2014   Lower urinary tract infectious disease 10/02/2014   Reactive airway disease 10/02/2014   Fall 10/02/2014   Closed left ankle fracture 10/01/2014   Right fibular fracture 10/01/2014   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)    LESION, ANUS 04/26/2009   DIARRHEA 04/26/2009   Primary cancer of right upper lobe of lung (HCC) 04/22/2009   Malignant neoplasm of brain (HCC) 04/22/2009   VITAMIN D DEFICIENCY 04/22/2009   ANAL FISTULA 04/22/2009   OSTEOPOROSIS 04/22/2009    Orientation RESPIRATION BLADDER Height & Weight     Self, Situation, Place  Normal Incontinent Weight: 183 lb 6.8 oz (83.2 kg) Height:  6' (182.9 cm)  BEHAVIORAL SYMPTOMS/MOOD NEUROLOGICAL BOWEL NUTRITION STATUS      Incontinent Diet (Regular Fluid)  AMBULATORY STATUS COMMUNICATION OF NEEDS Skin  Extensive Assist Verbally Normal                       Personal Care Assistance Level of Assistance  Bathing, Feeding, Dressing Bathing Assistance: Limited assistance Feeding assistance: Limited assistance Dressing Assistance: Maximum assistance     Functional Limitations Info  Speech     Speech Info: Impaired    SPECIAL CARE FACTORS FREQUENCY  PT  (By licensed PT), OT (By licensed OT)     PT Frequency: 5x/week OT Frequency: 5x/week            Contractures Contractures Info: Not present    Additional Factors Info  Code Status, Allergies Code Status Info: DNR Allergies Info: Broccoli (Brassica Oleracea), Fruit Extracts, Sulfa Antibiotics           Current Medications (09/06/2023):  This is the current hospital active medication list Current Facility-Administered Medications  Medication Dose Route Frequency Provider Last Rate Last Admin   acetaminophen (TYLENOL) tablet 650 mg  650 mg Oral Q6H PRN Crosley, Debby, MD       Or   acetaminophen (TYLENOL) suppository 650 mg  650 mg Rectal Q6H PRN Crosley, Debby, MD       apixaban (ELIQUIS) tablet 5 mg  5 mg Oral BID Dorcas Carrow, MD   5 mg at 09/06/23 0900   cyanocobalamin (VITAMIN B12) tablet 1,000 mcg  1,000 mcg Oral QPM Dorcas Carrow, MD   1,000 mcg at 09/05/23 1713   donepezil (ARICEPT) tablet 10 mg  10 mg Oral QPM Dorcas Carrow, MD   10 mg at 09/05/23 1713   magnesium oxide (MAG-OX) tablet 400 mg  400 mg Oral Daily Dorcas Carrow, MD   400 mg at 09/06/23 0901   metoprolol tartrate (LOPRESSOR) injection 5 mg  5 mg Intravenous Q4H PRN Crosley, Debby, MD       mirtazapine (REMERON) tablet 7.5 mg  7.5 mg Oral QHS Dorcas Carrow, MD   7.5 mg at 09/05/23 2115   multivitamin with minerals tablet 1 tablet  1 tablet Oral QPM Dorcas Carrow, MD   1 tablet at 09/05/23 1713   nitrofurantoin (macrocrystal-monohydrate) (MACROBID) capsule 100 mg  100 mg Oral QHS Dorcas Carrow, MD   100 mg at 09/05/23 2115   polyethylene glycol (MIRALAX / GLYCOLAX) packet 17 g  17 g Oral Daily Dorcas Carrow, MD   17 g at 09/06/23 0901   rosuvastatin (CRESTOR) tablet 20 mg  20 mg Oral Daily de Saintclair Halsted, Cortney E, NP   20 mg at 09/06/23 0901   saccharomyces boulardii (FLORASTOR) capsule 250 mg  250 mg Oral BID Dorcas Carrow, MD   250 mg at 09/06/23 0901   senna-docusate (Senokot-S) tablet 1 tablet  1  tablet Oral QHS PRN Gery Pray, MD       senna-docusate (Senokot-S) tablet 1 tablet  1 tablet Oral Daily Dorcas Carrow, MD   1 tablet at 09/06/23 0901   sodium chloride flush (NS) 0.9 % injection 3 mL  3 mL Intravenous Once Ernie Avena, MD         Discharge Medications: Please see discharge summary for a list of discharge medications.  Relevant Imaging Results:  Relevant Lab Results:   Additional Information SSN: [id number]  Antion Felipa Emory, Student-Social Work

## 2023-09-06 NOTE — Progress Notes (Signed)
Lower extremity venous duplex completed. Please see CV Procedures for preliminary results.  Shona Simpson, RVT 09/06/23 4:41 PM

## 2023-09-06 NOTE — Progress Notes (Signed)
PROGRESS NOTE    Valerie [name]  ZOX:[id number] DOB: 10-31-1945 DOA: 09/03/2023 PCP: Pcp, No    Brief Narrative:  78 year old with history of lung cancer with brain mets, osteoporosis, long-term nursing home resident and currently mostly wheelchair-bound presented with about 2 to 3 days of left-sided weakness.  There is possible history of A-fib however not documented by EKG.  She was usually able to get around after help to the wheelchair but last 3 days she was not able to do that.  Found to have acute stroke right caudate nuclei and admitted to the hospital.  Subjective:  Patient seen and examined.  No overnight events.  Alert awake and interactive.  Discussed with her about pending ultrasound and possible discharge on blood thinners.   Assessment & Plan:   Acute ischemic infarct right caudate tail.  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 Clinical findings, acute onset left-sided weakness and drooling for 3 days. CT head findings, no acute findings.  Focal hypodensity right posterior lentiform nucleus and corona radiata. MRI of the brain, small acute infarct right caudate nucleus. CT angiogram of the head and neck, no large vessel occlusion.  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 2D echocardiogram, ejection fraction 25 to 30%.  Left atrium size is normal.  Global hypokinesis.  Similar to previous echocardiogram since last 3 years. Antiplatelet therapy, none at home. LDL 168. Hemoglobin A1c, 5. Patient not on any antithrombotic before coming to the hospital.  She does have reported history of A-fib, ejection fraction 25% and current symptomatology consistent with embolic stroke.  No clear evidence of complications with anticoagulation.  Discussed with patient and family and started on Eliquis 5 mg twice daily.  Stroke prevention. Suspect DVT of the left lower extremity.  Will check duplexes. Continue to work with PT  OT.  Referred back to nursing home with inpatient therapies.  History of  A-fib: Patient currently with sinus rhythm and multiple PACs.  Rate controlled.  Could not find any recorded evidence of A-fib.  Hyperlipidemia: LDL 168.  Added Crestor 20 mg daily.  Chronic systolic congestive heart failure: Euvolemic.  Asymmetrical swelling of the left leg: Rule out DVT.  Duplex is pending.  Medically stabilizing to transfer back to SNF.  Duplex is pending.    DVT prophylaxis:  apixaban (ELIQUIS) tablet 5 mg   Code Status: DNR with limited intervention Family Communication: None today. Disposition Plan: Status is: Inpatient Remains inpatient appropriate because: Stroke workup, vascular workup pending.     Consultants:  Neurology  Procedures:  None  Antimicrobials:  None     Objective: Vitals:   09/05/23 2313 09/06/23 0351 09/06/23 0805 09/06/23 1246  BP: (!) 111/55 (!) 110/55 (!) 118/59 111/63  Pulse: 98 84 81 84  Resp: 16 16 18 18   Temp: 98.2 F (36.8 C) 97.8 F (36.6 C) 98 F (36.7 C) (!) 97.3 F (36.3 C)  TempSrc: Oral Oral Oral Oral  SpO2: 95% 96% 96% 99%  Weight:      Height:        Intake/Output Summary (Last 24 hours) at 09/06/2023 1435 Last data filed at 09/06/2023 0351 Gross per 24 hour  Intake --  Output 700 ml  Net -700 ml   Filed Weights   09/04/23 1633  Weight: 83.2 kg    Examination:  General exam: Appears calm and comfortable.  Pleasant interaction.  Frail. Respiratory system: Clear to auscultation.  No added sounds. Cardiovascular system: S1 &  S2 heard, RRR.  Gastrointestinal system: Soft and nontender.  Bowel sounds present. Central nervous system: Alert and oriented.  Patient has mild left facial droop. Left upper and lower extremity with diminished power 2/5. Asymmetrical swelling but nontender left leg. Psychiatry: Judgement and insight appear normal. Mood & affect appropriate.     Data Reviewed: I have personally reviewed  following labs and imaging studies  CBC: Recent Labs  Lab 09/03/23 1506 09/03/23 1512 09/04/23 0412  WBC 8.0  --  13.4*  NEUTROABS 5.3  --  11.4*  HGB 13.1 13.9 13.9  HCT 42.0 41.0 44.1  MCV 95.7  --  93.0  PLT 268  --  302   Basic Metabolic Panel: Recent Labs  Lab 09/03/23 1506 09/03/23 1512 09/04/23 0412  NA 138 137 139  K 4.7 4.6 4.5  CL 103 104 106  CO2 23  --  22  GLUCOSE 97 92 117*  BUN 20 23 18   CREATININE 0.95 0.90 0.94  CALCIUM 9.7  --  9.7  MG  --   --  2.3   GFR: Estimated Creatinine Clearance: 56.9 mL/min (by C-G formula based on SCr of 0.94 mg/dL). Liver Function Tests: Recent Labs  Lab 09/03/23 1506  AST 21  ALT 14  ALKPHOS 76  BILITOT 0.6  PROT 6.8  ALBUMIN 3.6   No results for input(s): "LIPASE", "AMYLASE" in the last 168 hours. No results for input(s): "AMMONIA" in the last 168 hours. Coagulation Profile: Recent Labs  Lab 09/03/23 1506  INR 1.1   Cardiac Enzymes: No results for input(s): "CKTOTAL", "CKMB", "CKMBINDEX", "TROPONINI" in the last 168 hours. BNP (last 3 results) No results for input(s): "PROBNP" in the last 8760 hours. HbA1C: Recent Labs    09/04/23 0412  HGBA1C 5.0   CBG: No results for input(s): "GLUCAP" in the last 168 hours. Lipid Profile: Recent Labs    09/04/23 0412  CHOL 252*  HDL 63  LDLCALC 168*  TRIG 103  CHOLHDL 4.0   Thyroid Function Tests: No results for input(s): "TSH", "T4TOTAL", "FREET4", "T3FREE", "THYROIDAB" in the last 72 hours. Anemia Panel: No results for input(s): "VITAMINB12", "FOLATE", "FERRITIN", "TIBC", "IRON", "RETICCTPCT" in the last 72 hours. Sepsis Labs: No results for input(s): "PROCALCITON", "LATICACIDVEN" in the last 168 hours.  Recent Results (from the past 240 hours)  MRSA Next Gen by PCR, Nasal     Status: None   Collection Time: 09/05/23 10:40 AM   Specimen: Nasal Mucosa; Nasal Swab  Result Value Ref Range Status   MRSA by PCR Next Gen NOT DETECTED NOT DETECTED Final     Comment: (NOTE) The GeneXpert MRSA Assay (FDA approved for NASAL specimens only), is one component of a comprehensive MRSA colonization surveillance program. It is not intended to diagnose MRSA infection nor to guide or monitor treatment for MRSA infections. Test performance is not FDA approved in patients less than 64 years old. Performed at [facility], [address] [place], [place] [address]          Radiology Studies: No results found.       Scheduled Meds:  apixaban  5 mg Oral BID   cyanocobalamin  1,000 mcg Oral QPM   donepezil  10 mg Oral QPM   magnesium oxide  400 mg Oral Daily   mirtazapine  7.5 mg Oral QHS   multivitamin with minerals  1 tablet Oral QPM   nitrofurantoin (macrocrystal-monohydrate)  100 mg Oral QHS   polyethylene glycol  17  g Oral Daily   rosuvastatin  20 mg Oral Daily   saccharomyces boulardii  250 mg Oral BID   senna-docusate  1 tablet Oral Daily   sodium chloride flush  3 mL Intravenous Once   Continuous Infusions:   LOS: 2 days    Time spent: 40 minutes    Dorcas Carrow, MD [facility]

## 2023-09-06 NOTE — Plan of Care (Signed)
  Problem: Education: Goal: Knowledge of disease or condition will improve Outcome: Progressing Goal: Knowledge of secondary prevention will improve (MUST DOCUMENT ALL) Outcome: Progressing Goal: Knowledge of patient specific risk factors will improve Loraine Leriche N/A or DELETE if not current risk factor) Outcome: Progressing   Problem: Ischemic Stroke/TIA Tissue Perfusion: Goal: Complications of ischemic stroke/TIA will be minimized Outcome: Progressing   Problem: Coping: Goal: Will verbalize positive feelings about self Outcome: Progressing Goal: Will identify appropriate support needs Outcome: Progressing   Problem: Health Behavior/Discharge Planning: Goal: Ability to manage health-related needs will improve Outcome: Progressing Goal: Goals will be collaboratively established with patient/family Outcome: Progressing   Problem: Self-Care: Goal: Ability to participate in self-care as condition permits will improve Outcome: Progressing Goal: Verbalization of feelings and concerns over difficulty with self-care will improve Outcome: Progressing Goal: Ability to communicate needs accurately will improve Outcome: Progressing   Problem: Nutrition: Goal: Risk of aspiration will decrease Outcome: Progressing Goal: Dietary intake will improve Outcome: Progressing   Problem: Education: Goal: Knowledge of General Education information will improve Description: Including pain rating scale, medication(s)/side effects and non-pharmacologic comfort measures Outcome: Progressing   Problem: Health Behavior/Discharge Planning: Goal: Ability to manage health-related needs will improve Outcome: Progressing   Problem: Clinical Measurements: Goal: Ability to maintain clinical measurements within normal limits will improve Outcome: Progressing Goal: Will remain free from infection Outcome: Progressing Goal: Diagnostic test results will improve Outcome: Progressing Goal: Respiratory  complications will improve Outcome: Progressing Goal: Cardiovascular complication will be avoided Outcome: Progressing   Problem: Activity: Goal: Risk for activity intolerance will decrease Outcome: Progressing   Problem: Nutrition: Goal: Adequate nutrition will be maintained Outcome: Progressing   Problem: Coping: Goal: Level of anxiety will decrease Outcome: Progressing   Problem: Elimination: Goal: Will not experience complications related to bowel motility Outcome: Progressing Goal: Will not experience complications related to urinary retention Outcome: Progressing   Problem: Pain Managment: Goal: General experience of comfort will improve and/or be controlled Outcome: Progressing   Problem: Safety: Goal: Ability to remain free from injury will improve Outcome: Progressing   Problem: Skin Integrity: Goal: Risk for impaired skin integrity will decrease Outcome: Progressing

## 2023-09-07 DIAGNOSIS — I639 Cerebral infarction, unspecified: Secondary | ICD-10-CM | POA: Diagnosis not present

## 2023-09-07 MED ORDER — ROSUVASTATIN CALCIUM 20 MG PO TABS: 20.0000 mg | ORAL_TABLET | Freq: Every day | ORAL | Status: AC

## 2023-09-07 MED ORDER — APIXABAN 5 MG PO TABS: 5.0000 mg | ORAL_TABLET | Freq: Two times a day (BID) | ORAL | Status: AC

## 2023-09-07 NOTE — Progress Notes (Signed)
Called RN at [employer] place to give report. Pt's glasses, shoes and clothing/pillow from SNF facility was packed by this RN and given to PTAR. Rolled off unit by PTAR.

## 2023-09-07 NOTE — Discharge Summary (Signed)
Physician Discharge Summary  Valerie [name] GMW:[id number] DOB: 06-01-1946 DOA: 09/03/2023  PCP: Pcp, No  Admit date: 09/03/2023 Discharge date: 09/07/2023  Admitted From: Long-term nursing home Disposition: Skilled nursing rehab  Recommendations for Outpatient Follow-up:  Follow up with PCP in 1-2 weeks at a skilled nursing rehab Please obtain BMP/CBC in one week   Discharge Condition: Fair CODE STATUS: DNR with limited intervention Diet recommendation: Regular diet, nutritional supplements  Discharge summary: 78 year old with history of lung cancer with brain mets, osteoporosis, long-term nursing home resident and currently mostly wheelchair-bound presented with about 2 to 3 days of left-sided weakness.  There is possible history of A-fib however not documented by EKG.  She was usually able to get around after help to the wheelchair but last 3 days she was not able to do that.  Found to have acute stroke right caudate nuclei and admitted to the hospital.  Patient was treated for following conditions.   Assessment & plan of care:   Acute ischemic infarct right caudate tail.  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 Clinical findings, acute onset left-sided weakness and drooling for 3 days. CT head findings, no acute findings.  Focal hypodensity right posterior lentiform nucleus and corona radiata. MRI of the brain, small acute infarct right caudate nucleus. CT angiogram of the head and neck, no large vessel occlusion.  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 2D echocardiogram, ejection fraction 25 to 30%.  Left atrium size is normal.  Global hypokinesis.  Similar to previous echocardiogram since last 3 years. Antiplatelet therapy, none at home. LDL 168. Hemoglobin A1c, 5. Patient not on any antithrombotic before coming to the hospital.  She does have reported history of A-fib, ejection fraction 25% and current  symptomatology consistent with embolic stroke.  No clear evidence of complications with anticoagulation.  Patient has history of cancer and that puts her into hypercoagulable state.  Discussed with patient and family and started on Eliquis 5 mg twice daily.  Stroke prevention. Continue to work with PT OT.  Referred back to nursing home with inpatient therapies.   History of  A-fib: Patient currently with sinus rhythm and multiple PACs.  Rate controlled.  Could not find any recorded evidence of A-fib.   Hyperlipidemia: LDL 168.  Added Crestor 20 mg daily.   Chronic systolic congestive heart failure: Euvolemic.   Asymmetrical swelling of the left leg: Improved.  Negative for DVT.   Medically stabilizing to transfer back to SNF.  Discharge Diagnoses:  Principal Problem:   Acute CVA (cerebrovascular accident) [facility]) Active Problems:   Malignant neoplasm of brain (HCC)   Dementia (HCC)   Prolonged Q-T interval on ECG   Metastatic malignant neoplasm (HCC)   Malnutrition of moderate degree    Discharge Instructions  Discharge Instructions     Diet general   Complete by: As directed    Increase activity slowly   Complete by: As directed       Allergies as of 09/07/2023       Reactions   Broccoli [brassica Oleracea] Other (See Comments)   Fruit Extracts Other (See Comments)   unknown   Sulfa Antibiotics Other (See Comments)   She cant remember  what happens        Medication List     TAKE these medications    acetaminophen 325 MG tablet Commonly known as: TYLENOL Take 650 mg by mouth every 8 (eight) hours as needed for mild pain (pain  score 1-3).   apixaban 5 MG Tabs tablet Commonly known as: ELIQUIS Take 1 tablet (5 mg total) by mouth 2 (two) times daily.   cyanocobalamin 1000 MCG tablet Commonly known as: VITAMIN B12 Take 1,000 mcg by mouth every evening.   donepezil 10 MG tablet Commonly known as: ARICEPT TAKE 1 TABLET BY MOUTH ONCE DAILY IN THE EVENING What  changed: when to take this   Enema Ready-To-Use 7-19 GM/118ML Enem Place rectally.   eucerin cream Apply 1 Application topically 2 (two) times daily. Apply to forehead and dry skin areas   feeding supplement Liqd Take 237 mLs by mouth 2 (two) times daily between meals.   magnesium oxide 400 (240 Mg) MG tablet Commonly known as: MAG-OX Take 400 mg by mouth daily.   mirtazapine 7.5 MG tablet Commonly known as: REMERON Take 7.5 mg by mouth at bedtime.   Multi-Vitamins Tabs Take 1 tablet by mouth every evening.   nitrofurantoin (macrocrystal-monohydrate) 100 MG capsule Commonly known as: MACROBID Take 100 mg by mouth at bedtime.   psyllium 58.6 % packet Commonly known as: METAMUCIL Take 1 packet by mouth daily.   rosuvastatin 20 MG tablet Commonly known as: CRESTOR Take 1 tablet (20 mg total) by mouth daily.   saccharomyces boulardii 250 MG capsule Commonly known as: FLORASTOR Take 1 capsule (250 mg total) by mouth 2 (two) times daily.   senna-docusate 8.6-50 MG tablet Commonly known as: Senokot-S Take 1 tablet by mouth at bedtime. Take on Monday, Wednesday, and Friday        Contact information for after-discharge care     Destination     [facility] Preferred SNF .   Service: Skilled Nursing Contact information: [address] [place] [address] [phone]                    Allergies  Allergen Reactions   Broccoli [Brassica Oleracea] Other (See Comments)   Fruit Extracts Other (See Comments)    unknown   Sulfa Antibiotics Other (See Comments)    She cant remember  what happens    Consultations: Neurology   Procedures/Studies: VAS [place] LOWER EXTREMITY VENOUS (DVT) Result Date: 09/06/2023  Lower Venous DVT Study Patient Name:  Valerie [name] [facility]  Date of Exam:   09/06/2023 Medical Rec #: [id number]            Accession #:    [id number] Date of Birth: May 08, 1946           Patient Gender: F  Patient Age:   78 years Exam Location:  [facility] Procedure:      VAS [place] LOWER EXTREMITY VENOUS (DVT) Referring Phys: Dorcas Carrow --------------------------------------------------------------------------------  Indications: Swelling, and Edema.  Risk Factors: Immobility Cancer Lung and Thyroid past pregnancy. Limitations: Body habitus, poor ultrasound/tissue interface and Limited range of motion. Comparison Study: No significant changes seen since previous exam 06/13/20. Performing Technologist: Shona Simpson  Examination Guidelines: A complete evaluation includes B-mode imaging, spectral Doppler, color Doppler, and power Doppler as needed of all accessible portions of each vessel. Bilateral testing is considered an integral part of a complete examination. Limited examinations for reoccurring indications may be performed as noted. The reflux portion of the exam is performed with the patient in reverse Trendelenburg.  +---------+---------------+---------+-----------+----------+-------------------+ RIGHT    CompressibilityPhasicitySpontaneityPropertiesThrombus Aging      +---------+---------------+---------+-----------+----------+-------------------+ CFV      Full           Yes  Yes                                      +---------+---------------+---------+-----------+----------+-------------------+ SFJ      Full                                                             +---------+---------------+---------+-----------+----------+-------------------+ FV Prox  Full                                                             +---------+---------------+---------+-----------+----------+-------------------+ FV Mid   Full                                                             +---------+---------------+---------+-----------+----------+-------------------+ FV DistalFull                                         Not well visualized  +---------+---------------+---------+-----------+----------+-------------------+ PFV      Full                                                             +---------+---------------+---------+-----------+----------+-------------------+ POP      Full           Yes      Yes                                      +---------+---------------+---------+-----------+----------+-------------------+ PTV      Full                                         Not well visualized +---------+---------------+---------+-----------+----------+-------------------+ PERO                                                  Not Visualized      +---------+---------------+---------+-----------+----------+-------------------+   +---------+---------------+---------+-----------+----------+-------------------+ LEFT     CompressibilityPhasicitySpontaneityPropertiesThrombus Aging      +---------+---------------+---------+-----------+----------+-------------------+ CFV      Full           Yes      Yes                                      +---------+---------------+---------+-----------+----------+-------------------+  SFJ      Full                                                             +---------+---------------+---------+-----------+----------+-------------------+ FV Prox  Full                                                             +---------+---------------+---------+-----------+----------+-------------------+ FV Mid   Full                                                             +---------+---------------+---------+-----------+----------+-------------------+ FV DistalFull                                                             +---------+---------------+---------+-----------+----------+-------------------+ PFV      Full                                                             +---------+---------------+---------+-----------+----------+-------------------+  POP      Full           Yes      Yes                                      +---------+---------------+---------+-----------+----------+-------------------+ PTV      Full                                         Not well visualized +---------+---------------+---------+-----------+----------+-------------------+ PERO                                                  Not Visualized      +---------+---------------+---------+-----------+----------+-------------------+     Summary: BILATERAL: - No evidence of deep vein thrombosis seen in the lower extremities, bilaterally. -No evidence of popliteal cyst, bilaterally.   *See table(s) above for measurements and observations. Electronically signed by Sherald Hess MD on 09/06/2023 at 5:06:48 PM.    Final    ECHOCARDIOGRAM COMPLETE Result Date: 09/04/2023    ECHOCARDIOGRAM REPORT   Patient Name:   JENNINGS BUSHNER [facility] Date of Exam: 09/04/2023 Medical Rec #:  [id number]           Height:       68.0  in Accession #:    [id number]          Weight:       184.0 lb Date of Birth:  Feb 05, 1946          BSA:          1.973 m Patient Age:    9 years            BP:           100/59 mmHg Patient Gender: F                   HR:           92 bpm. Exam Location:  Inpatient Procedure: 2D Echo, Color Doppler and Cardiac Doppler Indications:    TIA  History:        Patient has prior history of Echocardiogram examinations, most                 recent 03/28/2020.  Sonographer:    Irving Burton Senior RDCS Referring Phys: [phone] DEBBY CROSLEY  Sonographer Comments: Technically difficult study due to prior lung surgery, heart is very high and rotated IMPRESSIONS  1. Left ventricular ejection fraction, by estimation, is 25 to 30%. The left ventricle has severely decreased function. The left ventricle demonstrates global hypokinesis. Left ventricular diastolic parameters are consistent with Grade II diastolic dysfunction (pseudonormalization). Elevated left atrial pressure.  2. Right  ventricular systolic function is normal. The right ventricular size is normal. There is normal pulmonary artery systolic pressure. The estimated right ventricular systolic pressure is 18.7 mmHg.  3. Atrial septum not well visualized on this study, PFO noted on prior TEE.  4. The mitral valve is normal in structure. Trivial mitral valve regurgitation.  5. The aortic valve was not well visualized. Aortic valve regurgitation is mild to moderate. Aortic valve sclerosis/calcification is present, without any evidence of aortic stenosis.  6. The inferior vena cava is normal in size with greater than 50% respiratory variability, suggesting right atrial pressure of 3 mmHg. FINDINGS  Left Ventricle: Left ventricular ejection fraction, by estimation, is 25 to 30%. The left ventricle has severely decreased function. The left ventricle demonstrates global hypokinesis. The left ventricular internal cavity size was normal in size. There is no left ventricular hypertrophy. Left ventricular diastolic parameters are consistent with Grade II diastolic dysfunction (pseudonormalization). Elevated left atrial pressure. Right Ventricle: The right ventricular size is normal. Right vetricular wall thickness was not well visualized. Right ventricular systolic function is normal. There is normal pulmonary artery systolic pressure. The tricuspid regurgitant velocity is 1.98 m/s, and with an assumed right atrial pressure of 3 mmHg, the estimated right ventricular systolic pressure is 18.7 mmHg. Left Atrium: Left atrial size was normal in size. Right Atrium: Right atrial size was normal in size. Pericardium: There is no evidence of pericardial effusion. Mitral Valve: The mitral valve is normal in structure. There is mild thickening of the mitral valve leaflet(s). There is mild calcification of the mitral valve leaflet(s). Trivial mitral valve regurgitation. Tricuspid Valve: The tricuspid valve is normal in structure. Tricuspid valve  regurgitation is trivial. Aortic Valve: The aortic valve was not well visualized. Aortic valve regurgitation is mild to moderate. Aortic valve sclerosis/calcification is present, without any evidence of aortic stenosis. Pulmonic Valve: The pulmonic valve was grossly normal. Pulmonic valve regurgitation is trivial. Aorta: The ascending aorta was not well visualized and the aortic root is normal in size and structure. Venous: The inferior vena cava is normal  in size with greater than 50% respiratory variability, suggesting right atrial pressure of 3 mmHg. IAS/Shunts: The interatrial septum was not well visualized. A small patent foramen ovale is detected.  LEFT VENTRICLE PLAX 2D LVIDd:         4.30 cm     Diastology LVIDs:         3.80 cm     LV e' medial:    4.57 cm/s LV PW:         0.80 cm     LV E/e' medial:  21.6 LV IVS:        0.80 cm     LV e' lateral:   4.57 cm/s LVOT diam:     1.90 cm     LV E/e' lateral: 21.6 LV SV:         44 LV SV Index:   22 LVOT Area:     2.84 cm  LV Volumes (MOD) LV vol d, MOD A4C: 94.5 ml LV vol s, MOD A4C: 67.5 ml LV SV MOD A4C:     94.5 ml RIGHT VENTRICLE TAPSE (M-mode): 1.8 cm LEFT ATRIUM             Index        RIGHT ATRIUM           Index LA diam:        2.20 cm 1.12 cm/m   RA Area:     14.30 cm LA Vol (A2C):   38.7 ml 19.62 ml/m  RA Volume:   34.70 ml  17.59 ml/m LA Vol (A4C):   10.9 ml 5.53 ml/m LA Biplane Vol: 20.9 ml 10.60 ml/m  AORTIC VALVE LVOT Vmax:   100.00 cm/s LVOT Vmean:  71.800 cm/s LVOT VTI:    0.156 m  AORTA Ao Root diam: 3.20 cm MITRAL VALVE                TRICUSPID VALVE MV Area (PHT): 3.91 cm     TR Peak grad:   15.7 mmHg MV Decel Time: 194 msec     TR Vmax:        198.00 cm/s MV E velocity: 98.50 cm/s MV A velocity: 121.00 cm/s  SHUNTS MV E/A ratio:  0.81         Systemic VTI:  0.16 m                             Systemic Diam: 1.90 cm Epifanio Lesches MD Electronically signed by Epifanio Lesches MD Signature Date/Time: 09/04/2023/3:06:37 PM     Final    CT ANGIO HEAD NECK W WO CM Result Date: 09/04/2023 CLINICAL DATA:  Acute neurologic defect. EXAM: CT ANGIOGRAPHY HEAD AND NECK WITH AND WITHOUT CONTRAST TECHNIQUE: Multidetector CT imaging of the head and neck was performed using the standard protocol during bolus administration of intravenous contrast. Multiplanar CT image reconstructions and MIPs were obtained to evaluate the vascular anatomy. Carotid stenosis measurements (when applicable) are obtained utilizing NASCET criteria, using the distal internal carotid diameter as the denominator. RADIATION DOSE REDUCTION: This exam was performed according to the departmental dose-optimization program which includes automated exposure control, adjustment of the mA and/or kV according to patient size and/or use of iterative reconstruction technique. CONTRAST:  75mL OMNIPAQUE IOHEXOL 350 MG/ML SOLN COMPARISON:  Brain MRI from yesterday. CTA of the head neck 10/02/2021 FINDINGS: CT HEAD FINDINGS Noncontrast head CT is motion degraded with most images being nondiagnostic known  acute white matter infarct with no evidence of superimposed hemorrhage. Dystrophic cortical linear calcification in the bilateral occipital lobes, greater on the left beneath the craniotomy site. CTA NECK FINDINGS Aortic arch: 3 vessel branching and atherosclerosis. Right carotid system: Atheromatous calcification at the bifurcation. No stenosis or ulceration. Left carotid system: Mild atheromatous plaque at the bifurcation. No stenosis or ulceration. Vertebral arteries: Subclavian atherosclerosis without significant stenosis. The vertebral arteries are smoothly contoured and diffusely patent. Skeleton: No acute finding Other neck: No acute finding Upper chest: Partially covered layering pleural effusions and right upper lobe airway opacification stable from 03/01/2023. Known history of lung cancer. Review of the MIP images confirms the above findings CTA HEAD FINDINGS Anterior  circulation: Mild atheromatous calcification of the cavernous carotids. No major branch occlusion, beading, or aneurysm. Posterior circulation: Small vertebral and basilar arteries in the setting of fetal type bilateral PCA flow. Segmental narrowing of the mid and distal basilar with high-grade stenoses at the upper and lower margins of this stenosis, progressed, see 16:63. Venous sinuses: Unremarkable for arterial timing. Review of the MIP images confirms the above findings IMPRESSION: 1. No acute arterial finding to correlate with the acute infarct in the right cerebral white matter. 2. Progressive and high-grade narrowing of the basilar compared to 10/01/2021, presumably atheromatous. There is fetal type bilateral PCA flow. 3. The noncontrast head CT portion of the study is nondiagnostic due to motion. 4. Airway thickening and opacification in the right upper lobe also noted on most recent staging chest CT, attention on follow-up. Electronically Signed   By: Tiburcio Pea M.D.   On: 09/04/2023 05:24   MR BRAIN WO CONTRAST Addendum Date: 09/03/2023 ADDENDUM REPORT: 09/03/2023 22:53 ADDENDUM: There was a synchronization error that caused the wrong report to be associated with this scan. The below is a corrected report that corresponds to the scan for [facility]. Brain: There is a small acute infarct at the tail of the right caudate. Fewer than 5 scattered microhemorrhages in a nonspecific pattern. There is multifocal hyperintense T2-weighted signal within the white matter. Generalized volume loss. The midline structures are normal. Vascular: Normal flow voids. Skull and upper cervical spine: Normal calvarium and skull base. Visualized upper cervical spine and soft tissues are normal. Sinuses/Orbits:No paranasal sinus fluid levels or advanced mucosal thickening. No mastoid or middle ear effusion. Normal orbits. IMPRESSION: 1. A software synchronization error caused an incorrect report to be associated  with this examination. Please disregard the initial report. 2. There is a small acute infarct of the right caudate nucleus. No acute hemorrhage. 3. Chronic ischemic microangiopathy. These results were called by telephone at the time of interpretation on 09/03/2023 at 10:51 pm to provider [facility] , who verbally acknowledged these results. Electronically Signed   By: Deatra Robinson M.D.   On: 09/03/2023 22:53   Result Date: 09/03/2023 CLINICAL DATA:  Acute neurologic deficit EXAM: MRI HEAD WITHOUT CONTRAST TECHNIQUE: Multiplanar, multiecho pulse sequences of the brain and surrounding structures were obtained without intravenous contrast. COMPARISON:  10/01/2021 FINDINGS: Brain: There is DWI hyperintensity throughout much of the left MCA territory, subjacent to a large area of encephalomalacia. No corresponding ADC deficit. Hemosiderin deposition over the left MCA encephalomalacia territory. Multifocal chronic microhemorrhage in a nonspecific pattern. There is multifocal hyperintense T2-weighted signal within the white matter. Parenchymal volume and CSF spaces are normal. 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 The midline structures are normal. Vascular: Normal flow voids. Skull and upper cervical spine: Normal calvarium and  skull base. Visualized upper cervical spine and soft tissues are normal. Sinuses/Orbits:No paranasal sinus fluid levels or advanced mucosal thickening. No mastoid or middle ear effusion. Normal orbits. IMPRESSION: 1. DWI hyperintensity throughout much of the left MCA territory, subjacent to a large area of encephalomalacia. This may indicate an area of subacute ischemia versus T2 shine through related to the old left MCA infarct. 2. 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 Electronically Signed: By: Deatra Robinson M.D. On: 09/03/2023 22:18   CT HEAD WO CONTRAST Result Date: 09/03/2023 CLINICAL DATA:  Weakness, stroke suspected EXAM: CT HEAD WITHOUT  CONTRAST TECHNIQUE: Contiguous axial images were obtained from the base of the skull through the vertex without intravenous contrast. RADIATION DOSE REDUCTION: This exam was performed according to the departmental dose-optimization program which includes automated exposure control, adjustment of the mA and/or kV according to patient size and/or use of iterative reconstruction technique. COMPARISON:  10/01/2021 CT head FINDINGS: Brain: No evidence of acute infarction, hemorrhage, mass, mass effect, or midline shift. No hydrocephalus or extra-axial fluid collection. Redemonstrated cerebellar atrophy. Redemonstrated encephalomalacia with laminar necrosis and calcifications in the bilateral occipital lobes. Periventricular white matter changes, likely the sequela of chronic small vessel ischemic disease. More focal hypodensity in the right posterior lentiform nucleus and corona radiata is new from the prior exam and favored to represent a remote lacunar infarct. Vascular: No hyperdense vessel. Skull: Prior left parietooccipital craniotomy. Negative for fracture or focal lesion. Sinuses/Orbits: No acute finding. Other: The mastoid air cells are well aerated. IMPRESSION: 1. No acute intracranial process. 2. More focal hypodensity in the right posterior lentiform nucleus and corona radiata is new from the prior exam and favored to represent a remote lacunar infarct. If there is concern for an acute infarct, consider MRI for further evaluation. Electronically Signed   By: Wiliam Ke M.D.   On: 09/03/2023 17:12   (Echo, Carotid, EGD, Colonoscopy, ERCP)    Subjective: Patient seen in the morning rounds.  Denies any complaints.  No new events overnight.  Comfortable with plan to go back to [facility].   Discharge Exam: Vitals:   09/07/23 0344 09/07/23 0719  BP: 116/61 (!) 111/96  Pulse: 82 71  Resp: 18 18  Temp: 97.7 F (36.5 C) 97.9 F (36.6 C)  SpO2: 95% 95%   Vitals:   09/06/23 2039 09/06/23 2352  09/07/23 0344 09/07/23 0719  BP: 124/63 139/71 116/61 (!) 111/96  Pulse: 93 80 82 71  Resp: 18 18 18 18   Temp: 97.9 F (36.6 C) 97.9 F (36.6 C) 97.7 F (36.5 C) 97.9 F (36.6 C)  TempSrc: Oral Oral Oral Oral  SpO2: 97% 97% 95% 95%  Weight:      Height:        General: Pt is alert, awake, not in acute distress Frail and debilitated.  Chronically sick looking.  Not in distress. Cardiovascular: RRR, S1/S2 +, no rubs, no gallops Respiratory: CTA bilaterally, no wheezing, no rhonchi Abdominal: Soft, NT, ND, bowel sounds + Extremities: Asymmetrical swelling of the left leg.  Nontender. She has mild left facial droop Left upper extremity 3/5 Left lower extremity 2/5    The results of significant diagnostics from this hospitalization (including imaging, microbiology, ancillary and laboratory) are listed below for reference.     Microbiology: Recent Results (from the past 240 hours)  MRSA Next Gen by PCR, Nasal     Status: None   Collection Time: 09/05/23 10:40 AM   Specimen: Nasal Mucosa; Nasal  Swab  Result Value Ref Range Status   MRSA by PCR Next Gen NOT DETECTED NOT DETECTED Final    Comment: (NOTE) The GeneXpert MRSA Assay (FDA approved for NASAL specimens only), is one component of a comprehensive MRSA colonization surveillance program. It is not intended to diagnose MRSA infection nor to guide or monitor treatment for MRSA infections. Test performance is not FDA approved in patients less than 10 years old. Performed at [facility], [address] [place], [place] [address]      Labs: BNP (last 3 results) No results for input(s): "BNP" in the last 8760 hours. Basic Metabolic Panel: Recent Labs  Lab 09/03/23 1506 09/03/23 1512 09/04/23 0412  NA 138 137 139  K 4.7 4.6 4.5  CL 103 104 106  CO2 23  --  22  GLUCOSE 97 92 117*  BUN 20 23 18   CREATININE 0.95 0.90 0.94  CALCIUM 9.7  --  9.7  MG  --   --  2.3   Liver Function Tests: Recent Labs  Lab  09/03/23 1506  AST 21  ALT 14  ALKPHOS 76  BILITOT 0.6  PROT 6.8  ALBUMIN 3.6   No results for input(s): "LIPASE", "AMYLASE" in the last 168 hours. No results for input(s): "AMMONIA" in the last 168 hours. CBC: Recent Labs  Lab 09/03/23 1506 09/03/23 1512 09/04/23 0412  WBC 8.0  --  13.4*  NEUTROABS 5.3  --  11.4*  HGB 13.1 13.9 13.9  HCT 42.0 41.0 44.1  MCV 95.7  --  93.0  PLT 268  --  302   Cardiac Enzymes: No results for input(s): "CKTOTAL", "CKMB", "CKMBINDEX", "TROPONINI" in the last 168 hours. BNP: Invalid input(s): "POCBNP" CBG: Recent Labs  Lab 09/06/23 2352  GLUCAP 100*   D-Dimer No results for input(s): "DDIMER" in the last 72 hours. Hgb A1c No results for input(s): "HGBA1C" in the last 72 hours. Lipid Profile No results for input(s): "CHOL", "HDL", "LDLCALC", "TRIG", "CHOLHDL", "LDLDIRECT" in the last 72 hours. Thyroid function studies No results for input(s): "TSH", "T4TOTAL", "T3FREE", "THYROIDAB" in the last 72 hours.  Invalid input(s): "FREET3" Anemia work up No results for input(s): "VITAMINB12", "FOLATE", "FERRITIN", "TIBC", "IRON", "RETICCTPCT" in the last 72 hours. Urinalysis    Component Value Date/Time   COLORURINE YELLOW 01/03/2022 0834   APPEARANCEUR CLOUDY (A) 01/03/2022 0834   LABSPEC 1.009 01/03/2022 0834   PHURINE 5.0 01/03/2022 0834   GLUCOSEU NEGATIVE 01/03/2022 0834   HGBUR SMALL (A) 01/03/2022 0834   BILIRUBINUR NEGATIVE 01/03/2022 0834   BILIRUBINUR negative 05/25/2019 1653   KETONESUR NEGATIVE 01/03/2022 0834   PROTEINUR NEGATIVE 01/03/2022 0834   UROBILINOGEN 0.2 05/25/2019 1653   UROBILINOGEN 0.2 10/20/2014 1636   NITRITE NEGATIVE 01/03/2022 0834   LEUKOCYTESUR LARGE (A) 01/03/2022 0834   Sepsis Labs Recent Labs  Lab 09/03/23 1506 09/04/23 0412  WBC 8.0 13.4*   Microbiology Recent Results (from the past 240 hours)  MRSA Next Gen by PCR, Nasal     Status: None   Collection Time: 09/05/23 10:40 AM   Specimen:  Nasal Mucosa; Nasal Swab  Result Value Ref Range Status   MRSA by PCR Next Gen NOT DETECTED NOT DETECTED Final    Comment: (NOTE) The GeneXpert MRSA Assay (FDA approved for NASAL specimens only), is one component of a comprehensive MRSA colonization surveillance program. It is not intended to diagnose MRSA infection nor to guide or monitor treatment for MRSA infections. Test performance is not FDA approved  in patients less than 35 years old. Performed at [facility], [address] [place], [place] [address]      Time coordinating discharge: 35 minutes  SIGNED:   Dorcas Carrow, MD  [facility] 09/07/2023, 10:07 AM

## 2023-09-07 NOTE — Plan of Care (Signed)
  Problem: Education: Goal: Knowledge of disease or condition will improve Outcome: Progressing Goal: Knowledge of secondary prevention will improve (MUST DOCUMENT ALL) Outcome: Progressing Goal: Knowledge of patient specific risk factors will improve Loraine Leriche N/A or DELETE if not current risk factor) Outcome: Progressing   Problem: Ischemic Stroke/TIA Tissue Perfusion: Goal: Complications of ischemic stroke/TIA will be minimized Outcome: Progressing   Problem: Coping: Goal: Will verbalize positive feelings about self Outcome: Progressing Goal: Will identify appropriate support needs Outcome: Progressing   Problem: Health Behavior/Discharge Planning: Goal: Ability to manage health-related needs will improve Outcome: Progressing Goal: Goals will be collaboratively established with patient/family Outcome: Progressing   Problem: Self-Care: Goal: Ability to participate in self-care as condition permits will improve Outcome: Progressing Goal: Verbalization of feelings and concerns over difficulty with self-care will improve Outcome: Progressing Goal: Ability to communicate needs accurately will improve Outcome: Progressing   Problem: Nutrition: Goal: Risk of aspiration will decrease Outcome: Progressing Goal: Dietary intake will improve Outcome: Progressing   Problem: Education: Goal: Knowledge of General Education information will improve Description: Including pain rating scale, medication(s)/side effects and non-pharmacologic comfort measures Outcome: Progressing   Problem: Health Behavior/Discharge Planning: Goal: Ability to manage health-related needs will improve Outcome: Progressing   Problem: Clinical Measurements: Goal: Ability to maintain clinical measurements within normal limits will improve Outcome: Progressing Goal: Will remain free from infection Outcome: Progressing Goal: Diagnostic test results will improve Outcome: Progressing Goal: Respiratory  complications will improve Outcome: Progressing Goal: Cardiovascular complication will be avoided Outcome: Progressing   Problem: Activity: Goal: Risk for activity intolerance will decrease Outcome: Progressing   Problem: Nutrition: Goal: Adequate nutrition will be maintained Outcome: Progressing   Problem: Coping: Goal: Level of anxiety will decrease Outcome: Progressing   Problem: Elimination: Goal: Will not experience complications related to bowel motility Outcome: Progressing Goal: Will not experience complications related to urinary retention Outcome: Progressing   Problem: Pain Managment: Goal: General experience of comfort will improve and/or be controlled Outcome: Progressing   Problem: Safety: Goal: Ability to remain free from injury will improve Outcome: Progressing   Problem: Skin Integrity: Goal: Risk for impaired skin integrity will decrease Outcome: Progressing

## 2023-09-07 NOTE — TOC Transition Note (Signed)
Transition of Care [facility]) - Discharge Note   Patient Details  Name: Valerie [name] MRN: [id number] Date of Birth: 20-Apr-1946  Transition [facility] Care [facility]) CM/SW Contact:  Deatra Robinson, [place] Phone Number: 09/07/2023, 10:47 AM   Clinical Narrative: Pt for dc back to [facility] today where she is a LTC resident. Per [place] they were able to obtain [facility] auth for additional rehab and are prepared to admit pt to room 408. Pt's dtr [name] aware of dc and reports agreeable. RN provided with number for report and [facility] arranged for transport. SW signing off at dc.   Dellie Burns, MSW, LCSW [phone] (coverage)        Final next level of care: Skilled Nursing Facility Barriers to Discharge: Barriers Resolved   Patient Goals and CMS Choice Patient states their goals for this hospitalization and ongoing recovery are:: Patient unable to particiapte in goal setting, not fully oreinted          Discharge Placement              Patient chooses bed at: [facility] Patient to be transferred to facility by: [facility] Name of family member notified: [name]/dtr Patient and family notified of of transfer: 09/07/23  Discharge Plan and Services Additional resources added to the After Visit Summary for       Post Acute Care Choice: Skilled Nursing Facility                               Social Drivers of Health (SDOH) Interventions SDOH Screenings   Food Insecurity: No Food Insecurity (09/04/2023)  Housing: Unknown (09/04/2023)  Transportation Needs: No Transportation Needs (09/04/2023)  Utilities: Not At Risk (09/04/2023)  Depression (PHQ2-9): Low Risk  (09/14/2020)  Financial Resource Strain: Low Risk  (09/14/2020)  Physical Activity: Inactive (09/14/2020)  Social Connections: Unknown (09/05/2023)  Stress: No Stress Concern Present (09/14/2020)  Tobacco Use: Low Risk  (09/03/2023)     Readmission Risk Interventions     No data to display

## 2023-09-08 IMAGING — CT CT CHEST W/ CM
2 of 5 series · 12 of 36 positions shown, 15 images · IV contrast (OMNIPAQUE)
Comparison: CT abdomen pelvis, 04/17/2021, CT chest abdomen pelvis,
02/23/2021

CLINICAL DATA: Non-small cell lung cancer staging, status post left
lower lobectomy, radiation therapy complete, ongoing chemotherapy

EXAM:
CT CHEST, ABDOMEN, AND PELVIS WITH CONTRAST
TECHNIQUE: Multidetector CT imaging of the chest, abdomen and pelvis was
performed following the standard protocol during bolus
administration of intravenous contrast.

[Series 3: cap with · axial · 0.87mm/px · z∈[-450,+75]mm · 9 of 133 slices shown, 12 images]
[im 14/133  mediastinal]
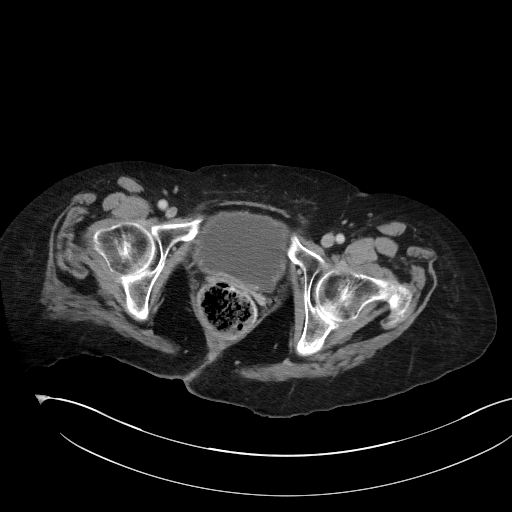
[im 14/133  lung]
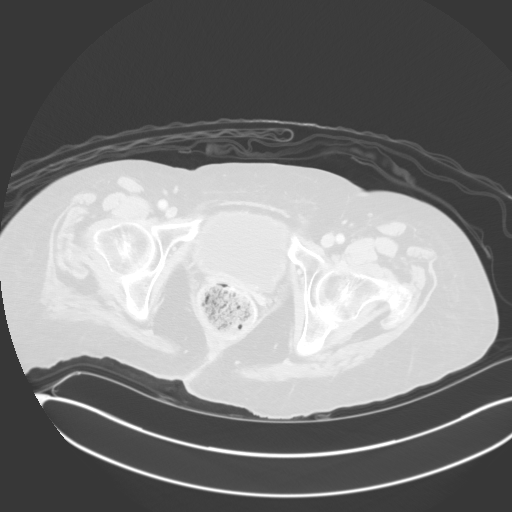
[im 27/133  lung]
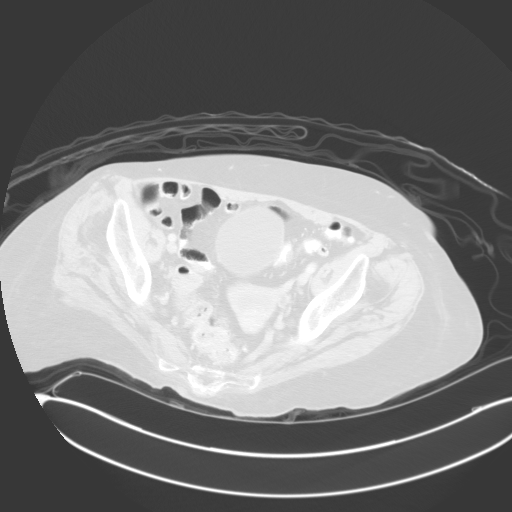
[im 40/133  lung]
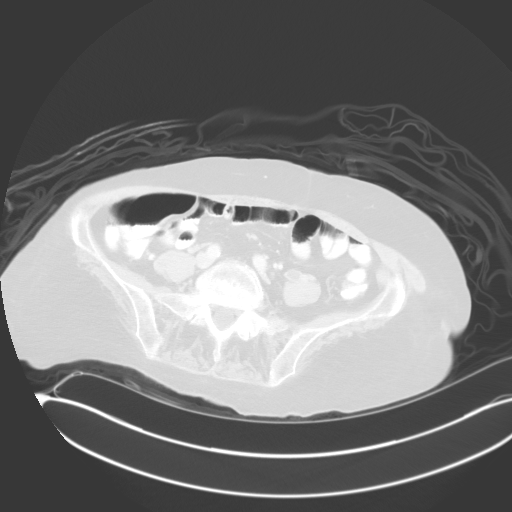
[im 53/133  lung]
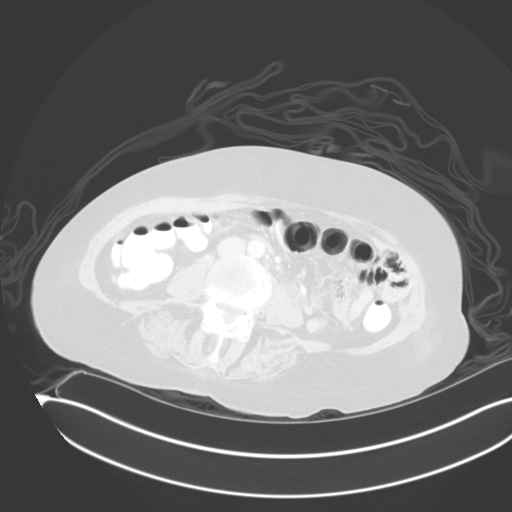
[im 67/133  mediastinal]
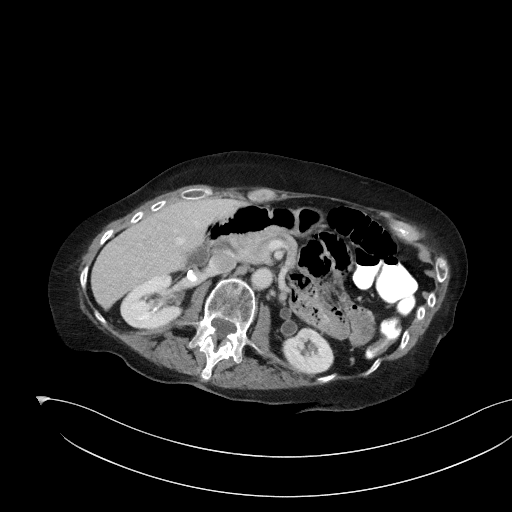
[im 67/133  lung]
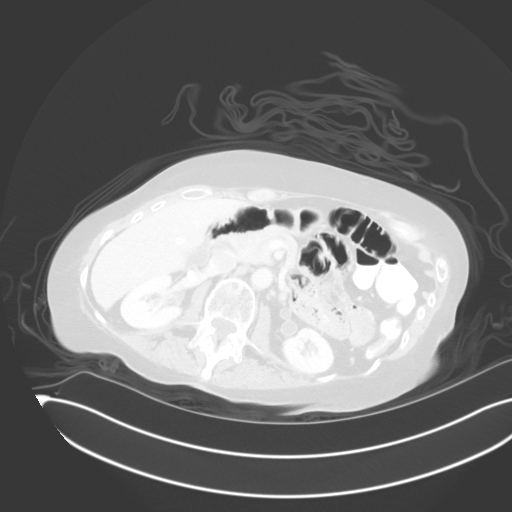
[im 80/133  lung]
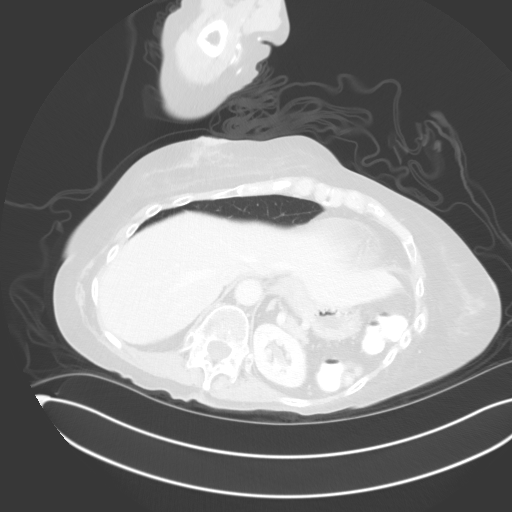
[im 93/133  lung]
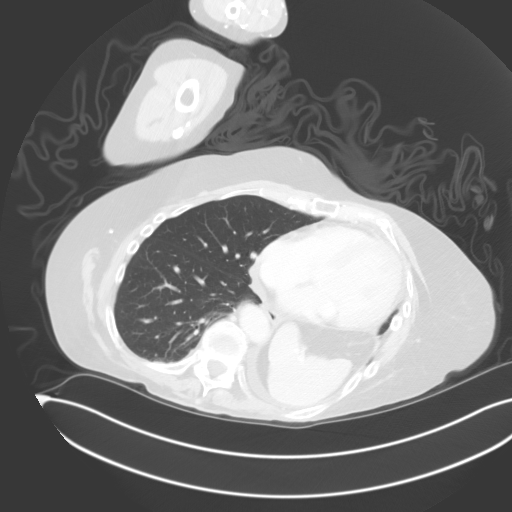
[im 106/133  lung]
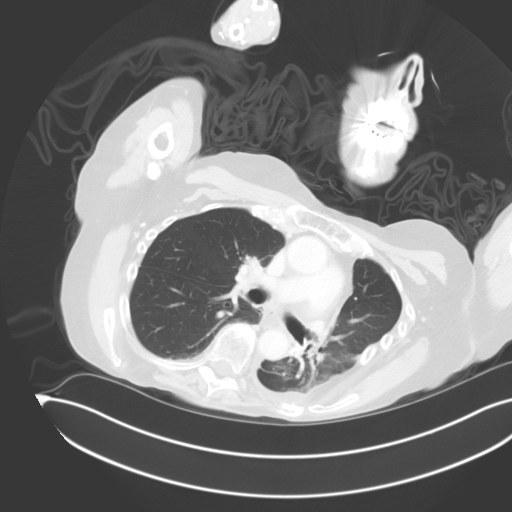
[im 119/133  mediastinal]
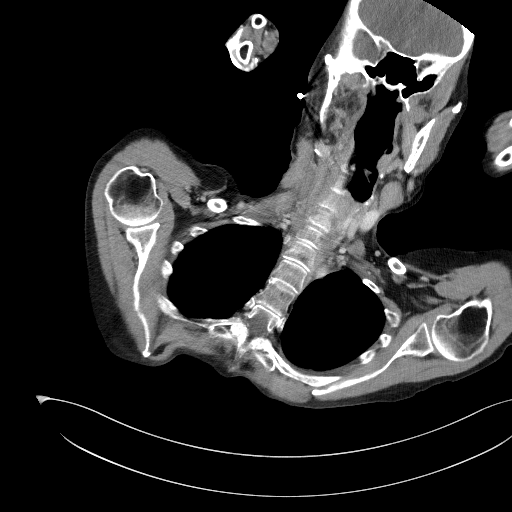
[im 119/133  lung]
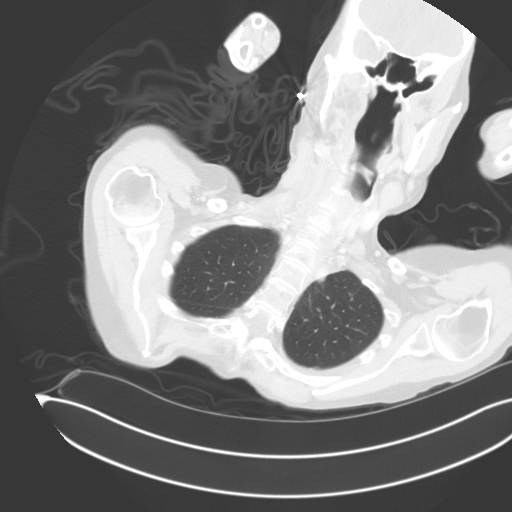

[Series 5: coronals · coronal · 0.81mm/px · 3 of 140 slices shown]
[im 28/140  lung]
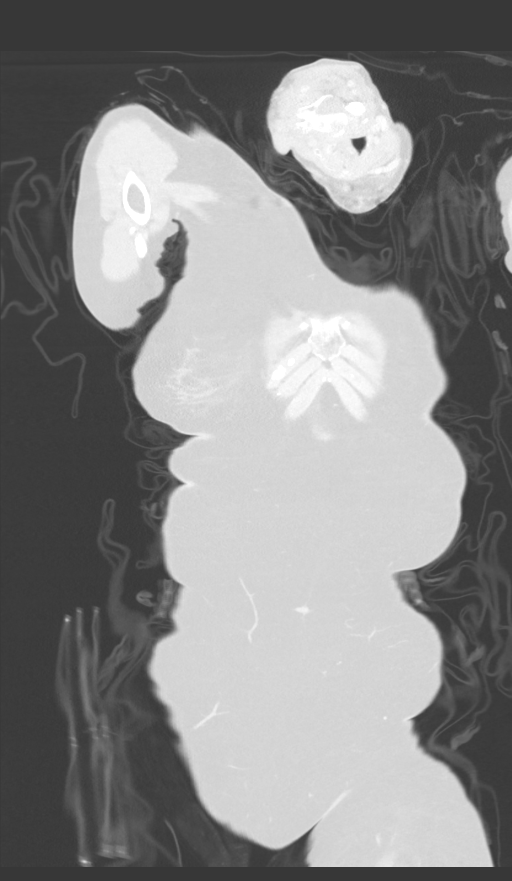
[im 56/140  lung]
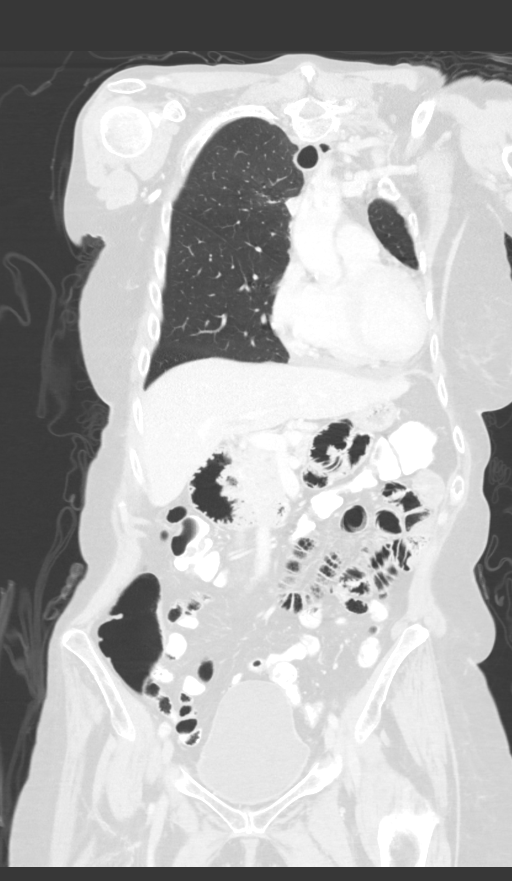
[im 84/140  lung]
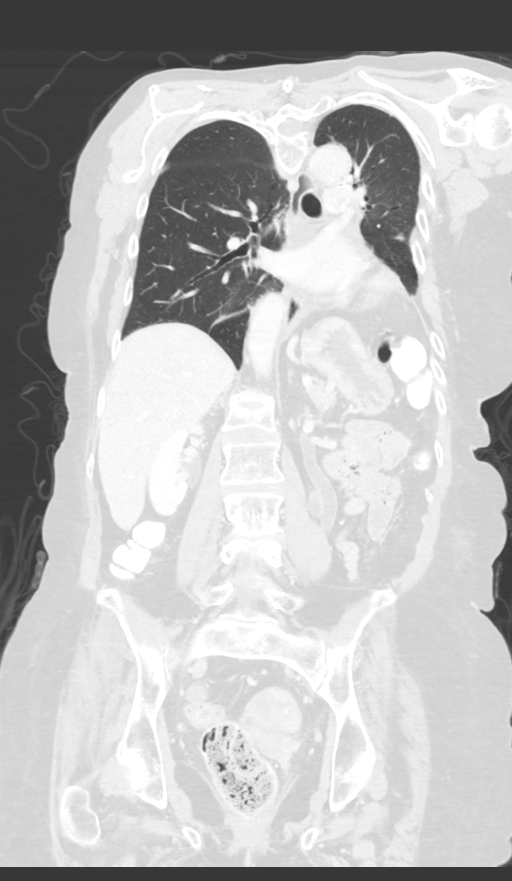

[12 of 36 positions shown; findings below may reference images not displayed]

RADIATION DOSE REDUCTION: This exam was performed according to the
departmental dose-optimization program which includes automated
exposure control, adjustment of the mA and/or kV according to
patient size and/or use of iterative reconstruction technique.

CONTRAST:  100mL OMNIPAQUE IOHEXOL 300 MG/ML SOLN, additional oral
enteric contrast
FINDINGS: CT CHEST FINDINGS

Cardiovascular: Aortic atherosclerosis. Normal heart size.
Three-vessel coronary artery calcifications. No pericardial
effusion.

Mediastinum/Nodes: No enlarged mediastinal, hilar, or axillary lymph
nodes. Thyroid gland, trachea, and esophagus demonstrate no
significant findings.

Lungs/Pleura: Redemonstrated postoperative findings status post left
lower lobectomy with perihilar and paramedian radiation fibrosis and
consolidation of the left lung, as well as peribronchovascular
consolidation and fibrosis of the central perihilar right upper lobe
(series 7, image 59). Small, loculated left pleural effusion.
Stable, benign 0.4 cm nodule of the peripheral right upper lobe
(series 7, image 56).

Musculoskeletal: No chest wall mass or suspicious osseous lesions
identified.

CT ABDOMEN PELVIS FINDINGS

Hepatobiliary: No solid liver abnormality is seen. No gallstones,
gallbladder wall thickening, or biliary dilatation.

Pancreas: Unremarkable. No pancreatic ductal dilatation or
surrounding inflammatory changes.

Spleen: Normal in size without significant abnormality.

Adrenals/Urinary Tract: Adrenal glands are unremarkable.
Redemonstrated duplication of the left renal collecting systems,
with unchanged moderate hydronephrosis and hydroureter of the
superior and inferior pole moieties to the ureterovesicular
junctions. The right kidney is normal, without renal calculi, solid
lesion, or hydronephrosis. Bladder is unremarkable.

Stomach/Bowel: Stomach is within normal limits. Appendix appears
normal. No evidence of bowel wall thickening, distention, or
inflammatory changes.

Vascular/Lymphatic: Aortic atherosclerosis. No enlarged abdominal or
pelvic lymph nodes.

Reproductive: Uterine fibroids.

Other: No abdominal wall hernia or abnormality. No ascites.

Musculoskeletal: No acute osseous findings. Unchanged, chronic
superior endplate and wedge deformities of the T10, L1, L4, and L5
vertebral bodies (series 6, image 99).
IMPRESSION: 1. Redemonstrated postoperative findings status post left lower
lobectomy with perihilar and paramedian consolidation and radiation
fibrosis of the left lung, as well as peribronchovascular
consolidation and radiation fibrosis of the central perihilar right
upper lobe. No evidence of recurrent malignancy in the chest. The
presence of residual or recurrent metabolically active malignancy
within areas of fibrosis may be more sensitively assessed by PET-CT
if desired.
2. Small, loculated left pleural effusion.
3. No evidence of metastatic disease in the abdomen or pelvis.
4. Redemonstrated duplication of the left renal collecting systems,
with unchanged moderate hydronephrosis and hydroureter of the
superior and inferior pole moieties to the ureterovesicular
junctions. Previously seen right-sided hydronephrosis is resolved.
5. Unchanged thoracic and lumbar wedge and superior endplate
deformities.
6. Coronary artery disease.

Aortic Atherosclerosis (VPV9B-LIQ.Q).

## 2023-09-08 IMAGING — CT CT ABD-PELV W/ CM
2 of 5 series · 12 of 36 positions shown, 15 images · IV contrast (OMNIPAQUE)
Comparison: CT abdomen pelvis, 04/17/2021, CT chest abdomen pelvis,
02/23/2021

CLINICAL DATA: Non-small cell lung cancer staging, status post left
lower lobectomy, radiation therapy complete, ongoing chemotherapy

EXAM:
CT CHEST, ABDOMEN, AND PELVIS WITH CONTRAST
TECHNIQUE: Multidetector CT imaging of the chest, abdomen and pelvis was
performed following the standard protocol during bolus
administration of intravenous contrast.

[Series 3: cap with · axial · 0.87mm/px · z∈[-450,+75]mm · 9 of 133 slices shown, 12 images]
[im 14/133  mediastinal]
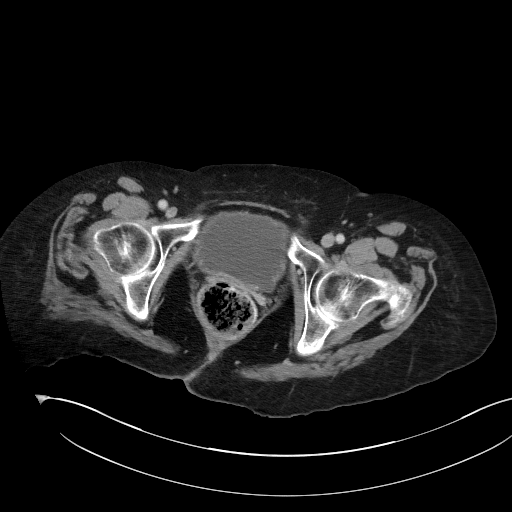
[im 14/133  lung]
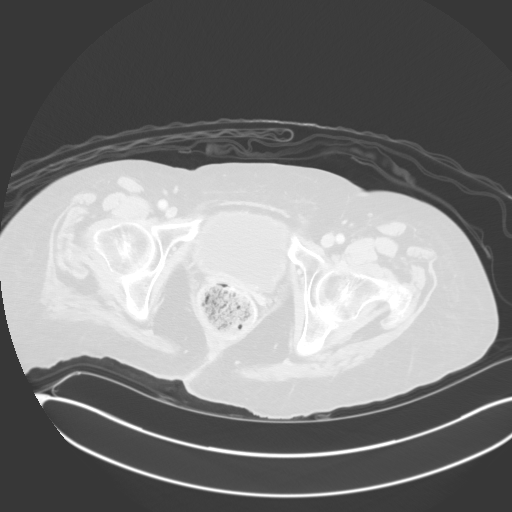
[im 27/133  lung]
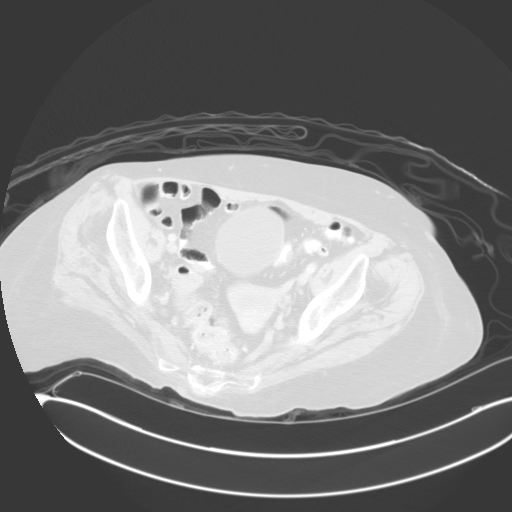
[im 40/133  lung]
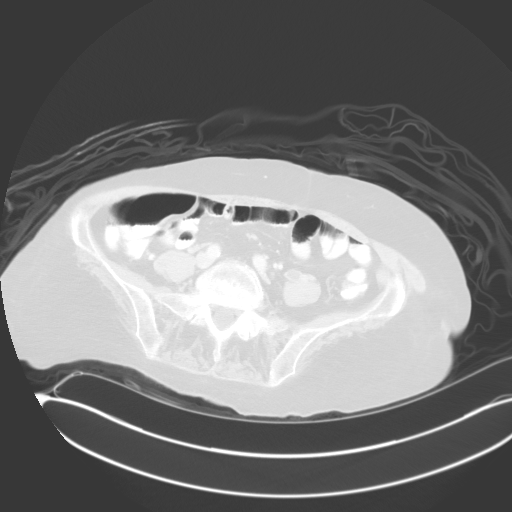
[im 53/133  lung]
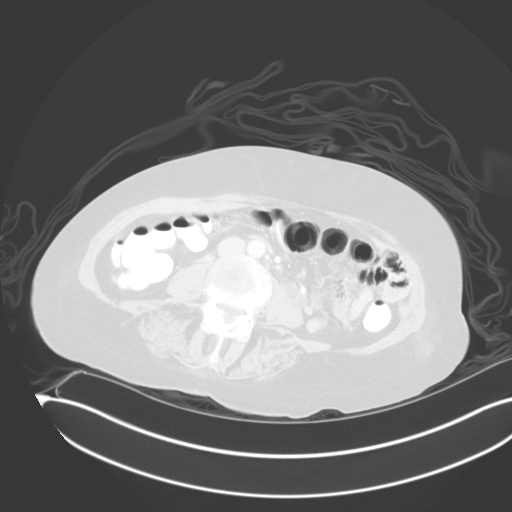
[im 67/133  mediastinal]
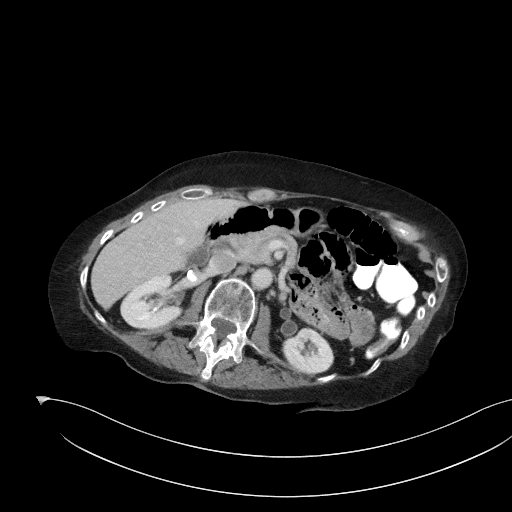
[im 67/133  lung]
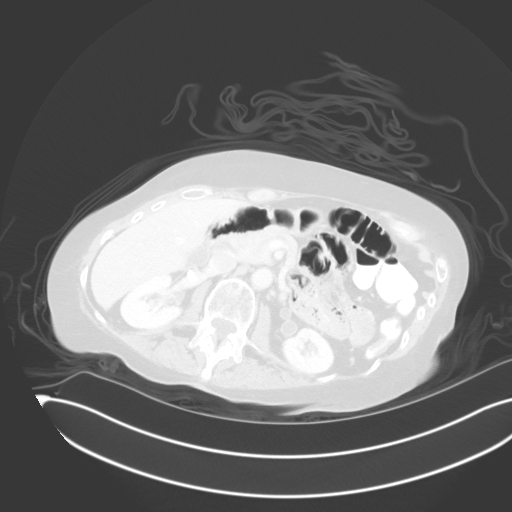
[im 80/133  lung]
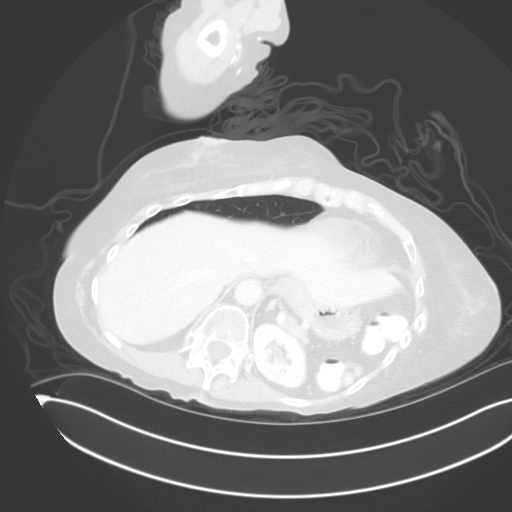
[im 93/133  lung]
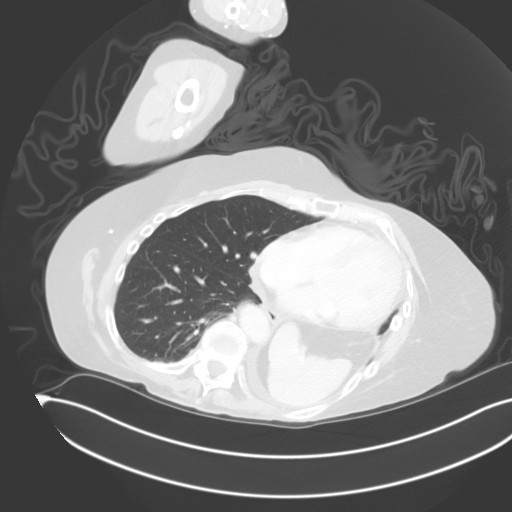
[im 106/133  lung]
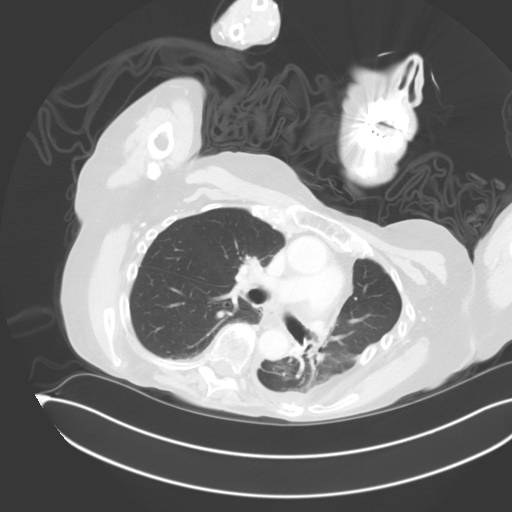
[im 119/133  mediastinal]
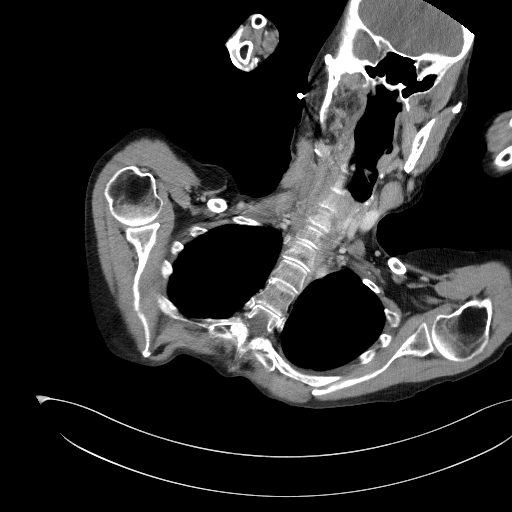
[im 119/133  lung]
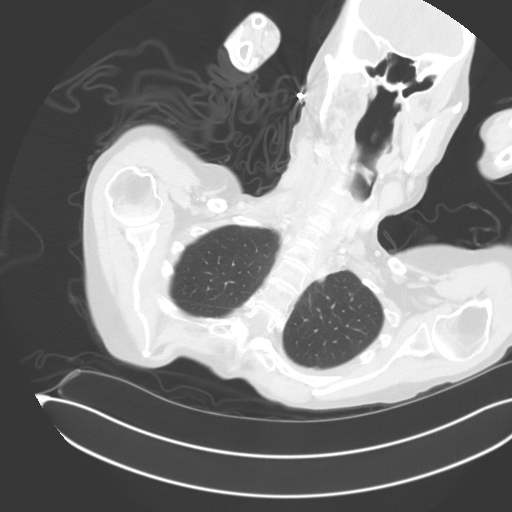

[Series 5: coronals · coronal · 0.81mm/px · 3 of 140 slices shown]
[im 28/140  lung]
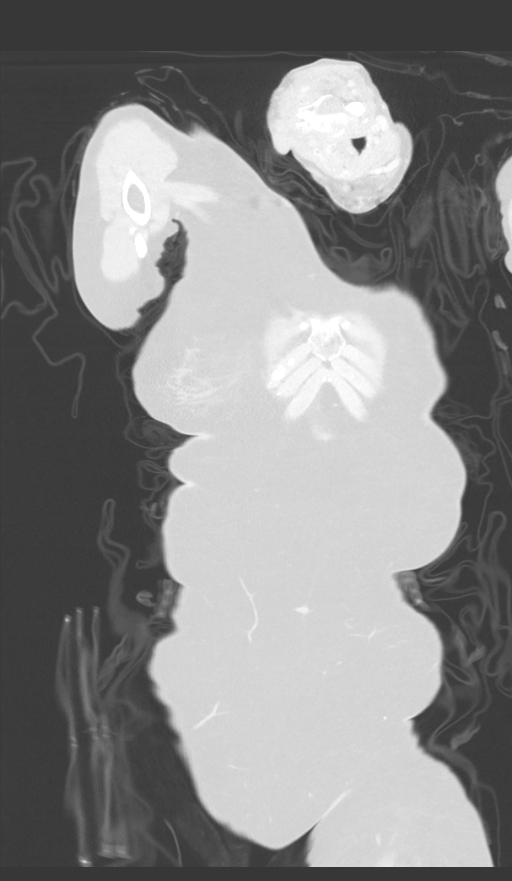
[im 56/140  lung]
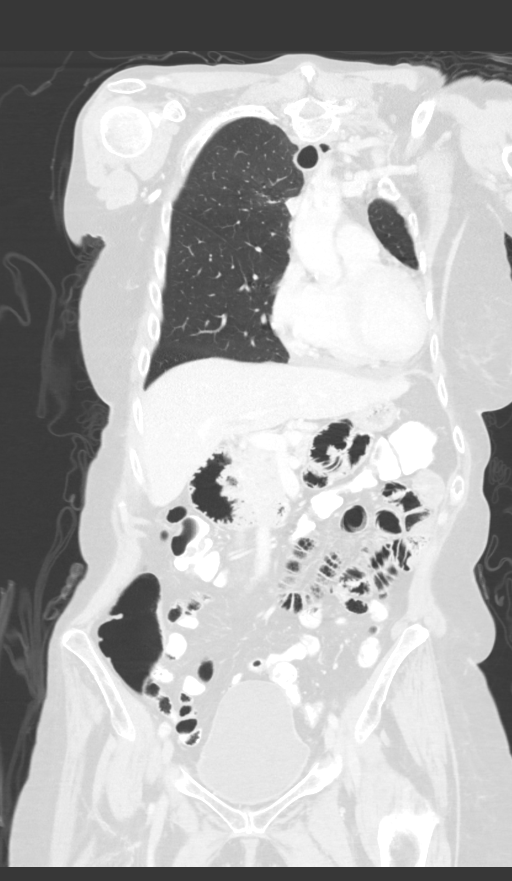
[im 84/140  lung]
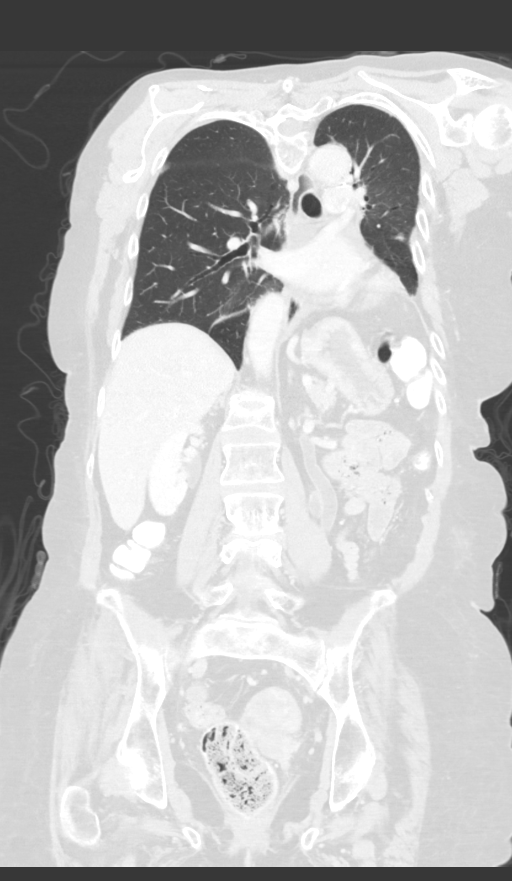

[12 of 36 positions shown; findings below may reference images not displayed]

RADIATION DOSE REDUCTION: This exam was performed according to the
departmental dose-optimization program which includes automated
exposure control, adjustment of the mA and/or kV according to
patient size and/or use of iterative reconstruction technique.

CONTRAST:  100mL OMNIPAQUE IOHEXOL 300 MG/ML SOLN, additional oral
enteric contrast
FINDINGS: CT CHEST FINDINGS

Cardiovascular: Aortic atherosclerosis. Normal heart size.
Three-vessel coronary artery calcifications. No pericardial
effusion.

Mediastinum/Nodes: No enlarged mediastinal, hilar, or axillary lymph
nodes. Thyroid gland, trachea, and esophagus demonstrate no
significant findings.

Lungs/Pleura: Redemonstrated postoperative findings status post left
lower lobectomy with perihilar and paramedian radiation fibrosis and
consolidation of the left lung, as well as peribronchovascular
consolidation and fibrosis of the central perihilar right upper lobe
(series 7, image 59). Small, loculated left pleural effusion.
Stable, benign 0.4 cm nodule of the peripheral right upper lobe
(series 7, image 56).

Musculoskeletal: No chest wall mass or suspicious osseous lesions
identified.

CT ABDOMEN PELVIS FINDINGS

Hepatobiliary: No solid liver abnormality is seen. No gallstones,
gallbladder wall thickening, or biliary dilatation.

Pancreas: Unremarkable. No pancreatic ductal dilatation or
surrounding inflammatory changes.

Spleen: Normal in size without significant abnormality.

Adrenals/Urinary Tract: Adrenal glands are unremarkable.
Redemonstrated duplication of the left renal collecting systems,
with unchanged moderate hydronephrosis and hydroureter of the
superior and inferior pole moieties to the ureterovesicular
junctions. The right kidney is normal, without renal calculi, solid
lesion, or hydronephrosis. Bladder is unremarkable.

Stomach/Bowel: Stomach is within normal limits. Appendix appears
normal. No evidence of bowel wall thickening, distention, or
inflammatory changes.

Vascular/Lymphatic: Aortic atherosclerosis. No enlarged abdominal or
pelvic lymph nodes.

Reproductive: Uterine fibroids.

Other: No abdominal wall hernia or abnormality. No ascites.

Musculoskeletal: No acute osseous findings. Unchanged, chronic
superior endplate and wedge deformities of the T10, L1, L4, and L5
vertebral bodies (series 6, image 99).
IMPRESSION: 1. Redemonstrated postoperative findings status post left lower
lobectomy with perihilar and paramedian consolidation and radiation
fibrosis of the left lung, as well as peribronchovascular
consolidation and radiation fibrosis of the central perihilar right
upper lobe. No evidence of recurrent malignancy in the chest. The
presence of residual or recurrent metabolically active malignancy
within areas of fibrosis may be more sensitively assessed by PET-CT
if desired.
2. Small, loculated left pleural effusion.
3. No evidence of metastatic disease in the abdomen or pelvis.
4. Redemonstrated duplication of the left renal collecting systems,
with unchanged moderate hydronephrosis and hydroureter of the
superior and inferior pole moieties to the ureterovesicular
junctions. Previously seen right-sided hydronephrosis is resolved.
5. Unchanged thoracic and lumbar wedge and superior endplate
deformities.
6. Coronary artery disease.

Aortic Atherosclerosis (VPV9B-LIQ.Q).

## 2023-09-16 IMAGING — CT CT ABD-PELV W/ CM
2 of 5 series · 15 of 46 positions shown, 17 images · IV contrast (agent unspecified)
Comparison: 09/12/2021

CLINICAL DATA: Dysuria, dementia, worsening sacral decubitus ulcers

EXAM:
CT ABDOMEN AND PELVIS WITH CONTRAST
TECHNIQUE: Multidetector CT imaging of the abdomen and pelvis was performed
using the standard protocol following bolus administration of
intravenous contrast.

[Series 3: axial st · axial · 0.83mm/px · z∈[+996,+1466]mm · 12 of 110 slices shown, 14 images]
[im 8/110  soft-tissue]
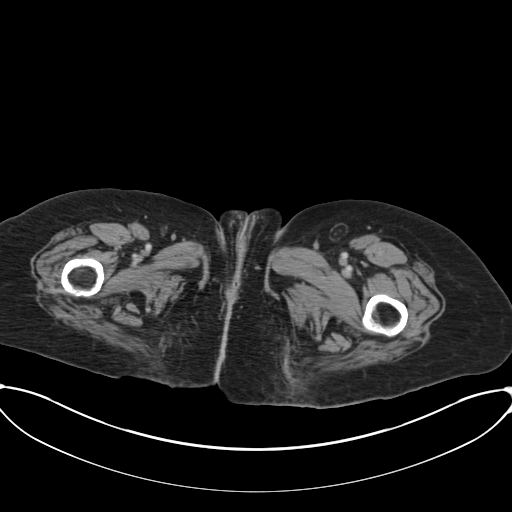
[im 8/110  bone]
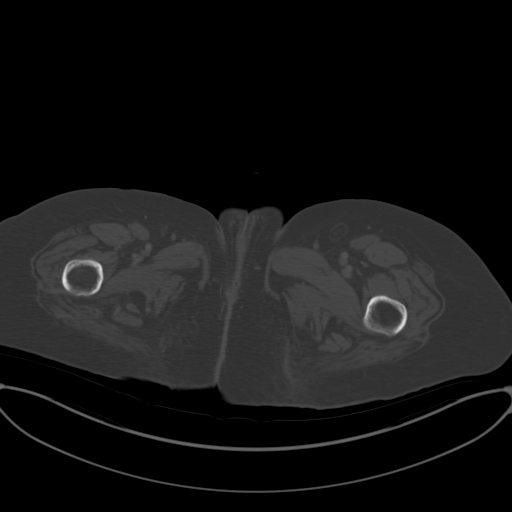
[im 16/110  soft-tissue]
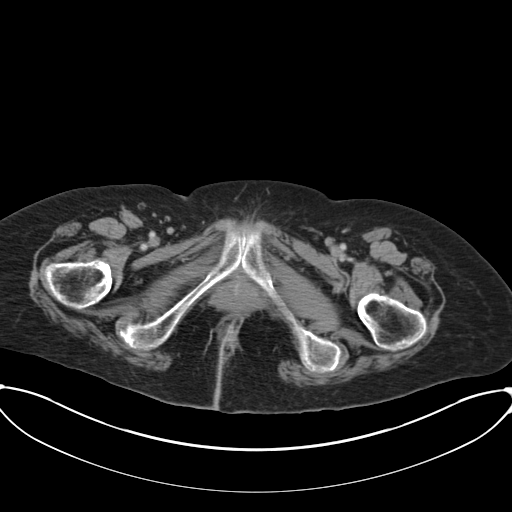
[im 24/110  soft-tissue]
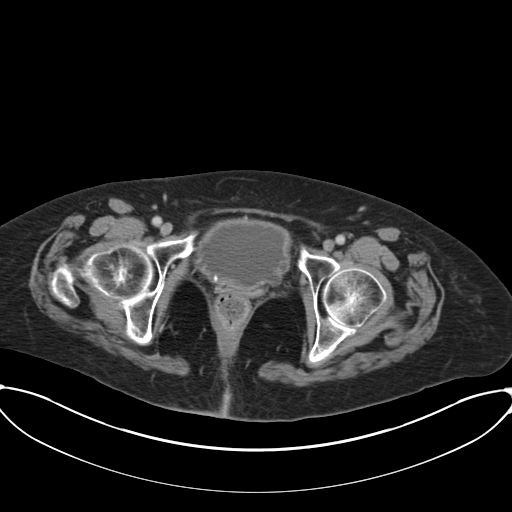
[im 32/110  soft-tissue]
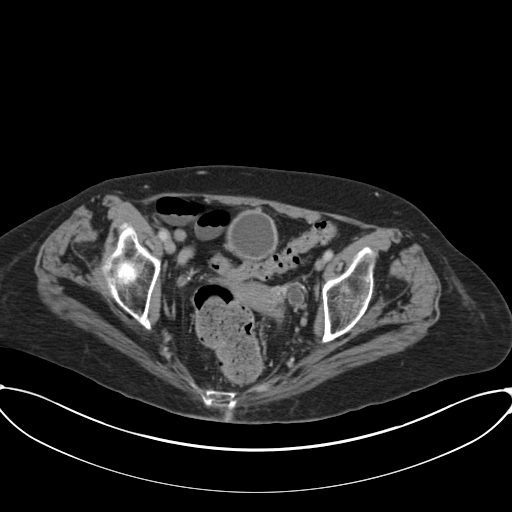
[im 39/110  soft-tissue]
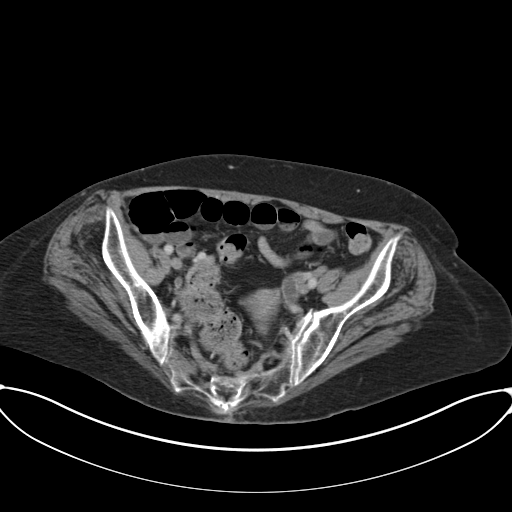
[im 47/110  soft-tissue]
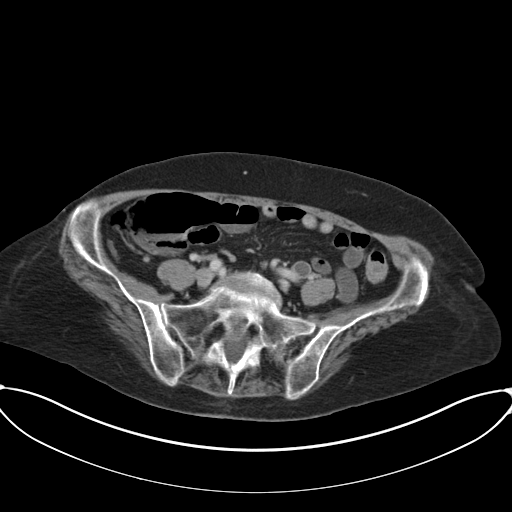
[im 63/110  soft-tissue]
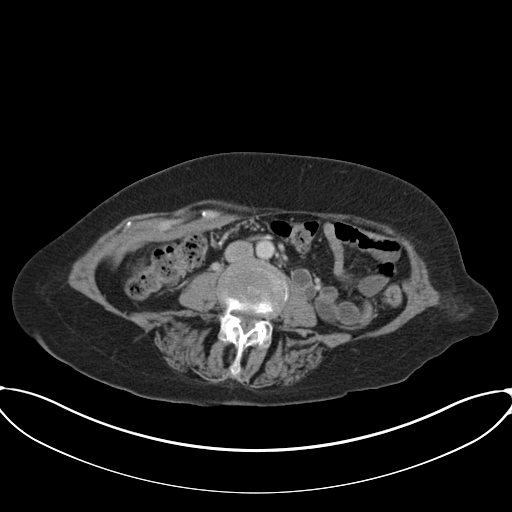
[im 71/110  soft-tissue]
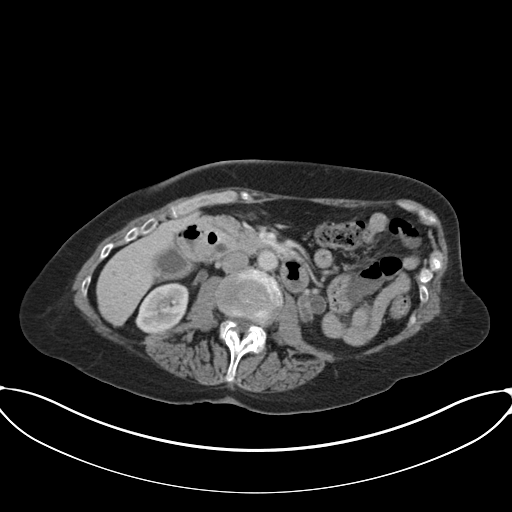
[im 78/110  soft-tissue]
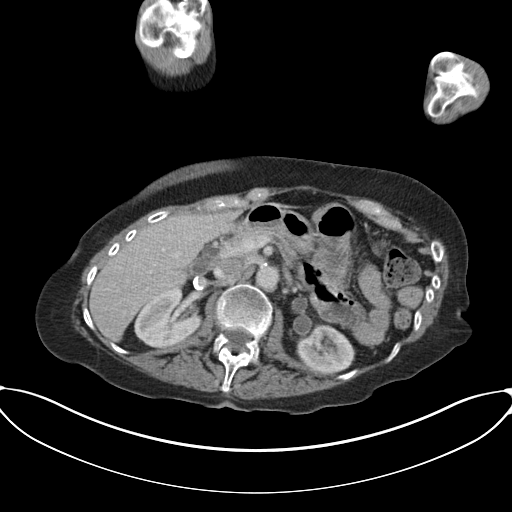
[im 78/110  bone]
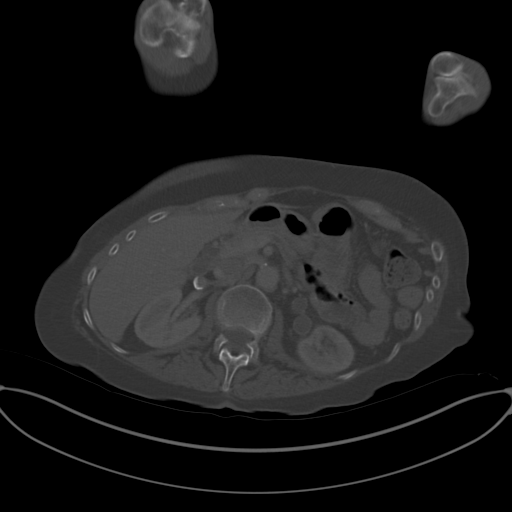
[im 86/110  soft-tissue]
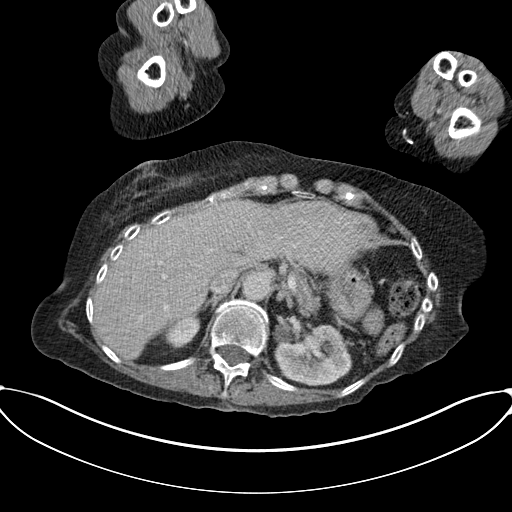
[im 94/110  soft-tissue]
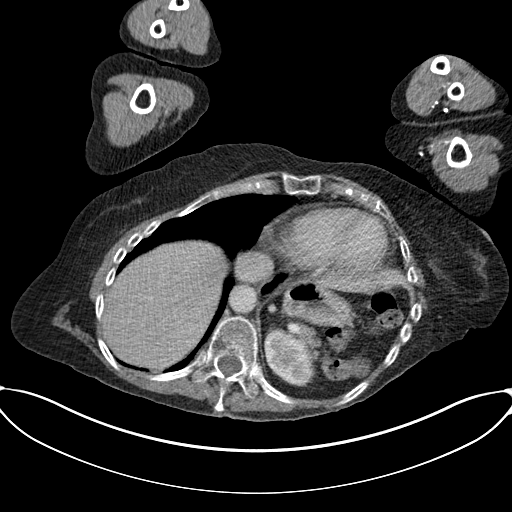
[im 102/110  soft-tissue]
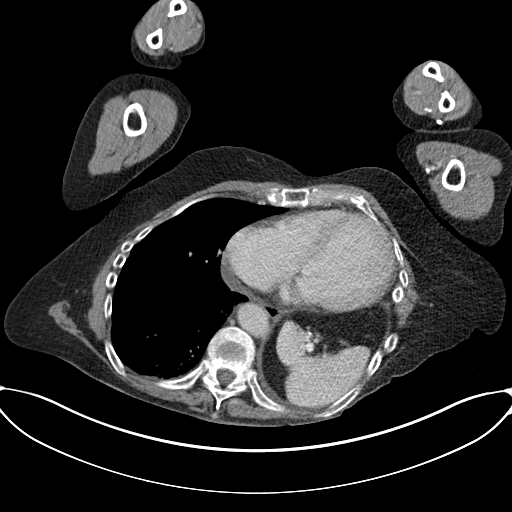

[Series 5: coronal st · coronal · 0.92mm/px · 3 of 111 slices shown]
[im 37/111  soft-tissue]
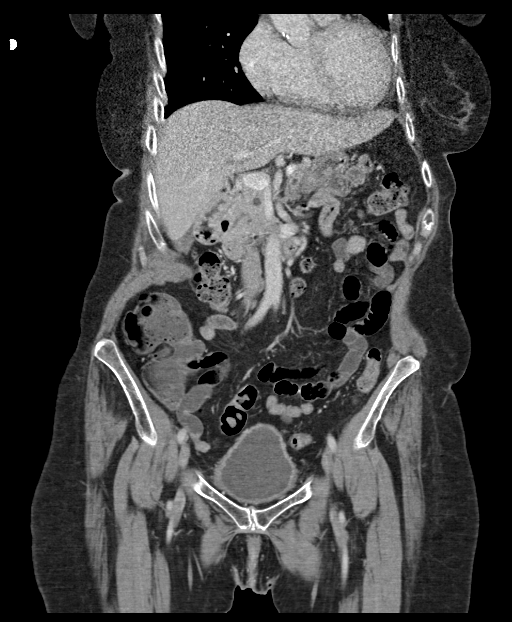
[im 49/111  soft-tissue]
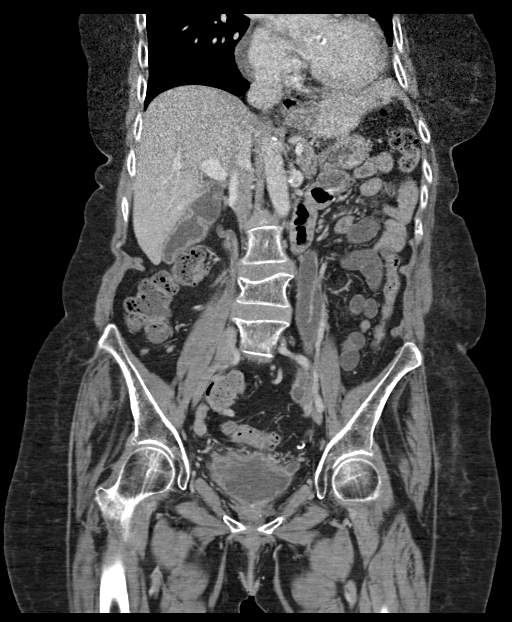
[im 62/111  soft-tissue]
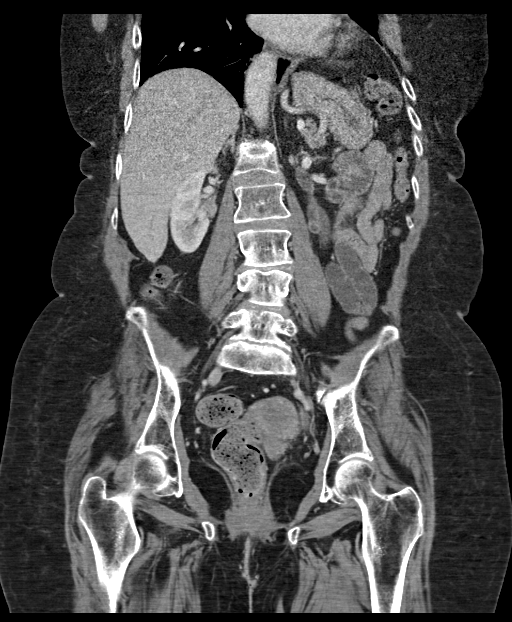

[15 of 46 positions shown; findings below may reference images not displayed]

RADIATION DOSE REDUCTION: This exam was performed according to the
departmental dose-optimization program which includes automated
exposure control, adjustment of the mA and/or kV according to
patient size and/or use of iterative reconstruction technique.

CONTRAST:  80mL OMNIPAQUE IOHEXOL 300 MG/ML  SOLN
FINDINGS: Lower chest: Stable left pleural effusion. Chronic elevation of the
left hemidiaphragm.

Hepatobiliary: No focal liver abnormality is seen. No evidence of
cholelithiasis or gallbladder wall thickening. Bile duct normal for
age.

Pancreas: No acute inflammatory changes. Stable chronic pancreatic
duct dilation.

Spleen: Normal in size without focal abnormality.

Adrenals/Urinary Tract: There is a 7 mm nonobstructing calculus
within the right renal pelvis. There are 2 other punctate less than
3 mm nonobstructing right renal calculi as well. The right kidney
enhances normally.

Partial duplication of the left ureter again noted. There is stable
left hydroureter, with mild diffuse enhancement of the left ureteral
mucosa. This could reflect underlying infection. Please correlate
with urinalysis. No urinary tract calculi.

Bladder is decompressed. 5 mm nonobstructing calculus layers
dependently within the right-side of the bladder lumen.

Stomach/Bowel: No bowel obstruction or ileus. Normal appendix right
lower quadrant. No bowel wall thickening or inflammatory change.

Vascular/Lymphatic: Aortic atherosclerosis. No enlarged abdominal or
pelvic lymph nodes.

Reproductive: Uterus and bilateral adnexa are unremarkable.

Other: No free fluid or free intraperitoneal gas. No abdominal wall
hernia.

Musculoskeletal: No acute or destructive bony lesions. Reconstructed
images demonstrate stable chronic compression deformities at T10,
L1, L4, and L5.
IMPRESSION: 1. Nonobstructing right renal and bladder calculi as above.
2. Chronic left hydroureter, with diffuse left ureteral mucosal
enhancement. Findings could reflect underlying infection. Please
correlate with urinalysis.
3. Stable left pleural effusion.
4.  Aortic Atherosclerosis ([id number]-ZB2.2).

## 2023-09-27 IMAGING — CT CT HEAD CODE STROKE
3 series · 15 of 47 positions shown, 18 images · non-contrast
Comparison: 03/22/2020

CLINICAL DATA: Code stroke. Neuro deficit, acute, stroke suspected.



[Series 3: head 5.0 st · axial · 0.45mm/px · z∈[+1140,+1275]mm · 9 of 33 slices shown, 12 images]
[im 3/33  brain]
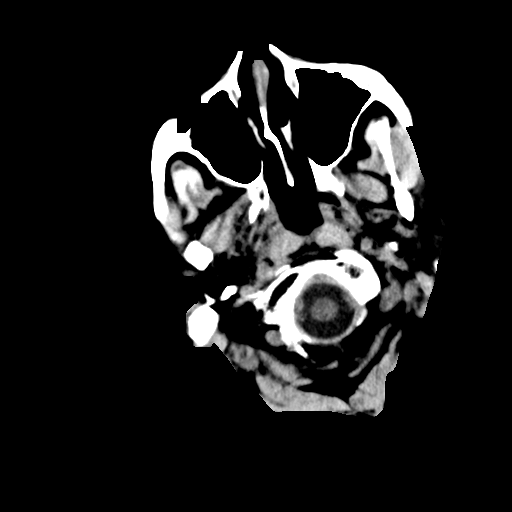
[im 3/33  bone]
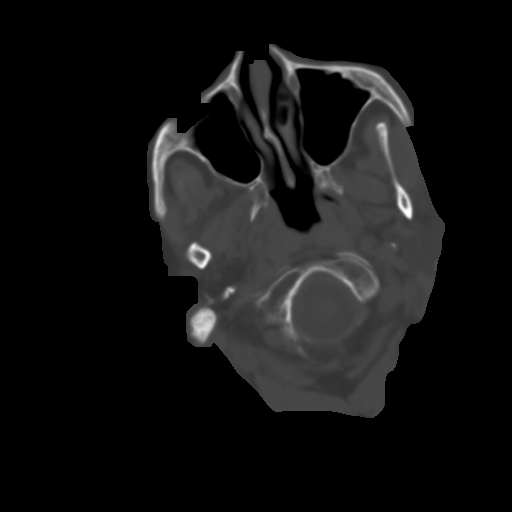
[im 6/33  brain]
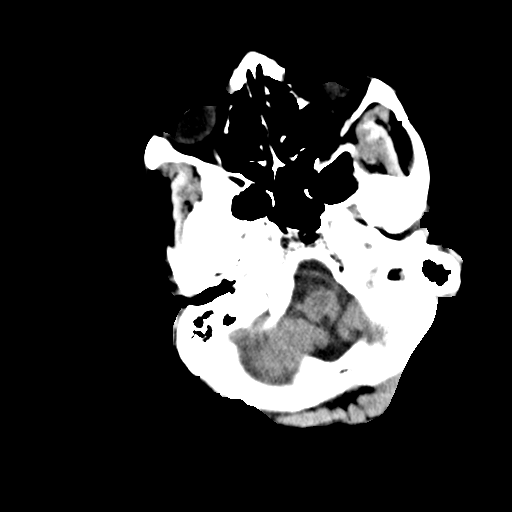
[im 9/33  brain]
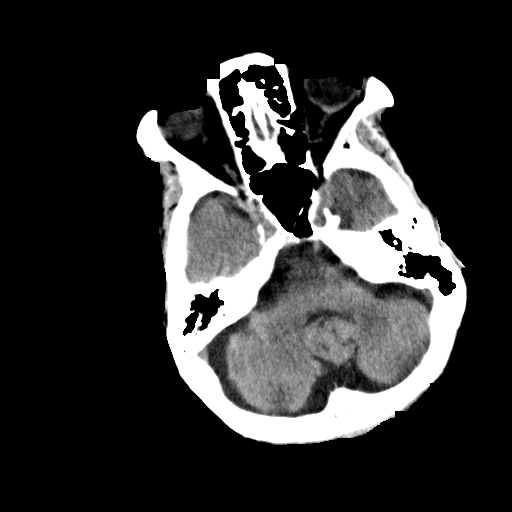
[im 13/33  brain]
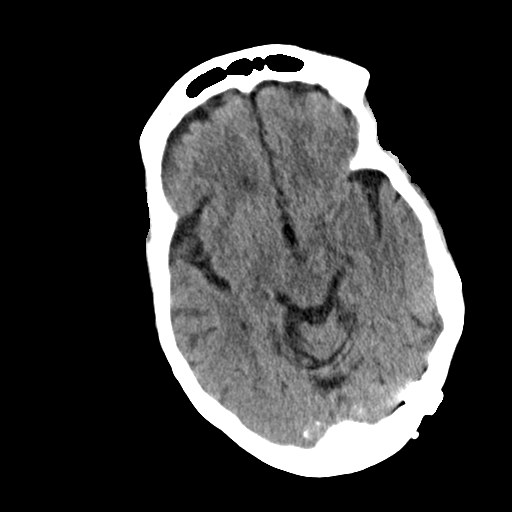
[im 17/33  brain]
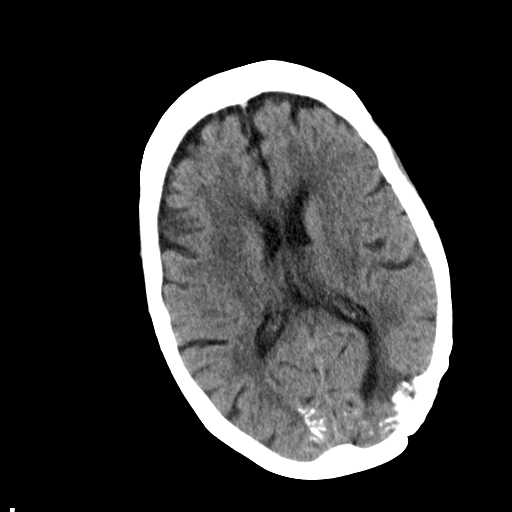
[im 17/33  bone]
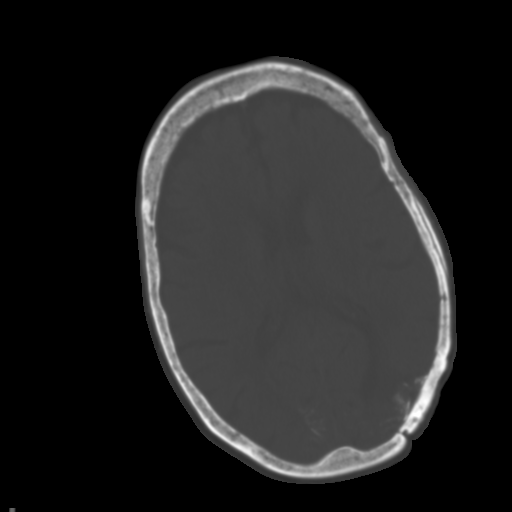
[im 20/33  brain]
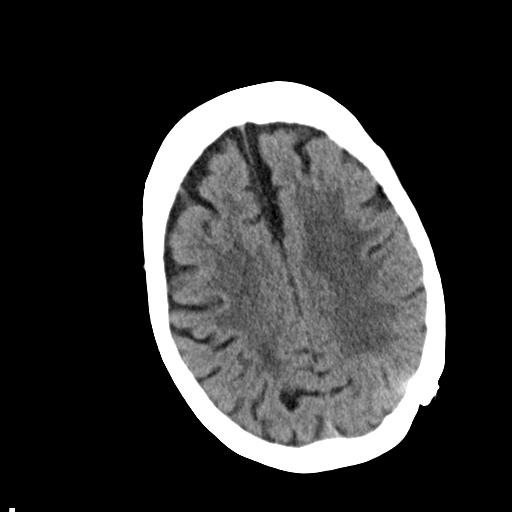
[im 24/33  brain]
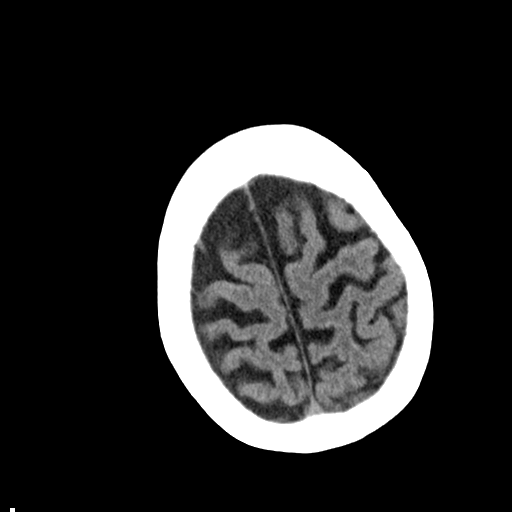
[im 27/33  brain]
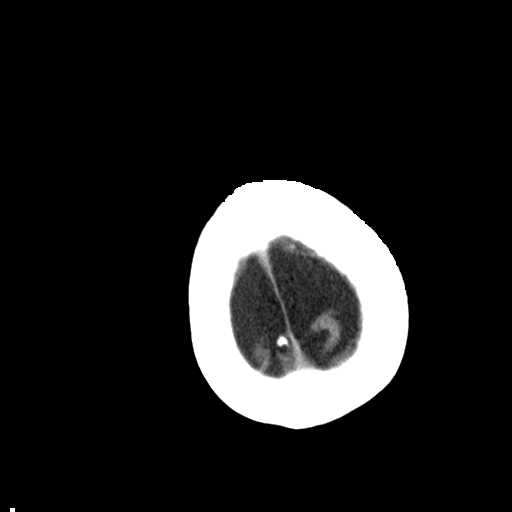
[im 30/33  brain]
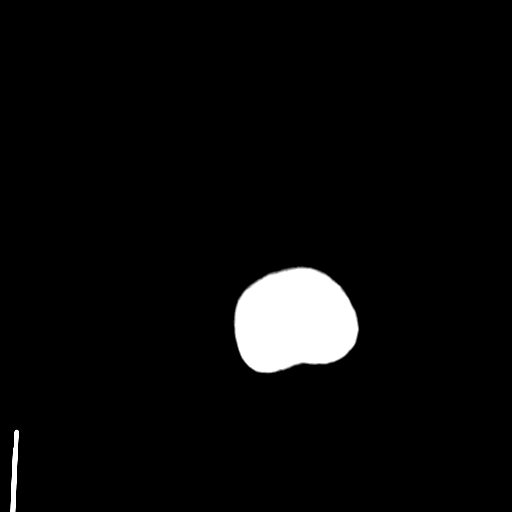
[im 30/33  bone]
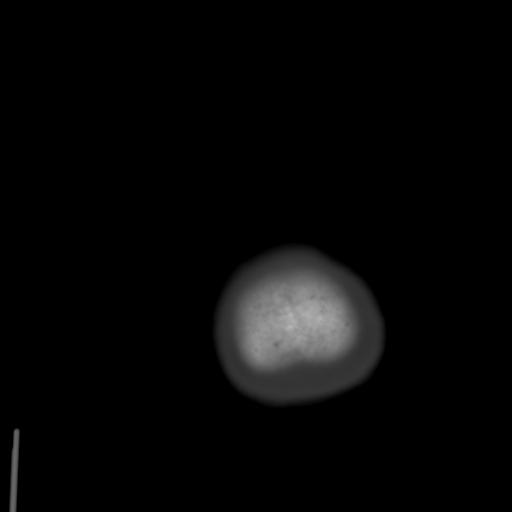

[Series 5: head 3.0 cor st · coronal · 0.33mm/px · 3 of 69 slices shown]
[im 25/69  brain]
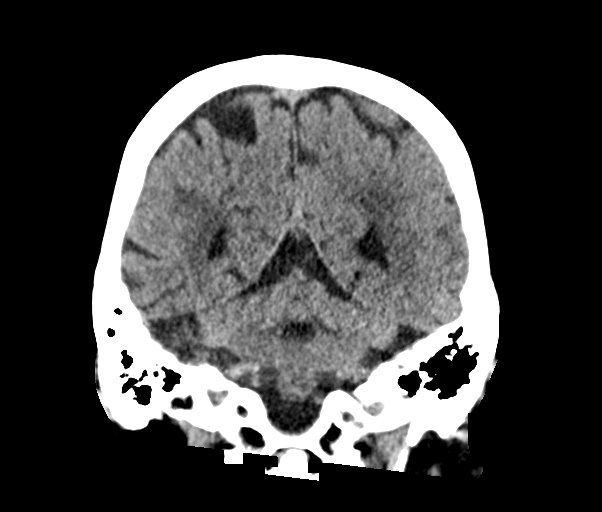
[im 31/69  brain]
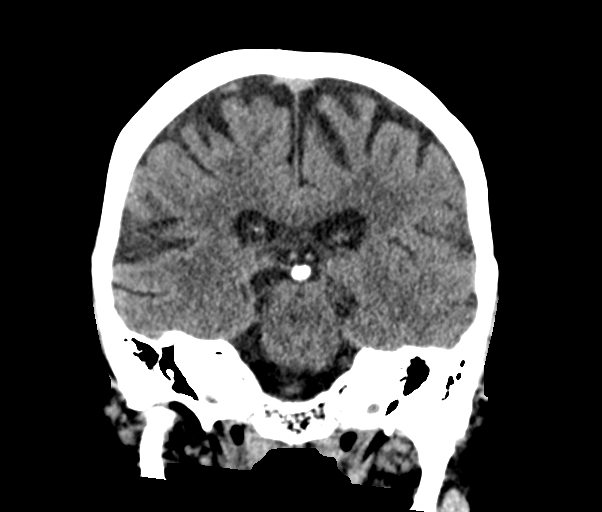
[im 38/69  brain]
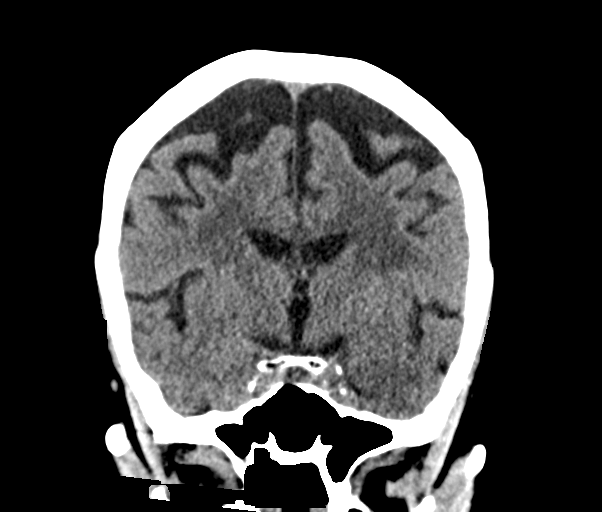

[Series 6: head 3.0 sag st · sagittal · 0.32mm/px · 3 of 66 slices shown]
[im 27/66  brain]
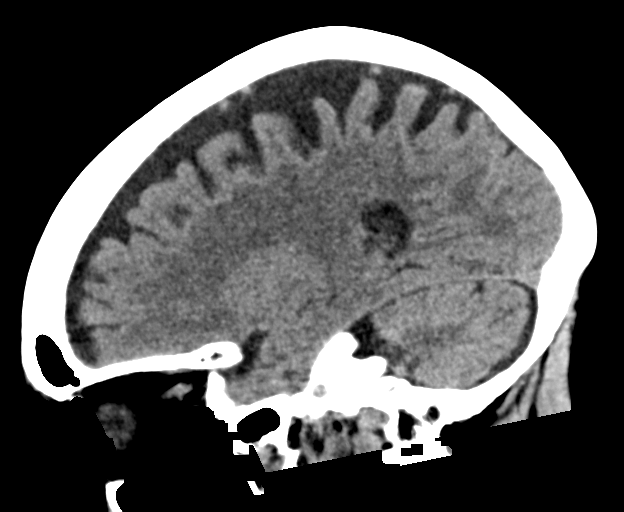
[im 34/66  brain]
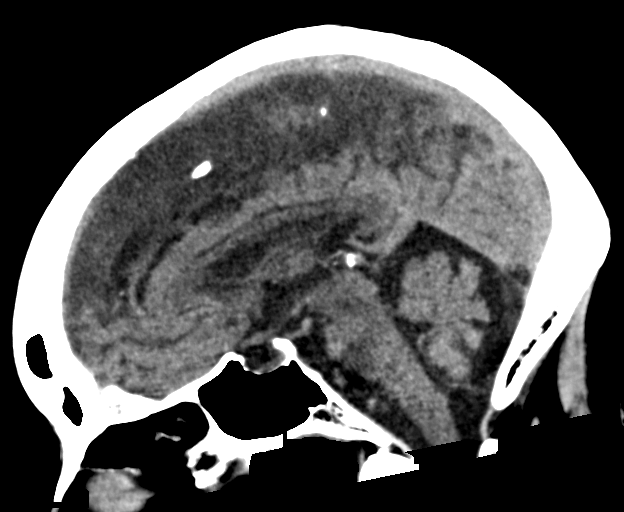
[im 41/66  brain]
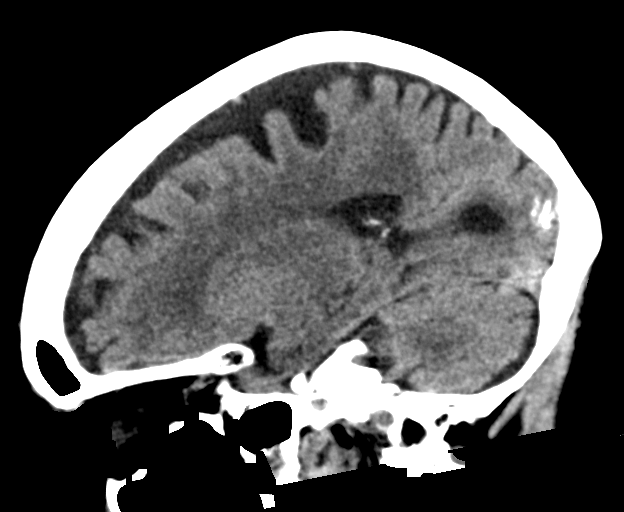

[15 of 47 positions shown; findings below may reference images not displayed]

FINDINGS: Brain: No acute finding or change since the prior exam. There is
cerebellar atrophy without focal insult. Patient has had previous
left parieto-occipital craniotomy. There is volume loss affecting
the cortex in both posterior parietal/occipital regions, more
extensive on the left than the right, with laminar necrosis and
cortical calcification. There chronic small-vessel ischemic changes
elsewhere affecting the cerebral hemispheric white matter.

Vascular: There is atherosclerotic calcification of the major
vessels at the base of the brain.

Skull: Otherwise negative

Sinuses/Orbits: Clear/normal

Other: None

ASPECTS (Alberta Stroke Program Early CT Score)

- Ganglionic level infarction (caudate, lentiform nuclei, internal
capsule, insula, M1-M3 cortex): 7

- Supraganglionic infarction (M4-M6 cortex): 3

Total score (0-10 with 10 being normal): 10
IMPRESSION: 1. No acute CT finding. Distant left posterior parietal craniotomy.
Old bilateral parieto-occipital cortical infarctions with laminar
necrosis and calcification.
2. ASPECTS is 10
3. These results were communicated to Dr. Ascer at [DATE] on
10/01/2021 by text page via the [employer] messaging system.

## 2023-09-27 IMAGING — DX DG CHEST 1V PORT
1 series · 1 of 1 positions shown · non-contrast
Comparison: Chest x-ray 08/22/2021, CT chest 09/12/2021

CLINICAL DATA: concern for pna

EXAM:
PORTABLE CHEST 1 VIEW

[chest]
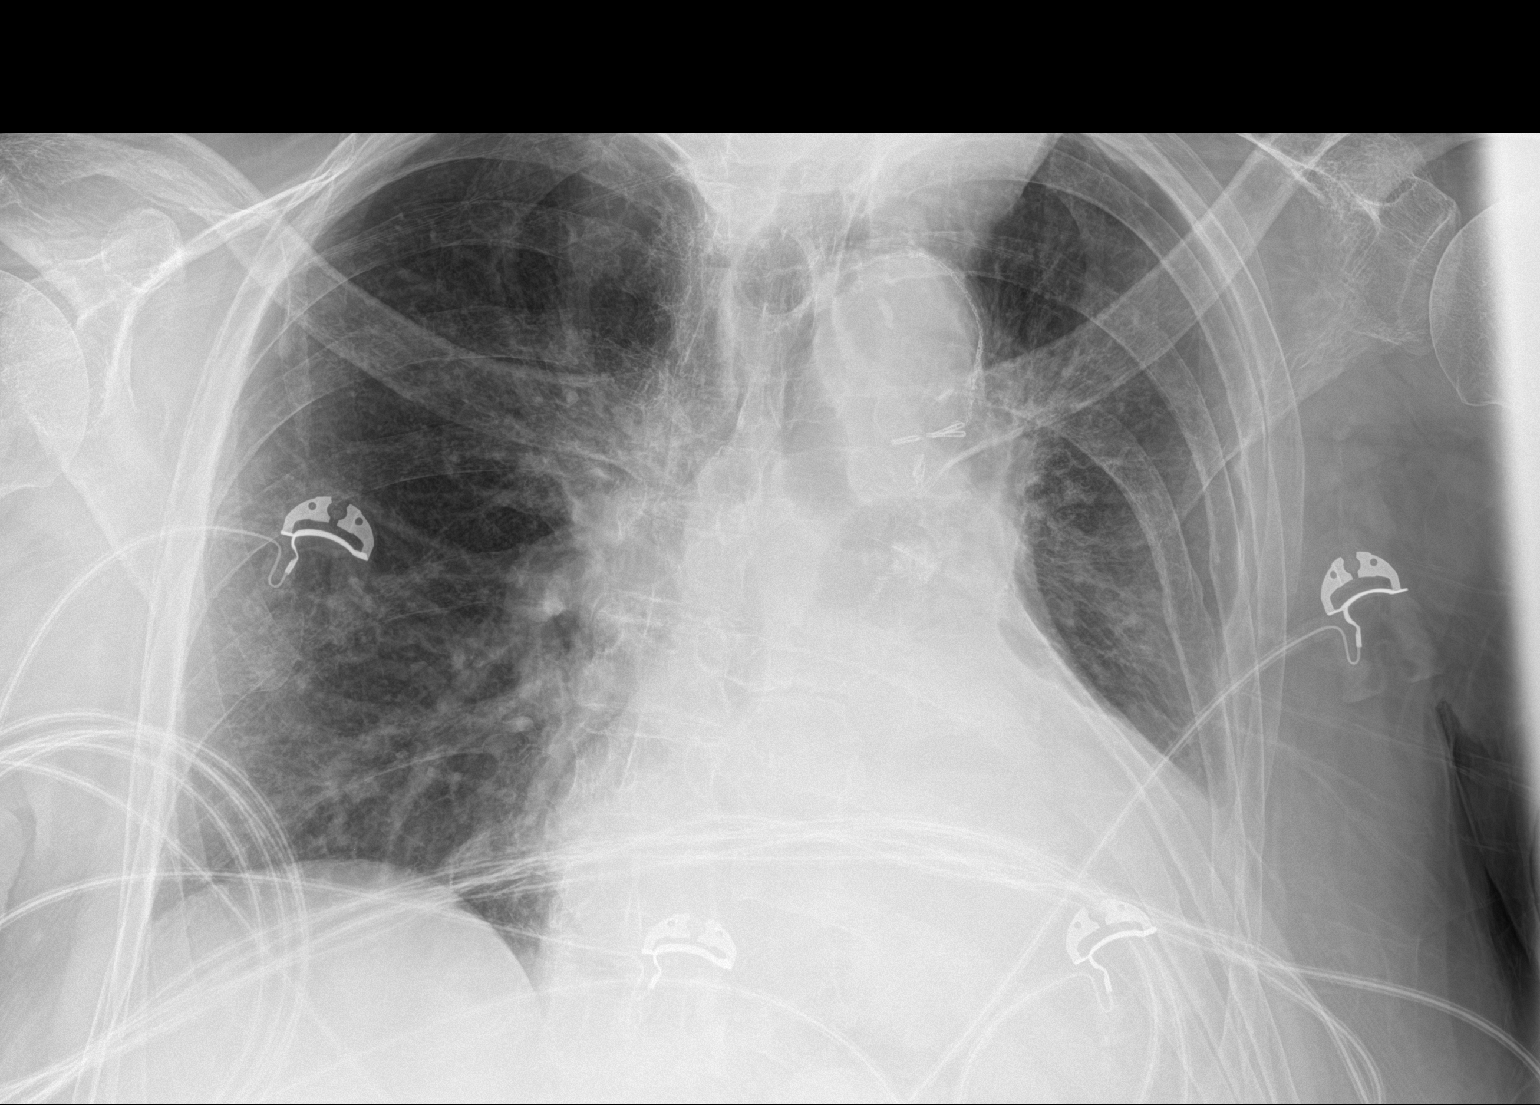

[1 of 1 positions shown; findings below may reference images not displayed]

FINDINGS: The heart and mediastinal contours are within normal limits.
Bilateral hilar prominence consistent with known peribronchovascular
consolidation and fibrosis best visualized on CT chest 09/12/2021.
Aortic calcification.

Retrocardiac airspace opacity. No pulmonary edema. Persistent at
least trace volume left pleural effusion. No pneumothorax.

No acute osseous abnormality.  Old healed left rib fractures.
IMPRESSION: 1. Retrocardiac airspace opacity and persistent at least trace
volume left pleural effusion. Followup PA and lateral chest X-ray is
recommended in 3-4 weeks following therapy to ensure resolution.
2.  Aortic Atherosclerosis ([id number].O).

## 2023-09-27 IMAGING — MR MR HEAD W/O CM
10 of 11 series · 43 of 48 positions shown · non-contrast
Comparison: Prior CTs from earlier the same day.

CLINICAL DATA: Initial evaluation for acute TIA.

EXAM:
MRI HEAD WITHOUT CONTRAST
TECHNIQUE: Multiplanar, multiecho pulse sequences of the brain and surrounding
structures were obtained without intravenous contrast.

[Series 5: DWI · axial · 3.0mm · 0.88mm/px · z∈[-47,+90]mm · 10 of 96 slices shown (1 of 4)]
[im 1/96]
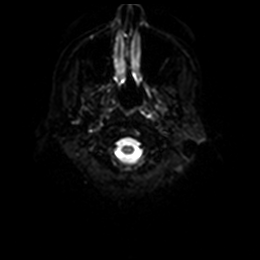
[im 11/96]
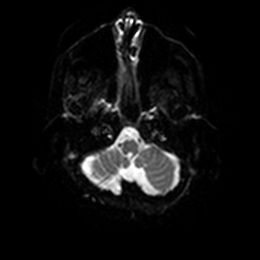
[im 22/96]
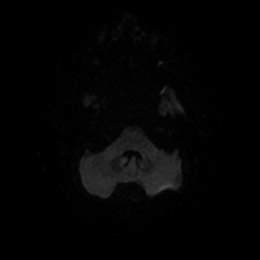
[im 32/96]
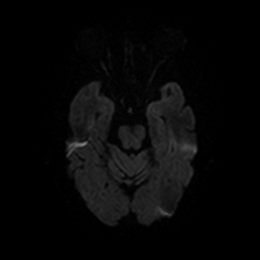
[im 43/96]
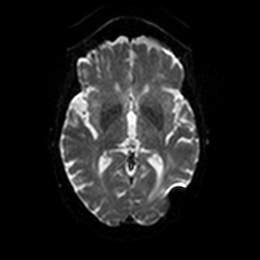
[im 53/96]
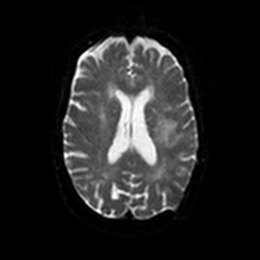
[im 64/96]
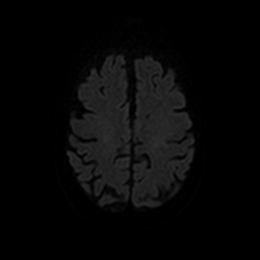
[im 74/96]
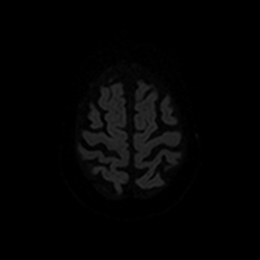
[im 85/96]
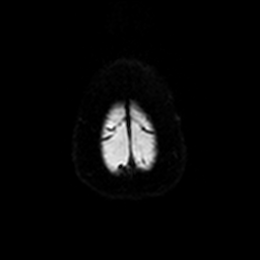
[im 96/96]
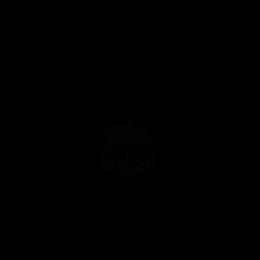

[Series 6: DWI · axial · 3.0mm · 0.88mm/px · z∈[-47,+90]mm · 5 of 48 slices shown (2 of 4)]
[im 1/48]
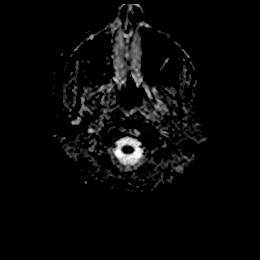
[im 12/48]
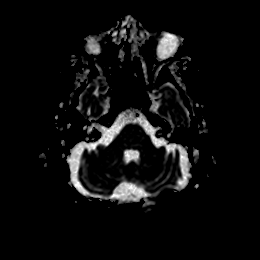
[im 24/48]
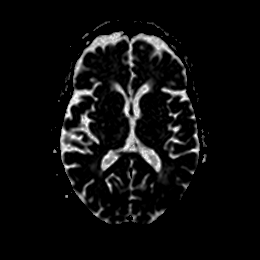
[im 36/48]
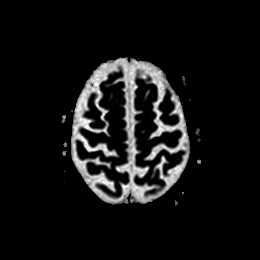
[im 48/48]
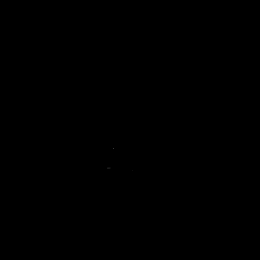

[Series 7: DWI · coronal · 4.0mm · 0.88mm/px · 6 of 66 slices shown (3 of 4)]
[im 1/66]
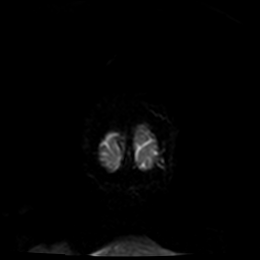
[im 14/66]
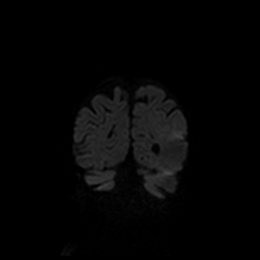
[im 27/66]
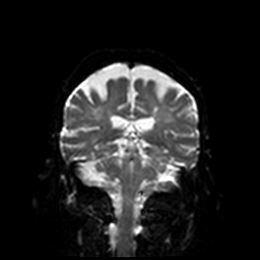
[im 40/66]
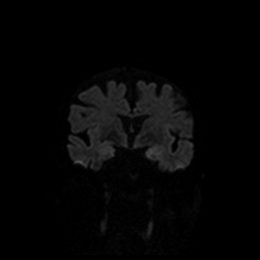
[im 53/66]
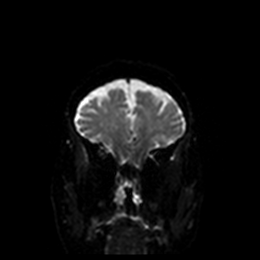
[im 66/66]
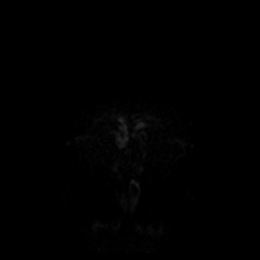

[Series 8: DWI · coronal · 4.0mm · 0.88mm/px · 3 of 33 slices shown (4 of 4)]
[im 1/33]
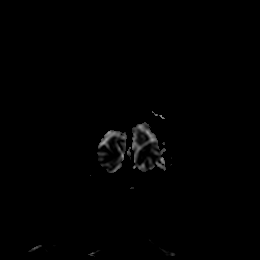
[im 17/33]
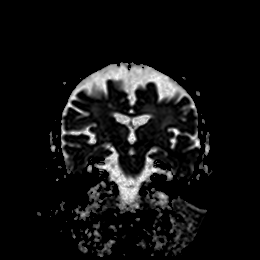
[im 33/33]
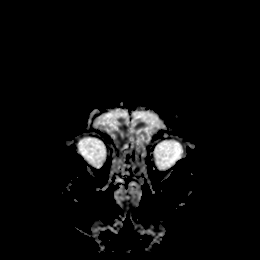

[Series 9: T1 · sagittal · 5.0mm · 0.75mm/px · 2 of 23 slices shown]
[im 1/23]
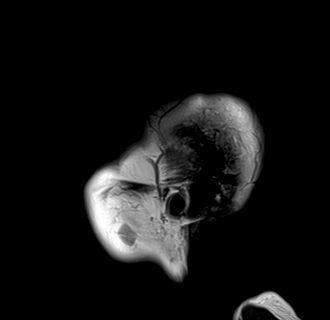
[im 23/23]
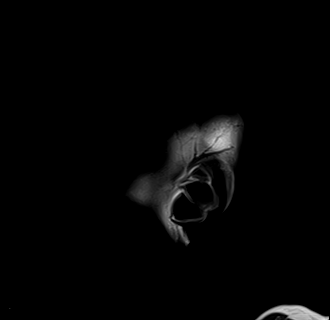

[Series 10: T2 · axial · 5.0mm · 0.72mm/px · z∈[-48,+91]mm · 2 of 25 slices shown (1 of 2)]
[im 1/25]
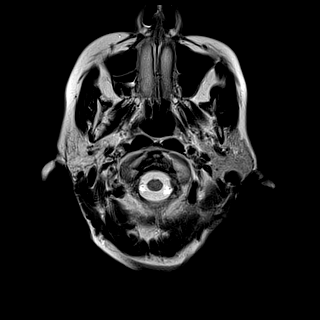
[im 25/25]
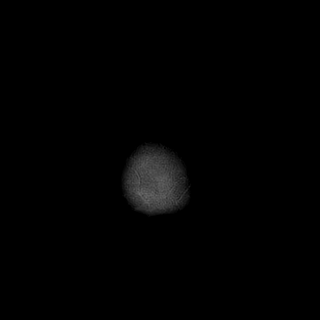

[Series 11: FLAIR · axial · 5.0mm · 0.45mm/px · z∈[-47,+92]mm · 2 of 25 slices shown]
[im 1/25]
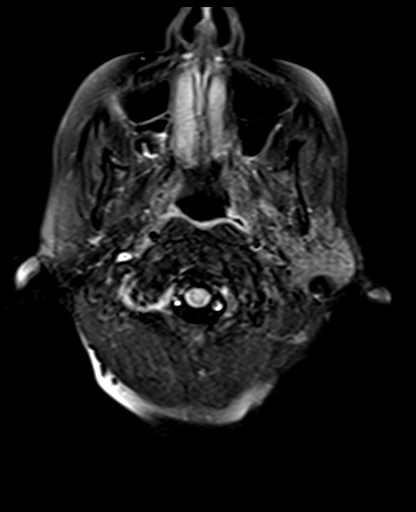
[im 25/25]
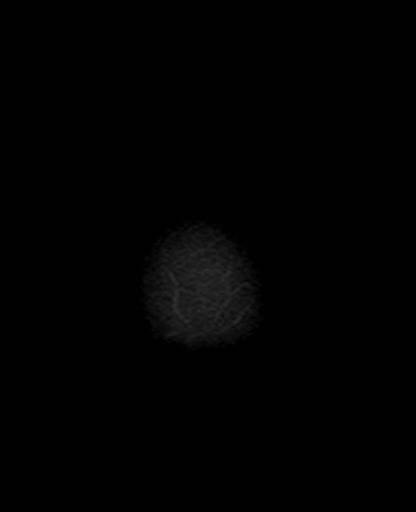

[Series 13: pha_images · axial · 3.0mm · 0.90mm/px · z∈[-63,+109]mm · 5 of 59 slices shown]
[im 1/59]
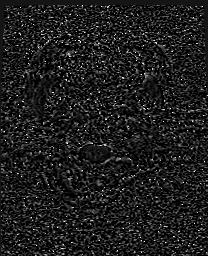
[im 15/59]
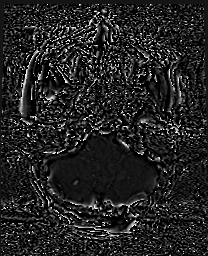
[im 30/59]
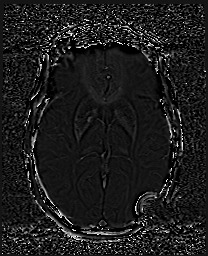
[im 44/59]
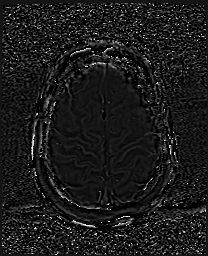
[im 59/59]
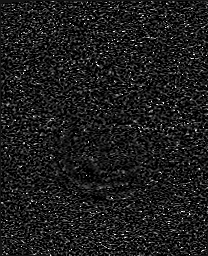

[Series 14: swi_images · axial · 3.0mm · 0.90mm/px · z∈[-63,+109]mm · 5 of 60 slices shown]
[im 1/60]
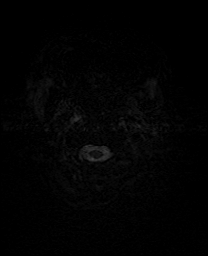
[im 15/60]
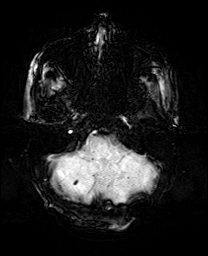
[im 30/60]
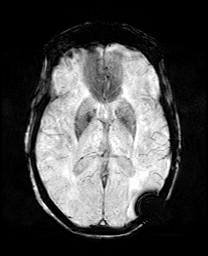
[im 45/60]
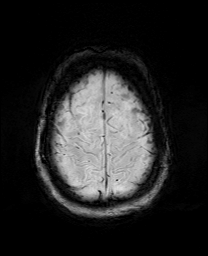
[im 60/60]
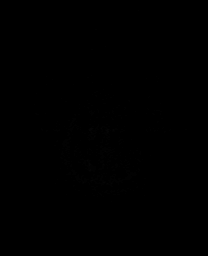

[Series 17: T2 · coronal · 5.0mm · 0.34mm/px · 3 of 29 slices shown (2 of 2)]
[im 1/29]
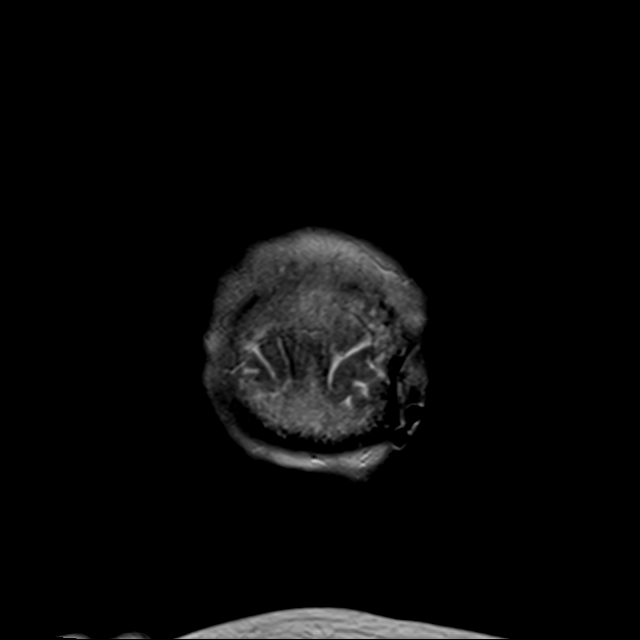
[im 15/29]
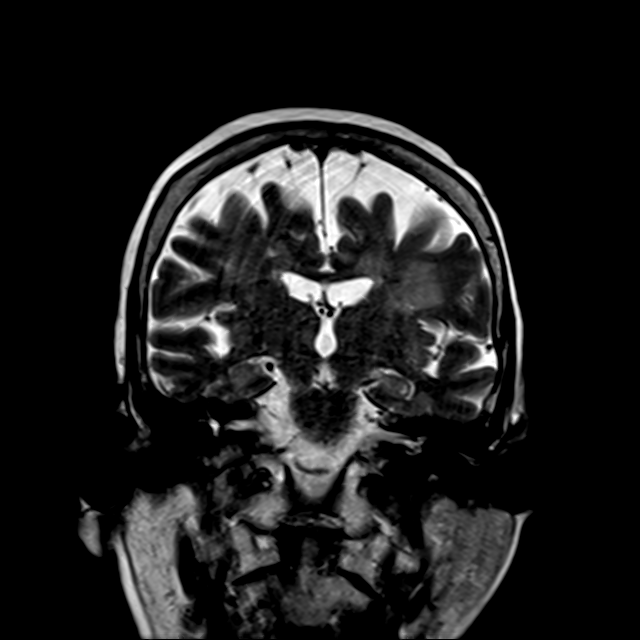
[im 29/29]
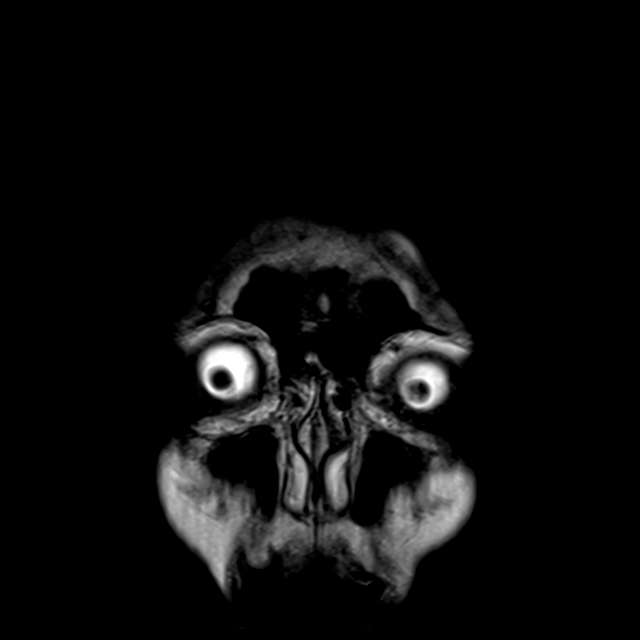

[43 of 48 positions shown; findings below may reference images not displayed]

FINDINGS: Brain: Diffuse prominence of the CSF containing spaces compatible
generalized cerebral atrophy. Patchy and confluent T2/FLAIR
hyperintensity involving the periventricular deep white matter both
cerebral hemispheres as well as the pons, compatible with chronic
microvascular ischemic disease, moderately advanced in nature. Few
associated remote lacunar infarcts present about the hemispheric
cerebral white matter. Remote bilateral parieto-occipital infarcts
with associated laminar necrosis noted, better seen on prior CT.
Small remote right cerebellar infarct noted.

No evidence for acute or subacute ischemia. No acute intracranial
hemorrhage. Few scattered chronic micro hemorrhages noted involving
the supratentorial the vertebral brain, likely hypertensive in
nature.

No mass lesion, midline shift or mass effect. No hydrocephalus or
extra-axial fluid collection. Pituitary gland suprasellar region
normal. Midline structures intact.

Vascular: Major intracranial vascular flow voids are maintained.

Skull and upper cervical spine: Craniocervical junction within
normal limits. Sequelae of prior left occipital craniotomy. No scalp
soft tissue abnormality.

Sinuses/Orbits: Globes and orbital soft tissues within normal
limits. Mild scattered mucosal thickening noted within the ethmoidal
air cells. Paranasal sinuses are otherwise clear. Trace right
mastoid effusion, of doubtful significance.

Other: None.
IMPRESSION: 1. No acute intracranial abnormality.
2. Remote bilateral parieto-occipital infarct with associated
laminar necrosis, better seen on prior CT.
3. Moderately advanced chronic microvascular ischemic disease, with
a few remote lacunar infarcts about the hemispheric cerebral white
matter and cerebellum.

## 2023-09-27 IMAGING — CT CT ANGIO HEAD-NECK (W OR W/O PERF)
2 of 7 series · 9 of 35 positions shown · IV contrast (OMNI 350)
Comparison: Soft tissue neck CT 12/29/2019.  Chest CT 09/12/2021.

CLINICAL DATA: Stroke.

EXAM:
CT ANGIOGRAPHY HEAD AND NECK
TECHNIQUE: Multidetector CT imaging of the head and neck was performed using
the standard protocol during bolus administration of intravenous
contrast. Multiplanar CT image reconstructions and MIPs were
obtained to evaluate the vascular anatomy. Carotid stenosis
measurements (when applicable) are obtained utilizing NASCET
criteria, using the distal internal carotid diameter as the
denominator.

[Series 7: cta neck axial · axial · 0.39mm/px · z∈[-363,-114]mm · 7 of 373 slices shown]
[im 47/373  soft-tissue]
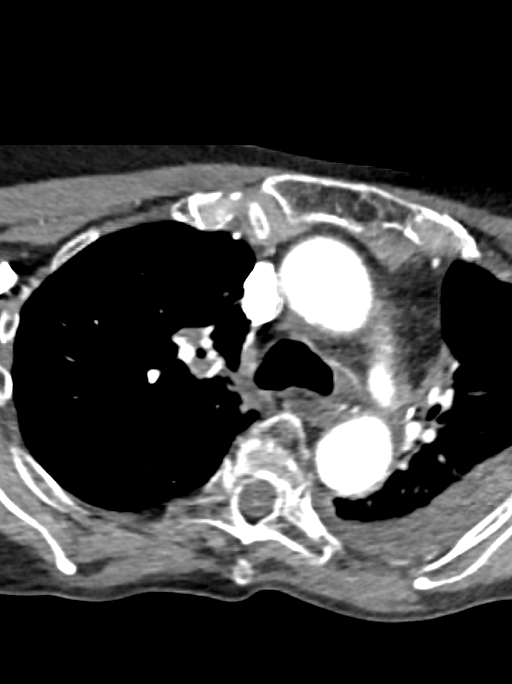
[im 94/373  bone]
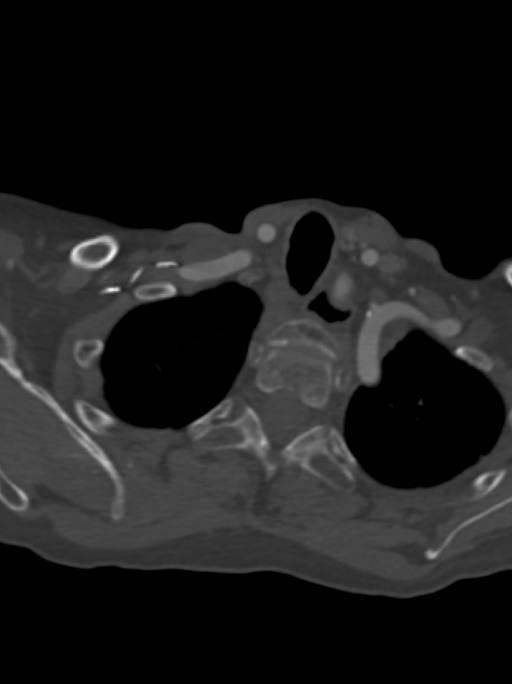
[im 140/373  soft-tissue]
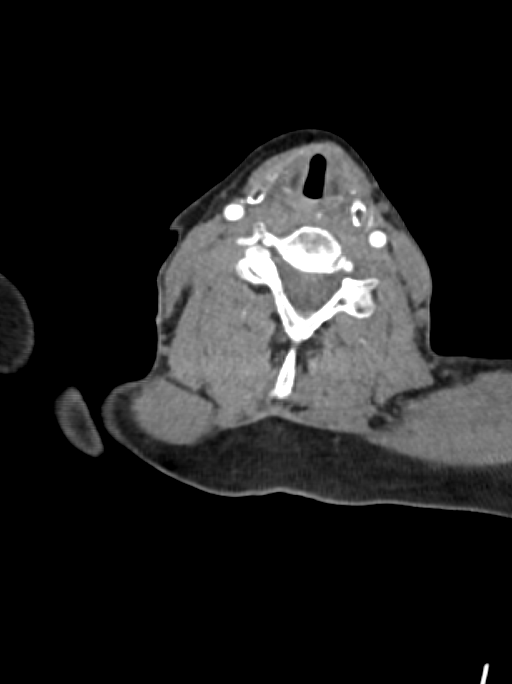
[im 187/373  bone]
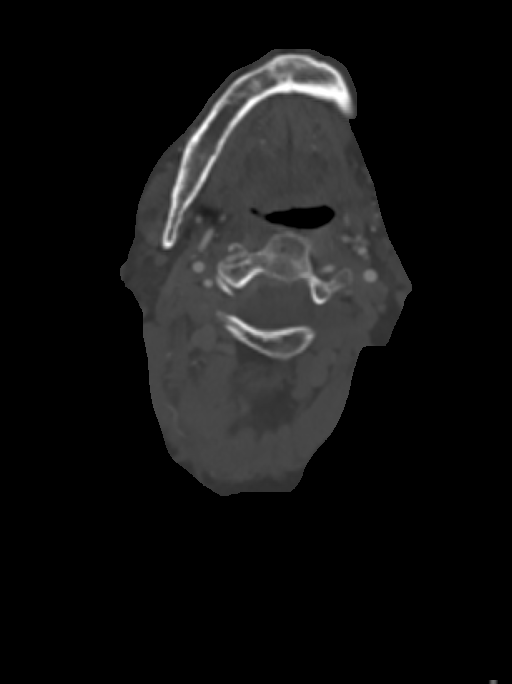
[im 233/373  soft-tissue]
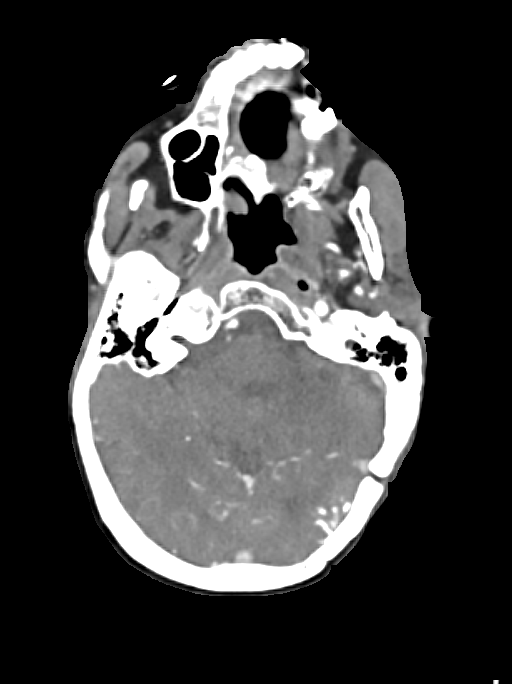
[im 280/373  bone]
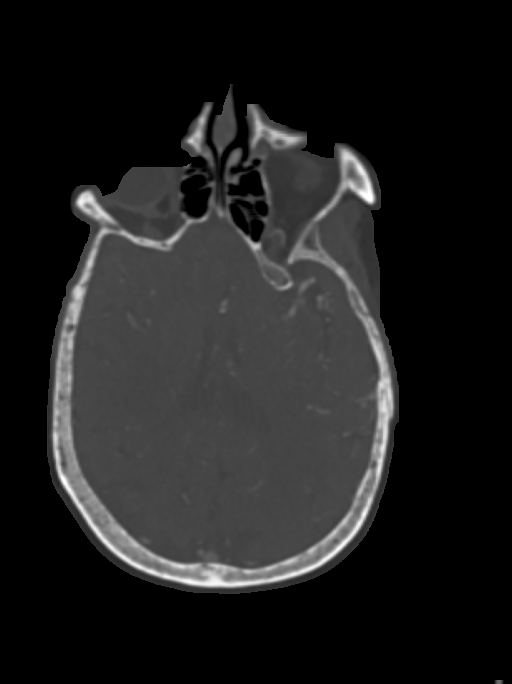
[im 326/373  soft-tissue]
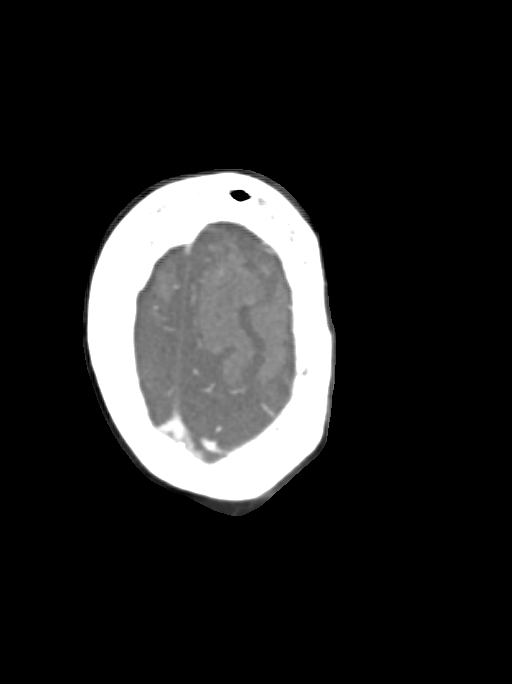

[Series 9: cta neck sagittal · sagittal · 0.51mm/px · 2 of 201 slices shown]
[im 26/201  soft-tissue]
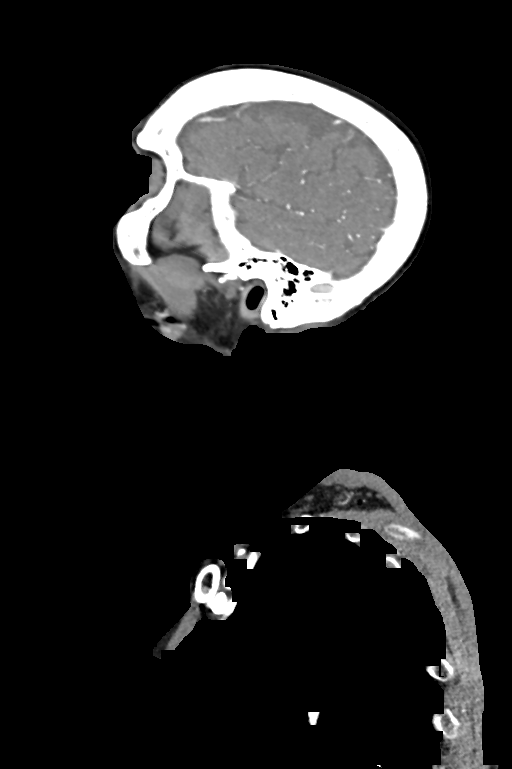
[im 176/201  soft-tissue]
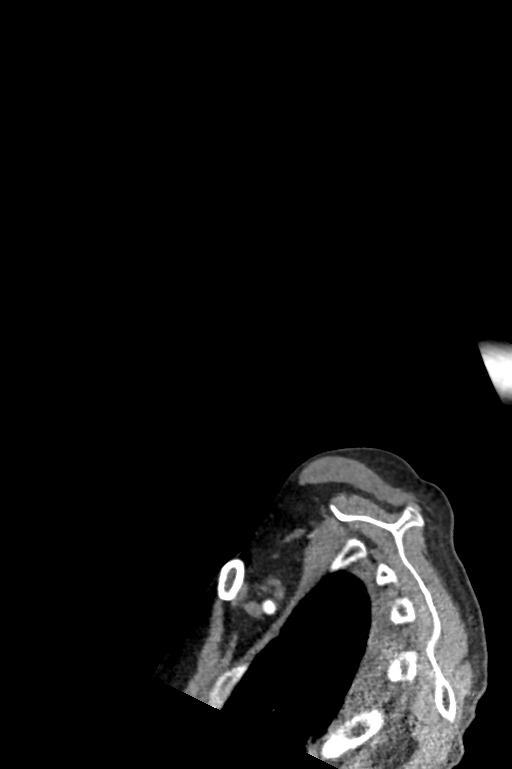

[9 of 35 positions shown; findings below may reference images not displayed]

RADIATION DOSE REDUCTION: This exam was performed according to the
departmental dose-optimization program which includes automated
exposure control, adjustment of the mA and/or kV according to
patient size and/or use of iterative reconstruction technique.

CONTRAST:  75mL OMNIPAQUE IOHEXOL 350 MG/ML SOLN
FINDINGS: CTA NECK FINDINGS

Aortic arch: Standard 3 vessel aortic arch with mild atherosclerotic
plaque. No significant arch vessel origin stenosis.

Right carotid system: Patent with a small amount of calcified plaque
at the carotid bifurcation. No evidence of a significant stenosis or
dissection.

Left carotid system: Patent with minimal calcified plaque at the
carotid bifurcation. No evidence of a significant stenosis or
dissection.

Vertebral arteries: Patent and codominant without evidence a
significant stenosis or dissection.

Skeleton: No acute osseous abnormality or suspicious osseous lesion.

Other neck: Fatty replacement of the right parotid gland. Status
post right hemithyroidectomy. No evidence of cervical
lymphadenopathy.

Upper chest: Partially visualized small pericardial effusion, left
hemithorax volume loss, and small left pleural effusion as shown on
prior chest CT.

Review of the MIP images confirms the above findings

CTA HEAD FINDINGS

Anterior circulation: The internal carotid arteries are widely
patent from skull base to carotid termini. ACAs and MCAs are patent
without evidence of a proximal branch occlusion or significant
proximal stenosis. No aneurysm is identified.

Posterior circulation: Intracranial vertebral arteries are widely
patent to the basilar. The left PICA and right AICA appear dominant.
Patent SCA origins are seen bilaterally. The basilar artery is
widely patent. There are large posterior communicating arteries
bilaterally with hypoplastic left and absent right P1 segments. Both
PCAs are patent without evidence of a significant proximal stenosis.
No aneurysm is identified.

Venous sinuses: As permitted by contrast timing, patent.

Anatomic variants: Fetal origin of the PCAs.

Review of the MIP images confirms the above findings
IMPRESSION: 1. No large vessel occlusion or significant stenosis in the head or
neck.
2.  Aortic Atherosclerosis ([id number]).

These results were communicated to Dr. Bel at [DATE] on
10/01/2021 by text page via the [employer] messaging system.

## 2023-11-04 ENCOUNTER — Other Ambulatory Visit: Payer: Self-pay | Admitting: Nurse Practitioner

## 2023-11-04 DIAGNOSIS — R102 Pelvic and perineal pain: Secondary | ICD-10-CM

## 2023-11-04 DIAGNOSIS — K6289 Other specified diseases of anus and rectum: Secondary | ICD-10-CM

## 2023-11-07 ENCOUNTER — Ambulatory Visit

## 2023-12-30 IMAGING — CT CT PELVIS W/ CM
2 of 3 series · 15 of 46 positions shown, 17 images · IV contrast (OMNIPAQUE)
Comparison: 09/20/2021

CLINICAL DATA: Rectal pain and diarrhea for 2 weeks. Palpable
abnormality in left perianal region. Prior anal fistula. Metastatic
lung carcinoma.

EXAM:
CT PELVIS WITH CONTRAST
TECHNIQUE: Multidetector CT imaging of the pelvis was performed using the
standard protocol following the bolus administration of intravenous
contrast.

[Series 3: coronal st · coronal · 0.63mm/px · 3 of 161 slices shown]
[im 54/161  soft-tissue]
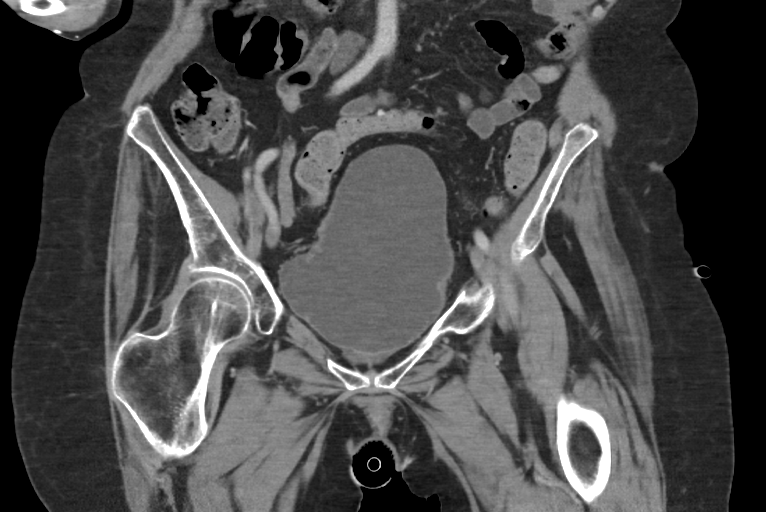
[im 72/161  soft-tissue]
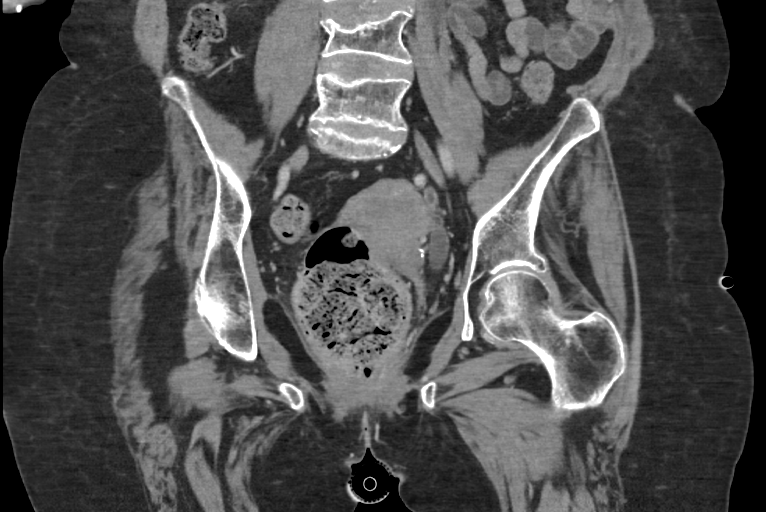
[im 89/161  soft-tissue]
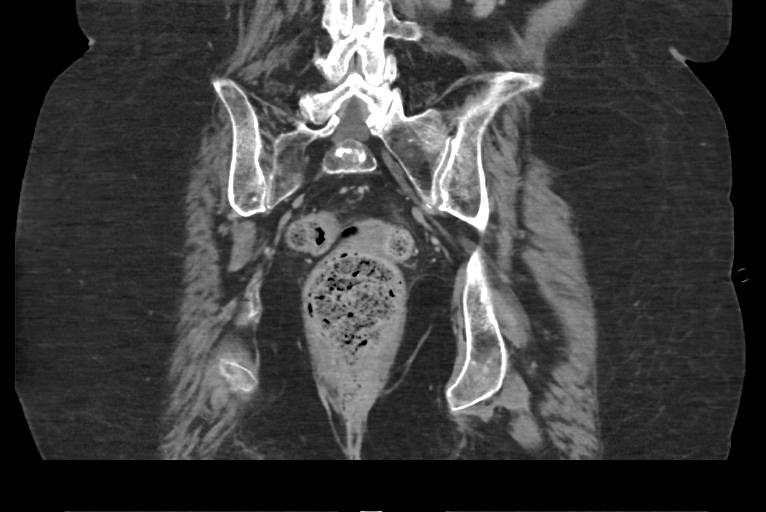

[Series 6: axial st · axial · 0.88mm/px · z∈[-244,+34]mm · 12 of 161 slices shown, 14 images]
[im 11/161  soft-tissue]
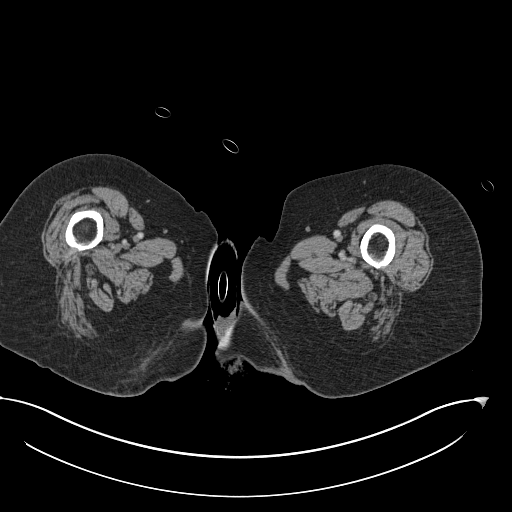
[im 11/161  bone]
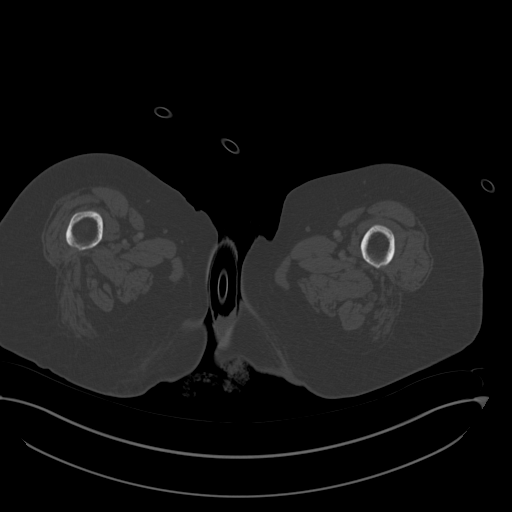
[im 21/161  soft-tissue]
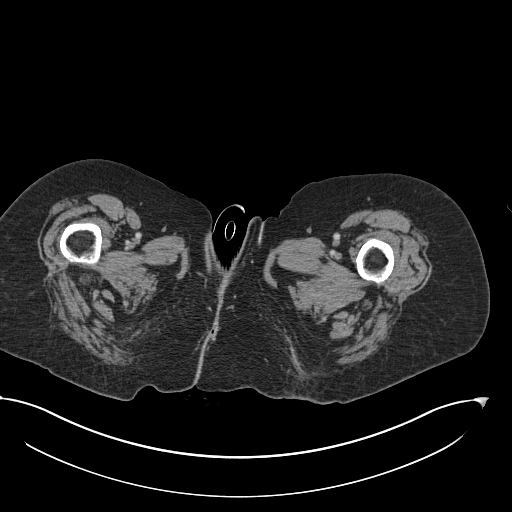
[im 37/161  soft-tissue]
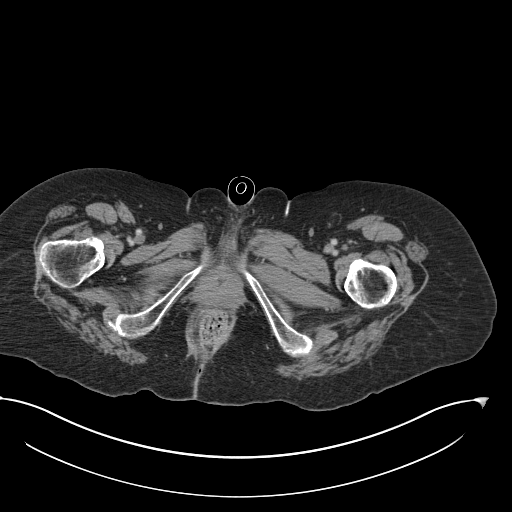
[im 47/161  soft-tissue]
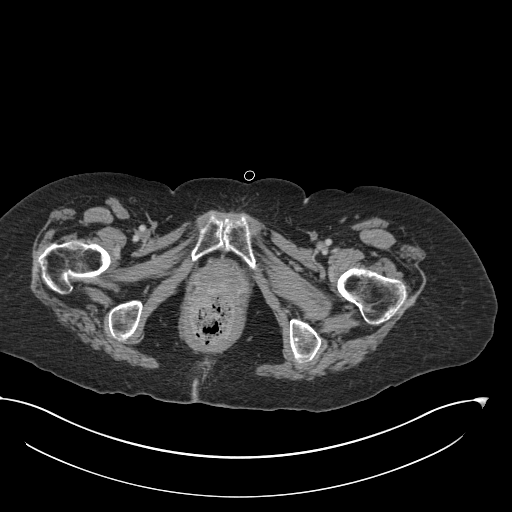
[im 62/161  soft-tissue]
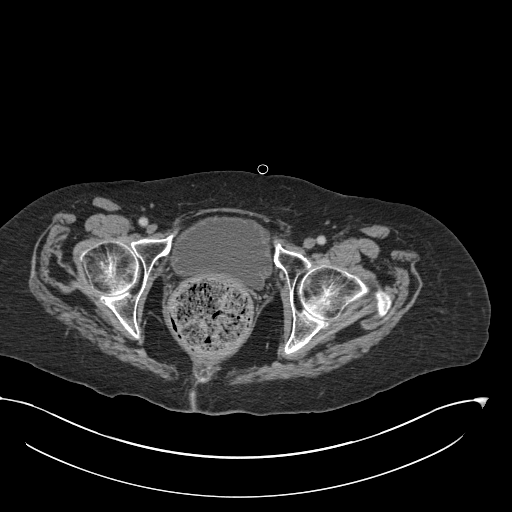
[im 73/161  soft-tissue]
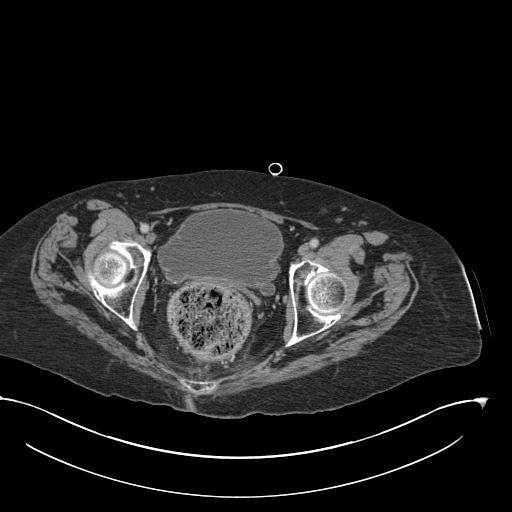
[im 88/161  soft-tissue]
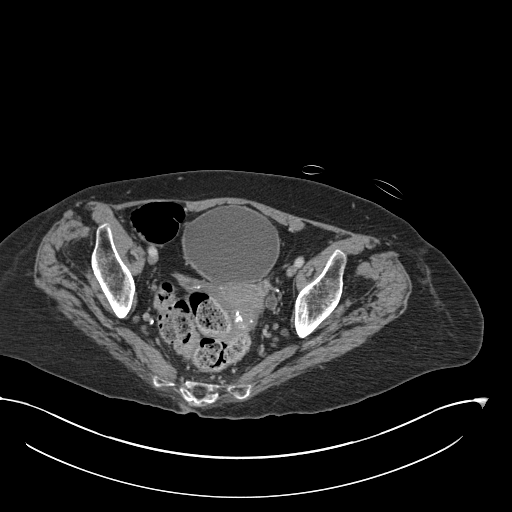
[im 99/161  soft-tissue]
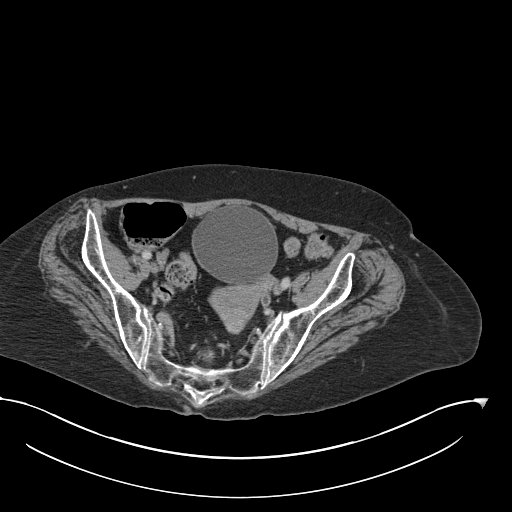
[im 114/161  soft-tissue]
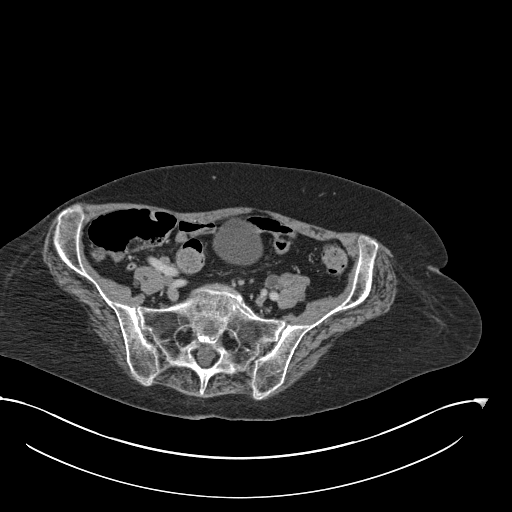
[im 114/161  bone]
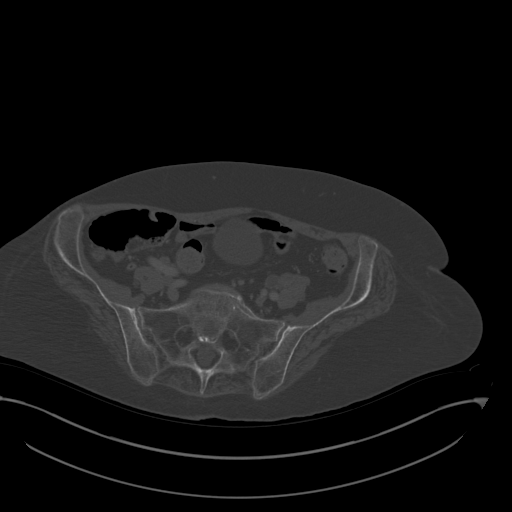
[im 124/161  soft-tissue]
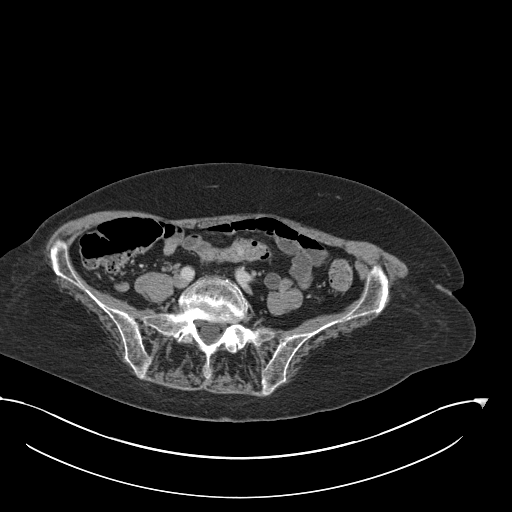
[im 140/161  soft-tissue]
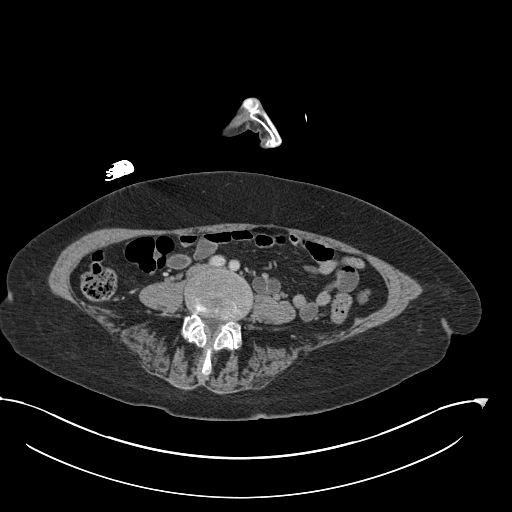
[im 150/161  soft-tissue]
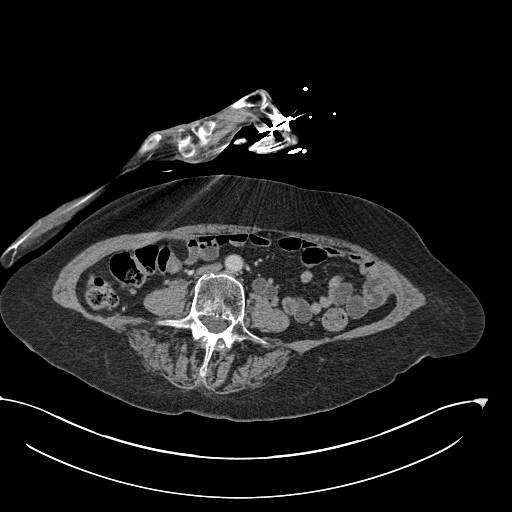

[15 of 46 positions shown; findings below may reference images not displayed]

RADIATION DOSE REDUCTION: This exam was performed according to the
departmental dose-optimization program which includes automated
exposure control, adjustment of the mA and/or kV according to
patient size and/or use of iterative reconstruction technique.

CONTRAST:  75mL OMNIPAQUE IOHEXOL 300 MG/ML  SOLN
FINDINGS: Lower Urinary Tract: Diffuse bladder wall thickening and
trabeculation again noted as well as dilatation of the distal
ureters, right side greater than left. A tiny less than 5 mm bladder
calculus is noted.

Bowel: Large amount of stool is seen in the rectum, and fecal
impaction cannot be excluded. No evidence of bowel wall thickening
or abnormal fluid collections. A linear soft tissue density is seen
in the medial left buttock adjacent to the gluteal crease, extending
inferiorly from the anus. Unchanged compared to prior exam and is
consistent with a perianal fistula. There is no evidence of perianal
abscess or soft tissue mass.

Vascular/Lymphatic: No pathologically enlarged lymph nodes or other
significant abnormality.

Reproductive: Partially calcified posterior uterine fibroid is again
seen measuring 3 cm in diameter. No adnexal masses are identified.

Other: None.

Musculoskeletal: No suspicious bone lesions identified. Old L4
vertebral body compression fracture is again noted.
IMPRESSION: Stable linear soft tissue density in medial left buttock ,
consistent with a perianal fistula. No evidence of perianal abscess,
soft tissue mass, or other acute findings.

Large amount of stool in rectum; fecal impaction cannot be excluded.

Stable diffuse bladder wall thickening and trabeculation, likely due
to neurogenic bladder. Tiny less than 5 mm bladder calculus again
noted.

Stable small uterine fibroid.

## 2024-06-23 ENCOUNTER — Other Ambulatory Visit (HOSPITAL_COMMUNITY): Payer: Self-pay | Admitting: Neurosurgery

## 2024-06-23 DIAGNOSIS — Z8589 Personal history of malignant neoplasm of other organs and systems: Secondary | ICD-10-CM

## 2024-07-14 ENCOUNTER — Ambulatory Visit (HOSPITAL_COMMUNITY)
Admission: RE | Admit: 2024-07-14 | Discharge: 2024-07-14 | Disposition: A | Discharge status: Home / Self Care | Source: Ambulatory Visit | Attending: Neurosurgery | Admitting: Neurosurgery

## 2024-07-14 DIAGNOSIS — Z8673 Personal history of transient ischemic attack (TIA), and cerebral infarction without residual deficits: Secondary | ICD-10-CM | POA: Diagnosis not present

## 2024-07-14 DIAGNOSIS — G9389 Other specified disorders of brain: Secondary | ICD-10-CM | POA: Diagnosis not present

## 2024-07-14 DIAGNOSIS — Z8589 Personal history of malignant neoplasm of other organs and systems: Secondary | ICD-10-CM | POA: Diagnosis present

## 2024-07-14 MED ORDER — GADOBUTROL 1 MMOL/ML IV SOLN
7.5000 mL | Freq: Once | INTRAVENOUS | Status: AC | PRN
  Administered 2024-07-14: 7.5 mL via INTRAVENOUS

## 2024-09-20 DEATH — deceased
